# Patient Record
Sex: Male | Born: 1950
Health system: Southern US, Community
[De-identification: ages and names within clinical notes are randomized; demographics above are authoritative.]

## PROBLEM LIST (undated history)

## (undated) DIAGNOSIS — K912 Postsurgical malabsorption, not elsewhere classified: Secondary | ICD-10-CM

## (undated) DIAGNOSIS — A419 Sepsis, unspecified organism: Secondary | ICD-10-CM

## (undated) DIAGNOSIS — I509 Heart failure, unspecified: Secondary | ICD-10-CM

## (undated) DIAGNOSIS — I442 Atrioventricular block, complete: Secondary | ICD-10-CM

## (undated) DIAGNOSIS — J9610 Chronic respiratory failure, unspecified whether with hypoxia or hypercapnia: Secondary | ICD-10-CM

## (undated) DIAGNOSIS — M79604 Pain in right leg: Secondary | ICD-10-CM

## (undated) DIAGNOSIS — I4892 Unspecified atrial flutter: Secondary | ICD-10-CM

## (undated) DIAGNOSIS — L0390 Cellulitis, unspecified: Secondary | ICD-10-CM

## (undated) DIAGNOSIS — G4733 Obstructive sleep apnea (adult) (pediatric): Secondary | ICD-10-CM

## (undated) DIAGNOSIS — T8579XA Infection and inflammatory reaction due to other internal prosthetic devices, implants and grafts, initial encounter: Secondary | ICD-10-CM

## (undated) DIAGNOSIS — R0902 Hypoxemia: Secondary | ICD-10-CM

## (undated) DIAGNOSIS — E875 Hyperkalemia: Secondary | ICD-10-CM

## (undated) DIAGNOSIS — R5381 Other malaise: Secondary | ICD-10-CM

## (undated) DIAGNOSIS — K432 Incisional hernia without obstruction or gangrene: Secondary | ICD-10-CM

## (undated) DIAGNOSIS — N183 Chronic kidney disease, stage 3 (moderate): Secondary | ICD-10-CM

## (undated) DIAGNOSIS — R001 Bradycardia, unspecified: Secondary | ICD-10-CM

## (undated) DIAGNOSIS — K469 Unspecified abdominal hernia without obstruction or gangrene: Secondary | ICD-10-CM

## (undated) DIAGNOSIS — K5909 Other constipation: Secondary | ICD-10-CM

## (undated) DIAGNOSIS — K632 Fistula of intestine: Secondary | ICD-10-CM

## (undated) DIAGNOSIS — L0291 Cutaneous abscess, unspecified: Secondary | ICD-10-CM

## (undated) DIAGNOSIS — Z950 Presence of cardiac pacemaker: Secondary | ICD-10-CM

## (undated) DIAGNOSIS — Z7901 Long term (current) use of anticoagulants: Secondary | ICD-10-CM

## (undated) DIAGNOSIS — N19 Unspecified kidney failure: Secondary | ICD-10-CM

## (undated) DIAGNOSIS — I5042 Chronic combined systolic (congestive) and diastolic (congestive) heart failure: Secondary | ICD-10-CM

## (undated) DIAGNOSIS — E876 Hypokalemia: Secondary | ICD-10-CM

## (undated) DIAGNOSIS — I10 Essential (primary) hypertension: Secondary | ICD-10-CM

## (undated) DIAGNOSIS — N179 Acute kidney failure, unspecified: Secondary | ICD-10-CM

## (undated) DIAGNOSIS — L97429 Non-pressure chronic ulcer of left heel and midfoot with unspecified severity: Secondary | ICD-10-CM

## (undated) DIAGNOSIS — J9620 Acute and chronic respiratory failure, unspecified whether with hypoxia or hypercapnia: Secondary | ICD-10-CM

## (undated) DIAGNOSIS — E785 Hyperlipidemia, unspecified: Secondary | ICD-10-CM

## (undated) DIAGNOSIS — I4891 Unspecified atrial fibrillation: Secondary | ICD-10-CM

## (undated) DIAGNOSIS — L03116 Cellulitis of left lower limb: Secondary | ICD-10-CM

## (undated) DIAGNOSIS — G629 Polyneuropathy, unspecified: Secondary | ICD-10-CM

## (undated) DIAGNOSIS — N189 Chronic kidney disease, unspecified: Secondary | ICD-10-CM

## (undated) DIAGNOSIS — Z9114 Patient's other noncompliance with medication regimen: Secondary | ICD-10-CM

## (undated) DIAGNOSIS — J42 Unspecified chronic bronchitis: Secondary | ICD-10-CM

## (undated) DIAGNOSIS — I714 Abdominal aortic aneurysm, without rupture: Secondary | ICD-10-CM

## (undated) DIAGNOSIS — L89153 Pressure ulcer of sacral region, stage 3: Secondary | ICD-10-CM

## (undated) DIAGNOSIS — E119 Type 2 diabetes mellitus without complications: Secondary | ICD-10-CM

## (undated) DIAGNOSIS — J9621 Acute and chronic respiratory failure with hypoxia: Secondary | ICD-10-CM

## (undated) HISTORY — DX: Chronic kidney disease, unspecified: N18.9

## (undated) HISTORY — DX: Cutaneous abscess, unspecified: L02.91

## (undated) HISTORY — PX: APPENDECTOMY: SHX54

## (undated) HISTORY — DX: Pressure ulcer of sacral region, stage 3: L89.153

## (undated) HISTORY — DX: Incisional hernia without obstruction or gangrene: K43.2

## (undated) HISTORY — DX: Unspecified abdominal hernia without obstruction or gangrene: K46.9

## (undated) HISTORY — DX: Heart failure, unspecified: I50.9

## (undated) HISTORY — DX: Atrioventricular block, complete: I44.2

## (undated) HISTORY — PX: BOWEL RESECTION: SHX1257

## (undated) HISTORY — DX: Chronic respiratory failure, unspecified whether with hypoxia or hypercapnia: J96.10

## (undated) HISTORY — DX: Hypoxemia: R09.02

## (undated) HISTORY — DX: Cellulitis of left lower limb: L03.116

## (undated) HISTORY — DX: Obstructive sleep apnea (adult) (pediatric): G47.33

## (undated) HISTORY — DX: Acute and chronic respiratory failure, unspecified whether with hypoxia or hypercapnia: J96.20

## (undated) HISTORY — PX: TONSILLECTOMY AND ADENOIDECTOMY: SUR1326

## (undated) HISTORY — DX: Other constipation: K59.09

## (undated) HISTORY — DX: Chronic combined systolic (congestive) and diastolic (congestive) heart failure: I50.42

## (undated) HISTORY — DX: Polyneuropathy, unspecified: G62.9

## (undated) HISTORY — DX: Non-pressure chronic ulcer of left heel and midfoot with unspecified severity: L97.429

## (undated) HISTORY — DX: Postsurgical malabsorption, not elsewhere classified: K91.2

## (undated) HISTORY — DX: Bradycardia, unspecified: R00.1

## (undated) HISTORY — DX: Hyperkalemia: E87.5

## (undated) HISTORY — PX: NASAL SEPTUM SURGERY: SHX37

## (undated) HISTORY — DX: Morbid (severe) obesity due to excess calories: E66.01

## (undated) HISTORY — DX: Other malaise: R53.81

## (undated) HISTORY — PX: INCISIONAL HERNIA REPAIR: SHX193

## (undated) HISTORY — DX: Unspecified atrial flutter: I48.92

## (undated) HISTORY — DX: Type 2 diabetes mellitus without complications: E11.9

## (undated) HISTORY — PX: KNEE SURGERY: SHX244

## (undated) HISTORY — DX: Acute kidney failure, unspecified: N17.9

## (undated) HISTORY — DX: Long term (current) use of anticoagulants: Z79.01

## (undated) HISTORY — DX: Hypokalemia: E87.6

## (undated) HISTORY — DX: Essential (primary) hypertension: I10

## (undated) HISTORY — DX: Cellulitis, unspecified: L03.90

## (undated) HISTORY — PX: OSTOMY TAKE DOWN: SHX2142

## (undated) HISTORY — DX: Unspecified kidney failure: N19

## (undated) HISTORY — DX: Pain in right leg: M79.604

## (undated) HISTORY — DX: Acute and chronic respiratory failure with hypoxia: J96.21

## (undated) HISTORY — DX: Sepsis, unspecified organism: A41.9

## (undated) HISTORY — DX: Patient's other noncompliance with medication regimen: Z91.14

## (undated) HISTORY — DX: Chronic kidney disease, stage 3 (moderate): N18.3

## (undated) HISTORY — DX: Presence of cardiac pacemaker: Z95.0

## (undated) HISTORY — DX: Unspecified chronic bronchitis: J42

---

## 1999-01-09 ENCOUNTER — Encounter (HOSPITAL_BASED_OUTPATIENT_CLINIC_OR_DEPARTMENT_OTHER): Payer: Self-pay | Admitting: General Surgery

## 1999-01-10 ENCOUNTER — Inpatient Hospital Stay (HOSPITAL_COMMUNITY): Admission: RE | Admit: 1999-01-10 | Discharge: 1999-01-18 | Payer: Self-pay | Admitting: General Surgery

## 1999-01-13 ENCOUNTER — Encounter (HOSPITAL_BASED_OUTPATIENT_CLINIC_OR_DEPARTMENT_OTHER): Payer: Self-pay | Admitting: General Surgery

## 1999-07-02 ENCOUNTER — Emergency Department (HOSPITAL_COMMUNITY): Admission: EM | Admit: 1999-07-02 | Discharge: 1999-07-02 | Payer: Self-pay | Admitting: Emergency Medicine

## 1999-07-02 ENCOUNTER — Encounter: Payer: Self-pay | Admitting: Emergency Medicine

## 2000-08-25 ENCOUNTER — Encounter: Payer: Self-pay | Admitting: *Deleted

## 2000-08-25 ENCOUNTER — Emergency Department (HOSPITAL_COMMUNITY): Admission: EM | Admit: 2000-08-25 | Discharge: 2000-08-25 | Payer: Self-pay | Admitting: Emergency Medicine

## 2001-10-20 ENCOUNTER — Inpatient Hospital Stay (HOSPITAL_COMMUNITY): Admission: EM | Admit: 2001-10-20 | Discharge: 2001-10-23 | Payer: Self-pay | Admitting: Emergency Medicine

## 2001-10-20 ENCOUNTER — Encounter: Payer: Self-pay | Admitting: Emergency Medicine

## 2002-07-06 ENCOUNTER — Encounter: Payer: Self-pay | Admitting: Pulmonary Disease

## 2002-07-06 ENCOUNTER — Ambulatory Visit (HOSPITAL_COMMUNITY): Admission: RE | Admit: 2002-07-06 | Discharge: 2002-07-06 | Payer: Self-pay | Admitting: Pulmonary Disease

## 2002-12-16 ENCOUNTER — Ambulatory Visit (HOSPITAL_BASED_OUTPATIENT_CLINIC_OR_DEPARTMENT_OTHER): Admission: RE | Admit: 2002-12-16 | Discharge: 2002-12-16 | Payer: Self-pay | Admitting: Internal Medicine

## 2003-09-12 ENCOUNTER — Inpatient Hospital Stay (HOSPITAL_COMMUNITY): Admission: EM | Admit: 2003-09-12 | Discharge: 2003-09-16 | Payer: Self-pay | Admitting: Emergency Medicine

## 2003-09-13 ENCOUNTER — Encounter (INDEPENDENT_AMBULATORY_CARE_PROVIDER_SITE_OTHER): Payer: Self-pay | Admitting: Cardiology

## 2004-01-16 ENCOUNTER — Emergency Department (HOSPITAL_COMMUNITY): Admission: EM | Admit: 2004-01-16 | Discharge: 2004-01-16 | Payer: Self-pay | Admitting: Emergency Medicine

## 2005-01-27 ENCOUNTER — Inpatient Hospital Stay (HOSPITAL_COMMUNITY): Admission: AD | Admit: 2005-01-27 | Discharge: 2005-01-29 | Payer: Self-pay | Admitting: Emergency Medicine

## 2005-01-28 ENCOUNTER — Ambulatory Visit: Payer: Self-pay | Admitting: Internal Medicine

## 2005-08-02 ENCOUNTER — Encounter: Admission: RE | Admit: 2005-08-02 | Discharge: 2005-08-02 | Payer: Self-pay | Admitting: Endocrinology

## 2005-08-15 ENCOUNTER — Encounter: Admission: RE | Admit: 2005-08-15 | Discharge: 2005-09-17 | Payer: Self-pay | Admitting: Endocrinology

## 2005-09-18 ENCOUNTER — Encounter: Admission: RE | Admit: 2005-09-18 | Discharge: 2005-12-17 | Payer: Self-pay | Admitting: Endocrinology

## 2006-07-12 ENCOUNTER — Emergency Department (HOSPITAL_COMMUNITY): Admission: EM | Admit: 2006-07-12 | Discharge: 2006-07-12 | Payer: Self-pay | Admitting: Emergency Medicine

## 2007-06-09 ENCOUNTER — Inpatient Hospital Stay (HOSPITAL_COMMUNITY): Admission: EM | Admit: 2007-06-09 | Discharge: 2007-06-19 | Payer: Self-pay | Admitting: Emergency Medicine

## 2007-06-13 ENCOUNTER — Encounter: Payer: Self-pay | Admitting: Cardiology

## 2007-06-15 ENCOUNTER — Ambulatory Visit: Payer: Self-pay | Admitting: Internal Medicine

## 2007-06-18 HISTORY — PX: OTHER SURGICAL HISTORY: SHX169

## 2007-06-19 ENCOUNTER — Encounter: Payer: Self-pay | Admitting: Internal Medicine

## 2007-06-26 ENCOUNTER — Ambulatory Visit: Payer: Self-pay | Admitting: Pulmonary Disease

## 2007-07-06 ENCOUNTER — Ambulatory Visit (HOSPITAL_BASED_OUTPATIENT_CLINIC_OR_DEPARTMENT_OTHER): Admission: RE | Admit: 2007-07-06 | Discharge: 2007-07-06 | Payer: Self-pay | Admitting: Pulmonary Disease

## 2007-07-06 ENCOUNTER — Ambulatory Visit: Payer: Self-pay | Admitting: Pulmonary Disease

## 2007-09-01 DIAGNOSIS — Z794 Long term (current) use of insulin: Secondary | ICD-10-CM

## 2007-09-01 DIAGNOSIS — G4733 Obstructive sleep apnea (adult) (pediatric): Secondary | ICD-10-CM

## 2007-09-01 DIAGNOSIS — J42 Unspecified chronic bronchitis: Secondary | ICD-10-CM

## 2007-09-01 DIAGNOSIS — R0902 Hypoxemia: Secondary | ICD-10-CM | POA: Insufficient documentation

## 2007-09-01 DIAGNOSIS — E785 Hyperlipidemia, unspecified: Secondary | ICD-10-CM

## 2007-09-01 DIAGNOSIS — E1165 Type 2 diabetes mellitus with hyperglycemia: Secondary | ICD-10-CM

## 2007-09-01 DIAGNOSIS — E66813 Obesity, class 3: Secondary | ICD-10-CM | POA: Insufficient documentation

## 2007-09-01 DIAGNOSIS — I509 Heart failure, unspecified: Secondary | ICD-10-CM | POA: Insufficient documentation

## 2007-09-01 HISTORY — DX: Obstructive sleep apnea (adult) (pediatric): G47.33

## 2007-09-01 HISTORY — DX: Unspecified chronic bronchitis: J42

## 2007-09-01 HISTORY — DX: Morbid (severe) obesity due to excess calories: E66.01

## 2007-09-01 HISTORY — DX: Obesity, class 3: E66.813

## 2007-11-18 ENCOUNTER — Telehealth: Payer: Self-pay | Admitting: Pulmonary Disease

## 2008-01-02 ENCOUNTER — Emergency Department (HOSPITAL_COMMUNITY): Admission: EM | Admit: 2008-01-02 | Discharge: 2008-01-02 | Payer: Self-pay | Admitting: Emergency Medicine

## 2008-03-08 ENCOUNTER — Telehealth (INDEPENDENT_AMBULATORY_CARE_PROVIDER_SITE_OTHER): Payer: Self-pay | Admitting: *Deleted

## 2008-03-15 ENCOUNTER — Telehealth: Payer: Self-pay | Admitting: Pulmonary Disease

## 2008-04-28 ENCOUNTER — Ambulatory Visit: Payer: Self-pay | Admitting: Pulmonary Disease

## 2009-01-27 ENCOUNTER — Encounter (INDEPENDENT_AMBULATORY_CARE_PROVIDER_SITE_OTHER): Payer: Self-pay | Admitting: *Deleted

## 2009-05-26 ENCOUNTER — Encounter: Payer: Self-pay | Admitting: Pulmonary Disease

## 2009-06-06 ENCOUNTER — Encounter: Payer: Self-pay | Admitting: Pulmonary Disease

## 2009-10-04 ENCOUNTER — Encounter (INDEPENDENT_AMBULATORY_CARE_PROVIDER_SITE_OTHER): Payer: Self-pay | Admitting: *Deleted

## 2009-10-08 ENCOUNTER — Emergency Department (HOSPITAL_COMMUNITY): Admission: EM | Admit: 2009-10-08 | Discharge: 2009-10-08 | Payer: Self-pay | Admitting: Pediatric Emergency Medicine

## 2009-10-19 ENCOUNTER — Encounter: Admission: RE | Admit: 2009-10-19 | Discharge: 2009-10-19 | Payer: Self-pay | Admitting: Endocrinology

## 2009-12-12 ENCOUNTER — Ambulatory Visit (HOSPITAL_COMMUNITY): Admission: RE | Admit: 2009-12-12 | Discharge: 2009-12-12 | Payer: Self-pay | Admitting: Neurosurgery

## 2010-01-02 ENCOUNTER — Inpatient Hospital Stay (HOSPITAL_COMMUNITY): Admission: EM | Admit: 2010-01-02 | Discharge: 2010-01-04 | Payer: Self-pay | Admitting: Emergency Medicine

## 2010-03-01 ENCOUNTER — Encounter: Admission: RE | Admit: 2010-03-01 | Discharge: 2010-03-01 | Payer: Self-pay | Admitting: Orthopedic Surgery

## 2010-03-26 ENCOUNTER — Encounter (HOSPITAL_BASED_OUTPATIENT_CLINIC_OR_DEPARTMENT_OTHER): Admission: RE | Admit: 2010-03-26 | Discharge: 2010-06-24 | Payer: Self-pay | Admitting: General Surgery

## 2010-03-27 ENCOUNTER — Ambulatory Visit (HOSPITAL_COMMUNITY): Admission: RE | Admit: 2010-03-27 | Discharge: 2010-03-27 | Payer: Self-pay | Admitting: General Surgery

## 2010-04-13 ENCOUNTER — Ambulatory Visit: Payer: Self-pay | Admitting: Vascular Surgery

## 2010-05-07 ENCOUNTER — Encounter (INDEPENDENT_AMBULATORY_CARE_PROVIDER_SITE_OTHER): Payer: Self-pay | Admitting: *Deleted

## 2010-06-26 ENCOUNTER — Encounter (HOSPITAL_BASED_OUTPATIENT_CLINIC_OR_DEPARTMENT_OTHER)
Admission: RE | Admit: 2010-06-26 | Discharge: 2010-08-24 | Payer: Self-pay | Source: Home / Self Care | Admitting: General Surgery

## 2010-10-28 ENCOUNTER — Encounter: Payer: Self-pay | Admitting: Neurosurgery

## 2010-11-06 NOTE — Letter (Signed)
Summary: Device-Delinquent Check  [place], [place] [address] [place]   [place], [place] [address]   Phone: [phone]  Fax: [phone]     May 07, 2010 MRN: [id number]   Kurt Cross [address] [place],   [address]   [name],  According to our records, you have not had your implanted device checked in the recommended period of time.  We are unable to determine appropriate device function without checking your device on a regular basis.  Please call our office to schedule an appointment as soon as possible.  If you are having your device checked by another physician, please call us so that we may update our records.  Thank you,   [facility]

## 2010-11-06 NOTE — Letter (Signed)
Summary: Device-Delinquent Check  [facility], Main Office  [address]   [place], [place] [address]   Phone: [phone]  Fax: [phone]     October 04, 2009 MRN: [id number]   KHUP MERRIWEATHER [address] [place], [place]  [address]   Dear Mr. RIDENHOUR,  According to our records, you have not had your implanted device checked in the recommended period of time.  We are unable to determine appropriate device function without checking your device on a regular basis.  Please call our office to schedule an appointment as soon as possible.  If you are having your device checked by another physician, please call us so that we may update our records.  Thank you,   [facility]

## 2010-11-06 NOTE — Miscellaneous (Signed)
Summary: Order for Respiratory Eval/[facility]  Order for Respiratory Eval/[facility]   Imported By: Sherian Rein 05/29/2009 13:26:33  _____________________________________________________________________  External Attachment:    Type:   Image     Comment:   External Document

## 2010-11-06 NOTE — Progress Notes (Signed)
Summary: medicare beneficiary statement- requested chart.  Phone Note From Other Clinic   Caller: [facility] Call For: sood Summary of Call: caller has been trying to get a letter for " medicare benificiary statement" signed since 2 / 09. says the letter is either in chart or a nurse may have it. however, she is faxing a new request/ form to be signed asap. [phone]. Initial call taken by: Tivis Ringer,  March 08, 2008 2:28 PM  Follow-up for Phone Call        requested chart. Cyndia Diver LPN  March 09, 4539 2:37 PM   Additional Follow-up for Phone Call Additional follow up Details #1::        Chart was placed in sood lookat with paperwork this am.  Additional Follow-up by: Vernie Murders,  March 09, 2008 9:45 AM

## 2010-11-06 NOTE — Assessment & Plan Note (Signed)
Summary: go over sleep study/apc   Visit Type:  Follow-up PCP:  Kurt Cross  Chief Complaint:  Sleep study results .  History of Present Illness: I saw Kurt Cross in follow up for his Severe OSA.  He is currently on BiPAP at 16/12 with a full face mask.  He feels this helps alot with his sleep.  He has increased energy during the day.  He has lost 40 lbs since starting an exercise program.  He recently had a pacemaker inserted.      Current Allergies: No known allergies   Past Medical History:    DM    HTN    CHF    s/p Pacemaker    Severe OSA, on BiPAP 16/12    Morbid obesity    Dyslipidemia  Past Surgical History:    Exploratory lap for colon perforation    Rhinoplasty    Appendectomy    Ventral hernia repair   Family History:    Mother - ETOH    Daughter - Allergies  Social History:    Married.  Disabled.  Quit drinking in 2007.  Quit smoking in 2008.  Has 7 children.     Vital Signs:  Patient Profile:   60 Years Old Male Height:     70 inches Weight:      333.13 pounds O2 Sat:      95 % O2 treatment:    Room Air Temp:     98.1 degrees F oral Pulse rate:   76 / minute BP sitting:   122 / 80  (left arm) Cuff size:   regular  Vitals Entered ByMarinus Cross (April 28, 2008 3:05 PM)             Is Patient Diabetic? Yes  Comments Medications reviewed with patient Kurt Cross  April 28, 2008 3:09 PM      Physical Exam  General:     obese.  obese.   Nose:     no sinus tenderness Mouth:     no oral lesion, poor dentition Neck:     no LAN Lungs:     decreased breath sounds, no wheezing Heart:     regular rhythm, normal rate, and no murmurs.  regular rhythm, normal rate, and no murmurs.   Abdomen:     obese, soft Extremities:     no edema    Pulmonary Function Test Height (in.): 70 Gender: Male    Impression & Recommendations:  Problem # 1:  OBSTRUCTIVE SLEEP APNEA (ICD-327.23) Continue on BiPAP 16/12.  I will get  a copy of [facility] download from [facility]. Orders: DME Referral (DME) Est. Patient Level II ([address])   Medications Added to Medication List This Visit: 1)  Furosemide 40 Mg Tabs (Furosemide) .... Take one tablet two times a day 2)  Norvasc 10 Mg Tabs (Amlodipine besylate) .... Take one tablet daily. 3)  Klor-con M20 20 Meq Cr-tabs (Potassium chloride crys cr) .... Take one tablet daily.  Complete Medication List: 1)  Furosemide 40 Mg Tabs (Furosemide) .... Take one tablet two times a day 2)  Clonidine Hcl 0.2 Mg Tabs (Clonidine hcl) .... Take 1/2 tab by mouth two times a day 3)  Aldactone 100 Mg Tabs (Spironolactone) .... Take by mouth once daily 4)  Hydralazine Hcl 25 Mg Tabs (Hydralazine hcl) .... Take one tab by mouth three times a day 5)  Coreg 6.25 Mg Tabs (Carvedilol) .... Take one tab by mouth two times a day  6)  Lotensin 40 Mg Tabs (Benazepril hcl) .... Take by mouth once daily 7)  Crestor 10 Mg Tabs (Rosuvastatin calcium) .... Take by mouth once daily 8)  Novolog 100 Unit/ml Soln (Insulin aspart) .... Use as directed 9)  Humulin N 100 Unit/ml Susp (Insulin isophane human) .... Use as directed 10)  Norvasc 10 Mg Tabs (Amlodipine besylate) .... Take one tablet daily. 11)  Klor-con M20 20 Meq Cr-tabs (Potassium chloride crys cr) .... Take one tablet daily.   Patient Instructions: 1)  Follow up in 6 months   ]

## 2010-12-30 LAB — UIFE/LIGHT CHAINS/TP QN, 24-HR UR
Alpha 1, Urine: DETECTED — AB
Alpha 2, Urine: DETECTED — AB
Beta, Urine: DETECTED — AB
Free Kappa Lt Chains,Ur: 9.95 mg/dL — ABNORMAL HIGH (ref 0.04–1.51)
Free Kappa/Lambda Ratio: 15.31 ratio — ABNORMAL HIGH (ref 0.46–4.00)
Free Lt Chn Excr Rate: 248.75 mg/d

## 2010-12-30 LAB — DIFFERENTIAL
Basophils Absolute: 0.3 10*3/uL — ABNORMAL HIGH (ref 0.0–0.1)
Basophils Relative: 2 % — ABNORMAL HIGH (ref 0–1)
Eosinophils Relative: 1 % (ref 0–5)
Eosinophils Relative: 2 % (ref 0–5)
Lymphocytes Relative: 23 % (ref 12–46)
Lymphocytes Relative: 23 % (ref 12–46)
Lymphs Abs: 2.2 10*3/uL (ref 0.7–4.0)
Lymphs Abs: 2.4 10*3/uL (ref 0.7–4.0)
Monocytes Absolute: 1 10*3/uL (ref 0.1–1.0)
Neutrophils Relative %: 66 % (ref 43–77)

## 2010-12-30 LAB — COMPREHENSIVE METABOLIC PANEL
AST: 14 U/L (ref 0–37)
AST: 17 U/L (ref 0–37)
Albumin: 3.3 g/dL — ABNORMAL LOW (ref 3.5–5.2)
Alkaline Phosphatase: 20 U/L — ABNORMAL LOW (ref 39–117)
CO2: 27 mEq/L (ref 19–32)
Calcium: 8.8 mg/dL (ref 8.4–10.5)
Chloride: 100 mEq/L (ref 96–112)
Creatinine, Ser: 1.43 mg/dL (ref 0.4–1.5)
GFR calc Af Amer: 56 mL/min — ABNORMAL LOW (ref >60)
GFR calc Af Amer: >60 mL/min (ref >60)
GFR calc non Af Amer: 51 mL/min — ABNORMAL LOW (ref >60)
Potassium: 4.7 mEq/L (ref 3.5–5.1)
Total Bilirubin: 0.9 mg/dL (ref 0.3–1.2)

## 2010-12-30 LAB — GLUCOSE, CAPILLARY
Glucose-Capillary: 149 mg/dL — ABNORMAL HIGH (ref 70–99)
Glucose-Capillary: 152 mg/dL — ABNORMAL HIGH (ref 70–99)
Glucose-Capillary: 159 mg/dL — ABNORMAL HIGH (ref 70–99)
Glucose-Capillary: 185 mg/dL — ABNORMAL HIGH (ref 70–99)
Glucose-Capillary: 299 mg/dL — ABNORMAL HIGH (ref 70–99)

## 2010-12-30 LAB — SEDIMENTATION RATE: Sed Rate: 39 mm/hr — ABNORMAL HIGH (ref 0–16)

## 2010-12-30 LAB — CBC
HCT: 46.3 % (ref 39.0–52.0)
Hemoglobin: 16.1 g/dL (ref 13.0–17.0)
MCHC: 33.9 g/dL (ref 30.0–36.0)
MCV: 100.1 fL — ABNORMAL HIGH (ref 78.0–100.0)
MCV: 101.4 fL — ABNORMAL HIGH (ref 78.0–100.0)
RBC: 4.29 MIL/uL (ref 4.22–5.81)
RDW: 13.5 % (ref 11.5–15.5)

## 2010-12-30 LAB — HEMOGLOBINOPATHY EVALUATION
Hemoglobin Other: 0 % (ref 0.0–0.0)
Hgb A2 Quant: 2.6 % (ref 2.2–3.2)
Hgb A: 97.4 % (ref 96.8–97.8)

## 2010-12-30 LAB — POCT I-STAT, CHEM 8
Chloride: 105 mEq/L (ref 96–112)
HCT: 50 % (ref 39.0–52.0)
Hemoglobin: 17 g/dL (ref 13.0–17.0)
Potassium: 4 mEq/L (ref 3.5–5.1)
Sodium: 138 mEq/L (ref 135–145)

## 2010-12-30 LAB — HEPATIC FUNCTION PANEL
Albumin: 3.4 g/dL — ABNORMAL LOW (ref 3.5–5.2)
Alkaline Phosphatase: 30 U/L — ABNORMAL LOW (ref 39–117)
Indirect Bilirubin: 0.9 mg/dL (ref 0.3–0.9)
Total Protein: 7.3 g/dL (ref 6.0–8.3)

## 2010-12-30 LAB — VITAMIN B12: Vitamin B-12: 273 pg/mL (ref 211–911)

## 2010-12-30 LAB — ANA: Anti Nuclear Antibody(ANA): NEGATIVE

## 2010-12-30 LAB — T4, FREE: Free T4: 0.92 ng/dL (ref 0.80–1.80)

## 2010-12-30 LAB — HEMOGLOBIN A1C
Hgb A1c MFr Bld: 7.6 % — ABNORMAL HIGH (ref 4.6–6.1)
Mean Plasma Glucose: 171 mg/dL

## 2010-12-30 LAB — SODIUM, URINE, RANDOM: Sodium, Ur: 119 mEq/L

## 2010-12-30 LAB — PHOSPHORUS: Phosphorus: 4.2 mg/dL (ref 2.3–4.6)

## 2010-12-30 LAB — FOLATE: Folate: 6.3 ng/mL

## 2010-12-30 LAB — MAGNESIUM: Magnesium: 1.8 mg/dL (ref 1.5–2.5)

## 2011-02-19 NOTE — Op Note (Signed)
NAMESPARROW, UHLENHAKE NO.:  [id number]   MEDICAL RECORD NO.:  [id number]          PATIENT TYPE:  INP   LOCATION:  3732                         FACILITY:  [facility]   PHYSICIAN:  Champ Mungo. Lovena Le, MD    DATE OF BIRTH:  01-22-1951   DATE OF PROCEDURE:  06/18/2007  DATE OF DISCHARGE:                               OPERATIVE REPORT   PROCEDURE PERFORMED:  Implantation of a biventricular pacemaker.   INDICATIONS:  Nonischemic cardiomyopathy, EF 35-40%, congestive heart  failure with intermittent complete heart block.   INTRODUCTION:  The patient is 60 year old man with history of COPD and  sleep apnea as well as a history of intermittent complete heart block as  well as a history of congestive heart failure with an EF of 35-40%.  He  is admitted to hospital and found to have symptomatic intermittent heart  block.  He is now referred for bi-V pacemaker implantation.   PROCEDURE:  After informed consent obtained, the patient taken  diagnostic EP lab in fasting state.  After usual preparation and  draping, intravenous fentanyl and Midazolam was given for sedation.  30  mL lidocaine was infiltrated over the infraclavicular region.  A 7 cm  incision was carried out over this region.  Electrocautery utilized to  dissect down to the fascial plane.  The left subclavian vein was sharply  punctured x3 and the Medtronic model 5076 58-cm active fixation pacing  lead serial [id number] was advanced into the right ventricle.  The  Medtronic model 5076 52-cm active fixation pacing lead serial number  [id number] advanced to the right atrium.  Mapping was carried out the  right ventricle.  At the final site on the RV septum the R-waves  measured 21 mV and the pacing threshold 0.6 volts at 0.5 milliseconds  with the lead actively fixed.  Pace impedance was 811 ohms.  10 volts  pacing did not stimulate diaphragm.  With this satisfactory parameter  the attention then turned placement atrial  lead which was placed in  anterolateral wall the right atrium where P-waves were 2 mV and the  pacing threshold was 1.2 volts of 0.5 milliseconds.  Again 10 volts  pacing did not stimulate the diaphragm.  The pacing impedance was 733  ohms.  With these satisfactory parameters, attention then turned  placement the left ventricular lead.  Coronary sinus guiding catheter  was advanced into the right atrium and the coronary sinus was cannulated  without difficulty.  Venography was carried out demonstrating a nice  lateral vein which was used for LV lead placement.  The Medtronic model  4194 88-cm bipolar lead serial number [id number] was advanced into the  lateral vein where the pacing threshold was to 2.5 volt at 0.5  milliseconds.  The pace impedance 854 ohms and there was no  diaphragmatic stimulation noted.  After this was confirmed the leads  were secured to subpectoralis fascia with a figure-of-eight silk suture.  The sewing sleeve also secured with silk suture.  Electrocautery  utilized to make subcutaneous pocket.  Kanamycin irrigation was utilized  to irrigate  the pocket.  Electrocautery utilized to assure hemostasis.  The Medtronic Insync III model [id number] dual-chamber biventricular pacemaker  serial number [id number] was connected to the atrium.  Atrial and  ventricular leads were placed back in the subcutaneous pocket.  Generator secured with silk suture.  Additional kanamycin was then  utilized to irrigate the pocket.  The incision closed with layer of 2-0  Vicryl followed by layer of 3-0 Vicryl followed by layer of 4-0 Vicryl.  Benzoin painted on the skin, Steri-Strips were applied and pressure  dressing placed.  The patient was returned to his room in satisfactory  condition.   COMPLICATIONS:  There were no immediate procedure complications.   RESULTS:  Demonstrate successful implantation of Medtronic dual-chamber  pacemaker in a patient with intermittent complete heart block,  dilated  cardiomyopathy, EF 35-40% and congestive heart failure.      Champ Mungo. Lovena Le, MD  Electronically Signed     GWT/MEDQ  D:  06/18/2007  T:  06/19/2007  Job:  [id number]   cc:   Elayne Snare, M.D.

## 2011-02-19 NOTE — Procedures (Signed)
NAME:  Kurt Cross, Kurt Cross NO.:  [id number]   MEDICAL RECORD NO.:  [id number]          PATIENT TYPE:  OUT   LOCATION:  SLEEP CENTER                 FACILITY:  [facility]   PHYSICIAN:  Chesley Mires, MD        DATE OF BIRTH:  12-23-1950   DATE OF STUDY:                            NOCTURNAL POLYSOMNOGRAM   REFERRING PHYSICIAN:   REFERRING PHYSICIAN:  Chesley Mires, MD.   INDICATION:  This is an individual, who has a history of sleep apnea  with an overnight polysomnogram from December 16, 2002, showing an  apnea/hypopnea index of 72 with an oxygen saturation nadir of 70%.  He  has been tried on CPAP but was unable to tolerate this.  He returned to  the Sleep Lab for a BiPAP titration study.   EPWORTH SLEEPINESS SCORE:  Twenty.   MEDICATIONS:  Insulin, Spironolactone, Carvedilol, Hydralazine,  Furosemide, and Benazepril.   SLEEP ARCHITECTURE:  Total recording time was 386 minutes.  Total sleep  time was 313 minutes.  Sleep efficiency is 81%.  Sleep latency is 2.5  minutes, which is significantly reduced.  REM latency is 56 minutes.  The study was notable for lack of slow-wave sleep.  The patient slept in  both the supine and non-supine positions.   RESPIRATORY DATA:  The average respiratory rate was 18.  The patient was  titrated from a BiPAP pressure setting of 8/4 to 16/12.  At a BiPAP  pressure setting of 16/12, the apnea/hypopnea index was 0.  At this  pressure setting, snoring was eliminated and the patient was observed in  REM sleep; however, he was not observed in supine sleep.  Of note is  that at lower pressure settings on his BiPAP, he continued to have  significant mild respiratory events, which were both central obstructive  and mixed while in the supine position but not in the non-supine  position.   OXYGEN DATA:  The baseline oxygenation was 72%, the oxygen saturation  nadir was 79%.  At a BiPAP pressure setting of 16/12, the mean  oxygenation during non-REM  sleep was 93%, the mean oxygenation during  REM sleep was 92%, the minimal oxygenation during non-REM sleep was 89%,  and the minimal oxygenation during REM sleep was 89%.  The study was  done without the use of supplemental oxygen.   CARDIAC DATA:  The average heart rate was 76, and the rhythm strips  showed normal sinus rhythm with PVCs.   MOVEMENT-PARASOMNIA:  The periodic limb movement index was 0.  The  patient had one restroom trip.   IMPRESSION:  This was a bilevel positive airway pressure (BiPAP)  titration study.  At a bilevel positive airway pressure setting of  16/12, the apnea/hypopnea index was reduced to 0.  However, while the  patient was observed in rapid eye movement (REM) sleep at this pressure  setting, he was not observed in supine sleep.  Of note is that at lower  pressure settings, he continued to have a significant number of  respiratory events while in the supine position.  What I would recommend  is to set the patient  on bilevel positive airway pressure of 16/12 then  monitor him for his clinical response.  He may also benefit from the use  of positional therapy.  If he is still symptomatic after this, he may  need to undergo further titration study.  In addition, I would have the  patient undergo an overnight oximetry once he is established on bilevel  positive airway pressure to determine whether he would benefit from  supplemental oxygen as well.  __________      Chesley Mires, MD  Diplomat, American Board of Sleep Medicine  Electronically Signed     VS/MEDQ  D:  07/07/2007 16:19:34  T:  07/07/2007 22:35:18  Job:  [id number]

## 2011-02-19 NOTE — Op Note (Signed)
NAMELUCIAN, Cross NO.:  [id number]   MEDICAL RECORD NO.:  [id number]          PATIENT TYPE:  INP   LOCATION:  18                         FACILITY:  [facility]   PHYSICIAN:  Mohan N. Terrence Cross, M.D. DATE OF BIRTH:  March 21, 1951   DATE OF PROCEDURE:  06/15/2007  DATE OF DISCHARGE:                               OPERATIVE REPORT   PROCEDURE:  Left cardiac cath with selective left and right coronary  angiography, left ventriculography via right groin using Judkins  technique.   INDICATION FOR THE PROCEDURE:  [name] is a 60 year old black  male with past medical history significant for hypertension, insulin-  requiring diabetes mellitus, history of congestive heart failure,  obstructive sleep apnea, obesity-hypoventilation syndrome, morbid  obesity.  He was admitted on June 09, 2007 because of progressive  increasing shortness of breath associated with abdominal and leg  swelling for two weeks' duration.  Patient denies any chest pain,  nausea, vomiting or diaphoresis.  Denies noncompliance to medication or  diet.  Denies excessive salty food intake.  Patient does give a history  of PND, orthopnea.  Patient denies any palpitations, lightheadedness or  syncope.  Patient also complains of cough with brownish phlegm for last  few days.  Denies any fever or chills.  Denies any urinary complaints.  Patient was noted to be in mild congestive heart failure, responded to  IV diuretics.  Cardiology consult is called, as patient had  asymptomatic, second-degree 2:1 AV block and first-degree AV block and  for workup for CHF.   PAST MEDICAL HISTORY:  As above.   PAST SURGICAL HISTORY:  1. He had ventral hernia repair.  2. Had appendectomy in the past.  3. Had tonsillectomy in the past.  4. Had rhinoplasty in the past.   ALLERGIES:  No known drug allergies.   MEDICATIONS AT HOME:  He was on:  1. Clonidine 0.25 mg b.i.d.  2. Cardura 4 mg p.o. h.s.  3. Lasix 80  mg p.o. daily.  4. Crestor 10 mg p.o. daily.  5. Humulin N insulin 72 units in the morning, 62 in the evening.  6. NovoLog 12 in the morning and 12 in the evening.   SOCIAL HISTORY:  He is married, retired on disability, used to build gas  pumps, smoked two packs per day for six to seven years and then half-  pack per day for two years, quit one month ago, no history of alcohol  abuse.   FAMILY HISTORY:  Father died at young age, cause not known.  Mother died  of diabetic coma; she also had history of  alcohol abuse.   PHYSICAL EXAMINATION:  GENERAL:  He was alert, awake, oriented x3, no  acute distress,  VITAL SIGNS:  Blood pressure was 155/97, pulse was 89 regular.  HEENT:  Conjunctiva was pink.  NECK:  Supple.  No JVD.  LUNGS:  Decreased breath sounds at bases with occasional rhonchi and  rales.  CARDIOVASCULAR EXAM:  S1-S2 was normal.  There was a soft systolic  murmur and S3 gallop.  ABDOMEN:  Soft.  Bowel sounds were present.  Obese, nontender.  EXTREMITIES:  There was no clubbing, cyanosis.  There is 1+ edema.   LABS:  His cardiac enzymes were negative.   EKG showed normal sinus rhythm with first-degree AV block, nondiagnostic  Q waves in the inferior leads, poor R wave progression in V1-V4.   BRIEF HOSPITAL COURSE:  Patient was admitted to telemetry unit.  MI was  ruled out by serial enzymes and EKG.  Patient subsequently underwent  Persantine Myoview which showed anteroapical wall ischemia with EF of  45% and fixed defect in the inferior wall, most likely due to  diaphragmatic attenuation.  Due to recurrent congestive heart failure,  depressed LV systolic function, multiple risk factors and mildly  positive Persantine Myoview, discussed with patient regarding left cath,  possible PTCA and stenting, its risks and benefits, i.e. death, MI,  stroke, need for emergency CABG, risk of restenosis, local vascular  complications, etc. and consented for PCI.   PROCEDURE:   After obtaining the informed consent, patient was brought to  the cath lab and was placed on fluoroscopy table.  The right groin was  prepped and draped in usual fashion, 2% Xylocaine was used for local  anesthesia in the right groin.  With the help of thin-wall needle, 6-  French arterial sheath was placed.  The sheath was aspirated and  flushed.   A 6-French left Judkins catheter was advanced over the wire under  fluoroscopic guidance up to the ascending aorta; wire was pulled out;  the cath was aspirated and connected to the manifold; cath was further  advanced and engaged into left coronary ostium.  Multiple views of the  left system were taken.   The catheter was disengaged and was pulled out over the wire and was  replaced with a 6-French right Judkins catheter which was advanced over  the wire under fluoroscopic guidance up to the ascending aorta; wire was  pulled out; the cath was aspirated and connected to the manifold;  catheter was further advanced and engaged into right coronary ostium.  Multiple views of the right system were taken.   The catheter was disengaged and was pulled out over the wire and was  replaced with a 6-French catheter which was advanced over the wire under  fluoroscopic guidance up to the ascending aorta; wire was pulled out;  the catheter was aspirated and connected to the manifold; catheter was  further advanced across the aortic valve into the LV.  LV pressures were  recorded.   Left ventriculography was done in 30-degree RAO position.  Post  angiographic pressures were recorded from LV and then pullback pressures  were recorded from the aorta.  There was no gradient across the aortic  valve.   The pigtail catheter was pulled out over the wires; sheaths were  aspirated and flushed.   FINDINGS:  1. Left ventricle was mildly enlarged.  2. Mild left ventricular hypertrophy.  3. Ejection fraction of 35 to 40%.  4. Left main was long which was  patent.  5. LAD was patent.  6. Diagonal 1 was moderate size which has 40% ostial stenosis.  7. Left circumflex was patent.  8. OM1 was very small which was patent.  9. OM2 was moderate size which was patent.  10.RCA was large which was patent.  11.PDA was moderate size which was patent.  12.PLA branches was small which has mild disease.   The patient tolerated procedure well; there were no complications.  Patient was  transferred to recovery room in stable condition.      Kurt Cross. Terrence Cross, M.D.  Electronically Signed     MNH/MEDQ  D:  06/15/2007  T:  06/15/2007  Job:  [id number]   cc:   Elayne Snare, M.D.  Cath Lab

## 2011-02-19 NOTE — Discharge Summary (Signed)
Kurt Cross, PRIBBLE NO.:  [id number]   MEDICAL RECORD NO.:  [id number]          PATIENT TYPE:  INP   LOCATION:  [place]                         FACILITY:  [facility]   PHYSICIAN:  Elayne Snare, M.D.       DATE OF BIRTH:  12/08/1950   DATE OF ADMISSION:  06/09/2007  DATE OF DISCHARGE:  06/15/2007                               DISCHARGE SUMMARY   HISTORY:  Patient was admitted with shortness of breath that was much  worse on the day of admission, along with some leg edema, as well as  cough and greenish sputum.  The patient has known history of recurrent  bronchitis and CHF in the past and has known diabetes and hypertension.  His medication, his past history, review of systems, see HPI.   PHYSICAL EXAMINATION:  VITAL SIGNS:  Blood pressure 150/100, heart rate  96.  HEENT:  His face was puffy.  EXTREMITIES:  No pedal edema present at the time of the exam.   His BNP was 142, potassium was low at 3, and chest x-Nowling showed  bilateral pulmonary edema.   HOSPITAL COURSE:  Patient was started on IV Lasix.  His blood pressure  medications were maximized and insulin adjusted.  Potassium was  supplemented.  Dr. Terrence Dupont was consulted because of his CHF and episodes  of heart block, which he felt was only a second-degree heart block.  Echocardiogram and Persantine Myoview stress study was ordered.  The  latter showed reversible ischemia and cardiac catheterization was  performed on September 8.  This showed no significant coronary artery  disease, and his ejection fraction was 35-40% with LVH.   The patient continued to diurese and lose weight, and he was also  treated with nebulizers and Avelox for his bronchitis; however, the  patient was unable to be weaned off of his oxygen.   At the time of discharge, the patient's blood pressure had improved  significantly.  He was still having occasional transient asymptomatic  episodes of second-degree heart block, and Dr. Zenia Resides  opinion was  pending at the time of dictation.   DISCHARGE DIAGNOSES:  1. Acute pulmonary edema.  2. Asthmatic bronchitis with acute exacerbation.  3. Chronic hypoxia secondary to above and severe obesity.  4. Severe hypertension.  5. Diabetes, poorly controlled.  6. Hypokalemia.   DISCHARGE CONDITION:  Improved.   SIGNIFICANT LABORATORY:  At the time of discharge, his creatinine was  1.4 and potassium 4.4.  White blood cell count 11,900 on admission, and  on September 4 it was 10.6.  Cardiac enzymes were negative.  Albumin  3.0.   Chest x-Sian showed bilateral pulmonary edema and final x-Arney showed  improving CHF with persistent pleural effusion and atelectasis or  infiltrate in the left lower lobe.   Echocardiogram report not available on the chart, but apparently had  showed no valvular disease with decreased ejection fraction.   DISCHARGE INSTRUCTIONS:  The patient will be on a low sodium,  carbohydrate modified low-fat diet.  He will have oxygen at home at 3  liters per minute.  He  will continue his CPAP treatment.   MEDICATIONS:  He will be on Lotensin 20 mg b.i.d., amlodipine 10 mg  daily, Zocor 40 mg daily, Avapro 300 mg daily which will be changed to  Benicar, spironolactone 100 mg daily, hydralazine 25 mg t.i.d.,  furosemide 40 mg daily, insulin NPH 46 in the morning of 30 in the  evening, NovoLog insulin 8 units at breakfast and 10 units at supper  time.   The patient to monitor blood sugar 4 times a day and follow up in the  office in 2 weeks and with Dr. Terrence Dupont as instructed.      Elayne Snare, M.D.  Electronically Signed     AK/MEDQ  D:  06/16/2007  T:  06/16/2007  Job:  [id number]   cc:   Allegra Lai. Terrence Dupont, M.D.

## 2011-02-19 NOTE — Procedures (Signed)
DUPLEX DEEP VENOUS EXAM - LOWER EXTREMITY   INDICATION:  Right ulcer on calf.   HISTORY:  Edema:  No.  Trauma/Surgery:  Right ulcer on calf.  Pain:  Yes.  PE:  No.  Previous DVT:  No.  Anticoagulants:  No.  Other:   DUPLEX EXAM:                CFV   SFV   PopV  PTV    GSV                R  L  R  L  R  L  R   L  R  L  Thrombosis    o  o  o  o  o  o  o   o  o  o  Spontaneous   +  +  +  +  +  +  +   +  +  +  Phasic        +  +  +  +  +  +  +   +  +  +  Augmentation  +  +  +  +  +  +  +   +  +  +  Compressible  +  +  +  +  +  +  +   +  +  +  Competent     +  +  +  +  +  +  +   +  +  +   Legend:  + - yes  o - no  p - partial  D - decreased   IMPRESSION:  All veins imaged appear compressible and free from deep  vein thrombus.  There is no evidence of venous insufficiency.    _____________________________  Rosetta Posner, M.D.   CB/MEDQ  D:  04/16/2010  T:  04/16/2010  Job:  [id number]

## 2011-02-19 NOTE — Assessment & Plan Note (Signed)
[facility]                             PULMONARY OFFICE NOTE   Kurt Cross, Kurt Cross                        MRN:          [id number]  DATE:06/26/2007                            DOB:          1951/08/22    PRIMARY CARE PHYSICIAN:  Kurt Cross, M.D.   I met Kurt Cross with his wife for evaluation of his sleep apnea.   He had previously been seen by Dr. Christinia Cross for hypoxemia and  dyspnea and at that time was felt to have obesity hypoventilation  syndrome with an obstructive sleep apnea.  He had an overnight  polysomnogram done on December 16, 2002, and was found to have severe  obstructive sleep apnea with an apnea/hypopnea index of 72 and oxygen  saturation nadir of 78%.  He was then started on CPAP at 10-cmH2O with  supplemental oxygen.  He was given a full face mask and humidifier.  He  had previously used advanced home care.  He says that his machine broke  about a year ago and he has not used CPAP since then until he was  recently hospitalized and he says during his hospitalization, he was put  on CPAP again and he felt that he had not slept that good in years.  He  is still currently on oxygen at 2.5 liters which he uses at night.  He  had rhinoplasty done several years ago and said that his snoring  improved somewhat after that but that is has been getting worse again  now.   His current sleep pattern is he goes to bed at around 9:30, although he  will often times fall asleep before this watching TV.  He will sleep  through till about 2 in the morning and then wake up.  He will stay  awake watching TV until about 8 in the morning and then he will go back  to sleep for about another hour or two.  He is able to stay awake for  the rest of the day but still feels tired.  He does snore at night as  well as wakes up with a choking sensation and his wife says that she has  seen him stop breathing while he is asleep.  He also wakes up feeling  sweaty.   He had noticed that recently he has been having more frequent  and vivid dreams and he does talk in his sleep at night.  He is not  using anything to help him fall asleep at night or stay awake during the  day.   There is no history of restless leg syndrome.  He denies any history of  bruxism.  There is no history of sleep hallucinations, sleep paralysis,  or cataplexy.   PAST MEDICAL HISTORY:  Significant for:  1. Congestive heart failure with an echocardiogram from June 11, 2007, showing an ejection fraction of 45%.  Mobitz type II AV block      status post pacemaker insertion.  2. Diabetes.  3. Elevated cholesterol.  4. Obstructive sleep apnea.  5.  Colon perforation, status post laparotomy with ventral hernia.   PAST SURGICAL HISTORY:  Significant for:  1. Rhinoplasty.  2. Appendectomy.   CURRENT MEDICATIONS:  1. Lasix 80 mg daily.  2. Spironolactone 100 mg daily.  3. Hydralazine 25 mg t.i.d.  4. Carvedilol 6.25 mg b.i.d.  5. Benazepril 40 mg daily.  6. Crestor 10 mg daily.  7. Humulin insulin as directed.  8. NovoLog as directed.   He has no known drug allergies.   SOCIAL HISTORY:  He is married.  He is on disability.  He quit drinking  a year ago.  He quit smoking 3 weeks ago and used to smoke 1-1.5 packs  of cigarettes a day.  He has 7 children.   FAMILY HISTORY:  Significant for his mother with alcoholism.  He has a  daughter who has eczema and allergies.   REVIEW OF SYSTEMS:  He says that his breathing symptoms have improved  since he stopped smoking but before this he was having problems with  cough, wheezing, and sputum production.  He says he has lost  approximately 20 pounds since being discharged form the hospital.   PHYSICAL EXAMINATION:  VITAL SIGNS:  He is 5 feet 10 inches tall, 345  pounds, temperature is 98.4, blood pressure is 114/74, heart rate is 85,  oxygen saturation is 95% on room air.  HEENT:  Pupils reactive.  There is no sinus  tenderness.  He has  prominent nasal turbinates.  He has a Mallampati III airway with  decreased AP diameter of the oropharynx and poor oral dentition.  There  is no lymphadenopathy.  HEART:  S1 S2.  CHEST:  There was no wheezing or rales.  ABDOMEN:  Obese, soft, nontender.  EXTREMITIES:  There was 1 plus ankle edema.  No cyanosis or clubbing.  NEUROLOGIC:  No focal deficits were appreciated.   IMPRESSION:  1. Severe obstructive sleep apnea as demonstrated by his previous      overnight polysomnogram showing an apnea/hypopnea index of 72 with      an oxygen saturation nadir of 70%.  I have reviewed the results of      his sleep study with him.  I had emphasized to him the importance      of diet, exercise, and weight reduction , as well as avoidance of      alcohol and sedatives.  Driving precautions were discussed with him      as well.  I had also discussed with him the adverse consequences of      untreated sleep apnea including risk of hypertension, coronary      artery disease, cerebrovascular disease, and arrhythmias.  What I      will do is arrange for him to undergo a BiPAP titration study      initially started without oxygen to determine if we can better      treat his sleep apnea.  2. Hypoxemia.  He certainly at rest has a normal oxygen level and I      would like to review his overnight sleep study to determine if he      would actually need to continue on supplemental oxygen.  3. Obesity.  Again, I have discussed with him the importance of diet,      exercise, and weight reduction.  4. Chronic bronchitis.  This is likely related to his tobacco abuse      and has apparently improved considerably since he stopped smoking.   I will  start him on his BiPAP machine after I review his sleep study and  follow up with him in the office 2-3 weeks after that.     Kurt Mires, MD  Electronically Signed    VS/MedQ  DD: 06/26/2007  DT: 06/26/2007  Job #: [id number]   cc:   Kurt Cross, M.D.

## 2011-02-19 NOTE — Discharge Summary (Signed)
NAMESTEVEN, SLEPPY NO.:  [id number]   MEDICAL RECORD NO.:  [id number]          PATIENT TYPE:  INP   LOCATION:  [id number]                         FACILITY[facility]  [facility]   PHYSICIAN:  Elayne Snare, M.D.       DATE OF BIRTH:  [birth year]   DATE OF ADMISSION:  06/15/2007  DATE OF DISCHARGE:  06/19/2007                               DISCHARGE SUMMARY   ADDENDUM   The patient's discharge was held up because of his developing a Mobitz  type 2 second-degree heart block on the morning of discharge and he was  seen by Dr. Lovena Le for EPS.  On June 18, 2007, he had a  biventricular pacemaker implant which was successful.  The patient  tolerated the procedure well.  Pacemaker interrogation on the day of  discharge revealed normal function.  Also, his potassium was stopped and  hydralazine started.  At the time of discharge his blood pressure was  well controlled blood sugars were mildly elevated.  His last creatinine  was 1.3.  The patient was stable for discharge.   DISCHARGE INSTRUCTIONS:  As previously dictated.  In addition,  hydralazine 25 mg t.i.d. and follow up in the office in 2 weeks.      Elayne Snare, M.D.  Electronically Signed     AK/MEDQ  D:  06/19/2007  T:  06/19/2007  Job:  [id number]

## 2011-02-19 NOTE — H&P (Signed)
NAMEDAMAREE, WINTJEN NO.:  [id number]   MEDICAL RECORD NO.:  [id number]          PATIENT TYPE:  INP   LOCATION:  1425                         FACILITY:  [facility]   PHYSICIAN:  Elayne Snare, M.D.       DATE OF BIRTH:  1950/11/30   DATE OF ADMISSION:  06/09/2007  DATE OF DISCHARGE:                              HISTORY & PHYSICAL   CHIEF COMPLAINT:  Shortness of breath.   HISTORY:  This patient has had increasing shortness of breath over the  last few days, especially yesterday.  The patient's dyspnea has been  somewhat intermittent, but yesterday he could not even walk to the  bathroom without getting short of breath.  The patient had complained of  some cough and occasional greenish sputum also for several days but did  not come to his office appointment.  Yesterday also he thinks he started  having more swelling of his feet.  His shortness of breath is somewhat  worse on lying down at night.  He has been taking Lasix and Aldactone in  the past and he thinks he is taking all his medications regularly.   CURRENT MEDICATIONS:  1. Insulin 12 NovoLog twice a day and NPH 72 units the morning and 62      units in the evening.  2. Clonidine 0.2 mg b.i.d.  3. Doxazosin 4 mg h.s.  4. Lasix 80 mg a day.  5. Crestor.  6. Insulin.  7. Metformin.   ALLERGIES:  NONE.   PAST HISTORY:  1. CHF in 2004.  2. Appendectomy.  3. Hernia surgery.  4. Right knee surgery.  5. Episode of CHF pulmonary edema in April 2006.   FAMILY HISTORY:  Not contributory.   SOCIAL HISTORY:  He is married.  He has been disabled.  He quit smoking  in 2005.  He does not drink alcohol.   REVIEW OF SYSTEMS:  He has had diabetes since 1991, requiring insulin  with variable control.  He thinks his blood sugar has been normal  recently.  He has had some nephropathy with proteinuria in the past.  He  does have severe hypertension which has been variably controlled.  He  also has obstructive sleep  apnea being treated by a pulmonologist.  He  has had no numbness in his feet.  No GI or GU symptoms.   PHYSICAL EXAMINATION:  GENERAL:  The patient is markedly obese.  He  appears to have mild facial edema.  No pedal edema present.  He does not  appear cyanotic.  He does seem mildly short of breath.  VITAL SIGNS:  Blood pressure 158/100.  Heart rate 96.  Respirations 24.  HEENT:  Mucous membranes are normal.  NECK:  No lymphadenopathy or carotid bruit.  HEART:  Sounds are normal without any S3, S4 or murmurs.  LUNGS:  Clear to auscultation.  ABDOMEN:  He has some distention related to his hernia with no mass or  tenderness.  EXTREMITIES:  No edema or skin lesions.   LABS:  Potassium of 3.  BNP only 142.  Chest  x-Regina shows bilateral  pulmonary edema.   ASSESSMENT:  The patient appears to have pulmonary edema with increased  blood pressure.  He probably has an element of chronic bronchitis and  may have underlying pneumonia.   PLAN:  Control his blood pressure, continue diuresis and replace  potassium.  Will reduce his insulin as his intake is somewhat poor and  his glucose is much better than usual.      Elayne Snare, M.D.  Electronically Signed     AK/MEDQ  D:  06/09/2007  T:  06/09/2007  Job:  [id number]

## 2011-02-22 NOTE — Discharge Summary (Signed)
NAMECORRIS, COCHRANE NO.:  [id number]   MEDICAL RECORD NO.:  [id number]          PATIENT TYPE:  INP   LOCATION:  6527                         FACILITY:  [facility]   PHYSICIAN:  Elayne Snare, M.D.       DATE OF BIRTH:  1951-03-23   DATE OF ADMISSION:  01/26/2005  DATE OF DISCHARGE:  01/29/2005                                 DISCHARGE SUMMARY   HISTORY:  Patient was admitted with shortness of breath which was  progressive over two weeks.  Had also weakness despite using home oxygen.  He had no other symptoms.  He had been somewhat noncompliant with his  medications and has had known diabetes, obesity, hypertension, sleep apnea,  and congestive heart failure.   MEDICATIONS:  See HPI.   REVIEW OF SYSTEMS:  See HPI.   PHYSICAL EXAMINATION:  VITAL SIGNS:  Abnormal findings were blood pressure  190/118, heart rate 100, respirations 48, oxygen saturation 93%.  GENERAL:  Patient is obese.  LUNGS:  Decreased breath sounds, few scattered crackles.  ABDOMEN:  Massive ventral hernia.  EXTREMITIES:  2+ edema.   HOSPITAL COURSE:  Patient was admitted for treatment of his congestive heart  failure and hypokalemia.  Patient was given breathing treatments as well as  diuretics and potassium as well as resuming his usual medications.   He gradually improved with his dyspnea and because of the history of COPD he  was also given Xopenex nebulizer.  His telemetry recording showed some  transient pauses which were treated with stopping the labetalol and reducing  his clonidine as his blood pressure came down.  Norvasc was increased.  Pulmonary consultation was obtained with Dr. Melvyn Novas and cardiac consultation  with Dr. Terrence Dupont who agreed with medication changes and suggested Persantine  Myoview 2-D protocol.   Because of scheduling problems his test was not done, but scheduled as an  outpatient and his heart rate and bradycardia improved.   LABORATORIES:  EKG showed first degree AV  block, Q-waves in 3, prolonged QT  and left posterior fascicular block.  Paranasal sinus CT ordered by Dr. Melvyn Novas  showed minimal chronic sinusitis.  His chest x-Moller showed mild diffuse CHF  and mild effusion.   Other laboratories showed arterial blood gas with pCO2 of 47.7.  Hemoglobin  of 18.2.  Last potassium 3.3 which was 2.9 on admission.  Troponin I was  0.1.  BNP was 195.  Sputum showed no growth.   DISCHARGE DIAGNOSES:  1.  Chronic obstructive pulmonary disease with exacerbation and mild      congestive heart failure.  2.  Probable diastolic dysfunction with poorly controlled hypertension.  3.  Hypokalemia.  4.  Poorly controlled diabetes.   DISCHARGE MEDICATIONS:  1.  Lasix 40 mg daily.  2.  Potassium 20 mEq daily.  3.  Benicar 40 mg daily.  4.  Norvasc 5 mg daily.  5.  Clonidine 0.1 mg b.i.d.  6.  Lipitor 20 mg daily.  7.  Albuterol inhaler as needed.  8.  Insulin NPH 45 in the morning and 55 in the evening.  9.  Regular insulin 10 b.i.d.   Follow up in the office in three to four weeks.       AK/MEDQ  D:  04/10/2005  T:  04/10/2005  Job:  [id number]   cc:   [name], M.D. [facility]

## 2011-02-22 NOTE — H&P (Signed)
Kurt Cross, Kurt Cross NO.:  [id number]   MEDICAL RECORD NO.:  [id number]          PATIENT TYPE:  EMS   LOCATION:  ED                           FACILITY:  [facility]   PHYSICIAN:  Juanda Bond. Altheimer, M.D.DATE OF BIRTH:  02/04/1951   DATE OF ADMISSION:  01/26/2005  DATE OF DISCHARGE:                                HISTORY & PHYSICAL   REASON FOR ADMISSION:  Exacerbation of congestive heart failure and dyspnea,  off several medications.   HISTORY:  This is a 60 year old patient of Elayne Snare, M.D. with severe  obesity, diabetes mellitus type 2, severe hypertension, obstructive sleep  apnea, congestive heart failure with prior history of pulmonary edema and  respiratory failure, chronic multifactorial dyspnea.  He has been disabled  since about 2001 due to his dyspnea.  He and his wife report that he has  been off of several of his medications including Lasix, Aldactone, Benicar,  and clonidine for about three weeks due to temporary lapse in his insurance  coverage.  He and his wife have been working diligently to try to get the  insurance issue resolved but in the meantime have been unable to afford  direct purchase of medications.  He reports increasing dyspnea and orthopnea  over the past week, some cough over the past week, progressive fatigue and  weakness over the past 1-2 weeks, and markedly increasing lower extremity  edema over the past 1-2 weeks.  He has increased his oxygen use from 1.5  liters to 2.5 liters continuously.  The dyspnea was not particularly worse  today but he felt that he could no longer tolerate it and came to the  emergency room.  He quit smoking in 2005.  There is also some prior history  of medication noncompliance.  He denies chest pain.  He denies fever or  chills.  Blood pressure was 190/118 on arrival in the emergency room with oxygen  saturation at 93%.  He was treated with Lasix 40 mg IV twice with greater  than 1.5 liter diuresis  and currently reports that his breathing and lower  extremity edema are significantly better.  He remains quite fatigued.   PAST MEDICAL HISTORY:  1.  Diabetes mellitus, type 2, diagnosed in 1991, insulin-requiring.  A1c      7.7% in October 2005.  CBGs at home today were 78, 296, and 149.  2.  Severe obesity about 350 pounds.  3.  Dyslipidemia.  4.  Hypertension, severe and difficult to control.  5.  Obstructive sleep apnea, followed by Christinia Gully, M.D. and Danton Sewer, M.D.  6.  Congestive heart failure with florid bilateral pulmonary edema for which      he was hospitalized in December 2004, at which time he also had      hypercarbic respiratory failure.  He was initially treated then with IV      Lasix and IV nitroglycerin and seen in pulmonary consultation by Christinia Gully, M.D.  7.  Status post appendectomy.  8.  Status post ventral  herniorrhaphy.  9.  Status post right knee surgery.   No known drug allergies.   MEDICATIONS:  1.  NPH 50 units q.a.m. and 40 units q.p.m.  2.  NovoLog 11 units q.a.m. and 10 units q.p.m.  3.  Lipitor 20 mg every day.  4.  Norvasc 5 mg every day.  5.  Potassium 20 mEq every day.  6.  Labetalol 300 mg b.i.d.  The patient and his wife report that he has been compliant with the  medications above, however, he ran out of the following medications about  three weeks ago:  Glucophage 1,000 mg b.i.d., Benicar 40 mg every day, Lasix  60 mg every day, Aldactone 50 mg every day, clonidine 0.2 mg b.i.d.  His  wife is to bring in the medications or list in order to confirm these doses.  They do seem quite clear, however, on the medications that he ran out of.   FAMILY HISTORY:  See prior records.   SOCIAL HISTORY:  He is married.  He has been disabled since about 2001 or  2002.  He quit smoking in 2005.  He does not drink alcohol.  He previously  worked for __________  and apparently has been able to [employer] through that  prior employer, although there have been some lapses  in coverage over the past few months due to changes in insurance and changes  in his status with them.  Apparently this is being resolved.   REVIEW OF SYSTEMS:  As above.  He also complains of mild nausea, mild recent  constipation, and some intermittency of urinary flow.  He had a leg cramp  earlier today.   PHYSICAL EXAMINATION:  GENERAL:  Alert, pleasant, cooperative, severely  obese, 60 year old black male in no acute distress.  VITAL SIGNS:  He is afebrile.  O2 sat was 93% on admission and currently 99%  on nasal oxygen.  BP was 190/118 on admission, and subsequently 162/97.  Heart rate about 100.  Respiratory rate recorded at 48.  SKIN:  Negative.  MOUTH:  Dry with poor dentition.  NECK:  Supple without thyromegaly.  Carotid upstrokes normal without bruit.  LUNGS:  Mildly labored with decreased breath sounds and few scattered  crackles.  HEART:  Regular without murmur.  ABDOMEN:  Obese with massive ventral hernia.  EXTREMITIES:  With 2+ pretibial edema with pedal pulses present.  NEUROLOGIC:  Without focal deficit.   LABS:  Potassium 2.9 and subsequently received a total of 80 mEq of  potassium chloride orally.  BNP 195.  Labs are otherwise unremarkable.   EKG:  Sinus tachycardia at 106 with possible old inferior MI.   ASSESSMENT:  1.  Congestive heart failure with pulmonary edema.  2.  Hypertension, severe, out of control secondary to medication      noncompliance, contributing to number one.  3.  Obstructive sleep apnea.  4.  Hypoxemia, multifactorial, chronic and acute.  5.  Hypokalemia, despite recently being off of diuretics.  6.  Diabetes mellitus, type 2, insulin-requiring.  7.  Obesity, severe.  8.  Dyslipidemia.  9.  Medication noncompliance for about three weeks, attributed to lapse in      insurance coverage as well as finances.   PLAN: 1.  We will admit to telemetry and monitor pulse oximetry and fluid  balance.  2.  Oxygen 1.5 liter nasal cannula.  3.  We will continue CPAP per respiratory therapy.  4.  We will check CK and troponin.  5.  We will recheck potassium and replete as needed.  6.  We will get a PA and lateral chest x-Vandervort and check results of the      portable chest x-Heiner done in the ER.  7.  Repeat EKG in the morning.  8.  We will continue and/or resume his usual medications.  9.  Lovenox and aspirin prophylaxis.  10. We will ask care management to assess the issue regarding his medication      acquisition and insurance coverage.  11. I discussed with the patient and his wife these issues.  They are well      aware of the need for full medication compliance.  12. Pulmonary followup, either as inpatient pending his initial progress or      as outpatient.      MDA/MEDQ  D:  01/27/2005  T:  01/27/2005  Job:  [id number]   cc:   Elayne Snare, M.D.  [id number] [address] [place] [address]  Fax: [place] Melvyn Novas, M.D. [facility]   Danton Sewer, M.D. [facility]

## 2011-02-22 NOTE — H&P (Signed)
[facility]  Patient:    Kurt Cross, Kurt Cross Visit Number: [id number] MRN: [id number]          Service Type: Attending:  Parke Poisson. Electa Sniff, M.D. Dictated by:   Parke Poisson. Electa Sniff, M.D. Adm. Date:  10/20/01                           History and Physical  CHIEF COMPLAINT:  This is a 60 year old man who presents with a history of cough and progressive shortness of breath.  HISTORY OF PRESENT ILLNESS:  The patient has previously had a pulmonary infection treated initially with Zithromax approximately three or four weeks prior to this admission.  He was then evaluated by Dr. Melvyn Novas and apparently treated with another course of antibiotics.  For the last two weeks he has had persistent shortness of breath which has been increasing in character, and he has been coughing.  He presents to the emergency room and in the emergency room he was found to be markedly dyspneic.  He had a PCO2 of 41 and a PO2 of 58 with pH of 7.48.  Chest x-Moeckel showed pneumonia.  He did not have an elevated white count.  His white count was 10.2.  He was given oxygen and feels significantly better while taking the oxygen.  He has a history of diabetes mellitus which has been present since 1990.  He is currently taking insulin at a very large dose.  On the morning of this admission his insulin was taken but was given late and his glucose on admission was 150.  He has a history of hypertension and has been taking Diovan and potassium with Cardura.  His potassium was noted to be low at 2.9.  PAST MEDICAL HISTORY: 1. He has had multiple ventral hernia surgery in the past. 2. He has also had tonsillectomy. 3. Pneumonia in the past.  MEDICATIONS PRIOR TO THIS ADMISSION: 1. Insulin 65 of N and 10 of R in the morning, 35 of N and 10 of R at supper. 2. He has also been taking Glucophage XR two daily. 3. Cardura 4 mg q.d. 4. Diovan 160/12.5 q.d. 5. K-Dur.  PERSONAL HISTORY:  He used to smoke, but  discontinued this in July 2002.  He denies excessive alcoholic intake and no history of allergies to medications.  FAMILY HISTORY:  His mother died at the age of 82.  She had diabetes and apparently had excessive alcohol.  REVIEW OF SYSTEMS:  His weight has been elevated.  He has been weak for the last two weeks.  PHYSICAL EXAMINATION:  GENERAL:  This is a well-developed, markedly obese man who appears stable at this time.  SKIN:  His skin is warm.  HEENT:  His head is normocephalic without any evidence of trauma.  NECK:  Supple.  LUNGS:  Reveals rhonchi to be present bilaterally.  Wheezing is present on the left.  CARDIOVASCULAR:  Rhythm is regular.  ABDOMEN:  Markedly protuberant.  Ventral hernia is present.  No masses are present.  No tenderness is present.  EXTREMITIES:  Show trace edema.  NEUROMUSCULAR:  Sensory normal.  IMPRESSION: 1. Pneumonia. 2. Diabetes mellitus, type 2, requiring insulin. 3. History of hypertension. 4. Obesity and possible primary hypoventilation. 5. Hypokalemia. Dictated by:   Parke Poisson. Electa Sniff, M.D. Attending:  Parke Poisson. Electa Sniff, M.D. DD:  10/20/01 TD:  10/20/01 Job: [id number]

## 2011-02-22 NOTE — Discharge Summary (Signed)
[facility]  Patient:    Kurt Cross, Kurt Cross Visit Number: [id number] MRN: [id number]          Service Type: MED Location: 4E [id number] Attending Physician:  Elayne Snare Dictated by:   Elayne Snare, M.D. Admit Date:  10/20/2001 Discharge Date: 10/23/2001                             Discharge Summary  HISTORY OF PRESENT ILLNESS:  This is a 60 year old patient with known hypertension who presented with shortness of breath.  He had been having cough and greenish sputum for a couple of weeks which had not improved much with antibiotics.  He was admitted because he was found to be hypoxic in the emergency room.  His medications were Glucophage, insulin, Cardura, Diovan, and potassium.  PAST MEDICAL HISTORY:  See HPI.  SOCIAL HISTORY:  See HPI.  REVIEW OF SYSTEMS:  See HPI.  PHYSICAL EXAMINATION:  LUNGS:  Physical examination was significant for rhonchi in his lungs and some wheezing on the left.  ABDOMEN:  He had a protuberant abdomen with ventral hernia and trace pedal edema.  Otherwise exam was normal.  HOSPITAL COURSE:  The patient was given antibiotics and Albuterol with which he gradually improved.  His blood pressures and blood sugars were fairly well controlled.  Potassium was replaced.  Lasix was given empirically on October 22, 2001.  He was still somewhat short of breath.  LABORATORY DATA:  Potassium 2.9, glucose 150, albumin 3.4.  White blood count 7.0.  A pO2 was 58 on room air.  Blood cultures were negative.  Chest x-Meisenheimer showed a perihilar interstitial edema or infiltrates with some pulmonary vascular congestion and mild cardiomegaly.  EKG showed left axis deviation and first degree AV block.  DISCHARGE DIAGNOSES: 1. Probable bronchial pneumonia. 2. Mild congestive heart failure. 3. Hypertension. 4. Diabetes.  CONDITION ON DISCHARGE:  Improved.  DISCHARGE INSTRUCTIONS:  Continue home medications.  In addition, to take Tequin for  five days and to use Clarinex and Nasonex for his nasal allergies. Follow up in the office in one month. Dictated by:   Elayne Snare, M.D. Attending Physician:  Elayne Snare DD:  10/23/01 TD:  10/25/01 Job: [id number]

## 2011-02-22 NOTE — H&P (Signed)
NAME:  Kurt Cross, Kurt Cross                           ACCOUNT NO.:  [id number]   MEDICAL RECORD NO.:  [id number]                   PATIENT TYPE:  EMS   LOCATION:  ED                                   FACILITY:  [facility]   PHYSICIAN:  Elayne Snare, M.D.                    DATE OF BIRTH:  December 30, 1950   DATE OF ADMISSION:  09/12/2003  DATE OF DISCHARGE:                                HISTORY & PHYSICAL   CHIEF COMPLAINT:  Shortness of breath.   HISTORY:  This is a 60 year old African-American patient who presented to  the emergency room last night with increasing shortness of breath.  The  patient does have a history of mild CHF in the past but recently apparently  has been having increasing swelling of his feet and some shortness of breath  on exertion.  For a few days he was also having some trouble lying down flat  at night.  He has no chest pain or fever, no chills.  He has had some  chronic cough with light greenish sputum which is not any worse.  The  patient has currently been taking all his medications which include  clonidine, Benicar, metformin, Maxzide, potassium, doxazosin, lovastatin,  and insulin.   ALLERGIES:  None.   PAST MEDICAL HISTORY:  1. Hernia surgery.  2. Sleep apnea surgery.  3. Pneumonia.  4. COPD.  5. CHF.   SOCIAL HISTORY:  He is married.  He is a light smoker.  No history of  alcohol use.   FAMILY HISTORY:  Positive for diabetes.   REVIEW OF SYSTEMS:  The patient has long-standing diabetes which is usually  poorly controlled.  The patient is somewhat noncompliant and unable to lose  weight.  He has a history of abdominal hernia.  He has had no neuropathy.  He has had mild hypercholesterolemia.  He has had fairly significant  hypertension that has been difficult to control.  He has had history of mild  COPD with recurrent bronchitis.  Has a history of sleep apnea.  No history  of urinary symptoms.  The patient also has history of some depression for  which he  was recently started on Prozac.   PHYSICAL EXAMINATION:  GENERAL:  The patient is markedly obese.  He is  comfortable currently.  VITAL SIGNS:  Pulse 92, blood pressure 168/110, temperature normal,  respirations 22.  HEENT:  Eyes:  Externally normal.  ENT examination normal.  Mucous membranes  normal.  NECK:  No JVD or lymphadenopathy.  EXTREMITIES:  No edema.  HEART:  He has S3 gallop present.  LUNGS:  Bilateral basal crepitations, but no wheezing.  ABDOMEN:  Markedly distended with abdominal hernia.  No mass palpable.  EXTREMITIES:  Pedal pulses normal.   LABORATORIES:  Chest x-Rajagopalan shows pulmonary edema.  His laboratories are  unremarkable except for potassium of 3 and glucose of 248.  PLAN:  Diurese him and control blood pressure as per orders.                                               Elayne Snare, M.D.    AK/MEDQ  D:  09/12/2003  T:  09/12/2003  Job:  [id number]

## 2011-02-22 NOTE — Consult Note (Signed)
NAME:  Kurt Cross, Kurt Cross                           ACCOUNT NO.:  [id number]   MEDICAL RECORD NO.:  [id number]                   PATIENT TYPE:  INP   LOCATION:  [place]                                 FACILITY:  [facility]   PHYSICIAN:  Kurt Cross, M.D. [facility]           DATE OF BIRTH:  06/07/1951   DATE OF CONSULTATION:  09/14/2003  DATE OF DISCHARGE:                                   CONSULTATION   REPORT TITLE:  PULMONOLOGY CONSULTATION   CONSULTING PHYSICIAN:  Kurt Cross, M.D. [facility]   REFERRED BY:  Kurt Cross, M.D.   HISTORY:  This is a 60 year old black male with morbid obesity with a target  weight of 191 pounds with a peak all-time weight he says of around 350  pounds. I had seen him in the office before for chronic cough that I thought  reflux and classic obstructive sleep apnea with an RDI documented to be 72  back in March 2004. I recommended a 10 cm CPAP trial and then a repeat  titration study. It is not clear to me if he has actually been using the  CPAP or ever received the titration study as I do not have the results of  any further studies and he does not remember getting any further studies. He  has, however, been using oxygen 1.5 L he says at bedtime and p.r.n. during  the day and comes in now with several months of increasing dyspnea, cough,  and congestion. He did previously have green sputum, but was treated with  an antibiotic as an outpatient, and states that the problem had improved.  However, he had progressive orthopnea, PND, and leg swelling resulting in  hospitalization under Dr. Ronnie Cross care and a chest x-Kurt Cross that showed florid  bilateral pulmonary edema. He is feeling somewhat better, after diuresis  with IV Lasix, was in the ICU, but now is transferred to floor for further  evaluation of what appears to be chronic hypoxemic and hypercarbic  respiratory failure.   The patient's previous bicarbonate level was 42 in our records from a year  ago suggesting  chronic hypercarbic respiratory failure then. He has also had  significant and very poorly controlled hypertension and obesity with  secondary diabetes attributed to documented nonadherence.   He denies any pleuritic pain, fever, chills, sweats, active sinus  complaints, and overt reflux symptoms.   PAST MEDICAL HISTORY:  Significant for remote appendectomy, tonsillectomy,  and previous right knee surgery.   ALLERGIES:  None known.   MEDICATIONS:  At the time of hospitalization he reported that he was taking  clonidine, Benicar, metformin, Maxzide, potassium, Dotarizine, lovastatin,  and insulin.   SOCIAL HISTORY:  He is married. He continues to smoke lightly. He denies  any alcohol history.   FAMILY HISTORY:  Positive for diabetes. Negative for respiratory diseases.   REVIEW OF SYSTEMS:  Taken in detail essentially negative except  as  already  outlined above.   PHYSICAL EXAMINATION:  GENERAL:  This is an obese black male in no acute  distress, at rest,  on room air with an elevated bed of about 30 degrees.  VITAL SIGNS:  He is afebrile with normal vital signs with a net of a  negative 1284 mL the last 24 hours.  HEENT:  Unremarkable. Pharynx clear. Dentition is adequate.  NECK:  Supple without cervical adenopathy. Neck veins are not distended.  LUNGS:  The lung fields reveal crackles on inspiration and a few rhonchi on  expiration. Noted few wheezes.  HEART:  Regular rate and rhythm without murmurs, rubs, or gallops.  S1 and  S2 diminished.  ABDOMEN:  Massively obese, otherwise benign with limited inspiratory  excursion.  EXTREMITIES:  Trace edema bilaterally.  Extremities are warm.  No clubbing  or cyanosis.  NEUROLOGIC:  No focal deficits. Affect and mood appear normal.   RADIOLOGICAL DATA:  Chest x-Mccoin reveals cardiomegaly with relatively low  lung volumes, diffuse alveolar edema that appears to have improved within 24  hours to a pattern of vague increased interstitial  markings.   IMPRESSION:  1. This patient's main unaddressed long-term problem is morbid obesity.  His     target weight is 191 pounds and he has progressed in the wrong direction     with documented poor adherence to both medications, diet, and I suspect     also to CPAP. He has documented severe sleep apnea in a study dated March     2004 with a respiratory disturbance index of 72 then and I believe     weighed 10-15 pounds less than he does now.  It is absolutely critical     therefore that he take seriously recommendations to follow a reasonable     diet and consider even formal rehab program for this individual.  2. He clearly has congestive heart failure that is probably biventricular     and may be related to be obstructive sleep apnea as well as poorly     controlled hypertension.  I would recommend 24 hour oxygen, placing the     patient back on CPAP at least 10 cm while here and then documenting that     his saturations are while he is on a reasonable home regimen.   In terms of his diuretic therapy, I would recommend high doses of Aldactone  and Lasix in combination, and hypertension treatment per Kurt Cross, but  would avoid nonselected beta-blockers as well as ACE inhibitors in this  particular individual.   I would be certainly happy to see this patient in the inpatient or  outpatient setting, but note that we need to keep his care just as simple as  possible because he is unlikely to adhere to a complex medical regimen or  seeing multiple specialists.   ADDENDUM:  I did not actually realize the patient was smoking until I read  Dr. Ronnie Cross H&P.  This clearly is another issue that needs to be addressed  by the patient especially since I am recommending that he be on 24 hour  oxygen, that is, he needs to begin adhering specific recommendations to stop  smoking and lose weight or begin to think about end of life issues, sooner rather than later.  Kurt Cross, M.D. [facility]    MBW/MEDQ  D:  09/14/2003  T:  09/14/2003  Job:  [id number]   cc:   Kurt Cross, M.D.  [id number] [place]  [place] [address]  Fax: [phone]

## 2011-02-22 NOTE — Discharge Summary (Signed)
NAME:  Kurt Cross, Kurt Cross                           ACCOUNT NO.:  [id number]   MEDICAL RECORD NO.:  [id number]                   PATIENT TYPE:  INP   LOCATION:  [id number]                                 [facility]ACILITY:  [facility]   PHYSICIAN:  Elayne Snare, M.D.                    DATE OF BIRTH:  1951/06/09   DATE OF ADMISSION:  09/12/2003  DATE OF DISCHARGE:  09/16/2003                                 DISCHARGE SUMMARY   HISTORY:  The patient was admitted to the emergency room with acute  shortness of breath.  He also had some orthopnea and increasing pedal edema  for about two days.  The patient had been taking his usual medications,  although his blood pressure has been somewhat difficult to control.   MEDICATIONS ON ADMISSION:  1. Benicar.  2. Clonidine.  3. Metformin.  4. Maxzide.  5. Potassium.  6. Doxazosin.  7. Lovastatin.  8. Insulin.   REVIEW OF SYSTEMS:  See HPI.   PAST MEDICAL HISTORY:  See HPI.   PHYSICAL EXAMINATION:  VITAL SIGNS:  Blood pressure was 168/110, pulse was  92.  GENERAL:  The patient is markedly obese, fairly comfortable on oxygen.  HEENT:  Eyes, ENT, and mucous membranes were normal.  NECK:  Normal.  HEART:  S3 gallop rhythm.  LUNGS:  Bilateral basilar crepitations without wheezing.  ABDOMEN:  Distended with abdominal hernia.  EXTREMITIES:  Unremarkable.   HOSPITAL COURSE:  The patient had pulmonary edema on chest x-Brewbaker and was  given intravenous Lasix and intravenous nitroglycerin drip.  The patient's  blood pressure was resistant to treatment and required nitroglycerin for  about two days.  Potassium was also replaced and Aldactone started.  Norvasc  and labetalol were added for blood pressure control.  Dr. Melvyn Novas was consulted  and he felt that the patient should start CPAP and stop smoking.  He also  recommended Toprol and no ACE inhibitors because of his variable COPD.   The patient's CHF gradually improved, his chest x-Peppel appearance improved,  and he was  able to be discharged.  His insulin was adjusted as needed.  Fairly good blood sugar control.   LABORATORY DATA:  EKG showed possible left atrial enlargement, poor QRS in  V2 and V3.  His chest x-Chittenden showed pulmonary edema initially which  progressively improved on the following two chest x-rays.  Lab work:  WBC  11,100, potassium 3 on admission, glucose 248 on admission.  Potassium 3.4  on discharge.  Normal renal function.  Normal CK, troponin, TSH.  BNP 262.   DISCHARGE DIAGNOSES:  1. Acute pulmonary edema.  2. Severe hypertension.  3. Obesity and history of smoking.  4. Diabetes.  5. Depression.  6. Hypercholesterolemia.   CONDITION ON DISCHARGE:  Improved.   DISCHARGE INSTRUCTIONS:  1. Continue a low salt, low fat diet.  2. The patient is to use CPAP  mask at night and oxygen as recommended by Dr.     Melvyn Novas.  3. Blood sugar testing at least twice a day.   DISCHARGE MEDICATIONS:  1. Benicar 40 mg daily.  2. Norvasc 10 mg q.d.  3. Clonidine 0.2 mg b.i.d.  4. Spironolactone 50 mg q.d.  5. Labetalol 200 mg b.i.d.  6. Insulin 45 NPH 20 regular morning, and 35 NPH 15 regular in the evening.  7. Lasix 60 mg a day.  8. Potassium three a day.  9. Lovastatin one a day.   The patient was started back on labetalol since his blood pressure was  difficult to control and he did not have any bronchospasm on this drug.                                               Elayne Snare, M.D.    AK/MEDQ  D:  09/16/2003  T:  09/16/2003  Job:  [id number]   cc:   [name], M.D. [facility]

## 2011-05-17 ENCOUNTER — Encounter: Payer: Self-pay | Admitting: *Deleted

## 2011-07-01 LAB — CBC
HCT: 44.9
Hemoglobin: 15.8
MCV: 95.2
Platelets: 237
WBC: 11.7 — ABNORMAL HIGH

## 2011-07-01 LAB — BASIC METABOLIC PANEL
BUN: 11
Chloride: 102
Glucose, Bld: 151 — ABNORMAL HIGH
Potassium: 3.1 — ABNORMAL LOW

## 2011-07-01 LAB — DIFFERENTIAL
Eosinophils Absolute: 0.1
Eosinophils Relative: 1
Lymphs Abs: 2.1
Monocytes Absolute: 0.7

## 2011-07-01 LAB — B-NATRIURETIC PEPTIDE (CONVERTED LAB): Pro B Natriuretic peptide (BNP): 183 — ABNORMAL HIGH

## 2011-07-19 LAB — BASIC METABOLIC PANEL
BUN: 11
BUN: 19
BUN: 22
BUN: 23
CO2: 31
Calcium: 9.3
Calcium: 9.5
Chloride: 99
Creatinine, Ser: 1.34
Creatinine, Ser: 1.4
Creatinine, Ser: 1.49
GFR calc Af Amer: 59 — ABNORMAL LOW
GFR calc Af Amer: >60
GFR calc non Af Amer: 56 — ABNORMAL LOW
GFR calc non Af Amer: >60
Glucose, Bld: 226 — ABNORMAL HIGH
Potassium: 3.6
Sodium: 138

## 2011-07-19 LAB — CBC
HCT: 38.2 — ABNORMAL LOW
HCT: 38.9 — ABNORMAL LOW
HCT: 40.3
Hemoglobin: 13.4
MCHC: 33.5
MCHC: 34.1
MCV: 96.9
MCV: 97.4
MCV: 97.4
Platelets: 256
Platelets: 257
Platelets: 274
RBC: 4.01 — ABNORMAL LOW
RBC: 4.47
RDW: 13.6
RDW: 13.9
WBC: 11.2 — ABNORMAL HIGH
WBC: 11.9 — ABNORMAL HIGH
WBC: 9.2

## 2011-07-19 LAB — COMPREHENSIVE METABOLIC PANEL
AST: 20
BUN: 16
BUN: 16
CO2: 30
CO2: 31
Calcium: 9.4
Calcium: 9.5
Chloride: 102
Creatinine, Ser: 1.36
Creatinine, Ser: 1.51 — ABNORMAL HIGH
Creatinine, Ser: 1.53 — ABNORMAL HIGH
GFR calc non Af Amer: 48 — ABNORMAL LOW
GFR calc non Af Amer: 55 — ABNORMAL LOW
Glucose, Bld: 113 — ABNORMAL HIGH
Glucose, Bld: 202 — ABNORMAL HIGH
Sodium: 139
Total Bilirubin: 1.1
Total Bilirubin: 2.1 — ABNORMAL HIGH
Total Protein: 7.1

## 2011-07-19 LAB — CULTURE, BLOOD (ROUTINE X 2): Culture: NO GROWTH

## 2011-07-19 LAB — MAGNESIUM: Magnesium: 1.9

## 2011-07-19 LAB — CARDIAC PANEL(CRET KIN+CKTOT+MB+TROPI)
Relative Index: 2.3
Troponin I: 0.03

## 2011-07-19 LAB — C-REACTIVE PROTEIN: CRP: 1.2 — ABNORMAL HIGH (ref <0.6)

## 2011-07-19 LAB — DIFFERENTIAL
Basophils Relative: 0
Eosinophils Absolute: 0.2
Lymphs Abs: 1.9
Monocytes Relative: 4
Neutro Abs: 9.2 — ABNORMAL HIGH
Neutrophils Relative %: 77

## 2011-07-19 LAB — LIPID PANEL
Cholesterol: 126
HDL: 24 — ABNORMAL LOW
LDL Cholesterol: 83
Triglycerides: 97

## 2011-07-19 LAB — APTT: aPTT: 34

## 2011-07-19 LAB — B-NATRIURETIC PEPTIDE (CONVERTED LAB): Pro B Natriuretic peptide (BNP): 142 — ABNORMAL HIGH

## 2011-07-19 LAB — PROTIME-INR: Prothrombin Time: 14.7

## 2011-07-19 LAB — POCT CARDIAC MARKERS
Myoglobin, poc: 148
Operator id: 1192

## 2012-06-28 ENCOUNTER — Emergency Department (HOSPITAL_COMMUNITY): Payer: Medicare Other

## 2012-06-28 ENCOUNTER — Encounter (HOSPITAL_COMMUNITY): Payer: Self-pay | Admitting: *Deleted

## 2012-06-28 ENCOUNTER — Observation Stay (HOSPITAL_COMMUNITY)
Admission: EM | Admit: 2012-06-28 | Discharge: 2012-06-29 | Disposition: A | Discharge status: Home / Self Care | Payer: Medicare Other | Attending: Internal Medicine | Admitting: Internal Medicine

## 2012-06-28 DIAGNOSIS — E785 Hyperlipidemia, unspecified: Secondary | ICD-10-CM | POA: Insufficient documentation

## 2012-06-28 DIAGNOSIS — E66813 Obesity, class 3: Secondary | ICD-10-CM | POA: Diagnosis present

## 2012-06-28 DIAGNOSIS — I2510 Atherosclerotic heart disease of native coronary artery without angina pectoris: Secondary | ICD-10-CM | POA: Insufficient documentation

## 2012-06-28 DIAGNOSIS — I10 Essential (primary) hypertension: Secondary | ICD-10-CM | POA: Diagnosis present

## 2012-06-28 DIAGNOSIS — E119 Type 2 diabetes mellitus without complications: Secondary | ICD-10-CM | POA: Insufficient documentation

## 2012-06-28 DIAGNOSIS — Z794 Long term (current) use of insulin: Secondary | ICD-10-CM | POA: Insufficient documentation

## 2012-06-28 DIAGNOSIS — Z23 Encounter for immunization: Secondary | ICD-10-CM | POA: Insufficient documentation

## 2012-06-28 DIAGNOSIS — R0600 Dyspnea, unspecified: Secondary | ICD-10-CM

## 2012-06-28 DIAGNOSIS — R0902 Hypoxemia: Secondary | ICD-10-CM | POA: Insufficient documentation

## 2012-06-28 DIAGNOSIS — Z79899 Other long term (current) drug therapy: Secondary | ICD-10-CM | POA: Insufficient documentation

## 2012-06-28 DIAGNOSIS — G4733 Obstructive sleep apnea (adult) (pediatric): Secondary | ICD-10-CM | POA: Diagnosis present

## 2012-06-28 DIAGNOSIS — E782 Mixed hyperlipidemia: Secondary | ICD-10-CM | POA: Diagnosis present

## 2012-06-28 DIAGNOSIS — I379 Nonrheumatic pulmonary valve disorder, unspecified: Secondary | ICD-10-CM | POA: Insufficient documentation

## 2012-06-28 DIAGNOSIS — I5020 Unspecified systolic (congestive) heart failure: Secondary | ICD-10-CM | POA: Insufficient documentation

## 2012-06-28 DIAGNOSIS — E669 Obesity, unspecified: Secondary | ICD-10-CM | POA: Insufficient documentation

## 2012-06-28 DIAGNOSIS — Z950 Presence of cardiac pacemaker: Secondary | ICD-10-CM | POA: Insufficient documentation

## 2012-06-28 DIAGNOSIS — R0602 Shortness of breath: Principal | ICD-10-CM | POA: Diagnosis present

## 2012-06-28 DIAGNOSIS — E1122 Type 2 diabetes mellitus with diabetic chronic kidney disease: Secondary | ICD-10-CM | POA: Diagnosis present

## 2012-06-28 DIAGNOSIS — I509 Heart failure, unspecified: Secondary | ICD-10-CM | POA: Diagnosis present

## 2012-06-28 HISTORY — DX: Essential (primary) hypertension: I10

## 2012-06-28 HISTORY — DX: Hyperlipidemia, unspecified: E78.5

## 2012-06-28 LAB — COMPREHENSIVE METABOLIC PANEL
AST: 19 U/L (ref 0–37)
Albumin: 3.2 g/dL — ABNORMAL LOW (ref 3.5–5.2)
Chloride: 96 mEq/L (ref 96–112)
Creatinine, Ser: 1.07 mg/dL (ref 0.50–1.35)
Potassium: 3.5 mEq/L (ref 3.5–5.1)
Total Bilirubin: 0.3 mg/dL (ref 0.3–1.2)
Total Protein: 7.7 g/dL (ref 6.0–8.3)

## 2012-06-28 LAB — GLUCOSE, CAPILLARY
Glucose-Capillary: 143 mg/dL — ABNORMAL HIGH (ref 70–99)
Glucose-Capillary: 48 mg/dL — ABNORMAL LOW (ref 70–99)
Glucose-Capillary: 222 mg/dL — ABNORMAL HIGH (ref 70–99)

## 2012-06-28 LAB — TSH: TSH: 1.088 u[IU]/mL (ref 0.350–4.500)

## 2012-06-28 LAB — POCT I-STAT, CHEM 8
BUN: 15 mg/dL (ref 6–23)
Calcium, Ion: 1.16 mmol/L (ref 1.12–1.23)
Chloride: 101 mEq/L (ref 96–112)
HCT: 51 % (ref 39.0–52.0)
Potassium: 3.5 mEq/L (ref 3.5–5.1)

## 2012-06-28 LAB — CBC
MCHC: 35.8 g/dL (ref 30.0–36.0)
MCV: 94.5 fL (ref 78.0–100.0)
Platelets: 227 10*3/uL (ref 150–400)
RDW: 13.3 % (ref 11.5–15.5)
WBC: 12.9 10*3/uL — ABNORMAL HIGH (ref 4.0–10.5)

## 2012-06-28 LAB — PRO B NATRIURETIC PEPTIDE: Pro B Natriuretic peptide (BNP): 166 pg/mL — ABNORMAL HIGH (ref 0–125)

## 2012-06-28 LAB — TROPONIN I
Troponin I: <0.3 ng/mL (ref <0.30)
Troponin I: <0.3 ng/mL (ref <0.30)
Troponin I: <0.3 ng/mL (ref <0.30)

## 2012-06-28 MED ORDER — SODIUM CHLORIDE 0.9 % IJ SOLN
3.0000 mL | Freq: Two times a day (BID) | INTRAMUSCULAR | Status: DC
  Administered 2012-06-28 – 2012-06-29 (×3): 3 mL via INTRAVENOUS

## 2012-06-28 MED ORDER — OXYCODONE-ACETAMINOPHEN 5-325 MG PO TABS
1.0000 | ORAL_TABLET | ORAL | Status: DC | PRN
Start: 2012-06-28 — End: 2012-06-29
  Administered 2012-06-28 – 2012-06-29 (×6): 2 via ORAL
  Filled 2012-06-28 (×6): qty 2

## 2012-06-28 MED ORDER — ENOXAPARIN SODIUM 30 MG/0.3ML ~~LOC~~ SOLN
30.0000 mg | SUBCUTANEOUS | Status: DC
  Administered 2012-06-28 – 2012-06-29 (×2): 30 mg via SUBCUTANEOUS
  Filled 2012-06-28 (×2): qty 0.3

## 2012-06-28 MED ORDER — IOHEXOL 350 MG/ML SOLN
100.0000 mL | Freq: Once | INTRAVENOUS | Status: AC | PRN
  Administered 2012-06-28: 100 mL via INTRAVENOUS

## 2012-06-28 MED ORDER — SPIRONOLACTONE 100 MG PO TABS
100.0000 mg | ORAL_TABLET | Freq: Every day | ORAL | Status: DC
  Administered 2012-06-28 – 2012-06-29 (×2): 100 mg via ORAL
  Filled 2012-06-28 (×2): qty 1

## 2012-06-28 MED ORDER — SODIUM CHLORIDE 0.9 % IJ SOLN: 3.0000 mL | INTRAMUSCULAR | Status: DC | PRN

## 2012-06-28 MED ORDER — SODIUM CHLORIDE 0.9 % IV SOLN: 250.0000 mL | INTRAVENOUS | Status: DC | PRN

## 2012-06-28 MED ORDER — CLONIDINE HCL 0.1 MG PO TABS
0.1000 mg | ORAL_TABLET | Freq: Two times a day (BID) | ORAL | Status: DC
  Administered 2012-06-28 (×2): 0.1 mg via ORAL
  Filled 2012-06-28 (×4): qty 1

## 2012-06-28 MED ORDER — DEXTROSE 50 % IV SOLN
INTRAVENOUS | Status: AC
  Administered 2012-06-28: 50 mL via INTRAVENOUS
  Filled 2012-06-28: qty 50

## 2012-06-28 MED ORDER — PNEUMOCOCCAL VAC POLYVALENT 25 MCG/0.5ML IJ INJ
0.5000 mL | INJECTION | INTRAMUSCULAR | Status: AC
  Administered 2012-06-29: 0.5 mL via INTRAMUSCULAR
  Filled 2012-06-28: qty 0.5

## 2012-06-28 MED ORDER — INFLUENZA VIRUS VACC SPLIT PF IM SUSP
0.5000 mL | INTRAMUSCULAR | Status: AC
  Administered 2012-06-29: 0.5 mL via INTRAMUSCULAR
  Filled 2012-06-28: qty 0.5

## 2012-06-28 MED ORDER — ONDANSETRON HCL 4 MG/2ML IJ SOLN: 4.0000 mg | Freq: Four times a day (QID) | INTRAMUSCULAR | Status: DC | PRN

## 2012-06-28 MED ORDER — INSULIN ASPART 100 UNIT/ML ~~LOC~~ SOLN
12.0000 [IU] | Freq: Three times a day (TID) | SUBCUTANEOUS | Status: DC
  Administered 2012-06-28 – 2012-06-29 (×3): 12 [IU] via SUBCUTANEOUS

## 2012-06-28 MED ORDER — ACETAMINOPHEN 325 MG PO TABS: 650.0000 mg | ORAL_TABLET | ORAL | Status: DC | PRN

## 2012-06-28 MED ORDER — INSULIN ASPART 100 UNIT/ML ~~LOC~~ SOLN
0.0000 [IU] | Freq: Every day | SUBCUTANEOUS | Status: DC
  Administered 2012-06-28: 2 [IU] via SUBCUTANEOUS

## 2012-06-28 MED ORDER — DEXTROSE 50 % IV SOLN
1.0000 | Freq: Once | INTRAVENOUS | Status: AC
  Administered 2012-06-28: 50 mL via INTRAVENOUS

## 2012-06-28 MED ORDER — INSULIN ASPART 100 UNIT/ML ~~LOC~~ SOLN
0.0000 [IU] | Freq: Three times a day (TID) | SUBCUTANEOUS | Status: DC
  Administered 2012-06-28: 4 [IU] via SUBCUTANEOUS
  Administered 2012-06-28: 7 [IU] via SUBCUTANEOUS
  Administered 2012-06-29: 3 [IU] via SUBCUTANEOUS
  Administered 2012-06-29: 7 [IU] via SUBCUTANEOUS

## 2012-06-28 MED ORDER — FUROSEMIDE 40 MG PO TABS
40.0000 mg | ORAL_TABLET | Freq: Three times a day (TID) | ORAL | Status: DC
  Administered 2012-06-28 – 2012-06-29 (×4): 40 mg via ORAL
  Filled 2012-06-28 (×6): qty 1

## 2012-06-28 MED ORDER — ATORVASTATIN CALCIUM 20 MG PO TABS
20.0000 mg | ORAL_TABLET | Freq: Every day | ORAL | Status: DC
  Administered 2012-06-28: 20 mg via ORAL
  Filled 2012-06-28 (×2): qty 1

## 2012-06-28 MED ORDER — INSULIN NPH (HUMAN) (ISOPHANE) 100 UNIT/ML ~~LOC~~ SUSP
50.0000 [IU] | Freq: Two times a day (BID) | SUBCUTANEOUS | Status: DC
  Administered 2012-06-28 – 2012-06-29 (×2): 50 [IU] via SUBCUTANEOUS
  Filled 2012-06-28: qty 10

## 2012-06-28 NOTE — ED Notes (Signed)
143 CBG

## 2012-06-28 NOTE — ED Notes (Signed)
Patient wanting to eat breakfast tray prior to leaving.  Pt currently sitting up in bed in no distress eating breakfast and then will transfer ASAP

## 2012-06-28 NOTE — ED Notes (Signed)
Attempted to call report to 4W to [place], but nurse unable to accept as she is in a patient room. Nurse states she will call back.  Nurse given phone number to call back.

## 2012-06-28 NOTE — H&P (Signed)
[facility] History and Physical  Kurt Cross [id number] DOB: 03-Oct-1951 DOA: 06/28/2012  Referring physician: ER physician PCP: No primary provider on file.   Chief Complaint: shortness of breath  HPI:  61 year old male with history of congestive heart failure, diabetes, hypertension and dyslipidemia who presented to ED with complaints of worsening shortness of breath started one day prior to the admission. Patient reports no associated chest pain, palpitations. Patient does report feeling short of breath with ambulation and not being able to take even one flight of stairs. Patient reports feeling weak and tired. No complaints of abdominal pain, nausea or vomiting. No reports of blood in stool or urine. No fever or chills. No lightheadedness or loss of consciousness.  Assessment and plan:  Principal Problem:  *Shortness of breath, desaturation - obvious concern is for pulmonary embolism which has been ruled out with negative CT chest angio - this may be due to OHS/OSA or acute decompensated systolic CHF - based on 2 D ECHO in 2004, EF ~ 45% - we will repeat 2 D ECHO on this admission - CHF order set in place - oxygen support via nasal cannula to keep oxygen saturation above 90%  Active Problems:  DM - Will check A1c level - Continue current home insulin regimen - Sliding-scale insulin   HYPERLIPIDEMIA - Continue Statin   OBESITY - Nutrition consult   OBSTRUCTIVE SLEEP APNEA - CPAP at nighttime   CONGESTIVE HEART FAILURE, systolic - Based on 2-D echo in 2004, ejection fraction around 45% - 2 sets of troponins are negative - BNP on this admission only mildly elevated - Will check 2-D echo on this admission - Daily weight and strict intake and output - Continue Lasix PO and spironolactone  Hypertension - Continue Clonidine  Code Status: Full Family Communication: Pt at bedside Disposition Plan: Admit for further evaluation to telemetry  Leisa Lenz,  MD  [facility] Pager [phone]  If 7PM-7AM, please contact night-coverage [email or web address] Password [email or web address] 06/28/2012, 8:06 AM  Review of Systems:  Constitutional: Negative for fever, chills and malaise/fatigue. Negative for diaphoresis.  HENT: Negative for hearing loss, ear pain, nosebleeds, congestion, sore throat, neck pain, tinnitus and ear discharge.   Eyes: Negative for blurred vision, double vision, photophobia, pain, discharge and redness.  Respiratory:per HPI Cardiovascular: Negative for chest pain, palpitations, orthopnea, claudication and leg swelling.  Gastrointestinal: Negative for nausea, vomiting and abdominal pain. Negative for heartburn, constipation, blood in stool and melena.  Genitourinary: Negative for dysuria, urgency, frequency, hematuria and flank pain.  Musculoskeletal: Negative for myalgias, back pain, joint pain and falls.  Skin: Negative for itching and rash.  Neurological: Negative for dizziness and weakness. Negative for tingling, tremors, sensory change, speech change, focal weakness, loss of consciousness and headaches.  Endo/Heme/Allergies: Negative for environmental allergies and polydipsia. Does not bruise/bleed easily.  Psychiatric/Behavioral: Negative for suicidal ideas. The patient is not nervous/anxious.      Past Medical History  Diagnosis Date  . Sleep apnea   . Diabetes mellitus   . Hypertension   . Hyperlipidemia   . CHF (congestive heart failure)   . Hernia    Past Surgical History  Procedure Date  . Appendectomy   . Tonsillectomy   . Hernia surgeries     multiple surgeries  . Nasal septum surgery    Social History:  reports that he has been smoking.  He does not have any smokeless tobacco history on file. He reports that he drinks alcohol. He reports that he  does not use illicit drugs.  No Known Allergies  Family History: CAD and HTN in parents  Prior to Admission medications   Medication Sig Start Date End Date Taking? Authorizing  Provider  cloNIDine (CATAPRES) 0.1 MG tablet Take 0.1 mg by mouth 2 (two) times daily.   Yes Historical Provider, MD  furosemide (LASIX) 40 MG tablet Take 40 mg by mouth 3 (three) times daily.   Yes Historical Provider, MD  insulin aspart (NOVOLOG) 100 UNIT/ML injection Inject 12 Units into the skin 3 (three) times daily before meals.   Yes Historical Provider, MD  insulin NPH (HUMULIN N,NOVOLIN N) 100 UNIT/ML injection Inject 50 Units into the skin 2 (two) times daily before a meal.   Yes Historical Provider, MD  rosuvastatin (CRESTOR) 10 MG tablet Take 10 mg by mouth daily.   Yes Historical Provider, MD  spironolactone (ALDACTONE) 100 MG tablet Take 100 mg by mouth daily.   Yes Historical Provider, MD   Physical Exam: Filed Vitals:   06/28/12 0150 06/28/12 0219 06/28/12 0600  BP: 163/91  148/83  Pulse: 87  67  Temp: 98.4 F (36.9 C)    TempSrc: Oral    Resp: 22  18  Weight:  155.992 kg (343 lb 14.4 oz)   SpO2: 93%  93%    Physical Exam  Constitutional: Appears well-developed and well-nourished. No distress. Obese HENT: Normocephalic. External right and left ear normal. Oropharynx is clear and moist.  Eyes: Conjunctivae and EOM are normal. PERRLA, no scleral icterus.  Neck: Normal ROM. Neck supple. No JVD. No tracheal deviation. No thyromegaly.  CVS: RRR, S1/S2 +, no murmurs, no gallops, no carotid bruit.  Pulmonary: Effort and breath sounds normal, no stridor, rhonchi, wheezes, rales.  Abdominal: Soft. BS +,  Distended due to multiple abd hernias; non tender.  Musculoskeletal: Normal range of motion. No edema and no tenderness.  Lymphadenopathy: No lymphadenopathy noted, cervical, inguinal. Neuro: Alert. Normal reflexes, muscle tone coordination. No cranial nerve deficit. Skin: Skin is warm and dry. No rash noted. Not diaphoretic. No erythema. No pallor.  Psychiatric: Normal mood and affect. Behavior, judgment, thought content normal.   Labs on Admission:  Basic Metabolic  Panel:  Lab [id number] 06/28/12 0230  NA 139 134*  K 3.5 3.5  CL 101 96  CO2 -- 27  GLUCOSE 157* 156*  BUN 15 13  CREATININE 1.20 1.07  CALCIUM -- 9.4   Liver Function Tests:  Lab 06/28/12 0230  AST 19  ALT 13  ALKPHOS 28*  BILITOT 0.3  PROT 7.7  ALBUMIN 3.2*   CBC:  Lab 06/28/12 0244 06/28/12 0230  WBC -- 12.9*  HGB 17.3* 17.2*  HCT 51.0 48.0  MCV -- 94.5  PLT -- 227   Cardiac Enzymes:  Lab 06/28/12 0723 06/28/12 0230  CKTOTAL -- --  CKMB -- --  CKMBINDEX -- --  TROPONINI <0.30 <0.30   BNP: No components found with this basename: POCBNP:5 CBG:  Lab 06/28/12 0743  GLUCAP 48*    Radiological Exams on Admission: Ct Angio Chest Pe W/cm &/or Wo Cm 06/28/2012  * IMPRESSION:  1.  No evidence for acute pulmonary embolus.     Dg Chest Portable 1 View 06/28/2012  * IMPRESSION:  1.  No acute disease.      EKG: Normal sinus rhythm, no ST/T wave changes  Time spent: 100 minutes

## 2012-06-28 NOTE — ED Notes (Signed)
EKG printed and given to EDP Marnette Burgess for review. Last confirmed EKG printed for comparsion

## 2012-06-28 NOTE — ED Provider Notes (Signed)
History     CSN: [id number]  Arrival date & time 06/28/12  0137   First MD Initiated Contact with Patient 06/28/12 0217      Chief Complaint  Patient presents with  . Shortness of Breath    (Consider location/radiation/quality/duration/timing/severity/associated sxs/prior treatment) HPI HX per PT, episodes of SOB last night and again tonight. Noncompliant with CPAP.  No F/C, no cough. No CP. No DOE. No inc ankle swelling, denies h/o same. Wife concerned about his OSA because symptoms are occurring while he is sleeping.  PT feels well at this time. Mod in severity.   Past Medical History  Diagnosis Date  . Sleep apnea   . Diabetes mellitus   . Hypertension   . Hyperlipidemia   . CHF (congestive heart failure)   . Hernia     Past Surgical History  Procedure Date  . Appendectomy   . Tonsillectomy   . Hernia surgeries     multiple surgeries  . Nasal septum surgery     History reviewed. No pertinent family history.  History  Substance Use Topics  . Smoking status: Current Every Day Smoker  . Smokeless tobacco: Not on file  . Alcohol Use: Yes     occasional      Review of Systems  Constitutional: Negative for fever and chills.  HENT: Negative for neck pain and neck stiffness.   Eyes: Negative for pain.  Respiratory: Positive for shortness of breath.   Cardiovascular: Negative for chest pain and palpitations.  Gastrointestinal: Negative for abdominal pain.  Genitourinary: Negative for dysuria.  Musculoskeletal: Negative for back pain.  Skin: Negative for rash.  Neurological: Negative for headaches.  All other systems reviewed and are negative.    Allergies  Review of patient's allergies indicates no known allergies.  Home Medications   Current Outpatient Rx  Name Route Sig Dispense Refill  . CLONIDINE HCL 0.1 MG PO TABS Oral Take 0.1 mg by mouth 2 (two) times daily.    . FUROSEMIDE 40 MG PO TABS Oral Take 40 mg by mouth 3 (three) times daily.    .  INSULIN ASPART 100 UNIT/ML [place] SOLN Subcutaneous Inject 12 Units into the skin 3 (three) times daily before meals.    . INSULIN ISOPHANE HUMAN 100 UNIT/ML [place] SUSP Subcutaneous Inject 50 Units into the skin 2 (two) times daily before a meal.    . ROSUVASTATIN CALCIUM 10 MG PO TABS Oral Take 10 mg by mouth daily.    [name] Kitchen SPIRONOLACTONE 100 MG PO TABS Oral Take 100 mg by mouth daily.      BP 163/91  Pulse 87  Temp 98.4 F (36.9 C) (Oral)  Resp 22  Wt 343 lb 14.4 oz (155.992 kg)  SpO2 93%  Physical Exam  Constitutional: He is oriented to person, place, and time. He appears well-developed and well-nourished.  HENT:  Head: Normocephalic and atraumatic.  Eyes: Conjunctivae normal and EOM are normal. Pupils are equal, round, and reactive to light.  Neck: Trachea normal. Neck supple. No thyromegaly present.  Cardiovascular: Normal rate, regular rhythm, S1 normal, S2 normal and normal pulses.     No systolic murmur is present   No diastolic murmur is present  Pulses:      Radial pulses are 2+ on the right side, and 2+ on the left side.  Pulmonary/Chest: Effort normal and breath sounds normal. He has no wheezes. He has no rhonchi. He has no rales. He exhibits no tenderness.  Abdominal: Soft. Normal appearance and  bowel sounds are normal. There is no tenderness. There is no CVA tenderness and negative Murphy's sign.  Musculoskeletal:       BLE:s Calves nontender, no cords or erythema, negative Homans sign  Neurological: He is alert and oriented to person, place, and time. He has normal strength. No cranial nerve deficit or sensory deficit. GCS eye subscore is 4. GCS verbal subscore is 5. GCS motor subscore is 6.  Skin: Skin is warm and dry. No rash noted. He is not diaphoretic.  Psychiatric: His speech is normal.       Cooperative and appropriate    ED Course  Procedures (including critical care time)  Results for orders placed during the hospital encounter of 06/28/12  CBC      Component  Value Range   WBC 12.9 (*) 4.0 - 10.5 K/uL   RBC 5.08  4.22 - 5.81 MIL/uL   Hemoglobin 17.2 (*) 13.0 - 17.0 g/dL   HCT 48.0  39.0 - 52.0 %   MCV 94.5  78.0 - 100.0 fL   MCH 33.9  26.0 - 34.0 pg   MCHC 35.8  30.0 - 36.0 g/dL   RDW 13.3  11.5 - 15.5 %   Platelets 227  150 - 400 K/uL  COMPREHENSIVE METABOLIC PANEL      Component Value Range   Sodium 134 (*) 135 - 145 mEq/L   Potassium 3.5  3.5 - 5.1 mEq/L   Chloride 96  96 - 112 mEq/L   CO2 27  19 - 32 mEq/L   Glucose, Bld 156 (*) 70 - 99 mg/dL   BUN 13  6 - 23 mg/dL   Creatinine, Ser 1.07  0.50 - 1.35 mg/dL   Calcium 9.4  8.4 - 10.5 mg/dL   Total Protein 7.7  6.0 - 8.3 g/dL   Albumin 3.2 (*) 3.5 - 5.2 g/dL   AST 19  0 - 37 U/L   ALT 13  0 - 53 U/L   Alkaline Phosphatase 28 (*) 39 - 117 U/L   Total Bilirubin 0.3  0.3 - 1.2 mg/dL   GFR calc non Af Amer 74 (*) >90 mL/min   GFR calc Af Amer 86 (*) >90 mL/min  PRO B NATRIURETIC PEPTIDE      Component Value Range   Pro B Natriuretic peptide (BNP) 166.0 (*) 0 - 125 pg/mL  TROPONIN I      Component Value Range   Troponin I <0.30  <0.30 ng/mL  POCT I-STAT, CHEM 8      Component Value Range   Sodium 139  135 - 145 mEq/L   Potassium 3.5  3.5 - 5.1 mEq/L   Chloride 101  96 - 112 mEq/L   BUN 15  6 - 23 mg/dL   Creatinine, Ser 1.20  0.50 - 1.35 mg/dL   Glucose, Bld 157 (*) 70 - 99 mg/dL   Calcium, Ion 1.16  1.12 - 1.23 mmol/L   TCO2 27  0 - 100 mmol/L   Hemoglobin 17.3 (*) 13.0 - 17.0 g/dL   HCT 51.0  39.0 - 52.0 %   Ct Angio Chest Pe W/cm &/or Wo Cm  06/28/2012  *RADIOLOGY REPORT*  Clinical Data: Shortness of breath and hypoxia  CT ANGIOGRAPHY CHEST  Technique:  Multidetector CT imaging of the chest using the standard protocol during bolus administration of intravenous contrast. Multiplanar reconstructed images including MIPs were obtained and reviewed to evaluate the vascular anatomy.  Contrast: 137mL OMNIPAQUE IOHEXOL 350 MG/ML SOLN  Comparison: None  Findings: No axillary or  supraclavicular adenopathy.  There is no mediastinal or hilar adenopathy.  No pericardial or pleural effusion identified.  Calcifications along the distribution of the LAD coronary artery noted.  The pulmonary artery and its branches are patent.  No evidence for acute pulmonary embolus.  There is no enlarged mediastinal or hilar lymph nodes.  No airspace consolidation identified.  No suspicious pulmonary nodule or mass. Review of the visualized osseous structures is significant for mild multilevel degenerative disc disease.  Limited imaging through the upper abdomen is unremarkable.  IMPRESSION:  1.  No evidence for acute pulmonary embolus.   Original Report Authenticated By: Angelita Ingles, M.D.    Dg Chest Portable 1 View  06/28/2012  *RADIOLOGY REPORT*  Clinical Data: Short of breath  PORTABLE CHEST - 1 VIEW  Comparison: 03/27/2010  Findings: Left chest wall pacer device is noted with leads in the right atrial appendage and right ventricle.  The heart size is normal.  No effusions or edema.  No airspace consolidation.  IMPRESSION:  1.  No acute disease.   Original Report Authenticated By: Angelita Ingles, M.D.      Date: 06/28/2012  Rate: 81  Rhythm: paced  QRS Axis: indeterminate  Intervals: normal  ST/T Wave abnormalities: nonspecific ST/T changes  Conduction Disutrbances:nonspecific intraventricular conduction delay  Narrative Interpretation:   Old EKG Reviewed: unchanged  CXR, labs and ECG reviewed, RQ pulse ox drops to 80s with ambulating, sent for PE study neg for blood clot as above.  Case discussed as above with Dr Marvis Repress who feels PT requires admit for hypoxia.   6:28 AM d/w Dr Roel Cluck - will admit tele team 8  MDM  VS, old records and nursing notes reviewed.   Labs, ECG, CXR and Ct as above  MED admit        Teressa Lower, MD 06/28/12 [phone]

## 2012-06-28 NOTE — Progress Notes (Signed)
Patient CBG 222 needed to get 7units of SSI, 12units of meal coverage and 50units of NPH at the same time; was hypoglycemic this AM GBG-48. Dr Christen Bame informed and she stated not to give the meal coverage insulin and give only 25units of NPH for now. Will continue to assess patient.

## 2012-06-28 NOTE — Progress Notes (Signed)
Cm spoke with patient concerning dc planning. Cm consult for HF home screen. Pt agress to home screen, desires [facility] for dx ,management. Per ot choice [facility] to provide [facility] services upon discharge. Cm spoke with on-call Rn at [facility] Judson Roch to alert agency of new referral. Awating Md orders. Please fax orders to [facility] upon discharge. H/P, demographics faxed to [facility] at [phone]. Confirmation received. Pt has Dme at bedside. States having problems affording medications. Cm printed out applications from needy meds for Novolog, Humulin H, & Crestor.    Arlean Hopping [phone]

## 2012-06-28 NOTE — ED Notes (Signed)
Pt has difficulty breathing when he lies down,  Pt has history of breathing trouble,  Pt hasn't been on medications as should be and also hasn't used his oxygen therapy,  Pt has significant history and health issues,

## 2012-06-28 NOTE — ED Notes (Signed)
Pt wife [name] [phone] (c)

## 2012-06-28 NOTE — ED Notes (Signed)
SPO2 while ambulating 86% RA. RN Loyal Jacobson notified

## 2012-06-28 NOTE — ED Notes (Signed)
Pt noted to have a lower blood sugar.  MD Optiz made aware.  MD gave verbal order for D50.  D50 given and breakfast tray ordered for patient.  Pt currently drinking a coke. Pt in no distress. Pt remained alert and oriented throughout.  Pt aware of plan of care and awaiting transfer to floor. Admission MD has since left pt bedside.

## 2012-06-28 NOTE — ED Notes (Signed)
Admission MD at pt bedside.

## 2012-06-29 DIAGNOSIS — E119 Type 2 diabetes mellitus without complications: Secondary | ICD-10-CM

## 2012-06-29 DIAGNOSIS — I10 Essential (primary) hypertension: Secondary | ICD-10-CM

## 2012-06-29 DIAGNOSIS — R0609 Other forms of dyspnea: Secondary | ICD-10-CM

## 2012-06-29 LAB — GLUCOSE, CAPILLARY: Glucose-Capillary: 150 mg/dL — ABNORMAL HIGH (ref 70–99)

## 2012-06-29 LAB — BASIC METABOLIC PANEL
BUN: 11 mg/dL (ref 6–23)
CO2: 29 mEq/L (ref 19–32)
Calcium: 8.8 mg/dL (ref 8.4–10.5)
Creatinine, Ser: 1.19 mg/dL (ref 0.50–1.35)
Glucose, Bld: 131 mg/dL — ABNORMAL HIGH (ref 70–99)

## 2012-06-29 MED ORDER — CLONIDINE HCL 0.1 MG PO TABS
0.1000 mg | ORAL_TABLET | Freq: Three times a day (TID) | ORAL | Status: DC
  Administered 2012-06-29: 0.1 mg via ORAL
  Filled 2012-06-29 (×3): qty 1

## 2012-06-29 MED ORDER — OXYCODONE-ACETAMINOPHEN 5-325 MG PO TABS: 1.0000 | ORAL_TABLET | ORAL | Status: DC | PRN

## 2012-06-29 MED ORDER — SPIRONOLACTONE 100 MG PO TABS: 100.0000 mg | ORAL_TABLET | Freq: Every day | ORAL | Status: DC

## 2012-06-29 MED ORDER — ROSUVASTATIN CALCIUM 10 MG PO TABS: 10.0000 mg | ORAL_TABLET | Freq: Every day | ORAL | Status: DC

## 2012-06-29 MED ORDER — CLONIDINE HCL 0.1 MG PO TABS: 0.2000 mg | ORAL_TABLET | Freq: Two times a day (BID) | ORAL | Status: DC

## 2012-06-29 MED ORDER — ENOXAPARIN SODIUM 80 MG/0.8ML ~~LOC~~ SOLN: 75.0000 mg | SUBCUTANEOUS | Status: DC

## 2012-06-29 MED ORDER — INSULIN ASPART 100 UNIT/ML ~~LOC~~ SOLN: 12.0000 [IU] | Freq: Three times a day (TID) | SUBCUTANEOUS | Status: DC

## 2012-06-29 MED ORDER — FUROSEMIDE 40 MG PO TABS: 40.0000 mg | ORAL_TABLET | Freq: Three times a day (TID) | ORAL | Status: DC

## 2012-06-29 MED ORDER — POTASSIUM CHLORIDE CRYS ER 20 MEQ PO TBCR
40.0000 meq | EXTENDED_RELEASE_TABLET | Freq: Once | ORAL | Status: AC
  Administered 2012-06-29: 40 meq via ORAL
  Filled 2012-06-29: qty 2

## 2012-06-29 MED ORDER — CLOTRIMAZOLE 1 % EX SOLN
Freq: Two times a day (BID) | CUTANEOUS | Status: DC
  Administered 2012-06-29: 14:00:00 via TOPICAL
  Filled 2012-06-29: qty 10

## 2012-06-29 MED ORDER — HYDRALAZINE HCL 20 MG/ML IJ SOLN: 10.0000 mg | Freq: Four times a day (QID) | INTRAMUSCULAR | Status: DC | PRN

## 2012-06-29 MED ORDER — INSULIN NPH (HUMAN) (ISOPHANE) 100 UNIT/ML ~~LOC~~ SUSP: 50.0000 [IU] | Freq: Two times a day (BID) | SUBCUTANEOUS | Status: DC

## 2012-06-29 MED ORDER — CLOTRIMAZOLE 1 % EX SOLN: Freq: Two times a day (BID) | CUTANEOUS | Status: DC

## 2012-06-29 NOTE — Progress Notes (Signed)
[facility] called for home oxygen; B Leanne Chang, [employer]

## 2012-06-29 NOTE — Evaluation (Signed)
Physical Therapy Evaluation Patient Details Name: Kurt Cross MRN: [id number] DOB: 1951-01-20 Today's Date: 06/29/2012 Time: [id number] PT Time Calculation (min): 10 min  PT Assessment / Plan / Recommendation Clinical Impression  Pt.was admitted w/ worsening SOB. negative for PE. Pt's sats dropped to 85% on RA w/ HR 115. Pt. will benefit from PT to improve functional mobility and endurance to DC to home. Pt. may need home O2. Pt. also was not steady w/ just cane and recommended to pt. to use his RW until he gets stronger.    PT Assessment  Patient needs continued PT services    Follow Up Recommendations  No PT follow up    Barriers to Discharge        Equipment Recommendations  None recommended by PT    Recommendations for Other Services     Frequency Min 3X/week    Precautions / Restrictions Precautions Precautions: Fall Precaution Comments: sob   Pertinent Vitals/Pain See pulmonary section      Mobility  Transfers Transfers: Sit to Stand;Stand to Sit Sit to Stand: 5: Supervision;From chair/3-in-1;With upper extremity assist Stand to Sit: 5: Supervision;6: Modified independent (Device/Increase time);With armrests Details for Transfer Assistance: pt. did have some difficulty getting himself to sitting at edge of recliner due to habitus. Ambulation/Gait Ambulation/Gait Assistance: 4: Min guard Ambulation Distance (Feet): 125 Feet Assistive device: Straight cane Ambulation/Gait Assistance Details: Pt. has wide base. had to slow down due to dyspnea. Gait Pattern: Wide base of support;Step-through pattern Gait velocity: tends to lateral weight shift to advance legs. decreased    Exercises     PT Diagnosis:  difficulty walking PT Problem List:   PT Treatment Interventions: DME instruction;Gait training;Functional mobility training   PT Goals Acute Rehab PT Goals PT Goal Formulation: With patient Time For Goal Achievement: 07/13/12 Potential to Achieve Goals:  Good Pt will go Supine/Side to Sit: Independently PT Goal: Supine/Side to Sit - Progress: Goal set today Pt will go Sit to Supine/Side: Independently PT Goal: Sit to Supine/Side - Progress: Goal set today Pt will go Sit to Stand: with modified independence PT Goal: Sit to Stand - Progress: Goal set today Pt will go Stand to Sit: with modified independence PT Goal: Stand to Sit - Progress: Goal set today Pt will Ambulate: 51 - 150 feet;with modified independence;with least restrictive assistive device PT Goal: Ambulate - Progress: Goal set today  Visit Information  Last PT Received On: 06/29/12 Assistance Needed: +1    Subjective Data  Subjective: I need a new CpAP Patient Stated Goal: to walk   Prior Functioning  Home Living Lives With: Spouse Available Help at Discharge: Family Type of Home: House Home Access: Stairs to enter Technical brewer of Steps: 5 Entrance Stairs-Rails: Right Home Layout: One level Bathroom Shower/Tub: Chiropodist: Handicapped height Bathroom Accessibility: Yes Home Adaptive Equipment: Straight cane;Walker - rolling Prior Function Level of Independence: Independent with assistive device(s) Able to Take Stairs?: Yes Driving: Yes Communication Communication: No difficulties    Cognition  Overall Cognitive Status: Appears within functional limits for tasks assessed/performed Arousal/Alertness: Awake/alert Orientation Level: Appears intact for tasks assessed Behavior During Session: [facility] for tasks performed    Extremity/Trunk Assessment Right Upper Extremity Assessment RUE ROM/Strength/Tone: [facility] for tasks assessed Left Upper Extremity Assessment LUE ROM/Strength/Tone: [facility] for tasks assessed Right Lower Extremity Assessment RLE ROM/Strength/Tone: [facility] for tasks assessed Left Lower Extremity Assessment LLE ROM/Strength/Tone: [facility] for tasks assessed   Balance Static Sitting Balance Static Sitting - Balance Support:  No  upper extremity supported;Feet supported Static Sitting - Level of Assistance: 7: Independent  End of Session PT - End of Session Activity Tolerance: Patient limited by fatigue;Treatment limited secondary to medical complications (Comment) (dyspnea and decreased stats) Patient left: in chair;with call bell/phone within reach  GP     Claretha Cooper 06/29/2012, 1:11 PM  [place] PT [phone] QUALIFICATIONS:  Patient Saturations on Room Air at Rest =NT. On 3 l 96%  Patient Saturations on Room Air while Ambulating = 85%  Patient Saturations on  Liters of oxygen while Ambulating = %NT.   Statement of medical necessity for home oxygen:Pt desaturation of oxygen on RAdifficulty walking

## 2012-06-29 NOTE — Discharge Summary (Signed)
Physician Discharge Summary  Kurt Cross [id number] DOB: 06-06-1951 DOA: 06/28/2012  PCP: No primary provider on file.  Admit date: 06/28/2012 Discharge date: 06/29/2012  Recommendations for Outpatient Follow-up:  1. Followup with primary care physician in one to 2 weeks after discharge or sooner if symptoms worsen  Discharge Diagnoses:  Principal Problem:  *Shortness of breath Active Problems:  DM  HYPERLIPIDEMIA  OBESITY  OBSTRUCTIVE SLEEP APNEA  CONGESTIVE HEART FAILURE  Hypertension  Discharge Condition: Medically stable for discharge home today, patient agrees with the discharge plan  Diet recommendation: Heart healthy, carb modified  History of present illness:  61 year old male with history of congestive heart failure, diabetes, hypertension and dyslipidemia who presented to ED with complaints of worsening shortness of breath started one day prior to the admission.  Assessment and plan:   Principal Problem:  *Shortness of breath, desaturation  - obvious concern is for pulmonary embolism which has been ruled out with negative CT chest angio  - Patient does not appear to be at all short of breath - His acute episode of shortness of breath on admission is likely due to obstructive sleep apnea - based on 2 D ECHO in 2004, EF ~ 45%   Active Problems:  DM, uncontrolled with complications - Hemoglobin A1c is 9.9 on this admission likely secondary to noncompliance rather than in adequate insulin regimen - Continue current home insulin regimen   HYPERLIPIDEMIA  - Continue Statin   OBESITY  - Nutrition consulted  OBSTRUCTIVE SLEEP APNEA  - CPAP at nighttime   CONGESTIVE HEART FAILURE, systolic  - Based on 2-D echo in 2004, ejection fraction around 45%  - 2 sets of troponins are negative  - BNP on this admission only mildly elevated  - Continue Lasix PO and spironolactone   Hypertension  - Continue Clonidine per home regimen - Blood pressure 149/86  Code  Status: Full  Family Communication: Pt at bedside  Disposition Plan: Home today  Leisa Lenz, MD  [facility]  Pager [phone]  Discharge Exam: Filed Vitals:   06/28/12 2132  BP: 181/98  Pulse: 73  Temp: 98.2 F (36.8 C)  Resp: 18   Filed Vitals:   06/28/12 0908 06/28/12 1348 06/29/12 0450  BP: 159/77 149/86   Pulse: 89 74   Temp: 97.9 F (36.6 C) 97.7 F (36.5 C)   TempSrc: Oral Oral   Resp: 18 18   Height: 5\' 11"  (1.803 m)    Weight: 155 kg (341 lb 11.4 oz)  155.039 kg (341 lb 12.8 oz)  SpO2: 96% 98%     General: Pt is alert, follows commands appropriately, not in acute distress Cardiovascular: Regular rate and rhythm, S1/S2 +, no murmurs, no rubs, no gallops Respiratory: Clear to auscultation bilaterally, no wheezing, no crackles, no rhonchi Abdominal: Soft, non tender, non distended, bowel sounds +, no guarding Extremities: no edema, no cyanosis, pulses palpable bilaterally DP and PT Neuro: Grossly nonfocal  Discharge Instructions  Discharge Orders    Future Orders Please Complete By Expires   Diet - low sodium heart healthy      Increase activity slowly      Call MD for:  persistant nausea and vomiting      Call MD for:  severe uncontrolled pain      Call MD for:  difficulty breathing, headache or visual disturbances      Call MD for:  persistant dizziness or light-headedness          Medication List  As of 06/29/2012 11:32 AM    TAKE these medications         cloNIDine 0.1 MG tablet   Commonly known as: CATAPRES   Take 2 tablets (0.2 mg total) by mouth 2 (two) times daily.      clotrimazole 1 % external solution   Commonly known as: LOTRIMIN   Apply topically 2 (two) times daily.      furosemide 40 MG tablet   Commonly known as: LASIX   Take 1 tablet (40 mg total) by mouth 3 (three) times daily.      insulin aspart 100 UNIT/ML injection   Commonly known as: novoLOG   Inject 12 Units into the skin 3 (three) times daily before meals.      insulin  NPH 100 UNIT/ML injection   Commonly known as: HUMULIN N,NOVOLIN N   Inject 50 Units into the skin 2 (two) times daily before a meal.      oxyCODONE-acetaminophen 5-325 MG per tablet   Commonly known as: PERCOCET/ROXICET   Take 1-2 tablets by mouth every 4 (four) hours as needed for pain.      rosuvastatin 10 MG tablet   Commonly known as: CRESTOR   Take 1 tablet (10 mg total) by mouth daily.      spironolactone 100 MG tablet   Commonly known as: ALDACTONE   Take 1 tablet (100 mg total) by mouth daily.          The results of significant diagnostics from this hospitalization (including imaging, microbiology, ancillary and laboratory) are listed below for reference.    Significant Diagnostic Studies: Ct Angio Chest Pe W/cm &/or Wo Cm 06/28/2012    IMPRESSION:  1.  No evidence for acute pulmonary embolus.      Dg Chest Portable 1 View 06/28/2012  *  IMPRESSION:  1.  No acute disease.     Labs: Basic Metabolic Panel:  Lab [id number] 06/28/12 0244 06/28/12 0230  NA 138 139 134*  K 3.0* 3.5 3.5  CL 99 101 96  CO2 29 -- 27  GLUCOSE 131* 157* 156*  BUN 11 15 13   CREATININE 1.19 1.20 1.07  CALCIUM 8.8 -- 9.4   Liver Function Tests:  Lab 06/28/12 0230  AST 19  ALT 13  ALKPHOS 28*  BILITOT 0.3  PROT 7.7  ALBUMIN 3.2*   CBC:  Lab 06/28/12 0244 06/28/12 0230  WBC -- 12.9*  HGB 17.3* 17.2*  HCT 51.0 48.0  MCV -- 94.5  PLT -- 227   Cardiac Enzymes:  Lab 06/28/12 1940 06/28/12 1414 06/28/12 0723 06/28/12 0230  CKTOTAL -- -- -- --  CKMB -- -- -- --  CKMBINDEX -- -- -- --  TROPONINI <0.30 <0.30 <0.30 <0.30   BNP: BNP (last 3 results)  Basename 06/28/12 0230  PROBNP 166.0*   CBG:  Lab 06/29/12 0740 06/28/12 2138 06/28/12 1654 06/28/12 1243 06/28/12 0821  GLUCAP 150* 227* 222* 184* 143*    Time coordinating discharge: Over 30 minutes

## 2012-06-29 NOTE — Progress Notes (Signed)
SATURATION QUALIFICATIONS:  Patient Saturations on Room Air at Rest = 92%  Patient Saturations on Room Air while Ambulating = 87%  Patient Saturations on 2 Liters of oxygen while Ambulating = 91%  Statement of medical necessity for home oxygen:

## 2012-06-29 NOTE — Plan of Care (Signed)
Problem: Food- and Nutrition-Related Knowledge Deficit (NB-1.1) Goal: Nutrition education Formal process to instruct or train a patient/client in a skill or to impart knowledge to help patients/clients voluntarily manage or modify food choices and eating behavior to maintain or improve health.  Outcome: Completed/Met Date Met:  06/29/12 Knowledge deficit resolved with diet education on 9/23.   Comments:  Discussed and provided heart healthy nutrition handout obtained from ADA nutrition care manual. I have also educated the patient on food label reading. He reported he knows how to read food labels and identify heart healthy foods because his wife is a Marine scientist and she makes sure he eats well. He was without any nutrition related questions or concerns. He verbalized understanding of the nutrition information provided. Expect good compliance.   RD available for nutrition needs.   Loyce Dys [facility] [id number]

## 2012-06-29 NOTE — Progress Notes (Signed)
  Echocardiogram 2D Echocardiogram has been performed.  [place], [facility] 06/29/2012, 10:22 AM

## 2012-06-29 NOTE — Plan of Care (Signed)
Problem: Food- and Nutrition-Related Knowledge Deficit (NB-1.1) Goal: Nutrition education Formal process to instruct or train a patient/client in a skill or to impart knowledge to help patients/clients voluntarily manage or modify food choices and eating behavior to maintain or improve health.  Outcome: Completed/Met Date Met:  06/29/12 Knowledge deficit resolved with diet education on 9/23.

## 2012-06-30 NOTE — Progress Notes (Signed)
06/29/12 1311  PT G-Codes **NOT FOR INPATIENT CLASS**  Functional Assessment Tool Used clinical Judgement per chart review  Functional Limitation Mobility: Walking and moving around  Mobility: Walking and Moving Around Current Status [id number]) CI  Mobility: Walking and Moving Around Goal Status [id number]) CH    G- codes entered by Clide Dales, PT per chart review with reported clinical judgements for findings.  Tresa Endo , PT completed the evaluation on this patient, see eval performed on this day.

## 2012-08-24 ENCOUNTER — Encounter (HOSPITAL_COMMUNITY): Payer: Self-pay | Admitting: *Deleted

## 2012-08-24 ENCOUNTER — Emergency Department (HOSPITAL_COMMUNITY): Payer: Medicare Other

## 2012-08-24 ENCOUNTER — Inpatient Hospital Stay (HOSPITAL_COMMUNITY)
Admission: EM | Admit: 2012-08-24 | Discharge: 2012-08-27 | DRG: 291 | Disposition: A | Discharge status: Home / Self Care | Payer: Medicare Other | Attending: Internal Medicine | Admitting: Internal Medicine

## 2012-08-24 DIAGNOSIS — Z950 Presence of cardiac pacemaker: Secondary | ICD-10-CM

## 2012-08-24 DIAGNOSIS — E1122 Type 2 diabetes mellitus with diabetic chronic kidney disease: Secondary | ICD-10-CM | POA: Diagnosis present

## 2012-08-24 DIAGNOSIS — E119 Type 2 diabetes mellitus without complications: Secondary | ICD-10-CM

## 2012-08-24 DIAGNOSIS — E7800 Pure hypercholesterolemia, unspecified: Secondary | ICD-10-CM | POA: Diagnosis present

## 2012-08-24 DIAGNOSIS — Z9119 Patient's noncompliance with other medical treatment and regimen: Secondary | ICD-10-CM

## 2012-08-24 DIAGNOSIS — I428 Other cardiomyopathies: Secondary | ICD-10-CM | POA: Diagnosis present

## 2012-08-24 DIAGNOSIS — J9620 Acute and chronic respiratory failure, unspecified whether with hypoxia or hypercapnia: Secondary | ICD-10-CM

## 2012-08-24 DIAGNOSIS — R0602 Shortness of breath: Secondary | ICD-10-CM

## 2012-08-24 DIAGNOSIS — E785 Hyperlipidemia, unspecified: Secondary | ICD-10-CM | POA: Diagnosis present

## 2012-08-24 DIAGNOSIS — I5023 Acute on chronic systolic (congestive) heart failure: Principal | ICD-10-CM | POA: Diagnosis present

## 2012-08-24 DIAGNOSIS — Z91199 Patient's noncompliance with other medical treatment and regimen due to unspecified reason: Secondary | ICD-10-CM

## 2012-08-24 DIAGNOSIS — G4733 Obstructive sleep apnea (adult) (pediatric): Secondary | ICD-10-CM | POA: Diagnosis present

## 2012-08-24 DIAGNOSIS — I509 Heart failure, unspecified: Secondary | ICD-10-CM | POA: Diagnosis present

## 2012-08-24 DIAGNOSIS — IMO0001 Reserved for inherently not codable concepts without codable children: Secondary | ICD-10-CM | POA: Diagnosis present

## 2012-08-24 DIAGNOSIS — E669 Obesity, unspecified: Secondary | ICD-10-CM

## 2012-08-24 DIAGNOSIS — Z9981 Dependence on supplemental oxygen: Secondary | ICD-10-CM

## 2012-08-24 DIAGNOSIS — I10 Essential (primary) hypertension: Secondary | ICD-10-CM | POA: Diagnosis present

## 2012-08-24 DIAGNOSIS — Z6841 Body Mass Index (BMI) 40.0 and over, adult: Secondary | ICD-10-CM

## 2012-08-24 DIAGNOSIS — E662 Morbid (severe) obesity with alveolar hypoventilation: Secondary | ICD-10-CM | POA: Diagnosis present

## 2012-08-24 LAB — GLUCOSE, CAPILLARY
Glucose-Capillary: 226 mg/dL — ABNORMAL HIGH (ref 70–99)
Glucose-Capillary: 203 mg/dL — ABNORMAL HIGH (ref 70–99)

## 2012-08-24 LAB — COMPREHENSIVE METABOLIC PANEL
Albumin: 3.2 g/dL — ABNORMAL LOW (ref 3.5–5.2)
BUN: 13 mg/dL (ref 6–23)
Calcium: 9.3 mg/dL (ref 8.4–10.5)
Chloride: 100 mEq/L (ref 96–112)
Creatinine, Ser: 1.04 mg/dL (ref 0.50–1.35)
GFR calc non Af Amer: 77 mL/min — ABNORMAL LOW (ref >90)
Total Bilirubin: 0.9 mg/dL (ref 0.3–1.2)

## 2012-08-24 LAB — CBC WITH DIFFERENTIAL/PLATELET
Basophils Relative: 0 % (ref 0–1)
Eosinophils Relative: 1 % (ref 0–5)
HCT: 48.2 % (ref 39.0–52.0)
Hemoglobin: 17 g/dL (ref 13.0–17.0)
MCH: 33.3 pg (ref 26.0–34.0)
MCHC: 35.3 g/dL (ref 30.0–36.0)
MCV: 94.3 fL (ref 78.0–100.0)
Monocytes Absolute: 0.7 10*3/uL (ref 0.1–1.0)
Monocytes Relative: 7 % (ref 3–12)
Neutro Abs: 7.9 10*3/uL — ABNORMAL HIGH (ref 1.7–7.7)

## 2012-08-24 LAB — URINALYSIS, ROUTINE W REFLEX MICROSCOPIC
Glucose, UA: >1000 mg/dL — AB
Leukocytes, UA: NEGATIVE
Protein, ur: >300 mg/dL — AB
Specific Gravity, Urine: >1.046 — ABNORMAL HIGH (ref 1.005–1.030)
Urobilinogen, UA: 1 mg/dL (ref 0.0–1.0)

## 2012-08-24 LAB — PRO B NATRIURETIC PEPTIDE: Pro B Natriuretic peptide (BNP): 1036 pg/mL — ABNORMAL HIGH (ref 0–125)

## 2012-08-24 LAB — TROPONIN I: Troponin I: <0.3 ng/mL (ref <0.30)

## 2012-08-24 LAB — URINE MICROSCOPIC-ADD ON

## 2012-08-24 MED ORDER — INSULIN ASPART 100 UNIT/ML ~~LOC~~ SOLN
12.0000 [IU] | Freq: Two times a day (BID) | SUBCUTANEOUS | Status: DC
  Administered 2012-08-24 – 2012-08-25 (×2): 12 [IU] via SUBCUTANEOUS

## 2012-08-24 MED ORDER — SENNOSIDES-DOCUSATE SODIUM 8.6-50 MG PO TABS
1.0000 | ORAL_TABLET | Freq: Every evening | ORAL | Status: DC | PRN
  Filled 2012-08-24: qty 1

## 2012-08-24 MED ORDER — OXYCODONE-ACETAMINOPHEN 5-325 MG PO TABS
1.0000 | ORAL_TABLET | ORAL | Status: DC | PRN
  Administered 2012-08-24 – 2012-08-27 (×11): 2 via ORAL
  Filled 2012-08-24 (×11): qty 2

## 2012-08-24 MED ORDER — ONDANSETRON HCL 4 MG/2ML IJ SOLN: 4.0000 mg | Freq: Four times a day (QID) | INTRAMUSCULAR | Status: DC | PRN

## 2012-08-24 MED ORDER — INSULIN ASPART 100 UNIT/ML ~~LOC~~ SOLN
0.0000 [IU] | Freq: Three times a day (TID) | SUBCUTANEOUS | Status: DC
  Administered 2012-08-24: 8 [IU] via SUBCUTANEOUS
  Administered 2012-08-25 (×2): 3 [IU] via SUBCUTANEOUS
  Administered 2012-08-26: 2 [IU] via SUBCUTANEOUS

## 2012-08-24 MED ORDER — POTASSIUM CHLORIDE CRYS ER 20 MEQ PO TBCR
40.0000 meq | EXTENDED_RELEASE_TABLET | Freq: Two times a day (BID) | ORAL | Status: AC
  Administered 2012-08-24 – 2012-08-25 (×2): 40 meq via ORAL
  Filled 2012-08-24 (×2): qty 2

## 2012-08-24 MED ORDER — SODIUM CHLORIDE 0.9 % IJ SOLN
3.0000 mL | Freq: Two times a day (BID) | INTRAMUSCULAR | Status: DC
  Administered 2012-08-24 – 2012-08-27 (×4): 3 mL via INTRAVENOUS

## 2012-08-24 MED ORDER — FUROSEMIDE 10 MG/ML IJ SOLN
60.0000 mg | Freq: Once | INTRAMUSCULAR | Status: AC
  Administered 2012-08-24: 60 mg via INTRAVENOUS
  Filled 2012-08-24: qty 8

## 2012-08-24 MED ORDER — FUROSEMIDE 40 MG PO TABS
40.0000 mg | ORAL_TABLET | Freq: Three times a day (TID) | ORAL | Status: DC
  Administered 2012-08-24 – 2012-08-26 (×6): 40 mg via ORAL
  Filled 2012-08-24 (×8): qty 1

## 2012-08-24 MED ORDER — INSULIN NPH (HUMAN) (ISOPHANE) 100 UNIT/ML ~~LOC~~ SUSP
50.0000 [IU] | Freq: Two times a day (BID) | SUBCUTANEOUS | Status: DC
  Administered 2012-08-24 – 2012-08-25 (×2): 50 [IU] via SUBCUTANEOUS
  Filled 2012-08-24: qty 10

## 2012-08-24 MED ORDER — SPIRONOLACTONE 100 MG PO TABS
100.0000 mg | ORAL_TABLET | Freq: Every day | ORAL | Status: DC
  Administered 2012-08-24 – 2012-08-27 (×4): 100 mg via ORAL
  Filled 2012-08-24 (×4): qty 1

## 2012-08-24 MED ORDER — ACETAMINOPHEN 650 MG RE SUPP: 650.0000 mg | Freq: Four times a day (QID) | RECTAL | Status: DC | PRN

## 2012-08-24 MED ORDER — ACETAMINOPHEN 325 MG PO TABS: 650.0000 mg | ORAL_TABLET | Freq: Four times a day (QID) | ORAL | Status: DC | PRN

## 2012-08-24 MED ORDER — CLONIDINE HCL 0.2 MG PO TABS
0.2000 mg | ORAL_TABLET | Freq: Two times a day (BID) | ORAL | Status: DC
  Administered 2012-08-24 – 2012-08-27 (×6): 0.2 mg via ORAL
  Filled 2012-08-24 (×7): qty 1

## 2012-08-24 MED ORDER — SODIUM CHLORIDE 0.9 % IJ SOLN: 3.0000 mL | INTRAMUSCULAR | Status: DC | PRN

## 2012-08-24 MED ORDER — ATORVASTATIN CALCIUM 20 MG PO TABS
20.0000 mg | ORAL_TABLET | Freq: Every day | ORAL | Status: DC
  Administered 2012-08-24 – 2012-08-26 (×3): 20 mg via ORAL
  Filled 2012-08-24 (×4): qty 1

## 2012-08-24 MED ORDER — SODIUM CHLORIDE 0.9 % IJ SOLN
3.0000 mL | Freq: Two times a day (BID) | INTRAMUSCULAR | Status: DC
  Administered 2012-08-25: 3 mL via INTRAVENOUS

## 2012-08-24 MED ORDER — ENOXAPARIN SODIUM 80 MG/0.8ML ~~LOC~~ SOLN
80.0000 mg | SUBCUTANEOUS | Status: DC
  Administered 2012-08-24 – 2012-08-26 (×3): 80 mg via SUBCUTANEOUS
  Filled 2012-08-24 (×4): qty 0.8

## 2012-08-24 MED ORDER — INSULIN ASPART 100 UNIT/ML ~~LOC~~ SOLN
0.0000 [IU] | Freq: Every day | SUBCUTANEOUS | Status: DC
  Administered 2012-08-24 – 2012-08-26 (×2): 2 [IU] via SUBCUTANEOUS

## 2012-08-24 MED ORDER — ONDANSETRON HCL 4 MG PO TABS: 4.0000 mg | ORAL_TABLET | Freq: Four times a day (QID) | ORAL | Status: DC | PRN

## 2012-08-24 MED ORDER — LORCASERIN HCL 10 MG PO TABS: 1.0000 | ORAL_TABLET | Freq: Two times a day (BID) | ORAL | Status: DC

## 2012-08-24 MED ORDER — ENOXAPARIN SODIUM 40 MG/0.4ML ~~LOC~~ SOLN: 40.0000 mg | SUBCUTANEOUS | Status: DC

## 2012-08-24 MED ORDER — SODIUM CHLORIDE 0.9 % IV SOLN: 250.0000 mL | INTRAVENOUS | Status: DC | PRN

## 2012-08-24 NOTE — H&P (Signed)
Patient seen and examined by me.  Agree with plan to observe overnight and diurese.  Patient is not compliant with his medications- has not taken lasix for 2-3 days.  Kurt Bear DO

## 2012-08-24 NOTE — ED Provider Notes (Addendum)
History     CSN: [id number]  Arrival date & time 08/24/12  1021   First MD Initiated Contact with Patient 08/24/12 1031      No chief complaint on file.   (Consider location/radiation/quality/duration/timing/severity/associated sxs/prior treatment) HPI Comments: Patient normally wears 2 L of oxygen at home but states that he's been very short of breath with any activity or lying flat. He states last time he took his Lasix was Saturday because he's been too sick to take his medicine  Patient is a 61 y.o. male presenting with shortness of breath. The history is provided by the patient.  Shortness of Breath  Episode onset: 4 days. The problem has been gradually worsening. The problem is severe. The symptoms are relieved by rest. The symptoms are aggravated by a supine position and activity. Associated symptoms include orthopnea, cough and shortness of breath. Pertinent negatives include no chest pain, no chest pressure, no fever and no wheezing. The cough is non-productive. He has had no prior steroid use. He has had prior hospitalizations (Prior hospitalizations for CHF). Past medical history comments: No past history of asthma or COPD. Patient is a smoker.. Urine output has been normal. There were no sick contacts. He has received no recent medical care.    Past Medical History  Diagnosis Date  . Sleep apnea   . Diabetes mellitus   . Hypertension   . Hyperlipidemia   . CHF (congestive heart failure)   . Hernia     Past Surgical History  Procedure Date  . Appendectomy   . Tonsillectomy   . Hernia surgeries     multiple surgeries  . Nasal septum surgery     No family history on file.  History  Substance Use Topics  . Smoking status: Current Every Day Smoker  . Smokeless tobacco: Not on file  . Alcohol Use: Yes     Comment: occasional      Review of Systems  Constitutional: Negative for fever.  Respiratory: Positive for cough and shortness of breath. Negative for  wheezing.   Cardiovascular: Positive for orthopnea and leg swelling. Negative for chest pain.  Gastrointestinal: Negative for nausea, vomiting, abdominal pain and diarrhea.  Genitourinary: Negative for dysuria.  All other systems reviewed and are negative.    Allergies  Review of patient's allergies indicates no known allergies.  Home Medications   Current Outpatient Rx  Name  Route  Sig  Dispense  Refill  . CLONIDINE HCL 0.1 MG PO TABS   Oral   Take 2 tablets (0.2 mg total) by mouth 2 (two) times daily.   60 tablet   0   . CLOTRIMAZOLE 1 % EX SOLN   Topical   Apply topically 2 (two) times daily.   30 mL   2   . FUROSEMIDE 40 MG PO TABS   Oral   Take 1 tablet (40 mg total) by mouth 3 (three) times daily.   30 tablet   0   . INSULIN ASPART 100 UNIT/ML [place] SOLN   Subcutaneous   Inject 12 Units into the skin 3 (three) times daily before meals.   1 vial   0   . INSULIN ISOPHANE HUMAN 100 UNIT/ML [place] SUSP   Subcutaneous   Inject 50 Units into the skin 2 (two) times daily before a meal.   1 vial   0   . OXYCODONE-ACETAMINOPHEN 5-325 MG PO TABS   Oral   Take 1-2 tablets by mouth every 4 (four) hours as  needed for pain.   30 tablet   0   . ROSUVASTATIN CALCIUM 10 MG PO TABS   Oral   Take 1 tablet (10 mg total) by mouth daily.   30 tablet   0   . SPIRONOLACTONE 100 MG PO TABS   Oral   Take 1 tablet (100 mg total) by mouth daily.   30 tablet   0     BP 162/103  Pulse 103  Temp 97.7 F (36.5 C)  Resp 24  SpO2 95%  Physical Exam  Nursing note and vitals reviewed. Constitutional: He is oriented to person, place, and time. He appears well-developed and well-nourished. No distress.       Obese male  HENT:  Head: Normocephalic and atraumatic.  Mouth/Throat: Oropharynx is clear and moist.  Eyes: Conjunctivae normal and EOM are normal. Pupils are equal, round, and reactive to light.  Neck: Normal range of motion. Neck supple.  Cardiovascular: Regular  rhythm and intact distal pulses.  Tachycardia present.   No murmur heard. Pulmonary/Chest: Tachypnea noted. No respiratory distress. He has decreased breath sounds. He has no wheezes. He has no rhonchi. He has no rales.  Abdominal: Soft. He exhibits no distension. There is no tenderness. There is no rebound and no guarding.       Multiple abdominal surgical incisions with a soft reducible hernias  Musculoskeletal: Normal range of motion. He exhibits edema. He exhibits no tenderness.       1+ pitting edema to the midshin bilaterally  Neurological: He is alert and oriented to person, place, and time.  Skin: Skin is warm and dry. No rash noted. No erythema.  Psychiatric: He has a normal mood and affect. His behavior is normal.    ED Course  Procedures (including critical care time)  Labs Reviewed  CBC WITH DIFFERENTIAL - Abnormal; Notable for the following:    Neutrophils Relative 79 (*)     Neutro Abs 7.9 (*)     All other components within normal limits  COMPREHENSIVE METABOLIC PANEL - Abnormal; Notable for the following:    Potassium 3.4 (*)     Glucose, Bld 254 (*)     Albumin 3.2 (*)     Alkaline Phosphatase 30 (*)     GFR calc non Af Amer 77 (*)     GFR calc Af Amer 89 (*)     All other components within normal limits  PRO B NATRIURETIC PEPTIDE - Abnormal; Notable for the following:    Pro B Natriuretic peptide (BNP) 1036.0 (*)     All other components within normal limits  POCT I-STAT TROPONIN I  URINALYSIS, ROUTINE W REFLEX MICROSCOPIC   Dg Chest Port 1 View  08/24/2012  *RADIOLOGY REPORT*  Clinical Data: Shortness of breath, diabetes, hypertension, heart failure  PORTABLE CHEST - 1 VIEW  Comparison: 06/28/2012  Findings: Limited exam because of patient body habitus and portable technique.  Heart is enlarged with diffuse pulmonary edema pattern noted and small effusions compatible CHF.  Right costophrenic angle is excluded on the film.  Associated basilar atelectasis noted.  Left subclavian pacer present.  No pneumothorax.  IMPRESSION: Moderate CHF pattern.   Original Report Authenticated By: Jerilynn Mages. Annamaria Boots, M.D.      Date: 08/24/2012  Rate: 101  Rhythm: paced tachycardic rhythm  QRS Axis: indeterminate  Intervals: Paced rhythm  ST/T Wave abnormalities: indeterminate  Conduction Disutrbances:Atrial sensed ventricular paced rhythm  Narrative Interpretation:   Old EKG Reviewed: unchanged    1.  CHF (congestive heart failure)       MDM   Patient is here due to being short of breath. He has a history of diabetes, hypertension, hyperlipidemia, CHF and isn't every day smoker. He states on Thursday he became short of breath. He normally wears 2 L of oxygen at home however on arrival here he was satting 75% on 2 L. With increase of oxygen to 4 L he satting in the low 90s. He states he has not taken his Lasix in 2 days do to not feeling well. He denies any history of lung disease such as COPD. On exam he has 1+ edema in bilateral extremities. He is an obese male so it is difficult to tell if he has abdominal swelling. Breath sounds are globally decreased but heard in all lung fields. No notable rales. Chest x-[place], CMP, CBC, UA, BNP, EKG, troponin pending.  Concern for CHF versus other cardiac pathology versus PE. However patient denies any abdominal pain, leg pain or chest pain.  11:33 AM CXR with findings of CHF.  Labs with normal WBC and Hb.  EKG with persistent paced rhythm.  BNP today >1000.  Cr wnl.  Started on IV lasix and will admit.      Blanchie Dessert, MD 08/24/12 [place], MD 08/24/12 1229

## 2012-08-24 NOTE — H&P (Signed)
[facility] History and Physical  Kurt Cross [id number] DOB: Mar 26, 1951 DOA: 08/24/2012  Referring physician: Maryan Rued PCP: [name], MD  Specialists:   Chief Complaint: SOB  HPI: Kurt Cross is an interesting 61 y.o. male with pmh including but not limited to, DM, chronic resp failure, systolic HF hernia hyperlipidemia who presents to [facility] ED with cc SOB. Info obtained from pt who reluctantly participated in interview and exam. He reports that sob worsened 4 days ago when he "ran out of medicine". States he is always short of breath somewhat and sleeps with at least 2 pillows but in last 4 days this has worsened. Also reports moist productive cough. Denies CP, palpitation, dizziness, headache, fever, chills, n/v/diarrhea. States LEE somewhat worse.  Activity makes worse, rest makes better. In ED pt required 4L o2 to maintain sats above 90 and he reports home 02 dose is 2L. In addition chest xray consistent with CHF. Symptoms came on gradually, have persisted and worsened. Symptoms characterized as moderate to severe. Pt states he has a cardiologist and has a "defibrulator" but has not seen cards in at least 2 years.  [facility] asked to admit for further eval/treatment.    Review of Systems: The patient denies anorexia, fever, weight loss,, vision loss, decreased hearing, hoarseness, chest pain, syncope, balance deficits, hemoptysis, abdominal pain, melena, hematochezia, severe indigestion/heartburn, hematuria, incontinence, genital sores, muscle weakness, suspicious skin lesions, transient blindness, difficulty walking, depression, unusual weight change, abnormal bleeding, enlarged lymph nodes, angioedema, and breast masses.    Past Medical History  Diagnosis Date  . Sleep apnea   . Diabetes mellitus   . Hypertension   . Hyperlipidemia   . CHF (congestive heart failure)   . Hernia    Past Surgical History  Procedure Date  . Appendectomy   . Tonsillectomy   . Hernia surgeries       multiple surgeries  . Nasal septum surgery    Social History:  reports that he quit smoking 3 days ago. He has never used smokeless tobacco. He reports that he does not drink alcohol or use illicit drugs. Pt lives at home with wife and children. Independent with ADL's  No Known Allergies  History reviewed. No pertinent family history. CAD and HTN in mother and father  Prior to Admission medications   Medication Sig Start Date End Date Taking? Authorizing Provider  cloNIDine (CATAPRES) 0.1 MG tablet Take 0.2 mg by mouth 2 (two) times daily. 06/29/12  Yes Robbie Lis, MD  furosemide (LASIX) 40 MG tablet Take 40 mg by mouth 3 (three) times daily. 06/29/12  Yes Robbie Lis, MD  insulin aspart (NOVOLOG) 100 UNIT/ML injection Inject 12 Units into the skin 2 (two) times daily. 06/29/12  Yes Robbie Lis, MD  insulin NPH (HUMULIN N,NOVOLIN N) 100 UNIT/ML injection Inject 50 Units into the skin 2 (two) times daily before a meal. 06/29/12  Yes Robbie Lis, MD  Lorcaserin HCl (BELVIQ) 10 MG TABS Take 1 tablet by mouth 2 (two) times daily.   Yes Historical Provider, MD  oxyCODONE-acetaminophen (PERCOCET/ROXICET) 5-325 MG per tablet Take 1-2 tablets by mouth every 4 (four) hours as needed. For  pain 06/29/12  Yes Robbie Lis, MD  rosuvastatin (CRESTOR) 10 MG tablet Take 10 mg by mouth daily. 06/29/12  Yes Robbie Lis, MD  spironolactone (ALDACTONE) 100 MG tablet Take 100 mg by mouth daily. 06/29/12  Yes Robbie Lis, MD   Physical Exam: [name] Vitals:  08/24/12 1028  BP: 162/103  Pulse: 103  Temp: 97.7 F (36.5 C)  Resp: 24  SpO2: 95%     General:  Awake alert morbidly obese somewhat irritable. NAD  Eyes: PERRL EOMI no scleral icterus  ENT: external ears normal bilaterally, mucus membranes of mouth moist/pink. Very poor dentition. No drainage from nose  Neck: supple full rom  Cardiovascular: RRR No MGR trace -1+LEE PPP  Respiratory: moderate increased work of breathing. BS  somewhat diminished on left otherwise diffuse rhonchi and bilateral crackles  Abdomen: obese with very large hernia. Soft +BS but sluggish  Skin: warm/dry no rash  Musculoskeletal: MOE no joint swelling/erythema. Non-tender to palpation  Psychiatric: mood appropriate. Un-engaging during interview/exam. Apathetic appearing  Neurologic: speech clear facial symmetry. Cranial nerve II-XII intact   Labs on Admission:  Basic Metabolic Panel:  Lab [id number] 1050  NA 141  K 3.4*  CL 100  CO2 29  GLUCOSE 254*  BUN 13  CREATININE 1.04  CALCIUM 9.3  MG --  PHOS --   Liver Function Tests:  Lab 08/24/12 1050  AST 16  ALT 14  ALKPHOS 30*  BILITOT 0.9  PROT 7.1  ALBUMIN 3.2*   No results found for this basename: LIPASE:5,AMYLASE:5 in the last 168 hours No results found for this basename: AMMONIA:5 in the last 168 hours CBC:  Lab 08/24/12 1050  WBC 10.0  NEUTROABS 7.9*  HGB 17.0  HCT 48.2  MCV 94.3  PLT 238   Cardiac Enzymes: No results found for this basename: CKTOTAL:5,CKMB:5,CKMBINDEX:5,TROPONINI:5 in the last 168 hours  BNP (last 3 results)  Basename 08/24/12 1050 06/28/12 0230  PROBNP 1036.0* 166.0*   CBG: No results found for this basename: GLUCAP:5 in the last 168 hours  Radiological Exams on Admission: Dg Chest Port 1 View  08/24/2012  *RADIOLOGY REPORT*  Clinical Data: Shortness of breath, diabetes, hypertension, heart failure  PORTABLE CHEST - 1 VIEW  Comparison: 06/28/2012  Findings: Limited exam because of patient body habitus and portable technique.  Heart is enlarged with diffuse pulmonary edema pattern noted and small effusions compatible CHF.  Right costophrenic angle is excluded on the film.  Associated basilar atelectasis noted. Left subclavian pacer present.  No pneumothorax.  IMPRESSION: Moderate CHF pattern.   Original Report Authenticated By: Jerilynn Mages. Annamaria Boots, M.D.     EKG: Independently reviewed.   Assessment/Plan Principal Problem:   *Acute-on-chronic respiratory failure likely secondary to acute on chronic systolic HF: BNP 123XX123, chest xray consistent with CHF. Pt requiring  4L 02 to maintain sats at 90. Home 02 usually 2L. Will admit to tele. Will continue o2, monitor sats. Will give lasix IV, strict intake and output and daily weight. Last admission 06/28/12 for same.  Pt reports having "run out of medicine". Will request case manager to consult for evaluation of ability to get meds.  Active Problems:  DM: uncontrolled. Hg A1c 9.9 06/28/12. Will use SSI for glycemic control. Consider diabetes coordinator consult  HYPERLIPIDEMIA: continue home meds  OBESITY: nutritional consult  OBSTRUCTIVE SLEEP APNEA: scheduled for sleep study next month.   Hypertension: uncontrolled, suspect due to medication non-compliance related to finances. Will monitor and use IV lasix, continue aldactone. Will hold clonidine for now. Monitor closely and adjust meds as indicated  Acute on chronic systolic heart failure: due to medication non-compliance related to finances. BNP 1036. Wt 06/28/12 155kg. Current wt pending.  Echo in 9/22 yields EF 35%. Fluid restriction.  See therapy for problem #1.  IV lasix, strict intake/output,  daily weights.  Sleep apnea: scheduled for sleep study next month. Supplement 02. Continuous oxygen saturation monitoring.  Hypokalemia: mild likely related to diuresis. Will replete and recheck.    Code Status: full Family Communication: family at bedside  Disposition Plan: home when ready  Time spent: 71 minutes  Radene Gunning   NP [facility]   If 7PM-7AM, please contact night-coverage [email or web address] Password [email or web address] 08/24/2012, 1:20 PM

## 2012-08-24 NOTE — ED Notes (Signed)
Pt sounds congested, dry congested cough heard. Pt states when ambulates w/o oxygen shortness of breath is worse.

## 2012-08-24 NOTE — ED Notes (Signed)
Pt states he's been short of breath since Friday, states "I got tired of my wife telling me to come so I just came". Pt states he goes next week for a sleep study and to get fitted for a CPAP. Denies chest pain, dizziness. Pt states does have a cough w/ green mucus at times. Pt on 3L [place] at 95%.

## 2012-08-25 DIAGNOSIS — E119 Type 2 diabetes mellitus without complications: Secondary | ICD-10-CM

## 2012-08-25 LAB — CBC
HCT: 49.3 % (ref 39.0–52.0)
Hemoglobin: 17 g/dL (ref 13.0–17.0)
MCV: 96.3 fL (ref 78.0–100.0)
RBC: 5.12 MIL/uL (ref 4.22–5.81)
WBC: 10.3 10*3/uL (ref 4.0–10.5)

## 2012-08-25 LAB — BASIC METABOLIC PANEL
CO2: 33 mEq/L — ABNORMAL HIGH (ref 19–32)
Chloride: 96 mEq/L (ref 96–112)
Creatinine, Ser: 1.2 mg/dL (ref 0.50–1.35)
GFR calc Af Amer: 75 mL/min — ABNORMAL LOW (ref >90)
Potassium: 3.4 mEq/L — ABNORMAL LOW (ref 3.5–5.1)
Sodium: 138 mEq/L (ref 135–145)

## 2012-08-25 LAB — HEMOGLOBIN A1C: Mean Plasma Glucose: 229 mg/dL — ABNORMAL HIGH (ref <117)

## 2012-08-25 LAB — GLUCOSE, CAPILLARY
Glucose-Capillary: 180 mg/dL — ABNORMAL HIGH (ref 70–99)
Glucose-Capillary: 156 mg/dL — ABNORMAL HIGH (ref 70–99)
Glucose-Capillary: 94 mg/dL (ref 70–99)
Glucose-Capillary: 175 mg/dL — ABNORMAL HIGH (ref 70–99)

## 2012-08-25 MED ORDER — INSULIN NPH (HUMAN) (ISOPHANE) 100 UNIT/ML ~~LOC~~ SUSP
30.0000 [IU] | Freq: Every day | SUBCUTANEOUS | Status: DC
  Administered 2012-08-25 – 2012-08-26 (×2): 30 [IU] via SUBCUTANEOUS
  Filled 2012-08-25: qty 10

## 2012-08-25 MED ORDER — POTASSIUM CHLORIDE CRYS ER 20 MEQ PO TBCR
40.0000 meq | EXTENDED_RELEASE_TABLET | Freq: Once | ORAL | Status: AC
  Administered 2012-08-25: 40 meq via ORAL
  Filled 2012-08-25: qty 2

## 2012-08-25 MED ORDER — INSULIN ASPART 100 UNIT/ML ~~LOC~~ SOLN
6.0000 [IU] | Freq: Three times a day (TID) | SUBCUTANEOUS | Status: DC
  Administered 2012-08-26 (×2): 6 [IU] via SUBCUTANEOUS

## 2012-08-25 MED ORDER — HYDRALAZINE HCL 20 MG/ML IJ SOLN
5.0000 mg | Freq: Four times a day (QID) | INTRAMUSCULAR | Status: DC | PRN
  Filled 2012-08-25: qty 1

## 2012-08-25 MED ORDER — LISINOPRIL 10 MG PO TABS
10.0000 mg | ORAL_TABLET | Freq: Every day | ORAL | Status: DC
  Administered 2012-08-25 – 2012-08-26 (×2): 10 mg via ORAL
  Filled 2012-08-25 (×2): qty 1

## 2012-08-25 MED ORDER — FUROSEMIDE 10 MG/ML IJ SOLN
40.0000 mg | Freq: Once | INTRAMUSCULAR | Status: AC
  Administered 2012-08-25: 40 mg via INTRAVENOUS
  Filled 2012-08-25: qty 4

## 2012-08-25 MED ORDER — INSULIN NPH (HUMAN) (ISOPHANE) 100 UNIT/ML ~~LOC~~ SUSP
30.0000 [IU] | Freq: Every day | SUBCUTANEOUS | Status: DC
  Administered 2012-08-26 – 2012-08-27 (×2): 30 [IU] via SUBCUTANEOUS

## 2012-08-25 MED ORDER — POTASSIUM CHLORIDE CRYS ER 20 MEQ PO TBCR: 40.0000 meq | EXTENDED_RELEASE_TABLET | ORAL | Status: DC

## 2012-08-25 MED ORDER — ALBUTEROL SULFATE (5 MG/ML) 0.5% IN NEBU: 2.5000 mg | INHALATION_SOLUTION | RESPIRATORY_TRACT | Status: DC | PRN

## 2012-08-25 NOTE — Progress Notes (Signed)
[facility] PROGRESS NOTE  Kurt Cross [id number] DOB: 05-Sep-1951 DOA: 08/24/2012 PCP: [name], MD  Assessment/Plan: Acute-on-chronic respiratory failure likely secondary to acute on chronic systolic HF: BNP 123XX123, chest xray consistent with CHF. Pt still requiring 4L 02 to maintain sats at 90. Home 02 usually 2L. Not much improvement this am. Wt up 158.3 from 156.4 on admission. Improved air flow with wheezes/rhonchi.  Decreased crackles. Volume status -2.5L.  Will continue o2, monitor sats. Will give lasix one dose IV in addition to po daily doses. Will provide nebs for wheezing. Continue strict intake and output and daily weight. Last admission 06/28/12 for same. Pt reports having "run out of medicine". Will request case manager to consult for evaluation of ability to get meds.   Active Problems:  DM: uncontrolled. Hg A1c 9.9 06/28/12. Will use SSI for glycemic control as well as home regimen. CBG range 203-300. Will monitor and consider adjustment once acute illness resolves. Consider diabetes coordinator consult   HYPERLIPIDEMIA: continue home meds   OBESITY: nutritional consult   OBSTRUCTIVE SLEEP APNEA: scheduled for sleep study next month.   Hypertension: uncontrolled, suspect due to medication non-compliance related to finances. Clonidine, aldactone and lasix. If no improved control as #1 resolves will consider another agent. Will use prn in meantime.    Acute on chronic systolic heart failure: due to medication non-compliance related to finances. BNP 1036. Wt 06/28/12 155kg up to 158.3. Echo in 9/22 yields EF 35%.  Continue fluid restriction. See therapy for problem #1. IV lasix one dose in addition to po regimen, strict intake/output, daily weights.   Sleep apnea: scheduled for sleep study next month. Supplement 02. Continuous oxygen saturation monitoring.  Hypokalemia: mild likely related to diuresis. Will replete and recheck.   Code Status: full Family Communication:  pt at bedside Disposition Plan: home when ready hopefully 24-48 hours   Consultants:  none  Procedures:  none  Antibiotics:    HPI/Subjective: Sitting straight up in bed. NAD reports not sleeping well last night. Reports slight improvement in breathing. States legs feel tight  Objective: Filed Vitals:   08/24/12 2054 08/25/12 0200 08/25/12 0500 08/25/12 0601  BP: 162/106 155/99  162/101  Pulse: 94   105  Temp: 98.2 F (36.8 C)   98 F (36.7 C)  TempSrc: Oral   Oral  Resp: 22   24  Height:      Weight:   158.3 kg (348 lb 15.8 oz)   SpO2: 97%   93%    Intake/Output Summary (Last 24 hours) at 08/25/12 0826 Last data filed at 08/25/12 0756  Gross per 24 hour  Intake    840 ml  Output   3100 ml  Net  -2260 ml   Filed Weights   08/24/12 1340 08/25/12 0500  Weight: 156.491 kg (345 lb) 158.3 kg (348 lb 15.8 oz)    Exam:   General:  Awake alert obese NAD  Cardiovascular: RRR trace-1+LEE PPP  Respiratory: mild increased work of breathing. Difficulty completing sentences. Slight use of accessory muscles. Air flow diminished with expiratory wheeze and coarse. Decreased in bases  Abdomen: obese with large hernia. +BS non-tender to palp  Data Reviewed: Basic Metabolic Panel:  Lab [id number] 0209 08/24/12 1050  NA 138 141  K 3.4* 3.4*  CL 96 100  CO2 33* 29  GLUCOSE 101* 254*  BUN 18 13  CREATININE 1.20 1.04  CALCIUM 9.5 9.3  MG -- --  PHOS -- --   Liver Function  Tests:  Lab 08/24/12 1050  AST 16  ALT 14  ALKPHOS 30*  BILITOT 0.9  PROT 7.1  ALBUMIN 3.2*   No results found for this basename: LIPASE:5,AMYLASE:5 in the last 168 hours No results found for this basename: AMMONIA:5 in the last 168 hours CBC:  Lab 08/25/12 0209 08/24/12 1050  WBC 10.3 10.0  NEUTROABS -- 7.9*  HGB 17.0 17.0  HCT 49.3 48.2  MCV 96.3 94.3  PLT 233 238   Cardiac Enzymes:  Lab 08/25/12 0209 08/24/12 1952 08/24/12 1513  CKTOTAL -- -- --  CKMB -- -- --  CKMBINDEX --  -- --  TROPONINI <0.30 <0.30 <0.30   BNP (last 3 results)  Basename 08/24/12 1050 06/28/12 0230  PROBNP 1036.0* 166.0*   CBG:  Lab 08/24/12 2049 08/24/12 1710 08/24/12 1413  GLUCAP 203* 300* 226*    No results found for this or any previous visit (from the past 240 hour(s)).   Studies: Dg Chest Port 1 View  08/24/2012  *RADIOLOGY REPORT*  Clinical Data: Shortness of breath, diabetes, hypertension, heart failure  PORTABLE CHEST - 1 VIEW  Comparison: 06/28/2012  Findings: Limited exam because of patient body habitus and portable technique.  Heart is enlarged with diffuse pulmonary edema pattern noted and small effusions compatible CHF.  Right costophrenic angle is excluded on the film.  Associated basilar atelectasis noted. Left subclavian pacer present.  No pneumothorax.  IMPRESSION: Moderate CHF pattern.   Original Report Authenticated By: Jerilynn Mages. Shick, M.D.     Scheduled Meds:   . atorvastatin  20 mg Oral q1800  . cloNIDine  0.2 mg Oral BID  . enoxaparin (LOVENOX) injection  80 mg Subcutaneous Q24H  . furosemide  40 mg Intravenous Once  . [COMPLETED] furosemide  60 mg Intravenous Once  . furosemide  40 mg Oral TID  . insulin aspart  0-15 Units Subcutaneous TID WC  . insulin aspart  0-5 Units Subcutaneous QHS  . insulin aspart  12 Units Subcutaneous BID WC  . insulin NPH  50 Units Subcutaneous BID AC  . potassium chloride  40 mEq Oral BID  . potassium chloride  40 mEq Oral Q4H  . potassium chloride  40 mEq Oral Once  . sodium chloride  3 mL Intravenous Q12H  . sodium chloride  3 mL Intravenous Q12H  . spironolactone  100 mg Oral Daily  . [DISCONTINUED] enoxaparin (LOVENOX) injection  40 mg Subcutaneous Q24H  . [DISCONTINUED] Lorcaserin HCl  1 tablet Oral BID   Continuous Infusions:   Principal Problem:  *Acute-on-chronic respiratory failure Active Problems:  DM  HYPERLIPIDEMIA  OBESITY  OBSTRUCTIVE SLEEP APNEA  Hypertension  Acute on chronic systolic heart  failure    Time spent: 40 minutes    Radene Gunning NP [facility] 8PM-8AM, please contact night-coverage at [email or web address], password [email or web address] 08/25/2012, 8:26 AM  LOS: 1 day

## 2012-08-25 NOTE — Progress Notes (Signed)
Patient seen and examined by me.  BP elevated- will add ACE as EF 35-40 on last echo.  Patient is not complaint with taking medications due to financial issues- case manager to see.  Will give another dose of IV lasix and check BMP in AM.  Diet is also an issue with compliance.  Lungs sound clearer now.  Hope for d/c tomm.  Eulogio Bear DO

## 2012-08-25 NOTE — Care Management Note (Signed)
    Page 1 of 1   08/27/2012     11:03:11 AM   CARE MANAGEMENT NOTE 08/27/2012  Patient:  Kurt Cross,Kurt Cross   Account Number:  [id number]  Date Initiated:  08/25/2012  Documentation initiated by:  Dessa Phi  Subjective/Objective Assessment:   ADMITTED W/SOB.CHF.     Action/Plan:   FROM HOME W/SPOUSE.HAS PCP-JOHN KIM,PHARMACY, HAS HOME 02-[facility].   Anticipated DC Date:  08/27/2012   Anticipated DC Plan:  [place]  CM consult      Choice offered to / List presented to:  C-1 Patient        [place] arranged  HH-1 RN      [place].   Status of service:  Completed, signed off Medicare Important Message given?   (If response is "NO", the following Medicare IM given date fields will be blank) Date Medicare IM given:   Date Additional Medicare IM given:    Discharge Disposition:  [place]  Per UR Regulation:  Reviewed for med. necessity/level of care/duration of stay  If discussed at [place] of Stay Meetings, dates discussed:    Comments:  08/27/12 Shyla Gayheart RN,BSN NCM [phone] [facility] SUSAN([place]) AWARE OF D/C HOME [place] RN ORDERS.  08/26/12 Joell Buerger RN,BSN NCM [place] KRISTEN([place])INFORMED OF HH-CHF PROTOCAL,& FOLLOWING.WILL NEED HHRN ORDER IF MD AGREE.  08/25/12 Vishnu Moeller RN,BSN NCM [phone]

## 2012-08-25 NOTE — Progress Notes (Addendum)
Inpatient Diabetes Program Recommendations  AACE/ADA: New Consensus Statement on Inpatient Glycemic Control (2013)  Target Ranges:  Prepandial:   less than 140 mg/dL      Peak postprandial:   less than 180 mg/dL (1-2 hours)      Critically ill patients:  140 - 180 mg/dL   Reason for Visit: NPH pm dose: Strongly recommend to be given at HS Will get better fasting glucose control if given at [facility]  Inpatient Diabetes Program Recommendations Insulin - Basal: Noted NPH ordered bid with meals.  However, pm NPH is recommended to be given at HS rather than before dinner/supper.  Its duration of action runs out before the early am hours if given with supper.  Pt  getting supper meal coverage and HS correction coverage.  Would benefit changing the pm NPH dose to HS. Diet: added carbohydrate modified to diet orders with co-sign required.  Heart healthy diet alone has more carbohydrate content than a  Regular diet.  Note: Thank you, Rosita Kea, RN, CNS, Diabetes Coordinator [phone])   Addendum.  Just spoke with patient. He states he takes his pm NPH around 7 or 8 pm and that his fasting glucose is usually 90-110 mg/dL.  He checks at home aruond 7 am or before. If given here close to this same time, fasting glucose should be okay. Thank you, Rosita Kea, RN, CNS, Diabetes Coordinator [phone])

## 2012-08-26 DIAGNOSIS — I509 Heart failure, unspecified: Secondary | ICD-10-CM

## 2012-08-26 DIAGNOSIS — G4733 Obstructive sleep apnea (adult) (pediatric): Secondary | ICD-10-CM

## 2012-08-26 LAB — CBC
Hemoglobin: 15.7 g/dL (ref 13.0–17.0)
Platelets: 205 10*3/uL (ref 150–400)
RBC: 4.85 MIL/uL (ref 4.22–5.81)
WBC: 8.9 10*3/uL (ref 4.0–10.5)

## 2012-08-26 LAB — BASIC METABOLIC PANEL
CO2: 36 mEq/L — ABNORMAL HIGH (ref 19–32)
Glucose, Bld: 65 mg/dL — ABNORMAL LOW (ref 70–99)
Potassium: 3.4 mEq/L — ABNORMAL LOW (ref 3.5–5.1)
Sodium: 140 mEq/L (ref 135–145)

## 2012-08-26 LAB — GLUCOSE, CAPILLARY
Glucose-Capillary: 106 mg/dL — ABNORMAL HIGH (ref 70–99)
Glucose-Capillary: 114 mg/dL — ABNORMAL HIGH (ref 70–99)

## 2012-08-26 MED ORDER — LISINOPRIL 10 MG PO TABS
10.0000 mg | ORAL_TABLET | Freq: Two times a day (BID) | ORAL | Status: DC
  Administered 2012-08-26: 10 mg via ORAL
  Filled 2012-08-26 (×2): qty 1

## 2012-08-26 MED ORDER — FUROSEMIDE 10 MG/ML IJ SOLN
40.0000 mg | Freq: Two times a day (BID) | INTRAMUSCULAR | Status: DC
  Administered 2012-08-26 – 2012-08-27 (×2): 40 mg via INTRAVENOUS
  Filled 2012-08-26 (×4): qty 4

## 2012-08-26 MED ORDER — LISINOPRIL 10 MG PO TABS
10.0000 mg | ORAL_TABLET | Freq: Two times a day (BID) | ORAL | Status: DC
Start: 2012-08-27 — End: 2012-08-27
  Administered 2012-08-27: 10 mg via ORAL
  Filled 2012-08-26 (×2): qty 1

## 2012-08-26 MED ORDER — CARVEDILOL 6.25 MG PO TABS
6.2500 mg | ORAL_TABLET | Freq: Two times a day (BID) | ORAL | Status: DC
  Administered 2012-08-26 – 2012-08-27 (×2): 6.25 mg via ORAL
  Filled 2012-08-26 (×4): qty 1

## 2012-08-26 MED ORDER — FUROSEMIDE 10 MG/ML IJ SOLN
40.0000 mg | Freq: Three times a day (TID) | INTRAMUSCULAR | Status: DC
  Filled 2012-08-26 (×3): qty 4

## 2012-08-26 NOTE — Consult Note (Signed)
Reason for Consult: Decompensated systolic heart failure Referring Physician: [facility]  Kurt Cross is an 61 y.o. male.  HPI: Patient is 61 year old male with past medical history significant for mild coronary artery disease, history of nonischemic dilated cardiomyopathy EF of approximately 40%, history of systolic heart failure, history of complete heart block status post permanent pacemaker, hypertension, insulin requiring diabetes mellitus, morbid obesity, obstructive sleep apnea/obesity hypoventilation syndrome, morbid obesity, hypercholesteremia, history of ventral hernia, was admitted 2 days ago because of progressive increasing shortness of breath associated with leg swelling for last 4-5 days. Patient also gives history of PND orthopnea leg swelling. Denies any cough fever or chills. Denies any chest pain nausea vomiting diaphoresis. Denies palpitation lightheadedness or syncope. Denies any noncompliance to diet or medication. Patient was started on IV Lasix with good diuresis with improvement in his symptoms. Patient was ruled out for myocardial infarction. Patient had cardiac cath in 2008 which showed mild coronary artery disease with EF of approximately 40%. Patient states he was on CPAP at home in the past and was told to use on when necessary basis as he was doing well and rescheduled for sleep study in the future.  Past Medical History  Diagnosis Date  . Sleep apnea   . Diabetes mellitus   . Hypertension   . Hyperlipidemia   . CHF (congestive heart failure)   . Hernia     Past Surgical History  Procedure Date  . Appendectomy   . Tonsillectomy   . Hernia surgeries     multiple surgeries  . Nasal septum surgery     History reviewed. No pertinent family history.  Social History:  reports that he quit smoking 5 days ago. He has never used smokeless tobacco. He reports that he does not drink alcohol or use illicit drugs.  Allergies: No Known Allergies  Medications:  I have reviewed the patient's current medications.  Results for orders placed during the hospital encounter of 08/24/12 (from the past 48 hour(s))  GLUCOSE, CAPILLARY     Status: Abnormal   Collection Time   08/24/12  2:13 PM      Component Value Range Comment   Glucose-Capillary 226 (*) 70 - 99 mg/dL   TROPONIN I     Status: Normal   Collection Time   08/24/12  3:13 PM      Component Value Range Comment   Troponin I <0.30  <0.30 ng/mL   HEMOGLOBIN A1C     Status: Abnormal   Collection Time   08/24/12  3:13 PM      Component Value Range Comment   Hemoglobin A1C 9.6 (*) <5.7 %    Mean Plasma Glucose 229 (*) <117 mg/dL   GLUCOSE, CAPILLARY     Status: Abnormal   Collection Time   08/24/12  5:10 PM      Component Value Range Comment   Glucose-Capillary 300 (*) 70 - 99 mg/dL   TROPONIN I     Status: Normal   Collection Time   08/24/12  7:52 PM      Component Value Range Comment   Troponin I <0.30  <0.30 ng/mL   GLUCOSE, CAPILLARY     Status: Abnormal   Collection Time   08/24/12  8:49 PM      Component Value Range Comment   Glucose-Capillary 203 (*) 70 - 99 mg/dL   TROPONIN I     Status: Normal   Collection Time   08/25/12  2:09 AM  Component Value Range Comment   Troponin I <0.30  <0.30 ng/mL   BASIC METABOLIC PANEL     Status: Abnormal   Collection Time   08/25/12  2:09 AM      Component Value Range Comment   Sodium 138  135 - 145 mEq/L    Potassium 3.4 (*) 3.5 - 5.1 mEq/L    Chloride 96  96 - 112 mEq/L    CO2 33 (*) 19 - 32 mEq/L    Glucose, Bld 101 (*) 70 - 99 mg/dL    BUN 18  6 - 23 mg/dL    Creatinine, Ser 1.20  0.50 - 1.35 mg/dL    Calcium 9.5  8.4 - 10.5 mg/dL    GFR calc non Af Amer 65 (*) >90 mL/min    GFR calc Af Amer 75 (*) >90 mL/min   CBC     Status: Normal   Collection Time   08/25/12  2:09 AM      Component Value Range Comment   WBC 10.3  4.0 - 10.5 K/uL    RBC 5.12  4.22 - 5.81 MIL/uL    Hemoglobin 17.0  13.0 - 17.0 g/dL    HCT 49.3  39.0 -  52.0 %    MCV 96.3  78.0 - 100.0 fL    MCH 33.2  26.0 - 34.0 pg    MCHC 34.5  30.0 - 36.0 g/dL    RDW 14.5  11.5 - 15.5 %    Platelets 233  150 - 400 K/uL   GLUCOSE, CAPILLARY     Status: Abnormal   Collection Time   08/25/12  8:13 AM      Component Value Range Comment   Glucose-Capillary 180 (*) 70 - 99 mg/dL   GLUCOSE, CAPILLARY     Status: Abnormal   Collection Time   08/25/12 11:41 AM      Component Value Range Comment   Glucose-Capillary 156 (*) 70 - 99 mg/dL   GLUCOSE, CAPILLARY     Status: Normal   Collection Time   08/25/12  4:58 PM      Component Value Range Comment   Glucose-Capillary 94  70 - 99 mg/dL   GLUCOSE, CAPILLARY     Status: Abnormal   Collection Time   08/25/12  8:42 PM      Component Value Range Comment   Glucose-Capillary 175 (*) 70 - 99 mg/dL    Comment 1 Documented in Chart      Comment 2 Notify RN     CBC     Status: Normal   Collection Time   08/26/12  5:19 AM      Component Value Range Comment   WBC 8.9  4.0 - 10.5 K/uL    RBC 4.85  4.22 - 5.81 MIL/uL    Hemoglobin 15.7  13.0 - 17.0 g/dL    HCT 47.0  39.0 - 52.0 %    MCV 96.9  78.0 - 100.0 fL    MCH 32.4  26.0 - 34.0 pg    MCHC 33.4  30.0 - 36.0 g/dL    RDW 14.5  11.5 - 15.5 %    Platelets 205  150 - 400 K/uL   BASIC METABOLIC PANEL     Status: Abnormal   Collection Time   08/26/12  5:19 AM      Component Value Range Comment   Sodium 140  135 - 145 mEq/L    Potassium 3.4 (*) 3.5 - 5.1 mEq/L  Chloride 99  96 - 112 mEq/L    CO2 36 (*) 19 - 32 mEq/L    Glucose, Bld 65 (*) 70 - 99 mg/dL    BUN 19  6 - 23 mg/dL    Creatinine, Ser 1.32  0.50 - 1.35 mg/dL    Calcium 8.9  8.4 - 10.5 mg/dL    GFR calc non Af Amer 57 (*) >90 mL/min    GFR calc Af Amer 67 (*) >90 mL/min   GLUCOSE, CAPILLARY     Status: Abnormal   Collection Time   08/26/12  8:00 AM      Component Value Range Comment   Glucose-Capillary 106 (*) 70 - 99 mg/dL   GLUCOSE, CAPILLARY     Status: Abnormal   Collection Time    08/26/12 11:47 AM      Component Value Range Comment   Glucose-Capillary 128 (*) 70 - 99 mg/dL     No results found.  Review of Systems  Constitutional: Negative for fever and chills.  HENT: Negative for hearing loss.   Eyes: Negative for blurred vision.  Respiratory: Positive for shortness of breath. Negative for cough, hemoptysis and sputum production.   Cardiovascular: Positive for orthopnea, leg swelling and PND. Negative for chest pain, palpitations and claudication.  Gastrointestinal: Negative for nausea, vomiting and abdominal pain.  Neurological: Negative for dizziness and headaches.   Blood pressure 157/87, pulse 110, temperature 97.2 F (36.2 C), temperature source Oral, resp. rate 21, height 5\' 11"  (1.803 m), weight 155.538 kg (342 lb 14.4 oz), SpO2 93.00%. Physical Exam  Constitutional: He is oriented to person, place, and time. He appears well-developed and well-nourished.  HENT:  Head: Normocephalic and atraumatic.  Nose: Nose normal.  Mouth/Throat: No oropharyngeal exudate.  Eyes: Conjunctivae normal are normal. Pupils are equal, round, and reactive to light. Left eye exhibits no discharge. No scleral icterus.  Neck: Neck supple. No JVD present. No tracheal deviation present. No thyromegaly present.  Cardiovascular: Normal rate and regular rhythm.   Murmur (Soft systolic murmur noted and S3 gallop) heard. Respiratory:       Decreased breath sound at bases  GI: Soft. Bowel sounds are normal. He exhibits distension. There is no tenderness.  Musculoskeletal:       No clubbing cyanosis trace edema  Neurological: He is alert and oriented to person, place, and time.    Assessment/Plan: Resolving decompensated systolic heart failure Nonischemic cardiomyopathy Status post complete heart block in the past status post permanent pacemaker Hypertension Insulin requiring diabetes mellitus Obstructive sleep apnea/obesity hypoventilation Morbid  obesity Hypercholesteremia Morbid obesity Ventral hernia Plan Agree with present management Add carvedilol as per orders Reduced and diuretics and increase ACE inhibitors Monitor renal function Agree with sleep studies as outpatient Discussed with patient at length regarding lifestyle modification, and diet exercise and weight reduction.  Garen Woolbright N 08/26/2012, 12:29 PM

## 2012-08-26 NOTE — Progress Notes (Signed)
Patient ID: Kurt Cross  male  [id number]    DOB: 1951-09-20    DOA: 08/24/2012  PCP: [name], MD  Assessment/Plan: Principal Problem:  *Acute-on-chronic respiratory failure likely secondary to acute on chronic CHF: Still symptomatic, requiring 4 L O2 - Change Lasix to IV, continue spironolactone, cardiology consult obtained, repeat BNP in AM - Patient is not adherent with diet, medications and follow-ups, d/w Dr Terrence Dupont in detail, patient did not follow-up after 2008 - on ACE-, coreg  Active Problems: Diabetes mellitus: - Currently controlled inpatient however outpatient poor control with hemoglobin A1c 9.6   HYPERLIPIDEMIA   OBESITY: patient counseled on weight control and life style modifications   OBSTRUCTIVE SLEEP APNEA: per patient, has appointment for CPAP/sleep study outpatient 12/15   Hypertension: uncontrolled, added coreg    DVT Prophylaxis:Lovenox  Code Status:Full code  Disposition: in 24-48hrs    Subjective: Still SOB, on 4L O2   Objective: Weight change: -0.953 kg (-2 lb 1.6 oz)  Intake/Output Summary (Last 24 hours) at 08/26/12 1341 Last data filed at 08/26/12 1027  Gross per 24 hour  Intake    960 ml  Output   1625 ml  Net   -665 ml   Blood pressure 157/87, pulse 110, temperature 97.2 F (36.2 C), temperature source Oral, resp. rate 21, height 5\' 11"  (1.803 m), weight 155.538 kg (342 lb 14.4 oz), SpO2 93.00%.  Physical Exam: General: Alert and awake, oriented x3, not in any acute distress. HEENT: anicteric sclera, pupils reactive to light and accommodation, EOMI CVS: S1-S2 clear, no murmur rubs or gallops Chest: dec BS at bases Abdomen: obese, soft nontender, normal bowel sounds Extremities: no cyanosis, clubbing, 1+ edema noted bilaterally Neuro: Cranial nerves II-XII intact, no focal neurological deficits  Lab Results: Basic Metabolic Panel:  Lab 123XX123 0519 08/25/12 0209  NA 140 138  K 3.4* 3.4*  CL 99 96  CO2 36* 33*    GLUCOSE 65* 101*  BUN 19 18  CREATININE 1.32 1.20  CALCIUM 8.9 9.5  MG -- --  PHOS -- --   Liver Function Tests:  Lab 08/24/12 1050  AST 16  ALT 14  ALKPHOS 30*  BILITOT 0.9  PROT 7.1  ALBUMIN 3.2*   No results found for this basename: LIPASE:2,AMYLASE:2 in the last 168 hours No results found for this basename: AMMONIA:2 in the last 168 hours CBC:  Lab 08/26/12 0519 08/25/12 0209 08/24/12 1050  WBC 8.9 10.3 --  NEUTROABS -- -- 7.9*  HGB 15.7 17.0 --  HCT 47.0 49.3 --  MCV 96.9 96.3 --  PLT 205 233 --   Cardiac Enzymes:  Lab 08/25/12 0209 08/24/12 1952 08/24/12 1513  CKTOTAL -- -- --  CKMB -- -- --  CKMBINDEX -- -- --  TROPONINI <0.30 <0.30 <0.30   BNP: No components found with this basename: POCBNP:2 CBG:  Lab 08/26/12 1147 08/26/12 0800 08/25/12 2042 08/25/12 1658 08/25/12 1141  GLUCAP 128* 106* 175* 94 156*     Micro Results: No results found for this or any previous visit (from the past 240 hour(s)).  Studies/Results: Dg Chest Port 1 View  08/24/2012  *RADIOLOGY REPORT*  Clinical Data: Shortness of breath, diabetes, hypertension, heart failure  PORTABLE CHEST - 1 VIEW  Comparison: 06/28/2012  Findings: Limited exam because of patient body habitus and portable technique.  Heart is enlarged with diffuse pulmonary edema pattern noted and small effusions compatible CHF.  Right costophrenic angle is excluded on the film.  Associated basilar atelectasis  noted. Left subclavian pacer present.  No pneumothorax.  IMPRESSION: Moderate CHF pattern.   Original Report Authenticated By: Jerilynn Mages. Annamaria Boots, M.D.     Medications: Scheduled Meds:   . atorvastatin  20 mg Oral q1800  . carvedilol  6.25 mg Oral BID WC  . cloNIDine  0.2 mg Oral BID  . enoxaparin (LOVENOX) injection  80 mg Subcutaneous Q24H  . furosemide  40 mg Intravenous BID  . insulin aspart  0-15 Units Subcutaneous TID WC  . insulin aspart  0-5 Units Subcutaneous QHS  . insulin aspart  6 Units Subcutaneous  TID WC  . insulin NPH  30 Units Subcutaneous QHS  . insulin NPH  30 Units Subcutaneous QAC breakfast  . lisinopril  10 mg Oral BID AC  . [COMPLETED] potassium chloride  40 mEq Oral Once  . sodium chloride  3 mL Intravenous Q12H  . sodium chloride  3 mL Intravenous Q12H  . spironolactone  100 mg Oral Daily  . [DISCONTINUED] furosemide  40 mg Intravenous Q8H  . [DISCONTINUED] furosemide  40 mg Oral TID  . [DISCONTINUED] insulin aspart  12 Units Subcutaneous BID WC  . [DISCONTINUED] insulin NPH  50 Units Subcutaneous BID AC  . [DISCONTINUED] lisinopril  10 mg Oral Daily      LOS: 2 days   Maja Mccaffery M.D. [facility] 08/26/2012, 1:41 PM Pager: [phone]  If 7PM-7AM, please contact night-coverage [email or web address] Password [email or web address]

## 2012-08-27 DIAGNOSIS — E785 Hyperlipidemia, unspecified: Secondary | ICD-10-CM

## 2012-08-27 LAB — BASIC METABOLIC PANEL
BUN: 20 mg/dL (ref 6–23)
CO2: 34 mEq/L — ABNORMAL HIGH (ref 19–32)
Calcium: 9.2 mg/dL (ref 8.4–10.5)
Creatinine, Ser: 1.25 mg/dL (ref 0.50–1.35)
GFR calc non Af Amer: 61 mL/min — ABNORMAL LOW (ref >90)
Glucose, Bld: 45 mg/dL — ABNORMAL LOW (ref 70–99)
Sodium: 140 mEq/L (ref 135–145)

## 2012-08-27 LAB — CBC
Hemoglobin: 15.8 g/dL (ref 13.0–17.0)
MCH: 32.2 pg (ref 26.0–34.0)
MCHC: 33.5 g/dL (ref 30.0–36.0)
MCV: 95.9 fL (ref 78.0–100.0)

## 2012-08-27 MED ORDER — INSULIN ASPART 100 UNIT/ML ~~LOC~~ SOLN: 6.0000 [IU] | Freq: Three times a day (TID) | SUBCUTANEOUS | Status: DC

## 2012-08-27 MED ORDER — SPIRONOLACTONE 100 MG PO TABS: 100.0000 mg | ORAL_TABLET | Freq: Every day | ORAL | Status: DC

## 2012-08-27 MED ORDER — CLONIDINE HCL 0.1 MG PO TABS: 0.2000 mg | ORAL_TABLET | Freq: Two times a day (BID) | ORAL | Status: DC

## 2012-08-27 MED ORDER — INSULIN NPH (HUMAN) (ISOPHANE) 100 UNIT/ML ~~LOC~~ SUSP: 30.0000 [IU] | Freq: Two times a day (BID) | SUBCUTANEOUS | Status: DC

## 2012-08-27 MED ORDER — LISINOPRIL 10 MG PO TABS: 10.0000 mg | ORAL_TABLET | Freq: Two times a day (BID) | ORAL | Status: DC

## 2012-08-27 MED ORDER — ROSUVASTATIN CALCIUM 10 MG PO TABS: 10.0000 mg | ORAL_TABLET | Freq: Every day | ORAL | Status: DC

## 2012-08-27 MED ORDER — FUROSEMIDE 80 MG PO TABS
80.0000 mg | ORAL_TABLET | Freq: Two times a day (BID) | ORAL | Status: DC
  Filled 2012-08-27 (×2): qty 1

## 2012-08-27 MED ORDER — CARVEDILOL 6.25 MG PO TABS: 6.2500 mg | ORAL_TABLET | Freq: Two times a day (BID) | ORAL | Status: DC

## 2012-08-27 MED ORDER — FUROSEMIDE 80 MG PO TABS: 80.0000 mg | ORAL_TABLET | Freq: Two times a day (BID) | ORAL | Status: DC

## 2012-08-27 NOTE — Progress Notes (Signed)
Subjective:  Doing well  denies chest pain or shortness of breath  Objective:  Vital Signs in the last 24 hours: Temp:  [97.5 F (36.4 C)-98.1 F (36.7 C)] 97.8 F (36.6 C) (11/21 [id number]) Pulse Rate:  [68-76] 76  (11/21 0632) Resp:  [18] 18  (11/21 [id number]) BP: (118-149)/(61-87) 149/87 mmHg (11/21 0632) SpO2:  [94 %-97 %] 96 % (11/21 [id number]) Weight:  [155.8 kg (343 lb 7.6 oz)] 155.8 kg (343 lb 7.6 oz) (11/21 [id number])  Intake/Output from previous day: 11/20 0701 - 11/21 0700 In: 1020 [P.O.:1020] Out: 2195 [Urine:2195] Intake/Output from this shift: Total I/O In: 240 [P.O.:240] Out: 775 [Urine:775]  Physical Exam: Neck: no adenopathy, no carotid bruit, no JVD and supple, symmetrical, trachea midline Lungs: clear to auscultation bilaterally Heart: regular rate and rhythm, S1, S2 normal and Soft systolic murmur noted Abdomen: Soft distended bowel sounds present large ventral hernia noted Extremities: extremities normal, atraumatic, no cyanosis or edema  Lab Results:  Basename 08/27/12 0500 08/26/12 0519  WBC 10.1 8.9  HGB 15.8 15.7  PLT 221 205    Basename 08/27/12 0500 08/26/12 0519  NA 140 140  K 3.3* 3.4*  CL 98 99  CO2 34* 36*  GLUCOSE 45* 65*  BUN 20 19  CREATININE 1.25 1.32    Basename 08/25/12 0209 08/24/12 1952  TROPONINI <0.30 <0.30   Hepatic Function Panel  Basename 08/24/12 1050  PROT 7.1  ALBUMIN 3.2*  AST 16  ALT 14  ALKPHOS 30*  BILITOT 0.9  BILIDIR --  IBILI --   No results found for this basename: CHOL in the last 72 hours No results found for this basename: PROTIME in the last 72 hours  Imaging: Imaging results have been reviewed and No results found.  Cardiac Studies:  Assessment/Plan:  Compensated  systolic heart failure  Nonischemic cardiomyopathy  Status post complete heart block in the past status post permanent pacemaker  Hypertension  Insulin requiring diabetes mellitus  Obstructive sleep apnea/obesity hypoventilation  Morbid  obesity  Hypercholesteremia  Morbid obesity  Ventral hernia Plan Continue present management change IV Lasix to 80 by mouth twice a day okay to discharge from cardiac point of view followup with me in 2 weeks  LOS: 3 days    Kurt Cross 08/27/2012, 10:05 AM

## 2012-08-27 NOTE — Discharge Summary (Signed)
Physician Discharge Summary  Patient ID: Kurt Cross MRN: [id number] DOB/AGE: 1951-09-28 61 y.o.  Admit date: 08/24/2012 Discharge date: 08/27/2012  Primary Care Physician:  [name], MD  Discharge Diagnoses:    . Acute on chronic systolic heart failure . DM . Hypertension . HYPERLIPIDEMIA . OBESITY . OBSTRUCTIVE SLEEP APNEA . Acute-on-chronic respiratory failure  Consults:  Cardiology, Dr. Terrence Dupont   Discharge Medications:   Medication List     As of 08/27/2012 12:26 PM    TAKE these medications         BELVIQ 10 MG Tabs   Generic drug: Lorcaserin HCl   Take 1 tablet by mouth 2 (two) times daily.      carvedilol 6.25 MG tablet   Commonly known as: COREG   Take 1 tablet (6.25 mg total) by mouth 2 (two) times daily with a meal.      cloNIDine 0.1 MG tablet   Commonly known as: CATAPRES   Take 2 tablets (0.2 mg total) by mouth 2 (two) times daily.      furosemide 80 MG tablet   Commonly known as: LASIX   Take 1 tablet (80 mg total) by mouth 2 (two) times daily.      insulin aspart 100 UNIT/ML injection   Commonly known as: novoLOG   Inject 6 Units into the skin 3 (three) times daily with meals.      insulin NPH 100 UNIT/ML injection   Commonly known as: HUMULIN N,NOVOLIN N   Inject 30 Units into the skin 2 (two) times daily before a meal.      lisinopril 10 MG tablet   Commonly known as: PRINIVIL,ZESTRIL   Take 1 tablet (10 mg total) by mouth 2 (two) times daily.      oxyCODONE-acetaminophen 5-325 MG per tablet   Commonly known as: PERCOCET/ROXICET   Take 1-2 tablets by mouth every 4 (four) hours as needed. For  pain      rosuvastatin 10 MG tablet   Commonly known as: CRESTOR   Take 1 tablet (10 mg total) by mouth daily.      spironolactone 100 MG tablet   Commonly known as: ALDACTONE   Take 1 tablet (100 mg total) by mouth daily.         Brief H and P: For complete details please refer to admission H and P, but in brief the patient is a  61 year old male with history of diabetes, chronic respiratory failure on home O2, systolic CHF presented to ED with shortness of breath. Patient reported that his shortness of breath worsened 4 days prior to admission when he ran out of medications. He also endorsed having 2 pillow orthopnea and some productive cough. Chest x-Wente was consistent with CHF and patient was admitted for further workup  Hospital Course:  Patient was admitted for acute on chronic respiratory failure likely scheduled to acute on chronic systolic CHF exacerbation which was most likely precipitated due to noncompliance with diet, medications and followups.  Acute-on-chronic respiratory failure likely secondary to acute on chronic CHF: Significantly improved now. Patient was placed on IV Lasix for diuresis and continued on spironolactone. Cardiology consult was obtained, patient was seen by Dr. Terrence Dupont. Patient is not adherent with diet, medications and follow-ups, which was counseled strongly. Home RN was arranged to followup. Patient was also started on ACE inhibitor and Coreg. He would he had an echocardiogram in September 2013 which showed EF of 35-40%  Diabetes mellitus: Somewhat uncontrolled as hemoglobin A1c is 9.6.  OBESITY: patient counseled on weight control and life style modifications   OBSTRUCTIVE SLEEP APNEA: per patient, has appointment for CPAP/sleep study outpatient 12/15   Hypertension: uncontrolled, added coreg     Day of Discharge BP 149/87  Pulse 76  Temp 97.8 F (36.6 C) (Oral)  Resp 18  Ht 5\' 11"  (1.803 m)  Wt 155.8 kg (343 lb 7.6 oz)  BMI 47.91 kg/m2  SpO2 96%  Physical Exam: General: Alert and awake oriented x3 not in any acute distress. HEENT: anicteric sclera, pupils reactive to light and accommodation CVS: S1-S2 clear no murmur rubs or gallops Chest: clear to auscultation bilaterally, no wheezing rales or rhonchi Abdomen: obese, soft nontender, nondistended, normal bowel sounds, no  organomegaly Extremities: no cyanosis, clubbing. No edema noted bilaterally   The results of significant diagnostics from this hospitalization (including imaging, microbiology, ancillary and laboratory) are listed below for reference.    LAB RESULTS: Basic Metabolic Panel:  Lab [id number] 0500 08/26/12 0519  NA 140 140  K 3.3* 3.4*  CL 98 99  CO2 34* 36*  GLUCOSE 45* 65*  BUN 20 19  CREATININE 1.25 1.32  CALCIUM 9.2 8.9  MG -- --  PHOS -- --   Liver Function Tests:  Lab 08/24/12 1050  AST 16  ALT 14  ALKPHOS 30*  BILITOT 0.9  PROT 7.1  ALBUMIN 3.2*   CBC:  Lab 08/27/12 0500 08/26/12 0519 08/24/12 1050  WBC 10.1 8.9 --  NEUTROABS -- -- 7.9*  HGB 15.8 15.7 --  HCT 47.1 47.0 --  MCV 95.9 -- --  PLT 221 205 --   Cardiac Enzymes:  Lab 08/25/12 0209 08/24/12 1952  CKTOTAL -- --  CKMB -- --  CKMBINDEX -- --  TROPONINI <0.30 <0.30   BNP: No components found with this basename: POCBNP:2 CBG:  Lab 08/27/12 0718 08/26/12 2047  GLUCAP 70 136*    Significant Diagnostic Studies:  Dg Chest Port 1 View  08/24/2012  *RADIOLOGY REPORT*  Clinical Data: Shortness of breath, diabetes, hypertension, heart failure  PORTABLE CHEST - 1 VIEW  Comparison: 06/28/2012  Findings: Limited exam because of patient body habitus and portable technique.  Heart is enlarged with diffuse pulmonary edema pattern noted and small effusions compatible CHF.  Right costophrenic angle is excluded on the film.  Associated basilar atelectasis noted. Left subclavian pacer present.  No pneumothorax.  IMPRESSION: Moderate CHF pattern.   Original Report Authenticated By: Jerilynn Mages. Annamaria Boots, M.D.      Disposition and Follow-up:     Discharge Orders    Future Appointments: Provider: Department: Dept Phone: Center:   09/20/2012 8:00 PM [facility]     Future Orders Please Complete By Expires   Diet - low sodium heart healthy      Diet Carb  Modified      Increase activity slowly      (HEART FAILURE PATIENTS) Call MD:  Anytime you have any of the following symptoms: 1) 3 pound weight gain in 24 hours or 5 pounds in 1 week 2) shortness of breath, with or without a dry hacking cough 3) swelling in the hands, feet or stomach 4) if you have to sleep on extra pillows at night in order to breathe.      Discharge instructions      Comments:   Fluid restriction 1500cc/24hours. Please follow low salt diet and take all your medications as directed.  DISPOSITION: home  DIET: carb modified diet  ACTIVITY: as tolerated   DISCHARGE FOLLOW-UP Follow-up Information    Follow up with [name], MD. Schedule an appointment as soon as possible for a visit in 10 days.   Contact information:   [address] [place]  [address] [phone]       Follow up with [name], MD. Schedule an appointment as soon as possible for a visit in 2 weeks. (for follow-up)    Contact information:   [address] [place] [address] [phone]          Time spent on Discharge: 45 mins  Signed:   RAI,RIPUDEEP M.D. [facility] 08/27/2012, 12:26 PM Pager: [phone]

## 2012-09-20 ENCOUNTER — Ambulatory Visit (HOSPITAL_BASED_OUTPATIENT_CLINIC_OR_DEPARTMENT_OTHER): Payer: Medicare Other

## 2012-10-22 ENCOUNTER — Ambulatory Visit (HOSPITAL_BASED_OUTPATIENT_CLINIC_OR_DEPARTMENT_OTHER): Payer: Medicare Other

## 2013-08-01 ENCOUNTER — Encounter (HOSPITAL_COMMUNITY): Payer: Self-pay | Admitting: Radiology

## 2013-08-01 ENCOUNTER — Emergency Department (HOSPITAL_COMMUNITY): Payer: Medicare Other

## 2013-08-01 ENCOUNTER — Other Ambulatory Visit: Payer: Self-pay

## 2013-08-01 ENCOUNTER — Inpatient Hospital Stay (HOSPITAL_COMMUNITY)
Admission: EM | Admit: 2013-08-01 | Discharge: 2013-08-05 | DRG: 871 | Disposition: A | Discharge status: Home Health | Payer: Medicare Other | Attending: Internal Medicine | Admitting: Internal Medicine

## 2013-08-01 DIAGNOSIS — Z91199 Patient's noncompliance with other medical treatment and regimen due to unspecified reason: Secondary | ICD-10-CM

## 2013-08-01 DIAGNOSIS — A419 Sepsis, unspecified organism: Principal | ICD-10-CM | POA: Diagnosis present

## 2013-08-01 DIAGNOSIS — R509 Fever, unspecified: Secondary | ICD-10-CM

## 2013-08-01 DIAGNOSIS — E785 Hyperlipidemia, unspecified: Secondary | ICD-10-CM | POA: Diagnosis present

## 2013-08-01 DIAGNOSIS — E1122 Type 2 diabetes mellitus with diabetic chronic kidney disease: Secondary | ICD-10-CM | POA: Diagnosis present

## 2013-08-01 DIAGNOSIS — R0602 Shortness of breath: Secondary | ICD-10-CM | POA: Diagnosis present

## 2013-08-01 DIAGNOSIS — E7800 Pure hypercholesterolemia, unspecified: Secondary | ICD-10-CM | POA: Diagnosis present

## 2013-08-01 DIAGNOSIS — E119 Type 2 diabetes mellitus without complications: Secondary | ICD-10-CM | POA: Diagnosis present

## 2013-08-01 DIAGNOSIS — I712 Thoracic aortic aneurysm, without rupture, unspecified: Secondary | ICD-10-CM | POA: Diagnosis present

## 2013-08-01 DIAGNOSIS — J189 Pneumonia, unspecified organism: Secondary | ICD-10-CM | POA: Diagnosis present

## 2013-08-01 DIAGNOSIS — Z9981 Dependence on supplemental oxygen: Secondary | ICD-10-CM

## 2013-08-01 DIAGNOSIS — Z66 Do not resuscitate: Secondary | ICD-10-CM | POA: Diagnosis present

## 2013-08-01 DIAGNOSIS — I10 Essential (primary) hypertension: Secondary | ICD-10-CM

## 2013-08-01 DIAGNOSIS — N183 Chronic kidney disease, stage 3 unspecified: Secondary | ICD-10-CM | POA: Diagnosis present

## 2013-08-01 DIAGNOSIS — K5900 Constipation, unspecified: Secondary | ICD-10-CM | POA: Diagnosis not present

## 2013-08-01 DIAGNOSIS — Z794 Long term (current) use of insulin: Secondary | ICD-10-CM

## 2013-08-01 DIAGNOSIS — Z95810 Presence of automatic (implantable) cardiac defibrillator: Secondary | ICD-10-CM

## 2013-08-01 DIAGNOSIS — K5909 Other constipation: Secondary | ICD-10-CM | POA: Diagnosis present

## 2013-08-01 DIAGNOSIS — R0600 Dyspnea, unspecified: Secondary | ICD-10-CM

## 2013-08-01 DIAGNOSIS — N390 Urinary tract infection, site not specified: Secondary | ICD-10-CM | POA: Diagnosis not present

## 2013-08-01 DIAGNOSIS — Z6841 Body Mass Index (BMI) 40.0 and over, adult: Secondary | ICD-10-CM

## 2013-08-01 DIAGNOSIS — I509 Heart failure, unspecified: Secondary | ICD-10-CM | POA: Diagnosis present

## 2013-08-01 DIAGNOSIS — E669 Obesity, unspecified: Secondary | ICD-10-CM | POA: Diagnosis present

## 2013-08-01 DIAGNOSIS — I129 Hypertensive chronic kidney disease with stage 1 through stage 4 chronic kidney disease, or unspecified chronic kidney disease: Secondary | ICD-10-CM | POA: Diagnosis present

## 2013-08-01 DIAGNOSIS — Z9119 Patient's noncompliance with other medical treatment and regimen: Secondary | ICD-10-CM

## 2013-08-01 DIAGNOSIS — Z87891 Personal history of nicotine dependence: Secondary | ICD-10-CM

## 2013-08-01 DIAGNOSIS — G4733 Obstructive sleep apnea (adult) (pediatric): Secondary | ICD-10-CM

## 2013-08-01 DIAGNOSIS — E876 Hypokalemia: Secondary | ICD-10-CM | POA: Diagnosis not present

## 2013-08-01 DIAGNOSIS — Z79899 Other long term (current) drug therapy: Secondary | ICD-10-CM

## 2013-08-01 DIAGNOSIS — J9620 Acute and chronic respiratory failure, unspecified whether with hypoxia or hypercapnia: Secondary | ICD-10-CM | POA: Diagnosis present

## 2013-08-01 DIAGNOSIS — I5042 Chronic combined systolic (congestive) and diastolic (congestive) heart failure: Secondary | ICD-10-CM | POA: Diagnosis present

## 2013-08-01 DIAGNOSIS — I5023 Acute on chronic systolic (congestive) heart failure: Secondary | ICD-10-CM

## 2013-08-01 DIAGNOSIS — A599 Trichomoniasis, unspecified: Secondary | ICD-10-CM | POA: Diagnosis present

## 2013-08-01 LAB — COMPREHENSIVE METABOLIC PANEL
Alkaline Phosphatase: 33 U/L — ABNORMAL LOW (ref 39–117)
BUN: 23 mg/dL (ref 6–23)
CO2: 29 mEq/L (ref 19–32)
Calcium: 9.6 mg/dL (ref 8.4–10.5)
Creatinine, Ser: 1.5 mg/dL — ABNORMAL HIGH (ref 0.50–1.35)
GFR calc Af Amer: 57 mL/min — ABNORMAL LOW (ref >90)
Glucose, Bld: 254 mg/dL — ABNORMAL HIGH (ref 70–99)
Potassium: 3.8 mEq/L (ref 3.5–5.1)
Total Bilirubin: 1.9 mg/dL — ABNORMAL HIGH (ref 0.3–1.2)
Total Protein: 7.6 g/dL (ref 6.0–8.3)

## 2013-08-01 LAB — CBC WITH DIFFERENTIAL/PLATELET
Eosinophils Absolute: 0 10*3/uL (ref 0.0–0.7)
Eosinophils Relative: 0 % (ref 0–5)
HCT: 42.8 % (ref 39.0–52.0)
Hemoglobin: 15.1 g/dL (ref 13.0–17.0)
Lymphocytes Relative: 9 % — ABNORMAL LOW (ref 12–46)
Lymphs Abs: 1.8 10*3/uL (ref 0.7–4.0)
MCH: 33.9 pg (ref 26.0–34.0)
MCHC: 35.3 g/dL (ref 30.0–36.0)
MCV: 96.2 fL (ref 78.0–100.0)
Monocytes Absolute: 1.6 10*3/uL — ABNORMAL HIGH (ref 0.1–1.0)
Monocytes Relative: 8 % (ref 3–12)
RBC: 4.45 MIL/uL (ref 4.22–5.81)

## 2013-08-01 LAB — GLUCOSE, CAPILLARY: Glucose-Capillary: 258 mg/dL — ABNORMAL HIGH (ref 70–99)

## 2013-08-01 LAB — TROPONIN I: Troponin I: <0.3 ng/mL (ref <0.30)

## 2013-08-01 MED ORDER — FUROSEMIDE 10 MG/ML IJ SOLN
20.0000 mg | Freq: Once | INTRAMUSCULAR | Status: AC
  Administered 2013-08-02: 01:00:00 20 mg via INTRAVENOUS
  Filled 2013-08-01: qty 2

## 2013-08-01 MED ORDER — SODIUM CHLORIDE 0.9 % IJ SOLN
3.0000 mL | Freq: Two times a day (BID) | INTRAMUSCULAR | Status: DC
  Administered 2013-08-04 – 2013-08-05 (×2): 3 mL via INTRAVENOUS

## 2013-08-01 MED ORDER — IOHEXOL 350 MG/ML SOLN
100.0000 mL | Freq: Once | INTRAVENOUS | Status: AC | PRN
  Administered 2013-08-01: 100 mL via INTRAVENOUS

## 2013-08-01 MED ORDER — DEXTROSE 5 % IV SOLN
1.0000 g | INTRAVENOUS | Status: DC
  Administered 2013-08-01 – 2013-08-04 (×4): 1 g via INTRAVENOUS
  Filled 2013-08-01 (×5): qty 10

## 2013-08-01 MED ORDER — INSULIN ASPART 100 UNIT/ML ~~LOC~~ SOLN: 0.0000 [IU] | Freq: Three times a day (TID) | SUBCUTANEOUS | Status: DC

## 2013-08-01 MED ORDER — INSULIN GLARGINE 100 UNIT/ML ~~LOC~~ SOLN
40.0000 [IU] | Freq: Every day | SUBCUTANEOUS | Status: DC
  Administered 2013-08-02 (×2): 40 [IU] via SUBCUTANEOUS
  Filled 2013-08-01 (×3): qty 0.4

## 2013-08-01 MED ORDER — FUROSEMIDE 80 MG PO TABS
80.0000 mg | ORAL_TABLET | Freq: Two times a day (BID) | ORAL | Status: DC
  Administered 2013-08-02 – 2013-08-05 (×6): 80 mg via ORAL
  Filled 2013-08-01 (×10): qty 1

## 2013-08-01 MED ORDER — ATORVASTATIN CALCIUM 40 MG PO TABS
40.0000 mg | ORAL_TABLET | Freq: Every day | ORAL | Status: DC
  Administered 2013-08-02 – 2013-08-04 (×3): 40 mg via ORAL
  Filled 2013-08-01 (×4): qty 1

## 2013-08-01 MED ORDER — ONDANSETRON HCL 4 MG/2ML IJ SOLN: 4.0000 mg | Freq: Four times a day (QID) | INTRAMUSCULAR | Status: DC | PRN

## 2013-08-01 MED ORDER — INFLUENZA VAC SPLIT QUAD 0.5 ML IM SUSP
0.5000 mL | INTRAMUSCULAR | Status: AC
  Administered 2013-08-02: 11:00:00 0.5 mL via INTRAMUSCULAR
  Filled 2013-08-01 (×2): qty 0.5

## 2013-08-01 MED ORDER — ENOXAPARIN SODIUM 80 MG/0.8ML ~~LOC~~ SOLN
80.0000 mg | Freq: Every day | SUBCUTANEOUS | Status: DC
  Administered 2013-08-02 – 2013-08-04 (×4): 80 mg via SUBCUTANEOUS
  Filled 2013-08-01 (×5): qty 0.8

## 2013-08-01 MED ORDER — DEXTROSE 5 % IV SOLN: 500.0000 mg | INTRAVENOUS | Status: DC

## 2013-08-01 MED ORDER — ACETAMINOPHEN 650 MG RE SUPP: 650.0000 mg | Freq: Four times a day (QID) | RECTAL | Status: DC | PRN

## 2013-08-01 MED ORDER — SPIRONOLACTONE 100 MG PO TABS
100.0000 mg | ORAL_TABLET | Freq: Every day | ORAL | Status: DC
Start: 2013-08-02 — End: 2013-08-05
  Administered 2013-08-02 – 2013-08-05 (×4): 100 mg via ORAL
  Filled 2013-08-01 (×4): qty 1

## 2013-08-01 MED ORDER — DEXTROSE 5 % IV SOLN
500.0000 mg | INTRAVENOUS | Status: DC
  Administered 2013-08-01 – 2013-08-05 (×4): 500 mg via INTRAVENOUS
  Filled 2013-08-01 (×4): qty 500

## 2013-08-01 MED ORDER — LISINOPRIL 10 MG PO TABS
10.0000 mg | ORAL_TABLET | Freq: Two times a day (BID) | ORAL | Status: DC
  Administered 2013-08-02 – 2013-08-05 (×8): 10 mg via ORAL
  Filled 2013-08-01 (×10): qty 1

## 2013-08-01 MED ORDER — OXYCODONE-ACETAMINOPHEN 5-325 MG PO TABS
1.0000 | ORAL_TABLET | ORAL | Status: DC | PRN
  Administered 2013-08-02 – 2013-08-05 (×12): 2 via ORAL
  Filled 2013-08-01 (×12): qty 2

## 2013-08-01 MED ORDER — ACETAMINOPHEN 325 MG PO TABS
650.0000 mg | ORAL_TABLET | Freq: Four times a day (QID) | ORAL | Status: DC | PRN
  Administered 2013-08-01: 650 mg via ORAL
  Filled 2013-08-01: qty 2

## 2013-08-01 MED ORDER — SODIUM CHLORIDE 0.9 % IJ SOLN
3.0000 mL | Freq: Two times a day (BID) | INTRAMUSCULAR | Status: DC
  Administered 2013-08-04: 22:00:00 3 mL via INTRAVENOUS

## 2013-08-01 MED ORDER — DEXTROSE 5 % IV SOLN: 1.0000 g | INTRAVENOUS | Status: DC

## 2013-08-01 MED ORDER — ONDANSETRON HCL 4 MG PO TABS: 4.0000 mg | ORAL_TABLET | Freq: Four times a day (QID) | ORAL | Status: DC | PRN

## 2013-08-01 MED ORDER — SODIUM CHLORIDE 0.9 % IV SOLN
INTRAVENOUS | Status: DC
  Administered 2013-08-01: 18:00:00 via INTRAVENOUS

## 2013-08-01 MED ORDER — BIOTENE DRY MOUTH MT LIQD
15.0000 mL | Freq: Two times a day (BID) | OROMUCOSAL | Status: DC
  Administered 2013-08-02 – 2013-08-05 (×7): 15 mL via OROMUCOSAL

## 2013-08-01 NOTE — Progress Notes (Signed)
PHARMACY - LOVENOX  Lovenox 40mg  sq q24h ordered for VTE prophylaxis and pharmacy asked to adjust dose as needed.  62 yr male with total body weight = 156kg, CrCl ~ 80 ml/min and BMI = 47  As BMI > 30, will adjust Lovenox dose to 0.5 mg/kg/q24h  PLAN:  Change Lovenox to 80mg  sq q24h              Follow weight and readjust as needed.  Leone Haven, PharmD

## 2013-08-01 NOTE — ED Notes (Signed)
Patient transported to CT 

## 2013-08-01 NOTE — ED Provider Notes (Signed)
CSN: [id number]     Arrival date & time 08/01/13  1628 History   First MD Initiated Contact with Patient 08/01/13 1630     Chief Complaint  Patient presents with  . Shortness of Breath   (Consider location/radiation/quality/duration/timing/severity/associated sxs/prior Treatment) Patient is a 62 y.o. male presenting with shortness of breath. The history is provided by the patient and the spouse.  Shortness of Breath  patient here with shortness of breath and cough x4 days which is been progressively worse. Symptoms worse with exertion. Cough has been dry and not associated with fever. Some orthopnea. No anginal type chest pain. No treatment used for this prior to arrival. History of similar symptoms associated with pneumonia as well as with congestive heart failure. No increased pedal edema. No syncope or near-syncope. No vomiting or diarrhea. No treatment used prior to arrival   Past Medical History  Diagnosis Date  . Sleep apnea   . Diabetes mellitus   . Hypertension   . Hyperlipidemia   . CHF (congestive heart failure)   . Hernia    Past Surgical History  Procedure Laterality Date  . Appendectomy    . Tonsillectomy    . Hernia surgeries      multiple surgeries  . Nasal septum surgery     No family history on file. History  Substance Use Topics  . Smoking status: Former Smoker    Quit date: 08/21/2012  . Smokeless tobacco: Never Used  . Alcohol Use: No    Review of Systems  Respiratory: Positive for shortness of breath.   All other systems reviewed and are negative.    Allergies  Review of patient's allergies indicates no known allergies.  Home Medications   Current Outpatient Rx  Name  Route  Sig  Dispense  Refill  . carvedilol (COREG) 6.25 MG tablet   Oral   Take 1 tablet (6.25 mg total) by mouth 2 (two) times daily with a meal.   60 tablet   3   . cloNIDine (CATAPRES) 0.1 MG tablet   Oral   Take 2 tablets (0.2 mg total) by mouth 2 (two) times daily.  60 tablet   3   . furosemide (LASIX) 80 MG tablet   Oral   Take 1 tablet (80 mg total) by mouth 2 (two) times daily.   60 tablet   3   . insulin aspart (NOVOLOG) 100 UNIT/ML injection   Subcutaneous   Inject 6 Units into the skin 3 (three) times daily with meals.   1 vial   3   . insulin NPH (HUMULIN N,NOVOLIN N) 100 UNIT/ML injection   Subcutaneous   Inject 30 Units into the skin 2 (two) times daily before a meal.   1 vial   3   . lisinopril (PRINIVIL,ZESTRIL) 10 MG tablet   Oral   Take 1 tablet (10 mg total) by mouth 2 (two) times daily.   60 tablet   3   . Lorcaserin HCl (BELVIQ) 10 MG TABS   Oral   Take 1 tablet by mouth 2 (two) times daily.         [name] Kitchen oxyCODONE-acetaminophen (PERCOCET/ROXICET) 5-325 MG per tablet   Oral   Take 1-2 tablets by mouth every 4 (four) hours as needed. For  pain         . rosuvastatin (CRESTOR) 10 MG tablet   Oral   Take 1 tablet (10 mg total) by mouth daily.   30 tablet   3   .  spironolactone (ALDACTONE) 100 MG tablet   Oral   Take 1 tablet (100 mg total) by mouth daily.   30 tablet   3    BP 109/60  Pulse 85  Temp(Src) 98.6 F (37 C) (Oral)  SpO2 91% Physical Exam  Nursing note and vitals reviewed. Constitutional: He is oriented to person, place, and time. He appears well-developed and well-nourished.  Non-toxic appearance. No distress.  HENT:  Head: Normocephalic and atraumatic.  Eyes: Conjunctivae, EOM and lids are normal. Pupils are equal, round, and reactive to light.  Neck: Normal range of motion. Neck supple. No tracheal deviation present. No mass present.  Cardiovascular: Normal rate, regular rhythm and normal heart sounds.  Exam reveals no gallop.   No murmur heard. Pulmonary/Chest: Effort normal. No stridor. No respiratory distress. He has decreased breath sounds. He has wheezes. He has no rhonchi. He has no rales.  Abdominal: Soft. Normal appearance and bowel sounds are normal. He exhibits no distension.  There is no tenderness. There is no rigidity, no rebound, no guarding and no CVA tenderness.  Abdominal swelling noted which according to the patient is his baseline  Musculoskeletal: Normal range of motion. He exhibits no edema and no tenderness.  2+ bilateral lower extremity edema  Neurological: He is alert and oriented to person, place, and time. He has normal strength. No cranial nerve deficit or sensory deficit. GCS eye subscore is 4. GCS verbal subscore is 5. GCS motor subscore is 6.  Skin: Skin is warm and dry. No abrasion and no rash noted.  Psychiatric: He has a normal mood and affect. His speech is normal and behavior is normal.    ED Course  Procedures (including critical care time) Labs Review Labs Reviewed - No data to display Imaging Review No results found.  EKG Interpretation     Ventricular Rate:  86 PR Interval:  69 QRS Duration: 134 QT Interval:  410 QTC Calculation: 490 R Axis:   -71 Text Interpretation:  Sinus rhythm Short PR interval Left bundle branch block, new when compared to prior            MDM  No diagnosis found. Patient started on antibiotics for suspected early pneumonia. Will be admitted by hospitalist    Leota Jacobsen, MD 08/01/13 2040

## 2013-08-01 NOTE — ED Notes (Signed)
Pt states that he has had SOB w/o CP x 4 days or more. Dry cough.

## 2013-08-01 NOTE — H&P (Signed)
[facility] History and Physical  [name] [id number] DOB: [birth year] DOA: 08/01/2013  Referring physician: ER physician. PCP: [name], MD  Specialists: Dr. Terrence Dupont.  Chief Complaint: Shortness of breath.  HPI: Kurt Cross is a 62 y.o. male with known history of chronic systolic heart failure status post defibrillator placement last EF measured was in 2013 was 35-40%, on home oxygen, OSA noncompliant with CPAP, diabetes mellitus, hypertension and hyperlipidemia presented to the ER because of shortness of breath and fever and chills. Rate has been having these symptoms for last 4 days which has been progressively worsening. Patient has short of breath at rest increased on lying down in exertion. Has been having some nonproductive cough. Denies any chest pain. Denies any nausea vomiting abdominal pain or diarrhea. In the ER patient had a CT angiogram of the chest which is negative for anything acute but does show aortic aneurysm. Patient has been admitted for presumptive diagnosis of pneumonia.   Review of Systems: As presented in the history of presenting illness, rest negative.  Past Medical History  Diagnosis Date  . Sleep apnea   . Diabetes mellitus   . Hypertension   . Hyperlipidemia   . CHF (congestive heart failure)   . Hernia    Past Surgical History  Procedure Laterality Date  . Appendectomy    . Tonsillectomy    . Hernia surgeries      multiple surgeries  . Nasal septum surgery     Social History:  reports that he quit smoking about a year ago. He has never used smokeless tobacco. He reports that he does not drink alcohol or use illicit drugs. Where does patient live home. Can patient participate in ADLs? Yes.  No Known Allergies  Family History: History reviewed. No pertinent family history.    Prior to Admission medications   Medication Sig Start Date End Date Taking? Authorizing Provider  carvedilol (COREG) 6.25 MG tablet Take 1 tablet (6.25 mg  total) by mouth 2 (two) times daily with a meal. 08/27/12  Yes Ripudeep K Rai, MD  furosemide (LASIX) 80 MG tablet Take 1 tablet (80 mg total) by mouth 2 (two) times daily. 08/27/12  Yes Ripudeep Krystal Eaton, MD  insulin aspart (NOVOLOG) 100 UNIT/ML injection Inject 6 Units into the skin 3 (three) times daily with meals. 08/27/12  Yes Ripudeep Krystal Eaton, MD  insulin aspart (NOVOLOG) 100 UNIT/ML injection Inject 12 Units into the skin 3 (three) times daily with meals.   Yes Historical Provider, MD  insulin glargine (LANTUS) 100 UNIT/ML injection Inject 40 Units into the skin at bedtime.   Yes Historical Provider, MD  lisinopril (PRINIVIL,ZESTRIL) 10 MG tablet Take 1 tablet (10 mg total) by mouth 2 (two) times daily. 08/27/12  Yes Ripudeep Krystal Eaton, MD  oxyCODONE-acetaminophen (PERCOCET/ROXICET) 5-325 MG per tablet Take 1-2 tablets by mouth every 4 (four) hours as needed. For  pain 06/29/12  Yes Robbie Lis, MD  rosuvastatin (CRESTOR) 20 MG tablet Take 20 mg by mouth every other day.   Yes Historical Provider, MD  spironolactone (ALDACTONE) 100 MG tablet Take 1 tablet (100 mg total) by mouth daily. 08/27/12  Yes Ripudeep Krystal Eaton, MD    Physical Exam: Filed Vitals:   08/01/13 1639 08/01/13 1932 08/01/13 2127  BP: 109/60 166/98 110/61  Pulse: 85 82 89  Temp: 98.6 F (37 C) 101.3 F (38.5 C) 99.2 F (37.3 C)  TempSrc: Oral Oral Oral  Resp:  20 22  SpO2: 91%  94% 95%     General:  Well-developed and nourished.  Eyes: Anicteric no pallor.  ENT: No discharge from ears eyes nose mouth.  Neck: No mass felt.  Cardiovascular: S1-S2 heard.  Respiratory: No rhonchi or crepitations.  Abdomen: Soft nontender bowel sounds present. Has abdominal hernia.  Skin: No rash.  Musculoskeletal: No edema.  Psychiatric: Appears normal.  Neurologic: Alert and oriented to time place and person. Moves all extremities.  Labs on Admission:  Basic Metabolic Panel:  Recent Labs Lab 08/01/13 1649  NA 132*  K 3.8   CL 93*  CO2 29  GLUCOSE 254*  BUN 23  CREATININE 1.50*  CALCIUM 9.6   Liver Function Tests:  Recent Labs Lab 08/01/13 1649  AST 36  ALT 25  ALKPHOS 33*  BILITOT 1.9*  PROT 7.6  ALBUMIN 3.0*   No results found for this basename: LIPASE, AMYLASE,  in the last 168 hours No results found for this basename: AMMONIA,  in the last 168 hours CBC:  Recent Labs Lab 08/01/13 1649  WBC 20.1*  NEUTROABS 16.6*  HGB 15.1  HCT 42.8  MCV 96.2  PLT 171   Cardiac Enzymes:  Recent Labs Lab 08/01/13 1649  TROPONINI <0.30    BNP (last 3 results)  Recent Labs  08/24/12 1050 08/27/12 0500 08/01/13 1649  PROBNP 1036.0* 253.4* 481.4*   CBG: No results found for this basename: GLUCAP,  in the last 168 hours  Radiological Exams on Admission: Dg Chest 2 View  08/01/2013   CLINICAL DATA:  Chest pain, shortness of breath.  EXAM: CHEST  2 VIEW  COMPARISON:  August 24, 2012.  FINDINGS: Stable cardiomegaly. Left-sided pacemaker is noted. No pleural effusion or pneumothorax is noted. Mild central pulmonary vascular congestion is noted which appears to be chronic. No acute pulmonary disease is noted.  IMPRESSION: No active cardiopulmonary disease.   Electronically Signed   By: Sabino Dick M.D.   On: 08/01/2013 17:06   Ct Angio Chest Pe W/cm &/or Wo Cm  08/01/2013   CLINICAL DATA:  Shortness of Breath,  EXAM: CT ANGIOGRAPHY CHEST WITH CONTRAST  TECHNIQUE: Multidetector CT imaging of the chest was performed using the standard protocol during bolus administration of intravenous contrast. Multiplanar CT image reconstructions including MIPs were obtained to evaluate the vascular anatomy.  CONTRAST:  120mL OMNIPAQUE IOHEXOL 350 MG/ML SOLN  COMPARISON:  06/28/2012  FINDINGS: There is fairly good contrast opacification of the pulmonary artery branches with no convincing filling defects to suggest acute PE. There is good contrast opacification of the thoracic aorta with some scattered plaque, no  evidence of dissection or stenosis. There is dilatation of the ascending aorta measuring up to 4.5 cm transverse diameter, stable by my measurement since prior study. Bovine brachiocephalic arterial origin anatomy without proximal stenosis. No pleural or pericardial effusion. No hilar or mediastinal adenopathy. Transvenous pacing leads are noted. Minimal linear scarring or subsegmental atelectasis in both lower lobes. Lungs otherwise clear. Visualized portions of upper abdomen unremarkable. Multiple bridging osteophytes throughout the mid and lower thoracic spine. Sternum intact.  Review of the MIP images confirms the above findings.  IMPRESSION: 1. 4.5 cm ascending aortic aneurysm without complicating features. 2. No evidence of acute pulmonary embolism or aortic dissection.   Electronically Signed   By: Arne Cleveland M.D.   On: 08/01/2013 19:55    EKG: Independently reviewed. Sinus rhythm with LBBB.  Assessment/Plan Active Problems:   DM   HYPERLIPIDEMIA   OBSTRUCTIVE SLEEP APNEA  CONGESTIVE HEART FAILURE   Shortness of breath   Fever   1. Shortness of breath most likely secondary pneumonia with a component of CHF - patient has been placed on ceftriaxone and Zithromax for possible community-acquired pneumonia. Check influenza panel. I have written for one dose of Lasix IV and continue patient's home dose of Lasix and spironolactone. 2. 4.5 cm ascending headache aneurysm - will need followup with thoracic surgeon. Patient denies any pain chest pain at this time. 3. Chronic systolic heart failure status post defibrillator placement - see #1. 4. Chronic kidney disease - closely follow intake and output and metabolic panel. 5. Diabetes mellitus type 2 - closely follow CBGs. 6. History of OSA noncompliant with CPAP. 7. Hyperlipidemia.    Code Status: Full code.  Family Communication: None.  Disposition Plan: Admit to inpatient.    KAKRAKANDY,ARSHAD N. [facility] Pager  [phone].  If 7PM-7AM, please contact night-coverage [email or web address] Password [email or web address] 08/01/2013, 10:06 PM

## 2013-08-02 DIAGNOSIS — A419 Sepsis, unspecified organism: Secondary | ICD-10-CM | POA: Diagnosis present

## 2013-08-02 HISTORY — DX: Sepsis, unspecified organism: A41.9

## 2013-08-02 LAB — URINALYSIS, ROUTINE W REFLEX MICROSCOPIC
Ketones, ur: NEGATIVE mg/dL
Nitrite: NEGATIVE
Protein, ur: 30 mg/dL — AB
pH: 5 (ref 5.0–8.0)

## 2013-08-02 LAB — URINE MICROSCOPIC-ADD ON

## 2013-08-02 LAB — BASIC METABOLIC PANEL
Calcium: 9.1 mg/dL (ref 8.4–10.5)
Chloride: 94 mEq/L — ABNORMAL LOW (ref 96–112)
Creatinine, Ser: 1.42 mg/dL — ABNORMAL HIGH (ref 0.50–1.35)
GFR calc Af Amer: 61 mL/min — ABNORMAL LOW (ref >90)
Sodium: 132 mEq/L — ABNORMAL LOW (ref 135–145)

## 2013-08-02 LAB — STREP PNEUMONIAE URINARY ANTIGEN: Strep Pneumo Urinary Antigen: NEGATIVE

## 2013-08-02 LAB — GLUCOSE, CAPILLARY
Glucose-Capillary: 275 mg/dL — ABNORMAL HIGH (ref 70–99)
Glucose-Capillary: 304 mg/dL — ABNORMAL HIGH (ref 70–99)

## 2013-08-02 LAB — CBC
Hemoglobin: 13.3 g/dL (ref 13.0–17.0)
MCHC: 34.5 g/dL (ref 30.0–36.0)
Platelets: 168 10*3/uL (ref 150–400)
RDW: 14.3 % (ref 11.5–15.5)
WBC: 17.1 10*3/uL — ABNORMAL HIGH (ref 4.0–10.5)

## 2013-08-02 LAB — INFLUENZA PANEL BY PCR (TYPE A & B)
H1N1 flu by pcr: NOT DETECTED
Influenza A By PCR: NEGATIVE

## 2013-08-02 LAB — TROPONIN I: Troponin I: <0.3 ng/mL (ref <0.30)

## 2013-08-02 LAB — LEGIONELLA ANTIGEN, URINE

## 2013-08-02 LAB — HEMOGLOBIN A1C: Mean Plasma Glucose: 226 mg/dL — ABNORMAL HIGH (ref <117)

## 2013-08-02 MED ORDER — POTASSIUM CHLORIDE CRYS ER 20 MEQ PO TBCR
20.0000 meq | EXTENDED_RELEASE_TABLET | Freq: Two times a day (BID) | ORAL | Status: DC
  Administered 2013-08-02 – 2013-08-05 (×7): 20 meq via ORAL
  Filled 2013-08-02 (×8): qty 1

## 2013-08-02 MED ORDER — INSULIN ASPART 100 UNIT/ML ~~LOC~~ SOLN
0.0000 [IU] | Freq: Three times a day (TID) | SUBCUTANEOUS | Status: DC
  Administered 2013-08-02: 15 [IU] via SUBCUTANEOUS
  Administered 2013-08-02: 09:00:00 11 [IU] via SUBCUTANEOUS
  Administered 2013-08-02: 4 [IU] via SUBCUTANEOUS
  Administered 2013-08-03: 08:00:00 7 [IU] via SUBCUTANEOUS

## 2013-08-02 MED ORDER — METRONIDAZOLE 500 MG PO TABS
2000.0000 mg | ORAL_TABLET | Freq: Once | ORAL | Status: AC
  Administered 2013-08-02: 2000 mg via ORAL
  Filled 2013-08-02: qty 4

## 2013-08-02 MED ORDER — CARVEDILOL 6.25 MG PO TABS
6.2500 mg | ORAL_TABLET | Freq: Two times a day (BID) | ORAL | Status: DC
  Administered 2013-08-03 – 2013-08-05 (×5): 6.25 mg via ORAL
  Filled 2013-08-02 (×9): qty 1

## 2013-08-02 MED ORDER — INSULIN ASPART 100 UNIT/ML ~~LOC~~ SOLN
0.0000 [IU] | Freq: Every day | SUBCUTANEOUS | Status: DC
  Administered 2013-08-02: 22:00:00 2 [IU] via SUBCUTANEOUS

## 2013-08-02 NOTE — Progress Notes (Signed)
[facility] PROGRESS NOTE  Kurt Cross [id number] DOB: [birth year] DOA: 08/01/2013 PCP: [name], MD  Brief narrative: Kurt Cross is an 62 y.o. male with a PMH of known chronic systolic CHF status post defibrillator placement, last EF measured in 2013 was 35-40%, chronic respiratory failure on home oxygen, OS A. noncompliant with CPAP, diabetes, hypertension and hyperlipidemia who was admitted on 08/02/2013 with presumed pneumonia.  Assessment/Plan: Principal Problem:   Sepsis with Acute-on-chronic respiratory failure / shortness of breath / fever / secondary to CAP versus UTI -Patient meets criteria for sepsis given leukocytosis, fever, a documented respiratory rate of 22 on admission and suspected source of infection. -Continue empiric ceftriaxone/Zithromax which will cover both respiratory and urinary pathogens. Trichomonas noted on urinalysis. -Followup influenza screen. Strep pneumonia negative. Legionella antigen pending. -Continue supplemental oxygen. -Blood cultures not collected on admission. Will order for temperature greater than 101. Active Problems:   Trichomoniasis -Will give 2 grams of Flagyl x 1. Send urine culture.   Hypokalemia -Replete.   Hypertension -Continue Lasix, spironolactone and lisinopril. Resume Coreg.   Ascending aortic aneurysm -4.5 cm on CT scan. Will need followup with thoracic surgery. Control blood pressure.   DM -Continue Lantus 40 units each bedtime. -Sugars uncontrolled. Change SS I to resistant scale. -Check hemoglobin A1c.   HYPERLIPIDEMIA -Continue Crestor.   OBSTRUCTIVE SLEEP APNEA -History of noncompliance with CPAP.   Chronic systolic CONGESTIVE HEART FAILURE -Status post defibrillator. -Continue home dose of Lasix. -Monitor I and O. balance/daily weight. -Troponins negative. ProBNP 481.   Stage III chronic kidney disease -Baseline creatinine 1.2-1.3.  Code Status: DNR. Family Communication: No family at  bedside. Disposition Plan: Home when stable.   Medical Consultants:  None.  Other Consultants:  None.  Anti-infectives:  Rocephin 08/01/2013--->  Azithromycin 08/01/2013--->   HPI/Subjective: Kurt Cross is lethargic.  Opens eyes to voice, but has difficulty staying awake during my assessment.  Complains of generalized pain in his back, legs, buttocks.  Reports he is breathing better.  No cough.  Objective: Filed Vitals:   08/01/13 2211 08/01/13 2213 08/01/13 2232 08/02/13 0502  BP:  110/60 104/66 103/61  Pulse: 88  79 67  Temp: 102.6 F (39.2 C)  98.4 F (36.9 C) 97.9 F (36.6 C)  TempSrc: Oral  Oral Oral  Resp:  18 18 19   Height:   5\' 11"  (1.803 m)   Weight:   156.037 kg (344 lb) 156.8 kg (345 lb 10.9 oz)  SpO2: 98%  95% 100%    Intake/Output Summary (Last 24 hours) at 08/02/13 0749 Last data filed at 08/02/13 0600  Gross per 24 hour  Intake   1530 ml  Output   1000 ml  Net    530 ml    Exam: Gen:  Lethargic. Cardiovascular:  RRR, No M/R/G Respiratory:  Lungs diminished Gastrointestinal:  Abdomen soft, NT/ND, + BS Extremities:  + edema  Data Reviewed: Basic Metabolic Panel:  Recent Labs Lab 08/01/13 1649 08/02/13 0505  NA 132* 132*  K 3.8 3.1*  CL 93* 94*  CO2 29 27  GLUCOSE 254* 334*  BUN 23 23  CREATININE 1.50* 1.42*  CALCIUM 9.6 9.1   GFR Estimated Creatinine Clearance: 84.4 ml/min (by C-G formula based on Cr of 1.42). Liver Function Tests:  Recent Labs Lab 08/01/13 1649  AST 36  ALT 25  ALKPHOS 33*  BILITOT 1.9*  PROT 7.6  ALBUMIN 3.0*   CBC:  Recent Labs Lab 08/01/13 1649 08/02/13 0505  WBC  20.1* 17.1*  NEUTROABS 16.6*  --   HGB 15.1 13.3  HCT 42.8 38.6*  MCV 96.2 96.5  PLT 171 168   Cardiac Enzymes:  Recent Labs Lab 08/01/13 1649 08/01/13 2315  TROPONINI <0.30 <0.30   BNP (last 3 results)  Recent Labs  08/24/12 1050 08/27/12 0500 08/01/13 1649  PROBNP 1036.0* 253.4* 481.4*   CBG:  Recent  Labs Lab 08/01/13 2238  GLUCAP 258*   Hgb A1c No results found for this basename: HGBA1C,  in the last 72 hours Microbiology No results found for this or any previous visit (from the past 240 hour(s)).   Procedures and Diagnostic Studies: Dg Chest 2 View  08/01/2013   CLINICAL DATA:  Chest pain, shortness of breath.  EXAM: CHEST  2 VIEW  COMPARISON:  August 24, 2012.  FINDINGS: Stable cardiomegaly. Left-sided pacemaker is noted. No pleural effusion or pneumothorax is noted. Mild central pulmonary vascular congestion is noted which appears to be chronic. No acute pulmonary disease is noted.  IMPRESSION: No active cardiopulmonary disease.   Electronically Signed   By: Sabino Dick M.D.   On: 08/01/2013 17:06   Ct Angio Chest Pe W/cm &/or Wo Cm  08/01/2013   CLINICAL DATA:  Shortness of Breath,  EXAM: CT ANGIOGRAPHY CHEST WITH CONTRAST  TECHNIQUE: Multidetector CT imaging of the chest was performed using the standard protocol during bolus administration of intravenous contrast. Multiplanar CT image reconstructions including MIPs were obtained to evaluate the vascular anatomy.  CONTRAST:  163mL OMNIPAQUE IOHEXOL 350 MG/ML SOLN  COMPARISON:  06/28/2012  FINDINGS: There is fairly good contrast opacification of the pulmonary artery branches with no convincing filling defects to suggest acute PE. There is good contrast opacification of the thoracic aorta with some scattered plaque, no evidence of dissection or stenosis. There is dilatation of the ascending aorta measuring up to 4.5 cm transverse diameter, stable by my measurement since prior study. Bovine brachiocephalic arterial origin anatomy without proximal stenosis. No pleural or pericardial effusion. No hilar or mediastinal adenopathy. Transvenous pacing leads are noted. Minimal linear scarring or subsegmental atelectasis in both lower lobes. Lungs otherwise clear. Visualized portions of upper abdomen unremarkable. Multiple bridging osteophytes  throughout the mid and lower thoracic spine. Sternum intact.  Review of the MIP images confirms the above findings.  IMPRESSION: 1. 4.5 cm ascending aortic aneurysm without complicating features. 2. No evidence of acute pulmonary embolism or aortic dissection.   Electronically Signed   By: Arne Cleveland M.D.   On: 08/01/2013 19:55    Scheduled Meds: . antiseptic oral rinse  15 mL Mouth Rinse BID  . atorvastatin  40 mg Oral q1800  . azithromycin  500 mg Intravenous Q24H  . cefTRIAXone (ROCEPHIN)  IV  1 g Intravenous Q24H  . enoxaparin (LOVENOX) injection  80 mg Subcutaneous QHS  . furosemide  80 mg Oral BID  . influenza vac split quadrivalent PF  0.5 mL Intramuscular Tomorrow-1000  . insulin aspart  0-9 Units Subcutaneous TID WC  . insulin glargine  40 Units Subcutaneous QHS  . lisinopril  10 mg Oral BID  . sodium chloride  3 mL Intravenous Q12H  . sodium chloride  3 mL Intravenous Q12H  . spironolactone  100 mg Oral Daily   Continuous Infusions:   Time spent: 35 minutes with > 50% of time discussing current diagnostic test results, clinical impression and plan of care.    LOS: 1 day   Kurt Cross  [facility] Pager [phone].   *Please  note that the hospitalists switch teams on Wednesdays. Please call the flow manager at [phone] if you are having difficulty reaching the hospitalist taking care of this patient as she can update you and provide the most up-to-date pager number of provider caring for the patient. If 8PM-8AM, please contact night-coverage at [email or web address], password [email or web address]  08/02/2013, 7:49 AM    Information printed out, explained, and given to the patient:  In an effort to keep you and your family informed about your hospital stay, I am providing you with this information sheet. If you or your family have any questions, please do not hesitate to have the nursing staff page me to set up a meeting time.  Kurt Cross 08/02/2013 1 (Number of days in the  hospital)  Treatment team:  Dr. Margreta Journey Kurt Cross, Hospitalist (Internist)  Active Treatment Issues with Plan: Principal Problem:   Infection with shortness of breath / fever / possible urinary tract infection versus bronchitis -Currently being treated with antibiotics: ceftriaxone/Zithromax which will cover both respiratory and urinary pathogens.  -Flu test negative. -Continue supplemental oxygen. Active Problems:   Trichomoniasis -This is a sexually transmitted disease.  Treated with Flagyl. You must notify your sexual partners that you have been treated, so that they can get treated so you don't get re-infected.   Low potassium -Given potassium supplement.   High blood pressure -Your blood pressure has been a bit low, but we will give you your usual blood pressure medicines as your blood pressure allows.   Aneurysm -4.5 cm on CT scan. Will need followup with thoracic surgery. Control blood pressure.   Diabetes -Continue Lantus 40 units each bedtime. -Sugars uncontrolled. Continue sliding scale insulin. -Check hemoglobin A1c which is a blood test that will tell us what your average sugar is over a 3 month period of time.   High cholesterol -Continue Crestor.   OBSTRUCTIVE SLEEP APNEA -We encourage you to use your CPAP machine to prevent problems associated with untreated sleep apnea (worsening heart failure, high blood pressure problems).   Chronic heart failure (weak heart muscle) -Let the nurses know if you have worsening shortness of breath.   Chronic kidney disease -We are monitoring your kidney function closely.  Anticipated discharge date: 1-2 days, depending on progress.

## 2013-08-02 NOTE — Progress Notes (Signed)
Inpatient Diabetes Program Recommendations  AACE/ADA: New Consensus Statement on Inpatient Glycemic Control (2013)  Target Ranges:  Prepandial:   less than 140 mg/dL      Peak postprandial:   less than 180 mg/dL (1-2 hours)      Critically ill patients:  140 - 180 mg/dL   Results for BUBBA, DAYKIN (MRN [id number]) as of 08/02/2013 11:15  Ref. Range 08/01/2013 22:38 08/02/2013 07:29  Glucose-Capillary Latest Range: 70-99 mg/dL 258 (H) 275 (H)    Inpatient Diabetes Program Recommendations Insulin - Basal: Please consider increasing Lantus to 45 units QHS. Insulin - Meal Coverage: Please consider ordering Novolog 5 units TID with meals for meal coverage.  Note: Patient has a history of diabetes and takes Lantus 40 units QHS and Novolog 12 units TID as an outpatient for diabetes management.  Currently, patient is ordered to receive Lantus 40 units QHS, Novolog 0-20 units AC, and Novolog 0-5 units HS for inpatient glycemic control.  Fasting glucose this morning 275 mg/dl despite receiving Lantus 40 units last night at bedtime.  Please consider increasing Lantus to 45 units QHS and ordering Novolog 5 units TID with meals for meal coverage.  Last A1C in the chart was 9.6% on 08/24/2012 and current A1C is pending.  Will continue to follow.  Thanks, Barnie Alderman, RN, MSN, CCRN Diabetes Coordinator Inpatient Diabetes Program [phone] (Team Pager) [phone] ([place] office) [phone] [facility] office)

## 2013-08-02 NOTE — Care Management Note (Addendum)
    Page 1 of 2   08/05/2013     2:05:05 PM   CARE MANAGEMENT NOTE 08/05/2013  Patient:  Kurt Cross,Kurt Cross   Account Number:  [id number]  Date Initiated:  08/02/2013  Documentation initiated by:  [facility]  Subjective/Objective Assessment:   62 Y/O M ADMITTED W/SOB.CHF.     Action/Plan:   FROM HOME W/SPOUSE.HAS HOME 02-[facility].HAS CANE,RW.   Anticipated DC Date:  08/05/2013   Anticipated DC Plan:  [place]  CM consult      Choice offered to / List presented to:  C-1 Patient        [place] arranged  HH-1 RN  HH-3 OT      [place].   Status of service:  Completed, signed off Medicare Important Message given?   (If response is "NO", the following Medicare IM given date fields will be blank) Date Medicare IM given:   Date Additional Medicare IM given:    Discharge Disposition:  [name]  Per UR Regulation:  Reviewed for med. necessity/level of care/duration of stay  If discussed at [place] of Stay Meetings, dates discussed:    Comments:  08/05/13 [name] RN,BSN NCM [phone] D/C HOME [place].[facility] REP STEPHANIE AWARE OF D/C & HH ORDERS.ALREADY HAS HOME 02-[facility].  08/04/13 [name] RN,BSN NCM [phone] [facility] FOLLOWING FOR HHRN/PT.WILL NEED FINAL HH ORDERS.TC DR.JAMES KIM OFFICE [id number] SPOKE TO ANN(NURSE),THERE WAS Cross SLEEP STUDY ORDERED BUT WAS CANCELLED TWICE BY PATIENT.PATIENT DOES NOT HAVE Cross CPAP MACHINE.SINCE SLEEP STUDY IS OUTPATIENT,WILL NEED TO BE SET UP BY DR. Jeneen Rinks KIM'S OFFICE ONCE D/C.  08/03/13 [name] RN,BSN NCM [phone] AWAIT PT EVAL & RECOMMENDATIONS. PATIENT STATES HE HAS HAD Cross SLEEP STUDY DONE THROUGH DR. Jeneen Rinks KIM OFFICE-[phone],BUT DOES NOT HAVE Cross CPAP MACHINE @ HOME.[place] DME REP-[place] AWARE & FOLLOWING.WOULD RECOMMEND PT EVAL.RECOMMEND HHRN-CHF PROTOCAL.ALREADY USES HOME 02-[facility].THN-LONG TERM CASE MGMNT ALSO FOLLOWING.  08/02/13 [name] RN,BSN  NCM [phone] PATIENT STATES HAS CONCERNS ABOUT HOME 02 TANK REFILLS,& TUBING,ALSO CPAP-STATES HE HAS FOLLOWED ALL STEPS BUT STILL DOES NOT HAVE HIS CPAP.TC KRISTEN [facility] REP, INFORMED HER OF ALL CONCERNS,WILL HAVE DME REP LOOK INTO CONCERNS & GET BACK TO PATIENT.WOULD RECOMMEND HHRN-CHF PROTOCAL, & PT EVAL.IF HHC NEEDED PATIENT AGREES TO [facility]-KRISTEN AWARE.

## 2013-08-02 NOTE — Progress Notes (Signed)
B/p 90/44 Dr Rockne Menghini notified and order to hold Lasix and coreg today  obtained.

## 2013-08-02 NOTE — Progress Notes (Signed)
[facility]  Patient Status: New  [facility] is providing the following services: Following for Home Health needs at d/c  If patient discharges after hours, please call [phone].   Lurlean Leyden 08/02/2013, 2:03 PM

## 2013-08-03 DIAGNOSIS — K5909 Other constipation: Secondary | ICD-10-CM | POA: Diagnosis present

## 2013-08-03 HISTORY — DX: Other constipation: K59.09

## 2013-08-03 LAB — CBC
HCT: 42.7 % (ref 39.0–52.0)
Hemoglobin: 14.6 g/dL (ref 13.0–17.0)
MCHC: 34.2 g/dL (ref 30.0–36.0)
RBC: 4.42 MIL/uL (ref 4.22–5.81)
WBC: 14.9 10*3/uL — ABNORMAL HIGH (ref 4.0–10.5)

## 2013-08-03 LAB — GLUCOSE, CAPILLARY
Glucose-Capillary: 205 mg/dL — ABNORMAL HIGH (ref 70–99)
Glucose-Capillary: 177 mg/dL — ABNORMAL HIGH (ref 70–99)

## 2013-08-03 LAB — URINE CULTURE: Colony Count: NO GROWTH

## 2013-08-03 LAB — BASIC METABOLIC PANEL
BUN: 21 mg/dL (ref 6–23)
CO2: 27 mEq/L (ref 19–32)
Chloride: 93 mEq/L — ABNORMAL LOW (ref 96–112)
Glucose, Bld: 182 mg/dL — ABNORMAL HIGH (ref 70–99)
Potassium: 3.6 mEq/L (ref 3.5–5.1)

## 2013-08-03 MED ORDER — INSULIN ASPART 100 UNIT/ML ~~LOC~~ SOLN
10.0000 [IU] | Freq: Three times a day (TID) | SUBCUTANEOUS | Status: DC
  Administered 2013-08-03 – 2013-08-05 (×6): 10 [IU] via SUBCUTANEOUS

## 2013-08-03 MED ORDER — INSULIN GLARGINE 100 UNIT/ML ~~LOC~~ SOLN
45.0000 [IU] | Freq: Every day | SUBCUTANEOUS | Status: DC
  Administered 2013-08-03 – 2013-08-04 (×2): 45 [IU] via SUBCUTANEOUS
  Filled 2013-08-03 (×4): qty 0.45

## 2013-08-03 MED ORDER — POLYETHYLENE GLYCOL 3350 17 G PO PACK
17.0000 g | PACK | Freq: Every day | ORAL | Status: DC
  Administered 2013-08-03 – 2013-08-04 (×2): 17 g via ORAL
  Filled 2013-08-03 (×3): qty 1

## 2013-08-03 MED ORDER — MAGNESIUM HYDROXIDE 400 MG/5ML PO SUSP
30.0000 mL | Freq: Once | ORAL | Status: AC
  Administered 2013-08-03: 30 mL via ORAL
  Filled 2013-08-03: qty 30

## 2013-08-03 MED ORDER — BISACODYL 10 MG RE SUPP: 10.0000 mg | Freq: Every day | RECTAL | Status: DC | PRN

## 2013-08-03 MED ORDER — INSULIN ASPART 100 UNIT/ML ~~LOC~~ SOLN
0.0000 [IU] | Freq: Three times a day (TID) | SUBCUTANEOUS | Status: DC
  Administered 2013-08-03: 7 [IU] via SUBCUTANEOUS
  Administered 2013-08-03 – 2013-08-04 (×2): 3 [IU] via SUBCUTANEOUS
  Administered 2013-08-04: 7 [IU] via SUBCUTANEOUS
  Administered 2013-08-04: 13:00:00 4 [IU] via SUBCUTANEOUS
  Administered 2013-08-05 (×2): 7 [IU] via SUBCUTANEOUS

## 2013-08-03 MED ORDER — INSULIN ASPART 100 UNIT/ML ~~LOC~~ SOLN: 0.0000 [IU] | Freq: Every day | SUBCUTANEOUS | Status: DC

## 2013-08-03 MED ORDER — LIVING WELL WITH DIABETES BOOK
Freq: Once | Status: AC
  Administered 2013-08-03: 14:00:00
  Filled 2013-08-03: qty 1

## 2013-08-03 MED ORDER — INSULIN ASPART 100 UNIT/ML ~~LOC~~ SOLN
6.0000 [IU] | Freq: Three times a day (TID) | SUBCUTANEOUS | Status: DC
  Administered 2013-08-03: 6 [IU] via SUBCUTANEOUS

## 2013-08-03 NOTE — Progress Notes (Signed)
Inpatient Diabetes Program Recommendations  AACE/ADA: New Consensus Statement on Inpatient Glycemic Control (2013)  Target Ranges:  Prepandial:   less than 140 mg/dL      Peak postprandial:   less than 180 mg/dL (1-2 hours)      Critically ill patients:  140 - 180 mg/dL   Results for Kurt Cross, Kurt Cross (MRN [id number]) as of 08/03/2013 13:38  Ref. Range 08/01/2013 22:38 08/02/2013 07:29 08/02/2013 11:57 08/02/2013 17:39 08/02/2013 22:16 08/03/2013 08:23 08/03/2013 12:20  Glucose-Capillary Latest Range: 70-99 mg/dL 258 (H) 275 (H) 304 (H) 151 (H) 221 (H) 205 (H) 202 (H)    Inpatient Diabetes Program Recommendations Insulin - Basal: Please consider increasing Lantus to 45 units QHS. Insulin - Meal Coverage: Please consider increasing Novlog meal coverage to 10 units TID with meals.  Thanks, Barnie Alderman, RN, MSN, CCRN Diabetes Coordinator Inpatient Diabetes Program [phone] (Team Pager) [phone] ([place] office) [phone] [facility] office)

## 2013-08-03 NOTE — Progress Notes (Signed)
[facility] PROGRESS NOTE  Kurt Cross [id number] DOB: 1950/11/17 DOA: 08/01/2013 PCP: [name], MD  Brief narrative: Kurt Cross is an 62 y.o. male with a PMH of known chronic systolic CHF status post defibrillator placement, last EF measured in 2013 was 35-40%, chronic respiratory failure on home oxygen, OS A. noncompliant with CPAP, diabetes, hypertension and hyperlipidemia who was admitted on 08/02/2013 with presumed pneumonia.  Assessment/Plan: Principal Problem:   Sepsis with Acute-on-chronic respiratory failure / shortness of breath / fever / secondary to CAP versus UTI -Patient meets criteria for sepsis given leukocytosis, fever, a documented respiratory rate of 22 on admission and suspected source of infection. -Continue empiric ceftriaxone/Zithromax which will cover both respiratory and urinary pathogens. Trichomonas noted on urinalysis. WBC declining. -Influenza screen negative. Strep pneumonia negative. Legionella antigen negative. -Continue supplemental oxygen. -Blood cultures not collected on admission. Will order for temperature greater than 101. Active Problems:   Trichomoniasis -Given 2 grams of Flagyl x 1. Wife counseled regarding need for treatment. Followup urine culture.   Hypokalemia -Repleted.   Hypertension -Continue Lasix, spironolactone and lisinopril. Resume Coreg.   Ascending aortic aneurysm -4.5 cm on CT scan. Will need followup with thoracic surgery. Control blood pressure.   DM -Sugars uncontrolled. CBGs 151-275. Continue insulin resistant SSI.  Add 10 units of Novolog Q AC and increase Lantus to 45 units per DM coordinator recommendations. -Hemoglobin A1c 9.5%.   HYPERLIPIDEMIA -Continue Crestor.   OBSTRUCTIVE SLEEP APNEA -History of noncompliance with CPAP.   Chronic systolic CONGESTIVE HEART FAILURE -Status post defibrillator. -Continue home dose of Lasix. -Monitor I and O. balance/daily weight. -Troponins negative. ProBNP 481.    Stage III chronic kidney disease -Baseline creatinine 1.2-1.3.   Constipation -MOM ordered.  Dulcolax suppository if no results.  Code Status: DNR. Family Communication: No family at bedside. Disposition Plan: Home when stable.   Medical Consultants:  None.  Other Consultants:  None.  Anti-infectives:  Rocephin 08/01/2013--->  Azithromycin 08/01/2013--->   HPI/Subjective: Kurt Cross is much more awake and alert.  Complains of constipation.  Breathing improved.  No cough.  No chest pain..  Objective: Filed Vitals:   08/02/13 [id number] 08/02/13 1424 08/02/13 2210 08/03/13 0652  BP: 90/44 110/65 125/65 118/60  Pulse: 71 67 90 71  Temp: 98 F (36.7 C) 99.6 F (37.6 C) 99.9 F (37.7 C) 98.2 F (36.8 C)  TempSrc: Oral Oral Oral Oral  Resp:  18 18 18   Height:      Weight:    158.532 kg (349 lb 8 oz)  SpO2:  100% 99% 96%    Intake/Output Summary (Last 24 hours) at 08/03/13 0801 Last data filed at 08/03/13 0700  Gross per 24 hour  Intake   1500 ml  Output   1175 ml  Net    325 ml    Exam: Gen:  Lethargic. Cardiovascular:  RRR, No M/R/G Respiratory:  Lungs diminished Gastrointestinal:  Abdomen soft, NT/ND, + BS Extremities:  + edema  Data Reviewed: Basic Metabolic Panel:  Recent Labs Lab 08/01/13 1649 08/02/13 0505 08/03/13 0526  NA 132* 132* 131*  K 3.8 3.1* 3.6  CL 93* 94* 93*  CO2 29 27 27   GLUCOSE 254* 334* 182*  BUN 23 23 21   CREATININE 1.50* 1.42* 1.54*  CALCIUM 9.6 9.1 9.8   GFR Estimated Creatinine Clearance: 78.4 ml/min (by C-G formula based on Cr of 1.54). Liver Function Tests:  Recent Labs Lab 08/01/13 1649  AST 36  ALT 25  ALKPHOS  33*  BILITOT 1.9*  PROT 7.6  ALBUMIN 3.0*   CBC:  Recent Labs Lab 08/01/13 1649 08/02/13 0505 08/03/13 0526  WBC 20.1* 17.1* 14.9*  NEUTROABS 16.6*  --   --   HGB 15.1 13.3 14.6  HCT 42.8 38.6* 42.7  MCV 96.2 96.5 96.6  PLT 171 168 190   Cardiac Enzymes:  Recent Labs Lab  08/01/13 1649 08/01/13 2315  TROPONINI <0.30 <0.30   BNP (last 3 results)  Recent Labs  08/24/12 1050 08/27/12 0500 08/01/13 1649  PROBNP 1036.0* 253.4* 481.4*   CBG:  Recent Labs Lab 08/01/13 2238 08/02/13 0729 08/02/13 1157 08/02/13 1739 08/02/13 2216  GLUCAP 258* 275* 304* 151* 221*   Hgb A1c  Recent Labs  08/02/13 0505  HGBA1C 9.5*   Microbiology Recent Results (from the past 240 hour(s))  URINE CULTURE     Status: None   Collection Time    08/02/13  3:00 AM      Result Value Range Status   Specimen Description URINE, CLEAN CATCH   Final   Special Requests Rocephin/azithromycin   Final   Culture  Setup Time     Final   Value: 08/02/2013 11:04     Performed at [facility]     Final   Value: NO GROWTH     Performed at [facility]   Culture     Final   Value: NO GROWTH     Performed at [facility]   Report Status 08/03/2013 FINAL   Final     Procedures and Diagnostic Studies: Dg Chest 2 View  08/01/2013   CLINICAL DATA:  Chest pain, shortness of breath.  EXAM: CHEST  2 VIEW  COMPARISON:  August 24, 2012.  FINDINGS: Stable cardiomegaly. Left-sided pacemaker is noted. No pleural effusion or pneumothorax is noted. Mild central pulmonary vascular congestion is noted which appears to be chronic. No acute pulmonary disease is noted.  IMPRESSION: No active cardiopulmonary disease.   Electronically Signed   By: Sabino Dick M.D.   On: 08/01/2013 17:06   Ct Angio Chest Pe W/cm &/or Wo Cm  08/01/2013   CLINICAL DATA:  Shortness of Breath,  EXAM: CT ANGIOGRAPHY CHEST WITH CONTRAST  TECHNIQUE: Multidetector CT imaging of the chest was performed using the standard protocol during bolus administration of intravenous contrast. Multiplanar CT image reconstructions including MIPs were obtained to evaluate the vascular anatomy.  CONTRAST:  185mL OMNIPAQUE IOHEXOL 350 MG/ML SOLN  COMPARISON:  06/28/2012  FINDINGS: There is fairly  good contrast opacification of the pulmonary artery branches with no convincing filling defects to suggest acute PE. There is good contrast opacification of the thoracic aorta with some scattered plaque, no evidence of dissection or stenosis. There is dilatation of the ascending aorta measuring up to 4.5 cm transverse diameter, stable by my measurement since prior study. Bovine brachiocephalic arterial origin anatomy without proximal stenosis. No pleural or pericardial effusion. No hilar or mediastinal adenopathy. Transvenous pacing leads are noted. Minimal linear scarring or subsegmental atelectasis in both lower lobes. Lungs otherwise clear. Visualized portions of upper abdomen unremarkable. Multiple bridging osteophytes throughout the mid and lower thoracic spine. Sternum intact.  Review of the MIP images confirms the above findings.  IMPRESSION: 1. 4.5 cm ascending aortic aneurysm without complicating features. 2. No evidence of acute pulmonary embolism or aortic dissection.   Electronically Signed   By: Arne Cleveland M.D.   On: 08/01/2013 19:55    Scheduled  Meds: . antiseptic oral rinse  15 mL Mouth Rinse BID  . atorvastatin  40 mg Oral q1800  . azithromycin  500 mg Intravenous Q24H  . carvedilol  6.25 mg Oral BID WC  . cefTRIAXone (ROCEPHIN)  IV  1 g Intravenous Q24H  . enoxaparin (LOVENOX) injection  80 mg Subcutaneous QHS  . furosemide  80 mg Oral BID  . insulin aspart  0-20 Units Subcutaneous TID WC  . insulin aspart  0-5 Units Subcutaneous QHS  . insulin glargine  40 Units Subcutaneous QHS  . lisinopril  10 mg Oral BID  . potassium chloride  20 mEq Oral BID  . sodium chloride  3 mL Intravenous Q12H  . sodium chloride  3 mL Intravenous Q12H  . spironolactone  100 mg Oral Daily   Continuous Infusions:   Time spent: 25 minutes.    LOS: 2 days   RAMA,CHRISTINA  [facility] Pager [phone].   *Please note that the hospitalists switch teams on Wednesdays. Please call the  flow manager at [phone] if you are having difficulty reaching the hospitalist taking care of this patient as she can update you and provide the most up-to-date pager number of provider caring for the patient. If 8PM-8AM, please contact night-coverage at [email or web address], password [email or web address]  08/03/2013, 8:01 AM    Information printed out, explained, and given to the patient:  In an effort to keep you and your family informed about your hospital stay, I am providing you with this information sheet. If you or your family have any questions, please do not hesitate to have the nursing staff page me to set up a meeting time.  Kurt Cross 08/03/2013 2 (Number of days in the hospital)  Treatment team:  Dr. Margreta Journey Rama, Hospitalist (Internist)  Active Treatment Issues with Plan: Principal Problem:   Infection with shortness of breath / fever / possible urinary tract infection versus bronchitis -Currently being treated with antibiotics: ceftriaxone/Zithromax which will cover both respiratory and urinary pathogens.  -Flu test negative. -Continue supplemental oxygen. Active Problems:   Trichomoniasis -This is a sexually transmitted disease.  Treated with Flagyl. You must notify your sexual partners that you have been treated, so that they can get treated so you don't get re-infected.   Low potassium -Given potassium supplement.   High blood pressure -Your blood pressure has been a bit low, but we will give you your usual blood pressure medicines as your blood pressure allows.   Aneurysm -4.5 cm on CT scan. Will need followup with thoracic surgery. Control blood pressure.   Diabetes -Continue Lantus 40 units each bedtime. -Sugars uncontrolled. Continue sliding scale insulin. -Check hemoglobin A1c which is a blood test that will tell us what your average sugar is over a 3 month period of time.   High cholesterol -Continue Crestor.   OBSTRUCTIVE SLEEP APNEA -We encourage you to use your CPAP  machine to prevent problems associated with untreated sleep apnea (worsening heart failure, high blood pressure problems).   Chronic heart failure (weak heart muscle) -Let the nurses know if you have worsening shortness of breath.   Chronic kidney disease -We are monitoring your kidney function closely.  Anticipated discharge date: 1-2 days, depending on progress.

## 2013-08-03 NOTE — Progress Notes (Signed)
Met with Kurt Cross at bedside to explain [facility] services. He was agreeable and consents were signed. He reports he lives with his wife at home. It appears he will also have home health services as well. Left [facility] Care Management packet at bedside and explained that [facility] services will not interfere with home health services that have been arranged. He will receive post hospital discharge call and will be evaluated for monthly home visits. Marthenia Rolling, MSN-Ed, RN,BSN- [facility] [phone]

## 2013-08-04 DIAGNOSIS — J9620 Acute and chronic respiratory failure, unspecified whether with hypoxia or hypercapnia: Secondary | ICD-10-CM

## 2013-08-04 DIAGNOSIS — I5042 Chronic combined systolic (congestive) and diastolic (congestive) heart failure: Secondary | ICD-10-CM

## 2013-08-04 DIAGNOSIS — I5023 Acute on chronic systolic (congestive) heart failure: Secondary | ICD-10-CM

## 2013-08-04 DIAGNOSIS — R0609 Other forms of dyspnea: Secondary | ICD-10-CM

## 2013-08-04 HISTORY — DX: Chronic combined systolic (congestive) and diastolic (congestive) heart failure: I50.42

## 2013-08-04 LAB — GLUCOSE, CAPILLARY
Glucose-Capillary: 142 mg/dL — ABNORMAL HIGH (ref 70–99)
Glucose-Capillary: 168 mg/dL — ABNORMAL HIGH (ref 70–99)
Glucose-Capillary: 207 mg/dL — ABNORMAL HIGH (ref 70–99)

## 2013-08-04 LAB — BASIC METABOLIC PANEL
BUN: 20 mg/dL (ref 6–23)
CO2: 27 mEq/L (ref 19–32)
Chloride: 95 mEq/L — ABNORMAL LOW (ref 96–112)
Creatinine, Ser: 1.32 mg/dL (ref 0.50–1.35)
Glucose, Bld: 165 mg/dL — ABNORMAL HIGH (ref 70–99)
Potassium: 3.6 mEq/L (ref 3.5–5.1)

## 2013-08-04 LAB — CBC
HCT: 39.1 % (ref 39.0–52.0)
Hemoglobin: 13.3 g/dL (ref 13.0–17.0)
MCH: 32.4 pg (ref 26.0–34.0)
MCV: 95.4 fL (ref 78.0–100.0)
Platelets: 214 10*3/uL (ref 150–400)
WBC: 13.7 10*3/uL — ABNORMAL HIGH (ref 4.0–10.5)

## 2013-08-04 NOTE — Evaluation (Signed)
Physical Therapy Evaluation Patient Details Name: Kurt Cross MRN: [id number] DOB: 1951/01/04 Today's Date: 08/04/2013 Time: [id number] PT Time Calculation (min): 15 min  PT Assessment / Plan / Recommendation History of Present Illness  Pt is a 62 y.o. male with known history of chronic systolic heart failure status post defibrillator placement last EF measured was in 2013 was 35-40%, on home oxygen, OSA noncompliant with CPAP, diabetes mellitus, hypertension and hyperlipidemia presented to the ER because of shortness of breath and fever and chills. Rate has been having these symptoms for last 4 days which has been progressively worsening. Patient has short of breath at rest increased on lying down in exertion. Has been having some nonproductive cough. Denies any chest pain. Denies any nausea vomiting abdominal pain or diarrhea. In the ER patient had a CT angiogram of the chest which is negative for anything acute but does show aortic aneurysm. Patient has been admitted for presumptive diagnosis of pneumonia.   Clinical Impression  Pt admitted with above. Pt currently presenting with functional limitations due to deficits listed below (see PT Problem List). Pt would benefit from skilled PT in order to increase independence and safety during mobility to allow d/c to venue below. Pt able to ambulate 225' with RW and min guard to min A today on 3L of O2 [place] with no c/o SOB.    PT Assessment  Patient needs continued PT services    Follow Up Recommendations  Home health PT    Does the patient have the potential to tolerate intense rehabilitation      Barriers to Discharge        Equipment Recommendations  None recommended by PT    Recommendations for Other Services     Frequency Min 3X/week    Precautions / Restrictions Precautions Precautions: Fall Restrictions Weight Bearing Restrictions: No   Pertinent Vitals/Pain Pt reports chronic bil LE pain (10/10) but pt agreeable to ambulation  and did not experience increase in pain during amb. SaO2 after ambulation while seated in chair on 2.5L of O2 [place]: 91-92%. Pt positioned to comfort at end of session.      Mobility  Bed Mobility Bed Mobility: Supine to Sit;Sitting - Scoot to Edge of Bed Supine to Sit: 5: Supervision Sitting - Scoot to Edge of Bed: 5: Supervision Details for Bed Mobility Assistance: supervision to ensure safety. Transfers Transfers: Sit to Stand;Stand to Sit Sit to Stand: With upper extremity assist;From bed;4: Min assist Stand to Sit: To chair/3-in-1;With upper extremity assist;With armrests Details for Transfer Assistance: +2 assist to ensure safety. VC's for hand placement. Ambulation/Gait Ambulation/Gait Assistance: 4: Min assist Ambulation Distance (Feet): 225 Feet Assistive device: Rolling walker Ambulation/Gait Assistance Details: min A due to L lateral trunk lean with pt progressing to min guard to ensure safety. pt ambulated on 3L of O2 [place], with no reports of SOB during amb. SaO2 after ambulation in seated position: 91-92% on 2.5L of O2 . Gait Pattern: Step-through pattern;Decreased stride length;Wide base of support;Lateral trunk lean to left Gait velocity: decreased    Exercises     PT Diagnosis: Difficulty walking;Other (comment) (decreased endurance.)  PT Problem List: Decreased activity tolerance;Decreased balance;Decreased mobility;Decreased knowledge of use of DME;Pain PT Treatment Interventions: DME instruction;Gait training;Functional mobility training;Therapeutic activities;Therapeutic exercise;Balance training;Neuromuscular re-education;Patient/family education     PT Goals(Current goals can be found in the care plan section) Acute Rehab PT Goals Patient Stated Goal: none specified PT Goal Formulation: With patient Time For Goal Achievement: 08/18/13 Potential to  Achieve Goals: Good  Visit Information  Last PT Received On: 08/04/13 Assistance Needed: +2 (for  safety) History of Present Illness: Pt is a 62 y.o. male with known history of chronic systolic heart failure status post defibrillator placement last EF measured was in 2013 was 35-40%, on home oxygen, OSA noncompliant with CPAP, diabetes mellitus, hypertension and hyperlipidemia presented to the ER because of shortness of breath and fever and chills. Rate has been having these symptoms for last 4 days which has been progressively worsening. Patient has short of breath at rest increased on lying down in exertion. Has been having some nonproductive cough. Denies any chest pain. Denies any nausea vomiting abdominal pain or diarrhea. In the ER patient had a CT angiogram of the chest which is negative for anything acute but does show aortic aneurysm. Patient has been admitted for presumptive diagnosis of pneumonia.        Prior [place] expects to be discharged to:: Private residence Living Arrangements: Spouse/significant other;Children Available Help at Discharge: Family;Available 24 hours/day Type of Home: House Home Access: Ramped entrance Home Layout: One level Home Equipment: Walker - 2 wheels;Cane - single point Prior Function Level of Independence: Independent with assistive device(s) Communication Communication: No difficulties    Cognition  Cognition Arousal/Alertness: Awake/alert Behavior During Therapy: WFL for tasks assessed/performed Overall Cognitive Status: Within Functional Limits for tasks assessed    Extremity/Trunk Assessment Lower Extremity Assessment Lower Extremity Assessment: RLE deficits/detail;LLE deficits/detail RLE Deficits / Details: strength WFL with pt reporting numbness/tingling starting at knee and traveling distally to foot. LLE Deficits / Details: strength WFL with pt reporting numbness/tingling starting at knee and traveling distally to foot.   Balance    End of Session PT - End of Session Equipment Utilized During  Treatment: Oxygen Activity Tolerance: Patient tolerated treatment well Patient left: in chair;with call bell/phone within reach;with nursing/sitter in room Nurse Communication: Mobility status  GP     Audie Clear 08/04/2013, 1:39 PM

## 2013-08-04 NOTE — Progress Notes (Signed)
  RD consulted for nutrition education regarding diabetes.   Lab Results  Component Value Date   HGBA1C 9.5* 08/02/2013   Pt and pt's wife requesting both carbohydrate education and weight loss education.  RD provided "Carbohydrate Counting for People with Diabetes" and "5-Day 1800 Calorie Sample Menus"  handouts from the Academy of Nutrition and Dietetics. Discussed different food groups and their effects on blood sugar, emphasizing carbohydrate-containing foods. Provided list of carbohydrates and recommended serving sizes of common foods.  Discussed importance of controlled and consistent carbohydrate intake throughout the day. Provided examples of ways to balance meals/snacks and encouraged intake of high-fiber, whole grain complex carbohydrates. Encouraged pt/wife to choose low fat foods and to eat fruits and vegetables daily. Teach back method used. Pt eating lunch at time of visit; lunch tray included all food groups and 4 servings of carbohydrates.  Expect good compliance.  Body mass index is 48.97 kg/(m^2). Pt meets criteria for Obesity based on current BMI.  Current diet order is Carb Modified, patient is consuming approximately 100% of meals at this time. Labs and medications reviewed. No further nutrition interventions warranted at this time. RD contact information provided. If additional nutrition issues arise, please re-consult RD.  Pryor Ochoa RD, LDN Inpatient Clinical Dietitian Pager: [phone] After Hours Pager: [phone]

## 2013-08-04 NOTE — Evaluation (Signed)
I have reviewed this note and agree with all findings. Kati Maleeah Crossman, PT, DPT Pager: [phone]   

## 2013-08-04 NOTE — Progress Notes (Addendum)
[facility] PROGRESS NOTE  Kurt Cross [id number] DOB: 1951/05/20 DOA: 08/01/2013 PCP: [name], MD  Brief narrative: Pt is 62 y.o. male with a PMH of known chronic systolic CHF status post defibrillator placement, last EF measured in 2013 was 35-40%, chronic respiratory failure on home O2, OSA and noncompliant with CPAP, diabetes, hypertension and hyperlipidemia who was admitted on 08/02/2013 with presumed pneumonia.   Assessment/Plan:  Principal Problem:  Sepsis with Acute-on-chronic respiratory failure / shortness of breath / fever / secondary to CAP versus UTI  - Patient meets criteria for sepsis given leukocytosis, fever, a documented respiratory rate of 22 on admission and suspected source of infection.  - Continue empiric ceftriaxone/Zithromax day #4, WBC trending down  - Influenza screen negative. Strep pneumonia negative. Legionella antigen negative.  - Continue supplemental oxygen.  Active Problems:  Trichomoniasis  - Given 2 grams of Flagyl x 1. Wife counseled regarding need for treatment. Followup urine culture.  Hypokalemia  - supplemented and within normal limits this AM - BMP in AM Hypertension  - Continue Lasix, spironolactone and lisinopril. Coreg - reasonable control of BP inpatient  Ascending aortic aneurysm  - 4.5 cm on CT scan. Will need followup with thoracic surgery. Control blood pressure.  DM  - Continue insulin resistant SSI - continue 10 units of Novolog Q AC and Lantus to 45 units - Hemoglobin A1c 9.5%.  HYPERLIPIDEMIA  - Continue Crestor.  OBSTRUCTIVE SLEEP APNEA  - History of noncompliance with CPAP.  Chronic systolic CONGESTIVE HEART FAILURE  - Status post defibrillator.  - Continue home dose of Lasix.  - Monitor I and O. balance/daily weight.  - Troponins negative. ProBNP 481.  Stage III chronic kidney disease  - Baseline creatinine 1.2-1.3 - Cr is within normal limits this AM - BMP in AM Constipation  - MOM ordered. Dulcolax  suppository if no results.   Code Status: DNR.  Family Communication: No family at bedside.  Disposition Plan: Home when stable.   Medical Consultants:  None. Other Consultants:  Diabetic coordinator Physical Therapy - no PT follow up required. Anti-infectives:  Rocephin 08/01/2013--->  Azithromycin 08/01/2013--->  Leisa Lenz, MD  [facility] Pager [phone]  If 7PM-7AM, please contact night-coverage [email or web address] Password [email or web address] 08/04/2013, 11:26 AM   LOS: 3 days   HPI/Subjective: No events over 24 hours. NO chest pain or shortness of breath this AM.   Objective: Filed Vitals:   08/03/13 2018 08/04/13 0434 08/04/13 0830 08/04/13 1013  BP: 126/75 115/73 133/86 133/86  Pulse: 73 65 63   Temp: 99.7 F (37.6 C) 98.7 F (37.1 C) 98.3 F (36.8 C)   TempSrc: Oral Oral Oral   Resp: 19 19 18    Height:      Weight:  159.2 kg (350 lb 15.6 oz)    SpO2: 98% 98% 99%     Intake/Output Summary (Last 24 hours) at 08/04/13 1126 Last data filed at 08/04/13 1120  Gross per 24 hour  Intake   1023 ml  Output   1290 ml  Net   -267 ml    Exam:   General:  Pt is alert, follows commands appropriately, not in acute distress  Cardiovascular: Regular rate and rhythm, S1/S2, no murmurs, no rubs, no gallops  Respiratory: Clear to auscultation bilaterally, no wheezing, no crackles, no rhonchi  Abdomen: Soft, non tender, non distended, bowel sounds present, no guarding  Extremities: No edema, pulses DP and PT palpable bilaterally  Neuro: Grossly nonfocal  Data Reviewed: Basic Metabolic Panel:  Recent Labs Lab 08/01/13 1649 08/02/13 0505 08/03/13 0526 08/04/13 0509  NA 132* 132* 131* 133*  K 3.8 3.1* 3.6 3.6  CL 93* 94* 93* 95*  CO2 29 27 27 27   GLUCOSE 254* 334* 182* 165*  BUN 23 23 21 20   CREATININE 1.50* 1.42* 1.54* 1.32  CALCIUM 9.6 9.1 9.8 9.3   Liver Function Tests:  Recent Labs Lab 08/01/13 1649  AST 36  ALT 25  ALKPHOS 33*  BILITOT 1.9*   PROT 7.6  ALBUMIN 3.0*   CBC:  Recent Labs Lab 08/01/13 1649 08/02/13 0505 08/03/13 0526 08/04/13 0509  WBC 20.1* 17.1* 14.9* 13.7*  NEUTROABS 16.6*  --   --   --   HGB 15.1 13.3 14.6 13.3  HCT 42.8 38.6* 42.7 39.1  MCV 96.2 96.5 96.6 95.4  PLT 171 168 190 214   Cardiac Enzymes:  Recent Labs Lab 08/01/13 1649 08/01/13 2315  TROPONINI <0.30 <0.30   CBG:  Recent Labs Lab 08/03/13 0823 08/03/13 1220 08/03/13 1754 08/03/13 2111 08/04/13 0750  GLUCAP 205* 202* 145* 177* 142*    Recent Results (from the past 240 hour(s))  URINE CULTURE     Status: None   Collection Time    08/02/13  3:00 AM      Result Value Range Status   Specimen Description URINE, CLEAN CATCH   Final   Special Requests Rocephin/azithromycin   Final   Culture  Setup Time     Final   Value: 08/02/2013 11:04     Performed at [place]     Final   Value: NO GROWTH     Performed at [facility]   Culture     Final   Value: NO GROWTH     Performed at [facility]   Report Status 08/03/2013 FINAL   Final     Studies: No results found.  Scheduled Meds: . atorvastatin  40 mg Oral q1800  . azithromycin  500 mg Intravenous Q24H  . carvedilol  6.25 mg Oral BID WC  . cefTRIAXone   IV  1 g Intravenous Q24H  . enoxaparin  injection  80 mg Subcutaneous QHS  . furosemide  80 mg Oral BID  . insulin aspart  0-20 Units Subcutaneous TID WC  . insulin aspart  0-5 Units Subcutaneous QHS  . insulin aspart  10 Units Subcutaneous TID WC  . insulin glargine  45 Units Subcutaneous QHS  . lisinopril  10 mg Oral BID  . polyethylene glycol  17 g Oral Daily  . potassium chloride  20 mEq Oral BID  . spironolactone  100 mg Oral Daily

## 2013-08-04 NOTE — Progress Notes (Signed)
Inpatient Diabetes Program Recommendations  AACE/ADA: New Consensus Statement on Inpatient Glycemic Control (2013)  Target Ranges:  Prepandial:   less than 140 mg/dL      Peak postprandial:   less than 180 mg/dL (1-2 hours)      Critically ill patients:  140 - 180 mg/dL   Reason for Visit: HgbA1C of 9.5%  Results for Kurt Cross, Kurt Cross (MRN [id number]) as of 08/04/2013 16:46  Ref. Range 08/03/2013 17:54 08/03/2013 21:11 08/04/2013 07:50 08/04/2013 11:47  Glucose-Capillary Latest Range: 70-99 mg/dL 145 (H) 177 (H) 142 (H) 168 (H)   Blood sugars improved with increase of Lantus and meal coverage insulin.  Eating 90-100%. Pt states he may be interested in OP Diabetes Education.  Will place order for same. [facility] will call pt to set up appt after discharge.  Will continue to follow. Thank you. Lorenda Peck, RD, LDN, CDE Inpatient Diabetes Coordinator [phone]

## 2013-08-05 DIAGNOSIS — I5042 Chronic combined systolic (congestive) and diastolic (congestive) heart failure: Secondary | ICD-10-CM

## 2013-08-05 LAB — CBC
Hemoglobin: 13.6 g/dL (ref 13.0–17.0)
MCH: 32.8 pg (ref 26.0–34.0)
MCHC: 34 g/dL (ref 30.0–36.0)
Platelets: 236 10*3/uL (ref 150–400)
RDW: 14.4 % (ref 11.5–15.5)

## 2013-08-05 LAB — BASIC METABOLIC PANEL
Calcium: 9.9 mg/dL (ref 8.4–10.5)
GFR calc Af Amer: 68 mL/min — ABNORMAL LOW (ref >90)
GFR calc non Af Amer: 59 mL/min — ABNORMAL LOW (ref >90)
Glucose, Bld: 221 mg/dL — ABNORMAL HIGH (ref 70–99)
Sodium: 133 mEq/L — ABNORMAL LOW (ref 135–145)

## 2013-08-05 LAB — GLUCOSE, CAPILLARY

## 2013-08-05 MED ORDER — POLYETHYLENE GLYCOL 3350 17 G PO PACK
17.0000 g | PACK | Freq: Three times a day (TID) | ORAL | Status: DC
  Administered 2013-08-05 (×2): 17 g via ORAL
  Filled 2013-08-05 (×5): qty 1

## 2013-08-05 MED ORDER — INSULIN ASPART 100 UNIT/ML ~~LOC~~ SOLN: 10.0000 [IU] | Freq: Three times a day (TID) | SUBCUTANEOUS | Status: DC

## 2013-08-05 MED ORDER — ROSUVASTATIN CALCIUM 20 MG PO TABS: 20.0000 mg | ORAL_TABLET | ORAL | Status: DC

## 2013-08-05 MED ORDER — DSS 100 MG PO CAPS: 100.0000 mg | ORAL_CAPSULE | Freq: Two times a day (BID) | ORAL | Status: DC

## 2013-08-05 MED ORDER — OXYCODONE-ACETAMINOPHEN 5-325 MG PO TABS: 1.0000 | ORAL_TABLET | ORAL | Status: DC | PRN

## 2013-08-05 MED ORDER — INSULIN GLARGINE 100 UNIT/ML ~~LOC~~ SOLN: 45.0000 [IU] | Freq: Every day | SUBCUTANEOUS | Status: DC

## 2013-08-05 MED ORDER — POLYETHYLENE GLYCOL 3350 17 G PO PACK: 17.0000 g | PACK | Freq: Two times a day (BID) | ORAL | Status: DC

## 2013-08-05 MED ORDER — DOCUSATE SODIUM 100 MG PO CAPS
100.0000 mg | ORAL_CAPSULE | Freq: Two times a day (BID) | ORAL | Status: DC
  Administered 2013-08-05: 100 mg via ORAL
  Filled 2013-08-05 (×2): qty 1

## 2013-08-05 MED ORDER — LEVOFLOXACIN 750 MG PO TABS: 750.0000 mg | ORAL_TABLET | Freq: Every day | ORAL | Status: DC

## 2013-08-05 NOTE — Discharge Summary (Addendum)
Physician Discharge Summary  Kurt Cross [id number] DOB: September 07, 1951 DOA: 08/01/2013  PCP: [name], MD  Admit date: 08/01/2013 Discharge date: 08/05/2013  Recommendations for Outpatient Follow-up:  1. You can continue Levaquin 750 mg daily for possible pneumonia and UTI 2. You can continue lasix 80 mg twice a day. Please recheck BMP at PCP office in next 1-2 weeks to make sure potassium is WNL. You also take spironolactone which is potassium sparing so your PCP did not start you on potassium chloride supplementation. 3. Please note that we have increased your NovoLog regimen to 10 units 3 times a day and Lantus to  45 units at bedtime. 4.I called patient's PCP Dr. [name] to let hm know of patient's admission and need for follow up on aortic aneurysm identified on CT angio chest on this admission. Also, notified Dr. Maudie Mercury of patient's potassium. Patient is on Lasix 80 mg twice daily but because he is on spironolactone he is not on potassium supplementation. Dr. Maudie Mercury informed me they will check this outpatient.   Discharge Diagnoses:  Principal Problem:   Acute-on-chronic respiratory failure secondary to probable community-acquired pneumonia Active Problems:   Fever   Sepsis   DM   HYPERLIPIDEMIA   OBESITY   OBSTRUCTIVE SLEEP APNEA   Hypertension   Unspecified constipation   Chronic combined systolic and diastolic CHF (congestive heart failure)    Discharge Condition: Medically stable for discharge home today. HHPT and RN in place  Diet recommendation: low sodium, diabetic diet  History of present illness:  Pt is 62 y.o. male with a PMH of known chronic systolic CHF status post defibrillator placement, last EF measured in 2013 was 35-40%, chronic respiratory failure on home O2, OSA and noncompliant with CPAP, diabetes, hypertension and hyperlipidemia who was admitted on 08/02/2013 with presumed pneumonia and /or UTI.   Assessment/Plan:   Principal Problem:  Sepsis with  Acute-on-chronic respiratory failure / shortness of breath / fever / secondary to CAP versus UTI  - Patient met criteria for sepsis given leukocytosis, fever, a documented respiratory rate of 22 on admission and suspected source of infection - Patient was given empiric ceftriaxone and Zithromax for 5 days. He will be discharged with Levaquin for 5 days..  - Influenza screen negative. Strep pneumonia negative. Legionella antigen negative.   Active Problems:  Trichomoniasis  - Given 2 grams of Flagyl x 1. Wife counseled regarding need for treatment. Followup urine culture - no growth to date Hypokalemia  - supplemented and within normal limits  Hypertension  - Continue Lasix, spironolactone and lisinopril. Coreg  - reasonable control of BP inpatient  Ascending aortic aneurysm  - 4.5 cm on CT scan. Will need followup with thoracic surgery. Control blood pressure.  DM  - Continue insulin resistant SSI  - continue 10 units of Novolog Q AC and Lantus to 45 units  - Hemoglobin A1c 9.5%.  HYPERLIPIDEMIA  - Continue Crestor.  OBSTRUCTIVE SLEEP APNEA  - History of noncompliance with CPAP.  Chronic systolic CONGESTIVE HEART FAILURE  - Status post defibrillator.  - Continue home dose of Lasix.  - Troponins negative. ProBNP 481.  Stage III chronic kidney disease  - Baseline creatinine 1.2-1.3  - Cr is within normal limits   Constipation  - colace and miralax as prescribed  Code Status: DNR/DNI Family Communication: No family at bedside.   Medical Consultants:  None. Other Consultants:  Diabetic coordinator  Physical Therapy - no PT follow up required. Anti-infectives:  Rocephin 08/01/2013---> 08/05/2013  Azithromycin 08/01/2013---> 08/05/2013 Levaquin for 5 days on discharge    Signed:  Leisa Lenz, MD  [facility] 08/05/2013, 11:30 AM  Pager #: [phone]   Discharge Exam: Filed Vitals:   08/05/13 0543  BP: 124/54  Pulse: 75  Temp: 98.1 F (36.7 C)  Resp:  20   Filed Vitals:   08/04/13 1400 08/04/13 1730 08/04/13 2130 08/05/13 0543  BP: 95/48 128/74 147/91 124/54  Pulse: 62  77 75  Temp: 98.9 F (37.2 C)  98.7 F (37.1 C) 98.1 F (36.7 C)  TempSrc: Oral  Oral Oral  Resp: 17  20 20   Height:      Weight:    159.4 kg (351 lb 6.6 oz)  SpO2: 97%  98% 97%    General: Pt is alert, follows commands appropriately, not in acute distress Cardiovascular: Regular rate and rhythm, S1/S2 +, no murmurs, no rubs, no gallops Respiratory: Clear to auscultation bilaterally, no wheezing, no crackles, no rhonchi Abdominal: distended with abd hernia, bowel sounds +, no guarding Extremities: no edema, no cyanosis, pulses palpable bilaterally DP and PT Neuro: Grossly nonfocal  Discharge Instructions  Discharge Orders   Future Orders Complete By Expires   Ambulatory referral to Nutrition and Diabetic Education  As directed    Call MD for:  difficulty breathing, headache or visual disturbances  As directed    Call MD for:  persistant dizziness or light-headedness  As directed    Call MD for:  persistant nausea and vomiting  As directed    Call MD for:  severe uncontrolled pain  As directed    Diet - low sodium heart healthy  As directed    Discharge instructions  As directed    Comments:     1. You can continue Levaquin 750 mg daily for possible pneumonia and UTI 2. You can continue lasix 80 mg twice a day. Please recheck BMP at PCP office in next 1-2 weeks to make sure potassium is WNL. You also take spironolactone which is potassium sparing so your PCP did not start you on potassium chloride supplementation. 3. Please note that we have increased your NovoLog regimen to 10 units 3 times a day and Lantus to  45 units at bedtime.   Increase activity slowly  As directed        Medication List         carvedilol 6.25 MG tablet  Commonly known as:  COREG  Take 1 tablet (6.25 mg total) by mouth 2 (two) times daily with a meal.     DSS 100 MG Caps   Take 100 mg by mouth 2 (two) times daily.     furosemide 80 MG tablet  Commonly known as:  LASIX  Take 1 tablet (80 mg total) by mouth 2 (two) times daily.     insulin aspart 100 UNIT/ML injection  Commonly known as:  novoLOG  Inject 10 Units into the skin 3 (three) times daily with meals.     insulin glargine 100 UNIT/ML injection  Commonly known as:  LANTUS  Inject 0.45 mLs (45 Units total) into the skin at bedtime.     levofloxacin 750 MG tablet  Commonly known as:  LEVAQUIN  Take 1 tablet (750 mg total) by mouth daily.     lisinopril 10 MG tablet  Commonly known as:  PRINIVIL,ZESTRIL  Take 1 tablet (10 mg total) by mouth 2 (two) times daily.     oxyCODONE-acetaminophen 5-325 MG per tablet  Commonly known as:  PERCOCET/ROXICET  Take 1-2 tablets by mouth every 4 (four) hours as needed. For  pain     polyethylene glycol packet  Commonly known as:  MIRALAX / GLYCOLAX  Take 17 g by mouth 2 (two) times daily.     rosuvastatin 20 MG tablet  Commonly known as:  CRESTOR  Take 1 tablet (20 mg total) by mouth every other day.     spironolactone 100 MG tablet  Commonly known as:  ALDACTONE  Take 1 tablet (100 mg total) by mouth daily.           Follow-up Information   Follow up with [name], MD In 1 week. (recheck potassium )    Specialty:  Internal Medicine   Contact information:   [address] [place] [address] [phone]        The results of significant diagnostics from this hospitalization (including imaging, microbiology, ancillary and laboratory) are listed below for reference.    Significant Diagnostic Studies: Dg Chest 2 View  08/01/2013   CLINICAL DATA:  Chest pain, shortness of breath.  EXAM: CHEST  2 VIEW  COMPARISON:  August 24, 2012.  FINDINGS: Stable cardiomegaly. Left-sided pacemaker is noted. No pleural effusion or pneumothorax is noted. Mild central pulmonary vascular congestion is noted which appears to be chronic. No  acute pulmonary disease is noted.  IMPRESSION: No active cardiopulmonary disease.   Electronically Signed   By: Sabino Dick M.D.   On: 08/01/2013 17:06   Ct Angio Chest Pe W/cm &/or Wo Cm  08/01/2013   CLINICAL DATA:  Shortness of Breath,  EXAM: CT ANGIOGRAPHY CHEST WITH CONTRAST  TECHNIQUE: Multidetector CT imaging of the chest was performed using the standard protocol during bolus administration of intravenous contrast. Multiplanar CT image reconstructions including MIPs were obtained to evaluate the vascular anatomy.  CONTRAST:  154mL OMNIPAQUE IOHEXOL 350 MG/ML SOLN  COMPARISON:  06/28/2012  FINDINGS: There is fairly good contrast opacification of the pulmonary artery branches with no convincing filling defects to suggest acute PE. There is good contrast opacification of the thoracic aorta with some scattered plaque, no evidence of dissection or stenosis. There is dilatation of the ascending aorta measuring up to 4.5 cm transverse diameter, stable by my measurement since prior study. Bovine brachiocephalic arterial origin anatomy without proximal stenosis. No pleural or pericardial effusion. No hilar or mediastinal adenopathy. Transvenous pacing leads are noted. Minimal linear scarring or subsegmental atelectasis in both lower lobes. Lungs otherwise clear. Visualized portions of upper abdomen unremarkable. Multiple bridging osteophytes throughout the mid and lower thoracic spine. Sternum intact.  Review of the MIP images confirms the above findings.  IMPRESSION: 1. 4.5 cm ascending aortic aneurysm without complicating features. 2. No evidence of acute pulmonary embolism or aortic dissection.   Electronically Signed   By: Arne Cleveland M.D.   On: 08/01/2013 19:55    Microbiology: Recent Results (from the past 240 hour(s))  URINE CULTURE     Status: None   Collection Time    08/02/13  3:00 AM      Result Value Range Status   Specimen Description URINE, CLEAN CATCH   Final   Special Requests  Rocephin/azithromycin   Final   Culture  Setup Time     Final   Value: 08/02/2013 11:04     Performed at [facility]     Final   Value: NO GROWTH     Performed at [facility]  Culture     Final   Value: NO GROWTH     Performed at [facility]   Report Status 08/03/2013 FINAL   Final     Labs: Basic Metabolic Panel:  Recent Labs Lab 08/01/13 1649 08/02/13 0505 08/03/13 0526 08/04/13 0509 08/05/13 0530  NA 132* 132* 131* 133* 133*  K 3.8 3.1* 3.6 3.6 4.0  CL 93* 94* 93* 95* 95*  CO2 29 27 27 27 29   GLUCOSE 254* 334* 182* 165* 221*  BUN 23 23 21 20 20   CREATININE 1.50* 1.42* 1.54* 1.32 1.29  CALCIUM 9.6 9.1 9.8 9.3 9.9   Liver Function Tests:  Recent Labs Lab 08/01/13 1649  AST 36  ALT 25  ALKPHOS 33*  BILITOT 1.9*  PROT 7.6  ALBUMIN 3.0*   No results found for this basename: LIPASE, AMYLASE,  in the last 168 hours No results found for this basename: AMMONIA,  in the last 168 hours CBC:  Recent Labs Lab 08/01/13 1649 08/02/13 0505 08/03/13 0526 08/04/13 0509 08/05/13 0530  WBC 20.1* 17.1* 14.9* 13.7* 12.0*  NEUTROABS 16.6*  --   --   --   --   HGB 15.1 13.3 14.6 13.3 13.6  HCT 42.8 38.6* 42.7 39.1 40.0  MCV 96.2 96.5 96.6 95.4 96.4  PLT 171 168 190 214 236   Cardiac Enzymes:  Recent Labs Lab 08/01/13 1649 08/01/13 2315  TROPONINI <0.30 <0.30   BNP: BNP (last 3 results)  Recent Labs  08/24/12 1050 08/27/12 0500 08/01/13 1649  PROBNP 1036.0* 253.4* 481.4*   CBG:  Recent Labs Lab 08/03/13 1754 08/03/13 2111 08/04/13 0750 08/04/13 1147 08/04/13 1707  GLUCAP 145* 177* 142* 168* 207*    Time coordinating discharge: Over 30 minutes

## 2013-08-05 NOTE — Progress Notes (Signed)
I called patient's PCP Dr. [name] to let hm know of patient's admission and need for follow up on aortic aneurysm identified on CT angio chest on this admission. Also, notified Dr. Maudie Mercury of patient's potassium. Patient is on Lasix 80 mg twice daily but because he is on spironolactone he is not on potassium supplementation. Dr. Maudie Mercury informed me they will check this outpatient.  Leisa Lenz [facility] [phone]

## 2013-08-06 LAB — GLUCOSE, CAPILLARY: Glucose-Capillary: 204 mg/dL — ABNORMAL HIGH (ref 70–99)

## 2013-08-09 LAB — GLUCOSE, CAPILLARY: Glucose-Capillary: 176 mg/dL — ABNORMAL HIGH (ref 70–99)

## 2013-08-11 ENCOUNTER — Encounter (HOSPITAL_COMMUNITY): Payer: Self-pay | Admitting: Emergency Medicine

## 2013-08-11 ENCOUNTER — Inpatient Hospital Stay (HOSPITAL_COMMUNITY)
Admission: EM | Admit: 2013-08-11 | Discharge: 2013-10-15 | DRG: 901 | Disposition: A | Discharge status: Home / Self Care | Payer: Medicare Other | Attending: Internal Medicine | Admitting: Internal Medicine

## 2013-08-11 ENCOUNTER — Emergency Department (HOSPITAL_COMMUNITY): Payer: Medicare Other

## 2013-08-11 DIAGNOSIS — R578 Other shock: Secondary | ICD-10-CM | POA: Diagnosis present

## 2013-08-11 DIAGNOSIS — I428 Other cardiomyopathies: Secondary | ICD-10-CM | POA: Diagnosis present

## 2013-08-11 DIAGNOSIS — N179 Acute kidney failure, unspecified: Secondary | ICD-10-CM

## 2013-08-11 DIAGNOSIS — I712 Thoracic aortic aneurysm, without rupture: Secondary | ICD-10-CM | POA: Diagnosis present

## 2013-08-11 DIAGNOSIS — K432 Incisional hernia without obstruction or gangrene: Secondary | ICD-10-CM

## 2013-08-11 DIAGNOSIS — E66813 Obesity, class 3: Secondary | ICD-10-CM

## 2013-08-11 DIAGNOSIS — I714 Abdominal aortic aneurysm, without rupture, unspecified: Secondary | ICD-10-CM

## 2013-08-11 DIAGNOSIS — M79604 Pain in right leg: Secondary | ICD-10-CM | POA: Diagnosis present

## 2013-08-11 DIAGNOSIS — I5042 Chronic combined systolic (congestive) and diastolic (congestive) heart failure: Secondary | ICD-10-CM

## 2013-08-11 DIAGNOSIS — J189 Pneumonia, unspecified organism: Secondary | ICD-10-CM | POA: Diagnosis not present

## 2013-08-11 DIAGNOSIS — A411 Sepsis due to other specified staphylococcus: Secondary | ICD-10-CM | POA: Diagnosis present

## 2013-08-11 DIAGNOSIS — T8579XS Infection and inflammatory reaction due to other internal prosthetic devices, implants and grafts, sequela: Secondary | ICD-10-CM

## 2013-08-11 DIAGNOSIS — N390 Urinary tract infection, site not specified: Secondary | ICD-10-CM

## 2013-08-11 DIAGNOSIS — A419 Sepsis, unspecified organism: Secondary | ICD-10-CM

## 2013-08-11 DIAGNOSIS — I2510 Atherosclerotic heart disease of native coronary artery without angina pectoris: Secondary | ICD-10-CM | POA: Diagnosis present

## 2013-08-11 DIAGNOSIS — E119 Type 2 diabetes mellitus without complications: Secondary | ICD-10-CM

## 2013-08-11 DIAGNOSIS — T8579XA Infection and inflammatory reaction due to other internal prosthetic devices, implants and grafts, initial encounter: Principal | ICD-10-CM

## 2013-08-11 DIAGNOSIS — D72829 Elevated white blood cell count, unspecified: Secondary | ICD-10-CM | POA: Diagnosis present

## 2013-08-11 DIAGNOSIS — K5909 Other constipation: Secondary | ICD-10-CM

## 2013-08-11 DIAGNOSIS — G4733 Obstructive sleep apnea (adult) (pediatric): Secondary | ICD-10-CM

## 2013-08-11 DIAGNOSIS — E7800 Pure hypercholesterolemia, unspecified: Secondary | ICD-10-CM | POA: Diagnosis present

## 2013-08-11 DIAGNOSIS — Y838 Other surgical procedures as the cause of abnormal reaction of the patient, or of later complication, without mention of misadventure at the time of the procedure: Secondary | ICD-10-CM | POA: Diagnosis present

## 2013-08-11 DIAGNOSIS — Z7901 Long term (current) use of anticoagulants: Secondary | ICD-10-CM

## 2013-08-11 DIAGNOSIS — I959 Hypotension, unspecified: Secondary | ICD-10-CM

## 2013-08-11 DIAGNOSIS — E662 Morbid (severe) obesity with alveolar hypoventilation: Secondary | ICD-10-CM | POA: Diagnosis present

## 2013-08-11 DIAGNOSIS — K5900 Constipation, unspecified: Secondary | ICD-10-CM | POA: Diagnosis present

## 2013-08-11 DIAGNOSIS — M79609 Pain in unspecified limb: Secondary | ICD-10-CM | POA: Diagnosis present

## 2013-08-11 DIAGNOSIS — I129 Hypertensive chronic kidney disease with stage 1 through stage 4 chronic kidney disease, or unspecified chronic kidney disease: Secondary | ICD-10-CM | POA: Diagnosis present

## 2013-08-11 DIAGNOSIS — J9620 Acute and chronic respiratory failure, unspecified whether with hypoxia or hypercapnia: Secondary | ICD-10-CM

## 2013-08-11 DIAGNOSIS — Z87891 Personal history of nicotine dependence: Secondary | ICD-10-CM

## 2013-08-11 DIAGNOSIS — I509 Heart failure, unspecified: Secondary | ICD-10-CM | POA: Diagnosis present

## 2013-08-11 DIAGNOSIS — Z794 Long term (current) use of insulin: Secondary | ICD-10-CM

## 2013-08-11 DIAGNOSIS — I10 Essential (primary) hypertension: Secondary | ICD-10-CM

## 2013-08-11 DIAGNOSIS — T8579XD Infection and inflammatory reaction due to other internal prosthetic devices, implants and grafts, subsequent encounter: Secondary | ICD-10-CM

## 2013-08-11 DIAGNOSIS — B360 Pityriasis versicolor: Secondary | ICD-10-CM | POA: Diagnosis present

## 2013-08-11 DIAGNOSIS — N183 Chronic kidney disease, stage 3 unspecified: Secondary | ICD-10-CM | POA: Diagnosis present

## 2013-08-11 DIAGNOSIS — L02219 Cutaneous abscess of trunk, unspecified: Secondary | ICD-10-CM | POA: Diagnosis present

## 2013-08-11 DIAGNOSIS — I4892 Unspecified atrial flutter: Secondary | ICD-10-CM | POA: Diagnosis present

## 2013-08-11 DIAGNOSIS — Z6841 Body Mass Index (BMI) 40.0 and over, adult: Secondary | ICD-10-CM

## 2013-08-11 DIAGNOSIS — Z87440 Personal history of urinary (tract) infections: Secondary | ICD-10-CM

## 2013-08-11 DIAGNOSIS — Z95810 Presence of automatic (implantable) cardiac defibrillator: Secondary | ICD-10-CM

## 2013-08-11 DIAGNOSIS — Z9181 History of falling: Secondary | ICD-10-CM

## 2013-08-11 DIAGNOSIS — I4891 Unspecified atrial fibrillation: Secondary | ICD-10-CM

## 2013-08-11 DIAGNOSIS — Z91199 Patient's noncompliance with other medical treatment and regimen due to unspecified reason: Secondary | ICD-10-CM

## 2013-08-11 DIAGNOSIS — Z79899 Other long term (current) drug therapy: Secondary | ICD-10-CM

## 2013-08-11 DIAGNOSIS — R509 Fever, unspecified: Secondary | ICD-10-CM

## 2013-08-11 DIAGNOSIS — IMO0001 Reserved for inherently not codable concepts without codable children: Secondary | ICD-10-CM

## 2013-08-11 DIAGNOSIS — D509 Iron deficiency anemia, unspecified: Secondary | ICD-10-CM | POA: Diagnosis present

## 2013-08-11 DIAGNOSIS — Z9119 Patient's noncompliance with other medical treatment and regimen: Secondary | ICD-10-CM

## 2013-08-11 DIAGNOSIS — Z9981 Dependence on supplemental oxygen: Secondary | ICD-10-CM

## 2013-08-11 DIAGNOSIS — W19XXXA Unspecified fall, initial encounter: Secondary | ICD-10-CM

## 2013-08-11 DIAGNOSIS — E785 Hyperlipidemia, unspecified: Secondary | ICD-10-CM

## 2013-08-11 DIAGNOSIS — E1122 Type 2 diabetes mellitus with diabetic chronic kidney disease: Secondary | ICD-10-CM | POA: Diagnosis present

## 2013-08-11 DIAGNOSIS — R52 Pain, unspecified: Secondary | ICD-10-CM | POA: Diagnosis present

## 2013-08-11 DIAGNOSIS — K632 Fistula of intestine: Secondary | ICD-10-CM

## 2013-08-11 HISTORY — DX: Infection and inflammatory reaction due to other internal prosthetic devices, implants and grafts, initial encounter: T85.79XA

## 2013-08-11 HISTORY — DX: Pain in right leg: M79.604

## 2013-08-11 HISTORY — DX: Abdominal aortic aneurysm, without rupture: I71.4

## 2013-08-11 LAB — URINALYSIS, ROUTINE W REFLEX MICROSCOPIC
Glucose, UA: 100 mg/dL — AB
Nitrite: NEGATIVE
Specific Gravity, Urine: 1.025 (ref 1.005–1.030)
Urobilinogen, UA: 4 mg/dL — ABNORMAL HIGH (ref 0.0–1.0)
pH: 5 (ref 5.0–8.0)

## 2013-08-11 LAB — CBC
HCT: 40.3 % (ref 39.0–52.0)
Hemoglobin: 13.8 g/dL (ref 13.0–17.0)
MCH: 32.9 pg (ref 26.0–34.0)
MCV: 96 fL (ref 78.0–100.0)
RBC: 4.2 MIL/uL — ABNORMAL LOW (ref 4.22–5.81)
WBC: 17.8 10*3/uL — ABNORMAL HIGH (ref 4.0–10.5)

## 2013-08-11 LAB — BASIC METABOLIC PANEL
BUN: 23 mg/dL (ref 6–23)
CO2: 29 mEq/L (ref 19–32)
Calcium: 9.8 mg/dL (ref 8.4–10.5)
Chloride: 92 mEq/L — ABNORMAL LOW (ref 96–112)
Creatinine, Ser: 1.52 mg/dL — ABNORMAL HIGH (ref 0.50–1.35)
Glucose, Bld: 233 mg/dL — ABNORMAL HIGH (ref 70–99)
Potassium: 4.3 mEq/L (ref 3.5–5.1)

## 2013-08-11 LAB — URINE MICROSCOPIC-ADD ON

## 2013-08-11 LAB — POCT I-STAT TROPONIN I

## 2013-08-11 LAB — GLUCOSE, CAPILLARY: Glucose-Capillary: 240 mg/dL — ABNORMAL HIGH (ref 70–99)

## 2013-08-11 MED ORDER — ONDANSETRON HCL 4 MG PO TABS
4.0000 mg | ORAL_TABLET | Freq: Four times a day (QID) | ORAL | Status: DC | PRN
  Administered 2013-08-19: 4 mg via ORAL
  Filled 2013-08-11: qty 1

## 2013-08-11 MED ORDER — ENOXAPARIN SODIUM 80 MG/0.8ML ~~LOC~~ SOLN
80.0000 mg | SUBCUTANEOUS | Status: DC
  Administered 2013-08-11 – 2013-08-12 (×2): 80 mg via SUBCUTANEOUS
  Filled 2013-08-11 (×3): qty 0.8

## 2013-08-11 MED ORDER — OXYCODONE HCL 5 MG PO TABS
5.0000 mg | ORAL_TABLET | ORAL | Status: DC | PRN
  Administered 2013-08-12 – 2013-08-14 (×3): 5 mg via ORAL
  Filled 2013-08-11 (×3): qty 1

## 2013-08-11 MED ORDER — INSULIN GLARGINE 100 UNIT/ML ~~LOC~~ SOLN
45.0000 [IU] | Freq: Every day | SUBCUTANEOUS | Status: DC
  Administered 2013-08-11: 45 [IU] via SUBCUTANEOUS
  Filled 2013-08-11 (×2): qty 0.45

## 2013-08-11 MED ORDER — MORPHINE SULFATE 2 MG/ML IJ SOLN
2.0000 mg | INTRAMUSCULAR | Status: DC | PRN
  Administered 2013-08-12 (×4): 2 mg via INTRAVENOUS
  Filled 2013-08-11 (×4): qty 1

## 2013-08-11 MED ORDER — ACETAMINOPHEN 650 MG RE SUPP: 650.0000 mg | Freq: Four times a day (QID) | RECTAL | Status: DC | PRN

## 2013-08-11 MED ORDER — SODIUM CHLORIDE 0.9 % IJ SOLN
3.0000 mL | INTRAMUSCULAR | Status: DC | PRN
  Administered 2013-08-19 – 2013-09-07 (×2): 3 mL via INTRAVENOUS

## 2013-08-11 MED ORDER — FUROSEMIDE 80 MG PO TABS
80.0000 mg | ORAL_TABLET | Freq: Two times a day (BID) | ORAL | Status: DC
  Administered 2013-08-12 – 2013-08-14 (×4): 80 mg via ORAL
  Filled 2013-08-11 (×7): qty 1

## 2013-08-11 MED ORDER — INSULIN ASPART 100 UNIT/ML ~~LOC~~ SOLN
0.0000 [IU] | Freq: Three times a day (TID) | SUBCUTANEOUS | Status: DC
  Administered 2013-08-12: 08:00:00 11 [IU] via SUBCUTANEOUS
  Administered 2013-08-12: 12:00:00 4 [IU] via SUBCUTANEOUS
  Administered 2013-08-12: 15 [IU] via SUBCUTANEOUS
  Administered 2013-08-13 – 2013-08-14 (×5): 4 [IU] via SUBCUTANEOUS
  Administered 2013-08-14: 7 [IU] via SUBCUTANEOUS

## 2013-08-11 MED ORDER — ONDANSETRON HCL 4 MG/2ML IJ SOLN: 4.0000 mg | Freq: Four times a day (QID) | INTRAMUSCULAR | Status: DC | PRN

## 2013-08-11 MED ORDER — LISINOPRIL 10 MG PO TABS
10.0000 mg | ORAL_TABLET | Freq: Two times a day (BID) | ORAL | Status: DC
  Administered 2013-08-11 – 2013-08-14 (×6): 10 mg via ORAL
  Filled 2013-08-11 (×8): qty 1

## 2013-08-11 MED ORDER — SORBITOL 70 % SOLN
30.0000 mL | Freq: Every day | Status: DC | PRN
  Filled 2013-08-11: qty 30

## 2013-08-11 MED ORDER — ACETAMINOPHEN 325 MG PO TABS
650.0000 mg | ORAL_TABLET | Freq: Four times a day (QID) | ORAL | Status: DC | PRN
  Administered 2013-08-12 – 2013-08-13 (×2): 650 mg via ORAL
  Filled 2013-08-11 (×2): qty 2

## 2013-08-11 MED ORDER — MORPHINE SULFATE 4 MG/ML IJ SOLN
4.0000 mg | Freq: Once | INTRAMUSCULAR | Status: AC
  Administered 2013-08-11: 4 mg via INTRAVENOUS
  Filled 2013-08-11: qty 1

## 2013-08-11 MED ORDER — POLYETHYLENE GLYCOL 3350 17 G PO PACK
17.0000 g | PACK | Freq: Every day | ORAL | Status: DC | PRN
  Filled 2013-08-11: qty 1

## 2013-08-11 MED ORDER — ATORVASTATIN CALCIUM 40 MG PO TABS
40.0000 mg | ORAL_TABLET | ORAL | Status: DC
  Administered 2013-08-13 – 2013-10-14 (×32): 40 mg via ORAL
  Filled 2013-08-11 (×32): qty 1

## 2013-08-11 MED ORDER — SODIUM CHLORIDE 0.9 % IJ SOLN
3.0000 mL | Freq: Two times a day (BID) | INTRAMUSCULAR | Status: DC
  Administered 2013-08-11 – 2013-10-14 (×21): 3 mL via INTRAVENOUS

## 2013-08-11 MED ORDER — MAGNESIUM CITRATE PO SOLN: 1.0000 | Freq: Once | ORAL | Status: AC | PRN

## 2013-08-11 MED ORDER — OXYCODONE-ACETAMINOPHEN 5-325 MG PO TABS
1.0000 | ORAL_TABLET | ORAL | Status: DC | PRN
  Administered 2013-08-11 – 2013-08-13 (×2): 2 via ORAL
  Administered 2013-08-13: 1 via ORAL
  Administered 2013-08-13: 2 via ORAL
  Filled 2013-08-11: qty 1
  Filled 2013-08-11 (×3): qty 2

## 2013-08-11 MED ORDER — CARVEDILOL 6.25 MG PO TABS
6.2500 mg | ORAL_TABLET | Freq: Two times a day (BID) | ORAL | Status: DC
  Administered 2013-08-12 – 2013-08-16 (×7): 6.25 mg via ORAL
  Filled 2013-08-11 (×12): qty 1

## 2013-08-11 MED ORDER — SODIUM CHLORIDE 0.9 % IV SOLN
250.0000 mL | INTRAVENOUS | Status: DC | PRN
  Administered 2013-08-14: 17:00:00 10 mL/h via INTRAVENOUS
  Administered 2013-08-25 – 2013-08-28 (×2): 250 mL via INTRAVENOUS

## 2013-08-11 MED ORDER — DOCUSATE SODIUM 100 MG PO CAPS
100.0000 mg | ORAL_CAPSULE | Freq: Two times a day (BID) | ORAL | Status: DC
  Administered 2013-08-12 – 2013-08-14 (×3): 100 mg via ORAL
  Filled 2013-08-11 (×7): qty 1

## 2013-08-11 MED ORDER — INSULIN ASPART 100 UNIT/ML ~~LOC~~ SOLN
6.0000 [IU] | Freq: Three times a day (TID) | SUBCUTANEOUS | Status: DC
  Administered 2013-08-12 (×3): 6 [IU] via SUBCUTANEOUS

## 2013-08-11 MED ORDER — ALUM & MAG HYDROXIDE-SIMETH 200-200-20 MG/5ML PO SUSP
30.0000 mL | Freq: Four times a day (QID) | ORAL | Status: DC | PRN
  Administered 2013-08-15 – 2013-08-16 (×2): 30 mL via ORAL
  Filled 2013-08-11 (×3): qty 30

## 2013-08-11 NOTE — Progress Notes (Signed)
[facility] and EDSW consulted by EDP to see patient regarding placement.  As per record review, patient was discharged from [facility] on October 26 with diagnosis of pneumonia.  Patient was sent home with home health services by [facility] which included a visiting RN and OT.  EDCM was told by EDP that ED staff tried to ambulate patient but patient is unable to bear weight on right leg.  EDCM and EDSW spoke to patient and patient's wife at bedside.  As per patient's wife [name], patient has a walker, cane and cpap machine at home.Patient also wears oxygen at home which he gets through [facility].  Patient's wife reports she is with the patient all day and night.  Patient's wife also reports she has a patient at home who is quadrapalegic.  EDCM explained to patient and his wife that we would be able to add PT and a social worker to his existing home health services.  The social worker would be in place to help facilitate admission into a rehab if needed.  Patient's wife stated, "As long as we have some help with the bathroom thing, I can handle the rest."  [facility] also provided patient and his wife a list of private duty nursing services and explained it would be an out of pocket expense for them.  Patient and wife agreeable to go home with home health services but not tonight.  Patient was not comfortable going home tonight and was worried about "gettting around" and cost of the ambulance.  Patient explained that he cannot put weight on his right leg at all.  Discussed patient with [facility].

## 2013-08-11 NOTE — Progress Notes (Signed)
CSW consulted by EDP for possible placement.  CSW consulted with [facility] concerning home health services.   Pt currently has home oxygen, nurse, and occupational therapy.  Pts wife, [name], was at his bedside.   Pt reports concerns of being able to ambulate after a fall last night.  Pt reports that he cannot walk because it is too much pain. Pt and wife are open to adding more home health services and equipment that include physical therapy, social worker, bed pan and .  Wife reports that she is the primary care giver and feels that with the proper support that she can help pt at home.  Wife reports that in addition to her husband, that she is the care giver of a person who is quadraplegic and has moved into their home for safety reasons.  CSW gave family a listing of local SNFs to review and EDCM gave family a list of private duty nursing that might be able to assist wife with pts care.  Pts wife was appreciative of the information and reported that she would use the SNF information with the home health SW if needed, but would be willing to see how additional services worked out.  Pt was not comfortable going home tonight.  CSW and [facility] consulted with Dr. Grandville Silos, [place], [place] .08/11/2013

## 2013-08-11 NOTE — ED Provider Notes (Signed)
CSN: [id number]     Arrival date & time 08/11/13  1302 History   First MD Initiated Contact with Patient 08/11/13 1347     Chief Complaint  Patient presents with  . Weakness  . Leg Pain   (Consider location/radiation/quality/duration/timing/severity/associated sxs/prior Treatment) Patient is a 62 y.o. male presenting with hip pain.  Hip Pain This is a new problem. The current episode started yesterday. The problem occurs constantly. The problem has not changed since onset.Pertinent negatives include no chest pain, no abdominal pain and no shortness of breath. Exacerbated by: bearing weight. Nothing relieves the symptoms. Treatments tried: Percocet. The treatment provided moderate relief.    Past Medical History  Diagnosis Date  . Sleep apnea   . Diabetes mellitus   . Hypertension   . Hyperlipidemia   . CHF (congestive heart failure)   . Hernia    Past Surgical History  Procedure Laterality Date  . Appendectomy    . Tonsillectomy    . Hernia surgeries      multiple surgeries  . Nasal septum surgery     No family history on file. History  Substance Use Topics  . Smoking status: Former Smoker -- 1.00 packs/day for 10 years    Quit date: 08/21/2012  . Smokeless tobacco: Never Used  . Alcohol Use: No    Review of Systems  Constitutional: Negative for fever.  HENT: Negative for congestion.   Respiratory: Negative for cough and shortness of breath.   Cardiovascular: Negative for chest pain.  Gastrointestinal: Negative for nausea, vomiting, abdominal pain and diarrhea.  All other systems reviewed and are negative.    Allergies  Review of patient's allergies indicates no known allergies.  Home Medications   Current Outpatient Rx  Name  Route  Sig  Dispense  Refill  . carvedilol (COREG) 6.25 MG tablet   Oral   Take 1 tablet (6.25 mg total) by mouth 2 (two) times daily with a meal.   60 tablet   3   . furosemide (LASIX) 80 MG tablet   Oral   Take 1 tablet (80 mg  total) by mouth 2 (two) times daily.   60 tablet   3   . insulin aspart (NOVOLOG) 100 UNIT/ML injection   Subcutaneous   Inject 12 Units into the skin 2 (two) times daily.         . insulin regular (NOVOLIN R,HUMULIN R) 100 units/mL injection   Subcutaneous   Inject 40 Units into the skin 2 (two) times daily.         [name] Kitchen levofloxacin (LEVAQUIN) 750 MG tablet   Oral   Take 750 mg by mouth daily.         [name] Kitchen lisinopril (PRINIVIL,ZESTRIL) 10 MG tablet   Oral   Take 1 tablet (10 mg total) by mouth 2 (two) times daily.   60 tablet   3   . oxyCODONE-acetaminophen (PERCOCET/ROXICET) 5-325 MG per tablet   Oral   Take 1-2 tablets by mouth every 4 (four) hours as needed for moderate pain or severe pain. For  pain         . rosuvastatin (CRESTOR) 20 MG tablet   Oral   Take 20 mg by mouth every other day.          BP 113/72  Pulse 74  Temp(Src) 98.2 F (36.8 C) (Oral)  Resp 18  SpO2 94% Physical Exam  Nursing note and vitals reviewed. Constitutional: He is oriented to person, place, and time. He  appears well-developed and well-nourished. No distress.  obese  HENT:  Head: Normocephalic and atraumatic.  Mouth/Throat: Oropharynx is clear and moist.  Eyes: Conjunctivae are normal. Pupils are equal, round, and reactive to light. No scleral icterus.  Neck: Neck supple.  Cardiovascular: Normal rate, regular rhythm, normal heart sounds and intact distal pulses.   No murmur heard. Pulmonary/Chest: Effort normal and breath sounds normal. No stridor. No respiratory distress. He has no wheezes. He has no rales.  Abdominal: Soft. He exhibits no distension. There is no tenderness. There is no rigidity, no rebound and no guarding.  Large ventral hernia.  Musculoskeletal: Normal range of motion. He exhibits no edema.       Right hip: He exhibits tenderness and bony tenderness. He exhibits normal range of motion, normal strength, no swelling, no crepitus and no deformity.  Neurological:  He is alert and oriented to person, place, and time.  Skin: Skin is warm and dry. No rash noted.  Psychiatric: He has a normal mood and affect. His behavior is normal.    ED Course  Procedures (including critical care time) Labs Review Labs Reviewed  GLUCOSE, CAPILLARY - Abnormal; Notable for the following:    Glucose-Capillary 240 (*)    All other components within normal limits  CBC  BASIC METABOLIC PANEL   Imaging Review No results found.  EKG Interpretation     Ventricular Rate:  82 PR Interval:    QRS Duration: 114 QT Interval:  444 QTC Calculation: 519 R Axis:   69 Text Interpretation:  Atrial flutter/fibrillation Borderline intraventricular conduction delay Low voltage, extremity and precordial leads Prolonged QT interval compared to prior, no pacemaker spikes seen            MDM   1. Right leg pain   2. Fall, initial encounter    62 yo male with recent hospitalization for pneumonia presenting with right hip pain after a fall last night.  He states that he has been improving since being discharged, but still has some generalized weakness.  He was trying to pick something off the floor last night, but could stand back up straight, and fell backwards, hurting his right hip.  He was able to sleep last night, but this morning, he could not bear weight on his right hip.  He denies syncope.  He denies and other problems or pain.  His SOB is improving.  He has had no chest pain or fevers.  EKG shows a flutter consistent with prior.  His hip plain film is negative.  Given IV morphine.    Plain films negative.  After morphine, he was able to better localize his pain.  His pain was of his right hamstring and calf.  They appear intact on exam, but tender.  I considered occult hip fracture, but on my re-evaluation, he clearly stated that his pain was not in his hip.  He had good ROM of his hip without pain.  He was unable to ambulate, probably secondary to his acute pain and  his deconditioning from his prior hospitalization.  I discussed case with Dr. Grandville Silos (Internal Medicine) and case management for possible admission, physical therapy evaluation, and/or rehab.    Houston Siren, MD 08/11/13 1723

## 2013-08-11 NOTE — H&P (Addendum)
[facility] History and Physical  BUFF STILWELL [id number] DOB: 06/21/1951 DOA: 08/11/2013  Referring physician:  Dr Mingo Amber PCP: [name], MD  Specialists: Cardiology: Dr.Harwani  Chief Complaint: Right leg pain and inability to ambulate  HPI: Kurt Cross is a 62 y.o. male  with known history of chronic systolic heart failure status post defibrillator placement last EF measured was in 2013 was 35-40%, on home oxygen, OSA noncompliant with CPAP, diabetes mellitus, hypertension and hyperlipidemia presented to the ER because right leg pain after sustaining a fall the night prior to admission.  Patient was recently discharged on 08/05/2013 after being hospitalized for community acquired pneumonia. Patient stated he took all of his antibiotics as prescribed. Patient states was using the bathroom the night prior to admission he bent over to get the brush of the floor his legs give out and he fell on his right side. Patient stated subsequently developed right lower extremity pain to the point he could not bear weight and his wife had to use a Hoyer lift to lift a month. Patient stated the nurse came by on the morning of admission he could not get out of bed or put weight on his leg and subsequently presented to the ED. Patient denies any syncope, no fever, no chills, no chest pain, no shortness of breath, no constipation, no melena, no hematemesis, no hematochezia. Patient did have an episode of loose stool however has been on stool softeners. Patient presented to the ED x-[place] of the right hip was negative. Patient states his right lower extremity pain is more behind his thigh and into his calf region. Basic metabolic profile done at his sodium of 133 chloride of 92 creatinine of 1.52 otherwise was within normal limits. CBC had a white count of 17.8 otherwise was within normal limits. We were called to admit the patient for further evaluation and probable placement.   Review of Systems: The  patient denies anorexia, fever, weight loss,, vision loss, decreased hearing, hoarseness, chest pain, syncope, dyspnea on exertion, peripheral edema, balance deficits, hemoptysis, abdominal pain, melena, hematochezia, severe indigestion/heartburn, hematuria, incontinence, genital sores, muscle weakness, suspicious skin lesions, transient blindness, difficulty walking, depression, unusual weight change, abnormal bleeding, enlarged lymph nodes, angioedema, and breast masses.   Past Medical History  Diagnosis Date  . Sleep apnea   . Diabetes mellitus   . Hypertension   . Hyperlipidemia   . CHF (congestive heart failure)   . Hernia    Past Surgical History  Procedure Laterality Date  . Appendectomy    . Tonsillectomy    . Hernia surgeries      multiple surgeries  . Nasal septum surgery     Social History:  reports that he quit smoking about a year ago. He has never used smokeless tobacco. He reports that he does not drink alcohol or use illicit drugs.  No Known Allergies  History reviewed. No pertinent family history.   Prior to Admission medications   Medication Sig Start Date End Date Taking? Authorizing Provider  carvedilol (COREG) 6.25 MG tablet Take 1 tablet (6.25 mg total) by mouth 2 (two) times daily with a meal. 08/27/12  Yes Ripudeep K Rai, MD  furosemide (LASIX) 80 MG tablet Take 1 tablet (80 mg total) by mouth 2 (two) times daily. 08/27/12  Yes Ripudeep Krystal Eaton, MD  insulin aspart (NOVOLOG) 100 UNIT/ML injection Inject 12 Units into the skin 2 (two) times daily.   Yes Historical Provider, MD  insulin regular (NOVOLIN R,HUMULIN  R) 100 units/mL injection Inject 40 Units into the skin 2 (two) times daily.   Yes Historical Provider, MD  levofloxacin (LEVAQUIN) 750 MG tablet Take 750 mg by mouth daily. 08/05/13  Yes Robbie Lis, MD  lisinopril (PRINIVIL,ZESTRIL) 10 MG tablet Take 1 tablet (10 mg total) by mouth 2 (two) times daily. 08/27/12  Yes Ripudeep Krystal Eaton, MD   oxyCODONE-acetaminophen (PERCOCET/ROXICET) 5-325 MG per tablet Take 1-2 tablets by mouth every 4 (four) hours as needed for moderate pain or severe pain. For  pain 08/05/13  Yes Robbie Lis, MD  rosuvastatin (CRESTOR) 20 MG tablet Take 20 mg by mouth every other day. 08/05/13  Yes Robbie Lis, MD   Physical Exam: Filed Vitals:   08/11/13 1316  BP: 113/72  Pulse: 74  Temp: 98.2 F (36.8 C)  Resp: 18     General:  Obese laying on gurney in no acute cardiopulmonary distress.  Eyes: Pupils equal and reactive to light and accommodation. Extraocular movements intact.  ENT: Oropharynx is clear, no lesions, no exudates.  Neck: Supple no lymphadenopathy.  Cardiovascular: Regular rate rhythm no murmurs rubs or gallops.  Respiratory: Clear to auscultation bilaterally. No wheezes or crackles no rhonchi.  Abdomen: Distended, abdominal hernia, scars noted, soft, positive bowel sounds, nontender.  Skin: No rash or lesions.  Musculoskeletal: No clubbing cyanosis or edema. Right posterior aspect of the thigh tender to palpation. Right calf region tender to palpation.  Psychiatric: Normal mood. Normal affect. Fair insight. Fair judgment.  Neurologic: Alert and oriented x3. Her nose 2 through 12 are grossly intact. No focal deficits. Gait not tested secondary to safety.  Labs on Admission:  Basic Metabolic Panel:  Recent Labs Lab 08/05/13 0530 08/11/13 1450  NA 133* 133*  K 4.0 4.3  CL 95* 92*  CO2 29 29  GLUCOSE 221* 233*  BUN 20 23  CREATININE 1.29 1.52*  CALCIUM 9.9 9.8   Liver Function Tests: No results found for this basename: AST, ALT, ALKPHOS, BILITOT, PROT, ALBUMIN,  in the last 168 hours No results found for this basename: LIPASE, AMYLASE,  in the last 168 hours No results found for this basename: AMMONIA,  in the last 168 hours CBC:  Recent Labs Lab 08/05/13 0530 08/11/13 1450  WBC 12.0* 17.8*  HGB 13.6 13.8  HCT 40.0 40.3  MCV 96.4 96.0  PLT 236 386    Cardiac Enzymes: No results found for this basename: CKTOTAL, CKMB, CKMBINDEX, TROPONINI,  in the last 168 hours  BNP (last 3 results)  Recent Labs  08/24/12 1050 08/27/12 0500 08/01/13 1649  PROBNP 1036.0* 253.4* 481.4*   CBG:  Recent Labs Lab 08/04/13 2129 08/05/13 0747 08/05/13 1218 08/11/13 1335  GLUCAP 176* 204* 211* 240*    Radiological Exams on Admission: Dg Hip Complete Right  08/11/2013   CLINICAL DATA:  Right posterior hip pain.  EXAM: RIGHT HIP - COMPLETE 2+ VIEW  COMPARISON:  None.  FINDINGS: There is no evidence of hip fracture or dislocation. There is no evidence of arthropathy or other focal bone abnormality.  IMPRESSION: No acute osseous injury of the right hip.   Electronically Signed   By: Kathreen Devoid   On: 08/11/2013 15:16    EKG: Independently reviewed. Atrial fibrillation.  Assessment/Plan Principal Problem:   Right leg pain Active Problems:   DM   HYPERLIPIDEMIA   OBSTRUCTIVE SLEEP APNEA   Hypertension   Unspecified constipation   Chronic combined systolic and diastolic CHF (congestive heart failure)  Leukocytosis   #1 right leg pain Likely musculoskeletal in nature. Patient with some hamstring tightness and tenderness to palpation and some calf tenderness to palpation. Will admit the patient. Warm compresses. Supportive care. Pain management. PT/OT. Will likely need placement.  #2 diabetes mellitus Will place patient on Lantus 45 units each bedtime. NovoLog 5 units 3 times a day with meals with no coverage. Sliding scale insulin.  #3 obstructive sleep apnea CPAP each bedtime.  #4 chronic combined systolic and diastolic CHF Stable. Continue home regimen Lasix.   #5 chronic kidney disease stage III Stable. Monitor.  #6 hyperlipidemia Continue statin.  #7 constipation Colace twice a day.  #8 leukocytosis Likely a reactive leukocytosis. Will check a UA with cultures and sensitivities. Patient was recently treated for  community-acquired pneumonia and finished antibiotic course one day prior to admission.  #9 atrial fibrillation Patient does not per chart have a history of atrial fibrillation however the EKG of 08/01/2013 patient was noted to be in A. fib/A. flutter. Patient's current EKG is atrial fibrillation however is currently rate controlled. 2-D echo from 06/29/2012 within DFO 35-40% with trivial aortic valvular regurgitation. Mildly dilated left atrium. Will repeat 2-D echo. Patient denies any chest pain. Will cycle cardiac enzymes. Will continue Coreg for rate control. Will place on aspirin for now as patient had presented with a fall. Patient will need to be reassessed per his cardiologist as to whether he is an anticoagulation candidate.   #10 prophylaxis Lovenox for DVT prophylaxis.    Code Status: Full Family Communication: updated patient and wife at bedside. Disposition Plan: Admit to Med surg  Time spent: 60 mins  [facility] Pager [phone]  If 7PM-7AM, please contact night-coverage [email or web address] Password [email or web address] 08/11/2013, 6:18 PM

## 2013-08-11 NOTE — ED Notes (Signed)
Per EMS: Pt from home.  C/o generalized weakness x 1 day with rt leg pain.  States that last night he went to the bathroom but his leg gave way and he fell. C/o rt leg pain.  Unsure if pain precipitated fall or if pain is from fall.  No neck, back pain.  VSS.

## 2013-08-12 ENCOUNTER — Encounter (HOSPITAL_COMMUNITY): Payer: Self-pay | Admitting: Internal Medicine

## 2013-08-12 DIAGNOSIS — I712 Thoracic aortic aneurysm, without rupture, unspecified: Secondary | ICD-10-CM | POA: Diagnosis present

## 2013-08-12 DIAGNOSIS — I4891 Unspecified atrial fibrillation: Secondary | ICD-10-CM

## 2013-08-12 DIAGNOSIS — I714 Abdominal aortic aneurysm, without rupture, unspecified: Secondary | ICD-10-CM

## 2013-08-12 HISTORY — DX: Abdominal aortic aneurysm, without rupture, unspecified: I71.40

## 2013-08-12 HISTORY — DX: Abdominal aortic aneurysm, without rupture: I71.4

## 2013-08-12 LAB — GLUCOSE, CAPILLARY
Glucose-Capillary: 188 mg/dL — ABNORMAL HIGH (ref 70–99)
Glucose-Capillary: 329 mg/dL — ABNORMAL HIGH (ref 70–99)

## 2013-08-12 LAB — BASIC METABOLIC PANEL
CO2: 27 mEq/L (ref 19–32)
Chloride: 91 mEq/L — ABNORMAL LOW (ref 96–112)
Creatinine, Ser: 1.41 mg/dL — ABNORMAL HIGH (ref 0.50–1.35)
GFR calc non Af Amer: 53 mL/min — ABNORMAL LOW (ref >90)
Sodium: 132 mEq/L — ABNORMAL LOW (ref 135–145)

## 2013-08-12 LAB — CBC
HCT: 41 % (ref 39.0–52.0)
Hemoglobin: 14 g/dL (ref 13.0–17.0)
MCV: 96 fL (ref 78.0–100.0)
RBC: 4.27 MIL/uL (ref 4.22–5.81)
WBC: 18.8 10*3/uL — ABNORMAL HIGH (ref 4.0–10.5)

## 2013-08-12 LAB — TROPONIN I: Troponin I: <0.3 ng/mL (ref <0.30)

## 2013-08-12 MED ORDER — INSULIN GLARGINE 100 UNIT/ML ~~LOC~~ SOLN
50.0000 [IU] | Freq: Every day | SUBCUTANEOUS | Status: DC
  Administered 2013-08-12 – 2013-08-13 (×2): 50 [IU] via SUBCUTANEOUS
  Filled 2013-08-12 (×3): qty 0.5

## 2013-08-12 MED ORDER — INSULIN ASPART 100 UNIT/ML ~~LOC~~ SOLN
8.0000 [IU] | Freq: Three times a day (TID) | SUBCUTANEOUS | Status: DC
  Administered 2013-08-13 – 2013-08-14 (×4): 8 [IU] via SUBCUTANEOUS

## 2013-08-12 MED ORDER — ASPIRIN 81 MG PO CHEW
81.0000 mg | CHEWABLE_TABLET | Freq: Every day | ORAL | Status: DC
  Administered 2013-08-12 – 2013-08-13 (×2): 81 mg via ORAL
  Filled 2013-08-12 (×2): qty 1

## 2013-08-12 NOTE — Evaluation (Addendum)
Occupational Therapy Evaluation Patient Details Name: Kurt Cross MRN: [id number] DOB: [birth year] Today's Date: 08/12/2013 Time: [id number] OT Time Calculation (min): 15 min  OT Assessment / Plan / Recommendation History of present illness Kurt Cross is a 62 y.o. male  with known history of chronic systolic heart failure status post defibrillator placement last EF measured was in 2013 was 35-40%, on home oxygen, OSA noncompliant with CPAP, diabetes mellitus, hypertension and hyperlipidemia presented to the ER because right leg pain after sustaining a fall the night prior to admission.     Clinical Impression   Pt presents with R LE pain which limits ADL ability. Will benefit from skilled OT services to maximize ADL independence for next venue of care.    OT Assessment  Patient needs continued OT Services    Follow Up Recommendations  SNF;Supervision/Assistance - 24 hour    Barriers to Discharge      Equipment Recommendations  None recommended by OT    Recommendations for Other Services    Frequency  Min 2X/week    Precautions / Restrictions Precautions Precautions: Fall Restrictions Weight Bearing Restrictions: No   Pertinent Vitals/Pain 7/10 R LE; reposition.    ADL  Eating/Feeding: Simulated;Independent Where Assessed - Eating/Feeding: Chair Grooming: Simulated;Wash/dry hands;Set up Where Assessed - Grooming: Supported sitting Upper Body Bathing: Simulated;Chest;Right arm;Left arm;Abdomen;Set up;Supervision/safety Where Assessed - Upper Body Bathing: Unsupported sitting Lower Body Bathing: Simulated;Maximal assistance Where Assessed - Lower Body Bathing: Supported sit to stand Upper Body Dressing: Simulated;Minimal assistance Where Assessed - Upper Body Dressing: Unsupported sitting Lower Body Dressing: Simulated;+1 Total assistance Where Assessed - Lower Body Dressing: Supported sit to stand Toilet Transfer: Simulated;Moderate assistance Toilet Transfer: Patient  Percentage: 60% Toilet Transfer Method: Sit to stand Toileting - Clothing Manipulation and Hygiene: Simulated;+1 Total assistance Where Assessed - Toileting Clothing Manipulation and Hygiene: Sit to stand from 3-in-1 or toilet Equipment Used: Rolling walker (bariatric\) ADL Comments: Stood from chair with bariatric walker and was able to tolerate standing for about a minute 15 sec.    OT Diagnosis: Generalized weakness;Acute pain  OT Problem List: Decreased strength;Decreased knowledge of use of DME or AE;Pain OT Treatment Interventions: Self-care/ADL training;DME and/or AE instruction;Therapeutic activities;Patient/family education   OT Goals(Current goals can be found in the care plan section) Acute Rehab OT Goals Patient Stated Goal: to be able to walk OT Goal Formulation: With patient Time For Goal Achievement: 08/26/13 Potential to Achieve Goals: Good ADL Goals Pt Will Transfer to Toilet: with min assist;ambulating;bedside commode Pt Will Perform Toileting - Clothing Manipulation and hygiene: with mod assist;sit to/from stand Additional ADL Goal #1: Pt will tolerate standing for 3 minutes for functional ADL Additional ADL Goal #2: pt will verbalize or demo AE use if he decides to obtain for LB ADL  Visit Information  Last OT Received On: 08/12/13 Assistance Needed: +2 PT/OT Co-Evaluation/Treatment: Yes History of Present Illness: Kurt Cross is a 62 y.o. male  with known history of chronic systolic heart failure status post defibrillator placement last EF measured was in 2013 was 35-40%, on home oxygen, OSA noncompliant with CPAP, diabetes mellitus, hypertension and hyperlipidemia presented to the ER because right leg pain after sustaining a fall the night prior to admission.         Prior [place] expects to be discharged to:: Private residence Living Arrangements: Spouse/significant other Available Help at Discharge: Family;Available 24  hours/day Type of Home: House Home Access: Ramped entrance Home Layout:  One level Home Equipment: [place] - 2 wheels;Cane - single point Prior Function Level of Independence: Needs assistance Gait / Transfers Assistance Needed: walked with cane or RW prior to fall on Sunday, then unable to walk 2* RLE pain ADL's / Homemaking Assistance Needed: wife assisted with sponge bath, socks and pt used cane to "hook" pants around feet Communication Communication: No difficulties         Vision/Perception     Cognition  Cognition Arousal/Alertness: Awake/alert Behavior During Therapy: WFL for tasks assessed/performed Overall Cognitive Status: Within Functional Limits for tasks assessed       Mobility Bed Mobility Bed Mobility: Sit to Supine Sit to Supine: 3: Mod assist Details for Bed Mobility Assistance: assist for BLEs into bed Transfers Transfers: Sit to Stand;Stand to Sit Sit to Stand: 3: Mod assist;With upper extremity assist;From chair/3-in-1 Sit to Stand: Patient Percentage: 60% Stand to Sit: 3: Mod assist;With upper extremity assist;To bed;To chair/3-in-1 Stand to Sit: Patient Percentage: 60%       Balance     End of Session OT - End of Session Equipment Utilized During Treatment: Rolling walker Activity Tolerance: Patient limited by pain Patient left: with call bell/phone within reach;in chair  GO Functional Assessment Tool Used: clinical judgement Functional Limitation: Self care Self Care Current Status [id number]): At least 60 percent but less than 80 percent impaired, limited or restricted Self Care Goal Status [id number]): At least 20 percent but less than 40 percent impaired, limited or restricted   Jules Schick [id number] 08/12/2013, 12:50 PM

## 2013-08-12 NOTE — Progress Notes (Signed)
Nutrition Brief Note  Patient identified on the Malnutrition Screening Tool (MST) Report (innacurate)  Wt Readings from Last 15 Encounters:  08/12/13 344 lb 12.8 oz (156.4 kg)  08/05/13 351 lb 6.6 oz (159.4 kg)  08/27/12 343 lb 7.6 oz (155.8 kg)  06/29/12 341 lb 12.8 oz (155.039 kg)  04/28/08 333 lb 2.1 oz (151.107 kg)    Body mass index is 46.75 kg/(m^2). Patient meets criteria for Morbid Obesity based on current BMI. Weight has been stable over the past year. RD met with pt at previous admission and provided weight loss and diabetes diet education.   Current diet order is Heart Healthy, patient is consuming approximately 100% of meals at this time. Labs and medications reviewed.   No nutrition interventions warranted at this time. If nutrition issues arise, please consult RD.   Pryor Ochoa RD, LDN Inpatient Clinical Dietitian Pager: [phone] After Hours Pager: [phone]

## 2013-08-12 NOTE — Progress Notes (Addendum)
Inpatient Diabetes Program Recommendations  AACE/ADA: New Consensus Statement on Inpatient Glycemic Control (2013)  Target Ranges:  Prepandial:   less than 140 mg/dL      Peak postprandial:   less than 180 mg/dL (1-2 hours)      Critically ill patients:  140 - 180 mg/dL   Reason for Visit: Hyperglycemia  Results for SCOT, ENSOR (MRN [id number]) as of 08/12/2013 12:01  Ref. Range 08/11/2013 13:35 08/11/2013 21:31 08/12/2013 07:14 08/12/2013 11:52  Glucose-Capillary Latest Range: 70-99 mg/dL 240 (H) 269 (H) 272 (H) 188 (H)    Inpatient Diabetes Program Recommendations Insulin - Basal: Please consider increasing Lantus to 50 units QHS Insulin - Meal Coverage: Please consider increasing Novolog meal coverage to 8 units tidwc with meals Add CHO mod medium diet.  Note: Will follow while inpatient. Thank you. Lorenda Peck, RD, LDN, CDE Inpatient Diabetes Coordinator [phone]

## 2013-08-12 NOTE — Progress Notes (Addendum)
Physical Therapy Treatment Patient Details Name: Kurt Cross MRN: [id number] DOB: 08/18/1951 Today's Date: 08/12/2013 Time: [id number] PT Time Calculation (min): 18 min  PT Assessment / Plan / Recommendation  History of Present Illness Kurt Cross is a 62 y.o. male  with known history of chronic systolic heart failure status post defibrillator placement last EF measured was in 2013 was 35-40%, on home oxygen, OSA noncompliant with CPAP, diabetes mellitus, hypertension and hyperlipidemia presented to the ER because right leg pain after sustaining a fall the night prior to admission.     PT Comments   **Assisted pt to bed for echocardiogram. Improved standing tolerance with bariatric RW. Pt able to bear a little weight thru RLE, but still limited by pain.*  Follow Up Recommendations  SNF     Does the patient have the potential to tolerate intense rehabilitation     Barriers to Discharge        Equipment Recommendations  None recommended by PT    Recommendations for Other Services    Frequency Min 3X/week   Progress towards PT Goals    Plan Current plan remains appropriate    Precautions / Restrictions Precautions Precautions: Fall Restrictions Weight Bearing Restrictions: No   Pertinent Vitals/Pain * 4/10 RLE premedicated**    Mobility  Bed Mobility Bed Mobility: Sit to Supine Sit to Supine: 3: Mod assist Details for Bed Mobility Assistance: assist for BLEs into bed Transfers Transfers: Sit to Stand;Stand to Sit;Stand Pivot Transfers Sit to Stand: 3: Mod assist Sit to Stand: Patient Percentage: 60% Stand to Sit: 3: Mod assist Stand to Sit: Patient Percentage: 60% Stand Pivot Transfers: 1: +2 Total assist Stand Pivot Transfers: Patient Percentage: 60% Details for Transfer Assistance: increased time for sit to stand, SPT with bariatric RW and decreased WB on RLE 2* pain, VCs for hand placement Ambulation/Gait Ambulation/Gait Assistance: Not tested (comment)         PT Diagnosis: Difficulty walking;Other (comment);Acute pain (decreased endurance.)  PT Problem List: Decreased activity tolerance;Decreased balance;Decreased mobility;Decreased knowledge of use of DME;Pain;Decreased strength;Obesity PT Treatment Interventions: DME instruction;Gait training;Functional mobility training;Therapeutic activities;Therapeutic exercise;Balance training;Neuromuscular re-education;Patient/family education   PT Goals (current goals can now be found in the care plan section) Acute Rehab PT Goals Patient Stated Goal: to be able to walk PT Goal Formulation: With patient Time For Goal Achievement: 08/18/13 Potential to Achieve Goals: Good  Visit Information  Last PT Received On: 08/12/13 Assistance Needed: +2 PT/OT Co-Evaluation/Treatment: Yes History of Present Illness: Kurt Cross is a 62 y.o. male  with known history of chronic systolic heart failure status post defibrillator placement last EF measured was in 2013 was 35-40%, on home oxygen, OSA noncompliant with CPAP, diabetes mellitus, hypertension and hyperlipidemia presented to the ER because right leg pain after sustaining a fall the night prior to admission.      Subjective Data  Patient Stated Goal: to be able to walk   Cognition  Cognition Arousal/Alertness: Awake/alert Behavior During Therapy: WFL for tasks assessed/performed Overall Cognitive Status: Within Functional Limits for tasks assessed    Balance     End of Session PT - End of Session Equipment Utilized During Treatment: Oxygen Activity Tolerance: Patient tolerated treatment well Patient left: with call bell/phone within reach;in bed Nurse Communication: Mobility status   GP     Kurt Cross 08/12/2013, 11:19 AM [phone]

## 2013-08-12 NOTE — Progress Notes (Signed)
  Echocardiogram 2D Echocardiogram has been performed.  Basilia Jumbo 08/12/2013, 12:21 PM

## 2013-08-12 NOTE — Progress Notes (Signed)
[facility]  Patient Status: Active (receiving services up to time of hospitalization)  [facility] is providing the following services: RN and PT.  Pt has not been seen yet by PT services.    If patient discharges after hours, please call [phone].   Lurlean Leyden 08/12/2013, 9:23 AM

## 2013-08-12 NOTE — Progress Notes (Signed)
Kurt Cross recently picked up by [facility] from last admission. [facility] has been in communication with patient prior to admission after last hospital discharge. Noted recommendation is for SNF at this discharge. Went to bedside to speak with patient. However, he was about to have a bedside echo done. Spoke with inpatient RNCM to make aware [name] following. Will continue to follow along.  Marthenia Rolling, MSN-Ed, RN,BSN- [facility] [phone]

## 2013-08-12 NOTE — Progress Notes (Signed)
[facility] PROGRESS NOTE  Kurt Cross [id number] DOB: [birth year] DOA: 08/11/2013 PCP: [name], MD  Assessment/Plan: #1 right lower extremity pain Secondary to fall. Pain is more in the posterior thigh/hamstrings and posterior Likely musculoskeletal in nature. Patient with some clinical improvement. Continue warm compresses. PT/OT. Follow.  #2 atrial fibrillation Patient noted on EKG on admission to be in atrial fibrillation. Patient currently in sinus rhythm. Cardiac enzymes have been negative x3. 2-D echo is pending. Continue Coreg for rate control. Patient has a CHADS2 score of 3. Patient with recent fall and a CT scan to the chest during last hospitalization showed a 4.5 cm ascending aortic aneurysm. Due to patient's fall will place on aspirin daily. Will have patient followup with cardiology as outpatient for further evaluation for anticoagulation therapy.  #3 leukocytosis Questionable etiology. Patient is afebrile. Patient recently treated for pneumonia and finished antibiotic course one day prior to admission. Patient currently with no respiratory symptoms. Urinalysis is negative. Patient does not have any diarrhea. Will hold off on antibiotics at this time and monitor.  #4 chronic combined systolic and diastolic CHF Stable. Continue home regimen of Lasix.  #5 chronic kidney disease stage III Stable.  #6 hyperlipidemia Continue statin.  #7 4.5 cm ascending aortic aneurysm per CT scan of 08/01/2013 Stable. Asymptomatic. Continue risk factor modification with good blood pressure control. Will need outpatient followup with vascular surgery as outpatient.  #8 hypertension Well-controlled. Continue Coreg, Lasix, lisinopril.  #9 type 2 diabetes Hemoglobin A1c was 9.5 on 08/02/2013. CBGs have ranged from 188-272. Increase Lantus to 50 units daily. Increase meal coverage NovoLog to 8 units 3 times daily.  #10 prophylaxis Lovenox for DVT prophylaxis.   Code Status:  Full Family Communication: Updated patient at bedside no family present. Disposition Plan: SNF when medically stable.   Consultants:  None  Procedures:  2-D echo pending 08/12/2013  X-Kunz of the right hip 08/11/2013  Antibiotics:  None  HPI/Subjective: Patient states leg pain improved.  Objective: Filed Vitals:   08/12/13 0631  BP: 137/54  Pulse: 88  Temp: 99.5 F (37.5 C)  Resp: 22    Intake/Output Summary (Last 24 hours) at 08/12/13 1109 Last data filed at 08/12/13 0958  Gross per 24 hour  Intake    363 ml  Output    625 ml  Net   -262 ml   Filed Weights   08/11/13 1954 08/12/13 0631  Weight: 154.5 kg (340 lb 9.8 oz) 156.4 kg (344 lb 12.8 oz)    Exam:   General:  NAD  Cardiovascular: RRR  Respiratory: CTAB  Abdomen: Soft/NT/ND/+BS  Musculoskeletal: No c/c/e  Data Reviewed: Basic Metabolic Panel:  Recent Labs Lab 08/11/13 1450 08/12/13 0655  NA 133* 132*  K 4.3 4.2  CL 92* 91*  CO2 29 27  GLUCOSE 233* 272*  BUN 23 23  CREATININE 1.52* 1.41*  CALCIUM 9.8 10.1   Liver Function Tests: No results found for this basename: AST, ALT, ALKPHOS, BILITOT, PROT, ALBUMIN,  in the last 168 hours No results found for this basename: LIPASE, AMYLASE,  in the last 168 hours No results found for this basename: AMMONIA,  in the last 168 hours CBC:  Recent Labs Lab 08/11/13 1450 08/12/13 0655  WBC 17.8* 18.8*  HGB 13.8 14.0  HCT 40.3 41.0  MCV 96.0 96.0  PLT 386 394   Cardiac Enzymes:  Recent Labs Lab 08/11/13 1954 08/12/13 0050 08/12/13 0655  TROPONINI <0.30 <0.30 <0.30   BNP (last 3  results)  Recent Labs  08/24/12 1050 08/27/12 0500 08/01/13 1649  PROBNP 1036.0* 253.4* 481.4*   CBG:  Recent Labs Lab 08/05/13 1218 08/11/13 1335 08/11/13 2131 08/12/13 0714  GLUCAP 211* 240* 269* 272*    No results found for this or any previous visit (from the past 240 hour(s)).   Studies: Dg Hip Complete Right  08/11/2013    CLINICAL DATA:  Right posterior hip pain.  EXAM: RIGHT HIP - COMPLETE 2+ VIEW  COMPARISON:  None.  FINDINGS: There is no evidence of hip fracture or dislocation. There is no evidence of arthropathy or other focal bone abnormality.  IMPRESSION: No acute osseous injury of the right hip.   Electronically Signed   By: Kathreen Devoid   On: 08/11/2013 15:16    Scheduled Meds: . Derrill Memo ON 08/13/2013] atorvastatin  40 mg Oral QODAY  . carvedilol  6.25 mg Oral BID WC  . docusate sodium  100 mg Oral BID  . enoxaparin (LOVENOX) injection  80 mg Subcutaneous Q24H  . furosemide  80 mg Oral BID WC  . insulin aspart  0-20 Units Subcutaneous TID WC  . insulin aspart  6 Units Subcutaneous TID WC  . insulin glargine  45 Units Subcutaneous QHS  . lisinopril  10 mg Oral BID  . sodium chloride  3 mL Intravenous Q12H   Continuous Infusions:   Principal Problem:   Right leg pain Active Problems:   DM   HYPERLIPIDEMIA   OBSTRUCTIVE SLEEP APNEA   Hypertension   Unspecified constipation   Chronic combined systolic and diastolic CHF (congestive heart failure)   Leukocytosis    Time spent: 35 mins    [facility] Pager [phone]. If 7PM-7AM, please contact night-coverage at [email or web address], password [facility] 08/12/2013, 11:09 AM  LOS: 1 day

## 2013-08-12 NOTE — Progress Notes (Signed)
Placed pt on auto CPAP mode 5-20cmH2O with 1 lpm bled in. RN notified. Pt is wearing full face mask as he wears at home. Sterile water added to humidity chamber.

## 2013-08-12 NOTE — Evaluation (Signed)
Physical Therapy Evaluation Patient Details Name: Kurt Cross MRN: [id number] DOB: [birth year] Today's Date: 08/12/2013 Time: [id number] PT Time Calculation (min): 17 min  PT Assessment / Plan / Recommendation History of Present Illness  Kurt Cross is a 62 y.o. male  with known history of chronic systolic heart failure status post defibrillator placement last EF measured was in 2013 was 35-40%, on home oxygen, OSA noncompliant with CPAP, diabetes mellitus, hypertension and hyperlipidemia presented to the ER because right leg pain after sustaining a fall the night prior to admission.    Clinical Impression  *Pt admitted with RLE pain after a fall**. Pt currently with functional limitations due to the deficits listed below (see PT Problem List).  Pt will benefit from skilled PT to increase their independence and safety with mobility to allow discharge to the venue listed below.   **    PT Assessment  Patient needs continued PT services    Follow Up Recommendations  SNF    Does the patient have the potential to tolerate intense rehabilitation      Barriers to Discharge        Equipment Recommendations  None recommended by PT    Recommendations for Other Services     Frequency Min 3X/week    Precautions / Restrictions Precautions Precautions: Fall Restrictions Weight Bearing Restrictions: No   Pertinent Vitals/Pain *10/10 pain RLE at rest Repositioned, RLE elevated, RN notified**      Mobility  Bed Mobility Bed Mobility: Not assessed Transfers Transfers: Sit to Stand;Stand to Sit Sit to Stand: 3: Mod assist;From chair/3-in-1;With armrests;With upper extremity assist Sit to Stand: Patient Percentage: 60% Stand to Sit: 3: Mod assist;To chair/3-in-1;With upper extremity assist Details for Transfer Assistance: uncontrolled descent to recliner, increased time for sit to stand, unable to tolerate standing more than 15 seconds 2* RLE pain, trunk flexed in  standing Ambulation/Gait Ambulation/Gait Assistance: Not tested (comment)    Exercises General Exercises - Lower Extremity Ankle Circles/Pumps: AROM;Both;10 reps;Supine Quad Sets: AROM;Right;5 reps;Supine Heel Slides: AAROM;Right;10 reps;Supine Hip ABduction/ADduction: AAROM;Right;10 reps;Supine   PT Diagnosis: Difficulty walking;Other (comment);Acute pain (decreased endurance.)  PT Problem List: Decreased activity tolerance;Decreased balance;Decreased mobility;Decreased knowledge of use of DME;Pain;Decreased strength;Obesity PT Treatment Interventions: DME instruction;Gait training;Functional mobility training;Therapeutic activities;Therapeutic exercise;Balance training;Neuromuscular re-education;Patient/family education     PT Goals(Current goals can be found in the care plan section) Acute Rehab PT Goals Patient Stated Goal: to be able to walk PT Goal Formulation: With patient Time For Goal Achievement: 08/18/13 Potential to Achieve Goals: Good  Visit Information  Last PT Received On: 08/12/13 Assistance Needed: +2 History of Present Illness: Kurt Cross is a 62 y.o. male  with known history of chronic systolic heart failure status post defibrillator placement last EF measured was in 2013 was 35-40%, on home oxygen, OSA noncompliant with CPAP, diabetes mellitus, hypertension and hyperlipidemia presented to the ER because right leg pain after sustaining a fall the night prior to admission.         Prior [place] expects to be discharged to:: Private residence Living Arrangements: Spouse/significant other Available Help at Discharge: Family;Available 24 hours/day Type of Home: House Home Access: Ramped entrance Home Layout: One level Home Equipment: Walker - 2 wheels;Cane - single point Prior Function Level of Independence: Needs assistance Gait / Transfers Assistance Needed: walked with cane or RW prior to fall on Sunday, then unable to walk  2* RLE pain ADL's / Homemaking Assistance Needed: wife assisted with sponge bath  Communication Communication: No difficulties    Cognition  Cognition Arousal/Alertness: Awake/alert Behavior During Therapy: WFL for tasks assessed/performed Overall Cognitive Status: Within Functional Limits for tasks assessed    Extremity/Trunk Assessment Upper Extremity Assessment Upper Extremity Assessment: Overall WFL for tasks assessed Lower Extremity Assessment Lower Extremity Assessment: RLE deficits/detail RLE Deficits / Details: knee extension AROM -35*, full knee extension in supine, generalized weakness 2* pain, able to actively DF/PF ankle RLE: Unable to fully assess due to pain   Balance    End of Session PT - End of Session Equipment Utilized During Treatment: Oxygen Activity Tolerance: Patient tolerated treatment well Patient left: in chair;with call bell/phone within reach Nurse Communication: Mobility status  GP     Blondell Reveal Kistler 08/12/2013, 10:23 AM [phone]

## 2013-08-12 NOTE — Care Management Note (Addendum)
Page 1 of 2   10/14/2013     11:14:31 AM   CARE MANAGEMENT NOTE 10/14/2013  Patient:  Kurt Cross,Kurt Cross   Account Number:  [id number]  Date Initiated:  08/12/2013  Documentation initiated by:  [facility]  Subjective/Objective Assessment:   62 Y/O M ADMITTED W/R LEG PAIN,WEAKNESS.PRIOR ADMISSION-10/26-10/30/14-RESP FAILURE.     Action/Plan:   FROM HOME W/SPOUSE.ACTIVE [facility] HHRN/PT.ACTIVE W/THN.HAS PCP,PHARMACY.   Anticipated DC Date:  10/16/2013   Anticipated DC Plan:  [place]  In-house referral  Clinical Social Worker      DC Forensic scientist  CM consult      [facility] Choice  [place]   Choice offered to / List presented to:  C-1 Patient        [place] arranged  HH-1 RN  [place]      [place].   Status of service:  Completed, signed off Medicare Important Message given?  NA - LOS <3 / Initial given by admissions (If response is "NO", the following Medicare IM given date fields will be blank) Date Medicare IM given:   Date Additional Medicare IM given:  09/10/2013  Discharge Disposition:  [place]  Per UR Regulation:  Reviewed for med. necessity/level of care/duration of stay  If discussed at [place] of Stay Meetings, dates discussed:   08/19/2013  08/24/2013  08/26/2013  08/31/2013  09/07/2013  09/09/2013  09/14/2013  09/16/2013  09/21/2013  09/23/2013  09/28/2013    Comments:  10-14-13 Sunday Spillers RN CM 1100 Spoke with patient at bedside, suction has been d/ced from wound x 2 days. Discussed plans for d/c, initially wanted local SNF for rehab but has progressed and now wants to d/c home. Will continue to need TNA for an extended period, wound has been pouched. Patient states wife able to manage him at home, they have ordered Cross lift chair to assist at d/c. Likely he will need RN, PT/OT, aide, awaiting final  orders, discussed with [facility] per patient's choice and they will start teaching with wife regarding TNA.  09/27/13 KATHY MAHABIR RN,BSN NCM [place] CARE.TNA@125 ,BOWEL REST,ABD WOUND CONTINUOUS WALL SUCTION,WOUND CARE-DSG CHANGE,EAKIN POUCH.SX,WOC FOLLOWING.  09/23/13 KATHY MAHABIR RN,BSN NCM [place] [facility](LTAC).COPY IN CHART,COPY GIVEN TO PATIENT(TC SPOUSE SINCE PATIENT WANTS SPOUSE NOTIFIED)INFORMED HER OF DENIAL FOR LTAC-[facility]-OON,NO OON BENEFITS.HOSPITAL PROVIDING LTAC LEVEL OF CARE.WE ARE STILL PURSUING ?SNF IF ABLE TO MANAGE BOTH TNA,& ABD WALL CONTINUOUS SUCTION.PATIENT/SPOUSE VOICED UNDERSTANDING.NOTED WOC NOTES,& RECOMMENDATIONS.SX FOLLOWING.D/C ORDER SNF WHEN BED ABVAILABLE TO STAY IN PLACE.  09/22/13 KATHY MAHABIR RN,BSN NCM [address] TEL#[phone] INFORMED THAT [facility] HAS BEEN DENIED LTAC SINCE PATIENT DOES NOT HAVE OON BENEFIT, & THEIR IS AN LTAC IN [place] THAT IS IN NETWORK.MD COULD DO Cross PEER TO PEER W/INSURANCE DOCTOR.MD NOTIFIED.INFORMED PATIENT/SPOUSE-VIA PHONE OF THIS DECISION.[place] LEVEL OF CARE.TNA @125 /ABD WALL LOW CONTINUOUS SUCTION.  09/20/13 KATHY MAHABIR RN,BSN NCM [phone] SPOKE TO PATIENT/SPOUSE(LEFT VM)INFORMED OF CURRENT STATUS AFTER CLARIFICATION OF ABD ABSCESS- WALL LOW CONTINUOUS SUCTION.STILL ON TNA-PICC/BOWEL REST/NPO/WOUND CARE-EAKIN POUCH-WOUND GETTING SMALLER.LTAC LEVEL OF CARE,NO SAFE D/C SINCE [facility] [place] IS TOO FAR PER PATIENT,FAMILY IS SEEKING LTAC BY [facility](INDEPENDENTLY) TO INSURANCE CO,[facility] REP IS FOLLOWING AWAITING DECISION.SINCE ON LONGTERM TNA,& ABD  WALL LOW CONTINUOUS SUCTION CURRENTLY NEEDED-CAN ONLY BE PROVIDED AT LTAC LEVEL.  09/17/13 KATHY MAHABIR RN,BSN NCM [phone] SPOKE TO PATIENT,& SPOUSE(PHONE) INFORMED BOTH OF CURRENT STATUS:AWAITING RESPONSE FROM INSURANCE CO OF [facility] REQUESTING  AUTH FOR LTAC(INDEPENDENTLY THROUGH PATIENT & [facility]) BOTH VOICED UNDERSTANDING.ABD WALL CONTINUOUS SUCTION/TNA-PICC/BOWEL REST/WOUND CARE-EAKIN POUCH.  09/16/13 KATHY MAHABIR RN,BSN NCM [place] [employer] CM WHO RECEIVED [facility] REQUEST FOR AUTH, THEY ARE PROCESSING & WILL GET BACK TO ME ABOUT STATUS FROM MED DIRECTOR.SPOKE TO SPOUSE/PATIENT & INFORMED THAT WE ARE WAITING ON Cross RESPONSE FROM [employer] IF [facility] WILL RECEIVE [place]. PER [facility]-LTACH(INDEPENDENTLY THROUGH PATIENT REQUEST SUBMITTED INFO FOR AUTH)[facility] REP STATES HE RECEIVED Cross CALL FROM INSURANCE THAT THEY HAVE RECEIVED HIS INFO & ARE FOLLOWING.STILL AWAITING RESPONSE.NO CURRENT SAFE D/C PLAN.TNA/BOWEL REST/ABD WALL CONTINUOUS SUCTION.UPDATED PATIENT/SPOUSE.  09/14/13 KATHY MAHABIR RN,BSN NCM [place] WHILE INSURANCE EXPLORING LOCALLY-(THIS IS DONE INDEPENDENTLY BY PATIENT-LTACH @ [facility]) OR SNF'S THAT CAN MANAGE ABD WALL CONTINUOUS SUCTION, & TNA.TC [facility](LT[facility])-INFORMED OF OUTCOME-WE WILL NOT EXPLORE THIS LTACH SINCE IT IS TOO FAR FOR PATIENT.PATIENT/SPOUSE INFORMED OF OUTCOME-PLEASED,& VERY APPRECIATIVE.MD/NURSE UPDATED.  09/13/13 KATHY MAHABIR RN,BSN NCM [place] Cross BED AVAILABLE,& CAN ACCEPT PATIENT TOMORROW BY 1P.RECEIVING MD DR. HILL,RM 301,NURSE CALL REPORT [phone].SPOKE TO PATIENT IN RM WHILE MD,& NURSE PRESENT- EXPLAINED THAT HE HAS Cross BED @ FACILITY.HE SAYS HE IS NOT GOING TO [place].INFORMED HIM THAT IF HE CHOOSES NOT TO GO HE WILL PAY FOR HIS HOSPITAL STAY, & HIS INSURANCE WILL NOT PAY FOR HIS HOSPITAL STAY.PATIENT VOICED UNDERSTANDING. 2:15P WAITING TO TALK TO SPOUSE SINCE SHE SAID SHE WAS VERY BUSY & HAD TO GO.FINALLY SPOKE TO HER OUTSIDE OF RM WHILE IN ROUTE TO ELEVATOR SINCE SHE SAID SHE HAD TO GO.INFORMED HER OF [facility]  [place] SUBMITTED ALL INFO,& THEY MAY HAVE Cross BED AVAILABLE AS SOON AS TOMORROW.IF THEY CHOOSE TO STAY IN HOSPITAL THEY WILL HAVE TO PAY FOR THEIR STAY.ALSO INFORMED THAT PURSUING [facility] LTACH IS AN INDEPENDENT PROCESS BETWEEN PATIENT, & INSURANCE COMPANY.SHE VOICED UNDERSTANDING.PROVIDED MY SUPPORT.EXPLAINED THE SAME TO PATIENT,HE SAID IF YOU TOLD MY WIFE THEN THAT IS GOOD FOR ME.BUT I STILL SAID I HAD TO EXPLAIN IT TO HIM ALSO.EXPLAINED THE SAME.HE VOICED UNDERSTANDING. PATIENT INFORMED ABOUT MEDICARE'S DECISION THAT THEY ARE UNABLE TO APPEAL SINCE THERE IS AN LTACH LEVEL OF CARE FOR D/C.PATIENT VOICED UNDERSTANDING,& GAVE PERMISSION FOR ME TO CONTACT HIS SPOUSE SUS[place] TEL#[phone],EXPLAINED TO HER THAT WE RECEIVED Cross CALL FROM MEDICARE & WANTED TO KNOW IF SHE ALSO RECEIVED Cross CALL,SHE SAID YES & UNDERSTOOD WHAT THEY TOLD HER WHICH WAS THE SAME INFO-THEY ARE UNABLE TO APPEAL SINCE THERE IS AN LTACH LEVEL OF CARE AVAILABLE-AN ACUTE TO ACUTE.SHE THEN SAID SHE HAD TO GO & TAKE CARE OF HER PATIENT IN THE HOME.HAVE FAXED W/CONFIRMATION UPDATED CLINICALS TO [place] TEL#[phone].ALSO SPOKE TO [employer] REP  09/11/13 KATHY MAHABIR RN,BSN UR NURSE [phone] RECEIVED CALL FROM MEDICARE QIO ON APPEAL SUBMITTED.PATIENT UNABLE TO APPEAL WITH AN LTACH-ACUTE TO ACUTE LEVEL OF CARE FOR D/C.NO SAFE D/C OVER WEEKEND SINCE [facility] [place] HAS NO BEDS AVAILABLE.WILL DISCUSS W/PATIENT ON MONDAY.MD/CM UPDATED.  09/10/13 KATHY MAHABIR RN,BSN NCM [address] [place] CALL FROM MEDICARE QIO FOR APPEAL PROCESS.PATIENT/SPOUSE AWARE.PATIENT AWARE THAT WE WILL WAIT UNTIL Cross RESPONSE RECEIVED.DETAILED NOTICE IN CHART,& PATIENT HAS  Cross COPY. SPOKE TO PATIENT/SPOUSE(ON PHONE) ABOUT MEDICARE IM NOTICE,PROVIDED VERBAL TO SPOUSE SINCE SHE WAS UNABLE TO COME TO HOSPITAL UNTIL LATER,PATIENT IN AGREEMENT.VOICED UNDERSTANDING OF IM NOTICE,& APPEAL PROCESS.[facility] HAS NO BEDS AVAILABLE  TODAY, OR MONDAY, WILL CONTINUE TO UPDATE THEM-SPOKE TO MELISSA.[facility]-LTACH HAS ALREADY SUBMITTED CLINICALS TO INSURANCE CO.AWAITING RESPONSE.HHC NOT AN OPTION SINCE UNABLE TO PROVIDE ABD WALL CONTINUOUS SUCTION.NO SAFE D/C TODAY.MD UPDATED.  09/09/13 KATHY MAHABIR RN,BSN NCM [place] SIGNED MEDICARE IM NOTICE,AWARE TO CALL ONCE D/C ORDER IN PLACE. 3:10P WENT INTO RM TO TALK TO PATIENT, & SPOUSE ABOUT MEDICARE IM NOTICE.PATIENT DEFERS TO SPOUSE TO SIGN, SPOUSE STATES SHE HAS TO LEAVE TO PICK UP SON FROM SCHOOL, BUT SHE WILL RETURN, GAVE HER MEDICARE IM NOTICE.SHE IS AWARE OF FORM & PROCESS.ALSO INFORMED HER THAT [facility] [place] STILL HAS Cross BED AVAILABLE, & CAN ACCEPT, THEY ARE OFFERING FOR SPOUSE TO TOUR FACILITY. SO THERE IS Cross SAFE D/C PLAN TO LTACH.NURSE UPDATED,& AWARE TO CONTACT CM WHEN SPOUSE RETURNS TO RM.AWAITING D/C ORDER. SPOKE TO PATIENT ABOUT MEDICARE IM NOTICE,&  APPEAL PROCESS.TC HIS SPOUSE PER HIS REQUEST.SHE WILL COME TO HOSPITAL,THEN WILL EXPLAIN MEDICARE IM NOTICE,& APPEAL PROCESS, & .MD UPDATED.[facility] REP [place] HAS SUBMITTED INFO TO INSURANCE CO FOR Cross BED OFFER.TC [facility] [place]-YOLANDA LIASON STILL HAS Cross BED AVAILABLE,DR. DELGADO RECEIVING MD.PATIENT CAN BE MANAGED @ AN LTACH,& NO LONGER MEETS ACUTE HOSPITAL STAY.  09/08/13 KATHY MAHABIR RN,BSN NCM [phone] SPOKE TO PATIENT/SPOUSE IN RM ABOUT MEDICARE IM NOTICE-EXPLAINED THE REASON, & PROCESS TO APPEAL IF INSURANCE DENIES LTAC @ [facility].THEY UNDERSTAND THAT THEIR INSURANCE POLICY HAS NO OUT OF NETWORK BENEFIT.THEY BOTH VOICED UNDERSTANDING.MD UPDATED-WILL PROVIDE PROGRESS NOTE,& D/C SUMMARY.NURSE UPDATED.LTAC @ [place] STILL HAS BED AVAILABLE,& WILL ACCEPT,FAXED W/CONFIRMATION UPDATED PROGRESS NOTES.NO SNF BEDS AVAILABLE,HHC IS NOT ABLE TO PROVIDE THE LEVEL OF CARE NEEDED.  09/07/13 KATHY MAHABIR RN,BSN NCM [phone] MD HAS SPOKEN TO SPOUSE ABOUT CURRENT  TREATMENT PLAN, & D/C PLANS-MD UPDATED SPOUSE ON LTAC AVAILABLE IN [place], THEY CAN CALL INSURANCE CO TO TRY TO APPEAL BUT THERE ARE NO GUARANTEES,MED DIRECTOR WILL ALSO TRY TO TALK TO INSURANCE CO-MED DIRECTOR(NO GUARANTEES), THEY MAY WANT TO CONSIDER TRANSFER TO [place] SINCE THAT IS WHERE HE WILL HAVE SURGERY EVENTUALLY.HHC IS CURRENTLY NOT AN OPTION SINCE THEY CANNOT PROVIDE CONTINUOUS ABD WALL SUCTION.  09/06/13 KATHY MAHABIR RN,BSN NCM [phone] LTACH APPROVED,& ACCEPTING PATIENT @ [facility] [place]-TEL#[phone] MELISSA-PATIENT DECLINES GOING-STATES TOO FAR FROM FAMILY. EXPLAINED TO PATIENT THAT THIS IS THE ONLY IN NETWORK LTACH IN HIS INSURANCE PLAN.[facility]-LTACH'S ARE NOT IN NETWORK,& PATIENT DOES NOT HAVE OUT OF NETWORK BENEFITS.EVEN CONTACTED [employer] REP TO CHECK IF ANY WAY POSSIBLE TO CONSIDER [facility],BUT NOT IN NETWORK,& CANNOT GO OUTSIDE OF HIS BENEFIT.EXPLAINED TO PATIENT THAT THE LTACH WOULD BE Cross BETTER TRANSITION BEFORE GOING HOME.PATIENT WANTS TO GO  HOME [facility] ALREADY FOLLOWING BUT CAN ONLY PROVIDE INTERMITTENT SUCTION,NOT CONTINUOUS WHICH IS WHAT THE PATIENT IS GETTING CURRENTLY,THEY WILL ORDER ADDITIONAL EAKIN POUCHE'S THROUGH [facility],BUT NURSE ON FLOOR CAN GIVE PATIENT ENOUGH FOR Cross FEW DAYS IF HOME IS THE D/C PLAN.NOTED TNA IS GOING TO BE CYCLIC VIA PICC.WILL NEED PT TO AMBULATE TO DOCUMENT WHAT PATIENT MAY BENEFIT FROM.HAVE LEFT Cross VM MESSAGE W/SPOUSE TO CALL BACK.WILL CONTINUE TO MONITOR FOR PROGRESS & D/C PLANS.  09/04/13 KATHY MAHABIR RN,BSN NCM [phone] SPOKE TO PATIENT/SPOUSE ABOUT RECEIVING AUTH FOR LTAC IN [place],PATIENT DECLINES LTACH-SPOUSE STATES IT'S TOO  FAR HE WON'T DO WELL IF FAMILY CANNOT VISIT REGULARLY,HE NODS HIS HEAD IN AGREEMENT W/SPOUSE.MD UPDATED.THEREFORE CURRENT D/C PLAN HOME [facility] STILL LOOKING INTO THE SPECIAL POUCHES BEING REQUESTED,BUT They CAN MANAGE THE [facility] INTERMMITTENT SUCTION IF ORDERED FOR HOME.NO CURRENT SNF THAT WILL  ACCEPT PATIENT W/HIGH LEVEL OF NEEDS.  RECEIVED CALL FROM [facility] [place] [id number] RECEIVED AUTH FROM INSURANCE CO,& CAN ACCEPT PATIENT ON MONDAY 09/06/13 .CM TO FAX ON MONDAY D/C SUMMARY,& IF MEDICALLY STABLE & IF NO CHANGE IN STATUS.RECEIVING MD DR. Jacqulyn Bath DELGADO/RM 315/NURSE CALL REPORT TO CALL REPORT NOT UNTIL MONDAY TEL#[phone].NURSE TO ARRANGE TRANSPORTATION,& COMPLETION OF COBRA FORM,ALL TRANSFER REQUIREMENTS PER CARE LINK-SPOKE TO SAM WHO CONFIRMED THEY CAN TRANSFER, COPY OF CHART,& RADIOLOGY REPORTS,ETC.  TC [facility] [place]-[phone] WILL PRESENT CASE TO MEDICAL STAFF/[facility]ANCE CO,PROVIDED HER W/UPDATED INFO-STILL AWAITING IF THEY CAN ACCEPT,& IF INSURANCE CO WILL AUTH.[facility] STILL FOLLOWING-THEY NEED TO CONFIRM IF THEY CAN PROVIDE [place] INTERMITTENT SUCTION, & Cross DIFFERENT TYPE OF OSTOMY POUCH.UNTIL NSG RECEIVES AN MD/RM/BED THEN THEY CAN PROCEED W/TRANSFER TO LTACH IF AUTH.  09/01/13 KATHY MAHABIR RN,BSN NCM [phone] RECEIVED CALL FROM MELISSA @ [facility] [place]-LTACH NEEDING UPDATED PROGRESS NOTES.FAXED W/CONFIRMATION.WILL AWAIT RESPONSE. POD#18.ABD WALL SUCTION/WOUND DSG CHANGES-EAKIN POUCH/OR DIFFERENT TYPE,FISTULA.PICC-IV ABX,TPN.SPOKE TO [place] REP(TEL#[phone]/FAX#[phone] WHO HAS FAXED CLINICALS TO INSURANCE CO.AWAITING THIR [place].[facility]-FOLLOWING FOR SNF,& [facility] FOLLOWING FOR HOME-THEY ARE STILL CHECKING ON WHETHER THEY CAN PROVIDE SERVICES AT [facility] INTERMITTENT SUCTION,& EAKIN POUCHES-THEY WILL CONFIRM.CCS MANAGING-ABD ABSCESS/FISTULA.  08/31/13 KATHY MAHABIR RN,BSN NCM [phone] POD#17,ABD WALL SUCTION W/LARGE AMT THICK BROWN DRAINAGE,DSG CHANGES.PICC-IV ABX,TPN.NOTED WOC NURSE'S NOTE:IF HHC AGENCY COULD PROVIDE INTERMITTENT SUCTION,& EAKIN POUCHES.SPOKE TO [facility]-IV [facility]ERAPY PAM CHANDLER WHO WILL TALK  TO THEIR LEADERSHIP TO MAKE SURE THEY CAN PROVIDE THESE SERVICES,& EQUIPMENT BEFORE WE CONFIRM IF [place] CAN BE THE D/C PLAN.ALSO [facility] HAS SNF,BUT NO BEDS CURRENTLY-ALL INFO HAS BEEN SENT,THEY WILL LET SW KNOW IF THEY CAN PROVIDE SERVICES.ALSO STILL WAITING ON THE OUTCOME FROM AN LTACH THAT HAS SUBMITTED INFO TO INSURANCE COMPANY & ARE AWAITING Cross RESPONSE.  08/30/13 KATHY MAHABIR RN,BSN NCM [phone] POD#16,ABD WOUND-SUCTION,FISTULA,BOWEL REST,PICC-TPN,IV ABX-LONG TERM.SX FOLLOWING.CURRENTLY LTACH'S EXPLORED-NOT IN NETWORK,NO BENEFIT.PATIENT/SPOUSE IN AGREEMENT TO LTACH BEING EXPLORED-HAVE FAXED CLINICALS TO ANOTHER LTAC WILL AWAIT RESPONSE BY TOMORROW LUNCH TIME.CURRENT D/C PLAN SNF-BUT NON ACCEPTING  W/PATIENT MEDICAL NEEDS.[facility] STILL BEHIND THE SCENES IF PLANS ARE FOR HOME W/TNA/IV ABX,& MAYBE WOUND CARE-DSG CHANGES(THEY CANNOT MANAGE WALL SUCTION).   08/26/13 KATHY MAHABIR RN,BSN NCM [phone] POD#12,ABD WOUND,FISTULA,W/NOTE TO Cross NEW AREA-SX/WOC FOLLOWING.PICC-LONG TERM IV ABX,& TPN.CURRENT D/C PLAN SNF.NOTED SW EXPANDING SNF SEARCH.  08/25/13 KATHY MAHABIR RN,BSN NCM [phone] POD#11 REMOVAL OF ABD ABSCESS,DRAINED 3L PUS, INFECTED VENTRAL HERNIA MESH.FISTULA-ABD WOUND-WALL SUCTION.WOUND CARE.IV ABX-LONG TERM-PICC.TPN.NOT LTAC APPROPRIATE SINCE NOT IN NETWORK W/1 FACILITY,& OTHER FACILITY DOES NOT USE OUR SURGEONS.CURRENT D/C PLAN SNF,[facility] REP IS FOLLOWING FOR SNF THAT CAN MANAGE W/THESE NEEDS.[facility] STILL BEHIND THE SCENES IF HOME BECOMES Cross LAST RESORT.  08/23/13 KATHY MAHABIR RN,BSN NCM [id number] POD#9 REMOVAL OF ABD ABSCESS FROM INFECTED VENTRAL HERNIA MESH,&  REMOVAL OF 3L PUS.EC FISTULA,ABD WOUND-SUCTION-MOD OUTPUT.3L PICC-TPN @ 125,NPO,BOWEL REST.IV ABX-VANC/ZOSYN.GEN SX FOLLOWING.D/C PLAN HOME [place].PATIENT STATES GOING TO SNF WOULD COST HIM TOO MUCH.NO LTAC BENEFIT-OON/COSTLY.D/C PLAN HOME [place].[facility] KRISTEN AWARE & FOLLOWING-WILL PROVIDE CO-PAY INFO TO PATIENT.PATIENT UNDERSTANDS HHC,& WILLING TO DO  WHAT IS NECESSARY TO STAY SAFE @  HOME.SPOUSE ALSO IN FAVOR OF HOME [place].  08/20/13 KATHY MAHABIR RN,BSN NCM [phone] [facility] FOLLOWING FOR HHC-RECOMMEND HHRN/PT/OT/AIDE.IV ABX,TNA,WOUND CARE.PICC.  08/19/13 KATHY MAHABIR RN,BSN NCM [phone] LTAC IS NOT AUTH PER INSURANCE CO.OUT OF NETWORK,& NO OON BENEFITS.SPOKE TO PATIENT ABOUT D/C PLANS,HE WANTS HOME [facility].ALSO SPOKE TO SPOUSE ON PHONE WHILE IN PATIENT'S RM,SHE AGREES W/PATIENT IF HE WANTS HOME.INFORMED SW WHO WILL CONFIRM W/PATIENT/SPOUSE.TC [facility] KRISTEN WARE OF D/C PLANS,& FOLLOWING.NOTED PICC-TNA,LONG TERM IV ABX.AWAIT FINAL HHC ORDERS.MD UPDATED. LTAC SCREEN HIGHLY UNLIKELY THAT INSURANCE WILL AUTHORIZE BUT HAVE NOT CONFIRMED YET.SW FOLLOWING FOR SNF.FISTULA-NPO,PICC-TNA.POD#5 ABD ABSCESS/I&D/REMOVAL/DRAIN-WOC-WOUND CARE,POUCH.OUT PUT-150ML.SX FOLLOWING-CURRENTLY NO SX.IV ABX,BOWEL REST.THN ALSO FOLLOWING-CHRONIC MED MGMNT.[facility]-FOLLOWING ALSO.  08/18/13 KATHY MAHABIR RN,BSN NCM [phone] TRANSFER FROM SDU.ABSCESS-DRAINED,& REMOVED.NOW HAS FISTULA.IV ABX.WOUND CARE.LTAC SCREEN.  [id number] Rosana Hoes, RN, BSN, [place] [phone] Chart Reviewed for discharge and hospital needs. Discharge needs at time of review:  None present will follow for needs. Review of patient progress due on [id number].   08/13/13 KATHY MAHABIR RN,BSN NCM [place] 22-24,02 2LNC.D/C PLAN SNF.  08/12/13 KATHY MAHABIR RN,BSN NCM [phone] PT-SNF.OBSV.MD/SW/[facility] REP NOTIFIED.D/C PLAN SNF.

## 2013-08-12 NOTE — Plan of Care (Signed)
Problem: Consults Goal: Skin Care Protocol Initiated - if Braden Score 18 or less If consults are not indicated, leave blank or document N/A Outcome: Progressing Educating patient on good skin care, floating heels and repositioning frequently. Check skin for any type of trauma, because he is a diabetic.

## 2013-08-12 NOTE — Progress Notes (Addendum)
Occupational Therapy Treatment Patient Details Name: Kurt Cross MRN: [id number] DOB: [birth year] Today's Date: 08/12/2013 Time: [id number] OT Time Calculation (min): 18 min  OT Assessment / Plan / Recommendation  History of present illness Kurt Cross is a 62 y.o. male  with known history of chronic systolic heart failure status post defibrillator placement last EF measured was in 2013 was 35-40%, on home oxygen, OSA noncompliant with CPAP, diabetes mellitus, hypertension and hyperlipidemia presented to the ER because right leg pain after sustaining a fall the night prior to admission.     OT comments  Pt needing to return to bed for test so practiced a stand pivot with walker to return to bed. Pt will benefit from continued OT to progress independence with ADL.   Follow Up Recommendations  SNF;Supervision/Assistance - 24 hour    Barriers to Discharge       Equipment Recommendations  None recommended by OT    Recommendations for Other Services    Frequency Min 2X/week   Progress towards OT Goals Progress towards OT goals: Progressing toward goals  Plan Discharge plan remains appropriate    Precautions / Restrictions Precautions Precautions: Fall Restrictions Weight Bearing Restrictions: No   Pertinent Vitals/Pain R LE pain. Reposition. Didn't rate    ADL   Toilet Transfer: Simulated;+2 Total assistance Toilet Transfer: Patient Percentage: 60% Toilet Transfer Method: Stand pivot Toileting - Clothing Manipulation and Hygiene: Simulated;+1 Total assistance Where Assessed - Toileting Clothing Manipulation and Hygiene: Sit to stand from 3-in-1 or toilet Equipment Used: Rolling walker (bariatric walker) ADL Comments: Pt needing to get back to bed for a test. Assisted with PT to transfer pt back to bed with stand pivot and walker. Pt tolerated fairly well but with R LE pain still present. mod assist to stand from chair and +2 for safety for the pivot.     OT Diagnosis:  Generalized weakness;Acute pain  OT Problem List: Decreased strength;Decreased knowledge of use of DME or AE;Pain OT Treatment Interventions: Self-care/ADL training;DME and/or AE instruction;Therapeutic activities;Patient/family education   OT Goals(current goals can now be found in the care plan section) Acute Rehab OT Goals Patient Stated Goal: to be able to walk OT Goal Formulation: With patient Time For Goal Achievement: 08/26/13 Potential to Achieve Goals: Good ADL Goals Pt Will Transfer to Toilet: with min assist;ambulating;bedside commode Pt Will Perform Toileting - Clothing Manipulation and hygiene: with mod assist;sit to/from stand Additional ADL Goal #1: Pt will tolerate standing for 3 minutes for functional ADL Additional ADL Goal #2: pt will verbalize or demo AE use if he decides to obtain for LB ADL  Visit Information  Last OT Received On: 08/12/13 Assistance Needed: +2 PT/OT Co-Evaluation/Treatment: Yes History of Present Illness: Kurt Cross is a 62 y.o. male  with known history of chronic systolic heart failure status post defibrillator placement last EF measured was in 2013 was 35-40%, on home oxygen, OSA noncompliant with CPAP, diabetes mellitus, hypertension and hyperlipidemia presented to the ER because right leg pain after sustaining a fall the night prior to admission.      Subjective Data      Prior Functioning  Home Living Family/patient expects to be discharged to:: Private residence Living Arrangements: Spouse/significant other Available Help at Discharge: Family;Available 24 hours/day Type of Home: House Home Access: Ramped entrance Home Layout: One level Home Equipment: Walker - 2 wheels;Cane - single point Prior Function Level of Independence: Needs assistance Gait / Transfers Assistance Needed: walked with cane or RW  prior to fall on Sunday, then unable to walk 2* RLE pain ADL's / Homemaking Assistance Needed: wife assisted with sponge bath, socks  and pt used cane to "hook" pants around feet Communication Communication: No difficulties    Cognition  Cognition Arousal/Alertness: Awake/alert Behavior During Therapy: WFL for tasks assessed/performed Overall Cognitive Status: Within Functional Limits for tasks assessed    Mobility  Bed Mobility Bed Mobility: Sit to Supine Sit to Supine: 3: Mod assist Details for Bed Mobility Assistance: assist for BLEs into bed Transfers Transfers: Sit to Stand;Stand to Sit Sit to Stand: 3: Mod assist;With upper extremity assist;From chair/3-in-1 Sit to Stand: Patient Percentage: 60% Stand to Sit: 3: Mod assist;With upper extremity assist;To bed;To chair/3-in-1 Stand to Sit: Patient Percentage: 60% Details for Transfer Assistance: increased time for sit to stand, SPT with bariatric RW and decreased WB on RLE 2* pain, VCs for hand placement       Balance     End of Session OT - End of Session Equipment Utilized During Treatment: Rolling walker Activity Tolerance: Patient limited by pain Patient left: in bed;with call bell/phone within reach    Jules Schick [id number] 08/12/2013, 12:57 PM

## 2013-08-12 NOTE — Progress Notes (Signed)
Utilization review completed.  

## 2013-08-13 ENCOUNTER — Observation Stay (HOSPITAL_COMMUNITY): Payer: Medicare Other

## 2013-08-13 DIAGNOSIS — R509 Fever, unspecified: Secondary | ICD-10-CM

## 2013-08-13 LAB — HEPATIC FUNCTION PANEL
ALT: 37 U/L (ref 0–53)
Albumin: 2.2 g/dL — ABNORMAL LOW (ref 3.5–5.2)
Alkaline Phosphatase: 53 U/L (ref 39–117)
Bilirubin, Direct: 0.5 mg/dL — ABNORMAL HIGH (ref 0.0–0.3)
Total Bilirubin: 0.9 mg/dL (ref 0.3–1.2)
Total Protein: 7.1 g/dL (ref 6.0–8.3)

## 2013-08-13 LAB — CBC WITH DIFFERENTIAL/PLATELET
Basophils Relative: 0 % (ref 0–1)
HCT: 39.1 % (ref 39.0–52.0)
Hemoglobin: 13.3 g/dL (ref 13.0–17.0)
Lymphocytes Relative: 7 % — ABNORMAL LOW (ref 12–46)
Lymphs Abs: 1.3 10*3/uL (ref 0.7–4.0)
MCHC: 34 g/dL (ref 30.0–36.0)
Monocytes Absolute: 2.1 10*3/uL — ABNORMAL HIGH (ref 0.1–1.0)
Monocytes Relative: 11 % (ref 3–12)
Neutro Abs: 14.8 10*3/uL — ABNORMAL HIGH (ref 1.7–7.7)
Neutrophils Relative %: 80 % — ABNORMAL HIGH (ref 43–77)
RBC: 4.09 MIL/uL — ABNORMAL LOW (ref 4.22–5.81)
WBC: 18.4 10*3/uL — ABNORMAL HIGH (ref 4.0–10.5)

## 2013-08-13 LAB — BASIC METABOLIC PANEL
BUN: 27 mg/dL — ABNORMAL HIGH (ref 6–23)
Chloride: 93 mEq/L — ABNORMAL LOW (ref 96–112)
Creatinine, Ser: 1.52 mg/dL — ABNORMAL HIGH (ref 0.50–1.35)
GFR calc Af Amer: 56 mL/min — ABNORMAL LOW (ref >90)
GFR calc non Af Amer: 48 mL/min — ABNORMAL LOW (ref >90)
Potassium: 4.2 mEq/L (ref 3.5–5.1)
Sodium: 132 mEq/L — ABNORMAL LOW (ref 135–145)

## 2013-08-13 LAB — URINALYSIS, ROUTINE W REFLEX MICROSCOPIC
Glucose, UA: NEGATIVE mg/dL
Hgb urine dipstick: NEGATIVE
Nitrite: NEGATIVE
Protein, ur: 30 mg/dL — AB
Urobilinogen, UA: 1 mg/dL (ref 0.0–1.0)

## 2013-08-13 LAB — GLUCOSE, CAPILLARY
Glucose-Capillary: 172 mg/dL — ABNORMAL HIGH (ref 70–99)
Glucose-Capillary: 188 mg/dL — ABNORMAL HIGH (ref 70–99)
Glucose-Capillary: 238 mg/dL — ABNORMAL HIGH (ref 70–99)

## 2013-08-13 LAB — URINE CULTURE: Colony Count: NO GROWTH

## 2013-08-13 LAB — PROTIME-INR
INR: 1.31 (ref 0.00–1.49)
Prothrombin Time: 16 seconds — ABNORMAL HIGH (ref 11.6–15.2)

## 2013-08-13 LAB — LACTIC ACID, PLASMA: Lactic Acid, Venous: 2 mmol/L (ref 0.5–2.2)

## 2013-08-13 LAB — URINE MICROSCOPIC-ADD ON

## 2013-08-13 MED ORDER — APIXABAN 5 MG PO TABS
5.0000 mg | ORAL_TABLET | Freq: Two times a day (BID) | ORAL | Status: DC
  Administered 2013-08-13 – 2013-08-14 (×2): 5 mg via ORAL
  Filled 2013-08-13 (×3): qty 1

## 2013-08-13 MED ORDER — ENOXAPARIN SODIUM 60 MG/0.6ML ~~LOC~~ SOLN
60.0000 mg | SUBCUTANEOUS | Status: DC
  Filled 2013-08-13: qty 0.6

## 2013-08-13 NOTE — Progress Notes (Addendum)
ANTICOAGULATION CONSULT NOTE - Initial Consult  Pharmacy Consult for:  Apixaban (Eliquis) Indication:  Nonvalvular atrial fibrillation  No Known Allergies  Patient Measurements: Height: 6' (182.9 cm) Weight: 343 lb 14.7 oz (156 kg) IBW/kg (Calculated) : 77.6  Vital Signs: Temp: 97.8 F (36.6 C) (11/07 1331) Temp src: Oral (11/07 1331) BP: 106/58 mmHg (11/07 1453) Pulse Rate: 68 (11/07 1453)  Labs:  Recent Labs  08/11/13 1450 08/11/13 1954 08/12/13 0050 08/12/13 0655 08/13/13 0512  HGB 13.8  --   --  14.0 13.3  HCT 40.3  --   --  41.0 39.1  PLT 386  --   --  394 363  CREATININE 1.52*  --   --  1.41* 1.52*  TROPONINI  --  <0.30 <0.30 <0.30  --     Estimated Creatinine Clearance: 79.7 ml/min (by C-G formula based on Cr of 1.52).  08/13/13 - Prothrombin Time 16.0 / INR 1.31 08/13/13 - Hepatic Function Panel:  Albumin 2.2  AST 80  ALT 30  Total Bilirubin 0.9 (decreased from 1.9 on 08/01/13)   Medical History: Past Medical History  Diagnosis Date  . Sleep apnea   . Diabetes mellitus   . Hypertension   . Hyperlipidemia   . CHF (congestive heart failure)   . Hernia   . AAA (abdominal aortic aneurysm)/ 4.5 cm ascending per CT angio 08/01/13 08/12/2013    Medications:  Scheduled:  . atorvastatin  40 mg Oral QODAY  . carvedilol  6.25 mg Oral BID WC  . docusate sodium  100 mg Oral BID  . furosemide  80 mg Oral BID WC  . insulin aspart  0-20 Units Subcutaneous TID WC  . insulin aspart  8 Units Subcutaneous TID WC  . insulin glargine  50 Units Subcutaneous QHS  . lisinopril  10 mg Oral BID  . sodium chloride  3 mL Intravenous Q12H    Assessment:  Asked to assist with Apixaban therapy for this 62 year-old male with atrial flutter, probably chronic, with controlled ventricular response.  Previous order for Lovenox 80 mg every 24 hours for VTE prophylaxis, with the last dose given at 22:19 on 11/6.  This medication has been discontinued.  Medication profile  reviewed for potential drug interactions; none were found.  Baseline PT/INR is within normal limits.  Goal of Therapy:  Prevention of stroke and systemic thromboembolism   Plan:   Baseline PT/INR and Hepatic Function Panel as per protocol  Begin Eliquis 5 mg twice a day.  [place]. 08/13/2013 7:18 PM

## 2013-08-13 NOTE — Progress Notes (Signed)
Physical Therapy Treatment Patient Details Name: Kurt Cross MRN: [id number] DOB: April 08, 1951 Today's Date: 08/13/2013 Time: [id number] PT Time Calculation (min): 26 min  PT Assessment / Plan / Recommendation  History of Present Illness Kurt Cross is a 62 y.o. male  with known history of chronic systolic heart failure status post defibrillator placement last EF measured was in 2013 was 35-40%, on home oxygen, OSA noncompliant with CPAP, diabetes mellitus, hypertension and hyperlipidemia presented to the ER because right leg pain after sustaining a fall the night prior to admission.     PT Comments   Assisted pt OOB to amb in hallway.  C/O 7/10 R LE pain from his fall.  Heat applied.  Amb on 2 lts O2 sats avg 88 - 92%.  Pt tolerated amb 65 feet but required 2 rest breaks.  Progressing slowly plans to D/C to SNF.    Follow Up Recommendations  SNF     Does the patient have the potential to tolerate intense rehabilitation     Barriers to Discharge        Equipment Recommendations       Recommendations for Other Services    Frequency Min 3X/week   Progress towards PT Goals Progress towards PT goals: Progressing toward goals  Plan      Precautions / Restrictions Precautions Precautions: Fall Restrictions Weight Bearing Restrictions: No    Pertinent Vitals/Pain C/o R LE pain "in the back of the leg'     Mobility  Bed Mobility Bed Mobility: Supine to Sit Supine to Sit: 5: Supervision Details for Bed Mobility Assistance: increased time Transfers Transfers: Sit to Stand;Stand to Sit Sit to Stand: 4: Min guard;5: Supervision;From bed Stand to Sit: 4: Min guard;5: Supervision;To chair/3-in-1 Details for Transfer Assistance: increased time for sit to stand, SPT with bariatric RW and decreased WB on RLE 2* pain, VCs for hand placement.  Pt tends to lean way forward to shift weight before sit to stand.   Ambulation/Gait Ambulation/Gait Assistance: 4: Min guard Ambulation  Distance (Feet): 65 Feet (15', 25' 35' with sitting rest breaks) Ambulation/Gait Assistance Details: amb on 2 lts nasal sats ranged from 88 - 92%.  25% VC's on purse lip breathing.   Gait Pattern: Step-through pattern;Decreased stride length;Wide base of support;Lateral trunk lean to left Gait velocity: decreased     PT Goals (current goals can now be found in the care plan section)    Visit Information  Last PT Received On: 08/13/13 Assistance Needed: +1 History of Present Illness: Kurt Cross is a 62 y.o. male  with known history of chronic systolic heart failure status post defibrillator placement last EF measured was in 2013 was 35-40%, on home oxygen, OSA noncompliant with CPAP, diabetes mellitus, hypertension and hyperlipidemia presented to the ER because right leg pain after sustaining a fall the night prior to admission.      Subjective Data      Cognition       Balance     End of Session PT - End of Session Equipment Utilized During Treatment: Oxygen Activity Tolerance: Patient tolerated treatment well Patient left: with call bell/phone within reach;in chair   Rica Koyanagi  PTA [facility]  Acute  Rehab Pager      [phone]

## 2013-08-13 NOTE — Progress Notes (Signed)
[facility] PROGRESS NOTE  Kurt Cross [id number] DOB: 09-Sep-1951 DOA: 08/11/2013 PCP: [name], MD  Assessment/Plan: #1 right lower extremity pain Secondary to fall. Pain is more in the posterior thigh/hamstrings and posterior Likely musculoskeletal in nature. Patient with some clinical improvement. Continue warm compresses. PT/OT. Follow.  #2 atrial fibrillation Patient noted on EKG on admission to be in atrial fibrillation. Patient currently in sinus rhythm. Cardiac enzymes have been negative x3. 2-D echo with EF of 50-55%. Wall motion abnormalities cannot be excluded.  Continue Coreg for rate control. Patient has a CHADS2 score of 3. Patient with recent fall and a CT scan to the chest during last hospitalization showed a 4.5 cm ascending aortic aneurysm. Due to patient's fall will place on aspirin daily. Patient noted to go in and out of A. fib with heart rate in the 140s to 150s. Will consult with cardiology for further evaluation and management.  #3 leukocytosis Questionable etiology. Patient is afebrile. Patient recently treated for pneumonia and finished antibiotic course one day prior to admission. Patient currently with no respiratory symptoms. Urinalysis is negative. Patient does not have any diarrhea. Will hold off on antibiotics at this time and monitor.  #4 chronic combined systolic and diastolic CHF Stable. Continue home regimen of Lasix.  #5 chronic kidney disease stage III Stable.  #6 hyperlipidemia Continue statin.  #7 4.5 cm ascending aortic aneurysm per CT scan of 08/01/2013 Stable. Asymptomatic. Continue risk factor modification with good blood pressure control. Will need outpatient followup with vascular surgery as outpatient.  #8 hypertension Well-controlled. Continue Coreg, Lasix, lisinopril.  #9 type 2 diabetes Hemoglobin A1c was 9.5 on 08/02/2013. CBGs have ranged from 188-238. Continue Lantus  50 units daily. Continue meal coverage NovoLog to 8  units 3 times daily.  #10 fever Questionable etiology. Patient recently treated for pneumonia has no respiratory symptoms. Repeat chest x-Brundage is negative. Repeat urinalysis is negative. Will check blood cultures x2. No need for antibiotics at this time. Follow.  #11 prophylaxis Lovenox for DVT prophylaxis.   Code Status: Full Family Communication: Updated patient at bedside no family present. Disposition Plan: SNF when medically stable.   Consultants:  None  Procedures:  2-D echo  08/12/2013  X-Mccandlish of the right hip 08/11/2013  Antibiotics:  None  HPI/Subjective: Patient states leg pain improved. Per nursing patient going in and out of A. fib with heart rates in the 140s to the 150s. Patient asymptomatic denies any chest pain. No shortness of breath. Patient states she's feeling better.  Objective: Filed Vitals:   08/13/13 1453  BP: 106/58  Pulse: 68  Temp:   Resp:     Intake/Output Summary (Last 24 hours) at 08/13/13 1950 Last data filed at 08/13/13 1300  Gross per 24 hour  Intake    720 ml  Output    300 ml  Net    420 ml   Filed Weights   08/11/13 1954 08/12/13 0631 08/13/13 0657  Weight: 154.5 kg (340 lb 9.8 oz) 156.4 kg (344 lb 12.8 oz) 156 kg (343 lb 14.7 oz)    Exam:   General:  NAD  Cardiovascular: RRR  Respiratory: CTAB  Abdomen: Soft/NT/ND/+BS/abdominal hernia.  Musculoskeletal: No c/c/e  Data Reviewed: Basic Metabolic Panel:  Recent Labs Lab 08/11/13 1450 08/12/13 0655 08/13/13 0512  NA 133* 132* 132*  K 4.3 4.2 4.2  CL 92* 91* 93*  CO2 29 27 27   GLUCOSE 233* 272* 166*  BUN 23 23 27*  CREATININE 1.52* 1.41*  1.52*  CALCIUM 9.8 10.1 10.1   Liver Function Tests: No results found for this basename: AST, ALT, ALKPHOS, BILITOT, PROT, ALBUMIN,  in the last 168 hours No results found for this basename: LIPASE, AMYLASE,  in the last 168 hours No results found for this basename: AMMONIA,  in the last 168 hours CBC:  Recent  Labs Lab 08/11/13 1450 08/12/13 0655 08/13/13 0512  WBC 17.8* 18.8* 18.4*  NEUTROABS  --   --  14.8*  HGB 13.8 14.0 13.3  HCT 40.3 41.0 39.1  MCV 96.0 96.0 95.6  PLT 386 394 363   Cardiac Enzymes:  Recent Labs Lab 08/11/13 1954 08/12/13 0050 08/12/13 0655  TROPONINI <0.30 <0.30 <0.30   BNP (last 3 results)  Recent Labs  08/24/12 1050 08/27/12 0500 08/01/13 1649  PROBNP 1036.0* 253.4* 481.4*   CBG:  Recent Labs Lab 08/12/13 1707 08/12/13 2139 08/13/13 0749 08/13/13 1151 08/13/13 1659  GLUCAP 329* 153* 172* 188* 238*    Recent Results (from the past 240 hour(s))  URINE CULTURE     Status: None   Collection Time    08/11/13  6:24 PM      Result Value Range Status   Specimen Description URINE, CLEAN CATCH   Final   Special Requests NONE   Final   Culture  Setup Time     Final   Value: 08/12/2013 02:25     Performed at [place]     Final   Value: NO GROWTH     Performed at [facility]   Culture     Final   Value: NO GROWTH     Performed at [facility]   Report Status 08/13/2013 FINAL   Final     Studies: Dg Chest Port 1 View  08/13/2013   CLINICAL DATA:  Leukocytosis.  EXAM: PORTABLE CHEST - 1 VIEW  COMPARISON:  08/01/2013  FINDINGS: No focal pulmonary consolidation is identified. There is stable mild pulmonary venous prominence without overt edema or visualized pleural fluid. The heart remains mildly enlarged. There is stable appearance of a biventricular pacemaker.  IMPRESSION: No acute findings. Stable cardiomegaly and pulmonary venous prominence without overt edema.   Electronically Signed   By: Aletta Edouard M.D.   On: 08/13/2013 15:44    Scheduled Meds: . atorvastatin  40 mg Oral QODAY  . carvedilol  6.25 mg Oral BID WC  . docusate sodium  100 mg Oral BID  . furosemide  80 mg Oral BID WC  . insulin aspart  0-20 Units Subcutaneous TID WC  . insulin aspart  8 Units Subcutaneous TID WC  . insulin  glargine  50 Units Subcutaneous QHS  . lisinopril  10 mg Oral BID  . sodium chloride  3 mL Intravenous Q12H   Continuous Infusions:   Principal Problem:   Right leg pain Active Problems:   DM   HYPERLIPIDEMIA   OBESITY   OBSTRUCTIVE SLEEP APNEA   Hypertension   Acute-on-chronic respiratory failure secondary to probable community-acquired pneumonia   Unspecified constipation   Chronic combined systolic and diastolic CHF (congestive heart failure)   Leukocytosis   AAA (abdominal aortic aneurysm)/ 4.5 cm ascending per CT angio 08/01/13    Time spent: 35 mins    [facility] Pager [phone]. If 7PM-7AM, please contact night-coverage at [email or web address], password [facility] 08/13/2013, 7:50 PM  LOS: 2 days

## 2013-08-13 NOTE — Consult Note (Signed)
Reason for Consult:new-onset atrial flutter Referring Physician: [facility]  Kurt Cross is an 62 y.o. male.  HPI: patient is 62 year old male with past medical history significant for multiple medical problems i.e. Mild coronary artery disease, history of mild nonischemic cardiomyopathy, history of congestive heart failure secondary to systolic dysfunction, history of complete heart block status post permanent pacemaker in the past, hypertension, insulin requiring diabetes mellitus, morbid obesity, obstructive sleep apnea/obesity hypoventilation syndrome, hypercholesteremia, recently discharged from hospital treated for community-acquired pneumonia came to the ER following fall in the bathroom and in ability to walk. Cardiologic consultation was called as patient was noted to have atrial flutter with moderate ventricular response with occasional rapid ventricular response which appears to be artifact. Patient denies any palpitation lightheadedness or syncopal episode. Denies any PND orthopnea but complains of right leg pain which has improved since admission. Patient denies any chest pain nausea vomiting diaphoresis. EKG done on 08/01/2013 showed atrial flutter with controlled ventricular response. No prior history of atrial fib flutter in the past. His CHADSVAS SCORE IS 5 AND HIGH RISK FOR embolic CVA.  Past Medical History  Diagnosis Date  . Sleep apnea   . Diabetes mellitus   . Hypertension   . Hyperlipidemia   . CHF (congestive heart failure)   . Hernia   . AAA (abdominal aortic aneurysm)/ 4.5 cm ascending per CT angio 08/01/13 08/12/2013    Past Surgical History  Procedure Laterality Date  . Appendectomy    . Tonsillectomy    . Hernia surgeries      multiple surgeries  . Nasal septum surgery      History reviewed. No pertinent family history.  Social History:  reports that he quit smoking about a year ago. He has never used smokeless tobacco. He reports that he does not  drink alcohol or use illicit drugs.  Allergies: No Known Allergies  Medications: I have reviewed the patient's current medications.  Results for orders placed during the hospital encounter of 08/11/13 (from the past 48 hour(s))  URINALYSIS, ROUTINE W REFLEX MICROSCOPIC     Status: Abnormal   Collection Time    08/11/13  6:24 PM      Result Value Range   Color, Urine AMBER (*) YELLOW   Comment: BIOCHEMICALS MAY BE AFFECTED BY COLOR   APPearance CLOUDY (*) CLEAR   Specific Gravity, Urine 1.025  1.005 - 1.030   pH 5.0  5.0 - 8.0   Glucose, UA 100 (*) NEGATIVE mg/dL   Hgb urine dipstick NEGATIVE  NEGATIVE   Bilirubin Urine SMALL (*) NEGATIVE   Ketones, ur NEGATIVE  NEGATIVE mg/dL   Protein, ur 30 (*) NEGATIVE mg/dL   Urobilinogen, UA 4.0 (*) 0.0 - 1.0 mg/dL   Nitrite NEGATIVE  NEGATIVE   Leukocytes, UA NEGATIVE  NEGATIVE  URINE CULTURE     Status: None   Collection Time    08/11/13  6:24 PM      Result Value Range   Specimen Description URINE, CLEAN CATCH     Special Requests NONE     Culture  Setup Time       Value: 08/12/2013 02:25     Performed at [place]       Value: NO GROWTH     Performed at [facility]   Culture       Value: NO GROWTH     Performed at [facility]   Report Status 08/13/2013 FINAL  URINE MICROSCOPIC-ADD ON     Status: Abnormal   Collection Time    08/11/13  6:24 PM      Result Value Range   Squamous Epithelial / LPF MANY (*) RARE   WBC, UA 3-6  <3 WBC/hpf   Bacteria, UA FEW (*) RARE   Casts GRANULAR CAST (*) NEGATIVE   Urine-Other MUCOUS PRESENT     Comment: AMORPHOUS URATES/PHOSPHATES  TROPONIN I     Status: None   Collection Time    08/11/13  7:54 PM      Result Value Range   Troponin I <0.30  <0.30 ng/mL   Comment:            Due to the release kinetics of cTnI,     a negative result within the first hours     of the onset of symptoms does not rule out     myocardial infarction with  certainty.     If myocardial infarction is still suspected,     repeat the test at appropriate intervals.  GLUCOSE, CAPILLARY     Status: Abnormal   Collection Time    08/11/13  9:31 PM      Result Value Range   Glucose-Capillary 269 (*) 70 - 99 mg/dL   Comment 1 Documented in Chart     Comment 2 Notify RN    TROPONIN I     Status: None   Collection Time    08/12/13 12:50 AM      Result Value Range   Troponin I <0.30  <0.30 ng/mL   Comment:            Due to the release kinetics of cTnI,     a negative result within the first hours     of the onset of symptoms does not rule out     myocardial infarction with certainty.     If myocardial infarction is still suspected,     repeat the test at appropriate intervals.  BASIC METABOLIC PANEL     Status: Abnormal   Collection Time    08/12/13  6:55 AM      Result Value Range   Sodium 132 (*) 135 - 145 mEq/L   Potassium 4.2  3.5 - 5.1 mEq/L   Chloride 91 (*) 96 - 112 mEq/L   CO2 27  19 - 32 mEq/L   Glucose, Bld 272 (*) 70 - 99 mg/dL   BUN 23  6 - 23 mg/dL   Creatinine, Ser 1.41 (*) 0.50 - 1.35 mg/dL   Calcium 10.1  8.4 - 10.5 mg/dL   GFR calc non Af Amer 53 (*) >90 mL/min   GFR calc Af Amer 61 (*) >90 mL/min   Comment: (NOTE)     The eGFR has been calculated using the CKD EPI equation.     This calculation has not been validated in all clinical situations.     eGFR's persistently <90 mL/min signify possible Chronic Kidney     Disease.  CBC     Status: Abnormal   Collection Time    08/12/13  6:55 AM      Result Value Range   WBC 18.8 (*) 4.0 - 10.5 K/uL   RBC 4.27  4.22 - 5.81 MIL/uL   Hemoglobin 14.0  13.0 - 17.0 g/dL   HCT 41.0  39.0 - 52.0 %   MCV 96.0  78.0 - 100.0 fL   MCH 32.8  26.0 - 34.0 pg   MCHC  34.1  30.0 - 36.0 g/dL   RDW 14.8  11.5 - 15.5 %   Platelets 394  150 - 400 K/uL  TROPONIN I     Status: None   Collection Time    08/12/13  6:55 AM      Result Value Range   Troponin I <0.30  <0.30 ng/mL   Comment:             Due to the release kinetics of cTnI,     a negative result within the first hours     of the onset of symptoms does not rule out     myocardial infarction with certainty.     If myocardial infarction is still suspected,     repeat the test at appropriate intervals.  GLUCOSE, CAPILLARY     Status: Abnormal   Collection Time    08/12/13  7:14 AM      Result Value Range   Glucose-Capillary 272 (*) 70 - 99 mg/dL   Comment 1 Notify RN     Comment 2 Documented in Chart    GLUCOSE, CAPILLARY     Status: Abnormal   Collection Time    08/12/13 11:52 AM      Result Value Range   Glucose-Capillary 188 (*) 70 - 99 mg/dL  GLUCOSE, CAPILLARY     Status: Abnormal   Collection Time    08/12/13  5:07 PM      Result Value Range   Glucose-Capillary 329 (*) 70 - 99 mg/dL  GLUCOSE, CAPILLARY     Status: Abnormal   Collection Time    08/12/13  9:39 PM      Result Value Range   Glucose-Capillary 153 (*) 70 - 99 mg/dL   Comment 1 Documented in Chart     Comment 2 Notify RN    BASIC METABOLIC PANEL     Status: Abnormal   Collection Time    08/13/13  5:12 AM      Result Value Range   Sodium 132 (*) 135 - 145 mEq/L   Potassium 4.2  3.5 - 5.1 mEq/L   Chloride 93 (*) 96 - 112 mEq/L   CO2 27  19 - 32 mEq/L   Glucose, Bld 166 (*) 70 - 99 mg/dL   BUN 27 (*) 6 - 23 mg/dL   Creatinine, Ser 1.52 (*) 0.50 - 1.35 mg/dL   Calcium 10.1  8.4 - 10.5 mg/dL   GFR calc non Af Amer 48 (*) >90 mL/min   GFR calc Af Amer 56 (*) >90 mL/min   Comment: (NOTE)     The eGFR has been calculated using the CKD EPI equation.     This calculation has not been validated in all clinical situations.     eGFR's persistently <90 mL/min signify possible Chronic Kidney     Disease.  CBC WITH DIFFERENTIAL     Status: Abnormal   Collection Time    08/13/13  5:12 AM      Result Value Range   WBC 18.4 (*) 4.0 - 10.5 K/uL   RBC 4.09 (*) 4.22 - 5.81 MIL/uL   Hemoglobin 13.3  13.0 - 17.0 g/dL   HCT 39.1  39.0 - 52.0 %    MCV 95.6  78.0 - 100.0 fL   MCH 32.5  26.0 - 34.0 pg   MCHC 34.0  30.0 - 36.0 g/dL   RDW 14.7  11.5 - 15.5 %   Platelets 363  150 - 400 K/uL  Neutrophils Relative % 80 (*) 43 - 77 %   Neutro Abs 14.8 (*) 1.7 - 7.7 K/uL   Lymphocytes Relative 7 (*) 12 - 46 %   Lymphs Abs 1.3  0.7 - 4.0 K/uL   Monocytes Relative 11  3 - 12 %   Monocytes Absolute 2.1 (*) 0.1 - 1.0 K/uL   Eosinophils Relative 1  0 - 5 %   Eosinophils Absolute 0.2  0.0 - 0.7 K/uL   Basophils Relative 0  0 - 1 %   Basophils Absolute 0.0  0.0 - 0.1 K/uL  GLUCOSE, CAPILLARY     Status: Abnormal   Collection Time    08/13/13  7:49 AM      Result Value Range   Glucose-Capillary 172 (*) 70 - 99 mg/dL   Comment 1 Notify RN     Comment 2 Documented in Chart    GLUCOSE, CAPILLARY     Status: Abnormal   Collection Time    08/13/13 11:51 AM      Result Value Range   Glucose-Capillary 188 (*) 70 - 99 mg/dL   Comment 1 Notify RN     Comment 2 Documented in Chart    LACTIC ACID, PLASMA     Status: None   Collection Time    08/13/13  3:02 PM      Result Value Range   Lactic Acid, Venous 2.0  0.5 - 2.2 mmol/L    Dg Chest Port 1 View  08/13/2013   CLINICAL DATA:  Leukocytosis.  EXAM: PORTABLE CHEST - 1 VIEW  COMPARISON:  08/01/2013  FINDINGS: No focal pulmonary consolidation is identified. There is stable mild pulmonary venous prominence without overt edema or visualized pleural fluid. The heart remains mildly enlarged. There is stable appearance of a biventricular pacemaker.  IMPRESSION: No acute findings. Stable cardiomegaly and pulmonary venous prominence without overt edema.   Electronically Signed   By: Aletta Edouard M.D.   On: 08/13/2013 15:44    Review of Systems  Constitutional: Negative for fever and chills.  HENT: Negative for hearing loss.   Eyes: Negative for blurred vision and double vision.  Respiratory: Negative for cough, hemoptysis, sputum production and shortness of breath.   Cardiovascular: Negative for  chest pain, palpitations and orthopnea.  Gastrointestinal: Negative for nausea, vomiting and abdominal pain.  Genitourinary: Negative for dysuria.  Musculoskeletal: Positive for falls.  Neurological: Negative for dizziness and headaches.   Blood pressure 106/58, pulse 68, temperature 97.8 F (36.6 C), temperature source Oral, resp. rate 20, height 6' (1.829 m), weight 156 kg (343 lb 14.7 oz), SpO2 97.00%. Physical Exam  Constitutional: He is oriented to person, place, and time.  HENT:  Head: Atraumatic.  Eyes: Conjunctivae are normal. Pupils are equal, round, and reactive to light. Left eye exhibits no discharge. No scleral icterus.  Neck: Normal range of motion. Neck supple. No JVD present. No tracheal deviation present. No thyromegaly present.  Cardiovascular:  Irregularly regular S1 and S2 soft There is soft systolic murmur no S3 gallop  Respiratory: Effort normal and breath sounds normal. No respiratory distress. He has no wheezes. He has no rales.  GI: Soft. Bowel sounds are normal. He exhibits distension. There is no tenderness.  Large ventral hernia noted  Musculoskeletal: He exhibits no edema and no tenderness.  Neurological: He is alert and oriented to person, place, and time.    Assessment/Plan: Probably chronic atrial flutter with controlled ventricular response Mild coronary artery disease Mild nonischemic dilated cardiomyopathy Compensated  systolic heart failure Hypertension Insulin requiring diabetes mellitus Morbid obesity Hypercholesteremia Obstructive sleep apnea/obesity hypoventilation syndrome Chronic kidney disease Marked leukocytosis etiology unclear Status post fall Plan Start Eliquis 5 mg twice daily Stop Lovenox and aspirin Discuss with patient at length regarding TEE cardioversion and flutter ablation and agrees for TEE cardioversion. Will arrange for TEE. Cardioversion for tomorrow or Monday. Dr. Doylene Canard on-call for weekend  [facility]  N 08/13/2013, 4:22 PM

## 2013-08-13 NOTE — Progress Notes (Signed)
Pt A flutter.  MD notified.

## 2013-08-13 NOTE — Progress Notes (Signed)
Pt HR  92.  BP 130/62.  No complaints of chest pain. Asymptomatic.

## 2013-08-13 NOTE — Progress Notes (Signed)
Clinical Social Work Department BRIEF PSYCHOSOCIAL ASSESSMENT 08/13/2013  Patient:  Kurt Cross, Kurt Cross     Account Number:  [id number]     Admit date:  08/11/2013  Clinical Social Worker:  Venia Minks  Date/Time:  08/13/2013 12:00 M  Referred by:  Physician  Date Referred:  08/13/2013 Referred for  SNF Placement   Other Referral:   Interview type:  Patient Other interview type:    PSYCHOSOCIAL DATA Living Status:  FAMILY Admitted from facility:   Level of care:   Primary support name:  Janette Douros Primary support relationship to patient:  SPOUSE Degree of support available:   good    CURRENT CONCERNS Current Concerns  Post-Acute Placement   Other Concerns:    SOCIAL WORK ASSESSMENT / PLAN CSW met with patient. patient is alert and oriented X3. patient in need of snf placement. patient is agreeable to snf placement but isnt happy about going. He defers to his wife for snf choice as he says she is familiar with snf's due to work.   Assessment/plan status:   Other assessment/ plan:   Information/referral to community resources:    PATIENT'S/FAMILY'S RESPONSE TO PLAN OF CARE: patient's response to snf placement is resolve. He plans to go to snf, improve, and go home.

## 2013-08-14 ENCOUNTER — Encounter (HOSPITAL_COMMUNITY): Admission: EM | Disposition: A | Discharge status: Home / Self Care | Payer: Self-pay | Source: Home / Self Care

## 2013-08-14 ENCOUNTER — Encounter (HOSPITAL_COMMUNITY): Payer: Medicare Other | Admitting: Certified Registered"

## 2013-08-14 ENCOUNTER — Inpatient Hospital Stay (HOSPITAL_COMMUNITY): Payer: Medicare Other

## 2013-08-14 ENCOUNTER — Encounter (HOSPITAL_COMMUNITY): Payer: Self-pay | Admitting: Surgery

## 2013-08-14 ENCOUNTER — Inpatient Hospital Stay (HOSPITAL_COMMUNITY): Payer: Medicare Other | Admitting: Certified Registered"

## 2013-08-14 DIAGNOSIS — K651 Peritoneal abscess: Secondary | ICD-10-CM

## 2013-08-14 DIAGNOSIS — L03319 Cellulitis of trunk, unspecified: Secondary | ICD-10-CM

## 2013-08-14 DIAGNOSIS — M79609 Pain in unspecified limb: Secondary | ICD-10-CM

## 2013-08-14 DIAGNOSIS — L02219 Cutaneous abscess of trunk, unspecified: Secondary | ICD-10-CM

## 2013-08-14 DIAGNOSIS — T8579XA Infection and inflammatory reaction due to other internal prosthetic devices, implants and grafts, initial encounter: Secondary | ICD-10-CM

## 2013-08-14 DIAGNOSIS — K432 Incisional hernia without obstruction or gangrene: Secondary | ICD-10-CM | POA: Diagnosis present

## 2013-08-14 HISTORY — DX: Incisional hernia without obstruction or gangrene: K43.2

## 2013-08-14 HISTORY — DX: Infection and inflammatory reaction due to other internal prosthetic devices, implants and grafts, initial encounter: T85.79XA

## 2013-08-14 HISTORY — PX: INCISION AND DRAINAGE ABSCESS: SHX5864

## 2013-08-14 LAB — CBC
HCT: 37.9 % — ABNORMAL LOW (ref 39.0–52.0)
Hemoglobin: 13.1 g/dL (ref 13.0–17.0)
MCHC: 34.6 g/dL (ref 30.0–36.0)
MCV: 95.7 fL (ref 78.0–100.0)
RBC: 3.96 MIL/uL — ABNORMAL LOW (ref 4.22–5.81)
RDW: 14.8 % (ref 11.5–15.5)
WBC: 19.6 10*3/uL — ABNORMAL HIGH (ref 4.0–10.5)

## 2013-08-14 LAB — BASIC METABOLIC PANEL
BUN: 32 mg/dL — ABNORMAL HIGH (ref 6–23)
CO2: 26 mEq/L (ref 19–32)
Chloride: 91 mEq/L — ABNORMAL LOW (ref 96–112)
Creatinine, Ser: 1.94 mg/dL — ABNORMAL HIGH (ref 0.50–1.35)
Glucose, Bld: 162 mg/dL — ABNORMAL HIGH (ref 70–99)
Potassium: 4 mEq/L (ref 3.5–5.1)

## 2013-08-14 LAB — GLUCOSE, CAPILLARY
Glucose-Capillary: 183 mg/dL — ABNORMAL HIGH (ref 70–99)
Glucose-Capillary: 230 mg/dL — ABNORMAL HIGH (ref 70–99)
Glucose-Capillary: 137 mg/dL — ABNORMAL HIGH (ref 70–99)

## 2013-08-14 SURGERY — INCISION AND DRAINAGE, ABSCESS
Anesthesia: General | Wound class: Dirty or Infected

## 2013-08-14 MED ORDER — ACETAMINOPHEN 500 MG PO TABS
1000.0000 mg | ORAL_TABLET | Freq: Three times a day (TID) | ORAL | Status: DC
  Administered 2013-08-14 – 2013-10-15 (×183): 1000 mg via ORAL
  Filled 2013-08-14 (×210): qty 2

## 2013-08-14 MED ORDER — INSULIN ASPART 100 UNIT/ML ~~LOC~~ SOLN: 10.0000 [IU] | Freq: Three times a day (TID) | SUBCUTANEOUS | Status: DC

## 2013-08-14 MED ORDER — METOPROLOL TARTRATE 1 MG/ML IV SOLN
5.0000 mg | Freq: Four times a day (QID) | INTRAVENOUS | Status: DC | PRN
  Filled 2013-08-14: qty 5

## 2013-08-14 MED ORDER — DIPHENHYDRAMINE HCL 50 MG/ML IJ SOLN
12.5000 mg | Freq: Four times a day (QID) | INTRAMUSCULAR | Status: DC | PRN
Start: 2013-08-14 — End: 2013-10-15
  Administered 2013-09-02 – 2013-09-04 (×2): 25 mg via INTRAVENOUS
  Filled 2013-08-14 (×2): qty 1

## 2013-08-14 MED ORDER — PHENYLEPHRINE HCL 10 MG/ML IJ SOLN
INTRAMUSCULAR | Status: DC | PRN
  Administered 2013-08-14 (×3): 80 ug via INTRAVENOUS

## 2013-08-14 MED ORDER — INSULIN GLARGINE 100 UNIT/ML ~~LOC~~ SOLN
60.0000 [IU] | Freq: Every day | SUBCUTANEOUS | Status: DC
  Administered 2013-08-15: 60 [IU] via SUBCUTANEOUS
  Filled 2013-08-14 (×2): qty 0.6

## 2013-08-14 MED ORDER — 0.9 % SODIUM CHLORIDE (POUR BTL) OPTIME
TOPICAL | Status: DC | PRN
  Administered 2013-08-14: 1000 mL

## 2013-08-14 MED ORDER — CHLORHEXIDINE GLUCONATE 4 % EX LIQD
1.0000 "application " | Freq: Once | CUTANEOUS | Status: DC
  Filled 2013-08-14: qty 15

## 2013-08-14 MED ORDER — PROPOFOL 10 MG/ML IV BOLUS
INTRAVENOUS | Status: DC | PRN
  Administered 2013-08-14: 200 mg via INTRAVENOUS

## 2013-08-14 MED ORDER — ONDANSETRON 8 MG/NS 50 ML IVPB
8.0000 mg | Freq: Four times a day (QID) | INTRAVENOUS | Status: DC | PRN
  Filled 2013-08-14: qty 8

## 2013-08-14 MED ORDER — POLYETHYLENE GLYCOL 3350 17 G PO PACK
17.0000 g | PACK | Freq: Two times a day (BID) | ORAL | Status: DC
  Filled 2013-08-14: qty 1

## 2013-08-14 MED ORDER — ONDANSETRON HCL 4 MG/2ML IJ SOLN: 4.0000 mg | Freq: Four times a day (QID) | INTRAMUSCULAR | Status: DC | PRN

## 2013-08-14 MED ORDER — LIP MEDEX EX OINT
1.0000 "application " | TOPICAL_OINTMENT | Freq: Two times a day (BID) | CUTANEOUS | Status: DC
  Administered 2013-08-15 – 2013-10-15 (×116): 1 via TOPICAL
  Filled 2013-08-14 (×7): qty 7

## 2013-08-14 MED ORDER — SODIUM CHLORIDE 0.9 % IR SOLN
Status: DC | PRN
  Administered 2013-08-14 (×2): 1000 mL

## 2013-08-14 MED ORDER — IOHEXOL 300 MG/ML  SOLN
50.0000 mL | Freq: Once | INTRAMUSCULAR | Status: AC | PRN
  Administered 2013-08-14: 50 mL via ORAL

## 2013-08-14 MED ORDER — POLYETHYLENE GLYCOL 3350 17 G PO PACK
17.0000 g | PACK | Freq: Every day | ORAL | Status: DC | PRN
  Filled 2013-08-14: qty 1

## 2013-08-14 MED ORDER — FENTANYL CITRATE 0.05 MG/ML IJ SOLN
INTRAMUSCULAR | Status: DC | PRN
  Administered 2013-08-14: 100 ug via INTRAVENOUS

## 2013-08-14 MED ORDER — PIPERACILLIN-TAZOBACTAM 3.375 G IVPB
3.3750 g | Freq: Three times a day (TID) | INTRAVENOUS | Status: DC
  Administered 2013-08-14 – 2013-08-31 (×50): 3.375 g via INTRAVENOUS
  Filled 2013-08-14 (×55): qty 50

## 2013-08-14 MED ORDER — MORPHINE SULFATE 2 MG/ML IJ SOLN
1.0000 mg | INTRAMUSCULAR | Status: DC | PRN
  Administered 2013-08-15: 4 mg via INTRAVENOUS
  Administered 2013-08-15: 2 mg via INTRAVENOUS
  Administered 2013-08-15 (×3): 4 mg via INTRAVENOUS
  Administered 2013-08-15: 2 mg via INTRAVENOUS
  Administered 2013-08-15: 4 mg via INTRAVENOUS
  Administered 2013-08-16: 2 mg via INTRAVENOUS
  Administered 2013-08-16: 4 mg via INTRAVENOUS
  Filled 2013-08-14 (×2): qty 1
  Filled 2013-08-14: qty 2
  Filled 2013-08-14: qty 1
  Filled 2013-08-14: qty 2
  Filled 2013-08-14 (×2): qty 1
  Filled 2013-08-14: qty 2
  Filled 2013-08-14: qty 1
  Filled 2013-08-14 (×2): qty 2

## 2013-08-14 MED ORDER — LIDOCAINE HCL (PF) 2 % IJ SOLN
INTRAMUSCULAR | Status: DC | PRN
  Administered 2013-08-14: 40 mg

## 2013-08-14 MED ORDER — FENTANYL CITRATE 0.05 MG/ML IJ SOLN
INTRAMUSCULAR | Status: AC
  Filled 2013-08-14: qty 2

## 2013-08-14 MED ORDER — SACCHAROMYCES BOULARDII 250 MG PO CAPS
250.0000 mg | ORAL_CAPSULE | Freq: Two times a day (BID) | ORAL | Status: DC
  Administered 2013-08-15 – 2013-09-24 (×81): 250 mg via ORAL
  Filled 2013-08-14 (×85): qty 1

## 2013-08-14 MED ORDER — PSYLLIUM 95 % PO PACK
1.0000 | PACK | Freq: Two times a day (BID) | ORAL | Status: DC
  Filled 2013-08-14: qty 1

## 2013-08-14 MED ORDER — FUROSEMIDE 40 MG PO TABS
40.0000 mg | ORAL_TABLET | Freq: Two times a day (BID) | ORAL | Status: DC
  Filled 2013-08-14 (×2): qty 1

## 2013-08-14 MED ORDER — LACTATED RINGERS IV SOLN
INTRAVENOUS | Status: DC | PRN
  Administered 2013-08-14: 19:00:00 via INTRAVENOUS

## 2013-08-14 MED ORDER — PHENYLEPHRINE HCL 10 MG/ML IJ SOLN
20.0000 mg | INTRAVENOUS | Status: DC | PRN
  Administered 2013-08-14: 25 ug/min via INTRAVENOUS

## 2013-08-14 MED ORDER — LACTATED RINGERS IV BOLUS (SEPSIS): 1000.0000 mL | Freq: Three times a day (TID) | INTRAVENOUS | Status: AC | PRN

## 2013-08-14 MED ORDER — ONDANSETRON HCL 4 MG/2ML IJ SOLN
INTRAMUSCULAR | Status: DC | PRN
  Administered 2013-08-14: 4 mg via INTRAVENOUS

## 2013-08-14 MED ORDER — FENTANYL CITRATE 0.05 MG/ML IJ SOLN
25.0000 ug | INTRAMUSCULAR | Status: DC | PRN
  Administered 2013-08-14 (×2): 50 ug via INTRAVENOUS

## 2013-08-14 MED ORDER — ROCURONIUM BROMIDE 100 MG/10ML IV SOLN
INTRAVENOUS | Status: DC | PRN
  Administered 2013-08-14: 30 mg via INTRAVENOUS

## 2013-08-14 MED ORDER — MAGIC MOUTHWASH
15.0000 mL | Freq: Four times a day (QID) | ORAL | Status: DC | PRN
  Administered 2013-08-23: 15 mL via ORAL
  Filled 2013-08-14: qty 15

## 2013-08-14 MED ORDER — PSYLLIUM 95 % PO PACK
1.0000 | PACK | Freq: Two times a day (BID) | ORAL | Status: DC | PRN
  Filled 2013-08-14: qty 1

## 2013-08-14 MED ORDER — MIDAZOLAM HCL 5 MG/5ML IJ SOLN
INTRAMUSCULAR | Status: DC | PRN
  Administered 2013-08-14 (×2): 1 mg via INTRAVENOUS

## 2013-08-14 MED ORDER — SUCCINYLCHOLINE CHLORIDE 20 MG/ML IJ SOLN
INTRAMUSCULAR | Status: DC | PRN
  Administered 2013-08-14: 160 mg via INTRAVENOUS

## 2013-08-14 MED ORDER — OXYCODONE HCL 5 MG PO TABS
5.0000 mg | ORAL_TABLET | ORAL | Status: DC | PRN
  Administered 2013-08-15 – 2013-08-24 (×30): 10 mg via ORAL
  Filled 2013-08-14 (×31): qty 2

## 2013-08-14 MED ORDER — GLYCOPYRROLATE 0.2 MG/ML IJ SOLN
INTRAMUSCULAR | Status: DC | PRN
  Administered 2013-08-14: .8 mg via INTRAVENOUS

## 2013-08-14 MED ORDER — NEOSTIGMINE METHYLSULFATE 1 MG/ML IJ SOLN
INTRAMUSCULAR | Status: DC | PRN
  Administered 2013-08-14: 5 mg via INTRAVENOUS

## 2013-08-14 SURGICAL SUPPLY — 34 items
BANDAGE CONFORM 3  STR LF (GAUZE/BANDAGES/DRESSINGS) ×1 IMPLANT
BANDAGE GAUZE ELAST BULKY 4 IN (GAUZE/BANDAGES/DRESSINGS) ×5 IMPLANT
BLADE SURG 15 STRL LF DISP TIS (BLADE) ×1 IMPLANT
BLADE SURG 15 STRL SS (BLADE) ×2
BRIEF STRETCH FOR OB PAD LRG (UNDERPADS AND DIAPERS) ×2 IMPLANT
CANISTER SUCTION 2500CC (MISCELLANEOUS) ×2 IMPLANT
DECANTER SPIKE VIAL GLASS SM (MISCELLANEOUS) IMPLANT
DRAPE LAPAROSCOPIC ABDOMINAL (DRAPES) IMPLANT
DRSG PAD ABDOMINAL 8X10 ST (GAUZE/BANDAGES/DRESSINGS) ×5 IMPLANT
ELECT REM PT RETURN 9FT ADLT (ELECTROSURGICAL) ×2
ELECTRODE REM PT RTRN 9FT ADLT (ELECTROSURGICAL) ×1 IMPLANT
GLOVE ECLIPSE 8.0 STRL XLNG CF (GLOVE) ×2 IMPLANT
GLOVE INDICATOR 8.0 STRL GRN (GLOVE) ×2 IMPLANT
GOWN STRL REIN XL XLG (GOWN DISPOSABLE) ×4 IMPLANT
IV SET HUBERPLUS 22X1 SAFETY (NEEDLE) ×2 IMPLANT
KIT BASIN OR (CUSTOM PROCEDURE TRAY) ×2 IMPLANT
NS IRRIG 1000ML POUR BTL (IV SOLUTION) ×2 IMPLANT
PACK BASIC VI WITH GOWN DISP (CUSTOM PROCEDURE TRAY) ×2 IMPLANT
PENCIL BUTTON HOLSTER BLD 10FT (ELECTRODE) ×2 IMPLANT
SPONGE GAUZE 4X4 12PLY (GAUZE/BANDAGES/DRESSINGS) IMPLANT
SPONGE LAP 18X18 X RAY DECT (DISPOSABLE) ×2 IMPLANT
SUT MNCRL AB 4-0 PS2 18 (SUTURE) IMPLANT
SUT VIC AB 2-0 SH 27 (SUTURE)
SUT VIC AB 2-0 SH 27X BRD (SUTURE) IMPLANT
SUT VIC AB 3-0 SH 27 (SUTURE)
SUT VIC AB 3-0 SH 27XBRD (SUTURE) IMPLANT
SWAB COLLECTION DEVICE MRSA (MISCELLANEOUS) IMPLANT
SYR 20CC LL (SYRINGE) ×2 IMPLANT
SYR BULB IRRIGATION 50ML (SYRINGE) ×2 IMPLANT
TAPE CLOTH SURG 6X10 WHT LF (GAUZE/BANDAGES/DRESSINGS) ×1 IMPLANT
TOWEL OR 17X26 10 PK STRL BLUE (TOWEL DISPOSABLE) ×2 IMPLANT
TOWEL OR NON WOVEN STRL DISP B (DISPOSABLE) ×2 IMPLANT
TUBE ANAEROBIC SPECIMEN COL (MISCELLANEOUS) IMPLANT
YANKAUER SUCT BULB TIP NO VENT (SUCTIONS) ×2 IMPLANT

## 2013-08-14 NOTE — Progress Notes (Signed)
[facility] PROGRESS NOTE  Kurt Cross [id number] DOB: 1951/04/05 DOA: 08/11/2013 PCP: [name], MD  Assessment/Plan: #1 right lower extremity pain Secondary to fall. Pain is more in the posterior thigh/hamstrings and posterior Likely musculoskeletal in nature. Patient with clinical improvement. Continue warm compresses. PT/OT. Follow.  #2 atrial fibrillation/atrial flutter Patient noted on EKG on admission to be in atrial fibrillation. Patient currently in atrial flutter/A. fib rate controlled. Cardiac enzymes have been negative x3. 2-D echo with EF of 50-55%. Wall motion abnormalities cannot be excluded.  Continue Coreg for rate control. Patient has a CHADS2 score of 3. Patient with recent fall and a CT scan to the chest during last hospitalization showed a 4.5 cm ascending aortic aneurysm. Patient has been seen by cardiology and patient has been started on eliquis yesterday. Per cardiology patient for TEE/cardioversion on Monday, 08/16/2013. Cardiology following and appreciate input and recommendations.  #3 leukocytosis/fever Questionable etiology. Patient is afebrile. Patient recently treated for pneumonia and finished antibiotic course one day prior to admission from his recent admission. Patient currently with no respiratory symptoms. Urinalysis is negative. Patient does not have any diarrhea. Repeat chest x-Neider is negative. Patient today with some abdominal distention some firmness to his abdomen and some wall however does not look like a typical cellulitis. Will check a CT of the abdomen and pelvis with oral contrast only for further evaluation. Will hold off on antibiotics at this time and monitor.  #4 chronic combined systolic and diastolic CHF Stable. We'll decrease Lasix dose secondary to increasing renal function.   #5 chronic kidney disease stage III Creatinine is trending up. Will discontinue ACE inhibitor. Will cut Lasix dose in half.   #6 hyperlipidemia Continue  statin.  #7 4.5 cm ascending aortic aneurysm per CT scan of 08/01/2013 Stable. Asymptomatic. Continue risk factor modification with good blood pressure control. Will need outpatient followup with vascular surgery as outpatient.  #8 hypertension Well-controlled. Continue Coreg, Lasix, lisinopril.  #9 type 2 diabetes Hemoglobin A1c was 9.5 on 08/02/2013. CBGs have ranged from 188-238. Continue Lantus  50 units daily. Continue meal coverage NovoLog to 8 units 3 times daily.  #11 prophylaxis Lovenox for DVT prophylaxis.   Code Status: Full Family Communication: Updated patient at bedside no family present. Disposition Plan: SNF when medically stable.   Consultants:  Cardiology: Dr. Terrence Dupont 08/13/2013  Procedures:  2-D echo  08/12/2013  X-Hildenbrand of the right hip 08/11/2013  CT abdomen and pelvis pending  Antibiotics:  None  HPI/Subjective: Patient states leg pain improved. Patient asymptomatic denies any chest pain. No shortness of breath. Patient states he's feeling better. No complaints.  Objective: Filed Vitals:   08/14/13 1016  BP: 127/60  Pulse:   Temp:   Resp:     Intake/Output Summary (Last 24 hours) at 08/14/13 1205 Last data filed at 08/14/13 0900  Gross per 24 hour  Intake    720 ml  Output    200 ml  Net    520 ml   Filed Weights   08/12/13 0631 08/13/13 0657 08/14/13 0540  Weight: 156.4 kg (344 lb 12.8 oz) 156 kg (343 lb 14.7 oz) 156.5 kg (345 lb 0.3 oz)    Exam:   General:  NAD  Cardiovascular: RRR  Respiratory: CTAB  Abdomen: Soft/NT/+BS/abdominal hernia/distended, firm, warmth noted.  Musculoskeletal: No c/c/e  Data Reviewed: Basic Metabolic Panel:  Recent Labs Lab 08/11/13 1450 08/12/13 0655 08/13/13 0512 08/14/13 0355  NA 133* 132* 132* 130*  K 4.3 4.2 4.2  4.0  CL 92* 91* 93* 91*  CO2 29 27 27 26   GLUCOSE 233* 272* 166* 162*  BUN 23 23 27* 32*  CREATININE 1.52* 1.41* 1.52* 1.94*  CALCIUM 9.8 10.1 10.1 9.8   Liver  Function Tests:  Recent Labs Lab 08/13/13 2007  AST 80*  ALT 37  ALKPHOS 53  BILITOT 0.9  PROT 7.1  ALBUMIN 2.2*   No results found for this basename: LIPASE, AMYLASE,  in the last 168 hours No results found for this basename: AMMONIA,  in the last 168 hours CBC:  Recent Labs Lab 08/11/13 1450 08/12/13 0655 08/13/13 0512 08/14/13 0355  WBC 17.8* 18.8* 18.4* 19.6*  NEUTROABS  --   --  14.8*  --   HGB 13.8 14.0 13.3 13.1  HCT 40.3 41.0 39.1 37.9*  MCV 96.0 96.0 95.6 95.7  PLT 386 394 363 325   Cardiac Enzymes:  Recent Labs Lab 08/11/13 1954 08/12/13 0050 08/12/13 0655  TROPONINI <0.30 <0.30 <0.30   BNP (last 3 results)  Recent Labs  08/24/12 1050 08/27/12 0500 08/01/13 1649  PROBNP 1036.0* 253.4* 481.4*   CBG:  Recent Labs Lab 08/13/13 0749 08/13/13 1151 08/13/13 1659 08/13/13 2123 08/14/13 0808  GLUCAP 172* 188* 238* 162* 183*    Recent Results (from the past 240 hour(s))  URINE CULTURE     Status: None   Collection Time    08/11/13  6:24 PM      Result Value Range Status   Specimen Description URINE, CLEAN CATCH   Final   Special Requests NONE   Final   Culture  Setup Time     Final   Value: 08/12/2013 02:25     Performed at [facility]     Final   Value: NO GROWTH     Performed at [facility]   Culture     Final   Value: NO GROWTH     Performed at [facility]   Report Status 08/13/2013 FINAL   Final  CULTURE, BLOOD (ROUTINE X 2)     Status: None   Collection Time    08/13/13  2:55 PM      Result Value Range Status   Specimen Description BLOOD LEFT HAND   Final   Special Requests BOTTLES DRAWN AEROBIC ONLY 10CC   Final   Culture  Setup Time     Final   Value: 08/13/2013 22:30     Performed at [facility]   Culture     Final   Value:        BLOOD CULTURE RECEIVED NO GROWTH TO DATE CULTURE WILL BE HELD FOR 5 DAYS BEFORE ISSUING A FINAL NEGATIVE REPORT     Performed at [facility]   Report Status PENDING   Incomplete  CULTURE, BLOOD (ROUTINE X 2)     Status: None   Collection Time    08/13/13  3:00 PM      Result Value Range Status   Specimen Description BLOOD LEFT ARM   Final   Special Requests BOTTLES DRAWN AEROBIC AND ANAEROBIC 10CC   Final   Culture  Setup Time     Final   Value: 08/13/2013 22:31     Performed at [facility]   Culture     Final   Value:        BLOOD CULTURE RECEIVED NO GROWTH TO DATE CULTURE WILL BE HELD FOR 5 DAYS BEFORE ISSUING  A FINAL NEGATIVE REPORT     Performed at [facility]   Report Status PENDING   Incomplete     Studies: Dg Chest Port 1 View  08/13/2013   CLINICAL DATA:  Leukocytosis.  EXAM: PORTABLE CHEST - 1 VIEW  COMPARISON:  08/01/2013  FINDINGS: No focal pulmonary consolidation is identified. There is stable mild pulmonary venous prominence without overt edema or visualized pleural fluid. The heart remains mildly enlarged. There is stable appearance of a biventricular pacemaker.  IMPRESSION: No acute findings. Stable cardiomegaly and pulmonary venous prominence without overt edema.   Electronically Signed   By: Aletta Edouard M.D.   On: 08/13/2013 15:44    Scheduled Meds: . apixaban  5 mg Oral BID  . atorvastatin  40 mg Oral QODAY  . carvedilol  6.25 mg Oral BID WC  . docusate sodium  100 mg Oral BID  . furosemide  80 mg Oral BID WC  . insulin aspart  0-20 Units Subcutaneous TID WC  . insulin aspart  8 Units Subcutaneous TID WC  . insulin glargine  50 Units Subcutaneous QHS  . lisinopril  10 mg Oral BID  . sodium chloride  3 mL Intravenous Q12H   Continuous Infusions:   Principal Problem:   Right leg pain Active Problems:   DM   HYPERLIPIDEMIA   OBESITY   OBSTRUCTIVE SLEEP APNEA   Hypertension   Acute-on-chronic respiratory failure secondary to probable community-acquired pneumonia   Unspecified constipation   Chronic combined systolic and diastolic CHF (congestive heart failure)    Leukocytosis   AAA (abdominal aortic aneurysm)/ 4.5 cm ascending per CT angio 08/01/13    Time spent: 35 mins    [facility] Pager [phone]. If 7PM-7AM, please contact night-coverage at [email or web address], password [facility] 08/14/2013, 12:05 PM  LOS: 3 days

## 2013-08-14 NOTE — Progress Notes (Signed)
Placed pt on auto CPAP mode 5-20cmH2O with 1 lpm oxygen bled in. RN notified. Pt is wearing full face mask as he wears at home. Sterile water added to humidity chamber

## 2013-08-14 NOTE — Progress Notes (Signed)
VASCULAR LAB PRELIMINARY  PRELIMINARY  PRELIMINARY  PRELIMINARY  Bilateral lower extremity venous Dopplers completed.    Preliminary report:  There is no DVT or SVT noted in the visualized veins of the bilateral lower extremities.  Kurt Cross, RVT 08/14/2013, 1:07 PM

## 2013-08-14 NOTE — Consult Note (Signed)
Kurt Cross  1951/04/30 [id number]  CARE TEAM:  PCP: [name], MD  Outpatient Care Team: Patient Care Team: [name], MD as PCP - General (Internal Medicine) [name], MD as Consulting Physician (Cardiology) [name], MD as Consulting Physician (Pulmonary Disease)  Inpatient Treatment Team: Treatment Team: Attending Provider: [name], MD; Rounding Team: Redmond Baseman, MD; Technician: Olevia Bowens, [place]; Technician: Coralie Carpen, NT; Consulting Physician: [name], MD; Registered Nurse: Jacquelin Hawking, RN; Technician: Harlen Labs, NT; Technician: Lequita Asal, NT; Consulting Physician: Nolon Nations, MD  This patient is a 62 y.o.male who presents today for surgical evaluation at the request of Dr Grandville Silos.   Reason for evaluation: Giant abdominal wall abscess with fever and leukocytosis.  Morbid obese male with numerous prior abdominal surgeries.  Cannot recall the surgeries.  Describes an episode of falling with bowel perforation.  Required emergency laparotomy and colostomy creation.  Then an ostomy takedown.  And then developed incisional hernia that was repaired with mesh.  All this done by Dr. Elesa Hacker about 30 years ago.  No other surgeries since.  Distant history of appendectomy when he was younger.  He developed swelling in the central part of his abdomen.  It is gradually gotten worse and become more tight.  This has been going over the past month.  He was admitted last week.  Fever and leukocytosis.  CT chest negative but concern for possible pneumonia.  Treated with antibiotics.  Leukocytosis improved.  He was discharged home.  He fell down the bathtub and felt hip and thigh pain.  X-rays negative for fracture.  Admitted to stabilize.  Found to be in atrial fibrillation/flutter.  Given cord for rate control.  Cardiology consultation made.  Placed on Eliquis anticoagulation starting last night.  Has received 2 doses.  Last dose this morning.   Anticipating cardioversion in 2 days.  Worsening abdominal distention noted.  CT scan today reveals large fluid pocket with gas in it overlying the mesh.  Pushing in on his bowel.  Knuckle of colon within a recurrent hernia inferiorly.  Surgical consultation requested.  Past Medical History  Diagnosis Date  . Sleep apnea   . Diabetes mellitus   . Hypertension   . Hyperlipidemia   . CHF (congestive heart failure)   . Hernia   . AAA (abdominal aortic aneurysm)/ 4.5 cm ascending per CT angio 08/01/13 08/12/2013    Past Surgical History  Procedure Laterality Date  . Appendectomy    . Tonsillectomy    . Hernia surgeries      multiple surgeries  . Nasal septum surgery      History   Social History  . Marital Status: Married    Spouse Name: N/A    Number of Children: N/A  . Years of Education: N/A   Occupational History  . Not on file.   Social History Main Topics  . Smoking status: Former Smoker -- 1.00 packs/day for 10 years    Quit date: 08/21/2012  . Smokeless tobacco: Never Used  . Alcohol Use: No  . Drug Use: No  . Sexual Activity: Not on file   Other Topics Concern  . Not on file   Social History Narrative  . No narrative on file    History reviewed. No pertinent family history.  Current Facility-Administered Medications  Medication Dose Route Frequency Provider Last Rate Last Dose  . 0.9 %  sodium chloride infusion  250 mL Intravenous  PRN [name], MD      . acetaminophen (TYLENOL) tablet 650 mg  650 mg Oral Q6H PRN [name], MD   650 mg at 08/13/13 2135   Or  . acetaminophen (TYLENOL) suppository 650 mg  650 mg Rectal Q6H PRN [name], MD      . alum & mag hydroxide-simeth (MAALOX/MYLANTA) 200-200-20 MG/5ML suspension 30 mL  30 mL Oral Q6H PRN [name], MD      . atorvastatin (LIPITOR) tablet 40 mg  40 mg Oral QODAY [name], MD   40 mg at 08/13/13 1008  . carvedilol (COREG) tablet 6.25 mg  6.25 mg Oral BID WC  [name], MD   6.25 mg at 08/14/13 0829  . docusate sodium (COLACE) capsule 100 mg  100 mg Oral BID [name], MD   100 mg at 08/14/13 1017  . furosemide (LASIX) tablet 40 mg  40 mg Oral BID WC [name], MD      . insulin aspart (novoLOG) injection 0-20 Units  0-20 Units Subcutaneous TID WC [name], MD   7 Units at 08/14/13 1256  . insulin aspart (novoLOG) injection 8 Units  8 Units Subcutaneous TID WC [name], MD   8 Units at 08/14/13 [phone]  . insulin glargine (LANTUS) injection 50 Units  50 Units Subcutaneous QHS [name], MD   50 Units at 08/13/13 2141  . morphine 2 MG/ML injection 2 mg  2 mg Intravenous Q4H PRN [name], MD   2 mg at 08/12/13 2030  . ondansetron (ZOFRAN) tablet 4 mg  4 mg Oral Q6H PRN [name], MD       Or  . ondansetron [facility]) injection 4 mg  4 mg Intravenous Q6H PRN [name], MD      . oxyCODONE (Oxy IR/ROXICODONE) immediate release tablet 5 mg  5 mg Oral Q4H PRN [name], MD   5 mg at 08/14/13 0547  . oxyCODONE-acetaminophen (PERCOCET/ROXICET) 5-325 MG per tablet 1-2 tablet  1-2 tablet Oral Q4H PRN [name], MD   1 tablet at 08/13/13 2135  . piperacillin-tazobactam (ZOSYN) IVPB 3.375 g  3.375 g Intravenous Q8H Thuyvan Thi Phan, RPH      . polyethylene glycol (MIRALAX / GLYCOLAX) packet 17 g  17 g Oral Daily PRN [name], MD      . sodium chloride 0.9 % injection 3 mL  3 mL Intravenous Q12H [name], MD   3 mL at 08/14/13 1016  . sodium chloride 0.9 % injection 3 mL  3 mL Intravenous PRN [name], MD      . sorbitol 70 % solution 30 mL  30 mL Oral Daily PRN [name], MD         No Known Allergies  ROS: Constitutional:  +fevers/chills/sweats.  Weight stable Eyes:  No vision changes, No discharge HENT:  No sore throats, nasal drainage Lymph: No neck swelling, No bruising easily Pulmonary:  No cough, productive sputum CV: +orthopnea,  +PND  Patient walks 5 minutes with DOE.  No exertional chest/neck/shoulder/arm pain. GI: No personal nor family history of GI/colon cancer, inflammatory bowel disease, irritable bowel syndrome, allergy such as Celiac Sprue, dietary/dairy problems, colitis, ulcers nor gastritis.  No recent sick contacts/gastroenteritis.  No travel outside the country.  No changes in diet. Renal: No UTIs, No hematuria Genital:  No drainage, bleeding, masses Musculoskeletal: No  severe joint pain aside from R hip (that is improving).  Good ROM major joints Skin:  No sores or lesions.  No rashes Heme/Lymph:  No easy bleeding.  No swollen lymph nodes Neuro: No focal weakness/numbness.  No seizures Psych: No suicidal ideation.  No hallucinations  BP 127/60  Pulse 92  Temp(Src) 98.9 F (37.2 C) (Oral)  Resp 18  Ht 6' (1.829 m)  Wt 345 lb 0.3 oz (156.5 kg)  BMI 46.78 kg/m2  SpO2 98%  Physical Exam: General: Pt awake/alert/oriented x4 in no major acute distress Eyes: PERRL, normal EOM. Sclera nonicteric Neuro: CN II-XII intact w/o focal sensory/motor deficits. Lymph: No head/neck/groin lymphadenopathy Psych:  No delerium/psychosis/paranoia HENT: Normocephalic, Mucus membranes moist.  No thrush Neck: Supple, No tracheal deviation Chest: No pain.  Good respiratory excursion. CV:  Pulses intact.  Irregular rhythm Abdomen: SUPEROBESE with moderate panniculus.  Mod distended.  20x15cm raised central abd mass - flucuant & hot with some erythema.  Pannicular peau d'orange edema.  No peritonitis Ext:  SCDs BLE.  1+ edema.  No cyanosis Skin: No petechiae / purpurea.  No major sores Musculoskeletal: No severe joint pain x R hip soreness.  Good ROM major joints   Results:   Labs: Results for orders placed during the hospital encounter of 08/11/13 (from the past 48 hour(s))  GLUCOSE, CAPILLARY     Status: Abnormal   Collection Time    08/12/13  5:07 PM      Result Value Range   Glucose-Capillary 329 (*) 70 - 99  mg/dL  GLUCOSE, CAPILLARY     Status: Abnormal   Collection Time    08/12/13  9:39 PM      Result Value Range   Glucose-Capillary 153 (*) 70 - 99 mg/dL   Comment 1 Documented in Chart     Comment 2 Notify RN    BASIC METABOLIC PANEL     Status: Abnormal   Collection Time    08/13/13  5:12 AM      Result Value Range   Sodium 132 (*) 135 - 145 mEq/L   Potassium 4.2  3.5 - 5.1 mEq/L   Chloride 93 (*) 96 - 112 mEq/L   CO2 27  19 - 32 mEq/L   Glucose, Bld 166 (*) 70 - 99 mg/dL   BUN 27 (*) 6 - 23 mg/dL   Creatinine, Ser 1.52 (*) 0.50 - 1.35 mg/dL   Calcium 10.1  8.4 - 10.5 mg/dL   GFR calc non Af Amer 48 (*) >90 mL/min   GFR calc Af Amer 56 (*) >90 mL/min   Comment: (NOTE)     The eGFR has been calculated using the CKD EPI equation.     This calculation has not been validated in all clinical situations.     eGFR's persistently <90 mL/min signify possible Chronic Kidney     Disease.  CBC WITH DIFFERENTIAL     Status: Abnormal   Collection Time    08/13/13  5:12 AM      Result Value Range   WBC 18.4 (*) 4.0 - 10.5 K/uL   RBC 4.09 (*) 4.22 - 5.81 MIL/uL   Hemoglobin 13.3  13.0 - 17.0 g/dL   HCT 39.1  39.0 - 52.0 %   MCV 95.6  78.0 - 100.0 fL   MCH 32.5  26.0 - 34.0 pg   MCHC 34.0  30.0 - 36.0 g/dL   RDW 14.7  11.5 - 15.5 %   Platelets 363  150 -  400 K/uL   Neutrophils Relative % 80 (*) 43 - 77 %   Neutro Abs 14.8 (*) 1.7 - 7.7 K/uL   Lymphocytes Relative 7 (*) 12 - 46 %   Lymphs Abs 1.3  0.7 - 4.0 K/uL   Monocytes Relative 11  3 - 12 %   Monocytes Absolute 2.1 (*) 0.1 - 1.0 K/uL   Eosinophils Relative 1  0 - 5 %   Eosinophils Absolute 0.2  0.0 - 0.7 K/uL   Basophils Relative 0  0 - 1 %   Basophils Absolute 0.0  0.0 - 0.1 K/uL  GLUCOSE, CAPILLARY     Status: Abnormal   Collection Time    08/13/13  7:49 AM      Result Value Range   Glucose-Capillary 172 (*) 70 - 99 mg/dL   Comment 1 Notify RN     Comment 2 Documented in Chart    GLUCOSE, CAPILLARY     Status: Abnormal    Collection Time    08/13/13 11:51 AM      Result Value Range   Glucose-Capillary 188 (*) 70 - 99 mg/dL   Comment 1 Notify RN     Comment 2 Documented in Chart    CULTURE, BLOOD (ROUTINE X 2)     Status: None   Collection Time    08/13/13  2:55 PM      Result Value Range   Specimen Description BLOOD LEFT HAND     Special Requests BOTTLES DRAWN AEROBIC ONLY 10CC     Culture  Setup Time       Value: 08/13/2013 22:30     Performed at [facility]   Culture       Value:        BLOOD CULTURE RECEIVED NO GROWTH TO DATE CULTURE WILL BE HELD FOR 5 DAYS BEFORE ISSUING A FINAL NEGATIVE REPORT     Performed at [facility]   Report Status PENDING    CULTURE, BLOOD (ROUTINE X 2)     Status: None   Collection Time    08/13/13  3:00 PM      Result Value Range   Specimen Description BLOOD LEFT ARM     Special Requests BOTTLES DRAWN AEROBIC AND ANAEROBIC 10CC     Culture  Setup Time       Value: 08/13/2013 22:31     Performed at [facility]   Culture       Value:        BLOOD CULTURE RECEIVED NO GROWTH TO DATE CULTURE WILL BE HELD FOR 5 DAYS BEFORE ISSUING A FINAL NEGATIVE REPORT     Performed at [facility]   Report Status PENDING    LACTIC ACID, PLASMA     Status: None   Collection Time    08/13/13  3:02 PM      Result Value Range   Lactic Acid, Venous 2.0  0.5 - 2.2 mmol/L  URINALYSIS, ROUTINE W REFLEX MICROSCOPIC     Status: Abnormal   Collection Time    08/13/13  3:11 PM      Result Value Range   Color, Urine ORANGE (*) YELLOW   Comment: BIOCHEMICALS MAY BE AFFECTED BY COLOR   APPearance CLOUDY (*) CLEAR   Specific Gravity, Urine 1.026  1.005 - 1.030   pH 5.0  5.0 - 8.0   Glucose, UA NEGATIVE  NEGATIVE mg/dL   Hgb urine dipstick NEGATIVE  NEGATIVE   Bilirubin Urine SMALL (*)  NEGATIVE   Ketones, ur NEGATIVE  NEGATIVE mg/dL   Protein, ur 30 (*) NEGATIVE mg/dL   Urobilinogen, UA 1.0  0.0 - 1.0 mg/dL   Nitrite NEGATIVE  NEGATIVE   Leukocytes,  UA SMALL (*) NEGATIVE  URINE MICROSCOPIC-ADD ON     Status: Abnormal   Collection Time    08/13/13  3:11 PM      Result Value Range   WBC, UA 0-2  <3 WBC/hpf   Casts HYALINE CASTS (*) NEGATIVE  GLUCOSE, CAPILLARY     Status: Abnormal   Collection Time    08/13/13  4:59 PM      Result Value Range   Glucose-Capillary 238 (*) 70 - 99 mg/dL   Comment 1 Notify RN     Comment 2 Documented in Chart    HEPATIC FUNCTION PANEL     Status: Abnormal   Collection Time    08/13/13  8:07 PM      Result Value Range   Total Protein 7.1  6.0 - 8.3 g/dL   Albumin 2.2 (*) 3.5 - 5.2 g/dL   AST 80 (*) 0 - 37 U/L   ALT 37  0 - 53 U/L   Alkaline Phosphatase 53  39 - 117 U/L   Total Bilirubin 0.9  0.3 - 1.2 mg/dL   Bilirubin, Direct 0.5 (*) 0.0 - 0.3 mg/dL   Indirect Bilirubin 0.4  0.3 - 0.9 mg/dL  PROTIME-INR     Status: Abnormal   Collection Time    08/13/13  8:09 PM      Result Value Range   Prothrombin Time 16.0 (*) 11.6 - 15.2 seconds   INR 1.31  0.00 - 1.49  GLUCOSE, CAPILLARY     Status: Abnormal   Collection Time    08/13/13  9:23 PM      Result Value Range   Glucose-Capillary 162 (*) 70 - 99 mg/dL   Comment 1 Notify RN     Comment 2 Documented in Chart    CBC     Status: Abnormal   Collection Time    08/14/13  3:55 AM      Result Value Range   WBC 19.6 (*) 4.0 - 10.5 K/uL   RBC 3.96 (*) 4.22 - 5.81 MIL/uL   Hemoglobin 13.1  13.0 - 17.0 g/dL   HCT 37.9 (*) 39.0 - 52.0 %   MCV 95.7  78.0 - 100.0 fL   MCH 33.1  26.0 - 34.0 pg   MCHC 34.6  30.0 - 36.0 g/dL   RDW 14.8  11.5 - 15.5 %   Platelets 325  150 - 400 K/uL  BASIC METABOLIC PANEL     Status: Abnormal   Collection Time    08/14/13  3:55 AM      Result Value Range   Sodium 130 (*) 135 - 145 mEq/L   Potassium 4.0  3.5 - 5.1 mEq/L   Chloride 91 (*) 96 - 112 mEq/L   CO2 26  19 - 32 mEq/L   Glucose, Bld 162 (*) 70 - 99 mg/dL   BUN 32 (*) 6 - 23 mg/dL   Creatinine, Ser 1.94 (*) 0.50 - 1.35 mg/dL   Calcium 9.8  8.4 - 10.5  mg/dL   GFR calc non Af Amer 36 (*) >90 mL/min   GFR calc Af Amer 42 (*) >90 mL/min   Comment: (NOTE)     The eGFR has been calculated using the CKD EPI equation.     This  calculation has not been validated in all clinical situations.     eGFR's persistently <90 mL/min signify possible Chronic Kidney     Disease.  GLUCOSE, CAPILLARY     Status: Abnormal   Collection Time    08/14/13  8:08 AM      Result Value Range   Glucose-Capillary 183 (*) 70 - 99 mg/dL   Comment 1 Notify RN     Comment 2 Documented in Chart    GLUCOSE, CAPILLARY     Status: Abnormal   Collection Time    08/14/13 12:22 PM      Result Value Range   Glucose-Capillary 230 (*) 70 - 99 mg/dL   Comment 1 Notify RN     Comment 2 Documented in Chart      Imaging / Studies: Ct Abdomen Pelvis Wo Contrast  08/14/2013   CLINICAL DATA:  Fever, leukocytosis, prior hernia repairs and appendectomy  EXAM: CT ABDOMEN AND PELVIS WITHOUT CONTRAST  TECHNIQUE: Multidetector CT imaging of the abdomen and pelvis was performed following the standard protocol without intravenous contrast.  COMPARISON:  None.  FINDINGS: Note: Given body habitus, the entire anterior abdominal wall (including a portion of the abnormality) could not be included in the field of view despite two separate attempts.  Prior ventral hernia mesh repair. Just anterior to the mesh, within the anterior abdominal wall, is an abdominal wall fluid collection/abscess measuring approximately 14.5 x 18.1 x 26.1 cm (incompletely visualized). Along the superior aspect of the fluid collection, gas extends to immediately beneath the skin surface (series 10/image 27). Bowel loops are displaced posteriorly by the large collection fluid without definite communication.  A small hernia containing fat and a loop of nondilated colon is present inferiorly (sagittal image 88), to the left of midline (series 10/image 81 and 86).  Minimal left basilar atelectasis.  Cardiomegaly. Coronary  atherosclerosis. ICD leads, incompletely visualized.  Moderate hepatic steatosis.  Unenhanced spleen and pancreas are unremarkable.  Low-density nodular thickening of the bilateral adrenal glands, likely reflecting adrenal adenomas.  Gallbladder is notable for layering gallstone. No intrahepatic or extrahepatic ductal dilatation.  Kidneys are unremarkable. No renal calculi or hydronephrosis.  No evidence of bowel obstruction. Prior appendectomy.  Atherosclerotic calcifications of the abdominal aorta and branch vessels.  No abdominopelvic ascites.  No suspicious abdominopelvic lymphadenopathy.  Prostate is unremarkable.  Bladder is within normal limits.  Degenerative changes of the visualized thoracolumbar spine.  IMPRESSION: 14.5 x 18.1 x 26.1 cm fluid collection/abscess within the anterior abdominal wall, as described above, incompletely visualized. The collection appears just anterior to prior ventral hernia mesh.  Given the size and proximity to the skin surface, incision and drainage is suggested.  These results were called by telephone at the time of interpretation on 08/14/2013 at 3:22 PM to Dr. Irine Seal , who verbally acknowledged these results.   Electronically Signed   By: Julian Hy M.D.   On: 08/14/2013 15:28   Dg Chest 2 View  08/01/2013   CLINICAL DATA:  Chest pain, shortness of breath.  EXAM: CHEST  2 VIEW  COMPARISON:  August 24, 2012.  FINDINGS: Stable cardiomegaly. Left-sided pacemaker is noted. No pleural effusion or pneumothorax is noted. Mild central pulmonary vascular congestion is noted which appears to be chronic. No acute pulmonary disease is noted.  IMPRESSION: No active cardiopulmonary disease.   Electronically Signed   By: Sabino Dick M.D.   On: 08/01/2013 17:06   Dg Hip Complete Right  08/11/2013   CLINICAL  DATA:  Right posterior hip pain.  EXAM: RIGHT HIP - COMPLETE 2+ VIEW  COMPARISON:  None.  FINDINGS: There is no evidence of hip fracture or dislocation. There is  no evidence of arthropathy or other focal bone abnormality.  IMPRESSION: No acute osseous injury of the right hip.   Electronically Signed   By: Kathreen Devoid   On: 08/11/2013 15:16   Ct Angio Chest Pe W/cm &/or Wo Cm  08/01/2013   CLINICAL DATA:  Shortness of Breath,  EXAM: CT ANGIOGRAPHY CHEST WITH CONTRAST  TECHNIQUE: Multidetector CT imaging of the chest was performed using the standard protocol during bolus administration of intravenous contrast. Multiplanar CT image reconstructions including MIPs were obtained to evaluate the vascular anatomy.  CONTRAST:  163mL OMNIPAQUE IOHEXOL 350 MG/ML SOLN  COMPARISON:  06/28/2012  FINDINGS: There is fairly good contrast opacification of the pulmonary artery branches with no convincing filling defects to suggest acute PE. There is good contrast opacification of the thoracic aorta with some scattered plaque, no evidence of dissection or stenosis. There is dilatation of the ascending aorta measuring up to 4.5 cm transverse diameter, stable by my measurement since prior study. Bovine brachiocephalic arterial origin anatomy without proximal stenosis. No pleural or pericardial effusion. No hilar or mediastinal adenopathy. Transvenous pacing leads are noted. Minimal linear scarring or subsegmental atelectasis in both lower lobes. Lungs otherwise clear. Visualized portions of upper abdomen unremarkable. Multiple bridging osteophytes throughout the mid and lower thoracic spine. Sternum intact.  Review of the MIP images confirms the above findings.  IMPRESSION: 1. 4.5 cm ascending aortic aneurysm without complicating features. 2. No evidence of acute pulmonary embolism or aortic dissection.   Electronically Signed   By: Arne Cleveland M.D.   On: 08/01/2013 19:55   Dg Chest Port 1 View  08/13/2013   CLINICAL DATA:  Leukocytosis.  EXAM: PORTABLE CHEST - 1 VIEW  COMPARISON:  08/01/2013  FINDINGS: No focal pulmonary consolidation is identified. There is stable mild pulmonary  venous prominence without overt edema or visualized pleural fluid. The heart remains mildly enlarged. There is stable appearance of a biventricular pacemaker.  IMPRESSION: No acute findings. Stable cardiomegaly and pulmonary venous prominence without overt edema.   Electronically Signed   By: Aletta Edouard M.D.   On: 08/13/2013 15:44    Medications / Allergies: per chart  Antibiotics: Anti-infectives   Start     Dose/Rate Route Frequency Ordered Stop   08/14/13 1600  piperacillin-tazobactam (ZOSYN) IVPB 3.375 g     3.375 g 12.5 mL/hr over 240 Minutes Intravenous Every 8 hours 08/14/13 1543        Assessment  Granvil A Larkin  62 y.o. male     Procedure(s): TRANSESOPHAGEAL ECHOCARDIOGRAM (TEE) CARDIOVERSION  Problem List:  Principal Problem:   Infected prosthetic mesh of abdominal wall with GIANT abscess Active Problems:   Obesity, Class III, BMI 40-49.9 (morbid obesity)   DM   HYPERLIPIDEMIA   OBSTRUCTIVE SLEEP APNEA   Hypertension   Acute-on-chronic respiratory failure secondary to probable community-acquired pneumonia   Fever   Unspecified constipation   Chronic combined systolic and diastolic CHF (congestive heart failure)   Right leg pain   Leukocytosis   AAA (abdominal aortic aneurysm)/ 4.5 cm ascending per CT angio 08/01/13   Recurrent ventral incisional hernia   Giant abdominal abscess sitting on mash concerning for infected mesh In a morbidly obese, poorly controlled diabetic, recently-just-stopped-smoking patient.  Recurrent hernia with colon in it.  Gas noted.  Possible colonic  perforation due to mesh erosion.  Plan:  Intravenous antibiotics.  Emergent operation with drainage of abscess.  Mesh will have to be removed at some point.  Will need biologic mesh as a bridge.  We will probably develop recurrent hernia and require major hernia repair and a possible panniculectomy given his giant panniculus.  I discussed with the patient and his family.  I warned him  this will take multiple operations over the next year or so to get this under control.  If he has bowel perforation, he will require resection and possible ostomy again:  The anatomy & physiology of the digestive tract was discussed.  The pathophysiology of perforation was discussed.  Differential diagnosis such as perforated ulcer or colon, etc was discussed.   Natural history risks without surgery such as death was discussed.  I recommended abdominal exploration to diagnose & treat the source of the problem.  Laparoscopic & open techniques were discussed.   Risks such as bleeding, infection, abscess, leak, reoperation, bowel resection, possible ostomy, hernia, heart attack, death, and other risks were discussed.   The risks of no intervention will lead to serious problems including death.   I expressed a good likelihood that surgery will address the problem.    Goals of post-operative recovery were discussed as well.  We will work to minimize complications although risks in an emergent setting are high.   Questions were answered.  The patient expressed understanding & wishes to proceed with surgery.      Recent anticoagulation with Eliquis.  Increased bleeding risk.  I do not think he can wait another day or so as this is quite massive for an abscess and he has obvious abdominal wall cellulitis erythema and now gas.  High risk for necrotizing fasciitis if it has not already.  Cardiology has cleared him for what it is worth.  Probable step down unit monitoring.  Aggressive diabetic control.  Poor hemoglobin A1c a sign of fair chronic control.  Glucose is less than 160-320s, so trying to get on top of that.  Hopefully will be easier to manage when abscess is drained.  Pulmonary support with CPAP for sleep apnea.  -VTE prophylaxis- SCDs, etc  -mobilize as tolerated to help recovery.  Challenge with recent fall and right hip strain.  No strong evidence of fracture know.  Involve PT/OT.  I agree he  may need skilled nursing support/placement.    [name], M.D., F.A.C.S. Gastrointestinal and Minimally Invasive Surgery [facility] [address], [place], [place] [address] [phone] Main / Paging   08/14/2013

## 2013-08-14 NOTE — Progress Notes (Signed)
Provided Pt with bed offers.  Pt to review with wife and have a decision by Monday.  Bernita Raisin, [place] [facility] [phone]

## 2013-08-14 NOTE — Progress Notes (Signed)
ANTIBIOTIC CONSULT NOTE - INITIAL  Pharmacy Consult for Zosyn Indication: intra-abdominal coverage  No Known Allergies  Patient Measurements: Height: 6' (182.9 cm) Weight: 345 lb 0.3 oz (156.5 kg) IBW/kg (Calculated) : 77.6  Vital Signs: Temp: 98.9 F (37.2 C) (11/08 0540) Temp src: Oral (11/08 0540) BP: 127/60 mmHg (11/08 1016) Pulse Rate: 92 (11/08 0800)  Labs:  Recent Labs  08/12/13 0655 08/13/13 0512 08/14/13 0355  WBC 18.8* 18.4* 19.6*  HGB 14.0 13.3 13.1  PLT 394 363 325  CREATININE 1.41* 1.52* 1.94*    Medical History: Past Medical History  Diagnosis Date  . Sleep apnea   . Diabetes mellitus   . Hypertension   . Hyperlipidemia   . CHF (congestive heart failure)   . Hernia   . AAA (abdominal aortic aneurysm)/ 4.5 cm ascending per CT angio 08/01/13 08/12/2013     Assessment: 52 yoM presented 11/5 with c/o R leg pain and inability to ambulate s/p fall. Pt was recently hospitalized for CAP, discharged 10/30 and reportedly completed his course of abx with Levaquin 1 day PTA.  Leukocytosis noted on admission, no clear source of infection so abx not started.  On 11/7, pt with fever of 102.4 and abdominal distention and erythema. CT abd showed 14.5 x 18.1 x 26.1 cm fluid collection/abscess within the anterior abd wall - incision and drainage is recommended. MD order Zosyn for empiric abd-coverage.  11/8 >> Zosyn >>   Tmax: 102.1  WBCs: 19.6K Renal: Scr 1.94, CG 63 m/min  11/7 urine >> in process 11/7 blood >> NGTD  Plan:   Zosyn 3.375 gm IV q8h (extended infusion over 4 hours)  Pharmacy will f/u  Vanessa Rockford, PharmD, BCPS Pager: [phone] 3:54 PM Pharmacy #: [id number]

## 2013-08-14 NOTE — Progress Notes (Signed)
Subjective:  Patient had atrial flutter previous admission. Now has fever, abdominal distension and erythema of umbilical area skin.  Objective:  Vital Signs in the last 24 hours: Temp:  [98.6 F (37 C)-102.1 F (38.9 C)] 98.9 F (37.2 C) (11/08 0540) Pulse Rate:  [68-92] 92 (11/08 0800) Cardiac Rhythm:  [-] Ventricular paced;Atrial flutter (11/08 0800) Resp:  [18-20] 18 (11/08 0540) BP: (106-133)/(50-71) 127/60 mmHg (11/08 1016) SpO2:  [93 %-98 %] 98 % (11/08 0540) Weight:  [156.5 kg (345 lb 0.3 oz)] 156.5 kg (345 lb 0.3 oz) (11/08 0540)  Physical Exam: BP Readings from Last 1 Encounters:  08/14/13 127/60     Wt Readings from Last 1 Encounters:  08/14/13 156.5 kg (345 lb 0.3 oz)    Weight change: 0.5 kg (1 lb 1.6 oz)  HEENT: [place]/AT, Eyes-Brown, Conjunctiva-Pink, Sclera-Non-icteric Neck: No JVD, No bruit, Trachea midline. Lungs:  Clearing, Bilateral. Cardiac:  Regular rhythm, normal S1 and S2, no S3.  Abdomen:  Soft, epigastric and umbilical area tender. Skin tight and red over umbilical area large ventral hernia. Extremities:  No edema present. No cyanosis. No clubbing. CNS: AxOx3, Cranial nerves grossly intact, moves all 4 extremities.  Skin: Warm and dry.   Intake/Output from previous day: 11/07 0701 - 11/08 0700 In: 720 [P.O.:720] Out: 500 [Urine:500]    Lab Results: BMET    Component Value Date/Time   NA 130* 08/14/2013 0355   K 4.0 08/14/2013 0355   CL 91* 08/14/2013 0355   CO2 26 08/14/2013 0355   GLUCOSE 162* 08/14/2013 0355   BUN 32* 08/14/2013 0355   CREATININE 1.94* 08/14/2013 0355   CALCIUM 9.8 08/14/2013 0355   GFRNONAA 36* 08/14/2013 0355   GFRAA 42* 08/14/2013 0355   CBC    Component Value Date/Time   WBC 19.6* 08/14/2013 0355   RBC 3.96* 08/14/2013 0355   HGB 13.1 08/14/2013 0355   HCT 37.9* 08/14/2013 0355   PLT 325 08/14/2013 0355   MCV 95.7 08/14/2013 0355   MCH 33.1 08/14/2013 0355   MCHC 34.6 08/14/2013 0355   RDW 14.8 08/14/2013 0355   LYMPHSABS  1.3 08/13/2013 0512   MONOABS 2.1* 08/13/2013 0512   EOSABS 0.2 08/13/2013 0512   BASOSABS 0.0 08/13/2013 0512   CARDIAC ENZYMES Lab Results  Component Value Date   CKTOTAL 62 01/02/2010   CKMB 2.8 06/11/2007   TROPONINI <0.30 08/12/2013    Scheduled Meds: . apixaban  5 mg Oral BID  . atorvastatin  40 mg Oral QODAY  . carvedilol  6.25 mg Oral BID WC  . docusate sodium  100 mg Oral BID  . furosemide  40 mg Oral BID WC  . insulin aspart  0-20 Units Subcutaneous TID WC  . insulin aspart  8 Units Subcutaneous TID WC  . insulin glargine  50 Units Subcutaneous QHS  . sodium chloride  3 mL Intravenous Q12H   Continuous Infusions:  PRN Meds:.sodium chloride, acetaminophen, acetaminophen, alum & mag hydroxide-simeth, morphine injection, ondansetron (ZOFRAN) IV, ondansetron, oxyCODONE, oxyCODONE-acetaminophen, polyethylene glycol, sodium chloride, sorbitol  Assessment/Plan: Chronic atrial flutter with controlled ventricular response  Mild coronary artery disease  Mild nonischemic dilated cardiomyopathy  S/P ICD Compensated systolic heart failure  Hypertension  Insulin requiring diabetes mellitus  Morbid obesity  Hypercholesteremia  Obstructive sleep apnea/obesity hypoventilation syndrome  Chronic kidney disease  Marked leukocytosis etiology unclear  Possible infected ventral umbilical hernia skin Status post fall  Awaiting CT abdomen Awaiting TEE/Cardioversion on Monday.   LOS: 3 days  Dixie Dials  MD  08/14/2013, 1:44 PM

## 2013-08-14 NOTE — Transfer of Care (Signed)
Immediate Anesthesia Transfer of Care Note  Patient: Kurt Cross  Procedure(s) Performed: Procedure(s): INCISION AND DRAINAGE ABSCESS, possible mesh removal, possible colectomy, colostomy (N/A)  Patient Location: PACU  Anesthesia Type:General  Level of Consciousness: awake, alert , oriented and patient cooperative  Airway & Oxygen Therapy: Patient Spontanous Breathing and Patient connected to face mask oxygen  Post-op Assessment: Report given to PACU RN, Post -op Vital signs reviewed and stable and Patient moving all extremities  Post vital signs: Reviewed and stable  Complications: No apparent anesthesia complications

## 2013-08-14 NOTE — Progress Notes (Signed)
Pt had a fever of 102.4 over night shift. 650mg  of tylenol given. Will continue to monitor.

## 2013-08-14 NOTE — Progress Notes (Signed)
Was called by radiologist Dr. Maryland Pink stating that CT of patient's abdomen and pelvis were showing a 14 cm x 18 cm x 26 cm abdominal wall abscess. Called and spoke with Dr. gross per general surgery for surgical consultation. Will discontinue patient's eliquis and start patient on empiric IV Zosyn. Spoke with Dr. Doylene Canard of cardiology who stated that patient was acceptable for surgery without any additional testing needed.

## 2013-08-14 NOTE — Op Note (Addendum)
08/11/2013 - 08/14/2013  7:58 PM  PATIENT:  Kurt Cross  62 y.o. male  Patient Care Team: [name], MD as PCP - General (Internal Medicine) [name], MD as Consulting Physician (Cardiology) [name], MD as Consulting Physician (Pulmonary Disease)  PRE-OPERATIVE DIAGNOSIS:  Abdominal abscess, probable infected mesh  POST-OPERATIVE DIAGNOSIS:  GIANT abdominal abscess with infected old GoreTex mesh  PROCEDURE:  Procedure(s): INCISION AND DRAINAGE ABSCESS GoreTex mesh removal Debridement of abdominal wall & peritoneum  SURGEON:  Surgeon(s): [name], MD  ASSISTANT: Nedra Hai, MD   ANESTHESIA:   general  EBL:  Total I/O In: 1300 [I.V.:1300] Out: [id number] [Other:3000; Blood:110]  Delay start of Pharmacological VTE agent (>24hrs) due to surgical blood loss or risk of bleeding:  no  DRAINS: none   SPECIMEN:  Source of Specimen:  1.  Abscess for culture  2.  Old GoreTex mesh  DISPOSITION OF SPECIMEN:  PATHOLOGY & Micro  COUNTS:  YES  PLAN OF CARE: Admit to inpatient   PATIENT DISPOSITION:  ICU - extubated and stable.  INDICATION: Morbidly obese male with numerous prior abdominal surgeries.  Had open [place] repair w mesh decades ago.  Noted swelling in abdomen 1 months ago.  Worsening fevers.  Tx for PNA but returned.  Abdomnal swelling worse w redness.  CT today shows giant abscess.  I recommended surgery:  The anatomy & physiology of the digestive tract was discussed.  The pathophysiology of perforation was discussed.  Differential diagnosis such as perforated ulcer or colon, etc was discussed.   Natural history risks without surgery such as death was discussed.  I recommended abdominal exploration to diagnose & treat the source of the problem.  Open techniques were discussed.   Risks such as bleeding, infection, abscess, leak, reoperation, bowel resection, possible ostomy, hernia, heart attack, death, and other risks were discussed.   The risks of no  intervention will lead to serious problems including death.   I expressed a good likelihood that surgery will address the problem.  Multiple surgeries needed   Goals of post-operative recovery were discussed as well.  We will work to minimize complications although risks in an emergent setting are high.   Questions were answered.  The patient expressed understanding & wishes to proceed with surgery.      OR FINDINGS:   60x50cm abd wall erythema  3L giant abscess, 90% deep to old GoreTex mesh.  No evidence of intestinal/colonic perforation.  Wound measures 30cm by 20cm.  It is 5-10cm deep.  Undermining 5-8cm tunneled pockets at apex & base & RLQ   DESCRIPTION:   Informed consent was confirmed. The patient received IV antibiotics. The patient underwent general anesthesia without any difficulty. The patient was positioned supine.  SCDs were active during the entire case. The area around the abscess was prepped and draped in a sterile fashion. A surgical timeout confirmed our plan.   I aspirated 5 mL of frank pus over the most fluctuant area of the mass.  I. Open up a small incision encountered a giant long of pus which I aspirated.  Encountered obvious mesh floating in the vault pus.  Looked like Gore-Tex.  I extended the incision.  It was a large sheet of Gore-Tex mesh.  I freed it from the Prolene stitches around the edge.  I encountered a massive volume of pus deeper to that.  In and up sucking out 3000 mL.  I removed all stitches and the Gore-Tex mesh.  There  were some dense adherence a teases to the subcutaneous tissues on the Gore-Tex mesh.  I later excised that off to leave no more mesh remaining.  He had a fair amount of necrotic granulation in the abdominal wall, on top of the peritoneum. and frozen small bowel.  I carefully washed this off and irrigated.  There was purulence extruding from the right lower quadrant along his old appendectomy incision.  I did a 5 cm counter incision over  that area and connected it.  Found some Prolene stitches which I removed.  I could bluntly enter a narrow tunnel almost up to the xiphoid at the apex of the wound.  I can enter another wound around the umbilicus at the base of the wound with his giant panniculus.  I encountered no stool or succus.  We pulse lavaged with several liters of saline on the abdominal wall and more gently on the peritoneum.  Encountered healthy bleeding tissue.     We suspected small bowel was exposed but the abdomen was completely frozen with a thick layer of chronic granulation.  There is no anything could eviscerate.  I did not trust I had good fascia to stitch to.  In the setting of acute inflammation with the patient already on full anticoagulation, I decided not do anything more aggressive at this time.  Dr. Ninfa Linden agreed.  We decided to pack the wound since it had been frankly abscessed with necrotic tissue.  I used 4 large Kerlixes for the center part of the wound and a rolled Kling gauze for the right lateral aspect of the wound.  Sterile dressings applied.  Patient is extubated in the recovery room.   We plan to continue IV antibiotics and begin wound care training tomorrow.   I updated the patient's status to the family.  Recommendations were made.  Questions were answered.  The family expressed understanding & appreciation.

## 2013-08-14 NOTE — Anesthesia Preprocedure Evaluation (Addendum)
Anesthesia Evaluation  Patient identified by MRN, date of birth, ID band Patient awake    Reviewed: Allergy & Precautions, H&P , Patient's Chart, lab work & pertinent test results, reviewed documented beta blocker date and time   Airway Mallampati: IV TM Distance: >3 FB Neck ROM: full   Comment: Poor condition of remaining teeth Dental no notable dental hx.    Pulmonary sleep apnea ,  breath sounds clear to auscultation  Pulmonary exam normal       Cardiovascular hypertension, Pt. on medications and Pt. on home beta blockers +CHF + Cardiac Defibrillator Rhythm:regular Rate:Normal     Neuro/Psych    GI/Hepatic   Endo/Other  diabetes, Type 2  Renal/GU      Musculoskeletal   Abdominal   Peds  Hematology   Anesthesia Other Findings Morbid Obesity Sleep apnea      Diabetes mellitus     BS on arrival to or around 220   Hypertension  Took meds today      Hyperlipidemia  Atrial flutter, taking BBlockers for reate control; received 2 doses of Eliquis ( discussed ordering platelets with Dr Johney Maine; he plans to only drain abscess, and should not experience much blood loss         CHF (congestive heart failure) EF 50% last echo in Nov      Hernia        AAA (abdominal aortic aneurysm)/ 4.5 cm ascending per CT angio 08/01/13 08/12/2013        Reproductive/Obstetrics                         Anesthesia Physical Anesthesia Plan  ASA: III and emergent  Anesthesia Plan: General   Post-op Pain Management:    Induction: Intravenous  Airway Management Planned: Oral ETT and Video Laryngoscope Planned  Additional Equipment:   Intra-op Plan:   Post-operative Plan: Extubation in OR  Informed Consent: I have reviewed the patients History and Physical, chart, labs and discussed the procedure including the risks, benefits and alternatives for the proposed anesthesia with the patient or authorized  representative who has indicated his/her understanding and acceptance.   Dental Advisory Given  Plan Discussed with: CRNA and Surgeon  Anesthesia Plan Comments: (Dental injury enforced  Discussed general anesthesia, including possible nausea, instrumentation of airway, sore throat,pulmonary aspiration, etc. I asked if the were any outstanding questions, or  concerns before we proceeded. )       Anesthesia Quick Evaluation

## 2013-08-14 NOTE — Progress Notes (Signed)
Spoke with patient in regards to CPAP HS, patient verifies compliance at home--however does not want to wear CPAP tonight. Currently refuses. Patient verbalized understanding that if this decision changes RT available for assistance. Pt remains on oxygen via nasal cannula. Vss.

## 2013-08-14 NOTE — Progress Notes (Signed)
Call from RN, patients Spo2 dropped below 88%--spoke with patient, agrees to proceed with CPAP. Placed on an auto titration mode with 02 bled in. Tolerating well.

## 2013-08-14 NOTE — Anesthesia Postprocedure Evaluation (Signed)
  Anesthesia Post Note  Patient: Kurt Cross  Procedure(s) Performed: Procedure(s) (LRB): INCISION AND DRAINAGE ABSCESS, possible mesh removal, possible colectomy, colostomy (N/A)  Anesthesia type: GA  Patient location: PACU  Post pain: Pain level controlled  Post assessment: Post-op Vital signs reviewed  Last Vitals:  Filed Vitals:   08/14/13 2100  BP: 98/59  Pulse: 73  Temp:   Resp: 25    Post vital signs: Reviewed  Level of consciousness: sedated  Complications: No apparent anesthesia complications

## 2013-08-14 NOTE — Preoperative (Signed)
Beta Blockers   Reason not to administer Beta Blockers:Coreg taken 08-14-13 at 0829 6.25 mg

## 2013-08-15 ENCOUNTER — Inpatient Hospital Stay (HOSPITAL_COMMUNITY): Payer: Medicare Other

## 2013-08-15 ENCOUNTER — Encounter (HOSPITAL_COMMUNITY): Payer: Self-pay | Admitting: Internal Medicine

## 2013-08-15 DIAGNOSIS — T8579XA Infection and inflammatory reaction due to other internal prosthetic devices, implants and grafts, initial encounter: Principal | ICD-10-CM

## 2013-08-15 DIAGNOSIS — I959 Hypotension, unspecified: Secondary | ICD-10-CM

## 2013-08-15 DIAGNOSIS — L02219 Cutaneous abscess of trunk, unspecified: Secondary | ICD-10-CM

## 2013-08-15 DIAGNOSIS — N179 Acute kidney failure, unspecified: Secondary | ICD-10-CM

## 2013-08-15 DIAGNOSIS — B9689 Other specified bacterial agents as the cause of diseases classified elsewhere: Secondary | ICD-10-CM

## 2013-08-15 HISTORY — DX: Acute kidney failure, unspecified: N17.9

## 2013-08-15 LAB — COMPREHENSIVE METABOLIC PANEL
Albumin: 1.9 g/dL — ABNORMAL LOW (ref 3.5–5.2)
BUN: 36 mg/dL — ABNORMAL HIGH (ref 6–23)
Calcium: 8.7 mg/dL (ref 8.4–10.5)
Creatinine, Ser: 1.93 mg/dL — ABNORMAL HIGH (ref 0.50–1.35)
GFR calc Af Amer: 42 mL/min — ABNORMAL LOW (ref >90)
Glucose, Bld: 171 mg/dL — ABNORMAL HIGH (ref 70–99)
Potassium: 3.5 mEq/L (ref 3.5–5.1)
Sodium: 130 mEq/L — ABNORMAL LOW (ref 135–145)
Total Protein: 6.3 g/dL (ref 6.0–8.3)

## 2013-08-15 LAB — GLUCOSE, CAPILLARY
Glucose-Capillary: 171 mg/dL — ABNORMAL HIGH (ref 70–99)
Glucose-Capillary: 198 mg/dL — ABNORMAL HIGH (ref 70–99)
Glucose-Capillary: 203 mg/dL — ABNORMAL HIGH (ref 70–99)
Glucose-Capillary: 261 mg/dL — ABNORMAL HIGH (ref 70–99)
Glucose-Capillary: 279 mg/dL — ABNORMAL HIGH (ref 70–99)

## 2013-08-15 LAB — URINALYSIS, ROUTINE W REFLEX MICROSCOPIC
Leukocytes, UA: NEGATIVE
Nitrite: NEGATIVE
Specific Gravity, Urine: 1.025 (ref 1.005–1.030)
Urobilinogen, UA: 1 mg/dL (ref 0.0–1.0)
pH: 5.5 (ref 5.0–8.0)

## 2013-08-15 LAB — BASIC METABOLIC PANEL
BUN: 36 mg/dL — ABNORMAL HIGH (ref 6–23)
Creatinine, Ser: 1.98 mg/dL — ABNORMAL HIGH (ref 0.50–1.35)
GFR calc Af Amer: 41 mL/min — ABNORMAL LOW (ref >90)
GFR calc non Af Amer: 35 mL/min — ABNORMAL LOW (ref >90)
Glucose, Bld: 207 mg/dL — ABNORMAL HIGH (ref 70–99)

## 2013-08-15 LAB — URINE CULTURE: Colony Count: NO GROWTH

## 2013-08-15 LAB — URINE MICROSCOPIC-ADD ON

## 2013-08-15 LAB — CBC
HCT: 34.3 % — ABNORMAL LOW (ref 39.0–52.0)
HCT: 34.2 % — ABNORMAL LOW (ref 39.0–52.0)
Hemoglobin: 11.7 g/dL — ABNORMAL LOW (ref 13.0–17.0)
Hemoglobin: 11.6 g/dL — ABNORMAL LOW (ref 13.0–17.0)
MCH: 32.3 pg (ref 26.0–34.0)
MCH: 32 pg (ref 26.0–34.0)
MCHC: 34.1 g/dL (ref 30.0–36.0)
MCHC: 33.9 g/dL (ref 30.0–36.0)
MCV: 94.2 fL (ref 78.0–100.0)
Platelets: 324 10*3/uL (ref 150–400)
RBC: 3.63 MIL/uL — ABNORMAL LOW (ref 4.22–5.81)
RDW: 14.7 % (ref 11.5–15.5)
RDW: 14.6 % (ref 11.5–15.5)

## 2013-08-15 LAB — TYPE AND SCREEN
ABO/RH(D): O POS
Antibody Screen: NEGATIVE

## 2013-08-15 LAB — PROTIME-INR: INR: 1.48 (ref 0.00–1.49)

## 2013-08-15 LAB — FIBRINOGEN: Fibrinogen: >800 mg/dL — ABNORMAL HIGH (ref 204–475)

## 2013-08-15 LAB — APTT: aPTT: 43 seconds — ABNORMAL HIGH (ref 24–37)

## 2013-08-15 LAB — ABO/RH: ABO/RH(D): O POS

## 2013-08-15 LAB — LACTIC ACID, PLASMA: Lactic Acid, Venous: 1.4 mmol/L (ref 0.5–2.2)

## 2013-08-15 LAB — CLOSTRIDIUM DIFFICILE BY PCR: Toxigenic C. Difficile by PCR: NEGATIVE

## 2013-08-15 SURGERY — Surgical Case
Anesthesia: *Unknown

## 2013-08-15 MED ORDER — PSYLLIUM 95 % PO PACK
1.0000 | PACK | Freq: Every day | ORAL | Status: DC
  Administered 2013-08-15 – 2013-08-31 (×12): 1 via ORAL
  Filled 2013-08-15 (×17): qty 1

## 2013-08-15 MED ORDER — KETOROLAC TROMETHAMINE 15 MG/ML IJ SOLN
15.0000 mg | Freq: Four times a day (QID) | INTRAMUSCULAR | Status: AC
  Administered 2013-08-15 (×2): 15 mg via INTRAVENOUS
  Filled 2013-08-15 (×2): qty 1

## 2013-08-15 MED ORDER — VANCOMYCIN HCL 10 G IV SOLR
2000.0000 mg | Freq: Once | INTRAVENOUS | Status: AC
  Administered 2013-08-15: 2000 mg via INTRAVENOUS
  Filled 2013-08-15: qty 2000

## 2013-08-15 MED ORDER — VANCOMYCIN HCL 10 G IV SOLR
2000.0000 mg | INTRAVENOUS | Status: DC
  Administered 2013-08-16 – 2013-08-30 (×15): 2000 mg via INTRAVENOUS
  Filled 2013-08-15 (×17): qty 2000

## 2013-08-15 MED ORDER — INSULIN ASPART 100 UNIT/ML ~~LOC~~ SOLN: 0.0000 [IU] | SUBCUTANEOUS | Status: DC

## 2013-08-15 MED ORDER — DEXTROSE 5 % IV SOLN
5.0000 ug/min | INTRAVENOUS | Status: DC
  Administered 2013-08-15: 5 ug/min via INTRAVENOUS
  Filled 2013-08-15: qty 4

## 2013-08-15 MED ORDER — SODIUM CHLORIDE 0.9 % IV BOLUS (SEPSIS): 1000.0000 mL | INTRAVENOUS | Status: DC | PRN

## 2013-08-15 MED ORDER — SODIUM CHLORIDE 0.9 % IV SOLN
INTRAVENOUS | Status: DC
  Administered 2013-08-15: 03:00:00 via INTRAVENOUS
  Administered 2013-08-16: 100 mL via INTRAVENOUS
  Administered 2013-08-16: 08:00:00 via INTRAVENOUS

## 2013-08-15 MED ORDER — VITAMINS A & D EX OINT
TOPICAL_OINTMENT | CUTANEOUS | Status: AC
  Administered 2013-08-15: 1
  Filled 2013-08-15: qty 5

## 2013-08-15 MED ORDER — INSULIN ASPART 100 UNIT/ML ~~LOC~~ SOLN
0.0000 [IU] | SUBCUTANEOUS | Status: DC
  Administered 2013-08-15 (×2): 4 [IU] via SUBCUTANEOUS
  Administered 2013-08-15: 7 [IU] via SUBCUTANEOUS
  Administered 2013-08-15 (×2): 11 [IU] via SUBCUTANEOUS
  Administered 2013-08-15 – 2013-08-16 (×6): 4 [IU] via SUBCUTANEOUS
  Administered 2013-08-17 (×2): 3 [IU] via SUBCUTANEOUS
  Administered 2013-08-17: 4 [IU] via SUBCUTANEOUS
  Administered 2013-08-17: 14:00:00 via SUBCUTANEOUS
  Administered 2013-08-17: 3 [IU] via SUBCUTANEOUS

## 2013-08-15 NOTE — Progress Notes (Signed)
Kurt Lynch,NP at bedside, 1L bolus in, ordered to give another 1L NS bolus and Phase 1 Sepsis Protocol started at [place] after [place] talked with PCCM.

## 2013-08-15 NOTE — Progress Notes (Signed)
Kurt Cross [id number] 12-15-1950  CARE TEAM:  PCP: [name], MD  Outpatient Care Team: Patient Care Team: [name], MD as PCP - General (Internal Medicine) [name], MD as Consulting Physician (Cardiology) [name], MD as Consulting Physician (Pulmonary Disease)  Inpatient Treatment Team: Treatment Team: Attending Provider: [name], MD; Rounding Team: Redmond Baseman, MD; Technician: Olevia Bowens, [place]; Technician: Coralie Carpen, NT; Consulting Physician: [name], MD; Technician: Harlen Labs, NT; Consulting Physician: Nolon Nations, MD; Consulting Physician: Md Pccm, MD; Registered Nurse: Cindy Hazy, RN   Subjective:  Sore but pain medicines helping Relented on using CPAP Low - IVF boluses helped.  Pressors not needed yet  Objective:  Vital signs:  Filed Vitals:   08/15/13 0600 08/15/13 0630 08/15/13 0700 08/15/13 0800  BP: 99/71 103/79 101/52 105/72  Pulse: 62 59 66 60  Temp: 99.1 F (37.3 C) 99.5 F (37.5 C) 99.1 F (37.3 C) 97.9 F (36.6 C)  TempSrc:      Resp: 18 20 14 16   Height:      Weight:      SpO2: 100% 100% 100% 98%    Last BM Date: 08/13/13  Intake/Output   Yesterday:  11/08 0701 - 11/09 0700 In: [id number] [P.O.:1010; I.V.:2160; IV Piggyback:3050] Out: [id number]; Blood:110] This shift:     Bowel function:  Flatus: y  BM: large loose BMs  Drain: n/a  Physical Exam:  General: Pt awake/alert/oriented x4 in mild acute distress Eyes: PERRL, normal EOM.  Sclera clear.  No icterus Neuro: CN II-XII intact w/o focal sensory/motor deficits. Lymph: No head/neck/groin lymphadenopathy Psych:  No delerium/psychosis/paranoia HENT: Normocephalic, Mucus membranes moist.  No thrush Neck: Supple, No tracheal deviation Chest: No chest wall pain w good excursion CV:  Pulses intact.  Regular rhythm MS: Normal AROM mjr joints.  No obvious deformity Abdomen: Soft.  Nondistended.  Mildly tender at abd wound  only.  No evidence of peritonitis.  No incarcerated hernias. Ext:  SCDs BLE.  No mjr edema.  No cyanosis Skin: No petechiae / purpura   Problem List:   Principal Problem:   Infected prosthetic mesh of abdominal wall with GIANT abscess Active Problems:   Obesity, Class III, BMI 40-49.9 (morbid obesity)   Diabetes mellitus, insulin dependent (IDDM), uncontrolled   HYPERLIPIDEMIA   OBSTRUCTIVE SLEEP APNEA   Hypertension   Acute-on-chronic respiratory failure secondary to probable community-acquired pneumonia   Fever   Sepsis   Constipation, chronic   Chronic combined systolic and diastolic CHF (congestive heart failure)   Right leg pain   Leukocytosis   AAA (abdominal aortic aneurysm)/ 4.5 cm ascending per CT angio 08/01/13   Recurrent ventral incisional hernia   Assessment  Kurt Cross  62 y.o. male  1 Day Post-Op  Procedure(s): POST-OPERATIVE DIAGNOSIS: GIANT abdominal abscess with infected old GoreTex mesh  PROCEDURE: Procedure(s):  INCISION AND DRAINAGE ABSCESS  GoreTex mesh removal  Debridement of abdominal wall & peritoneum  SURGEON: Surgeon(s):  [name], MD   Shock stabilizing  Plan:  -Wound care - BID packing & probably switch to wound vac.  Will need white/bowel protective sponges w SB exposed.  Cont abd binder  -IV ABx - f/u Cxs  -Tx shock w volume short term, then adjust - challenge w h/o OSA & CHF in avoidng overload vol  -ARF - ?ATN from shock - + on I&O so follow w volume.  Defer to med  -aggressive glc control.  Consider IV gtt if not better.  I inc lantus &  Yest....  Hopefully will be better w infection Tx'd  -VTE prophylaxis- SCDs, etc  -mobilize as tolerated to help recovery.  Wear binder while up  [name], M.D., F.A.C.S. Gastrointestinal and [facility]ally [facility]vasive [facility] [address] [place], [place] [address] [phone] Main / Paging   08/15/2013   Results:    Labs: Results for orders placed during the hospital encounter of 08/11/13 (from the past 48 hour(s))  GLUCOSE, CAPILLARY     Status: Abnormal   Collection Time    08/13/13 11:51 AM      Result Value Range   Glucose-Capillary 188 (*) 70 - 99 mg/dL   Comment 1 Notify RN     Comment 2 Documented in Chart    CULTURE, BLOOD (ROUTINE X 2)     Status: None   Collection Time    08/13/13  2:55 PM      Result Value Range   Specimen Description BLOOD LEFT HAND     Special Requests BOTTLES DRAWN AEROBIC ONLY 10CC     Culture  Setup Time       Value: 08/13/2013 22:30     Performed at [facility]   Culture       Value:        BLOOD CULTURE RECEIVED NO GROWTH TO DATE CULTURE WILL BE HELD FOR 5 DAYS BEFORE ISSUING A FINAL NEGATIVE REPORT     Performed at [facility]   Report Status PENDING    CULTURE, BLOOD (ROUTINE X 2)     Status: None   Collection Time    08/13/13  3:00 PM      Result Value Range   Specimen Description BLOOD LEFT ARM     Special Requests BOTTLES DRAWN AEROBIC AND ANAEROBIC 10CC     Culture  Setup Time       Value: 08/13/2013 22:31     Performed at [facility]   Culture       Value:        BLOOD CULTURE RECEIVED NO GROWTH TO DATE CULTURE WILL BE HELD FOR 5 DAYS BEFORE ISSUING A FINAL NEGATIVE REPORT     Performed at [facility]   Report Status PENDING    LACTIC ACID, PLASMA     Status: None   Collection Time    08/13/13  3:02 PM      Result Value Range   Lactic Acid, Venous 2.0  0.5 - 2.2 mmol/L  URINALYSIS, ROUTINE W REFLEX MICROSCOPIC     Status: Abnormal   Collection Time    08/13/13  3:11 PM      Result Value Range   Color, Urine ORANGE (*) YELLOW   Comment: BIOCHEMICALS MAY BE AFFECTED BY COLOR   APPearance CLOUDY (*) CLEAR   Specific Gravity, Urine 1.026  1.005 - 1.030   pH 5.0  5.0 - 8.0   Glucose, UA NEGATIVE  NEGATIVE mg/dL   Hgb urine dipstick NEGATIVE  NEGATIVE   Bilirubin Urine SMALL (*) NEGATIVE   Ketones, ur  NEGATIVE  NEGATIVE mg/dL   Protein, ur 30 (*) NEGATIVE mg/dL   Urobilinogen, UA 1.0  0.0 - 1.0 mg/dL   Nitrite NEGATIVE  NEGATIVE   Leukocytes, UA SMALL (*) NEGATIVE  URINE MICROSCOPIC-ADD ON     Status: Abnormal   Collection Time    08/13/13  3:11 PM  Result Value Range   WBC, UA 0-2  <3 WBC/hpf   Casts HYALINE CASTS (*) NEGATIVE  GLUCOSE, CAPILLARY     Status: Abnormal   Collection Time    08/13/13  4:59 PM      Result Value Range   Glucose-Capillary 238 (*) 70 - 99 mg/dL   Comment 1 Notify RN     Comment 2 Documented in Chart    HEPATIC FUNCTION PANEL     Status: Abnormal   Collection Time    08/13/13  8:07 PM      Result Value Range   Total Protein 7.1  6.0 - 8.3 g/dL   Albumin 2.2 (*) 3.5 - 5.2 g/dL   AST 80 (*) 0 - 37 U/L   ALT 37  0 - 53 U/L   Alkaline Phosphatase 53  39 - 117 U/L   Total Bilirubin 0.9  0.3 - 1.2 mg/dL   Bilirubin, Direct 0.5 (*) 0.0 - 0.3 mg/dL   Indirect Bilirubin 0.4  0.3 - 0.9 mg/dL  PROTIME-INR     Status: Abnormal   Collection Time    08/13/13  8:09 PM      Result Value Range   Prothrombin Time 16.0 (*) 11.6 - 15.2 seconds   INR 1.31  0.00 - 1.49  GLUCOSE, CAPILLARY     Status: Abnormal   Collection Time    08/13/13  9:23 PM      Result Value Range   Glucose-Capillary 162 (*) 70 - 99 mg/dL   Comment 1 Notify RN     Comment 2 Documented in Chart    CBC     Status: Abnormal   Collection Time    08/14/13  3:55 AM      Result Value Range   WBC 19.6 (*) 4.0 - 10.5 K/uL   RBC 3.96 (*) 4.22 - 5.81 MIL/uL   Hemoglobin 13.1  13.0 - 17.0 g/dL   HCT 37.9 (*) 39.0 - 52.0 %   MCV 95.7  78.0 - 100.0 fL   MCH 33.1  26.0 - 34.0 pg   MCHC 34.6  30.0 - 36.0 g/dL   RDW 14.8  11.5 - 15.5 %   Platelets 325  150 - 400 K/uL  BASIC METABOLIC PANEL     Status: Abnormal   Collection Time    08/14/13  3:55 AM      Result Value Range   Sodium 130 (*) 135 - 145 mEq/L   Potassium 4.0  3.5 - 5.1 mEq/L   Chloride 91 (*) 96 - 112 mEq/L   CO2 26  19 - 32  mEq/L   Glucose, Bld 162 (*) 70 - 99 mg/dL   BUN 32 (*) 6 - 23 mg/dL   Creatinine, Ser 1.94 (*) 0.50 - 1.35 mg/dL   Calcium 9.8  8.4 - 10.5 mg/dL   GFR calc non Af Amer 36 (*) >90 mL/min   GFR calc Af Amer 42 (*) >90 mL/min   Comment: (NOTE)     The eGFR has been calculated using the CKD EPI equation.     This calculation has not been validated in all clinical situations.     eGFR's persistently <90 mL/min signify possible Chronic Kidney     Disease.  GLUCOSE, CAPILLARY     Status: Abnormal   Collection Time    08/14/13  8:08 AM      Result Value Range   Glucose-Capillary 183 (*) 70 - 99 mg/dL   Comment 1  Notify RN     Comment 2 Documented in Chart    GLUCOSE, CAPILLARY     Status: Abnormal   Collection Time    08/14/13 12:22 PM      Result Value Range   Glucose-Capillary 230 (*) 70 - 99 mg/dL   Comment 1 Notify RN     Comment 2 Documented in Chart    SURGICAL PCR SCREEN     Status: None   Collection Time    08/14/13  4:35 PM      Result Value Range   MRSA, PCR NEGATIVE  NEGATIVE   Staphylococcus aureus NEGATIVE  NEGATIVE   Comment:            The Xpert SA Assay (FDA     approved for NASAL specimens     in patients over 62 years of age),     is one component of     a comprehensive surveillance     program.  Test performance has     been validated by [employer] for patients greater     than or equal to 56 year old.     It is not intended     to diagnose infection nor to     guide or monitor treatment.  GLUCOSE, CAPILLARY     Status: Abnormal   Collection Time    08/14/13  5:03 PM      Result Value Range   Glucose-Capillary 162 (*) 70 - 99 mg/dL  GLUCOSE, CAPILLARY     Status: Abnormal   Collection Time    08/14/13  8:10 PM      Result Value Range   Glucose-Capillary 137 (*) 70 - 99 mg/dL  GLUCOSE, CAPILLARY     Status: Abnormal   Collection Time    08/14/13 10:06 PM      Result Value Range   Glucose-Capillary 148 (*) 70 - 99 mg/dL  CBC     Status:  Abnormal   Collection Time    08/15/13 12:49 AM      Result Value Range   WBC 18.9 (*) 4.0 - 10.5 K/uL   RBC 3.62 (*) 4.22 - 5.81 MIL/uL   Hemoglobin 11.7 (*) 13.0 - 17.0 g/dL   HCT 34.3 (*) 39.0 - 52.0 %   MCV 94.8  78.0 - 100.0 fL   MCH 32.3  26.0 - 34.0 pg   MCHC 34.1  30.0 - 36.0 g/dL   RDW 14.7  11.5 - 15.5 %   Platelets 324  150 - 400 K/uL  COMPREHENSIVE METABOLIC PANEL     Status: Abnormal   Collection Time    08/15/13 12:49 AM      Result Value Range   Sodium 130 (*) 135 - 145 mEq/L   Potassium 3.5  3.5 - 5.1 mEq/L   Chloride 93 (*) 96 - 112 mEq/L   CO2 25  19 - 32 mEq/L   Glucose, Bld 171 (*) 70 - 99 mg/dL   BUN 36 (*) 6 - 23 mg/dL   Creatinine, Ser 1.93 (*) 0.50 - 1.35 mg/dL   Calcium 8.7  8.4 - 10.5 mg/dL   Total Protein 6.3  6.0 - 8.3 g/dL   Albumin 1.9 (*) 3.5 - 5.2 g/dL   AST 53 (*) 0 - 37 U/L   ALT 27  0 - 53 U/L   Alkaline Phosphatase 40  39 - 117 U/L   Total Bilirubin 1.1  0.3 - 1.2 mg/dL  GFR calc non Af Amer 36 (*) >90 mL/min   GFR calc Af Amer 42 (*) >90 mL/min   Comment: (NOTE)     The eGFR has been calculated using the CKD EPI equation.     This calculation has not been validated in all clinical situations.     eGFR's persistently <90 mL/min signify possible Chronic Kidney     Disease.  LACTIC ACID, PLASMA     Status: None   Collection Time    08/15/13 12:49 AM      Result Value Range   Lactic Acid, Venous 1.4  0.5 - 2.2 mmol/L  TROPONIN I     Status: None   Collection Time    08/15/13 12:49 AM      Result Value Range   Troponin I <0.30  <0.30 ng/mL   Comment:            Due to the release kinetics of cTnI,     a negative result within the first hours     of the onset of symptoms does not rule out     myocardial infarction with certainty.     If myocardial infarction is still suspected,     repeat the test at appropriate intervals.  PROTIME-INR     Status: Abnormal   Collection Time    08/15/13 12:49 AM      Result Value Range    Prothrombin Time 17.5 (*) 11.6 - 15.2 seconds   INR 1.48  0.00 - 1.49  APTT     Status: Abnormal   Collection Time    08/15/13 12:49 AM      Result Value Range   aPTT 43 (*) 24 - 37 seconds   Comment:            IF BASELINE aPTT IS ELEVATED,     SUGGEST PATIENT RISK ASSESSMENT     BE USED TO DETERMINE APPROPRIATE     ANTICOAGULANT THERAPY.  FIBRINOGEN     Status: Abnormal   Collection Time    08/15/13 12:49 AM      Result Value Range   Fibrinogen >800 (*) 204 - 475 mg/dL  TYPE AND SCREEN     Status: None   Collection Time    08/15/13 12:50 AM      Result Value Range   ABO/RH(D) O POS     Antibody Screen NEG     Sample Expiration 08/18/2013    URINALYSIS, ROUTINE W REFLEX MICROSCOPIC     Status: Abnormal   Collection Time    08/15/13  2:37 AM      Result Value Range   Color, Urine AMBER (*) YELLOW   Comment: BIOCHEMICALS MAY BE AFFECTED BY COLOR   APPearance CLOUDY (*) CLEAR   Specific Gravity, Urine 1.025  1.005 - 1.030   pH 5.5  5.0 - 8.0   Glucose, UA NEGATIVE  NEGATIVE mg/dL   Hgb urine dipstick NEGATIVE  NEGATIVE   Bilirubin Urine SMALL (*) NEGATIVE   Ketones, ur NEGATIVE  NEGATIVE mg/dL   Protein, ur 30 (*) NEGATIVE mg/dL   Urobilinogen, UA 1.0  0.0 - 1.0 mg/dL   Nitrite NEGATIVE  NEGATIVE   Leukocytes, UA NEGATIVE  NEGATIVE  URINE MICROSCOPIC-ADD ON     Status: Abnormal   Collection Time    08/15/13  2:37 AM      Result Value Range   WBC, UA 0-2  <3 WBC/hpf   RBC / HPF 0-2  <3 RBC/hpf  Bacteria, UA FEW (*) RARE   Casts HYALINE CASTS (*) NEGATIVE  GLUCOSE, CAPILLARY     Status: Abnormal   Collection Time    08/15/13  3:45 AM      Result Value Range   Glucose-Capillary 171 (*) 70 - 99 mg/dL  CBC     Status: Abnormal   Collection Time    08/15/13  3:46 AM      Result Value Range   WBC 17.9 (*) 4.0 - 10.5 K/uL   RBC 3.63 (*) 4.22 - 5.81 MIL/uL   Hemoglobin 11.6 (*) 13.0 - 17.0 g/dL   HCT 34.2 (*) 39.0 - 52.0 %   MCV 94.2  78.0 - 100.0 fL   MCH 32.0   26.0 - 34.0 pg   MCHC 33.9  30.0 - 36.0 g/dL   RDW 14.6  11.5 - 15.5 %   Platelets 308  150 - 400 K/uL  BASIC METABOLIC PANEL     Status: Abnormal   Collection Time    08/15/13  3:46 AM      Result Value Range   Sodium 130 (*) 135 - 145 mEq/L   Potassium 3.7  3.5 - 5.1 mEq/L   Chloride 94 (*) 96 - 112 mEq/L   CO2 25  19 - 32 mEq/L   Glucose, Bld 207 (*) 70 - 99 mg/dL   BUN 36 (*) 6 - 23 mg/dL   Creatinine, Ser 1.98 (*) 0.50 - 1.35 mg/dL   Calcium 8.5  8.4 - 10.5 mg/dL   GFR calc non Af Amer 35 (*) >90 mL/min   GFR calc Af Amer 41 (*) >90 mL/min   Comment: (NOTE)     The eGFR has been calculated using the CKD EPI equation.     This calculation has not been validated in all clinical situations.     eGFR's persistently <90 mL/min signify possible Chronic Kidney     Disease.  GLUCOSE, CAPILLARY     Status: Abnormal   Collection Time    08/15/13  8:04 AM      Result Value Range   Glucose-Capillary 162 (*) 70 - 99 mg/dL   Comment 1 Documented in Chart     Comment 2 Notify RN      Imaging / Studies: Ct Abdomen Pelvis Wo Contrast  08/14/2013   CLINICAL DATA:  Fever, leukocytosis, prior hernia repairs and appendectomy  EXAM: CT ABDOMEN AND PELVIS WITHOUT CONTRAST  TECHNIQUE: Multidetector CT imaging of the abdomen and pelvis was performed following the standard protocol without intravenous contrast.  COMPARISON:  None.  FINDINGS: Note: Given body habitus, the entire anterior abdominal wall (including a portion of the abnormality) could not be included in the field of view despite two separate attempts.  Prior ventral hernia mesh repair. Just anterior to the mesh, within the anterior abdominal wall, is an abdominal wall fluid collection/abscess measuring approximately 14.5 x 18.1 x 26.1 cm (incompletely visualized). Along the superior aspect of the fluid collection, gas extends to immediately beneath the skin surface (series 10/image 27). Bowel loops are displaced posteriorly by the large  collection fluid without definite communication.  A small hernia containing fat and a loop of nondilated colon is present inferiorly (sagittal image 88), to the left of midline (series 10/image 81 and 86).  Minimal left basilar atelectasis.  Cardiomegaly. Coronary atherosclerosis. ICD leads, incompletely visualized.  Moderate hepatic steatosis.  Unenhanced spleen and pancreas are unremarkable.  Low-density nodular thickening of the bilateral adrenal glands, likely reflecting adrenal  adenomas.  Gallbladder is notable for layering gallstone. No intrahepatic or extrahepatic ductal dilatation.  Kidneys are unremarkable. No renal calculi or hydronephrosis.  No evidence of bowel obstruction. Prior appendectomy.  Atherosclerotic calcifications of the abdominal aorta and branch vessels.  No abdominopelvic ascites.  No suspicious abdominopelvic lymphadenopathy.  Prostate is unremarkable.  Bladder is within normal limits.  Degenerative changes of the visualized thoracolumbar spine.  IMPRESSION: 14.5 x 18.1 x 26.1 cm fluid collection/abscess within the anterior abdominal wall, as described above, incompletely visualized. The collection appears just anterior to prior ventral hernia mesh.  Given the size and proximity to the skin surface, incision and drainage is suggested.  These results were called by telephone at the time of interpretation on 08/14/2013 at 3:22 PM to Dr. Irine Seal , who verbally acknowledged these results.   Electronically Signed   By: Julian Hy M.D.   On: 08/14/2013 15:28   Dg Chest Portable 1 View  08/15/2013   CLINICAL DATA:  Sepsis, respiratory distress  EXAM: PORTABLE CHEST - 1 VIEW  COMPARISON:  Prior radiograph from 08/13/2013  FINDINGS: Left-sided pacemaker is unchanged. Cardiomegaly is stable as compared to the prior exam.  Lungs are normally inflated. There has been worsening of pulmonary vascular congestion and edema as compared to the prior examination. No definite pleural  effusion. No focal infiltrates appreciated. No pneumothorax.  Osseous structures are unchanged.  IMPRESSION: Stable cardiomegaly with interval worsening of pulmonary vascular congestion/ edema relative to 08/13/2013.   Electronically Signed   By: Jeannine Boga M.D.   On: 08/15/2013 01:16   Dg Chest Port 1 View  08/13/2013   CLINICAL DATA:  Leukocytosis.  EXAM: PORTABLE CHEST - 1 VIEW  COMPARISON:  08/01/2013  FINDINGS: No focal pulmonary consolidation is identified. There is stable mild pulmonary venous prominence without overt edema or visualized pleural fluid. The heart remains mildly enlarged. There is stable appearance of a biventricular pacemaker.  IMPRESSION: No acute findings. Stable cardiomegaly and pulmonary venous prominence without overt edema.   Electronically Signed   By: Aletta Edouard M.D.   On: 08/13/2013 15:44    Medications / Allergies: per chart  Antibiotics: Anti-infectives   Start     Dose/Rate Route Frequency Ordered Stop   08/14/13 1600  piperacillin-tazobactam (ZOSYN) IVPB 3.375 g     3.375 g 12.5 mL/hr over 240 Minutes Intravenous Every 8 hours 08/14/13 1543

## 2013-08-15 NOTE — Progress Notes (Signed)
Subjective:  Heartrate 70's. Feels better post surgery. Needed fluids post surgery. T max 101.3  Objective:  Vital Signs in the last 24 hours: Temp:  [97.9 F (36.6 C)-101.3 F (38.5 C)] 99.7 F (37.6 C) (11/09 1024) Pulse Rate:  [59-88] 73 (11/09 1024) Cardiac Rhythm:  [-] A-V Sequential paced;Atrial flutter;Normal sinus rhythm (11/09 0800) Resp:  [0-30] 21 (11/09 1024) BP: (73-144)/(44-79) 106/45 mmHg (11/09 1024) SpO2:  [91 %-100 %] 95 % (11/09 1024)  Physical Exam: BP Readings from Last 1 Encounters:  08/15/13 106/45     Wt Readings from Last 1 Encounters:  08/14/13 156.5 kg (345 lb 0.3 oz)    Weight change:   HEENT: [place]/AT, Eyes-Brown, Conjunctiva-Pink, Sclera-Non-icteric Neck: No JVD, No bruit, Trachea midline. Lungs:  Clear, Bilateral. Cardiac:  Irregular rhythm, normal S1 and S2, no S3.  Abdomen:  Soft, Epigastric and umbilical area dressing, mildly-tender. Extremities:  No edema present. No cyanosis. No clubbing. CNS: AxOx3, Cranial nerves grossly intact, moves all 4 extremities. Right handed. Skin: Warm and dry.   Intake/Output from previous day: 11/08 0701 - 11/09 0700 In: 6220 [P.O.:1010; I.V.:2160; IV Piggyback:3050] Out: 3955 [Urine:845; Blood:110]    Lab Results: BMET    Component Value Date/Time   NA 130* 08/15/2013 0346   K 3.7 08/15/2013 0346   CL 94* 08/15/2013 0346   CO2 25 08/15/2013 0346   GLUCOSE 207* 08/15/2013 0346   BUN 36* 08/15/2013 0346   CREATININE 1.98* 08/15/2013 0346   CALCIUM 8.5 08/15/2013 0346   GFRNONAA 35* 08/15/2013 0346   GFRAA 41* 08/15/2013 0346   CBC    Component Value Date/Time   WBC 17.9* 08/15/2013 0346   RBC 3.63* 08/15/2013 0346   HGB 11.6* 08/15/2013 0346   HCT 34.2* 08/15/2013 0346   PLT 308 08/15/2013 0346   MCV 94.2 08/15/2013 0346   MCH 32.0 08/15/2013 0346   MCHC 33.9 08/15/2013 0346   RDW 14.6 08/15/2013 0346   LYMPHSABS 1.3 08/13/2013 0512   MONOABS 2.1* 08/13/2013 0512   EOSABS 0.2 08/13/2013 0512   BASOSABS 0.0  08/13/2013 0512   CARDIAC ENZYMES Lab Results  Component Value Date   CKTOTAL 62 01/02/2010   CKMB 2.8 06/11/2007   TROPONINI <0.30 08/15/2013    Scheduled Meds: . acetaminophen  1,000 mg Oral TID  . atorvastatin  40 mg Oral QODAY  . carvedilol  6.25 mg Oral BID WC  . insulin aspart  0-20 Units Subcutaneous Q4H  . insulin glargine  60 Units Subcutaneous QHS  . lip balm  1 application Topical BID  . piperacillin-tazobactam (ZOSYN)  IV  3.375 g Intravenous Q8H  . psyllium  1 packet Oral Daily  . saccharomyces boulardii  250 mg Oral BID  . sodium chloride  3 mL Intravenous Q12H  . vitamin A & D       Continuous Infusions: . sodium chloride 100 mL/hr at 08/15/13 0230  . norepinephrine (LEVOPHED) Adult infusion     PRN Meds:.sodium chloride, alum & mag hydroxide-simeth, diphenhydrAMINE, lactated ringers, magic mouthwash, metoprolol, morphine injection, ondansetron (ZOFRAN) IV, ondansetron, oxyCODONE, sodium chloride, sodium chloride, sorbitol  Assessment/Plan: Chronic atrial flutter with controlled ventricular response  Mild coronary artery disease  Mild nonischemic dilated cardiomyopathy  S/P ICD  Compensated systolic heart failure  Hypertension  Insulin requiring diabetes mellitus  Morbid obesity  Hypercholesteremia  Obstructive sleep apnea/obesity hypoventilation syndrome  Chronic kidney disease  Marked leukocytosis etiology unclear  Possible infected ventral umbilical hernia skin  Status post fall  Will postpone TEE/Cardioversion till stable. Continue medical treatment.   LOS: 4 days    Dixie Dials  MD  08/15/2013, 11:20 AM

## 2013-08-15 NOTE — Consult Note (Signed)
[facility]    Date of Admission:  08/11/2013           Day 2 piperacillin tazobactam       Reason for Consult: Large abdominal wall abscess    Referring Physician: Dr. Irine Seal  Principal Problem:   Infected prosthetic mesh of abdominal wall with GIANT abscess s/p removal 08/14/2013 Active Problems:   Sepsis   Recurrent ventral incisional hernia   Acute renal failure (ARF)   Diabetes mellitus, insulin dependent (IDDM), uncontrolled   HYPERLIPIDEMIA   Obesity, Class III, BMI 40-49.9 (morbid obesity)   OBSTRUCTIVE SLEEP APNEA   Hypertension   Acute-on-chronic respiratory failure secondary to probable community-acquired pneumonia   Fever   Constipation, chronic   Chronic combined systolic and diastolic CHF (congestive heart failure)   Right leg pain   Leukocytosis   AAA (abdominal aortic aneurysm)/ 4.5 cm ascending per CT angio 08/01/13   . acetaminophen  1,000 mg Oral TID  . atorvastatin  40 mg Oral QODAY  . carvedilol  6.25 mg Oral BID WC  . insulin aspart  0-20 Units Subcutaneous Q4H  . insulin glargine  60 Units Subcutaneous QHS  . lip balm  1 application Topical BID  . piperacillin-tazobactam (ZOSYN)  IV  3.375 g Intravenous Q8H  . psyllium  1 packet Oral Daily  . saccharomyces boulardii  250 mg Oral BID  . sodium chloride  3 mL Intravenous Q12H    Recommendations: 1. Start vancomycin 2. Continue piperacillin tazobactam 3. Await culture results   Assessment: He has a large abdominal wall abscess with gram-positive cocci in pairs and clusters on Gram stain. I will add vancomycin to cover for possible MRSA pending final culture results.    HPI: Kurt Cross is a 62 y.o. male with a remote history of bowel perforation requiring exploratory laparotomy and temporary colostomy. He then developed a ventral hernia and underwent mesh repair decades ago. He was recently hospitalized and treated for possible pneumonia. He had a  fall at home recently and injured his right lower leg leading to readmission. He was noted to be febrile with some abdominal wall swelling. CT scan revealed a large abdominal wall abscess. Yesterday he had drainage of 3 L of pus and removal of his old Gore-Tex mesh.   Review of Systems: Pertinent items are noted in HPI.  Past Medical History  Diagnosis Date  . Sleep apnea   . Diabetes mellitus   . Hypertension   . Hyperlipidemia   . CHF (congestive heart failure)   . Hernia   . AAA (abdominal aortic aneurysm)/ 4.5 cm ascending per CT angio 08/01/13 08/12/2013  . Infected prosthetic mesh of abdominal wall with GIANT abscess s/p removal 08/14/2013 08/14/2013    History  Substance Use Topics  . Smoking status: Former Smoker -- 1.00 packs/day for 40 years    Quit date: 08/21/2012  . Smokeless tobacco: Never Used  . Alcohol Use: No    History reviewed. No pertinent family history. No Known Allergies  OBJECTIVE: Blood pressure 126/65, pulse 67, temperature 99.7 F (37.6 C), temperature source Core (Comment), resp. rate 25, height 6' (1.829 m), weight 156.5 kg (345 lb 0.3 oz), SpO2 95.00%. General: Alert and comfortable talking on the telephone Skin:  No rash, splitter or conjunctival hemorrhages Lungs: Clear Cor: Regular S1 and S2 with no murmurs Abdomen: Obese and distended. Goals dressing in place    Lab Results  Component Value Date   WBC 17.9* 08/15/2013   HGB 11.6* 08/15/2013   HCT 34.2* 08/15/2013   MCV 94.2 08/15/2013   PLT 308 08/15/2013   Lab Results  Component Value Date   CREATININE 1.98* 08/15/2013   BUN 36* 08/15/2013   NA 130* 08/15/2013   K 3.7 08/15/2013   CL 94* 08/15/2013   CO2 25 08/15/2013   Lab Results  Component Value Date   ALT 27 08/15/2013   AST 53* 08/15/2013   ALKPHOS 40 08/15/2013   BILITOT 1.1 08/15/2013   Microbiology: Recent Results (from the past 240 hour(s))  URINE CULTURE     Status: None   Collection Time    08/11/13  6:24 PM      Result  Value Range Status   Specimen Description URINE, CLEAN CATCH   Final   Special Requests NONE   Final   Culture  Setup Time     Final   Value: 08/12/2013 02:25     Performed at [facility] Count     Final   Value: NO GROWTH     Performed at [facility]   Culture     Final   Value: NO GROWTH     Performed at [facility]   Report Status 08/13/2013 FINAL   Final  CULTURE, BLOOD (ROUTINE X 2)     Status: None   Collection Time    08/13/13  2:55 PM      Result Value Range Status   Specimen Description BLOOD LEFT HAND   Final   Special Requests BOTTLES DRAWN AEROBIC ONLY 10CC   Final   Culture  Setup Time     Final   Value: 08/13/2013 22:30     Performed at [facility]   Culture     Final   Value:        BLOOD CULTURE RECEIVED NO GROWTH TO DATE CULTURE WILL BE HELD FOR 5 DAYS BEFORE ISSUING A FINAL NEGATIVE REPORT     Performed at [facility]   Report Status PENDING   Incomplete  CULTURE, BLOOD (ROUTINE X 2)     Status: None   Collection Time    08/13/13  3:00 PM      Result Value Range Status   Specimen Description BLOOD LEFT ARM   Final   Special Requests BOTTLES DRAWN AEROBIC AND ANAEROBIC 10CC   Final   Culture  Setup Time     Final   Value: 08/13/2013 22:31     Performed at [facility]   Culture     Final   Value:        BLOOD CULTURE RECEIVED NO GROWTH TO DATE CULTURE WILL BE HELD FOR 5 DAYS BEFORE ISSUING A FINAL NEGATIVE REPORT     Performed at [facility]   Report Status PENDING   Incomplete  SURGICAL PCR SCREEN     Status: None   Collection Time    08/14/13  4:35 PM      Result Value Range Status   MRSA, PCR NEGATIVE  NEGATIVE Final   Staphylococcus aureus NEGATIVE  NEGATIVE Final   Comment:            The Xpert SA Assay (FDA     approved for NASAL specimens     in patients over 53 years of age),     is one component of     a comprehensive surveillance  program.  Test performance has      been validated by [facility] for patients greater     than or equal to 73 year old.     It is not intended     to diagnose infection nor to     guide or monitor treatment.  CULTURE, ROUTINE-ABSCESS     Status: None   Collection Time    08/14/13  6:28 PM      Result Value Range Status   Specimen Description ABDOMEN INCISION DRAINAGE   Final   Special Requests NONE   Final   Gram Stain     Final   Value: ABUNDANT WBC PRESENT, PREDOMINANTLY PMN     NO SQUAMOUS EPITHELIAL CELLS SEEN     MODERATE GRAM POSITIVE COCCI IN PAIRS     IN CLUSTERS     Performed at [facility]   Culture PENDING   Incomplete   Report Status PENDING   Incomplete  CLOSTRIDIUM DIFFICILE BY PCR     Status: None   Collection Time    08/15/13  9:04 AM      Result Value Range Status   C difficile by pcr NEGATIVE  NEGATIVE Final   Comment: Performed at [place], [place] [facility] [phone] pager   [phone] cell 08/15/2013, 1:44 PM

## 2013-08-15 NOTE — Progress Notes (Signed)
ANTIBIOTIC CONSULT NOTE   Pharmacy Consult for Zosyn/vancomycin Indication: intra-abdominal coverage  No Known Allergies  Patient Measurements: Height: 6' (182.9 cm) Weight: 345 lb 0.3 oz (156.5 kg) IBW/kg (Calculated) : 77.6  Vital Signs: Temp: 99.7 F (37.6 C) (11/09 1300) Temp src: Core (Comment) (11/09 1300) BP: 126/65 mmHg (11/09 1300) Pulse Rate: 67 (11/09 1300)  Labs:  Recent Labs  08/14/13 0355 08/15/13 0049 08/15/13 0346  WBC 19.6* 18.9* 17.9*  HGB 13.1 11.7* 11.6*  PLT 325 324 308  CREATININE 1.94* 1.93* 1.98*    Medical History: Past Medical History  Diagnosis Date  . Sleep apnea   . Diabetes mellitus   . Hypertension   . Hyperlipidemia   . CHF (congestive heart failure)   . Hernia   . AAA (abdominal aortic aneurysm)/ 4.5 cm ascending per CT angio 08/01/13 08/12/2013  . Infected prosthetic mesh of abdominal wall with GIANT abscess s/p removal 08/14/2013 08/14/2013     Assessment: 47 yoM presented 11/5 with c/o R leg pain and inability to ambulate s/p fall. Pt was recently hospitalized for CAP, discharged 10/30 and reportedly completed his course of abx with Levaquin 1 day PTA. Leukocytosis noted on admission, no clear source of infection so abx not started. On 11/7, pt with fever of 102.4 and abdominal distention and erythema. CT abd showed 14.5 x 18.1 x 26.1 cm fluid collection/abscess within the anterior abd wall, to OR by Dr. Johney Maine 11/9 revealed infected prosthetic Gore-tex mesh with gian abd abscess s/p mesh removal and I&D.  Apparently, 3L pus drain intra-op. ID adding vancomycin  11/8 >> Zosyn >>  11/9 >> vancomcyin >>  Tmax: 101 WBCs: 17.9 Renal: Scr 1.98, CG 78ml/min, N 76ml/min  11/7 urine >> NG 11/7 blood >> NGTD 11/8: intra-op wound cx: pending 11/9: c. Diff: pending 11/9: urine cx: pending 11/9: blood x 2: pending 11/9: sputum: ordered  Goal vancomycin trough: 10-43mcg/ml  Plan:   Vancomycin 2gm IV q24h (give 2nd dose <24h to  complete staggered loading dose)  Continue Zosyn 3.375 gm IV q8h (extended infusion over 4 hours)  Doreene Eland, PharmD, BCPS.   Pager: [phone] 08/15/2013 2:06 PM

## 2013-08-15 NOTE — Progress Notes (Signed)
BP not trending up at 2343 after 3/4 of 1L bolus given, called Overton Mam, she stated would be up to see.

## 2013-08-15 NOTE — Consult Note (Signed)
PULMONARY  / CRITICAL CARE MEDICINE  Name: Kurt Cross MRN: [id number] DOB: December 21, 1950    ADMISSION DATE:  08/11/2013 CONSULTATION DATE:  08/14/2013  REFERRING MD :  Dr. Grandville Silos PRIMARY SERVICE: [facility]  CHIEF COMPLAINT:  Hypotension   BRIEF PATIENT DESCRIPTION: 67 M with CHF, DM and large abdominal wall abscess with fluid-responsive hypotension. PCCM consulted for evaluation and possible CVC placement.  SIGNIFICANT EVENTS / STUDIES:  1. Abdominal Wall Debridement 11/8  LINES / TUBES: 1. PIV  CULTURES: 1. Wound Culture Pending  ANTIBIOTICS: 1. Zosyn 11/8  HISTORY OF PRESENT ILLNESS:  Kurt Cross is a 84 M with SCHF (EF 50-55%), DM, recent admission for CAP, and a large abdominal wall abscess s/p drainage 11/8. Afterwards, Kurt Cross developed hypotension which subsequently responded to fluid boluses. Currently, his only complaint is abdominal pain. He denies lightheadedness, SOB, and CP. He is tolerating CPAP.  PAST MEDICAL HISTORY :  Past Medical History  Diagnosis Date  . Sleep apnea   . Diabetes mellitus   . Hypertension   . Hyperlipidemia   . CHF (congestive heart failure)   . Hernia   . AAA (abdominal aortic aneurysm)/ 4.5 cm ascending per CT angio 08/01/13 08/12/2013    Past Surgical History  Procedure Laterality Date  . Appendectomy    . Tonsillectomy    . Nasal septum surgery    . Bowel resection  1980s?    ?colectomy/ostomy - Dr Elesa Hacker  . Ostomy take down  1980s?    Dr Elesa Hacker  . Incisional hernia repair  1980s?    Dr. Lindon Romp - w mesh  . Knee surgery Right     Prior to Admission medications   Medication Sig Start Date End Date Taking? Authorizing Provider  carvedilol (COREG) 6.25 MG tablet Take 1 tablet (6.25 mg total) by mouth 2 (two) times daily with a meal. 08/27/12  Yes Ripudeep K Rai, MD  furosemide (LASIX) 80 MG tablet Take 1 tablet (80 mg total) by mouth 2 (two) times daily. 08/27/12  Yes Ripudeep Krystal Eaton, MD  insulin aspart (NOVOLOG) 100 UNIT/ML  injection Inject 12 Units into the skin 2 (two) times daily.   Yes Historical Provider, MD  insulin NPH (HUMULIN N,NOVOLIN N) 100 UNIT/ML injection Inject 40 Units into the skin 2 (two) times daily at 8 am and 10 pm.   Yes Historical Provider, MD  levofloxacin (LEVAQUIN) 750 MG tablet Take 750 mg by mouth daily. 08/05/13  Yes Robbie Lis, MD  lisinopril (PRINIVIL,ZESTRIL) 10 MG tablet Take 1 tablet (10 mg total) by mouth 2 (two) times daily. 08/27/12  Yes Ripudeep Krystal Eaton, MD  oxyCODONE-acetaminophen (PERCOCET/ROXICET) 5-325 MG per tablet Take 1-2 tablets by mouth every 4 (four) hours as needed for moderate pain or severe pain. For  pain 08/05/13  Yes Robbie Lis, MD  rosuvastatin (CRESTOR) 20 MG tablet Take 20 mg by mouth every other day. 08/05/13  Yes Robbie Lis, MD    No Known Allergies  FAMILY HISTORY:  History reviewed. No pertinent family history.  SOCIAL HISTORY:  reports that he quit smoking about a year ago. He has never used smokeless tobacco. He reports that he does not drink alcohol or use illicit drugs.  REVIEW OF SYSTEMS:  A 12-system ROS was conducted and, unless otherwise specified in the HPI, was negative.    PHYSICAL EXAM  VITAL SIGNS: Temp:  [98.3 F (36.8 C)-101.3 F (38.5 C)] 99.1 F (37.3 C) (11/09 0500) Pulse Rate:  [  59-92] 62 (11/09 0500) Resp:  [0-30] 24 (11/09 0500) BP: (73-144)/(44-66) 103/63 mmHg (11/09 0500) SpO2:  [91 %-100 %] 99 % (11/09 0500)  HEMODYNAMICS:    VENTILATOR SETTINGS:    INTAKE / OUTPUT: Intake/Output     11/08 0701 - 11/09 0700   P.O. 1010   I.V. (mL/kg) 1960 (12.5)   IV Piggyback 3050   Total Intake(mL/kg) 6020 (38.5)   Urine (mL/kg/hr) 765 (0.2)   Other 3000 (0.8)   Blood 110 (0)   Total Output 3875   Net +2145       Stool Occurrence 2 x     PHYSICAL EXAMINATION: General:  Obese M in NAD Neuro:  Intact HEENT:  Sclera anicteric, conjunctiva pink. MMM, OP Clear. CPAP mask in place. Neck:  Trachea supple and  midline. (-) LAN or JVD Cardiovascular:  RRR, NS1/S2, (-) MRG Lungs:  CTAB Abdomen:  Obese. Abdominal Binder present; binder C/D/I Musculoskeletal:  (-) C/C/E Skin:  (-) Rash  LABS:  CBC Recent Labs     08/14/13  0355  08/15/13  0049  08/15/13  0346  WBC  19.6*  18.9*  17.9*  HGB  13.1  11.7*  11.6*  HCT  37.9*  34.3*  34.2*  PLT  325  324  308    Coag's Recent Labs     08/13/13  2009  08/15/13  0049  APTT   --   43*  INR  1.31  1.48    BMET Recent Labs     08/14/13  0355  08/15/13  0049  08/15/13  0346  NA  130*  130*  130*  K  4.0  3.5  3.7  CL  91*  93*  94*  CO2  26  25  25   BUN  32*  36*  36*  CREATININE  1.94*  1.93*  1.98*  GLUCOSE  162*  171*  207*    Electrolytes Recent Labs     08/14/13  0355  08/15/13  0049  08/15/13  0346  CALCIUM  9.8  8.7  8.5    Sepsis Markers No results found for this basename: LACTICACIDVEN, PROCALCITON, O2SATVEN,  in the last 72 hours  ABG No results found for this basename: PHART, PCO2ART, PO2ART,  in the last 72 hours  Liver Enzymes Recent Labs     08/13/13  2007  08/15/13  0049  AST  80*  53*  ALT  37  27  ALKPHOS  53  40  BILITOT  0.9  1.1  ALBUMIN  2.2*  1.9*    Cardiac Enzymes Recent Labs     08/12/13  0655  08/15/13  0049  TROPONINI  <0.30  <0.30    Glucose Recent Labs     08/14/13  0808  08/14/13  1222  08/14/13  1703  08/14/13  2010  08/14/13  2206  08/15/13  0345  GLUCAP  183*  230*  162*  137*  148*  171*    Imaging Ct Abdomen Pelvis Wo Contrast  08/14/2013   CLINICAL DATA:  Fever, leukocytosis, prior hernia repairs and appendectomy  EXAM: CT ABDOMEN AND PELVIS WITHOUT CONTRAST  TECHNIQUE: Multidetector CT imaging of the abdomen and pelvis was performed following the standard protocol without intravenous contrast.  COMPARISON:  None.  FINDINGS: Note: Given body habitus, the entire anterior abdominal wall (including a portion of the abnormality) could not be included in the field  of view despite two separate attempts.  Prior ventral  hernia mesh repair. Just anterior to the mesh, within the anterior abdominal wall, is an abdominal wall fluid collection/abscess measuring approximately 14.5 x 18.1 x 26.1 cm (incompletely visualized). Along the superior aspect of the fluid collection, gas extends to immediately beneath the skin surface (series 10/image 27). Bowel loops are displaced posteriorly by the large collection fluid without definite communication.  A small hernia containing fat and a loop of nondilated colon is present inferiorly (sagittal image 88), to the left of midline (series 10/image 81 and 86).  Minimal left basilar atelectasis.  Cardiomegaly. Coronary atherosclerosis. ICD leads, incompletely visualized.  Moderate hepatic steatosis.  Unenhanced spleen and pancreas are unremarkable.  Low-density nodular thickening of the bilateral adrenal glands, likely reflecting adrenal adenomas.  Gallbladder is notable for layering gallstone. No intrahepatic or extrahepatic ductal dilatation.  Kidneys are unremarkable. No renal calculi or hydronephrosis.  No evidence of bowel obstruction. Prior appendectomy.  Atherosclerotic calcifications of the abdominal aorta and branch vessels.  No abdominopelvic ascites.  No suspicious abdominopelvic lymphadenopathy.  Prostate is unremarkable.  Bladder is within normal limits.  Degenerative changes of the visualized thoracolumbar spine.  IMPRESSION: 14.5 x 18.1 x 26.1 cm fluid collection/abscess within the anterior abdominal wall, as described above, incompletely visualized. The collection appears just anterior to prior ventral hernia mesh.  Given the size and proximity to the skin surface, incision and drainage is suggested.  These results were called by telephone at the time of interpretation on 08/14/2013 at 3:22 PM to Dr. Irine Seal , who verbally acknowledged these results.   Electronically Signed   By: Julian Hy M.D.   On: 08/14/2013 15:28    Dg Chest Portable 1 View  08/15/2013   CLINICAL DATA:  Sepsis, respiratory distress  EXAM: PORTABLE CHEST - 1 VIEW  COMPARISON:  Prior radiograph from 08/13/2013  FINDINGS: Left-sided pacemaker is unchanged. Cardiomegaly is stable as compared to the prior exam.  Lungs are normally inflated. There has been worsening of pulmonary vascular congestion and edema as compared to the prior examination. No definite pleural effusion. No focal infiltrates appreciated. No pneumothorax.  Osseous structures are unchanged.  IMPRESSION: Stable cardiomegaly with interval worsening of pulmonary vascular congestion/ edema relative to 08/13/2013.   Electronically Signed   By: Jeannine Boga M.D.   On: 08/15/2013 01:16   Dg Chest Port 1 View  08/13/2013   CLINICAL DATA:  Leukocytosis.  EXAM: PORTABLE CHEST - 1 VIEW  COMPARISON:  08/01/2013  FINDINGS: No focal pulmonary consolidation is identified. There is stable mild pulmonary venous prominence without overt edema or visualized pleural fluid. The heart remains mildly enlarged. There is stable appearance of a biventricular pacemaker.  IMPRESSION: No acute findings. Stable cardiomegaly and pulmonary venous prominence without overt edema.   Electronically Signed   By: Aletta Edouard M.D.   On: 08/13/2013 15:44    ASSESSMENT / PLAN: Principal Problem:   Infected prosthetic mesh of abdominal wall with GIANT abscess Active Problems:   Diabetes mellitus, insulin dependent (IDDM), uncontrolled   HYPERLIPIDEMIA   Obesity, Class III, BMI 40-49.9 (morbid obesity)   OBSTRUCTIVE SLEEP APNEA   Hypertension   Acute-on-chronic respiratory failure secondary to probable community-acquired pneumonia   Fever   Constipation, chronic   Chronic combined systolic and diastolic CHF (congestive heart failure)   Right leg pain   Leukocytosis   AAA (abdominal aortic aneurysm)/ 4.5 cm ascending per CT angio 08/01/13   Recurrent ventral incisional hernia  1. Severe Sepsis:  Currently, Kurt Cross appears to  have responded appropriately to IVF. Per RN displayed BP likely lower than actual. The only current concern is recent low urine output. Will need to follow closely. Would also avoid standing fluid in this patient and only give fluid in response to hypotension.  Agree with Zosyn; may need to adjust based on wound cultures  Hold additional fluid  No indication for CVC  TODAY'S SUMMARY:   I have personally obtained a history, examined the patient, evaluated laboratory and imaging results, formulated the assessment and plan and placed orders.  Margarette Asal, MD Pulmonary and [place] Pager: [phone]  08/15/2013, 5:47 AM

## 2013-08-15 NOTE — Progress Notes (Signed)
Paged on call MD for pain control; patient has had 16mg  of morphine today since 8am. As day has progressed morphine is not relieving pain.  order given and received; made on call MD aware of rounding MDs reluctance to use NSAIDS.

## 2013-08-15 NOTE — Progress Notes (Signed)
Pt's BP trending down, Started 1L bolus per MD PRN orders.

## 2013-08-15 NOTE — Progress Notes (Signed)
[facility] PROGRESS NOTE  ETHELBERT ONDERKO [id number] DOB: 1951/08/15 DOA: 08/11/2013 PCP: [name], MD  Assessment/Plan: #1 infected prosthetic Gore-Tex mesh with a giant abdominal abscess status post IandD and Gore-Tex mesh removal 08/14/2013 Patient is status post incision and drainage of abscess, Gore-Tex mesh removal, the Bradman of abdominal wall and peritoneum 08/14/2013. Cultures pending. Patient on empiric IV Zosyn. Blood cultures pending. Continue wound care as director by general surgery. May need a wound VAC. Continue abdominal binder. Per general surgery. Will consult with infectious disease for further evaluation and management.  #2 sepsis Likely secondary to problem #1. Wound cultures are pending. Blood cultures are pending. Urinalysis was negative. Chest x-Rosner was negative. On empiric IV Zosyn. ID consultation pending.  #3 hypotension Likely secondary to problem #2. Blood pressure responding to IV fluids. Will cultures are pending. Blood cultures are pending. Urinalysis is negative. Chest x-Pollio was negative. Continue empiric IV Zosyn. Fluid boluses as needed.  #4 atrial fibrillation/atrial flutter Likely secondary to problem #1. Patient is currently rate controlled. EKG showed flutter waves. Cardiac enzymes have been negative x3. 2-D echo with EF of 50-55%. Wall motion abnormalities cannot be excluded. Patient stats 2 score is about 3. Continue Coreg for rate control. Eliquis was discontinued yesterday as patient went for emergent surgery secondary to problem #1. General surgery to advise on anticoagulation may be resumed. Patient was to be scheduled for TEE cardioversion tomorrow however in light of problem #1 this may be postponed. Will defer to cardiology. Cardiology is following and appreciate input and recommendations.  #5 right lower extremity pain Secondary to fall. Pain is more in the posterior thigh/hamstrings and posterior Likely musculoskeletal in nature.  Patient with clinical improvement. Continue warm compresses. PT/OT. Follow  #6 leukocytosis/fever Likely secondary to problem #1. Patient with a temp of 100.4 at midnight. Currently afebrile. White count is slowly trending down. Chest x-Hoeppner is negative. Patient with no rest of her symptoms. Urinalysis is negative. Blood cultures are pending. Wound cultures are pending. Continue empiric IV Zosyn. ID consultation pending.  #7 chronic combined systolic and diastolic CHF Stable. Lasix on hold secondary to hypotension. Continue Coreg.  #8 chronic kidney disease stage III Creatinine is trending up. ACE inhibitor and Lasix have been discontinued. Follow.  #9 hyperlipidemia Continue statin.  #10   4.5 cm ascending aortic aneurysm per CT scan of 08/01/2013 Stable. Asymptomatic. Continue risk factor modification with good blood pressure control. Will need outpatient followup with vascular surgery as outpatient.  #11  hypertension Patient noted to be hypotensive postoperatively. Will hold Lasix.  #12  type 2 diabetes Hemoglobin A1c was 9.5 on 08/02/2013. CBGs have ranged from 148-171. Continue Lantus  60 units daily. Sliding scale insulin.  #13 Prophylaxis SCDs for DVT prophylaxis.   Code Status: Full Family Communication: Updated patient and wife at bedside. Disposition Plan: SNF when medically stable.   Consultants:  Cardiology: Dr. Terrence Dupont 08/13/2013  General surgery: Dr. Johney Maine 08/14/2013  ID pending  Procedures:  2-D echo  08/12/2013  X-Luu of the right hip 08/11/2013  CT abdomen and pelvis pending  Incision and drainage of abscess, Gore-Tex mesh removal, debridement of abdominal wall and peritoneum  Antibiotics:  IV Zosyn 08/14/2013  HPI/Subjective: Patient states leg pain improved. Patient asymptomatic denies any chest pain. No shortness of breath. Patient states he's feeling better. No complaints.  Objective: Filed Vitals:   08/15/13 0900  BP: 106/62  Pulse:  68  Temp: 99.5 F (37.5 C)  Resp: 27  Intake/Output Summary (Last 24 hours) at 08/15/13 0917 Last data filed at 08/15/13 0700  Gross per 24 hour  Intake   5860 ml  Output   3955 ml  Net   1905 ml   Filed Weights   08/12/13 0631 08/13/13 0657 08/14/13 0540  Weight: 156.4 kg (344 lb 12.8 oz) 156 kg (343 lb 14.7 oz) 156.5 kg (345 lb 0.3 oz)    Exam:   General:  NAD  Cardiovascular: RRR  Respiratory: CTAB anterior lung fields  Abdomen: Soft/NT/+BS/ Bandage on abdomen  Musculoskeletal: No c/c/e  Data Reviewed: Basic Metabolic Panel:  Recent Labs Lab 08/12/13 0655 08/13/13 0512 08/14/13 0355 08/15/13 0049 08/15/13 0346  NA 132* 132* 130* 130* 130*  K 4.2 4.2 4.0 3.5 3.7  CL 91* 93* 91* 93* 94*  CO2 27 27 26 25 25   GLUCOSE 272* 166* 162* 171* 207*  BUN 23 27* 32* 36* 36*  CREATININE 1.41* 1.52* 1.94* 1.93* 1.98*  CALCIUM 10.1 10.1 9.8 8.7 8.5   Liver Function Tests:  Recent Labs Lab 08/13/13 2007 08/15/13 0049  AST 80* 53*  ALT 37 27  ALKPHOS 53 40  BILITOT 0.9 1.1  PROT 7.1 6.3  ALBUMIN 2.2* 1.9*   No results found for this basename: LIPASE, AMYLASE,  in the last 168 hours No results found for this basename: AMMONIA,  in the last 168 hours CBC:  Recent Labs Lab 08/12/13 0655 08/13/13 0512 08/14/13 0355 08/15/13 0049 08/15/13 0346  WBC 18.8* 18.4* 19.6* 18.9* 17.9*  NEUTROABS  --  14.8*  --   --   --   HGB 14.0 13.3 13.1 11.7* 11.6*  HCT 41.0 39.1 37.9* 34.3* 34.2*  MCV 96.0 95.6 95.7 94.8 94.2  PLT 394 363 325 324 308   Cardiac Enzymes:  Recent Labs Lab 08/11/13 1954 08/12/13 0050 08/12/13 0655 08/15/13 0049  TROPONINI <0.30 <0.30 <0.30 <0.30   BNP (last 3 results)  Recent Labs  08/24/12 1050 08/27/12 0500 08/01/13 1649  PROBNP 1036.0* 253.4* 481.4*   CBG:  Recent Labs Lab 08/14/13 1703 08/14/13 2010 08/14/13 2206 08/15/13 0345 08/15/13 0804  GLUCAP 162* 137* 148* 171* 162*    Recent Results (from the past 240  hour(s))  URINE CULTURE     Status: None   Collection Time    08/11/13  6:24 PM      Result Value Range Status   Specimen Description URINE, CLEAN CATCH   Final   Special Requests NONE   Final   Culture  Setup Time     Final   Value: 08/12/2013 02:25     Performed at [facility] Count     Final   Value: NO GROWTH     Performed at [facility]   Culture     Final   Value: NO GROWTH     Performed at [facility]   Report Status 08/13/2013 FINAL   Final  CULTURE, BLOOD (ROUTINE X 2)     Status: None   Collection Time    08/13/13  2:55 PM      Result Value Range Status   Specimen Description BLOOD LEFT HAND   Final   Special Requests BOTTLES DRAWN AEROBIC ONLY 10CC   Final   Culture  Setup Time     Final   Value: 08/13/2013 22:30     Performed at [facility]   Culture     Final   Value:  BLOOD CULTURE RECEIVED NO GROWTH TO DATE CULTURE WILL BE HELD FOR 5 DAYS BEFORE ISSUING A FINAL NEGATIVE REPORT     Performed at [facility]   Report Status PENDING   Incomplete  CULTURE, BLOOD (ROUTINE X 2)     Status: None   Collection Time    08/13/13  3:00 PM      Result Value Range Status   Specimen Description BLOOD LEFT ARM   Final   Special Requests BOTTLES DRAWN AEROBIC AND ANAEROBIC 10CC   Final   Culture  Setup Time     Final   Value: 08/13/2013 22:31     Performed at [facility]   Culture     Final   Value:        BLOOD CULTURE RECEIVED NO GROWTH TO DATE CULTURE WILL BE HELD FOR 5 DAYS BEFORE ISSUING A FINAL NEGATIVE REPORT     Performed at [facility]   Report Status PENDING   Incomplete  SURGICAL PCR SCREEN     Status: None   Collection Time    08/14/13  4:35 PM      Result Value Range Status   MRSA, PCR NEGATIVE  NEGATIVE Final   Staphylococcus aureus NEGATIVE  NEGATIVE Final   Comment:            The Xpert SA Assay (FDA     approved for NASAL specimens     in patients over 34 years of age),      is one component of     a comprehensive surveillance     program.  Test performance has     been validated by [employer] for patients greater     than or equal to 66 year old.     It is not intended     to diagnose infection nor to     guide or monitor treatment.     Studies: Ct Abdomen Pelvis Wo Contrast  08/14/2013   CLINICAL DATA:  Fever, leukocytosis, prior hernia repairs and appendectomy  EXAM: CT ABDOMEN AND PELVIS WITHOUT CONTRAST  TECHNIQUE: Multidetector CT imaging of the abdomen and pelvis was performed following the standard protocol without intravenous contrast.  COMPARISON:  None.  FINDINGS: Note: Given body habitus, the entire anterior abdominal wall (including a portion of the abnormality) could not be included in the field of view despite two separate attempts.  Prior ventral hernia mesh repair. Just anterior to the mesh, within the anterior abdominal wall, is an abdominal wall fluid collection/abscess measuring approximately 14.5 x 18.1 x 26.1 cm (incompletely visualized). Along the superior aspect of the fluid collection, gas extends to immediately beneath the skin surface (series 10/image 27). Bowel loops are displaced posteriorly by the large collection fluid without definite communication.  A small hernia containing fat and a loop of nondilated colon is present inferiorly (sagittal image 88), to the left of midline (series 10/image 81 and 86).  Minimal left basilar atelectasis.  Cardiomegaly. Coronary atherosclerosis. ICD leads, incompletely visualized.  Moderate hepatic steatosis.  Unenhanced spleen and pancreas are unremarkable.  Low-density nodular thickening of the bilateral adrenal glands, likely reflecting adrenal adenomas.  Gallbladder is notable for layering gallstone. No intrahepatic or extrahepatic ductal dilatation.  Kidneys are unremarkable. No renal calculi or hydronephrosis.  No evidence of bowel obstruction. Prior appendectomy.  Atherosclerotic calcifications of  the abdominal aorta and branch vessels.  No abdominopelvic ascites.  No suspicious abdominopelvic lymphadenopathy.  Prostate is  unremarkable.  Bladder is within normal limits.  Degenerative changes of the visualized thoracolumbar spine.  IMPRESSION: 14.5 x 18.1 x 26.1 cm fluid collection/abscess within the anterior abdominal wall, as described above, incompletely visualized. The collection appears just anterior to prior ventral hernia mesh.  Given the size and proximity to the skin surface, incision and drainage is suggested.  These results were called by telephone at the time of interpretation on 08/14/2013 at 3:22 PM to Dr. Irine Seal , who verbally acknowledged these results.   Electronically Signed   By: Julian Hy M.D.   On: 08/14/2013 15:28   Dg Chest Portable 1 View  08/15/2013   CLINICAL DATA:  Sepsis, respiratory distress  EXAM: PORTABLE CHEST - 1 VIEW  COMPARISON:  Prior radiograph from 08/13/2013  FINDINGS: Left-sided pacemaker is unchanged. Cardiomegaly is stable as compared to the prior exam.  Lungs are normally inflated. There has been worsening of pulmonary vascular congestion and edema as compared to the prior examination. No definite pleural effusion. No focal infiltrates appreciated. No pneumothorax.  Osseous structures are unchanged.  IMPRESSION: Stable cardiomegaly with interval worsening of pulmonary vascular congestion/ edema relative to 08/13/2013.   Electronically Signed   By: Jeannine Boga M.D.   On: 08/15/2013 01:16   Dg Chest Port 1 View  08/13/2013   CLINICAL DATA:  Leukocytosis.  EXAM: PORTABLE CHEST - 1 VIEW  COMPARISON:  08/01/2013  FINDINGS: No focal pulmonary consolidation is identified. There is stable mild pulmonary venous prominence without overt edema or visualized pleural fluid. The heart remains mildly enlarged. There is stable appearance of a biventricular pacemaker.  IMPRESSION: No acute findings. Stable cardiomegaly and pulmonary venous prominence  without overt edema.   Electronically Signed   By: Aletta Edouard M.D.   On: 08/13/2013 15:44    Scheduled Meds: . acetaminophen  1,000 mg Oral TID  . atorvastatin  40 mg Oral QODAY  . carvedilol  6.25 mg Oral BID WC  . insulin aspart  0-20 Units Subcutaneous Q4H  . insulin glargine  60 Units Subcutaneous QHS  . lip balm  1 application Topical BID  . piperacillin-tazobactam (ZOSYN)  IV  3.375 g Intravenous Q8H  . psyllium  1 packet Oral Daily  . saccharomyces boulardii  250 mg Oral BID  . sodium chloride  3 mL Intravenous Q12H  . vitamin A & D       Continuous Infusions: . sodium chloride 100 mL/hr at 08/15/13 0230  . norepinephrine (LEVOPHED) Adult infusion      Principal Problem:   Infected prosthetic mesh of abdominal wall with GIANT abscess s/p removal 08/14/2013 Active Problems:   Diabetes mellitus, insulin dependent (IDDM), uncontrolled   HYPERLIPIDEMIA   Obesity, Class III, BMI 40-49.9 (morbid obesity)   OBSTRUCTIVE SLEEP APNEA   Hypertension   Acute-on-chronic respiratory failure secondary to probable community-acquired pneumonia   Fever   Sepsis   Constipation, chronic   Chronic combined systolic and diastolic CHF (congestive heart failure)   Right leg pain   Leukocytosis   AAA (abdominal aortic aneurysm)/ 4.5 cm ascending per CT angio 08/01/13   Recurrent ventral incisional hernia   Acute renal failure (ARF)    Time spent: 35 mins    [facility] Pager [phone]. If 7PM-7AM, please contact night-coverage at [email or web address], password [facility] 08/15/2013, 9:17 AM  LOS: 4 days

## 2013-08-15 NOTE — Progress Notes (Signed)
[facility] Progress Note Patient Name: Kurt Cross DOB: [birth year] MRN: [id number]  Date of Service  08/15/2013   HPI/Events of Note   Called by hospitalist service for hypotension.  Patient s/p drainage of large abd wall abscess today.  Post-op fever and hypotension.  Septic shock.  eICU Interventions  Sepsis protocol CVC      Melvia Heaps 08/15/2013, 12:16 AM

## 2013-08-16 ENCOUNTER — Encounter (HOSPITAL_COMMUNITY): Payer: Self-pay | Admitting: Surgery

## 2013-08-16 ENCOUNTER — Encounter (HOSPITAL_COMMUNITY): Payer: Self-pay | Admitting: Anesthesiology

## 2013-08-16 ENCOUNTER — Encounter (HOSPITAL_COMMUNITY): Admission: EM | Disposition: A | Discharge status: Home / Self Care | Payer: Self-pay | Source: Home / Self Care

## 2013-08-16 DIAGNOSIS — A419 Sepsis, unspecified organism: Secondary | ICD-10-CM

## 2013-08-16 LAB — BASIC METABOLIC PANEL WITH GFR
BUN: 40 mg/dL — ABNORMAL HIGH (ref 6–23)
CO2: 26 meq/L (ref 19–32)
Calcium: 8.9 mg/dL (ref 8.4–10.5)
Chloride: 97 meq/L (ref 96–112)
Creatinine, Ser: 2.03 mg/dL — ABNORMAL HIGH (ref 0.50–1.35)
GFR calc Af Amer: 39 mL/min — ABNORMAL LOW (ref >90)
GFR calc non Af Amer: 34 mL/min — ABNORMAL LOW (ref >90)
Glucose, Bld: 214 mg/dL — ABNORMAL HIGH (ref 70–99)
Potassium: 4.1 meq/L (ref 3.5–5.1)
Sodium: 132 meq/L — ABNORMAL LOW (ref 135–145)

## 2013-08-16 LAB — GLUCOSE, CAPILLARY
Glucose-Capillary: 200 mg/dL — ABNORMAL HIGH (ref 70–99)
Glucose-Capillary: 183 mg/dL — ABNORMAL HIGH (ref 70–99)

## 2013-08-16 LAB — CBC WITH DIFFERENTIAL/PLATELET
Eosinophils Absolute: 0.4 10*3/uL (ref 0.0–0.7)
Lymphocytes Relative: 11 % — ABNORMAL LOW (ref 12–46)
Lymphs Abs: 1.7 10*3/uL (ref 0.7–4.0)
Monocytes Relative: 8 % (ref 3–12)
Neutro Abs: 11.9 10*3/uL — ABNORMAL HIGH (ref 1.7–7.7)
Neutrophils Relative %: 78 % — ABNORMAL HIGH (ref 43–77)
Platelets: 312 10*3/uL (ref 150–400)
RBC: 3.47 MIL/uL — ABNORMAL LOW (ref 4.22–5.81)
WBC: 15.3 10*3/uL — ABNORMAL HIGH (ref 4.0–10.5)

## 2013-08-16 LAB — MAGNESIUM: Magnesium: 1.9 mg/dL (ref 1.5–2.5)

## 2013-08-16 SURGERY — ECHOCARDIOGRAM, TRANSESOPHAGEAL
Anesthesia: Moderate Sedation

## 2013-08-16 MED ORDER — SODIUM CHLORIDE 0.9 % IV BOLUS (SEPSIS)
500.0000 mL | Freq: Once | INTRAVENOUS | Status: AC
  Administered 2013-08-16: 500 mL via INTRAVENOUS

## 2013-08-16 MED ORDER — INSULIN GLARGINE 100 UNIT/ML ~~LOC~~ SOLN
65.0000 [IU] | Freq: Every day | SUBCUTANEOUS | Status: DC
  Administered 2013-08-16 – 2013-09-06 (×22): 65 [IU] via SUBCUTANEOUS
  Filled 2013-08-16 (×27): qty 0.65

## 2013-08-16 MED ORDER — MORPHINE SULFATE 2 MG/ML IJ SOLN
2.0000 mg | INTRAMUSCULAR | Status: DC | PRN
  Administered 2013-08-16 (×3): 2 mg via INTRAVENOUS
  Filled 2013-08-16 (×3): qty 1

## 2013-08-16 NOTE — Progress Notes (Signed)
Pt seen and examined. See pic Cont with w-d for now until less undermining.  Discussed importance of keeping dressing moistened when changing  Kurt Cross. Redmond Pulling, MD, FACS General, Bariatric, & Minimally Invasive Surgery [facility], [place]

## 2013-08-16 NOTE — Progress Notes (Signed)
Wound dressing change at 3:30 PM Wound dimensions: 23 x 9 x 3.5cm Undermining: 3 cm at 12:00 o'clock                       7.5 cm at 3 o'clock                        6 cm at 6 o'clock                        5 cm at 9 o'clock     Pack Wet to dry with Kerlix, for now, to much undermining to place a Wound vac.

## 2013-08-16 NOTE — Progress Notes (Signed)
[facility] for Infectious Disease    Date of Admission:  08/11/2013   Total days of antibiotics 3        Day 3 piptazo        Day 2 vanco           ID: Kurt Cross is a 62 y.o. male  with a remote history of bowel perforation requiring exploratory laparotomy and temporary colostomy. He then developed a ventral hernia and underwent mesh repair decades ago. He was recently hospitalized and treated for possible pneumonia. He had a fall at home recently and injured his right lower leg leading to readmission. He was noted to be febrile with some abdominal wall swelling. CT scan revealed a large abdominal wall abscess. POD#2 from drainage of 3 L of pus and removal of his old Gore-Tex mesh.  Principal Problem:   Infected prosthetic mesh of abdominal wall with GIANT abscess s/p removal 08/14/2013 Active Problems:   Diabetes mellitus, insulin dependent (IDDM), uncontrolled   HYPERLIPIDEMIA   Obesity, Class III, BMI 40-49.9 (morbid obesity)   OBSTRUCTIVE SLEEP APNEA   Hypertension   Acute-on-chronic respiratory failure secondary to probable community-acquired pneumonia   Fever   Sepsis   Constipation, chronic   Chronic combined systolic and diastolic CHF (congestive heart failure)   Right leg pain   Leukocytosis   AAA (abdominal aortic aneurysm)/ 4.5 cm ascending per CT angio 08/01/13   Recurrent ventral incisional hernia   Acute renal failure (ARF)    Subjective: Afebrile.  Medications:  . acetaminophen  1,000 mg Oral TID  . atorvastatin  40 mg Oral QODAY  . insulin aspart  0-20 Units Subcutaneous Q4H  . insulin glargine  65 Units Subcutaneous QHS  . lip balm  1 application Topical BID  . piperacillin-tazobactam (ZOSYN)  IV  3.375 g Intravenous Q8H  . psyllium  1 packet Oral Daily  . saccharomyces boulardii  250 mg Oral BID  . sodium chloride  3 mL Intravenous Q12H  . vancomycin  2,000 mg Intravenous Q24H    Objective: Vital signs in last 24 hours: Temp:  [96.8 F (36  C)-99.5 F (37.5 C)] 97.2 F (36.2 C) (11/10 1045) Pulse Rate:  [49-79] 60 (11/10 1115) Resp:  [13-27] 13 (11/10 1115) BP: (45-155)/(18-129) 101/18 mmHg (11/10 1115) SpO2:  [92 %-100 %] 98 % (11/10 1115) Weight:  [353 lb 9.9 oz (160.4 kg)] 353 lb 9.9 oz (160.4 kg) (11/10 0500)  General: Alert and comfortable talking on the telephone  Skin: No rash, splitter or conjunctival hemorrhages  Lungs: Clear  Cor: Regular S1 and S2 with no murmurs  Abdomen: Obese and distended. Goals dressing in place   Lab Results  Recent Labs  08/15/13 0346 08/16/13 0342  WBC 17.9* 15.3*  HGB 11.6* 11.2*  HCT 34.2* 33.5*  NA 130* 132*  K 3.7 4.1  CL 94* 97  CO2 25 26  BUN 36* 40*  CREATININE 1.98* 2.03*   Liver Panel  Recent Labs  08/13/13 2007 08/15/13 0049  PROT 7.1 6.3  ALBUMIN 2.2* 1.9*  AST 80* 53*  ALT 37 27  ALKPHOS 53 40  BILITOT 0.9 1.1  BILIDIR 0.5*  --   IBILI 0.4  --     Microbiology: 11/9 wound cx: NGTD, gram stain GPC in prs and clusters 11/9 blood cx: NGTD  Studies/Results: Ct Abdomen Pelvis Wo Contrast  08/14/2013   CLINICAL DATA:  Fever, leukocytosis, prior hernia repairs and appendectomy  EXAM: CT ABDOMEN  AND PELVIS WITHOUT CONTRAST  TECHNIQUE: Multidetector CT imaging of the abdomen and pelvis was performed following the standard protocol without intravenous contrast.  COMPARISON:  None.  FINDINGS: Note: Given body habitus, the entire anterior abdominal wall (including a portion of the abnormality) could not be included in the field of view despite two separate attempts.  Prior ventral hernia mesh repair. Just anterior to the mesh, within the anterior abdominal wall, is an abdominal wall fluid collection/abscess measuring approximately 14.5 x 18.1 x 26.1 cm (incompletely visualized). Along the superior aspect of the fluid collection, gas extends to immediately beneath the skin surface (series 10/image 27). Bowel loops are displaced posteriorly by the large collection  fluid without definite communication.  A small hernia containing fat and a loop of nondilated colon is present inferiorly (sagittal image 88), to the left of midline (series 10/image 81 and 86).  Minimal left basilar atelectasis.  Cardiomegaly. Coronary atherosclerosis. ICD leads, incompletely visualized.  Moderate hepatic steatosis.  Unenhanced spleen and pancreas are unremarkable.  Low-density nodular thickening of the bilateral adrenal glands, likely reflecting adrenal adenomas.  Gallbladder is notable for layering gallstone. No intrahepatic or extrahepatic ductal dilatation.  Kidneys are unremarkable. No renal calculi or hydronephrosis.  No evidence of bowel obstruction. Prior appendectomy.  Atherosclerotic calcifications of the abdominal aorta and branch vessels.  No abdominopelvic ascites.  No suspicious abdominopelvic lymphadenopathy.  Prostate is unremarkable.  Bladder is within normal limits.  Degenerative changes of the visualized thoracolumbar spine.  IMPRESSION: 14.5 x 18.1 x 26.1 cm fluid collection/abscess within the anterior abdominal wall, as described above, incompletely visualized. The collection appears just anterior to prior ventral hernia mesh.  Given the size and proximity to the skin surface, incision and drainage is suggested.  These results were called by telephone at the time of interpretation on 08/14/2013 at 3:22 PM to Dr. Irine Seal , who verbally acknowledged these results.   Electronically Signed   By: Julian Hy M.D.   On: 08/14/2013 15:28   Dg Chest Portable 1 View  08/15/2013   CLINICAL DATA:  Sepsis, respiratory distress  EXAM: PORTABLE CHEST - 1 VIEW  COMPARISON:  Prior radiograph from 08/13/2013  FINDINGS: Left-sided pacemaker is unchanged. Cardiomegaly is stable as compared to the prior exam.  Lungs are normally inflated. There has been worsening of pulmonary vascular congestion and edema as compared to the prior examination. No definite pleural effusion. No focal  infiltrates appreciated. No pneumothorax.  Osseous structures are unchanged.  IMPRESSION: Stable cardiomegaly with interval worsening of pulmonary vascular congestion/ edema relative to 08/13/2013.   Electronically Signed   By: Jeannine Boga M.D.   On: 08/15/2013 01:16     Assessment/Plan: Abdominal abscess involving abdominal mesh since removed.  - continue with broad spectrum antibiotics for now.  Baxter Flattery [facility] for Infectious Diseases Cell: [phone] Pager: [phone]  08/16/2013, 1:48 PM

## 2013-08-16 NOTE — Progress Notes (Signed)
Subjective:  Patient denies any chest pains or shortness of breath states feels better after the surgery and drainage of approximately 3 L of pus from abdominal wall and removal of mesh  Objective:  Vital Signs in the last 24 hours: Temp:  [96.8 F (36 C)-99.5 F (37.5 C)] 97 F (36.1 C) (11/10 1500) Pulse Rate:  [49-79] 59 (11/10 1515) Resp:  [12-25] 19 (11/10 1515) BP: (45-155)/(18-129) 121/90 mmHg (11/10 1500) SpO2:  [93 %-100 %] 97 % (11/10 1515) Weight:  [160.4 kg (353 lb 9.9 oz)] 160.4 kg (353 lb 9.9 oz) (11/10 0500)  Intake/Output from previous day: 11/09 0701 - 11/10 0700 In: [id number] [P.O.:1620; I.V.:2573; IV Piggyback:650] Out: 985 [Urine:985] Intake/Output from this shift: Total I/O In: 966.9 [I.V.:416.9; IV Piggyback:550] Out: 200 [Urine:200]  Physical Exam: Neck: no adenopathy, no carotid bruit, no JVD and supple, symmetrical, trachea midline Lungs: Decreased breath sound at bases with faint rales Heart: Paced rhythm on the monitor with underline atrial flutter with controlled ventricular response Abdomen: Abdominal binder noted Extremities: extremities normal, atraumatic, no cyanosis or edema  Lab Results:  Recent Labs  08/15/13 0346 08/16/13 0342  WBC 17.9* 15.3*  HGB 11.6* 11.2*  PLT 308 312    Recent Labs  08/15/13 0346 08/16/13 0342  NA 130* 132*  K 3.7 4.1  CL 94* 97  CO2 25 26  GLUCOSE 207* 214*  BUN 36* 40*  CREATININE 1.98* 2.03*    Recent Labs  08/15/13 0049  TROPONINI <0.30   Hepatic Function Panel  Recent Labs  08/13/13 2007 08/15/13 0049  PROT 7.1 6.3  ALBUMIN 2.2* 1.9*  AST 80* 53*  ALT 37 27  ALKPHOS 53 40  BILITOT 0.9 1.1  BILIDIR 0.5*  --   IBILI 0.4  --    No results found for this basename: CHOL,  in the last 72 hours No results found for this basename: PROTIME,  in the last 72 hours  Imaging: Imaging results have been reviewed and Dg Chest Portable 1 View  08/15/2013   CLINICAL DATA:  Sepsis, respiratory  distress  EXAM: PORTABLE CHEST - 1 VIEW  COMPARISON:  Prior radiograph from 08/13/2013  FINDINGS: Left-sided pacemaker is unchanged. Cardiomegaly is stable as compared to the prior exam.  Lungs are normally inflated. There has been worsening of pulmonary vascular congestion and edema as compared to the prior examination. No definite pleural effusion. No focal infiltrates appreciated. No pneumothorax.  Osseous structures are unchanged.  IMPRESSION: Stable cardiomegaly with interval worsening of pulmonary vascular congestion/ edema relative to 08/13/2013.   Electronically Signed   By: Jeannine Boga M.D.   On: 08/15/2013 01:16    Cardiac Studies:  Assessment/Plan: Status post abdominal abscess drainage and removal of mesh Status post hypotensive shock Mild volume overload Acute on chronic kidney disease secondary to hypotension  chronic atrial flutter with controlled ventricular response  Mild coronary artery disease  Mild nonischemic dilated cardiomyopathy  Compensated systolic heart failure  Hypertension  Insulin requiring diabetes mellitus  Morbid obesity  Hypercholesteremia  Obstructive sleep apnea/obesity hypoventilation syndrome  Status post fall Plan Continue present management as per primary team. Agree with reducing fluid to [facility] Monitor renal function Will continue medical Rx for now for atrial flutter.  LOS: 5 days    Kurt Cross 08/16/2013, 4:20 PM

## 2013-08-16 NOTE — Progress Notes (Signed)
I have reviewed this note and agree with all findings. Kati Terisa Belardo, PT, DPT Pager: [phone]   

## 2013-08-16 NOTE — Progress Notes (Signed)
CARE MANAGEMENT NOTE 08/16/2013  Patient:  Kurt Cross,Kurt Cross   Account Number:  [id number]  Date Initiated:  08/12/2013  Documentation initiated by:  [facility]  Subjective/Objective Assessment:   62 Y/O M ADMITTED W/R LEG PAIN,WEAKNESS.PRIOR ADMISSION-10/26-10/30/14-RESP FAILURE.     Action/Plan:   FROM HOME W/SPOUSE.ACTIVE [facility] HHRN/PT.ACTIVE W/THN.HAS PCP,PHARMACY.   Anticipated DC Date:  08/19/2013   Anticipated DC Plan:  [place]  CM consult      Choice offered to / List presented to:             Status of service:  In process, will continue to follow Medicare Important Message given?   (If response is "NO", the following Medicare IM given date fields will be blank) Date Medicare IM given:   Date Additional Medicare IM given:    Discharge Disposition:    Per UR Regulation:  Reviewed for med. necessity/level of care/duration of stay  If discussed at [place] of Stay Meetings, dates discussed:    Comments:  [id number]/Sahith Nurse Eldridge Dace, [place], [place] [phone] Chart Reviewed for discharge and hospital needs. Discharge needs at time of review:  None present will follow for needs. Review of patient progress due on [id number].   08/13/13 KATHY MAHABIR RN,BSN NCM [place] 22-24,02 2LNC.D/C PLAN SNF.  08/12/13 KATHY MAHABIR RN,BSN NCM [phone] PT-SNF.OBSV.MD/SW/[facility] REP NOTIFIED.D/C PLAN SNF.

## 2013-08-16 NOTE — Progress Notes (Signed)
[facility] PROGRESS NOTE  Kurt Cross [id number] DOB: January 13, 1951 DOA: 08/11/2013 PCP: [name], MD  Assessment/Plan: #1 infected prosthetic Gore-Tex mesh with a giant abdominal abscess status post IandD and Gore-Tex mesh removal 08/14/2013 Patient is status post incision and drainage of abscess, Gore-Tex mesh removal, debridement of abdominal wall and peritoneum 08/14/2013. Cultures prelim with GPC in pairs and clusters. Patient on empiric IV Vancomycin and Zosyn. Blood cultures pending. Continue wound care as directed by general surgery. May need a wound VAC. Continue abdominal binder. Per general surgery. ID ff.  #2 sepsis Likely secondary to problem #1. Wound cultures prelim with GPC in pairs and clusters. Blood cultures are pending. Urinalysis was negative. Chest x-Pruitt was negative. On empiric IV Zosyn and IV Vancomycin. ID ff.   #3 hypotension Likely secondary to problem #2. Blood pressure responding to IV fluids. Wound cultures prelim with GPC in pairs and clusters. Blood cultures are pending. Urinalysis is negative. Chest x-Blomgren was negative. Continue empiric IV Zosyn and IV Vancomycin. Fluid boluses as needed.  #4 atrial fibrillation/atrial flutter Likely secondary to problem #1. Patient is currently rate controlled. EKG showed flutter waves. Cardiac enzymes have been negative x3. 2-D echo with EF of 50-55%. Wall motion abnormalities cannot be excluded. Patient CHADS 2 score is about 3. Continue Coreg for rate control. Eliquis was discontinued as patient went for emergent surgery secondary to problem #1. General surgery to advise on anticoagulation, eliquis, may be resumed. Patient was to be scheduled for TEE cardioversion today however in light of problem #1 this may be postponed. Will defer to cardiology. Cardiology is following and appreciate input and recommendations.  #5 right lower extremity pain Secondary to fall. Pain is more in the posterior thigh/hamstrings and  posterior Likely musculoskeletal in nature. Patient with clinical improvement. Continue warm compresses. PT/OT. Follow  #6 leukocytosis/fever Likely secondary to problem #1. Patient with a temp of 96.8. Currently afebrile. White count is slowly trending down. Chest x-Klindt is negative. Patient with no respiratory symptoms. Urinalysis is negative. Blood cultures are pending. Wound cultures prelim with mod GPC in pairs and clusters. IV vancomycin started yesterday per ID. Continue empiric IV Zosyn. ID ff.   #7 chronic combined systolic and diastolic CHF Stable. Lasix on hold secondary to hypotension. Continue Coreg.  #8 chronic kidney disease stage III Creatinine is trending up. ACE inhibitor and Lasix have been discontinued. Gentle hydration. Follow.  #9 hyperlipidemia Continue statin.  #10   4.5 cm ascending aortic aneurysm per CT scan of 08/01/2013 Stable. Asymptomatic. Continue risk factor modification with good blood pressure control. Will need outpatient followup with vascular surgery as outpatient.  #11  hypotension Patient noted to be hypotensive postoperatively. Continue to hold Lasix. IVF. Responding to fluids. Fluid boluses.   Neosynephrine off.  #12  type 2 diabetes Hemoglobin A1c was 9.5 on 08/02/2013. CBGs have ranged from 183-200. Increase Lantus to  65 units daily. Sliding scale insulin.  #13 Prophylaxis SCDs for DVT prophylaxis.   Code Status: Full Family Communication: Updated patient at bedside. Disposition Plan: Stay in SDU, SNF when medically stable.   Consultants:  Cardiology: Dr. Terrence Dupont 08/13/2013  General surgery: Dr. Johney Maine 08/14/2013  ID: Dr Megan Salon 08/15/13  Procedures:  2-D echo  08/12/2013  X-Stamey of the right hip 08/11/2013  CT abdomen and pelvis pending  Incision and drainage of abscess, Gore-Tex mesh removal, debridement of abdominal wall and peritoneum  Antibiotics:  IV Zosyn 08/14/2013  IV Vancomycin  08/15/13  HPI/Subjective: Patient states leg pain  improved. Patient asymptomatic denies any chest pain. No shortness of breath. Patient states he's feeling better. No complaints. Patient noted to require neosynephrine last night. Pressors off today.  Objective: Filed Vitals:   08/16/13 0815  BP: 87/38  Pulse: 59  Temp: 96.8 F (36 C)  Resp: 13    Intake/Output Summary (Last 24 hours) at 08/16/13 0931 Last data filed at 08/16/13 0800  Gross per 24 hour  Intake 4581.8 ml  Output    860 ml  Net 3721.8 ml   Filed Weights   08/13/13 0657 08/14/13 0540 08/16/13 0500  Weight: 156 kg (343 lb 14.7 oz) 156.5 kg (345 lb 0.3 oz) 160.4 kg (353 lb 9.9 oz)    Exam:   General:  NAD  Cardiovascular: irreg irreg  Respiratory: CTAB anterior lung fields  Abdomen: Soft/NT/+BS/ Bandage on abdomen  Musculoskeletal: No c/c/e  Data Reviewed: Basic Metabolic Panel:  Recent Labs Lab 08/13/13 0512 08/14/13 0355 08/15/13 0049 08/15/13 0346 08/16/13 0342  NA 132* 130* 130* 130* 132*  K 4.2 4.0 3.5 3.7 4.1  CL 93* 91* 93* 94* 97  CO2 27 26 25 25 26   GLUCOSE 166* 162* 171* 207* 214*  BUN 27* 32* 36* 36* 40*  CREATININE 1.52* 1.94* 1.93* 1.98* 2.03*  CALCIUM 10.1 9.8 8.7 8.5 8.9  MG  --   --   --   --  1.9   Liver Function Tests:  Recent Labs Lab 08/13/13 2007 08/15/13 0049  AST 80* 53*  ALT 37 27  ALKPHOS 53 40  BILITOT 0.9 1.1  PROT 7.1 6.3  ALBUMIN 2.2* 1.9*   No results found for this basename: LIPASE, AMYLASE,  in the last 168 hours No results found for this basename: AMMONIA,  in the last 168 hours CBC:  Recent Labs Lab 08/13/13 0512 08/14/13 0355 08/15/13 0049 08/15/13 0346 08/16/13 0342  WBC 18.4* 19.6* 18.9* 17.9* 15.3*  NEUTROABS 14.8*  --   --   --  11.9*  HGB 13.3 13.1 11.7* 11.6* 11.2*  HCT 39.1 37.9* 34.3* 34.2* 33.5*  MCV 95.6 95.7 94.8 94.2 96.5  PLT 363 325 324 308 312   Cardiac Enzymes:  Recent Labs Lab 08/11/13 1954 08/12/13 0050  08/12/13 0655 08/15/13 0049  TROPONINI <0.30 <0.30 <0.30 <0.30   BNP (last 3 results)  Recent Labs  08/24/12 1050 08/27/12 0500 08/01/13 1649  PROBNP 1036.0* 253.4* 481.4*   CBG:  Recent Labs Lab 08/15/13 1641 08/15/13 2051 08/15/13 2351 08/16/13 0156 08/16/13 0831  GLUCAP 279* 203* 198* 200* 183*    Recent Results (from the past 240 hour(s))  URINE CULTURE     Status: None   Collection Time    08/11/13  6:24 PM      Result Value Range Status   Specimen Description URINE, CLEAN CATCH   Final   Special Requests NONE   Final   Culture  Setup Time     Final   Value: 08/12/2013 02:25     Performed at [place]     Final   Value: NO GROWTH     Performed at [facility]   Culture     Final   Value: NO GROWTH     Performed at [facility]   Report Status 08/13/2013 FINAL   Final  CULTURE, BLOOD (ROUTINE X 2)     Status: None   Collection Time    08/13/13  2:55 PM  Result Value Range Status   Specimen Description BLOOD LEFT HAND   Final   Special Requests BOTTLES DRAWN AEROBIC ONLY 10CC   Final   Culture  Setup Time     Final   Value: 08/13/2013 22:30     Performed at [facility]   Culture     Final   Value:        BLOOD CULTURE RECEIVED NO GROWTH TO DATE CULTURE WILL BE HELD FOR 5 DAYS BEFORE ISSUING A FINAL NEGATIVE REPORT     Performed at [facility]   Report Status PENDING   Incomplete  CULTURE, BLOOD (ROUTINE X 2)     Status: None   Collection Time    08/13/13  3:00 PM      Result Value Range Status   Specimen Description BLOOD LEFT ARM   Final   Special Requests BOTTLES DRAWN AEROBIC AND ANAEROBIC 10CC   Final   Culture  Setup Time     Final   Value: 08/13/2013 22:31     Performed at [facility]   Culture     Final   Value:        BLOOD CULTURE RECEIVED NO GROWTH TO DATE CULTURE WILL BE HELD FOR 5 DAYS BEFORE ISSUING A FINAL NEGATIVE REPORT     Performed at [facility]    Report Status PENDING   Incomplete  URINE CULTURE     Status: None   Collection Time    08/13/13  3:11 PM      Result Value Range Status   Specimen Description URINE, CLEAN CATCH   Final   Special Requests NONE   Final   Culture  Setup Time     Final   Value: 08/14/2013 06:01     Performed at [facility]     Final   Value: NO GROWTH     Performed at [facility]   Culture     Final   Value: NO GROWTH     Performed at [facility]   Report Status 08/15/2013 FINAL   Final  SURGICAL PCR SCREEN     Status: None   Collection Time    08/14/13  4:35 PM      Result Value Range Status   MRSA, PCR NEGATIVE  NEGATIVE Final   Staphylococcus aureus NEGATIVE  NEGATIVE Final   Comment:            The Xpert SA Assay (FDA     approved for NASAL specimens     in patients over 54 years of age),     is one component of     a comprehensive surveillance     program.  Test performance has     been validated by [employer] for patients greater     than or equal to 64 year old.     It is not intended     to diagnose infection nor to     guide or monitor treatment.  CULTURE, ROUTINE-ABSCESS     Status: None   Collection Time    08/14/13  6:28 PM      Result Value Range Status   Specimen Description ABDOMEN INCISION DRAINAGE   Final   Special Requests NONE   Final   Gram Stain     Final   Value: ABUNDANT WBC PRESENT, PREDOMINANTLY PMN     NO SQUAMOUS EPITHELIAL CELLS  SEEN     MODERATE GRAM POSITIVE COCCI IN PAIRS     IN CLUSTERS     Performed at [facility]   Culture     Final   Value: NO GROWTH 1 DAY     Performed at [facility]   Report Status PENDING   Incomplete  CULTURE, BLOOD (ROUTINE X 2)     Status: None   Collection Time    08/15/13 12:40 AM      Result Value Range Status   Specimen Description BLOOD LEFT ARM   Final   Special Requests BOTTLES DRAWN AEROBIC AND ANAEROBIC 5CC   Final   Culture  Setup Time     Final    Value: 08/15/2013 13:18     Performed at [facility]   Culture     Final   Value:        BLOOD CULTURE RECEIVED NO GROWTH TO DATE CULTURE WILL BE HELD FOR 5 DAYS BEFORE ISSUING A FINAL NEGATIVE REPORT     Performed at [facility]   Report Status PENDING   Incomplete  CULTURE, BLOOD (ROUTINE X 2)     Status: None   Collection Time    08/15/13 12:50 AM      Result Value Range Status   Specimen Description BLOOD LEFT ARM   Final   Special Requests BOTTLES DRAWN AEROBIC AND ANAEROBIC 5CC   Final   Culture  Setup Time     Final   Value: 08/15/2013 13:18     Performed at [facility]   Culture     Final   Value:        BLOOD CULTURE RECEIVED NO GROWTH TO DATE CULTURE WILL BE HELD FOR 5 DAYS BEFORE ISSUING A FINAL NEGATIVE REPORT     Performed at [facility]   Report Status PENDING   Incomplete  CLOSTRIDIUM DIFFICILE BY PCR     Status: None   Collection Time    08/15/13  9:04 AM      Result Value Range Status   C difficile by pcr NEGATIVE  NEGATIVE Final   Comment: Performed at [facility]     Studies: Ct Abdomen Pelvis Wo Contrast  08/14/2013   CLINICAL DATA:  Fever, leukocytosis, prior hernia repairs and appendectomy  EXAM: CT ABDOMEN AND PELVIS WITHOUT CONTRAST  TECHNIQUE: Multidetector CT imaging of the abdomen and pelvis was performed following the standard protocol without intravenous contrast.  COMPARISON:  None.  FINDINGS: Note: Given body habitus, the entire anterior abdominal wall (including a portion of the abnormality) could not be included in the field of view despite two separate attempts.  Prior ventral hernia mesh repair. Just anterior to the mesh, within the anterior abdominal wall, is an abdominal wall fluid collection/abscess measuring approximately 14.5 x 18.1 x 26.1 cm (incompletely visualized). Along the superior aspect of the fluid collection, gas extends to immediately beneath the skin surface (series 10/image 27). Bowel  loops are displaced posteriorly by the large collection fluid without definite communication.  A small hernia containing fat and a loop of nondilated colon is present inferiorly (sagittal image 88), to the left of midline (series 10/image 81 and 86).  Minimal left basilar atelectasis.  Cardiomegaly. Coronary atherosclerosis. ICD leads, incompletely visualized.  Moderate hepatic steatosis.  Unenhanced spleen and pancreas are unremarkable.  Low-density nodular thickening of the bilateral adrenal glands, likely reflecting adrenal adenomas.  Gallbladder is notable for layering gallstone. No intrahepatic  or extrahepatic ductal dilatation.  Kidneys are unremarkable. No renal calculi or hydronephrosis.  No evidence of bowel obstruction. Prior appendectomy.  Atherosclerotic calcifications of the abdominal aorta and branch vessels.  No abdominopelvic ascites.  No suspicious abdominopelvic lymphadenopathy.  Prostate is unremarkable.  Bladder is within normal limits.  Degenerative changes of the visualized thoracolumbar spine.  IMPRESSION: 14.5 x 18.1 x 26.1 cm fluid collection/abscess within the anterior abdominal wall, as described above, incompletely visualized. The collection appears just anterior to prior ventral hernia mesh.  Given the size and proximity to the skin surface, incision and drainage is suggested.  These results were called by telephone at the time of interpretation on 08/14/2013 at 3:22 PM to Dr. Irine Seal , who verbally acknowledged these results.   Electronically Signed   By: Julian Hy M.D.   On: 08/14/2013 15:28   Dg Chest Portable 1 View  08/15/2013   CLINICAL DATA:  Sepsis, respiratory distress  EXAM: PORTABLE CHEST - 1 VIEW  COMPARISON:  Prior radiograph from 08/13/2013  FINDINGS: Left-sided pacemaker is unchanged. Cardiomegaly is stable as compared to the prior exam.  Lungs are normally inflated. There has been worsening of pulmonary vascular congestion and edema as compared to the  prior examination. No definite pleural effusion. No focal infiltrates appreciated. No pneumothorax.  Osseous structures are unchanged.  IMPRESSION: Stable cardiomegaly with interval worsening of pulmonary vascular congestion/ edema relative to 08/13/2013.   Electronically Signed   By: Jeannine Boga M.D.   On: 08/15/2013 01:16    Scheduled Meds: . acetaminophen  1,000 mg Oral TID  . atorvastatin  40 mg Oral QODAY  . carvedilol  6.25 mg Oral BID WC  . insulin aspart  0-20 Units Subcutaneous Q4H  . insulin glargine  60 Units Subcutaneous QHS  . lip balm  1 application Topical BID  . piperacillin-tazobactam (ZOSYN)  IV  3.375 g Intravenous Q8H  . psyllium  1 packet Oral Daily  . saccharomyces boulardii  250 mg Oral BID  . sodium chloride  500 mL Intravenous Once  . sodium chloride  3 mL Intravenous Q12H  . vancomycin  2,000 mg Intravenous Q24H   Continuous Infusions: . sodium chloride 100 mL/hr at 08/16/13 0752  . norepinephrine (LEVOPHED) Adult infusion Stopped (08/16/13 0800)    Principal Problem:   Infected prosthetic mesh of abdominal wall with GIANT abscess s/p removal 08/14/2013 Active Problems:   Diabetes mellitus, insulin dependent (IDDM), uncontrolled   HYPERLIPIDEMIA   Obesity, Class III, BMI 40-49.9 (morbid obesity)   OBSTRUCTIVE SLEEP APNEA   Hypertension   Acute-on-chronic respiratory failure secondary to probable community-acquired pneumonia   Fever   Sepsis   Constipation, chronic   Chronic combined systolic and diastolic CHF (congestive heart failure)   Right leg pain   Leukocytosis   AAA (abdominal aortic aneurysm)/ 4.5 cm ascending per CT angio 08/01/13   Recurrent ventral incisional hernia   Acute renal failure (ARF)    Time spent: 35 mins    [facility] Pager [phone]. If 7PM-7AM, please contact night-coverage at [email or web address], password [facility] 08/16/2013, 9:31 AM  LOS: 5 days

## 2013-08-16 NOTE — Progress Notes (Signed)
PULMONARY  / CRITICAL CARE MEDICINE  Name: Kurt Cross MRN: [id number] DOB: 1951/08/15    ADMISSION DATE:  08/11/2013 CONSULTATION DATE:  08/14/2013  REFERRING MD :  Dr. Grandville Silos PRIMARY SERVICE: [facility]  CHIEF COMPLAINT:  Hypotension   BRIEF PATIENT DESCRIPTION: 19 M with CHF, DM and large abdominal wall abscess with fluid-responsive hypotension. PCCM consulted for evaluation and possible CVC placement. Required neo gtt  SIGNIFICANT EVENTS / STUDIES:  1. Abdominal Wall Debridement 11/8  LINES / TUBES: 1. PIV  CULTURES: 1. 11-9Wound Culture Pending>> 2. 11-9 bcx 2>>ng 3. 11-9 uc>>ng  ANTIBIOTICS: 1. Zosyn 11/8>>  HISTORY OF PRESENT ILLNESS:  Kurt Cross is a 37 M with SCHF (EF 50-55%), DM, recent admission for CAP, and a large abdominal wall abscess s/p drainage 11/8. Afterwards, Kurt Cross developed hypotension which subsequently responded to fluid boluses. Currently, his only complaint is abdominal pain. He denies lightheadedness, SOB, and CP. He is tolerating CPAP.  SUBJ - denies dizziness, cp, dyspnea  VITAL SIGNS: Temp:  [96.8 F (36 C)-99.7 F (37.6 C)] 96.8 F (36 C) (11/10 0815) Pulse Rate:  [49-79] 59 (11/10 0815) Resp:  [13-27] 13 (11/10 0815) BP: (45-155)/(26-129) 87/38 mmHg (11/10 0815) SpO2:  [91 %-100 %] 100 % (11/10 0815) Weight:  [353 lb 9.9 oz (160.4 kg)] 353 lb 9.9 oz (160.4 kg) (11/10 0500)  HEMODYNAMICS:    VENTILATOR SETTINGS:    INTAKE / OUTPUT: Intake/Output     11/09 0701 - 11/10 0700 11/10 0701 - 11/11 0700   P.O. 1620    I.V. (mL/kg) 2573 (16) 111.3 (0.7)   Other 90    IV Piggyback 650 262.5   Total Intake(mL/kg) 4933 (30.8) 373.8 (2.3)   Urine (mL/kg/hr) 985 (0.3)    Other     Blood     Total Output 985     Net +3948 +373.8        Stool Occurrence 1 x      PHYSICAL EXAMINATION: General:  Obese M in NAD Neuro:  Intact HEENT:  BNo JVD/LAN Neck:  Trachea supple and midline. Cardiovascular:  RRR, NS1/S2, (-) MRG Lungs:  CTAB,  diminished in bases Abdomen:  Obese. Abdominal Binder present; binder C/D/I Musculoskeletal:  (-) C/C/E Skin:  (-) Rash ++le edema  LABS:  CBC Recent Labs     08/15/13  0049  08/15/13  0346  08/16/13  0342  WBC  18.9*  17.9*  15.3*  HGB  11.7*  11.6*  11.2*  HCT  34.3*  34.2*  33.5*  PLT  324  308  312    Coag's Recent Labs     08/13/13  2009  08/15/13  0049  APTT   --   43*  INR  1.31  1.48    BMET Recent Labs     08/15/13  0049  08/15/13  0346  08/16/13  0342  NA  130*  130*  132*  K  3.5  3.7  4.1  CL  93*  94*  97  CO2  25  25  26   BUN  36*  36*  40*  CREATININE  1.93*  1.98*  2.03*  GLUCOSE  171*  207*  214*    Electrolytes Recent Labs     08/15/13  0049  08/15/13  0346  08/16/13  0342  CALCIUM  8.7  8.5  8.9  MG   --    --   1.9    Sepsis Markers No results found for  this basename: LACTICACIDVEN, PROCALCITON, O2SATVEN,  in the last 72 hours  ABG No results found for this basename: PHART, PCO2ART, PO2ART,  in the last 72 hours  Liver Enzymes Recent Labs     08/13/13  2007  08/15/13  0049  AST  80*  53*  ALT  37  27  ALKPHOS  53  40  BILITOT  0.9  1.1  ALBUMIN  2.2*  1.9*    Cardiac Enzymes Recent Labs     08/15/13  0049  TROPONINI  <0.30    Glucose Recent Labs     08/15/13  1141  08/15/13  1641  08/15/13  2051  08/15/13  2351  08/16/13  0156  08/16/13  0831  GLUCAP  261*  279*  203*  198*  200*  183*    Imaging Ct Abdomen Pelvis Wo Contrast  08/14/2013   CLINICAL DATA:  Fever, leukocytosis, prior hernia repairs and appendectomy  EXAM: CT ABDOMEN AND PELVIS WITHOUT CONTRAST  TECHNIQUE: Multidetector CT imaging of the abdomen and pelvis was performed following the standard protocol without intravenous contrast.  COMPARISON:  None.  FINDINGS: Note: Given body habitus, the entire anterior abdominal wall (including a portion of the abnormality) could not be included in the field of view despite two separate attempts.  Prior  ventral hernia mesh repair. Just anterior to the mesh, within the anterior abdominal wall, is an abdominal wall fluid collection/abscess measuring approximately 14.5 x 18.1 x 26.1 cm (incompletely visualized). Along the superior aspect of the fluid collection, gas extends to immediately beneath the skin surface (series 10/image 27). Bowel loops are displaced posteriorly by the large collection fluid without definite communication.  A small hernia containing fat and a loop of nondilated colon is present inferiorly (sagittal image 88), to the left of midline (series 10/image 81 and 86).  Minimal left basilar atelectasis.  Cardiomegaly. Coronary atherosclerosis. ICD leads, incompletely visualized.  Moderate hepatic steatosis.  Unenhanced spleen and pancreas are unremarkable.  Low-density nodular thickening of the bilateral adrenal glands, likely reflecting adrenal adenomas.  Gallbladder is notable for layering gallstone. No intrahepatic or extrahepatic ductal dilatation.  Kidneys are unremarkable. No renal calculi or hydronephrosis.  No evidence of bowel obstruction. Prior appendectomy.  Atherosclerotic calcifications of the abdominal aorta and branch vessels.  No abdominopelvic ascites.  No suspicious abdominopelvic lymphadenopathy.  Prostate is unremarkable.  Bladder is within normal limits.  Degenerative changes of the visualized thoracolumbar spine.  IMPRESSION: 14.5 x 18.1 x 26.1 cm fluid collection/abscess within the anterior abdominal wall, as described above, incompletely visualized. The collection appears just anterior to prior ventral hernia mesh.  Given the size and proximity to the skin surface, incision and drainage is suggested.  These results were called by telephone at the time of interpretation on 08/14/2013 at 3:22 PM to Dr. Irine Seal , who verbally acknowledged these results.   Electronically Signed   By: Julian Hy M.D.   On: 08/14/2013 15:28   Dg Chest Portable 1 View  08/15/2013    CLINICAL DATA:  Sepsis, respiratory distress  EXAM: PORTABLE CHEST - 1 VIEW  COMPARISON:  Prior radiograph from 08/13/2013  FINDINGS: Left-sided pacemaker is unchanged. Cardiomegaly is stable as compared to the prior exam.  Lungs are normally inflated. There has been worsening of pulmonary vascular congestion and edema as compared to the prior examination. No definite pleural effusion. No focal infiltrates appreciated. No pneumothorax.  Osseous structures are unchanged.  IMPRESSION: Stable cardiomegaly with interval worsening of pulmonary  vascular congestion/ edema relative to 08/13/2013.   Electronically Signed   By: Jeannine Boga M.D.   On: 08/15/2013 01:16    ASSESSMENT / PLAN: Principal Problem:   Infected prosthetic mesh of abdominal wall with GIANT abscess s/p removal 08/14/2013 Active Problems:   Diabetes mellitus, insulin dependent (IDDM), uncontrolled   HYPERLIPIDEMIA   Obesity, Class III, BMI 40-49.9 (morbid obesity)   OBSTRUCTIVE SLEEP APNEA   Hypertension   Acute-on-chronic respiratory failure secondary to probable community-acquired pneumonia   Fever   Sepsis   Constipation, chronic   Chronic combined systolic and diastolic CHF (congestive heart failure)   Right leg pain   Leukocytosis   AAA (abdominal aortic aneurysm)/ 4.5 cm ascending per CT angio 08/01/13   Recurrent ventral incisional hernia   Acute renal failure (ARF)  1. Severe Sepsis: . 11-10 off pressors  Agree with Vanco/ Zosyn; may need to adjust based on wound cultures  dc fluid  No indication for CVC  Hold antihypertensives(coreg ) until remains off pressors x 24h  Nocturnal Bipap he has OSA  2. CHF - low dose lasix if BP remains ok, dc IVFs    TODAY'S SUMMARY:  Wake and alert. Off pressors. Responding to abx/IVF. Hold coreg till stable.  PCCM to sign off    Marnae Madani V.  [phone] 08/16/2013, 10:08 AM

## 2013-08-16 NOTE — Progress Notes (Signed)
2 Days Post-Op  Subjective: Pt doing well this am.  Pain is well controlled on orals and IV, tolerated full liquids yesterday, and switched by primary to [place] this am.  He is tolerating this well.  Tolerated dressing change this am.  Hasn't ambulated yet.  Foley still in place and still requiring levo for pressure support.  WBC improving down to 15.3.  Cr. up to 2.03.  Objective: Vital signs in last 24 hours: Temp:  [96.8 F (36 C)-99.7 F (37.6 C)] 96.8 F (36 C) (11/10 0815) Pulse Rate:  [49-79] 59 (11/10 0815) Resp:  [10-27] 13 (11/10 0815) BP: (45-155)/(26-129) 87/38 mmHg (11/10 0815) SpO2:  [91 %-100 %] 100 % (11/10 0815) Weight:  [353 lb 9.9 oz (160.4 kg)] 353 lb 9.9 oz (160.4 kg) (11/10 0500) Last BM Date: 08/15/13  Intake/Output from previous day: 11/09 0701 - 11/10 0700 In: 4933 [P.O.:1620; I.V.:2573; IV Piggyback:650] Out: 985 [Urine:985] Intake/Output this shift: Total I/O In: 373.8 [I.V.:111.3; IV Piggyback:262.5] Out: -   PE: Gen:  Alert, NAD, pleasant Abd: Largely obese, soft, mild tenderness, +distension, +BS, no HSM, pulled back only top of dressing back which is clean since changed this am, packing not significantly saturated, skin is minimally erythematous and not significantly tender  Lab Results:   Recent Labs  08/15/13 0346 08/16/13 0342  WBC 17.9* 15.3*  HGB 11.6* 11.2*  HCT 34.2* 33.5*  PLT 308 312   BMET  Recent Labs  08/15/13 0346 08/16/13 0342  NA 130* 132*  K 3.7 4.1  CL 94* 97  CO2 25 26  GLUCOSE 207* 214*  BUN 36* 40*  CREATININE 1.98* 2.03*  CALCIUM 8.5 8.9   PT/INR  Recent Labs  08/13/13 2009 08/15/13 0049  LABPROT 16.0* 17.5*  INR 1.31 1.48   CMP     Component Value Date/Time   NA 132* 08/16/2013 0342   K 4.1 08/16/2013 0342   CL 97 08/16/2013 0342   CO2 26 08/16/2013 0342   GLUCOSE 214* 08/16/2013 0342   BUN 40* 08/16/2013 0342   CREATININE 2.03* 08/16/2013 0342   CALCIUM 8.9 08/16/2013 0342   PROT 6.3  08/15/2013 0049   ALBUMIN 1.9* 08/15/2013 0049   AST 53* 08/15/2013 0049   ALT 27 08/15/2013 0049   ALKPHOS 40 08/15/2013 0049   BILITOT 1.1 08/15/2013 0049   GFRNONAA 34* 08/16/2013 0342   GFRAA 39* 08/16/2013 0342   Lipase  No results found for this basename: lipase       Studies/Results: Ct Abdomen Pelvis Wo Contrast  08/14/2013   CLINICAL DATA:  Fever, leukocytosis, prior hernia repairs and appendectomy  EXAM: CT ABDOMEN AND PELVIS WITHOUT CONTRAST  TECHNIQUE: Multidetector CT imaging of the abdomen and pelvis was performed following the standard protocol without intravenous contrast.  COMPARISON:  None.  FINDINGS: Note: Given body habitus, the entire anterior abdominal wall (including a portion of the abnormality) could not be included in the field of view despite two separate attempts.  Prior ventral hernia mesh repair. Just anterior to the mesh, within the anterior abdominal wall, is an abdominal wall fluid collection/abscess measuring approximately 14.5 x 18.1 x 26.1 cm (incompletely visualized). Along the superior aspect of the fluid collection, gas extends to immediately beneath the skin surface (series 10/image 27). Bowel loops are displaced posteriorly by the large collection fluid without definite communication.  A small hernia containing fat and a loop of nondilated colon is present inferiorly (sagittal image 88), to the left of midline (series  10/image 81 and 86).  Minimal left basilar atelectasis.  Cardiomegaly. Coronary atherosclerosis. ICD leads, incompletely visualized.  Moderate hepatic steatosis.  Unenhanced spleen and pancreas are unremarkable.  Low-density nodular thickening of the bilateral adrenal glands, likely reflecting adrenal adenomas.  Gallbladder is notable for layering gallstone. No intrahepatic or extrahepatic ductal dilatation.  Kidneys are unremarkable. No renal calculi or hydronephrosis.  No evidence of bowel obstruction. Prior appendectomy.  Atherosclerotic  calcifications of the abdominal aorta and branch vessels.  No abdominopelvic ascites.  No suspicious abdominopelvic lymphadenopathy.  Prostate is unremarkable.  Bladder is within normal limits.  Degenerative changes of the visualized thoracolumbar spine.  IMPRESSION: 14.5 x 18.1 x 26.1 cm fluid collection/abscess within the anterior abdominal wall, as described above, incompletely visualized. The collection appears just anterior to prior ventral hernia mesh.  Given the size and proximity to the skin surface, incision and drainage is suggested.  These results were called by telephone at the time of interpretation on 08/14/2013 at 3:22 PM to Dr. Irine Seal , who verbally acknowledged these results.   Electronically Signed   By: Julian Hy M.D.   On: 08/14/2013 15:28   Dg Chest Portable 1 View  08/15/2013   CLINICAL DATA:  Sepsis, respiratory distress  EXAM: PORTABLE CHEST - 1 VIEW  COMPARISON:  Prior radiograph from 08/13/2013  FINDINGS: Left-sided pacemaker is unchanged. Cardiomegaly is stable as compared to the prior exam.  Lungs are normally inflated. There has been worsening of pulmonary vascular congestion and edema as compared to the prior examination. No definite pleural effusion. No focal infiltrates appreciated. No pneumothorax.  Osseous structures are unchanged.  IMPRESSION: Stable cardiomegaly with interval worsening of pulmonary vascular congestion/ edema relative to 08/13/2013.   Electronically Signed   By: Jeannine Boga M.D.   On: 08/15/2013 01:16    Anti-infectives: Anti-infectives   Start     Dose/Rate Route Frequency Ordered Stop   08/16/13 0800  vancomycin (VANCOCIN) 2,000 mg in sodium chloride 0.9 % 500 mL IVPB     2,000 mg 250 mL/hr over 120 Minutes Intravenous Every 24 hours 08/15/13 1411     08/15/13 1500  vancomycin (VANCOCIN) 2,000 mg in sodium chloride 0.9 % 500 mL IVPB     2,000 mg 250 mL/hr over 120 Minutes Intravenous  Once 08/15/13 1411 08/15/13 1713    08/14/13 1600  piperacillin-tazobactam (ZOSYN) IVPB 3.375 g     3.375 g 12.5 mL/hr over 240 Minutes Intravenous Every 8 hours 08/14/13 1543         Assessment/Plan Giant abdominal abscess with infected old GoreTex mesh POD #2 s/p I&D abscess, removal of mesh, debridement of abdominal wall & peritoneum Sepsis 2* above- still requiring Levo Acute on chronic kidney disease Stage III - Cr. Trending up  Plan: 1.  Continue BID packing of wound, at some point can switch to wound vac, will need white/bowel protective sponge with SB exposed, doesn't have abdominal binder yet due to supply not having one large enough 2.  Cont IV abx (Vancomycin and Zosyn), f/u cultures gram stain gram + cocci in pairs 3.  Mobilize to help with recovery & IS 4.  DVT proph - SCD's and not currently on Heparin or Lovenox, likely okay to start prophylaxis 5.  Would continue foley for now given still on levo to maintain pressures, may be able to d/c later today 6.  Dressing changed at 0600, will change again between 3-4pm 7.  Tolerating HH diet this am    LOS: 5  days    Coralie Keens 08/16/2013, 8:38 AM Pager: [phone]

## 2013-08-16 NOTE — Progress Notes (Signed)
Physical Therapy Treatment Patient Details Name: Kurt Cross MRN: [id number] DOB: 1951/01/15 Today's Date: 08/16/2013 Time: [id number] PT Time Calculation (min): 25 min  PT Assessment / Plan / Recommendation  History of Present Illness Kurt Cross is a 62 y.o. male  with known history of chronic systolic heart failure status post defibrillator placement last EF measured was in 2013 was 35-40%, on home oxygen, OSA noncompliant with CPAP, diabetes mellitus, hypertension and hyperlipidemia presented to the ER because right leg pain after sustaining a fall the night prior to admission.  08/14/13: s/p abdominal abscess removal   PT Comments   Pt limited by pain today as he is now s/p abdominal abscess removal (08/14/13). Pt motivated to transfer from bed to chair and perform LE exercises. +2 assist to ensure safety and manage lines during transfer. Pt progressing as PT was able to update bed mobility goal as pt demonstrated good technique today.  Pt would continue to benefit from skilled PT to improve functional mobility and safety.  Follow Up Recommendations  SNF     Does the patient have the potential to tolerate intense rehabilitation     Barriers to Discharge        Equipment Recommendations  None recommended by PT    Recommendations for Other Services    Frequency Min 3X/week   Progress towards PT Goals Progress towards PT goals: Progressing toward goals  Plan Current plan remains appropriate    Precautions / Restrictions Precautions Precautions: Fall Restrictions Weight Bearing Restrictions: No   Pertinent Vitals/Pain Pt reports 4-5/10 abdominal at rest with slight increase during mobility, RN gave pt pain meds prior to PT session and pt positioned to comfort at end of session. VSS during session.    Mobility  Bed Mobility Bed Mobility: Rolling Left;Left Sidelying to Sit;Sitting - Scoot to [employer] of Bed Rolling Left: 4: Min guard Left Sidelying to Sit: 4: Min assist;HOB  elevated Sitting - Scoot to [employer] of Bed: 4: Min guard Details for Bed Mobility Assistance: min guard to ensure safety and min assist to guide trunk into sitting position. pt required increased time due to abdominal pain. Transfers Transfers: Sit to Stand;Stand to Sit;Stand Pivot Transfers Sit to Stand: 3: Mod assist;From bed;From elevated surface;With upper extremity assist Stand to Sit: 4: Min assist;To chair/3-in-1;With upper extremity assist;With armrests Stand Pivot Transfers: 4: Min assist Details for Transfer Assistance: +2 assist to ensure safety and to manage lines. assist to rise and steady and to control descent to chair. pt able to take 2-3 steps forward before turning to sit in chair with RW and min assist. VC's for hand placement. Ambulation/Gait Ambulation/Gait Assistance: Not tested (comment) Ambulation/Gait Assistance Details: pt had increased pain upon standing and was not able to stand fully upright, will try amb next visit.    Exercises General Exercises - Lower Extremity Ankle Circles/Pumps: PROM;20 reps;Supine;Seated;Both (x20 seated and x20 in supine) Short Arc Quad: AROM;20 reps;Supine;Both (pt required increased time for all exercises due to fatigue and pain) Long Arc Quad: 20 reps;AROM;Seated;Both Hip Flexion/Marching: 20 reps;AROM;Seated;Both   PT Diagnosis:    PT Problem List:   PT Treatment Interventions:     PT Goals (current goals can now be found in the care plan section)    Visit Information  Last PT Received On: 08/16/13 Assistance Needed: +2 (for safety and to manage lines) History of Present Illness: Kurt Cross is a 62 y.o. male  with known history of chronic systolic heart failure status post  defibrillator placement last EF measured was in 2013 was 35-40%, on home oxygen, OSA noncompliant with CPAP, diabetes mellitus, hypertension and hyperlipidemia presented to the ER because right leg pain after sustaining a fall the night prior to admission.   08/14/13: s/p abdominal abscess removal    Subjective Data      Cognition  Cognition Arousal/Alertness: Awake/alert Behavior During Therapy: WFL for tasks assessed/performed Overall Cognitive Status: Within Functional Limits for tasks assessed    Balance     End of Session PT - End of Session Equipment Utilized During Treatment: Oxygen Activity Tolerance: Patient limited by pain Patient left: with call bell/phone within reach;in chair Nurse Communication: Mobility status   GP     Kurt Cross 08/16/2013, 10:37 AM

## 2013-08-16 NOTE — Consult Note (Signed)
   Patient seen in conjunction with [facility] physician Redmond Pulling) and PA Creig Hines) for assessment and evaluation for NPWT.  Measurements taken by Creig Hines, PA and photo taken by Redmond Pulling, MD.and attached here as a learning exercise. Measurements are:: 23 x 9 x 3.5cm Undermining: 3 cm at 12:00 o'clock  7.5 cm at 3 o'clock  6 cm at 6 o'clock  5 cm at 9 o'clock Wound bed is as above, 60% pink granulation tissue over exposed bowel 40%.  I do not believe that this wound is appropriate for NPWT at this time, both due to the degree of necrotic tissue and the degree of guttering  At periphery. We will continue with twice daily saline dressings, saturating gauze prior to removal.  [facility] would like to view wound at am dressing change. [facility] nursing team will follow along with you, but note that [facility] is primary on this case and we defer to their expertise. We will remain available to this patient, the nursing and medical team.  Please re-consult if needed. Thanks, Maudie Flakes, MSN, RN, [place], [place], [place] [phone])

## 2013-08-16 NOTE — Progress Notes (Signed)
Seen and agree  Kurt Cross. Redmond Pulling, MD, FACS General, Bariatric, & Minimally Invasive Surgery [facility], [place]

## 2013-08-17 ENCOUNTER — Inpatient Hospital Stay (HOSPITAL_COMMUNITY): Payer: Medicare Other

## 2013-08-17 LAB — CBC WITH DIFFERENTIAL/PLATELET
Basophils Absolute: 0 10*3/uL (ref 0.0–0.1)
Basophils Relative: 0 % (ref 0–1)
Eosinophils Relative: 4 % (ref 0–5)
HCT: 36.7 % — ABNORMAL LOW (ref 39.0–52.0)
MCHC: 33 g/dL (ref 30.0–36.0)
MCV: 97.1 fL (ref 78.0–100.0)
Monocytes Absolute: 0.9 10*3/uL (ref 0.1–1.0)
Neutro Abs: 9.4 10*3/uL — ABNORMAL HIGH (ref 1.7–7.7)
Neutrophils Relative %: 76 % (ref 43–77)
RDW: 15.1 % (ref 11.5–15.5)

## 2013-08-17 LAB — GLUCOSE, CAPILLARY
Glucose-Capillary: 143 mg/dL — ABNORMAL HIGH (ref 70–99)
Glucose-Capillary: 191 mg/dL — ABNORMAL HIGH (ref 70–99)
Glucose-Capillary: 141 mg/dL — ABNORMAL HIGH (ref 70–99)
Glucose-Capillary: 196 mg/dL — ABNORMAL HIGH (ref 70–99)
Glucose-Capillary: 169 mg/dL — ABNORMAL HIGH (ref 70–99)

## 2013-08-17 LAB — BASIC METABOLIC PANEL
BUN: 30 mg/dL — ABNORMAL HIGH (ref 6–23)
CO2: 26 mEq/L (ref 19–32)
Chloride: 99 mEq/L (ref 96–112)
Creatinine, Ser: 1.48 mg/dL — ABNORMAL HIGH (ref 0.50–1.35)
GFR calc Af Amer: 58 mL/min — ABNORMAL LOW (ref >90)
GFR calc non Af Amer: 50 mL/min — ABNORMAL LOW (ref >90)

## 2013-08-17 LAB — URINE CULTURE: Culture: NO GROWTH

## 2013-08-17 MED ORDER — ENOXAPARIN SODIUM 80 MG/0.8ML ~~LOC~~ SOLN
80.0000 mg | SUBCUTANEOUS | Status: DC
  Administered 2013-08-17 – 2013-08-19 (×2): 80 mg via SUBCUTANEOUS
  Filled 2013-08-17 (×3): qty 0.8

## 2013-08-17 MED ORDER — FUROSEMIDE 10 MG/ML IJ SOLN
40.0000 mg | Freq: Two times a day (BID) | INTRAMUSCULAR | Status: AC
  Administered 2013-08-17 – 2013-08-18 (×4): 40 mg via INTRAVENOUS
  Filled 2013-08-17 (×4): qty 4

## 2013-08-17 MED ORDER — INSULIN ASPART 100 UNIT/ML ~~LOC~~ SOLN: 0.0000 [IU] | Freq: Three times a day (TID) | SUBCUTANEOUS | Status: DC

## 2013-08-17 NOTE — Progress Notes (Signed)
Pt recd from ICU, no change in AM assessment.  Abd wound dripping from bandage, ABD pads changed and retaped.  Pain med given for tender abd.  Pt pleasant

## 2013-08-17 NOTE — Progress Notes (Signed)
[facility]ious [facility]    Date of Admission:  08/11/2013   Total days of antibiotics 4        Day 4 piptazo        Day 3 vanco           ID: Kurt Cross is a 62 y.o. male  with a remote history of bowel perforation requiring exploratory laparotomy and temporary colostomy. He then developed a ventral hernia and underwent mesh repair decades ago. He was recently hospitalized and treated for possible pneumonia. He had a fall at home recently and injured his right lower leg leading to readmission. He was noted to be febrile with some abdominal wall swelling. CT scan revealed a large abdominal wall abscess. POD#2 from drainage of 3 L of pus and removal of his old Gore-Tex mesh.  Principal Problem:   Infected prosthetic mesh of abdominal wall with GIANT abscess s/p removal 08/14/2013 Active Problems:   Diabetes mellitus, insulin dependent (IDDM), uncontrolled   HYPERLIPIDEMIA   Obesity, Class III, BMI 40-49.9 (morbid obesity)   OBSTRUCTIVE SLEEP APNEA   Hypertension   Acute-on-chronic respiratory failure secondary to probable community-acquired pneumonia   Fever   Sepsis   Constipation, chronic   Chronic combined systolic and diastolic CHF (congestive heart failure)   Right leg pain   Leukocytosis   AAA (abdominal aortic aneurysm)/ 4.5 cm ascending per CT angio 08/01/13   Recurrent ventral incisional hernia   Acute renal failure (ARF)    Subjective: Afebrile but remains in flutter  Medications:  . acetaminophen  1,000 mg Oral TID  . atorvastatin  40 mg Oral QODAY  . enoxaparin (LOVENOX) injection  80 mg Subcutaneous Q24H  . furosemide  40 mg Intravenous Q12H  . insulin aspart  0-20 Units Subcutaneous Q4H  . insulin glargine  65 Units Subcutaneous QHS  . lip balm  1 application Topical BID  . piperacillin-tazobactam (ZOSYN)  IV  3.375 g Intravenous Q8H  . psyllium  1 packet Oral Daily  . saccharomyces boulardii  250 mg Oral BID  . sodium chloride  3 mL  Intravenous Q12H  . vancomycin  2,000 mg Intravenous Q24H    Objective: Vital signs in last 24 hours: Temp:  [97.3 F (36.3 C)-98.6 F (37 C)] 98.1 F (36.7 C) (11/11 1605) Pulse Rate:  [58-62] 62 (11/11 1605) Resp:  [14-29] 22 (11/11 1605) BP: (114-159)/(64-111) 139/65 mmHg (11/11 1605) SpO2:  [93 %-100 %] 100 % (11/11 1605)  General: Alert and comfortable talking on the telephone  Skin: No rash, splitter or conjunctival hemorrhages  Lungs: Clear  Cor: Regular S1 and S2 with no murmurs  Abdomen: Obese and distended. Goals dressing in place   Lab Results  Recent Labs  08/16/13 0342 08/17/13 0325  WBC 15.3* 12.3*  HGB 11.2* 12.1*  HCT 33.5* 36.7*  NA 132* 134*  K 4.1 4.4  CL 97 99  CO2 26 26  BUN 40* 30*  CREATININE 2.03* 1.48*   Liver Panel  Recent Labs  08/15/13 0049  PROT 6.3  ALBUMIN 1.9*  AST 53*  ALT 27  ALKPHOS 40  BILITOT 1.1    Microbiology: 11/9 wound cx: NGTD, gram stain GPC in prs and clusters 11/9 blood cx: NGTD  Studies/Results: Dg Chest Port 1 View  08/17/2013   CLINICAL DATA:  Respiratory failure  EXAM: PORTABLE CHEST - 1 VIEW  COMPARISON:  08/15/2013.  FINDINGS: Persistent cardiomegaly with pulmonary vascular prominence and interstitial prominence suggesting  congestive heart failure with interstitial edema noted. Interstitial pneumonitis cannot be excluded. Cardiac pacer. Lead tips in stable position. No pneumothorax. No acute osseous abnormality.  IMPRESSION: Stable chest with changes of congestive heart failure with pulmonary interstitial edema. Cardiac pacer in stable position.   Electronically Signed   By: Marcello Moores  Register   On: 08/17/2013 07:41     Assessment/Plan: Abdominal abscess involving abdominal mesh since removed.  - continue with broad spectrum antibiotics. Cultures remain negative. Will need to continue with empiric treatment for at least 2 wks.  Baxter Flattery [facility] for Infectious Diseases Cell:  [phone] Pager: [phone]  08/17/2013, 5:10 PM

## 2013-08-17 NOTE — Progress Notes (Signed)
Subjective:  Doing well denies any chest pain or shortness of breath. Remains in atrial flutter with controlled ventricular response. Positive flatus no BM yet. White count and renal function improved Objective:  Vital Signs in the last 24 hours: Temp:  [96.8 F (36 C)-98.2 F (36.8 C)] 97.9 F (36.6 C) (11/11 0600) Pulse Rate:  [57-61] 59 (11/11 0600) Resp:  [12-25] 15 (11/11 0600) BP: (98-151)/(18-90) 139/83 mmHg (11/11 0600) SpO2:  [93 %-100 %] 98 % (11/11 0600)  Intake/Output from previous day: 11/10 0701 - 11/11 0700 In: 1044.9 [I.V.:419.9; IV Piggyback:625] Out: 1100 [Urine:1100] Intake/Output from this shift: Total I/O In: 3 [I.V.:3] Out: -   Physical Exam: Neck: no adenopathy, no carotid bruit, no JVD and supple, symmetrical, trachea midline Lungs: Decreased breath sound at bases with faint rales Heart: Paced rhythm on the monitor S1 and S2 soft Abdomen: Abdominal binder noted Extremities: extremities normal, atraumatic, no cyanosis or edema  Lab Results:  Recent Labs  08/16/13 0342 08/17/13 0325  WBC 15.3* 12.3*  HGB 11.2* 12.1*  PLT 312 366    Recent Labs  08/16/13 0342 08/17/13 0325  NA 132* 134*  K 4.1 4.4  CL 97 99  CO2 26 26  GLUCOSE 214* 202*  BUN 40* 30*  CREATININE 2.03* 1.48*    Recent Labs  08/15/13 0049  TROPONINI <0.30   Hepatic Function Panel  Recent Labs  08/15/13 0049  PROT 6.3  ALBUMIN 1.9*  AST 53*  ALT 27  ALKPHOS 40  BILITOT 1.1   No results found for this basename: CHOL,  in the last 72 hours No results found for this basename: PROTIME,  in the last 72 hours  Imaging: Imaging results have been reviewed and Dg Chest Port 1 View  08/17/2013   CLINICAL DATA:  Respiratory failure  EXAM: PORTABLE CHEST - 1 VIEW  COMPARISON:  08/15/2013.  FINDINGS: Persistent cardiomegaly with pulmonary vascular prominence and interstitial prominence suggesting congestive heart failure with interstitial edema noted. Interstitial  pneumonitis cannot be excluded. Cardiac pacer. Lead tips in stable position. No pneumothorax. No acute osseous abnormality.  IMPRESSION: Stable chest with changes of congestive heart failure with pulmonary interstitial edema. Cardiac pacer in stable position.   Electronically Signed   By: Marcello Moores  Register   On: 08/17/2013 07:41    Cardiac Studies:  Assessment/Plan:  Status post abdominal abscess drainage and removal of mesh doing well Status post hypotensive shock  Mild volume overload  Acute on chronic kidney disease secondary to hypotension  chronic atrial flutter with controlled ventricular response  Mild coronary artery disease  Mild nonischemic dilated cardiomyopathy  Compensated systolic heart failure  Hypertension  Insulin requiring diabetes mellitus  Morbid obesity  Hypercholesteremia  Obstructive sleep apnea/obesity hypoventilation syndrome  Status post fall  Plan Continue present management Restart alpha as if okay with surgery I will sign off. Please call if needed Followup with me as outpatient in a few weeks once stable from surgical point of view  LOS: 6 days    Ciria Bernardini N 08/17/2013, 9:32 AM

## 2013-08-17 NOTE — Progress Notes (Signed)
[facility] PROGRESS NOTE  Kurt Cross [id number] DOB: 07-05-1951 DOA: 08/11/2013 PCP: [name], MD  Assessment/Plan: #1 infected prosthetic Gore-Tex mesh with a giant abdominal abscess status post IandD and Gore-Tex mesh removal 08/14/2013 Patient is status post incision and drainage of abscess, Gore-Tex mesh removal, debridement of abdominal wall and peritoneum 08/14/2013. Cultures prelim with GPC in pairs and clusters. Patient on empiric IV Vancomycin and Zosyn. Blood cultures pending. Continue wound care as directed by general surgery. May need a wound VAC. Continue abdominal binder. Per general surgery. ID ff.  #2 sepsis Likely secondary to problem #1. Wound cultures prelim with GPC in pairs and clusters. Blood cultures are pending. Urinalysis was negative. Chest x-Imes was negative. On empiric IV Zosyn and IV Vancomycin. ID ff.   #3 hypotension Likely secondary to problem #2. Blood pressure responded to IV fluids. Wound cultures prelim with GPC in pairs and clusters. Blood cultures are pending. Urinalysis is negative. Chest x-Zackery was negative. Continue empiric IV Zosyn and IV Vancomycin. Fluid boluses as needed.  #4 atrial fibrillation/atrial flutter Likely secondary to problem #1. Patient is currently rate controlled. EKG showed flutter waves. Cardiac enzymes have been negative x3. 2-D echo with EF of 50-55%. Wall motion abnormalities cannot be excluded. Patient CHADS 2 score is about 3. Continue Coreg for rate control. Eliquis was discontinued as patient went for emergent surgery secondary to problem #1. General surgery to advise on anticoagulation, eliquis, may be resumed. Patient was to be scheduled for TEE cardioversion today however in light of problem #1 this may be postponed. Will defer to cardiology. Cardiology is following and appreciate input and recommendations.  F/u with cardiology 2-3 weeks post discharge.  #5 right lower extremity pain Secondary to fall. Pain is  more in the posterior thigh/hamstrings and posterior Likely musculoskeletal in nature. Patient with clinical improvement. Continue warm compresses. PT/OT. Follow  #6 leukocytosis/fever Likely secondary to problem #1. Currently afebrile. White count is slowly trending down. Chest x-Werth is negative for infiltrate. Patient with no respiratory symptoms. Urinalysis is negative. Blood cultures are pending. Wound cultures prelim with mod GPC in pairs and clusters. Continue IV vancomycin and IV Zosyn. ID ff.   #7 chronic combined systolic and diastolic CHF  CXR with volume overload. Will give 4 doses of lasix 40mg  IV, then back to home dose. Stable. Continue Coreg.  #8 chronic kidney disease stage III Creatinine is trending back down. ACE inhibitor and Lasix have been discontinued. Will give few doses of IV lasix secondary to volume overload per CXR. Follow.  #9 hyperlipidemia Continue statin.  #10   4.5 cm ascending aortic aneurysm per CT scan of 08/01/2013 Stable. Asymptomatic. Continue risk factor modification with good blood pressure control. Will need outpatient followup with vascular surgery as outpatient.  #11  hypotension Patient noted to be hypotensive postoperatively. BP improved and stable. NSL IVF.  Fluid boluses PRN.   Neosynephrine off.  #12  type 2 diabetes Hemoglobin A1c was 9.5 on 08/02/2013. CBGs have ranged from 143-196. Continue Lantus   65 units daily. Sliding scale insulin.  #13 Prophylaxis PPI for GI. Lovenox for DVT prophylaxis.   Code Status: Full Family Communication: Updated patient at bedside. Disposition Plan: Transfer to telemetry, SNF when medically stable.   Consultants:  Cardiology: Dr. Terrence Dupont 08/13/2013  General surgery: Dr. Johney Maine 08/14/2013  ID: Dr Megan Salon 08/15/13  Procedures:  2-D echo  08/12/2013  X-Kassim of the right hip 08/11/2013  CT abdomen and pelvis pending  Incision and drainage of  abscess, Gore-Tex mesh removal, debridement of  abdominal wall and peritoneum  Antibiotics:  IV Zosyn 08/14/2013  IV Vancomycin 08/15/13  HPI/Subjective: Patient states leg pain improved. Patient asymptomatic denies any chest pain. No shortness of breath. Patient states he's feeling better. Patient feeling confused. Neosynnephrine discontinued.  Objective: Filed Vitals:   08/17/13 0600  BP: 139/83  Pulse: 59  Temp: 97.9 F (36.6 C)  Resp: 15    Intake/Output Summary (Last 24 hours) at 08/17/13 0924 Last data filed at 08/17/13 0911  Gross per 24 hour  Intake    306 ml  Output   1000 ml  Net   -694 ml   Filed Weights   08/13/13 0657 08/14/13 0540 08/16/13 0500  Weight: 156 kg (343 lb 14.7 oz) 156.5 kg (345 lb 0.3 oz) 160.4 kg (353 lb 9.9 oz)    Exam:   General:  NAD  Cardiovascular: irreg irreg  Respiratory: Bibasilar crackles.  Abdomen: Soft/NT/+BS/ Bandage on abdomen  Musculoskeletal: No c/c/e  Data Reviewed: Basic Metabolic Panel:  Recent Labs Lab 08/14/13 0355 08/15/13 0049 08/15/13 0346 08/16/13 0342 08/17/13 0325  NA 130* 130* 130* 132* 134*  K 4.0 3.5 3.7 4.1 4.4  CL 91* 93* 94* 97 99  CO2 26 25 25 26 26   GLUCOSE 162* 171* 207* 214* 202*  BUN 32* 36* 36* 40* 30*  CREATININE 1.94* 1.93* 1.98* 2.03* 1.48*  CALCIUM 9.8 8.7 8.5 8.9 9.1  MG  --   --   --  1.9  --    Liver Function Tests:  Recent Labs Lab 08/13/13 2007 08/15/13 0049  AST 80* 53*  ALT 37 27  ALKPHOS 53 40  BILITOT 0.9 1.1  PROT 7.1 6.3  ALBUMIN 2.2* 1.9*   No results found for this basename: LIPASE, AMYLASE,  in the last 168 hours No results found for this basename: AMMONIA,  in the last 168 hours CBC:  Recent Labs Lab 08/13/13 0512 08/14/13 0355 08/15/13 0049 08/15/13 0346 08/16/13 0342 08/17/13 0325  WBC 18.4* 19.6* 18.9* 17.9* 15.3* 12.3*  NEUTROABS 14.8*  --   --   --  11.9* 9.4*  HGB 13.3 13.1 11.7* 11.6* 11.2* 12.1*  HCT 39.1 37.9* 34.3* 34.2* 33.5* 36.7*  MCV 95.6 95.7 94.8 94.2 96.5 97.1  PLT 363  325 324 308 312 366   Cardiac Enzymes:  Recent Labs Lab 08/11/13 1954 08/12/13 0050 08/12/13 0655 08/15/13 0049  TROPONINI <0.30 <0.30 <0.30 <0.30   BNP (last 3 results)  Recent Labs  08/24/12 1050 08/27/12 0500 08/01/13 1649  PROBNP 1036.0* 253.4* 481.4*   CBG:  Recent Labs Lab 08/16/13 1213 08/16/13 1637 08/16/13 1946 08/17/13 0312 08/17/13 0807  GLUCAP 162* 200* 180* 196* 143*    Recent Results (from the past 240 hour(s))  URINE CULTURE     Status: None   Collection Time    08/11/13  6:24 PM      Result Value Range Status   Specimen Description URINE, CLEAN CATCH   Final   Special Requests NONE   Final   Culture  Setup Time     Final   Value: 08/12/2013 02:25     Performed at [facility] Count     Final   Value: NO GROWTH     Performed at [facility]   Culture     Final   Value: NO GROWTH     Performed at [facility]   Report Status 08/13/2013 FINAL  Final  CULTURE, BLOOD (ROUTINE X 2)     Status: None   Collection Time    08/13/13  2:55 PM      Result Value Range Status   Specimen Description BLOOD LEFT HAND   Final   Special Requests BOTTLES DRAWN AEROBIC ONLY 10CC   Final   Culture  Setup Time     Final   Value: 08/13/2013 22:30     Performed at [facility]   Culture     Final   Value:        BLOOD CULTURE RECEIVED NO GROWTH TO DATE CULTURE WILL BE HELD FOR 5 DAYS BEFORE ISSUING A FINAL NEGATIVE REPORT     Performed at [facility]   Report Status PENDING   Incomplete  CULTURE, BLOOD (ROUTINE X 2)     Status: None   Collection Time    08/13/13  3:00 PM      Result Value Range Status   Specimen Description BLOOD LEFT ARM   Final   Special Requests BOTTLES DRAWN AEROBIC AND ANAEROBIC 10CC   Final   Culture  Setup Time     Final   Value: 08/13/2013 22:31     Performed at [facility]   Culture     Final   Value:        BLOOD CULTURE RECEIVED NO GROWTH TO DATE CULTURE WILL BE HELD  FOR 5 DAYS BEFORE ISSUING A FINAL NEGATIVE REPORT     Performed at [facility]   Report Status PENDING   Incomplete  URINE CULTURE     Status: None   Collection Time    08/13/13  3:11 PM      Result Value Range Status   Specimen Description URINE, CLEAN CATCH   Final   Special Requests NONE   Final   Culture  Setup Time     Final   Value: 08/14/2013 06:01     Performed at [facility] Count     Final   Value: NO GROWTH     Performed at [facility]   Culture     Final   Value: NO GROWTH     Performed at [facility]   Report Status 08/15/2013 FINAL   Final  SURGICAL PCR SCREEN     Status: None   Collection Time    08/14/13  4:35 PM      Result Value Range Status   MRSA, PCR NEGATIVE  NEGATIVE Final   Staphylococcus aureus NEGATIVE  NEGATIVE Final   Comment:            The Xpert SA Assay (FDA     approved for NASAL specimens     in patients over 26 years of age),     is one component of     a comprehensive surveillance     program.  Test performance has     been validated by [employer] for patients greater     than or equal to 74 year old.     It is not intended     to diagnose infection nor to     guide or monitor treatment.  CULTURE, ROUTINE-ABSCESS     Status: None   Collection Time    08/14/13  6:28 PM      Result Value Range Status   Specimen Description ABDOMEN INCISION DRAINAGE   Final   Special Requests  NONE   Final   Gram Stain     Final   Value: ABUNDANT WBC PRESENT, PREDOMINANTLY PMN     NO SQUAMOUS EPITHELIAL CELLS SEEN     MODERATE GRAM POSITIVE COCCI IN PAIRS     IN CLUSTERS     Performed at [facility]   Culture     Final   Value: NO GROWTH 2 DAYS     Performed at [facility]   Report Status PENDING   Incomplete  ANAEROBIC CULTURE     Status: None   Collection Time    08/14/13  6:28 PM      Result Value Range Status   Specimen Description ABDOMEN INCISION DRAINAGE   Final    Special Requests NONE   Final   Gram Stain PENDING   Incomplete   Culture     Final   Value: NO ANAEROBES ISOLATED; CULTURE IN PROGRESS FOR 5 DAYS     Performed at [facility]   Report Status PENDING   Incomplete  CULTURE, BLOOD (ROUTINE X 2)     Status: None   Collection Time    08/15/13 12:40 AM      Result Value Range Status   Specimen Description BLOOD LEFT ARM   Final   Special Requests BOTTLES DRAWN AEROBIC AND ANAEROBIC 5CC   Final   Culture  Setup Time     Final   Value: 08/15/2013 13:18     Performed at [facility]   Culture     Final   Value:        BLOOD CULTURE RECEIVED NO GROWTH TO DATE CULTURE WILL BE HELD FOR 5 DAYS BEFORE ISSUING A FINAL NEGATIVE REPORT     Performed at [facility]   Report Status PENDING   Incomplete  CULTURE, BLOOD (ROUTINE X 2)     Status: None   Collection Time    08/15/13 12:50 AM      Result Value Range Status   Specimen Description BLOOD LEFT ARM   Final   Special Requests BOTTLES DRAWN AEROBIC AND ANAEROBIC 5CC   Final   Culture  Setup Time     Final   Value: 08/15/2013 13:18     Performed at [facility]   Culture     Final   Value:        BLOOD CULTURE RECEIVED NO GROWTH TO DATE CULTURE WILL BE HELD FOR 5 DAYS BEFORE ISSUING A FINAL NEGATIVE REPORT     Performed at [facility]   Report Status PENDING   Incomplete  URINE CULTURE     Status: None   Collection Time    08/15/13  2:37 AM      Result Value Range Status   Specimen Description URINE, CATHETERIZED   Final   Special Requests NONE   Final   Culture  Setup Time     Final   Value: 08/15/2013 16:08     Performed at [place] PENDING   Incomplete   Culture     Final   Value: NO GROWTH     Performed at [facility]   Report Status PENDING   Incomplete  CLOSTRIDIUM DIFFICILE BY PCR     Status: None   Collection Time    08/15/13  9:04 AM      Result Value Range Status   C difficile by pcr NEGATIVE   NEGATIVE Final  Comment: Performed at [facility]     Studies: Dg Chest Port 1 View  08/17/2013   CLINICAL DATA:  Respiratory failure  EXAM: PORTABLE CHEST - 1 VIEW  COMPARISON:  08/15/2013.  FINDINGS: Persistent cardiomegaly with pulmonary vascular prominence and interstitial prominence suggesting congestive heart failure with interstitial edema noted. Interstitial pneumonitis cannot be excluded. Cardiac pacer. Lead tips in stable position. No pneumothorax. No acute osseous abnormality.  IMPRESSION: Stable chest with changes of congestive heart failure with pulmonary interstitial edema. Cardiac pacer in stable position.   Electronically Signed   By: Marcello Moores  Register   On: 08/17/2013 07:41    Scheduled Meds: . acetaminophen  1,000 mg Oral TID  . atorvastatin  40 mg Oral QODAY  . insulin aspart  0-20 Units Subcutaneous Q4H  . insulin glargine  65 Units Subcutaneous QHS  . lip balm  1 application Topical BID  . piperacillin-tazobactam (ZOSYN)  IV  3.375 g Intravenous Q8H  . psyllium  1 packet Oral Daily  . saccharomyces boulardii  250 mg Oral BID  . sodium chloride  3 mL Intravenous Q12H  . vancomycin  2,000 mg Intravenous Q24H   Continuous Infusions: . norepinephrine (LEVOPHED) Adult infusion Stopped (08/16/13 0900)    Principal Problem:   Infected prosthetic mesh of abdominal wall with GIANT abscess s/p removal 08/14/2013 Active Problems:   Diabetes mellitus, insulin dependent (IDDM), uncontrolled   HYPERLIPIDEMIA   Obesity, Class III, BMI 40-49.9 (morbid obesity)   OBSTRUCTIVE SLEEP APNEA   Hypertension   Acute-on-chronic respiratory failure secondary to probable community-acquired pneumonia   Fever   Sepsis   Constipation, chronic   Chronic combined systolic and diastolic CHF (congestive heart failure)   Right leg pain   Leukocytosis   AAA (abdominal aortic aneurysm)/ 4.5 cm ascending per CT angio 08/01/13   Recurrent ventral incisional hernia   Acute renal  failure (ARF)    Time spent: 35 mins    [facility] Pager [phone]. If 7PM-7AM, please contact night-coverage at [email or web address], password [facility] 08/17/2013, 9:24 AM  LOS: 6 days

## 2013-08-17 NOTE — Progress Notes (Signed)
3 Days Post-Op  Subjective: Pt doing much better today, still sore.  Tolerating HH diet, no N/V.  Having flatus, last BM on 08/15/13.  Mobilized yesterday to chair with PT/OT.  Happy about his progress.  He has his abdominal binder now.  Objective: Vital signs in last 24 hours: Temp:  [96.8 F (36 C)-98.2 F (36.8 C)] 97.9 F (36.6 C) (11/11 0600) Pulse Rate:  [57-61] 59 (11/11 0600) Resp:  [12-25] 15 (11/11 0600) BP: (87-151)/(18-90) 139/83 mmHg (11/11 0600) SpO2:  [93 %-100 %] 98 % (11/11 0600) Last BM Date: 08/15/13  Intake/Output from previous day: 11/10 0701 - 11/11 0700 In: 1044.9 [I.V.:419.9; IV Piggyback:625] Out: 1100 [Urine:1100] Intake/Output this shift:    PE: Gen:  Alert, NAD, pleasant Abd: Largely obese, soft, mild tenderness, +distension, +BS, no HSM, left dressing in place since changed this am, packing not significantly saturated, not significantly tender   Lab Results:   Recent Labs  08/16/13 0342 08/17/13 0325  WBC 15.3* 12.3*  HGB 11.2* 12.1*  HCT 33.5* 36.7*  PLT 312 366   BMET  Recent Labs  08/16/13 0342 08/17/13 0325  NA 132* 134*  K 4.1 4.4  CL 97 99  CO2 26 26  GLUCOSE 214* 202*  BUN 40* 30*  CREATININE 2.03* 1.48*  CALCIUM 8.9 9.1   PT/INR  Recent Labs  08/15/13 0049  LABPROT 17.5*  INR 1.48   CMP     Component Value Date/Time   NA 134* 08/17/2013 0325   K 4.4 08/17/2013 0325   CL 99 08/17/2013 0325   CO2 26 08/17/2013 0325   GLUCOSE 202* 08/17/2013 0325   BUN 30* 08/17/2013 0325   CREATININE 1.48* 08/17/2013 0325   CALCIUM 9.1 08/17/2013 0325   PROT 6.3 08/15/2013 0049   ALBUMIN 1.9* 08/15/2013 0049   AST 53* 08/15/2013 0049   ALT 27 08/15/2013 0049   ALKPHOS 40 08/15/2013 0049   BILITOT 1.1 08/15/2013 0049   GFRNONAA 50* 08/17/2013 0325   GFRAA 58* 08/17/2013 0325   Lipase  No results found for this basename: lipase       Studies/Results: No results found.  Anti-infectives: Anti-infectives   Start      Dose/Rate Route Frequency Ordered Stop   08/16/13 0800  vancomycin (VANCOCIN) 2,000 mg in sodium chloride 0.9 % 500 mL IVPB     2,000 mg 250 mL/hr over 120 Minutes Intravenous Every 24 hours 08/15/13 1411     08/15/13 1500  vancomycin (VANCOCIN) 2,000 mg in sodium chloride 0.9 % 500 mL IVPB     2,000 mg 250 mL/hr over 120 Minutes Intravenous  Once 08/15/13 1411 08/15/13 1713   08/14/13 1600  piperacillin-tazobactam (ZOSYN) IVPB 3.375 g     3.375 g 12.5 mL/hr over 240 Minutes Intravenous Every 8 hours 08/14/13 1543         Assessment/Plan Giant abdominal abscess with infected old GoreTex mesh POD #3 s/p I&D abscess, removal of mesh, debridement of abdominal wall & peritoneum  Sepsis 2* above - resolving, WBC trending down at 12.3 Acute on chronic kidney disease Stage III - Cr. down to 1.48 H/o OSA, DM, HTN, HLD, CHF, AAA  Plan:  1. Continue BID packing of wound, at some point can switch to wound vac, will need white/bowel protective sponge with SB exposed, abdominal binder (not ready for wound vac yet) 2. Cont IV abx (Vancomycin and Zosyn), pending cultures, gram stain gram + cocci in pairs, defer length of treatment and mode to ID  3. Mobilize to help with recovery & IS  4. DVT proph - SCD's and not currently on Heparin or Lovenox, okay to start prophylaxis, Dr. Redmond Pulling recommends holding off on [place] for now, re-eval tomorrow 5. D/c foley, can likely reduce IVF - leave up to primary service 6. Dressing changed at 0500, will change again between 3-4pm  7. Tolerating HH diet 8. Okay to transfer to floor from surgical stand point, when medically stable 9. Hopefully home soon given normalizing WBC, tolerating dressing changes well, and tolerating HH diet 10. Would need HH if going home vs SNF depending on progress     LOS: 6 days    DORT, Baneza Bartoszek 08/17/2013, 7:42 AM Pager: [phone]

## 2013-08-17 NOTE — Progress Notes (Signed)
ANTICOAGULATION CONSULT NOTE - Initial Consult  Pharmacy Consult for Lovenox Indication: VTE prophylaxis  No Known Allergies  Patient Measurements: Height: 6' (182.9 cm) Weight: 353 lb 9.9 oz (160.4 kg) (weighed after dressing change) IBW/kg (Calculated) : 77.6  Labs:  Recent Labs  08/15/13 0049 08/15/13 0346 08/16/13 0342 08/17/13 0325  HGB 11.7* 11.6* 11.2* 12.1*  HCT 34.3* 34.2* 33.5* 36.7*  PLT 324 308 312 366  APTT 43*  --   --   --   LABPROT 17.5*  --   --   --   INR 1.48  --   --   --   CREATININE 1.93* 1.98* 2.03* 1.48*  TROPONINI <0.30  --   --   --     Estimated Creatinine Clearance: 83.1 ml/min (by C-G formula based on Cr of 1.48).   Assessment: 41 yoM admitted with large abdominal wall abscess s/p drainage with mesh removal 11/8.  New to this admission, patient has been in atrial flutter with controlled ventricular response.  Cardiology following and recommended apixaban for patient with CHADS2 score of 3.  This was started 11/7 and then held for emergent surgery 11/8.  Surgery recommends to continue holding apixaban for now but ok to start Lovenox for VTE prophylaxis.  Starting Lovenox for VTE prophylaxis today, POD#3.  Weight 160 kg, BMI = 47  SCr 1.48, CrCl~83 ml/min using adjusted body weight  Goal of Therapy:  VTE prophylaxis Monitor platelets by anticoagulation protocol: Yes   Plan:  Start Lovenox 80 mg (0.5 mg/kg) SQ q24h for VTE prophylaxis for BMI>30 and CrCl>30 ml/min.  F/u plan to resume apixaban.  Hershal Coria 08/17/2013,10:49 AM

## 2013-08-18 ENCOUNTER — Inpatient Hospital Stay (HOSPITAL_COMMUNITY): Payer: Medicare Other

## 2013-08-18 DIAGNOSIS — K632 Fistula of intestine: Secondary | ICD-10-CM

## 2013-08-18 DIAGNOSIS — Z5189 Encounter for other specified aftercare: Secondary | ICD-10-CM

## 2013-08-18 DIAGNOSIS — I714 Abdominal aortic aneurysm, without rupture: Secondary | ICD-10-CM

## 2013-08-18 LAB — BASIC METABOLIC PANEL
BUN: 18 mg/dL (ref 6–23)
Calcium: 9 mg/dL (ref 8.4–10.5)
Chloride: 101 mEq/L (ref 96–112)
Creatinine, Ser: 1.04 mg/dL (ref 0.50–1.35)
GFR calc Af Amer: 88 mL/min — ABNORMAL LOW (ref >90)
GFR calc non Af Amer: 76 mL/min — ABNORMAL LOW (ref >90)
Glucose, Bld: 116 mg/dL — ABNORMAL HIGH (ref 70–99)
Potassium: 3.9 mEq/L (ref 3.5–5.1)
Sodium: 136 mEq/L (ref 135–145)

## 2013-08-18 LAB — GLUCOSE, CAPILLARY
Glucose-Capillary: 104 mg/dL — ABNORMAL HIGH (ref 70–99)
Glucose-Capillary: 86 mg/dL (ref 70–99)

## 2013-08-18 LAB — CBC WITH DIFFERENTIAL/PLATELET
Basophils Relative: 0 % (ref 0–1)
Eosinophils Absolute: 0.4 10*3/uL (ref 0.0–0.7)
Eosinophils Relative: 4 % (ref 0–5)
Hemoglobin: 12.4 g/dL — ABNORMAL LOW (ref 13.0–17.0)
Lymphs Abs: 1.7 10*3/uL (ref 0.7–4.0)
MCH: 32.1 pg (ref 26.0–34.0)
Monocytes Absolute: 0.8 10*3/uL (ref 0.1–1.0)
Monocytes Relative: 7 % (ref 3–12)
Neutro Abs: 8.5 10*3/uL — ABNORMAL HIGH (ref 1.7–7.7)
Neutrophils Relative %: 74 % (ref 43–77)
Platelets: 402 10*3/uL — ABNORMAL HIGH (ref 150–400)
RBC: 3.86 MIL/uL — ABNORMAL LOW (ref 4.22–5.81)
RDW: 14.7 % (ref 11.5–15.5)

## 2013-08-18 MED ORDER — SODIUM CHLORIDE 0.9 % IJ SOLN
10.0000 mL | INTRAMUSCULAR | Status: DC | PRN
  Administered 2013-08-19 – 2013-09-27 (×43): 10 mL
  Administered 2013-09-28 (×2): 20 mL
  Administered 2013-09-29 – 2013-09-30 (×4): 10 mL
  Administered 2013-09-30: 20 mL
  Administered 2013-10-01 – 2013-10-03 (×4): 10 mL
  Administered 2013-10-03: 20 mL
  Administered 2013-10-04 – 2013-10-05 (×3): 10 mL
  Administered 2013-10-06 – 2013-10-07 (×2): 20 mL
  Administered 2013-10-07 – 2013-10-15 (×16): 10 mL

## 2013-08-18 MED ORDER — SODIUM CHLORIDE 0.9 % IV SOLN
INTRAVENOUS | Status: DC
  Administered 2013-08-19: 10 mL/h via INTRAVENOUS
  Administered 2013-08-24 (×2): via INTRAVENOUS
  Administered 2013-08-24: 10:00:00 50 mL/h via INTRAVENOUS
  Administered 2013-08-25 – 2013-09-01 (×7): via INTRAVENOUS

## 2013-08-18 MED ORDER — INSULIN ASPART 100 UNIT/ML ~~LOC~~ SOLN
0.0000 [IU] | SUBCUTANEOUS | Status: DC
  Administered 2013-08-19: 13:00:00 2 [IU] via SUBCUTANEOUS
  Administered 2013-08-19 – 2013-08-20 (×5): 3 [IU] via SUBCUTANEOUS
  Administered 2013-08-21 – 2013-08-22 (×5): 4 [IU] via SUBCUTANEOUS
  Administered 2013-08-22: 7 [IU] via SUBCUTANEOUS
  Administered 2013-08-22: 4 [IU] via SUBCUTANEOUS
  Administered 2013-08-22: 7 [IU] via SUBCUTANEOUS
  Administered 2013-08-22: 13:00:00 4 [IU] via SUBCUTANEOUS
  Administered 2013-08-22: 18:00:00 7 [IU] via SUBCUTANEOUS
  Administered 2013-08-23: 3 [IU] via SUBCUTANEOUS
  Administered 2013-08-23 (×4): 4 [IU] via SUBCUTANEOUS
  Administered 2013-08-23: 16:00:00 via SUBCUTANEOUS
  Administered 2013-08-24 (×2): 4 [IU] via SUBCUTANEOUS
  Administered 2013-08-24: 12:00:00 3 [IU] via SUBCUTANEOUS
  Administered 2013-08-24 (×2): 4 [IU] via SUBCUTANEOUS
  Administered 2013-08-25 – 2013-08-26 (×5): 3 [IU] via SUBCUTANEOUS
  Administered 2013-08-26 (×2): 4 [IU] via SUBCUTANEOUS
  Administered 2013-08-27 (×4): 3 [IU] via SUBCUTANEOUS
  Administered 2013-08-28: 17:00:00 4 [IU] via SUBCUTANEOUS
  Administered 2013-08-28 – 2013-08-31 (×7): 3 [IU] via SUBCUTANEOUS
  Administered 2013-09-01: 4 [IU] via SUBCUTANEOUS
  Administered 2013-09-02 (×4): 3 [IU] via SUBCUTANEOUS
  Administered 2013-09-02: 01:00:00 4 [IU] via SUBCUTANEOUS
  Administered 2013-09-03: 2 [IU] via SUBCUTANEOUS
  Administered 2013-09-03 – 2013-09-04 (×3): 3 [IU] via SUBCUTANEOUS
  Administered 2013-09-04: 22:00:00 7 [IU] via SUBCUTANEOUS
  Administered 2013-09-04 (×2): 3 [IU] via SUBCUTANEOUS
  Administered 2013-09-05 (×2): 4 [IU] via SUBCUTANEOUS
  Administered 2013-09-05: 21:00:00 3 [IU] via SUBCUTANEOUS
  Administered 2013-09-05 – 2013-09-06 (×3): 4 [IU] via SUBCUTANEOUS
  Administered 2013-09-06 – 2013-09-07 (×5): 3 [IU] via SUBCUTANEOUS

## 2013-08-18 MED ORDER — TRACE MINERALS CR-CU-F-FE-I-MN-MO-SE-ZN IV SOLN
INTRAVENOUS | Status: AC
  Administered 2013-08-18: 22:00:00 via INTRAVENOUS
  Filled 2013-08-18: qty 1000

## 2013-08-18 MED ORDER — FAT EMULSION 20 % IV EMUL
250.0000 mL | INTRAVENOUS | Status: AC
  Administered 2013-08-18: 250 mL via INTRAVENOUS
  Filled 2013-08-18: qty 250

## 2013-08-18 MED ORDER — FUROSEMIDE 80 MG PO TABS
80.0000 mg | ORAL_TABLET | Freq: Two times a day (BID) | ORAL | Status: DC
  Administered 2013-08-19 – 2013-08-30 (×23): 80 mg via ORAL
  Filled 2013-08-18 (×27): qty 1

## 2013-08-18 NOTE — Progress Notes (Addendum)
ANTIBIOTIC CONSULT NOTE   Pharmacy Consult for Zosyn/vancomycin Indication: intra-abdominal infection  No Known Allergies  Patient Measurements: Height: 6' (182.9 cm) Weight: 353 lb 9.9 oz (160.4 kg) (weighed after dressing change) IBW/kg (Calculated) : 77.6  Vital Signs: Temp: 97.7 F (36.5 C) (11/12 0600) Temp src: Oral (11/12 0600) BP: 152/82 mmHg (11/12 0600) Pulse Rate: 76 (11/12 0600)  Labs:  Recent Labs  08/16/13 0342 08/17/13 0325 08/18/13 0645  WBC 15.3* 12.3* 11.4*  HGB 11.2* 12.1* 12.4*  PLT 312 366 402*  CREATININE 2.03* 1.48* 1.04   Vancomycin trough 08/18/13:   12.3  Medical History: Past Medical History  Diagnosis Date  . Sleep apnea   . Diabetes mellitus   . Hypertension   . Hyperlipidemia   . CHF (congestive heart failure)   . Hernia   . AAA (abdominal aortic aneurysm)/ 4.5 cm ascending per CT angio 08/01/13 08/12/2013  . Infected prosthetic mesh of abdominal wall with GIANT abscess s/p removal 08/14/2013 08/14/2013   Antibiotics and culture results: 11/8 >> Zosyn >>  11/9 >> vancomycin>> ==================== 11/5 urine >> NGF 11/7 urine >> NGF 11/7 blood x2>> NGTD 11/8: abdomen incision drainage: NGTD 11/9: C. Diff: negative 11/9: blood x 2: pending 11/9: sputum: ordered 11/9: urine: NGF  Goal vancomycin trough per previous notes: 10-39mcg/ml   Assessment: 62 y/o M presented with fluid collection / abscess within anterior abdominal wall.  On 08/14/13 underwent I&D with removal of infected prosthetic Gore-tex mesh and evacuation of 3L purulent fluid.    Now on D#5 Zosyn 3.375 grams IV q8h (extended-infusion, each dose over 4 hours) and D#4 Vancomycin 2 grams IV q24h.  Afebrile, WBC improving  Vancomycin trough 12.3 (acceptable)  Azotemia has resolved   Plan:  1. Continue Vancomycin 2 gm IV q24h 2. Continue Zosyn 3.375 gm IV q8h (extended infusion, each dose over 4 hours)  Cammy Brochure, PharmD Candidate  08/18/2013 10:22  AM    I have reviewed the above note and agree with the findings, assessment, and plans.  Clayburn Pert, PharmD, BCPS Pager: [phone] 08/18/2013  11:16 AM

## 2013-08-18 NOTE — Progress Notes (Addendum)
Patient transferred back to [facility] from ICU. CSW to meet with family today to discuss bed choice.  Kamaron Deskins C. [place] MSW, [place] CSW met with patient. Discussed bed offers. He has not made a decision at this time. CSW discussed new events since he was last faxed out. Patient's FL2 needs to be updated and resent out. CSW did same.  Bryson Gavia C. [place] MSW, [place]

## 2013-08-18 NOTE — Consult Note (Signed)
Wound care follow-up: Requested by CCS team to apply Eakin pouch to full thickness abd wound.  Moist gauze packing left over open abd wound and applied large Eakin pouch.  Pt denies C/O pain at present.  Mod amt thick brown drainage on packing to left lower wound.  Instructions and supplies left at bedside for staff nurses to change pouch PRN if leaking or need to empty pouch occurs. Julien Girt MSN, RN, [place], [place], [place]

## 2013-08-18 NOTE — Progress Notes (Signed)
OT Cancellation Note  Patient Details Name: JANSEL DEARY MRN: [id number] DOB: 01-Apr-1951   Cancelled Treatment:    Reason Eval/Treat Not Completed: Other (comment).  Pt states they are still working on his dressing.  Will return tomorrow.    Shazia Mitchener 08/18/2013, 12:23 PM [name], OTR/L [phone] 08/18/2013

## 2013-08-18 NOTE — Progress Notes (Addendum)
Paged Will [place], PA to assess wound during dressing change.  PA concerned about appearance of exudate, instructed RN not to repack wound as yet, cover it with moist ABD pad until MD can assess wound status.  Wound has been covered with moist ABD.  Measures L 22cm, W 9.5 cm, D 2.5cm, Undermining: 12 o'clock 3cm, 3 o'clock 6.75cm, 6 o'clock 7cm, and 9 o'clock 5cm.  No further orders.

## 2013-08-18 NOTE — Progress Notes (Signed)
PARENTERAL NUTRITION CONSULT NOTE - INITIAL  Pharmacy Consult for TNA Indication: Enterocutaneous fistula  No Known Allergies  Patient Measurements: Height: 6' (182.9 cm) Weight: 353 lb 9.9 oz (160.4 kg) (weighed after dressing change) IBW/kg (Calculated) : 77.6 Adjusted Body Weight: 77.6kg  Vital Signs: Temp: 98.4 F (36.9 C) (11/12 1420) Temp src: Oral (11/12 1420) BP: 154/73 mmHg (11/12 1420) Pulse Rate: 75 (11/12 1420) Intake/Output from previous day: 11/11 0701 - 11/12 0700 In: [id number] [P.O.:740; I.V.:3; IV Piggyback:600] Out: 1225 [Urine:1225] Intake/Output from this shift: Total I/O In: 603 [I.V.:3; IV Piggyback:600] Out: 850 [Urine:850]  Labs:  Recent Labs  08/16/13 0342 08/17/13 0325 08/18/13 0645  WBC 15.3* 12.3* 11.4*  HGB 11.2* 12.1* 12.4*  HCT 33.5* 36.7* 37.3*  PLT 312 366 402*     Recent Labs  08/16/13 0342 08/17/13 0325 08/18/13 0645  NA 132* 134* 136  K 4.1 4.4 3.9  CL 97 99 101  CO2 26 26 28   GLUCOSE 214* 202* 116*  BUN 40* 30* 18  CREATININE 2.03* 1.48* 1.04  CALCIUM 8.9 9.1 9.0  MG 1.9  --   --    Estimated Creatinine Clearance: 118.3 ml/min (by C-G formula based on Cr of 1.04).    Recent Labs  08/18/13 0026 08/18/13 0902 08/18/13 1202  GLUCAP 151* 86 72    Medical History: Past Medical History  Diagnosis Date  . Sleep apnea   . Diabetes mellitus   . Hypertension   . Hyperlipidemia   . CHF (congestive heart failure)   . Hernia   . AAA (abdominal aortic aneurysm)/ 4.5 cm ascending per CT angio 08/01/13 08/12/2013  . Infected prosthetic mesh of abdominal wall with GIANT abscess s/p removal 08/14/2013 08/14/2013    Medications:  Scheduled:  . acetaminophen  1,000 mg Oral TID  . atorvastatin  40 mg Oral QODAY  . enoxaparin (LOVENOX) injection  80 mg Subcutaneous Q24H  . furosemide  40 mg Intravenous Q12H  . insulin aspart  0-20 Units Subcutaneous TID WC  . insulin glargine  65 Units Subcutaneous QHS  . lip balm  1  application Topical BID  . piperacillin-tazobactam (ZOSYN)  IV  3.375 g Intravenous Q8H  . psyllium  1 packet Oral Daily  . saccharomyces boulardii  250 mg Oral BID  . sodium chloride  3 mL Intravenous Q12H  . vancomycin  2,000 mg Intravenous Q24H    Insulin Requirements in the past 24 hours:  Lantus 65 units sq qHS 3 units resistant scale SSI  Current Nutrition:  Made NPO today   Assessment: 62 yo male with large abdominal abscess with infected old Gore Tex mesh s/p I&D abscess, removal of mesh, debridement of abdominal wall and peritoneum. 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 Order to place PICC and begin TNA per pharmacy protocol.  Nutritional Goals:  RD assessment pending. Patient will need high protein to promote wound healing, but may not be able to provide enough with TNA alone without overfeeding.  Plan: if PICC can be placed tonight:  Begin Clinimix E5/15 at 40 ml/hr and 20% Lipids at 50ml/hr - goal rate to be determined pending RD assessment  Change CBG/SSI to q4h  TNA labs in am  Peggyann Juba, PharmD, BCPS Pager: [phone] 08/18/2013,4:39 PM

## 2013-08-18 NOTE — Progress Notes (Addendum)
Patient's wound evaluated by Dr. Redmond Pulling.  Fistula observed by MD and PA.  Fistula is draining moderate to copious amounts of thick, dark brown exudate with no odor.  Area to left of wound is red and warm to touch.  PA at bedside, notified.  [place] RN reconsulted, Eakins pouch placed on wound to collect drainage.  Orders for PICC placed, and patient made NPO.  Second, smaller wound has no drainage at this time.  Lovenox held per Dr. Dois Davenport order. Will continue to monitor patient.

## 2013-08-18 NOTE — Progress Notes (Addendum)
Patient's wife brought in fast food lunch for patient.  Counseled patient and wife about complicated ongoing medical treatment for multiple comorbidities and noncompliance with diet restrictions.  Patient made NPO per Modena Jansky, PA pending further assessment of wound and drainage by surgery MD.  Patient educated regarding wound drainage suspicious for stool and reason for change to NPO.  Wound assessed and redressed by [facility] RN.  Will continue to monitor.

## 2013-08-18 NOTE — Progress Notes (Signed)
[facility] PROGRESS NOTE  Kurt Cross [id number] DOB: [birth year] DOA: 08/11/2013 PCP: Kurt Gravel, MD  Assessment/Plan: #1 infected prosthetic Gore-Tex mesh with a giant abdominal abscess status post IandD and Gore-Tex mesh removal 08/14/2013  Patient is status post incision and drainage of abscess, Gore-Tex mesh removal, debridement of abdominal wall and peritoneum 08/14/2013.  -empiric IV Vancomycin and Zosyn.  Blood cultures--neg  -Continue wound care as directed by general surgery.  -now with EC fistula -PICC and TNA per surgery -abx per ID #2 sepsis  Likely secondary to problem #1.  -Wound cultures prelim with GPC in pairs and clusters-->Coag Neg Staph  #3 hypotension  -resolved -secondary to problem #2.  -Blood pressure responded to IV fluids. #4 atrial fibrillation/atrial flutter  Likely secondary to problem #1.  -currently rate controlled. EKG showed flutter waves.  -Cardiac enzymes have been negative x3. 2-D echo with EF of 50-55%. Wall motion abnormalities cannot be excluded. -CHADS 2 score is about 3.  -Continue Coreg for rate control.  -Eliquis was discontinued as patient went for emergent surgery secondary to problem #1. General surgery to advise on anticoagulation, eliquis, may be resumed.  -scheduled for TEE cardioversion today however in light of problem #1 this may be postponed. Will defer to cardiology. Cardiology is following and appreciate input and recommendations. - F/u with cardiology 2-3 weeks post discharge.  #5 right lower extremity pain  Secondary to fall. Pain is more in the posterior thigh/hamstrings and posterior Likely musculoskeletal in nature. Patient with clinical improvement. Continue warm compresses. PT/OT. Follow  #6 leukocytosis/fever  Likely secondary to problem #1. Currently afebrile. White count is slowly trending down. Chest x-T[place]ge is negative for infiltrate. Patient with no respiratory symptoms.  #7 chronic combined systolic and  diastolic CHF  CXR with volume overload. Will give 4 doses of lasix 40mg  IV, then back to home dose. Stable. Continue Coreg.  -restart home po lasix am 11/13 #8 chronic kidney disease stage III  -Creatinine is trending back down. ACE inhibitor and Lasix had been discontinued.   #9 hyperlipidemia  Continue statin.  #10 4.5 cm ascending aortic aneurysm per CT scan of 08/01/2013  Stable. Asymptomatic. Continue risk factor modification with good blood pressure control. Will need outpatient followup with vascular surgery as outpatient.  #11 hypotension  Patient noted to be hypotensive postoperatively. BP improved and stable. NSL IVF. Fluid boluses PRN. Neosynephrine off.  #12 type 2 diabetes  Hemoglobin A1c was 9.5 on 08/02/2013. CBGs have ranged from 143-196. Continue Lantus 65 units daily. Sliding scale insulin.   Disposition Plan:   LTAC when okay with surgery and ID  Consultants:  Cardiology: Dr. Terrence Dupont 08/13/2013  General surgery: Dr. Johney Maine 08/14/2013  ID: Dr Megan Salon 08/15/13 Procedures:  2-D echo 08/12/2013  X-Gaffey of the right hip 08/11/2013  Incision and drainage of abscess, Gore-Tex mesh removal, debridement of abdominal wall and peritoneum Antibiotics:  IV Zosyn 08/14/2013  IV Vancomycin 08/15/13     Procedures/Studies: Ct Abdomen Pelvis Wo Contrast  08/14/2013   CLINICAL DATA:  Fever, leukocytosis, prior hernia repairs and appendectomy  EXAM: CT ABDOMEN AND PELVIS WITHOUT CONTRAST  TECHNIQUE: Multidetector CT imaging of the abdomen and pelvis was performed following the standard protocol without intravenous contrast.  COMPARISON:  None.  FINDINGS: Note: Given body habitus, the entire anterior abdominal wall (including a portion of the abnormality) could not be included in the field of view despite two separate attempts.  Prior ventral hernia mesh repair. Just anterior to the mesh, within the  anterior abdominal wall, is an abdominal wall fluid collection/abscess measuring  approximately 14.5 x 18.1 x 26.1 cm (incompletely visualized). Along the superior aspect of the fluid collection, gas extends to immediately beneath the skin surface (series 10/image 27). Bowel loops are displaced posteriorly by the large collection fluid without definite communication.  A small hernia containing fat and a loop of nondilated colon is present inferiorly (sagittal image 88), to the left of midline (series 10/image 81 and 86).  Minimal left basilar atelectasis.  Cardiomegaly. Coronary atherosclerosis. ICD leads, incompletely visualized.  Moderate hepatic steatosis.  Unenhanced spleen and pancreas are unremarkable.  Low-density nodular thickening of the bilateral adrenal glands, likely reflecting adrenal adenomas.  Gallbladder is notable for layering gallstone. No intrahepatic or extrahepatic ductal dilatation.  Kidneys are unremarkable. No renal calculi or hydronephrosis.  No evidence of bowel obstruction. Prior appendectomy.  Atherosclerotic calcifications of the abdominal aorta and branch vessels.  No abdominopelvic ascites.  No suspicious abdominopelvic lymphadenopathy.  Prostate is unremarkable.  Bladder is within normal limits.  Degenerative changes of the visualized thoracolumbar spine.  IMPRESSION: 14.5 x 18.1 x 26.1 cm fluid collection/abscess within the anterior abdominal wall, as described above, incompletely visualized. The collection appears just anterior to prior ventral hernia mesh.  Given the size and proximity to the skin surface, incision and drainage is suggested.  These results were called by telephone at the time of interpretation on 08/14/2013 at 3:22 PM to Dr. Irine Seal , who verbally acknowledged these results.   Electronically Signed   By: Julian Hy M.D.   On: 08/14/2013 15:28   Dg Chest 2 View  08/01/2013   CLINICAL DATA:  Chest pain, shortness of breath.  EXAM: CHEST  2 VIEW  COMPARISON:  August 24, 2012.  FINDINGS: Stable cardiomegaly. Left-sided pacemaker  is noted. No pleural effusion or pneumothorax is noted. Mild central pulmonary vascular congestion is noted which appears to be chronic. No acute pulmonary disease is noted.  IMPRESSION: No active cardiopulmonary disease.   Electronically Signed   By: Sabino Dick M.D.   On: 08/01/2013 17:06   Dg Hip Complete Right  08/11/2013   CLINICAL DATA:  Right posterior hip pain.  EXAM: RIGHT HIP - COMPLETE 2+ VIEW  COMPARISON:  None.  FINDINGS: There is no evidence of hip fracture or dislocation. There is no evidence of arthropathy or other focal bone abnormality.  IMPRESSION: No acute osseous injury of the right hip.   Electronically Signed   By: Kathreen Devoid   On: 08/11/2013 15:16   Ct Angio Chest Pe W/cm &/or Wo Cm  08/01/2013   CLINICAL DATA:  Shortness of Breath,  EXAM: CT ANGIOGRAPHY CHEST WITH CONTRAST  TECHNIQUE: Multidetector CT imaging of the chest was performed using the standard protocol during bolus administration of intravenous contrast. Multiplanar CT image reconstructions including MIPs were obtained to evaluate the vascular anatomy.  CONTRAST:  173mL OMNIPAQUE IOHEXOL 350 MG/ML SOLN  COMPARISON:  06/28/2012  FINDINGS: There is fairly good contrast opacification of the pulmonary artery branches with no convincing filling defects to suggest acute PE. There is good contrast opacification of the thoracic aorta with some scattered plaque, no evidence of dissection or stenosis. There is dilatation of the ascending aorta measuring up to 4.5 cm transverse diameter, stable by my measurement since prior study. Bovine brachiocephalic arterial origin anatomy without proximal stenosis. No pleural or pericardial effusion. No hilar or mediastinal adenopathy. Transvenous pacing leads are noted. Minimal linear scarring or subsegmental atelectasis in both  lower lobes. Lungs otherwise clear. Visualized portions of upper abdomen unremarkable. Multiple bridging osteophytes throughout the mid and lower thoracic spine.  Sternum intact.  Review of the MIP images confirms the above findings.  IMPRESSION: 1. 4.5 cm ascending aortic aneurysm without complicating features. 2. No evidence of acute pulmonary embolism or aortic dissection.   Electronically Signed   By: Arne Cleveland M.D.   On: 08/01/2013 19:55   Dg Chest Port 1 View  08/17/2013   CLINICAL DATA:  Respiratory failure  EXAM: PORTABLE CHEST - 1 VIEW  COMPARISON:  08/15/2013.  FINDINGS: Persistent cardiomegaly with pulmonary vascular prominence and interstitial prominence suggesting congestive heart failure with interstitial edema noted. Interstitial pneumonitis cannot be excluded. Cardiac pacer. Lead tips in stable position. No pneumothorax. No acute osseous abnormality.  IMPRESSION: Stable chest with changes of congestive heart failure with pulmonary interstitial edema. Cardiac pacer in stable position.   Electronically Signed   By: Marcello Moores  Register   On: 08/17/2013 07:41   Dg Chest Portable 1 View  08/15/2013   CLINICAL DATA:  Sepsis, respiratory distress  EXAM: PORTABLE CHEST - 1 VIEW  COMPARISON:  Prior radiograph from 08/13/2013  FINDINGS: Left-sided pacemaker is unchanged. Cardiomegaly is stable as compared to the prior exam.  Lungs are normally inflated. There has been worsening of pulmonary vascular congestion and edema as compared to the prior examination. No definite pleural effusion. No focal infiltrates appreciated. No pneumothorax.  Osseous structures are unchanged.  IMPRESSION: Stable cardiomegaly with interval worsening of pulmonary vascular congestion/ edema relative to 08/13/2013.   Electronically Signed   By: Jeannine Boga M.D.   On: 08/15/2013 01:16   Dg Chest Port 1 View  08/13/2013   CLINICAL DATA:  Leukocytosis.  EXAM: PORTABLE CHEST - 1 VIEW  COMPARISON:  08/01/2013  FINDINGS: No focal pulmonary consolidation is identified. There is stable mild pulmonary venous prominence without overt edema or visualized pleural fluid. The heart  remains mildly enlarged. There is stable appearance of a biventricular pacemaker.  IMPRESSION: No acute findings. Stable cardiomegaly and pulmonary venous prominence without overt edema.   Electronically Signed   By: Aletta Edouard M.D.   On: 08/13/2013 15:44         Subjective: Patient denies fevers, chills, chest discomfort, shortness of breath, nausea, vomiting, dysuria. Abdominal pain is fairly controlled.  Objective: Filed Vitals:   08/17/13 1605 08/17/13 2108 08/18/13 0600 08/18/13 1420  BP: 139/65 147/75 152/82 154/73  Pulse: 62 59 76 75  Temp: 98.1 F (36.7 C) 98.1 F (36.7 C) 97.7 F (36.5 C) 98.4 F (36.9 C)  TempSrc: Oral Oral Oral Oral  Resp: 22 21 12 18   Height:      Weight:      SpO2: 100% 100% 99% 97%    Intake/Output Summary (Last 24 hours) at 08/18/13 1845 Last data filed at 08/18/13 1826  Gross per 24 hour  Intake    893 ml  Output   1025 ml  Net   -132 ml   Weight change:  Exam:   General:  Pt is alert, follows commands appropriately, not in acute distress  HEENT: No icterus, No thrush,  [place]/AT  Cardiovascular: RRR, S1/S2, no rubs, no gallops  Respiratory: Bibasilar crackles. No wheezing. Good air movement.  Abdomen: Soft/+BS,ostomy pouch over ventral wound with patchy areas of erythema without necrosis or crepitance  Extremities: 1+ LE edema, No lymphangitis, No petechiae, No rashes, no synovitis  Data Reviewed: Basic Metabolic Panel:  Recent Labs Lab 08/15/13  [id number] 08/15/13 0346 08/16/13 0342 08/17/13 0325 08/18/13 0645  NA 130* 130* 132* 134* 136  K 3.5 3.7 4.1 4.4 3.9  CL 93* 94* 97 99 101  CO2 25 25 26 26 28   GLUCOSE 171* 207* 214* 202* 116*  BUN 36* 36* 40* 30* 18  CREATININE 1.93* 1.98* 2.03* 1.48* 1.04  CALCIUM 8.7 8.5 8.9 9.1 9.0  MG  --   --  1.9  --   --    Liver Function Tests:  Recent Labs Lab 08/13/13 2007 08/15/13 0049  AST 80* 53*  ALT 37 27  ALKPHOS 53 40  BILITOT 0.9 1.1  PROT 7.1 6.3  ALBUMIN 2.2*  1.9*   No results found for this basename: LIPASE, AMYLASE,  in the last 168 hours No results found for this basename: AMMONIA,  in the last 168 hours CBC:  Recent Labs Lab 08/13/13 0512  08/15/13 0049 08/15/13 0346 08/16/13 0342 08/17/13 0325 08/18/13 0645  WBC 18.4*  < > 18.9* 17.9* 15.3* 12.3* 11.4*  NEUTROABS 14.8*  --   --   --  11.9* 9.4* 8.5*  HGB 13.3  < > 11.7* 11.6* 11.2* 12.1* 12.4*  HCT 39.1  < > 34.3* 34.2* 33.5* 36.7* 37.3*  MCV 95.6  < > 94.8 94.2 96.5 97.1 96.6  PLT 363  < > 324 308 312 366 402*  < > = values in this interval not displayed. Cardiac Enzymes:  Recent Labs Lab 08/11/13 1954 08/12/13 0050 08/12/13 0655 08/15/13 0049  TROPONINI <0.30 <0.30 <0.30 <0.30   BNP: No components found with this basename: POCBNP,  CBG:  Recent Labs Lab 08/17/13 2216 08/18/13 0026 08/18/13 0902 08/18/13 1202 08/18/13 1656  GLUCAP 169* 151* 86 72 144*    Recent Results (from the past 240 hour(s))  URINE CULTURE     Status: None   Collection Time    08/11/13  6:24 PM      Result Value Range Status   Specimen Description URINE, CLEAN CATCH   Final   Special Requests NONE   Final   Culture  Setup Time     Final   Value: 08/12/2013 02:25     Performed at [facility]     Final   Value: NO GROWTH     Performed at [facility]   Culture     Final   Value: NO GROWTH     Performed at [facility]   Report Status 08/13/2013 FINAL   Final  CULTURE, BLOOD (ROUTINE X 2)     Status: None   Collection Time    08/13/13  2:55 PM      Result Value Range Status   Specimen Description BLOOD LEFT HAND   Final   Special Requests BOTTLES DRAWN AEROBIC ONLY 10CC   Final   Culture  Setup Time     Final   Value: 08/13/2013 22:30     Performed at [facility]   Culture     Final   Value:        BLOOD CULTURE RECEIVED NO GROWTH TO DATE CULTURE WILL BE HELD FOR 5 DAYS BEFORE ISSUING A FINAL NEGATIVE REPORT     Performed at  [facility]   Report Status PENDING   Incomplete  CULTURE, BLOOD (ROUTINE X 2)     Status: None   Collection Time    08/13/13  3:00 PM      Result Value Range  Status   Specimen Description BLOOD LEFT ARM   Final   Special Requests BOTTLES DRAWN AEROBIC AND ANAEROBIC 10CC   Final   Culture  Setup Time     Final   Value: 08/13/2013 22:31     Performed at [facility]   Culture     Final   Value:        BLOOD CULTURE RECEIVED NO GROWTH TO DATE CULTURE WILL BE HELD FOR 5 DAYS BEFORE ISSUING A FINAL NEGATIVE REPORT     Performed at [facility]   Report Status PENDING   Incomplete  URINE CULTURE     Status: None   Collection Time    08/13/13  3:11 PM      Result Value Range Status   Specimen Description URINE, CLEAN CATCH   Final   Special Requests NONE   Final   Culture  Setup Time     Final   Value: 08/14/2013 06:01     Performed at [facility] Count     Final   Value: NO GROWTH     Performed at [facility]   Culture     Final   Value: NO GROWTH     Performed at [facility]   Report Status 08/15/2013 FINAL   Final  SURGICAL PCR SCREEN     Status: None   Collection Time    08/14/13  4:35 PM      Result Value Range Status   MRSA, PCR NEGATIVE  NEGATIVE Final   Staphylococcus aureus NEGATIVE  NEGATIVE Final   Comment:            The Xpert SA Assay (FDA     approved for NASAL specimens     in patients over 3 years of age),     is one component of     a comprehensive surveillance     program.  Test performance has     been validated by [facility] for patients greater     than or equal to 53 year old.     It is not intended     to diagnose infection nor to     guide or monitor treatment.  CULTURE, ROUTINE-ABSCESS     Status: None   Collection Time    08/14/13  6:28 PM      Result Value Range Status   Specimen Description ABDOMEN INCISION DRAINAGE   Final   Special Requests NONE   Final   Gram Stain      Final   Value: ABUNDANT WBC PRESENT, PREDOMINANTLY PMN     NO SQUAMOUS EPITHELIAL CELLS SEEN     MODERATE GRAM POSITIVE COCCI IN PAIRS     IN CLUSTERS     Performed at [facility]   Culture     Final   Value: RARE STAPHYLOCOCCUS SPECIES (COAGULASE NEGATIVE)     Performed at [facility]   Report Status 08/18/2013 FINAL   Final  ANAEROBIC CULTURE     Status: None   Collection Time    08/14/13  6:28 PM      Result Value Range Status   Specimen Description ABDOMEN INCISION DRAINAGE   Final   Special Requests NONE   Final   Gram Stain PENDING   Incomplete   Culture     Final   Value: NO ANAEROBES ISOLATED; CULTURE IN PROGRESS FOR 5 DAYS  Performed at [facility]   Report Status PENDING   Incomplete  CULTURE, BLOOD (ROUTINE X 2)     Status: None   Collection Time    08/15/13 12:40 AM      Result Value Range Status   Specimen Description BLOOD LEFT ARM   Final   Special Requests BOTTLES DRAWN AEROBIC AND ANAEROBIC 5CC   Final   Culture  Setup Time     Final   Value: 08/15/2013 13:18     Performed at [facility]   Culture     Final   Value:        BLOOD CULTURE RECEIVED NO GROWTH TO DATE CULTURE WILL BE HELD FOR 5 DAYS BEFORE ISSUING A FINAL NEGATIVE REPORT     Performed at [facility]   Report Status PENDING   Incomplete  CULTURE, BLOOD (ROUTINE X 2)     Status: None   Collection Time    08/15/13 12:50 AM      Result Value Range Status   Specimen Description BLOOD LEFT ARM   Final   Special Requests BOTTLES DRAWN AEROBIC AND ANAEROBIC 5CC   Final   Culture  Setup Time     Final   Value: 08/15/2013 13:18     Performed at [facility]   Culture     Final   Value:        BLOOD CULTURE RECEIVED NO GROWTH TO DATE CULTURE WILL BE HELD FOR 5 DAYS BEFORE ISSUING A FINAL NEGATIVE REPORT     Performed at [facility]   Report Status PENDING   Incomplete  URINE CULTURE     Status: None   Collection Time    08/15/13  2:37  AM      Result Value Range Status   Specimen Description URINE, CATHETERIZED   Final   Special Requests NONE   Final   Culture  Setup Time     Final   Value: 08/15/2013 16:08     Performed at [facility]   Culture     Final   Value: NO GROWTH     Performed at [facility]   Report Status 08/17/2013 FINAL   Final  CLOSTRIDIUM DIFFICILE BY PCR     Status: None   Collection Time    08/15/13  9:04 AM      Result Value Range Status   C difficile by pcr NEGATIVE  NEGATIVE Final   Comment: Performed at [facility]     Scheduled Meds: . acetaminophen  1,000 mg Oral TID  . atorvastatin  40 mg Oral QODAY  . enoxaparin (LOVENOX) injection  80 mg Subcutaneous Q24H  . furosemide  40 mg Intravenous Q12H  . [START ON 08/19/2013] furosemide  80 mg Oral BID  . insulin aspart  0-20 Units Subcutaneous Q4H  . insulin glargine  65 Units Subcutaneous QHS  . lip balm  1 application Topical BID  . piperacillin-tazobactam (ZOSYN)  IV  3.375 g Intravenous Q8H  . psyllium  1 packet Oral Daily  . saccharomyces boulardii  250 mg Oral BID  . sodium chloride  3 mL Intravenous Q12H  . vancomycin  2,000 mg Intravenous Q24H   Continuous Infusions: . [name] KitchenTPN (CLINIMIX-E) Adult     And  . fat emulsion       Siham Bucaro, DO  [facility] Pager [phone]  If 7PM-7AM, please contact night-coverage [email or web address] Password [facility] 08/18/2013, 6:45 PM  LOS: 7 days

## 2013-08-18 NOTE — Consult Note (Signed)
Wound care follow-up: Requested by CCS PA Will Creig Hines to re-assess full thickness abd wound.  Bedside nurses state that it began having large amt brown drainage today.  Wound appearance unchanged from previous description and photo in [facility] progress note on 11/10.  Large amt drainage appears to be from left or lower wound edges.  Difficult to determine source with swab since there is extensive undermining to all edges.  Gauze dressing applied 2 hours earlier now saturated with fecal-appearing thick brown drainage, no odor. Discussed plan of care with Will Jennings.  He has notified Dr Redmond Pulling who plans to assess wound today. Pt could benefit from a fistulogram to determine location of drainage.  Wall suction or pouch to control drainage could be implemented after diagnostic test if desired, but it will be a complex system R/T size of wound. Wound packed with moist gauze until further input from CCS team.  Julien Girt MSN, RN, [place], [place], [place]

## 2013-08-18 NOTE — Progress Notes (Signed)
OT Cancellation Note  Patient Details Name: NOXX REECK MRN: [id number] DOB: 1950/11/19   Cancelled Treatment:    Reason Eval/Treat Not Completed: Other (comment). Nursing in to do a dressing change. Will try back another time.  Jules Schick [id number] 08/18/2013, 12:04 PM

## 2013-08-18 NOTE — Progress Notes (Signed)
Patient refuses CPAP for tonight, verbalizes understanding RT available for assistance if this decision changes.

## 2013-08-18 NOTE — Progress Notes (Signed)
4 Days Post-Op  Subjective: Dressing taken down for AM change and he has allot of drainage that is new.  Nurse reported it was green, but looking closer at it now he has brown colored drainage, that looks feculent. It doesn't smell like stool I had them place a wet dressing and leave open till Dr. Redmond Pulling could view. Objective: Vital signs in last 24 hours: Temp:  [97.7 F (36.5 C)-98.6 F (37 C)] 97.7 F (36.5 C) (11/12 0600) Pulse Rate:  [59-76] 76 (11/12 0600) Resp:  [12-29] 12 (11/12 0600) BP: (139-159)/(65-83) 152/82 mmHg (11/12 0600) SpO2:  [98 %-100 %] 99 % (11/12 0600) Last BM Date: 08/15/13 PO 740 Cardiac Diet Afebrile, VSS Labs stable, WBC trending down. Intake/Output from previous day: 11/11 0701 - 11/12 0700 In: [id number] [P.O.:740; I.V.:3; IV Piggyback:600] Out: 1225 [Urine:1225] Intake/Output this shift:    General appearance: alert, cooperative and no distress GI: open wound looks about the same.  New brown-green drainage in the wound.   Re-exam later show more stool/feculent material from a site we found mid abdomen to the left side.    Lab Results:   Recent Labs  08/17/13 0325 08/18/13 0645  WBC 12.3* 11.4*  HGB 12.1* 12.4*  HCT 36.7* 37.3*  PLT 366 402*    BMET  Recent Labs  08/17/13 0325 08/18/13 0645  NA 134* 136  K 4.4 3.9  CL 99 101  CO2 26 28  GLUCOSE 202* 116*  BUN 30* 18  CREATININE 1.48* 1.04  CALCIUM 9.1 9.0   PT/INR No results found for this basename: LABPROT, INR,  in the last 72 hours   Recent Labs Lab 08/13/13 2007 08/15/13 0049  AST 80* 53*  ALT 37 27  ALKPHOS 53 40  BILITOT 0.9 1.1  PROT 7.1 6.3  ALBUMIN 2.2* 1.9*     Lipase  No results found for this basename: lipase     Studies/Results: Dg Chest Port 1 View  08/17/2013   CLINICAL DATA:  Respiratory failure  EXAM: PORTABLE CHEST - 1 VIEW  COMPARISON:  08/15/2013.  FINDINGS: Persistent cardiomegaly with pulmonary vascular prominence and interstitial  prominence suggesting congestive heart failure with interstitial edema noted. Interstitial pneumonitis cannot be excluded. Cardiac pacer. Lead tips in stable position. No pneumothorax. No acute osseous abnormality.  IMPRESSION: Stable chest with changes of congestive heart failure with pulmonary interstitial edema. Cardiac pacer in stable position.   Electronically Signed   By: Marcello Moores  Register   On: 08/17/2013 07:41    Medications: . acetaminophen  1,000 mg Oral TID  . atorvastatin  40 mg Oral QODAY  . enoxaparin (LOVENOX) injection  80 mg Subcutaneous Q24H  . furosemide  40 mg Intravenous Q12H  . insulin aspart  0-20 Units Subcutaneous TID WC  . insulin glargine  65 Units Subcutaneous QHS  . lip balm  1 application Topical BID  . piperacillin-tazobactam (ZOSYN)  IV  3.375 g Intravenous Q8H  . psyllium  1 packet Oral Daily  . saccharomyces boulardii  250 mg Oral BID  . sodium chloride  3 mL Intravenous Q12H  . vancomycin  2,000 mg Intravenous Q24H    Assessment/Plan Giant abdominal abscess with infected old GoreTex mesh POD #4 s/p I&D abscess, removal of mesh, debridement of abdominal wall & peritoneum  New small bowel EC fistula Sepsis 2* above - resolving, WBC trending down at 12.3  Day 5 antibiotics day 5 Zosyn, day 4 Vancomycin, being followed by ID/plan empiric treatment for 14 days. Acute  on chronic kidney disease Stage III - Cr. down to 1.48  H/o OSA, DM, HTN, HLD, CHF, AAA    Plan:  NPO, we are going to try and fashion some type of EC pouching to contain the fistula.  Make him NPO except for meds and few ice chips.  Start TNA/PICC line today.  I will talk with Dr. Carles Collet about it also.       LOS: 7 days    Juni Glaab 08/18/2013    Wound post op 08/16/13.

## 2013-08-18 NOTE — Progress Notes (Signed)
Pt seen and examined last pm around 7:15pm  Doing well. Tolerating diet and dressing changes.  +BM  Cont current wound care for now F/u cx - so far intraop cx are NTD except for G stain; will defer abx to ID;however, in reading dr gross' op note there was a large amount of purulent fluid With respect to anticoagulation - i would hold off another couple of days before starting xarelto since it can't be reversed. Ok with other short-acting or reversible blood thinners  Leighton Ruff. Redmond Pulling, MD, FACS General, Bariatric, & Minimally Invasive Surgery [facility], [place]

## 2013-08-18 NOTE — Progress Notes (Signed)
Kurt Cross for Infectious Disease    Date of Admission:  08/11/2013   Total days of antibiotics 5        Day 5 piptazo        Day 4 vanco           ID: Kurt Cross is a 62 y.o. male  with a remote history of bowel perforation requiring exploratory laparotomy and temporary colostomy. He then developed a ventral hernia and underwent mesh repair decades ago. He was recently hospitalized and treated for possible pneumonia. He had a fall at home recently and injured his right lower leg leading to readmission. He was noted to be febrile with some abdominal wall swelling. CT scan revealed a large abdominal wall abscess. POD#3 from drainage of 3 L of pus and removal of his old Gore-Tex mesh; abdominal wound now concern for fistulous track due to feculant material found in wound bed.  Principal Problem:   Infected prosthetic mesh of abdominal wall with GIANT abscess s/p removal 08/14/2013 Active Problems:   Diabetes mellitus, insulin dependent (IDDM), uncontrolled   HYPERLIPIDEMIA   Obesity, Class III, BMI 40-49.9 (morbid obesity)   OBSTRUCTIVE SLEEP APNEA   Hypertension   Acute-on-chronic respiratory failure secondary to probable community-acquired pneumonia   Fever   Sepsis   Constipation, chronic   Chronic combined systolic and diastolic CHF (congestive heart failure)   Right leg pain   Leukocytosis   AAA (abdominal aortic aneurysm)/ 4.5 cm ascending per CT angio 08/01/13   Recurrent ventral incisional hernia   Acute renal failure (ARF)    Subjective: Afebrile  Medications:  . acetaminophen  1,000 mg Oral TID  . atorvastatin  40 mg Oral QODAY  . enoxaparin (LOVENOX) injection  80 mg Subcutaneous Q24H  . furosemide  40 mg Intravenous Q12H  . insulin aspart  0-20 Units Subcutaneous TID WC  . insulin glargine  65 Units Subcutaneous QHS  . lip balm  1 application Topical BID  . piperacillin-tazobactam (ZOSYN)  IV  3.375 g Intravenous Q8H  . psyllium  1 packet Oral Daily  .  saccharomyces boulardii  250 mg Oral BID  . sodium chloride  3 mL Intravenous Q12H  . vancomycin  2,000 mg Intravenous Q24H    Objective: Vital signs in last 24 hours: Temp:  [97.7 F (36.5 C)-98.4 F (36.9 C)] 98.4 F (36.9 C) (11/12 1420) Pulse Rate:  [59-76] 75 (11/12 1420) Resp:  [12-22] 18 (11/12 1420) BP: (139-154)/(65-82) 154/73 mmHg (11/12 1420) SpO2:  [97 %-100 %] 97 % (11/12 1420)  General: Alert and comfortable talking on the telephone  Skin: No rash, splitter or conjunctival hemorrhages  Lungs: Clear  Cor: Regular S1 and S2 with no murmurs  Abdomen: Obese and distended.  dressing in place   Lab Results  Recent Labs  08/17/13 0325 08/18/13 0645  WBC 12.3* 11.4*  HGB 12.1* 12.4*  HCT 36.7* 37.3*  NA 134* 136  K 4.4 3.9  CL 99 101  CO2 26 28  BUN 30* 18  CREATININE 1.48* 1.04   Liver Panel No results found for this basename: PROT, ALBUMIN, AST, ALT, ALKPHOS, BILITOT, BILIDIR, IBILI,  in the last 72 hours  Microbiology: 11/9 wound cx: NGTD, gram stain GPC in prs and clusters 11/9 blood cx: NGTD  Studies/Results: Dg Chest Port 1 View  08/17/2013   CLINICAL DATA:  Respiratory failure  EXAM: PORTABLE CHEST - 1 VIEW  COMPARISON:  08/15/2013.  FINDINGS: Persistent cardiomegaly with pulmonary  vascular prominence and interstitial prominence suggesting congestive heart failure with interstitial edema noted. Interstitial pneumonitis cannot be excluded. Cardiac pacer. Lead tips in stable position. No pneumothorax. No acute osseous abnormality.  IMPRESSION: Stable chest with changes of congestive heart failure with pulmonary interstitial edema. Cardiac pacer in stable position.   Electronically Signed   By: Marcello Moores  Register   On: 08/17/2013 07:41     Assessment/Plan: Abdominal abscess involving abdominal mesh since removed, now complicated by possible EC fistula - continue with broad spectrum antibiotics. Cultures remain negative. Will need to continue with empiric  treatment for at least 2 wks. - defer to surgery to determine how to contain fistula  Kurt Cross, [facility] for Infectious Diseases Cell: [phone] Pager: [phone]  08/18/2013, 3:04 PM

## 2013-08-19 LAB — CULTURE, BLOOD (ROUTINE X 2)

## 2013-08-19 LAB — DIFFERENTIAL
Basophils Relative: 0 % (ref 0–1)
Eosinophils Absolute: 0.4 10*3/uL (ref 0.0–0.7)
Eosinophils Relative: 4 % (ref 0–5)
Lymphs Abs: 1.8 10*3/uL (ref 0.7–4.0)
Monocytes Relative: 10 % (ref 3–12)
Neutro Abs: 7.9 10*3/uL — ABNORMAL HIGH (ref 1.7–7.7)
Neutrophils Relative %: 70 % (ref 43–77)

## 2013-08-19 LAB — CBC
Hemoglobin: 12.1 g/dL — ABNORMAL LOW (ref 13.0–17.0)
MCH: 32.1 pg (ref 26.0–34.0)
MCHC: 33 g/dL (ref 30.0–36.0)
MCV: 97.3 fL (ref 78.0–100.0)
RBC: 3.77 MIL/uL — ABNORMAL LOW (ref 4.22–5.81)
RDW: 14.7 % (ref 11.5–15.5)

## 2013-08-19 LAB — COMPREHENSIVE METABOLIC PANEL
ALT: 21 U/L (ref 0–53)
AST: 36 U/L (ref 0–37)
BUN: 14 mg/dL (ref 6–23)
CO2: 30 mEq/L (ref 19–32)
Chloride: 101 mEq/L (ref 96–112)
Creatinine, Ser: 1.08 mg/dL (ref 0.50–1.35)
GFR calc Af Amer: 84 mL/min — ABNORMAL LOW (ref >90)
GFR calc non Af Amer: 73 mL/min — ABNORMAL LOW (ref >90)
Glucose, Bld: 103 mg/dL — ABNORMAL HIGH (ref 70–99)
Total Bilirubin: 0.5 mg/dL (ref 0.3–1.2)
Total Protein: 6.8 g/dL (ref 6.0–8.3)

## 2013-08-19 LAB — GLUCOSE, CAPILLARY
Glucose-Capillary: 115 mg/dL — ABNORMAL HIGH (ref 70–99)
Glucose-Capillary: 136 mg/dL — ABNORMAL HIGH (ref 70–99)
Glucose-Capillary: 136 mg/dL — ABNORMAL HIGH (ref 70–99)

## 2013-08-19 LAB — PREALBUMIN: Prealbumin: 10.3 mg/dL — ABNORMAL LOW (ref 17.0–34.0)

## 2013-08-19 LAB — TRIGLYCERIDES: Triglycerides: 124 mg/dL (ref <150)

## 2013-08-19 LAB — MAGNESIUM: Magnesium: 1.5 mg/dL (ref 1.5–2.5)

## 2013-08-19 LAB — CULTURE, ROUTINE-ABSCESS

## 2013-08-19 LAB — PHOSPHORUS: Phosphorus: 3.9 mg/dL (ref 2.3–4.6)

## 2013-08-19 MED ORDER — CARVEDILOL 3.125 MG PO TABS
3.1250 mg | ORAL_TABLET | Freq: Two times a day (BID) | ORAL | Status: DC
  Administered 2013-08-20 – 2013-08-21 (×3): 3.125 mg via ORAL
  Filled 2013-08-19 (×5): qty 1

## 2013-08-19 MED ORDER — HEPARIN (PORCINE) IN NACL 100-0.45 UNIT/ML-% IJ SOLN
1600.0000 [IU]/h | INTRAMUSCULAR | Status: DC
  Administered 2013-08-19: 1600 [IU]/h via INTRAVENOUS
  Filled 2013-08-19: qty 250

## 2013-08-19 MED ORDER — TRACE MINERALS CR-CU-F-FE-I-MN-MO-SE-ZN IV SOLN
INTRAVENOUS | Status: AC
  Administered 2013-08-19: 18:00:00 via INTRAVENOUS
  Filled 2013-08-19: qty 2000

## 2013-08-19 MED ORDER — FAT EMULSION 20 % IV EMUL
250.0000 mL | INTRAVENOUS | Status: AC
  Administered 2013-08-19: 250 mL via INTRAVENOUS
  Filled 2013-08-19: qty 250

## 2013-08-19 MED ORDER — OXYCODONE HCL ER 10 MG PO T12A
10.0000 mg | EXTENDED_RELEASE_TABLET | Freq: Two times a day (BID) | ORAL | Status: DC
  Administered 2013-08-19 – 2013-08-23 (×8): 10 mg via ORAL
  Filled 2013-08-19 (×8): qty 1

## 2013-08-19 NOTE — Progress Notes (Signed)
Physical Therapy Treatment Patient Details Name: Kurt Cross MRN: [id number] DOB: May 17, 1951 Today's Date: 08/19/2013 Time: [id number] PT Time Calculation (min): 34 min  PT Assessment / Plan / Recommendation  History of Present Illness Kurt Cross is a 62 y.o. male  with known history of chronic systolic heart failure status post defibrillator placement last EF measured was in 2013 was 35-40%, on home oxygen, OSA noncompliant with CPAP, diabetes mellitus, hypertension and hyperlipidemia presented to the ER because right leg pain after sustaining a fall the night prior to admission.  08/14/13: s/p abdominal abscess removal   PT Comments   Progressing with mobility.   Follow Up Recommendations  SNF     Does the patient have the potential to tolerate intense rehabilitation     Barriers to Discharge        Equipment Recommendations  None recommended by PT    Recommendations for Other Services OT consult  Frequency Min 3X/week   Progress towards PT Goals Progress towards PT goals: Progressing toward goals  Plan Current plan remains appropriate    Precautions / Restrictions Precautions Precautions: Fall Precaution Comments: abdominal binder-soiled so discarded. Asked RN to order a new one.  Restrictions Weight Bearing Restrictions: No   Pertinent Vitals/Pain Abdomen "always hurting" unrated    Mobility  Bed Mobility Bed Mobility: Supine to Sit Supine to Sit: 3: Mod assist;HOB elevated;With rails Details for Bed Mobility Assistance: Assist for trunk to full upright. Increased time. Pt uses momentum to get to EOB Transfers Transfers: Sit to Stand;Stand to Sit Sit to Stand: 3: Mod assist;From bed;From chair/3-in-1 Stand to Sit: 3: Mod assist;To chair/3-in-1 Details for Transfer Assistance: x 2. Assist to rise, stabilize, control descent. VCS safety, technique, hand placement.  Ambulation/Gait Ambulation/Gait Assistance: 1: +2 Total assist Ambulation/Gait: Patient Percentage:  80% Ambulation Distance (Feet): 135 Feet Assistive device: Rolling walker Ambulation/Gait Assistance Details: Wide BOS. Unsteady but no LOB. Uncontrolled gait speed.  Gait Pattern: Step-through pattern;Wide base of support;Decreased stride length    Exercises     PT Diagnosis:    PT Problem List:   PT Treatment Interventions:     PT Goals (current goals can now be found in the care plan section)    Visit Information  Last PT Received On: 08/19/13 Assistance Needed: +2 History of Present Illness: Kurt Cross is a 62 y.o. male  with known history of chronic systolic heart failure status post defibrillator placement last EF measured was in 2013 was 35-40%, on home oxygen, OSA noncompliant with CPAP, diabetes mellitus, hypertension and hyperlipidemia presented to the ER because right leg pain after sustaining a fall the night prior to admission.  08/14/13: s/p abdominal abscess removal    Subjective Data      Cognition  Cognition Arousal/Alertness: Awake/alert Behavior During Therapy: WFL for tasks assessed/performed Overall Cognitive Status: Within Functional Limits for tasks assessed    Balance     End of Session PT - End of Session Activity Tolerance: Patient tolerated treatment well Patient left: in chair;with call bell/phone within reach;with family/visitor present   GP     Weston Anna, MPT Pager: [phone]

## 2013-08-19 NOTE — Progress Notes (Signed)
Still following Mr Hitt as he recently became active with [facility] Care Management at last hospital visit. Came to visit patient today to touch base. Reports he plans on still going to SNF at discharge. Spoke with inpatient RNCM as well about patient's discharge plan. Marthenia Rolling, MSN-Ed, RN,BSN- [facility] [phone]

## 2013-08-19 NOTE — Progress Notes (Signed)
[facility] PROGRESS NOTE  Kurt Cross [id number] DOB: [birth year] DOA: 08/11/2013 PCP: [name], MD  Assessment/Plan: #1 infected prosthetic Gore-Tex mesh with a giant abdominal abscess status post IandD and Gore-Tex mesh removal 08/14/2013  Patient is status post incision and drainage of abscess, Gore-Tex mesh removal, debridement of abdominal wall and peritoneum 08/14/2013. -empiric IV Vancomycin and Zosyn.  -Blood cultures--neg  -Continue wound care as directed by general surgery.  -now with EC fistula  -PICC and TNA per surgery  -abx per ID  -start long acting oxycontin 10mg  q 12hrs for pain control #2 sepsis  Likely secondary to problem #1.  -Wound cultures prelim with GPC in pairs and clusters-->Coag Neg Staph  #3 hypotension  -resolved  -secondary to problem #2.  -Blood pressure responded to IV fluids.  #4 atrial fibrillation/atrial flutter  Likely secondary to problem #1.  -currently rate controlled. EKG showed flutter waves.  -Cardiac enzymes have been negative x3. 2-D echo with EF of 50-55%. Wall motion abnormalities cannot be excluded.  -CHADS 2 score is about 3.  -Continue Coreg for rate control.  -Eliquis was discontinued as patient went for emergent surgery secondary to problem #1. General surgery to advise on anticoagulation, eliquis, may be resumed.  -start heparin drip until surgery clears pt for Eliquis -scheduled for TEE cardioversion today however in light of problem #1 this may be postponed. Will defer to cardiology.  - F/u with cardiology 2-3 weeks post discharge.  #5 right lower extremity pain  Secondary to fall. Pain is more in the posterior thigh/hamstrings and posterior Likely musculoskeletal in nature. Patient with clinical improvement. Continue warm compresses. PT/OT. Follow  #6 leukocytosis/fever  Likely secondary to problem #1. Currently afebrile. White count is slowly trending down. Chest x-Beam is negative for infiltrate. Patient with no  respiratory symptoms.  #7 chronic combined systolic and diastolic CHF  CXR with volume overload. Will give 4 doses of lasix 40mg  IV, then back to home dose. Stable.  -Restart low-dose carvedilol -restarted home po lasix am 11/13  #8 chronic kidney disease stage III  -Creatinine is trending back down. ACE inhibitor and Lasix had been discontinued.  -Plan to restart ACE inhibitor if renal function remains stable #9 hyperlipidemia  Continue statin.  #10 4.5 cm ascending aortic aneurysm per CT scan of 08/01/2013  Stable. Asymptomatic. Continue risk factor modification with good blood pressure control. Will need outpatient followup with vascular surgery as outpatient.  #11 hypotension  Patient noted to be hypotensive postoperatively. BP improved and stable. NSL IVF. Fluid boluses PRN. Neosynephrine off. -Hypotension has resolved  -Restart low-dose carvedilol #12 type 2 diabetes  Hemoglobin A1c was 9.5 on 08/02/2013.Continue Lantus 65 units daily. -Sliding scale insulin. -CBGs controlled on TNA   Family Communication:   daughter at beside Disposition Plan:   Home when medically stable      Procedures/Studies: Ct Abdomen Pelvis Wo Contrast  08/14/2013   CLINICAL DATA:  Fever, leukocytosis, prior hernia repairs and appendectomy  EXAM: CT ABDOMEN AND PELVIS WITHOUT CONTRAST  TECHNIQUE: Multidetector CT imaging of the abdomen and pelvis was performed following the standard protocol without intravenous contrast.  COMPARISON:  None.  FINDINGS: Note: Given body habitus, the entire anterior abdominal wall (including a portion of the abnormality) could not be included in the field of view despite two separate attempts.  Prior ventral hernia mesh repair. Just anterior to the mesh, within the anterior abdominal wall, is an abdominal wall fluid collection/abscess measuring approximately 14.5 x 18.1 x 26.1 cm (  incompletely visualized). Along the superior aspect of the fluid collection, gas extends to  immediately beneath the skin surface (series 10/image 27). Bowel loops are displaced posteriorly by the large collection fluid without definite communication.  A small hernia containing fat and a loop of nondilated colon is present inferiorly (sagittal image 88), to the left of midline (series 10/image 81 and 86).  Minimal left basilar atelectasis.  Cardiomegaly. Coronary atherosclerosis. ICD leads, incompletely visualized.  Moderate hepatic steatosis.  Unenhanced spleen and pancreas are unremarkable.  Low-density nodular thickening of the bilateral adrenal glands, likely reflecting adrenal adenomas.  Gallbladder is notable for layering gallstone. No intrahepatic or extrahepatic ductal dilatation.  Kidneys are unremarkable. No renal calculi or hydronephrosis.  No evidence of bowel obstruction. Prior appendectomy.  Atherosclerotic calcifications of the abdominal aorta and branch vessels.  No abdominopelvic ascites.  No suspicious abdominopelvic lymphadenopathy.  Prostate is unremarkable.  Bladder is within normal limits.  Degenerative changes of the visualized thoracolumbar spine.  IMPRESSION: 14.5 x 18.1 x 26.1 cm fluid collection/abscess within the anterior abdominal wall, as described above, incompletely visualized. The collection appears just anterior to prior ventral hernia mesh.  Given the size and proximity to the skin surface, incision and drainage is suggested.  These results were called by telephone at the time of interpretation on 08/14/2013 at 3:22 PM to Dr. Irine Seal , who verbally acknowledged these results.   Electronically Signed   By: Julian Hy M.D.   On: 08/14/2013 15:28   Dg Chest 2 View  08/01/2013   CLINICAL DATA:  Chest pain, shortness of breath.  EXAM: CHEST  2 VIEW  COMPARISON:  August 24, 2012.  FINDINGS: Stable cardiomegaly. Left-sided pacemaker is noted. No pleural effusion or pneumothorax is noted. Mild central pulmonary vascular congestion is noted which appears to be  chronic. No acute pulmonary disease is noted.  IMPRESSION: No active cardiopulmonary disease.   Electronically Signed   By: Sabino Dick M.D.   On: 08/01/2013 17:06   Dg Hip Complete Right  08/11/2013   CLINICAL DATA:  Right posterior hip pain.  EXAM: RIGHT HIP - COMPLETE 2+ VIEW  COMPARISON:  None.  FINDINGS: There is no evidence of hip fracture or dislocation. There is no evidence of arthropathy or other focal bone abnormality.  IMPRESSION: No acute osseous injury of the right hip.   Electronically Signed   By: Kathreen Devoid   On: 08/11/2013 15:16   Ct Angio Chest Pe W/cm &/or Wo Cm  08/01/2013   CLINICAL DATA:  Shortness of Breath,  EXAM: CT ANGIOGRAPHY CHEST WITH CONTRAST  TECHNIQUE: Multidetector CT imaging of the chest was performed using the standard protocol during bolus administration of intravenous contrast. Multiplanar CT image reconstructions including MIPs were obtained to evaluate the vascular anatomy.  CONTRAST:  176mL OMNIPAQUE IOHEXOL 350 MG/ML SOLN  COMPARISON:  06/28/2012  FINDINGS: There is fairly good contrast opacification of the pulmonary artery branches with no convincing filling defects to suggest acute PE. There is good contrast opacification of the thoracic aorta with some scattered plaque, no evidence of dissection or stenosis. There is dilatation of the ascending aorta measuring up to 4.5 cm transverse diameter, stable by my measurement since prior study. Bovine brachiocephalic arterial origin anatomy without proximal stenosis. No pleural or pericardial effusion. No hilar or mediastinal adenopathy. Transvenous pacing leads are noted. Minimal linear scarring or subsegmental atelectasis in both lower lobes. Lungs otherwise clear. Visualized portions of upper abdomen unremarkable. Multiple bridging osteophytes throughout the mid  and lower thoracic spine. Sternum intact.  Review of the MIP images confirms the above findings.  IMPRESSION: 1. 4.5 cm ascending aortic aneurysm without  complicating features. 2. No evidence of acute pulmonary embolism or aortic dissection.   Electronically Signed   By: Arne Cleveland M.D.   On: 08/01/2013 19:55   Dg Chest Port 1 View  08/18/2013   CLINICAL DATA:  Confirm line placement  EXAM: PORTABLE CHEST - 1 VIEW  COMPARISON:  08/17/2013  FINDINGS: Two images were taken. The patient is in semi-upright position and is slightly rotated to the right. A right upper extremity PICC has been placed, the distal tip of which is in the mid superior vena cava. Left chest wall triple lead pacemaker appears stable. Cardiomegaly. There is diffuse pulmonary vascular congestion and similar appearance of pulmonary edema. No pneumothorax or pleural effusion is appreciated.  IMPRESSION: 1. Right upper extremity PICC terminates in the mid superior vena cava. 2. Stable congestive heart failure pattern.   Electronically Signed   By: Curlene Dolphin M.D.   On: 08/18/2013 21:39   Dg Chest Port 1 View  08/17/2013   CLINICAL DATA:  Respiratory failure  EXAM: PORTABLE CHEST - 1 VIEW  COMPARISON:  08/15/2013.  FINDINGS: Persistent cardiomegaly with pulmonary vascular prominence and interstitial prominence suggesting congestive heart failure with interstitial edema noted. Interstitial pneumonitis cannot be excluded. Cardiac pacer. Lead tips in stable position. No pneumothorax. No acute osseous abnormality.  IMPRESSION: Stable chest with changes of congestive heart failure with pulmonary interstitial edema. Cardiac pacer in stable position.   Electronically Signed   By: Marcello Moores  Register   On: 08/17/2013 07:41   Dg Chest Portable 1 View  08/15/2013   CLINICAL DATA:  Sepsis, respiratory distress  EXAM: PORTABLE CHEST - 1 VIEW  COMPARISON:  Prior radiograph from 08/13/2013  FINDINGS: Left-sided pacemaker is unchanged. Cardiomegaly is stable as compared to the prior exam.  Lungs are normally inflated. There has been worsening of pulmonary vascular congestion and edema as compared to  the prior examination. No definite pleural effusion. No focal infiltrates appreciated. No pneumothorax.  Osseous structures are unchanged.  IMPRESSION: Stable cardiomegaly with interval worsening of pulmonary vascular congestion/ edema relative to 08/13/2013.   Electronically Signed   By: Jeannine Boga M.D.   On: 08/15/2013 01:16   Dg Chest Port 1 View  08/13/2013   CLINICAL DATA:  Leukocytosis.  EXAM: PORTABLE CHEST - 1 VIEW  COMPARISON:  08/01/2013  FINDINGS: No focal pulmonary consolidation is identified. There is stable mild pulmonary venous prominence without overt edema or visualized pleural fluid. The heart remains mildly enlarged. There is stable appearance of a biventricular pacemaker.  IMPRESSION: No acute findings. Stable cardiomegaly and pulmonary venous prominence without overt edema.   Electronically Signed   By: Aletta Edouard M.D.   On: 08/13/2013 15:44         Subjective: Patient continues to complain of pain in his abdomen. He states that overall it is not worse than usual. He denies any fevers, chills, chest discomfort, shortness of breath, nausea, vomiting, diarrhea. He is having bowel movements.  Objective: Filed Vitals:   08/18/13 1420 08/18/13 2010 08/19/13 0611 08/19/13 1405  BP: 154/73 141/59 118/60 147/82  Pulse: 75 60 60 60  Temp: 98.4 F (36.9 C) 97.6 F (36.4 C) 98.5 F (36.9 C) 98 F (36.7 C)  TempSrc: Oral Oral Oral Oral  Resp: 18 18 18 16   Height:      Weight:  161.6 kg (356 lb 4.2 oz)   SpO2: 97% 99% 100% 99%    Intake/Output Summary (Last 24 hours) at 08/19/13 1727 Last data filed at 08/19/13 1428  Gross per 24 hour  Intake 1584.16 ml  Output   2525 ml  Net -940.84 ml   Weight change:  Exam:   General:  Pt is alert, follows commands appropriately, not in acute distress  HEENT: No icterus, No thrush,[place]/AT  Cardiovascular: RRR, S1/S2, no rubs, no gallops  Respiratory: Diminished breath sounds at the bases but clear to  auscultation. No wheezing.  Abdomen: Soft/+BS,ostomy pouch  Surrounding ventral wound with feculent material  Extremities: 1+LE edema, No lymphangitis, No petechiae, No rashes, no synovitis  Data Reviewed: Basic Metabolic Panel:  Recent Labs Lab 08/15/13 0346 08/16/13 0342 08/17/13 0325 08/18/13 0645 08/19/13 0435  NA 130* 132* 134* 136 138  K 3.7 4.1 4.4 3.9 3.8  CL 94* 97 99 101 101  CO2 25 26 26 28 30   GLUCOSE 207* 214* 202* 116* 103*  BUN 36* 40* 30* 18 14  CREATININE 1.98* 2.03* 1.48* 1.04 1.08  CALCIUM 8.5 8.9 9.1 9.0 9.2  MG  --  1.9  --   --  1.5  PHOS  --   --   --   --  3.9   Liver Function Tests:  Recent Labs Lab 08/13/13 2007 08/15/13 0049 08/19/13 0435  AST 80* 53* 36  ALT 37 27 21  ALKPHOS 53 40 37*  BILITOT 0.9 1.1 0.5  PROT 7.1 6.3 6.8  ALBUMIN 2.2* 1.9* 2.0*   No results found for this basename: LIPASE, AMYLASE,  in the last 168 hours No results found for this basename: AMMONIA,  in the last 168 hours CBC:  Recent Labs Lab 08/13/13 0512  08/15/13 0346 08/16/13 0342 08/17/13 0325 08/18/13 0645 08/19/13 0435  WBC 18.4*  < > 17.9* 15.3* 12.3* 11.4* 11.2*  NEUTROABS 14.8*  --   --  11.9* 9.4* 8.5* 7.9*  HGB 13.3  < > 11.6* 11.2* 12.1* 12.4* 12.1*  HCT 39.1  < > 34.2* 33.5* 36.7* 37.3* 36.7*  MCV 95.6  < > 94.2 96.5 97.1 96.6 97.3  PLT 363  < > 308 312 366 402* 392  < > = values in this interval not displayed. Cardiac Enzymes:  Recent Labs Lab 08/15/13 0049  TROPONINI <0.30   BNP: No components found with this basename: POCBNP,  CBG:  Recent Labs Lab 08/18/13 2356 08/19/13 0418 08/19/13 0736 08/19/13 1216 08/19/13 1547  GLUCAP 104* 115* 121* 136* 136*    Recent Results (from the past 240 hour(s))  URINE CULTURE     Status: None   Collection Time    08/11/13  6:24 PM      Result Value Range Status   Specimen Description URINE, CLEAN CATCH   Final   Special Requests NONE   Final   Culture  Setup Time     Final   Value:  08/12/2013 02:25     Performed at [facility]     Final   Value: NO GROWTH     Performed at [facility]   Culture     Final   Value: NO GROWTH     Performed at [facility]   Report Status 08/13/2013 FINAL   Final  CULTURE, BLOOD (ROUTINE X 2)     Status: None   Collection Time    08/13/13  2:55 PM  Result Value Range Status   Specimen Description BLOOD LEFT HAND   Final   Special Requests BOTTLES DRAWN AEROBIC ONLY 10CC   Final   Culture  Setup Time     Final   Value: 08/13/2013 22:30     Performed at [facility]   Culture     Final   Value: NO GROWTH 5 DAYS     Performed at [facility]   Report Status 08/19/2013 FINAL   Final  CULTURE, BLOOD (ROUTINE X 2)     Status: None   Collection Time    08/13/13  3:00 PM      Result Value Range Status   Specimen Description BLOOD LEFT ARM   Final   Special Requests BOTTLES DRAWN AEROBIC AND ANAEROBIC 10CC   Final   Culture  Setup Time     Final   Value: 08/13/2013 22:31     Performed at [facility]   Culture     Final   Value: NO GROWTH 5 DAYS     Performed at [facility]   Report Status 08/19/2013 FINAL   Final  URINE CULTURE     Status: None   Collection Time    08/13/13  3:11 PM      Result Value Range Status   Specimen Description URINE, CLEAN CATCH   Final   Special Requests NONE   Final   Culture  Setup Time     Final   Value: 08/14/2013 06:01     Performed at [place]     Final   Value: NO GROWTH     Performed at [facility]   Culture     Final   Value: NO GROWTH     Performed at [facility]   Report Status 08/15/2013 FINAL   Final  SURGICAL PCR SCREEN     Status: None   Collection Time    08/14/13  4:35 PM      Result Value Range Status   MRSA, PCR NEGATIVE  NEGATIVE Final   Staphylococcus aureus NEGATIVE  NEGATIVE Final   Comment:            The Xpert SA Assay (FDA     approved for  NASAL specimens     in patients over 70 years of age),     is one component of     a comprehensive surveillance     program.  Test performance has     been validated by [employer] for patients greater     than or equal to 35 year old.     It is not intended     to diagnose infection nor to     guide or monitor treatment.  CULTURE, ROUTINE-ABSCESS     Status: None   Collection Time    08/14/13  6:28 PM      Result Value Range Status   Specimen Description ABDOMEN INCISION DRAINAGE   Final   Special Requests NONE   Final   Gram Stain     Final   Value: ABUNDANT WBC PRESENT, PREDOMINANTLY PMN     NO SQUAMOUS EPITHELIAL CELLS SEEN     MODERATE GRAM POSITIVE COCCI IN PAIRS     IN CLUSTERS     Performed at [facility]   Culture     Final   Value: RARE STAPHYLOCOCCUS SPECIES (COAGULASE NEGATIVE)  Performed at [facility]   Report Status 08/19/2013 FINAL   Final  ANAEROBIC CULTURE     Status: None   Collection Time    08/14/13  6:28 PM      Result Value Range Status   Specimen Description ABDOMEN INCISION DRAINAGE   Final   Special Requests NONE   Final   Gram Stain PENDING   Incomplete   Culture     Final   Value: NO ANAEROBES ISOLATED; CULTURE IN PROGRESS FOR 5 DAYS     Performed at [facility]   Report Status PENDING   Incomplete  CULTURE, BLOOD (ROUTINE X 2)     Status: None   Collection Time    08/15/13 12:40 AM      Result Value Range Status   Specimen Description BLOOD LEFT ARM   Final   Special Requests BOTTLES DRAWN AEROBIC AND ANAEROBIC 5CC   Final   Culture  Setup Time     Final   Value: 08/15/2013 13:18     Performed at [facility]   Culture     Final   Value:        BLOOD CULTURE RECEIVED NO GROWTH TO DATE CULTURE WILL BE HELD FOR 5 DAYS BEFORE ISSUING A FINAL NEGATIVE REPORT     Performed at [facility]   Report Status PENDING   Incomplete  CULTURE, BLOOD (ROUTINE X 2)     Status: None   Collection Time     08/15/13 12:50 AM      Result Value Range Status   Specimen Description BLOOD LEFT ARM   Final   Special Requests BOTTLES DRAWN AEROBIC AND ANAEROBIC 5CC   Final   Culture  Setup Time     Final   Value: 08/15/2013 13:18     Performed at [facility]   Culture     Final   Value:        BLOOD CULTURE RECEIVED NO GROWTH TO DATE CULTURE WILL BE HELD FOR 5 DAYS BEFORE ISSUING A FINAL NEGATIVE REPORT     Performed at [facility]   Report Status PENDING   Incomplete  URINE CULTURE     Status: None   Collection Time    08/15/13  2:37 AM      Result Value Range Status   Specimen Description URINE, CATHETERIZED   Final   Special Requests NONE   Final   Culture  Setup Time     Final   Value: 08/15/2013 16:08     Performed at [facility]   Culture     Final   Value: NO GROWTH     Performed at [facility]   Report Status 08/17/2013 FINAL   Final  CLOSTRIDIUM DIFFICILE BY PCR     Status: None   Collection Time    08/15/13  9:04 AM      Result Value Range Status   C difficile by pcr NEGATIVE  NEGATIVE Final   Comment: Performed at [facility]     Scheduled Meds: . acetaminophen  1,000 mg Oral TID  . atorvastatin  40 mg Oral QODAY  . enoxaparin (LOVENOX) injection  80 mg Subcutaneous Q24H  . furosemide  80 mg Oral BID  . insulin aspart  0-20 Units Subcutaneous Q4H  . insulin glargine  65 Units Subcutaneous QHS  . lip balm  1 application Topical BID  . OxyCODONE  10 mg Oral Q12H  .  piperacillin-tazobactam (ZOSYN)  IV  3.375 g Intravenous Q8H  . psyllium  1 packet Oral Daily  . saccharomyces boulardii  250 mg Oral BID  . sodium chloride  3 mL Intravenous Q12H  . vancomycin  2,000 mg Intravenous Q24H   Continuous Infusions: . sodium chloride 10 mL/hr (08/19/13 0010)  . [name] KitchenTPN (CLINIMIX-E) Adult 40 mL/hr at 08/18/13 2218   And  . fat emulsion 250 mL (08/18/13 2217)  . [name] KitchenTPN (CLINIMIX-E) Adult     And  . fat emulsion       Alicia Ackert,  DO  [facility] Pager [phone]  If 7PM-7AM, please contact night-coverage [email or web address] Password [email or web address] 08/19/2013, 5:27 PM   LOS: 8 days

## 2013-08-19 NOTE — Progress Notes (Signed)
Clinical Social Work  CSW met with patient and wife at bedside and had discussion regarding [place] vs SNF options. Patient wanting to return home at DC. Patient's wife aware of [facility] services and feels she is able to care for patient at home. CSW informed CM of patient's choice. CSW is signing off but available if further needs arise.  Sindy Messing, LCSW  (Coverage for [employer])

## 2013-08-19 NOTE — Progress Notes (Signed)
Delayed note entry from yesterday  Started having large drainage from abd No abd pain. No n/v.  EC fistula - make npo. Will see if wound care can try to pouch area to protect skin and measure output Will need picc/tpn F/u abd cultures from surgery  Appears to be small bowel fistula. Will need to see if high or low-output. If low output - may close on its own.   This is not an easy problem. Would not attempt to fix in OR right now because it will not work  [employer] Redmond Pulling, MD, FACS General, Bariatric, & Minimally Invasive Surgery [facility], [place]

## 2013-08-19 NOTE — Consult Note (Signed)
WOC wound follow up Fistula pouch intact.  Bedside nursing staff without questions this evening. [place] nursing team will follow along with you. Thanks, Maudie Flakes, MSN, RN, [place], [facility], [place] [phone])

## 2013-08-19 NOTE — Progress Notes (Signed)
Occupational Therapy Treatment Patient Details Name: Kurt Cross MRN: [id number] DOB: 06/20/1951 Today's Date: 08/19/2013 Time: [id number] OT Time Calculation (min): 36 min  OT Assessment / Plan / Recommendation  History of present illness Kurt Cross is a 62 y.o. male  with known history of chronic systolic heart failure status post defibrillator placement last EF measured was in 2013 was 35-40%, on home oxygen, OSA noncompliant with CPAP, diabetes mellitus, hypertension and hyperlipidemia presented to the ER because right leg pain after sustaining a fall the night prior to admission.  08/14/13: s/p abdominal abscess removal   OT comments  Pt continues to have increased pain with activity but tolerated up to wash and then perform functional transfer to chair. See PT notes also for ambulation. He is motivated to do what he can. Wife present in room.   Follow Up Recommendations  SNF;Supervision/Assistance - 24 hour    Barriers to Discharge       Equipment Recommendations  3 in 1 bedside comode (wide)    Recommendations for Other Services    Frequency Min 2X/week   Progress towards OT Goals Progress towards OT goals: Progressing toward goals  Plan Discharge plan remains appropriate    Precautions / Restrictions Precautions Precautions: Fall Precaution Comments: abdominal binder-soiled so discarder. Asked RN to order a new one.  Restrictions Weight Bearing Restrictions: No   Pertinent Vitals/Pain Pt reports "12/10" states he had pain meds, reposition, rest    ADL  Toilet Transfer: Simulated;+2 Total assistance Toilet Transfer: Patient Percentage: 80% Toilet Transfer Method: Stand pivot (up with PT for ambulation and then stand pivot around to chair afterwards) Toileting - Clothing Manipulation and Hygiene: Simulated;+1 Total assistance Where Assessed - Toileting Clothing Manipulation and Hygiene: Standing ADL Comments: Pt wanting to get washed up when PT/OT arrived. Pt  agreeable to sit up on EOB for washup. Wife more than willing to assist with bath. Encouraged pt to also do what he can with washing UB, etc to help maintain strength and he did wash underarms. Stood at EOB to wash periareas. Encouraged pt to try to let go of walker to assist with periarea hygiene but pt states he cant let go of walker yet. He stood for approximately a minute and a half while wife washed and therapy helping with standing support. Afterwards helped to chair but then pt wanting to get up and ambulate. Once back to room pivoted around to sit up in recliner. Pt did well and is motivated.     OT Diagnosis:    OT Problem List:   OT Treatment Interventions:     OT Goals(current goals can now be found in the care plan section)    Visit Information  Last OT Received On: 08/19/13 Assistance Needed: +2 History of Present Illness: Kurt Cross is a 62 y.o. male  with known history of chronic systolic heart failure status post defibrillator placement last EF measured was in 2013 was 35-40%, on home oxygen, OSA noncompliant with CPAP, diabetes mellitus, hypertension and hyperlipidemia presented to the ER because right leg pain after sustaining a fall the night prior to admission.  08/14/13: s/p abdominal abscess removal    Subjective Data      Prior Functioning       Cognition  Cognition Arousal/Alertness: Awake/alert Behavior During Therapy: WFL for tasks assessed/performed Overall Cognitive Status: Within Functional Limits for tasks assessed    Mobility   Supine to Sit: 3: Mod assist;HOB elevated;With rails Details for Bed Mobility Assistance:  Assist for trunk to full upright. Increased time. Pt uses momentum to get to EOB Transfers Transfers: Sit to Stand;Stand to Sit Sit to Stand: 3: Mod assist;From bed;From chair/3-in-1 Stand to Sit: 3: Mod assist;To chair/3-in-1 Details for Transfer Assistance: x 2. Assist to rise, stabilize, control descent. VCS safety, technique, hand  placement.     Exercises      Balance     End of Session OT - End of Session Equipment Utilized During Treatment: Rolling walker Activity Tolerance: Patient limited by pain Patient left: in chair;with call bell/phone within reach;with family/visitor present  GO     Jules Schick [id number] 08/19/2013, 1:04 PM

## 2013-08-19 NOTE — Progress Notes (Signed)
Pt has refused CPAP for the night, RT to monitor and assess as needed.  

## 2013-08-19 NOTE — Progress Notes (Addendum)
PARENTERAL NUTRITION CONSULT NOTE - FOLLOW UP  Pharmacy Consult for TNA Indication: Enterocutaneous fistula  No Known Allergies  Patient Measurements: Height: 6' (182.9 cm) Weight: 356 lb 4.2 oz (161.6 kg) IBW/kg (Calculated) : 77.6 Adjusted Body Weight: 102.6kg  Vital Signs: Temp: 98.5 F (36.9 C) (11/13 0611) Temp src: Oral (11/13 0611) BP: 118/60 mmHg (11/13 0611) Pulse Rate: 60 (11/13 0611) Intake/Output from previous day: 11/12 0701 - 11/13 0700 In: 1250.5 [P.O.:60; I.V.:68.7; IV Piggyback:700; TPN:421.8] Out: 2575 [Urine:2425; Drains:150] Intake/Output from this shift:    Labs:  Recent Labs  08/17/13 0325 08/18/13 0645 08/19/13 0435  WBC 12.3* 11.4* 11.2*  HGB 12.1* 12.4* 12.1*  HCT 36.7* 37.3* 36.7*  PLT 366 402* 392     Recent Labs  08/17/13 0325 08/18/13 0645 08/18/13 1500 08/19/13 0435  NA 134* 136  --  138  K 4.4 3.9  --  3.8  CL 99 101  --  101  CO2 26 28  --  30  GLUCOSE 202* 116*  --  103*  BUN 30* 18  --  14  CREATININE 1.48* 1.04  --  1.08  CALCIUM 9.1 9.0  --  9.2  MG  --   --   --  1.5  PHOS  --   --   --  3.9  PROT  --   --   --  6.8  ALBUMIN  --   --   --  2.0*  AST  --   --   --  36  ALT  --   --   --  21  ALKPHOS  --   --   --  37*  BILITOT  --   --   --  0.5  PREALBUMIN  --   --  9.2*  --    Estimated Creatinine Clearance: 114.4 ml/min (by C-G formula based on Cr of 1.08).    Recent Labs  08/18/13 2004 08/18/13 2356 08/19/13 0418  GLUCAP 100* 104* 115*    Medications:  Scheduled:  . acetaminophen  1,000 mg Oral TID  . atorvastatin  40 mg Oral QODAY  . enoxaparin (LOVENOX) injection  80 mg Subcutaneous Q24H  . furosemide  80 mg Oral BID  . insulin aspart  0-20 Units Subcutaneous Q4H  . insulin glargine  65 Units Subcutaneous QHS  . lip balm  1 application Topical BID  . piperacillin-tazobactam (ZOSYN)  IV  3.375 g Intravenous Q8H  . psyllium  1 packet Oral Daily  . saccharomyces boulardii  250 mg Oral BID  .  sodium chloride  3 mL Intravenous Q12H  . vancomycin  2,000 mg Intravenous Q24H   Infusions:  . sodium chloride 10 mL/hr (08/19/13 0010)  . [name] KitchenTPN (CLINIMIX-E) Adult 40 mL/hr at 08/18/13 2218   And  . fat emulsion 250 mL (08/18/13 2217)    Insulin Requirements in the past 24 hours:  Lantus 65units sq qHS (increasing doses since admit) No SSI required overnight - CBG q4h with resistant scale coverage  Nutritional Goals:  RD recs pending  Current Nutrition:  NPO Clinimix E5/15 at 40 ml/hr and 20% lipids at 39ml/hr started last night ~10p  IVF: NS at 21ml/hr  Assessment: 62 yo male with large abdominal abscess with infected old Gore Tex mesh s/p I&D abscess, removal of mesh, debridement of abdominal wall and peritoneum. 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 TNA started 11/12 at 10pm.   Glucose: Hx DM on insulin PTA, CBG currently controlled  Electrolytes: all wnl  LFTs: wnl, albumin low (2.0)  TGs: pending  Prealbumin: pending  TPN Access: Triple lumen PICC placed 11/12 TPN day#: 2  Plan: At 1800 today:  Increase Clinimix E5/15 to 73ml/hr.  20% fat emulsion at 59ml/hr.  Plan to advance as tolerated to the goal rate.  TNA to contain standard multivitamins and trace elements.  IVF per MD  TNA lab panels on Mondays & Thursdays.  F/u daily.   Peggyann Juba, PharmD, BCPS Pager: [phone] 08/19/2013,7:23 AM

## 2013-08-19 NOTE — Progress Notes (Signed)
5 Days Post-Op  Subjective: Pt says he's "about the same", c/o pain at abdominal wound, no N/V, hungry, but NPO, tolerating ice chips.  Currently limited in is mobility due to open abdomen and new EC fistula.    Objective: Vital signs in last 24 hours: Temp:  [97.6 F (36.4 C)-98.5 F (36.9 C)] 98.5 F (36.9 C) (11/13 0611) Pulse Rate:  [60-75] 60 (11/13 0611) Resp:  [18] 18 (11/13 0611) BP: (118-154)/(59-73) 118/60 mmHg (11/13 0611) SpO2:  [97 %-100 %] 100 % (11/13 0611) Weight:  [356 lb 4.2 oz (161.6 kg)] 356 lb 4.2 oz (161.6 kg) (11/13 0611) Last BM Date: 08/15/13  Intake/Output from previous day: 11/12 0701 - 11/13 0700 In: 1250.5 [P.O.:60; I.V.:68.7; IV Piggyback:700; TPN:421.8] Out: 2575 [Urine:2425; Drains:150] Intake/Output this shift: Total I/O In: -  Out: 450 [Urine:450]  PE: Gen:  Alert, NAD, irritated, reluctantly answers questions Card:  RRR, no M/G/R heard Pulm:  CTA, no W/R/R Abd: Soft, mild tenderness ND, +BS, open abdomen with EC pouch (clear [place]) over entire wound, visible wound is pink/red with some visible slough.  Wound gauze soaked with brownish green mostly clear fluid with some sediment collected in the bag.  Some periwound erythema and induration reaching the left most part of the abdomen, ice packs being applied    Lab Results:   Recent Labs  08/18/13 0645 08/19/13 0435  WBC 11.4* 11.2*  HGB 12.4* 12.1*  HCT 37.3* 36.7*  PLT 402* 392   BMET  Recent Labs  08/18/13 0645 08/19/13 0435  NA 136 138  K 3.9 3.8  CL 101 101  CO2 28 30  GLUCOSE 116* 103*  BUN 18 14  CREATININE 1.04 1.08  CALCIUM 9.0 9.2   PT/INR No results found for this basename: LABPROT, INR,  in the last 72 hours CMP     Component Value Date/Time   NA 138 08/19/2013 0435   K 3.8 08/19/2013 0435   CL 101 08/19/2013 0435   CO2 30 08/19/2013 0435   GLUCOSE 103* 08/19/2013 0435   BUN 14 08/19/2013 0435   CREATININE 1.08 08/19/2013 0435   CALCIUM 9.2 08/19/2013  0435   PROT 6.8 08/19/2013 0435   ALBUMIN 2.0* 08/19/2013 0435   AST 36 08/19/2013 0435   ALT 21 08/19/2013 0435   ALKPHOS 37* 08/19/2013 0435   BILITOT 0.5 08/19/2013 0435   GFRNONAA 73* 08/19/2013 0435   GFRAA 84* 08/19/2013 0435   Lipase  No results found for this basename: lipase       Studies/Results: Dg Chest Port 1 View  08/18/2013   CLINICAL DATA:  Confirm line placement  EXAM: PORTABLE CHEST - 1 VIEW  COMPARISON:  08/17/2013  FINDINGS: Two images were taken. The patient is in semi-upright position and is slightly rotated to the right. A right upper extremity PICC has been placed, the distal tip of which is in the mid superior vena cava. Left chest wall triple lead pacemaker appears stable. Cardiomegaly. There is diffuse pulmonary vascular congestion and similar appearance of pulmonary edema. No pneumothorax or pleural effusion is appreciated.  IMPRESSION: 1. Right upper extremity PICC terminates in the mid superior vena cava. 2. Stable congestive heart failure pattern.   Electronically Signed   By: Curlene Dolphin M.D.   On: 08/18/2013 21:39    Anti-infectives: Anti-infectives   Start     Dose/Rate Route Frequency Ordered Stop   08/16/13 0800  vancomycin (VANCOCIN) 2,000 mg in sodium chloride 0.9 % 500 mL IVPB  2,000 mg 250 mL/hr over 120 Minutes Intravenous Every 24 hours 08/15/13 1411     08/15/13 1500  vancomycin (VANCOCIN) 2,000 mg in sodium chloride 0.9 % 500 mL IVPB     2,000 mg 250 mL/hr over 120 Minutes Intravenous  Once 08/15/13 1411 08/15/13 1713   08/14/13 1600  piperacillin-tazobactam (ZOSYN) IVPB 3.375 g     3.375 g 12.5 mL/hr over 240 Minutes Intravenous Every 8 hours 08/14/13 1543         Assessment/Plan Giant abdominal abscess with infected old GoreTex mesh POD #5 s/p I&D abscess, removal of mesh, debridement of abdominal wall & peritoneum  New small bowel EC fistula  Sepsis 2* above - resolving, WBC trending down at 12.3  Day 5 antibiotics day 5  Zosyn, day 4 Vancomycin, being followed by ID/plan empiric treatment for 14 days.  Acute on chronic kidney disease Stage III - Cr. down to 1.48  H/o OSA, DM, HTN, HLD, CHF, AAA  Protein calorie malnutrition/inability to take PO  Plan: 1.  NPO/bowel rest, PICC, TPN 2.  Pain meds, antiemetics, antibiotics (vanc/zosyn Day 6) 3.  Dressing changes BID - WOC RN on board helping with EC pouch to wound 4.  Drain measured 114mL out, but the bag likely has about 243mL in there right now, unsure if this amount was accounted for in the I&O.   5.  If high output fistula would not recommend surgery now, but potentially definitive repair could be done at a later time, if low output fistula may close on own 6.  Continue to monitor closely 7.  Not ready for Xeralto to be restarted, cont lovenox or hepairn    LOS: 8 days    DORT, Deklyn Trachtenberg 08/19/2013, 10:16 AM Pager: [phone]

## 2013-08-19 NOTE — Progress Notes (Addendum)
INITIAL NUTRITION ASSESSMENT  DOCUMENTATION CODES Per approved criteria  -Morbid Obesity   INTERVENTION: TPN per Pharmacy, see energy/protein recommendations below Diet advancement per MD RD to continue to monitor nutrition care plan  NUTRITION DIAGNOSIS: Inadequate oral intake related to EC fistula as evidenced by NPO status.   Goal: Pt to meet >/= 90% of their estimated nutrition needs   Monitor:  TPN rate Weight trends Labs Diet advancement  Reason for Assessment: Consult for new TPN  62 y.o. male  Admitting Dx: Infected prosthetic mesh of abdominal wall  ASSESSMENT: 62 y.o. male with known history of chronic systolic heart failure status post defibrillator placement last EF measured was in 2013 was 35-40%, on home oxygen, OSA noncompliant with CPAP, diabetes mellitus, hypertension and hyperlipidemia presented to the ER because right leg pain after sustaining a fall the night prior to admission. He was noted to be febrile with some abdominal wall swelling. CT scan revealed a large abdominal wall abscess. POD#2 from drainage of 3 L of pus and removal of his old Gore-Tex mesh. 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 TNA started 11/12 at 10pm. Pt reports decreased appetite but, per nursing notes pt was eating 100% of meals before being made NPO. Pt states that he weighed 268 lbs prior to previous hospitalization.  Current TPN: Clinimix E5/15 running at 40 ml/hr.    Height: Ht Readings from Last 1 Encounters:  08/14/13 6' (1.829 m)    Weight: Wt Readings from Last 1 Encounters:  08/19/13 356 lb 4.2 oz (161.6 kg)    Ideal Body Weight: 178 lbs  % Ideal Body Weight: 200%  Wt Readings from Last 10 Encounters:  08/19/13 356 lb 4.2 oz (161.6 kg)  08/19/13 356 lb 4.2 oz (161.6 kg)  08/19/13 356 lb 4.2 oz (161.6 kg)  08/05/13 351 lb 6.6 oz (159.4 kg)  08/27/12 343 lb 7.6 oz (155.8 kg)  06/29/12 341 lb 12.8 oz (155.039 kg)  04/28/08 333 lb 2.1 oz (151.107 kg)     Usual Body Weight: 368 lbs per pt  % Usual Body Weight: 97%  BMI:  Body mass index is 48.31 kg/(m^2). (morbidly obese)  Estimated Nutritional Needs: Kcal: 2800-3000 Protein: 160-180 grams Fluid: 3.3-3.6 L/day  Skin: non-pitting RLE and LLE edema  Diet Order: NPO  EDUCATION NEEDS: -No education needs identified at this time   Intake/Output Summary (Last 24 hours) at 08/19/13 1152 Last data filed at 08/19/13 1000  Gross per 24 hour  Intake 1247.5 ml  Output   2375 ml  Net -1127.5 ml    Last BM: 11/11  Labs:   Recent Labs Lab 08/16/13 0342 08/17/13 0325 08/18/13 0645 08/19/13 0435  NA 132* 134* 136 138  K 4.1 4.4 3.9 3.8  CL 97 99 101 101  CO2 26 26 28 30   BUN 40* 30* 18 14  CREATININE 2.03* 1.48* 1.04 1.08  CALCIUM 8.9 9.1 9.0 9.2  MG 1.9  --   --  1.5  PHOS  --   --   --  3.9  GLUCOSE 214* 202* 116* 103*    CBG (last 3)   Recent Labs  08/18/13 2356 08/19/13 0418 08/19/13 0736  GLUCAP 104* 115* 121*    Scheduled Meds: . acetaminophen  1,000 mg Oral TID  . atorvastatin  40 mg Oral QODAY  . enoxaparin (LOVENOX) injection  80 mg Subcutaneous Q24H  . furosemide  80 mg Oral BID  . insulin aspart  0-20 Units Subcutaneous Q4H  .  insulin glargine  65 Units Subcutaneous QHS  . lip balm  1 application Topical BID  . piperacillin-tazobactam (ZOSYN)  IV  3.375 g Intravenous Q8H  . psyllium  1 packet Oral Daily  . saccharomyces boulardii  250 mg Oral BID  . sodium chloride  3 mL Intravenous Q12H  . vancomycin  2,000 mg Intravenous Q24H    Continuous Infusions: . sodium chloride 10 mL/hr (08/19/13 0010)  . [name] KitchenTPN (CLINIMIX-E) Adult 40 mL/hr at 08/18/13 2218   And  . fat emulsion 250 mL (08/18/13 2217)  . [name] KitchenTPN (CLINIMIX-E) Adult     And  . fat emulsion      Past Medical History  Diagnosis Date  . Sleep apnea   . Diabetes mellitus   . Hypertension   . Hyperlipidemia   . CHF (congestive heart failure)   . Hernia   . AAA (abdominal aortic  aneurysm)/ 4.5 cm ascending per CT angio 08/01/13 08/12/2013  . Infected prosthetic mesh of abdominal wall with GIANT abscess s/p removal 08/14/2013 08/14/2013    Past Surgical History  Procedure Laterality Date  . Appendectomy    . Tonsillectomy    . Nasal septum surgery    . Bowel resection  1980s?    ?colectomy/ostomy - Dr Elesa Hacker  . Ostomy take down  1980s?    Dr Elesa Hacker  . Incisional hernia repair  1980s?    Dr. Lindon Romp - w mesh  . Knee surgery Right   . Incision and drainage abscess N/A 08/14/2013    Procedure: INCISION AND DRAINAGE ABSCESS, mesh ;  Surgeon: [name], MD;  Location: [facility];  Service: General;  Laterality: N/A;    Pryor Ochoa RD, LDN Inpatient Clinical Dietitian Pager: [phone] After Hours Pager: [phone]

## 2013-08-19 NOTE — Progress Notes (Signed)
No complaints  Open abd wound pouched. Enteric contents doesn't appear to be pooling underneath skin flaps  Output around 150cc since pouched  S/p removal of infected goretex mesh Open frozen abdomen EC fistula PCMN  Cont bowel rest, TPN 1st goal is to try to make sure we have the fistula adequately controlled and pouched to prevent surrounding skin breakdown - so far so good 2nd goal is to try to quantate the amount of drainage from the fistula. If low volume there is a chance it may closure on own  This is very complex problem with no quick fix and no short term solution.  Goal is to control fistula and let surrounding wound granulate in.   Leighton Ruff. Redmond Pulling, MD, FACS General, Bariatric, & Minimally Invasive Surgery [facility], [place]

## 2013-08-19 NOTE — Progress Notes (Signed)
ANTICOAGULATION CONSULT NOTE - Initial Consult  Pharmacy Consult for IV heparin  Indication: atrial fibrillation  No Known Allergies  Patient Measurements: Height: 6' (182.9 cm) Weight: 356 lb 4.2 oz (161.6 kg) IBW/kg (Calculated) : 77.6  Vital Signs: Temp: 98 F (36.7 C) (11/13 1405) Temp src: Oral (11/13 1405) BP: 147/82 mmHg (11/13 1405) Pulse Rate: 60 (11/13 1405)  Labs:  Recent Labs  08/17/13 0325 08/18/13 0645 08/19/13 0435  HGB 12.1* 12.4* 12.1*  HCT 36.7* 37.3* 36.7*  PLT 366 402* 392  CREATININE 1.48* 1.04 1.08    Estimated Creatinine Clearance: 114.4 ml/min (by C-G formula based on Cr of 1.08).   Medical History: Past Medical History  Diagnosis Date  . Sleep apnea   . Diabetes mellitus   . Hypertension   . Hyperlipidemia   . CHF (congestive heart failure)   . Hernia   . AAA (abdominal aortic aneurysm)/ 4.5 cm ascending per CT angio 08/01/13 08/12/2013  . Infected prosthetic mesh of abdominal wall with GIANT abscess s/p removal 08/14/2013 08/14/2013    Medications:  Scheduled:  . acetaminophen  1,000 mg Oral TID  . atorvastatin  40 mg Oral QODAY  . [START ON 08/20/2013] carvedilol  3.125 mg Oral BID WC  . furosemide  80 mg Oral BID  . insulin aspart  0-20 Units Subcutaneous Q4H  . insulin glargine  65 Units Subcutaneous QHS  . lip balm  1 application Topical BID  . OxyCODONE  10 mg Oral Q12H  . piperacillin-tazobactam (ZOSYN)  IV  3.375 g Intravenous Q8H  . psyllium  1 packet Oral Daily  . saccharomyces boulardii  250 mg Oral BID  . sodium chloride  3 mL Intravenous Q12H  . vancomycin  2,000 mg Intravenous Q24H   Infusions:  . sodium chloride 10 mL/hr (08/19/13 0010)  . [name] KitchenTPN (CLINIMIX-E) Adult 50 mL/hr at 08/19/13 1747   And  . fat emulsion 250 mL (08/19/13 1747)    Assessment: 62 yo male s/p surgery for infected prosthetic Gore-Tex mesh removal with abscess I&D now on IV abx's. Apixiban has been on hold due to surgery but now going to  start IV heparin per pharmacy for current Afib until surgery clears patient to restart apixiban. No reported bleeding. Stable CBC. Note patient received 80mg  of prophylactic Lovenox (dose adjusted to 0.5mg /kg due to obesity) at 1225.  Goal of Therapy:  Heparin level 0.3-0.7 units/ml Monitor platelets by anticoagulation protocol: Yes   Plan:  1) No IV heparin bolus with Lovenox given earlier in the day 2) IV heparin 1600 units/hr 3) check heparin level 6 hours after start of heparin 4) Daily CBC and heparin level   Adrian Saran, PharmD, BCPS Pager [phone] 08/19/2013 8:11 PM

## 2013-08-20 DIAGNOSIS — E1065 Type 1 diabetes mellitus with hyperglycemia: Secondary | ICD-10-CM

## 2013-08-20 LAB — BASIC METABOLIC PANEL
CO2: 34 mEq/L — ABNORMAL HIGH (ref 19–32)
Chloride: 99 mEq/L (ref 96–112)
GFR calc non Af Amer: 74 mL/min — ABNORMAL LOW (ref >90)
Glucose, Bld: 107 mg/dL — ABNORMAL HIGH (ref 70–99)
Sodium: 140 mEq/L (ref 135–145)

## 2013-08-20 LAB — GLUCOSE, CAPILLARY
Glucose-Capillary: 109 mg/dL — ABNORMAL HIGH (ref 70–99)
Glucose-Capillary: 126 mg/dL — ABNORMAL HIGH (ref 70–99)
Glucose-Capillary: 103 mg/dL — ABNORMAL HIGH (ref 70–99)
Glucose-Capillary: 104 mg/dL — ABNORMAL HIGH (ref 70–99)
Glucose-Capillary: 129 mg/dL — ABNORMAL HIGH (ref 70–99)
Glucose-Capillary: 110 mg/dL — ABNORMAL HIGH (ref 70–99)

## 2013-08-20 LAB — ANAEROBIC CULTURE

## 2013-08-20 LAB — CBC
HCT: 36 % — ABNORMAL LOW (ref 39.0–52.0)
Hemoglobin: 11.9 g/dL — ABNORMAL LOW (ref 13.0–17.0)
Platelets: 374 10*3/uL (ref 150–400)
RBC: 3.68 MIL/uL — ABNORMAL LOW (ref 4.22–5.81)
WBC: 10.3 10*3/uL (ref 4.0–10.5)

## 2013-08-20 LAB — MAGNESIUM: Magnesium: 1.4 mg/dL — ABNORMAL LOW (ref 1.5–2.5)

## 2013-08-20 LAB — PHOSPHORUS: Phosphorus: 3.7 mg/dL (ref 2.3–4.6)

## 2013-08-20 LAB — HEPARIN LEVEL (UNFRACTIONATED)
Heparin Unfractionated: 0.22 IU/mL — ABNORMAL LOW (ref 0.30–0.70)
Heparin Unfractionated: 0.22 IU/mL — ABNORMAL LOW (ref 0.30–0.70)

## 2013-08-20 MED ORDER — FAT EMULSION 20 % IV EMUL
250.0000 mL | INTRAVENOUS | Status: AC
  Administered 2013-08-20: 17:00:00 250 mL via INTRAVENOUS
  Filled 2013-08-20: qty 250

## 2013-08-20 MED ORDER — HEPARIN (PORCINE) IN NACL 100-0.45 UNIT/ML-% IJ SOLN
2200.0000 [IU]/h | INTRAMUSCULAR | Status: DC
  Administered 2013-08-20: 13:00:00 2200 [IU]/h via INTRAVENOUS
  Filled 2013-08-20 (×4): qty 250

## 2013-08-20 MED ORDER — MAGNESIUM SULFATE 40 MG/ML IJ SOLN
2.0000 g | Freq: Once | INTRAMUSCULAR | Status: AC
  Administered 2013-08-20: 2 g via INTRAVENOUS
  Filled 2013-08-20: qty 50

## 2013-08-20 MED ORDER — HEPARIN (PORCINE) IN NACL 100-0.45 UNIT/ML-% IJ SOLN
1800.0000 [IU]/h | INTRAMUSCULAR | Status: DC
  Administered 2013-08-20: 05:00:00 1800 [IU]/h via INTRAVENOUS
  Filled 2013-08-20: qty 250

## 2013-08-20 MED ORDER — HEPARIN BOLUS VIA INFUSION
2000.0000 [IU] | Freq: Once | INTRAVENOUS | Status: AC
  Administered 2013-08-20: 2000 [IU] via INTRAVENOUS
  Filled 2013-08-20: qty 2000

## 2013-08-20 MED ORDER — POTASSIUM CHLORIDE 10 MEQ/50ML IV SOLN
10.0000 meq | INTRAVENOUS | Status: AC
  Administered 2013-08-20 (×3): 10 meq via INTRAVENOUS
  Filled 2013-08-20 (×3): qty 50

## 2013-08-20 MED ORDER — MAGNESIUM SULFATE 40 MG/ML IJ SOLN
2.0000 g | Freq: Once | INTRAMUSCULAR | Status: DC
  Filled 2013-08-20: qty 50

## 2013-08-20 MED ORDER — TRACE MINERALS CR-CU-F-FE-I-MN-MO-SE-ZN IV SOLN
INTRAVENOUS | Status: AC
  Administered 2013-08-20: 17:00:00 via INTRAVENOUS
  Filled 2013-08-20: qty 2000

## 2013-08-20 MED ORDER — HEPARIN (PORCINE) IN NACL 100-0.45 UNIT/ML-% IJ SOLN
2400.0000 [IU]/h | INTRAMUSCULAR | Status: DC
  Administered 2013-08-20 – 2013-08-21 (×2): 2400 [IU]/h via INTRAVENOUS
  Filled 2013-08-20 (×2): qty 250

## 2013-08-20 NOTE — Progress Notes (Signed)
Patient refused CPAP for the night. Patient on 2lpm with Sp02=97%.

## 2013-08-20 NOTE — Progress Notes (Signed)
6 Days Post-Op  Subjective: Pt's pain about the same.  No N/V.  +flatus and last BM on 08/17/13.  Mobilizing with PT/OT who are recommending SNF vs 24 hr supervision.  NPO on TPN.    Objective: Vital signs in last 24 hours: Temp:  [97.4 F (36.3 C)-98 F (36.7 C)] 97.4 F (36.3 C) (11/14 0434) Pulse Rate:  [59-62] 59 (11/14 0434) Resp:  [16-24] 16 (11/14 0434) BP: (131-147)/(62-82) 135/62 mmHg (11/14 0434) SpO2:  [99 %-100 %] 100 % (11/14 0434) Weight:  [342 lb 13 oz (155.5 kg)] 342 lb 13 oz (155.5 kg) (11/14 0434) Last BM Date: 08/17/13  Intake/Output from previous day: 11/13 0701 - 11/14 0700 In: 1281.7 [I.V.:113.2; IV Piggyback:600; TPN:568.5] Out: 2025 [Urine:1525; Drains:500] Intake/Output this shift: Total I/O In: -  Out: 300 [Urine:200; Drains:100]  PE: Gen:  Alert, NAD, pleasant Abd: Soft, mild tenderness ND, +BS, open abdomen with EC pouch (clear [place]) over entire wound, visible wound is pink/red with some visible slough. Wound gauze soaked with brownish green mostly clear fluid with some sediment collected in the bag.  Gauze has slid out of the wound and down to the bottom of the bag.  Some periwound erythema and induration reaching the left most part of the abdomen.   Lab Results:   Recent Labs  08/19/13 0435 08/20/13 0528  WBC 11.2* 10.3  HGB 12.1* 11.9*  HCT 36.7* 36.0*  PLT 392 374   BMET  Recent Labs  08/19/13 0435 08/20/13 0528  NA 138 140  K 3.8 3.5  CL 101 99  CO2 30 34*  GLUCOSE 103* 107*  BUN 14 12  CREATININE 1.08 1.06  CALCIUM 9.2 8.9   PT/INR No results found for this basename: LABPROT, INR,  in the last 72 hours CMP     Component Value Date/Time   NA 140 08/20/2013 0528   K 3.5 08/20/2013 0528   CL 99 08/20/2013 0528   CO2 34* 08/20/2013 0528   GLUCOSE 107* 08/20/2013 0528   BUN 12 08/20/2013 0528   CREATININE 1.06 08/20/2013 0528   CALCIUM 8.9 08/20/2013 0528   PROT 6.8 08/19/2013 0435   ALBUMIN 2.0* 08/19/2013 0435   AST 36 08/19/2013 0435   ALT 21 08/19/2013 0435   ALKPHOS 37* 08/19/2013 0435   BILITOT 0.5 08/19/2013 0435   GFRNONAA 74* 08/20/2013 0528   GFRAA 86* 08/20/2013 0528   Lipase  No results found for this basename: lipase       Studies/Results: Dg Chest Port 1 View  08/18/2013   CLINICAL DATA:  Confirm line placement  EXAM: PORTABLE CHEST - 1 VIEW  COMPARISON:  08/17/2013  FINDINGS: Two images were taken. The patient is in semi-upright position and is slightly rotated to the right. A right upper extremity PICC has been placed, the distal tip of which is in the mid superior vena cava. Left chest wall triple lead pacemaker appears stable. Cardiomegaly. There is diffuse pulmonary vascular congestion and similar appearance of pulmonary edema. No pneumothorax or pleural effusion is appreciated.  IMPRESSION: 1. Right upper extremity PICC terminates in the mid superior vena cava. 2. Stable congestive heart failure pattern.   Electronically Signed   By: Curlene Dolphin M.D.   On: 08/18/2013 21:39    Anti-infectives: Anti-infectives   Start     Dose/Rate Route Frequency Ordered Stop   08/16/13 0800  vancomycin (VANCOCIN) 2,000 mg in sodium chloride 0.9 % 500 mL IVPB     2,000 mg 250 mL/hr  over 120 Minutes Intravenous Every 24 hours 08/15/13 1411     08/15/13 1500  vancomycin (VANCOCIN) 2,000 mg in sodium chloride 0.9 % 500 mL IVPB     2,000 mg 250 mL/hr over 120 Minutes Intravenous  Once 08/15/13 1411 08/15/13 1713   08/14/13 1600  piperacillin-tazobactam (ZOSYN) IVPB 3.375 g     3.375 g 12.5 mL/hr over 240 Minutes Intravenous Every 8 hours 08/14/13 1543         Assessment/Plan Giant abdominal abscess with infected old GoreTex mesh POD #5 s/p I&D abscess, removal of mesh, debridement of abdominal wall & peritoneum  New small bowel EC fistula  Sepsis 2* above - resolving, WBC normal at 10.3 Day 7 antibiotics day 7 Zosyn, day 6 Vancomycin, being followed by ID/plan empiric treatment for 14  days.  Acute on chronic kidney disease Stage III - Cr normalized at 1.06 H/o OSA, DM, HTN, HLD, CHF, AAA  Protein calorie malnutrition/inability to take PO   Plan:  1. NPO/bowel rest, PICC, TPN  2. Pain meds, antiemetics, antibiotics (vanc/zosyn)  3. Dressing changes BID - WOC RN on board helping with EC pouch to wound  4. Drain measured 538mL out, but the bag likely has about 124mL in there right now 5. Output is increasing, he would not be a good surgical candidate at this point.  He may at some point need definitive surgery, but doing this now would not be feasible.  Will try conservative treatment to see if this will heal on its own. 6. Continue to monitor closely.  Need to work with [place] to try and manage the drainage and prevent it from pooling under the undermined skin flaps to all sides of the wound. 7. Not ready for Xeralto to be restarted, cont lovenox or heparin 8. Wound not recommend discharge quite yet.       LOS: 9 days    Cross, Kurt Dockendorf 08/20/2013, 10:08 AM Pager: [phone]

## 2013-08-20 NOTE — Consult Note (Signed)
[place] nurse to bedside around 1300 to discuss options with Dr. Redmond Pulling and Coralie Keens PA  from surgery.   1. We discussed using different type of fistula pouch with suction inside the pouch at all times to keep enteric drainage from pooling in the undermined areas that are most distal in the wound. Also different placement of the catheter either near the fistula or down in the skin flap, but we agreed placement near the fistula would be optimal. 2. Packing the wound with moist guaze, then placement of suction into the wound, draped with film dressing to LSW. 3. Placement of Mepitel over the maximum amount of exposed bowel/wound bed, white Versafoam in the undermined areas and over the open wound to VAC machine at 61mmHG up to a maximum of 36mmHG.   We opted for number 3, however when VAC machine and Mepitel arrived to the floor materials contacted the floor and reported that [facility] did not carry white Versafoam and it would Monday before this would arrive.  We then opted to use option 1 in addition adding Mepitel over the wound.    I cut new fistula pouch to fit, exposing the open wound, placed 3 entire sheets(8x 11) over the wound and tucked down in the undermined areas.  Bedside nursing assisted with LWS as needed through the placement, when placing the Mepitel into the undermined areas there was noted quite a bit of pooled enteric drainage. After entire area was covered, placed fistula pouch down on abdomen, window placed over fistula pouch opening.  48F red robinson catheter introduced from the top of the pouch via small cut in the pouch and fed down to lie over the Mepitel and just medial to the known fistula site.  LWS applied at 40-50 mmHG. Some bleeding noted at the base where the tissue was manipulated to place the Mepitel down in the undermined areas.  Over 1/2 of each sheet of Mepitel left out of undermining to allow for easy retrieval.  Suction working well.  Orders written to monitor catheter placement  at lease every 4 hours.  Coralie Keens PA at bedside with PA student agreed placement and suction appeared to working well and  Appropriate.  Bedside nurse also aware of appropriate placement.    [place] team will continue to follow with surgery to assist with management of this complicated wound and fistula.  Will have white foam delivered to the room for use on Monday should decision be to initiate this therapy then.    Lev Cervone Plover RN,CWOCN [id number]

## 2013-08-20 NOTE — Progress Notes (Signed)
[facility]    Date of Admission:  08/11/2013   Total days of antibiotics 7        Day 7 piptazo        Day 6 vanco           ID: Kurt Cross is a 62 y.o. male  with a remote history of bowel perforation requiring exploratory laparotomy and temporary colostomy. He then developed a ventral hernia and underwent mesh repair decades ago. He was recently hospitalized and treated for possible pneumonia. He had a fall at home recently and injured his right lower leg leading to readmission. He was noted to be febrile with some abdominal wall swelling. CT scan revealed a large abdominal wall abscess. POD#3 from drainage of 3 L of pus and removal of his old Gore-Tex mesh; abdominal wound now has ECF.  Principal Problem:   Infected prosthetic mesh of abdominal wall with GIANT abscess s/p removal 08/14/2013 Active Problems:   Diabetes mellitus, insulin dependent (IDDM), uncontrolled   HYPERLIPIDEMIA   Obesity, Class III, BMI 40-49.9 (morbid obesity)   OBSTRUCTIVE SLEEP APNEA   Hypertension   Acute-on-chronic respiratory failure secondary to probable community-acquired pneumonia   Fever   Sepsis   Constipation, chronic   Chronic combined systolic and diastolic CHF (congestive heart failure)   Right leg pain   Leukocytosis   AAA (abdominal aortic aneurysm)/ 4.5 cm ascending per CT angio 08/01/13   Recurrent ventral incisional hernia   Acute renal failure (ARF)    Subjective: Afebrile  Medications:  . acetaminophen  1,000 mg Oral TID  . atorvastatin  40 mg Oral QODAY  . carvedilol  3.125 mg Oral BID WC  . furosemide  80 mg Oral BID  . insulin aspart  0-20 Units Subcutaneous Q4H  . insulin glargine  65 Units Subcutaneous QHS  . lip balm  1 application Topical BID  . OxyCODONE  10 mg Oral Q12H  . piperacillin-tazobactam (ZOSYN)  IV  3.375 g Intravenous Q8H  . psyllium  1 packet Oral Daily  . saccharomyces boulardii  250 mg Oral BID  . sodium chloride  3 mL  Intravenous Q12H  . vancomycin  2,000 mg Intravenous Q24H    Objective: Vital signs in last 24 hours: Temp:  [97.4 F (36.3 C)-98.2 F (36.8 C)] 98.2 F (36.8 C) (11/14 1318) Pulse Rate:  [59-62] 60 (11/14 1318) Resp:  [16-24] 16 (11/14 1318) BP: (131-152)/(62-81) 152/81 mmHg (11/14 1318) SpO2:  [98 %-100 %] 98 % (11/14 1318) Weight:  [342 lb 13 oz (155.5 kg)] 342 lb 13 oz (155.5 kg) (11/14 0434)  General: Alert and comfortable talking on the telephone  Skin: No rash, splitter or conjunctival hemorrhages  Lungs: Clear  Cor: Regular S1 and S2 with no murmurs  Abdomen: Obese and distended.  dressing in place   Lab Results  Recent Labs  08/19/13 0435 08/20/13 0528  WBC 11.2* 10.3  HGB 12.1* 11.9*  HCT 36.7* 36.0*  NA 138 140  K 3.8 3.5  CL 101 99  CO2 30 34*  BUN 14 12  CREATININE 1.08 1.06   Liver Panel  Recent Labs  08/19/13 0435  PROT 6.8  ALBUMIN 2.0*  AST 36  ALT 21  ALKPHOS 37*  BILITOT 0.5    Microbiology: 11/9 wound cx: NGTD, gram stain GPC in prs and clusters, few CoNS 11/9 blood cx: NGTD  Studies/Results: Dg Chest Port 1 View  08/18/2013   CLINICAL DATA:  Confirm line placement  EXAM: PORTABLE CHEST - 1 VIEW  COMPARISON:  08/17/2013  FINDINGS: Two images were taken. The patient is in semi-upright position and is slightly rotated to the right. A right upper extremity PICC has been placed, the distal tip of which is in the mid superior vena cava. Left chest wall triple lead pacemaker appears stable. Cardiomegaly. There is diffuse pulmonary vascular congestion and similar appearance of pulmonary edema. No pneumothorax or pleural effusion is appreciated.  IMPRESSION: 1. Right upper extremity PICC terminates in the mid superior vena cava. 2. Stable congestive heart failure pattern.   Electronically Signed   By: Curlene Dolphin M.D.   On: 08/18/2013 21:39     Assessment/Plan: Abdominal abscess involving abdominal mesh since removed, now complicated by  possible EC fistula - continue with broad spectrum antibiotics. Cultures shows few CoNS.  -  Will  continue with vanco and pip/tazo for at least 2 -3 wks. Recommend to repeat imaging prior to discontinuation of antibiotics - defer to surgery to continue parental nutrition to contain/heal EC fistula  Kurt Cross, [facility] Cell: [phone] Pager: [phone]  08/20/2013, 4:47 PM

## 2013-08-20 NOTE — Progress Notes (Signed)
IV heparin ordered for patient. Pt currently has TPN and antibiotics going through triple lumen PICC. Un-used lumen was being used for lab draws. Paged IV team and they said a peripheral should be placed for IV heparin so that lab draws would be accurate. Patient refused to be stuck for a peripheral IV. Paged NP on call and informed her. Hung IV heparin on unused lumen. Pharmacy and NP aware. Will continue to monitor.

## 2013-08-20 NOTE — Progress Notes (Addendum)
[facility] PROGRESS NOTE  Kurt Cross [id number] DOB: 1950/12/29 DOA: 08/11/2013 PCP: [name], MD  Assessment/Plan: #1 infected prosthetic Gore-Tex mesh with a giant abdominal abscess status post IandD and Gore-Tex mesh removal 08/14/2013  Patient is status post incision and drainage of abscess, Gore-Tex mesh removal, debridement of abdominal wall and peritoneum 08/14/2013. -empiric IV Vancomycin and Zosyn.  -Blood cultures--neg  -Continue wound care as directed by general surgery.  -now with EC fistula  -PICC and TNA per surgery  -abx per ID  -continue long acting oxycontin 10mg  q 12hrs for pain control  -Patient is asking for OxyIR only 2-3 times per day--I have educated the patient that he may ask for up to 6 times per day if pain remains uncontrolled #2 sepsis  Likely secondary to problem #1.  -Wound cultures prelim with GPC in pairs and clusters-->Coag Neg Staph -Remains hemodynamically stable  #3 hypotension  -resolved  -secondary to problem #2.  -Blood pressure responded to IV fluids.  -Restart low-dose carvedilol  #4 atrial fibrillation/atrial flutter  Likely secondary to problem #1.  -currently rate controlled. EKG showed flutter waves.  -Cardiac enzymes have been negative x3. 2-D echo with EF of 50-55%. Wall motion abnormalities cannot be excluded.  -CHADS 2 score is about 3.  -Continue Coreg for rate control.  -Eliquis was discontinued as patient went for emergent surgery secondary to problem #1. General surgery to advise on anticoagulation, eliquis, may be resumed.  -start heparin drip until surgery clears pt for Eliquis  -scheduled for TEE cardioversion however in light of problem #1 this may be postponed. Will defer to cardiology.  - F/u with cardiology 2-3 weeks post discharge.  #5 right lower extremity pain  Secondary to fall. Pain is more in the posterior thigh/hamstrings and posterior Likely musculoskeletal in nature. Patient with clinical  improvement. Continue warm compresses. PT/OT. Follow  #6 leukocytosis/fever  Likely secondary to problem #1.  -resolvedCurrently afebrile.  -White count is slowly trending down.  -Chest x-Hogen is negative for infiltrate. Patient with no respiratory symptoms.  #7 chronic combined systolic and diastolic CHF  -Initial CXR with volume overload. given 4 doses of lasix 40mg  IV, then back to home dose. Stable.  -Restart low-dose carvedilol  -restarted home po lasix am 11/13  -08/12/2013 echocardiogram shows EF 50-55%--EF improved compared to one year ago (35-40%) #8 chronic kidney disease stage III  -Creatinine is trending back down. ACE inhibitor and Lasix had been discontinued.  -Plan to restart ACE inhibitor if renal function remains stable  #9 hyperlipidemia  Continue statin.  #10 4.5 cm ascending aortic aneurysm per CT scan of 08/01/2013  Stable. Asymptomatic. Continue risk factor modification with good blood pressure control. Will need outpatient followup with vascular surgery as outpatient.  #11 type 2 diabetes  Hemoglobin A1c was 9.5 on 08/02/2013.Continue Lantus 65 units daily. -Sliding scale insulin.  -CBGs controlled on TNA  Hypomagnesemia -Replete Family Communication: wife at beside  Disposition Plan: Home when cleared by surgery           Procedures/Studies: Ct Abdomen Pelvis Wo Contrast  08/14/2013   CLINICAL DATA:  Fever, leukocytosis, prior hernia repairs and appendectomy  EXAM: CT ABDOMEN AND PELVIS WITHOUT CONTRAST  TECHNIQUE: Multidetector CT imaging of the abdomen and pelvis was performed following the standard protocol without intravenous contrast.  COMPARISON:  None.  FINDINGS: Note: Given body habitus, the entire anterior abdominal wall (including a portion of the abnormality) could not be included in the field of view despite two  separate attempts.  Prior ventral hernia mesh repair. Just anterior to the mesh, within the anterior abdominal wall, is an abdominal  wall fluid collection/abscess measuring approximately 14.5 x 18.1 x 26.1 cm (incompletely visualized). Along the superior aspect of the fluid collection, gas extends to immediately beneath the skin surface (series 10/image 27). Bowel loops are displaced posteriorly by the large collection fluid without definite communication.  A small hernia containing fat and a loop of nondilated colon is present inferiorly (sagittal image 88), to the left of midline (series 10/image 81 and 86).  Minimal left basilar atelectasis.  Cardiomegaly. Coronary atherosclerosis. ICD leads, incompletely visualized.  Moderate hepatic steatosis.  Unenhanced spleen and pancreas are unremarkable.  Low-density nodular thickening of the bilateral adrenal glands, likely reflecting adrenal adenomas.  Gallbladder is notable for layering gallstone. No intrahepatic or extrahepatic ductal dilatation.  Kidneys are unremarkable. No renal calculi or hydronephrosis.  No evidence of bowel obstruction. Prior appendectomy.  Atherosclerotic calcifications of the abdominal aorta and branch vessels.  No abdominopelvic ascites.  No suspicious abdominopelvic lymphadenopathy.  Prostate is unremarkable.  Bladder is within normal limits.  Degenerative changes of the visualized thoracolumbar spine.  IMPRESSION: 14.5 x 18.1 x 26.1 cm fluid collection/abscess within the anterior abdominal wall, as described above, incompletely visualized. The collection appears just anterior to prior ventral hernia mesh.  Given the size and proximity to the skin surface, incision and drainage is suggested.  These results were called by telephone at the time of interpretation on 08/14/2013 at 3:22 PM to Dr. Irine Seal , who verbally acknowledged these results.   Electronically Signed   By: Julian Hy M.D.   On: 08/14/2013 15:28   Dg Chest 2 View  08/01/2013   CLINICAL DATA:  Chest pain, shortness of breath.  EXAM: CHEST  2 VIEW  COMPARISON:  August 24, 2012.  FINDINGS:  Stable cardiomegaly. Left-sided pacemaker is noted. No pleural effusion or pneumothorax is noted. Mild central pulmonary vascular congestion is noted which appears to be chronic. No acute pulmonary disease is noted.  IMPRESSION: No active cardiopulmonary disease.   Electronically Signed   By: Sabino Dick M.D.   On: 08/01/2013 17:06   Dg Hip Complete Right  08/11/2013   CLINICAL DATA:  Right posterior hip pain.  EXAM: RIGHT HIP - COMPLETE 2+ VIEW  COMPARISON:  None.  FINDINGS: There is no evidence of hip fracture or dislocation. There is no evidence of arthropathy or other focal bone abnormality.  IMPRESSION: No acute osseous injury of the right hip.   Electronically Signed   By: Kathreen Devoid   On: 08/11/2013 15:16   Ct Angio Chest Pe W/cm &/or Wo Cm  08/01/2013   CLINICAL DATA:  Shortness of Breath,  EXAM: CT ANGIOGRAPHY CHEST WITH CONTRAST  TECHNIQUE: Multidetector CT imaging of the chest was performed using the standard protocol during bolus administration of intravenous contrast. Multiplanar CT image reconstructions including MIPs were obtained to evaluate the vascular anatomy.  CONTRAST:  157mL OMNIPAQUE IOHEXOL 350 MG/ML SOLN  COMPARISON:  06/28/2012  FINDINGS: There is fairly good contrast opacification of the pulmonary artery branches with no convincing filling defects to suggest acute PE. There is good contrast opacification of the thoracic aorta with some scattered plaque, no evidence of dissection or stenosis. There is dilatation of the ascending aorta measuring up to 4.5 cm transverse diameter, stable by my measurement since prior study. Bovine brachiocephalic arterial origin anatomy without proximal stenosis. No pleural or pericardial effusion. No hilar or  mediastinal adenopathy. Transvenous pacing leads are noted. Minimal linear scarring or subsegmental atelectasis in both lower lobes. Lungs otherwise clear. Visualized portions of upper abdomen unremarkable. Multiple bridging osteophytes  throughout the mid and lower thoracic spine. Sternum intact.  Review of the MIP images confirms the above findings.  IMPRESSION: 1. 4.5 cm ascending aortic aneurysm without complicating features. 2. No evidence of acute pulmonary embolism or aortic dissection.   Electronically Signed   By: Arne Cleveland M.D.   On: 08/01/2013 19:55   Dg Chest Port 1 View  08/18/2013   CLINICAL DATA:  Confirm line placement  EXAM: PORTABLE CHEST - 1 VIEW  COMPARISON:  08/17/2013  FINDINGS: Two images were taken. The patient is in semi-upright position and is slightly rotated to the right. A right upper extremity PICC has been placed, the distal tip of which is in the mid superior vena cava. Left chest wall triple lead pacemaker appears stable. Cardiomegaly. There is diffuse pulmonary vascular congestion and similar appearance of pulmonary edema. No pneumothorax or pleural effusion is appreciated.  IMPRESSION: 1. Right upper extremity PICC terminates in the mid superior vena cava. 2. Stable congestive heart failure pattern.   Electronically Signed   By: Curlene Dolphin M.D.   On: 08/18/2013 21:39   Dg Chest Port 1 View  08/17/2013   CLINICAL DATA:  Respiratory failure  EXAM: PORTABLE CHEST - 1 VIEW  COMPARISON:  08/15/2013.  FINDINGS: Persistent cardiomegaly with pulmonary vascular prominence and interstitial prominence suggesting congestive heart failure with interstitial edema noted. Interstitial pneumonitis cannot be excluded. Cardiac pacer. Lead tips in stable position. No pneumothorax. No acute osseous abnormality.  IMPRESSION: Stable chest with changes of congestive heart failure with pulmonary interstitial edema. Cardiac pacer in stable position.   Electronically Signed   By: Marcello Moores  Register   On: 08/17/2013 07:41   Dg Chest Portable 1 View  08/15/2013   CLINICAL DATA:  Sepsis, respiratory distress  EXAM: PORTABLE CHEST - 1 VIEW  COMPARISON:  Prior radiograph from 08/13/2013  FINDINGS: Left-sided pacemaker is  unchanged. Cardiomegaly is stable as compared to the prior exam.  Lungs are normally inflated. There has been worsening of pulmonary vascular congestion and edema as compared to the prior examination. No definite pleural effusion. No focal infiltrates appreciated. No pneumothorax.  Osseous structures are unchanged.  IMPRESSION: Stable cardiomegaly with interval worsening of pulmonary vascular congestion/ edema relative to 08/13/2013.   Electronically Signed   By: Jeannine Boga M.D.   On: 08/15/2013 01:16   Dg Chest Port 1 View  08/13/2013   CLINICAL DATA:  Leukocytosis.  EXAM: PORTABLE CHEST - 1 VIEW  COMPARISON:  08/01/2013  FINDINGS: No focal pulmonary consolidation is identified. There is stable mild pulmonary venous prominence without overt edema or visualized pleural fluid. The heart remains mildly enlarged. There is stable appearance of a biventricular pacemaker.  IMPRESSION: No acute findings. Stable cardiomegaly and pulmonary venous prominence without overt edema.   Electronically Signed   By: Aletta Edouard M.D.   On: 08/13/2013 15:44         Subjective: Patient continues to complain of abdominal pain around surgical site. Denies fevers, chills, chest pain, shortness of breath, vomiting, diarrhea, dysuria. No headaches or visual disturbance.  Objective: Filed Vitals:   08/19/13 1405 08/19/13 2037 08/20/13 0434 08/20/13 1318  BP: 147/82 131/79 135/62 152/81  Pulse: 60 62 59 60  Temp: 98 F (36.7 C) 97.4 F (36.3 C) 97.4 F (36.3 C) 98.2 F (36.8 C)  TempSrc: Oral Oral Axillary Oral  Resp: 16 24 16 16   Height:      Weight:   155.5 kg (342 lb 13 oz)   SpO2: 99% 100% 100% 98%    Intake/Output Summary (Last 24 hours) at 08/20/13 1526 Last data filed at 08/20/13 1449  Gross per 24 hour  Intake   1167 ml  Output   2100 ml  Net   -933 ml   Weight change: -6.1 kg (-13 lb 7.2 oz) Exam:   General:  Pt is alert, follows commands appropriately, not in acute  distress  HEENT: No icterus, No thrush, [place]/AT  Cardiovascular: RRR, S1/S2, no rubs, no gallops  Respiratory: Diminished breath sounds at the bases but clear to auscultation.  Abdomen: Soft/+BS, non distended, no guarding; large ventral wound with ostomy bag; scattered patches of by mouth erythema.  Extremities: No edema, No lymphangitis, No petechiae, No rashes, no synovitis  Data Reviewed: Basic Metabolic Panel:  Recent Labs Lab 08/16/13 0342 08/17/13 0325 08/18/13 0645 08/19/13 0435 08/20/13 0528  NA 132* 134* 136 138 140  K 4.1 4.4 3.9 3.8 3.5  CL 97 99 101 101 99  CO2 26 26 28 30  34*  GLUCOSE 214* 202* 116* 103* 107*  BUN 40* 30* 18 14 12   CREATININE 2.03* 1.48* 1.04 1.08 1.06  CALCIUM 8.9 9.1 9.0 9.2 8.9  MG 1.9  --   --  1.5 1.4*  PHOS  --   --   --  3.9 3.7   Liver Function Tests:  Recent Labs Lab 08/13/13 2007 08/15/13 0049 08/19/13 0435  AST 80* 53* 36  ALT 37 27 21  ALKPHOS 53 40 37*  BILITOT 0.9 1.1 0.5  PROT 7.1 6.3 6.8  ALBUMIN 2.2* 1.9* 2.0*   No results found for this basename: LIPASE, AMYLASE,  in the last 168 hours No results found for this basename: AMMONIA,  in the last 168 hours CBC:  Recent Labs Lab 08/16/13 0342 08/17/13 0325 08/18/13 0645 08/19/13 0435 08/20/13 0528  WBC 15.3* 12.3* 11.4* 11.2* 10.3  NEUTROABS 11.9* 9.4* 8.5* 7.9*  --   HGB 11.2* 12.1* 12.4* 12.1* 11.9*  HCT 33.5* 36.7* 37.3* 36.7* 36.0*  MCV 96.5 97.1 96.6 97.3 97.8  PLT 312 366 402* 392 374   Cardiac Enzymes:  Recent Labs Lab 08/15/13 0049  TROPONINI <0.30   BNP: No components found with this basename: POCBNP,  CBG:  Recent Labs Lab 08/19/13 2034 08/20/13 0028 08/20/13 0430 08/20/13 0742 08/20/13 1157  GLUCAP 109* 126* 103* 104* 129*    Recent Results (from the past 240 hour(s))  URINE CULTURE     Status: None   Collection Time    08/11/13  6:24 PM      Result Value Range Status   Specimen Description URINE, CLEAN CATCH   Final    Special Requests NONE   Final   Culture  Setup Time     Final   Value: 08/12/2013 02:25     Performed at [facility]     Final   Value: NO GROWTH     Performed at [facility]   Culture     Final   Value: NO GROWTH     Performed at [facility]   Report Status 08/13/2013 FINAL   Final  CULTURE, BLOOD (ROUTINE X 2)     Status: None   Collection Time    08/13/13  2:55 PM  Result Value Range Status   Specimen Description BLOOD LEFT HAND   Final   Special Requests BOTTLES DRAWN AEROBIC ONLY 10CC   Final   Culture  Setup Time     Final   Value: 08/13/2013 22:30     Performed at [facility]   Culture     Final   Value: NO GROWTH 5 DAYS     Performed at [facility]   Report Status 08/19/2013 FINAL   Final  CULTURE, BLOOD (ROUTINE X 2)     Status: None   Collection Time    08/13/13  3:00 PM      Result Value Range Status   Specimen Description BLOOD LEFT ARM   Final   Special Requests BOTTLES DRAWN AEROBIC AND ANAEROBIC 10CC   Final   Culture  Setup Time     Final   Value: 08/13/2013 22:31     Performed at [facility]   Culture     Final   Value: NO GROWTH 5 DAYS     Performed at [facility]   Report Status 08/19/2013 FINAL   Final  URINE CULTURE     Status: None   Collection Time    08/13/13  3:11 PM      Result Value Range Status   Specimen Description URINE, CLEAN CATCH   Final   Special Requests NONE   Final   Culture  Setup Time     Final   Value: 08/14/2013 06:01     Performed at [facility]     Final   Value: NO GROWTH     Performed at [facility]   Culture     Final   Value: NO GROWTH     Performed at [facility]   Report Status 08/15/2013 FINAL   Final  SURGICAL PCR SCREEN     Status: None   Collection Time    08/14/13  4:35 PM      Result Value Range Status   MRSA, PCR NEGATIVE  NEGATIVE Final   Staphylococcus aureus NEGATIVE  NEGATIVE  Final   Comment:            The Xpert SA Assay (FDA     approved for NASAL specimens     in patients over 58 years of age),     is one component of     a comprehensive surveillance     program.  Test performance has     been validated by [employer] for patients greater     than or equal to 16 year old.     It is not intended     to diagnose infection nor to     guide or monitor treatment.  CULTURE, ROUTINE-ABSCESS     Status: None   Collection Time    08/14/13  6:28 PM      Result Value Range Status   Specimen Description ABDOMEN INCISION DRAINAGE   Final   Special Requests NONE   Final   Gram Stain     Final   Value: ABUNDANT WBC PRESENT, PREDOMINANTLY PMN     NO SQUAMOUS EPITHELIAL CELLS SEEN     MODERATE GRAM POSITIVE COCCI IN PAIRS     IN CLUSTERS     Performed at [facility]   Culture     Final   Value: RARE STAPHYLOCOCCUS SPECIES (COAGULASE NEGATIVE)  Performed at [facility]   Report Status 08/19/2013 FINAL   Final  ANAEROBIC CULTURE     Status: None   Collection Time    08/14/13  6:28 PM      Result Value Range Status   Specimen Description ABDOMEN INCISION DRAINAGE   Final   Special Requests NONE   Final   Gram Stain     Final   Value: ABUNDANT WBC PRESENT, PREDOMINANTLY PMN     NO SQUAMOUS EPITHELIAL CELLS SEEN     FEW GRAM POSITIVE COCCI     IN PAIRS IN CLUSTERS     Performed at [facility]   Culture     Final   Value: NO ANAEROBES ISOLATED     Performed at [facility]   Report Status 08/20/2013 FINAL   Final  CULTURE, BLOOD (ROUTINE X 2)     Status: None   Collection Time    08/15/13 12:40 AM      Result Value Range Status   Specimen Description BLOOD LEFT ARM   Final   Special Requests BOTTLES DRAWN AEROBIC AND ANAEROBIC 5CC   Final   Culture  Setup Time     Final   Value: 08/15/2013 13:18     Performed at [facility]   Culture     Final   Value:        BLOOD CULTURE RECEIVED NO GROWTH TO DATE  CULTURE WILL BE HELD FOR 5 DAYS BEFORE ISSUING A FINAL NEGATIVE REPORT     Performed at [facility]   Report Status PENDING   Incomplete  CULTURE, BLOOD (ROUTINE X 2)     Status: None   Collection Time    08/15/13 12:50 AM      Result Value Range Status   Specimen Description BLOOD LEFT ARM   Final   Special Requests BOTTLES DRAWN AEROBIC AND ANAEROBIC 5CC   Final   Culture  Setup Time     Final   Value: 08/15/2013 13:18     Performed at [facility]   Culture     Final   Value:        BLOOD CULTURE RECEIVED NO GROWTH TO DATE CULTURE WILL BE HELD FOR 5 DAYS BEFORE ISSUING A FINAL NEGATIVE REPORT     Performed at [facility]   Report Status PENDING   Incomplete  URINE CULTURE     Status: None   Collection Time    08/15/13  2:37 AM      Result Value Range Status   Specimen Description URINE, CATHETERIZED   Final   Special Requests NONE   Final   Culture  Setup Time     Final   Value: 08/15/2013 16:08     Performed at [facility]   Culture     Final   Value: NO GROWTH     Performed at [facility]   Report Status 08/17/2013 FINAL   Final  CLOSTRIDIUM DIFFICILE BY PCR     Status: None   Collection Time    08/15/13  9:04 AM      Result Value Range Status   C difficile by pcr NEGATIVE  NEGATIVE Final   Comment: Performed at [facility]     Scheduled Meds: . acetaminophen  1,000 mg Oral TID  . atorvastatin  40 mg Oral QODAY  . carvedilol  3.125 mg Oral BID WC  . furosemide  80 mg  Oral BID  . insulin aspart  0-20 Units Subcutaneous Q4H  . insulin glargine  65 Units Subcutaneous QHS  . lip balm  1 application Topical BID  . OxyCODONE  10 mg Oral Q12H  . piperacillin-tazobactam (ZOSYN)  IV  3.375 g Intravenous Q8H  . psyllium  1 packet Oral Daily  . saccharomyces boulardii  250 mg Oral BID  . sodium chloride  3 mL Intravenous Q12H  . vancomycin  2,000 mg Intravenous Q24H   Continuous Infusions: . sodium chloride 10 mL/hr  (08/19/13 0010)  . [name] KitchenTPN (CLINIMIX-E) Adult 50 mL/hr at 08/19/13 1747   And  . fat emulsion 250 mL (08/19/13 1747)  . [name] KitchenTPN (CLINIMIX-E) Adult     And  . fat emulsion    . heparin 2,200 Units/hr (08/20/13 1320)     Neal Oshea, DO  [facility] Pager [phone]  If 7PM-7AM, please contact night-coverage [email or web address] Password [email or web address] 08/20/2013, 3:26 PM   LOS: 9 days

## 2013-08-20 NOTE — Progress Notes (Signed)
ANTICOAGULATION CONSULT NOTE - Follow Up Consult  Pharmacy Consult for Heparin Indication: atrial fibrillation  No Known Allergies  Patient Measurements: Height: 6' (182.9 cm) Weight: 342 lb 13 oz (155.5 kg) IBW/kg (Calculated) : 77.6 Heparin Dosing Weight: 114kg  Vital Signs: Temp: 98.2 F (36.8 C) (11/14 1318) Temp src: Oral (11/14 1318) BP: 152/81 mmHg (11/14 1318) Pulse Rate: 60 (11/14 1318)  Labs:  Recent Labs  08/18/13 0645 08/19/13 0435 08/20/13 0235 08/20/13 0528 08/20/13 1000 08/20/13 1710  HGB 12.4* 12.1*  --  11.9*  --   --   HCT 37.3* 36.7*  --  36.0*  --   --   PLT 402* 392  --  374  --   --   HEPARINUNFRC  --   --  0.22*  --  0.17* 0.22*  CREATININE 1.04 1.08  --  1.06  --   --     Estimated Creatinine Clearance: 114 ml/min (by C-G formula based on Cr of 1.06).  Assessment: 62 yo male s/p surgery for infected prosthetic Gore-Tex mesh removal with abscess I&D now on IV abx's and TNA. Apixiban has been on hold due to surgery and NPO status. IV heparin per pharmacy started 11/13 pm for current Afib until surgery clears patient to restart apixiban.  Heparin level subtherapeutic (0.22) on 2200 units/hr  CBC stable, no bleeding reported  No infusion line issues per RN  Some bleeding at incision site after wound manipulation but only a small amount per RN   Goal of Therapy:  Heparin level 0.3-0.7 units/ml Monitor platelets by anticoagulation protocol: Yes   Plan:   Heparin IV bolus of 2000 units  Increase heparin to 2400 units/hr  Recheck heparin level in 6hrs  Follow up ability to resume oral anticoagulant - apixaban   Thank you for the consult.  Johny Drilling, PharmD Clinical Pharmacist Pager: [phone] Pharmacy: [phone] 08/20/2013 7:15 PM

## 2013-08-20 NOTE — Progress Notes (Signed)
Doing ok. About 600cc from [facility]' pouch. No n/v.   Enteric contents pooling under RLQ/lower midline skin flap as feared. Some RLQ skin flap superficial erythema  Discussed several options with wound care.  Since No white wound vac in hospital today, will try mepitel over entire open abd wound which is small bowel, place red rubber down into skin flap gutter thru [facility]' pouch and place on low suction to help prevent pooling of enteric contents into wound  Pt is NOT ready for placement. We need to make sure this new wound management system works. if doesn't could try mepitel over bowel, white foam in gutters, and NPWT at around 40-77mmHg of suction.   Will defer anticoagulation management to medicine - but does he need to be on Hep gtt? Can use therapeutic lovenox from my point of view. Will still hold off on using agents like xarelto for now.   Cont bowel rest/TPN  Leighton Ruff. Redmond Pulling, MD, FACS General, Bariatric, & Minimally Invasive Surgery [facility], [place]

## 2013-08-20 NOTE — Progress Notes (Signed)
PARENTERAL NUTRITION CONSULT NOTE - FOLLOW UP  Pharmacy Consult for TNA Indication: Enterocutaneous fistula  No Known Allergies  Patient Measurements: Height: 6' (182.9 cm) Weight: 342 lb 13 oz (155.5 kg) IBW/kg (Calculated) : 77.6 Adjusted Body Weight: 102.6kg  Vital Signs: Temp: 97.4 F (36.3 C) (11/14 0434) Temp src: Axillary (11/14 0434) BP: 135/62 mmHg (11/14 0434) Pulse Rate: 59 (11/14 0434) Intake/Output from previous day: 11/13 0701 - 11/14 0700 In: 1281.7 [I.V.:113.2; IV Piggyback:600; TPN:568.5] Out: 2025 [Urine:1525; Drains:500] Intake/Output from this shift:    Labs:  Recent Labs  08/18/13 0645 08/19/13 0435 08/20/13 0528  WBC 11.4* 11.2* 10.3  HGB 12.4* 12.1* 11.9*  HCT 37.3* 36.7* 36.0*  PLT 402* 392 374     Recent Labs  08/18/13 0645 08/18/13 1500 08/19/13 0435 08/20/13 0528  NA 136  --  138 140  K 3.9  --  3.8 3.5  CL 101  --  101 99  CO2 28  --  30 34*  GLUCOSE 116*  --  103* 107*  BUN 18  --  14 12  CREATININE 1.04  --  1.08 1.06  CALCIUM 9.0  --  9.2 8.9  MG  --   --  1.5 1.4*  PHOS  --   --  3.9 3.7  PROT  --   --  6.8  --   ALBUMIN  --   --  2.0*  --   AST  --   --  36  --   ALT  --   --  21  --   ALKPHOS  --   --  37*  --   BILITOT  --   --  0.5  --   PREALBUMIN  --  9.2* 10.3*  --   TRIG  --   --  124  --    Estimated Creatinine Clearance: 114 ml/min (by C-G formula based on Cr of 1.06).    Recent Labs  08/19/13 2034 08/20/13 0028 08/20/13 0430  GLUCAP 109* 126* 103*    Medications:  Scheduled:  . acetaminophen  1,000 mg Oral TID  . atorvastatin  40 mg Oral QODAY  . carvedilol  3.125 mg Oral BID WC  . furosemide  80 mg Oral BID  . insulin aspart  0-20 Units Subcutaneous Q4H  . insulin glargine  65 Units Subcutaneous QHS  . lip balm  1 application Topical BID  . magnesium sulfate 1 - 4 g bolus IVPB  2 g Intravenous Once  . OxyCODONE  10 mg Oral Q12H  . piperacillin-tazobactam (ZOSYN)  IV  3.375 g Intravenous  Q8H  . potassium chloride  10 mEq Intravenous Q1 Hr x 3  . psyllium  1 packet Oral Daily  . saccharomyces boulardii  250 mg Oral BID  . sodium chloride  3 mL Intravenous Q12H  . vancomycin  2,000 mg Intravenous Q24H   Infusions:  . sodium chloride 10 mL/hr (08/19/13 0010)  . [name] KitchenTPN (CLINIMIX-E) Adult 50 mL/hr at 08/19/13 1747   And  . fat emulsion 250 mL (08/19/13 1747)  . heparin 1,800 Units/hr (08/20/13 0432)    Insulin Requirements in the past 24 hours:  Lantus 65units sq qHS (increasing doses since admit) 11 units SSI required  - CBG q4h with resistant scale coverage  Nutritional Goals: Per RD 11/13 Kcal: 2800-3000 Protein: 160-180 grams Fluid: 3.3-3.6 L/day  Clinimix E5/15 at goal rate 140 ml/hr +  20% lipids at 84ml/hr = 168g protein, 2865 kcal, 3.36L fluid  per day  Current Nutrition:  NPO Clinimix E5/15 at 50 ml/hr and 20% lipids at 66ml/hr   IVF: NS at 35ml/hr  Assessment: 62 yo male with large abdominal abscess with infected old Gore Tex mesh s/p I&D abscess, removal of mesh, debridement of abdominal wall and peritoneum. 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 TNA started 11/12 at 10pm.   Glucose: Hx DM on insulin PTA, CBG currently controlled  Electrolytes: K and Mg slightly low - monitor fistula output closely (500cc yesterday) as this will deplete electrolytes  LFTs: wnl, albumin low (2.0)  TGs: wnl, 124 (11/13)  Prealbumin: low, 10.3 (11/13)  Monitor fluid status very closely if TNA gets to high goal rate (currently -700cc and wt down 12lb)   TPN Access: Triple lumen PICC placed 11/12 TPN day#: 3  Plan: At 1800 today:  Increase Clinimix E5/15 to 55ml/hr.  20% fat emulsion at 26ml/hr.  Plan to advance as tolerated to the goal rate.  KCl 16meq IV x 3 and MgSO4 2g IV x 1 today  TNA to contain standard multivitamins and trace elements.  IVF per MD  TNA lab panels on Mondays & Thursdays.   Peggyann Juba, PharmD, BCPS Pager:  [phone] 08/20/2013,7:43 AM

## 2013-08-20 NOTE — Progress Notes (Signed)
Suctioned Edkins pouch. 273ml was suctioned and documented in chart. Patient tolerated well. Will continue to monitor.

## 2013-08-20 NOTE — Progress Notes (Signed)
ANTICOAGULATION CONSULT NOTE - Follow Up Consult  Pharmacy Consult for Heparin Indication: atrial fibrillation  No Known Allergies  Patient Measurements: Height: 6' (182.9 cm) Weight: 342 lb 13 oz (155.5 kg) IBW/kg (Calculated) : 77.6 Heparin Dosing Weight:   Vital Signs: Temp: 97.4 F (36.3 C) (11/14 0434) Temp src: Axillary (11/14 0434) BP: 135/62 mmHg (11/14 0434) Pulse Rate: 59 (11/14 0434)  Labs:  Recent Labs  08/18/13 0645 08/19/13 0435 08/20/13 0235 08/20/13 0528  HGB 12.4* 12.1*  --  11.9*  HCT 37.3* 36.7*  --  36.0*  PLT 402* 392  --  374  HEPARINUNFRC  --   --  0.22*  --   CREATININE 1.04 1.08  --   --     Estimated Creatinine Clearance: 111.9 ml/min (by C-G formula based on Cr of 1.08).   Medications:  Infusions:  . sodium chloride 10 mL/hr (08/19/13 0010)  . [name] KitchenTPN (CLINIMIX-E) Adult 50 mL/hr at 08/19/13 1747   And  . fat emulsion 250 mL (08/19/13 1747)  . heparin 1,800 Units/hr (08/20/13 0432)    Assessment: Patient with afib.  No issues with drip per RN.  Goal of Therapy:  Heparin level 0.3-0.7 units/ml Monitor platelets by anticoagulation protocol: Yes   Plan:  Increase heparin to 1800 units/hr Recheck level at [place] 08/20/2013,6:41 AM

## 2013-08-20 NOTE — Progress Notes (Signed)
Nutrition Follow-up  INTERVENTION: TPN per Pharmacy, see re-estimated energy/protein recommendations below Diet advancement per MD RD to continue to monitor nutrition care plan  NUTRITION DIAGNOSIS: Inadequate oral intake related to EC fistula as evidenced by NPO status; ongoing  Goal: Pt to meet >/= 90% of their estimated nutrition needs; not yet met  Monitor:  TPN rate; Clinimix E5/15 at 50 ml/hr and 20% lipids at 11ml/hr; goal rate 50 ml/hr Weight trends; down 14 lbs Labs; Low GFR trending up, low magnesium, blood glucose 103-214 mg/dL Diet advancement; none  Reason for Assessment: Consult for new TPN  62 y.o. male  Admitting Dx: Infected prosthetic mesh of abdominal wall  ASSESSMENT: 62 y.o. male with known history of chronic systolic heart failure status post defibrillator placement last EF measured was in 2013 was 35-40%, on home oxygen, OSA noncompliant with CPAP, diabetes mellitus, hypertension and hyperlipidemia presented to the ER because right leg pain after sustaining a fall the night prior to admission. He was noted to be febrile with some abdominal wall swelling. CT scan revealed a large abdominal wall abscess. POD#2 from drainage of 3 L of pus and removal of his old Gore-Tex mesh. 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 TNA started 11/12 at 10pm. Clinimix E5/15 at 50 ml/hr and 20% lipids at 30ml/hr; Goal rate 140 ml/hr   Height: Ht Readings from Last 1 Encounters:  08/14/13 6' (1.829 m)    Weight: Wt Readings from Last 1 Encounters:  08/20/13 342 lb 13 oz (155.5 kg)    BMI:  Body mass index is 46.48 kg/(m^2). (morbidly obese)  Estimated Nutritional Needs: Kcal: [id number] Protein: 160-180 grams Fluid: 3.3-3.5 L/day  Skin: non-pitting RLE and LLE edema; abdominal incision  Diet Order: NPO   Intake/Output Summary (Last 24 hours) at 08/20/13 1559 Last data filed at 08/20/13 1449  Gross per 24 hour  Intake   1167 ml  Output   2100 ml   Net   -933 ml    Last BM: 11/11  Labs:   Recent Labs Lab 08/16/13 0342  08/18/13 0645 08/19/13 0435 08/20/13 0528  NA 132*  < > 136 138 140  K 4.1  < > 3.9 3.8 3.5  CL 97  < > 101 101 99  CO2 26  < > 28 30 34*  BUN 40*  < > 18 14 12   CREATININE 2.03*  < > 1.04 1.08 1.06  CALCIUM 8.9  < > 9.0 9.2 8.9  MG 1.9  --   --  1.5 1.4*  PHOS  --   --   --  3.9 3.7  GLUCOSE 214*  < > 116* 103* 107*  < > = values in this interval not displayed.  CBG (last 3)   Recent Labs  08/20/13 0430 08/20/13 0742 08/20/13 1157  GLUCAP 103* 104* 129*    Scheduled Meds: . acetaminophen  1,000 mg Oral TID  . atorvastatin  40 mg Oral QODAY  . carvedilol  3.125 mg Oral BID WC  . furosemide  80 mg Oral BID  . insulin aspart  0-20 Units Subcutaneous Q4H  . insulin glargine  65 Units Subcutaneous QHS  . lip balm  1 application Topical BID  . OxyCODONE  10 mg Oral Q12H  . piperacillin-tazobactam (ZOSYN)  IV  3.375 g Intravenous Q8H  . psyllium  1 packet Oral Daily  . saccharomyces boulardii  250 mg Oral BID  . sodium chloride  3 mL Intravenous Q12H  . vancomycin  2,000  mg Intravenous Q24H    Continuous Infusions: . sodium chloride 10 mL/hr (08/19/13 0010)  . [name] KitchenTPN (CLINIMIX-E) Adult 50 mL/hr at 08/19/13 1747   And  . fat emulsion 250 mL (08/19/13 1747)  . [name] KitchenTPN (CLINIMIX-E) Adult     And  . fat emulsion    . heparin 2,200 Units/hr (08/20/13 1320)    Past Medical History  Diagnosis Date  . Sleep apnea   . Diabetes mellitus   . Hypertension   . Hyperlipidemia   . CHF (congestive heart failure)   . Hernia   . AAA (abdominal aortic aneurysm)/ 4.5 cm ascending per CT angio 08/01/13 08/12/2013  . Infected prosthetic mesh of abdominal wall with GIANT abscess s/p removal 08/14/2013 08/14/2013    Past Surgical History  Procedure Laterality Date  . Appendectomy    . Tonsillectomy    . Nasal septum surgery    . Bowel resection  1980s?    ?colectomy/ostomy - Dr Elesa Hacker  . Ostomy  take down  1980s?    Dr Elesa Hacker  . Incisional hernia repair  1980s?    Dr. Lindon Romp - w mesh  . Knee surgery Right   . Incision and drainage abscess N/A 08/14/2013    Procedure: INCISION AND DRAINAGE ABSCESS, mesh ;  Surgeon: [name], MD;  Location: [facility];  Service: General;  Laterality: N/A;    Pryor Ochoa RD, LDN Inpatient Clinical Dietitian Pager: [phone] After Hours Pager: [phone]

## 2013-08-20 NOTE — Progress Notes (Signed)
Pt placed on Auto CPAP with 3 LPM O2 bleed in via FFM.  Pt tolerating well at this time, RT to monitor and assess as needed.

## 2013-08-20 NOTE — Progress Notes (Signed)
PT Cancellation Note  ___Treatment cancelled today due to medical issues with patient which prohibited   therapy  ___ Treatment cancelled today due to patient receiving procedure or test   ___ Treatment cancelled today due to patient's refusal to participate   _X_ Treatment cancelled today due to issues of open wound.  Nursing applying low suction dressing and will need to defer OOB activity at this time.  Rica Koyanagi  PTA [facility]  Acute  Rehab Pager      [phone]

## 2013-08-20 NOTE — Progress Notes (Signed)
ANTICOAGULATION CONSULT NOTE - Follow Up Consult  Pharmacy Consult for Heparin Indication: atrial fibrillation  No Known Allergies  Patient Measurements: Height: 6' (182.9 cm) Weight: 342 lb 13 oz (155.5 kg) IBW/kg (Calculated) : 77.6 Heparin Dosing Weight: 114kg  Vital Signs: Temp: 97.4 F (36.3 C) (11/14 0434) Temp src: Axillary (11/14 0434) BP: 135/62 mmHg (11/14 0434) Pulse Rate: 59 (11/14 0434)  Labs:  Recent Labs  08/18/13 0645 08/19/13 0435 08/20/13 0235 08/20/13 0528 08/20/13 1000  HGB 12.4* 12.1*  --  11.9*  --   HCT 37.3* 36.7*  --  36.0*  --   PLT 402* 392  --  374  --   HEPARINUNFRC  --   --  0.22*  --  0.17*  CREATININE 1.04 1.08  --  1.06  --     Estimated Creatinine Clearance: 114 ml/min (by C-G formula based on Cr of 1.06).   Medications:  Scheduled:  . acetaminophen  1,000 mg Oral TID  . atorvastatin  40 mg Oral QODAY  . carvedilol  3.125 mg Oral BID WC  . furosemide  80 mg Oral BID  . insulin aspart  0-20 Units Subcutaneous Q4H  . insulin glargine  65 Units Subcutaneous QHS  . lip balm  1 application Topical BID  . OxyCODONE  10 mg Oral Q12H  . piperacillin-tazobactam (ZOSYN)  IV  3.375 g Intravenous Q8H  . potassium chloride  10 mEq Intravenous Q1 Hr x 3  . psyllium  1 packet Oral Daily  . saccharomyces boulardii  250 mg Oral BID  . sodium chloride  3 mL Intravenous Q12H  . vancomycin  2,000 mg Intravenous Q24H   Infusions:  . sodium chloride 10 mL/hr (08/19/13 0010)  . [name] KitchenTPN (CLINIMIX-E) Adult 50 mL/hr at 08/19/13 1747   And  . fat emulsion 250 mL (08/19/13 1747)  . [name] KitchenTPN (CLINIMIX-E) Adult     And  . fat emulsion    . heparin      Assessment: 62 yo male s/p surgery for infected prosthetic Gore-Tex mesh removal with abscess I&D now on IV abx's and TNA. Apixiban has been on hold due to surgery and NPO status. IV heparin per pharmacy started 11/13 pm for current Afib until surgery clears patient to restart apixiban.  Heparin level  subtherapeutic (0.17) on 1800 units/hr  CBC stable, no bleeding reported   Goal of Therapy:  Heparin level 0.3-0.7 units/ml Monitor platelets by anticoagulation protocol: Yes   Plan:   Increase heparin to 2200 units/hr  Recheck heparin level in 6hrs  Follow up ability to resume oral anticoagulant - apixaban  Peggyann Juba, PharmD, BCPS Pager: [phone] 08/20/2013,10:37 AM

## 2013-08-21 DIAGNOSIS — J9620 Acute and chronic respiratory failure, unspecified whether with hypoxia or hypercapnia: Secondary | ICD-10-CM

## 2013-08-21 LAB — CBC
HCT: 36.4 % — ABNORMAL LOW (ref 39.0–52.0)
MCH: 32.6 pg (ref 26.0–34.0)
MCHC: 33.8 g/dL (ref 30.0–36.0)
RDW: 14.4 % (ref 11.5–15.5)
WBC: 13.4 10*3/uL — ABNORMAL HIGH (ref 4.0–10.5)

## 2013-08-21 LAB — CULTURE, BLOOD (ROUTINE X 2)
Culture: NO GROWTH
Culture: NO GROWTH

## 2013-08-21 LAB — BASIC METABOLIC PANEL
BUN: 12 mg/dL (ref 6–23)
Calcium: 9 mg/dL (ref 8.4–10.5)
Chloride: 98 mEq/L (ref 96–112)
GFR calc Af Amer: 83 mL/min — ABNORMAL LOW (ref >90)
GFR calc non Af Amer: 72 mL/min — ABNORMAL LOW (ref >90)
Glucose, Bld: 107 mg/dL — ABNORMAL HIGH (ref 70–99)
Potassium: 3.5 mEq/L (ref 3.5–5.1)
Sodium: 137 mEq/L (ref 135–145)

## 2013-08-21 LAB — GLUCOSE, CAPILLARY
Glucose-Capillary: 159 mg/dL — ABNORMAL HIGH (ref 70–99)
Glucose-Capillary: 159 mg/dL — ABNORMAL HIGH (ref 70–99)
Glucose-Capillary: 167 mg/dL — ABNORMAL HIGH (ref 70–99)

## 2013-08-21 LAB — HEPARIN LEVEL (UNFRACTIONATED): Heparin Unfractionated: 0.16 IU/mL — ABNORMAL LOW (ref 0.30–0.70)

## 2013-08-21 LAB — MAGNESIUM: Magnesium: 1.7 mg/dL (ref 1.5–2.5)

## 2013-08-21 MED ORDER — CARVEDILOL 6.25 MG PO TABS
6.2500 mg | ORAL_TABLET | Freq: Two times a day (BID) | ORAL | Status: DC
  Administered 2013-08-21 – 2013-08-24 (×6): 6.25 mg via ORAL
  Filled 2013-08-21 (×8): qty 1

## 2013-08-21 MED ORDER — HEPARIN (PORCINE) IN NACL 100-0.45 UNIT/ML-% IJ SOLN
2700.0000 [IU]/h | INTRAMUSCULAR | Status: DC
  Administered 2013-08-21 (×2): 2700 [IU]/h via INTRAVENOUS
  Filled 2013-08-21 (×3): qty 250

## 2013-08-21 MED ORDER — ENOXAPARIN SODIUM 150 MG/ML ~~LOC~~ SOLN
150.0000 mg | Freq: Once | SUBCUTANEOUS | Status: AC
  Administered 2013-08-21: 150 mg via SUBCUTANEOUS
  Filled 2013-08-21: qty 1

## 2013-08-21 MED ORDER — ENOXAPARIN SODIUM 150 MG/ML ~~LOC~~ SOLN
150.0000 mg | Freq: Two times a day (BID) | SUBCUTANEOUS | Status: DC
  Administered 2013-08-21 – 2013-08-24 (×6): 150 mg via SUBCUTANEOUS
  Filled 2013-08-21 (×7): qty 1

## 2013-08-21 MED ORDER — FAT EMULSION 20 % IV EMUL
250.0000 mL | INTRAVENOUS | Status: AC
  Administered 2013-08-21: 18:00:00 250 mL via INTRAVENOUS
  Filled 2013-08-21: qty 250

## 2013-08-21 MED ORDER — TRACE MINERALS CR-CU-F-FE-I-MN-MO-SE-ZN IV SOLN
INTRAVENOUS | Status: AC
  Administered 2013-08-21: 18:00:00 via INTRAVENOUS
  Filled 2013-08-21: qty 2400

## 2013-08-21 NOTE — Progress Notes (Signed)
Pt refused to wear CPAP tonight. Told to notify RN if he changes his mind. RT will continue to monitor.

## 2013-08-21 NOTE — Progress Notes (Signed)
PARENTERAL NUTRITION CONSULT NOTE - FOLLOW UP  Pharmacy Consult for TNA Indication: Enterocutaneous fistula  No Known Allergies  Patient Measurements: Height: 6' (182.9 cm) Weight: 339 lb 1.1 oz (153.8 kg) IBW/kg (Calculated) : 77.6 Adjusted Body Weight: 102.6kg  Vital Signs: Temp: 98.6 F (37 C) (11/15 0444) Temp src: Oral (11/15 0444) BP: 153/54 mmHg (11/15 0842) Pulse Rate: 60 (11/15 0842) Intake/Output from previous day: 11/14 0701 - 11/15 0700 In: 822 [I.V.:72; IV Piggyback:750] Out: 1325 [Urine:1025; Drains:300] Intake/Output from this shift: Total I/O In: -  Out: 400 [Urine:400]  Labs:  Recent Labs  08/19/13 0435 08/20/13 0528 08/21/13 0400  WBC 11.2* 10.3 13.4*  HGB 12.1* 11.9* 12.3*  HCT 36.7* 36.0* 36.4*  PLT 392 374 380     Recent Labs  08/18/13 1500 08/19/13 0435 08/20/13 0528 08/21/13 0400  NA  --  138 140 137  K  --  3.8 3.5 3.5  CL  --  101 99 98  CO2  --  30 34* 33*  GLUCOSE  --  103* 107* 107*  BUN  --  14 12 12   CREATININE  --  1.08 1.06 1.09  CALCIUM  --  9.2 8.9 9.0  MG  --  1.5 1.4* 1.7  PHOS  --  3.9 3.7  --   PROT  --  6.8  --   --   ALBUMIN  --  2.0*  --   --   AST  --  36  --   --   ALT  --  21  --   --   ALKPHOS  --  37*  --   --   BILITOT  --  0.5  --   --   PREALBUMIN 9.2* 10.3*  --   --   TRIG  --  124  --   --    Estimated Creatinine Clearance: 110.2 ml/min (by C-G formula based on Cr of 1.09).    Recent Labs  08/21/13 0044 08/21/13 0442 08/21/13 0750  GLUCAP 159* 115* 136*    Medications:  Scheduled:  . acetaminophen  1,000 mg Oral TID  . atorvastatin  40 mg Oral QODAY  . carvedilol  3.125 mg Oral BID WC  . furosemide  80 mg Oral BID  . insulin aspart  0-20 Units Subcutaneous Q4H  . insulin glargine  65 Units Subcutaneous QHS  . lip balm  1 application Topical BID  . OxyCODONE  10 mg Oral Q12H  . piperacillin-tazobactam (ZOSYN)  IV  3.375 g Intravenous Q8H  . psyllium  1 packet Oral Daily  .  saccharomyces boulardii  250 mg Oral BID  . sodium chloride  3 mL Intravenous Q12H  . vancomycin  2,000 mg Intravenous Q24H   Infusions:  . sodium chloride 10 mL/hr (08/19/13 0010)  . [name] KitchenTPN (CLINIMIX-E) Adult 75 mL/hr at 08/20/13 1714   And  . fat emulsion 250 mL (08/20/13 1714)  . heparin 2,700 Units/hr (08/21/13 0703)    Insulin Requirements in the past 24 hours:  Lantus 65units sq qHS (increasing doses since admit) 10 units SSI required  - CBG q4h with resistant scale coverage  Nutritional Goals: Per RD 11/13 Kcal: 2800-3000 Protein: 160-180 grams Fluid: 3.3-3.6 L/day  Clinimix E5/15 at goal rate 140 ml/hr +  20% lipids at 18ml/hr = 168g protein, 2865 kcal, 3.36L fluid per day - will likely need to max rate at 125 ml/hr due to max amount of volume that can be placed in TNA  bags.   Current Nutrition:  NPO Clinimix E5/15 at 50 ml/hr and 20% lipids at 68ml/hr   IVF: NS at 49ml/hr  Assessment: 62 yo male with large abdominal abscess with infected old Gore Tex mesh s/p I&D abscess, removal of mesh, debridement of abdominal wall and peritoneum. 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 TNA started 11/12 at 10pm.   Glucose: Hx DM on insulin PTA, CBG currently controlled 104-136  Electrolytes: K on low end of normal and stable - monitor fistula output closely (500cc yesterday) as this will deplete electrolytes  LFTs: wnl, albumin low (2.0)  TGs: wnl, 124 (11/13)  Prealbumin: low, 10.3 (11/13)  Monitor fluid status very closely if TNA gets to high goal rate - fluids not currently documented correctly 11/15   TPN Access: Triple lumen PICC placed 11/12 TPN day#: 4  Plan: At 1800 today:  Increase Clinimix E5/15 to 130ml/hr.  20% fat emulsion at 57ml/hr.  Plan to advance as tolerated to the goal rate.  TNA to contain standard multivitamins and trace elements.  IVF per MD  TNA lab panels on Mondays & Thursdays.   Adrian Saran, PharmD, BCPS Pager  [phone] 08/21/2013 9:55 AM

## 2013-08-21 NOTE — Progress Notes (Signed)
General Surgery Note  LOS: 10 days  POD -  7 Days Post-Op  Assessment/Plan: 1.    Infected prosthetic mesh of abdominal wall with GIANT abscess s/p removal - 08/14/2013 - [facility]  EC fistula  WBC - 13,400 - 08/21/2013  On Zosyn/Vanc  Suction pouch over fistula   2.  DVT prophylaxis - Heparin infusion 3.  Atrial fib/flutter   4.  Diabetes mellitus, insulin dependent (IDDM) 5.  Severe malnutrtion  On TPN 6.  Morbid Obesity, Class III, BMI 40-49.9 (morbid obesity) 7.    OBSTRUCTIVE SLEEP APNEA   Hypertension   Chronic combined systolic and diastolic CHF (congestive heart failure)   Right leg pain   AAA (abdominal aortic aneurysm)/ 4.5 cm ascending per CT angio 08/01/13  Subjective:  Doing okay.  His IV machine is acting up and beeping constantly.  He is having no specific complaint. Objective:   Filed Vitals:   08/21/13 0842  BP: 153/54  Pulse: 60  Temp:   Resp:      Intake/Output from previous day:  11/14 0701 - 11/15 0700 In: 822 [I.V.:72; IV Piggyback:750] Out: 1325 [Urine:1025; Drains:300]  Intake/Output this shift:  Total I/O In: -  Out: 400 [Urine:400]   Physical Exam:   General: Obese AA M who is alert and oriented.    HEENT: Normal. Pupils equal. .   Lungs: Clear   Abdomen: Large pouch over midline to capture EC contents.  Morbidly obese.   Lab Results:    Recent Labs  08/20/13 0528 08/21/13 0400  WBC 10.3 13.4*  HGB 11.9* 12.3*  HCT 36.0* 36.4*  PLT 374 380    BMET   Recent Labs  08/20/13 0528 08/21/13 0400  NA 140 137  K 3.5 3.5  CL 99 98  CO2 34* 33*  GLUCOSE 107* 107*  BUN 12 12  CREATININE 1.06 1.09  CALCIUM 8.9 9.0    PT/INR  No results found for this basename: LABPROT, INR,  in the last 72 hours  ABG  No results found for this basename: PHART, PCO2, PO2, HCO3,  in the last 72 hours   Studies/Results:  No results found.   Anti-infectives:   Anti-infectives   Start     Dose/Rate Route Frequency Ordered Stop   08/16/13  0800  vancomycin (VANCOCIN) 2,000 mg in sodium chloride 0.9 % 500 mL IVPB     2,000 mg 250 mL/hr over 120 Minutes Intravenous Every 24 hours 08/15/13 1411     08/15/13 1500  vancomycin (VANCOCIN) 2,000 mg in sodium chloride 0.9 % 500 mL IVPB     2,000 mg 250 mL/hr over 120 Minutes Intravenous  Once 08/15/13 1411 08/15/13 1713   08/14/13 1600  piperacillin-tazobactam (ZOSYN) IVPB 3.375 g     3.375 g 12.5 mL/hr over 240 Minutes Intravenous Every 8 hours 08/14/13 1543        Alphonsa Overall, MD, FACS Pager: [facility] Office: [phone] 08/21/2013

## 2013-08-21 NOTE — Progress Notes (Signed)
ANTIBIOTIC CONSULT NOTE   Pharmacy Consult for Zosyn/vancomycin Indication: intra-abdominal infection  No Known Allergies  Patient Measurements: Height: 6' (182.9 cm) Weight: 339 lb 1.1 oz (153.8 kg) IBW/kg (Calculated) : 77.6  Vital Signs: Temp: 98.6 F (37 C) (11/15 0444) Temp src: Oral (11/15 0444) BP: 153/54 mmHg (11/15 0842) Pulse Rate: 60 (11/15 0842)  Labs:  Recent Labs  08/19/13 0435 08/20/13 0528 08/21/13 0400  WBC 11.2* 10.3 13.4*  HGB 12.1* 11.9* 12.3*  PLT 392 374 380  CREATININE 1.08 1.06 1.09   Vancomycin trough 08/18/13:   12.3  Medical History: Past Medical History  Diagnosis Date  . Sleep apnea   . Diabetes mellitus   . Hypertension   . Hyperlipidemia   . CHF (congestive heart failure)   . Hernia   . AAA (abdominal aortic aneurysm)/ 4.5 cm ascending per CT angio 08/01/13 08/12/2013  . Infected prosthetic mesh of abdominal wall with GIANT abscess s/p removal 08/14/2013 08/14/2013   Antibiotics and culture results: 11/8 >> Zosyn >>  11/9 >> vancomycin>> ==================== 11/5 urine >> NGF 11/7 urine >> NGF 11/7 blood x2>> NGTD 11/8: abdomen incision drainage: NGTD 11/9: C. Diff: negative 11/9: blood x 2: pending 11/9: sputum: ordered 11/9: urine: NGF  Goal vancomycin trough per previous notes: 10-64mcg/ml   Assessment: 62 y/o M presented with fluid collection / abscess within anterior abdominal wall.  On 08/14/13 underwent I&D with removal of infected prosthetic Gore-tex mesh and evacuation of 3L purulent fluid.    Now on D#7 Zosyn 3.375 grams IV q8h (extended-infusion, each dose over 4 hours) and D#7 Vancomycin 2 grams IV q24h of planned 2-3 weeks per ID  Afebrile, WBC up slightly  Vancomycin trough 12.3 (acceptable) on 11/12  SCr currently stable   Plan:  1. Continue Vancomycin 2 gm IV q24h 2. Continue Zosyn 3.375 gm IV q8h (extended infusion, each dose over 4 hours)   Adrian Saran, PharmD, BCPS Pager  [phone] 08/21/2013 10:10 AM

## 2013-08-21 NOTE — Progress Notes (Signed)
ANTICOAGULATION CONSULT NOTE - Follow Up Consult  Pharmacy Consult for Heparin Indication: atrial fibrillation  No Known Allergies  Patient Measurements: Height: 6' (182.9 cm) Weight: 339 lb 1.1 oz (153.8 kg) IBW/kg (Calculated) : 77.6 Heparin Dosing Weight:   Vital Signs: Temp: 98.6 F (37 C) (11/15 0444) Temp src: Oral (11/15 0444) BP: 131/48 mmHg (11/15 0444) Pulse Rate: 60 (11/15 0444)  Labs:  Recent Labs  08/19/13 0435  08/20/13 0528 08/20/13 1000 08/20/13 1710 08/21/13 0400  HGB 12.1*  --  11.9*  --   --  12.3*  HCT 36.7*  --  36.0*  --   --  36.4*  PLT 392  --  374  --   --  380  HEPARINUNFRC  --   < >  --  0.17* 0.22* 0.16*  CREATININE 1.08  --  1.06  --   --  1.09  < > = values in this interval not displayed.  Estimated Creatinine Clearance: 110.2 ml/min (by C-G formula based on Cr of 1.09).   Medications:  Infusions:  . sodium chloride 10 mL/hr (08/19/13 0010)  . [name] KitchenTPN (CLINIMIX-E) Adult 75 mL/hr at 08/20/13 1714   And  . fat emulsion 250 mL (08/20/13 1714)  . heparin      Assessment: Patient with low heparin level.  No issues per RN.  Goal of Therapy:  Heparin level 0.3-0.7 units/ml Monitor platelets by anticoagulation protocol: Yes   Plan:  Increase to 2700 units/hr heparin.  Recheck level at 1200.  Tyler Deis, Shea Stakes [place] 08/21/2013,6:02 AM

## 2013-08-21 NOTE — Progress Notes (Signed)
[facility] PROGRESS NOTE  Kurt Cross [id number] DOB: 07-Jun-1951 DOA: 08/11/2013 PCP: [name], MD  Assessment/Plan: #1 infected prosthetic Gore-Tex mesh with a giant abdominal abscess status post IandD and Gore-Tex mesh removal 08/14/2013  Patient is status post incision and drainage of abscess, Gore-Tex mesh removal, debridement of abdominal wall and peritoneum 08/14/2013. -empiric IV Vancomycin and Zosyn.  -Blood cultures--neg  -Continue wound care as directed by general surgery.  -now with EC fistula  -PICC and TNA per surgery  -abx per ID  -continue long acting oxycontin 10mg  q 12hrs for pain control  -Patient is asking for OxyIR only 2-3 times per day--I have educated the patient that he may ask for up to 6 times per day if pain remains uncontrolled -CT abdomen if WBC continues to rise #2 sepsis  Likely secondary to problem #1.  -Wound cultures prelim with GPC in pairs and clusters-->Coag Neg Staph  -Remains hemodynamically stable  #3 hypotension  -resolved  -secondary to problem #2.  -Blood pressure responded to IV fluids.  -Increase carvedilol to home dose #4 atrial fibrillation/atrial flutter  Likely secondary to problem #1.  -currently rate controlled. EKG showed flutter waves.  -Cardiac enzymes have been negative x3. 2-D echo with EF of 50-55%. Wall motion abnormalities cannot be excluded.  -CHADS 2 score is about 3.  -Continue Coreg for rate control.  -Eliquis was discontinued as patient went for emergent surgery secondary to problem #1. General surgery to advise on anticoagulation, eliquis, may be resumed.  -switch to Gideon Lovenox until surgery clears pt for Eliquis (pt did not want the frequent blood draws from the heparin gtt)  -scheduled for TEE cardioversion however in light of problem #1 this may be postponed. Will defer to cardiology.  - F/u with cardiology 2-3 weeks post discharge.  #5 right lower extremity pain  Secondary to fall. Pain is more in  the posterior thigh/hamstrings and posterior Likely musculoskeletal in nature. Patient with clinical improvement. Continue warm compresses. PT/OT. Follow  #6 leukocytosis/fever  Likely secondary to problem #1.  -resolvedCurrently afebrile.  -WBC up a little today  -Chest x-Zetina is negative for infiltrate. Patient with no respiratory symptoms.  #7 chronic combined systolic and diastolic CHF  -Initial CXR with volume overload. given 4 doses of lasix 40mg  IV, then back to home dose. Stable.  -Restart low-dose carvedilol  -restarted home po lasix am 11/13  -08/12/2013 echocardiogram shows EF 50-55%--EF improved compared to one year ago (35-40%)  #8 chronic kidney disease stage III  -Creatinine is trending back down. ACE inhibitor and Lasix had been discontinued.  -Plan to restart ACE inhibitor if renal function remains stable  #9 hyperlipidemia  Continue statin.  #10 4.5 cm ascending aortic aneurysm per CT scan of 08/01/2013  Stable. Asymptomatic. Continue risk factor modification with good blood pressure control. Will need outpatient followup with vascular surgery as outpatient.  #11 type 2 diabetes  Hemoglobin A1c was 9.5 on 08/02/2013.Continue Lantus 65 units daily. -Sliding scale insulin.  -CBGs controlled on TNA  Hypomagnesemia  -Replete  Family Communication: wife at beside  Disposition Plan: Home when cleared by surgery            Procedures/Studies: Ct Abdomen Pelvis Wo Contrast  08/14/2013   CLINICAL DATA:  Fever, leukocytosis, prior hernia repairs and appendectomy  EXAM: CT ABDOMEN AND PELVIS WITHOUT CONTRAST  TECHNIQUE: Multidetector CT imaging of the abdomen and pelvis was performed following the standard protocol without intravenous contrast.  COMPARISON:  None.  FINDINGS: Note:  Given body habitus, the entire anterior abdominal wall (including a portion of the abnormality) could not be included in the field of view despite two separate attempts.  Prior ventral hernia  mesh repair. Just anterior to the mesh, within the anterior abdominal wall, is an abdominal wall fluid collection/abscess measuring approximately 14.5 x 18.1 x 26.1 cm (incompletely visualized). Along the superior aspect of the fluid collection, gas extends to immediately beneath the skin surface (series 10/image 27). Bowel loops are displaced posteriorly by the large collection fluid without definite communication.  A small hernia containing fat and a loop of nondilated colon is present inferiorly (sagittal image 88), to the left of midline (series 10/image 81 and 86).  Minimal left basilar atelectasis.  Cardiomegaly. Coronary atherosclerosis. ICD leads, incompletely visualized.  Moderate hepatic steatosis.  Unenhanced spleen and pancreas are unremarkable.  Low-density nodular thickening of the bilateral adrenal glands, likely reflecting adrenal adenomas.  Gallbladder is notable for layering gallstone. No intrahepatic or extrahepatic ductal dilatation.  Kidneys are unremarkable. No renal calculi or hydronephrosis.  No evidence of bowel obstruction. Prior appendectomy.  Atherosclerotic calcifications of the abdominal aorta and branch vessels.  No abdominopelvic ascites.  No suspicious abdominopelvic lymphadenopathy.  Prostate is unremarkable.  Bladder is within normal limits.  Degenerative changes of the visualized thoracolumbar spine.  IMPRESSION: 14.5 x 18.1 x 26.1 cm fluid collection/abscess within the anterior abdominal wall, as described above, incompletely visualized. The collection appears just anterior to prior ventral hernia mesh.  Given the size and proximity to the skin surface, incision and drainage is suggested.  These results were called by telephone at the time of interpretation on 08/14/2013 at 3:22 PM to Dr. Irine Seal , who verbally acknowledged these results.   Electronically Signed   By: Julian Hy M.D.   On: 08/14/2013 15:28   Dg Chest 2 View  08/01/2013   CLINICAL DATA:  Chest  pain, shortness of breath.  EXAM: CHEST  2 VIEW  COMPARISON:  August 24, 2012.  FINDINGS: Stable cardiomegaly. Left-sided pacemaker is noted. No pleural effusion or pneumothorax is noted. Mild central pulmonary vascular congestion is noted which appears to be chronic. No acute pulmonary disease is noted.  IMPRESSION: No active cardiopulmonary disease.   Electronically Signed   By: Sabino Dick M.D.   On: 08/01/2013 17:06   Dg Hip Complete Right  08/11/2013   CLINICAL DATA:  Right posterior hip pain.  EXAM: RIGHT HIP - COMPLETE 2+ VIEW  COMPARISON:  None.  FINDINGS: There is no evidence of hip fracture or dislocation. There is no evidence of arthropathy or other focal bone abnormality.  IMPRESSION: No acute osseous injury of the right hip.   Electronically Signed   By: Kathreen Devoid   On: 08/11/2013 15:16   Ct Angio Chest Pe W/cm &/or Wo Cm  08/01/2013   CLINICAL DATA:  Shortness of Breath,  EXAM: CT ANGIOGRAPHY CHEST WITH CONTRAST  TECHNIQUE: Multidetector CT imaging of the chest was performed using the standard protocol during bolus administration of intravenous contrast. Multiplanar CT image reconstructions including MIPs were obtained to evaluate the vascular anatomy.  CONTRAST:  142mL OMNIPAQUE IOHEXOL 350 MG/ML SOLN  COMPARISON:  06/28/2012  FINDINGS: There is fairly good contrast opacification of the pulmonary artery branches with no convincing filling defects to suggest acute PE. There is good contrast opacification of the thoracic aorta with some scattered plaque, no evidence of dissection or stenosis. There is dilatation of the ascending aorta measuring up to 4.5 cm  transverse diameter, stable by my measurement since prior study. Bovine brachiocephalic arterial origin anatomy without proximal stenosis. No pleural or pericardial effusion. No hilar or mediastinal adenopathy. Transvenous pacing leads are noted. Minimal linear scarring or subsegmental atelectasis in both lower lobes. Lungs otherwise  clear. Visualized portions of upper abdomen unremarkable. Multiple bridging osteophytes throughout the mid and lower thoracic spine. Sternum intact.  Review of the MIP images confirms the above findings.  IMPRESSION: 1. 4.5 cm ascending aortic aneurysm without complicating features. 2. No evidence of acute pulmonary embolism or aortic dissection.   Electronically Signed   By: Arne Cleveland M.D.   On: 08/01/2013 19:55   Dg Chest Port 1 View  08/18/2013   CLINICAL DATA:  Confirm line placement  EXAM: PORTABLE CHEST - 1 VIEW  COMPARISON:  08/17/2013  FINDINGS: Two images were taken. The patient is in semi-upright position and is slightly rotated to the right. A right upper extremity PICC has been placed, the distal tip of which is in the mid superior vena cava. Left chest wall triple lead pacemaker appears stable. Cardiomegaly. There is diffuse pulmonary vascular congestion and similar appearance of pulmonary edema. No pneumothorax or pleural effusion is appreciated.  IMPRESSION: 1. Right upper extremity PICC terminates in the mid superior vena cava. 2. Stable congestive heart failure pattern.   Electronically Signed   By: Curlene Dolphin M.D.   On: 08/18/2013 21:39   Dg Chest Port 1 View  08/17/2013   CLINICAL DATA:  Respiratory failure  EXAM: PORTABLE CHEST - 1 VIEW  COMPARISON:  08/15/2013.  FINDINGS: Persistent cardiomegaly with pulmonary vascular prominence and interstitial prominence suggesting congestive heart failure with interstitial edema noted. Interstitial pneumonitis cannot be excluded. Cardiac pacer. Lead tips in stable position. No pneumothorax. No acute osseous abnormality.  IMPRESSION: Stable chest with changes of congestive heart failure with pulmonary interstitial edema. Cardiac pacer in stable position.   Electronically Signed   By: Marcello Moores  Register   On: 08/17/2013 07:41   Dg Chest Portable 1 View  08/15/2013   CLINICAL DATA:  Sepsis, respiratory distress  EXAM: PORTABLE CHEST - 1 VIEW   COMPARISON:  Prior radiograph from 08/13/2013  FINDINGS: Left-sided pacemaker is unchanged. Cardiomegaly is stable as compared to the prior exam.  Lungs are normally inflated. There has been worsening of pulmonary vascular congestion and edema as compared to the prior examination. No definite pleural effusion. No focal infiltrates appreciated. No pneumothorax.  Osseous structures are unchanged.  IMPRESSION: Stable cardiomegaly with interval worsening of pulmonary vascular congestion/ edema relative to 08/13/2013.   Electronically Signed   By: Jeannine Boga M.D.   On: 08/15/2013 01:16   Dg Chest Port 1 View  08/13/2013   CLINICAL DATA:  Leukocytosis.  EXAM: PORTABLE CHEST - 1 VIEW  COMPARISON:  08/01/2013  FINDINGS: No focal pulmonary consolidation is identified. There is stable mild pulmonary venous prominence without overt edema or visualized pleural fluid. The heart remains mildly enlarged. There is stable appearance of a biventricular pacemaker.  IMPRESSION: No acute findings. Stable cardiomegaly and pulmonary venous prominence without overt edema.   Electronically Signed   By: Aletta Edouard M.D.   On: 08/13/2013 15:44         Subjective: Patient is rotated from the frequent blood draws from heparin. States that his abdominal pain is worse. Denies fevers, chills, chest pain, shortness breath, nausea, vomiting, diarrhea,   Objective: Filed Vitals:   08/20/13 1318 08/20/13 2213 08/21/13 0444 08/21/13 0842  BP: 152/81 135/66  131/48 153/54  Pulse: 60 62 60 60  Temp: 98.2 F (36.8 C) 98.5 F (36.9 C) 98.6 F (37 C)   TempSrc: Oral Oral Oral   Resp: 16 20 20    Height:      Weight:   153.8 kg (339 lb 1.1 oz)   SpO2: 98% 98% 97%     Intake/Output Summary (Last 24 hours) at 08/21/13 1352 Last data filed at 08/21/13 1058  Gross per 24 hour  Intake      0 ml  Output   1750 ml  Net  -1750 ml   Weight change: -1.7 kg (-3 lb 12 oz) Exam:   General:  Pt is alert, follows  commands appropriately, not in acute distress  HEENT: No icterus, No thrush, [place]/AT  Cardiovascular: RRR, S1/S2, no rubs, no gallops  Respiratory: CTA bilaterally, no wheezing, no crackles, no rhonchi Abdomen: Soft/+BS, non tender, non distended, no guarding;large ventral wound with ostomy bag; scattered patches of light erythema--no necrosis, no crepitance   Extremities: No edema, No lymphangitis, No petechiae, No rashes, no synovitis  Data Reviewed: Basic Metabolic Panel:  Recent Labs Lab 08/16/13 0342 08/17/13 0325 08/18/13 0645 08/19/13 0435 08/20/13 0528 08/21/13 0400  NA 132* 134* 136 138 140 137  K 4.1 4.4 3.9 3.8 3.5 3.5  CL 97 99 101 101 99 98  CO2 26 26 28 30  34* 33*  GLUCOSE 214* 202* 116* 103* 107* 107*  BUN 40* 30* 18 14 12 12   CREATININE 2.03* 1.48* 1.04 1.08 1.06 1.09  CALCIUM 8.9 9.1 9.0 9.2 8.9 9.0  MG 1.9  --   --  1.5 1.4* 1.7  PHOS  --   --   --  3.9 3.7  --    Liver Function Tests:  Recent Labs Lab 08/15/13 0049 08/19/13 0435  AST 53* 36  ALT 27 21  ALKPHOS 40 37*  BILITOT 1.1 0.5  PROT 6.3 6.8  ALBUMIN 1.9* 2.0*   No results found for this basename: LIPASE, AMYLASE,  in the last 168 hours No results found for this basename: AMMONIA,  in the last 168 hours CBC:  Recent Labs Lab 08/16/13 0342 08/17/13 0325 08/18/13 0645 08/19/13 0435 08/20/13 0528 08/21/13 0400  WBC 15.3* 12.3* 11.4* 11.2* 10.3 13.4*  NEUTROABS 11.9* 9.4* 8.5* 7.9*  --   --   HGB 11.2* 12.1* 12.4* 12.1* 11.9* 12.3*  HCT 33.5* 36.7* 37.3* 36.7* 36.0* 36.4*  MCV 96.5 97.1 96.6 97.3 97.8 96.6  PLT 312 366 402* 392 374 380   Cardiac Enzymes:  Recent Labs Lab 08/15/13 0049  TROPONINI <0.30   BNP: No components found with this basename: POCBNP,  CBG:  Recent Labs Lab 08/20/13 1949 08/21/13 0044 08/21/13 0442 08/21/13 0750 08/21/13 1144  GLUCAP 136* 159* 115* 136* 159*    Recent Results (from the past 240 hour(s))  URINE CULTURE     Status: None    Collection Time    08/11/13  6:24 PM      Result Value Range Status   Specimen Description URINE, CLEAN CATCH   Final   Special Requests NONE   Final   Culture  Setup Time     Final   Value: 08/12/2013 02:25     Performed at [facility] Count     Final   Value: NO GROWTH     Performed at [facility]   Culture     Final   Value: NO GROWTH  Performed at [facility]   Report Status 08/13/2013 FINAL   Final  CULTURE, BLOOD (ROUTINE X 2)     Status: None   Collection Time    08/13/13  2:55 PM      Result Value Range Status   Specimen Description BLOOD LEFT HAND   Final   Special Requests BOTTLES DRAWN AEROBIC ONLY 10CC   Final   Culture  Setup Time     Final   Value: 08/13/2013 22:30     Performed at [facility]   Culture     Final   Value: NO GROWTH 5 DAYS     Performed at [facility]   Report Status 08/19/2013 FINAL   Final  CULTURE, BLOOD (ROUTINE X 2)     Status: None   Collection Time    08/13/13  3:00 PM      Result Value Range Status   Specimen Description BLOOD LEFT ARM   Final   Special Requests BOTTLES DRAWN AEROBIC AND ANAEROBIC 10CC   Final   Culture  Setup Time     Final   Value: 08/13/2013 22:31     Performed at [facility]   Culture     Final   Value: NO GROWTH 5 DAYS     Performed at [facility]   Report Status 08/19/2013 FINAL   Final  URINE CULTURE     Status: None   Collection Time    08/13/13  3:11 PM      Result Value Range Status   Specimen Description URINE, CLEAN CATCH   Final   Special Requests NONE   Final   Culture  Setup Time     Final   Value: 08/14/2013 06:01     Performed at [place] [facility]     Final   Value: NO GROWTH     Performed at [facility]   Culture     Final   Value: NO GROWTH     Performed at [facility]   Report Status 08/15/2013 FINAL   Final  SURGICAL PCR SCREEN     Status: None   Collection Time    08/14/13   4:35 PM      Result Value Range Status   MRSA, PCR NEGATIVE  NEGATIVE Final   Staphylococcus aureus NEGATIVE  NEGATIVE Final   Comment:            The Xpert SA Assay (FDA     approved for NASAL specimens     in patients over 58 years of age),     is one component of     a comprehensive surveillance     program.  Test performance has     been validated by [employer] for patients greater     than or equal to 12 year old.     It is not intended     to diagnose infection nor to     guide or monitor treatment.  CULTURE, ROUTINE-ABSCESS     Status: None   Collection Time    08/14/13  6:28 PM      Result Value Range Status   Specimen Description ABDOMEN INCISION DRAINAGE   Final   Special Requests NONE   Final   Gram Stain     Final   Value: ABUNDANT WBC PRESENT, PREDOMINANTLY PMN     NO SQUAMOUS EPITHELIAL CELLS SEEN  MODERATE GRAM POSITIVE COCCI IN PAIRS     IN CLUSTERS     Performed at [facility]   Culture     Final   Value: RARE STAPHYLOCOCCUS SPECIES (COAGULASE NEGATIVE)     Performed at [facility]   Report Status 08/19/2013 FINAL   Final  ANAEROBIC CULTURE     Status: None   Collection Time    08/14/13  6:28 PM      Result Value Range Status   Specimen Description ABDOMEN INCISION DRAINAGE   Final   Special Requests NONE   Final   Gram Stain     Final   Value: ABUNDANT WBC PRESENT, PREDOMINANTLY PMN     NO SQUAMOUS EPITHELIAL CELLS SEEN     FEW GRAM POSITIVE COCCI     IN PAIRS IN CLUSTERS     Performed at [facility]   Culture     Final   Value: NO ANAEROBES ISOLATED     Performed at [facility]   Report Status 08/20/2013 FINAL   Final  CULTURE, BLOOD (ROUTINE X 2)     Status: None   Collection Time    08/15/13 12:40 AM      Result Value Range Status   Specimen Description BLOOD LEFT ARM   Final   Special Requests BOTTLES DRAWN AEROBIC AND ANAEROBIC 5CC   Final   Culture  Setup Time     Final   Value: 08/15/2013  13:18     Performed at [facility]   Culture     Final   Value:        BLOOD CULTURE RECEIVED NO GROWTH TO DATE CULTURE WILL BE HELD FOR 5 DAYS BEFORE ISSUING A FINAL NEGATIVE REPORT     Performed at [facility]   Report Status PENDING   Incomplete  CULTURE, BLOOD (ROUTINE X 2)     Status: None   Collection Time    08/15/13 12:50 AM      Result Value Range Status   Specimen Description BLOOD LEFT ARM   Final   Special Requests BOTTLES DRAWN AEROBIC AND ANAEROBIC 5CC   Final   Culture  Setup Time     Final   Value: 08/15/2013 13:18     Performed at [facility]   Culture     Final   Value:        BLOOD CULTURE RECEIVED NO GROWTH TO DATE CULTURE WILL BE HELD FOR 5 DAYS BEFORE ISSUING A FINAL NEGATIVE REPORT     Performed at [facility]   Report Status PENDING   Incomplete  URINE CULTURE     Status: None   Collection Time    08/15/13  2:37 AM      Result Value Range Status   Specimen Description URINE, CATHETERIZED   Final   Special Requests NONE   Final   Culture  Setup Time     Final   Value: 08/15/2013 16:08     Performed at [facility]   Culture     Final   Value: NO GROWTH     Performed at [facility]   Report Status 08/17/2013 FINAL   Final  CLOSTRIDIUM DIFFICILE BY PCR     Status: None   Collection Time    08/15/13  9:04 AM      Result Value Range Status   C difficile by pcr NEGATIVE  NEGATIVE Final   Comment:  Performed at [facility]     Scheduled Meds: . acetaminophen  1,000 mg Oral TID  . atorvastatin  40 mg Oral QODAY  . carvedilol  6.25 mg Oral BID WC  . enoxaparin (LOVENOX) injection  150 mg Subcutaneous Once  . [START ON 08/22/2013] enoxaparin (LOVENOX) injection  150 mg Subcutaneous Q12H  . furosemide  80 mg Oral BID  . insulin aspart  0-20 Units Subcutaneous Q4H  . insulin glargine  65 Units Subcutaneous QHS  . lip balm  1 application Topical BID  . OxyCODONE  10 mg Oral Q12H  .  piperacillin-tazobactam (ZOSYN)  IV  3.375 g Intravenous Q8H  . psyllium  1 packet Oral Daily  . saccharomyces boulardii  250 mg Oral BID  . sodium chloride  3 mL Intravenous Q12H  . vancomycin  2,000 mg Intravenous Q24H   Continuous Infusions: . sodium chloride 10 mL/hr (08/19/13 0010)  . [name] KitchenTPN (CLINIMIX-E) Adult 75 mL/hr at 08/20/13 1714   And  . fat emulsion 250 mL (08/20/13 1714)  . [name] KitchenTPN (CLINIMIX-E) Adult     And  . fat emulsion       Shariq Puig, DO  [facility] Pager [phone]  If 7PM-7AM, please contact night-coverage [email or web address] Password [email or web address] 08/21/2013, 1:52 PM   LOS: 10 days

## 2013-08-22 LAB — COMPREHENSIVE METABOLIC PANEL
ALT: 12 U/L (ref 0–53)
Albumin: 2.1 g/dL — ABNORMAL LOW (ref 3.5–5.2)
Alkaline Phosphatase: 28 U/L — ABNORMAL LOW (ref 39–117)
BUN: 15 mg/dL (ref 6–23)
CO2: 34 mEq/L — ABNORMAL HIGH (ref 19–32)
Chloride: 97 mEq/L (ref 96–112)
GFR calc non Af Amer: 65 mL/min — ABNORMAL LOW (ref >90)
Glucose, Bld: 179 mg/dL — ABNORMAL HIGH (ref 70–99)
Potassium: 3.5 mEq/L (ref 3.5–5.1)
Sodium: 137 mEq/L (ref 135–145)
Total Protein: 6.8 g/dL (ref 6.0–8.3)

## 2013-08-22 LAB — GLUCOSE, CAPILLARY
Glucose-Capillary: 158 mg/dL — ABNORMAL HIGH (ref 70–99)
Glucose-Capillary: 211 mg/dL — ABNORMAL HIGH (ref 70–99)

## 2013-08-22 LAB — CBC
Hemoglobin: 12 g/dL — ABNORMAL LOW (ref 13.0–17.0)
MCHC: 33.1 g/dL (ref 30.0–36.0)
MCV: 96.5 fL (ref 78.0–100.0)
RBC: 3.75 MIL/uL — ABNORMAL LOW (ref 4.22–5.81)
RDW: 14.3 % (ref 11.5–15.5)
WBC: 12.1 10*3/uL — ABNORMAL HIGH (ref 4.0–10.5)

## 2013-08-22 LAB — MAGNESIUM: Magnesium: 1.7 mg/dL (ref 1.5–2.5)

## 2013-08-22 MED ORDER — TRACE MINERALS CR-CU-F-FE-I-MN-MO-SE-ZN IV SOLN
INTRAVENOUS | Status: AC
  Administered 2013-08-22: 18:00:00 via INTRAVENOUS
  Filled 2013-08-22: qty 3000

## 2013-08-22 MED ORDER — FAT EMULSION 20 % IV EMUL
250.0000 mL | INTRAVENOUS | Status: AC
  Administered 2013-08-22: 18:00:00 250 mL via INTRAVENOUS
  Filled 2013-08-22: qty 250

## 2013-08-22 NOTE — Progress Notes (Signed)
[facility] PROGRESS NOTE  Kurt Cross [id number] DOB: [birth year] DOA: 08/11/2013 PCP: [name], MD  Assessment/Plan: #1 infected prosthetic Gore-Tex mesh with a giant abdominal abscess status post IandD and Gore-Tex mesh removal 08/14/2013  Patient is status post incision and drainage of abscess, Gore-Tex mesh removal, debridement of abdominal wall and peritoneum 08/14/2013. -empiric IV Vancomycin and Zosyn.  -Blood cultures--neg  -Continue wound care as directed by general surgery.  -now with EC fistula  -PICC and TNA per surgery  -abx per ID  -continue long acting oxycontin 10mg  q 12hrs for pain control  -Patient is asking for OxyIR only 2-3 times per day--I have educated the patient that he may ask for up to 6 times per day if pain remains uncontrolled  -CT abdomen if WBC continues to rise  #2 sepsis  -secondary to problem #1.  -Wound cultures prelim with GPC in pairs and clusters-->Coag Neg Staph  -Remains hemodynamically stable  #3 hypotension  -resolved  -secondary to problem #2.  -Blood pressure responded to IV fluids.  -Increase carvedilol to home dose  #4 atrial fibrillation/atrial flutter  Likely secondary to problem #1.  -currently rate controlled. EKG showed flutter waves.  -Cardiac enzymes have been negative x3. 2-D echo with EF of 50-55%. Wall motion abnormalities cannot be excluded.  -CHADS 2 score is about 3.  -Continue Coreg for rate control.  -Eliquis was discontinued as patient went for emergent surgery secondary to problem #1. General surgery to advise on anticoagulation, eliquis, may be resumed.  -switch to [place] Lovenox until surgery clears pt for Eliquis (pt did not want the frequent blood draws from the heparin gtt)  -scheduled for TEE cardioversion however in light of problem #1 this may be postponed. Will defer to cardiology.  - F/u with cardiology 2-3 weeks post discharge.  #5 right lower extremity pain  Secondary to fall. Pain is more in the  posterior thigh/hamstrings and posterior Likely musculoskeletal in nature. Patient with clinical improvement. Continue warm compresses. PT/OT. Follow  #6 leukocytosis/fever  -secondary to problem #1.  -resolvedCurrently afebrile.  -WBC up a little today  -Chest x-Krauser is negative for infiltrate. Patient with no respiratory symptoms.  #7 chronic combined systolic and diastolic CHF  -Initial CXR with volume overload. given 4 doses of lasix 40mg  IV, then back to home dose. Stable.  -Restart low-dose carvedilol  -restarted home po lasix am 11/13  -08/12/2013 echocardiogram shows EF 50-55%--EF improved compared to one year ago (35-40%)  #8 chronic kidney disease stage III  -Creatinine is trending back down. ACE inhibitor had been discontinued.   -Plan to restart ACE inhibitor if renal function remains stable  #9 hyperlipidemia  Continue statin.  #10 4.5 cm ascending aortic aneurysm per CT scan of 08/01/2013  Stable. Asymptomatic. Continue risk factor modification with good blood pressure control. Will need outpatient followup with vascular surgery as outpatient.  #11 type 2 diabetes  Hemoglobin A1c was 9.5 on 08/02/2013.Continue Lantus 65 units daily. -Sliding scale insulin.  -CBGs slowly creeping up with TNA  Hypomagnesemia  -Replete  Family Communication: wife at beside  Disposition Plan: Home when cleared by surgery          Procedures/Studies: Ct Abdomen Pelvis Wo Contrast  08/14/2013   CLINICAL DATA:  Fever, leukocytosis, prior hernia repairs and appendectomy  EXAM: CT ABDOMEN AND PELVIS WITHOUT CONTRAST  TECHNIQUE: Multidetector CT imaging of the abdomen and pelvis was performed following the standard protocol without intravenous contrast.  COMPARISON:  None.  FINDINGS: Note:  Given body habitus, the entire anterior abdominal wall (including a portion of the abnormality) could not be included in the field of view despite two separate attempts.  Prior ventral hernia mesh repair.  Just anterior to the mesh, within the anterior abdominal wall, is an abdominal wall fluid collection/abscess measuring approximately 14.5 x 18.1 x 26.1 cm (incompletely visualized). Along the superior aspect of the fluid collection, gas extends to immediately beneath the skin surface (series 10/image 27). Bowel loops are displaced posteriorly by the large collection fluid without definite communication.  A small hernia containing fat and a loop of nondilated colon is present inferiorly (sagittal image 88), to the left of midline (series 10/image 81 and 86).  Minimal left basilar atelectasis.  Cardiomegaly. Coronary atherosclerosis. ICD leads, incompletely visualized.  Moderate hepatic steatosis.  Unenhanced spleen and pancreas are unremarkable.  Low-density nodular thickening of the bilateral adrenal glands, likely reflecting adrenal adenomas.  Gallbladder is notable for layering gallstone. No intrahepatic or extrahepatic ductal dilatation.  Kidneys are unremarkable. No renal calculi or hydronephrosis.  No evidence of bowel obstruction. Prior appendectomy.  Atherosclerotic calcifications of the abdominal aorta and branch vessels.  No abdominopelvic ascites.  No suspicious abdominopelvic lymphadenopathy.  Prostate is unremarkable.  Bladder is within normal limits.  Degenerative changes of the visualized thoracolumbar spine.  IMPRESSION: 14.5 x 18.1 x 26.1 cm fluid collection/abscess within the anterior abdominal wall, as described above, incompletely visualized. The collection appears just anterior to prior ventral hernia mesh.  Given the size and proximity to the skin surface, incision and drainage is suggested.  These results were called by telephone at the time of interpretation on 08/14/2013 at 3:22 PM to Dr. Irine Seal , who verbally acknowledged these results.   Electronically Signed   By: Julian Hy M.D.   On: 08/14/2013 15:28   Dg Chest 2 View  08/01/2013   CLINICAL DATA:  Chest pain, shortness  of breath.  EXAM: CHEST  2 VIEW  COMPARISON:  August 24, 2012.  FINDINGS: Stable cardiomegaly. Left-sided pacemaker is noted. No pleural effusion or pneumothorax is noted. Mild central pulmonary vascular congestion is noted which appears to be chronic. No acute pulmonary disease is noted.  IMPRESSION: No active cardiopulmonary disease.   Electronically Signed   By: Sabino Dick M.D.   On: 08/01/2013 17:06   Dg Hip Complete Right  08/11/2013   CLINICAL DATA:  Right posterior hip pain.  EXAM: RIGHT HIP - COMPLETE 2+ VIEW  COMPARISON:  None.  FINDINGS: There is no evidence of hip fracture or dislocation. There is no evidence of arthropathy or other focal bone abnormality.  IMPRESSION: No acute osseous injury of the right hip.   Electronically Signed   By: Kathreen Devoid   On: 08/11/2013 15:16   Ct Angio Chest Pe W/cm &/or Wo Cm  08/01/2013   CLINICAL DATA:  Shortness of Breath,  EXAM: CT ANGIOGRAPHY CHEST WITH CONTRAST  TECHNIQUE: Multidetector CT imaging of the chest was performed using the standard protocol during bolus administration of intravenous contrast. Multiplanar CT image reconstructions including MIPs were obtained to evaluate the vascular anatomy.  CONTRAST:  148mL OMNIPAQUE IOHEXOL 350 MG/ML SOLN  COMPARISON:  06/28/2012  FINDINGS: There is fairly good contrast opacification of the pulmonary artery branches with no convincing filling defects to suggest acute PE. There is good contrast opacification of the thoracic aorta with some scattered plaque, no evidence of dissection or stenosis. There is dilatation of the ascending aorta measuring up to 4.5 cm  transverse diameter, stable by my measurement since prior study. Bovine brachiocephalic arterial origin anatomy without proximal stenosis. No pleural or pericardial effusion. No hilar or mediastinal adenopathy. Transvenous pacing leads are noted. Minimal linear scarring or subsegmental atelectasis in both lower lobes. Lungs otherwise clear. Visualized  portions of upper abdomen unremarkable. Multiple bridging osteophytes throughout the mid and lower thoracic spine. Sternum intact.  Review of the MIP images confirms the above findings.  IMPRESSION: 1. 4.5 cm ascending aortic aneurysm without complicating features. 2. No evidence of acute pulmonary embolism or aortic dissection.   Electronically Signed   By: Arne Cleveland M.D.   On: 08/01/2013 19:55   Dg Chest Port 1 View  08/18/2013   CLINICAL DATA:  Confirm line placement  EXAM: PORTABLE CHEST - 1 VIEW  COMPARISON:  08/17/2013  FINDINGS: Two images were taken. The patient is in semi-upright position and is slightly rotated to the right. A right upper extremity PICC has been placed, the distal tip of which is in the mid superior vena cava. Left chest wall triple lead pacemaker appears stable. Cardiomegaly. There is diffuse pulmonary vascular congestion and similar appearance of pulmonary edema. No pneumothorax or pleural effusion is appreciated.  IMPRESSION: 1. Right upper extremity PICC terminates in the mid superior vena cava. 2. Stable congestive heart failure pattern.   Electronically Signed   By: Curlene Dolphin M.D.   On: 08/18/2013 21:39   Dg Chest Port 1 View  08/17/2013   CLINICAL DATA:  Respiratory failure  EXAM: PORTABLE CHEST - 1 VIEW  COMPARISON:  08/15/2013.  FINDINGS: Persistent cardiomegaly with pulmonary vascular prominence and interstitial prominence suggesting congestive heart failure with interstitial edema noted. Interstitial pneumonitis cannot be excluded. Cardiac pacer. Lead tips in stable position. No pneumothorax. No acute osseous abnormality.  IMPRESSION: Stable chest with changes of congestive heart failure with pulmonary interstitial edema. Cardiac pacer in stable position.   Electronically Signed   By: Marcello Moores  Register   On: 08/17/2013 07:41   Dg Chest Portable 1 View  08/15/2013   CLINICAL DATA:  Sepsis, respiratory distress  EXAM: PORTABLE CHEST - 1 VIEW  COMPARISON:  Prior  radiograph from 08/13/2013  FINDINGS: Left-sided pacemaker is unchanged. Cardiomegaly is stable as compared to the prior exam.  Lungs are normally inflated. There has been worsening of pulmonary vascular congestion and edema as compared to the prior examination. No definite pleural effusion. No focal infiltrates appreciated. No pneumothorax.  Osseous structures are unchanged.  IMPRESSION: Stable cardiomegaly with interval worsening of pulmonary vascular congestion/ edema relative to 08/13/2013.   Electronically Signed   By: Jeannine Boga M.D.   On: 08/15/2013 01:16   Dg Chest Port 1 View  08/13/2013   CLINICAL DATA:  Leukocytosis.  EXAM: PORTABLE CHEST - 1 VIEW  COMPARISON:  08/01/2013  FINDINGS: No focal pulmonary consolidation is identified. There is stable mild pulmonary venous prominence without overt edema or visualized pleural fluid. The heart remains mildly enlarged. There is stable appearance of a biventricular pacemaker.  IMPRESSION: No acute findings. Stable cardiomegaly and pulmonary venous prominence without overt edema.   Electronically Signed   By: Aletta Edouard M.D.   On: 08/13/2013 15:44         Subjective: Patient denies fevers, chills, chest discomfort, shortness breath, nausea, vomiting, diarrhea. He complaints of abdominal pain. No dysuria. No headache or dizziness.  Objective: Filed Vitals:   08/21/13 1454 08/21/13 1954 08/22/13 0505 08/22/13 1312  BP: 134/67 137/64 134/70 140/63  Pulse: 66 72  65 58  Temp: 98.3 F (36.8 C) 98.4 F (36.9 C) 98.5 F (36.9 C) 98.4 F (36.9 C)  TempSrc: Oral Oral Oral Oral  Resp: 20 20 18 18   Height:      Weight:   151 kg (332 lb 14.3 oz)   SpO2: 93% 96% 99% 95%    Intake/Output Summary (Last 24 hours) at 08/22/13 1820 Last data filed at 08/22/13 1748  Gross per 24 hour  Intake 4121.01 ml  Output   1957 ml  Net 2164.01 ml   Weight change: -2.8 kg (-6 lb 2.8 oz) Exam:   General:  Pt is alert, follows commands  appropriately, not in acute distress  HEENT: No icterus, No thrush, [place]/AT  Cardiovascular: RRR, S1/S2, no rubs, no gallops  Respiratory: CTA bilaterally, no wheezing, no crackles, no rhonchi Abdomen: Soft/+BS, non tender, non distended, no guarding;;large ventral wound with ostomy bag; no erythema--no necrosis, no crepitance    Extremities: 1+LE edema, No lymphangitis, No petechiae, No rashes, no synovitis  Data Reviewed: Basic Metabolic Panel:  Recent Labs Lab 08/16/13 0342  08/18/13 0645 08/19/13 0435 08/20/13 0528 08/21/13 0400 08/22/13 0500  NA 132*  < > 136 138 140 137 137  K 4.1  < > 3.9 3.8 3.5 3.5 3.5  CL 97  < > 101 101 99 98 97  CO2 26  < > 28 30 34* 33* 34*  GLUCOSE 214*  < > 116* 103* 107* 107* 179*  BUN 40*  < > 18 14 12 12 15   CREATININE 2.03*  < > 1.04 1.08 1.06 1.09 1.19  CALCIUM 8.9  < > 9.0 9.2 8.9 9.0 9.3  MG 1.9  --   --  1.5 1.4* 1.7 1.7  PHOS  --   --   --  3.9 3.7  --  4.2  < > = values in this interval not displayed. Liver Function Tests:  Recent Labs Lab 08/19/13 0435 08/22/13 0500  AST 36 19  ALT 21 12  ALKPHOS 37* 28*  BILITOT 0.5 0.4  PROT 6.8 6.8  ALBUMIN 2.0* 2.1*   No results found for this basename: LIPASE, AMYLASE,  in the last 168 hours No results found for this basename: AMMONIA,  in the last 168 hours CBC:  Recent Labs Lab 08/16/13 0342 08/17/13 0325 08/18/13 0645 08/19/13 0435 08/20/13 0528 08/21/13 0400 08/22/13 0500  WBC 15.3* 12.3* 11.4* 11.2* 10.3 13.4* 12.1*  NEUTROABS 11.9* 9.4* 8.5* 7.9*  --   --   --   HGB 11.2* 12.1* 12.4* 12.1* 11.9* 12.3* 12.0*  HCT 33.5* 36.7* 37.3* 36.7* 36.0* 36.4* 36.2*  MCV 96.5 97.1 96.6 97.3 97.8 96.6 96.5  PLT 312 366 402* 392 374 380 365   Cardiac Enzymes: No results found for this basename: CKTOTAL, CKMB, CKMBINDEX, TROPONINI,  in the last 168 hours BNP: No components found with this basename: POCBNP,  CBG:  Recent Labs Lab 08/22/13 0007 08/22/13 0359 08/22/13 0715  08/22/13 1158 08/22/13 1702  GLUCAP 206* 179* 179* 196* 221*    Recent Results (from the past 240 hour(s))  CULTURE, BLOOD (ROUTINE X 2)     Status: None   Collection Time    08/13/13  2:55 PM      Result Value Range Status   Specimen Description BLOOD LEFT HAND   Final   Special Requests BOTTLES DRAWN AEROBIC ONLY 10CC   Final   Culture  Setup Time     Final   Value: 08/13/2013 22:30  Performed at [facility]     Final   Value: NO GROWTH 5 DAYS     Performed at [facility]   Report Status 08/19/2013 FINAL   Final  CULTURE, BLOOD (ROUTINE X 2)     Status: None   Collection Time    08/13/13  3:00 PM      Result Value Range Status   Specimen Description BLOOD LEFT ARM   Final   Special Requests BOTTLES DRAWN AEROBIC AND ANAEROBIC 10CC   Final   Culture  Setup Time     Final   Value: 08/13/2013 22:31     Performed at [facility]   Culture     Final   Value: NO GROWTH 5 DAYS     Performed at [facility]   Report Status 08/19/2013 FINAL   Final  URINE CULTURE     Status: None   Collection Time    08/13/13  3:11 PM      Result Value Range Status   Specimen Description URINE, CLEAN CATCH   Final   Special Requests NONE   Final   Culture  Setup Time     Final   Value: 08/14/2013 06:01     Performed at [facility]     Final   Value: NO GROWTH     Performed at [facility]   Culture     Final   Value: NO GROWTH     Performed at [facility]   Report Status 08/15/2013 FINAL   Final  SURGICAL PCR SCREEN     Status: None   Collection Time    08/14/13  4:35 PM      Result Value Range Status   MRSA, PCR NEGATIVE  NEGATIVE Final   Staphylococcus aureus NEGATIVE  NEGATIVE Final   Comment:            The Xpert SA Assay (FDA     approved for NASAL specimens     in patients over 69 years of age),     is one component of     a comprehensive surveillance     program.  Test performance has      been validated by [employer] for patients greater     than or equal to 85 year old.     It is not intended     to diagnose infection nor to     guide or monitor treatment.  CULTURE, ROUTINE-ABSCESS     Status: None   Collection Time    08/14/13  6:28 PM      Result Value Range Status   Specimen Description ABDOMEN INCISION DRAINAGE   Final   Special Requests NONE   Final   Gram Stain     Final   Value: ABUNDANT WBC PRESENT, PREDOMINANTLY PMN     NO SQUAMOUS EPITHELIAL CELLS SEEN     MODERATE GRAM POSITIVE COCCI IN PAIRS     IN CLUSTERS     Performed at [facility]   Culture     Final   Value: RARE STAPHYLOCOCCUS SPECIES (COAGULASE NEGATIVE)     Performed at [facility]   Report Status 08/19/2013 FINAL   Final  ANAEROBIC CULTURE     Status: None   Collection Time    08/14/13  6:28 PM      Result Value Range Status  Specimen Description ABDOMEN INCISION DRAINAGE   Final   Special Requests NONE   Final   Gram Stain     Final   Value: ABUNDANT WBC PRESENT, PREDOMINANTLY PMN     NO SQUAMOUS EPITHELIAL CELLS SEEN     FEW GRAM POSITIVE COCCI     IN PAIRS IN CLUSTERS     Performed at [facility]   Culture     Final   Value: NO ANAEROBES ISOLATED     Performed at [facility]   Report Status 08/20/2013 FINAL   Final  CULTURE, BLOOD (ROUTINE X 2)     Status: None   Collection Time    08/15/13 12:40 AM      Result Value Range Status   Specimen Description BLOOD LEFT ARM   Final   Special Requests BOTTLES DRAWN AEROBIC AND ANAEROBIC 5CC   Final   Culture  Setup Time     Final   Value: 08/15/2013 13:18     Performed at [facility]   Culture     Final   Value: NO GROWTH 5 DAYS     Performed at [facility]   Report Status 08/21/2013 FINAL   Final  CULTURE, BLOOD (ROUTINE X 2)     Status: None   Collection Time    08/15/13 12:50 AM      Result Value Range Status   Specimen Description BLOOD LEFT ARM   Final    Special Requests BOTTLES DRAWN AEROBIC AND ANAEROBIC 5CC   Final   Culture  Setup Time     Final   Value: 08/15/2013 13:18     Performed at [facility]   Culture     Final   Value: NO GROWTH 5 DAYS     Performed at [facility]   Report Status 08/21/2013 FINAL   Final  URINE CULTURE     Status: None   Collection Time    08/15/13  2:37 AM      Result Value Range Status   Specimen Description URINE, CATHETERIZED   Final   Special Requests NONE   Final   Culture  Setup Time     Final   Value: 08/15/2013 16:08     Performed at [facility]   Culture     Final   Value: NO GROWTH     Performed at [facility]   Report Status 08/17/2013 FINAL   Final  CLOSTRIDIUM DIFFICILE BY PCR     Status: None   Collection Time    08/15/13  9:04 AM      Result Value Range Status   C difficile by pcr NEGATIVE  NEGATIVE Final   Comment: Performed at [facility]     Scheduled Meds: . acetaminophen  1,000 mg Oral TID  . atorvastatin  40 mg Oral QODAY  . carvedilol  6.25 mg Oral BID WC  . enoxaparin (LOVENOX) injection  150 mg Subcutaneous Q12H  . furosemide  80 mg Oral BID  . insulin aspart  0-20 Units Subcutaneous Q4H  . insulin glargine  65 Units Subcutaneous QHS  . lip balm  1 application Topical BID  . OxyCODONE  10 mg Oral Q12H  . piperacillin-tazobactam (ZOSYN)  IV  3.375 g Intravenous Q8H  . psyllium  1 packet Oral Daily  . saccharomyces boulardii  250 mg Oral BID  . sodium chloride  3 mL Intravenous Q12H  . vancomycin  2,000  mg Intravenous Q24H   Continuous Infusions: . sodium chloride 10 mL/hr (08/19/13 0010)  . [name] KitchenTPN (CLINIMIX-E) Adult 125 mL/hr at 08/22/13 1730   And  . fat emulsion 250 mL (08/22/13 1730)     Izreal Kock, DO  [facility] Pager [phone]  If 7PM-7AM, please contact night-coverage [email or web address] Password [email or web address] 08/22/2013, 6:20 PM   LOS: 11 days

## 2013-08-22 NOTE — Progress Notes (Signed)
Pt ambulated about 150 feet half way up and down hallway. Tolerated ambulation with no problems. Sat up in recliner afterwards. Vwilliams, rn.

## 2013-08-22 NOTE — Progress Notes (Signed)
General Surgery Note  LOS: 11 days  POD -  8 Days Post-Op  Assessment/Plan: 1.    Infected prosthetic mesh of abdominal wall with GIANT abscess s/p removal - 08/14/2013 - S. Gross  EC fistula  WBC - 12,100 - 08/22/2013  On Zosyn/Vanc  Suction pouch over fistula  Needs to ambulate more   2.  DVT prophylaxis - Heparin infusion 3.  Atrial fib/flutter 4.  Diabetes mellitus, insulin dependent (IDDM)  Glucose - 179 - 08/22/2013 5.  Severe malnutrtion  On TPN  6.  Morbid Obesity, Class III, BMI 40-49.9 (morbid obesity) 7.    OBSTRUCTIVE SLEEP APNEA  Does not like to wear his CPAP - uses nasal O2 prongs 8.  Hypertension 9.  Chronic combined systolic and diastolic CHF (congestive heart failure) 10.   AAA (abdominal aortic aneurysm)/ 4.5 cm ascending per CT angio 08/01/13  Subjective:  Doing okay.  Discussed ambulation with the patient.  He has not questions. Objective:   Filed Vitals:   08/22/13 0505  BP: 134/70  Pulse: 65  Temp: 98.5 F (36.9 C)  Resp: 18     Intake/Output from previous day:  11/15 0701 - 11/16 0700 In: 6202.3 [P.O.:180; I.V.:1224.4; IV Piggyback:50; TPN:4747.9] Out: 2982 [Urine:2400; Drains:582]  Intake/Output this shift:      Physical Exam:   General: Obese AA M who is alert and oriented.    HEENT: Normal. Pupils equal. .   Lungs: Clear   Abdomen: Large pouch over midline to capture EC contents.  Morbidly obese.   Lab Results:     Recent Labs  08/21/13 0400 08/22/13 0500  WBC 13.4* 12.1*  HGB 12.3* 12.0*  HCT 36.4* 36.2*  PLT 380 365    BMET    Recent Labs  08/21/13 0400 08/22/13 0500  NA 137 137  K 3.5 3.5  CL 98 97  CO2 33* 34*  GLUCOSE 107* 179*  BUN 12 15  CREATININE 1.09 1.19  CALCIUM 9.0 9.3    PT/INR  No results found for this basename: LABPROT, INR,  in the last 72 hours  ABG  No results found for this basename: PHART, PCO2, PO2, HCO3,  in the last 72 hours   Studies/Results:  No results  found.   Anti-infectives:   Anti-infectives   Start     Dose/Rate Route Frequency Ordered Stop   08/16/13 0800  vancomycin (VANCOCIN) 2,000 mg in sodium chloride 0.9 % 500 mL IVPB     2,000 mg 250 mL/hr over 120 Minutes Intravenous Every 24 hours 08/15/13 1411     08/15/13 1500  vancomycin (VANCOCIN) 2,000 mg in sodium chloride 0.9 % 500 mL IVPB     2,000 mg 250 mL/hr over 120 Minutes Intravenous  Once 08/15/13 1411 08/15/13 1713   08/14/13 1600  piperacillin-tazobactam (ZOSYN) IVPB 3.375 g     3.375 g 12.5 mL/hr over 240 Minutes Intravenous Every 8 hours 08/14/13 1543        Alphonsa Overall, MD, FACS Pager: [facility] Office: [phone] 08/22/2013

## 2013-08-22 NOTE — Progress Notes (Signed)
PARENTERAL NUTRITION CONSULT NOTE - FOLLOW UP  Pharmacy Consult for TNA Indication: Enterocutaneous fistula  No Known Allergies  Patient Measurements: Height: 6' (182.9 cm) Weight: 332 lb 14.3 oz (151 kg) IBW/kg (Calculated) : 77.6 Adjusted Body Weight: 102.6kg  Vital Signs: Temp: 98.5 F (36.9 C) (11/16 0505) Temp src: Oral (11/16 0505) BP: 134/70 mmHg (11/16 0505) Pulse Rate: 65 (11/16 0505) Intake/Output from previous day: 11/15 0701 - 11/16 0700 In: 6202.3 [P.O.:180; I.V.:1224.4; IV Piggyback:50; TPN:4747.9] Out: 2982 [Urine:2400; Drains:582] Intake/Output from this shift: Total I/O In: 60 [P.O.:60] Out: -   Labs:  Recent Labs  08/20/13 0528 08/21/13 0400 08/22/13 0500  WBC 10.3 13.4* 12.1*  HGB 11.9* 12.3* 12.0*  HCT 36.0* 36.4* 36.2*  PLT 374 380 365     Recent Labs  08/20/13 0528 08/21/13 0400 08/22/13 0500  NA 140 137 137  K 3.5 3.5 3.5  CL 99 98 97  CO2 34* 33* 34*  GLUCOSE 107* 107* 179*  BUN 12 12 15   CREATININE 1.06 1.09 1.19  CALCIUM 8.9 9.0 9.3  MG 1.4* 1.7 1.7  PHOS 3.7  --  4.2  PROT  --   --  6.8  ALBUMIN  --   --  2.1*  AST  --   --  19  ALT  --   --  12  ALKPHOS  --   --  28*  BILITOT  --   --  0.4   Estimated Creatinine Clearance: 99.9 ml/min (by C-G formula based on Cr of 1.19).    Recent Labs  08/22/13 0007 08/22/13 0359 08/22/13 0715  GLUCAP 206* 179* 179*    Medications:  Scheduled:  . acetaminophen  1,000 mg Oral TID  . atorvastatin  40 mg Oral QODAY  . carvedilol  6.25 mg Oral BID WC  . enoxaparin (LOVENOX) injection  150 mg Subcutaneous Q12H  . furosemide  80 mg Oral BID  . insulin aspart  0-20 Units Subcutaneous Q4H  . insulin glargine  65 Units Subcutaneous QHS  . lip balm  1 application Topical BID  . OxyCODONE  10 mg Oral Q12H  . piperacillin-tazobactam (ZOSYN)  IV  3.375 g Intravenous Q8H  . psyllium  1 packet Oral Daily  . saccharomyces boulardii  250 mg Oral BID  . sodium chloride  3 mL  Intravenous Q12H  . vancomycin  2,000 mg Intravenous Q24H   Infusions:  . sodium chloride 10 mL/hr (08/19/13 0010)  . [name] KitchenTPN (CLINIMIX-E) Adult 100 mL/hr at 08/21/13 1800   And  . fat emulsion 250 mL (08/21/13 1759)    Insulin Requirements in the past 24 hours:  Lantus 65units sq qHS (increasing doses since admit) 31 units SSI required  - CBG q4h with resistant scale coverage  Nutritional Goals: Per RD 11/13 Kcal: 2400-2700 Protein: 160-180 grams Fluid: 3.3-3.5 L/day  Clinimix E5/15 at goal rate 125 ml/hr (3L/day) to provide 150g protein and 2675 kcal daily - Due to how much fluid can be placed in TNA bag must max out amount at 3L  Current Nutrition:  NPO Clinimix E5/15 at 100 ml/hr and 20% lipids at 60ml/hr   IVF: NS at 54ml/hr  Assessment: 62 yo male with large abdominal abscess with infected old Gore Tex mesh s/p I&D abscess, removal of mesh, debridement of abdominal wall and peritoneum. 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 TNA started 11/12 at 10pm.   Glucose: Hx DM on insulin PTA, CBG creeping upward with increase in TNA rate -  136, 159, 167, 181, 206, 179 and 179  Electrolytes: K on low end of normal and stable - monitor fistula output closely (526ml last 24hr) as this will deplete electrolytes. Na, Mag and Phos WNL  LFTs: wnl, albumin low (2.0)  TGs: wnl, 124 (11/13)  Prealbumin: low, 10.3 (11/13)  Monitor fluid status very closely if TNA gets to high goal rate - fluids not currently documented correctly last 24hr - TNA was not 4747 mL - should have been more like 215mL based on rates of 2 TNA bags making correct I/O last 24hr more like +597 mL   TPN Access: Triple lumen PICC placed 11/12 TPN day#: 5  Plan: At 1800 today:  Increase Clinimix E5/15 to goal rate of 136ml/hr.  20% fat emulsion at 86ml/hr.  Add 20 units/L to TNA bag  Plan to advance as tolerated to the goal rate.  TNA to contain standard multivitamins and trace elements.  IVF  per MD  TNA lab panels on Mondays & Thursdays.   Adrian Saran, PharmD, BCPS Pager [phone] 08/22/2013 10:38 AM

## 2013-08-23 DIAGNOSIS — K5900 Constipation, unspecified: Secondary | ICD-10-CM

## 2013-08-23 DIAGNOSIS — K632 Fistula of intestine: Secondary | ICD-10-CM

## 2013-08-23 DIAGNOSIS — K651 Peritoneal abscess: Secondary | ICD-10-CM

## 2013-08-23 HISTORY — DX: Fistula of intestine: K63.2

## 2013-08-23 LAB — DIFFERENTIAL
Basophils Absolute: 0 10*3/uL (ref 0.0–0.1)
Eosinophils Absolute: 0.6 10*3/uL (ref 0.0–0.7)
Eosinophils Relative: 5 % (ref 0–5)
Lymphocytes Relative: 15 % (ref 12–46)
Lymphs Abs: 1.9 10*3/uL (ref 0.7–4.0)
Monocytes Absolute: 0.9 10*3/uL (ref 0.1–1.0)
Monocytes Relative: 7 % (ref 3–12)

## 2013-08-23 LAB — CBC
HCT: 36.2 % — ABNORMAL LOW (ref 39.0–52.0)
MCH: 32.1 pg (ref 26.0–34.0)
MCV: 96 fL (ref 78.0–100.0)
RBC: 3.77 MIL/uL — ABNORMAL LOW (ref 4.22–5.81)
RDW: 14.3 % (ref 11.5–15.5)
WBC: 12 10*3/uL — ABNORMAL HIGH (ref 4.0–10.5)

## 2013-08-23 LAB — COMPREHENSIVE METABOLIC PANEL
Alkaline Phosphatase: 29 U/L — ABNORMAL LOW (ref 39–117)
BUN: 20 mg/dL (ref 6–23)
Calcium: 9.4 mg/dL (ref 8.4–10.5)
Chloride: 97 mEq/L (ref 96–112)
GFR calc Af Amer: 80 mL/min — ABNORMAL LOW (ref >90)
Glucose, Bld: 138 mg/dL — ABNORMAL HIGH (ref 70–99)
Potassium: 3.5 mEq/L (ref 3.5–5.1)
Total Bilirubin: 0.4 mg/dL (ref 0.3–1.2)

## 2013-08-23 LAB — MAGNESIUM: Magnesium: 1.7 mg/dL (ref 1.5–2.5)

## 2013-08-23 LAB — GLUCOSE, CAPILLARY: Glucose-Capillary: 155 mg/dL — ABNORMAL HIGH (ref 70–99)

## 2013-08-23 MED ORDER — CHLORHEXIDINE GLUCONATE 0.12 % MT SOLN
15.0000 mL | Freq: Two times a day (BID) | OROMUCOSAL | Status: DC
  Administered 2013-08-23 – 2013-08-31 (×16): 15 mL via OROMUCOSAL
  Filled 2013-08-23 (×21): qty 15

## 2013-08-23 MED ORDER — OXYCODONE HCL ER 20 MG PO T12A
20.0000 mg | EXTENDED_RELEASE_TABLET | Freq: Two times a day (BID) | ORAL | Status: DC
  Administered 2013-08-24 (×2): 20 mg via ORAL
  Filled 2013-08-23 (×2): qty 1

## 2013-08-23 MED ORDER — OXYCODONE HCL 5 MG PO TABS
5.0000 mg | ORAL_TABLET | Freq: Once | ORAL | Status: AC
  Administered 2013-08-23: 10 mg via ORAL

## 2013-08-23 MED ORDER — BIOTENE DRY MOUTH MT LIQD
15.0000 mL | Freq: Two times a day (BID) | OROMUCOSAL | Status: DC
  Administered 2013-08-23 – 2013-10-15 (×98): 15 mL via OROMUCOSAL

## 2013-08-23 MED ORDER — OCTREOTIDE ACETATE 100 MCG/ML IJ SOLN
100.0000 ug | Freq: Three times a day (TID) | INTRAMUSCULAR | Status: DC
  Administered 2013-08-23 – 2013-08-25 (×9): 100 ug via SUBCUTANEOUS
  Filled 2013-08-23 (×14): qty 1

## 2013-08-23 MED ORDER — TRACE MINERALS CR-CU-F-FE-I-MN-MO-SE-ZN IV SOLN
INTRAVENOUS | Status: AC
  Administered 2013-08-23: 17:00:00 via INTRAVENOUS
  Filled 2013-08-23: qty 3000

## 2013-08-23 MED ORDER — FAT EMULSION 20 % IV EMUL
250.0000 mL | INTRAVENOUS | Status: AC
  Administered 2013-08-23: 250 mL via INTRAVENOUS
  Filled 2013-08-23: qty 250

## 2013-08-23 NOTE — Progress Notes (Signed)
Physical Therapy Treatment Patient Details Name: Kurt Cross MRN: [id number] DOB: 16-Mar-1951 Today's Date: 08/23/2013 Time: [id number] PT Time Calculation (min): 33 min  PT Assessment / Plan / Recommendation  History of Present Illness Kurt Cross is a 62 y.o. male  with known history of chronic systolic heart failure status post defibrillator placement last EF measured was in 2013 was 35-40%, on home oxygen, OSA noncompliant with CPAP, diabetes mellitus, hypertension and hyperlipidemia presented to the ER because right leg pain after sustaining a fall the night prior to admission.  08/14/13: s/p abdominal abscess removal   PT Comments   Pt demonstrating progress as he was able to ambulate 140' with RW and less assist today. Pt continues to be limited by abdominal pain. Pt would continue to benefit from skilled PT in order to improve functional mobility and safety.  Follow Up Recommendations  SNF     Does the patient have the potential to tolerate intense rehabilitation     Barriers to Discharge        Equipment Recommendations  None recommended by PT    Recommendations for Other Services    Frequency Min 3X/week   Progress towards PT Goals Progress towards PT goals: Progressing toward goals  Plan Current plan remains appropriate    Precautions / Restrictions Precautions Precautions: Fall Precaution Comments: ostomy bag on R side Restrictions Weight Bearing Restrictions: No   Pertinent Vitals/Pain Pt reports 10/10 abdominal pain at rest, RN informed. Pt and RN report pt received pain meds prior to PT and pt willing to ambulate. Pt positioned to comfort at end of session. Pt reported he does not use O2 during the day and requested 1L O2 [place] to be removed. SaO2 at rest on room air: 96%, SaO2 after ambulation on room air: 90-92% and no c/o SOB. However, pt did request 1L O2  be reapplied at end of session, RN informed.    Mobility  Bed Mobility Bed Mobility: Supine to  Sit;Sitting - Scoot to Edge of Bed Supine to Sit: 5: Supervision;HOB elevated Sitting - Scoot to Edge of Bed: 5: Supervision Details for Bed Mobility Assistance: supervision to ensure safety. pt SaO2 on room air at rest: 96%, see vitals section for details. Transfers Transfers: Sit to Stand;Stand to Sit Sit to Stand: 4: Min guard;From bed;From elevated surface;With upper extremity assist Stand to Sit: 4: Min guard;To chair/3-in-1;With upper extremity assist;With armrests Details for Transfer Assistance: min guard to ensure safety. Ambulation/Gait Ambulation/Gait Assistance: 4: Min guard Assistive device: Rolling walker Ambulation/Gait Assistance Details: +2 assist to ensure safety and to manage lines during ambulation, pt demonstrated good balance during ambulation with RW. Gait Pattern: Step-through pattern;Wide base of support;Decreased stride length Gait velocity: decreased General Gait Details: pt's SaO2 after ambulation on room air: 90-92% with no c/o SOB/dizziness.    Exercises General Exercises - Lower Extremity Ankle Circles/Pumps: 20 reps;Supine;Both;AROM (pt required rest breaks during exercises due to fatigue) Quad Sets: AROM;Supine;Both;20 reps   PT Diagnosis:    PT Problem List:   PT Treatment Interventions:     PT Goals (current goals can now be found in the care plan section) Acute Rehab PT Goals Patient Stated Goal: none specified PT Goal Formulation: With patient Time For Goal Achievement: 09/06/13 Potential to Achieve Goals: Good  Visit Information  Last PT Received On: 08/23/13 Assistance Needed: +2 (for safety during ambulation) History of Present Illness: Kurt Cross is a 62 y.o. male  with known history of chronic systolic heart  failure status post defibrillator placement last EF measured was in 2013 was 35-40%, on home oxygen, OSA noncompliant with CPAP, diabetes mellitus, hypertension and hyperlipidemia presented to the ER because right leg pain after  sustaining a fall the night prior to admission.  08/14/13: s/p abdominal abscess removal    Subjective Data  Patient Stated Goal: none specified   Cognition  Cognition Arousal/Alertness: Awake/alert Behavior During Therapy: WFL for tasks assessed/performed Overall Cognitive Status: Within Functional Limits for tasks assessed    Balance     End of Session PT - End of Session Equipment Utilized During Treatment: Oxygen Activity Tolerance: Patient tolerated treatment well Patient left: in chair;with call bell/phone within reach Nurse Communication: Mobility status;Other (comment) (pt reports pain 10/10)   GP     Audie Clear 08/23/2013, 12:58 PM

## 2013-08-23 NOTE — Progress Notes (Signed)
[facility]    Date of Admission:  08/11/2013   Total days of antibiotics 10        Day 10 piptazo        Day 9 vanco           ID: Kurt Cross is a 62 y.o. male  with a remote history of bowel perforation requiring exploratory laparotomy and temporary colostomy. He then developed a ventral hernia and underwent mesh repair decades ago. He was recently hospitalized and treated for possible pneumonia. He had a fall at home recently and injured his right lower leg leading to readmission. He was noted to be febrile with some abdominal wall swelling. CT scan revealed a large abdominal wall abscess. POD#3 from drainage of 3 L of pus and removal of his old Gore-Tex mesh; abdominal wound now has ECF with moderate output  Principal Problem:   Entero-atmospheric fistula Active Problems:   Diabetes mellitus, insulin dependent (IDDM), uncontrolled   HYPERLIPIDEMIA   Obesity, Class III, BMI 40-49.9 (morbid obesity)   OBSTRUCTIVE SLEEP APNEA   Hypertension   Acute-on-chronic respiratory failure secondary to probable community-acquired pneumonia   Fever   Sepsis   Constipation, chronic   Chronic combined systolic and diastolic CHF (congestive heart failure)   Right leg pain   Leukocytosis   AAA (abdominal aortic aneurysm)/ 4.5 cm ascending per CT angio 08/01/13   Infected prosthetic mesh of abdominal wall with GIANT abscess s/p removal 08/14/2013   Recurrent ventral incisional hernia   Acute renal failure (ARF)    Subjective: Afebrile,  24hr events: surgery to start sandosatin to decrease ECF outputm, continues on TPN  Medications:  . acetaminophen  1,000 mg Oral TID  . antiseptic oral rinse  15 mL Mouth Rinse q12n4p  . atorvastatin  40 mg Oral QODAY  . carvedilol  6.25 mg Oral BID WC  . chlorhexidine  15 mL Mouth Rinse BID  . enoxaparin (LOVENOX) injection  150 mg Subcutaneous Q12H  . furosemide  80 mg Oral BID  . insulin aspart  0-20 Units Subcutaneous  Q4H  . insulin glargine  65 Units Subcutaneous QHS  . lip balm  1 application Topical BID  . octreotide  100 mcg Subcutaneous TID  . OxyCODONE  10 mg Oral Q12H  . piperacillin-tazobactam (ZOSYN)  IV  3.375 g Intravenous Q8H  . psyllium  1 packet Oral Daily  . saccharomyces boulardii  250 mg Oral BID  . sodium chloride  3 mL Intravenous Q12H  . vancomycin  2,000 mg Intravenous Q24H    Objective: Vital signs in last 24 hours: Temp:  [98.7 F (37.1 C)-99 F (37.2 C)] 99 F (37.2 C) (11/17 0501) Pulse Rate:  [59-62] 62 (11/17 0501) Resp:  [18-20] 20 (11/17 0501) BP: (137-139)/(55-66) 139/66 mmHg (11/17 0501) SpO2:  [97 %-99 %] 97 % (11/17 0501) Weight:  [332 lb 14.3 oz (151 kg)] 332 lb 14.3 oz (151 kg) (11/17 0501)  General: Alert and comfortable talking on the telephone  Skin: No rash, splitter or conjunctival hemorrhages  Lungs: Clear  Cor: Regular S1 and S2 with no murmurs  Abdomen: Obese and distended.  dressing in place   Lab Results  Recent Labs  08/22/13 0500 08/23/13 0548  WBC 12.1* 12.0*  HGB 12.0* 12.1*  HCT 36.2* 36.2*  NA 137 139  K 3.5 3.5  CL 97 97  CO2 34* 34*  BUN 15 20  CREATININE 1.19 1.13   Liver  Panel  Recent Labs  08/22/13 0500 08/23/13 0548  PROT 6.8 7.0  ALBUMIN 2.1* 2.1*  AST 19 17  ALT 12 12  ALKPHOS 28* 29*  BILITOT 0.4 0.4    Microbiology: 11/9 wound cx: NGTD, gram stain GPC in prs and clusters, few CoNS 11/9 blood cx: NGTD  Studies/Results: No results found.   Assessment/Plan: Abdominal abscess involving abdominal mesh since removed, now complicated by possible EC fistula - continue with broad spectrum antibiotics. Cultures shows few CoNS. Plan is unchanged. He is currently on day 10.  -  Will  continue with vanco and pip/tazo for at least 2 -3 wks. Recommend to repeat imaging prior to discharge to assess when can estimate discontinuation of antibiotics - defer to surgery to continue parental nutrition to contain/heal EC  fistula  Aleister Lady, [facility] Cell: [phone] Pager: [phone]  08/23/2013, 2:35 PM

## 2013-08-23 NOTE — Progress Notes (Signed)
Spoke with patient regarding cpap.  Pt stated he didn't wear it last night and does not want to wear it tonight.  Pt was advised that RT is available all night should he change his mind to let his nurse know.

## 2013-08-23 NOTE — Progress Notes (Signed)
[facility] PROGRESS NOTE  Kurt Cross [id number] DOB: [birth year] DOA: 08/11/2013 PCP: [name], MD  Assessment/Plan: #1 infected prosthetic Gore-Tex mesh with a giant abdominal abscess status post IandD and Gore-Tex mesh removal 08/14/2013  Patient is status post incision and drainage of abscess, Gore-Tex mesh removal, debridement of abdominal wall and peritoneum 08/14/2013.  -empiric IV Vancomycin and Zosyn.  -Blood cultures--neg  -Continue wound care as directed by general surgery.  -now with EC fistula-->started on sandostatin by surgery 08/23/13 -PICC and TNA per surgery  -abx per ID  -increase long acting oxycontin 20mg  q 12hrs for pain control  -Patient was asking for OxyIR only 2-3 times per day--I have educated the patient that he may ask for up to 6 times per day if pain remains uncontrolled  -CT abdomen if WBC continues to rise  #2 sepsis  -secondary to problem #1.  -Wound cultures prelim with GPC in pairs and clusters-->Coag Neg Staph  -Remains hemodynamically stable  #3 hypotension  -resolved  -secondary to problem #2.  -Blood pressure responded to IV fluids.  -Increase carvedilol to home dose  #4 atrial fibrillation/atrial flutter  Likely secondary to problem #1.  -currently rate controlled. EKG showed flutter waves.  -Cardiac enzymes have been negative x3. 2-D echo with EF of 50-55%. Wall motion abnormalities cannot be excluded.  -CHADS 2 score is about 3.  -Continue Coreg for rate control.  -Eliquis was discontinued as patient went for emergent surgery secondary to problem #1. General surgery to advise on anticoagulation, eliquis, may be resumed.  -switch to [place] Lovenox until surgery clears pt for Eliquis (pt did not want the frequent blood draws from the heparin gtt)  -scheduled for TEE cardioversion however in light of problem #1 this may be postponed. Will defer to cardiology.  - F/u with cardiology 2-3 weeks post discharge.  #5 right lower extremity  pain  Secondary to fall. Pain is more in the posterior thigh/hamstrings and posterior Likely musculoskeletal in nature. Patient with clinical improvement. Continue warm compresses. PT/OT. Follow  #6 leukocytosis/fever  -secondary to problem #1.  -resolvedCurrently afebrile.  -WBC up a little but remains stable  -Chest x-Uno is negative for infiltrate. Patient with no respiratory symptoms.  #7 chronic combined systolic and diastolic CHF  -Initial CXR with volume overload. given 4 doses of lasix 40mg  IV, then back to home dose. Stable.  -Restart low-dose carvedilol  -restarted home po lasix am 11/13  -08/12/2013 echocardiogram shows EF 50-55%--EF improved compared to one year ago (35-40%)  #8 chronic kidney disease stage III  -Creatinine is trending back down. ACE inhibitor had been discontinued.  -Plan to restart ACE inhibitor if renal function remains stable  #9 hyperlipidemia  Continue statin.  #10 4.5 cm ascending aortic aneurysm per CT scan of 08/01/2013  Stable. Asymptomatic. Continue risk factor modification with good blood pressure control. Will need outpatient followup with vascular surgery as outpatient.  #11 type 2 diabetes  Hemoglobin A1c was 9.5 on 08/02/2013.Continue Lantus 65 units daily. -Sliding scale insulin.  -CBGs slowly creeping up with TNA  Hypomagnesemia  -Repleted Family Communication: wife at beside  Disposition Plan: Home when cleared by surgery   Vancomycin 08/15/13>>> Zosyn 08/25/13>>>        Procedures/Studies: Ct Abdomen Pelvis Wo Contrast  08/14/2013   CLINICAL DATA:  Fever, leukocytosis, prior hernia repairs and appendectomy  EXAM: CT ABDOMEN AND PELVIS WITHOUT CONTRAST  TECHNIQUE: Multidetector CT imaging of the abdomen and pelvis was performed following the standard protocol without  intravenous contrast.  COMPARISON:  None.  FINDINGS: Note: Given body habitus, the entire anterior abdominal wall (including a portion of the abnormality) could not be  included in the field of view despite two separate attempts.  Prior ventral hernia mesh repair. Just anterior to the mesh, within the anterior abdominal wall, is an abdominal wall fluid collection/abscess measuring approximately 14.5 x 18.1 x 26.1 cm (incompletely visualized). Along the superior aspect of the fluid collection, gas extends to immediately beneath the skin surface (series 10/image 27). Bowel loops are displaced posteriorly by the large collection fluid without definite communication.  A small hernia containing fat and a loop of nondilated colon is present inferiorly (sagittal image 88), to the left of midline (series 10/image 81 and 86).  Minimal left basilar atelectasis.  Cardiomegaly. Coronary atherosclerosis. ICD leads, incompletely visualized.  Moderate hepatic steatosis.  Unenhanced spleen and pancreas are unremarkable.  Low-density nodular thickening of the bilateral adrenal glands, likely reflecting adrenal adenomas.  Gallbladder is notable for layering gallstone. No intrahepatic or extrahepatic ductal dilatation.  Kidneys are unremarkable. No renal calculi or hydronephrosis.  No evidence of bowel obstruction. Prior appendectomy.  Atherosclerotic calcifications of the abdominal aorta and branch vessels.  No abdominopelvic ascites.  No suspicious abdominopelvic lymphadenopathy.  Prostate is unremarkable.  Bladder is within normal limits.  Degenerative changes of the visualized thoracolumbar spine.  IMPRESSION: 14.5 x 18.1 x 26.1 cm fluid collection/abscess within the anterior abdominal wall, as described above, incompletely visualized. The collection appears just anterior to prior ventral hernia mesh.  Given the size and proximity to the skin surface, incision and drainage is suggested.  These results were called by telephone at the time of interpretation on 08/14/2013 at 3:22 PM to Dr. Irine Seal , who verbally acknowledged these results.   Electronically Signed   By: Julian Hy M.D.    On: 08/14/2013 15:28   Dg Chest 2 View  08/01/2013   CLINICAL DATA:  Chest pain, shortness of breath.  EXAM: CHEST  2 VIEW  COMPARISON:  August 24, 2012.  FINDINGS: Stable cardiomegaly. Left-sided pacemaker is noted. No pleural effusion or pneumothorax is noted. Mild central pulmonary vascular congestion is noted which appears to be chronic. No acute pulmonary disease is noted.  IMPRESSION: No active cardiopulmonary disease.   Electronically Signed   By: Sabino Dick M.D.   On: 08/01/2013 17:06   Dg Hip Complete Right  08/11/2013   CLINICAL DATA:  Right posterior hip pain.  EXAM: RIGHT HIP - COMPLETE 2+ VIEW  COMPARISON:  None.  FINDINGS: There is no evidence of hip fracture or dislocation. There is no evidence of arthropathy or other focal bone abnormality.  IMPRESSION: No acute osseous injury of the right hip.   Electronically Signed   By: Kathreen Devoid   On: 08/11/2013 15:16   Ct Angio Chest Pe W/cm &/or Wo Cm  08/01/2013   CLINICAL DATA:  Shortness of Breath,  EXAM: CT ANGIOGRAPHY CHEST WITH CONTRAST  TECHNIQUE: Multidetector CT imaging of the chest was performed using the standard protocol during bolus administration of intravenous contrast. Multiplanar CT image reconstructions including MIPs were obtained to evaluate the vascular anatomy.  CONTRAST:  161mL OMNIPAQUE IOHEXOL 350 MG/ML SOLN  COMPARISON:  06/28/2012  FINDINGS: There is fairly good contrast opacification of the pulmonary artery branches with no convincing filling defects to suggest acute PE. There is good contrast opacification of the thoracic aorta with some scattered plaque, no evidence of dissection or stenosis. There is dilatation  of the ascending aorta measuring up to 4.5 cm transverse diameter, stable by my measurement since prior study. Bovine brachiocephalic arterial origin anatomy without proximal stenosis. No pleural or pericardial effusion. No hilar or mediastinal adenopathy. Transvenous pacing leads are noted. Minimal  linear scarring or subsegmental atelectasis in both lower lobes. Lungs otherwise clear. Visualized portions of upper abdomen unremarkable. Multiple bridging osteophytes throughout the mid and lower thoracic spine. Sternum intact.  Review of the MIP images confirms the above findings.  IMPRESSION: 1. 4.5 cm ascending aortic aneurysm without complicating features. 2. No evidence of acute pulmonary embolism or aortic dissection.   Electronically Signed   By: Arne Cleveland M.D.   On: 08/01/2013 19:55   Dg Chest Port 1 View  08/18/2013   CLINICAL DATA:  Confirm line placement  EXAM: PORTABLE CHEST - 1 VIEW  COMPARISON:  08/17/2013  FINDINGS: Two images were taken. The patient is in semi-upright position and is slightly rotated to the right. A right upper extremity PICC has been placed, the distal tip of which is in the mid superior vena cava. Left chest wall triple lead pacemaker appears stable. Cardiomegaly. There is diffuse pulmonary vascular congestion and similar appearance of pulmonary edema. No pneumothorax or pleural effusion is appreciated.  IMPRESSION: 1. Right upper extremity PICC terminates in the mid superior vena cava. 2. Stable congestive heart failure pattern.   Electronically Signed   By: Curlene Dolphin M.D.   On: 08/18/2013 21:39   Dg Chest Port 1 View  08/17/2013   CLINICAL DATA:  Respiratory failure  EXAM: PORTABLE CHEST - 1 VIEW  COMPARISON:  08/15/2013.  FINDINGS: Persistent cardiomegaly with pulmonary vascular prominence and interstitial prominence suggesting congestive heart failure with interstitial edema noted. Interstitial pneumonitis cannot be excluded. Cardiac pacer. Lead tips in stable position. No pneumothorax. No acute osseous abnormality.  IMPRESSION: Stable chest with changes of congestive heart failure with pulmonary interstitial edema. Cardiac pacer in stable position.   Electronically Signed   By: Marcello Moores  Register   On: 08/17/2013 07:41   Dg Chest Portable 1  View  08/15/2013   CLINICAL DATA:  Sepsis, respiratory distress  EXAM: PORTABLE CHEST - 1 VIEW  COMPARISON:  Prior radiograph from 08/13/2013  FINDINGS: Left-sided pacemaker is unchanged. Cardiomegaly is stable as compared to the prior exam.  Lungs are normally inflated. There has been worsening of pulmonary vascular congestion and edema as compared to the prior examination. No definite pleural effusion. No focal infiltrates appreciated. No pneumothorax.  Osseous structures are unchanged.  IMPRESSION: Stable cardiomegaly with interval worsening of pulmonary vascular congestion/ edema relative to 08/13/2013.   Electronically Signed   By: Jeannine Boga M.D.   On: 08/15/2013 01:16   Dg Chest Port 1 View  08/13/2013   CLINICAL DATA:  Leukocytosis.  EXAM: PORTABLE CHEST - 1 VIEW  COMPARISON:  08/01/2013  FINDINGS: No focal pulmonary consolidation is identified. There is stable mild pulmonary venous prominence without overt edema or visualized pleural fluid. The heart remains mildly enlarged. There is stable appearance of a biventricular pacemaker.  IMPRESSION: No acute findings. Stable cardiomegaly and pulmonary venous prominence without overt edema.   Electronically Signed   By: Aletta Edouard M.D.   On: 08/13/2013 15:44         Subjective: Patient complains of some abdominal pain. He denies any vomiting, diarrhea, chest pain, shortness breath, cough, hemoptysis, fevers, chills.  Objective: Filed Vitals:   08/22/13 0505 08/22/13 1312 08/22/13 2001 08/23/13 0501  BP: 134/70 140/63  137/55 139/66  Pulse: 65 58 59 62  Temp: 98.5 F (36.9 C) 98.4 F (36.9 C) 98.7 F (37.1 C) 99 F (37.2 C)  TempSrc: Oral Oral Oral Oral  Resp: 18 18 18 20   Height:      Weight: 151 kg (332 lb 14.3 oz)   151 kg (332 lb 14.3 oz)  SpO2: 99% 95% 99% 97%    Intake/Output Summary (Last 24 hours) at 08/23/13 1605 Last data filed at 08/23/13 1434  Gross per 24 hour  Intake 5695.84 ml  Output   1842 ml  Net  3853.84 ml   Weight change: 0 kg (0 lb) Exam:   General:  Pt is alert, follows commands appropriately, not in acute distress  HEENT: No icterus, No thrush, No neck mass, [place]/AT  Cardiovascular: RRR, S1/S2, no rubs, no gallops  Respiratory: CTA bilaterally, no wheezing, no crackles, no rhonchi Abdomen: Soft/+BS, non tender, non distended, no guarding;;large ventral wound with ostomy bag; no erythema--no necrosis, no crepitance    Extremities: 1+ edema, No lymphangitis, No petechiae, No rashes, no synovitis  Data Reviewed: Basic Metabolic Panel:  Recent Labs Lab 08/19/13 0435 08/20/13 0528 08/21/13 0400 08/22/13 0500 08/23/13 0548  NA 138 140 137 137 139  K 3.8 3.5 3.5 3.5 3.5  CL 101 99 98 97 97  CO2 30 34* 33* 34* 34*  GLUCOSE 103* 107* 107* 179* 138*  BUN 14 12 12 15 20   CREATININE 1.08 1.06 1.09 1.19 1.13  CALCIUM 9.2 8.9 9.0 9.3 9.4  MG 1.5 1.4* 1.7 1.7 1.7  PHOS 3.9 3.7  --  4.2 4.5   Liver Function Tests:  Recent Labs Lab 08/19/13 0435 08/22/13 0500 08/23/13 0548  AST 36 19 17  ALT 21 12 12   ALKPHOS 37* 28* 29*  BILITOT 0.5 0.4 0.4  PROT 6.8 6.8 7.0  ALBUMIN 2.0* 2.1* 2.1*   No results found for this basename: LIPASE, AMYLASE,  in the last 168 hours No results found for this basename: AMMONIA,  in the last 168 hours CBC:  Recent Labs Lab 08/17/13 0325 08/18/13 0645 08/19/13 0435 08/20/13 0528 08/21/13 0400 08/22/13 0500 08/23/13 0548  WBC 12.3* 11.4* 11.2* 10.3 13.4* 12.1* 12.0*  NEUTROABS 9.4* 8.5* 7.9*  --   --   --  8.7*  HGB 12.1* 12.4* 12.1* 11.9* 12.3* 12.0* 12.1*  HCT 36.7* 37.3* 36.7* 36.0* 36.4* 36.2* 36.2*  MCV 97.1 96.6 97.3 97.8 96.6 96.5 96.0  PLT 366 402* 392 374 380 365 340   Cardiac Enzymes: No results found for this basename: CKTOTAL, CKMB, CKMBINDEX, TROPONINI,  in the last 168 hours BNP: No components found with this basename: POCBNP,  CBG:  Recent Labs Lab 08/22/13 2003 08/22/13 2353 08/23/13 0332  08/23/13 0744 08/23/13 1209  GLUCAP 211* 158* 135* 153* 156*    Recent Results (from the past 240 hour(s))  SURGICAL PCR SCREEN     Status: None   Collection Time    08/14/13  4:35 PM      Result Value Range Status   MRSA, PCR NEGATIVE  NEGATIVE Final   Staphylococcus aureus NEGATIVE  NEGATIVE Final   Comment:            The Xpert SA Assay (FDA     approved for NASAL specimens     in patients over 102 years of age),     is one component of     a comprehensive surveillance     program.  Test performance  has     been validated by [employer] for patients greater     than or equal to 53 year old.     It is not intended     to diagnose infection nor to     guide or monitor treatment.  CULTURE, ROUTINE-ABSCESS     Status: None   Collection Time    08/14/13  6:28 PM      Result Value Range Status   Specimen Description ABDOMEN INCISION DRAINAGE   Final   Special Requests NONE   Final   Gram Stain     Final   Value: ABUNDANT WBC PRESENT, PREDOMINANTLY PMN     NO SQUAMOUS EPITHELIAL CELLS SEEN     MODERATE GRAM POSITIVE COCCI IN PAIRS     IN CLUSTERS     Performed at [facility]   Culture     Final   Value: RARE STAPHYLOCOCCUS SPECIES (COAGULASE NEGATIVE)     Performed at [facility]   Report Status 08/19/2013 FINAL   Final  ANAEROBIC CULTURE     Status: None   Collection Time    08/14/13  6:28 PM      Result Value Range Status   Specimen Description ABDOMEN INCISION DRAINAGE   Final   Special Requests NONE   Final   Gram Stain     Final   Value: ABUNDANT WBC PRESENT, PREDOMINANTLY PMN     NO SQUAMOUS EPITHELIAL CELLS SEEN     FEW GRAM POSITIVE COCCI     IN PAIRS IN CLUSTERS     Performed at [facility]   Culture     Final   Value: NO ANAEROBES ISOLATED     Performed at [facility]   Report Status 08/20/2013 FINAL   Final  CULTURE, BLOOD (ROUTINE X 2)     Status: None   Collection Time    08/15/13 12:40 AM      Result Value  Range Status   Specimen Description BLOOD LEFT ARM   Final   Special Requests BOTTLES DRAWN AEROBIC AND ANAEROBIC 5CC   Final   Culture  Setup Time     Final   Value: 08/15/2013 13:18     Performed at [facility]   Culture     Final   Value: NO GROWTH 5 DAYS     Performed at [facility]   Report Status 08/21/2013 FINAL   Final  CULTURE, BLOOD (ROUTINE X 2)     Status: None   Collection Time    08/15/13 12:50 AM      Result Value Range Status   Specimen Description BLOOD LEFT ARM   Final   Special Requests BOTTLES DRAWN AEROBIC AND ANAEROBIC 5CC   Final   Culture  Setup Time     Final   Value: 08/15/2013 13:18     Performed at [facility]   Culture     Final   Value: NO GROWTH 5 DAYS     Performed at [facility]   Report Status 08/21/2013 FINAL   Final  URINE CULTURE     Status: None   Collection Time    08/15/13  2:37 AM      Result Value Range Status   Specimen Description URINE, CATHETERIZED   Final   Special Requests NONE   Final   Culture  Setup Time     Final   Value:  08/15/2013 16:08     Performed at [facility]     Final   Value: NO GROWTH     Performed at [facility]   Report Status 08/17/2013 FINAL   Final  CLOSTRIDIUM DIFFICILE BY PCR     Status: None   Collection Time    08/15/13  9:04 AM      Result Value Range Status   C difficile by pcr NEGATIVE  NEGATIVE Final   Comment: Performed at [facility]     Scheduled Meds: . acetaminophen  1,000 mg Oral TID  . antiseptic oral rinse  15 mL Mouth Rinse q12n4p  . atorvastatin  40 mg Oral QODAY  . carvedilol  6.25 mg Oral BID WC  . chlorhexidine  15 mL Mouth Rinse BID  . enoxaparin (LOVENOX) injection  150 mg Subcutaneous Q12H  . furosemide  80 mg Oral BID  . insulin aspart  0-20 Units Subcutaneous Q4H  . insulin glargine  65 Units Subcutaneous QHS  . lip balm  1 application Topical BID  . octreotide  100 mcg Subcutaneous TID  .  OxyCODONE  10 mg Oral Q12H  . piperacillin-tazobactam (ZOSYN)  IV  3.375 g Intravenous Q8H  . psyllium  1 packet Oral Daily  . saccharomyces boulardii  250 mg Oral BID  . sodium chloride  3 mL Intravenous Q12H  . vancomycin  2,000 mg Intravenous Q24H   Continuous Infusions: . sodium chloride 10 mL/hr (08/19/13 0010)  . [name] KitchenTPN (CLINIMIX-E) Adult 125 mL/hr at 08/22/13 1730   And  . fat emulsion 250 mL (08/22/13 1730)  . [name] KitchenTPN (CLINIMIX-E) Adult     And  . fat emulsion       Peyton Rossner, DO  [facility] Pager [phone]  If 7PM-7AM, please contact night-coverage [email or web address] Password [email or web address] 08/23/2013, 4:05 PM   LOS: 12 days

## 2013-08-23 NOTE — Progress Notes (Signed)
Pt refused CPAP for tonight. He states that he does not wear CPAP at home and is doing well without it. Pt on 1.5lpm and SpO2=99%. Pt was made aware that if he changes his mind to notify his RN. RT will continue to monitor as needed.

## 2013-08-23 NOTE — Progress Notes (Signed)
PARENTERAL NUTRITION CONSULT NOTE - FOLLOW UP  Pharmacy Consult for TNA Indication: Enterocutaneous fistula  No Known Allergies  Patient Measurements: Height: 6' (182.9 cm) Weight: 332 lb 14.3 oz (151 kg) IBW/kg (Calculated) : 77.6 Adjusted Body Weight: 102.6kg  Vital Signs: Temp: 99 F (37.2 C) (11/17 0501) Temp src: Oral (11/17 0501) BP: 139/66 mmHg (11/17 0501) Pulse Rate: 62 (11/17 0501) Intake/Output from previous day: 11/16 0701 - 11/17 0700 In: 4770.8 [P.O.:180; I.V.:237; IV Piggyback:1150; TPN:2893.8] Out: 1977 [Urine:1650; Drains:327] Intake/Output from this shift: Total I/O In: -  Out: 300 [Urine:300]  Labs:  Recent Labs  08/21/13 0400 08/22/13 0500 08/23/13 0548  WBC 13.4* 12.1* 12.0*  HGB 12.3* 12.0* 12.1*  HCT 36.4* 36.2* 36.2*  PLT 380 365 340     Recent Labs  08/21/13 0400 08/22/13 0500 08/23/13 0548  NA 137 137 139  K 3.5 3.5 3.5  CL 98 97 97  CO2 33* 34* 34*  GLUCOSE 107* 179* 138*  BUN 12 15 20   CREATININE 1.09 1.19 1.13  CALCIUM 9.0 9.3 9.4  MG 1.7 1.7 1.7  PHOS  --  4.2 4.5  PROT  --  6.8 7.0  ALBUMIN  --  2.1* 2.1*  AST  --  19 17  ALT  --  12 12  ALKPHOS  --  28* 29*  BILITOT  --  0.4 0.4   Estimated Creatinine Clearance: 105.2 ml/min (by C-G formula based on Cr of 1.13).    Recent Labs  08/22/13 2353 08/23/13 0332 08/23/13 0744  GLUCAP 158* 135* 153*    Medications:  Scheduled:  . acetaminophen  1,000 mg Oral TID  . atorvastatin  40 mg Oral QODAY  . carvedilol  6.25 mg Oral BID WC  . enoxaparin (LOVENOX) injection  150 mg Subcutaneous Q12H  . furosemide  80 mg Oral BID  . insulin aspart  0-20 Units Subcutaneous Q4H  . insulin glargine  65 Units Subcutaneous QHS  . lip balm  1 application Topical BID  . OxyCODONE  10 mg Oral Q12H  . piperacillin-tazobactam (ZOSYN)  IV  3.375 g Intravenous Q8H  . psyllium  1 packet Oral Daily  . saccharomyces boulardii  250 mg Oral BID  . sodium chloride  3 mL Intravenous  Q12H  . vancomycin  2,000 mg Intravenous Q24H   Infusions:  . sodium chloride 10 mL/hr (08/19/13 0010)  . [name] KitchenTPN (CLINIMIX-E) Adult 125 mL/hr at 08/22/13 1730   And  . fat emulsion 250 mL (08/22/13 1730)    Insulin Requirements in the past 24 hours:  Lantus 65units sq qHS (increasing doses since admit) 29 units SSI required  - CBG q4h with resistant scale coverage 20 units/liter insulin added to TNA 11/16  Nutritional Goals: Per RD 11/13 Kcal: 2400-2700 Protein: 160-180 grams Fluid: 3.3-3.5 L/day  Clinimix E5/15 at goal rate 125 ml/hr (3L/day) to provide 150g protein and 2675 kcal daily - Due to how much fluid can be placed in TNA bag must max out amount at 3L  Current Nutrition:  NPO Clinimix E5/15 at 125 ml/hr and 20% lipids at 49ml/hr   IVF: NS at 66ml/hr  Assessment: 62 yo male with large abdominal abscess with infected old Gore Tex mesh s/p I&D abscess, removal of mesh, debridement of abdominal wall and peritoneum. 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 TNA started 11/12 at 10pm.   Glucose: Hx DM on insulin PTA, CBG improved with addition of insulin to TNA 11/16  Electrolytes: K on low  end of normal and stable - monitor fistula output closely (326ml last 24hr) as this will deplete electrolytes. Na, Mag and Phos WNL  LFTs: wnl, albumin low (2.1)  TGs: wnl, 124 (11/13), pending today  Prealbumin: low, 10.3 (11/13), pending today  Monitor fluid status very closely since TNA at high goal rate - I/O + 2793cc, but weight is not increasing. Octreotide started today to control fistula output.  TPN Access: Triple lumen PICC placed 11/12 TPN day#: 6  Plan:   Continue Clinimix E5/15 at goal rate of 168ml/hr.  20% fat emulsion at 29ml/hr.  Continue insulin 20 units/L to TNA bag  TNA to contain standard multivitamins and trace elements.  IVF per MD  TNA lab panels on Mondays & Thursdays.  F/u clinical progress, ability to start diet and wean TNA   Peggyann Juba, PharmD, BCPS Pager: [phone]  08/23/2013 8:06 AM

## 2013-08-23 NOTE — Progress Notes (Signed)
I have reviewed this note and agree with all findings. Kati Deanette Tullius, PT, DPT Pager: [phone]   

## 2013-08-23 NOTE — Progress Notes (Signed)
9 Days Post-Op  Subjective: Comfortable.  Objective: Vital signs in last 24 hours: Temp:  [98.4 F (36.9 C)-99 F (37.2 C)] 99 F (37.2 C) (11/17 0501) Pulse Rate:  [58-62] 62 (11/17 0501) Resp:  [18-20] 20 (11/17 0501) BP: (137-140)/(55-66) 139/66 mmHg (11/17 0501) SpO2:  [95 %-99 %] 97 % (11/17 0501) Weight:  [332 lb 14.3 oz (151 kg)] 332 lb 14.3 oz (151 kg) (11/17 0501) Last BM Date: 08/18/13 (stool being suctioned out of abd wound fistula wall suction)  Intake/Output from previous day: 11/16 0701 - 11/17 0700 In: 4770.8 [P.O.:180; I.V.:237; IV Piggyback:1150; TPN:2893.8] Out: 1977 [Urine:1650; Drains:327] Intake/Output this shift: Total I/O In: -  Out: 300 [Urine:300]  PE: General- In NAD Abdomen-soft and obese, pouch over wound controlling bilious fistula drainage.  Lab Results:   Recent Labs  08/22/13 0500 08/23/13 0548  WBC 12.1* 12.0*  HGB 12.0* 12.1*  HCT 36.2* 36.2*  PLT 365 340   BMET  Recent Labs  08/22/13 0500 08/23/13 0548  NA 137 139  K 3.5 3.5  CL 97 97  CO2 34* 34*  GLUCOSE 179* 138*  BUN 15 20  CREATININE 1.19 1.13  CALCIUM 9.3 9.4   PT/INR No results found for this basename: LABPROT, INR,  in the last 72 hours Comprehensive Metabolic Panel:    Component Value Date/Time   NA 139 08/23/2013 0548   K 3.5 08/23/2013 0548   CL 97 08/23/2013 0548   CO2 34* 08/23/2013 0548   BUN 20 08/23/2013 0548   CREATININE 1.13 08/23/2013 0548   GLUCOSE 138* 08/23/2013 0548   CALCIUM 9.4 08/23/2013 0548   AST 17 08/23/2013 0548   ALT 12 08/23/2013 0548   ALKPHOS 29* 08/23/2013 0548   BILITOT 0.4 08/23/2013 0548   PROT 7.0 08/23/2013 0548   ALBUMIN 2.1* 08/23/2013 0548     Studies/Results: No results found.  Anti-infectives: Anti-infectives   Start     Dose/Rate Route Frequency Ordered Stop   08/16/13 0800  vancomycin (VANCOCIN) 2,000 mg in sodium chloride 0.9 % 500 mL IVPB     2,000 mg 250 mL/hr over 120 Minutes Intravenous Every 24  hours 08/15/13 1411     08/15/13 1500  vancomycin (VANCOCIN) 2,000 mg in sodium chloride 0.9 % 500 mL IVPB     2,000 mg 250 mL/hr over 120 Minutes Intravenous  Once 08/15/13 1411 08/15/13 1713   08/14/13 1600  piperacillin-tazobactam (ZOSYN) IVPB 3.375 g     3.375 g 12.5 mL/hr over 240 Minutes Intravenous Every 8 hours 08/14/13 1543        Assessment Principal Problem:   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 Active Problems:   Diabetes mellitus, insulin dependent (IDDM), uncontrolled   HYPERLIPIDEMIA   Obesity, Class III, BMI 40-49.9 (morbid obesity)   OBSTRUCTIVE SLEEP APNEA   Infected prosthetic mesh of abdominal wall with GIANT abscess s/p removal 08/14/2013     LOS: 12 days   Plan: Continue TPN and bowel rest.  Start Sandostatin.   Asaph Serena J 08/23/2013

## 2013-08-24 DIAGNOSIS — T889XXS Complication of surgical and medical care, unspecified, sequela: Secondary | ICD-10-CM

## 2013-08-24 LAB — GLUCOSE, CAPILLARY
Glucose-Capillary: 111 mg/dL — ABNORMAL HIGH (ref 70–99)
Glucose-Capillary: 147 mg/dL — ABNORMAL HIGH (ref 70–99)

## 2013-08-24 LAB — VANCOMYCIN, TROUGH: Vancomycin Tr: 13.9 ug/mL (ref 10.0–20.0)

## 2013-08-24 LAB — BASIC METABOLIC PANEL
BUN: 24 mg/dL — ABNORMAL HIGH (ref 6–23)
CO2: 34 mEq/L — ABNORMAL HIGH (ref 19–32)
Calcium: 9.6 mg/dL (ref 8.4–10.5)
Chloride: 93 mEq/L — ABNORMAL LOW (ref 96–112)
Glucose, Bld: 108 mg/dL — ABNORMAL HIGH (ref 70–99)
Potassium: 3.6 mEq/L (ref 3.5–5.1)
Sodium: 133 mEq/L — ABNORMAL LOW (ref 135–145)

## 2013-08-24 LAB — CBC
HCT: 37.1 % — ABNORMAL LOW (ref 39.0–52.0)
Hemoglobin: 12.4 g/dL — ABNORMAL LOW (ref 13.0–17.0)
MCV: 95.9 fL (ref 78.0–100.0)
RBC: 3.87 MIL/uL — ABNORMAL LOW (ref 4.22–5.81)
RDW: 14.5 % (ref 11.5–15.5)
WBC: 12 10*3/uL — ABNORMAL HIGH (ref 4.0–10.5)

## 2013-08-24 MED ORDER — FAT EMULSION 20 % IV EMUL
250.0000 mL | INTRAVENOUS | Status: AC
  Administered 2013-08-24: 250 mL via INTRAVENOUS
  Filled 2013-08-24 (×2): qty 250

## 2013-08-24 MED ORDER — OXYCODONE HCL 5 MG PO TABS
15.0000 mg | ORAL_TABLET | ORAL | Status: DC | PRN
  Administered 2013-08-24 – 2013-10-11 (×99): 15 mg via ORAL
  Filled 2013-08-24 (×102): qty 3

## 2013-08-24 MED ORDER — OXYCODONE HCL ER 15 MG PO T12A
30.0000 mg | EXTENDED_RELEASE_TABLET | Freq: Two times a day (BID) | ORAL | Status: DC
  Administered 2013-08-24 – 2013-08-29 (×10): 30 mg via ORAL
  Filled 2013-08-24 (×10): qty 2

## 2013-08-24 MED ORDER — TRACE MINERALS CR-CU-F-FE-I-MN-MO-SE-ZN IV SOLN
INTRAVENOUS | Status: AC
  Administered 2013-08-24: 17:00:00 via INTRAVENOUS
  Filled 2013-08-24: qty 3000

## 2013-08-24 MED ORDER — CARVEDILOL 3.125 MG PO TABS
3.1250 mg | ORAL_TABLET | Freq: Two times a day (BID) | ORAL | Status: DC
  Administered 2013-08-25 – 2013-10-15 (×102): 3.125 mg via ORAL
  Filled 2013-08-24 (×115): qty 1

## 2013-08-24 NOTE — Progress Notes (Signed)
PARENTERAL NUTRITION CONSULT NOTE - FOLLOW UP  Pharmacy Consult for TNA Indication: Enterocutaneous fistula  No Known Allergies  Patient Measurements: Height: 6' (182.9 cm) Weight: 333 lb 8.9 oz (151.3 kg) IBW/kg (Calculated) : 77.6 Adjusted Body Weight: 102.6kg  Vital Signs: Temp: 98 F (36.7 C) (11/18 0425) Temp src: Oral (11/18 0425) BP: 113/57 mmHg (11/18 0425) Pulse Rate: 59 (11/18 0425) Intake/Output from previous day: 11/17 0701 - 11/18 0700 In: 2278.8 [I.V.:116.2; IV Piggyback:600; TPN:1562.6] Out: 1505 [Urine:1180; Drains:325] Intake/Output from this shift:    Labs:  Recent Labs  08/22/13 0500 08/23/13 0548 08/24/13 0400  WBC 12.1* 12.0* 12.0*  HGB 12.0* 12.1* 12.4*  HCT 36.2* 36.2* 37.1*  PLT 365 340 291     Recent Labs  08/22/13 0500 08/23/13 0548 08/24/13 0400 08/24/13 0730  NA 137 139  --  133*  K 3.5 3.5  --  3.6  CL 97 97  --  93*  CO2 34* 34*  --  34*  GLUCOSE 179* 138*  --  108*  BUN 15 20  --  24*  CREATININE 1.19 1.13  --  1.28  CALCIUM 9.3 9.4  --  9.6  MG 1.7 1.7 1.8  --   PHOS 4.2 4.5  --   --   PROT 6.8 7.0  --   --   ALBUMIN 2.1* 2.1*  --   --   AST 19 17  --   --   ALT 12 12  --   --   ALKPHOS 28* 29*  --   --   BILITOT 0.4 0.4  --   --   PREALBUMIN  --  11.1*  --   --   TRIG  --  89  --   --    Estimated Creatinine Clearance: 93 ml/min (by C-G formula based on Cr of 1.28).    Recent Labs  08/23/13 1954 08/24/13 0015 08/24/13 0722  GLUCAP 155* 158* 111*    Medications:  Scheduled:  . acetaminophen  1,000 mg Oral TID  . antiseptic oral rinse  15 mL Mouth Rinse q12n4p  . atorvastatin  40 mg Oral QODAY  . carvedilol  6.25 mg Oral BID WC  . chlorhexidine  15 mL Mouth Rinse BID  . enoxaparin (LOVENOX) injection  150 mg Subcutaneous Q12H  . furosemide  80 mg Oral BID  . insulin aspart  0-20 Units Subcutaneous Q4H  . insulin glargine  65 Units Subcutaneous QHS  . lip balm  1 application Topical BID  . octreotide   100 mcg Subcutaneous TID  . OxyCODONE  20 mg Oral Q12H  . piperacillin-tazobactam (ZOSYN)  IV  3.375 g Intravenous Q8H  . psyllium  1 packet Oral Daily  . saccharomyces boulardii  250 mg Oral BID  . sodium chloride  3 mL Intravenous Q12H  . vancomycin  2,000 mg Intravenous Q24H   Infusions:  . sodium chloride 10 mL/hr (08/19/13 0010)  . [name] KitchenTPN (CLINIMIX-E) Adult 125 mL/hr at 08/23/13 1703   And  . fat emulsion 250 mL (08/23/13 1703)    Insulin Requirements in the past 24 hours:  Lantus 65units sq qHS (increasing doses since admit) 22 units SSI required  - CBG q4h with resistant scale coverage 60 units regular insulin added to TNA 11/16  Nutritional Goals: Per RD 11/13 Kcal: 2400-2700 Protein: 160-180 grams Fluid: 3.3-3.5 L/day  Clinimix E5/15 at goal rate 125 ml/hr (3L/day) to provide 150g protein and 2675 kcal daily - Due to how  much fluid can be placed in TNA bag must max out amount at 3L  Current Nutrition:  NPO Clinimix E5/15 at 125 ml/hr and 20% lipids at 18ml/hr   IVF: NS at 31ml/hr  Assessment: 62 yo male with large abdominal abscess with infected old [place] mesh s/p I&D abscess, removal of mesh, debridement of abdominal wall and peritoneum. 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 TNA started 11/12 at 10pm.   Glucose: Hx DM on insulin PTA, CBG improved with addition of insulin to TNA 11/16 and increased Lantus doses  Electrolytes: K on low end of normal and stable - monitor fistula output closely (327ml last 24hr) as this will deplete electrolytes. Na slightly low, Mag and Phos WNL  LFTs: wnl, albumin low (2.1)  TGs: wnl, 124 (11/13), 89 (11/17)  Prealbumin: low, 10.3 (11/13), 11.1 (11/17)  Monitor fluid status very closely since TNA at high goal rate.  - I/O documented as + 774cc but inaccurate intake; weight is not increasing. Octreotide started 11/17 to control fistula output.  TPN Access: Triple lumen PICC placed 11/12 TPN day#: 7  Plan:    Continue Clinimix E5/15 at goal rate of 130ml/hr.  20% fat emulsion at 53ml/hr.  Continue insulin 20 units/L in TNA bag and Lantus 65 units qhs  TNA to contain standard multivitamins and trace elements.  IVF per MD - increased NS to 42ml/hr  TNA lab panels on Mondays & Thursdays.  F/u clinical progress, ability to start diet and wean TNA   Peggyann Juba, PharmD, BCPS Pager: [phone]  08/24/2013 8:26 AM

## 2013-08-24 NOTE — Progress Notes (Addendum)
Spoke with patient regarding cpap.  Pt refused cpap.  Pt stated he isn't going to wear it anymore, and that I can take it out of his room.  Machine removed per pt request.  Pt was advised that RT is available all night should he change his mind.  RN aware.

## 2013-08-24 NOTE — Progress Notes (Signed)
[facility] PROGRESS NOTE  Kurt Cross [id number] DOB: 1951-01-12 DOA: 08/11/2013 PCP: [name], MD  Brief History 62 y.o. male with a remote history of bowel perforation requiring exploratory laparotomy and temporary colostomy. He then developed a ventral hernia and underwent mesh repair decades ago. He was recently hospitalized and treated for possible pneumonia. He had a fall at home recently and injured his right lower leg leading to readmission. He was noted to be febrile with some abdominal wall swelling. CT scan revealed a large abdominal wall abscess. 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  He was placed on bowel rest and started on TPN. Early in the admission, the patient was fluid overloaded and received intravenous furosemide. He has been since transitioned back to his home dose of oral furosemide and remains clinically stable from CHF standpoint.   Assessment/Plan: #1 infected prosthetic Gore-Tex mesh with a giant abdominal abscess status post IandD and Gore-Tex mesh removal 08/14/2013  Patient is status post incision and drainage of abscess, Gore-Tex mesh removal, debridement of abdominal wall and peritoneum 08/14/2013.  -empiric IV Vancomycin and Zosyn.  -Blood cultures--neg  -now with EC fistula-->started on sandostatin by surgery 08/23/13  -wound care and TNA per surgery  -abx per ID  -increased long acting oxycontin 20mg  q 12hrs for pain control  -Patient was asking for OxyIR only 2-3 times per day--I have educated the patient that he may ask for up to 6 times per day if pain remains uncontrolled  -CT abdomen if WBC continues to rise  #2 sepsis  -secondary to problem #1.  -Wound cultures prelim with GPC in pairs and clusters-->Coag Neg Staph  -Remains hemodynamically stable  #3 hypotension  -resolved  -secondary to problem #2.  -Increase carvedilol to home dose   #4 atrial fibrillation/atrial flutter  -secondary to problem #1.  -currently rate controlled. Initial EKG showed flutter waves.  -Cardiac enzymes have been negative x3. 2-D echo with EF of 50-55%. -CHADSVASc score is 4 (not ASVD plaque on abd aorta).  -Continue Coreg for rate control.  -Eliquis was discontinued initially as patient went for emergent surgery secondary to problem #1.  -d/c [place] lovenox as pt now has blood from ventral wound -scheduled for TEE cardioversion however in light of problem #1 this was cancelled - F/u with cardiology 2-3 weeks post discharge.  #5 chronic kidney disease stage III  -Creatinine is trending back down. ACE inhibitor had been discontinued.  -Plan to restart ACE inhibitor if renal function remains stable  -Serum creatinine gradually increasing with decreased sodium--suspect increased insensible loss from the patient's large ventral wound -Normal saline started today by CCS--monitor fluid status closely with a history of CHF--may need to decrease furosemide  #6 leukocytosis/fever  -secondary to problem #1.  -resolved--Currently afebrile.  -WBC up a little but remains stable  -Chest x-Jentz is negative for infiltrate. Patient with no respiratory symptoms.  -CT abdomen if WBC continues to rise  #7 chronic combined systolic and diastolic CHF  -Initial CXR with volume overload. given 4 doses of lasix 40mg  IV -Continue home dose furosemide 80 mg po twice a day  -decrease  carvedilol to 3.125mg  bid as BP is now soft -08/12/2013 echocardiogram shows EF 50-55%--EF improved compared to one year ago (35-40%)  #8 Uncontrolled Pain -Increased long acting OxyContin to 30 mg twice a day -Increase OxyIR to 15 mg every 4 hours  prn breakthrough pain #9 hyperlipidemia  Continue statin.  #10 4.5 cm ascending aortic aneurysm per CT scan of 08/01/2013  Stable. Asymptomatic. Continue risk factor modification with good blood pressure control. Will need outpatient followup  with vascular surgery as outpatient.  #11 type 2 diabetes  Hemoglobin A1c was 9.5 on 08/02/2013.Continue Lantus 65 units daily. -Sliding scale insulin.  -CBGs slowly creeping up with TNA  Hypomagnesemia  -Repleted  Family Communication: wife at beside  Disposition Plan: Home when cleared by surgery  Vancomycin 08/15/13>>>  Zosyn 08/15/13>>>            Procedures/Studies: Ct Abdomen Pelvis Wo Contrast  08/14/2013   CLINICAL DATA:  Fever, leukocytosis, prior hernia repairs and appendectomy  EXAM: CT ABDOMEN AND PELVIS WITHOUT CONTRAST  TECHNIQUE: Multidetector CT imaging of the abdomen and pelvis was performed following the standard protocol without intravenous contrast.  COMPARISON:  None.  FINDINGS: Note: Given body habitus, the entire anterior abdominal wall (including a portion of the abnormality) could not be included in the field of view despite two separate attempts.  Prior ventral hernia mesh repair. Just anterior to the mesh, within the anterior abdominal wall, is an abdominal wall fluid collection/abscess measuring approximately 14.5 x 18.1 x 26.1 cm (incompletely visualized). Along the superior aspect of the fluid collection, gas extends to immediately beneath the skin surface (series 10/image 27). Bowel loops are displaced posteriorly by the large collection fluid without definite communication.  A small hernia containing fat and a loop of nondilated colon is present inferiorly (sagittal image 88), to the left of midline (series 10/image 81 and 86).  Minimal left basilar atelectasis.  Cardiomegaly. Coronary atherosclerosis. ICD leads, incompletely visualized.  Moderate hepatic steatosis.  Unenhanced spleen and pancreas are unremarkable.  Low-density nodular thickening of the bilateral adrenal glands, likely reflecting adrenal adenomas.  Gallbladder is notable for layering gallstone. No intrahepatic or extrahepatic ductal dilatation.  Kidneys are unremarkable. No renal calculi or  hydronephrosis.  No evidence of bowel obstruction. Prior appendectomy.  Atherosclerotic calcifications of the abdominal aorta and branch vessels.  No abdominopelvic ascites.  No suspicious abdominopelvic lymphadenopathy.  Prostate is unremarkable.  Bladder is within normal limits.  Degenerative changes of the visualized thoracolumbar spine.  IMPRESSION: 14.5 x 18.1 x 26.1 cm fluid collection/abscess within the anterior abdominal wall, as described above, incompletely visualized. The collection appears just anterior to prior ventral hernia mesh.  Given the size and proximity to the skin surface, incision and drainage is suggested.  These results were called by telephone at the time of interpretation on 08/14/2013 at 3:22 PM to Dr. Irine Seal , who verbally acknowledged these results.   Electronically Signed   By: Julian Hy M.D.   On: 08/14/2013 15:28   Dg Chest 2 View  08/01/2013   CLINICAL DATA:  Chest pain, shortness of breath.  EXAM: CHEST  2 VIEW  COMPARISON:  August 24, 2012.  FINDINGS: Stable cardiomegaly. Left-sided pacemaker is noted. No pleural effusion or pneumothorax is noted. Mild central pulmonary vascular congestion is noted which appears to be chronic. No acute pulmonary disease is noted.  IMPRESSION: No active cardiopulmonary disease.   Electronically Signed   By: Sabino Dick M.D.   On: 08/01/2013 17:06   Dg Hip Complete Right  08/11/2013   CLINICAL DATA:  Right posterior hip pain.  EXAM: RIGHT HIP - COMPLETE 2+ VIEW  COMPARISON:  None.  FINDINGS: There is no evidence of hip fracture or dislocation. There is no evidence of arthropathy  or other focal bone abnormality.  IMPRESSION: No acute osseous injury of the right hip.   Electronically Signed   By: Kathreen Devoid   On: 08/11/2013 15:16   Ct Angio Chest Pe W/cm &/or Wo Cm  08/01/2013   CLINICAL DATA:  Shortness of Breath,  EXAM: CT ANGIOGRAPHY CHEST WITH CONTRAST  TECHNIQUE: Multidetector CT imaging of the chest was performed  using the standard protocol during bolus administration of intravenous contrast. Multiplanar CT image reconstructions including MIPs were obtained to evaluate the vascular anatomy.  CONTRAST:  161mL OMNIPAQUE IOHEXOL 350 MG/ML SOLN  COMPARISON:  06/28/2012  FINDINGS: There is fairly good contrast opacification of the pulmonary artery branches with no convincing filling defects to suggest acute PE. There is good contrast opacification of the thoracic aorta with some scattered plaque, no evidence of dissection or stenosis. There is dilatation of the ascending aorta measuring up to 4.5 cm transverse diameter, stable by my measurement since prior study. Bovine brachiocephalic arterial origin anatomy without proximal stenosis. No pleural or pericardial effusion. No hilar or mediastinal adenopathy. Transvenous pacing leads are noted. Minimal linear scarring or subsegmental atelectasis in both lower lobes. Lungs otherwise clear. Visualized portions of upper abdomen unremarkable. Multiple bridging osteophytes throughout the mid and lower thoracic spine. Sternum intact.  Review of the MIP images confirms the above findings.  IMPRESSION: 1. 4.5 cm ascending aortic aneurysm without complicating features. 2. No evidence of acute pulmonary embolism or aortic dissection.   Electronically Signed   By: Arne Cleveland M.D.   On: 08/01/2013 19:55   Dg Chest Port 1 View  08/18/2013   CLINICAL DATA:  Confirm line placement  EXAM: PORTABLE CHEST - 1 VIEW  COMPARISON:  08/17/2013  FINDINGS: Two images were taken. The patient is in semi-upright position and is slightly rotated to the right. A right upper extremity PICC has been placed, the distal tip of which is in the mid superior vena cava. Left chest wall triple lead pacemaker appears stable. Cardiomegaly. There is diffuse pulmonary vascular congestion and similar appearance of pulmonary edema. No pneumothorax or pleural effusion is appreciated.  IMPRESSION: 1. Right upper  extremity PICC terminates in the mid superior vena cava. 2. Stable congestive heart failure pattern.   Electronically Signed   By: Curlene Dolphin M.D.   On: 08/18/2013 21:39   Dg Chest Port 1 View  08/17/2013   CLINICAL DATA:  Respiratory failure  EXAM: PORTABLE CHEST - 1 VIEW  COMPARISON:  08/15/2013.  FINDINGS: Persistent cardiomegaly with pulmonary vascular prominence and interstitial prominence suggesting congestive heart failure with interstitial edema noted. Interstitial pneumonitis cannot be excluded. Cardiac pacer. Lead tips in stable position. No pneumothorax. No acute osseous abnormality.  IMPRESSION: Stable chest with changes of congestive heart failure with pulmonary interstitial edema. Cardiac pacer in stable position.   Electronically Signed   By: Marcello Moores  Register   On: 08/17/2013 07:41   Dg Chest Portable 1 View  08/15/2013   CLINICAL DATA:  Sepsis, respiratory distress  EXAM: PORTABLE CHEST - 1 VIEW  COMPARISON:  Prior radiograph from 08/13/2013  FINDINGS: Left-sided pacemaker is unchanged. Cardiomegaly is stable as compared to the prior exam.  Lungs are normally inflated. There has been worsening of pulmonary vascular congestion and edema as compared to the prior examination. No definite pleural effusion. No focal infiltrates appreciated. No pneumothorax.  Osseous structures are unchanged.  IMPRESSION: Stable cardiomegaly with interval worsening of pulmonary vascular congestion/ edema relative to 08/13/2013.   Electronically Signed  By: Jeannine Boga M.D.   On: 08/15/2013 01:16   Dg Chest Port 1 View  08/13/2013   CLINICAL DATA:  Leukocytosis.  EXAM: PORTABLE CHEST - 1 VIEW  COMPARISON:  08/01/2013  FINDINGS: No focal pulmonary consolidation is identified. There is stable mild pulmonary venous prominence without overt edema or visualized pleural fluid. The heart remains mildly enlarged. There is stable appearance of a biventricular pacemaker.  IMPRESSION: No acute findings. Stable  cardiomegaly and pulmonary venous prominence without overt edema.   Electronically Signed   By: Aletta Edouard M.D.   On: 08/13/2013 15:44         Subjective: Patient continues to complain of abdominal pain. Denies fevers, chills, chest discomfort, shortness breath, nausea, vomiting and diarrhea. Patient has small bowel movement yesterday. Denies any headache or visual changes. Denies any dysuria.  Objective: Filed Vitals:   08/24/13 0425 08/24/13 0745 08/24/13 0845 08/24/13 1342  BP: 113/57 109/62 110/65 106/46  Pulse: 59 64 68 89  Temp: 98 F (36.7 C) 97.2 F (36.2 C)  98.3 F (36.8 C)  TempSrc: Oral Oral  Oral  Resp: 18 18  17   Height:      Weight: 151.3 kg (333 lb 8.9 oz)     SpO2: 97% 99%  96%    Intake/Output Summary (Last 24 hours) at 08/24/13 1625 Last data filed at 08/24/13 1500  Gross per 24 hour  Intake 1476.75 ml  Output   1990 ml  Net -513.25 ml   Weight change: 0.3 kg (10.6 oz) Exam:   General:  Pt is alert, follows commands appropriately, not in acute distress  HEENT: No icterus, No thrush,  [place]/AT  Cardiovascular: RRR, S1/S2, no rubs, no gallops  Respiratory: CTA bilaterally, no wheezing, no crackles, no rhonchi Abdomen: Soft/+BS, non tender, non distended, no guarding; large ventral wound with ostomy bag; no erythema--no necrosis, no crepitance    Extremities: trace LE edema, No lymphangitis, No petechiae, No rashes, no synovitis  Data Reviewed: Basic Metabolic Panel:  Recent Labs Lab 08/19/13 0435 08/20/13 0528 08/21/13 0400 08/22/13 0500 08/23/13 0548 08/24/13 0400 08/24/13 0730  NA 138 140 137 137 139  --  133*  K 3.8 3.5 3.5 3.5 3.5  --  3.6  CL 101 99 98 97 97  --  93*  CO2 30 34* 33* 34* 34*  --  34*  GLUCOSE 103* 107* 107* 179* 138*  --  108*  BUN 14 12 12 15 20   --  24*  CREATININE 1.08 1.06 1.09 1.19 1.13  --  1.28  CALCIUM 9.2 8.9 9.0 9.3 9.4  --  9.6  MG 1.5 1.4* 1.7 1.7 1.7 1.8  --   PHOS 3.9 3.7  --  4.2 4.5  --    --    Liver Function Tests:  Recent Labs Lab 08/19/13 0435 08/22/13 0500 08/23/13 0548  AST 36 19 17  ALT 21 12 12   ALKPHOS 37* 28* 29*  BILITOT 0.5 0.4 0.4  PROT 6.8 6.8 7.0  ALBUMIN 2.0* 2.1* 2.1*   No results found for this basename: LIPASE, AMYLASE,  in the last 168 hours No results found for this basename: AMMONIA,  in the last 168 hours CBC:  Recent Labs Lab 08/18/13 0645 08/19/13 0435 08/20/13 0528 08/21/13 0400 08/22/13 0500 08/23/13 0548 08/24/13 0400  WBC 11.4* 11.2* 10.3 13.4* 12.1* 12.0* 12.0*  NEUTROABS 8.5* 7.9*  --   --   --  8.7*  --   HGB 12.4*  12.1* 11.9* 12.3* 12.0* 12.1* 12.4*  HCT 37.3* 36.7* 36.0* 36.4* 36.2* 36.2* 37.1*  MCV 96.6 97.3 97.8 96.6 96.5 96.0 95.9  PLT 402* 392 374 380 365 340 291   Cardiac Enzymes: No results found for this basename: CKTOTAL, CKMB, CKMBINDEX, TROPONINI,  in the last 168 hours BNP: No components found with this basename: POCBNP,  CBG:  Recent Labs Lab 08/24/13 0015 08/24/13 0420 08/24/13 0722 08/24/13 1156 08/24/13 1558  GLUCAP 158* 163* 111* 147* 183*    Recent Results (from the past 240 hour(s))  SURGICAL PCR SCREEN     Status: None   Collection Time    08/14/13  4:35 PM      Result Value Range Status   MRSA, PCR NEGATIVE  NEGATIVE Final   Staphylococcus aureus NEGATIVE  NEGATIVE Final   Comment:            The Xpert SA Assay (FDA     approved for NASAL specimens     in patients over 58 years of age),     is one component of     a comprehensive surveillance     program.  Test performance has     been validated by [employer] for patients greater     than or equal to 11 year old.     It is not intended     to diagnose infection nor to     guide or monitor treatment.  CULTURE, ROUTINE-ABSCESS     Status: None   Collection Time    08/14/13  6:28 PM      Result Value Range Status   Specimen Description ABDOMEN INCISION DRAINAGE   Final   Special Requests NONE   Final   Gram Stain      Final   Value: ABUNDANT WBC PRESENT, PREDOMINANTLY PMN     NO SQUAMOUS EPITHELIAL CELLS SEEN     MODERATE GRAM POSITIVE COCCI IN PAIRS     IN CLUSTERS     Performed at [facility]   Culture     Final   Value: RARE STAPHYLOCOCCUS SPECIES (COAGULASE NEGATIVE)     Performed at [facility]   Report Status 08/19/2013 FINAL   Final  ANAEROBIC CULTURE     Status: None   Collection Time    08/14/13  6:28 PM      Result Value Range Status   Specimen Description ABDOMEN INCISION DRAINAGE   Final   Special Requests NONE   Final   Gram Stain     Final   Value: ABUNDANT WBC PRESENT, PREDOMINANTLY PMN     NO SQUAMOUS EPITHELIAL CELLS SEEN     FEW GRAM POSITIVE COCCI     IN PAIRS IN CLUSTERS     Performed at [facility]   Culture     Final   Value: NO ANAEROBES ISOLATED     Performed at [facility]   Report Status 08/20/2013 FINAL   Final  CULTURE, BLOOD (ROUTINE X 2)     Status: None   Collection Time    08/15/13 12:40 AM      Result Value Range Status   Specimen Description BLOOD LEFT ARM   Final   Special Requests BOTTLES DRAWN AEROBIC AND ANAEROBIC 5CC   Final   Culture  Setup Time     Final   Value: 08/15/2013 13:18     Performed at [facility]  Final   Value: NO GROWTH 5 DAYS     Performed at [facility]   Report Status 08/21/2013 FINAL   Final  CULTURE, BLOOD (ROUTINE X 2)     Status: None   Collection Time    08/15/13 12:50 AM      Result Value Range Status   Specimen Description BLOOD LEFT ARM   Final   Special Requests BOTTLES DRAWN AEROBIC AND ANAEROBIC 5CC   Final   Culture  Setup Time     Final   Value: 08/15/2013 13:18     Performed at [facility]   Culture     Final   Value: NO GROWTH 5 DAYS     Performed at [facility]   Report Status 08/21/2013 FINAL   Final  URINE CULTURE     Status: None   Collection Time    08/15/13  2:37 AM      Result Value Range Status   Specimen  Description URINE, CATHETERIZED   Final   Special Requests NONE   Final   Culture  Setup Time     Final   Value: 08/15/2013 16:08     Performed at [facility]   Culture     Final   Value: NO GROWTH     Performed at [facility]   Report Status 08/17/2013 FINAL   Final  CLOSTRIDIUM DIFFICILE BY PCR     Status: None   Collection Time    08/15/13  9:04 AM      Result Value Range Status   C difficile by pcr NEGATIVE  NEGATIVE Final   Comment: Performed at [facility]     Scheduled Meds: . acetaminophen  1,000 mg Oral TID  . antiseptic oral rinse  15 mL Mouth Rinse q12n4p  . atorvastatin  40 mg Oral QODAY  . carvedilol  6.25 mg Oral BID WC  . chlorhexidine  15 mL Mouth Rinse BID  . enoxaparin (LOVENOX) injection  150 mg Subcutaneous Q12H  . furosemide  80 mg Oral BID  . insulin aspart  0-20 Units Subcutaneous Q4H  . insulin glargine  65 Units Subcutaneous QHS  . lip balm  1 application Topical BID  . octreotide  100 mcg Subcutaneous TID  . OxyCODONE  20 mg Oral Q12H  . piperacillin-tazobactam (ZOSYN)  IV  3.375 g Intravenous Q8H  . psyllium  1 packet Oral Daily  . saccharomyces boulardii  250 mg Oral BID  . sodium chloride  3 mL Intravenous Q12H  . vancomycin  2,000 mg Intravenous Q24H   Continuous Infusions: . sodium chloride 50 mL/hr at 08/24/13 1431  . [name] KitchenTPN (CLINIMIX-E) Adult 125 mL/hr at 08/23/13 1703   And  . fat emulsion 250 mL (08/23/13 1703)  . [name] KitchenTPN (CLINIMIX-E) Adult     And  . fat emulsion       Jakia Kennebrew, DO  [facility] Pager [phone]  If 7PM-7AM, please contact night-coverage [email or web address] Password [email or web address] 08/24/2013, 4:25 PM   LOS: 13 days

## 2013-08-24 NOTE — Progress Notes (Signed)
10 Days Post-Op  Subjective: One drainage tube fell out.  Objective: Vital signs in last 24 hours: Temp:  [98 F (36.7 C)-98.1 F (36.7 C)] 98 F (36.7 C) (11/18 0425) Pulse Rate:  [59-68] 68 (11/18 0845) Resp:  [18] 18 (11/18 0425) BP: (110-147)/(57-77) 110/65 mmHg (11/18 0845) SpO2:  [97 %] 97 % (11/18 0425) Weight:  [333 lb 8.9 oz (151.3 kg)] 333 lb 8.9 oz (151.3 kg) (11/18 0425) Last BM Date: 08/18/13 (stool being suctioned out of abd wound fistula wall suction)  Intake/Output from previous day: 11/17 0701 - 11/18 0700 In: 2278.8 [I.V.:116.2; IV Piggyback:600; TPN:1562.6] Out: [id number] [Urine:1180; Drains:325] Intake/Output this shift:    PE: General- In NAD Abdomen-soft, open wound with granulation tissue and fistula; some bloody drainage from wound  Lab Results:   Recent Labs  08/23/13 0548 08/24/13 0400  WBC 12.0* 12.0*  HGB 12.1* 12.4*  HCT 36.2* 37.1*  PLT 340 291   BMET  Recent Labs  08/23/13 0548 08/24/13 0730  NA 139 133*  K 3.5 3.6  CL 97 93*  CO2 34* 34*  GLUCOSE 138* 108*  BUN 20 24*  CREATININE 1.13 1.28  CALCIUM 9.4 9.6   PT/INR No results found for this basename: LABPROT, INR,  in the last 72 hours Comprehensive Metabolic Panel:    Component Value Date/Time   NA 133* 08/24/2013 0730   K 3.6 08/24/2013 0730   CL 93* 08/24/2013 0730   CO2 34* 08/24/2013 0730   BUN 24* 08/24/2013 0730   CREATININE 1.28 08/24/2013 0730   GLUCOSE 108* 08/24/2013 0730   CALCIUM 9.6 08/24/2013 0730   AST 17 08/23/2013 0548   ALT 12 08/23/2013 0548   ALKPHOS 29* 08/23/2013 0548   BILITOT 0.4 08/23/2013 0548   PROT 7.0 08/23/2013 0548   ALBUMIN 2.1* 08/23/2013 0548     Studies/Results: No results found.  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  Continue other management.   Raivyn Kabler J 08/24/2013

## 2013-08-24 NOTE — Consult Note (Addendum)
WOC wound consult note Reason for Consult:Large open abdominal wound; EC fistula. Dressing (pouch) leaking.  Patient uncomfortable. Requires larger bed, trapeze.  Interdry Ag+ for intertriginous dermatitis beneath abdominal pannus. Wound type:Open wound with EC fistula Pressure Ulcer POA: No Measurement:23cm x 9cm x 2.5cm.  Undermining to 8cm at 3 o'clock, 4cm at 6 o'clock and 5cm at 9 o'clock  Wound bed:No evidence of silicone sheeting in wound bed.  Two RNs explored wound with sterile tip applicators and no evidence of silicone found.  Three silicone sheets (Mepitel) placed over wound bed and tucked lightly into gulleys.  Some overlap onto intact skin provided.  Eakin skin barrier rings placed as "barriers" around wound periphery.  [place] W[facility] pouch applied and attached to suction via [place] Robinson catheter using wall suction at low continuous suction (approximately 23mmHg negative pressure placed at 4 o'clock position.  A good seal is achieved. Drainage (amount, consistency, odor) Blood tinged effluent Periwound:intact with erythema, but no warmth or induration from 5-8 o'clock.  Orange peel presentation of tissue. Dressing procedure/placement/frequency:As above. Dressing into old colostomy site changed today and cleansed.  New saline dampened dressing placed. [facility] team will continue to follow along with you. Thanks, Maudie Flakes, MSN, RN, [place], [place], [place] [phone])

## 2013-08-24 NOTE — Progress Notes (Signed)
ANTIBIOTIC CONSULT NOTE   Pharmacy Consult for Zosyn/Vancomycin Indication: intra-abdominal abscess  No Known Allergies  Patient Measurements: Height: 6' (182.9 cm) Weight: 333 lb 8.9 oz (151.3 kg) IBW/kg (Calculated) : 77.6  Vital Signs: Temp: 98 F (36.7 C) (11/18 0425) Temp src: Oral (11/18 0425) BP: 113/57 mmHg (11/18 0425) Pulse Rate: 59 (11/18 0425)  Labs:  Recent Labs  08/22/13 0500 08/23/13 0548 08/24/13 0400 08/24/13 0730  WBC 12.1* 12.0* 12.0*  --   HGB 12.0* 12.1* 12.4*  --   PLT 365 340 291  --   CREATININE 1.19 1.13  --  1.28   Vancomycin trough 08/18/13:  12.3 mcg/ml on 2g q24h Vancomycin trough 08/24/13:  13.9 mcg/ml on 2g q24h  Antibiotics and culture results: 11/8 >> Zosyn >>  11/9 >> Vancomycin>> ==================== 11/5 urine >> NGF 11/7 urine >> NGF 11/7 blood x2>> NGTD 11/8: abdomen incision drainage: rare coag neg staph 11/9: C. Diff: negative 11/9: blood x 2: NGF 11/9: urine: NGF  Goal vancomycin trough 15-20 mcg/ml for abscess  Assessment: 62 y/o M presented with fluid collection / abscess within anterior abdominal wall.  On 08/14/13 underwent I&D with removal of infected prosthetic Gore-tex mesh and evacuation of 3L purulent fluid.    Now on D#11 Zosyn 3.375 grams IV q8h   D#10 Vancomycin 2 grams IV q24h of planned 2-3 weeks per ID  Afebrile, WBC up slightly  Vancomycin trough slightly subtherapeutic but with rising SCr, will continue same dose as further accumulation expected   Plan:  1. Continue Vancomycin 2 gm IV q24h 2. Continue Zosyn 3.375 gm IV q8h (extended infusion, each dose over 4 hours) 3. Monitor SCr closely and adjust doses as needed   Peggyann Juba, PharmD, BCPS Pager: [phone] 08/24/2013 8:39 AM

## 2013-08-24 NOTE — Progress Notes (Signed)
Occupational Therapy Treatment Patient Details Name: Kurt Cross MRN: [id number] DOB: 14-Nov-1950 Today's Date: 08/24/2013 Time: [id number] OT Time Calculation (min): 45 min  OT Assessment / Plan / Recommendation  History of present illness Kurt Cross is a 62 y.o. male  with known history of chronic systolic heart failure status post defibrillator placement last EF measured was in 2013 was 35-40%, on home oxygen, OSA noncompliant with CPAP, diabetes mellitus, hypertension and hyperlipidemia presented to the ER because right leg pain after sustaining a fall the night prior to admission.  08/14/13: s/p abdominal abscess removal   OT comments  Pt. Awake upon arrival.  Initially hesitant to oob secondary to reported bag/drainage issues from last night.  Pt. Able to return demo of a/e eob, and completed pivot transfer to recliner.  Doing well today, and once up requested to sit in recliner.    Follow Up Recommendations  SNF;Supervision/Assistance - 24 hour           Equipment Recommendations  3 in 1 bedside comode        Frequency Min 2X/week   Progress towards OT Goals Progress towards OT goals: Progressing toward goals  Plan Discharge plan remains appropriate    Precautions / Restrictions Precautions Precautions: Fall;Other (comment) (ostomy bag on r side, multiple ivs and lines)   Pertinent Vitals/Pain 3/10, repositioned and reviewed asking for pain meds prn    ADL  Lower Body Bathing: Simulated;Set up Where Assessed - Lower Body Bathing: Unsupported sitting Lower Body Dressing: Performed;Set up Where Assessed - Lower Body Dressing: Unsupported sitting Toilet Transfer: Simulated;Min guard;Minimal assistance Toilet Transfer Method: Sit to stand;Stand pivot Toileting - Clothing Manipulation and Hygiene: Performed;Simulated;Min guard Where Assessed - Camera operator Manipulation and Hygiene: Lean right and/or left;Standing Transfers/Ambulation Related to ADLs: min a to provide  support on rw so pt. can transition into standing.  able to stand pivot to recliner for sim. toilet transfer with min guard a, and iv pole management ADL Comments: able to use urinal eob.  performed sim. bsc transfer min guard.  able to don/doff socks with use of a/e with set up and int. cues for tech.  used reacher and sock aide for lb dressing and reviewed use of LH sponge and shoe horn       OT Goals(current goals can now be found in the care plan section)    Visit Information  Last OT Received On: 08/24/13 History of Present Illness: Kurt Cross is a 62 y.o. male  with known history of chronic systolic heart failure status post defibrillator placement last EF measured was in 2013 was 35-40%, on home oxygen, OSA noncompliant with CPAP, diabetes mellitus, hypertension and hyperlipidemia presented to the ER because right leg pain after sustaining a fall the night prior to admission.  08/14/13: s/p abdominal abscess removal    Subjective Data   "i know how all this stuff works, you gotta just trust me" (pt. Scooting eob and positioning self for sit/stand from eob)          Cognition  Cognition Arousal/Alertness: Awake/alert Behavior During Therapy: WFL for tasks assessed/performed Overall Cognitive Status: Within Functional Limits for tasks assessed    Mobility  Bed Mobility Details for Bed Mobility Assistance: s for safety with lines and tubes.  cues to scoot to eob and equally distribute weight through hips/feet Transfers Transfers: Sit to Stand;Stand to Sit Sit to Stand: 4: Min assist;4: Min guard;From elevated surface;From bed;With upper extremity assist Stand to Sit: 4:  Min guard;4: Min assist;With upper extremity assist;With armrests;To chair/3-in-1               End of Session OT - End of Session Equipment Utilized During Treatment: Rolling walker Activity Tolerance: Patient tolerated treatment well Patient left: in chair;with call bell/phone within reach        Janice Coffin, COTA/L 08/24/2013, 8:12 AM

## 2013-08-25 LAB — BASIC METABOLIC PANEL
BUN: 27 mg/dL — ABNORMAL HIGH (ref 6–23)
Calcium: 9.2 mg/dL (ref 8.4–10.5)
GFR calc Af Amer: 68 mL/min — ABNORMAL LOW (ref >90)
GFR calc non Af Amer: 59 mL/min — ABNORMAL LOW (ref >90)
Glucose, Bld: 93 mg/dL (ref 70–99)
Potassium: 3.5 mEq/L (ref 3.5–5.1)
Sodium: 133 mEq/L — ABNORMAL LOW (ref 135–145)

## 2013-08-25 LAB — GLUCOSE, CAPILLARY
Glucose-Capillary: 161 mg/dL — ABNORMAL HIGH (ref 70–99)
Glucose-Capillary: 104 mg/dL — ABNORMAL HIGH (ref 70–99)
Glucose-Capillary: 98 mg/dL (ref 70–99)
Glucose-Capillary: 131 mg/dL — ABNORMAL HIGH (ref 70–99)
Glucose-Capillary: 136 mg/dL — ABNORMAL HIGH (ref 70–99)

## 2013-08-25 LAB — CBC
Hemoglobin: 12 g/dL — ABNORMAL LOW (ref 13.0–17.0)
MCHC: 33.1 g/dL (ref 30.0–36.0)
RBC: 3.77 MIL/uL — ABNORMAL LOW (ref 4.22–5.81)
WBC: 11.2 10*3/uL — ABNORMAL HIGH (ref 4.0–10.5)

## 2013-08-25 MED ORDER — TRACE MINERALS CR-CU-F-FE-I-MN-MO-SE-ZN IV SOLN
INTRAVENOUS | Status: AC
  Administered 2013-08-25: 17:00:00 via INTRAVENOUS
  Filled 2013-08-25: qty 3000

## 2013-08-25 MED ORDER — POLYVINYL ALCOHOL 1.4 % OP SOLN
2.0000 [drp] | OPHTHALMIC | Status: DC | PRN
  Filled 2013-08-25 (×2): qty 15

## 2013-08-25 MED ORDER — FAT EMULSION 20 % IV EMUL
240.0000 mL | INTRAVENOUS | Status: AC
  Administered 2013-08-25: 240 mL via INTRAVENOUS
  Filled 2013-08-25: qty 250

## 2013-08-25 NOTE — Progress Notes (Signed)
OT Cancellation Note  Patient Details Name: Kurt Cross MRN: [id number] DOB: 1951-10-03   Cancelled Treatment:    Reason Eval/Treat Not Completed: Pain limiting ability to participate. Pt states pain is 10/10 and cant get up right now. Nursing student in room states due for pain meds and can give. Will try back at a later time.  Jules Schick [id number] 08/25/2013, 10:32 AM

## 2013-08-25 NOTE — Progress Notes (Signed)
11 Days Post-Op  Subjective: Pouch re-applied yesterday.  No complaints.  Objective: Vital signs in last 24 hours: Temp:  [98.3 F (36.8 C)-98.6 F (37 C)] 98.5 F (36.9 C) (11/19 0352) Pulse Rate:  [59-89] 59 (11/19 0352) Resp:  [16-22] 16 (11/19 0352) BP: (106-128)/(46-63) 128/63 mmHg (11/19 0352) SpO2:  [96 %-98 %] 98 % (11/19 0352) Weight:  [333 lb 12.4 oz (151.4 kg)] 333 lb 12.4 oz (151.4 kg) (11/19 0352) Last BM Date: 08/18/13  Intake/Output from previous day: 11/18 0701 - 11/19 0700 In: 1346.8 [P.O.:180; I.V.:1166.8] Out: 2485 [Urine:2485] Intake/Output this shift:    PE: General- In NAD Abdomen-soft, open wound with less bloody drainage Lab Results:   Recent Labs  08/24/13 0400 08/25/13 0750  WBC 12.0* 11.2*  HGB 12.4* 12.0*  HCT 37.1* 36.2*  PLT 291 326   BMET  Recent Labs  08/24/13 0730 08/25/13 0750  NA 133* 133*  K 3.6 3.5  CL 93* 93*  CO2 34* 31  GLUCOSE 108* 93  BUN 24* 27*  CREATININE 1.28 1.29  CALCIUM 9.6 9.2   PT/INR No results found for this basename: LABPROT, INR,  in the last 72 hours Comprehensive Metabolic Panel:    Component Value Date/Time   NA 133* 08/25/2013 0750   K 3.5 08/25/2013 0750   CL 93* 08/25/2013 0750   CO2 31 08/25/2013 0750   BUN 27* 08/25/2013 0750   CREATININE 1.29 08/25/2013 0750   GLUCOSE 93 08/25/2013 0750   CALCIUM 9.2 08/25/2013 0750   AST 17 08/23/2013 0548   ALT 12 08/23/2013 0548   ALKPHOS 29* 08/23/2013 0548   BILITOT 0.4 08/23/2013 0548   PROT 7.0 08/23/2013 0548   ALBUMIN 2.1* 08/23/2013 0548     Studies/Results: No results found.  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 Diabetes mellitus, insulin dependent (IDDM), uncontrolled  HYPERLIPIDEMIA  Obesity, Class III, BMI 40-49.9 (morbid obesity)  OBSTRUCTIVE SLEEP APNEA  Infected prosthetic mesh of abdominal wall with GIANT abscess s/p removal 08/14/2013 Acute renal insufficiency from fistula/wound loss    LOS: 14 days   Plan: Continue present management.   Kurt Cross J 08/25/2013

## 2013-08-25 NOTE — Progress Notes (Signed)
[facility] PROGRESS NOTE  Kurt Cross [id number] DOB: 1951-06-11 DOA: 08/11/2013 PCP: [name], MD  Brief History 62 y.o. male with a remote history of bowel perforation requiring exploratory laparotomy and temporary colostomy. He then developed a ventral hernia and underwent mesh repair decades ago. He was recently hospitalized and treated for possible pneumonia. He had a fall at home recently and injured his right lower leg leading to readmission. He was noted to be febrile with some abdominal wall swelling. CT scan revealed a large abdominal wall abscess. 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  He was placed on bowel rest and started on TPN. Early in the admission, the patient was fluid overloaded and received intravenous furosemide. He has been since transitioned back to his home dose of oral furosemide and remains clinically stable from CHF standpoint.   Assessment/Plan: #1 infected prosthetic Gore-Tex mesh with a giant abdominal abscess status post IandD and Gore-Tex mesh removal 08/14/2013  Patient is status post incision and drainage of abscess, Gore-Tex mesh removal, debridement of abdominal wall and peritoneum 08/14/2013.  -empiric IV Vancomycin and Zosyn.  -Blood cultures--neg  -now with EC fistula-->started on sandostatin by surgery 08/23/13  -wound care and TNA per surgery  -abx per ID  -increased long acting oxycontin 20mg  q 12hrs for pain control  -Patient was asking for OxyIR only 2-3 times per day--I have educated the patient that he may ask for up to 6 times per day if pain remains uncontrolled  -CT abdomen if WBC continues to rise  #2 sepsis  -secondary to problem #1.  -Wound cultures prelim with GPC in pairs and clusters-->Coag Neg Staph  -Remains hemodynamically stable  #3 hypotension  -resolved  -secondary to problem #2.  -Increased carvedilol to home dose   #4 atrial fibrillation/atrial flutter  -secondary to problem #1.  -currently rate controlled. Initial EKG showed flutter waves.  -Cardiac enzymes have been negative x3. 2-D echo with EF of 50-55%. -CHADSVASc score is 4 (not ASVD plaque on abd aorta).  -Continue Coreg for rate control.  -Eliquis was discontinued initially as patient went for emergent surgery secondary to problem #1.  -d/c Daniel lovenox as pt now has blood from ventral wound -scheduled for TEE cardioversion however in light of problem #1 this was cancelled - F/u with cardiology 2-3 weeks post discharge.  #5 chronic kidney disease stage III  -Creatinine is trending back down. ACE inhibitor had been discontinued.  -Plan to restart ACE inhibitor if renal function remains stable  -Serum creatinine gradually increasing with decreased sodium--suspect increased insensible loss from the patient's large ventral wound -Normal saline started today by CCS--monitor fluid status closely with a history of CHF--may need to decrease furosemide  #6 leukocytosis/fever  -secondary to problem #1.  -resolved--Currently afebrile.  -WBC up a little but remains stable  -Chest x-Doris is negative for infiltrate. Patient with no respiratory symptoms.  -CT abdomen if WBC continues to rise  #7 chronic combined systolic and diastolic CHF  -Initial CXR with volume overload. given 4 doses of lasix 40mg  IV -Continue home dose furosemide 80 mg po twice a day  -decreased  carvedilol to 3.125mg  bid as BP was soft -08/12/2013 echocardiogram shows EF 50-55%--EF improved compared to one year ago (35-40%)  #8 Uncontrolled Pain -Increased long acting OxyContin to 30 mg twice a day -Increase OxyIR to 15 mg every 4 hours prn  breakthrough pain #9 hyperlipidemia  Continue statin.  #10 4.5 cm ascending aortic aneurysm per CT scan of 08/01/2013  Stable. Asymptomatic. Continue risk factor modification with good blood pressure control. Will need outpatient followup with  vascular surgery as outpatient.  #11 type 2 diabetes  Hemoglobin A1c was 9.5 on 08/02/2013.Continue Lantus 65 units daily. -Sliding scale insulin.  -CBGs slowly creeping up with TNA  Hypomagnesemia  -Repleted    Family Communication: Updated patient no family at beside  Disposition Plan: Home when cleared by surgery     Antibiotics Vancomycin 08/15/13>>>  Zosyn 08/14/13>>>               Procedures/Studies: Ct Abdomen Pelvis Wo Contrast  08/14/2013   CLINICAL DATA:  Fever, leukocytosis, prior hernia repairs and appendectomy  EXAM: CT ABDOMEN AND PELVIS WITHOUT CONTRAST  TECHNIQUE: Multidetector CT imaging of the abdomen and pelvis was performed following the standard protocol without intravenous contrast.  COMPARISON:  None.  FINDINGS: Note: Given body habitus, the entire anterior abdominal wall (including a portion of the abnormality) could not be included in the field of view despite two separate attempts.  Prior ventral hernia mesh repair. Just anterior to the mesh, within the anterior abdominal wall, is an abdominal wall fluid collection/abscess measuring approximately 14.5 x 18.1 x 26.1 cm (incompletely visualized). Along the superior aspect of the fluid collection, gas extends to immediately beneath the skin surface (series 10/image 27). Bowel loops are displaced posteriorly by the large collection fluid without definite communication.  A small hernia containing fat and a loop of nondilated colon is present inferiorly (sagittal image 88), to the left of midline (series 10/image 81 and 86).  Minimal left basilar atelectasis.  Cardiomegaly. Coronary atherosclerosis. ICD leads, incompletely visualized.  Moderate hepatic steatosis.  Unenhanced spleen and pancreas are unremarkable.  Low-density nodular thickening of the bilateral adrenal glands, likely reflecting adrenal adenomas.  Gallbladder is notable for layering gallstone. No intrahepatic or extrahepatic ductal dilatation.  Kidneys  are unremarkable. No renal calculi or hydronephrosis.  No evidence of bowel obstruction. Prior appendectomy.  Atherosclerotic calcifications of the abdominal aorta and branch vessels.  No abdominopelvic ascites.  No suspicious abdominopelvic lymphadenopathy.  Prostate is unremarkable.  Bladder is within normal limits.  Degenerative changes of the visualized thoracolumbar spine.  IMPRESSION: 14.5 x 18.1 x 26.1 cm fluid collection/abscess within the anterior abdominal wall, as described above, incompletely visualized. The collection appears just anterior to prior ventral hernia mesh.  Given the size and proximity to the skin surface, incision and drainage is suggested.  These results were called by telephone at the time of interpretation on 08/14/2013 at 3:22 PM to Dr. Irine Seal , who verbally acknowledged these results.   Electronically Signed   By: Julian Hy M.D.   On: 08/14/2013 15:28   Dg Chest 2 View  08/01/2013   CLINICAL DATA:  Chest pain, shortness of breath.  EXAM: CHEST  2 VIEW  COMPARISON:  August 24, 2012.  FINDINGS: Stable cardiomegaly. Left-sided pacemaker is noted. No pleural effusion or pneumothorax is noted. Mild central pulmonary vascular congestion is noted which appears to be chronic. No acute pulmonary disease is noted.  IMPRESSION: No active cardiopulmonary disease.   Electronically Signed   By: Sabino Dick M.D.   On: 08/01/2013 17:06   Dg Hip Complete Right  08/11/2013   CLINICAL DATA:  Right posterior hip pain.  EXAM: RIGHT HIP - COMPLETE 2+ VIEW  COMPARISON:  None.  FINDINGS: There is no evidence  of hip fracture or dislocation. There is no evidence of arthropathy or other focal bone abnormality.  IMPRESSION: No acute osseous injury of the right hip.   Electronically Signed   By: Kathreen Devoid   On: 08/11/2013 15:16   Ct Angio Chest Pe W/cm &/or Wo Cm  08/01/2013   CLINICAL DATA:  Shortness of Breath,  EXAM: CT ANGIOGRAPHY CHEST WITH CONTRAST  TECHNIQUE: Multidetector  CT imaging of the chest was performed using the standard protocol during bolus administration of intravenous contrast. Multiplanar CT image reconstructions including MIPs were obtained to evaluate the vascular anatomy.  CONTRAST:  134mL OMNIPAQUE IOHEXOL 350 MG/ML SOLN  COMPARISON:  06/28/2012  FINDINGS: There is fairly good contrast opacification of the pulmonary artery branches with no convincing filling defects to suggest acute PE. There is good contrast opacification of the thoracic aorta with some scattered plaque, no evidence of dissection or stenosis. There is dilatation of the ascending aorta measuring up to 4.5 cm transverse diameter, stable by my measurement since prior study. Bovine brachiocephalic arterial origin anatomy without proximal stenosis. No pleural or pericardial effusion. No hilar or mediastinal adenopathy. Transvenous pacing leads are noted. Minimal linear scarring or subsegmental atelectasis in both lower lobes. Lungs otherwise clear. Visualized portions of upper abdomen unremarkable. Multiple bridging osteophytes throughout the mid and lower thoracic spine. Sternum intact.  Review of the MIP images confirms the above findings.  IMPRESSION: 1. 4.5 cm ascending aortic aneurysm without complicating features. 2. No evidence of acute pulmonary embolism or aortic dissection.   Electronically Signed   By: Arne Cleveland M.D.   On: 08/01/2013 19:55   Dg Chest Port 1 View  08/18/2013   CLINICAL DATA:  Confirm line placement  EXAM: PORTABLE CHEST - 1 VIEW  COMPARISON:  08/17/2013  FINDINGS: Two images were taken. The patient is in semi-upright position and is slightly rotated to the right. A right upper extremity PICC has been placed, the distal tip of which is in the mid superior vena cava. Left chest wall triple lead pacemaker appears stable. Cardiomegaly. There is diffuse pulmonary vascular congestion and similar appearance of pulmonary edema. No pneumothorax or pleural effusion is  appreciated.  IMPRESSION: 1. Right upper extremity PICC terminates in the mid superior vena cava. 2. Stable congestive heart failure pattern.   Electronically Signed   By: Curlene Dolphin M.D.   On: 08/18/2013 21:39   Dg Chest Port 1 View  08/17/2013   CLINICAL DATA:  Respiratory failure  EXAM: PORTABLE CHEST - 1 VIEW  COMPARISON:  08/15/2013.  FINDINGS: Persistent cardiomegaly with pulmonary vascular prominence and interstitial prominence suggesting congestive heart failure with interstitial edema noted. Interstitial pneumonitis cannot be excluded. Cardiac pacer. Lead tips in stable position. No pneumothorax. No acute osseous abnormality.  IMPRESSION: Stable chest with changes of congestive heart failure with pulmonary interstitial edema. Cardiac pacer in stable position.   Electronically Signed   By: Marcello Moores  Register   On: 08/17/2013 07:41   Dg Chest Portable 1 View  08/15/2013   CLINICAL DATA:  Sepsis, respiratory distress  EXAM: PORTABLE CHEST - 1 VIEW  COMPARISON:  Prior radiograph from 08/13/2013  FINDINGS: Left-sided pacemaker is unchanged. Cardiomegaly is stable as compared to the prior exam.  Lungs are normally inflated. There has been worsening of pulmonary vascular congestion and edema as compared to the prior examination. No definite pleural effusion. No focal infiltrates appreciated. No pneumothorax.  Osseous structures are unchanged.  IMPRESSION: Stable cardiomegaly with interval worsening of pulmonary vascular  congestion/ edema relative to 08/13/2013.   Electronically Signed   By: Jeannine Boga M.D.   On: 08/15/2013 01:16   Dg Chest Port 1 View  08/13/2013   CLINICAL DATA:  Leukocytosis.  EXAM: PORTABLE CHEST - 1 VIEW  COMPARISON:  08/01/2013  FINDINGS: No focal pulmonary consolidation is identified. There is stable mild pulmonary venous prominence without overt edema or visualized pleural fluid. The heart remains mildly enlarged. There is stable appearance of a biventricular pacemaker.   IMPRESSION: No acute findings. Stable cardiomegaly and pulmonary venous prominence without overt edema.   Electronically Signed   By: Aletta Edouard M.D.   On: 08/13/2013 15:44    Procedures:  2-D echo 08/12/2013  X-Debruler of the right hip 08/11/2013  CT abdomen and pelvis pending  Incision and drainage of abscess, Gore-Tex mesh removal, debridement of abdominal wall and peritoneum   Consultants:  Cardiology: Dr. Terrence Dupont 08/13/2013  General surgery: Dr. Johney Maine 08/14/2013  ID: Dr Megan Salon 08/15/13      Subjective: Patient denies abdominal pain. Denies fevers, chills, chest discomfort, shortness breath, nausea, vomiting and diarrhea. Patient has small bowel movement yesterday. Denies any headache or visual changes. Denies any dysuria.  Objective: Filed Vitals:   08/24/13 2015 08/25/13 0352 08/25/13 1025 08/25/13 1405  BP: 121/57 128/63 96/74 128/67  Pulse: 60 59 78 64  Temp: 98.6 F (37 C) 98.5 F (36.9 C) 98.4 F (36.9 C) 99.4 F (37.4 C)  TempSrc: Oral Oral Oral Oral  Resp: 22 16 17 16   Height:      Weight:  151.4 kg (333 lb 12.4 oz) 151.4 kg (333 lb 12.4 oz)   SpO2: 98% 98% 99% 98%    Intake/Output Summary (Last 24 hours) at 08/25/13 1648 Last data filed at 08/25/13 1500  Gross per 24 hour  Intake 1343.83 ml  Output   2285 ml  Net -941.17 ml   Weight change: 0.1 kg (3.5 oz) Exam:   General:  Pt is alert, follows commands appropriately, not in acute distress  HEENT: No icterus, No thrush,  [place]/AT  Cardiovascular: RRR, S1/S2, no rubs, no gallops  Respiratory: CTA bilaterally, no wheezing, no crackles, no rhonchi Abdomen: Soft/+BS, non tender, non distended, no guarding; large ventral wound with ostomy bag; no erythema--no necrosis, no crepitance    Extremities: trace LE edema, No lymphangitis, No petechiae, No rashes, no synovitis  Data Reviewed: Basic Metabolic Panel:  Recent Labs Lab 08/19/13 0435 08/20/13 0528 08/21/13 0400 08/22/13 0500  08/23/13 0548 08/24/13 0400 08/24/13 0730 08/25/13 0750  NA 138 140 137 137 139  --  133* 133*  K 3.8 3.5 3.5 3.5 3.5  --  3.6 3.5  CL 101 99 98 97 97  --  93* 93*  CO2 30 34* 33* 34* 34*  --  34* 31  GLUCOSE 103* 107* 107* 179* 138*  --  108* 93  BUN 14 12 12 15 20   --  24* 27*  CREATININE 1.08 1.06 1.09 1.19 1.13  --  1.28 1.29  CALCIUM 9.2 8.9 9.0 9.3 9.4  --  9.6 9.2  MG 1.5 1.4* 1.7 1.7 1.7 1.8  --   --   PHOS 3.9 3.7  --  4.2 4.5  --   --   --    Liver Function Tests:  Recent Labs Lab 08/19/13 0435 08/22/13 0500 08/23/13 0548  AST 36 19 17  ALT 21 12 12   ALKPHOS 37* 28* 29*  BILITOT 0.5 0.4 0.4  PROT 6.8  6.8 7.0  ALBUMIN 2.0* 2.1* 2.1*   No results found for this basename: LIPASE, AMYLASE,  in the last 168 hours No results found for this basename: AMMONIA,  in the last 168 hours CBC:  Recent Labs Lab 08/19/13 0435  08/21/13 0400 08/22/13 0500 08/23/13 0548 08/24/13 0400 08/25/13 0750  WBC 11.2*  < > 13.4* 12.1* 12.0* 12.0* 11.2*  NEUTROABS 7.9*  --   --   --  8.7*  --   --   HGB 12.1*  < > 12.3* 12.0* 12.1* 12.4* 12.0*  HCT 36.7*  < > 36.4* 36.2* 36.2* 37.1* 36.2*  MCV 97.3  < > 96.6 96.5 96.0 95.9 96.0  PLT 392  < > 380 365 340 291 326  < > = values in this interval not displayed. Cardiac Enzymes: No results found for this basename: CKTOTAL, CKMB, CKMBINDEX, TROPONINI,  in the last 168 hours BNP: No components found with this basename: POCBNP,  CBG:  Recent Labs Lab 08/24/13 2013 08/25/13 0024 08/25/13 0350 08/25/13 0833 08/25/13 1229  GLUCAP 161* 144* 104* 98 131*    No results found for this or any previous visit (from the past 240 hour(s)).   Scheduled Meds: . acetaminophen  1,000 mg Oral TID  . antiseptic oral rinse  15 mL Mouth Rinse q12n4p  . atorvastatin  40 mg Oral QODAY  . carvedilol  3.125 mg Oral BID WC  . chlorhexidine  15 mL Mouth Rinse BID  . furosemide  80 mg Oral BID  . insulin aspart  0-20 Units Subcutaneous Q4H  .  insulin glargine  65 Units Subcutaneous QHS  . lip balm  1 application Topical BID  . octreotide  100 mcg Subcutaneous TID  . OxyCODONE  30 mg Oral Q12H  . piperacillin-tazobactam (ZOSYN)  IV  3.375 g Intravenous Q8H  . psyllium  1 packet Oral Daily  . saccharomyces boulardii  250 mg Oral BID  . sodium chloride  3 mL Intravenous Q12H  . vancomycin  2,000 mg Intravenous Q24H   Continuous Infusions: . sodium chloride 50 mL/hr at 08/25/13 0057  . [name] KitchenTPN (CLINIMIX-E) Adult     And  . fat emulsion    . [name] KitchenTPN (CLINIMIX-E) Adult 125 mL/hr at 08/24/13 1718   And  . fat emulsion 250 mL (08/24/13 1719)     THOMPSON,DANIEL, MD  [facility] Pager [phone]  If 7PM-7AM, please contact night-coverage [email or web address] Password [email or web address] 08/25/2013, 4:48 PM   LOS: 14 days

## 2013-08-25 NOTE — Progress Notes (Addendum)
PARENTERAL NUTRITION CONSULT NOTE - FOLLOW UP  Pharmacy Consult for TNA Indication: Enterocutaneous fistula  No Known Allergies  Patient Measurements: Height: 6' (182.9 cm) Weight: 333 lb 12.4 oz (151.4 kg) IBW/kg (Calculated) : 77.6 Adjusted Body Weight: 102.6kg  Vital Signs: Temp: 98.4 F (36.9 C) (11/19 1025) Temp src: Oral (11/19 1025) BP: 96/74 mmHg (11/19 1025) Pulse Rate: 78 (11/19 1025) Intake/Output from previous day: 11/18 0701 - 11/19 0700 In: 1346.8 [P.O.:180; I.V.:1166.8] Out: 2485 [Urine:2485] Intake/Output from this shift: Total I/O In: 0  Out: 600 [Urine:600]  Labs:  Recent Labs  08/23/13 0548 08/24/13 0400 08/25/13 0750  WBC 12.0* 12.0* 11.2*  HGB 12.1* 12.4* 12.0*  HCT 36.2* 37.1* 36.2*  PLT 340 291 326     Recent Labs  08/23/13 0548 08/24/13 0400 08/24/13 0730 08/25/13 0750  NA 139  --  133* 133*  K 3.5  --  3.6 3.5  CL 97  --  93* 93*  CO2 34*  --  34* 31  GLUCOSE 138*  --  108* 93  BUN 20  --  24* 27*  CREATININE 1.13  --  1.28 1.29  CALCIUM 9.4  --  9.6 9.2  MG 1.7 1.8  --   --   PHOS 4.5  --   --   --   PROT 7.0  --   --   --   ALBUMIN 2.1*  --   --   --   AST 17  --   --   --   ALT 12  --   --   --   ALKPHOS 29*  --   --   --   BILITOT 0.4  --   --   --   PREALBUMIN 11.1*  --   --   --   TRIG 89  --   --   --    Estimated Creatinine Clearance: 92.2 ml/min (by C-G formula based on Cr of 1.29).    Recent Labs  08/25/13 0024 08/25/13 0350 08/25/13 0833  GLUCAP 144* 104* 98    Medications:  Scheduled:  . acetaminophen  1,000 mg Oral TID  . antiseptic oral rinse  15 mL Mouth Rinse q12n4p  . atorvastatin  40 mg Oral QODAY  . carvedilol  3.125 mg Oral BID WC  . chlorhexidine  15 mL Mouth Rinse BID  . furosemide  80 mg Oral BID  . insulin aspart  0-20 Units Subcutaneous Q4H  . insulin glargine  65 Units Subcutaneous QHS  . lip balm  1 application Topical BID  . octreotide  100 mcg Subcutaneous TID  . OxyCODONE   30 mg Oral Q12H  . piperacillin-tazobactam (ZOSYN)  IV  3.375 g Intravenous Q8H  . psyllium  1 packet Oral Daily  . saccharomyces boulardii  250 mg Oral BID  . sodium chloride  3 mL Intravenous Q12H  . vancomycin  2,000 mg Intravenous Q24H   Infusions:  . sodium chloride 50 mL/hr at 08/25/13 0057  . [name] KitchenTPN (CLINIMIX-E) Adult 125 mL/hr at 08/24/13 1718   And  . fat emulsion 250 mL (08/24/13 1719)    Insulin Requirements in the past 24 hours:  Lantus 65units sq qHS (increasing doses since admit) 14 units SSI required  - CBG q4h with resistant scale coverage 60 units regular insulin added to TNA 11/16  Nutritional Goals: Per RD 11/13 Kcal: 2400-2700 Protein: 160-180 grams Fluid: 3.3-3.5 L/day  Clinimix E5/15 at goal rate 125 ml/hr (3L/day) to provide  150g protein and 2675 kcal daily - Due to how much fluid can be placed in TNA bag must max out amount at 3L  Current Nutrition:  NPO Clinimix E5/15 at 125 ml/hr and 20% lipids at 86ml/hr   IVF: NS at 16ml/hr  Assessment: 62 yo male with large abdominal abscess with infected old Gore Tex mesh s/p I&D abscess, removal of mesh, debridement of abdominal wall and peritoneum. 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 TNA started 11/12 at 10pm.   Glucose: Hx DM on insulin PTA, CBG improved with addition of insulin to TNA 11/16 and increased Lantus doses  Electrolytes: K on low end of normal and stable - monitor fistula output closely (none recorded in past 24 hours but moderate output per surgery note) as this will deplete electrolytes. Na slightly low, Mag and Phos WNL  LFTs: wnl, albumin low (2.1)  TGs: wnl, 124 (11/13), 89 (11/17)  Prealbumin: low, 10.3 (11/13), 11.1 (11/17)  Monitor fluid status very closely since TNA at high goal rate.  - I/O documented but unsure of accuracy of intake recorded; weight is not increasing. Octreotide started 11/17 to control fistula output.  TPN Access: Triple lumen PICC placed 11/12 TPN  day#: 8  Plan:   Continue Clinimix E5/15 at goal rate of 133ml/hr.  20% fat emulsion at 18ml/hr.  Continue insulin 20 units/L in TNA bag and Lantus 65 units qhs  TNA to contain standard multivitamins and trace elements.  Continue IVF per MD - NS at 20ml/hr  TNA lab panels on Mondays & Thursdays.  F/u clinical progress, ability to start diet and wean TNA   Hershal Coria, PharmD, BCPS Pager: [phone] 08/25/2013 11:51 AM

## 2013-08-25 NOTE — Progress Notes (Signed)
Patient continues to refuse nocturnal CPAP/BiPAP. He is aware that RT is available for further assistance if he should become more compliant with therapy. Order changed to prn per RT protocol.

## 2013-08-25 NOTE — Progress Notes (Signed)
PT Cancellation Note  Patient Details Name: Kurt Cross MRN: [id number] DOB: 1951-04-24   Cancelled Treatment:    Reason Eval/Treat Not Completed: Other (comment) (declined PT due to reported abdominal pain.) Pt declined PT this afternoon due to pt reporting 12/10 abdominal pain and fatigue. Pt will check back as schedule permits.   Audie Clear 08/25/2013, 2:32 PM

## 2013-08-25 NOTE — Progress Notes (Signed)
Patient is now on TPN and has extensive wound care issues. CSW has tried to fax patient out but has had difficulty finding a snf with patient's insurance, tpn , and wall suction to assist with wound care. CSW to continue to follow for disposition.  Kurt Cross MSW, [place]

## 2013-08-25 NOTE — Progress Notes (Signed)
I have reviewed this note and agree with all findings. Kati Darcella Shiffman, PT, DPT Pager: [phone]   

## 2013-08-26 LAB — CBC
HCT: 35.9 % — ABNORMAL LOW (ref 39.0–52.0)
Hemoglobin: 12.1 g/dL — ABNORMAL LOW (ref 13.0–17.0)
MCH: 32.4 pg (ref 26.0–34.0)
MCV: 96.2 fL (ref 78.0–100.0)
RBC: 3.73 MIL/uL — ABNORMAL LOW (ref 4.22–5.81)
WBC: 10.1 10*3/uL (ref 4.0–10.5)

## 2013-08-26 LAB — COMPREHENSIVE METABOLIC PANEL
ALT: 10 U/L (ref 0–53)
AST: 14 U/L (ref 0–37)
Alkaline Phosphatase: 29 U/L — ABNORMAL LOW (ref 39–117)
BUN: 28 mg/dL — ABNORMAL HIGH (ref 6–23)
Calcium: 9.1 mg/dL (ref 8.4–10.5)
GFR calc Af Amer: 67 mL/min — ABNORMAL LOW (ref >90)
Glucose, Bld: 98 mg/dL (ref 70–99)
Potassium: 3.3 mEq/L — ABNORMAL LOW (ref 3.5–5.1)
Sodium: 137 mEq/L (ref 135–145)
Total Protein: 7.1 g/dL (ref 6.0–8.3)

## 2013-08-26 LAB — GLUCOSE, CAPILLARY
Glucose-Capillary: 100 mg/dL — ABNORMAL HIGH (ref 70–99)
Glucose-Capillary: 115 mg/dL — ABNORMAL HIGH (ref 70–99)
Glucose-Capillary: 131 mg/dL — ABNORMAL HIGH (ref 70–99)
Glucose-Capillary: 135 mg/dL — ABNORMAL HIGH (ref 70–99)
Glucose-Capillary: 169 mg/dL — ABNORMAL HIGH (ref 70–99)

## 2013-08-26 LAB — MAGNESIUM: Magnesium: 1.8 mg/dL (ref 1.5–2.5)

## 2013-08-26 MED ORDER — POTASSIUM CHLORIDE 10 MEQ/100ML IV SOLN
10.0000 meq | INTRAVENOUS | Status: AC
  Administered 2013-08-26 (×4): 10 meq via INTRAVENOUS
  Filled 2013-08-26 (×4): qty 100

## 2013-08-26 MED ORDER — FAT EMULSION 20 % IV EMUL
240.0000 mL | INTRAVENOUS | Status: AC
  Administered 2013-08-26: 18:00:00 240 mL via INTRAVENOUS
  Filled 2013-08-26: qty 250

## 2013-08-26 MED ORDER — OCTREOTIDE ACETATE 100 MCG/ML IJ SOLN
150.0000 ug | Freq: Three times a day (TID) | INTRAMUSCULAR | Status: DC
  Administered 2013-08-26 – 2013-09-09 (×44): 150 ug via SUBCUTANEOUS
  Administered 2013-09-09: 11:00:00 100 ug via SUBCUTANEOUS
  Administered 2013-09-10 – 2013-09-24 (×41): 150 ug via SUBCUTANEOUS
  Filled 2013-08-26 (×102): qty 1.5

## 2013-08-26 MED ORDER — TRACE MINERALS CR-CU-F-FE-I-MN-MO-SE-ZN IV SOLN
INTRAVENOUS | Status: AC
  Administered 2013-08-26: 18:00:00 via INTRAVENOUS
  Filled 2013-08-26: qty 3000

## 2013-08-26 NOTE — Progress Notes (Signed)
Expanded snf search.  Otto Felkins C. [place] MSW, [place]

## 2013-08-26 NOTE — Progress Notes (Signed)
PT Cancellation Note  ___Treatment cancelled today due to medical issues with patient which prohibited therapy  ___ Treatment cancelled today due to patient receiving procedure or test   ___ Treatment cancelled today due to patient's refusal to participate   _X_ Treatment cancelled today due to pt request, "they just changed my dressing"  Rica Koyanagi  PTA [facility]  Acute  Rehab Pager      [phone]

## 2013-08-26 NOTE — Progress Notes (Signed)
12 Days Post-Op  Subjective: Suction tube just fell out.  No pain.  Objective: Vital signs in last 24 hours: Temp:  [97.7 F (36.5 C)-99.4 F (37.4 C)] 97.9 F (36.6 C) (11/20 0430) Pulse Rate:  [59-78] 60 (11/20 0430) Resp:  [16-18] 18 (11/20 0430) BP: (96-128)/(64-74) 103/66 mmHg (11/20 0430) SpO2:  [96 %-100 %] 100 % (11/20 0430) Weight:  [333 lb 12.4 oz (151.4 kg)] 333 lb 12.4 oz (151.4 kg) (11/19 1025) Last BM Date: 08/24/13  Intake/Output from previous day: 11/19 0701 - 11/20 0700 In: 9972.4 [P.O.:180; I.V.:200; IV Piggyback:1300; TPN:8292.4] Out: 3350 [Urine:2900; Drains:450] Intake/Output this shift:    PE: General- In NAD Abdomen-soft, open wound with succus drainage Lab Results:   Recent Labs  08/25/13 0750 08/26/13 0517  WBC 11.2* 10.1  HGB 12.0* 12.1*  HCT 36.2* 35.9*  PLT 326 300   BMET  Recent Labs  08/25/13 0750 08/26/13 0517  NA 133* 137  K 3.5 3.3*  CL 93* 95*  CO2 31 32  GLUCOSE 93 98  BUN 27* 28*  CREATININE 1.29 1.31  CALCIUM 9.2 9.1   PT/INR No results found for this basename: LABPROT, INR,  in the last 72 hours Comprehensive Metabolic Panel:    Component Value Date/Time   NA 137 08/26/2013 0517   K 3.3* 08/26/2013 0517   CL 95* 08/26/2013 0517   CO2 32 08/26/2013 0517   BUN 28* 08/26/2013 0517   CREATININE 1.31 08/26/2013 0517   GLUCOSE 98 08/26/2013 0517   CALCIUM 9.1 08/26/2013 0517   AST 14 08/26/2013 0517   ALT 10 08/26/2013 0517   ALKPHOS 29* 08/26/2013 0517   BILITOT 0.5 08/26/2013 0517   PROT 7.1 08/26/2013 0517   ALBUMIN 2.1* 08/26/2013 0517     Studies/Results: No results found.  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 Diabetes mellitus, insulin dependent (IDDM), uncontrolled  HYPERLIPIDEMIA  Obesity, Class III, BMI 40-49.9 (morbid obesity)  OBSTRUCTIVE SLEEP APNEA  Infected prosthetic mesh of abdominal wall with GIANT abscess s/p removal 08/14/2013 Acute renal insufficiency from fistula/wound loss-improved    LOS: 15 days   Plan: Increase Sandostatin dose.  [place] nurse to replace tube.   Naly Schwanz J 08/26/2013

## 2013-08-26 NOTE — Progress Notes (Signed)
OT Cancellation Note  Patient Details Name: Kurt Cross MRN: [id number] DOB: [birth year]   Cancelled Treatment:    Reason Eval/Treat Not Completed: Pain limiting ability to participate. Pt also having complications with abdominal wound. Will recheck pt later today or next day.   Betsy Pries 08/26/2013, 10:24 AM

## 2013-08-26 NOTE — Progress Notes (Signed)
Per CMT, patient has had a few pauses along with frequent PVCs this evening. Pauses range from 5-17 seconds. Patient is asymptomatic and BP is stable. Called [facility] hospitalist on call who ordered 12-lead EKG. Will obtain EKG and continue to monitor patient. Marita Snellen

## 2013-08-26 NOTE — Progress Notes (Signed)
Follow-up:   Notified by RN that central telemetry has notified that pt having 5.5, 10  and 26 sec pauses in pt w/ known pacer/ICD. EKG obtained and revealed V-paced rhythm w/ rate of 60. Rhythm strips reviewed and during "pauses" there are QRS complexes and no pacer spikes. Pt noted sleeping and denied any symptoms when seen at bedside. VSS. Discussed pt w/ Dr Leeanne Mannan who also reviewed EKG and rhythm strips. Unclear if pacer not sensing or failing to capture. Given pt quite stable will defer possible cardiology consult for pacer interrogation to rounding MD in AM. Will continue to monitor closely on telemetry.   Jeryl Columbia, NP-C [facility] Pager [phone]

## 2013-08-26 NOTE — Progress Notes (Signed)
Per CMT, patient continues to have long pauses up to 23 seconds. Upon reviewing strips, there does seem to be some ventricular activity during these "pauses," so unsure if true pause or not. [facility] hospitalist on call, Schorr, up to see patient and to review strips and EKG. Strips also shown to ED MDs for review by Schorr who agree that there does appear to be ventricular activity during these "pauses." Because patient is stable and asymptomatic, MD will see tomorrow to evaluate this. No new orders  received at this time. Will continue to monitor patient.  Marita Snellen

## 2013-08-26 NOTE — Consult Note (Signed)
WOC wound follow up Wound type:Large open abdominal wound with stomatized fistula in the wound bed at 5 o'clock.  New area of concern at 7 o'clock.   Drainage (amount, consistency, odor) green/brown with mucus. Periwound: Intact.  Area of induration and erythema from 6-8 o'clock presenting with orange-peel skin. Dressing procedure/placement/frequency: Large Eakin pouch placed after gently cleansing surrounding skin and wound and following tissue inspection to determine if there was any residual silicone dressing in wound.  PA Will Creig Hines and I explored the wound carefully using a local light source and see only wound tissue, the previously identified stomatized fistula at 5 o'clock and a new area of concern for fistulization at 7 o'clock. Three (3) pieces of Mepitel silicone dressing placed into wound (4x8 inch) and a #20 red rubber catheter is fed through an opening at the top of the pouch and rests upon the silicone in the 5 o'clock position.  Catheter is attached to low constant suction (72-75 mmHg) and pouch is decompressed. Patient and wife are pleased with report and feel that patient is improving.  A Bariatric bed with trapeze is being provided at the time of this writing.  InterDry Ag+ is requested for intertriginous dermatitis beneath the abdominal panus. The [facility] [place] team will continue to follow and assist with fistual management when visiting.  Staff RNs have instructions to provide care in the event that a [place] nurse is not in the facility at the time of leakage. Thanks, Maudie Flakes, MSN, RN, [place], [place], [place] [phone])

## 2013-08-26 NOTE — Progress Notes (Signed)
Pictures reviewed.  Area at 7:00 highly suspicious for another fistula.

## 2013-08-26 NOTE — Progress Notes (Signed)
PARENTERAL NUTRITION CONSULT NOTE - FOLLOW UP  Pharmacy Consult for TNA Indication: Enterocutaneous fistula  No Known Allergies  Patient Measurements: Height: 6' (182.9 cm) Weight: 333 lb 12.4 oz (151.4 kg) IBW/kg (Calculated) : 77.6 Adjusted Body Weight: 102.6kg  Vital Signs: Temp: 98.2 F (36.8 C) (11/20 1002) Temp src: Oral (11/20 1002) BP: 122/62 mmHg (11/20 1002) Pulse Rate: 60 (11/20 1002) Intake/Output from previous day: 11/19 0701 - 11/20 0700 In: 9972.4 [P.O.:180; I.V.:200; IV Piggyback:1300; TPN:8292.4] Out: 3450 [Urine:2900; Drains:550] Intake/Output from this shift: Total I/O In: -  Out: 200 [Urine:200]  Labs:  Recent Labs  08/24/13 0400 08/25/13 0750 08/26/13 0517  WBC 12.0* 11.2* 10.1  HGB 12.4* 12.0* 12.1*  HCT 37.1* 36.2* 35.9*  PLT 291 326 300     Recent Labs  08/24/13 0400 08/24/13 0730 08/25/13 0750 08/26/13 0517  NA  --  133* 133* 137  K  --  3.6 3.5 3.3*  CL  --  93* 93* 95*  CO2  --  34* 31 32  GLUCOSE  --  108* 93 98  BUN  --  24* 27* 28*  CREATININE  --  1.28 1.29 1.31  CALCIUM  --  9.6 9.2 9.1  MG 1.8  --   --  1.8  PHOS  --   --   --  5.0*  PROT  --   --   --  7.1  ALBUMIN  --   --   --  2.1*  AST  --   --   --  14  ALT  --   --   --  10  ALKPHOS  --   --   --  29*  BILITOT  --   --   --  0.5   Estimated Creatinine Clearance: 90.8 ml/min (by C-G formula based on Cr of 1.31).    Recent Labs  08/25/13 2030 08/26/13 0039 08/26/13 0428  GLUCAP 136* 131* 100*    Medications:  Scheduled:  . acetaminophen  1,000 mg Oral TID  . antiseptic oral rinse  15 mL Mouth Rinse q12n4p  . atorvastatin  40 mg Oral QODAY  . carvedilol  3.125 mg Oral BID WC  . chlorhexidine  15 mL Mouth Rinse BID  . furosemide  80 mg Oral BID  . insulin aspart  0-20 Units Subcutaneous Q4H  . insulin glargine  65 Units Subcutaneous QHS  . lip balm  1 application Topical BID  . octreotide  150 mcg Subcutaneous TID  . OxyCODONE  30 mg Oral Q12H   . piperacillin-tazobactam (ZOSYN)  IV  3.375 g Intravenous Q8H  . potassium chloride  10 mEq Intravenous Q1 Hr x 4  . psyllium  1 packet Oral Daily  . saccharomyces boulardii  250 mg Oral BID  . sodium chloride  3 mL Intravenous Q12H  . vancomycin  2,000 mg Intravenous Q24H   Infusions:  . sodium chloride 50 mL/hr at 08/26/13 0031  . [name] KitchenTPN (CLINIMIX-E) Adult 125 mL/hr at 08/25/13 1716   And  . fat emulsion 240 mL (08/25/13 1717)  . TPN (CLINIMIX) Adult without lytes     And  . fat emulsion      Insulin Requirements in the past 24 hours:  Lantus 65units sq qHS (increasing doses since admit) 9 units SSI required  - CBG q4h with resistant scale coverage 60 units regular insulin added to TNA 11/16  Nutritional Goals: Per RD 11/13 Kcal: 2400-2700 Protein: 160-180 grams Fluid: 3.3-3.5 L/day  Clinimix E5/15 at goal rate 125 ml/hr (3L/day) to provide 150g protein and 2675 kcal daily - Due to how much fluid can be placed in TNA bag must max out amount at 3L  Current Nutrition:  NPO Clinimix E5/15 at 125 ml/hr and 20% lipids at 26ml/hr   IVF: NS at 3ml/hr  Assessment: 62 yo male with large abdominal abscess with infected old [place] mesh s/p I&D abscess, removal of mesh, debridement of abdominal wall and peritoneum. 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 TNA started 11/12 at 10pm.   Glucose: Hx DM on insulin PTA, CBG improved with addition of insulin to TNA 11/16 and increased Lantus doses  Electrolytes: Phos high now at 5.0.  Will remove electrolytes from TNA since Phos increasing.  K 3.3 today and is currently being replaced - monitor fistula output closely (none recorded in past 24 hours but moderate output per surgery note) as this will deplete electrolytes. Na wnl now, Mag WNL.  LFTs: wnl, albumin remains low (2.1)  TGs: wnl, 124 (11/13), 89 (11/17)  Prealbumin: low, 10.3 (11/13), 11.1 (11/17)  Monitor fluid status very closely since TNA at high goal rate.   Following I/O; weight is not increasing. Octreotide started 11/17 to control fistula output and increased today.  Drain output recorded as 550 mL out yesterday.  TPN Access: Triple lumen PICC placed 11/12 TPN day#: 9  Plan:   Switch TNA to electrolyte-free formulation while Phos elevated and increasing.  At 1800, start Clinimix 5/15 at goal rate of 160ml/hr.  Continue 20% fat emulsion at 42ml/hr.  Continue insulin 20 units/L in TNA bag and Lantus 65 units qhs  TNA to contain standard multivitamins and trace elements.  Continue IVF per MD - NS at 42ml/hr  TNA lab panels on Mondays & Thursdays.  BMET and Phos ordered for AM.  F/u clinical progress, ability to start diet and wean TNA   Hershal Coria, PharmD, BCPS Pager: [phone] 08/26/2013 11:39 AM

## 2013-08-26 NOTE — Progress Notes (Signed)
Notified Dr. Grandville Silos of patient HR this AM and during the night, stated to hold AM coreg. Will continue to monitor.

## 2013-08-26 NOTE — Progress Notes (Signed)
[facility] PROGRESS NOTE  Kurt Cross [id number] DOB: 03/01/1951 DOA: 08/11/2013 PCP: [name], MD  Brief History 62 y.o. male with a remote history of bowel perforation requiring exploratory laparotomy and temporary colostomy. He then developed a ventral hernia and underwent mesh repair decades ago. He was recently hospitalized and treated for possible pneumonia. He had a fall at home recently and injured his right lower leg leading to readmission. He was noted to be febrile with some abdominal wall swelling. CT scan revealed a large abdominal wall abscess. 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  He was placed on bowel rest and started on TPN. Early in the admission, the patient was fluid overloaded and received intravenous furosemide. He has been since transitioned back to his home dose of oral furosemide and remains clinically stable from CHF standpoint.   Assessment/Plan: #1 infected prosthetic Gore-Tex mesh with a giant abdominal abscess status post IandD and Gore-Tex mesh removal 08/14/2013  Patient is status post incision and drainage of abscess, Gore-Tex mesh removal, debridement of abdominal wall and peritoneum 08/14/2013.  -empiric IV Vancomycin and Zosyn.  -Blood cultures--neg  -now with EC fistula-->started on sandostatin by surgery 08/23/13 and dose increased today.  -wound care and TNA per surgery  -abx per ID  -increased long acting oxycontin 20mg  q 12hrs for pain control  -Patient was asking for OxyIR only 2-3 times per day--Dr Tat educated the patient that he may ask for up to 6 times per day if pain remains uncontrolled  -CT abdomen if WBC continues to rise  #2 sepsis  -secondary to problem #1.  -Wound cultures prelim with GPC in pairs and clusters-->Coag Neg Staph  -Remains hemodynamically stable  #3 hypotension  -resolved  -secondary to problem #2.  -Increased  carvedilol to home dose  #4 atrial fibrillation/atrial flutter  -secondary to problem #1.  -currently rate controlled. Initial EKG showed flutter waves.  -Cardiac enzymes have been negative x3. 2-D echo with EF of 50-55%. -CHADSVASc score is 4 (not ASVD plaque on abd aorta).  -Continue Coreg for rate control.  -Eliquis was discontinued initially as patient went for emergent surgery secondary to problem #1.  -d/c [place] lovenox as pt now has blood from ventral wound -scheduled for TEE cardioversion however in light of problem #1 this was cancelled - F/u with cardiology 2-3 weeks post discharge.  #5 chronic kidney disease stage III  -Creatinine is trending back down. ACE inhibitor had been discontinued.  -Plan to restart ACE inhibitor if renal function remains stable  -Serum creatinine gradually increasing with decreased sodium--suspect increased insensible loss from the patient's large ventral wound -Normal saline started today by CCS--monitor fluid status closely with a history of CHF--may need to decrease furosemide  #6 leukocytosis/fever  -secondary to problem #1.  -resolved--Currently afebrile.  -WBC up a little but remains stable  -Chest x-Mika is negative for infiltrate. Patient with no respiratory symptoms.  -CT abdomen if WBC continues to rise  #7 chronic combined systolic and diastolic CHF  -Initial CXR with volume overload. given 4 doses of lasix 40mg  IV -Continue home dose furosemide 80 mg po twice a day  -decreased  carvedilol to 3.125mg  bid as BP was soft. Monitor closely with HR. -08/12/2013 echocardiogram shows EF 50-55%--EF improved compared to one year ago (35-40%)  #8 Uncontrolled Pain -Increased long acting OxyContin to 30 mg twice a day -Increased  OxyIR to 15 mg every 4 hours prn breakthrough pain #9 hyperlipidemia  Continue statin.  #10 4.5 cm ascending aortic aneurysm per CT scan of 08/01/2013  Stable. Asymptomatic. Continue risk factor modification with good blood  pressure control. Will need outpatient followup with vascular surgery as outpatient.  #11 type 2 diabetes  Hemoglobin A1c was 9.5 on 08/02/2013.Continue Lantus 65 units daily. -Sliding scale insulin.  -CBGs slowly creeping up with TNA  #12. Hypomagnesemia  -Repleted  #13 ?? Pauses Events overnight noted. Patient asymptomatic. If HR decreases will decrease coreg. Cardiology notified and ICD to be interrogated.   Family Communication: Updated patient and wife at beside  Disposition Plan: Home when cleared by surgery     Antibiotics Vancomycin 08/15/13>>>  Zosyn 08/14/13>>>               Procedures/Studies: Ct Abdomen Pelvis Wo Contrast  08/14/2013   CLINICAL DATA:  Fever, leukocytosis, prior hernia repairs and appendectomy  EXAM: CT ABDOMEN AND PELVIS WITHOUT CONTRAST  TECHNIQUE: Multidetector CT imaging of the abdomen and pelvis was performed following the standard protocol without intravenous contrast.  COMPARISON:  None.  FINDINGS: Note: Given body habitus, the entire anterior abdominal wall (including a portion of the abnormality) could not be included in the field of view despite two separate attempts.  Prior ventral hernia mesh repair. Just anterior to the mesh, within the anterior abdominal wall, is an abdominal wall fluid collection/abscess measuring approximately 14.5 x 18.1 x 26.1 cm (incompletely visualized). Along the superior aspect of the fluid collection, gas extends to immediately beneath the skin surface (series 10/image 27). Bowel loops are displaced posteriorly by the large collection fluid without definite communication.  A small hernia containing fat and a loop of nondilated colon is present inferiorly (sagittal image 88), to the left of midline (series 10/image 81 and 86).  Minimal left basilar atelectasis.  Cardiomegaly. Coronary atherosclerosis. ICD leads, incompletely visualized.  Moderate hepatic steatosis.  Unenhanced spleen and pancreas are unremarkable.   Low-density nodular thickening of the bilateral adrenal glands, likely reflecting adrenal adenomas.  Gallbladder is notable for layering gallstone. No intrahepatic or extrahepatic ductal dilatation.  Kidneys are unremarkable. No renal calculi or hydronephrosis.  No evidence of bowel obstruction. Prior appendectomy.  Atherosclerotic calcifications of the abdominal aorta and branch vessels.  No abdominopelvic ascites.  No suspicious abdominopelvic lymphadenopathy.  Prostate is unremarkable.  Bladder is within normal limits.  Degenerative changes of the visualized thoracolumbar spine.  IMPRESSION: 14.5 x 18.1 x 26.1 cm fluid collection/abscess within the anterior abdominal wall, as described above, incompletely visualized. The collection appears just anterior to prior ventral hernia mesh.  Given the size and proximity to the skin surface, incision and drainage is suggested.  These results were called by telephone at the time of interpretation on 08/14/2013 at 3:22 PM to Dr. Irine Seal , who verbally acknowledged these results.   Electronically Signed   By: Julian Hy M.D.   On: 08/14/2013 15:28   Dg Chest 2 View  08/01/2013   CLINICAL DATA:  Chest pain, shortness of breath.  EXAM: CHEST  2 VIEW  COMPARISON:  August 24, 2012.  FINDINGS: Stable cardiomegaly. Left-sided pacemaker is noted. No pleural effusion or pneumothorax is noted. Mild central pulmonary vascular congestion is noted which appears to be chronic. No acute pulmonary disease is noted.  IMPRESSION: No active cardiopulmonary disease.   Electronically Signed   By: Sabino Dick M.D.   On: 08/01/2013 17:06   Dg Hip Complete  Right  08/11/2013   CLINICAL DATA:  Right posterior hip pain.  EXAM: RIGHT HIP - COMPLETE 2+ VIEW  COMPARISON:  None.  FINDINGS: There is no evidence of hip fracture or dislocation. There is no evidence of arthropathy or other focal bone abnormality.  IMPRESSION: No acute osseous injury of the right hip.   Electronically  Signed   By: Kathreen Devoid   On: 08/11/2013 15:16   Ct Angio Chest Pe W/cm &/or Wo Cm  08/01/2013   CLINICAL DATA:  Shortness of Breath,  EXAM: CT ANGIOGRAPHY CHEST WITH CONTRAST  TECHNIQUE: Multidetector CT imaging of the chest was performed using the standard protocol during bolus administration of intravenous contrast. Multiplanar CT image reconstructions including MIPs were obtained to evaluate the vascular anatomy.  CONTRAST:  164mL OMNIPAQUE IOHEXOL 350 MG/ML SOLN  COMPARISON:  06/28/2012  FINDINGS: There is fairly good contrast opacification of the pulmonary artery branches with no convincing filling defects to suggest acute PE. There is good contrast opacification of the thoracic aorta with some scattered plaque, no evidence of dissection or stenosis. There is dilatation of the ascending aorta measuring up to 4.5 cm transverse diameter, stable by my measurement since prior study. Bovine brachiocephalic arterial origin anatomy without proximal stenosis. No pleural or pericardial effusion. No hilar or mediastinal adenopathy. Transvenous pacing leads are noted. Minimal linear scarring or subsegmental atelectasis in both lower lobes. Lungs otherwise clear. Visualized portions of upper abdomen unremarkable. Multiple bridging osteophytes throughout the mid and lower thoracic spine. Sternum intact.  Review of the MIP images confirms the above findings.  IMPRESSION: 1. 4.5 cm ascending aortic aneurysm without complicating features. 2. No evidence of acute pulmonary embolism or aortic dissection.   Electronically Signed   By: Arne Cleveland M.D.   On: 08/01/2013 19:55   Dg Chest Port 1 View  08/18/2013   CLINICAL DATA:  Confirm line placement  EXAM: PORTABLE CHEST - 1 VIEW  COMPARISON:  08/17/2013  FINDINGS: Two images were taken. The patient is in semi-upright position and is slightly rotated to the right. A right upper extremity PICC has been placed, the distal tip of which is in the mid superior vena cava.  Left chest wall triple lead pacemaker appears stable. Cardiomegaly. There is diffuse pulmonary vascular congestion and similar appearance of pulmonary edema. No pneumothorax or pleural effusion is appreciated.  IMPRESSION: 1. Right upper extremity PICC terminates in the mid superior vena cava. 2. Stable congestive heart failure pattern.   Electronically Signed   By: Curlene Dolphin M.D.   On: 08/18/2013 21:39   Dg Chest Port 1 View  08/17/2013   CLINICAL DATA:  Respiratory failure  EXAM: PORTABLE CHEST - 1 VIEW  COMPARISON:  08/15/2013.  FINDINGS: Persistent cardiomegaly with pulmonary vascular prominence and interstitial prominence suggesting congestive heart failure with interstitial edema noted. Interstitial pneumonitis cannot be excluded. Cardiac pacer. Lead tips in stable position. No pneumothorax. No acute osseous abnormality.  IMPRESSION: Stable chest with changes of congestive heart failure with pulmonary interstitial edema. Cardiac pacer in stable position.   Electronically Signed   By: Marcello Moores  Register   On: 08/17/2013 07:41   Dg Chest Portable 1 View  08/15/2013   CLINICAL DATA:  Sepsis, respiratory distress  EXAM: PORTABLE CHEST - 1 VIEW  COMPARISON:  Prior radiograph from 08/13/2013  FINDINGS: Left-sided pacemaker is unchanged. Cardiomegaly is stable as compared to the prior exam.  Lungs are normally inflated. There has been worsening of pulmonary vascular congestion and edema as  compared to the prior examination. No definite pleural effusion. No focal infiltrates appreciated. No pneumothorax.  Osseous structures are unchanged.  IMPRESSION: Stable cardiomegaly with interval worsening of pulmonary vascular congestion/ edema relative to 08/13/2013.   Electronically Signed   By: Jeannine Boga M.D.   On: 08/15/2013 01:16   Dg Chest Port 1 View  08/13/2013   CLINICAL DATA:  Leukocytosis.  EXAM: PORTABLE CHEST - 1 VIEW  COMPARISON:  08/01/2013  FINDINGS: No focal pulmonary consolidation is  identified. There is stable mild pulmonary venous prominence without overt edema or visualized pleural fluid. The heart remains mildly enlarged. There is stable appearance of a biventricular pacemaker.  IMPRESSION: No acute findings. Stable cardiomegaly and pulmonary venous prominence without overt edema.   Electronically Signed   By: Aletta Edouard M.D.   On: 08/13/2013 15:44    Procedures:  2-D echo 08/12/2013  X-Benedicto of the right hip 08/11/2013  CT abdomen and pelvis pending  Incision and drainage of abscess, Gore-Tex mesh removal, debridement of abdominal wall and peritoneum   Consultants:  Cardiology: Dr. Terrence Dupont 08/13/2013  General surgery: Dr. Johney Maine 08/14/2013  ID: Dr Megan Salon 08/15/13      Subjective: Patient denies abdominal pain. Denies fevers, chills, chest discomfort, shortness breath, nausea, vomiting and diarrhea. Patient has small bowel movement yesterday. Denies any headache or visual changes. Denies any dysuria. Events overnight noted with ?? Pauses.  Objective: Filed Vitals:   08/25/13 2333 08/26/13 0430 08/26/13 0800 08/26/13 1002  BP: 119/69 103/66 132/55 122/62  Pulse: 59 60 59 60  Temp:  97.9 F (36.6 C)  98.2 F (36.8 C)  TempSrc:  Oral  Oral  Resp:  18  18  Height:      Weight:      SpO2: 100% 100%  100%    Intake/Output Summary (Last 24 hours) at 08/26/13 1327 Last data filed at 08/26/13 1031  Gross per 24 hour  Intake 9792.42 ml  Output   2400 ml  Net 7392.42 ml   Weight change: 0 kg (0 lb) Exam:   General:  Pt is alert, follows commands appropriately, not in acute distress  HEENT: No icterus, No thrush,  [place]/AT  Cardiovascular: RRR, S1/S2, no rubs, no gallops  Respiratory: CTA bilaterally, no wheezing, no crackles, no rhonchi Abdomen: Soft/+BS, non tender, non distended, no guarding; large ventral wound with ostomy bag; no erythema--no necrosis, no crepitance    Extremities: trace LE edema, No lymphangitis, No petechiae, No rashes,  no synovitis  Data Reviewed: Basic Metabolic Panel:  Recent Labs Lab 08/20/13 0528 08/21/13 0400 08/22/13 0500 08/23/13 0548 08/24/13 0400 08/24/13 0730 08/25/13 0750 08/26/13 0517  NA 140 137 137 139  --  133* 133* 137  K 3.5 3.5 3.5 3.5  --  3.6 3.5 3.3*  CL 99 98 97 97  --  93* 93* 95*  CO2 34* 33* 34* 34*  --  34* 31 32  GLUCOSE 107* 107* 179* 138*  --  108* 93 98  BUN 12 12 15 20   --  24* 27* 28*  CREATININE 1.06 1.09 1.19 1.13  --  1.28 1.29 1.31  CALCIUM 8.9 9.0 9.3 9.4  --  9.6 9.2 9.1  MG 1.4* 1.7 1.7 1.7 1.8  --   --  1.8  PHOS 3.7  --  4.2 4.5  --   --   --  5.0*   Liver Function Tests:  Recent Labs Lab 08/22/13 0500 08/23/13 0548 08/26/13 0517  AST 19  17 14  ALT 12 12 10   ALKPHOS 28* 29* 29*  BILITOT 0.4 0.4 0.5  PROT 6.8 7.0 7.1  ALBUMIN 2.1* 2.1* 2.1*   No results found for this basename: LIPASE, AMYLASE,  in the last 168 hours No results found for this basename: AMMONIA,  in the last 168 hours CBC:  Recent Labs Lab 08/22/13 0500 08/23/13 0548 08/24/13 0400 08/25/13 0750 08/26/13 0517  WBC 12.1* 12.0* 12.0* 11.2* 10.1  NEUTROABS  --  8.7*  --   --   --   HGB 12.0* 12.1* 12.4* 12.0* 12.1*  HCT 36.2* 36.2* 37.1* 36.2* 35.9*  MCV 96.5 96.0 95.9 96.0 96.2  PLT 365 340 291 326 300   Cardiac Enzymes: No results found for this basename: CKTOTAL, CKMB, CKMBINDEX, TROPONINI,  in the last 168 hours BNP: No components found with this basename: POCBNP,  CBG:  Recent Labs Lab 08/25/13 1724 08/25/13 2030 08/26/13 0039 08/26/13 0428 08/26/13 1214  GLUCAP 122* 136* 131* 100* 169*    No results found for this or any previous visit (from the past 240 hour(s)).   Scheduled Meds: . acetaminophen  1,000 mg Oral TID  . antiseptic oral rinse  15 mL Mouth Rinse q12n4p  . atorvastatin  40 mg Oral QODAY  . carvedilol  3.125 mg Oral BID WC  . chlorhexidine  15 mL Mouth Rinse BID  . furosemide  80 mg Oral BID  . insulin aspart  0-20 Units  Subcutaneous Q4H  . insulin glargine  65 Units Subcutaneous QHS  . lip balm  1 application Topical BID  . octreotide  150 mcg Subcutaneous TID  . OxyCODONE  30 mg Oral Q12H  . piperacillin-tazobactam (ZOSYN)  IV  3.375 g Intravenous Q8H  . potassium chloride  10 mEq Intravenous Q1 Hr x 4  . psyllium  1 packet Oral Daily  . saccharomyces boulardii  250 mg Oral BID  . sodium chloride  3 mL Intravenous Q12H  . vancomycin  2,000 mg Intravenous Q24H   Continuous Infusions: . sodium chloride 50 mL/hr at 08/26/13 0031  . [name] KitchenTPN (CLINIMIX-E) Adult 125 mL/hr at 08/25/13 1716   And  . fat emulsion 240 mL (08/25/13 1717)  . TPN (CLINIMIX) Adult without lytes     And  . fat emulsion       THOMPSON,DANIEL, MD  [facility] Pager [phone]  If 7PM-7AM, please contact night-coverage [email or web address] Password [email or web address] 08/26/2013, 1:27 PM   LOS: 15 days

## 2013-08-26 NOTE — Progress Notes (Signed)
NUTRITION FOLLOW UP  Intervention:   Continue TPN per Pharmacy Diet advancement per MD discretion RD to continue monitor  Nutrition Dx:   Inadequate oral intake related to EC fistula as evidenced by NPO status; ongoing  Goal:   Pt to meet >/= 90% of their estimated nutrition needs; being met  Monitor:   TPN rate; clinamix E 5/15 @ goal rate of 125 ml/hr with lipids @ 10 ml/hr Weight; 23 lbs weight loss since admission, 9 lb weight loss in the past week Labs; Blood glucose ranging 93-179 mg/dL, low potassium, low albumin, low GFR, high phosphorus; Triglycerides WNL (11/17) Diet advancement; none  Assessment:   62 y.o. male with known history of chronic systolic heart failure status post defibrillator placement last EF measured was in 2013 was 35-40%, on home oxygen, OSA noncompliant with CPAP, diabetes mellitus, hypertension and hyperlipidemia. CT scan revealed a large abdominal wall abscess.  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 TNA started 11/12 at 10pm.  Per MD note moderate output continues from wound. Clinimix 5/15 is running at goal rate of 163ml/hr with 20% lipids at 54ml/hr; provides 150g protein and 2675 kcal daily. This meets 100% of estimated energy needs and 94% of estimated protein needs. Pt's weight has decreased 9 lbs in the past week and 23 lbs since admission. RD will continue to monitor weight trends and re-estimate energy needs as needed.   Height: Ht Readings from Last 1 Encounters:  08/14/13 6' (1.829 m)    Weight Status:   Wt Readings from Last 1 Encounters:  08/25/13 333 lb 12.4 oz (151.4 kg)    Re-estimated needs:  Kcal: 2400-2700 Protein: 160-180 grams Fluid: 3.3-3.5 L/day  Skin: non-pitting RLE and LLE edema; abdominal incisions/wounds with closed system drains  Diet Order: NPO   Intake/Output Summary (Last 24 hours) at 08/26/13 1301 Last data filed at 08/26/13 1031  Gross per 24 hour  Intake 9792.42 ml  Output   2400 ml  Net  7392.42 ml    Last BM: 11/18   Labs:   Recent Labs Lab 08/22/13 0500 08/23/13 0548 08/24/13 0400 08/24/13 0730 08/25/13 0750 08/26/13 0517  NA 137 139  --  133* 133* 137  K 3.5 3.5  --  3.6 3.5 3.3*  CL 97 97  --  93* 93* 95*  CO2 34* 34*  --  34* 31 32  BUN 15 20  --  24* 27* 28*  CREATININE 1.19 1.13  --  1.28 1.29 1.31  CALCIUM 9.3 9.4  --  9.6 9.2 9.1  MG 1.7 1.7 1.8  --   --  1.8  PHOS 4.2 4.5  --   --   --  5.0*  GLUCOSE 179* 138*  --  108* 93 98    CBG (last 3)   Recent Labs  08/26/13 0039 08/26/13 0428 08/26/13 1214  GLUCAP 131* 100* 169*    Scheduled Meds: . acetaminophen  1,000 mg Oral TID  . antiseptic oral rinse  15 mL Mouth Rinse q12n4p  . atorvastatin  40 mg Oral QODAY  . carvedilol  3.125 mg Oral BID WC  . chlorhexidine  15 mL Mouth Rinse BID  . furosemide  80 mg Oral BID  . insulin aspart  0-20 Units Subcutaneous Q4H  . insulin glargine  65 Units Subcutaneous QHS  . lip balm  1 application Topical BID  . octreotide  150 mcg Subcutaneous TID  . OxyCODONE  30 mg Oral Q12H  . piperacillin-tazobactam (ZOSYN)  IV  3.375 g Intravenous Q8H  . potassium chloride  10 mEq Intravenous Q1 Hr x 4  . psyllium  1 packet Oral Daily  . saccharomyces boulardii  250 mg Oral BID  . sodium chloride  3 mL Intravenous Q12H  . vancomycin  2,000 mg Intravenous Q24H    Continuous Infusions: . sodium chloride 50 mL/hr at 08/26/13 0031  . [name] KitchenTPN (CLINIMIX-E) Adult 125 mL/hr at 08/25/13 1716   And  . fat emulsion 240 mL (08/25/13 1717)  . TPN (CLINIMIX) Adult without lytes     And  . fat emulsion      Pryor Ochoa RD, LDN Inpatient Clinical Dietitian Pager: [phone] After Hours Pager: [phone]

## 2013-08-26 NOTE — Progress Notes (Signed)
When giving report to daytime RN, lifted patient's gown to show her patient's draining wound and saw that patient's suction tube had fallen out of pouch. Had just assessed patient's wound 30 minutes to 1 hour before this and seen the wound with suction tube intact. Patient stated that he did not notice the suction tube coming out of pouch. Dr. Zella Richer aware and [place] RN paged.

## 2013-08-26 NOTE — Progress Notes (Signed)
We did an Eakin's pouch change with a extensive exam of the entire wound, and the depth of the undermining, around the entire circumference of the wound.  Only one area was not accessed, and this has sealed to the surface granulation, at the 9 o'clock position.  This is a small area and not large enough to contain a Mepitel sheet.  This was done both with gloved hand exam and with visual exam using flash light, by myself and Kurt Cross's.  No Mepitel was found in the undermined areas. Pictures are below of the wound.    This picture show's the RLQ with new skin changes that have occurred since 08/28/13.   This picture shows the depth of the undermining on the left, and the state of the current open wound.   This shows the original stomatized fistula at the 5 o'clock position.    This shows a new area of concern at the 7 o'clock position.  I will review all of this with Dr. Zella Richer.

## 2013-08-27 LAB — CBC
HCT: 34.5 % — ABNORMAL LOW (ref 39.0–52.0)
MCH: 32 pg (ref 26.0–34.0)
MCHC: 33.3 g/dL (ref 30.0–36.0)
Platelets: 276 10*3/uL (ref 150–400)
RDW: 14 % (ref 11.5–15.5)
WBC: 9.8 10*3/uL (ref 4.0–10.5)

## 2013-08-27 LAB — GLUCOSE, CAPILLARY
Glucose-Capillary: 113 mg/dL — ABNORMAL HIGH (ref 70–99)
Glucose-Capillary: 126 mg/dL — ABNORMAL HIGH (ref 70–99)
Glucose-Capillary: 131 mg/dL — ABNORMAL HIGH (ref 70–99)
Glucose-Capillary: 114 mg/dL — ABNORMAL HIGH (ref 70–99)
Glucose-Capillary: 127 mg/dL — ABNORMAL HIGH (ref 70–99)

## 2013-08-27 LAB — BASIC METABOLIC PANEL
CO2: 32 mEq/L (ref 19–32)
Calcium: 8.7 mg/dL (ref 8.4–10.5)
Chloride: 95 mEq/L — ABNORMAL LOW (ref 96–112)
Glucose, Bld: 122 mg/dL — ABNORMAL HIGH (ref 70–99)
Sodium: 132 mEq/L — ABNORMAL LOW (ref 135–145)

## 2013-08-27 LAB — PHOSPHORUS: Phosphorus: 3.9 mg/dL (ref 2.3–4.6)

## 2013-08-27 MED ORDER — TRACE MINERALS CR-CU-F-FE-I-MN-MO-SE-ZN IV SOLN
INTRAVENOUS | Status: AC
  Administered 2013-08-27: 18:00:00 via INTRAVENOUS
  Filled 2013-08-27: qty 3000

## 2013-08-27 MED ORDER — POTASSIUM CHLORIDE 10 MEQ/100ML IV SOLN
10.0000 meq | INTRAVENOUS | Status: AC
  Administered 2013-08-27 (×4): 10 meq via INTRAVENOUS
  Filled 2013-08-27 (×4): qty 100

## 2013-08-27 MED ORDER — FAT EMULSION 20 % IV EMUL
240.0000 mL | INTRAVENOUS | Status: AC
  Administered 2013-08-27: 240 mL via INTRAVENOUS
  Filled 2013-08-27: qty 250

## 2013-08-27 NOTE — Progress Notes (Signed)
PT Cancellation Note  ___Treatment cancelled today due to medical issues with patient which prohibited therapy  ___ Treatment cancelled today due to patient receiving procedure or test   ___ Treatment cancelled today due to patient's refusal to participate   _X_ Pt declined PT stating "I'm not walking today" despite several attempts to accommodate his schedule and other nursing events.   Rica Koyanagi  PTA [facility]  Acute  Rehab Pager      [phone]

## 2013-08-27 NOTE — Progress Notes (Signed)
Occupational Therapy Treatment and goal update Patient Details Name: Kurt Cross MRN: [id number] DOB: 07-22-1951 Today's Date: 08/27/2013 Time: [id number] OT Time Calculation (min): 25 min  OT Assessment / Plan / Recommendation  History of present illness     OT comments  Pt prefers NT to assist with adls but agreeable to OT for standing endurance/upgrading goals.  Pt has not been walking to bathroom--will concentrate on this and walker safety.  Pt confident in ability to stand and move--cues for safety with lines and allowing chair to be locked prior to sitting  Follow Up Recommendations  SNF;Supervision/Assistance - 24 hour    Barriers to Discharge       Equipment Recommendations  3 in 1 bedside comode    Recommendations for Other Services    Frequency Min 2X/week   Progress towards OT Goals Progress towards OT goals:  (goals upgraded)  Plan Discharge plan remains appropriate    Precautions / Restrictions Precautions Precautions: Fall (multiple lines) Restrictions Weight Bearing Restrictions: No   Pertinent Vitals/Pain No c/o pain.  Pt reports this is a "good day"    ADL  Toilet Transfer: Simulated;+2 Total assistance Toilet Transfer: Patient Percentage: 90% Toilet Transfer Method: Stand pivot (bed to chair) Equipment Used: Rolling walker Transfers/Ambulation Related to ADLs: Pt transferred to chair approximately 2 feet away after standing for 3 minutes for adls.  +2 needed for safety as pt tends to do things his way and didn't initially  listen to first 2 cues to stop and wait when PICC line was tight.   ADL Comments: Pt reports that he has AE kit and verbalizes use of this.  He did not want to review nor participate in ADLs--he prefers NT to do it for him.      OT Diagnosis:    OT Problem List:   OT Treatment Interventions:     OT Goals(current goals can now be found in the care plan section) Acute Rehab OT Goals OT Goal Formulation: With patient Time For Goal  Achievement: 09/10/13 Potential to Achieve Goals: Good ADL Goals Pt Will Transfer to Toilet: with +2 assist (pt 90%, second person for lines ambulating to bathroom) Additional ADL Goal #1: pt will not need cues for safety during bathroom mobility  Visit Information  Last OT Received On: 08/27/13 Assistance Needed: +2 (safety)    Subjective Data      Prior Functioning       Cognition  Cognition Arousal/Alertness: Awake/alert Behavior During Therapy: WFL for tasks assessed/performed Overall Cognitive Status:  (decreased safety/awareness of lines)    Mobility  Bed Mobility Supine to Sit: 4: Min assist (using trapeze ) Details for Bed Mobility Assistance: min A for trunk to sit upright.  Pt cued for safety with PICC line. Transfers Sit to Stand: 4: Min guard;From bed;From elevated surface Details for Transfer Assistance: min guard for safety    Exercises      Balance     End of Session OT - End of Session Activity Tolerance: Patient tolerated treatment well Patient left: in chair;with call bell/phone within reach;with nursing/sitter in room  GO     Kurt Cross 08/27/2013, 2:14 PM Kurt Cross, OTR/L [phone] 08/27/2013

## 2013-08-27 NOTE — Progress Notes (Signed)
PARENTERAL NUTRITION CONSULT NOTE - FOLLOW UP  Pharmacy Consult for TNA Indication: Enterocutaneous fistula  No Known Allergies  Patient Measurements: Height: 6' (182.9 cm) Weight: 333 lb 12.4 oz (151.4 kg) IBW/kg (Calculated) : 77.6 Adjusted Body Weight: 102.6kg  Vital Signs: Temp: 98.9 F (37.2 C) (11/21 0410) Temp src: Oral (11/21 0410) BP: 138/62 mmHg (11/21 0904) Pulse Rate: 73 (11/21 0904) Intake/Output from previous day: 11/20 0701 - 11/21 0700 In: 4946.1 [I.V.:1130.8; IV Piggyback:600; TPN:3215.3] Out: 1975 [Urine:1875; Drains:100] Intake/Output from this shift:    Labs:  Recent Labs  08/25/13 0750 08/26/13 0517 08/27/13 0550  WBC 11.2* 10.1 9.8  HGB 12.0* 12.1* 11.5*  HCT 36.2* 35.9* 34.5*  PLT 326 300 276     Recent Labs  08/25/13 0750 08/26/13 0517 08/27/13 0550  NA 133* 137 132*  K 3.5 3.3* 3.4*  CL 93* 95* 95*  CO2 31 32 32  GLUCOSE 93 98 122*  BUN 27* 28* 30*  CREATININE 1.29 1.31 1.37*  CALCIUM 9.2 9.1 8.7  MG  --  1.8  --   PHOS  --  5.0* 3.9  PROT  --  7.1  --   ALBUMIN  --  2.1*  --   AST  --  14  --   ALT  --  10  --   ALKPHOS  --  29*  --   BILITOT  --  0.5  --    Estimated Creatinine Clearance: 86.9 ml/min (by C-G formula based on Cr of 1.37).    Recent Labs  08/27/13 0005 08/27/13 0408 08/27/13 0735  GLUCAP 131* 113* 126*    Medications:  Scheduled:  . acetaminophen  1,000 mg Oral TID  . antiseptic oral rinse  15 mL Mouth Rinse q12n4p  . atorvastatin  40 mg Oral QODAY  . carvedilol  3.125 mg Oral BID WC  . chlorhexidine  15 mL Mouth Rinse BID  . furosemide  80 mg Oral BID  . insulin aspart  0-20 Units Subcutaneous Q4H  . insulin glargine  65 Units Subcutaneous QHS  . lip balm  1 application Topical BID  . octreotide  150 mcg Subcutaneous TID  . OxyCODONE  30 mg Oral Q12H  . piperacillin-tazobactam (ZOSYN)  IV  3.375 g Intravenous Q8H  . potassium chloride  10 mEq Intravenous Q1 Hr x 4  . psyllium  1 packet  Oral Daily  . saccharomyces boulardii  250 mg Oral BID  . sodium chloride  3 mL Intravenous Q12H  . vancomycin  2,000 mg Intravenous Q24H   Infusions:  . sodium chloride 50 mL/hr at 08/27/13 0006  . TPN (CLINIMIX) Adult without lytes 125 mL/hr at 08/26/13 1731   And  . fat emulsion 240 mL (08/26/13 1731)    Insulin Requirements in the past 24 hours:  Lantus 65units sq qHS  14 units SSI required  - CBG q4h with resistant scale coverage 60 units regular insulin added to TNA 11/16  Nutritional Goals: Per RD 11/13 Kcal: 2400-2700 Protein: 160-180 grams Fluid: 3.3-3.5 L/day  Clinimix E5/15 at goal rate 125 ml/hr (3L/day) to provide 150g protein and 2675 kcal daily - Due to how much fluid can be placed in TNA bag must max out amount at 3L  Current Nutrition:  NPO Clinimix 5/15 at 125 ml/hr and 20% lipids at 98ml/hr   IVF: NS at 53ml/hr  Assessment: 62 yo male with large abdominal abscess with infected old [place] mesh s/p I&D abscess, removal of mesh,  debridement of abdominal wall and peritoneum. 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 TNA started 11/12 at 10pm.   Glucose: Hx DM on insulin PTA, CBG improved with addition of insulin to TNA 11/16 and increased Lantus doses  Electrolytes: Phos now WNL after removing electrolytes from TNA x 1 day.  Will attempt adding back electrolytes today and will f/u response in Phos.  K 3.4 today and is currently being replaced.  Na 132.  LFTs: wnl, albumin remains low (2.1)  TGs: wnl, 124 (11/13), 89 (11/17)  Prealbumin: low, 10.3 (11/13), 11.1 (11/17)  Monitor fluid status very closely since TNA at high goal rate.  Following I/O but unsure of accuracy; weight is not increasing. Octreotide started 11/17 to control fistula output and output has been decreasing.  Drain output recorded as 100 mL out yesterday.  TPN Access: Triple lumen PICC placed 11/12 TPN day#: 10  Plan:   Switch TNA to electrolyte-containing formulation.  At  1800, start Clinimix E 5/15 at goal rate of 142ml/hr.  Continue 20% fat emulsion at 19ml/hr.  Continue insulin 20 units/L in TNA bag and Lantus 65 units qhs  TNA to contain standard multivitamins and trace elements.  Continue IVF per MD - NS at 21ml/hr  TNA lab panels on Mondays & Thursdays.  BMET and Phos ordered for AM.  F/u clinical progress, ability to start diet and wean TNA   Hershal Coria, PharmD, BCPS Pager: [phone] 08/27/2013 11:11 AM

## 2013-08-27 NOTE — Progress Notes (Signed)
13 Days Post-Op  Subjective: No complaints.  In good spirits.  Objective: Vital signs in last 24 hours: Temp:  [98.2 F (36.8 C)-99.1 F (37.3 C)] 98.9 F (37.2 C) (11/21 0410) Pulse Rate:  [58-70] 60 (11/21 0410) Resp:  [18] 18 (11/21 0410) BP: (101-132)/(48-70) 107/56 mmHg (11/21 0410) SpO2:  [97 %-100 %] 99 % (11/21 0410) Last BM Date: 08/24/13  Intake/Output from previous day: 11/20 0701 - 11/21 0700 In: 4946.1 [I.V.:1130.8; IV Piggyback:600; TPN:3215.3] Out: 1975 [Urine:1875; Drains:100] Intake/Output this shift:    PE: General- In NAD Abdomen-soft, open wound with succus drainage and pouch on Lab Results:   Recent Labs  08/26/13 0517 08/27/13 0550  WBC 10.1 9.8  HGB 12.1* 11.5*  HCT 35.9* 34.5*  PLT 300 276   BMET  Recent Labs  08/26/13 0517 08/27/13 0550  NA 137 132*  K 3.3* 3.4*  CL 95* 95*  CO2 32 32  GLUCOSE 98 122*  BUN 28* 30*  CREATININE 1.31 1.37*  CALCIUM 9.1 8.7   PT/INR No results found for this basename: LABPROT, INR,  in the last 72 hours Comprehensive Metabolic Panel:    Component Value Date/Time   NA 132* 08/27/2013 0550   K 3.4* 08/27/2013 0550   CL 95* 08/27/2013 0550   CO2 32 08/27/2013 0550   BUN 30* 08/27/2013 0550   CREATININE 1.37* 08/27/2013 0550   GLUCOSE 122* 08/27/2013 0550   CALCIUM 8.7 08/27/2013 0550   AST 14 08/26/2013 0517   ALT 10 08/26/2013 0517   ALKPHOS 29* 08/26/2013 0517   BILITOT 0.5 08/26/2013 0517   PROT 7.1 08/26/2013 0517   ALBUMIN 2.1* 08/26/2013 0517     Studies/Results: No results found.  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 Diabetes mellitus, insulin dependent (IDDM), uncontrolled  HYPERLIPIDEMIA  Obesity, Class III, BMI 40-49.9 (morbid obesity)  OBSTRUCTIVE SLEEP APNEA  Infected prosthetic mesh of abdominal wall with GIANT abscess s/p removal 08/14/2013 Acute renal insufficiency from fistula/wound loss-improved    LOS: 16 days   Plan: Continue TPN, bowel rest, Sandostatin, wound care.   Syniyah Bourne J 08/27/2013

## 2013-08-27 NOTE — Progress Notes (Signed)
[facility] PROGRESS NOTE  Kurt Cross [id number] DOB: July 05, 1951 DOA: 08/11/2013 PCP: [name], MD  Brief History 62 y.o. male with a remote history of bowel perforation requiring exploratory laparotomy and temporary colostomy. He then developed a ventral hernia and underwent mesh repair decades ago. He was recently hospitalized and treated for possible pneumonia. He had a fall at home recently and injured his right lower leg leading to readmission. He was noted to be febrile with some abdominal wall swelling. CT scan revealed a large abdominal wall abscess. 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  He was placed on bowel rest and started on TPN. Early in the admission, the patient was fluid overloaded and received intravenous furosemide. He has been since transitioned back to his home dose of oral furosemide and remains clinically stable from CHF standpoint.   Assessment/Plan: #1 infected prosthetic Gore-Tex mesh with a giant abdominal abscess status post IandD and Gore-Tex mesh removal 08/14/2013  Patient is status post incision and drainage of abscess, Gore-Tex mesh removal, debridement of abdominal wall and peritoneum 08/14/2013.  -empiric IV Vancomycin and Zosyn.  -Blood cultures--neg  -now with EC fistula-->started on sandostatin by surgery 08/23/13 and dose increased today.  -wound care and TNA per surgery  -abx per ID  -increased long acting oxycontin 20mg  q 12hrs for pain control  -Patient was asking for OxyIR only 2-3 times per day--Dr Tat educated the patient that he may ask for up to 6 times per day if pain remains uncontrolled  -CT abdomen if WBC continues to rise  #2 sepsis  -secondary to problem #1.  -Wound cultures prelim with GPC in pairs and clusters-->Coag Neg Staph  -Remains hemodynamically stable  #3 hypotension  -resolved  -secondary to problem #2.  -Increased  carvedilol to home dose  #4 atrial fibrillation/atrial flutter  -secondary to problem #1.  -currently rate controlled. Initial EKG showed flutter waves.  -Cardiac enzymes have been negative x3. 2-D echo with EF of 50-55%. -CHADSVASc score is 4 (not ASVD plaque on abd aorta).  -Continue Coreg for rate control.  -Eliquis was discontinued initially as patient went for emergent surgery secondary to problem #1.  -d/c [place] lovenox as pt now has blood from ventral wound -scheduled for TEE cardioversion however in light of problem #1 this was cancelled - F/u with cardiology 2-3 weeks post discharge.  #5 chronic kidney disease stage III  -Creatinine is trending back down. ACE inhibitor had been discontinued.  -Plan to restart ACE inhibitor if renal function remains stable  -Serum creatinine gradually increasing with decreased sodium--suspect increased insensible loss from the patient's large ventral wound -Normal saline started today by CCS--monitor fluid status closely with a history of CHF--may need to decrease furosemide  #6 leukocytosis/fever  -secondary to problem #1.  -resolved--Currently afebrile.  -WBC up a little but remains stable  -Chest x-Tutor is negative for infiltrate. Patient with no respiratory symptoms.  -CT abdomen if WBC continues to rise  #7 chronic combined systolic and diastolic CHF  -Initial CXR with volume overload. given 4 doses of lasix 40mg  IV -Continue home dose furosemide 80 mg po twice a day  -decreased  carvedilol to 3.125mg  bid as BP was soft. Monitor closely with HR. -08/12/2013 echocardiogram shows EF 50-55%--EF improved compared to one year ago (35-40%)  #8 Uncontrolled Pain -Increased long acting OxyContin to 30 mg twice a day -Increased  OxyIR to 15 mg every 4 hours prn breakthrough pain #9 hyperlipidemia  Continue statin.  #10 4.5 cm ascending aortic aneurysm per CT scan of 08/01/2013  Stable. Asymptomatic. Continue risk factor modification with good blood  pressure control. Will need outpatient followup with vascular surgery as outpatient.  #11 type 2 diabetes  Hemoglobin A1c was 9.5 on 08/02/2013.Continue Lantus 65 units daily. -Sliding scale insulin.  -CBGs slowly creeping up with TNA  #12. Hypomagnesemia  -Repleted  #13 ?? Pauses Events overnight noted. Patient asymptomatic. If HR decreases will decrease coreg. Cardiology notified and ICD to be interrogated.   Family Communication: Updated patient and wife at beside  Disposition Plan: Home when cleared by surgery     Antibiotics Vancomycin 08/15/13>>>  Zosyn 08/14/13>>>               Procedures/Studies: Ct Abdomen Pelvis Wo Contrast  08/14/2013   CLINICAL DATA:  Fever, leukocytosis, prior hernia repairs and appendectomy  EXAM: CT ABDOMEN AND PELVIS WITHOUT CONTRAST  TECHNIQUE: Multidetector CT imaging of the abdomen and pelvis was performed following the standard protocol without intravenous contrast.  COMPARISON:  None.  FINDINGS: Note: Given body habitus, the entire anterior abdominal wall (including a portion of the abnormality) could not be included in the field of view despite two separate attempts.  Prior ventral hernia mesh repair. Just anterior to the mesh, within the anterior abdominal wall, is an abdominal wall fluid collection/abscess measuring approximately 14.5 x 18.1 x 26.1 cm (incompletely visualized). Along the superior aspect of the fluid collection, gas extends to immediately beneath the skin surface (series 10/image 27). Bowel loops are displaced posteriorly by the large collection fluid without definite communication.  A small hernia containing fat and a loop of nondilated colon is present inferiorly (sagittal image 88), to the left of midline (series 10/image 81 and 86).  Minimal left basilar atelectasis.  Cardiomegaly. Coronary atherosclerosis. ICD leads, incompletely visualized.  Moderate hepatic steatosis.  Unenhanced spleen and pancreas are unremarkable.   Low-density nodular thickening of the bilateral adrenal glands, likely reflecting adrenal adenomas.  Gallbladder is notable for layering gallstone. No intrahepatic or extrahepatic ductal dilatation.  Kidneys are unremarkable. No renal calculi or hydronephrosis.  No evidence of bowel obstruction. Prior appendectomy.  Atherosclerotic calcifications of the abdominal aorta and branch vessels.  No abdominopelvic ascites.  No suspicious abdominopelvic lymphadenopathy.  Prostate is unremarkable.  Bladder is within normal limits.  Degenerative changes of the visualized thoracolumbar spine.  IMPRESSION: 14.5 x 18.1 x 26.1 cm fluid collection/abscess within the anterior abdominal wall, as described above, incompletely visualized. The collection appears just anterior to prior ventral hernia mesh.  Given the size and proximity to the skin surface, incision and drainage is suggested.  These results were called by telephone at the time of interpretation on 08/14/2013 at 3:22 PM to Dr. Irine Seal , who verbally acknowledged these results.   Electronically Signed   By: Julian Hy M.D.   On: 08/14/2013 15:28   Dg Chest 2 View  08/01/2013   CLINICAL DATA:  Chest pain, shortness of breath.  EXAM: CHEST  2 VIEW  COMPARISON:  August 24, 2012.  FINDINGS: Stable cardiomegaly. Left-sided pacemaker is noted. No pleural effusion or pneumothorax is noted. Mild central pulmonary vascular congestion is noted which appears to be chronic. No acute pulmonary disease is noted.  IMPRESSION: No active cardiopulmonary disease.   Electronically Signed   By: Sabino Dick M.D.   On: 08/01/2013 17:06   Dg Hip Complete  Right  08/11/2013   CLINICAL DATA:  Right posterior hip pain.  EXAM: RIGHT HIP - COMPLETE 2+ VIEW  COMPARISON:  None.  FINDINGS: There is no evidence of hip fracture or dislocation. There is no evidence of arthropathy or other focal bone abnormality.  IMPRESSION: No acute osseous injury of the right hip.   Electronically  Signed   By: Kathreen Devoid   On: 08/11/2013 15:16   Ct Angio Chest Pe W/cm &/or Wo Cm  08/01/2013   CLINICAL DATA:  Shortness of Breath,  EXAM: CT ANGIOGRAPHY CHEST WITH CONTRAST  TECHNIQUE: Multidetector CT imaging of the chest was performed using the standard protocol during bolus administration of intravenous contrast. Multiplanar CT image reconstructions including MIPs were obtained to evaluate the vascular anatomy.  CONTRAST:  132mL OMNIPAQUE IOHEXOL 350 MG/ML SOLN  COMPARISON:  06/28/2012  FINDINGS: There is fairly good contrast opacification of the pulmonary artery branches with no convincing filling defects to suggest acute PE. There is good contrast opacification of the thoracic aorta with some scattered plaque, no evidence of dissection or stenosis. There is dilatation of the ascending aorta measuring up to 4.5 cm transverse diameter, stable by my measurement since prior study. Bovine brachiocephalic arterial origin anatomy without proximal stenosis. No pleural or pericardial effusion. No hilar or mediastinal adenopathy. Transvenous pacing leads are noted. Minimal linear scarring or subsegmental atelectasis in both lower lobes. Lungs otherwise clear. Visualized portions of upper abdomen unremarkable. Multiple bridging osteophytes throughout the mid and lower thoracic spine. Sternum intact.  Review of the MIP images confirms the above findings.  IMPRESSION: 1. 4.5 cm ascending aortic aneurysm without complicating features. 2. No evidence of acute pulmonary embolism or aortic dissection.   Electronically Signed   By: Arne Cleveland M.D.   On: 08/01/2013 19:55   Dg Chest Port 1 View  08/18/2013   CLINICAL DATA:  Confirm line placement  EXAM: PORTABLE CHEST - 1 VIEW  COMPARISON:  08/17/2013  FINDINGS: Two images were taken. The patient is in semi-upright position and is slightly rotated to the right. A right upper extremity PICC has been placed, the distal tip of which is in the mid superior vena cava.  Left chest wall triple lead pacemaker appears stable. Cardiomegaly. There is diffuse pulmonary vascular congestion and similar appearance of pulmonary edema. No pneumothorax or pleural effusion is appreciated.  IMPRESSION: 1. Right upper extremity PICC terminates in the mid superior vena cava. 2. Stable congestive heart failure pattern.   Electronically Signed   By: Curlene Dolphin M.D.   On: 08/18/2013 21:39   Dg Chest Port 1 View  08/17/2013   CLINICAL DATA:  Respiratory failure  EXAM: PORTABLE CHEST - 1 VIEW  COMPARISON:  08/15/2013.  FINDINGS: Persistent cardiomegaly with pulmonary vascular prominence and interstitial prominence suggesting congestive heart failure with interstitial edema noted. Interstitial pneumonitis cannot be excluded. Cardiac pacer. Lead tips in stable position. No pneumothorax. No acute osseous abnormality.  IMPRESSION: Stable chest with changes of congestive heart failure with pulmonary interstitial edema. Cardiac pacer in stable position.   Electronically Signed   By: Marcello Moores  Register   On: 08/17/2013 07:41   Dg Chest Portable 1 View  08/15/2013   CLINICAL DATA:  Sepsis, respiratory distress  EXAM: PORTABLE CHEST - 1 VIEW  COMPARISON:  Prior radiograph from 08/13/2013  FINDINGS: Left-sided pacemaker is unchanged. Cardiomegaly is stable as compared to the prior exam.  Lungs are normally inflated. There has been worsening of pulmonary vascular congestion and edema as  compared to the prior examination. No definite pleural effusion. No focal infiltrates appreciated. No pneumothorax.  Osseous structures are unchanged.  IMPRESSION: Stable cardiomegaly with interval worsening of pulmonary vascular congestion/ edema relative to 08/13/2013.   Electronically Signed   By: Jeannine Boga M.D.   On: 08/15/2013 01:16   Dg Chest Port 1 View  08/13/2013   CLINICAL DATA:  Leukocytosis.  EXAM: PORTABLE CHEST - 1 VIEW  COMPARISON:  08/01/2013  FINDINGS: No focal pulmonary consolidation is  identified. There is stable mild pulmonary venous prominence without overt edema or visualized pleural fluid. The heart remains mildly enlarged. There is stable appearance of a biventricular pacemaker.  IMPRESSION: No acute findings. Stable cardiomegaly and pulmonary venous prominence without overt edema.   Electronically Signed   By: Aletta Edouard M.D.   On: 08/13/2013 15:44    Procedures:  2-D echo 08/12/2013  X-[place] of the right hip 08/11/2013  CT abdomen and pelvis pending  Incision and drainage of abscess, Gore-Tex mesh removal, debridement of abdominal wall and peritoneum   Consultants:  Cardiology: Dr. Terrence Dupont 08/13/2013  General surgery: Dr. Johney Maine 08/14/2013  ID: Dr Megan Salon 08/15/13      Subjective: Patient denies abdominal pain. Denies fevers, chills, chest discomfort, shortness breath, nausea, vomiting and diarrhea.  Denies any headache or visual changes. Denies any dysuria. Events overnight noted with ?? Pauses.  Objective: Filed Vitals:   08/26/13 1758 08/26/13 2054 08/27/13 0410 08/27/13 0904  BP: 109/50 101/48 107/56 138/62  Pulse: 70 58 60 73  Temp:  99.1 F (37.3 C) 98.9 F (37.2 C)   TempSrc:  Oral Oral   Resp:  18 18   Height:      Weight:      SpO2:  98% 99%     Intake/Output Summary (Last 24 hours) at 08/27/13 1523 Last data filed at 08/27/13 1428  Gross per 24 hour  Intake 5180.24 ml  Output   2975 ml  Net 2205.24 ml   Weight change:  Exam:   General:  Pt is alert, follows commands appropriately, not in acute distress  HEENT: No icterus, No thrush,  Opp/AT  Cardiovascular: RRR, S1/S2, no rubs, no gallops  Respiratory: CTA bilaterally, no wheezing, no crackles, no rhonchi Abdomen: Soft/+BS, non tender, non distended, no guarding; large ventral wound with ostomy bag; no erythema--no necrosis, no crepitance    Extremities: trace LE edema, No lymphangitis, No petechiae, No rashes, no synovitis  Data Reviewed: Basic Metabolic  Panel:  Recent Labs Lab 08/21/13 0400 08/22/13 0500 08/23/13 0548 08/24/13 0400 08/24/13 0730 08/25/13 0750 08/26/13 0517 08/27/13 0550  NA 137 137 139  --  133* 133* 137 132*  K 3.5 3.5 3.5  --  3.6 3.5 3.3* 3.4*  CL 98 97 97  --  93* 93* 95* 95*  CO2 33* 34* 34*  --  34* 31 32 32  GLUCOSE 107* 179* 138*  --  108* 93 98 122*  BUN 12 15 20   --  24* 27* 28* 30*  CREATININE 1.09 1.19 1.13  --  1.28 1.29 1.31 1.37*  CALCIUM 9.0 9.3 9.4  --  9.6 9.2 9.1 8.7  MG 1.7 1.7 1.7 1.8  --   --  1.8  --   PHOS  --  4.2 4.5  --   --   --  5.0* 3.9   Liver Function Tests:  Recent Labs Lab 08/22/13 0500 08/23/13 0548 08/26/13 0517  AST 19 17 14   ALT 12 12  10  ALKPHOS 28* 29* 29*  BILITOT 0.4 0.4 0.5  PROT 6.8 7.0 7.1  ALBUMIN 2.1* 2.1* 2.1*   No results found for this basename: LIPASE, AMYLASE,  in the last 168 hours No results found for this basename: AMMONIA,  in the last 168 hours CBC:  Recent Labs Lab 08/23/13 0548 08/24/13 0400 08/25/13 0750 08/26/13 0517 08/27/13 0550  WBC 12.0* 12.0* 11.2* 10.1 9.8  NEUTROABS 8.7*  --   --   --   --   HGB 12.1* 12.4* 12.0* 12.1* 11.5*  HCT 36.2* 37.1* 36.2* 35.9* 34.5*  MCV 96.0 95.9 96.0 96.2 96.1  PLT 340 291 326 300 276   Cardiac Enzymes: No results found for this basename: CKTOTAL, CKMB, CKMBINDEX, TROPONINI,  in the last 168 hours BNP: No components found with this basename: POCBNP,  CBG:  Recent Labs Lab 08/26/13 1947 08/27/13 0005 08/27/13 0408 08/27/13 0735 08/27/13 1156  GLUCAP 135* 131* 113* 126* 122*    No results found for this or any previous visit (from the past 240 hour(s)).   Scheduled Meds: . acetaminophen  1,000 mg Oral TID  . antiseptic oral rinse  15 mL Mouth Rinse q12n4p  . atorvastatin  40 mg Oral QODAY  . carvedilol  3.125 mg Oral BID WC  . chlorhexidine  15 mL Mouth Rinse BID  . furosemide  80 mg Oral BID  . insulin aspart  0-20 Units Subcutaneous Q4H  . insulin glargine  65 Units  Subcutaneous QHS  . lip balm  1 application Topical BID  . octreotide  150 mcg Subcutaneous TID  . OxyCODONE  30 mg Oral Q12H  . piperacillin-tazobactam (ZOSYN)  IV  3.375 g Intravenous Q8H  . potassium chloride  10 mEq Intravenous Q1 Hr x 4  . psyllium  1 packet Oral Daily  . saccharomyces boulardii  250 mg Oral BID  . sodium chloride  3 mL Intravenous Q12H  . vancomycin  2,000 mg Intravenous Q24H   Continuous Infusions: . sodium chloride 50 mL/hr at 08/27/13 0006  . TPN (CLINIMIX) Adult without lytes 125 mL/hr at 08/26/13 1731   And  . fat emulsion 240 mL (08/26/13 1731)  . [name] KitchenTPN (CLINIMIX-E) Adult     And  . fat emulsion       Mujahid Jalomo, MD  [facility] Pager [phone]  If 7PM-7AM, please contact night-coverage [email or web address] Password [email or web address] 08/27/2013, 3:23 PM   LOS: 16 days

## 2013-08-27 NOTE — Progress Notes (Signed)
ANTIBIOTIC CONSULT NOTE   Pharmacy Consult for Zosyn/Vancomycin Indication: intra-abdominal abscess  No Known Allergies  Patient Measurements: Height: 6' (182.9 cm) Weight: 333 lb 12.4 oz (151.4 kg) IBW/kg (Calculated) : 77.6  Vital Signs: Temp: 98.9 F (37.2 C) (11/21 0410) Temp src: Oral (11/21 0410) BP: 138/62 mmHg (11/21 0904) Pulse Rate: 73 (11/21 0904)  Labs:  Recent Labs  08/25/13 0750 08/26/13 0517 08/27/13 0550  WBC 11.2* 10.1 9.8  HGB 12.0* 12.1* 11.5*  PLT 326 300 276  CREATININE 1.29 1.31 1.37*   Vancomycin trough 08/18/13:  12.3 mcg/ml on 2g q24h Vancomycin trough 08/24/13:  13.9 mcg/ml on 2g q24h Vancomycin trough 08/27/13:  14.5 mcg/ml on 2g q24h  Antibiotics and culture results: 11/8 >> Zosyn >>  11/9 >> Vancomycin>> ==================== 11/5 urine >> NGF 11/7 urine >> NGF 11/7 blood x2>> NGTF 11/8: abdomen incision drainage: rare coag neg staph, no anaerobes 11/9: C. Diff: negative 11/9: blood x 2: NGF 11/9: urine: NGF  Goal vancomycin trough 15-20 mcg/ml for abscess  Assessment: 62 y/o M presented with fluid collection / abscess within anterior abdominal wall.  On 08/14/13 underwent I&D with removal of infected prosthetic Gore-tex mesh and evacuation of 3L purulent fluid.    D#14 Zosyn 3.375 grams IV q8h   D#13 Vancomycin 2 grams IV q24h of planned 2-3 weeks per ID  Afebrile, WBC up slightly  SCr 1.37, has been slowly increasing  Vancomycin trough slightly subtherapeutic but with rising SCr, will continue same dose as further accumulation expected  Plan:  1. Continue Vancomycin 2 gm IV q24h 2. Continue Zosyn 3.375 gm IV q8h (extended infusion, each dose over 4 hours) 3. Monitor SCr closely and adjust doses as needed   Hershal Coria, PharmD, BCPS Pager: [phone] 08/27/2013 11:45 AM

## 2013-08-28 LAB — BASIC METABOLIC PANEL
BUN: 31 mg/dL — ABNORMAL HIGH (ref 6–23)
CO2: 31 mEq/L (ref 19–32)
Chloride: 94 mEq/L — ABNORMAL LOW (ref 96–112)
Glucose, Bld: 96 mg/dL (ref 70–99)
Potassium: 3.2 mEq/L — ABNORMAL LOW (ref 3.5–5.1)
Sodium: 134 mEq/L — ABNORMAL LOW (ref 135–145)

## 2013-08-28 LAB — CBC
HCT: 36 % — ABNORMAL LOW (ref 39.0–52.0)
Hemoglobin: 11.9 g/dL — ABNORMAL LOW (ref 13.0–17.0)
MCHC: 33.1 g/dL (ref 30.0–36.0)
MCV: 95.2 fL (ref 78.0–100.0)
RBC: 3.78 MIL/uL — ABNORMAL LOW (ref 4.22–5.81)
WBC: 9.2 10*3/uL (ref 4.0–10.5)

## 2013-08-28 LAB — GLUCOSE, CAPILLARY
Glucose-Capillary: 137 mg/dL — ABNORMAL HIGH (ref 70–99)
Glucose-Capillary: 116 mg/dL — ABNORMAL HIGH (ref 70–99)
Glucose-Capillary: 160 mg/dL — ABNORMAL HIGH (ref 70–99)

## 2013-08-28 MED ORDER — POTASSIUM CHLORIDE CRYS ER 20 MEQ PO TBCR
40.0000 meq | EXTENDED_RELEASE_TABLET | ORAL | Status: AC
  Administered 2013-08-28 (×2): 40 meq via ORAL
  Filled 2013-08-28 (×3): qty 2

## 2013-08-28 MED ORDER — POTASSIUM CHLORIDE CRYS ER 20 MEQ PO TBCR
40.0000 meq | EXTENDED_RELEASE_TABLET | Freq: Every day | ORAL | Status: DC
  Administered 2013-08-29 – 2013-10-15 (×48): 40 meq via ORAL
  Filled 2013-08-28 (×48): qty 2

## 2013-08-28 MED ORDER — FAT EMULSION 20 % IV EMUL
240.0000 mL | INTRAVENOUS | Status: AC
  Administered 2013-08-28: 18:00:00 240 mL via INTRAVENOUS
  Filled 2013-08-28: qty 250

## 2013-08-28 MED ORDER — POTASSIUM CHLORIDE 10 MEQ/50ML IV SOLN
10.0000 meq | INTRAVENOUS | Status: DC
Start: 2013-08-28 — End: 2013-08-28
  Filled 2013-08-28 (×4): qty 50

## 2013-08-28 MED ORDER — TRACE MINERALS CR-CU-F-FE-I-MN-MO-SE-ZN IV SOLN
INTRAVENOUS | Status: AC
  Administered 2013-08-28: 18:00:00 via INTRAVENOUS
  Filled 2013-08-28: qty 3000

## 2013-08-28 NOTE — Progress Notes (Signed)
14 Days Post-Op  Subjective: Drainage tube clogged.  No complaints  Objective: Vital signs in last 24 hours: Temp:  [97.4 F (36.3 C)-98.5 F (36.9 C)] 98.4 F (36.9 C) (11/22 0515) Pulse Rate:  [60-73] 68 (11/22 0515) Resp:  [18-20] 20 (11/22 0515) BP: (132-138)/(57-73) 132/73 mmHg (11/22 0515) SpO2:  [93 %-97 %] 94 % (11/22 0515) Weight:  [256 lb 6.4 oz (116.302 kg)] 256 lb 6.4 oz (116.302 kg) (11/22 0412) Last BM Date: 08/24/13  Intake/Output from previous day: 11/21 0701 - 11/22 0700 In: 4522.3 [I.V.:1352.5; IV Piggyback:1050; TPN:2069.8] Out: 4400 [Urine:4200; Drains:200] Intake/Output this shift: Total I/O In: -  Out: 275 [Urine:275]  PE: General- In NAD Abdomen-soft, open wound with succus drainage and pouch on Lab Results:   Recent Labs  08/27/13 0550 08/28/13 0420  WBC 9.8 9.2  HGB 11.5* 11.9*  HCT 34.5* 36.0*  PLT 276 280   BMET  Recent Labs  08/27/13 0550 08/28/13 0420  NA 132* 134*  K 3.4* 3.2*  CL 95* 94*  CO2 32 31  GLUCOSE 122* 96  BUN 30* 31*  CREATININE 1.37* 1.34  CALCIUM 8.7 8.9   PT/INR No results found for this basename: LABPROT, INR,  in the last 72 hours Comprehensive Metabolic Panel:    Component Value Date/Time   NA 134* 08/28/2013 0420   K 3.2* 08/28/2013 0420   CL 94* 08/28/2013 0420   CO2 31 08/28/2013 0420   BUN 31* 08/28/2013 0420   CREATININE 1.34 08/28/2013 0420   GLUCOSE 96 08/28/2013 0420   CALCIUM 8.9 08/28/2013 0420   AST 14 08/26/2013 0517   ALT 10 08/26/2013 0517   ALKPHOS 29* 08/26/2013 0517   BILITOT 0.5 08/26/2013 0517   PROT 7.1 08/26/2013 0517   ALBUMIN 2.1* 08/26/2013 0517     Studies/Results: No results found.  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   Yolanda Huffstetler J 08/28/2013

## 2013-08-28 NOTE — Progress Notes (Signed)
[facility] PROGRESS NOTE  ANTWIAN COLMENARES [id number] DOB: [birth year] DOA: 08/11/2013 PCP: [name], MD  Brief History 62 y.o. male with a remote history of bowel perforation requiring exploratory laparotomy and temporary colostomy. He then developed a ventral hernia and underwent mesh repair decades ago. He was recently hospitalized and treated for possible pneumonia. He had a fall at home recently and injured his right lower leg leading to readmission. He was noted to be febrile with some abdominal wall swelling. CT scan revealed a large abdominal wall abscess. 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  He was placed on bowel rest and started on TPN. Early in the admission, the patient was fluid overloaded and received intravenous furosemide. He has been since transitioned back to his home dose of oral furosemide and remains clinically stable from CHF standpoint.   Assessment/Plan: #1 infected prosthetic Gore-Tex mesh with a giant abdominal abscess status post IandD and Gore-Tex mesh removal 08/14/2013  Patient is status post incision and drainage of abscess, Gore-Tex mesh removal, debridement of abdominal wall and peritoneum 08/14/2013.  -empiric IV Vancomycin and Zosyn.  -Blood cultures--neg  -now with EC fistula-->started on sandostatin by surgery 08/23/13 and dose increased.  -wound care and TNA per surgery  -abx per ID  -Continue long acting oxycontin 30mg  q 12hrs for pain control  -Patient was asking for OxyIR only 2-3 times per day--Dr Tat educated the patient that he may ask for up to 6 times per day if pain remains uncontrolled  -CT abdomen if WBC continues to rise  #2 sepsis  -secondary to problem #1.  -Wound cultures prelim with GPC in pairs and clusters-->Coag Neg Staph  -Remains hemodynamically stable  Continue IV antibiotics. #3 hypotension  -resolved  -secondary to  problem #2.  -Increased carvedilol to home dose  #4 atrial fibrillation/atrial flutter  -secondary to problem #1.  -currently rate controlled. Initial EKG showed flutter waves.  -Cardiac enzymes have been negative x3. 2-D echo with EF of 50-55%. -CHADSVASc score is 4 (not ASVD plaque on abd aorta).  -Continue Coreg for rate control.  -Eliquis was discontinued initially as patient went for emergent surgery secondary to problem #1.  -d/c [place] lovenox as pt now has blood from ventral wound -scheduled for TEE cardioversion however in light of problem #1 this was cancelled - F/u with cardiology 2-3 weeks post discharge.  #5 chronic kidney disease stage III  -Creatinine is trending back down. ACE inhibitor had been discontinued.  -Plan to restart ACE inhibitor if renal function remains stable  -Serum creatinine gradually increasing with decreased sodium--suspect increased insensible loss from the patient's large ventral wound -Normal saline started today by CCS--monitor fluid status closely with a history of CHF--may need to decrease furosemide  #6 leukocytosis/fever  -secondary to problem #1.  -resolved--Currently afebrile.  -WBC up a little but remains stable  -Chest x-Mullinax is negative for infiltrate. Patient with no respiratory symptoms.  -CT abdomen if WBC continues to rise  #7 chronic combined systolic and diastolic CHF  -Initial CXR with volume overload. given 4 doses of lasix 40mg  IV -Continue home dose furosemide 80 mg po twice a day  -decreased  carvedilol to 3.125mg  bid as BP was soft. Monitor closely with HR. -08/12/2013 echocardiogram shows EF 50-55%--EF improved compared to one year ago (35-40%)  #8 Uncontrolled Pain -Continue long acting OxyContin to 30 mg twice a  day -Continue OxyIR to 15 mg every 4 hours prn breakthrough pain #9 hyperlipidemia  Continue statin.  #10 4.5 cm ascending aortic aneurysm per CT scan of 08/01/2013  Stable. Asymptomatic. Continue risk factor  modification with good blood pressure control. Will need outpatient followup with vascular surgery as outpatient.  #11 type 2 diabetes  Hemoglobin A1c was 9.5 on 08/02/2013.Continue Lantus 65 units daily. -Sliding scale insulin.  -CBGs slowly creeping up with TNA  #12. Hypomagnesemia  -Repleted  #13 ?? Pauses Patient asymptomatic. If HR decreases will decrease coreg. Cardiology notified and ICD to be interrogated.   Family Communication: Updated patient and wife at beside  Disposition Plan: Home when cleared by surgery     Antibiotics Vancomycin 08/15/13>>>  Zosyn 08/14/13>>>               Procedures/Studies: Ct Abdomen Pelvis Wo Contrast  08/14/2013   CLINICAL DATA:  Fever, leukocytosis, prior hernia repairs and appendectomy  EXAM: CT ABDOMEN AND PELVIS WITHOUT CONTRAST  TECHNIQUE: Multidetector CT imaging of the abdomen and pelvis was performed following the standard protocol without intravenous contrast.  COMPARISON:  None.  FINDINGS: Note: Given body habitus, the entire anterior abdominal wall (including a portion of the abnormality) could not be included in the field of view despite two separate attempts.  Prior ventral hernia mesh repair. Just anterior to the mesh, within the anterior abdominal wall, is an abdominal wall fluid collection/abscess measuring approximately 14.5 x 18.1 x 26.1 cm (incompletely visualized). Along the superior aspect of the fluid collection, gas extends to immediately beneath the skin surface (series 10/image 27). Bowel loops are displaced posteriorly by the large collection fluid without definite communication.  A small hernia containing fat and a loop of nondilated colon is present inferiorly (sagittal image 88), to the left of midline (series 10/image 81 and 86).  Minimal left basilar atelectasis.  Cardiomegaly. Coronary atherosclerosis. ICD leads, incompletely visualized.  Moderate hepatic steatosis.  Unenhanced spleen and pancreas are unremarkable.   Low-density nodular thickening of the bilateral adrenal glands, likely reflecting adrenal adenomas.  Gallbladder is notable for layering gallstone. No intrahepatic or extrahepatic ductal dilatation.  Kidneys are unremarkable. No renal calculi or hydronephrosis.  No evidence of bowel obstruction. Prior appendectomy.  Atherosclerotic calcifications of the abdominal aorta and branch vessels.  No abdominopelvic ascites.  No suspicious abdominopelvic lymphadenopathy.  Prostate is unremarkable.  Bladder is within normal limits.  Degenerative changes of the visualized thoracolumbar spine.  IMPRESSION: 14.5 x 18.1 x 26.1 cm fluid collection/abscess within the anterior abdominal wall, as described above, incompletely visualized. The collection appears just anterior to prior ventral hernia mesh.  Given the size and proximity to the skin surface, incision and drainage is suggested.  These results were called by telephone at the time of interpretation on 08/14/2013 at 3:22 PM to Dr. Irine Seal , who verbally acknowledged these results.   Electronically Signed   By: Julian Hy M.D.   On: 08/14/2013 15:28   Dg Chest 2 View  08/01/2013   CLINICAL DATA:  Chest pain, shortness of breath.  EXAM: CHEST  2 VIEW  COMPARISON:  August 24, 2012.  FINDINGS: Stable cardiomegaly. Left-sided pacemaker is noted. No pleural effusion or pneumothorax is noted. Mild central pulmonary vascular congestion is noted which appears to be chronic. No acute pulmonary disease is noted.  IMPRESSION: No active cardiopulmonary disease.   Electronically Signed   By: Sabino Dick M.D.   On: 08/01/2013 17:06   Dg Hip Complete Right  08/11/2013   CLINICAL DATA:  Right posterior hip pain.  EXAM: RIGHT HIP - COMPLETE 2+ VIEW  COMPARISON:  None.  FINDINGS: There is no evidence of hip fracture or dislocation. There is no evidence of arthropathy or other focal bone abnormality.  IMPRESSION: No acute osseous injury of the right hip.   Electronically  Signed   By: Kathreen Devoid   On: 08/11/2013 15:16   Ct Angio Chest Pe W/cm &/or Wo Cm  08/01/2013   CLINICAL DATA:  Shortness of Breath,  EXAM: CT ANGIOGRAPHY CHEST WITH CONTRAST  TECHNIQUE: Multidetector CT imaging of the chest was performed using the standard protocol during bolus administration of intravenous contrast. Multiplanar CT image reconstructions including MIPs were obtained to evaluate the vascular anatomy.  CONTRAST:  145mL OMNIPAQUE IOHEXOL 350 MG/ML SOLN  COMPARISON:  06/28/2012  FINDINGS: There is fairly good contrast opacification of the pulmonary artery branches with no convincing filling defects to suggest acute PE. There is good contrast opacification of the thoracic aorta with some scattered plaque, no evidence of dissection or stenosis. There is dilatation of the ascending aorta measuring up to 4.5 cm transverse diameter, stable by my measurement since prior study. Bovine brachiocephalic arterial origin anatomy without proximal stenosis. No pleural or pericardial effusion. No hilar or mediastinal adenopathy. Transvenous pacing leads are noted. Minimal linear scarring or subsegmental atelectasis in both lower lobes. Lungs otherwise clear. Visualized portions of upper abdomen unremarkable. Multiple bridging osteophytes throughout the mid and lower thoracic spine. Sternum intact.  Review of the MIP images confirms the above findings.  IMPRESSION: 1. 4.5 cm ascending aortic aneurysm without complicating features. 2. No evidence of acute pulmonary embolism or aortic dissection.   Electronically Signed   By: Arne Cleveland M.D.   On: 08/01/2013 19:55   Dg Chest Port 1 View  08/18/2013   CLINICAL DATA:  Confirm line placement  EXAM: PORTABLE CHEST - 1 VIEW  COMPARISON:  08/17/2013  FINDINGS: Two images were taken. The patient is in semi-upright position and is slightly rotated to the right. A right upper extremity PICC has been placed, the distal tip of which is in the mid superior vena cava.  Left chest wall triple lead pacemaker appears stable. Cardiomegaly. There is diffuse pulmonary vascular congestion and similar appearance of pulmonary edema. No pneumothorax or pleural effusion is appreciated.  IMPRESSION: 1. Right upper extremity PICC terminates in the mid superior vena cava. 2. Stable congestive heart failure pattern.   Electronically Signed   By: Curlene Dolphin M.D.   On: 08/18/2013 21:39   Dg Chest Port 1 View  08/17/2013   CLINICAL DATA:  Respiratory failure  EXAM: PORTABLE CHEST - 1 VIEW  COMPARISON:  08/15/2013.  FINDINGS: Persistent cardiomegaly with pulmonary vascular prominence and interstitial prominence suggesting congestive heart failure with interstitial edema noted. Interstitial pneumonitis cannot be excluded. Cardiac pacer. Lead tips in stable position. No pneumothorax. No acute osseous abnormality.  IMPRESSION: Stable chest with changes of congestive heart failure with pulmonary interstitial edema. Cardiac pacer in stable position.   Electronically Signed   By: Marcello Moores  Register   On: 08/17/2013 07:41   Dg Chest Portable 1 View  08/15/2013   CLINICAL DATA:  Sepsis, respiratory distress  EXAM: PORTABLE CHEST - 1 VIEW  COMPARISON:  Prior radiograph from 08/13/2013  FINDINGS: Left-sided pacemaker is unchanged. Cardiomegaly is stable as compared to the prior exam.  Lungs are normally inflated. There has been worsening of pulmonary vascular congestion and edema as compared to  the prior examination. No definite pleural effusion. No focal infiltrates appreciated. No pneumothorax.  Osseous structures are unchanged.  IMPRESSION: Stable cardiomegaly with interval worsening of pulmonary vascular congestion/ edema relative to 08/13/2013.   Electronically Signed   By: Jeannine Boga M.D.   On: 08/15/2013 01:16   Dg Chest Port 1 View  08/13/2013   CLINICAL DATA:  Leukocytosis.  EXAM: PORTABLE CHEST - 1 VIEW  COMPARISON:  08/01/2013  FINDINGS: No focal pulmonary consolidation is  identified. There is stable mild pulmonary venous prominence without overt edema or visualized pleural fluid. The heart remains mildly enlarged. There is stable appearance of a biventricular pacemaker.  IMPRESSION: No acute findings. Stable cardiomegaly and pulmonary venous prominence without overt edema.   Electronically Signed   By: Aletta Edouard M.D.   On: 08/13/2013 15:44    Procedures:  2-D echo 08/12/2013  X-Leugers of the right hip 08/11/2013  CT abdomen and pelvis pending  Incision and drainage of abscess, Gore-Tex mesh removal, debridement of abdominal wall and peritoneum   Consultants:  Cardiology: Dr. Terrence Dupont 08/13/2013  General surgery: Dr. Johney Maine 08/14/2013  ID: Dr Megan Salon 08/15/13      Subjective: Patient denies abdominal pain. Denies fevers, chills, chest discomfort, shortness breath, nausea, vomiting and diarrhea.  Denies any headache or visual changes. Denies any dysuria.  Objective: Filed Vitals:   08/27/13 1643 08/27/13 2015 08/28/13 0412 08/28/13 0515  BP: 133/57 136/61  132/73  Pulse: 62 60  68  Temp: 98.5 F (36.9 C) 97.4 F (36.3 C)  98.4 F (36.9 C)  TempSrc: Oral Oral  Oral  Resp: 18 20  20   Height:      Weight:   116.302 kg (256 lb 6.4 oz)   SpO2: 93% 97%  94%    Intake/Output Summary (Last 24 hours) at 08/28/13 1152 Last data filed at 08/28/13 1100  Gross per 24 hour  Intake 3872.25 ml  Output   5650 ml  Net -1777.75 ml   Weight change:  Exam:   General:  Pt is alert, follows commands appropriately, not in acute distress  HEENT: No icterus, No thrush,  [place]/AT  Cardiovascular: RRR, S1/S2, no rubs, no gallops  Respiratory: CTA bilaterally, no wheezing, no crackles, no rhonchi Abdomen: Soft/+BS, non tender, non distended, no guarding; large ventral open wound with ostomy bag; no erythema--no necrosis, no crepitance    Extremities: trace LE edema, No lymphangitis, No petechiae, No rashes, no synovitis  Data Reviewed: Basic Metabolic  Panel:  Recent Labs Lab 08/22/13 0500 08/23/13 0548 08/24/13 0400 08/24/13 0730 08/25/13 0750 08/26/13 0517 08/27/13 0550 08/28/13 0420  NA 137 139  --  133* 133* 137 132* 134*  K 3.5 3.5  --  3.6 3.5 3.3* 3.4* 3.2*  CL 97 97  --  93* 93* 95* 95* 94*  CO2 34* 34*  --  34* 31 32 32 31  GLUCOSE 179* 138*  --  108* 93 98 122* 96  BUN 15 20  --  24* 27* 28* 30* 31*  CREATININE 1.19 1.13  --  1.28 1.29 1.31 1.37* 1.34  CALCIUM 9.3 9.4  --  9.6 9.2 9.1 8.7 8.9  MG 1.7 1.7 1.8  --   --  1.8  --   --   PHOS 4.2 4.5  --   --   --  5.0* 3.9 3.6   Liver Function Tests:  Recent Labs Lab 08/22/13 0500 08/23/13 0548 08/26/13 0517  AST 19 17 14   ALT 12  12 10  ALKPHOS 28* 29* 29*  BILITOT 0.4 0.4 0.5  PROT 6.8 7.0 7.1  ALBUMIN 2.1* 2.1* 2.1*   No results found for this basename: LIPASE, AMYLASE,  in the last 168 hours No results found for this basename: AMMONIA,  in the last 168 hours CBC:  Recent Labs Lab 08/23/13 0548 08/24/13 0400 08/25/13 0750 08/26/13 0517 08/27/13 0550 08/28/13 0420  WBC 12.0* 12.0* 11.2* 10.1 9.8 9.2  NEUTROABS 8.7*  --   --   --   --   --   HGB 12.1* 12.4* 12.0* 12.1* 11.5* 11.9*  HCT 36.2* 37.1* 36.2* 35.9* 34.5* 36.0*  MCV 96.0 95.9 96.0 96.2 96.1 95.2  PLT 340 291 326 300 276 280   Cardiac Enzymes: No results found for this basename: CKTOTAL, CKMB, CKMBINDEX, TROPONINI,  in the last 168 hours BNP: No components found with this basename: POCBNP,  CBG:  Recent Labs Lab 08/27/13 1642 08/27/13 2008 08/28/13 0032 08/28/13 0406 08/28/13 0754  GLUCAP 114* 127* 137* 116* 110*    No results found for this or any previous visit (from the past 240 hour(s)).   Scheduled Meds: . acetaminophen  1,000 mg Oral TID  . antiseptic oral rinse  15 mL Mouth Rinse q12n4p  . atorvastatin  40 mg Oral QODAY  . carvedilol  3.125 mg Oral BID WC  . chlorhexidine  15 mL Mouth Rinse BID  . furosemide  80 mg Oral BID  . insulin aspart  0-20 Units  Subcutaneous Q4H  . insulin glargine  65 Units Subcutaneous QHS  . lip balm  1 application Topical BID  . octreotide  150 mcg Subcutaneous TID  . OxyCODONE  30 mg Oral Q12H  . piperacillin-tazobactam (ZOSYN)  IV  3.375 g Intravenous Q8H  . potassium chloride  40 mEq Oral Q4H  . [START ON 08/29/2013] potassium chloride  40 mEq Oral Daily  . psyllium  1 packet Oral Daily  . saccharomyces boulardii  250 mg Oral BID  . sodium chloride  3 mL Intravenous Q12H  . vancomycin  2,000 mg Intravenous Q24H   Continuous Infusions: . sodium chloride 50 mL/hr at 08/27/13 2239  . [name] KitchenTPN (CLINIMIX-E) Adult 125 mL/hr at 08/27/13 1736   And  . fat emulsion 240 mL (08/27/13 1736)  . [name] KitchenTPN (CLINIMIX-E) Adult     And  . fat emulsion       THOMPSON,DANIEL, MD  [facility] Pager [phone]  If 7PM-7AM, please contact night-coverage [email or web address] Password [facility] 08/28/2013, 11:52 AM   LOS: 17 days

## 2013-08-28 NOTE — Progress Notes (Signed)
PARENTERAL NUTRITION CONSULT NOTE - FOLLOW UP  Pharmacy Consult for TNA Indication: Enterocutaneous fistula  No Known Allergies  Patient Measurements: Height: 6' (182.9 cm) Weight: 256 lb 6.4 oz (116.302 kg) (weighed w/ no extra bed devices and standard linen) IBW/kg (Calculated) : 77.6 Adjusted Body Weight: 102.6kg  Vital Signs: Temp: 98.4 F (36.9 C) (11/22 0515) Temp src: Oral (11/22 0515) BP: 132/73 mmHg (11/22 0515) Pulse Rate: 68 (11/22 0515) Intake/Output from previous day: 11/21 0701 - 11/22 0700 In: 4522.3 [I.V.:1352.5; IV Piggyback:1050; TPN:2069.8] Out: 4400 [Urine:4200; Drains:200] Intake/Output from this shift:    Labs:  Recent Labs  08/26/13 0517 08/27/13 0550 08/28/13 0420  WBC 10.1 9.8 9.2  HGB 12.1* 11.5* 11.9*  HCT 35.9* 34.5* 36.0*  PLT 300 276 280     Recent Labs  08/26/13 0517 08/27/13 0550 08/28/13 0420  NA 137 132* 134*  K 3.3* 3.4* 3.2*  CL 95* 95* 94*  CO2 32 32 31  GLUCOSE 98 122* 96  BUN 28* 30* 31*  CREATININE 1.31 1.37* 1.34  CALCIUM 9.1 8.7 8.9  MG 1.8  --   --   PHOS 5.0* 3.9 3.6  PROT 7.1  --   --   ALBUMIN 2.1*  --   --   AST 14  --   --   ALT 10  --   --   ALKPHOS 29*  --   --   BILITOT 0.5  --   --    Estimated Creatinine Clearance: 77.2 ml/min (by C-G formula based on Cr of 1.34).    Recent Labs  08/27/13 2008 08/28/13 0032 08/28/13 0406  GLUCAP 127* 137* 116*    Medications:  Scheduled:  . acetaminophen  1,000 mg Oral TID  . antiseptic oral rinse  15 mL Mouth Rinse q12n4p  . atorvastatin  40 mg Oral QODAY  . carvedilol  3.125 mg Oral BID WC  . chlorhexidine  15 mL Mouth Rinse BID  . furosemide  80 mg Oral BID  . insulin aspart  0-20 Units Subcutaneous Q4H  . insulin glargine  65 Units Subcutaneous QHS  . lip balm  1 application Topical BID  . octreotide  150 mcg Subcutaneous TID  . OxyCODONE  30 mg Oral Q12H  . piperacillin-tazobactam (ZOSYN)  IV  3.375 g Intravenous Q8H  . psyllium  1 packet  Oral Daily  . saccharomyces boulardii  250 mg Oral BID  . sodium chloride  3 mL Intravenous Q12H  . vancomycin  2,000 mg Intravenous Q24H   Infusions:  . sodium chloride 50 mL/hr at 08/27/13 2239  . [name] KitchenTPN (CLINIMIX-E) Adult 125 mL/hr at 08/27/13 1736   And  . fat emulsion 240 mL (08/27/13 1736)    Insulin Requirements in the past 24 hours:  Lantus 65units sq qHS  12 units SSI required  - CBG q4h with resistant scale coverage 60 units regular insulin added to TNA 11/16  Nutritional Goals: Per RD 11/13 Kcal: 2400-2700 Protein: 160-180 grams Fluid: 3.3-3.5 L/day  Clinimix E5/15 at goal rate 125 ml/hr (3L/day) to provide 150g protein and 2675 kcal daily - Due to how much fluid can be placed in TNA bag must max out amount at 3L  Current Nutrition:  NPO Clinimix E 5/15 at 125 ml/hr and 20% lipids at 49ml/hr   IVF: NS at 68ml/hr  Assessment: 62 yo male with large abdominal abscess with infected old Gore Tex mesh s/p I&D abscess, removal of mesh, debridement of abdominal wall and peritoneum.  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 TNA started 11/12 at 10pm.   Glucose: Hx DM on insulin PTA, CBG improved with addition of insulin to TNA 11/16 and increased Lantus doses  Electrolytes: Phos now WNL after removing electrolytes from TNA x 1 day.  Electrolytes added back 11/21 - following response in Phos. K 3.2 today despite replacement yesterday.  Na 134.  LFTs: wnl, albumin remains low (2.1)  TGs: wnl, 124 (11/13), 89 (11/17)  Prealbumin: low, 10.3 (11/13), 11.1 (11/17)  Monitor fluid status very closely since TNA at high goal rate.  Following I/O but unsure of accuracy; weight is not increasing. Octreotide started 11/17 to control fistula output and output has been decreasing.  Drain output recorded as 225 mL out yesterday.  TPN Access: Triple lumen PICC placed 11/12 TPN day#: 11  Plan:   Continue Clinimix E 5/15 at goal rate of 111ml/hr.  Continue 20% fat emulsion  at 7ml/hr.  Continue insulin 20 units/L in TNA bag and Lantus 65 units qhs  KCl 68meq x 4 more runs today  TNA to contain standard multivitamins and trace elements.  Continue IVF per MD - NS at 33ml/hr  TNA lab panels on Mondays & Thursdays.  BMET and Phos ordered for AM.  F/u clinical progress, ability to start diet and wean TNA   Peggyann Juba, PharmD, BCPS Pager: [phone]  08/28/2013 7:19 AM

## 2013-08-29 LAB — CBC
HCT: 33.9 % — ABNORMAL LOW (ref 39.0–52.0)
Hemoglobin: 11.7 g/dL — ABNORMAL LOW (ref 13.0–17.0)
MCHC: 34.5 g/dL (ref 30.0–36.0)
RBC: 3.57 MIL/uL — ABNORMAL LOW (ref 4.22–5.81)
WBC: 8.5 10*3/uL (ref 4.0–10.5)

## 2013-08-29 LAB — BASIC METABOLIC PANEL
Calcium: 8.9 mg/dL (ref 8.4–10.5)
Creatinine, Ser: 1.35 mg/dL (ref 0.50–1.35)
GFR calc Af Amer: 64 mL/min — ABNORMAL LOW (ref >90)
GFR calc non Af Amer: 56 mL/min — ABNORMAL LOW (ref >90)
Glucose, Bld: 86 mg/dL (ref 70–99)
Potassium: 3.4 mEq/L — ABNORMAL LOW (ref 3.5–5.1)
Sodium: 135 mEq/L (ref 135–145)

## 2013-08-29 LAB — GLUCOSE, CAPILLARY
Glucose-Capillary: 93 mg/dL (ref 70–99)
Glucose-Capillary: 110 mg/dL — ABNORMAL HIGH (ref 70–99)
Glucose-Capillary: 142 mg/dL — ABNORMAL HIGH (ref 70–99)

## 2013-08-29 MED ORDER — OXYCODONE HCL ER 15 MG PO T12A
15.0000 mg | EXTENDED_RELEASE_TABLET | Freq: Two times a day (BID) | ORAL | Status: DC
  Administered 2013-08-29 – 2013-10-11 (×73): 15 mg via ORAL
  Filled 2013-08-29 (×75): qty 1

## 2013-08-29 MED ORDER — KETOROLAC TROMETHAMINE 30 MG/ML IJ SOLN
30.0000 mg | Freq: Four times a day (QID) | INTRAMUSCULAR | Status: DC | PRN
  Administered 2013-08-29: 30 mg via INTRAVENOUS
  Filled 2013-08-29: qty 1

## 2013-08-29 MED ORDER — M.V.I. ADULT IV INJ
INTRAVENOUS | Status: AC
  Administered 2013-08-29: 18:00:00 via INTRAVENOUS
  Filled 2013-08-29: qty 3000

## 2013-08-29 MED ORDER — FAT EMULSION 20 % IV EMUL
240.0000 mL | INTRAVENOUS | Status: AC
  Administered 2013-08-29: 240 mL via INTRAVENOUS
  Filled 2013-08-29: qty 250

## 2013-08-29 NOTE — Progress Notes (Signed)
PARENTERAL NUTRITION CONSULT NOTE - FOLLOW UP  Pharmacy Consult for TNA Indication: Enterocutaneous fistula  No Known Allergies  Patient Measurements: Height: 6' (182.9 cm) Weight: 332 lb 14.3 oz (151 kg) IBW/kg (Calculated) : 77.6 Adjusted Body Weight: 102.6kg  Vital Signs: Temp: 98.3 F (36.8 C) (11/23 0448) Temp src: Oral (11/23 0448) BP: 130/62 mmHg (11/23 0448) Pulse Rate: 60 (11/23 0448) Intake/Output from previous day: 11/22 0701 - 11/23 0700 In: 5789 [P.O.:960; I.V.:1056; IV Piggyback:650; TPN:3123] Out: 4650 [Urine:3575; Drains:1075] Intake/Output from this shift:    Labs:  Recent Labs  08/27/13 0550 08/28/13 0420 08/29/13 0500  WBC 9.8 9.2 8.5  HGB 11.5* 11.9* 11.7*  HCT 34.5* 36.0* 33.9*  PLT 276 280 266     Recent Labs  08/27/13 0550 08/28/13 0420 08/29/13 0500  NA 132* 134* 135  K 3.4* 3.2* 3.4*  CL 95* 94* 95*  CO2 32 31 33*  GLUCOSE 122* 96 86  BUN 30* 31* 30*  CREATININE 1.37* 1.34 1.35  CALCIUM 8.7 8.9 8.9  PHOS 3.9 3.6  --    Estimated Creatinine Clearance: 88.1 ml/min (by C-G formula based on Cr of 1.35).    Recent Labs  08/28/13 2015 08/29/13 0028 08/29/13 0427  GLUCAP 112* 97 93    Medications:  Scheduled:  . acetaminophen  1,000 mg Oral TID  . antiseptic oral rinse  15 mL Mouth Rinse q12n4p  . atorvastatin  40 mg Oral QODAY  . carvedilol  3.125 mg Oral BID WC  . chlorhexidine  15 mL Mouth Rinse BID  . furosemide  80 mg Oral BID  . insulin aspart  0-20 Units Subcutaneous Q4H  . insulin glargine  65 Units Subcutaneous QHS  . lip balm  1 application Topical BID  . octreotide  150 mcg Subcutaneous TID  . OxyCODONE  30 mg Oral Q12H  . piperacillin-tazobactam (ZOSYN)  IV  3.375 g Intravenous Q8H  . potassium chloride  40 mEq Oral Daily  . psyllium  1 packet Oral Daily  . saccharomyces boulardii  250 mg Oral BID  . sodium chloride  3 mL Intravenous Q12H  . vancomycin  2,000 mg Intravenous Q24H   Infusions:  . sodium  chloride 50 mL/hr at 08/28/13 1640  . [name] KitchenTPN (CLINIMIX-E) Adult 125 mL/hr at 08/28/13 1758   And  . fat emulsion 240 mL (08/28/13 1759)    Insulin Requirements in the past 24 hours:  Lantus 65units sq qHS  4 units SSI required  - CBG q4h with resistant scale coverage 60 units regular insulin added to TNA 11/16  Nutritional Goals: Per RD 11/13 Kcal: 2400-2700 Protein: 160-180 grams Fluid: 3.3-3.5 L/day  Clinimix E5/15 at goal rate 125 ml/hr (3L/day) to provide 150g protein and 2675 kcal daily - Due to how much fluid can be placed in TNA bag must max out amount at 3L  Current Nutrition:  NPO Clinimix E 5/15 at 125 ml/hr and 20% lipids at 56ml/hr   IVF: NS at 31ml/hr  Assessment: 62 yo male with large abdominal abscess with infected old Gore Tex mesh s/p I&D abscess, removal of mesh, debridement of abdominal wall and peritoneum. 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 TNA started 11/12 at 10pm.   Glucose: Hx DM on insulin PTA, CBG improved with addition of insulin to TNA 11/16 and increased Lantus doses. Now trending down (93 this am with serum glucose 86)  Electrolytes: Phos now WNL after removing electrolytes from TNA x 1 day.  Electrolytes added back  11/21 - following response in Phos. K minimally improved to 3.4 today despite daily replacement (4meq PO yesterday).  Na now wnl.  LFTs: wnl, albumin remains low (2.1)  TGs: wnl, 124 (11/13), 89 (11/17)  Prealbumin: low, 10.3 (11/13), 11.1 (11/17)  Monitor fluid status very closely since TNA at high goal rate.  Following I/O but unsure of accuracy; weight is not increasing. Octreotide started 11/17 to control fistula output and output had been decreasing, although clogged yesterday.  Drain output recorded as 1075 mL out yesterday.  TPN Access: Triple lumen PICC placed 11/12 TPN day#: 12  Plan:   Continue Clinimix E 5/15 at goal rate of 167ml/hr.  Continue 20% fat emulsion at 39ml/hr.  Decrease insulin to 50  units/L per 3L bag to prevent further drop in CBGs, continue Lantus 65 units qhs  KCl 23meq PO daily per MD  TNA to contain standard multivitamins and trace elements.  Continue IVF per MD - NS at 82ml/hr  TNA lab panels on Mondays & Thursdays.   F/u clinical progress, ability to start diet and wean TNA   Peggyann Juba, PharmD, BCPS Pager: [phone]  08/29/2013 7:04 AM

## 2013-08-29 NOTE — Progress Notes (Signed)
Pt placed on Auto CPAP via FFM with 3 LPM O2 bleed in.  Pt tolerating well at this time, RT to monitor and assess as needed.

## 2013-08-29 NOTE — Progress Notes (Signed)
Dressings change per MD order...[name] KitchenMarland KitchenMarland Kitchen Pt tolerated well. Wound noted to have granulated tissue with scant amount of drainage. Pt tolerated well. MD at bedside. Cont with plan of care

## 2013-08-29 NOTE — Progress Notes (Signed)
15 Days Post-Op  Subjective: Drainage system has been fixed.  No complaints.  Objective: Vital signs in last 24 hours: Temp:  [98.2 F (36.8 C)-98.8 F (37.1 C)] 98.3 F (36.8 C) (11/23 0448) Pulse Rate:  [60-65] 60 (11/23 0448) Resp:  [16-20] 20 (11/23 0448) BP: (115-130)/(62-66) 130/62 mmHg (11/23 0448) SpO2:  [95 %-100 %] 100 % (11/23 0448) Weight:  [331 lb 9.2 oz (150.4 kg)-332 lb 14.3 oz (151 kg)] 332 lb 14.3 oz (151 kg) (11/23 0448) Last BM Date: 08/24/13  Intake/Output from previous day: 11/22 0701 - 11/23 0700 In: 5789 [P.O.:960; I.V.:1056; IV Piggyback:650; TPN:3123] Out: 4650 [Urine:3575; Drains:1075] Intake/Output this shift:    PE: General- In NAD Abdomen-soft, open wound with bilious drainage and pouch with red rubber catheter for suction positioned well Lab Results:   Recent Labs  08/28/13 0420 08/29/13 0500  WBC 9.2 8.5  HGB 11.9* 11.7*  HCT 36.0* 33.9*  PLT 280 266   BMET  Recent Labs  08/28/13 0420 08/29/13 0500  NA 134* 135  K 3.2* 3.4*  CL 94* 95*  CO2 31 33*  GLUCOSE 96 86  BUN 31* 30*  CREATININE 1.34 1.35  CALCIUM 8.9 8.9   PT/INR No results found for this basename: LABPROT, INR,  in the last 72 hours Comprehensive Metabolic Panel:    Component Value Date/Time   NA 135 08/29/2013 0500   K 3.4* 08/29/2013 0500   CL 95* 08/29/2013 0500   CO2 33* 08/29/2013 0500   BUN 30* 08/29/2013 0500   CREATININE 1.35 08/29/2013 0500   GLUCOSE 86 08/29/2013 0500   CALCIUM 8.9 08/29/2013 0500   AST 14 08/26/2013 0517   ALT 10 08/26/2013 0517   ALKPHOS 29* 08/26/2013 0517   BILITOT 0.5 08/26/2013 0517   PROT 7.1 08/26/2013 0517   ALBUMIN 2.1* 08/26/2013 0517     Studies/Results: No results found.  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 Diabetes mellitus, insulin dependent (IDDM), uncontrolled  HYPERLIPIDEMIA  Obesity, Class III, BMI 40-49.9 (morbid obesity)  OBSTRUCTIVE SLEEP APNEA  Infected prosthetic mesh of abdominal wall with GIANT abscess s/p removal 08/14/2013 Acute renal insufficiency from fistula/wound loss-improved    LOS: 18 days   Plan: Continue TPN, bowel rest, Sandostatin, wound care.  This is a very long term process and this has been explained to him and his wife.   Kurt Cross 08/29/2013

## 2013-08-29 NOTE — Progress Notes (Signed)
[facility] PROGRESS NOTE  Kurt Cross [id number] DOB: Jan 30, 1951 DOA: 08/11/2013 PCP: [name], MD  Brief History 62 y.o. male with a remote history of bowel perforation requiring exploratory laparotomy and temporary colostomy. He then developed a ventral hernia and underwent mesh repair decades ago. He was recently hospitalized and treated for possible pneumonia. He had a fall at home recently and injured his right lower leg leading to readmission. He was noted to be febrile with some abdominal wall swelling. CT scan revealed a large abdominal wall abscess. 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  He was placed on bowel rest and started on TPN. Early in the admission, the patient was fluid overloaded and received intravenous furosemide. He has been since transitioned back to his home dose of oral furosemide and remains clinically stable from CHF standpoint.   Assessment/Plan: #1 infected prosthetic Gore-Tex mesh with a giant abdominal abscess status post IandD and Gore-Tex mesh removal 08/14/2013 now with enterocut fistula Patient is status post incision and drainage of abscess, Gore-Tex mesh removal, debridement of abdominal wall and peritoneum 08/14/2013.  -empiric IV Vancomycin and Zosyn.  -Blood cultures--neg  -now with EC fistula-->started on sandostatin by surgery 08/23/13 and dose increased.  -wound care and TNA per surgery  -abx per ID  -Continue long acting oxycontin 30mg  q 12hrs for pain control  -Patient was asking for OxyIR only 2-3 times per day--Dr Tat educated the patient that he may ask for up to 6 times per day if pain remains uncontrolled  -CT abdomen if WBC continues to rise  Continue sandostatin, TNA, supportiver care. Bowel rest. #2 sepsis  -secondary to problem #1.  -Wound cultures prelim with GPC in pairs and clusters-->Coag Neg Staph  -Remains hemodynamically  stable  Continue IV antibiotics. #3 hypotension  -resolved  -secondary to problem #2.  -Increased carvedilol to home dose  #4 atrial fibrillation/atrial flutter  -secondary to problem #1.  -currently rate controlled. Initial EKG showed flutter waves.  -Cardiac enzymes have been negative x3. 2-D echo with EF of 50-55%. -CHADSVASc score is 4 (not ASVD plaque on abd aorta).  -Continue Coreg for rate control.  -Eliquis was discontinued initially as patient went for emergent surgery secondary to problem #1.  -d/c [place] lovenox as pt now has blood from ventral wound -scheduled for TEE cardioversion however in light of problem #1 this was cancelled - F/u with cardiology 2-3 weeks post discharge.  #5 chronic kidney disease stage III  -Creatinine is trending back down. ACE inhibitor had been discontinued.  -Plan to restart ACE inhibitor if renal function remains stable  -Serum creatinine gradually increasing with decreased sodium--suspect increased insensible loss from the patient's large ventral wound -Normal saline started today by CCS--monitor fluid status closely with a history of CHF--may need to decrease furosemide  #6 leukocytosis/fever  -secondary to problem #1.  -resolved--Currently afebrile.  -WBC up a little but remains stable  -Chest x-[place] is negative for infiltrate. Patient with no respiratory symptoms.  -CT abdomen if WBC continues to rise  #7 chronic combined systolic and diastolic CHF  -Initial CXR with volume overload. given 4 doses of lasix 40mg  IV -Continue home dose furosemide 80 mg po twice a day  -decreased  carvedilol to 3.125mg  bid as BP was soft. Monitor closely with HR. -08/12/2013 echocardiogram shows EF 50-55%--EF improved compared to one year ago (35-40%)  #8 Uncontrolled  Pain -Continue long acting OxyContin to 30 mg twice a day -Continue OxyIR to 15 mg every 4 hours prn breakthrough pain #9 hyperlipidemia  Continue statin.  #10 4.5 cm ascending aortic aneurysm  per CT scan of 08/01/2013  Stable. Asymptomatic. Continue risk factor modification with good blood pressure control. Will need outpatient followup with vascular surgery as outpatient.  #11 type 2 diabetes  Hemoglobin A1c was 9.5 on 08/02/2013.Continue Lantus 65 units daily. -Sliding scale insulin.  -CBGs slowly creeping up with TNA  #12. Hypomagnesemia  -Repleted  #13 ?? Pauses Patient asymptomatic. If HR decreases will decrease coreg. Cardiology notified and ICD to be interrogated.   Family Communication: Updated patient, no family at beside  Disposition Plan: Home when cleared by surgery     Antibiotics Vancomycin 08/15/13>>>  Zosyn 08/14/13>>>               Procedures/Studies: Ct Abdomen Pelvis Wo Contrast  08/14/2013   CLINICAL DATA:  Fever, leukocytosis, prior hernia repairs and appendectomy  EXAM: CT ABDOMEN AND PELVIS WITHOUT CONTRAST  TECHNIQUE: Multidetector CT imaging of the abdomen and pelvis was performed following the standard protocol without intravenous contrast.  COMPARISON:  None.  FINDINGS: Note: Given body habitus, the entire anterior abdominal wall (including a portion of the abnormality) could not be included in the field of view despite two separate attempts.  Prior ventral hernia mesh repair. Just anterior to the mesh, within the anterior abdominal wall, is an abdominal wall fluid collection/abscess measuring approximately 14.5 x 18.1 x 26.1 cm (incompletely visualized). Along the superior aspect of the fluid collection, gas extends to immediately beneath the skin surface (series 10/image 27). Bowel loops are displaced posteriorly by the large collection fluid without definite communication.  A small hernia containing fat and a loop of nondilated colon is present inferiorly (sagittal image 88), to the left of midline (series 10/image 81 and 86).  Minimal left basilar atelectasis.  Cardiomegaly. Coronary atherosclerosis. ICD leads, incompletely visualized.   Moderate hepatic steatosis.  Unenhanced spleen and pancreas are unremarkable.  Low-density nodular thickening of the bilateral adrenal glands, likely reflecting adrenal adenomas.  Gallbladder is notable for layering gallstone. No intrahepatic or extrahepatic ductal dilatation.  Kidneys are unremarkable. No renal calculi or hydronephrosis.  No evidence of bowel obstruction. Prior appendectomy.  Atherosclerotic calcifications of the abdominal aorta and branch vessels.  No abdominopelvic ascites.  No suspicious abdominopelvic lymphadenopathy.  Prostate is unremarkable.  Bladder is within normal limits.  Degenerative changes of the visualized thoracolumbar spine.  IMPRESSION: 14.5 x 18.1 x 26.1 cm fluid collection/abscess within the anterior abdominal wall, as described above, incompletely visualized. The collection appears just anterior to prior ventral hernia mesh.  Given the size and proximity to the skin surface, incision and drainage is suggested.  These results were called by telephone at the time of interpretation on 08/14/2013 at 3:22 PM to Dr. Irine Seal , who verbally acknowledged these results.   Electronically Signed   By: Julian Hy M.D.   On: 08/14/2013 15:28   Dg Chest 2 View  08/01/2013   CLINICAL DATA:  Chest pain, shortness of breath.  EXAM: CHEST  2 VIEW  COMPARISON:  August 24, 2012.  FINDINGS: Stable cardiomegaly. Left-sided pacemaker is noted. No pleural effusion or pneumothorax is noted. Mild central pulmonary vascular congestion is noted which appears to be chronic. No acute pulmonary disease is noted.  IMPRESSION: No active cardiopulmonary disease.   Electronically Signed   By: Dionne Ano.D.  On: 08/01/2013 17:06   Dg Hip Complete Right  08/11/2013   CLINICAL DATA:  Right posterior hip pain.  EXAM: RIGHT HIP - COMPLETE 2+ VIEW  COMPARISON:  None.  FINDINGS: There is no evidence of hip fracture or dislocation. There is no evidence of arthropathy or other focal bone  abnormality.  IMPRESSION: No acute osseous injury of the right hip.   Electronically Signed   By: Kathreen Devoid   On: 08/11/2013 15:16   Ct Angio Chest Pe W/cm &/or Wo Cm  08/01/2013   CLINICAL DATA:  Shortness of Breath,  EXAM: CT ANGIOGRAPHY CHEST WITH CONTRAST  TECHNIQUE: Multidetector CT imaging of the chest was performed using the standard protocol during bolus administration of intravenous contrast. Multiplanar CT image reconstructions including MIPs were obtained to evaluate the vascular anatomy.  CONTRAST:  144mL OMNIPAQUE IOHEXOL 350 MG/ML SOLN  COMPARISON:  06/28/2012  FINDINGS: There is fairly good contrast opacification of the pulmonary artery branches with no convincing filling defects to suggest acute PE. There is good contrast opacification of the thoracic aorta with some scattered plaque, no evidence of dissection or stenosis. There is dilatation of the ascending aorta measuring up to 4.5 cm transverse diameter, stable by my measurement since prior study. Bovine brachiocephalic arterial origin anatomy without proximal stenosis. No pleural or pericardial effusion. No hilar or mediastinal adenopathy. Transvenous pacing leads are noted. Minimal linear scarring or subsegmental atelectasis in both lower lobes. Lungs otherwise clear. Visualized portions of upper abdomen unremarkable. Multiple bridging osteophytes throughout the mid and lower thoracic spine. Sternum intact.  Review of the MIP images confirms the above findings.  IMPRESSION: 1. 4.5 cm ascending aortic aneurysm without complicating features. 2. No evidence of acute pulmonary embolism or aortic dissection.   Electronically Signed   By: Arne Cleveland M.D.   On: 08/01/2013 19:55   Dg Chest Port 1 View  08/18/2013   CLINICAL DATA:  Confirm line placement  EXAM: PORTABLE CHEST - 1 VIEW  COMPARISON:  08/17/2013  FINDINGS: Two images were taken. The patient is in semi-upright position and is slightly rotated to the right. A right upper  extremity PICC has been placed, the distal tip of which is in the mid superior vena cava. Left chest wall triple lead pacemaker appears stable. Cardiomegaly. There is diffuse pulmonary vascular congestion and similar appearance of pulmonary edema. No pneumothorax or pleural effusion is appreciated.  IMPRESSION: 1. Right upper extremity PICC terminates in the mid superior vena cava. 2. Stable congestive heart failure pattern.   Electronically Signed   By: Curlene Dolphin M.D.   On: 08/18/2013 21:39   Dg Chest Port 1 View  08/17/2013   CLINICAL DATA:  Respiratory failure  EXAM: PORTABLE CHEST - 1 VIEW  COMPARISON:  08/15/2013.  FINDINGS: Persistent cardiomegaly with pulmonary vascular prominence and interstitial prominence suggesting congestive heart failure with interstitial edema noted. Interstitial pneumonitis cannot be excluded. Cardiac pacer. Lead tips in stable position. No pneumothorax. No acute osseous abnormality.  IMPRESSION: Stable chest with changes of congestive heart failure with pulmonary interstitial edema. Cardiac pacer in stable position.   Electronically Signed   By: Marcello Moores  Register   On: 08/17/2013 07:41   Dg Chest Portable 1 View  08/15/2013   CLINICAL DATA:  Sepsis, respiratory distress  EXAM: PORTABLE CHEST - 1 VIEW  COMPARISON:  Prior radiograph from 08/13/2013  FINDINGS: Left-sided pacemaker is unchanged. Cardiomegaly is stable as compared to the prior exam.  Lungs are normally inflated. There has been  worsening of pulmonary vascular congestion and edema as compared to the prior examination. No definite pleural effusion. No focal infiltrates appreciated. No pneumothorax.  Osseous structures are unchanged.  IMPRESSION: Stable cardiomegaly with interval worsening of pulmonary vascular congestion/ edema relative to 08/13/2013.   Electronically Signed   By: Jeannine Boga M.D.   On: 08/15/2013 01:16   Dg Chest Port 1 View  08/13/2013   CLINICAL DATA:  Leukocytosis.  EXAM: PORTABLE  CHEST - 1 VIEW  COMPARISON:  08/01/2013  FINDINGS: No focal pulmonary consolidation is identified. There is stable mild pulmonary venous prominence without overt edema or visualized pleural fluid. The heart remains mildly enlarged. There is stable appearance of a biventricular pacemaker.  IMPRESSION: No acute findings. Stable cardiomegaly and pulmonary venous prominence without overt edema.   Electronically Signed   By: Aletta Edouard M.D.   On: 08/13/2013 15:44    Procedures:  2-D echo 08/12/2013  X-Missey of the right hip 08/11/2013  CT abdomen and pelvis pending  Incision and drainage of abscess, Gore-Tex mesh removal, debridement of abdominal wall and peritoneum   Consultants:  Cardiology: Dr. Terrence Dupont 08/13/2013  General surgery: Dr. Johney Maine 08/14/2013  ID: Dr Megan Salon 08/15/13      Subjective: Patient denies abdominal pain. No complaints.  Objective: Filed Vitals:   08/28/13 0515 08/28/13 1409 08/28/13 2020 08/29/13 0448  BP: 132/73 129/66 115/65 130/62  Pulse: 68 63 65 60  Temp: 98.4 F (36.9 C) 98.8 F (37.1 C) 98.2 F (36.8 C) 98.3 F (36.8 C)  TempSrc: Oral Axillary Oral Oral  Resp: 20 16 18 20   Height:      Weight:   150.4 kg (331 lb 9.2 oz) 151 kg (332 lb 14.3 oz)  SpO2: 94% 98% 95% 100%    Intake/Output Summary (Last 24 hours) at 08/29/13 1350 Last data filed at 08/29/13 1100  Gross per 24 hour  Intake   4999 ml  Output   3800 ml  Net   1199 ml   Weight change: 0 kg (0 lb) Exam:   General:  Pt is alert, follows commands appropriately, not in acute distress  HEENT: No icterus, No thrush,  [place]/AT  Cardiovascular: RRR, S1/S2, no rubs, no gallops  Respiratory: CTA bilaterally, no wheezing, no crackles, no rhonchi Abdomen: Soft/+BS, non tender, non distended, no guarding; large ventral open wound with ostomy bag; no erythema--no necrosis, no crepitance    Extremities: trace LE edema, No lymphangitis, No petechiae, No rashes, no synovitis  Data  Reviewed: Basic Metabolic Panel:  Recent Labs Lab 08/23/13 0548 08/24/13 0400  08/25/13 0750 08/26/13 0517 08/27/13 0550 08/28/13 0420 08/29/13 0500  NA 139  --   < > 133* 137 132* 134* 135  K 3.5  --   < > 3.5 3.3* 3.4* 3.2* 3.4*  CL 97  --   < > 93* 95* 95* 94* 95*  CO2 34*  --   < > 31 32 32 31 33*  GLUCOSE 138*  --   < > 93 98 122* 96 86  BUN 20  --   < > 27* 28* 30* 31* 30*  CREATININE 1.13  --   < > 1.29 1.31 1.37* 1.34 1.35  CALCIUM 9.4  --   < > 9.2 9.1 8.7 8.9 8.9  MG 1.7 1.8  --   --  1.8  --   --   --   PHOS 4.5  --   --   --  5.0* 3.9 3.6  --   < > =  values in this interval not displayed. Liver Function Tests:  Recent Labs Lab 08/23/13 0548 08/26/13 0517  AST 17 14  ALT 12 10  ALKPHOS 29* 29*  BILITOT 0.4 0.5  PROT 7.0 7.1  ALBUMIN 2.1* 2.1*   No results found for this basename: LIPASE, AMYLASE,  in the last 168 hours No results found for this basename: AMMONIA,  in the last 168 hours CBC:  Recent Labs Lab 08/23/13 0548  08/25/13 0750 08/26/13 0517 08/27/13 0550 08/28/13 0420 08/29/13 0500  WBC 12.0*  < > 11.2* 10.1 9.8 9.2 8.5  NEUTROABS 8.7*  --   --   --   --   --   --   HGB 12.1*  < > 12.0* 12.1* 11.5* 11.9* 11.7*  HCT 36.2*  < > 36.2* 35.9* 34.5* 36.0* 33.9*  MCV 96.0  < > 96.0 96.2 96.1 95.2 95.0  PLT 340  < > 326 300 276 280 266  < > = values in this interval not displayed. Cardiac Enzymes: No results found for this basename: CKTOTAL, CKMB, CKMBINDEX, TROPONINI,  in the last 168 hours BNP: No components found with this basename: POCBNP,  CBG:  Recent Labs Lab 08/28/13 2015 08/29/13 0028 08/29/13 0427 08/29/13 0727 08/29/13 1124  GLUCAP 112* 97 93 94 110*    No results found for this or any previous visit (from the past 240 hour(s)).   Scheduled Meds: . acetaminophen  1,000 mg Oral TID  . antiseptic oral rinse  15 mL Mouth Rinse q12n4p  . atorvastatin  40 mg Oral QODAY  . carvedilol  3.125 mg Oral BID WC  . chlorhexidine   15 mL Mouth Rinse BID  . furosemide  80 mg Oral BID  . insulin aspart  0-20 Units Subcutaneous Q4H  . insulin glargine  65 Units Subcutaneous QHS  . lip balm  1 application Topical BID  . octreotide  150 mcg Subcutaneous TID  . OxyCODONE  30 mg Oral Q12H  . piperacillin-tazobactam (ZOSYN)  IV  3.375 g Intravenous Q8H  . potassium chloride  40 mEq Oral Daily  . psyllium  1 packet Oral Daily  . saccharomyces boulardii  250 mg Oral BID  . sodium chloride  3 mL Intravenous Q12H  . vancomycin  2,000 mg Intravenous Q24H   Continuous Infusions: . sodium chloride 50 mL/hr at 08/28/13 1640  . [name] KitchenTPN (CLINIMIX-E) Adult 125 mL/hr at 08/28/13 1758   And  . fat emulsion 240 mL (08/28/13 1759)  . [name] KitchenTPN (CLINIMIX-E) Adult     And  . fat emulsion       Etha Stambaugh, MD  [facility] Pager [phone]  If 7PM-7AM, please contact night-coverage [email or web address] Password [email or web address] 08/29/2013, 1:50 PM   LOS: 18 days

## 2013-08-30 DIAGNOSIS — E46 Unspecified protein-calorie malnutrition: Secondary | ICD-10-CM

## 2013-08-30 LAB — COMPREHENSIVE METABOLIC PANEL
ALT: 9 U/L (ref 0–53)
AST: 17 U/L (ref 0–37)
Alkaline Phosphatase: 40 U/L (ref 39–117)
BUN: 37 mg/dL — ABNORMAL HIGH (ref 6–23)
CO2: 32 mEq/L (ref 19–32)
Chloride: 96 mEq/L (ref 96–112)
GFR calc non Af Amer: 44 mL/min — ABNORMAL LOW (ref >90)
Potassium: 3.4 mEq/L — ABNORMAL LOW (ref 3.5–5.1)
Sodium: 137 mEq/L (ref 135–145)
Total Bilirubin: 0.6 mg/dL (ref 0.3–1.2)

## 2013-08-30 LAB — CBC
HCT: 33.8 % — ABNORMAL LOW (ref 39.0–52.0)
Hemoglobin: 11.4 g/dL — ABNORMAL LOW (ref 13.0–17.0)
MCH: 31.9 pg (ref 26.0–34.0)
MCHC: 33.7 g/dL (ref 30.0–36.0)
MCV: 94.7 fL (ref 78.0–100.0)
RBC: 3.57 MIL/uL — ABNORMAL LOW (ref 4.22–5.81)
RDW: 13.8 % (ref 11.5–15.5)
WBC: 7.6 10*3/uL (ref 4.0–10.5)

## 2013-08-30 LAB — GLUCOSE, CAPILLARY
Glucose-Capillary: 108 mg/dL — ABNORMAL HIGH (ref 70–99)
Glucose-Capillary: 121 mg/dL — ABNORMAL HIGH (ref 70–99)
Glucose-Capillary: 130 mg/dL — ABNORMAL HIGH (ref 70–99)
Glucose-Capillary: 143 mg/dL — ABNORMAL HIGH (ref 70–99)

## 2013-08-30 LAB — DIFFERENTIAL
Basophils Absolute: 0 10*3/uL (ref 0.0–0.1)
Basophils Relative: 0 % (ref 0–1)
Eosinophils Relative: 7 % — ABNORMAL HIGH (ref 0–5)
Lymphocytes Relative: 23 % (ref 12–46)
Monocytes Absolute: 1.2 10*3/uL — ABNORMAL HIGH (ref 0.1–1.0)
Monocytes Relative: 16 % — ABNORMAL HIGH (ref 3–12)

## 2013-08-30 LAB — TRIGLYCERIDES: Triglycerides: 99 mg/dL (ref <150)

## 2013-08-30 MED ORDER — FUROSEMIDE 40 MG PO TABS
40.0000 mg | ORAL_TABLET | Freq: Two times a day (BID) | ORAL | Status: DC
  Administered 2013-08-31: 08:00:00 40 mg via ORAL
  Filled 2013-08-30 (×4): qty 1

## 2013-08-30 MED ORDER — POTASSIUM CHLORIDE 10 MEQ/100ML IV SOLN
10.0000 meq | INTRAVENOUS | Status: AC
  Administered 2013-08-30 (×3): 10 meq via INTRAVENOUS
  Filled 2013-08-30 (×3): qty 100

## 2013-08-30 MED ORDER — FAT EMULSION 20 % IV EMUL
250.0000 mL | INTRAVENOUS | Status: AC
  Administered 2013-08-30: 250 mL via INTRAVENOUS
  Filled 2013-08-30: qty 250

## 2013-08-30 MED ORDER — TRACE MINERALS CR-CU-F-FE-I-MN-MO-SE-ZN IV SOLN
INTRAVENOUS | Status: AC
  Administered 2013-08-30: 18:00:00 via INTRAVENOUS
  Filled 2013-08-30: qty 3000

## 2013-08-30 NOTE — Consult Note (Signed)
WOC follow-up: Eakin pouch has been in place 4 days, not currently leaking but seal is beginning to soften on wound edges.  Suction was not working upon entering the room since red rubber catheter was clogged. Removed Eakin pouch and sheets of Mepitel.  Cleansed wound with moist gauze.  Wound edge remains with 7 cm undermining to 5:00 o'clock area.  Wound bed beefy red.  Large amt thick green drainage.  Applied one sheet of Mepitel over wound bed and large Eakin pouch over wound.  Red rubber catheter attached to medium wall suction and inside lower rim of pouch.  Pt can remove suction when OOB to ambulate.  He appears well-informed regarding the pouch application, need to flush catheter if it is not suctioning, and application and removal of wall suction.  Supplies at bedside for staff use PRN if pouch leaks.  Emptied 100cc liquid brown stool from drainage spout on pouch. Julien Girt MSN, RN, [place], [place], [place]

## 2013-08-30 NOTE — Progress Notes (Signed)
CSW continues to follow. Patient still on TPN and wall suctioning for wound and CSW continues to have no one who can handle this.  Charmin Aguiniga C. Waverly MSW, [place]

## 2013-08-30 NOTE — Progress Notes (Signed)
Patient ID: Kurt Cross, male   DOB: 05/17/1951, 62 y.o.   MRN: [id number] 16 Days Post-Op  Subjective: Pt feels ok today.  Not much pain.  eakin's pouch changed today per WOC, RN.  Output has not decreased at this time per patient, although I&O documentation shows decrease yesterday from Saturday.  Objective: Vital signs in last 24 hours: Temp:  [97.3 F (36.3 C)-98.5 F (36.9 C)] 97.3 F (36.3 C) (11/24 0445) Pulse Rate:  [60-61] 60 (11/24 0445) Resp:  [16-20] 19 (11/24 0755) BP: (123-135)/(56-72) 135/72 mmHg (11/24 0445) SpO2:  [98 %-100 %] 100 % (11/24 0445) Weight:  [327 lb 2.6 oz (148.4 kg)] 327 lb 2.6 oz (148.4 kg) (11/24 0445) Last BM Date: 08/24/13  Intake/Output from previous day: 11/23 0701 - 11/24 0700 In: 0  Out: 2075 [Urine:1875; Drains:200] Intake/Output this shift: Total I/O In: 29 [IV Piggyback:50] Out: 275 [Urine:275]  PE: Abd: soft, appropriately tender, Eakin's pouch in place with Mepitel covering wound.  Red rubber cathter in place with enteric contents being sucked into cannister.    Lab Results:   Recent Labs  08/29/13 0500 08/30/13 0500  WBC 8.5 7.6  HGB 11.7* 11.4*  HCT 33.9* 33.8*  PLT 266 264   BMET  Recent Labs  08/29/13 0500 08/30/13 0500  NA 135 137  K 3.4* 3.4*  CL 95* 96  CO2 33* 32  GLUCOSE 86 94  BUN 30* 37*  CREATININE 1.35 1.63*  CALCIUM 8.9 8.8   PT/INR No results found for this basename: LABPROT, INR,  in the last 72 hours CMP     Component Value Date/Time   NA 137 08/30/2013 0500   K 3.4* 08/30/2013 0500   CL 96 08/30/2013 0500   CO2 32 08/30/2013 0500   GLUCOSE 94 08/30/2013 0500   BUN 37* 08/30/2013 0500   CREATININE 1.63* 08/30/2013 0500   CALCIUM 8.8 08/30/2013 0500   PROT 7.1 08/30/2013 0500   ALBUMIN 2.0* 08/30/2013 0500   AST 17 08/30/2013 0500   ALT 9 08/30/2013 0500   ALKPHOS 40 08/30/2013 0500   BILITOT 0.6 08/30/2013 0500   GFRNONAA 44* 08/30/2013 0500   GFRAA 51* 08/30/2013 0500   Lipase   No results found for this basename: lipase       Studies/Results: No results found.  Anti-infectives: Anti-infectives   Start     Dose/Rate Route Frequency Ordered Stop   08/16/13 0800  vancomycin (VANCOCIN) 2,000 mg in sodium chloride 0.9 % 500 mL IVPB     2,000 mg 250 mL/hr over 120 Minutes Intravenous Every 24 hours 08/15/13 1411     08/15/13 1500  vancomycin (VANCOCIN) 2,000 mg in sodium chloride 0.9 % 500 mL IVPB     2,000 mg 250 mL/hr over 120 Minutes Intravenous  Once 08/15/13 1411 08/15/13 1713   08/14/13 1600  piperacillin-tazobactam (ZOSYN) IVPB 3.375 g     3.375 g 12.5 mL/hr over 240 Minutes Intravenous Every 8 hours 08/14/13 1543         Assessment/Plan   1. Giant abdominal abscess with infected old GoreTex mesh POD #16 s/p I&D abscess, removal of mesh, debridement of abdominal wall & peritoneum  2. Multiple enterocutaneous fistulas 3. PCM/TNA 4. Multiple medical problems  Plan: 1. Patient's creatinine up today.  Suspect this is secondary to high output from drainage, although drainage seems to have gone down some yesterday.  May need some more fluids to keep balance and creatinine from continuing to rise. 2. Cont TNA  while NPO for full nutritional support while unable to take in oral nutrients. 3. Cont Sandostatin and follow output trend. 4. Once patient stabilizes with EC fistula output and can maintain hydration etc, then hopefully he can go home on TNA if fistulas still present.  This may be some time though.  LOS: 19 days    Sheila Gervasi E 08/30/2013, 9:44 AM Pager: [phone]

## 2013-08-30 NOTE — Progress Notes (Signed)
ANTIBIOTIC CONSULT NOTE   Pharmacy Consult for Zosyn/Vancomycin Indication: intra-abdominal abscess  No Known Allergies  Patient Measurements: Height: 6' (182.9 cm) Weight: 327 lb 2.6 oz (148.4 kg) IBW/kg (Calculated) : 77.6  Vital Signs: Temp: 97.3 F (36.3 C) (11/24 0445) Temp src: Oral (11/24 0445) BP: 135/72 mmHg (11/24 0445) Pulse Rate: 60 (11/24 0445)  Labs:  Recent Labs  08/28/13 0420 08/29/13 0500 08/30/13 0500  WBC 9.2 8.5 7.6  HGB 11.9* 11.7* 11.4*  PLT 280 266 264  CREATININE 1.34 1.35 1.63*   Vancomycin trough 08/18/13:  12.3 mcg/ml on 2g q24h Vancomycin trough 08/24/13:  13.9 mcg/ml on 2g q24h Vancomycin trough 08/27/13:  14.5 mcg/ml on 2g q24h  Antibiotics and culture results: 11/8 >> Zosyn >>  11/9 >> Vancomycin>> ==================== 11/5 urine >> NGF 11/7 urine >> NGF 11/7 blood x2>> NGTF 11/8: abdomen incision drainage: rare coag neg staph, no anaerobes 11/9: C. Diff: negative 11/9: blood x 2: NGF 11/9: urine: NGF   Assessment: 62 y/o M presented with fluid collection / abscess within anterior abdominal wall.  On 08/14/13 underwent I&D with removal of infected prosthetic Gore-tex mesh and evacuation of 3L purulent fluid.    D#17 Zosyn 3.375 grams IV q8h   D#16 Vancomycin 2 grams IV q24h of planned 2-3 weeks per ID  Afebrile, WBC remains wnl.  SCr 1.63, continues to slowly increase  Vancomycin trough slightly subtherapeutic on 11/21, but with rising SCr, will continue same dose as further accumulation expected.  Recheck VT as needed.  Goal: Vancomycin trough 15-20 mcg/ml for abscess Appropriate abx dosing, eradication of infection.   Plan:   Continue Vancomycin 2 gm IV q24h  Continue Zosyn 3.375 gm IV q8h (extended infusion, each dose over 4 hours)  Monitor SCr closely and adjust doses as needed   Gretta Arab PharmD, BCPS Pager [phone] 08/30/2013 8:35 AM

## 2013-08-30 NOTE — Progress Notes (Signed)
PARENTERAL NUTRITION CONSULT NOTE - FOLLOW UP  Pharmacy Consult for TNA Indication: Enterocutaneous fistula  No Known Allergies  Patient Measurements: Height: 6' (182.9 cm) Weight: 327 lb 2.6 oz (148.4 kg) IBW/kg (Calculated) : 77.6 Adjusted Body Weight: 102.6kg  Vital Signs: Temp: 97.3 F (36.3 C) (11/24 0445) Temp src: Oral (11/24 0445) BP: 135/72 mmHg (11/24 0445) Pulse Rate: 60 (11/24 0445) Intake/Output from previous day: 11/23 0701 - 11/24 0700 In: 0  Out: 2075 [Urine:1875; Drains:200]  Labs:  Recent Labs  08/28/13 0420 08/29/13 0500 08/30/13 0500  WBC 9.2 8.5 7.6  HGB 11.9* 11.7* 11.4*  HCT 36.0* 33.9* 33.8*  PLT 280 266 264     Recent Labs  08/28/13 0420 08/29/13 0500 08/30/13 0500 08/30/13 0502  NA 134* 135 137  --   K 3.2* 3.4* 3.4*  --   CL 94* 95* 96  --   CO2 31 33* 32  --   GLUCOSE 96 86 94  --   BUN 31* 30* 37*  --   CREATININE 1.34 1.35 1.63*  --   CALCIUM 8.9 8.9 8.8  --   MG  --   --  1.9  --   PHOS 3.6  --  5.4*  --   PROT  --   --  7.1  --   ALBUMIN  --   --  2.0*  --   AST  --   --  17  --   ALT  --   --  9  --   ALKPHOS  --   --  40  --   BILITOT  --   --  0.6  --   TRIG  --   --   --  99   Estimated Creatinine Clearance: 72.2 ml/min (by C-G formula based on Cr of 1.63).    Recent Labs  08/29/13 2003 08/30/13 0028 08/30/13 0432  GLUCAP 142* 143* 108*    Medications:  Infusions:  . sodium chloride 50 mL/hr at 08/28/13 1640  . [name] KitchenTPN (CLINIMIX-E) Adult 125 mL/hr at 08/29/13 1741   And  . fat emulsion 240 mL (08/29/13 1740)    Insulin Requirements in the past 24 hours:   Lantus 65units sq qHS   6 units SSI required  - CBG q4h with resistant scale coverage  50 units regular insulin added to TNA 11/16  Nutritional Goals:   Per RD 11/13:  2400-2700 kcal/day, 160-180 grams/day protein, 3.3-3.5 L/day fluid  Clinimix E5/15 at goal rate 125 ml/hr (3L/day) with 20% lipids at 10 ml/hr to provide 150g protein and 2675  kcal daily.  Due to how much fluid can be placed in TNA bag, Max volume is 3L.   Current Nutrition:   NPO  Clinimix 5/15 at 125 ml/hr and 20% lipids at 67ml/hr   IVF: NS at 55ml/hr  Assessment: 62 yo male with large abdominal abscess with infected old Gore Tex mesh s/p I&D abscess, removal of mesh, debridement of abdominal wall and peritoneum. 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 TNA started 11/12 at 10pm.   Glucose: Hx DM on insulin PTA, CBG improved with addition of insulin to TNA 11/16 and increased Lantus doses.  CBGs and serum glucose at goal, and OK with decreased insulin in TNA bag on 11/23.  Electrolytes: Na wnl, K low at 3.4, despite daily replacement.  Phos now elevated at 5.4 - will remove electrolytes from TNA (previously high and improved after removing electrolytes from TNA x 1 day on  11/20)    SCr: increased to 1.63  LFTs: wnl, albumin remains low (2)  TGs: wnl, 124 (11/13), 89 (11/17), 99 (11/24)  Prealbumin: low, 10.3 (11/13), 11.1 (11/17), pending (11/24)  Monitor fluid status very closely since TNA at high goal rate.  Following I/O but unsure of accuracy with NO intake documented yesterday.  Weight is not increasing. Octreotide started 11/17 to control fistula output and output had been decreasing.  Drain output recorded as only 200 mL out on 11/23.  TPN Access: Triple lumen PICC placed 11/12 TPN day#: 13  Plan:   Remove electrolytes from TNA - Clinimix 5/15 at goal rate of 129ml/hr.  Continue 20% fat emulsion at 67ml/hr.  Continue insulin 50 units per 3L bag.  Continue Lantus 65 units qhs  KCl 64mEq IV x 3 runs (in addition to KCl 33mEq PO daily per MD)  TNA to contain standard multivitamins and trace elements.  Continue IVF per MD - NS at 30ml/hr  TNA lab panels on Mondays & Thursdays.   BMET and Phos level with AM labs  F/u clinical progress, ability to start diet and wean TNA   60 Gretta Arab PharmD, BCPS Pager  [phone] 08/30/2013 8:05 AM

## 2013-08-30 NOTE — Progress Notes (Signed)
[facility] PROGRESS NOTE  Kurt Cross [id number] DOB: [birth year] DOA: 08/11/2013 PCP: [name], MD  Brief History 62 y.o. male with a remote history of bowel perforation requiring exploratory laparotomy and temporary colostomy. He then developed a ventral hernia and underwent mesh repair decades ago. He was recently hospitalized and treated for possible pneumonia. He had a fall at home recently and injured his right lower leg leading to readmission. He was noted to be febrile with some abdominal wall swelling. CT scan revealed a large abdominal wall abscess. 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  He was placed on bowel rest and started on TPN. Early in the admission, the patient was fluid overloaded and received intravenous furosemide. He has been since transitioned back to his home dose of oral furosemide and remains clinically stable from CHF standpoint.   Assessment/Plan: #1 infected prosthetic Gore-Tex mesh with a giant abdominal abscess status post IandD and Gore-Tex mesh removal 08/14/2013 now with enterocut fistula Patient is status post incision and drainage of abscess, Gore-Tex mesh removal, debridement of abdominal wall and peritoneum 08/14/2013.  -empiric IV Vancomycin and Zosyn.  -Blood cultures--neg  -now with EC fistula-->started on sandostatin by surgery 08/23/13 and dose increased.  -wound care and TNA per surgery  -abx per ID  -Continue long acting oxycontin 15mg  q 12hrs for pain control  -Patient was asking for OxyIR for breakthrough pain. -CT abdomen if WBC continues to rise  Continue sandostatin, TNA, supportiver care. Bowel rest. #2 sepsis  -secondary to problem #1.  -Wound cultures prelim with GPC in pairs and clusters-->Coag Neg Staph  -Remains hemodynamically stable  Continue IV antibiotics. #3 hypotension  -resolved  -secondary to problem #2.   -Increased carvedilol to home dose  #4 atrial fibrillation/atrial flutter  -secondary to problem #1.  -currently rate controlled. Initial EKG showed flutter waves.  -Cardiac enzymes have been negative x3. 2-D echo with EF of 50-55%. -CHADSVASc score is 4 (not ASVD plaque on abd aorta).  -Continue Coreg for rate control.  -Eliquis was discontinued initially as patient went for emergent surgery secondary to problem #1.  -d/c [place] lovenox as pt now has blood from ventral wound -scheduled for TEE cardioversion however in light of problem #1 this was cancelled - F/u with cardiology 2-3 weeks post discharge.  Will resume eliquis tomorrow morning as discussed with general surgery. #5 chronic kidney disease stage III  -Creatinine is trending back down. ACE inhibitor had been discontinued.  -Plan to restart ACE inhibitor if renal function remains stable  -Serum creatinine gradually increasing with decreased sodium--suspect increased insensible loss from the patient's large ventral wound -Normal saline started by CCS--monitor fluid status closely with a history of CHF--we'll decrease Lasix to half home dose of 40 mg twice daily. As creatinine is slightly increasing. #6 leukocytosis/fever  -secondary to problem #1.  -resolved--Currently afebrile.  -WBC up a little but remains stable  -Chest x-Ivy is negative for infiltrate. Patient with no respiratory symptoms.  -CT abdomen if WBC continues to rise  #7 chronic combined systolic and diastolic CHF  -Initial CXR with volume overload. given 4 doses of lasix 40mg  IV -Decrease home dose furosemide to half home dose of 40 mg  po twice a day secondary to slight rising creatinine. -decreased  carvedilol to 3.125mg  bid as BP was soft. Monitor closely with HR. -08/12/2013 echocardiogram shows EF 50-55%--EF improved compared  to one year ago (35-40%)  #8 Uncontrolled Pain -Continue long acting OxyContin to 15 mg twice a day -Continue OxyIR to 15 mg every 4  hours prn breakthrough pain #9 hyperlipidemia  Continue statin.  #10 4.5 cm ascending aortic aneurysm per CT scan of 08/01/2013  Stable. Asymptomatic. Continue risk factor modification with good blood pressure control. Will need outpatient followup with vascular surgery as outpatient.  #11 type 2 diabetes  Hemoglobin A1c was 9.5 on 08/02/2013.Continue Lantus 65 units daily. -Sliding scale insulin.  -CBGs slowly creeping up with TNA  #12. Hypomagnesemia  -Repleted  #13 ?? Pauses Patient asymptomatic. If HR decreases will decrease coreg. Cardiology notified and ICD has been interrogated.   Family Communication: Updated patient, no family at beside  Disposition Plan: Home when cleared by surgery     Antibiotics Vancomycin 08/15/13>>>  Zosyn 08/14/13>>>               Procedures/Studies: Ct Abdomen Pelvis Wo Contrast  08/14/2013   CLINICAL DATA:  Fever, leukocytosis, prior hernia repairs and appendectomy  EXAM: CT ABDOMEN AND PELVIS WITHOUT CONTRAST  TECHNIQUE: Multidetector CT imaging of the abdomen and pelvis was performed following the standard protocol without intravenous contrast.  COMPARISON:  None.  FINDINGS: Note: Given body habitus, the entire anterior abdominal wall (including a portion of the abnormality) could not be included in the field of view despite two separate attempts.  Prior ventral hernia mesh repair. Just anterior to the mesh, within the anterior abdominal wall, is an abdominal wall fluid collection/abscess measuring approximately 14.5 x 18.1 x 26.1 cm (incompletely visualized). Along the superior aspect of the fluid collection, gas extends to immediately beneath the skin surface (series 10/image 27). Bowel loops are displaced posteriorly by the large collection fluid without definite communication.  A small hernia containing fat and a loop of nondilated colon is present inferiorly (sagittal image 88), to the left of midline (series 10/image 81 and 86).  Minimal  left basilar atelectasis.  Cardiomegaly. Coronary atherosclerosis. ICD leads, incompletely visualized.  Moderate hepatic steatosis.  Unenhanced spleen and pancreas are unremarkable.  Low-density nodular thickening of the bilateral adrenal glands, likely reflecting adrenal adenomas.  Gallbladder is notable for layering gallstone. No intrahepatic or extrahepatic ductal dilatation.  Kidneys are unremarkable. No renal calculi or hydronephrosis.  No evidence of bowel obstruction. Prior appendectomy.  Atherosclerotic calcifications of the abdominal aorta and branch vessels.  No abdominopelvic ascites.  No suspicious abdominopelvic lymphadenopathy.  Prostate is unremarkable.  Bladder is within normal limits.  Degenerative changes of the visualized thoracolumbar spine.  IMPRESSION: 14.5 x 18.1 x 26.1 cm fluid collection/abscess within the anterior abdominal wall, as described above, incompletely visualized. The collection appears just anterior to prior ventral hernia mesh.  Given the size and proximity to the skin surface, incision and drainage is suggested.  These results were called by telephone at the time of interpretation on 08/14/2013 at 3:22 PM to Dr. Irine Seal , who verbally acknowledged these results.   Electronically Signed   By: Julian Hy M.D.   On: 08/14/2013 15:28   Dg Chest 2 View  08/01/2013   CLINICAL DATA:  Chest pain, shortness of breath.  EXAM: CHEST  2 VIEW  COMPARISON:  August 24, 2012.  FINDINGS: Stable cardiomegaly. Left-sided pacemaker is noted. No pleural effusion or pneumothorax is noted. Mild central pulmonary vascular congestion is noted which appears to be chronic. No acute pulmonary disease is noted.  IMPRESSION: No active cardiopulmonary disease.   Electronically Signed  By: Sabino Dick M.D.   On: 08/01/2013 17:06   Dg Hip Complete Right  08/11/2013   CLINICAL DATA:  Right posterior hip pain.  EXAM: RIGHT HIP - COMPLETE 2+ VIEW  COMPARISON:  None.  FINDINGS: There is no  evidence of hip fracture or dislocation. There is no evidence of arthropathy or other focal bone abnormality.  IMPRESSION: No acute osseous injury of the right hip.   Electronically Signed   By: Kathreen Devoid   On: 08/11/2013 15:16   Ct Angio Chest Pe W/cm &/or Wo Cm  08/01/2013   CLINICAL DATA:  Shortness of Breath,  EXAM: CT ANGIOGRAPHY CHEST WITH CONTRAST  TECHNIQUE: Multidetector CT imaging of the chest was performed using the standard protocol during bolus administration of intravenous contrast. Multiplanar CT image reconstructions including MIPs were obtained to evaluate the vascular anatomy.  CONTRAST:  143mL OMNIPAQUE IOHEXOL 350 MG/ML SOLN  COMPARISON:  06/28/2012  FINDINGS: There is fairly good contrast opacification of the pulmonary artery branches with no convincing filling defects to suggest acute PE. There is good contrast opacification of the thoracic aorta with some scattered plaque, no evidence of dissection or stenosis. There is dilatation of the ascending aorta measuring up to 4.5 cm transverse diameter, stable by my measurement since prior study. Bovine brachiocephalic arterial origin anatomy without proximal stenosis. No pleural or pericardial effusion. No hilar or mediastinal adenopathy. Transvenous pacing leads are noted. Minimal linear scarring or subsegmental atelectasis in both lower lobes. Lungs otherwise clear. Visualized portions of upper abdomen unremarkable. Multiple bridging osteophytes throughout the mid and lower thoracic spine. Sternum intact.  Review of the MIP images confirms the above findings.  IMPRESSION: 1. 4.5 cm ascending aortic aneurysm without complicating features. 2. No evidence of acute pulmonary embolism or aortic dissection.   Electronically Signed   By: Arne Cleveland M.D.   On: 08/01/2013 19:55   Dg Chest Port 1 View  08/18/2013   CLINICAL DATA:  Confirm line placement  EXAM: PORTABLE CHEST - 1 VIEW  COMPARISON:  08/17/2013  FINDINGS: Two images were taken.  The patient is in semi-upright position and is slightly rotated to the right. A right upper extremity PICC has been placed, the distal tip of which is in the mid superior vena cava. Left chest wall triple lead pacemaker appears stable. Cardiomegaly. There is diffuse pulmonary vascular congestion and similar appearance of pulmonary edema. No pneumothorax or pleural effusion is appreciated.  IMPRESSION: 1. Right upper extremity PICC terminates in the mid superior vena cava. 2. Stable congestive heart failure pattern.   Electronically Signed   By: Curlene Dolphin M.D.   On: 08/18/2013 21:39   Dg Chest Port 1 View  08/17/2013   CLINICAL DATA:  Respiratory failure  EXAM: PORTABLE CHEST - 1 VIEW  COMPARISON:  08/15/2013.  FINDINGS: Persistent cardiomegaly with pulmonary vascular prominence and interstitial prominence suggesting congestive heart failure with interstitial edema noted. Interstitial pneumonitis cannot be excluded. Cardiac pacer. Lead tips in stable position. No pneumothorax. No acute osseous abnormality.  IMPRESSION: Stable chest with changes of congestive heart failure with pulmonary interstitial edema. Cardiac pacer in stable position.   Electronically Signed   By: Marcello Moores  Register   On: 08/17/2013 07:41   Dg Chest Portable 1 View  08/15/2013   CLINICAL DATA:  Sepsis, respiratory distress  EXAM: PORTABLE CHEST - 1 VIEW  COMPARISON:  Prior radiograph from 08/13/2013  FINDINGS: Left-sided pacemaker is unchanged. Cardiomegaly is stable as compared to the prior exam.  Lungs are normally inflated. There has been worsening of pulmonary vascular congestion and edema as compared to the prior examination. No definite pleural effusion. No focal infiltrates appreciated. No pneumothorax.  Osseous structures are unchanged.  IMPRESSION: Stable cardiomegaly with interval worsening of pulmonary vascular congestion/ edema relative to 08/13/2013.   Electronically Signed   By: Jeannine Boga M.D.   On: 08/15/2013  01:16   Dg Chest Port 1 View  08/13/2013   CLINICAL DATA:  Leukocytosis.  EXAM: PORTABLE CHEST - 1 VIEW  COMPARISON:  08/01/2013  FINDINGS: No focal pulmonary consolidation is identified. There is stable mild pulmonary venous prominence without overt edema or visualized pleural fluid. The heart remains mildly enlarged. There is stable appearance of a biventricular pacemaker.  IMPRESSION: No acute findings. Stable cardiomegaly and pulmonary venous prominence without overt edema.   Electronically Signed   By: Aletta Edouard M.D.   On: 08/13/2013 15:44    Procedures:  2-D echo 08/12/2013  X-Arviso of the right hip 08/11/2013  CT abdomen and pelvis pending  Incision and drainage of abscess, Gore-Tex mesh removal, debridement of abdominal wall and peritoneum   Consultants:  Cardiology: Dr. Terrence Dupont 08/13/2013  General surgery: Dr. Johney Maine 08/14/2013  ID: Dr Megan Salon 08/15/13      Subjective: Patient denies abdominal pain. No complaints. Feels less fatigued than yesterday.  Objective: Filed Vitals:   08/29/13 1500 08/29/13 2000 08/30/13 0445 08/30/13 0755  BP: 123/56 125/66 135/72   Pulse: 61 61 60   Temp: 98.5 F (36.9 C) 98.2 F (36.8 C) 97.3 F (36.3 C)   TempSrc: Axillary Oral Oral   Resp: 16 20 20 19   Height:      Weight:   148.4 kg (327 lb 2.6 oz)   SpO2: 98% 100% 100%     Intake/Output Summary (Last 24 hours) at 08/30/13 1429 Last data filed at 08/30/13 1350  Gross per 24 hour  Intake    850 ml  Output   1850 ml  Net  -1000 ml   Weight change: -2 kg (-4 lb 6.6 oz) Exam:   General:  Pt is alert, follows commands appropriately, not in acute distress  HEENT: No icterus, No thrush,  [place]/AT  Cardiovascular: RRR, S1/S2, no rubs, no gallops  Respiratory: CTA bilaterally, no wheezing, no crackles, no rhonchi Abdomen: Soft/+BS, non tender, non distended, no guarding; large ventral open wound with ostomy bag; no erythema--no necrosis, no crepitance    Extremities: trace  LE edema, No lymphangitis, No petechiae, No rashes, no synovitis  Data Reviewed: Basic Metabolic Panel:  Recent Labs Lab 08/24/13 0400  08/26/13 0517 08/27/13 0550 08/28/13 0420 08/29/13 0500 08/30/13 0500  NA  --   < > 137 132* 134* 135 137  K  --   < > 3.3* 3.4* 3.2* 3.4* 3.4*  CL  --   < > 95* 95* 94* 95* 96  CO2  --   < > 32 32 31 33* 32  GLUCOSE  --   < > 98 122* 96 86 94  BUN  --   < > 28* 30* 31* 30* 37*  CREATININE  --   < > 1.31 1.37* 1.34 1.35 1.63*  CALCIUM  --   < > 9.1 8.7 8.9 8.9 8.8  MG 1.8  --  1.8  --   --   --  1.9  PHOS  --   --  5.0* 3.9 3.6  --  5.4*  < > = values in this interval  not displayed. Liver Function Tests:  Recent Labs Lab 08/26/13 0517 08/30/13 0500  AST 14 17  ALT 10 9  ALKPHOS 29* 40  BILITOT 0.5 0.6  PROT 7.1 7.1  ALBUMIN 2.1* 2.0*   No results found for this basename: LIPASE, AMYLASE,  in the last 168 hours No results found for this basename: AMMONIA,  in the last 168 hours CBC:  Recent Labs Lab 08/26/13 0517 08/27/13 0550 08/28/13 0420 08/29/13 0500 08/30/13 0500  WBC 10.1 9.8 9.2 8.5 7.6  NEUTROABS  --   --   --   --  4.1  HGB 12.1* 11.5* 11.9* 11.7* 11.4*  HCT 35.9* 34.5* 36.0* 33.9* 33.8*  MCV 96.2 96.1 95.2 95.0 94.7  PLT 300 276 280 266 264   Cardiac Enzymes: No results found for this basename: CKTOTAL, CKMB, CKMBINDEX, TROPONINI,  in the last 168 hours BNP: No components found with this basename: POCBNP,  CBG:  Recent Labs Lab 08/29/13 2003 08/30/13 0028 08/30/13 0432 08/30/13 0815 08/30/13 1206  GLUCAP 142* 143* 108* 121* 118*    No results found for this or any previous visit (from the past 240 hour(s)).   Scheduled Meds: . acetaminophen  1,000 mg Oral TID  . antiseptic oral rinse  15 mL Mouth Rinse q12n4p  . atorvastatin  40 mg Oral QODAY  . carvedilol  3.125 mg Oral BID WC  . chlorhexidine  15 mL Mouth Rinse BID  . furosemide  80 mg Oral BID  . insulin aspart  0-20 Units Subcutaneous Q4H   . insulin glargine  65 Units Subcutaneous QHS  . lip balm  1 application Topical BID  . octreotide  150 mcg Subcutaneous TID  . OxyCODONE  15 mg Oral Q12H  . piperacillin-tazobactam (ZOSYN)  IV  3.375 g Intravenous Q8H  . potassium chloride  10 mEq Intravenous Q1 Hr x 3  . potassium chloride  40 mEq Oral Daily  . psyllium  1 packet Oral Daily  . saccharomyces boulardii  250 mg Oral BID  . sodium chloride  3 mL Intravenous Q12H  . vancomycin  2,000 mg Intravenous Q24H   Continuous Infusions: . sodium chloride 50 mL/hr at 08/28/13 1640  . [name] KitchenTPN (CLINIMIX-E) Adult 125 mL/hr at 08/29/13 1741   And  . fat emulsion 240 mL (08/29/13 1740)  . TPN (CLINIMIX) Adult without lytes     And  . fat emulsion       Sherrel Ploch, MD  [facility] Pager [phone]  If 7PM-7AM, please contact night-coverage [email or web address] Password [email or web address] 08/30/2013, 2:29 PM   LOS: 19 days

## 2013-08-30 NOTE — Progress Notes (Signed)
Physical Therapy Treatment Patient Details Name: Kurt Cross MRN: [id number] DOB: Dec 17, 1950 Today's Date: 08/30/2013 Time: [id number] PT Time Calculation (min): 13 min  PT Assessment / Plan / Recommendation  History of Present Illness Kurt Cross is a 62 y.o. male  with known history of chronic systolic heart failure status post defibrillator placement last EF measured was in 2013 was 35-40%, on home oxygen, OSA noncompliant with CPAP, diabetes mellitus, hypertension and hyperlipidemia presented to the ER because right leg pain after sustaining a fall the night prior to admission.  08/14/13: s/p abdominal abscess removal   PT Comments   Pt participatory in ambulation today. Pt progressing well.   Follow Up Recommendations  SNF     Does the patient have the potential to tolerate intense rehabilitation     Barriers to Discharge        Equipment Recommendations  None recommended by PT    Recommendations for Other Services    Frequency Min 3X/week   Progress towards PT Goals Progress towards PT goals: Progressing toward goals  Plan Current plan remains appropriate    Precautions / Restrictions Precautions Precautions: Fall Precaution Comments: ostomy bag on R side   Pertinent Vitals/Pain sats 92% after ambulating.    Mobility  Bed Mobility Bed Mobility: Not assessed Transfers Sit to Stand: 4: Min guard;From chair/3-in-1;With upper extremity assist Stand to Sit: 4: Min guard;With upper extremity assist;With armrests;To chair/3-in-1 Details for Transfer Assistance: min guard for safety. pt rocks and lurches to stand and has decreased control of descent. Ambulation/Gait Ambulation/Gait Assistance: 4: Min guard Ambulation Distance (Feet): 135 Feet Assistive device: Rolling walker Ambulation/Gait Assistance Details: +2  for safety and lines. Gait velocity: decreased General Gait Details: pt's SaO2 after ambulation on room air: 90-92% with no c/o SOB/dizziness.    Exercises      PT Diagnosis:    PT Problem List:   PT Treatment Interventions:     PT Goals (current goals can now be found in the care plan section)    Visit Information  Last PT Received On: 08/30/13 Assistance Needed: +2 (lines) History of Present Illness: Kurt Cross is a 62 y.o. male  with known history of chronic systolic heart failure status post defibrillator placement last EF measured was in 2013 was 35-40%, on home oxygen, OSA noncompliant with CPAP, diabetes mellitus, hypertension and hyperlipidemia presented to the ER because right leg pain after sustaining a fall the night prior to admission.  08/14/13: s/p abdominal abscess removal    Subjective Data      Cognition  Cognition Arousal/Alertness: Awake/alert    Balance     End of Session PT - End of Session Activity Tolerance: Patient tolerated treatment well Patient left: in chair;with call bell/phone within reach Nurse Communication: Mobility status;Other (comment)   GP     Claretha Cooper 08/30/2013, 4:30 PM Tresa Endo PT [phone]

## 2013-08-30 NOTE — Progress Notes (Signed)
General surgery attending note: Patient interviewed and examined . Care plan discussed with the patient, wife, and with Dr. Grandville Silos.  POD#16. Patient is pleasant and in no distress.  His abdomen is soft and benign.  Eakin's pouches controlling his fistula drainage nicely. I agree that he is slightly volume depleted. This was discussed with Dr. Grandville Silos. We will need to be careful with volume  expansion because of his systolic and diastolic CHF,  atrial fibrillation, CKD, and so forth.  Continue Sandostatin. Monitor drainage to see if this helps.  It will be at least 6 months before the wound contracts and  we can consider reexploration.  Hopefully he can eventually be managed as an outpatient with TNA and visiting nurses for wound care.   Edsel Petrin. Dalbert Batman, M.D., [facility] General and Minimally invasive Surgery Breast and Colorectal Surgery Office:   [phone] Pager:   [phone]

## 2013-08-30 NOTE — Progress Notes (Signed)
Placed pt. On cpap. Pt. Is tolerating well at this time.  

## 2013-08-31 LAB — BASIC METABOLIC PANEL
CO2: 30 mEq/L (ref 19–32)
Glucose, Bld: 107 mg/dL — ABNORMAL HIGH (ref 70–99)
Potassium: 3.6 mEq/L (ref 3.5–5.1)
Sodium: 134 mEq/L — ABNORMAL LOW (ref 135–145)

## 2013-08-31 LAB — CBC
HCT: 33.3 % — ABNORMAL LOW (ref 39.0–52.0)
Hemoglobin: 11.4 g/dL — ABNORMAL LOW (ref 13.0–17.0)
MCHC: 34.2 g/dL (ref 30.0–36.0)
MCV: 94.3 fL (ref 78.0–100.0)
RBC: 3.53 MIL/uL — ABNORMAL LOW (ref 4.22–5.81)
WBC: 7.8 10*3/uL (ref 4.0–10.5)

## 2013-08-31 LAB — GLUCOSE, CAPILLARY
Glucose-Capillary: 108 mg/dL — ABNORMAL HIGH (ref 70–99)
Glucose-Capillary: 113 mg/dL — ABNORMAL HIGH (ref 70–99)
Glucose-Capillary: 118 mg/dL — ABNORMAL HIGH (ref 70–99)
Glucose-Capillary: 129 mg/dL — ABNORMAL HIGH (ref 70–99)

## 2013-08-31 MED ORDER — LOPERAMIDE HCL 2 MG PO CAPS
2.0000 mg | ORAL_CAPSULE | Freq: Every day | ORAL | Status: DC
  Administered 2013-09-01 – 2013-09-05 (×4): 2 mg via ORAL
  Filled 2013-08-31 (×7): qty 1

## 2013-08-31 MED ORDER — FAT EMULSION 20 % IV EMUL
250.0000 mL | INTRAVENOUS | Status: DC
  Administered 2013-08-31: 250 mL via INTRAVENOUS
  Filled 2013-08-31: qty 250

## 2013-08-31 MED ORDER — APIXABAN 5 MG PO TABS
5.0000 mg | ORAL_TABLET | Freq: Two times a day (BID) | ORAL | Status: DC
  Administered 2013-08-31 – 2013-10-15 (×91): 5 mg via ORAL
  Filled 2013-08-31 (×92): qty 1

## 2013-08-31 MED ORDER — FUROSEMIDE 40 MG PO TABS
40.0000 mg | ORAL_TABLET | Freq: Every day | ORAL | Status: DC
  Administered 2013-09-01 – 2013-10-15 (×45): 40 mg via ORAL
  Filled 2013-08-31 (×45): qty 1

## 2013-08-31 MED ORDER — VANCOMYCIN HCL 10 G IV SOLR
1750.0000 mg | INTRAVENOUS | Status: DC
  Administered 2013-08-31: 11:00:00 1750 mg via INTRAVENOUS
  Filled 2013-08-31: qty 1750

## 2013-08-31 MED ORDER — LOPERAMIDE HCL 2 MG PO CAPS: 2.0000 mg | ORAL_CAPSULE | Freq: Three times a day (TID) | ORAL | Status: DC | PRN

## 2013-08-31 MED ORDER — TRACE MINERALS CR-CU-F-FE-I-MN-MO-SE-ZN IV SOLN
INTRAVENOUS | Status: DC
  Administered 2013-08-31: 18:00:00 via INTRAVENOUS
  Filled 2013-08-31: qty 3000

## 2013-08-31 NOTE — Progress Notes (Signed)
Physical Therapy Treatment Patient Details Name: Kurt Cross MRN: [id number] DOB: 1951-01-22 Today's Date: 08/31/2013 Time: [id number] PT Time Calculation (min): 12 min  PT Assessment / Plan / Recommendation  History of Present Illness Kurt Cross is a 62 y.o. male  with known history of chronic systolic heart failure status post defibrillator placement last EF measured was in 2013 was 35-40%, on home oxygen, OSA noncompliant with CPAP, diabetes mellitus, hypertension and hyperlipidemia presented to the ER because right leg pain after sustaining a fall the night prior to admission.  08/14/13: s/p abdominal abscess removal   PT Comments   Pt OOB in recliner.  Assisted with amb in hallway limited distance.  Pt progressing slowly.  Follow Up Recommendations  SNF     Does the patient have the potential to tolerate intense rehabilitation     Barriers to Discharge        Equipment Recommendations  None recommended by PT    Recommendations for Other Services    Frequency Min 3X/week   Progress towards PT Goals Progress towards PT goals: Progressing toward goals  Plan      Precautions / Restrictions Precautions Precautions: Fall Precaution Comments: ostomy bag on R side and wall suction to ABD Restrictions Weight Bearing Restrictions: No    Pertinent Vitals/Pain No c/o pain  "alright"    Mobility  Bed Mobility Bed Mobility: Not assessed Details for Bed Mobility Assistance: Pt OOB in recliner Transfers Transfers: Sit to Stand;Stand to Sit Sit to Stand: 5: Supervision;4: Min guard;From chair/3-in-1 Stand to Sit: 5: Supervision;4: Min guard;To chair/3-in-1 Details for Transfer Assistance: min guard for safety. pt rocks and lurches to stand and has decreased control of descent. Ambulation/Gait Ambulation/Gait Assistance: 4: Min guard Ambulation Distance (Feet): 135 Feet Assistive device: Rolling walker Ambulation/Gait Assistance Details: increased time Gait Pattern:  Step-through pattern;Wide base of support;Decreased stride length Gait velocity: decreased General Gait Details: pt's SaO2 after ambulation on room air: 90-92% with no c/o SOB/dizziness.     PT Goals (current goals can now be found in the care plan section)    Visit Information  Last PT Received On: 08/31/13 Assistance Needed: +1 History of Present Illness: Kurt Cross is a 62 y.o. male  with known history of chronic systolic heart failure status post defibrillator placement last EF measured was in 2013 was 35-40%, on home oxygen, OSA noncompliant with CPAP, diabetes mellitus, hypertension and hyperlipidemia presented to the ER because right leg pain after sustaining a fall the night prior to admission.  08/14/13: s/p abdominal abscess removal    Subjective Data      Cognition       Balance     End of Session PT - End of Session Equipment Utilized During Treatment: Gait belt Activity Tolerance: Patient tolerated treatment well Patient left: in chair;with call bell/phone within reach   Rica Koyanagi  PTA [facility]  Acute  Rehab Pager      [phone]

## 2013-08-31 NOTE — Progress Notes (Signed)
PARENTERAL NUTRITION CONSULT NOTE - FOLLOW UP  Pharmacy Consult for TNA Indication: Enterocutaneous fistula  No Known Allergies  Patient Measurements: Height: 6' (182.9 cm) Weight: 330 lb 7.5 oz (149.9 kg) IBW/kg (Calculated) : 77.6 Adjusted Body Weight: 102.6kg  Vital Signs: Temp: 98.4 F (36.9 C) (11/25 0410) Temp src: Oral (11/24 2044) BP: 113/59 mmHg (11/25 0410) Pulse Rate: 59 (11/25 0410) Intake/Output from previous day: 11/24 0701 - 11/25 0700 In: 7920.8 [P.O.:240; I.V.:1195.8; IV Piggyback:950; [id number] Out: 2800 [id number]; Drains:850]  Labs:  Recent Labs  08/29/13 0500 08/30/13 0500 08/31/13 0650  WBC 8.5 7.6 7.8  HGB 11.7* 11.4* 11.4*  HCT 33.9* 33.8* 33.3*  PLT 266 264 237    Recent Labs  08/29/13 0500 08/30/13 0500 08/30/13 0502 08/31/13 0650  NA 135 137  --  134*  K 3.4* 3.4*  --  3.6  CL 95* 96  --  97  CO2 33* 32  --  30  GLUCOSE 86 94  --  107*  BUN 30* 37*  --  40*  CREATININE 1.35 1.63*  --  1.66*  CALCIUM 8.9 8.8  --  8.7  MG  --  1.9  --   --   PHOS  --  5.4*  --  4.0  PROT  --  7.1  --   --   ALBUMIN  --  2.0*  --   --   AST  --  17  --   --   ALT  --  9  --   --   ALKPHOS  --  40  --   --   BILITOT  --  0.6  --   --   PREALBUMIN  --  11.2*  --   --   TRIG  --   --  99  --    Estimated Creatinine Clearance: 71.3 ml/min (by C-G formula based on Cr of 1.66).    Recent Labs  08/30/13 2041 08/31/13 0013 08/31/13 0411  GLUCAP 129* 113* 108*    Medications:  Infusions:  . sodium chloride 50 mL/hr at 08/31/13 0410  . TPN (CLINIMIX) Adult without lytes 125 mL/hr at 08/30/13 1816   And  . fat emulsion 250 mL (08/30/13 1815)    Insulin Requirements in the past 24 hours:   Lantus 65units sq qHS   9 units SSI required  - CBG q4h with resistant scale coverage  50 units regular insulin added to TNA   Nutritional Goals:   Per RD 11/13:  2400-2700 kcal/day, 160-180 grams/day protein, 3.3-3.5 L/day fluid  Clinimix  E5/15 at goal rate 125 ml/hr (3L/day) with 20% lipids at 10 ml/hr to provide 150g protein and 2675 kcal daily.  Due to how much fluid can be placed in TNA bag, Max volume is 3L.   Current Nutrition:   NPO  Clinimix 5/15 at 125 ml/hr and 20% lipids at 42ml/hr   IVF: NS at 57ml/hr  Assessment: 62 yo male with large abdominal abscess with infected old Gore Tex mesh s/p I&D abscess, removal of mesh, debridement of abdominal wall and peritoneum. 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 TNA started 11/12 at 10pm.   Glucose: Hx DM on insulin PTA, CBG improved with addition of insulin to TNA 11/16 and increased Lantus doses.  Insulin in TNA slightly decreased on 11/23 due to low CBGs.  CBGs and serum glucose at goal currently.    Electrolytes: Na low, K wnl after supplementation, and Phos wnl after removing electrolytes  from TNA.  Will add electrolytes back today.  SCr: increased to 1.66  LFTs: wnl, albumin only 2 (11/24)  TGs: wnl, 124 (11/13), 89 (11/17), 99 (11/24)  Prealbumin: low, 10.3 (11/13), 11.1 (11/17), 11.2(11/24)  Monitor fluid status very closely since TNA at high goal rate (TNA + lipids + IVF = 185 ml/hr).  I/Os documented, but unsure of accuracy of documentation.  Octreotide started 11/17 to control fistula output.  Drain output recorded as 850 mL out on 11/24.  Lasix decreased from 80mg  BID to 40mg  BID on 11/24 due to rising SCr.  TPN Access: Triple lumen PICC placed 11/12 TPN day#: 14  Plan:   Add electrolytes back to TNA - Clinimix E 5/15 at goal rate of 177ml/hr.  Continue 20% fat emulsion at 46ml/hr.  Continue insulin 50 units per 3L bag.  Continue Lantus 65 units qhs  KCl 24mEq PO daily per MD  TNA to contain standard multivitamins and trace elements.  Continue IVF per MD - NS at 31ml/hr  TNA lab panels on Mondays & Thursdays.  BMET and Phos ordered for AM labs.  F/u clinical progress   Gretta Arab PharmD, BCPS Pager [phone] 08/31/2013  7:47 AM

## 2013-08-31 NOTE — Progress Notes (Signed)
ANTICOAGULATION CONSULT NOTE - Initial Consult  Pharmacy Consult for Apixiban Indication: atrial fibrillation  No Known Allergies  Patient Measurements: Height: 6' (182.9 cm) Weight: 327 lb 2.6 oz (148.4 kg) IBW/kg (Calculated) : 77.6   Vital Signs: Temp: 98.4 F (36.9 C) (11/25 0410) Temp src: Oral (11/24 2044) BP: 113/59 mmHg (11/25 0410) Pulse Rate: 59 (11/25 0410)  Labs:  Recent Labs  08/29/13 0500 08/30/13 0500  HGB 11.7* 11.4*  HCT 33.9* 33.8*  PLT 266 264  CREATININE 1.35 1.63*    Estimated Creatinine Clearance: 72.2 ml/min (by C-G formula based on Cr of 1.63).   Medical History: Past Medical History  Diagnosis Date  . Sleep apnea   . Diabetes mellitus   . Hypertension   . Hyperlipidemia   . CHF (congestive heart failure)   . Hernia   . AAA (abdominal aortic aneurysm)/ 4.5 cm ascending per CT angio 08/01/13 08/12/2013  . Infected prosthetic mesh of abdominal wall with GIANT abscess s/p removal 08/14/2013 08/14/2013    Medications:  Scheduled:  . acetaminophen  1,000 mg Oral TID  . antiseptic oral rinse  15 mL Mouth Rinse q12n4p  . apixaban  5 mg Oral BID  . atorvastatin  40 mg Oral QODAY  . carvedilol  3.125 mg Oral BID WC  . chlorhexidine  15 mL Mouth Rinse BID  . furosemide  40 mg Oral BID  . insulin aspart  0-20 Units Subcutaneous Q4H  . insulin glargine  65 Units Subcutaneous QHS  . lip balm  1 application Topical BID  . octreotide  150 mcg Subcutaneous TID  . OxyCODONE  15 mg Oral Q12H  . piperacillin-tazobactam (ZOSYN)  IV  3.375 g Intravenous Q8H  . potassium chloride  40 mEq Oral Daily  . psyllium  1 packet Oral Daily  . saccharomyces boulardii  250 mg Oral BID  . sodium chloride  3 mL Intravenous Q12H  . vancomycin  2,000 mg Intravenous Q24H   Infusions:  . sodium chloride 50 mL/hr at 08/31/13 0410  . TPN (CLINIMIX) Adult without lytes 125 mL/hr at 08/30/13 1816   And  . fat emulsion 250 mL (08/30/13 1815)    Assessment: 62 yo  with abscess anterior abd wall on IV Abx per Rx.  Started on Apixiban 11/7, but d/c'd 11/8 due to emergent surgery.  On Lovenox post-op which was d/c'd 11/18 due to blood coming from ventral wound.  MD now restarting apixaban. Pt wt 148kg, Age=62, SCr=1.63  Goal of Therapy:  Prevention of stroke and systemic thromboembolism Plan:   Apixaban 5mg  bid.    F/U s/s bleeding  Dorrene German 08/31/2013,6:18 AM

## 2013-08-31 NOTE — Progress Notes (Signed)
NUTRITION FOLLOW UP  Intervention:   Continue TPN per Pharmacy Diet advancement per MD discretion RD to continue monitor  Nutrition Dx:   Inadequate oral intake related to EC fistula as evidenced by NPO status; ongoing  Goal:   Pt to meet >/= 90% of their estimated nutrition needs; being met  Monitor:   TPN rate; clinamix E 5/15 @ goal rate of 125 ml/hr with lipids @ 10 ml/hr Weight; 26 lbs weight loss since admission, 3 lb weight loss in the past 6 days Labs; Blood glucose ranging 108-130 mg/dL, low sodium, potassium now WNL, low albumin, low GFR, phosphorus now WNL; Triglycerides still WNL (11/24) Diet advancement; none  Assessment:   62 y.o. male with known history of chronic systolic heart failure status post defibrillator placement last EF measured was in 2013 was 35-40%, on home oxygen, OSA noncompliant with CPAP, diabetes mellitus, hypertension and hyperlipidemia. CT scan revealed a large abdominal wall abscess.  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 TNA started 11/12 at 10pm.  Clinimix 5/15 running at goal rate of 125 ml/hr with 20% lipids at 50ml/hr; provides 150g protein and 2675 kcal daily. This meets 100% of estimated energy needs and 94% of estimated protein needs. Weight loss is slowing down with 3 lb weight loss in the past 5 days.  RD will continue to monitor weight trends and re-estimate energy needs as needed.   Height: Ht Readings from Last 1 Encounters:  08/14/13 6' (1.829 m)    Weight Status:   Wt Readings from Last 1 Encounters:  08/31/13 330 lb 7.5 oz (149.9 kg)    Re-estimated needs:  Kcal: 2400-2700 Protein: 160-180 grams Fluid: 3.3-3.5 L/day  Skin: non-pitting RLE and LLE edema; abdominal incisions/wounds with closed system drains  Diet Order: NPO   Intake/Output Summary (Last 24 hours) at 08/31/13 1525 Last data filed at 08/31/13 1300  Gross per 24 hour  Intake 5210.42 ml  Output   3325 ml  Net 1885.42 ml    Last BM:  11/18   Labs:   Recent Labs Lab 08/26/13 0517  08/28/13 0420 08/29/13 0500 08/30/13 0500 08/31/13 0650  NA 137  < > 134* 135 137 134*  K 3.3*  < > 3.2* 3.4* 3.4* 3.6  CL 95*  < > 94* 95* 96 97  CO2 32  < > 31 33* 32 30  BUN 28*  < > 31* 30* 37* 40*  CREATININE 1.31  < > 1.34 1.35 1.63* 1.66*  CALCIUM 9.1  < > 8.9 8.9 8.8 8.7  MG 1.8  --   --   --  1.9  --   PHOS 5.0*  < > 3.6  --  5.4* 4.0  GLUCOSE 98  < > 96 86 94 107*  < > = values in this interval not displayed.  CBG (last 3)   Recent Labs  08/31/13 0411 08/31/13 0825 08/31/13 1220  GLUCAP 108* 112* 118*    Scheduled Meds: . acetaminophen  1,000 mg Oral TID  . antiseptic oral rinse  15 mL Mouth Rinse q12n4p  . apixaban  5 mg Oral BID  . atorvastatin  40 mg Oral QODAY  . carvedilol  3.125 mg Oral BID WC  . [START ON 09/01/2013] furosemide  40 mg Oral Daily  . insulin aspart  0-20 Units Subcutaneous Q4H  . insulin glargine  65 Units Subcutaneous QHS  . lip balm  1 application Topical BID  . loperamide  2 mg Oral QHS  .  octreotide  150 mcg Subcutaneous TID  . OxyCODONE  15 mg Oral Q12H  . potassium chloride  40 mEq Oral Daily  . saccharomyces boulardii  250 mg Oral BID  . sodium chloride  3 mL Intravenous Q12H    Continuous Infusions: . sodium chloride 50 mL/hr at 08/31/13 0410  . TPN (CLINIMIX) Adult without lytes 125 mL/hr at 08/30/13 1816   And  . fat emulsion 250 mL (08/30/13 1815)  . [name] KitchenTPN (CLINIMIX-E) Adult     And  . fat emulsion      Pryor Ochoa RD, LDN Inpatient Clinical Dietitian Pager: [phone] After Hours Pager: [phone]

## 2013-08-31 NOTE — Progress Notes (Signed)
ANTIBIOTIC CONSULT NOTE   Pharmacy Consult for Zosyn/Vancomycin Indication: intra-abdominal abscess  No Known Allergies  Patient Measurements: Height: 6' (182.9 cm) Weight: 330 lb 7.5 oz (149.9 kg) IBW/kg (Calculated) : 77.6  Vital Signs: Temp: 98.4 F (36.9 C) (11/25 0410) Temp src: Oral (11/24 2044) BP: 113/59 mmHg (11/25 0410) Pulse Rate: 59 (11/25 0410)  Labs:  Recent Labs  08/29/13 0500 08/30/13 0500 08/31/13 0650  WBC 8.5 7.6 7.8  HGB 11.7* 11.4* 11.4*  PLT 266 264 237  CREATININE 1.35 1.63* 1.66*   Vancomycin trough 08/18/13:  12.3 mcg/ml on 2g q24h Vancomycin trough 08/24/13:  13.9 mcg/ml on 2g q24h Vancomycin trough 08/27/13:  14.5 mcg/ml on 2g q24h Vancomycin trough 08/31/13:  20.8 mcg/ml on 2g q24h  Antibiotics and culture results: 11/8 >> Zosyn >>  11/9 >> Vancomycin>> ==================== 11/5 urine >> NGF 11/7 urine >> NGF 11/7 blood x2>> NGTF 11/8: abdomen incision drainage: rare coag neg staph, no anaerobes 11/9: C. Diff: negative 11/9: blood x 2: NGF 11/9: urine: NGF   Assessment: 62 y/o M presented with fluid collection / abscess within anterior abdominal wall.  On 08/14/13 underwent I&D with removal of infected prosthetic Gore-tex mesh and evacuation of 3L purulent fluid.    D#18 Zosyn 3.375 grams IV q8h   D#17 Vancomycin 2 grams IV q24h of planned 2-3 weeks per ID  Afebrile, WBC remains wnl.  SCr 1.63, continues to slowly increase  Vancomycin trough slightly subtherapeutic on 11/21, but with rising SCr, has now increased to slightly supratherapeutic level.  Dose will be decreased, recheck VT as needed.  Goal: Vancomycin trough 15-20 mcg/ml for abscess Appropriate abx dosing, eradication of infection.   Plan:   Decrease to Vancomycin 1750 gm IV q24h  Continue Zosyn 3.375 gm IV q8h (extended infusion, each dose over 4 hours)  Monitor SCr closely and adjust doses as needed   Gretta Arab PharmD, BCPS Pager  [phone] 08/31/2013 8:13 AM

## 2013-08-31 NOTE — Progress Notes (Signed)
Occupational Therapy Treatment Patient Details Name: Kurt Cross MRN: [id number] DOB: 1951-09-11 Today's Date: 08/31/2013 Time: [id number] OT Time Calculation (min): 8 min  OT Assessment / Plan / Recommendation  History of present illness Kurt Cross is a 62 y.o. male  with known history of chronic systolic heart failure status post defibrillator placement last EF measured was in 2013 was 35-40%, on home oxygen, OSA noncompliant with CPAP, diabetes mellitus, hypertension and hyperlipidemia presented to the ER because right leg pain after sustaining a fall the night prior to admission.  08/14/13: s/p abdominal abscess removal   OT comments  Patient fatigued from earlier PT session and sitting up in chair after that. He had just gotten back to bed. Patient reports wife purchased AE (reacher, long sponge, sock aid, shoe horn). Reviewed AE use for LB self-care.  Follow Up Recommendations  SNF;Supervision/Assistance - 24 hour    Barriers to Discharge       Equipment Recommendations       Recommendations for Other Services    Frequency Min 2X/week   Progress towards OT Goals Progress towards OT goals: Progressing toward goals  Plan Discharge plan remains appropriate    Precautions / Restrictions Precautions Precautions: Fall Precaution Comments: ostomy bag on R side and wall suction to ABD Restrictions Weight Bearing Restrictions: No   Pertinent Vitals/Pain C/o "12"/10 pain, nurse in room giving pain meds    ADL  Upper Body Bathing: Simulated;Set up Where Assessed - Upper Body Bathing: Supine, head of bed up Lower Body Bathing: Simulated;Set up (long sponge) Where Assessed - Lower Body Bathing: Supine, head of bed up Transfers/Ambulation Related to ADLs: Patient declined mobility as he had just gotten back to bed after being up for a few hours and after walking with PT. ADL Comments: Reviewed use of AE for LB bathing/dressing. Patient's wife purchased kit of long sponge, reacher,  sock aide, shoe horn. Patient declined actual performance of bathing/dressing during session.    OT Diagnosis:    OT Problem List:   OT Treatment Interventions:     OT Goals(current goals can now be found in the care plan section)    Visit Information  Last OT Received On: 08/31/13 Assistance Needed: +1 History of Present Illness: Kurt Cross is a 62 y.o. male  with known history of chronic systolic heart failure status post defibrillator placement last EF measured was in 2013 was 35-40%, on home oxygen, OSA noncompliant with CPAP, diabetes mellitus, hypertension and hyperlipidemia presented to the ER because right leg pain after sustaining a fall the night prior to admission.  08/14/13: s/p abdominal abscess removal    Subjective Data      Prior Functioning       Cognition  Cognition Arousal/Alertness: Awake/alert Behavior During Therapy: WFL for tasks assessed/performed    Mobility     Exercises      Balance     End of Session OT - End of Session Activity Tolerance: Patient limited by fatigue (states tired from sitting up in chair/working with PT) Patient left: in bed;with call bell/phone within reach;with nursing/sitter in room Nurse Communication: Other (comment) (nurse in giving pt pain meds)  GO     Brittnye Josephs A 08/31/2013, 5:32 PM

## 2013-08-31 NOTE — Progress Notes (Signed)
Placed pt. On cpap. Pt.is tolerating well at this time.

## 2013-08-31 NOTE — Consult Note (Addendum)
WOC follow-up: Current Eakin pouch intact with good seal.  Suction to wall suction working well with large amt thick brown drainage in cannister.  Pt states he has flushed red rubber catheter to avoid clogging after yesterday's instructional session.  He is also able to connect and disconnect suction without assistance to ambulate since stopcock has been applied to red rubber catheter.  Extra supplies and instructions are at bedside for staff use.Pt is interested in discussing options which might allow him to go home instead of a SNF. He and his wife at the bedside will discuss whether they would be willing to handle the complex level of care this would entail after discharge. This plan of care would depend on assistance from [place] and the following options:  1.  If home health agency could obtain an intermittent suction machine for the home, then pt could intermittently insert a suction catheter and drain his bag. (Continuous suction is NOT available) 2.  If home health agency could order the [place], this is a large, oval-shaped pouch which has a drainage spout at the bottom (6:00 o'clock) It could be easily emptied when it begins to fill.  Pt might be able to manage this fistula without suction using this type of pouch, but the seal would probably only last 1-2 days before requiring a pouch change without the option of a suction machine.  [facility] only has Eakin pouches in their formulary and to cover the space of this patient's wound, the pouch has to be applied sideway. The drainage spout is then located at 9:00 o'clock, making it very difficult to empty without suction since the drainage pools near the bottom of the bag.   If possible goal is to discharge pt home; please discuss these options further with the pt, his wife, and the home health liaison to determine further plan of care.  Julien Girt MSN, RN, [place], [place], [place]

## 2013-08-31 NOTE — Progress Notes (Signed)
17 Days Post-Op  Subjective: Pt feels good, very happy with the way his Lynann Beaver pouch is working.  No complaints.  Has been ambulating OOB.  Pain well controlled.  I&O yesterday may have been inadequately documented because this morning the output for the last 24 hours was back up to 854mL.    Objective: Vital signs in last 24 hours: Temp:  [97.8 F (36.6 C)-98.4 F (36.9 C)] 98.4 F (36.9 C) (11/25 0410) Pulse Rate:  [59-61] 59 (11/25 0410) Resp:  [18-20] 20 (11/25 0410) BP: (113-155)/(57-62) 113/59 mmHg (11/25 0410) SpO2:  [100 %] 100 % (11/25 0410) Weight:  [330 lb 7.5 oz (149.9 kg)] 330 lb 7.5 oz (149.9 kg) (11/25 0701) Last BM Date: 08/24/13  Intake/Output from previous day: 11/24 0701 - 11/25 0700 In: 7920.8 [P.O.:240; I.V.:1195.8; IV Piggyback:950; [id number] Out: 2800 [Urine:1950; Drains:850] Intake/Output this shift:    PE: Gen:  Alert, NAD, pleasant Abd: Soft, NT/ND, erythema/induration is significantly improved compared to when I saw him 2 weeks ago, +BS, no HSM, 878mL drained from wound/24 hours which is greenish in color, Eakins pouch in place with Mepitel covering the wound, suction appears to be functioning well with minimal pulling.  Red rubber catheter in place with enteric contents being suctioned into cannister.   Lab Results:   Recent Labs  08/30/13 0500 08/31/13 0650  WBC 7.6 7.8  HGB 11.4* 11.4*  HCT 33.8* 33.3*  PLT 264 237   BMET  Recent Labs  08/30/13 0500 08/31/13 0650  NA 137 134*  K 3.4* 3.6  CL 96 97  CO2 32 30  GLUCOSE 94 107*  BUN 37* 40*  CREATININE 1.63* 1.66*  CALCIUM 8.8 8.7   PT/INR No results found for this basename: LABPROT, INR,  in the last 72 hours CMP     Component Value Date/Time   NA 134* 08/31/2013 0650   K 3.6 08/31/2013 0650   CL 97 08/31/2013 0650   CO2 30 08/31/2013 0650   GLUCOSE 107* 08/31/2013 0650   BUN 40* 08/31/2013 0650   CREATININE 1.66* 08/31/2013 0650   CALCIUM 8.7 08/31/2013 0650   PROT 7.1  08/30/2013 0500   ALBUMIN 2.0* 08/30/2013 0500   AST 17 08/30/2013 0500   ALT 9 08/30/2013 0500   ALKPHOS 40 08/30/2013 0500   BILITOT 0.6 08/30/2013 0500   GFRNONAA 43* 08/31/2013 0650   GFRAA 50* 08/31/2013 0650   Lipase  No results found for this basename: lipase       Studies/Results: No results found.  Anti-infectives: Anti-infectives   Start     Dose/Rate Route Frequency Ordered Stop   08/31/13 0800  vancomycin (VANCOCIN) 1,750 mg in sodium chloride 0.9 % 500 mL IVPB     1,750 mg 250 mL/hr over 120 Minutes Intravenous Every 24 hours 08/31/13 0753     08/16/13 0800  vancomycin (VANCOCIN) 2,000 mg in sodium chloride 0.9 % 500 mL IVPB  Status:  Discontinued     2,000 mg 250 mL/hr over 120 Minutes Intravenous Every 24 hours 08/15/13 1411 08/31/13 0753   08/15/13 1500  vancomycin (VANCOCIN) 2,000 mg in sodium chloride 0.9 % 500 mL IVPB     2,000 mg 250 mL/hr over 120 Minutes Intravenous  Once 08/15/13 1411 08/15/13 1713   08/14/13 1600  piperacillin-tazobactam (ZOSYN) IVPB 3.375 g     3.375 g 12.5 mL/hr over 240 Minutes Intravenous Every 8 hours 08/14/13 1543         Assessment/Plan 1. Giant abdominal abscess with  infected old GoreTex mesh POD #17 s/p I&D abscess, removal of mesh, debridement of abdominal wall & peritoneum (Dr. Johney Cross) 2. Multiple enterocutaneous fistulas  3. PCM/TNA  4. Multiple medical problems   Plan:  1. Patient's creatinine up again today. Suspect this is secondary to high output from drainage.  Monitor fluids closely. 2. Cont TNA while NPO for full nutritional support while unable to take in oral nutrients.  3. Cont Sandostatin and follow output trend (up again today to 816mL/24 hours) 4. Once patient stabilizes with EC fistula output and can maintain hydration etc, then hopefully he can go home on TNA if fistulas still present. This may be some time though.  He will be difficult to place due to need for vacuum component of fistula care.  Wonder  if our only option at discharge would be to place a wound vac with white foam, black foam on top, and Mepitel since no other suction devices may be used as an outpatient. 5. PT recommending SNF placement 6. IV Vanco (started 08/16/13) and Zosyn (started 08/14/13)    LOS: 20 days    Kurt Cross, Kurt Cross 08/31/2013, 9:57 AM Pager: [phone]

## 2013-08-31 NOTE — Progress Notes (Signed)
[facility] PROGRESS NOTE  Kurt Cross [id number] DOB: 1951/03/25 DOA: 08/11/2013 PCP: [name], MD  Brief History 62 y.o. male with a remote history of bowel perforation requiring exploratory laparotomy and temporary colostomy. He then developed a ventral hernia and underwent mesh repair decades ago. He was recently hospitalized and treated for possible pneumonia. He had a fall at home recently and injured his right lower leg leading to readmission. He was noted to be febrile with some abdominal wall swelling. CT scan revealed a large abdominal wall abscess. 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  He was placed on bowel rest and started on TPN. Early in the admission, the patient was fluid overloaded and received intravenous furosemide. He has been since transitioned back to his home dose of oral furosemide and remains clinically stable from CHF standpoint.   Assessment/Plan: #1 infected prosthetic Gore-Tex mesh with a giant abdominal abscess status post IandD and Gore-Tex mesh removal 08/14/2013 now with enterocut fistula Patient is status post incision and drainage of abscess, Gore-Tex mesh removal, debridement of abdominal wall and peritoneum 08/14/2013.  -empiric IV Vancomycin and Zosyn discontinued per CCS. -Blood cultures--neg  -now with EC fistula-->started on sandostatin by surgery 08/23/13 and dose increased.  -wound care and TNA per surgery  -abx per ID  -Continue long acting oxycontin 15mg  q 12hrs for pain control  -Patient was asking for OxyIR for breakthrough pain. -CT abdomen if WBC continues to rise  Continue sandostatin, TNA, supportiver care. Bowel rest.Per CCS #2 sepsis  -secondary to problem #1.  -Wound cultures prelim with GPC in pairs and clusters-->Coag Neg Staph  -Remains hemodynamically stable   IV antibiotics d/'c'd by CCS. #3 hypotension  -resolved   -secondary to problem #2.  -Increased carvedilol to home dose  #4 atrial fibrillation/atrial flutter  -secondary to problem #1.  -currently rate controlled. Initial EKG showed flutter waves.  -Cardiac enzymes have been negative x3. 2-D echo with EF of 50-55%. -CHADSVASc score is 4 (not ASVD plaque on abd aorta).  -Continue Coreg for rate control.  -Eliquis was discontinued initially as patient went for emergent surgery secondary to problem #1.  -d/c [place] lovenox as pt now has blood from ventral wound -scheduled for TEE cardioversion however in light of problem #1 this was cancelled - F/u with cardiology 2-3 weeks post discharge.  Will resume eliquis today, as discussed with general surgery. #5 chronic kidney disease stage III  -Creatinine is trending back down. ACE inhibitor had been discontinued.  -Plan to restart ACE inhibitor if renal function remains stable  -Serum creatinine gradually increasing with decreased sodium--suspect increased insensible loss from the patient's large ventral wound -Normal saline started by CCS--monitor fluid status closely with a history of CHF-- Decreased Lasix to half home dose of 40 mg twice daily. As creatinine is slightly increasing. Follow. #6 leukocytosis/fever  -secondary to problem #1.  -resolved--Currently afebrile.  -WBC up a little but remains stable  -Chest x-Mccants is negative for infiltrate. Patient with no respiratory symptoms.  -CT abdomen if WBC continues to rise  #7 chronic combined systolic and diastolic CHF  -Initial CXR with volume overload. given 4 doses of lasix 40mg  IV -Decrease home dose furosemide to half home dose of 40 mg  po twice a day secondary to slight rising creatinine. -decreased  carvedilol to 3.125mg  bid as BP was soft. Monitor closely with HR. -08/12/2013  echocardiogram shows EF 50-55%--EF improved compared to one year ago (35-40%)  #8 Uncontrolled Pain -Continue long acting OxyContin  15 mg twice a day -Continue OxyIR  to 15 mg every 4 hours prn breakthrough pain #9 hyperlipidemia  Continue statin.  #10 4.5 cm ascending aortic aneurysm per CT scan of 08/01/2013  Stable. Asymptomatic. Continue risk factor modification with good blood pressure control. Will need outpatient followup with vascular surgery as outpatient.  #11 type 2 diabetes  Hemoglobin A1c was 9.5 on 08/02/2013.Continue Lantus 65 units daily. -Sliding scale insulin.  -CBGs slowly creeping up with TNA  #12. Hypomagnesemia  -Repleted  #13 ?? Pauses Patient asymptomatic. If HR decreases will decrease coreg. Cardiology notified and ICD has been interrogated.   Family Communication: Updated patient, no family at beside  Disposition Plan: Home when cleared by surgery     Antibiotics Vancomycin 08/15/13>>> 08/31/13 Zosyn 08/14/13>>>08/31/13               Procedures/Studies: Ct Abdomen Pelvis Wo Contrast  08/14/2013   CLINICAL DATA:  Fever, leukocytosis, prior hernia repairs and appendectomy  EXAM: CT ABDOMEN AND PELVIS WITHOUT CONTRAST  TECHNIQUE: Multidetector CT imaging of the abdomen and pelvis was performed following the standard protocol without intravenous contrast.  COMPARISON:  None.  FINDINGS: Note: Given body habitus, the entire anterior abdominal wall (including a portion of the abnormality) could not be included in the field of view despite two separate attempts.  Prior ventral hernia mesh repair. Just anterior to the mesh, within the anterior abdominal wall, is an abdominal wall fluid collection/abscess measuring approximately 14.5 x 18.1 x 26.1 cm (incompletely visualized). Along the superior aspect of the fluid collection, gas extends to immediately beneath the skin surface (series 10/image 27). Bowel loops are displaced posteriorly by the large collection fluid without definite communication.  A small hernia containing fat and a loop of nondilated colon is present inferiorly (sagittal image 88), to the left of midline  (series 10/image 81 and 86).  Minimal left basilar atelectasis.  Cardiomegaly. Coronary atherosclerosis. ICD leads, incompletely visualized.  Moderate hepatic steatosis.  Unenhanced spleen and pancreas are unremarkable.  Low-density nodular thickening of the bilateral adrenal glands, likely reflecting adrenal adenomas.  Gallbladder is notable for layering gallstone. No intrahepatic or extrahepatic ductal dilatation.  Kidneys are unremarkable. No renal calculi or hydronephrosis.  No evidence of bowel obstruction. Prior appendectomy.  Atherosclerotic calcifications of the abdominal aorta and branch vessels.  No abdominopelvic ascites.  No suspicious abdominopelvic lymphadenopathy.  Prostate is unremarkable.  Bladder is within normal limits.  Degenerative changes of the visualized thoracolumbar spine.  IMPRESSION: 14.5 x 18.1 x 26.1 cm fluid collection/abscess within the anterior abdominal wall, as described above, incompletely visualized. The collection appears just anterior to prior ventral hernia mesh.  Given the size and proximity to the skin surface, incision and drainage is suggested.  These results were called by telephone at the time of interpretation on 08/14/2013 at 3:22 PM to Dr. Irine Seal , who verbally acknowledged these results.   Electronically Signed   By: Julian Hy M.D.   On: 08/14/2013 15:28   Dg Chest 2 View  08/01/2013   CLINICAL DATA:  Chest pain, shortness of breath.  EXAM: CHEST  2 VIEW  COMPARISON:  August 24, 2012.  FINDINGS: Stable cardiomegaly. Left-sided pacemaker is noted. No pleural effusion or pneumothorax is noted. Mild central pulmonary vascular congestion is noted which appears to be chronic. No acute pulmonary disease is noted.  IMPRESSION: No active  cardiopulmonary disease.   Electronically Signed   By: Sabino Dick M.D.   On: 08/01/2013 17:06   Dg Hip Complete Right  08/11/2013   CLINICAL DATA:  Right posterior hip pain.  EXAM: RIGHT HIP - COMPLETE 2+ VIEW   COMPARISON:  None.  FINDINGS: There is no evidence of hip fracture or dislocation. There is no evidence of arthropathy or other focal bone abnormality.  IMPRESSION: No acute osseous injury of the right hip.   Electronically Signed   By: Kathreen Devoid   On: 08/11/2013 15:16   Ct Angio Chest Pe W/cm &/or Wo Cm  08/01/2013   CLINICAL DATA:  Shortness of Breath,  EXAM: CT ANGIOGRAPHY CHEST WITH CONTRAST  TECHNIQUE: Multidetector CT imaging of the chest was performed using the standard protocol during bolus administration of intravenous contrast. Multiplanar CT image reconstructions including MIPs were obtained to evaluate the vascular anatomy.  CONTRAST:  131mL OMNIPAQUE IOHEXOL 350 MG/ML SOLN  COMPARISON:  06/28/2012  FINDINGS: There is fairly good contrast opacification of the pulmonary artery branches with no convincing filling defects to suggest acute PE. There is good contrast opacification of the thoracic aorta with some scattered plaque, no evidence of dissection or stenosis. There is dilatation of the ascending aorta measuring up to 4.5 cm transverse diameter, stable by my measurement since prior study. Bovine brachiocephalic arterial origin anatomy without proximal stenosis. No pleural or pericardial effusion. No hilar or mediastinal adenopathy. Transvenous pacing leads are noted. Minimal linear scarring or subsegmental atelectasis in both lower lobes. Lungs otherwise clear. Visualized portions of upper abdomen unremarkable. Multiple bridging osteophytes throughout the mid and lower thoracic spine. Sternum intact.  Review of the MIP images confirms the above findings.  IMPRESSION: 1. 4.5 cm ascending aortic aneurysm without complicating features. 2. No evidence of acute pulmonary embolism or aortic dissection.   Electronically Signed   By: Arne Cleveland M.D.   On: 08/01/2013 19:55   Dg Chest Port 1 View  08/18/2013   CLINICAL DATA:  Confirm line placement  EXAM: PORTABLE CHEST - 1 VIEW  COMPARISON:   08/17/2013  FINDINGS: Two images were taken. The patient is in semi-upright position and is slightly rotated to the right. A right upper extremity PICC has been placed, the distal tip of which is in the mid superior vena cava. Left chest wall triple lead pacemaker appears stable. Cardiomegaly. There is diffuse pulmonary vascular congestion and similar appearance of pulmonary edema. No pneumothorax or pleural effusion is appreciated.  IMPRESSION: 1. Right upper extremity PICC terminates in the mid superior vena cava. 2. Stable congestive heart failure pattern.   Electronically Signed   By: Curlene Dolphin M.D.   On: 08/18/2013 21:39   Dg Chest Port 1 View  08/17/2013   CLINICAL DATA:  Respiratory failure  EXAM: PORTABLE CHEST - 1 VIEW  COMPARISON:  08/15/2013.  FINDINGS: Persistent cardiomegaly with pulmonary vascular prominence and interstitial prominence suggesting congestive heart failure with interstitial edema noted. Interstitial pneumonitis cannot be excluded. Cardiac pacer. Lead tips in stable position. No pneumothorax. No acute osseous abnormality.  IMPRESSION: Stable chest with changes of congestive heart failure with pulmonary interstitial edema. Cardiac pacer in stable position.   Electronically Signed   By: Marcello Moores  Register   On: 08/17/2013 07:41   Dg Chest Portable 1 View  08/15/2013   CLINICAL DATA:  Sepsis, respiratory distress  EXAM: PORTABLE CHEST - 1 VIEW  COMPARISON:  Prior radiograph from 08/13/2013  FINDINGS: Left-sided pacemaker is unchanged. Cardiomegaly is  stable as compared to the prior exam.  Lungs are normally inflated. There has been worsening of pulmonary vascular congestion and edema as compared to the prior examination. No definite pleural effusion. No focal infiltrates appreciated. No pneumothorax.  Osseous structures are unchanged.  IMPRESSION: Stable cardiomegaly with interval worsening of pulmonary vascular congestion/ edema relative to 08/13/2013.   Electronically Signed   By:  Jeannine Boga M.D.   On: 08/15/2013 01:16   Dg Chest Port 1 View  08/13/2013   CLINICAL DATA:  Leukocytosis.  EXAM: PORTABLE CHEST - 1 VIEW  COMPARISON:  08/01/2013  FINDINGS: No focal pulmonary consolidation is identified. There is stable mild pulmonary venous prominence without overt edema or visualized pleural fluid. The heart remains mildly enlarged. There is stable appearance of a biventricular pacemaker.  IMPRESSION: No acute findings. Stable cardiomegaly and pulmonary venous prominence without overt edema.   Electronically Signed   By: Aletta Edouard M.D.   On: 08/13/2013 15:44    Procedures:  2-D echo 08/12/2013  X-[place] of the right hip 08/11/2013  CT abdomen and pelvis pending  Incision and drainage of abscess, Gore-Tex mesh removal, debridement of abdominal wall and peritoneum   Consultants:  Cardiology: Dr. Terrence Dupont 08/13/2013  General surgery: Dr. Johney Maine 08/14/2013  ID: Dr Megan Salon 08/15/13      Subjective: Patient denies abdominal pain. No complaints.   Objective: Filed Vitals:   08/31/13 0410 08/31/13 0701 08/31/13 0920 08/31/13 1410  BP: 113/59  148/62 127/62  Pulse: 59  61 70  Temp: 98.4 F (36.9 C)  97.6 F (36.4 C) 97.6 F (36.4 C)  TempSrc:   Oral Oral  Resp: 20  20 19   Height:      Weight:  149.9 kg (330 lb 7.5 oz)    SpO2: 100%  100% 100%    Intake/Output Summary (Last 24 hours) at 08/31/13 1558 Last data filed at 08/31/13 1300  Gross per 24 hour  Intake 5210.42 ml  Output   3325 ml  Net 1885.42 ml   Weight change: 1.5 kg (3 lb 4.9 oz) Exam:   General:  Pt is alert, follows commands appropriately, not in acute distress  HEENT: No icterus, No thrush,  [place]/AT  Cardiovascular: RRR, S1/S2, no rubs, no gallops  Respiratory: CTA bilaterally, no wheezing, no crackles, no rhonchi Abdomen: Soft/+BS, non tender, non distended, no guarding; large ventral open wound with ostomy bag; no erythema--no necrosis, no crepitance    Extremities:  trace LE edema, No lymphangitis, No petechiae, No rashes, no synovitis  Data Reviewed: Basic Metabolic Panel:  Recent Labs Lab 08/26/13 0517 08/27/13 0550 08/28/13 0420 08/29/13 0500 08/30/13 0500 08/31/13 0650  NA 137 132* 134* 135 137 134*  K 3.3* 3.4* 3.2* 3.4* 3.4* 3.6  CL 95* 95* 94* 95* 96 97  CO2 32 32 31 33* 32 30  GLUCOSE 98 122* 96 86 94 107*  BUN 28* 30* 31* 30* 37* 40*  CREATININE 1.31 1.37* 1.34 1.35 1.63* 1.66*  CALCIUM 9.1 8.7 8.9 8.9 8.8 8.7  MG 1.8  --   --   --  1.9  --   PHOS 5.0* 3.9 3.6  --  5.4* 4.0   Liver Function Tests:  Recent Labs Lab 08/26/13 0517 08/30/13 0500  AST 14 17  ALT 10 9  ALKPHOS 29* 40  BILITOT 0.5 0.6  PROT 7.1 7.1  ALBUMIN 2.1* 2.0*   No results found for this basename: LIPASE, AMYLASE,  in the last 168 hours No results  found for this basename: AMMONIA,  in the last 168 hours CBC:  Recent Labs Lab 08/27/13 0550 08/28/13 0420 08/29/13 0500 08/30/13 0500 08/31/13 0650  WBC 9.8 9.2 8.5 7.6 7.8  NEUTROABS  --   --   --  4.1  --   HGB 11.5* 11.9* 11.7* 11.4* 11.4*  HCT 34.5* 36.0* 33.9* 33.8* 33.3*  MCV 96.1 95.2 95.0 94.7 94.3  PLT 276 280 266 264 237   Cardiac Enzymes: No results found for this basename: CKTOTAL, CKMB, CKMBINDEX, TROPONINI,  in the last 168 hours BNP: No components found with this basename: POCBNP,  CBG:  Recent Labs Lab 08/30/13 2041 08/31/13 0013 08/31/13 0411 08/31/13 0825 08/31/13 1220  GLUCAP 129* 113* 108* 112* 118*    No results found for this or any previous visit (from the past 240 hour(s)).   Scheduled Meds: . acetaminophen  1,000 mg Oral TID  . antiseptic oral rinse  15 mL Mouth Rinse q12n4p  . apixaban  5 mg Oral BID  . atorvastatin  40 mg Oral QODAY  . carvedilol  3.125 mg Oral BID WC  . [START ON 09/01/2013] furosemide  40 mg Oral Daily  . insulin aspart  0-20 Units Subcutaneous Q4H  . insulin glargine  65 Units Subcutaneous QHS  . lip balm  1 application Topical BID   . loperamide  2 mg Oral QHS  . octreotide  150 mcg Subcutaneous TID  . OxyCODONE  15 mg Oral Q12H  . potassium chloride  40 mEq Oral Daily  . saccharomyces boulardii  250 mg Oral BID  . sodium chloride  3 mL Intravenous Q12H   Continuous Infusions: . sodium chloride 50 mL/hr at 08/31/13 0410  . TPN (CLINIMIX) Adult without lytes 125 mL/hr at 08/30/13 1816   And  . fat emulsion 250 mL (08/30/13 1815)  . [name] KitchenTPN (CLINIMIX-E) Adult     And  . fat emulsion       Kurt Klammer, MD  [facility] Pager [phone]  If 7PM-7AM, please contact night-coverage [email or web address] Password [email or web address] 08/31/2013, 3:58 PM   LOS: 20 days

## 2013-08-31 NOTE — Progress Notes (Signed)
Dressing changed to PICC line.  Noting redness at site extending 2cm beyond biopatch.  No drainage or tenderness

## 2013-08-31 NOTE — Progress Notes (Addendum)
Pt sitting up +7L I&O but -3kg & inc Cr yesterday - confusing Modified Eakins w suction in place. - no cellulitis.  R lateral wound closing Wife w pt in room.  RN/CNA in room.  -continue wound care with suctioned pouching system to control SB fistula - challenge for transition to outpt -TNA for high output SB fistula -add imodium to slow output of fistula - start w QHS -but Lasix from BID to daily to avoid dehydration -can try Sandostatin but not good data for SB fistula (better for pancreatic fistula) -d/c Abx - cellulitis resolved & 2 weeks done per ID.  I do not see a need to CT unless worsened since was extraperitoneal abscess & drained -d/c toradol/vanco w inc Cr  -doubt SB fistula will close w/o surgery.  D/w CCS partners.  Agree w waiting until 6 months before attempting this - SB resection of fistulas would be needed.  Primary repair only will fail.  Massive fascial defect = need for major abd wall reconstruction probably w bilateral component separation & giant biologic retrorectal/preoperitoneal mesh underlay.  Reserve polypropylene mesh for recurrence after fistula closed/healed.  Best managed at [employer] [facility] [place], [facility], [facility], etc) with regular experience with this.  Dr Elesa Hacker (original surgeon) was notified by me in person.  I updated the patient's status to the pt & family.  Recommendations were made.  Questions were answered.  They expressed understanding & appreciation.

## 2013-09-01 LAB — BASIC METABOLIC PANEL
BUN: 32 mg/dL — ABNORMAL HIGH (ref 6–23)
CO2: 29 mEq/L (ref 19–32)
Calcium: 8.8 mg/dL (ref 8.4–10.5)
Creatinine, Ser: 1.35 mg/dL (ref 0.50–1.35)
GFR calc Af Amer: 64 mL/min — ABNORMAL LOW (ref >90)
GFR calc non Af Amer: 56 mL/min — ABNORMAL LOW (ref >90)
Glucose, Bld: 56 mg/dL — ABNORMAL LOW (ref 70–99)
Potassium: 3.6 mEq/L (ref 3.5–5.1)

## 2013-09-01 LAB — CBC
HCT: 33.9 % — ABNORMAL LOW (ref 39.0–52.0)
MCHC: 33.6 g/dL (ref 30.0–36.0)
MCV: 93.6 fL (ref 78.0–100.0)
Platelets: 269 10*3/uL (ref 150–400)
RDW: 13.7 % (ref 11.5–15.5)

## 2013-09-01 LAB — GLUCOSE, CAPILLARY
Glucose-Capillary: 33 mg/dL — CL (ref 70–99)
Glucose-Capillary: 34 mg/dL — CL (ref 70–99)

## 2013-09-01 MED ORDER — FAT EMULSION 20 % IV EMUL
250.0000 mL | INTRAVENOUS | Status: AC
  Administered 2013-09-01: 250 mL via INTRAVENOUS
  Filled 2013-09-01: qty 250

## 2013-09-01 MED ORDER — DEXTROSE 50 % IV SOLN
INTRAVENOUS | Status: AC
  Administered 2013-09-01: 05:00:00
  Filled 2013-09-01: qty 50

## 2013-09-01 MED ORDER — DEXTROSE 50 % IV SOLN
25.0000 mL | Freq: Once | INTRAVENOUS | Status: AC | PRN
  Administered 2013-09-01: 25 mL via INTRAVENOUS
  Filled 2013-09-01: qty 50

## 2013-09-01 MED ORDER — TRACE MINERALS CR-CU-F-FE-I-MN-MO-SE-ZN IV SOLN
INTRAVENOUS | Status: AC
  Administered 2013-09-01: 17:00:00 via INTRAVENOUS
  Filled 2013-09-01: qty 3000

## 2013-09-01 MED ORDER — DEXTROSE 10 % IV SOLN
INTRAVENOUS | Status: AC
  Administered 2013-09-01: 06:00:00 via INTRAVENOUS
  Filled 2013-09-01: qty 1000

## 2013-09-01 MED ORDER — SODIUM CHLORIDE 0.9 % IV SOLN
INTRAVENOUS | Status: DC
  Administered 2013-09-01 – 2013-09-07 (×5): via INTRAVENOUS
  Administered 2013-09-10 – 2013-09-14 (×2): 20 mL/h via INTRAVENOUS
  Administered 2013-09-16: 06:00:00 via INTRAVENOUS
  Administered 2013-09-18: 07:00:00 20 mL/h via INTRAVENOUS
  Administered 2013-09-20 – 2013-10-03 (×4): via INTRAVENOUS
  Administered 2013-10-05 – 2013-10-12 (×4): 20 mL/h via INTRAVENOUS
  Administered 2013-10-14: 20:00:00 via INTRAVENOUS

## 2013-09-01 NOTE — Progress Notes (Signed)
18 Days Post-Op  Subjective: No complaints happy Kurt Cross is draining well.  Objective: Vital signs in last 24 hours: Temp:  [97.6 F (36.4 C)-98.5 F (36.9 C)] 98 F (36.7 C) (11/26 0427) Pulse Rate:  [59-70] 62 (11/26 0427) Resp:  [18-21] 21 (11/26 0427) BP: (127-148)/(62-96) 148/96 mmHg (11/26 0427) SpO2:  [99 %-100 %] 100 % (11/26 0427) Weight:  [149.9 kg (330 lb 7.5 oz)] 149.9 kg (330 lb 7.5 oz) (11/26 0427) Last BM Date: 08/24/13 Afebrile, VSS Labs OK  Intake/Output from previous day: 11/25 0701 - 11/26 0700 In: 3177.5 [I.V.:1187.5; TPN:1990] Out: 2550 [Urine:2100; Drains:450] Intake/Output this shift:    General appearance: alert, cooperative and no distress GI: open wound withEakins and red robbin catheter draining secretions  Lab Results:   Recent Labs  08/31/13 0650 09/01/13 0515  WBC 7.8 8.4  HGB 11.4* 11.4*  HCT 33.3* 33.9*  PLT 237 269    BMET  Recent Labs  08/31/13 0650 09/01/13 0515  NA 134* 139  K 3.6 3.6  CL 97 101  CO2 30 29  GLUCOSE 107* 56*  BUN 40* 32*  CREATININE 1.66* 1.35  CALCIUM 8.7 8.8   PT/INR No results found for this basename: LABPROT, INR,  in the last 72 hours   Recent Labs Lab 08/26/13 0517 08/30/13 0500  AST 14 17  ALT 10 9  ALKPHOS 29* 40  BILITOT 0.5 0.6  PROT 7.1 7.1  ALBUMIN 2.1* 2.0*     Lipase  No results found for this basename: lipase     Studies/Results: No results found.  Medications: . acetaminophen  1,000 mg Oral TID  . antiseptic oral rinse  15 mL Mouth Rinse q12n4p  . apixaban  5 mg Oral BID  . atorvastatin  40 mg Oral QODAY  . carvedilol  3.125 mg Oral BID WC  . furosemide  40 mg Oral Daily  . insulin aspart  0-20 Units Subcutaneous Q4H  . insulin glargine  65 Units Subcutaneous QHS  . lip balm  1 application Topical BID  . loperamide  2 mg Oral QHS  . octreotide  150 mcg Subcutaneous TID  . OxyCODONE  15 mg Oral Q12H  . potassium chloride  40 mEq Oral Daily  . saccharomyces  boulardii  250 mg Oral BID  . sodium chloride  3 mL Intravenous Q12H    Assessment/Plan Infected prosthetic mesh of abdominal wall with GIANT abscess s/p removal 08/14/2013 Dr. Michael Boston Day 18-  Vancomycin and Zosyn Entero-atmospheric fistula-on bowel rest, Sandostatin and TPN; fistula output has significantly increased since the drainage system was fixed.  Diabetes mellitus, insulin dependent (IDDM), uncontrolled  HYPERLIPIDEMIA  Obesity, Class III, BMI 40-49.9 (morbid obesity)  OBSTRUCTIVE SLEEP APNEA  Acute renal insufficiency from fistula/wound loss-improved   Plan:  Continue TNA, Eakins, and I will check on how long we are going to continue antibiotics.   LOS: 21 days    Kurt Cross 09/01/2013

## 2013-09-01 NOTE — Progress Notes (Signed)
At [place], patient's BS check was done and it was 33. Patient was awake and sweating. Patient given 25 of dextrose. IV RN came in and then noticed that the TNA had come unhooked from the patient's PICC and was on the floor. RN called pharmacy and pharmacy ordered D10 to be hung. Will continue to monitor patient.

## 2013-09-01 NOTE — Progress Notes (Signed)
General surgery attending note:  I personally interviewed and examined this patient this morning. I agree with the assessment and treatment plan outlined by Mr. Creig Hines, [place].   Kurt Cross. Dalbert Batman, M.D., [facility] General and Minimally invasive Surgery Breast and Colorectal Surgery Office:   [phone] Pager:   [phone]

## 2013-09-01 NOTE — Progress Notes (Signed)
Patient ID: Kurt Cross, male   DOB: 1951/05/15, 62 y.o.   MRN: [id number]  [facility] PROGRESS NOTE  Kurt Cross [id number] DOB: 11-06-1950 DOA: 08/11/2013 PCP: [name], MD  Brief narrative: 62 y.o. male with a remote history of bowel perforation requiring exploratory laparotomy and temporary colostomy. He then developed a ventral hernia and underwent mesh repair decades ago. He was recently hospitalized and treated for possible pneumonia. He had a fall at home recently and injured his right lower leg leading to readmission. He was noted to be febrile with some abdominal wall swelling. CT scan revealed a large abdominal wall abscess. 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 He was placed on bowel rest and started on TPN. Early in the admission, the patient was fluid overloaded and received intravenous furosemide. He has been since transitioned back to his home dose of oral furosemide and remains clinically stable from CHF standpoint.   Assessment/Plan:  #1 infected prosthetic Gore-Tex mesh with a giant abdominal abscess status post IandD and Gore-Tex mesh removal 08/14/2013 now with enterocut fistula  - Patient is status post incision and drainage of abscess, Gore-Tex mesh removal, debridement of abdominal wall and peritoneum 08/14/2013.  - empiric IV Vancomycin and Zosyn discontinued per CCS.  - Blood cultures with no growth to date  - now with EC fistula-->started on sandostatin by surgery 08/23/13 and dose increased.  - wound care and TNA per surgery  - abx per ID  - Continue long acting oxycontin 15mg  q 12hrs for pain control  - Continue sandostatin, TNA, supportiver care. Bowel rest.Per CCS  #2 sepsis  - secondary to problem #1.  - Wound cultures prelim with GPC in pairs and clusters-->Coag Neg Staph  #3 hypotension  - resolved  - reasonable inpatient control, continue  Coreg #4 atrial fibrillation/atrial flutter  - secondary to problem #1.  - currently rate controlled. Initial EKG showed flutter waves. Coreg for rate control  - Cardiac enzymes have been negative x3. 2-D echo with EF of 50-55%.  - CHADSVASc score is 4 (not ASVD plaque on abd aorta).  - d/c [place] lovenox as pt now has blood from ventral wound  - scheduled for TEE cardioversion however in light of problem #1 this was cancelled  - will need F/u with cardiology 2-3 weeks post discharge - resumed eliquis  #5 chronic kidney disease stage III  - Creatinine is trending down and is within normal limits this AM. ACE inhibitor had been discontinued.  #6 leukocytosis/fever  - secondary to problem #1.  - resolved and WBC is within normal limits #7 chronic combined systolic and diastolic CHF  - Initial CXR with volume overload. given 4 doses of lasix 40mg  IV  -08/12/2013 echocardiogram shows EF 50-55%--EF improved compared to one year ago (35-40%)  - weight trending down 356 lbs --> 248 --> 333 --> 330 this AM #8 Uncontrolled Pain  - Continue long acting OxyContin 15 mg twice a day  - Continue OxyIR to 15 mg every 4 hours prn breakthrough pain  #9 hyperlipidemia  - Continue statin.  #10 4.5 cm ascending aortic aneurysm per CT scan of 08/01/2013  - Stable. Asymptomatic. Continue risk factor modification with good blood pressure control.  - Will need outpatient followup with vascular surgery as outpatient.  #11 type 2 diabetes  - Hemoglobin A1c was 9.5 on 08/02/2013.Continue Lantus 65 units  daily. - Sliding scale insulin.  #12. Hypomagnesemia  - Repleted  #13 ?? Pauses  - Patient asymptomatic. If HR decreases will decrease coreg. Cardiology notified and ICD has been interrogated.   Family Communication: Updated patient, no family at beside  Disposition Plan: Home when cleared by surgery   Antibiotics   Vancomycin 08/15/13>>> 08/31/13    Zosyn 08/14/13>>>08/31/13  Procedures/Studies:   Ct  Abdomen Pelvis Wo Contrast  08/14/2013 14.5 x 18.1 x 26.1 cm fluid collection/abscess within the anterior abdominal wall, as described above, incompletely visualized. The collection appears just anterior to prior ventral hernia mesh. Given the size and proximity to the skin surface, incision and drainage is suggested.  Dg Chest 2 View 08/01/2013 No active cardiopulmonary disease.  Dg Hip Complete Right   08/11/2013 No acute osseous injury of the right hip.   Ct Angio Chest Pe W/cm &/or Wo Cm  08/01/2013  4.5 cm AAA without complicating features.  No evidence of acute pulmonary embolism or aortic dissection.   Dg Chest Port 1 View   08/18/2013 Right upper extremity PICC terminates in the mid superior vena cava. Stable congestive heart failure pattern.   Dg Chest Port 1 View  08/17/2013 Stable chest with changes of congestive heart failure with pulmonary interstitial edema. Cardiac pacer in stable position.   Dg Chest Portable 1 View 08/15/2013 Stable cardiomegaly with interval worsening of pulmonary vascular congestion/ edema relative to 08/13/2013.   Dg Chest Port 1 View   08/13/2013 No acute findings. Stable cardiomegaly and pulmonary venous prominence without overt edema.  2-D echo 08/12/2013  Incision and drainage of abscess, Gore-Tex mesh removal, debridement of abdominal wall and peritoneum Consultants:   Cardiology: Dr. Terrence Dupont 08/13/2013   General surgery: Dr. Johney Maine 08/14/2013   ID: Dr Megan Salon 08/15/13  HPI/Subjective: No events overnight.   Objective: Filed Vitals:   08/31/13 1410 08/31/13 2034 08/31/13 2225 09/01/13 0427  BP: 127/62 136/76  148/96  Pulse: 70 59 63 62  Temp: 97.6 F (36.4 C) 98.5 F (36.9 C)  98 F (36.7 C)  TempSrc: Oral Oral  Oral  Resp: 19 18 18 21   Height:      Weight:    149.9 kg (330 lb 7.5 oz)  SpO2: 100% 99% 100% 100%    Intake/Output Summary (Last 24 hours) at 09/01/13 1320 Last data filed at 09/01/13 0856  Gross per 24 hour  Intake  3177.5 ml  Output   1750 ml  Net 1427.5 ml    Exam:   General:  Pt is alert, follows commands appropriately, not in acute distress  Cardiovascular: irregular rate and rhythm, no rubs, no gallops  Respiratory: Clear to auscultation bilaterally, no wheezing, no crackles, no rhonchi  Abdomen: open abdominal wound  Extremities: No edema, pulses DP and PT palpable bilaterally  Data Reviewed: Basic Metabolic Panel:  Recent Labs Lab 08/26/13 0517 08/27/13 0550 08/28/13 0420 08/29/13 0500 08/30/13 0500 08/31/13 0650 09/01/13 0515  NA 137 132* 134* 135 137 134* 139  K 3.3* 3.4* 3.2* 3.4* 3.4* 3.6 3.6  CL 95* 95* 94* 95* 96 97 101  CO2 32 32 31 33* 32 30 29  GLUCOSE 98 122* 96 86 94 107* 56*  BUN 28* 30* 31* 30* 37* 40* 32*  CREATININE 1.31 1.37* 1.34 1.35 1.63* 1.66* 1.35  CALCIUM 9.1 8.7 8.9 8.9 8.8 8.7 8.8  MG 1.8  --   --   --  1.9  --   --   PHOS 5.0* 3.9 3.6  --  5.4* 4.0  --    Liver Function Tests:  Recent Labs Lab 08/26/13 0517 08/30/13 0500  AST 14 17  ALT 10 9  ALKPHOS 29* 40  BILITOT 0.5 0.6  PROT 7.1 7.1  ALBUMIN 2.1* 2.0*   CBC:  Recent Labs Lab 08/28/13 0420 08/29/13 0500 08/30/13 0500 08/31/13 0650 09/01/13 0515  WBC 9.2 8.5 7.6 7.8 8.4  NEUTROABS  --   --  4.1  --   --   HGB 11.9* 11.7* 11.4* 11.4* 11.4*  HCT 36.0* 33.9* 33.8* 33.3* 33.9*  MCV 95.2 95.0 94.7 94.3 93.6  PLT 280 266 264 237 269   CBG:  Recent Labs Lab 09/01/13 0425 09/01/13 0510 09/01/13 0613 09/01/13 0759 09/01/13 1202  GLUCAP 33* 49* 98 118* 76   Scheduled Meds: . acetaminophen  1,000 mg Oral TID  . apixaban  5 mg Oral BID  . atorvastatin  40 mg Oral QODAY  . carvedilol  3.125 mg Oral BID WC  . furosemide  40 mg Oral Daily  . insulin aspart  0-20 Units Subcutaneous Q4H  . insulin glargine  65 Units Subcutaneous QHS  . loperamide  2 mg Oral QHS  . octreotide  150 mcg Subcutaneous TID  . OxyCODONE  15 mg Oral Q12H  . potassium chloride  40 mEq Oral Daily   . saccharomyces boulardii  250 mg Oral BID   Continuous Infusions: . sodium chloride 20 mL/hr at 09/01/13 0837  . dextrose 125 mL/hr at 09/01/13 0544  . [name] KitchenTPN (CLINIMIX-E) Adult     And  . fat emulsion      Faye Ramsay, MD  [facility] Pager [phone]  If 7PM-7AM, please contact night-coverage [email or web address] Password [email or web address] 09/01/2013, 1:20 PM   LOS: 21 days

## 2013-09-01 NOTE — Progress Notes (Signed)
Inpatient Diabetes Program Recommendations  AACE/ADA: New Consensus Statement on Inpatient Glycemic Control (2013)  Target Ranges:  Prepandial:   less than 140 mg/dL      Peak postprandial:   less than 180 mg/dL (1-2 hours)      Critically ill patients:  140 - 180 mg/dL     Results for SHASTA, CAPSTICK (MRN [id number]) as of 09/01/2013 08:21  Ref. Range 09/01/2013 00:17 09/01/2013 04:25 09/01/2013 05:10 09/01/2013 06:13 09/01/2013 07:59  Glucose-Capillary Latest Range: 70-99 mg/dL 114 (H) 33 (LL) 49 (L) 98 118 (H)    **Noted patient hypoglycemic this morning.  RN notes from this morning states that at ~4:25am, it was discovered that patient's TPN line was unhooked from patient's PICC and was on the floor.  This may likely have been the culprit of patient's hypoglycemia as patient was not receiving TPN and had received Lantus earlier in the night.  Noted pharmacy started D10% infusion.  Will follow. Wyn Quaker RN, MSN, CDE Diabetes Coordinator Inpatient Diabetes Program Team Pager: [phone] (8a-10p)

## 2013-09-01 NOTE — Progress Notes (Addendum)
09/01/2013 17:21 Wendi Maya RN [phone] On Call CM  Received call from [place] at [facility] at 17:20 stating, that [facility] has approved LTAC  at [facility] in [place].  [id number]. CM will follow up on Friday.

## 2013-09-01 NOTE — Progress Notes (Signed)
PARENTERAL NUTRITION CONSULT NOTE - FOLLOW UP  Pharmacy Consult for TNA Indication: Enterocutaneous fistula  No Known Allergies  Patient Measurements: Height: 6' (182.9 cm) Weight: 330 lb 7.5 oz (149.9 kg) IBW/kg (Calculated) : 77.6 Adjusted Body Weight: 102.6kg  Vital Signs: Temp: 98 F (36.7 C) (11/26 0427) Temp src: Oral (11/26 0427) BP: 148/96 mmHg (11/26 0427) Pulse Rate: 62 (11/26 0427) Intake/Output from previous day: 11/25 0701 - 11/26 0700 In: 3177.5 [I.V.:1187.5; TPN:1990] Out: 2550 [Urine:2100; Drains:450]  Labs:  Recent Labs  08/30/13 0500 08/31/13 0650 09/01/13 0515  WBC 7.6 7.8 8.4  HGB 11.4* 11.4* 11.4*  HCT 33.8* 33.3* 33.9*  PLT 264 237 269    Recent Labs  08/30/13 0500 08/30/13 0502 08/31/13 0650 09/01/13 0515  NA 137  --  134* 139  K 3.4*  --  3.6 3.6  CL 96  --  97 101  CO2 32  --  30 29  GLUCOSE 94  --  107* 56*  BUN 37*  --  40* 32*  CREATININE 1.63*  --  1.66* 1.35  CALCIUM 8.8  --  8.7 8.8  MG 1.9  --   --   --   PHOS 5.4*  --  4.0  --   PROT 7.1  --   --   --   ALBUMIN 2.0*  --   --   --   AST 17  --   --   --   ALT 9  --   --   --   ALKPHOS 40  --   --   --   BILITOT 0.6  --   --   --   PREALBUMIN 11.2*  --   --   --   TRIG  --  99  --   --    Estimated Creatinine Clearance: 87.7 ml/min (by C-G formula based on Cr of 1.35).    Recent Labs  09/01/13 0425 09/01/13 0510 09/01/13 0613  GLUCAP 33* 49* 98    Medications:  Infusions:  . sodium chloride 50 mL/hr at 09/01/13 0544  . dextrose 125 mL/hr at 09/01/13 0544  . [name] KitchenTPN (CLINIMIX-E) Adult Stopped (09/01/13 0434)   And  . fat emulsion Stopped (09/01/13 0434)    Insulin Requirements in the past 24 hours:   Lantus 65units sq qHS   3 units SSI required  - CBG q4h with resistant scale coverage  50 units regular insulin added to TNA   Nutritional Goals:   Per RD 11/13:  2400-2700 kcal/day, 160-180 grams/day protein, 3.3-3.5 L/day fluid  Clinimix E5/15 at  goal rate 125 ml/hr (3L/day) with 20% lipids at 10 ml/hr to provide 150g protein and 2675 kcal daily.  Due to how much fluid can be placed in TNA bag, Max volume is 3L.   Current Nutrition:   NPO  Clinimix 5/15 at 125 ml/hr and 20% lipids at 63ml/hr   IVF: NS at [place]: 62 yo male with large abdominal abscess with infected old Gore Tex mesh s/p I&D abscess, removal of mesh, debridement of abdominal wall and peritoneum. 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 TNA started 11/12 at 10pm.   Glucose: Hx DM on insulin PTA, CBG improved with addition of insulin to TNA 11/16 and increased Lantus doses.   CBGs decreased this morning after TPN found to be disconnected - D10W started  Electrolytes: Na low (resolved - ? D/t TPN off), K wnl after supplementation  SCr: 1.35 (improved), on lasix 40mg   IV daily on KCl 85meq daily, net I/O (24h) = +958ml  LFTs: wnl, albumin only 2 (11/24)  TGs: wnl, 124 (11/13), 89 (11/17), 99 (11/24)  Prealbumin: low, 10.3 (11/13), 11.1 (11/17), 11.2(11/24)  Monitor fluid status very closely since TNA at high goal rate (TNA + lipids + IVF = 155 ml/hr).  I/Os documented, but unsure of accuracy of documentation.  Octreotide started 11/17 to control fistula output. Lasix decreased from 80mg  BID to 40mg  BID on 11/24 due to rising SCr (watch K+ since remains on same KCl supplement with dec loop dose).   TPN Access: Triple lumen PICC placed 11/12 TPN day#: 15  Plan:   Resume Clinimix E 5/15 at goal rate of 134ml/hr + lipids 20% 43ml/hr  Run D10W at 168ml/hr until able to hang new TPN this evening (TPN found to be disconnected from tubing this morning and thus d/c'd)  Watch CBGs while TPN off   Continue insulin 50 units per 3L bag (16.7 units/L)  Continue Lantus 65 units qhs  KCl 18mEq PO daily per MD  TNA to contain standard multivitamins and trace elements.  Reduce NS to [facility]  TNA lab panels on Mondays & Thursdays.  BMET and Phos ordered  for AM labs.  F/u clinical progress  Doreene Eland, PharmD, BCPS.   Pager: [phone] 09/01/2013 7:22 AM

## 2013-09-01 NOTE — Progress Notes (Signed)
Placed pt. On cpap. Pt. Is tolerating well at this time.  

## 2013-09-01 NOTE — Progress Notes (Signed)
After the D10 was started patient's BS went up to 98. Will continue to monitor.

## 2013-09-01 NOTE — Consult Note (Signed)
WOC wound follow up Wound type: EC fistula. With Eakins in place, LWS continuous with red robinson in the pouch.  Now with stopcock to allow pt to dc suction and ambulate more. He is happy with current pouching system, however I have explained the complexity of the spout being on the side of the pouch should he go home with out suction.  I have left one of the Coloplast pouches in the room for the patient and his wife to look at again which would allow him to empty the pouch if intermittent home suction can not be obtained. [name] feels that the use of intermittent suction at home would be the best way to manage his fistula and pouching system. Current Eakin in place and pt really does not wish to have it changed today, Seal intact no leakage noted at the perimeter of the wound pouch.  Suction intact. Drainage (amount, consistency, odor) liquid brown-green enteric contents  WOC team remains available to assist with fistula management as needed. Kurt Risden [place] RN,CWOCN [phone]

## 2013-09-02 LAB — CBC
HCT: 33.6 % — ABNORMAL LOW (ref 39.0–52.0)
MCH: 31.7 pg (ref 26.0–34.0)
MCHC: 33.6 g/dL (ref 30.0–36.0)
Platelets: 272 10*3/uL (ref 150–400)

## 2013-09-02 LAB — GLUCOSE, CAPILLARY: Glucose-Capillary: 139 mg/dL — ABNORMAL HIGH (ref 70–99)

## 2013-09-02 LAB — COMPREHENSIVE METABOLIC PANEL
AST: 21 U/L (ref 0–37)
Alkaline Phosphatase: 48 U/L (ref 39–117)
BUN: 25 mg/dL — ABNORMAL HIGH (ref 6–23)
CO2: 28 mEq/L (ref 19–32)
Chloride: 99 mEq/L (ref 96–112)
GFR calc Af Amer: 76 mL/min — ABNORMAL LOW (ref >90)
GFR calc non Af Amer: 65 mL/min — ABNORMAL LOW (ref >90)
Glucose, Bld: 101 mg/dL — ABNORMAL HIGH (ref 70–99)
Potassium: 3.9 mEq/L (ref 3.5–5.1)
Sodium: 135 mEq/L (ref 135–145)
Total Bilirubin: 0.8 mg/dL (ref 0.3–1.2)

## 2013-09-02 LAB — MAGNESIUM: Magnesium: 1.9 mg/dL (ref 1.5–2.5)

## 2013-09-02 MED ORDER — FAT EMULSION 20 % IV EMUL
250.0000 mL | INTRAVENOUS | Status: AC
  Administered 2013-09-02: 250 mL via INTRAVENOUS
  Filled 2013-09-02: qty 250

## 2013-09-02 MED ORDER — HYDRALAZINE HCL 20 MG/ML IJ SOLN
5.0000 mg | Freq: Three times a day (TID) | INTRAMUSCULAR | Status: DC
  Administered 2013-09-02 – 2013-09-07 (×16): 5 mg via INTRAVENOUS
  Filled 2013-09-02 (×2): qty 0.25
  Filled 2013-09-02: qty 1
  Filled 2013-09-02: qty 0.25
  Filled 2013-09-02: qty 1
  Filled 2013-09-02 (×7): qty 0.25
  Filled 2013-09-02: qty 1
  Filled 2013-09-02 (×2): qty 0.25
  Filled 2013-09-02: qty 1
  Filled 2013-09-02 (×3): qty 0.25

## 2013-09-02 MED ORDER — TRACE MINERALS CR-CU-F-FE-I-MN-MO-SE-ZN IV SOLN
INTRAVENOUS | Status: AC
  Administered 2013-09-02: 18:00:00 via INTRAVENOUS
  Filled 2013-09-02: qty 3000

## 2013-09-02 NOTE — Progress Notes (Signed)
Patient ID: Kurt Cross, male   DOB: [birth year], 62 y.o.   MRN: [id number]  [facility] PROGRESS NOTE  Kurt Cross [id number] DOB: 14-Nov-1950 DOA: 08/11/2013 PCP: [name], MD  Brief narrative:  62 y.o. male with a remote history of bowel perforation requiring exploratory laparotomy and temporary colostomy. He then developed a ventral hernia and underwent mesh repair decades ago. He was recently hospitalized and treated for possible pneumonia. He had a fall at home recently and injured his right lower leg leading to readmission. He was noted to be febrile with some abdominal wall swelling. CT scan revealed a large abdominal wall abscess. 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 He was placed on bowel rest and started on TPN. Early in the admission, the patient was fluid overloaded and received intravenous furosemide. He has been since transitioned back to his home dose of oral furosemide and remains clinically stable from CHF standpoint.   Assessment/Plan:  #1 infected prosthetic Gore-Tex mesh with a giant abdominal abscess status post IandD and Gore-Tex mesh removal 08/14/2013 now with enterocut fistula  - Patient is status post incision and drainage of abscess, Gore-Tex mesh removal, debridement of abdominal wall and peritoneum 08/14/2013.  - empiric IV Vancomycin and Zosyn discontinued per CCS.  - Blood cultures with no growth to date  - now with EC fistula-->started on sandostatin by surgery 08/23/13 and dose increased.  - wound care and TNA per surgery  - abx per ID  - Continue long acting oxycontin 15mg  q 12hrs for pain control  - Continue sandostatin, TNA, supportiver care. Bowel rest.Per CCS  #2 sepsis  - secondary to problem #1.  - Wound cultures prelim with GPC in pairs and clusters-->Coag Neg Staph  #3 hypotension  - resolved  - now elevated BP and SBP in 160 -180 - will  continue Coreg, add Hydralazine  #4 atrial fibrillation/atrial flutter  - secondary to problem #1.  - currently rate controlled. Initial EKG showed flutter waves. Coreg for rate control  - Cardiac enzymes have been negative x3. 2-D echo with EF of 50-55%.  - CHADSVASc score is 4 (not ASVD plaque on abd aorta).  - scheduled for TEE cardioversion however in light of problem #1 this was cancelled  - will need F/u with cardiology 2-3 weeks post discharge  - resumed eliquis  #5 chronic kidney disease stage III  - Creatinine is trending down and is within normal limits this AM. ACE inhibitor had been discontinued.  #6 leukocytosis/fever  - secondary to problem #1 - pt remains afebrile over 48 hours  - resolved and WBC is within normal limits  #7 chronic combined systolic and diastolic CHF  - Initial CXR with volume overload. given 4 doses of lasix 40mg  IV  -08/12/2013 echocardiogram shows EF 50-55%--EF improved compared to one year ago (35-40%)  - weight trending down 356 lbs --> 248 --> 333 --> 330 this AM  #8 Uncontrolled Pain  - Continue long acting OxyContin 15 mg twice a day  - Continue OxyIR to 15 mg every 4 hours prn breakthrough pain  #9 hyperlipidemia  - Continue statin.  #10 4.5 cm ascending aortic aneurysm per CT scan of 08/01/2013  - Stable. Asymptomatic. Continue risk factor modification with good blood pressure control.  - Will need outpatient followup with vascular surgery as outpatient.  #11 type 2 diabetes  - Hemoglobin  A1c was 9.5 on 08/02/2013.Continue Lantus 65 units daily.  - Sliding scale insulin.  #12. Hypomagnesemia  - Repleted  #13 ?? Pauses  - Patient asymptomatic. If HR decreases will decrease coreg. Cardiology notified and ICD has been interrogated.   Family Communication: Updated patient, no family at beside  Disposition Plan: Home when cleared by surgery   Antibiotics  Vancomycin 08/15/13>>> 08/31/13  Zosyn 08/14/13>>>08/31/13  Procedures/Studies:  Ct  Abdomen Pelvis Wo Contrast 08/14/2013 14.5 x 18.1 x 26.1 cm fluid collection/abscess within the anterior abdominal wall, as described above, incompletely visualized. The collection appears just anterior to prior ventral hernia mesh. Given the size and proximity to the skin surface, incision and drainage is suggested.  Dg Chest 2 View 08/01/2013 No active cardiopulmonary disease.  Dg Hip Complete Right 08/11/2013 No acute osseous injury of the right hip.  Ct Angio Chest Pe W/cm &/or Wo Cm 08/01/2013 4.5 cm AAA without complicating features. No evidence of acute pulmonary embolism or aortic dissection.  Dg Chest Port 1 View 08/18/2013 Right upper extremity PICC terminates in the mid superior vena cava. Stable congestive heart failure pattern.  Dg Chest Port 1 View 08/17/2013 Stable chest with changes of congestive heart failure with pulmonary interstitial edema. Cardiac pacer in stable position.  Dg Chest Portable 1 View 08/15/2013 Stable cardiomegaly with interval worsening of pulmonary vascular congestion/ edema relative to 08/13/2013.  Dg Chest Port 1 View 08/13/2013 No acute findings. Stable cardiomegaly and pulmonary venous prominence without overt edema. 2-D echo 08/12/2013  Incision and drainage of abscess, Gore-Tex mesh removal, debridement of abdominal wall and peritoneum Consultants:  Cardiology: Dr. Terrence Dupont 08/13/2013  General surgery: Dr. Johney Maine 08/14/2013  ID: Dr Megan Salon 08/15/13  HPI/Subjective: No events overnight.   Objective: Filed Vitals:   09/01/13 1506 09/01/13 2007 09/01/13 2158 09/02/13 0417  BP: 140/69 169/81  180/87  Pulse: 62 61 99 67  Temp: 97.8 F (36.6 C) 98.5 F (36.9 C)  99.2 F (37.3 C)  TempSrc: Oral Oral  Axillary  Resp: 18 18 18 18   Height:      Weight:    149.8 kg (330 lb 4 oz)  SpO2: 100% 98% 98% 97%    Intake/Output Summary (Last 24 hours) at 09/02/13 0759 Last data filed at 09/02/13 0656  Gross per 24 hour  Intake 3029.67 ml  Output   2600 ml  Net  429.67 ml    Exam:   General:  Pt is alert, follows commands appropriately, not in acute distress  Cardiovascular: Regular rate and rhythm, S1/S2, no murmurs, no rubs, no gallops  Respiratory: Clear to auscultation bilaterally, no wheezing, no crackles, no rhonchi  Abdomen: Soft, open abdominal wound and pt showing signs of discomfort with movement, overall now very tender   Extremities: No edema, pulses DP and PT palpable bilaterally  Neuro: Grossly nonfocal  Data Reviewed: Basic Metabolic Panel:  Recent Labs Lab 08/27/13 0550 08/28/13 0420 08/29/13 0500 08/30/13 0500 08/31/13 0650 09/01/13 0515 09/02/13 0445  NA 132* 134* 135 137 134* 139 135  K 3.4* 3.2* 3.4* 3.4* 3.6 3.6 3.9  CL 95* 94* 95* 96 97 101 99  CO2 32 31 33* 32 30 29 28   GLUCOSE 122* 96 86 94 107* 56* 101*  BUN 30* 31* 30* 37* 40* 32* 25*  CREATININE 1.37* 1.34 1.35 1.63* 1.66* 1.35 1.18  CALCIUM 8.7 8.9 8.9 8.8 8.7 8.8 9.1  MG  --   --   --  1.9  --   --  1.9  PHOS 3.9 3.6  --  5.4* 4.0  --  3.2   Liver Function Tests:  Recent Labs Lab 08/30/13 0500 09/02/13 0445  AST 17 21  ALT 9 12  ALKPHOS 40 48  BILITOT 0.6 0.8  PROT 7.1 7.3  ALBUMIN 2.0* 2.1*   CBC:  Recent Labs Lab 08/29/13 0500 08/30/13 0500 08/31/13 0650 09/01/13 0515 09/02/13 0445  WBC 8.5 7.6 7.8 8.4 7.5  NEUTROABS  --  4.1  --   --   --   HGB 11.7* 11.4* 11.4* 11.4* 11.3*  HCT 33.9* 33.8* 33.3* 33.9* 33.6*  MCV 95.0 94.7 94.3 93.6 94.1  PLT 266 264 237 269 272   CBG:  Recent Labs Lab 09/01/13 1739 09/01/13 2003 09/02/13 0048 09/02/13 0415 09/02/13 0746  GLUCAP 97 159* 158* 109* 127*    Scheduled Meds: . acetaminophen  1,000 mg Oral TID  . antiseptic oral rinse  15 mL Mouth Rinse q12n4p  . apixaban  5 mg Oral BID  . atorvastatin  40 mg Oral QODAY  . carvedilol  3.125 mg Oral BID WC  . furosemide  40 mg Oral Daily  . insulin aspart  0-20 Units Subcutaneous Q4H  . insulin glargine  65 Units Subcutaneous  QHS  . lip balm  1 application Topical BID  . loperamide  2 mg Oral QHS  . octreotide  150 mcg Subcutaneous TID  . OxyCODONE  15 mg Oral Q12H  . potassium chloride  40 mEq Oral Daily  . saccharomyces boulardii  250 mg Oral BID  . sodium chloride  3 mL Intravenous Q12H   Continuous Infusions: . sodium chloride 20 mL/hr at 09/01/13 0837  . [name] KitchenTPN (CLINIMIX-E) Adult 125 mL/hr at 09/01/13 2300   And  . fat emulsion 250 mL (09/01/13 2300)  . [name] KitchenTPN (CLINIMIX-E) Adult     And  . fat emulsion     Faye Ramsay, MD  [facility] Pager [phone]  If 7PM-7AM, please contact night-coverage [email or web address] Password [email or web address] 09/02/2013, 7:59 AM   LOS: 22 days

## 2013-09-02 NOTE — Progress Notes (Signed)
Patient ID: Kurt Cross, male   DOB: 1951-02-26, 62 y.o.   MRN: [id number]  General Surgery - [facility] - Progress Note  POD# 19  Subjective: Patient up in chair.  No complaints.  Wound dressing to suction.  On TNA  Objective: Vital signs in last 24 hours: Temp:  [97.8 F (36.6 C)-99.2 F (37.3 C)] 99.2 F (37.3 C) (11/27 0417) Pulse Rate:  [61-99] 67 (11/27 0417) Resp:  [18] 18 (11/27 0417) BP: (140-180)/(69-87) 180/87 mmHg (11/27 0417) SpO2:  [97 %-100 %] 97 % (11/27 0417) Weight:  [330 lb 4 oz (149.8 kg)] 330 lb 4 oz (149.8 kg) (11/27 0417) Last BM Date: 09/01/13  Intake/Output from previous day: 11/26 0701 - 11/27 0700 In: 3029.7 [P.O.:150; I.V.:958.7; IV Piggyback:40; [id number] Out: [id number] [Urine:1600; Drains:1000]  Exam: Abd - morbidly obese; open central abdominal wound with dressing in place, on suction with succus in cannister Ext - no significant edema Neuro - grossly intact, no focal deficits  Lab Results:   Recent Labs  09/01/13 0515 09/02/13 0445  WBC 8.4 7.5  HGB 11.4* 11.3*  HCT 33.9* 33.6*  PLT 269 272     Recent Labs  09/01/13 0515 09/02/13 0445  NA 139 135  K 3.6 3.9  CL 101 99  CO2 29 28  GLUCOSE 56* 101*  BUN 32* 25*  CREATININE 1.35 1.18  CALCIUM 8.8 9.1    Studies/Results: No results found.  Assessment / Plan: 1.  Status post drainage of abscess and excision of Gor-Tex patch from ventral hernia; entero-atmospheric fistulae  NPO, TNA  Wound care  Sandostatin  Earnstine Regal, MD, [facility] Office: [phone]  09/02/2013

## 2013-09-02 NOTE — Progress Notes (Signed)
PARENTERAL NUTRITION CONSULT NOTE - FOLLOW UP  Pharmacy Consult for TNA Indication: Enterocutaneous fistula  No Known Allergies  Patient Measurements: Height: 6' (182.9 cm) Weight: 330 lb 4 oz (149.8 kg) IBW/kg (Calculated) : 77.6 Adjusted Body Weight: 102.6kg  Vital Signs: Temp: 99.2 F (37.3 C) (11/27 0417) Temp src: Axillary (11/27 0417) BP: 180/87 mmHg (11/27 0417) Pulse Rate: 67 (11/27 0417) Intake/Output from previous day: 11/26 0701 - 11/27 0700 In: 3029.7 [P.O.:150; I.V.:958.7; IV Piggyback:40; [id number] Out: 2600 [Urine:1600; Drains:1000]  Labs:  Recent Labs  08/31/13 0650 09/01/13 0515 09/02/13 0445  WBC 7.8 8.4 7.5  HGB 11.4* 11.4* 11.3*  HCT 33.3* 33.9* 33.6*  PLT 237 269 272    Recent Labs  08/31/13 0650 09/01/13 0515 09/02/13 0445  NA 134* 139 135  K 3.6 3.6 3.9  CL 97 101 99  CO2 30 29 28   GLUCOSE 107* 56* 101*  BUN 40* 32* 25*  CREATININE 1.66* 1.35 1.18  CALCIUM 8.7 8.8 9.1  MG  --   --  1.9  PHOS 4.0  --  3.2  PROT  --   --  7.3  ALBUMIN  --   --  2.1*  AST  --   --  21  ALT  --   --  12  ALKPHOS  --   --  48  BILITOT  --   --  0.8   Estimated Creatinine Clearance: 100.3 ml/min (by C-G formula based on Cr of 1.18).    Recent Labs  09/01/13 2003 09/02/13 0048 09/02/13 0415  GLUCAP 159* 158* 109*    Medications:  Infusions:  . sodium chloride 20 mL/hr at 09/01/13 0837  . [name] KitchenTPN (CLINIMIX-E) Adult 125 mL/hr at 09/01/13 2300   And  . fat emulsion 250 mL (09/01/13 2300)    Insulin Requirements in the past 24 hours:   Lantus 65units sq qHS   8 units SSI required  - CBG q4h with resistant scale coverage  50 units regular insulin added to TNA   Nutritional Goals:   Per RD 11/13:  2400-2700 kcal/day, 160-180 grams/day protein, 3.3-3.5 L/day fluid  Clinimix E5/15 at goal rate 125 ml/hr (3L/day) with 20% lipids at 10 ml/hr to provide 150g protein and 2675 kcal daily.  Due to how much fluid can be placed in TNA bag, Max  volume is 3L.   Current Nutrition:   NPO  Clinimix 5/15 at 125 ml/hr and 20% lipids at 66ml/hr   IVF: NS at [place]: 62 yo male with large abdominal abscess with infected old Gore Tex mesh s/p I&D abscess, removal of mesh, debridement of abdominal wall and peritoneum. 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 TNA started 11/12 at 10pm.   Glucose: Hx DM on insulin PTA, CBG improved with addition of insulin to TNA 11/16 and increased Lantus doses.     Electrolytes: WNL  SCr: 1.18 (improved), on lasix 40mg  IV daily on KCl 60meq daily, net I/O (24h) = +199ml with increased output from abd drain  LFTs: wnl, albumin only 2.1  TGs: wnl, 124 (11/13), 89 (11/17), 99 (11/24)  Prealbumin: low, 10.3 (11/13), 11.1 (11/17), 11.2(11/24)  Monitor fluid status very closely since TNA at high goal rate (TNA + lipids + IVF = 155 ml/hr).  I/Os documented, but unsure of accuracy of documentation.  Octreotide started 11/17 to control fistula output. Lasix decreased from 80mg  BID to 40mg  BID on 11/24 due to rising SCr (watch K+ since remains on same KCl supplement  with dec loop dose).   TPN Access: Triple lumen PICC placed 11/12 TPN day#: 16  Plan:   Continue Clinimix E 5/15 at goal rate of 16ml/hr + lipids 20% 51ml/hr  Continue insulin 50 units per 3L bag (16.7 units/L)  Continue Lantus 65 units qhs  KCl 20mEq PO daily per MD  TNA to contain standard multivitamins and trace elements.  Continue NS at [place] on Mondays & Thursdays  F/u clinical progress  Johny Drilling, PharmD, BCPS Clinical Pharmacist Pager: [phone] Pharmacy: [phone] 09/02/2013 7:36 AM

## 2013-09-03 LAB — GLUCOSE, CAPILLARY
Glucose-Capillary: 144 mg/dL — ABNORMAL HIGH (ref 70–99)
Glucose-Capillary: 97 mg/dL (ref 70–99)
Glucose-Capillary: 100 mg/dL — ABNORMAL HIGH (ref 70–99)
Glucose-Capillary: 136 mg/dL — ABNORMAL HIGH (ref 70–99)
Glucose-Capillary: 125 mg/dL — ABNORMAL HIGH (ref 70–99)

## 2013-09-03 LAB — CBC
MCHC: 33.9 g/dL (ref 30.0–36.0)
MCV: 94 fL (ref 78.0–100.0)
Platelets: 270 10*3/uL (ref 150–400)
RDW: 13.8 % (ref 11.5–15.5)
WBC: 8.1 10*3/uL (ref 4.0–10.5)

## 2013-09-03 MED ORDER — TRACE MINERALS CR-CU-F-FE-I-MN-MO-SE-ZN IV SOLN
INTRAVENOUS | Status: AC
  Administered 2013-09-03: 18:00:00 via INTRAVENOUS
  Filled 2013-09-03: qty 3000

## 2013-09-03 MED ORDER — LORAZEPAM 2 MG/ML IJ SOLN
1.0000 mg | Freq: Two times a day (BID) | INTRAMUSCULAR | Status: DC | PRN
  Administered 2013-09-03 (×2): 1 mg via INTRAVENOUS
  Filled 2013-09-03 (×3): qty 1

## 2013-09-03 MED ORDER — FAT EMULSION 20 % IV EMUL
250.0000 mL | INTRAVENOUS | Status: AC
  Administered 2013-09-03: 250 mL via INTRAVENOUS
  Filled 2013-09-03: qty 250

## 2013-09-03 NOTE — Code Documentation (Signed)
WOC wound follow up Wound type: open abdominal wound with EC fistula Measurement: 24cm x 8cm x 1.5 cm with undermining from 4 o'clock to 8 o'clock that is from 8cm to 5 cm in length.   Wound bed: fibrin and fascia, with some necrosis noted at the most proximal point of the wound today and along the medial wound edges. Wound is getting dry at the most proximal point and this may be why the tissue is more necrotic.  Drainage (amount, consistency, odor) EC fistula contents in the wound bed and pooling in the undermined sections.  Green-brown, thin.   Periwound: intact, less erythema of the lateral pannus Dressing procedure/placement/frequency: the current Eakin pouch has been in place since Monday, since we are heading into the weekend I opted to change this pouch today.  I replaced the pouch and the red robinson catheter. Old pouch and Mepitel removed and discarded.  Placed catheter in the lateral side of the pouch today and coiled along the distal rim of the wound to try to lessen the pooling of drainage.  1pc of Mepitel placed over the distal wound bed.  Ordered 3 Eakin pouches for room (has Mepitel, extra catheter, and pink tape in the room).    Pt slept through the entire pouch change.  LWS adjusted around 118mmHG stopcock turned to on.  Pt is knowledgeable of how to turn stopcock to off and disconnect to ambulate or get out of bed.   [facility] team will follow along for assistance with fistula management Josely Moffat [place] [phone]

## 2013-09-03 NOTE — Progress Notes (Signed)
OT Cancellation Note  Patient Details Name: Kurt Cross MRN: [id number] DOB: 1951/09/29   Cancelled Treatment:    Reason Eval/Treat Not Completed: Other (comment) Per nursing had an ativan and pt reports feeling sleepy and requesting to rest. Will try back later time.  Jules Schick [id number] 09/03/2013, 11:05 AM

## 2013-09-03 NOTE — Progress Notes (Signed)
PARENTERAL NUTRITION CONSULT NOTE - FOLLOW UP  Pharmacy Consult for TNA Indication: Enterocutaneous fistula  No Known Allergies  Patient Measurements: Height: 6' (182.9 cm) Weight: 350 lb 1.5 oz (158.8 kg) IBW/kg (Calculated) : 77.6 Adjusted Body Weight: 102.6kg  Vital Signs: Temp: 98.7 F (37.1 C) (11/28 0419) Temp src: Oral (11/28 0419) BP: 157/78 mmHg (11/28 0419) Pulse Rate: 66 (11/28 0419) Intake/Output from previous day: 11/27 0701 - 11/28 0700 In: 3887 [P.O.:260; I.V.:468; TPN:3159] Out: 2400 [Urine:2250; Drains:150]  Labs:  Recent Labs  09/01/13 0515 09/02/13 0445 09/03/13 0600  WBC 8.4 7.5 8.1  HGB 11.4* 11.3* 11.6*  HCT 33.9* 33.6* 34.2*  PLT 269 272 270    Recent Labs  09/01/13 0515 09/02/13 0445  NA 139 135  K 3.6 3.9  CL 101 99  CO2 29 28  GLUCOSE 56* 101*  BUN 32* 25*  CREATININE 1.35 1.18  CALCIUM 8.8 9.1  MG  --  1.9  PHOS  --  3.2  PROT  --  7.3  ALBUMIN  --  2.1*  AST  --  21  ALT  --  12  ALKPHOS  --  48  BILITOT  --  0.8   Estimated Creatinine Clearance: 103.7 ml/min (by C-G formula based on Cr of 1.18).    Recent Labs  09/02/13 1637 09/03/13 0014 09/03/13 0411  GLUCAP 145* 123* 97    Medications:  Infusions:  . sodium chloride 20 mL/hr at 09/02/13 2300  . [name] KitchenTPN (CLINIMIX-E) Adult 125 mL/hr at 09/02/13 1748   And  . fat emulsion 250 mL (09/02/13 1748)    Insulin Requirements in the past 24 hours:   Lantus 65units sq qHS   15 units SSI required  - CBG q4h with resistant scale coverage  50 units regular insulin added to TNA   Nutritional Goals:   Per RD 11/13:  2400-2700 kcal/day, 160-180 grams/day protein, 3.3-3.5 L/day fluid  Clinimix E5/15 at goal rate 125 ml/hr (3L/day) with 20% lipids at 10 ml/hr to provide 150g protein and 2675 kcal daily.  Due to how much fluid can be placed in TNA bag, Max volume is 3L.   Current Nutrition:   NPO  Clinimix 5/15 at 125 ml/hr and 20% lipids at 55ml/hr   IVF: NS at  [place]: 62 yo male with large abdominal abscess with infected old Gore Tex mesh s/p I&D abscess, removal of mesh, debridement of abdominal wall and peritoneum. 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 TNA started 11/12 at 10pm.   Glucose: Hx DM on insulin PTA, CBG improved with addition of insulin to TNA 11/16 and increased Lantus doses.     Electrolytes: No chemistry labs this am, previous - WNL  SCr: 1.18 (improved), on lasix 40mg  IV daily on KCl 52meq daily, net I/O (24h) = +1549ml with decreased output from abd drain (? Drainage accurate went from 1L to 162ml out last 2 days)  LFTs: wnl, albumin only 2.1  TGs: wnl, 124 (11/13), 89 (11/17), 99 (11/24)  Prealbumin: low, 10.3 (11/13), 11.1 (11/17), 11.2(11/24)  Monitor fluid status very closely since TNA at high goal rate (TNA + lipids + IVF = 155 ml/hr).  I/Os documented, but unsure of accuracy of documentation.  Octreotide started 11/17 to control fistula output. Lasix decreased from 80mg  BID to 40mg  BID on 11/24 due to rising SCr (watch K+ since remains on same KCl supplement with dec loop dose).   TPN Access: Triple lumen PICC placed 11/12 TPN day#:  17  Plan:   Continue Clinimix E 5/15 at goal rate of 160ml/hr + lipids 20% 21ml/hr  Continue insulin 50 units per 3L bag (16.7 units/L)  Continue Lantus 65 units qhs  KCl 49mEq PO daily per MD  TNA to contain standard multivitamins and trace elements.  Continue NS at [place] on Mondays & Thursdays  F/u clinical progress  Doreene Eland, PharmD, BCPS.   Pager: [phone] 09/03/2013 7:07 AM

## 2013-09-03 NOTE — Progress Notes (Signed)
Patient ID: Kurt Cross, male   DOB: June 29, 1951, 62 y.o.   MRN: [id number] [facility] PROGRESS NOTE  Kurt Cross [id number] DOB: [birth year] DOA: 08/11/2013 PCP: [name], MD  Brief narrative:  62 y.o. male with a remote history of bowel perforation requiring exploratory laparotomy and temporary colostomy. He then developed a ventral hernia and underwent mesh repair decades ago. He was recently hospitalized and treated for possible pneumonia. He had a fall at home recently and injured his right lower leg leading to readmission. He was noted to be febrile with some abdominal wall swelling. CT scan revealed a large abdominal wall abscess. 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 He was placed on bowel rest and started on TPN. Early in the admission, the patient was fluid overloaded and received intravenous furosemide. He has been since transitioned back to his home dose of oral furosemide and remains clinically stable from CHF standpoint.   Assessment/Plan:  #1 infected prosthetic Gore-Tex mesh with a giant abdominal abscess status post IandD and Gore-Tex mesh removal 08/14/2013 now with enterocut fistula  - Patient is status post incision and drainage of abscess, Gore-Tex mesh removal, debridement of abdominal wall and peritoneum 08/14/2013.  - Blood cultures with no growth to date  - now with EC fistula-->started on sandostatin by surgery 08/23/13 and dose increased.  - Continue sandostatin, TNA, wound care, supportiver care. #2 sepsis  - secondary to problem #1.  - Wound cultures prelim with GPC in pairs and clusters-->Coag Neg Staph  #3 HTN  - improving, Hydralazine added 11/27 - will continue Coreg, Hydralazine, Lasix   - if SBP remains persistently elevated above 150 will increase the dose of Hydralazine  #4 atrial fibrillation/atrial flutter  - secondary to problem #1. CE x 3 neg, 2  D ECHO EF 55% - currently rate controlled, continue Coreg  - CHADSVASc score is 4 (not ASVD plaque on abd aorta), resumed eliquis  #5 chronic kidney disease stage III  - Creatinine is trending down and is within normal limits this AM #6 leukocytosis/fever  - secondary to problem #1  - pt remains afebrile over 48 hours and WBC is within normal limits  #7 chronic combined systolic and diastolic CHF  - Initial CXR with volume overload. given 4 doses of lasix 40mg  IV  -08/12/2013 ECHO EF 50-55% (EF improved compared to one year ago (35-40%)) - weight trending down 356 lbs --> 248 --> 333 --> 330 but up from yesterday and 350 lbs this AM - will repeat weight check to make sure the reading is correct #8 Uncontrolled Pain  - continue analgesia as needed #9 hyperlipidemia  - Continue statin.  #10 4.5 cm ascending aortic aneurysm per CT scan of 08/01/2013  - Stable. Asymptomatic. Continue risk factor modification with good blood pressure control.  #11 type 2 diabetes  - Hemoglobin A1c was 9.5 on 08/02/2013.Continue Lantus 65 units daily.  - Sliding scale insulin.  #12. Hypomagnesemia  - Repleted  #13 ?? Pauses  - Patient asymptomatic. If HR decreases will decrease coreg. Cardiology notified and ICD has been interrogated.   Family Communication: Updated patient, no family at beside  Disposition Plan: Home when cleared by surgery   Antibiotics  Vancomycin 08/15/13>>> 08/31/13  Zosyn 08/14/13>>>08/31/13  Procedures/Studies:  Ct Abdomen Pelvis Wo Contrast 08/14/2013 14.5 x 18.1 x 26.1 cm fluid collection/abscess within the anterior abdominal wall,  as described above, incompletely visualized. The collection appears just anterior to prior ventral hernia mesh. Given the size and proximity to the skin surface, incision and drainage is suggested.  Dg Chest 2 View 08/01/2013 No active cardiopulmonary disease.  Dg Hip Complete Right 08/11/2013 No acute osseous injury of the right hip.  Ct Angio Chest Pe  W/cm &/or Wo Cm 08/01/2013 4.5 cm AAA without complicating features. No evidence of acute pulmonary embolism or aortic dissection.  Dg Chest Port 1 View 08/18/2013 Right upper extremity PICC terminates in the mid superior vena cava. Stable congestive heart failure pattern.  Dg Chest Port 1 View 08/17/2013 Stable chest with changes of congestive heart failure with pulmonary interstitial edema. Cardiac pacer in stable position.  Dg Chest Portable 1 View 08/15/2013 Stable cardiomegaly with interval worsening of pulmonary vascular congestion/ edema relative to 08/13/2013.  Dg Chest Port 1 View 08/13/2013 No acute findings. Stable cardiomegaly and pulmonary venous prominence without overt edema.  2-D echo 08/12/2013  Incision and drainage of abscess, Gore-Tex mesh removal, debridement of abdominal wall and peritoneum Consultants:  Cardiology: Dr. Terrence Dupont 08/13/2013  General surgery: Dr. Johney Maine 08/14/2013  ID: Dr Megan Salon 08/15/13  HPI/Subjective: No events overnight.   Objective: Filed Vitals:   09/02/13 1430 09/02/13 2017 09/03/13 0015 09/03/13 0419  BP: 164/67 153/79  157/78  Pulse: 64 60 61 66  Temp: 99 F (37.2 C) 98.7 F (37.1 C)  98.7 F (37.1 C)  TempSrc: Oral Oral  Oral  Resp: 18 18 16 19   Height:      Weight:    158.8 kg (350 lb 1.5 oz)  SpO2: 100% 100% 100% 100%    Intake/Output Summary (Last 24 hours) at 09/03/13 0650 Last data filed at 09/03/13 0620  Gross per 24 hour  Intake 5116.67 ml  Output   2700 ml  Net 2416.67 ml    Exam:   General:  Pt is alert, follows commands appropriately, not in acute distress  Cardiovascular: Regular rate and rhythm, S1/S2, no murmurs, no rubs, no gallops  Respiratory: Clear to auscultation bilaterally, no wheezing, no crackles, no rhonchi  Abdomen: Soft, open abdominal wound with suction in place   Neuro: Grossly nonfocal  Data Reviewed: Basic Metabolic Panel:  Recent Labs Lab 08/28/13 0420 08/29/13 0500 08/30/13 0500  08/31/13 0650 09/01/13 0515 09/02/13 0445  NA 134* 135 137 134* 139 135  K 3.2* 3.4* 3.4* 3.6 3.6 3.9  CL 94* 95* 96 97 101 99  CO2 31 33* 32 30 29 28   GLUCOSE 96 86 94 107* 56* 101*  BUN 31* 30* 37* 40* 32* 25*  CREATININE 1.34 1.35 1.63* 1.66* 1.35 1.18  CALCIUM 8.9 8.9 8.8 8.7 8.8 9.1  MG  --   --  1.9  --   --  1.9  PHOS 3.6  --  5.4* 4.0  --  3.2   Liver Function Tests:  Recent Labs Lab 08/30/13 0500 09/02/13 0445  AST 17 21  ALT 9 12  ALKPHOS 40 48  BILITOT 0.6 0.8  PROT 7.1 7.3  ALBUMIN 2.0* 2.1*   CBC:  Recent Labs Lab 08/30/13 0500 08/31/13 0650 09/01/13 0515 09/02/13 0445 09/03/13 0600  WBC 7.6 7.8 8.4 7.5 8.1  NEUTROABS 4.1  --   --   --   --   HGB 11.4* 11.4* 11.4* 11.3* 11.6*  HCT 33.8* 33.3* 33.9* 33.6* 34.2*  MCV 94.7 94.3 93.6 94.1 94.0  PLT 264 237 269 272 270   CBG:  Recent Labs Lab 09/02/13 0746 09/02/13 1223 09/02/13 1637 09/03/13 0014 09/03/13 0411  GLUCAP 127* 139* 145* 123* 97   Scheduled Meds: . acetaminophen  1,000 mg Oral TID  . apixaban  5 mg Oral BID  . atorvastatin  40 mg Oral QODAY  . carvedilol  3.125 mg Oral BID WC  . furosemide  40 mg Oral Daily  . hydrALAZINE  5 mg Intravenous Q8H  . insulin aspart  0-20 Units Subcutaneous Q4H  . insulin glargine  65 Units Subcutaneous QHS  . loperamide  2 mg Oral QHS  . octreotide  150 mcg Subcutaneous TID  . OxyCODONE  15 mg Oral Q12H  . potassium chloride  40 mEq Oral Daily  . saccharomyces boulardii  250 mg Oral BID   Continuous Infusions: . sodium chloride 20 mL/hr at 09/02/13 2300  . [name] KitchenTPN (CLINIMIX-E) Adult 125 mL/hr at 09/02/13 1748   And  . fat emulsion 250 mL (09/02/13 1748)   Faye Ramsay, MD  [facility] Pager [phone]  If 7PM-7AM, please contact night-coverage [email or web address] Password [email or web address] 09/03/2013, 6:50 AM   LOS: 23 days

## 2013-09-03 NOTE — Progress Notes (Signed)
Physical Therapy Treatment Patient Details Name: Kurt Cross MRN: [id number] DOB: [birth year] Today's Date: 09/03/2013 Time: [id number] PT Time Calculation (min): 12 min  PT Assessment / Plan / Recommendation  History of Present Illness Kurt Cross is a 62 y.o. male  with known history of chronic systolic heart failure status post defibrillator placement last EF measured was in 2013 was 35-40%, on home oxygen, OSA noncompliant with CPAP, diabetes mellitus, hypertension and hyperlipidemia presented to the ER because right leg pain after sustaining a fall the night prior to admission.  08/14/13: s/p abdominal abscess removal   PT Comments   Progressing; willing to work with PT with wife's encouragement  Follow Up Recommendations  SNF     Does the patient have the potential to tolerate intense rehabilitation     Barriers to Discharge        Equipment Recommendations  None recommended by PT    Recommendations for Other Services    Frequency Min 3X/week   Progress towards PT Goals Progress towards PT goals: Progressing toward goals  Plan Current plan remains appropriate    Precautions / Restrictions Precautions Precautions: Fall Precaution Comments: ostomy bag on R side and wall suction to ABD   Pertinent Vitals/Pain No c/o pain    Mobility  Bed Mobility Bed Mobility: Not assessed Transfers Transfers: Sit to Stand;Stand to Sit Sit to Stand: 5: Supervision;4: Min guard;From chair/3-in-1 Stand to Sit: 5: Supervision;4: Min guard;To chair/3-in-1 Details for Transfer Assistance: min guard for safety. pt rocks and lurches to stand and has decreased control of descent, does not reach back for chair as instructed Ambulation/Gait Ambulation/Gait Assistance: 4: Min guard;5: Supervision Ambulation Distance (Feet): 120 Feet Assistive device: Rolling walker Ambulation/Gait Assistance Details: cues for safety Gait Pattern: Step-through pattern;Wide base of support;Decreased stride  length;Ataxic General Gait Details: sats 96% after amb on RA; HR 92    Exercises     PT Diagnosis:    PT Problem List:   PT Treatment Interventions:     PT Goals (current goals can now be found in the care plan section) Acute Rehab PT Goals Patient Stated Goal: none specified Time For Goal Achievement: 09/08/13 Potential to Achieve Goals: Good  Visit Information  Last PT Received On: 09/03/13 Assistance Needed: +1 History of Present Illness: Kurt Cross is a 62 y.o. male  with known history of chronic systolic heart failure status post defibrillator placement last EF measured was in 2013 was 35-40%, on home oxygen, OSA noncompliant with CPAP, diabetes mellitus, hypertension and hyperlipidemia presented to the ER because right leg pain after sustaining a fall the night prior to admission.  08/14/13: s/p abdominal abscess removal    Subjective Data  Subjective: no I don't want to walk, wife said he had to Patient Stated Goal: none specified   Cognition  Cognition Arousal/Alertness: Awake/alert Behavior During Therapy: WFL for tasks assessed/performed Overall Cognitive Status: Within Functional Limits for tasks assessed    Balance     End of Session PT - End of Session Activity Tolerance: Patient tolerated treatment well Patient left: in chair;with call bell/phone within reach;with family/visitor present Nurse Communication: Mobility status;Other (comment)   GP     [facility] 09/03/2013, 3:35 PM

## 2013-09-04 LAB — GLUCOSE, CAPILLARY
Glucose-Capillary: 127 mg/dL — ABNORMAL HIGH (ref 70–99)
Glucose-Capillary: 144 mg/dL — ABNORMAL HIGH (ref 70–99)
Glucose-Capillary: 121 mg/dL — ABNORMAL HIGH (ref 70–99)
Glucose-Capillary: 119 mg/dL — ABNORMAL HIGH (ref 70–99)

## 2013-09-04 LAB — BASIC METABOLIC PANEL
BUN: 23 mg/dL (ref 6–23)
Chloride: 100 mEq/L (ref 96–112)
GFR calc Af Amer: 84 mL/min — ABNORMAL LOW (ref >90)
GFR calc non Af Amer: 73 mL/min — ABNORMAL LOW (ref >90)
Potassium: 3.8 mEq/L (ref 3.5–5.1)
Sodium: 137 mEq/L (ref 135–145)

## 2013-09-04 LAB — CBC
MCHC: 34.2 g/dL (ref 30.0–36.0)
Platelets: 279 10*3/uL (ref 150–400)
RBC: 3.63 MIL/uL — ABNORMAL LOW (ref 4.22–5.81)
RDW: 13.9 % (ref 11.5–15.5)
WBC: 8.1 10*3/uL (ref 4.0–10.5)

## 2013-09-04 MED ORDER — ZOLPIDEM TARTRATE 5 MG PO TABS
5.0000 mg | ORAL_TABLET | Freq: Every evening | ORAL | Status: DC | PRN
  Administered 2013-09-04: 01:00:00 5 mg via ORAL
  Filled 2013-09-04: qty 1

## 2013-09-04 MED ORDER — DEXTROSE 10 % IV SOLN
INTRAVENOUS | Status: AC
  Administered 2013-09-04: 16:00:00 via INTRAVENOUS
  Filled 2013-09-04: qty 1000

## 2013-09-04 MED ORDER — FAT EMULSION 20 % IV EMUL
250.0000 mL | INTRAVENOUS | Status: AC
  Administered 2013-09-04: 250 mL via INTRAVENOUS
  Filled 2013-09-04: qty 250

## 2013-09-04 MED ORDER — TRACE MINERALS CR-CU-F-FE-I-MN-MO-SE-ZN IV SOLN
INTRAVENOUS | Status: AC
  Administered 2013-09-04: 18:00:00 via INTRAVENOUS
  Filled 2013-09-04: qty 3000

## 2013-09-04 MED ORDER — LORAZEPAM 2 MG/ML IJ SOLN
2.0000 mg | Freq: Two times a day (BID) | INTRAMUSCULAR | Status: DC | PRN
  Administered 2013-09-04 – 2013-10-12 (×5): 2 mg via INTRAVENOUS
  Filled 2013-09-04 (×5): qty 1

## 2013-09-04 NOTE — Progress Notes (Signed)
21 Days Post-Op  Subjective: No new issues  Objective: Vital signs in last 24 hours: Temp:  [98.2 F (36.8 C)-98.7 F (37.1 C)] 98.2 F (36.8 C) (11/29 0413) Pulse Rate:  [61-86] 86 (11/29 0413) Resp:  [18-19] 19 (11/29 0413) BP: (128-165)/(65-84) 165/65 mmHg (11/29 0413) SpO2:  [95 %-99 %] 98 % (11/29 0413) Weight:  [342 lb 9.5 oz (155.4 kg)] 342 lb 9.5 oz (155.4 kg) (11/29 0413) Last BM Date: 09/01/13  Intake/Output from previous day: 11/28 0701 - 11/29 0700 In: 3556.1 [I.V.:474.3; TPN:3081.8] Out: 2700 [Urine:2250; Drains:450] Intake/Output this shift: Total I/O In: 0  Out: 825 [Urine:825]  General appearance: cooperative and no distress GI: soft, NT, ND, about 470ml of bilious output from fistula  Lab Results:   Recent Labs  09/03/13 0600 09/04/13 0502  WBC 8.1 8.1  HGB 11.6* 11.6*  HCT 34.2* 33.9*  PLT 270 279   BMET  Recent Labs  09/02/13 0445 09/04/13 0502  NA 135 137  K 3.9 3.8  CL 99 100  CO2 28 27  GLUCOSE 101* 127*  BUN 25* 23  CREATININE 1.18 1.08  CALCIUM 9.1 9.2   PT/INR No results found for this basename: LABPROT, INR,  in the last 72 hours ABG No results found for this basename: PHART, PCO2, PO2, HCO3,  in the last 72 hours  Studies/Results: No results found.  Anti-infectives: Anti-infectives   Start     Dose/Rate Route Frequency Ordered Stop   08/31/13 0800  vancomycin (VANCOCIN) 1,750 mg in sodium chloride 0.9 % 500 mL IVPB  Status:  Discontinued     1,750 mg 250 mL/hr over 120 Minutes Intravenous Every 24 hours 08/31/13 0753 08/31/13 1417   08/16/13 0800  vancomycin (VANCOCIN) 2,000 mg in sodium chloride 0.9 % 500 mL IVPB  Status:  Discontinued     2,000 mg 250 mL/hr over 120 Minutes Intravenous Every 24 hours 08/15/13 1411 08/31/13 0753   08/15/13 1500  vancomycin (VANCOCIN) 2,000 mg in sodium chloride 0.9 % 500 mL IVPB     2,000 mg 250 mL/hr over 120 Minutes Intravenous  Once 08/15/13 1411 08/15/13 1713   08/14/13 1600   piperacillin-tazobactam (ZOSYN) IVPB 3.375 g  Status:  Discontinued     3.375 g 12.5 mL/hr over 240 Minutes Intravenous Every 8 hours 08/14/13 1543 08/31/13 1417      Assessment/Plan: s/p Procedure(s): INCISION AND DRAINAGE ABSCESS, mesh  (N/A) He is stable.   Not much change otherwise for now. Effluent is thin bilious and would probably be a candidate for wound vac to control output.  I would consider placing wound vac to encourage granulation tissue around the fistula and he would eventually be candidate for possible skin graft and pouch.  Monitor lytes  LOS: 24 days    Madilyn Hook DAVID 09/04/2013

## 2013-09-04 NOTE — Progress Notes (Signed)
PARENTERAL NUTRITION CONSULT NOTE - FOLLOW UP  Pharmacy Consult for TNA Indication: Enterocutaneous fistula  No Known Allergies  Patient Measurements: Height: 6' (182.9 cm) Weight: 342 lb 9.5 oz (155.4 kg) IBW/kg (Calculated) : 77.6 Adjusted Body Weight: 102.6kg  Vital Signs: Temp: 98.2 F (36.8 C) (11/29 0413) Temp src: Oral (11/29 0413) BP: 165/65 mmHg (11/29 0413) Pulse Rate: 86 (11/29 0413) Intake/Output from previous day: 11/28 0701 - 11/29 0700 In: 3556.1 [I.V.:474.3; TPN:3081.8] Out: 2700 [Urine:2250; Drains:450]  Labs:  Recent Labs  09/02/13 0445 09/03/13 0600 09/04/13 0502  WBC 7.5 8.1 8.1  HGB 11.3* 11.6* 11.6*  HCT 33.6* 34.2* 33.9*  PLT 272 270 279    Recent Labs  09/02/13 0445 09/04/13 0502  NA 135 137  K 3.9 3.8  CL 99 100  CO2 28 27  GLUCOSE 101* 127*  BUN 25* 23  CREATININE 1.18 1.08  CALCIUM 9.1 9.2  MG 1.9  --   PHOS 3.2  --   PROT 7.3  --   ALBUMIN 2.1*  --   AST 21  --   ALT 12  --   ALKPHOS 48  --   BILITOT 0.8  --    Estimated Creatinine Clearance: 111.8 ml/min (by C-G formula based on Cr of 1.08).    Recent Labs  09/03/13 2031 09/03/13 2325 09/04/13 0412  GLUCAP 112* 108* 127*    Medications:  Infusions:  . sodium chloride 20 mL/hr at 09/04/13 0500  . [name] KitchenTPN (CLINIMIX-E) Adult 125 mL/hr at 09/04/13 0600   And  . fat emulsion 250 mL (09/03/13 1802)    Insulin Requirements in the past 24 hours:   Lantus 65units sq qHS   8 units SSI required  - CBG q4h with resistant scale coverage  50 units regular insulin added to TNA   Nutritional Goals:   Per RD 11/13:  2400-2700 kcal/day, 160-180 grams/day protein, 3.3-3.5 L/day fluid  Clinimix E5/15 at goal rate 125 ml/hr (3L/day) with 20% lipids at 10 ml/hr to provide 150g protein and 2675 kcal daily.  Due to how much fluid can be placed in TNA bag, Max volume is 3L.   Current Nutrition:   NPO  Clinimix 5/15 at 125 ml/hr and 20% lipids at 38ml/hr   IVF: NS at  [place]: 62 yo male with large abdominal abscess with infected old Gore Tex mesh s/p I&D abscess, removal of mesh, debridement of abdominal wall and peritoneum. 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 TNA started 11/12 at 10pm.   Glucose: Hx DM on insulin PTA, CBG improved with addition of insulin to TNA 11/16 and increased Lantus doses.     Electrolytes:  WNL  SCr: 1.08 (improved), on lasix 40mg  IV daily on KCl 8meq daily  LFTs: wnl 11/27, albumin only 2.1  TGs: wnl, 124 (11/13), 89 (11/17), 99 (11/24)  Prealbumin: low, 10.3 (11/13), 11.1 (11/17), 11.2(11/24)  Monitor fluid status very closely since TNA at high goal rate (TNA + lipids + IVF = 155 ml/hr).  I/Os documented, but unsure of accuracy of documentation.  Octreotide started 11/17 to control fistula output. Lasix decreased from 80mg  BID to 40mg  BID on 11/24 due to rising SCr (watch K+ since remains on same KCl supplement with dec loop dose).   TPN Access: Triple lumen PICC placed 11/12 TPN day#: 18  Plan:   Continue Clinimix E 5/15 at goal rate of 134ml/hr + lipids 20% 51ml/hr  Continue insulin 50 units per 3L bag (16.7 units/L)  Continue Lantus 65 units qhs  KCl 36mEq PO daily per MD  TNA to contain standard multivitamins and trace elements.  Continue NS at [place] lab panels on Mondays & Thursdays  Jolly, Gaye Alken PharmD Pager #: [phone] 7:26 AM 09/04/2013

## 2013-09-04 NOTE — Progress Notes (Signed)
Patient ID: Kurt Cross, male   DOB: 02/23/1951, 62 y.o.   MRN: [id number]  [facility] PROGRESS NOTE  Kurt Cross [id number] DOB: Oct 13, 1950 DOA: 08/11/2013 PCP: [name], MD  Brief narrative:  62 y.o. male with a remote history of bowel perforation requiring exploratory laparotomy and temporary colostomy. He then developed a ventral hernia and underwent mesh repair decades ago. He was recently hospitalized and treated for possible pneumonia. He had a fall at home recently and injured his right lower leg leading to readmission. He was noted to be febrile with some abdominal wall swelling. CT scan revealed a large abdominal wall abscess. 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 He was placed on bowel rest and started on TPN. Early in the admission, the patient was fluid overloaded and received intravenous furosemide. He has been since transitioned back to his home dose of oral furosemide and remains clinically stable from CHF standpoint.   Assessment/Plan:  #1 infected prosthetic Gore-Tex mesh with a giant abdominal abscess status post IandD and Gore-Tex mesh removal 08/14/2013 now with enterocut fistula  - Patient is status post incision and drainage of abscess, Gore-Tex mesh removal, debridement of abdominal wall and peritoneum 08/14/2013.  - Blood cultures with no growth to date  - now with EC fistula-->started on sandostatin by surgery 08/23/13 and dose increased.  - Continue sandostatin, TNA, wound care, supportiver care.  #2 sepsis  - secondary to problem #1.  - Wound cultures prelim with GPC in pairs and clusters-->Coag Neg Staph  #3 HTN  - improving, Hydralazine added 11/27  - will continue Coreg, Hydralazine, Lasix  - if SBP remains persistently elevated above 150 will increase the dose of Hydralazine  #4 atrial fibrillation/atrial flutter  - secondary to problem #1. CE x 3  neg, 2 D ECHO EF 55%  - currently rate controlled, continue Coreg  - CHADSVASc score is 4 (not ASVD plaque on abd aorta), resumed eliquis  #5 chronic kidney disease stage III  - Creatinine is within normal limits this AM  #6 leukocytosis/fever  - secondary to problem #1  - pt remains afebrile over 72 hours and WBC is within normal limits  #7 chronic combined systolic and diastolic CHF  - Initial CXR with volume overload. given 4 doses of lasix 40mg  IV  -08/12/2013 ECHO EF 50-55% (EF improved compared to one year ago (35-40%))  - weight trending down 356 lbs --> 248 --> 333 --> 330 --> 250 --> 342 this AM #8 Uncontrolled Pain  - continue analgesia as needed  #9 hyperlipidemia  - Continue statin.  #10 4.5 cm ascending aortic aneurysm per CT scan of 08/01/2013  - Stable. Asymptomatic. Continue risk factor modification with good blood pressure control.  #11 type 2 diabetes  - Hemoglobin A1c 9.5 08/02/2013.Continue Lantus 65 units daily.  - Sliding scale insulin.  #12. Hypomagnesemia  - Repleted  #13 ?? Pauses  - Patient asymptomatic. If HR decreases will decrease coreg. Cardiology notified and ICD has been interrogated.   Family Communication: Updated patient, no family at beside  Disposition Plan: To be determined   Antibiotics  Vancomycin 08/15/13>>> 08/31/13  Zosyn 08/14/13>>>08/31/13  Procedures/Studies:  Ct Abdomen Pelvis Wo Contrast 08/14/2013 14.5 x 18.1 x 26.1 cm fluid collection/abscess within the anterior abdominal wall, as described above, incompletely visualized. The collection appears just anterior to prior ventral hernia mesh. Given the  size and proximity to the skin surface, incision and drainage is suggested.  Dg Chest 2 View 08/01/2013 No active cardiopulmonary disease.  Dg Hip Complete Right 08/11/2013 No acute osseous injury of the right hip.  Ct Angio Chest Pe W/cm &/or Wo Cm 08/01/2013 4.5 cm AAA without complicating features. No evidence of acute pulmonary embolism  or aortic dissection.  Dg Chest Port 1 View 08/18/2013 Right upper extremity PICC terminates in the mid superior vena cava. Stable congestive heart failure pattern.  Dg Chest Port 1 View 08/17/2013 Stable chest with changes of congestive heart failure with pulmonary interstitial edema. Cardiac pacer in stable position.  Dg Chest Portable 1 View 08/15/2013 Stable cardiomegaly with interval worsening of pulmonary vascular congestion/ edema relative to 08/13/2013.  Dg Chest Port 1 View 08/13/2013 No acute findings. Stable cardiomegaly and pulmonary venous prominence without overt edema.  2-D echo 08/12/2013  Incision and drainage of abscess, Gore-Tex mesh removal, debridement of abdominal wall and peritoneum Consultants:  Cardiology: Dr. Terrence Dupont 08/13/2013  General surgery: Dr. Johney Maine 08/14/2013  ID: Dr Megan Salon 08/15/13  HPI/Subjective: No events overnight.   Objective: Filed Vitals:   09/03/13 1422 09/03/13 1654 09/03/13 2026 09/04/13 0413  BP: 135/84 128/67 156/77 165/65  Pulse: 72  61 86  Temp: 98.4 F (36.9 C)  98.7 F (37.1 C) 98.2 F (36.8 C)  TempSrc: Oral  Oral Oral  Resp: 18  19 19   Height:      Weight:    155.4 kg (342 lb 9.5 oz)  SpO2: 99%  95% 98%    Intake/Output Summary (Last 24 hours) at 09/04/13 0649 Last data filed at 09/04/13 0600  Gross per 24 hour  Intake 3556.08 ml  Output   2700 ml  Net 856.08 ml    Exam:   General:  Pt is alert, follows commands appropriately, not in acute distress  Cardiovascular: Regular rate and rhythm, S1/S2, no murmurs, no rubs, no gallops  Respiratory: Clear to auscultation bilaterally, no wheezing, no crackles, no rhonchi  Abdomen: Soft, non tender, open abd wound with drainage   Extremities: No edema, pulses DP and PT palpable bilaterally  Neuro: Grossly nonfocal  Data Reviewed: Basic Metabolic Panel:  Recent Labs Lab 08/30/13 0500 08/31/13 0650 09/01/13 0515 09/02/13 0445 09/04/13 0502  NA 137 134* 139 135 137   K 3.4* 3.6 3.6 3.9 3.8  CL 96 97 101 99 100  CO2 32 30 29 28 27   GLUCOSE 94 107* 56* 101* 127*  BUN 37* 40* 32* 25* 23  CREATININE 1.63* 1.66* 1.35 1.18 1.08  CALCIUM 8.8 8.7 8.8 9.1 9.2  MG 1.9  --   --  1.9  --   PHOS 5.4* 4.0  --  3.2  --    Liver Function Tests:  Recent Labs Lab 08/30/13 0500 09/02/13 0445  AST 17 21  ALT 9 12  ALKPHOS 40 48  BILITOT 0.6 0.8  PROT 7.1 7.3  ALBUMIN 2.0* 2.1*   CBC:  Recent Labs Lab 08/30/13 0500 08/31/13 0650 09/01/13 0515 09/02/13 0445 09/03/13 0600 09/04/13 0502  WBC 7.6 7.8 8.4 7.5 8.1 8.1  NEUTROABS 4.1  --   --   --   --   --   HGB 11.4* 11.4* 11.4* 11.3* 11.6* 11.6*  HCT 33.8* 33.3* 33.9* 33.6* 34.2* 33.9*  MCV 94.7 94.3 93.6 94.1 94.0 93.4  PLT 264 237 269 272 270 279   CBG:  Recent Labs Lab 09/03/13 1158 09/03/13 1638 09/03/13 2031 09/03/13 2325  09/04/13 0412  GLUCAP 136* 125* 112* 108* 127*   Scheduled Meds: . acetaminophen  1,000 mg Oral TID  . antiseptic oral rinse  15 mL Mouth Rinse q12n4p  . apixaban  5 mg Oral BID  . atorvastatin  40 mg Oral QODAY  . carvedilol  3.125 mg Oral BID WC  . furosemide  40 mg Oral Daily  . hydrALAZINE  5 mg Intravenous Q8H  . insulin aspart  0-20 Units Subcutaneous Q4H  . insulin glargine  65 Units Subcutaneous QHS  . lip balm  1 application Topical BID  . loperamide  2 mg Oral QHS  . octreotide  150 mcg Subcutaneous TID  . OxyCODONE  15 mg Oral Q12H  . potassium chloride  40 mEq Oral Daily  . saccharomyces boulardii  250 mg Oral BID  . sodium chloride  3 mL Intravenous Q12H   Continuous Infusions: . sodium chloride 20 mL/hr at 09/04/13 0500  . [name] KitchenTPN (CLINIMIX-E) Adult 125 mL/hr at 09/04/13 0600   And  . fat emulsion 250 mL (09/03/13 1802)     Faye Ramsay, MD  [facility] Pager [phone]  If 7PM-7AM, please contact night-coverage [email or web address] Password [facility] 09/04/2013, 6:49 AM   LOS: 24 days

## 2013-09-05 DIAGNOSIS — N179 Acute kidney failure, unspecified: Secondary | ICD-10-CM

## 2013-09-05 LAB — CBC
Hemoglobin: 11.6 g/dL — ABNORMAL LOW (ref 13.0–17.0)
MCH: 31.4 pg (ref 26.0–34.0)
MCV: 93.5 fL (ref 78.0–100.0)
Platelets: 294 10*3/uL (ref 150–400)
RBC: 3.7 MIL/uL — ABNORMAL LOW (ref 4.22–5.81)
WBC: 7.9 10*3/uL (ref 4.0–10.5)

## 2013-09-05 LAB — GLUCOSE, CAPILLARY
Glucose-Capillary: 102 mg/dL — ABNORMAL HIGH (ref 70–99)
Glucose-Capillary: 189 mg/dL — ABNORMAL HIGH (ref 70–99)
Glucose-Capillary: 193 mg/dL — ABNORMAL HIGH (ref 70–99)

## 2013-09-05 MED ORDER — FAT EMULSION 20 % IV EMUL
250.0000 mL | INTRAVENOUS | Status: AC
Start: 2013-09-05 — End: 2013-09-06
  Administered 2013-09-05: 18:00:00 250 mL via INTRAVENOUS
  Filled 2013-09-05: qty 250

## 2013-09-05 MED ORDER — M.V.I. ADULT IV INJ
INTRAVENOUS | Status: AC
  Administered 2013-09-05: 18:00:00 via INTRAVENOUS
  Filled 2013-09-05: qty 3000

## 2013-09-05 NOTE — Progress Notes (Signed)
Drainage from eakin pouch has been much thicker today that I've had to empty from the pouch instead of suction.

## 2013-09-05 NOTE — Progress Notes (Signed)
PARENTERAL NUTRITION CONSULT NOTE - FOLLOW UP  Pharmacy Consult for TNA Indication: Enterocutaneous fistula  No Known Allergies  Patient Measurements: Height: 6' (182.9 cm) Weight: 352 lb 1.2 oz (159.7 kg) IBW/kg (Calculated) : 77.6 Adjusted Body Weight: 102.6kg  Vital Signs: Temp: 98.7 F (37.1 C) (11/30 0450) Temp src: Axillary (11/30 0450) BP: 154/68 mmHg (11/30 0450) Pulse Rate: 77 (11/30 0450) Intake/Output from previous day: 11/29 0701 - 11/30 0700 In: 3705 [I.V.:765.8; TPN:2939.3] Out: 2175 [Urine:1775; Drains:400]  Labs:  Recent Labs  09/03/13 0600 09/04/13 0502 09/05/13 0505  WBC 8.1 8.1 7.9  HGB 11.6* 11.6* 11.6*  HCT 34.2* 33.9* 34.6*  PLT 270 279 294    Recent Labs  09/04/13 0502  NA 137  K 3.8  CL 100  CO2 27  GLUCOSE 127*  BUN 23  CREATININE 1.08  CALCIUM 9.2   Estimated Creatinine Clearance: 113.6 ml/min (by C-G formula based on Cr of 1.08).    Recent Labs  09/04/13 2359 09/05/13 0418 09/05/13 0742  GLUCAP 189* 102* 111*    Medications:  Infusions:  . sodium chloride 20 mL/hr at 09/05/13 0132  . [name] KitchenTPN (CLINIMIX-E) Adult 125 mL/hr at 09/04/13 1817   And  . fat emulsion 250 mL (09/04/13 1818)    Insulin Requirements in the past 24 hours:   Lantus 65units sq qHS   17 units SSI required  - CBG q4h with resistant scale coverage  50 units regular insulin added to TNA   Nutritional Goals:   Per RD 11/13:  2400-2700 kcal/day, 160-180 grams/day protein, 3.3-3.5 L/day fluid  Clinimix E5/15 at goal rate 125 ml/hr (3L/day) with 20% lipids at 10 ml/hr to provide 150g protein and 2675 kcal daily.  Due to how much fluid can be placed in TNA bag, Max volume is 3L.   Current Nutrition:   NPO  Clinimix 5/15 at 125 ml/hr and 20% lipids at 55ml/hr   IVF: NS at [place]: 62 yo male with large abdominal abscess with infected old Gore Tex mesh s/p I&D abscess, removal of mesh, debridement of abdominal wall and peritoneum. Now  with new small bowel enterocutaneous fistula. TNA started 11/12 at 10pm.  Labs from 11/29  Glucose: Hx DM on insulin PTA, CBG improved with addition of insulin to TNA 11/16 and increased Lantus doses.   One elevated reading yesterday evening, may have been secondary to D10 running at 125 ml/hr for a few hours as TNA fell out while patient was showering, per protocol D10 was started  Electrolytes:  WNL on 11/29  SCr: 1.08 (improved), on lasix 40mg  IV daily on KCl 89meq daily  LFTs: wnl 11/27, albumin only 2.1  TGs: wnl, 124 (11/13), 89 (11/17), 99 (11/24)  Prealbumin: low, 10.3 (11/13), 11.1 (11/17), 11.2(11/24)  Monitor fluid status very closely since TNA at high goal rate (TNA + lipids + IVF = 155 ml/hr).  I/Os documented, but unsure of accuracy of documentation.  Octreotide started 11/17 to control fistula output. Lasix decreased from 80mg  BID to 40mg  BID on 11/24 due to rising SCr (watch K+ since remains on same KCl supplement with dec loop dose).   TPN Access: Triple lumen PICC placed 11/12 TPN day#: 19  Plan:   Continue Clinimix E 5/15 at goal rate of 145ml/hr + lipids 20% 33ml/hr  Continue insulin 50 units per 3L bag (16.7 units/L)  Continue Lantus 65 units qhs  KCl 56mEq PO daily per MD  TNA to contain standard multivitamins and trace elements.  Continue NS at [employer] lab panels on Mondays & Thursdays  Glee Arvin PharmD Pager #: [phone] 10:03 AM 09/05/2013

## 2013-09-05 NOTE — Progress Notes (Signed)
Patient ID: Kurt Cross, male   DOB: April 06, 1951, 62 y.o.   MRN: [id number]  [facility] PROGRESS NOTE  Kurt Cross [id number] DOB: 1951/05/04 DOA: 08/11/2013 PCP: [name], MD  Brief narrative:  61 y.o. male with a remote history of bowel perforation requiring exploratory laparotomy and temporary colostomy. He then developed a ventral hernia and underwent mesh repair decades ago. He was recently hospitalized and treated for possible pneumonia. He had a fall at home recently and injured his right lower leg leading to readmission. He was noted to be febrile with some abdominal wall swelling. CT scan revealed a large abdominal wall abscess. 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 He was placed on bowel rest and started on TPN. Early in the admission, the patient was fluid overloaded and received intravenous furosemide. He has been since transitioned back to his home dose of oral furosemide and remains clinically stable from CHF standpoint.   Assessment/Plan:  #1 infected prosthetic Gore-Tex mesh with a giant abdominal abscess status post IandD and Gore-Tex mesh removal 08/14/2013 now with enterocut fistula  - Patient is status post I&D Gore-Tex mesh removal, debridement of abdominal wall and peritoneum 08/14/2013.  - Blood cultures with no growth to date  - now with EC fistula-->started on sandostatin by surgery 08/23/13 and dose increased.  - Continue sandostatin, TNA, wound care, supportiver care.  #2 sepsis  - secondary to problem #1.  - Wound cultures prelim with GPC in pairs and clusters-->Coag Neg Staph  #3 HTN  - improving, Hydralazine added 11/27  - will continue Coreg, Hydralazine, Lasix  - if SBP remains persistently elevated above 150 will increase the dose of Hydralazine  #4 atrial fibrillation/atrial flutter  - secondary to problem #1. Now ressolved, CE x 3 neg, 2 D ECHO EF  55%  - currently rate controlled, continue Coreg  - CHADSVASc score is 4 (not ASVD plaque on abd aorta), resumed eliquis  #5 chronic kidney disease stage III  - Creatinine is within normal limits this AM  #6 leukocytosis/fever  - secondary to problem #1  - pt remains afebrile over 72 hours and WBC is within normal limits  #7 chronic combined systolic and diastolic CHF  - Initial CXR with volume overload. given 4 doses of lasix 40mg  IV  -08/12/2013 ECHO EF 50-55% (EF improved compared to one year ago (35-40%))  - weight trending down 356 lbs --> 248 --> 333 --> 330 --> 250 --> 342 --> 352 lbs this AM  #8 Uncontrolled Pain  - continue analgesia as needed  #9 hyperlipidemia  - Continue statin.  #10 4.5 cm ascending aortic aneurysm per CT scan of 08/01/2013  - Stable. Asymptomatic. Continue risk factor modification with good blood pressure control.  #11 type 2 diabetes  - Hemoglobin A1c 9.5 08/02/2013.Continue Lantus 65 units daily.  - Sliding scale insulin.  #12. Hypomagnesemia  - Repleted  #13 ?? Pauses  - Patient asymptomatic. If HR decreases will decrease coreg. Cardiology notified and ICD has been interrogated.   Family Communication: Updated patient, no family at beside  Disposition Plan: To be determined   Antibiotics  Vancomycin 08/15/13>>> 08/31/13  Zosyn 08/14/13>>>08/31/13  Procedures/Studies:  Ct Abdomen Pelvis Wo Contrast 08/14/2013 14.5 x 18.1 x 26.1 cm fluid collection/abscess within the anterior abdominal wall, as described above, incompletely visualized. The collection appears just anterior to prior ventral hernia mesh.  Given the size and proximity to the skin surface, incision and drainage is suggested.  Dg Chest 2 View 08/01/2013 No active cardiopulmonary disease.  Dg Hip Complete Right 08/11/2013 No acute osseous injury of the right hip.  Ct Angio Chest Pe W/cm &/or Wo Cm 08/01/2013 4.5 cm AAA without complicating features. No evidence of acute pulmonary embolism or  aortic dissection.  Dg Chest Port 1 View 08/18/2013 Right upper extremity PICC terminates in the mid superior vena cava. Stable congestive heart failure pattern.  Dg Chest Port 1 View 08/17/2013 Stable chest with changes of congestive heart failure with pulmonary interstitial edema. Cardiac pacer in stable position.  Dg Chest Portable 1 View 08/15/2013 Stable cardiomegaly with interval worsening of pulmonary vascular congestion/ edema relative to 08/13/2013.  Dg Chest Port 1 View 08/13/2013 No acute findings. Stable cardiomegaly and pulmonary venous prominence without overt edema.  2-D echo 08/12/2013  Incision and drainage of abscess, Gore-Tex mesh removal, debridement of abdominal wall and peritoneum Consultants:  Cardiology: Dr. Terrence Dupont 08/13/2013  General surgery: Dr. Johney Maine 08/14/2013  ID: Dr Megan Salon 08/15/13  HPI/Subjective: No events overnight.   Objective: Filed Vitals:   09/04/13 1444 09/04/13 2028 09/04/13 2303 09/05/13 0450  BP: 147/71 171/69  154/68  Pulse: 77 71 74 77  Temp: 98.8 F (37.1 C) 99.4 F (37.4 C)  98.7 F (37.1 C)  TempSrc: Oral Oral  Axillary  Resp: 18 18 20 20   Height:      Weight:    159.7 kg (352 lb 1.2 oz)  SpO2: 99% 92%  93%    Intake/Output Summary (Last 24 hours) at 09/05/13 1116 Last data filed at 09/05/13 0651  Gross per 24 hour  Intake   3705 ml  Output   1350 ml  Net   2355 ml    Exam:   General:  Pt is alert, follows commands appropriately, not in acute distress  Cardiovascular: Regular rate and rhythm, S1/S2, no murmurs, no rubs, no gallops  Respiratory: Clear to auscultation bilaterally, no wheezing, no crackles, no rhonchi  Abdomen: Soft, non tender, non distended, open abdominal wound with draining  Extremities: No edema, pulses DP and PT palpable bilaterally  Neuro: Grossly nonfocal  Data Reviewed: Basic Metabolic Panel:  Recent Labs Lab 08/30/13 0500 08/31/13 0650 09/01/13 0515 09/02/13 0445 09/04/13 0502  NA 137  134* 139 135 137  K 3.4* 3.6 3.6 3.9 3.8  CL 96 97 101 99 100  CO2 32 30 29 28 27   GLUCOSE 94 107* 56* 101* 127*  BUN 37* 40* 32* 25* 23  CREATININE 1.63* 1.66* 1.35 1.18 1.08  CALCIUM 8.8 8.7 8.8 9.1 9.2  MG 1.9  --   --  1.9  --   PHOS 5.4* 4.0  --  3.2  --    Liver Function Tests:  Recent Labs Lab 08/30/13 0500 09/02/13 0445  AST 17 21  ALT 9 12  ALKPHOS 40 48  BILITOT 0.6 0.8  PROT 7.1 7.3  ALBUMIN 2.0* 2.1*   CBC:  Recent Labs Lab 08/30/13 0500  09/01/13 0515 09/02/13 0445 09/03/13 0600 09/04/13 0502 09/05/13 0505  WBC 7.6  < > 8.4 7.5 8.1 8.1 7.9  NEUTROABS 4.1  --   --   --   --   --   --   HGB 11.4*  < > 11.4* 11.3* 11.6* 11.6* 11.6*  HCT 33.8*  < > 33.9* 33.6* 34.2* 33.9* 34.6*  MCV 94.7  < > 93.6 94.1 94.0 93.4 93.5  PLT 264  < > 269 272 270 279 294  < > = values in this interval not displayed.  CBG:  Recent Labs Lab 09/04/13 1655 09/04/13 2030 09/04/13 2359 09/05/13 0418 09/05/13 0742  GLUCAP 119* 213* 189* 102* 111*   Scheduled Meds: . acetaminophen  1,000 mg Oral TID  . antiseptic oral rinse  15 mL Mouth Rinse q12n4p  . apixaban  5 mg Oral BID  . atorvastatin  40 mg Oral QODAY  . carvedilol  3.125 mg Oral BID WC  . furosemide  40 mg Oral Daily  . hydrALAZINE  5 mg Intravenous Q8H  . insulin aspart  0-20 Units Subcutaneous Q4H  . insulin glargine  65 Units Subcutaneous QHS  . lip balm  1 application Topical BID  . loperamide  2 mg Oral QHS  . octreotide  150 mcg Subcutaneous TID  . OxyCODONE  15 mg Oral Q12H  . potassium chloride  40 mEq Oral Daily  . saccharomyces boulardii  250 mg Oral BID  . sodium chloride  3 mL Intravenous Q12H   Continuous Infusions: . sodium chloride 20 mL/hr at 09/05/13 0132  . [name] KitchenTPN (CLINIMIX-E) Adult 125 mL/hr at 09/04/13 1817   And  . fat emulsion 250 mL (09/04/13 1818)  . [name] KitchenTPN (CLINIMIX-E) Adult     And  . fat emulsion     Faye Ramsay, MD  [facility] Pager [phone]  If 7PM-7AM, please contact  night-coverage [email or web address] Password [email or web address] 09/05/2013, 11:16 AM   LOS: 25 days

## 2013-09-05 NOTE — Progress Notes (Signed)
Pt placed on cpap 7.0 cm h20 full face mask with 2 lpm 02 bled in.  Tolerating well

## 2013-09-06 LAB — DIFFERENTIAL
Basophils Relative: 0 % (ref 0–1)
Eosinophils Absolute: 0.4 10*3/uL (ref 0.0–0.7)
Eosinophils Relative: 5 % (ref 0–5)
Monocytes Absolute: 1.4 10*3/uL — ABNORMAL HIGH (ref 0.1–1.0)
Monocytes Relative: 17 % — ABNORMAL HIGH (ref 3–12)
Neutrophils Relative %: 49 % (ref 43–77)

## 2013-09-06 LAB — GLUCOSE, CAPILLARY
Glucose-Capillary: 106 mg/dL — ABNORMAL HIGH (ref 70–99)
Glucose-Capillary: 158 mg/dL — ABNORMAL HIGH (ref 70–99)

## 2013-09-06 LAB — COMPREHENSIVE METABOLIC PANEL
ALT: 41 U/L (ref 0–53)
AST: 49 U/L — ABNORMAL HIGH (ref 0–37)
Albumin: 2.1 g/dL — ABNORMAL LOW (ref 3.5–5.2)
Alkaline Phosphatase: 70 U/L (ref 39–117)
BUN: 23 mg/dL (ref 6–23)
CO2: 25 mEq/L (ref 19–32)
Chloride: 101 mEq/L (ref 96–112)
GFR calc non Af Amer: 77 mL/min — ABNORMAL LOW (ref >90)
Potassium: 3.7 mEq/L (ref 3.5–5.1)
Sodium: 136 mEq/L (ref 135–145)
Total Bilirubin: 1.2 mg/dL (ref 0.3–1.2)
Total Protein: 7.2 g/dL (ref 6.0–8.3)

## 2013-09-06 LAB — CBC
HCT: 32.6 % — ABNORMAL LOW (ref 39.0–52.0)
Hemoglobin: 11.1 g/dL — ABNORMAL LOW (ref 13.0–17.0)
MCH: 31.6 pg (ref 26.0–34.0)
MCHC: 34 g/dL (ref 30.0–36.0)
MCV: 92.9 fL (ref 78.0–100.0)
RBC: 3.51 MIL/uL — ABNORMAL LOW (ref 4.22–5.81)

## 2013-09-06 LAB — MAGNESIUM: Magnesium: 1.7 mg/dL (ref 1.5–2.5)

## 2013-09-06 LAB — TRIGLYCERIDES: Triglycerides: 92 mg/dL (ref <150)

## 2013-09-06 MED ORDER — ZOLPIDEM TARTRATE 10 MG PO TABS
10.0000 mg | ORAL_TABLET | Freq: Every day | ORAL | Status: DC
  Administered 2013-09-06 – 2013-10-14 (×39): 10 mg via ORAL
  Filled 2013-09-06 (×39): qty 1

## 2013-09-06 MED ORDER — TRACE MINERALS CR-CU-F-FE-I-MN-MO-SE-ZN IV SOLN
INTRAVENOUS | Status: AC
  Administered 2013-09-06: 19:00:00 via INTRAVENOUS
  Filled 2013-09-06: qty 3000

## 2013-09-06 MED ORDER — FAT EMULSION 20 % IV EMUL
250.0000 mL | INTRAVENOUS | Status: AC
  Administered 2013-09-06: 250 mL via INTRAVENOUS
  Filled 2013-09-06 (×2): qty 250

## 2013-09-06 MED ORDER — LOPERAMIDE HCL 2 MG PO CAPS
2.0000 mg | ORAL_CAPSULE | Freq: Two times a day (BID) | ORAL | Status: DC
  Administered 2013-09-06 – 2013-09-13 (×14): 2 mg via ORAL
  Filled 2013-09-06 (×18): qty 1

## 2013-09-06 MED ORDER — FERROUS SULFATE 325 (65 FE) MG PO TABS
325.0000 mg | ORAL_TABLET | Freq: Two times a day (BID) | ORAL | Status: DC
  Administered 2013-09-06 – 2013-09-24 (×36): 325 mg via ORAL
  Filled 2013-09-06 (×37): qty 1

## 2013-09-06 NOTE — Progress Notes (Signed)
PARENTERAL NUTRITION CONSULT NOTE - FOLLOW UP  Pharmacy Consult for TNA Indication: Enterocutaneous fistula  No Known Allergies  Patient Measurements: Height: 6' (182.9 cm) Weight: 347 lb 14.2 oz (157.8 kg) IBW/kg (Calculated) : 77.6 Adjusted Body Weight: 102.6kg  Vital Signs: Temp: 98.9 F (37.2 C) (12/01 0600) Temp src: Oral (12/01 0600) BP: 140/68 mmHg (12/01 0600) Pulse Rate: 77 (12/01 0600) Intake/Output from previous day: 11/30 0701 - 12/01 0700 In: 3658.3 [P.O.:60; I.V.:473; TPN:3125.3] Out: 2375 [Urine:1825; Drains:550]  Labs:  Recent Labs  09/04/13 0502 09/05/13 0505 09/06/13 0400  WBC 8.1 7.9 8.1  HGB 11.6* 11.6* 11.1*  HCT 33.9* 34.6* 32.6*  PLT 279 294 293    Recent Labs  09/04/13 0502 09/06/13 0400  NA 137 136  K 3.8 3.7  CL 100 101  CO2 27 25  GLUCOSE 127* 89  BUN 23 23  CREATININE 1.08 1.03  CALCIUM 9.2 8.8  MG  --  1.7  PHOS  --  4.4  PROT  --  7.2  ALBUMIN  --  2.1*  AST  --  49*  ALT  --  41  ALKPHOS  --  70  BILITOT  --  1.2  TRIG  --  92   Estimated Creatinine Clearance: 118.3 ml/min (by C-G formula based on Cr of 1.03).    Recent Labs  09/06/13 0013 09/06/13 0410 09/06/13 0721  GLUCAP 83 97 106*    Medications:  Infusions:  . sodium chloride 20 mL/hr at 09/05/13 2319  . [name] KitchenTPN (CLINIMIX-E) Adult 125 mL/hr at 09/05/13 1801   And  . fat emulsion 250 mL (09/05/13 1801)    Insulin Requirements in the past 24 hours:   Lantus 65units sq qHS   11 units SSI required  - CBG q4h with resistant scale coverage  50 units regular insulin added to TNA   Nutritional Goals:   Per RD 11/13:  2400-2700 kcal/day, 160-180 grams/day protein, 3.3-3.5 L/day fluid  Clinimix E5/15 at goal rate 125 ml/hr (3L/day) with 20% lipids at 10 ml/hr to provide 150g protein and 2675 kcal daily.  Due to how much fluid can be placed in TNA bag, Max volume is 3L.   Current Nutrition:   NPO  Clinimix 5/15 at 125 ml/hr and 20% lipids at 9ml/hr    IVF: NS at [facility]: 62 yo male with large abdominal abscess with infected old Gore Tex mesh s/p I&D abscess, removal of mesh, debridement of abdominal wall and peritoneum. 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 TNA started 11/12 at 10pm.  Today is Day#20 TNA and pt will likely be discharged eventually with TNA.  Discussed with surgery and ok to start cycling TNA from 24 hours to 18 hours tonight.    Labs from 11/29  Glucose: Hx DM on insulin PTA, CBG improved with addition of insulin to TNA 11/16 and increased Lantus doses.  CBGs at goal last 24 hours.   Electrolytes:  WNL   SCr ok, on po lasix 40mg  and KCl 40 mEq daily. UOP 0.5 ml/kg/hr.   LFTs: small bump in AST, albumin only 2.1  TGs: wnl, 124 (11/13), 89 (11/17), 99 (11/24), 92 (12/1)  Prealbumin: low, 10.3 (11/13), 11.1 (11/17), 11.2(11/24), 12/1 - pending  Monitor fluid status very closely since TNA at high goal rate (TNA + lipids + IVF = 155 ml/hr).   Octreotide started 11/17 to control fistula output.   TPN Access: Triple lumen PICC placed 11/12 TPN day#: 20  Plan:  Change to cyclic 18 hour infusion of Clinimix E 5/15, starting at 82ml/hr x 1 hour, then 181 ml/hr x 16 hours, then 50 ml/hr x 1 hr, then off.    Start lipids 20% at 14 ml/hr x 18 hours.    Continue insulin 50 units per 3L bag (16.7 units/L) and Lantus 65 units qhs  CBG checks q 4 hours.  F/u closely and adjust insulin tomorrow as necessary after monitoring CBGs.    KCl 3mEq PO daily per MD  TNA to contain standard multivitamins and trace elements.  Continue NS at [facility] on Mondays & Thursdays  Ralene Bathe, PharmD 09/06/2013, 10:57 AM  Pager: [phone]

## 2013-09-06 NOTE — Progress Notes (Signed)
CSW continues to follow for any needs. Lillie Portner C. [place] MSW, [facility]

## 2013-09-06 NOTE — Progress Notes (Signed)
PT Cancellation Note  Patient Details Name: Kurt Cross MRN: [id number] DOB: Oct 16, 1950   Cancelled Treatment:    Reason Eval/Treat Not Completed: Patient declined, no reason specified. Pt stated he'd walk later. Will follow.   Blondell Reveal Kistler 09/06/2013, 12:37 PM [phone]

## 2013-09-06 NOTE — Progress Notes (Signed)
Patient ID: Kurt Cross, male   DOB: 1951-06-03, 62 y.o.   MRN: [id number] [facility] PROGRESS NOTE  Kurt Cross [id number] DOB: October 09, 1950 DOA: 08/11/2013 PCP: [name], MD  Brief narrative:  62 y.o. male with a remote history of bowel perforation requiring exploratory laparotomy and temporary colostomy. He then developed a ventral hernia and underwent mesh repair decades ago. He was recently hospitalized and treated for possible pneumonia. He had a fall at home recently and injured his right lower leg leading to readmission. He was noted to be febrile with some abdominal wall swelling. CT scan revealed a large abdominal wall abscess. 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 He was placed on bowel rest and started on TPN. Early in the admission, the patient was fluid overloaded and received intravenous furosemide. He has been since transitioned back to his home dose of oral furosemide and remains clinically stable from CHF standpoint.   Assessment/Plan:  #1 infected prosthetic Gore-Tex mesh with a giant abdominal abscess status post IandD and Gore-Tex mesh removal 08/14/2013 now with enterocut fistula  - Patient is status post I&D Gore-Tex mesh removal, debridement of abdominal wall and peritoneum 08/14/2013.  - Blood cultures with no growth to date  - now with EC fistula-->started on sandostatin by surgery 08/23/13 and dose increased.  - Continue sandostatin, TNA, wound care, supportiver care.  - ? Candidate for wound vac, will follow up on surgery recommendations  #2 sepsis  - secondary to problem #1.  - Wound cultures prelim with GPC in pairs and clusters-->Coag Neg Staph  #3 HTN  - improving, Hydralazine added 11/27  - will continue Coreg, Hydralazine, Lasix  - if SBP remains persistently elevated above 150 will increase the dose of Hydralazine  #4 atrial fibrillation/atrial  flutter  - secondary to problem #1. Now ressolved, CE x 3 neg, 2 D ECHO EF 55%  - currently rate controlled, continue Coreg  - CHADSVASc score is 4 (not ASVD plaque on abd aorta), resumed eliquis  #5 chronic kidney disease stage III  - Creatinine is within normal limits this AM  #6 leukocytosis/fever  - secondary to problem #1  - pt remains afebrile over 72 hours and WBC is within normal limits  #7 chronic combined systolic and diastolic CHF  - Initial CXR with volume overload. given 4 doses of lasix 40mg  IV  -08/12/2013 ECHO EF 50-55% (EF improved compared to one year ago (35-40%))  - weight trending down 356 lbs --> 248 --> 333 --> 330 --> 250 --> 342 --> 352 --> 347 lbs this AM #8 Uncontrolled Pain  - continue analgesia as needed  #9 hyperlipidemia  - Continue statin.  #10 4.5 cm ascending aortic aneurysm per CT scan of 08/01/2013  - Stable. Asymptomatic. Continue risk factor modification with good blood pressure control.  #11 type 2 diabetes  - Hemoglobin A1c 9.5 08/02/2013.Continue Lantus 65 units daily.  - Sliding scale insulin.  #12. Hypomagnesemia  - Repleted  #13 ?? Pauses  - Patient asymptomatic. If HR decreases will decrease coreg. Cardiology notified and ICD has been interrogated.   Family Communication: Updated patient, no family at beside  Disposition Plan: To be determined, ? SNF vs home, waiting to see if pt would be wound vac candidate    Antibiotics  Vancomycin 08/15/13>>> 08/31/13  Zosyn 08/14/13>>>08/31/13  Procedures/Studies:  Ct Abdomen Pelvis Wo Contrast 08/14/2013  14.5 x 18.1 x 26.1 cm fluid collection/abscess within the anterior abdominal wall, as described above, incompletely visualized. The collection appears just anterior to prior ventral hernia mesh. Given the size and proximity to the skin surface, incision and drainage is suggested.  Dg Chest 2 View 08/01/2013 No active cardiopulmonary disease.  Dg Hip Complete Right 08/11/2013 No acute osseous injury of  the right hip.  Ct Angio Chest Pe W/cm &/or Wo Cm 08/01/2013 4.5 cm AAA without complicating features. No evidence of acute pulmonary embolism or aortic dissection.  Dg Chest Port 1 View 08/18/2013 Right upper extremity PICC terminates in the mid superior vena cava. Stable congestive heart failure pattern.  Dg Chest Port 1 View 08/17/2013 Stable chest with changes of congestive heart failure with pulmonary interstitial edema. Cardiac pacer in stable position.  Dg Chest Portable 1 View 08/15/2013 Stable cardiomegaly with interval worsening of pulmonary vascular congestion/ edema relative to 08/13/2013.  Dg Chest Port 1 View 08/13/2013 No acute findings. Stable cardiomegaly and pulmonary venous prominence without overt edema.  2-D echo 08/12/2013  Incision and drainage of abscess, Gore-Tex mesh removal, debridement of abdominal wall and peritoneum Consultants:  Cardiology: Dr. Terrence Dupont 08/13/2013  General surgery: Dr. Johney Maine 08/14/2013  ID: Dr Megan Salon 08/15/13  HPI/Subjective: No events overnight.   Objective: Filed Vitals:   09/05/13 1739 09/05/13 2020 09/05/13 2307 09/06/13 0600  BP: 152/80 151/76  140/68  Pulse: 73 79  77  Temp: 98.4 F (36.9 C) 99.1 F (37.3 C)  98.9 F (37.2 C)  TempSrc: Oral Oral  Oral  Resp: 20 24  20   Height:      Weight:    157.8 kg (347 lb 14.2 oz)  SpO2: 98% 100% 96% 100%    Intake/Output Summary (Last 24 hours) at 09/06/13 1228 Last data filed at 09/06/13 1000  Gross per 24 hour  Intake 4328.25 ml  Output   2375 ml  Net 1953.25 ml    Exam:   General:  Pt is alert, follows commands appropriately, not in acute distress  Cardiovascular: Regular rate and rhythm, S1/S2, no murmurs, no rubs, no gallops  Respiratory: Clear to auscultation bilaterally, no wheezing, no crackles, no rhonchi  Abdomen: Soft, non distended, bowel sounds present, no guarding, open abdominal wound with draining   Extremities: No edema, pulses DP and PT palpable  bilaterally  Neuro: Grossly nonfocal  Data Reviewed: Basic Metabolic Panel:  Recent Labs Lab 08/31/13 0650 09/01/13 0515 09/02/13 0445 09/04/13 0502 09/06/13 0400  NA 134* 139 135 137 136  K 3.6 3.6 3.9 3.8 3.7  CL 97 101 99 100 101  CO2 30 29 28 27 25   GLUCOSE 107* 56* 101* 127* 89  BUN 40* 32* 25* 23 23  CREATININE 1.66* 1.35 1.18 1.08 1.03  CALCIUM 8.7 8.8 9.1 9.2 8.8  MG  --   --  1.9  --  1.7  PHOS 4.0  --  3.2  --  4.4   Liver Function Tests:  Recent Labs Lab 09/02/13 0445 09/06/13 0400  AST 21 49*  ALT 12 41  ALKPHOS 48 70  BILITOT 0.8 1.2  PROT 7.3 7.2  ALBUMIN 2.1* 2.1*   CBC:  Recent Labs Lab 09/02/13 0445 09/03/13 0600 09/04/13 0502 09/05/13 0505 09/06/13 0400  WBC 7.5 8.1 8.1 7.9 8.1  NEUTROABS  --   --   --   --  3.9  HGB 11.3* 11.6* 11.6* 11.6* 11.1*  HCT 33.6* 34.2* 33.9* 34.6* 32.6*  MCV 94.1 94.0  93.4 93.5 92.9  PLT 272 270 279 294 293   CBG:  Recent Labs Lab 09/05/13 2019 09/06/13 0013 09/06/13 0410 09/06/13 0721 09/06/13 1214  GLUCAP 127* 83 97 106* 134*   Scheduled Meds: . acetaminophen  1,000 mg Oral TID  . antiseptic oral rinse  15 mL Mouth Rinse q12n4p  . apixaban  5 mg Oral BID  . atorvastatin  40 mg Oral QODAY  . carvedilol  3.125 mg Oral BID WC  . furosemide  40 mg Oral Daily  . hydrALAZINE  5 mg Intravenous Q8H  . insulin aspart  0-20 Units Subcutaneous Q4H  . insulin glargine  65 Units Subcutaneous QHS  . lip balm  1 application Topical BID  . loperamide  2 mg Oral QHS  . octreotide  150 mcg Subcutaneous TID  . OxyCODONE  15 mg Oral Q12H  . potassium chloride  40 mEq Oral Daily  . saccharomyces boulardii  250 mg Oral BID  . sodium chloride  3 mL Intravenous Q12H   Continuous Infusions: . sodium chloride 20 mL/hr at 09/05/13 2319  . [name] KitchenTPN (CLINIMIX-E) Adult 125 mL/hr at 09/05/13 1801   And  . fat emulsion 250 mL (09/05/13 1801)  . [name] KitchenTPN (CLINIMIX-E) Adult     And  . fat emulsion     Faye Ramsay, MD  [facility] Pager [phone]  If 7PM-7AM, please contact night-coverage [email or web address] Password [email or web address] 09/06/2013, 12:28 PM   LOS: 26 days

## 2013-09-06 NOTE — Consult Note (Addendum)
WOC wound follow up Patient seen today in routine follow up for fistula management and at the request of Will Creig Hines, CCS PA as well as Care Management personnel. Wife in room. Patient's wound management system is intact, so I will not change today; unsure if weekend staff applied this pouch or not as there is no date written on the border or pouch film.  Effluent has thickened and some manipulation is required to keep red rubber catheter patent. For this reason, I do not feel that NPWT (V.A.C.) would be effective.  The amount of mucus present contributes to the effluent's current viscosity and would render the V.A.C. system ineffective with tubing clogged and requiring additional unplanned system changes. I am unsure that home care with intermittent suction (Gomco) to the wound would be effective; in my experience it is best used for respiratory (intermittent suction) purposes. As the viscosity of the effluent increases, irrigation and frequent manipulation of the catheter is needed-even with continuous suction here in acute care. I have some concern that the current pouching system (with thickening effluent) will not adhere; note that is is placed sideways, with the drain spout to the right side.  This means there is a diminished capacity and it will need to be emptied whenever a significant amount collects in the pouch or it will pull away from the body requiring application of a new system. The wound is still too large for a standard ostomy or smaller wound management system. Additionally, patient and I discussed pain management. He believes that wound will heal faster without pain medicine.  Patient education provided regarding using pain medication so that he is able to move and prevent pressure ulceration and take deep breaths to prevent pneumonia, also misconception that pain medication has any impact on wound healing. Patient and wife verbalize understanding. [place] nursing team will continue to follow  along with you. Thanks, Maudie Flakes, MSN, RN, [place], [place], [place] [phone]).

## 2013-09-06 NOTE — Progress Notes (Addendum)
23 Days Post-Op  Subjective: No real change from last week.  He has some thick drainage in his Eakin's pouch. I have tried to irrigate, but he may need a new red robbins catheter to empty the bag.  Objective: Vital signs in last 24 hours: Temp:  [98.4 F (36.9 C)-99.1 F (37.3 C)] 98.9 F (37.2 C) (12/01 0600) Pulse Rate:  [72-79] 77 (12/01 0600) Resp:  [19-24] 20 (12/01 0600) BP: (140-166)/(68-86) 140/68 mmHg (12/01 0600) SpO2:  [96 %-100 %] 100 % (12/01 0600) Weight:  [157.8 kg (347 lb 14.2 oz)] 157.8 kg (347 lb 14.2 oz) (12/01 0600) Last BM Date: 09/01/13 NPO 550 from the Drain/Eakins pouch yesterday recorded. Afebrile, VSS Labs OK Intake/Output from previous day: 11/30 0701 - 12/01 0700 In: 3658.3 [P.O.:60; I.V.:473; TPN:3125.3] Out: 2375 [Urine:1825; Drains:550] Intake/Output this shift: Total I/O In: 670 [P.O.:50; I.V.:80; TPN:540] Out: -   General appearance: alert, cooperative and no distress GI: soft, non-tender; bowel sounds normal; no masses,  no organomegaly and Open area with thick yellowish-green drainage.  Lab Results:   Recent Labs  09/05/13 0505 09/06/13 0400  WBC 7.9 8.1  HGB 11.6* 11.1*  HCT 34.6* 32.6*  PLT 294 293    BMET  Recent Labs  09/04/13 0502 09/06/13 0400  NA 137 136  K 3.8 3.7  CL 100 101  CO2 27 25  GLUCOSE 127* 89  BUN 23 23  CREATININE 1.08 1.03  CALCIUM 9.2 8.8   PT/INR No results found for this basename: LABPROT, INR,  in the last 72 hours   Recent Labs Lab 09/02/13 0445 09/06/13 0400  AST 21 49*  ALT 12 41  ALKPHOS 48 70  BILITOT 0.8 1.2  PROT 7.3 7.2  ALBUMIN 2.1* 2.1*     Lipase  No results found for this basename: lipase     Studies/Results: No results found.  Medications: . acetaminophen  1,000 mg Oral TID  . antiseptic oral rinse  15 mL Mouth Rinse q12n4p  . apixaban  5 mg Oral BID  . atorvastatin  40 mg Oral QODAY  . carvedilol  3.125 mg Oral BID WC  . furosemide  40 mg Oral Daily  .  hydrALAZINE  5 mg Intravenous Q8H  . insulin aspart  0-20 Units Subcutaneous Q4H  . insulin glargine  65 Units Subcutaneous QHS  . lip balm  1 application Topical BID  . loperamide  2 mg Oral QHS  . octreotide  150 mcg Subcutaneous TID  . OxyCODONE  15 mg Oral Q12H  . potassium chloride  40 mEq Oral Daily  . saccharomyces boulardii  250 mg Oral BID  . sodium chloride  3 mL Intravenous Q12H    Assessment/Plan Infected prosthetic mesh of abdominal wall with GIANT abscess s/p removal 08/14/2013 Dr. Michael Boston  Day 18- Vancomycin and Zosyn (stopped on 08/31/13) Entero-atmospheric fistula-on bowel rest, Sandostatin and TPN; fistula output has significantly increased since the drainage system was fixed.  Diabetes mellitus, insulin dependent (IDDM), uncontrolled  HYPERLIPIDEMIA  Obesity, Class III, BMI 40-49.9 (morbid obesity)  OBSTRUCTIVE SLEEP APNEA  Acute renal insufficiency from fistula/wound loss-improved   Plan:  Dr. Hassell Done would like to try a Wound Vac to the site.  He is agreeable to cyclic TNA.  I will get a hold of the Wound Care Nurse covering [facility] and discuss wound Vac.  I think we would need to see if this is workable, but if it is, transfer to home or SNF would be much  easier.  He will need some type of drainage, for months to come.  We cannot predict when these fistulas may heal.  I had Wound care look at this and she agree's with me that the effluent is just to thick to place a wound vac on.  Currently the red rubber catheter is barely able to drain it without flushing and manipulation.    LOS: 26 days    Kurt Cross 09/06/2013

## 2013-09-06 NOTE — Progress Notes (Signed)
Placed pt on auto CPAP with min 5cmH2O and max 20cmH20. Sterile water was added into the humidity chamber and 2 lpm of oxygen was bled in. Pt is tolerating CPAP well at this time with oxygen sats at 98%. RT will continue to monitor as needed.

## 2013-09-07 LAB — GLUCOSE, CAPILLARY
Glucose-Capillary: 146 mg/dL — ABNORMAL HIGH (ref 70–99)
Glucose-Capillary: 135 mg/dL — ABNORMAL HIGH (ref 70–99)
Glucose-Capillary: 129 mg/dL — ABNORMAL HIGH (ref 70–99)
Glucose-Capillary: 110 mg/dL — ABNORMAL HIGH (ref 70–99)
Glucose-Capillary: 36 mg/dL — CL (ref 70–99)
Glucose-Capillary: 77 mg/dL (ref 70–99)
Glucose-Capillary: 127 mg/dL — ABNORMAL HIGH (ref 70–99)

## 2013-09-07 LAB — BASIC METABOLIC PANEL
CO2: 27 mEq/L (ref 19–32)
Calcium: 8.9 mg/dL (ref 8.4–10.5)
Chloride: 100 mEq/L (ref 96–112)
Creatinine, Ser: 0.89 mg/dL (ref 0.50–1.35)
GFR calc Af Amer: >90 mL/min (ref >90)
Sodium: 134 mEq/L — ABNORMAL LOW (ref 135–145)

## 2013-09-07 LAB — CBC
HCT: 32.7 % — ABNORMAL LOW (ref 39.0–52.0)
Hemoglobin: 11.1 g/dL — ABNORMAL LOW (ref 13.0–17.0)
MCH: 31.7 pg (ref 26.0–34.0)
MCHC: 33.9 g/dL (ref 30.0–36.0)
MCV: 93.4 fL (ref 78.0–100.0)
Platelets: 301 K/uL (ref 150–400)
RBC: 3.5 MIL/uL — ABNORMAL LOW (ref 4.22–5.81)
RDW: 14.2 % (ref 11.5–15.5)
WBC: 7.8 K/uL (ref 4.0–10.5)

## 2013-09-07 MED ORDER — FAT EMULSION 20 % IV EMUL
250.0000 mL | INTRAVENOUS | Status: AC
  Administered 2013-09-07: 19:00:00 250 mL via INTRAVENOUS
  Filled 2013-09-07 (×2): qty 250

## 2013-09-07 MED ORDER — TRACE MINERALS CR-CU-F-FE-I-MN-MO-SE-ZN IV SOLN
INTRAVENOUS | Status: DC
  Filled 2013-09-07: qty 3000

## 2013-09-07 MED ORDER — DEXTROSE 50 % IV SOLN
INTRAVENOUS | Status: AC
  Administered 2013-09-07: 50 g
  Filled 2013-09-07: qty 50

## 2013-09-07 MED ORDER — INSULIN GLARGINE 100 UNIT/ML ~~LOC~~ SOLN
55.0000 [IU] | Freq: Every day | SUBCUTANEOUS | Status: DC
  Filled 2013-09-07: qty 0.55

## 2013-09-07 MED ORDER — FAT EMULSION 20 % IV EMUL
250.0000 mL | INTRAVENOUS | Status: DC
  Filled 2013-09-07 (×2): qty 250

## 2013-09-07 MED ORDER — FAT EMULSION 20 % IV EMUL
240.0000 mL | INTRAVENOUS | Status: DC
  Filled 2013-09-07: qty 250

## 2013-09-07 MED ORDER — TRACE MINERALS CR-CU-F-FE-I-MN-MO-SE-ZN IV SOLN
INTRAVENOUS | Status: AC
  Administered 2013-09-07: 19:00:00 via INTRAVENOUS
  Filled 2013-09-07: qty 3000

## 2013-09-07 MED ORDER — HYDRALAZINE HCL 20 MG/ML IJ SOLN
10.0000 mg | Freq: Three times a day (TID) | INTRAMUSCULAR | Status: DC
  Administered 2013-09-07 – 2013-09-09 (×7): 10 mg via INTRAVENOUS
  Filled 2013-09-07 (×6): qty 0.5

## 2013-09-07 MED ORDER — INSULIN GLARGINE 100 UNIT/ML ~~LOC~~ SOLN
35.0000 [IU] | Freq: Every day | SUBCUTANEOUS | Status: DC
  Administered 2013-09-07 – 2013-09-11 (×5): 35 [IU] via SUBCUTANEOUS
  Filled 2013-09-07 (×6): qty 0.35

## 2013-09-07 MED ORDER — DEXTROSE 50 % IV SOLN: 50.0000 mL | Freq: Once | INTRAVENOUS | Status: AC | PRN

## 2013-09-07 MED ORDER — INSULIN ASPART 100 UNIT/ML ~~LOC~~ SOLN
0.0000 [IU] | Freq: Three times a day (TID) | SUBCUTANEOUS | Status: DC
  Administered 2013-09-08: 08:00:00 2 [IU] via SUBCUTANEOUS

## 2013-09-07 NOTE — Progress Notes (Addendum)
Patient ID: Kurt Cross, male   DOB: Feb 08, 1951, 62 y.o.   MRN: [id number] [facility] PROGRESS NOTE  JANLUCAS MUENCHOW [id number] DOB: [birth year] DOA: 08/11/2013 PCP: Kurt Gravel, MD  Brief narrative:  62 y.o. male with a remote history of bowel perforation requiring exploratory laparotomy and temporary colostomy. He then developed a ventral hernia and underwent mesh repair decades ago. He was recently hospitalized and treated for possible pneumonia. He had a fall at home recently and injured his right lower leg leading to readmission. He was noted to be febrile with some abdominal wall swelling. CT scan revealed a large abdominal wall abscess. 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 He was placed on bowel rest and started on TPN. Early in the admission, the patient was fluid overloaded and received intravenous furosemide. He has been since transitioned back to his home dose of oral furosemide and remains clinically stable from CHF standpoint.   Assessment/Plan:  #1 infected prosthetic Gore-Tex mesh with a giant abdominal abscess status post IandD and Gore-Tex mesh removal 08/14/2013 now with enterocut fistula  - Patient is status post I&D Gore-Tex mesh removal, debridement of abdominal wall and peritoneum 08/14/2013.  - Blood cultures with no growth to date  - now with EC fistula-->started on sandostatin by surgery 08/23/13 and dose increased.  - Continue sandostatin, TNA, wound care, supportiver care.  - not a candidate for wound vac  - placement complicated by the fact that pt requires continued wall suction and TNA and currently no SNF available to handle both needs - Dr. [name] will discuss with pt's insurance company to determine if other options are available  #2 sepsis  - secondary to problem #1.  - Wound cultures prelim with GPC in pairs and clusters-->Coag Neg Staph  #3 HTN  - SBP in  180-190, will increase the dose of Hydralazine to 10 mg Q 8 hours  - will continue Coreg, Lasix  - if SBP remains persistently elevated above 150 will need to add additional antihypertensive medication  #4 atrial fibrillation/atrial flutter  - secondary to problem #1. Now ressolved, CE x 3 neg, 2 D ECHO EF 55%  - currently rate controlled, continue Coreg  - CHADSVASc score is 4 (not ASVD plaque on abd aorta), resumed eliquis  #5 chronic kidney disease stage III  - Creatinine is within normal limits this AM  #6 leukocytosis/fever  - secondary to problem #1  - pt remains afebrile over 72 hours and WBC is within normal limits  #7 chronic combined systolic and diastolic CHF  - Initial CXR with volume overload. given 4 doses of lasix 40mg  IV  -08/12/2013 ECHO EF 50-55% (EF improved compared to one year ago (35-40%))  - weight trending down 356 lbs --> 248 --> 333 --> 330 --> 250 --> 342 --> 352 --> 347 lbs this AM  #8 Uncontrolled Pain  - continue analgesia as needed  #9 hyperlipidemia  - Continue statin.  #10 4.5 cm ascending aortic aneurysm per CT scan of 08/01/2013  - Stable. Asymptomatic. Continue risk factor modification with good blood pressure control.  #11 type 2 diabetes  - Hemoglobin A1c 9.5 08/02/2013.Pt currently on Lantus 65 units daily.  - will lower the dose as pt now with hypoglycemic episodes in 30's, Lantus 35 units - we may need to hold Lantus completely if hypoglycemia persist - Sliding scale insulin, will  change from resistant to sensitive coverage #12. Hypomagnesemia  - Repleted  #13 ?? Pauses  - Patient asymptomatic. If HR decreases will decrease coreg. Cardiology notified and ICD has been interrogated.   Family Communication: Updated patient, spoke with wife at length over the phone  Disposition Plan: To be determined, - placement complicated by the fact that pt requires continued wall suction and TNA and currently no SNF available to handle both needs - Dr.  [name] will discuss with pt's insurance company to determine if other options are available   Antibiotics  Vancomycin 08/15/13>>> 08/31/13  Zosyn 08/14/13>>>08/31/13  Procedures/Studies:  Ct Abdomen Pelvis Wo Contrast 08/14/2013 14.5 x 18.1 x 26.1 cm fluid collection/abscess within the anterior abdominal wall, as described above, incompletely visualized. The collection appears just anterior to prior ventral hernia mesh. Given the size and proximity to the skin surface, incision and drainage is suggested.  Dg Chest 2 View 08/01/2013 No active cardiopulmonary disease.  Dg Hip Complete Right 08/11/2013 No acute osseous injury of the right hip.  Ct Angio Chest Pe W/cm &/or Wo Cm 08/01/2013 4.5 cm AAA without complicating features. No evidence of acute pulmonary embolism or aortic dissection.  Dg Chest Port 1 View 08/18/2013 Right upper extremity PICC terminates in the mid superior vena cava. Stable congestive heart failure pattern.  Dg Chest Port 1 View 08/17/2013 Stable chest with changes of congestive heart failure with pulmonary interstitial edema. Cardiac pacer in stable position.  Dg Chest Portable 1 View 08/15/2013 Stable cardiomegaly with interval worsening of pulmonary vascular congestion/ edema relative to 08/13/2013.  Dg Chest Port 1 View 08/13/2013 No acute findings. Stable cardiomegaly and pulmonary venous prominence without overt edema.  2-D echo 08/12/2013  Incision and drainage of abscess, Gore-Tex mesh removal, debridement of abdominal wall and peritoneum Consultants:  Cardiology: Dr. Terrence Dupont 08/13/2013  General surgery: Dr. Johney Maine 08/14/2013  ID: Dr Megan Salon 08/15/13  HPI/Subjective: No events overnight.   Objective: Filed Vitals:   09/06/13 2040 09/06/13 2300 09/07/13 0402 09/07/13 0923  BP: 158/87  154/55 193/93  Pulse: 63  79 98  Temp: 98 F (36.7 C)  98.3 F (36.8 C) 98 F (36.7 C)  TempSrc: Oral  Oral Oral  Resp: 20 20 20 28   Height:      Weight:   151.5 kg (334 lb)    SpO2: 96% 98% 96% 100%    Intake/Output Summary (Last 24 hours) at 09/07/13 1032 Last data filed at 09/07/13 0600  Gross per 24 hour  Intake 3650.07 ml  Output   1520 ml  Net 2130.07 ml    Exam:   General:  Pt is alert, follows commands appropriately, not in acute distress  Cardiovascular: Regular rate and rhythm, S1/S2, no murmurs, no rubs, no gallops  Respiratory: Clear to auscultation bilaterally, no wheezing, no crackles, no rhonchi  Abdomen: Soft, non tender, non distended, obese, open abdominal wound with drainage  Extremities: No edema, pulses DP and PT palpable bilaterally  Neuro: Grossly nonfocal  Data Reviewed: Basic Metabolic Panel:  Recent Labs Lab 09/01/13 0515 09/02/13 0445 09/04/13 0502 09/06/13 0400 09/07/13 0930  NA 139 135 137 136 134*  K 3.6 3.9 3.8 3.7 4.1  CL 101 99 100 101 100  CO2 29 28 27 25 27   GLUCOSE 56* 101* 127* 89 130*  BUN 32* 25* 23 23 24*  CREATININE 1.35 1.18 1.08 1.03 0.89  CALCIUM 8.8 9.1 9.2 8.8 8.9  MG  --  1.9  --  1.7  --  PHOS  --  3.2  --  4.4  --    Liver Function Tests:  Recent Labs Lab 09/02/13 0445 09/06/13 0400  AST 21 49*  ALT 12 41  ALKPHOS 48 70  BILITOT 0.8 1.2  PROT 7.3 7.2  ALBUMIN 2.1* 2.1*   CBC:  Recent Labs Lab 09/03/13 0600 09/04/13 0502 09/05/13 0505 09/06/13 0400 09/07/13 0419  WBC 8.1 8.1 7.9 8.1 7.8  NEUTROABS  --   --   --  3.9  --   HGB 11.6* 11.6* 11.6* 11.1* 11.1*  HCT 34.2* 33.9* 34.6* 32.6* 32.7*  MCV 94.0 93.4 93.5 92.9 93.4  PLT 270 279 294 293 301   CBG:  Recent Labs Lab 09/06/13 1700 09/06/13 2023 09/06/13 2335 09/07/13 0357 09/07/13 0727  GLUCAP 142* 174* 158* 146* 135*    Scheduled Meds: . acetaminophen  1,000 mg Oral TID  . antiseptic oral rinse  15 mL Mouth Rinse q12n4p  . apixaban  5 mg Oral BID  . atorvastatin  40 mg Oral QODAY  . carvedilol  3.125 mg Oral BID WC  . ferrous sulfate  325 mg Oral BID  . furosemide  40 mg Oral Daily  .  hydrALAZINE  10 mg Intravenous Q8H  . insulin aspart  0-20 Units Subcutaneous Q4H  . insulin glargine  65 Units Subcutaneous QHS  . lip balm  1 application Topical BID  . loperamide  2 mg Oral BID  . octreotide  150 mcg Subcutaneous TID  . OxyCODONE  15 mg Oral Q12H  . potassium chloride  40 mEq Oral Daily  . saccharomyces boulardii  250 mg Oral BID  . sodium chloride  3 mL Intravenous Q12H  . zolpidem  10 mg Oral QHS   Continuous Infusions: . sodium chloride 20 mL/hr at 09/06/13 2300  . [name] KitchenTPN (CLINIMIX-E) Adult 181 mL/hr at 09/06/13 1900   And  . fat emulsion 250 mL (09/06/13 1859)   Faye Ramsay, MD  [facility] Pager [phone]  If 7PM-7AM, please contact night-coverage [email or web address] Password [email or web address] 09/07/2013, 10:32 AM   LOS: 27 days

## 2013-09-07 NOTE — Progress Notes (Signed)
Patient remains on wall suction for wound care and tpn. CSW still does not have any snfs that are able to ahndle both needs.  Jerri Hargadon C. Byron MSW, [place]

## 2013-09-07 NOTE — Progress Notes (Signed)
Placed pt on auto CPAP with min 5cmH2O and max 20cmH20. Sterile water was added into the humidity chamber and 2 lpm of oxygen was bled in. Pt is tolerating CPAP well at this time. RN notified. RT will continue to monitor as needed.

## 2013-09-07 NOTE — Progress Notes (Signed)
Hypoglycemic Event  CBG: 36  Treatment: D50 IV 50 mL  Symptoms: Hungry  Follow-up CBG: Time:1600 CBG Result:110  Possible Reasons for Event: tpn changed to cyclic  Comments/MD notified:myers,also Pharamcy  Called     Arlie Solomons A  Remember to initiate Hypoglycemia Order Set & complete

## 2013-09-07 NOTE — Progress Notes (Signed)
PARENTERAL NUTRITION CONSULT -  BRIEF FOLLOW UP NOTE Pharmacy Consult for TNA  Indication: Enterocutaneous fistula  RN reported low CBGs off of cyclic TNA this evening.  Patient had a CBG of 36 and improved to 110 following 1 amp of D50.  Plan: Due to instability of CBGs off of TNA nutrition, will resume continuous TNA since this is also what patient and family prefers.  Will also reduce Lantus dose tonight to 55 units and f/u CBGs for further correction tomorrow if needed.  Hershal Coria, PharmD, BCPS Pager: [phone] 09/07/2013 5:46 PM

## 2013-09-07 NOTE — Progress Notes (Signed)
OT Cancellation Note  Patient Details Name: Kurt Cross MRN: [id number] DOB: 1951/09/19   Cancelled Treatment:    Reason Eval/Treat Not Completed: Patient declined, no reason specified (pt outright refused due to fatigue/just worked with PT)  Airi Copado A 09/07/2013, 3:32 PM

## 2013-09-07 NOTE — Progress Notes (Signed)
24 Days Post-Op  Subjective: He is fairly  Discouraged today.  They want him to go to an [facility] in [place], and he is also discouraged that the [facility] idea will not work.  He's not seeing allot of encouragement with the length of time this is going to require.   Objective: Vital signs in last 24 hours: Temp:  [98 F (36.7 C)-98.6 F (37 C)] 98 F (36.7 C) (12/02 0923) Pulse Rate:  [63-98] 98 (12/02 0923) Resp:  [20-28] 28 (12/02 0923) BP: (154-193)/(55-93) 193/93 mmHg (12/02 0923) SpO2:  [96 %-100 %] 100 % (12/02 0923) Weight:  [151.5 kg (334 lb)] 151.5 kg (334 lb) (12/02 0402) Last BM Date: 09/01/13 Afebrile, VSS, Labs OK 470 recorded from fistula Intake/Output from previous day: 12/01 0701 - 12/02 0700 In: 4320.1 [P.O.:80; I.V.:340; TPN:3900.1] Out: 1520 [Urine:1050; Drains:470] Intake/Output this shift:    General appearance: alert, cooperative and no distress GI: soft, non-tender; bowel sounds normal; no masses,  no organomegaly and the effluent coming from the fistula(s) is thick yellowish brown in color.    Lab Results:   Recent Labs  09/06/13 0400 09/07/13 0419  WBC 8.1 7.8  HGB 11.1* 11.1*  HCT 32.6* 32.7*  PLT 293 301    BMET  Recent Labs  09/06/13 0400 09/07/13 0930  NA 136 134*  K 3.7 4.1  CL 101 100  CO2 25 27  GLUCOSE 89 130*  BUN 23 24*  CREATININE 1.03 0.89  CALCIUM 8.8 8.9   PT/INR No results found for this basename: LABPROT, INR,  in the last 72 hours   Recent Labs Lab 09/02/13 0445 09/06/13 0400  AST 21 49*  ALT 12 41  ALKPHOS 48 70  BILITOT 0.8 1.2  PROT 7.3 7.2  ALBUMIN 2.1* 2.1*     Lipase  No results found for this basename: lipase     Studies/Results: No results found.  Medications: . acetaminophen  1,000 mg Oral TID  . antiseptic oral rinse  15 mL Mouth Rinse q12n4p  . apixaban  5 mg Oral BID  . atorvastatin  40 mg Oral QODAY  . carvedilol  3.125 mg Oral BID WC  . ferrous sulfate  325 mg Oral BID  .  furosemide  40 mg Oral Daily  . hydrALAZINE  5 mg Intravenous Q8H  . insulin aspart  0-20 Units Subcutaneous Q4H  . insulin glargine  65 Units Subcutaneous QHS  . lip balm  1 application Topical BID  . loperamide  2 mg Oral BID  . octreotide  150 mcg Subcutaneous TID  . OxyCODONE  15 mg Oral Q12H  . potassium chloride  40 mEq Oral Daily  . saccharomyces boulardii  250 mg Oral BID  . sodium chloride  3 mL Intravenous Q12H  . zolpidem  10 mg Oral QHS    Assessment/Plan Infected prosthetic mesh of abdominal wall with GIANT abscess s/p removal 08/14/2013 Dr. 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  Diabetes mellitus, insulin dependent (IDDM), uncontrolled  HYPERLIPIDEMIA  Obesity, Class III, BMI 40-49.9 (morbid obesity)  OBSTRUCTIVE SLEEP APNEA  Acute renal insufficiency from fistula/wound loss-improved    Plan:  Ongoing conservative treatment, TNA.      LOS: 27 days    Kurt Cross 09/07/2013

## 2013-09-07 NOTE — Progress Notes (Signed)
Physical Therapy Treatment Patient Details Name: LIZBETH WODRICH MRN: [id number] DOB: [birth year] Today's Date: 09/07/2013 Time: [id number] PT Time Calculation (min): 25 min  PT Assessment / Plan / Recommendation  History of Present Illness FITZ ROBBERSON is a 62 y.o. male  with known history of chronic systolic heart failure status post defibrillator placement last EF measured was in 2013 was 35-40%, on home oxygen, OSA noncompliant with CPAP, diabetes mellitus, hypertension and hyperlipidemia presented to the ER because right leg pain after sustaining a fall the night prior to admission.  08/14/13: s/p abdominal abscess removal   PT Comments   Assisted pt OOB to amb, required increased time.  Follow Up Recommendations  SNF     Does the patient have the potential to tolerate intense rehabilitation     Barriers to Discharge        Equipment Recommendations       Recommendations for Other Services    Frequency Min 3X/week   Progress towards PT Goals Progress towards PT goals: Progressing toward goals  Plan      Precautions / Restrictions Precautions Precautions: Fall Precaution Comments: ostomy bag on R side and wall suction to ABD Restrictions Weight Bearing Restrictions: No    Pertinent Vitals/Pain No c/o pain    Mobility  Bed Mobility Bed Mobility: Supine to Sit Supine to Sit: 5: Supervision Details for Bed Mobility Assistance: supervision for safety with lines Transfers Transfers: Sit to Stand;Stand to Sit Sit to Stand: 5: Supervision;From bed Stand to Sit: 5: Supervision;To chair/3-in-1 Details for Transfer Assistance: supervision for safety lines/tubes.  Pt tends to "plop" uncontrolled, VC's for safety  Ambulation/Gait Ambulation/Gait Assistance: 4: Min guard;5: Supervision Ambulation Distance (Feet): 135 Feet Assistive device: Rolling walker Ambulation/Gait Assistance Details: quick gait Gait Pattern: Step-through pattern;Wide base of support;Decreased stride  length;Ataxic General Gait Details: sats 96% after amb on RA; HR 92     PT Goals (current goals can now be found in the care plan section)    Visit Information  Last PT Received On: 09/07/13 Assistance Needed: +1 History of Present Illness: JOHNNYE SENTZ is a 62 y.o. male  with known history of chronic systolic heart failure status post defibrillator placement last EF measured was in 2013 was 35-40%, on home oxygen, OSA noncompliant with CPAP, diabetes mellitus, hypertension and hyperlipidemia presented to the ER because right leg pain after sustaining a fall the night prior to admission.  08/14/13: s/p abdominal abscess removal    Subjective Data      Cognition       Balance     End of Session PT - End of Session Equipment Utilized During Treatment: Gait belt Activity Tolerance: Patient tolerated treatment well Patient left: in chair;with call bell/phone within reach;with family/visitor present   Rica Koyanagi  PTA [facility]  Acute  Rehab Pager      [phone]

## 2013-09-07 NOTE — Progress Notes (Signed)
NUTRITION FOLLOW UP  Intervention:   Continue TPN per Pharmacy Diet advancement per MD discretion RD to continue monitor  Nutrition Dx:   Inadequate oral intake related to EC fistula as evidenced by NPO status; ongoing  Goal:   Pt to meet >/= 90% of their estimated nutrition needs; being met  Monitor:   TPN rate; clinamix E 123456 cyclic 18 hours/day Weight; 22 lbs weight loss since admission, 1 lb weight gain in the past week Labs; Blood glucose ranging 36-174 mg/dL, low sodium, potassium now WNL, low albumin, low GFR, phosphorus WNL; Triglycerides still WNL (12/1), low hemoglobin, low prealbumin trending up Diet advancement; none  Assessment:   62 y.o. male with known history of chronic systolic heart failure status post defibrillator placement last EF measured was in 2013 was 35-40%, on home oxygen, OSA noncompliant with CPAP, diabetes mellitus, hypertension and hyperlipidemia. CT scan revealed a large abdominal wall abscess.  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 TNA started 11/12 at 10pm.  TPN was changed to cyclic [id number]. Per Pharmacy note, cyclic 18 hour infusion of Clinimix E 5/15, starting at 97ml/hr x 1 hour, then 181 ml/hr x 16 hours, then 50 ml/hr x 1 hr, then off; with lipids 20% at 13 ml/hr x 18 hours. Per chart pt will likely be discharged with TNA.  Weight stable over past week. Per chart pt had a hypoglycemic event today. RD will continue to monitor weight trends and re-estimate energy needs as needed.   Height: Ht Readings from Last 1 Encounters:  08/14/13 6' (1.829 m)    Weight Status:   Wt Readings from Last 1 Encounters:  09/07/13 334 lb (151.5 kg)    Re-estimated needs:  Kcal: 2370-2650 Protein: 160-180 grams Fluid: 3.3-3.5 L/day  Skin: +1 Generalized edema, non-pitting RLE and LLE edema; abdominal incisions/wounds with closed system drains  Diet Order: NPO   Intake/Output Summary (Last 24 hours) at 09/07/13 1657 Last data filed at 09/07/13  1400  Gross per 24 hour  Intake 4966.07 ml  Output   1970 ml  Net 2996.07 ml    Last BM: 11/26   Labs:   Recent Labs Lab 09/02/13 0445 09/04/13 0502 09/06/13 0400 09/07/13 0930  NA 135 137 136 134*  K 3.9 3.8 3.7 4.1  CL 99 100 101 100  CO2 28 27 25 27   BUN 25* 23 23 24*  CREATININE 1.18 1.08 1.03 0.89  CALCIUM 9.1 9.2 8.8 8.9  MG 1.9  --  1.7  --   PHOS 3.2  --  4.4  --   GLUCOSE 101* 127* 89 130*    CBG (last 3)   Recent Labs  09/07/13 1118 09/07/13 1541 09/07/13 1600  GLUCAP 129* 36* 110*    Scheduled Meds: . acetaminophen  1,000 mg Oral TID  . antiseptic oral rinse  15 mL Mouth Rinse q12n4p  . apixaban  5 mg Oral BID  . atorvastatin  40 mg Oral QODAY  . carvedilol  3.125 mg Oral BID WC  . dextrose      . ferrous sulfate  325 mg Oral BID  . furosemide  40 mg Oral Daily  . hydrALAZINE  10 mg Intravenous Q8H  . insulin aspart  0-20 Units Subcutaneous Q4H  . insulin glargine  65 Units Subcutaneous QHS  . lip balm  1 application Topical BID  . loperamide  2 mg Oral BID  . octreotide  150 mcg Subcutaneous TID  . OxyCODONE  15 mg Oral Q12H  .  potassium chloride  40 mEq Oral Daily  . saccharomyces boulardii  250 mg Oral BID  . sodium chloride  3 mL Intravenous Q12H  . zolpidem  10 mg Oral QHS    Continuous Infusions: . sodium chloride 20 mL/hr at 09/06/13 2300  . [name] KitchenTPN (CLINIMIX-E) Adult 181 mL/hr at 09/06/13 1900   And  . fat emulsion 250 mL (09/06/13 1859)  . [name] KitchenTPN (CLINIMIX-E) Adult     And  . fat emulsion      Pryor Ochoa RD, LDN Inpatient Clinical Dietitian Pager: [phone] After Hours Pager: [phone]

## 2013-09-07 NOTE — Progress Notes (Signed)
PARENTERAL NUTRITION CONSULT NOTE - FOLLOW UP  Pharmacy Consult for TNA Indication: Enterocutaneous fistula  No Known Allergies  Patient Measurements: Height: 6' (182.9 cm) Weight: 334 lb (151.5 kg) IBW/kg (Calculated) : 77.6 Adjusted Body Weight: 102.6kg  Vital Signs: Temp: 98.3 F (36.8 C) (12/02 0402) Temp src: Oral (12/02 0402) BP: 154/55 mmHg (12/02 0402) Pulse Rate: 79 (12/02 0402) Intake/Output from previous day: 12/01 0701 - 12/02 0700 In: 4320.1 [P.O.:80; I.V.:340; TPN:3900.1] Out: 1520 [Urine:1050; Drains:470]  Labs:  Recent Labs  09/05/13 0505 09/06/13 0400 09/07/13 0419  WBC 7.9 8.1 7.8  HGB 11.6* 11.1* 11.1*  HCT 34.6* 32.6* 32.7*  PLT 294 293 301    Recent Labs  09/06/13 0400  NA 136  K 3.7  CL 101  CO2 25  GLUCOSE 89  BUN 23  CREATININE 1.03  CALCIUM 8.8  MG 1.7  PHOS 4.4  PROT 7.2  ALBUMIN 2.1*  AST 49*  ALT 41  ALKPHOS 70  BILITOT 1.2  PREALBUMIN 13.6*  TRIG 92   Estimated Creatinine Clearance: 115.6 ml/min (by C-G formula based on Cr of 1.03).    Recent Labs  09/06/13 2335 09/07/13 0357 09/07/13 0727  GLUCAP 158* 146* 135*    Medications:  Infusions:  . sodium chloride 20 mL/hr at 09/06/13 2300  . [name] KitchenTPN (CLINIMIX-E) Adult 181 mL/hr at 09/06/13 1900   And  . fat emulsion 250 mL (09/06/13 1859)    Insulin Requirements in the past 24 hours:   Lantus 65units sq qHS   17 units SSI required  - CBG q4h with resistant scale coverage  50 units regular insulin added to TNA   Nutritional Goals:   Per RD 11/13:  2400-2700 kcal/day, 160-180 grams/day protein, 3.3-3.5 L/day fluid  Clinimix E5/15 at goal rate 125 ml/hr (3L/day) with 20% lipids at 10 ml/hr to provide 150g protein and 2675 kcal daily.  Due to how much fluid can be placed in TNA bag, Max volume is 3L.   Current Nutrition:   NPO  Clinimix 123456 cyclic over 18 hours and 20% lipids at 73ml/hr   IVF: NS at [facility]  Assessment: 62 yo male with large abdominal  abscess with infected old Gore Tex mesh s/p I&D abscess, removal of mesh, debridement of abdominal wall and peritoneum. 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 TNA started 11/12 at 10pm.  Pt will likely be discharged eventually with TNA.  Switched to cyclic 18 hours TNA on Q000111Q.    [id number]: 1st day on cyclic TNA. Low threshold for switching back to 24 hr TPN if pt does not tolerate d/t CHF.  Morbid obesity: pt only up and walking for P/T so no advantage in this case to be off TNA for 6 hours if pt is not leaving bed. Pt stated he felt fine with cyclic TNA and did not notice any difference.  No issues overnight per RN.   No issues notes with labs.    Labs from 12/2  Glucose: Hx DM on insulin PTA, CBG improved with addition of insulin to TNA 11/16 and increased Lantus doses.  CBGs slightly elevated overnight and increased SSI needs with cyclic TNA.    Electrolytes:  Na 134, other lytes ok.   SCr ok, on po lasix 40mg  and KCl 40 mEq daily. UOP 0.3 ml/kg/hr.   LFTs: small bump in AST, albumin only 2.1  TGs: wnl, 124 (11/13), 89 (11/17), 99 (11/24), 92 (12/1)  Prealbumin: low, 10.3 (11/13), 11.1 (11/17), 11.2(11/24), 12/1 - pending  Monitor fluid status very closely since TNA at high goal rate.   Octreotide started 11/17 to control fistula output.  TPN Access: Triple lumen PICC placed 11/12 TPN day#: 22  Plan:   Continue cyclic 18 hour infusion of Clinimix E 5/15 with lipids 20% at 13 ml/hr.     Continue insulin 50 units per 3L bag (16.7 units/L) and Lantus 65 units qhs  CBG checks q 4 hours.  F/u closely and adjust insulin as necessary after monitoring CBGs.    KCl 69mEq PO daily per MD  TNA to contain standard multivitamins and trace elements.  Continue NS at [facility]  TNA lab panels on Mondays & Thursdays  Monitor fluid status very closely.    Ralene Bathe, PharmD, BCPS 09/07/2013, 8:10 AM  Pager: [phone]

## 2013-09-08 DIAGNOSIS — G4733 Obstructive sleep apnea (adult) (pediatric): Secondary | ICD-10-CM

## 2013-09-08 LAB — GLUCOSE, CAPILLARY
Glucose-Capillary: 155 mg/dL — ABNORMAL HIGH (ref 70–99)
Glucose-Capillary: 172 mg/dL — ABNORMAL HIGH (ref 70–99)
Glucose-Capillary: 113 mg/dL — ABNORMAL HIGH (ref 70–99)

## 2013-09-08 LAB — CBC
HCT: 34.2 % — ABNORMAL LOW (ref 39.0–52.0)
Hemoglobin: 11.7 g/dL — ABNORMAL LOW (ref 13.0–17.0)
MCH: 31.8 pg (ref 26.0–34.0)
MCHC: 34.2 g/dL (ref 30.0–36.0)
RDW: 14.2 % (ref 11.5–15.5)

## 2013-09-08 MED ORDER — TRACE MINERALS CR-CU-F-FE-I-MN-MO-SE-ZN IV SOLN
INTRAVENOUS | Status: AC
  Administered 2013-09-08: 18:00:00 via INTRAVENOUS
  Filled 2013-09-08: qty 3000

## 2013-09-08 MED ORDER — FAT EMULSION 20 % IV EMUL
250.0000 mL | INTRAVENOUS | Status: AC
  Administered 2013-09-08: 250 mL via INTRAVENOUS
  Filled 2013-09-08: qty 250

## 2013-09-08 NOTE — Progress Notes (Signed)
PARENTERAL NUTRITION CONSULT NOTE - FOLLOW UP  Pharmacy Consult for TNA Indication: Enterocutaneous fistula  No Known Allergies  Patient Measurements: Height: 6' (182.9 cm) Weight: 331 lb 5.6 oz (150.3 kg) IBW/kg (Calculated) : 77.6 Adjusted Body Weight: 102.6kg  Vital Signs: Temp: 98.7 F (37.1 C) (12/03 0405) Temp src: Oral (12/03 0405) BP: 157/69 mmHg (12/03 0405) Pulse Rate: 90 (12/03 0405) Intake/Output from previous day: 12/02 0701 - 12/03 0700 In: 3122.7 [I.V.:458.7; [id number] Out: 2560 [Urine:1960; Drains:600]  Labs:  Recent Labs  09/06/13 0400 09/07/13 0419 09/08/13 0425  WBC 8.1 7.8 8.7  HGB 11.1* 11.1* 11.7*  HCT 32.6* 32.7* 34.2*  PLT 293 301 313    Recent Labs  09/06/13 0400 09/07/13 0930  NA 136 134*  K 3.7 4.1  CL 101 100  CO2 25 27  GLUCOSE 89 130*  BUN 23 24*  CREATININE 1.03 0.89  CALCIUM 8.8 8.9  MG 1.7  --   PHOS 4.4  --   PROT 7.2  --   ALBUMIN 2.1*  --   AST 49*  --   ALT 41  --   ALKPHOS 70  --   BILITOT 1.2  --   PREALBUMIN 13.6*  --   TRIG 92  --    Estimated Creatinine Clearance: 133.2 ml/min (by C-G formula based on Cr of 0.89).    Recent Labs  09/07/13 1948 09/07/13 2343 09/08/13 0336  GLUCAP 127* 154* 155*    Medications:  Infusions:  . sodium chloride 20 mL/hr at 09/07/13 2340  . [name] KitchenTPN (CLINIMIX-E) Adult 125 mL/hr at 09/07/13 1832   And  . fat emulsion 250 mL (09/07/13 1832)    Insulin Requirements in the past 24 hours:   Lantus decreased to 35units sq qHS (previously 65 units last given on 12/1)   9 units SSI required  - CBG q4h with sensitive scale coverage  50 units regular insulin added to TNA   Nutritional Goals:   Re-estimated per RD 12/2:  [id number] kcal/day, 160-180 grams/day protein, 3.3-3.5 L/day fluid  Clinimix E5/15 at goal rate 125 ml/hr (3L/day) with 20% lipids at 10 ml/hr to provide 150g protein and 2675 kcal daily.  Due to how much fluid can be placed in TNA bag, Max volume is 3L.    Current Nutrition:   NPO  Clinimix 5/15 at 164ml/hr and 20% lipids at 10 ml/hr over 24 hours  IVF: NS at [facility]: 61 yo male with large abdominal abscess with infected old Gore Tex mesh s/p I&D abscess, removal of mesh, debridement of abdominal wall and peritoneum. 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 TNA started 11/12 at 10pm.  Pt will likely be discharged eventually with TNA.  12/3: Pt had hypoglycemic events yesterday after starting cyclic regimen and TNA changed back to 24 hour cycle for now.  Low CBGs not noted until pt had been off TNA for ~ 4 hours.  Also concern for fluid load d/t CHF.  Pt w/ morbid obesity and only up and walking for P/T so no advantage in this case to be off TNA for 6 hours if pt is not leaving bed.  No issues overnight per RN.   No issues notes with labs.  Drain output increased to 600 mL / 24 hrs on 12/2.  UOP improved, no reported problems with high fluid rate.  Prealbumin is improving.  Labs  Glucose: Hx DM on insulin PTA.  CBGs previously improved with addition of insulin to TNA 11/16 and  increased Lantus doses.  CBGs ranged 36-172 yesterday.  Low CBG while TNA off and high CBG this morning after ~ 1/2 lantus dose given last night.  Expect CBGs may increase with lower Lantus dosing - follow closely.  Electrolytes, from 12/2:  Na 134, other lytes ok.   SCr ok, on po lasix 40mg  and KCl 40 mEq daily. UOP 0.54 ml/kg/hr.   LFTs: small bump in AST, albumin only 2.1  TGs: wnl, 124 (11/13), 89 (11/17), 99 (11/24), 92 (12/1)  Prealbumin: low, 10.3 (11/13), 11.1 (11/17), 11.2(11/24), 13.6 (12/1)  Monitor fluid status very closely since TNA at high goal rate.   Octreotide started 11/17 to control fistula output.  TPN Access: Triple lumen PICC placed 11/12 TPN day#: 23  Plan:   Continue Clinimix E 5/15 at 125 ml/hr with lipids 20% at 10 ml/hr over 24 hours.     Continue insulin 50 units per 3L bag ( 16.7 units/L)  TNA to contain  standard multivitamins and trace elements.  Continue CBG checks q 4 hours with sensitive SSI coverage.  F/U closely and adjust insulin coverage as needed.  Lantus 35 units QHS per MD  KCl 17mEq PO daily per MD  Continue NS at [facility]  TNA lab panels on Mondays & Thursdays  Monitor fluid status very closely.     Gretta Arab PharmD, BCPS Pager [phone] 09/08/2013 12:12 PM

## 2013-09-08 NOTE — Progress Notes (Signed)
[facility] PROGRESS NOTE  Kurt Cross [id number] DOB: 09/13/1951 DOA: 08/11/2013 PCP: [name], MD  Assessment/Plan  Infected prosthetic gore-tex mesh with large abscess s/p I&D and removal of gore-tex on 11/8.  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 -  Blood cultures NGF -  Continue sandostatin per surgery -  Continue TNA for the foreseeable future -  Needs continuous wall suction for the foreseeable future, which cannot be provided at SNF -  S/p 2 week course of vancomycin and zosyn  Sepsis due to coag neg staph s/p 2 weeks of vancomycin  #3 HTN blood pressure trending to normal limits - Continue Hydralazine 10 mg Q 8 hours with coreg and lasix   #4 atrial fibrillation/atrial flutter rate controlled on telemetry - secondary to problem #1. Now ressolved, CE x 3 neg, 2 D ECHO EF 55%  - currently rate controlled, continue Coreg  - CHADSVASc score is 4 (not ASVD plaque on abd aorta), resumed eliquis   #5 chronic kidney disease stage III, stable.   -  Minimize nephrotoxins -  Renally dose medicationa  #6 leukocytosis/fever resolved.    #7 chronic combined systolic and diastolic CHF  - Initial CXR with volume overload. given 4 doses of lasix 40mg  IV  -08/12/2013 ECHO EF 50-55% (EF improved compared to one year ago (35-40%))  - weight trending down 356 lbs --> 348 --> 333 --> 330 --> 250 --> 342 --> 352 --> 347 __> 331 lbs  OSA, stable.  Continue CPAP.  #8 Uncontrolled Pain  - continue prn oxycodone as needed   #9 hyperlipidemia  - Continue statin.   #10 4.5 cm ascending aortic aneurysm per CT scan of 08/01/2013  - Stable. Asymptomatic. Continue risk factor modification with good blood pressure control.   #11 type 2 diabetes hypoglycemic when trying to cycle TPN 12/2 but remained stable overnight -  Continue lantus 35 units with SSI low dose  #12. Hypomagnesemia  -  Repeat level tomorrow  Diet:  NPO with  ice chips Access:  TL PICC IVF:  TPN Proph:  apixaban  Code Status: full Family Communication: patient alone Disposition Plan: Patient is not a good candidate for SNF due to complexity of care, including need for continuous TPN and continuous wall suction.  These interventions can easily be managed by an LTAC facility which is the preferable option for this patient's ongoing care.  The patient is otherwise stable and no longer meets criteria for hospitalization.     Antibiotics  Vancomycin 08/15/13>>> 08/31/13  Zosyn 08/14/13>>>08/31/13  Procedures/Studies:  Ct Abdomen Pelvis Wo Contrast 08/14/2013 14.5 x 18.1 x 26.1 cm fluid collection/abscess within the anterior abdominal wall, as described above, incompletely visualized. The collection appears just anterior to prior ventral hernia mesh. Given the size and proximity to the skin surface, incision and drainage is suggested.  Dg Chest 2 View 08/01/2013 No active cardiopulmonary disease.  Dg Hip Complete Right 08/11/2013 No acute osseous injury of the right hip.  Ct Angio Chest Pe W/cm &/or Wo Cm 08/01/2013 4.5 cm AAA without complicating features. No evidence of acute pulmonary embolism or aortic dissection.  Dg Chest Port 1 View 08/18/2013 Right upper extremity PICC terminates in the mid superior vena cava. Stable congestive heart failure pattern.  Dg Chest Port 1 View 08/17/2013 Stable chest with changes of congestive heart failure with pulmonary interstitial edema. Cardiac pacer in stable position.  Dg Chest Portable 1 View 08/15/2013 Stable cardiomegaly with interval worsening of pulmonary  vascular congestion/ edema relative to 08/13/2013.  Dg Chest Port 1 View 08/13/2013 No acute findings. Stable cardiomegaly and pulmonary venous prominence without overt edema.  2-D echo 08/12/2013  Incision and drainage of abscess, Gore-Tex mesh removal, debridement of abdominal wall and peritoneum Consultants:  Cardiology: Dr. Terrence Dupont 08/13/2013  General  surgery: Dr. Johney Maine 08/14/2013  ID: Dr Megan Salon 08/15/13  HPI/Subjective:  No acute events.  Objective: Filed Vitals:   09/08/13 0800 09/08/13 1455 09/08/13 1602 09/08/13 1646  BP: 151/66 118/69 125/66 130/62  Pulse: 82 68  64  Temp: 98.6 F (37 C) 98.4 F (36.9 C)    TempSrc: Oral Oral    Resp: 24 20    Height:      Weight:      SpO2: 96% 97%      Intake/Output Summary (Last 24 hours) at 09/08/13 1724 Last data filed at 09/08/13 1500  Gross per 24 hour  Intake 3367.01 ml  Output   2610 ml  Net 757.01 ml   Filed Weights   09/06/13 0600 09/07/13 0402 09/08/13 0405  Weight: 157.8 kg (347 lb 14.2 oz) 151.5 kg (334 lb) 150.3 kg (331 lb 5.6 oz)    Exam:   General:  [name], No acute distress  HEENT:  NCAT, MMM  Cardiovascular:  RRR, nl S1, S2 no mrg, 2+ pulses, warm extremities  Respiratory:  CTAB, no increased WOB  Abdomen:   NABS, soft, NT/ND, clear ostomy dressing in place with bilious material right abdomen  MSK:   Normal tone and bulk, no LEE  Neuro:  Grossly intact  Data Reviewed: Basic Metabolic Panel:  Recent Labs Lab 09/02/13 0445 09/04/13 0502 09/06/13 0400 09/07/13 0930  NA 135 137 136 134*  K 3.9 3.8 3.7 4.1  CL 99 100 101 100  CO2 28 27 25 27   GLUCOSE 101* 127* 89 130*  BUN 25* 23 23 24*  CREATININE 1.18 1.08 1.03 0.89  CALCIUM 9.1 9.2 8.8 8.9  MG 1.9  --  1.7  --   PHOS 3.2  --  4.4  --    Liver Function Tests:  Recent Labs Lab 09/02/13 0445 09/06/13 0400  AST 21 49*  ALT 12 41  ALKPHOS 48 70  BILITOT 0.8 1.2  PROT 7.3 7.2  ALBUMIN 2.1* 2.1*   No results found for this basename: LIPASE, AMYLASE,  in the last 168 hours No results found for this basename: AMMONIA,  in the last 168 hours CBC:  Recent Labs Lab 09/04/13 0502 09/05/13 0505 09/06/13 0400 09/07/13 0419 09/08/13 0425  WBC 8.1 7.9 8.1 7.8 8.7  NEUTROABS  --   --  3.9  --   --   HGB 11.6* 11.6* 11.1* 11.1* 11.7*  HCT 33.9* 34.6* 32.6* 32.7* 34.2*  MCV  93.4 93.5 92.9 93.4 92.9  PLT 279 294 293 301 313   Cardiac Enzymes: No results found for this basename: CKTOTAL, CKMB, CKMBINDEX, TROPONINI,  in the last 168 hours BNP (last 3 results)  Recent Labs  08/01/13 1649  PROBNP 481.4*   CBG:  Recent Labs Lab 09/07/13 2343 09/08/13 0336 09/08/13 0724 09/08/13 1143 09/08/13 1637  GLUCAP 154* 155* 172* 117* 113*    No results found for this or any previous visit (from the past 240 hour(s)).   Studies: No results found.  Scheduled Meds: . acetaminophen  1,000 mg Oral TID  . antiseptic oral rinse  15 mL Mouth Rinse q12n4p  . apixaban  5 mg Oral BID  .  atorvastatin  40 mg Oral QODAY  . carvedilol  3.125 mg Oral BID WC  . ferrous sulfate  325 mg Oral BID  . furosemide  40 mg Oral Daily  . hydrALAZINE  10 mg Intravenous Q8H  . insulin aspart  0-9 Units Subcutaneous TID WC  . insulin glargine  35 Units Subcutaneous QHS  . lip balm  1 application Topical BID  . loperamide  2 mg Oral BID  . octreotide  150 mcg Subcutaneous TID  . OxyCODONE  15 mg Oral Q12H  . potassium chloride  40 mEq Oral Daily  . saccharomyces boulardii  250 mg Oral BID  . sodium chloride  3 mL Intravenous Q12H  . zolpidem  10 mg Oral QHS   Continuous Infusions: . sodium chloride 20 mL/hr at 09/07/13 2340  . [name] KitchenTPN (CLINIMIX-E) Adult 125 mL/hr at 09/07/13 1832   And  . fat emulsion 250 mL (09/07/13 1832)  . [name] KitchenTPN (CLINIMIX-E) Adult     And  . fat emulsion      Principal Problem:   Entero-atmospheric fistula Active Problems:   Diabetes mellitus, insulin dependent (IDDM), uncontrolled   HYPERLIPIDEMIA   Obesity, Class III, BMI 40-49.9 (morbid obesity)   OBSTRUCTIVE SLEEP APNEA   Hypertension   Acute-on-chronic respiratory failure secondary to probable community-acquired pneumonia   Fever   Sepsis   Constipation, chronic   Chronic combined systolic and diastolic CHF (congestive heart failure)   Right leg pain   Leukocytosis   AAA (abdominal  aortic aneurysm)/ 4.5 cm ascending per CT angio 08/01/13   Infected prosthetic mesh of abdominal wall with GIANT abscess s/p removal 08/14/2013   Recurrent ventral incisional hernia   Acute renal failure (ARF)    Time spent: 30 min    Yasmen Cortner, [facility] Pager [phone]. If 7PM-7AM, please contact night-coverage at [email or web address], password [facility] 09/08/2013, 5:24 PM  LOS: 28 days

## 2013-09-08 NOTE — Progress Notes (Signed)
Physical Therapy Treatment Patient Details Name: Kurt Cross MRN: [id number] DOB: Jul 06, 1951 Today's Date: 09/08/2013 Time: [id number] PT Time Calculation (min): 11 min  PT Assessment / Plan / Recommendation  History of Present Illness Kurt Cross is a 62 y.o. male  with known history of chronic systolic heart failure status post defibrillator placement last EF measured was in 2013 was 35-40%, on home oxygen, OSA noncompliant with CPAP, diabetes mellitus, hypertension and hyperlipidemia presented to the ER because right leg pain after sustaining a fall the night prior to admission.  08/14/13: s/p abdominal abscess removal   PT Comments   Pt ambulated in hallway.  Pt also demonstrated a few exercises he reports he has been performing.  Pt about at supervision level now in regards to mobility however would like PT to continue to follow.  Pt also reports he has been up walking with nsg staff.  Will decrease frequency as pt up with nsg and close to meeting goals.  Follow Up Recommendations  SNF     Does the patient have the potential to tolerate intense rehabilitation     Barriers to Discharge        Equipment Recommendations  None recommended by PT    Recommendations for Other Services    Frequency Min 2X/week   Progress towards PT Goals Progress towards PT goals: Goals met and updated - see care plan  Plan Frequency needs to be updated    Precautions / Restrictions Precautions Precautions: Fall Precaution Comments: ostomy bag on R side and wall suction to ABD   Pertinent Vitals/Pain n/a    Mobility  Transfers Transfers: Sit to Stand;Stand to Sit Sit to Stand: 5: Supervision;From chair/3-in-1 Stand to Sit: 5: Supervision;To chair/3-in-1 Details for Transfer Assistance: supervision for safety lines/tubes.  Pt tends to "plop" uncontrolled, VC's for safety  Ambulation/Gait Ambulation/Gait Assistance: 4: Min guard;5: Supervision Ambulation Distance (Feet): 150 Feet Assistive  device: Rolling walker Ambulation/Gait Assistance Details: stiff gait today with increased time in extension, not fluid motion (pt states he has been sitting too long but he's fine) Gait Pattern: Step-through pattern;Wide base of support;Decreased stride length;Ataxic    Exercises     PT Diagnosis:    PT Problem List:   PT Treatment Interventions:     PT Goals (current goals can now be found in the care plan section) Acute Rehab PT Goals PT Goal Formulation: With patient Time For Goal Achievement: 09/22/13 Potential to Achieve Goals: Good  Visit Information  Last PT Received On: 09/08/13 Assistance Needed: +1 History of Present Illness: Kurt Cross is a 61 y.o. male  with known history of chronic systolic heart failure status post defibrillator placement last EF measured was in 2013 was 35-40%, on home oxygen, OSA noncompliant with CPAP, diabetes mellitus, hypertension and hyperlipidemia presented to the ER because right leg pain after sustaining a fall the night prior to admission.  08/14/13: s/p abdominal abscess removal    Subjective Data      Cognition  Cognition Arousal/Alertness: Awake/alert Behavior During Therapy: WFL for tasks assessed/performed Overall Cognitive Status: Within Functional Limits for tasks assessed    Balance     End of Session PT - End of Session Activity Tolerance: Patient tolerated treatment well Patient left: in chair;with call bell/phone within reach;with family/visitor present Nurse Communication:  (to check wall suction - not int suction during time in room)   GP     Meshulem Onorato,KATHrine E 09/08/2013, 1:53 PM Carmelia Bake, PT, DPT 09/08/2013 Pager: [phone]

## 2013-09-08 NOTE — Progress Notes (Signed)
25 Days Post-Op  Subjective: No real change  Objective: Vital signs in last 24 hours: Temp:  [98 F (36.7 C)-98.7 F (37.1 C)] 98.6 F (37 C) (12/03 0800) Pulse Rate:  [82-98] 82 (12/03 0800) Resp:  [22-28] 24 (12/03 0800) BP: (126-193)/(62-93) 151/66 mmHg (12/03 0800) SpO2:  [96 %-100 %] 96 % (12/03 0800) Weight:  [150.3 kg (331 lb 5.6 oz)] 150.3 kg (331 lb 5.6 oz) (12/03 0405) Last BM Date: 09/01/13  Intake/Output from previous day: 12/02 0701 - 12/03 0700 In: 3430.1 [I.V.:498.3; TPN:2931.8] Out: 2560 [Urine:1960; Drains:600] Intake/Output this shift:    General appearance: alert, cooperative and no distress GI: open wound with Eakins, thick green yellow effuent from fistula in base of the bag.  Lab Results:   Recent Labs  09/07/13 0419 09/08/13 0425  WBC 7.8 8.7  HGB 11.1* 11.7*  HCT 32.7* 34.2*  PLT 301 313    BMET  Recent Labs  09/06/13 0400 09/07/13 0930  NA 136 134*  K 3.7 4.1  CL 101 100  CO2 25 27  GLUCOSE 89 130*  BUN 23 24*  CREATININE 1.03 0.89  CALCIUM 8.8 8.9   PT/INR No results found for this basename: LABPROT, INR,  in the last 72 hours   Recent Labs Lab 09/02/13 0445 09/06/13 0400  AST 21 49*  ALT 12 41  ALKPHOS 48 70  BILITOT 0.8 1.2  PROT 7.3 7.2  ALBUMIN 2.1* 2.1*     Lipase  No results found for this basename: lipase     Studies/Results: No results found.  Medications: . acetaminophen  1,000 mg Oral TID  . antiseptic oral rinse  15 mL Mouth Rinse q12n4p  . apixaban  5 mg Oral BID  . atorvastatin  40 mg Oral QODAY  . carvedilol  3.125 mg Oral BID WC  . ferrous sulfate  325 mg Oral BID  . furosemide  40 mg Oral Daily  . hydrALAZINE  10 mg Intravenous Q8H  . insulin aspart  0-9 Units Subcutaneous TID WC  . insulin glargine  35 Units Subcutaneous QHS  . lip balm  1 application Topical BID  . loperamide  2 mg Oral BID  . octreotide  150 mcg Subcutaneous TID  . OxyCODONE  15 mg Oral Q12H  . potassium chloride   40 mEq Oral Daily  . saccharomyces boulardii  250 mg Oral BID  . sodium chloride  3 mL Intravenous Q12H  . zolpidem  10 mg Oral QHS    Assessment/Plan Infected prosthetic mesh of abdominal wall with GIANT abscess s/p removal 08/14/2013 Dr. 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  Diabetes mellitus, insulin dependent (IDDM), uncontrolled  HYPERLIPIDEMIA  Obesity, Class III, BMI 40-49.9 (morbid obesity)  OBSTRUCTIVE SLEEP APNEA  Acute renal insufficiency from fistula/wound loss-improved   Plan:  NPO, TNA, Eakins pouch.     LOS: 28 days    Ryman Rathgeber 09/08/2013

## 2013-09-09 LAB — COMPREHENSIVE METABOLIC PANEL
BUN: 25 mg/dL — ABNORMAL HIGH (ref 6–23)
CO2: 27 mEq/L (ref 19–32)
Calcium: 8.8 mg/dL (ref 8.4–10.5)
Creatinine, Ser: 1.03 mg/dL (ref 0.50–1.35)
GFR calc Af Amer: 89 mL/min — ABNORMAL LOW (ref >90)
Glucose, Bld: 136 mg/dL — ABNORMAL HIGH (ref 70–99)
Sodium: 133 mEq/L — ABNORMAL LOW (ref 135–145)
Total Protein: 7.1 g/dL (ref 6.0–8.3)

## 2013-09-09 LAB — CBC
Platelets: 307 10*3/uL (ref 150–400)
RBC: 3.49 MIL/uL — ABNORMAL LOW (ref 4.22–5.81)
WBC: 7.8 10*3/uL (ref 4.0–10.5)

## 2013-09-09 LAB — GLUCOSE, CAPILLARY
Glucose-Capillary: 125 mg/dL — ABNORMAL HIGH (ref 70–99)
Glucose-Capillary: 106 mg/dL — ABNORMAL HIGH (ref 70–99)
Glucose-Capillary: 110 mg/dL — ABNORMAL HIGH (ref 70–99)

## 2013-09-09 LAB — PHOSPHORUS: Phosphorus: 4.5 mg/dL (ref 2.3–4.6)

## 2013-09-09 LAB — MAGNESIUM: Magnesium: 1.8 mg/dL (ref 1.5–2.5)

## 2013-09-09 MED ORDER — FAT EMULSION 20 % IV EMUL
250.0000 mL | INTRAVENOUS | Status: AC
  Administered 2013-09-09: 250 mL via INTRAVENOUS
  Filled 2013-09-09: qty 250

## 2013-09-09 MED ORDER — HYDRALAZINE HCL 25 MG PO TABS
25.0000 mg | ORAL_TABLET | Freq: Three times a day (TID) | ORAL | Status: DC
  Administered 2013-09-09 – 2013-09-13 (×11): 25 mg via ORAL
  Filled 2013-09-09 (×14): qty 1

## 2013-09-09 MED ORDER — TRACE MINERALS CR-CU-F-FE-I-MN-MO-SE-ZN IV SOLN
INTRAVENOUS | Status: AC
  Administered 2013-09-09: 18:00:00 via INTRAVENOUS
  Filled 2013-09-09: qty 3000

## 2013-09-09 NOTE — Progress Notes (Signed)
Occupational Therapy Treatment and Discharge Note Patient Details Name: Kurt Cross MRN: [id number] DOB: 1951-02-07 Today's Date: 09/09/2013 Time: [id number] OT Time Calculation (min): 18 min  OT Assessment / Plan / Recommendation  History of present illness Kurt Cross is a 62 y.o. male  with known history of chronic systolic heart failure status post defibrillator placement last EF measured was in 2013 was 35-40%, on home oxygen, OSA noncompliant with CPAP, diabetes mellitus, hypertension and hyperlipidemia presented to the ER because right leg pain after sustaining a fall the night prior to admission.  08/14/13: s/p abdominal abscess removal   OT comments  Pt very pleasant and cooperative.  No cues needed for safety today, cues only to control descent when sitting.  OT goals have been met or discontinued.  Pt does not use commode at all and is not wearing LB clothing.    Follow Up Recommendations  LTACH (per notes, due to wall suction)    Barriers to Discharge       Equipment Recommendations  None recommended by OT    Recommendations for Other Services    Frequency     Progress towards OT Goals Progress towards OT goals: Goals met/education completed, patient discharged from OT (or discontinued due to not applying)  Plan Discharge plan needs to be updated    Precautions / Restrictions Precautions Precautions: Fall Precaution Comments: ostomy bag on R side and wall suction to ABD Restrictions Weight Bearing Restrictions: No   Pertinent Vitals/Pain No c/o pain    ADL  Grooming: Wash/dry hands;Supervision/safety Where Assessed - Grooming: Supported standing Transfers/Ambulation Related to ADLs: Pt reports that he does not need to sit on commode at all.  Ostomy bag manages 100% of output.  Ambulated in room and in hall with min guard, and min guard given for sit to stand.  He met goal with using AE on previous visit.  Will discontinue OT after this visit.      OT Diagnosis:     OT Problem List:   OT Treatment Interventions:     OT Goals(current goals can now be found in the care plan section)    Visit Information      Subjective Data      Prior Functioning       Cognition  Cognition Arousal/Alertness: Awake/alert Behavior During Therapy: WFL for tasks assessed/performed Overall Cognitive Status: Within Functional Limits for tasks assessed    Mobility  Bed Mobility Supine to Sit: 5: Supervision Sitting - Scoot to Edge of Bed: 5: Supervision Sit to Supine: 5: Supervision (using trapeze) Transfers Sit to Stand: 4: Min guard;From bed Stand to Sit: 5: Supervision;To bed Details for Transfer Assistance: tends to sit with decreased control    Exercises      Balance     End of Session OT - End of Session Activity Tolerance: Patient tolerated treatment well Patient left: in bed;with call bell/phone within reach;with nursing/sitter in room  GO     Kurt Cross 09/09/2013, 4:49 PM Kurt Cross, OTR/L [phone] 09/09/2013

## 2013-09-09 NOTE — Progress Notes (Addendum)
[facility] PROGRESS NOTE  Kurt Cross [id number] DOB: December 27, 1950 DOA: 08/11/2013 PCP: [name], MD  Assessment/Plan  Infected prosthetic gore-tex mesh with large abscess s/p I&D and removal of gore-tex on 11/8.  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 -  Blood cultures NGF -  Continue sandostatin per surgery -  Continue TNA for the foreseeable future -  Needs continuous wall suction for the foreseeable future, which cannot be provided at SNF -  S/p 2 week course of vancomycin and zosyn  Sepsis due to coag neg staph s/p 2 weeks of vancomycin  #3 HTN blood pressure trending to normal limits - change to oral hydralazine 25mg  TID -  Cont coreg and lasix   #4 atrial fibrillation/atrial flutter persistently rate controlled.  PVC.   - secondary to problem #1. Now ressolved, CE x 3 neg, 2 D ECHO EF 55%  - currently rate controlled, continue Coreg  - CHADSVASc score is 4 (not ASVD plaque on abd aorta), resumed eliquis - okay to d/c telemetry   #5 chronic kidney disease stage III, stable.   -  Minimize nephrotoxins -  Renally dose medication  #6 leukocytosis/fever resolved.    #7 chronic combined systolic and diastolic CHF  - Initial CXR with volume overload. given 4 doses of lasix 40mg  IV  -08/12/2013 ECHO EF 50-55% (EF improved compared to one year ago (35-40%))  - weight trending down 356 lbs --> 348 --> 333 --> 330 --> 250 --> 342 --> 352 --> 347 __> 331 lbs  OSA, stable.  Continue CPAP.  #8 Uncontrolled Pain  - continue prn oxycodone as needed   #9 hyperlipidemia  - Continue statin.   #10 4.5 cm ascending aortic aneurysm per CT scan of 08/01/2013  - Stable. Asymptomatic. Continue risk factor modification with good blood pressure control.   #11 type 2 diabetes hypoglycemic when trying to cycle TPN 12/2 but remained stable overnight -  Continue lantus 35 units with SSI low dose  #12. Hypomagnesemia  -   Repeat level tomorrow  Diet:  NPO with ice chips Access:  TL PICC IVF:  TPN Proph:  apixaban  Code Status: full Family Communication: patient alone Disposition Plan: Patient is not a good candidate for SNF due to complexity of care, including need for continuous TPN and continuous wall suction.  These interventions can easily be managed by an LTAC facility which is the preferable option for this patient's ongoing care.  The patient is otherwise stable and no longer meets criteria for hospitalization.     Antibiotics  Vancomycin 08/15/13>>> 08/31/13  Zosyn 08/14/13>>>08/31/13  Procedures/Studies:  Ct Abdomen Pelvis Wo Contrast 08/14/2013 14.5 x 18.1 x 26.1 cm fluid collection/abscess within the anterior abdominal wall, as described above, incompletely visualized. The collection appears just anterior to prior ventral hernia mesh. Given the size and proximity to the skin surface, incision and drainage is suggested.  Dg Chest 2 View 08/01/2013 No active cardiopulmonary disease.  Dg Hip Complete Right 08/11/2013 No acute osseous injury of the right hip.  Ct Angio Chest Pe W/cm &/or Wo Cm 08/01/2013 4.5 cm AAA without complicating features. No evidence of acute pulmonary embolism or aortic dissection.  Dg Chest Port 1 View 08/18/2013 Right upper extremity PICC terminates in the mid superior vena cava. Stable congestive heart failure pattern.  Dg Chest Port 1 View 08/17/2013 Stable chest with changes of congestive heart failure with pulmonary interstitial edema. Cardiac pacer in stable position.  Dg Chest Portable 1  View 08/15/2013 Stable cardiomegaly with interval worsening of pulmonary vascular congestion/ edema relative to 08/13/2013.  Dg Chest Port 1 View 08/13/2013 No acute findings. Stable cardiomegaly and pulmonary venous prominence without overt edema.  2-D echo 08/12/2013  Incision and drainage of abscess, Gore-Tex mesh removal, debridement of abdominal wall and peritoneum Consultants:   Cardiology: Dr. Terrence Dupont 08/13/2013  General surgery: Dr. Johney Maine 08/14/2013  ID: Dr Megan Salon 08/15/13  HPI/Subjective:  No acute events.  Objective: Filed Vitals:   09/08/13 2203 09/08/13 2335 09/09/13 0443 09/09/13 1434  BP: 129/65  158/90 145/71  Pulse: 67 63 64 79  Temp: 98 F (36.7 C)  98.3 F (36.8 C) 98.3 F (36.8 C)  TempSrc: Oral  Axillary Oral  Resp: 20 20 20 20   Height:      Weight:   150 kg (330 lb 11 oz)   SpO2: 99% 95% 98% 99%    Intake/Output Summary (Last 24 hours) at 09/09/13 1854 Last data filed at 09/09/13 1738  Gross per 24 hour  Intake   1360 ml  Output   2225 ml  Net   -865 ml   Filed Weights   09/07/13 0402 09/08/13 0405 09/09/13 0443  Weight: 151.5 kg (334 lb) 150.3 kg (331 lb 5.6 oz) 150 kg (330 lb 11 oz)    Exam:   General:  [name], No acute distress  HEENT:  NCAT, MMM  Cardiovascular:  RRR, nl S1, S2 no mrg, 2+ pulses, warm extremities  Respiratory:  CTAB, no increased WOB  Abdomen:   NABS, soft, NT/ND, clear ostomy dressing in place with yellowish stool right abdomen  MSK:   Normal tone and bulk, no LEE  Neuro:  Grossly intact  Data Reviewed: Basic Metabolic Panel:  Recent Labs Lab 09/04/13 0502 09/06/13 0400 09/07/13 0930 09/09/13 0446  NA 137 136 134* 133*  K 3.8 3.7 4.1 4.0  CL 100 101 100 99  CO2 27 25 27 27   GLUCOSE 127* 89 130* 136*  BUN 23 23 24* 25*  CREATININE 1.08 1.03 0.89 1.03  CALCIUM 9.2 8.8 8.9 8.8  MG  --  1.7  --  1.8  PHOS  --  4.4  --  4.5   Liver Function Tests:  Recent Labs Lab 09/06/13 0400 09/09/13 0446  AST 49* 30  ALT 41 28  ALKPHOS 70 65  BILITOT 1.2 1.4*  PROT 7.2 7.1  ALBUMIN 2.1* 2.0*   No results found for this basename: LIPASE, AMYLASE,  in the last 168 hours No results found for this basename: AMMONIA,  in the last 168 hours CBC:  Recent Labs Lab 09/05/13 0505 09/06/13 0400 09/07/13 0419 09/08/13 0425 09/09/13 0446  WBC 7.9 8.1 7.8 8.7 7.8  NEUTROABS  --  3.9   --   --   --   HGB 11.6* 11.1* 11.1* 11.7* 11.2*  HCT 34.6* 32.6* 32.7* 34.2* 32.6*  MCV 93.5 92.9 93.4 92.9 93.4  PLT 294 293 301 313 307   Cardiac Enzymes: No results found for this basename: CKTOTAL, CKMB, CKMBINDEX, TROPONINI,  in the last 168 hours BNP (last 3 results)  Recent Labs  08/01/13 1649  PROBNP 481.4*   CBG:  Recent Labs Lab 09/08/13 2226 09/09/13 0440 09/09/13 0724 09/09/13 1155 09/09/13 1627  GLUCAP 113* 125* 106* 117* 105*    No results found for this or any previous visit (from the past 240 hour(s)).   Studies: No results found.  Scheduled Meds: . acetaminophen  1,000 mg Oral  TID  . antiseptic oral rinse  15 mL Mouth Rinse q12n4p  . apixaban  5 mg Oral BID  . atorvastatin  40 mg Oral QODAY  . carvedilol  3.125 mg Oral BID WC  . ferrous sulfate  325 mg Oral BID  . furosemide  40 mg Oral Daily  . hydrALAZINE  10 mg Intravenous Q8H  . insulin aspart  0-9 Units Subcutaneous TID WC  . insulin glargine  35 Units Subcutaneous QHS  . lip balm  1 application Topical BID  . loperamide  2 mg Oral BID  . octreotide  150 mcg Subcutaneous TID  . OxyCODONE  15 mg Oral Q12H  . potassium chloride  40 mEq Oral Daily  . saccharomyces boulardii  250 mg Oral BID  . sodium chloride  3 mL Intravenous Q12H  . zolpidem  10 mg Oral QHS   Continuous Infusions: . sodium chloride 20 mL/hr at 09/07/13 2340  . [name] KitchenTPN (CLINIMIX-E) Adult 125 mL/hr at 09/09/13 1751   And  . fat emulsion 250 mL (09/09/13 1752)    Principal Problem:   Entero-atmospheric fistula Active Problems:   Diabetes mellitus, insulin dependent (IDDM), uncontrolled   HYPERLIPIDEMIA   Obesity, Class III, BMI 40-49.9 (morbid obesity)   OBSTRUCTIVE SLEEP APNEA   Hypertension   Acute-on-chronic respiratory failure secondary to probable community-acquired pneumonia   Fever   Sepsis   Constipation, chronic   Chronic combined systolic and diastolic CHF (congestive heart failure)   Right leg pain    Leukocytosis   AAA (abdominal aortic aneurysm)/ 4.5 cm ascending per CT angio 08/01/13   Infected prosthetic mesh of abdominal wall with GIANT abscess s/p removal 08/14/2013   Recurrent ventral incisional hernia   Acute renal failure (ARF)    Time spent: 30 min    Emmet Messer, [facility] Pager [phone]. If 7PM-7AM, please contact night-coverage at [email or web address], password [facility] 09/09/2013, 6:54 PM  LOS: 29 days

## 2013-09-09 NOTE — Progress Notes (Signed)
26 Days Post-Op  Subjective: No real change.  Objective: Vital signs in last 24 hours: Temp:  [98 F (36.7 C)-98.4 F (36.9 C)] 98.3 F (36.8 C) (12/04 0443) Pulse Rate:  [63-68] 64 (12/04 0443) Resp:  [20] 20 (12/04 0443) BP: (118-158)/(62-90) 158/90 mmHg (12/04 0443) SpO2:  [95 %-99 %] 98 % (12/04 0443) Weight:  [150 kg (330 lb 11 oz)] 150 kg (330 lb 11 oz) (12/04 0443) Last BM Date: 09/01/13  Intake/Output from previous day: 12/03 0701 - 12/04 0700 In: 2612.9 [P.O.:120; I.V.:321.7; TPN:2171.3] Out: 2000 [Urine:900; Drains:1100] Intake/Output this shift: Total I/O In: -  Out: 400 [Urine:400]  General appearance: alert, cooperative and no distress GI: soft, non-tender; bowel sounds normal; no masses,  no organomegaly and stoma site looks good the open main site with allot of feculent thick drainage.  Lab Results:   Recent Labs  09/08/13 0425 09/09/13 0446  WBC 8.7 7.8  HGB 11.7* 11.2*  HCT 34.2* 32.6*  PLT 313 307    BMET  Recent Labs  09/07/13 0930 09/09/13 0446  NA 134* 133*  K 4.1 4.0  CL 100 99  CO2 27 27  GLUCOSE 130* 136*  BUN 24* 25*  CREATININE 0.89 1.03  CALCIUM 8.9 8.8   PT/INR No results found for this basename: LABPROT, INR,  in the last 72 hours   Recent Labs Lab 09/06/13 0400 09/09/13 0446  AST 49* 30  ALT 41 28  ALKPHOS 70 65  BILITOT 1.2 1.4*  PROT 7.2 7.1  ALBUMIN 2.1* 2.0*     Lipase  No results found for this basename: lipase     Studies/Results: No results found.  Medications: . acetaminophen  1,000 mg Oral TID  . antiseptic oral rinse  15 mL Mouth Rinse q12n4p  . apixaban  5 mg Oral BID  . atorvastatin  40 mg Oral QODAY  . carvedilol  3.125 mg Oral BID WC  . ferrous sulfate  325 mg Oral BID  . furosemide  40 mg Oral Daily  . hydrALAZINE  10 mg Intravenous Q8H  . insulin aspart  0-9 Units Subcutaneous TID WC  . insulin glargine  35 Units Subcutaneous QHS  . lip balm  1 application Topical BID  . loperamide   2 mg Oral BID  . octreotide  150 mcg Subcutaneous TID  . OxyCODONE  15 mg Oral Q12H  . potassium chloride  40 mEq Oral Daily  . saccharomyces boulardii  250 mg Oral BID  . sodium chloride  3 mL Intravenous Q12H  . zolpidem  10 mg Oral QHS    Assessment/Plan Infected prosthetic mesh of abdominal wall with GIANT abscess s/p removal 08/14/2013 Dr. 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  Diabetes mellitus, insulin dependent (IDDM), uncontrolled  HYPERLIPIDEMIA  Obesity, Class III, BMI 40-49.9 (morbid obesity)  OBSTRUCTIVE SLEEP APNEA  Acute renal insufficiency from fistula/wound loss-improved    Plan:  Continue NPO, TNA, and [place], i will check and see if there is any thing else we can do about drainage.       LOS: 29 days    Merinda Victorino 09/09/2013

## 2013-09-09 NOTE — Consult Note (Addendum)
WOC wound follow up Wound type:Open abdomen with enteric fistula.  Wife and daughter present. Measurement:19cm x 9.5cm x 1.5cm with undermining nearly circumferential: 12 o'clock = 2.5cm, 3 o'clock = 6.5cm, 6 o'clock = 4cm, 8 o'clock = 3.5cm.  Peripheral edge is sealed at 9 o'clock. Drainage (amount, consistency, odor) Thick green to light brown effluent with mucus Periwound:intact.  Previous site of stoma is granulating and contracting.  Now measures 1.5cm x 2.5cm x 2cm Dressing procedure/placement/frequency: Large [place] pouch applied following cleansing and placement of a barrier of skin barrier ring around periphery.  1 sheet of silicone Mepitel placed at distal half of wound to prevent suction catheter (red rubber) from adhering to tissue. Patient tolerated procedure well. Approximate time of visit is 1 hour and 15 minutes. [place] nursing team will follow, and will remain available to this patient, the nursing and medical-surgical  team.  Please re-consult if needed in-between visits. Thanks, Maudie Flakes, MSN, RN, [place], [place], [place] [phone])

## 2013-09-09 NOTE — Progress Notes (Signed)
PARENTERAL NUTRITION CONSULT NOTE - FOLLOW UP  Pharmacy Consult for TNA Indication: Enterocutaneous fistula  No Known Allergies  Patient Measurements: Height: 6' (182.9 cm) Weight: 330 lb 11 oz (150 kg) IBW/kg (Calculated) : 77.6 Adjusted Body Weight: 102.6kg  Vital Signs: Temp: 98.3 F (36.8 C) (12/04 0443) Temp src: Axillary (12/04 0443) BP: 158/90 mmHg (12/04 0443) Pulse Rate: 64 (12/04 0443) Intake/Output from previous day: 12/03 0701 - 12/04 0700 In: 2612.9 [P.O.:120; I.V.:321.7; TPN:2171.3] Out: 2000 [Urine:900; Drains:1100]  Labs:  Recent Labs  09/07/13 0419 09/08/13 0425 09/09/13 0446  WBC 7.8 8.7 7.8  HGB 11.1* 11.7* 11.2*  HCT 32.7* 34.2* 32.6*  PLT 301 313 307    Recent Labs  09/07/13 0930 09/09/13 0446  NA 134* 133*  K 4.1 4.0  CL 100 99  CO2 27 27  GLUCOSE 130* 136*  BUN 24* 25*  CREATININE 0.89 1.03  CALCIUM 8.9 8.8  MG  --  1.8  PHOS  --  4.5  PROT  --  7.1  ALBUMIN  --  2.0*  AST  --  30  ALT  --  28  ALKPHOS  --  65  BILITOT  --  1.4*   Estimated Creatinine Clearance: 115 ml/min (by C-G formula based on Cr of 1.03).    Recent Labs  09/08/13 2226 09/09/13 0440 09/09/13 0724  GLUCAP 113* 125* 106*    Medications:  Infusions:  . sodium chloride 20 mL/hr at 09/07/13 2340  . [name] KitchenTPN (CLINIMIX-E) Adult 125 mL/hr at 09/08/13 1741   And  . fat emulsion 250 mL (09/08/13 1741)    Insulin Requirements in the past 24 hours:   Lantus decreased to 35 units sq qHS (previously 65 units last given on 12/1)   2 units SSI required  - CBG q4h with sensitive scale coverage  50 units regular insulin added to TNA   Nutritional Goals:   Re-estimated per RD 12/2:  [id number] kcal/day, 160-180 grams/day protein, 3.3-3.5 L/day fluid  Clinimix E5/15 at goal rate 125 ml/hr (3L/day) with 20% lipids at 10 ml/hr to provide 150g protein and 2675 kcal daily.  Due to how much fluid can be placed in TNA bag, Max volume is 3L.   Current Nutrition:    NPO  Clinimix 5/15 at 176ml/hr and 20% lipids at 10 ml/hr over 24 hours  IVF: NS at [place]: 62 yo male with large abdominal abscess with infected old Gore Tex mesh s/p I&D abscess, removal of mesh, debridement of abdominal wall and peritoneum. 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 TNA started 11/12 at 10pm.  Pt will likely be discharged eventually with TNA.  12/4: Pt had hypoglycemic events on 12/2 after starting cyclic regimen and TNA was changed back to 24 hour cycle for now.  Pt w/ morbid obesity and only up and walking for P/T so no advantage in this case to be on cyclic TNA.  No issues overnight per RN.   Drain output increased to 1100 mL / 24 hrs on 12/3.  UOP ok, but I/O inaccurate d/t unmeasured occurrences x2.  No reported problems with high fluid rate.  Prealbumin is improving.  Labs  Glucose: Hx DM on insulin PTA.  CBGs previously improved with addition of insulin to TNA 11/16 and increased Lantus dose, but had hypoglycemia on 12/2.  CBGs ranged 106-125 yesterday, despite large decrease in Lantus dose on 12/2.    Electrolytes: Na low (unable to adjust in premix).  K, Phos and Mag  wnl.     Renal:  SCr fairly stable.  UOP inaccurate, but ok on po lasix 40mg  and KCl 40 mEq daily.   LFTs: AST/ALT wnl.  Albumin only 2  TGs: wnl, 124 (11/13), 89 (11/17), 99 (11/24), 92 (12/1)  Prealbumin: low, 10.3 (11/13), 11.1 (11/17), 11.2(11/24), 13.6 (12/1)  Monitor fluid status very closely since TNA at high goal rate.   Octreotide started 11/17 to control fistula output.  TPN Access: Triple lumen PICC placed 11/12 TPN day#: 24  Plan:   Continue Clinimix E 5/15 at 125 ml/hr with lipids 20% at 10 ml/hr over 24 hours.     Decrease insulin to 40 units per 3L bag ( 13.3 units/L)  TNA to contain standard multivitamins and trace elements.  Continue CBG checks q 4 hours with sensitive SSI coverage.  F/U closely and adjust insulin coverage as needed.  Lantus 35  units QHS per MD  KCl 78mEq PO daily per MD  Continue [facility] at [facility]  TNA lab panels on Mondays & Thursdays  Monitor fluid status very closely.     Gretta Arab PharmD, BCPS Pager [phone] 09/09/2013 8:59 AM

## 2013-09-10 LAB — CBC
HCT: 32.7 % — ABNORMAL LOW (ref 39.0–52.0)
Hemoglobin: 11 g/dL — ABNORMAL LOW (ref 13.0–17.0)
MCH: 31.7 pg (ref 26.0–34.0)
MCV: 94.2 fL (ref 78.0–100.0)
RBC: 3.47 MIL/uL — ABNORMAL LOW (ref 4.22–5.81)
RDW: 14.5 % (ref 11.5–15.5)
WBC: 7.9 10*3/uL (ref 4.0–10.5)

## 2013-09-10 LAB — GLUCOSE, CAPILLARY
Glucose-Capillary: 127 mg/dL — ABNORMAL HIGH (ref 70–99)
Glucose-Capillary: 128 mg/dL — ABNORMAL HIGH (ref 70–99)
Glucose-Capillary: 114 mg/dL — ABNORMAL HIGH (ref 70–99)

## 2013-09-10 MED ORDER — OXYCODONE HCL 15 MG PO TABS: 15.0000 mg | ORAL_TABLET | ORAL | Status: DC | PRN

## 2013-09-10 MED ORDER — CARVEDILOL 6.25 MG PO TABS: 3.1250 mg | ORAL_TABLET | Freq: Two times a day (BID) | ORAL | Status: DC

## 2013-09-10 MED ORDER — INSULIN GLARGINE 100 UNIT/ML ~~LOC~~ SOLN: 35.0000 [IU] | Freq: Every day | SUBCUTANEOUS | Status: DC

## 2013-09-10 MED ORDER — POTASSIUM CHLORIDE CRYS ER 20 MEQ PO TBCR: 40.0000 meq | EXTENDED_RELEASE_TABLET | Freq: Every day | ORAL | Status: DC

## 2013-09-10 MED ORDER — FERROUS SULFATE 325 (65 FE) MG PO TABS: 325.0000 mg | ORAL_TABLET | Freq: Two times a day (BID) | ORAL | Status: DC

## 2013-09-10 MED ORDER — HYDRALAZINE HCL 25 MG PO TABS: 25.0000 mg | ORAL_TABLET | Freq: Three times a day (TID) | ORAL | Status: DC

## 2013-09-10 MED ORDER — SACCHAROMYCES BOULARDII 250 MG PO CAPS: 250.0000 mg | ORAL_CAPSULE | Freq: Two times a day (BID) | ORAL | Status: DC

## 2013-09-10 MED ORDER — TRACE MINERALS CR-CU-F-FE-I-MN-MO-SE-ZN IV SOLN
INTRAVENOUS | Status: AC
  Administered 2013-09-10: 17:00:00 via INTRAVENOUS
  Filled 2013-09-10: qty 3000

## 2013-09-10 MED ORDER — FUROSEMIDE 80 MG PO TABS: 40.0000 mg | ORAL_TABLET | Freq: Every day | ORAL | Status: DC

## 2013-09-10 MED ORDER — INSULIN ASPART 100 UNIT/ML ~~LOC~~ SOLN: 0.0000 [IU] | Freq: Three times a day (TID) | SUBCUTANEOUS | Status: DC

## 2013-09-10 MED ORDER — ALUM & MAG HYDROXIDE-SIMETH 200-200-20 MG/5ML PO SUSP: 30.0000 mL | Freq: Four times a day (QID) | ORAL | Status: DC | PRN

## 2013-09-10 MED ORDER — BIOTENE DRY MOUTH MT LIQD: 15.0000 mL | Freq: Two times a day (BID) | OROMUCOSAL | Status: DC

## 2013-09-10 MED ORDER — ACETAMINOPHEN 500 MG PO TABS: 1000.0000 mg | ORAL_TABLET | Freq: Three times a day (TID) | ORAL | Status: DC

## 2013-09-10 MED ORDER — LOPERAMIDE HCL 2 MG PO CAPS: 2.0000 mg | ORAL_CAPSULE | Freq: Two times a day (BID) | ORAL | Status: DC

## 2013-09-10 MED ORDER — ZOLPIDEM TARTRATE 10 MG PO TABS: 10.0000 mg | ORAL_TABLET | Freq: Every day | ORAL | Status: DC

## 2013-09-10 MED ORDER — OCTREOTIDE ACETATE 100 MCG/ML IJ SOLN: 150.0000 ug | Freq: Three times a day (TID) | INTRAMUSCULAR | Status: DC

## 2013-09-10 MED ORDER — FAT EMULSION 20 % IV EMUL
250.0000 mL | INTRAVENOUS | Status: AC
  Administered 2013-09-10: 17:00:00 250 mL via INTRAVENOUS
  Filled 2013-09-10: qty 250

## 2013-09-10 MED ORDER — OXYCODONE HCL ER 15 MG PO T12A: 15.0000 mg | EXTENDED_RELEASE_TABLET | Freq: Two times a day (BID) | ORAL | Status: DC

## 2013-09-10 MED ORDER — POLYVINYL ALCOHOL 1.4 % OP SOLN: 2.0000 [drp] | OPHTHALMIC | Status: DC | PRN

## 2013-09-10 MED ORDER — LOPERAMIDE HCL 2 MG PO CAPS: 2.0000 mg | ORAL_CAPSULE | Freq: Three times a day (TID) | ORAL | Status: DC | PRN

## 2013-09-10 NOTE — Discharge Summary (Addendum)
Physician Discharge Summary  KELLYN RUDY [id number] DOB: March 21, 1951 DOA: 08/11/2013  PCP: [name], MD  Admit date: 08/11/2013 Discharge date: 09/10/2013  Recommendations for Outpatient Follow-up:  1. Transfer to [facility] for ongoing care 2. Weight patient daily and if he gains more than 3 pounds in one day or 5 pounds in one week, please increase his Lasix.  Previous home dose was Lasix 80 mg twice daily.   3. Ongoing PT/OT  Discharge Diagnoses:  Principal Problem:   Entero-atmospheric fistula Active Problems:   Diabetes mellitus, insulin dependent (IDDM), uncontrolled   HYPERLIPIDEMIA   Obesity, Class III, BMI 40-49.9 (morbid obesity)   OBSTRUCTIVE SLEEP APNEA   Hypertension   Acute-on-chronic respiratory failure secondary to probable community-acquired pneumonia   Fever   Sepsis   Constipation, chronic   Chronic combined systolic and diastolic CHF (congestive heart failure)   Right leg pain   Leukocytosis   AAA (abdominal aortic aneurysm)/ 4.5 cm ascending per CT angio 08/01/13   Infected prosthetic mesh of abdominal wall with GIANT abscess s/p removal 08/14/2013   Recurrent ventral incisional hernia   Acute renal failure (ARF)   Discharge Condition: stable, improved  Wound care:  Open abdomen with enteric fistula.  19cm x 9.5cm x 1.5cm with undermining nearly circumferential: 12 o'clock = 2.5cm, 3 o'clock = 6.5cm, 6 o'clock = 4cm, 8 o'clock = 3.5cm. Peripheral edge is sealed at 9 o'clock.  Wound bed:  Drainage (amount, consistency, odor) Thick green to light brown effluent with mucus  Periwound:intact. Previous site of stoma is granulating and contracting. Now measures 1.5cm x 2.5cm x 2cm  Dressing procedure/placement/frequency: Large Eakin pouch applied following cleansing and placement of a barrier of skin barrier ring around periphery. 1 sheet of silicone Mepitel placed at distal half of wound to prevent suction catheter (red rubber) from adhering to tissue.   Diet  recommendation: NPO with ice chips.  Continuous TPN with lipids.    Wt Readings from Last 3 Encounters:  09/10/13 149.7 kg (330 lb 0.5 oz)  09/10/13 149.7 kg (330 lb 0.5 oz)  09/10/13 149.7 kg (330 lb 0.5 oz)    History of present illness:  TAVIONE REIGNER is a 62 y.o. male  with known history of chronic systolic heart failure status post defibrillator placement last EF measured was in 2013 was 35-40%, on home oxygen, OSA noncompliant with CPAP, diabetes mellitus, hypertension and hyperlipidemia presented to the ER because right leg pain after sustaining a fall the night prior to admission.  Patient was recently discharged on 08/05/2013 after being hospitalized for community acquired pneumonia. Patient stated he took all of his antibiotics as prescribed.   Patient states was using the bathroom the night prior to admission he bent over to get the brush of the floor his legs give out and he fell on his right side. Patient stated subsequently developed right lower extremity pain to the point he could not bear weight and his wife had to use a Hoyer lift to lift a month. Patient stated the nurse came by on the morning of admission he could not get out of bed or put weight on his leg and subsequently presented to the ED. Patient denies any syncope, no fever, no chills, no chest pain, no shortness of breath, no constipation, no melena, no hematemesis, no hematochezia. Patient did have an episode of loose stool however has been on stool softeners.  Patient presented to the ED x-W[place]hell of the right hip was negative. Patient states his  right lower extremity pain is more behind his thigh and into his calf region. Basic metabolic profile done at his sodium of 133 chloride of 92 creatinine of 1.52 otherwise was within normal limits. CBC had a white count of 17.8 otherwise was within normal limits.  We were called to admit the patient for further evaluation and probable placement.    Hospital Course:   Infected  prosthetic Gore-Tex mesh with abdominal wall abscess:  The patient was admitted to and was evaluated for underlying infection. CT scan of abdomen and pelvis demonstrated abscess formation around the patient's previously implanted Gore-Tex mesh. General surgery was consult today the patient underwent infection and drainage with removal of 3 L of purulent material on November 8. Blood cultures around the time of surgery demonstrated no growth final. Postoperatively, the patient developed an enterocutaneous fistula. He was started on Sandostatin on November 17 and the dose was titrated up. His fistular outpouring initially was very liquidy and ileus, however it has changed to a more thick consistency and now appears more like stool. He was allowed ice chips by mouth, but otherwise is n.p.o. He was started on TPN which should be continued indefinitely. He has an ostomy bag placed over his enterocutaneous fistula which is connected to continuous suction. This also should be continued through the foreseeable future.  Due to his continuous infusion of TPN and his need for continuous wall suction, he was declined by skilled nursing facilities. He would be a good candidate for LTAC as they can easily accommodate these requirements.  Sepsis was due to his abdominal abscess and infected Gore-Tex mesh. His wound cultures grew coag negative staph. He was placed on vancomycin and Zosyn and continued dual therapy from around the time of admission until November 25, a 2 week course at the recommendation of infectious disease. The patient does have an ICD placed, however because blood cultures are negative, it was felt that ICD infection with less likely.  There is no evidence of vegetation on his transthoracic echocardiogram. Since cessation of antibiotics, he has had stable white blood cell count and no residual fevers.    Hypertension, with systolic blood pressures in the 180-190 range. His lasix and coreg were reduced  because of volume and blood pressure issues.  His lisinopril was stopped due to AKI.  He was started on hydralazine which was converted to oral hydralazine and his blood pressures have trended down somewhat.  He will still need some further titration of his blood pressure medications as an outpatient.  Hyperlipidemia, stable. Continue statin.  Atrial fibrillation with atrial flutter, currently rate controlled. His cardiac enzymes were negative x3. His echocardiogram demonstrated an ejection fraction of 55%. He was seen by cardiology who declined cardioversion because he maintained stable rate despite flutter.    Chronic systolic and diastolic heart failure. His initial chest x-Chavarin demonstrate volume overload.  He is normally on lasix 80mg  BID.  He was started on IV Lasix and his respiratory status improved. Had an echocardiogram on November 6 which demonstrated ejection fraction of 50-55% which is improved from an ejection fraction of 35-40% proximally one year ago. He had weights up to the 350s, however his weight has gradually trended down to 330 pounds on lasix 40mg  daily.    Ascending aortic aneurysm of 4.5 cm CT scan from the 26th. Stable and asymptomatic. Continue good blood pressure control.  Acute on chronic kidney disease stage III.  He has an acute kidney injury, likely secondary to combination of  sepsis and vancomycin. His creatinine to 1.63, however it has trended down to 1 where it has remained stable.  Surgery sleep apnea, stable. Continue CPAP.  Uncontrolled pain, he has been tolerating oxycodone as needed.  Diabetes mellitus type 2. While cycling his TPN, the patient repeatedly became hypoglycemic despite drastically reduced doses of Lantus. He should remain on continuous TPN for now.  Lantus reduced to 35 units, moderate dose SSI.    Iron deficiency anemia, continue iron supplementation  Hypomagnesemia due to NPO and lasix, given IV supplementation.  Antibiotics  Vancomycin  08/15/13>>> 08/31/13  Zosyn 08/14/13>>>08/31/13  Procedures/Studies:  Ct Abdomen Pelvis Wo Contrast 08/14/2013 14.5 x 18.1 x 26.1 cm fluid collection/abscess within the anterior abdominal wall, as described above, incompletely visualized. The collection appears just anterior to prior ventral hernia mesh. Given the size and proximity to the skin surface, incision and drainage is suggested.  Dg Chest 2 View 08/01/2013 No active cardiopulmonary disease.  Dg Hip Complete Right 08/11/2013 No acute osseous injury of the right hip.  Ct Angio Chest Pe W/cm &/or Wo Cm 08/01/2013 4.5 cm AAA without complicating features. No evidence of acute pulmonary embolism or aortic dissection.  Dg Chest Port 1 View 08/18/2013 Right upper extremity PICC terminates in the mid superior vena cava. Stable congestive heart failure pattern.  Dg Chest Port 1 View 08/17/2013 Stable chest with changes of congestive heart failure with pulmonary interstitial edema. Cardiac pacer in stable position.  Dg Chest Portable 1 View 08/15/2013 Stable cardiomegaly with interval worsening of pulmonary vascular congestion/ edema relative to 08/13/2013.  Dg Chest Port 1 View 08/13/2013 No acute findings. Stable cardiomegaly and pulmonary venous prominence without overt edema.  2-D echo 08/12/2013  Incision and drainage of abscess, Gore-Tex mesh removal, debridement of abdominal wall and peritoneum Consultants:  Cardiology: Dr. Terrence Dupont 08/13/2013  General surgery: Dr. Johney Maine 08/14/2013  ID: Dr Megan Salon 08/15/13   Discharge Exam: Filed Vitals:   09/10/13 0436  BP: 145/62  Pulse: 63  Temp: 98.5 F (36.9 C)  Resp: 18   Filed Vitals:   09/09/13 1434 09/09/13 2044 09/09/13 2230 09/10/13 0436  BP: 145/71 143/77  145/62  Pulse: 79 71 77 63  Temp: 98.3 F (36.8 C) 98.6 F (37 C)  98.5 F (36.9 C)  TempSrc: Oral Oral  Oral  Resp: 20 20 20 18   Height:      Weight:    149.7 kg (330 lb 0.5 oz)  SpO2: 99% 98%  97%   General: [name], No  acute distress  HEENT: NCAT, MMM  Cardiovascular: RRR, nl S1, S2 no mrg, 2+ pulses, warm extremities  Respiratory: CTAB, no increased WOB  Abdomen: NABS, soft, NT/ND, clear ostomy dressing in place with yellowish stool right abdomen  MSK: Normal tone and bulk, no LEE  Neuro: Grossly intact   Discharge Instructions      Discharge Orders   Future Orders Complete By Expires   ([place]) Call MD:  Anytime you have any of the following symptoms: 1) 3 pound weight gain in 24 hours or 5 pounds in 1 week 2) shortness of breath, with or without a dry hacking cough 3) swelling in the hands, feet or stomach 4) if you have to sleep on extra pillows at night in order to breathe.  As directed    Call MD for:  difficulty breathing, headache or visual disturbances  As directed    Call MD for:  extreme fatigue  As directed    Call  MD for:  hives  As directed    Call MD for:  persistant dizziness or light-headedness  As directed    Call MD for:  persistant nausea and vomiting  As directed    Call MD for:  redness, tenderness, or signs of infection (pain, swelling, redness, odor or green/yellow discharge around incision site)  As directed    Call MD for:  severe uncontrolled pain  As directed    Call MD for:  temperature >100.4  As directed    Discharge wound care:  As directed    Comments:     Large Eakin pouch bag over enterocutaneous fistula, change as needed.  Bag to be connected to continuous suction.  See discharge summary for further details.   Increase activity slowly  As directed        Medication List    STOP taking these medications       insulin NPH 100 UNIT/ML injection  Commonly known as:  HUMULIN N,NOVOLIN N     levofloxacin 750 MG tablet  Commonly known as:  LEVAQUIN     lisinopril 10 MG tablet  Commonly known as:  PRINIVIL,ZESTRIL     oxyCODONE-acetaminophen 5-325 MG per tablet  Commonly known as:  PERCOCET/ROXICET      TAKE these medications        acetaminophen 500 MG tablet  Commonly known as:  TYLENOL  Take 2 tablets (1,000 mg total) by mouth 3 (three) times daily.     alum & mag hydroxide-simeth 200-200-20 MG/5ML suspension  Commonly known as:  MAALOX/MYLANTA  Take 30 mLs by mouth every 6 (six) hours as needed for indigestion or heartburn (dyspepsia).     antiseptic oral rinse Liqd  15 mLs by Mouth Rinse route 2 times daily at 12 noon and 4 pm.     carvedilol 6.25 MG tablet  Commonly known as:  COREG  Take 0.5 tablets (3.125 mg total) by mouth 2 (two) times daily with a meal.     ferrous sulfate 325 (65 FE) MG tablet  Take 1 tablet (325 mg total) by mouth 2 (two) times daily.     furosemide 80 MG tablet  Commonly known as:  LASIX  Take 0.5 tablets (40 mg total) by mouth daily.     hydrALAZINE 25 MG tablet  Commonly known as:  APRESOLINE  Take 1 tablet (25 mg total) by mouth every 8 (eight) hours.     insulin aspart 100 UNIT/ML injection  Commonly known as:  novoLOG  Inject 0-9 Units into the skin 3 (three) times daily with meals.     insulin glargine 100 UNIT/ML injection  Commonly known as:  LANTUS  Inject 0.35 mLs (35 Units total) into the skin at bedtime.     loperamide 2 MG capsule  Commonly known as:  IMODIUM  Take 1 capsule (2 mg total) by mouth 2 (two) times daily.     loperamide 2 MG capsule  Commonly known as:  IMODIUM  Take 1-2 capsules (2-4 mg total) by mouth every 8 (eight) hours as needed for diarrhea or loose stools (Use if >2 BM every 8 hours).     octreotide 100 MCG/ML Soln injection  Commonly known as:  SANDOSTATIN  Inject 1.5 mLs (150 mcg total) into the skin 3 (three) times daily.     oxyCODONE 15 MG immediate release tablet  Commonly known as:  ROXICODONE  Take 1 tablet (15 mg total) by mouth every 4 (four) hours as needed for moderate  pain.     OxyCODONE 15 mg T12a 12 hr tablet  Commonly known as:  OXYCONTIN  Take 1 tablet (15 mg total) by mouth every 12 (twelve) hours.      polyvinyl alcohol 1.4 % ophthalmic solution  Commonly known as:  LIQUIFILM TEARS  Place 2 drops into both eyes as needed for dry eyes.     potassium chloride SA 20 MEQ tablet  Commonly known as:  K-DUR,KLOR-CON  Take 2 tablets (40 mEq total) by mouth daily.     rosuvastatin 20 MG tablet  Commonly known as:  CRESTOR  Take 20 mg by mouth every other day.     saccharomyces boulardii 250 MG capsule  Commonly known as:  FLORASTOR  Take 1 capsule (250 mg total) by mouth 2 (two) times daily.     zolpidem 10 MG tablet  Commonly known as:  AMBIEN  Take 1 tablet (10 mg total) by mouth at bedtime.       Follow-up Information   Follow up with [name], MD In 3 weeks.   Specialty:  Cardiology   Contact information:   [place] [address] [phone]       Follow up with [name], MD. Schedule an appointment as soon as possible for a visit in 2 weeks. (from time of discharge)    Specialty:  Internal Medicine   Contact information:   [address] [place] [address] [phone]       The results of significant diagnostics from this hospitalization (including imaging, microbiology, ancillary and laboratory) are listed below for reference.    Significant Diagnostic Studies: Ct Abdomen Pelvis Wo Contrast  08/14/2013   CLINICAL DATA:  Fever, leukocytosis, prior hernia repairs and appendectomy  EXAM: CT ABDOMEN AND PELVIS WITHOUT CONTRAST  TECHNIQUE: Multidetector CT imaging of the abdomen and pelvis was performed following the standard protocol without intravenous contrast.  COMPARISON:  None.  FINDINGS: Note: Given body habitus, the entire anterior abdominal wall (including a portion of the abnormality) could not be included in the field of view despite two separate attempts.  Prior ventral hernia mesh repair. Just anterior to the mesh, within the anterior abdominal wall, is an abdominal wall fluid collection/abscess measuring  approximately 14.5 x 18.1 x 26.1 cm (incompletely visualized). Along the superior aspect of the fluid collection, gas extends to immediately beneath the skin surface (series 10/image 27). Bowel loops are displaced posteriorly by the large collection fluid without definite communication.  A small hernia containing fat and a loop of nondilated colon is present inferiorly (sagittal image 88), to the left of midline (series 10/image 81 and 86).  Minimal left basilar atelectasis.  Cardiomegaly. Coronary atherosclerosis. ICD leads, incompletely visualized.  Moderate hepatic steatosis.  Unenhanced spleen and pancreas are unremarkable.  Low-density nodular thickening of the bilateral adrenal glands, likely reflecting adrenal adenomas.  Gallbladder is notable for layering gallstone. No intrahepatic or extrahepatic ductal dilatation.  Kidneys are unremarkable. No renal calculi or hydronephrosis.  No evidence of bowel obstruction. Prior appendectomy.  Atherosclerotic calcifications of the abdominal aorta and branch vessels.  No abdominopelvic ascites.  No suspicious abdominopelvic lymphadenopathy.  Prostate is unremarkable.  Bladder is within normal limits.  Degenerative changes of the visualized thoracolumbar spine.  IMPRESSION: 14.5 x 18.1 x 26.1 cm fluid collection/abscess within the anterior abdominal wall, as described above, incompletely visualized. The collection appears just anterior to prior ventral hernia mesh.  Given the size and proximity to the  skin surface, incision and drainage is suggested.  These results were called by telephone at the time of interpretation on 08/14/2013 at 3:22 PM to Dr. Irine Seal , who verbally acknowledged these results.   Electronically Signed   By: Julian Hy M.D.   On: 08/14/2013 15:28   Dg Hip Complete Right  08/11/2013   CLINICAL DATA:  Right posterior hip pain.  EXAM: RIGHT HIP - COMPLETE 2+ VIEW  COMPARISON:  None.  FINDINGS: There is no evidence of hip fracture or  dislocation. There is no evidence of arthropathy or other focal bone abnormality.  IMPRESSION: No acute osseous injury of the right hip.   Electronically Signed   By: Kathreen Devoid   On: 08/11/2013 15:16   Dg Chest Port 1 View  08/18/2013   CLINICAL DATA:  Confirm line placement  EXAM: PORTABLE CHEST - 1 VIEW  COMPARISON:  08/17/2013  FINDINGS: Two images were taken. The patient is in semi-upright position and is slightly rotated to the right. A right upper extremity PICC has been placed, the distal tip of which is in the mid superior vena cava. Left chest wall triple lead pacemaker appears stable. Cardiomegaly. There is diffuse pulmonary vascular congestion and similar appearance of pulmonary edema. No pneumothorax or pleural effusion is appreciated.  IMPRESSION: 1. Right upper extremity PICC terminates in the mid superior vena cava. 2. Stable congestive heart failure pattern.   Electronically Signed   By: Curlene Dolphin M.D.   On: 08/18/2013 21:39   Dg Chest Port 1 View  08/17/2013   CLINICAL DATA:  Respiratory failure  EXAM: PORTABLE CHEST - 1 VIEW  COMPARISON:  08/15/2013.  FINDINGS: Persistent cardiomegaly with pulmonary vascular prominence and interstitial prominence suggesting congestive heart failure with interstitial edema noted. Interstitial pneumonitis cannot be excluded. Cardiac pacer. Lead tips in stable position. No pneumothorax. No acute osseous abnormality.  IMPRESSION: Stable chest with changes of congestive heart failure with pulmonary interstitial edema. Cardiac pacer in stable position.   Electronically Signed   By: Marcello Moores  Register   On: 08/17/2013 07:41   Dg Chest Portable 1 View  08/15/2013   CLINICAL DATA:  Sepsis, respiratory distress  EXAM: PORTABLE CHEST - 1 VIEW  COMPARISON:  Prior radiograph from 08/13/2013  FINDINGS: Left-sided pacemaker is unchanged. Cardiomegaly is stable as compared to the prior exam.  Lungs are normally inflated. There has been worsening of pulmonary  vascular congestion and edema as compared to the prior examination. No definite pleural effusion. No focal infiltrates appreciated. No pneumothorax.  Osseous structures are unchanged.  IMPRESSION: Stable cardiomegaly with interval worsening of pulmonary vascular congestion/ edema relative to 08/13/2013.   Electronically Signed   By: Jeannine Boga M.D.   On: 08/15/2013 01:16   Dg Chest Port 1 View  08/13/2013   CLINICAL DATA:  Leukocytosis.  EXAM: PORTABLE CHEST - 1 VIEW  COMPARISON:  08/01/2013  FINDINGS: No focal pulmonary consolidation is identified. There is stable mild pulmonary venous prominence without overt edema or visualized pleural fluid. The heart remains mildly enlarged. There is stable appearance of a biventricular pacemaker.  IMPRESSION: No acute findings. Stable cardiomegaly and pulmonary venous prominence without overt edema.   Electronically Signed   By: Aletta Edouard M.D.   On: 08/13/2013 15:44    Microbiology: No results found for this or any previous visit (from the past 240 hour(s)).   Labs: Basic Metabolic Panel:  Recent Labs Lab 09/04/13 0502 09/06/13 0400 09/07/13 0930 09/09/13 0446  NA 137  136 134* 133*  K 3.8 3.7 4.1 4.0  CL 100 101 100 99  CO2 27 25 27 27   GLUCOSE 127* 89 130* 136*  BUN 23 23 24* 25*  CREATININE 1.08 1.03 0.89 1.03  CALCIUM 9.2 8.8 8.9 8.8  MG  --  1.7  --  1.8  PHOS  --  4.4  --  4.5   Liver Function Tests:  Recent Labs Lab 09/06/13 0400 09/09/13 0446  AST 49* 30  ALT 41 28  ALKPHOS 70 65  BILITOT 1.2 1.4*  PROT 7.2 7.1  ALBUMIN 2.1* 2.0*   No results found for this basename: LIPASE, AMYLASE,  in the last 168 hours No results found for this basename: AMMONIA,  in the last 168 hours CBC:  Recent Labs Lab 09/06/13 0400 09/07/13 0419 09/08/13 0425 09/09/13 0446 09/10/13 0601  WBC 8.1 7.8 8.7 7.8 7.9  NEUTROABS 3.9  --   --   --   --   HGB 11.1* 11.1* 11.7* 11.2* 11.0*  HCT 32.6* 32.7* 34.2* 32.6* 32.7*  MCV  92.9 93.4 92.9 93.4 94.2  PLT 293 301 313 307 324   Cardiac Enzymes: No results found for this basename: CKTOTAL, CKMB, CKMBINDEX, TROPONINI,  in the last 168 hours BNP: BNP (last 3 results)  Recent Labs  08/01/13 1649  PROBNP 481.4*   CBG:  Recent Labs Lab 09/09/13 1627 09/09/13 2047 09/10/13 0100 09/10/13 0422 09/10/13 0804  GLUCAP 105* 110* 127* 128* 114*    Time coordinating discharge: 60 minutes  Signed:  Evgenia Merriman  [facility] 09/10/2013, 12:58 PM

## 2013-09-10 NOTE — Progress Notes (Signed)
RT placed patient on CPAP on auto titrate settings. Min 5 Max 20. Patient is tolerating well at this time.

## 2013-09-10 NOTE — Progress Notes (Signed)
27 Days Post-Op  Subjective: No change, he is getting up with walker now.   Eakins pouch changed yesterday.  Objective: Vital signs in last 24 hours: Temp:  [98.3 F (36.8 C)-98.6 F (37 C)] 98.5 F (36.9 C) (12/05 0436) Pulse Rate:  [63-79] 63 (12/05 0436) Resp:  [18-20] 18 (12/05 0436) BP: (143-145)/(62-77) 145/62 mmHg (12/05 0436) SpO2:  [97 %-99 %] 97 % (12/05 0436) Weight:  [149.7 kg (330 lb 0.5 oz)] 149.7 kg (330 lb 0.5 oz) (12/05 0436) Last BM Date: 09/01/13 Afebrile VSS WBC 7.9 Intake/Output from previous day: 12/04 0701 - 12/05 0700 In: 4538.7 [I.V.:254.7; [id number] Out: 2600 [Urine:2100; Drains:500] Intake/Output this shift: Total I/O In: 0  Out: 325 [Urine:325]  General appearance: alert, cooperative and no distress GI: soft, non-tender; bowel sounds normal; no masses,  no organomegaly and open wound looks the same  Lab Results:   Recent Labs  09/09/13 0446 09/10/13 0601  WBC 7.8 7.9  HGB 11.2* 11.0*  HCT 32.6* 32.7*  PLT 307 324    BMET  Recent Labs  09/09/13 0446  NA 133*  K 4.0  CL 99  CO2 27  GLUCOSE 136*  BUN 25*  CREATININE 1.03  CALCIUM 8.8   PT/INR No results found for this basename: LABPROT, INR,  in the last 72 hours   Recent Labs Lab 09/06/13 0400 09/09/13 0446  AST 49* 30  ALT 41 28  ALKPHOS 70 65  BILITOT 1.2 1.4*  PROT 7.2 7.1  ALBUMIN 2.1* 2.0*     Lipase  No results found for this basename: lipase     Studies/Results: No results found.  Medications: . acetaminophen  1,000 mg Oral TID  . antiseptic oral rinse  15 mL Mouth Rinse q12n4p  . apixaban  5 mg Oral BID  . atorvastatin  40 mg Oral QODAY  . carvedilol  3.125 mg Oral BID WC  . ferrous sulfate  325 mg Oral BID  . furosemide  40 mg Oral Daily  . hydrALAZINE  25 mg Oral Q8H  . insulin aspart  0-9 Units Subcutaneous TID WC  . insulin glargine  35 Units Subcutaneous QHS  . lip balm  1 application Topical BID  . loperamide  2 mg Oral BID  .  octreotide  150 mcg Subcutaneous TID  . OxyCODONE  15 mg Oral Q12H  . potassium chloride  40 mEq Oral Daily  . saccharomyces boulardii  250 mg Oral BID  . sodium chloride  3 mL Intravenous Q12H  . zolpidem  10 mg Oral QHS    Assessment/Plan Infected prosthetic mesh of abdominal wall with GIANT abscess s/p removal 08/14/2013 Dr. 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  Diabetes mellitus, insulin dependent (IDDM), uncontrolled  HYPERLIPIDEMIA  Obesity, Class III, BMI 40-49.9 (morbid obesity)  OBSTRUCTIVE SLEEP APNEA  Acute renal insufficiency from fistula/wound loss-improved  Deconditioning  Plan:  Continued TNA, bowel rest, he has PT & OT. He is very weak and deconditioned now.      LOS: 30 days    Kurt Cross 09/10/2013

## 2013-09-10 NOTE — Progress Notes (Signed)
PARENTERAL NUTRITION CONSULT NOTE - FOLLOW UP  Pharmacy Consult for TNA Indication: Enterocutaneous fistula  No Known Allergies  Patient Measurements: Height: 6' (182.9 cm) Weight: 330 lb 0.5 oz (149.7 kg) IBW/kg (Calculated) : 77.6 Adjusted Body Weight: 102.6kg  Vital Signs: Temp: 98.5 F (36.9 C) (12/05 0436) Temp src: Oral (12/05 0436) BP: 145/62 mmHg (12/05 0436) Pulse Rate: 63 (12/05 0436) Intake/Output from previous day: 12/04 0701 - 12/05 0700 In: 1973.7 [I.V.:254.7; [id number] Out: 1875 [id number]  Labs:  Recent Labs  09/08/13 0425 09/09/13 0446 09/10/13 0601  WBC 8.7 7.8 7.9  HGB 11.7* 11.2* 11.0*  HCT 34.2* 32.6* 32.7*  PLT 313 307 324    Recent Labs  09/09/13 0446  NA 133*  K 4.0  CL 99  CO2 27  GLUCOSE 136*  BUN 25*  CREATININE 1.03  CALCIUM 8.8  MG 1.8  PHOS 4.5  PROT 7.1  ALBUMIN 2.0*  AST 30  ALT 28  ALKPHOS 65  BILITOT 1.4*   Estimated Creatinine Clearance: 114.8 ml/min (by C-G formula based on Cr of 1.03).    Recent Labs  09/10/13 0100 09/10/13 0422 09/10/13 0804  GLUCAP 127* 128* 114*    Medications:  Infusions:  . sodium chloride 20 mL/hr (09/10/13 0108)  . [name] KitchenTPN (CLINIMIX-E) Adult 125 mL/hr at 09/09/13 1751   And  . fat emulsion 250 mL (09/09/13 1752)    Insulin Requirements in the past 24 hours:   Lantus decreased to 35 units sq qHS on 12/2 (previously 65 units from 11/10-12/1)   0 units SSI required  - CBG q4h with sensitive scale coverage  40 units regular insulin added to TNA    Nutritional Goals:   Re-estimated per RD 12/2:  [id number] kcal/day, 160-180 grams/day protein, 3.3-3.5 L/day fluid  Clinimix E5/15 at goal rate 125 ml/hr (3L/day) with 20% lipids at 10 ml/hr to provide 150g protein and 2675 kcal daily.  Due to how much fluid can be placed in TNA bag, Max volume is 3L.   Current Nutrition:   NPO  Clinimix 5/15 at 156ml/hr and 20% lipids at 10 ml/hr over 24 hours  IVF: NS at  [place]: 62 yo male with large abdominal abscess with infected old Gore Tex mesh s/p I&D abscess, removal of mesh, debridement of abdominal wall and peritoneum. 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 TNA started 11/12 at 10pm.  Pt will likely be discharged eventually with TNA.  12/5: Pt continues 24 hour TPN without complications.  Pt previously had hypoglycemic events on 12/2 after starting cyclic regimen.  Since pt w/ morbid obesity and walking for P/T, and normal LFTs, there is little advantage in this case to be on cyclic TNA.  No issues overnight per RN.   Drain output was not recorded on 12/4, but Immodium and Octreotide to decrease output are helping per RN.  UOP good, 0.5 ml/kg/hr.  No reported problems with high fluid rate.  Prealbumin is improving.  Labs  Glucose: Hx DM on insulin PTA.  CBGs ranged 105-128 yesterday, despite large decrease in Lantus dose on 12/2 (after hypoglycemic event) and decreased insulin in TPN bag.  Electrolytes: Na low (unable to adjust in premix).  K, Phos and Mag wnl.     Renal:  SCr fairly stable.  UOP inaccurate, but ok on po lasix 40mg  and KCl 40 mEq daily.   LFTs: AST/ALT wnl.  Albumin only 2  TGs: wnl, 124 (11/13), 89 (11/17), 99 (11/24), 92 (12/1)  Prealbumin: low,  10.3 (11/13), 11.1 (11/17), 11.2(11/24), 13.6 (12/1)  TPN Access: Triple lumen PICC placed 11/12 TPN day#: 24  Plan:   Continue Clinimix E 5/15 at 125 ml/hr with lipids 20% at 10 ml/hr over 24 hours.     Continue insulin 40 units per 3L bag ( 13.3 units/L)  TNA to contain standard multivitamins and trace elements.  Continue CBG checks q 4 hours with sensitive SSI coverage.  F/U closely and adjust insulin coverage as needed.  Lantus 35 units QHS per MD  KCl 85mEq PO daily per MD  [facility] at [facility]  TNA lab panels on Mondays & Thursdays  Monitor fluid status very closely.     Gretta Arab PharmD, BCPS Pager [phone] 09/10/2013 10:41 AM

## 2013-09-11 LAB — CBC
HCT: 31.8 % — ABNORMAL LOW (ref 39.0–52.0)
MCH: 31.9 pg (ref 26.0–34.0)
MCHC: 34 g/dL (ref 30.0–36.0)
MCV: 93.8 fL (ref 78.0–100.0)
Platelets: 326 10*3/uL (ref 150–400)
RBC: 3.39 MIL/uL — ABNORMAL LOW (ref 4.22–5.81)
RDW: 14.3 % (ref 11.5–15.5)
WBC: 8.8 10*3/uL (ref 4.0–10.5)

## 2013-09-11 LAB — GLUCOSE, CAPILLARY
Glucose-Capillary: 153 mg/dL — ABNORMAL HIGH (ref 70–99)
Glucose-Capillary: 123 mg/dL — ABNORMAL HIGH (ref 70–99)
Glucose-Capillary: 147 mg/dL — ABNORMAL HIGH (ref 70–99)

## 2013-09-11 MED ORDER — TRACE MINERALS CR-CU-F-FE-I-MN-MO-SE-ZN IV SOLN
INTRAVENOUS | Status: AC
  Administered 2013-09-11: 19:00:00 via INTRAVENOUS
  Filled 2013-09-11: qty 3000

## 2013-09-11 MED ORDER — FAT EMULSION 20 % IV EMUL
250.0000 mL | INTRAVENOUS | Status: AC
  Administered 2013-09-11: 19:00:00 250 mL via INTRAVENOUS
  Filled 2013-09-11: qty 250

## 2013-09-11 MED ORDER — INSULIN ASPART 100 UNIT/ML ~~LOC~~ SOLN
0.0000 [IU] | SUBCUTANEOUS | Status: DC
  Administered 2013-09-11: 1 [IU] via SUBCUTANEOUS
  Administered 2013-09-11: 2 [IU] via SUBCUTANEOUS

## 2013-09-11 NOTE — Progress Notes (Signed)
[facility] PROGRESS NOTE  Kurt Cross [id number] DOB: 07/28/1951 DOA: 08/11/2013 PCP: [name], MD  Assessment/Plan  Infected prosthetic gore-tex mesh with large abscess s/p I&D and removal of gore-tex on 11/8.  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 -  Continue sandostatin and TNA for the foreseeable future -  Continuous wall suction for the foreseeable future  Sepsis due to coag neg staph s/p 2 weeks of vancomycin, resolved.  #3 HTN blood pressure wnl - cont coreg, hydralazine, and lasix  #4 atrial fibrillation/atrial flutter persistently rate controlled.  PVC.   - secondary to problem #1. Now ressolved, CE x 3 neg, 2 D ECHO EF 55%  - currently rate controlled on Coreg  - CHADSVASc score is 4 (not ASVD plaque on abd aorta), resumed eliquis  #5 chronic kidney disease stage III, stable.   -  Minimize nephrotoxins -  Renally dose medication  #6 leukocytosis/fever resolved.    #7 chronic combined systolic and diastolic CHF, weight stable around 150 kg or 330lbs - Continue daily lasix  OSA, stable.  Continue CPAP.  #8 Uncontrolled Pain  - continue prn oxycodone as needed   #9 hyperlipidemia  - Continue statin.   #10 4.5 cm ascending aortic aneurysm per CT scan of 08/01/2013  - Stable. Asymptomatic. Continue risk factor modification with good blood pressure control.   #11 type 2 diabetes hypoglycemic when trying to cycle TPN 12/2 -  Continue lantus 35 units with SSI low dose  #12. Hypomagnesemia supplemented by TPN  Diet:  NPO with ice chips Access:  TL PICC IVF:  TPN Proph:  apixaban  Code Status: full Family Communication: patient alone Disposition Plan: Patient is not a good candidate for SNF due to complexity of care, including need for continuous TPN and continuous wall suction.  These interventions can easily be managed by an LTAC facility which is the preferable option for this patient's ongoing  care.  The patient is otherwise stable and no longer meets criteria for hospitalization.     Antibiotics  Vancomycin 08/15/13>>> 08/31/13  Zosyn 08/14/13>>>08/31/13  Procedures/Studies:  Ct Abdomen Pelvis Wo Contrast 08/14/2013 14.5 x 18.1 x 26.1 cm fluid collection/abscess within the anterior abdominal wall, as described above, incompletely visualized. The collection appears just anterior to prior ventral hernia mesh. Given the size and proximity to the skin surface, incision and drainage is suggested.  Dg Chest 2 View 08/01/2013 No active cardiopulmonary disease.  Dg Hip Complete Right 08/11/2013 No acute osseous injury of the right hip.  Ct Angio Chest Pe W/cm &/or Wo Cm 08/01/2013 4.5 cm AAA without complicating features. No evidence of acute pulmonary embolism or aortic dissection.  Dg Chest Port 1 View 08/18/2013 Right upper extremity PICC terminates in the mid superior vena cava. Stable congestive heart failure pattern.  Dg Chest Port 1 View 08/17/2013 Stable chest with changes of congestive heart failure with pulmonary interstitial edema. Cardiac pacer in stable position.  Dg Chest Portable 1 View 08/15/2013 Stable cardiomegaly with interval worsening of pulmonary vascular congestion/ edema relative to 08/13/2013.  Dg Chest Port 1 View 08/13/2013 No acute findings. Stable cardiomegaly and pulmonary venous prominence without overt edema.  2-D echo 08/12/2013  Incision and drainage of abscess, Gore-Tex mesh removal, debridement of abdominal wall and peritoneum Consultants:  Cardiology: Dr. Terrence Dupont 08/13/2013  General surgery: Dr. Johney Maine 08/14/2013  ID: Dr Megan Salon 08/15/13  HPI/Subjective:  No acute events.  Objective: Filed Vitals:   09/10/13 1425 09/10/13 2053 09/11/13 0500 09/11/13  0558  BP: 113/64 116/64  122/85  Pulse: 76 60  69  Temp: 97.9 F (36.6 C) 98.3 F (36.8 C)  98.6 F (37 C)  TempSrc: Oral Oral  Oral  Resp: 20 18  18   Height:      Weight:   149.8 kg (330 lb 4 oz)    SpO2: 96% 100%  98%    Intake/Output Summary (Last 24 hours) at 09/11/13 1002 Last data filed at 09/11/13 0730  Gross per 24 hour  Intake 1772.17 ml  Output   1550 ml  Net 222.17 ml   Filed Weights   09/09/13 0443 09/10/13 0436 09/11/13 0500  Weight: 150 kg (330 lb 11 oz) 149.7 kg (330 lb 0.5 oz) 149.8 kg (330 lb 4 oz)    Exam:   General:  [name], No acute distress, stable from prior  HEENT:  NCAT, MMM  Cardiovascular:  RRR, nl S1, S2 no mrg, 2+ pulses, warm extremities  Respiratory:  CTAB, no increased WOB  Abdomen:   NABS, soft, NT/ND, clear ostomy dressing in place with yellowish stool right abdomen  MSK:   Normal tone and bulk, no LEE  Neuro:  Grossly intact  Data Reviewed: Basic Metabolic Panel:  Recent Labs Lab 09/06/13 0400 09/07/13 0930 09/09/13 0446  NA 136 134* 133*  K 3.7 4.1 4.0  CL 101 100 99  CO2 25 27 27   GLUCOSE 89 130* 136*  BUN 23 24* 25*  CREATININE 1.03 0.89 1.03  CALCIUM 8.8 8.9 8.8  MG 1.7  --  1.8  PHOS 4.4  --  4.5   Liver Function Tests:  Recent Labs Lab 09/06/13 0400 09/09/13 0446  AST 49* 30  ALT 41 28  ALKPHOS 70 65  BILITOT 1.2 1.4*  PROT 7.2 7.1  ALBUMIN 2.1* 2.0*   No results found for this basename: LIPASE, AMYLASE,  in the last 168 hours No results found for this basename: AMMONIA,  in the last 168 hours CBC:  Recent Labs Lab 09/06/13 0400 09/07/13 0419 09/08/13 0425 09/09/13 0446 09/10/13 0601 09/11/13 0525  WBC 8.1 7.8 8.7 7.8 7.9 8.8  NEUTROABS 3.9  --   --   --   --   --   HGB 11.1* 11.1* 11.7* 11.2* 11.0* 10.8*  HCT 32.6* 32.7* 34.2* 32.6* 32.7* 31.8*  MCV 92.9 93.4 92.9 93.4 94.2 93.8  PLT 293 301 313 307 324 326   Cardiac Enzymes: No results found for this basename: CKTOTAL, CKMB, CKMBINDEX, TROPONINI,  in the last 168 hours BNP (last 3 results)  Recent Labs  08/01/13 1649  PROBNP 481.4*   CBG:  Recent Labs Lab 09/10/13 1204 09/10/13 1641 09/10/13 2001 09/11/13 0024  09/11/13 0726  GLUCAP 150* 84 174* 197* 153*    No results found for this or any previous visit (from the past 240 hour(s)).   Studies: No results found.  Scheduled Meds: . acetaminophen  1,000 mg Oral TID  . antiseptic oral rinse  15 mL Mouth Rinse q12n4p  . apixaban  5 mg Oral BID  . atorvastatin  40 mg Oral QODAY  . carvedilol  3.125 mg Oral BID WC  . ferrous sulfate  325 mg Oral BID  . furosemide  40 mg Oral Daily  . hydrALAZINE  25 mg Oral Q8H  . insulin aspart  0-9 Units Subcutaneous Q4H  . insulin glargine  35 Units Subcutaneous QHS  . lip balm  1 application Topical BID  . loperamide  2  mg Oral BID  . octreotide  150 mcg Subcutaneous TID  . OxyCODONE  15 mg Oral Q12H  . potassium chloride  40 mEq Oral Daily  . saccharomyces boulardii  250 mg Oral BID  . sodium chloride  3 mL Intravenous Q12H  . zolpidem  10 mg Oral QHS   Continuous Infusions: . sodium chloride 20 mL/hr (09/10/13 0108)  . [name] KitchenTPN (CLINIMIX-E) Adult 125 mL/hr at 09/10/13 1713   And  . fat emulsion 250 mL (09/10/13 1712)  . [name] KitchenTPN (CLINIMIX-E) Adult     And  . fat emulsion      Principal Problem:   Entero-atmospheric fistula Active Problems:   Diabetes mellitus, insulin dependent (IDDM), uncontrolled   HYPERLIPIDEMIA   Obesity, Class III, BMI 40-49.9 (morbid obesity)   OBSTRUCTIVE SLEEP APNEA   Hypertension   Acute-on-chronic respiratory failure secondary to probable community-acquired pneumonia   Fever   Sepsis   Constipation, chronic   Chronic combined systolic and diastolic CHF (congestive heart failure)   Right leg pain   Leukocytosis   AAA (abdominal aortic aneurysm)/ 4.5 cm ascending per CT angio 08/01/13   Infected prosthetic mesh of abdominal wall with GIANT abscess s/p removal 08/14/2013   Recurrent ventral incisional hernia   Acute renal failure (ARF)    Time spent: 30 min    Erhard Senske, [facility] Pager [phone]. If 7PM-7AM, please contact night-coverage at  [email or web address], password [facility] 09/11/2013, 10:02 AM  LOS: 31 days

## 2013-09-11 NOTE — Progress Notes (Signed)
Picked pt up in my assignment at 3pm, pt up in chair sleeping.  Assisted back to bed. No c/o

## 2013-09-11 NOTE — Progress Notes (Signed)
Patient ID: Kurt Cross, male   DOB: 12-May-1951, 62 y.o.   MRN: [id number] [facility] Surgery Progress Note:   28 Days Post-Op  Subjective: Mental status is clear. Objective: Vital signs in last 24 hours: Temp:  [97.9 F (36.6 C)-98.6 F (37 C)] 98.6 F (37 C) (12/06 0558) Pulse Rate:  [60-76] 69 (12/06 0558) Resp:  [18-20] 18 (12/06 0558) BP: (113-122)/(64-85) 122/85 mmHg (12/06 0558) SpO2:  [96 %-100 %] 98 % (12/06 0558) Weight:  [330 lb 4 oz (149.8 kg)] 330 lb 4 oz (149.8 kg) (12/06 0500)  Intake/Output from previous day: 12/05 0701 - 12/06 0700 In: 1772.2 [I.V.:228.7; TPN:1543.5] Out: 1625 [Urine:1225; Drains:400] Intake/Output this shift: Total I/O In: -  Out: 250 [Urine:250]  Physical Exam: Work of breathing is not labored.  Minimal pain.  Having a good day.    Lab Results:  Results for orders placed during the hospital encounter of 08/11/13 (from the past 48 hour(s))  GLUCOSE, CAPILLARY     Status: Abnormal   Collection Time    09/09/13 11:55 AM      Result Value Range   Glucose-Capillary 117 (*) 70 - 99 mg/dL   Comment 1 Notify RN     Comment 2 Documented in Chart    GLUCOSE, CAPILLARY     Status: Abnormal   Collection Time    09/09/13  4:27 PM      Result Value Range   Glucose-Capillary 105 (*) 70 - 99 mg/dL   Comment 1 Notify RN     Comment 2 Documented in Chart    GLUCOSE, CAPILLARY     Status: Abnormal   Collection Time    09/09/13  8:47 PM      Result Value Range   Glucose-Capillary 110 (*) 70 - 99 mg/dL  GLUCOSE, CAPILLARY     Status: Abnormal   Collection Time    09/10/13  1:00 AM      Result Value Range   Glucose-Capillary 127 (*) 70 - 99 mg/dL  GLUCOSE, CAPILLARY     Status: Abnormal   Collection Time    09/10/13  4:22 AM      Result Value Range   Glucose-Capillary 128 (*) 70 - 99 mg/dL   Comment 1 Notify RN     Comment 2 Documented in Chart    CBC     Status: Abnormal   Collection Time    09/10/13  6:01 AM      Result Value Range    WBC 7.9  4.0 - 10.5 K/uL   RBC 3.47 (*) 4.22 - 5.81 MIL/uL   Hemoglobin 11.0 (*) 13.0 - 17.0 g/dL   HCT 32.7 (*) 39.0 - 52.0 %   MCV 94.2  78.0 - 100.0 fL   MCH 31.7  26.0 - 34.0 pg   MCHC 33.6  30.0 - 36.0 g/dL   RDW 14.5  11.5 - 15.5 %   Platelets 324  150 - 400 K/uL  GLUCOSE, CAPILLARY     Status: Abnormal   Collection Time    09/10/13  8:04 AM      Result Value Range   Glucose-Capillary 114 (*) 70 - 99 mg/dL  GLUCOSE, CAPILLARY     Status: Abnormal   Collection Time    09/10/13 12:04 PM      Result Value Range   Glucose-Capillary 150 (*) 70 - 99 mg/dL  GLUCOSE, CAPILLARY     Status: None   Collection Time    09/10/13  4:41 PM      Result Value Range   Glucose-Capillary 84  70 - 99 mg/dL  GLUCOSE, CAPILLARY     Status: Abnormal   Collection Time    09/10/13  8:01 PM      Result Value Range   Glucose-Capillary 174 (*) 70 - 99 mg/dL   Comment 1 Notify RN     Comment 2 Documented in Chart    GLUCOSE, CAPILLARY     Status: Abnormal   Collection Time    09/11/13 12:24 AM      Result Value Range   Glucose-Capillary 197 (*) 70 - 99 mg/dL   Comment 1 Notify RN     Comment 2 Documented in Chart    CBC     Status: Abnormal   Collection Time    09/11/13  5:25 AM      Result Value Range   WBC 8.8  4.0 - 10.5 K/uL   RBC 3.39 (*) 4.22 - 5.81 MIL/uL   Hemoglobin 10.8 (*) 13.0 - 17.0 g/dL   HCT 31.8 (*) 39.0 - 52.0 %   MCV 93.8  78.0 - 100.0 fL   MCH 31.9  26.0 - 34.0 pg   MCHC 34.0  30.0 - 36.0 g/dL   RDW 14.3  11.5 - 15.5 %   Platelets 326  150 - 400 K/uL  GLUCOSE, CAPILLARY     Status: Abnormal   Collection Time    09/11/13  7:26 AM      Result Value Range   Glucose-Capillary 153 (*) 70 - 99 mg/dL   Comment 1 Notify RN     Comment 2 Documented in Chart      Radiology/Results: No results found.  Anti-infectives: Anti-infectives   Start     Dose/Rate Route Frequency Ordered Stop   08/31/13 0800  vancomycin (VANCOCIN) 1,750 mg in sodium chloride 0.9 % 500 mL  IVPB  Status:  Discontinued     1,750 mg 250 mL/hr over 120 Minutes Intravenous Every 24 hours 08/31/13 0753 08/31/13 1417   08/16/13 0800  vancomycin (VANCOCIN) 2,000 mg in sodium chloride 0.9 % 500 mL IVPB  Status:  Discontinued     2,000 mg 250 mL/hr over 120 Minutes Intravenous Every 24 hours 08/15/13 1411 08/31/13 0753   08/15/13 1500  vancomycin (VANCOCIN) 2,000 mg in sodium chloride 0.9 % 500 mL IVPB     2,000 mg 250 mL/hr over 120 Minutes Intravenous  Once 08/15/13 1411 08/15/13 1713   08/14/13 1600  piperacillin-tazobactam (ZOSYN) IVPB 3.375 g  Status:  Discontinued     3.375 g 12.5 mL/hr over 240 Minutes Intravenous Every 8 hours 08/14/13 1543 08/31/13 1417      Assessment/Plan: Problem List: Patient Active Problem List   Diagnosis Date Noted  . 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 Acute renal failure (ARF) 08/15/2013  . 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 Recurrent ventral incisional hernia 08/14/2013  . AAA (abdominal aortic aneurysm)/ 4.5 cm ascending per CT angio 08/01/13 08/12/2013  . Right leg pain 08/11/2013  . Leukocytosis 08/11/2013  . Chronic combined systolic and diastolic CHF (congestive heart failure) 08/04/2013  . Constipation, chronic 08/03/2013  . Sepsis 08/02/2013  . Fever 08/01/2013  . Acute-on-chronic respiratory failure secondary to probable community-acquired pneumonia 08/24/2012  . Hypertension 06/28/2012  . Diabetes mellitus, insulin dependent (IDDM), uncontrolled 09/01/2007  . HYPERLIPIDEMIA 09/01/2007  . Obesity, Class III, BMI 40-49.9 (morbid obesity) 09/01/2007  . OBSTRUCTIVE  SLEEP APNEA 09/01/2007    No change in this man with a large open wound and enterocutaneous fistula 28 Days Post-Op    LOS: 31 days   Matt B. Hassell Done, MD, [facility] [phone] beeper [phone]  09/11/2013 10:15 AM

## 2013-09-11 NOTE — Progress Notes (Signed)
PARENTERAL NUTRITION CONSULT NOTE - FOLLOW UP  Pharmacy Consult for TNA Indication: Enterocutaneous fistula  No Known Allergies  Patient Measurements: Height: 6' (182.9 cm) Weight: 330 lb 4 oz (149.8 kg) IBW/kg (Calculated) : 77.6 Adjusted Body Weight: 102.6kg  Vital Signs: Temp: 98.6 F (37 C) (12/06 0558) Temp src: Oral (12/06 0558) BP: 122/85 mmHg (12/06 0558) Pulse Rate: 69 (12/06 0558) Intake/Output from previous day: 12/05 0701 - 12/06 0700 In: 1772.2 [I.V.:228.7; TPN:1543.5] Out: 1625 [Urine:1225; Drains:400]  Labs:  Recent Labs  09/09/13 0446 09/10/13 0601 09/11/13 0525  WBC 7.8 7.9 8.8  HGB 11.2* 11.0* 10.8*  HCT 32.6* 32.7* 31.8*  PLT 307 324 326    Recent Labs  09/09/13 0446  NA 133*  K 4.0  CL 99  CO2 27  GLUCOSE 136*  BUN 25*  CREATININE 1.03  CALCIUM 8.8  MG 1.8  PHOS 4.5  PROT 7.1  ALBUMIN 2.0*  AST 30  ALT 28  ALKPHOS 65  BILITOT 1.4*   Estimated Creatinine Clearance: 114.9 ml/min (by C-G formula based on Cr of 1.03).    Recent Labs  09/10/13 1641 09/10/13 2001 09/11/13 0024  GLUCAP 84 174* 197*    Medications:  Infusions:  . sodium chloride 20 mL/hr (09/10/13 0108)  . [name] KitchenTPN (CLINIMIX-E) Adult 125 mL/hr at 09/10/13 1713   And  . fat emulsion 250 mL (09/10/13 1712)    Insulin Requirements in the past 24 hours:   Lantus decreased to 35 units sq qHS on 12/2 (previously 65 units from 11/10-12/1)   0 units SSI required in past 24 hours  - CBG q4h with sensitive scale coverage  TNA contains regular insulin 40 units per 3L bag (13.3 units/L)  Nutritional Goals:   Re-estimated per RD 12/2:  2370-2650 kcal/day, 160-180 grams/day protein, 3.3-3.5 L/day fluid  Clinimix E5/15 at goal rate 125 ml/hr (3L/day) with 20% lipids at 10 ml/hr to provide 150g protein and 2675 kcal daily.  Due to how much fluid can be placed in TNA bag, maximum volume is 3L.   Current Nutrition:   NPO  Clinimix 5/15 at 1101ml/hr and 20% lipids at  10 ml/hr over 24 hours  IVF: NS at [place]: 62 yo male with large abdominal abscess with infected old Gore Tex mesh s/p I&D abscess, removal of mesh, debridement of abdominal wall and peritoneum. 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 TNA started 11/12 at 10pm.  Pt will likely be discharged eventually with TNA.  12/6: Pt continues 24 hour continuous TPN without complications.  Pt previously had hypoglycemic events on 12/2 after starting cyclic regimen.  Since pt w/ morbid obesity and walking for P/T, and normal LFTs, there is little advantage in this case to be on cyclic TNA.   Labs  Glucose: Hx DM on insulin PTA.  CBGs 84-197 in past 24 hrs on insulin 40 units per 3L TNA bag plus sliding scale ac and hs.  Electrolytes (12/4): Na low (unable to adjust in premix TNA).  K, Phos and Mag wnl.     Renal:  SCr fairly stable.    LFTs: AST/ALT wnl.  Albumin only 2  TGs: wnl, 124 (11/13), 89 (11/17), 99 (11/24), 92 (12/1)  Prealbumin: low but improving - 10.3 (11/13), 11.1 (11/17), 11.2(11/24), 13.6 (12/1)  TPN Access: Triple lumen PICC placed 11/12 TPN day#: 25  Plan:   Continue Clinimix E 5/15 at 125 ml/hr with lipids 20% at 10 ml/hr over 24 hours.     Continue  insulin 40 units per 3L bag ( 13.3 units/L)  TNA to contain standard multivitamins and trace elements.  Continue CBG checks q4h with sensitive SSI coverage.  F/U closely and adjust insulin coverage as needed.  Lantus 35 units QHS per MD  KCl 14mEq PO daily per MD  Continue NS at [facility]  TNA lab panels on Mondays & Thursdays  Monitor fluid status very closely.     Clayburn Pert, PharmD, BCPS Pager: [phone] 09/11/2013  7:25 AM

## 2013-09-12 LAB — CBC
HCT: 31.4 % — ABNORMAL LOW (ref 39.0–52.0)
MCV: 93.2 fL (ref 78.0–100.0)
Platelets: 330 10*3/uL (ref 150–400)
RBC: 3.37 MIL/uL — ABNORMAL LOW (ref 4.22–5.81)
WBC: 9.7 10*3/uL (ref 4.0–10.5)

## 2013-09-12 LAB — GLUCOSE, CAPILLARY
Glucose-Capillary: 50 mg/dL — ABNORMAL LOW (ref 70–99)
Glucose-Capillary: 75 mg/dL (ref 70–99)
Glucose-Capillary: 159 mg/dL — ABNORMAL HIGH (ref 70–99)
Glucose-Capillary: 166 mg/dL — ABNORMAL HIGH (ref 70–99)

## 2013-09-12 MED ORDER — INSULIN GLARGINE 100 UNIT/ML ~~LOC~~ SOLN
35.0000 [IU] | Freq: Every day | SUBCUTANEOUS | Status: DC
  Filled 2013-09-12: qty 0.35

## 2013-09-12 MED ORDER — DEXTROSE 50 % IV SOLN: 25.0000 mL | Freq: Once | INTRAVENOUS | Status: AC | PRN

## 2013-09-12 MED ORDER — INSULIN ASPART 100 UNIT/ML ~~LOC~~ SOLN
0.0000 [IU] | Freq: Four times a day (QID) | SUBCUTANEOUS | Status: DC
  Administered 2013-09-12: 1 [IU] via SUBCUTANEOUS
  Administered 2013-09-12 – 2013-09-13 (×3): 2 [IU] via SUBCUTANEOUS
  Administered 2013-09-13: 3 [IU] via SUBCUTANEOUS
  Administered 2013-09-13 – 2013-09-14 (×4): 2 [IU] via SUBCUTANEOUS

## 2013-09-12 MED ORDER — DEXTROSE 50 % IV SOLN
INTRAVENOUS | Status: AC
  Administered 2013-09-12: 08:00:00 25
  Filled 2013-09-12: qty 50

## 2013-09-12 MED ORDER — FAT EMULSION 20 % IV EMUL
250.0000 mL | INTRAVENOUS | Status: AC
  Administered 2013-09-12: 18:00:00 250 mL via INTRAVENOUS
  Filled 2013-09-12: qty 250

## 2013-09-12 MED ORDER — INSULIN GLARGINE 100 UNIT/ML ~~LOC~~ SOLN: 25.0000 [IU] | Freq: Every day | SUBCUTANEOUS | Status: DC

## 2013-09-12 MED ORDER — M.V.I. ADULT IV INJ
INTRAVENOUS | Status: AC
  Administered 2013-09-12: 18:00:00 via INTRAVENOUS
  Filled 2013-09-12: qty 3000

## 2013-09-12 MED ORDER — INSULIN GLARGINE 100 UNIT/ML ~~LOC~~ SOLN
25.0000 [IU] | Freq: Every day | SUBCUTANEOUS | Status: DC
  Administered 2013-09-12: 22:00:00 25 [IU] via SUBCUTANEOUS
  Filled 2013-09-12: qty 0.25

## 2013-09-12 NOTE — Progress Notes (Signed)
Hypoglycemic episode: CBG 50 1 ampule D50 given tolerated--CBG in 15 minutes 117. Pt alert and talking. SP, RN.

## 2013-09-12 NOTE — Progress Notes (Addendum)
PARENTERAL NUTRITION CONSULT NOTE - FOLLOW UP  Pharmacy Consult for TNA Indication: Enterocutaneous fistula  No Known Allergies  Patient Measurements: Height: 6' (182.9 cm) Weight: 330 lb 11 oz (150 kg) IBW/kg (Calculated) : 77.6 Adjusted Body Weight: 102.6kg  Vital Signs: Temp: 98.6 F (37 C) (12/07 0506) Temp src: Oral (12/07 0506) BP: 142/85 mmHg (12/07 0506) Pulse Rate: 66 (12/07 0506) Intake/Output from previous day: 12/06 0701 - 12/07 0700 In: 2032.8 [P.O.:240; I.V.:231.3; TPN:1561.5] Out: 1200 [Urine:1200]  Labs:  Recent Labs  09/10/13 0601 09/11/13 0525 09/12/13 0500  WBC 7.9 8.8 9.7  HGB 11.0* 10.8* 10.7*  HCT 32.7* 31.8* 31.4*  PLT 324 326 330   No results found for this basename: NA, K, CL, CO2, GLUCOSE, BUN, CREATININE, LABCREA, CREAT24HRUR, CALCIUM, MG, PHOS, PROT, ALBUMIN, AST, ALT, ALKPHOS, BILITOT, BILIDIR, IBILI, PREALBUMIN, TRIG, CHOLHDL, CHOL,  in the last 72 hours Estimated Creatinine Clearance: 115 ml/min (by C-G formula based on Cr of 1.03).    Recent Labs  09/11/13 2337 09/12/13 0420 09/12/13 0514  GLUCAP 89 50* 117*    Medications:  Infusions:  . sodium chloride 20 mL/hr (09/10/13 0108)  . [name] KitchenTPN (CLINIMIX-E) Adult 125 mL/hr at 09/11/13 1842   And  . fat emulsion 250 mL (09/11/13 1843)    Insulin Requirements in the past 24 hours:   Lantus decreased to 25 units sq qHS 12/7 after hypoglycemic event this morning, previously reduced on 12/2 to 35 units (65 units from 11/10-12/1)   3 units SSI required in past 24 hours  - CBG q4h with sensitive scale coverage  TNA contains regular insulin 40 units per 3L bag (13.3 units/L)  Nutritional Goals:   Re-estimated per RD 12/2:  2370-2650 kcal/day, 160-180 grams/day protein, 3.3-3.5 L/day fluid  Clinimix E5/15 at goal rate 125 ml/hr (3L/day) with 20% lipids at 10 ml/hr to provide 150g protein and 2675 kcal daily.  Due to how much fluid can be placed in TNA bag, maximum volume is 3L.    Current Nutrition:   NPO  Clinimix 5/15 at 172ml/hr and 20% lipids at 10 ml/hr over 24 hours  IVF: NS at [facility]: 62 yo male with large abdominal abscess with infected old Gore Tex mesh s/p I&D abscess, removal of mesh, debridement of abdominal wall and peritoneum. 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 TNA started 11/12 at 10pm.  Pt will likely be discharged eventually with TNA.  12/7: Pt continues 24 hour continuous TPN.  Hypoglycemic event noted this morning however TNA noted to be off.  Unclear how long TNA not running.  Lantus dose reduced from 35 to 25 units and CBG checks changed to q 4 hours.  Pt previously had hypoglycemic events on 12/2 after starting cyclic regimen but has not had any issues since regimen resumed to 24hr continuous.  Since pt w/ morbid obesity, walking only for P/T, and normal LFTs, there is little advantage in this case to be on cyclic TNA.   Labs:   Glucose: Hx DM on insulin PTA.  Hypoglycemic event this morning with CBG 50. Range CBGs 50-147  in past 24 hrs on insulin 40 units per 3L TNA bag plus sliding scale ac and hs.  Electrolytes (12/4): Na low (unable to adjust in premix TNA).  K, Phos and Mag wnl.     Renal:  SCr fairly stable. UOP = 0.3 ml/kg/hr, I/O =+845ml.    LFTs: AST/ALT wnl.  Albumin only 2  TGs: wnl, 124 (11/13), 89 (11/17), 99 (  11/24), 92 (12/1)  Prealbumin: low but improving - 10.3 (11/13), 11.1 (11/17), 11.2(11/24), 13.6 (12/1)  TPN Access: Triple lumen PICC placed 11/12 TPN day#: 26  Plan:   Continue Clinimix E 5/15 at 125 ml/hr with lipids 20% at 10 ml/hr over 24 hours.     Continue insulin 40 units per 3L bag ( 13.3 units/L)  TNA to contain standard multivitamins and trace elements.  Continue CBG checks q4h with sensitive SSI coverage.  F/U closely and adjust insulin coverage as needed.  May need to increase lantus.    Lantus 25 units QHS per MD  KCl 92mEq PO daily per MD  Continue NS at [facility]  TNA  lab panels on Mondays & Thursdays  Monitor fluid status very closely.    Ralene Bathe, PharmD, BCPS 09/12/2013, 9:33 AM  Pager: [phone]

## 2013-09-12 NOTE — Progress Notes (Signed)
Patient ID: Kurt Cross, male   DOB: 1951/01/16, 62 y.o.   MRN: [id number] [facility] Surgery Progress Note:   29 Days Post-Op  Subjective: Mental status is clear.  No complaints Objective: Vital signs in last 24 hours: Temp:  [98 F (36.7 C)-99.5 F (37.5 C)] 98.6 F (37 C) (12/07 0506) Pulse Rate:  [66-73] 66 (12/07 0506) Resp:  [18-20] 20 (12/07 0506) BP: (127-142)/(69-85) 142/85 mmHg (12/07 0506) SpO2:  [95 %-100 %] 100 % (12/07 0506) Weight:  [330 lb 11 oz (150 kg)] 330 lb 11 oz (150 kg) (12/07 0506)  Intake/Output from previous day: 12/06 0701 - 12/07 0700 In: 2032.8 [P.O.:240; I.V.:231.3; TPN:1561.5] Out: 1200 [Urine:1200] Intake/Output this shift:    Physical Exam: Work of breathing is not labored.  Egan pouch controlling enteral output.    Lab Results:  Results for orders placed during the hospital encounter of 08/11/13 (from the past 48 hour(s))  GLUCOSE, CAPILLARY     Status: Abnormal   Collection Time    09/10/13 12:04 PM      Result Value Range   Glucose-Capillary 150 (*) 70 - 99 mg/dL  GLUCOSE, CAPILLARY     Status: None   Collection Time    09/10/13  4:41 PM      Result Value Range   Glucose-Capillary 84  70 - 99 mg/dL  GLUCOSE, CAPILLARY     Status: Abnormal   Collection Time    09/10/13  8:01 PM      Result Value Range   Glucose-Capillary 174 (*) 70 - 99 mg/dL   Comment 1 Notify RN     Comment 2 Documented in Chart    GLUCOSE, CAPILLARY     Status: Abnormal   Collection Time    09/11/13 12:24 AM      Result Value Range   Glucose-Capillary 197 (*) 70 - 99 mg/dL   Comment 1 Notify RN     Comment 2 Documented in Chart    CBC     Status: Abnormal   Collection Time    09/11/13  5:25 AM      Result Value Range   WBC 8.8  4.0 - 10.5 K/uL   RBC 3.39 (*) 4.22 - 5.81 MIL/uL   Hemoglobin 10.8 (*) 13.0 - 17.0 g/dL   HCT 31.8 (*) 39.0 - 52.0 %   MCV 93.8  78.0 - 100.0 fL   MCH 31.9  26.0 - 34.0 pg   MCHC 34.0  30.0 - 36.0 g/dL   RDW 14.3  11.5 -  15.5 %   Platelets 326  150 - 400 K/uL  GLUCOSE, CAPILLARY     Status: Abnormal   Collection Time    09/11/13  7:26 AM      Result Value Range   Glucose-Capillary 153 (*) 70 - 99 mg/dL   Comment 1 Notify RN     Comment 2 Documented in Chart    GLUCOSE, CAPILLARY     Status: Abnormal   Collection Time    09/11/13 12:01 PM      Result Value Range   Glucose-Capillary 123 (*) 70 - 99 mg/dL   Comment 1 Notify RN     Comment 2 Documented in Chart    GLUCOSE, CAPILLARY     Status: Abnormal   Collection Time    09/11/13  4:36 PM      Result Value Range   Glucose-Capillary 147 (*) 70 - 99 mg/dL   Comment 1 Documented in Chart  Comment 2 Notify RN    GLUCOSE, CAPILLARY     Status: None   Collection Time    09/11/13  7:53 PM      Result Value Range   Glucose-Capillary 82  70 - 99 mg/dL  GLUCOSE, CAPILLARY     Status: None   Collection Time    09/11/13 11:37 PM      Result Value Range   Glucose-Capillary 89  70 - 99 mg/dL   Comment 1 Notify RN     Comment 2 Documented in Chart    GLUCOSE, CAPILLARY     Status: Abnormal   Collection Time    09/12/13  4:20 AM      Result Value Range   Glucose-Capillary 50 (*) 70 - 99 mg/dL  CBC     Status: Abnormal   Collection Time    09/12/13  5:00 AM      Result Value Range   WBC 9.7  4.0 - 10.5 K/uL   RBC 3.37 (*) 4.22 - 5.81 MIL/uL   Hemoglobin 10.7 (*) 13.0 - 17.0 g/dL   HCT 31.4 (*) 39.0 - 52.0 %   MCV 93.2  78.0 - 100.0 fL   MCH 31.8  26.0 - 34.0 pg   MCHC 34.1  30.0 - 36.0 g/dL   RDW 14.1  11.5 - 15.5 %   Platelets 330  150 - 400 K/uL  GLUCOSE, CAPILLARY     Status: Abnormal   Collection Time    09/12/13  5:14 AM      Result Value Range   Glucose-Capillary 117 (*) 70 - 99 mg/dL    Radiology/Results: No results found.  Anti-infectives: Anti-infectives   Start     Dose/Rate Route Frequency Ordered Stop   08/31/13 0800  vancomycin (VANCOCIN) 1,750 mg in sodium chloride 0.9 % 500 mL IVPB  Status:  Discontinued     1,750  mg 250 mL/hr over 120 Minutes Intravenous Every 24 hours 08/31/13 0753 08/31/13 1417   08/16/13 0800  vancomycin (VANCOCIN) 2,000 mg in sodium chloride 0.9 % 500 mL IVPB  Status:  Discontinued     2,000 mg 250 mL/hr over 120 Minutes Intravenous Every 24 hours 08/15/13 1411 08/31/13 0753   08/15/13 1500  vancomycin (VANCOCIN) 2,000 mg in sodium chloride 0.9 % 500 mL IVPB     2,000 mg 250 mL/hr over 120 Minutes Intravenous  Once 08/15/13 1411 08/15/13 1713   08/14/13 1600  piperacillin-tazobactam (ZOSYN) IVPB 3.375 g  Status:  Discontinued     3.375 g 12.5 mL/hr over 240 Minutes Intravenous Every 8 hours 08/14/13 1543 08/31/13 1417      Assessment/Plan: Problem List: Patient Active Problem List   Diagnosis Date Noted  . 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 Acute renal failure (ARF) 08/15/2013  . 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 Recurrent ventral incisional hernia 08/14/2013  . AAA (abdominal aortic aneurysm)/ 4.5 cm ascending per CT angio 08/01/13 08/12/2013  . Right leg pain 08/11/2013  . Leukocytosis 08/11/2013  . Chronic combined systolic and diastolic CHF (congestive heart failure) 08/04/2013  . Constipation, chronic 08/03/2013  . Sepsis 08/02/2013  . Fever 08/01/2013  . Acute-on-chronic respiratory failure secondary to probable community-acquired pneumonia 08/24/2012  . Hypertension 06/28/2012  . Diabetes mellitus, insulin dependent (IDDM), uncontrolled 09/01/2007  . HYPERLIPIDEMIA 09/01/2007  . Obesity, Class III, BMI 40-49.9 (morbid obesity) 09/01/2007  . OBSTRUCTIVE SLEEP APNEA 09/01/2007    Continue TNA and support  for enterocutaneous fistula 29 Days Post-Op    LOS: 32 days   Matt B. Hassell Done, MD, [facility] [phone] beeper [phone]  09/12/2013 10:35 AM

## 2013-09-12 NOTE — Progress Notes (Addendum)
[facility] PROGRESS NOTE  Kurt Cross [id number] DOB: [birth year] DOA: 08/11/2013 PCP: Kurt Gravel, MD  Assessment/Plan  Infected prosthetic gore-tex mesh with large abscess s/p I&D and removal of gore-tex on 11/8.  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 -  Continue sandostatin and TNA for the foreseeable future -  Continuous wall suction for the foreseeable future  Sepsis due to coag neg staph s/p 2 weeks of vancomycin, resolved.  #3 HTN blood pressure elevated - cont coreg and lasix -  Increase hydralazine  #4 atrial fibrillation/atrial flutter persistently rate controlled.  PVC.   - secondary to problem #1. Now ressolved, CE x 3 neg, 2 D ECHO EF 55%  - currently rate controlled on Coreg  - CHADSVASc score is 4 (not ASVD plaque on abd aorta), resumed eliquis  #5 chronic kidney disease stage III, stable  -  Minimize nephrotoxins -  Renally dose medication  #6 leukocytosis/fever resolved.    #7 chronic combined systolic and diastolic CHF, weight stable around 150 kg or 330lbs - Continue daily lasix  OSA, stable.  Continue CPAP.  #8 Uncontrolled Pain  - continue prn oxycodone as needed   #9 hyperlipidemia  - Continue statin.   #10 4.5 cm ascending aortic aneurysm per CT scan of 08/01/2013  - Stable. Asymptomatic. Continue risk factor modification with good blood pressure control.    #11 type 2 diabetes hypoglycemic when trying to cycle TPN 12/2.  Hypoglycemic 12/7 because TPN was off.   -  Increase lantus to 30 units  -  Continue SSI low dose  #12. Hypomagnesemia supplemented by TPN  Diet:  NPO with ice chips Access:  TL PICC IVF:  TPN Proph:  apixaban  Code Status: full Family Communication: patient alone Disposition Plan: Patient is not a good candidate for SNF due to complexity of care, including need for continuous TPN and continuous wall suction.  These interventions can easily be managed by an  LTAC facility which is the preferable option for this patient's ongoing care.  The patient is otherwise stable and no longer meets criteria for hospitalization.     Antibiotics  Vancomycin 08/15/13>>> 08/31/13  Zosyn 08/14/13>>>08/31/13  Procedures/Studies:  Ct Abdomen Pelvis Wo Contrast 08/14/2013 14.5 x 18.1 x 26.1 cm fluid collection/abscess within the anterior abdominal wall, as described above, incompletely visualized. The collection appears just anterior to prior ventral hernia mesh. Given the size and proximity to the skin surface, incision and drainage is suggested.  Dg Chest 2 View 08/01/2013 No active cardiopulmonary disease.  Dg Hip Complete Right 08/11/2013 No acute osseous injury of the right hip.  Ct Angio Chest Pe W/cm &/or Wo Cm 08/01/2013 4.5 cm AAA without complicating features. No evidence of acute pulmonary embolism or aortic dissection.  Dg Chest Port 1 View 08/18/2013 Right upper extremity PICC terminates in the mid superior vena cava. Stable congestive heart failure pattern.  Dg Chest Port 1 View 08/17/2013 Stable chest with changes of congestive heart failure with pulmonary interstitial edema. Cardiac pacer in stable position.  Dg Chest Portable 1 View 08/15/2013 Stable cardiomegaly with interval worsening of pulmonary vascular congestion/ edema relative to 08/13/2013.  Dg Chest Port 1 View 08/13/2013 No acute findings. Stable cardiomegaly and pulmonary venous prominence without overt edema.  2-D echo 08/12/2013  Incision and drainage of abscess, Gore-Tex mesh removal, debridement of abdominal wall and peritoneum Consultants:  Cardiology: Dr. Terrence Dupont 08/13/2013  General surgery: Dr. Johney Maine 08/14/2013  ID: Dr Megan Salon 08/15/13  HPI/Subjective:  Doing well.  Ostomy bag leaking again  Objective: Filed Vitals:   09/11/13 0558 09/11/13 1340 09/11/13 2153 09/12/13 0506  BP: 122/85 127/75 142/69 142/85  Pulse: 69 70 73 66  Temp: 98.6 F (37 C) 98 F (36.7 C) 99.5 F (37.5  C) 98.6 F (37 C)  TempSrc: Oral Oral Oral Oral  Resp: 18 20 18 20   Height:      Weight:    150 kg (330 lb 11 oz)  SpO2: 98% 97% 95% 100%    Intake/Output Summary (Last 24 hours) at 09/12/13 1057 Last data filed at 09/12/13 0508  Gross per 24 hour  Intake 2032.84 ml  Output    950 ml  Net 1082.84 ml   Filed Weights   09/10/13 0436 09/11/13 0500 09/12/13 0506  Weight: 149.7 kg (330 lb 0.5 oz) 149.8 kg (330 lb 4 oz) 150 kg (330 lb 11 oz)    Exam:   General:  [name], No acute distress, stable from prior  HEENT:  NCAT, MMM  Cardiovascular:  RRR, nl S1, S2 no mrg, 2+ pulses, warm extremities  Respiratory:  CTAB, no increased WOB  Abdomen:   NABS, soft, NT/ND, clear ostomy dressing in place with orange-brown liquid stool right abdomen  MSK:   Normal tone and bulk, no LEE  Neuro:  Grossly intact  Data Reviewed: Basic Metabolic Panel:  Recent Labs Lab 09/06/13 0400 09/07/13 0930 09/09/13 0446  NA 136 134* 133*  K 3.7 4.1 4.0  CL 101 100 99  CO2 25 27 27   GLUCOSE 89 130* 136*  BUN 23 24* 25*  CREATININE 1.03 0.89 1.03  CALCIUM 8.8 8.9 8.8  MG 1.7  --  1.8  PHOS 4.4  --  4.5   Liver Function Tests:  Recent Labs Lab 09/06/13 0400 09/09/13 0446  AST 49* 30  ALT 41 28  ALKPHOS 70 65  BILITOT 1.2 1.4*  PROT 7.2 7.1  ALBUMIN 2.1* 2.0*   No results found for this basename: LIPASE, AMYLASE,  in the last 168 hours No results found for this basename: AMMONIA,  in the last 168 hours CBC:  Recent Labs Lab 09/06/13 0400  09/08/13 0425 09/09/13 0446 09/10/13 0601 09/11/13 0525 09/12/13 0500  WBC 8.1  < > 8.7 7.8 7.9 8.8 9.7  NEUTROABS 3.9  --   --   --   --   --   --   HGB 11.1*  < > 11.7* 11.2* 11.0* 10.8* 10.7*  HCT 32.6*  < > 34.2* 32.6* 32.7* 31.8* 31.4*  MCV 92.9  < > 92.9 93.4 94.2 93.8 93.2  PLT 293  < > 313 307 324 326 330  < > = values in this interval not displayed. Cardiac Enzymes: No results found for this basename: CKTOTAL, CKMB,  CKMBINDEX, TROPONINI,  in the last 168 hours BNP (last 3 results)  Recent Labs  08/01/13 1649  PROBNP 481.4*   CBG:  Recent Labs Lab 09/11/13 1636 09/11/13 1953 09/11/13 2337 09/12/13 0420 09/12/13 0514  GLUCAP 147* 82 89 50* 117*    No results found for this or any previous visit (from the past 240 hour(s)).   Studies: No results found.  Scheduled Meds: . acetaminophen  1,000 mg Oral TID  . antiseptic oral rinse  15 mL Mouth Rinse q12n4p  . apixaban  5 mg Oral BID  . atorvastatin  40 mg Oral QODAY  . carvedilol  3.125 mg Oral BID WC  . ferrous sulfate  325 mg Oral BID  . furosemide  40 mg Oral Daily  . hydrALAZINE  25 mg Oral Q8H  . insulin aspart  0-9 Units Subcutaneous Q6H  . insulin glargine  25 Units Subcutaneous QHS  . lip balm  1 application Topical BID  . loperamide  2 mg Oral BID  . octreotide  150 mcg Subcutaneous TID  . OxyCODONE  15 mg Oral Q12H  . potassium chloride  40 mEq Oral Daily  . saccharomyces boulardii  250 mg Oral BID  . sodium chloride  3 mL Intravenous Q12H  . zolpidem  10 mg Oral QHS   Continuous Infusions: . sodium chloride 20 mL/hr (09/10/13 0108)  . [name] KitchenTPN (CLINIMIX-E) Adult 125 mL/hr at 09/11/13 1842   And  . fat emulsion 250 mL (09/11/13 1843)  . [name] KitchenTPN (CLINIMIX-E) Adult     And  . fat emulsion      Principal Problem:   Entero-atmospheric fistula Active Problems:   Diabetes mellitus, insulin dependent (IDDM), uncontrolled   HYPERLIPIDEMIA   Obesity, Class III, BMI 40-49.9 (morbid obesity)   OBSTRUCTIVE SLEEP APNEA   Hypertension   Acute-on-chronic respiratory failure secondary to probable community-acquired pneumonia   Fever   Sepsis   Constipation, chronic   Chronic combined systolic and diastolic CHF (congestive heart failure)   Right leg pain   Leukocytosis   AAA (abdominal aortic aneurysm)/ 4.5 cm ascending per CT angio 08/01/13   Infected prosthetic mesh of abdominal wall with GIANT abscess s/p removal 08/14/2013    Recurrent ventral incisional hernia   Acute renal failure (ARF)    Time spent: 30 min    Kurt Cross, [place] [facility] Pager [phone]. If 7PM-7AM, please contact night-coverage at [email or web address], password [facility] 09/12/2013, 10:57 AM  LOS: 32 days

## 2013-09-13 LAB — CBC
HCT: 29.6 % — ABNORMAL LOW (ref 39.0–52.0)
Hemoglobin: 10.1 g/dL — ABNORMAL LOW (ref 13.0–17.0)
MCH: 31.8 pg (ref 26.0–34.0)
MCHC: 34.1 g/dL (ref 30.0–36.0)
MCV: 93.1 fL (ref 78.0–100.0)
RBC: 3.18 MIL/uL — ABNORMAL LOW (ref 4.22–5.81)

## 2013-09-13 LAB — COMPREHENSIVE METABOLIC PANEL
ALT: 47 U/L (ref 0–53)
AST: 53 U/L — ABNORMAL HIGH (ref 0–37)
Albumin: 1.9 g/dL — ABNORMAL LOW (ref 3.5–5.2)
Alkaline Phosphatase: 75 U/L (ref 39–117)
BUN: 25 mg/dL — ABNORMAL HIGH (ref 6–23)
Chloride: 97 mEq/L (ref 96–112)
Creatinine, Ser: 1.03 mg/dL (ref 0.50–1.35)
Potassium: 4.2 mEq/L (ref 3.5–5.1)
Sodium: 132 mEq/L — ABNORMAL LOW (ref 135–145)
Total Bilirubin: 1.5 mg/dL — ABNORMAL HIGH (ref 0.3–1.2)
Total Protein: 6.9 g/dL (ref 6.0–8.3)

## 2013-09-13 LAB — GLUCOSE, CAPILLARY
Glucose-Capillary: 169 mg/dL — ABNORMAL HIGH (ref 70–99)
Glucose-Capillary: 171 mg/dL — ABNORMAL HIGH (ref 70–99)

## 2013-09-13 LAB — DIFFERENTIAL
Basophils Relative: 0 % (ref 0–1)
Eosinophils Absolute: 0.3 10*3/uL (ref 0.0–0.7)
Eosinophils Relative: 4 % (ref 0–5)
Lymphocytes Relative: 32 % (ref 12–46)
Lymphs Abs: 2.5 10*3/uL (ref 0.7–4.0)
Monocytes Absolute: 1.4 10*3/uL — ABNORMAL HIGH (ref 0.1–1.0)
Neutro Abs: 3.7 10*3/uL (ref 1.7–7.7)

## 2013-09-13 LAB — PHOSPHORUS: Phosphorus: 4.2 mg/dL (ref 2.3–4.6)

## 2013-09-13 LAB — PREALBUMIN: Prealbumin: 10.3 mg/dL — ABNORMAL LOW (ref 17.0–34.0)

## 2013-09-13 LAB — MAGNESIUM: Magnesium: 1.8 mg/dL (ref 1.5–2.5)

## 2013-09-13 MED ORDER — FAT EMULSION 20 % IV EMUL
250.0000 mL | INTRAVENOUS | Status: AC
  Administered 2013-09-13: 250 mL via INTRAVENOUS
  Filled 2013-09-13: qty 250

## 2013-09-13 MED ORDER — INSULIN GLARGINE 100 UNIT/ML ~~LOC~~ SOLN
30.0000 [IU] | Freq: Every day | SUBCUTANEOUS | Status: DC
  Administered 2013-09-13: 23:00:00 30 [IU] via SUBCUTANEOUS
  Filled 2013-09-13: qty 0.3

## 2013-09-13 MED ORDER — HYDRALAZINE HCL 50 MG PO TABS
50.0000 mg | ORAL_TABLET | Freq: Three times a day (TID) | ORAL | Status: DC
  Administered 2013-09-13 – 2013-10-15 (×97): 50 mg via ORAL
  Filled 2013-09-13 (×101): qty 1

## 2013-09-13 MED ORDER — TRACE MINERALS CR-CU-F-FE-I-MN-MO-SE-ZN IV SOLN
INTRAVENOUS | Status: AC
  Administered 2013-09-13: 18:00:00 via INTRAVENOUS
  Filled 2013-09-13: qty 3000

## 2013-09-13 NOTE — Progress Notes (Signed)
Physical Therapy Treatment Patient Details Name: Kurt Cross MRN: [id number] DOB: 1951/02/01 Today's Date: 09/13/2013 Time: [id number] PT Time Calculation (min): 25 min  PT Assessment / Plan / Recommendation  History of Present Illness Kurt Cross is a 62 y.o. male  with known history of chronic systolic heart failure status post defibrillator placement last EF measured was in 2013 was 35-40%, on home oxygen, OSA noncompliant with CPAP, diabetes mellitus, hypertension and hyperlipidemia presented to the ER because right leg pain after sustaining a fall the night prior to admission.  08/14/13: s/p abdominal abscess removal   PT Comments   Assisted pt OOB to amb in [place].  Follow Up Recommendations  SNF     Does the patient have the potential to tolerate intense rehabilitation     Barriers to Discharge        Equipment Recommendations       Recommendations for Other Services    Frequency Min 2X/week   Progress towards PT Goals Progress towards PT goals: Progressing toward goals  Plan      Precautions / Restrictions Precautions Precautions: Fall Precaution Comments: ostomy bag on R side and wall suction to ABD Restrictions Weight Bearing Restrictions: No   Pertinent Vitals/Pain No c/o    Mobility  Bed Mobility Bed Mobility: Supine to Sit;Sitting - Scoot to Edge of Bed Supine to Sit: 5: Supervision Sitting - Scoot to [employer] of Bed: 5: Supervision Details for Bed Mobility Assistance: supervision for safety with lines Transfers Transfers: Sit to Stand;Stand to Sit Sit to Stand: 4: Min guard;From bed Stand to Sit: 5: Supervision;To chair/3-in-1 Details for Transfer Assistance: tends to sit with decreased control Ambulation/Gait Ambulation/Gait Assistance: 4: Min guard;5: Supervision Ambulation Distance (Feet): 150 Feet Assistive device: Rolling walker Ambulation/Gait Assistance Details: increased time Gait Pattern: Step-through pattern;Wide base of support;Decreased  stride length;Ataxic Gait velocity: decreased     PT Goals (current goals can now be found in the care plan section)    Visit Information  Last PT Received On: 09/13/13 Assistance Needed: +1 History of Present Illness: Kurt Cross is a 62 y.o. male  with known history of chronic systolic heart failure status post defibrillator placement last EF measured was in 2013 was 35-40%, on home oxygen, OSA noncompliant with CPAP, diabetes mellitus, hypertension and hyperlipidemia presented to the ER because right leg pain after sustaining a fall the night prior to admission.  08/14/13: s/p abdominal abscess removal    Subjective Data      Cognition       Balance     End of Session PT - End of Session Equipment Utilized During Treatment: Gait belt Activity Tolerance: Patient tolerated treatment well Patient left: in chair;with call bell/phone within reach;with family/visitor present   Rica Koyanagi  PTA [facility]  Acute  Rehab Pager      [phone]

## 2013-09-13 NOTE — Consult Note (Signed)
WOC called today, Eakin pouch leaking. Removed old pouch, wear time seems to be about 4 days.   Pt had stool over his abdomen and under his pannus from the Eakin pouch leaking. Cleaned the entire area. Removed the red robinson cath and cleansed thoroughly.  Enlarged the opening in the catheter as the effluent is much thicker now. Removed  Eakin and Mepitel, cleansed the wound bed and suctioned the undermined areas to clean away pooled effluent. Noted more bloody drainage today and some oozing from the central portion of the wound bed, increasing slough over the wound bed and more brown necrotic tissue at the most proximal portion of the wound.  Cut new Eakin, placed new (1) pc of Mepitel over wound bed and tucked minimally in the undermined areas. Fistula has now stomatized and is at 3 o clock. Measurements: 27cm x 6cm x 1.0cm x 7cm at 3 o'clock and 4cm from 6-8 o'clock. Provided measurements to the wife per her request. Red robinson cath introduced on the right side of the pouch, secured with pink waterproof tape and placed to LWS.  Pt requested catheter be secured to his abdomen so he can turn suction off and get up when he needs to.   2 additional Eakin pouches in the room   [facility] team will continue to follow along with you for management of EC fistula. Cheri Ayotte Shinglehouse RN,CWOCN [phone]

## 2013-09-13 NOTE — Progress Notes (Signed)
Patient interviewed and examined, agree with PA note above.  Edward Jolly MD, FACS  09/13/2013 3:37 PM

## 2013-09-13 NOTE — Progress Notes (Signed)
PARENTERAL NUTRITION CONSULT NOTE - FOLLOW UP  Pharmacy Consult for TNA Indication: Enterocutaneous fistula  No Known Allergies  Patient Measurements: Height: 6' (182.9 cm) Weight: 328 lb 7.8 oz (149 kg) IBW/kg (Calculated) : 77.6 Adjusted Body Weight: 102.6kg  Vital Signs: Temp: 97.9 F (36.6 C) (12/08 0511) Temp src: Oral (12/08 0511) BP: 143/73 mmHg (12/08 0511) Pulse Rate: 58 (12/08 0511) Intake/Output from previous day: 12/07 0701 - 12/08 0700 In: 540 [TPN:540] Out: 750 [Urine:700; Drains:50]  Labs:  Recent Labs  09/11/13 0525 09/12/13 0500 09/13/13 0402  WBC 8.8 9.7 7.9  HGB 10.8* 10.7* 10.1*  HCT 31.8* 31.4* 29.6*  PLT 326 330 305    Recent Labs  09/13/13 0402 09/13/13 0428  NA 132*  --   K 4.2  --   CL 97  --   CO2 29  --   GLUCOSE 173*  --   BUN 25*  --   CREATININE 1.03  --   CALCIUM 8.7  --   MG 1.8  --   PHOS 4.2  --   PROT 6.9  --   ALBUMIN 1.9*  --   AST 53*  --   ALT 47  --   ALKPHOS 75  --   BILITOT 1.5*  --   TRIG  --  94   Estimated Creatinine Clearance: 114.6 ml/min (by C-G formula based on Cr of 1.03).    Recent Labs  09/12/13 2006 09/13/13 0016 09/13/13 0405  GLUCAP 166* 168* 169*   Insulin Requirements in the past 24 hours:   Lantus decreased to 25 units sq qHS 12/7 after hypoglycemic event, previously reduced on 12/2 to 35 units (65 units from 11/10-12/1)   7 units SSI  - CBG q4h with sensitive scale coverage  TNA contains regular insulin 40 units per 3L bag (13.3 units/L)  Nutritional Goals:   Re-estimated per RD 12/2:  2370-2650 kcal/day, 160-180 grams/day protein, 3.3-3.5 L/day fluid  Clinimix E5/15 at goal rate 125 ml/hr (3L/day) with 20% lipids at 10 ml/hr to provide 150g protein and 2675 kcal daily.  Due to how much fluid can be placed in TNA bag, maximum volume is 3L.   Current Nutrition:   NPO except for ice chips, sips with meds  Clinimix E5/15 at 129ml/hr and 20% lipids at 10 ml/hr over 24  hours  IVF: NS at [place]: 62 yo male with large abdominal abscess with infected old Gore Tex mesh s/p I&D abscess, removal of mesh, debridement of abdominal wall and peritoneum. 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 TNA started 11/12 at 10pm.  Pt will likely be discharged eventually with TNA.   12/8: Pt continues 24 hour continuous TPN. CBGs in the 160s after insulin adjustments. Pt previously had hypoglycemic events on 12/2 after starting cyclic regimen -since pt w/ morbid obesity, walking only for P/T - there is little advantage in this case to be on cyclic TNA.  Awaiting bed at [facility], possible discharge tomorrow.  Labs:   Glucose: Hx DM on insulin PTA. CBGs 130-160s in the past 24 hours.   Electrolytes: Na low (unable to adjust in premix TNA). K, Phos and Mag wnl.     Renal:  SCr stable/wnl. UOP = 0.3 ml/kg/hr  LFTs: AST trended up to 53.  Albumin only 1.9  TGs: wnl  Prealbumin: low but improving - 10.3 (11/13), 11.1 (11/17), 11.2(11/24), 13.6 (12/1), pending lab 12/8  TPN Access: Triple lumen PICC placed 11/12 TPN day#: 27  Plan:  Continue Clinimix E 5/15 at 125 ml/hr with lipids 20% at 10 ml/hr over 24 hours.     Continue insulin 40 units per 3L bag ( 13.3 units/L)  TNA to contain standard multivitamins and trace elements.  Continue CBG checks q4h with sensitive SSI coverage.  F/U closely and adjust insulin coverage as needed.  May need to increase Lantus.     Lantus 25 units QHS per MD  KCl 90mEq PO daily per MD  Continue NS at [facility]  TNA lab panels on Mondays & Thursdays  Monitor fluid status very closely.    F/u discharge plans and TNA arrangement.  Vanessa Reader, PharmD, BCPS Pager: [phone] 7:46 AM Pharmacy #: [phone]

## 2013-09-13 NOTE — Progress Notes (Signed)
30 Days Post-Op  Subjective: Eakins just changed so it is empty, no real change in the wound.  Objective: Vital signs in last 24 hours: Temp:  [97.5 F (36.4 C)-98.7 F (37.1 C)] 97.5 F (36.4 C) (12/08 1303) Pulse Rate:  [58-64] 62 (12/08 1303) Resp:  [18-20] 20 (12/08 1303) BP: (138-174)/(71-74) 174/71 mmHg (12/08 1303) SpO2:  [97 %-98 %] 97 % (12/08 1303) Weight:  [149 kg (328 lb 7.8 oz)] 149 kg (328 lb 7.8 oz) (12/08 0500) Last BM Date: 09/01/13  Intake/Output from previous day: 12/07 0701 - 12/08 0700 In: 540 [TPN:540] Out: 750 [Urine:700; Drains:50] Intake/Output this shift: Total I/O In: -  Out: 675 [Urine:675]  General appearance: alert, cooperative and no distress Resp: clear to auscultation bilaterally GI: soft, non-tender; bowel sounds normal; no masses,  no organomegaly and no change in abdominal wall/eakins in place, clean and new.now.  Lab Results:   Recent Labs  09/12/13 0500 09/13/13 0402  WBC 9.7 7.9  HGB 10.7* 10.1*  HCT 31.4* 29.6*  PLT 330 305    BMET  Recent Labs  09/13/13 0402  NA 132*  K 4.2  CL 97  CO2 29  GLUCOSE 173*  BUN 25*  CREATININE 1.03  CALCIUM 8.7   PT/INR No results found for this basename: LABPROT, INR,  in the last 72 hours   Recent Labs Lab 09/09/13 0446 09/13/13 0402  AST 30 53*  ALT 28 47  ALKPHOS 65 75  BILITOT 1.4* 1.5*  PROT 7.1 6.9  ALBUMIN 2.0* 1.9*     Lipase  No results found for this basename: lipase     Studies/Results: No results found.  Medications: . acetaminophen  1,000 mg Oral TID  . antiseptic oral rinse  15 mL Mouth Rinse q12n4p  . apixaban  5 mg Oral BID  . atorvastatin  40 mg Oral QODAY  . carvedilol  3.125 mg Oral BID WC  . ferrous sulfate  325 mg Oral BID  . furosemide  40 mg Oral Daily  . hydrALAZINE  50 mg Oral Q8H  . insulin aspart  0-9 Units Subcutaneous Q6H  . insulin glargine  30 Units Subcutaneous QHS  . lip balm  1 application Topical BID  . loperamide  2 mg  Oral BID  . octreotide  150 mcg Subcutaneous TID  . OxyCODONE  15 mg Oral Q12H  . potassium chloride  40 mEq Oral Daily  . saccharomyces boulardii  250 mg Oral BID  . sodium chloride  3 mL Intravenous Q12H  . zolpidem  10 mg Oral QHS    Assessment/Plan Infected prosthetic mesh of abdominal wall with GIANT abscess s/p removal 08/14/2013 Dr. 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  Diabetes mellitus, insulin dependent (IDDM), uncontrolled  HYPERLIPIDEMIA  Obesity, Class III, BMI 40-49.9 (morbid obesity)  OBSTRUCTIVE SLEEP APNEA  Acute renal insufficiency from fistula/wound loss-improved  Deconditioning  Plan:  Continue TNA/bowel rest/suction/Eakins.     LOS: 33 days    Yanely Mast 09/13/2013

## 2013-09-13 NOTE — Progress Notes (Signed)
CSW continues to follow in case patient's dispostion status changes.  Juliah Scadden C. Haralson MSW, [place]

## 2013-09-14 LAB — GLUCOSE, CAPILLARY
Glucose-Capillary: 138 mg/dL — ABNORMAL HIGH (ref 70–99)
Glucose-Capillary: 172 mg/dL — ABNORMAL HIGH (ref 70–99)
Glucose-Capillary: 165 mg/dL — ABNORMAL HIGH (ref 70–99)

## 2013-09-14 MED ORDER — INSULIN ASPART 100 UNIT/ML ~~LOC~~ SOLN
0.0000 [IU] | SUBCUTANEOUS | Status: DC
  Administered 2013-09-15: 01:00:00 via SUBCUTANEOUS
  Administered 2013-09-15 – 2013-09-16 (×8): 1 [IU] via SUBCUTANEOUS
  Administered 2013-09-16 (×2): 2 [IU] via SUBCUTANEOUS
  Administered 2013-09-17 (×5): 1 [IU] via SUBCUTANEOUS
  Administered 2013-09-18: 01:00:00 2 [IU] via SUBCUTANEOUS
  Administered 2013-09-18 – 2013-09-19 (×3): 1 [IU] via SUBCUTANEOUS
  Administered 2013-09-20: 2 [IU] via SUBCUTANEOUS
  Administered 2013-09-20 – 2013-09-21 (×5): 1 [IU] via SUBCUTANEOUS
  Administered 2013-09-21 – 2013-09-22 (×2): 2 [IU] via SUBCUTANEOUS
  Administered 2013-09-22 (×2): 1 [IU] via SUBCUTANEOUS
  Administered 2013-09-22 – 2013-09-23 (×3): 2 [IU] via SUBCUTANEOUS
  Administered 2013-09-23: 05:00:00 1 [IU] via SUBCUTANEOUS
  Administered 2013-09-23 (×2): 2 [IU] via SUBCUTANEOUS
  Administered 2013-09-23 (×2): 1 [IU] via SUBCUTANEOUS
  Administered 2013-09-24 (×5): 2 [IU] via SUBCUTANEOUS
  Administered 2013-09-24: 12:00:00 1 [IU] via SUBCUTANEOUS
  Administered 2013-09-25 (×4): 2 [IU] via SUBCUTANEOUS
  Administered 2013-09-25: 1 [IU] via SUBCUTANEOUS
  Administered 2013-09-25: 20:00:00 2 [IU] via SUBCUTANEOUS
  Administered 2013-09-26 (×2): 1 [IU] via SUBCUTANEOUS
  Administered 2013-09-26: 2 [IU] via SUBCUTANEOUS
  Administered 2013-09-26: 1 [IU] via SUBCUTANEOUS
  Administered 2013-09-26 (×2): 2 [IU] via SUBCUTANEOUS
  Administered 2013-09-27 (×5): 1 [IU] via SUBCUTANEOUS
  Administered 2013-09-28 (×2): 2 [IU] via SUBCUTANEOUS
  Administered 2013-09-28: 04:00:00 1 [IU] via SUBCUTANEOUS
  Administered 2013-09-28: 2 [IU] via SUBCUTANEOUS
  Administered 2013-09-28: 1 [IU] via SUBCUTANEOUS
  Administered 2013-09-29: 2 [IU] via SUBCUTANEOUS
  Administered 2013-09-29: 21:00:00 1 [IU] via SUBCUTANEOUS
  Administered 2013-09-29 – 2013-10-01 (×11): 2 [IU] via SUBCUTANEOUS
  Administered 2013-10-01: 1 [IU] via SUBCUTANEOUS
  Administered 2013-10-01 (×2): 2 [IU] via SUBCUTANEOUS
  Administered 2013-10-01: 1 [IU] via SUBCUTANEOUS
  Administered 2013-10-01 – 2013-10-02 (×2): 2 [IU] via SUBCUTANEOUS
  Administered 2013-10-02 (×2): 1 [IU] via SUBCUTANEOUS
  Administered 2013-10-02 (×2): 2 [IU] via SUBCUTANEOUS
  Administered 2013-10-03 (×5): 1 [IU] via SUBCUTANEOUS
  Administered 2013-10-03: 2 [IU] via SUBCUTANEOUS
  Administered 2013-10-04: 1 [IU] via SUBCUTANEOUS
  Administered 2013-10-04 (×2): 2 [IU] via SUBCUTANEOUS
  Administered 2013-10-04 (×2): 1 [IU] via SUBCUTANEOUS
  Administered 2013-10-05 (×2): 2 [IU] via SUBCUTANEOUS
  Administered 2013-10-05: 1 [IU] via SUBCUTANEOUS

## 2013-09-14 MED ORDER — INSULIN GLARGINE 100 UNIT/ML ~~LOC~~ SOLN
35.0000 [IU] | Freq: Every day | SUBCUTANEOUS | Status: DC
  Administered 2013-09-15 – 2013-09-18 (×5): 35 [IU] via SUBCUTANEOUS
  Filled 2013-09-14 (×5): qty 0.35

## 2013-09-14 MED ORDER — ISOSORBIDE MONONITRATE 15 MG HALF TABLET
15.0000 mg | ORAL_TABLET | Freq: Every day | ORAL | Status: DC
  Administered 2013-09-14 – 2013-10-03 (×20): 15 mg via ORAL
  Filled 2013-09-14 (×21): qty 1

## 2013-09-14 MED ORDER — TRACE MINERALS CR-CU-F-FE-I-MN-MO-SE-ZN IV SOLN
INTRAVENOUS | Status: AC
  Administered 2013-09-14: 17:00:00 via INTRAVENOUS
  Filled 2013-09-14: qty 3000

## 2013-09-14 MED ORDER — FAT EMULSION 20 % IV EMUL
250.0000 mL | INTRAVENOUS | Status: AC
  Administered 2013-09-14: 250 mL via INTRAVENOUS
  Filled 2013-09-14: qty 250

## 2013-09-14 NOTE — Progress Notes (Addendum)
[facility] PROGRESS NOTE  ANNA GULDNER [id number] DOB: [birth year] DOA: 08/11/2013 PCP: [name], MD  Kurt Cross is a 62 y.o. Male with known history of chronic systolic heart failure status post defibrillator placement last EF measured was in 2013 was 35-40%, on home oxygen, OSA noncompliant with CPAP, diabetes mellitus, hypertension and hyperlipidemia presented to the ER because right leg pain.  He had been hospitalized a week prior with sepsis that was presumed to be due to UTI and discharged home.  In the ER, he was found to have a leukocytosis and AKI.  Abdominal CT demonstrated infected prosthetic Gore-Tex mesh.  His mesh was removed and 3L of pus were drained.  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  He also has chronic a-flutter which is rate controlled.  He is on anticoagulation.  See below for details.    Infected prosthetic Gore-Tex mesh with abdominal wall abscess: The patient was admitted to and was evaluated for underlying infection. CT scan of abdomen and pelvis demonstrated abscess formation around the patient's previously implanted Gore-Tex mesh. General surgery was consult today the patient underwent infection and drainage with removal of 3 L of purulent material on November 8. Blood cultures around the time of surgery demonstrated no growth final. Postoperatively, the patient developed an enterocutaneous fistula. He was started on Sandostatin on November 17 and the dose was titrated up. His fistular outpouring initially was very liquidy and ileus, however it has changed to a more thick consistency and now appears more like stool. He was allowed ice chips by mouth, but otherwise is n.p.o. He was started on TPN which should be continued indefinitely. He has an ostomy bag placed over his enterocutaneous fistula which is connected to continuous suction. This also should be continued through the foreseeable  future. Due to his continuous infusion of TPN and his need for continuous wall suction, he was declined by skilled nursing facilities. He would be a good candidate for LTAC as they can easily accommodate these requirements.   Sepsis was due to his abdominal abscess and infected Gore-Tex mesh. His wound cultures grew coag negative staph. He was placed on vancomycin and Zosyn and continued dual therapy from around the time of admission until November 25, a 2 week course at the recommendation of infectious disease. The patient does have an ICD placed, however because blood cultures are negative, it was felt that ICD infection with less likely. There is no evidence of vegetation on his transthoracic echocardiogram. Since cessation of antibiotics, he has had stable white blood cell count and no residual fevers.   Hypertension, with systolic blood pressures in the 180-190 range. His lasix and coreg were reduced because of volume and blood pressure issues. His lisinopril was stopped due to AKI. He was started on hydralazine which was converted to oral hydralazine and his blood pressures have trended down somewhat. He will still need some further titration of his blood pressure medications.  Atrial fibrillation with atrial flutter, currently rate controlled. His cardiac enzymes were negative x3. His echocardiogram demonstrated an ejection fraction of 55%. He was seen by cardiology who declined cardioversion because he maintained stable rate despite flutter.   Chronic systolic and diastolic heart failure. His initial chest x-Smolenski demonstrate volume overload. He is normally on lasix 80mg  BID. He was started on IV Lasix and his respiratory status improved. Had an echocardiogram on November 6 which demonstrated ejection fraction of 50-55% which is improved  from an ejection fraction of 35-40% proximally one year ago. He had weights up to the 350s, however his weight has gradually trended down to 330 pounds on lasix 40mg   daily. He may need some further adjustments to his lasix if his weight increases.    Diabetes mellitus type 2. While cycling his TPN, the patient repeatedly became hypoglycemic despite drastically reduced doses of Lantus. He should remain on continuous TPN for now. Lantus now 35 units.   Assessment/Plan  Infected prosthetic gore-tex mesh with large abscess s/p I&D and removal of gore-tex on 11/8.  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 -  Continue sandostatin and TNA for the foreseeable future -  Continuous wall suction for the foreseeable future  Sepsis due to coag neg staph s/p 2 weeks of vancomycin, resolved.  HTN blood pressure stable to mildly elevated - cont coreg and lasix -  Continue hydralazine -  Add imdur  #4 atrial fibrillation/atrial flutter persistently rate controlled.  PVC.   - secondary to problem #1. Now ressolved, CE x 3 neg, 2 D ECHO EF 55%  - currently rate controlled on Coreg  - CHADSVASc score is 4 (not ASVD plaque on abd aorta), resumed eliquis  #5 chronic kidney disease stage III, stable  -  Minimize nephrotoxins -  Renally dose medication  #6 leukocytosis/fever resolved.    #7 chronic combined systolic and diastolic CHF, weight stable around 150 kg or 330lbs - Continue daily lasix - on BB -  Not on ACEI at this time but is on hydralazine and adding imdur today  OSA, stable.  Continue CPAP.  #8 Uncontrolled Pain  - continue prn oxycodone as needed   #9 hyperlipidemia  - Continue statin.   #10 4.5 cm ascending aortic aneurysm per CT scan of 08/01/2013  - Stable. Asymptomatic. Continue risk factor modification with good blood pressure control.    #11 type 2 diabetes hypoglycemic when trying to cycle TPN 12/2.  Hypoglycemic 12/7 because TPN was off.   -  Increase lantus to 35 units  -  Continue SSI low dose  #12. Hypomagnesemia supplemented by TPN  Diet:  NPO with ice chips Access:  TL  PICC IVF:  TPN Proph:  apixaban  Code Status: full Family Communication: patient alone Disposition Plan: Patient is not a good candidate for SNF due to complexity of care, including need for continuous TPN and continuous wall suction.  These interventions can easily be managed by an LTACH.  The patient is otherwise stable and no longer meets criteria for hospitalization.     Antibiotics  Vancomycin 08/15/13>>> 08/31/13  Zosyn 08/14/13>>>08/31/13  Procedures/Studies:  Ct Abdomen Pelvis Wo Contrast 08/14/2013 14.5 x 18.1 x 26.1 cm fluid collection/abscess within the anterior abdominal wall, as described above, incompletely visualized. The collection appears just anterior to prior ventral hernia mesh. Given the size and proximity to the skin surface, incision and drainage is suggested.  Dg Chest 2 View 08/01/2013 No active cardiopulmonary disease.  Dg Hip Complete Right 08/11/2013 No acute osseous injury of the right hip.  Ct Angio Chest Pe W/cm &/or Wo Cm 08/01/2013 4.5 cm AAA without complicating features. No evidence of acute pulmonary embolism or aortic dissection.  Dg Chest Port 1 View 08/18/2013 Right upper extremity PICC terminates in the mid superior vena cava. Stable congestive heart failure pattern.  Dg Chest Port 1 View 08/17/2013 Stable chest with changes of congestive heart failure with pulmonary interstitial edema. Cardiac pacer in stable position.  Dg Chest Portable 1 View 08/15/2013 Stable cardiomegaly with interval worsening of pulmonary vascular congestion/ edema relative to 08/13/2013.  Dg Chest Port 1 View 08/13/2013 No acute findings. Stable cardiomegaly and pulmonary venous prominence without overt edema.  2-D echo 08/12/2013  Incision and drainage of abscess, Gore-Tex mesh removal, debridement of abdominal wall and peritoneum Consultants:  Cardiology: Dr. Terrence Dupont 08/13/2013  General surgery: Dr. Johney Maine 08/14/2013  ID: Dr Megan Salon 08/15/13  HPI/Subjective:  Doing well.   Ostomy bag with formed stool and some blood.  Weight approximately stable  Objective: Filed Vitals:   09/13/13 2006 09/13/13 2250 09/14/13 0441 09/14/13 0800  BP: 139/66  136/55   Pulse: 59 63 59 74  Temp: 99 F (37.2 C)  97.5 F (36.4 C)   TempSrc: Oral  Axillary   Resp: 16 18 20    Height:      Weight:   150.5 kg (331 lb 12.7 oz)   SpO2: 98% 97% 100%     Intake/Output Summary (Last 24 hours) at 09/14/13 1101 Last data filed at 09/14/13 0442  Gross per 24 hour  Intake   2295 ml  Output   1295 ml  Net   1000 ml   Filed Weights   09/12/13 0506 09/13/13 0500 09/14/13 0441  Weight: 150 kg (330 lb 11 oz) 149 kg (328 lb 7.8 oz) 150.5 kg (331 lb 12.7 oz)    Exam:   General:  [name], No acute distress, stable from prior  HEENT:  NCAT, MMM  Cardiovascular:  RRR, nl S1, S2 no mrg, 2+ pulses, warm extremities  Respiratory:  CTAB, no increased WOB  Abdomen:   NABS, soft, NT/ND, clear ostomy dressing in place with formed stool right abdomen and bloody fluid surrounding  MSK:   Normal tone and bulk, no LEE  Neuro:  Grossly intact  Data Reviewed: Basic Metabolic Panel:  Recent Labs Lab 09/09/13 0446 09/13/13 0402  NA 133* 132*  K 4.0 4.2  CL 99 97  CO2 27 29  GLUCOSE 136* 173*  BUN 25* 25*  CREATININE 1.03 1.03  CALCIUM 8.8 8.7  MG 1.8 1.8  PHOS 4.5 4.2   Liver Function Tests:  Recent Labs Lab 09/09/13 0446 09/13/13 0402  AST 30 53*  ALT 28 47  ALKPHOS 65 75  BILITOT 1.4* 1.5*  PROT 7.1 6.9  ALBUMIN 2.0* 1.9*   No results found for this basename: LIPASE, AMYLASE,  in the last 168 hours No results found for this basename: AMMONIA,  in the last 168 hours CBC:  Recent Labs Lab 09/09/13 0446 09/10/13 0601 09/11/13 0525 09/12/13 0500 09/13/13 0402  WBC 7.8 7.9 8.8 9.7 7.9  NEUTROABS  --   --   --   --  3.7  HGB 11.2* 11.0* 10.8* 10.7* 10.1*  HCT 32.6* 32.7* 31.8* 31.4* 29.6*  MCV 93.4 94.2 93.8 93.2 93.1  PLT 307 324 326 330 305   Cardiac  Enzymes: No results found for this basename: CKTOTAL, CKMB, CKMBINDEX, TROPONINI,  in the last 168 hours BNP (last 3 results)  Recent Labs  08/01/13 1649  PROBNP 481.4*   CBG:  Recent Labs Lab 09/13/13 1606 09/13/13 2007 09/14/13 0016 09/14/13 0437 09/14/13 0809  GLUCAP 168* 160* 159* 138* 158*    No results found for this or any previous visit (from the past 240 hour(s)).   Studies: No results found.  Scheduled Meds: . acetaminophen  1,000 mg Oral TID  . antiseptic oral rinse  15 mL Mouth  Rinse q12n4p  . apixaban  5 mg Oral BID  . atorvastatin  40 mg Oral QODAY  . carvedilol  3.125 mg Oral BID WC  . ferrous sulfate  325 mg Oral BID  . furosemide  40 mg Oral Daily  . hydrALAZINE  50 mg Oral Q8H  . insulin aspart  0-9 Units Subcutaneous Q6H  . insulin glargine  35 Units Subcutaneous QHS  . lip balm  1 application Topical BID  . octreotide  150 mcg Subcutaneous TID  . OxyCODONE  15 mg Oral Q12H  . potassium chloride  40 mEq Oral Daily  . saccharomyces boulardii  250 mg Oral BID  . sodium chloride  3 mL Intravenous Q12H  . zolpidem  10 mg Oral QHS   Continuous Infusions: . sodium chloride 20 mL/hr (09/14/13 0406)  . [name] KitchenTPN (CLINIMIX-E) Adult 125 mL/hr at 09/13/13 1748   And  . fat emulsion 250 mL (09/13/13 1748)    Principal Problem:   Entero-atmospheric fistula Active Problems:   Diabetes mellitus, insulin dependent (IDDM), uncontrolled   HYPERLIPIDEMIA   Obesity, Class III, BMI 40-49.9 (morbid obesity)   OBSTRUCTIVE SLEEP APNEA   Hypertension   Acute-on-chronic respiratory failure secondary to probable community-acquired pneumonia   Fever   Sepsis   Constipation, chronic   Chronic combined systolic and diastolic CHF (congestive heart failure)   Right leg pain   Leukocytosis   AAA (abdominal aortic aneurysm)/ 4.5 cm ascending per CT angio 08/01/13   Infected prosthetic mesh of abdominal wall with GIANT abscess s/p removal 08/14/2013   Recurrent ventral  incisional hernia   Acute renal failure (ARF)    Time spent: 30 min    Emanuela Runnion, [facility] Pager [phone]. If 7PM-7AM, please contact night-coverage at [email or web address], password [email or web address] 09/14/2013, 11:01 AM  LOS: 34 days

## 2013-09-14 NOTE — Progress Notes (Signed)
PARENTERAL NUTRITION CONSULT NOTE - FOLLOW UP  Pharmacy Consult for TNA Indication: Enterocutaneous fistula  No Known Allergies  Patient Measurements: Height: 6' (182.9 cm) Weight: 331 lb 12.7 oz (150.5 kg) IBW/kg (Calculated) : 77.6 Adjusted Body Weight: 102.6kg  Vital Signs: Temp: 97.5 F (36.4 C) (12/09 0441) Temp src: Axillary (12/09 0441) BP: 136/55 mmHg (12/09 0441) Pulse Rate: 74 (12/09 0800) Intake/Output from previous day: 12/08 0701 - 12/09 0700 In: 2295 [TPN:2295] Out: 1295 [id number]; Drains:120]  Labs:  Recent Labs  09/12/13 0500 09/13/13 0402  WBC 9.7 7.9  HGB 10.7* 10.1*  HCT 31.4* 29.6*  PLT 330 305    Recent Labs  09/13/13 0402 09/13/13 0428  NA 132*  --   K 4.2  --   CL 97  --   CO2 29  --   GLUCOSE 173*  --   BUN 25*  --   CREATININE 1.03  --   CALCIUM 8.7  --   MG 1.8  --   PHOS 4.2  --   PROT 6.9  --   ALBUMIN 1.9*  --   AST 53*  --   ALT 47  --   ALKPHOS 75  --   BILITOT 1.5*  --   PREALBUMIN 10.3*  --   TRIG  --  94    Recent Labs  09/14/13 0016 09/14/13 0437 09/14/13 0809  GLUCAP 159* 138* 158*   Insulin Requirements in the past 24 hours:   Lantus increased to 35 units qhs by MD (note Lantus requirements fluctuate recently with a few hypoglycemic events)   Required 7 units SSI  - CBG q4h with sensitive scale coverage  TNA contains regular insulin 40 units per 3L bag (13.3 units/L)  Nutritional Goals:   Re-estimated per RD 12/2:  2370-2650 kcal/day, 160-180 grams/day protein, 3.3-3.5 L/day fluid  Clinimix E5/15 at goal rate 125 ml/hr (3L/day) with 20% lipids at 10 ml/hr to provide 150g protein and 2675 kcal daily.  Due to how much fluid can be placed in TNA bag, maximum volume is 3L.   Current Nutrition:   NPO except for ice chips, sips with meds  Clinimix E5/15 at 15ml/hr and 20% lipids at 10 ml/hr over 24 hours  IVF: NS at [place]: 62 yo male with large abdominal abscess with infected old Gore  Tex mesh s/p I&D abscess, removal of mesh, debridement of abdominal wall and peritoneum. 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 TNA started 11/12 at 10pm.  Pt will likely be discharged eventually with TNA.   12/9: Pt continues 24 hour continuous TPN. CBGs elevated yesterday but MD increased Lantus dose for tonight. Will continue the same insulin in TNA today. Pt previously had hypoglycemic events on 12/2 after starting cyclic regimen -since pt w/ morbid obesity, walking only for P/T - there is little advantage in this case to be on cyclic TNA.  Awaiting plans for discharge - pt no longer meets criteria for hospitalization.   Labs:   Glucose: Hx DM on insulin PTA. CBGs improving. Md adjusting Lantus  Electrolytes: Na low (unable to adjust in premix TNA). K, Phos and Mag wnl.     Renal:  SCr stable/wnl. UOP = 0.3 ml/kg/hr  LFTs: AST trended up to 53.  Albumin only 1.9  TGs: wnl  Prealbumin: initially improved but trending down yesterday - 10.3 (11/13), 11.1 (11/17), 11.2(11/24), 13.6 (12/1), 10.3 (12/8)  TPN Access: Triple lumen PICC placed 11/12 TPN day#: 28  Plan:  Continue Clinimix E 5/15 at 125 ml/hr with lipids 20% at 10 ml/hr over 24 hours.     Continue insulin 40 units per 3L bag ( 13.3 units/L)  TNA to contain standard multivitamins and trace elements.  Continue CBG checks q4h with sensitive SSI coverage.  F/U closely and adjust insulin coverage as needed.    Lantus 35 units QHS per MD  KCl 58mEq PO daily per MD  Continue NS at [place] on Mondays & Thursdays  F/u discharge plans and TNA arrangement.  Vanessa Iola, PharmD, BCPS Pager: [phone] 11:46 AM Pharmacy #: [phone]

## 2013-09-14 NOTE — Progress Notes (Signed)
NUTRITION FOLLOW UP  Intervention:   Continue TPN per Pharmacy Diet advancement per MD discretion RD to continue monitor  Nutrition Dx:   Inadequate oral intake related to EC fistula as evidenced by NPO status; ongoing  Goal:   Pt to meet >/= 90% of their estimated nutrition needs; being met by TPN  Monitor:   TPN rate; clinamix E 5/15 @125  ml/hr 24 hours per day Weight; 13 lbs weight loss since admission, weight fairly stable since 11/20 Labs; Blood glucose ranging 131-203 mg/dL, low sodium, potassium WNL, low albumin, phosphorus and magnesium WNL; Triglycerides still WNL (12/8), low hemoglobin, low prealbumin trending down Diet advancement; none  Assessment:   62 y.o. male with known history of chronic systolic heart failure status post defibrillator placement last EF measured was in 2013 was 35-40%, on home oxygen, OSA noncompliant with CPAP, diabetes mellitus, hypertension and hyperlipidemia. CT scan revealed a large abdominal wall abscess.  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 TNA started 11/12 at 10pm.  TPN was changed back to 24 hours. Clinimix E5/15 at goal rate 125 ml/hr (3L/day) with 20% lipids at 10 ml/hr to provide 150g protein and 2675 kcal daily. This meets 94% of estimated protein needs and 101% of estimated energy needs. Pt continues to lose weight but, slowly-2 lb weight loss in past 2 weeks. Per chart pt will likely be discharged with TNA.  RD will continue to monitor weight trends and re-estimate energy needs as needed.   Height: Ht Readings from Last 1 Encounters:  08/14/13 6' (1.829 m)    Weight Status:   Wt Readings from Last 1 Encounters:  09/14/13 331 lb 12.7 oz (150.5 kg)    Re-estimated needs:  Kcal: 2370-2650 Protein: 160-180 grams Fluid: 3.3-3.5 L/day  Skin: +1 Generalized edema, non-pitting RLE and LLE edema; abdominal incisions/wounds with closed system drains  Diet Order: NPO   Intake/Output Summary (Last 24 hours) at 09/14/13  1432 Last data filed at 09/14/13 0442  Gross per 24 hour  Intake   2295 ml  Output    620 ml  Net   1675 ml    Last BM: 11/26   Labs:   Recent Labs Lab 09/09/13 0446 09/13/13 0402  NA 133* 132*  K 4.0 4.2  CL 99 97  CO2 27 29  BUN 25* 25*  CREATININE 1.03 1.03  CALCIUM 8.8 8.7  MG 1.8 1.8  PHOS 4.5 4.2  GLUCOSE 136* 173*    CBG (last 3)   Recent Labs  09/14/13 0437 09/14/13 0809 09/14/13 1156  GLUCAP 138* 158* 172*    Scheduled Meds: . acetaminophen  1,000 mg Oral TID  . antiseptic oral rinse  15 mL Mouth Rinse q12n4p  . apixaban  5 mg Oral BID  . atorvastatin  40 mg Oral QODAY  . carvedilol  3.125 mg Oral BID WC  . ferrous sulfate  325 mg Oral BID  . furosemide  40 mg Oral Daily  . hydrALAZINE  50 mg Oral Q8H  . insulin aspart  0-9 Units Subcutaneous Q6H  . insulin glargine  35 Units Subcutaneous QHS  . isosorbide mononitrate  15 mg Oral Daily  . lip balm  1 application Topical BID  . octreotide  150 mcg Subcutaneous TID  . OxyCODONE  15 mg Oral Q12H  . potassium chloride  40 mEq Oral Daily  . saccharomyces boulardii  250 mg Oral BID  . sodium chloride  3 mL Intravenous Q12H  . zolpidem  10 mg  Oral QHS    Continuous Infusions: . sodium chloride 20 mL/hr (09/14/13 0406)  . [name] KitchenTPN (CLINIMIX-E) Adult 125 mL/hr at 09/13/13 1748   And  . fat emulsion 250 mL (09/13/13 1748)  . [name] KitchenTPN (CLINIMIX-E) Adult     And  . fat emulsion      Pryor Ochoa RD, LDN Inpatient Clinical Dietitian Pager: [phone] After Hours Pager: [phone]

## 2013-09-15 LAB — GLUCOSE, CAPILLARY
Glucose-Capillary: 116 mg/dL — ABNORMAL HIGH (ref 70–99)
Glucose-Capillary: 142 mg/dL — ABNORMAL HIGH (ref 70–99)
Glucose-Capillary: 147 mg/dL — ABNORMAL HIGH (ref 70–99)
Glucose-Capillary: 168 mg/dL — ABNORMAL HIGH (ref 70–99)

## 2013-09-15 MED ORDER — TRACE MINERALS CR-CU-F-FE-I-MN-MO-SE-ZN IV SOLN
INTRAVENOUS | Status: AC
  Administered 2013-09-15: 17:00:00 via INTRAVENOUS
  Filled 2013-09-15: qty 3000

## 2013-09-15 MED ORDER — FAT EMULSION 20 % IV EMUL
250.0000 mL | INTRAVENOUS | Status: AC
  Administered 2013-09-15: 250 mL via INTRAVENOUS
  Filled 2013-09-15: qty 250

## 2013-09-15 NOTE — Progress Notes (Signed)
Patient ID: Kurt Cross, male   DOB: [birth year], 62 y.o.   MRN: [id number] [facility] PROGRESS NOTE  Kurt Cross [id number] DOB: August 19, 1951 DOA: 08/11/2013 PCP: [name], MD  Pt is 62 y.o. Male with known history of chronic systolic heart failure status post defibrillator placement last EF measured was in 2013 was 35-40%, on home oxygen, OSA noncompliant with CPAP, diabetes mellitus, hypertension and hyperlipidemia presented to the ER because right leg pain. He had been hospitalized a week prior with sepsis that was presumed to be due to UTI and discharged home. In the ER, he was found to have a leukocytosis and AKI. Abdominal CT demonstrated infected prosthetic Gore-Tex mesh. His mesh was removed and 3L of pus was drained. 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 He also has chronic a-flutter which is rate controlled. He is on anticoagulation. See below for details.   Assessment/Plan  Infected prosthetic gore-tex mesh  - with large abscess s/p I&D and removal of gore-tex on 11/8. 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  - Continue sandostatin and TNA for the foreseeable future  - Continuous wall suction for the foreseeable future  Sepsis  - due to coag neg staph s/p 2 weeks of vancomycin, resolved.  HTN  - blood pressure stable  - cont coreg and lasix  - Continue hydralazine  - Add imdur  #4 atrial fibrillation/atrial flutter  - secondary to problem #1. Now ressolved, CE x 3 neg, 2 D ECHO EF 55%  - currently rate controlled on Coreg  - CHADSVASc score is 4 (not ASVD plaque on abd aorta), resumed eliquis  #5 chronic kidney disease stage III - stable  - Minimize nephrotoxins  - Renally dose medication  #6 leukocytosis/fever  - resolved.  #7 chronic combined systolic and diastolic CHF - weight stable around 150 kg or 330lbs  - Continue daily lasix  - on  BB  - Not on ACEI at this time but is on hydralazine, imdur OSA,  - stable. Continue CPAP.  #8 Uncontrolled Pain  - continue prn oxycodone as needed  #9 hyperlipidemia  - Continue statin.  #10 4.5 cm ascending aortic aneurysm per CT scan of 08/01/2013  - Stable. Asymptomatic. Continue risk factor modification with good blood pressure control.  #11 type 2 diabetes  - hypoglycemic when trying to cycle TPN 12/2. Hypoglycemic 12/7 because TPN was off.  - Increase lantus to 35 units  - Continue SSI low dose  #12. Hypomagnesemia  - supplemented by TPN   Diet: NPO with ice chips  Access: TL PICC  IVF: TPN  Proph: apixaban   Code Status: full  Family Communication: patient alone  Disposition Plan: Patient is not a good candidate for SNF due to complexity of care, including need for continuous TPN and continuous wall suction. These interventions can easily be managed by an LTACH. The patient is otherwise stable and no longer meets criteria for hospitalization.   Antibiotics  Vancomycin 08/15/13>>> 08/31/13  Zosyn 08/14/13>>>08/31/13  Procedures/Studies:  Ct Abdomen Pelvis Wo Contrast 08/14/2013 14.5 x 18.1 x 26.1 cm fluid collection/abscess within the anterior abdominal wall, as described above, incompletely visualized. The collection appears just anterior to prior ventral hernia mesh. Given the size and proximity to the skin surface, incision and drainage is suggested.  Dg Chest 2 View 08/01/2013 No active cardiopulmonary disease.  Dg Hip Complete Right 08/11/2013 No acute osseous injury of the right hip.  Ct Angio Chest Pe W/cm &/or Wo Cm 08/01/2013 4.5 cm AAA without complicating features. No evidence of acute pulmonary embolism or aortic dissection.  Dg Chest Port 1 View 08/18/2013 Right upper extremity PICC terminates in the mid superior vena cava. Stable congestive heart failure pattern.  Dg Chest Port 1 View 08/17/2013 Stable chest with changes of congestive heart failure with pulmonary  interstitial edema. Cardiac pacer in stable position.  Dg Chest Portable 1 View 08/15/2013 Stable cardiomegaly with interval worsening of pulmonary vascular congestion/ edema relative to 08/13/2013.  Dg Chest Port 1 View 08/13/2013 No acute findings. Stable cardiomegaly and pulmonary venous prominence without overt edema.  2-D echo 08/12/2013  Incision and drainage of abscess, Gore-Tex mesh removal, debridement of abdominal wall and peritoneum Consultants:  Cardiology: Dr. Terrence Dupont 08/13/2013  General surgery: Dr. Johney Maine 08/14/2013  ID: Dr Megan Salon 08/15/13  HPI/Subjective: No events overnight.   Objective: Filed Vitals:   09/14/13 2013 09/14/13 2316 09/15/13 0455 09/15/13 1403  BP: 138/60  138/72 114/55  Pulse: 65 68 78 62  Temp: 98.7 F (37.1 C)  98.7 F (37.1 C) 98.3 F (36.8 C)  TempSrc: Oral  Oral Oral  Resp: 20 20 20 20   Height:      Weight:      SpO2: 97% 96% 98% 99%    Intake/Output Summary (Last 24 hours) at 09/15/13 1832 Last data filed at 09/15/13 1600  Gross per 24 hour  Intake      0 ml  Output   1250 ml  Net  -1250 ml    Exam:   General:  Pt is alert, follows commands appropriately, not in acute distress  Cardiovascular: Regular rate and rhythm, S1/S2, no murmurs, no rubs, no gallops  Respiratory: Clear to auscultation bilaterally, no wheezing, no crackles, no rhonchi  Abdomen: Soft, non tender, non distended, no guarding  Neuro: Grossly nonfocal  Data Reviewed: Basic Metabolic Panel:  Recent Labs Lab 09/09/13 0446 09/13/13 0402  NA 133* 132*  K 4.0 4.2  CL 99 97  CO2 27 29  GLUCOSE 136* 173*  BUN 25* 25*  CREATININE 1.03 1.03  CALCIUM 8.8 8.7  MG 1.8 1.8  PHOS 4.5 4.2   Liver Function Tests:  Recent Labs Lab 09/09/13 0446 09/13/13 0402  AST 30 53*  ALT 28 47  ALKPHOS 65 75  BILITOT 1.4* 1.5*  PROT 7.1 6.9  ALBUMIN 2.0* 1.9*   CBC:  Recent Labs Lab 09/09/13 0446 09/10/13 0601 09/11/13 0525 09/12/13 0500 09/13/13 0402   WBC 7.8 7.9 8.8 9.7 7.9  NEUTROABS  --   --   --   --  3.7  HGB 11.2* 11.0* 10.8* 10.7* 10.1*  HCT 32.6* 32.7* 31.8* 31.4* 29.6*  MCV 93.4 94.2 93.8 93.2 93.1  PLT 307 324 326 330 305   CBG:  Recent Labs Lab 09/15/13 0026 09/15/13 0431 09/15/13 0758 09/15/13 1155 09/15/13 1707  GLUCAP 168* 147* 116* 142* 139*   Scheduled Meds: . acetaminophen  1,000 mg Oral TID  . antiseptic oral rinse  15 mL Mouth Rinse q12n4p  . apixaban  5 mg Oral BID  . atorvastatin  40 mg Oral QODAY  . carvedilol  3.125 mg Oral BID WC  . ferrous sulfate  325 mg Oral BID  . furosemide  40 mg Oral Daily  . hydrALAZINE  50 mg Oral Q8H  . insulin aspart  0-9 Units Subcutaneous Q4H  . insulin glargine  35 Units Subcutaneous QHS  . isosorbide mononitrate  15 mg Oral Daily  . lip balm  1 application Topical BID  . octreotide  150 mcg Subcutaneous TID  . OxyCODONE  15 mg Oral Q12H  . potassium chloride  40 mEq Oral Daily  . saccharomyces boulardii  250 mg Oral BID  . sodium chloride  3 mL Intravenous Q12H  . zolpidem  10 mg Oral QHS   Continuous Infusions: . sodium chloride 20 mL/hr (09/14/13 0406)  . [name] KitchenTPN (CLINIMIX-E) Adult 125 mL/hr at 09/15/13 1711   And  . fat emulsion 250 mL (09/15/13 1712)   Faye Ramsay, MD  [facility] Pager [phone]  If 7PM-7AM, please contact night-coverage [email or web address] Password [facility] 09/15/2013, 6:32 PM   LOS: 35 days

## 2013-09-15 NOTE — Progress Notes (Signed)
PARENTERAL NUTRITION CONSULT NOTE - FOLLOW UP  Pharmacy Consult for TNA Indication: Enterocutaneous fistula  No Known Allergies  Patient Measurements: Height: 6' (182.9 cm) Weight: 331 lb 12.7 oz (150.5 kg) IBW/kg (Calculated) : 77.6 Adjusted Body Weight: 102.6kg  Vital Signs: Temp: 98.7 F (37.1 C) (12/10 0455) Temp src: Oral (12/10 0455) BP: 138/72 mmHg (12/10 0455) Pulse Rate: 78 (12/10 0455) Intake/Output from previous day: 12/09 0701 - 12/10 0700 In: 4451.4 [I.V.:1375.7; TPN:3075.8] Out: 2025 [Urine:1550; Drains:475]  Labs:  Recent Labs  09/13/13 0402  WBC 7.9  HGB 10.1*  HCT 29.6*  PLT 305    Recent Labs  09/13/13 0402 09/13/13 0428  NA 132*  --   K 4.2  --   CL 97  --   CO2 29  --   GLUCOSE 173*  --   BUN 25*  --   CREATININE 1.03  --   CALCIUM 8.7  --   MG 1.8  --   PHOS 4.2  --   PROT 6.9  --   ALBUMIN 1.9*  --   AST 53*  --   ALT 47  --   ALKPHOS 75  --   BILITOT 1.5*  --   PREALBUMIN 10.3*  --   TRIG  --  94    Recent Labs  09/15/13 0026 09/15/13 0431 09/15/13 0758  GLUCAP 168* 147* 116*   Insulin Requirements in the past 24 hours:   Required 6 units SSI  - CBG q4h with sensitive scale coverage  Lantus now 35 units qhs by MD (note Lantus requirements fluctuate recently with a few hypoglycemic events)   TNA contains regular insulin 40 units per 3L bag (13.3 units/L)  Nutritional Goals:   Recommended by RD 12/9:  [id number] kcal/day, 160-180 grams/day protein, 3.3-3.5 L/day fluid  Clinimix E5/15 at goal rate 125 ml/hr (3L/day) with 20% lipids at 10 ml/hr to provide 150g protein and 2675 kcal daily.  Due to how much fluid can be placed in TNA bag, maximum volume is 3L.   Current Nutrition:   NPO except for ice chips, sips with meds  Clinimix E5/15 at 11ml/hr and 20% lipids at 10 ml/hr over 24 hours  IVF: NS at [place]: 62 yo male with large abdominal abscess with infected old Gore Tex mesh s/p I&D abscess,  removal of mesh, debridement of abdominal wall and peritoneum. 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 TNA started 11/12 at 10pm.  Pt will likely be discharged eventually with TNA.   12/10: Pt continues 24 hour continuous TPN. CBGs better with increased Lantus dose. Pt previously had hypoglycemic events on 12/2 after starting cyclic regimen -since pt w/ morbid obesity, walking only for P/T - there is little advantage in this case to be on cyclic TNA. Per nursing staff, although NPO - pt/family has been sneaking in food. Education provided to patient, pt expressed understanding.  Awaiting LTAC facility, pt will be discharged on TNA - pt no longer meets criteria for hospitalization.   Labs:   Glucose: Hx DM on insulin PTA. CBGs controlled.   Electrolytes: Na low (unable to adjust in premix TNA). K, Phos and Mag wnl.     Renal:  SCr stable/wnl. UOP = 0.4 ml/kg/hr  LFTs: AST trended up to 53.  Albumin only 1.9  TGs: wnl  Prealbumin: initially improved but trending down yesterday - 10.3 (11/13), 11.1 (11/17), 11.2(11/24), 13.6 (12/1), 10.3 (12/8)  TPN Access: Triple lumen PICC placed 11/12 TPN day#: 29  Plan:   Continue Clinimix E 5/15 at 125 ml/hr with lipids 20% at 10 ml/hr over 24 hours.     Continue insulin 40 units per 3L bag ( 13.3 units/L)  TNA to contain standard multivitamins and trace elements.  Continue CBG checks q4h with sensitive SSI coverage.  F/U closely and adjust insulin coverage as needed.    Lantus 35 units QHS per MD  KCl 63mEq PO daily per MD  Continue NS at [facility] on Mondays & Thursdays  F/u discharge plans and TNA arrangement.  Vanessa Trenton, PharmD, BCPS Pager: [phone] 9:56 AM Pharmacy #: [phone]

## 2013-09-15 NOTE — Progress Notes (Signed)
Patient interviewed and examined, agree with PA note above.  Edward Jolly MD, FACS  09/15/2013 5:15 PM

## 2013-09-15 NOTE — Progress Notes (Signed)
32 Days Post-Op  Subjective: Up in chair, some drainage in bag today, No new changes.  Objective: Vital signs in last 24 hours: Temp:  [98.7 F (37.1 C)] 98.7 F (37.1 C) (12/10 0455) Pulse Rate:  [65-78] 78 (12/10 0455) Resp:  [18-20] 20 (12/10 0455) BP: (138-141)/(60-72) 138/72 mmHg (12/10 0455) SpO2:  [96 %-99 %] 98 % (12/10 0455) Last BM Date: 09/01/13 Afebrile, VSS 120 from drain recorded 12/8 475 recorded from drain yesterday. Prealbumin on 09/13/13  10.3 Intake/Output from previous day: 12/09 0701 - 12/10 0700 In: 4451.4 [I.V.:1375.7; TPN:3075.8] Out: 2025 [Urine:1550; Drains:475] Intake/Output this shift:    General appearance: alert, cooperative and no distress GI: soft, non-tender; bowel sounds normal; no masses,  no organomegaly and Eakins pouch with drainge, green, some of it looks like leafy vegatative matter.  Lab Results:   Recent Labs  09/13/13 0402  WBC 7.9  HGB 10.1*  HCT 29.6*  PLT 305    BMET  Recent Labs  09/13/13 0402  NA 132*  K 4.2  CL 97  CO2 29  GLUCOSE 173*  BUN 25*  CREATININE 1.03  CALCIUM 8.7   PT/INR No results found for this basename: LABPROT, INR,  in the last 72 hours   Recent Labs Lab 09/09/13 0446 09/13/13 0402  AST 30 53*  ALT 28 47  ALKPHOS 65 75  BILITOT 1.4* 1.5*  PROT 7.1 6.9  ALBUMIN 2.0* 1.9*     Lipase  No results found for this basename: lipase     Studies/Results: No results found.  Medications: . acetaminophen  1,000 mg Oral TID  . antiseptic oral rinse  15 mL Mouth Rinse q12n4p  . apixaban  5 mg Oral BID  . atorvastatin  40 mg Oral QODAY  . carvedilol  3.125 mg Oral BID WC  . ferrous sulfate  325 mg Oral BID  . furosemide  40 mg Oral Daily  . hydrALAZINE  50 mg Oral Q8H  . insulin aspart  0-9 Units Subcutaneous Q4H  . insulin glargine  35 Units Subcutaneous QHS  . isosorbide mononitrate  15 mg Oral Daily  . lip balm  1 application Topical BID  . octreotide  150 mcg Subcutaneous TID   . OxyCODONE  15 mg Oral Q12H  . potassium chloride  40 mEq Oral Daily  . saccharomyces boulardii  250 mg Oral BID  . sodium chloride  3 mL Intravenous Q12H  . zolpidem  10 mg Oral QHS    Assessment/Plan Infected prosthetic mesh of abdominal wall with GIANT abscess s/p removal 08/14/2013 Dr. 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  Diabetes mellitus, insulin dependent (IDDM), uncontrolled  HYPERLIPIDEMIA  Obesity, Class III, BMI 40-49.9 (morbid obesity)  OBSTRUCTIVE SLEEP APNEA  Acute renal insufficiency from fistula/wound loss-improved  Deconditioning Post op day 32  Plan:  Continue TNA, Bowel rest, Eakins pouch.   LOS: 35 days    Kurt Cross 09/15/2013

## 2013-09-16 LAB — COMPREHENSIVE METABOLIC PANEL
Alkaline Phosphatase: 104 U/L (ref 39–117)
BUN: 23 mg/dL (ref 6–23)
CO2: 27 mEq/L (ref 19–32)
Chloride: 98 mEq/L (ref 96–112)
Creatinine, Ser: 1 mg/dL (ref 0.50–1.35)
GFR calc Af Amer: >90 mL/min (ref >90)
GFR calc non Af Amer: 80 mL/min — ABNORMAL LOW (ref >90)
Glucose, Bld: 120 mg/dL — ABNORMAL HIGH (ref 70–99)
Potassium: 4 mEq/L (ref 3.5–5.1)
Total Bilirubin: 1.1 mg/dL (ref 0.3–1.2)

## 2013-09-16 LAB — GLUCOSE, CAPILLARY
Glucose-Capillary: 123 mg/dL — ABNORMAL HIGH (ref 70–99)
Glucose-Capillary: 132 mg/dL — ABNORMAL HIGH (ref 70–99)
Glucose-Capillary: 145 mg/dL — ABNORMAL HIGH (ref 70–99)
Glucose-Capillary: 147 mg/dL — ABNORMAL HIGH (ref 70–99)
Glucose-Capillary: 154 mg/dL — ABNORMAL HIGH (ref 70–99)
Glucose-Capillary: 166 mg/dL — ABNORMAL HIGH (ref 70–99)

## 2013-09-16 LAB — CBC
HCT: 29.9 % — ABNORMAL LOW (ref 39.0–52.0)
Hemoglobin: 10.1 g/dL — ABNORMAL LOW (ref 13.0–17.0)
MCH: 31.1 pg (ref 26.0–34.0)
MCHC: 33.8 g/dL (ref 30.0–36.0)
MCV: 92 fL (ref 78.0–100.0)
Platelets: 339 10*3/uL (ref 150–400)
RBC: 3.25 MIL/uL — ABNORMAL LOW (ref 4.22–5.81)

## 2013-09-16 LAB — PHOSPHORUS: Phosphorus: 4.4 mg/dL (ref 2.3–4.6)

## 2013-09-16 LAB — MAGNESIUM: Magnesium: 1.7 mg/dL (ref 1.5–2.5)

## 2013-09-16 MED ORDER — TRACE MINERALS CR-CU-F-FE-I-MN-MO-SE-ZN IV SOLN
INTRAVENOUS | Status: AC
  Administered 2013-09-16: 17:00:00 via INTRAVENOUS
  Filled 2013-09-16: qty 3000

## 2013-09-16 MED ORDER — FAT EMULSION 20 % IV EMUL
250.0000 mL | INTRAVENOUS | Status: AC
  Administered 2013-09-16: 250 mL via INTRAVENOUS
  Filled 2013-09-16: qty 250

## 2013-09-16 NOTE — Progress Notes (Signed)
33 Days Post-Op  Subjective: No real change Objective: Vital signs in last 24 hours: Temp:  [97.3 F (36.3 C)-98.3 F (36.8 C)] 98.1 F (36.7 C) (12/11 0550) Pulse Rate:  [62-81] 75 (12/11 0550) Resp:  [12-22] 20 (12/11 0550) BP: (114-159)/(55-85) 159/85 mmHg (12/11 0550) SpO2:  [95 %-99 %] 96 % (12/11 0550) Weight:  [160.3 kg (353 lb 6.4 oz)] 160.3 kg (353 lb 6.4 oz) (12/11 0550) Last BM Date: 09/01/13 325 from the drainage bag recorded yesterday. BP up some Labs OK Intake/Output from previous day: 12/10 0701 - 12/11 0700 In: 2160 [TPN:2160] Out: 1300 [Urine:975; Drains:325] Intake/Output this shift:    General appearance: alert, cooperative and no distress GI: soft, non-tender; bowel sounds normal; no masses,  no organomegaly and Drainage green and thick at base of bag, mepitel is down in the base of the wound.also.  Lab Results:  No results found for this basename: WBC, HGB, HCT, PLT,  in the last 72 hours  BMET  Recent Labs  09/16/13 0334  NA 134*  K 4.0  CL 98  CO2 27  GLUCOSE 120*  BUN 23  CREATININE 1.00  CALCIUM 9.2   PT/INR No results found for this basename: LABPROT, INR,  in the last 72 hours   Recent Labs Lab 09/13/13 0402 09/16/13 0334  AST 53* 87*  ALT 47 90*  ALKPHOS 75 104  BILITOT 1.5* 1.1  PROT 6.9 7.5  ALBUMIN 1.9* 2.2*     Lipase  No results found for this basename: lipase     Studies/Results: No results found.  Medications: . acetaminophen  1,000 mg Oral TID  . antiseptic oral rinse  15 mL Mouth Rinse q12n4p  . apixaban  5 mg Oral BID  . atorvastatin  40 mg Oral QODAY  . carvedilol  3.125 mg Oral BID WC  . ferrous sulfate  325 mg Oral BID  . furosemide  40 mg Oral Daily  . hydrALAZINE  50 mg Oral Q8H  . insulin aspart  0-9 Units Subcutaneous Q4H  . insulin glargine  35 Units Subcutaneous QHS  . isosorbide mononitrate  15 mg Oral Daily  . lip balm  1 application Topical BID  . octreotide  150 mcg Subcutaneous TID  .  OxyCODONE  15 mg Oral Q12H  . potassium chloride  40 mEq Oral Daily  . saccharomyces boulardii  250 mg Oral BID  . sodium chloride  3 mL Intravenous Q12H  . zolpidem  10 mg Oral QHS    Assessment/Plan Infected prosthetic mesh of abdominal wall with GIANT abscess s/p removal 08/14/2013 Dr. 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  Diabetes mellitus, insulin dependent (IDDM), uncontrolled  HYPERLIPIDEMIA  Obesity, Class III, BMI 40-49.9 (morbid obesity)  OBSTRUCTIVE SLEEP APNEA  Acute renal insufficiency from fistula/wound loss-improved  Deconditioning Post op day 33  Plan: Continue TNA/bowel rest/suction/Eakins.   LOS: 36 days    Tobe Kervin 09/16/2013

## 2013-09-16 NOTE — Progress Notes (Signed)
Patient interviewed and examined, agree with PA note above.  Edward Jolly MD, FACS  09/16/2013 4:23 PM

## 2013-09-16 NOTE — Progress Notes (Signed)
Pt had a fall with assist during transfer from bed to chair with (assist from [place], NT) at approximately 0230 09/16/2013. Per NT report, during transfer pt looked tired and could not proceed to move and stated his "legs were weak"; he began to slide off the edge of the bed and onto the floor, with his back against the bed for support. NT then called out for assistance to the room. Pt denies of any pain from the fall, although does c/o of chronic pain in his right hip, which he has been experiencing recently. Pt denies any new pain, that would be out of the ordinary for him, with movement of limbs. NT reports the pt did not hit is head or hit the floor with any force. Pt sitting on floor and was alert and oriented upon RN arrival to room, but was too weak to stand up from a sitting position while on the floor. Pt was transferred to the bed with the [facility]. VS obtained BP 152/85, HR 81, R 22, O2 sat 97% on CPAP, T 97.67F, and CBG 123. Forrest Moron, NP notified 09/16/2013 at 0305. No new orders received. Will continue to monitor.

## 2013-09-16 NOTE — Progress Notes (Signed)
PARENTERAL NUTRITION CONSULT NOTE - FOLLOW UP  Pharmacy Consult for TNA Indication: Enterocutaneous fistula  No Known Allergies  Patient Measurements: Height: 6' (182.9 cm) Weight: 353 lb 6.4 oz (160.3 kg) IBW/kg (Calculated) : 77.6 Adjusted Body Weight: 102.6kg  Vital Signs: Temp: 98.1 F (36.7 C) (12/11 0550) Temp src: Oral (12/11 0550) BP: 159/85 mmHg (12/11 0550) Pulse Rate: 75 (12/11 0550) Intake/Output from previous day: 12/10 0701 - 12/11 0700 In: 2160 [TPN:2160] Out: 1300 [Urine:975; Drains:325]  Labs: No results found for this basename: WBC, HGB, HCT, PLT, APTT, INR,  in the last 72 hours  Recent Labs  09/16/13 0334  NA 134*  K 4.0  CL 98  CO2 27  GLUCOSE 120*  BUN 23  CREATININE 1.00  CALCIUM 9.2  MG 1.7  PHOS 4.4  PROT 7.5  ALBUMIN 2.2*  AST 87*  ALT 90*  ALKPHOS 104  BILITOT 1.1    Recent Labs  09/16/13 0010 09/16/13 0304 09/16/13 0717  GLUCAP 132* 123* 154*   Insulin Requirements in the past 24 hours:   Lantus increased to 35 units qhs by MD (note Lantus requirements fluctuate recently with a few hypoglycemic events)   Required 5 units SSI  - CBG q4h with sensitive scale coverage  TNA contains regular insulin 40 units per 3L bag (13.3 units/L)  Nutritional Goals:   Re-estimated per RD 12/2:  2370-2650 kcal/day, 160-180 grams/day protein, 3.3-3.5 L/day fluid  Clinimix E5/15 at goal rate 125 ml/hr (3L/day) with 20% lipids at 10 ml/hr to provide 150g protein and 2675 kcal daily.  Due to how much fluid can be placed in TNA bag, maximum volume is 3L.   Current Nutrition:   NPO except for ice chips, sips with meds  Clinimix E5/15 at 163ml/hr and 20% lipids at 10 ml/hr over 24 hours  IVF: NS at [name]: 62 yo male with large abdominal abscess with infected old Gore Tex mesh s/p I&D abscess, removal of mesh, debridement of abdominal wall and peritoneum. 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 TNA started 11/12 at  10pm.  Pt will likely be discharged eventually with TNA.   12/11: Pt continues 24 hour continuous TPN. CBGs elevated yesterday but MD increased Lantus dose for tonight. Will continue the same insulin in TNA today. Pt previously had hypoglycemic events on 12/2 after starting cyclic regimen -since pt w/ morbid obesity, walking only for P/T - there is little advantage in this case to be on cyclic TNA.  Awaiting plans for discharge - pt no longer meets criteria for hospitalization.   Labs:   Glucose: Hx DM on insulin PTA. CBGs improving. Md adjusting Lantus  Electrolytes: Na 134 (unable to adjust in premix TNA). K, Phos and Mag wnl.     Renal:  SCr stable/wnl. UOP = 0.3 ml/kg/hr  LFTs: AST/ALT increased today, f/u.  Albumin 2.2  TGs: wnl  Prealbumin: 10.3 (11/13), 11.1 (11/17), 11.2(11/24), 13.6 (12/1), 10.3 (12/8)  TPN Access: Triple lumen PICC placed 11/12 TPN day#: 29  Plan:   Continue Clinimix E 5/15 at 125 ml/hr with lipids 20% at 10 ml/hr over 24 hours.     Continue insulin 40 units per 3L bag ( 13.3 units/L)  TNA to contain standard multivitamins and trace elements.  Continue CBG checks q4h with sensitive SSI coverage.  F/U closely and adjust insulin coverage as needed.    Lantus 35 units QHS per MD  KCl 43mEq PO daily per MD  Continue NS at [facility]  TNA lab panels on Mondays & Thursdays.  CMET in AM.  F/u discharge plans and TNA arrangement.  Hershal Coria, PharmD, BCPS Pager: [phone] 09/16/2013 8:10 AM

## 2013-09-16 NOTE — Progress Notes (Signed)
Patient ID: Kurt Cross, male   DOB: [birth year], 62 y.o.   MRN: [id number] [facility] PROGRESS NOTE  Kurt Cross [id number] DOB: [birth year] DOA: 08/11/2013 PCP: [name], MD  Brief Narrative: Pt is 62 y.o. Male with known history of chronic systolic heart failure status post defibrillator placement last EF measured was in 2013 was 35-40%, on home oxygen, OSA noncompliant with CPAP, diabetes mellitus, hypertension and hyperlipidemia presented to the ER because right leg pain. He had been hospitalized a week prior with sepsis that was presumed to be due to UTI and discharged home. In the ER, he was found to have a leukocytosis and AKI. Abdominal CT demonstrated infected prosthetic Gore-Tex mesh. His mesh was removed and 3L of pus was drained. 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 He also has chronic a-flutter which is rate controlled. He is on anticoagulation. See below for details.   Assessment/Plan  Infected prosthetic gore-tex mesh  - with large abscess s/p I&D and removal of gore-tex on 11/8. 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  - Continue sandostatin and TNA for the foreseeable future  - Continuous wall suction for the foreseeable future  - no significant clinical change  Sepsis  - due to coag neg staph s/p 2 weeks of vancomycin, resolved.  HTN  - blood pressure stable  - cont coreg and lasix, hydralazine, imdur  #4 atrial fibrillation/atrial flutter  - secondary to problem #1. Now ressolved, CE x 3 neg, 2 D ECHO EF 55%  - currently rate controlled on Coreg  - CHADSVASc score is 4 (not ASVD plaque on abd aorta), resumed eliquis  #5 chronic kidney disease stage III  - stable  - Minimize nephrotoxins  - Renally dose medications #6 leukocytosis/fever  - resolved.  #7 chronic combined systolic and diastolic CHF  - weight stable around 150 kg or  330lbs  - Continue daily lasix  - on BB  - Not on ACEI at this time but is on hydralazine, imdur  OSA,  - stable. Continue CPAP.  #8 Uncontrolled Pain  - continue prn oxycodone as needed  #9 hyperlipidemia  - Continue statin.  #10 4.5 cm ascending aortic aneurysm per CT scan of 08/01/2013  - Stable. Asymptomatic. Continue risk factor modification with good blood pressure control.  #11 type 2 diabetes  - hypoglycemic when trying to cycle TPN 12/2. Hypoglycemic 12/7 because TPN was off.  - Increase lantus to 35 units  - Continue SSI low dose  #12. Hypomagnesemia  - supplemented by TPN   Diet: NPO with ice chips  Access: TL PICC  IVF: TPN  Proph: apixaban   Code Status: full  Family Communication: patient alone  Disposition Plan: Patient is not a good candidate for SNF due to complexity of care, including need for continuous TPN and continuous wall suction. These interventions can easily be managed by an LTACH. The patient is otherwise stable and no longer meets criteria for hospitalization.   Antibiotics  Vancomycin 08/15/13>>> 08/31/13  Zosyn 08/14/13>>>08/31/13  Procedures/Studies:  Ct Abdomen Pelvis Wo Contrast 08/14/2013 14.5 x 18.1 x 26.1 cm fluid collection/abscess within the anterior abdominal wall, as described above, incompletely visualized. The collection appears just anterior to prior ventral hernia mesh. Given the size and proximity to the skin surface, incision and drainage is suggested.  Dg Chest 2 View 08/01/2013 No active cardiopulmonary disease.  Dg Hip Complete Right 08/11/2013 No acute osseous injury  of the right hip.  Ct Angio Chest Pe W/cm &/or Wo Cm 08/01/2013 4.5 cm AAA without complicating features. No evidence of acute pulmonary embolism or aortic dissection.  Dg Chest Port 1 View 08/18/2013 Right upper extremity PICC terminates in the mid superior vena cava. Stable congestive heart failure pattern.  Dg Chest Port 1 View 08/17/2013 Stable chest with changes of  congestive heart failure with pulmonary interstitial edema. Cardiac pacer in stable position.  Dg Chest Portable 1 View 08/15/2013 Stable cardiomegaly with interval worsening of pulmonary vascular congestion/ edema relative to 08/13/2013.  Dg Chest Port 1 View 08/13/2013 No acute findings. Stable cardiomegaly and pulmonary venous prominence without overt edema.  2-D echo 08/12/2013  Incision and drainage of abscess, Gore-Tex mesh removal, debridement of abdominal wall and peritoneum Consultants:  Cardiology: Dr. Terrence Dupont 08/13/2013  General surgery: Dr. Johney Maine 08/14/2013  ID: Dr Megan Salon 08/15/13  HPI/Subjective: No events overnight.   Objective: Filed Vitals:   09/15/13 2314 09/16/13 0252 09/16/13 0550 09/16/13 1414  BP:  152/85 159/85 153/84  Pulse: 65 81 75 88  Temp:  97.3 F (36.3 C) 98.1 F (36.7 C) 98.6 F (37 C)  TempSrc:  Oral Oral Oral  Resp: 16 22 20 20   Height:      Weight:   160.3 kg (353 lb 6.4 oz)   SpO2: 98% 97% 96% 97%    Intake/Output Summary (Last 24 hours) at 09/16/13 1703 Last data filed at 09/16/13 1300  Gross per 24 hour  Intake   2230 ml  Output   1075 ml  Net   1155 ml    Exam:   General:  Pt is alert, follows commands appropriately, not in acute distress  Cardiovascular: Regular rate and rhythm, S1/S2, no murmurs, no rubs, no gallops  Respiratory: Clear to auscultation bilaterally, no wheezing, no crackles, no rhonchi  Abdomen: Soft, non tender, non distended, open abdominal wound with drainage  Extremities: No edema, pulses DP and PT palpable bilaterally  Neuro: Grossly nonfocal  Data Reviewed: Basic Metabolic Panel:  Recent Labs Lab 09/13/13 0402 09/16/13 0334  NA 132* 134*  K 4.2 4.0  CL 97 98  CO2 29 27  GLUCOSE 173* 120*  BUN 25* 23  CREATININE 1.03 1.00  CALCIUM 8.7 9.2  MG 1.8 1.7  PHOS 4.2 4.4   Liver Function Tests:  Recent Labs Lab 09/13/13 0402 09/16/13 0334  AST 53* 87*  ALT 47 90*  ALKPHOS 75 104  BILITOT  1.5* 1.1  PROT 6.9 7.5  ALBUMIN 1.9* 2.2*   CBC:  Recent Labs Lab 09/10/13 0601 09/11/13 0525 09/12/13 0500 09/13/13 0402 09/16/13 1120  WBC 7.9 8.8 9.7 7.9 7.8  NEUTROABS  --   --   --  3.7  --   HGB 11.0* 10.8* 10.7* 10.1* 10.1*  HCT 32.7* 31.8* 31.4* 29.6* 29.9*  MCV 94.2 93.8 93.2 93.1 92.0  PLT 324 326 330 305 339    CBG:  Recent Labs Lab 09/15/13 1950 09/16/13 0010 09/16/13 0304 09/16/13 0717 09/16/13 1137  GLUCAP 135* 132* 123* 154* 148*    No results found for this or any previous visit (from the past 240 hour(s)).   Scheduled Meds: . acetaminophen  1,000 mg Oral TID  . antiseptic oral rinse  15 mL Mouth Rinse q12n4p  . apixaban  5 mg Oral BID  . atorvastatin  40 mg Oral QODAY  . carvedilol  3.125 mg Oral BID WC  . ferrous sulfate  325 mg Oral BID  .  furosemide  40 mg Oral Daily  . hydrALAZINE  50 mg Oral Q8H  . insulin aspart  0-9 Units Subcutaneous Q4H  . insulin glargine  35 Units Subcutaneous QHS  . isosorbide mononitrate  15 mg Oral Daily  . lip balm  1 application Topical BID  . octreotide  150 mcg Subcutaneous TID  . OxyCODONE  15 mg Oral Q12H  . potassium chloride  40 mEq Oral Daily  . saccharomyces boulardii  250 mg Oral BID  . sodium chloride  3 mL Intravenous Q12H  . zolpidem  10 mg Oral QHS   Continuous Infusions: . sodium chloride 20 mL/hr at 09/16/13 0629  . [name] KitchenTPN (CLINIMIX-E) Adult 125 mL/hr at 09/15/13 1711   And  . fat emulsion 250 mL (09/15/13 1712)  . [name] KitchenTPN (CLINIMIX-E) Adult 125 mL/hr at 09/16/13 1701   And  . fat emulsion 250 mL (09/16/13 1702)   Faye Ramsay, MD  [place] Pager [phone]  If 7PM-7AM, please contact night-coverage [email or web address] Password [email or web address] 09/16/2013, 5:03 PM   LOS: 36 days

## 2013-09-17 DIAGNOSIS — E46 Unspecified protein-calorie malnutrition: Secondary | ICD-10-CM

## 2013-09-17 LAB — COMPREHENSIVE METABOLIC PANEL WITH GFR
ALT: 78 U/L — ABNORMAL HIGH (ref 0–53)
AST: 70 U/L — ABNORMAL HIGH (ref 0–37)
Albumin: 2 g/dL — ABNORMAL LOW (ref 3.5–5.2)
Alkaline Phosphatase: 88 U/L (ref 39–117)
BUN: 21 mg/dL (ref 6–23)
CO2: 26 meq/L (ref 19–32)
Calcium: 8.6 mg/dL (ref 8.4–10.5)
Chloride: 97 meq/L (ref 96–112)
Creatinine, Ser: 0.98 mg/dL (ref 0.50–1.35)
GFR calc Af Amer: >90 mL/min
GFR calc non Af Amer: 88 mL/min — ABNORMAL LOW
Glucose, Bld: 131 mg/dL — ABNORMAL HIGH (ref 70–99)
Potassium: 4.1 meq/L (ref 3.5–5.1)
Sodium: 133 meq/L — ABNORMAL LOW (ref 135–145)
Total Bilirubin: 1 mg/dL (ref 0.3–1.2)
Total Protein: 6.7 g/dL (ref 6.0–8.3)

## 2013-09-17 LAB — CBC
MCH: 31.3 pg (ref 26.0–34.0)
MCHC: 33.8 g/dL (ref 30.0–36.0)
MCV: 92.6 fL (ref 78.0–100.0)
Platelets: 331 10*3/uL (ref 150–400)
RBC: 3.23 MIL/uL — ABNORMAL LOW (ref 4.22–5.81)
RDW: 14.2 % (ref 11.5–15.5)
WBC: 8 10*3/uL (ref 4.0–10.5)

## 2013-09-17 LAB — GLUCOSE, CAPILLARY
Glucose-Capillary: 124 mg/dL — ABNORMAL HIGH (ref 70–99)
Glucose-Capillary: 145 mg/dL — ABNORMAL HIGH (ref 70–99)

## 2013-09-17 MED ORDER — FAT EMULSION 20 % IV EMUL
250.0000 mL | INTRAVENOUS | Status: AC
  Administered 2013-09-17: 17:00:00 250 mL via INTRAVENOUS
  Filled 2013-09-17: qty 250

## 2013-09-17 MED ORDER — TRACE MINERALS CR-CU-F-FE-I-MN-MO-SE-ZN IV SOLN
INTRAVENOUS | Status: AC
  Administered 2013-09-17: 17:00:00 via INTRAVENOUS
  Filled 2013-09-17: qty 3000

## 2013-09-17 NOTE — Progress Notes (Signed)
Physical Therapy Treatment Patient Details Name: Kurt Cross MRN: [id number] DOB: Mar 04, 1951 Today's Date: 09/17/2013 Time: [id number] PT Time Calculation (min): 20 min  PT Assessment / Plan / Recommendation  History of Present Illness Kurt Cross is a 62 y.o. male  with known history of chronic systolic heart failure status post defibrillator placement last EF measured was in 2013 was 35-40%, on home oxygen, OSA noncompliant with CPAP, diabetes mellitus, hypertension and hyperlipidemia presented to the ER because right leg pain after sustaining a fall the night prior to admission.  08/14/13: s/p abdominal abscess removal   PT Comments   Pt OOB in reclier.  Assisted with standing to amb in hallway.  Pt required + 2 assist for safety and increased time.  Pt "slide to the floor" the other day with nursing and had no c/o pain during session.    Follow Up Recommendations  SNF     Does the patient have the potential to tolerate intense rehabilitation     Barriers to Discharge        Equipment Recommendations       Recommendations for Other Services    Frequency Min 2X/week   Progress towards PT Goals Progress towards PT goals: Progressing toward goals  Plan      Precautions / Restrictions Precautions Precautions: Fall Precaution Comments: ostomy bag on R side and wall suction to ABD    Pertinent Vitals/Pain No c/o pain    Mobility  Bed Mobility Bed Mobility: Not assessed Details for Bed Mobility Assistance: Pt OOB in recliner Transfers Transfers: Sit to Stand;Stand to Sit Sit to Stand: 1: +2 Total assist Sit to Stand: Patient Percentage: 70% Stand to Sit: 1: +2 Total assist Stand to Sit: Patient Percentage: 70% Details for Transfer Assistance:  x 2 attempts with sit to satnd with one posterior LOB.  Tends to sit with decreased control Ambulation/Gait Ambulation/Gait Assistance: 4: Min guard;5: Supervision Ambulation Distance (Feet): 150 Feet Ambulation/Gait Assistance  Details: increased time Gait Pattern: Step-through pattern;Wide base of support;Decreased stride length;Ataxic Gait velocity: decreased     PT Goals (current goals can now be found in the care plan section)    Visit Information  Last PT Received On: 09/17/13 Assistance Needed: +2 (equipment and safety) History of Present Illness: Kurt Cross is a 62 y.o. male  with known history of chronic systolic heart failure status post defibrillator placement last EF measured was in 2013 was 35-40%, on home oxygen, OSA noncompliant with CPAP, diabetes mellitus, hypertension and hyperlipidemia presented to the ER because right leg pain after sustaining a fall the night prior to admission.  08/14/13: s/p abdominal abscess removal    Subjective Data      Cognition       Balance     End of Session PT - End of Session Equipment Utilized During Treatment: Gait belt Activity Tolerance: Patient tolerated treatment well Patient left: in chair;with call bell/phone within reach;with family/visitor present   Rica Koyanagi  PTA [facility]  Acute  Rehab Pager      [phone]

## 2013-09-17 NOTE — Progress Notes (Signed)
Patient ID: Kurt Cross, male   DOB: 1951-07-23, 62 y.o.   MRN: [id number] 34 Days Post-Op  Subjective: No change.  Objective: Vital signs in last 24 hours: Temp:  [97.3 F (36.3 C)-98.6 F (37 C)] 97.3 F (36.3 C) (12/12 0440) Pulse Rate:  [61-88] 61 (12/12 0440) Resp:  [18-20] 18 (12/12 0440) BP: (144-153)/(72-84) 144/72 mmHg (12/12 0440) SpO2:  [97 %-100 %] 100 % (12/12 0440) Weight:  [345 lb 7.4 oz (156.7 kg)] 345 lb 7.4 oz (156.7 kg) (12/12 0440) Last BM Date: 09/01/13  Intake/Output from previous day: 12/11 0701 - 12/12 0700 In: 90 [P.O.:90] Out: 665 [Urine:475; Drains:190] Intake/Output this shift:    PE: Abd: soft, NT, ND, obese, open wound is stable with fistulas visible.  Eakin's in place with succus output noted in cannister   Lab Results:   Recent Labs  09/16/13 1120 09/17/13 0405  WBC 7.8 8.0  HGB 10.1* 10.1*  HCT 29.9* 29.9*  PLT 339 331   BMET  Recent Labs  09/16/13 0334 09/17/13 0405  NA 134* 133*  K 4.0 4.1  CL 98 97  CO2 27 26  GLUCOSE 120* 131*  BUN 23 21  CREATININE 1.00 0.98  CALCIUM 9.2 8.6   PT/INR No results found for this basename: LABPROT, INR,  in the last 72 hours CMP     Component Value Date/Time   NA 133* 09/17/2013 0405   K 4.1 09/17/2013 0405   CL 97 09/17/2013 0405   CO2 26 09/17/2013 0405   GLUCOSE 131* 09/17/2013 0405   BUN 21 09/17/2013 0405   CREATININE 0.98 09/17/2013 0405   CALCIUM 8.6 09/17/2013 0405   PROT 6.7 09/17/2013 0405   ALBUMIN 2.0* 09/17/2013 0405   AST 70* 09/17/2013 0405   ALT 78* 09/17/2013 0405   ALKPHOS 88 09/17/2013 0405   BILITOT 1.0 09/17/2013 0405   GFRNONAA 88* 09/17/2013 0405   GFRAA >90 09/17/2013 0405   Lipase  No results found for this basename: lipase       Studies/Results: No results found.  Anti-infectives: Anti-infectives   Start     Dose/Rate Route Frequency Ordered Stop   08/31/13 0800  vancomycin (VANCOCIN) 1,750 mg in sodium chloride 0.9 % 500 mL IVPB   Status:  Discontinued     1,750 mg 250 mL/hr over 120 Minutes Intravenous Every 24 hours 08/31/13 0753 08/31/13 1417   08/16/13 0800  vancomycin (VANCOCIN) 2,000 mg in sodium chloride 0.9 % 500 mL IVPB  Status:  Discontinued     2,000 mg 250 mL/hr over 120 Minutes Intravenous Every 24 hours 08/15/13 1411 08/31/13 0753   08/15/13 1500  vancomycin (VANCOCIN) 2,000 mg in sodium chloride 0.9 % 500 mL IVPB     2,000 mg 250 mL/hr over 120 Minutes Intravenous  Once 08/15/13 1411 08/15/13 1713   08/14/13 1600  piperacillin-tazobactam (ZOSYN) IVPB 3.375 g  Status:  Discontinued     3.375 g 12.5 mL/hr over 240 Minutes Intravenous Every 8 hours 08/14/13 1543 08/31/13 1417       Assessment/Plan POD 34  Infected prosthetic mesh of abdominal wall with GIANT abscess s/p removal 08/14/2013 Dr. 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  Diabetes mellitus, insulin dependent (IDDM), uncontrolled  HYPERLIPIDEMIA  Obesity, Class III, BMI 40-49.9 (morbid obesity)  OBSTRUCTIVE SLEEP APNEA  Acute renal insufficiency from fistula/wound loss-improved  Deconditioning   Plan: 1. Cont current care. 2.  Cont TNA for nutritional support  LOS: 37 days    Saul Dorsi E 09/17/2013, 8:34 AM Pager: [phone]

## 2013-09-17 NOTE — Progress Notes (Signed)
Patient interviewed and examined, agree with PA note above.  Edward Jolly MD, FACS  09/17/2013 6:01 PM

## 2013-09-17 NOTE — Progress Notes (Signed)
PARENTERAL NUTRITION CONSULT NOTE - FOLLOW UP  Pharmacy Consult for TNA Indication: Enterocutaneous fistula  No Known Allergies  Patient Measurements: Height: 6' (182.9 cm) Weight: 345 lb 7.4 oz (156.7 kg) IBW/kg (Calculated) : 77.6 Adjusted Body Weight: 102.6kg  Vital Signs: Temp: 97.3 F (36.3 C) (12/12 0440) Temp src: Axillary (12/12 0440) BP: 144/72 mmHg (12/12 0440) Pulse Rate: 61 (12/12 0440) Intake/Output from previous day: 12/11 0701 - 12/12 0700 In: 90 [P.O.:90] Out: 665 [Urine:475; Drains:190]  Labs:  Recent Labs  09/16/13 1120 09/17/13 0405  WBC 7.8 8.0  HGB 10.1* 10.1*  HCT 29.9* 29.9*  PLT 339 331    Recent Labs  09/16/13 0334 09/17/13 0405  NA 134* 133*  K 4.0 4.1  CL 98 97  CO2 27 26  GLUCOSE 120* 131*  BUN 23 21  CREATININE 1.00 0.98  CALCIUM 9.2 8.6  MG 1.7  --   PHOS 4.4  --   PROT 7.5 6.7  ALBUMIN 2.2* 2.0*  AST 87* 70*  ALT 90* 78*  ALKPHOS 104 88  BILITOT 1.1 1.0    Recent Labs  09/16/13 1708 09/16/13 1956 09/16/13 2359  GLUCAP 145* 166* 147*   Insulin Requirements in the past 24 hours:   Lantus increased to 35 units qhs by MD (note Lantus requirements fluctuate recently with a few hypoglycemic events)   Required 8 units SSI  - CBG q4h with sensitive scale coverage  TNA contains regular insulin 40 units per 3L bag (13.3 units/L)  Nutritional Goals:   Re-estimated per RD 12/2:  2370-2650 kcal/day, 160-180 grams/day protein, 3.3-3.5 L/day fluid  Clinimix E5/15 at goal rate 125 ml/hr (3L/day) with 20% lipids at 10 ml/hr to provide 150g protein and 2675 kcal daily.  Due to how much fluid can be placed in TNA bag, maximum volume is 3L.   Current Nutrition:   NPO except for ice chips, sips with meds  Clinimix E5/15 at 176ml/hr and 20% lipids at 10 ml/hr over 24 hours  IVF: NS at [place]: 62 yo male with large abdominal abscess with infected old Gore Tex mesh s/p I&D abscess, removal of mesh, debridement of  abdominal wall and peritoneum. 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 TNA started 11/12 at 10pm.  Patient currently unable to be discharged to a facility nearby that can accommodate both TNA and drainage system.   12/12: Pt continues 24 hour continuous TPN.  Per surgery, fistula output has significantly increased since the drainage system was fixed, 540 mL output recorded yesterday.  Continuing sandostatin and TNA as patient on bowel rest.  Labs:   Glucose: Hx DM on insulin PTA. CBGs improving.   Electrolytes: Na 133 (unable to adjust in premix TNA). K, Phos and Mag wnl.     Renal:  SCr stable/wnl. UOP = 0.3 ml/kg/hr  LFTs: AST/ALT increased but improved this AM.  Albumin 2.0  TGs: wnl  Prealbumin: 10.3 (11/13), 11.1 (11/17), 11.2(11/24), 13.6 (12/1), 10.3 (12/8)  TPN Access: Triple lumen PICC placed 11/12 TPN day#: 30  Plan:   Continue Clinimix E 5/15 at 125 ml/hr with lipids 20% at 10 ml/hr over 24 hours.     Continue insulin 40 units per 3L bag ( 13.3 units/L)  TNA to contain standard multivitamins and trace elements.  Continue CBG checks q4h with sensitive SSI coverage.     Lantus 35 units QHS per MD  KCl 27mEq PO daily per MD  [facility] at [facility]  TNA lab panels on  Mondays & Thursdays.  CMET in AM.  F/u discharge plans and TNA arrangement.  Hershal Coria, PharmD, BCPS Pager: [phone] 09/17/2013 7:31 AM

## 2013-09-17 NOTE — Progress Notes (Signed)
Patient ID: Kurt Cross, male   DOB: Jun 21, 1951, 62 y.o.   MRN: [id number] [facility] PROGRESS NOTE  Kurt Cross [id number] DOB: 03/26/1951 DOA: 08/11/2013 PCP: [name], MD  Brief Narrative:  Pt is 62 y.o. Male with known history of chronic systolic heart failure status post defibrillator placement last EF measured was in 2013 was 35-40%, on home oxygen, OSA noncompliant with CPAP, diabetes mellitus, hypertension and hyperlipidemia presented to the ER because right leg pain. He had been hospitalized a week prior with sepsis that was presumed to be due to UTI and discharged home. In the ER, he was found to have a leukocytosis and AKI. Abdominal CT demonstrated infected prosthetic Gore-Tex mesh. His mesh was removed and 3L of pus was drained. 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 He also has chronic a-flutter which is rate controlled. He is on anticoagulation. See below for details.   Assessment/Plan  Infected prosthetic gore-tex mesh  - with large abscess s/p I&D and removal of gore-tex on 11/8. 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  - Continue sandostatin and TNA for now - Continuous wall suction for the foreseeable future  - no significant clinical change  Sepsis  - due to coag neg staph s/p 2 weeks of vancomycin, resolved.  HTN  - blood pressure stable  - cont coreg and lasix, hydralazine, imdur  #4 atrial fibrillation/atrial flutter  - secondary to problem #1. Now ressolved, CE x 3 neg, 2 D ECHO EF 55%  - currently rate controlled on Coreg  - CHADSVASc score is 4 (not ASVD plaque on abd aorta), resumed eliquis  #5 chronic kidney disease stage III  - stable  - Minimize nephrotoxins  - Renally dose medications  #6 leukocytosis/fever  - resolved.  #7 chronic combined systolic and diastolic CHF  - weight stable around 150 kg or 330lbs  - Continue  daily lasix  - on BB  - Not on ACEI at this time but is on hydralazine, imdur  OSA,  - stable. Continue CPAP.  #8 Uncontrolled Pain  - continue prn oxycodone as needed  #9 hyperlipidemia  - Continue statin.  #10 4.5 cm ascending aortic aneurysm per CT scan of 08/01/2013  - Stable. Asymptomatic. Continue risk factor modification with good blood pressure control.  #11 type 2 diabetes  - hypoglycemic when trying to cycle TPN 12/2. Hypoglycemic 12/7 because TPN was off.  - Increase lantus to 35 units  - Continue SSI low dose  #12. Hypomagnesemia  - supplemented by TPN   Diet: NPO with ice chips  Access: TL PICC  IVF: TPN  Proph: apixaban   Code Status: full  Family Communication: patient alone  Disposition Plan: Patient is not a good candidate for SNF due to complexity of care, including need for continuous TPN and continuous wall suction. These interventions can easily be managed by an LTACH. The patient is otherwise stable and no longer meets criteria for hospitalization.   Antibiotics  Vancomycin 08/15/13>>> 08/31/13  Zosyn 08/14/13>>>08/31/13  Procedures/Studies:  Ct Abdomen Pelvis Wo Contrast 08/14/2013 14.5 x 18.1 x 26.1 cm fluid collection/abscess within the anterior abdominal wall, as described above, incompletely visualized. The collection appears just anterior to prior ventral hernia mesh. Given the size and proximity to the skin surface, incision and drainage is suggested.  Dg Chest 2 View 08/01/2013 No active cardiopulmonary disease.  Dg Hip Complete Right 08/11/2013 No acute osseous injury of  the right hip.  Ct Angio Chest Pe W/cm &/or Wo Cm 08/01/2013 4.5 cm AAA without complicating features. No evidence of acute pulmonary embolism or aortic dissection.  Dg Chest Port 1 View 08/18/2013 Right upper extremity PICC terminates in the mid superior vena cava. Stable congestive heart failure pattern.  Dg Chest Port 1 View 08/17/2013 Stable chest with changes of congestive heart  failure with pulmonary interstitial edema. Cardiac pacer in stable position.  Dg Chest Portable 1 View 08/15/2013 Stable cardiomegaly with interval worsening of pulmonary vascular congestion/ edema relative to 08/13/2013.  Dg Chest Port 1 View 08/13/2013 No acute findings. Stable cardiomegaly and pulmonary venous prominence without overt edema.  2-D echo 08/12/2013  Incision and drainage of abscess, Gore-Tex mesh removal, debridement of abdominal wall and peritoneum Consultants:  Cardiology: Dr. Terrence Dupont 08/13/2013  General surgery: Dr. Johney Maine 08/14/2013  ID: Dr Megan Salon 08/15/13   HPI/Subjective: No events overnight.   Objective: Filed Vitals:   09/16/13 0550 09/16/13 1414 09/16/13 2050 09/17/13 0440  BP: 159/85 153/84 144/77 144/72  Pulse: 75 88 67 61  Temp: 98.1 F (36.7 C) 98.6 F (37 C) 98.4 F (36.9 C) 97.3 F (36.3 C)  TempSrc: Oral Oral Oral Axillary  Resp: 20 20 18 18   Height:      Weight: 160.3 kg (353 lb 6.4 oz)   156.7 kg (345 lb 7.4 oz)  SpO2: 96% 97% 100% 100%    Intake/Output Summary (Last 24 hours) at 09/17/13 [id number] Last data filed at 09/16/13 2200  Gross per 24 hour  Intake   1240 ml  Output   1015 ml  Net    225 ml    Exam:   General:  Pt is alert, follows commands appropriately, not in acute distress  Cardiovascular: Regular rate and rhythm, S1/S2, no murmurs, no rubs, no gallops  Respiratory: Clear to auscultation bilaterally, no wheezing, no crackles, no rhonchi  Abdomen: Soft, non tender, non distended, bowel sounds present, no guarding  Extremities: No edema, pulses DP and PT palpable bilaterally  Neuro: Grossly nonfocal  Data Reviewed: Basic Metabolic Panel:  Recent Labs Lab 09/13/13 0402 09/16/13 0334 09/17/13 0405  NA 132* 134* 133*  K 4.2 4.0 4.1  CL 97 98 97  CO2 29 27 26   GLUCOSE 173* 120* 131*  BUN 25* 23 21  CREATININE 1.03 1.00 0.98  CALCIUM 8.7 9.2 8.6  MG 1.8 1.7  --   PHOS 4.2 4.4  --    Liver Function  Tests:  Recent Labs Lab 09/13/13 0402 09/16/13 0334 09/17/13 0405  AST 53* 87* 70*  ALT 47 90* 78*  ALKPHOS 75 104 88  BILITOT 1.5* 1.1 1.0  PROT 6.9 7.5 6.7  ALBUMIN 1.9* 2.2* 2.0*   No results found for this basename: LIPASE, AMYLASE,  in the last 168 hours No results found for this basename: AMMONIA,  in the last 168 hours CBC:  Recent Labs Lab 09/11/13 0525 09/12/13 0500 09/13/13 0402 09/16/13 1120 09/17/13 0405  WBC 8.8 9.7 7.9 7.8 8.0  NEUTROABS  --   --  3.7  --   --   HGB 10.8* 10.7* 10.1* 10.1* 10.1*  HCT 31.8* 31.4* 29.6* 29.9* 29.9*  MCV 93.8 93.2 93.1 92.0 92.6  PLT 326 330 305 339 331   CBG:  Recent Labs Lab 09/16/13 0717 09/16/13 1137 09/16/13 1708 09/16/13 1956 09/16/13 2359  GLUCAP 154* 148* 145* 166* 147*   Scheduled Meds: . acetaminophen  1,000 mg Oral TID  . antiseptic  oral rinse  15 mL Mouth Rinse q12n4p  . apixaban  5 mg Oral BID  . atorvastatin  40 mg Oral QODAY  . carvedilol  3.125 mg Oral BID WC  . ferrous sulfate  325 mg Oral BID  . furosemide  40 mg Oral Daily  . hydrALAZINE  50 mg Oral Q8H  . insulin aspart  0-9 Units Subcutaneous Q4H  . insulin glargine  35 Units Subcutaneous QHS  . isosorbide mononitrate  15 mg Oral Daily  . lip balm  1 application Topical BID  . octreotide  150 mcg Subcutaneous TID  . OxyCODONE  15 mg Oral Q12H  . potassium chloride  40 mEq Oral Daily  . saccharomyces boulardii  250 mg Oral BID  . sodium chloride  3 mL Intravenous Q12H  . zolpidem  10 mg Oral QHS   Continuous Infusions: . sodium chloride 20 mL/hr at 09/16/13 0629  . [name] KitchenTPN (CLINIMIX-E) Adult 125 mL/hr at 09/16/13 1701   And  . fat emulsion 250 mL (09/16/13 1702)     Faye Ramsay, MD  [employer] Pager [phone]  If 7PM-7AM, please contact night-coverage [email or web address] Password [facility] 09/17/2013, 6:33 AM   LOS: 37 days

## 2013-09-18 LAB — COMPREHENSIVE METABOLIC PANEL
ALT: 71 U/L — ABNORMAL HIGH (ref 0–53)
AST: 59 U/L — ABNORMAL HIGH (ref 0–37)
Albumin: 2.1 g/dL — ABNORMAL LOW (ref 3.5–5.2)
Alkaline Phosphatase: 98 U/L (ref 39–117)
BUN: 23 mg/dL (ref 6–23)
CO2: 27 mEq/L (ref 19–32)
Calcium: 9.1 mg/dL (ref 8.4–10.5)
Chloride: 97 mEq/L (ref 96–112)
Creatinine, Ser: 0.98 mg/dL (ref 0.50–1.35)
GFR calc Af Amer: >90 mL/min (ref >90)
GFR calc non Af Amer: 88 mL/min — ABNORMAL LOW (ref >90)
Glucose, Bld: 110 mg/dL — ABNORMAL HIGH (ref 70–99)
Potassium: 4.2 mEq/L (ref 3.5–5.1)
Sodium: 133 mEq/L — ABNORMAL LOW (ref 135–145)
Total Bilirubin: 1 mg/dL (ref 0.3–1.2)
Total Protein: 7.4 g/dL (ref 6.0–8.3)

## 2013-09-18 LAB — GLUCOSE, CAPILLARY
Glucose-Capillary: 110 mg/dL — ABNORMAL HIGH (ref 70–99)
Glucose-Capillary: 110 mg/dL — ABNORMAL HIGH (ref 70–99)
Glucose-Capillary: 115 mg/dL — ABNORMAL HIGH (ref 70–99)
Glucose-Capillary: 143 mg/dL — ABNORMAL HIGH (ref 70–99)
Glucose-Capillary: 148 mg/dL — ABNORMAL HIGH (ref 70–99)
Glucose-Capillary: 62 mg/dL — ABNORMAL LOW (ref 70–99)

## 2013-09-18 MED ORDER — FAT EMULSION 20 % IV EMUL
250.0000 mL | INTRAVENOUS | Status: AC
  Administered 2013-09-18: 18:00:00 250 mL via INTRAVENOUS
  Filled 2013-09-18: qty 250

## 2013-09-18 MED ORDER — M.V.I. ADULT IV INJ
INTRAVENOUS | Status: AC
  Administered 2013-09-18: 18:00:00 via INTRAVENOUS
  Filled 2013-09-18: qty 3000

## 2013-09-18 MED ORDER — DEXTROSE 50 % IV SOLN
INTRAVENOUS | Status: AC
  Filled 2013-09-18: qty 50

## 2013-09-18 MED ORDER — DEXTROSE 50 % IV SOLN
25.0000 mL | Freq: Once | INTRAVENOUS | Status: AC | PRN
  Administered 2013-09-18: 25 mL via INTRAVENOUS

## 2013-09-18 NOTE — Consult Note (Signed)
WOC wound follow up Wound type:Open abdomen with enteric fistula. Pouch is changed today for routine evaluation and wound care. System indicates washout is imminent, but it is not leaking. Measurement:19cm x 9cm x 1.5cm. Undermining is nearly circumferential with undermining measuring 6.5cm at 3 o'clock, 6.0cm at 6 o'clock, and 4cm at 12 o'clock.  The wound edge is closed at 9 o'clock. Wound bed: 75% pink, 25% thick yellow eschar at proximal portion of wound. Drainage (amount, consistency, odor) thickening, but still liquid stool, dark green/brown Periwound:intact. Dressing procedure/placement/frequency: Wound cleansed with NS, patted dry.   Periwound protected with Eakin strips, then fistula pouch applied after 1 piece of silicone dressing applied to lower half of wound. Stomatized fistula is not covered with silicone dressing.  Red Rubber catheter introduced to closed system via a small opening in the top of the pouch. System attached to low wall continuous suction at approximately 27mmHg. Patient tolerated procedure well,.  Wife in to visit at end of pouch change. LLQ site of colostomy is granulating in and has decreased in size since my last visit, now measures 1cm x 2cm and 1cm depth. [place] nursing team will continue to follow, and will remain available to this patient, the nursing and medical/surgical teams.  Please re-consult if needed in between visits. Thanks, Maudie Flakes, MSN, RN, [place], [place], [place] [phone])

## 2013-09-18 NOTE — Progress Notes (Signed)
35 Days Post-Op  Subjective: No complaints.  Talking on cellphone.  Objective: Vital signs in last 24 hours: Temp:  [98.1 F (36.7 C)-98.7 F (37.1 C)] 98.1 F (36.7 C) (12/13 0532) Pulse Rate:  [60-68] 60 (12/13 0532) Resp:  [17-20] 18 (12/13 0532) BP: (128-149)/(54-80) 149/80 mmHg (12/13 0532) SpO2:  [97 %-100 %] 100 % (12/13 0532) Weight:  [330 lb 11 oz (150 kg)] 330 lb 11 oz (150 kg) (12/13 0500) Last BM Date: 09/01/13  Intake/Output from previous day: 12/12 0701 - 12/13 0700 In: 1160 [I.V.:80; TPN:1080] Out: 1970 [Urine:1720; Stool:250] Intake/Output this shift: Total I/O In: 2247.8 [TPN:2247.8] Out: -   PE: General- In NAD Abdomen-soft, open wound granulating in, fistula at 4:00 position with green liquid output  Lab Results:   Recent Labs  09/16/13 1120 09/17/13 0405  WBC 7.8 8.0  HGB 10.1* 10.1*  HCT 29.9* 29.9*  PLT 339 331   BMET  Recent Labs  09/17/13 0405 09/18/13 0339  NA 133* 133*  K 4.1 4.2  CL 97 97  CO2 26 27  GLUCOSE 131* 110*  BUN 21 23  CREATININE 0.98 0.98  CALCIUM 8.6 9.1   PT/INR No results found for this basename: LABPROT, INR,  in the last 72 hours Comprehensive Metabolic Panel:    Component Value Date/Time   NA 133* 09/18/2013 0339   K 4.2 09/18/2013 0339   CL 97 09/18/2013 0339   CO2 27 09/18/2013 0339   BUN 23 09/18/2013 0339   CREATININE 0.98 09/18/2013 0339   GLUCOSE 110* 09/18/2013 0339   CALCIUM 9.1 09/18/2013 0339   AST 59* 09/18/2013 0339   ALT 71* 09/18/2013 0339   ALKPHOS 98 09/18/2013 0339   BILITOT 1.0 09/18/2013 0339   PROT 7.4 09/18/2013 0339   ALBUMIN 2.1* 09/18/2013 0339     Studies/Results: No results found.  Anti-infectives: Anti-infectives   Start     Dose/Rate Route Frequency Ordered Stop   08/31/13 0800  vancomycin (VANCOCIN) 1,750 mg in sodium chloride 0.9 % 500 mL IVPB  Status:  Discontinued     1,750 mg 250 mL/hr over 120 Minutes Intravenous Every 24 hours 08/31/13 0753 08/31/13  1417   08/16/13 0800  vancomycin (VANCOCIN) 2,000 mg in sodium chloride 0.9 % 500 mL IVPB  Status:  Discontinued     2,000 mg 250 mL/hr over 120 Minutes Intravenous Every 24 hours 08/15/13 1411 08/31/13 0753   08/15/13 1500  vancomycin (VANCOCIN) 2,000 mg in sodium chloride 0.9 % 500 mL IVPB     2,000 mg 250 mL/hr over 120 Minutes Intravenous  Once 08/15/13 1411 08/15/13 1713   08/14/13 1600  piperacillin-tazobactam (ZOSYN) IVPB 3.375 g  Status:  Discontinued     3.375 g 12.5 mL/hr over 240 Minutes Intravenous Every 8 hours 08/14/13 1543 08/31/13 1417      Assessment Infected prosthetic mesh of abdominal wall with GIANT abscess s/p removal 08/14/2013 Dr. 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

## 2013-09-18 NOTE — Progress Notes (Signed)
Patient ID: Kurt Cross, male   DOB: 19-Nov-1950, 62 y.o.   MRN: [id number] [facility] PROGRESS NOTE  Kurt Cross [id number] DOB: June 28, 1951 DOA: 08/11/2013 PCP: [name], MD  Brief Narrative:  Pt is 62 y.o. Male with known history of chronic systolic heart failure status post defibrillator placement last EF measured was in 2013 was 35-40%, on home oxygen, OSA noncompliant with CPAP, diabetes mellitus, hypertension and hyperlipidemia presented to the ER because right leg pain. He had been hospitalized a week prior with sepsis that was presumed to be due to UTI and discharged home. In the ER, he was found to have a leukocytosis and AKI. Abdominal CT demonstrated infected prosthetic Gore-Tex mesh. His mesh was removed and 3L of pus was drained. 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 He also has chronic a-flutter which is rate controlled. He is on anticoagulation. See below for details.   Assessment/Plan  Infected prosthetic gore-tex mesh  - with large abscess s/p I&D and removal of gore-tex on 11/8. 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  - Continue sandostatin and TNA for now  - Continuous wall suction  - no significant clinical change  Sepsis  - due to coag neg staph s/p 2 weeks of vancomycin, resolved.  HTN  - blood pressure stable  - cont coreg and lasix, hydralazine, imdur  #4 atrial fibrillation/atrial flutter  - secondary to problem #1. Now ressolved, CE x 3 neg, 2 D ECHO EF 55%  - currently rate controlled on Coreg  - CHADSVASc score is 4 (not ASVD plaque on abd aorta), resumed eliquis  #5 chronic kidney disease stage III  - stable, Cr WNL #6 leukocytosis/fever  - resolved.  #7 chronic combined systolic and diastolic CHF  - weight stable around 150 kg or 330lbs  - clinically compensated  OSA,  - stable. Continue CPAP.  #8 Uncontrolled Pain  -  continue prn oxycodone as needed  #9 hyperlipidemia  - Continue statin.  #10 4.5 cm ascending aortic aneurysm per CT scan of 08/01/2013  - Stable. Asymptomatic.  #11 type 2 diabetes  - continue Lantus and SSI #12. Hypomagnesemia  - supplemented by TPN   Diet: NPO with ice chips  Access: TL PICC  IVF: TPN  Proph: apixaban   Code Status: full  Family Communication: patient alone  Disposition Plan: Patient is not a good candidate for SNF due to complexity of care, including need for continuous TPN and continuous wall suction. These interventions can easily be managed by an LTACH. The patient is otherwise stable and no longer meets criteria for hospitalization.   Antibiotics  Vancomycin 08/15/13>>> 08/31/13  Zosyn 08/14/13>>>08/31/13  Procedures/Studies:  Ct Abdomen Pelvis Wo Contrast 08/14/2013 14.5 x 18.1 x 26.1 cm fluid collection/abscess within the anterior abdominal wall, as described above, incompletely visualized. The collection appears just anterior to prior ventral hernia mesh. Given the size and proximity to the skin surface, incision and drainage is suggested.  Dg Chest 2 View 08/01/2013 No active cardiopulmonary disease.  Dg Hip Complete Right 08/11/2013 No acute osseous injury of the right hip.  Ct Angio Chest Pe W/cm &/or Wo Cm 08/01/2013 4.5 cm AAA without complicating features. No evidence of acute pulmonary embolism or aortic dissection.  Dg Chest Port 1 View 08/18/2013 Right upper extremity PICC terminates in the mid superior vena cava. Stable congestive heart failure pattern.  Dg Chest Port 1 View 08/17/2013 Stable chest with  changes of congestive heart failure with pulmonary interstitial edema. Cardiac pacer in stable position.  Dg Chest Portable 1 View 08/15/2013 Stable cardiomegaly with interval worsening of pulmonary vascular congestion/ edema relative to 08/13/2013.  Dg Chest Port 1 View 08/13/2013 No acute findings. Stable cardiomegaly and pulmonary venous prominence  without overt edema.  2-D echo 08/12/2013  Incision and drainage of abscess, Gore-Tex mesh removal, debridement of abdominal wall and peritoneum Consultants:  Cardiology: Dr. Terrence Dupont 08/13/2013  General surgery: Dr. Johney Maine 08/14/2013  ID: Dr Megan Salon 08/15/13     HPI/Subjective: No events overnight.   Objective: Filed Vitals:   09/17/13 2136 09/17/13 2350 09/18/13 0500 09/18/13 0532  BP: 138/73   149/80  Pulse: 62 68  60  Temp: 98.4 F (36.9 C)   98.1 F (36.7 C)  TempSrc: Oral   Oral  Resp: 18 17  18   Height:      Weight:   150 kg (330 lb 11 oz)   SpO2: 99% 100%  100%    Intake/Output Summary (Last 24 hours) at 09/18/13 0729 Last data filed at 09/18/13 [id number]  Gross per 24 hour  Intake 3407.75 ml  Output   1970 ml  Net 1437.75 ml    Exam:   General:  Pt is alert, follows commands appropriately, not in acute distress  Cardiovascular: Regular rate and rhythm, S1/S2, no murmurs, no rubs, no gallops  Respiratory: Clear to auscultation bilaterally, no wheezing, no crackles, no rhonchi  Abdomen: Soft, non tender, non distended, open abd wound  Extremities: No edema, pulses DP and PT palpable bilaterally  Neuro: Grossly nonfocal  Data Reviewed: Basic Metabolic Panel:  Recent Labs Lab 09/13/13 0402 09/16/13 0334 09/17/13 0405 09/18/13 0339  NA 132* 134* 133* 133*  K 4.2 4.0 4.1 4.2  CL 97 98 97 97  CO2 29 27 26 27   GLUCOSE 173* 120* 131* 110*  BUN 25* 23 21 23   CREATININE 1.03 1.00 0.98 0.98  CALCIUM 8.7 9.2 8.6 9.1  MG 1.8 1.7  --   --   PHOS 4.2 4.4  --   --    Liver Function Tests:  Recent Labs Lab 09/13/13 0402 09/16/13 0334 09/17/13 0405 09/18/13 0339  AST 53* 87* 70* 59*  ALT 47 90* 78* 71*  ALKPHOS 75 104 88 98  BILITOT 1.5* 1.1 1.0 1.0  PROT 6.9 7.5 6.7 7.4  ALBUMIN 1.9* 2.2* 2.0* 2.1*   CBC:  Recent Labs Lab 09/12/13 0500 09/13/13 0402 09/16/13 1120 09/17/13 0405  WBC 9.7 7.9 7.8 8.0  NEUTROABS  --  3.7  --   --   HGB  10.7* 10.1* 10.1* 10.1*  HCT 31.4* 29.6* 29.9* 29.9*  MCV 93.2 93.1 92.0 92.6  PLT 330 305 339 331   CBG:  Recent Labs Lab 09/17/13 1125 09/17/13 1657 09/17/13 2010 09/18/13 0013 09/18/13 0405  GLUCAP 145* 148* 137* 148* 110*    No results found for this or any previous visit (from the past 240 hour(s)).   Scheduled Meds: . acetaminophen  1,000 mg Oral TID  . antiseptic oral rinse  15 mL Mouth Rinse q12n4p  . apixaban  5 mg Oral BID  . atorvastatin  40 mg Oral QODAY  . carvedilol  3.125 mg Oral BID WC  . ferrous sulfate  325 mg Oral BID  . furosemide  40 mg Oral Daily  . hydrALAZINE  50 mg Oral Q8H  . insulin aspart  0-9 Units Subcutaneous Q4H  . insulin glargine  35  Units Subcutaneous QHS  . isosorbide mononitrate  15 mg Oral Daily  . lip balm  1 application Topical BID  . octreotide  150 mcg Subcutaneous TID  . OxyCODONE  15 mg Oral Q12H  . potassium chloride  40 mEq Oral Daily  . saccharomyces boulardii  250 mg Oral BID  . sodium chloride  3 mL Intravenous Q12H  . zolpidem  10 mg Oral QHS   Continuous Infusions: . sodium chloride 20 mL/hr (09/18/13 0709)  . [name] KitchenTPN (CLINIMIX-E) Adult 125 mL/hr at 09/17/13 1716   And  . fat emulsion 250 mL (09/17/13 1716)   Faye Ramsay, MD  [place] Pager [phone]  If 7PM-7AM, please contact night-coverage [email or web address] Password [email or web address] 09/18/2013, 7:29 AM   LOS: 38 days

## 2013-09-18 NOTE — Progress Notes (Signed)
PARENTERAL NUTRITION CONSULT NOTE - FOLLOW UP  Pharmacy Consult for TNA Indication: Enterocutaneous fistula  No Known Allergies  Patient Measurements: Height: 6' (182.9 cm) Weight: 330 lb 11 oz (150 kg) IBW/kg (Calculated) : 77.6 Adjusted Body Weight: 102.6kg  Vital Signs: Temp: 98.1 F (36.7 C) (12/13 0532) Temp src: Oral (12/13 0532) BP: 149/80 mmHg (12/13 0532) Pulse Rate: 60 (12/13 0532) Intake/Output from previous day: 12/12 0701 - 12/13 0700 In: 1160 [I.V.:80; TPN:1080] Out: 1970 [Urine:1720; Stool:250]  Labs:  Recent Labs  09/16/13 1120 09/17/13 0405  WBC 7.8 8.0  HGB 10.1* 10.1*  HCT 29.9* 29.9*  PLT 339 331    Recent Labs  09/16/13 0334 09/17/13 0405 09/18/13 0339  NA 134* 133* 133*  K 4.0 4.1 4.2  CL 98 97 97  CO2 27 26 27   GLUCOSE 120* 131* 110*  BUN 23 21 23   CREATININE 1.00 0.98 0.98  CALCIUM 9.2 8.6 9.1  MG 1.7  --   --   PHOS 4.4  --   --   PROT 7.5 6.7 7.4  ALBUMIN 2.2* 2.0* 2.1*  AST 87* 70* 59*  ALT 90* 78* 71*  ALKPHOS 104 88 98  BILITOT 1.1 1.0 1.0    Recent Labs  09/17/13 2010 09/18/13 0013 09/18/13 0405  GLUCAP 137* 148* 110*   Insulin Requirements in the past 24 hours:   Lantus increased to 35 units qhs by MD (note Lantus requirements fluctuate recently with a few hypoglycemic events)   Required 1 unit SSI  - CBG q4h with sensitive scale coverage  TNA contains regular insulin 40 units per 3L bag (13.3 units/L)  Nutritional Goals:   Re-estimated per RD 12/2:  2370-2650 kcal/day, 160-180 grams/day protein, 3.3-3.5 L/day fluid  Clinimix E5/15 at goal rate 125 ml/hr (3L/day) with 20% lipids at 10 ml/hr to provide 150g protein and 2675 kcal daily.  Due to how much fluid can be placed in TNA bag, maximum volume is 3L.   Current Nutrition:   NPO except for ice chips, sips with meds  Clinimix E5/15 at 191ml/hr and 20% lipids at 10 ml/hr over 24 hours  IVF: NS at [facility]: 62 yo male with large abdominal  abscess with infected old Gore Tex mesh s/p I&D abscess, removal of mesh, debridement of abdominal wall and peritoneum. 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 TNA started 11/12 at 10pm.  Patient currently unable to be discharged to a facility nearby that can accommodate both TNA and drainage system.   12/13: Pt continues 24 hour continuous TPN.  Per surgery, fistula with moderate to high output (volume not recorded on 12/12).  Continuing sandostatin and TNA as patient on bowel rest.  Labs:   Glucose: Hx DM on insulin PTA. CBGs < 150  Electrolytes: Na 133 (unable to adjust in premix TNA). Other wnl.  Renal:  SCr stable/wnl. UOP = 0.48 ml/kg/hr  LFTs: AST/ALT increased (max 87/90 on 12/11) but continue to improve this AM (59/71 on 12/13).  Albumin 2.1  TGs: wnl (12/8)  Prealbumin: 10.3 (11/13), 11.1 (11/17), 11.2(11/24), 13.6 (12/1), 10.3 (12/8)  TPN Access: Triple lumen PICC placed 11/12 TPN day#: 31  Plan:   Continue Clinimix E 5/15 at 125 ml/hr with lipids 20% at 10 ml/hr over 24 hours.     Continue insulin 40 units per 3L bag ( 13.3 units/L)  TNA to contain standard multivitamins and trace elements.  Continue CBG checks q4h with sensitive SSI coverage.     Lantus  35 units QHS per MD  KCl 82mEq PO daily per MD  Continue NS at [facility] on Mondays & Thursdays.   F/u discharge plans and TNA arrangement.   Gretta Arab PharmD, BCPS Pager [phone] 09/18/2013 9:59 AM

## 2013-09-18 NOTE — Progress Notes (Signed)
Pt placed on CPAP at previous settings with 2 LPM O2 bleed in via FFM.  Pt tolerating well at this time, RT to monitor and assess as needed.

## 2013-09-18 NOTE — Progress Notes (Signed)
Patients CBG 65, rechecked 62, D50 25cc given. Patient no complaints of any discomfort , no headache or sweating noted. Will monitor.

## 2013-09-19 LAB — GLUCOSE, CAPILLARY
Glucose-Capillary: 110 mg/dL — ABNORMAL HIGH (ref 70–99)
Glucose-Capillary: 110 mg/dL — ABNORMAL HIGH (ref 70–99)
Glucose-Capillary: 120 mg/dL — ABNORMAL HIGH (ref 70–99)
Glucose-Capillary: 122 mg/dL — ABNORMAL HIGH (ref 70–99)
Glucose-Capillary: 132 mg/dL — ABNORMAL HIGH (ref 70–99)

## 2013-09-19 MED ORDER — TRACE MINERALS CR-CU-F-FE-I-MN-MO-SE-ZN IV SOLN
INTRAVENOUS | Status: AC
  Administered 2013-09-19: 18:00:00 via INTRAVENOUS
  Filled 2013-09-19: qty 3000

## 2013-09-19 MED ORDER — FAT EMULSION 20 % IV EMUL
250.0000 mL | INTRAVENOUS | Status: AC
  Administered 2013-09-19: 18:00:00 250 mL via INTRAVENOUS
  Filled 2013-09-19: qty 250

## 2013-09-19 MED ORDER — INSULIN GLARGINE 100 UNIT/ML ~~LOC~~ SOLN
30.0000 [IU] | Freq: Every day | SUBCUTANEOUS | Status: DC
  Administered 2013-09-19 – 2013-10-14 (×26): 30 [IU] via SUBCUTANEOUS
  Filled 2013-09-19 (×26): qty 0.3

## 2013-09-19 NOTE — Progress Notes (Signed)
36 Days Post-Op  Subjective: No complaints.  Objective: Vital signs in last 24 hours: Temp:  [98.1 F (36.7 C)-98.5 F (36.9 C)] 98.5 F (36.9 C) (12/14 0429) Pulse Rate:  [60-64] 60 (12/14 0429) Resp:  [16-18] 18 (12/14 0429) BP: (132-148)/(64-72) 132/69 mmHg (12/14 0429) SpO2:  [96 %-100 %] 100 % (12/14 0429) Weight:  [326 lb 11.6 oz (148.2 kg)] 326 lb 11.6 oz (148.2 kg) (12/14 0429) Last BM Date: 09/01/13  Intake/Output from previous day: 12/13 0701 - 12/14 0700 In: 5099.5 [I.V.:658; [id number] Out: 1925 [Urine:1925] Intake/Output this shift:    PE: General- In NAD Abdomen-soft, open wound granulating in, fistula at 4:00 position with green liquid output  Lab Results:   Recent Labs  09/16/13 1120 09/17/13 0405  WBC 7.8 8.0  HGB 10.1* 10.1*  HCT 29.9* 29.9*  PLT 339 331   BMET  Recent Labs  09/17/13 0405 09/18/13 0339  NA 133* 133*  K 4.1 4.2  CL 97 97  CO2 26 27  GLUCOSE 131* 110*  BUN 21 23  CREATININE 0.98 0.98  CALCIUM 8.6 9.1   PT/INR No results found for this basename: LABPROT, INR,  in the last 72 hours Comprehensive Metabolic Panel:    Component Value Date/Time   NA 133* 09/18/2013 0339   K 4.2 09/18/2013 0339   CL 97 09/18/2013 0339   CO2 27 09/18/2013 0339   BUN 23 09/18/2013 0339   CREATININE 0.98 09/18/2013 0339   GLUCOSE 110* 09/18/2013 0339   CALCIUM 9.1 09/18/2013 0339   AST 59* 09/18/2013 0339   ALT 71* 09/18/2013 0339   ALKPHOS 98 09/18/2013 0339   BILITOT 1.0 09/18/2013 0339   PROT 7.4 09/18/2013 0339   ALBUMIN 2.1* 09/18/2013 0339     Studies/Results: No results found.  Anti-infectives: Anti-infectives   Start     Dose/Rate Route Frequency Ordered Stop   08/31/13 0800  vancomycin (VANCOCIN) 1,750 mg in sodium chloride 0.9 % 500 mL IVPB  Status:  Discontinued     1,750 mg 250 mL/hr over 120 Minutes Intravenous Every 24 hours 08/31/13 0753 08/31/13 1417   08/16/13 0800  vancomycin (VANCOCIN) 2,000 mg in sodium  chloride 0.9 % 500 mL IVPB  Status:  Discontinued     2,000 mg 250 mL/hr over 120 Minutes Intravenous Every 24 hours 08/15/13 1411 08/31/13 0753   08/15/13 1500  vancomycin (VANCOCIN) 2,000 mg in sodium chloride 0.9 % 500 mL IVPB     2,000 mg 250 mL/hr over 120 Minutes Intravenous  Once 08/15/13 1411 08/15/13 1713   08/14/13 1600  piperacillin-tazobactam (ZOSYN) IVPB 3.375 g  Status:  Discontinued     3.375 g 12.5 mL/hr over 240 Minutes Intravenous Every 8 hours 08/14/13 1543 08/31/13 1417      Assessment Infected prosthetic mesh of abdominal wall with GIANT abscess s/p removal 08/14/2013 Dr. 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

## 2013-09-19 NOTE — Progress Notes (Signed)
PARENTERAL NUTRITION CONSULT NOTE - FOLLOW UP  Pharmacy Consult for TNA Indication: Enterocutaneous fistula  No Known Allergies  Patient Measurements: Height: 6' (182.9 cm) Weight: 326 lb 11.6 oz (148.2 kg) IBW/kg (Calculated) : 77.6 Adjusted Body Weight: 102.6kg  Vital Signs: Temp: 98.5 F (36.9 C) (12/14 0429) Temp src: Axillary (12/14 0429) BP: 132/69 mmHg (12/14 0429) Pulse Rate: 60 (12/14 0429) Intake/Output from previous day: 12/13 0701 - 12/14 0700 In: 5099.5 [I.V.:658; TPN:4441.5] Out: 1925 [Urine:1925]  Labs:  Recent Labs  09/16/13 1120 09/17/13 0405  WBC 7.8 8.0  HGB 10.1* 10.1*  HCT 29.9* 29.9*  PLT 339 331    Recent Labs  09/17/13 0405 09/18/13 0339  NA 133* 133*  K 4.1 4.2  CL 97 97  CO2 26 27  GLUCOSE 131* 110*  BUN 21 23  CREATININE 0.98 0.98  CALCIUM 8.6 9.1  PROT 6.7 7.4  ALBUMIN 2.0* 2.1*  AST 70* 59*  ALT 78* 71*  ALKPHOS 88 98  BILITOT 1.0 1.0    Recent Labs  09/18/13 2027 09/19/13 0018 09/19/13 0416  GLUCAP 131* 132* 122*   Insulin Requirements in the past 24 hours:   Lantus 35 units qhs (Decrease to 30 units 12/14 pm).  Note Lantus requirements fluctuate recently with a few hypoglycemic events.  Required 2 units SSI  - CBG q4h with sensitive scale coverage  TNA contains regular insulin 40 units per 3L bag (Decrease to 35 units per 3L bag on 12/14, 11.7 units/L)  Nutritional Goals:   Re-estimated per RD 12/2:  [id number] kcal/day, 160-180 grams/day protein, 3.3-3.5 L/day fluid  Clinimix E5/15 at goal rate 125 ml/hr (3L/day) with 20% lipids at 10 ml/hr to provide 150g protein and 2675 kcal daily.  Due to how much fluid can be placed in TNA bag, maximum volume is 3L.   Current Nutrition:   NPO except for ice chips, sips with meds  Clinimix E5/15 at 146ml/hr and 20% lipids at 10 ml/hr over 24 hours  IVF: NS at [place]: 62 yo male with large abdominal abscess with infected old Gore Tex mesh s/p I&D abscess,  removal of mesh, debridement of abdominal wall and peritoneum. 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 TNA started 11/12 at 10pm.  Patient currently unable to be discharged to a facility nearby that can accommodate both TNA and drainage system.   12/14: Pt continues 24 hour continuous TPN.  CBG dropped yesterday with hypoglycemic event (CBG 65 at ~ 1700) with improvement s/p D50 79mL bolus.  Per surgery, fistula with moderate to high output (volume not recorded 12/12-13).  Continuing sandostatin and TNA as patient on bowel rest.  Labs:   Glucose: Hx DM on insulin PTA. CBGs < 150 with hypoglycemic event on 12/13.  Last hypoglycemic even occurred on 09/12/13.  Insulin had not been  changed since 12/9 and TNA had no interruptions per RN.  Since that event, CBGs have been stable, plan for only small insulin decrease today.  Electrolytes: Na 133, others wnl (12/13).  Renal:  SCr stable/wnl. UOP = 0.54 ml/kg/hr  LFTs: AST/ALT increased (max 87/90 on 12/11) but continue to improve this AM (59/71 on 12/13).  Albumin 2.1  TGs: wnl (12/8)  Prealbumin: 10.3 (11/13), 11.1 (11/17), 11.2(11/24), 13.6 (12/1), 10.3 (12/8)  TPN Access: Triple lumen PICC placed 11/12 TPN day#: 32  Plan:   Continue Clinimix E 5/15 at 125 ml/hr with lipids 20% at 10 ml/hr over 24 hours.     Decrease to  insulin 35 units per 3L bag ( 11.7 units/L)  TNA to contain standard multivitamins and trace elements.  Continue CBG checks q4h with sensitive SSI coverage.     Decrease to Lantus 30 units QHS.  KCl 109mEq PO daily per MD  Continue NS at [place] on Mondays & Thursdays.   F/u discharge plans and TNA arrangement.   Gretta Arab PharmD, BCPS Pager [phone] 09/19/2013 7:47 AM

## 2013-09-19 NOTE — Progress Notes (Signed)
Pt sleeping, vitals signs and blood work reviewed. Pt appears hemodynamically stable. No changes with management. Continue bowel rest.  Faye Ramsay, MD  [facility] Pager [phone]  If 7PM-7AM, please contact night-coverage [email or web address] Password [email or web address]

## 2013-09-20 LAB — COMPREHENSIVE METABOLIC PANEL
ALT: 51 U/L (ref 0–53)
AST: 43 U/L — ABNORMAL HIGH (ref 0–37)
Albumin: 2.2 g/dL — ABNORMAL LOW (ref 3.5–5.2)
Alkaline Phosphatase: 89 U/L (ref 39–117)
CO2: 29 mEq/L (ref 19–32)
Chloride: 94 mEq/L — ABNORMAL LOW (ref 96–112)
Creatinine, Ser: 1.02 mg/dL (ref 0.50–1.35)
Potassium: 3.8 mEq/L (ref 3.5–5.1)
Sodium: 132 mEq/L — ABNORMAL LOW (ref 135–145)
Total Bilirubin: 1 mg/dL (ref 0.3–1.2)
Total Protein: 7.3 g/dL (ref 6.0–8.3)

## 2013-09-20 LAB — DIFFERENTIAL
Eosinophils Absolute: 0.2 10*3/uL (ref 0.0–0.7)
Eosinophils Relative: 2 % (ref 0–5)
Lymphocytes Relative: 31 % (ref 12–46)
Lymphs Abs: 2.7 10*3/uL (ref 0.7–4.0)
Monocytes Absolute: 1 10*3/uL (ref 0.1–1.0)
Monocytes Relative: 11 % (ref 3–12)
Neutrophils Relative %: 55 % (ref 43–77)

## 2013-09-20 LAB — CBC
HCT: 30.3 % — ABNORMAL LOW (ref 39.0–52.0)
Hemoglobin: 10.5 g/dL — ABNORMAL LOW (ref 13.0–17.0)
MCH: 31.5 pg (ref 26.0–34.0)
MCV: 91 fL (ref 78.0–100.0)
RBC: 3.33 MIL/uL — ABNORMAL LOW (ref 4.22–5.81)

## 2013-09-20 LAB — GLUCOSE, CAPILLARY
Glucose-Capillary: 104 mg/dL — ABNORMAL HIGH (ref 70–99)
Glucose-Capillary: 119 mg/dL — ABNORMAL HIGH (ref 70–99)
Glucose-Capillary: 129 mg/dL — ABNORMAL HIGH (ref 70–99)
Glucose-Capillary: 138 mg/dL — ABNORMAL HIGH (ref 70–99)
Glucose-Capillary: 155 mg/dL — ABNORMAL HIGH (ref 70–99)

## 2013-09-20 MED ORDER — TRACE MINERALS CR-CU-F-FE-I-MN-MO-SE-ZN IV SOLN
INTRAVENOUS | Status: AC
  Administered 2013-09-20: 18:00:00 via INTRAVENOUS
  Filled 2013-09-20: qty 3000

## 2013-09-20 MED ORDER — FAT EMULSION 20 % IV EMUL
250.0000 mL | INTRAVENOUS | Status: AC
  Administered 2013-09-20: 18:00:00 250 mL via INTRAVENOUS
  Filled 2013-09-20: qty 250

## 2013-09-20 MED ORDER — MAGNESIUM SULFATE 40 MG/ML IJ SOLN
2.0000 g | Freq: Once | INTRAMUSCULAR | Status: AC
  Administered 2013-09-20: 10:00:00 2 g via INTRAVENOUS
  Filled 2013-09-20: qty 50

## 2013-09-20 NOTE — Progress Notes (Signed)
Patient ID: Kurt Cross, male   DOB: 02-10-1951, 62 y.o.   MRN: [id number]  [facility] PROGRESS NOTE  JAMESMATTHEW RAPPOLD [id number] DOB: June 10, 1951 DOA: 08/11/2013 PCP: [name], MD  Brief Narrative:  Pt is 62 y.o. Male with known history of chronic systolic heart failure status post defibrillator placement last EF measured was in 2013 was 35-40%, on home oxygen, OSA noncompliant with CPAP, diabetes mellitus, hypertension and hyperlipidemia presented to the ER because right leg pain. He had been hospitalized a week prior with sepsis that was presumed to be due to UTI and discharged home. In the ER, he was found to have a leukocytosis and AKI. Abdominal CT demonstrated infected prosthetic Gore-Tex mesh. His mesh was removed and 3L of pus was drained. 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 He also has chronic a-flutter which is rate controlled. He is on anticoagulation. See below for details.   Assessment/Plan  Infected prosthetic gore-tex mesh  - with large abscess s/p I&D and removal of gore-tex on 11/8. 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  - Continue sandostatin and TNA for now  - Continuous wall suction  - no significant clinical change  Sepsis  - due to coag neg staph s/p 2 weeks of vancomycin, resolved.  HTN  - blood pressure stable  - cont coreg and lasix, hydralazine, imdur  #4 atrial fibrillation/atrial flutter  - secondary to problem #1. Now ressolved, CE x 3 neg, 2 D ECHO EF 55%  - currently rate controlled on Coreg  - CHADSVASc score is 4 (not ASVD plaque on abd aorta), resumed eliquis  #5 chronic kidney disease stage III  - stable, Cr WNL  #6 leukocytosis/fever  - resolved.  #7 chronic combined systolic and diastolic CHF  - weight stable around 150 kg or 330lbs  - clinically compensated  OSA,  - stable. Continue CPAP.  #8 Uncontrolled Pain   - continue prn oxycodone as needed  #9 hyperlipidemia  - Continue statin.  #10 4.5 cm ascending aortic aneurysm per CT scan of 08/01/2013  - Stable. Asymptomatic.  #11 type 2 diabetes  - continue Lantus and SSI  #12. Hypomagnesemia  - supplemented by TPN   Diet: NPO with ice chips  Access: TL PICC  IVF: TPN  Proph: apixaban  Code Status: full  Family Communication: patient alone  Disposition Plan: Patient is not a good candidate for SNF due to complexity of care, including need for continuous TPN and continuous wall suction. These interventions can easily be managed by an LTACH. The patient is otherwise stable and no longer meets criteria for hospitalization.   Antibiotics  Vancomycin 08/15/13>>> 08/31/13  Zosyn 08/14/13>>>08/31/13  Procedures/Studies:  Ct Abdomen Pelvis Wo Contrast 08/14/2013 14.5 x 18.1 x 26.1 cm fluid collection/abscess within the anterior abdominal wall, as described above, incompletely visualized. The collection appears just anterior to prior ventral hernia mesh. Given the size and proximity to the skin surface, incision and drainage is suggested.  Dg Chest 2 View 08/01/2013 No active cardiopulmonary disease.  Dg Hip Complete Right 08/11/2013 No acute osseous injury of the right hip.  Ct Angio Chest Pe W/cm &/or Wo Cm 08/01/2013 4.5 cm AAA without complicating features. No evidence of acute pulmonary embolism or aortic dissection.  Dg Chest Port 1 View 08/18/2013 Right upper extremity PICC terminates in the mid superior vena cava. Stable congestive heart failure pattern.  Dg Chest Port 1 View 08/17/2013 Stable  chest with changes of congestive heart failure with pulmonary interstitial edema. Cardiac pacer in stable position.  Dg Chest Portable 1 View 08/15/2013 Stable cardiomegaly with interval worsening of pulmonary vascular congestion/ edema relative to 08/13/2013.  Dg Chest Port 1 View 08/13/2013 No acute findings. Stable cardiomegaly and pulmonary venous prominence  without overt edema.  2-D echo 08/12/2013  Incision and drainage of abscess, Gore-Tex mesh removal, debridement of abdominal wall and peritoneum Consultants:  Cardiology: Dr. Terrence Dupont 08/13/2013  General surgery: Dr. Johney Maine 08/14/2013  ID: Dr Megan Salon 08/15/13  HPI/Subjective: No events overnight.   Objective: Filed Vitals:   09/19/13 1447 09/19/13 2019 09/19/13 2235 09/20/13 0418  BP: 154/82 143/71  160/62  Pulse: 60 63 61 69  Temp: 97.8 F (36.6 C) 97.3 F (36.3 C)  98.6 F (37 C)  TempSrc: Oral Oral  Oral  Resp: 16 18 20 20   Height:      Weight:      SpO2: 100% 96% 98% 98%    Intake/Output Summary (Last 24 hours) at 09/20/13 1439 Last data filed at 09/20/13 1309  Gross per 24 hour  Intake   2418 ml  Output   1725 ml  Net    693 ml    Exam:   General:  Pt is alert, follows commands appropriately, not in acute distress  Cardiovascular: Regular rate and rhythm, S1/S2, no murmurs, no rubs, no gallops  Respiratory: Clear to auscultation bilaterally, no wheezing, no crackles, no rhonchi  Abdomen: Soft, non tender, non distended, bowel sounds present, no guarding   Data Reviewed: Basic Metabolic Panel:  Recent Labs Lab 09/16/13 0334 09/17/13 0405 09/18/13 0339 09/20/13 0400  NA 134* 133* 133* 132*  K 4.0 4.1 4.2 3.8  CL 98 97 97 94*  CO2 27 26 27 29   GLUCOSE 120* 131* 110* 103*  BUN 23 21 23  24*  CREATININE 1.00 0.98 0.98 1.02  CALCIUM 9.2 8.6 9.1 8.9  MG 1.7  --   --  1.6  PHOS 4.4  --   --  4.9*   Liver Function Tests:  Recent Labs Lab 09/16/13 0334 09/17/13 0405 09/18/13 0339 09/20/13 0400  AST 87* 70* 59* 43*  ALT 90* 78* 71* 51  ALKPHOS 104 88 98 89  BILITOT 1.1 1.0 1.0 1.0  PROT 7.5 6.7 7.4 7.3  ALBUMIN 2.2* 2.0* 2.1* 2.2*   CBC:  Recent Labs Lab 09/16/13 1120 09/17/13 0405 09/20/13 0400  WBC 7.8 8.0 8.8  NEUTROABS  --   --  4.8  HGB 10.1* 10.1* 10.5*  HCT 29.9* 29.9* 30.3*  MCV 92.0 92.6 91.0  PLT 339 331 334    CBG:  Recent Labs Lab 09/19/13 2017 09/20/13 0011 09/20/13 0411 09/20/13 0814 09/20/13 1143  GLUCAP 137* 119* 104* 122* 129*    Scheduled Meds: . acetaminophen  1,000 mg Oral TID  . antiseptic oral rinse  15 mL Mouth Rinse q12n4p  . apixaban  5 mg Oral BID  . atorvastatin  40 mg Oral QODAY  . carvedilol  3.125 mg Oral BID WC  . ferrous sulfate  325 mg Oral BID  . furosemide  40 mg Oral Daily  . hydrALAZINE  50 mg Oral Q8H  . insulin aspart  0-9 Units Subcutaneous Q4H  . insulin glargine  30 Units Subcutaneous QHS  . isosorbide mononitrate  15 mg Oral Daily  . lip balm  1 application Topical BID  . octreotide  150 mcg Subcutaneous TID  . OxyCODONE  15 mg  Oral Q12H  . potassium chloride  40 mEq Oral Daily  . saccharomyces boulardii  250 mg Oral BID  . sodium chloride  3 mL Intravenous Q12H  . zolpidem  10 mg Oral QHS   Continuous Infusions: . sodium chloride 20 mL/hr at 09/20/13 1353  . [name] KitchenTPN (CLINIMIX-E) Adult 125 mL/hr at 09/19/13 1803   And  . fat emulsion 250 mL (09/19/13 1804)  . [name] KitchenTPN (CLINIMIX-E) Adult     And  . fat emulsion     Faye Ramsay, MD  [facility] Pager [phone]  If 7PM-7AM, please contact night-coverage [email or web address] Password [email or web address] 09/20/2013, 2:39 PM   LOS: 40 days

## 2013-09-20 NOTE — Progress Notes (Signed)
CSW continues to follow for snf in case continuous suction changes to intermittent.  Kenda Kloehn C. Cuba MSW, [place]

## 2013-09-20 NOTE — Progress Notes (Signed)
PARENTERAL NUTRITION CONSULT NOTE - FOLLOW UP  Pharmacy Consult for TNA Indication: Enterocutaneous fistula  No Known Allergies  Patient Measurements: Height: 6' (182.9 cm) Weight: 326 lb 11.6 oz (148.2 kg) IBW/kg (Calculated) : 77.6 Adjusted Body Weight: 102.6kg  Vital Signs: Temp: 98.6 F (37 C) (12/15 0418) Temp src: Oral (12/15 0418) BP: 160/62 mmHg (12/15 0418) Pulse Rate: 69 (12/15 0418) Intake/Output from previous day: 12/14 0701 - 12/15 0700 In: 2418 [I.V.:312; TPN:2106] Out: 2025 [Urine:1625; Stool:400]  Labs:  Recent Labs  09/20/13 0400  WBC 8.8  HGB 10.5*  HCT 30.3*  PLT 334    Recent Labs  09/18/13 0339 09/20/13 0400  NA 133* 132*  K 4.2 3.8  CL 97 94*  CO2 27 29  GLUCOSE 110* 103*  BUN 23 24*  CREATININE 0.98 1.02  CALCIUM 9.1 8.9  MG  --  1.6  PHOS  --  4.9*  PROT 7.4 7.3  ALBUMIN 2.1* 2.2*  AST 59* 43*  ALT 71* 51  ALKPHOS 98 89  BILITOT 1.0 1.0    Recent Labs  09/19/13 2017 09/20/13 0011 09/20/13 0411  GLUCAP 137* 119* 104*   Insulin Requirements in the past 24 hours:   Lantus 30 units qhs.  Note Lantus requirements fluctuate recently with a few hypoglycemic events.  Required 2 units SSI  - CBG q4h with sensitive scale coverage TNA contains regular insulin 35 units per 3L bag (11.7 units/L)  Nutritional Goals:   Re-estimated per RD 12/2:  2370-2650 kcal/day, 160-180 grams/day protein, 3.3-3.5 L/day fluid  Clinimix E5/15 at goal rate 125 ml/hr (3L/day) with 20% lipids at 10 ml/hr to provide 150g protein and 2675 kcal daily.  Due to how much fluid can be placed in TNA bag, maximum volume is 3L.   Current Nutrition:   NPO except for ice chips, sips with meds  Clinimix E5/15 at 172ml/hr and 20% lipids at 10 ml/hr over 24 hours  IVF: NS at [place]: 62 yo male with large abdominal abscess with infected old Gore Tex mesh s/p I&D abscess, removal of mesh, debridement of abdominal wall and peritoneum. Now with new  small bowel enterocutaneous fistula. TNA started 11/12 at 10pm.  Patient currently unable to be discharged to a facility nearby that can accommodate both TNA and drainage system.   12/15: Pt continues 24 hour continuous TPN.  Low CBGs improved with insulin adjustment.  Per surgery, fistula with moderate to high output (volume not recorded 12/12-13).  Continuing sandostatin and TNA as patient on bowel rest.  Labs:   Glucose: Hx DM on insulin PTA. CBGs at goal, hypoglycemia resolved.   Electrolytes: Na 132, Mg = 1.6 (trending down), Phos = 4.9 (slightly elevated), Corr Ca = 10.34 (Ca x Phos = 51)  Renal:  SCr stable/wnl.  LFTs: AST increased but improving, ALT WNL.  Albumin 2.2  TGs: wnl (12/8)  Prealbumin: 10.3 (11/13), 11.1 (11/17), 11.2(11/24), 13.6 (12/1), 10.3 (12/8)  TPN Access: Triple lumen PICC placed 11/12 TPN day#: 33  Plan:   Continue Clinimix E 5/15 at 125 ml/hr with lipids 20% at 10 ml/hr over 24 hours.     Continue insulin 35 units per 3L bag ( 11.7 units/L)  TNA to contain standard multivitamins and trace elements.  Continue CBG checks q4h with sensitive SSI coverage.     Continue Lantus 30 units QHS.  Magnesium 2gm IVPB x 1  KCl 29mEq PO daily per MD  Continue NS at [facility]  TNA lab panels  on Mondays & Thursdays.   F/u discharge plans and TNA arrangement.  Doreene Eland, PharmD, BCPS.   Pager: [phone] 09/20/2013 7:20 AM

## 2013-09-20 NOTE — Progress Notes (Signed)
Patient ID: Kurt Cross, male   DOB: [birth year], 62 y.o.   MRN: [id number] 37 Days Post-Op  Subjective: Pt feels well today.  WOC changed [place] over the weekend and felt that the wound was closing down.  His output is a little all over the place.  Some days its down and some days it's back up.  I think a lot of this depends on how well the pouch is working.  Objective: Vital signs in last 24 hours: Temp:  [97.3 F (36.3 C)-98.6 F (37 C)] 98.6 F (37 C) (12/15 0418) Pulse Rate:  [60-69] 69 (12/15 0418) Resp:  [16-20] 20 (12/15 0418) BP: (143-160)/(62-82) 160/62 mmHg (12/15 0418) SpO2:  [96 %-100 %] 98 % (12/15 0418) Last BM Date: 09/01/13  Intake/Output from previous day: 12/14 0701 - 12/15 0700 In: 2418 [I.V.:312; TPN:2106] Out: 2025 [Urine:1625; Stool:400] Intake/Output this shift: Total I/O In: -  Out: 600 [Urine:600]  PE: Abd: soft, stable, Eakin's pouch in place with enteric contents draining.  250cc so far today from 7am this morning.  Lab Results:   Recent Labs  09/20/13 0400  WBC 8.8  HGB 10.5*  HCT 30.3*  PLT 334   BMET  Recent Labs  09/18/13 0339 09/20/13 0400  NA 133* 132*  K 4.2 3.8  CL 97 94*  CO2 27 29  GLUCOSE 110* 103*  BUN 23 24*  CREATININE 0.98 1.02  CALCIUM 9.1 8.9   PT/INR No results found for this basename: LABPROT, INR,  in the last 72 hours CMP     Component Value Date/Time   NA 132* 09/20/2013 0400   K 3.8 09/20/2013 0400   CL 94* 09/20/2013 0400   CO2 29 09/20/2013 0400   GLUCOSE 103* 09/20/2013 0400   BUN 24* 09/20/2013 0400   CREATININE 1.02 09/20/2013 0400   CALCIUM 8.9 09/20/2013 0400   PROT 7.3 09/20/2013 0400   ALBUMIN 2.2* 09/20/2013 0400   AST 43* 09/20/2013 0400   ALT 51 09/20/2013 0400   ALKPHOS 89 09/20/2013 0400   BILITOT 1.0 09/20/2013 0400   GFRNONAA 78* 09/20/2013 0400   GFRAA >90 09/20/2013 0400   Lipase  No results found for this basename: lipase       Studies/Results: No results  found.  Anti-infectives: Anti-infectives   Start     Dose/Rate Route Frequency Ordered Stop   08/31/13 0800  vancomycin (VANCOCIN) 1,750 mg in sodium chloride 0.9 % 500 mL IVPB  Status:  Discontinued     1,750 mg 250 mL/hr over 120 Minutes Intravenous Every 24 hours 08/31/13 0753 08/31/13 1417   08/16/13 0800  vancomycin (VANCOCIN) 2,000 mg in sodium chloride 0.9 % 500 mL IVPB  Status:  Discontinued     2,000 mg 250 mL/hr over 120 Minutes Intravenous Every 24 hours 08/15/13 1411 08/31/13 0753   08/15/13 1500  vancomycin (VANCOCIN) 2,000 mg in sodium chloride 0.9 % 500 mL IVPB     2,000 mg 250 mL/hr over 120 Minutes Intravenous  Once 08/15/13 1411 08/15/13 1713   08/14/13 1600  piperacillin-tazobactam (ZOSYN) IVPB 3.375 g  Status:  Discontinued     3.375 g 12.5 mL/hr over 240 Minutes Intravenous Every 8 hours 08/14/13 1543 08/31/13 1417       Assessment/Plan POD 37   Infected prosthetic mesh of abdominal wall with GIANT abscess s/p removal 08/14/2013 Dr. 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  Diabetes  mellitus, insulin dependent (IDDM), uncontrolled  HYPERLIPIDEMIA  Obesity, Class III, BMI 40-49.9 (morbid obesity)  OBSTRUCTIVE SLEEP APNEA  Acute renal insufficiency from fistula/wound loss-improved  Deconditioning  Plan: 1. Cont current plan.  Cont TNA. Cont NPO 2. Would like a better idea of trend of output before making decision on changing suction back to intermittent.  LOS: 40 days    Madoline Bhatt E 09/20/2013, 11:20 AM Pager: [phone]

## 2013-09-20 NOTE — Progress Notes (Signed)
No complaints  Obese, eakin pouch with suction in place. Wound is smaller since last I saw it in mid November. Fistula at 4 o'clock.   Plan per PA as above  Leighton Ruff. Redmond Pulling, MD, FACS General, Bariatric, & Minimally Invasive Surgery [facility], [place]

## 2013-09-21 LAB — GLUCOSE, CAPILLARY
Glucose-Capillary: 114 mg/dL — ABNORMAL HIGH (ref 70–99)
Glucose-Capillary: 143 mg/dL — ABNORMAL HIGH (ref 70–99)

## 2013-09-21 MED ORDER — NEOMYCIN-POLYMYXIN-PRAMOXINE 1 % EX CREA: TOPICAL_CREAM | Freq: Two times a day (BID) | CUTANEOUS | Status: DC

## 2013-09-21 MED ORDER — BACITRACIN-NEOMYCIN-POLYMYXIN OINTMENT TUBE
TOPICAL_OINTMENT | Freq: Two times a day (BID) | CUTANEOUS | Status: DC
  Administered 2013-09-21 – 2013-09-26 (×10): via TOPICAL
  Administered 2013-09-26: 1 via TOPICAL
  Administered 2013-09-27 – 2013-10-04 (×15): via TOPICAL
  Administered 2013-10-05: 1 via TOPICAL
  Administered 2013-10-05 – 2013-10-11 (×13): via TOPICAL
  Administered 2013-10-12 (×2): 1 via TOPICAL
  Administered 2013-10-13 – 2013-10-15 (×5): via TOPICAL
  Filled 2013-09-21: qty 28
  Filled 2013-09-21: qty 15
  Filled 2013-09-21: qty 28
  Filled 2013-09-21: qty 15

## 2013-09-21 MED ORDER — TRACE MINERALS CR-CU-F-FE-I-MN-MO-SE-ZN IV SOLN
INTRAVENOUS | Status: AC
  Administered 2013-09-21: 17:00:00 via INTRAVENOUS
  Filled 2013-09-21: qty 3000

## 2013-09-21 MED ORDER — FAT EMULSION 20 % IV EMUL
250.0000 mL | INTRAVENOUS | Status: AC
  Administered 2013-09-21: 17:00:00 250 mL via INTRAVENOUS
  Filled 2013-09-21: qty 250

## 2013-09-21 NOTE — Progress Notes (Signed)
Patient ID: Kurt Cross, male   DOB: Mar 26, 1951, 62 y.o.   MRN: [id number] [facility] PROGRESS NOTE  Kurt Cross [id number] DOB: August 31, 1951 DOA: 08/11/2013 PCP: [name], MD  Brief Narrative:  Pt is 62 y.o. Male with known history of chronic systolic heart failure status post defibrillator placement last EF measured was in 2013 was 35-40%, on home oxygen, OSA noncompliant with CPAP, diabetes mellitus, hypertension and hyperlipidemia presented to the ER because right leg pain. He had been hospitalized a week prior with sepsis that was presumed to be due to UTI and discharged home. In the ER, he was found to have a leukocytosis and AKI. Abdominal CT demonstrated infected prosthetic Gore-Tex mesh. His mesh was removed and 3L of pus was drained. 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 He also has chronic a-flutter which is rate controlled. He is on anticoagulation. See below for details.   Assessment/Plan  Infected prosthetic gore-tex mesh  - with large abscess s/p I&D and removal of gore-tex on 11/8. 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  - Continue sandostatin and TNA for now  - Continuous wall suction  - no significant clinical change  - pt will remain in the hospital for now as unable to go to LTAC in [place] and no available beds in [facility] Sepsis  - due to coag neg staph s/p 2 weeks of vancomycin, resolved.  - off ABX  HTN  - blood pressure stable  - cont coreg and lasix, hydralazine, imdur  #4 atrial fibrillation/atrial flutter  - secondary to problem #1. Now ressolved, CE x 3 neg, 2 D ECHO EF 55%  - currently rate controlled on Coreg  - CHADSVASc score is 4 (not ASVD plaque on abd aorta), resumed eliquis  #5 chronic kidney disease stage III  - stable, Cr WNL  #6 leukocytosis/fever  - resolved.  #7 chronic combined systolic and diastolic CHF  -  weight stable around 150 kg or 330lbs  - clinically compensated  OSA,  - stable. Continue CPAP.  #8 Uncontrolled Pain  - continue prn oxycodone as needed  #9 hyperlipidemia  - Continue statin.  #10 4.5 cm ascending aortic aneurysm per CT scan of 08/01/2013  - Stable. Asymptomatic.  #11 type 2 diabetes  - continue Lantus and SSI  #12. Hypomagnesemia  - supplemented by TPN   Diet: NPO with ice chips  Access: TL PICC  IVF: TPN  Proph: apixaban   Code Status: full  Family Communication: patient alone  Disposition Plan: Patient is not a good candidate for SNF due to complexity of care, including need for continuous TPN and continuous wall suction. These interventions can easily be managed by an LTACH. The patient is otherwise stable but no bed available at [facility] and pt refusing to go to [facility] in [place].    Antibiotics  Vancomycin 08/15/13>>> 08/31/13  Zosyn 08/14/13>>>08/31/13  Procedures/Studies:  Ct Abdomen Pelvis Wo Contrast 08/14/2013 14.5 x 18.1 x 26.1 cm fluid collection/abscess within the anterior abdominal wall, as described above, incompletely visualized. The collection appears just anterior to prior ventral hernia mesh. Given the size and proximity to the skin surface, incision and drainage is suggested.  Dg Chest 2 View 08/01/2013 No active cardiopulmonary disease.  Dg Hip Complete Right 08/11/2013 No acute osseous injury of the right hip.  Ct Angio Chest Pe W/cm &/or Wo Cm 08/01/2013 4.5 cm AAA without complicating features. No evidence of acute  pulmonary embolism or aortic dissection.  Dg Chest Port 1 View 08/18/2013 Right upper extremity PICC terminates in the mid superior vena cava. Stable congestive heart failure pattern.  Dg Chest Port 1 View 08/17/2013 Stable chest with changes of congestive heart failure with pulmonary interstitial edema. Cardiac pacer in stable position.  Dg Chest Portable 1 View 08/15/2013 Stable cardiomegaly with interval worsening of pulmonary  vascular congestion/ edema relative to 08/13/2013.  Dg Chest Port 1 View 08/13/2013 No acute findings. Stable cardiomegaly and pulmonary venous prominence without overt edema.  2-D echo 08/12/2013  Incision and drainage of abscess, Gore-Tex mesh removal, debridement of abdominal wall and peritoneum Consultants:  Cardiology: Dr. Terrence Dupont 08/13/2013 - signed off  General surgery:  ID: Dr Megan Salon 08/15/13 -- signed off   HPI/Subjective: No events overnight.   Objective: Filed Vitals:   09/20/13 2014 09/20/13 2311 09/21/13 0415 09/21/13 1408  BP: 150/67  137/69 134/63  Pulse: 60 68 62 60  Temp: 98.3 F (36.8 C)  98.4 F (36.9 C) 98.4 F (36.9 C)  TempSrc: Oral  Oral Oral  Resp: 20 18 18 18   Height:      Weight:   148 kg (326 lb 4.5 oz)   SpO2: 97% 96% 97% 95%    Intake/Output Summary (Last 24 hours) at 09/21/13 1843 Last data filed at 09/21/13 1842  Gross per 24 hour  Intake   3020 ml  Output   1000 ml  Net   2020 ml    Exam:   General:  Pt is alert, follows commands appropriately, not in acute distress  Cardiovascular: Regular rate and rhythm, S1/S2, no murmurs, no rubs, no gallops  Respiratory: Clear to auscultation bilaterally, no wheezing, no crackles, no rhonchi  Abdomen: Soft, non tender, non distended, open abdominal wound with drainage   Extremities: No edema, pulses DP and PT palpable bilaterally  Neuro: Grossly nonfocal  Data Reviewed: Basic Metabolic Panel:  Recent Labs Lab 09/16/13 0334 09/17/13 0405 09/18/13 0339 09/20/13 0400  NA 134* 133* 133* 132*  K 4.0 4.1 4.2 3.8  CL 98 97 97 94*  CO2 27 26 27 29   GLUCOSE 120* 131* 110* 103*  BUN 23 21 23  24*  CREATININE 1.00 0.98 0.98 1.02  CALCIUM 9.2 8.6 9.1 8.9  MG 1.7  --   --  1.6  PHOS 4.4  --   --  4.9*   Liver Function Tests:  Recent Labs Lab 09/16/13 0334 09/17/13 0405 09/18/13 0339 09/20/13 0400  AST 87* 70* 59* 43*  ALT 90* 78* 71* 51  ALKPHOS 104 88 98 89  BILITOT 1.1 1.0 1.0  1.0  PROT 7.5 6.7 7.4 7.3  ALBUMIN 2.2* 2.0* 2.1* 2.2*   CBC:  Recent Labs Lab 09/16/13 1120 09/17/13 0405 09/20/13 0400  WBC 7.8 8.0 8.8  NEUTROABS  --   --  4.8  HGB 10.1* 10.1* 10.5*  HCT 29.9* 29.9* 30.3*  MCV 92.0 92.6 91.0  PLT 339 331 334   CBG:  Recent Labs Lab 09/20/13 2357 09/21/13 0412 09/21/13 0819 09/21/13 1214 09/21/13 1530  GLUCAP 120* 114* 116* 149* 182*    Scheduled Meds: . acetaminophen  1,000 mg Oral TID  . antiseptic oral rinse  15 mL Mouth Rinse q12n4p  . apixaban  5 mg Oral BID  . atorvastatin  40 mg Oral QODAY  . carvedilol  3.125 mg Oral BID WC  . ferrous sulfate  325 mg Oral BID  . furosemide  40 mg Oral Daily  .  hydrALAZINE  50 mg Oral Q8H  . insulin aspart  0-9 Units Subcutaneous Q4H  . insulin glargine  30 Units Subcutaneous QHS  . isosorbide mononitrate  15 mg Oral Daily  . lip balm  1 application Topical BID  . octreotide  150 mcg Subcutaneous TID  . OxyCODONE  15 mg Oral Q12H  . potassium chloride  40 mEq Oral Daily  . saccharomyces boulardii  250 mg Oral BID  . sodium chloride  3 mL Intravenous Q12H  . zolpidem  10 mg Oral QHS   Continuous Infusions: . sodium chloride 20 mL/hr at 09/20/13 1353  . [name] KitchenTPN (CLINIMIX-E) Adult 125 mL/hr at 09/21/13 1716   And  . fat emulsion 250 mL (09/21/13 1716)   Faye Ramsay, MD  Accident Pager [phone]  If 7PM-7AM, please contact night-coverage [email or web address] Password [facility] 09/21/2013, 6:43 PM   LOS: 41 days

## 2013-09-21 NOTE — Progress Notes (Signed)
Patient ID: Kurt Cross, male   DOB: 1951/09/13, 62 y.o.   MRN: [id number] 38 Days Post-Op  Subjective: Pt feels ok today.  Nothing new  Objective: Vital signs in last 24 hours: Temp:  [98.3 F (36.8 C)-98.6 F (37 C)] 98.4 F (36.9 C) (12/16 0415) Pulse Rate:  [60-70] 62 (12/16 0415) Resp:  [18-20] 18 (12/16 0415) BP: (137-150)/(67-72) 137/69 mmHg (12/16 0415) SpO2:  [96 %-99 %] 97 % (12/16 0415) Weight:  [326 lb 4.5 oz (148 kg)] 326 lb 4.5 oz (148 kg) (12/16 0415) Last BM Date: 09/01/13  Intake/Output from previous day: 12/15 0701 - 12/16 0700 In: 3720 [I.V.:480; TPN:3240] Out: 1800 [Urine:1500; Stool:300] Intake/Output this shift: Total I/O In: -  Out: 500 [Urine:500]  PE: Abd: stable, eakin's in place with enteric output  Lab Results:   Recent Labs  09/20/13 0400  WBC 8.8  HGB 10.5*  HCT 30.3*  PLT 334   BMET  Recent Labs  09/20/13 0400  NA 132*  K 3.8  CL 94*  CO2 29  GLUCOSE 103*  BUN 24*  CREATININE 1.02  CALCIUM 8.9   PT/INR No results found for this basename: LABPROT, INR,  in the last 72 hours CMP     Component Value Date/Time   NA 132* 09/20/2013 0400   K 3.8 09/20/2013 0400   CL 94* 09/20/2013 0400   CO2 29 09/20/2013 0400   GLUCOSE 103* 09/20/2013 0400   BUN 24* 09/20/2013 0400   CREATININE 1.02 09/20/2013 0400   CALCIUM 8.9 09/20/2013 0400   PROT 7.3 09/20/2013 0400   ALBUMIN 2.2* 09/20/2013 0400   AST 43* 09/20/2013 0400   ALT 51 09/20/2013 0400   ALKPHOS 89 09/20/2013 0400   BILITOT 1.0 09/20/2013 0400   GFRNONAA 78* 09/20/2013 0400   GFRAA >90 09/20/2013 0400   Lipase  No results found for this basename: lipase       Studies/Results: No results found.  Anti-infectives: Anti-infectives   Start     Dose/Rate Route Frequency Ordered Stop   08/31/13 0800  vancomycin (VANCOCIN) 1,750 mg in sodium chloride 0.9 % 500 mL IVPB  Status:  Discontinued     1,750 mg 250 mL/hr over 120 Minutes Intravenous Every 24 hours  08/31/13 0753 08/31/13 1417   08/16/13 0800  vancomycin (VANCOCIN) 2,000 mg in sodium chloride 0.9 % 500 mL IVPB  Status:  Discontinued     2,000 mg 250 mL/hr over 120 Minutes Intravenous Every 24 hours 08/15/13 1411 08/31/13 0753   08/15/13 1500  vancomycin (VANCOCIN) 2,000 mg in sodium chloride 0.9 % 500 mL IVPB     2,000 mg 250 mL/hr over 120 Minutes Intravenous  Once 08/15/13 1411 08/15/13 1713   08/14/13 1600  piperacillin-tazobactam (ZOSYN) IVPB 3.375 g  Status:  Discontinued     3.375 g 12.5 mL/hr over 240 Minutes Intravenous Every 8 hours 08/14/13 1543 08/31/13 1417       Assessment/Plan   POD 38  Infected prosthetic mesh of abdominal wall with GIANT abscess s/p removal 08/14/2013 Dr. 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  Diabetes mellitus, insulin dependent (IDDM), uncontrolled  HYPERLIPIDEMIA  Obesity, Class III, BMI 40-49.9 (morbid obesity)  OBSTRUCTIVE SLEEP APNEA  Acute renal insufficiency from fistula/wound loss-improved  Deconditioning  Plan: 1. I have discussed this patient with SW who states that he has a bed available if we could get him to intermittent  suction for his eakin's pouch.  Several concerns were raised by [place] when d/w her.  I will d/w Dr. Lucia Gaskins and hopefully we can figure out something so the patient can be discharged soon, but well taken care of.  LOS: 41 days    OSBORNE,KELLY E 09/21/2013, 12:21 PM Pager: [phone]  Agree with above. One of the main issues about discharge are the management of his abdominal wound.  An LTAC would be a good solution, but the only offer for an LTAC has come from [place] and the patient has refused to go that far away.  SNF require intermittent suction - which is a wound management issue and we think that continuous suction is important for continued wound healing. So, for now, the patient appears  to be here.  Alphonsa Overall, MD, [facility] Pager: [phone] Office phone:  [phone]

## 2013-09-21 NOTE — Progress Notes (Signed)
PARENTERAL NUTRITION CONSULT NOTE - FOLLOW UP  Pharmacy Consult for TNA Indication: Enterocutaneous fistula  No Known Allergies  Patient Measurements: Height: 6' (182.9 cm) Weight: 326 lb 4.5 oz (148 kg) IBW/kg (Calculated) : 77.6 Adjusted Body Weight: 102.6kg  Vital Signs: Temp: 98.4 F (36.9 C) (12/16 0415) Temp src: Oral (12/16 0415) BP: 137/69 mmHg (12/16 0415) Pulse Rate: 62 (12/16 0415) Intake/Output from previous day: 12/15 0701 - 12/16 0700 In: 1870.3 [I.V.:241.3; TPN:1629] Out: 1700 [Urine:1500; Stool:200]  Labs:  Recent Labs  09/20/13 0400  WBC 8.8  HGB 10.5*  HCT 30.3*  PLT 334    Recent Labs  09/20/13 0400  NA 132*  K 3.8  CL 94*  CO2 29  GLUCOSE 103*  BUN 24*  CREATININE 1.02  CALCIUM 8.9  MG 1.6  PHOS 4.9*  PROT 7.3  ALBUMIN 2.2*  AST 43*  ALT 51  ALKPHOS 89  BILITOT 1.0  PREALBUMIN 13.4*  TRIG 101    Recent Labs  09/20/13 2007 09/20/13 2357 09/21/13 0412  GLUCAP 155* 120* 114*   Insulin Requirements in the past 24 hours:   Lantus 30 units qhs.  Note Lantus requirements fluctuate recently with a few hypoglycemic events.  Required 5 units SSI  - CBG q4h with sensitive scale coverage TNA contains regular insulin 35 units per 3L bag (11.7 units/L)  Nutritional Goals:   Re-estimated per RD 12/2:  2370-2650 kcal/day, 160-180 grams/day protein, 3.3-3.5 L/day fluid  Clinimix E5/15 at goal rate 125 ml/hr (3L/day) with 20% lipids at 10 ml/hr to provide 150g protein and 2675 kcal daily.  Due to how much fluid can be placed in TNA bag, maximum volume is 3L.   Current Nutrition:   NPO except for ice chips, sips with meds  Clinimix E5/15 at 1103ml/hr and 20% lipids at 10 ml/hr over 24 hours  IVF: NS at [place]: 62 yo male with large abdominal abscess with infected old Gore Tex mesh s/p I&D abscess, removal of mesh, debridement of abdominal wall and peritoneum. 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 TNA started  11/12 at 10pm.  Patient currently unable to be discharged to a facility nearby that can accommodate both TNA and drainage system.   12/16: Pt continues 24 hour continuous TPN.  Per surgery, fistula with moderate to high output (volume not recorded).  Per surgery note 12/15, wound decreasing in size. Continuing sandostatin and TNA as patient on bowel rest.  Labs:   Glucose: Hx DM on insulin PTA. CBGs at goal, hypoglycemia resolved.   Electrolytes: No new labs this am. Na 132, Mg = 1.6 (trending down), Phos = 4.9 (slightly elevated), Corr Ca = 10.34 (Ca x Phos = 51)  Renal:  SCr stable/wnl.  LFTs: AST increased but improving, ALT WNL.  Albumin 2.2  TGs: 101 (12/15)  Prealbumin: 10.3 (11/13), 11.1 (11/17), 11.2(11/24), 13.6 (12/1), 10.3 (12/8), 13.4 (12/15)  TPN Access: Triple lumen PICC placed 11/12 TPN day#: 34  Plan:   Continue Clinimix E 5/15 at 125 ml/hr with lipids 20% at 10 ml/hr over 24 hours.     Continue insulin 35 units per 3L bag ( 11.7 units/L)  TNA to contain standard multivitamins and trace elements.  Continue CBG checks q4h with sensitive SSI coverage.     Continue Lantus 30 units QHS.  KCl 63mEq PO daily per MD  Continue NS at [place] on Mondays & Thursdays.   F/u discharge plans and TNA arrangement.  Doreene Eland,  PharmD, BCPS.   Pager: [phone] 09/21/2013 7:23 AM

## 2013-09-21 NOTE — Progress Notes (Signed)
PT Cancellation Note  Patient Details Name: Kurt Cross MRN: [id number] DOB: 11/18/1950   Cancelled Treatment:    Reason Eval/Treat Not Completed: Patient declined,  Wants to get his bath and wioll walk with nursing later.  Claretha Cooper 09/21/2013, 4:56 PM

## 2013-09-21 NOTE — Progress Notes (Signed)
NUTRITION FOLLOW UP  Intervention:   Continue TPN per Pharmacy Diet advancement per MD discretion RD to continue monitor  Nutrition Dx:   Inadequate oral intake related to EC fistula as evidenced by NPO status; ongoing  Goal:   Pt to meet >/= 90% of their estimated nutrition needs; being met by TPN  Monitor:   TPN rate; clinamix E 5/15 @125  ml/hr 24 hours per day Weight; 18 lbs weight loss since admission Labs; Blood glucose ranging 104-155 mg/dL, low sodium, potassium WNL, low albumin, magnesium WNL, high phosphorus, high BUN, Triglycerides still WNL (12/15), low hemoglobin, low prealbumin trending back up Diet advancement; none  Assessment:   62 y.o. male with known history of chronic systolic heart failure status post defibrillator placement last EF measured was in 2013 was 35-40%, on home oxygen, OSA noncompliant with CPAP, diabetes mellitus, hypertension and hyperlipidemia. CT scan revealed a large abdominal wall abscess.  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 TNA started 11/12 at 10pm.  Clinimix E5/15 at goal rate 125 ml/hr (3L/day) with 20% lipids at 10 ml/hr to provide 150 g protein and 2675 kcal daily. This meets 94% of estimated protein needs and 101% of estimated energy needs. Pt continues to lose weight with 5 lb weight loss in past 2 weeks and 18 lb weight loss since 08/12/13. Pt has lost 2% of his body weight in the past month- not significant. Per chart pt will likely be discharged with TNA.  RD will continue to monitor weight trends and re-estimate energy needs as needed.   Height: Ht Readings from Last 1 Encounters:  08/14/13 6' (1.829 m)    Weight Status:   Wt Readings from Last 1 Encounters:  09/21/13 326 lb 4.5 oz (148 kg)    Re-estimated needs:  Kcal: 2370-2650 Protein: 160-180 grams Fluid: 3.3-3.5 L/day  Skin: +1 Generalized edema, non-pitting RLE and LLE edema; abdominal incisions/wounds with closed system drains  Diet Order:  NPO   Intake/Output Summary (Last 24 hours) at 09/21/13 1311 Last data filed at 09/21/13 1215  Gross per 24 hour  Intake   3720 ml  Output   1500 ml  Net   2220 ml    Last BM: 11/26   Labs:   Recent Labs Lab 09/16/13 0334 09/17/13 0405 09/18/13 0339 09/20/13 0400  NA 134* 133* 133* 132*  K 4.0 4.1 4.2 3.8  CL 98 97 97 94*  CO2 27 26 27 29   BUN 23 21 23  24*  CREATININE 1.00 0.98 0.98 1.02  CALCIUM 9.2 8.6 9.1 8.9  MG 1.7  --   --  1.6  PHOS 4.4  --   --  4.9*  GLUCOSE 120* 131* 110* 103*    CBG (last 3)   Recent Labs  09/21/13 0412 09/21/13 0819 09/21/13 1214  GLUCAP 114* 116* 149*    Scheduled Meds: . acetaminophen  1,000 mg Oral TID  . antiseptic oral rinse  15 mL Mouth Rinse q12n4p  . apixaban  5 mg Oral BID  . atorvastatin  40 mg Oral QODAY  . carvedilol  3.125 mg Oral BID WC  . ferrous sulfate  325 mg Oral BID  . furosemide  40 mg Oral Daily  . hydrALAZINE  50 mg Oral Q8H  . insulin aspart  0-9 Units Subcutaneous Q4H  . insulin glargine  30 Units Subcutaneous QHS  . isosorbide mononitrate  15 mg Oral Daily  . lip balm  1 application Topical BID  . octreotide  150  mcg Subcutaneous TID  . OxyCODONE  15 mg Oral Q12H  . potassium chloride  40 mEq Oral Daily  . saccharomyces boulardii  250 mg Oral BID  . sodium chloride  3 mL Intravenous Q12H  . zolpidem  10 mg Oral QHS    Continuous Infusions: . sodium chloride 20 mL/hr at 09/20/13 1353  . [name] KitchenTPN (CLINIMIX-E) Adult 125 mL/hr at 09/20/13 1817   And  . fat emulsion 250 mL (09/20/13 1817)  . [name] KitchenTPN (CLINIMIX-E) Adult     And  . fat emulsion      Pryor Ochoa RD, LDN Inpatient Clinical Dietitian Pager: [phone] After Hours Pager: [phone]

## 2013-09-21 NOTE — Progress Notes (Signed)
Report received from C. Kari Baars RN, agree with am assessment.

## 2013-09-22 LAB — BASIC METABOLIC PANEL
CO2: 29 mEq/L (ref 19–32)
Calcium: 9 mg/dL (ref 8.4–10.5)
Chloride: 98 mEq/L (ref 96–112)
Creatinine, Ser: 1.03 mg/dL (ref 0.50–1.35)
GFR calc Af Amer: 89 mL/min — ABNORMAL LOW (ref >90)
Glucose, Bld: 116 mg/dL — ABNORMAL HIGH (ref 70–99)
Potassium: 3.9 mEq/L (ref 3.5–5.1)
Sodium: 134 mEq/L — ABNORMAL LOW (ref 135–145)

## 2013-09-22 LAB — CBC
Hemoglobin: 10.3 g/dL — ABNORMAL LOW (ref 13.0–17.0)
MCH: 30.8 pg (ref 26.0–34.0)
MCV: 91.9 fL (ref 78.0–100.0)
Platelets: 314 10*3/uL (ref 150–400)
RBC: 3.34 MIL/uL — ABNORMAL LOW (ref 4.22–5.81)
RDW: 14 % (ref 11.5–15.5)
WBC: 7.6 10*3/uL (ref 4.0–10.5)

## 2013-09-22 LAB — GLUCOSE, CAPILLARY
Glucose-Capillary: 109 mg/dL — ABNORMAL HIGH (ref 70–99)
Glucose-Capillary: 132 mg/dL — ABNORMAL HIGH (ref 70–99)
Glucose-Capillary: 163 mg/dL — ABNORMAL HIGH (ref 70–99)
Glucose-Capillary: 165 mg/dL — ABNORMAL HIGH (ref 70–99)

## 2013-09-22 MED ORDER — FAT EMULSION 20 % IV EMUL
250.0000 mL | INTRAVENOUS | Status: AC
  Administered 2013-09-22: 250 mL via INTRAVENOUS
  Filled 2013-09-22: qty 250

## 2013-09-22 MED ORDER — TRACE MINERALS CR-CU-F-FE-I-MN-MO-SE-ZN IV SOLN
INTRAVENOUS | Status: AC
  Administered 2013-09-22: 17:00:00 via INTRAVENOUS
  Filled 2013-09-22: qty 3000

## 2013-09-22 NOTE — Progress Notes (Signed)
PARENTERAL NUTRITION CONSULT NOTE - FOLLOW UP  Pharmacy Consult for TNA Indication: Enterocutaneous fistula  No Known Allergies  Patient Measurements: Height: 6' (182.9 cm) Weight: 332 lb 3.7 oz (150.7 kg) IBW/kg (Calculated) : 77.6 Adjusted Body Weight: 102.6kg  Vital Signs: Temp: 98.4 F (36.9 C) (12/17 0440) Temp src: Oral (12/17 0440) BP: 136/70 mmHg (12/17 0440) Pulse Rate: 59 (12/17 0440) Intake/Output from previous day: 12/16 0701 - 12/17 0700 In: 2680.5 [I.V.:480; TPN:2200.5] Out: 1025 [Urine:1025]  Labs:  Recent Labs  09/20/13 0400 09/22/13 0410  WBC 8.8 7.6  HGB 10.5* 10.3*  HCT 30.3* 30.7*  PLT 334 314    Recent Labs  09/20/13 0400 09/22/13 0410  NA 132* 134*  K 3.8 3.9  CL 94* 98  CO2 29 29  GLUCOSE 103* 116*  BUN 24* 24*  CREATININE 1.02 1.03  CALCIUM 8.9 9.0  MG 1.6  --   PHOS 4.9*  --   PROT 7.3  --   ALBUMIN 2.2*  --   AST 43*  --   ALT 51  --   ALKPHOS 89  --   BILITOT 1.0  --   PREALBUMIN 13.4*  --   TRIG 101  --     Recent Labs  09/21/13 2014 09/22/13 0004 09/22/13 0433  GLUCAP 143* 165* 122*   Insulin Requirements in the past 24 hours:   Lantus 30 units qhs.  Note Lantus requirements fluctuate recently with a few hypoglycemic events.  Required 5 units SSI/24h  - CBG q4h with sensitive scale coverage TNA contains regular insulin 35 units per 3L bag (11.7 units/L)  Nutritional Goals:   Re-estimated per RD 12/2:  2370-2650 kcal/day, 160-180 grams/day protein, 3.3-3.5 L/day fluid  Clinimix E5/15 at goal rate 125 ml/hr (3L/day) with 20% lipids at 10 ml/hr to provide 150g protein and 2675 kcal daily.  Due to how much fluid can be placed in TNA bag, maximum volume is 3L.   Current Nutrition:   NPO except for ice chips, sips with meds  Clinimix E5/15 at 176ml/hr and 20% lipids at 10 ml/hr over 24 hours  IVF: NS at [place]: 62 yo male with large abdominal abscess with infected old Gore Tex mesh s/p I&D  abscess, removal of mesh, debridement of abdominal wall and peritoneum. 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 TNA started 11/12 at 10pm.  Patient currently unable to be discharged to a facility nearby that can accommodate both TNA and drainage system.   12/17: Pt continues 24 hour continuous TPN.  Per surgery, fistula with moderate to high output (volume not recorded).  Per surgery note 12/15, wound decreasing in size. Continuing sandostatin and TNA as patient on bowel rest.   Labs:   Glucose: Hx DM on insulin PTA. CBGs above goal (150mg /dl) x 2, hypoglycemia resolved.   Electrolytes: Na 134  Renal:  SCr stable/wnl.  LFTs: AST increased but improving, ALT WNL.  Albumin 2.2  TGs: 101 (12/15)  Prealbumin: 10.3 (11/13), 11.1 (11/17), 11.2(11/24), 13.6 (12/1), 10.3 (12/8), 13.4 (12/15)  TPN Access: Triple lumen PICC placed 11/12 TPN day#: 35  Plan:   Continue Clinimix E 5/15 at 125 ml/hr with lipids 20% at 10 ml/hr over 24 hours.     Continue insulin 35 units per 3L bag ( 11.7 units/L)  TNA to contain standard multivitamins and trace elements.  Continue CBG checks q4h with sensitive SSI coverage.     Continue Lantus 30 units QHS.  KCl 24mEq PO daily per  MD  Continue NS at [place] on Mondays & Thursdays.   F/u discharge plans and TNA arrangement.  Doreene Eland, PharmD, BCPS.   Pager: [phone] 09/22/2013 7:38 AM

## 2013-09-22 NOTE — Progress Notes (Signed)
RT went to place patient on CPAP. Patient refuses at this time. RT let patient know if he changed his mind to let the RN know then she would call the RT to come back and place patient on the CPAP machine.

## 2013-09-22 NOTE — Progress Notes (Signed)
Patient ID: Kurt Cross, male   DOB: 30-Mar-1951, 62 y.o.   MRN: [id number] 39 Days Post-Op  Subjective: Pt in better spirits today.  Objective: Vital signs in last 24 hours: Temp:  [98.4 F (36.9 C)] 98.4 F (36.9 C) (12/17 0440) Pulse Rate:  [59-60] 59 (12/17 0440) Resp:  [16-20] 16 (12/17 0440) BP: (134-139)/(63-70) 136/70 mmHg (12/17 0440) SpO2:  [95 %-99 %] 99 % (12/17 0440) Weight:  [332 lb 3.7 oz (150.7 kg)] 332 lb 3.7 oz (150.7 kg) (12/17 0440) Last BM Date: 09/21/13  Intake/Output from previous day: 12/16 0701 - 12/17 0700 In: 2680.5 [I.V.:480; TPN:2200.5] Out: 1325 [Urine:1025; Drains:300] Intake/Output this shift:    PE: Abd: stable.  Bag has more thick output in it today causing his red rubber catheter to clog up more frequently which is allowing the enteric contents to build up in his bag.  No evidence of leaking currently  Lab Results:   Recent Labs  09/20/13 0400 09/22/13 0410  WBC 8.8 7.6  HGB 10.5* 10.3*  HCT 30.3* 30.7*  PLT 334 314   BMET  Recent Labs  09/20/13 0400 09/22/13 0410  NA 132* 134*  K 3.8 3.9  CL 94* 98  CO2 29 29  GLUCOSE 103* 116*  BUN 24* 24*  CREATININE 1.02 1.03  CALCIUM 8.9 9.0   PT/INR No results found for this basename: LABPROT, INR,  in the last 72 hours CMP     Component Value Date/Time   NA 134* 09/22/2013 0410   K 3.9 09/22/2013 0410   CL 98 09/22/2013 0410   CO2 29 09/22/2013 0410   GLUCOSE 116* 09/22/2013 0410   BUN 24* 09/22/2013 0410   CREATININE 1.03 09/22/2013 0410   CALCIUM 9.0 09/22/2013 0410   PROT 7.3 09/20/2013 0400   ALBUMIN 2.2* 09/20/2013 0400   AST 43* 09/20/2013 0400   ALT 51 09/20/2013 0400   ALKPHOS 89 09/20/2013 0400   BILITOT 1.0 09/20/2013 0400   GFRNONAA 77* 09/22/2013 0410   GFRAA 89* 09/22/2013 0410   Lipase  No results found for this basename: lipase       Studies/Results: No results found.  Anti-infectives: Anti-infectives   Start     Dose/Rate Route Frequency  Ordered Stop   08/31/13 0800  vancomycin (VANCOCIN) 1,750 mg in sodium chloride 0.9 % 500 mL IVPB  Status:  Discontinued     1,750 mg 250 mL/hr over 120 Minutes Intravenous Every 24 hours 08/31/13 0753 08/31/13 1417   08/16/13 0800  vancomycin (VANCOCIN) 2,000 mg in sodium chloride 0.9 % 500 mL IVPB  Status:  Discontinued     2,000 mg 250 mL/hr over 120 Minutes Intravenous Every 24 hours 08/15/13 1411 08/31/13 0753   08/15/13 1500  vancomycin (VANCOCIN) 2,000 mg in sodium chloride 0.9 % 500 mL IVPB     2,000 mg 250 mL/hr over 120 Minutes Intravenous  Once 08/15/13 1411 08/15/13 1713   08/14/13 1600  piperacillin-tazobactam (ZOSYN) IVPB 3.375 g  Status:  Discontinued     3.375 g 12.5 mL/hr over 240 Minutes Intravenous Every 8 hours 08/14/13 1543 08/31/13 1417       Assessment/Plan  POD 39  Infected prosthetic mesh of abdominal wall with GIANT abscess s/p removal 08/14/2013 Dr. 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  Diabetes mellitus, insulin dependent (IDDM), uncontrolled  HYPERLIPIDEMIA  Obesity, Class III, BMI 40-49.9 (morbid obesity)  OBSTRUCTIVE SLEEP  APNEA  Acute renal insufficiency from fistula/wound loss-improved  Deconditioning  Plan: 1. Patient's output is being to thicken up enough that it is clogging his red rubber catheter quite frequently.  This will need to be monitored frequently by nursing staff to flush his lines and keep it unclogged so it can drain and cont to allow for wound healing.   LOS: 42 days    OSBORNE,KELLY E 09/22/2013, 9:32 AM Pager: [phone]  Agree with above.  Stable.  Alphonsa Overall, MD, [facility] Surgery Pager: [phone] Office phone:  [phone]

## 2013-09-22 NOTE — Progress Notes (Signed)
Patient ID: Kurt Cross, male   DOB: [birth year], 62 y.o.   MRN: [id number] [facility] PROGRESS NOTE  Kurt Cross [id number] DOB: [birth year] DOA: 08/11/2013 PCP: [name], MD  Brief Narrative:  Pt is 62 y.o. Male with known history of chronic systolic heart failure status post defibrillator placement last EF measured was in 2013 was 35-40%, on home oxygen, OSA noncompliant with CPAP, diabetes mellitus, hypertension and hyperlipidemia presented to the ER because right leg pain. He had been hospitalized a week prior with sepsis that was presumed to be due to UTI and discharged home. In the ER, he was found to have a leukocytosis and AKI. Abdominal CT demonstrated infected prosthetic Gore-Tex mesh. His mesh was removed and 3L of pus was drained. 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 He also has chronic a-flutter which is rate controlled. He is on anticoagulation.   Assessment/Plan  Infected prosthetic gore-tex mesh  - with large abscess s/p I&D and removal of gore-tex on 11/8. 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  - Continue sandostatin and TNA for now  - Continuous wall suction  - no significant clinical change  - pt will remain in the hospital for now as unable to go to LTAC in [place] and no available beds in [facility] Sepsis  - due to coag neg staph s/p 2 weeks of vancomycin, resolved.  - off ABX  HTN  - blood pressure stable  - cont coreg and lasix, hydralazine, imdur  #4 atrial fibrillation/atrial flutter  - secondary to problem #1. Now ressolved, CE x 3 neg, 2 D ECHO EF 55%  - currently rate controlled on Coreg  - CHADSVASc score is 4 (not ASVD plaque on abd aorta), resumed eliquis  #5 chronic kidney disease stage III  - stable, Cr WNL  #6 leukocytosis/fever  - resolved.  #7 chronic combined systolic and diastolic CHF  - weight stable around  150 kg or 330lbs  - clinically compensated  OSA,  - stable. Continue CPAP.  #8 Uncontrolled Pain  - continue prn oxycodone as needed  #9 hyperlipidemia  - Continue statin.  #10 4.5 cm ascending aortic aneurysm per CT scan of 08/01/2013  - Stable. Asymptomatic.  #11 type 2 diabetes  - continue Lantus and SSI  #12. Hypomagnesemia  - supplemented by TPN   Diet: NPO with ice chips  Access: TL PICC  IVF: TPN  Proph: apixaban   Code Status: full  Family Communication: patient alone  Disposition Plan: Patient is not a good candidate for SNF due to complexity of care, including need for continuous TPN and continuous wall suction. These interventions can easily be managed by an LTACH. The patient is otherwise stable but no bed available at [facility] and pt refusing to go to [facility] in [place].    Antibiotics  Vancomycin 08/15/13>>> 08/31/13  Zosyn 08/14/13>>>08/31/13  Procedures/Studies:  Ct Abdomen Pelvis Wo Contrast 08/14/2013 14.5 x 18.1 x 26.1 cm fluid collection/abscess within the anterior abdominal wall, as described above, incompletely visualized. The collection appears just anterior to prior ventral hernia mesh. Given the size and proximity to the skin surface, incision and drainage is suggested.  Dg Chest 2 View 08/01/2013 No active cardiopulmonary disease.  Dg Hip Complete Right 08/11/2013 No acute osseous injury of the right hip.  Ct Angio Chest Pe W/cm &/or Wo Cm 08/01/2013 4.5 cm AAA without complicating features. No evidence of acute pulmonary embolism or aortic  dissection.  Dg Chest Port 1 View 08/18/2013 Right upper extremity PICC terminates in the mid superior vena cava. Stable congestive heart failure pattern.  Dg Chest Port 1 View 08/17/2013 Stable chest with changes of congestive heart failure with pulmonary interstitial edema. Cardiac pacer in stable position.  Dg Chest Portable 1 View 08/15/2013 Stable cardiomegaly with interval worsening of pulmonary vascular congestion/  edema relative to 08/13/2013.  Dg Chest Port 1 View 08/13/2013 No acute findings. Stable cardiomegaly and pulmonary venous prominence without overt edema.  2-D echo 08/12/2013  Incision and drainage of abscess, Gore-Tex mesh removal, debridement of abdominal wall and peritoneum Consultants:  Cardiology: Dr. Terrence Dupont 08/13/2013 - signed off  General surgery:  ID: Dr Megan Salon 08/15/13 -- signed off   HPI/Subjective: No events overnight.   Objective: Filed Vitals:   09/21/13 0415 09/21/13 1408 09/21/13 2016 09/22/13 0440  BP: 137/69 134/63 139/63 136/70  Pulse: 62 60 60 59  Temp: 98.4 F (36.9 C) 98.4 F (36.9 C) 98.4 F (36.9 C) 98.4 F (36.9 C)  TempSrc: Oral Oral Oral Oral  Resp: 18 18 20 16   Height:      Weight: 148 kg (326 lb 4.5 oz)   150.7 kg (332 lb 3.7 oz)  SpO2: 97% 95% 97% 99%    Intake/Output Summary (Last 24 hours) at 09/22/13 1303 Last data filed at 09/22/13 0700  Gross per 24 hour  Intake 2680.5 ml  Output    825 ml  Net 1855.5 ml    Exam:   General:  Pt is alert, follows commands appropriately, not in acute distress  Cardiovascular: Regular rate and rhythm, S1/S2, no murmurs, no rubs, no gallops  Respiratory: Clear to auscultation bilaterally, no wheezing, no crackles, no rhonchi  Abdomen: Soft, non tender, non distended, open abdominal wound with drainage   Extremities: No edema, pulses DP and PT palpable bilaterally  Neuro: Grossly nonfocal  Data Reviewed: Basic Metabolic Panel:  Recent Labs Lab 09/16/13 0334 09/17/13 0405 09/18/13 0339 09/20/13 0400 09/22/13 0410  NA 134* 133* 133* 132* 134*  K 4.0 4.1 4.2 3.8 3.9  CL 98 97 97 94* 98  CO2 27 26 27 29 29   GLUCOSE 120* 131* 110* 103* 116*  BUN 23 21 23  24* 24*  CREATININE 1.00 0.98 0.98 1.02 1.03  CALCIUM 9.2 8.6 9.1 8.9 9.0  MG 1.7  --   --  1.6  --   PHOS 4.4  --   --  4.9*  --    Liver Function Tests:  Recent Labs Lab 09/16/13 0334 09/17/13 0405 09/18/13 0339 09/20/13 0400   AST 87* 70* 59* 43*  ALT 90* 78* 71* 51  ALKPHOS 104 88 98 89  BILITOT 1.1 1.0 1.0 1.0  PROT 7.5 6.7 7.4 7.3  ALBUMIN 2.2* 2.0* 2.1* 2.2*   CBC:  Recent Labs Lab 09/16/13 1120 09/17/13 0405 09/20/13 0400 09/22/13 0410  WBC 7.8 8.0 8.8 7.6  NEUTROABS  --   --  4.8  --   HGB 10.1* 10.1* 10.5* 10.3*  HCT 29.9* 29.9* 30.3* 30.7*  MCV 92.0 92.6 91.0 91.9  PLT 339 331 334 314   CBG:  Recent Labs Lab 09/21/13 2014 09/22/13 0004 09/22/13 0433 09/22/13 0750 09/22/13 1151  GLUCAP 143* 165* 122* 109* 132*    Scheduled Meds: . acetaminophen  1,000 mg Oral TID  . antiseptic oral rinse  15 mL Mouth Rinse q12n4p  . apixaban  5 mg Oral BID  . atorvastatin  40 mg Oral  QODAY  . carvedilol  3.125 mg Oral BID WC  . ferrous sulfate  325 mg Oral BID  . furosemide  40 mg Oral Daily  . hydrALAZINE  50 mg Oral Q8H  . insulin aspart  0-9 Units Subcutaneous Q4H  . insulin glargine  30 Units Subcutaneous QHS  . isosorbide mononitrate  15 mg Oral Daily  . lip balm  1 application Topical BID  . neomycin-bacitracin-polymyxin   Topical BID  . octreotide  150 mcg Subcutaneous TID  . OxyCODONE  15 mg Oral Q12H  . potassium chloride  40 mEq Oral Daily  . saccharomyces boulardii  250 mg Oral BID  . sodium chloride  3 mL Intravenous Q12H  . zolpidem  10 mg Oral QHS   Continuous Infusions: . sodium chloride 20 mL/hr at 09/22/13 0650  . [name] KitchenTPN (CLINIMIX-E) Adult 125 mL/hr at 09/21/13 1716   And  . fat emulsion 250 mL (09/21/13 1716)  . [name] KitchenTPN (CLINIMIX-E) Adult     And  . fat emulsion     Kelvin Cellar, MD  [place] Pager [phone]  If 7PM-7AM, please contact night-coverage [email or web address] Password [email or web address] 09/22/2013, 1:03 PM   LOS: 42 days

## 2013-09-22 NOTE — Consult Note (Signed)
WOC wound follow up Wound type: Open abdomen with enteric fistula Dressing procedure/placement/frequency: Patient's wound pouch examined and catheter noted to be clogging periodically due to thickening of effluent.I have milked  And repositioned it and it is now functioning.  The system  is not leaking and while I cannot guarantee that it will not leak tonight, I feel that it is unlikely.  I have arranged to meet with Saverio Danker, CCS PA tomorrow morning so that she can visualize the wound during the change.  Plan change tomorrow am. Thanks, Maudie Flakes, MSN, RN, [place], [place], [place] [phone])

## 2013-09-23 DIAGNOSIS — K432 Incisional hernia without obstruction or gangrene: Secondary | ICD-10-CM

## 2013-09-23 LAB — GLUCOSE, CAPILLARY
Glucose-Capillary: 136 mg/dL — ABNORMAL HIGH (ref 70–99)
Glucose-Capillary: 148 mg/dL — ABNORMAL HIGH (ref 70–99)
Glucose-Capillary: 163 mg/dL — ABNORMAL HIGH (ref 70–99)
Glucose-Capillary: 163 mg/dL — ABNORMAL HIGH (ref 70–99)

## 2013-09-23 LAB — CBC
HCT: 32.3 % — ABNORMAL LOW (ref 39.0–52.0)
Hemoglobin: 10.7 g/dL — ABNORMAL LOW (ref 13.0–17.0)
MCV: 92 fL (ref 78.0–100.0)
Platelets: 321 10*3/uL (ref 150–400)

## 2013-09-23 LAB — COMPREHENSIVE METABOLIC PANEL
Albumin: 2.2 g/dL — ABNORMAL LOW (ref 3.5–5.2)
BUN: 24 mg/dL — ABNORMAL HIGH (ref 6–23)
CO2: 28 mEq/L (ref 19–32)
Calcium: 8.8 mg/dL (ref 8.4–10.5)
Chloride: 95 mEq/L — ABNORMAL LOW (ref 96–112)
Creatinine, Ser: 1 mg/dL (ref 0.50–1.35)
GFR calc Af Amer: >90 mL/min (ref >90)
GFR calc non Af Amer: 80 mL/min — ABNORMAL LOW (ref >90)
Total Bilirubin: 1.1 mg/dL (ref 0.3–1.2)

## 2013-09-23 LAB — MAGNESIUM: Magnesium: 1.7 mg/dL (ref 1.5–2.5)

## 2013-09-23 LAB — PHOSPHORUS: Phosphorus: 4.7 mg/dL — ABNORMAL HIGH (ref 2.3–4.6)

## 2013-09-23 MED ORDER — MORPHINE SULFATE 2 MG/ML IJ SOLN
2.0000 mg | INTRAMUSCULAR | Status: DC | PRN
  Administered 2013-09-23 – 2013-10-14 (×83): 2 mg via INTRAVENOUS
  Filled 2013-09-23 (×86): qty 1

## 2013-09-23 MED ORDER — FAT EMULSION 20 % IV EMUL
250.0000 mL | INTRAVENOUS | Status: AC
  Administered 2013-09-23: 250 mL via INTRAVENOUS
  Filled 2013-09-23: qty 250

## 2013-09-23 MED ORDER — M.V.I. ADULT IV INJ
INTRAVENOUS | Status: AC
  Administered 2013-09-23: 17:00:00 via INTRAVENOUS
  Filled 2013-09-23: qty 3000

## 2013-09-23 MED ORDER — CLOTRIMAZOLE 1 % EX CREA
TOPICAL_CREAM | Freq: Two times a day (BID) | CUTANEOUS | Status: DC
  Administered 2013-09-23: 1 via TOPICAL
  Administered 2013-09-24 – 2013-10-11 (×36): via TOPICAL
  Administered 2013-10-12 (×2): 1 via TOPICAL
  Administered 2013-10-13 – 2013-10-15 (×5): via TOPICAL
  Filled 2013-09-23 (×4): qty 15

## 2013-09-23 NOTE — Consult Note (Addendum)
WOC wound follow up Wound type: Open abdomen with stomatized enteric fistula at 5 o'clock Measurement: 16cm x 11cm x 1.5cm with undermining from 10 o'clock to 7 o'clock.  Undermining measures 2.5cm at 12 o'clock, 6cm at 3 o'clock, 4cm at 6 o'clock. Wound bed: 90% clean, red granulation tissue, 10% white non-viable tissue dissolving via autolysis. Drainage (amount, consistency, odor) thickening liquid effluent; serosanguinous drainage after surgical exploration of periphery today by Dr. Lucia Gaskins. Periwound: Intact.  Closed wound edges from 7-8 o'clock. Dressing procedure/placement/frequency: large Eakin pouch applied with drain spout directed toward right side after first dressing site of previous colostomy with saline dampened gauze and topping with dry gauze.  Wound periphery is bordered with skin barrier ring strips prior to pouch application. A single piece of silicone non-adherent dressing is placed on the distal half of the wound bed and does not cover the fistula.  A red robinson catheter (#20) is introduced into the pouching system and placed at the distal end of the pouch.  System is attached to continuous wall suction at 75-80 mmHg.  Patient tolerated procedure well, albeit he is requesting pain medication due to discomfort felt during exploration. CCS acquired photographs of wound today for patient record for communication purposes.  When MD left the room, patient called wife and requested she come to hospital to photograph wounds as well.  Wife came to room, took several photographs and remained with patient until I had completed pouching the wound. [place] nursing team will continue to follow, and will remain available to this patient, the nursing and medical and surgical teams.  Please re-consult if needed between visits. Next pouch change due on Monday, 12/22. Thanks, Maudie Flakes, MSN, RN, [place], [place], [place] [phone])

## 2013-09-23 NOTE — Progress Notes (Signed)
Pt was placed on CPAP with the previous settings of 8.5 cmH2O with 3 L of O2 bled in. Pt is tolerating well at this time and no signs of distress. Water was added to the humidifer chamber and advised pt that the RT would continue to monitor.

## 2013-09-23 NOTE — Progress Notes (Signed)
PARENTERAL NUTRITION CONSULT NOTE - FOLLOW UP  Pharmacy Consult for TNA Indication: Enterocutaneous fistula  No Known Allergies  Patient Measurements: Height: 6' (182.9 cm) Weight: 322 lb 1.5 oz (146.1 kg) IBW/kg (Calculated) : 77.6 Adjusted Body Weight: 102.6kg  Vital Signs: Temp: 97.9 F (36.6 C) (12/18 0439) Temp src: Oral (12/18 0439) BP: 147/79 mmHg (12/18 0439) Pulse Rate: 66 (12/18 0439) Intake/Output from previous day: 12/17 0701 - 12/18 0700 In: 3654.7 [I.V.:414.7; TPN:3240] Out: 950 [Urine:950]  Labs:  Recent Labs  09/22/13 0410  WBC 7.6  HGB 10.3*  HCT 30.7*  PLT 314    Recent Labs  09/22/13 0410 09/23/13 0525  NA 134* 132*  K 3.9 3.6  CL 98 95*  CO2 29 28  GLUCOSE 116* 153*  BUN 24* 24*  CREATININE 1.03 1.00  CALCIUM 9.0 8.8  MG  --  1.7  PHOS  --  4.7*  PROT  --  7.3  ALBUMIN  --  2.2*  AST  --  45*  ALT  --  48  ALKPHOS  --  81  BILITOT  --  1.1    Recent Labs  09/22/13 2025 09/23/13 0004 09/23/13 0418  GLUCAP 160* 148* 136*   Insulin Requirements in the past 24 hours:   Lantus 30 units qhs.  Note Lantus requirements fluctuate recently with a few hypoglycemic events.  Required 7 units SSI/24h  - CBG q4h with sensitive scale coverage TNA contains regular insulin 35 units per 3L bag (11.7 units/L)  Nutritional Goals:   Re-estimated per RD 12/2:  2370-2650 kcal/day, 160-180 grams/day protein, 3.3-3.5 L/day fluid  Clinimix E5/15 at goal rate 125 ml/hr (3L/day) with 20% lipids at 10 ml/hr to provide 150g protein and 2675 kcal daily.  Due to how much fluid can be placed in TNA bag, maximum volume is 3L.   Current Nutrition:   NPO except for ice chips, sips with meds  Clinimix E5/15 at 156ml/hr and 20% lipids at 10 ml/hr over 24 hours  IVF: NS at [place]: 62 yo male with large abdominal abscess with infected old Gore Tex mesh s/p I&D abscess, removal of mesh, debridement of abdominal wall and peritoneum. Now with new  small bowel enterocutaneous fistula. TNA started 11/12 at 10pm.  Patient currently unable to be discharged to a facility nearby that can accommodate both TNA and drainage system.   12/18: Pt continues 24 hour continuous TPN.  Per surgery, fistula output thickening up thus will require close monitoring for possible clogging and need for flushing drain.  Per surgery note 12/15, wound decreasing in size. Continuing sandostatin and TNA as patient on bowel rest.   Labs:   Glucose: Hx DM on insulin PTA. CBGs above goal (150mg /dl) x 2, hypoglycemia resolved.   Electrolytes: Na 132, K = 3.6, Phos = 4.7 (stable), Mg = 1.7, Corr Ca = 10.24 (Ca x Phos = 48)  Renal:  SCr stable/wnl.  LFTs: AST slightly improved, ALT WNL.  Albumin 2.2  TGs: 101 (12/15)  Prealbumin: 10.3 (11/13), 11.1 (11/17), 11.2(11/24), 13.6 (12/1), 10.3 (12/8), 13.4 (12/15)  TPN Access: Triple lumen PICC placed 11/12 TPN day#: 36  Plan:   Continue Clinimix E 5/15 at 125 ml/hr with lipids 20% at 10 ml/hr over 24 hours.     Continue insulin 35 units per 3L bag ( 11.7 units/L)  TNA to contain standard multivitamins and trace elements.  Continue CBG checks q4h with sensitive SSI coverage.     Continue Lantus 30  units QHS.  KCl 42mEq PO daily per MD  Continue NS at [place] on Mondays & Thursdays.   F/u discharge plans and TNA arrangement.  Doreene Eland, PharmD, BCPS.   Pager: [phone] 09/23/2013 7:09 AM

## 2013-09-23 NOTE — Progress Notes (Signed)
Placed pt on Auto CPAP min 5 and max 20cmH20. Sterile water was added to the humidity chamber and 3lpm of oxygen was bled into the machine. Pt is comfortable and tolerating CPAP well at this time with SpO2 97%. RT will continue to monitor as needed.

## 2013-09-23 NOTE — Progress Notes (Signed)
Patient ID: Kurt Cross, male   DOB: 08-25-1951, 62 y.o.   MRN: [id number] 40 Days Post-Op  Subjective: Patient is stable.    Objective: Vital signs in last 24 hours: Temp:  [97.9 F (36.6 C)-98.4 F (36.9 C)] 97.9 F (36.6 C) (12/18 0439) Pulse Rate:  [59-66] 61 (12/18 0756) Resp:  [16-18] 16 (12/18 0439) BP: (134-148)/(64-81) 148/78 mmHg (12/18 0756) SpO2:  [97 %-100 %] 99 % (12/18 0439) Weight:  [322 lb 1.5 oz (146.1 kg)] 322 lb 1.5 oz (146.1 kg) (12/18 0439) Last BM Date: 09/21/13  Intake/Output from previous day: 12/17 0701 - 12/18 0700 In: 3654.7 [I.V.:414.7; TPN:3240] Out: 950 [Urine:950] Intake/Output this shift:    PE: Abd: soft, +BS, ND, wound is stable.  His wound is about 16x11x1.5cm with some undermining of his inferior edge of about 4 cm, left lateral edge of 6cm, and superior edge at 1.5cm of undermining.  The right lateral edge has closed down.  q-tip at 5 oclock position is in fistula.          Lab Results:   Recent Labs  09/22/13 0410  WBC 7.6  HGB 10.3*  HCT 30.7*  PLT 314   BMET  Recent Labs  09/22/13 0410 09/23/13 0525  NA 134* 132*  K 3.9 3.6  CL 98 95*  CO2 29 28  GLUCOSE 116* 153*  BUN 24* 24*  CREATININE 1.03 1.00  CALCIUM 9.0 8.8   PT/INR No results found for this basename: LABPROT, INR,  in the last 72 hours CMP     Component Value Date/Time   NA 132* 09/23/2013 0525   K 3.6 09/23/2013 0525   CL 95* 09/23/2013 0525   CO2 28 09/23/2013 0525   GLUCOSE 153* 09/23/2013 0525   BUN 24* 09/23/2013 0525   CREATININE 1.00 09/23/2013 0525   CALCIUM 8.8 09/23/2013 0525   PROT 7.3 09/23/2013 0525   ALBUMIN 2.2* 09/23/2013 0525   AST 45* 09/23/2013 0525   ALT 48 09/23/2013 0525   ALKPHOS 81 09/23/2013 0525   BILITOT 1.1 09/23/2013 0525   GFRNONAA 80* 09/23/2013 0525   GFRAA >90 09/23/2013 0525   Lipase  No results found for this basename: lipase       Studies/Results: No results  found.  Anti-infectives: Anti-infectives   Start     Dose/Rate Route Frequency Ordered Stop   08/31/13 0800  vancomycin (VANCOCIN) 1,750 mg in sodium chloride 0.9 % 500 mL IVPB  Status:  Discontinued     1,750 mg 250 mL/hr over 120 Minutes Intravenous Every 24 hours 08/31/13 0753 08/31/13 1417   08/16/13 0800  vancomycin (VANCOCIN) 2,000 mg in sodium chloride 0.9 % 500 mL IVPB  Status:  Discontinued     2,000 mg 250 mL/hr over 120 Minutes Intravenous Every 24 hours 08/15/13 1411 08/31/13 0753   08/15/13 1500  vancomycin (VANCOCIN) 2,000 mg in sodium chloride 0.9 % 500 mL IVPB     2,000 mg 250 mL/hr over 120 Minutes Intravenous  Once 08/15/13 1411 08/15/13 1713   08/14/13 1600  piperacillin-tazobactam (ZOSYN) IVPB 3.375 g  Status:  Discontinued     3.375 g 12.5 mL/hr over 240 Minutes Intravenous Every 8 hours 08/14/13 1543 08/31/13 1417       Assessment/Plan  POD 40  Infected prosthetic mesh of abdominal wall with GIANT abscess s/p removal 08/14/2013 Dr. 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  Diabetes  mellitus, insulin dependent (IDDM), uncontrolled  HYPERLIPIDEMIA  Obesity, Class III, BMI 40-49.9 (morbid obesity)  OBSTRUCTIVE SLEEP APNEA  Acute renal insufficiency from fistula/wound loss-improved  Deconditioning  Plan: 1. Patient is stable, cont TNA and bowel rest.  He will cont to need continuous wall suction for now. 2. Patient appears to have tinea versicolor on his left thigh.  Defer treatment to medicine.  LOS: 43 days    OSBORNE,KELLY E 09/23/2013, 10:25 AM Pager: [phone]  Wound is getting better, though slowly. Single ostomy at 5 o'clock position.  The rest of the wound has good granulation tissue.  Alphonsa Overall, MD, [facility] Surgery Pager: [phone] Office phone:  [phone]

## 2013-09-23 NOTE — Progress Notes (Signed)
Patient ID: Kurt Cross, male   DOB: 1951-01-30, 62 y.o.   MRN: [id number] [facility] PROGRESS NOTE  Kurt Cross [id number] DOB: 13-Dec-1950 DOA: 08/11/2013 PCP: [name], MD  Brief Narrative:  Pt is 62 y.o. Male with known history of chronic systolic heart failure status post defibrillator placement last EF measured was in 2013 was 35-40%, on home oxygen, OSA noncompliant with CPAP, diabetes mellitus, hypertension and hyperlipidemia presented to the ER because right leg pain. He had been hospitalized a week prior with sepsis that was presumed to be due to UTI and discharged home. In the ER, he was found to have a leukocytosis and AKI. Abdominal CT demonstrated infected prosthetic Gore-Tex mesh. His mesh was removed and 3L of pus was drained. 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 He also has chronic a-flutter which is rate controlled. He is on anticoagulation.   Assessment/Plan  Infected prosthetic gore-tex mesh  - with large abscess s/p I&D and removal of gore-tex on 11/8. 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  - Continue sandostatin and TNA for now  - Continuous wall suction  - no significant clinical change  - pt will remain in the hospital for now as unable to go to LTAC in [place] and no available beds in [facility]  - due to coag neg staph s/p 2 weeks of vancomycin, resolved.  - off ABX  HTN  - blood pressure stable  - cont coreg and lasix, hydralazine, imdur  #4 atrial fibrillation/atrial flutter  - secondary to problem #1. Now ressolved, CE x 3 neg, 2 D ECHO EF 55%  - currently rate controlled on Coreg  - CHADSVASc score is 4 (not ASVD plaque on abd aorta), resumed eliquis  #5 chronic kidney disease stage III  - stable, Cr WNL  #6 leukocytosis/fever  - resolved.  #7 chronic combined systolic and diastolic CHF  - weight stable around  150 kg or 330lbs  - clinically compensated  OSA,  - stable. Continue CPAP.  #8 Uncontrolled Pain  - continue prn oxycodone as needed  #9 hyperlipidemia  - Continue statin.  #10 4.5 cm ascending aortic aneurysm per CT scan of 08/01/2013  - Stable. Asymptomatic.  #11 type 2 diabetes  - continue Lantus and SSI  #12. Hypomagnesemia  - supplemented by TPN  Superficial Fungal Infection -Start Clotrimazole cream  Diet: NPO with ice chips  Access: TL PICC  IVF: TPN  Proph: apixaban   Code Status: full  Family Communication: patient alone  Disposition Plan: Patient is not a good candidate for SNF due to complexity of care, including need for continuous TPN and continuous wall suction. These interventions can easily be managed by an LTACH. The patient is otherwise stable but no bed available at [facility] and pt refusing to go to [facility] in [place].    Antibiotics  Vancomycin 08/15/13>>> 08/31/13  Zosyn 08/14/13>>>08/31/13  Procedures/Studies:  Ct Abdomen Pelvis Wo Contrast 08/14/2013 14.5 x 18.1 x 26.1 cm fluid collection/abscess within the anterior abdominal wall, as described above, incompletely visualized. The collection appears just anterior to prior ventral hernia mesh. Given the size and proximity to the skin surface, incision and drainage is suggested.  Dg Chest 2 View 08/01/2013 No active cardiopulmonary disease.  Dg Hip Complete Right 08/11/2013 No acute osseous injury of the right hip.  Ct Angio Chest Pe W/cm &/or Wo Cm 08/01/2013 4.5 cm AAA without complicating features. No evidence  of acute pulmonary embolism or aortic dissection.  Dg Chest Port 1 View 08/18/2013 Right upper extremity PICC terminates in the mid superior vena cava. Stable congestive heart failure pattern.  Dg Chest Port 1 View 08/17/2013 Stable chest with changes of congestive heart failure with pulmonary interstitial edema. Cardiac pacer in stable position.  Dg Chest Portable 1 View 08/15/2013 Stable cardiomegaly  with interval worsening of pulmonary vascular congestion/ edema relative to 08/13/2013.  Dg Chest Port 1 View 08/13/2013 No acute findings. Stable cardiomegaly and pulmonary venous prominence without overt edema.  2-D echo 08/12/2013  Incision and drainage of abscess, Gore-Tex mesh removal, debridement of abdominal wall and peritoneum Consultants:  Cardiology: Dr. Terrence Dupont 08/13/2013 - signed off  General surgery:  ID: Dr Megan Salon 08/15/13 -- signed off   HPI/Subjective: No events overnight.   Objective: Filed Vitals:   09/23/13 0033 09/23/13 0439 09/23/13 0756 09/23/13 1339  BP:  147/79 148/78 129/66  Pulse: 60 66 61 61  Temp:  97.9 F (36.6 C)  98.3 F (36.8 C)  TempSrc:  Oral  Oral  Resp: 18 16  18   Height:      Weight:  146.1 kg (322 lb 1.5 oz)    SpO2: 97% 99%  99%    Intake/Output Summary (Last 24 hours) at 09/23/13 1615 Last data filed at 09/23/13 1300  Gross per 24 hour  Intake 3654.67 ml  Output   1900 ml  Net 1754.67 ml    Exam:   General:  Pt is alert, follows commands appropriately, not in acute distress  Cardiovascular: Regular rate and rhythm, S1/S2, no murmurs, no rubs, no gallops  Respiratory: Clear to auscultation bilaterally, no wheezing, no crackles, no rhonchi  Abdomen: Soft, non tender, non distended, open abdominal wound with drainage   Extremities: No edema, pulses DP and PT palpable bilaterally  Neuro: Grossly nonfocal  Data Reviewed: Basic Metabolic Panel:  Recent Labs Lab 09/17/13 0405 09/18/13 0339 09/20/13 0400 09/22/13 0410 09/23/13 0525  NA 133* 133* 132* 134* 132*  K 4.1 4.2 3.8 3.9 3.6  CL 97 97 94* 98 95*  CO2 26 27 29 29 28   GLUCOSE 131* 110* 103* 116* 153*  BUN 21 23 24* 24* 24*  CREATININE 0.98 0.98 1.02 1.03 1.00  CALCIUM 8.6 9.1 8.9 9.0 8.8  MG  --   --  1.6  --  1.7  PHOS  --   --  4.9*  --  4.7*   Liver Function Tests:  Recent Labs Lab 09/17/13 0405 09/18/13 0339 09/20/13 0400 09/23/13 0525  AST 70*  59* 43* 45*  ALT 78* 71* 51 48  ALKPHOS 88 98 89 81  BILITOT 1.0 1.0 1.0 1.1  PROT 6.7 7.4 7.3 7.3  ALBUMIN 2.0* 2.1* 2.2* 2.2*   CBC:  Recent Labs Lab 09/17/13 0405 09/20/13 0400 09/22/13 0410 09/23/13 1135  WBC 8.0 8.8 7.6 8.5  NEUTROABS  --  4.8  --   --   HGB 10.1* 10.5* 10.3* 10.7*  HCT 29.9* 30.3* 30.7* 32.3*  MCV 92.6 91.0 91.9 92.0  PLT 331 334 314 321   CBG:  Recent Labs Lab 09/22/13 2025 09/23/13 0004 09/23/13 0418 09/23/13 0735 09/23/13 1148  GLUCAP 160* 148* 136* 147* 164*    Scheduled Meds: . acetaminophen  1,000 mg Oral TID  . antiseptic oral rinse  15 mL Mouth Rinse q12n4p  . apixaban  5 mg Oral BID  . atorvastatin  40 mg Oral QODAY  . carvedilol  3.125  mg Oral BID WC  . ferrous sulfate  325 mg Oral BID  . furosemide  40 mg Oral Daily  . hydrALAZINE  50 mg Oral Q8H  . insulin aspart  0-9 Units Subcutaneous Q4H  . insulin glargine  30 Units Subcutaneous QHS  . isosorbide mononitrate  15 mg Oral Daily  . lip balm  1 application Topical BID  . neomycin-bacitracin-polymyxin   Topical BID  . octreotide  150 mcg Subcutaneous TID  . OxyCODONE  15 mg Oral Q12H  . potassium chloride  40 mEq Oral Daily  . saccharomyces boulardii  250 mg Oral BID  . sodium chloride  3 mL Intravenous Q12H  . zolpidem  10 mg Oral QHS   Continuous Infusions: . sodium chloride 20 mL/hr at 09/22/13 0650  . [name] KitchenTPN (CLINIMIX-E) Adult 125 mL/hr at 09/22/13 1716   And  . fat emulsion 250 mL (09/22/13 1716)  . [name] KitchenTPN (CLINIMIX-E) Adult     And  . fat emulsion     Kelvin Cellar, MD  [place] Pager [phone]  If 7PM-7AM, please contact night-coverage [email or web address] Password [email or web address] 09/23/2013, 4:15 PM   LOS: 43 days

## 2013-09-24 LAB — GLUCOSE, CAPILLARY
Glucose-Capillary: 144 mg/dL — ABNORMAL HIGH (ref 70–99)
Glucose-Capillary: 152 mg/dL — ABNORMAL HIGH (ref 70–99)
Glucose-Capillary: 154 mg/dL — ABNORMAL HIGH (ref 70–99)
Glucose-Capillary: 159 mg/dL — ABNORMAL HIGH (ref 70–99)
Glucose-Capillary: 168 mg/dL — ABNORMAL HIGH (ref 70–99)
Glucose-Capillary: 175 mg/dL — ABNORMAL HIGH (ref 70–99)

## 2013-09-24 MED ORDER — LOPERAMIDE HCL 2 MG PO CAPS
4.0000 mg | ORAL_CAPSULE | Freq: Three times a day (TID) | ORAL | Status: DC
  Administered 2013-09-24 – 2013-09-25 (×5): 4 mg via ORAL
  Filled 2013-09-24 (×8): qty 2

## 2013-09-24 MED ORDER — FERROUS SULFATE 325 (65 FE) MG PO TABS
325.0000 mg | ORAL_TABLET | Freq: Three times a day (TID) | ORAL | Status: DC
  Administered 2013-09-24 – 2013-10-15 (×63): 325 mg via ORAL
  Filled 2013-09-24 (×64): qty 1

## 2013-09-24 MED ORDER — FAT EMULSION 20 % IV EMUL
250.0000 mL | INTRAVENOUS | Status: AC
  Administered 2013-09-24: 250 mL via INTRAVENOUS
  Filled 2013-09-24: qty 250

## 2013-09-24 MED ORDER — TRACE MINERALS CR-CU-F-FE-I-MN-MO-SE-ZN IV SOLN
INTRAVENOUS | Status: AC
  Administered 2013-09-24: 17:00:00 via INTRAVENOUS
  Filled 2013-09-24: qty 3000

## 2013-09-24 NOTE — Progress Notes (Signed)
Assumed care of pt at this time. Pt stable but c/o pain. Pain medication given per PRN orders. Pt's abdomen and pouch examined and both are intact. Pt was sitting in the chair but got back to bed at this time. Agree with this am's assessment. Will continue to monitor pt.  Othella Boyer [facility] 09/24/2013 1:20 PM

## 2013-09-24 NOTE — Progress Notes (Signed)
Physical Therapy Treatment Patient Details Name: Kurt Cross MRN: [id number] DOB: [birth year] Today's Date: 09/24/2013 Time: [id number] PT Time Calculation (min): 23 min  PT Assessment / Plan / Recommendation  History of Present Illness Kurt Cross is a 62 y.o. male  with known history of chronic systolic heart failure status post defibrillator placement last EF measured was in 2013 was 35-40%, on home oxygen, OSA noncompliant with CPAP, diabetes mellitus, hypertension and hyperlipidemia presented to the ER because right leg pain after sustaining a fall the night prior to admission.  08/14/13: s/p abdominal abscess removal   PT Comments   Assisted pt OOB to amb limited distance today due to increased c/o B LE weakness.  Pt demon mild B LE tremors such that chair was brought to him to immediately sit.   Follow Up Recommendations  SNF     Does the patient have the potential to tolerate intense rehabilitation     Barriers to Discharge        Equipment Recommendations       Recommendations for Other Services    Frequency Min 2X/week   Progress towards PT Goals Progress towards PT goals: Progressing toward goals  Plan      Precautions / Restrictions Precautions Precautions: Fall Precaution Comments: ostomy bag on R side and wall suction to ABD Restrictions Weight Bearing Restrictions: No    Pertinent Vitals/Pain No c/o pain    Mobility  Bed Mobility Bed Mobility: Supine to Sit Supine to Sit: 5: Supervision Details for Bed Mobility Assistance: increased time Transfers Transfers: Sit to Stand;Stand to Sit Sit to Stand: 4: Min guard;From bed Stand to Sit: 4: Min guard;To chair/3-in-1 Details for Transfer Assistance: Assisted OOB Ambulation/Gait Ambulation/Gait Assistance: 4: Min guard;5: Supervision Ambulation Distance (Feet): 50 Feet Assistive device: Rolling walker Ambulation/Gait Assistance Details: decreased amb distance today due to increased c/o B LE weakness with  noted tremors at end of amb distance.  Chair brought to pt.  Pt admitted he has not been walking for several days.   Gait Pattern: Step-through pattern;Wide base of support;Decreased stride length;Ataxic Gait velocity: decreased     PT Goals (current goals can now be found in the care plan section)    Visit Information  Last PT Received On: 09/24/13 Assistance Needed: +2 History of Present Illness: Kurt Cross is a 62 y.o. male  with known history of chronic systolic heart failure status post defibrillator placement last EF measured was in 2013 was 35-40%, on home oxygen, OSA noncompliant with CPAP, diabetes mellitus, hypertension and hyperlipidemia presented to the ER because right leg pain after sustaining a fall the night prior to admission.  08/14/13: s/p abdominal abscess removal    Subjective Data      Cognition       Balance     End of Session PT - End of Session Activity Tolerance: Patient limited by fatigue   Kurt Cross  PTA [facility]  Acute  Rehab Pager      [phone]

## 2013-09-24 NOTE — Progress Notes (Signed)
Patient ID: Kurt Cross, male   DOB: 01/18/1951, 62 y.o.   MRN: [id number] 41 Days Post-Op  Subjective: Pt feels ok.    Objective: Vital signs in last 24 hours: Temp:  [98.3 F (36.8 C)-99.1 F (37.3 C)] 98.3 F (36.8 C) (12/19 0544) Pulse Rate:  [61-65] 63 (12/19 0544) Resp:  [16-18] 16 (12/19 0544) BP: (129-152)/(60-94) 152/94 mmHg (12/19 0544) SpO2:  [98 %-100 %] 99 % (12/19 0544) Weight:  [323 lb 6.6 oz (146.7 kg)] 323 lb 6.6 oz (146.7 kg) (12/19 0544) Last BM Date: 09/21/13  Intake/Output from previous day: 12/18 0701 - 12/19 0700 In: 3720 [I.V.:480; TPN:3240] Out: 2950 [Urine:1950; Drains:1000] Intake/Output this shift:    PE: Abd: no change.  See previous note for description and picture.  Less particulate matter in pouch today  Lab Results:   Recent Labs  09/22/13 0410 09/23/13 1135  WBC 7.6 8.5  HGB 10.3* 10.7*  HCT 30.7* 32.3*  PLT 314 321   BMET  Recent Labs  09/22/13 0410 09/23/13 0525  NA 134* 132*  K 3.9 3.6  CL 98 95*  CO2 29 28  GLUCOSE 116* 153*  BUN 24* 24*  CREATININE 1.03 1.00  CALCIUM 9.0 8.8   PT/INR No results found for this basename: LABPROT, INR,  in the last 72 hours CMP     Component Value Date/Time   NA 132* 09/23/2013 0525   K 3.6 09/23/2013 0525   CL 95* 09/23/2013 0525   CO2 28 09/23/2013 0525   GLUCOSE 153* 09/23/2013 0525   BUN 24* 09/23/2013 0525   CREATININE 1.00 09/23/2013 0525   CALCIUM 8.8 09/23/2013 0525   PROT 7.3 09/23/2013 0525   ALBUMIN 2.2* 09/23/2013 0525   AST 45* 09/23/2013 0525   ALT 48 09/23/2013 0525   ALKPHOS 81 09/23/2013 0525   BILITOT 1.1 09/23/2013 0525   GFRNONAA 80* 09/23/2013 0525   GFRAA >90 09/23/2013 0525   Lipase  No results found for this basename: lipase       Studies/Results: No results found.  Anti-infectives: Anti-infectives   Start     Dose/Rate Route Frequency Ordered Stop   08/31/13 0800  vancomycin (VANCOCIN) 1,750 mg in sodium chloride 0.9 % 500 mL IVPB   Status:  Discontinued     1,750 mg 250 mL/hr over 120 Minutes Intravenous Every 24 hours 08/31/13 0753 08/31/13 1417   08/16/13 0800  vancomycin (VANCOCIN) 2,000 mg in sodium chloride 0.9 % 500 mL IVPB  Status:  Discontinued     2,000 mg 250 mL/hr over 120 Minutes Intravenous Every 24 hours 08/15/13 1411 08/31/13 0753   08/15/13 1500  vancomycin (VANCOCIN) 2,000 mg in sodium chloride 0.9 % 500 mL IVPB     2,000 mg 250 mL/hr over 120 Minutes Intravenous  Once 08/15/13 1411 08/15/13 1713   08/14/13 1600  piperacillin-tazobactam (ZOSYN) IVPB 3.375 g  Status:  Discontinued     3.375 g 12.5 mL/hr over 240 Minutes Intravenous Every 8 hours 08/14/13 1543 08/31/13 1417       Assessment/Plan  POD 41 Infected prosthetic mesh of abdominal wall with GIANT abscess s/p removal 08/14/2013 Dr. Michael Cross  [photo of wound on 09/23/2013 note] Entero-atmospheric fistula-on bowel rest, Sandostatin and TPN; fistula output has significantly increased since the drainage system was fixed.  Diabetes mellitus, insulin dependent (IDDM), uncontrolled  HYPERLIPIDEMIA  Obesity, Class III, BMI 40-49.9 (morbid obesity)  OBSTRUCTIVE SLEEP APNEA  Acute renal insufficiency from fistula/wound loss-improved  Deconditioning  Plan: 1.  Cont TNA, bowel rest, and pouch changes per WOC.    LOS: 44 days    Cross,Kurt E 09/24/2013, 8:48 AM Pager: [phone]  Agree with above.  Kurt Overall, MD, [facility] Surgery Pager: [phone] Office phone:  [phone]

## 2013-09-24 NOTE — Progress Notes (Signed)
Patient ID: Kurt Cross, male   DOB: July 15, 1951, 62 y.o.   MRN: [id number] [facility] PROGRESS NOTE  Kurt Cross [id number] DOB: Feb 23, 1951 DOA: 08/11/2013 PCP: [name], MD  Patient states feeling well, having no complaints.   Assessment/Plan  Infected prosthetic gore-tex mesh  - with large abscess s/p I&D and removal of gore-tex on 11/8. 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  - Continue sandostatin and TNA for now  - Continuous wall suction  - no significant clinical change  - pt will remain in the hospital for now as unable to go to LTAC in [place] and no available beds in [facility]  - due to coag neg staph s/p 2 weeks of vancomycin, resolved.  - off ABX  HTN  - blood pressure stable  - cont coreg and lasix, hydralazine, imdur  #4 atrial fibrillation/atrial flutter  - secondary to problem #1. Now ressolved, CE x 3 neg, 2 D ECHO EF 55%  - currently rate controlled on Coreg  - CHADSVASc score is 4 (not ASVD plaque on abd aorta), resumed eliquis  #5 chronic kidney disease stage III  - stable, Cr WNL  #6 leukocytosis/fever  - resolved.  #7 chronic combined systolic and diastolic CHF  - weight stable around 150 kg or 330lbs  - clinically compensated  OSA,  - stable. Continue CPAP.  #8 Uncontrolled Pain  - continue prn oxycodone as needed  #9 hyperlipidemia  - Continue statin.  #10 4.5 cm ascending aortic aneurysm per CT scan of 08/01/2013  - Stable. Asymptomatic.  #11 type 2 diabetes  - continue Lantus and SSI  #12. Hypomagnesemia  - supplemented by TPN  Superficial Fungal Infection -Start Clotrimazole cream  Diet: NPO with ice chips  Access: TL PICC  IVF: TPN  Proph: apixaban   Code Status: full  Family Communication: patient alone  Disposition Plan: Patient is not a good candidate for SNF due to complexity of care, including need for continuous TPN and continuous wall suction. These  interventions can easily be managed by an LTACH. The patient is otherwise stable but no bed available at [facility] and pt refusing to go to [facility] in [place].    Antibiotics  Vancomycin 08/15/13>>> 08/31/13  Zosyn 08/14/13>>>08/31/13  Procedures/Studies:  Ct Abdomen Pelvis Wo Contrast 08/14/2013 14.5 x 18.1 x 26.1 cm fluid collection/abscess within the anterior abdominal wall, as described above, incompletely visualized. The collection appears just anterior to prior ventral hernia mesh. Given the size and proximity to the skin surface, incision and drainage is suggested.  Dg Chest 2 View 08/01/2013 No active cardiopulmonary disease.  Dg Hip Complete Right 08/11/2013 No acute osseous injury of the right hip.  Ct Angio Chest Pe W/cm &/or Wo Cm 08/01/2013 4.5 cm AAA without complicating features. No evidence of acute pulmonary embolism or aortic dissection.  Dg Chest Port 1 View 08/18/2013 Right upper extremity PICC terminates in the mid superior vena cava. Stable congestive heart failure pattern.  Dg Chest Port 1 View 08/17/2013 Stable chest with changes of congestive heart failure with pulmonary interstitial edema. Cardiac pacer in stable position.  Dg Chest Portable 1 View 08/15/2013 Stable cardiomegaly with interval worsening of pulmonary vascular congestion/ edema relative to 08/13/2013.  Dg Chest Port 1 View 08/13/2013 No acute findings. Stable cardiomegaly and pulmonary venous prominence without overt edema.  2-D echo 08/12/2013  Incision and drainage of abscess, Gore-Tex mesh removal, debridement of abdominal wall and peritoneum Consultants:  Cardiology: Dr. Terrence Dupont 08/13/2013 -  signed off  General surgery:  ID: Dr Megan Salon 08/15/13 -- signed off   HPI/Subjective: No events overnight.   Objective: Filed Vitals:   09/23/13 2020 09/23/13 2329 09/24/13 0544 09/24/13 1503  BP: 149/74  152/94 160/71  Pulse: 61 65 63 70  Temp: 99.1 F (37.3 C)  98.3 F (36.8 C) 98.4 F (36.9 C)  TempSrc:  Oral  Oral Oral  Resp: 18 18 16 18   Height:      Weight:   146.7 kg (323 lb 6.6 oz)   SpO2: 100% 98% 99% 98%    Intake/Output Summary (Last 24 hours) at 09/24/13 1630 Last data filed at 09/24/13 1500  Gross per 24 hour  Intake   3720 ml  Output   2500 ml  Net   1220 ml    Exam:   General:  Pt is alert, follows commands appropriately, not in acute distress  Cardiovascular: Regular rate and rhythm, S1/S2, no murmurs, no rubs, no gallops  Respiratory: Clear to auscultation bilaterally, no wheezing, no crackles, no rhonchi  Abdomen: Soft, non tender, non distended, open abdominal wound with drainage   Extremities: No edema, pulses DP and PT palpable bilaterally  Neuro: Grossly nonfocal  Data Reviewed: Basic Metabolic Panel:  Recent Labs Lab 09/18/13 0339 09/20/13 0400 09/22/13 0410 09/23/13 0525  NA 133* 132* 134* 132*  K 4.2 3.8 3.9 3.6  CL 97 94* 98 95*  CO2 27 29 29 28   GLUCOSE 110* 103* 116* 153*  BUN 23 24* 24* 24*  CREATININE 0.98 1.02 1.03 1.00  CALCIUM 9.1 8.9 9.0 8.8  MG  --  1.6  --  1.7  PHOS  --  4.9*  --  4.7*   Liver Function Tests:  Recent Labs Lab 09/18/13 0339 09/20/13 0400 09/23/13 0525  AST 59* 43* 45*  ALT 71* 51 48  ALKPHOS 98 89 81  BILITOT 1.0 1.0 1.1  PROT 7.4 7.3 7.3  ALBUMIN 2.1* 2.2* 2.2*   CBC:  Recent Labs Lab 09/20/13 0400 09/22/13 0410 09/23/13 1135  WBC 8.8 7.6 8.5  NEUTROABS 4.8  --   --   HGB 10.5* 10.3* 10.7*  HCT 30.3* 30.7* 32.3*  MCV 91.0 91.9 92.0  PLT 334 314 321   CBG:  Recent Labs Lab 09/23/13 2005 09/24/13 0004 09/24/13 0406 09/24/13 0741 09/24/13 1114  GLUCAP 163* 152* 159* 154* 144*    Scheduled Meds: . acetaminophen  1,000 mg Oral TID  . antiseptic oral rinse  15 mL Mouth Rinse q12n4p  . apixaban  5 mg Oral BID  . atorvastatin  40 mg Oral QODAY  . carvedilol  3.125 mg Oral BID WC  . clotrimazole   Topical BID  . ferrous sulfate  325 mg Oral BID  . furosemide  40 mg Oral Daily   . hydrALAZINE  50 mg Oral Q8H  . insulin aspart  0-9 Units Subcutaneous Q4H  . insulin glargine  30 Units Subcutaneous QHS  . isosorbide mononitrate  15 mg Oral Daily  . lip balm  1 application Topical BID  . neomycin-bacitracin-polymyxin   Topical BID  . octreotide  150 mcg Subcutaneous TID  . OxyCODONE  15 mg Oral Q12H  . potassium chloride  40 mEq Oral Daily  . saccharomyces boulardii  250 mg Oral BID  . sodium chloride  3 mL Intravenous Q12H  . zolpidem  10 mg Oral QHS   Continuous Infusions: . sodium chloride 20 mL/hr at 09/24/13 0406  . [name] KitchenTPN (CLINIMIX-E)  Adult 125 mL/hr at 09/23/13 1719   And  . fat emulsion 250 mL (09/23/13 1718)  . [name] KitchenTPN (CLINIMIX-E) Adult     And  . fat emulsion     Kelvin Cellar, MD  [place] Pager [phone]  If 7PM-7AM, please contact night-coverage [email or web address] Password [email or web address] 09/24/2013, 4:30 PM   LOS: 44 days

## 2013-09-24 NOTE — Progress Notes (Signed)
PARENTERAL NUTRITION CONSULT NOTE - FOLLOW UP  Pharmacy Consult for TNA Indication: Enterocutaneous fistula  No Known Allergies  Patient Measurements: Height: 6' (182.9 cm) Weight: 323 lb 6.6 oz (146.7 kg) IBW/kg (Calculated) : 77.6 Adjusted Body Weight: 102.6kg  Vital Signs: Temp: 98.3 F (36.8 C) (12/19 0544) Temp src: Oral (12/19 0544) BP: 152/94 mmHg (12/19 0544) Pulse Rate: 63 (12/19 0544) Intake/Output from previous day: 12/18 0701 - 12/19 0700 In: 3720 [I.V.:480; TPN:3240] Out: 2950 [Urine:1950; Drains:1000]  Labs:  Recent Labs  09/22/13 0410 09/23/13 1135  WBC 7.6 8.5  HGB 10.3* 10.7*  HCT 30.7* 32.3*  PLT 314 321    Recent Labs  09/22/13 0410 09/23/13 0525  NA 134* 132*  K 3.9 3.6  CL 98 95*  CO2 29 28  GLUCOSE 116* 153*  BUN 24* 24*  CREATININE 1.03 1.00  CALCIUM 9.0 8.8  MG  --  1.7  PHOS  --  4.7*  PROT  --  7.3  ALBUMIN  --  2.2*  AST  --  45*  ALT  --  48  ALKPHOS  --  81  BILITOT  --  1.1    Recent Labs  09/24/13 0004 09/24/13 0406 09/24/13 0741  GLUCAP 152* 159* 154*   Insulin Requirements in the past 24 hours:   Lantus 30 units qhs.  Note Lantus requirements fluctuate recently with a few hypoglycemic events.  Required 13 units SSI/24h  - CBG q4h with sensitive scale coverage TNA contains regular insulin 35 units per 3L bag (11.7 units/L)  Nutritional Goals:   Re-estimated per RD 12/2:  2370-2650 kcal/day, 160-180 grams/day protein, 3.3-3.5 L/day fluid  Clinimix E5/15 at goal rate 125 ml/hr (3L/day) with 20% lipids at 10 ml/hr to provide 150g protein and 2675 kcal daily.  Due to how much fluid can be placed in TNA bag, maximum volume is 3L.   Current Nutrition:   NPO except for ice chips, sips with meds  Clinimix E5/15 at 145ml/hr and 20% lipids at 10 ml/hr over 24 hours  IVF: NS at [place]: 62 yo male with large abdominal abscess with infected old Gore Tex mesh s/p I&D abscess, removal of mesh, debridement  of abdominal wall and peritoneum. 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 TNA started 11/12 at 10pm.  Patient currently unable to be discharged to a facility nearby that can accommodate both TNA and drainage system.   12/19: Pt continues 24 hour continuous TPN.  Per surgery, fistula output thickening thus will require close monitoring for possible clogging and need for flushing drain.  Per surgery note 12/15, wound decreasing in size. Continuing sandostatin and TNA as patient on bowel rest. Remains on continuous wall suction and unable to transfer to SNF (insurance denied local LTAC)  Labs:   Glucose: Hx DM on insulin PTA. CBGs slightly above goal with increase SSI use last 24h, hypoglycemia resolved with decreased lantus and regular insulin in TPN 12/14  Electrolytes: No new labs 12/19am.  Na 132, K = 3.6, Phos = 4.7 (stable), Mg = 1.7, Corr Ca = 10.24 (Ca x Phos = 48)  Renal:  SCr stable/wnl.  LFTs: AST slightly improved, ALT WNL.  Albumin 2.2  TGs: 101 (12/15)  Prealbumin: 10.3 (11/13), 11.1 (11/17), 11.2(11/24), 13.6 (12/1), 10.3 (12/8), 13.4 (12/15)  TPN Access: Triple lumen PICC placed 11/12 TPN day#: 37  Plan:   Continue Clinimix E 5/15 at 125 ml/hr with lipids 20% at 10 ml/hr over 24 hours.  Increase insulin 40 units per 3L bag (13.3 units/L) d/t increased SSI use  TNA to contain standard multivitamins and trace elements.  Continue CBG checks q4h with sensitive SSI coverage.     Continue Lantus 30 units QHS.  KCl 23mEq PO daily per MD  Continue NS at [place] on Mondays & Thursdays.   F/u discharge plans and TNA arrangement.  Doreene Eland, PharmD, BCPS.   Pager: [phone] 09/24/2013 8:20 AM

## 2013-09-25 LAB — GLUCOSE, CAPILLARY
Glucose-Capillary: 154 mg/dL — ABNORMAL HIGH (ref 70–99)
Glucose-Capillary: 154 mg/dL — ABNORMAL HIGH (ref 70–99)
Glucose-Capillary: 165 mg/dL — ABNORMAL HIGH (ref 70–99)
Glucose-Capillary: 181 mg/dL — ABNORMAL HIGH (ref 70–99)

## 2013-09-25 MED ORDER — TRACE MINERALS CR-CU-F-FE-I-MN-MO-SE-ZN IV SOLN
INTRAVENOUS | Status: AC
  Administered 2013-09-25: 18:00:00 via INTRAVENOUS
  Filled 2013-09-25: qty 3000

## 2013-09-25 MED ORDER — FAT EMULSION 20 % IV EMUL
250.0000 mL | INTRAVENOUS | Status: AC
  Administered 2013-09-25: 250 mL via INTRAVENOUS
  Filled 2013-09-25: qty 250

## 2013-09-25 NOTE — Progress Notes (Signed)
Patient ID: Kurt Cross, male   DOB: [birth year], 62 y.o.   MRN: [id number] [facility] PROGRESS NOTE  JACSON VIROLA [id number] DOB: 30-Oct-1950 DOA: 08/11/2013 PCP: Kurt Gravel, MD  Patient states feeling well, having no complaints.   Assessment/Plan  Infected prosthetic gore-tex mesh  - with large abscess s/p I&D and removal of gore-tex on 11/8. 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  - Continue sandostatin and TNA for now  - Continuous wall suction  - no significant clinical change  - pt will remain in the hospital for now as unable to go to LTAC in [place] and no available beds in [facility] Sepsis  - due to coag neg staph s/p 2 weeks of vancomycin, resolved.  - off ABX  HTN  - blood pressure stable  - cont coreg and lasix, hydralazine, imdur  #4 atrial fibrillation/atrial flutter  - secondary to problem #1. Now ressolved, CE x 3 neg, 2 D ECHO EF 55%  - currently rate controlled on Coreg  - CHADSVASc score is 4 (not ASVD plaque on abd aorta), resumed eliquis  #5 chronic kidney disease stage III  - stable, Cr WNL  #6 leukocytosis/fever  - resolved.  #7 chronic combined systolic and diastolic CHF  - weight stable around 150 kg or 330lbs  - clinically compensated  OSA,  - stable. Continue CPAP.  #8 Uncontrolled Pain  - continue prn oxycodone as needed  #9 hyperlipidemia  - Continue statin.  #10 4.5 cm ascending aortic aneurysm per CT scan of 08/01/2013  - Stable. Asymptomatic.  #11 type 2 diabetes  - continue Lantus and SSI  #12. Hypomagnesemia  - supplemented by TPN  Superficial Fungal Infection -Start Clotrimazole cream  Diet: NPO with ice chips  Access: TL PICC  IVF: TPN  Proph: apixaban   Code Status: full  Family Communication: patient alone  Disposition Plan: Patient is not a good candidate for SNF due to complexity of care, including need for continuous TPN and continuous wall suction. These  interventions can easily be managed by an LTACH. The patient is otherwise stable but no bed available at [facility] and pt refusing to go to [facility] in [place].    Antibiotics  Vancomycin 08/15/13>>> 08/31/13  Zosyn 08/14/13>>>08/31/13  Procedures/Studies:  Ct Abdomen Pelvis Wo Contrast 08/14/2013 14.5 x 18.1 x 26.1 cm fluid collection/abscess within the anterior abdominal wall, as described above, incompletely visualized. The collection appears just anterior to prior ventral hernia mesh. Given the size and proximity to the skin surface, incision and drainage is suggested.  Dg Chest 2 View 08/01/2013 No active cardiopulmonary disease.  Dg Hip Complete Right 08/11/2013 No acute osseous injury of the right hip.  Ct Angio Chest Pe W/cm &/or Wo Cm 08/01/2013 4.5 cm AAA without complicating features. No evidence of acute pulmonary embolism or aortic dissection.  Dg Chest Port 1 View 08/18/2013 Right upper extremity PICC terminates in the mid superior vena cava. Stable congestive heart failure pattern.  Dg Chest Port 1 View 08/17/2013 Stable chest with changes of congestive heart failure with pulmonary interstitial edema. Cardiac pacer in stable position.  Dg Chest Portable 1 View 08/15/2013 Stable cardiomegaly with interval worsening of pulmonary vascular congestion/ edema relative to 08/13/2013.  Dg Chest Port 1 View 08/13/2013 No acute findings. Stable cardiomegaly and pulmonary venous prominence without overt edema.  2-D echo 08/12/2013  Incision and drainage of abscess, Gore-Tex mesh removal, debridement of abdominal wall and peritoneum Consultants:  Cardiology: Dr. Terrence Dupont 08/13/2013 -  signed off  General surgery:  ID: Dr Megan Salon 08/15/13 -- signed off   HPI/Subjective: No events overnight.   Objective: Filed Vitals:   09/24/13 1503 09/24/13 2107 09/25/13 0418 09/25/13 0749  BP: 160/71 139/56 141/77 150/72  Pulse: 70 58 65 64  Temp: 98.4 F (36.9 C) 98.9 F (37.2 C) 98.2 F (36.8 C)    TempSrc: Oral Oral Oral   Resp: 18 18 18    Height:      Weight:   142.7 kg (314 lb 9.5 oz)   SpO2: 98% 98% 98%     Intake/Output Summary (Last 24 hours) at 09/25/13 1321 Last data filed at 09/25/13 1100  Gross per 24 hour  Intake   3775 ml  Output   3050 ml  Net    725 ml    Exam:   General:  Pt is alert, follows commands appropriately, not in acute distress  Cardiovascular: Regular rate and rhythm, S1/S2, no murmurs, no rubs, no gallops  Respiratory: Clear to auscultation bilaterally, no wheezing, no crackles, no rhonchi  Abdomen: Soft, non tender, non distended, open abdominal wound with drainage   Extremities: No edema, pulses DP and PT palpable bilaterally  Neuro: Grossly nonfocal  Data Reviewed: Basic Metabolic Panel:  Recent Labs Lab 09/20/13 0400 09/22/13 0410 09/23/13 0525  NA 132* 134* 132*  K 3.8 3.9 3.6  CL 94* 98 95*  CO2 29 29 28   GLUCOSE 103* 116* 153*  BUN 24* 24* 24*  CREATININE 1.02 1.03 1.00  CALCIUM 8.9 9.0 8.8  MG 1.6  --  1.7  PHOS 4.9*  --  4.7*   Liver Function Tests:  Recent Labs Lab 09/20/13 0400 09/23/13 0525  AST 43* 45*  ALT 51 48  ALKPHOS 89 81  BILITOT 1.0 1.1  PROT 7.3 7.3  ALBUMIN 2.2* 2.2*   CBC:  Recent Labs Lab 09/20/13 0400 09/22/13 0410 09/23/13 1135  WBC 8.8 7.6 8.5  NEUTROABS 4.8  --   --   HGB 10.5* 10.3* 10.7*  HCT 30.3* 30.7* 32.3*  MCV 91.0 91.9 92.0  PLT 334 314 321   CBG:  Recent Labs Lab 09/24/13 1954 09/25/13 0014 09/25/13 0416 09/25/13 0714 09/25/13 1111  GLUCAP 168* 165* 181* 157* 148*    Scheduled Meds: . acetaminophen  1,000 mg Oral TID  . antiseptic oral rinse  15 mL Mouth Rinse q12n4p  . apixaban  5 mg Oral BID  . atorvastatin  40 mg Oral QODAY  . carvedilol  3.125 mg Oral BID WC  . clotrimazole   Topical BID  . ferrous sulfate  325 mg Oral TID  . furosemide  40 mg Oral Daily  . hydrALAZINE  50 mg Oral Q8H  . insulin aspart  0-9 Units Subcutaneous Q4H  . insulin  glargine  30 Units Subcutaneous QHS  . isosorbide mononitrate  15 mg Oral Daily  . lip balm  1 application Topical BID  . loperamide  4 mg Oral TID  . neomycin-bacitracin-polymyxin   Topical BID  . OxyCODONE  15 mg Oral Q12H  . potassium chloride  40 mEq Oral Daily  . sodium chloride  3 mL Intravenous Q12H  . zolpidem  10 mg Oral QHS   Continuous Infusions: . sodium chloride 20 mL/hr at 09/24/13 0406  . [name] KitchenTPN (CLINIMIX-E) Adult 125 mL/hr at 09/24/13 1729   And  . fat emulsion 250 mL (09/24/13 1729)  . [name] KitchenTPN (CLINIMIX-E) Adult     And  . fat emulsion  Kelvin Cellar, MD  [facility] Pager [phone]  If 7PM-7AM, please contact night-coverage [email or web address] Password [email or web address] 09/25/2013, 1:21 PM   LOS: 45 days

## 2013-09-25 NOTE — Progress Notes (Signed)
[name] [birth year]  CARE TEAM:  PCP: [name], MD  Outpatient Care Team: Patient Care Team: [name], MD as PCP - General (Internal Medicine) [name], MD as Consulting Physician (Cardiology) [name], MD as Consulting Physician (Pulmonary Disease)  Inpatient Treatment Team: Treatment Team: Attending Provider: Kelvin Cellar, MD; Consulting Physician: Nolon Nations, MD; Rounding Team: Threasa Beards, MD; Registered Nurse: Nadara Mode, RN; Registered Nurse: Sherran Needs, RN; Registered Nurse: [place], [place]; Technician: Burgess Estelle, NT; Registered Nurse: Ashley Murrain, RN; Physical Therapist: Nathanial Rancher, PTA; Technician: Harlen Labs, NT; Registered Nurse: Loyal Gambler, RN; Registered Nurse: Hoyle Barr, RN; Registered Nurse: Gomez Cleverly, RN; Technician: Mertha Baars, NT   Subjective:  No major events Denies abd pain Pouch w catch bag working Walking in hallways  Objective:  Vital signs:  Filed Vitals:   09/24/13 1503 09/24/13 2107 09/25/13 0418 09/25/13 0749  BP: 160/71 139/56 141/77 150/72  Pulse: 70 58 65 64  Temp: 98.4 F (36.9 C) 98.9 F (37.2 C) 98.2 F (36.8 C)   TempSrc: Oral Oral Oral   Resp: 18 18 18    Height:      Weight:   314 lb 9.5 oz (142.7 kg)   SpO2: 98% 98% 98%     Last BM Date: 09/21/13  Intake/Output   Yesterday:  12/19 0701 - 12/20 0700 In: 3775 [P.O.:90; I.V.:460; [id number] Out: 2100 [id number]; Drains:450] This shift:     Bowel function:  Flatus: y, in bag  BM: y, in bag  Drain: n/a  Physical Exam:  General: Pt awake/alert/oriented x4 in no acute distress Eyes: PERRL, normal EOM.  Sclera clear.  No icterus Neuro: CN II-XII intact w/o focal sensory/motor deficits. Lymph: No head/neck/groin lymphadenopathy Psych:  No delerium/psychosis/paranoia HENT: Normocephalic, Mucus membranes moist.  No thrush Neck: Supple, No tracheal deviation Chest: No chest  wall pain w good excursion CV:  Pulses intact.  Regular rhythm MS: Normal AROM mjr joints.  No obvious deformity Abdomen: Soft/obese.  Nondistended.  Central abd wound w pouch intact, tube catching output well.  No evidence of peritonitis.  No incarcerated hernias. Ext:  SCDs BLE.  No mjr edema.  No cyanosis Skin: No petechiae / purpura   Problem List:   Principal Problem:   Entero-atmospheric fistula Active Problems:   Infected prosthetic mesh of abdominal wall with GIANT abscess s/p removal 08/14/2013   Obesity, Class III, BMI 40-49.9 (morbid obesity)   Diabetes mellitus, insulin dependent (IDDM), uncontrolled   HYPERLIPIDEMIA   OBSTRUCTIVE SLEEP APNEA   Hypertension   Acute-on-chronic respiratory failure secondary to probable community-acquired pneumonia   Constipation, chronic   Chronic combined systolic and diastolic CHF (congestive heart failure)   Right leg pain   AAA (abdominal aortic aneurysm)/ 4.5 cm ascending per CT angio 08/01/13   Recurrent ventral incisional hernia   Acute renal failure (ARF)   Assessment  Kurt Cross  62 y.o. male  44 Days Post-Op  Procedure(s): INCISION AND DRAINAGE ABSCESS, mesh   EC fistula stabilizing  Plan:  -continue wound care with suctioned pouching system to control SB fistula - challenge for transition to outpy.  Perhaps switch to grav bag system off suction if output lowers & wound more closed/sealed w no more undermining  -TNA for high output SB fistula.  Perhaps retry cycling TNA?  -inc imodium & iron to slow output of fistula.  Stop sandostatin - no good evid for  SB fistula  -doubt SB fistula will close w/o surgery.  D/w CCS partners.  Agree w waiting until 6 months before attempting this - SB resection of fistulas would be needed.  Primary repair only will fail.  Massive fascial defect = need for major abd wall reconstruction probably w bilateral component separation & giant biologic retrorectal/preoperitoneal mesh underlay.   Reserve polypropylene mesh for recurrence after fistula closed/healed.  Best managed at [employer] center [place], [facility], [facility], etc) with regular experience with this.  I updated the patient's status to the pt.  Recommendations were made.  Questions were answered.  They expressed understanding & appreciation.  -VTE prophylaxis- SCDs, etc -mobilize as tolerated to help recovery  [name], M.D., F.A.C.S. Gastrointestinal and Minimally Invasive Surgery [facility] [address] [place], [place] [address] [phone] Main / Paging   09/25/2013   Results:   Labs: Results for orders placed during the hospital encounter of 08/11/13 (from the past 48 hour(s))  CBC     Status: Abnormal   Collection Time    09/23/13 11:35 AM      Result Value Range   WBC 8.5  4.0 - 10.5 K/uL   RBC 3.51 (*) 4.22 - 5.81 MIL/uL   Hemoglobin 10.7 (*) 13.0 - 17.0 g/dL   HCT 32.3 (*) 39.0 - 52.0 %   MCV 92.0  78.0 - 100.0 fL   MCH 30.5  26.0 - 34.0 pg   MCHC 33.1  30.0 - 36.0 g/dL   RDW 13.9  11.5 - 15.5 %   Platelets 321  150 - 400 K/uL  GLUCOSE, CAPILLARY     Status: Abnormal   Collection Time    09/23/13 11:48 AM      Result Value Range   Glucose-Capillary 164 (*) 70 - 99 mg/dL  GLUCOSE, CAPILLARY     Status: Abnormal   Collection Time    09/23/13  4:10 PM      Result Value Range   Glucose-Capillary 163 (*) 70 - 99 mg/dL  GLUCOSE, CAPILLARY     Status: Abnormal   Collection Time    09/23/13  8:05 PM      Result Value Range   Glucose-Capillary 163 (*) 70 - 99 mg/dL   Comment 1 Notify RN     Comment 2 Documented in Chart    GLUCOSE, CAPILLARY     Status: Abnormal   Collection Time    09/24/13 12:04 AM      Result Value Range   Glucose-Capillary 152 (*) 70 - 99 mg/dL   Comment 1 Notify RN     Comment 2 Documented in Chart    GLUCOSE, CAPILLARY     Status: Abnormal   Collection Time    09/24/13  4:06 AM      Result Value Range    Glucose-Capillary 159 (*) 70 - 99 mg/dL   Comment 1 Notify RN     Comment 2 Documented in Chart    GLUCOSE, CAPILLARY     Status: Abnormal   Collection Time    09/24/13  7:41 AM      Result Value Range   Glucose-Capillary 154 (*) 70 - 99 mg/dL  GLUCOSE, CAPILLARY     Status: Abnormal   Collection Time    09/24/13 11:14 AM      Result Value Range   Glucose-Capillary 144 (*) 70 - 99 mg/dL  GLUCOSE, CAPILLARY     Status: Abnormal   Collection  Time    09/24/13  4:50 PM      Result Value Range   Glucose-Capillary 175 (*) 70 - 99 mg/dL  GLUCOSE, CAPILLARY     Status: Abnormal   Collection Time    09/24/13  7:54 PM      Result Value Range   Glucose-Capillary 168 (*) 70 - 99 mg/dL  GLUCOSE, CAPILLARY     Status: Abnormal   Collection Time    09/25/13 12:14 AM      Result Value Range   Glucose-Capillary 165 (*) 70 - 99 mg/dL  GLUCOSE, CAPILLARY     Status: Abnormal   Collection Time    09/25/13  4:16 AM      Result Value Range   Glucose-Capillary 181 (*) 70 - 99 mg/dL  GLUCOSE, CAPILLARY     Status: Abnormal   Collection Time    09/25/13  7:14 AM      Result Value Range   Glucose-Capillary 157 (*) 70 - 99 mg/dL    Imaging / Studies: No results found.  Medications / Allergies: per chart  Antibiotics: Anti-infectives   Start     Dose/Rate Route Frequency Ordered Stop   08/31/13 0800  vancomycin (VANCOCIN) 1,750 mg in sodium chloride 0.9 % 500 mL IVPB  Status:  Discontinued     1,750 mg 250 mL/hr over 120 Minutes Intravenous Every 24 hours 08/31/13 0753 08/31/13 1417   08/16/13 0800  vancomycin (VANCOCIN) 2,000 mg in sodium chloride 0.9 % 500 mL IVPB  Status:  Discontinued     2,000 mg 250 mL/hr over 120 Minutes Intravenous Every 24 hours 08/15/13 1411 08/31/13 0753   08/15/13 1500  vancomycin (VANCOCIN) 2,000 mg in sodium chloride 0.9 % 500 mL IVPB     2,000 mg 250 mL/hr over 120 Minutes Intravenous  Once 08/15/13 1411 08/15/13 1713   08/14/13 1600   piperacillin-tazobactam (ZOSYN) IVPB 3.375 g  Status:  Discontinued     3.375 g 12.5 mL/hr over 240 Minutes Intravenous Every 8 hours 08/14/13 1543 08/31/13 1417       Note: This dictation was prepared with Dragon/digital dictation along with [employer]. Any transcriptional errors that result from this process are unintentional.

## 2013-09-25 NOTE — Progress Notes (Signed)
PARENTERAL NUTRITION CONSULT NOTE - FOLLOW UP  Pharmacy Consult for TNA Indication: Enterocutaneous fistula  No Known Allergies  Patient Measurements: Height: 6' (182.9 cm) Weight: 314 lb 9.5 oz (142.7 kg) IBW/kg (Calculated) : 77.6 Adjusted Body Weight: 102.6kg  Vital Signs: Temp: 98.2 F (36.8 C) (12/20 0418) Temp src: Oral (12/20 0418) BP: 150/72 mmHg (12/20 0749) Pulse Rate: 64 (12/20 0749) Intake/Output from previous day: 12/19 0701 - 12/20 0700 In: 3775 [P.O.:90; I.V.:460; TPN:3105] Out: 2100 [Urine:1650; Drains:450]  Labs:  Recent Labs  09/23/13 1135  WBC 8.5  HGB 10.7*  HCT 32.3*  PLT 321    Recent Labs  09/23/13 0525  NA 132*  K 3.6  CL 95*  CO2 28  GLUCOSE 153*  BUN 24*  CREATININE 1.00  CALCIUM 8.8  MG 1.7  PHOS 4.7*  PROT 7.3  ALBUMIN 2.2*  AST 45*  ALT 48  ALKPHOS 81  BILITOT 1.1    Recent Labs  09/25/13 0014 09/25/13 0416 09/25/13 0714  GLUCAP 165* 181* 157*   Insulin Requirements in the past 24 hours:   Lantus 30 units qhs.  Note Lantus requirements have fluctuated recently d/t occasional hypoglycemic events.  Required 10 units SSI/24h  - CBG q4h with sensitive scale coverage TNA contains regular insulin 40 units per 3L bag (13.3 units/L)  Nutritional Goals:   Re-estimated per RD 12/2:  2370-2650 kcal/day, 160-180 grams/day protein, 3.3-3.5 L/day fluid  Clinimix E5/15 at goal rate 125 ml/hr (3L/day) with 20% lipids at 10 ml/hr to provide 150g protein and 2675 kcal daily.  Due to how much fluid can be placed in TNA bag, maximum volume is 3L.   Current Nutrition:   NPO except for ice chips, sips with meds  Clinimix E5/15 at 123ml/hr and 20% lipids at 10 ml/hr over 24 hours  IVF: NS at [place]: 62 yo male with large abdominal abscess with infected old Gore Tex mesh s/p I&D abscess, removal of mesh, debridement of abdominal wall and peritoneum. 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 TNA started 11/12 at  10pm.  Patient currently unable to be discharged to a facility nearby that can accommodate both TNA and drainage system.   12/20: Pt continues 24 hour continuous TPN.  Per surgery, fistula output thickening thus will require close monitoring for possible clogging and need for flushing drain.  Per surgery note 12/15, wound decreasing in size. Sandostatin d/c'd today, imodium and iron increased to help slow output of fistula.   Remains on bowel rest. Remains on continuous wall suction and unable to transfer to SNF (insurance denied local LTAC).  CCS to wait until 6 months before attempting small bowel resection of fistula and recommend management at an experienced major [facility] center.    Labs:   Glucose: Hx DM on insulin PTA. CBGs remain slightly supratherapeutic despite increase in insulin in TNA, hypoglycemia resolved with decreased lantus and regular insulin in TPN 12/14  Electrolytes: No new labs 12/20 am.  Na 132, K = 3.6, Phos = 4.7 (stable), Mg = 1.7, Corr Ca = 10.24 (Ca x Phos = 48)  Renal:  SCr stable/wnl.  LFTs: AST slightly improved, ALT WNL.  Albumin 2.2  TGs: 101 (12/15)  Prealbumin: 10.3 (11/13), 11.1 (11/17), 11.2(11/24), 13.6 (12/1), 10.3 (12/8), 13.4 (12/15)  TPN Access: Triple lumen PICC placed 11/12 TPN day#: 38  Plan:   Continue Clinimix E 5/15 at 125 ml/hr with lipids 20% at 10 ml/hr over 24 hours.     Increase  to insulin 45 units per 3L bag (15 units/L) d/t elevated CBGs  TNA to contain standard multivitamins and trace elements.  Continue CBG checks q4h with sensitive SSI coverage.     Continue Lantus 30 units QHS.  KCl 29mEq PO daily per MD  Continue NS at [place] on Mondays & Thursdays.   F/u discharge plans and TNA arrangement.  Ralene Bathe, PharmD, BCPS 09/25/2013, 10:18 AM  Pager: [phone]

## 2013-09-25 NOTE — Progress Notes (Signed)
Physical Therapy Treatment Patient Details Name: Kurt Cross MRN: [id number] DOB: 04-Jul-1951 Today's Date: 09/25/2013 Time: [id number] PT Time Calculation (min): 16 min  PT Assessment / Plan / Recommendation  History of Present Illness Kurt Cross is a 62 y.o. male  with known history of chronic systolic heart failure status post defibrillator placement last EF measured was in 2013 was 35-40%, on home oxygen, OSA noncompliant with CPAP, diabetes mellitus, hypertension and hyperlipidemia presented to the ER because right leg pain after sustaining a fall the night prior to admission.  08/14/13: s/p abdominal abscess removal   PT Comments   Pt stated MD wants home to walk 3 times a day.  Will need nursing to amb remaining freq.  Reported to NT and placed instructions on pt's white board which is located on his bathroom door.  Assisted pt OOB to amb.  Pt tolerated well.   Follow Up Recommendations  SNF     Does the patient have the potential to tolerate intense rehabilitation     Barriers to Discharge        Equipment Recommendations       Recommendations for Other Services    Frequency Min 2X/week   Progress towards PT Goals Progress towards PT goals: Progressing toward goals  Plan      Precautions / Restrictions Precautions Precautions: Fall Precaution Comments: ostomy bag on R side and wall suction to ABD Restrictions Weight Bearing Restrictions: No    Pertinent Vitals/Pain No c/o pain    Mobility  Bed Mobility Bed Mobility: Supine to Sit Supine to Sit: 5: Supervision Details for Bed Mobility Assistance: increased time Transfers Transfers: Sit to Stand;Stand to Sit Sit to Stand: 4: Min guard;From bed Stand to Sit: 4: Min guard;To chair/3-in-1 Details for Transfer Assistance: Assisted OOB Ambulation/Gait Ambulation/Gait Assistance: 4: Min guard;5: Supervision Ambulation Distance (Feet): 100 Feet Assistive device: Rolling walker Ambulation/Gait Assistance Details:  Increased amb distance and tolerance this session.   Gait Pattern: Step-through pattern;Wide base of support;Decreased stride length;Ataxic Gait velocity: decreased     PT Goals (current goals can now be found in the care plan section)    Visit Information  Last PT Received On: 09/25/13 Assistance Needed: +2 (equipment/safety) History of Present Illness: Kurt Cross is a 62 y.o. male  with known history of chronic systolic heart failure status post defibrillator placement last EF measured was in 2013 was 35-40%, on home oxygen, OSA noncompliant with CPAP, diabetes mellitus, hypertension and hyperlipidemia presented to the ER because right leg pain after sustaining a fall the night prior to admission.  08/14/13: s/p abdominal abscess removal    Subjective Data      Cognition       Balance     End of Session PT - End of Session Equipment Utilized During Treatment: Gait belt Activity Tolerance: Patient limited by fatigue   Rica Koyanagi  PTA [facility]  Acute  Rehab Pager      [phone]

## 2013-09-25 NOTE — Progress Notes (Signed)
Pt placed on CPAP with 2 LPM o2 bleed in via FFM.  Pt tolerating well at this time, RT to monitor and assess as needed.

## 2013-09-26 LAB — GLUCOSE, CAPILLARY
Glucose-Capillary: 147 mg/dL — ABNORMAL HIGH (ref 70–99)
Glucose-Capillary: 144 mg/dL — ABNORMAL HIGH (ref 70–99)
Glucose-Capillary: 176 mg/dL — ABNORMAL HIGH (ref 70–99)

## 2013-09-26 MED ORDER — FAT EMULSION 20 % IV EMUL
250.0000 mL | INTRAVENOUS | Status: AC
  Administered 2013-09-26: 250 mL via INTRAVENOUS
  Filled 2013-09-26: qty 250

## 2013-09-26 MED ORDER — VITAMINS A & D EX OINT
TOPICAL_OINTMENT | CUTANEOUS | Status: AC
  Administered 2013-09-26: 19:00:00
  Filled 2013-09-26: qty 10

## 2013-09-26 MED ORDER — TRACE MINERALS CR-CU-F-FE-I-MN-MO-SE-ZN IV SOLN
INTRAVENOUS | Status: AC
  Administered 2013-09-26: 18:00:00 via INTRAVENOUS
  Filled 2013-09-26: qty 3000

## 2013-09-26 MED ORDER — LOPERAMIDE HCL 2 MG PO CAPS
4.0000 mg | ORAL_CAPSULE | Freq: Four times a day (QID) | ORAL | Status: DC
  Administered 2013-09-26 – 2013-10-06 (×43): 4 mg via ORAL
  Filled 2013-09-26 (×53): qty 2

## 2013-09-26 MED ORDER — DIPHENOXYLATE-ATROPINE 2.5-0.025 MG PO TABS: 1.0000 | ORAL_TABLET | Freq: Four times a day (QID) | ORAL | Status: DC | PRN

## 2013-09-26 NOTE — Progress Notes (Signed)
Kurt Cross [id number] 18-Jan-1951  CARE TEAM:  PCP: [name], MD  Outpatient Care Team: Patient Care Team: [name], MD as PCP - General (Internal Medicine) [name], MD as Consulting Physician (Cardiology) [name], MD as Consulting Physician (Pulmonary Disease)  Inpatient Treatment Team: Treatment Team: Attending Provider: Kelvin Cellar, MD; Consulting Physician: Nolon Nations, MD; Rounding Team: Threasa Beards, MD; Registered Nurse: Nadara Mode, RN; Registered Nurse: Sherran Needs, RN; Registered Nurse: [place], [place]; Technician: Burgess Estelle, NT; Registered Nurse: Ashley Murrain, RN; Technician: Harlen Labs, NT; Registered Nurse: Loyal Gambler, RN; Registered Nurse: Hoyle Barr, RN; Registered Nurse: Gomez Cleverly, RN; Registered Nurse: Louis Matte, RN; Respiratory Therapist: Lowella Curb, RRT; Registered Nurse: Erick Blinks, RN   Subjective:  No major events Denies abd pain Pouch w catch bag working Walking in hallways  Objective:  Vital signs:  Filed Vitals:   09/25/13 1404 09/25/13 2100 09/25/13 2358 09/26/13 0419  BP: 137/78 143/86  142/65  Pulse: 59 85 69 62  Temp: 98.4 F (36.9 C) 98.6 F (37 C)  97.4 F (36.3 C)  TempSrc: Oral Oral  Oral  Resp: 16 16 18 18   Height:      Weight:    332 lb 0.2 oz (150.6 kg)  SpO2: 100% 100% 99% 100%    Last BM Date: 09/21/13  Intake/Output   Yesterday:  12/20 0701 - 12/21 0700 In: 3875 [I.V.:500; [id number] Out: 2050 [Urine:1350; Drains:700] This shift:     Bowel function:  Flatus: y, in bag  BM: y, in bag  Drain: n/a  Physical Exam:  General: Pt awake/alert/oriented x4 in no acute distress Eyes: PERRL, normal EOM.  Sclera clear.  No icterus Neuro: CN II-XII intact w/o focal sensory/motor deficits. Lymph: No head/neck/groin lymphadenopathy Psych:  No delerium/psychosis/paranoia HENT: Normocephalic, Mucus membranes moist.  No thrush Neck: Supple,  No tracheal deviation Chest: No chest wall pain w good excursion CV:  Pulses intact.  Regular rhythm MS: Normal AROM mjr joints.  No obvious deformity Abdomen: Soft/obese.  Nondistended.  Central abd wound w pouch intact, tube catching output well.  No evidence of peritonitis.  No incarcerated hernias. Ext:  SCDs BLE.  No mjr edema.  No cyanosis Skin: No petechiae / purpura   Problem List:   Principal Problem:   Entero-atmospheric fistula Active Problems:   Infected prosthetic mesh of abdominal wall with GIANT abscess s/p removal 08/14/2013   Obesity, Class III, BMI 40-49.9 (morbid obesity)   Diabetes mellitus, insulin dependent (IDDM), uncontrolled   HYPERLIPIDEMIA   OBSTRUCTIVE SLEEP APNEA   Hypertension   Acute-on-chronic respiratory failure secondary to probable community-acquired pneumonia   Constipation, chronic   Chronic combined systolic and diastolic CHF (congestive heart failure)   Right leg pain   AAA (abdominal aortic aneurysm)/ 4.5 cm ascending per CT angio 08/01/13   Recurrent ventral incisional hernia   Acute renal failure (ARF)   Assessment  Kurt Cross  62 y.o. male  65 Days Post-Op  Procedure(s): INCISION AND DRAINAGE ABSCESS, mesh   EC fistula stabilizing  Plan:  -continue wound care with suctioned pouching system to control SB fistula - challenge for transition to outpy.  Perhaps switch to grav bag system off suction if output lowers & wound more closed/sealed w no more undermining  -TNA for high output SB fistula.  Retry cycling TNA?  -Max imodium & iron to slow output of fistula.  PRN Lomotil.  Stop sandostatin - no good evid for SB fistula  -doubt SB fistula will close w/o surgery.  D/w [facility] partners.  Agree w waiting until 6 months before attempting this - SB resection of fistulas would be needed.  Primary repair only will fail.  Massive fascial defect = need for major abd wall reconstruction probably w bilateral component separation & giant  biologic retrorectal/preoperitoneal mesh underlay.  Reserve polypropylene mesh for recurrence after fistula closed/healed.  Best managed at [employer] center [place], [facility], [facility], etc) with regular experience with this. -VTE prophylaxis- SCDs, etc  -mobilize as tolerated to help recovery  I updated the patient's status to the pt.  Recommendations were made.  Questions were answered.  He expressed understanding & appreciation.    [name], M.D., F.A.C.S. Gastrointestinal and Minimally Invasive Surgery [facility] [address], [place], [place] [address] [phone] Main / Paging   09/26/2013   Results:   Labs: Results for orders placed during the hospital encounter of 08/11/13 (from the past 48 hour(s))  GLUCOSE, CAPILLARY     Status: Abnormal   Collection Time    09/24/13 11:14 AM      Result Value Range   Glucose-Capillary 144 (*) 70 - 99 mg/dL  GLUCOSE, CAPILLARY     Status: Abnormal   Collection Time    09/24/13  4:50 PM      Result Value Range   Glucose-Capillary 175 (*) 70 - 99 mg/dL  GLUCOSE, CAPILLARY     Status: Abnormal   Collection Time    09/24/13  7:54 PM      Result Value Range   Glucose-Capillary 168 (*) 70 - 99 mg/dL  GLUCOSE, CAPILLARY     Status: Abnormal   Collection Time    09/25/13 12:14 AM      Result Value Range   Glucose-Capillary 165 (*) 70 - 99 mg/dL  GLUCOSE, CAPILLARY     Status: Abnormal   Collection Time    09/25/13  4:16 AM      Result Value Range   Glucose-Capillary 181 (*) 70 - 99 mg/dL  GLUCOSE, CAPILLARY     Status: Abnormal   Collection Time    09/25/13  7:14 AM      Result Value Range   Glucose-Capillary 157 (*) 70 - 99 mg/dL  GLUCOSE, CAPILLARY     Status: Abnormal   Collection Time    09/25/13 11:11 AM      Result Value Range   Glucose-Capillary 148 (*) 70 - 99 mg/dL  GLUCOSE, CAPILLARY     Status: Abnormal   Collection Time    09/25/13  4:35 PM      Result Value Range    Glucose-Capillary 154 (*) 70 - 99 mg/dL  GLUCOSE, CAPILLARY     Status: Abnormal   Collection Time    09/25/13  7:28 PM      Result Value Range   Glucose-Capillary 154 (*) 70 - 99 mg/dL  GLUCOSE, CAPILLARY     Status: Abnormal   Collection Time    09/25/13 11:29 PM      Result Value Range   Glucose-Capillary 144 (*) 70 - 99 mg/dL  GLUCOSE, CAPILLARY     Status: Abnormal   Collection Time    09/26/13  4:16 AM      Result Value Range   Glucose-Capillary 147 (*) 70 - 99 mg/dL  GLUCOSE, CAPILLARY     Status: Abnormal   Collection Time  09/26/13  7:25 AM      Result Value Range   Glucose-Capillary 144 (*) 70 - 99 mg/dL   Comment 1 Notify RN      Imaging / Studies: No results found.  Medications / Allergies: per chart  Antibiotics: Anti-infectives   Start     Dose/Rate Route Frequency Ordered Stop   08/31/13 0800  vancomycin (VANCOCIN) 1,750 mg in sodium chloride 0.9 % 500 mL IVPB  Status:  Discontinued     1,750 mg 250 mL/hr over 120 Minutes Intravenous Every 24 hours 08/31/13 0753 08/31/13 1417   08/16/13 0800  vancomycin (VANCOCIN) 2,000 mg in sodium chloride 0.9 % 500 mL IVPB  Status:  Discontinued     2,000 mg 250 mL/hr over 120 Minutes Intravenous Every 24 hours 08/15/13 1411 08/31/13 0753   08/15/13 1500  vancomycin (VANCOCIN) 2,000 mg in sodium chloride 0.9 % 500 mL IVPB     2,000 mg 250 mL/hr over 120 Minutes Intravenous  Once 08/15/13 1411 08/15/13 1713   08/14/13 1600  piperacillin-tazobactam (ZOSYN) IVPB 3.375 g  Status:  Discontinued     3.375 g 12.5 mL/hr over 240 Minutes Intravenous Every 8 hours 08/14/13 1543 08/31/13 1417       Note: This dictation was prepared with Dragon/digital dictation along with [employer]. Any transcriptional errors that result from this process are unintentional.

## 2013-09-26 NOTE — Progress Notes (Addendum)
PARENTERAL NUTRITION CONSULT NOTE - FOLLOW UP  Pharmacy Consult for TNA Indication: Enterocutaneous fistula  No Known Allergies  Patient Measurements: Height: 6' (182.9 cm) Weight: 332 lb 0.2 oz (150.6 kg) IBW/kg (Calculated) : 77.6 Adjusted Body Weight: 102.6kg  Vital Signs: Temp: 97.4 F (36.3 C) (12/21 0419) Temp src: Oral (12/21 0419) BP: 142/65 mmHg (12/21 0419) Pulse Rate: 62 (12/21 0419) Intake/Output from previous day: 12/20 0701 - 12/21 0700 In: 3875 [I.V.:500; TPN:3375] Out: 2050 [Urine:1350; Drains:700]  Labs:  Recent Labs  09/23/13 1135  WBC 8.5  HGB 10.7*  HCT 32.3*  PLT 321   No results found for this basename: NA, K, CL, CO2, GLUCOSE, BUN, CREATININE, LABCREA, CREAT24HRUR, CALCIUM, MG, PHOS, PROT, ALBUMIN, AST, ALT, ALKPHOS, BILITOT, BILIDIR, IBILI, PREALBUMIN, TRIG, CHOLHDL, CHOL,  in the last 72 hours  Recent Labs  09/25/13 2329 09/26/13 0416 09/26/13 0725  GLUCAP 144* 147* 144*   Insulin Requirements in the past 24 hours:   Lantus 30 units qhs.  Note Lantus requirements have fluctuated recently d/t occasional hypoglycemic events.  Required 7 units SSI/24h  - CBG q4h with sensitive scale coverage TNA contains regular insulin 45 units per 3L bag (13.3 units/L)  Nutritional Goals:   Re-estimated per RD 12/2:  2370-2650 kcal/day, 160-180 grams/day protein, 3.3-3.5 L/day fluid  Clinimix E5/15 at goal rate 125 ml/hr (3L/day) with 20% lipids at 10 ml/hr to provide 150g protein and 2675 kcal daily.  Due to how much fluid can be placed in TNA bag, maximum volume is 3L.   Current Nutrition:   NPO except for ice chips, sips with meds  Clinimix E5/15 at 143ml/hr and 20% lipids at 10 ml/hr over 24 hours  IVF: NS at [place]: 62 yo male with large abdominal abscess with infected old Gore Tex mesh s/p I&D abscess, removal of mesh, debridement of abdominal wall and peritoneum. 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 TNA started 11/12  at 10pm.  Patient currently unable to be discharged to a facility nearby that can accommodate both TNA and drainage system.   12/21: Pt continues 24 hour continuous TPN.  Per surgery, fistula output thickening thus will require close monitoring for possible clogging and need for flushing drain.  Per surgery note 12/15, wound decreasing in size. Sandostatin d/c'd, imodium and iron increased to help slow output of fistula.   Remains on bowel rest. Remains on continuous wall suction and unable to transfer to SNF (insurance denied local LTAC).  CCS to wait until 6 months before attempting small bowel resection of fistula and recommend management at an experienced major academic center.    Labs:   Glucose: Hx DM on insulin PTA. CBGs overnight therapeutic after increase in insulin in TNA, hypoglycemia resolved with decreased lantus and regular insulin in TPN 12/14  Electrolytes: No new labs 12/21 am.  Na 132, K = 3.6, Phos = 4.7 (stable), Mg = 1.7, Corr Ca = 10.24 (Ca x Phos = 48)  Renal:  SCr stable/wnl.  LFTs: AST slightly improved, ALT WNL.  Albumin 2.2  TGs: 101 (12/15)  Prealbumin: 10.3 (11/13), 11.1 (11/17), 11.2(11/24), 13.6 (12/1), 10.3 (12/8), 13.4 (12/15)  TPN Access: Triple lumen PICC placed 11/12 TPN day#: 39  Plan:   Continue Clinimix E 5/15 at 125 ml/hr with lipids 20% at 10 ml/hr over 24 hours.     Continue with insulin 45 units per 3L bag (15 units/L)  TNA to contain standard multivitamins and trace elements.  Continue CBG checks q4h  with sensitive SSI coverage.     Continue Lantus 30 units QHS.  KCl 77mEq PO daily per MD  Continue NS at [place] on Mondays & Thursdays.   F/u discharge plans and TNA arrangement.  Ralene Bathe, PharmD, BCPS 09/26/2013, 8:23 AM  Pager: [phone]   ADDENDUM: [facility] would like to retry cyclic TNA to help discharge placement planning.     Discussed with social worker who relayed that cyclic vs continuous TNA will not  make a difference for placement, unfortunately.   Pt had initial trial of cyclic TNA in early December with hypoglycemia events during 6 hours off TNA.  Family very upset and against further cyclic trials.  Since then, lantus dose has decreased by ~50%.     Pt has CHF, last EF 50-55% 08/2013.  In patients with decreased fluid requirements (renal insufficiency, CHF, cirrhotic ascites, pulmonary disease), usually consider the max hourly fluid rate (TPN + MIVF) of [id number] for cyclic TPN. Pt currently receiving 135 ml/hr (TNA + lipids).    Recommendations: Would be hesitant to change to cyclic TNA with an increase in fluids to ~200 ml/hr when no advantage in regards to discharge planning but with risks of fluid overload and hypoglycemia.    Spoke with [facility] who is ok with plan to continue current TNA over 24 hours.    Ralene Bathe, PharmD, BCPS 09/26/2013, 9:42 AM  Pager: [phone]

## 2013-09-26 NOTE — Consult Note (Addendum)
WOC wound follow up Wound type:Pt with abd full thickness wound and fistula stomatized at 5:00 o'clock in wound bed. Right lower abd with healing full thickness previous stoma site; .3X2X.2cm, 100% red and moist, no odor or drainage.   Measurement:  Middle abd wound continues to decrease in size.  15.5X10.5X1.5cm. Remains with undermining to wound edges as previously mentioned. Wound bed: 95% beefy red, 5% yellow Drainage (amount, consistency, odor) Continues to have large amt brown drainage to continuous wall suction. Periwound: Skin surrounding wound intact. Dressing procedure/placement/frequency: Changed Eakin pouch; has maintained seal for 4 days.  Wound size allows medium Eakin pouch to be applied instead of a large.  Spout on this pouch is located at 6:00 o'clock, which would make it easier for pt to empty if continuous suction is not available at another facility. Applied Mepitel silicone contact layer over open wound. Inserted red rubber catheter to continuous wall suction on peripheral edge inside Eakin pouch to maintain seal.  Pt is well-informed and states he knows to avoid having suction catheter located over the open wound area. He is able to connect on and off suction to ambulate.   He states the medium Eakin pouch is much more comfortable than the previous large one he was wearing.  Discussed that this size of pouch is currently not available in the [place] store room for use, but it would give another facility a reference point of which size to order to facilitate emptying.  He states he feels very encouraged by the wound size and how the appearance is progressing compaired to when it initially occurred, and he looked at the wound with a hand mirror.  Extra supplies of large Eakin pouches are available at bedside for staff use if leaking should occur. Julien Girt MSN, RN, [place], [place], [place]

## 2013-09-26 NOTE — Progress Notes (Signed)
Patient ID: Kurt Cross, male   DOB: 07/21/1951, 62 y.o.   MRN: [id number] [facility] PROGRESS NOTE  Kurt Cross [id number] DOB: 08-Mar-1951 DOA: 08/11/2013 PCP: [name], MD  Patient remains stable, doing well. He ambulated to be in the hallway and back without stopping. He feels that he is getting his strength back.  Assessment/Plan  Infected prosthetic gore-tex mesh  - with large abscess s/p I&D and removal of gore-tex on 11/8. 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  - Continue sandostatin and TNA for now  - Continuous wall suction  - no significant clinical change  - pt will remain in the hospital for now as unable to go to LTAC in [place] and no available beds in [facility] S[facility]is  - due to coag neg staph s/p 2 weeks of vancomycin, resolved.  - off ABX  HTN  - blood pressure stable  - cont coreg and lasix, hydralazine, imdur  #4 atrial fibrillation/atrial flutter  - secondary to problem #1. Now ressolved, CE x 3 neg, 2 D ECHO EF 55%  - currently rate controlled on Coreg  - CHADSVASc score is 4 (not ASVD plaque on abd aorta), resumed eliquis  #5 chronic kidney disease stage III  - stable, Cr WNL  #6 leukocytosis/fever  - resolved.  #7 chronic combined systolic and diastolic CHF  - weight stable around 150 kg or 330lbs  - clinically compensated  OSA,  - stable. Continue CPAP.  #8 Uncontrolled Pain  - continue prn oxycodone as needed  #9 hyperlipidemia  - Continue statin.  #10 4.5 cm ascending aortic aneurysm per CT scan of 08/01/2013  - Stable. Asymptomatic.  #11 type 2 diabetes  - continue Lantus and SSI  #12. Hypomagnesemia  - supplemented by TPN  Superficial Fungal Infection -Start Clotrimazole cream  Diet: NPO with ice chips  Access: TL PICC  IVF: TPN  Proph: apixaban   Code Status: full  Family Communication: patient alone  Disposition Plan: Patient is not a good candidate for SNF due to  complexity of care, including need for continuous TPN and continuous wall suction. These interventions can easily be managed by an LTACH. The patient is otherwise stable but no bed available at [facility] and pt refusing to go to [facility] in [place].    Antibiotics  Vancomycin 08/15/13>>> 08/31/13  Zosyn 08/14/13>>>08/31/13  Procedures/Studies:  Ct Abdomen Pelvis Wo Contrast 08/14/2013 14.5 x 18.1 x 26.1 cm fluid collection/abscess within the anterior abdominal wall, as described above, incompletely visualized. The collection appears just anterior to prior ventral hernia mesh. Given the size and proximity to the skin surface, incision and drainage is suggested.  Dg Chest 2 View 08/01/2013 No active cardiopulmonary disease.  Dg Hip Complete Right 08/11/2013 No acute osseous injury of the right hip.  Ct Angio Chest Pe W/cm &/or Wo Cm 08/01/2013 4.5 cm AAA without complicating features. No evidence of acute pulmonary embolism or aortic dissection.  Dg Chest Port 1 View 08/18/2013 Right upper extremity PICC terminates in the mid superior vena cava. Stable congestive heart failure pattern.  Dg Chest Port 1 View 08/17/2013 Stable chest with changes of congestive heart failure with pulmonary interstitial edema. Cardiac pacer in stable position.  Dg Chest Portable 1 View 08/15/2013 Stable cardiomegaly with interval worsening of pulmonary vascular congestion/ edema relative to 08/13/2013.  Dg Chest Port 1 View 08/13/2013 No acute findings. Stable cardiomegaly and pulmonary venous prominence without overt edema.  2-D echo 08/12/2013  Incision and drainage  of abscess, Gore-Tex mesh removal, debridement of abdominal wall and peritoneum Consultants:  Cardiology: Dr. Terrence Dupont 08/13/2013 - signed off  General surgery:  ID: Dr Megan Salon 08/15/13 -- signed off   HPI/Subjective: No events overnight.   Objective: Filed Vitals:   09/25/13 1404 09/25/13 2100 09/25/13 2358 09/26/13 0419  BP: 137/78 143/86  142/65  Pulse:  59 85 69 62  Temp: 98.4 F (36.9 C) 98.6 F (37 C)  97.4 F (36.3 C)  TempSrc: Oral Oral  Oral  Resp: 16 16 18 18   Height:      Weight:    150.6 kg (332 lb 0.2 oz)  SpO2: 100% 100% 99% 100%    Intake/Output Summary (Last 24 hours) at 09/26/13 1219 Last data filed at 09/26/13 1018  Gross per 24 hour  Intake   3875 ml  Output   1450 ml  Net   2425 ml    Exam:   General:  Pt is alert, follows commands appropriately, not in acute distress  Cardiovascular: Regular rate and rhythm, S1/S2, no murmurs, no rubs, no gallops  Respiratory: Clear to auscultation bilaterally, no wheezing, no crackles, no rhonchi  Abdomen: Soft, non tender, non distended, open abdominal wound with drainage   Extremities: No edema, pulses DP and PT palpable bilaterally  Neuro: Grossly nonfocal  Data Reviewed: Basic Metabolic Panel:  Recent Labs Lab 09/20/13 0400 09/22/13 0410 09/23/13 0525  NA 132* 134* 132*  K 3.8 3.9 3.6  CL 94* 98 95*  CO2 29 29 28   GLUCOSE 103* 116* 153*  BUN 24* 24* 24*  CREATININE 1.02 1.03 1.00  CALCIUM 8.9 9.0 8.8  MG 1.6  --  1.7  PHOS 4.9*  --  4.7*   Liver Function Tests:  Recent Labs Lab 09/20/13 0400 09/23/13 0525  AST 43* 45*  ALT 51 48  ALKPHOS 89 81  BILITOT 1.0 1.1  PROT 7.3 7.3  ALBUMIN 2.2* 2.2*   CBC:  Recent Labs Lab 09/20/13 0400 09/22/13 0410 09/23/13 1135  WBC 8.8 7.6 8.5  NEUTROABS 4.8  --   --   HGB 10.5* 10.3* 10.7*  HCT 30.3* 30.7* 32.3*  MCV 91.0 91.9 92.0  PLT 334 314 321   CBG:  Recent Labs Lab 09/25/13 1635 09/25/13 1928 09/25/13 2329 09/26/13 0416 09/26/13 0725  GLUCAP 154* 154* 144* 147* 144*    Scheduled Meds: . acetaminophen  1,000 mg Oral TID  . antiseptic oral rinse  15 mL Mouth Rinse q12n4p  . apixaban  5 mg Oral BID  . atorvastatin  40 mg Oral QODAY  . carvedilol  3.125 mg Oral BID WC  . clotrimazole   Topical BID  . ferrous sulfate  325 mg Oral TID  . furosemide  40 mg Oral Daily  .  hydrALAZINE  50 mg Oral Q8H  . insulin aspart  0-9 Units Subcutaneous Q4H  . insulin glargine  30 Units Subcutaneous QHS  . isosorbide mononitrate  15 mg Oral Daily  . lip balm  1 application Topical BID  . loperamide  4 mg Oral QID  . neomycin-bacitracin-polymyxin   Topical BID  . OxyCODONE  15 mg Oral Q12H  . potassium chloride  40 mEq Oral Daily  . sodium chloride  3 mL Intravenous Q12H  . zolpidem  10 mg Oral QHS   Continuous Infusions: . sodium chloride 20 mL/hr at 09/24/13 0406  . [name] KitchenTPN (CLINIMIX-E) Adult 125 mL/hr at 09/25/13 1802   And  . fat emulsion 250 mL (  09/25/13 1801)  . [name] KitchenTPN (CLINIMIX-E) Adult     And  . fat emulsion     Kelvin Cellar, MD  [place] Pager [phone]  If 7PM-7AM, please contact night-coverage [email or web address] Password [email or web address] 09/26/2013, 12:19 PM   LOS: 46 days

## 2013-09-27 LAB — COMPREHENSIVE METABOLIC PANEL
AST: 49 U/L — ABNORMAL HIGH (ref 0–37)
Albumin: 2.2 g/dL — ABNORMAL LOW (ref 3.5–5.2)
Alkaline Phosphatase: 76 U/L (ref 39–117)
BUN: 23 mg/dL (ref 6–23)
CO2: 26 mEq/L (ref 19–32)
Calcium: 8.8 mg/dL (ref 8.4–10.5)
Chloride: 98 mEq/L (ref 96–112)
Creatinine, Ser: 0.92 mg/dL (ref 0.50–1.35)
GFR calc non Af Amer: 90 mL/min — ABNORMAL LOW (ref >90)
Potassium: 3.5 mEq/L (ref 3.5–5.1)
Sodium: 134 mEq/L — ABNORMAL LOW (ref 135–145)
Total Bilirubin: 1.1 mg/dL (ref 0.3–1.2)
Total Protein: 7.1 g/dL (ref 6.0–8.3)

## 2013-09-27 LAB — CBC
HCT: 29 % — ABNORMAL LOW (ref 39.0–52.0)
Hemoglobin: 9.9 g/dL — ABNORMAL LOW (ref 13.0–17.0)
MCH: 31.4 pg (ref 26.0–34.0)
MCHC: 34.1 g/dL (ref 30.0–36.0)
RBC: 3.15 MIL/uL — ABNORMAL LOW (ref 4.22–5.81)
RDW: 14.3 % (ref 11.5–15.5)

## 2013-09-27 LAB — DIFFERENTIAL
Basophils Absolute: 0 10*3/uL (ref 0.0–0.1)
Basophils Relative: 0 % (ref 0–1)
Eosinophils Absolute: 0.2 10*3/uL (ref 0.0–0.7)
Eosinophils Relative: 3 % (ref 0–5)
Monocytes Absolute: 1 10*3/uL (ref 0.1–1.0)
Monocytes Relative: 12 % (ref 3–12)
Neutro Abs: 4.6 10*3/uL (ref 1.7–7.7)

## 2013-09-27 LAB — GLUCOSE, CAPILLARY
Glucose-Capillary: 153 mg/dL — ABNORMAL HIGH (ref 70–99)
Glucose-Capillary: 145 mg/dL — ABNORMAL HIGH (ref 70–99)
Glucose-Capillary: 146 mg/dL — ABNORMAL HIGH (ref 70–99)
Glucose-Capillary: 138 mg/dL — ABNORMAL HIGH (ref 70–99)

## 2013-09-27 LAB — TRIGLYCERIDES: Triglycerides: 87 mg/dL (ref <150)

## 2013-09-27 LAB — PREALBUMIN: Prealbumin: 15.4 mg/dL — ABNORMAL LOW (ref 17.0–34.0)

## 2013-09-27 LAB — PHOSPHORUS: Phosphorus: 4.9 mg/dL — ABNORMAL HIGH (ref 2.3–4.6)

## 2013-09-27 MED ORDER — FAT EMULSION 20 % IV EMUL
250.0000 mL | INTRAVENOUS | Status: AC
  Administered 2013-09-27: 250 mL via INTRAVENOUS
  Filled 2013-09-27: qty 250

## 2013-09-27 MED ORDER — TRACE MINERALS CR-CU-F-FE-I-MN-MO-SE-ZN IV SOLN
INTRAVENOUS | Status: AC
  Administered 2013-09-27: 18:00:00 via INTRAVENOUS
  Filled 2013-09-27: qty 3000

## 2013-09-27 NOTE — Progress Notes (Signed)
Physical Therapy Treatment Patient Details Name: Kurt Cross MRN: [id number] DOB: [birth year] Today's Date: 09/27/2013 Time: [id number] PT Time Calculation (min): 14 min  PT Assessment / Plan / Recommendation  History of Present Illness Kurt Cross is a 62 y.o. male  with known history of chronic systolic heart failure status post defibrillator placement last EF measured was in 2013 was 35-40%, on home oxygen, OSA noncompliant with CPAP, diabetes mellitus, hypertension and hyperlipidemia presented to the ER because right leg pain after sustaining a fall the night prior to admission.  08/14/13: s/p abdominal abscess removal   PT Comments   Pt OOB in recliner amb in hallway.  Pt progressing well and more motivated to "get out of here".  Follow Up Recommendations  SNF     Does the patient have the potential to tolerate intense rehabilitation     Barriers to Discharge        Equipment Recommendations       Recommendations for Other Services    Frequency Min 2X/week   Progress towards PT Goals Progress towards PT goals: Progressing toward goals  Plan      Precautions / Restrictions Precautions Precautions: Fall Precaution Comments: ostomy bag on R side and wall suction to ABD Restrictions Weight Bearing Restrictions: No    Pertinent Vitals/Pain No c/o pain    Mobility  Bed Mobility Bed Mobility: Not assessed Details for Bed Mobility Assistance: Pt OOB in recliner Transfers Transfers: Sit to Stand;Stand to Sit Sit to Stand: 5: Supervision;From chair/3-in-1 Stand to Sit: 4: Min guard;5: Supervision;To chair/3-in-1 Details for Transfer Assistance: Pt does better to shift weight forward then rise but allows self to "plop" in recliner uncontrolled sit Ambulation/Gait Ambulation/Gait Assistance: 5: Supervision;4: Min guard Ambulation Distance (Feet): 125 Feet Assistive device: Rolling walker Ambulation/Gait Assistance Details: increased amb distance this session.  Gait  Pattern: Step-through pattern;Wide base of support;Decreased stride length;Ataxic Gait velocity: decreased     PT Goals (current goals can now be found in the care plan section)    Visit Information  Last PT Received On: 09/27/13 Assistance Needed: +1 History of Present Illness: Kurt Cross is a 62 y.o. male  with known history of chronic systolic heart failure status post defibrillator placement last EF measured was in 2013 was 35-40%, on home oxygen, OSA noncompliant with CPAP, diabetes mellitus, hypertension and hyperlipidemia presented to the ER because right leg pain after sustaining a fall the night prior to admission.  08/14/13: s/p abdominal abscess removal    Subjective Data      Cognition       Balance     End of Session PT - End of Session Activity Tolerance: Patient tolerated treatment well   Rica Koyanagi  PTA [facility]  Acute  Rehab Pager      [phone]

## 2013-09-27 NOTE — Progress Notes (Signed)
Placed patient on CPAP via [facility]FM, auto titrate settings (min 5.0 max 20.0) with 2 lpm O2 bleed in.  Patient tolerating well at this time.

## 2013-09-27 NOTE — Progress Notes (Signed)
PARENTERAL NUTRITION CONSULT NOTE - FOLLOW UP  Pharmacy Consult for TNA Indication: Enterocutaneous fistula  No Known Allergies  Patient Measurements: Height: 6' (182.9 cm) Weight: 336 lb 3.2 oz (152.5 kg) IBW/kg (Calculated) : 77.6 Adjusted Body Weight: 102.6kg  Vital Signs: Temp: 97.7 F (36.5 C) (12/22 0438) Temp src: Oral (12/22 0438) BP: 153/76 mmHg (12/22 0438) Pulse Rate: 70 (12/22 0438) Intake/Output from previous day: 12/21 0701 - 12/22 0700 In: 3240 [TPN:3240] Out: 3090 [Urine:2200; Drains:890]  Labs:  Recent Labs  09/27/13 0602  WBC 8.5  HGB 9.9*  HCT 29.0*  PLT 292    Recent Labs  09/27/13 0601 09/27/13 0602  NA  --  134*  K  --  3.5  CL  --  98  CO2  --  26  GLUCOSE  --  130*  BUN  --  23  CREATININE  --  0.92  CALCIUM  --  8.8  MG  --  1.7  PHOS  --  4.9*  PROT  --  7.1  ALBUMIN  --  2.2*  AST  --  49*  ALT  --  57*  ALKPHOS  --  76  BILITOT  --  1.1  TRIG 87  --     Recent Labs  09/27/13 0008 09/27/13 0436 09/27/13 0725  GLUCAP 132* 145* 134*   Insulin Requirements in the past 24 hours:   Lantus 30 units qhs.  Note Lantus requirements have fluctuated d/t occasional hypoglycemic events (Last on 12/14).  Required 9 units sensitive SSI (CBG q4h)  TNA contains regular insulin 45 units per 3L bag (15 units/L, increased on 12/20)  Nutritional Goals:   Re-estimated per RD 12/16:  [id number] kcal/day, 160-180 grams/day protein, 3.3-3.5 L/day fluid  Clinimix E5/15 at goal rate 125 ml/hr (3L/day) with 20% lipids at 10 ml/hr to provide 150g protein and 2675 kcal daily.  Due to how much fluid can be placed in TNA bag, maximum volume is 3L.  This rate will meet >/= 90% of estimated needs.  Current Nutrition:   NPO except for ice chips, sips with meds  Clinimix E5/15 at 133ml/hr and 20% lipids at 10 ml/hr over 24 hours  IVF: NS at [place]: 62 yo male with large abdominal abscess with infected old Gore Tex mesh s/p I&D  abscess, removal of mesh, debridement of abdominal wall and peritoneum. 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 TNA started 11/12 at 10pm.  Patient currently unable to be discharged to a facility nearby that can accommodate both TNA and drainage system.   12/22:  Pt continues 24 hour continuous TPN.  Per surgery, fistula output thickening (sandostatin d/c, imodium and iron increased, lomotil added) and will require close monitoring for possible clogging; wound decreasing in size.  Remains on bowel rest and continuous wall suction.  Unable to transfer to SNF (insurance denied local LTAC).  CCS to wait until 6 months before attempting small bowel resection of fistula and recommend management at an experienced major academic center.  On 12/21, CCS requested re-trying cyclic TNA to help discharge placement planning.   Discussed with social worker who relayed that cyclic vs continuous TNA will not make a difference for placement, unfortunately.   Pt had initial trial of cyclic TNA in early December with hypoglycemia events during 6 hours off TNA.  Family very upset and against further cyclic trials.  Since then, lantus dose has decreased by ~50%.     Pt has CHF, last EF 50-55%  08/2013.  In patients with decreased fluid requirements (renal insufficiency, CHF, cirrhotic ascites, pulmonary disease), usually consider the max hourly fluid rate (TPN + MIVF) of [id number] for cyclic TPN. Pt currently receiving 135 ml/hr (TNA + lipids).   Recommendations:  There is no advantage to cyclic TPN in regards to discharge planning but has increased risks of fluid overload and hypoglycemia.  Cyclic TNA would increase rate to ~200 ml/hr.    Plan to continue 24 hr continuous TPN for now.  Labs:   Glucose: Hx DM on insulin PTA. CBGs at goal overnight after increase in insulin in TNA, hypoglycemia resolved with decreased lantus and regular insulin in TPN 12/14.  Electrolytes: Na 134 (unable to adjust in TNA), Phos  4.9, Corr Cal 10.24 (Ca xPhos product = 50.2)  Renal:  SCr stable/wnl.  LFTs: AST/ALT slightly elevated (49/57).  Albumin 2.2  TGs: 87 (12/22)  Prealbumin: 10.3 (11/13), 11.1 (11/17), 11.2(11/24), 13.6 (12/1), 10.3 (12/8), 13.4 (12/15), pending (12/22)  TPN Access: Triple lumen PICC placed 11/12 TPN day#: 40  Plan:   Continue Clinimix E 5/15 at 125 ml/hr with lipids 20% at 10 ml/hr over 24 hours.     Continue with insulin 45 units per 3L bag (15 units/L)  TNA to contain standard multivitamins and trace elements.  Continue CBG checks q4h with sensitive SSI coverage.     Continue Lantus 30 units QHS.  KCl 49mEq PO daily per MD  Continue NS at [facility] on Mondays & Thursdays.   F/u discharge plans and TNA arrangement.   Gretta Arab PharmD, BCPS Pager [phone] 09/27/2013 10:16 AM

## 2013-09-27 NOTE — Progress Notes (Signed)
Patient ID: [name], male   DOB: 1950/10/24, 62 y.o.   MRN: [id number] 44 Days Post-Op  Subjective: Pt asleep with CPAP in place  Objective: Vital signs in last 24 hours: Temp:  [97.5 F (36.4 C)-98.4 F (36.9 C)] 97.7 F (36.5 C) (12/22 0438) Pulse Rate:  [59-70] 70 (12/22 0438) Resp:  [18] 18 (12/22 0438) BP: (123-153)/(60-76) 153/76 mmHg (12/22 0438) SpO2:  [94 %-100 %] 98 % (12/22 0438) Weight:  [336 lb 3.2 oz (152.5 kg)] 336 lb 3.2 oz (152.5 kg) (12/22 0438) Last BM Date: 09/21/13  Intake/Output from previous day: 12/21 0701 - 12/22 0700 In: 3240 [TPN:3240] Out: 3090 [Urine:2200; Drains:890] Intake/Output this shift:    PE: Abd: stable, Eakin's pouch in place.  enteric contents draining  Lab Results:   Recent Labs  09/27/13 0602  WBC 8.5  HGB 9.9*  HCT 29.0*  PLT 292   BMET  Recent Labs  09/27/13 0602  NA 134*  K 3.5  CL 98  CO2 26  GLUCOSE 130*  BUN 23  CREATININE 0.92  CALCIUM 8.8   PT/INR No results found for this basename: LABPROT, INR,  in the last 72 hours CMP     Component Value Date/Time   NA 134* 09/27/2013 0602   K 3.5 09/27/2013 0602   CL 98 09/27/2013 0602   CO2 26 09/27/2013 0602   GLUCOSE 130* 09/27/2013 0602   BUN 23 09/27/2013 0602   CREATININE 0.92 09/27/2013 0602   CALCIUM 8.8 09/27/2013 0602   PROT 7.1 09/27/2013 0602   ALBUMIN 2.2* 09/27/2013 0602   AST 49* 09/27/2013 0602   ALT 57* 09/27/2013 0602   ALKPHOS 76 09/27/2013 0602   BILITOT 1.1 09/27/2013 0602   GFRNONAA 90* 09/27/2013 0602   GFRAA >90 09/27/2013 0602   Lipase  No results found for this basename: lipase       Studies/Results: No results found.  Anti-infectives: Anti-infectives   Start     Dose/Rate Route Frequency Ordered Stop   08/31/13 0800  vancomycin (VANCOCIN) 1,750 mg in sodium chloride 0.9 % 500 mL IVPB  Status:  Discontinued     1,750 mg 250 mL/hr over 120 Minutes Intravenous Every 24 hours 08/31/13 0753 08/31/13 1417   08/16/13  0800  vancomycin (VANCOCIN) 2,000 mg in sodium chloride 0.9 % 500 mL IVPB  Status:  Discontinued     2,000 mg 250 mL/hr over 120 Minutes Intravenous Every 24 hours 08/15/13 1411 08/31/13 0753   08/15/13 1500  vancomycin (VANCOCIN) 2,000 mg in sodium chloride 0.9 % 500 mL IVPB     2,000 mg 250 mL/hr over 120 Minutes Intravenous  Once 08/15/13 1411 08/15/13 1713   08/14/13 1600  piperacillin-tazobactam (ZOSYN) IVPB 3.375 g  Status:  Discontinued     3.375 g 12.5 mL/hr over 240 Minutes Intravenous Every 8 hours 08/14/13 1543 08/31/13 1417       Assessment/Plan   POD 44 Infected prosthetic mesh of abdominal wall with GIANT abscess s/p removal 08/14/2013 Dr. 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  Diabetes mellitus, insulin dependent (IDDM), uncontrolled  HYPERLIPIDEMIA  Obesity, Class III, BMI 40-49.9 (morbid obesity)  OBSTRUCTIVE SLEEP APNEA  Acute renal insufficiency from fistula/wound loss-improved  Deconditioning  Plan: 1. Patient maxed out on imodium and iron supplements.  sandostatin stopped.   2. Cont tNA for nutritional support and cont current care.  LOS: 47 days    OSBORNE,KELLY E 09/27/2013, 9:10 AM Pager: [phone]  In good spirits today. Photo of wound in 09/23/2013 note.  No acute issue.  Alphonsa Overall, MD, [facility] Surgery Pager: [phone] Office phone:  [phone]

## 2013-09-27 NOTE — Progress Notes (Signed)
CSW continues to follow. Patient still has no snf that can appropriately manage his needs.  Lakrisha Iseman C. [employer] MSW, [place]

## 2013-09-27 NOTE — Progress Notes (Signed)
Patient ID: Kurt Cross, male   DOB: 05-Oct-1951, 62 y.o.   MRN: [id number] [facility] PROGRESS NOTE  Kurt Cross [id number] DOB: 12-05-1950 DOA: 08/11/2013 PCP: [name], MD  He appears to be getting stronger, ambulating with his walker down to the hallway.   Assessment/Plan  Infected prosthetic gore-tex mesh  - with large abscess s/p I&D and removal of gore-tex on 11/8. 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  - Continue TNA   - Continuous wall suction - pt will remain in the hospital for now as unable to go to LTAC  Sepsis  - due to coag neg staph s/p 2 weeks of vancomycin, resolved HTN  - blood pressure stable  - cont coreg and lasix, hydralazine, imdur  #4 atrial fibrillation/atrial flutter  - secondary to problem #1. Now ressolved, CE x 3 neg, 2 D ECHO EF 55%  - currently rate controlled on Coreg  - CHADSVASc score is 4 (not ASVD plaque on abd aorta), resumed eliquis  #5 chronic kidney disease stage III  - stable, Cr WNL  #6 leukocytosis/fever  - resolved.  #7 chronic combined systolic and diastolic CHF  - weight stable around 150 kg or 330lbs  - clinically compensated  OSA,  - stable. Continue CPAP.  #8 Uncontrolled Pain  - continue prn oxycodone as needed  #9 hyperlipidemia  - Continue statin.  #10 4.5 cm ascending aortic aneurysm per CT scan of 08/01/2013  - Stable. Asymptomatic.  #11 type 2 diabetes  - continue Lantus and SSI  #12. Hypomagnesemia  - supplemented by TPN  Superficial Fungal Infection -Start Clotrimazole cream  Diet: NPO with ice chips  Access: TL PICC  IVF: TPN  Proph: apixaban   Code Status: full  Family Communication: patient alone  Disposition Plan: Patient is not a good candidate for SNF due to complexity of care, including need for continuous TPN and continuous wall suction. These interventions can easily be managed by an LTACH. The patient is otherwise stable but no bed  available at [facility] and pt refusing to go to [facility] in [place].    Antibiotics  Vancomycin 08/15/13>>> 08/31/13  Zosyn 08/14/13>>>08/31/13  Procedures/Studies:  Ct Abdomen Pelvis Wo Contrast 08/14/2013 14.5 x 18.1 x 26.1 cm fluid collection/abscess within the anterior abdominal wall, as described above, incompletely visualized. The collection appears just anterior to prior ventral hernia mesh. Given the size and proximity to the skin surface, incision and drainage is suggested.  Dg Chest 2 View 08/01/2013 No active cardiopulmonary disease.  Dg Hip Complete Right 08/11/2013 No acute osseous injury of the right hip.  Ct Angio Chest Pe W/cm &/or Wo Cm 08/01/2013 4.5 cm AAA without complicating features. No evidence of acute pulmonary embolism or aortic dissection.  Dg Chest Port 1 View 08/18/2013 Right upper extremity PICC terminates in the mid superior vena cava. Stable congestive heart failure pattern.  Dg Chest Port 1 View 08/17/2013 Stable chest with changes of congestive heart failure with pulmonary interstitial edema. Cardiac pacer in stable position.  Dg Chest Portable 1 View 08/15/2013 Stable cardiomegaly with interval worsening of pulmonary vascular congestion/ edema relative to 08/13/2013.  Dg Chest Port 1 View 08/13/2013 No acute findings. Stable cardiomegaly and pulmonary venous prominence without overt edema.  2-D echo 08/12/2013  Incision and drainage of abscess, Gore-Tex mesh removal, debridement of abdominal wall and peritoneum Consultants:  Cardiology: Dr. Terrence Dupont 08/13/2013 - signed off  General surgery:  ID: Dr Megan Salon 08/15/13 -- signed off  HPI/Subjective: No events overnight.   Objective: Filed Vitals:   09/27/13 0100 09/27/13 0438 09/27/13 1326 09/27/13 1355  BP:  153/76 114/57 132/58  Pulse: 64 70  70  Temp:  97.7 F (36.5 C)  97.6 F (36.4 C)  TempSrc:  Oral  Oral  Resp: 18 18  16   Height:      Weight:  152.5 kg (336 lb 3.2 oz)    SpO2: 94% 98%  100%     Intake/Output Summary (Last 24 hours) at 09/27/13 1524 Last data filed at 09/27/13 0700  Gross per 24 hour  Intake   3240 ml  Output   2090 ml  Net   1150 ml    Exam:   General:  Pt is alert, follows commands appropriately, not in acute distress  Cardiovascular: Regular rate and rhythm, S1/S2, no murmurs, no rubs, no gallops  Respiratory: Clear to auscultation bilaterally, no wheezing, no crackles, no rhonchi  Abdomen: Soft, non tender, non distended, open abdominal wound with drainage   Extremities: No edema, pulses DP and PT palpable bilaterally  Neuro: Grossly nonfocal  Data Reviewed: Basic Metabolic Panel:  Recent Labs Lab 09/22/13 0410 09/23/13 0525 09/27/13 0602  NA 134* 132* 134*  K 3.9 3.6 3.5  CL 98 95* 98  CO2 29 28 26   GLUCOSE 116* 153* 130*  BUN 24* 24* 23  CREATININE 1.03 1.00 0.92  CALCIUM 9.0 8.8 8.8  MG  --  1.7 1.7  PHOS  --  4.7* 4.9*   Liver Function Tests:  Recent Labs Lab 09/23/13 0525 09/27/13 0602  AST 45* 49*  ALT 48 57*  ALKPHOS 81 76  BILITOT 1.1 1.1  PROT 7.3 7.1  ALBUMIN 2.2* 2.2*   CBC:  Recent Labs Lab 09/22/13 0410 09/23/13 1135 09/27/13 0602  WBC 7.6 8.5 8.5  NEUTROABS  --   --  4.6  HGB 10.3* 10.7* 9.9*  HCT 30.7* 32.3* 29.0*  MCV 91.9 92.0 92.1  PLT 314 321 292   CBG:  Recent Labs Lab 09/26/13 2103 09/27/13 0008 09/27/13 0436 09/27/13 0725 09/27/13 1229  GLUCAP 153* 132* 145* 134* 146*    Scheduled Meds: . acetaminophen  1,000 mg Oral TID  . antiseptic oral rinse  15 mL Mouth Rinse q12n4p  . apixaban  5 mg Oral BID  . atorvastatin  40 mg Oral QODAY  . carvedilol  3.125 mg Oral BID WC  . clotrimazole   Topical BID  . ferrous sulfate  325 mg Oral TID  . furosemide  40 mg Oral Daily  . hydrALAZINE  50 mg Oral Q8H  . insulin aspart  0-9 Units Subcutaneous Q4H  . insulin glargine  30 Units Subcutaneous QHS  . isosorbide mononitrate  15 mg Oral Daily  . lip balm  1 application Topical BID   . loperamide  4 mg Oral QID  . neomycin-bacitracin-polymyxin   Topical BID  . OxyCODONE  15 mg Oral Q12H  . potassium chloride  40 mEq Oral Daily  . sodium chloride  3 mL Intravenous Q12H  . zolpidem  10 mg Oral QHS   Continuous Infusions: . sodium chloride 20 mL/hr at 09/24/13 0406  . [name] KitchenTPN (CLINIMIX-E) Adult 125 mL/hr at 09/26/13 1807   And  . fat emulsion 250 mL (09/26/13 1806)  . [name] KitchenTPN (CLINIMIX-E) Adult     And  . fat emulsion     Kelvin Cellar, MD  [place] Pager [phone]  If 7PM-7AM, please contact night-coverage [email or web address] Password [email or web address]  09/27/2013, 3:24 PM   LOS: 47 days

## 2013-09-28 ENCOUNTER — Inpatient Hospital Stay (HOSPITAL_COMMUNITY): Payer: Medicare Other

## 2013-09-28 DIAGNOSIS — N390 Urinary tract infection, site not specified: Secondary | ICD-10-CM

## 2013-09-28 LAB — URINALYSIS, ROUTINE W REFLEX MICROSCOPIC
Bilirubin Urine: NEGATIVE
Glucose, UA: NEGATIVE mg/dL
Hgb urine dipstick: NEGATIVE
Ketones, ur: NEGATIVE mg/dL
Nitrite: POSITIVE — AB
Specific Gravity, Urine: 1.012 (ref 1.005–1.030)
Urobilinogen, UA: 0.2 mg/dL (ref 0.0–1.0)
pH: 5.5 (ref 5.0–8.0)

## 2013-09-28 LAB — GLUCOSE, CAPILLARY
Glucose-Capillary: 137 mg/dL — ABNORMAL HIGH (ref 70–99)
Glucose-Capillary: 139 mg/dL — ABNORMAL HIGH (ref 70–99)
Glucose-Capillary: 151 mg/dL — ABNORMAL HIGH (ref 70–99)
Glucose-Capillary: 157 mg/dL — ABNORMAL HIGH (ref 70–99)
Glucose-Capillary: 175 mg/dL — ABNORMAL HIGH (ref 70–99)
Glucose-Capillary: 86 mg/dL (ref 70–99)

## 2013-09-28 LAB — URINE MICROSCOPIC-ADD ON

## 2013-09-28 LAB — BASIC METABOLIC PANEL
BUN: 26 mg/dL — ABNORMAL HIGH (ref 6–23)
Calcium: 8.8 mg/dL (ref 8.4–10.5)
Chloride: 99 mEq/L (ref 96–112)
Creatinine, Ser: 1 mg/dL (ref 0.50–1.35)
GFR calc Af Amer: >90 mL/min (ref >90)
GFR calc non Af Amer: 80 mL/min — ABNORMAL LOW (ref >90)
Potassium: 4.1 mEq/L (ref 3.5–5.1)
Sodium: 133 mEq/L — ABNORMAL LOW (ref 135–145)

## 2013-09-28 LAB — LACTIC ACID, PLASMA: Lactic Acid, Venous: 0.8 mmol/L (ref 0.5–2.2)

## 2013-09-28 MED ORDER — FAT EMULSION 20 % IV EMUL
250.0000 mL | INTRAVENOUS | Status: AC
  Administered 2013-09-28: 250 mL via INTRAVENOUS
  Filled 2013-09-28: qty 250

## 2013-09-28 MED ORDER — ALTEPLASE 2 MG IJ SOLR
6.0000 mg | Freq: Once | INTRAMUSCULAR | Status: AC
  Administered 2013-09-28: 2 mg
  Filled 2013-09-28: qty 6

## 2013-09-28 MED ORDER — ALTEPLASE 2 MG IJ SOLR
2.0000 mg | Freq: Once | INTRAMUSCULAR | Status: AC
  Administered 2013-09-28: 2 mg
  Filled 2013-09-28: qty 2

## 2013-09-28 MED ORDER — VANCOMYCIN HCL 10 G IV SOLR
2500.0000 mg | Freq: Once | INTRAVENOUS | Status: AC
  Administered 2013-09-28: 12:00:00 2500 mg via INTRAVENOUS
  Filled 2013-09-28: qty 2500

## 2013-09-28 MED ORDER — VANCOMYCIN HCL 10 G IV SOLR
1500.0000 mg | Freq: Two times a day (BID) | INTRAVENOUS | Status: DC
  Administered 2013-09-29 – 2013-10-02 (×8): 1500 mg via INTRAVENOUS
  Filled 2013-09-28 (×10): qty 1500

## 2013-09-28 MED ORDER — M.V.I. ADULT IV INJ
INTRAVENOUS | Status: AC
  Administered 2013-09-28: 18:00:00 via INTRAVENOUS
  Filled 2013-09-28: qty 3000

## 2013-09-28 MED ORDER — PIPERACILLIN-TAZOBACTAM 3.375 G IVPB
3.3750 g | Freq: Three times a day (TID) | INTRAVENOUS | Status: DC
  Administered 2013-09-28 – 2013-10-02 (×12): 3.375 g via INTRAVENOUS
  Filled 2013-09-28 (×15): qty 50

## 2013-09-28 NOTE — Progress Notes (Signed)
PARENTERAL NUTRITION CONSULT NOTE - FOLLOW UP  Pharmacy Consult for TNA Indication: Enterocutaneous fistula  No Known Allergies  Patient Measurements: Height: 6' (182.9 cm) Weight: 344 lb 2.2 oz (156.1 kg) IBW/kg (Calculated) : 77.6 Adjusted Body Weight: 102.6kg  Vital Signs: Temp: 101.8 F (38.8 C) (12/23 0820) Temp src: Oral (12/23 0820) BP: 165/72 mmHg (12/23 0411) Pulse Rate: 86 (12/23 0411) Intake/Output from previous day: 12/22 0701 - 12/23 0700 In: 4200 [I.V.:960; TPN:3240] Out: 825 [Urine:825]  Labs:  Recent Labs  09/27/13 0602  WBC 8.5  HGB 9.9*  HCT 29.0*  PLT 292    Recent Labs  09/27/13 0601 09/27/13 0602 09/28/13 0705  NA  --  134* 133*  K  --  3.5 4.1  CL  --  98 99  CO2  --  26 24  GLUCOSE  --  130* 133*  BUN  --  23 26*  CREATININE  --  0.92 1.00  CALCIUM  --  8.8 8.8  MG  --  1.7  --   PHOS  --  4.9* 4.5  PROT  --  7.1  --   ALBUMIN  --  2.2*  --   AST  --  49*  --   ALT  --  57*  --   ALKPHOS  --  76  --   BILITOT  --  1.1  --   PREALBUMIN  --  15.4*  --   TRIG 87  --   --     Recent Labs  09/28/13 0016 09/28/13 0408 09/28/13 0739  GLUCAP 151* 137* 139*   Insulin Requirements in the past 24 hours:   Lantus 30 units qhs.  Note Lantus requirements have fluctuated d/t occasional hypoglycemic events (Last on 12/14).  Required 6 units sensitive SSI (CBG q4h)  TNA contains regular insulin 45 units per 3L bag (15 units/L, increased on 12/20)  Nutritional Goals:   Re-estimated per RD 12/16:  [id number] kcal/day, 160-180 grams/day protein, 3.3-3.5 L/day fluid.  Clinimix E5/15 at goal rate 125 ml/hr (3L/day) with 20% lipids at 10 ml/hr to provide 150g protein and 2675 kcal daily.  Due to how much fluid can be placed in TNA bag, maximum volume is 3L.  This rate will meet >/= 90% of estimated needs.  Current Nutrition:   NPO except for ice chips, sips with meds  Clinimix E5/15 at 171ml/hr and 20% lipids at 10 ml/hr over 24  hours  IVF: NS at [place]: 62 yo male with large abdominal abscess with infected old Gore Tex mesh s/p I&D abscess, removal of mesh, debridement of abdominal wall and peritoneum. 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 TNA started 11/12 at 10pm.  Patient currently unable to be discharged to a facility nearby that can accommodate both TNA and drainage system.   12/23:  Pt continues 24 hour continuous TPN.  Per surgery, sandostatin d/c, imodium and iron increased, and lomotil added (monitoring for possible clogging.)  Wound size is improving, but remains on bowel rest, continuous wall suction, and unable to transfer to SNF. (insurance denied local LTAC).  CCS to wait until 6 months before attempting small bowel resection of fistula and recommend management at an experienced major academic center.  On 12/21, CCS requested re-trying cyclic TNA, but because there is no advantage for discharge planning and pt has increased risks of fluid overload and hypoglycemia, we will continue with 24 hr continuous TPN.  Today the triple lumen PICC is infusing correctly,  but cannot get blood return.  IV team to instill alteplase for 1 hr (at ~ 0900) with close monitoring of hypoglycemic symptoms (RN and pt are aware).  Pre-albumin is improving.  Labs:   Glucose: Hx DM on insulin PTA. CBGs at goal overnight after increase in insulin in TNA, hypoglycemia resolved with decreased lantus and regular insulin in TPN 12/14.  Electrolytes: Na 133 (unable to adjust in TNA), Phos 4.95 Corr Cal 10.24 (Ca xPhos product = 46)  Renal:  SCr stable/wnl.  LFTs: AST/ALT slightly elevated (49/57), Albumin 2.2 (12/22)  TGs: 87 (12/22)  Prealbumin: 10.3 (11/13), 11.1 (11/17), 11.2(11/24), 13.6 (12/1), 10.3 (12/8), 13.4 (12/15), 15.4 (12/22)  TPN Access: Triple lumen PICC placed 11/12 TPN day#: 41  Plan:   Continue Clinimix E 5/15 at 125 ml/hr with lipids 20% at 10 ml/hr over 24 hours.     Continue with  insulin 45 units per 3L bag (15 units/L)  TNA to contain standard multivitamins and trace elements.  Continue CBG checks q4h with sensitive SSI coverage.     Continue Lantus 30 units QHS.  KCl 81mEq PO daily per MD  Continue NS at [facility] on Mondays & Thursdays.   F/u discharge plans and TNA arrangement.   Gretta Arab PharmD, BCPS Pager [phone] 09/28/2013 8:33 AM

## 2013-09-28 NOTE — Progress Notes (Signed)
Physical Therapy Treatment Patient Details Name: CORDE MOURER MRN: [id number] DOB: 1951-01-22 Today's Date: 09/28/2013 Time: [id number] PT Time Calculation (min): 12 min  PT Assessment / Plan / Recommendation  History of Present Illness MEKIAH CRANMER is a 63 y.o. male  with known history of chronic systolic heart failure status post defibrillator placement last EF measured was in 2013 was 35-40%, on home oxygen, OSA noncompliant with CPAP, diabetes mellitus, hypertension and hyperlipidemia presented to the ER because right leg pain after sustaining a fall the night prior to admission.  08/14/13: s/p abdominal abscess removal   PT Comments   Pt OOB in recliner.  Assisted with amb.  Pt upset because he just got back from [place] and was told now he has PNA.   Follow Up Recommendations  SNF     Does the patient have the potential to tolerate intense rehabilitation     Barriers to Discharge        Equipment Recommendations       Recommendations for Other Services    Frequency Min 2X/week   Progress towards PT Goals Progress towards PT goals: Progressing toward goals  Plan      Precautions / Restrictions Precautions Precautions: Fall Precaution Comments: ostomy bag on R side and wall suction to ABD Restrictions Weight Bearing Restrictions: No    Pertinent Vitals/Pain No c/o pain    Mobility  Bed Mobility Bed Mobility: Not assessed Details for Bed Mobility Assistance: Pt OOB in recliner Transfers Transfers: Sit to Stand;Stand to Sit Sit to Stand: 5: Supervision;From chair/3-in-1 Stand to Sit: 5: Supervision;To chair/3-in-1 Details for Transfer Assistance: Pt does better to shift weight forward then rise but allows self to "plop" in recliner uncontrolled sit Ambulation/Gait Ambulation/Gait Assistance: 5: Supervision Ambulation Distance (Feet): 130 Feet Assistive device: Rolling walker Ambulation/Gait Assistance Details: Increased time and wide BOS Gait Pattern: Step-through  pattern;Wide base of support;Decreased stride length;Ataxic Gait velocity: decreased General Gait Details: RA 94%     PT Goals (current goals can now be found in the care plan section)    Visit Information  Last PT Received On: 09/28/13 Assistance Needed: +1 History of Present Illness: DARKO YECK is a 62 y.o. male  with known history of chronic systolic heart failure status post defibrillator placement last EF measured was in 2013 was 35-40%, on home oxygen, OSA noncompliant with CPAP, diabetes mellitus, hypertension and hyperlipidemia presented to the ER because right leg pain after sustaining a fall the night prior to admission.  08/14/13: s/p abdominal abscess removal    Subjective Data      Cognition       Balance     End of Session PT - End of Session Equipment Utilized During Treatment: Gait belt Activity Tolerance: Patient tolerated treatment well Patient left: in chair;with call bell/phone within reach;with family/visitor present   Rica Koyanagi  PTA [facility]  Acute  Rehab Pager      [phone]

## 2013-09-28 NOTE — Progress Notes (Signed)
Patient ID: Kurt Cross, male   DOB: 1951-08-24, 62 y.o.   MRN: [id number] 45 Days Post-Op  Subjective: Pt c/o chills and sweats over night.  C/o cough with green sputum.  Denies any urinary symptoms.  No abdominal issues  Objective: Vital signs in last 24 hours: Temp:  [97.6 F (36.4 C)-101.8 F (38.8 C)] 99.5 F (37.5 C) (12/23 0932) Pulse Rate:  [70-86] 86 (12/23 0411) Resp:  [16-18] 16 (12/23 0411) BP: (114-165)/(57-72) 165/72 mmHg (12/23 0411) SpO2:  [94 %-100 %] 94 % (12/23 0411) Weight:  [344 lb 2.2 oz (156.1 kg)] 344 lb 2.2 oz (156.1 kg) (12/23 0601) Last BM Date: 09/21/13  Intake/Output from previous day: 12/22 0701 - 12/23 0700 In: 4200 [I.V.:960; TPN:3240] Out: 825 [Urine:825] Intake/Output this shift:    PE: Abd: soft, stable, adjusted red rubber slightly to aid in suction. Lungs: Seems CTAB with anterior auscultation  Lab Results:   Recent Labs  09/27/13 0602  WBC 8.5  HGB 9.9*  HCT 29.0*  PLT 292   BMET  Recent Labs  09/27/13 0602 09/28/13 0705  NA 134* 133*  K 3.5 4.1  CL 98 99  CO2 26 24  GLUCOSE 130* 133*  BUN 23 26*  CREATININE 0.92 1.00  CALCIUM 8.8 8.8   PT/INR No results found for this basename: LABPROT, INR,  in the last 72 hours CMP     Component Value Date/Time   NA 133* 09/28/2013 0705   K 4.1 09/28/2013 0705   CL 99 09/28/2013 0705   CO2 24 09/28/2013 0705   GLUCOSE 133* 09/28/2013 0705   BUN 26* 09/28/2013 0705   CREATININE 1.00 09/28/2013 0705   CALCIUM 8.8 09/28/2013 0705   PROT 7.1 09/27/2013 0602   ALBUMIN 2.2* 09/27/2013 0602   AST 49* 09/27/2013 0602   ALT 57* 09/27/2013 0602   ALKPHOS 76 09/27/2013 0602   BILITOT 1.1 09/27/2013 0602   GFRNONAA 80* 09/28/2013 0705   GFRAA >90 09/28/2013 0705   Lipase  No results found for this basename: lipase       Studies/Results: No results found.  Anti-infectives: Anti-infectives   Start     Dose/Rate Route Frequency Ordered Stop   08/31/13 0800  vancomycin  (VANCOCIN) 1,750 mg in sodium chloride 0.9 % 500 mL IVPB  Status:  Discontinued     1,750 mg 250 mL/hr over 120 Minutes Intravenous Every 24 hours 08/31/13 0753 08/31/13 1417   08/16/13 0800  vancomycin (VANCOCIN) 2,000 mg in sodium chloride 0.9 % 500 mL IVPB  Status:  Discontinued     2,000 mg 250 mL/hr over 120 Minutes Intravenous Every 24 hours 08/15/13 1411 08/31/13 0753   08/15/13 1500  vancomycin (VANCOCIN) 2,000 mg in sodium chloride 0.9 % 500 mL IVPB     2,000 mg 250 mL/hr over 120 Minutes Intravenous  Once 08/15/13 1411 08/15/13 1713   08/14/13 1600  piperacillin-tazobactam (ZOSYN) IVPB 3.375 g  Status:  Discontinued     3.375 g 12.5 mL/hr over 240 Minutes Intravenous Every 8 hours 08/14/13 1543 08/31/13 1417       Assessment/Plan   POD 45 Infected prosthetic mesh of abdominal wall with GIANT abscess s/p removal 08/14/2013 Dr. 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  Diabetes mellitus, insulin dependent (IDDM), uncontrolled  HYPERLIPIDEMIA  Obesity, Class III, BMI 40-49.9 (morbid obesity)  OBSTRUCTIVE SLEEP APNEA  Acute  renal insufficiency from fistula/wound loss-improved  Deconditioning New onset fever (101.8) 09-28-13  Plan: 1. Cont current care for abdomen. 2. Patient c/o green sputum and cough.  D/w Dr. Coralyn Pear.  He will obtain a CXR to r/o PNA and UA for basic fever work up.  Appreciate his assistance with this patient.   LOS: 48 days    Cinsere Mizrahi E 09/28/2013, 10:20 AM Pager: [phone]

## 2013-09-28 NOTE — Progress Notes (Signed)
ANTIBIOTIC CONSULT NOTE - INITIAL  Pharmacy Consult for Vancomycin, Zosyn Indication: rule out pneumonia (HCAP)  No Known Allergies  Patient Measurements: Height: 6' (182.9 cm) Weight: 344 lb 2.2 oz (156.1 kg) IBW/kg (Calculated) : 77.6  Vital Signs: Temp: 99.5 F (37.5 C) (12/23 0932) Temp src: Oral (12/23 0932) BP: 165/72 mmHg (12/23 0411) Pulse Rate: 86 (12/23 0411) Intake/Output from previous day: 12/22 0701 - 12/23 0700 In: 4200 [I.V.:960; TPN:3240] Out: 825 [Urine:825]  Labs:  Recent Labs  09/27/13 0602 09/28/13 0705  WBC 8.5  --   HGB 9.9*  --   PLT 292  --   CREATININE 0.92 1.00   Estimated Creatinine Clearance: 121.1 ml/min (by C-G formula based on Cr of 1).  Microbiology: No results found for this or any previous visit (from the past 720 hour(s)).   Anti-infectives:  11/8 >> Zosyn >> 11/25 11/9 >> Vanco >> 11/25   Assessment: 62 yo M admitted 11/5 with large abscess s/p I&D and removal of gore-tex on 11/8.  Pharmacy was asked to dose IV vancomycin and Zosyn from 11/8 - 11/25 for abscess/cellulitis/sepsis.  He has been off antibiotics for 28 days.  Pt reports cough and green sputum with fever and chills overnight.  Pharmacy is now re-consulted to dose IV vancomycin and Zosyn for suspected pneumonia.  Tmax: 101.8  WBCs: remains wnl  Renal: Scr 1, CrCl > 100 CG and ~ 80 N  Previous Vancomycin trough level was elevated while SCr was also elevated.  SCr has since improved.  Goal of Therapy:  Vancomycin trough level 15-20 mcg/ml Appropriate abx dosing, eradication of infection.   Plan:   Zosyn 3.375g IV Q8H infused over 4hrs.  Vancomycin 2500mg  IV x1 dose, then 1500 mg IV q12h.  Measure Vanc trough at steady state.  Follow up renal fxn and culture results.   Gretta Arab PharmD, BCPS Pager [phone] 09/28/2013 11:15 AM

## 2013-09-28 NOTE — Progress Notes (Signed)
MD notified that patient refused blood cultures. Would not allow lab to stick. No new orders. Will continue to monitor patient Kurt Cross

## 2013-09-28 NOTE — Progress Notes (Signed)
Patient ID: Kurt Cross, male   DOB: [birth year], 62 y.o.   MRN: [id number] [facility] PROGRESS NOTE  Kurt Cross [id number] DOB: 1951/02/26 DOA: 08/11/2013 PCP: [name], MD   Interim Summary                                                                  Patient is a pleasant 62 year old gentleman with a past medical history of chronic systolic congestive heart failure, status post defibrillator placement, obesity, obstructive sleep apnea, type 2 diabetes mellitus, who was admitted to the medicine service on 08/11/2013. Prior to this he had been recently discharged on 08/05/2013 after being hospitalized for community-acquired pneumonia. He presented with complaints of right leg pain and inability to ambulate. Initial imaging studies of right hip were negative. He was found to be febrile, with leukocytosis for which a CT scan of abdomen and pelvis was performed on 08/14/2013 showing a 14.5 x 18.1 x 26.1 cm fluid collection/abscess within the anterior abdominal wall. Findings were consistent for infected mesh, general surgery was consulted immediately. He was taken to the OR on 08/14/2013 undergoing incision and drainage of abscess with Gore-Tex mesh removal. Patient also had debridement of abdominal wall and peritoneum. Seizure was performed by Dr. Michael Boston of general surgery. Postprocedure patient developed hypotension that responded to aggressive IV fluid resuscitation. Patient was felt to be septic as pulmonary critical care medicine was consulted. Patient up to this point had been on broad-spectrum empiric IV antibiotic therapy with vancomycin and Zosyn. On 08/18/2013, patient was noted to have large drainage feculent in the wound bed of abdomen. 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 He was made n.p.o., with PICC line placement and started on TPN. Eakin's pouch was placed on a low suction to prevent pooling of enteric contents into wound. He  remains n.p.o. on TPN. Attempts to get him placed to local LTAC have been unsuccessful. General surgery is following. On 09/28/2013  he developed a temperature 101.8. Patient overnight had reported having some cough with green sputum production. I ordered a chest x-Huertas which showed no active cardiopulmonary disease. CBC showed a white count of 8500 . A urinalysis showed positive nitrates and leukocyte esterase with rare bacteria. Lactic acid was within normal range at 0.8. I discussed case with general surgery, it was not felt that this represented infection of his abdominal wall. Given his extensive history and complicated hospital course, I decided to treat him empirically with IV vancomycin and Zosyn, please reassess the need for antibiotic therapy in a.m.                                                                                  Assessment/Plan  Febrile Illness  -Could be secondary to viral infection given negative chest x-Gulla. CBC showed a white count of 8500 with a lactate within normal limits. I recommended obtaining blood cultures however patient  refusing. Urinalysis did reveal nitrates and leukocyte esterase. It is also conceivable this could be related to underlying urinary tract infection. He was started on empiric IV antibiotic therapy today with a question and Zosyn.  Infected prosthetic gore-tex mesh  - with large abscess s/p I&D and removal of gore-tex on 11/8. 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  - Continue TNA   - Continuous wall suction - pt will remain in the hospital for now as unable to go to LTAC  Sepsis  - due to coag neg staph s/p 2 weeks of vancomycin, resolved HTN  - blood pressure stable  - cont coreg and lasix, hydralazine, imdur  #4 atrial fibrillation/atrial flutter  - secondary to problem #1. Now ressolved, CE x 3 neg, 2 D ECHO EF 55%  - currently rate controlled on Coreg  - CHADSVASc score is 4 (not  ASVD plaque on abd aorta), resumed eliquis  #5 chronic kidney disease stage III  - stable, Cr WNL  #6 leukocytosis/fever  - resolved.  #7 chronic combined systolic and diastolic CHF  - weight stable around 150 kg or 330lbs  - clinically compensated  OSA,  - stable. Continue CPAP.  #8 Uncontrolled Pain  - continue prn oxycodone as needed  #9 hyperlipidemia  - Continue statin.  #10 4.5 cm ascending aortic aneurysm per CT scan of 08/01/2013  - Stable. Asymptomatic.  #11 type 2 diabetes  - continue Lantus and SSI  #12. Hypomagnesemia  - supplemented by TPN  Superficial Fungal Infection -Start Clotrimazole cream  Diet: NPO with ice chips  Access: TL PICC  IVF: TPN  Proph: apixaban   Code Status: full  Family Communication: patient alone  Disposition Plan: Patient is not a good candidate for SNF due to complexity of care, including need for continuous TPN and continuous wall suction. These interventions can easily be managed by an LTACH. The patient is otherwise stable but no bed available at [facility] and pt refusing to go to [facility] in [place].    Antibiotics  Vancomycin 08/15/13>>> 08/31/13  Zosyn 08/14/13>>>08/31/13  Procedures/Studies:  Ct Abdomen Pelvis Wo Contrast 08/14/2013 14.5 x 18.1 x 26.1 cm fluid collection/abscess within the anterior abdominal wall, as described above, incompletely visualized. The collection appears just anterior to prior ventral hernia mesh. Given the size and proximity to the skin surface, incision and drainage is suggested.  Dg Chest 2 View 08/01/2013 No active cardiopulmonary disease.  Dg Hip Complete Right 08/11/2013 No acute osseous injury of the right hip.  Ct Angio Chest Pe W/cm &/or Wo Cm 08/01/2013 4.5 cm AAA without complicating features. No evidence of acute pulmonary embolism or aortic dissection.  Dg Chest Port 1 View 08/18/2013 Right upper extremity PICC terminates in the mid superior vena cava. Stable congestive heart failure pattern.  Dg  Chest Port 1 View 08/17/2013 Stable chest with changes of congestive heart failure with pulmonary interstitial edema. Cardiac pacer in stable position.  Dg Chest Portable 1 View 08/15/2013 Stable cardiomegaly with interval worsening of pulmonary vascular congestion/ edema relative to 08/13/2013.  Dg Chest Port 1 View 08/13/2013 No acute findings. Stable cardiomegaly and pulmonary venous prominence without overt edema.  2-D echo 08/12/2013  Incision and drainage of abscess, Gore-Tex mesh removal, debridement of abdominal wall and peritoneum Consultants:  Cardiology: Dr. Terrence Dupont 08/13/2013 - signed off  General surgery:  ID: Dr Megan Salon 08/15/13 -- signed off   HPI/Subjective: No events overnight.   Objective: Filed Vitals:   09/28/13 0820 09/28/13  0932 09/28/13 1202 09/28/13 1454  BP:   131/71 168/72  Pulse:   88 85  Temp: 101.8 F (38.8 C) 99.5 F (37.5 C)  99.1 F (37.3 C)  TempSrc: Oral Oral  Oral  Resp:    18  Height:      Weight:      SpO2:    96%    Intake/Output Summary (Last 24 hours) at 09/28/13 1642 Last data filed at 09/28/13 1500  Gross per 24 hour  Intake   5830 ml  Output   1025 ml  Net   4805 ml    Exam:   General:  Pt is alert, follows commands appropriately, not in acute distress  Cardiovascular: Regular rate and rhythm, S1/S2, no murmurs, no rubs, no gallops  Respiratory: Clear to auscultation bilaterally, no wheezing, no crackles, no rhonchi  Abdomen: Soft, non tender, non distended, open abdominal wound with drainage   Extremities: No edema, pulses DP and PT palpable bilaterally  Neuro: Grossly nonfocal  Data Reviewed: Basic Metabolic Panel:  Recent Labs Lab 09/22/13 0410 09/23/13 0525 09/27/13 0602 09/28/13 0705  NA 134* 132* 134* 133*  K 3.9 3.6 3.5 4.1  CL 98 95* 98 99  CO2 29 28 26 24   GLUCOSE 116* 153* 130* 133*  BUN 24* 24* 23 26*  CREATININE 1.03 1.00 0.92 1.00  CALCIUM 9.0 8.8 8.8 8.8  MG  --  1.7 1.7  --   PHOS  --  4.7*  4.9* 4.5   Liver Function Tests:  Recent Labs Lab 09/23/13 0525 09/27/13 0602  AST 45* 49*  ALT 48 57*  ALKPHOS 81 76  BILITOT 1.1 1.1  PROT 7.3 7.1  ALBUMIN 2.2* 2.2*   CBC:  Recent Labs Lab 09/22/13 0410 09/23/13 1135 09/27/13 0602  WBC 7.6 8.5 8.5  NEUTROABS  --   --  4.6  HGB 10.3* 10.7* 9.9*  HCT 30.7* 32.3* 29.0*  MCV 91.9 92.0 92.1  PLT 314 321 292   CBG:  Recent Labs Lab 09/28/13 0016 09/28/13 0408 09/28/13 0739 09/28/13 0930 09/28/13 1136  GLUCAP 151* 137* 139* 92 86    Scheduled Meds: . acetaminophen  1,000 mg Oral TID  . antiseptic oral rinse  15 mL Mouth Rinse q12n4p  . apixaban  5 mg Oral BID  . atorvastatin  40 mg Oral QODAY  . carvedilol  3.125 mg Oral BID WC  . clotrimazole   Topical BID  . ferrous sulfate  325 mg Oral TID  . furosemide  40 mg Oral Daily  . hydrALAZINE  50 mg Oral Q8H  . insulin aspart  0-9 Units Subcutaneous Q4H  . insulin glargine  30 Units Subcutaneous QHS  . isosorbide mononitrate  15 mg Oral Daily  . lip balm  1 application Topical BID  . loperamide  4 mg Oral QID  . neomycin-bacitracin-polymyxin   Topical BID  . OxyCODONE  15 mg Oral Q12H  . piperacillin-tazobactam (ZOSYN)  IV  3.375 g Intravenous Q8H  . potassium chloride  40 mEq Oral Daily  . sodium chloride  3 mL Intravenous Q12H  . [START ON 09/29/2013] vancomycin  1,500 mg Intravenous Q12H  . zolpidem  10 mg Oral QHS   Continuous Infusions: . sodium chloride 20 mL/hr at 09/24/13 0406  . [name] KitchenTPN (CLINIMIX-E) Adult 125 mL/hr at 09/27/13 1811   And  . fat emulsion 250 mL (09/27/13 1811)  . [name] KitchenTPN (CLINIMIX-E) Adult     And  . fat emulsion  Kelvin Cellar, MD  [facility] Pager [phone]  If 7PM-7AM, please contact night-coverage [email or web address] Password [facility] 09/28/2013, 4:42 PM   LOS: 48 days

## 2013-09-28 NOTE — Progress Notes (Signed)
Agree with A&P of KO,PA. Patient comfortable sitting up in chair. CXR pending

## 2013-09-28 NOTE — Progress Notes (Signed)
NUTRITION FOLLOW UP  Intervention:   Continue TPN per Pharmacy RD to continue monitor  Nutrition Dx:   Inadequate oral intake related to EC fistula as evidenced by NPO status; ongoing  Goal:   Pt to meet >/= 90% of their estimated nutrition needs; being met by TPN  Monitor:   TPN rate; clinamix E 5/15 @125  ml/hr 24 hours per day Weight; stable Labs; Blood glucose ranging 86-151 mg/dL, low sodium, potassium WNL, magnesium WNL, phosphorus WNL, high BUN, Triglycerides still WNL (12/22), low hemoglobin, low prealbumin trending back up Diet advancement; none  Assessment:   62 y.o. male with known history of chronic systolic heart failure status post defibrillator placement last EF measured was in 2013 was 35-40%, on home oxygen, OSA noncompliant with CPAP, diabetes mellitus, hypertension and hyperlipidemia. CT scan revealed a large abdominal wall abscess.  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 TNA started 11/12 at 10pm.  Clinimix E5/15 at goal rate 125 ml/hr (3L/day) with 20% lipids at 10 ml/hr to provide 150 g protein and 2675 kcal daily. This meets 94% of estimated protein needs and 101% of estimated energy needs. Pt being transferred out of room at time of visit. Pt's weight is up today- ? accuracy vs edema. Per MD note 12/22- pt appears to be getting stronger, ambulating with his walker down to the hallway.   Weights Per nursing notes: 09/21/13 332 lbs 09/23/13  322 lbs 09/25/13 314 lbs 09/27/13 336 lbs   Height: Ht Readings from Last 1 Encounters:  08/14/13 6' (1.829 m)    Weight Status:   Wt Readings from Last 1 Encounters:  09/28/13 344 lb 2.2 oz (156.1 kg)    Re-estimated needs:  Kcal: 2370-2650 Protein: 160-180 grams Fluid: 3.3-3.5 L/day  Skin: +1 Generalized edema, non-pitting RLE and LLE edema; abdominal incisions/wounds with closed system drains  Diet Order: NPO   Intake/Output Summary (Last 24 hours) at 09/28/13 1142 Last data filed at 09/28/13  0700  Gross per 24 hour  Intake   4200 ml  Output    825 ml  Net   3375 ml    Last BM: 12/16   Labs:   Recent Labs Lab 09/23/13 0525 09/27/13 0602 09/28/13 0705  NA 132* 134* 133*  K 3.6 3.5 4.1  CL 95* 98 99  CO2 28 26 24   BUN 24* 23 26*  CREATININE 1.00 0.92 1.00  CALCIUM 8.8 8.8 8.8  MG 1.7 1.7  --   PHOS 4.7* 4.9* 4.5  GLUCOSE 153* 130* 133*    CBG (last 3)   Recent Labs  09/28/13 0408 09/28/13 0739 09/28/13 0930  GLUCAP 137* 139* 92    Scheduled Meds: . acetaminophen  1,000 mg Oral TID  . antiseptic oral rinse  15 mL Mouth Rinse q12n4p  . apixaban  5 mg Oral BID  . atorvastatin  40 mg Oral QODAY  . carvedilol  3.125 mg Oral BID WC  . clotrimazole   Topical BID  . ferrous sulfate  325 mg Oral TID  . furosemide  40 mg Oral Daily  . hydrALAZINE  50 mg Oral Q8H  . insulin aspart  0-9 Units Subcutaneous Q4H  . insulin glargine  30 Units Subcutaneous QHS  . isosorbide mononitrate  15 mg Oral Daily  . lip balm  1 application Topical BID  . loperamide  4 mg Oral QID  . neomycin-bacitracin-polymyxin   Topical BID  . OxyCODONE  15 mg Oral Q12H  . piperacillin-tazobactam (ZOSYN)  IV  3.375 g Intravenous Q8H  . potassium chloride  40 mEq Oral Daily  . sodium chloride  3 mL Intravenous Q12H  . [START ON 09/29/2013] vancomycin  1,500 mg Intravenous Q12H  . vancomycin  2,500 mg Intravenous Once  . zolpidem  10 mg Oral QHS    Continuous Infusions: . sodium chloride 20 mL/hr at 09/24/13 0406  . [name] KitchenTPN (CLINIMIX-E) Adult 125 mL/hr at 09/27/13 1811   And  . fat emulsion 250 mL (09/27/13 1811)  . [name] KitchenTPN (CLINIMIX-E) Adult     And  . fat emulsion      Pryor Ochoa RD, LDN Inpatient Clinical Dietitian Pager: [phone] After Hours Pager: [phone]

## 2013-09-29 LAB — CBC
Hemoglobin: 9.5 g/dL — ABNORMAL LOW (ref 13.0–17.0)
MCHC: 33.5 g/dL (ref 30.0–36.0)
MCV: 92.5 fL (ref 78.0–100.0)
Platelets: 253 10*3/uL (ref 150–400)
RDW: 14.3 % (ref 11.5–15.5)
WBC: 10.2 10*3/uL (ref 4.0–10.5)

## 2013-09-29 LAB — GLUCOSE, CAPILLARY
Glucose-Capillary: 133 mg/dL — ABNORMAL HIGH (ref 70–99)
Glucose-Capillary: 171 mg/dL — ABNORMAL HIGH (ref 70–99)

## 2013-09-29 LAB — BASIC METABOLIC PANEL
BUN: 24 mg/dL — ABNORMAL HIGH (ref 6–23)
Calcium: 8.7 mg/dL (ref 8.4–10.5)
Chloride: 100 mEq/L (ref 96–112)
Creatinine, Ser: 1.07 mg/dL (ref 0.50–1.35)
GFR calc Af Amer: 85 mL/min — ABNORMAL LOW (ref >90)
GFR calc non Af Amer: 74 mL/min — ABNORMAL LOW (ref >90)
Potassium: 3.6 mEq/L (ref 3.5–5.1)

## 2013-09-29 MED ORDER — M.V.I. ADULT IV INJ
INTRAVENOUS | Status: AC
  Administered 2013-09-29: 17:00:00 via INTRAVENOUS
  Filled 2013-09-29: qty 3000

## 2013-09-29 MED ORDER — FAT EMULSION 20 % IV EMUL
250.0000 mL | INTRAVENOUS | Status: AC
  Administered 2013-09-29: 250 mL via INTRAVENOUS
  Filled 2013-09-29: qty 250

## 2013-09-29 NOTE — Progress Notes (Signed)
Agree with A&P of WJ,PA. He looks as he did yesterday. No abd complaints. Ostomy working

## 2013-09-29 NOTE — Progress Notes (Signed)
Patient ID: Kurt Cross, male   DOB: 1951-07-05, 62 y.o.   MRN: [id number] [facility] PROGRESS NOTE  Kurt Cross [id number] DOB: October 29, 1950 DOA: 08/11/2013 PCP: [name], MD   Interim Summary                                                                  Patient is a pleasant 62 year old gentleman with a past medical history of chronic systolic congestive heart failure, status post defibrillator placement, obesity, obstructive sleep apnea, type 2 diabetes mellitus, who was admitted to the medicine service on 08/11/2013. Prior to this he had been recently discharged on 08/05/2013 after being hospitalized for community-acquired pneumonia. He presented with complaints of right leg pain and inability to ambulate. Initial imaging studies of right hip were negative. He was found to be febrile, with leukocytosis for which a CT scan of abdomen and pelvis was performed on 08/14/2013 showing a 14.5 x 18.1 x 26.1 cm fluid collection/abscess within the anterior abdominal wall. Findings were consistent for infected mesh, general surgery was consulted immediately. He was taken to the OR on 08/14/2013 undergoing incision and drainage of abscess with Gore-Tex mesh removal. Patient also had debridement of abdominal wall and peritoneum. Seizure was performed by Dr. Michael Boston of general surgery. Postprocedure patient developed hypotension that responded to aggressive IV fluid resuscitation. Patient was felt to be septic as pulmonary critical care medicine was consulted. Patient up to this point had been on broad-spectrum empiric IV antibiotic therapy with vancomycin and Zosyn. On 08/18/2013, patient was noted to have large drainage feculent in the wound bed of abdomen. 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 He was made n.p.o., with PICC line placement and started on TPN. Eakin's pouch was placed on a low suction to prevent pooling of enteric contents into wound. He  remains n.p.o. on TPN. Attempts to get him placed to local LTAC have been unsuccessful. General surgery is following. On 09/28/2013  he developed a temperature 101.8. Patient overnight had reported having some cough with green sputum production. I ordered a chest x-Neuhaus which showed no active cardiopulmonary disease. CBC showed a white count of 8500 . A urinalysis showed positive nitrates and leukocyte esterase with rare bacteria. Lactic acid was within normal range at 0.8. I discussed case with general surgery, it was not felt that this represented infection of his abdominal wall. Given his extensive history and complicated hospital course he was started on empirically with IV vancomycin and Zosyn, . Blood cultures were ordered yesterday, but patient refused them initially. We persuaded the patient to agree for blood cultures. One set to be drawn from the PICC line and one set peripherally.   Assessment/Plan  Febrile Illness  -Could be secondary to viral infection given negative chest x-Boerema. CBC showed a white count of 8500 with a lactate within normal limits. I recommended obtaining blood cultures however patient refusing. Urinalysis did reveal nitrates and leukocyte esterase. It is also conceivable this could be related to underlying urinary tract infection. He was started on empiric IV antibiotic therapy today with vancomycin and zosyn.   Infected prosthetic gore-tex mesh  - with large abscess s/p I&D and removal of gore-tex on 11/8. Post-operative course complicated  by enterocutaneous fistula with copious bilious drainage.  - Continue TNA   - Continuous wall suction - pt will remain in the hospital for now as unable to go to LTAC  Sepsis  - due to coag neg staph s/p 2 weeks of vancomycin, resolved HTN  - blood pressure stable  - cont coreg and lasix, hydralazine, imdur  #4 atrial fibrillation/atrial flutter  - secondary to problem #1. Now ressolved, CE x 3 neg, 2 D ECHO EF 55%  - currently  rate controlled on Coreg  - CHADSVASc score is 4 (not ASVD plaque on abd aorta), resumed eliquis  #5 chronic kidney disease stage III  - stable, Cr WNL  #6 leukocytosis/fever  - resolved.  #7 chronic combined systolic and diastolic CHF  - weight stable around 150 kg or 330lbs  - clinically compensated  OSA,  - stable. Continue CPAP.  #8 Uncontrolled Pain  - continue prn oxycodone as needed  #9 hyperlipidemia  - Continue statin.  #10 4.5 cm ascending aortic aneurysm per CT scan of 08/01/2013  - Stable. Asymptomatic.  #11 type 2 diabetes  - continue Lantus and SSI  #12. Hypomagnesemia  - supplemented by TPN  Superficial Fungal Infection -Start Clotrimazole cream  Diet: NPO with ice chips  Access: TL PICC  IVF: TPN  Proph: apixaban   Code Status: full  Family Communication: patient alone  Disposition Plan: Patient is not a good candidate for SNF due to complexity of care, including need for continuous TPN and continuous wall suction. These interventions can easily be managed by an LTACH. The patient is otherwise stable but no bed available at [facility] and pt refusing to go to [facility] in [place].    Antibiotics  Vancomycin 08/15/13>>> 08/31/13  Zosyn 08/14/13>>>08/31/13  Procedures/Studies:  Ct Abdomen Pelvis Wo Contrast 08/14/2013 14.5 x 18.1 x 26.1 cm fluid collection/abscess within the anterior abdominal wall, as described above, incompletely visualized. The collection appears just anterior to prior ventral hernia mesh. Given the size and proximity to the skin surface, incision and drainage is suggested.  Dg Chest 2 View 08/01/2013 No active cardiopulmonary disease.  Dg Hip Complete Right 08/11/2013 No acute osseous injury of the right hip.  Ct Angio Chest Pe W/cm &/or Wo Cm 08/01/2013 4.5 cm AAA without complicating features. No evidence of acute pulmonary embolism or aortic dissection.  Dg Chest Port 1 View 08/18/2013 Right upper extremity PICC terminates in the mid superior  vena cava. Stable congestive heart failure pattern.  Dg Chest Port 1 View 08/17/2013 Stable chest with changes of congestive heart failure with pulmonary interstitial edema. Cardiac pacer in stable position.  Dg Chest Portable 1 View 08/15/2013 Stable cardiomegaly with interval worsening of pulmonary vascular congestion/ edema relative to 08/13/2013.  Dg Chest Port 1 View 08/13/2013 No acute findings. Stable cardiomegaly and pulmonary venous prominence without overt edema.  2-D echo 08/12/2013  Incision and drainage of abscess, Gore-Tex mesh removal, debridement of abdominal wall and peritoneum Consultants:  Cardiology: Dr. Terrence Dupont 08/13/2013 - signed off  General surgery:  ID: Dr Megan Salon 08/15/13 -- signed off   HPI/Subjective: No events overnight.  denies any chills, no chest pain, sob, or abdominal pain. No nausea or vomiting.  Objective: Filed Vitals:   09/28/13 2314 09/29/13 0515 09/29/13 0800 09/29/13 1327  BP:  130/59 140/52 135/50  Pulse:  69 66   Temp:  97.7 F (36.5 C)    TempSrc:  Oral    Resp: 19 18    Height:  Weight:  154.9 kg (341 lb 7.9 oz)    SpO2:  98%      Intake/Output Summary (Last 24 hours) at 09/29/13 1522 Last data filed at 09/29/13 [id number]  Gross per 24 hour  Intake 1642.42 ml  Output    950 ml  Net 692.42 ml    Exam:   General:  Pt is alert, follows commands appropriately, not in acute distress  Cardiovascular: Regular rate and rhythm, S1/S2, no murmurs, no rubs, no gallops  Respiratory: Clear to auscultation bilaterally, no wheezing, no crackles, no rhonchi  Abdomen: Soft, non tender, non distended, open abdominal wound with drainage   Extremities: No edema, pulses DP and PT palpable bilaterally  Neuro: Grossly nonfocal  Data Reviewed: Basic Metabolic Panel:  Recent Labs Lab 09/23/13 0525 09/27/13 0602 09/28/13 0705 09/29/13 0515  NA 132* 134* 133* 133*  K 3.6 3.5 4.1 3.6  CL 95* 98 99 100  CO2 28 26 24 26   GLUCOSE 153* 130*  133* 182*  BUN 24* 23 26* 24*  CREATININE 1.00 0.92 1.00 1.07  CALCIUM 8.8 8.8 8.8 8.7  MG 1.7 1.7  --   --   PHOS 4.7* 4.9* 4.5  --    Liver Function Tests:  Recent Labs Lab 09/23/13 0525 09/27/13 0602  AST 45* 49*  ALT 48 57*  ALKPHOS 81 76  BILITOT 1.1 1.1  PROT 7.3 7.1  ALBUMIN 2.2* 2.2*   CBC:  Recent Labs Lab 09/23/13 1135 09/27/13 0602 09/29/13 0515  WBC 8.5 8.5 10.2  NEUTROABS  --  4.6  --   HGB 10.7* 9.9* 9.5*  HCT 32.3* 29.0* 28.4*  MCV 92.0 92.1 92.5  PLT 321 292 253   CBG:  Recent Labs Lab 09/28/13 2017 09/28/13 2334 09/29/13 0315 09/29/13 0753 09/29/13 1136  GLUCAP 157* 171* 174* 192* 176*    Scheduled Meds: . acetaminophen  1,000 mg Oral TID  . antiseptic oral rinse  15 mL Mouth Rinse q12n4p  . apixaban  5 mg Oral BID  . atorvastatin  40 mg Oral QODAY  . carvedilol  3.125 mg Oral BID WC  . clotrimazole   Topical BID  . ferrous sulfate  325 mg Oral TID  . furosemide  40 mg Oral Daily  . hydrALAZINE  50 mg Oral Q8H  . insulin aspart  0-9 Units Subcutaneous Q4H  . insulin glargine  30 Units Subcutaneous QHS  . isosorbide mononitrate  15 mg Oral Daily  . lip balm  1 application Topical BID  . loperamide  4 mg Oral QID  . neomycin-bacitracin-polymyxin   Topical BID  . OxyCODONE  15 mg Oral Q12H  . piperacillin-tazobactam (ZOSYN)  IV  3.375 g Intravenous Q8H  . potassium chloride  40 mEq Oral Daily  . sodium chloride  3 mL Intravenous Q12H  . vancomycin  1,500 mg Intravenous Q12H  . zolpidem  10 mg Oral QHS   Continuous Infusions: . sodium chloride 20 mL/hr at 09/24/13 0406  . [name] KitchenTPN (CLINIMIX-E) Adult 125 mL/hr at 09/28/13 1736   And  . fat emulsion 250 mL (09/28/13 1737)  . [name] KitchenTPN (CLINIMIX-E) Adult     And  . fat emulsion     Anahlia Iseminger, MD  [facility] Pager [phone]  If 7PM-7AM, please contact night-coverage [email or web address] Password [email or web address] 09/29/2013, 3:22 PM   LOS: 49 days

## 2013-09-29 NOTE — Progress Notes (Signed)
46 Days Post-Op  Subjective: No real change, he was lying on suction and bag was leaking we got him and the drain repostioned and it is working now.    Objective: Vital signs in last 24 hours: Temp:  [97.7 F (36.5 C)-99.5 F (37.5 C)] 97.7 F (36.5 C) (12/24 0515) Pulse Rate:  [66-88] 66 (12/24 0800) Resp:  [18-19] 18 (12/24 0515) BP: (105-168)/(52-72) 140/52 mmHg (12/24 0800) SpO2:  [96 %-98 %] 98 % (12/24 0515) Weight:  [154.9 kg (341 lb 7.9 oz)] 154.9 kg (341 lb 7.9 oz) (12/24 0515) Last BM Date: 09/29/13 (in ostomy bag) Tm 100.2 YESTERDAY LABS ok CXR yesterday OK Intake/Output from previous day: 12/23 0701 - 12/24 0700 In: 3272.4 [IV Piggyback:1150; TPN:2122.4] Out: 1450 [Urine:850; Drains:600] Intake/Output this shift:    General appearance: alert, cooperative, no distress and sleeping on CPAP when I came in. Resp: clear to auscultation bilaterally GI: open wound unchanged, smaller is size  Lab Results:   Recent Labs  09/27/13 0602 09/29/13 0515  WBC 8.5 10.2  HGB 9.9* 9.5*  HCT 29.0* 28.4*  PLT 292 253    BMET  Recent Labs  09/28/13 0705 09/29/13 0515  NA 133* 133*  K 4.1 3.6  CL 99 100  CO2 24 26  GLUCOSE 133* 182*  BUN 26* 24*  CREATININE 1.00 1.07  CALCIUM 8.8 8.7   PT/INR No results found for this basename: LABPROT, INR,  in the last 72 hours   Recent Labs Lab 09/23/13 0525 09/27/13 0602  AST 45* 49*  ALT 48 57*  ALKPHOS 81 76  BILITOT 1.1 1.1  PROT 7.3 7.1  ALBUMIN 2.2* 2.2*     Lipase  No results found for this basename: lipase     Studies/Results: Dg Chest 2 View  09/28/2013   CLINICAL DATA:  Fever.  Weakness.  EXAM: CHEST  2 VIEW  COMPARISON:  08/18/2013  FINDINGS: Cardiac silhouette is mildly enlarged. Mediastinum is normal in contour. The lungs are clear. Right PICC tip is in the upper superior vena cava, and appears mildly retracted compared to the prior study. Left anterior chest wall sequential pacemaker is stable in  well positioned. Bony thorax is intact.  IMPRESSION: No active cardiopulmonary disease.   Electronically Signed   By: Lajean Manes M.D.   On: 09/28/2013 13:36    Medications: . acetaminophen  1,000 mg Oral TID  . antiseptic oral rinse  15 mL Mouth Rinse q12n4p  . apixaban  5 mg Oral BID  . atorvastatin  40 mg Oral QODAY  . carvedilol  3.125 mg Oral BID WC  . clotrimazole   Topical BID  . ferrous sulfate  325 mg Oral TID  . furosemide  40 mg Oral Daily  . hydrALAZINE  50 mg Oral Q8H  . insulin aspart  0-9 Units Subcutaneous Q4H  . insulin glargine  30 Units Subcutaneous QHS  . isosorbide mononitrate  15 mg Oral Daily  . lip balm  1 application Topical BID  . loperamide  4 mg Oral QID  . neomycin-bacitracin-polymyxin   Topical BID  . OxyCODONE  15 mg Oral Q12H  . piperacillin-tazobactam (ZOSYN)  IV  3.375 g Intravenous Q8H  . potassium chloride  40 mEq Oral Daily  . sodium chloride  3 mL Intravenous Q12H  . vancomycin  1,500 mg Intravenous Q12H  . zolpidem  10 mg Oral QHS    Assessment/Plan POD 46 Infected prosthetic mesh of abdominal wall with GIANT abscess s/p removal  08/14/2013 Dr. 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  Diabetes mellitus, insulin dependent (IDDM), uncontrolled  HYPERLIPIDEMIA  Obesity, Class III, BMI 40-49.9 (morbid obesity)  OBSTRUCTIVE SLEEP APNEA  Acute renal insufficiency from fistula/wound loss-improved  Deconditioning   Plan:  Continue TNA, and drain.  Restarted on zosyn and Vancomycin yesterday.       LOS: 49 days    Kurt Cross 09/29/2013

## 2013-09-29 NOTE — Consult Note (Addendum)
WOC wound follow up Wound type:Open abdomen with stomatized enteric fistula at 5 o'clock Measurement: 16cm x 10cm x 1.5cm with undermined areas unchanged from 12/18: 2.5cm at 12 o'clock, 6cm at 3 o'clock, 4cm at 6 o'clock. Wound bed:80% clean red granulation tissue with 20% loosely adhering yellow slough.  10% cleans off easily, 10% remains Drainage (amount, consistency, odor) thickening green effluent.  Half-digested pill expelled from fistula while changing pouch.  I cannot recognize pill. Periwound:intact Dressing procedure/placement/frequency:large Eakin pouch applied with drain spout directed toward right side after first dressing site of previous colostomy with saline dampened gauze and topping with dry gauze. Wound periphery is bordered with skin barrier ring strips prior to pouch application. A single piece of silicone non-adherent dressing is placed on the distal half of the wound bed and does not cover the fistula. A red robinson catheter is introduced into the pouching system and placed at the distal end of the pouch. System is attached to continuous wall suction at 75-80 mmHg. Patient tolerated procedure well.  LLQ colostomy wound is nearly healed.  Saline moistened gauze is placed over partial thickness opening and topped with dry dressing. [facility] nursing team will continue to follow, and will remain available to this patient, the nursing and medical and surgical teams. Please re-consult if needed between visits. Thanks, Maudie Flakes, MSN, RN, [place], [place], [place] [phone])

## 2013-09-29 NOTE — Progress Notes (Signed)
PARENTERAL NUTRITION CONSULT NOTE - FOLLOW UP  Pharmacy Consult for TNA Indication: Enterocutaneous fistula  No Known Allergies  Patient Measurements: Height: 6' (182.9 cm) Weight: 341 lb 7.9 oz (154.9 kg) IBW/kg (Calculated) : 77.6 Adjusted Body Weight: 102.6kg  Vital Signs: Temp: 97.7 F (36.5 C) (12/24 0515) Temp src: Oral (12/24 0515) BP: 130/59 mmHg (12/24 0515) Pulse Rate: 69 (12/24 0515) Intake/Output from previous day: 12/23 0701 - 12/24 0700 In: 3272.4 [IV Piggyback:1150; TPN:2122.4] Out: 1450 [Urine:850; Drains:600]  Labs:  Recent Labs  09/27/13 0602 09/29/13 0515  WBC 8.5 10.2  HGB 9.9* 9.5*  HCT 29.0* 28.4*  PLT 292 253    Recent Labs  09/27/13 0601 09/27/13 0602 09/28/13 0705 09/29/13 0515  NA  --  134* 133* 133*  K  --  3.5 4.1 3.6  CL  --  98 99 100  CO2  --  26 24 26   GLUCOSE  --  130* 133* 182*  BUN  --  23 26* 24*  CREATININE  --  0.92 1.00 1.07  CALCIUM  --  8.8 8.8 8.7  MG  --  1.7  --   --   PHOS  --  4.9* 4.5  --   PROT  --  7.1  --   --   ALBUMIN  --  2.2*  --   --   AST  --  49*  --   --   ALT  --  57*  --   --   ALKPHOS  --  76  --   --   BILITOT  --  1.1  --   --   PREALBUMIN  --  15.4*  --   --   TRIG 87  --   --   --     Recent Labs  09/28/13 2017 09/28/13 2334 09/29/13 0315  GLUCAP 157* 171* 174*   Insulin Requirements in the past 24 hours:   Lantus 30 units qhs.  Note Lantus requirements have fluctuated d/t occasional hypoglycemic events (Last on 12/14).  Required 11 units sensitive SSI q4h  TNA contains regular insulin 45 units per 3L bag (15 units/L, increased on 12/20)  Nutritional Goals:   Goals per RD 12/23:  [id number] kcal/day, 160-180 grams/day protein, 3.3-3.5 L/day fluid.  Clinimix E5/15 at goal rate 125 ml/hr (3L/day) with 20% lipids at 10 ml/hr to provide 150g protein and 2675 kcal daily.  Due to how much fluid can be placed in TNA bag, maximum volume is 3L.  This rate will meet >/= 90% of  estimated needs.  Current Nutrition:   NPO except for ice chips, sips with meds  Clinimix E5/15 at 156ml/hr and 20% lipids at 10 ml/hr over 24 hours  IVF: NS at [facility]: 62 yo male with large abdominal abscess with infected old Gore Tex mesh s/p I&D abscess, removal of mesh, debridement of abdominal wall and peritoneum. 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 TNA started 11/12 at 10pm.  Patient currently unable to be discharged to a facility nearby that can accommodate both TNA and drainage system.   12/24:  Pt continues 24 hour continuous TPN.  Per surgery, sandostatin d/c, imodium and iron increased, and lomotil added (monitoring for possible clogging.)  Wound size is improving, but remains on bowel rest, continuous wall suction, and unable to transfer to SNF. (insurance denied local LTAC).  On 12/21, [facility] requested re-trying cyclic TNA, but because there is no advantage for discharge planning and pt has  increased risks of fluid overload and hypoglycemia, we will continue with 24 hr continuous TPN.  IV team used alteplase to clear triple lumen PICC on [id number] w/o complications.  Noted hyperglycemia starting at 1600 yesterday; monitor closely d/t labile CBGs.  MD considering infection (pneumonia vs UTI) and was re-started on broad spectrum abx 12/23.  Labs:   Glucose: Hx DM on insulin PTA. CBGs increased overnight.  Previously had been around 130's, but last night ranged from 170's-190's.  Lantus and regular insulin in TPN last decreased on 12/14.  Electrolytes: Na 133 (unable to adjust in TNA), Phos 4.95 Corr Cal 10.14 (Ca xPhos product = 46)  Renal:  SCr slightly increased, 1.07 today.  LFTs: AST/ALT slightly elevated (49/57), Albumin 2.2 (12/22)  TGs: 87 (12/22)  Prealbumin: 10.3 (11/13), 11.1 (11/17), 11.2(11/24), 13.6 (12/1), 10.3 (12/8), 13.4 (12/15), 15.4 (12/22)  TPN Access: Triple lumen PICC placed 11/12 TPN day#: 42  Plan:   Continue Clinimix E 5/15 at 125  ml/hr with lipids 20% at 10 ml/hr over 24 hours.     Increase to insulin 50 units per 3L bag (16.6 units/L)  TNA to contain standard multivitamins and trace elements.  Continue CBG checks q4h with sensitive SSI coverage.     Continue Lantus 30 units QHS.  KCl 29mEq PO daily per MD  Continue NS at [facility] on Mondays & Thursdays.   F/u discharge plans and TNA arrangement.   Gretta Arab PharmD, BCPS Pager [phone] 09/29/2013 10:08 AM

## 2013-09-29 NOTE — Progress Notes (Signed)
Dr. Karleen Hampshire given update via phone per IV Team Nurse Blood Cultures can no longer be drawn off PICC lines. Per MD will just do the one blood culture obtained earlier

## 2013-09-30 LAB — COMPREHENSIVE METABOLIC PANEL
ALT: 86 U/L — ABNORMAL HIGH (ref 0–53)
AST: 72 U/L — ABNORMAL HIGH (ref 0–37)
Albumin: 2.1 g/dL — ABNORMAL LOW (ref 3.5–5.2)
Chloride: 99 mEq/L (ref 96–112)
Creatinine, Ser: 1 mg/dL (ref 0.50–1.35)
Potassium: 3.8 mEq/L (ref 3.5–5.1)
Total Bilirubin: 1.3 mg/dL — ABNORMAL HIGH (ref 0.3–1.2)
Total Protein: 7 g/dL (ref 6.0–8.3)

## 2013-09-30 LAB — CBC
Hemoglobin: 9.8 g/dL — ABNORMAL LOW (ref 13.0–17.0)
MCV: 92.2 fL (ref 78.0–100.0)
Platelets: 266 10*3/uL (ref 150–400)
RBC: 3.21 MIL/uL — ABNORMAL LOW (ref 4.22–5.81)
RDW: 14 % (ref 11.5–15.5)
WBC: 7.8 10*3/uL (ref 4.0–10.5)

## 2013-09-30 LAB — MAGNESIUM: Magnesium: 1.6 mg/dL (ref 1.5–2.5)

## 2013-09-30 LAB — GLUCOSE, CAPILLARY
Glucose-Capillary: 154 mg/dL — ABNORMAL HIGH (ref 70–99)
Glucose-Capillary: 162 mg/dL — ABNORMAL HIGH (ref 70–99)
Glucose-Capillary: 174 mg/dL — ABNORMAL HIGH (ref 70–99)

## 2013-09-30 LAB — VANCOMYCIN, TROUGH: Vancomycin Tr: 19.9 ug/mL (ref 10.0–20.0)

## 2013-09-30 MED ORDER — TRACE MINERALS CR-CU-F-FE-I-MN-MO-SE-ZN IV SOLN
INTRAVENOUS | Status: AC
  Administered 2013-09-30: 18:00:00 via INTRAVENOUS
  Filled 2013-09-30: qty 3000

## 2013-09-30 MED ORDER — FAT EMULSION 20 % IV EMUL
250.0000 mL | INTRAVENOUS | Status: AC
  Administered 2013-09-30: 250 mL via INTRAVENOUS
  Filled 2013-09-30: qty 250

## 2013-09-30 NOTE — Progress Notes (Signed)
Patient ID: Kurt Cross, male   DOB: 1950-10-11, 62 y.o.   MRN: [id number] [facility] PROGRESS NOTE  Kurt Cross [id number] DOB: 24-Mar-1951 DOA: 08/11/2013 PCP: [name], MD   Interim Summary                                                                  Patient is a pleasant 62 year old gentleman with a past medical history of chronic systolic congestive heart failure, status post defibrillator placement, obesity, obstructive sleep apnea, type 2 diabetes mellitus, who was admitted to the medicine service on 08/11/2013. Prior to this he had been recently discharged on 08/05/2013 after being hospitalized for community-acquired pneumonia. He presented with complaints of right leg pain and inability to ambulate. Initial imaging studies of right hip were negative. He was found to be febrile, with leukocytosis for which a CT scan of abdomen and pelvis was performed on 08/14/2013 showing a 14.5 x 18.1 x 26.1 cm fluid collection/abscess within the anterior abdominal wall. Findings were consistent for infected mesh, general surgery was consulted immediately. He was taken to the OR on 08/14/2013 undergoing incision and drainage of abscess with Gore-Tex mesh removal. Patient also had debridement of abdominal wall and peritoneum. Seizure was performed by Dr. Michael Boston of general surgery. Postprocedure patient developed hypotension that responded to aggressive IV fluid resuscitation. Patient was felt to be septic as pulmonary critical care medicine was consulted. Patient up to this point had been on broad-spectrum empiric IV antibiotic therapy with vancomycin and Zosyn. On 08/18/2013, patient was noted to have large drainage feculent in the wound bed of abdomen. 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 He was made n.p.o., with PICC line placement and started on TPN. Eakin's pouch was placed on a low suction to prevent pooling of enteric contents into wound. He  remains n.p.o. on TPN. Attempts to get him placed to local LTAC have been unsuccessful. General surgery is following. On 09/28/2013  he developed a temperature 101.8. Patient overnight had reported having some cough with green sputum production. I ordered a chest x-Nijjar which showed no active cardiopulmonary disease. CBC showed a white count of 8500 . A urinalysis showed positive nitrates and leukocyte esterase with rare bacteria. Lactic acid was within normal range at 0.8. I discussed case with general surgery, it was not felt that this represented infection of his abdominal wall. Given his extensive history and complicated hospital course he was started on empirically with IV vancomycin and Zosyn, . Blood cultures were ordered yesterday, but patient refused them initially. We persuaded the patient to agree for blood cultures. One set to be drawn from the PICC line and one set peripherally.   Assessment/Plan  Febrile Illness  -Could be secondary to viral infection given negative chest x-Keidel. CBC showed a white count of 8500 with a lactate within normal limits. I recommended obtaining blood cultures however patient refusing. Urinalysis did reveal nitrates and leukocyte esterase. It is also conceivable this could be related to underlying urinary tract infection. He was started on empiric IV antibiotic therapy today with vancomycin and zosyn. HIS cultures have been neg for 24 hours, if they are neg for 48 hours, will start transitioning to oral antibiotic therapy.  Infected prosthetic gore-tex mesh  - with large abscess s/p I&D and removal of gore-tex on 11/8. 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  - Continue TNA   - Continuous wall suction - pt will remain in the hospital for now as unable to go to LTAC  Sepsis  - due to coag neg staph s/p 2 weeks of vancomycin, resolved HTN  - blood pressure stable  - cont coreg and lasix, hydralazine, imdur  #4  atrial fibrillation/atrial flutter  - secondary to problem #1. Now ressolved, CE x 3 neg, 2 D ECHO EF 55%  - currently rate controlled on Coreg  - CHADSVASc score is 4 (not ASVD plaque on abd aorta), resumed eliquis  #5 chronic kidney disease stage III  - stable, Cr WNL  #6 leukocytosis/fever  - resolved.  #7 chronic combined systolic and diastolic CHF  - weight stable around 150 kg or 330lbs  - clinically compensated  OSA,  - stable. Continue CPAP.  #8 Uncontrolled Pain  - continue prn oxycodone as needed  #9 hyperlipidemia  - Continue statin.  #10 4.5 cm ascending aortic aneurysm per CT scan of 08/01/2013  - Stable. Asymptomatic.  #11 type 2 diabetes  - continue Lantus and SSI  #12. Hypomagnesemia  - supplemented by TPN  Superficial Fungal Infection -Start Clotrimazole cream  Diet: NPO with ice chips  Access: TL PICC  IVF: TPN  Proph: apixaban   Code Status: full  Family Communication: patient alone  Disposition Plan: Patient is not a good candidate for SNF due to complexity of care, including need for continuous TPN and continuous wall suction. These interventions can easily be managed by an LTACH. The patient is otherwise stable but no bed available at [facility] and pt refusing to go to [facility] in [place].    Antibiotics  Vancomycin 08/15/13>>> 08/31/13  Zosyn 08/14/13>>>08/31/13  Procedures/Studies:  Ct Abdomen Pelvis Wo Contrast 08/14/2013 14.5 x 18.1 x 26.1 cm fluid collection/abscess within the anterior abdominal wall, as described above, incompletely visualized. The collection appears just anterior to prior ventral hernia mesh. Given the size and proximity to the skin surface, incision and drainage is suggested.  Dg Chest 2 View 08/01/2013 No active cardiopulmonary disease.  Dg Hip Complete Right 08/11/2013 No acute osseous injury of the right hip.  Ct Angio Chest Pe W/cm &/or Wo Cm 08/01/2013 4.5 cm AAA without complicating features. No evidence of acute pulmonary  embolism or aortic dissection.  Dg Chest Port 1 View 08/18/2013 Right upper extremity PICC terminates in the mid superior vena cava. Stable congestive heart failure pattern.  Dg Chest Port 1 View 08/17/2013 Stable chest with changes of congestive heart failure with pulmonary interstitial edema. Cardiac pacer in stable position.  Dg Chest Portable 1 View 08/15/2013 Stable cardiomegaly with interval worsening of pulmonary vascular congestion/ edema relative to 08/13/2013.  Dg Chest Port 1 View 08/13/2013 No acute findings. Stable cardiomegaly and pulmonary venous prominence without overt edema.  2-D echo 08/12/2013  Incision and drainage of abscess, Gore-Tex mesh removal, debridement of abdominal wall and peritoneum Consultants:  Cardiology: Dr. Terrence Dupont 08/13/2013 - signed off  General surgery:  ID: Dr Megan Salon 08/15/13 -- signed off   HPI/Subjective: No events overnight.  denies any chills, no chest pain, sob, or abdominal pain. No nausea or vomiting.  Objective: Filed Vitals:   09/30/13 0005 09/30/13 0503 09/30/13 1026 09/30/13 1500  BP:  154/74 165/81 158/76  Pulse:  85 98 77  Temp:  98.1 F (  36.7 C) 99.6 F (37.6 C) 98.9 F (37.2 C)  TempSrc:  Oral Oral Oral  Resp: 19 18 18 16   Height:      Weight:  149.4 kg (329 lb 5.9 oz)    SpO2:  99% 93% 97%    Intake/Output Summary (Last 24 hours) at 09/30/13 1935 Last data filed at 09/30/13 1800  Gross per 24 hour  Intake 4861.25 ml  Output   2700 ml  Net 2161.25 ml    Exam:   General:  Pt is alert, follows commands appropriately, not in acute distress  Cardiovascular: Regular rate and rhythm, S1/S2, no murmurs, no rubs, no gallops  Respiratory: Clear to auscultation bilaterally, no wheezing, no crackles, no rhonchi  Abdomen: Soft, non tender, non distended, open abdominal wound with drainage   Extremities: No edema, pulses DP and PT palpable bilaterally  Neuro: Grossly nonfocal  Data Reviewed: Basic Metabolic  Panel:  Recent Labs Lab 09/27/13 0602 09/28/13 0705 09/29/13 0515 09/30/13 0508  NA 134* 133* 133* 133*  K 3.5 4.1 3.6 3.8  CL 98 99 100 99  CO2 26 24 26 23   GLUCOSE 130* 133* 182* 167*  BUN 23 26* 24* 21  CREATININE 0.92 1.00 1.07 1.00  CALCIUM 8.8 8.8 8.7 8.5  MG 1.7  --   --  1.6  PHOS 4.9* 4.5  --  4.2   Liver Function Tests:  Recent Labs Lab 09/27/13 0602 09/30/13 0508  AST 49* 72*  ALT 57* 86*  ALKPHOS 76 79  BILITOT 1.1 1.3*  PROT 7.1 7.0  ALBUMIN 2.2* 2.1*   CBC:  Recent Labs Lab 09/27/13 0602 09/29/13 0515 09/30/13 1130  WBC 8.5 10.2 7.8  NEUTROABS 4.6  --   --   HGB 9.9* 9.5* 9.8*  HCT 29.0* 28.4* 29.6*  MCV 92.1 92.5 92.2  PLT 292 253 266   CBG:  Recent Labs Lab 09/30/13 0015 09/30/13 0416 09/30/13 0835 09/30/13 1207 09/30/13 1554  GLUCAP 154* 174* 162* 160* 153*    Scheduled Meds: . acetaminophen  1,000 mg Oral TID  . antiseptic oral rinse  15 mL Mouth Rinse q12n4p  . apixaban  5 mg Oral BID  . atorvastatin  40 mg Oral QODAY  . carvedilol  3.125 mg Oral BID WC  . clotrimazole   Topical BID  . ferrous sulfate  325 mg Oral TID  . furosemide  40 mg Oral Daily  . hydrALAZINE  50 mg Oral Q8H  . insulin aspart  0-9 Units Subcutaneous Q4H  . insulin glargine  30 Units Subcutaneous QHS  . isosorbide mononitrate  15 mg Oral Daily  . lip balm  1 application Topical BID  . loperamide  4 mg Oral QID  . neomycin-bacitracin-polymyxin   Topical BID  . OxyCODONE  15 mg Oral Q12H  . piperacillin-tazobactam (ZOSYN)  IV  3.375 g Intravenous Q8H  . potassium chloride  40 mEq Oral Daily  . sodium chloride  3 mL Intravenous Q12H  . vancomycin  1,500 mg Intravenous Q12H  . zolpidem  10 mg Oral QHS   Continuous Infusions: . sodium chloride 20 mL/hr at 09/24/13 0406  . [name] KitchenTPN (CLINIMIX-E) Adult 125 mL/hr at 09/30/13 1738   And  . fat emulsion 250 mL (09/30/13 1737)   Hosie Poisson, MD  [facility] Pager [phone]  If 7PM-7AM, please contact  night-coverage [email or web address] Password [facility] 09/30/2013, 7:35 PM   LOS: 50 days

## 2013-09-30 NOTE — Progress Notes (Signed)
CARE MANAGE MENT UTILIZATION REVIEW NOTE 09/30/2013     Patient:  Cross,[name]   Account Number:  [id number]  Documented by:  Dessa Phi   Per Ur Regulation Reviewed for med. necessity/level of care/duration of stay

## 2013-09-30 NOTE — Progress Notes (Signed)
Patient ID: Kurt Cross, male   DOB: 11-30-1950, 62 y.o.   MRN: [id number] [facility] Surgery Progress Note:   47 Days Post-Op  Subjective: Mental status is clear.  Just transferred to 5W.  Reports the wound is getting much smaller.  Objective: Vital signs in last 24 hours: Temp:  [98.1 F (36.7 C)-99.6 F (37.6 C)] 99.6 F (37.6 C) (12/25 1026) Pulse Rate:  [64-98] 98 (12/25 1026) Resp:  [18-19] 18 (12/25 1026) BP: (135-165)/(50-81) 165/81 mmHg (12/25 1026) SpO2:  [93 %-100 %] 93 % (12/25 1026) Weight:  [329 lb 5.9 oz (149.4 kg)] 329 lb 5.9 oz (149.4 kg) (12/25 0503)  Intake/Output from previous day: 12/24 0701 - 12/25 0700 In: 4330.5 [P.O.:60; IV Piggyback:1150; TPN:3120.5] Out: 2600 [Urine:2000; Drains:600] Intake/Output this shift: Total I/O In: 10 [I.V.:10] Out: 200 [Urine:200]  Physical Exam: Work of breathing is normal.  Fistula has been protected with ostomy.  Lab Results:  Results for orders placed during the hospital encounter of 08/11/13 (from the past 48 hour(s))  GLUCOSE, CAPILLARY     Status: None   Collection Time    09/28/13 11:36 AM      Result Value Range   Glucose-Capillary 86  70 - 99 mg/dL  LACTIC ACID, PLASMA     Status: None   Collection Time    09/28/13 12:30 PM      Result Value Range   Lactic Acid, Venous 0.8  0.5 - 2.2 mmol/L  URINALYSIS, ROUTINE W REFLEX MICROSCOPIC     Status: Abnormal   Collection Time    09/28/13  2:24 PM      Result Value Range   Color, Urine YELLOW  YELLOW   APPearance CLEAR  CLEAR   Specific Gravity, Urine 1.012  1.005 - 1.030   pH 5.5  5.0 - 8.0   Glucose, UA NEGATIVE  NEGATIVE mg/dL   Hgb urine dipstick NEGATIVE  NEGATIVE   Bilirubin Urine NEGATIVE  NEGATIVE   Ketones, ur NEGATIVE  NEGATIVE mg/dL   Protein, ur NEGATIVE  NEGATIVE mg/dL   Urobilinogen, UA 0.2  0.0 - 1.0 mg/dL   Nitrite POSITIVE (*) NEGATIVE   Leukocytes, UA TRACE (*) NEGATIVE  URINE MICROSCOPIC-ADD ON     Status: None   Collection Time   09/28/13  2:24 PM      Result Value Range   WBC, UA 11-20  <3 WBC/hpf   Bacteria, UA RARE  RARE   Urine-Other MUCOUS PRESENT    GLUCOSE, CAPILLARY     Status: Abnormal   Collection Time    09/28/13  4:01 PM      Result Value Range   Glucose-Capillary 175 (*) 70 - 99 mg/dL  GLUCOSE, CAPILLARY     Status: Abnormal   Collection Time    09/28/13  5:42 PM      Result Value Range   Glucose-Capillary 170 (*) 70 - 99 mg/dL  GLUCOSE, CAPILLARY     Status: Abnormal   Collection Time    09/28/13  8:17 PM      Result Value Range   Glucose-Capillary 157 (*) 70 - 99 mg/dL  GLUCOSE, CAPILLARY     Status: Abnormal   Collection Time    09/28/13 11:34 PM      Result Value Range   Glucose-Capillary 171 (*) 70 - 99 mg/dL  GLUCOSE, CAPILLARY     Status: Abnormal   Collection Time    09/29/13  3:15 AM      Result Value  Range   Glucose-Capillary 174 (*) 70 - 99 mg/dL  CBC     Status: Abnormal   Collection Time    09/29/13  5:15 AM      Result Value Range   WBC 10.2  4.0 - 10.5 K/uL   RBC 3.07 (*) 4.22 - 5.81 MIL/uL   Hemoglobin 9.5 (*) 13.0 - 17.0 g/dL   HCT 28.4 (*) 39.0 - 52.0 %   MCV 92.5  78.0 - 100.0 fL   MCH 30.9  26.0 - 34.0 pg   MCHC 33.5  30.0 - 36.0 g/dL   RDW 14.3  11.5 - 15.5 %   Platelets 253  150 - 400 K/uL  BASIC METABOLIC PANEL     Status: Abnormal   Collection Time    09/29/13  5:15 AM      Result Value Range   Sodium 133 (*) 135 - 145 mEq/L   Potassium 3.6  3.5 - 5.1 mEq/L   Chloride 100  96 - 112 mEq/L   CO2 26  19 - 32 mEq/L   Glucose, Bld 182 (*) 70 - 99 mg/dL   BUN 24 (*) 6 - 23 mg/dL   Creatinine, Ser 1.07  0.50 - 1.35 mg/dL   Calcium 8.7  8.4 - 10.5 mg/dL   GFR calc non Af Amer 74 (*) >90 mL/min   GFR calc Af Amer 85 (*) >90 mL/min   Comment: (NOTE)     The eGFR has been calculated using the CKD EPI equation.     This calculation has not been validated in all clinical situations.     eGFR's persistently <90 mL/min signify possible Chronic Kidney      Disease.  GLUCOSE, CAPILLARY     Status: Abnormal   Collection Time    09/29/13  7:53 AM      Result Value Range   Glucose-Capillary 192 (*) 70 - 99 mg/dL  GLUCOSE, CAPILLARY     Status: Abnormal   Collection Time    09/29/13 11:36 AM      Result Value Range   Glucose-Capillary 176 (*) 70 - 99 mg/dL  CULTURE, BLOOD (ROUTINE X 2)     Status: None   Collection Time    09/29/13  3:55 PM      Result Value Range   Specimen Description BLOOD LEFT ARM     Special Requests BOTTLES DRAWN AEROBIC AND ANAEROBIC 10CC     Culture  Setup Time       Value: 09/29/2013 22:13     Performed at [facility]   Culture       Value:        BLOOD CULTURE RECEIVED NO GROWTH TO DATE CULTURE WILL BE HELD FOR 5 DAYS BEFORE ISSUING A FINAL NEGATIVE REPORT     Performed at [facility]   Report Status PENDING    GLUCOSE, CAPILLARY     Status: Abnormal   Collection Time    09/29/13  4:44 PM      Result Value Range   Glucose-Capillary 153 (*) 70 - 99 mg/dL   Comment 1 Notify RN    GLUCOSE, CAPILLARY     Status: Abnormal   Collection Time    09/29/13  8:23 PM      Result Value Range   Glucose-Capillary 133 (*) 70 - 99 mg/dL   Comment 1 Notify RN     Comment 2 Documented in Chart    GLUCOSE, CAPILLARY  Status: Abnormal   Collection Time    09/30/13 12:15 AM      Result Value Range   Glucose-Capillary 154 (*) 70 - 99 mg/dL   Comment 1 Notify RN     Comment 2 Documented in Chart    GLUCOSE, CAPILLARY     Status: Abnormal   Collection Time    09/30/13  4:16 AM      Result Value Range   Glucose-Capillary 174 (*) 70 - 99 mg/dL   Comment 1 Notify RN     Comment 2 Documented in Chart    COMPREHENSIVE METABOLIC PANEL     Status: Abnormal   Collection Time    09/30/13  5:08 AM      Result Value Range   Sodium 133 (*) 135 - 145 mEq/L   Potassium 3.8  3.5 - 5.1 mEq/L   Chloride 99  96 - 112 mEq/L   CO2 23  19 - 32 mEq/L   Glucose, Bld 167 (*) 70 - 99 mg/dL   BUN 21  6 - 23 mg/dL    Creatinine, Ser 1.00  0.50 - 1.35 mg/dL   Calcium 8.5  8.4 - 10.5 mg/dL   Total Protein 7.0  6.0 - 8.3 g/dL   Albumin 2.1 (*) 3.5 - 5.2 g/dL   AST 72 (*) 0 - 37 U/L   ALT 86 (*) 0 - 53 U/L   Alkaline Phosphatase 79  39 - 117 U/L   Total Bilirubin 1.3 (*) 0.3 - 1.2 mg/dL   GFR calc non Af Amer 80 (*) >90 mL/min   GFR calc Af Amer >90  >90 mL/min   Comment: (NOTE)     The eGFR has been calculated using the CKD EPI equation.     This calculation has not been validated in all clinical situations.     eGFR's persistently <90 mL/min signify possible Chronic Kidney     Disease.  MAGNESIUM     Status: None   Collection Time    09/30/13  5:08 AM      Result Value Range   Magnesium 1.6  1.5 - 2.5 mg/dL  PHOSPHORUS     Status: None   Collection Time    09/30/13  5:08 AM      Result Value Range   Phosphorus 4.2  2.3 - 4.6 mg/dL  GLUCOSE, CAPILLARY     Status: Abnormal   Collection Time    09/30/13  8:35 AM      Result Value Range   Glucose-Capillary 162 (*) 70 - 99 mg/dL    Radiology/Results: Dg Chest 2 View  09/28/2013   CLINICAL DATA:  Fever.  Weakness.  EXAM: CHEST  2 VIEW  COMPARISON:  08/18/2013  FINDINGS: Cardiac silhouette is mildly enlarged. Mediastinum is normal in contour. The lungs are clear. Right PICC tip is in the upper superior vena cava, and appears mildly retracted compared to the prior study. Left anterior chest wall sequential pacemaker is stable in well positioned. Bony thorax is intact.  IMPRESSION: No active cardiopulmonary disease.   Electronically Signed   By: Lajean Manes M.D.   On: 09/28/2013 13:36    Anti-infectives: Anti-infectives   Start     Dose/Rate Route Frequency Ordered Stop   09/29/13 0000  vancomycin (VANCOCIN) 1,500 mg in sodium chloride 0.9 % 500 mL IVPB     1,500 mg 250 mL/hr over 120 Minutes Intravenous Every 12 hours 09/28/13 1115     09/28/13 1200  piperacillin-tazobactam (ZOSYN)  IVPB 3.375 g     3.375 g 12.5 mL/hr over 240 Minutes  Intravenous 3 times per day 09/28/13 1113     09/28/13 1200  vancomycin (VANCOCIN) 2,500 mg in sodium chloride 0.9 % 500 mL IVPB     2,500 mg 250 mL/hr over 120 Minutes Intravenous  Once 09/28/13 1115 09/28/13 1412   08/31/13 0800  vancomycin (VANCOCIN) 1,750 mg in sodium chloride 0.9 % 500 mL IVPB  Status:  Discontinued     1,750 mg 250 mL/hr over 120 Minutes Intravenous Every 24 hours 08/31/13 0753 08/31/13 1417   08/16/13 0800  vancomycin (VANCOCIN) 2,000 mg in sodium chloride 0.9 % 500 mL IVPB  Status:  Discontinued     2,000 mg 250 mL/hr over 120 Minutes Intravenous Every 24 hours 08/15/13 1411 08/31/13 0753   08/15/13 1500  vancomycin (VANCOCIN) 2,000 mg in sodium chloride 0.9 % 500 mL IVPB     2,000 mg 250 mL/hr over 120 Minutes Intravenous  Once 08/15/13 1411 08/15/13 1713   08/14/13 1600  piperacillin-tazobactam (ZOSYN) IVPB 3.375 g  Status:  Discontinued     3.375 g 12.5 mL/hr over 240 Minutes Intravenous Every 8 hours 08/14/13 1543 08/31/13 1417      Assessment/Plan: Problem List: Patient Active Problem List   Diagnosis Date Noted  . 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 Acute renal failure (ARF) 08/15/2013  . 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 Recurrent ventral incisional hernia 08/14/2013  . AAA (abdominal aortic aneurysm)/ 4.5 cm ascending per CT angio 08/01/13 08/12/2013  . Right leg pain 08/11/2013  . Chronic combined systolic and diastolic CHF (congestive heart failure) 08/04/2013  . Constipation, chronic 08/03/2013  . Acute-on-chronic respiratory failure secondary to probable community-acquired pneumonia 08/24/2012  . Hypertension 06/28/2012  . Diabetes mellitus, insulin dependent (IDDM), uncontrolled 09/01/2007  . HYPERLIPIDEMIA 09/01/2007  . Obesity, Class III, BMI 40-49.9 (morbid obesity) 09/01/2007  . OBSTRUCTIVE SLEEP APNEA 09/01/2007    Slow progress on enterocutaneous fistula 47 Days  Post-Op    LOS: 50 days   Matt B. Hassell Done, MD, [facility] [phone] beeper [phone]  09/30/2013 10:48 AM

## 2013-09-30 NOTE — Progress Notes (Signed)
CARE MANAGEMENT NOTE 09/30/2013  Patient:  Groner,Burton A   Account Number:  [id number]  Date Initiated:  08/12/2013  Documentation initiated by:  [facility]  Subjective/Objective Assessment:   62 Y/O M ADMITTED W/R LEG PAIN,WEAKNESS.PRIOR ADMISSION-10/26-10/30/14-RESP FAILURE.     Action/Plan:   FROM HOME W/SPOUSE.ACTIVE [facility] HHRN/PT.ACTIVE W/THN.HAS PCP,PHARMACY.   Anticipated DC Date:  10/06/2013   Anticipated DC Plan:  [place]  CM consult      Choice offered to / List presented to:             Status of service:  In process, will continue to follow Medicare Important Message given?  NA - LOS <3 / Initial given by admissions (If response is "NO", the following Medicare IM given date fields will be blank) Date Medicare IM given:   Date Additional Medicare IM given:  09/10/2013  Discharge Disposition:    Per UR Regulation:  Reviewed for med. necessity/level of care/duration of stay  If discussed at [place] of Stay Meetings, dates discussed:   08/19/2013  08/24/2013  08/26/2013  08/31/2013  09/07/2013  09/09/2013  09/14/2013  09/16/2013  09/21/2013  09/23/2013    Comments:  09/27/13 Jalyne Brodzinski RN,BSN NCM [place] CARE.TNA@125 ,BOWEL REST,ABD WOUND CONTINUOUS WALL SUCTION,WOUND CARE-DSG [place].SX,WOC FOLLOWING.  09/23/13 Boyd Litaker RN,BSN NCM [place] [facility](LTAC).COPY IN CHART,COPY GIVEN TO PATIENT(TC SPOUSE SINCE PATIENT WANTS SPOUSE NOTIFIED)INFORMED HER OF DENIAL FOR LTAC-[facility]-OON,NO OON BENEFITS.HOSPITAL PROVIDING LTAC LEVEL OF CARE.WE ARE STILL PURSUING ?SNF IF ABLE TO MANAGE BOTH TNA,& ABD WALL CONTINUOUS SUCTION.PATIENT/SPOUSE VOICED UNDERSTANDING.NOTED WOC NOTES,& RECOMMENDATIONS.SX FOLLOWING.D/C ORDER SNF WHEN BED ABVAILABLE TO STAY IN PLACE.  09/22/13 Crimson Dubberly RN,BSN NCM [address] [place]  TEL#[phone] INFORMED THAT [facility] HAS BEEN DENIED LTAC SINCE PATIENT DOES NOT HAVE OON BENEFIT, & THEIR IS AN LTAC IN [place] THAT IS IN NETWORK.MD COULD DO A PEER TO PEER W/INSURANCE DOCTOR.MD NOTIFIED.INFORMED PATIENT/SPOUSE-VIA PHONE OF THIS DECISION.[place] LEVEL OF CARE.TNA @125 /ABD WALL LOW CONTINUOUS SUCTION.  09/20/13 Waymond Meador RN,BSN NCM [phone] SPOKE TO PATIENT/SPOUSE(LEFT VM)INFORMED OF CURRENT STATUS AFTER CLARIFICATION OF ABD ABSCESS- WALL LOW CONTINUOUS SUCTION.STILL ON TNA-PICC/BOWEL REST/NPO/WOUND CARE-EAKIN POUCH-WOUND GETTING SMALLER.LTAC LEVEL OF CARE,NO SAFE D/C SINCE [facility] [place] IS TOO FAR PER PATIENT,FAMILY IS SEEKING LTAC BY [facility](INDEPENDENTLY) TO INSURANCE CO,[facility] REP IS FOLLOWING AWAITING DECISION.SINCE ON LONGTERM TNA,& ABD WALL LOW CONTINUOUS SUCTION CURRENTLY NEEDED-CAN ONLY BE PROVIDED AT LTAC LEVEL.  09/17/13 Breely Panik RN,BSN NCM [phone] SPOKE TO PATIENT,& SPOUSE(PHONE) INFORMED BOTH OF CURRENT STATUS:AWAITING RESPONSE FROM INSURANCE CO OF [facility] REQUESTING AUTH FOR LTAC(INDEPENDENTLY THROUGH PATIENT & [facility]) BOTH VOICED UNDERSTANDING.ABD WALL CONTINUOUS SUCTION/TNA-PICC/BOWEL REST/WOUND CARE-EAKIN POUCH.  09/16/13 Mallissa Lorenzen RN,BSN NCM [place] [employer] CM WHO RECEIVED [facility] REQUEST FOR AUTH, THEY ARE PROCESSING & WILL GET BACK TO ME ABOUT STATUS FROM MED DIRECTOR.SPOKE TO SPOUSE/PATIENT & INFORMED THAT WE ARE WAITING ON A RESPONSE FROM [employer] IF [facility] WILL RECEIVE [place]. PER [facility]-LTACH(INDEPENDENTLY THROUGH PATIENT REQUEST SUBMITTED INFO FOR AUTH)[facility] REP STATES HE RECEIVED A CALL FROM INSURANCE THAT THEY HAVE RECEIVED HIS INFO & ARE FOLLOWING.STILL AWAITING RESPONSE.NO CURRENT SAFE D/C PLAN.TNA/BOWEL REST/ABD WALL CONTINUOUS SUCTION.UPDATED PATIENT/SPOUSE.  09/14/13 Buckeye RN,BSN NCM [place] WHILE  INSURANCE EXPLORING LOCALLY-(THIS IS DONE INDEPENDENTLY BY  PATIENT-LTACH @ [facility]) OR SNF'S THAT CAN MANAGE ABD WALL CONTINUOUS SUCTION, & TNA.TC [facility])-INFORMED OF OUTCOME-WE WILL NOT EXPLORE THIS LTACH SINCE IT IS TOO FAR FOR PATIENT.PATIENT/SPOUSE INFORMED OF OUTCOME-PLEASED,& VERY APPRECIATIVE.MD/NURSE UPDATED.  09/13/13 Daneil Beem RN,BSN NCM [place] A BED AVAILABLE,& CAN ACCEPT PATIENT TOMORROW BY 1P.RECEIVING MD DR. HILL,RM 301,NURSE CALL REPORT [phone].SPOKE TO PATIENT IN RM WHILE MD,& NURSE PRESENT- EXPLAINED THAT HE HAS A BED @ FACILITY.HE SAYS HE IS NOT GOING TO [place].INFORMED HIM THAT IF HE CHOOSES NOT TO GO HE WILL PAY FOR HIS HOSPITAL STAY, & HIS INSURANCE WILL NOT PAY FOR HIS HOSPITAL STAY.PATIENT VOICED UNDERSTANDING. 2:15P WAITING TO TALK TO SPOUSE SINCE SHE SAID SHE WAS VERY BUSY & HAD TO GO.FINALLY SPOKE TO HER OUTSIDE OF RM WHILE IN ROUTE TO ELEVATOR SINCE SHE SAID SHE HAD TO GO.INFORMED HER OF [facility] [place] SUBMITTED ALL INFO,& THEY MAY HAVE A BED AVAILABLE AS SOON AS TOMORROW.IF THEY CHOOSE TO STAY IN HOSPITAL THEY WILL HAVE TO PAY FOR THEIR STAY.ALSO INFORMED THAT PURSUING [facility] LTACH IS AN INDEPENDENT PROCESS BETWEEN PATIENT, & INSURANCE COMPANY.SHE VOICED UNDERSTANDING.PROVIDED MY SUPPORT.EXPLAINED THE SAME TO PATIENT,HE SAID IF YOU TOLD MY WIFE THEN THAT IS GOOD FOR ME.BUT I STILL SAID I HAD TO EXPLAIN IT TO HIM ALSO.EXPLAINED THE SAME.HE VOICED UNDERSTANDING. PATIENT INFORMED ABOUT MEDICARE'S DECISION THAT THEY ARE UNABLE TO APPEAL SINCE THERE IS AN LTACH LEVEL OF CARE FOR D/C.PATIENT VOICED UNDERSTANDING,& GAVE PERMISSION FOR ME TO CONTACT HIS SPOUSE SUSAN TEL#[phone],EXPLAINED TO HER THAT WE RECEIVED A CALL FROM MEDICARE & WANTED TO KNOW IF SHE ALSO RECEIVED A CALL,SHE SAID YES & UNDERSTOOD WHAT THEY TOLD HER WHICH WAS THE SAME INFO-THEY  ARE UNABLE TO APPEAL SINCE THERE IS AN LTACH LEVEL OF CARE AVAILABLE-AN ACUTE TO ACUTE.SHE THEN SAID SHE HAD TO GO & TAKE CARE OF HER PATIENT IN THE HOME.HAVE FAXED W/CONFIRMATION UPDATED CLINICALS TO [place] TEL#[phone].ALSO SPOKE TO [employer] REP  09/11/13 Anshul Meddings RN,BSN UR NURSE [phone] RECEIVED CALL FROM MEDICARE QIO ON APPEAL SUBMITTED.PATIENT UNABLE TO APPEAL WITH AN LTACH-ACUTE TO ACUTE LEVEL OF CARE FOR D/C.NO SAFE D/C OVER WEEKEND SINCE [facility] [place] HAS NO BEDS AVAILABLE.WILL DISCUSS W/PATIENT ON MONDAY.MD/CM UPDATED.  09/10/13 Jamaira Sherk RN,BSN NCM [address] [place] CALL FROM MEDICARE QIO FOR APPEAL PROCESS.PATIENT/SPOUSE AWARE.PATIENT AWARE THAT WE WILL WAIT UNTIL A RESPONSE RECEIVED.DETAILED NOTICE IN CHART,& PATIENT HAS A COPY. SPOKE TO PATIENT/SPOUSE(ON PHONE) ABOUT MEDICARE IM NOTICE,PROVIDED VERBAL TO SPOUSE SINCE SHE WAS UNABLE TO COME TO HOSPITAL UNTIL LATER,PATIENT IN AGREEMENT.VOICED UNDERSTANDING OF IM NOTICE,& APPEAL PROCESS.[facility] HAS NO BEDS AVAILABLE TODAY, OR MONDAY, WILL CONTINUE TO UPDATE THEM-SPOKE TO MELISSA.[facility]-LT[facility] HAS ALREADY SUBMITTED CLINICALS TO INSURANCE CO.AWAITING RESPONSE.HHC NOT AN OPTION SINCE UNABLE TO PROVIDE ABD WALL CONTINUOUS SUCTION.NO SAFE D/C TODAY.MD UPDATED.  09/09/13 Tonishia Steffy RN,BSN NCM [place] SIGNED MEDICARE IM NOTICE,AWARE TO CALL ONCE D/C ORDER IN PLACE. 3:10P WENT INTO RM TO TALK TO PATIENT, & SPOUSE ABOUT MEDICARE IM NOTICE.PATIENT DEFERS TO SPOUSE TO SIGN, SPOUSE STATES SHE HAS TO LEAVE TO PICK UP SON FROM SCHOOL, BUT SHE WILL RETURN, GAVE HER MEDICARE IM NOTICE.SHE IS AWARE OF FORM & PROCESS.ALSO INFORMED HER THAT [facility] [place] STILL HAS A BED AVAILABLE, & CAN ACCEPT, THEY ARE OFFERING FOR SPOUSE TO TOUR FACILITY. SO THERE IS A SAFE D/C PLAN TO  LTACH.NURSE UPDATED,& AWARE TO CONTACT CM WHEN SPOUSE RETURNS TO RM.AWAITING D/C  ORDER. SPOKE TO PATIENT ABOUT MEDICARE IM NOTICE,&  APPEAL PROCESS.TC HIS SPOUSE PER HIS REQUEST.SHE WILL COME TO HOSPITAL,THEN WILL EXPLAIN MEDICARE IM NOTICE,& APPEAL PROCESS, & .MD UPDATED.[facility] REP [place] HAS SUBMITTED INFO TO INSURANCE CO FOR A BED OFFER.TC [facility] [place]-YOLANDA LIASON STILL HAS A BED AVAILABLE,DR. DELGADO RECEIVING MD.PATIENT CAN BE MANAGED @ AN LTACH,& NO LONGER MEETS ACUTE HOSPITAL STAY.  09/08/13 Ahri Olson RN,BSN NCM [phone] SPOKE TO PATIENT/SPOUSE IN RM ABOUT MEDICARE IM NOTICE-EXPLAINED THE REASON, & PROCESS TO APPEAL IF INSURANCE DENIES LTAC @ [facility].THEY UNDERSTAND THAT THEIR INSURANCE POLICY HAS NO OUT OF NETWORK BENEFIT.THEY BOTH VOICED UNDERSTANDING.MD UPDATED-WILL PROVIDE PROGRESS NOTE,& D/C SUMMARY.NURSE UPDATED.LTAC @ [place] STILL HAS BED AVAILABLE,& WILL ACCEPT,FAXED W/CONFIRMATION UPDATED PROGRESS NOTES.NO SNF BEDS AVAILABLE,HHC IS NOT ABLE TO PROVIDE THE LEVEL OF CARE NEEDED.  09/07/13 Tamaira Ciriello RN,BSN NCM [phone] MD HAS SPOKEN TO SPOUSE ABOUT CURRENT TREATMENT PLAN, & D/C PLANS-MD UPDATED SPOUSE ON LTAC AVAILABLE IN [place], THEY CAN CALL INSURANCE CO TO TRY TO APPEAL BUT THERE ARE NO GUARANTEES,MED DIRECTOR WILL ALSO TRY TO TALK TO INSURANCE CO-MED DIRECTOR(NO GUARANTEES), THEY MAY WANT TO CONSIDER TRANSFER TO [place] SINCE THAT IS WHERE HE WILL HAVE SURGERY EVENTUALLY.HHC IS CURRENTLY NOT AN OPTION SINCE THEY CANNOT PROVIDE CONTINUOUS ABD WALL SUCTION.  09/06/13 Ledia Hanford RN,BSN NCM [phone] LTACH APPROVED,& ACCEPTING PATIENT @ [facility] [place]-TEL#[phone] MELISSA-PATIENT DECLINES GOING-STATES TOO FAR FROM FAMILY. EXPLAINED TO PATIENT THAT THIS IS THE ONLY IN NETWORK LTACH IN HIS INSURANCE PLAN.[facility]-LTACH'S ARE NOT IN NETWORK,& PATIENT DOES NOT HAVE OUT OF NETWORK BENEFITS.EVEN CONTACTED [facility] REP TO CHECK IF ANY WAY POSSIBLE TO CONSIDER [facility],BUT NOT IN NETWORK,&  CANNOT GO OUTSIDE OF HIS BENEFIT.EXPLAINED TO PATIENT THAT THE LTACH WOULD BE A BETTER TRANSITION BEFORE GOING HOME.PATIENT WANTS TO GO  HOME [facility] ALREADY FOLLOWING BUT CAN ONLY PROVIDE INTERMITTENT SUCTION,NOT CONTINUOUS WHICH IS WHAT THE PATIENT IS GETTING CURRENTLY,THEY WILL ORDER ADDITIONAL EAKIN POUCHE'S THROUGH [facility],BUT NURSE ON FLOOR CAN GIVE PATIENT ENOUGH FOR A FEW DAYS IF HOME IS THE D/C PLAN.NOTED TNA IS GOING TO BE CYCLIC VIA PICC.WILL NEED PT TO AMBULATE TO DOCUMENT WHAT PATIENT MAY BENEFIT FROM.HAVE LEFT A VM MESSAGE W/SPOUSE TO CALL BACK.WILL CONTINUE TO MONITOR FOR PROGRESS & D/C PLANS.  09/04/13 Azai Gaffin RN,BSN NCM [phone] SPOKE TO PATIENT/SPOUSE ABOUT RECEIVING AUTH FOR LTAC IN [place],PATIENT DECLINES LTACH-SPOUSE STATES IT'S TOO FAR HE WON'T DO WELL IF FAMILY CANNOT VISIT REGULARLY,HE NODS HIS HEAD IN AGREEMENT W/SPOUSE.MD UPDATED.THEREFORE CURRENT D/C PLAN HOME [facility] STILL LOOKING INTO THE SPECIAL POUCHES BEING REQUESTED,BUT They CAN MANAGE THE [employer] INTERMMITTENT SUCTION IF ORDERED FOR HOME.NO CURRENT SNF THAT WILL ACCEPT PATIENT W/HIGH LEVEL OF NEEDS.  RECEIVED CALL FROM [facility] [place] [id number] RECEIVED AUTH FROM INSURANCE CO,& CAN ACCEPT PATIENT ON MONDAY 09/06/13 .CM TO FAX ON MONDAY D/C SUMMARY,& IF MEDICALLY STABLE & IF NO CHANGE IN STATUS.RECEIVING MD DR. Jacqulyn Bath DELGADO/RM 315/NURSE CALL REPORT TO CALL REPORT NOT UNTIL MONDAY TEL#[phone].NURSE TO ARRANGE TRANSPORTATION,& COMPLETION OF COBRA FORM,ALL TRANSFER REQUIREMENTS PER CARE LINK-SPOKE TO SAM WHO CONFIRMED THEY CAN TRANSFER, COPY OF CHART,& RADIOLOGY REPORTS,ETC.  TC [facility] [place]-[phone] WILL PRESENT CASE TO MEDICAL STAFF/[facility]ANCE CO,PROVIDED HER W/UPDATED INFO-STILL AWAITING IF THEY CAN ACCEPT,& IF INSURANCE CO WILL AUTH.[facility] STILL FOLLOWING-THEY NEED TO CONFIRM  IF THEY CAN PROVIDE [place] INTERMITTENT  SUCTION, & A DIFFERENT TYPE OF OSTOMY POUCH.UNTIL NSG RECEIVES AN MD/RM/BED THEN THEY CAN PROCEED W/TRANSFER TO LTACH IF AUTH.  09/01/13 Dariah Mcsorley RN,BSN NCM [phone] RECEIVED CALL FROM MELISSA @ [facility] [place]-LTACH NEEDING UPDATED PROGRESS NOTES.FAXED W/CONFIRMATION.WILL AWAIT RESPONSE. POD#18.ABD WALL SUCTION/WOUND DSG CHANGES-EAKIN POUCH/OR DIFFERENT TYPE,FISTULA.PICC-IV ABX,TPN.SPOKE TO [place] REP(TEL#[phone]/FAX#[phone] WHO HAS FAXED CLINICALS TO INSURANCE CO.AWAITING THIR [place].[facility]-FOLLOWING FOR SNF,& [facility] FOLLOWING FOR HOME-THEY ARE STILL CHECKING ON WHETHER THEY CAN PROVIDE SERVICES AT [facility] INTERMITTENT SUCTION,& EAKIN POUCHES-THEY WILL CONFIRM.CCS MANAGING-ABD ABSCESS/FISTULA.  08/31/13 Yakov Bergen RN,BSN NCM [phone] POD#17,ABD WALL SUCTION W/LARGE AMT THICK BROWN DRAINAGE,DSG CHANGES.PICC-IV ABX,TPN.NOTED WOC NURSE'S NOTE:IF HHC AGENCY COULD PROVIDE INTERMITTENT SUCTION,& EAKIN POUCHES.SPOKE TO [facility]ERAPY PAM CHANDLER WHO WILL TALK TO THEIR LEADERSHIP TO MAKE SURE THEY CAN PROVIDE THESE SERVICES,& EQUIPMENT BEFORE WE CONFIRM IF [place] CAN BE THE D/C PLAN.ALSO [facility] HAS SNF,BUT NO BEDS CURRENTLY-ALL INFO HAS BEEN SENT,THEY WILL LET SW KNOW IF THEY CAN PROVIDE SERVICES.ALSO STILL WAITING ON THE OUTCOME FROM AN LTACH THAT HAS SUBMITTED INFO TO INSURANCE COMPANY & ARE AWAITING A RESPONSE.  08/30/13 Karyl Sharrar RN,BSN NCM [phone] POD#16,ABD WOUND-SUCTION,FISTULA,BOWEL REST,PICC-TPN,IV ABX-LONG TERM.SX FOLLOWING.CURRENTLY LTACH'S EXPLORED-NOT IN NETWORK,NO BENEFIT.PATIENT/SPOUSE IN AGREEMENT TO LTACH BEING EXPLORED-HAVE FAXED CLINICALS TO ANOTHER LTAC WILL AWAIT RESPONSE BY TOMORROW LUNCH TIME.CURRENT D/C PLAN SNF-BUT NON ACCEPTING  W/PATIENT MEDICAL NEEDS.[facility] STILL BEHIND THE SCENES IF PLANS ARE FOR HOME W/TNA/IV ABX,& MAYBE WOUND CARE-DSG CHANGES(THEY CANNOT MANAGE WALL  SUCTION).   08/26/13 Hawkin Charo RN,BSN NCM [phone] POD#12,ABD WOUND,FISTULA,W/NOTE TO A NEW AREA-SX/WOC FOLLOWING.PICC-LONG TERM IV ABX,& TPN.CURRENT D/C PLAN SNF.NOTED SW EXPANDING SNF SEARCH.  08/25/13 Vernel Donlan RN,BSN NCM [phone] POD#11 REMOVAL OF ABD ABSCESS,DRAINED 3L PUS, INFECTED VENTRAL HERNIA MESH.FISTULA-ABD WOUND-WALL SUCTION.WOUND CARE.IV ABX-LONG TERM-PICC.TPN.NOT LTAC APPROPRIATE SINCE NOT IN NETWORK W/1 FACILITY,& OTHER FACILITY DOES NOT USE OUR SURGEONS.CURRENT D/C PLAN SNF,[facility] REP IS FOLLOWING FOR SNF THAT CAN MANAGE W/THESE NEEDS.[facility] STILL BEHIND THE SCENES IF HOME BECOMES A LAST RESORT.  08/23/13 Jassmine Vandruff RN,BSN NCM [id number] POD#9 REMOVAL OF ABD ABSCESS FROM INFECTED VENTRAL HERNIA MESH,&  REMOVAL OF 3L PUS.EC FISTULA,ABD WOUND-SUCTION-MOD OUTPUT.3L PICC-TPN @ 125,NPO,BOWEL REST.IV ABX-VANC/ZOSYN.GEN SX FOLLOWING.D/C PLAN HOME [place].PATIENT STATES GOING TO SNF WOULD COST HIM TOO MUCH.NO LTAC BENEFIT-OON/COSTLY.D/C PLAN HOME [place].[facility] KRISTEN AWARE & FOLLOWING-WILL PROVIDE CO-PAY INFO TO PATIENT.PATIENT UNDERSTANDS HHC,& WILLING TO DO WHAT IS NECESSARY TO STAY SAFE @ HOME.SPOUSE ALSO IN FAVOR OF HOME [place].  08/20/13 Shakedra Beam RN,BSN NCM [phone] [facility] FOLLOWING FOR HHC-RECOMMEND HHRN/PT/OT/AIDE.IV ABX,TNA,WOUND CARE.PICC.  08/19/13 Giordan Fordham RN,BSN NCM [phone] LTAC IS NOT AUTH PER INSURANCE CO.OUT OF NETWORK,& NO OON BENEFITS.SPOKE TO PATIENT ABOUT D/C PLANS,HE WANTS HOME [facility].ALSO SPOKE TO SPOUSE ON PHONE WHILE IN PATIENT'S RM,SHE AGREES W/PATIENT IF HE WANTS HOME.INFORMED SW WHO WILL CONFIRM W/PATIENT/SPOUSE.TC [facility] KRISTEN WARE OF D/C PLANS,& FOLLOWING.NOTED PICC-TNA,LONG TERM IV ABX.AWAIT FINAL HHC ORDERS.MD UPDATED. LTAC SCREEN HIGHLY UNLIKELY THAT INSURANCE WILL AUTHORIZE BUT HAVE NOT CONFIRMED YET.SW FOLLOWING FOR SNF.FISTULA-NPO,PICC-TNA.POD#5 ABD ABSCESS/I&D/REMOVAL/DRAIN-WOC-WOUND CARE,POUCH.OUT PUT-150ML.SX FOLLOWING-CURRENTLY NO SX.IV ABX,BOWEL  REST.THN ALSO FOLLOWING-CHRONIC MED MGMNT.[facility]-FOLLOWING ALSO.  08/18/13 Kairyn Olmeda RN,BSN NCM [phone] TRANSFER FROM SDU.ABSCESS-DRAINED,& REMOVED.NOW HAS FISTULA.IV ABX.WOUND CARE.LTAC SCREEN.  [id number] Rosana Hoes, RN, BSN, [place] [phone] Chart Reviewed for discharge and hospital needs. Discharge needs at time of review:  None present will follow for needs. Review of patient progress due on [id number].  08/13/13 Breia Ocampo RN,BSN NCM [place] 22-24,02 2LNC.D/C PLAN SNF.  08/12/13 Arshi Duarte RN,BSN NCM [phone] PT-SNF.OBSV.MD/SW/[facility] REP NOTIFIED.D/C PLAN SNF.

## 2013-09-30 NOTE — Progress Notes (Signed)
ANTIBIOTIC CONSULT NOTE - FOLLOW UP  Pharmacy Consult for Vancomycin, Zosyn Indication: Empiric (r/o pneumonia, UTI, etc)  No Known Allergies  Patient Measurements: Height: 6' (182.9 cm) Weight: 329 lb 5.9 oz (149.4 kg) IBW/kg (Calculated) : 77.6  Vital Signs: Temp: 99.6 F (37.6 C) (12/25 1026) Temp src: Oral (12/25 1026) BP: 165/81 mmHg (12/25 1026) Pulse Rate: 98 (12/25 1026) Intake/Output from previous day: 12/24 0701 - 12/25 0700 In: 4330.5 [P.O.:60; IV Piggyback:1150; TPN:3120.5] Out: 2600 [Urine:2000; Drains:600] Intake/Output from this shift: Total I/O In: 30 [I.V.:30] Out: 200 [Urine:200]  Labs:  Recent Labs  09/28/13 0705 09/29/13 0515 09/30/13 0508  WBC  --  10.2  --   HGB  --  9.5*  --   PLT  --  253  --   CREATININE 1.00 1.07 1.00   Estimated Creatinine Clearance: 118.1 ml/min (by C-G formula based on Cr of 1).  Recent Labs  09/30/13 1010  VANCOTROUGH 19.9     Microbiology: Recent Results (from the past 720 hour(s))  CULTURE, BLOOD (ROUTINE X 2)     Status: None   Collection Time    09/29/13  3:55 PM      Result Value Range Status   Specimen Description BLOOD LEFT ARM   Final   Special Requests BOTTLES DRAWN AEROBIC AND ANAEROBIC 10CC   Final   Culture  Setup Time     Final   Value: 09/29/2013 22:13     Performed at [facility]   Culture     Final   Value:        BLOOD CULTURE RECEIVED NO GROWTH TO DATE CULTURE WILL BE HELD FOR 5 DAYS BEFORE ISSUING A FINAL NEGATIVE REPORT     Performed at [facility]   Report Status PENDING   Incomplete   Anti-infectives: 11/8 >> Zosyn >> 11/25  11/9 >> Vanco >> 11/25 12/23 >> Zosyn >>  12/23 >> Vanco >>  Assessment: 62 yo M admitted 11/5 with large abscess s/p I&D and removal of gore-tex on 11/8. Pharmacy was asked to dose IV vancomycin and Zosyn from 11/8 - 11/25 for abscess/cellulitis/sepsis. He has been off antibiotics for 28 days. Pt reports cough and green sputum with fever  and chills overnight. Pharmacy is now re-consulted to dose IV vancomycin and Zosyn for suspected pneumonia.  Day #3 Vancomycin and Zosyn  Tmax: afeb  WBCs: WNL  Renal: Scr 1, CrCl > 100 ml/min CG and ~  80 ml/min N  Initially blood cultures were refused, but now are in process.    Vancomycin trough (obtainted ~ 1 hr early) is at upper end of therapeutic range.  Goal of Therapy:  Vancomycin trough level 15-20 mcg/ml Appropriate abx dosing, eradication of infection.   Plan:  Continue Zosyn 3.375g IV Q8H infused over 4hrs.  Continue Vancomycin 1500 IV q12h.  Re-check Vanc trough as needed.  Follow up renal fxn and culture results.    Gretta Arab PharmD, BCPS Pager [phone] 09/30/2013 11:40 AM

## 2013-09-30 NOTE — Progress Notes (Signed)
PARENTERAL NUTRITION CONSULT NOTE - FOLLOW UP  Pharmacy Consult for TNA Indication: Enterocutaneous fistula  No Known Allergies  Patient Measurements: Height: 6' (182.9 cm) Weight: 329 lb 5.9 oz (149.4 kg) IBW/kg (Calculated) : 77.6 Adjusted Body Weight: 102.6kg  Vital Signs: Temp: 98.1 F (36.7 C) (12/25 0503) Temp src: Oral (12/25 0503) BP: 154/74 mmHg (12/25 0503) Pulse Rate: 85 (12/25 0503) Intake/Output from previous day: 12/24 0701 - 12/25 0700 In: 4330.5 [P.O.:60; IV Piggyback:1150; TPN:3120.5] Out: 2600 [Urine:2000; Drains:600]  Labs:  Recent Labs  09/29/13 0515  WBC 10.2  HGB 9.5*  HCT 28.4*  PLT 253    Recent Labs  09/28/13 0705 09/29/13 0515 09/30/13 0508  NA 133* 133* 133*  K 4.1 3.6 3.8  CL 99 100 99  CO2 24 26 23   GLUCOSE 133* 182* 167*  BUN 26* 24* 21  CREATININE 1.00 1.07 1.00  CALCIUM 8.8 8.7 8.5  MG  --   --  1.6  PHOS 4.5  --  4.2  PROT  --   --  7.0  ALBUMIN  --   --  2.1*  AST  --   --  72*  ALT  --   --  86*  ALKPHOS  --   --  79  BILITOT  --   --  1.3*    Recent Labs  09/29/13 2023 09/30/13 0015 09/30/13 0416  GLUCAP 133* 154* 174*   Insulin Requirements in the past 24 hours:   Lantus 30 units qhs.  Note Lantus requirements have fluctuated d/t occasional hypoglycemic events (Last on 12/14).  Required 11 units sensitive SSI q4h  TNA contains regular insulin 50 units per 3L bag (16.6 units/L, increased on 12/24)  Nutritional Goals:   Goals per RD 12/23:  2370-2650 kcal/day, 160-180 grams/day protein, 3.3-3.5 L/day fluid.  Clinimix E5/15 at goal rate 125 ml/hr (3L/day) with 20% lipids at 10 ml/hr to provide 150g protein and 2675 kcal daily.  Due to how much fluid can be placed in TNA bag, maximum volume is 3L.  This rate will meet >/= 90% of estimated needs.  Current Nutrition:   NPO except for ice chips, sips with meds  Clinimix E5/15 at 123ml/hr and 20% lipids at 10 ml/hr over 24 hours  IVF: NS at  [place]: 62 yo male with large abdominal abscess with infected old Gore Tex mesh s/p I&D abscess, removal of mesh, debridement of abdominal wall and peritoneum. 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 TNA started 11/12 at 10pm.  Patient currently unable to be discharged to a facility nearby that can accommodate both TNA and drainage system.   12/25:  Pt remains on 24 hour continuous TPN with imodium, iron, and lomotil to slow output.  On 12/21, [facility] requested re-trying cyclic TNA, but because there is no advantage for discharge planning and pt has increased risks of fluid overload and hypoglycemia, we will continue with 24 hr continuous TPN.  IV team used alteplase to clear triple lumen PICC on [id number] w/o complications.  Noted hyperglycemia starting at 1600 12/23 - adjusting insulin slowly d/t occasional hypoglycemia.  Restarted on broad spectrum antibiotics empirically on 12/23.  [place] nurse notes "Half-digested pill expelled from fistula while changing pouch" but pt has not reported any other complications - follow up clinically.  Noted that LFTs are slowly trending up again (previously elevated 12/11 but improved) - follow closely.  Labs:   Glucose: Hx DM on insulin PTA. CBGs increased starting 12/23 at ~1600.  Previously had been around 130's, but became elevated > 170's.  Note Lantus requirements have fluctuated d/t occasional hypoglycemic events (Last on 12/14, lantus decreased).  Regular insulin in TPN increased on 12/24.     Electrolytes: Na 133 (unable to adjust in TNA), Mag 1.6, Phos 4.2 Corr Cal 10 (Ca xPhos product = 42)  Renal:  SCr 1, fairly stable.    LFTs: AST/ALT slightly increased (72/86), Tbili 1.3, Albumin 2.1 (12/25)  TGs: 87 (12/22)  Prealbumin: 10.3 (11/13), 11.1 (11/17), 11.2(11/24), 13.6 (12/1), 10.3 (12/8), 13.4 (12/15), 15.4 (12/22)  TPN Access: Triple lumen PICC placed 11/12 TPN day#: 43  Plan:   Continue Clinimix E 5/15 at 125 ml/hr with lipids 20%  at 10 ml/hr over 24 hours.     Continue insulin 50 units per 3L bag (16.6 units/L)  TNA to contain standard multivitamins and trace elements.  Continue CBG checks q4h with sensitive SSI coverage.     Continue Lantus 30 units QHS.  KCl 84mEq PO daily per MD  Continue NS at [place] [facility] on Mondays & Thursdays.   F/u discharge plans and TNA arrangement.   Gretta Arab PharmD, BCPS Pager [phone] 09/30/2013 9:20 AM

## 2013-10-01 LAB — GLUCOSE, CAPILLARY
Glucose-Capillary: 173 mg/dL — ABNORMAL HIGH (ref 70–99)
Glucose-Capillary: 179 mg/dL — ABNORMAL HIGH (ref 70–99)

## 2013-10-01 MED ORDER — TRACE MINERALS CR-CU-F-FE-I-MN-MO-SE-ZN IV SOLN
INTRAVENOUS | Status: AC
  Administered 2013-10-01: 18:00:00 via INTRAVENOUS
  Filled 2013-10-01: qty 3000

## 2013-10-01 MED ORDER — FAT EMULSION 20 % IV EMUL
250.0000 mL | INTRAVENOUS | Status: AC
  Administered 2013-10-01: 250 mL via INTRAVENOUS
  Filled 2013-10-01: qty 250

## 2013-10-01 NOTE — Progress Notes (Signed)
Agree with A&P of WJ,PA. Appears completely stable

## 2013-10-01 NOTE — Progress Notes (Addendum)
PARENTERAL NUTRITION CONSULT NOTE - FOLLOW UP  Pharmacy Consult for TNA Indication: Enterocutaneous fistula  No Known Allergies  Patient Measurements: Height: 6' (182.9 cm) Weight: 333 lb 5.4 oz (151.2 kg) IBW/kg (Calculated) : 77.6 Adjusted Body Weight: 102.6kg  Vital Signs: Temp: 98.3 F (36.8 C) (12/26 0610) Temp src: Oral (12/26 0610) BP: 177/85 mmHg (12/26 0610) Pulse Rate: 82 (12/26 0610) Intake/Output from previous day: 12/25 0701 - 12/26 0700 In: 1930 [I.V.:30; IV Piggyback:550; TPN:1350] Out: 2850 [Urine:1350; Drains:1500]  Labs:  Recent Labs  09/29/13 0515 09/30/13 1130  WBC 10.2 7.8  HGB 9.5* 9.8*  HCT 28.4* 29.6*  PLT 253 266    Recent Labs  09/28/13 0705 09/29/13 0515 09/30/13 0508  NA 133* 133* 133*  K 4.1 3.6 3.8  CL 99 100 99  CO2 24 26 23   GLUCOSE 133* 182* 167*  BUN 26* 24* 21  CREATININE 1.00 1.07 1.00  CALCIUM 8.8 8.7 8.5  MG  --   --  1.6  PHOS 4.5  --  4.2  PROT  --   --  7.0  ALBUMIN  --   --  2.1*  AST  --   --  72*  ALT  --   --  86*  ALKPHOS  --   --  79  BILITOT  --   --  1.3*    Recent Labs  09/30/13 1554 09/30/13 1952 09/30/13 2359  GLUCAP 153* 162* 154*   Insulin Requirements in the past 24 hours:   Lantus 30 units qhs.  Note Lantus requirements have fluctuated d/t occasional hypoglycemic events (Last on 12/14).  CBGs 139-173, required 10 units sensitive SSI q4h  TNA contains regular insulin 50 units per 3L bag (16.6 units/L, increased on 12/24)  Nutritional Goals:   Goals per RD 12/23:  2370-2650 kcal/day, 160-180 grams/day protein, 3.3-3.5 L/day fluid.  Clinimix E5/15 at goal rate 125 ml/hr (3L/day) with 20% lipids at 10 ml/hr to provide 150g protein and 2675 kcal daily.  Due to how much fluid can be placed in TNA bag, maximum volume is 3L.  This rate will meet >/= 90% of estimated needs.  Current Nutrition:   NPO except for ice chips, sips with meds  Clinimix E5/15 at 178ml/hr and 20% lipids at 10  ml/hr over 24 hours  IVF: NS at [place]: 62 yo male with large abdominal abscess with infected old Gore Tex mesh s/p I&D abscess, removal of mesh, debridement of abdominal wall and peritoneum. 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 TNA started 11/12 at 10pm. Patient currently unable to be discharged to a facility nearby that can accommodate both TNA and drainage system.   12/26: Pt remains on 24 hour continuous TPN with imodium, iron, and lomotil to slow output. On 12/21, [facility] requested re-trying cyclic TNA, but because there is no advantage for discharge planning and pt has increased risks of fluid overload and hypoglycemia, we will continue with 24 hr continuous TPN. IV team used alteplase to clear triple lumen PICC on [id number] w/o complications. Noted hyperglycemia starting at 1600 12/23 - adjusting insulin slowly d/t occasional hypoglycemia. Restarted on broad spectrum antibiotics empirically on 12/23. [place] nurse notes "Half-digested pill expelled from fistula while changing pouch" but pt has not reported any other complications - follow up clinically. Noted that LFTs are slowly trending up again (previously elevated 12/11 but improved) - follow closely.   Labs:  Glucose: Hx DM on insulin PTA. CBGs increased starting 12/23 at ~1600.  Previously had been around 130's, but became elevated > 170's. Note Lantus requirements have fluctuated d/t occasional hypoglycemic events (Last on 12/14, lantus decreased). Regular insulin in TPN increased on 12/24.  Electrolytes: WNL 12/25 except Na slightly low (unable to adjust in Clinimix)  Renal: SCr WNL, stable 12/25  LFTs: slightly increased on 12/25: AST/ALT 72/86, TBili 1.3  TGs: WNL (12/22)  Prealbumin improving, now WNL: 10.3 (11/13), 11.1 (11/17), 11.2(11/24), 13.6 (12/1), 10.3 (12/8), 13.4 (12/15), 15.4 (12/22)  TPN Access: Triple lumen PICC placed 11/12  TPN day#: 44   Plan:  Continue Clinimix E 5/15 at 125 ml/hr with lipids 20% at 10  ml/hr over 24 hours.  Continue insulin 50 units per 3L bag (16.6 units/L)  TNA to contain standard multivitamins and trace elements. Continue CBG checks q4h with sensitive SSI coverage.  Continue Lantus 30 units QHS.  KCl 65mEq PO daily per MD  Continue NS at [place] on Mondays & Thursdays.  F/u discharge plans and TNA arrangement.   Clayburn Pert, PharmD, BCPS  Pager: [phone]  10/01/2013 7:45 AM

## 2013-10-01 NOTE — Progress Notes (Signed)
CSW continues to follow this pt for d/c planning. LTAC has been recommended but is not possible due to insurance issues. No SNF offers due to medical needs at this time. CSW will continue to follow to assist with d/c planning.  Werner Lean LCSW [phone]

## 2013-10-01 NOTE — Progress Notes (Signed)
Physical Therapy Treatment Patient Details Name: Kurt Cross MRN: [id number] DOB: 09-26-1951 Today's Date: 10/01/2013 Time: [id number] PT Time Calculation (min): 23 min  PT Assessment / Plan / Recommendation  History of Present Illness Kurt Cross is a 62 y.o. male  with known history of chronic systolic heart failure status post defibrillator placement last EF measured was in 2013 was 35-40%, on home oxygen, OSA noncompliant with CPAP, diabetes mellitus, hypertension and hyperlipidemia presented to the ER because right leg pain after sustaining a fall the night prior to admission.  08/14/13: s/p abdominal abscess removal   PT Comments   Assisted pt OOB to amb in hallway.  Pt requires increased time and + 2 assist for safety/equipment.  Amb to end of hallway pt demon mild DOE such that he could not make it back so recliner brought to him.    Follow Up Recommendations  SNF     Does the patient have the potential to tolerate intense rehabilitation     Barriers to Discharge        Equipment Recommendations  None recommended by PT    Recommendations for Other Services    Frequency Min 2X/week   Progress towards PT Goals Progress towards PT goals: Progressing toward goals  Plan      Precautions / Restrictions Precautions Precautions: Fall Precaution Comments: ostomy bag on R side and wall suction to ABD Restrictions Weight Bearing Restrictions: No    Pertinent Vitals/Pain No c/o pain    Mobility  Bed Mobility Bed Mobility: Supine to Sit;Sitting - Scoot to Edge of Bed Supine to Sit: 5: Supervision Sitting - Scoot to Edge of Bed: 5: Supervision Details for Bed Mobility Assistance: with use of rails and increased time Transfers Transfers: Sit to Stand;Stand to Sit Sit to Stand: 5: Supervision;From bed Stand to Sit: 5: Supervision;To chair/3-in-1 Details for Transfer Assistance: Pt does better to shift weight forward then rise but allows self to "plop" in recliner  uncontrolled sit Ambulation/Gait Ambulation/Gait Assistance: 5: Supervision Ambulation Distance (Feet): 125 Feet Ambulation/Gait Assistance Details: increased time.  Noted mild DOE. Gait Pattern: Step-through pattern;Wide base of support;Decreased stride length;Ataxic Gait velocity: decreased    PT Goals (current goals can now be found in the care plan section)    Visit Information  Last PT Received On: 10/01/13 History of Present Illness: Kurt Cross is a 62 y.o. male  with known history of chronic systolic heart failure status post defibrillator placement last EF measured was in 2013 was 35-40%, on home oxygen, OSA noncompliant with CPAP, diabetes mellitus, hypertension and hyperlipidemia presented to the ER because right leg pain after sustaining a fall the night prior to admission.  08/14/13: s/p abdominal abscess removal    Subjective Data      Cognition       Balance     End of Session PT - End of Session Equipment Utilized During Treatment: Gait belt Activity Tolerance: Patient tolerated treatment well Patient left: in chair;with call bell/phone within reach;with family/visitor present   Rica Koyanagi  PTA [facility]  Acute  Rehab Pager      [phone]

## 2013-10-01 NOTE — Progress Notes (Signed)
Patient ID: Kurt Cross, male   DOB: 1951/06/30, 62 y.o.   MRN: [id number] [facility] PROGRESS NOTE  Kurt Cross [id number] DOB: 1951/03/20 DOA: 08/11/2013 PCP: [name], MD   Interim Summary                                                                  Patient is a pleasant 62 year old gentleman with a past medical history of chronic systolic congestive heart failure, status post defibrillator placement, obesity, obstructive sleep apnea, type 2 diabetes mellitus, who was admitted to the medicine service on 08/11/2013. Prior to this he had been recently discharged on 08/05/2013 after being hospitalized for community-acquired pneumonia. He presented with complaints of right leg pain and inability to ambulate. Initial imaging studies of right hip were negative. He was found to be febrile, with leukocytosis for which a CT scan of abdomen and pelvis was performed on 08/14/2013 showing a 14.5 x 18.1 x 26.1 cm fluid collection/abscess within the anterior abdominal wall. Findings were consistent for infected mesh, general surgery was consulted immediately. He was taken to the OR on 08/14/2013 undergoing incision and drainage of abscess with Gore-Tex mesh removal. Patient also had debridement of abdominal wall and peritoneum. Seizure was performed by Dr. Michael Boston of general surgery. Postprocedure patient developed hypotension that responded to aggressive IV fluid resuscitation. Patient was felt to be septic as pulmonary critical care medicine was consulted. Patient up to this point had been on broad-spectrum empiric IV antibiotic therapy with vancomycin and Zosyn. On 08/18/2013, patient was noted to have large drainage feculent in the wound bed of abdomen. 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 He was made n.p.o., with PICC line placement and started on TPN. Eakin's pouch was placed on a low suction to prevent pooling of enteric contents into wound. He  remains n.p.o. on TPN. Attempts to get him placed to local LTAC have been unsuccessful. General surgery is following. On 09/28/2013  he developed a temperature 101.8. Patient overnight had reported having some cough with green sputum production. I ordered a chest x-[place] which showed no active cardiopulmonary disease. CBC showed a white count of 8500 . A urinalysis showed positive nitrates and leukocyte esterase with rare bacteria. Lactic acid was within normal range at 0.8. I discussed case with general surgery, it was not felt that this represented infection of his abdominal wall. Given his extensive history and complicated hospital course he was started on empirically with IV vancomycin and Zosyn, . Blood cultures were ordered yesterday, but patient refused them initially. We persuaded the patient to agree for blood cultures. One set to be drawn from the PICC line and one set peripherally.   Assessment/Plan  Febrile Illness  -Could be secondary to viral infection given negative chest x-[place]. CBC showed a white count of 8500 with a lactate within normal limits. I recommended obtaining blood cultures however patient refusing. Urinalysis did reveal nitrates and leukocyte esterase. It is also conceivable this could be related to underlying urinary tract infection. He was started on empiric IV antibiotic therapy today with vancomycin and zosyn. HIS cultures have been neg for 48 hours, will start transitioning to oral antibiotic therapy in am. .   Infected prosthetic  gore-tex mesh  - with large abscess s/p I&D and removal of gore-tex on 11/8. 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  - Continue TNA   - Continuous wall suction - pt will remain in the hospital for now as unable to go to LTAC  Sepsis  - due to coag neg staph s/p 2 weeks of vancomycin, resolved HTN  - blood pressure stable  - cont coreg and lasix, hydralazine, imdur  #4 atrial  fibrillation/atrial flutter  - secondary to problem #1. Now ressolved, CE x 3 neg, 2 D ECHO EF 55%  - currently rate controlled on Coreg  - CHADSVASc score is 4 (not ASVD plaque on abd aorta), resumed eliquis  #5 chronic kidney disease stage III  - stable, Cr WNL  #6 leukocytosis/fever  - resolved.  #7 chronic combined systolic and diastolic CHF  - weight stable around 150 kg or 330lbs  - clinically compensated  OSA,  - stable. Continue CPAP.  #8 Uncontrolled Pain  - continue prn oxycodone as needed  #9 hyperlipidemia  - Continue statin.  #10 4.5 cm ascending aortic aneurysm per CT scan of 08/01/2013  - Stable. Asymptomatic.  #11 type 2 diabetes  - continue Lantus and SSI  #12. Hypomagnesemia  - supplemented by TPN  Superficial Fungal Infection -Start Clotrimazole cream  Diet: NPO with ice chips  Access: TL PICC  IVF: TPN  Proph: apixaban   Code Status: full  Family Communication: patient alone  Disposition Plan: Patient is not a good candidate for SNF due to complexity of care, including need for continuous TPN and continuous wall suction. These interventions can easily be managed by an LTACH. The patient is otherwise stable but no bed available at [facility] and pt refusing to go to [facility] in [place].    Antibiotics  Vancomycin 08/15/13>>> 08/31/13  Zosyn 08/14/13>>>08/31/13  Procedures/Studies:  Ct Abdomen Pelvis Wo Contrast 08/14/2013 14.5 x 18.1 x 26.1 cm fluid collection/abscess within the anterior abdominal wall, as described above, incompletely visualized. The collection appears just anterior to prior ventral hernia mesh. Given the size and proximity to the skin surface, incision and drainage is suggested.  Dg Chest 2 View 08/01/2013 No active cardiopulmonary disease.  Dg Hip Complete Right 08/11/2013 No acute osseous injury of the right hip.  Ct Angio Chest Pe W/cm &/or Wo Cm 08/01/2013 4.5 cm AAA without complicating features. No evidence of acute pulmonary embolism or  aortic dissection.  Dg Chest Port 1 View 08/18/2013 Right upper extremity PICC terminates in the mid superior vena cava. Stable congestive heart failure pattern.  Dg Chest Port 1 View 08/17/2013 Stable chest with changes of congestive heart failure with pulmonary interstitial edema. Cardiac pacer in stable position.  Dg Chest Portable 1 View 08/15/2013 Stable cardiomegaly with interval worsening of pulmonary vascular congestion/ edema relative to 08/13/2013.  Dg Chest Port 1 View 08/13/2013 No acute findings. Stable cardiomegaly and pulmonary venous prominence without overt edema.  2-D echo 08/12/2013  Incision and drainage of abscess, Gore-Tex mesh removal, debridement of abdominal wall and peritoneum Consultants:  Cardiology: Dr. Terrence Dupont 08/13/2013 - signed off  General surgery:  ID: Dr Megan Salon 08/15/13 -- signed off   HPI/Subjective: No events overnight.  denies any chills, no chest pain, sob, or abdominal pain. No nausea or vomiting.  Objective: Filed Vitals:   09/30/13 2358 10/01/13 0500 10/01/13 0610 10/01/13 1339  BP:   177/85 151/87  Pulse: 86  82 88  Temp:   98.3 F (36.8  C) 98.6 F (37 C)  TempSrc:   Oral Oral  Resp: 20  18 22   Height:      Weight:  151.2 kg (333 lb 5.4 oz)    SpO2: 97%  97% 97%    Intake/Output Summary (Last 24 hours) at 10/01/13 1911 Last data filed at 10/01/13 1800  Gross per 24 hour  Intake    160 ml  Output   1400 ml  Net  -1240 ml    Exam:   General:  Pt is alert, follows commands appropriately, not in acute distress  Cardiovascular: Regular rate and rhythm, S1/S2, no murmurs, no rubs, no gallops  Respiratory: Clear to auscultation bilaterally, no wheezing, no crackles, no rhonchi  Abdomen: Soft, non tender, non distended, open abdominal wound with drainage   Extremities: No edema, pulses DP and PT palpable bilaterally  Neuro: Grossly nonfocal  Data Reviewed: Basic Metabolic Panel:  Recent Labs Lab 09/27/13 0602 09/28/13 0705  09/29/13 0515 09/30/13 0508  NA 134* 133* 133* 133*  K 3.5 4.1 3.6 3.8  CL 98 99 100 99  CO2 26 24 26 23   GLUCOSE 130* 133* 182* 167*  BUN 23 26* 24* 21  CREATININE 0.92 1.00 1.07 1.00  CALCIUM 8.8 8.8 8.7 8.5  MG 1.7  --   --  1.6  PHOS 4.9* 4.5  --  4.2   Liver Function Tests:  Recent Labs Lab 09/27/13 0602 09/30/13 0508  AST 49* 72*  ALT 57* 86*  ALKPHOS 76 79  BILITOT 1.1 1.3*  PROT 7.1 7.0  ALBUMIN 2.2* 2.1*   CBC:  Recent Labs Lab 09/27/13 0602 09/29/13 0515 09/30/13 1130  WBC 8.5 10.2 7.8  NEUTROABS 4.6  --   --   HGB 9.9* 9.5* 9.8*  HCT 29.0* 28.4* 29.6*  MCV 92.1 92.5 92.2  PLT 292 253 266   CBG:  Recent Labs Lab 09/30/13 2359 10/01/13 0349 10/01/13 0731 10/01/13 1142 10/01/13 1649  GLUCAP 154* 139* 147* 173* 179*    Scheduled Meds: . acetaminophen  1,000 mg Oral TID  . antiseptic oral rinse  15 mL Mouth Rinse q12n4p  . apixaban  5 mg Oral BID  . atorvastatin  40 mg Oral QODAY  . carvedilol  3.125 mg Oral BID WC  . clotrimazole   Topical BID  . ferrous sulfate  325 mg Oral TID  . furosemide  40 mg Oral Daily  . hydrALAZINE  50 mg Oral Q8H  . insulin aspart  0-9 Units Subcutaneous Q4H  . insulin glargine  30 Units Subcutaneous QHS  . isosorbide mononitrate  15 mg Oral Daily  . lip balm  1 application Topical BID  . loperamide  4 mg Oral QID  . neomycin-bacitracin-polymyxin   Topical BID  . OxyCODONE  15 mg Oral Q12H  . piperacillin-tazobactam (ZOSYN)  IV  3.375 g Intravenous Q8H  . potassium chloride  40 mEq Oral Daily  . sodium chloride  3 mL Intravenous Q12H  . vancomycin  1,500 mg Intravenous Q12H  . zolpidem  10 mg Oral QHS   Continuous Infusions: . sodium chloride 20 mL/hr at 09/24/13 0406  . [name] KitchenTPN (CLINIMIX-E) Adult 125 mL/hr at 10/01/13 1732   And  . fat emulsion 250 mL (10/01/13 1732)   Hosie Poisson, MD  [facility] Pager [phone]  If 7PM-7AM, please contact night-coverage [email or web address] Password [email or web address] 10/01/2013, 7:11  PM   LOS: 51 days

## 2013-10-01 NOTE — Progress Notes (Signed)
48 Days Post-Op  Subjective: No real change.  Objective: Vital signs in last 24 hours: Temp:  [98.3 F (36.8 C)-99.6 F (37.6 C)] 98.3 F (36.8 C) (12/26 0610) Pulse Rate:  [75-98] 82 (12/26 0610) Resp:  [16-20] 18 (12/26 0610) BP: (156-177)/(76-85) 177/85 mmHg (12/26 0610) SpO2:  [93 %-98 %] 97 % (12/26 0610) Weight:  [151.2 kg (333 lb 5.4 oz)] 151.2 kg (333 lb 5.4 oz) (12/26 0500) Last BM Date: 09/29/13 (in ostomy bag. ) 1500 from the drain recorded yesterday "npo" TNA Afebrile, VSS No labs Intake/Output from previous day: 12/25 0701 - 12/26 0700 In: 1930 [I.V.:30; IV Piggyback:550; TPN:1350] Out: 2850 [Urine:1350; Drains:1500] Intake/Output this shift:    General appearance: alert, cooperative and no distress GI: soft, non-tender; bowel sounds normal; no masses,  no organomegaly and no change  Lab Results:   Recent Labs  09/29/13 0515 09/30/13 1130  WBC 10.2 7.8  HGB 9.5* 9.8*  HCT 28.4* 29.6*  PLT 253 266    BMET  Recent Labs  09/29/13 0515 09/30/13 0508  NA 133* 133*  K 3.6 3.8  CL 100 99  CO2 26 23  GLUCOSE 182* 167*  BUN 24* 21  CREATININE 1.07 1.00  CALCIUM 8.7 8.5   PT/INR No results found for this basename: LABPROT, INR,  in the last 72 hours   Recent Labs Lab 09/27/13 0602 09/30/13 0508  AST 49* 72*  ALT 57* 86*  ALKPHOS 76 79  BILITOT 1.1 1.3*  PROT 7.1 7.0  ALBUMIN 2.2* 2.1*     Lipase  No results found for this basename: lipase     Studies/Results: No results found.  Medications: . acetaminophen  1,000 mg Oral TID  . antiseptic oral rinse  15 mL Mouth Rinse q12n4p  . apixaban  5 mg Oral BID  . atorvastatin  40 mg Oral QODAY  . carvedilol  3.125 mg Oral BID WC  . clotrimazole   Topical BID  . ferrous sulfate  325 mg Oral TID  . furosemide  40 mg Oral Daily  . hydrALAZINE  50 mg Oral Q8H  . insulin aspart  0-9 Units Subcutaneous Q4H  . insulin glargine  30 Units Subcutaneous QHS  . isosorbide mononitrate  15 mg  Oral Daily  . lip balm  1 application Topical BID  . loperamide  4 mg Oral QID  . neomycin-bacitracin-polymyxin   Topical BID  . OxyCODONE  15 mg Oral Q12H  . piperacillin-tazobactam (ZOSYN)  IV  3.375 g Intravenous Q8H  . potassium chloride  40 mEq Oral Daily  . sodium chloride  3 mL Intravenous Q12H  . vancomycin  1,500 mg Intravenous Q12H  . zolpidem  10 mg Oral QHS    Assessment/Plan 1.  Infected prosthetic mesh of abdominal wall with GIANT abscess s/p removal 08/14/2013 Dr. Michael Boston  [photo of wound on 09/23/2013 note]  2.  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 3.  Sepsis  Resolved 4.  Afib on BB and chronic anticoagulation with Apixaban (Eliquis) 5.  Hx of chronic combined diastolic and systolic CHF 6.  Diabetes mellitus, insulin dependent (IDDM), uncontrolled  7.  Stage III chronic renal disease 8.  Hypertension 9.  HYPERLIPIDEMIA  10.  Obesity, Class III, BMI 40-49.9 (morbid obesity)  11.  OBSTRUCTIVE SLEEP APNEA  12.  Acute renal insufficiency from fistula/wound loss-improved  13.  Deconditioning/Malnutrition   Plan:  TNA, bowel rest   LOS: 51 days  Devynne Sturdivant 10/01/2013

## 2013-10-02 ENCOUNTER — Inpatient Hospital Stay (HOSPITAL_COMMUNITY): Payer: Medicare Other

## 2013-10-02 LAB — GLUCOSE, CAPILLARY
Glucose-Capillary: 150 mg/dL — ABNORMAL HIGH (ref 70–99)
Glucose-Capillary: 171 mg/dL — ABNORMAL HIGH (ref 70–99)

## 2013-10-02 MED ORDER — TRACE MINERALS CR-CU-F-FE-I-MN-MO-SE-ZN IV SOLN
INTRAVENOUS | Status: AC
  Administered 2013-10-02: 18:00:00 via INTRAVENOUS
  Filled 2013-10-02: qty 3000

## 2013-10-02 MED ORDER — FAT EMULSION 20 % IV EMUL
250.0000 mL | INTRAVENOUS | Status: AC
  Administered 2013-10-02: 250 mL via INTRAVENOUS
  Filled 2013-10-02: qty 250

## 2013-10-02 MED ORDER — CIPROFLOXACIN HCL 500 MG PO TABS
500.0000 mg | ORAL_TABLET | Freq: Two times a day (BID) | ORAL | Status: AC
  Administered 2013-10-02 – 2013-10-08 (×13): 500 mg via ORAL
  Filled 2013-10-02 (×16): qty 1

## 2013-10-02 NOTE — Progress Notes (Signed)
Patient ID: Kurt Cross, male   DOB: March 20, 1951, 62 y.o.   MRN: [id number] [facility] PROGRESS NOTE  Kurt Cross [id number] DOB: [birth year] DOA: 08/11/2013 PCP: [name], MD   Interim Summary                                                                  Patient is a pleasant 62 year old gentleman with a past medical history of chronic systolic congestive heart failure, status post defibrillator placement, obesity, obstructive sleep apnea, type 2 diabetes mellitus, who was admitted to the medicine service on 08/11/2013. Prior to this he had been recently discharged on 08/05/2013 after being hospitalized for community-acquired pneumonia. He presented with complaints of right leg pain and inability to ambulate. Initial imaging studies of right hip were negative. He was found to be febrile, with leukocytosis for which a CT scan of abdomen and pelvis was performed on 08/14/2013 showing a 14.5 x 18.1 x 26.1 cm fluid collection/abscess within the anterior abdominal wall. Findings were consistent for infected mesh, general surgery was consulted immediately. He was taken to the OR on 08/14/2013 undergoing incision and drainage of abscess with Gore-Tex mesh removal. Patient also had debridement of abdominal wall and peritoneum. Seizure was performed by Dr. Michael Boston of general surgery. Postprocedure patient developed hypotension that responded to aggressive IV fluid resuscitation. Patient was felt to be septic as pulmonary critical care medicine was consulted. Patient up to this point had been on broad-spectrum empiric IV antibiotic therapy with vancomycin and Zosyn. On 08/18/2013, patient was noted to have large drainage feculent in the wound bed of abdomen. 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 He was made n.p.o., with PICC line placement and started on TPN. Eakin's pouch was placed on a low suction to prevent pooling of enteric contents into wound. He  remains n.p.o. on TPN. Attempts to get him placed to local LTAC have been unsuccessful. General surgery is following. On 09/28/2013  he developed a temperature 101.8. Patient overnight had reported having some cough with green sputum production. I ordered a chest x-[place] which showed no active cardiopulmonary disease. CBC showed a white count of 8500 . A urinalysis showed positive nitrates and leukocyte esterase with rare bacteria. Lactic acid was within normal range at 0.8. I discussed case with general surgery, it was not felt that this represented infection of his abdominal wall. Given his extensive history and complicated hospital course he was started on empirically with IV vancomycin and Zosyn, . Blood cultures were ordered yesterday, but patient refused them initially. We persuaded the patient to agree for blood cultures.ONE SET of blood cultures done and have been negative for 72 hours. The broad spectrum antibiotics were stopped and cipro was added to complete the course for possible UTI.  Assessment/Plan  Febrile Illness  -Could be secondary to viral infection given negative chest x-[place]. CBC showed a white count of 8500 with a lactate within normal limits. I recommended obtaining blood cultures however patient refusing. Urinalysis did reveal nitrates and leukocyte esterase. It is also conceivable this could be related to underlying urinary tract infection. He was started on empiric IV antibiotic therapy today with vancomycin and zosyn. HIS cultures have been neg for 48  hours, will start transitioning to oral antibiotic therapy today.  .   Infected prosthetic gore-tex mesh  - with large abscess s/p I&D and removal of gore-tex on 11/8. 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  - Continue TNA   - Continuous wall suction - pt will remain in the hospital for now as unable to go to LTAC  Sepsis  - due to coag neg staph s/p 2 weeks of vancomycin,  resolved HTN  - blood pressure stable  - cont coreg and lasix, hydralazine, imdur  #4 atrial fibrillation/atrial flutter  - secondary to problem #1. Now ressolved, CE x 3 neg, 2 D ECHO EF 55%  - currently rate controlled on Coreg  - CHADSVASc score is 4 (not ASVD plaque on abd aorta), resumed eliquis  #5 chronic kidney disease stage III  - stable, Cr WNL  #6 leukocytosis/fever  - resolved.  #7 chronic combined systolic and diastolic CHF  - weight stable around 150 kg or 330lbs  - clinically compensated  OSA,  - stable. Continue CPAP.  #8 Uncontrolled Pain  - continue prn oxycodone as needed  #9 hyperlipidemia  - Continue statin.  #10 4.5 cm ascending aortic aneurysm per CT scan of 08/01/2013  - Stable. Asymptomatic.  #11 type 2 diabetes  - continue Lantus and SSI  #12. Hypomagnesemia  - supplemented by TPN  Superficial Fungal Infection -Start Clotrimazole cream  Diet: NPO with ice chips  Access: TL PICC  IVF: TPN  Proph: apixaban   Code Status: full  Family Communication: patient alone  Disposition Plan: Patient is not a good candidate for SNF due to complexity of care, including need for continuous TPN and continuous wall suction. These interventions can easily be managed by an LTACH. The patient is otherwise stable but no bed available at [facility] and pt refusing to go to [facility] in [place].    Antibiotics  Vancomycin 08/15/13>>> 08/31/13  Zosyn 08/14/13>>>08/31/13  Procedures/Studies:  Ct Abdomen Pelvis Wo Contrast 08/14/2013 14.5 x 18.1 x 26.1 cm fluid collection/abscess within the anterior abdominal wall, as described above, incompletely visualized. The collection appears just anterior to prior ventral hernia mesh. Given the size and proximity to the skin surface, incision and drainage is suggested.  Dg Chest 2 View 08/01/2013 No active cardiopulmonary disease.  Dg Hip Complete Right 08/11/2013 No acute osseous injury of the right hip.  Ct Angio Chest Pe W/cm &/or Wo  Cm 08/01/2013 4.5 cm AAA without complicating features. No evidence of acute pulmonary embolism or aortic dissection.  Dg Chest Port 1 View 08/18/2013 Right upper extremity PICC terminates in the mid superior vena cava. Stable congestive heart failure pattern.  Dg Chest Port 1 View 08/17/2013 Stable chest with changes of congestive heart failure with pulmonary interstitial edema. Cardiac pacer in stable position.  Dg Chest Portable 1 View 08/15/2013 Stable cardiomegaly with interval worsening of pulmonary vascular congestion/ edema relative to 08/13/2013.  Dg Chest Port 1 View 08/13/2013 No acute findings. Stable cardiomegaly and pulmonary venous prominence without overt edema.  2-D echo 08/12/2013  Incision and drainage of abscess, Gore-Tex mesh removal, debridement of abdominal wall and peritoneum Consultants:  Cardiology: Dr. Terrence Dupont 08/13/2013 - signed off  General surgery:  ID: Dr Megan Salon 08/15/13 -- signed off   HPI/Subjective: No events overnight.  denies any chills, no chest pain, sob, or abdominal pain. No nausea or vomiting.  Objective: Filed Vitals:   10/02/13 0520 10/02/13 0605 10/02/13 0631 10/02/13 1300  BP: 161/67 161/67  161/86  Pulse: 78   80  Temp: 98.3 F (36.8 C)   97.4 F (36.3 C)  TempSrc: Oral   Oral  Resp: 20   18  Height:      Weight:   152.7 kg (336 lb 10.3 oz)   SpO2: 98%   99%    Intake/Output Summary (Last 24 hours) at 10/02/13 1625 Last data filed at 10/02/13 1400  Gross per 24 hour  Intake    351 ml  Output   4050 ml  Net  -3699 ml    Exam:   General:  Pt is alert, follows commands appropriately, not in acute distress  Cardiovascular: Regular rate and rhythm, S1/S2, no murmurs, no rubs, no gallops  Respiratory: Clear to auscultation bilaterally, no wheezing, no crackles, no rhonchi  Abdomen: Soft, non tender, non distended, open abdominal wound with drainage   Extremities: No edema, pulses DP and PT palpable bilaterally  Neuro: Grossly  nonfocal  Data Reviewed: Basic Metabolic Panel:  Recent Labs Lab 09/27/13 0602 09/28/13 0705 09/29/13 0515 09/30/13 0508  NA 134* 133* 133* 133*  K 3.5 4.1 3.6 3.8  CL 98 99 100 99  CO2 26 24 26 23   GLUCOSE 130* 133* 182* 167*  BUN 23 26* 24* 21  CREATININE 0.92 1.00 1.07 1.00  CALCIUM 8.8 8.8 8.7 8.5  MG 1.7  --   --  1.6  PHOS 4.9* 4.5  --  4.2   Liver Function Tests:  Recent Labs Lab 09/27/13 0602 09/30/13 0508  AST 49* 72*  ALT 57* 86*  ALKPHOS 76 79  BILITOT 1.1 1.3*  PROT 7.1 7.0  ALBUMIN 2.2* 2.1*   CBC:  Recent Labs Lab 09/27/13 0602 09/29/13 0515 09/30/13 1130  WBC 8.5 10.2 7.8  NEUTROABS 4.6  --   --   HGB 9.9* 9.5* 9.8*  HCT 29.0* 28.4* 29.6*  MCV 92.1 92.5 92.2  PLT 292 253 266   CBG:  Recent Labs Lab 10/01/13 2003 10/01/13 2356 10/02/13 0354 10/02/13 0744 10/02/13 1152  GLUCAP 169* 150* 171* 164* 117*    Scheduled Meds: . acetaminophen  1,000 mg Oral TID  . antiseptic oral rinse  15 mL Mouth Rinse q12n4p  . apixaban  5 mg Oral BID  . atorvastatin  40 mg Oral QODAY  . carvedilol  3.125 mg Oral BID WC  . ciprofloxacin  500 mg Oral BID  . clotrimazole   Topical BID  . ferrous sulfate  325 mg Oral TID  . furosemide  40 mg Oral Daily  . hydrALAZINE  50 mg Oral Q8H  . insulin aspart  0-9 Units Subcutaneous Q4H  . insulin glargine  30 Units Subcutaneous QHS  . isosorbide mononitrate  15 mg Oral Daily  . lip balm  1 application Topical BID  . loperamide  4 mg Oral QID  . neomycin-bacitracin-polymyxin   Topical BID  . OxyCODONE  15 mg Oral Q12H  . potassium chloride  40 mEq Oral Daily  . sodium chloride  3 mL Intravenous Q12H  . zolpidem  10 mg Oral QHS   Continuous Infusions: . sodium chloride 20 mL/hr at 10/02/13 0600  . [name] KitchenTPN (CLINIMIX-E) Adult 125 mL/hr at 10/01/13 1732   And  . fat emulsion 250 mL (10/01/13 1732)  . [name] KitchenTPN (CLINIMIX-E) Adult     And  . fat emulsion     Anaika Santillano, MD  [facility] Pager [phone]  If  7PM-7AM, please contact night-coverage [email or web address] Password [facility] 10/02/2013, 4:25 PM  LOS: 52 days

## 2013-10-02 NOTE — Progress Notes (Signed)
Patient placed on CPAP with 3L oxygen bled in.  Patient tolerating well at this time.

## 2013-10-02 NOTE — Progress Notes (Signed)
PARENTERAL NUTRITION CONSULT NOTE - FOLLOW UP  Pharmacy Consult for TNA Indication: Enterocutaneous fistula  No Known Allergies  Patient Measurements: Height: 6' (182.9 cm) Weight: 336 lb 10.3 oz (152.7 kg) IBW/kg (Calculated) : 77.6 Adjusted Body Weight: 102.6kg  Vital Signs: Temp: 98.3 F (36.8 C) (12/27 0520) Temp src: Oral (12/27 0520) BP: 161/67 mmHg (12/27 0605) Pulse Rate: 78 (12/27 0520) Intake/Output from previous day: 12/26 0701 - 12/27 0700 In: 481 [I.V.:480] Out: 2850 [Urine:2050; Drains:800]  Labs:  Recent Labs  09/30/13 1130  WBC 7.8  HGB 9.8*  HCT 29.6*  PLT 266    Recent Labs  09/30/13 0508  NA 133*  K 3.8  CL 99  CO2 23  GLUCOSE 167*  BUN 21  CREATININE 1.00  CALCIUM 8.5  MG 1.6  PHOS 4.2  PROT 7.0  ALBUMIN 2.1*  AST 72*  ALT 86*  ALKPHOS 79  BILITOT 1.3*    Recent Labs  10/01/13 2356 10/02/13 0354 10/02/13 0744  GLUCAP 150* 171* 164*   Insulin Requirements in the past 24 hours:   Lantus 30 units qhs.  Note Lantus requirements have fluctuated d/t occasional hypoglycemic events (Last on 12/14).  CBGs 139-173, required 10 units sensitive SSI q4h  TNA contains regular insulin 50 units per 3L bag (16.6 units/L, increased on 12/24)  Nutritional Goals:   Goals per RD 12/23:  2370-2650 kcal/day, 160-180 grams/day protein, 3.3-3.5 L/day fluid.  Clinimix E5/15 at goal rate 125 ml/hr (3L/day) with 20% lipids at 10 ml/hr to provide 150g protein and 2675 kcal daily.  Due to how much fluid can be placed in TNA bag, maximum volume is 3L.  This rate will meet >/= 90% of estimated needs.  Current Nutrition:   NPO except for ice chips, sips with meds  Clinimix E5/15 at 125ml/hr and 20% lipids at 10 ml/hr over 24 hours  IVF: NS at [place]: 62 yo male with large abdominal abscess with infected old Gore Tex mesh s/p I&D abscess, removal of mesh, debridement of abdominal wall and peritoneum. Now with new small bowel  enterocutaneous fistula. TNA started 11/12 at 10pm.  Patient currently unable to be discharged to a facility nearby that can accommodate both TNA and drainage system.   12/27:  Pt remains on 24 hour continuous TPN with imodium, iron, and lomotil to slow output.  On 12/21, [facility] requested re-trying cyclic TNA, but because there is no advantage for discharge planning and pt has increased risks of fluid overload and hypoglycemia, we will continue with 24 hr continuous TPN.  IV team used alteplase to clear triple lumen PICC on [id number] w/o complications.  Noted hyperglycemia starting at 1600 12/23 - adjusting insulin slowly d/t occasional hypoglycemia.  Restarted on broad spectrum antibiotics empirically on 12/23 with plans to transition to oral abx today.  [place] nurse notes "Half-digested pill expelled from fistula while changing pouch" but pt has not reported any other complications - follow up clinically.  Noted that LFTs are slowly trending up again (previously elevated 12/11 but improved) - follow closely.  Labs: No new labs 12/27  Glucose: Hx DM on insulin PTA. Note Lantus requirements have fluctuated d/t occasional hypoglycemic events (Last on 12/14, lantus decreased).  Regular insulin in TPN increased on 12/24.     Electrolytes: WNL 12/25 except Na slightly low (unable to adjust in Clinimix)  Renal:  SCr WNL, stable 12/25    LFTs: slightly increased on 12/25: AST/ALT 72/86, TBili 1.3   TGs: WNL (12/22)  Prealbumin improving, now WNL: 10.3 (11/13), 11.1 (11/17), 11.2(11/24), 13.6 (12/1), 10.3 (12/8), 13.4 (12/15), 15.4 (12/22)  TPN Access: Triple lumen PICC placed 11/12 TPN day#: 45  Plan:   Continue Clinimix E 5/15 at 125 ml/hr with lipids 20% at 10 ml/hr over 24 hours.     Continue insulin 50 units per 3L bag (16.6 units/L)  TNA to contain standard multivitamins and trace elements.  Continue CBG checks q4h with sensitive SSI coverage.     Continue Lantus 30 units QHS.  KCl 33mEq PO  daily per MD  Continue NS at [facility] on Mondays & Thursdays.   F/u discharge plans and TNA arrangement.  Vanessa [place], PharmD, BCPS Pager: [phone] 8:55 AM Pharmacy #: [phone]

## 2013-10-02 NOTE — Progress Notes (Signed)
10-02-13   1735   IV Team Note;   Pt has TL picc in right upper arm; pt seen earlier today for drsg chg;  picc noted to be "out" 4-5cms, which is different from day of insertion;  RN notified; cxr done to verify tip placement;   cxr results reviewed by IV Team and Radiology dept;   cxr rept shows picc tip remains in upper svc;   Will continue to monitor placement of picc as pt is still on tna, lipids.    Raynelle Fanning RN  IV Team

## 2013-10-02 NOTE — Progress Notes (Signed)
Physical Therapy Treatment Patient Details Name: Kurt Cross MRN: [id number] DOB: Jun 01, 1951 Today's Date: 10/02/2013 Time: 1110-1140 PT Time Calculation (min): 30 min  PT Assessment / Plan / Recommendation  History of Present Illness Kurt Cross is a 62 y.o. male  with known history of chronic systolic heart failure status post defibrillator placement last EF measured was in 2013 was 35-40%, on home oxygen, OSA noncompliant with CPAP, diabetes mellitus, hypertension and hyperlipidemia presented to the ER because right leg pain after sustaining a fall the night prior to admission.  08/14/13: s/p abdominal abscess removal   PT Comments   Assisted pt OOB to amb in hallway.  Pt tolerated increased distance and tolerated 2 walks with one sitting rest break.  Pt is motivated to "get out of here"   Follow Up Recommendations  SNF     Does the patient have the potential to tolerate intense rehabilitation     Barriers to Discharge        Equipment Recommendations       Recommendations for Other Services    Frequency Min 2X/week   Progress towards PT Goals Progress towards PT goals: Progressing toward goals  Plan      Precautions / Restrictions Precautions Precautions: Fall Precaution Comments: ostomy bag on R side and wall suction to ABD Restrictions Weight Bearing Restrictions: No    Pertinent Vitals/Pain No c/o pain    Mobility  Bed Mobility Bed Mobility: Supine to Sit;Sitting - Scoot to Edge of Bed Supine to Sit: 6: Modified independent (Device/Increase time) Sitting - Scoot to Edge of Bed: 6: Modified independent (Device/Increase time) Details for Bed Mobility Assistance: with use of rails and increased time Transfers Transfers: Sit to Stand;Stand to Sit Sit to Stand: 5: Supervision;From bed Stand to Sit: 5: Supervision;To chair/3-in-1 Stand Pivot Transfers: 4: Min assist Details for Transfer Assistance: Pt does better to shift weight forward then rise but allows self  to "plop" in recliner uncontrolled sit Ambulation/Gait Ambulation/Gait Assistance: 5: Supervision Ambulation Distance (Feet): 220 Feet (140, 80' one sitting rest break) Assistive device: Rolling walker Ambulation/Gait Assistance Details: increased time Gait Pattern: Step-through pattern;Wide base of support;Decreased stride length;Ataxic Gait velocity: decreased     PT Goals (current goals can now be found in the care plan section)    Visit Information  Last PT Received On: 10/02/13 Assistance Needed: +1 History of Present Illness: Kurt Cross is a 62 y.o. male  with known history of chronic systolic heart failure status post defibrillator placement last EF measured was in 2013 was 35-40%, on home oxygen, OSA noncompliant with CPAP, diabetes mellitus, hypertension and hyperlipidemia presented to the ER because right leg pain after sustaining a fall the night prior to admission.  08/14/13: s/p abdominal abscess removal    Subjective Data      Cognition       Balance     End of Session PT - End of Session Activity Tolerance: Patient tolerated treatment well Patient left: in chair;with call bell/phone within reach;with family/visitor present   Rica Koyanagi  PTA [facility]  Acute  Rehab Pager      [phone]

## 2013-10-02 NOTE — Progress Notes (Signed)
Patient ID: Kurt Cross, male   DOB: 11-Jul-1951, 62 y.o.   MRN: [id number]  General Surgery - [facility] - Progress Note  Subjective: Patient comfortable, no complaints.  No requests.  Using IS this AM, on telephone.  Objective: Vital signs in last 24 hours: Temp:  [98.1 F (36.7 C)-98.6 F (37 C)] 98.3 F (36.8 C) (12/27 0520) Pulse Rate:  [65-88] 78 (12/27 0520) Resp:  [20-22] 20 (12/27 0520) BP: (144-161)/(67-87) 161/67 mmHg (12/27 0605) SpO2:  [97 %-98 %] 98 % (12/27 0520) Weight:  [336 lb 10.3 oz (152.7 kg)] 336 lb 10.3 oz (152.7 kg) (12/27 0631) Last BM Date: 09/30/13  Intake/Output from previous day: 12/26 0701 - 12/27 0700 In: 481 [I.V.:480] Out: 2850 [Urine:2050; Drains:800]  Exam: HEENT - clear, not icteric Neck - soft Chest - clear bilaterally Cor - RRR, no murmur Abd - obese, pouch dressing intact and on suction with thin bilious output Ext - no significant edema Neuro - grossly intact, no focal deficits  Lab Results:   Recent Labs  09/30/13 1130  WBC 7.8  HGB 9.8*  HCT 29.6*  PLT 266     Recent Labs  09/30/13 0508  NA 133*  K 3.8  CL 99  CO2 23  GLUCOSE 167*  BUN 21  CREATININE 1.00  CALCIUM 8.5    Studies/Results: No results found.  Assessment / Plan: 1. Infected prosthetic mesh of abdominal wall with GIANT abscess s/p removal 08/14/2013  2. 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  3. Sepsis Resolved  4. Afib on BB and chronic anticoagulation with Apixaban (Eliquis)  5. Hx of chronic combined diastolic and systolic CHF  6. Diabetes mellitus, insulin dependent (IDDM), uncontrolled  7. Stage III chronic renal disease  8. Hypertension  9. HYPERLIPIDEMIA  10. Obesity, Class III, BMI 40-49.9 (morbid obesity)  11. OBSTRUCTIVE SLEEP APNEA  12. Acute renal insufficiency from fistula/wound loss-improved  13. Deconditioning/Malnutrition  Continue wound care with  assistance of Wound/Ostomy team.  Continue NPO with TNA.  Earnstine Regal, MD, [facility] Office: [phone]  10/02/2013

## 2013-10-02 NOTE — Progress Notes (Signed)
Patient requested to be placed on cpap at 2300.  RT will check on patient again at that time.

## 2013-10-03 LAB — GLUCOSE, CAPILLARY
Glucose-Capillary: 140 mg/dL — ABNORMAL HIGH (ref 70–99)
Glucose-Capillary: 130 mg/dL — ABNORMAL HIGH (ref 70–99)
Glucose-Capillary: 151 mg/dL — ABNORMAL HIGH (ref 70–99)

## 2013-10-03 MED ORDER — FAT EMULSION 20 % IV EMUL
250.0000 mL | INTRAVENOUS | Status: AC
  Administered 2013-10-03: 250 mL via INTRAVENOUS
  Filled 2013-10-03: qty 250

## 2013-10-03 MED ORDER — TRACE MINERALS CR-CU-F-FE-I-MN-MO-SE-ZN IV SOLN
INTRAVENOUS | Status: AC
  Administered 2013-10-03: 18:00:00 via INTRAVENOUS
  Filled 2013-10-03: qty 3000

## 2013-10-03 MED ORDER — ISOSORBIDE MONONITRATE ER 30 MG PO TB24
30.0000 mg | ORAL_TABLET | Freq: Every day | ORAL | Status: DC
  Administered 2013-10-04 – 2013-10-15 (×12): 30 mg via ORAL
  Filled 2013-10-03 (×12): qty 1

## 2013-10-03 NOTE — Progress Notes (Signed)
Patient ID: Kurt Cross, male   DOB: Apr 29, 1951, 62 y.o.   MRN: [id number] [facility] PROGRESS NOTE  Kurt Cross [id number] DOB: 1950/12/07 DOA: 08/11/2013 PCP: [name], MD   Interim Summary                                                                  Patient is a pleasant 62 year old gentleman with a past medical history of chronic systolic congestive heart failure, status post defibrillator placement, obesity, obstructive sleep apnea, type 2 diabetes mellitus, who was admitted to the medicine service on 08/11/2013. Prior to this he had been recently discharged on 08/05/2013 after being hospitalized for community-acquired pneumonia. He presented with complaints of right leg pain and inability to ambulate. Initial imaging studies of right hip were negative. He was found to be febrile, with leukocytosis for which a CT scan of abdomen and pelvis was performed on 08/14/2013 showing a 14.5 x 18.1 x 26.1 cm fluid collection/abscess within the anterior abdominal wall. Findings were consistent for infected mesh, general surgery was consulted immediately. He was taken to the OR on 08/14/2013 undergoing incision and drainage of abscess with Gore-Tex mesh removal. Patient also had debridement of abdominal wall and peritoneum. Seizure was performed by Dr. Michael Boston of general surgery. Postprocedure patient developed hypotension that responded to aggressive IV fluid resuscitation. Patient was felt to be septic as pulmonary critical care medicine was consulted. Patient up to this point had been on broad-spectrum empiric IV antibiotic therapy with vancomycin and Zosyn. On 08/18/2013, patient was noted to have large drainage feculent in the wound bed of abdomen. 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 He was made n.p.o., with PICC line placement and started on TPN. Eakin's pouch was placed on a low suction to prevent pooling of enteric contents into wound. He  remains n.p.o. on TPN. Attempts to get him placed to local LTAC have been unsuccessful. General surgery is following. On 09/28/2013  he developed a temperature 101.8. Patient overnight had reported having some cough with green sputum production. I ordered a chest x-Branum which showed no active cardiopulmonary disease. CBC showed a white count of 8500 . A urinalysis showed positive nitrates and leukocyte esterase with rare bacteria. Lactic acid was within normal range at 0.8. I discussed case with general surgery, it was not felt that this represented infection of his abdominal wall. Given his extensive history and complicated hospital course he was started on empirically with IV vancomycin and Zosyn, . Blood cultures were ordered yesterday, but patient refused them initially. We persuaded the patient to agree for blood cultures.ONE SET of blood cultures done and have been negative for 72 hours. The broad spectrum antibiotics were stopped and cipro was added to complete the course for possible UTI.  Assessment/Plan  Febrile Illness  -Could be secondary to viral infection given negative chest x-Hasty. CBC showed a white count of 8500 with a lactate within normal limits. I recommended obtaining blood cultures however patient refusing. Urinalysis did reveal nitrates and leukocyte esterase. It is also conceivable this could be related to underlying urinary tract infection. He was started on empiric IV antibiotic therapy today with vancomycin and zosyn. HIS cultures have been neg for 48  hours, will start transitioning to oral antibiotic therapy today.  .   Infected prosthetic gore-tex mesh  - with large abscess s/p I&D and removal of gore-tex on 11/8. 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  - Continue TNA   - Continuous wall suction - pt will remain in the hospital for now as unable to go to LTAC  Sepsis  - due to coag neg staph s/p 2 weeks of vancomycin,  resolved HTN  - blood pressure stable  - cont coreg and lasix, hydralazine, imdur  #4 atrial fibrillation/atrial flutter  - secondary to problem #1. Now ressolved, CE x 3 neg, 2 D ECHO EF 55%  - currently rate controlled on Coreg  - CHADSVASc score is 4 (not ASVD plaque on abd aorta), resumed eliquis  #5 chronic kidney disease stage III  - stable, Cr WNL  #6 leukocytosis/fever  - resolved.  #7 chronic combined systolic and diastolic CHF  - weight stable around 150 kg or 330lbs  - clinically compensated  OSA,  - stable. Continue CPAP.  #8 Uncontrolled Pain  - continue prn oxycodone as needed  #9 hyperlipidemia  - Continue statin.  #10 4.5 cm ascending aortic aneurysm per CT scan of 08/01/2013  - Stable. Asymptomatic.  #11 type 2 diabetes  - continue Lantus and SSI  #12. Hypomagnesemia  - supplemented by TPN  Superficial Fungal Infection -Start Clotrimazole cream  Diet: NPO with ice chips  Access: TL PICC  IVF: TPN  Proph: apixaban   Code Status: full  Family Communication: patient alone  Disposition Plan: Patient is not a good candidate for SNF due to complexity of care, including need for continuous TPN and continuous wall suction. These interventions can easily be managed by an LTACH. The patient is otherwise stable but no bed available at [facility] and pt refusing to go to [facility] in [place].    Antibiotics  Vancomycin 08/15/13>>> 08/31/13  Zosyn 08/14/13>>>08/31/13  Procedures/Studies:  Ct Abdomen Pelvis Wo Contrast 08/14/2013 14.5 x 18.1 x 26.1 cm fluid collection/abscess within the anterior abdominal wall, as described above, incompletely visualized. The collection appears just anterior to prior ventral hernia mesh. Given the size and proximity to the skin surface, incision and drainage is suggested.  Dg Chest 2 View 08/01/2013 No active cardiopulmonary disease.  Dg Hip Complete Right 08/11/2013 No acute osseous injury of the right hip.  Ct Angio Chest Pe W/cm &/or Wo  Cm 08/01/2013 4.5 cm AAA without complicating features. No evidence of acute pulmonary embolism or aortic dissection.  Dg Chest Port 1 View 08/18/2013 Right upper extremity PICC terminates in the mid superior vena cava. Stable congestive heart failure pattern.  Dg Chest Port 1 View 08/17/2013 Stable chest with changes of congestive heart failure with pulmonary interstitial edema. Cardiac pacer in stable position.  Dg Chest Portable 1 View 08/15/2013 Stable cardiomegaly with interval worsening of pulmonary vascular congestion/ edema relative to 08/13/2013.  Dg Chest Port 1 View 08/13/2013 No acute findings. Stable cardiomegaly and pulmonary venous prominence without overt edema.  2-D echo 08/12/2013  Incision and drainage of abscess, Gore-Tex mesh removal, debridement of abdominal wall and peritoneum Consultants:  Cardiology: Dr. Terrence Dupont 08/13/2013 - signed off  General surgery:  ID: Dr Megan Salon 08/15/13 -- signed off   HPI/Subjective: No events overnight.  denies any chills, no chest pain, sob, or abdominal pain. No nausea or vomiting.  Objective: Filed Vitals:   10/02/13 1300 10/02/13 2200 10/03/13 0645 10/03/13 1500  BP: 161/86 138/75  187/78 159/85  Pulse: 80 63 83 65  Temp: 97.4 F (36.3 C) 97.8 F (36.6 C) 97.8 F (36.6 C) 98.1 F (36.7 C)  TempSrc: Oral Oral Oral Oral  Resp: 18 18 18 18   Height:      Weight:      SpO2: 99% 98% 97% 95%    Intake/Output Summary (Last 24 hours) at 10/03/13 1936 Last data filed at 10/03/13 1900  Gross per 24 hour  Intake   3920 ml  Output   4300 ml  Net   -380 ml    Exam:   General:  Pt is alert, follows commands appropriately, not in acute distress  Cardiovascular: Regular rate and rhythm, S1/S2, no murmurs, no rubs, no gallops  Respiratory: Clear to auscultation bilaterally, no wheezing, no crackles, no rhonchi  Abdomen: Soft, non tender, non distended, open abdominal wound with drainage   Extremities: No edema, pulses DP and PT  palpable bilaterally  Neuro: Grossly nonfocal  Data Reviewed: Basic Metabolic Panel:  Recent Labs Lab 09/27/13 0602 09/28/13 0705 09/29/13 0515 09/30/13 0508  NA 134* 133* 133* 133*  K 3.5 4.1 3.6 3.8  CL 98 99 100 99  CO2 26 24 26 23   GLUCOSE 130* 133* 182* 167*  BUN 23 26* 24* 21  CREATININE 0.92 1.00 1.07 1.00  CALCIUM 8.8 8.8 8.7 8.5  MG 1.7  --   --  1.6  PHOS 4.9* 4.5  --  4.2   Liver Function Tests:  Recent Labs Lab 09/27/13 0602 09/30/13 0508  AST 49* 72*  ALT 57* 86*  ALKPHOS 76 79  BILITOT 1.1 1.3*  PROT 7.1 7.0  ALBUMIN 2.2* 2.1*   CBC:  Recent Labs Lab 09/27/13 0602 09/29/13 0515 09/30/13 1130  WBC 8.5 10.2 7.8  NEUTROABS 4.6  --   --   HGB 9.9* 9.5* 9.8*  HCT 29.0* 28.4* 29.6*  MCV 92.1 92.5 92.2  PLT 292 253 266   CBG:  Recent Labs Lab 10/03/13 0016 10/03/13 0339 10/03/13 0748 10/03/13 1202 10/03/13 1629  GLUCAP 147* 125* 140* 130* 151*    Scheduled Meds: . acetaminophen  1,000 mg Oral TID  . antiseptic oral rinse  15 mL Mouth Rinse q12n4p  . apixaban  5 mg Oral BID  . atorvastatin  40 mg Oral QODAY  . carvedilol  3.125 mg Oral BID WC  . ciprofloxacin  500 mg Oral BID  . clotrimazole   Topical BID  . ferrous sulfate  325 mg Oral TID  . furosemide  40 mg Oral Daily  . hydrALAZINE  50 mg Oral Q8H  . insulin aspart  0-9 Units Subcutaneous Q4H  . insulin glargine  30 Units Subcutaneous QHS  . [START ON 10/04/2013] isosorbide mononitrate  30 mg Oral Daily  . lip balm  1 application Topical BID  . loperamide  4 mg Oral QID  . neomycin-bacitracin-polymyxin   Topical BID  . OxyCODONE  15 mg Oral Q12H  . potassium chloride  40 mEq Oral Daily  . sodium chloride  3 mL Intravenous Q12H  . zolpidem  10 mg Oral QHS   Continuous Infusions: . sodium chloride 20 mL/hr at 10/02/13 0600  . [name] KitchenTPN (CLINIMIX-E) Adult 125 mL/hr at 10/03/13 1740   And  . fat emulsion 250 mL (10/03/13 1740)   Hosie Poisson, MD  [facility] Pager [phone]  If  7PM-7AM, please contact night-coverage [email or web address] Password [facility] 10/03/2013, 7:36 PM   LOS: 53 days

## 2013-10-03 NOTE — Progress Notes (Signed)
PARENTERAL NUTRITION CONSULT NOTE - FOLLOW UP  Pharmacy Consult for TNA Indication: Enterocutaneous fistula  No Known Allergies  Patient Measurements: Height: 6' (182.9 cm) Weight: 336 lb 10.3 oz (152.7 kg) IBW/kg (Calculated) : 77.6 Adjusted Body Weight: 102.6kg  Vital Signs: Temp: 97.8 F (36.6 C) (12/28 0645) Temp src: Oral (12/28 0645) BP: 187/78 mmHg (12/28 0645) Pulse Rate: 83 (12/28 0645) Intake/Output from previous day: 12/27 0701 - 12/28 0700 In: 10905 [P.O.:30; I.V.:480; IV Piggyback:2000; TPN:8235] Out: 4200 [Urine:3950; Drains:250]  Labs:  Recent Labs  09/30/13 1130  WBC 7.8  HGB 9.8*  HCT 29.6*  PLT 266   No results found for this basename: NA, K, CL, CO2, GLUCOSE, BUN, CREATININE, LABCREA, CREAT24HRUR, CALCIUM, MG, PHOS, PROT, ALBUMIN, AST, ALT, ALKPHOS, BILITOT, BILIDIR, IBILI, PREALBUMIN, TRIG, CHOLHDL, CHOL,  in the last 72 hours  Recent Labs  10/02/13 1945 10/03/13 0016 10/03/13 0339  GLUCAP 136* 147* 125*   Insulin Requirements in the past 24 hours:   CBGs < 150, required 8 units sensitive SSI q4h  TNA contains regular insulin 50 units per 3L bag (16.6 units/L, increased on 12/24)  Nutritional Goals:   Goals per RD 12/23:  [id number] kcal/day, 160-180 grams/day protein, 3.3-3.5 L/day fluid.  Clinimix E5/15 at goal rate 125 ml/hr (3L/day) with 20% lipids at 10 ml/hr to provide 150g protein and 2675 kcal daily.  Due to how much fluid can be placed in TNA bag, maximum volume is 3L.  This rate will meet >/= 90% of estimated needs.  Current Nutrition:   NPO except for ice chips, sips with meds  Clinimix E5/15 at 180ml/hr and 20% lipids at 10 ml/hr over 24 hours  IVF: NS at [facility]: 62 yo male with large abdominal abscess with infected old Gore Tex mesh s/p I&D abscess, removal of mesh, debridement of abdominal wall and peritoneum. 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 TNA started 11/12 at 10pm.  Patient currently unable  to be discharged to a facility nearby that can accommodate both TNA and drainage system.   On 12/21, [facility] requested re-trying cyclic TNA, but because there is no advantage for discharge planning and pt has increased risks of fluid overload and hypoglycemia, we will continue with 24 hr continuous TPN.  12/28:  Day #46 TNA. Pt remains NPO and tolerating 24 hour continuous TPN. Abx changed to Cipro. Patient needs LTACH at discharge, awaiting bed availability at [facility] as patient refusing to got to [facility] in [place]. No significant changes.    Labs: No new labs 12/28  Glucose: Hx DM on insulin PTA. CBGs controlled. Regular insulin in TPN increased on 12/24.     Electrolytes: WNL 12/25 except Na slightly low (unable to adjust in Clinimix)  Renal:  SCr WNL, stable 12/25    LFTs: slightly increased on 12/25: AST/ALT 72/86, TBili 1.3   TGs: WNL (12/22)  Prealbumin improving, now WNL: 10.3 (11/13), 11.1 (11/17), 11.2(11/24), 13.6 (12/1), 10.3 (12/8), 13.4 (12/15), 15.4 (12/22)  Plan:   Continue Clinimix E 5/15 at 125 ml/hr with lipids 20% at 10 ml/hr over 24 hours.     Continue insulin 50 units per 3L bag (16.6 units/L)  TNA to contain standard multivitamins and trace elements.  Continue CBG checks q4h with sensitive SSI coverage.     Continue Lantus 30 units QHS.  KCl 89mEq PO daily per MD  Continue NS at [place] on Mondays & Thursdays.   F/u discharge plans and TNA arrangement.  Vanessa Everton, PharmD, BCPS Pager: [phone] 10:03 AM Pharmacy #: [phone]

## 2013-10-03 NOTE — Progress Notes (Signed)
Patient ID: Kurt Cross, male   DOB: [birth year], 62 y.o.   MRN: [id number]  General Surgery - [facility] - Progress Note  Subjective: Patient awake and alert.  No complaints.  Nurse in room.  Objective: Vital signs in last 24 hours: Temp:  [97.4 F (36.3 C)-97.8 F (36.6 C)] 97.8 F (36.6 C) (12/28 0645) Pulse Rate:  [63-83] 83 (12/28 0645) Resp:  [18] 18 (12/28 0645) BP: (138-187)/(75-86) 187/78 mmHg (12/28 0645) SpO2:  [97 %-99 %] 97 % (12/28 0645) Last BM Date: 10/02/13  Intake/Output from previous day: 12/27 0701 - 12/28 0700 In: 10905 [P.O.:30; I.V.:480; IV Piggyback:2000; TPN:8235] Out: 4200 [Urine:3950; Drains:250]  Exam: HEENT - clear, not icteric Neck - soft Chest - clear bilaterally Cor - RRR, no murmur Abd - wound dressing intact with thin succus in cannister; no leaks Ext - no significant edema Neuro - grossly intact, no focal deficits  Lab Results:   Recent Labs  09/30/13 1130  WBC 7.8  HGB 9.8*  HCT 29.6*  PLT 266    No results found for this basename: NA, K, CL, CO2, GLUCOSE, BUN, CREATININE, CALCIUM,  in the last 72 hours  Studies/Results: Dg Chest 1 View  10/02/2013   CLINICAL DATA:  Bedside PICC placement.  EXAM: PORTABLE CHEST - 1 VIEW  COMPARISON:  Two-view chest x-[place] 09/28/2013.  FINDINGS: Right arm PICC tip projects over the lower SVC at or near the cavoatrial junction. Cardiac silhouette enlarged but stable. Mild pulmonary venous hypertension without overt edema. Lungs clear. No visible pleural effusions. Left subclavian biventricular transvenous pacemaker unchanged.  IMPRESSION: 1. Right arm PICC tip projects over the lower SVC at or near the cavoatrial junction. 2. Stable cardiomegaly. Pulmonary venous hypertension without overt edema. No acute cardiopulmonary disease.   Electronically Signed   By: Evangeline Dakin M.D.   On: 10/02/2013 16:54    Assessment / Plan:  1. Infected prosthetic mesh of abdominal wall with  GIANT abscess s/p removal 08/14/2013  2. 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  3. Sepsis Resolved   Continue wound care with assistance of Wound/Ostomy team. Continue NPO with TNA.  Earnstine Regal, MD, [facility] Office: [phone]  10/03/2013

## 2013-10-04 LAB — DIFFERENTIAL
Basophils Relative: 0 % (ref 0–1)
Eosinophils Relative: 4 % (ref 0–5)
Lymphocytes Relative: 29 % (ref 12–46)
Monocytes Absolute: 1 10*3/uL (ref 0.1–1.0)
Monocytes Relative: 12 % (ref 3–12)
Neutro Abs: 4.4 10*3/uL (ref 1.7–7.7)
Neutrophils Relative %: 55 % (ref 43–77)

## 2013-10-04 LAB — GLUCOSE, CAPILLARY
Glucose-Capillary: 146 mg/dL — ABNORMAL HIGH (ref 70–99)
Glucose-Capillary: 143 mg/dL — ABNORMAL HIGH (ref 70–99)
Glucose-Capillary: 169 mg/dL — ABNORMAL HIGH (ref 70–99)
Glucose-Capillary: 174 mg/dL — ABNORMAL HIGH (ref 70–99)

## 2013-10-04 LAB — CBC
Hemoglobin: 9.5 g/dL — ABNORMAL LOW (ref 13.0–17.0)
MCHC: 32.9 g/dL (ref 30.0–36.0)
MCV: 92.3 fL (ref 78.0–100.0)
RBC: 3.13 MIL/uL — ABNORMAL LOW (ref 4.22–5.81)
WBC: 8 10*3/uL (ref 4.0–10.5)

## 2013-10-04 LAB — COMPREHENSIVE METABOLIC PANEL
ALT: 56 U/L — ABNORMAL HIGH (ref 0–53)
AST: 38 U/L — ABNORMAL HIGH (ref 0–37)
Albumin: 2.2 g/dL — ABNORMAL LOW (ref 3.5–5.2)
CO2: 27 mEq/L (ref 19–32)
Chloride: 100 mEq/L (ref 96–112)
Creatinine, Ser: 0.8 mg/dL (ref 0.50–1.35)
GFR calc non Af Amer: >90 mL/min (ref >90)
Potassium: 3.7 mEq/L (ref 3.5–5.1)
Sodium: 136 mEq/L (ref 135–145)
Total Bilirubin: 0.9 mg/dL (ref 0.3–1.2)
Total Protein: 7.1 g/dL (ref 6.0–8.3)

## 2013-10-04 LAB — PREALBUMIN: Prealbumin: 14.4 mg/dL — ABNORMAL LOW (ref 17.0–34.0)

## 2013-10-04 LAB — PHOSPHORUS: Phosphorus: 4.1 mg/dL (ref 2.3–4.6)

## 2013-10-04 LAB — MAGNESIUM: Magnesium: 1.6 mg/dL (ref 1.5–2.5)

## 2013-10-04 MED ORDER — TRACE MINERALS CR-CU-F-FE-I-MN-MO-SE-ZN IV SOLN
INTRAVENOUS | Status: AC
  Administered 2013-10-04: 17:00:00 via INTRAVENOUS
  Filled 2013-10-04: qty 3000

## 2013-10-04 MED ORDER — ALTEPLASE 2 MG IJ SOLR
2.0000 mg | Freq: Once | INTRAMUSCULAR | Status: AC
  Administered 2013-10-04: 2 mg
  Filled 2013-10-04: qty 2

## 2013-10-04 MED ORDER — FAT EMULSION 20 % IV EMUL
250.0000 mL | INTRAVENOUS | Status: AC
  Administered 2013-10-04: 250 mL via INTRAVENOUS
  Filled 2013-10-04: qty 250

## 2013-10-04 NOTE — Progress Notes (Signed)
ATTENDING ADDENDUM:  I personally reviewed patient's record, examined the patient, and formulated the following assessment and plan:  No new changes.  Cont TPN

## 2013-10-04 NOTE — Progress Notes (Signed)
Patient ID: Kurt Cross, male   DOB: 08-Apr-1951, 62 y.o.   MRN: [id number]  Subjective: No changes overnight.  He is NPO.  On TPN.  Afebrile.  BP high.  Objective:  Vital signs:  Filed Vitals:   10/03/13 1500 10/03/13 2110 10/04/13 0639 10/04/13 0820  BP: 159/85 158/91 164/97 173/81  Pulse: 65 65 70 84  Temp: 98.1 F (36.7 C) 98 F (36.7 C) 98.7 F (37.1 C)   TempSrc: Oral Oral Oral   Resp: 18 18 18    Height:      Weight:      SpO2: 95% 95% 93%     Last BM Date: 10/02/13  Intake/Output   Yesterday:  12/28 0701 - 12/29 0700 In: [id number] [P.O.:150; I.V.:490; TPN:3240] Out: 3600 [Urine:3350; Drains:250] This shift: I/O last 3 completed shifts: In: 6480 [P.O.:150; I.V.:730; Other:740] Out: [id number] [Urine:4850; Drains:500]    Physical Exam: General: Pt awake/alert/oriented x3 in no acute distress Abd: obese, soft, midline wound, fistula with brown drainage, 267ml/24h out.    Problem List:   Principal Problem:   Entero-atmospheric fistula Active Problems:   Diabetes mellitus, insulin dependent (IDDM), uncontrolled   HYPERLIPIDEMIA   Obesity, Class III, BMI 40-49.9 (morbid obesity)   OBSTRUCTIVE SLEEP APNEA   Hypertension   Acute-on-chronic respiratory failure secondary to probable community-acquired pneumonia   Constipation, chronic   Chronic combined systolic and diastolic CHF (congestive heart failure)   Right leg pain   AAA (abdominal aortic aneurysm)/ 4.5 cm ascending per CT angio 08/01/13   Infected prosthetic mesh of abdominal wall with GIANT abscess s/p removal 08/14/2013   Recurrent ventral incisional hernia   Acute renal failure (ARF)    Results:   Labs: Results for orders placed during the hospital encounter of 08/11/13 (from the past 48 hour(s))  GLUCOSE, CAPILLARY     Status: Abnormal   Collection Time    10/02/13 11:52 AM      Result Value Range   Glucose-Capillary 117 (*) 70 - 99 mg/dL  GLUCOSE, CAPILLARY     Status: Abnormal   Collection Time     10/02/13  4:08 PM      Result Value Range   Glucose-Capillary 153 (*) 70 - 99 mg/dL  GLUCOSE, CAPILLARY     Status: Abnormal   Collection Time    10/02/13  7:45 PM      Result Value Range   Glucose-Capillary 136 (*) 70 - 99 mg/dL  GLUCOSE, CAPILLARY     Status: Abnormal   Collection Time    10/03/13 12:16 AM      Result Value Range   Glucose-Capillary 147 (*) 70 - 99 mg/dL  GLUCOSE, CAPILLARY     Status: Abnormal   Collection Time    10/03/13  3:39 AM      Result Value Range   Glucose-Capillary 125 (*) 70 - 99 mg/dL  GLUCOSE, CAPILLARY     Status: Abnormal   Collection Time    10/03/13  7:48 AM      Result Value Range   Glucose-Capillary 140 (*) 70 - 99 mg/dL  GLUCOSE, CAPILLARY     Status: Abnormal   Collection Time    10/03/13 12:02 PM      Result Value Range   Glucose-Capillary 130 (*) 70 - 99 mg/dL  GLUCOSE, CAPILLARY     Status: Abnormal   Collection Time    10/03/13  4:29 PM      Result Value Range   Glucose-Capillary 151 (*) 70 -  99 mg/dL  GLUCOSE, CAPILLARY     Status: Abnormal   Collection Time    10/03/13  7:46 PM      Result Value Range   Glucose-Capillary 137 (*) 70 - 99 mg/dL  GLUCOSE, CAPILLARY     Status: Abnormal   Collection Time    10/03/13 11:23 PM      Result Value Range   Glucose-Capillary 138 (*) 70 - 99 mg/dL  GLUCOSE, CAPILLARY     Status: Abnormal   Collection Time    10/04/13  4:09 AM      Result Value Range   Glucose-Capillary 146 (*) 70 - 99 mg/dL  COMPREHENSIVE METABOLIC PANEL     Status: Abnormal   Collection Time    10/04/13  4:30 AM      Result Value Range   Sodium 136  135 - 145 mEq/L   Potassium 3.7  3.5 - 5.1 mEq/L   Chloride 100  96 - 112 mEq/L   CO2 27  19 - 32 mEq/L   Glucose, Bld 136 (*) 70 - 99 mg/dL   BUN 19  6 - 23 mg/dL   Creatinine, Ser 0.80  0.50 - 1.35 mg/dL   Calcium 9.0  8.4 - 10.5 mg/dL   Total Protein 7.1  6.0 - 8.3 g/dL   Albumin 2.2 (*) 3.5 - 5.2 g/dL   AST 38 (*) 0 - 37 U/L   ALT 56 (*) 0 - 53 U/L    Alkaline Phosphatase 81  39 - 117 U/L   Total Bilirubin 0.9  0.3 - 1.2 mg/dL   GFR calc non Af Amer >90  >90 mL/min   GFR calc Af Amer >90  >90 mL/min   Comment: (NOTE)     The eGFR has been calculated using the CKD EPI equation.     This calculation has not been validated in all clinical situations.     eGFR's persistently <90 mL/min signify possible Chronic Kidney     Disease.  MAGNESIUM     Status: None   Collection Time    10/04/13  4:30 AM      Result Value Range   Magnesium 1.6  1.5 - 2.5 mg/dL  PHOSPHORUS     Status: None   Collection Time    10/04/13  4:30 AM      Result Value Range   Phosphorus 4.1  2.3 - 4.6 mg/dL  CBC     Status: Abnormal   Collection Time    10/04/13  4:30 AM      Result Value Range   WBC 8.0  4.0 - 10.5 K/uL   RBC 3.13 (*) 4.22 - 5.81 MIL/uL   Hemoglobin 9.5 (*) 13.0 - 17.0 g/dL   HCT 28.9 (*) 39.0 - 52.0 %   MCV 92.3  78.0 - 100.0 fL   MCH 30.4  26.0 - 34.0 pg   MCHC 32.9  30.0 - 36.0 g/dL   RDW 14.4  11.5 - 15.5 %   Platelets 265  150 - 400 K/uL  DIFFERENTIAL     Status: None   Collection Time    10/04/13  4:30 AM      Result Value Range   Neutrophils Relative % 55  43 - 77 %   Neutro Abs 4.4  1.7 - 7.7 K/uL   Lymphocytes Relative 29  12 - 46 %   Lymphs Abs 2.3  0.7 - 4.0 K/uL   Monocytes Relative 12  3 - 12 %  Monocytes Absolute 1.0  0.1 - 1.0 K/uL   Eosinophils Relative 4  0 - 5 %   Eosinophils Absolute 0.3  0.0 - 0.7 K/uL   Basophils Relative 0  0 - 1 %   Basophils Absolute 0.0  0.0 - 0.1 K/uL  TRIGLYCERIDES     Status: None   Collection Time    10/04/13  4:30 AM      Result Value Range   Triglycerides 91  <150 mg/dL   Comment: Performed at [facility], [facility]     Status: Abnormal   Collection Time    10/04/13  7:47 AM      Result Value Range   Glucose-Capillary 143 (*) 70 - 99 mg/dL    Imaging / Studies: Dg Chest 1 View  10/02/2013   CLINICAL DATA:  Bedside PICC placement.  EXAM: PORTABLE CHEST -  1 VIEW  COMPARISON:  Two-view chest x-[place] 09/28/2013.  FINDINGS: Right arm PICC tip projects over the lower SVC at or near the cavoatrial junction. Cardiac silhouette enlarged but stable. Mild pulmonary venous hypertension without overt edema. Lungs clear. No visible pleural effusions. Left subclavian biventricular transvenous pacemaker unchanged.  IMPRESSION: 1. Right arm PICC tip projects over the lower SVC at or near the cavoatrial junction. 2. Stable cardiomegaly. Pulmonary venous hypertension without overt edema. No acute cardiopulmonary disease.   Electronically Signed   By: Evangeline Dakin M.D.   On: 10/02/2013 16:54    Medications / Allergies: per chart  Antibiotics: Anti-infectives   Start     Dose/Rate Route Frequency Ordered Stop   10/02/13 1600  ciprofloxacin (CIPRO) tablet 500 mg     500 mg Oral 2 times daily 10/02/13 1340     09/29/13 0000  vancomycin (VANCOCIN) 1,500 mg in sodium chloride 0.9 % 500 mL IVPB  Status:  Discontinued     1,500 mg 250 mL/hr over 120 Minutes Intravenous Every 12 hours 09/28/13 1115 10/02/13 1339   09/28/13 1200  piperacillin-tazobactam (ZOSYN) IVPB 3.375 g  Status:  Discontinued     3.375 g 12.5 mL/hr over 240 Minutes Intravenous 3 times per day 09/28/13 1113 10/02/13 1339   09/28/13 1200  vancomycin (VANCOCIN) 2,500 mg in sodium chloride 0.9 % 500 mL IVPB     2,500 mg 250 mL/hr over 120 Minutes Intravenous  Once 09/28/13 1115 09/28/13 1412   08/31/13 0800  vancomycin (VANCOCIN) 1,750 mg in sodium chloride 0.9 % 500 mL IVPB  Status:  Discontinued     1,750 mg 250 mL/hr over 120 Minutes Intravenous Every 24 hours 08/31/13 0753 08/31/13 1417   08/16/13 0800  vancomycin (VANCOCIN) 2,000 mg in sodium chloride 0.9 % 500 mL IVPB  Status:  Discontinued     2,000 mg 250 mL/hr over 120 Minutes Intravenous Every 24 hours 08/15/13 1411 08/31/13 0753   08/15/13 1500  vancomycin (VANCOCIN) 2,000 mg in sodium chloride 0.9 % 500 mL IVPB     2,000 mg 250 mL/hr  over 120 Minutes Intravenous  Once 08/15/13 1411 08/15/13 1713   08/14/13 1600  piperacillin-tazobactam (ZOSYN) IVPB 3.375 g  Status:  Discontinued     3.375 g 12.5 mL/hr over 240 Minutes Intravenous Every 8 hours 08/14/13 1543 08/31/13 1417      Assessment/Plan  1. Infected prosthetic mesh of abdominal wall with GIANT abscess s/p removal 08/14/2013 Dr. Michael Boston 2. Entero-atmospheric fistula-on bowel rest, Sandostatin stopped.  On TPN; Ongoing fistula output .  3. Sepsis-resolved  4. Afib on BB  and chronic anticoagulation with Apixaban (Eliquis)  5. Hx of chronic combined diastolic and systolic CHF-stable  6. Diabetes mellitus-on SSI, lantus 7. Stage III chronic renal disease-stable 8. Hypertension-management per IM 9. Hyperlipidemia  10. Obesity, Class III, BMI 40-49.9 (morbid obesity)  11. 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 Deconditioning-PT  Plan: check with nursing when pouch was last changed, would like to examine the wound with WOC. Also output is down from previous day 800-->219ml/24h.  Ensure that the suction is working properly. Continue with suction(unable to find facility who can provide and therefore the patient remains inpatient).     Erby Pian, [facility] Surgery Pager [phone] Office [phone]  10/04/2013 10:27 AM   .

## 2013-10-04 NOTE — Progress Notes (Signed)
RT placed patient on CPAP with 3LNC bled in. Patient tolerating well at this time.

## 2013-10-04 NOTE — Progress Notes (Signed)
Patient ID: Kurt Cross, male   DOB: 28-Jun-1951, 62 y.o.   MRN: [id number] [facility] PROGRESS NOTE  PRINCE CASTIGLIONI [id number] DOB: 1951-03-18 DOA: 08/11/2013 PCP: [name], MD   Interim Summary                                                                  Patient is a pleasant 62 year old gentleman with a past medical history of chronic systolic congestive heart failure, status post defibrillator placement, obesity, obstructive sleep apnea, type 2 diabetes mellitus, who was admitted to the medicine service on 08/11/2013. Prior to this he had been recently discharged on 08/05/2013 after being hospitalized for community-acquired pneumonia. He presented with complaints of right leg pain and inability to ambulate. Initial imaging studies of right hip were negative. He was found to be febrile, with leukocytosis for which a CT scan of abdomen and pelvis was performed on 08/14/2013 showing a 14.5 x 18.1 x 26.1 cm fluid collection/abscess within the anterior abdominal wall. Findings were consistent for infected mesh, general surgery was consulted immediately. He was taken to the OR on 08/14/2013 undergoing incision and drainage of abscess with Gore-Tex mesh removal. Patient also had debridement of abdominal wall and peritoneum. Seizure was performed by Dr. Michael Boston of general surgery. Postprocedure patient developed hypotension that responded to aggressive IV fluid resuscitation. Patient was felt to be septic as pulmonary critical care medicine was consulted. Patient up to this point had been on broad-spectrum empiric IV antibiotic therapy with vancomycin and Zosyn. On 08/18/2013, patient was noted to have large drainage feculent in the wound bed of abdomen. 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 He was made n.p.o., with PICC line placement and started on TPN. Eakin's pouch was placed on a low suction to prevent pooling of enteric contents into wound. He  remains n.p.o. on TPN. Attempts to get him placed to local LTAC have been unsuccessful. General surgery is following. On 09/28/2013  he developed a temperature 101.8. Patient overnight had reported having some cough with green sputum production. I ordered a chest x-[place] which showed no active cardiopulmonary disease. CBC showed a white count of 8500 . A urinalysis showed positive nitrates and leukocyte esterase with rare bacteria. Lactic acid was within normal range at 0.8. I discussed case with general surgery, it was not felt that this represented infection of his abdominal wall. Given his extensive history and complicated hospital course he was started on empirically with IV vancomycin and Zosyn, . Blood cultures were ordered yesterday, but patient refused them initially. We persuaded the patient to agree for blood cultures.ONE SET of blood cultures done and have been negative for 72 hours. The broad spectrum antibiotics were stopped and cipro was added to complete the course for possible UTI.  Assessment/Plan  Febrile Illness  -Could be secondary to viral infection given negative chest x-[place]. CBC showed a white count of 8500 with a lactate within normal limits. I recommended obtaining blood cultures however patient refusing. Urinalysis did reveal nitrates and leukocyte esterase. It is also conceivable this could be related to underlying urinary tract infection. He was started on empiric IV antibiotic therapy today with vancomycin and zosyn. HIS cultures have been neg for 48  hours, will start transitioning to oral antibiotic therapy today.  .   Infected prosthetic gore-tex mesh  - with large abscess s/p I&D and removal of gore-tex on 11/8. 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  - Continue TNA   - Continuous wall suction - pt will remain in the hospital for now as unable to go to LTAC  Sepsis  - due to coag neg staph s/p 2 weeks of vancomycin,  resolved HTN  - blood pressure stable  - cont coreg and lasix, hydralazine, imdur  #4 atrial fibrillation/atrial flutter  - secondary to problem #1. Now ressolved, CE x 3 neg, 2 D ECHO EF 55%  - currently rate controlled on Coreg  - CHADSVASc score is 4 (not ASVD plaque on abd aorta), resumed eliquis  #5 chronic kidney disease stage III  - stable, Cr WNL  #6 leukocytosis/fever  - resolved.  #7 chronic combined systolic and diastolic CHF  - weight stable around 150 kg or 330lbs  - clinically compensated  OSA,  - stable. Continue CPAP.  #8 Uncontrolled Pain  - continue prn oxycodone as needed  #9 hyperlipidemia  - Continue statin.  #10 4.5 cm ascending aortic aneurysm per CT scan of 08/01/2013  - Stable. Asymptomatic.  #11 type 2 diabetes  - continue Lantus and SSI  #12. Hypomagnesemia  - supplemented by TPN  Superficial Fungal Infection -Start Clotrimazole cream  Diet: NPO with ice chips  Access: TL PICC  IVF: TPN  Proph: apixaban   Code Status: full  Family Communication: patient alone  Disposition Plan: Patient is not a good candidate for SNF due to complexity of care, including need for continuous TPN and continuous wall suction. These interventions can easily be managed by an LTACH. The patient is otherwise stable but no bed available at [facility] and pt refusing to go to [facility] in [place].    Antibiotics  Vancomycin 08/15/13>>> 08/31/13  Zosyn 08/14/13>>>08/31/13  Procedures/Studies:  Ct Abdomen Pelvis Wo Contrast 08/14/2013 14.5 x 18.1 x 26.1 cm fluid collection/abscess within the anterior abdominal wall, as described above, incompletely visualized. The collection appears just anterior to prior ventral hernia mesh. Given the size and proximity to the skin surface, incision and drainage is suggested.  Dg Chest 2 View 08/01/2013 No active cardiopulmonary disease.  Dg Hip Complete Right 08/11/2013 No acute osseous injury of the right hip.  Ct Angio Chest Pe W/cm &/or Wo  Cm 08/01/2013 4.5 cm AAA without complicating features. No evidence of acute pulmonary embolism or aortic dissection.  Dg Chest Port 1 View 08/18/2013 Right upper extremity PICC terminates in the mid superior vena cava. Stable congestive heart failure pattern.  Dg Chest Port 1 View 08/17/2013 Stable chest with changes of congestive heart failure with pulmonary interstitial edema. Cardiac pacer in stable position.  Dg Chest Portable 1 View 08/15/2013 Stable cardiomegaly with interval worsening of pulmonary vascular congestion/ edema relative to 08/13/2013.  Dg Chest Port 1 View 08/13/2013 No acute findings. Stable cardiomegaly and pulmonary venous prominence without overt edema.  2-D echo 08/12/2013  Incision and drainage of abscess, Gore-Tex mesh removal, debridement of abdominal wall and peritoneum Consultants:  Cardiology: Dr. Terrence Dupont 08/13/2013 - signed off  General surgery:  ID: Dr Megan Salon 08/15/13 -- signed off   HPI/Subjective: No events overnight.  denies any chills, no chest pain, sob, or abdominal pain. No nausea or vomiting.  Objective: Filed Vitals:   10/04/13 0820 10/04/13 1347 10/04/13 1400 10/04/13 1733  BP: 173/81 145/84  170/86 149/71  Pulse: 84  76 62  Temp:   98.7 F (37.1 C)   TempSrc:   Oral   Resp:   18   Height:      Weight:      SpO2:   96%     Intake/Output Summary (Last 24 hours) at 10/04/13 1910 Last data filed at 10/04/13 1800  Gross per 24 hour  Intake   4560 ml  Output   2375 ml  Net   2185 ml    Exam:   General:  Pt is alert, follows commands appropriately, not in acute distress  Cardiovascular: Regular rate and rhythm, S1/S2, no murmurs, no rubs, no gallops  Respiratory: Clear to auscultation bilaterally, no wheezing, no crackles, no rhonchi  Abdomen: Soft, non tender, non distended, open abdominal wound with drainage   Extremities: No edema, pulses DP and PT palpable bilaterally  Neuro: Grossly nonfocal  Data Reviewed: Basic Metabolic  Panel:  Recent Labs Lab 09/28/13 0705 09/29/13 0515 09/30/13 0508 10/04/13 0430  NA 133* 133* 133* 136  K 4.1 3.6 3.8 3.7  CL 99 100 99 100  CO2 24 26 23 27   GLUCOSE 133* 182* 167* 136*  BUN 26* 24* 21 19  CREATININE 1.00 1.07 1.00 0.80  CALCIUM 8.8 8.7 8.5 9.0  MG  --   --  1.6 1.6  PHOS 4.5  --  4.2 4.1   Liver Function Tests:  Recent Labs Lab 09/30/13 0508 10/04/13 0430  AST 72* 38*  ALT 86* 56*  ALKPHOS 79 81  BILITOT 1.3* 0.9  PROT 7.0 7.1  ALBUMIN 2.1* 2.2*   CBC:  Recent Labs Lab 09/29/13 0515 09/30/13 1130 10/04/13 0430  WBC 10.2 7.8 8.0  NEUTROABS  --   --  4.4  HGB 9.5* 9.8* 9.5*  HCT 28.4* 29.6* 28.9*  MCV 92.5 92.2 92.3  PLT 253 266 265   CBG:  Recent Labs Lab 10/03/13 2323 10/04/13 0409 10/04/13 0747 10/04/13 1153 10/04/13 1616  GLUCAP 138* 146* 143* 141* 169*    Scheduled Meds: . acetaminophen  1,000 mg Oral TID  . antiseptic oral rinse  15 mL Mouth Rinse q12n4p  . apixaban  5 mg Oral BID  . atorvastatin  40 mg Oral QODAY  . carvedilol  3.125 mg Oral BID WC  . ciprofloxacin  500 mg Oral BID  . clotrimazole   Topical BID  . ferrous sulfate  325 mg Oral TID  . furosemide  40 mg Oral Daily  . hydrALAZINE  50 mg Oral Q8H  . insulin aspart  0-9 Units Subcutaneous Q4H  . insulin glargine  30 Units Subcutaneous QHS  . isosorbide mononitrate  30 mg Oral Daily  . lip balm  1 application Topical BID  . loperamide  4 mg Oral QID  . neomycin-bacitracin-polymyxin   Topical BID  . OxyCODONE  15 mg Oral Q12H  . potassium chloride  40 mEq Oral Daily  . sodium chloride  3 mL Intravenous Q12H  . zolpidem  10 mg Oral QHS   Continuous Infusions: . sodium chloride 20 mL/hr at 10/04/13 1800  . [name] KitchenTPN (CLINIMIX-E) Adult 125 mL/hr at 10/04/13 1800   And  . fat emulsion 250 mL (10/04/13 1800)   Hosie Poisson, MD  [facility] Pager [phone]  If 7PM-7AM, please contact night-coverage [email or web address] Password [email or web address] 10/04/2013, 7:10 PM   LOS: 54 days

## 2013-10-04 NOTE — Consult Note (Signed)
WOC wound follow up Wound type:Open abdomen with stomatized enteric fistula at 5 o'clock Measurement:16cm x 10cm x 1.5cm with undermined areas slightly changed from previous assessment. 12 o'clock = 3.5cm  3 o'clock = 0cm  4 o'clock = 3.5cm  5 o'clock = 5cm  6 o'clock = 2.5cm  7 o'clock = 3cm 7-10 o'clock wound edges are closed Wound bed:90% clean, red granulation tissue, 10% adherent yellow slough Drainage (amount, consistency, odor) Thickening green/brown effluent.  Some discussion that either catheter was clogged or that suction cannister was not working properly just prior to pouch change today.  New catheter ([place] #[id number]) and cannister (tubing, etc.) provided today. Periwound:intact, clear. Dressing procedure/placement/frequency:Large Eakin pouch applied with spout directed toward right side after first dressing site of previous colostomy with a 2x2 inch damp saline dressing.  This area has nearly granulated in and reepithelialized.  A single piece of silicone wound contact layer is applied to the distal half of the wound with a "cutout" performed for the fistula after cleansing the wound with saline; fistula now measures slightly larger than originally, at 1 and 1/4 inches round. The catheter is fed through the top of the pouch and suction applied at 67mmHg.  Patient tolerated procedure well and even assists in small ways (tearing tape, etc.).  Pouching system appears to now hold for 4-5 days. CCS would like to view wound with next pouching system change.  I will advise my partners as I will be away from the hospital at the end of this week. [facility] nursing team will continue to follow, and will remain available to this patient, the nursing and medical team.  Please re-consult if needed between visits. Thanks, Maudie Flakes, MSN, RN, [place], [place], CWON-AP [phone])  Conservative sharp wound debridement (CSWD performed at the bedside):

## 2013-10-04 NOTE — Progress Notes (Signed)
RT placed patient on CPAP. Water chamber filled for humidification.Patient tolerating well at this time.

## 2013-10-04 NOTE — Progress Notes (Addendum)
Physical Therapy Treatment/goal update Patient Details Name: Kurt Cross MRN: [id number] DOB: 05-30-1951 Today's Date: 10/04/2013 Time: 1111-1130 PT Time Calculation (min): 19 min  PT Assessment / Plan / Recommendation  History of Present Illness Kurt Cross is a 62 y.o. male  with known history of chronic systolic heart failure status post defibrillator placement last EF measured was in 2013 was 35-40%, on home oxygen, OSA noncompliant with CPAP, diabetes mellitus, hypertension and hyperlipidemia presented to the ER because right leg pain after sustaining a fall the night prior to admission.  08/14/13: s/p abdominal abscess removal   PT Comments   Pt continues to require 2 persons for safety in order to work on increasing distance for ambulation. Pt will benefit from PT to continue to address problems.  Follow Up Recommendations  SNF     Does the patient have the potential to tolerate intense rehabilitation     Barriers to Discharge        Equipment Recommendations  None recommended by PT    Recommendations for Other Services    Frequency Min 2X/week   Progress towards PT Goals Progress towards PT goals: Goals  updated - see care plan  Plan Current plan remains appropriate    Precautions / Restrictions Precautions Precautions: Fall Precaution Comments: ostomy bag on R side and wall suction to ABD Restrictions Weight Bearing Restrictions: No   Pertinent Vitals/Pain No c/o    Mobility  Bed Mobility Right Sidelying to Sit: 3: Mod assist;HOB elevated Left Sidelying to Sit:  (trapeze) Sitting - Scoot to Edge of Bed: 6: Modified independent (Device/Increase time) Transfers Sit to Stand: 5: Supervision;From bed;4: Min assist (from rolling chair in hallway.) Stand Pivot Transfers: 4: Min assist Details for Transfer Assistance: pt had urgency to sit down, Recliner was not  available so sat in chair in hall. Ambulation/Gait Ambulation/Gait Assistance: 1: +2 Total assist (+2  for safety as pt has to sit down.) Ambulation/Gait: Patient Percentage: 80% Ambulation Distance (Feet): 80 Feet (x2) Assistive device: Rolling walker Ambulation/Gait Assistance Details: increased time. Gait Pattern: Step-through pattern;Wide base of support;Decreased stride length;Ataxic Gait velocity: decreased    Exercises     PT Diagnosis:    PT Problem List:   PT Treatment Interventions:     PT Goals (current goals can now be found in the care plan section)    Visit Information  Last PT Received On: 10/04/13 Assistance Needed: +2 History of Present Illness: Kurt Cross is a 62 y.o. male  with known history of chronic systolic heart failure status post defibrillator placement last EF measured was in 2013 was 35-40%, on home oxygen, OSA noncompliant with CPAP, diabetes mellitus, hypertension and hyperlipidemia presented to the ER because right leg pain after sustaining a fall the night prior to admission.  08/14/13: s/p abdominal abscess removal    Subjective Data      Cognition  Cognition Arousal/Alertness: Awake/alert Behavior During Therapy: WFL for tasks assessed/performed Overall Cognitive Status: Within Functional Limits for tasks assessed    Balance     End of Session PT - End of Session Activity Tolerance: Patient tolerated treatment well Patient left: in chair;with call bell/phone within reach Nurse Communication: Mobility status   GP     Claretha Cooper 10/04/2013, 1:08 PM Tresa Endo PT [phone]

## 2013-10-04 NOTE — Progress Notes (Signed)
PARENTERAL NUTRITION CONSULT NOTE - FOLLOW UP  Pharmacy Consult for TNA Indication: Enterocutaneous fistula  No Known Allergies  Patient Measurements: Height: 6' (182.9 cm) Weight: 336 lb 10.3 oz (152.7 kg) IBW/kg (Calculated) : 77.6 Adjusted Body Weight: 102.6kg  Vital Signs: Temp: 98.7 F (37.1 C) (12/29 0639) Temp src: Oral (12/29 0639) BP: 173/81 mmHg (12/29 0820) Pulse Rate: 84 (12/29 0820) Intake/Output from previous day: 12/28 0701 - 12/29 0700 In: 4460 [P.O.:150; I.V.:490; TPN:3240] Out: 3600 [Urine:3350; Drains:250]  Labs:  Recent Labs  10/04/13 0430  WBC 8.0  HGB 9.5*  HCT 28.9*  PLT 265    Recent Labs  10/04/13 0430  NA 136  K 3.7  CL 100  CO2 27  GLUCOSE 136*  BUN 19  CREATININE 0.80  CALCIUM 9.0  MG 1.6  PHOS 4.1  PROT 7.1  ALBUMIN 2.2*  AST 38*  ALT 56*  ALKPHOS 81  BILITOT 0.9  TRIG 91  Calcium 10.4  Recent Labs  10/03/13 2323 10/04/13 0409 10/04/13 0747  GLUCAP 138* 146* 143*   Insulin Requirements in the past 24 hours:   CBGs < 150, required 6 units sensitive SSI q4h  TNA contains regular insulin 50 units per 3L bag (16.6 units/L, increased on 12/24)  Nutritional Goals:   Goals per RD 12/23:  [id number] kcal/day, 160-180 grams/day protein, 3.3-3.5 L/day fluid.  Clinimix E 5/15 at goal rate 125 ml/hr (3L/day) with 20% lipids at 10 ml/hr to provide 150g protein and 2675 kcal daily.  Due to how much fluid can be placed in TNA bag, maximum volume is 3L.  This rate will meet >/= 90% of estimated needs.  Current Nutrition:   NPO except for ice chips, sips with meds  Clinimix E5/15 at 156ml/hr and 20% lipids at 10 ml/hr over 24 hours  IVF: NS at [place]: 62 yo male with large abdominal abscess with infected old Gore Tex mesh s/p I&D abscess, removal of mesh, debridement of abdominal wall and peritoneum. 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 TNA started 11/12 at 10pm.  Patient currently unable to be  discharged to a facility nearby that can accommodate both TNA and drainage system.   On 12/21, [facility] requested re-trying cyclic TNA, but because there is no advantage for discharge planning and pt has increased risks of fluid overload and hypoglycemia, we will continue with 24 hr continuous TPN.  12/29:  Day #47 TNA. Pt remains NPO and tolerating 24 hour continuous TPN. Abx changed to Cipro. Patient needs LTACH at discharge, awaiting bed availability at [place] as patient refusing to got to [facility] in [place]. No significant changes.    Labs:   Glucose: Hx DM on insulin PTA. CBGs controlled. Regular insulin in TPN increased on 12/24.     Electrolytes: WNL   Renal:  SCr WNL, stable    LFTs: AST and ALT remain slightly elevated (<1.5 x ULN), but have improved.  Bilirubin has improved to WNL.  TGs: WNL   Prealbumin improving: 10.3 (11/13), 11.1 (11/17), 11.2(11/24), 13.6 (12/1), 10.3 (12/8), 13.4 (12/15), 15.4 (12/22), today's value pending  Plan:   Continue Clinimix E 5/15 at 125 ml/hr with lipids 20% at 10 ml/hr over 24 hours.     Continue insulin 50 units per 3L bag (16.6 units/L)  TNA to contain standard multivitamins and trace elements.  Continue CBG checks q4h with sensitive SSI coverage.     Continue Lantus 30 units QHS.  KCl 30mEq PO daily per MD  Continue  NS at [facility]  F/U on today's prealbumin value when result available  TNA lab panels on Mondays & Thursdays.   F/U discharge plans and TNA arrangement.  Clayburn Pert, PharmD, BCPS Pager: [phone] 10/04/2013  9:13 AM

## 2013-10-05 DIAGNOSIS — K632 Fistula of intestine: Secondary | ICD-10-CM

## 2013-10-05 LAB — GLUCOSE, CAPILLARY
Glucose-Capillary: 131 mg/dL — ABNORMAL HIGH (ref 70–99)
Glucose-Capillary: 164 mg/dL — ABNORMAL HIGH (ref 70–99)
Glucose-Capillary: 174 mg/dL — ABNORMAL HIGH (ref 70–99)
Glucose-Capillary: 177 mg/dL — ABNORMAL HIGH (ref 70–99)
Glucose-Capillary: 205 mg/dL — ABNORMAL HIGH (ref 70–99)
Glucose-Capillary: 216 mg/dL — ABNORMAL HIGH (ref 70–99)

## 2013-10-05 LAB — CULTURE, BLOOD (ROUTINE X 2)

## 2013-10-05 MED ORDER — FAT EMULSION 20 % IV EMUL
250.0000 mL | INTRAVENOUS | Status: AC
  Administered 2013-10-05: 250 mL via INTRAVENOUS
  Filled 2013-10-05: qty 250

## 2013-10-05 MED ORDER — TRACE MINERALS CR-CU-F-FE-I-MN-MO-SE-ZN IV SOLN
INTRAVENOUS | Status: AC
  Administered 2013-10-05: 17:00:00 via INTRAVENOUS
  Filled 2013-10-05: qty 3000

## 2013-10-05 MED ORDER — INSULIN ASPART 100 UNIT/ML ~~LOC~~ SOLN
2.0000 [IU] | Freq: Three times a day (TID) | SUBCUTANEOUS | Status: DC
  Administered 2013-10-05 – 2013-10-06 (×3): 2 [IU] via SUBCUTANEOUS

## 2013-10-05 MED ORDER — INSULIN ASPART 100 UNIT/ML ~~LOC~~ SOLN
0.0000 [IU] | SUBCUTANEOUS | Status: DC
  Administered 2013-10-05 (×2): 5 [IU] via SUBCUTANEOUS
  Administered 2013-10-05: 2 [IU] via SUBCUTANEOUS
  Administered 2013-10-05: 3 [IU] via SUBCUTANEOUS
  Administered 2013-10-06 – 2013-10-07 (×8): 2 [IU] via SUBCUTANEOUS
  Administered 2013-10-07: 3 [IU] via SUBCUTANEOUS
  Administered 2013-10-07 – 2013-10-08 (×2): 2 [IU] via SUBCUTANEOUS
  Administered 2013-10-08 (×2): 3 [IU] via SUBCUTANEOUS
  Administered 2013-10-08 – 2013-10-10 (×13): 2 [IU] via SUBCUTANEOUS
  Administered 2013-10-10: 3 [IU] via SUBCUTANEOUS
  Administered 2013-10-11: 2 [IU] via SUBCUTANEOUS
  Administered 2013-10-11 (×2): 3 [IU] via SUBCUTANEOUS
  Administered 2013-10-11: 2 [IU] via SUBCUTANEOUS
  Administered 2013-10-11 – 2013-10-12 (×3): 3 [IU] via SUBCUTANEOUS
  Administered 2013-10-12 – 2013-10-15 (×8): 2 [IU] via SUBCUTANEOUS

## 2013-10-05 MED ORDER — AMLODIPINE BESYLATE 5 MG PO TABS
5.0000 mg | ORAL_TABLET | Freq: Every day | ORAL | Status: DC
  Administered 2013-10-05 – 2013-10-06 (×2): 5 mg via ORAL
  Filled 2013-10-05 (×3): qty 1

## 2013-10-05 NOTE — Progress Notes (Signed)
CSW continues to follow pt to assist with d/c planning needs. PN reviewed. Pt remains medically unstable. Will continue to follow.  Werner Lean LCSW [phone]

## 2013-10-05 NOTE — Progress Notes (Signed)
Pt placed on CPAP via FFM with 3 LPM O2 bleed in.  Pt tolerating well at this time, RT to monitor and assess as needed

## 2013-10-05 NOTE — Progress Notes (Signed)
Patient ID: Kurt Cross, male   DOB: 02/26/1951, 62 y.o.   MRN: [id number] [facility] PROGRESS NOTE  Kurt TOPPI [id number] DOB: [birth year] DOA: 08/11/2013 PCP: [name], MD   Interim Summary                                                                  Patient is a pleasant 62 year old gentleman with a past medical history of chronic systolic congestive heart failure, status post defibrillator placement, obesity, obstructive sleep apnea, type 2 diabetes mellitus, who was admitted to the medicine service on 08/11/2013. Prior to this he had been recently discharged on 08/05/2013 after being hospitalized for community-acquired pneumonia. He presented with complaints of right leg pain and inability to ambulate. Initial imaging studies of right hip were negative. He was found to be febrile, with leukocytosis for which a CT scan of abdomen and pelvis was performed on 08/14/2013 showing a 14.5 x 18.1 x 26.1 cm fluid collection/abscess within the anterior abdominal wall. Findings were consistent for infected mesh, general surgery was consulted immediately. He was taken to the OR on 08/14/2013 undergoing incision and drainage of abscess with Gore-Tex mesh removal. Patient also had debridement of abdominal wall and peritoneum. Seizure was performed by Dr. Michael Boston of general surgery. Postprocedure patient developed hypotension that responded to aggressive IV fluid resuscitation. Patient was felt to be septic as pulmonary critical care medicine was consulted. Patient up to this point had been on broad-spectrum empiric IV antibiotic therapy with vancomycin and Zosyn. On 08/18/2013, patient was noted to have large drainage feculent in the wound bed of abdomen. 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 He was made n.p.o., with PICC line placement and started on TPN. Eakin's pouch was placed on a low suction to prevent pooling of enteric contents into wound. He  remains n.p.o. on TPN. Attempts to get him placed to local LTAC have been unsuccessful. General surgery is following. On 09/28/2013  he developed a temperature 101.8. Patient overnight had reported having some cough with green sputum production. I ordered a chest x-[place] which showed no active cardiopulmonary disease. CBC showed a white count of 8500 . A urinalysis showed positive nitrates and leukocyte esterase with rare bacteria. Lactic acid was within normal range at 0.8. I discussed case with general surgery, it was not felt that this represented infection of his abdominal wall. Given his extensive history and complicated hospital course he was started on empirically with IV vancomycin and Zosyn, . Blood cultures were ordered yesterday, but patient refused them initially. We persuaded the patient to agree for blood cultures.ONE SET of blood cultures done and have been negative for 72 hours. The broad spectrum antibiotics were stopped and cipro was added to complete the course for possible UTI.  Assessment/Plan  Febrile Illness  -Could be secondary to viral infection given negative chest x-[place]. CBC showed a white count of 8500 with a lactate within normal limits. I recommended obtaining blood cultures however patient refusing. Urinalysis did reveal nitrates and leukocyte esterase. It is also conceivable this could be related to underlying urinary tract infection. He was started on empiric IV antibiotic therapy today with vancomycin and zosyn. HIS cultures have been neg for 48  hours, will start transitioning to oral antibiotic therapy today.  .   Infected prosthetic gore-tex mesh  - with large abscess s/p I&D and removal of gore-tex on 11/8. 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  - Continue TNA   - Continuous wall suction - pt will remain in the hospital for now as unable to go to LTAC  Sepsis  - due to coag neg staph s/p 2 weeks of vancomycin,  resolved HTN  - blood pressure slightly high, added amlodipine.   - cont coreg and lasix, hydralazine, imdur  #4 atrial fibrillation/atrial flutter  - secondary to problem #1. Now ressolved, CE x 3 neg, 2 D ECHO EF 55%  - currently rate controlled on Coreg  - CHADSVASc score is 4 (not ASVD plaque on abd aorta), resumed eliquis  #5 chronic kidney disease stage III  - stable, Cr WNL  #6 leukocytosis/fever  - resolved.  #7 chronic combined systolic and diastolic CHF  - weight stable around 150 kg or 330lbs  - clinically compensated  OSA,  - stable. Continue CPAP.  #8 Uncontrolled Pain  - continue prn oxycodone as needed  #9 hyperlipidemia  - Continue statin.  #10 4.5 cm ascending aortic aneurysm per CT scan of 08/01/2013  - Stable. Asymptomatic.  #11 type 2 diabetes  - continue Lantus  - CBG (last 3)   Recent Labs  10/05/13 0350 10/05/13 0757 10/05/13 1217  GLUCAP 164* 177* 205*    Increased the SSI TO moderate scale.  Add 2 unts of novolog TIDAC.   #12. Hypomagnesemia  - supplemented by TPN  Superficial Fungal Infection -Start Clotrimazole cream  Diet: NPO with ice chips  Access: TL PICC  IVF: TPN  Proph: apixaban   Code Status: full  Family Communication: patient alone  Disposition Plan: Patient is not a good candidate for SNF due to complexity of care, including need for continuous TPN and continuous wall suction. These interventions can easily be managed by an LTACH. The patient is otherwise stable but no bed available at [facility] and pt refusing to go to [facility] in [place].    Antibiotics  Vancomycin 08/15/13>>> 08/31/13  Zosyn 08/14/13>>>08/31/13  Procedures/Studies:  Ct Abdomen Pelvis Wo Contrast 08/14/2013 14.5 x 18.1 x 26.1 cm fluid collection/abscess within the anterior abdominal wall, as described above, incompletely visualized. The collection appears just anterior to prior ventral hernia mesh. Given the size and proximity to the skin surface, incision  and drainage is suggested.  Dg Chest 2 View 08/01/2013 No active cardiopulmonary disease.  Dg Hip Complete Right 08/11/2013 No acute osseous injury of the right hip.  Ct Angio Chest Pe W/cm &/or Wo Cm 08/01/2013 4.5 cm AAA without complicating features. No evidence of acute pulmonary embolism or aortic dissection.  Dg Chest Port 1 View 08/18/2013 Right upper extremity PICC terminates in the mid superior vena cava. Stable congestive heart failure pattern.  Dg Chest Port 1 View 08/17/2013 Stable chest with changes of congestive heart failure with pulmonary interstitial edema. Cardiac pacer in stable position.  Dg Chest Portable 1 View 08/15/2013 Stable cardiomegaly with interval worsening of pulmonary vascular congestion/ edema relative to 08/13/2013.  Dg Chest Port 1 View 08/13/2013 No acute findings. Stable cardiomegaly and pulmonary venous prominence without overt edema.  2-D echo 08/12/2013  Incision and drainage of abscess, Gore-Tex mesh removal, debridement of abdominal wall and peritoneum Consultants:  Cardiology: Dr. Terrence Dupont 08/13/2013 - signed off  General surgery:  ID: Dr Megan Salon 08/15/13 -- signed  off   HPI/Subjective: No events overnight.  denies any chills, no chest pain, sob, or abdominal pain. No nausea or vomiting.  Objective: Filed Vitals:   10/05/13 0500 10/05/13 0515 10/05/13 0525 10/05/13 1400  BP:  166/82 166/82 166/77  Pulse:   73 76  Temp:   97.9 F (36.6 C) 98.4 F (36.9 C)  TempSrc:   Oral Oral  Resp:   18 18  Height:      Weight: 152.6 kg (336 lb 6.8 oz)     SpO2:   95% 99%    Intake/Output Summary (Last 24 hours) at 10/05/13 1606 Last data filed at 10/05/13 1400  Gross per 24 hour  Intake   2480 ml  Output   2825 ml  Net   -345 ml    Exam:   General:  Pt is alert, follows commands appropriately, not in acute distress  Cardiovascular: Regular rate and rhythm, S1/S2, no murmurs, no rubs, no gallops  Respiratory: Clear to auscultation bilaterally, no  wheezing, no crackles, no rhonchi  Abdomen: Soft, non tender, non distended, open abdominal wound with drainage   Extremities: No edema, pulses DP and PT palpable bilaterally  Neuro: Grossly nonfocal  Data Reviewed: Basic Metabolic Panel:  Recent Labs Lab 09/29/13 0515 09/30/13 0508 10/04/13 0430  NA 133* 133* 136  K 3.6 3.8 3.7  CL 100 99 100  CO2 26 23 27   GLUCOSE 182* 167* 136*  BUN 24* 21 19  CREATININE 1.07 1.00 0.80  CALCIUM 8.7 8.5 9.0  MG  --  1.6 1.6  PHOS  --  4.2 4.1   Liver Function Tests:  Recent Labs Lab 09/30/13 0508 10/04/13 0430  AST 72* 38*  ALT 86* 56*  ALKPHOS 79 81  BILITOT 1.3* 0.9  PROT 7.0 7.1  ALBUMIN 2.1* 2.2*   CBC:  Recent Labs Lab 09/29/13 0515 09/30/13 1130 10/04/13 0430  WBC 10.2 7.8 8.0  NEUTROABS  --   --  4.4  HGB 9.5* 9.8* 9.5*  HCT 28.4* 29.6* 28.9*  MCV 92.5 92.2 92.3  PLT 253 266 265   CBG:  Recent Labs Lab 10/04/13 1954 10/04/13 2350 10/05/13 0350 10/05/13 0757 10/05/13 1217  GLUCAP 174* 139* 164* 177* 205*    Scheduled Meds: . acetaminophen  1,000 mg Oral TID  . amLODipine  5 mg Oral Daily  . antiseptic oral rinse  15 mL Mouth Rinse q12n4p  . apixaban  5 mg Oral BID  . atorvastatin  40 mg Oral QODAY  . carvedilol  3.125 mg Oral BID WC  . ciprofloxacin  500 mg Oral BID  . clotrimazole   Topical BID  . ferrous sulfate  325 mg Oral TID  . furosemide  40 mg Oral Daily  . hydrALAZINE  50 mg Oral Q8H  . insulin aspart  0-15 Units Subcutaneous Q4H  . insulin glargine  30 Units Subcutaneous QHS  . isosorbide mononitrate  30 mg Oral Daily  . lip balm  1 application Topical BID  . loperamide  4 mg Oral QID  . neomycin-bacitracin-polymyxin   Topical BID  . OxyCODONE  15 mg Oral Q12H  . potassium chloride  40 mEq Oral Daily  . sodium chloride  3 mL Intravenous Q12H  . zolpidem  10 mg Oral QHS   Continuous Infusions: . sodium chloride 20 mL/hr at 10/04/13 1800  . [name] KitchenTPN (CLINIMIX-E) Adult 125 mL/hr at  10/04/13 1800   And  . fat emulsion 250 mL (10/04/13 1800)  . [name] Kitchen  TPN (CLINIMIX-E) Adult     And  . fat emulsion     Cavin Longman, MD  [facility] Pager [phone]  If 7PM-7AM, please contact night-coverage [email or web address] Password [email or web address] 10/05/2013, 4:06 PM   LOS: 55 days

## 2013-10-05 NOTE — Progress Notes (Signed)
NUTRITION FOLLOW UP  Intervention:   Continue TPN per Pharmacy RD to continue monitor  Nutrition Dx:   Inadequate oral intake related to EC fistula as evidenced by NPO status; ongoing  Goal:   Pt to meet >/= 90% of their estimated nutrition needs; being met by TPN  Monitor:   Weights, labs, TPN, fistula output   Assessment:   62 y.o. male with known history of chronic systolic heart failure status post defibrillator placement last EF measured was in 2013 was 35-40%, on home oxygen, OSA noncompliant with CPAP, diabetes mellitus, hypertension and hyperlipidemia. CT scan revealed a large abdominal wall abscess.  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 TNA started 11/12 at 10pm.  11/13 - Pt reports decreased appetite but, per nursing notes pt was eating 100% of meals before being made NPO. Pt states that he weighed 368 lbs prior to previous hospitalization  11/20 - Per MD note moderate output continues from wound. Clinimix 5/15 is running at goal rate of 136ml/hr with 20% lipids at 52ml/hr; provides 150g protein and 2675 kcal daily. This meets 100% of estimated energy needs and 94% of estimated protein needs. Pt's weight has decreased 9 lbs in the past week and 23 lbs since admission.   11/25 - Clinimix 5/15 running at goal rate of 125 ml/hr with 20% lipids at 33ml/hr; provides 150g protein and 2675 kcal daily. This meets 100% of estimated energy needs and 94% of estimated protein needs. Weight loss is slowing down with 3 lb weight loss in the past 5 days.   12/2 - TPN was changed to cyclic 95/6. Per Pharmacy note, cyclic 18 hour infusion of Clinimix E 5/15, starting at 66ml/hr x 1 hour, then 181 ml/hr x 16 hours, then 50 ml/hr x 1 hr, then off; with lipids 20% at 13 ml/hr x 18 hours. Per chart pt will likely be discharged with TNA. Weight stable over past week. Per chart pt had a hypoglycemic event today.  12/9 - TPN was changed back to 24 hours. Clinimix E5/15 at goal rate 125 ml/hr  (3L/day) with 20% lipids at 10 ml/hr to provide 150g protein and 2675 kcal daily. This meets 94% of estimated protein needs and 101% of estimated energy needs. Pt continues to lose weight but, slowly-2 lb weight loss in past 2 weeks.   12/16 - Clinimix E5/15 at goal rate 125 ml/hr (3L/day) with 20% lipids at 10 ml/hr to provide 150 g protein and 2675 kcal daily. This meets 94% of estimated protein needs and 101% of estimated energy needs. Pt continues to lose weight with 5 lb weight loss in past 2 weeks and 18 lb weight loss since 08/12/13. Pt has lost 2% of his body weight in the past month- not significant.  12/23 - Clinimix E5/15 at goal rate 125 ml/hr (3L/day) with 20% lipids at 10 ml/hr to provide 150 g protein and 2675 kcal daily. This meets 94% of estimated protein needs and 101% of estimated energy needs.Pt being transferred out of room at time of visit. Pt's weight is up today- ? accuracy vs edema. Per MD note 12/22- pt appears to be getting stronger, ambulating with his walker down to the hallway.   12/30 - Reviewed events since last RD note. WOC RN noted half-digested pill expelled from fistula 12/24. Fistula output down from 826ml total 12/26 to 262ml total 12/27. Was 280ml total 12/28 and 224ml total 12/29. Pt's weight down 4 pounds since admission. Pt on phone in room.  TPN: Clinimix E 5/15 @ 125 ml/hr and lipids @ 10 ml/hr. Provides 150g protein and 2675 kcal daily. Meets 100% minimum estimated energy needs and 94% minimum estimated protein needs.  CBGs elevated PALB low but relatively stable compared to last week  AST/ALT elevated but trending down Triglycerides WNL    Height: Ht Readings from Last 1 Encounters:  08/14/13 6' (1.829 m)    Weight Status:   Wt Readings from Last 1 Encounters:  10/05/13 336 lb 6.8 oz (152.6 kg)  Admit wt:        340 lb 9.8 oz (154.5 kg)  Re-estimated needs:  Kcal: 2370-2650 Protein: 160-180 grams Fluid: 3.3-3.5 L/day  Skin: +1 Generalized  edema, non-pitting RLE and LLE edema; abdominal incisions/wounds with closed system drains  Diet Order: NPO   Intake/Output Summary (Last 24 hours) at 10/05/13 1002 Last data filed at 10/05/13 0600  Gross per 24 hour  Intake   2620 ml  Output   1925 ml  Net    695 ml    Last BM: 12/27   Labs:   Recent Labs Lab 09/29/13 0515 09/30/13 0508 10/04/13 0430  NA 133* 133* 136  K 3.6 3.8 3.7  CL 100 99 100  CO2 26 23 27   BUN 24* 21 19  CREATININE 1.07 1.00 0.80  CALCIUM 8.7 8.5 9.0  MG  --  1.6 1.6  PHOS  --  4.2 4.1  GLUCOSE 182* 167* 136*    CBG (last 3)   Recent Labs  10/04/13 2350 10/05/13 0350 10/05/13 0757  GLUCAP 139* 164* 177*    Scheduled Meds: . acetaminophen  1,000 mg Oral TID  . amLODipine  5 mg Oral Daily  . antiseptic oral rinse  15 mL Mouth Rinse q12n4p  . apixaban  5 mg Oral BID  . atorvastatin  40 mg Oral QODAY  . carvedilol  3.125 mg Oral BID WC  . ciprofloxacin  500 mg Oral BID  . clotrimazole   Topical BID  . ferrous sulfate  325 mg Oral TID  . furosemide  40 mg Oral Daily  . hydrALAZINE  50 mg Oral Q8H  . insulin aspart  0-9 Units Subcutaneous Q4H  . insulin glargine  30 Units Subcutaneous QHS  . isosorbide mononitrate  30 mg Oral Daily  . lip balm  1 application Topical BID  . loperamide  4 mg Oral QID  . neomycin-bacitracin-polymyxin   Topical BID  . OxyCODONE  15 mg Oral Q12H  . potassium chloride  40 mEq Oral Daily  . sodium chloride  3 mL Intravenous Q12H  . zolpidem  10 mg Oral QHS    Continuous Infusions: . sodium chloride 20 mL/hr at 10/04/13 1800  . [name] KitchenTPN (CLINIMIX-E) Adult 125 mL/hr at 10/04/13 1800   And  . fat emulsion 250 mL (10/04/13 1800)    Mikey College MS, RD, LDN [phone] Pager [phone] After Hours Pager

## 2013-10-05 NOTE — Progress Notes (Signed)
Patient ID: Kurt Cross, male   DOB: [birth year], 62 y.o.   MRN: [id number]  Subjective: No changes overnight.  He is NPO.  On TPN.  Afebrile.  BP and blood glucose levels a little high.  Objective:  Vital signs:  Filed Vitals:   10/04/13 2315 10/05/13 0500 10/05/13 0515 10/05/13 0525  BP:   166/82 166/82  Pulse: 65   73  Temp:    97.9 F (36.6 C)  TempSrc:    Oral  Resp: 18   18  Height:      Weight:  336 lb 6.8 oz (152.6 kg)    SpO2: 95%   95%    Last BM Date: 10/02/13  Intake/Output   Yesterday:  12/29 0701 - 12/30 0700 In: 3240 [I.V.:400; TPN:2700] Out: 2425 [Urine:2225; Drains:200] This shift: I/O last 3 completed shifts: In: 5800 [I.V.:720; Other:220] Out: [id number]; Drains:450]    Physical Exam: General: Pt awake/alert/oriented x3 in no acute distress Abd: obese, soft, midline wound, fistula with bilious drainage  Problem List:   Principal Problem:   Entero-atmospheric fistula Active Problems:   Diabetes mellitus, insulin dependent (IDDM), uncontrolled   HYPERLIPIDEMIA   Obesity, Class III, BMI 40-49.9 (morbid obesity)   OBSTRUCTIVE SLEEP APNEA   Hypertension   Acute-on-chronic respiratory failure secondary to probable community-acquired pneumonia   Constipation, chronic   Chronic combined systolic and diastolic CHF (congestive heart failure)   Right leg pain   AAA (abdominal aortic aneurysm)/ 4.5 cm ascending per CT angio 08/01/13   Infected prosthetic mesh of abdominal wall with GIANT abscess s/p removal 08/14/2013   Recurrent ventral incisional hernia   Acute renal failure (ARF)    Results:   Labs: Results for orders placed during the hospital encounter of 08/11/13 (from the past 48 hour(s))  GLUCOSE, CAPILLARY     Status: Abnormal   Collection Time    10/03/13 12:02 PM      Result Value Range   Glucose-Capillary 130 (*) 70 - 99 mg/dL  GLUCOSE, CAPILLARY     Status: Abnormal   Collection Time    10/03/13  4:29 PM      Result Value  Range   Glucose-Capillary 151 (*) 70 - 99 mg/dL  GLUCOSE, CAPILLARY     Status: Abnormal   Collection Time    10/03/13  7:46 PM      Result Value Range   Glucose-Capillary 137 (*) 70 - 99 mg/dL  GLUCOSE, CAPILLARY     Status: Abnormal   Collection Time    10/03/13 11:23 PM      Result Value Range   Glucose-Capillary 138 (*) 70 - 99 mg/dL  GLUCOSE, CAPILLARY     Status: Abnormal   Collection Time    10/04/13  4:09 AM      Result Value Range   Glucose-Capillary 146 (*) 70 - 99 mg/dL  COMPREHENSIVE METABOLIC PANEL     Status: Abnormal   Collection Time    10/04/13  4:30 AM      Result Value Range   Sodium 136  135 - 145 mEq/L   Potassium 3.7  3.5 - 5.1 mEq/L   Chloride 100  96 - 112 mEq/L   CO2 27  19 - 32 mEq/L   Glucose, Bld 136 (*) 70 - 99 mg/dL   BUN 19  6 - 23 mg/dL   Creatinine, Ser 0.80  0.50 - 1.35 mg/dL   Calcium 9.0  8.4 - 10.5 mg/dL   Total Protein 7.1  6.0 - 8.3 g/dL   Albumin 2.2 (*) 3.5 - 5.2 g/dL   AST 38 (*) 0 - 37 U/L   ALT 56 (*) 0 - 53 U/L   Alkaline Phosphatase 81  39 - 117 U/L   Total Bilirubin 0.9  0.3 - 1.2 mg/dL   GFR calc non Af Amer >90  >90 mL/min   GFR calc Af Amer >90  >90 mL/min   Comment: (NOTE)     The eGFR has been calculated using the CKD EPI equation.     This calculation has not been validated in all clinical situations.     eGFR's persistently <90 mL/min signify possible Chronic Kidney     Disease.  MAGNESIUM     Status: None   Collection Time    10/04/13  4:30 AM      Result Value Range   Magnesium 1.6  1.5 - 2.5 mg/dL  PHOSPHORUS     Status: None   Collection Time    10/04/13  4:30 AM      Result Value Range   Phosphorus 4.1  2.3 - 4.6 mg/dL  CBC     Status: Abnormal   Collection Time    10/04/13  4:30 AM      Result Value Range   WBC 8.0  4.0 - 10.5 K/uL   RBC 3.13 (*) 4.22 - 5.81 MIL/uL   Hemoglobin 9.5 (*) 13.0 - 17.0 g/dL   HCT 28.9 (*) 39.0 - 52.0 %   MCV 92.3  78.0 - 100.0 fL   MCH 30.4  26.0 - 34.0 pg   MCHC 32.9   30.0 - 36.0 g/dL   RDW 14.4  11.5 - 15.5 %   Platelets 265  150 - 400 K/uL  DIFFERENTIAL     Status: None   Collection Time    10/04/13  4:30 AM      Result Value Range   Neutrophils Relative % 55  43 - 77 %   Neutro Abs 4.4  1.7 - 7.7 K/uL   Lymphocytes Relative 29  12 - 46 %   Lymphs Abs 2.3  0.7 - 4.0 K/uL   Monocytes Relative 12  3 - 12 %   Monocytes Absolute 1.0  0.1 - 1.0 K/uL   Eosinophils Relative 4  0 - 5 %   Eosinophils Absolute 0.3  0.0 - 0.7 K/uL   Basophils Relative 0  0 - 1 %   Basophils Absolute 0.0  0.0 - 0.1 K/uL  TRIGLYCERIDES     Status: None   Collection Time    10/04/13  4:30 AM      Result Value Range   Triglycerides 91  <150 mg/dL   Comment: Performed at [place]     Status: Abnormal   Collection Time    10/04/13  4:30 AM      Result Value Range   Prealbumin 14.4 (*) 17.0 - 34.0 mg/dL   Comment: Performed at [place], CAPILLARY     Status: Abnormal   Collection Time    10/04/13  7:47 AM      Result Value Range   Glucose-Capillary 143 (*) 70 - 99 mg/dL  GLUCOSE, CAPILLARY     Status: Abnormal   Collection Time    10/04/13 11:53 AM      Result Value Range   Glucose-Capillary 141 (*) 70 - 99 mg/dL  GLUCOSE, CAPILLARY     Status: Abnormal  Collection Time    10/04/13  4:16 PM      Result Value Range   Glucose-Capillary 169 (*) 70 - 99 mg/dL  GLUCOSE, CAPILLARY     Status: Abnormal   Collection Time    10/04/13  7:54 PM      Result Value Range   Glucose-Capillary 174 (*) 70 - 99 mg/dL  GLUCOSE, CAPILLARY     Status: Abnormal   Collection Time    10/04/13 11:50 PM      Result Value Range   Glucose-Capillary 139 (*) 70 - 99 mg/dL  GLUCOSE, CAPILLARY     Status: Abnormal   Collection Time    10/05/13  3:50 AM      Result Value Range   Glucose-Capillary 164 (*) 70 - 99 mg/dL    Imaging / Studies: No results found.  Medications / Allergies: per chart  Antibiotics: Anti-infectives   Start      Dose/Rate Route Frequency Ordered Stop   10/02/13 1600  ciprofloxacin (CIPRO) tablet 500 mg     500 mg Oral 2 times daily 10/02/13 1340     09/29/13 0000  vancomycin (VANCOCIN) 1,500 mg in sodium chloride 0.9 % 500 mL IVPB  Status:  Discontinued     1,500 mg 250 mL/hr over 120 Minutes Intravenous Every 12 hours 09/28/13 1115 10/02/13 1339   09/28/13 1200  piperacillin-tazobactam (ZOSYN) IVPB 3.375 g  Status:  Discontinued     3.375 g 12.5 mL/hr over 240 Minutes Intravenous 3 times per day 09/28/13 1113 10/02/13 1339   09/28/13 1200  vancomycin (VANCOCIN) 2,500 mg in sodium chloride 0.9 % 500 mL IVPB     2,500 mg 250 mL/hr over 120 Minutes Intravenous  Once 09/28/13 1115 09/28/13 1412   08/31/13 0800  vancomycin (VANCOCIN) 1,750 mg in sodium chloride 0.9 % 500 mL IVPB  Status:  Discontinued     1,750 mg 250 mL/hr over 120 Minutes Intravenous Every 24 hours 08/31/13 0753 08/31/13 1417   08/16/13 0800  vancomycin (VANCOCIN) 2,000 mg in sodium chloride 0.9 % 500 mL IVPB  Status:  Discontinued     2,000 mg 250 mL/hr over 120 Minutes Intravenous Every 24 hours 08/15/13 1411 08/31/13 0753   08/15/13 1500  vancomycin (VANCOCIN) 2,000 mg in sodium chloride 0.9 % 500 mL IVPB     2,000 mg 250 mL/hr over 120 Minutes Intravenous  Once 08/15/13 1411 08/15/13 1713   08/14/13 1600  piperacillin-tazobactam (ZOSYN) IVPB 3.375 g  Status:  Discontinued     3.375 g 12.5 mL/hr over 240 Minutes Intravenous Every 8 hours 08/14/13 1543 08/31/13 1417      Assessment/Plan  1. Infected prosthetic mesh of abdominal wall with GIANT abscess s/p removal 08/14/2013 Dr. Michael Boston 2. Entero-atmospheric fistula-on bowel rest, Sandostatin stopped.  On TPN; Ongoing fistula output slowing down 3. Sepsis-resolved  4. Afib on BB and chronic anticoagulation with Apixaban (Eliquis)  5. Hx of chronic combined diastolic and systolic CHF-stable  6. Diabetes mellitus-on SSI, lantus 7. Stage III chronic renal disease-stable 8.  Hypertension-management per IM 9. Hyperlipidemia  10. Obesity, Class III, BMI 40-49.9 (morbid obesity)  11. 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 Deconditioning-PT  Plan: would like to examine the wound with WOC. Continue with suction for now (unable to find facility who can provide and therefore the patient remains inpatient).      Rosario Adie, MD  Colorectal and General Surgery [facility] Surgery  10/05/2013 7:55 AM   .

## 2013-10-05 NOTE — Progress Notes (Signed)
PARENTERAL NUTRITION CONSULT NOTE - FOLLOW UP  Pharmacy Consult for TNA Indication: Enterocutaneous fistula  No Known Allergies  Patient Measurements: Height: 6' (182.9 cm) Weight: 336 lb 6.8 oz (152.6 kg) (Extra mattress applied 28.0 deducted) IBW/kg (Calculated) : 77.6 Adjusted Body Weight: 102.6kg  Vital Signs: Temp: 97.9 F (36.6 C) (12/30 0525) Temp src: Oral (12/30 0525) BP: 166/82 mmHg (12/30 0525) Pulse Rate: 73 (12/30 0525) Intake/Output from previous day: 12/29 0701 - 12/30 0700 In: 3240 [I.V.:400; TPN:2700] Out: 2425 [Urine:2225; Drains:200]  Labs:  Recent Labs  10/04/13 0430  WBC 8.0  HGB 9.5*  HCT 28.9*  PLT 265    Recent Labs  10/04/13 0430  NA 136  K 3.7  CL 100  CO2 27  GLUCOSE 136*  BUN 19  CREATININE 0.80  CALCIUM 9.0  MG 1.6  PHOS 4.1  PROT 7.1  ALBUMIN 2.2*  AST 38*  ALT 56*  ALKPHOS 81  BILITOT 0.9  PREALBUMIN 14.4*  TRIG 91  Calcium 10.4  Recent Labs  10/04/13 2350 10/05/13 0350 10/05/13 0757  GLUCAP 139* 164* 177*   Insulin Requirements in the past 24 hours:   CBGs < 150, required 9 units sensitive SSI q4h  TNA contains regular insulin 50 units per 3L bag (16.6 units/L, increased on 12/24)  Lantus 30 units QHS  Nutritional Goals:   Goals per RD 12/23:  [id number] kcal/day, 160-180 grams/day protein, 3.3-3.5 L/day fluid.  Clinimix E 5/15 at goal rate 125 ml/hr (3L/day) with 20% lipids at 10 ml/hr to provide 150g protein and 2675 kcal daily.  Due to how much fluid can be placed in TNA bag, maximum volume is 3L.  This rate will meet >/= 90% of estimated needs.  Current Nutrition:   NPO except for ice chips, sips with meds  Clinimix E5/15 at 141ml/hr and 20% lipids at 10 ml/hr over 24 hours  IVF: NS at [place]: 62 yo male with large abdominal abscess with infected old [place] mesh s/p I&D abscess, removal of mesh, debridement of abdominal wall and peritoneum. Now with new small bowel enterocutaneous  fistula. TNA started 11/12 at 10pm.  Patient currently unable to be discharged to a facility nearby that can accommodate both TNA and drainage system.   On 12/21, [facility] requested re-trying cyclic TNA, but because there is no advantage for discharge planning and pt has increased risks of fluid overload and hypoglycemia, we will continue with 24 hr continuous TPN.  12/30:  Day #48 TNA. Pt remains NPO and tolerating 24 hour continuous TPN. Abx changed to Cipro. Patient needs LTACH at discharge, awaiting bed availability at [facility] as patient refusing to got to [facility] in [place] [employer]. No significant changes.    Labs:   Glucose: Hx DM on insulin PTA. CBG's mostly at goal, 2 readings in past 24 hours were in the 170s . Regular insulin in TPN increased on 12/24.  Currently on sensitive SSI Q4h, Lantus 30 units QHS     Electrolytes: WNL on 12/29  Renal:  SCr WNL, stable    LFTs: AST and ALT remain slightly elevated (<1.5 x ULN), but have improved.  Bilirubin has improved to WNL.  TGs: WNL   Prealbumin improving: 10.3 (11/13), 11.1 (11/17), 11.2(11/24), 13.6 (12/1), 10.3 (12/8), 13.4 (12/15), 15.4 (12/22), 14.4 (12/29)  Plan:   Continue Clinimix E 5/15 at 125 ml/hr with lipids 20% at 10 ml/hr over 24 hours.     Continue insulin 50 units per 3L bag (16.6 units/L)  TNA to contain standard multivitamins and trace elements.  Continue CBG checks q4h, will advance to moderate SSI coverage, and f/u on CBG's for next 24 hours.   Continue Lantus 30 units QHS.  KCl 45mEq PO daily per MD  Continue NS at [place] on Mondays & Thursdays.   F/U discharge plans and TNA arrangement.  Abcde Oneil, Gaye Alken PharmD Pager #: [phone] 10:50 AM 10/05/2013

## 2013-10-05 NOTE — Consult Note (Signed)
WOC wound follow up Wound type:Open abdominal wound with stomatized enteric fistula. I have notified my partners to page MD/PA/NP for [facility] when next pouch change performed. Planned change is Thursday or Friday, January 1 or 2. [facility] [place] [facility] nursing team will continue to follow, and will remain available to this patient, the nursing, medical and surgical teams.  Thanks, Maudie Flakes, MSN, RN, [place], [place], [place] [phone])

## 2013-10-06 DIAGNOSIS — I4891 Unspecified atrial fibrillation: Secondary | ICD-10-CM | POA: Diagnosis present

## 2013-10-06 LAB — GLUCOSE, CAPILLARY
Glucose-Capillary: 139 mg/dL — ABNORMAL HIGH (ref 70–99)
Glucose-Capillary: 141 mg/dL — ABNORMAL HIGH (ref 70–99)
Glucose-Capillary: 128 mg/dL — ABNORMAL HIGH (ref 70–99)
Glucose-Capillary: 124 mg/dL — ABNORMAL HIGH (ref 70–99)

## 2013-10-06 MED ORDER — DIPHENOXYLATE-ATROPINE 2.5-0.025 MG PO TABS: 1.0000 | ORAL_TABLET | Freq: Four times a day (QID) | ORAL | Status: DC | PRN

## 2013-10-06 MED ORDER — M.V.I. ADULT IV INJ
INTRAVENOUS | Status: AC
  Administered 2013-10-06: 18:00:00 via INTRAVENOUS
  Filled 2013-10-06: qty 3000

## 2013-10-06 MED ORDER — FAT EMULSION 20 % IV EMUL
250.0000 mL | INTRAVENOUS | Status: AC
  Administered 2013-10-06: 250 mL via INTRAVENOUS
  Filled 2013-10-06: qty 250

## 2013-10-06 NOTE — Progress Notes (Signed)
Patient ID: Kurt Cross, male   DOB: [birth year], 62 y.o.   MRN: [id number]   Subjective: No changes overnight.  Continues TPN, bowel rest.  He remains afebrile.  BP still high, with SBP 160s.  CBGs are better.  Objective:  Vital signs:  Filed Vitals:   10/05/13 0525 10/05/13 1400 10/05/13 2110 10/06/13 0605  BP: 166/82 166/77 139/60 165/66  Pulse: 73 76 59 74  Temp: 97.9 F (36.6 C) 98.4 F (36.9 C) 98.7 F (37.1 C) 99 F (37.2 C)  TempSrc: Oral Oral Oral Oral  Resp: 18 18 18 18   Height:      Weight:    393 lb 11.9 oz (178.6 kg)  SpO2: 95% 99% 97% 100%    Last BM Date: 10/05/13  Intake/Output   Yesterday:  12/30 0701 - 12/31 0700 In: 620 [I.V.:80; TPN:540] Out: 4150 [Urine:3650; Drains:500] This shift:  I/O last 3 completed shifts: In: 1860 [I.V.:240] Out: 5250 [Urine:4750; Drains:500]   Physical Exam:  General: Pt awake/alert/oriented x3 in no acute distress  Lungs: CTA Cardio: S1S2 rrr, no murmurs gallops or rubs. Abd: obese, soft, midline wound, fistula with brown output, on suction  Problem List:   Principal Problem:   Entero-atmospheric fistula Active Problems:   Diabetes mellitus, insulin dependent (IDDM), uncontrolled   HYPERLIPIDEMIA   Obesity, Class III, BMI 40-49.9 (morbid obesity)   OBSTRUCTIVE SLEEP APNEA   Hypertension   Acute-on-chronic respiratory failure secondary to probable community-acquired pneumonia   Constipation, chronic   Chronic combined systolic and diastolic CHF (congestive heart failure)   Right leg pain   AAA (abdominal aortic aneurysm)/ 4.5 cm ascending per CT angio 08/01/13   Infected prosthetic mesh of abdominal wall with GIANT abscess s/p removal 08/14/2013   Recurrent ventral incisional hernia   Acute renal failure (ARF)    Results:   Labs: Results for orders placed during the hospital encounter of 08/11/13 (from the past 48 hour(s))  GLUCOSE, CAPILLARY     Status: Abnormal   Collection Time    10/04/13 11:53 AM       Result Value Range   Glucose-Capillary 141 (*) 70 - 99 mg/dL  GLUCOSE, CAPILLARY     Status: Abnormal   Collection Time    10/04/13  4:16 PM      Result Value Range   Glucose-Capillary 169 (*) 70 - 99 mg/dL  GLUCOSE, CAPILLARY     Status: Abnormal   Collection Time    10/04/13  7:54 PM      Result Value Range   Glucose-Capillary 174 (*) 70 - 99 mg/dL  GLUCOSE, CAPILLARY     Status: Abnormal   Collection Time    10/04/13 11:50 PM      Result Value Range   Glucose-Capillary 139 (*) 70 - 99 mg/dL  GLUCOSE, CAPILLARY     Status: Abnormal   Collection Time    10/05/13  3:50 AM      Result Value Range   Glucose-Capillary 164 (*) 70 - 99 mg/dL  GLUCOSE, CAPILLARY     Status: Abnormal   Collection Time    10/05/13  7:57 AM      Result Value Range   Glucose-Capillary 177 (*) 70 - 99 mg/dL  GLUCOSE, CAPILLARY     Status: Abnormal   Collection Time    10/05/13 12:17 PM      Result Value Range   Glucose-Capillary 205 (*) 70 - 99 mg/dL  GLUCOSE, CAPILLARY     Status: Abnormal  Collection Time    10/05/13  4:44 PM      Result Value Range   Glucose-Capillary 216 (*) 70 - 99 mg/dL  GLUCOSE, CAPILLARY     Status: Abnormal   Collection Time    10/05/13  8:07 PM      Result Value Range   Glucose-Capillary 174 (*) 70 - 99 mg/dL   Comment 1 Documented in Chart     Comment 2 Notify RN    GLUCOSE, CAPILLARY     Status: Abnormal   Collection Time    10/05/13 11:45 PM      Result Value Range   Glucose-Capillary 131 (*) 70 - 99 mg/dL   Comment 1 Documented in Chart     Comment 2 Notify RN    GLUCOSE, CAPILLARY     Status: Abnormal   Collection Time    10/06/13  3:58 AM      Result Value Range   Glucose-Capillary 139 (*) 70 - 99 mg/dL   Comment 1 Documented in Chart     Comment 2 Notify RN    GLUCOSE, CAPILLARY     Status: Abnormal   Collection Time    10/06/13  8:21 AM      Result Value Range   Glucose-Capillary 141 (*) 70 - 99 mg/dL    Imaging / Studies: No results  found.  Medications / Allergies: per chart  Antibiotics: Anti-infectives   Start     Dose/Rate Route Frequency Ordered Stop   10/02/13 1600  ciprofloxacin (CIPRO) tablet 500 mg     500 mg Oral 2 times daily 10/02/13 1340     09/29/13 0000  vancomycin (VANCOCIN) 1,500 mg in sodium chloride 0.9 % 500 mL IVPB  Status:  Discontinued     1,500 mg 250 mL/hr over 120 Minutes Intravenous Every 12 hours 09/28/13 1115 10/02/13 1339   09/28/13 1200  piperacillin-tazobactam (ZOSYN) IVPB 3.375 g  Status:  Discontinued     3.375 g 12.5 mL/hr over 240 Minutes Intravenous 3 times per day 09/28/13 1113 10/02/13 1339   09/28/13 1200  vancomycin (VANCOCIN) 2,500 mg in sodium chloride 0.9 % 500 mL IVPB     2,500 mg 250 mL/hr over 120 Minutes Intravenous  Once 09/28/13 1115 09/28/13 1412   08/31/13 0800  vancomycin (VANCOCIN) 1,750 mg in sodium chloride 0.9 % 500 mL IVPB  Status:  Discontinued     1,750 mg 250 mL/hr over 120 Minutes Intravenous Every 24 hours 08/31/13 0753 08/31/13 1417   08/16/13 0800  vancomycin (VANCOCIN) 2,000 mg in sodium chloride 0.9 % 500 mL IVPB  Status:  Discontinued     2,000 mg 250 mL/hr over 120 Minutes Intravenous Every 24 hours 08/15/13 1411 08/31/13 0753   08/15/13 1500  vancomycin (VANCOCIN) 2,000 mg in sodium chloride 0.9 % 500 mL IVPB     2,000 mg 250 mL/hr over 120 Minutes Intravenous  Once 08/15/13 1411 08/15/13 1713   08/14/13 1600  piperacillin-tazobactam (ZOSYN) IVPB 3.375 g  Status:  Discontinued     3.375 g 12.5 mL/hr over 240 Minutes Intravenous Every 8 hours 08/14/13 1543 08/31/13 1417     Assessment/Plan  1. Infected prosthetic mesh of abdominal wall with GIANT abscess s/p removal 08/14/2013 Dr. Michael Boston  2. Entero-atmospheric fistula-on bowel rest, Sandostatin stopped. On TPN; Ongoing fistula output .  3. Sepsis-resolved  4. Afib on BB and chronic anticoagulation with Apixaban (Eliquis)  5. Hx of chronic combined diastolic and systolic CHF-stable  6.  Diabetes  mellitus-on SSI, lantus.  Discussed with IM regarding elevated BGs, please adjust insulin as necessary to achieve tighter glucose control.   7. Stage III chronic renal disease-stable  8. Hypertension-BP has been consistently elevated, please adjust anti-hypertensives for improved BP control  9. Hyperlipidemia  10. Obesity, Class III, BMI 40-49.9 (morbid obesity)  11. 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 Deconditioning-PT   Plan: continue with suction(facilities unable to accommodate this and therefore the patient remains inpatient).  Continue bowel rest, TPN.  WOC to change pouch tomorrow or Friday, please notify [facility] to coordinate time for [place] to examine the wound.    Erby Pian, [facility] Surgery Pager [phone] Office [phone]  10/06/2013 10:05 AM

## 2013-10-06 NOTE — Progress Notes (Signed)
ATTENDING ADDENDUM:  I personally reviewed patient's record, examined the patient, and formulated the following assessment and plan:  No new issues.  Appreciate assistance with blood glucose treatment.

## 2013-10-06 NOTE — Progress Notes (Signed)
Physical Therapy Treatment Patient Details Name: Kurt Cross MRN: [id number] DOB: [birth year] Today's Date: 10/06/2013 Time: [id number] PT Time Calculation (min): 41 min  PT Assessment / Plan / Recommendation  History of Present Illness Kurt Cross is a 62 y.o. male  with known history of chronic systolic heart failure status post defibrillator placement last EF measured was in 2013 was 35-40%, on home oxygen, OSA noncompliant with CPAP, diabetes mellitus, hypertension and hyperlipidemia presented to the ER because right leg pain after sustaining a fall the night prior to admission.  08/14/13: s/p abdominal abscess removal   PT Comments   Pt amb full unit around in segments of about 125 feet each walk followed by a sitting rest break a total of about 500 feet.   Follow Up Recommendations  SNF     Does the patient have the potential to tolerate intense rehabilitation     Barriers to Discharge        Equipment Recommendations       Recommendations for Other Services    Frequency Min 2X/week   Progress towards PT Goals Progress towards PT goals: Progressing toward goals  Plan      Precautions / Restrictions Precautions Precautions: Fall Precaution Comments: ostomy bag on R side and wall suction to ABD Restrictions Weight Bearing Restrictions: No    Pertinent Vitals/Pain No c/o pain    Mobility  Bed Mobility Bed Mobility: Not assessed Details for Bed Mobility Assistance: Pt OOB in recliner Transfers Transfers: Sit to Stand;Stand to Sit Sit to Stand: 5: Supervision;4: Min guard;From chair/3-in-1 Stand to Sit: 5: Supervision;4: Min guard Details for Transfer Assistance: pt uses momentum forward motion to stand and tends to "plop" uncontrolled with sitting Ambulation/Gait Ambulation/Gait Assistance: 4: Min guard Ambulation Distance (Feet): 500 Feet (125 feet x 4 sitting rest breaks between) Assistive device: Rolling walker Ambulation/Gait Assistance Details: increased  time and c/o R LE weakness > L LE.   Gait Pattern: Step-through pattern;Wide base of support;Decreased stride length;Ataxic Gait velocity: decreased     PT Goals (current goals can now be found in the care plan section)    Visit Information  Last PT Received On: 10/06/13 Assistance Needed: +2 (for safety and chair to follow) History of Present Illness: Kurt Cross is a 62 y.o. male  with known history of chronic systolic heart failure status post defibrillator placement last EF measured was in 2013 was 35-40%, on home oxygen, OSA noncompliant with CPAP, diabetes mellitus, hypertension and hyperlipidemia presented to the ER because right leg pain after sustaining a fall the night prior to admission.  08/14/13: s/p abdominal abscess removal    Subjective Data      Cognition       Balance     End of Session PT - End of Session Equipment Utilized During Treatment: Gait belt Activity Tolerance: Patient tolerated treatment well Patient left: in chair;with call bell/phone within reach   Rica Koyanagi  PTA [facility]  Acute  Rehab Pager      [phone]

## 2013-10-06 NOTE — Progress Notes (Signed)
[facility] PROGRESS NOTE   Kurt Cross [id number] DOB: September 19, 1951 DOA: 08/11/2013 PCP: Kurt Gravel, MD  Brief narrative: 62 year old gentleman with a PMH of chronic systolic CHF, status post AICD, obesity, OSA, type 2 DM,  Recent hospitalization 08/01/2013-08/05/2013 for treatment of pneumonia, who was admitted on 08/11/2013 with right leg pain and inability to ambulate. Initial imaging studies of right hip were negative, but he was noted to be febrile, with leukocytosis, so a CT scan of abdomen and pelvis was performed on 08/14/2013 which showed 14.5 x 18.1 x 26.1 cm fluid collection/abscess within the anterior abdominal wall. Findings were consistent for infected mesh, and general surgery was consulted. He was taken to the OR on 08/14/2013 and underwent I & D of the abscess with Gore-Tex mesh removal, debridement of the abdominal wall and peritoneum by Dr. Johney Cross.  Postprocedure, the patient developed hypotension concerning for sepsis that responded to aggressive IV fluid resuscitation. PCCM was consulted. Patient up to this point had been on broad-spectrum empiric IV antibiotic therapy with vancomycin and Zosyn. 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 He was made n.p.o., with PICC line placement and started on TPN. Eakin's pouch was placed on low suction to prevent pooling of enteric contents into wound.  Attempts to get him placed to local LTAC have been unsuccessful.  On 09/28/2013 he developed a temperature 101.8. Patient overnight had reported having some cough with green sputum production. I ordered a chest x-Kurt Cross which showed no active cardiopulmonary disease. CBC showed a white count of 8500 . A urinalysis showed positive nitrates and leukocyte esterase with rare bacteria. Lactic acid was within normal range at 0.8. Case was discussed with general surgery, and it was not felt that this  represented infection of his abdominal wall. Given his extensive history and complicated hospital course he was started on empirically with IV vancomycin and Zosyn. Blood cultures were ordered yesterday, but patient refused them initially. We persuaded the patient to agree for blood cultures.ONE SET of blood cultures done and have been negative for 72 hours. The broad spectrum antibiotics were stopped and cipro was added to complete the course for possible UTI.   Assessment/Plan: Principal Problem:   Entero-atmospheric fistula Continue bowel rest and Eakin's pouch to low suction. Surgery following. Active Problems:   Atrial fibrillation/flutter Likely triggered by infection. Back in normal sinus rhythm. Cardiac markers negative x3. EF 55% on most recent echocardiogram. Continue Coreg and Eliquis.  CHADSVASc score is 4   Febrile illness Continue Cipro. Recently treated for sepsis secondary to coagulase-negative Staphylococcus status post 2 weeks of vancomycin. Blood cultures done 09/29/2013 are negative.   Diabetes mellitus, insulin dependent (IDDM), uncontrolled Currently being managed with 30 units of Lantus daily as well as SSI, moderate scale every 4 hours. CBGs 131-216.   HYPERLIPIDEMIA  Continue Lipitor.   Obesity, Class III, BMI 40-49.9 (morbid obesity) Currently on TNA.   OBSTRUCTIVE SLEEP APNEA Continue CPAP each bedtime.   Hypertension Continue Norvasc, Imdur, Lasix, hydralazine and Coreg.   Acute-on-chronic respiratory failure secondary to probable community-acquired pneumonia Resolved. Chest x-rays done 09/28/2013 and 10/02/2013 negative for pneumonia.   Constipation, chronic Continue laxatives as needed.   Chronic combined systolic and diastolic CHF (congestive heart failure) Last 2-D echocardiogram done 08/12/2013. EF 50-55%. Currently compensated.   Right leg pain Treating symptomatically with oxycodone as needed.   AAA (abdominal aortic aneurysm)/ 4.5 cm ascending per  CT  angio 08/01/13 Recommend close outpatient followup.   Infected prosthetic mesh of abdominal wall with GIANT abscess s/p removal 08/14/2013 Per surgery.   Recurrent ventral incisional hernia Per surgery.   Acute renal failure (ARF) Resolved.  Code Status: Full. Family Communication: No family at bedside. Disposition Plan: Unclear.  ? LTAC, ? SNF   IV access:  Triple lumen PICC placed 08/18/13   Anti-infectives:  Vancomycin 08/15/13>>> 08/31/13   Zosyn 08/14/13>>>08/31/13   Cipro 10/02/2013 >>>   HPI/Subjective: Kurt Cross complains of chronic stomach and leg pain, but tells me he ambulated today.  No nausea or vomiting.  Only able to take in ice chips (on TNA).    Objective: Filed Vitals:   10/05/13 0525 10/05/13 1400 10/05/13 2110 10/06/13 0605  BP: 166/82 166/77 139/60 165/66  Pulse: 73 76 59 74  Temp: 97.9 F (36.6 C) 98.4 F (36.9 C) 98.7 F (37.1 C) 99 F (37.2 C)  TempSrc: Oral Oral Oral Oral  Resp: 18 18 18 18   Height:      Weight:    178.6 kg (393 lb 11.9 oz)  SpO2: 95% 99% 97% 100%    Intake/Output Summary (Last 24 hours) at 10/06/13 0951 Last data filed at 10/06/13 [id number]  Gross per 24 hour  Intake    620 ml  Output   3850 ml  Net  -3230 ml    Exam: Gen:  NAD Cardiovascular:  RRR, No M/R/G Respiratory:  Lungs diminished in the bases Gastrointestinal:  Abdomen obese, distended, Eakin's pouch with feculent drainage Extremities:  No C/E/C  Data Reviewed: Basic Metabolic Panel:  Recent Labs Lab 09/30/13 0508 10/04/13 0430  NA 133* 136  K 3.8 3.7  CL 99 100  CO2 23 27  GLUCOSE 167* 136*  BUN 21 19  CREATININE 1.00 0.80  CALCIUM 8.5 9.0  MG 1.6 1.6  PHOS 4.2 4.1   GFR Estimated Creatinine Clearance: 163.9 ml/min (by C-G formula based on Cr of 0.8). Liver Function Tests:  Recent Labs Lab 09/30/13 0508 10/04/13 0430  AST 72* 38*  ALT 86* 56*  ALKPHOS 79 81  BILITOT 1.3* 0.9  PROT 7.0 7.1  ALBUMIN 2.1* 2.2*     CBC:  Recent Labs Lab 09/30/13 1130 10/04/13 0430  WBC 7.8 8.0  NEUTROABS  --  4.4  HGB 9.8* 9.5*  HCT 29.6* 28.9*  MCV 92.2 92.3  PLT 266 265   BNP (last 3 results)  Recent Labs  08/01/13 1649  PROBNP 481.4*   CBG:  Recent Labs Lab 10/05/13 1644 10/05/13 2007 10/05/13 2345 10/06/13 0358 10/06/13 0821  GLUCAP 216* 174* 131* 139* 141*   Lipid Profile  Recent Labs  10/04/13 0430  TRIG 91   Microbiology Recent Results (from the past 240 hour(s))  CULTURE, BLOOD (ROUTINE X 2)     Status: None   Collection Time    09/29/13  3:55 PM      Result Value Range Status   Specimen Description BLOOD LEFT ARM   Final   Special Requests BOTTLES DRAWN AEROBIC AND ANAEROBIC 10CC   Final   Culture  Setup Time     Final   Value: 09/29/2013 22:13     Performed at [facility]   Culture     Final   Value: NO GROWTH 5 DAYS     Performed at [facility]   Report Status 10/05/2013 FINAL   Final     Procedures and Diagnostic Studies: Dg Chest  1 View  10/02/2013   CLINICAL DATA:  Bedside PICC placement.  EXAM: PORTABLE CHEST - 1 VIEW  COMPARISON:  Two-view chest x-[facility] 09/28/2013.  FINDINGS: Right arm PICC tip projects over the lower SVC at or near the cavoatrial junction. Cardiac silhouette enlarged but stable. Mild pulmonary venous hypertension without overt edema. Lungs clear. No visible pleural effusions. Left subclavian biventricular transvenous pacemaker unchanged.  IMPRESSION: 1. Right arm PICC tip projects over the lower SVC at or near the cavoatrial junction. 2. Stable cardiomegaly. Pulmonary venous hypertension without overt edema. No acute cardiopulmonary disease.   Electronically Signed   By: Evangeline Dakin M.D.   On: 10/02/2013 16:54   Dg Chest 2 View  09/28/2013   CLINICAL DATA:  Fever.  Weakness.  EXAM: CHEST  2 VIEW  COMPARISON:  08/18/2013  FINDINGS: Cardiac silhouette is mildly enlarged. Mediastinum is normal in contour. The lungs are  clear. Right PICC tip is in the upper superior vena cava, and appears mildly retracted compared to the prior study. Left anterior chest wall sequential pacemaker is stable in well positioned. Bony thorax is intact.  IMPRESSION: No active cardiopulmonary disease.   Electronically Signed   By: Lajean Manes M.D.   On: 09/28/2013 13:36    Scheduled Meds: . acetaminophen  1,000 mg Oral TID  . amLODipine  5 mg Oral Daily  . antiseptic oral rinse  15 mL Mouth Rinse q12n4p  . apixaban  5 mg Oral BID  . atorvastatin  40 mg Oral QODAY  . carvedilol  3.125 mg Oral BID WC  . ciprofloxacin  500 mg Oral BID  . clotrimazole   Topical BID  . ferrous sulfate  325 mg Oral TID  . furosemide  40 mg Oral Daily  . hydrALAZINE  50 mg Oral Q8H  . insulin aspart  0-15 Units Subcutaneous Q4H  . insulin aspart  2 Units Subcutaneous TID WC  . insulin glargine  30 Units Subcutaneous QHS  . isosorbide mononitrate  30 mg Oral Daily  . lip balm  1 application Topical BID  . loperamide  4 mg Oral QID  . neomycin-bacitracin-polymyxin   Topical BID  . OxyCODONE  15 mg Oral Q12H  . potassium chloride  40 mEq Oral Daily  . sodium chloride  3 mL Intravenous Q12H  . zolpidem  10 mg Oral QHS   Continuous Infusions: . sodium chloride 20 mL/hr (10/05/13 2036)  . [name] KitchenTPN (CLINIMIX-E) Adult 125 mL/hr at 10/05/13 1727   And  . fat emulsion 250 mL (10/05/13 1726)  . [name] KitchenTPN (CLINIMIX-E) Adult     And  . fat emulsion      Time spent: 25 minutes.   LOS: 57 days   [facility] Pager [phone].   *Please note that the hospitalists switch teams on Wednesdays. Please call the flow manager at [phone] if you are having difficulty reaching the hospitalist taking care of this patient as she can update you and provide the most up-to-date pager number of provider caring for the patient. If 8PM-8AM, please contact night-coverage at [email or web address], password [facility]  10/06/2013, 9:51 AM

## 2013-10-06 NOTE — Progress Notes (Signed)
PARENTERAL NUTRITION CONSULT NOTE - FOLLOW UP  Pharmacy Consult for TNA Indication: Enterocutaneous fistula  No Known Allergies  Patient Measurements: Height: 6' (182.9 cm) Weight: 393 lb 11.9 oz (178.6 kg) IBW/kg (Calculated) : 77.6 Adjusted Body Weight: 102.6kg  Vital Signs: Temp: 99 F (37.2 C) (12/31 0605) Temp src: Oral (12/31 0605) BP: 165/66 mmHg (12/31 0605) Pulse Rate: 74 (12/31 0605) Intake/Output from previous day: 12/30 0701 - 12/31 0700 In: 620 [I.V.:80; TPN:540] Out: 4150 [Urine:3650; Drains:500]  Labs:  Recent Labs  10/04/13 0430  WBC 8.0  HGB 9.5*  HCT 28.9*  PLT 265    Recent Labs  10/04/13 0430  NA 136  K 3.7  CL 100  CO2 27  GLUCOSE 136*  BUN 19  CREATININE 0.80  CALCIUM 9.0  MG 1.6  PHOS 4.1  PROT 7.1  ALBUMIN 2.2*  AST 38*  ALT 56*  ALKPHOS 81  BILITOT 0.9  PREALBUMIN 14.4*  TRIG 91  Calcium 10.4  Recent Labs  10/05/13 2345 10/06/13 0358 10/06/13 0821  GLUCAP 131* 139* 141*   Insulin Requirements in the past 24 hours:   CBGs < 150, required 18 units of moderate-scale SSI   TNA contains regular insulin 50 units per 3L bag (16.6 units/L, increased on 12/24)  Lantus 30 units QHS  Nutritional Goals:   Goals per RD 12/30:  2370-2650 kcal/day, 160-180 grams/day protein, 3.3-3.5 L/day fluid.  Clinimix E 5/15 at goal rate 125 ml/hr (3L/day) with 20% lipids at 10 ml/hr to provide 150g protein and 2675 kcal daily.  Due to TNA bag capacity, maximum volume is 3L per 24 hrs.  This rate will meet >/= 90% of estimated needs.  Current Nutrition:   NPO except for ice chips, sips with meds  Clinimix E 5/15 at 131ml/hr and 20% lipids at 10 ml/hr  IVF: NS at [place]: 62 yo male with large abdominal abscess with infected old Gore Tex mesh s/p I&D abscess, removal of mesh, debridement of abdominal wall and peritoneum. 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 TNA started 11/12.  Patient currently unable to be  discharged to a facility nearby that can accommodate both TNA and wound drainage system.   On 12/21, [facility] requested re-trying cyclic TNA, but because there is no advantage for discharge planning and pt has increased risks of fluid overload and hypoglycemia, we will continue with 24 hr continuous TPN.  12/31:  Day #49 TNA. Pt remains NPO and tolerating 24 hour continuous TPN.  Empiric abx changed to Cipro 12/27. Patient needs LTAC at discharge, awaiting bed availability at [facility] as patient refusing to got to [facility] in [place]. No significant changes with nutritional therapy in past 24 hours  Labs:   Glucose: Hx DM, on insulin PTA. CBGs are improved, < 150 after increasing Novolog to moderate-scale SSI q4h yesterday. Continues Lantus qhs and insulin in TNA as outlined above.     Electrolytes: WNL on 12/29  Renal:  SCr WNL on 12/29, stable    LFTs: AST and ALT remained slightly elevated on 12/29 (<1.5 x ULN), but were improved.  Bilirubin improved to WNL.  TGs: WNL on 12/29   Prealbumin: 10.3 (11/13), 11.1 (11/17), 11.2(11/24), 13.6 (12/1), 10.3 (12/8), 13.4 (12/15), 15.4 (12/22), 14.4 (12/29) - slightly low, but improved from baseline  Plan:   Continue Clinimix E 5/15 at 125 ml/hr with lipids 20% at 10 ml/hr over 24 hours.     Continue insulin 50 units per 3L bag (16.6 units/L)  TNA to contain standard multivitamins and trace elements.  Continue CBG checks q4h, moderate SSI coverage .   Continue Lantus 30 units QHS.  KCl 86mEq PO daily per MD  Continue NS at [facility] lab panels on Mondays & Thursdays - next due tomorrow (10/07/13).   F/U discharge plans and TNA arrangement.  Joycelyn Rua PharmD, BCPS Pager #: [phone] 8:28 AM 10/06/2013

## 2013-10-07 LAB — GLUCOSE, CAPILLARY
GLUCOSE-CAPILLARY: 133 mg/dL — AB (ref 70–99)
GLUCOSE-CAPILLARY: 161 mg/dL — AB (ref 70–99)
Glucose-Capillary: 128 mg/dL — ABNORMAL HIGH (ref 70–99)
Glucose-Capillary: 131 mg/dL — ABNORMAL HIGH (ref 70–99)
Glucose-Capillary: 92 mg/dL (ref 70–99)

## 2013-10-07 LAB — COMPREHENSIVE METABOLIC PANEL
ALT: 51 U/L (ref 0–53)
AST: 37 U/L (ref 0–37)
Albumin: 2.2 g/dL — ABNORMAL LOW (ref 3.5–5.2)
Alkaline Phosphatase: 69 U/L (ref 39–117)
BUN: 18 mg/dL (ref 6–23)
CO2: 28 mEq/L (ref 19–32)
Calcium: 9 mg/dL (ref 8.4–10.5)
Chloride: 98 mEq/L (ref 96–112)
Creatinine, Ser: 0.85 mg/dL (ref 0.50–1.35)
GFR calc Af Amer: >90 mL/min (ref >90)
GFR calc non Af Amer: >90 mL/min (ref >90)
Glucose, Bld: 133 mg/dL — ABNORMAL HIGH (ref 70–99)
Potassium: 3.6 mEq/L — ABNORMAL LOW (ref 3.7–5.3)
Sodium: 135 mEq/L — ABNORMAL LOW (ref 137–147)
Total Bilirubin: 0.9 mg/dL (ref 0.3–1.2)
Total Protein: 6.9 g/dL (ref 6.0–8.3)

## 2013-10-07 LAB — PHOSPHORUS: Phosphorus: 4.9 mg/dL — ABNORMAL HIGH (ref 2.3–4.6)

## 2013-10-07 LAB — CBC
HCT: 33.3 % — ABNORMAL LOW (ref 39.0–52.0)
Hemoglobin: 11 g/dL — ABNORMAL LOW (ref 13.0–17.0)
MCH: 30.7 pg (ref 26.0–34.0)
MCHC: 33 g/dL (ref 30.0–36.0)
MCV: 93 fL (ref 78.0–100.0)
Platelets: 376 10*3/uL (ref 150–400)
RBC: 3.58 MIL/uL — ABNORMAL LOW (ref 4.22–5.81)
RDW: 14.7 % (ref 11.5–15.5)
WBC: 12.5 10*3/uL — ABNORMAL HIGH (ref 4.0–10.5)

## 2013-10-07 LAB — MAGNESIUM: Magnesium: 1.5 mg/dL (ref 1.5–2.5)

## 2013-10-07 MED ORDER — AMLODIPINE BESYLATE 10 MG PO TABS
10.0000 mg | ORAL_TABLET | Freq: Every day | ORAL | Status: DC
  Administered 2013-10-07 – 2013-10-15 (×9): 10 mg via ORAL
  Filled 2013-10-07 (×9): qty 1

## 2013-10-07 MED ORDER — FAT EMULSION 20 % IV EMUL
250.0000 mL | INTRAVENOUS | Status: AC
  Administered 2013-10-07: 250 mL via INTRAVENOUS
  Filled 2013-10-07: qty 250

## 2013-10-07 MED ORDER — KETOROLAC TROMETHAMINE 30 MG/ML IJ SOLN
30.0000 mg | Freq: Four times a day (QID) | INTRAMUSCULAR | Status: AC | PRN
  Administered 2013-10-07: 30 mg via INTRAVENOUS
  Filled 2013-10-07: qty 1

## 2013-10-07 MED ORDER — DIPHENOXYLATE-ATROPINE 2.5-0.025 MG PO TABS
1.0000 | ORAL_TABLET | Freq: Four times a day (QID) | ORAL | Status: DC
  Administered 2013-10-07 – 2013-10-15 (×34): 1 via ORAL
  Filled 2013-10-07 (×32): qty 1

## 2013-10-07 MED ORDER — TRACE MINERALS CR-CU-F-FE-I-MN-MO-SE-ZN IV SOLN
INTRAVENOUS | Status: AC
  Administered 2013-10-07: 18:00:00 via INTRAVENOUS
  Filled 2013-10-07: qty 3000

## 2013-10-07 NOTE — Progress Notes (Signed)
Physical Therapy Treatment Patient Details Name: Kurt Cross MRN: [id number] DOB: 03/22/1951 Today's Date: 10/07/2013 Time: 1120-1150 PT Time Calculation (min): 30 min  PT Assessment / Plan / Recommendation  History of Present Illness Kurt Cross is a 63 y.o. male  with known history of chronic systolic heart failure status post defibrillator placement last EF measured was in 2013 was 35-40%, on home oxygen, OSA noncompliant with CPAP, diabetes mellitus, hypertension and hyperlipidemia presented to the ER because right leg pain after sustaining a fall the night prior to admission.  08/14/13: s/p abdominal abscess removal   PT Comments   Pt amb around 1/2 unit today due to increased c/o fatigue.  Follow Up Recommendations  SNF     Does the patient have the potential to tolerate intense rehabilitation     Barriers to Discharge        Equipment Recommendations       Recommendations for Other Services    Frequency Min 2X/week   Progress towards PT Goals Progress towards PT goals: Progressing toward goals  Plan      Precautions / Restrictions Precautions Precautions: Fall Precaution Comments: ostomy bag on R side and wall suction to ABD Restrictions Weight Bearing Restrictions: No    Pertinent Vitals/Pain No c/o pain    Mobility  Bed Mobility Bed Mobility: Not assessed Details for Bed Mobility Assistance: Pt OOB in recliner Transfers Transfers: Sit to Stand;Stand to Sit Sit to Stand: 5: Supervision;4: Min guard;From chair/3-in-1 Stand to Sit: 5: Supervision;4: Min guard Details for Transfer Assistance: pt uses momentum forward motion to stand and tends to "plop" uncontrolled with sitting Ambulation/Gait Ambulation/Gait Assistance: 4: Min guard Ambulation Distance (Feet): 250 Feet (100 feet, 80 feet, 130 feet with 2 sitting rest breaks) Assistive device: Rolling walker Ambulation/Gait Assistance Details: increased time and 2 sitting rest breaks Gait Pattern: Step-through  pattern;Wide base of support;Decreased stride length;Ataxic Gait velocity: decreased     PT Goals (current goals can now be found in the care plan section)    Visit Information  Last PT Received On: 10/07/13 History of Present Illness: Kurt Cross is a 63 y.o. male  with known history of chronic systolic heart failure status post defibrillator placement last EF measured was in 2013 was 35-40%, on home oxygen, OSA noncompliant with CPAP, diabetes mellitus, hypertension and hyperlipidemia presented to the ER because right leg pain after sustaining a fall the night prior to admission.  08/14/13: s/p abdominal abscess removal    Subjective Data      Cognition       Balance     End of Session PT - End of Session Equipment Utilized During Treatment: Gait belt Activity Tolerance: Patient tolerated treatment well Patient left: in chair;with call bell/phone within reach   Rica Koyanagi  PTA [facility]  Acute  Rehab Pager      [phone]

## 2013-10-07 NOTE — Progress Notes (Signed)
PARENTERAL NUTRITION CONSULT NOTE - FOLLOW UP  Pharmacy Consult for TNA Indication: Enterocutaneous fistula  No Known Allergies  Patient Measurements: Height: 6' (182.9 cm) Weight: 393 lb 11.9 oz (178.6 kg) IBW/kg (Calculated) : 77.6 Adjusted Body Weight: 102.6kg  Vital Signs: Temp: 98.3 F (36.8 C) (01/01 0542) Temp src: Oral (01/01 0542) BP: 162/77 mmHg (01/01 0542) Pulse Rate: 79 (01/01 0542) Intake/Output from previous day: 12/31 0701 - 01/01 0700 In: 1860 [I.V.:240; TPN:1620] Out: 2250 [Urine:2050; Drains:200]  Labs: No results found for this basename: WBC, HGB, HCT, PLT, APTT, INR,  in the last 72 hours  Recent Labs  10/07/13 0330  NA 135*  K 3.6*  CL 98  CO2 28  GLUCOSE 133*  BUN 18  CREATININE 0.85  CALCIUM 9.0  MG 1.5  PHOS 4.9*  PROT 6.9  ALBUMIN 2.2*  AST 37  ALT 51  ALKPHOS 69  BILITOT 0.9  Calcium 10.4  Recent Labs  10/06/13 1955 10/06/13 2334 10/07/13 0427  GLUCAP 144* 113* 133*   Insulin Requirements in the past 24 hours:   CBGs < 150, required 10 units of moderate-scale SSI   TNA contains regular insulin 50 units per 3L bag (16.6 units/L, increased on 12/24)  Lantus 30 units QHS  Nutritional Goals:   Goals per RD 12/30:  2370-2650 kcal/day, 160-180 grams/day protein, 3.3-3.5 L/day fluid.  Clinimix E 5/15 at goal rate 125 ml/hr (3L/day) with 20% lipids at 10 ml/hr to provide 150g protein and 2675 kcal daily.  Due to TNA bag capacity, maximum volume is 3L per 24 hrs.  This rate will meet >/= 90% of estimated needs.  Current Nutrition:   NPO except for ice chips, sips with meds  Clinimix E 5/15 at 164ml/hr and 20% lipids at 10 ml/hr  IVF: NS at [place]: 63 yo male with large abdominal abscess with infected old Gore Tex mesh s/p I&D abscess, removal of mesh, debridement of abdominal wall and peritoneum. 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 TNA started 11/12.  Patient currently unable to be discharged to a  facility nearby that can accommodate both TNA and wound drainage system.   On 12/21, [facility] requested re-trying cyclic TNA, but because there is no advantage for discharge planning and pt has increased risks of fluid overload and hypoglycemia, we will continue with 24 hr continuous TPN.  1/1:  Day #50 TNA. Pt remains NPO and tolerating 24 hour continuous TPN.  Empiric abx changed to Cipro 12/27. Patient needs LTAC at discharge, awaiting bed availability at [facility] as patient refusing to got to [facility] in [place]. No significant changes with nutritional therapy in past 24 hours  Labs:   Glucose: Hx DM, on insulin PTA. CBGs are improved, < 150 after increasing Novolog to moderate-scale SSI q4h 12/30. Continues Lantus qhs and insulin in TNA as outlined above.     Electrolytes: K = 3.6 (low-end normal) - on KCl 51meq day,  Phos = 4.9 (slightly elevated), Corr Ca = 10.44 (Corr Ca x Phos= 51)  Renal:  SCr WNL on 12/29, stable, weight change of + 26kg documented 12/30 to 12/31  LFTs: WNL 1/1  TGs: WNL on 12/29   Prealbumin: 10.3 (11/13), 11.1 (11/17), 11.2(11/24), 13.6 (12/1), 10.3 (12/8), 13.4 (12/15), 15.4 (12/22), 14.4 (12/29) - slightly low, but improved from baseline  Plan:   Continue Clinimix E 5/15 at 125 ml/hr with lipids 20% at 10 ml/hr over 24 hours.     Continue insulin 50 units per 3L  bag (16.6 units/L)  TNA to contain standard multivitamins and trace elements.  Continue CBG checks q4h, moderate SSI coverage .   Continue Lantus 30 units QHS.  KCl 28mEq PO daily per MD  Ask to re-weigh patient to see if +26kg accurate from 12/31  Continue NS at [facility]  TNA lab panels on Mondays & Thursdays   Monitor Phos  F/U discharge plans and TNA arrangement.  Doreene Eland, PharmD, BCPS.   Pager: [phone] 6:50 AM 10/07/2013

## 2013-10-07 NOTE — Progress Notes (Addendum)
[facility] PROGRESS NOTE   Kurt Cross [id number] DOB: 01/07/1951 DOA: 08/11/2013 PCP: [name], MD  Brief narrative: 63 year old gentleman with a PMH of chronic systolic CHF, status post AICD, obesity, OSA, type 2 DM, recovering from a complicated abdominal wall abscess/infected Gore [place] mesh status post I&D/removal of mesh on 08/11/13 with hospital course complicated by the development of an entero-atmospheric fistula, status post Eakin's pouch, ongoing need for bowel rest to assist with healing, on TNA for nutrition.  Too medically complex for tx to SNF, and not approved for local LTAC.  Assessment/Plan: Principal Problem:   Entero-atmospheric fistula Continue bowel rest and Eakin's pouch to low suction. Surgery following. Active Problems:   Atrial fibrillation/flutter Likely triggered by infection. Back in normal sinus rhythm. Cardiac markers negative x3. EF 55% on most recent echocardiogram. Continue Coreg and Eliquis.  CHADSVASc score is 4.   Febrile illness Continue Cipro through 10/08/13. Recently treated for sepsis secondary to coagulase-negative Staphylococcus status post 2 weeks of vancomycin. Blood cultures done 09/29/2013 are negative.   Diabetes mellitus, insulin dependent (IDDM), uncontrolled Currently being managed with 30 units of Lantus daily as well as SSI, moderate scale every 4 hours. CBGs 113-144.   HYPERLIPIDEMIA  Continue Lipitor.   Obesity, Class III, BMI 40-49.9 (morbid obesity) Currently on TNA.   OBSTRUCTIVE SLEEP APNEA Continue CPAP each bedtime.   Hypertension Continue Norvasc, Imdur, Lasix, hydralazine and Coreg. Systolic blood pressure in the 160s. We'll increase Norvasc to 10 mg daily.   Acute-on-chronic respiratory failure secondary to probable community-acquired pneumonia Resolved. Chest x-rays done 09/28/2013 and 10/02/2013 negative for pneumonia.   Constipation, chronic Continue laxatives as needed.   Chronic combined systolic and  diastolic CHF (congestive heart failure) Last 2-D echocardiogram done 08/12/2013. EF 50-55%. Currently compensated.   Right leg pain Treating symptomatically with oxycodone as needed.   AAA (abdominal aortic aneurysm)/ 4.5 cm ascending per CT angio 08/01/13 Recommend close outpatient followup.   Infected prosthetic mesh of abdominal wall with GIANT abscess s/p removal 08/14/2013 Per surgery.   Recurrent ventral incisional hernia Per surgery.   Acute renal failure (ARF) Resolved.  Code Status: Full. Family Communication: No family at bedside. Disposition Plan: Unclear.  ? LTAC, ? SNF   IV access:  Triple lumen PICC placed 08/18/13   Anti-infectives:  Vancomycin 08/15/13>>> 08/31/13   Zosyn 08/14/13>>>08/31/13   Cipro 10/02/2013 >>>   HPI/Subjective: Kurt Cross complains of chronic stomach and leg pain, controlled with medicine.  No nausea or vomiting.  Ambulated again today.  Objective: Filed Vitals:   10/06/13 0605 10/06/13 1400 10/06/13 2225 10/07/13 0542  BP: 165/66 166/96 163/81 162/77  Pulse: 74 87 78 79  Temp: 99 F (37.2 C) 98 F (36.7 C) 97.9 F (36.6 C) 98.3 F (36.8 C)  TempSrc: Oral Oral Oral Oral  Resp: 18 18 18 18   Height:      Weight: 178.6 kg (393 lb 11.9 oz)   179.3 kg (395 lb 4.6 oz)  SpO2: 100% 100% 99% 100%    Intake/Output Summary (Last 24 hours) at 10/07/13 0834 Last data filed at 10/07/13 0551  Gross per 24 hour  Intake   1860 ml  Output   2250 ml  Net   -390 ml    Exam: Gen:  NAD Cardiovascular:  RRR, No M/R/G Respiratory:  Lungs diminished in the bases Gastrointestinal:  Abdomen obese, distended, Eakin's pouch with feculent drainage Extremities:  No C/E/C  Data Reviewed: Basic Metabolic Panel:  Recent Labs Lab  10/04/13 0430 10/07/13 0330  NA 136 135*  K 3.7 3.6*  CL 100 98  CO2 27 28  GLUCOSE 136* 133*  BUN 19 18  CREATININE 0.80 0.85  CALCIUM 9.0 9.0  MG 1.6 1.5  PHOS 4.1 4.9*   GFR Estimated Creatinine  Clearance: 154.6 ml/min (by C-G formula based on Cr of 0.85). Liver Function Tests:  Recent Labs Lab 10/04/13 0430 10/07/13 0330  AST 38* 37  ALT 56* 51  ALKPHOS 81 69  BILITOT 0.9 0.9  PROT 7.1 6.9  ALBUMIN 2.2* 2.2*    CBC:  Recent Labs Lab 09/30/13 1130 10/04/13 0430  WBC 7.8 8.0  NEUTROABS  --  4.4  HGB 9.8* 9.5*  HCT 29.6* 28.9*  MCV 92.2 92.3  PLT 266 265   BNP (last 3 results)  Recent Labs  08/01/13 1649  PROBNP 481.4*   CBG:  Recent Labs Lab 10/06/13 1212 10/06/13 1608 10/06/13 1955 10/06/13 2334 10/07/13 0427  GLUCAP 128* 124* 144* 113* 133*   Lipid Profile No results found for this basename: CHOL, HDL, LDLCALC, TRIG, CHOLHDL, LDLDIRECT,  in the last 72 hours Microbiology Recent Results (from the past 240 hour(s))  CULTURE, BLOOD (ROUTINE X 2)     Status: None   Collection Time    09/29/13  3:55 PM      Result Value Range Status   Specimen Description BLOOD LEFT ARM   Final   Special Requests BOTTLES DRAWN AEROBIC AND ANAEROBIC 10CC   Final   Culture  Setup Time     Final   Value: 09/29/2013 22:13     Performed at [place]     Final   Value: NO GROWTH 5 DAYS     Performed at [facility]   Report Status 10/05/2013 FINAL   Final     Procedures and Diagnostic Studies:  08/11/13: Dg hip complete right: IMPRESSION: No acute osseous injury of the right hip.  08/11/13: I&D of abscess, GoreTex mesh removal, debridement of abdominal wall and peritoneum done by Dr. Johney Maine.  08/12/13: 2 D Echo: EF 50-55%, regional wall motion abnormalities cannot be excluded.  08/13/13: Dg Chest Port 1 View: IMPRESSION: No acute findings. Stable cardiomegaly and pulmonary venous prominence without overt edema.  08/14/13: Lower Extremity Venous Duplex Bilateral: No obvious DVT bilaterally.  [name]'s cyst noted in right popliteal fossa.  08/14/13: CT Abdomen Pelvis Wo Contrast: IMPRESSION: 14.5 x 18.1 x 26.1 cm fluid collection/abscess  within the anterior abdominal wall, as described above, incompletely visualized. The collection appears just anterior to prior ventral hernia mesh. Given the size and proximity to the skin surface, incision and drainage is suggested.  08/15/13: Dg Chest Port 1 View: IMPRESSION: Stable cardiomegaly with interval worsening of pulmonary vascular congestion/ edema relative to 08/13/2013.  08/17/13: Dg Chest Port 1 View: IMPRESSION: Stable chest with changes of congestive heart failure with pulmonary interstitial edema. Cardiac pacer in stable position.  08/18/13: Dg Chest Port 1 View: IMPRESSION: 1. Right upper extremity PICC terminates in the mid superior vena cava. 2. Stable congestive heart failure pattern.  09/28/13: Dg Chest 2 View:  IMPRESSION: No active cardiopulmonary disease.    10/02/13: Dg Chest 1 View: IMPRESSION: 1. Right arm PICC tip projects over the lower SVC at or near the cavoatrial junction. 2. Stable cardiomegaly. Pulmonary venous hypertension without overt edema. No acute cardiopulmonary disease.      Scheduled Meds: . acetaminophen  1,000 mg Oral TID  . amLODipine  5 mg Oral Daily  . antiseptic oral rinse  15 mL Mouth Rinse q12n4p  . apixaban  5 mg Oral BID  . atorvastatin  40 mg Oral QODAY  . carvedilol  3.125 mg Oral BID WC  . ciprofloxacin  500 mg Oral BID  . clotrimazole   Topical BID  . diphenoxylate-atropine  1 tablet Oral QID  . ferrous sulfate  325 mg Oral TID  . furosemide  40 mg Oral Daily  . hydrALAZINE  50 mg Oral Q8H  . insulin aspart  0-15 Units Subcutaneous Q4H  . insulin glargine  30 Units Subcutaneous QHS  . isosorbide mononitrate  30 mg Oral Daily  . lip balm  1 application Topical BID  . neomycin-bacitracin-polymyxin   Topical BID  . OxyCODONE  15 mg Oral Q12H  . potassium chloride  40 mEq Oral Daily  . sodium chloride  3 mL Intravenous Q12H  . zolpidem  10 mg Oral QHS   Continuous Infusions: . sodium chloride 20 mL/hr (10/05/13 2036)  . [name] KitchenTPN  (CLINIMIX-E) Adult 125 mL/hr at 10/07/13 0700   And  . fat emulsion 250 mL (10/07/13 0700)  . [name] KitchenTPN (CLINIMIX-E) Adult     And  . fat emulsion      Time spent: 25 minutes.   LOS: 49 days   [facility] Pager [phone].   *Please note that the hospitalists switch teams on Wednesdays. Please call the flow manager at [phone] if you are having difficulty reaching the hospitalist taking care of this patient as she can update you and provide the most up-to-date pager number of provider caring for the patient. If 8PM-8AM, please contact night-coverage at [email or web address], password [email or web address]  10/07/2013, 8:34 AM

## 2013-10-07 NOTE — Progress Notes (Signed)
Kurt Cross [id number] [birth year]  CARE TEAM:  PCP: [name], MD  Outpatient Care Team: Patient Care Team: [name], MD as PCP - General (Internal Medicine) [name], MD as Consulting Physician (Cardiology) [name], MD as Consulting Physician (Pulmonary Disease)  Inpatient Treatment Team: Treatment Team: Attending Provider: Nolon Nations, MD; Consulting Physician: Nolon Nations, MD; Registered Nurse: Nadara Mode, RN; Registered Nurse: Thomos Lemons, Student-RN; Rounding Team: Threasa Beards, MD; Registered Nurse: Kristopher Oppenheim, RN; Technician: Leda Quail, NT; Registered Nurse: [name], RN; Physical Therapist: Nathanial Rancher, PTA; Registered Nurse: Emeterio Reeve, RN; Registered Nurse: Leitha Bleak, RN   Subjective:  No major events Denies abd pain Pouch w catch bag working Walking in hallways more  Objective:  Vital signs:  Filed Vitals:   10/06/13 0605 10/06/13 1400 10/06/13 2225 10/07/13 0542  BP: 165/66 166/96 163/81 162/77  Pulse: 74 87 78 79  Temp: 99 F (37.2 C) 98 F (36.7 C) 97.9 F (36.6 C) 98.3 F (36.8 C)  TempSrc: Oral Oral Oral Oral  Resp: $Remo'18 18 18 18  'FKgKo$ Height:      Weight: 393 lb 11.9 oz (178.6 kg)   395 lb 4.6 oz (179.3 kg)  SpO2: 100% 100% 99% 100%    Last BM Date: 10/05/13  Intake/Output   Yesterday:  12/31 0701 - 01/01 0700 In: 1860 [I.V.:240; TPN:1620] Out: 2250 [Urine:2050; Drains:200] This shift:     Bowel function:  Flatus: y, in bag  BM: y, thick brown in bag (533mL yest)  Drain: n/a  Physical Exam:  General: Pt awake/alert/oriented x4 in no acute distress Eyes: PERRL, normal EOM.  Sclera clear.  No icterus Neuro: CN II-XII intact w/o focal sensory/motor deficits. Lymph: No head/neck/groin lymphadenopathy Psych:  No delerium/psychosis/paranoia HENT: Normocephalic, Mucus membranes moist.  No thrush Neck: Supple, No tracheal deviation Chest: No chest wall pain w good  excursion CV:  Pulses intact.  Regular rhythm MS: Normal AROM mjr joints.  No obvious deformity Abdomen: Soft/obese.  Nondistended.  Central abd wound w pouch intact, tube catching output well.  No evidence of peritonitis.  No incarcerated hernias. Ext:  SCDs BLE.  No mjr edema.  No cyanosis Skin: No petechiae / purpura   Problem List:   Principal Problem:   Entero-atmospheric fistula Active Problems:   Infected prosthetic mesh of abdominal wall with GIANT abscess s/p removal 08/14/2013   Obesity, Class III, BMI 40-49.9 (morbid obesity)   Diabetes mellitus, insulin dependent (IDDM), uncontrolled   HYPERLIPIDEMIA   OBSTRUCTIVE SLEEP APNEA   Hypertension   Acute-on-chronic respiratory failure secondary to probable community-acquired pneumonia   Constipation, chronic   Chronic combined systolic and diastolic CHF (congestive heart failure)   Right leg pain   AAA (abdominal aortic aneurysm)/ 4.5 cm ascending per CT angio 08/01/13   Recurrent ventral incisional hernia   Acute renal failure (ARF)   Atrial fibrillation/flutter   Assessment  Kurt Cross  63 y.o. male  32 Days Post-Op  Procedure(s): INCISION AND DRAINAGE ABSCESS, mesh   EC fistula stabilizing  Plan:  -continue wound care with suctioned pouching system to control SB fistula - challenge for transition to outpy.  Perhaps switch to grav bag system off suction if output lowers & wound more closed/sealed w no more undermining  -TNA for high output SB fistula.  Retry cycling TNA?  -Max imodium & iron to slow output of fistula.  Add sched Lomotil.  No sandostatin - no  good evid for SB fistula  -doubt SB fistula will close w/o surgery.  D/w [facility] partners.  Agree w waiting until 6 months before attempting this - SB resection of fistulas would be needed.  Primary repair only will fail.  Massive fascial defect = need for major abd wall reconstruction probably w bilateral component separation & giant biologic  retrorectal/preoperitoneal mesh underlay.  Reserve polypropylene mesh for recurrence after fistula closed/healed.  Best managed at [employer] [facility] center [place], [facility], [facility], etc) with regular experience with this.  He does not specifically have a preference.  -VTE prophylaxis- SCDs, etc  -mobilize as tolerated to help recovery  I updated the patient's status to the pt.  Recommendations were made.  Questions were answered.  He expressed understanding & appreciation.    [name], M.D., F.A.C.S. Gastrointestinal and Minimally Invasive Surgery [facility] [address] [place], [place] [address] [phone] Main / Paging   10/07/2013   Results:   Labs: Results for orders placed during the hospital encounter of 08/11/13 (from the past 48 hour(s))  GLUCOSE, CAPILLARY     Status: Abnormal   Collection Time    10/05/13 12:17 PM      Result Value Range   Glucose-Capillary 205 (*) 70 - 99 mg/dL  GLUCOSE, CAPILLARY     Status: Abnormal   Collection Time    10/05/13  4:44 PM      Result Value Range   Glucose-Capillary 216 (*) 70 - 99 mg/dL  GLUCOSE, CAPILLARY     Status: Abnormal   Collection Time    10/05/13  8:07 PM      Result Value Range   Glucose-Capillary 174 (*) 70 - 99 mg/dL   Comment 1 Documented in Chart     Comment 2 Notify RN    GLUCOSE, CAPILLARY     Status: Abnormal   Collection Time    10/05/13 11:45 PM      Result Value Range   Glucose-Capillary 131 (*) 70 - 99 mg/dL   Comment 1 Documented in Chart     Comment 2 Notify RN    GLUCOSE, CAPILLARY     Status: Abnormal   Collection Time    10/06/13  3:58 AM      Result Value Range   Glucose-Capillary 139 (*) 70 - 99 mg/dL   Comment 1 Documented in Chart     Comment 2 Notify RN    GLUCOSE, CAPILLARY     Status: Abnormal   Collection Time    10/06/13  8:21 AM      Result Value Range   Glucose-Capillary 141 (*) 70 - 99 mg/dL  GLUCOSE, CAPILLARY     Status: Abnormal    Collection Time    10/06/13 12:12 PM      Result Value Range   Glucose-Capillary 128 (*) 70 - 99 mg/dL  GLUCOSE, CAPILLARY     Status: Abnormal   Collection Time    10/06/13  4:08 PM      Result Value Range   Glucose-Capillary 124 (*) 70 - 99 mg/dL  GLUCOSE, CAPILLARY     Status: Abnormal   Collection Time    10/06/13  7:55 PM      Result Value Range   Glucose-Capillary 144 (*) 70 - 99 mg/dL  GLUCOSE, CAPILLARY     Status: Abnormal   Collection Time    10/06/13 11:34 PM      Result Value Range   Glucose-Capillary 113 (*)  70 - 99 mg/dL  COMPREHENSIVE METABOLIC PANEL     Status: Abnormal   Collection Time    10/07/13  3:30 AM      Result Value Range   Sodium 135 (*) 137 - 147 mEq/L   Comment: Please note change in reference range.   Potassium 3.6 (*) 3.7 - 5.3 mEq/L   Comment: Please note change in reference range.   Chloride 98  96 - 112 mEq/L   CO2 28  19 - 32 mEq/L   Glucose, Bld 133 (*) 70 - 99 mg/dL   BUN 18  6 - 23 mg/dL   Creatinine, Ser 0.85  0.50 - 1.35 mg/dL   Calcium 9.0  8.4 - 10.5 mg/dL   Total Protein 6.9  6.0 - 8.3 g/dL   Albumin 2.2 (*) 3.5 - 5.2 g/dL   AST 37  0 - 37 U/L   ALT 51  0 - 53 U/L   Alkaline Phosphatase 69  39 - 117 U/L   Total Bilirubin 0.9  0.3 - 1.2 mg/dL   GFR calc non Af Amer >90  >90 mL/min   GFR calc Af Amer >90  >90 mL/min   Comment: (NOTE)     The eGFR has been calculated using the CKD EPI equation.     This calculation has not been validated in all clinical situations.     eGFR's persistently <90 mL/min signify possible Chronic Kidney     Disease.  MAGNESIUM     Status: None   Collection Time    10/07/13  3:30 AM      Result Value Range   Magnesium 1.5  1.5 - 2.5 mg/dL  PHOSPHORUS     Status: Abnormal   Collection Time    10/07/13  3:30 AM      Result Value Range   Phosphorus 4.9 (*) 2.3 - 4.6 mg/dL  GLUCOSE, CAPILLARY     Status: Abnormal   Collection Time    10/07/13  4:27 AM      Result Value Range   Glucose-Capillary  133 (*) 70 - 99 mg/dL    Imaging / Studies: No results found.  Medications / Allergies: per chart  Antibiotics: Anti-infectives   Start     Dose/Rate Route Frequency Ordered Stop   10/02/13 1600  ciprofloxacin (CIPRO) tablet 500 mg     500 mg Oral 2 times daily 10/02/13 1340     09/29/13 0000  vancomycin (VANCOCIN) 1,500 mg in sodium chloride 0.9 % 500 mL IVPB  Status:  Discontinued     1,500 mg 250 mL/hr over 120 Minutes Intravenous Every 12 hours 09/28/13 1115 10/02/13 1339   09/28/13 1200  piperacillin-tazobactam (ZOSYN) IVPB 3.375 g  Status:  Discontinued     3.375 g 12.5 mL/hr over 240 Minutes Intravenous 3 times per day 09/28/13 1113 10/02/13 1339   09/28/13 1200  vancomycin (VANCOCIN) 2,500 mg in sodium chloride 0.9 % 500 mL IVPB     2,500 mg 250 mL/hr over 120 Minutes Intravenous  Once 09/28/13 1115 09/28/13 1412   08/31/13 0800  vancomycin (VANCOCIN) 1,750 mg in sodium chloride 0.9 % 500 mL IVPB  Status:  Discontinued     1,750 mg 250 mL/hr over 120 Minutes Intravenous Every 24 hours 08/31/13 0753 08/31/13 1417   08/16/13 0800  vancomycin (VANCOCIN) 2,000 mg in sodium chloride 0.9 % 500 mL IVPB  Status:  Discontinued     2,000 mg 250 mL/hr over 120 Minutes Intravenous Every 24  hours 08/15/13 1411 08/31/13 0753   08/15/13 1500  vancomycin (VANCOCIN) 2,000 mg in sodium chloride 0.9 % 500 mL IVPB     2,000 mg 250 mL/hr over 120 Minutes Intravenous  Once 08/15/13 1411 08/15/13 1713   08/14/13 1600  piperacillin-tazobactam (ZOSYN) IVPB 3.375 g  Status:  Discontinued     3.375 g 12.5 mL/hr over 240 Minutes Intravenous Every 8 hours 08/14/13 1543 08/31/13 1417       Note: This dictation was prepared with Dragon/digital dictation along with [employer]. Any transcriptional errors that result from this process are unintentional.

## 2013-10-08 LAB — GLUCOSE, CAPILLARY
GLUCOSE-CAPILLARY: 145 mg/dL — AB (ref 70–99)
Glucose-Capillary: 125 mg/dL — ABNORMAL HIGH (ref 70–99)
Glucose-Capillary: 166 mg/dL — ABNORMAL HIGH (ref 70–99)
Glucose-Capillary: 147 mg/dL — ABNORMAL HIGH (ref 70–99)
Glucose-Capillary: 166 mg/dL — ABNORMAL HIGH (ref 70–99)

## 2013-10-08 MED ORDER — FAT EMULSION 20 % IV EMUL
250.0000 mL | INTRAVENOUS | Status: AC
  Administered 2013-10-08: 250 mL via INTRAVENOUS
  Filled 2013-10-08: qty 250

## 2013-10-08 MED ORDER — M.V.I. ADULT IV INJ
INTRAVENOUS | Status: AC
  Administered 2013-10-08: 18:00:00 via INTRAVENOUS
  Filled 2013-10-08: qty 3000

## 2013-10-08 NOTE — Progress Notes (Signed)
CSW continues to follow to assist with d/c planning. Pt's needs cannot be met at SNF at this time. CSW will continue to follow. Werner Lean LCSW [phone]

## 2013-10-08 NOTE — Progress Notes (Signed)
ATTENDING ADDENDUM:  I personally reviewed patient's record, examined the patient, and formulated the following assessment and plan:  I was present for his dressing change today.  He has a loop of small bowel with fistula in the LLQ of his wound.  This is surrounded by granulation tissue within a 10 x 15cm wound.

## 2013-10-08 NOTE — Progress Notes (Signed)
[facility] PROGRESS NOTE   Kurt Cross [id number] DOB: [birth year] DOA: 08/11/2013 PCP: [name], MD  Brief narrative: 63 year old gentleman with a PMH of chronic systolic CHF, status post AICD, obesity, OSA, type 2 DM, recovering from a complicated abdominal wall abscess/infected Gore [place] mesh status post I&D/removal of mesh on 08/11/13 with hospital course complicated by the development of an entero-atmospheric fistula, status post Eakin's pouch, ongoing need for bowel rest to assist with healing, on TNA for nutrition.  Too medically complex for tx to SNF, and not approved for local LTAC.  Assessment/Plan: Principal Problem:   Entero-atmospheric fistula Continue bowel rest and Eakin's pouch to low suction. Surgery following. Active Problems:   Atrial fibrillation/flutter Likely triggered by infection. Back in normal sinus rhythm. Cardiac markers negative x3. EF 55% on most recent echocardiogram. Continue Coreg and Eliquis.  CHADSVASc score is 4.   Febrile illness Discontinue Cipro after today's doses. Recently treated for sepsis secondary to coagulase-negative Staphylococcus status post 2 weeks of vancomycin. Blood cultures done 09/29/2013 are negative.   Diabetes mellitus, insulin dependent (IDDM), uncontrolled Currently being managed with 30 units of Lantus daily as well as SSI, moderate scale every 4 hours. CBGs 92-161.   HYPERLIPIDEMIA  Continue Lipitor.   Obesity, Class III, BMI 40-49.9 (morbid obesity) Currently on TNA.   OBSTRUCTIVE SLEEP APNEA Continue CPAP each bedtime.   Hypertension Continue Norvasc, Imdur, Lasix, hydralazine and Coreg. Systolic blood pressure improved with increase of Norvasc to 10 mg daily.   Acute-on-chronic respiratory failure secondary to probable community-acquired pneumonia Resolved. Chest x-rays done 09/28/2013 and 10/02/2013 negative for pneumonia.   Constipation, chronic Continue laxatives as needed.   Chronic combined systolic and  diastolic CHF (congestive heart failure) Last 2-D echocardiogram done 08/12/2013. EF 50-55%. Currently compensated.   Right leg pain Treating symptomatically with oxycodone as needed.   AAA (abdominal aortic aneurysm)/ 4.5 cm ascending per CT angio 08/01/13 Recommend close outpatient followup.   Infected prosthetic mesh of abdominal wall with GIANT abscess s/p removal 08/14/2013 Per surgery.   Recurrent ventral incisional hernia Per surgery.   Acute renal failure (ARF) Resolved.  Code Status: Full. Family Communication: No family at bedside. Disposition Plan: Unclear.  ? LTAC, ? SNF   IV access:  Triple lumen PICC placed 08/18/13   Anti-infectives:  Vancomycin 08/15/13>>> 08/31/13   Zosyn 08/14/13>>>08/31/13   Cipro 10/02/2013 >>> 10/08/2013   HPI/Subjective: Kurt Cross complains of chronic stomach and leg pain, controlled with medicine.  No nausea or vomiting. Resting quietly.  No fever/chills.    Objective: Filed Vitals:   10/07/13 1427 10/07/13 2206 10/07/13 2358 10/08/13 0625  BP: 145/75 119/69  125/67  Pulse: 76 63  61  Temp: 98 F (36.7 C) 98 F (36.7 C)  97.5 F (36.4 C)  TempSrc: Oral Oral  Oral  Resp: 18 18 19 18   Height:      Weight:      SpO2: 100% 97%  97%    Intake/Output Summary (Last 24 hours) at 10/08/13 0737 Last data filed at 10/08/13 [id number]  Gross per 24 hour  Intake    700 ml  Output   3200 ml  Net  -2500 ml    Exam: Gen:  NAD Cardiovascular:  RRR, No M/R/G Respiratory:  Lungs diminished in the bases Gastrointestinal:  Abdomen obese, distended, Eakin's pouch with feculent drainage Extremities:  No C/E/C  Data Reviewed: Basic Metabolic Panel:  Recent Labs Lab 10/04/13 0430 10/07/13 0330  NA 136 135*  K 3.7 3.6*  CL 100 98  CO2 27 28  GLUCOSE 136* 133*  BUN 19 18  CREATININE 0.80 0.85  CALCIUM 9.0 9.0  MG 1.6 1.5  PHOS 4.1 4.9*   GFR Estimated Creatinine Clearance: 154.6 ml/min (by C-G formula based on Cr of  0.85). Liver Function Tests:  Recent Labs Lab 10/04/13 0430 10/07/13 0330  AST 38* 37  ALT 56* 51  ALKPHOS 81 69  BILITOT 0.9 0.9  PROT 7.1 6.9  ALBUMIN 2.2* 2.2*    CBC:  Recent Labs Lab 10/04/13 0430 10/07/13 1140  WBC 8.0 12.5*  NEUTROABS 4.4  --   HGB 9.5* 11.0*  HCT 28.9* 33.3*  MCV 92.3 93.0  PLT 265 376   BNP (last 3 results)  Recent Labs  08/01/13 1649  PROBNP 481.4*   CBG:  Recent Labs Lab 10/07/13 1207 10/07/13 1629 10/07/13 2017 10/07/13 2344 10/08/13 0340  GLUCAP 128* 161* 131* 92 125*   Lipid Profile No results found for this basename: CHOL, HDL, LDLCALC, TRIG, CHOLHDL, LDLDIRECT,  in the last 72 hours Microbiology Recent Results (from the past 240 hour(s))  CULTURE, BLOOD (ROUTINE X 2)     Status: None   Collection Time    09/29/13  3:55 PM      Result Value Range Status   Specimen Description BLOOD LEFT ARM   Final   Special Requests BOTTLES DRAWN AEROBIC AND ANAEROBIC 10CC   Final   Culture  Setup Time     Final   Value: 09/29/2013 22:13     Performed at [facility]     Final   Value: NO GROWTH 5 DAYS     Performed at [facility]   Report Status 10/05/2013 FINAL   Final     Procedures and Diagnostic Studies:  08/11/13: Dg hip complete right: IMPRESSION: No acute osseous injury of the right hip.  08/11/13: I&D of abscess, GoreTex mesh removal, debridement of abdominal wall and peritoneum done by Dr. Johney Maine.  08/12/13: 2 D Echo: EF 50-55%, regional wall motion abnormalities cannot be excluded.  08/13/13: Dg Chest Port 1 View: IMPRESSION: No acute findings. Stable cardiomegaly and pulmonary venous prominence without overt edema.  08/14/13: Lower Extremity Venous Duplex Bilateral: No obvious DVT bilaterally.  [name]'s cyst noted in right popliteal fossa.  08/14/13: CT Abdomen Pelvis Wo Contrast: IMPRESSION: 14.5 x 18.1 x 26.1 cm fluid collection/abscess within the anterior abdominal wall, as described above,  incompletely visualized. The collection appears just anterior to prior ventral hernia mesh. Given the size and proximity to the skin surface, incision and drainage is suggested.  08/15/13: Dg Chest Port 1 View: IMPRESSION: Stable cardiomegaly with interval worsening of pulmonary vascular congestion/ edema relative to 08/13/2013.  08/17/13: Dg Chest Port 1 View: IMPRESSION: Stable chest with changes of congestive heart failure with pulmonary interstitial edema. Cardiac pacer in stable position.  08/18/13: Dg Chest Port 1 View: IMPRESSION: 1. Right upper extremity PICC terminates in the mid superior vena cava. 2. Stable congestive heart failure pattern.  09/28/13: Dg Chest 2 View:  IMPRESSION: No active cardiopulmonary disease.    10/02/13: Dg Chest 1 View: IMPRESSION: 1. Right arm PICC tip projects over the lower SVC at or near the cavoatrial junction. 2. Stable cardiomegaly. Pulmonary venous hypertension without overt edema. No acute cardiopulmonary disease.      Scheduled Meds: . acetaminophen  1,000 mg Oral TID  . amLODipine  10 mg Oral Daily  . antiseptic oral  rinse  15 mL Mouth Rinse q12n4p  . apixaban  5 mg Oral BID  . atorvastatin  40 mg Oral QODAY  . carvedilol  3.125 mg Oral BID WC  . ciprofloxacin  500 mg Oral BID  . clotrimazole   Topical BID  . diphenoxylate-atropine  1 tablet Oral QID  . ferrous sulfate  325 mg Oral TID  . furosemide  40 mg Oral Daily  . hydrALAZINE  50 mg Oral Q8H  . insulin aspart  0-15 Units Subcutaneous Q4H  . insulin glargine  30 Units Subcutaneous QHS  . isosorbide mononitrate  30 mg Oral Daily  . lip balm  1 application Topical BID  . neomycin-bacitracin-polymyxin   Topical BID  . OxyCODONE  15 mg Oral Q12H  . potassium chloride  40 mEq Oral Daily  . sodium chloride  3 mL Intravenous Q12H  . zolpidem  10 mg Oral QHS   Continuous Infusions: . sodium chloride 20 mL/hr (10/05/13 2036)  . [name] KitchenTPN (CLINIMIX-E) Adult 125 mL/hr at 10/07/13 1741   And   . fat emulsion 250 mL (10/07/13 1742)    Time spent: 25 minutes.   LOS: 77 days   [facility] Pager [phone].   *Please note that the hospitalists switch teams on Wednesdays. Please call the flow manager at [phone] if you are having difficulty reaching the hospitalist taking care of this patient as she can update you and provide the most up-to-date pager number of provider caring for the patient. If 8PM-8AM, please contact night-coverage at [email or web address], password [facility]  10/08/2013, 7:37 AM

## 2013-10-08 NOTE — Consult Note (Addendum)
WOC wound follow-up consult note Reason for Consult: Eakin pouch changed with Dr Marcello Moores and Estill Bakes, PA  to assess abd wound and fistula and discuss further plan of care with the patient.  Wound 90% red, 10% yellow, no odor.  Fistula stoma unchanged from previous assessment; refer to [place] progress notes on 12/29.   Wound type: Full thickness abd wound with fistula at 5:00 o'clock, above wound bed. Measurement:16X10X1.3cm, decreasing amt of undermining to left wound edge as previously noted; depth is 2cm. Drainage (amount, consistency, odor) Continues to have large amt thick brown liquid output to 31mm wall suction Periwound:Wound edges and surrounding skin intact without maceration. Dressing procedure/placement/frequency:Applied Mepitel contact layer to wound bed, then picture-framed edges with Eakin seal and applied large Eakin pouch with spout towards the right abd.  Inserted red rubber catheter for wall suction.  Pt is very well-informed regarding the pouching process and is able to disconnect and replace suction for ambulation.  Instructions and supplies at bedside for staff use if leakage should occur.  Plan to change pouch on Wed since they have been maintaining seal for 5 days. Defer to [facility]CS service for further plan of care regarding suction or gravity drainage. Pt tolerated process with md amt discomfort and pain meds given upon request by the bedside nurse. He assisted with the pouch application process. Julien Girt MSN, RN, [place], [place], [place]

## 2013-10-08 NOTE — Progress Notes (Signed)
Patient ID: Kurt Cross, male   DOB: Feb 16, 1951, 63 y.o.   MRN: [id number]  Subjective: No changes C/o abdominal pain only with pouch changes.  On TPN.  No dysuria.  Denies chills or sweats.  Remains afebrile.  WBC did increase yesterday to 12.5k.  His blood pressure and CBGs are much better.    Objective:  Vital signs:  Filed Vitals:   10/07/13 2206 10/07/13 2358 10/08/13 0625 10/08/13 0858  BP: 119/69  125/67 131/67  Pulse: 63  61 71  Temp: 98 F (36.7 C)  97.5 F (36.4 C)   TempSrc: Oral  Oral   Resp: $Remo'18 19 18   'BUpqi$ Height:      Weight:      SpO2: 97%  97%     Last BM Date: 10/08/13  Intake/Output   Yesterday:  01/01 0701 - 01/02 0700 In: 700 [I.V.:160; TPN:540] Out: 3200 [Urine:2100; Drains:1100]  I/O last 3 completed shifts: In: 700 [I.V.:160] Out: 4350 [Urine:3250; Drains:1100]      Physical Exam:  General: Pt awake/alert/oriented x3 in no acute distress  Lungs: CTA  Cardio: S1S2 rrr, no murmurs gallops or rubs.  Abd: obese, soft, midline wound, fistula with brown output, on suction  Problem List:   Principal Problem:   Entero-atmospheric fistula Active Problems:   Diabetes mellitus, insulin dependent (IDDM), uncontrolled   HYPERLIPIDEMIA   Obesity, Class III, BMI 40-49.9 (morbid obesity)   OBSTRUCTIVE SLEEP APNEA   Hypertension   Acute-on-chronic respiratory failure secondary to probable community-acquired pneumonia   Constipation, chronic   Chronic combined systolic and diastolic CHF (congestive heart failure)   Right leg pain   AAA (abdominal aortic aneurysm)/ 4.5 cm ascending per CT angio 08/01/13   Infected prosthetic mesh of abdominal wall with GIANT abscess s/p removal 08/14/2013   Recurrent ventral incisional hernia   Acute renal failure (ARF)   Atrial fibrillation/flutter    Results:   Labs: Results for orders placed during the hospital encounter of 08/11/13 (from the past 48 hour(s))  GLUCOSE, CAPILLARY     Status: Abnormal   Collection Time    10/06/13 12:12 PM      Result Value Range   Glucose-Capillary 128 (*) 70 - 99 mg/dL  GLUCOSE, CAPILLARY     Status: Abnormal   Collection Time    10/06/13  4:08 PM      Result Value Range   Glucose-Capillary 124 (*) 70 - 99 mg/dL  GLUCOSE, CAPILLARY     Status: Abnormal   Collection Time    10/06/13  7:55 PM      Result Value Range   Glucose-Capillary 144 (*) 70 - 99 mg/dL  GLUCOSE, CAPILLARY     Status: Abnormal   Collection Time    10/06/13 11:34 PM      Result Value Range   Glucose-Capillary 113 (*) 70 - 99 mg/dL  COMPREHENSIVE METABOLIC PANEL     Status: Abnormal   Collection Time    10/07/13  3:30 AM      Result Value Range   Sodium 135 (*) 137 - 147 mEq/L   Comment: Please note change in reference range.   Potassium 3.6 (*) 3.7 - 5.3 mEq/L   Comment: Please note change in reference range.   Chloride 98  96 - 112 mEq/L   CO2 28  19 - 32 mEq/L   Glucose, Bld 133 (*) 70 - 99 mg/dL   BUN 18  6 - 23 mg/dL   Creatinine, Ser 0.85  0.50 - 1.35 mg/dL   Calcium 9.0  8.4 - 36.4 mg/dL   Total Protein 6.9  6.0 - 8.3 g/dL   Albumin 2.2 (*) 3.5 - 5.2 g/dL   AST 37  0 - 37 U/L   ALT 51  0 - 53 U/L   Alkaline Phosphatase 69  39 - 117 U/L   Total Bilirubin 0.9  0.3 - 1.2 mg/dL   GFR calc non Af Amer >90  >90 mL/min   GFR calc Af Amer >90  >90 mL/min   Comment: (NOTE)     The eGFR has been calculated using the CKD EPI equation.     This calculation has not been validated in all clinical situations.     eGFR's persistently <90 mL/min signify possible Chronic Kidney     Disease.  MAGNESIUM     Status: None   Collection Time    10/07/13  3:30 AM      Result Value Range   Magnesium 1.5  1.5 - 2.5 mg/dL  PHOSPHORUS     Status: Abnormal   Collection Time    10/07/13  3:30 AM      Result Value Range   Phosphorus 4.9 (*) 2.3 - 4.6 mg/dL  GLUCOSE, CAPILLARY     Status: Abnormal   Collection Time    10/07/13  4:27 AM      Result Value Range   Glucose-Capillary  133 (*) 70 - 99 mg/dL  CBC     Status: Abnormal   Collection Time    10/07/13 11:40 AM      Result Value Range   WBC 12.5 (*) 4.0 - 10.5 K/uL   RBC 3.58 (*) 4.22 - 5.81 MIL/uL   Hemoglobin 11.0 (*) 13.0 - 17.0 g/dL   HCT 86.0 (*) 39.7 - 75.8 %   MCV 93.0  78.0 - 100.0 fL   MCH 30.7  26.0 - 34.0 pg   MCHC 33.0  30.0 - 36.0 g/dL   RDW 90.9  10.4 - 10.7 %   Platelets 376  150 - 400 K/uL  GLUCOSE, CAPILLARY     Status: Abnormal   Collection Time    10/07/13 12:07 PM      Result Value Range   Glucose-Capillary 128 (*) 70 - 99 mg/dL   Comment 1 Notify RN    GLUCOSE, CAPILLARY     Status: Abnormal   Collection Time    10/07/13  4:29 PM      Result Value Range   Glucose-Capillary 161 (*) 70 - 99 mg/dL   Comment 1 Notify RN    GLUCOSE, CAPILLARY     Status: Abnormal   Collection Time    10/07/13  8:17 PM      Result Value Range   Glucose-Capillary 131 (*) 70 - 99 mg/dL  GLUCOSE, CAPILLARY     Status: None   Collection Time    10/07/13 11:44 PM      Result Value Range   Glucose-Capillary 92  70 - 99 mg/dL  GLUCOSE, CAPILLARY     Status: Abnormal   Collection Time    10/08/13  3:40 AM      Result Value Range   Glucose-Capillary 125 (*) 70 - 99 mg/dL  GLUCOSE, CAPILLARY     Status: Abnormal   Collection Time    10/08/13  7:52 AM      Result Value Range   Glucose-Capillary 145 (*) 70 - 99 mg/dL    Imaging / Studies: No  results found.  Medications / Allergies: per chart  Antibiotics: Anti-infectives   Start     Dose/Rate Route Frequency Ordered Stop   10/02/13 1600  ciprofloxacin (CIPRO) tablet 500 mg     500 mg Oral 2 times daily 10/02/13 1340 10/09/13 0759   09/29/13 0000  vancomycin (VANCOCIN) 1,500 mg in sodium chloride 0.9 % 500 mL IVPB  Status:  Discontinued     1,500 mg 250 mL/hr over 120 Minutes Intravenous Every 12 hours 09/28/13 1115 10/02/13 1339   09/28/13 1200  piperacillin-tazobactam (ZOSYN) IVPB 3.375 g  Status:  Discontinued     3.375 g 12.5 mL/hr over  240 Minutes Intravenous 3 times per day 09/28/13 1113 10/02/13 1339   09/28/13 1200  vancomycin (VANCOCIN) 2,500 mg in sodium chloride 0.9 % 500 mL IVPB     2,500 mg 250 mL/hr over 120 Minutes Intravenous  Once 09/28/13 1115 09/28/13 1412   08/31/13 0800  vancomycin (VANCOCIN) 1,750 mg in sodium chloride 0.9 % 500 mL IVPB  Status:  Discontinued     1,750 mg 250 mL/hr over 120 Minutes Intravenous Every 24 hours 08/31/13 0753 08/31/13 1417   08/16/13 0800  vancomycin (VANCOCIN) 2,000 mg in sodium chloride 0.9 % 500 mL IVPB  Status:  Discontinued     2,000 mg 250 mL/hr over 120 Minutes Intravenous Every 24 hours 08/15/13 1411 08/31/13 0753   08/15/13 1500  vancomycin (VANCOCIN) 2,000 mg in sodium chloride 0.9 % 500 mL IVPB     2,000 mg 250 mL/hr over 120 Minutes Intravenous  Once 08/15/13 1411 08/15/13 1713   08/14/13 1600  piperacillin-tazobactam (ZOSYN) IVPB 3.375 g  Status:  Discontinued     3.375 g 12.5 mL/hr over 240 Minutes Intravenous Every 8 hours 08/14/13 1543 08/31/13 1417      Assessment/Plan  1. Infected prosthetic mesh of abdominal wall with a large abscess s/p removal 08/14/2013 Dr. Michael Boston  2. 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 Imodium and lomotil maximized by Dr. Johney Maine.  On TPN; Ongoing fistula output .  3. Sepsis-resolved  4. Afib on BB and chronic anticoagulation with Apixaban (Eliquis)  5. Hx of chronic combined diastolic and systolic CHF-stable  6. Diabetes mellitus-on SSI, lantus.  CBGs are better.  Appreciate IM assistance.  7. Stage III chronic renal disease-stable  8. Hypertension-improved, appreciate IM assistance. 9. Hyperlipidemia  10. Obesity, Class III, BMI 40-49.9 (morbid obesity)  11. 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 Deconditioning-pt walking in hallways now   Plan: continue with suctioned pouching system, possibly transition to gravity when output decreases.  Wound/pouch changed today, next  change will be Wednesday.  The wound is healing well with less undermining.  Refer to [place] [facility].    Discussed with the patient putting system to gravity, he is concerned with leaking and wishes to hold off.  Continue with TPN, ambulating, bowel rest.   Long term plan per Dr. Johney Maine.  Goal is to wait 6 months and then attempt SBR of fistula if needed. Perhaps tertiary care facility.    Erby Pian, [facility] Surgery Pager [phone] Office [phone]  10/08/2013  10:07 AM

## 2013-10-08 NOTE — Progress Notes (Signed)
Physical Therapy Treatment Patient Details Name: DEWARREN ALAMOS MRN: [id number] DOB: 05-03-1951 Today's Date: 10/08/2013 Time: [id number] PT Time Calculation (min): 32 min  PT Assessment / Plan / Recommendation  History of Present Illness OSHER THU is a 63 y.o. male  with known history of chronic systolic heart failure status post defibrillator placement last EF measured was in 2013 was 35-40%, on home oxygen, OSA noncompliant with CPAP, diabetes mellitus, hypertension and hyperlipidemia presented to the ER because right leg pain after sustaining a fall the night prior to admission.  08/14/13: s/p abdominal abscess removal   PT Comments   Assisted pt OOB to amb in hallway twice with one sitting rest break.   Follow Up Recommendations  SNF     Does the patient have the potential to tolerate intense rehabilitation     Barriers to Discharge        Equipment Recommendations       Recommendations for Other Services    Frequency Min 2X/week   Progress towards PT Goals Progress towards PT goals: Progressing toward goals  Plan      Precautions / Restrictions Precautions Precautions: Fall Precaution Comments: ostomy bag on R side and wall suction to ABD Restrictions Weight Bearing Restrictions: No    Pertinent Vitals/Pain No c/o pain    Mobility  Bed Mobility Bed Mobility: Supine to Sit;Sitting - Scoot to Edge of Bed Supine to Sit: 6: Modified independent (Device/Increase time) Sitting - Scoot to Edge of Bed: 6: Modified independent (Device/Increase time) Details for Bed Mobility Assistance: increased time Transfers Transfers: Sit to Stand;Stand to Sit Sit to Stand: 5: Supervision;4: Min guard;From bed Stand to Sit: 5: Supervision;4: Min guard;To chair/3-in-1 Details for Transfer Assistance: pt uses momentum forward motion to stand and tends to "plop" uncontrolled with sitting Ambulation/Gait Ambulation/Gait Assistance: 4: Min guard Ambulation Distance (Feet): 150 Feet (75  feet x 2 one sittinunf rest break) Assistive device: Rolling walker Ambulation/Gait Assistance Details: increased time and one sitting rest break Gait Pattern: Step-through pattern;Wide base of support;Decreased stride length;Ataxic Gait velocity: decreased    PT Goals (current goals can now be found in the care plan section)    Visit Information  Last PT Received On: 10/08/13 History of Present Illness: ZAQUAN WHISLER is a 63 y.o. male  with known history of chronic systolic heart failure status post defibrillator placement last EF measured was in 2013 was 35-40%, on home oxygen, OSA noncompliant with CPAP, diabetes mellitus, hypertension and hyperlipidemia presented to the ER because right leg pain after sustaining a fall the night prior to admission.  08/14/13: s/p abdominal abscess removal    Subjective Data      Cognition       Balance     End of Session PT - End of Session Activity Tolerance: Patient tolerated treatment well Patient left: in chair;with call bell/phone within reach   Rica Koyanagi  PTA [facility]  Acute  Rehab Pager      [phone]

## 2013-10-08 NOTE — Progress Notes (Signed)
PARENTERAL NUTRITION CONSULT NOTE - FOLLOW UP  Pharmacy Consult for TNA Indication: Enterocutaneous fistula  No Known Allergies  Patient Measurements: Height: 6' (182.9 cm) Weight: 395 lb 4.6 oz (179.3 kg) IBW/kg (Calculated) : 77.6 Adjusted Body Weight: 102.6kg  Vital Signs: Temp: 97.5 F (36.4 C) (01/02 0625) Temp src: Oral (01/02 0625) BP: 125/67 mmHg (01/02 0625) Pulse Rate: 61 (01/02 0625) Intake/Output from previous day: 01/01 0701 - 01/02 0700 In: 700 [I.V.:160; TPN:540] Out: 3200 [Urine:2100; Drains:1100] Above numbers appear incomplete   Labs:  Recent Labs  10/07/13 1140  WBC 12.5*  HGB 11.0*  HCT 33.3*  PLT 376    Recent Labs  10/07/13 0330  NA 135*  K 3.6*  CL 98  CO2 28  GLUCOSE 133*  BUN 18  CREATININE 0.85  CALCIUM 9.0  MG 1.5  PHOS 4.9*  PROT 6.9  ALBUMIN 2.2*  AST 37  ALT 51  ALKPHOS 69  BILITOT 0.9  Calcium 10.4  Recent Labs  10/07/13 2344 10/08/13 0340 10/08/13 0752  GLUCAP 92 125* 145*   Insulin Requirements in the past 24 hours:   CBGs < 150; required 13 units of moderate-scale SSI. One value < 100 noted   TNA contains regular insulin 50 units per 3L bag (16.6 units/L, increased on 12/24)  Lantus 30 units QHS  Nutritional Goals:   Goals per RD 12/30:  2370-2650 kcal/day, 160-180 grams/day protein, 3.3-3.5 L/day fluid.  Clinimix E 5/15 at goal rate 125 ml/hr (3L/day) with 20% lipids at 10 ml/hr to provide 150g protein and 2675 kcal daily.  Due to TNA bag capacity, maximum volume is 3L per 24 hrs.  This rate will meet >/= 90% of estimated needs.  Current Nutrition:   NPO except for ice chips, sips with meds  Clinimix E 5/15 at 18ml/hr and 20% lipids at 10 ml/hr  IVF: NS at [place]: 63 yo male with large abdominal abscess with infected old Gore Tex mesh s/p I&D abscess, removal of mesh, debridement of abdominal wall and peritoneum. 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 TNA started 11/12.   Patient currently unable to be discharged to a facility nearby that can accommodate both TNA and wound drainage system.   On 12/21, [facility] requested re-trying cyclic TNA, but because there is no advantage for discharge planning and pt has increased risks of fluid overload and hypoglycemia, we will continue with 24 hr continuous TPN.  1/2:  Day #51 TNA. Pt remains NPO on 24 hour continuous TPN.  Patient needs LTAC at discharge, awaiting bed availability at [facility] as patient refusing to got to [facility] in [place].    Possibility of re-trying cyclic administration was mentioned 1/1 - because patient had a CBG < 100 last night on continuousTNA, am concerned that hypoglycemia would recur if cycling is re-attempted. (If cycling the formula would facilitate discharge we could reconsider but on last report that was not the case).   Labs:   Glucose: Hx DM, on insulin PTA. CBGs < 150 after increasing Novolog to moderate-scale SSI q4h 12/30. Continues Lantus qhs and insulin in TNA as outlined above.   One CBG value < 100 last night - will watch.  Electrolytes: WNL on 1/1 except Phos = 4.9 (slightly elevated), Corr Ca x Phos= 51.    Renal:  SCr WNL on 1/1.LFTs: WNL 1/1  TGs: WNL on 12/29   Prealbumin: 10.3 (11/13), 11.1 (11/17), 11.2(11/24), 13.6 (12/1), 10.3 (12/8), 13.4 (12/15), 15.4 (12/22), 14.4 (12/29) - slightly low,  but improved from baseline  Weight change of + 26kg recorded 12/30 to 12/31, but does not correlate with I/O and pt comfortable lying flat - suspect measurement artifact of some type.  Plan:   Continue Clinimix E 5/15 at 125 ml/hr with lipids 20% at 10 ml/hr over 24 hours.     Continue insulin 50 units per 3L bag (16.6 units/L)  TNA to contain standard multivitamins and trace elements.  Continue CBG checks q4h, moderate SSI coverage .   Continue Lantus 30 units QHS.  KCl 64mEq PO daily per MD  Continue NS at [facility]  TNA lab panels on Mondays & Thursdays   Monitor  Phos  F/U discharge plans and TNA arrangement.  Clayburn Pert, PharmD, BCPS Pager: [phone] 10/08/2013  8:32 AM

## 2013-10-09 LAB — GLUCOSE, CAPILLARY
GLUCOSE-CAPILLARY: 131 mg/dL — AB (ref 70–99)
GLUCOSE-CAPILLARY: 134 mg/dL — AB (ref 70–99)
GLUCOSE-CAPILLARY: 138 mg/dL — AB (ref 70–99)
Glucose-Capillary: 113 mg/dL — ABNORMAL HIGH (ref 70–99)
Glucose-Capillary: 130 mg/dL — ABNORMAL HIGH (ref 70–99)
Glucose-Capillary: 136 mg/dL — ABNORMAL HIGH (ref 70–99)
Glucose-Capillary: 140 mg/dL — ABNORMAL HIGH (ref 70–99)

## 2013-10-09 MED ORDER — FAT EMULSION 20 % IV EMUL
250.0000 mL | INTRAVENOUS | Status: AC
  Administered 2013-10-09: 250 mL via INTRAVENOUS
  Filled 2013-10-09: qty 250

## 2013-10-09 MED ORDER — TRACE MINERALS CR-CU-F-FE-I-MN-MO-SE-ZN IV SOLN
INTRAVENOUS | Status: AC
  Administered 2013-10-09: 18:00:00 via INTRAVENOUS
  Filled 2013-10-09: qty 3000

## 2013-10-09 NOTE — Progress Notes (Signed)
Pt has refused CPAP for the night, RT to monitor and assess as needed.  

## 2013-10-09 NOTE — Progress Notes (Signed)
56 Days Post-Op  Subjective: No complaints Fairly comfortable  Objective: Vital signs in last 24 hours: Temp:  [97.6 F (36.4 C)-98 F (36.7 C)] 97.6 F (36.4 C) (01/03 0615) Pulse Rate:  [61-84] 62 (01/03 0615) Resp:  [18-19] 18 (01/03 0615) BP: (131-178)/(67-87) 142/87 mmHg (01/03 0615) SpO2:  [96 %-100 %] 100 % (01/03 0615) Weight:  [393 lb 1.3 oz (178.3 kg)] 393 lb 1.3 oz (178.3 kg) (01/03 0615) Last BM Date: 10/08/13  Intake/Output from previous day: 01/02 0701 - 01/03 0700 In: 0  Out: 2050 [Urine:2050] Intake/Output this shift:    Abdomen soft, pouch in place, minimal tenderness I helped get the suction unclogged  Lab Results:   Recent Labs  10/07/13 1140  WBC 12.5*  HGB 11.0*  HCT 33.3*  PLT 376   BMET  Recent Labs  10/07/13 0330  NA 135*  K 3.6*  CL 98  CO2 28  GLUCOSE 133*  BUN 18  CREATININE 0.85  CALCIUM 9.0   PT/INR No results found for this basename: LABPROT, INR,  in the last 72 hours ABG No results found for this basename: PHART, PCO2, PO2, HCO3,  in the last 72 hours  Studies/Results: No results found.  Anti-infectives: Anti-infectives   Start     Dose/Rate Route Frequency Ordered Stop   10/02/13 1600  ciprofloxacin (CIPRO) tablet 500 mg     500 mg Oral 2 times daily 10/02/13 1340 10/08/13 2014   09/29/13 0000  vancomycin (VANCOCIN) 1,500 mg in sodium chloride 0.9 % 500 mL IVPB  Status:  Discontinued     1,500 mg 250 mL/hr over 120 Minutes Intravenous Every 12 hours 09/28/13 1115 10/02/13 1339   09/28/13 1200  piperacillin-tazobactam (ZOSYN) IVPB 3.375 g  Status:  Discontinued     3.375 g 12.5 mL/hr over 240 Minutes Intravenous 3 times per day 09/28/13 1113 10/02/13 1339   09/28/13 1200  vancomycin (VANCOCIN) 2,500 mg in sodium chloride 0.9 % 500 mL IVPB     2,500 mg 250 mL/hr over 120 Minutes Intravenous  Once 09/28/13 1115 09/28/13 1412   08/31/13 0800  vancomycin (VANCOCIN) 1,750 mg in sodium chloride 0.9 % 500 mL IVPB   Status:  Discontinued     1,750 mg 250 mL/hr over 120 Minutes Intravenous Every 24 hours 08/31/13 0753 08/31/13 1417   08/16/13 0800  vancomycin (VANCOCIN) 2,000 mg in sodium chloride 0.9 % 500 mL IVPB  Status:  Discontinued     2,000 mg 250 mL/hr over 120 Minutes Intravenous Every 24 hours 08/15/13 1411 08/31/13 0753   08/15/13 1500  vancomycin (VANCOCIN) 2,000 mg in sodium chloride 0.9 % 500 mL IVPB     2,000 mg 250 mL/hr over 120 Minutes Intravenous  Once 08/15/13 1411 08/15/13 1713   08/14/13 1600  piperacillin-tazobactam (ZOSYN) IVPB 3.375 g  Status:  Discontinued     3.375 g 12.5 mL/hr over 240 Minutes Intravenous Every 8 hours 08/14/13 1543 08/31/13 1417      Assessment/Plan: s/p Procedure(s): INCISION AND DRAINAGE ABSCESS, mesh  (N/A)  Continue bowel rest TNA for nutritional support Wound care, drainage output monitoring  LOS: 59 days    Mateja Dier A 10/09/2013

## 2013-10-09 NOTE — Progress Notes (Signed)
Placed patient on CPAP via full face mask auto titrate settings with 3 lpm O2 bleed in.  Patient tolerating well at this time. RN aware.

## 2013-10-09 NOTE — Progress Notes (Signed)
[facility] PROGRESS NOTE   Kurt Cross [id number] DOB: [birth year] DOA: 08/11/2013 PCP: [name], MD  Brief narrative: 63 year old gentleman with a PMH of chronic systolic CHF, status post AICD, obesity, OSA, type 2 DM, recovering from a complicated abdominal wall abscess/infected Gore Tex mesh status post I&D/removal of mesh on 08/11/13 with hospital course complicated by the development of an entero-atmospheric fistula, status post Eakin's pouch, ongoing need for bowel rest to assist with healing, on TNA for nutrition.  Too medically complex for tx to SNF, and not approved for local LTAC.  Assessment/Plan: Principal Problem:   Entero-atmospheric fistula Continue bowel rest and Eakin's pouch to low suction. Surgery following. Active Problems:   Atrial fibrillation/flutter Likely triggered by infection. Back in normal sinus rhythm. Cardiac markers negative x3. EF 55% on most recent echocardiogram. Continue Coreg and Eliquis.  CHADSVASc score is 4.   Febrile illness Off antibiotics now. Recently treated for sepsis secondary to coagulase-negative Staphylococcus status post 2 weeks of vancomycin and Cipro x 7 days. Blood cultures done 09/29/2013 are negative.   Diabetes mellitus, insulin dependent (IDDM), uncontrolled Currently being managed with 30 units of Lantus daily as well as SSI, moderate scale every 4 hours. CBGs 130-166.   HYPERLIPIDEMIA  Continue Lipitor.   Obesity, Class III, BMI 40-49.9 (morbid obesity) Currently on TNA.   OBSTRUCTIVE SLEEP APNEA Continue CPAP each bedtime.   Hypertension Continue Norvasc, Imdur, Lasix, hydralazine and Coreg. Systolic blood pressure improved with increase of Norvasc to 10 mg daily.   Acute-on-chronic respiratory failure secondary to probable community-acquired pneumonia Resolved. Chest x-rays done 09/28/2013 and 10/02/2013 negative for pneumonia.   Constipation, chronic Continue laxatives as needed.   Chronic combined systolic  and diastolic CHF (congestive heart failure) Last 2-D echocardiogram done 08/12/2013. EF 50-55%. Currently compensated.   Right leg pain Treating symptomatically with oxycodone as needed.   AAA (abdominal aortic aneurysm)/ 4.5 cm ascending per CT angio 08/01/13 Recommend close outpatient followup.   Infected prosthetic mesh of abdominal wall with GIANT abscess s/p removal 08/14/2013 Per surgery.   Recurrent ventral incisional hernia Per surgery.   Acute renal failure (ARF) Resolved.  Code Status: Full. Family Communication: No family at bedside. Disposition Plan: Unclear.  ? LTAC, ? SNF   IV access:  Triple lumen PICC placed 08/18/13   Anti-infectives:  Vancomycin 08/15/13>>> 08/31/13   Zosyn 08/14/13>>>08/31/13   Cipro 10/02/2013 >>> 10/08/2013   HPI/Subjective: Kurt Cross is sitting up in the chair.  Denies nausea/vomiting.  Pain controlled.  No new complaints.    Objective: Filed Vitals:   10/08/13 1645 10/08/13 2147 10/09/13 0016 10/09/13 0615  BP: 157/84 161/87  142/87  Pulse: 84 67 62 62  Temp:  98 F (36.7 C)  97.6 F (36.4 C)  TempSrc:  Oral  Oral  Resp:  18 19 18   Height:      Weight:    178.3 kg (393 lb 1.3 oz)  SpO2:  98% 96% 100%    Intake/Output Summary (Last 24 hours) at 10/09/13 0855 Last data filed at 10/09/13 0600  Gross per 24 hour  Intake      0 ml  Output   2050 ml  Net  -2050 ml    Exam: Gen:  NAD Cardiovascular:  RRR, No M/R/G Respiratory:  Lungs diminished in the bases Gastrointestinal:  Abdomen obese, distended, Eakin's pouch with feculent drainage Extremities:  No C/E/C  Data Reviewed: Basic Metabolic Panel:  Recent Labs Lab 10/04/13 0430 10/07/13 0330  NA  136 135*  K 3.7 3.6*  CL 100 98  CO2 27 28  GLUCOSE 136* 133*  BUN 19 18  CREATININE 0.80 0.85  CALCIUM 9.0 9.0  MG 1.6 1.5  PHOS 4.1 4.9*   GFR Estimated Creatinine Clearance: 154.1 ml/min (by C-G formula based on Cr of 0.85). Liver Function Tests:  Recent  Labs Lab 10/04/13 0430 10/07/13 0330  AST 38* 37  ALT 56* 51  ALKPHOS 81 69  BILITOT 0.9 0.9  PROT 7.1 6.9  ALBUMIN 2.2* 2.2*    CBC:  Recent Labs Lab 10/04/13 0430 10/07/13 1140  WBC 8.0 12.5*  NEUTROABS 4.4  --   HGB 9.5* 11.0*  HCT 28.9* 33.3*  MCV 92.3 93.0  PLT 265 376   BNP (last 3 results)  Recent Labs  08/01/13 1649  PROBNP 481.4*   CBG:  Recent Labs Lab 10/08/13 1551 10/08/13 2004 10/09/13 0004 10/09/13 0413 10/09/13 0732  GLUCAP 147* 166* 130* 134* 138*   Lipid Profile No results found for this basename: CHOL, HDL, LDLCALC, TRIG, CHOLHDL, LDLDIRECT,  in the last 72 hours Microbiology Recent Results (from the past 240 hour(s))  CULTURE, BLOOD (ROUTINE X 2)     Status: None   Collection Time    09/29/13  3:55 PM      Result Value Range Status   Specimen Description BLOOD LEFT ARM   Final   Special Requests BOTTLES DRAWN AEROBIC AND ANAEROBIC 10CC   Final   Culture  Setup Time     Final   Value: 09/29/2013 22:13     Performed at [place]     Final   Value: NO GROWTH 5 DAYS     Performed at [facility]   Report Status 10/05/2013 FINAL   Final     Procedures and Diagnostic Studies:  08/11/13: Dg hip complete right: IMPRESSION: No acute osseous injury of the right hip.  08/11/13: I&D of abscess, GoreTex mesh removal, debridement of abdominal wall and peritoneum done by Dr. Johney Maine.  08/12/13: 2 D Echo: EF 50-55%, regional wall motion abnormalities cannot be excluded.  08/13/13: Dg Chest Port 1 View: IMPRESSION: No acute findings. Stable cardiomegaly and pulmonary venous prominence without overt edema.  08/14/13: Lower Extremity Venous Duplex Bilateral: No obvious DVT bilaterally.  [name]'s cyst noted in right popliteal fossa.  08/14/13: CT Abdomen Pelvis Wo Contrast: IMPRESSION: 14.5 x 18.1 x 26.1 cm fluid collection/abscess within the anterior abdominal wall, as described above, incompletely visualized. The  collection appears just anterior to prior ventral hernia mesh. Given the size and proximity to the skin surface, incision and drainage is suggested.  08/15/13: Dg Chest Port 1 View: IMPRESSION: Stable cardiomegaly with interval worsening of pulmonary vascular congestion/ edema relative to 08/13/2013.  08/17/13: Dg Chest Port 1 View: IMPRESSION: Stable chest with changes of congestive heart failure with pulmonary interstitial edema. Cardiac pacer in stable position.  08/18/13: Dg Chest Port 1 View: IMPRESSION: 1. Right upper extremity PICC terminates in the mid superior vena cava. 2. Stable congestive heart failure pattern.  09/28/13: Dg Chest 2 View:  IMPRESSION: No active cardiopulmonary disease.    10/02/13: Dg Chest 1 View: IMPRESSION: 1. Right arm PICC tip projects over the lower SVC at or near the cavoatrial junction. 2. Stable cardiomegaly. Pulmonary venous hypertension without overt edema. No acute cardiopulmonary disease.      Scheduled Meds: . acetaminophen  1,000 mg Oral TID  . amLODipine  10 mg Oral Daily  .  antiseptic oral rinse  15 mL Mouth Rinse q12n4p  . apixaban  5 mg Oral BID  . atorvastatin  40 mg Oral QODAY  . carvedilol  3.125 mg Oral BID WC  . clotrimazole   Topical BID  . diphenoxylate-atropine  1 tablet Oral QID  . ferrous sulfate  325 mg Oral TID  . furosemide  40 mg Oral Daily  . hydrALAZINE  50 mg Oral Q8H  . insulin aspart  0-15 Units Subcutaneous Q4H  . insulin glargine  30 Units Subcutaneous QHS  . isosorbide mononitrate  30 mg Oral Daily  . lip balm  1 application Topical BID  . neomycin-bacitracin-polymyxin   Topical BID  . OxyCODONE  15 mg Oral Q12H  . potassium chloride  40 mEq Oral Daily  . sodium chloride  3 mL Intravenous Q12H  . zolpidem  10 mg Oral QHS   Continuous Infusions: . sodium chloride 20 mL/hr (10/08/13 1353)  . [name] KitchenTPN (CLINIMIX-E) Adult 125 mL/hr at 10/08/13 1732   And  . fat emulsion 250 mL (10/08/13 1732)  . [name] KitchenTPN (CLINIMIX-E)  Adult     And  . fat emulsion      Time spent: 25 minutes.   LOS: 46 days   [facility] Pager [phone].   *Please note that the hospitalists switch teams on Wednesdays. Please call the flow manager at [phone] if you are having difficulty reaching the hospitalist taking care of this patient as she can update you and provide the most up-to-date pager number of provider caring for the patient. If 8PM-8AM, please contact night-coverage at [email or web address], password [facility]  10/09/2013, 8:55 AM

## 2013-10-09 NOTE — Progress Notes (Signed)
PARENTERAL NUTRITION CONSULT NOTE - FOLLOW UP  Pharmacy Consult for TNA Indication: Enterocutaneous fistula  No Known Allergies  Patient Measurements: Height: 6' (182.9 cm) Weight: 393 lb 1.3 oz (178.3 kg) IBW/kg (Calculated) : 77.6 Adjusted Body Weight: 102.6kg  Vital Signs: Temp: 97.6 F (36.4 C) (01/03 0615) Temp src: Oral (01/03 0615) BP: 142/87 mmHg (01/03 0615) Pulse Rate: 62 (01/03 0615) Intake/Output from previous day: 01/02 0701 - 01/03 0700 In: 0  Out: 2050 [Urine:2050] Above numbers appear incomplete   Labs:  Recent Labs  10/07/13 1140  WBC 12.5*  HGB 11.0*  HCT 33.3*  PLT 376    Recent Labs  10/07/13 0330  NA 135*  K 3.6*  CL 98  CO2 28  GLUCOSE 133*  BUN 18  CREATININE 0.85  CALCIUM 9.0  MG 1.5  PHOS 4.9*  PROT 6.9  ALBUMIN 2.2*  AST 37  ALT 51  ALKPHOS 69  BILITOT 0.9  Calcium 10.4  Recent Labs  10/08/13 2004 10/09/13 0004 10/09/13 0413  GLUCAP 166* 130* 134*   Insulin Requirements in the past 24 hours:   CBGs < 150; required 14 units of moderate-scale SSI. One value < 100 noted   TNA contains regular insulin 50 units per 3L bag (16.6 units/L, increased on 12/24)  Lantus 30 units QHS  Nutritional Goals:   Goals per RD 12/30:  2370-2650 kcal/day, 160-180 grams/day protein, 3.3-3.5 L/day fluid.  Clinimix E 5/15 at goal rate 125 ml/hr (3L/day) with 20% lipids at 10 ml/hr to provide 150g protein and 2675 kcal daily.  Due to TNA bag capacity, maximum volume is 3L per 24 hrs.  This rate will meet >/= 90% of estimated needs.  Current Nutrition:   NPO except for ice chips, sips with meds  Clinimix E 5/15 at 165ml/hr and 20% lipids at 10 ml/hr  IVF: NS at [place]: 63 yo male with large abdominal abscess with infected old Gore Tex mesh s/p I&D abscess, removal of mesh, debridement of abdominal wall and peritoneum. 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 TNA started 11/12.  Patient currently unable to be  discharged to a facility nearby that can accommodate both TNA and wound drainage system.   On 12/21, [facility] requested re-trying cyclic TNA, but because there is no advantage for discharge planning and pt has increased risks of fluid overload and hypoglycemia, we will continue with 24 hr continuous TPN.  1/3:  Day #52 TNA. Pt remains NPO on 24 hour continuous TPN.  NO changes   Possibility of re-trying cyclic administration was mentioned 1/1 - because patient had a CBG < 100 last night on continuousTNA, am concerned that hypoglycemia would recur if cycling is re-attempted. (If cycling the formula would facilitate discharge we could reconsider but on last report that was not the case).  Labs:   Glucose: Hx DM, on insulin PTA. CBGs < 150 after increasing Novolog to moderate-scale SSI q4h 12/30. Continues Lantus qhs and insulin in TNA as outlined above.   One CBG value < 100 last night - will watch.  Electrolytes: WNL on 1/1 except Phos = 4.9 (slightly elevated), Corr Ca x Phos= 51.    Renal:  SCr WNL on 1/1.LFTs: WNL 1/1  TGs: WNL on 12/29   Prealbumin: 10.3 (11/13), 11.1 (11/17), 11.2(11/24), 13.6 (12/1), 10.3 (12/8), 13.4 (12/15), 15.4 (12/22), 14.4 (12/29) - slightly low, but improved from baseline  Weight change of + 26kg recorded 12/30 to current weight, but does not correlate with I/O  and pt comfortable lying flat - suspect measurement artifact of some type.  Plan:   Continue Clinimix E 5/15 at 125 ml/hr with lipids 20% at 10 ml/hr over 24 hours.     Continue insulin 50 units per 3L bag (16.6 units/L)  TNA to contain standard multivitamins and trace elements.  Continue CBG checks q4h, moderate SSI coverage .   Continue Lantus 30 units QHS.  KCl 61mEq PO daily per MD  Continue NS at [facility]  TNA lab panels on Mondays & Thursdays   Monitor Phos  F/U discharge plans and TNA arrangement.  Doreene Eland, PharmD, BCPS.   Pager: [phone] 10/09/2013  8:04  AM

## 2013-10-10 LAB — GLUCOSE, CAPILLARY
GLUCOSE-CAPILLARY: 121 mg/dL — AB (ref 70–99)
GLUCOSE-CAPILLARY: 148 mg/dL — AB (ref 70–99)
Glucose-Capillary: 130 mg/dL — ABNORMAL HIGH (ref 70–99)
Glucose-Capillary: 132 mg/dL — ABNORMAL HIGH (ref 70–99)
Glucose-Capillary: 141 mg/dL — ABNORMAL HIGH (ref 70–99)
Glucose-Capillary: 158 mg/dL — ABNORMAL HIGH (ref 70–99)

## 2013-10-10 MED ORDER — MORPHINE SULFATE 4 MG/ML IJ SOLN
4.0000 mg | Freq: Once | INTRAMUSCULAR | Status: AC
  Administered 2013-10-10: 4 mg via INTRAVENOUS
  Filled 2013-10-10: qty 1

## 2013-10-10 MED ORDER — TRACE MINERALS CR-CU-F-FE-I-MN-MO-SE-ZN IV SOLN
INTRAVENOUS | Status: AC
  Administered 2013-10-10: 18:00:00 via INTRAVENOUS
  Filled 2013-10-10: qty 3000

## 2013-10-10 MED ORDER — FAT EMULSION 20 % IV EMUL
250.0000 mL | INTRAVENOUS | Status: AC
  Administered 2013-10-10: 250 mL via INTRAVENOUS
  Filled 2013-10-10: qty 250

## 2013-10-10 NOTE — Progress Notes (Signed)
PARENTERAL NUTRITION CONSULT NOTE - FOLLOW UP  Pharmacy Consult for TNA Indication: Enterocutaneous fistula  No Known Allergies  Patient Measurements: Height: 6' (182.9 cm) Weight: 336 lb 3.2 oz (152.499 kg) (standing scale) IBW/kg (Calculated) : 77.6 Adjusted Body Weight: 102.6kg  Vital Signs: Temp: 98.3 F (36.8 C) (01/04 0600) Temp src: Oral (01/04 0600) BP: 167/84 mmHg (01/04 0600) Pulse Rate: 65 (01/04 0600) Intake/Output from previous day: 01/03 0701 - 01/04 0700 In: 9300 [I.V.:1200; TPN:8100] Out: 5300 [Urine:4600; Drains:700] Above numbers appear incomplete   Labs:  Recent Labs  10/07/13 1140  WBC 12.5*  HGB 11.0*  HCT 33.3*  PLT 376   No results found for this basename: NA, K, CL, CO2, GLUCOSE, BUN, CREATININE, LABCREA, CREAT24HRUR, CALCIUM, MG, PHOS, PROT, ALBUMIN, AST, ALT, ALKPHOS, BILITOT, BILIDIR, IBILI, PREALBUMIN, TRIG, CHOLHDL, CHOL,  in the last 72 hoursCalcium 10.4  Recent Labs  10/09/13 2010 10/09/13 2351 10/10/13 0406  GLUCAP 140* 131* 130*   Insulin Requirements in the past 24 hours:   CBGs < 150; required 12 units of moderate-scale SSI.  TNA contains regular insulin 50 units per 3L bag (16.6 units/L, increased on 12/24)  Lantus 30 units QHS  Nutritional Goals:   Goals per RD 12/30:  2370-2650 kcal/day, 160-180 grams/day protein, 3.3-3.5 L/day fluid.  Clinimix E 5/15 at goal rate 125 ml/hr (3L/day) with 20% lipids at 10 ml/hr to provide 150g protein and 2675 kcal daily.  Due to TNA bag capacity, maximum volume is 3L per 24 hrs.  This rate will meet >/= 90% of estimated needs.  Current Nutrition:   NPO except for ice chips, sips with meds  Clinimix E 5/15 at 149ml/hr and 20% lipids at 10 ml/hr  IVF: NS at [place]: 63 yo male with large abdominal abscess with infected old Gore Tex mesh s/p I&D abscess, removal of mesh, debridement of abdominal wall and peritoneum. 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 TNA  started 11/12.  Patient currently unable to be discharged to a facility nearby that can accommodate both TNA and wound drainage system.   1/4:  Day #53 TNA. Pt remains NPO on 24 hour continuous TPN.  NO changes  Possibility of re-trying cyclic administration was mentioned 1/1 - because patient had issues with low CBGs previously on cyclic TNA, am concerned that hypoglycemia would recur if cycling is re-attempted. (If cycling the formula would facilitate discharge we could reconsider but on last report that was not the case).  Labs:   Glucose: Hx DM, on insulin PTA. CBGs < 150 after increasing Novolog to moderate-scale SSI q4h 12/30. Continues Lantus qhs and insulin in TNA as outlined above.    Electrolytes: WNL on 1/1 except Phos = 4.9 (slightly elevated), Corr Ca x Phos= 51.    Renal:  SCr WNL on 1/1.LFTs: WNL 1/1  TGs: WNL on 12/29   Prealbumin: 10.3 (11/13), 11.1 (11/17), 11.2(11/24), 13.6 (12/1), 10.3 (12/8), 13.4 (12/15), 15.4 (12/22), 14.4 (12/29) - slightly low, but improved from baseline  Weight change of + 26kg recorded 12/30 to current weight, but does not correlate with I/O and pt comfortable lying flat - suspect measurement artifact of some type.  Plan:   Continue Clinimix E 5/15 at 125 ml/hr with lipids 20% at 10 ml/hr over 24 hours.     Continue insulin 50 units per 3L bag (16.6 units/L)  TNA to contain standard multivitamins and trace elements.  Continue CBG checks q4h, moderate SSI coverage .   Continue Lantus 30  units QHS.  KCl 7mEq PO daily per MD  Continue [facility] at [facility]  TNA lab panels on Mondays & Thursdays   Monitor Phos  F/U discharge plans and TNA arrangement.  Doreene Eland, PharmD, BCPS.   Pager: [phone] 10/10/2013  7:55 AM

## 2013-10-10 NOTE — Progress Notes (Signed)
57 Days Post-Op  Subjective: No new complaints Stable overnight  Objective: Vital signs in last 24 hours: Temp:  [97.9 F (36.6 C)-98.3 F (36.8 C)] 98.3 F (36.8 C) (01/04 0600) Pulse Rate:  [62-67] 65 (01/04 0600) Resp:  [16-18] 18 (01/04 0600) BP: (122-167)/(70-84) 167/84 mmHg (01/04 0600) SpO2:  [96 %-97 %] 96 % (01/04 0600) Weight:  [336 lb 3.2 oz (152.499 kg)] 336 lb 3.2 oz (152.499 kg) (01/04 0153) Last BM Date: 10/08/13  Intake/Output from previous day: 01/03 0701 - 01/04 0700 In: 9300 [I.V.:1200; TPN:8100] Out: 5300 [Urine:4600; Drains:700] Intake/Output this shift:    Abdomen soft, obese, pouch in place with small bowel contents, minimally tender  Lab Results:   Recent Labs  10/07/13 1140  WBC 12.5*  HGB 11.0*  HCT 33.3*  PLT 376   BMET No results found for this basename: NA, K, CL, CO2, GLUCOSE, BUN, CREATININE, CALCIUM,  in the last 72 hours PT/INR No results found for this basename: LABPROT, INR,  in the last 72 hours ABG No results found for this basename: PHART, PCO2, PO2, HCO3,  in the last 72 hours  Studies/Results: No results found.  Anti-infectives: Anti-infectives   Start     Dose/Rate Route Frequency Ordered Stop   10/02/13 1600  ciprofloxacin (CIPRO) tablet 500 mg     500 mg Oral 2 times daily 10/02/13 1340 10/08/13 2014   09/29/13 0000  vancomycin (VANCOCIN) 1,500 mg in sodium chloride 0.9 % 500 mL IVPB  Status:  Discontinued     1,500 mg 250 mL/hr over 120 Minutes Intravenous Every 12 hours 09/28/13 1115 10/02/13 1339   09/28/13 1200  piperacillin-tazobactam (ZOSYN) IVPB 3.375 g  Status:  Discontinued     3.375 g 12.5 mL/hr over 240 Minutes Intravenous 3 times per day 09/28/13 1113 10/02/13 1339   09/28/13 1200  vancomycin (VANCOCIN) 2,500 mg in sodium chloride 0.9 % 500 mL IVPB     2,500 mg 250 mL/hr over 120 Minutes Intravenous  Once 09/28/13 1115 09/28/13 1412   08/31/13 0800  vancomycin (VANCOCIN) 1,750 mg in sodium chloride 0.9  % 500 mL IVPB  Status:  Discontinued     1,750 mg 250 mL/hr over 120 Minutes Intravenous Every 24 hours 08/31/13 0753 08/31/13 1417   08/16/13 0800  vancomycin (VANCOCIN) 2,000 mg in sodium chloride 0.9 % 500 mL IVPB  Status:  Discontinued     2,000 mg 250 mL/hr over 120 Minutes Intravenous Every 24 hours 08/15/13 1411 08/31/13 0753   08/15/13 1500  vancomycin (VANCOCIN) 2,000 mg in sodium chloride 0.9 % 500 mL IVPB     2,000 mg 250 mL/hr over 120 Minutes Intravenous  Once 08/15/13 1411 08/15/13 1713   08/14/13 1600  piperacillin-tazobactam (ZOSYN) IVPB 3.375 g  Status:  Discontinued     3.375 g 12.5 mL/hr over 240 Minutes Intravenous Every 8 hours 08/14/13 1543 08/31/13 1417      Assessment/Plan: s/p Procedure(s): INCISION AND DRAINAGE ABSCESS, mesh  (N/A)  Continuing NPO, NG, bowel rest, TNA for nutritiion  LOS: 60 days    Javarious Elsayed A 10/10/2013

## 2013-10-10 NOTE — Progress Notes (Addendum)
[facility] PROGRESS NOTE   Kurt Cross [id number] DOB: 03-24-1951 DOA: 08/11/2013 PCP: [name], MD  Brief narrative: 63 year old gentleman with a PMH of chronic systolic CHF, status post AICD, obesity, OSA, type 2 DM, recovering from a complicated abdominal wall abscess/infected Gore Tex mesh status post I&D/removal of mesh on 08/11/13 with hospital course complicated by the development of an entero-atmospheric fistula, status post Eakin's pouch, ongoing need for bowel rest to assist with healing, on TNA for nutrition.  Too medically complex for tx to SNF, and not approved for local LTAC.  Assessment/Plan: Principal Problem:   Entero-atmospheric fistula Continue bowel rest and Eakin's pouch to low suction. Surgery following. Active Problems:   Atrial fibrillation/flutter Likely triggered by infection. Back in normal sinus rhythm. Cardiac markers negative x3. EF 55% on most recent echocardiogram. Continue Coreg and Eliquis.  CHADSVASc score is 4.   Febrile illness Off antibiotics now. Recently treated for sepsis secondary to coagulase-negative Staphylococcus status post 2 weeks of vancomycin and Cipro x 7 days. Blood cultures done 09/29/2013 are negative.   Diabetes mellitus, insulin dependent (IDDM), uncontrolled Currently being managed with 30 units of Lantus daily as well as SSI, moderate scale every 4 hours. CBGs 121-140.   HYPERLIPIDEMIA  Continue Lipitor.   Obesity, Class III, BMI 40-49.9 (morbid obesity) Currently on TNA.   OBSTRUCTIVE SLEEP APNEA Continue CPAP each bedtime.   Hypertension Continue Norvasc, Imdur, Lasix, hydralazine and Coreg. Systolic blood pressure improved with increase of Norvasc to 10 mg daily.   Acute-on-chronic respiratory failure secondary to probable community-acquired pneumonia Resolved. Chest x-rays done 09/28/2013 and 10/02/2013 negative for pneumonia.   Constipation, chronic Continue laxatives as needed.   Chronic combined systolic  and diastolic CHF (congestive heart failure) Last 2-D echocardiogram done 08/12/2013. EF 50-55%. Currently compensated.   Right leg pain Treating symptomatically with oxycodone as needed.   AAA (abdominal aortic aneurysm)/ 4.5 cm ascending per CT angio 08/01/13 Recommend close outpatient followup.   Infected prosthetic mesh of abdominal wall with GIANT abscess s/p removal 08/14/2013 Per surgery.   Recurrent ventral incisional hernia Per surgery.   Acute renal failure (ARF) Resolved.  Code Status: Full. Family Communication: No family at bedside. Disposition Plan: Unclear.  ? LTAC, ? SNF   IV access:  Triple lumen PICC placed 08/18/13   Anti-infectives:  Vancomycin 08/15/13>>> 08/31/13   Zosyn 08/14/13>>>08/31/13   Cipro 10/02/2013 >>> 10/08/2013   HPI/Subjective: Kurt Cross is complaining of abdominal pain today. No nausea or vomiting. No dyspnea. No chest pains.    Objective: Filed Vitals:   10/10/13 0153 10/10/13 0600 10/10/13 1050 10/10/13 1400  BP:  167/84 175/81 160/78  Pulse:  65 81 68  Temp:  98.3 F (36.8 C)  98.2 F (36.8 C)  TempSrc:  Oral  Oral  Resp:  18  18  Height:      Weight: 152.499 kg (336 lb 3.2 oz)     SpO2:  96%  95%    Intake/Output Summary (Last 24 hours) at 10/10/13 1532 Last data filed at 10/10/13 1400  Gross per 24 hour  Intake   9920 ml  Output   4550 ml  Net   5370 ml    Exam: Gen:  NAD Cardiovascular:  RRR, No M/R/G Respiratory:  Lungs diminished in the bases Gastrointestinal:  Abdomen obese, distended, Eakin's pouch with feculent drainage Extremities:  No C/E/C  Data Reviewed: Basic Metabolic Panel:  Recent Labs Lab 10/04/13 0430 10/07/13 0330  NA 136 135*  K 3.7 3.6*  CL 100 98  CO2 27 28  GLUCOSE 136* 133*  BUN 19 18  CREATININE 0.80 0.85  CALCIUM 9.0 9.0  MG 1.6 1.5  PHOS 4.1 4.9*   GFR Estimated Creatinine Clearance: 140.7 ml/min (by C-G formula based on Cr of 0.85). Liver Function Tests:  Recent  Labs Lab 10/04/13 0430 10/07/13 0330  AST 38* 37  ALT 56* 51  ALKPHOS 81 69  BILITOT 0.9 0.9  PROT 7.1 6.9  ALBUMIN 2.2* 2.2*    CBC:  Recent Labs Lab 10/04/13 0430 10/07/13 1140  WBC 8.0 12.5*  NEUTROABS 4.4  --   HGB 9.5* 11.0*  HCT 28.9* 33.3*  MCV 92.3 93.0  PLT 265 376   BNP (last 3 results)  Recent Labs  08/01/13 1649  PROBNP 481.4*   CBG:  Recent Labs Lab 10/09/13 2010 10/09/13 2351 10/10/13 0406 10/10/13 0801 10/10/13 1204  GLUCAP 140* 131* 130* 121* 132*   Lipid Profile No results found for this basename: CHOL, HDL, LDLCALC, TRIG, CHOLHDL, LDLDIRECT,  in the last 72 hours Microbiology No results found for this or any previous visit (from the past 240 hour(s)).   Procedures and Diagnostic Studies:  08/11/13: Dg hip complete right: IMPRESSION: No acute osseous injury of the right hip.  08/11/13: I&D of abscess, GoreTex mesh removal, debridement of abdominal wall and peritoneum done by Dr. Johney Maine.  08/12/13: 2 D Echo: EF 50-55%, regional wall motion abnormalities cannot be excluded.  08/13/13: Dg Chest Port 1 View: IMPRESSION: No acute findings. Stable cardiomegaly and pulmonary venous prominence without overt edema.  08/14/13: Lower Extremity Venous Duplex Bilateral: No obvious DVT bilaterally.  [name]'s cyst noted in right popliteal fossa.  08/14/13: CT Abdomen Pelvis Wo Contrast: IMPRESSION: 14.5 x 18.1 x 26.1 cm fluid collection/abscess within the anterior abdominal wall, as described above, incompletely visualized. The collection appears just anterior to prior ventral hernia mesh. Given the size and proximity to the skin surface, incision and drainage is suggested.  08/15/13: Dg Chest Port 1 View: IMPRESSION: Stable cardiomegaly with interval worsening of pulmonary vascular congestion/ edema relative to 08/13/2013.  08/17/13: Dg Chest Port 1 View: IMPRESSION: Stable chest with changes of congestive heart failure with pulmonary interstitial edema.  Cardiac pacer in stable position.  08/18/13: Dg Chest Port 1 View: IMPRESSION: 1. Right upper extremity PICC terminates in the mid superior vena cava. 2. Stable congestive heart failure pattern.  09/28/13: Dg Chest 2 View:  IMPRESSION: No active cardiopulmonary disease.    10/02/13: Dg Chest 1 View: IMPRESSION: 1. Right arm PICC tip projects over the lower SVC at or near the cavoatrial junction. 2. Stable cardiomegaly. Pulmonary venous hypertension without overt edema. No acute cardiopulmonary disease.      Scheduled Meds: . acetaminophen  1,000 mg Oral TID  . amLODipine  10 mg Oral Daily  . antiseptic oral rinse  15 mL Mouth Rinse q12n4p  . apixaban  5 mg Oral BID  . atorvastatin  40 mg Oral QODAY  . carvedilol  3.125 mg Oral BID WC  . clotrimazole   Topical BID  . diphenoxylate-atropine  1 tablet Oral QID  . ferrous sulfate  325 mg Oral TID  . furosemide  40 mg Oral Daily  . hydrALAZINE  50 mg Oral Q8H  . insulin aspart  0-15 Units Subcutaneous Q4H  . insulin glargine  30 Units Subcutaneous QHS  . isosorbide mononitrate  30 mg Oral Daily  . lip balm  1 application Topical BID  .  neomycin-bacitracin-polymyxin   Topical BID  . OxyCODONE  15 mg Oral Q12H  . potassium chloride  40 mEq Oral Daily  . sodium chloride  3 mL Intravenous Q12H  . zolpidem  10 mg Oral QHS   Continuous Infusions: . sodium chloride 20 mL/hr (10/10/13 1430)  . [name] KitchenTPN (CLINIMIX-E) Adult 125 mL/hr at 10/09/13 1759   And  . fat emulsion 250 mL (10/09/13 1800)  . [name] KitchenTPN (CLINIMIX-E) Adult     And  . fat emulsion      Time spent: 15 minutes.   LOS: 60 days   [facility] Pager [phone].   *Please note that the hospitalists switch teams on Wednesdays. Please call the flow manager at [phone] if you are having difficulty reaching the hospitalist taking care of this patient as she can update you and provide the most up-to-date pager number of provider caring for the patient. If  8PM-8AM, please contact night-coverage at [email or web address], password [facility]  10/10/2013, 3:32 PM

## 2013-10-10 NOTE — Progress Notes (Signed)
Pt has refused CPAP for the night, RT to monitor and assess as needed.  

## 2013-10-11 LAB — DIFFERENTIAL
BASOS PCT: 0 % (ref 0–1)
Basophils Absolute: 0 10*3/uL (ref 0.0–0.1)
Eosinophils Absolute: 0.3 10*3/uL (ref 0.0–0.7)
Eosinophils Relative: 4 % (ref 0–5)
LYMPHS ABS: 2.2 10*3/uL (ref 0.7–4.0)
LYMPHS PCT: 25 % (ref 12–46)
MONO ABS: 0.8 10*3/uL (ref 0.1–1.0)
Monocytes Relative: 9 % (ref 3–12)
NEUTROS ABS: 5.5 10*3/uL (ref 1.7–7.7)
Neutrophils Relative %: 63 % (ref 43–77)

## 2013-10-11 LAB — COMPREHENSIVE METABOLIC PANEL
ALT: 46 U/L (ref 0–53)
AST: 36 U/L (ref 0–37)
Albumin: 2.3 g/dL — ABNORMAL LOW (ref 3.5–5.2)
Alkaline Phosphatase: 62 U/L (ref 39–117)
BILIRUBIN TOTAL: 1.1 mg/dL (ref 0.3–1.2)
BUN: 21 mg/dL (ref 6–23)
CO2: 30 mEq/L (ref 19–32)
CREATININE: 0.89 mg/dL (ref 0.50–1.35)
Calcium: 8.9 mg/dL (ref 8.4–10.5)
Chloride: 96 mEq/L (ref 96–112)
GFR calc Af Amer: >90 mL/min (ref >90)
GFR calc non Af Amer: >90 mL/min (ref >90)
Glucose, Bld: 151 mg/dL — ABNORMAL HIGH (ref 70–99)
POTASSIUM: 3.9 meq/L (ref 3.7–5.3)
Sodium: 135 mEq/L — ABNORMAL LOW (ref 137–147)
Total Protein: 7 g/dL (ref 6.0–8.3)

## 2013-10-11 LAB — GLUCOSE, CAPILLARY
GLUCOSE-CAPILLARY: 144 mg/dL — AB (ref 70–99)
Glucose-Capillary: 148 mg/dL — ABNORMAL HIGH (ref 70–99)
Glucose-Capillary: 163 mg/dL — ABNORMAL HIGH (ref 70–99)
Glucose-Capillary: 172 mg/dL — ABNORMAL HIGH (ref 70–99)
Glucose-Capillary: 174 mg/dL — ABNORMAL HIGH (ref 70–99)

## 2013-10-11 LAB — CBC
HCT: 28.7 % — ABNORMAL LOW (ref 39.0–52.0)
Hemoglobin: 9.3 g/dL — ABNORMAL LOW (ref 13.0–17.0)
MCH: 30.2 pg (ref 26.0–34.0)
MCHC: 32.4 g/dL (ref 30.0–36.0)
MCV: 93.2 fL (ref 78.0–100.0)
Platelets: 271 10*3/uL (ref 150–400)
RBC: 3.08 MIL/uL — ABNORMAL LOW (ref 4.22–5.81)
RDW: 14.9 % (ref 11.5–15.5)
WBC: 8.8 10*3/uL (ref 4.0–10.5)

## 2013-10-11 LAB — MAGNESIUM: Magnesium: 1.6 mg/dL (ref 1.5–2.5)

## 2013-10-11 LAB — PREALBUMIN: Prealbumin: 18.1 mg/dL (ref 17.0–34.0)

## 2013-10-11 LAB — PHOSPHORUS: PHOSPHORUS: 4.9 mg/dL — AB (ref 2.3–4.6)

## 2013-10-11 LAB — TRIGLYCERIDES: Triglycerides: 67 mg/dL (ref <150)

## 2013-10-11 MED ORDER — OXYCODONE HCL ER 20 MG PO T12A
20.0000 mg | EXTENDED_RELEASE_TABLET | Freq: Two times a day (BID) | ORAL | Status: DC
  Administered 2013-10-11 – 2013-10-15 (×8): 20 mg via ORAL
  Filled 2013-10-11 (×8): qty 1

## 2013-10-11 MED ORDER — FAT EMULSION 20 % IV EMUL
250.0000 mL | INTRAVENOUS | Status: AC
  Administered 2013-10-11: 250 mL via INTRAVENOUS
  Filled 2013-10-11: qty 250

## 2013-10-11 MED ORDER — OXYCODONE HCL 5 MG PO TABS
15.0000 mg | ORAL_TABLET | ORAL | Status: DC | PRN
  Administered 2013-10-11 – 2013-10-15 (×16): 15 mg via ORAL
  Filled 2013-10-11 (×15): qty 3

## 2013-10-11 MED ORDER — TRACE MINERALS CR-CU-F-FE-I-MN-MO-SE-ZN IV SOLN
INTRAVENOUS | Status: AC
  Administered 2013-10-11: 18:00:00 via INTRAVENOUS
  Filled 2013-10-11: qty 3000

## 2013-10-11 NOTE — Progress Notes (Signed)
58 Days Post-Op  Subjective: No real change, he says Dr. Marcello Moores discussed switching to a colostomy bag and see how he does. Objective: Vital signs in last 24 hours: Temp:  [97.4 F (36.3 C)-98.7 F (37.1 C)] 97.4 F (36.3 C) (01/05 0700) Pulse Rate:  [60-81] 74 (01/05 0700) Resp:  [18] 18 (01/05 0700) BP: (148-176)/(68-81) 176/80 mmHg (01/05 0700) SpO2:  [95 %-97 %] 96 % (01/05 0700) Weight:  [164.293 kg (362 lb 3.2 oz)] 164.293 kg (362 lb 3.2 oz) (01/05 0700) Last BM Date: 10/11/13 Eakin's drainage [phone] ml daily over the last week. Diet:  TNA Afebrile, VSS La bs OK this AM He has completed a 7 day course of Cipro on 10/08/13. Intake/Output from previous day: 01/04 0701 - 01/05 0700 In: 3720 [I.V.:480; TPN:3240] Out: 3225 [Urine:2525; Drains:700] Intake/Output this shift:    General appearance: alert, cooperative and no distress Resp: clear to auscultation bilaterally GI: drainage is dark, eakins in place, no distension.  No new change noted.  Lab Results:   Recent Labs  10/11/13 0357  WBC 8.8  HGB 9.3*  HCT 28.7*  PLT 271    BMET  Recent Labs  10/11/13 0357  NA 135*  K 3.9  CL 96  CO2 30  GLUCOSE 151*  BUN 21  CREATININE 0.89  CALCIUM 8.9   PT/INR No results found for this basename: LABPROT, INR,  in the last 72 hours   Recent Labs Lab 10/07/13 0330 10/11/13 0357  AST 37 36  ALT 51 46  ALKPHOS 69 62  BILITOT 0.9 1.1  PROT 6.9 7.0  ALBUMIN 2.2* 2.3*     Lipase  No results found for this basename: lipase     Studies/Results: No results found.  Medications: . acetaminophen  1,000 mg Oral TID  . amLODipine  10 mg Oral Daily  . antiseptic oral rinse  15 mL Mouth Rinse q12n4p  . apixaban  5 mg Oral BID  . atorvastatin  40 mg Oral QODAY  . carvedilol  3.125 mg Oral BID WC  . clotrimazole   Topical BID  . diphenoxylate-atropine  1 tablet Oral QID  . ferrous sulfate  325 mg Oral TID  . furosemide  40 mg Oral Daily  . hydrALAZINE  50  mg Oral Q8H  . insulin aspart  0-15 Units Subcutaneous Q4H  . insulin glargine  30 Units Subcutaneous QHS  . isosorbide mononitrate  30 mg Oral Daily  . lip balm  1 application Topical BID  . neomycin-bacitracin-polymyxin   Topical BID  . OxyCODONE  15 mg Oral Q12H  . potassium chloride  40 mEq Oral Daily  . sodium chloride  3 mL Intravenous Q12H  . zolpidem  10 mg Oral QHS    Assessment/Plan 1. Infected prosthetic mesh of abdominal wall with a large abscess s/p removal 08/14/2013 Dr. Michael Boston  2. 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 Imodium and lomotil maximized by Dr. Johney Maine. On TPN; Ongoing fistula output .  3. Sepsis-resolved  4. Afib on BB and chronic anticoagulation with Apixaban (Eliquis)  5. Hx of chronic combined diastolic and systolic CHF-stable  6. Diabetes mellitus-on SSI, lantus. CBGs are better. Appreciate IM assistance.  7. Stage III chronic renal disease-stable  8. Hypertension-improved, appreciate IM assistance.  9. Hyperlipidemia  10. Obesity, Class III, BMI 40-49.9 (morbid obesity)  11. 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 Deconditioning-pt walking in hallways now    PLan:  Continue current rx for  now.   They discussed a gravity system last week.  I will talk with Dr. Marlou Starks.   LOS: 61 days    Kurt Cross 10/11/2013

## 2013-10-11 NOTE — Progress Notes (Signed)
Physical Therapy Treatment Patient Details Name: MARQUICE CRABBE MRN: [id number] DOB: 24-Mar-1951 Today's Date: 10/11/2013 Time: [id number] PT Time Calculation (min): 50 min  PT Assessment / Plan / Recommendation  History of Present Illness MINORU QUAIN is a 63 y.o. male  with known history of chronic systolic heart failure status post defibrillator placement last EF measured was in 2013 was 35-40%, on home oxygen, OSA noncompliant with CPAP, diabetes mellitus, hypertension and hyperlipidemia presented to the ER because right leg pain after sustaining a fall the night prior to admission.  08/14/13: s/p abdominal abscess removal   PT Comments   Pt OOB in recliner.  Assisted with amb.  Pt requires + 2 assist for safety and chair closely following behind as either knee buckles with fatigue. Pt also demon limited activity tolerance and fatigues quickly requiring sitting rest break.   Follow Up Recommendations  SNF     Does the patient have the potential to tolerate intense rehabilitation     Barriers to Discharge        Equipment Recommendations       Recommendations for Other Services    Frequency     Progress towards PT Goals Progress towards PT goals: Progressing toward goals  Plan Current plan remains appropriate    Precautions / Restrictions Precautions Precautions: Fall Precaution Comments: ostomy bag on R side and wall suction to ABD Restrictions Weight Bearing Restrictions: No    Pertinent Vitals/Pain No c/o pain    Mobility  Bed Mobility Bed Mobility: Not assessed Details for Bed Mobility Assistance: Pt OOB in recliner Transfers Transfers: Sit to Stand;Stand to Sit Sit to Stand: 1: +2 Total assist (requires increased assist from lower surface recliner chair.) Sit to Stand: Patient Percentage: 70% Stand to Sit: 1: +2 Total assist Stand to Sit: Patient Percentage: 70% (pt tends to "plop" quickly due to fatigue) Details for Transfer Assistance: pt uses momentum forward  motion to stand and tends to "plop" uncontrolled with sitting.  Requires increased assist with standing from lower surface recliner chair.  Ambulation/Gait Ambulation Distance (Feet): 300 Feet (100 feet x 3 with sitting rest breaks.) Assistive device: Rolling walker (Beri walker) Ambulation/Gait Assistance Details: pt demon limited activity tolerance and requires sitting rest breaks.  B knees buckle with fatigue so chair closely following behind during amb.   Gait Pattern: Step-through pattern;Wide base of support;Decreased stride length;Ataxic Gait velocity: decreased    PT Goals (current goals can now be found in the care plan section)    Visit Information  Last PT Received On: 10/11/13 Assistance Needed: +2 History of Present Illness: JAZIR LICHTENWALNER is a 63 y.o. male  with known history of chronic systolic heart failure status post defibrillator placement last EF measured was in 2013 was 35-40%, on home oxygen, OSA noncompliant with CPAP, diabetes mellitus, hypertension and hyperlipidemia presented to the ER because right leg pain after sustaining a fall the night prior to admission.  08/14/13: s/p abdominal abscess removal    Subjective Data      Cognition       Balance     End of Session PT - End of Session Equipment Utilized During Treatment: Gait belt Activity Tolerance: Patient tolerated treatment well Patient left: in chair;with call bell/phone within reach   Rica Koyanagi  PTA [facility]  Acute  Rehab Pager      [phone]

## 2013-10-11 NOTE — Progress Notes (Signed)
Seen and agree  

## 2013-10-11 NOTE — Progress Notes (Signed)
PARENTERAL NUTRITION CONSULT NOTE - FOLLOW UP  Pharmacy Consult for TNA Indication: Enterocutaneous fistula  No Known Allergies  Patient Measurements: Height: 6' (182.9 cm) Weight: 362 lb 3.2 oz (164.293 kg) IBW/kg (Calculated) : 77.6 Adjusted Body Weight: 102.6kg  Vital Signs: Temp: 97.4 F (36.3 C) (01/05 0700) Temp src: Oral (01/05 0700) BP: 133/66 mmHg (01/05 0843) Pulse Rate: 73 (01/05 0843) Intake/Output from previous day: 01/04 0701 - 01/05 0700 In: 3720 [I.V.:480; TPN:3240] Out: 3225 [Urine:2525; Drains:700] Above numbers appear incomplete   Labs:  Recent Labs  10/11/13 0357  WBC 8.8  HGB 9.3*  HCT 28.7*  PLT 271    Recent Labs  10/11/13 0357  NA 135*  K 3.9  CL 96  CO2 30  GLUCOSE 151*  BUN 21  CREATININE 0.89  CALCIUM 8.9  MG 1.6  PHOS 4.9*  PROT 7.0  ALBUMIN 2.3*  AST 36  ALT 46  ALKPHOS 62  BILITOT 1.1  TRIG 67  Calcium 10.4  Recent Labs  10/10/13 2341 10/11/13 0426 10/11/13 0731  GLUCAP 141* 148* 144*   Insulin Requirements in the past 24 hours:   CBGs < 150; required 11 units of moderate-scale SSI.  TNA contains regular insulin 50 units per 3L bag (16.6 units/L, increased on 12/24)  Lantus 30 units QHS  Nutritional Goals:   Goals per RD 12/30:  2370-2650 kcal/day, 160-180 grams/day protein, 3.3-3.5 L/day fluid.  Clinimix E 5/15 at goal rate 125 ml/hr (3L/day) with 20% lipids at 10 ml/hr to provide 150g protein and 2675 kcal daily.  Due to TNA bag capacity, maximum volume is 3L per 24 hrs.  This rate will meet >/= 90% of estimated needs.  Current Nutrition:   NPO except for ice chips, sips with meds  Clinimix E 5/15 at 124ml/hr and 20% lipids at 10 ml/hr  IVF: NS at [place]: 63 yo male with large abdominal abscess with infected old Gore Tex mesh s/p I&D abscess, removal of mesh, debridement of abdominal wall and peritoneum. 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 TNA started 11/12.  Patient  currently unable to be discharged to a facility nearby that can accommodate both TNA and wound drainage system.   1/5:  Day #54 TNA. Pt remains NPO on 24 hour continuous TPN.  NO changes  Possibility of re-trying cyclic administration was mentioned 1/1 - because patient had issues with low CBGs previously on cyclic TNA, am concerned that hypoglycemia would recur if cycling is re-attempted. (If cycling the formula would facilitate discharge we could reconsider but on last report that was not the case).  Labs:   Glucose: Hx DM, on insulin PTA. CBGs < 150 after increasing Novolog to moderate-scale SSI q4h 12/30. Continues Lantus qhs and insulin in TNA as outlined above.    Electrolytes: WNL except Phos = 4.9 (slightly elevated), Corr Ca x Phos= 50.    Renal:  SCr WNL on 1/5.LFTs: WNL 1/5  TGs: WNL on 1/5  Prealbumin: 10.3 (11/13), 11.1 (11/17), 11.2(11/24), 13.6 (12/1), 10.3 (12/8), 13.4 (12/15), 15.4 (12/22), 14.4 (12/29) - slightly low, but improved from baseline  Weight change of + 26kg recorded 12/30 to current weight, but does not correlate with I/O and pt comfortable lying flat - suspect measurement artifact of some type.  Plan:   Continue Clinimix E 5/15 at 125 ml/hr with lipids 20% at 10 ml/hr over 24 hours.     Continue insulin 50 units per 3L bag (16.6 units/L)  TNA to contain standard  multivitamins and trace elements.  Continue CBG checks q4h, moderate SSI coverage .   Continue Lantus 30 units QHS.  KCl 26mEq PO daily per MD  Continue NS at [facility]  TNA lab panels on Mondays & Thursdays   Monitor Phos  F/U discharge plans and TNA arrangement.  Ralene Bathe, PharmD, BCPS 10/11/2013, 10:41 AM  Pager: [phone]

## 2013-10-11 NOTE — Progress Notes (Signed)
[facility] PROGRESS NOTE   Kurt Cross [id number] DOB: 12-16-1950 DOA: 08/11/2013 PCP: [name], MD  Brief narrative: 63 year old gentleman with a PMH of chronic systolic CHF, status post AICD, obesity, OSA, type 2 DM, recovering from a complicated abdominal wall abscess/infected Gore Tex mesh status post I&D/removal of mesh on 08/11/13 with hospital course complicated by the development of an entero-atmospheric fistula, status post Eakin's pouch, ongoing need for bowel rest to assist with healing, on TNA for nutrition.  Too medically complex for tx to SNF, and not approved for local LTAC.  The patient's medical problems appear to be stable now, so IM signing off.  Please re-consult if needed.  Assessment/Plan: Principal Problem:   Entero-atmospheric fistula Continue bowel rest and Eakin's pouch to low suction. Surgery following.  ? Gravity system. Active Problems:   Atrial fibrillation/flutter Likely triggered by infection. Back in normal sinus rhythm. Cardiac markers negative x3. EF 55% on most recent echocardiogram. Continue Coreg and Eliquis.  CHADSVASc score is 4.   Febrile illness Off antibiotics now. Recently treated for sepsis secondary to coagulase-negative Staphylococcus status post 2 weeks of vancomycin and Cipro x 7 days. Blood cultures done 09/29/2013 are negative.   Diabetes mellitus, insulin dependent (IDDM), uncontrolled Currently being managed with 30 units of Lantus daily as well as SSI, moderate scale every 4 hours. CBGs 141-163.   HYPERLIPIDEMIA  Continue Lipitor.   Obesity, Class III, BMI 40-49.9 (morbid obesity) Currently on TNA.   OBSTRUCTIVE SLEEP APNEA Continue CPAP each bedtime.   Hypertension Continue Norvasc, Imdur, Lasix, hydralazine and Coreg. Systolic blood pressure improved with increase of Norvasc to 10 mg daily.   Acute-on-chronic respiratory failure secondary to probable community-acquired pneumonia Resolved. Chest x-rays done 09/28/2013  and 10/02/2013 negative for pneumonia.   Constipation, chronic Continue laxatives as needed.   Chronic combined systolic and diastolic CHF (congestive heart failure) Last 2-D echocardiogram done 08/12/2013. EF 50-55%. Currently compensated.   Right leg pain Treating symptomatically with oxycodone as needed.   AAA (abdominal aortic aneurysm)/ 4.5 cm ascending per CT angio 08/01/13 Recommend close outpatient followup.   Infected prosthetic mesh of abdominal wall with GIANT abscess s/p removal 08/14/2013 Per surgery.   Recurrent ventral incisional hernia Per surgery.   Acute renal failure (ARF) Resolved.  Code Status: Full. Family Communication: No family at bedside. Disposition Plan: Unclear.  ? LTAC, ? SNF   IV access:  Triple lumen PICC placed 08/18/13   Anti-infectives:  Vancomycin 08/15/13>>> 08/31/13   Zosyn 08/14/13>>>08/31/13   Cipro 10/02/2013 >>> 10/08/2013   HPI/Subjective: Kurt Cross is having some abdominal pain intermittantly. No nausea or vomiting. No dyspnea. No chest pains.    Objective: Filed Vitals:   10/11/13 0624 10/11/13 0700 10/11/13 0843 10/11/13 1041  BP: 176/80 176/80 133/66 141/82  Pulse:  74 73 82  Temp:  97.4 F (36.3 C)    TempSrc:  Oral    Resp:  18    Height:      Weight:  164.293 kg (362 lb 3.2 oz)    SpO2:  96%      Intake/Output Summary (Last 24 hours) at 10/11/13 1235 Last data filed at 10/11/13 1000  Gross per 24 hour  Intake   3100 ml  Output   3175 ml  Net    -75 ml    Exam: Gen:  NAD Cardiovascular:  RRR, No M/R/G Respiratory:  Lungs diminished in the bases Gastrointestinal:  Abdomen obese, distended, Eakin's pouch with feculent drainage Extremities:  No C/E/C  Data Reviewed: Basic Metabolic Panel:  Recent Labs Lab 10/07/13 0330 10/11/13 0357  NA 135* 135*  K 3.6* 3.9  CL 98 96  CO2 28 30  GLUCOSE 133* 151*  BUN 18 21  CREATININE 0.85 0.89  CALCIUM 9.0 8.9  MG 1.5 1.6  PHOS 4.9* 4.9*    GFR Estimated Creatinine Clearance: 140.2 ml/min (by C-G formula based on Cr of 0.89). Liver Function Tests:  Recent Labs Lab 10/07/13 0330 10/11/13 0357  AST 37 36  ALT 51 46  ALKPHOS 69 62  BILITOT 0.9 1.1  PROT 6.9 7.0  ALBUMIN 2.2* 2.3*    CBC:  Recent Labs Lab 10/07/13 1140 10/11/13 0357  WBC 12.5* 8.8  NEUTROABS  --  5.5  HGB 11.0* 9.3*  HCT 33.3* 28.7*  MCV 93.0 93.2  PLT 376 271   BNP (last 3 results)  Recent Labs  08/01/13 1649  PROBNP 481.4*   CBG:  Recent Labs Lab 10/10/13 1924 10/10/13 2341 10/11/13 0426 10/11/13 0731 10/11/13 1152  GLUCAP 148* 141* 148* 144* 163*   Lipid Profile  Recent Labs  10/11/13 0357  TRIG 67   Microbiology No results found for this or any previous visit (from the past 240 hour(s)).   Procedures and Diagnostic Studies:  08/11/13: Dg hip complete right: IMPRESSION: No acute osseous injury of the right hip.  08/11/13: I&D of abscess, GoreTex mesh removal, debridement of abdominal wall and peritoneum done by Dr. Johney Maine.  08/12/13: 2 D Echo: EF 50-55%, regional wall motion abnormalities cannot be excluded.  08/13/13: Dg Chest Port 1 View: IMPRESSION: No acute findings. Stable cardiomegaly and pulmonary venous prominence without overt edema.  08/14/13: Lower Extremity Venous Duplex Bilateral: No obvious DVT bilaterally.  [name]'s cyst noted in right popliteal fossa.  08/14/13: CT Abdomen Pelvis Wo Contrast: IMPRESSION: 14.5 x 18.1 x 26.1 cm fluid collection/abscess within the anterior abdominal wall, as described above, incompletely visualized. The collection appears just anterior to prior ventral hernia mesh. Given the size and proximity to the skin surface, incision and drainage is suggested.  08/15/13: Dg Chest Port 1 View: IMPRESSION: Stable cardiomegaly with interval worsening of pulmonary vascular congestion/ edema relative to 08/13/2013.  08/17/13: Dg Chest Port 1 View: IMPRESSION: Stable chest with changes of  congestive heart failure with pulmonary interstitial edema. Cardiac pacer in stable position.  08/18/13: Dg Chest Port 1 View: IMPRESSION: 1. Right upper extremity PICC terminates in the mid superior vena cava. 2. Stable congestive heart failure pattern.  09/28/13: Dg Chest 2 View:  IMPRESSION: No active cardiopulmonary disease.    10/02/13: Dg Chest 1 View: IMPRESSION: 1. Right arm PICC tip projects over the lower SVC at or near the cavoatrial junction. 2. Stable cardiomegaly. Pulmonary venous hypertension without overt edema. No acute cardiopulmonary disease.      Scheduled Meds: . acetaminophen  1,000 mg Oral TID  . amLODipine  10 mg Oral Daily  . antiseptic oral rinse  15 mL Mouth Rinse q12n4p  . apixaban  5 mg Oral BID  . atorvastatin  40 mg Oral QODAY  . carvedilol  3.125 mg Oral BID WC  . clotrimazole   Topical BID  . diphenoxylate-atropine  1 tablet Oral QID  . ferrous sulfate  325 mg Oral TID  . furosemide  40 mg Oral Daily  . hydrALAZINE  50 mg Oral Q8H  . insulin aspart  0-15 Units Subcutaneous Q4H  . insulin glargine  30 Units Subcutaneous QHS  . isosorbide mononitrate  30 mg Oral Daily  . lip balm  1 application Topical BID  . neomycin-bacitracin-polymyxin   Topical BID  . OxyCODONE  15 mg Oral Q12H  . potassium chloride  40 mEq Oral Daily  . sodium chloride  3 mL Intravenous Q12H  . zolpidem  10 mg Oral QHS   Continuous Infusions: . sodium chloride 20 mL/hr (10/10/13 1430)  . [name] KitchenTPN (CLINIMIX-E) Adult 125 mL/hr at 10/10/13 1731   And  . fat emulsion 250 mL (10/10/13 1731)  . [name] KitchenTPN (CLINIMIX-E) Adult     And  . fat emulsion      Time spent: 15 minutes.   LOS: 61 days   [facility] Pager [phone].   *Please note that the hospitalists switch teams on Wednesdays. Please call the flow manager at [phone] if you are having difficulty reaching the hospitalist taking care of this patient as she can update you and provide the most up-to-date  pager number of provider caring for the patient. If 8PM-8AM, please contact night-coverage at [email or web address], password [facility]  10/11/2013, 12:35 PM

## 2013-10-12 LAB — GLUCOSE, CAPILLARY
GLUCOSE-CAPILLARY: 124 mg/dL — AB (ref 70–99)
GLUCOSE-CAPILLARY: 125 mg/dL — AB (ref 70–99)
GLUCOSE-CAPILLARY: 136 mg/dL — AB (ref 70–99)
Glucose-Capillary: 125 mg/dL — ABNORMAL HIGH (ref 70–99)
Glucose-Capillary: 134 mg/dL — ABNORMAL HIGH (ref 70–99)
Glucose-Capillary: 139 mg/dL — ABNORMAL HIGH (ref 70–99)
Glucose-Capillary: 163 mg/dL — ABNORMAL HIGH (ref 70–99)
Glucose-Capillary: 181 mg/dL — ABNORMAL HIGH (ref 70–99)

## 2013-10-12 MED ORDER — TRACE MINERALS CR-CU-F-FE-I-MN-MO-SE-ZN IV SOLN
INTRAVENOUS | Status: AC
  Administered 2013-10-12: 17:00:00 via INTRAVENOUS
  Filled 2013-10-12: qty 3000

## 2013-10-12 MED ORDER — FAT EMULSION 20 % IV EMUL
250.0000 mL | INTRAVENOUS | Status: AC
  Administered 2013-10-12: 250 mL via INTRAVENOUS
  Filled 2013-10-12: qty 250

## 2013-10-12 NOTE — Progress Notes (Signed)
PARENTERAL NUTRITION CONSULT NOTE - FOLLOW UP  Pharmacy Consult for TNA Indication: Enterocutaneous fistula  No Known Allergies  Patient Measurements: Height: 6' (182.9 cm) Weight: 362 lb 3.2 oz (164.293 kg) IBW/kg (Calculated) : 77.6 Adjusted Body Weight: 102.6kg  Vital Signs: Temp: 98.7 F (37.1 C) (01/06 0510) Temp src: Oral (01/06 0510) BP: 150/75 mmHg (01/06 0510) Pulse Rate: 79 (01/06 0510) Intake/Output from previous day: 01/05 0701 - 01/06 0700 In: 2020 [I.V.:400; TPN:1620] Out: 1100 [Urine:850; Drains:250] Above numbers appear incomplete   Labs:  Recent Labs  10/11/13 0357  WBC 8.8  HGB 9.3*  HCT 28.7*  PLT 271    Recent Labs  10/11/13 0357  NA 135*  K 3.9  CL 96  CO2 30  GLUCOSE 151*  BUN 21  CREATININE 0.89  CALCIUM 8.9  MG 1.6  PHOS 4.9*  PROT 7.0  ALBUMIN 2.3*  AST 36  ALT 46  ALKPHOS 62  BILITOT 1.1  PREALBUMIN 18.1  TRIG 67  Calcium 10.4  Recent Labs  10/12/13 0016 10/12/13 0409 10/12/13 0717  GLUCAP 134* 125* 124*   Insulin Requirements in the past 24 hours:   CBGs 124-174; required 12 units of moderate-scale SSI.  TNA contains regular insulin 50 units per 3L bag (16.6 units/L, increased on 12/24)  Lantus 30 units QHS  Nutritional Goals:   Goals per RD 12/30:  2370-2650 kcal/day, 160-180 grams/day protein, 3.3-3.5 L/day fluid.  Clinimix E 5/15 at goal rate 125 ml/hr (3L/day) with 20% lipids at 10 ml/hr to provide 150g protein and 2675 kcal daily.  Due to TNA bag capacity, maximum volume is 3L per 24 hrs.  This rate will meet >/= 90% of estimated needs.  Current Nutrition:   NPO except for ice chips, sips with meds  Clinimix E 5/15 at 146ml/hr and 20% lipids at 10 ml/hr  IVF: NS at [place]: 63 yo male with large abdominal abscess with infected old Gore Tex mesh s/p I&D abscess, removal of mesh, debridement of abdominal wall and peritoneum. 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 TNA started  11/12.  Patient currently unable to be discharged to a facility nearby that can accommodate both TNA and wound drainage system.   1/6:  Day #55 TNA. Pt remains NPO on 24 hour continuous TPN.  No issues per RN.  NO changes.   Labs:   Glucose: Hx DM, on insulin PTA. CBGs < 150 after increasing Novolog to moderate-scale SSI q4h 12/30. Continues Lantus qhs and insulin in TNA as outlined above.    Electrolytes: WNL except Phos = 4.9 (slightly elevated), Corr Ca x Phos= 50.    Renal:  SCr WNL on 1/5.LFTs: WNL 1/5  TGs: WNL on 1/5  Prealbumin: 10.3 (11/13), 11.1 (11/17), 11.2(11/24), 13.6 (12/1), 10.3 (12/8), 13.4 (12/15), 15.4 (12/22), 14.4 (12/29), 18.1 (1/5), improving slowly   Large fluctuations in weight daily - suspect measurement artifacts   Plan:   Continue Clinimix E 5/15 at 125 ml/hr with lipids 20% at 10 ml/hr over 24 hours.     Continue insulin 50 units per 3L bag (16.6 units/L)  TNA to contain standard multivitamins and trace elements.  Continue CBG checks q4h, moderate SSI coverage .   Continue Lantus 30 units QHS.  KCl 28mEq PO daily per MD  Continue [facility] at [facility]  TNA lab panels on Mondays & Thursdays   Monitor Phos  F/U discharge plans and TNA arrangement.  Ralene Bathe, PharmD, BCPS 10/12/2013, 11:17 AM  Pager: [phone]

## 2013-10-12 NOTE — Consult Note (Signed)
WOC fistula follow up Fistula located at 5 o'clock in open abdominal wound, stomatized 1 3/4" round, budded out of wound bed.   Wound bed is 95% granulation tissue with only some yellow slough aprox. 5% at the most proximal top edge.  Output liquid, green with some thickness from time to time but patient reports thin enough to empty through spout in Eakin pouch.  Dr. Milinda Cave at bedside to assess the wound bed Measurements: 15.5cm x 9.5cm x 1.5cm x 2.0cm undermining from 6-7 o'clock, no other areas undermined.  All in agreement at bedside to place Eakin with spigot in the most downward position we can based on the current size of the wound.  This will allow for gravity drainage and patient independence to learn to empty.   No Mepitel needed any longer in the wound bed due to the granulation over the wound bed.    Education provided: Demonstrated to patient how to use cut portion of the Eakin to window frame the wound prior to pouch placement.  Cut large Eakin with spigot at the most downward position I could based on the size of the wound and the size of the back of the [place].  Pt may fit best in a medium Eakin due to its shape should this patient dc to another facility or home that has access to that size pouch.   Orders updated to dc the Mepitel and suction at this time.  Pt and nursing to empty his Eakin pouch as needed to control output and keep seal of pouch.  Pattern for this pouch taped inside the cabinet door in the patients room.  Eakin pouches to be ordered per the nursing secretary for the patients room in case needed for change.  Other supplies in the room.  Pts Eakin to gravity drainage only now.    [place] team will follow along with you for care of fistula and wound assessments.  Please contact us for any other needs.  Armina Galloway Boswell RN,CWOCN [phone]

## 2013-10-12 NOTE — Progress Notes (Addendum)
CSW assisting with d/c planning. PN reviewed. Wall suctioning d/c ( trial basis ). CSW has initiated new SNF search requesting placement with TPN ( no suctioning ). Will continue to review PN to see how pt progresses without suctioning. Will provide bed offers, if appropriate.  Werner Lean LCSW [phone]

## 2013-10-12 NOTE — Progress Notes (Signed)
59 Days Post-Op  Subjective: Pt sleeping earlier.  Doing well, no real change.  Dressing change shows stomatized site now.  Objective: Vital signs in last 24 hours: Temp:  [98.6 F (37 C)-98.7 F (37.1 C)] 98.7 F (37.1 C) (01/06 0510) Pulse Rate:  [57-82] 79 (01/06 0510) Resp:  [20] 20 (01/06 0510) BP: (133-150)/(63-82) 150/75 mmHg (01/06 0510) SpO2:  [93 %-98 %] 95 % (01/06 0510) FiO2 (%):  [3 %] 3 % (01/05 1358) Last BM Date: 10/11/13 250 from [place] recorded yesterday Afebrile, VSS No labs Intake/Output from previous day: 01/05 0701 - 01/06 0700 In: 2020 [I.V.:400; TPN:1620] Out: 1100 [Urine:850; Drains:250] Intake/Output this shift:    General appearance: alert, cooperative and no distress GI: soft non tender see pictures below.     Measurements now:  15.5 x 9.5 x 1.5 cm  He still has some undermining at 6-7 o'clock and this is fairly tight. 2 cm deep. Lab Results:   Recent Labs  10/11/13 0357  WBC 8.8  HGB 9.3*  HCT 28.7*  PLT 271    BMET  Recent Labs  10/11/13 0357  NA 135*  K 3.9  CL 96  CO2 30  GLUCOSE 151*  BUN 21  CREATININE 0.89  CALCIUM 8.9   PT/INR No results found for this basename: LABPROT, INR,  in the last 72 hours   Recent Labs Lab 10/07/13 0330 10/11/13 0357  AST 37 36  ALT 51 46  ALKPHOS 69 62  BILITOT 0.9 1.1  PROT 6.9 7.0  ALBUMIN 2.2* 2.3*     Lipase  No results found for this basename: lipase     Studies/Results: No results found.  Medications: . acetaminophen  1,000 mg Oral TID  . amLODipine  10 mg Oral Daily  . antiseptic oral rinse  15 mL Mouth Rinse q12n4p  . apixaban  5 mg Oral BID  . atorvastatin  40 mg Oral QODAY  . carvedilol  3.125 mg Oral BID WC  . clotrimazole   Topical BID  . diphenoxylate-atropine  1 tablet Oral QID  . ferrous sulfate  325 mg Oral TID  . furosemide  40 mg Oral Daily  . hydrALAZINE  50 mg Oral Q8H  . insulin aspart  0-15 Units Subcutaneous Q4H  . insulin glargine  30  Units Subcutaneous QHS  . isosorbide mononitrate  30 mg Oral Daily  . lip balm  1 application Topical BID  . neomycin-bacitracin-polymyxin   Topical BID  . OxyCODONE  20 mg Oral Q12H  . potassium chloride  40 mEq Oral Daily  . sodium chloride  3 mL Intravenous Q12H  . zolpidem  10 mg Oral QHS    Assessment/Plan 1. Infected prosthetic mesh of abdominal wall with a large abscess s/p removal 08/14/2013 Dr. Michael Boston  2. 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 Imodium and lomotil maximized by Dr. Johney Maine. On TPN; Ongoing fistula output .  3. Sepsis-resolved  4. Afib on BB and chronic anticoagulation with Apixaban (Eliquis)  5. Hx of chronic combined diastolic and systolic CHF-stable  6. Diabetes mellitus-on SSI, lantus. CBGs are better. Appreciate IM assistance.  7. Stage III chronic renal disease-stable  8. Hypertension-improved, appreciate IM assistance.  9. Hyperlipidemia  10. Obesity, Class III, BMI 40-49.9 (morbid obesity)  11. 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 Deconditioning-pt walking in hallways now   Plan:  Reviewed with Dr. Marlou Starks, we will try him without suction and allow gravity drainage.  If this  works here in the hospital.  We do not feel safe applying any woundvac device at this point.   LOS: 62 days    Kurt Cross 10/12/2013

## 2013-10-12 NOTE — Progress Notes (Signed)
NUTRITION FOLLOW UP  Intervention:   Continue TPN per Pharmacy RD to continue monitor  Nutrition Dx:   Inadequate oral intake related to EC fistula as evidenced by NPO status; ongoing  Goal:   Pt to meet >/= 90% of their estimated nutrition needs; being met by TPN  Monitor:   Weights, labs, TPN, fistula output   Assessment:   63 y.o. male with known history of chronic systolic heart failure status post defibrillator placement last EF measured was in 2013 was 35-40%, on home oxygen, OSA noncompliant with CPAP, diabetes mellitus, hypertension and hyperlipidemia. CT scan revealed a large abdominal wall abscess.  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 TNA started 11/12 at 10pm.  11/13 - Pt reports decreased appetite but, per nursing notes pt was eating 100% of meals before being made NPO. Pt states that he weighed 368 lbs prior to previous hospitalization  11/20 - Per MD note moderate output continues from wound. Clinimix 5/15 is running at goal rate of 136ml/hr with 20% lipids at 52ml/hr; provides 150g protein and 2675 kcal daily. This meets 100% of estimated energy needs and 94% of estimated protein needs. Pt's weight has decreased 9 lbs in the past week and 23 lbs since admission.   11/25 - Clinimix 5/15 running at goal rate of 125 ml/hr with 20% lipids at 33ml/hr; provides 150g protein and 2675 kcal daily. This meets 100% of estimated energy needs and 94% of estimated protein needs. Weight loss is slowing down with 3 lb weight loss in the past 5 days.   12/2 - TPN was changed to cyclic 95/6. Per Pharmacy note, cyclic 18 hour infusion of Clinimix E 5/15, starting at 66ml/hr x 1 hour, then 181 ml/hr x 16 hours, then 50 ml/hr x 1 hr, then off; with lipids 20% at 13 ml/hr x 18 hours. Per chart pt will likely be discharged with TNA. Weight stable over past week. Per chart pt had a hypoglycemic event today.  12/9 - TPN was changed back to 24 hours. Clinimix E5/15 at goal rate 125 ml/hr  (3L/day) with 20% lipids at 10 ml/hr to provide 150g protein and 2675 kcal daily. This meets 94% of estimated protein needs and 101% of estimated energy needs. Pt continues to lose weight but, slowly-2 lb weight loss in past 2 weeks.   12/16 - Clinimix E5/15 at goal rate 125 ml/hr (3L/day) with 20% lipids at 10 ml/hr to provide 150 g protein and 2675 kcal daily. This meets 94% of estimated protein needs and 101% of estimated energy needs. Pt continues to lose weight with 5 lb weight loss in past 2 weeks and 18 lb weight loss since 08/12/13. Pt has lost 2% of his body weight in the past month- not significant.  12/23 - Clinimix E5/15 at goal rate 125 ml/hr (3L/day) with 20% lipids at 10 ml/hr to provide 150 g protein and 2675 kcal daily. This meets 94% of estimated protein needs and 101% of estimated energy needs.Pt being transferred out of room at time of visit. Pt's weight is up today- ? accuracy vs edema. Per MD note 12/22- pt appears to be getting stronger, ambulating with his walker down to the hallway.   12/30 - Reviewed events since last RD note. WOC RN noted half-digested pill expelled from fistula 12/24. Fistula output down from 826ml total 12/26 to 262ml total 12/27. Was 280ml total 12/28 and 224ml total 12/29. Pt's weight down 4 pounds since admission. Pt on phone in room.  1/6 - Reviewed events since last RD. Continues on TPN. Pt's weight up 22 pounds since admission. Pt with 283ml total fistula output yesterday.   TPN: Clinimix E 5/15 @ 125 ml/hr and lipids @ 10 ml/hr. Provides 150g protein and 2675 kcal daily. Meets 100% minimum estimated energy needs and 94% minimum estimated protein needs.  CBGs elevated PALB WNL AST/ALT WNL Triglycerides WNL    Height: Ht Readings from Last 1 Encounters:  08/14/13 6' (1.829 m)    Weight Status:   Wt Readings from Last 1 Encounters:  10/11/13 362 lb 3.2 oz (164.293 kg)  Admit wt:        340 lb 9.8 oz (154.5 kg)  Re-estimated needs:  Kcal:  2370-2650 Protein: 160-180 grams Fluid: 3.3-3.5 L/day  Skin: +1 Generalized edema, non-pitting RLE and LLE edema; abdominal incisions/wounds with closed system drains  Diet Order: NPO   Intake/Output Summary (Last 24 hours) at 10/12/13 1554 Last data filed at 10/12/13 1400  Gross per 24 hour  Intake   3100 ml  Output   1650 ml  Net   1450 ml    Last BM: 1/6   Labs:   Recent Labs Lab 10/07/13 0330 10/11/13 0357  NA 135* 135*  K 3.6* 3.9  CL 98 96  CO2 28 30  BUN 18 21  CREATININE 0.85 0.89  CALCIUM 9.0 8.9  MG 1.5 1.6  PHOS 4.9* 4.9*  GLUCOSE 133* 151*    CBG (last 3)   Recent Labs  10/12/13 0409 10/12/13 0717 10/12/13 1135  GLUCAP 125* 124* 163*    Scheduled Meds: . acetaminophen  1,000 mg Oral TID  . amLODipine  10 mg Oral Daily  . antiseptic oral rinse  15 mL Mouth Rinse q12n4p  . apixaban  5 mg Oral BID  . atorvastatin  40 mg Oral QODAY  . carvedilol  3.125 mg Oral BID WC  . clotrimazole   Topical BID  . diphenoxylate-atropine  1 tablet Oral QID  . ferrous sulfate  325 mg Oral TID  . furosemide  40 mg Oral Daily  . hydrALAZINE  50 mg Oral Q8H  . insulin aspart  0-15 Units Subcutaneous Q4H  . insulin glargine  30 Units Subcutaneous QHS  . isosorbide mononitrate  30 mg Oral Daily  . lip balm  1 application Topical BID  . neomycin-bacitracin-polymyxin   Topical BID  . OxyCODONE  20 mg Oral Q12H  . potassium chloride  40 mEq Oral Daily  . sodium chloride  3 mL Intravenous Q12H  . zolpidem  10 mg Oral QHS    Continuous Infusions: . sodium chloride 20 mL/hr at 10/11/13 1800  . [name] KitchenTPN (CLINIMIX-E) Adult 125 mL/hr at 10/11/13 1753   And  . fat emulsion 250 mL (10/11/13 1753)  . [name] KitchenTPN (CLINIMIX-E) Adult     And  . fat emulsion      Mikey College MS, RD, LDN [phone] Pager [phone] After Hours Pager

## 2013-10-12 NOTE — Progress Notes (Signed)
Physical Therapy Treatment Patient Details Name: VONZELL SIZEMORE MRN: [id number] DOB: [birth year] Today's Date: 10/12/2013 Time: 1205-1300 PT Time Calculation (min): 55 min  PT Assessment / Plan / Recommendation  History of Present Illness ARTEEN MORAITIS is a 63 y.o. male  with known history of chronic systolic heart failure status post defibrillator placement last EF measured was in 2013 was 35-40%, on home oxygen, OSA noncompliant with CPAP, diabetes mellitus, hypertension and hyperlipidemia presented to the ER because right leg pain after sustaining a fall the night prior to admission.  08/14/13: s/p abdominal abscess removal   PT Comments   Spouse present and assisted with session by pushing IV pole. Pt tolerated amb full unit with 3 sitting rest breaks.    Follow Up Recommendations        Does the patient have the potential to tolerate intense rehabilitation     Barriers to Discharge        Equipment Recommendations       Recommendations for Other Services    Frequency     Progress towards PT Goals Progress towards PT goals: Progressing toward goals  Plan      Precautions / Restrictions Precautions Precautions: Fall Precaution Comments: ostomy bag on R side and wall suction to ABD Restrictions Weight Bearing Restrictions: No    Pertinent Vitals/Pain No c/o pain    Mobility  Bed Mobility Bed Mobility: Not assessed Details for Bed Mobility Assistance: Pt OOB in recliner Transfers Transfers: Sit to Stand;Stand to Sit Sit to Stand: 2: Max assist Stand to Sit: 2: Max assist Details for Transfer Assistance: pt uses momentum forward motion to stand and tends to "plop" uncontrolled with sitting.  Requires increased assist with standing from lower surface recliner chair.  Ambulation/Gait Ambulation/Gait Assistance: 4: Min guard;5: Supervision Ambulation Distance (Feet): 500 Feet (175 feet x 3 ) Assistive device: Rolling walker Ambulation/Gait Assistance Details: spouse present  and assisted by pushing IV pole.  Recliner chair closely following.  Pt tolerating increased amb distance between sitting rest break.  Gait Pattern: Step-through pattern;Wide base of support;Decreased stride length;Ataxic Gait velocity: decreased    PT Goals (current goals can now be found in the care plan section)    Visit Information  Last PT Received On: 10/12/13 Assistance Needed: +2 History of Present Illness: FREY KUE is a 63 y.o. male  with known history of chronic systolic heart failure status post defibrillator placement last EF measured was in 2013 was 35-40%, on home oxygen, OSA noncompliant with CPAP, diabetes mellitus, hypertension and hyperlipidemia presented to the ER because right leg pain after sustaining a fall the night prior to admission.  08/14/13: s/p abdominal abscess removal    Subjective Data      Cognition       Balance     End of Session  pt in recliner with spouse in room   Rica Koyanagi  PTA [facility]  Acute  Rehab Pager      [phone]

## 2013-10-12 NOTE — Progress Notes (Signed)
Seen and agree  

## 2013-10-13 LAB — GLUCOSE, CAPILLARY
GLUCOSE-CAPILLARY: 118 mg/dL — AB (ref 70–99)
GLUCOSE-CAPILLARY: 113 mg/dL — AB (ref 70–99)
Glucose-Capillary: 112 mg/dL — ABNORMAL HIGH (ref 70–99)
Glucose-Capillary: 142 mg/dL — ABNORMAL HIGH (ref 70–99)
Glucose-Capillary: 118 mg/dL — ABNORMAL HIGH (ref 70–99)

## 2013-10-13 MED ORDER — TRACE MINERALS CR-CU-F-FE-I-MN-MO-SE-ZN IV SOLN
INTRAVENOUS | Status: AC
  Administered 2013-10-13: 17:00:00 via INTRAVENOUS
  Filled 2013-10-13: qty 3000

## 2013-10-13 MED ORDER — FAT EMULSION 20 % IV EMUL
250.0000 mL | INTRAVENOUS | Status: AC
  Administered 2013-10-13: 250 mL via INTRAVENOUS
  Filled 2013-10-13: qty 250

## 2013-10-13 NOTE — Progress Notes (Signed)
PARENTERAL NUTRITION CONSULT NOTE - FOLLOW UP  Pharmacy Consult for TNA Indication: Enterocutaneous fistula  No Known Allergies  Patient Measurements: Height: 6' (182.9 cm) Weight: 414 lb 0.4 oz (187.8 kg) IBW/kg (Calculated) : 77.6 Adjusted Body Weight: 102.6kg  Vital Signs: Temp: 98 F (36.7 C) (01/07 0602) Temp src: Oral (01/07 0602) BP: 158/79 mmHg (01/07 0602) Pulse Rate: 77 (01/07 0602) Intake/Output from previous day: 01/06 0701 - 01/07 0700 In: 3560 [I.V.:320; TPN:3240] Out: 3200 [Urine:2600; Drains:600] Above numbers appear incomplete   Labs:  Recent Labs  10/11/13 0357  WBC 8.8  HGB 9.3*  HCT 28.7*  PLT 271    Recent Labs  10/11/13 0357  NA 135*  K 3.9  CL 96  CO2 30  GLUCOSE 151*  BUN 21  CREATININE 0.89  CALCIUM 8.9  MG 1.6  PHOS 4.9*  PROT 7.0  ALBUMIN 2.3*  AST 36  ALT 46  ALKPHOS 62  BILITOT 1.1  PREALBUMIN 18.1  TRIG 67  Calcium 10.4  Recent Labs  10/12/13 1937 10/12/13 2331 10/13/13 0352  GLUCAP 139* 125* 112*   Insulin Requirements in the past 24 hours:   CBGs 124-163; required 16 units of moderate-scale SSI.  TNA contains regular insulin 50 units per 3L bag (16.6 units/L, increased on 12/24)  Lantus 30 units QHS  Nutritional Goals:   Goals per RD 12/30:  2370-2650 kcal/day, 160-180 grams/day protein, 3.3-3.5 L/day fluid.  Clinimix E 5/15 at goal rate 125 ml/hr (3L/day) with 20% lipids at 10 ml/hr to provide 150g protein and 2675 kcal daily.  Due to TNA bag capacity, maximum volume is 3L per 24 hrs.  This rate will meet >/= 90% of estimated needs.  Current Nutrition:   NPO except for ice chips, sips with meds  Clinimix E 5/15 at 150ml/hr and 20% lipids at 10 ml/hr  IVF: NS at [facility]: 63 yo male with large abdominal abscess with infected old Gore Tex mesh s/p I&D abscess, removal of mesh, debridement of abdominal wall and peritoneum. 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 TNA started 11/12.   Patient currently unable to be discharged to a facility nearby that can accommodate both TNA and wound drainage system.   1/7:  Day #56 TNA. Pt remains NPO on 24 hour continuous TPN.  No issues per RN.  NO changes.   Labs:   Glucose: Hx DM, on insulin PTA. CBGs < 150 after increasing Novolog to moderate-scale SSI q4h 12/30. Continues Lantus qhs and insulin in TNA as outlined above.    Electrolytes: last labs on 1/5 - WNL except Phos = 4.9 (slightly elevated), Corr Ca x Phos= 50.    Renal:  SCr WNL on 1/5.LFTs: WNL 1/5  TGs: WNL on 1/5  Prealbumin: 10.3 (11/13), 11.1 (11/17), 11.2(11/24), 13.6 (12/1), 10.3 (12/8), 13.4 (12/15), 15.4 (12/22), 14.4 (12/29), 18.1 (1/5), improving slowly   Large fluctuations in weight daily - suspect measurement artifacts   Plan:   Continue Clinimix E 5/15 at 125 ml/hr with lipids 20% at 10 ml/hr over 24 hours.     Increase insulin from 50 to 60 units per 3L bag (20 units/L)  TNA to contain standard multivitamins and trace elements.  Continue CBG checks q4h, moderate SSI coverage .   Continue Lantus 30 units QHS.  KCl 31mEq PO daily per MD  Continue NS at [facility]  TNA lab panels on Mondays & Thursdays   Monitor Phos  F/U discharge plans and TNA arrangement.  Romeo Rabon,  PharmD, pager [phone]. 10/13/2013,7:40 AM.

## 2013-10-13 NOTE — Progress Notes (Signed)
Seen and agree  

## 2013-10-13 NOTE — Progress Notes (Signed)
Physical Therapy Treatment Patient Details Name: FARES MCGINITY MRN: [id number] DOB: [birth year] Today's Date: 10/13/2013 Time: [id number] PT Time Calculation (min): 40 min  PT Assessment / Plan / Recommendation  History of Present Illness YSIDRO ACHENBACH is a 63 y.o. male  with known history of chronic systolic heart failure status post defibrillator placement last EF measured was in 2013 was 35-40%, on home oxygen, OSA noncompliant with CPAP, diabetes mellitus, hypertension and hyperlipidemia presented to the ER because right leg pain after sustaining a fall the night prior to admission.  08/14/13: s/p abdominal abscess removal   PT Comments   Amb pt full unit approx 5oo feet needing 2 sitting rest breaks vs 3 last session.  Pt progressing with his mobility and endurance.  Follow Up Recommendations        Does the patient have the potential to tolerate intense rehabilitation     Barriers to Discharge        Equipment Recommendations       Recommendations for Other Services    Frequency     Progress towards PT Goals    Plan      Precautions / Restrictions Precautions Precautions: Fall Precaution Comments: Ostomy bag Restrictions Weight Bearing Restrictions: No   Pertinent Vitals/Pain     Mobility  Bed Mobility Bed Mobility: Not assessed (pt OOB in bed) Details for Bed Mobility Assistance: Pt OOB in recliner Transfers Transfers: Sit to Stand;Stand to Sit Sit to Stand: 2: Max assist Stand to Sit: 2: Max assist Ambulation/Gait Ambulation/Gait Assistance: 4: Min guard;5: Supervision Ambulation Distance (Feet): 500 Feet Assistive device: Rolling walker Ambulation/Gait Assistance Details: Pt amb full unit with only 2 sitting rest breaks vs needing 3 last session.  Chair following closely behind for safety as knees tend to buckle with fatigue.  Gait Pattern: Step-through pattern;Wide base of support;Decreased stride length;Ataxic Gait velocity: decreased General Gait Details: pt  requires sitting rest breaks but this session only required 2 vs 3 last session.  Pt demon increased activity tolerance and amb distance.     PT Goals (current goals can now be found in the care plan section)    Visit Information  Last PT Received On: 10/13/13 Assistance Needed: +2 History of Present Illness: TAAVON TRUONG is a 63 y.o. male  with known history of chronic systolic heart failure status post defibrillator placement last EF measured was in 2013 was 35-40%, on home oxygen, OSA noncompliant with CPAP, diabetes mellitus, hypertension and hyperlipidemia presented to the ER because right leg pain after sustaining a fall the night prior to admission.  08/14/13: s/p abdominal abscess removal    Subjective Data      Cognition       Balance     End of Session  in recliner with call light in reach   Rica Koyanagi  PTA [facility]  Acute  Rehab Pager      [phone]

## 2013-10-13 NOTE — Progress Notes (Signed)
Pt has refused CPAP for the night, Pt complaining of nosebleeds after wearing CPAP all night.  RT to monitor and assess as needed.

## 2013-10-13 NOTE — Progress Notes (Signed)
60 Days Post-Op  Subjective: No new complaints, site looks fine.  He is able to drain it without any trouble so far. Gas in bag now.  Objective: Vital signs in last 24 hours: Temp:  [98 F (36.7 C)-98.1 F (36.7 C)] 98 F (36.7 C) (01/07 0602) Pulse Rate:  [64-88] 77 (01/07 0602) Resp:  [18-20] 18 (01/07 0602) BP: (147-167)/(73-84) 158/79 mmHg (01/07 0602) SpO2:  [93 %-97 %] 96 % (01/07 0602) Weight:  [187.8 kg (414 lb 0.4 oz)] 187.8 kg (414 lb 0.4 oz) (01/07 0500) Last BM Date: 10/12/13 600 from drain yesterday Afebrile, VSS No labs Intake/Output from previous day: 01/06 0701 - 01/07 0700 In: 3560 [I.V.:320; TPN:3240] Out: 3200 [Urine:2600; Drains:600] Intake/Output this shift: Total I/O In: -  Out: 300 [Urine:300]  General appearance: alert, cooperative and no distress GI: large abdomen, open area looks good, fistula draining into [place].  Lab Results:   Recent Labs  10/11/13 0357  WBC 8.8  HGB 9.3*  HCT 28.7*  PLT 271    BMET  Recent Labs  10/11/13 0357  NA 135*  K 3.9  CL 96  CO2 30  GLUCOSE 151*  BUN 21  CREATININE 0.89  CALCIUM 8.9   PT/INR No results found for this basename: LABPROT, INR,  in the last 72 hours   Recent Labs Lab 10/07/13 0330 10/11/13 0357  AST 37 36  ALT 51 46  ALKPHOS 69 62  BILITOT 0.9 1.1  PROT 6.9 7.0  ALBUMIN 2.2* 2.3*     Lipase  No results found for this basename: lipase     Studies/Results: No results found.  Medications: . acetaminophen  1,000 mg Oral TID  . amLODipine  10 mg Oral Daily  . antiseptic oral rinse  15 mL Mouth Rinse q12n4p  . apixaban  5 mg Oral BID  . atorvastatin  40 mg Oral QODAY  . carvedilol  3.125 mg Oral BID WC  . clotrimazole   Topical BID  . diphenoxylate-atropine  1 tablet Oral QID  . ferrous sulfate  325 mg Oral TID  . furosemide  40 mg Oral Daily  . hydrALAZINE  50 mg Oral Q8H  . insulin aspart  0-15 Units Subcutaneous Q4H  . insulin glargine  30 Units Subcutaneous  QHS  . isosorbide mononitrate  30 mg Oral Daily  . lip balm  1 application Topical BID  . neomycin-bacitracin-polymyxin   Topical BID  . OxyCODONE  20 mg Oral Q12H  . potassium chloride  40 mEq Oral Daily  . sodium chloride  3 mL Intravenous Q12H  . zolpidem  10 mg Oral QHS    Assessment/Plan 1. Infected prosthetic mesh of abdominal wall with a large abscess s/p removal 08/14/2013 Dr. Michael Boston  2. 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 Imodium and lomotil maximized by Dr. Johney Maine. On TPN; Ongoing fistula output .  3. Sepsis-resolved  4. Afib on BB and chronic anticoagulation with Apixaban (Eliquis)  5. Hx of chronic combined diastolic and systolic CHF-stable  6. Diabetes mellitus-on SSI, lantus. CBGs are better. Appreciate IM assistance.  7. Stage III chronic renal disease-stable  8. Hypertension-improved, appreciate IM assistance.  9. Hyperlipidemia  10. Obesity, Class III, BMI 40-49.9 (morbid obesity)  11. 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 Deconditioning-pt walking in hallways now   Plan:  Will continue current Rx.  Take dressing down for next change with Wound care and see how it looks, measure undermining to determine if  we can continue this without suction to [place]'s pouch.   LOS: 63 days    Denine Brotz 10/13/2013

## 2013-10-14 DIAGNOSIS — E46 Unspecified protein-calorie malnutrition: Secondary | ICD-10-CM

## 2013-10-14 LAB — COMPREHENSIVE METABOLIC PANEL
ALBUMIN: 2.4 g/dL — AB (ref 3.5–5.2)
ALK PHOS: 58 U/L (ref 39–117)
ALT: 48 U/L (ref 0–53)
AST: 39 U/L — ABNORMAL HIGH (ref 0–37)
BILIRUBIN TOTAL: 1 mg/dL (ref 0.3–1.2)
BUN: 21 mg/dL (ref 6–23)
CHLORIDE: 98 meq/L (ref 96–112)
CO2: 30 mEq/L (ref 19–32)
CREATININE: 0.92 mg/dL (ref 0.50–1.35)
Calcium: 8.9 mg/dL (ref 8.4–10.5)
GFR calc non Af Amer: 90 mL/min — ABNORMAL LOW (ref >90)
Glucose, Bld: 99 mg/dL (ref 70–99)
POTASSIUM: 3.8 meq/L (ref 3.7–5.3)
Sodium: 138 mEq/L (ref 137–147)
Total Protein: 7.1 g/dL (ref 6.0–8.3)

## 2013-10-14 LAB — CBC
HCT: 28 % — ABNORMAL LOW (ref 39.0–52.0)
Hemoglobin: 9.5 g/dL — ABNORMAL LOW (ref 13.0–17.0)
MCH: 31.1 pg (ref 26.0–34.0)
MCHC: 33.9 g/dL (ref 30.0–36.0)
MCV: 91.8 fL (ref 78.0–100.0)
PLATELETS: 290 10*3/uL (ref 150–400)
RBC: 3.05 MIL/uL — ABNORMAL LOW (ref 4.22–5.81)
RDW: 14.7 % (ref 11.5–15.5)
WBC: 9.1 10*3/uL (ref 4.0–10.5)

## 2013-10-14 LAB — GLUCOSE, CAPILLARY
GLUCOSE-CAPILLARY: 108 mg/dL — AB (ref 70–99)
Glucose-Capillary: 110 mg/dL — ABNORMAL HIGH (ref 70–99)
Glucose-Capillary: 112 mg/dL — ABNORMAL HIGH (ref 70–99)
Glucose-Capillary: 112 mg/dL — ABNORMAL HIGH (ref 70–99)
Glucose-Capillary: 117 mg/dL — ABNORMAL HIGH (ref 70–99)
Glucose-Capillary: 123 mg/dL — ABNORMAL HIGH (ref 70–99)
Glucose-Capillary: 90 mg/dL (ref 70–99)

## 2013-10-14 LAB — PHOSPHORUS: Phosphorus: 5.3 mg/dL — ABNORMAL HIGH (ref 2.3–4.6)

## 2013-10-14 LAB — MAGNESIUM: MAGNESIUM: 1.6 mg/dL (ref 1.5–2.5)

## 2013-10-14 MED ORDER — ALTEPLASE 2 MG IJ SOLR
2.0000 mg | Freq: Once | INTRAMUSCULAR | Status: AC
  Administered 2013-10-14: 2 mg
  Filled 2013-10-14: qty 2

## 2013-10-14 MED ORDER — TRACE MINERALS CR-CU-F-FE-I-MN-MO-SE-ZN IV SOLN
INTRAVENOUS | Status: DC
  Administered 2013-10-14: 17:00:00 via INTRAVENOUS
  Filled 2013-10-14: qty 3000

## 2013-10-14 MED ORDER — FAT EMULSION 20 % IV EMUL
250.0000 mL | INTRAVENOUS | Status: DC
  Administered 2013-10-14: 250 mL via INTRAVENOUS
  Filled 2013-10-14: qty 250

## 2013-10-14 NOTE — Progress Notes (Signed)
Pt has now decided that he wants his CPAP on at midnight, RT to monitor and assess as needed.

## 2013-10-14 NOTE — Plan of Care (Signed)
Problem: Phase II Progression Outcomes Goal: Progress activity as tolerated unless otherwise ordered Outcome: Progressing Pt ambulated in halls yesterday.  Goal: Tolerating diet Outcome: Progressing Pt tolerating ice chips.

## 2013-10-14 NOTE — Progress Notes (Signed)
Pt has refused CPAP for the night, RT to monitor and assess as needed.  

## 2013-10-14 NOTE — Progress Notes (Signed)
Kurt Cross became active with [facility] just prior to this hospital admission. [facility] Management following for CHF and DM management. Discharge plan now for home with home health services. Spoke with and wife at bedside to confirm that Kurt Cross will follow up post hospital discharge. [facility] will not replace or interfere with services provided by home health. Left contact information at bedside. Will continue to follow. Inpatient RNCM aware [facility] following as well.  Marthenia Rolling, MSN, RN,BSN- [facility] [phone]

## 2013-10-14 NOTE — Progress Notes (Signed)
[facility]  Patient Status: Active with [facility] up to this admission  [facility] is providing the following services: HHRN, PT, Home [facility]fusion [facility] services for home TPN.  [facility] hospital team will follow pt while in hospital to [place] home when deemed appropriate.  [facility] Coordinator will provide in hospital teaching with wife regarding TPN to support independence at home.   If patient discharges after hours, please call [phone].   Larry Sierras 10/14/2013, 2:01 PM

## 2013-10-14 NOTE — Progress Notes (Signed)
PARENTERAL NUTRITION CONSULT NOTE - FOLLOW UP  Pharmacy Consult for TNA Indication: Enterocutaneous fistula  No Known Allergies  Patient Measurements: Height: 6' (182.9 cm) Weight: 415 lb 9.1 oz (188.5 kg) IBW/kg (Calculated) : 77.6 Adjusted Body Weight: 102.6kg  Vital Signs: Temp: 98.3 F (36.8 C) (01/08 0522) Temp src: Oral (01/08 0522) BP: 149/76 mmHg (01/08 0526) Pulse Rate: 64 (01/08 0522) Intake/Output from previous day: 01/07 0701 - 01/08 0700 In: 4960 [I.V.:640; TPN:4320] Out: [id number] [Urine:3425; Drains:700]   Labs: No results found for this basename: WBC, HGB, HCT, PLT, APTT, INR,  in the last 72 hours  Recent Labs  10/14/13 0430  NA 138  K 3.8  CL 98  CO2 30  GLUCOSE 99  BUN 21  CREATININE 0.92  CALCIUM 8.9  MG 1.6  PHOS 5.3*  PROT 7.1  ALBUMIN 2.4*  AST 39*  ALT 48  ALKPHOS 58  BILITOT 1.0  Calcium 10.2  Recent Labs  10/13/13 2347 10/14/13 0408 10/14/13 0720  GLUCAP 108* 110* 90   Insulin Requirements in the past 24 hours:   CBGs since insulin increased in TNA:  118, 108, 110, 90; has required no SSI since TNA insulin was increased.  TNA contains regular insulin 60 units per 3L bag (20 units/L, increased on 1/7)  Lantus 30 units QHS  Nutritional Goals:   Goals per RD 12/30:  2370-2650 kcal/day, 160-180 grams/day protein, 3.3-3.5 L/day fluid.  Clinimix E 5/15 at goal rate 125 ml/hr (3L/day) with 20% lipids at 10 ml/hr to provide 150g protein and 2675 kcal daily.  Due to TNA bag capacity, maximum volume is 3L per 24 hrs.  This rate will meet >/= 90% of estimated needs.  Current Nutrition:   NPO except for ice chips, sips with meds  Clinimix E 5/15 at 148ml/hr and 20% lipids at 10 ml/hr  IVF: NS at [place]: 63 yo male with large abdominal abscess with infected old Gore Tex mesh s/p I&D abscess, removal of mesh, debridement of abdominal wall and peritoneum. 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 TNA started 11/12.   Patient currently unable to be discharged to a facility nearby that can accommodate both TNA and wound drainage system.   1/8:  Day #57 TNA. Pt remains NPO on 24 hour continuous TPN.  No new problems reported.    Large fluctuations in weight daily - suspect measurement artifacts   I/O (+) 0.8 L   Labs:   Glucose: Hx DM, on insulin PTA. CBGs improved and now < 150 after increasing TNA insulin to 60 units/bag (20 units/L) yesterday.  However, has had one value < 100 this AM. Continues Lantus qhs and SSI.  Electrolytes: WNL except Phos = 5.3 (slightly elevated), Corr Ca x Phos= 54.    Renal:  SCr WNL. LFTs: WNL except slight elevation of AST.  TGs: WNL on 1/5  Prealbumin: 10.3 (11/13), 11.1 (11/17), 11.2(11/24), 13.6 (12/1), 10.3 (12/8), 13.4 (12/15), 15.4 (12/22), 14.4 (12/29), 18.1 (1/5), improving slowly   Plan:   Continue Clinimix E 5/15 at 125 ml/hr with lipids 20% at 10 ml/hr over 24 hours.     Continue insulin 60 units per 3L bag (20 units/L)  Watch CBGs closely.  Consider reducing insulin in TNA if continues to have readings < 100.  Recheck BMet and Phosphorus tomorrow AM.  TNA to contain standard multivitamins and trace elements.  Continue CBG checks q4h, moderate SSI coverage .   Continue Lantus 30 units QHS.  KCl 79mEq  PO daily per MD  Continue NS at [facility] on Mondays & Thursdays   F/U discharge plans and TNA arrangements.  Clayburn Pert, PharmD, BCPS Pager: [phone] 10/14/2013  9:54 AM    .

## 2013-10-14 NOTE — Progress Notes (Signed)
61 Days Post-Op  Subjective: No new complaints Has been of suction for 2 days  Objective: Vital signs in last 24 hours: Temp:  [98.3 F (36.8 C)-98.8 F (37.1 C)] 98.3 F (36.8 C) (01/08 0522) Pulse Rate:  [64-83] 64 (01/08 0522) Resp:  [18] 18 (01/08 0522) BP: (128-157)/(70-76) 149/76 mmHg (01/08 0526) SpO2:  [100 %] 100 % (01/08 0522) Weight:  [415 lb 9.1 oz (188.5 kg)] 415 lb 9.1 oz (188.5 kg) (01/08 0526) Last BM Date: 10/12/13  Intake/Output from previous day: 01/07 0701 - 01/08 0700 In: 4960 [I.V.:640; TPN:4320] Out: 4125 [Urine:3425; Drains:700] Intake/Output this shift:    Abdomen soft, non tender, wound under pouch looks clean, succus in bag  Lab Results:  No results found for this basename: WBC, HGB, HCT, PLT,  in the last 72 hours BMET  Recent Labs  10/14/13 0430  NA 138  K 3.8  CL 98  CO2 30  GLUCOSE 99  BUN 21  CREATININE 0.92  CALCIUM 8.9   PT/INR No results found for this basename: LABPROT, INR,  in the last 72 hours ABG No results found for this basename: PHART, PCO2, PO2, HCO3,  in the last 72 hours  Studies/Results: No results found.  Anti-infectives: Anti-infectives   Start     Dose/Rate Route Frequency Ordered Stop   10/02/13 1600  ciprofloxacin (CIPRO) tablet 500 mg     500 mg Oral 2 times daily 10/02/13 1340 10/08/13 2014   09/29/13 0000  vancomycin (VANCOCIN) 1,500 mg in sodium chloride 0.9 % 500 mL IVPB  Status:  Discontinued     1,500 mg 250 mL/hr over 120 Minutes Intravenous Every 12 hours 09/28/13 1115 10/02/13 1339   09/28/13 1200  piperacillin-tazobactam (ZOSYN) IVPB 3.375 g  Status:  Discontinued     3.375 g 12.5 mL/hr over 240 Minutes Intravenous 3 times per day 09/28/13 1113 10/02/13 1339   09/28/13 1200  vancomycin (VANCOCIN) 2,500 mg in sodium chloride 0.9 % 500 mL IVPB     2,500 mg 250 mL/hr over 120 Minutes Intravenous  Once 09/28/13 1115 09/28/13 1412   08/31/13 0800  vancomycin (VANCOCIN) 1,750 mg in sodium  chloride 0.9 % 500 mL IVPB  Status:  Discontinued     1,750 mg 250 mL/hr over 120 Minutes Intravenous Every 24 hours 08/31/13 0753 08/31/13 1417   08/16/13 0800  vancomycin (VANCOCIN) 2,000 mg in sodium chloride 0.9 % 500 mL IVPB  Status:  Discontinued     2,000 mg 250 mL/hr over 120 Minutes Intravenous Every 24 hours 08/15/13 1411 08/31/13 0753   08/15/13 1500  vancomycin (VANCOCIN) 2,000 mg in sodium chloride 0.9 % 500 mL IVPB     2,000 mg 250 mL/hr over 120 Minutes Intravenous  Once 08/15/13 1411 08/15/13 1713   08/14/13 1600  piperacillin-tazobactam (ZOSYN) IVPB 3.375 g  Status:  Discontinued     3.375 g 12.5 mL/hr over 240 Minutes Intravenous Every 8 hours 08/14/13 1543 08/31/13 1417      Assessment/Plan: s/p Procedure(s): INCISION AND DRAINAGE ABSCESS, mesh  (N/A)  Enterocutaneous fistula on TNA and bowel rest  Continue current care.  Hopefully Dr. Marlou Starks or the PA can be present at the wound dressing change today for measurements Continue TNA to maximize nutrition Continue PT and all other care  LOS: 64 days    Kenzlie Disch A 10/14/2013

## 2013-10-15 ENCOUNTER — Inpatient Hospital Stay (HOSPITAL_COMMUNITY): Payer: Medicare Other

## 2013-10-15 DIAGNOSIS — I10 Essential (primary) hypertension: Secondary | ICD-10-CM

## 2013-10-15 DIAGNOSIS — I4891 Unspecified atrial fibrillation: Secondary | ICD-10-CM

## 2013-10-15 DIAGNOSIS — E1065 Type 1 diabetes mellitus with hyperglycemia: Secondary | ICD-10-CM

## 2013-10-15 DIAGNOSIS — IMO0002 Reserved for concepts with insufficient information to code with codable children: Secondary | ICD-10-CM

## 2013-10-15 LAB — PHOSPHORUS: Phosphorus: 4.9 mg/dL — ABNORMAL HIGH (ref 2.3–4.6)

## 2013-10-15 LAB — BASIC METABOLIC PANEL
BUN: 22 mg/dL (ref 6–23)
CO2: 30 meq/L (ref 19–32)
CREATININE: 0.92 mg/dL (ref 0.50–1.35)
Calcium: 8.7 mg/dL (ref 8.4–10.5)
Chloride: 96 mEq/L (ref 96–112)
GFR calc non Af Amer: 90 mL/min — ABNORMAL LOW (ref >90)
Glucose, Bld: 120 mg/dL — ABNORMAL HIGH (ref 70–99)
Potassium: 3.8 mEq/L (ref 3.7–5.3)
Sodium: 137 mEq/L (ref 137–147)

## 2013-10-15 LAB — GLUCOSE, CAPILLARY
GLUCOSE-CAPILLARY: 126 mg/dL — AB (ref 70–99)
GLUCOSE-CAPILLARY: 103 mg/dL — AB (ref 70–99)
GLUCOSE-CAPILLARY: 150 mg/dL — AB (ref 70–99)
Glucose-Capillary: 115 mg/dL — ABNORMAL HIGH (ref 70–99)

## 2013-10-15 MED ORDER — FAT EMULSION 20 % IV EMUL
250.0000 mL | INTRAVENOUS | Status: DC
  Filled 2013-10-15: qty 250

## 2013-10-15 MED ORDER — HYDRALAZINE HCL 50 MG PO TABS: 50.0000 mg | ORAL_TABLET | Freq: Three times a day (TID) | ORAL | Status: DC

## 2013-10-15 MED ORDER — AMLODIPINE BESYLATE 10 MG PO TABS: 10.0000 mg | ORAL_TABLET | Freq: Every day | ORAL | Status: DC

## 2013-10-15 MED ORDER — OXYCODONE HCL ER 20 MG PO T12A: 20.0000 mg | EXTENDED_RELEASE_TABLET | Freq: Two times a day (BID) | ORAL | Status: DC

## 2013-10-15 MED ORDER — HEPARIN SOD (PORK) LOCK FLUSH 100 UNIT/ML IV SOLN
250.0000 [IU] | INTRAVENOUS | Status: AC | PRN
  Administered 2013-10-15: 250 [IU]

## 2013-10-15 MED ORDER — TRACE MINERALS CR-CU-F-FE-I-MN-MO-SE-ZN IV SOLN
INTRAVENOUS | Status: DC
  Filled 2013-10-15: qty 3000

## 2013-10-15 MED ORDER — APIXABAN 5 MG PO TABS: 5.0000 mg | ORAL_TABLET | Freq: Two times a day (BID) | ORAL | Status: DC

## 2013-10-15 MED ORDER — OCTREOTIDE ACETATE 100 MCG/ML IJ SOLN: 150.0000 ug | Freq: Three times a day (TID) | INTRAMUSCULAR | Status: DC

## 2013-10-15 MED ORDER — INSULIN ASPART 100 UNIT/ML ~~LOC~~ SOLN
0.0000 [IU] | Freq: Four times a day (QID) | SUBCUTANEOUS | Status: DC
  Administered 2013-10-15: 2 [IU] via SUBCUTANEOUS

## 2013-10-15 MED ORDER — INSULIN GLARGINE 100 UNIT/ML ~~LOC~~ SOLN: 35.0000 [IU] | Freq: Every day | SUBCUTANEOUS | Status: DC

## 2013-10-15 MED ORDER — FENTANYL 50 MCG/HR TD PT72
50.0000 ug | MEDICATED_PATCH | TRANSDERMAL | Status: DC
  Administered 2013-10-15: 50 ug via TRANSDERMAL
  Filled 2013-10-15: qty 1

## 2013-10-15 MED ORDER — ISOSORBIDE MONONITRATE ER 30 MG PO TB24: 30.0000 mg | ORAL_TABLET | Freq: Every day | ORAL | Status: DC

## 2013-10-15 MED ORDER — OXYCODONE HCL 15 MG PO TABS: 15.0000 mg | ORAL_TABLET | ORAL | Status: DC | PRN

## 2013-10-15 MED ORDER — CARVEDILOL 3.125 MG PO TABS: 3.1250 mg | ORAL_TABLET | Freq: Two times a day (BID) | ORAL | Status: DC

## 2013-10-15 MED ORDER — FUROSEMIDE 40 MG PO TABS: 40.0000 mg | ORAL_TABLET | Freq: Every day | ORAL | Status: DC

## 2013-10-15 NOTE — Discharge Summary (Signed)
Physician Discharge Summary  Patient ID: Kurt Cross MRN: [id number] DOB/AGE: 1951-06-19 63 y.o.  Admit date: 08/11/2013 Discharge date: 10/15/2013  Admission Diagnoses:  Right leg pain  Active Problems:  DM  HYPERLIPIDEMIA  OBSTRUCTIVE SLEEP APNEA  Hypertension  Unspecified constipation  Chronic combined systolic and diastolic CHF (congestive heart failure)  Leukocytosis    Discharge Diagnoses:  Infected prosthetic Gore-Tex mesh with a giant abdominal abscess status post IandD and Gore-Tex mesh removal 08/14/2013  Entero-atmospheric fistula Sepsis/hypotension Atrial Fibrillation/flutter with long term anticoagulation. Chronic Kidney disease stage III OSA, on CPAP and Home O2. AODM TYPE 2 with HBA1C 9.5 on admit. Chronic systolic and diastolic CHF, with recent  2D showing EF 50-55% Morbid obesity BMI 46.7  08/12/13. AAA 4.5 CM (CT scan 08/01/13) RLE pain Hyperlipidemia Malnutrition Deconditioning  Principal Problem:   Entero-atmospheric fistula Active Problems:   Diabetes mellitus, insulin dependent (IDDM), uncontrolled   HYPERLIPIDEMIA   Obesity, Class III, BMI 40-49.9 (morbid obesity)   OBSTRUCTIVE SLEEP APNEA   Hypertension   Acute-on-chronic respiratory failure secondary to probable community-acquired pneumonia   Constipation, chronic   Chronic combined systolic and diastolic CHF (congestive heart failure)   Right leg pain   AAA (abdominal aortic aneurysm)/ 4.5 cm ascending per CT angio 08/01/13   Infected prosthetic mesh of abdominal wall with GIANT abscess s/p removal 08/14/2013   Recurrent ventral incisional hernia   Acute renal failure (ARF)   Atrial fibrillation/flutter   PROCEDURES: INCISION AND DRAINAGE ABSCESS, GoreTex mesh removal,Debridement of abdominal wall & peritoneum 08/14/13, [name], MD.      Hospital Course: Kurt Cross is a 63 y.o. male  with known history of chronic systolic heart failure status post defibrillator placement  last EF measured was in 2013 was 35-40%, on home oxygen, OSA noncompliant with CPAP, diabetes mellitus, hypertension and hyperlipidemia presented to the ER because right leg pain after sustaining a fall the night prior to admission.  Patient was recently discharged on 08/05/2013 after being hospitalized for community acquired pneumonia. Patient stated he took all of his antibiotics as prescribed.  Patient states was using the bathroom the night prior to admission he bent over to get the brush of the floor his legs give out and he fell on his right side. Patient stated subsequently developed right lower extremity pain to the point he could not bear weight and his wife had to use a Hoyer lift to lift a month. Patient stated the nurse came by on the morning of admission he could not get out of bed or put weight on his leg and subsequently presented to the ED. Patient denies any syncope, no fever, no chills, no chest pain, no shortness of breath, no constipation, no melena, no hematemesis, no hematochezia. Patient did have an episode of loose stool however has been on stool softeners.  Patient presented to the ED x-Tedeschi of the right hip was negative. Patient states his right lower extremity pain is more behind his thigh and into his calf region. Basic metabolic profile done at his sodium of 133 chloride of 92 creatinine of 1.52 otherwise was within normal limits. CBC had a white count of 17.8 otherwise was within normal limits.  He was admitted by Medicine with the above issues.  He was found to be in Afib, 2D Echo showed EF 50-55%. He was seen by Dr. Charolette Forward and was going to be set up for TEE in the future and start on Eliquis for long term anticoagulation.  Temp spiked to 102.4 on 08/13/13, ongoing leukocytosis and new abdominal distension.  His UA and CXR were unremarkable.  A CT of the abdomen was ordered on 08/14/13 showing a 14.5 x 18.1 x 26.1 cm fluid/abscess collection within the Anterior wall of the  abdomen.  The collection is anterior to the ventral hernia mesh. Dr. Michael Boston was consulted at that point and pt had a history of bowel perforation requiring emergent laparotomy and colostomy.  The ostomy was then taken down and he developed an incisional hernia repaired by Dr. Elesa Hacker about 30 years ago.  No surgeries since that time reported. He had, had an appendectomy prior to this.  Exam showed a giant abdominal abscess sitting on top of his Mesh.  He was morbidly obese, poor diabetic control, and just quit smoking.  Dr. Johney Maine discussed issues and risk involved: "Emergent operation with drainage of abscess. Mesh will have to be removed at some point. Will need biologic mesh as a bridge. We will probably develop recurrent hernia and require major hernia repair and a possible panniculectomy given his giant panniculus. I discussed with the patient and his family. I warned him this will take multiple operations over the next year or so to get this under control. If he has bowel perforation, he will require resection and possible ostomy again. Recent anticoagulation with Eliquis. Increased bleeding risk. I do not think he can wait another day or so as this is quite massive for an abscess and he has obvious abdominal wall cellulitis erythema and now gas. High risk for necrotizing fasciitis if it has not already. Cardiology has cleared him for what it is worth." Dr. Johney Maine and Dr. Ninfa Linden took him to the OR.  He underwent, INCISION AND DRAINAGE ABSCESS  GoreTex mesh removal, Debridement of abdominal wall & peritoneum, in the evening of 08/14/13.  Pt had 60x50cm abd wall erythema,3L giant abscess, 90% deep to old GoreTex mesh. No evidence of intestinal/colonic perforation.  Wound measures 30cm by 20cm. It is 5-10cm deep. Undermining 5-8cm tunneled pockets at apex & base & RLQ.  See his note for more complete description of findings and surgery. He was placed in the ICU post op.  He was seen by Dr. Megan Salon ID,  and maintained on IV antibiotics    Wound dimensions on 08/16/13:  Wound dimensions: 23 x 9 x 3.5cm.   Undermining: 3 cm at 12:00 o'clock  7.5 cm at 3 o'clock, 6 cm at 6 o'clock, 5 cm at 9 o'clock. Picture is in EPIC. He was remaining in Atrial Flutter with controlled rate, restarted on anticoagulation and Dr. Terrence Dupont said he would see him after d/c. On 08/18/13 wet to dry dressing was changed and he had developed new green-brown colored drainage.  It had no odor but looked feculent. EC fistula was diagnosed at this point.  We had opted not to place a wound vac for fear of creating a fistula, this occurred anyway on wet to dry dressings. He was maintained on empiric broad spectrum antibiotics for 2 weeks at Dr. Storm Frisk (ID)  Recommendation.  An Eakin's pouch was applied to handle the increasing drainage. Picture 08/19/13. He was made NPO with the finding of a fistula and started ON TNA, via PICC line.  Repeat exam and picture 11/20.  Placement was not possible because volume of effluent from the fistula, required continuous drainage/suction.  We could find facilities that could provide drainage or TNA,but not both. The only LTAC facility available to his insurance  carrier was in [place], [place], and he choose not to go that far away.  He was maintained on CPAP, O2, TNA, OT, PT, Bariatric bed. Wound care by our Wound ostomy nursing team. Pain was also an issue and he was on Morphine, oxycodone and OxyContin. Sandostatin was used to help control the high output but was not very effective.  His nutrition status was monitored and ws 10.8 on TNA in December. He developed some fever and green sputum and his was addressed by Medicine 09/28/13 Wound measurements showed improvement:  Measurement: 16cm x 10cm x 1.5cm with undermined areas unchanged from 12/18: 2.5cm at 12 o'clock, 6cm at 3 o'clock, 4cm at 6 o'clock.  Wound bed:80% clean red granulation tissue with 20% loosely adhering yellow slough. 10% cleans  off easily, 10% remains  Drainage (amount, consistency, odor) thickening green effluent. Half-digested pill expelled from fistula while changing pouch. I cannot recognize pill. 09/29/13. By 10/05/13 the Open abdominal wound had formed a  stomatized enteric fistula.  Interim Summary by Dr. Karleen Hampshire is in progress notes 10/05/13. Repeat  measurements on 10/12/13:  Measurements: 15.5cm x 9.5cm x 1.5cm x 2.0cm undermining from 6-7 o'clock, no other areas undermined. At this point the pouch was changed to [facility] without suction.  Pt has done well and it was Dr. Ethlyn Gallery opinion he could go home with Eakin's pouch, TNA, PT/OT, home health.  Pt had 1.5 cm undermining at 12 o'clock, 1.5-2 cm at 6 o'clock, and 3 cm at 7 o'clock.  SNF was offered by patient want to go home so Home health is being arranged for him.  We have ask Medicine to review his medicines prior to discharge.  Dr. Olen Pel has reviewed them and we will let him follow up with Medicines are below.   Follow up with Dr. Johney Maine 10/22/13. He is to follow up with Dr. Terrence Dupont and Dr. Maudie Mercury.   CBC     WBC   9.1         WBC   RBC   3.05         RBC   Hemoglobin   9.5         Hemoglobin   HCT   28.0         HCT   MCV   91.8         MCV   MCH   31.1         MCH   MCHC   33.9         MCHC   RDW   14.7         RDW   Platelets   290            139 112 113 118 110 123 117 126 150  Glucose-Capillary    CHEM PROFILE     Sodium      138   137   Sodium   Potassium      3.8   3.8   Potassium   Chloride      98   96   Chloride   CO2      30   30   CO2   BUN      21   22   BUN   Creatinine, Ser      0.92   0.92   Creatinine, Ser   Calcium      8.9   8.7   Calcium   GFR calc non Af Wyvonnia Lora  90   90   GFR calc non Af Amer   GFR calc Af Amer      90 mL/min (NOTE) The eGFR has been calculated using the CKD EPI equation. This calculation has not been validated in all clinical situations. eGFR's persistently <90 mL/min signify possible Chronic Kidney  Disease.">9090 mL/min (NOTE) The eGFR has been calculated using the CKD EPI equation. This calculation has not been validated in all clinical situations. eGFR's persistently <90 mL/min signify possible Chronic Kidney Disease." border=0 src="file:///C:/PROGRAM%20FILES%20(X86)/EPIC/V7.9/EN-US/Images/IP_COMMENT_EXIST.gif" width=5 height=10   90 mL/min (NOTE) The eGFR has been calculated using the CKD EPI equation. This calculation has not been validated in all clinical situations. eGFR's persistently <90 mL/min signify possible Chronic Kidney Disease.">9090 mL/min (NOTE) The eGFR has been calculated using the CKD EPI equation. This calculation has not been validated in all clinical situations. eGFR's persistently <90 mL/min signify possible Chronic Kidney Disease." border=0 src="file:///C:/PROGRAM%20FILES%20(X86)/EPIC/V7.9/EN-US/Images/IP_COMMENT_EXIST.gif" width=5 height=10   GFR calc Af Amer   Glucose, Bld      99   120   Glucose, Bld   Phosphorus      5.3   4.9   Phosphorus   Magnesium      1.6      Magnesium   Alkaline Phosphatase      58      Alkaline Phosphatase   Albumin      2.4      Albumin   AST      39      AST   ALT      48      ALT   Total Protein      7.1      Total Protein   Total Bilirubin      1.0      Total Bilirubin    CBC    Prealbumin 10/10/13  18.1  Disposition: 06-Home-Health Care Svc      Discharge Orders   Future Appointments Provider Department Dept Phone   10/22/2013 11:45 AM [name], MD [facility] [phone]   Future Orders Complete By Expires   (HEART FAILURE PATIENTS) Call MD:  Anytime you have any of the following symptoms: 1) 3 pound weight gain in 24 hours or 5 pounds in 1 week 2) shortness of breath, with or without a dry hacking cough 3) swelling in the hands, feet or stomach 4) if you have to sleep on extra pillows at night in order to breathe.  As directed    Call MD for:  difficulty breathing, headache or visual disturbances  As  directed    Call MD for:  extreme fatigue  As directed    Call MD for:  hives  As directed    Call MD for:  persistant dizziness or light-headedness  As directed    Call MD for:  persistant nausea and vomiting  As directed    Call MD for:  redness, tenderness, or signs of infection (pain, swelling, redness, odor or green/yellow discharge around incision site)  As directed    Call MD for:  severe uncontrolled pain  As directed    Call MD for:  temperature >100.4  As directed    Discharge wound care:  As directed    Comments:     Large Eakin pouch bag over enterocutaneous fistula, change as needed.  Bag to be connected to continuous suction.  See discharge summary for further details.   Increase activity slowly  As directed  Medication List    STOP taking these medications       insulin NPH Human 100 UNIT/ML injection  Commonly known as:  HUMULIN N,NOVOLIN N     levofloxacin 750 MG tablet  Commonly known as:  LEVAQUIN     lisinopril 10 MG tablet  Commonly known as:  PRINIVIL,ZESTRIL     oxyCODONE-acetaminophen 5-325 MG per tablet  Commonly known as:  PERCOCET/ROXICET      TAKE these medications       acetaminophen 500 MG tablet  Commonly known as:  TYLENOL  Take 2 tablets (1,000 mg total) by mouth 3 (three) times daily.     alum & mag hydroxide-simeth 200-200-20 MG/5ML suspension  Commonly known as:  MAALOX/MYLANTA  Take 30 mLs by mouth every 6 (six) hours as needed for indigestion or heartburn (dyspepsia).     amLODipine 10 MG tablet  Commonly known as:  NORVASC  Take 1 tablet (10 mg total) by mouth daily.     antiseptic oral rinse Liqd  15 mLs by Mouth Rinse route 2 times daily at 12 noon and 4 pm.     apixaban 5 MG Tabs tablet  Commonly known as:  ELIQUIS  Take 1 tablet (5 mg total) by mouth 2 (two) times daily.     carvedilol 3.125 MG tablet  Commonly known as:  COREG  Take 1 tablet (3.125 mg total) by mouth 2 (two) times daily with a meal.     ferrous  sulfate 325 (65 FE) MG tablet  Take 1 tablet (325 mg total) by mouth 2 (two) times daily.     furosemide 40 MG tablet  Commonly known as:  LASIX  Take 1 tablet (40 mg total) by mouth daily.     hydrALAZINE 50 MG tablet  Commonly known as:  APRESOLINE  Take 1 tablet (50 mg total) by mouth every 8 (eight) hours.     insulin aspart 100 UNIT/ML injection  Commonly known as:  novoLOG  Inject 0-9 Units into the skin 3 (three) times daily with meals.     insulin glargine 100 UNIT/ML injection  Commonly known as:  LANTUS  Inject 0.35 mLs (35 Units total) into the skin at bedtime.     isosorbide mononitrate 30 MG 24 hr tablet  Commonly known as:  IMDUR  Take 1 tablet (30 mg total) by mouth daily.     loperamide 2 MG capsule  Commonly known as:  IMODIUM  Take 1 capsule (2 mg total) by mouth 2 (two) times daily.     loperamide 2 MG capsule  Commonly known as:  IMODIUM  Take 1-2 capsules (2-4 mg total) by mouth every 8 (eight) hours as needed for diarrhea or loose stools (Use if >2 BM every 8 hours).     octreotide 100 MCG/ML Soln injection  Commonly known as:  SANDOSTATIN  Inject 1.5 mLs (150 mcg total) into the skin 3 (three) times daily.     OxyCODONE 20 mg T12a 12 hr tablet  Commonly known as:  OXYCONTIN  Take 1 tablet (20 mg total) by mouth every 12 (twelve) hours.     oxyCODONE 15 MG immediate release tablet  Commonly known as:  ROXICODONE  Take 1 tablet (15 mg total) by mouth every 4 (four) hours as needed.     polyvinyl alcohol 1.4 % ophthalmic solution  Commonly known as:  LIQUIFILM TEARS  Place 2 drops into both eyes as needed for dry eyes.     potassium chloride  SA 20 MEQ tablet  Commonly known as:  K-DUR,KLOR-CON  Take 2 tablets (40 mEq total) by mouth daily.     rosuvastatin 20 MG tablet  Commonly known as:  CRESTOR  Take 20 mg by mouth every other day.     saccharomyces boulardii 250 MG capsule  Commonly known as:  FLORASTOR  Take 1 capsule (250 mg total) by  mouth 2 (two) times daily.     zolpidem 10 MG tablet  Commonly known as:  AMBIEN  Take 1 tablet (10 mg total) by mouth at bedtime.       Follow-up Information   Follow up with [name], MD In 3 weeks.   Specialty:  Cardiology   Contact information:   [place] [address] [phone]       Follow up with [name], MD. Schedule an appointment as soon as possible for a visit in 2 weeks. (from time of discharge)    Specialty:  Internal Medicine   Contact information:   [address] [place] [address] [phone]       Follow up with [name]., MD On 10/22/2013. (Be at office at 11:15 AM to see Dr. Johney Maine)    Specialty:  General Surgery   Contact information:   [address] [place] [address] [phone]       Follow up with [name], MD. Schedule an appointment as soon as possible for a visit in 2 weeks. (To follow up on your Atrial Fibrillation.)    Specialty:  Cardiology   Contact information:   [address] [place] [address] [phone]       Signed: Earnstine Regal 10/15/2013, 5:07 PM

## 2013-10-15 NOTE — Discharge Instructions (Signed)

## 2013-10-15 NOTE — Plan of Care (Signed)
Problem: Phase III Progression Outcomes Goal: IV changed to normal saline lock Outcome: Not Applicable Date Met:  28/41/32 Going home with a picc line  Problem: Discharge Progression Outcomes Goal: Complications resolved/controlled Outcome: Not Met (add Reason) Patient home with tna for nutrition Goal: Tolerating diet Outcome: Not Met (add Reason) Pt with TNA for nutrition

## 2013-10-15 NOTE — Progress Notes (Signed)
62 Days Post-Op  Subjective:   Pt had 1.5 cm undermining at 12 o'clock, 1.5-2 cm at 6 o'clock, and 3 cm at 7 o'clock. He took 10 mg of MS IV yesterday 75 mg oxyIR 40 mg OxyContin 12hr We tried to discuss his medicine use, and he said he would just take it we didn't have to send him home with anything.  He also says the insulin we give him doesn't work he will go to Dr. Maudie Mercury [name], MD) and get his insulin.  Objective: Vital signs in last 24 hours: Temp:  [98.1 F (36.7 C)-98.4 F (36.9 C)] 98.4 F (36.9 C) (01/09 0600) Pulse Rate:  [64-74] 74 (01/09 0600) Resp:  [18-20] 20 (01/09 0600) BP: (134-151)/(67-79) 150/79 mmHg (01/09 0600) SpO2:  [91 %-98 %] 91 % (01/09 0600) Weight:  [188.4 kg (415 lb 5.6 oz)] 188.4 kg (415 lb 5.6 oz) (01/09 0500) Last BM Date: 10/12/13  Intake/Output from previous day: 01/08 0701 - 01/09 0700 In: 4298.7 [I.V.:554.7; BQ:[id number] Out: 3325 [Urine:2575; Drains:750] Intake/Output this shift:    General appearance: alert, cooperative and no distress Resp: clear to auscultation bilaterally GI: large abdomen with picture above  Lab Results:   Recent Labs  10/14/13 1330  WBC 9.1  HGB 9.5*  HCT 28.0*  PLT 290    BMET  Recent Labs  10/14/13 0430 10/15/13 0525  NA 138 137  K 3.8 3.8  CL 98 96  CO2 30 30  GLUCOSE 99 120*  BUN 21 22  CREATININE 0.92 0.92  CALCIUM 8.9 8.7   PT/INR No results found for this basename: LABPROT, INR,  in the last 72 hours   Recent Labs Lab 10/11/13 0357 10/14/13 0430  AST 36 39*  ALT 46 48  ALKPHOS 62 58  BILITOT 1.1 1.0  PROT 7.0 7.1  ALBUMIN 2.3* 2.4*     Lipase  No results found for this basename: lipase     Studies/Results: No results found.  Medications: . acetaminophen  1,000 mg Oral TID  . amLODipine  10 mg Oral Daily  . antiseptic oral rinse  15 mL Mouth Rinse q12n4p  . apixaban  5 mg Oral BID  . atorvastatin  40 mg Oral QODAY  . carvedilol  3.125 mg Oral BID WC  .  clotrimazole   Topical BID  . diphenoxylate-atropine  1 tablet Oral QID  . ferrous sulfate  325 mg Oral TID  . furosemide  40 mg Oral Daily  . hydrALAZINE  50 mg Oral Q8H  . insulin aspart  0-15 Units Subcutaneous Q4H  . insulin glargine  30 Units Subcutaneous QHS  . isosorbide mononitrate  30 mg Oral Daily  . lip balm  1 application Topical BID  . neomycin-bacitracin-polymyxin   Topical BID  . OxyCODONE  20 mg Oral Q12H  . potassium chloride  40 mEq Oral Daily  . sodium chloride  3 mL Intravenous Q12H  . zolpidem  10 mg Oral QHS    Assessment/Plan 1. Infected prosthetic mesh of abdominal wall with a large abscess s/p removal 08/14/2013 Dr. Michael Boston  2. 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 Imodium and lomotil maximized by Dr. Johney Maine. On TPN; Ongoing fistula output .  3. Sepsis-resolved  4. Afib on BB and chronic anticoagulation with Apixaban (Eliquis)  5. Hx of chronic combined diastolic and systolic CHF-stable  6. Diabetes mellitus-on SSI, lantus. CBGs are better. Appreciate IM assistance.  7. Stage III chronic renal disease-stable  8. Hypertension-improved, appreciate IM assistance.  9. Hyperlipidemia  10. Obesity, Class III, BMI 40-49.9 (morbid obesity)  11. OSA-CPAP/ Home O2 PRN 12. PCM-TPN  13. Deconditioning-pt walking in hallways now   Plan:  Stop morphine, start a Fentanyl patch and ask Medicine to see and help with pain management as an outpatient, and insulin control on TNA.  Home Health is gearing up to help him, he told social services he's going home not a SNF.  The wound seems to be doing fairly well without the suction right now.  I will discuss with Dr. Marlou Starks      LOS: 65 days    Vinaya Sancho 10/15/2013

## 2013-10-15 NOTE — Progress Notes (Signed)
PARENTERAL NUTRITION CONSULT NOTE - FOLLOW UP  Pharmacy Consult for TNA Indication: Enterocutaneous fistula  No Known Allergies  Patient Measurements: Height: 6' (182.9 cm) Weight: 415 lb 5.6 oz (188.4 kg) IBW/kg (Calculated) : 77.6 Adjusted Body Weight: 102.6kg  Vital Signs: Temp: 98.4 F (36.9 C) (01/09 0600) Temp src: Oral (01/09 0600) BP: 150/79 mmHg (01/09 0600) Pulse Rate: 74 (01/09 0600) Intake/Output from previous day: 01/08 0701 - 01/09 0700 In: 4298.7 [I.V.:554.7; BQ:[id number] Out: 3325 [Urine:2575; Drains:750]   Labs:  Recent Labs  10/14/13 1330  WBC 9.1  HGB 9.5*  HCT 28.0*  PLT 290    Recent Labs  10/14/13 0430 10/15/13 0525  NA 138 137  K 3.8 3.8  CL 98 96  CO2 30 30  GLUCOSE 99 120*  BUN 21 22  CREATININE 0.92 0.92  CALCIUM 8.9 8.7  MG 1.6  --   PHOS 5.3* 4.9*  PROT 7.1  --   ALBUMIN 2.4*  --   AST 39*  --   ALT 48  --   ALKPHOS 58  --   BILITOT 1.0  --   Calcium 10.2  Recent Labs  10/14/13 2352 10/15/13 0401 10/15/13 0702  GLUCAP 112* 126* 103*   Insulin Requirements in the past 24 hours:   CBGs: 123, 112, 103; only 4 units of SSI.   TNA contains regular insulin 60 units per 3L bag (20 units/L, increased on 1/7)  Lantus 30 units QHS  Nutritional Goals:   Goals per RD 12/30:  2370-2650 kcal/day, 160-180 grams/day protein, 3.3-3.5 L/day fluid.  Clinimix E 5/15 at goal rate 125 ml/hr (3L/day) with 20% lipids at 10 ml/hr to provide 150g protein and 2675 kcal daily.  Due to TNA bag capacity, maximum volume is 3L per 24 hrs.  This rate will meet >/= 90% of estimated needs.  Current Nutrition:   NPO except for ice chips, sips with meds  Clinimix E 5/15 at 185ml/hr and 20% lipids at 10 ml/hr  IVF: NS at [facility]: 63 yo male with large abdominal abscess with infected old [place] mesh s/p I&D abscess, removal of mesh, debridement of abdominal wall and peritoneum. 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 TNA  started 11/12.  Patient currently unable to be discharged to a facility nearby that can accommodate both TNA and wound drainage system.   1/9: Day #58 TNA. Pt remains NPO on 24 hour continuous TPN.  No new problems reported.    Large fluctuations in weight daily - suspect measurement artifacts   I/O (+) ~1L  Labs:   Glucose: Hx DM, on insulin PTA. CBGs improved and now < 150 after increasing TNA insulin to 60 units/bag (20 units/L) yesterday. Continues Lantus qhs and SSI q4h.  Electrolytes: WNL. Phos improved but still slightly elevated, Corr Ca x Phos = 49.    Renal:  SCr WNL.  LFTs: WNL except slight elevation of AST(1/8)  TGs: WNL on 1/5  Prealbumin: 10.3 (11/13), 11.1 (11/17), 11.2(11/24), 13.6 (12/1), 10.3 (12/8), 13.4 (12/15), 15.4 (12/22), 14.4 (12/29), 18.1 (1/5), improving slowly   Plan:   Continue Clinimix E 5/15 at 125 ml/hr with lipids 20% at 10 ml/hr over 24 hours.     Continue insulin 60 units per 3L bag (20 units/L)  Watch CBGs closely.  Consider reducing insulin in TNA if continues to have readings < 100.  Recheck BMet and Phosphorus tomorrow AM.  TNA to contain standard multivitamins and trace elements.  Liberalize CBGs/SSI to q6h.  Continue Lantus 30 units QHS.  KCl 43mEq PO daily per MD.  Continue NS at [facility].  TNA lab panels on Mondays & Thursdays   F/U discharge plans and TNA arrangements.  Romeo Rabon, PharmD, pager [phone]. 10/15/2013,8:15 AM.

## 2013-10-15 NOTE — Progress Notes (Signed)
[facility] PROGRESS NOTE  Kurt Cross [id number] DOB: Sep 01, 1951 DOA: 08/11/2013 PCP: [name], MD  Please note following changes made to blood pressure medication regimen: 1. Dose of Coreg decreased from 6.25 mg BID --> 3.125 mg BID 2. Lasix dose changed form 80 mg QD to 40 mg QD 3. Hydralazine dose 50 mg PO TID 4. Imdur dose 30 mg QD 5. Pt will continue taking Norvasc 10 mg PO QD  Please note that pt is on the above antihypertensive regimen: Coreg, Lasix, Hydralazine, Imdur, Norvasc   Regarding diabetes, pt is on Lantus 30 Units QHS with sliding scale insulin   Pt also currently on Eliquis 5 mg PO BID for atrial fibrillation  Pain medical regimen consists of OxyContin 20 mg PO BID and Roxicodone 15 mg Q4 hours as needed    Principal Problem:   Entero-atmospheric fistula Active Problems:   Diabetes mellitus, insulin dependent (IDDM), uncontrolled   HYPERLIPIDEMIA   Obesity, Class III, BMI 40-49.9 (morbid obesity)   OBSTRUCTIVE SLEEP APNEA   Hypertension   Acute-on-chronic respiratory failure secondary to probable community-acquired pneumonia   Constipation, chronic   Chronic combined systolic and diastolic CHF (congestive heart failure)   Right leg pain   AAA (abdominal aortic aneurysm)/ 4.5 cm ascending per CT angio 08/01/13   Infected prosthetic mesh of abdominal wall with GIANT abscess s/p removal 08/14/2013   Recurrent ventral incisional hernia   Acute renal failure (ARF)   Atrial fibrillation/flutter  HPI/Subjective: No events overnight.   Objective: Filed Vitals:   10/14/13 2215 10/15/13 0500 10/15/13 0600 10/15/13 1629  BP: 151/74  150/79 171/94  Pulse: 64  74 71  Temp: 98.1 F (36.7 C)  98.4 F (36.9 C) 97.9 F (36.6 C)  TempSrc: Oral  Oral   Resp: 18  20 20   Height:      Weight:  188.4 kg (415 lb 5.6 oz)    SpO2: 96%  91% 97%    Intake/Output Summary (Last 24 hours) at 10/15/13 1642 Last data filed at 10/15/13 1637  Gross per 24 hour   Intake 3839.99 ml  Output   4925 ml  Net -1085.01 ml    Exam:   General:  Pt is alert, follows commands appropriately, not in acute distress  Cardiovascular: Regular rate and rhythm, S1/S2, no murmurs, no rubs, no gallops  Respiratory: Clear to auscultation bilaterally, no wheezing, no crackles, no rhonchi  Data Reviewed: Basic Metabolic Panel:  Recent Labs Lab 10/11/13 0357 10/14/13 0430 10/15/13 0525  NA 135* 138 137  K 3.9 3.8 3.8  CL 96 98 96  CO2 30 30 30   GLUCOSE 151* 99 120*  BUN 21 21 22   CREATININE 0.89 0.92 0.92  CALCIUM 8.9 8.9 8.7  MG 1.6 1.6  --   PHOS 4.9* 5.3* 4.9*   Liver Function Tests:  Recent Labs Lab 10/11/13 0357 10/14/13 0430  AST 36 39*  ALT 46 48  ALKPHOS 62 58  BILITOT 1.1 1.0  PROT 7.0 7.1  ALBUMIN 2.3* 2.4*   CBC:  Recent Labs Lab 10/11/13 0357 10/14/13 1330  WBC 8.8 9.1  NEUTROABS 5.5  --   HGB 9.3* 9.5*  HCT 28.7* 28.0*  MCV 93.2 91.8  PLT 271 290   CBG:  Recent Labs Lab 10/14/13 1950 10/14/13 2352 10/15/13 0401 10/15/13 0702 10/15/13 1212  GLUCAP 117* 112* 126* 103* 150*    Scheduled Meds: . acetaminophen  1,000 mg Oral TID  . amLODipine  10 mg Oral Daily  .  antiseptic oral rinse  15 mL Mouth Rinse q12n4p  . apixaban  5 mg Oral BID  . atorvastatin  40 mg Oral QODAY  . carvedilol  3.125 mg Oral BID WC  . clotrimazole   Topical BID  . diphenoxylate-atropine  1 tablet Oral QID  . fentaNYL  50 mcg Transdermal Q72H  . ferrous sulfate  325 mg Oral TID  . furosemide  40 mg Oral Daily  . hydrALAZINE  50 mg Oral Q8H  . insulin aspart  0-15 Units Subcutaneous Q6H  . insulin glargine  30 Units Subcutaneous QHS  . isosorbide mononitrate  30 mg Oral Daily  . lip balm  1 application Topical BID  . neomycin-bacitracin-polymyxin   Topical BID  . OxyCODONE  20 mg Oral Q12H  . potassium chloride  40 mEq Oral Daily  . sodium chloride  3 mL Intravenous Q12H  . zolpidem  10 mg Oral QHS   Continuous Infusions: .  sodium chloride 20 mL/hr at 10/14/13 2018  . [name] KitchenTPN (CLINIMIX-E) Adult 125 mL/hr at 10/14/13 1724   And  . fat emulsion 250 mL (10/14/13 1724)  . [name] KitchenTPN (CLINIMIX-E) Adult     And  . fat emulsion       Faye Ramsay, MD  [facility] Pager [phone]  If 7PM-7AM, please contact night-coverage [email or web address] Password [email or web address] 10/15/2013, 4:42 PM   LOS: 65 days

## 2013-10-15 NOTE — Progress Notes (Signed)
Pt discharged via wheelchair to home.  Belongings, dc instructions, and prescriptions given to patient

## 2013-10-15 NOTE — Progress Notes (Signed)
Pt placed on Auto CPAP 5-20 CMH2O with 3 LPM O2 bleed in via FFM.  Pt tolerating well at this time, RT to monitor and assess as needed.

## 2013-10-15 NOTE — Consult Note (Signed)
WOC wound follow up Wound type: surgical wound with stomatized EC fistula. Requested pouch change with CCS today to evaluate the wound and fistula since he has been off of continuous suction for 2 days. The output is liquid enough he and the staff have been able to empty via the spigot on the pouch.   Measurement:15.5cm x 9.5cm x 2.0cm with undermining at 12 o'clock (2.0 cm), undermining at 7 o'clock (3.0 cm) and undermining at 6 o'clock (2.0 cm) Wound bed:90% beefy red granulation tissue, 10% fibrin and some sloughing noted. Budded stomatized fistula at 4-5 o'clock.  Drainage (amount, consistency, odor) enteric contents, more yellow/green today, thin.   Periwound:intact, all other wounds healed at this time. Dressing procedure/placement/frequency: Used cut out section of the Eakin pouch to line (window frame) the wound with strips of Eakin.  Cut pouch to allow spigot to drain distally as much as the cutting surface would allow. Pattern placed in the patient fistula supply box in his belongings for [facility].  I did apply brief suction to the pouch to aid in the seal of the pouch at the time of application then I removed it from the pouch.  Window framed the Eakin pouch with hypafix tape per the patients preferences. Date placed for [facility] to be aware of the last change date.  Supported questions from the patient, he does not have any further questions for me today on his pouching.    [place] team will remain available as needed [place] [phone]

## 2013-10-18 ENCOUNTER — Telehealth (INDEPENDENT_AMBULATORY_CARE_PROVIDER_SITE_OTHER): Payer: Self-pay

## 2013-10-18 DIAGNOSIS — K439 Ventral hernia without obstruction or gangrene: Secondary | ICD-10-CM

## 2013-10-18 NOTE — Telephone Encounter (Signed)
[facility]/Chris Kennedy# [id number] states she is not able to draw labs thru picc line, patient refused peripheral draw, and refused  hospital to draw labs. Patient states he will have them done on Friday at [place]. I advise Rn we do not draw labs here, he  will be sent to the lab of choice for labs

## 2013-10-20 NOTE — Addendum Note (Signed)
Addended by: Illene Regulus on: 10/20/2013 10:37 AM   Modules accepted: Orders

## 2013-10-20 NOTE — Telephone Encounter (Signed)
Returned [facility]/Chris call back about the pt getting labs drawn but they got this resolved by sending another nurse the next day to get the labs for the TPN. I asked who was managing the TPN and they stated that it was coming from the [facility]. I stated that the pt's medical doctor needed to be involved with the TPN not Dr Johney Maine. I advised that we are following the pt for his wound and surgical isssues but we need help on the TPN. I advised that Dr [name] is listed in pt's chart as PCP but not sure if this is correct. Gerald Stabs will make note to contact PCP on mananging the TPN. We are seeing the pt on Friday 10/22/13 so we will send notes to the PCP at that time once we see the pt again.

## 2013-10-21 NOTE — Telephone Encounter (Signed)
The pt called today stating he had to request a new home care nurse because they couldn't draw his blood and he is in a lot of pain when they tried to change his bandages.  He states he needs a medical bed because it hurts to get up and down out of bed and his family has to help him.  He states there should be some papers here from [facility] for Dr Johney Maine to sign so they can get the bed out there.  Please advise patient.

## 2013-10-22 ENCOUNTER — Ambulatory Visit (INDEPENDENT_AMBULATORY_CARE_PROVIDER_SITE_OTHER): Payer: Medicare Other | Admitting: Surgery

## 2013-10-22 ENCOUNTER — Telehealth (INDEPENDENT_AMBULATORY_CARE_PROVIDER_SITE_OTHER): Payer: Self-pay

## 2013-10-22 ENCOUNTER — Encounter (INDEPENDENT_AMBULATORY_CARE_PROVIDER_SITE_OTHER): Payer: Self-pay | Admitting: Surgery

## 2013-10-22 VITALS — BP 160/88 | HR 72 | Temp 98.4°F | Resp 15

## 2013-10-22 DIAGNOSIS — K90829 Short bowel syndrome, unspecified: Secondary | ICD-10-CM

## 2013-10-22 DIAGNOSIS — K912 Postsurgical malabsorption, not elsewhere classified: Secondary | ICD-10-CM | POA: Insufficient documentation

## 2013-10-22 DIAGNOSIS — K632 Fistula of intestine: Secondary | ICD-10-CM

## 2013-10-22 DIAGNOSIS — K432 Incisional hernia without obstruction or gangrene: Secondary | ICD-10-CM

## 2013-10-22 DIAGNOSIS — T8579XA Infection and inflammatory reaction due to other internal prosthetic devices, implants and grafts, initial encounter: Secondary | ICD-10-CM

## 2013-10-22 HISTORY — DX: Short bowel syndrome, unspecified: K90.829

## 2013-10-22 HISTORY — DX: Postsurgical malabsorption, not elsewhere classified: K91.2

## 2013-10-22 MED ORDER — LOPERAMIDE HCL 2 MG PO CAPS: 2.0000 mg | ORAL_CAPSULE | Freq: Three times a day (TID) | ORAL | Status: DC | PRN

## 2013-10-22 NOTE — Patient Instructions (Signed)
Without hole in her intestine, essentially have short bowel syndrome.  You most likely will continue to need IV nutrition to make sure you have adequate nutrition.  If you are output is low, it is okay to try to eat some food and regulate.  Consider taking Imodium antidiarrheal pills 4 times a day to slow down the output of the ostomy  He will require an operation to fix the hole in her intestine and the giant recurrent hernia.  We recommend a major academic center.  [facility] he seems most likely option.  We will work to help set up an appointment for you  Short Bowel Syndrome Short bowel syndrome is a group of problems affecting people who have had half or more of their small intestine removed. The most common reason for removing part of the small intestine is to treat Crohn's disease. SYMPTOMS   Diarrhea is the main symptom of short bowel syndrome.  Other symptoms include:  Cramping.  Bloating.  Heartburn. Many people with short bowel syndrome are malnourished because their remaining small intestine is unable to absorb enough water, vitamins, and other nutrients from food. They may also become dehydrated, which can be life-threatening. Problems associated with dehydration and malnutrition include:  Weakness.  Weight loss.  Fatigue.  Bacterial infections.  Depression.  Food sensitivities. TREATMENT  Short bowel syndrome is treated through:  Changes in diet.  Intravenous feeding.  Vitamin and mineral supplements.  Medicine to relieve symptoms. Document Released: 09/05/2004 Document Revised: 12/16/2011 Document Reviewed: 01/01/2006 [facility] Patient Information 2014 [place].  Dietary Guidelines Eat 6 to 8 small meals a day Eating small, frequent meals will put less stress on your shortened bowel. Small meals help control your symptoms, and will result in better digestion and absorption.  Eat slowly and chew your food well.  Once your bowel has adapted, you can resume  having 3 meals a day. Only drink  cup (4 ounces) of liquids during each meal Drinking large amounts of liquids with meals pushes your food through your bowel at a faster rate. This means that you may not digest or absorb enough nutrients.  You can slow down this process by drinking most of your liquids between meals, at least 1 hour before or after a meal. Include enough nutrients in your meals to help you heal. Your meals should be: High in proteins, such as:  Education officer, community (chicken, [place])  Meat (beef, pork)  Eggs  Tofu  Dairy products  Smooth peanut butter and other nut butters, such as almond butter High in refined or low-fiber complex carbohydrates (starches), such as:  White bread  Cereals such as Rice Krispies and corn flakes  Potatoes without skin  White rice  Pasta Moderate in fats, such as:  Oils  Butter  Margarine  Mayonnaise  Gravies  Cream sauces  Regular salad dressings If you had a large section of your ileum (the last section of your small intestine) removed, you may tolerate larger amounts of fat at breakfast time better than later on in the day. Low in concentrated sweets, such as:  Sugar (cookies, cakes, candies, chocolate, soda, instant teas, fruit drinks)  Corn syrup  Molasses  Honey  Pancake syrup You can use artificial sweeteners like Splenda or SweetN' Low. However, limit your intake of sugar-free candies or cough drops that contain sorbitol or xylitol, as taking large amounts may have a laxative effect (make you have a bowel movement). Include enough liquids in your diet Try to drink  at least 8 full glasses of liquids each day.  Avoid very hot or cold drinks.  It may be better for you to stick with drinks that do not have a lot of sugar. This will prevent dehydration. Plain drinks include water, coffee, tea, milk, or juices diluted with water. (Caution: coffee may have a laxative effect on some people.) Follow a low-lactose diet if you are lactose  intolerant Lactose is a sugar that is found in dairy products. It can cause gas, cramps, and diarrhea in some people. These symptoms usually begin within the first 30 minutes of taking a dairy product.  To see if you can tolerate lactose, start by drinking  cup of milk. If you have any symptoms, try lactose-free dairy products, such as Lactaid milk or almond, rice, or soy milk. You can also use Lactaid tablets or Lactaid drops to help you digest dairy items.  Some foods have less lactose than others. Try cultured yogurt and aged cheeses (e.g., hard cheeses such as cheddar and Swiss). If you can eat those, try soft cheeses (e.g., cream cheese and cottage cheese). If you are still having symptoms, you may want to avoid all dairy products for 1 or 2 months and then try them again. Follow a low-oxalate diet Patients who have had their ileum removed and have an intact colon may need to follow a low-oxalate diet. This diet prevents kidney stones from forming. Foods that are high in oxalates include:  Tea  Coffee  Cola drinks  Chocolate  Nuts  Soy products  Green leafy vegetables  Sweet potatoes  Celery  Berries  Tangerines  Rhubarb  [place]  Wheat germ Your doctor or dietitian will discuss this diet with you, if it is necessary. Fiber After your surgery, you may find that fiber, particularly insoluble fiber, is hard to digest. Insoluble fiber is found mainly in whole-grain and bran products. It can't be broken down by the body and increases the bulk of stool (feces). Soluble fiber is usually tolerated better because it helps slow digestion. Foods with soluble fiber include oatmeal, oat bran, barley, soy, legumes (i.e., chickpeas, lima beans, kidney beans, and lentils), nut butters, fruit and fruit pectin, and psyllium (fiber supplement). Check with your doctor before taking any fiber supplements. Many foods contain a mixture of both soluble and insoluble fibers. Use the list below as a  guide. Soluble Fiber Foods to Include Insoluble Fiber Foods to Avoid Low-Fiber Foods to Include Difficult to Digest Foods to Avoid  Canned fruit  Fresh fruit without skins, peels, membranes, or seeds  Smooth peanut butter and other nut butters  Oatmeal and other oat products  Barley  Legumes, such as chickpeas, lima beans, kidney beans, and lentils, if tolerated* *Use with caution; may cause gas. Start with small, -cup portions to test your tolerance. Whole-wheat and whole-grain breads, crackers, cereals, and other products  Wheat bran Puffed wheat, puffed rice, corn flakes, Special K, and other cereals containing 1 gram or less of fiber per serving  Cream of wheat or rice  White rice  White bread, matzoh, and [place] bread without seeds Whole nuts, seeds, and coconut  Fruit skins, peels, and seeds  Dried fruits  Vegetables You will probably have difficulty digesting raw vegetables after your surgery. Start by eating small amounts ( cup) of well-cooked vegetables. Be sure to chew them well. Avoid those that cause gas or discomfort. If you can tolerate the cooked vegetables, try small amounts of the raw vegetables from the "  Usually Well-Tolerated" column below. Usually Well-Tolerated May Cause Gas or Discomfort  Carrots  Green beans  Spinach  Beets  Potatoes without skin  Asparagus tips  Lettuce  Cucumbers without skin or seeds  Tomato sauce and tomatoes without skin or seeds  Squash without skin or seeds Onions  Cauliflower  Broccoli  Cabbage  Brussels sprouts  Legumes, such as lentils, chickpeas, lima beans, and kidney beans  Corn  Kale  Peas  Vitamin and Mineral Supplements You are probably not eating some of the foods you did before your surgery. It may be a good idea to take 1 multivitamin each day. It should have the recommended daily allowance (RDA) for vitamins and minerals. You may also need more of specific vitamins or minerals. Ask your doctor or dietitian what you  should take. Vitamins A, D, and E You may need water-soluble forms of vitamins A, D, and E. This is rare and happens only if a large portion of your ileum was removed. Vitamin B12 If the last part of your ileum was removed, you will need to have injections (shots) of vitamin B12 every 1 to 3 months. Calcium You may need extra calcium, especially if most of your ileum was removed and most of your colon was left intact. Potassium If you are having a lot of diarrhea, your potassium levels may decrease. Ask your doctor if you should eat foods that are high in potassium. These include oranges, potatoes, tomatoes, and bananas.  Do not take potassium supplements without talking to your doctor first. They can be dangerous. Zinc If you having a lot of diarrhea, ask your doctor if you should take a zinc supplement. Oral rehydration solution If you are having a lot of diarrhea, your doctor may recommend that you drink an oral rehydration solution. This will give you back the liquid, sodium, and potassium that is lost with your bowel movements.  This is not the same as sports drinks (such as Gatorade), which do not have the same amount of nutrients. Liquid nutritional supplements If you are losing weight, a high-calorie liquid nutritional supplement may be helpful. However, depending on your surgery, some supplements may not be right for you. This is mainly because they have a high sugar content.  Your doctor or dietitian may recommend a special supplement for you. Always ask them before taking a supplement.  If you can take dairy products, try the fortified recipe below. It increases the amount of calories and protein in your diet. Fortified Milk Recipe Mix 1 quart of whole, 2%, 1%, skim, or Lactaid milk with 1 ? cup of instant powdered milk (usually 1 envelope). Blend the ingredients well. Keep refrigerated. Nutritional value for every 8-ounce serving if mixed with: Whole milk: 230 calories, 16 grams  of protein  2% milk: 200 calories, 16 grams of protein  1% milk: 180 calories, 16 grams of protein  Skim milk: 160 calories, 16 grams of protein Food Diary Guidelines Keeping a food diary is a helpful way to find out what foods are best for you. Keep a food diary with the following information: The time you ate the meal, snack, or beverage  The name of the food item or beverage  The amount of the food you ate or the beverage you drank  Any symptoms you had If you have an ileostomy or colostomy, it is also helpful to record your fluid output. Measure the amount of stool in your bag for 1 week. Measure it each time you  change or empty the bag. Then, if it is about the same each day, measure it once a month for a day or two. If you see a change in the consistency of the stool, measure it more often and tell your doctor. Sample Menu Breakfast: 7:00 am Liquid Break: 8:30 am  1 hard-boiled egg  1 slice of white toast  1 pat of butter  1 teaspoon of sugar-free fruit spread   cup of orange juice mixed with  cup of water 1 cup of fortified milk (see the recipe above)   Mid-Morning Snack: 10:00 am Liquid Break: 11:30 am  1 ounce of cheddar cheese  6 saltine crackers   cup of fortified milk  cup of juice mixed with  cup of water   Lunch: 1:00 pm Liquid Break: 2:30 pm  2 ounces of [place] on  of a white roll  2 teaspoons of mayonnaise   cup of tomato juice 1 cup of water   Mid-afternoon Snack: 4:00 pm Liquid Break: 5:30 pm  8 ounces of "light" or sugar-free yogurt 1 cup of fortified milk   Dinner: 7:00 pm Liquid Break: 8:30 pm  2 ounces of grilled chicken   cup of mashed potatoes   cup of cooked green beans  2 teaspoons of butter   cup of water  cup of juice mixed with  cup of water   Evening Snack: 10:00 pm  2 melba toasts with 1 tablespoon of peanut butter   cup of fortified milk     Dietary Management for Short Bowel Syndrome: What You Eat Does Matter  Alvan Dame,  PhD, RD, LDN, FADA, CNSD We eat both for the nutrients food provides and for the pleasure of eating. But if you have short bowel syndrome (SBS), the type of food you eat and the way you consume foods are especially important, as these factors will affect your absorption. This article will review the types of foods you should consume and the way you should consume them if you have SBS.  Short Bowel Syndrome What exactly is SBS? And what do we mean by "short"? The normal small bowel length in adults ranges from 300 to 800 cm. Loss of two-thirds or more of the small bowel is defined as SBS. However, as many of you know, it is not just the length that matters, but how well the remaining bowel functions. So you may not have much bowel, but what you have works pretty well. Or you may have plenty of bowel, but it is diseased and doesn't function optimally. Diet Dietary modification is the foundation of therapy for someone with SBS. This is one area where you, the home parenteral and/or enteral (homePEN) consumer, have total control. You can decide what you eat, how much, and when. Making small adjustments in your diet will help you manage your ostomy output or bowel movements. This, in turn, may allow you to reduce your dependence on parenteral nutrition (PN).  GI Anatomy Let's start with a few basic concepts. Your diet must be based on your own gastrointestinal anatomy. If you have SBS and your colon is in circuit, you will maximize your absorption if you follow a diet that is low in simple carbohydrates and low in fat. If you have SBS and a jejunostomy, there is no need to restrict fat, but limiting the simple carbohydrates will help to decrease the ostomy output.  Carbohydrates What is a simple carbohydrate and what is a complex carbohydrate? A simple carbohydrate  consists of two sugar molecules that are hooked together to form a disaccharide. Examples of these are sucrose (table sugar) and lactose (milk  sugar). These are found in foods such as candy, cakes, cookies, pies, regular soda pop, jelly, jam, syrup, ice cream, sherbet, and sorbet. If you have SBS and you consume large amounts of these simple carbohydrates, you may experience more diarrhea.  Complex carbohydrates are composed of large starch molecules. When consumed, complex carbohydrates generally do not produce a lot of diarrhea. Complex carbohydrates are found in pasta, potatoes, breads, cereals, whole grains, fruits, and vegetables.  It might be tempting to consume foods that are "low carb." However, these foods often contain sugar alcohols such as sorbitol and manitol instead of sugar. Sugar alcohols are not absorbed by the gastrointestinal tract. They tend to cause diarrhea and foods containing these "sweeteners" should be avoided. You may find these sugar alcohols in sugar-free mints and chewing gum, as well as in diabetic candies and cookies. Read the labels carefully.  Many people ask if they need to restrict milk and milk-containing products because milk contains lactose (the sugar found in milk) naturally. Many people lack the enzyme lactase, which is necessary to digest the lactose. If you cannot digest lactose, you will have gas, diarrhea, and bloating when you consume milk products. However, many people can tolerate small amounts of milk. For example, you may be able to tolerate milk on your cereal, even though you cannot drink a glass of milk.  Fiber Soluble fibers are very important to someone with SBS, particularly if the colon is connected. Any undigested soluble fibers that reach the colon are metabolized into short chain fatty acids (SCFA). These SCFA are then absorbed through the mucosa of the colon and used as a source of energy. These SCFA also help with sodium and water absorption.  Types of Short Bowel Syndrome treatment Several methods may be used to help you manage your Short Bowel Syndrome. The treating doctor will help  you find the right option(s) based on your individual needs. Learn more about: Nutrition  Dietary adjustments  Surgical procedures  Nutrition The primary treatment for (SBS) requires ensuring that the patient absorbs the proper amount of nutrition and fluid. People with SBS may receive their nutrition through oral, enteral and/or parenteral delivery. Many people will use a combination of these methods over time. Enteral nutrition This form of nutrition is delivered through a feeding tube that is inserted directly into the stomach or small bowel. Staying on enteral nutrition or continuing to eat orally stimulates the digestive process, which can help maintain or improve the absorptive power of the remaining bowel. A partially functioning GI tract is necessary for enteral nutrition to be effective. Parenteral nutrition Parenteral nutrition (PN) is liquid food usually delivered through a catheter directly into the bloodstream, instead of through the stomach and small intestine. Depending on your diagnosis, you may only require intravenous (IV) fluids and electrolytes administered through a catheter. The catheter is a thin tube that is surgically inserted into the body, usually in a large vein in the chest or arm, for the purpose of delivering PN and fluids. PN contains nutrients such as proteins, carbohydrates, fats, vitamins or minerals. PN is often customized to deliver specific nutritional needs. Most people are familiar with PN delivered in the hospital setting, but many people with SBS receive their PN at home. Because PN can deliver many of the nutrients needed to sustain life in people with SBS, it has been  the standard treatment for decades.  Adjustments to diet There is no single, established diet for SBS. Each diet is customized, depending on the location and length of the remaining bowel, and how it is functioning. Most doctors will encourage people with Short Bowel Syndrome to continue to  eat if possible, since eating can stimulate absorption. People with SBS who are maintained on an oral diet may need to consume much larger amounts of food than a person with a normal-sized bowel to compensate for their inability to absorb sufficient amounts of nutrients and fluids. Talk to your doctor or a registered dietician about how to improve nutrition and overall health with your diet. Generally, for people with Short Bowel Syndrome, meals should be: High in protein  High in refined or low-fiber complex carbohydrates  Moderate in fat (if a large section of the ileum has been removed, fats may be better tolerated earlier in the day)  Low in concentrated sweets  Additional tips: Include beverages in diet  Follow a low-lactose diet if lactose intolerant  Avoid alcohol, coffee, tea and cola drinks   Supplements Multivitamins with minerals should be taken orally two or three times a day if you are not receiving PN. Monthly vitamin B12 injections are recommended if you have had the terminal ileum resected. Additional oral supplementation of several essential minerals such as calcium, magnesium, and zinc is often necessary to replace excessive losses in patients with SBS. Often, calcium is taken in divided doses two to four times per day, as well as magnesium supplements in the form of lactate or gluconate salts, taken one hour before meals on an empty stomach. Zinc acetate is the most readily absorbed zinc salt. Is It Okay to Modify the Diet? Sometimes you just have to have that favorite food even though you know it is going to run right through you. Is this okay? Well, on occasion, it is acceptable to sample that favorite food. But it is best to just "sample" it, and to do it when you are close to a bathroom. If you do this too often, you are likely to lose too much fluid and too many electrolytes.  Conclusion We eat for nutrition as well as for pleasure. If you have SBS, the type of food you eat  and the way you consume food will affect your absorption. Small adjustments in the diet can give you more control over your output without you having to sacrifice nutrition or taste.  Dr. Georga Bora is the Director of Nutrition, [facility], [place], [place]. This article is based on a presentation Dr. Georga Bora gave at the [facility] in [place] in June.

## 2013-10-22 NOTE — Progress Notes (Signed)
Subjective:     Patient ID: Kurt Cross, male   DOB: 07/28/1951, 63 y.o.   MRN: [id number]  HPI  Note: This dictation was prepared with Dragon/digital dictation along with [facility] technology. Any transcriptional errors that result from this process are unintentional.       Kurt Cross  1950/12/09 [id number]  Patient Care Team: [name], MD as PCP - General (Internal Medicine) [name], MD as Consulting Physician (Cardiology) [name], MD as Consulting Physician (Pulmonary Disease)  Procedure (Date: 08/23/2013):  POST-OPERATIVE DIAGNOSIS: GIANT abdominal abscess with infected old GoreTex mesh   PROCEDURE: Procedure(s):  INCISION AND DRAINAGE ABSCESS  GoreTex mesh removal  Debridement of abdominal wall & peritoneum  SURGEON: Surgeon(s):  [name], MD  ASSISTANT: Nedra Hai, MD   This patient returns for surgical re-evaluation.  Pleasant morbidly obese diabetic smoking male.  Have giant abscess around the mesh 3 recurrent hernia.  Develop delayed small bowel fistula.  Prolonged hospital stay for several months.  Record IV nutrition.  Proximal jejunal fistula with inadequate oral intake to wean off of TNA.  Giant wound has gradually closed down.  The refused nursing facility care and home.  He struggles with back pain and soreness.  We were working to try and get a hospital bed for him.  The wound has contracted down.  There having issues trying to get the right ostomy supplies and Home Health support.  There used to having an ostomy as he had a colostomy in the distant past.  No major leaking with the bag.  No fevers or chills.  Has noted more watery output but has not been taking his Imodium or iron.  Glad to be home, emptying at once or twice a day.  Approximately 200-300 mL output per day.  Stronger but still feels quite worn-out.  He comes today in a wheelchair as it is hard for him to stand up and walk around easily.  Patient Active Problem List   Diagnosis Date Noted  . Infected prosthetic mesh of abdominal wall with GIANT abscess s/p removal 08/14/2013 08/14/2013    Priority: High  . Obesity, Class III, BMI 40-49.9 (morbid obesity) 09/01/2007    Priority: Medium  . Atrial fibrillation/flutter 10/06/2013  . 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 Acute renal failure (ARF) 08/15/2013  . Recurrent ventral incisional hernia 08/14/2013  . AAA (abdominal aortic aneurysm)/ 4.5 cm ascending per CT angio 08/01/13 08/12/2013  . Right leg pain 08/11/2013  . Chronic combined systolic and diastolic CHF (congestive heart failure) 08/04/2013  . Constipation, chronic 08/03/2013  . Acute-on-chronic respiratory failure secondary to probable community-acquired pneumonia 08/24/2012  . Hypertension 06/28/2012  . Diabetes mellitus, insulin dependent (IDDM), uncontrolled 09/01/2007  . HYPERLIPIDEMIA 09/01/2007  . OBSTRUCTIVE SLEEP APNEA 09/01/2007    Past Medical History  Diagnosis Date  . Sleep apnea   . Diabetes mellitus   . Hypertension   . Hyperlipidemia   . CHF (congestive heart failure)   . Hernia   . AAA (abdominal aortic aneurysm)/ 4.5 cm ascending per CT angio 08/01/13 08/12/2013  . Infected prosthetic mesh of abdominal wall with GIANT abscess s/p removal 08/14/2013 08/14/2013    Past Surgical History  Procedure Laterality Date  . Appendectomy    . Tonsillectomy    . Nasal septum surgery    . Bowel resection  1980s?    ?colectomy/ostomy - Dr Elesa Hacker  . Ostomy take down  1980s?    Dr Elesa Hacker  .  Incisional hernia repair  1980s?    Dr. Lindon Romp - w mesh  . Knee surgery Right   . Incision and drainage abscess N/A 08/14/2013    Procedure: INCISION AND DRAINAGE ABSCESS, mesh ;  Surgeon: [name], MD;  Location: [facility];  Service: General;  Laterality: N/A;    History   Social History  . Marital Status: Married    Spouse Name: N/A    Number of Children: N/A  . Years of Education: N/A   Occupational History  . Not on  file.   Social History Main Topics  . Smoking status: Former Smoker -- 1.00 packs/day for 40 years    Quit date: 08/21/2012  . Smokeless tobacco: Never Used  . Alcohol Use: No  . Drug Use: No  . Sexual Activity: Not on file   Other Topics Concern  . Not on file   Social History Narrative  . No narrative on file    History reviewed. No pertinent family history.  Current Outpatient Prescriptions  Medication Sig Dispense Refill  . ACCU-CHEK FASTCLIX LANCETS MISC       . ACCU-CHEK SMARTVIEW test strip       . acetaminophen (TYLENOL) 500 MG tablet Take 2 tablets (1,000 mg total) by mouth 3 (three) times daily.  30 tablet  0  . alum & mag hydroxide-simeth (MAALOX/MYLANTA) 200-200-20 MG/5ML suspension Take 30 mLs by mouth every 6 (six) hours as needed for indigestion or heartburn (dyspepsia).  355 mL  0  . amLODipine (NORVASC) 10 MG tablet Take 1 tablet (10 mg total) by mouth daily.  30 tablet  0  . antiseptic oral rinse (BIOTENE) LIQD 15 mLs by Mouth Rinse route 2 times daily at 12 noon and 4 pm.  473 mL  0  . apixaban (ELIQUIS) 5 MG TABS tablet Take 1 tablet (5 mg total) by mouth 2 (two) times daily.  60 tablet  1  . Blood Glucose Monitoring Suppl (ACCU-CHEK NANO SMARTVIEW) W/DEVICE KIT       . carvedilol (COREG) 3.125 MG tablet Take 1 tablet (3.125 mg total) by mouth 2 (two) times daily with a meal.  60 tablet  3  . ferrous sulfate 325 (65 FE) MG tablet Take 1 tablet (325 mg total) by mouth 2 (two) times daily.  60 tablet  3  . furosemide (LASIX) 40 MG tablet Take 1 tablet (40 mg total) by mouth daily.  30 tablet  3  . HUMULIN R 100 UNIT/ML injection       . hydrALAZINE (APRESOLINE) 50 MG tablet Take 1 tablet (50 mg total) by mouth every 8 (eight) hours.  90 tablet  0  . insulin aspart (NOVOLOG) 100 UNIT/ML injection Inject 0-9 Units into the skin 3 (three) times daily with meals.  1 vial  12  . insulin glargine (LANTUS) 100 UNIT/ML injection Inject 0.35 mLs (35 Units total) into the  skin at bedtime.  10 mL  12  . isosorbide mononitrate (IMDUR) 30 MG 24 hr tablet Take 1 tablet (30 mg total) by mouth daily.  30 tablet  0  . lisinopril (PRINIVIL,ZESTRIL) 10 MG tablet       . loperamide (IMODIUM) 2 MG capsule Take 1 capsule (2 mg total) by mouth 2 (two) times daily.  30 capsule  0  . loperamide (IMODIUM) 2 MG capsule Take 1-2 capsules (2-4 mg total) by mouth every 8 (eight) hours as needed for diarrhea or loose stools (Use if >2 BM every 8 hours).  30 capsule  0  . octreotide (SANDOSTATIN) 100 MCG/ML SOLN injection Inject 1.5 mLs (150 mcg total) into the skin 3 (three) times daily.  90 mL  0  . OxyCODONE (OXYCONTIN) 20 mg T12A 12 hr tablet Take 1 tablet (20 mg total) by mouth every 12 (twelve) hours.  60 tablet  0  . oxyCODONE (ROXICODONE) 15 MG immediate release tablet Take 1 tablet (15 mg total) by mouth every 4 (four) hours as needed.  65 tablet  0  . polyvinyl alcohol (LIQUIFILM TEARS) 1.4 % ophthalmic solution Place 2 drops into both eyes as needed for dry eyes.  15 mL  0  . potassium chloride SA (K-DUR,KLOR-CON) 20 MEQ tablet Take 2 tablets (40 mEq total) by mouth daily.  30 tablet  0  . rosuvastatin (CRESTOR) 20 MG tablet Take 20 mg by mouth every other day.      . saccharomyces boulardii (FLORASTOR) 250 MG capsule Take 1 capsule (250 mg total) by mouth 2 (two) times daily.  60 capsule  0  . spironolactone (ALDACTONE) 100 MG tablet       . zolpidem (AMBIEN) 10 MG tablet Take 1 tablet (10 mg total) by mouth at bedtime.  30 tablet  0   No current facility-administered medications for this visit.     No Known Allergies  BP 160/88  Pulse 72  Temp(Src) 98.4 F (36.9 C) (Temporal)  Resp 15  Dg Chest 1 View  10/02/2013   CLINICAL DATA:  Bedside PICC placement.  EXAM: PORTABLE CHEST - 1 VIEW  COMPARISON:  Two-view chest x-[facility]rier 09/28/2013.  FINDINGS: Right arm PICC tip projects over the lower SVC at or near the cavoatrial junction. Cardiac silhouette enlarged but stable.  Mild pulmonary venous hypertension without overt edema. Lungs clear. No visible pleural effusions. Left subclavian biventricular transvenous pacemaker unchanged.  IMPRESSION: 1. Right arm PICC tip projects over the lower SVC at or near the cavoatrial junction. 2. Stable cardiomegaly. Pulmonary venous hypertension without overt edema. No acute cardiopulmonary disease.   Electronically Signed   By: Evangeline Dakin M.D.   On: 10/02/2013 16:54   Dg Chest 2 View  09/28/2013   CLINICAL DATA:  Fever.  Weakness.  EXAM: CHEST  2 VIEW  COMPARISON:  08/18/2013  FINDINGS: Cardiac silhouette is mildly enlarged. Mediastinum is normal in contour. The lungs are clear. Right PICC tip is in the upper superior vena cava, and appears mildly retracted compared to the prior study. Left anterior chest wall sequential pacemaker is stable in well positioned. Bony thorax is intact.  IMPRESSION: No active cardiopulmonary disease.   Electronically Signed   By: Lajean Manes M.D.   On: 09/28/2013 13:36   Dg Chest Port 1 View  10/15/2013   CLINICAL DATA:  Line placement.  EXAM: PORTABLE CHEST - 1 VIEW  COMPARISON:  10/02/2013  FINDINGS: Right-sided PICC line is again seen coursing into the region of the SVC as tip is difficult to visualize although appears to be over the SVC. Left-sided pacemaker is unchanged. There is minimal prominence of the perihilar markings suggesting mild vascular congestion. There is no definite effusion. There is stable cardiomegaly. Remainder of the exam is unchanged.  IMPRESSION: Findings suggesting mild vascular congestion.  Stable cardiomegaly.  Right-sided PICC line with tip difficult to visualize although appears to be over the SVC.   Electronically Signed   By: Marin Olp M.D.   On: 10/15/2013 15:26     Review of Systems  Constitutional: Negative for fever,  chills and diaphoresis.  HENT: Negative for sore throat and trouble swallowing.   Eyes: Negative for photophobia and visual disturbance.    Respiratory: Negative for choking and shortness of breath.   Cardiovascular: Negative for chest pain and palpitations.  Gastrointestinal: Positive for abdominal pain, diarrhea and abdominal distention. Negative for nausea, vomiting, anal bleeding and rectal pain.  Genitourinary: Negative for dysuria, urgency, difficulty urinating and testicular pain.  Musculoskeletal: Negative for arthralgias, gait problem, myalgias and neck pain.  Skin: Positive for wound. Negative for color change and rash.  Neurological: Negative for dizziness, speech difficulty, weakness and numbness.  Hematological: Negative for adenopathy.  Psychiatric/Behavioral: Negative for hallucinations, confusion and agitation.       Objective:   Physical Exam  Constitutional: He is oriented to person, place, and time. He appears well-developed and well-nourished. No distress.  HENT:  Head: Normocephalic.  Mouth/Throat: Oropharynx is clear and moist. No oropharyngeal exudate.  Eyes: Conjunctivae and EOM are normal. Pupils are equal, round, and reactive to light. No scleral icterus.  Neck: Normal range of motion. Neck supple. No tracheal deviation present.  Cardiovascular: Normal rate, regular rhythm and intact distal pulses.   Pulmonary/Chest: Effort normal and breath sounds normal. No respiratory distress.  Abdominal: Soft. He exhibits no distension. There is no tenderness. A hernia is present. Hernia confirmed positive in the ventral area. Hernia confirmed negative in the right inguinal area and confirmed negative in the left inguinal area.    Musculoskeletal: Normal range of motion. He exhibits no tenderness.  Lymphadenopathy:    He has no cervical adenopathy.       Right: No inguinal adenopathy present.       Left: No inguinal adenopathy present.  Neurological: He is alert and oriented to person, place, and time. No cranial nerve deficit. He exhibits normal muscle tone. Coordination normal.  Skin: Skin is warm and  dry. No rash noted. He is not diaphoretic. No erythema. No pallor.  Psychiatric: He has a normal mood and affect. His behavior is normal. Judgment and thought content normal.       Assessment:     Infected prosthetic mesh of giant abscess status post removal and delayed small bowel fistula creation into hernia wound.  Gradually closing and.  Severe deconditioning and malnutrition.     Plan:     Continue aggressive wound care.  There used to prior ostomy although this is more complicated.  Adjusting size as the wound contracts.  Set up outpatient follow up [name] with C[facility] Benton next week.  Work on getting better team through advanced home care for home colostomy care.  Continue IV nutrition.  Defer to Dr. Maudie Mercury to help manage the specifics.There do to see him sooner  Although with cardiology with Dr. Terrence Dupont.  Appointment already set up.  Try oral intake with short gut syndrome type of diet.  Restart iron and Imodium to control output.  His mandible, may be able to adjust her weaned TPN.  Unlikely but still within the realm of possibility.  Followup every 2-3 weeks until wound better/more stable.  Hospital bed has been ordered for home DVT deconditioning and morbid obesity.  He continues to not smoke.  I encouraged him to stay motivated for that.  Work towards a more aggressive physical activity as tolerated.  He is rather deconditioned but he is trying to do more at home.  Evaluate with hernia guru at a [facility].  [facility] most likely possibility at this time.

## 2013-10-22 NOTE — Telephone Encounter (Signed)
Called and left message for pt to call.  He has an appointment today at 11:45 and he is not here yet.  I want to see if he is coming.

## 2013-10-22 NOTE — Telephone Encounter (Signed)
Called [facility] to check on the paperwork that was supposed to be faxed to [place] from [facility] about the pt getting a hopsital bed but we have not received anything. [facility] advised me to fax in an order for hospital bed on a rx fx to [id number]. [facility] advised if any more questions then they will fax me more orders. I faxed the request to [facility].

## 2013-10-27 ENCOUNTER — Telehealth (INDEPENDENT_AMBULATORY_CARE_PROVIDER_SITE_OTHER): Payer: Self-pay | Admitting: General Surgery

## 2013-10-27 NOTE — Telephone Encounter (Signed)
Pt's wife called, very frustrated with trying to locate/ purchase ostomy supplies for Kurt Cross.  She has finally been in touch with [facility], [phone]) Acct# [id number].  They need VO from Dr. Johney Maine to proceed.  Called and spoke with [employer] representative and verified all necessary information, then to another to give the verbal order.  Kurt Cross is still working on validating the [facility] coverage, but will expedite the order.  Called pt's wife back and gave her the status; she was extremely appreciative of our help and effort on their behalf.

## 2013-10-27 NOTE — Telephone Encounter (Signed)
Pt's wife called regarding ostomy supplies.  She cannot remember the name or phone number of supplier in [place] given to her by Dr. Johney Maine.  Gave her the phone number to [facility] and to ask for [employer].  She will call now.

## 2013-10-29 ENCOUNTER — Telehealth (INDEPENDENT_AMBULATORY_CARE_PROVIDER_SITE_OTHER): Payer: Self-pay | Admitting: General Surgery

## 2013-10-29 NOTE — Telephone Encounter (Signed)
Pt's wife called in extreme frustration.  States the medical supplies are not fully covered by insurance and they don't have nearly $300 to pay for them.  I allowed her to ventilate her frustrations, then suggested she call her insurance company and talk with a nurse or personal representative to discuss her options.  She states she is tempted to call an ambulance to take him to the ER for them to help out.  I attempted to deter her from doing this, as it would be not be a productive way to solve her problem.  Wife states she is waiting for a call-back from the ostomy nurse, too.  She will try to call the insurance company again.

## 2013-11-01 ENCOUNTER — Telehealth (INDEPENDENT_AMBULATORY_CARE_PROVIDER_SITE_OTHER): Payer: Self-pay | Admitting: *Deleted

## 2013-11-01 NOTE — Telephone Encounter (Signed)
I spoke with [name] at [facility] regarding referral that was sent over.  She states that have received all of the notes requested and they will be reviewing those and it may be a couple of weeks before they are able to contact the pt for an appt.

## 2013-11-05 ENCOUNTER — Encounter (INDEPENDENT_AMBULATORY_CARE_PROVIDER_SITE_OTHER): Payer: Self-pay | Admitting: Surgery

## 2013-11-05 ENCOUNTER — Ambulatory Visit (INDEPENDENT_AMBULATORY_CARE_PROVIDER_SITE_OTHER): Payer: Medicare Other | Admitting: Surgery

## 2013-11-05 VITALS — BP 126/81 | HR 76 | Temp 98.6°F | Resp 18

## 2013-11-05 DIAGNOSIS — T8579XA Infection and inflammatory reaction due to other internal prosthetic devices, implants and grafts, initial encounter: Secondary | ICD-10-CM

## 2013-11-05 DIAGNOSIS — K432 Incisional hernia without obstruction or gangrene: Secondary | ICD-10-CM

## 2013-11-05 DIAGNOSIS — K632 Fistula of intestine: Secondary | ICD-10-CM

## 2013-11-05 DIAGNOSIS — K912 Postsurgical malabsorption, not elsewhere classified: Secondary | ICD-10-CM

## 2013-11-05 NOTE — Patient Instructions (Signed)
Please consider the recommendations that we have given you today:  See Kurt Cross nurse at [facility] supply tomorrow.  See Kurt Cross To begin planning surgical repair of fistula and large recurrent Kurt:  11/17/2013 10AM   Dr Perley Jain [facility]. [address], [place], [place] [phone]  Please call our office at [phone] if you wish to schedule surgery or if you have further questions / concerns.    Ostomy Support Information  Yes, you've heard that people get along just fine with only one of their eyes, or one of their lungs, or one of their kidneys. But you also know that you have only one intestine and only one bladder, and that leaves you feeling awfully empty, both physically and emotionally: You think no other people go around without part of their intestine with the ends of their intestines sticking out through their abdominal walls.  Well, you are wrong! There are nearly three quarters of a million people in the [place] who have an ostomy; people who have had surgery to remove all or part of their colons or bladders. There is even a national association, the [place] [employer] [place] with over 350 local affiliated support groups that are organized by volunteers who provide peer support and counseling. Kurt Cross has a toll free telephone num-ber, [phone] and an educational,  interactive website, www.ostomy.org   An ostomy is an opening in the belly (abdominal wall) made by surgery. Ostomates are people who have had this procedure. The opening (stoma) allows the kidney or bowel to discharge waste. An external pouch covers the stoma to collect waste. Pouches are are a simple bag and are odor free. Different companies have disposable or reusable pouches to fit one's lifestyle. An ostomy can either be temporary or permanent.  THERE ARE THREE MAIN TYPES OF  OSTOMIES  Colostomy. A colostomy is a surgically created opening in the large intestine (colon).  Ileostomy. An ileostomy is a surgically created opening in the small intestine.  Urostomy. A urostomy is a surgically created opening to divert urine away from the bladder. FREQUENTLY ASKED QUESTIONS   Why haven't you met any of these folks who have an ostomy?  Well, maybe you have! You just did not recognize them because an ostomy doesn't show. It can be kept secret if you wish. Why, maybe some of your best friends, office associates or neighbors have an ostomy ... you never can tell.   People facing ostomy surgery have many quality-of-life questions like:  Will you bulge? Smell? Make noises? Will you feel waste leaving your body? Will you be a captive of the toilet? Will you starve? Be a social outcast? Get/stay married? Have babies? Easily bathe, go swimming, bend over?  OK, let's look at what you can expect:  Will you bulge?  Remember, without part of the intestine or bladder, and its contents, you should have a flatter tummy than before. You can expect to wear, with little exception, what you wore before surgery ... and this in-cludes tight clothing and bathing suits.  Will you smell?  Today, thanks to modern odor proof pouching systems, you can walk into an ostomy support group meeting and not smell anything that is foul or offensive. And, for those with an ileostomy or colostomy who are concerned about odor when emptying their pouch, there are in-pouch deodorants that can be used to eliminate any waste odors that may exist.  Will you make noises?  Everyone produces gas, especially if they are an air-swallower. But intestinal sounds that occur from time to time are no differ-ent than a gurgling tummy, and quite often your clothing will muffle any sounds.   Will you feel the waste discharges?  For those with a colostomy or ileostomy there might be a slight pressure when waste leaves your  body, but understand that the intestines have no nerve endings, so there will be no unpleasant sensations. Those with a urostomy will probably be unaware of any kidney drainage.  Will you be a captive of the toilet?  Immediately post-op you will spend more time in the bathroom than you will after your body recovers from surgery. Every person is different, but on average those with an ileostomy or urostomy may empty their pouches 4 to 6 times a day; a little  less if you have a colostomy. The average wear time between pouch system changes is 3 to 5 days and the changing process should take less than 30 minutes.  Will I need to be on a special diet? Most people return to their normal diet when they have recovered from surgery. Be sure to chew your food well, eat a well-balanced diet and drink plenty of fluids. If you experience problems with a certain food, wait a couple of weeks and try it again. Will there be odor and noises? Pouching systems are designed to be odor-proof or odor-resistant. There are deodorants that can be used in the pouch. Medications are also available to help reduce odor. Limit gas-producing foods and carbonated beverages. You will experience less gas and fewer noises as you heal from surgery. How much time will it take to care for my ostomy? At first, you may spend a lot of time learning about your ostomy and how to take care of it. As you become more comfortable and skilled at changing the pouching system, it will take very little time to care for it.  Will I be able to return to work? People with ostomies can perform most jobs. As soon as you have healed from surgery, you should be able to return to work. Heavy lifting (more than 10 pounds) may be discouraged.  What about intimacy? Sexual relationships and intimacy are important and fulfilling aspects of your life. They should continue after ostomy surgery. Intimacy-related concerns should be discussed openly between you and your  partner.  Can I wear regular clothing? You do not need to wear special clothing. Ostomy pouches are fairly flat and barely noticeable. Elastic undergarments will not hurt the stoma or prevent the ostomy from functioning.  Can I participate in sports? An ostomy should not limit your involvement in sports. Many people with ostomies are runners, skiers, swimmers or participate in other active lifestyles. Talk with your caregiver first before doing heavy physical activity.  Will you starve?  Not if you follow doctor's orders at each stage of your post-op adjustment. There is no such thing as an "ostomy diet". Some people with an ostomy will be able to eat and tolerate anything; others may find diffi-culty with some foods. Each person is an individual and must determine, by trial, what is best for them. A good practice for all is to drink plenty of water.  Will you be a social outcast?  Have you met anyone who has an ostomy and is a social outcast? Why should you be the first? Only your attitude and self image will effect how you are  treated. No confi-dent person is an Occupational psychologist.   PROFESSIONAL HELP  Resources are available if you need help or have questions about your ostomy.    Specially trained nurses called Wound, Ostomy Continence Nurses (WOCN) are available for consultation in most major medical centers.   Ostomy consult with Kurt Cross at [place] to help troubleshoot pouch fittings and other issues with your wound/fistula: [phone]   The [place] Ostomy Association (UOA) is a group made up of many local chapters throughout the [place]. These local groups hold meetings and provide support to prospective and existing ostomates. They sponsor educational events and have qualified visitors to make personal or telephone visits. Contact the UOA for the chapter nearest you and for other educational publications.  More detailed information can be found in  Colostomy Guide, a publication of the [employer] (UOA). Contact UOA at 1-(814)349-1283 or visit their web site at https://arellano.com/. The website contains links to other sites, suppliers and resources. Document Released: 09/26/2003 Document Revised: 12/16/2011 Document Reviewed: 01/25/2009 [facility] Patient Information 2013 [place].  Exercise to Stay Healthy Exercise helps you become and stay healthy. EXERCISE IDEAS AND TIPS Choose exercises that:  You enjoy.  Fit into your day. You do not need to exercise really hard to be healthy. You can do exercises at a slow or medium level and stay healthy. You can:  Stretch before and after working out.  Try yoga, Pilates, or tai chi.  Lift weights.  Walk fast, swim, jog, run, climb stairs, bicycle, dance, or rollerskate.  Take aerobic classes. Exercises that burn about 150 calories:  Running 1  miles in 15 minutes.  Playing volleyball for 45 to 60 minutes.  Washing and waxing a car for 45 to 60 minutes.  Playing touch football for 45 minutes.  Walking 1  miles in 35 minutes.  Pushing a stroller 1  miles in 30 minutes.  Playing basketball for 30 minutes.  Raking leaves for 30 minutes.  Bicycling 5 miles in 30 minutes.  Walking 2 miles in 30 minutes.  Dancing for 30 minutes.  Shoveling snow for 15 minutes.  Swimming laps for 20 minutes.  Walking up stairs for 15 minutes.  Bicycling 4 miles in 15 minutes.  Gardening for 30 to 45 minutes.  Jumping rope for 15 minutes.  Washing windows or floors for 45 to 60 minutes. Document Released: 10/26/2010 Document Revised: 12/16/2011 Document Reviewed: 10/26/2010 [facility] Patient Information 2014 [place], [place].  Ileostomy Home Guide An ileostomy is an opening for stool to leave your body when a medical condition prevents it from leaving through the usual opening (rectum). During a surgery, a piece of small intestine (ileum) is brought through a hole in the  abdominal wall. The new opening is called a stoma or ostomy. A bag or pouch fits over the stoma to catch stool and gas. Your stool may be liquid at first. Over time, it may become about the consistency of applesauce. CARING FOR YOUR STOMA Normally, the stoma looks a lot like the inside of your cheek: pink and moist. At first, it may be swollen, but this swelling will decrease within 6 weeks. Keep the skin around the stoma clean and dry. You can gently wash your stoma in the shower with a clean, soft washcloth. If you develop any skin irritation, your caregiver may give you a stoma powder or ointment to help heal the area. Do not use any products other than those specifically given to you  by your caregiver.  Your stoma should not be uncomfortable. If you notice any stinging or burning, your pouch may be leaking, and the skin around your stoma may be coming into contact with stool. This can cause skin irritation. If you notice stinging, you should replace your pouch with a new one and discard the old one. OSTOMY POUCHES The pouch that fits over the ostomy can be made up of either 1 or 2 pieces. A one-piece pouch has a skin barrier piece and the pouch itself in one unit. A two-piece pouch has a skin barrier with a separate pouch that snaps on and off of the skin barrier. Either way, you should empty the pouch when it is only  to  full. Do not let more stool or gas build up. This could cause the pouch to leak. Some ostomy bags have a built-in gas release valve. Ostomy deodorizer (5 drops) can be put into the pouch to prevent odor. Some people use an ostomy lubricant to help the stool slide out of the bag more easily and completely.  EMPTYING YOUR OSTOMY POUCH You may get lessons on how to empty your pouch from a wound-ostomy nurse before you leave the hospital. Here are the basic steps:  Wash your hands with soap and water.  Sit far back on the toilet.  Put pieces of toilet paper into the toilet water.  This will prevent splashing as you empty the stool into the toilet bowl.  Unclip or unvelcro the tail end of the pouch.  Unroll the tail and empty stool into the toilet.  Clean the tail with toilet paper.  Reroll the tail, and clip or velcro it closed.  Wash your hands again. CHANGING YOUR OSTOMY POUCH Change your ostomy pouch about every 3 to 4 days for the first 6 weeks, then every 5 to 7 days. Always change the bag sooner if you begin to notice any discomfort or irritation of the skin around the stoma. When possible, plan to change your ostomy pouching system before eating and drinking because this will lessen the chance of stool coming out during the change. A wound-ostomy nurse may teach you how to change your pouch before you leave the hospital. Here are the basic steps:  Lay out your supplies.  Wash your hands with soap and water.  Carefully remove the old pouch.  Wash the stoma and the skin around the stoma. Allow the area to dry. Men may be advised to shave any hair around the stoma very carefully. This will make the adhesive stick better.  Use the stoma measuring guide that comes with your pouch set to decide what size hole you will need to cut in the skin barrier piece. Choose the smallest possible size that will hold the stoma but will not touch it.  Use the guide to trace the circle on the back of the skin barrier piece. Cut out the hole.  Hold the skin barrier piece over the stoma to make sure the hole is the correct size.  Remove the adhesive paper backing from the skin barrier piece.  Squeeze stoma paste around the opening of the skin barrier piece.  Clean and dry the skin around the stoma again.  Carefully fit the skin barrier piece over your stoma.  If you are using a two-piece pouch, snap the pouch onto the skin barrier piece.  Close the tail of the pouch.  Put your hand over the top of the skin barrier piece to help  warm it for about 5 minutes, so that it  conforms to your body better.  Wash your hands again. DIET TIPS Because you have a higher risk of a blockage in the first 2 months after getting an ileostomy, you should decrease your fiber intake for that time period. Fibrous foods include:  Celery.  Cabbage (and coleslaw).  Pineapple.  Mushrooms.  Corn and popcorn.  Whole fruits and vegetables, especially with the skins on.  Foods that contain seeds.  Oranges, grapefruit, tangerines, and other citrus fruit.  Nuts. After about 2 months, you can slowly add these kinds of foods back into your regular diet, as tolerated. Other tips:  Drink about eight 8 oz glasses of water each day.  You can prevent gas by eating slowly and chewing your food thoroughly.  If you feel concerned that you have too much gas, you can cut back on gas-producing foods, such as:  Spicy foods.  Onions and garlic.  Cruciferous vegetables (cabbage, broccoli, cauliflower, Brussels sprouts).  Beans and legumes.  Some cheeses.  Eggs.  Fish.  Bubbly (carbonated) drinks.  Chewing gum. GENERAL TIPS  You can shower with or without the bag in place.  Always keep the bag snapped on if you are bathing or swimming.  If your bag gets wet, you can dry it with a blow-dryer set to cool.  Avoid wearing tight clothing directly over your stoma so that it does not become irritated or bleed. Tight clothing can also prevent the stool from draining into the pouch, which can cause it to leak.  It is helpful to always have an extra skin barrier and pouch with you when traveling. Do not leave them anywhere too warm, because parts of them can melt.  Do not let your seat belt rest on your stoma. Try to keep the seat belt either above or below your stoma, or use a tiny pillow to cushion it.  You can still participate in sports, but you should avoid activities in which there is a risk of getting hit in the abdomen.  You can still have sex. It is a good idea to  empty your pouch prior to sex. Some people and their partners feel very comfortable seeing the pouch during sex. Others choose to wear lingerie or a T-shirt that covers the device. SEEK IMMEDIATE MEDICAL CARE IF:  You feel dizzy, lightheaded, or faint.  You measure pouch drainage of more than 1,500 ml per day. This amount of drainage can lead to dehydration.  You notice a change in the size or color of the stoma, especially if it becomes very red, purple, black, or pale white.  You have bloody stools or bleeding from the ostomy.  You have abdominal pain, nausea, vomiting, or bloating.  There is anything unusual protruding from the ostomy.  You have irritation or red skin around the ostomy.  No stool is passing from the stoma.  You have diarrhea (requiring pouch emptying more frequently than normal). Document Released: 09/26/2003 Document Revised: 01/18/2013 Document Reviewed: 02/20/2011 [facility] Patient Information 2014 [place], [place].

## 2013-11-05 NOTE — Progress Notes (Signed)
Subjective:     Patient ID: Kurt Cross, male   DOB: 10-27-1950, 63 y.o.   MRN: [id number]  HPI   Note: This dictation was prepared with Dragon/digital dictation along with [facility] technology. Any transcriptional errors that result from this process are unintentional.       Kurt Cross  08-Sep-1951 [id number]  Patient Care Team: [name], MD as PCP - General (Internal Medicine) [name], MD as Consulting Physician (Cardiology) [name], MD as Consulting Physician (Pulmonary Disease)  Procedure (Date: 08/23/2013):  POST-OPERATIVE DIAGNOSIS: GIANT abdominal abscess with infected old GoreTex mesh   PROCEDURE: Procedure(s):  INCISION AND DRAINAGE ABSCESS  GoreTex mesh removal  Debridement of abdominal wall & peritoneum  SURGEON: Surgeon(s):  [name], MD  ASSISTANT: Nedra Hai, MD   This patient returns for surgical re-evaluation.  Pleasant morbidly obese diabetic smoking male.  Have giant abscess around the mesh 3 recurrent hernia.  Develop delayed small bowel fistula.  Prolonged hospital stay for several months.  Record IV nutrition.  Proximal jejunal fistula with inadequate oral intake to wean off of TNA.  Giant wound has gradually closed down.  The refused nursing facility care and home.  He struggles with back pain and soreness.  We were working to try and get a hospital bed for him.  The wound has contracted down.  There having issues trying to get the right ostomy supplies and Home Health support.  He needs a giant ostomy type wound bag.  Home health keep sending him small colostomy bags.   We set up an appointment to see outpatient wound ostomy nurse and [facility] medical supply.  They were not able to contact her and make that happen.  They are used to having an ostomy as he had a colostomy in the distant past.  No major leaking with the bag.  No fevers or chills.  Approximately 200-300 mL output per day in giant bag.  Feels quite worn-out.  He comes  today in a wheelchair as it is hard for him to stand up and walk around easily.  He is tolerating pured food.  Has early satiety.  Feels very tired.  Walking around the house but not going out to exercise.  Seems discouraged.  They have been able to switch the IV parenteral nutrition to cycled at night only.  Patient Active Problem List   Diagnosis Date Noted  . Short gut syndrome due to proximal jejunal SB fistula 10/22/2013    Priority: High  . Jejunal SB fistula into abdominal wound 08/23/2013    Priority: High  . Infected prosthetic mesh of abdominal wall with GIANT abscess s/p removal 08/14/2013 08/14/2013    Priority: Medium  . Recurrent ventral incisional hernia 08/14/2013    Priority: Medium  . Obesity, Class III, BMI 40-49.9 (morbid obesity) 09/01/2007    Priority: Medium  . Atrial fibrillation/flutter 10/06/2013  . AAA (abdominal aortic aneurysm)/ 4.5 cm ascending per CT angio 08/01/13 08/12/2013  . Right leg pain 08/11/2013  . Chronic combined systolic and diastolic CHF (congestive heart failure) 08/04/2013  . Acute-on-chronic respiratory failure secondary to probable community-acquired pneumonia 08/24/2012  . Hypertension 06/28/2012  . Diabetes mellitus, insulin dependent (IDDM), uncontrolled 09/01/2007  . HYPERLIPIDEMIA 09/01/2007  . OBSTRUCTIVE SLEEP APNEA 09/01/2007    Past Medical History  Diagnosis Date  . Sleep apnea   . Diabetes mellitus   . Hypertension   . Hyperlipidemia   . CHF (congestive heart failure)   . Hernia   .  AAA (abdominal aortic aneurysm)/ 4.5 cm ascending per CT angio 08/01/13 08/12/2013  . 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 Constipation, chronic 08/03/2013    Past Surgical History  Procedure Laterality Date  . Appendectomy    . Tonsillectomy    . Nasal septum surgery    . Bowel resection  1980s?    ?colectomy/ostomy - Dr Elesa Hacker  . Ostomy take down  1980s?    Dr Elesa Hacker    . Incisional hernia repair  1980s?    Dr. Lindon Romp - w mesh  . Knee surgery Right   . Incision and drainage abscess N/A 08/14/2013    Procedure: INCISION AND DRAINAGE ABSCESS, mesh ;  Surgeon: [name], MD;  Location: [facility];  Service: General;  Laterality: N/A;    History   Social History  . Marital Status: Married    Spouse Name: N/A    Number of Children: N/A  . Years of Education: N/A   Occupational History  . Not on file.   Social History Main Topics  . Smoking status: Former Smoker -- 1.00 packs/day for 40 years    Types: Cigarettes    Quit date: 08/21/2012  . Smokeless tobacco: Never Used  . Alcohol Use: No  . Drug Use: No  . Sexual Activity: Not on file   Other Topics Concern  . Not on file   Social History Narrative  . No narrative on file    History reviewed. No pertinent family history.  Current Outpatient Prescriptions  Medication Sig Dispense Refill  . ACCU-CHEK FASTCLIX LANCETS MISC       . ACCU-CHEK SMARTVIEW test strip       . acetaminophen (TYLENOL) 500 MG tablet Take 2 tablets (1,000 mg total) by mouth 3 (three) times daily.  30 tablet  0  . alum & mag hydroxide-simeth (MAALOX/MYLANTA) 200-200-20 MG/5ML suspension Take 30 mLs by mouth every 6 (six) hours as needed for indigestion or heartburn (dyspepsia).  355 mL  0  . amLODipine (NORVASC) 10 MG tablet Take 1 tablet (10 mg total) by mouth daily.  30 tablet  0  . antiseptic oral rinse (BIOTENE) LIQD 15 mLs by Mouth Rinse route 2 times daily at 12 noon and 4 pm.  473 mL  0  . apixaban (ELIQUIS) 5 MG TABS tablet Take 1 tablet (5 mg total) by mouth 2 (two) times daily.  60 tablet  1  . Blood Glucose Monitoring Suppl (ACCU-CHEK NANO SMARTVIEW) W/DEVICE KIT       . carvedilol (COREG) 3.125 MG tablet Take 1 tablet (3.125 mg total) by mouth 2 (two) times daily with a meal.  60 tablet  3  . ferrous sulfate 325 (65 FE) MG tablet Take 1 tablet (325 mg total) by mouth 2 (two) times daily.  60 tablet  3  .  furosemide (LASIX) 40 MG tablet Take 1 tablet (40 mg total) by mouth daily.  30 tablet  3  . HUMULIN R 100 UNIT/ML injection       . hydrALAZINE (APRESOLINE) 50 MG tablet Take 1 tablet (50 mg total) by mouth every 8 (eight) hours.  90 tablet  0  . insulin aspart (NOVOLOG) 100 UNIT/ML injection Inject 0-9 Units into the skin 3 (three) times daily with meals.  1 vial  12  . insulin glargine (LANTUS) 100 UNIT/ML injection Inject 0.35 mLs (35 Units total) into the skin at bedtime.  10 mL  12  . isosorbide mononitrate (IMDUR) 30 MG 24 hr tablet Take 1 tablet (30 mg total) by mouth daily.  30 tablet  0  . lisinopril (PRINIVIL,ZESTRIL) 10 MG tablet       . loperamide (IMODIUM) 2 MG capsule Take 1 capsule (2 mg total) by mouth 2 (two) times daily.  30 capsule  0  . loperamide (IMODIUM) 2 MG capsule Take 1-2 capsules (2-4 mg total) by mouth every 8 (eight) hours as needed for diarrhea or loose stools (Use if >2 BM every 8 hours).  60 capsule  5  . octreotide (SANDOSTATIN) 100 MCG/ML SOLN injection Inject 1.5 mLs (150 mcg total) into the skin 3 (three) times daily.  90 mL  0  . OxyCODONE (OXYCONTIN) 20 mg T12A 12 hr tablet Take 1 tablet (20 mg total) by mouth every 12 (twelve) hours.  60 tablet  0  . oxyCODONE (ROXICODONE) 15 MG immediate release tablet Take 1 tablet (15 mg total) by mouth every 4 (four) hours as needed.  65 tablet  0  . oxyCODONE-acetaminophen (PERCOCET) 10-325 MG per tablet       . polyvinyl alcohol (LIQUIFILM TEARS) 1.4 % ophthalmic solution Place 2 drops into both eyes as needed for dry eyes.  15 mL  0  . potassium chloride SA (K-DUR,KLOR-CON) 20 MEQ tablet Take 2 tablets (40 mEq total) by mouth daily.  30 tablet  0  . rosuvastatin (CRESTOR) 20 MG tablet Take 20 mg by mouth every other day.      . saccharomyces boulardii (FLORASTOR) 250 MG capsule Take 1 capsule (250 mg total) by mouth 2 (two) times daily.  60 capsule  0  . spironolactone (ALDACTONE) 100 MG tablet       . zolpidem  (AMBIEN) 10 MG tablet Take 1 tablet (10 mg total) by mouth at bedtime.  30 tablet  0   No current facility-administered medications for this visit.     No Known Allergies  BP 126/81  Pulse 76  Temp(Src) 98.6 F (37 C) (Temporal)  Resp 18  Dg Chest 1 View  10/02/2013   CLINICAL DATA:  Bedside PICC placement.  EXAM: PORTABLE CHEST - 1 VIEW  COMPARISON:  Two-view chest x-Dack 09/28/2013.  FINDINGS: Right arm PICC tip projects over the lower SVC at or near the cavoatrial junction. Cardiac silhouette enlarged but stable. Mild pulmonary venous hypertension without overt edema. Lungs clear. No visible pleural effusions. Left subclavian biventricular transvenous pacemaker unchanged.  IMPRESSION: 1. Right arm PICC tip projects over the lower SVC at or near the cavoatrial junction. 2. Stable cardiomegaly. Pulmonary venous hypertension without overt edema. No acute cardiopulmonary disease.   Electronically Signed   By: Evangeline Dakin M.D.   On: 10/02/2013 16:54   Dg Chest 2 View  09/28/2013   CLINICAL DATA:  Fever.  Weakness.  EXAM: CHEST  2 VIEW  COMPARISON:  08/18/2013  FINDINGS: Cardiac silhouette is mildly enlarged. Mediastinum is normal in contour. The lungs are clear. Right PICC tip is in the upper superior vena cava, and appears mildly retracted compared to the prior study. Left anterior chest wall sequential pacemaker is stable in well positioned. Bony thorax is intact.  IMPRESSION: No active cardiopulmonary disease.   Electronically Signed   By: Lajean Manes M.D.   On: 09/28/2013 13:36   Dg Chest Port 1 View  10/15/2013   CLINICAL DATA:  Line placement.  EXAM: PORTABLE CHEST - 1 VIEW  COMPARISON:  10/02/2013  FINDINGS: Right-sided PICC line  is again seen coursing into the region of the SVC as tip is difficult to visualize although appears to be over the SVC. Left-sided pacemaker is unchanged. There is minimal prominence of the perihilar markings suggesting mild vascular congestion. There is no  definite effusion. There is stable cardiomegaly. Remainder of the exam is unchanged.  IMPRESSION: Findings suggesting mild vascular congestion.  Stable cardiomegaly.  Right-sided PICC line with tip difficult to visualize although appears to be over the SVC.   Electronically Signed   By: Elberta Fortis M.D.   On: 10/15/2013 15:26     Review of Systems  Constitutional: Negative for fever, chills and diaphoresis.  HENT: Negative for sore throat and trouble swallowing.   Eyes: Negative for photophobia and visual disturbance.  Respiratory: Negative for choking and shortness of breath.   Cardiovascular: Negative for chest pain and palpitations.  Gastrointestinal: Positive for abdominal pain, diarrhea and abdominal distention. Negative for nausea, vomiting, anal bleeding and rectal pain.  Genitourinary: Negative for dysuria, urgency, difficulty urinating and testicular pain.  Musculoskeletal: Negative for arthralgias, gait problem, myalgias and neck pain.  Skin: Positive for wound. Negative for color change and rash.  Neurological: Negative for dizziness, speech difficulty, weakness and numbness.  Hematological: Negative for adenopathy.  Psychiatric/Behavioral: Negative for hallucinations, confusion and agitation.       Objective:   Physical Exam  Constitutional: He is oriented to person, place, and time. He appears well-developed and well-nourished. No distress.  HENT:  Head: Normocephalic.  Mouth/Throat: Oropharynx is clear and moist. No oropharyngeal exudate.  Eyes: Conjunctivae and EOM are normal. Pupils are equal, round, and reactive to light. No scleral icterus.  Neck: Normal range of motion. Neck supple. No tracheal deviation present.  Cardiovascular: Normal rate, regular rhythm and intact distal pulses.   Pulmonary/Chest: Effort normal and breath sounds normal. No respiratory distress.  Abdominal: Soft. He exhibits no distension. There is no tenderness. A hernia is present. Hernia  confirmed positive in the ventral area. Hernia confirmed negative in the right inguinal area and confirmed negative in the left inguinal area.    Musculoskeletal: Normal range of motion. He exhibits no tenderness.  Lymphadenopathy:    He has no cervical adenopathy.       Right: No inguinal adenopathy present.       Left: No inguinal adenopathy present.  Neurological: He is alert and oriented to person, place, and time. No cranial nerve deficit. He exhibits normal muscle tone. Coordination normal.  Skin: Skin is warm and dry. No rash noted. He is not diaphoretic. No erythema. No pallor.  Psychiatric: He has a normal mood and affect. His behavior is normal. Judgment and thought content normal.       Assessment:     Infected prosthetic mesh of giant abscess status post removal and delayed small bowel fistula creation into hernia wound.  Gradually closing and.  Severe deconditioning and malnutrition.     Plan:     Continue aggressive wound care.  There used to prior ostomy although this is more complicated.  Adjusting size as the wound contracts.  We were able to reach [facility] & Mrs. Rickard Rhymes on the phone today.  Set up outpatient follow up [name] supply with [facility] Spiro tomorrow.  Work on getting better team through advanced home care for home enterocutaneous fistula care.  Continue IV nutrition.  Defer to Dr. Selena Batten to help manage the specifics.    Try oral intake with short gut syndrome type of diet.  Continue  iron and Imodium to control output.  They sort of self-cycled TNA at night.  Nutrition numbers are getting better.  Prealbumin 18.  Albumin in the mid 2s.  I tried to get him up for that.  The discouraged, and he did acknowledged that there are signs of improvement.   Evaluate with hernia guru at a major [facility].  [facility] Not responding yet.  We were able to get a hold of a Psychologist, sport and exercise at [facility].  They will see him 11February at 10 AM:  Dr Larey Dresser  [facility]. [address] [place], [place] [phone]  Followup every 3-4 weeks until wound better/more stable.  Hospital bed has been ordered for home DVT deconditioning and morbid obesity.  He continues to not smoke.  I encouraged him to stay motivated for that.  Work towards a more aggressive physical activity as tolerated.  He is rather deconditioned.  He seems to be discouraged.  I tried to stress the importance of exercise.  Consider doing an upper arm weights as a means of helping out as well.  He is trying to do more at home.

## 2013-11-06 ENCOUNTER — Emergency Department (HOSPITAL_COMMUNITY)
Admission: EM | Admit: 2013-11-06 | Discharge: 2013-11-06 | Disposition: A | Discharge status: Home / Self Care | Payer: Medicare Other | Attending: Emergency Medicine | Admitting: Emergency Medicine

## 2013-11-06 ENCOUNTER — Encounter (HOSPITAL_COMMUNITY): Payer: Self-pay | Admitting: Emergency Medicine

## 2013-11-06 DIAGNOSIS — Z87891 Personal history of nicotine dependence: Secondary | ICD-10-CM | POA: Insufficient documentation

## 2013-11-06 DIAGNOSIS — Z9089 Acquired absence of other organs: Secondary | ICD-10-CM | POA: Insufficient documentation

## 2013-11-06 DIAGNOSIS — Z9889 Other specified postprocedural states: Secondary | ICD-10-CM | POA: Insufficient documentation

## 2013-11-06 DIAGNOSIS — Z794 Long term (current) use of insulin: Secondary | ICD-10-CM | POA: Insufficient documentation

## 2013-11-06 DIAGNOSIS — Z433 Encounter for attention to colostomy: Secondary | ICD-10-CM | POA: Insufficient documentation

## 2013-11-06 DIAGNOSIS — E119 Type 2 diabetes mellitus without complications: Secondary | ICD-10-CM | POA: Insufficient documentation

## 2013-11-06 DIAGNOSIS — I10 Essential (primary) hypertension: Secondary | ICD-10-CM | POA: Insufficient documentation

## 2013-11-06 DIAGNOSIS — K458 Other specified abdominal hernia without obstruction or gangrene: Secondary | ICD-10-CM | POA: Insufficient documentation

## 2013-11-06 DIAGNOSIS — E785 Hyperlipidemia, unspecified: Secondary | ICD-10-CM | POA: Insufficient documentation

## 2013-11-06 DIAGNOSIS — Z8669 Personal history of other diseases of the nervous system and sense organs: Secondary | ICD-10-CM | POA: Insufficient documentation

## 2013-11-06 DIAGNOSIS — I509 Heart failure, unspecified: Secondary | ICD-10-CM | POA: Insufficient documentation

## 2013-11-06 NOTE — ED Provider Notes (Signed)
Medical screening examination/treatment/procedure(s) were conducted as a shared visit with non-physician practitioner(s) and myself.  I personally evaluated the patient during the encounter.  EKG Interpretation   None        Orlie Dakin, MD 11/06/13 2131

## 2013-11-06 NOTE — ED Notes (Signed)
Initial Contact - pt to [facility] via EMS, from home with c/o leaking colostomy.  Pt with pink/moist stoma with sm. Amt formed brown stool in colostomy bag.  Pt also with large surgical wound surrounding stoma, approx 5-6" in diameter.  Pt sts home health only provided typical colostomy bags and he has thus been using the same bag x3 weeks from when he was d/c'd from hospital after abd surgery.  Current bag is a 9.7 and is leaking from distal area of bag.  Pt denies all other complaints, well appearing, NAD.  Awaiting EDP eval.

## 2013-11-06 NOTE — Discharge Instructions (Signed)
Call the Wound care center for evaluation.

## 2013-11-06 NOTE — ED Notes (Signed)
Bed: [facility]06 Expected date: 11/06/13 Expected time: 6:44 PM Means of arrival:  Comments: Colostomy leaking

## 2013-11-06 NOTE — ED Notes (Signed)
Pt from home with colostomy leaking 2/2 unable to obtain large enough bag to fit over stoma.  Pt denies other complaints.

## 2013-11-06 NOTE — ED Provider Notes (Signed)
Patient states his colostomy bag has been leaking since yesterday afternoon. No other complaint he was sent wrong size bagfrom medical supply  Orlie Dakin, MD 11/06/13 [phone]

## 2013-11-06 NOTE — ED Provider Notes (Signed)
CSN: [id number]     Arrival date & time 11/06/13  1848 History   First MD Initiated Contact with Patient 11/06/13 1850     Chief Complaint  Patient presents with  . Colostomy Problem    (Consider location/radiation/quality/duration/timing/severity/associated sxs/prior Treatment) HPI Comments: Pt is requesting a large colostomy bag.  Pt has a 9.7  Bag on from the hospital.  Pt reports he was sent the wrong size bag.  Pt reports the bag he has on is leaking.  (Bag pt has on ws placed at this hospital) Pt reports Dr. Johney Maine has been trying to get appropriate supplys.   Pt has a small bag with him that he reports is what home health gave him.   Pt reports bag is to small.   Pt has a large open area.   Bag placement is complicated by a hernia and large open wound.   The history is provided by the patient. No language interpreter was used.    Past Medical History  Diagnosis Date  . Sleep apnea   . Diabetes mellitus   . Hypertension   . Hyperlipidemia   . CHF (congestive heart failure)   . Hernia   . AAA (abdominal aortic aneurysm)/ 4.5 cm ascending per CT angio 08/01/13 08/12/2013  . 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 Constipation, chronic 08/03/2013   Past Surgical History  Procedure Laterality Date  . Appendectomy    . Tonsillectomy    . Nasal septum surgery    . Bowel resection  1980s?    ?colectomy/ostomy - Dr Elesa Hacker  . Ostomy take down  1980s?    Dr Elesa Hacker  . Incisional hernia repair  1980s?    Dr. Lindon Romp - w mesh  . Knee surgery Right   . Incision and drainage abscess N/A 08/14/2013    Procedure: INCISION AND DRAINAGE ABSCESS, mesh ;  Surgeon: [name], MD;  Location: [facility];  Service: General;  Laterality: N/A;   No family history on file. History  Substance Use Topics  . Smoking status: Former Smoker -- 1.00 packs/day for 40 years    Types: Cigarettes    Quit date: 08/21/2012  . Smokeless tobacco:  Never Used  . Alcohol Use: No    Review of Systems  Skin: Positive for wound.  All other systems reviewed and are negative.    Allergies  Review of patient's allergies indicates no known allergies.  Home Medications   Current Outpatient Rx  Name  Route  Sig  Dispense  Refill  . ACCU-CHEK FASTCLIX LANCETS MISC               . ACCU-CHEK SMARTVIEW test strip               . acetaminophen (TYLENOL) 500 MG tablet   Oral   Take 2 tablets (1,000 mg total) by mouth 3 (three) times daily.   30 tablet   0   . alum & mag hydroxide-simeth (MAALOX/MYLANTA) 200-200-20 MG/5ML suspension   Oral   Take 30 mLs by mouth every 6 (six) hours as needed for indigestion or heartburn (dyspepsia).   355 mL   0   . amLODipine (NORVASC) 10 MG tablet   Oral   Take 1 tablet (10 mg total) by mouth daily.   30 tablet   0   . antiseptic oral rinse (BIOTENE) LIQD   Mouth Rinse   15 mLs by Mouth Rinse  route 2 times daily at 12 noon and 4 pm.   473 mL   0   . apixaban (ELIQUIS) 5 MG TABS tablet   Oral   Take 1 tablet (5 mg total) by mouth 2 (two) times daily.   60 tablet   1   . Blood Glucose Monitoring Suppl (ACCU-CHEK NANO SMARTVIEW) W/DEVICE KIT               . carvedilol (COREG) 3.125 MG tablet   Oral   Take 1 tablet (3.125 mg total) by mouth 2 (two) times daily with a meal.   60 tablet   3   . ferrous sulfate 325 (65 FE) MG tablet   Oral   Take 1 tablet (325 mg total) by mouth 2 (two) times daily.   60 tablet   3   . furosemide (LASIX) 40 MG tablet   Oral   Take 1 tablet (40 mg total) by mouth daily.   30 tablet   3   . HUMULIN R 100 UNIT/ML injection               . hydrALAZINE (APRESOLINE) 50 MG tablet   Oral   Take 1 tablet (50 mg total) by mouth every 8 (eight) hours.   90 tablet   0   . insulin aspart (NOVOLOG) 100 UNIT/ML injection   Subcutaneous   Inject 0-9 Units into the skin 3 (three) times daily with meals.   1 vial   12   . insulin  glargine (LANTUS) 100 UNIT/ML injection   Subcutaneous   Inject 0.35 mLs (35 Units total) into the skin at bedtime.   10 mL   12   . isosorbide mononitrate (IMDUR) 30 MG 24 hr tablet   Oral   Take 1 tablet (30 mg total) by mouth daily.   30 tablet   0   . lisinopril (PRINIVIL,ZESTRIL) 10 MG tablet               . loperamide (IMODIUM) 2 MG capsule   Oral   Take 1 capsule (2 mg total) by mouth 2 (two) times daily.   30 capsule   0   . loperamide (IMODIUM) 2 MG capsule   Oral   Take 1-2 capsules (2-4 mg total) by mouth every 8 (eight) hours as needed for diarrhea or loose stools (Use if >2 BM every 8 hours).   60 capsule   5   . octreotide (SANDOSTATIN) 100 MCG/ML SOLN injection   Subcutaneous   Inject 1.5 mLs (150 mcg total) into the skin 3 (three) times daily.   90 mL   0   . OxyCODONE (OXYCONTIN) 20 mg T12A 12 hr tablet   Oral   Take 1 tablet (20 mg total) by mouth every 12 (twelve) hours.   60 tablet   0   . oxyCODONE (ROXICODONE) 15 MG immediate release tablet   Oral   Take 1 tablet (15 mg total) by mouth every 4 (four) hours as needed.   65 tablet   0   . oxyCODONE-acetaminophen (PERCOCET) 10-325 MG per tablet               . polyvinyl alcohol (LIQUIFILM TEARS) 1.4 % ophthalmic solution   Both Eyes   Place 2 drops into both eyes as needed for dry eyes.   15 mL   0   . potassium chloride SA (K-DUR,KLOR-CON) 20 MEQ tablet   Oral   Take 2 tablets (40  mEq total) by mouth daily.   30 tablet   0   . rosuvastatin (CRESTOR) 20 MG tablet   Oral   Take 20 mg by mouth every other day.         [name] Kitchen saccharomyces boulardii (FLORASTOR) 250 MG capsule   Oral   Take 1 capsule (250 mg total) by mouth 2 (two) times daily.   60 capsule   0   . spironolactone (ALDACTONE) 100 MG tablet               . EXPIRED: zolpidem (AMBIEN) 10 MG tablet   Oral   Take 1 tablet (10 mg total) by mouth at bedtime.   30 tablet   0    BP 104/58  Pulse 76   Temp(Src) 98.7 F (37.1 C) (Oral)  Resp 19  SpO2 96% Physical Exam  Nursing note and vitals reviewed. Constitutional: He appears well-developed and well-nourished.  HENT:  Head: Normocephalic and atraumatic.  Eyes: Conjunctivae and EOM are normal. Pupils are equal, round, and reactive to light.  Neck: Normal range of motion. Neck supple.  Cardiovascular: Normal rate.   Pulmonary/Chest: Effort normal.  Abdominal: Soft.  Large skin wound,  Hernia,   Stoma looks healthy  Musculoskeletal: Normal range of motion.  Neurological: He is alert.  Skin: Skin is warm.  Psychiatric: He has a normal mood and affect.    ED Course  Procedures (including critical care time) Labs Review Labs Reviewed - No data to display Imaging Review No results found.  EKG Interpretation   None       MDM   1. Colostomy care     Lillibeth RN cleaned wound and showed pt how to use bags.   Pt referred to wound center.  [place], PA-C 11/06/13 2036

## 2013-11-09 ENCOUNTER — Encounter (INDEPENDENT_AMBULATORY_CARE_PROVIDER_SITE_OTHER): Payer: Self-pay

## 2013-11-09 ENCOUNTER — Telehealth (INDEPENDENT_AMBULATORY_CARE_PROVIDER_SITE_OTHER): Payer: Self-pay | Admitting: General Surgery

## 2013-11-09 NOTE — Telephone Encounter (Signed)
Diane, occupational therapist with [place], called to report that she (with pt's full approval) is suspending her work with him for now.  He is still receiving nursing and physical therapy, but the pt is not participating or interested in OT at this point in time.

## 2013-11-10 ENCOUNTER — Telehealth (INDEPENDENT_AMBULATORY_CARE_PROVIDER_SITE_OTHER): Payer: Self-pay | Admitting: General Surgery

## 2013-11-10 NOTE — Telephone Encounter (Signed)
As I stated before, please have all TNA issues sent to the primary care physician, Dr. Maudie Mercury.  I am OK with the pharmacy recommendations but I did not want to be on long-term management of the TNA at this time.

## 2013-11-10 NOTE — Telephone Encounter (Signed)
Pharmacist, Earnest Bailey, with [place] has just been speaking with pt's wife after she reviewed the most recent labs; they showed an increase in BUN, Creat and glucose.  Wife stated they were not infusing the full 3 liters of TPN, but they have been doing better since Monday.  Wife states she is still not taking po solids very well.  The pharmacist wants to recommend (1) decreasing his calories and dextrose, (2) scale back the cycle of infusion from 24 hours to 16-18 hours a day, and (3) repeat the BMP tomorrow.  Please advise.  Can reach [place] at [phone] extension [phone] or Miranda at [place].

## 2013-11-11 ENCOUNTER — Inpatient Hospital Stay (HOSPITAL_COMMUNITY)
Admission: EM | Admit: 2013-11-11 | Discharge: 2013-11-18 | DRG: 872 | Disposition: A | Discharge status: Home Health | Payer: Medicare Other | Attending: Internal Medicine | Admitting: Internal Medicine

## 2013-11-11 ENCOUNTER — Emergency Department (HOSPITAL_COMMUNITY): Payer: Medicare Other

## 2013-11-11 ENCOUNTER — Encounter (HOSPITAL_COMMUNITY): Payer: Self-pay | Admitting: Emergency Medicine

## 2013-11-11 DIAGNOSIS — T8579XA Infection and inflammatory reaction due to other internal prosthetic devices, implants and grafts, initial encounter: Secondary | ICD-10-CM

## 2013-11-11 DIAGNOSIS — E876 Hypokalemia: Secondary | ICD-10-CM | POA: Diagnosis present

## 2013-11-11 DIAGNOSIS — N179 Acute kidney failure, unspecified: Secondary | ICD-10-CM

## 2013-11-11 DIAGNOSIS — I5042 Chronic combined systolic (congestive) and diastolic (congestive) heart failure: Secondary | ICD-10-CM

## 2013-11-11 DIAGNOSIS — IMO0001 Reserved for inherently not codable concepts without codable children: Secondary | ICD-10-CM

## 2013-11-11 DIAGNOSIS — I10 Essential (primary) hypertension: Secondary | ICD-10-CM

## 2013-11-11 DIAGNOSIS — K432 Incisional hernia without obstruction or gangrene: Secondary | ICD-10-CM

## 2013-11-11 DIAGNOSIS — K912 Postsurgical malabsorption, not elsewhere classified: Secondary | ICD-10-CM | POA: Diagnosis present

## 2013-11-11 DIAGNOSIS — R739 Hyperglycemia, unspecified: Secondary | ICD-10-CM

## 2013-11-11 DIAGNOSIS — I4891 Unspecified atrial fibrillation: Secondary | ICD-10-CM

## 2013-11-11 DIAGNOSIS — R5383 Other fatigue: Secondary | ICD-10-CM

## 2013-11-11 DIAGNOSIS — R5381 Other malaise: Secondary | ICD-10-CM | POA: Diagnosis present

## 2013-11-11 DIAGNOSIS — I129 Hypertensive chronic kidney disease with stage 1 through stage 4 chronic kidney disease, or unspecified chronic kidney disease: Secondary | ICD-10-CM | POA: Diagnosis present

## 2013-11-11 DIAGNOSIS — N183 Chronic kidney disease, stage 3 unspecified: Secondary | ICD-10-CM | POA: Diagnosis present

## 2013-11-11 DIAGNOSIS — Z87891 Personal history of nicotine dependence: Secondary | ICD-10-CM

## 2013-11-11 DIAGNOSIS — Z9119 Patient's noncompliance with other medical treatment and regimen: Secondary | ICD-10-CM

## 2013-11-11 DIAGNOSIS — E1122 Type 2 diabetes mellitus with diabetic chronic kidney disease: Secondary | ICD-10-CM | POA: Diagnosis present

## 2013-11-11 DIAGNOSIS — R6520 Severe sepsis without septic shock: Secondary | ICD-10-CM

## 2013-11-11 DIAGNOSIS — I714 Abdominal aortic aneurysm, without rupture, unspecified: Secondary | ICD-10-CM | POA: Diagnosis present

## 2013-11-11 DIAGNOSIS — Z6841 Body Mass Index (BMI) 40.0 and over, adult: Secondary | ICD-10-CM

## 2013-11-11 DIAGNOSIS — D72829 Elevated white blood cell count, unspecified: Secondary | ICD-10-CM | POA: Diagnosis present

## 2013-11-11 DIAGNOSIS — I509 Heart failure, unspecified: Secondary | ICD-10-CM | POA: Diagnosis present

## 2013-11-11 DIAGNOSIS — Z7901 Long term (current) use of anticoagulants: Secondary | ICD-10-CM

## 2013-11-11 DIAGNOSIS — I4892 Unspecified atrial flutter: Secondary | ICD-10-CM | POA: Diagnosis present

## 2013-11-11 DIAGNOSIS — J9620 Acute and chronic respiratory failure, unspecified whether with hypoxia or hypercapnia: Secondary | ICD-10-CM

## 2013-11-11 DIAGNOSIS — Z933 Colostomy status: Secondary | ICD-10-CM

## 2013-11-11 DIAGNOSIS — K632 Fistula of intestine: Secondary | ICD-10-CM

## 2013-11-11 DIAGNOSIS — Z95810 Presence of automatic (implantable) cardiac defibrillator: Secondary | ICD-10-CM

## 2013-11-11 DIAGNOSIS — R4182 Altered mental status, unspecified: Secondary | ICD-10-CM

## 2013-11-11 DIAGNOSIS — G4733 Obstructive sleep apnea (adult) (pediatric): Secondary | ICD-10-CM

## 2013-11-11 DIAGNOSIS — E875 Hyperkalemia: Secondary | ICD-10-CM

## 2013-11-11 DIAGNOSIS — E871 Hypo-osmolality and hyponatremia: Secondary | ICD-10-CM | POA: Diagnosis present

## 2013-11-11 DIAGNOSIS — E872 Acidosis, unspecified: Secondary | ICD-10-CM | POA: Diagnosis present

## 2013-11-11 DIAGNOSIS — E861 Hypovolemia: Secondary | ICD-10-CM | POA: Diagnosis present

## 2013-11-11 DIAGNOSIS — E66813 Obesity, class 3: Secondary | ICD-10-CM | POA: Diagnosis present

## 2013-11-11 DIAGNOSIS — Z794 Long term (current) use of insulin: Secondary | ICD-10-CM

## 2013-11-11 DIAGNOSIS — A419 Sepsis, unspecified organism: Principal | ICD-10-CM

## 2013-11-11 DIAGNOSIS — K90829 Short bowel syndrome, unspecified: Secondary | ICD-10-CM | POA: Diagnosis present

## 2013-11-11 DIAGNOSIS — Z91199 Patient's noncompliance with other medical treatment and regimen due to unspecified reason: Secondary | ICD-10-CM

## 2013-11-11 DIAGNOSIS — E1165 Type 2 diabetes mellitus with hyperglycemia: Secondary | ICD-10-CM

## 2013-11-11 DIAGNOSIS — I959 Hypotension, unspecified: Secondary | ICD-10-CM

## 2013-11-11 DIAGNOSIS — L089 Local infection of the skin and subcutaneous tissue, unspecified: Secondary | ICD-10-CM | POA: Diagnosis present

## 2013-11-11 DIAGNOSIS — E785 Hyperlipidemia, unspecified: Secondary | ICD-10-CM

## 2013-11-11 LAB — POCT I-STAT, CHEM 8
BUN: 137 mg/dL — AB (ref 6–23)
Calcium, Ion: 1.49 mmol/L — ABNORMAL HIGH (ref 1.13–1.30)
Chloride: 106 mEq/L (ref 96–112)
Creatinine, Ser: 2.8 mg/dL — ABNORMAL HIGH (ref 0.50–1.35)
Glucose, Bld: 210 mg/dL — ABNORMAL HIGH (ref 70–99)
HCT: 45 % (ref 39.0–52.0)
HEMOGLOBIN: 15.3 g/dL (ref 13.0–17.0)
Potassium: 6.8 mEq/L (ref 3.7–5.3)
SODIUM: 130 meq/L — AB (ref 137–147)
TCO2: 15 mmol/L (ref 0–100)

## 2013-11-11 LAB — CG4 I-STAT (LACTIC ACID): Lactic Acid, Venous: 0.76 mmol/L (ref 0.5–2.2)

## 2013-11-11 LAB — POCT I-STAT TROPONIN I: Troponin i, poc: 0 ng/mL (ref 0.00–0.08)

## 2013-11-11 MED ORDER — SODIUM CHLORIDE 0.9 % IV SOLN
1000.0000 mL | INTRAVENOUS | Status: DC
  Administered 2013-11-12 (×2): 1000 mL via INTRAVENOUS

## 2013-11-11 MED ORDER — SODIUM CHLORIDE 0.9 % IV SOLN
1000.0000 mL | Freq: Once | INTRAVENOUS | Status: AC
  Administered 2013-11-11: 1000 mL via INTRAVENOUS

## 2013-11-11 NOTE — ED Provider Notes (Signed)
CSN: [id number]     Arrival date & time 11/11/13  2237 History   First MD Initiated Contact with Patient 11/11/13 2315     Chief Complaint  Patient presents with  . Weakness   (Consider location/radiation/quality/duration/timing/severity/associated sxs/prior Treatment) HPI Comments: Mr. Plucinski is a 63 year old male who is morbidly obese and unfortunately has a history of congestive heart failure, diabetes as well as a recent diagnosis of a very large abdominal wall abscess that required a significant surgical debridement and a prolonged wound healing course and that has left him on total parenteral nutrition, and debilitated at home at the patient's request instead of in a nursing facility. His wife states that he is on continuous TPN, over the last week he has had several blood draws which have shown a gradually rising potassium level and renal dysfunction though the wife does not know the exact numbers. She was told to bring him to the hospital tonight because of his elevated potassium which was reportedly over 7, he has been difficult to arouse over the last 2 days and hasn't been somnolent or lethargic. There has been no fevers, no vomiting, no coughing, no focal complaints though he has been generally weak and unable to get out of bed. She has been caring for his enterocutaneous fistula with ostomy bags which she has fastened in a very unique way because of the size of his wound. The patient is followed in the surgical clinic and is being closely watched by both his internist Dr. Maudie Mercury as well as his general surgeon Dr. Johney Maine.  The patient is altered/somnolent  and unable to contribute to his history  Patient is a 63 y.o. male presenting with weakness. The history is provided by the patient, the spouse and medical records.  Weakness    Past Medical History  Diagnosis Date  . Sleep apnea   . Diabetes mellitus   . Hypertension   . Hyperlipidemia   . CHF (congestive heart failure)   . Hernia   .  AAA (abdominal aortic aneurysm)/ 4.5 cm ascending per CT angio 08/01/13 08/12/2013  . 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 Constipation, chronic 08/03/2013   Past Surgical History  Procedure Laterality Date  . Appendectomy    . Tonsillectomy    . Nasal septum surgery    . Bowel resection  1980s?    ?colectomy/ostomy - Dr Elesa Hacker  . Ostomy take down  1980s?    Dr Elesa Hacker  . Incisional hernia repair  1980s?    Dr. Lindon Romp - w mesh  . Knee surgery Right   . Incision and drainage abscess N/A 08/14/2013    Procedure: INCISION AND DRAINAGE ABSCESS, mesh ;  Surgeon: [name], MD;  Location: [facility];  Service: General;  Laterality: N/A;   No family history on file. History  Substance Use Topics  . Smoking status: Former Smoker -- 1.00 packs/day for 40 years    Types: Cigarettes    Quit date: 08/21/2012  . Smokeless tobacco: Never Used  . Alcohol Use: No    Review of Systems  Unable to perform ROS: Mental status change  Neurological: Positive for weakness.    Allergies  Review of patient's allergies indicates no known allergies.  Home Medications   Current Outpatient Rx  Name  Route  Sig  Dispense  Refill  . ADULT TPN   Intravenous   Inject into the vein continuous. Patient starts at 7pm-7am  each night. Gets TPN at [facility] with lipids 3033ml  Additives  46ml infuvite 60 units humulin R Infuse at 124ml/hr         . carvedilol (COREG) 6.25 MG tablet   Oral   Take 6.25 mg by mouth 2 (two) times daily with a meal.         . furosemide (LASIX) 80 MG tablet   Oral   Take 80 mg by mouth 2 (two) times daily.         [name] Kitchen HUMULIN R 100 UNIT/ML injection   Intravenous   Inject 60 Units into the vein See admin instructions. Takes in his TPN at 2045         . insulin aspart (NOVOLOG) 100 UNIT/ML injection   Subcutaneous   Inject 12 Units into the skin 2 (two) times daily.         .  insulin NPH Human (HUMULIN N,NOVOLIN N) 100 UNIT/ML injection   Subcutaneous   Inject 40 Units into the skin 2 (two) times daily before a meal.         . lisinopril (PRINIVIL,ZESTRIL) 10 MG tablet   Oral   Take 10 mg by mouth 2 (two) times daily.          [name] Kitchen loperamide (IMODIUM) 2 MG capsule   Oral   Take 1 capsule (2 mg total) by mouth 2 (two) times daily.   30 capsule   0   . OxyCODONE (OXYCONTIN) 20 mg T12A 12 hr tablet   Oral   Take 1 tablet (20 mg total) by mouth every 12 (twelve) hours.   60 tablet   0   . oxyCODONE-acetaminophen (PERCOCET) 10-325 MG per tablet   Oral   Take 1 tablet by mouth every 6 (six) hours as needed for pain.          . potassium chloride SA (K-DUR,KLOR-CON) 20 MEQ tablet   Oral   Take 2 tablets (40 mEq total) by mouth daily.   30 tablet   0   . rosuvastatin (CRESTOR) 20 MG tablet   Oral   Take 20 mg by mouth every other day.         . spironolactone (ALDACTONE) 100 MG tablet   Oral   Take 100 mg by mouth daily.          BP 103/60  Pulse 66  Temp(Src) 100.6 F (38.1 C) (Rectal)  Resp 23  SpO2 98% Physical Exam  Nursing note and vitals reviewed. Constitutional:  Morbidly obese, somnolent but arousable to loud voice  HENT:  Head: Normocephalic and atraumatic.  Mouth/Throat: No oropharyngeal exudate.  Mucous membranes appear dehydrated  Eyes: Conjunctivae and EOM are normal. Pupils are equal, round, and reactive to light. Right eye exhibits no discharge. Left eye exhibits no discharge. No scleral icterus.  Neck: Normal range of motion. Neck supple. No JVD present. No thyromegaly present.  Cardiovascular: Normal rate, normal heart sounds and intact distal pulses.  Exam reveals no gallop and no friction rub.   No murmur heard. Slightly irregular rhythm, cardiac monitor reveals underlying atrial flutter with variable ventricular rate, approximately 60 beats per minute  Pulmonary/Chest: Effort normal and breath sounds normal. No  respiratory distress. He has no wheezes. He has no rales.  Abdominal: Soft. Bowel sounds are normal. He exhibits distension. He exhibits no mass. There is tenderness. There is no rebound and no guarding.  Morbidly obese abdomen which is diffusely mildly tender, decreased bowel sounds,  ostomy with stool type drainage   Musculoskeletal: Normal range of motion. He exhibits no edema and no tenderness.  Lymphadenopathy:    He has no cervical adenopathy.  Neurological:  Somnolent, arousable, falls asleep within 10 seconds of opening his eyes, follows commands when awake  Skin: Skin is warm and dry. No rash noted. There is erythema (minimal erythema surrounding enterocutaneous fistula).  Psychiatric: He has a normal mood and affect. His behavior is normal.    ED Course  Procedures (including critical care time) Labs Review Labs Reviewed  CBC WITH DIFFERENTIAL - Abnormal; Notable for the following:    WBC 16.1 (*)    Neutro Abs 11.8 (*)    Monocytes Absolute 1.4 (*)    All other components within normal limits  COMPREHENSIVE METABOLIC PANEL - Abnormal; Notable for the following:    Sodium 127 (*)    Potassium 6.8 (*)    CO2 16 (*)    Glucose, Bld 202 (*)    BUN 121 (*)    Creatinine, Ser 2.77 (*)    Calcium 10.9 (*)    Total Protein 8.9 (*)    Albumin 3.2 (*)    ALT 57 (*)    GFR calc non Af Amer 23 (*)    GFR calc Af Amer 27 (*)    All other components within normal limits  PROTIME-INR - Abnormal; Notable for the following:    Prothrombin Time 15.9 (*)    All other components within normal limits  POCT I-STAT, CHEM 8 - Abnormal; Notable for the following:    Sodium 130 (*)    Potassium 6.8 (*)    BUN 137 (*)    Creatinine, Ser 2.80 (*)    Glucose, Bld 210 (*)    Calcium, Ion 1.49 (*)    All other components within normal limits  CULTURE, BLOOD (ROUTINE X 2)  CULTURE, BLOOD (ROUTINE X 2)  URINE CULTURE  APTT  PROCALCITONIN  URINALYSIS, ROUTINE W REFLEX MICROSCOPIC  CG4  I-STAT (LACTIC ACID)  POCT I-STAT TROPONIN I   Imaging Review Dg Chest Port 1 View  11/12/2013   CLINICAL DATA:  Weakness and body aches.  EXAM: PORTABLE CHEST - 1 VIEW  COMPARISON:  Single view of the chest 10/15/2013.  FINDINGS: Three-view pacing device in place. Right PICC is again seen. Lungs are clear. Heart size is mildly enlarged. No pneumothorax or pleural effusion.  IMPRESSION: No acute disease.   Electronically Signed   By: Inge Rise M.D.   On: 11/12/2013 00:10    EKG Interpretation    Date/Time:  Thursday November 11 2013 23:43:17 EST Ventricular Rate:  74 PR Interval:  204 QRS Duration: 120 QT Interval:  375 QTC Calculation: 416 R Axis:   -62 Text Interpretation:  paced rhythm with underlying atrial flutter Nonspecific IVCD with LAD Inferior infarct, old Anterior infarct, old Lateral leads are also involved since last tracing no significant change Confirmed by Jawaan Adachi  MD, Artavis Cowie ([id number]) on 11/12/2013 12:00:48 AM            MDM   1. Hyperkalemia   2. Acute renal failure   3. Hypotension   4. Hyperglycemia   5. Altered mental state    The patient is hypotensive, current blood pressure 85 systolic, pulse around 60 and temperature afebrile. Oxygen saturation is 95% on room air. The patient is altered, I am concerned that his altered mental status could be from dehydration, renal failure, hyperkalemia. EKG pending, labs, IV fluid resuscitation  for the patient's hypotension. Patient appears to be critically ill, he could be septic, this could be metabolic, could be related to electrolytes. Would also consider abdominal sources such as recurrent abscess, bowel obstruction.  Laboratory workup shows that the patient is hyponatremic, hypokalemic, and acute renal failure with a creatinine of 2.8, last measured at 0.9. He is also hyperglycemic though not significantly. At this point the patient has had 2 separated and confirmed measurements of hyperkalemia and lower quire  aggressive management. His underlying EKG shows a paced wide complex rhythm which is unchanged thus the hyperkalemia may be masked.  His CO2 is 15, this would suggest somewhat of an acidosis likely related to his renal failure and TPN use. His hypotension will be treated with IV fluids, will give bicarbonate as well as insulin and D50  Meds given, blood pressure has improved significantly after IV fluids, would consider infection as well as the patient has had PICC line in place for several months, there is no redness or drainage from the site however the patient does have discomfort at this area. Broad-spectrum antibiotics order to cover.  D/w Dr. Alcario Drought who will admit to step down.  CRITICAL CARE Performed by: Johnna Acosta Total critical care time: 35 Critical care time was exclusive of separately billable procedures and treating other patients. Critical care was necessary to treat or prevent imminent or life-threatening deterioration. Critical care was time spent personally by me on the following activities: development of treatment plan with patient and/or surrogate as well as nursing, discussions with consultants, evaluation of patient's response to treatment, examination of patient, obtaining history from patient or surrogate, ordering and performing treatments and interventions, ordering and review of laboratory studies, ordering and review of radiographic studies, pulse oximetry and re-evaluation of patient's condition.   Johnna Acosta, MD 11/12/13 [phone]

## 2013-11-11 NOTE — ED Notes (Signed)
Patient arrives via EMS for c/o increased weakness x 3 days and decreased ability to ambulate Patient with c/o generalized pain, which is a chronic issue  Patient appears in NAD

## 2013-11-11 NOTE — ED Notes (Signed)
MD Sabra Heck made aware of the pts critical potassium.

## 2013-11-11 NOTE — ED Notes (Signed)
Bed: [id number] Expected date:  Expected time:  Means of arrival:  Comments: EMS 63yo M weakness, dec ambulation

## 2013-11-11 NOTE — Telephone Encounter (Signed)
Called Miranda, pharmcist at [facility], with response from Dr. Johney Maine re:  TNA management.  Provided them with Dr. [name] [phone]) to contact for long-term management.  She understands and will update their records for future issues.

## 2013-11-12 ENCOUNTER — Inpatient Hospital Stay (HOSPITAL_COMMUNITY): Payer: Medicare Other

## 2013-11-12 DIAGNOSIS — E875 Hyperkalemia: Secondary | ICD-10-CM

## 2013-11-12 DIAGNOSIS — I5042 Chronic combined systolic (congestive) and diastolic (congestive) heart failure: Secondary | ICD-10-CM

## 2013-11-12 DIAGNOSIS — I509 Heart failure, unspecified: Secondary | ICD-10-CM

## 2013-11-12 DIAGNOSIS — G4733 Obstructive sleep apnea (adult) (pediatric): Secondary | ICD-10-CM

## 2013-11-12 DIAGNOSIS — A419 Sepsis, unspecified organism: Secondary | ICD-10-CM

## 2013-11-12 DIAGNOSIS — E1065 Type 1 diabetes mellitus with hyperglycemia: Secondary | ICD-10-CM

## 2013-11-12 DIAGNOSIS — J9620 Acute and chronic respiratory failure, unspecified whether with hypoxia or hypercapnia: Secondary | ICD-10-CM

## 2013-11-12 DIAGNOSIS — R6520 Severe sepsis without septic shock: Secondary | ICD-10-CM | POA: Diagnosis present

## 2013-11-12 DIAGNOSIS — IMO0002 Reserved for concepts with insufficient information to code with codable children: Secondary | ICD-10-CM

## 2013-11-12 DIAGNOSIS — N179 Acute kidney failure, unspecified: Secondary | ICD-10-CM

## 2013-11-12 DIAGNOSIS — I959 Hypotension, unspecified: Secondary | ICD-10-CM

## 2013-11-12 DIAGNOSIS — R4182 Altered mental status, unspecified: Secondary | ICD-10-CM

## 2013-11-12 HISTORY — DX: Hyperkalemia: E87.5

## 2013-11-12 HISTORY — DX: Acute kidney failure, unspecified: N17.9

## 2013-11-12 LAB — CBC WITH DIFFERENTIAL/PLATELET
BASOS ABS: 0 10*3/uL (ref 0.0–0.1)
Basophils Relative: 0 % (ref 0–1)
EOS ABS: 0.2 10*3/uL (ref 0.0–0.7)
Eosinophils Relative: 1 % (ref 0–5)
HEMATOCRIT: 39.5 % (ref 39.0–52.0)
Hemoglobin: 13.4 g/dL (ref 13.0–17.0)
LYMPHS ABS: 2.7 10*3/uL (ref 0.7–4.0)
Lymphocytes Relative: 17 % (ref 12–46)
MCH: 30.6 pg (ref 26.0–34.0)
MCHC: 33.9 g/dL (ref 30.0–36.0)
MCV: 90.2 fL (ref 78.0–100.0)
MONO ABS: 1.4 10*3/uL — AB (ref 0.1–1.0)
Monocytes Relative: 9 % (ref 3–12)
NEUTROS ABS: 11.8 10*3/uL — AB (ref 1.7–7.7)
Neutrophils Relative %: 73 % (ref 43–77)
PLATELETS: 277 10*3/uL (ref 150–400)
RBC: 4.38 MIL/uL (ref 4.22–5.81)
RDW: 14.3 % (ref 11.5–15.5)
WBC: 16.1 10*3/uL — AB (ref 4.0–10.5)

## 2013-11-12 LAB — BASIC METABOLIC PANEL
BUN: 118 mg/dL — AB (ref 6–23)
BUN: 118 mg/dL — ABNORMAL HIGH (ref 6–23)
BUN: 113 mg/dL — ABNORMAL HIGH (ref 6–23)
BUN: 104 mg/dL — ABNORMAL HIGH (ref 6–23)
CALCIUM: 10.5 mg/dL (ref 8.4–10.5)
CALCIUM: 9.9 mg/dL (ref 8.4–10.5)
CHLORIDE: 100 meq/L (ref 96–112)
CO2: 14 mEq/L — ABNORMAL LOW (ref 19–32)
CO2: 15 mEq/L — ABNORMAL LOW (ref 19–32)
CO2: 17 mEq/L — ABNORMAL LOW (ref 19–32)
CO2: 19 mEq/L (ref 19–32)
CREATININE: 3.02 mg/dL — AB (ref 0.50–1.35)
Calcium: 10.8 mg/dL — ABNORMAL HIGH (ref 8.4–10.5)
Calcium: 10 mg/dL (ref 8.4–10.5)
Chloride: 101 mEq/L (ref 96–112)
Chloride: 99 mEq/L (ref 96–112)
Chloride: 99 mEq/L (ref 96–112)
Creatinine, Ser: 2.91 mg/dL — ABNORMAL HIGH (ref 0.50–1.35)
Creatinine, Ser: 2.78 mg/dL — ABNORMAL HIGH (ref 0.50–1.35)
Creatinine, Ser: 2.73 mg/dL — ABNORMAL HIGH (ref 0.50–1.35)
GFR calc Af Amer: 25 mL/min — ABNORMAL LOW (ref >90)
GFR calc Af Amer: 24 mL/min — ABNORMAL LOW (ref >90)
GFR calc non Af Amer: 22 mL/min — ABNORMAL LOW (ref >90)
GFR calc non Af Amer: 21 mL/min — ABNORMAL LOW (ref >90)
GFR calc non Af Amer: 24 mL/min — ABNORMAL LOW (ref >90)
GFR, EST AFRICAN AMERICAN: 27 mL/min — AB (ref >90)
GFR, EST AFRICAN AMERICAN: 27 mL/min — AB (ref >90)
GFR, EST NON AFRICAN AMERICAN: 23 mL/min — AB (ref >90)
GLUCOSE: 154 mg/dL — AB (ref 70–99)
Glucose, Bld: 197 mg/dL — ABNORMAL HIGH (ref 70–99)
Glucose, Bld: 220 mg/dL — ABNORMAL HIGH (ref 70–99)
Glucose, Bld: 234 mg/dL — ABNORMAL HIGH (ref 70–99)
POTASSIUM: 6.4 meq/L — AB (ref 3.7–5.3)
POTASSIUM: 6.3 meq/L — AB (ref 3.7–5.3)
Potassium: 7 mEq/L (ref 3.7–5.3)
Potassium: 6.5 mEq/L (ref 3.7–5.3)
Sodium: 129 mEq/L — ABNORMAL LOW (ref 137–147)
Sodium: 130 mEq/L — ABNORMAL LOW (ref 137–147)
Sodium: 130 mEq/L — ABNORMAL LOW (ref 137–147)
Sodium: 131 mEq/L — ABNORMAL LOW (ref 137–147)

## 2013-11-12 LAB — GLUCOSE, CAPILLARY
GLUCOSE-CAPILLARY: 156 mg/dL — AB (ref 70–99)
Glucose-Capillary: 135 mg/dL — ABNORMAL HIGH (ref 70–99)
Glucose-Capillary: 153 mg/dL — ABNORMAL HIGH (ref 70–99)
Glucose-Capillary: 204 mg/dL — ABNORMAL HIGH (ref 70–99)
Glucose-Capillary: 207 mg/dL — ABNORMAL HIGH (ref 70–99)
Glucose-Capillary: 233 mg/dL — ABNORMAL HIGH (ref 70–99)

## 2013-11-12 LAB — POCT I-STAT, CHEM 8
BUN: >140 mg/dL — ABNORMAL HIGH (ref 6–23)
CHLORIDE: 109 meq/L (ref 96–112)
Calcium, Ion: 1.48 mmol/L — ABNORMAL HIGH (ref 1.13–1.30)
Creatinine, Ser: 2.9 mg/dL — ABNORMAL HIGH (ref 0.50–1.35)
GLUCOSE: 169 mg/dL — AB (ref 70–99)
HEMATOCRIT: 39 % (ref 39.0–52.0)
HEMOGLOBIN: 13.3 g/dL (ref 13.0–17.0)
POTASSIUM: 7 meq/L — AB (ref 3.7–5.3)
SODIUM: 131 meq/L — AB (ref 137–147)
TCO2: 19 mmol/L (ref 0–100)

## 2013-11-12 LAB — PROCALCITONIN
Procalcitonin: 0.73 ng/mL
Procalcitonin: 0.74 ng/mL

## 2013-11-12 LAB — PROTIME-INR
INR: 1.3 (ref 0.00–1.49)
Prothrombin Time: 15.9 seconds — ABNORMAL HIGH (ref 11.6–15.2)

## 2013-11-12 LAB — COMPREHENSIVE METABOLIC PANEL
ALBUMIN: 3.2 g/dL — AB (ref 3.5–5.2)
ALT: 57 U/L — ABNORMAL HIGH (ref 0–53)
AST: 22 U/L (ref 0–37)
Alkaline Phosphatase: 54 U/L (ref 39–117)
BUN: 121 mg/dL — AB (ref 6–23)
CALCIUM: 10.9 mg/dL — AB (ref 8.4–10.5)
CO2: 16 mEq/L — ABNORMAL LOW (ref 19–32)
CREATININE: 2.77 mg/dL — AB (ref 0.50–1.35)
Chloride: 97 mEq/L (ref 96–112)
GFR calc Af Amer: 27 mL/min — ABNORMAL LOW (ref >90)
GFR calc non Af Amer: 23 mL/min — ABNORMAL LOW (ref >90)
GLUCOSE: 202 mg/dL — AB (ref 70–99)
Potassium: 6.8 mEq/L (ref 3.7–5.3)
Sodium: 127 mEq/L — ABNORMAL LOW (ref 137–147)
TOTAL PROTEIN: 8.9 g/dL — AB (ref 6.0–8.3)
Total Bilirubin: 0.6 mg/dL (ref 0.3–1.2)

## 2013-11-12 LAB — RENAL FUNCTION PANEL
ALBUMIN: 2.9 g/dL — AB (ref 3.5–5.2)
BUN: 113 mg/dL — ABNORMAL HIGH (ref 6–23)
CO2: 17 mEq/L — ABNORMAL LOW (ref 19–32)
Calcium: 9.8 mg/dL (ref 8.4–10.5)
Chloride: 98 mEq/L (ref 96–112)
Creatinine, Ser: 2.77 mg/dL — ABNORMAL HIGH (ref 0.50–1.35)
GFR calc Af Amer: 27 mL/min — ABNORMAL LOW (ref >90)
GFR calc non Af Amer: 23 mL/min — ABNORMAL LOW (ref >90)
Glucose, Bld: 221 mg/dL — ABNORMAL HIGH (ref 70–99)
PHOSPHORUS: 5.6 mg/dL — AB (ref 2.3–4.6)
POTASSIUM: 6.4 meq/L — AB (ref 3.7–5.3)
Sodium: 129 mEq/L — ABNORMAL LOW (ref 137–147)

## 2013-11-12 LAB — MRSA PCR SCREENING: MRSA by PCR: NEGATIVE

## 2013-11-12 LAB — POTASSIUM
Potassium: 7.2 mEq/L (ref 3.7–5.3)
Potassium: >7.7 mEq/L (ref 3.7–5.3)

## 2013-11-12 LAB — APTT: APTT: 37 s (ref 24–37)

## 2013-11-12 MED ORDER — ATORVASTATIN CALCIUM 40 MG PO TABS
40.0000 mg | ORAL_TABLET | Freq: Every day | ORAL | Status: DC
  Administered 2013-11-13 – 2013-11-17 (×5): 40 mg via ORAL
  Filled 2013-11-12 (×7): qty 1

## 2013-11-12 MED ORDER — PIPERACILLIN-TAZOBACTAM 3.375 G IVPB
3.3750 g | INTRAVENOUS | Status: AC
  Administered 2013-11-12: 3.375 g via INTRAVENOUS
  Filled 2013-11-12: qty 50

## 2013-11-12 MED ORDER — OXYCODONE HCL 5 MG PO TABS
5.0000 mg | ORAL_TABLET | Freq: Four times a day (QID) | ORAL | Status: DC | PRN
  Administered 2013-11-15: 5 mg via ORAL
  Filled 2013-11-12: qty 1

## 2013-11-12 MED ORDER — SODIUM CHLORIDE 0.9 % IV SOLN: INTRAVENOUS | Status: AC

## 2013-11-12 MED ORDER — OXYCODONE-ACETAMINOPHEN 5-325 MG PO TABS
1.0000 | ORAL_TABLET | Freq: Four times a day (QID) | ORAL | Status: DC | PRN
  Administered 2013-11-14 – 2013-11-15 (×3): 1 via ORAL
  Filled 2013-11-12 (×3): qty 1

## 2013-11-12 MED ORDER — INSULIN ASPART 100 UNIT/ML ~~LOC~~ SOLN
10.0000 [IU] | Freq: Once | SUBCUTANEOUS | Status: DC
  Filled 2013-11-12: qty 0.1

## 2013-11-12 MED ORDER — SODIUM CHLORIDE 0.9 % IJ SOLN
3.0000 mL | Freq: Two times a day (BID) | INTRAMUSCULAR | Status: DC
  Administered 2013-11-13: 3 mL via INTRAVENOUS

## 2013-11-12 MED ORDER — VANCOMYCIN HCL IN DEXTROSE 1-5 GM/200ML-% IV SOLN
1000.0000 mg | Freq: Once | INTRAVENOUS | Status: DC
  Filled 2013-11-12: qty 200

## 2013-11-12 MED ORDER — OXYCODONE-ACETAMINOPHEN 10-325 MG PO TABS: 1.0000 | ORAL_TABLET | Freq: Four times a day (QID) | ORAL | Status: DC | PRN

## 2013-11-12 MED ORDER — CARVEDILOL 6.25 MG PO TABS
6.2500 mg | ORAL_TABLET | Freq: Two times a day (BID) | ORAL | Status: DC
  Filled 2013-11-12 (×3): qty 1

## 2013-11-12 MED ORDER — SODIUM CHLORIDE 0.9 % IV BOLUS (SEPSIS)
1000.0000 mL | Freq: Once | INTRAVENOUS | Status: AC
  Administered 2013-11-12: 1000 mL via INTRAVENOUS

## 2013-11-12 MED ORDER — OXYCODONE HCL ER 20 MG PO T12A
20.0000 mg | EXTENDED_RELEASE_TABLET | Freq: Two times a day (BID) | ORAL | Status: DC
  Administered 2013-11-13 – 2013-11-18 (×10): 20 mg via ORAL
  Filled 2013-11-12 (×10): qty 1

## 2013-11-12 MED ORDER — ONDANSETRON HCL 4 MG/2ML IJ SOLN
4.0000 mg | Freq: Three times a day (TID) | INTRAMUSCULAR | Status: AC | PRN
Start: 2013-11-12 — End: 2013-11-12

## 2013-11-12 MED ORDER — SODIUM BICARBONATE 8.4 % IV SOLN
INTRAVENOUS | Status: DC
  Administered 2013-11-12 – 2013-11-13 (×3): via INTRAVENOUS
  Filled 2013-11-12 (×6): qty 150

## 2013-11-12 MED ORDER — DEXTROSE 50 % IV SOLN
50.0000 mL | Freq: Once | INTRAVENOUS | Status: AC
  Administered 2013-11-12: 50 mL via INTRAVENOUS
  Filled 2013-11-12: qty 50

## 2013-11-12 MED ORDER — INSULIN ASPART 100 UNIT/ML ~~LOC~~ SOLN
0.0000 [IU] | SUBCUTANEOUS | Status: DC
  Administered 2013-11-12: 1 [IU] via SUBCUTANEOUS
  Administered 2013-11-12: 2 [IU] via SUBCUTANEOUS
  Administered 2013-11-12 (×2): 3 [IU] via SUBCUTANEOUS
  Administered 2013-11-13: 2 [IU] via SUBCUTANEOUS
  Administered 2013-11-13: 3 [IU] via SUBCUTANEOUS
  Administered 2013-11-13: 2 [IU] via SUBCUTANEOUS
  Administered 2013-11-13: 3 [IU] via SUBCUTANEOUS

## 2013-11-12 MED ORDER — INSULIN ASPART 100 UNIT/ML ~~LOC~~ SOLN
10.0000 [IU] | Freq: Once | SUBCUTANEOUS | Status: AC
  Administered 2013-11-12: 10 [IU] via INTRAVENOUS
  Filled 2013-11-12: qty 1

## 2013-11-12 MED ORDER — SODIUM POLYSTYRENE SULFONATE 15 GM/60ML PO SUSP
30.0000 g | Freq: Once | ORAL | Status: AC
  Administered 2013-11-12: 30 g via ORAL
  Filled 2013-11-12: qty 120

## 2013-11-12 MED ORDER — SODIUM POLYSTYRENE SULFONATE 15 GM/60ML PO SUSP
45.0000 g | Freq: Once | ORAL | Status: AC
  Administered 2013-11-12: 45 g via ORAL
  Filled 2013-11-12: qty 180

## 2013-11-12 MED ORDER — CARVEDILOL 3.125 MG PO TABS
3.1250 mg | ORAL_TABLET | Freq: Two times a day (BID) | ORAL | Status: DC
  Administered 2013-11-13 – 2013-11-14 (×2): 3.125 mg via ORAL
  Filled 2013-11-12 (×6): qty 1

## 2013-11-12 MED ORDER — IOHEXOL 300 MG/ML  SOLN
50.0000 mL | Freq: Once | INTRAMUSCULAR | Status: AC | PRN
  Administered 2013-11-12: 50 mL via ORAL

## 2013-11-12 MED ORDER — CALCIUM GLUCONATE 10 % IV SOLN
1.0000 g | Freq: Once | INTRAVENOUS | Status: AC
  Administered 2013-11-12: 1 g via INTRAVENOUS
  Filled 2013-11-12: qty 10

## 2013-11-12 MED ORDER — VANCOMYCIN HCL 10 G IV SOLR
2250.0000 mg | Freq: Once | INTRAVENOUS | Status: AC
  Administered 2013-11-12: 2250 mg via INTRAVENOUS
  Filled 2013-11-12: qty 2250

## 2013-11-12 MED ORDER — SODIUM CHLORIDE 0.9 % IV SOLN
1.0000 g | Freq: Once | INTRAVENOUS | Status: AC
  Administered 2013-11-12: 1 g via INTRAVENOUS
  Filled 2013-11-12: qty 10

## 2013-11-12 MED ORDER — INSULIN ASPART 100 UNIT/ML IV SOLN
10.0000 [IU] | Freq: Once | INTRAVENOUS | Status: AC
  Administered 2013-11-12: 10 [IU] via INTRAVENOUS
  Filled 2013-11-12: qty 0.1

## 2013-11-12 MED ORDER — HEPARIN SODIUM (PORCINE) 5000 UNIT/ML IJ SOLN
5000.0000 [IU] | Freq: Three times a day (TID) | INTRAMUSCULAR | Status: DC
  Administered 2013-11-12 – 2013-11-18 (×20): 5000 [IU] via SUBCUTANEOUS
  Filled 2013-11-12 (×22): qty 1

## 2013-11-12 MED ORDER — DEXTROSE 50 % IV SOLN
1.0000 | Freq: Once | INTRAVENOUS | Status: AC
  Administered 2013-11-12: 50 mL via INTRAVENOUS
  Filled 2013-11-12: qty 50

## 2013-11-12 MED ORDER — PIPERACILLIN-TAZOBACTAM 3.375 G IVPB
3.3750 g | Freq: Three times a day (TID) | INTRAVENOUS | Status: DC
  Administered 2013-11-12 – 2013-11-15 (×9): 3.375 g via INTRAVENOUS
  Filled 2013-11-12 (×11): qty 50

## 2013-11-12 MED ORDER — VANCOMYCIN HCL 10 G IV SOLR
2000.0000 mg | INTRAVENOUS | Status: DC
  Administered 2013-11-13 – 2013-11-14 (×2): 2000 mg via INTRAVENOUS
  Filled 2013-11-12 (×3): qty 2000

## 2013-11-12 MED ORDER — SODIUM BICARBONATE 4 % IV SOLN
5.0000 mL | Freq: Once | INTRAVENOUS | Status: AC
  Administered 2013-11-12: 5 mL via INTRAVENOUS
  Filled 2013-11-12: qty 5

## 2013-11-12 NOTE — ED Notes (Signed)
I-Stat Chem 8 results given to [name].

## 2013-11-12 NOTE — ED Notes (Signed)
Notified RN, Raquel Sarna pt. Blood pressure decreased to 76/38.

## 2013-11-12 NOTE — Consult Note (Signed)
Referring Provider: No ref. provider found Primary Care Physician:  [name], MD Primary Nephrologist:   Reason for Consultation:  Hyperkalemia and metabolic acidosis and acute kidney injury HPI: Kurt Cross is a 63 y.o. male has a history recent diagnosis of a very large abdominal wall abscess that required a significant surgical debridement and a prolonged wound healing course and that has left him on total parenteral nutrition, and debilitated at home at the patient's request instead of in a nursing facility. His wife states that he is on continuous TPN, over the last week he has had several blood draws which have shown a gradually rising potassium level and renal dysfunction though the wife does not know the exact numbers. She was told to bring him to the hospital tonight because of his elevated potassium which was reportedly over 7, he has been difficult to arouse over the last 2 days and hasn't been somnolent or lethargic. There has been no fevers, no vomiting, no coughing, no focal complaints though he has been generally weak and unable to get out of bed. She has been caring for his enterocutaneous fistula with ostomy bags which she has fastened in a very unique way because of the size of his wound. The patient is followed in the surgical clinic and is being closely watched by both his internist Dr. Maudie Mercury as well as his general surgeon Dr. Johney Maine. Per wife plans had been made for definitive surgical management evaluation in [place] on the 11th of this month.   Past Medical History  Diagnosis Date  . Sleep apnea   . Diabetes mellitus   . Hypertension   . Hyperlipidemia   . CHF (congestive heart failure)   . Hernia   . AAA (abdominal aortic aneurysm)/ 4.5 cm ascending per CT angio 08/01/13 08/12/2013  . 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 Constipation, chronic 08/03/2013    Past Surgical History  Procedure Laterality Date  .  Appendectomy    . Tonsillectomy    . Nasal septum surgery    . Bowel resection  1980s?    ?colectomy/ostomy - Dr Elesa Hacker  . Ostomy take down  1980s?    Dr Elesa Hacker  . Incisional hernia repair  1980s?    Dr. Lindon Romp - w mesh  . Knee surgery Right   . Incision and drainage abscess N/A 08/14/2013    Procedure: INCISION AND DRAINAGE ABSCESS, mesh ;  Surgeon: [name], MD;  Location: [facility];  Service: General;  Laterality: N/A;    Prior to Admission medications   Medication Sig Start Date End Date Taking? Authorizing Provider  ADULT TPN Inject into the vein continuous. Patient starts at 7pm-7am each night. Gets TPN at [place] with lipids 3058ml  Additives  70ml infuvite 60 units humulin R Infuse at 136ml/hr   Yes Historical Provider, MD  carvedilol (COREG) 6.25 MG tablet Take 6.25 mg by mouth 2 (two) times daily with a meal.   Yes Historical Provider, MD  furosemide (LASIX) 80 MG tablet Take 80 mg by mouth 2 (two) times daily.   Yes Historical Provider, MD  HUMULIN R 100 UNIT/ML injection Inject 60 Units into the vein See admin instructions. Takes in his TPN at 2045 10/15/13  Yes Historical Provider, MD  insulin aspart (NOVOLOG) 100 UNIT/ML injection Inject 12 Units into the skin 2 (two) times daily.   Yes Historical Provider, MD  insulin NPH Human (HUMULIN  N,NOVOLIN N) 100 UNIT/ML injection Inject 40 Units into the skin 2 (two) times daily before a meal.   Yes Historical Provider, MD  lisinopril (PRINIVIL,ZESTRIL) 10 MG tablet Take 10 mg by mouth 2 (two) times daily.  10/16/13  Yes Historical Provider, MD  loperamide (IMODIUM) 2 MG capsule Take 1 capsule (2 mg total) by mouth 2 (two) times daily. 09/10/13  Yes Janece Canterbury, MD  OxyCODONE (OXYCONTIN) 20 mg T12A 12 hr tablet Take 1 tablet (20 mg total) by mouth every 12 (twelve) hours. 10/15/13  Yes Theodis Blaze, MD  oxyCODONE-acetaminophen (PERCOCET) 10-325 MG per tablet Take 1 tablet by mouth every 6 (six) hours as  needed for pain.  11/04/13  Yes Historical Provider, MD  potassium chloride SA (K-DUR,KLOR-CON) 20 MEQ tablet Take 2 tablets (40 mEq total) by mouth daily. 09/10/13  Yes Janece Canterbury, MD  rosuvastatin (CRESTOR) 20 MG tablet Take 20 mg by mouth every other day. 08/05/13  Yes Robbie Lis, MD  spironolactone (ALDACTONE) 100 MG tablet Take 100 mg by mouth daily.   Yes Historical Provider, MD    Current Facility-Administered Medications  Medication Dose Route Frequency Provider Last Rate Last Dose  . 0.9 %  sodium chloride infusion  1,000 mL Intravenous Continuous Johnna Acosta, MD 125 mL/hr at 11/12/13 1300 1,000 mL at 11/12/13 1300  . 0.9 %  sodium chloride infusion   Intravenous STAT Johnna Acosta, MD      . atorvastatin (LIPITOR) tablet 40 mg  40 mg Oral q1800 [name], DO      . carvedilol (COREG) tablet 3.125 mg  3.125 mg Oral BID WC Adeline C Viyuoh, MD      . heparin injection 5,000 Units  5,000 Units Subcutaneous Q8H [name], DO   5,000 Units at 11/12/13 0608  . insulin aspart (novoLOG) injection 0-9 Units  0-9 Units Subcutaneous Q4H [name], DO   2 Units at 11/12/13 1257  . ondansetron (ZOFRAN) injection 4 mg  4 mg Intravenous Q8H PRN Johnna Acosta, MD      . oxyCODONE-acetaminophen (PERCOCET/ROXICET) 5-325 MG per tablet 1 tablet  1 tablet Oral Q6H PRN [name], DO       And  . oxyCODONE (Oxy IR/ROXICODONE) immediate release tablet 5 mg  5 mg Oral Q6H PRN [name], DO      . OxyCODONE (OXYCONTIN) 12 hr tablet 20 mg  20 mg Oral Q12H Jared M Gardner, DO      . piperacillin-tazobactam (ZOSYN) IVPB 3.375 g  3.375 g Intravenous Q8H [name], DO      . sodium bicarbonate 150 mEq in dextrose 5 % 1,000 mL infusion   Intravenous Continuous William S Minor, NP      . sodium chloride 0.9 % injection 3 mL  3 mL Intravenous Q12H [name], DO      . [START ON 11/13/2013] vancomycin (VANCOCIN) 2,000 mg in sodium chloride 0.9 % 500 mL IVPB  2,000 mg  Intravenous Q24H [name], DO        Allergies as of 11/11/2013  . (No Known Allergies)    No family history on file.  History   Social History  . Marital Status: Married    Spouse Name: N/A    Number of Children: N/A  . Years of Education: N/A   Occupational History  . Not on file.   Social History Main Topics  . Smoking status: Former  Smoker -- 1.00 packs/day for 40 years    Types: Cigarettes    Quit date: 08/21/2012  . Smokeless tobacco: Never Used  . Alcohol Use: No  . Drug Use: No  . Sexual Activity: Not on file   Other Topics Concern  . Not on file   Social History Narrative  . No narrative on file    Review of Systems:  Patient is non responsive although resting comfortably  Physical Exam: Vital signs in last 24 hours: Temp:  [97.7 F (36.5 C)-100.6 F (38.1 C)] 97.7 F (36.5 C) (02/06 0700) Pulse Rate:  [59-84] 69 (02/06 1300) Resp:  [14-24] 18 (02/06 1300) BP: (87-125)/(33-96) 91/55 mmHg (02/06 1300) SpO2:  [84 %-100 %] 97 % (02/06 1300) Weight:  [167 kg (368 lb 2.7 oz)-188 kg (414 lb 7.4 oz)] 167 kg (368 lb 2.7 oz) (02/06 0659) Last BM Date:  (colostomy ) General:  Morbid obese Head:  Normocephalic and atraumatic. Eyes:  Sclera clear, no icterus.   Conjunctiva pink. Ears:  Normal auditory acuity. Nose:  No deformity, discharge,  or lesions. Mouth:  No deformity or lesions, dentition normal. Neck:  Supple; no masses or thyromegaly. JVP not elevated  Thick neck Lungs:  Clear throughout to auscultation.   No wheezes, crackles, or rhonchi. No acute distress. Heart:  Regular rate and rhythm; no murmurs, clicks, rubs,  or gallops. Abdomen: Large fatty pannus Msk:  Symmetrical without gross deformities. Normal posture. Pulses:  No carotid, renal, femoral bruits. DP and PT symmetrical and equal Extremities:  Without clubbing or edema. Neurologic:  Alert and  oriented x4;  grossly normal neurologically.   Intake/Output from previous  day: 02/05 0701 - 02/06 0700 In: -  Out: 400 [Stool:400] Intake/Output this shift: Total I/O In: 180 [P.O.:180] Out: 1050 [Stool:1050]  Lab Results:  Recent Labs  11/11/13 2334 11/11/13 2346 11/12/13 0149  WBC 16.1*  --   --   HGB 13.4 15.3 13.3  HCT 39.5 45.0 39.0  PLT 277  --   --    BMET  Recent Labs  11/11/13 2334 11/11/13 2346 11/12/13 0149 11/12/13 0257 11/12/13 0715 11/12/13 1034  NA 127* 130* 131* 129*  --   --   K 6.8* 6.8* 7.0* 6.4* 7.2* >7.7*  CL 97 106 109 101  --   --   CO2 16*  --   --  14*  --   --   GLUCOSE 202* 210* 169* 154*  --   --   BUN 121* 137* >140* 118*  --   --   CREATININE 2.77* 2.80* 2.90* 2.91*  --   --   CALCIUM 10.9*  --   --  10.5  --   --    LFT  Recent Labs  11/11/13 2334  PROT 8.9*  ALBUMIN 3.2*  AST 22  ALT 57*  ALKPHOS 54  BILITOT 0.6   PT/INR  Recent Labs  11/11/13 2334  LABPROT 15.9*  INR 1.30   Hepatitis Panel No results found for this basename: HEPBSAG, HCVAB, HEPAIGM, HEPBIGM,  in the last 72 hours  Studies/Results: Ct Abdomen Pelvis Wo Contrast  11/12/2013   CLINICAL DATA:  Evaluate for recurrence of abscess  EXAM: CT ABDOMEN AND PELVIS WITHOUT CONTRAST  TECHNIQUE: Multidetector CT imaging of the abdomen and pelvis was performed following the standard protocol without intravenous contrast.  COMPARISON:  08/14/2013  FINDINGS: BODY WALL: There is an ostomy in the right lower quadrant. Ostomy defect is relatively large in the  anterior abdominal wall, status post mesh removal and abscess drainage. There is no fluid collection currently. 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 Contrast does not reach these segments of bowel to confirm the persistence of fistula. There is a distinct left lower quadrant ventral wall hernia which contains a knuckle of nonobstructed sigmoid colon. Bilateral fatty inguinal hernias.  LOWER CHEST:  Unremarkable.  ABDOMEN/PELVIS:  Liver: No focal abnormality.  Biliary: Cholelithiasis. No gallbladder wall thickening or pericholecystic edema.  Pancreas: Unremarkable.  Spleen: Unremarkable.  Adrenals: Large, lobulated adrenal glands, likely from multiple adenomas.  Kidneys and ureters: No hydronephrosis or stone.  Bladder: Contains urine.  Reproductive: Unremarkable.  Bowel: No bowel obstruction.  Stoma and neighboring findings above.  Retroperitoneum: No mass or adenopathy.  Peritoneum: No free fluid or gas.  Vascular: No acute abnormality.  OSSEOUS: No acute abnormalities. Stable appearance of the degenerated symphysis pubis and SI joints. Bulky spondylosis in the lower thoracic spine.  IMPRESSION: 1. No recurrence of previously seen abdominal wall hernia. No intra-abdominal source of sepsis identified. 2. No hydronephrosis to explain renal failure. 3. Cholelithiasis.   Electronically Signed   By: Jorje Guild M.D.   On: 11/12/2013 04:32   Dg Chest Port 1 View  11/12/2013   CLINICAL DATA:  Status post central line placement  EXAM: PORTABLE CHEST - 1 VIEW  COMPARISON:  11/12/2013  FINDINGS: Cardiac shadow is stable. A pacing device is again seen. A new right-sided central venous line is seen with the catheter tip in the mid superior vena cava. No pneumothorax is noted. The previously seen right-sided PICC line is been removed in the interval. The lungs remain clear.  IMPRESSION: Status post central line placement as described. No pneumothorax is noted.   Electronically Signed   By: Inez Catalina M.D.   On: 11/12/2013 12:37   Dg Chest Port 1 View  11/12/2013   CLINICAL DATA:  Weakness and body aches.  EXAM: PORTABLE CHEST - 1 VIEW  COMPARISON:  Single view of the chest 10/15/2013.  FINDINGS: Three-view pacing device in place. Right PICC is again seen. Lungs are clear. Heart size is mildly enlarged. No pneumothorax or pleural effusion.  IMPRESSION: No acute disease.   Electronically Signed   By: Inge Rise M.D.   On: 11/12/2013 00:10    Assessment/Plan: 1.Acute Kidney injury with severe sepsis with acute organ dysfunction with what appears to be massive volume depletion and use of ACE Potassium supplementation and Spirinolactone  It appears that despite the K being measured on repeat of > 7.7 measures have already been taken to decrease the K with kayexalate , bicarbonate and calcium.  There is a repeat potassium to be drawn in 15 minutes and depending on the results we shall either continue to manage medically or transfer to patient to [facility] hospital for dialysis  2.Metabolic Acidosis    IV D5 W with 3 Amps of bicarbonate @ 125 cc/hr  3.Hyperkalemia  Treat with Kayexalate and insulin   4. Severe sepsis with acute organ dysfunction     LOS: 1 Adria Costley W @TODAY @1 :37 PM

## 2013-11-12 NOTE — Progress Notes (Signed)
CRITICAL VALUE ALERT  Critical value received:  K 7.2  Date of notification:  11/12/13  Time of notification:  0746  Critical value read back: yes  Nurse who received alert:  Geannie Risen, RN  MD notified (1st page):  Dr. Inis Sizer  Time of first page:  0800  MD notified (2nd page):  Time of second page:  Responding MD:  Dr. Inis Sizer  Time MD responded:  [phone]

## 2013-11-12 NOTE — H&P (Signed)
[facility] History and Physical  BREKIN ASEL [id number] DOB: 1951/06/22 DOA: 11/11/2013  Referring physician: EDP PCP: [name], MD   Chief Complaint: Generalized weakness   HPI: Kurt Cross is a 63 y.o. male has a history recent diagnosis of a very large abdominal wall abscess that required a significant surgical debridement and a prolonged wound healing course and that has left him on total parenteral nutrition, and debilitated at home at the patient's request instead of in a nursing facility. His wife states that he is on continuous TPN, over the last week he has had several blood draws which have shown a gradually rising potassium level and renal dysfunction though the wife does not know the exact numbers. She was told to bring him to the hospital tonight because of his elevated potassium which was reportedly over 7, he has been difficult to arouse over the last 2 days and hasn't been somnolent or lethargic. There has been no fevers, no vomiting, no coughing, no focal complaints though he has been generally weak and unable to get out of bed. She has been caring for his enterocutaneous fistula with ostomy bags which she has fastened in a very unique way because of the size of his wound. The patient is followed in the surgical clinic and is being closely watched by both his internist Dr. Maudie Mercury as well as his general surgeon Dr. Johney Maine.  Per wife plans had been made for definitive surgical management evaluation in [place] on the 11th of this month.  In the ED, potassium was initially 6.8.  He was initially hypotensive in the 80s, BP slightly improved with IVF.  Wife DOES report decreased ostomy output and worsening abdominal distention.  Patient also reports 1 week history of pain at his PICC line site such that he cannot lie with his arm at his side.   Review of Systems: Systems reviewed.  As above, otherwise negative  Past Medical History  Diagnosis Date  . Sleep apnea   .  Diabetes mellitus   . Hypertension   . Hyperlipidemia   . CHF (congestive heart failure)   . Hernia   . AAA (abdominal aortic aneurysm)/ 4.5 cm ascending per CT angio 08/01/13 08/12/2013  . 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 Constipation, chronic 08/03/2013   Past Surgical History  Procedure Laterality Date  . Appendectomy    . Tonsillectomy    . Nasal septum surgery    . Bowel resection  1980s?    ?colectomy/ostomy - Dr Elesa Hacker  . Ostomy take down  1980s?    Dr Elesa Hacker  . Incisional hernia repair  1980s?    Dr. Lindon Romp - w mesh  . Knee surgery Right   . Incision and drainage abscess N/A 08/14/2013    Procedure: INCISION AND DRAINAGE ABSCESS, mesh ;  Surgeon: [name], MD;  Location: [facility];  Service: General;  Laterality: N/A;   Social History:  reports that he quit smoking about 14 months ago. His smoking use included Cigarettes. He has a 40 pack-year smoking history. He has never used smokeless tobacco. He reports that he does not drink alcohol or use illicit drugs.  No Known Allergies  No family history on file.   Prior to Admission medications   Medication Sig Start Date End Date Taking? Authorizing Provider  ADULT TPN Inject into the vein continuous. Patient starts at 7pm-7am each night. Gets TPN at [place]  TPN with lipids 3024ml  Additives  17ml infuvite 60 units humulin R Infuse at 132ml/hr   Yes Historical Provider, MD  carvedilol (COREG) 6.25 MG tablet Take 6.25 mg by mouth 2 (two) times daily with a meal.   Yes Historical Provider, MD  furosemide (LASIX) 80 MG tablet Take 80 mg by mouth 2 (two) times daily.   Yes Historical Provider, MD  HUMULIN R 100 UNIT/ML injection Inject 60 Units into the vein See admin instructions. Takes in his TPN at 2045 10/15/13  Yes Historical Provider, MD  insulin aspart (NOVOLOG) 100 UNIT/ML injection Inject 12 Units into the skin 2 (two) times daily.   Yes  Historical Provider, MD  insulin NPH Human (HUMULIN N,NOVOLIN N) 100 UNIT/ML injection Inject 40 Units into the skin 2 (two) times daily before a meal.   Yes Historical Provider, MD  lisinopril (PRINIVIL,ZESTRIL) 10 MG tablet Take 10 mg by mouth 2 (two) times daily.  10/16/13  Yes Historical Provider, MD  loperamide (IMODIUM) 2 MG capsule Take 1 capsule (2 mg total) by mouth 2 (two) times daily. 09/10/13  Yes Janece Canterbury, MD  OxyCODONE (OXYCONTIN) 20 mg T12A 12 hr tablet Take 1 tablet (20 mg total) by mouth every 12 (twelve) hours. 10/15/13  Yes Theodis Blaze, MD  oxyCODONE-acetaminophen (PERCOCET) 10-325 MG per tablet Take 1 tablet by mouth every 6 (six) hours as needed for pain.  11/04/13  Yes Historical Provider, MD  potassium chloride SA (K-DUR,KLOR-CON) 20 MEQ tablet Take 2 tablets (40 mEq total) by mouth daily. 09/10/13  Yes Janece Canterbury, MD  rosuvastatin (CRESTOR) 20 MG tablet Take 20 mg by mouth every other day. 08/05/13  Yes Robbie Lis, MD  spironolactone (ALDACTONE) 100 MG tablet Take 100 mg by mouth daily.   Yes Historical Provider, MD   Physical Exam: Filed Vitals:   11/12/13 0130  BP: 100/48  Pulse: 67  Temp:   Resp: 23    BP 100/48  Pulse 67  Temp(Src) 100.6 F (38.1 C) (Rectal)  Resp 23  SpO2 96%  General Appearance:    Alert, oriented, no distress, appears stated age  Head:    Normocephalic, atraumatic  Eyes:    PERRL, EOMI, sclera non-icteric        Nose:   Nares without drainage or epistaxis. Mucosa, turbinates normal  Throat:   Moist mucous membranes. Oropharynx without erythema or exudate.  Neck:   Supple. No carotid bruits.  No thyromegaly.  No lymphadenopathy.   Back:     No CVA tenderness, no spinal tenderness  Lungs:     Clear to auscultation bilaterally, without wheezes, rhonchi or rales  Chest wall:    No tenderness to palpitation  Heart:    Regular rate and rhythm without murmurs, gallops, rubs  Abdomen:     Soft, mild tenderness, distended, ostomy  site in place with brownish output.  Genitalia:    deferred  Rectal:    deferred  Extremities:   No clubbing, cyanosis or edema.  Pulses:   2+ and symmetric all extremities  Skin:   Skin color, texture, turgor normal, no rashes or lesions  Lymph nodes:   Cervical, supraclavicular, and axillary nodes normal  Neurologic:   CNII-XII intact. Normal strength, sensation and reflexes      throughout    Labs on Admission:  Basic Metabolic Panel:  Recent Labs Lab 11/11/13 2334 11/11/13 2346 11/12/13 0149  NA 127* 130* 131*  K 6.8* 6.8* 7.0*  CL 97 106  109  CO2 16*  --   --   GLUCOSE 202* 210* 169*  BUN 121* 137* >140*  CREATININE 2.77* 2.80* 2.90*  CALCIUM 10.9*  --   --    Liver Function Tests:  Recent Labs Lab 11/11/13 2334  AST 22  ALT 57*  ALKPHOS 54  BILITOT 0.6  PROT 8.9*  ALBUMIN 3.2*   No results found for this basename: LIPASE, AMYLASE,  in the last 168 hours No results found for this basename: AMMONIA,  in the last 168 hours CBC:  Recent Labs Lab 11/11/13 2334 11/11/13 2346 11/12/13 0149  WBC 16.1*  --   --   NEUTROABS 11.8*  --   --   HGB 13.4 15.3 13.3  HCT 39.5 45.0 39.0  MCV 90.2  --   --   PLT 277  --   --    Cardiac Enzymes: No results found for this basename: CKTOTAL, CKMB, CKMBINDEX, TROPONINI,  in the last 168 hours  BNP (last 3 results)  Recent Labs  08/01/13 1649  PROBNP 481.4*   CBG: No results found for this basename: GLUCAP,  in the last 168 hours  Radiological Exams on Admission: Dg Chest Port 1 View  11/12/2013   CLINICAL DATA:  Weakness and body aches.  EXAM: PORTABLE CHEST - 1 VIEW  COMPARISON:  Single view of the chest 10/15/2013.  FINDINGS: Three-view pacing device in place. Right PICC is again seen. Lungs are clear. Heart size is mildly enlarged. No pneumothorax or pleural effusion.  IMPRESSION: No acute disease.   Electronically Signed   By: Inge Rise M.D.   On: 11/12/2013 00:10    EKG: Independently  reviewed.  Assessment/Plan Principal Problem:   Severe sepsis with acute organ dysfunction Active Problems:   Diabetes mellitus, insulin dependent (IDDM), uncontrolled   Infected prosthetic mesh of abdominal wall with GIANT abscess s/p removal 08/14/2013   Hyperkalemia   AKI (acute kidney injury)   1. Severe sepsis with acute organ dysfunction - 2 possible sites of infection include PICC line infection and redevelopment of abdominal abscess from his EC fistula.  On Zosyn, Vanc, getting bolused with IVF, CT abd/pelvis to eval fistula site and for redevelopment of abscess (if abscess re-developed [facility] are his surgeons, consult them), removing PICC line and culturing tip (will need peripheral access in mean time, will likely need another line placed to go back on TPN in the next day or two).  Blood cultures pending. 2. AKI - pre-renal due to sepsis, IVF with MAP goal > 65, currently SBP = 100, strict intake and output.  Hold diuretics and ACEi 3. Hyperkalemia - likely due to combination of meds and AKF, holding all hyperkalemia causing meds (lisinopril, spironolactone, po potassium).  Repeat potassium pending in ED right now, initial repeat I-stat chem 8 showed no improvement with initial temporary measures.  If this is confirmed that his potassium has actually increased despite temporary measures, then will need to consult [facility] urgently for possible [facility] (i doubt his BP will handle HD at this time) discussed this with patient and wife. 4. DM2 - low dose SSI q4h since he is NPO and TPN being stopped right now due to PICC line being removed for suspicion of possible line sepsis    Code Status: Full Code  Family Communication: Wife at bedside Disposition Plan: Admit to SDU   Time spent: 70 min  Mckinnley Cottier M. [facility] Pager [phone]  If 7AM-7PM, please contact  the day team taking care of the patient [email or web address] Password [email or web address] 11/12/2013, 2:07 AM

## 2013-11-12 NOTE — Progress Notes (Signed)
[facility]  Patient Status: Active pt with [facility] up to this admission  [facility] is providing the following services: HHRN, PT, OT, Home health aide and home infusion pharmacy services for home TNA.  [facility] hospital team will follow and support transition back home when deemed appropriate.    If patient discharges after hours, please call [phone].   Larry Sierras 11/12/2013, 8:59 AM

## 2013-11-12 NOTE — Procedures (Signed)
Central Venous Catheter Insertion Procedure Note Kurt Cross [id number] 22-Jun-1951  Procedure: Insertion of Central Venous Catheter Indications: Assessment of intravascular volume, Drug and/or fluid administration and Frequent blood sampling  Procedure Details Consent: Risks of procedure as well as the alternatives and risks of each were explained to the (patient/caregiver).  Consent for procedure obtained. Time Out: Verified patient identification, verified procedure, site/side was marked, verified correct patient position, special equipment/implants available, medications/allergies/relevent history reviewed, required imaging and test results available.  Performed  Maximum sterile technique was used including antiseptics, cap, gloves, gown, hand hygiene, mask and sheet. Skin prep: Chlorhexidine; local anesthetic administered A antimicrobial bonded/coated triple lumen catheter was placed in the right internal jugular vein using the Seldinger technique. Ultrasound guidance used.yes Catheter placed to 18 cm. Blood aspirated via all 3 ports and then flushed x 3. Line sutured x 2 and dressing applied.  Evaluation Blood flow good Complications: No apparent complications Patient did tolerate procedure well. Chest X-[name] ordered to verify placement.  CXR: pending.  [name] Minor ACNP [facility] PCCM Pager [phone] till 3 pm If no answer page [phone] 11/12/2013, 12:18 PM  Baltazar Apo, MD, PhD 11/12/2013, 12:24 PM  Pulmonary and Critical Care [phone] or if no answer [phone]

## 2013-11-12 NOTE — Progress Notes (Signed)
[facility] Home TPN Formula attached  Patient Status: Active pt with [facility] up to this Admission  [facility] is providing the following services:  HHRN, OT, PT, [place] team.  If patient discharges after hours, please call [phone].   Kurt Cross 11/12/2013, 11:04 AM

## 2013-11-12 NOTE — ED Notes (Addendum)
Pt. Made aware for the need of urine. 

## 2013-11-12 NOTE — Progress Notes (Addendum)
[facility] PROGRESS NOTE  DIOGENES UDELHOFEN [id number] DOB: [birth year] DOA: 11/11/2013 PCP: [name], MD  Asessment/Plan: sepsis syndrome -S/P PICC line removal in ED -Hypotension resolving s/p IVF -continue empiric Vanc and Zosyn pending cultures -CCM consuled for further recs -surgery consulted for further recs re:  abd wound -will decrease coreg and only administer as BP tolerates ARF with Hyperkalemia and metab acidosis -multifactorial- was on lasix, spironolactone, kcl supplements, ACEI and hypotensive on admit -give kayexalate now, he has no IV access>> CCM to assist with CVL, and then will further manage accordingly once IV access obtained -above listed med dc'ed at this time -renal consult for possible emergent dialysis Active Problems:  Diabetes mellitus, insulin dependent (IDDM), uncontrolled  -continue SSI for now Complicated abd wall abscess /Infected prosthetic mesh s/p I&D and removal 08/21/2013  -surgery consulted for further recs -was on TNA, but PICC now removed as above, follow and resume as appropriate chronic systolic CHF, status post AICD -monitor fluid status closely off diuretics d/t ARF, hypotension as above -coreg only as BP tolerates -follow and resume/further manage accordingly pending clinical course OSA      Code Status: full Family Communication: wife at bedside Disposition Plan: keep in Step down   Consultants:  CCM  Renal  CCS  Procedures:  none  Antibiotics:  Vanc started on 2/5  Zosyn started on 2/2  HPI/Subjective: Pt drowsy appearing but easily aroused and oriented x3, denies N/V, SOB and no CP  Objective: Filed Vitals:   11/12/13 0900  BP: 103/65  Pulse: 60  Temp:   Resp: 17    Intake/Output Summary (Last 24 hours) at 11/12/13 0949 Last data filed at 11/12/13 0900  Gross per 24 hour  Intake    180 ml  Output    400 ml  Net   -220 ml   Filed Weights   11/12/13 0500 11/12/13 0659  Weight: 188 kg (414  lb 7.4 oz) 167 kg (368 lb 2.7 oz)    Exam:  General: easily aroused & oriented x 3 In NAD Cardiovascular: RRR, nl S1 s2 Respiratory: CTAB Abdomen: soft +BS mild tenderness/ND, no rebound, large supraumbilical wound clean base, dressing soaked with serous drainage. Brownish drainage infra umbilically, no masses palpable Extremities: No cyanosis and no edema    Data Reviewed: Basic Metabolic Panel:  Recent Labs Lab 11/11/13 2334 11/11/13 2346 11/12/13 0149 11/12/13 0257 11/12/13 0715  NA 127* 130* 131* 129*  --   K 6.8* 6.8* 7.0* 6.4* 7.2*  CL 97 106 109 101  --   CO2 16*  --   --  14*  --   GLUCOSE 202* 210* 169* 154*  --   BUN 121* 137* >140* 118*  --   CREATININE 2.77* 2.80* 2.90* 2.91*  --   CALCIUM 10.9*  --   --  10.5  --    Liver Function Tests:  Recent Labs Lab 11/11/13 2334  AST 22  ALT 57*  ALKPHOS 54  BILITOT 0.6  PROT 8.9*  ALBUMIN 3.2*   No results found for this basename: LIPASE, AMYLASE,  in the last 168 hours No results found for this basename: AMMONIA,  in the last 168 hours CBC:  Recent Labs Lab 11/11/13 2334 11/11/13 2346 11/12/13 0149  WBC 16.1*  --   --   NEUTROABS 11.8*  --   --   HGB 13.4 15.3 13.3  HCT 39.5 45.0 39.0  MCV 90.2  --   --   PLT 277  --   --  Cardiac Enzymes: No results found for this basename: CKTOTAL, CKMB, CKMBINDEX, TROPONINI,  in the last 168 hours BNP (last 3 results)  Recent Labs  08/01/13 1649  PROBNP 481.4*   CBG:  Recent Labs Lab 11/12/13 0359 11/12/13 0811  GLUCAP 156* 135*    Recent Results (from the past 240 hour(s))  MRSA PCR SCREENING     Status: None   Collection Time    11/12/13  5:34 AM      Result Value Range Status   MRSA by PCR NEGATIVE  NEGATIVE Final   Comment:            The GeneXpert MRSA Assay (FDA     approved for NASAL specimens     only), is one component of a     comprehensive MRSA colonization     surveillance program. It is not     intended to diagnose MRSA      infection nor to guide or     monitor treatment for     MRSA infections.     Studies: Ct Abdomen Pelvis Wo Contrast  11/12/2013   CLINICAL DATA:  Evaluate for recurrence of abscess  EXAM: CT ABDOMEN AND PELVIS WITHOUT CONTRAST  TECHNIQUE: Multidetector CT imaging of the abdomen and pelvis was performed following the standard protocol without intravenous contrast.  COMPARISON:  08/14/2013  FINDINGS: BODY WALL: There is an ostomy in the right lower quadrant. Ostomy defect is relatively large in the anterior abdominal wall, status post mesh removal and abscess drainage. There is no fluid collection currently. 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 Contrast does not reach these segments of bowel to confirm the persistence of fistula. There is a distinct left lower quadrant ventral wall hernia which contains a knuckle of nonobstructed sigmoid colon. Bilateral fatty inguinal hernias.  LOWER CHEST: Unremarkable.  ABDOMEN/PELVIS:  Liver: No focal abnormality.  Biliary: Cholelithiasis. No gallbladder wall thickening or pericholecystic edema.  Pancreas: Unremarkable.  Spleen: Unremarkable.  Adrenals: Large, lobulated adrenal glands, likely from multiple adenomas.  Kidneys and ureters: No hydronephrosis or stone.  Bladder: Contains urine.  Reproductive: Unremarkable.  Bowel: No bowel obstruction.  Stoma and neighboring findings above.  Retroperitoneum: No mass or adenopathy.  Peritoneum: No free fluid or gas.  Vascular: No acute abnormality.  OSSEOUS: No acute abnormalities. Stable appearance of the degenerated symphysis pubis and SI joints. Bulky spondylosis in the lower thoracic spine.  IMPRESSION: 1. No recurrence of previously seen abdominal wall hernia. No intra-abdominal source of sepsis identified. 2. No hydronephrosis to explain renal failure. 3. Cholelithiasis.   Electronically Signed   By: Jorje Guild M.D.   On:  11/12/2013 04:32   Dg Chest Port 1 View  11/12/2013   CLINICAL DATA:  Weakness and body aches.  EXAM: PORTABLE CHEST - 1 VIEW  COMPARISON:  Single view of the chest 10/15/2013.  FINDINGS: Three-view pacing device in place. Right PICC is again seen. Lungs are clear. Heart size is mildly enlarged. No pneumothorax or pleural effusion.  IMPRESSION: No acute disease.   Electronically Signed   By: Inge Rise M.D.   On: 11/12/2013 00:10    Scheduled Meds: . sodium chloride   Intravenous STAT  . atorvastatin  40 mg Oral q1800  . carvedilol  6.25 mg Oral BID WC  . heparin  5,000 Units Subcutaneous Q8H  . insulin aspart  0-9 Units Subcutaneous Q4H  . OxyCODONE  20 mg Oral Q12H  . piperacillin-tazobactam (ZOSYN)  IV  3.375 g Intravenous Q8H  . sodium chloride  3 mL Intravenous Q12H  . [START ON 11/13/2013] vancomycin  2,000 mg Intravenous Q24H   Continuous Infusions: . sodium chloride 1,000 mL (11/12/13 0440)    Principal Problem:   Severe sepsis with acute organ dysfunction Active Problems:   Diabetes mellitus, insulin dependent (IDDM), uncontrolled   Obesity, Class III, BMI 40-49.9 (morbid obesity)   OBSTRUCTIVE SLEEP APNEA   Infected prosthetic mesh of abdominal wall with GIANT abscess s/p removal 08/14/2013   Atrial fibrillation/flutter   Hyperkalemia   AKI (acute kidney injury)   Time spent:critical care time 54mins    Fahd Galea C  [facility] Pager [phone]. If 7PM-7AM, please contact night-coverage at [email or web address], password [facility] 11/12/2013, 9:49 AM  LOS: 1 day

## 2013-11-12 NOTE — Consult Note (Signed)
Name: Kurt Cross MRN: [id number] DOB: Nov 03, 1950    ADMISSION DATE:  11/11/2013 CONSULTATION DATE: 2/6  REFERRING MD : [facility] PRIMARY SERVICE: [facility]  CHIEF COMPLAINT: Abd pain  BRIEF PATIENT DESCRIPTION: MO, OSA not on CPAP, post abdominal abscess post bowel resection, colostomy, short gut syndrome admitted 2/6 with new hyperkalemia, renal failure, painful PICC with removal and no IV access.  SIGNIFICANT EVENTS / STUDIES:   LINES / TUBES: PICC R UE pre-admit >> 2/6 2/6 rt CVL>>  CULTURES: 2/5 bc x 2>> 2/6 picc tip cath>> 2/6 UC>>  ANTIBIOTICS: 2/6 vanc>> 2/6 zoysn>>  HISTORY OF PRESENT ILLNESS:   MO, OSA not on CPAP for last yr, CHF with AICD, post abdominal abscess with infected mesh and removal,  post bowel resection, colostomy, short gut syndrome. He was admitted 2/6 with new hyperkalemia, renal failure, painful RUE PICC with removal. Now with no IV access. He presented with pain in abd and low urine output, hyperkalemia. His lactic acid was .76 and procalcitonin .73. BP was soft but has improved with fluid. Bladder scan revealed 150 cc of urine. Due to multiple medical comorbidites and open wounds he has great potential to become critically ill therefore PCCM asked to assist in his care.   PAST MEDICAL HISTORY :  Past Medical History  Diagnosis Date  . Sleep apnea   . Diabetes mellitus   . Hypertension   . Hyperlipidemia   . CHF (congestive heart failure)   . Hernia   . AAA (abdominal aortic aneurysm)/ 4.5 cm ascending per CT angio 08/01/13 08/12/2013  . 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 Constipation, chronic 08/03/2013   Past Surgical History  Procedure Laterality Date  . Appendectomy    . Tonsillectomy    . Nasal septum surgery    . Bowel resection  1980s?    ?colectomy/ostomy - Dr Elesa Hacker  . Ostomy take down  1980s?    Dr Elesa Hacker  . Incisional hernia repair  1980s?    Dr. Lindon Romp - w  mesh  . Knee surgery Right   . Incision and drainage abscess N/A 08/14/2013    Procedure: INCISION AND DRAINAGE ABSCESS, mesh ;  Surgeon: [name], MD;  Location: [facility];  Service: General;  Laterality: N/A;   Prior to Admission medications   Medication Sig Start Date End Date Taking? Authorizing Provider  ADULT TPN Inject into the vein continuous. Patient starts at 7pm-7am each night. Gets TPN at [facility] with lipids 3046ml  Additives  63ml infuvite 60 units humulin R Infuse at 152ml/hr   Yes Historical Provider, MD  carvedilol (COREG) 6.25 MG tablet Take 6.25 mg by mouth 2 (two) times daily with a meal.   Yes Historical Provider, MD  furosemide (LASIX) 80 MG tablet Take 80 mg by mouth 2 (two) times daily.   Yes Historical Provider, MD  HUMULIN R 100 UNIT/ML injection Inject 60 Units into the vein See admin instructions. Takes in his TPN at 2045 10/15/13  Yes Historical Provider, MD  insulin aspart (NOVOLOG) 100 UNIT/ML injection Inject 12 Units into the skin 2 (two) times daily.   Yes Historical Provider, MD  insulin NPH Human (HUMULIN N,NOVOLIN N) 100 UNIT/ML injection Inject 40 Units into the skin 2 (two) times daily before a meal.   Yes Historical Provider, MD  lisinopril (PRINIVIL,ZESTRIL) 10 MG tablet Take 10 mg by mouth 2 (two) times daily.  10/16/13  Yes Historical Provider, MD  loperamide (IMODIUM) 2 MG capsule Take 1 capsule (2 mg total) by mouth 2 (two) times daily. 09/10/13  Yes Janece Canterbury, MD  OxyCODONE (OXYCONTIN) 20 mg T12A 12 hr tablet Take 1 tablet (20 mg total) by mouth every 12 (twelve) hours. 10/15/13  Yes Theodis Blaze, MD  oxyCODONE-acetaminophen (PERCOCET) 10-325 MG per tablet Take 1 tablet by mouth every 6 (six) hours as needed for pain.  11/04/13  Yes Historical Provider, MD  potassium chloride SA (K-DUR,KLOR-CON) 20 MEQ tablet Take 2 tablets (40 mEq total) by mouth daily. 09/10/13  Yes Janece Canterbury, MD  rosuvastatin (CRESTOR) 20 MG tablet Take 20  mg by mouth every other day. 08/05/13  Yes Robbie Lis, MD  spironolactone (ALDACTONE) 100 MG tablet Take 100 mg by mouth daily.   Yes Historical Provider, MD   No Known Allergies  FAMILY HISTORY:  No family history on file. SOCIAL HISTORY:  reports that he quit smoking about 14 months ago. His smoking use included Cigarettes. He has a 40 pack-year smoking history. He has never used smokeless tobacco. He reports that he does not drink alcohol or use illicit drugs.  REVIEW OF SYSTEMS:   10 point review of system taken, please see HPI for positives and negatives.  SUBJECTIVE:  Uncomfortable but in no distress  VITAL SIGNS: Temp:  [97.7 F (36.5 C)-100.6 F (38.1 C)] 97.7 F (36.5 C) (02/06 0700) Pulse Rate:  [59-84] 60 (02/06 0900) Resp:  [17-24] 17 (02/06 0900) BP: (87-116)/(33-96) 103/65 mmHg (02/06 0900) SpO2:  [84 %-100 %] 99 % (02/06 0900) Weight:  [368 lb 2.7 oz (167 kg)-414 lb 7.4 oz (188 kg)] 368 lb 2.7 oz (167 kg) (02/06 0659) HEMODYNAMICS:   VENTILATOR SETTINGS:   INTAKE / OUTPUT: Intake/Output     02/05 0701 - 02/06 0700 02/06 0701 - 02/07 0700   P.O.  180   Total Intake(mL/kg)  180 (1.1)   Stool 400    Total Output 400     Net -400 +180          PHYSICAL EXAMINATION: General:  MOAAM NAD at rest Neuro:  Intact HEENT: Multiple chins, no jvd/lan Cardiovascular:  HSR RRR Lungs:  diminished in bases Abdomen:  Obese, open midline incision, colostomy  Musculoskeletal:  intact Skin:  warm  LABS:  CBC  Recent Labs Lab 11/11/13 2334 11/11/13 2346 11/12/13 0149  WBC 16.1*  --   --   HGB 13.4 15.3 13.3  HCT 39.5 45.0 39.0  PLT 277  --   --    Coag's  Recent Labs Lab 11/11/13 2334  APTT 37  INR 1.30   BMET  Recent Labs Lab 11/11/13 2334 11/11/13 2346 11/12/13 0149 11/12/13 0257 11/12/13 0715  NA 127* 130* 131* 129*  --   K 6.8* 6.8* 7.0* 6.4* 7.2*  CL 97 106 109 101  --   CO2 16*  --   --  14*  --   BUN 121* 137* >140* 118*  --     CREATININE 2.77* 2.80* 2.90* 2.91*  --   GLUCOSE 202* 210* 169* 154*  --    Electrolytes  Recent Labs Lab 11/11/13 2334 11/12/13 0257  CALCIUM 10.9* 10.5   Sepsis Markers  Recent Labs Lab 11/11/13 2334 11/11/13 2346  LATICACIDVEN  --  0.76  PROCALCITON 0.73  --    ABG No results found for this basename: PHART, PCO2ART, PO2ART,  in the last 168 hours Liver Enzymes  Recent  Labs Lab 11/11/13 2334  AST 22  ALT 57*  ALKPHOS 54  BILITOT 0.6  ALBUMIN 3.2*   Cardiac Enzymes No results found for this basename: TROPONINI, PROBNP,  in the last 168 hours Glucose  Recent Labs Lab 11/12/13 0359 11/12/13 0811  GLUCAP 156* 135*    Imaging Ct Abdomen Pelvis Wo Contrast  11/12/2013   CLINICAL DATA:  Evaluate for recurrence of abscess  EXAM: CT ABDOMEN AND PELVIS WITHOUT CONTRAST  TECHNIQUE: Multidetector CT imaging of the abdomen and pelvis was performed following the standard protocol without intravenous contrast.  COMPARISON:  08/14/2013  FINDINGS: BODY WALL: There is an ostomy in the right lower quadrant. Ostomy defect is relatively large in the anterior abdominal wall, status post mesh removal and abscess drainage. There is no fluid collection currently. 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 Contrast does not reach these segments of bowel to confirm the persistence of fistula. There is a distinct left lower quadrant ventral wall hernia which contains a knuckle of nonobstructed sigmoid colon. Bilateral fatty inguinal hernias.  LOWER CHEST: Unremarkable.  ABDOMEN/PELVIS:  Liver: No focal abnormality.  Biliary: Cholelithiasis. No gallbladder wall thickening or pericholecystic edema.  Pancreas: Unremarkable.  Spleen: Unremarkable.  Adrenals: Large, lobulated adrenal glands, likely from multiple adenomas.  Kidneys and ureters: No hydronephrosis or stone.  Bladder: Contains urine.  Reproductive:  Unremarkable.  Bowel: No bowel obstruction.  Stoma and neighboring findings above.  Retroperitoneum: No mass or adenopathy.  Peritoneum: No free fluid or gas.  Vascular: No acute abnormality.  OSSEOUS: No acute abnormalities. Stable appearance of the degenerated symphysis pubis and SI joints. Bulky spondylosis in the lower thoracic spine.  IMPRESSION: 1. No recurrence of previously seen abdominal wall hernia. No intra-abdominal source of sepsis identified. 2. No hydronephrosis to explain renal failure. 3. Cholelithiasis.   Electronically Signed   By: Jorje Guild M.D.   On: 11/12/2013 04:32   Dg Chest Port 1 View  11/12/2013   CLINICAL DATA:  Weakness and body aches.  EXAM: PORTABLE CHEST - 1 VIEW  COMPARISON:  Single view of the chest 10/15/2013.  FINDINGS: Three-view pacing device in place. Right PICC is again seen. Lungs are clear. Heart size is mildly enlarged. No pneumothorax or pleural effusion.  IMPRESSION: No acute disease.   Electronically Signed   By: Inge Rise M.D.   On: 11/12/2013 00:10    ASSESSMENT / PLAN:  PULMONARY A: OSA, has not worn CPAP x 1 year P:   Retry auto-set CPAP   CARDIOVASCULAR A: Hypotension (resolved with fluids) .  Chronic CHF, EF 55% 11/14 AICD in place Afib P:  No pressors at this time Fluid resuscitation gentle Follow ECG w hyperkalemia  RENAL A:  New renal failure with base creatine .90 and 2.91 on admit in setting of ace-i, lasix, aldactone, and K+ supplements.  Hyperkalemia P:   Needs IV access and with new renal failure will place rt IJ CVL; question if we should place 3 port HD cath in case HD needed. Suspect he will not need HD. Should respond to resuscitation.  Kayexalate, insulin + d50+ ca  GASTROINTESTINAL A: Post colostomy, short gut syndrome P:   Needs cvl for TNA and volume resuscitation  HEMATOLOGIC A:  No acute issue P:    INFECTIOUS A:  Recent abdominal abscess with surgical intervention. P:   Follow cx data vanco +  zosyn ordered 2/6  ENDOCRINE A:  DM P:   SSI  NEUROLOGIC A:  Awake and alert P:   Monitor in setting of renal failure  TODAY'S SUMMARY:  MO, post abdominal abscess post bowel resection, colostomy, short gut syndrome with new hyperkalemia, renal failure, painful PICC with removal and no IV access.  CC time 60 minutes  [name] Minor ACNP [facility] PCCM Pager [phone] till 3 pm If no answer page [phone] 11/12/2013, 10:28 AM  Baltazar Apo, MD, PhD 11/12/2013, 11:53 AM [place] [facility] [phone] or if no answer [phone]

## 2013-11-12 NOTE — Progress Notes (Signed)
INITIAL NUTRITION ASSESSMENT  DOCUMENTATION CODES Per approved criteria  -Morbid Obesity   INTERVENTION: - TPN per pharmacy - Diet advancement per MD - Will continue to monitor   NUTRITION DIAGNOSIS: Inadequate oral intake related to inability to eat as evidenced by NPO.   Goal: TPN to meet >90% of estimated nutritional needs  Monitor:  Weights, labs, TPN, diet advancement  Reason for Assessment: Malnutrition screening tool, home TPN  63 y.o. male  Admitting Dx: Severe sepsis with acute organ dysfunction  ASSESSMENT: Pt with history of recent diagnosis of a very large abdominal wall abscess that required a significant surgical debridement and a prolonged wound healing course and that has left him on total parenteral nutrition, and debilitated at home at the patient's request instead of in a nursing facility. His wife states that he is on continuous TPN, over the last week he has had several blood draws which have shown a gradually rising potassium level and renal dysfunction though the wife does not know the exact numbers. She was told to bring him to the hospital tonight because of his elevated potassium which was reportedly over 7 mEq/L, he has been difficult to arouse over the last 2 days. Pt has been generally weak and unable to get out of bed. She has been caring for his enterocutaneous fistula with ostomy bags which she has fastened in a very unique way because of the size of his wound. Per wife plans had been made for definitive surgical management evaluation in [place] on the 11th of this month. Pt with colostomy and short gut syndrome.   Pt known to RD from admission last year. Discussed home TPN regimen with [facility] and inpatient pharmacist. Pt getting 3L bag of TPN that was provided 2219 calories and 150g of protein which was meeting 94% of estimated calorie needs and 94% of estimated protein needs. Met with pt and wife who report pt was typically getting 12 hour  nightly infusion of TPN however switched to 24 hour continuous on Saturday PTA as pt was c/o weakness. Wife reports pt with minimal PO intake at home, typically a small bite of a boiled egg or applesauce. Pt's stomach growling during visit. Wife reports there have been only 1 or 2 days that pt was not getting TPN at home due to pt c/o soreness in arm from where IV was. Per conversation with pharmacist, no plans for TPN today. Per past weight trend, pt's weight down 47 pounds in the past month, unsure of accuracy of this weight.   Potassium elevated BUN/Cr significantly elevated with low GFR ALT elevated  Nutrition Focused Physical Exam:  Subcutaneous Fat:  Orbital Region: WNL Upper Arm Region: mild/moderate muscle wasting Thoracic and Lumbar Region: WNL  Muscle:  Temple Region: WNL Clavicle Bone Region: WNL Clavicle and Acromion Bone Region: WNL Scapular Bone Region: NA Dorsal Hand: WNL Patellar Region: WNL Anterior Thigh Region: WNL Posterior Calf Region: WNL  Edema: none noted    Height: Ht Readings from Last 1 Encounters:  11/12/13 $RemoveB'5\' 11"'xmUogSoH$  (1.803 m)    Weight: Wt Readings from Last 1 Encounters:  11/12/13 368 lb 2.7 oz (167 kg)    Ideal Body Weight: 172 lb  % Ideal Body Weight: 214%  Wt Readings from Last 10 Encounters:  11/12/13 368 lb 2.7 oz (167 kg)  10/15/13 415 lb 5.6 oz (188.4 kg)  10/15/13 415 lb 5.6 oz (188.4 kg)  10/15/13 415 lb 5.6 oz (188.4 kg)  08/05/13 351 lb 6.6  oz (159.4 kg)  08/27/12 343 lb 7.6 oz (155.8 kg)  06/29/12 341 lb 12.8 oz (155.039 kg)  04/28/08 333 lb 2.1 oz (151.107 kg)    Usual Body Weight: 368 lb  % Usual Body Weight: 100%  BMI:  Body mass index is 51.37 kg/(m^2). Class III extreme obesity  Estimated Nutritional Needs: Kcal: 2370-2650 Protein: 160-180g Fluid: >2.6L/day   Skin: Abdominal abscess   Diet Order: NPO  EDUCATION NEEDS: -No education needs identified at this time   Intake/Output Summary (Last 24 hours) at  11/12/13 1134 Last data filed at 11/12/13 0900  Gross per 24 hour  Intake    180 ml  Output    400 ml  Net   -220 ml    Last BM: PTA  Labs:   Recent Labs Lab 11/11/13 2334 11/11/13 2346 11/12/13 0149 11/12/13 0257 11/12/13 0715 11/12/13 1034  NA 127* 130* 131* 129*  --   --   K 6.8* 6.8* 7.0* 6.4* 7.2* >7.7*  CL 97 106 109 101  --   --   CO2 16*  --   --  14*  --   --   BUN 121* 137* >140* 118*  --   --   CREATININE 2.77* 2.80* 2.90* 2.91*  --   --   CALCIUM 10.9*  --   --  10.5  --   --   GLUCOSE 202* 210* 169* 154*  --   --     CBG (last 3)   Recent Labs  11/12/13 0359 11/12/13 0811  GLUCAP 156* 135*    Scheduled Meds: . sodium chloride   Intravenous STAT  . atorvastatin  40 mg Oral q1800  . carvedilol  3.125 mg Oral BID WC  . heparin  5,000 Units Subcutaneous Q8H  . insulin aspart  0-9 Units Subcutaneous Q4H  . OxyCODONE  20 mg Oral Q12H  . piperacillin-tazobactam (ZOSYN)  IV  3.375 g Intravenous Q8H  . sodium chloride  3 mL Intravenous Q12H  . [START ON 11/13/2013] vancomycin  2,000 mg Intravenous Q24H    Continuous Infusions: . sodium chloride 1,000 mL (11/12/13 0440)    Past Medical History  Diagnosis Date  . Sleep apnea   . Diabetes mellitus   . Hypertension   . Hyperlipidemia   . CHF (congestive heart failure)   . Hernia   . AAA (abdominal aortic aneurysm)/ 4.5 cm ascending per CT angio 08/01/13 08/12/2013  . 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 Constipation, chronic 08/03/2013    Past Surgical History  Procedure Laterality Date  . Appendectomy    . Tonsillectomy    . Nasal septum surgery    . Bowel resection  1980s?    ?colectomy/ostomy - Dr Elesa Hacker  . Ostomy take down  1980s?    Dr Elesa Hacker  . Incisional hernia repair  1980s?    Dr. Lindon Romp - w mesh  . Knee surgery Right   . Incision and drainage abscess N/A 08/14/2013    Procedure: INCISION AND DRAINAGE ABSCESS, mesh ;   Surgeon: [name], MD;  Location: [facility];  Service: General;  Laterality: N/A;    Mikey College MS, [place], [place] Pager [phone] After Hours Pager

## 2013-11-12 NOTE — Progress Notes (Signed)
CRITICAL VALUE ALERT  Critical value received:  K>7.7  Date of notification:  11/12/13  Time of notification:  1125  Critical value read back: yes  Nurse who received alert:  Geannie Risen, RN  MD notified (1st page):  Dr. Lamonte Sakai  Time of first page:  Told on unit 1125  MD notified (2nd page):  Time of second page:  Responding MD:    Time MD responded:

## 2013-11-12 NOTE — Progress Notes (Signed)
Pt placed on Auto CPAP 6-20 CMH2O via FFM with 2 LPM O2 bleed in.  Pt tolerating well at this time, RT to monitor and assess as needed.

## 2013-11-12 NOTE — Progress Notes (Signed)
CARE MANAGEMENT NOTE 11/12/2013  Patient:  Cross,[name]   Account Number:  [id number]  Date Initiated:  11/12/2013  Documentation initiated by:  Particia Strahm  Subjective/Objective Assessment:   patient readmitted due to hypotension,hyperkalemia, sepsis, fistula as before.     Action/Plan:   Has been at home with hhc, plan for surgical intervention of large fistula in [place] on [id number]/   Anticipated DC Date:  11/15/2013   Anticipated DC Plan:  [place]  In-house referral  NA      DC Planning Services  NA      [facility] Choice  Resumption Of Svcs/PTA Provider   Choice offered to / List presented to:  NA   DME arranged  NA      DME agency  [place] arranged  NA      [employer].   Status of service:  In process, will continue to follow Medicare Important Message given?  NA - LOS <3 / Initial given by admissions (If response is "NO", the following Medicare IM given date fields will be blank) Date Medicare IM given:   Date Additional Medicare IM given:    Discharge Disposition:    Per UR Regulation:  Reviewed for med. necessity/level of care/duration of stay  If discussed at [place] of Stay Meetings, dates discussed:    Comments:  [id number]/Kurt Cross, [place], [place] [phone] Chart Reviewed for discharge and hospital needs. Discharge needs at time of review:  None present will follow for needs. Review of patient progress due on [id number].

## 2013-11-12 NOTE — Progress Notes (Signed)
Inpatient Diabetes Program Recommendations  AACE/ADA: New Consensus Statement on Inpatient Glycemic Control (2013)  Target Ranges:  Prepandial:   less than 140 mg/dL      Peak postprandial:   less than 180 mg/dL (1-2 hours)      Critically ill patients:  140 - 180 mg/dL   Reason for Visit: Hyperglycemia  Diabetes history: Type 2 Outpatient Diabetes medications: NPH 40 bid, Novolog 12 bid and Novolog 60 in TNA Current orders for Inpatient glycemic control: Novolog sensitive Q4 hours.  Results for Kurt Cross, Kurt Cross (MRN [id number]) as of 11/12/2013 14:51  Ref. Range 11/12/2013 14:00  Sodium Latest Range: 137-147 mEq/L 130 (L)  Potassium Latest Range: 3.7-5.3 mEq/L 7.0 (HH)  Chloride Latest Range: 96-112 mEq/L 100  CO2 Latest Range: 19-32 mEq/L 15 (L)  BUN Latest Range: 6-23 mg/dL 118 (H)  Creatinine Latest Range: 0.50-1.35 mg/dL 3.02 (H)  Calcium Latest Range: 8.4-10.5 mg/dL 10.8 (H)  GFR calc non Af Amer Latest Range: >90 mL/min 21 (L)  GFR calc Af Amer Latest Range: >90 mL/min 24 (L)  Glucose Latest Range: 70-99 mg/dL 197 (H)   Recommend: ICU Hyperglycemia Protocol. Pharmacy to manage insulin in TNA.  Thank you. Lorenda Peck, RD, LDN, CDE Inpatient Diabetes Coordinator [phone]

## 2013-11-12 NOTE — Consult Note (Signed)
Kurt Cross 12-Jul-1951  [id number].    Requesting MD: Dr. Dillard Essex Chief Complaint/Reason for Consult:  EC fistula and ostomy  HPI:  63 y/o pleasant morbidly obese diabetic smoking male well known to our service. He was just seen in the [place] office with Dr. Johney Maine on 1/30.  He had a giant abscess around the mesh 3 recurrent hernia in November 2014. Develop delayed small bowel fistula, and prolonged hospital stay for several months requiring IV nutrition. Proximal jejunal fistula with inadequate oral intake to wean off of TNA. Giant wound has gradually contracted down. He refused SNF at discharge and went home with help of [facility] and his family.  He has been quite debilitated at home and bound to a wheelchair.  His labs are monitored regularly and he was noted to have gradual rise in his potassium and thus his home health agency referred him to the ER for further evaluation.  His potassium was >7.0.  His wife noted he had been more somnolent and over the last few days he hadn't had any ostomy output.  The patient complained of abdominal discomfort and distension.  Denies N/V.  He complained of a 1 week h/o pain at his PICC line site.  Upon arrival to the ED he was noted to be hypotensive and have hyperkalemia, metabolic acidosis and acute kidney injury.  He was admitted to the ICU.  We were consulted for management of his fistula and colostomy.      ROS: All systems reviewed and otherwise negative except for as above  No family history on file.  Past Medical History  Diagnosis Date  . Sleep apnea   . Diabetes mellitus   . Hypertension   . Hyperlipidemia   . CHF (congestive heart failure)   . Hernia   . AAA (abdominal aortic aneurysm)/ 4.5 cm ascending per CT angio 08/01/13 08/12/2013  . 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 Constipation, chronic 08/03/2013    Past Surgical History  Procedure Laterality Date  . Appendectomy    .  Tonsillectomy    . Nasal septum surgery    . Bowel resection  1980s?    ?colectomy/ostomy - Dr Elesa Hacker  . Ostomy take down  1980s?    Dr Elesa Hacker  . Incisional hernia repair  1980s?    Dr. Lindon Romp - w mesh  . Knee surgery Right   . Incision and drainage abscess N/A 08/14/2013    Procedure: INCISION AND DRAINAGE ABSCESS, mesh ;  Surgeon: [name], MD;  Location: [facility]S;  Service: General;  Laterality: N/A;    Social History:  reports that he quit smoking about 14 months ago. His smoking use included Cigarettes. He has a 40 pack-year smoking history. He has never used smokeless tobacco. He reports that he does not drink alcohol or use illicit drugs.  Allergies: No Known Allergies  Medications Prior to Admission  Medication Sig Dispense Refill  . ADULT TPN Inject into the vein continuous. Patient starts at 7pm-7am each night. Gets TPN at [place] with lipids 3032ml  Additives  31ml infuvite 60 units humulin R Infuse at 168ml/hr      . carvedilol (COREG) 6.25 MG tablet Take 6.25 mg by mouth 2 (two) times daily with a meal.      . furosemide (LASIX) 80 MG tablet Take 80 mg by mouth 2 (two) times daily.      [name] Kitchen HUMULIN R 100  UNIT/ML injection Inject 60 Units into the vein See admin instructions. Takes in his TPN at 2045      . insulin aspart (NOVOLOG) 100 UNIT/ML injection Inject 12 Units into the skin 2 (two) times daily.      . insulin NPH Human (HUMULIN N,NOVOLIN N) 100 UNIT/ML injection Inject 40 Units into the skin 2 (two) times daily before a meal.      . lisinopril (PRINIVIL,ZESTRIL) 10 MG tablet Take 10 mg by mouth 2 (two) times daily.       [name] Kitchen loperamide (IMODIUM) 2 MG capsule Take 1 capsule (2 mg total) by mouth 2 (two) times daily.  30 capsule  0  . OxyCODONE (OXYCONTIN) 20 mg T12A 12 hr tablet Take 1 tablet (20 mg total) by mouth every 12 (twelve) hours.  60 tablet  0  . oxyCODONE-acetaminophen (PERCOCET) 10-325 MG per tablet Take 1 tablet by mouth every 6  (six) hours as needed for pain.       . potassium chloride SA (K-DUR,KLOR-CON) 20 MEQ tablet Take 2 tablets (40 mEq total) by mouth daily.  30 tablet  0  . rosuvastatin (CRESTOR) 20 MG tablet Take 20 mg by mouth every other day.      . spironolactone (ALDACTONE) 100 MG tablet Take 100 mg by mouth daily.        Blood pressure 100/54, pulse 61, temperature 97.7 F (36.5 C), temperature source Axillary, resp. rate 18, height $RemoveBe'5\' 11"'ZahrSSYaf$  (1.803 m), weight 368 lb 2.7 oz (167 kg), SpO2 99.00%. Physical Exam: General: pleasant, WD/WN AA male who is laying in bed in NAD HEENT: head is normocephalic, atraumatic.  Sclera are noninjected.  PERRL.  Ears and nose without any masses or lesions.  Mouth is pink and moist Heart: regular, rate, and rhythm.  No obvious murmurs, gallops, or rubs noted.  Palpable pedal pulses bilaterally Lungs: CTAB, no wheezes, rhonchi, or rales noted.  Respiratory effort nonlabored Abd: soft, minimal tenderness, ND; ostomy is patent with large amounts of liquid and solid green stools which are leaking around the ostomy bag, Tegaderm and gauze over midline wound/EC fistula with greenish output MS: all 4 extremities are symmetrical with no cyanosis, clubbing, or edema. Skin: warm and dry with no masses, lesions, or rashes Psych: A&Ox3 with an appropriate affect. Neuro: Extremity CSM intact bilaterally, normal speech  Results for orders placed during the hospital encounter of 11/11/13 (from the past 48 hour(s))  CBC WITH DIFFERENTIAL     Status: Abnormal   Collection Time    11/11/13 11:34 PM      Result Value Range   WBC 16.1 (*) 4.0 - 10.5 K/uL   RBC 4.38  4.22 - 5.81 MIL/uL   Hemoglobin 13.4  13.0 - 17.0 g/dL   HCT 39.5  39.0 - 52.0 %   MCV 90.2  78.0 - 100.0 fL   MCH 30.6  26.0 - 34.0 pg   MCHC 33.9  30.0 - 36.0 g/dL   RDW 14.3  11.5 - 15.5 %   Platelets 277  150 - 400 K/uL   Neutrophils Relative % 73  43 - 77 %   Lymphocytes Relative 17  12 - 46 %   Monocytes Relative 9   3 - 12 %   Eosinophils Relative 1  0 - 5 %   Basophils Relative 0  0 - 1 %   Neutro Abs 11.8 (*) 1.7 - 7.7 K/uL   Lymphs Abs 2.7  0.7 - 4.0 K/uL  Monocytes Absolute 1.4 (*) 0.1 - 1.0 K/uL   Eosinophils Absolute 0.2  0.0 - 0.7 K/uL   Basophils Absolute 0.0  0.0 - 0.1 K/uL   WBC Morphology ATYPICAL LYMPHOCYTES     Smear Review LARGE PLATELETS PRESENT    COMPREHENSIVE METABOLIC PANEL     Status: Abnormal   Collection Time    11/11/13 11:34 PM      Result Value Range   Sodium 127 (*) 137 - 147 mEq/L   Potassium 6.8 (*) 3.7 - 5.3 mEq/L   Comment: NO VISIBLE HEMOLYSIS     CRITICAL RESULT CALLED TO, READ BACK BY AND VERIFIED WITH:     MAYS,L/ED $RemoveBe'@0030'[id number]$  ON 11/12/13 BY KARCZEWSKI,S.   Chloride 97  96 - 112 mEq/L   CO2 16 (*) 19 - 32 mEq/L   Glucose, Bld 202 (*) 70 - 99 mg/dL   BUN 121 (*) 6 - 23 mg/dL   Creatinine, Ser 2.77 (*) 0.50 - 1.35 mg/dL   Calcium 10.9 (*) 8.4 - 10.5 mg/dL   Total Protein 8.9 (*) 6.0 - 8.3 g/dL   Albumin 3.2 (*) 3.5 - 5.2 g/dL   AST 22  0 - 37 U/L   ALT 57 (*) 0 - 53 U/L   Alkaline Phosphatase 54  39 - 117 U/L   Total Bilirubin 0.6  0.3 - 1.2 mg/dL   GFR calc non Af Amer 23 (*) >90 mL/min   GFR calc Af Amer 27 (*) >90 mL/min   Comment: (NOTE)     The eGFR has been calculated using the CKD EPI equation.     This calculation has not been validated in all clinical situations.     eGFR's persistently <90 mL/min signify possible Chronic Kidney     Disease.  APTT     Status: None   Collection Time    11/11/13 11:34 PM      Result Value Range   aPTT 37  24 - 37 seconds   Comment:            IF BASELINE aPTT IS ELEVATED,     SUGGEST PATIENT RISK ASSESSMENT     BE USED TO DETERMINE APPROPRIATE     ANTICOAGULANT THERAPY.  PROCALCITONIN     Status: None   Collection Time    11/11/13 11:34 PM      Result Value Range   Procalcitonin 0.73     Comment:            Interpretation:     PCT > 0.5 ng/mL and <= 2 ng/mL:     Systemic infection (sepsis) is possible,      but other conditions are known to elevate     PCT as well.     (NOTE)             ICU PCT Algorithm               Non ICU PCT Algorithm        ----------------------------     ------------------------------             PCT < 0.25 ng/mL                 PCT < 0.1 ng/mL         Stopping of antibiotics            Stopping of antibiotics           strongly encouraged.  strongly encouraged.        ----------------------------     ------------------------------           PCT level decrease by               PCT < 0.25 ng/mL           >= 80% from peak PCT           OR PCT 0.25 - 0.5 ng/mL          Stopping of antibiotics                                                 encouraged.         Stopping of antibiotics               encouraged.        ----------------------------     ------------------------------           PCT level decrease by              PCT >= 0.25 ng/mL           < 80% from peak PCT            AND PCT >= 0.5 ng/mL            Continuing antibiotics                                                  encouraged.           Continuing antibiotics                encouraged.        ----------------------------     ------------------------------         PCT level increase compared          PCT > 0.5 ng/mL             with peak PCT AND              PCT >= 0.5 ng/mL             Escalation of antibiotics                                              strongly encouraged.          Escalation of antibiotics            strongly encouraged.  PROTIME-INR     Status: Abnormal   Collection Time    11/11/13 11:34 PM      Result Value Range   Prothrombin Time 15.9 (*) 11.6 - 15.2 seconds   INR 1.30  0.00 - 1.49  POCT I-STAT TROPONIN I     Status: None   Collection Time    11/11/13 11:44 PM      Result Value Range   Troponin i, poc 0.00  0.00 - 0.08 ng/mL   Comment 3            Comment: Due to the release kinetics of cTnI,  a negative result within the first hours     of  the onset of symptoms does not rule out     myocardial infarction with certainty.     If myocardial infarction is still suspected,     repeat the test at appropriate intervals.  POCT I-STAT, CHEM 8     Status: Abnormal   Collection Time    11/11/13 11:46 PM      Result Value Range   Sodium 130 (*) 137 - 147 mEq/L   Potassium 6.8 (*) 3.7 - 5.3 mEq/L   Chloride 106  96 - 112 mEq/L   BUN 137 (*) 6 - 23 mg/dL   Creatinine, Ser 2.80 (*) 0.50 - 1.35 mg/dL   Glucose, Bld 210 (*) 70 - 99 mg/dL   Calcium, Ion 1.49 (*) 1.13 - 1.30 mmol/L   TCO2 15  0 - 100 mmol/L   Hemoglobin 15.3  13.0 - 17.0 g/dL   HCT 45.0  39.0 - 52.0 %   Comment NOTIFIED PHYSICIAN    CG4 I-STAT (LACTIC ACID)     Status: None   Collection Time    11/11/13 11:46 PM      Result Value Range   Lactic Acid, Venous 0.76  0.5 - 2.2 mmol/L  POCT I-STAT, CHEM 8     Status: Abnormal   Collection Time    11/12/13  1:49 AM      Result Value Range   Sodium 131 (*) 137 - 147 mEq/L   Potassium 7.0 (*) 3.7 - 5.3 mEq/L   Chloride 109  96 - 112 mEq/L   BUN >140 (*) 6 - 23 mg/dL   Creatinine, Ser 2.90 (*) 0.50 - 1.35 mg/dL   Glucose, Bld 169 (*) 70 - 99 mg/dL   Calcium, Ion 1.48 (*) 1.13 - 1.30 mmol/L   TCO2 19  0 - 100 mmol/L   Hemoglobin 13.3  13.0 - 17.0 g/dL   HCT 39.0  39.0 - 52.0 %   Comment NOTIFIED PHYSICIAN    BASIC METABOLIC PANEL     Status: Abnormal   Collection Time    11/12/13  2:57 AM      Result Value Range   Sodium 129 (*) 137 - 147 mEq/L   Potassium 6.4 (*) 3.7 - 5.3 mEq/L   Chloride 101  96 - 112 mEq/L   CO2 14 (*) 19 - 32 mEq/L   Glucose, Bld 154 (*) 70 - 99 mg/dL   BUN 118 (*) 6 - 23 mg/dL   Creatinine, Ser 2.91 (*) 0.50 - 1.35 mg/dL   Calcium 10.5  8.4 - 10.5 mg/dL   GFR calc non Af Amer 22 (*) >90 mL/min   GFR calc Af Amer 25 (*) >90 mL/min   Comment: (NOTE)     The eGFR has been calculated using the CKD EPI equation.     This calculation has not been validated in all clinical situations.      eGFR's persistently <90 mL/min signify possible Chronic Kidney     Disease.  GLUCOSE, CAPILLARY     Status: Abnormal   Collection Time    11/12/13  3:59 AM      Result Value Range   Glucose-Capillary 156 (*) 70 - 99 mg/dL  MRSA PCR SCREENING     Status: None   Collection Time    11/12/13  5:34 AM      Result Value Range   MRSA by PCR NEGATIVE  NEGATIVE   Comment:  The GeneXpert MRSA Assay (FDA     approved for NASAL specimens     only), is one component of a     comprehensive MRSA colonization     surveillance program. It is not     intended to diagnose MRSA     infection nor to guide or     monitor treatment for     MRSA infections.  POTASSIUM     Status: Abnormal   Collection Time    11/12/13  7:15 AM      Result Value Range   Potassium 7.2 (*) 3.7 - 5.3 mEq/L   Comment: NO VISIBLE HEMOLYSIS     CRITICAL RESULT CALLED TO, READ BACK BY AND VERIFIED WITH:     WESTLEY,S RN AT 0746 02.06.15 BY TIBBITTS,K  GLUCOSE, CAPILLARY     Status: Abnormal   Collection Time    11/12/13  8:11 AM      Result Value Range   Glucose-Capillary 135 (*) 70 - 99 mg/dL   Comment 1 Documented in Chart     Comment 2 Notify RN    POTASSIUM     Status: Abnormal   Collection Time    11/12/13 10:34 AM      Result Value Range   Potassium >7.7 (*) 3.7 - 5.3 mEq/L   Comment: NO VISIBLE HEMOLYSIS     RUSSEL,S RN AT 1126 02.06.15 BY TIBBITTS,K     NO VISIBLE HEMOLYSIS     CRITICAL RESULT CALLED TO, READ BACK BY AND VERIFIED WITH:     RUSSEL,S RN AT 1126 02.06.15 BY TIBBITTS,K  BASIC METABOLIC PANEL     Status: Abnormal   Collection Time    11/12/13  2:00 PM      Result Value Range   Sodium 130 (*) 137 - 147 mEq/L   Potassium 7.0 (*) 3.7 - 5.3 mEq/L   Comment: NO VISIBLE HEMOLYSIS     CRITICAL RESULT CALLED TO, READ BACK BY AND VERIFIED WITH:     B RUSSELL RN 1438 11/12/13 A NAVARRO   Chloride 100  96 - 112 mEq/L   CO2 15 (*) 19 - 32 mEq/L   Glucose, Bld 197 (*) 70 - 99 mg/dL   BUN  118 (*) 6 - 23 mg/dL   Creatinine, Ser 3.02 (*) 0.50 - 1.35 mg/dL   Calcium 10.8 (*) 8.4 - 10.5 mg/dL   GFR calc non Af Amer 21 (*) >90 mL/min   GFR calc Af Amer 24 (*) >90 mL/min   Comment: (NOTE)     The eGFR has been calculated using the CKD EPI equation.     This calculation has not been validated in all clinical situations.     eGFR's persistently <90 mL/min signify possible Chronic Kidney     Disease.  PROCALCITONIN     Status: None   Collection Time    11/12/13  2:00 PM      Result Value Range   Procalcitonin 0.74     Comment:            Interpretation:     PCT > 0.5 ng/mL and <= 2 ng/mL:     Systemic infection (sepsis) is possible,     but other conditions are known to elevate     PCT as well.     (NOTE)             ICU PCT Algorithm  Non ICU PCT Algorithm        ----------------------------     ------------------------------             PCT < 0.25 ng/mL                 PCT < 0.1 ng/mL         Stopping of antibiotics            Stopping of antibiotics           strongly encouraged.               strongly encouraged.        ----------------------------     ------------------------------           PCT level decrease by               PCT < 0.25 ng/mL           >= 80% from peak PCT           OR PCT 0.25 - 0.5 ng/mL          Stopping of antibiotics                                                 encouraged.         Stopping of antibiotics               encouraged.        ----------------------------     ------------------------------           PCT level decrease by              PCT >= 0.25 ng/mL           < 80% from peak PCT            AND PCT >= 0.5 ng/mL            Continuing antibiotics                                                  encouraged.           Continuing antibiotics                encouraged.        ----------------------------     ------------------------------         PCT level increase compared          PCT > 0.5 ng/mL             with  peak PCT AND              PCT >= 0.5 ng/mL             Escalation of antibiotics                                              strongly encouraged.          Escalation of antibiotics            strongly encouraged.   Ct Abdomen Pelvis Wo  Contrast  11/12/2013   CLINICAL DATA:  Evaluate for recurrence of abscess  EXAM: CT ABDOMEN AND PELVIS WITHOUT CONTRAST  TECHNIQUE: Multidetector CT imaging of the abdomen and pelvis was performed following the standard protocol without intravenous contrast.  COMPARISON:  08/14/2013  FINDINGS: BODY WALL: There is an ostomy in the right lower quadrant. Ostomy defect is relatively large in the anterior abdominal wall, status post mesh removal and abscess drainage. There is no fluid collection currently. 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 Contrast does not reach these segments of bowel to confirm the persistence of fistula. There is a distinct left lower quadrant ventral wall hernia which contains a knuckle of nonobstructed sigmoid colon. Bilateral fatty inguinal hernias.  LOWER CHEST: Unremarkable.  ABDOMEN/PELVIS:  Liver: No focal abnormality.  Biliary: Cholelithiasis. No gallbladder wall thickening or pericholecystic edema.  Pancreas: Unremarkable.  Spleen: Unremarkable.  Adrenals: Large, lobulated adrenal glands, likely from multiple adenomas.  Kidneys and ureters: No hydronephrosis or stone.  Bladder: Contains urine.  Reproductive: Unremarkable.  Bowel: No bowel obstruction.  Stoma and neighboring findings above.  Retroperitoneum: No mass or adenopathy.  Peritoneum: No free fluid or gas.  Vascular: No acute abnormality.  OSSEOUS: No acute abnormalities. Stable appearance of the degenerated symphysis pubis and SI joints. Bulky spondylosis in the lower thoracic spine.  IMPRESSION: 1. No recurrence of previously seen abdominal wall hernia. No intra-abdominal source of sepsis  identified. 2. No hydronephrosis to explain renal failure. 3. Cholelithiasis.   Electronically Signed   By: Jorje Guild M.D.   On: 11/12/2013 04:32   Dg Chest Port 1 View  11/12/2013   CLINICAL DATA:  Status post central line placement  EXAM: PORTABLE CHEST - 1 VIEW  COMPARISON:  11/12/2013  FINDINGS: Cardiac shadow is stable. A pacing device is again seen. A new right-sided central venous line is seen with the catheter tip in the mid superior vena cava. No pneumothorax is noted. The previously seen right-sided PICC line is been removed in the interval. The lungs remain clear.  IMPRESSION: Status post central line placement as described. No pneumothorax is noted.   Electronically Signed   By: Inez Catalina M.D.   On: 11/12/2013 12:37   Dg Chest Port 1 View  11/12/2013   CLINICAL DATA:  Weakness and body aches.  EXAM: PORTABLE CHEST - 1 VIEW  COMPARISON:  Single view of the chest 10/15/2013.  FINDINGS: Three-view pacing device in place. Right PICC is again seen. Lungs are clear. Heart size is mildly enlarged. No pneumothorax or pleural effusion.  IMPRESSION: No acute disease.   Electronically Signed   By: Inge Rise M.D.   On: 11/12/2013 00:10      Assessment/Plan Sepsis EC fistula AKI Hyperkalemia Dehydration  Plan: 1.  Do not feel his sepsis/renal failure is due to his abdomen, fistula is controlled and wound is significantly smaller, ostomy is working again, but ostomy bag fit is a problem 2.  Appreciate CCM, hospitalist, and nephrology's management of the patient 3.  Would get [place] nurse to help with dressing changes, fistula and ostomy management 4.  Will likely needs SNF at discharge for better management of his EC fistula    Coralie Keens, [facility] 11/12/2013, 3:36 PM Pager: [phone]

## 2013-11-12 NOTE — Consult Note (Signed)
WOC wound consult note Reason for Consult:Midline abdominal wound with stomatized fistula.  Well known to me from previous admission.Patient and wife pleased with re-implementation of a pouching system that had worked for them previously. Wound type: Surgical with stomatized fistula. Pressure Ulcer POA: No Measurement: 16 x 8.5 x 0.5cm with minimal undermining at 6 o'clock (0.5cm), 7 o'clock (1cm) and 12 o'clock (0.5cm) Wound bed: Drainage (amount, consistency, odor) Red/brown liquid effluent with copious air Periwound:Intact. Dressing procedure/placement/frequency:An Eakin pouching system is applied and attached to continuous negative pressure at 70-80 mmHg. [place] nursing team will follow, and will remain available to this patient, his family, the nursing, medical and surgical teams.  Please re-consult if needed in between visits. Thanks, Maudie Flakes, MSN, RN, [place], [place] [address], [place] [phone])

## 2013-11-12 NOTE — Progress Notes (Signed)
ANTIBIOTIC CONSULT NOTE - INITIAL  Pharmacy Consult for Vancomycin/zosyn Indication: Sepsis, PICC infection, abscess  No Known Allergies  Patient Measurements: Height: 6' 0.05" (183 cm) Weight: 414 lb 7.4 oz (188 kg) IBW/kg (Calculated) : 77.71 Adjusted Body Weight:   Vital Signs: Temp: 100.6 F (38.1 C) (02/05 2348) Temp src: Rectal (02/05 2348) BP: 106/64 mmHg (02/06 0353) Pulse Rate: 62 (02/06 0436) Intake/Output from previous day: 02/05 0701 - 02/06 0700 In: -  Out: 400 [Stool:400] Intake/Output from this shift: Total I/O In: -  Out: 400 [Stool:400]  Labs:  Recent Labs  11/11/13 2334 11/11/13 2346 11/12/13 0149 11/12/13 0257  WBC 16.1*  --   --   --   HGB 13.4 15.3 13.3  --   PLT 277  --   --   --   CREATININE 2.77* 2.80* 2.90* 2.91*   Estimated Creatinine Clearance: 46.5 ml/min (by C-G formula based on Cr of 2.91). No results found for this basename: VANCOTROUGH, VANCOPEAK, VANCORANDOM, GENTTROUGH, GENTPEAK, GENTRANDOM, TOBRATROUGH, TOBRAPEAK, TOBRARND, AMIKACINPEAK, AMIKACINTROU, AMIKACIN,  in the last 72 hours   Microbiology: No results found for this or any previous visit (from the past 720 hour(s)).  Medical History: Past Medical History  Diagnosis Date  . Sleep apnea   . Diabetes mellitus   . Hypertension   . Hyperlipidemia   . CHF (congestive heart failure)   . Hernia   . AAA (abdominal aortic aneurysm)/ 4.5 cm ascending per CT angio 08/01/13 08/12/2013  . 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 Constipation, chronic 08/03/2013    Medications:  Anti-infectives   Start     Dose/Rate Route Frequency Ordered Stop   11/13/13 0600  vancomycin (VANCOCIN) 2,000 mg in sodium chloride 0.9 % 500 mL IVPB     2,000 mg 250 mL/hr over 120 Minutes Intravenous Every 24 hours 11/12/13 0554     11/12/13 1400  piperacillin-tazobactam (ZOSYN) IVPB 3.375 g     3.375 g 12.5 mL/hr over 240 Minutes  Intravenous 3 times per day 11/12/13 0553     11/12/13 0600  vancomycin (VANCOCIN) 2,250 mg in sodium chloride 0.9 % 500 mL IVPB     2,250 mg 250 mL/hr over 120 Minutes Intravenous  Once 11/12/13 0553     11/12/13 0115  vancomycin (VANCOCIN) IVPB 1000 mg/200 mL premix  Status:  Discontinued     1,000 mg 200 mL/hr over 60 Minutes Intravenous  Once 11/12/13 0107 11/12/13 0553   11/12/13 0115  piperacillin-tazobactam (ZOSYN) IVPB 3.375 g     3.375 g 100 mL/hr over 30 Minutes Intravenous STAT 11/12/13 0107 11/12/13 0510     Assessment: Patient septic with either PICC infection or abscess.  Goal of Therapy:  Vancomycin trough level 15-20 mcg/ml Zosyn based on renal function   Plan:  Measure antibiotic drug levels at steady state Follow up culture results Vancomycin 2250mg  iv x1, then 2gm iv q24hr Zosyn 3.375g IV Q8H infused over 4hrs.   Tyler Deis, [facility] St[facility] [place] 11/12/2013,6:04 AM

## 2013-11-12 NOTE — ED Notes (Signed)
Report given to ICU RN Patient to be transported upstairs after CT scan

## 2013-11-12 NOTE — ED Notes (Signed)
Dr. Alcario Drought at bedside  Per MD, current PICC line needs to be D/C immediately  IV team RN paged

## 2013-11-12 NOTE — ED Notes (Signed)
IV team RN at bedside to remove PICC line

## 2013-11-12 NOTE — ED Notes (Signed)
Patient transported to CT 

## 2013-11-12 NOTE — ED Notes (Signed)
Cath Tip Culture completed by RN, Clydene Laming.

## 2013-11-12 NOTE — Progress Notes (Signed)
CRITICAL VALUE ALERT  Critical value received:  K 7.0  Date of notification:  11/12/13  Time of notification:  1430  Critical value read back: yes  Nurse who received alert:  Geannie Risen, RN  MD notified (1st page):  Consistent with previous results  Time of first page:    MD notified (2nd page):  Time of second page:  Responding MD:    Time MD responded:

## 2013-11-12 NOTE — Consult Note (Signed)
Challenging situation.  Pleasant patient and devoted wife.  They have manage ostomies in the past.  His giant wound with jejunal enterocutaneous fistula has slowly been closing down.  He was able to tolerate some liquids and solid food.  They were trying to self cycle the IV parenteral nutrition to nightly only.  Labs had been okay as of 2 weeks ago.  Discuss with primary care physician whom was managing the IV parenteral nutrition.  We then made 2 attempts did set up outpatient ostomy care through [facility] at [facility].  A little too much for home health handle.  They have been declining skilled nursing facility so far.  Apparently potassium elevated earlier this week.  Came in deconditioned and elevated potassium and renal failure.  I discussed with Dr. Lamonte Sakai with critical care medicine.  They will try and control the hyperkalemia with the usual interventions of a challenge and some of the gastrointestinal tract.  Nephrology aware.  Hopefully can avoid dialysis but it is a concern.  CT scan shows no abscess.  He has a giant frozen abdomen and so abscess unlikely.  This is the case of dehydration In the setting of hyperalimentation.  Aggressive fluid resuscitation should hopefully reverse it.  We will see.

## 2013-11-13 DIAGNOSIS — I10 Essential (primary) hypertension: Secondary | ICD-10-CM

## 2013-11-13 DIAGNOSIS — I4891 Unspecified atrial fibrillation: Secondary | ICD-10-CM

## 2013-11-13 DIAGNOSIS — A419 Sepsis, unspecified organism: Principal | ICD-10-CM

## 2013-11-13 LAB — BASIC METABOLIC PANEL
BUN: 98 mg/dL — ABNORMAL HIGH (ref 6–23)
BUN: 92 mg/dL — ABNORMAL HIGH (ref 6–23)
BUN: 89 mg/dL — AB (ref 6–23)
BUN: 71 mg/dL — AB (ref 6–23)
BUN: 74 mg/dL — ABNORMAL HIGH (ref 6–23)
BUN: 68 mg/dL — AB (ref 6–23)
CALCIUM: 9.9 mg/dL (ref 8.4–10.5)
CALCIUM: 9.6 mg/dL (ref 8.4–10.5)
CALCIUM: 9.3 mg/dL (ref 8.4–10.5)
CHLORIDE: 94 meq/L — AB (ref 96–112)
CO2: 21 mEq/L (ref 19–32)
CO2: 22 mEq/L (ref 19–32)
CO2: 22 mEq/L (ref 19–32)
CO2: 20 meq/L (ref 19–32)
CO2: 23 meq/L (ref 19–32)
CO2: 24 mEq/L (ref 19–32)
CREATININE: 1.88 mg/dL — AB (ref 0.50–1.35)
CREATININE: 2.13 mg/dL — AB (ref 0.50–1.35)
Calcium: 9.7 mg/dL (ref 8.4–10.5)
Calcium: 9.4 mg/dL (ref 8.4–10.5)
Calcium: 8.1 mg/dL — ABNORMAL LOW (ref 8.4–10.5)
Chloride: 98 mEq/L (ref 96–112)
Chloride: 98 mEq/L (ref 96–112)
Chloride: 104 mEq/L (ref 96–112)
Chloride: 99 mEq/L (ref 96–112)
Chloride: 98 mEq/L (ref 96–112)
Creatinine, Ser: 2.57 mg/dL — ABNORMAL HIGH (ref 0.50–1.35)
Creatinine, Ser: 2.54 mg/dL — ABNORMAL HIGH (ref 0.50–1.35)
Creatinine, Ser: 2.32 mg/dL — ABNORMAL HIGH (ref 0.50–1.35)
Creatinine, Ser: 2.03 mg/dL — ABNORMAL HIGH (ref 0.50–1.35)
GFR calc Af Amer: 30 mL/min — ABNORMAL LOW (ref >90)
GFR calc Af Amer: 33 mL/min — ABNORMAL LOW (ref >90)
GFR calc Af Amer: 43 mL/min — ABNORMAL LOW (ref >90)
GFR calc Af Amer: 37 mL/min — ABNORMAL LOW (ref >90)
GFR calc Af Amer: 39 mL/min — ABNORMAL LOW (ref >90)
GFR calc non Af Amer: 26 mL/min — ABNORMAL LOW (ref >90)
GFR calc non Af Amer: 29 mL/min — ABNORMAL LOW (ref >90)
GFR calc non Af Amer: 37 mL/min — ABNORMAL LOW (ref >90)
GFR calc non Af Amer: 32 mL/min — ABNORMAL LOW (ref >90)
GFR, EST AFRICAN AMERICAN: 30 mL/min — AB (ref >90)
GFR, EST NON AFRICAN AMERICAN: 26 mL/min — AB (ref >90)
GFR, EST NON AFRICAN AMERICAN: 34 mL/min — AB (ref >90)
GLUCOSE: 203 mg/dL — AB (ref 70–99)
GLUCOSE: 242 mg/dL — AB (ref 70–99)
GLUCOSE: 245 mg/dL — AB (ref 70–99)
GLUCOSE: 293 mg/dL — AB (ref 70–99)
GLUCOSE: 281 mg/dL — AB (ref 70–99)
Glucose, Bld: 202 mg/dL — ABNORMAL HIGH (ref 70–99)
POTASSIUM: 5.7 meq/L — AB (ref 3.7–5.3)
POTASSIUM: 5.5 meq/L — AB (ref 3.7–5.3)
POTASSIUM: 5.4 meq/L — AB (ref 3.7–5.3)
Potassium: 6 mEq/L — ABNORMAL HIGH (ref 3.7–5.3)
Potassium: 5.1 mEq/L (ref 3.7–5.3)
Potassium: 5.6 mEq/L — ABNORMAL HIGH (ref 3.7–5.3)
SODIUM: 130 meq/L — AB (ref 137–147)
SODIUM: 131 meq/L — AB (ref 137–147)
Sodium: 130 mEq/L — ABNORMAL LOW (ref 137–147)
Sodium: 136 mEq/L — ABNORMAL LOW (ref 137–147)
Sodium: 135 mEq/L — ABNORMAL LOW (ref 137–147)
Sodium: 133 mEq/L — ABNORMAL LOW (ref 137–147)

## 2013-11-13 LAB — CBC
HCT: 35 % — ABNORMAL LOW (ref 39.0–52.0)
Hemoglobin: 11.7 g/dL — ABNORMAL LOW (ref 13.0–17.0)
MCH: 30.1 pg (ref 26.0–34.0)
MCHC: 33.4 g/dL (ref 30.0–36.0)
MCV: 90 fL (ref 78.0–100.0)
Platelets: 259 10*3/uL (ref 150–400)
RBC: 3.89 MIL/uL — ABNORMAL LOW (ref 4.22–5.81)
RDW: 14.5 % (ref 11.5–15.5)
WBC: 12.3 10*3/uL — ABNORMAL HIGH (ref 4.0–10.5)

## 2013-11-13 LAB — URINALYSIS, ROUTINE W REFLEX MICROSCOPIC
Bilirubin Urine: NEGATIVE
GLUCOSE, UA: NEGATIVE mg/dL
Ketones, ur: NEGATIVE mg/dL
Nitrite: NEGATIVE
PH: 5 (ref 5.0–8.0)
Protein, ur: NEGATIVE mg/dL
Specific Gravity, Urine: 1.015 (ref 1.005–1.030)
Urobilinogen, UA: 0.2 mg/dL (ref 0.0–1.0)

## 2013-11-13 LAB — URINE MICROSCOPIC-ADD ON

## 2013-11-13 LAB — PHOSPHORUS: Phosphorus: 5.1 mg/dL — ABNORMAL HIGH (ref 2.3–4.6)

## 2013-11-13 LAB — GLUCOSE, CAPILLARY
GLUCOSE-CAPILLARY: 196 mg/dL — AB (ref 70–99)
GLUCOSE-CAPILLARY: 292 mg/dL — AB (ref 70–99)
Glucose-Capillary: 193 mg/dL — ABNORMAL HIGH (ref 70–99)
Glucose-Capillary: 225 mg/dL — ABNORMAL HIGH (ref 70–99)
Glucose-Capillary: 253 mg/dL — ABNORMAL HIGH (ref 70–99)
Glucose-Capillary: 312 mg/dL — ABNORMAL HIGH (ref 70–99)

## 2013-11-13 LAB — PROCALCITONIN: Procalcitonin: 0.44 ng/mL

## 2013-11-13 LAB — MAGNESIUM: Magnesium: 2.2 mg/dL (ref 1.5–2.5)

## 2013-11-13 MED ORDER — DEXTROSE-NACL 5-0.9 % IV SOLN: INTRAVENOUS | Status: AC

## 2013-11-13 MED ORDER — SODIUM CHLORIDE 0.9 % IJ SOLN
10.0000 mL | INTRAMUSCULAR | Status: DC | PRN
  Administered 2013-11-14 – 2013-11-15 (×3): 10 mL

## 2013-11-13 MED ORDER — INSULIN ASPART 100 UNIT/ML ~~LOC~~ SOLN
0.0000 [IU] | SUBCUTANEOUS | Status: DC
  Administered 2013-11-13: 11 [IU] via SUBCUTANEOUS
  Administered 2013-11-13: 8 [IU] via SUBCUTANEOUS
  Administered 2013-11-14 (×2): 5 [IU] via SUBCUTANEOUS
  Administered 2013-11-14: 8 [IU] via SUBCUTANEOUS

## 2013-11-13 MED ORDER — FAT EMULSION 20 % IV EMUL
250.0000 mL | INTRAVENOUS | Status: AC
  Administered 2013-11-13: 250 mL via INTRAVENOUS
  Filled 2013-11-13: qty 250

## 2013-11-13 MED ORDER — TRACE MINERALS CR-CU-F-FE-I-MN-MO-SE-ZN IV SOLN
INTRAVENOUS | Status: AC
  Administered 2013-11-13: 18:00:00 via INTRAVENOUS
  Filled 2013-11-13: qty 2000

## 2013-11-13 MED ORDER — DEXTROSE-NACL 5-0.9 % IV SOLN
INTRAVENOUS | Status: AC
  Administered 2013-11-13: 12:00:00 via INTRAVENOUS

## 2013-11-13 MED ORDER — SODIUM CHLORIDE 0.9 % IJ SOLN
10.0000 mL | Freq: Two times a day (BID) | INTRAMUSCULAR | Status: DC
  Administered 2013-11-13: 10 mL

## 2013-11-13 MED ORDER — LIP MEDEX EX OINT
TOPICAL_OINTMENT | CUTANEOUS | Status: DC | PRN
  Filled 2013-11-13: qty 7

## 2013-11-13 MED ORDER — SODIUM POLYSTYRENE SULFONATE 15 GM/60ML PO SUSP
30.0000 g | Freq: Once | ORAL | Status: AC
  Administered 2013-11-13: 30 g via ORAL
  Filled 2013-11-13: qty 120

## 2013-11-13 NOTE — Progress Notes (Signed)
Name: Kurt Cross MRN: [id number] DOB: 1951/02/26    ADMISSION DATE:  11/11/2013 CONSULTATION DATE: 2/6  REFERRING MD : [facility] PRIMARY SERVICE: [facility]  CHIEF COMPLAINT: Abd pain  BRIEF PATIENT DESCRIPTION: MO, OSA not on CPAP, post abdominal abscess post bowel resection, colostomy, short gut syndrome admitted 2/6 with new hyperkalemia, renal failure, painful PICC with removal and no IV access.  SIGNIFICANT EVENTS / STUDIES:   LINES / TUBES: PICC R UE pre-admit >> 2/6 2/6 rt CVL>>  CULTURES: 2/5 bc x 2>> 2/6 picc tip cath>>ng 2/6 UC>>  ANTIBIOTICS: 2/6 vanc>> 2/6 zoysn>>  HISTORY OF PRESENT ILLNESS:   MO, OSA not on CPAP for last yr, CHF with AICD, post abdominal abscess with infected mesh and removal,  post bowel resection, colostomy, short gut syndrome. He was admitted 2/6 with new hyperkalemia, renal failure, painful RUE PICC with removal. Now with no IV access. He presented with pain in abd and low urine output, hyperkalemia. His lactic acid was .76 and procalcitonin .73. BP was soft but has improved with fluid. Bladder scan revealed 150 cc of urine. Due to multiple medical comorbidites and open wounds he has great potential to become critically ill therefore PCCM asked to assist in his care.  SUBJECTIVE:  Uncomfortable but in no distress  VITAL SIGNS: Temp:  [98.3 F (36.8 C)-98.7 F (37.1 C)] 98.4 F (36.9 C) (02/07 0800) Pulse Rate:  [59-79] 68 (02/07 1100) Resp:  [11-28] 18 (02/07 1100) BP: (90-142)/(37-74) 114/51 mmHg (02/07 1100) SpO2:  [95 %-100 %] 98 % (02/07 1100) HEMODYNAMICS: CVP:  [4 mmHg-10 mmHg] 8 mmHg VENTILATOR SETTINGS:   INTAKE / OUTPUT: Intake/Output     02/06 0701 - 02/07 0700 02/07 0701 - 02/08 0700   P.O. 180    I.V. (mL/kg) 2975 (17.8) 600 (3.6)   IV Piggyback 1250 500   Total Intake(mL/kg) 4405 (26.4) 1100 (6.6)   Urine (mL/kg/hr) 1735 (0.4) 200 (0.3)   Stool 1380 (0.3) 250 (0.3)   Total Output 3115 450   Net +1290 +650        Urine Occurrence 1 x    Stool Occurrence 3 x      PHYSICAL EXAMINATION: General:  MOAAM NAD at rest Neuro:  Intact HEENT: Multiple chins, no jvd/lan Cardiovascular:  HSR RRR Lungs:  diminished in bases Abdomen:  Obese, open midline incision, colostomy  Musculoskeletal:  intact Skin:  warm  LABS:  CBC  Recent Labs Lab 11/11/13 2334 11/11/13 2346 11/12/13 0149 11/13/13 0620  WBC 16.1*  --   --  12.3*  HGB 13.4 15.3 13.3 11.7*  HCT 39.5 45.0 39.0 35.0*  PLT 277  --   --  259   Coag's  Recent Labs Lab 11/11/13 2334  APTT 37  INR 1.30   BMET  Recent Labs Lab 11/13/13 0250 11/13/13 0620 11/13/13 1010  NA 130* 131* 130*  K 5.7* 6.0* 5.5*  CL 98 98 94*  CO2 21 22 22   BUN 98* 92* 89*  CREATININE 2.57* 2.54* 2.32*  GLUCOSE 202* 203* 242*   Electrolytes  Recent Labs Lab 11/12/13 1725  11/13/13 0250 11/13/13 0620 11/13/13 1010  CALCIUM 9.9  9.8  < > 9.7 9.9 9.4  MG  --   --   --  2.2  --   PHOS 5.6*  --   --  5.1*  --   < > = values in this interval not displayed. Sepsis Markers  Recent Labs Lab 11/11/13 2334 11/11/13 2346  11/12/13 1400 11/13/13 0620  LATICACIDVEN  --  0.76  --   --   PROCALCITON 0.73  --  0.74 0.44   ABG No results found for this basename: PHART, PCO2ART, PO2ART,  in the last 168 hours Liver Enzymes  Recent Labs Lab 11/11/13 2334 11/12/13 1725  AST 22  --   ALT 57*  --   ALKPHOS 54  --   BILITOT 0.6  --   ALBUMIN 3.2* 2.9*   Cardiac Enzymes No results found for this basename: TROPONINI, PROBNP,  in the last 168 hours Glucose  Recent Labs Lab 11/12/13 0811 11/12/13 1248 11/12/13 1724 11/12/13 2034 11/12/13 2338 11/13/13 0750  GLUCAP 135* 153* 207* 233* 204* 196*    Imaging Ct Abdomen Pelvis Wo Contrast  11/12/2013   CLINICAL DATA:  Evaluate for recurrence of abscess  EXAM: CT ABDOMEN AND PELVIS WITHOUT CONTRAST  TECHNIQUE: Multidetector CT imaging of the abdomen and pelvis was performed following the  standard protocol without intravenous contrast.  COMPARISON:  08/14/2013  FINDINGS: BODY WALL: There is an ostomy in the right lower quadrant. Ostomy defect is relatively large in the anterior abdominal wall, status post mesh removal and abscess drainage. There is no fluid collection currently. 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 Contrast does not reach these segments of bowel to confirm the persistence of fistula. There is a distinct left lower quadrant ventral wall hernia which contains a knuckle of nonobstructed sigmoid colon. Bilateral fatty inguinal hernias.  LOWER CHEST: Unremarkable.  ABDOMEN/PELVIS:  Liver: No focal abnormality.  Biliary: Cholelithiasis. No gallbladder wall thickening or pericholecystic edema.  Pancreas: Unremarkable.  Spleen: Unremarkable.  Adrenals: Large, lobulated adrenal glands, likely from multiple adenomas.  Kidneys and ureters: No hydronephrosis or stone.  Bladder: Contains urine.  Reproductive: Unremarkable.  Bowel: No bowel obstruction.  Stoma and neighboring findings above.  Retroperitoneum: No mass or adenopathy.  Peritoneum: No free fluid or gas.  Vascular: No acute abnormality.  OSSEOUS: No acute abnormalities. Stable appearance of the degenerated symphysis pubis and SI joints. Bulky spondylosis in the lower thoracic spine.  IMPRESSION: 1. No recurrence of previously seen abdominal wall hernia. No intra-abdominal source of sepsis identified. 2. No hydronephrosis to explain renal failure. 3. Cholelithiasis.   Electronically Signed   By: Jorje Guild M.D.   On: 11/12/2013 04:32   Dg Chest Port 1 View  11/12/2013   CLINICAL DATA:  Status post central line placement  EXAM: PORTABLE CHEST - 1 VIEW  COMPARISON:  11/12/2013  FINDINGS: Cardiac shadow is stable. A pacing device is again seen. A new right-sided central venous line is seen with the catheter tip in the mid superior vena  cava. No pneumothorax is noted. The previously seen right-sided PICC line is been removed in the interval. The lungs remain clear.  IMPRESSION: Status post central line placement as described. No pneumothorax is noted.   Electronically Signed   By: Inez Catalina M.D.   On: 11/12/2013 12:37   Dg Chest Port 1 View  11/12/2013   CLINICAL DATA:  Weakness and body aches.  EXAM: PORTABLE CHEST - 1 VIEW  COMPARISON:  Single view of the chest 10/15/2013.  FINDINGS: Three-view pacing device in place. Right PICC is again seen. Lungs are clear. Heart size is mildly enlarged. No pneumothorax or pleural effusion.  IMPRESSION: No acute disease.   Electronically Signed   By: Inge Rise M.D.  On: 11/12/2013 00:10    ASSESSMENT / PLAN:  PULMONARY A: OSA, has not worn CPAP x 1 year P:   Retry auto-set CPAP   CARDIOVASCULAR A: Hypotension (resolved with fluids) .  Chronic CHF, EF 55% 11/14 AICD in place Afib P:  Fluid resuscitation gentle   RENAL A:  Acute renal failure with base creatine .90 and 2.91 on admit in setting of ace-i, lasix, aldactone, and K+ supplements.  Hyperkalemia P:   Per renal Kayexalate, insulin + d50+ ca biacrb gtt Dc lisinopril, aldactone & K supplements  GASTROINTESTINAL A: Post colostomy, short gut syndrome P:   TNA   HEMATOLOGIC A:  No acute issue P:    INFECTIOUS A:  Recent abdominal abscess with surgical intervention. P:   Follow cx data vanco + zosyn ordered 2/6  ENDOCRINE A:  DM P:   SSI    TODAY'S SUMMARY:  MO, post abdominal abscess post bowel resection, colostomy, short gut syndrome with new hyperkalemia, renal failure  PCCM to sign off  Kara Mead MD. [facility] Flood. [facility] Pager [phone] If no response call [phone]      11/13/2013, 11:42 AM

## 2013-11-13 NOTE — Progress Notes (Signed)
General Surgery Note  LOS: 2 days  POD -    90  Assessment/Plan: 1.  Infected prosthetic mesh of abdominal wall with GIANT abscess s/p removal - 08/14/2013 -  Dr. Michael Boston  Admitted 11/12/2013 for weakness, hyperkalemia, renal dysfunction  Vanc/Zosyn - 11/12/2013 >>>  2.  Morbid obesity 3.  Acute renal failure  Creat. - 2.32 - 11/13/2013  This is improving, UO good, Dr. Justin Mend is signing off for renal. 4.  Nutrition - TPN 5.  DVT prophylaxis - SQ Heparin   Principal Problem:   Severe sepsis with acute organ dysfunction Active Problems:   Diabetes mellitus, insulin dependent (IDDM), uncontrolled   Obesity, Class III, BMI 40-49.9 (morbid obesity)   OBSTRUCTIVE SLEEP APNEA   Infected prosthetic mesh of abdominal wall with GIANT abscess s/p removal 08/14/2013   Atrial fibrillation/flutter   Hyperkalemia   AKI (acute kidney injury)   Subjective:  Doing much better.  No specific complaint. Objective:   Filed Vitals:   11/13/13 1000  BP: 133/73  Pulse: 59  Temp:   Resp: 22     Intake/Output from previous day:  02/06 0701 - 02/07 0700 In: 4405 [P.O.:180; I.V.:2975; IV Piggyback:1250] Out: 3115 [Urine:1735; Stool:1380]  Intake/Output this shift:  Total I/O In: 935 [I.V.:435; IV Piggyback:500] Out: 450 [Urine:200; Stool:250]   Physical Exam:   General: Obese AA M who is alert and oriented.    HEENT: Normal. Pupils equal. .   Lungs: Clear.   Abdomen: Large.   Wound: With Eiken's pouch.  Generally clean.   Lab Results:    Recent Labs  11/11/13 2334  11/12/13 0149 11/13/13 0620  WBC 16.1*  --   --  12.3*  HGB 13.4  < > 13.3 11.7*  HCT 39.5  < > 39.0 35.0*  PLT 277  --   --  259  < > = values in this interval not displayed.  BMET   Recent Labs  11/13/13 0620 11/13/13 1010  NA 131* 130*  K 6.0* 5.5*  CL 98 94*  CO2 22 22  GLUCOSE 203* 242*  BUN 92* 89*  CREATININE 2.54* 2.32*  CALCIUM 9.9 9.4    PT/INR   Recent Labs  11/11/13 2334  LABPROT 15.9*   INR 1.30    ABG  No results found for this basename: PHART, PCO2, PO2, HCO3,  in the last 72 hours   Studies/Results:  Ct Abdomen Pelvis Wo Contrast  11/12/2013   CLINICAL DATA:  Evaluate for recurrence of abscess  EXAM: CT ABDOMEN AND PELVIS WITHOUT CONTRAST  TECHNIQUE: Multidetector CT imaging of the abdomen and pelvis was performed following the standard protocol without intravenous contrast.  COMPARISON:  08/14/2013  FINDINGS: BODY WALL: There is an ostomy in the right lower quadrant. Ostomy defect is relatively large in the anterior abdominal wall, status post mesh removal and abscess drainage. There is no fluid collection currently. 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 Contrast does not reach these segments of bowel to confirm the persistence of fistula. There is a distinct left lower quadrant ventral wall hernia which contains a knuckle of nonobstructed sigmoid colon. Bilateral fatty inguinal hernias.  LOWER CHEST: Unremarkable.  ABDOMEN/PELVIS:  Liver: No focal abnormality.  Biliary: Cholelithiasis. No gallbladder wall thickening or pericholecystic edema.  Pancreas: Unremarkable.  Spleen: Unremarkable.  Adrenals: Large, lobulated adrenal glands, likely from multiple adenomas.  Kidneys and ureters: No hydronephrosis or stone.  Bladder: Contains urine.  Reproductive: Unremarkable.  Bowel: No bowel obstruction.  Stoma and neighboring findings above.  Retroperitoneum: No mass or adenopathy.  Peritoneum: No free fluid or gas.  Vascular: No acute abnormality.  OSSEOUS: No acute abnormalities. Stable appearance of the degenerated symphysis pubis and SI joints. Bulky spondylosis in the lower thoracic spine.  IMPRESSION: 1. No recurrence of previously seen abdominal wall hernia. No intra-abdominal source of sepsis identified. 2. No hydronephrosis to explain renal failure. 3. Cholelithiasis.   Electronically Signed    By: Jorje Guild M.D.   On: 11/12/2013 04:32   Dg Chest Port 1 View  11/12/2013   CLINICAL DATA:  Status post central line placement  EXAM: PORTABLE CHEST - 1 VIEW  COMPARISON:  11/12/2013  FINDINGS: Cardiac shadow is stable. A pacing device is again seen. A new right-sided central venous line is seen with the catheter tip in the mid superior vena cava. No pneumothorax is noted. The previously seen right-sided PICC line is been removed in the interval. The lungs remain clear.  IMPRESSION: Status post central line placement as described. No pneumothorax is noted.   Electronically Signed   By: Inez Catalina M.D.   On: 11/12/2013 12:37   Dg Chest Port 1 View  11/12/2013   CLINICAL DATA:  Weakness and body aches.  EXAM: PORTABLE CHEST - 1 VIEW  COMPARISON:  Single view of the chest 10/15/2013.  FINDINGS: Three-view pacing device in place. Right PICC is again seen. Lungs are clear. Heart size is mildly enlarged. No pneumothorax or pleural effusion.  IMPRESSION: No acute disease.   Electronically Signed   By: Inge Rise M.D.   On: 11/12/2013 00:10     Anti-infectives:   Anti-infectives   Start     Dose/Rate Route Frequency Ordered Stop   11/13/13 0800  vancomycin (VANCOCIN) 2,000 mg in sodium chloride 0.9 % 500 mL IVPB     2,000 mg 250 mL/hr over 120 Minutes Intravenous Every 24 hours 11/12/13 0554     11/12/13 1400  piperacillin-tazobactam (ZOSYN) IVPB 3.375 g     3.375 g 12.5 mL/hr over 240 Minutes Intravenous 3 times per day 11/12/13 0553     11/12/13 0600  vancomycin (VANCOCIN) 2,250 mg in sodium chloride 0.9 % 500 mL IVPB     2,250 mg 250 mL/hr over 120 Minutes Intravenous  Once 11/12/13 0553 11/12/13 0800   11/12/13 0115  vancomycin (VANCOCIN) IVPB 1000 mg/200 mL premix  Status:  Discontinued     1,000 mg 200 mL/hr over 60 Minutes Intravenous  Once 11/12/13 0107 11/12/13 0553   11/12/13 0115  piperacillin-tazobactam (ZOSYN) IVPB 3.375 g     3.375 g 100 mL/hr over 30 Minutes  Intravenous STAT 11/12/13 0107 11/12/13 0510      Alphonsa Overall, MD, FACS Pager: [facility] Office: [phone] 11/13/2013

## 2013-11-13 NOTE — Progress Notes (Signed)
[facility] ROUNDING NOTE   Subjective:   Interval History: great urine output  Wants an air matress  Objective:  Vital signs in last 24 hours:  Temp:  [98.3 F (36.8 C)-98.7 F (37.1 C)] 98.4 F (36.9 C) (02/07 0800) Pulse Rate:  [59-79] 59 (02/07 1000) Resp:  [11-28] 22 (02/07 1000) BP: (90-142)/(37-74) 133/73 mmHg (02/07 1000) SpO2:  [95 %-100 %] 100 % (02/07 1000)  Weight change:  Filed Weights   11/12/13 0500 11/12/13 0659  Weight: 188 kg (414 lb 7.4 oz) 167 kg (368 lb 2.7 oz)    Intake/Output: I/O last 3 completed shifts: In: [id number] [P.O.:180; I.V.:2975; IV Piggyback:1250] Out: [id number] [Urine:1735; [id number]   Intake/Output this shift:  Total I/O In: 935 [I.V.:435; IV Piggyback:500] Out: 450 [Urine:200; Stool:250]        Lungs: Clear throughout to auscultation. No wheezes, crackles, or rhonchi. No acute distress.  Heart: Regular rate and rhythm; no murmurs, clicks, rubs, or gallops.  Abdomen: Large fatty pannus  Msk: Symmetrical without gross deformities. Normal posture.  Pulses: No carotid, renal, femoral bruits. DP and PT symmetrical and equal  Extremities: Without clubbing or edema.       Basic Metabolic Panel:  Recent Labs Lab 11/12/13 1725 11/12/13 2225 11/13/13 0250 11/13/13 0620 11/13/13 1010  NA 130*  129* 131* 130* 131* 130*  K 6.5*  6.4* 6.3* 5.7* 6.0* 5.5*  CL 99  98 99 98 98 94*  CO2 17*  17* 19 21 22 22   GLUCOSE 220*  221* 234* 202* 203* 242*  BUN 113*  113* 104* 98* 92* 89*  CREATININE 2.78*  2.77* 2.73* 2.57* 2.54* 2.32*  CALCIUM 9.9  9.8 10.0 9.7 9.9 9.4  MG  --   --   --  2.2  --   PHOS 5.6*  --   --  5.1*  --     Liver Function Tests:  Recent Labs Lab 11/11/13 2334 11/12/13 1725  AST 22  --   ALT 57*  --   ALKPHOS 54  --   BILITOT 0.6  --   PROT 8.9*  --   ALBUMIN 3.2* 2.9*   No results found for this basename: LIPASE, AMYLASE,  in the last 168 hours No results found for this basename:  AMMONIA,  in the last 168 hours  CBC:  Recent Labs Lab 11/11/13 2334 11/11/13 2346 11/12/13 0149 11/13/13 0620  WBC 16.1*  --   --  12.3*  NEUTROABS 11.8*  --   --   --   HGB 13.4 15.3 13.3 11.7*  HCT 39.5 45.0 39.0 35.0*  MCV 90.2  --   --  90.0  PLT 277  --   --  259    Cardiac Enzymes: No results found for this basename: CKTOTAL, CKMB, CKMBINDEX, TROPONINI,  in the last 168 hours  BNP: No components found with this basename: POCBNP,   CBG:  Recent Labs Lab 11/12/13 1248 11/12/13 1724 11/12/13 2034 11/12/13 2338 11/13/13 0750  GLUCAP 153* 207* 233* 204* 196*    Microbiology: Results for orders placed during the hospital encounter of 11/11/13  CATH TIP CULTURE     Status: None   Collection Time    11/12/13  2:53 AM      Result Value Range Status   Specimen Description CATH TIP   Final   Special Requests NONE   Final   Culture     Final   Value: NO GROWTH 1 DAY  Performed at [facility]   Report Status PENDING   Incomplete  MRSA PCR SCREENING     Status: None   Collection Time    11/12/13  5:34 AM      Result Value Range Status   MRSA by PCR NEGATIVE  NEGATIVE Final   Comment:            The GeneXpert MRSA Assay (FDA     approved for NASAL specimens     only), is one component of a     comprehensive MRSA colonization     surveillance program. It is not     intended to diagnose MRSA     infection nor to guide or     monitor treatment for     MRSA infections.    Coagulation Studies:  Recent Labs  11/11/13 2334  LABPROT 15.9*  INR 1.30    Urinalysis:  Recent Labs  11/13/13 0630  COLORURINE YELLOW  LABSPEC 1.015  PHURINE 5.0  GLUCOSEU NEGATIVE  HGBUR LARGE*  BILIRUBINUR NEGATIVE  KETONESUR NEGATIVE  PROTEINUR NEGATIVE  UROBILINOGEN 0.2  NITRITE NEGATIVE  LEUKOCYTESUR MODERATE*      Imaging: Ct Abdomen Pelvis Wo Contrast  11/12/2013   CLINICAL DATA:  Evaluate for recurrence of abscess  EXAM: CT ABDOMEN AND PELVIS  WITHOUT CONTRAST  TECHNIQUE: Multidetector CT imaging of the abdomen and pelvis was performed following the standard protocol without intravenous contrast.  COMPARISON:  08/14/2013  FINDINGS: BODY WALL: There is an ostomy in the right lower quadrant. Ostomy defect is relatively large in the anterior abdominal wall, status post mesh removal and abscess drainage. There is no fluid collection currently. 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 Contrast does not reach these segments of bowel to confirm the persistence of fistula. There is a distinct left lower quadrant ventral wall hernia which contains a knuckle of nonobstructed sigmoid colon. Bilateral fatty inguinal hernias.  LOWER CHEST: Unremarkable.  ABDOMEN/PELVIS:  Liver: No focal abnormality.  Biliary: Cholelithiasis. No gallbladder wall thickening or pericholecystic edema.  Pancreas: Unremarkable.  Spleen: Unremarkable.  Adrenals: Large, lobulated adrenal glands, likely from multiple adenomas.  Kidneys and ureters: No hydronephrosis or stone.  Bladder: Contains urine.  Reproductive: Unremarkable.  Bowel: No bowel obstruction.  Stoma and neighboring findings above.  Retroperitoneum: No mass or adenopathy.  Peritoneum: No free fluid or gas.  Vascular: No acute abnormality.  OSSEOUS: No acute abnormalities. Stable appearance of the degenerated symphysis pubis and SI joints. Bulky spondylosis in the lower thoracic spine.  IMPRESSION: 1. No recurrence of previously seen abdominal wall hernia. No intra-abdominal source of sepsis identified. 2. No hydronephrosis to explain renal failure. 3. Cholelithiasis.   Electronically Signed   By: Jorje Guild M.D.   On: 11/12/2013 04:32   Dg Chest Port 1 View  11/12/2013   CLINICAL DATA:  Status post central line placement  EXAM: PORTABLE CHEST - 1 VIEW  COMPARISON:  11/12/2013  FINDINGS: Cardiac shadow is stable. A pacing device is  again seen. A new right-sided central venous line is seen with the catheter tip in the mid superior vena cava. No pneumothorax is noted. The previously seen right-sided PICC line is been removed in the interval. The lungs remain clear.  IMPRESSION: Status post central line placement as described. No pneumothorax is noted.   Electronically Signed   By: Inez Catalina M.D.   On: 11/12/2013 12:37   Dg  Chest Port 1 View  11/12/2013   CLINICAL DATA:  Weakness and body aches.  EXAM: PORTABLE CHEST - 1 VIEW  COMPARISON:  Single view of the chest 10/15/2013.  FINDINGS: Three-view pacing device in place. Right PICC is again seen. Lungs are clear. Heart size is mildly enlarged. No pneumothorax or pleural effusion.  IMPRESSION: No acute disease.   Electronically Signed   By: Inge Rise M.D.   On: 11/12/2013 00:10     Medications:   . dextrose 5 % and 0.9% NaCl     . atorvastatin  40 mg Oral q1800  . carvedilol  3.125 mg Oral BID WC  . heparin  5,000 Units Subcutaneous Q8H  . insulin aspart  0-9 Units Subcutaneous Q4H  . OxyCODONE  20 mg Oral Q12H  . piperacillin-tazobactam (ZOSYN)  IV  3.375 g Intravenous Q8H  . sodium chloride  3 mL Intravenous Q12H  . vancomycin  2,000 mg Intravenous Q24H   oxyCODONE, oxyCODONE-acetaminophen  Assessment/ Plan:   1.Acute Kidney injury with severe sepsis with acute organ dysfunction with what appears to be massive volume depletion and use of ACE Potassium supplementation and Spirinolactone  2.Metabolic Acidosis IV resolved  Will switch IV fluids 3.Hyperkalemia resolved 4. Severe sepsis with acute organ dysfunction   Will sign off please reconsult as needed   LOS: 2 Kurt Cross W @TODAY @10 :49 AM

## 2013-11-13 NOTE — Progress Notes (Addendum)
PARENTERAL NUTRITION CONSULT NOTE - INITIAL  Pharmacy Consult for TPN Indication: Enterocutaneous fistula  No Known Allergies  Patient Measurements: Height: 5\' 11"  (180.3 cm) Weight: 368 lb 2.7 oz (167 kg) IBW/kg (Calculated) : 75.3 Adjusted Body Weight: 112kg  Vital Signs: Temp: 97.8 F (36.6 C) (02/07 1200) Temp src: Oral (02/07 1200) BP: 107/63 mmHg (02/07 1300) Pulse Rate: 59 (02/07 1300) Intake/Output from previous day: 02/06 0701 - 02/07 0700 In: 4405 [P.O.:180; I.V.:2975; IV Piggyback:1250] Out: 3115 [Urine:1735; Stool:1380] Intake/Output from this shift: Total I/O In: 1265 [I.V.:765; IV Piggyback:500] Out: 900 [Urine:500; Stool:400]  Labs:  Recent Labs  11/11/13 2334 11/11/13 2346 11/12/13 0149 11/13/13 0620  WBC 16.1*  --   --  12.3*  HGB 13.4 15.3 13.3 11.7*  HCT 39.5 45.0 39.0 35.0*  PLT 277  --   --  259  APTT 37  --   --   --   INR 1.30  --   --   --      Recent Labs  11/11/13 2334  11/12/13 1725  11/13/13 0250 11/13/13 0620 11/13/13 1010  NA 127*  < > 130*  129*  < > 130* 131* 130*  K 6.8*  < > 6.5*  6.4*  < > 5.7* 6.0* 5.5*  CL 97  < > 99  98  < > 98 98 94*  CO2 16*  < > 17*  17*  < > 21 22 22   GLUCOSE 202*  < > 220*  221*  < > 202* 203* 242*  BUN 121*  < > 113*  113*  < > 98* 92* 89*  CREATININE 2.77*  < > 2.78*  2.77*  < > 2.57* 2.54* 2.32*  CALCIUM 10.9*  < > 9.9  9.8  < > 9.7 9.9 9.4  MG  --   --   --   --   --  2.2  --   PHOS  --   --  5.6*  --   --  5.1*  --   PROT 8.9*  --   --   --   --   --   --   ALBUMIN 3.2*  --  2.9*  --   --   --   --   AST 22  --   --   --   --   --   --   ALT 57*  --   --   --   --   --   --   ALKPHOS 54  --   --   --   --   --   --   BILITOT 0.6  --   --   --   --   --   --   < > = values in this interval not displayed. Estimated Creatinine Clearance: 53.6 ml/min (by C-G formula based on Cr of 2.32).    Recent Labs  11/12/13 2034 11/12/13 2338 11/13/13 0750  GLUCAP 233* 204* 196*     Medical History: Past Medical History  Diagnosis Date  . Sleep apnea   . Diabetes mellitus   . Hypertension   . Hyperlipidemia   . CHF (congestive heart failure)   . Hernia   . AAA (abdominal aortic aneurysm)/ 4.5 cm ascending per CT angio 08/01/13 08/12/2013  . 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 Constipation, chronic 08/03/2013    Medications:  Scheduled:  . atorvastatin  40 mg  Oral q1800  . carvedilol  3.125 mg Oral BID WC  . heparin  5,000 Units Subcutaneous Q8H  . insulin aspart  0-9 Units Subcutaneous Q4H  . OxyCODONE  20 mg Oral Q12H  . piperacillin-tazobactam (ZOSYN)  IV  3.375 g Intravenous Q8H  . sodium chloride  10-40 mL Intracatheter Q12H  . sodium chloride  3 mL Intravenous Q12H  . sodium polystyrene  30 g Oral Once  . vancomycin  2,000 mg Intravenous Q24H    Insulin Requirements in the past 24 hours:  39 units on 2/6 while off TNA. TNA was providing 60 units/day.  Nutritional Goals:  Previous goals per RD: [id number] kcal/day, 160-180 grams/day protein, 3.3-3.5 L/day fluid.  Clinimix E 5/15 at goal rate 125 ml/hr (3L/day) with 20% lipids at 10 ml/hr to provide 150g protein and 2610 kcal daily. Due to TNA bag capacity, maximum volume is 3L per 24 hrs. This rate will meet >/= 90% of estimated needs.  Current Nutrition:  CL  IVF:  D5NS at 170ml/hr.  Assessment:  63yo M known to pharmacy from recent admission. Readmitted 2/5 with hyperkalemia, generalized weakness, suspected PICC site infection. On TNA at home per [facility] d/t abdominal abscess and EC fistula. TNA and PICC were removed. A R IJ was placed 2/6. Pharmacy is asked to resume TNA.  Labs:  Glucose: Hx DM. CBGs consistently elevated. Receiving Sens SSI q4h. Goal 90-180. Electrolytes: K elevated but improving. Na is low but stable. Phos elevated. Corr Ca 10.3. Corr Ca x Phos = 52. (Cannot exceed 55)  Renal: BUN/SCr elevated but improving.   LFTs: WNL on 2/5.  TGs: -  Prealbumin: 18.1(1/5)   Plan:  Start Clinimix 5/15 at 39ml/hr. NO ELECTROLYTES - as we are unable to customize electrolytes in premixed TNA product. 20% fat emulsion at 53ml/hr. Plan to advance as tolerated to the goal rate. TNA to contain standard multivitamins and trace elements. Reduce IVF to 48ml/hr when TNA starts. Add insulin to TNA at 20 units/L. Increase SSI to Moderate q4h.  TNA lab panels on Mondays & Thursdays. F/u daily.  Romeo Rabon, PharmD, pager [phone]. 11/13/2013,2:03 PM.

## 2013-11-13 NOTE — Progress Notes (Signed)
[facility] PROGRESS NOTE  Kurt Cross [id number] DOB: 1950/10/08 DOA: 11/11/2013 PCP: [name], MD  Asessment/Plan: sepsis syndrome/hypotension -S/P PICC line removal in ED-catheter tip cultures of her with no growth -Hypotension resolved s/p IVF, more likely secondary to hypovolemia -continue empiric Vanc and Zosyn pending cultures -Appreciate CCM input -surgery consulted for further recs re:  abd wound ARF with Hyperkalemia and metab acidosis -multifactorial- was on lasix, spironolactone, kcl supplements, ACEI and hypotensive on admit -above listed med dc'ed at this time -Patient still hyperkalemic with K. of 5.5, will given dose of Kayexalate -Creatinine gradually trending down with hydration, acidosis resolved>> renal signing off. Active Problems:  Diabetes mellitus, insulin dependent (IDDM), uncontrolled  -continue SSI for now Complicated abd wall abscess /Infected prosthetic mesh s/p I&D and removal 08/21/2013  -Appreciate surgery and input -Resume TNA and follow. chronic systolic CHF, status post AICD -monitor fluid status closely off diuretics d/t ARF, hypotension as above -Follow and increase Coreg back to outpatient dose if blood pressures remain stable -follow and resume/further manage accordingly pending clinical course OSA      Code Status: full Family Communication: none at bedside Disposition Plan: keep in Step down   Consultants:  CCM s/o 2/7  Renal s/o 2/7  CCS  Procedures:  none  Antibiotics:  Vanc started on 2/5  Zosyn started on 2/5  HPI/Subjective: Pt requesting overlay mattress, denies chest pain and no shortness of breath.  Objective: Filed Vitals:   11/13/13 1200  BP: 135/70  Pulse: 61  Temp: 97.8 F (36.6 C)  Resp: 14    Intake/Output Summary (Last 24 hours) at 11/13/13 1249 Last data filed at 11/13/13 1200  Gross per 24 hour  Intake   5490 ml  Output   2965 ml  Net   2525 ml   Filed Weights   11/12/13  0500 11/12/13 0659  Weight: 188 kg (414 lb 7.4 oz) 167 kg (368 lb 2.7 oz)    Exam:  General: easily aroused & oriented x 3 In NAD Cardiovascular: RRR, nl S1 s2 Respiratory: CTAB Abdomen: soft +BS mild tenderness/ND, no rebound, large supraumbilical wound clean base, dressing soaked with serous drainage. Brownish drainage infra umbilically, no masses palpable Extremities: No cyanosis and no edema    Data Reviewed: Basic Metabolic Panel:  Recent Labs Lab 11/12/13 1725 11/12/13 2225 11/13/13 0250 11/13/13 0620 11/13/13 1010  NA 130*  129* 131* 130* 131* 130*  K 6.5*  6.4* 6.3* 5.7* 6.0* 5.5*  CL 99  98 99 98 98 94*  CO2 17*  17* 19 21 22 22   GLUCOSE 220*  221* 234* 202* 203* 242*  BUN 113*  113* 104* 98* 92* 89*  CREATININE 2.78*  2.77* 2.73* 2.57* 2.54* 2.32*  CALCIUM 9.9  9.8 10.0 9.7 9.9 9.4  MG  --   --   --  2.2  --   PHOS 5.6*  --   --  5.1*  --    Liver Function Tests:  Recent Labs Lab 11/11/13 2334 11/12/13 1725  AST 22  --   ALT 57*  --   ALKPHOS 54  --   BILITOT 0.6  --   PROT 8.9*  --   ALBUMIN 3.2* 2.9*   No results found for this basename: LIPASE, AMYLASE,  in the last 168 hours No results found for this basename: AMMONIA,  in the last 168 hours CBC:  Recent Labs Lab 11/11/13 2334 11/11/13 2346 11/12/13 0149 11/13/13 0620  WBC 16.1*  --   --  12.3*  NEUTROABS 11.8*  --   --   --   HGB 13.4 15.3 13.3 11.7*  HCT 39.5 45.0 39.0 35.0*  MCV 90.2  --   --  90.0  PLT 277  --   --  259   Cardiac Enzymes: No results found for this basename: CKTOTAL, CKMB, CKMBINDEX, TROPONINI,  in the last 168 hours BNP (last 3 results)  Recent Labs  08/01/13 1649  PROBNP 481.4*   CBG:  Recent Labs Lab 11/12/13 1248 11/12/13 1724 11/12/13 2034 11/12/13 2338 11/13/13 0750  GLUCAP 153* 207* 233* 204* 196*    Recent Results (from the past 240 hour(s))  CULTURE, BLOOD (ROUTINE X 2)     Status: None   Collection Time    11/11/13 11:37 PM       Result Value Range Status   Specimen Description BLOOD RIGHT BICEP   Final   Special Requests BOTTLES DRAWN AEROBIC AND ANAEROBIC 5CC   Final   Culture  Setup Time     Final   Value: 11/12/2013 03:29     Performed at [facility]   Culture     Final   Value:        BLOOD CULTURE RECEIVED NO GROWTH TO DATE CULTURE WILL BE HELD FOR 5 DAYS BEFORE ISSUING A FINAL NEGATIVE REPORT     Performed at [facility]   Report Status PENDING   Incomplete  CULTURE, BLOOD (ROUTINE X 2)     Status: None   Collection Time    11/11/13 11:37 PM      Result Value Range Status   Specimen Description BLOOD LEFT HAND   Final   Special Requests BOTTLES DRAWN AEROBIC AND ANAEROBIC 4CC   Final   Culture  Setup Time     Final   Value: 11/12/2013 03:28     Performed at [facility]   Culture     Final   Value:        BLOOD CULTURE RECEIVED NO GROWTH TO DATE CULTURE WILL BE HELD FOR 5 DAYS BEFORE ISSUING A FINAL NEGATIVE REPORT     Performed at [facility]   Report Status PENDING   Incomplete  CATH TIP CULTURE     Status: None   Collection Time    11/12/13  2:53 AM      Result Value Range Status   Specimen Description CATH TIP   Final   Special Requests NONE   Final   Culture     Final   Value: NO GROWTH 1 DAY     Performed at [facility]   Report Status PENDING   Incomplete  MRSA PCR SCREENING     Status: None   Collection Time    11/12/13  5:34 AM      Result Value Range Status   MRSA by PCR NEGATIVE  NEGATIVE Final   Comment:            The GeneXpert MRSA Assay (FDA     approved for NASAL specimens     only), is one component of a     comprehensive MRSA colonization     surveillance program. It is not     intended to diagnose MRSA     infection nor to guide or     monitor treatment for     MRSA infections.     Studies: Ct Abdomen Pelvis Wo Contrast  11/12/2013   CLINICAL DATA:  Evaluate  for recurrence of abscess  EXAM: CT ABDOMEN AND PELVIS WITHOUT  CONTRAST  TECHNIQUE: Multidetector CT imaging of the abdomen and pelvis was performed following the standard protocol without intravenous contrast.  COMPARISON:  08/14/2013  FINDINGS: BODY WALL: There is an ostomy in the right lower quadrant. Ostomy defect is relatively large in the anterior abdominal wall, status post mesh removal and abscess drainage. There is no fluid collection currently. 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 Contrast does not reach these segments of bowel to confirm the persistence of fistula. There is a distinct left lower quadrant ventral wall hernia which contains a knuckle of nonobstructed sigmoid colon. Bilateral fatty inguinal hernias.  LOWER CHEST: Unremarkable.  ABDOMEN/PELVIS:  Liver: No focal abnormality.  Biliary: Cholelithiasis. No gallbladder wall thickening or pericholecystic edema.  Pancreas: Unremarkable.  Spleen: Unremarkable.  Adrenals: Large, lobulated adrenal glands, likely from multiple adenomas.  Kidneys and ureters: No hydronephrosis or stone.  Bladder: Contains urine.  Reproductive: Unremarkable.  Bowel: No bowel obstruction.  Stoma and neighboring findings above.  Retroperitoneum: No mass or adenopathy.  Peritoneum: No free fluid or gas.  Vascular: No acute abnormality.  OSSEOUS: No acute abnormalities. Stable appearance of the degenerated symphysis pubis and SI joints. Bulky spondylosis in the lower thoracic spine.  IMPRESSION: 1. No recurrence of previously seen abdominal wall hernia. No intra-abdominal source of sepsis identified. 2. No hydronephrosis to explain renal failure. 3. Cholelithiasis.   Electronically Signed   By: Jorje Guild M.D.   On: 11/12/2013 04:32   Dg Chest Port 1 View  11/12/2013   CLINICAL DATA:  Status post central line placement  EXAM: PORTABLE CHEST - 1 VIEW  COMPARISON:  11/12/2013  FINDINGS: Cardiac shadow is stable. A pacing device is again  seen. A new right-sided central venous line is seen with the catheter tip in the mid superior vena cava. No pneumothorax is noted. The previously seen right-sided PICC line is been removed in the interval. The lungs remain clear.  IMPRESSION: Status post central line placement as described. No pneumothorax is noted.   Electronically Signed   By: Inez Catalina M.D.   On: 11/12/2013 12:37   Dg Chest Port 1 View  11/12/2013   CLINICAL DATA:  Weakness and body aches.  EXAM: PORTABLE CHEST - 1 VIEW  COMPARISON:  Single view of the chest 10/15/2013.  FINDINGS: Three-view pacing device in place. Right PICC is again seen. Lungs are clear. Heart size is mildly enlarged. No pneumothorax or pleural effusion.  IMPRESSION: No acute disease.   Electronically Signed   By: Inge Rise M.D.   On: 11/12/2013 00:10    Scheduled Meds: . atorvastatin  40 mg Oral q1800  . carvedilol  3.125 mg Oral BID WC  . heparin  5,000 Units Subcutaneous Q8H  . insulin aspart  0-9 Units Subcutaneous Q4H  . OxyCODONE  20 mg Oral Q12H  . piperacillin-tazobactam (ZOSYN)  IV  3.375 g Intravenous Q8H  . sodium chloride  3 mL Intravenous Q12H  . sodium polystyrene  30 g Oral Once  . vancomycin  2,000 mg Intravenous Q24H   Continuous Infusions: . dextrose 5 % and 0.9% NaCl 125 mL/hr at 11/13/13 1200    Principal Problem:   Severe sepsis with acute organ dysfunction Active Problems:   Diabetes mellitus, insulin dependent (IDDM), uncontrolled   Obesity, Class III, BMI 40-49.9 (morbid obesity)   OBSTRUCTIVE SLEEP APNEA  Infected prosthetic mesh of abdominal wall with GIANT abscess s/p removal 08/14/2013   Atrial fibrillation/flutter   Hyperkalemia   AKI (acute kidney injury)   Time spent:35    Maddilynn Esperanza C  [facility] Pager [phone]. If 7PM-7AM, please contact night-coverage at [email or web address], password [facility] 11/13/2013, 12:49 PM  LOS: 2 days

## 2013-11-14 DIAGNOSIS — T8579XA Infection and inflammatory reaction due to other internal prosthetic devices, implants and grafts, initial encounter: Secondary | ICD-10-CM

## 2013-11-14 DIAGNOSIS — R7309 Other abnormal glucose: Secondary | ICD-10-CM

## 2013-11-14 LAB — COMPREHENSIVE METABOLIC PANEL
ALK PHOS: 42 U/L (ref 39–117)
ALT: 111 U/L — ABNORMAL HIGH (ref 0–53)
AST: 53 U/L — ABNORMAL HIGH (ref 0–37)
Albumin: 2.7 g/dL — ABNORMAL LOW (ref 3.5–5.2)
BILIRUBIN TOTAL: 0.6 mg/dL (ref 0.3–1.2)
BUN: 62 mg/dL — ABNORMAL HIGH (ref 6–23)
CHLORIDE: 102 meq/L (ref 96–112)
CO2: 25 mEq/L (ref 19–32)
Calcium: 9.3 mg/dL (ref 8.4–10.5)
Creatinine, Ser: 1.91 mg/dL — ABNORMAL HIGH (ref 0.50–1.35)
GFR calc Af Amer: 42 mL/min — ABNORMAL LOW (ref >90)
GFR, EST NON AFRICAN AMERICAN: 37 mL/min — AB (ref >90)
GLUCOSE: 232 mg/dL — AB (ref 70–99)
Potassium: 5.3 mEq/L (ref 3.7–5.3)
Sodium: 136 mEq/L — ABNORMAL LOW (ref 137–147)
TOTAL PROTEIN: 7.3 g/dL (ref 6.0–8.3)

## 2013-11-14 LAB — MAGNESIUM: Magnesium: 2 mg/dL (ref 1.5–2.5)

## 2013-11-14 LAB — BASIC METABOLIC PANEL
BUN: 66 mg/dL — ABNORMAL HIGH (ref 6–23)
CO2: 23 mEq/L (ref 19–32)
Calcium: 9 mg/dL (ref 8.4–10.5)
Chloride: 98 mEq/L (ref 96–112)
Creatinine, Ser: 1.91 mg/dL — ABNORMAL HIGH (ref 0.50–1.35)
GFR calc Af Amer: 42 mL/min — ABNORMAL LOW (ref >90)
GFR calc non Af Amer: 37 mL/min — ABNORMAL LOW (ref >90)
Glucose, Bld: 239 mg/dL — ABNORMAL HIGH (ref 70–99)
Potassium: 5.1 mEq/L (ref 3.7–5.3)
Sodium: 132 mEq/L — ABNORMAL LOW (ref 137–147)

## 2013-11-14 LAB — URINE CULTURE
CULTURE: NO GROWTH
Colony Count: NO GROWTH

## 2013-11-14 LAB — DIFFERENTIAL
BASOS PCT: 0 % (ref 0–1)
Basophils Absolute: 0 10*3/uL (ref 0.0–0.1)
EOS PCT: 3 % (ref 0–5)
Eosinophils Absolute: 0.3 10*3/uL (ref 0.0–0.7)
Lymphocytes Relative: 22 % (ref 12–46)
Lymphs Abs: 2.2 10*3/uL (ref 0.7–4.0)
MONO ABS: 1.1 10*3/uL — AB (ref 0.1–1.0)
Monocytes Relative: 11 % (ref 3–12)
Neutro Abs: 6.5 10*3/uL (ref 1.7–7.7)
Neutrophils Relative %: 64 % (ref 43–77)

## 2013-11-14 LAB — GLUCOSE, CAPILLARY
Glucose-Capillary: 205 mg/dL — ABNORMAL HIGH (ref 70–99)
Glucose-Capillary: 213 mg/dL — ABNORMAL HIGH (ref 70–99)
Glucose-Capillary: 243 mg/dL — ABNORMAL HIGH (ref 70–99)
Glucose-Capillary: 242 mg/dL — ABNORMAL HIGH (ref 70–99)
Glucose-Capillary: 223 mg/dL — ABNORMAL HIGH (ref 70–99)

## 2013-11-14 LAB — TRIGLYCERIDES: Triglycerides: 79 mg/dL (ref <150)

## 2013-11-14 LAB — CATH TIP CULTURE: Culture: NO GROWTH

## 2013-11-14 LAB — CBC
HEMATOCRIT: 34 % — AB (ref 39.0–52.0)
Hemoglobin: 11.2 g/dL — ABNORMAL LOW (ref 13.0–17.0)
MCH: 30 pg (ref 26.0–34.0)
MCHC: 32.9 g/dL (ref 30.0–36.0)
MCV: 91.2 fL (ref 78.0–100.0)
Platelets: 238 10*3/uL (ref 150–400)
RBC: 3.73 MIL/uL — ABNORMAL LOW (ref 4.22–5.81)
RDW: 14.5 % (ref 11.5–15.5)
WBC: 10.1 10*3/uL (ref 4.0–10.5)

## 2013-11-14 LAB — PROCALCITONIN: Procalcitonin: 0.22 ng/mL

## 2013-11-14 LAB — PHOSPHORUS: Phosphorus: 3.9 mg/dL (ref 2.3–4.6)

## 2013-11-14 LAB — PREALBUMIN: PREALBUMIN: 21.3 mg/dL (ref 17.0–34.0)

## 2013-11-14 MED ORDER — DEXTROSE-NACL 5-0.9 % IV SOLN
INTRAVENOUS | Status: DC
  Administered 2013-11-14 – 2013-11-15 (×2): via INTRAVENOUS

## 2013-11-14 MED ORDER — TRACE MINERALS CR-CU-F-FE-I-MN-MO-SE-ZN IV SOLN
INTRAVENOUS | Status: AC
  Administered 2013-11-14: 18:00:00 via INTRAVENOUS
  Filled 2013-11-14: qty 2000

## 2013-11-14 MED ORDER — CARVEDILOL 6.25 MG PO TABS
6.2500 mg | ORAL_TABLET | Freq: Two times a day (BID) | ORAL | Status: DC
  Administered 2013-11-14 – 2013-11-18 (×8): 6.25 mg via ORAL
  Filled 2013-11-14 (×11): qty 1

## 2013-11-14 MED ORDER — INSULIN ASPART 100 UNIT/ML ~~LOC~~ SOLN
0.0000 [IU] | SUBCUTANEOUS | Status: DC
  Administered 2013-11-14 (×3): 7 [IU] via SUBCUTANEOUS
  Administered 2013-11-15: 4 [IU] via SUBCUTANEOUS
  Administered 2013-11-15: 11 [IU] via SUBCUTANEOUS
  Administered 2013-11-15: 7 [IU] via SUBCUTANEOUS
  Administered 2013-11-15: 4 [IU] via SUBCUTANEOUS
  Administered 2013-11-15 (×2): 7 [IU] via SUBCUTANEOUS
  Administered 2013-11-16 (×4): 4 [IU] via SUBCUTANEOUS
  Administered 2013-11-16 (×2): 7 [IU] via SUBCUTANEOUS
  Administered 2013-11-17: 3 [IU] via SUBCUTANEOUS
  Administered 2013-11-17 (×2): 4 [IU] via SUBCUTANEOUS

## 2013-11-14 MED ORDER — FAT EMULSION 20 % IV EMUL
250.0000 mL | INTRAVENOUS | Status: AC
  Administered 2013-11-14: 250 mL via INTRAVENOUS
  Filled 2013-11-14: qty 250

## 2013-11-14 NOTE — Progress Notes (Signed)
Patient transferring to room 1405.  Report called to [name], RN.  Patient to travel by bed.  Will continue to monitor.

## 2013-11-14 NOTE — Progress Notes (Signed)
RT placed pt on CPAP. Patient settings are auto titrate with 2L of oxygen bleed in. Water chamber is filled for humidifiction. Patient tolerating well at this time.

## 2013-11-14 NOTE — Progress Notes (Addendum)
[facility] PROGRESS NOTE  Kurt Cross [id number] DOB: [birth year] DOA: 11/11/2013 PCP: [name], MD  Asessment/Plan: sepsis syndrome/hypotension -S/P PICC line removal in ED-catheter tip cultures of her with no growth -Hypotension resolved s/p IVF, more likely secondary to hypovolemia -blood and catheter tip cultures neg, continue empiric Vanc and Zosyn for now pending surgery input on if pt needs continued abx for abdominal wound  -surgery following re:  abd wound ARF with Hyperkalemia and metab acidosis -multifactorial- was on lasix, spironolactone, kcl supplements, ACEI and hypotensive on admit -above listed med dc'ed at this time -Creatinine continues to trend down with hydration, acidosis resolved>> renal signed off 2/7. -Hyperkalemia resolved Active Problems:  Diabetes mellitus, insulin dependent (IDDM), uncontrolled  -continue SSI for now Complicated abd wall abscess /Infected prosthetic mesh s/p I&D and removal 08/21/2013  -Surgery to follow and advise on if patient needs continued antibiotics for wound -Appreciate surgery  -Resume TNA and follow. chronic systolic CHF, status post AICD -Continue monitor fluid status closely off diuretics d/t ARF, compensated at this time -Coreg increased back to outpatient dose as blood pressures remain stable -follow and resume/further manage accordingly pending clinical course OSA  Afib/Flutter -rate controlled, continue coreg    Code Status: full Family Communication: none at bedside Disposition Plan: Transfer to telemetry   Consultants:  CCM s/o 2/7  Renal s/o 2/7  CCS  Procedures:  none  Antibiotics:  Vanc started on 2/5  Zosyn started on 2/5  HPI/Subjective: Feels much better this a.m., denies nausea vomiting no chest pain no shortness of breath  Objective: Filed Vitals:   11/14/13 0900  BP: 151/72  Pulse: 60  Temp:   Resp: 17    Intake/Output Summary (Last 24 hours) at 11/14/13 0941 Last  data filed at 11/14/13 0900  Gross per 24 hour  Intake   4475 ml  Output   5450 ml  Net   -975 ml   Filed Weights   11/12/13 0500 11/12/13 0659  Weight: 188 kg (414 lb 7.4 oz) 167 kg (368 lb 2.7 oz)    Exam:  General: easily aroused & oriented x 3 In NAD Cardiovascular: RRR, nl S1 s2 Respiratory: CTAB             Abdomen: soft +BS mild tenderness/ND, abd wound with Eiken's pouch, appears clean  Extremities: No cyanosis and no edema    Data Reviewed: Basic Metabolic Panel:  Recent Labs Lab 11/12/13 1725  11/13/13 0620  11/13/13 1425 11/13/13 1820 11/13/13 2230 11/14/13 0205 11/14/13 0500  NA 130*  129*  < > 131*  < > 136* 135* 133* 132* 136*  K 6.5*  6.4*  < > 6.0*  < > 5.1 5.6* 5.4* 5.1 5.3  CL 99  98  < > 98  < > 104 99 98 98 102  CO2 17*  17*  < > 22  < > 20 23 24 23 25   GLUCOSE 220*  221*  < > 203*  < > 245* 293* 281* 239* 232*  BUN 113*  113*  < > 92*  < > 71* 74* 68* 66* 62*  CREATININE 2.78*  2.77*  < > 2.54*  < > 1.88* 2.13* 2.03* 1.91* 1.91*  CALCIUM 9.9  9.8  < > 9.9  < > 8.1* 9.6 9.3 9.0 9.3  MG  --   --  2.2  --   --   --   --   --  2.0  PHOS 5.6*  --  5.1*  --   --   --   --   --  3.9  < > = values in this interval not displayed. Liver Function Tests:  Recent Labs Lab 11/11/13 2334 11/12/13 1725 11/14/13 0500  AST 22  --  53*  ALT 57*  --  111*  ALKPHOS 54  --  42  BILITOT 0.6  --  0.6  PROT 8.9*  --  7.3  ALBUMIN 3.2* 2.9* 2.7*   No results found for this basename: LIPASE, AMYLASE,  in the last 168 hours No results found for this basename: AMMONIA,  in the last 168 hours CBC:  Recent Labs Lab 11/11/13 2334 11/11/13 2346 11/12/13 0149 11/13/13 0620 11/14/13 0500  WBC 16.1*  --   --  12.3* 10.1  NEUTROABS 11.8*  --   --   --  6.5  HGB 13.4 15.3 13.3 11.7* 11.2*  HCT 39.5 45.0 39.0 35.0* 34.0*  MCV 90.2  --   --  90.0 91.2  PLT 277  --   --  259 238   Cardiac Enzymes: No results found for this basename: CKTOTAL, CKMB,  CKMBINDEX, TROPONINI,  in the last 168 hours BNP (last 3 results)  Recent Labs  08/01/13 1649  PROBNP 481.4*   CBG:  Recent Labs Lab 11/13/13 1609 11/13/13 2058 11/13/13 2351 11/14/13 0514 11/14/13 0815  GLUCAP 292* 312* 253* 205* 213*    Recent Results (from the past 240 hour(s))  CULTURE, BLOOD (ROUTINE X 2)     Status: None   Collection Time    11/11/13 11:37 PM      Result Value Range Status   Specimen Description BLOOD RIGHT BICEP   Final   Special Requests BOTTLES DRAWN AEROBIC AND ANAEROBIC 5CC   Final   Culture  Setup Time     Final   Value: 11/12/2013 03:29     Performed at [facility]   Culture     Final   Value:        BLOOD CULTURE RECEIVED NO GROWTH TO DATE CULTURE WILL BE HELD FOR 5 DAYS BEFORE ISSUING A FINAL NEGATIVE REPORT     Performed at [facility]   Report Status PENDING   Incomplete  CULTURE, BLOOD (ROUTINE X 2)     Status: None   Collection Time    11/11/13 11:37 PM      Result Value Range Status   Specimen Description BLOOD LEFT HAND   Final   Special Requests BOTTLES DRAWN AEROBIC AND ANAEROBIC 4CC   Final   Culture  Setup Time     Final   Value: 11/12/2013 03:28     Performed at [facility]   Culture     Final   Value:        BLOOD CULTURE RECEIVED NO GROWTH TO DATE CULTURE WILL BE HELD FOR 5 DAYS BEFORE ISSUING A FINAL NEGATIVE REPORT     Performed at [facility]   Report Status PENDING   Incomplete  CATH TIP CULTURE     Status: None   Collection Time    11/12/13  2:53 AM      Result Value Range Status   Specimen Description CATH TIP   Final   Special Requests NONE   Final   Culture     Final   Value: NO GROWTH 2 DAYS     Performed at [facility]   Report Status 11/14/2013 FINAL   Final  MRSA PCR SCREENING     Status: None   Collection Time    11/12/13  5:34 AM      Result Value Range Status   MRSA by PCR NEGATIVE  NEGATIVE Final   Comment:            The GeneXpert MRSA Assay (FDA      approved for NASAL specimens     only), is one component of a     comprehensive MRSA colonization     surveillance program. It is not     intended to diagnose MRSA     infection nor to guide or     monitor treatment for     MRSA infections.     Studies: Dg Chest Port 1 View  11/12/2013   CLINICAL DATA:  Status post central line placement  EXAM: PORTABLE CHEST - 1 VIEW  COMPARISON:  11/12/2013  FINDINGS: Cardiac shadow is stable. A pacing device is again seen. A new right-sided central venous line is seen with the catheter tip in the mid superior vena cava. No pneumothorax is noted. The previously seen right-sided PICC line is been removed in the interval. The lungs remain clear.  IMPRESSION: Status post central line placement as described. No pneumothorax is noted.   Electronically Signed   By: Inez Catalina M.D.   On: 11/12/2013 12:37    Scheduled Meds: . atorvastatin  40 mg Oral q1800  . carvedilol  3.125 mg Oral BID WC  . heparin  5,000 Units Subcutaneous Q8H  . insulin aspart  0-15 Units Subcutaneous Q4H  . OxyCODONE  20 mg Oral Q12H  . piperacillin-tazobactam (ZOSYN)  IV  3.375 g Intravenous Q8H  . sodium chloride  10-40 mL Intracatheter Q12H  . sodium chloride  3 mL Intravenous Q12H  . vancomycin  2,000 mg Intravenous Q24H   Continuous Infusions: . TPN (CLINIMIX) Adult without lytes 80 mL/hr at 11/13/13 1755   And  . fat emulsion 250 mL (11/13/13 1754)    Principal Problem:   Severe sepsis with acute organ dysfunction Active Problems:   Diabetes mellitus, insulin dependent (IDDM), uncontrolled   Obesity, Class III, BMI 40-49.9 (morbid obesity)   OBSTRUCTIVE SLEEP APNEA   Infected prosthetic mesh of abdominal wall with GIANT abscess s/p removal 08/14/2013   Atrial fibrillation/flutter   Hyperkalemia   AKI (acute kidney injury)   Time spent:25    Stella Bortle C  [facility] Pager [phone]. If 7PM-7AM, please contact night-coverage at [email or web address], password  [facility] 11/14/2013, 9:41 AM  LOS: 3 days

## 2013-11-14 NOTE — Progress Notes (Signed)
PARENTERAL NUTRITION CONSULT NOTE - Follow up  Pharmacy Consult for TPN Indication: Enterocutaneous fistula  No Known Allergies  Patient Measurements: Height: 5\' 11"  (180.3 cm) Weight: 368 lb 2.7 oz (167 kg) IBW/kg (Calculated) : 75.3 Adjusted Body Weight: 112kg  Vital Signs: Temp: 98.1 F (36.7 C) (02/08 0800) Temp src: Oral (02/08 0800) BP: 154/70 mmHg (02/08 1000) Pulse Rate: 60 (02/08 1000) Intake/Output from previous day: 02/07 0701 - 02/08 0700 In: 4912.5 [P.O.:840; I.V.:2110; IV Piggyback:662.5; TPN:1300] Out: 5300 [Urine:2850; Stool:2450]  Labs:  Recent Labs  11/11/13 2334  11/12/13 0149 11/13/13 0620 11/14/13 0500  WBC 16.1*  --   --  12.3* 10.1  HGB 13.4  < > 13.3 11.7* 11.2*  HCT 39.5  < > 39.0 35.0* 34.0*  PLT 277  --   --  259 238  APTT 37  --   --   --   --   INR 1.30  --   --   --   --   < > = values in this interval not displayed.   Recent Labs  11/11/13 2334  11/12/13 1725  11/13/13 0620  11/13/13 2230 11/14/13 0205 11/14/13 0500  NA 127*  < > 130*  129*  < > 131*  < > 133* 132* 136*  K 6.8*  < > 6.5*  6.4*  < > 6.0*  < > 5.4* 5.1 5.3  CL 97  < > 99  98  < > 98  < > 98 98 102  CO2 16*  < > 17*  17*  < > 22  < > 24 23 25   GLUCOSE 202*  < > 220*  221*  < > 203*  < > 281* 239* 232*  BUN 121*  < > 113*  113*  < > 92*  < > 68* 66* 62*  CREATININE 2.77*  < > 2.78*  2.77*  < > 2.54*  < > 2.03* 1.91* 1.91*  CALCIUM 10.9*  < > 9.9  9.8  < > 9.9  < > 9.3 9.0 9.3  MG  --   --   --   --  2.2  --   --   --  2.0  PHOS  --   --  5.6*  --  5.1*  --   --   --  3.9  PROT 8.9*  --   --   --   --   --   --   --  7.3  ALBUMIN 3.2*  --  2.9*  --   --   --   --   --  2.7*  AST 22  --   --   --   --   --   --   --  53*  ALT 57*  --   --   --   --   --   --   --  111*  ALKPHOS 54  --   --   --   --   --   --   --  42  BILITOT 0.6  --   --   --   --   --   --   --  0.6  < > = values in this interval not displayed. Estimated Creatinine Clearance: 65.2  ml/min (by C-G formula based on Cr of 1.91).    Recent Labs  11/13/13 2351 11/14/13 0514 11/14/13 0815  GLUCAP 253* 205* 213*    Insulin Requirements in the past 24  hours:  CBGs range 196 - 312 37 units of moderate SSI q4h. 40 units/ 2L TNA.  PTA insulin included:  NPH 40 units BID, Novolog 12 units BID, Regular insulin in TPN 60 units/3L/24 hrs.  Nutritional Goals:  Previous goals per RD: [id number] kcal/day, 160-180 grams/day protein, 3.3-3.5 L/day fluid.  Clinimix E 5/15 at goal rate 125 ml/hr (3L/day) with 20% lipids at 10 ml/hr to provide 150g protein and 2610 kcal daily. Due to TNA bag capacity, maximum volume is 3L per 24 hrs. This rate will meet >/= 90% of estimated needs.  Current Nutrition:  Diet: CL IVF:  D5NS @ 40 ml/hr. TNA: Clinimix 5/15 @ 80 ml/hr, Lipids @ 10 ml/hr  Assessment:  63yo M known to pharmacy from recent admission. Readmitted 2/5 with hyperkalemia, generalized weakness, suspected PICC site infection. On TNA at home per [facility] d/t abdominal abscess and EC fistula. TNA and PICC were removed on admission.  Central access with right IJ was placed 2/6. Pharmacy is asked to resume TNA on 2/7.  Labs:  Glucose: Hx DM on insulin with previous TPN. CBGs consistently elevated, even prior to TPN restarting at 1800 on 2/7. Receiving SSI and insulin in TPN, will cont to titrate.  Goal 90-180.  Electrolytes: Na is low but improved.  K is WNL, but at upper limit.  Phos, Mag, and others WNL. Renal: BUN/SCr elevated but improving.  LFTs: AST and ALT elevated on 2/8.  TGs: 67 (1/5) Prealbumin: 18.1(1/5)    Plan:  Continue Clinimix 5/15 at 31ml/hr. NO ELECTROLYTES.  Do not advance rate due to hyperglycemia. Continue 20% fat emulsion at 37ml/hr. TNA to contain standard multivitamins and trace elements. Continue IVF at 53ml/hr. Increase regular insulin in TNA to 30 units/L. (60 units/2L bag) Increase SSI to resistant scale q4h.  TNA lab panels on Mondays &  Thursdays. F/u daily.  Gretta Arab PharmD, BCPS Pager [phone] 11/14/2013 10:12 AM

## 2013-11-14 NOTE — Progress Notes (Signed)
General Surgery Note  LOS: 3 days  POD -    91  Assessment/Plan: 1.  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 Michael Boston  Admitted 11/12/2013 for weakness, hyperkalemia, renal dysfunction  WBC - 10,100 - 11/14/2013  Vanc/Zosyn - 11/12/2013 >>>  Has Eiken's pouch which seems to be working  He is scheduled to go to and be seen in [place] ([place] for his abdominal wall hernia/wound/enterocutaneous fistula on Wed, 11/17/2013.  2.  Morbid obesity 3.  Acute renal failure  Creat. - 1.91 - 11/14/2013  This is improving, UO good, Dr. Justin Mend is signed off for renal. 4.  Nutrition - TPN 5.  DVT prophylaxis - SQ Heparin 6.  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  No specific complaint. Objective:   Filed Vitals:   11/14/13 0700  BP: 153/65  Pulse: 60  Temp:   Resp: 19     Intake/Output from previous day:  02/07 0701 - 02/08 0700 In: 4900 [P.O.:840; I.V.:2110; IV Piggyback:650; TPN:1300] Out: 5300 [Urine:2850; Stool:2450]  Intake/Output this shift:      Physical Exam:   General: Obese AA M who is alert and oriented.    HEENT: Normal. Pupils equal. .   Lungs: Clear.   Abdomen: Large.   Wound: With Eiken's pouch.  Generally clean.   Lab Results:     Recent Labs  11/13/13 0620 11/14/13 0500  WBC 12.3* 10.1  HGB 11.7* 11.2*  HCT 35.0* 34.0*  PLT 259 238    BMET    Recent Labs  11/14/13 0205 11/14/13 0500  NA 132* 136*  K 5.1 5.3  CL 98 102  CO2 23 25  GLUCOSE 239* 232*  BUN 66* 62*  CREATININE 1.91* 1.91*   CALCIUM 9.0 9.3    PT/INR    Recent Labs  11/11/13 2334  LABPROT 15.9*  INR 1.30    ABG  No results found for this basename: PHART, PCO2, PO2, HCO3,  in the last 72 hours   Studies/Results:  Dg Chest Port 1 View  11/12/2013   CLINICAL DATA:  Status post central line placement  EXAM: PORTABLE CHEST - 1 VIEW  COMPARISON:  11/12/2013  FINDINGS: Cardiac shadow is stable. A pacing device is again seen. A new right-sided central venous line is seen with the catheter tip in the mid superior vena cava. No pneumothorax is noted. The previously seen right-sided PICC line is been removed in the interval. The lungs remain clear.  IMPRESSION: Status post central line placement as described. No pneumothorax is noted.   Electronically Signed   By: Inez Catalina M.D.   On: 11/12/2013 12:37     Anti-infectives:   Anti-infectives   Start     Dose/Rate Route Frequency Ordered Stop   11/13/13 0800  vancomycin (VANCOCIN) 2,000 mg in sodium chloride 0.9 % 500 mL IVPB     2,000 mg 250 mL/hr over 120 Minutes Intravenous Every 24 hours 11/12/13 0554     11/12/13 1400  piperacillin-tazobactam (ZOSYN) IVPB 3.375 g  3.375 g 12.5 mL/hr over 240 Minutes Intravenous 3 times per day 11/12/13 0553     11/12/13 0600  vancomycin (VANCOCIN) 2,250 mg in sodium chloride 0.9 % 500 mL IVPB     2,250 mg 250 mL/hr over 120 Minutes Intravenous  Once 11/12/13 0553 11/12/13 0800   11/12/13 0115  vancomycin (VANCOCIN) IVPB 1000 mg/200 mL premix  Status:  Discontinued     1,000 mg 200 mL/hr over 60 Minutes Intravenous  Once 11/12/13 0107 11/12/13 0553   11/12/13 0115  piperacillin-tazobactam (ZOSYN) IVPB 3.375 g     3.375 g 100 mL/hr over 30 Minutes Intravenous STAT 11/12/13 0107 11/12/13 0510      Alphonsa Overall, MD, FACS Pager: [facility] Office: [phone] 11/14/2013

## 2013-11-15 ENCOUNTER — Encounter (HOSPITAL_COMMUNITY): Payer: Self-pay | Admitting: Student

## 2013-11-15 ENCOUNTER — Inpatient Hospital Stay (HOSPITAL_COMMUNITY): Payer: Medicare Other

## 2013-11-15 ENCOUNTER — Telehealth (INDEPENDENT_AMBULATORY_CARE_PROVIDER_SITE_OTHER): Payer: Self-pay

## 2013-11-15 DIAGNOSIS — K632 Fistula of intestine: Secondary | ICD-10-CM

## 2013-11-15 LAB — MAGNESIUM: MAGNESIUM: 1.8 mg/dL (ref 1.5–2.5)

## 2013-11-15 LAB — CBC
HCT: 34.9 % — ABNORMAL LOW (ref 39.0–52.0)
HEMOGLOBIN: 11.5 g/dL — AB (ref 13.0–17.0)
MCH: 30.2 pg (ref 26.0–34.0)
MCHC: 33 g/dL (ref 30.0–36.0)
MCV: 91.6 fL (ref 78.0–100.0)
Platelets: 258 10*3/uL (ref 150–400)
RBC: 3.81 MIL/uL — ABNORMAL LOW (ref 4.22–5.81)
RDW: 14.3 % (ref 11.5–15.5)
WBC: 10.8 10*3/uL — ABNORMAL HIGH (ref 4.0–10.5)

## 2013-11-15 LAB — COMPREHENSIVE METABOLIC PANEL
ALBUMIN: 2.7 g/dL — AB (ref 3.5–5.2)
ALK PHOS: 41 U/L (ref 39–117)
ALT: 84 U/L — AB (ref 0–53)
AST: 32 U/L (ref 0–37)
BILIRUBIN TOTAL: 0.6 mg/dL (ref 0.3–1.2)
BUN: 40 mg/dL — ABNORMAL HIGH (ref 6–23)
CHLORIDE: 100 meq/L (ref 96–112)
CO2: 24 mEq/L (ref 19–32)
Calcium: 9.4 mg/dL (ref 8.4–10.5)
Creatinine, Ser: 1.56 mg/dL — ABNORMAL HIGH (ref 0.50–1.35)
GFR calc non Af Amer: 47 mL/min — ABNORMAL LOW (ref >90)
GFR, EST AFRICAN AMERICAN: 54 mL/min — AB (ref >90)
GLUCOSE: 189 mg/dL — AB (ref 70–99)
POTASSIUM: 4.8 meq/L (ref 3.7–5.3)
SODIUM: 134 meq/L — AB (ref 137–147)
TOTAL PROTEIN: 7.8 g/dL (ref 6.0–8.3)

## 2013-11-15 LAB — GLUCOSE, CAPILLARY
GLUCOSE-CAPILLARY: 175 mg/dL — AB (ref 70–99)
GLUCOSE-CAPILLARY: 193 mg/dL — AB (ref 70–99)
GLUCOSE-CAPILLARY: 228 mg/dL — AB (ref 70–99)
GLUCOSE-CAPILLARY: 232 mg/dL — AB (ref 70–99)
GLUCOSE-CAPILLARY: 267 mg/dL — AB (ref 70–99)
Glucose-Capillary: 209 mg/dL — ABNORMAL HIGH (ref 70–99)

## 2013-11-15 LAB — DIFFERENTIAL
BASOS PCT: 0 % (ref 0–1)
Basophils Absolute: 0 10*3/uL (ref 0.0–0.1)
EOS ABS: 0.3 10*3/uL (ref 0.0–0.7)
Eosinophils Relative: 3 % (ref 0–5)
Lymphocytes Relative: 24 % (ref 12–46)
Lymphs Abs: 2.6 10*3/uL (ref 0.7–4.0)
Monocytes Absolute: 1 10*3/uL (ref 0.1–1.0)
Monocytes Relative: 10 % (ref 3–12)
Neutro Abs: 6.8 10*3/uL (ref 1.7–7.7)
Neutrophils Relative %: 63 % (ref 43–77)

## 2013-11-15 LAB — PREALBUMIN: Prealbumin: 21.9 mg/dL (ref 17.0–34.0)

## 2013-11-15 LAB — PHOSPHORUS: Phosphorus: 3.1 mg/dL (ref 2.3–4.6)

## 2013-11-15 LAB — TRIGLYCERIDES: Triglycerides: 82 mg/dL (ref <150)

## 2013-11-15 MED ORDER — GLUCERNA SHAKE PO LIQD
237.0000 mL | Freq: Two times a day (BID) | ORAL | Status: DC
  Administered 2013-11-15 – 2013-11-18 (×8): 237 mL via ORAL
  Filled 2013-11-15 (×8): qty 237

## 2013-11-15 MED ORDER — VANCOMYCIN HCL 10 G IV SOLR: 1500.0000 mg | Freq: Two times a day (BID) | INTRAVENOUS | Status: DC

## 2013-11-15 MED ORDER — ACETAMINOPHEN 650 MG RE SUPP: 650.0000 mg | Freq: Four times a day (QID) | RECTAL | Status: DC | PRN

## 2013-11-15 MED ORDER — M.V.I. ADULT IV INJ
INJECTION | INTRAVENOUS | Status: AC
Start: 2013-11-15 — End: 2013-11-16
  Administered 2013-11-15: 20:00:00 via INTRAVENOUS
  Filled 2013-11-15: qty 2000

## 2013-11-15 MED ORDER — OXYCODONE HCL 5 MG PO TABS
5.0000 mg | ORAL_TABLET | Freq: Four times a day (QID) | ORAL | Status: DC | PRN
  Administered 2013-11-15 – 2013-11-18 (×7): 10 mg via ORAL
  Filled 2013-11-15 (×7): qty 2

## 2013-11-15 MED ORDER — VANCOMYCIN HCL 10 G IV SOLR
2000.0000 mg | Freq: Once | INTRAVENOUS | Status: DC
  Filled 2013-11-15: qty 2000

## 2013-11-15 MED ORDER — FAT EMULSION 20 % IV EMUL
250.0000 mL | INTRAVENOUS | Status: AC
  Administered 2013-11-15: 250 mL via INTRAVENOUS
  Filled 2013-11-15: qty 250

## 2013-11-15 MED ORDER — LORAZEPAM 2 MG/ML IJ SOLN
INTRAMUSCULAR | Status: AC
Start: 2013-11-15 — End: 2013-11-15
  Filled 2013-11-15: qty 1

## 2013-11-15 MED ORDER — ACETAMINOPHEN 325 MG PO TABS: 325.0000 mg | ORAL_TABLET | Freq: Four times a day (QID) | ORAL | Status: DC | PRN

## 2013-11-15 MED ORDER — SODIUM CHLORIDE 0.9 % IJ SOLN
10.0000 mL | INTRAMUSCULAR | Status: DC | PRN
  Administered 2013-11-15 – 2013-11-17 (×4): 10 mL

## 2013-11-15 MED ORDER — LORAZEPAM 2 MG/ML IJ SOLN
0.5000 mg | Freq: Once | INTRAMUSCULAR | Status: AC
  Administered 2013-11-15: 0.5 mg via INTRAVENOUS

## 2013-11-15 NOTE — Progress Notes (Addendum)
Kurt Cross [id number] Sep 24, 1951  CARE TEAM:  PCP: [name], MD  Outpatient Care Team: Patient Care Team: [name], MD as PCP - General (Internal Medicine) [name], MD as Consulting Physician (Cardiology) [name], MD as Consulting Physician (Pulmonary Disease) [name], MD as Consulting Physician (General Surgery)  Inpatient Treatment Team: Treatment Team: Attending Provider: Sheila Oats, MD; Rounding Team: Redmond Baseman, MD; Dietitian: Christie Beckers, RD; Consulting Physician: Nolon Nations, MD; Consulting Physician: Sherril Croon, MD; Registered Nurse: Ricky Ala, RN; Registered Nurse: Patton Salles, RN; Physician Assistant: Clinton Gallant, NT; Consulting Physician: [name], MD   Subjective:  Out of ICU UOP improved Saw GMS/outpt WOCN RN but "she didn't have the right pouch for me"  Objective:  Vital signs:  Filed Vitals:   11/14/13 1230 11/14/13 2203 11/15/13 0205 11/15/13 0410  BP: 135/70 140/64  125/65  Pulse: 61 59 63 61  Temp: 98.1 F (36.7 C) 98.2 F (36.8 C)  97.8 F (36.6 C)  TempSrc: Oral Oral  Oral  Resp: $Remo'18 20 20 20  'Kbhdp$ Height:      Weight:      SpO2: 100% 97% 99% 99%    Last BM Date:  (colostomy)  Intake/Output   Yesterday:  02/08 0701 - 02/09 0700 In: 2273.7 [I.V.:798.7; IV Piggyback:675; TPN:360] Out: 2300 [Urine:1450; Drains:500; Stool:350] This shift:     Bowel function:  Flatus: y  BM: y - 500 thick brown effluent caught in [employer] bag    Physical Exam:  General: Pt awake/alert/oriented x4 in no acute distress Eyes: PERRL, normal EOM.  Sclera clear.  No icterus Neuro: CN II-XII intact w/o focal sensory/motor deficits. Lymph: No head/neck/groin lymphadenopathy Psych:  No delerium/psychosis/paranoia HENT: Normocephalic, Mucus membranes moist.  No thrush.  Right IJ central line in place Neck: Supple, No tracheal deviation Chest: No chest wall pain w good excursion CV:  Pulses intact.   Regular rhythm MS: Normal AROM mjr joints.  No obvious deformity Abdomen: Soft.  Obese.  Nondistended.  Nontender.  No evidence of peritonitis.  No incarcerated hernias.  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  Less undermining.    Ext:  SCDs BLE.  No mjr edema.  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 male       Improving  Plan:  -Large Eakin's pouch to control EC fistula into contracting wound.  I am doubtful that the suction is working well & lets him stay bedridden - drain ostomy pouch q6h & PRN.  -WOCN/ostomy care as outpatient - HH did not go well & they did not seem too compliant/successful w outpt WOCN w [employer].  See what ideas inpt WOCN have.  -NO WOUND INFECTION.  Pt does not need any antibiotics for this chronic abd wound.  I d/c'd since it seems that IM only had it on board for wound concerns  -adv diet & follow.  Cal counts.  Adjust TNA as needed - see if can get off  all together.  This is the main reason pt readmitted - not the complicated EC fistula wound.  Outpt TNA per medicine.  I reviewed CT scan with medicine.  Fistula more likely distal jejunum/ prox ileum.  Therefore may be able to eventually get off TNA.  Pt notes he was eating solid food OK  -Consider new PICC - IJ central line not good long term option & may be able to wean off TNA, so I would not rush to [place] just yet.  -DM control - ?inc Humulin? - defer to primary med service.  -VTE prophylaxis- SCDs, etc  -mobilize as  tolerated to help recovery.  PT/OT evals - ?SNF since bounced back from home  -Evaluate with hernia guru at [facility]. Appt for 11February at 10 AM (most likely delay 1-2 weeks until out of hospital:   Dr Bettey Mare  [facility].  [address], [address]  [place], [place] [address] [phone]   Ardeth Sportsman, M.D., F.A.C.S. Gastrointestinal and Minimally Invasive Surgery [facility] [address], [address] [place], [place] [address] [phone] Main / Paging   11/15/2013   Results:   Labs: Results for orders placed during the hospital encounter of 11/11/13 (from the past 48 hour(s))  BASIC METABOLIC PANEL     Status: Abnormal   Collection Time    11/13/13 10:10 AM      Result Value Range   Sodium 130 (*) 137 - 147 mEq/L   Potassium 5.5 (*) 3.7 - 5.3 mEq/L   Chloride 94 (*) 96 - 112 mEq/L   CO2 22  19 - 32 mEq/L   Glucose, Bld 242 (*) 70 - 99 mg/dL   BUN 89 (*) 6 - 23 mg/dL   Creatinine, Ser 9.72 (*) 0.50 - 1.35 mg/dL   Calcium 9.4  8.4 - 81.8 mg/dL   GFR calc non Af Amer 29 (*) >90 mL/min   GFR calc Af Amer 33 (*) >90 mL/min   Comment: (NOTE)     The eGFR has been calculated using the CKD EPI equation.     This calculation has not been validated in all clinical situations.     eGFR's persistently <90 mL/min signify possible Chronic Kidney     Disease.  GLUCOSE, CAPILLARY     Status: Abnormal   Collection Time    11/13/13 11:54 AM      Result Value Range   Glucose-Capillary 225 (*) 70 - 99 mg/dL  BASIC METABOLIC PANEL     Status: Abnormal   Collection Time    11/13/13  2:25 PM      Result Value Range   Sodium 136 (*) 137 - 147 mEq/L   Potassium 5.1  3.7 - 5.3 mEq/L   Chloride 104  96 - 112 mEq/L   Comment: RESULT REPEATED AND VERIFIED     DELTA CHECK NOTED   CO2 20  19 - 32 mEq/L   Glucose, Bld 245 (*) 70 - 99 mg/dL   BUN 71 (*) 6 - 23 mg/dL   Creatinine, Ser 7.97 (*) 0.50 - 1.35  mg/dL   Calcium 8.1 (*) 8.4 - 10.5 mg/dL   GFR calc non Af Amer 37 (*) >90 mL/min   GFR calc Af Amer 43 (*) >90 mL/min   Comment: (NOTE)     The eGFR has been calculated using the CKD EPI equation.     This calculation has not been validated in all clinical situations.  eGFR's persistently <90 mL/min signify possible Chronic Kidney     Disease.  GLUCOSE, CAPILLARY     Status: Abnormal   Collection Time    11/13/13  4:09 PM      Result Value Range   Glucose-Capillary 292 (*) 70 - 99 mg/dL  BASIC METABOLIC PANEL     Status: Abnormal   Collection Time    11/13/13  6:20 PM      Result Value Range   Sodium 135 (*) 137 - 147 mEq/L   Potassium 5.6 (*) 3.7 - 5.3 mEq/L   Chloride 99  96 - 112 mEq/L   CO2 23  19 - 32 mEq/L   Glucose, Bld 293 (*) 70 - 99 mg/dL   BUN 74 (*) 6 - 23 mg/dL   Creatinine, Ser 2.13 (*) 0.50 - 1.35 mg/dL   Calcium 9.6  8.4 - 10.5 mg/dL   GFR calc non Af Amer 32 (*) >90 mL/min   GFR calc Af Amer 37 (*) >90 mL/min   Comment: (NOTE)     The eGFR has been calculated using the CKD EPI equation.     This calculation has not been validated in all clinical situations.     eGFR's persistently <90 mL/min signify possible Chronic Kidney     Disease.  GLUCOSE, CAPILLARY     Status: Abnormal   Collection Time    11/13/13  8:58 PM      Result Value Range   Glucose-Capillary 312 (*) 70 - 99 mg/dL  BASIC METABOLIC PANEL     Status: Abnormal   Collection Time    11/13/13 10:30 PM      Result Value Range   Sodium 133 (*) 137 - 147 mEq/L   Potassium 5.4 (*) 3.7 - 5.3 mEq/L   Chloride 98  96 - 112 mEq/L   CO2 24  19 - 32 mEq/L   Glucose, Bld 281 (*) 70 - 99 mg/dL   BUN 68 (*) 6 - 23 mg/dL   Creatinine, Ser 2.03 (*) 0.50 - 1.35 mg/dL   Calcium 9.3  8.4 - 10.5 mg/dL   GFR calc non Af Amer 34 (*) >90 mL/min   GFR calc Af Amer 39 (*) >90 mL/min   Comment: (NOTE)     The eGFR has been calculated using the CKD EPI equation.     This calculation has not been validated in  all clinical situations.     eGFR's persistently <90 mL/min signify possible Chronic Kidney     Disease.  GLUCOSE, CAPILLARY     Status: Abnormal   Collection Time    11/13/13 11:51 PM      Result Value Range   Glucose-Capillary 253 (*) 70 - 99 mg/dL   Comment 1 Notify RN    BASIC METABOLIC PANEL     Status: Abnormal   Collection Time    11/14/13  2:05 AM      Result Value Range   Sodium 132 (*) 137 - 147 mEq/L   Potassium 5.1  3.7 - 5.3 mEq/L   Chloride 98  96 - 112 mEq/L   CO2 23  19 - 32 mEq/L   Glucose, Bld 239 (*) 70 - 99 mg/dL   BUN 66 (*) 6 - 23 mg/dL   Creatinine, Ser 1.91 (*) 0.50 - 1.35 mg/dL   Calcium 9.0  8.4 - 10.5 mg/dL   GFR calc non Af Amer 37 (*) >90 mL/min   GFR calc Af Amer 42 (*) >90  mL/min   Comment: (NOTE)     The eGFR has been calculated using the CKD EPI equation.     This calculation has not been validated in all clinical situations.     eGFR's persistently <90 mL/min signify possible Chronic Kidney     Disease.  PROCALCITONIN     Status: None   Collection Time    11/14/13  5:00 AM      Result Value Range   Procalcitonin 0.22     Comment:            Interpretation:     PCT (Procalcitonin) <= 0.5 ng/mL:     Systemic infection (sepsis) is not likely.     Local bacterial infection is possible.     (NOTE)             ICU PCT Algorithm               Non ICU PCT Algorithm        ----------------------------     ------------------------------             PCT < 0.25 ng/mL                 PCT < 0.1 ng/mL         Stopping of antibiotics            Stopping of antibiotics           strongly encouraged.               strongly encouraged.        ----------------------------     ------------------------------           PCT level decrease by               PCT < 0.25 ng/mL           >= 80% from peak PCT           OR PCT 0.25 - 0.5 ng/mL          Stopping of antibiotics                                                 encouraged.         Stopping of antibiotics                encouraged.        ----------------------------     ------------------------------           PCT level decrease by              PCT >= 0.25 ng/mL           < 80% from peak PCT            AND PCT >= 0.5 ng/mL            Continuing antibiotics                                                  encouraged.           Continuing antibiotics                encouraged.        ----------------------------     ------------------------------  PCT level increase compared          PCT > 0.5 ng/mL             with peak PCT AND              PCT >= 0.5 ng/mL             Escalation of antibiotics                                              strongly encouraged.          Escalation of antibiotics            strongly encouraged.  COMPREHENSIVE METABOLIC PANEL     Status: Abnormal   Collection Time    11/14/13  5:00 AM      Result Value Range   Sodium 136 (*) 137 - 147 mEq/L   Potassium 5.3  3.7 - 5.3 mEq/L   Chloride 102  96 - 112 mEq/L   CO2 25  19 - 32 mEq/L   Glucose, Bld 232 (*) 70 - 99 mg/dL   BUN 62 (*) 6 - 23 mg/dL   Creatinine, Ser 1.91 (*) 0.50 - 1.35 mg/dL   Calcium 9.3  8.4 - 10.5 mg/dL   Total Protein 7.3  6.0 - 8.3 g/dL   Albumin 2.7 (*) 3.5 - 5.2 g/dL   AST 53 (*) 0 - 37 U/L   ALT 111 (*) 0 - 53 U/L   Alkaline Phosphatase 42  39 - 117 U/L   Total Bilirubin 0.6  0.3 - 1.2 mg/dL   GFR calc non Af Amer 37 (*) >90 mL/min   GFR calc Af Amer 42 (*) >90 mL/min   Comment: (NOTE)     The eGFR has been calculated using the CKD EPI equation.     This calculation has not been validated in all clinical situations.     eGFR's persistently <90 mL/min signify possible Chronic Kidney     Disease.  PREALBUMIN     Status: None   Collection Time    11/14/13  5:00 AM      Result Value Range   Prealbumin 21.3  17.0 - 34.0 mg/dL   Comment: Performed at [place] [facility]     Status: None   Collection Time    11/14/13  5:00 AM      Result Value Range   Magnesium  2.0  1.5 - 2.5 mg/dL  PHOSPHORUS     Status: None   Collection Time    11/14/13  5:00 AM      Result Value Range   Phosphorus 3.9  2.3 - 4.6 mg/dL  TRIGLYCERIDES     Status: None   Collection Time    11/14/13  5:00 AM      Result Value Range   Triglycerides 79  <150 mg/dL   Comment: Performed at [facility]  CBC     Status: Abnormal   Collection Time    11/14/13  5:00 AM      Result Value Range   WBC 10.1  4.0 - 10.5 K/uL   RBC 3.73 (*) 4.22 - 5.81 MIL/uL   Hemoglobin 11.2 (*) 13.0 - 17.0 g/dL   HCT 34.0 (*) 39.0 - 52.0 %   MCV 91.2  78.0 - 100.0 fL  MCH 30.0  26.0 - 34.0 pg   MCHC 32.9  30.0 - 36.0 g/dL   RDW 14.5  11.5 - 15.5 %   Platelets 238  150 - 400 K/uL  DIFFERENTIAL     Status: Abnormal   Collection Time    11/14/13  5:00 AM      Result Value Range   Neutrophils Relative % 64  43 - 77 %   Neutro Abs 6.5  1.7 - 7.7 K/uL   Lymphocytes Relative 22  12 - 46 %   Lymphs Abs 2.2  0.7 - 4.0 K/uL   Monocytes Relative 11  3 - 12 %   Monocytes Absolute 1.1 (*) 0.1 - 1.0 K/uL   Eosinophils Relative 3  0 - 5 %   Eosinophils Absolute 0.3  0.0 - 0.7 K/uL   Basophils Relative 0  0 - 1 %   Basophils Absolute 0.0  0.0 - 0.1 K/uL  GLUCOSE, CAPILLARY     Status: Abnormal   Collection Time    11/14/13  5:14 AM      Result Value Range   Glucose-Capillary 205 (*) 70 - 99 mg/dL  GLUCOSE, CAPILLARY     Status: Abnormal   Collection Time    11/14/13  8:15 AM      Result Value Range   Glucose-Capillary 213 (*) 70 - 99 mg/dL  GLUCOSE, CAPILLARY     Status: Abnormal   Collection Time    11/14/13  1:21 PM      Result Value Range   Glucose-Capillary 243 (*) 70 - 99 mg/dL  GLUCOSE, CAPILLARY     Status: Abnormal   Collection Time    11/14/13  5:22 PM      Result Value Range   Glucose-Capillary 242 (*) 70 - 99 mg/dL  GLUCOSE, CAPILLARY     Status: Abnormal   Collection Time    11/14/13  9:50 PM      Result Value Range   Glucose-Capillary 223 (*) 70 - 99 mg/dL    Comment 1 Documented in Chart     Comment 2 Notify RN    GLUCOSE, CAPILLARY     Status: Abnormal   Collection Time    11/15/13 12:25 AM      Result Value Range   Glucose-Capillary 232 (*) 70 - 99 mg/dL   Comment 1 Documented in Chart     Comment 2 Notify RN    GLUCOSE, CAPILLARY     Status: Abnormal   Collection Time    11/15/13  4:08 AM      Result Value Range   Glucose-Capillary 193 (*) 70 - 99 mg/dL   Comment 1 Documented in Chart     Comment 2 Notify RN    COMPREHENSIVE METABOLIC PANEL     Status: Abnormal   Collection Time    11/15/13  4:25 AM      Result Value Range   Sodium 134 (*) 137 - 147 mEq/L   Potassium 4.8  3.7 - 5.3 mEq/L   Chloride 100  96 - 112 mEq/L   CO2 24  19 - 32 mEq/L   Glucose, Bld 189 (*) 70 - 99 mg/dL   BUN 40 (*) 6 - 23 mg/dL   Comment: REPEATED TO VERIFY     DELTA CHECK NOTED   Creatinine, Ser 1.56 (*) 0.50 - 1.35 mg/dL   Calcium 9.4  8.4 - 10.5 mg/dL   Total Protein 7.8  6.0 - 8.3 g/dL  Albumin 2.7 (*) 3.5 - 5.2 g/dL   AST 32  0 - 37 U/L   ALT 84 (*) 0 - 53 U/L   Alkaline Phosphatase 41  39 - 117 U/L   Total Bilirubin 0.6  0.3 - 1.2 mg/dL   GFR calc non Af Amer 47 (*) >90 mL/min   GFR calc Af Amer 54 (*) >90 mL/min   Comment: (NOTE)     The eGFR has been calculated using the CKD EPI equation.     This calculation has not been validated in all clinical situations.     eGFR's persistently <90 mL/min signify possible Chronic Kidney     Disease.  MAGNESIUM     Status: None   Collection Time    11/15/13  4:25 AM      Result Value Range   Magnesium 1.8  1.5 - 2.5 mg/dL  PHOSPHORUS     Status: None   Collection Time    11/15/13  4:25 AM      Result Value Range   Phosphorus 3.1  2.3 - 4.6 mg/dL  CBC     Status: Abnormal   Collection Time    11/15/13  4:25 AM      Result Value Range   WBC 10.8 (*) 4.0 - 10.5 K/uL   RBC 3.81 (*) 4.22 - 5.81 MIL/uL   Hemoglobin 11.5 (*) 13.0 - 17.0 g/dL   HCT 34.9 (*) 39.0 - 52.0 %   MCV 91.6  78.0 -  100.0 fL   MCH 30.2  26.0 - 34.0 pg   MCHC 33.0  30.0 - 36.0 g/dL   RDW 14.3  11.5 - 15.5 %   Platelets 258  150 - 400 K/uL  DIFFERENTIAL     Status: None   Collection Time    11/15/13  4:25 AM      Result Value Range   Neutrophils Relative % 63  43 - 77 %   Neutro Abs 6.8  1.7 - 7.7 K/uL   Lymphocytes Relative 24  12 - 46 %   Lymphs Abs 2.6  0.7 - 4.0 K/uL   Monocytes Relative 10  3 - 12 %   Monocytes Absolute 1.0  0.1 - 1.0 K/uL   Eosinophils Relative 3  0 - 5 %   Eosinophils Absolute 0.3  0.0 - 0.7 K/uL   Basophils Relative 0  0 - 1 %   Basophils Absolute 0.0  0.0 - 0.1 K/uL  TRIGLYCERIDES     Status: None   Collection Time    11/15/13  4:25 AM      Result Value Range   Triglycerides 82  <150 mg/dL   Comment: Performed at [place], [facility]     Status: Abnormal   Collection Time    11/15/13  7:26 AM      Result Value Range   Glucose-Capillary 175 (*) 70 - 99 mg/dL    Imaging / Studies: No results found.  Medications / Allergies: per chart  Antibiotics: Anti-infectives   Start     Dose/Rate Route Frequency Ordered Stop   11/15/13 2200  vancomycin (VANCOCIN) 1,500 mg in sodium chloride 0.9 % 500 mL IVPB     1,500 mg 250 mL/hr over 120 Minutes Intravenous Every 12 hours 11/15/13 0737     11/15/13 1000  vancomycin (VANCOCIN) 2,000 mg in sodium chloride 0.9 % 500 mL IVPB     2,000 mg 250 mL/hr over 120 Minutes Intravenous  Once  11/15/13 0737     11/13/13 0800  vancomycin (VANCOCIN) 2,000 mg in sodium chloride 0.9 % 500 mL IVPB  Status:  Discontinued     2,000 mg 250 mL/hr over 120 Minutes Intravenous Every 24 hours 11/12/13 0554 11/15/13 0737   11/12/13 1400  piperacillin-tazobactam (ZOSYN) IVPB 3.375 g     3.375 g 12.5 mL/hr over 240 Minutes Intravenous 3 times per day 11/12/13 0553     11/12/13 0600  vancomycin (VANCOCIN) 2,250 mg in sodium chloride 0.9 % 500 mL IVPB     2,250 mg 250 mL/hr over 120 Minutes Intravenous  Once 11/12/13 0553  11/12/13 0800   11/12/13 0115  vancomycin (VANCOCIN) IVPB 1000 mg/200 mL premix  Status:  Discontinued     1,000 mg 200 mL/hr over 60 Minutes Intravenous  Once 11/12/13 0107 11/12/13 0553   11/12/13 0115  piperacillin-tazobactam (ZOSYN) IVPB 3.375 g     3.375 g 100 mL/hr over 30 Minutes Intravenous STAT 11/12/13 0107 11/12/13 0510       Note: This dictation was prepared with Dragon/digital dictation along with Smartphrase technology. Any transcriptional errors that result from this process are unintentional.

## 2013-11-15 NOTE — Progress Notes (Signed)
Peripherally Inserted Central Catheter/Midline Placement  The IV Nurse has discussed with the patient and/or persons authorized to consent for the patient, the purpose of this procedure and the potential benefits and risks involved with this procedure.  The benefits include less needle sticks, lab draws from the catheter and patient may be discharged home with the catheter.  Risks include, but not limited to, infection, bleeding, blood clot (thrombus formation), and puncture of an artery; nerve damage and irregular heat beat.  Alternatives to this procedure were also discussed.  PICC/Midline Placement Documentation  PICC / Midline Double Lumen 11/15/13 PICC Right Brachial 50 cm 2 cm (Active)  Indication for Insertion or Continuance of Line Administration of hyperosmolar/irritating solutions (i.e. TPN, Vancomycin, etc.);Home intravenous therapies (PICC only) 11/15/2013  6:38 PM  Exposed Catheter (cm) 2 cm 11/15/2013  6:38 PM  Dressing Change Due 11/22/13 11/15/2013  6:38 PM       Poff, Mordecai Rasmussen 11/15/2013, 6:44 PM

## 2013-11-15 NOTE — Progress Notes (Signed)
ANTIBIOTIC CONSULT NOTE - FOLLOW UP  Pharmacy Consult for Vancomycin and Zosyn Indication: Sepsis, PICC infection? Abdominal abscess?  No Known Allergies  Patient Measurements: Height: 5\' 11"  (180.3 cm) Weight: 368 lb 2.7 oz (167 kg) IBW/kg (Calculated) : 75.3  Vital Signs: Temp: 97.8 F (36.6 C) (02/09 0410) Temp src: Oral (02/09 0410) BP: 125/65 mmHg (02/09 0410) Pulse Rate: 61 (02/09 0410) Intake/Output from previous day: 02/08 0701 - 02/09 0700 In: 2273.7 [I.V.:798.7; IV Piggyback:675; TPN:360] Out: 2300 [Urine:1450; Drains:500; Stool:350] Intake/Output from this shift:    Labs:  Recent Labs  11/13/13 0620  11/14/13 0205 11/14/13 0500 11/15/13 0425  WBC 12.3*  --   --  10.1 10.8*  HGB 11.7*  --   --  11.2* 11.5*  PLT 259  --   --  238 258  CREATININE 2.54*  < > 1.91* 1.91* 1.56*  < > = values in this interval not displayed. Estimated Creatinine Clearance: 79.8 ml/min (by C-G formula based on Cr of 1.56). No results found for this basename: VANCOTROUGH, VANCOPEAK, VANCORANDOM, [place], GENTPEAK, GENTRANDOM, TOBRATROUGH, TOBRAPEAK, TOBRARND, AMIKACINPEAK, AMIKACINTROU, AMIKACIN,  in the last 72 hours   Microbiology: Recent Results (from the past 720 hour(s))  CULTURE, BLOOD (ROUTINE X 2)     Status: None   Collection Time    11/11/13 11:37 PM      Result Value Range Status   Specimen Description BLOOD RIGHT BICEP   Final   Special Requests BOTTLES DRAWN AEROBIC AND ANAEROBIC 5CC   Final   Culture  Setup Time     Final   Value: 11/12/2013 03:29     Performed at [facility]   Culture     Final   Value:        BLOOD CULTURE RECEIVED NO GROWTH TO DATE CULTURE WILL BE HELD FOR 5 DAYS BEFORE ISSUING A FINAL NEGATIVE REPORT     Performed at [facility]   Report Status PENDING   Incomplete  CULTURE, BLOOD (ROUTINE X 2)     Status: None   Collection Time    11/11/13 11:37 PM      Result Value Range Status   Specimen Description BLOOD LEFT HAND    Final   Special Requests BOTTLES DRAWN AEROBIC AND ANAEROBIC 4CC   Final   Culture  Setup Time     Final   Value: 11/12/2013 03:28     Performed at [facility]   Culture     Final   Value:        BLOOD CULTURE RECEIVED NO GROWTH TO DATE CULTURE WILL BE HELD FOR 5 DAYS BEFORE ISSUING A FINAL NEGATIVE REPORT     Performed at [facility]   Report Status PENDING   Incomplete  CATH TIP CULTURE     Status: None   Collection Time    11/12/13  2:53 AM      Result Value Range Status   Specimen Description CATH TIP   Final   Special Requests NONE   Final   Culture     Final   Value: NO GROWTH 2 DAYS     Performed at [facility]   Report Status 11/14/2013 FINAL   Final  MRSA PCR SCREENING     Status: None   Collection Time    11/12/13  5:34 AM      Result Value Range Status   MRSA by PCR NEGATIVE  NEGATIVE Final   Comment:  The GeneXpert MRSA Assay (FDA     approved for NASAL specimens     only), is one component of a     comprehensive MRSA colonization     surveillance program. It is not     intended to diagnose MRSA     infection nor to guide or     monitor treatment for     MRSA infections.  URINE CULTURE     Status: None   Collection Time    11/13/13  6:30 AM      Result Value Range Status   Specimen Description URINE, CATHETERIZED   Final   Special Requests NONE   Final   Culture  Setup Time     Final   Value: 11/13/2013 14:03     Performed at [place]     Final   Value: NO GROWTH     Performed at [facility]   Culture     Final   Value: NO GROWTH     Performed at [facility]   Report Status 11/14/2013 FINAL   Final   2/6 MRSA PCR: negative 2/6 blood x2: NGTD 2/6 Cath tip culture: NGF   Anti-infectives: 11/12/2013 >> vanc >> 11/12/2013 >> zosyn >>  Tmax: afeb WBCs: improved to wnl Renal: improving daily, CrCl ~ 80 (~ 50 N) PCT: 0.73 (2/5), 0.44 (2/7), 0.22  (2/8)   Assessment: D4 vanc 2250mg  x1, then 2gm q24hr, Zosyn 3.375g IV q8h extended infusions for sepsis. R/o PICC line infection. Surgery does not think abdominal infection (CT shows No intra-abdominal source of sepsis, No hydronephrosis.) Vanc dose initially borderline for q24h vs q48h, but SCr improving so will increase to 1500mg  q12h. Cxt negative so far.   Goal of Therapy:  Vancomycin trough level 15-20 mcg/ml Zosyn dose per renal function  Plan:   Increase Vancomycin to 1500mg  IV q12h  Continue Zosyn 3.375gm IV q8h (4hr extended infusions)  F/u [facility] plans for continuing abx  Peggyann Juba, PharmD, BCPS Pager: [phone] 11/15/2013,7:29 AM

## 2013-11-15 NOTE — Progress Notes (Signed)
[facility] PROGRESS NOTE  Kurt Cross [id number] DOB: August 20, 1951 DOA: 11/11/2013 PCP: [name], MD  Asessment/Plan: sepsis syndrome/hypotension -S/P PICC line removal in ED-catheter tip cultures so far with no growth -Hypotension resolved s/p IVF, more likely secondary to hypovolemia -blood and catheter tip cultures neg, and per surgery wound not infected and no further antibiotics needed -Appreciate surgery assistance with abdominal wound care. ARF with Hyperkalemia and metab acidosis -multifactorial- was on lasix, spironolactone, kcl supplements, ACEI and hypotensive on admit -above listed med dc'ed at this time -Creatinine continues to trend down with hydration, acidosis resolved>> renal signed off 2/7. -Hyperkalemia resolved Active Problems:  Diabetes mellitus, insulin dependent (IDDM), uncontrolled  -continue SSI for now Complicated abd wall abscess /Infected prosthetic mesh s/p I&D and removal 08/21/2013  -Surgery to follow and advise on if patient needs continued antibiotics for wound -Appreciate surgery  -Continuing TNA, please PICC line chronic systolic CHF, status post AICD -Continue monitor fluid status closely off diuretics d/t ARF, compensated at this time -Continue Coreg -follow and resume Lasix once creatinine normalized OSA  Afib/Flutter -rate controlled, continue coreg    Code Status: full Family Communication: none at bedside Disposition Plan: Transfer to telemetry   Consultants:  CCM s/o 2/7  Renal s/o 2/7  CCS  Procedures:  none  Antibiotics:  Vanc started on 2/5  Zosyn started on 2/5  HPI/Subjective: Sitting up in chair, states he feels better today. Denies shortness of breath. Denies nausea and no vomiting  Objective: Filed Vitals:   11/15/13 1407  BP: 111/60  Pulse: 60  Temp: 98.4 F (36.9 C)  Resp: 20    Intake/Output Summary (Last 24 hours) at 11/15/13 1828 Last data filed at 11/15/13 1802  Gross per 24 hour   Intake   2973 ml  Output   1300 ml  Net   1673 ml   Filed Weights   11/12/13 0500 11/12/13 0659  Weight: 188 kg (414 lb 7.4 oz) 167 kg (368 lb 2.7 oz)    Exam:  General: easily aroused & oriented x 3 In NAD Cardiovascular: RRR, nl S1 s2 Respiratory: CTAB             Abdomen: soft +BS mild tenderness/ND, abd wound with Eiken's pouch, appears clean  Extremities: No cyanosis and no edema    Data Reviewed: Basic Metabolic Panel:  Recent Labs Lab 11/12/13 1725  11/13/13 0620  11/13/13 1820 11/13/13 2230 11/14/13 0205 11/14/13 0500 11/15/13 0425  NA 130*  129*  < > 131*  < > 135* 133* 132* 136* 134*  K 6.5*  6.4*  < > 6.0*  < > 5.6* 5.4* 5.1 5.3 4.8  CL 99  98  < > 98  < > 99 98 98 102 100  CO2 17*  17*  < > 22  < > 23 24 23 25 24   GLUCOSE 220*  221*  < > 203*  < > 293* 281* 239* 232* 189*  BUN 113*  113*  < > 92*  < > 74* 68* 66* 62* 40*  CREATININE 2.78*  2.77*  < > 2.54*  < > 2.13* 2.03* 1.91* 1.91* 1.56*  CALCIUM 9.9  9.8  < > 9.9  < > 9.6 9.3 9.0 9.3 9.4  MG  --   --  2.2  --   --   --   --  2.0 1.8  PHOS 5.6*  --  5.1*  --   --   --   --  3.9 3.1  < > = values in this interval not displayed. Liver Function Tests:  Recent Labs Lab 11/11/13 2334 11/12/13 1725 11/14/13 0500 11/15/13 0425  AST 22  --  53* 32  ALT 57*  --  111* 84*  ALKPHOS 54  --  42 41  BILITOT 0.6  --  0.6 0.6  PROT 8.9*  --  7.3 7.8  ALBUMIN 3.2* 2.9* 2.7* 2.7*   No results found for this basename: LIPASE, AMYLASE,  in the last 168 hours No results found for this basename: AMMONIA,  in the last 168 hours CBC:  Recent Labs Lab 11/11/13 2334 11/11/13 2346 11/12/13 0149 11/13/13 0620 11/14/13 0500 11/15/13 0425  WBC 16.1*  --   --  12.3* 10.1 10.8*  NEUTROABS 11.8*  --   --   --  6.5 6.8  HGB 13.4 15.3 13.3 11.7* 11.2* 11.5*  HCT 39.5 45.0 39.0 35.0* 34.0* 34.9*  MCV 90.2  --   --  90.0 91.2 91.6  PLT 277  --   --  259 238 258   Cardiac Enzymes: No results found for  this basename: CKTOTAL, CKMB, CKMBINDEX, TROPONINI,  in the last 168 hours BNP (last 3 results)  Recent Labs  08/01/13 1649  PROBNP 481.4*   CBG:  Recent Labs Lab 11/15/13 0025 11/15/13 0408 11/15/13 0726 11/15/13 1203 11/15/13 1709  GLUCAP 232* 193* 175* 209* 228*    Recent Results (from the past 240 hour(s))  CULTURE, BLOOD (ROUTINE X 2)     Status: None   Collection Time    11/11/13 11:37 PM      Result Value Range Status   Specimen Description BLOOD RIGHT BICEP   Final   Special Requests BOTTLES DRAWN AEROBIC AND ANAEROBIC 5CC   Final   Culture  Setup Time     Final   Value: 11/12/2013 03:29     Performed at [facility]   Culture     Final   Value:        BLOOD CULTURE RECEIVED NO GROWTH TO DATE CULTURE WILL BE HELD FOR 5 DAYS BEFORE ISSUING A FINAL NEGATIVE REPORT     Performed at [facility]   Report Status PENDING   Incomplete  CULTURE, BLOOD (ROUTINE X 2)     Status: None   Collection Time    11/11/13 11:37 PM      Result Value Range Status   Specimen Description BLOOD LEFT HAND   Final   Special Requests BOTTLES DRAWN AEROBIC AND ANAEROBIC 4CC   Final   Culture  Setup Time     Final   Value: 11/12/2013 03:28     Performed at [facility]   Culture     Final   Value:        BLOOD CULTURE RECEIVED NO GROWTH TO DATE CULTURE WILL BE HELD FOR 5 DAYS BEFORE ISSUING A FINAL NEGATIVE REPORT     Performed at [facility]   Report Status PENDING   Incomplete  CATH TIP CULTURE     Status: None   Collection Time    11/12/13  2:53 AM      Result Value Range Status   Specimen Description CATH TIP   Final   Special Requests NONE   Final   Culture     Final   Value: NO GROWTH 2 DAYS     Performed at [facility]   Report Status 11/14/2013 FINAL  Final  MRSA PCR SCREENING     Status: None   Collection Time    11/12/13  5:34 AM      Result Value Range Status   MRSA by PCR NEGATIVE  NEGATIVE Final   Comment:             The GeneXpert MRSA Assay (FDA     approved for NASAL specimens     only), is one component of a     comprehensive MRSA colonization     surveillance program. It is not     intended to diagnose MRSA     infection nor to guide or     monitor treatment for     MRSA infections.  URINE CULTURE     Status: None   Collection Time    11/13/13  6:30 AM      Result Value Range Status   Specimen Description URINE, CATHETERIZED   Final   Special Requests NONE   Final   Culture  Setup Time     Final   Value: 11/13/2013 14:03     Performed at [place]     Final   Value: NO GROWTH     Performed at [facility]   Culture     Final   Value: NO GROWTH     Performed at [facility]   Report Status 11/14/2013 FINAL   Final     Studies: No results found.  Scheduled Meds: . atorvastatin  40 mg Oral q1800  . carvedilol  6.25 mg Oral BID WC  . feeding supplement (GLUCERNA SHAKE)  237 mL Oral BID BM  . heparin  5,000 Units Subcutaneous Q8H  . insulin aspart  0-20 Units Subcutaneous Q4H  . OxyCODONE  20 mg Oral Q12H  . sodium chloride  10-40 mL Intracatheter Q12H  . sodium chloride  3 mL Intravenous Q12H   Continuous Infusions: . dextrose 5 % and 0.9% NaCl 40 mL/hr at 11/14/13 1100  . TPN (CLINIMIX) Adult without lytes     And  . fat emulsion      Principal Problem:   AKI (acute kidney injury) Active Problems:   Diabetes mellitus, insulin dependent (IDDM), uncontrolled   Obesity, Class III, BMI 40-49.9 (morbid obesity)   OBSTRUCTIVE SLEEP APNEA   Infected prosthetic mesh of abdominal wall with GIANT abscess s/p removal 08/14/2013   Jejunal SB fistula into abdominal wound   Atrial fibrillation/flutter   Short gut syndrome due to distal jejunal SB fistula   Hyperkalemia   Severe sepsis with acute organ dysfunction   Time spent:25    Donzel Romack C  [facility] Pager [phone]. If 7PM-7AM, please contact night-coverage at  [email or web address], password [facility] 11/15/2013, 6:28 PM  LOS: 4 days

## 2013-11-15 NOTE — Progress Notes (Signed)
Placed patient on CPAP, auto titrate settings with 2 lpm O2 bleed in.  Patient tolerating well at this time.

## 2013-11-15 NOTE — Progress Notes (Signed)
Patient known to CSW from previous admission. Previous challenges in finding snf placement still exist. There is no facility that can give both continuous suction and TNA. However, it appears that the treatment team is attempting to advance diet and wean TNA. If TNA is discontinued, CSW will be able to find a placement that can offer continuous wall suction.  Saumya Hukill C. Graves MSW, [place]

## 2013-11-15 NOTE — Telephone Encounter (Signed)
Called to r/s the appt on 11/17/13 10:00 to 12/01/13 10:00 b/c the pt is currently admitted to the hospital now. The r/s the appt for me. I will notify the pt.  LMOM for pt giving them the new appt info.

## 2013-11-15 NOTE — Care Management Note (Addendum)
    Page 1 of 2   11/18/2013     12:42:15 PM   CARE MANAGEMENT NOTE 11/18/2013  Patient:  Amesquita,Terryl A   Account Number:  [id number]  Date Initiated:  11/12/2013  Documentation initiated by:  DAVIS,RHONDA  Subjective/Objective Assessment:   patient readmitted due to hypotension,hyperkalemia, sepsis, fistula as before.     Action/Plan:   Has been at home with hhc, plan for surgical intervention of large fistula in [place] on [id number]/   Anticipated DC Date:  11/18/2013   Anticipated DC Plan:  [place]  In-house referral  NA      DC Planning Services  CM consult      [facility] Choice  Resumption Of Svcs/PTA Provider   Choice offered to / List presented to:  C-1 Patient   DME arranged  NA      DME agency  [place] arranged  HH-1 RN  [place].   Status of service:  Completed, signed off Medicare Important Message given?  NA - LOS <3 / Initial given by admissions (If response is "NO", the following Medicare IM given date fields will be blank) Date Medicare IM given:   Date Additional Medicare IM given:    Discharge Disposition:  [place]  Per UR Regulation:  Reviewed for med. necessity/level of care/duration of stay  If discussed at [place] of Stay Meetings, dates discussed:   11/16/2013  11/18/2013    Comments:  11/18/13 Cleota Pellerito RN,BSN NCM [phone] [facility] REP KRISTEN AWARE OF HHRN/PT/NURSE'S AIDE ORDER.PICC D/C,TPN D/C,ABD WALL SUCTION D/C YESTERDAY.WOUND CARE FOR [facility]OU[facility] SPECIFIC TYPE FOR HOME [id number] FOR FUTURE ORDERING OF SUPPLIES.  11/16/13 Theodoros Stjames RN,BSN NCM [phone] PATIENT HAS DECIDED TO D/C HOME [place].[facility] HAS SPOKEN TO PATIENT ABOUT HOME SUPPLY ISSUES(SEE THEIR NOTE). THN ALSO HAS SPOKEN TO PATIENT ABOUT HOME ISSUES(SEE THEIR NOTE).TNA WEANING,ABD WALL SUCTION OFF.CURRENT D/C PLAN HOME [facility].NOTED PATIENT ALREADY  HAS PRIOR ORDER FOR W/C,& [place]APE[place] BAR.[facility]-HHRN.AWAIT FINAL HHC ORDERS.  11/15/13 Cindy Brindisi RN,BSN NCM [phone] SPOKE TO ATTENDING WHO WANTS TO EXPLORE SNF PER PATIENT- IF COVERED IN  INSURANCE PLAN,WHAT THE CO PAY IS,& IN [place].SW NOTIFIED. THN ALSO FOLLOWING.TC [facility] PAM CHANDLER TO CONFIRM HOW HHC WAS PROVIDED PRIOR TO THIS ADMISSION, ALSO TO F/U ON THE [facility] FOR SNF IF STAFF IS AVAILABLE TO MANAGE TNA,& ABD WOUND CONTINUOUS SUCTION.AWAIT RESPONSE.  TRANSFER FROM SDU.R ARM PICC REMOVED.RIJ-CENTRAL CATH IN PLACE.TNA @80 ,ABD WOUND EC FISTULA-EAKIN POUCH-CONTINUOUS SUCTION-500CC [place]OWN,THICK OUTPUT.[place] FOLLOWING-WOUND CARE.SX FOLLOWING-ABD WOUND.FOR SX IN [place]-ABD WALL HERNIA/EC FISTULA. NEPHROLOGY FOLLOWING-AKI.D/C PLAN HOME W/[facility]-HHRN/PT.[place] IS AWARE OF THE HOME ISSUES OF THE HOME [place].  Jud, RN, [place], [place] [phone] Chart Reviewed for discharge and hospital needs. Discharge needs at time of review:  None present will follow for needs. Review of patient progress due on [id number].

## 2013-11-15 NOTE — Progress Notes (Addendum)
Patient was active with [facility] services for DM and HF disease management. However, patient only agreed to be followed telephonically. He declined home visits. If discharge plan is for home again, will encourage patient to allow [facility] Coordinator to visit in order to maximize services that are offered by [place] [facility]. Made inpatient RNCM aware. Will continue to follow. Marthenia Rolling, MSN, RN,BSN- [facility] [phone]

## 2013-11-15 NOTE — Progress Notes (Signed)
PARENTERAL NUTRITION CONSULT NOTE - Follow up  Pharmacy Consult for TPN Indication: Enterocutaneous fistula  No Known Allergies  Patient Measurements: Height: 5\' 11"  (180.3 cm) Weight: 368 lb 2.7 oz (167 kg) IBW/kg (Calculated) : 75.3 Adjusted Body Weight: 112kg  Vital Signs: Temp: 97.8 F (36.6 C) (02/09 0410) Temp src: Oral (02/09 0410) BP: 125/65 mmHg (02/09 0410) Pulse Rate: 61 (02/09 0410) Intake/Output from previous day: 02/08 0701 - 02/09 0700 In: 2273.7 [I.V.:798.7; IV Piggyback:675; TPN:360] Out: 2300 [Urine:1450; Drains:500; Stool:350]  Labs:  Recent Labs  11/13/13 0620 11/14/13 0500 11/15/13 0425  WBC 12.3* 10.1 10.8*  HGB 11.7* 11.2* 11.5*  HCT 35.0* 34.0* 34.9*  PLT 259 238 258     Recent Labs  11/12/13 1725  11/13/13 0620  11/14/13 0205 11/14/13 0500 11/15/13 0425  NA 130*  129*  < > 131*  < > 132* 136* 134*  K 6.5*  6.4*  < > 6.0*  < > 5.1 5.3 4.8  CL 99  98  < > 98  < > 98 102 100  CO2 17*  17*  < > 22  < > 23 25 24   GLUCOSE 220*  221*  < > 203*  < > 239* 232* 189*  BUN 113*  113*  < > 92*  < > 66* 62* 40*  CREATININE 2.78*  2.77*  < > 2.54*  < > 1.91* 1.91* 1.56*  CALCIUM 9.9  9.8  < > 9.9  < > 9.0 9.3 9.4  MG  --   --  2.2  --   --  2.0 1.8  PHOS 5.6*  --  5.1*  --   --  3.9 3.1  PROT  --   --   --   --   --  7.3 7.8  ALBUMIN 2.9*  --   --   --   --  2.7* 2.7*  AST  --   --   --   --   --  53* 32  ALT  --   --   --   --   --  111* 84*  ALKPHOS  --   --   --   --   --  42 41  BILITOT  --   --   --   --   --  0.6 0.6  PREALBUMIN  --   --   --   --   --  21.3  --   TRIG  --   --   --   --   --  79  --   < > = values in this interval not displayed. Estimated Creatinine Clearance: 79.8 ml/min (by C-G formula based on Cr of 1.56).    Recent Labs  11/14/13 2150 11/15/13 0025 11/15/13 0408  GLUCAP 223* 232* 193*    Insulin Requirements in the past 24 hours:  CBGs range 193 - 243 42 units of moderate SSI q4h. 60 units/ 2L  TNA.  PTA insulin included:  NPH 40 units BID, Novolog 12 units BID, Regular insulin in TPN 60 units/3L/24 hrs.  Nutritional Goals:  Previous goals per RD: [id number] kcal/day, 160-180 grams/day protein, 3.3-3.5 L/day fluid.  Clinimix E 5/15 at goal rate 125 ml/hr (3L/day) with 20% lipids at 10 ml/hr to provide 150g protein and 2610 kcal daily. Due to TNA bag capacity, maximum volume is 3L per 24 hrs. This rate will meet >/= 90% of estimated needs.  Current Nutrition:  Diet: CL IVF:  D5NS @ 40 ml/hr. TNA: Clinimix 5/15 @ 80 ml/hr, Lipids @ 10 ml/hr  Assessment:  63yo M known to pharmacy from recent admission. Readmitted 2/5 with hyperkalemia, generalized weakness, suspected PICC site infection. On TNA at home per [facility] d/t abdominal abscess and EC fistula. TNA and PICC were removed on admission.  Central access with right IJ was placed 2/6. Pharmacy is asked to resume TNA on 2/7.  Labs:  Glucose: Hx DM on insulin with previous TPN. CBGs consistently elevated, even prior to TPN restarting at 1800 on 2/7. Receiving SSI and insulin in TPN, will cont to titrate.  Goal 90-180.  Electrolytes: Na is low but improved. K, Phos, Mag, and others WNL - NO electrolytes in current TNA Renal: BUN/SCr elevated but improving daily LFTs: AST and ALT elevated on 2/8 but improved today.  TGs: 67 (1/5), pending today Prealbumin: 18.1(1/5), pending today    Plan:  Continue Clinimix 5/15 at 44ml/hr. NO ELECTROLYTES.  Do not advance rate due to hyperglycemia. Continue 20% fat emulsion at 86ml/hr. TNA to contain standard multivitamins and trace elements. Continue IVF at 24ml/hr - MD, consider removing dextrose? Increase regular insulin in TNA to 40 units/L. (80 units/2L bag) Continue SSI resistant scale q4h.  TNA lab panels on Mondays & Thursdays. F/u daily.  Peggyann Juba, PharmD, BCPS Pager: [phone]  11/15/2013 7:20 AM

## 2013-11-15 NOTE — Progress Notes (Signed)
Patient up to chair for approx an hour until IV team came to insert PICC.  Patient tolerated well.

## 2013-11-15 NOTE — Progress Notes (Signed)
NUTRITION FOLLOW UP/CALORIE COUNT/ILEOSTOMY DIET EDUCATION  Intervention:   -Ileostomy diet education -Encouraged small frequent meals and supplement intake -TPN per pharmacy -Diet advancement per MD and RN -See Calorie Count under Assessment. Will continue on 2/10  Nutrition Dx:   Inadequate oral intake related to inability to eat as evidenced by NPO- now r/t to PO intake < 75%   Goal:   PO and TPN to meet >/= 90% of their estimated nutrition needs    Monitor:   Weights, labs, TPN, diet advancement   Assessment:  2/06: Pt known to RD from admission last year. Discussed home TPN regimen with [facility] and inpatient pharmacist. Pt getting 3L bag of TPN that was provided 2219 calories and 150g of protein which was meeting 94% of estimated calorie needs and 94% of estimated protein needs. Met with pt and wife who report pt was typically getting 12 hour nightly infusion of TPN however switched to 24 hour continuous on Saturday PTA as pt was c/o weakness. Wife reports pt with minimal PO intake at home, typically a small bite of a boiled egg or applesauce. Pt's stomach growling during visit. Wife reports there have been only 1 or 2 days that pt was not getting TPN at home due to pt c/o soreness in arm from where IV was. Per conversation with pharmacist, no plans for TPN today. Per past weight trend, pt's weight down 47 pounds in the past month, unsure of accuracy of this weight.  Potassium elevated  BUN/Cr significantly elevated with low GFR  ALT elevated   2/09: -Pt receiving Clinimix 5/15 at 80 ml/hr with no electrolytes and 20% fat emulsion at 76m/hr.  -Current TPN regimen is providing 1795 kcal (76% est kcal needs), and 96 gram protein (60% est protein needs) -Rate has not been advanced d/t hyperglycemia per pharm note -IVF at 40 ml/hr -CBGs consistently elevated -Phos, Mag and K all WNL -MD noted pt may be able to get off home TNA as CT scan indicated fistula more likely  distal jejunum/prox ileum.  -Pt w/new ileostomy. Provided pt with "Colostomy, Ileostomy, and Rectal Pouch" booklet regarding nutrition therapy for new ileostomy. Encouraged pt to consume small frequent meals, adequate fluid intake, and low fiber diet with gradual incorporation of fiber foods. Recommended to limit foods that would contribute to gas and/or stoma blockage. Pt verbalized understanding and expected food compliance as pt was in agreement to perform all aspect of nutrition therapy -Pt was advanced to clear liquid diet on 2/06, then [place]. Glucerna shakes ordered on 2/09 -Diet to be advanced to Dys 3 per RN if pt having flatus and tolerating Dys1  Calorie Count Note  48 hour calorie count ordered.  Diet: Dysphagia 1 Supplements: Glucerna shakes BID inbetween meals  Breakfast: 465 kcal, 19 gram protein Lunch: Pt declined lunch Dinner: Not available Supplements:220 kcal, 10 gram protein  Total intake: 685 kcal (29% of minimum estimated needs)  29 gram protein (18% of minimum estimated needs)  Will continue Calorie Count on 2/10.   Height: Ht Readings from Last 1 Encounters:  11/12/13 _0  (1.803 m)    Weight Status:   Wt Readings from Last 1 Encounters:  11/12/13 368 lb 2.7 oz (167 kg)    Re-estimated needs:  Kcal: 2370-2650  Protein: 160-180g  Fluid: >2.6L/day    Skin:abdominal wound  Diet Order: Dysphagia 1   Intake/Output Summary (Last 24 hours) at 11/15/13 0853 Last data filed at 11/15/13 0700  Gross  per 24 hour  Intake 2151.17 ml  Output   1700 ml  Net 451.17 ml    Last BM: 2/08   Labs:   Recent Labs Lab 11/13/13 0620  11/14/13 0205 11/14/13 0500 11/15/13 0425  NA 131*  < > 132* 136* 134*  K 6.0*  < > 5.1 5.3 4.8  CL 98  < > 98 102 100  CO2 22  < > _0 BUN 92*  < > 66* 62* 40*  CREATININE 2.54*  < > 1.91* 1.91* 1.56*  CALCIUM 9.9  < > 9.0 9.3 9.4  MG 2.2  --   --  2.0 1.8  PHOS 5.1*  --   --  3.9 3.1  GLUCOSE  203*  < > 239* 232* 189*  < > = values in this interval not displayed.  CBG (last 3)   Recent Labs  11/15/13 0025 11/15/13 0408 11/15/13 0726  GLUCAP 232* 193* 175*    Scheduled Meds: . atorvastatin  40 mg Oral q1800  . carvedilol  6.25 mg Oral BID WC  . feeding supplement (GLUCERNA SHAKE)  237 mL Oral BID BM  . heparin  5,000 Units Subcutaneous Q8H  . insulin aspart  0-20 Units Subcutaneous Q4H  . OxyCODONE  20 mg Oral Q12H  . sodium chloride  10-40 mL Intracatheter Q12H  . sodium chloride  3 mL Intravenous Q12H    Continuous Infusions: . dextrose 5 % and 0.9% NaCl 40 mL/hr at 11/14/13 1100  . TPN (CLINIMIX) Adult without lytes 80 mL/hr at 11/14/13 1802   And  . fat emulsion 250 mL (11/14/13 1802)  . TPN (CLINIMIX) Adult without lytes     And  . fat emulsion      Atlee Abide MS RD LDN Clinical Dietitian [phone]

## 2013-11-15 NOTE — Progress Notes (Signed)
Patient up to chair for approx. 2h.  Patient requests to get back to bed for the afternoon.  Educated patient regarding important of mobilization while in the hospital.  Patient verbally confirmed understanding.  Patient back to bed, will continue to monitor.

## 2013-11-15 NOTE — Progress Notes (Signed)
Received call from IV nurse stating that she needs confirmation from the nephrologist that patient is ok to have a PICC prior to inserting the PICC line.  Requested that this nurse page them for confirmation.  Requested that nurse then call her back.  Will page [facility] on-call physician, and call IV nurse back with final response.

## 2013-11-15 NOTE — Progress Notes (Signed)
Have spoken with [facility] physician on call.  Stated okay to proceed with PICC line insertion.  Have paged IV team to let them know.

## 2013-11-15 NOTE — Progress Notes (Signed)
PT Cancellation Note  Patient Details Name: TAREQ FERRAR MRN: [id number] DOB: 01-25-1951   Cancelled Treatment:    Reason Eval/Treat Not Completed: Patient declined, no reason specified--requested PT check back on tomorrow.    Weston Anna, MPT Pager: [phone]

## 2013-11-16 LAB — BASIC METABOLIC PANEL
BUN: 33 mg/dL — AB (ref 6–23)
CHLORIDE: 99 meq/L (ref 96–112)
CO2: 22 meq/L (ref 19–32)
CREATININE: 1.37 mg/dL — AB (ref 0.50–1.35)
Calcium: 9.1 mg/dL (ref 8.4–10.5)
GFR calc non Af Amer: 55 mL/min — ABNORMAL LOW (ref >90)
GFR, EST AFRICAN AMERICAN: 63 mL/min — AB (ref >90)
GLUCOSE: 184 mg/dL — AB (ref 70–99)
Potassium: 4.6 mEq/L (ref 3.7–5.3)
Sodium: 132 mEq/L — ABNORMAL LOW (ref 137–147)

## 2013-11-16 LAB — GLUCOSE, CAPILLARY
GLUCOSE-CAPILLARY: 168 mg/dL — AB (ref 70–99)
GLUCOSE-CAPILLARY: 175 mg/dL — AB (ref 70–99)
GLUCOSE-CAPILLARY: 185 mg/dL — AB (ref 70–99)
GLUCOSE-CAPILLARY: 210 mg/dL — AB (ref 70–99)
GLUCOSE-CAPILLARY: 222 mg/dL — AB (ref 70–99)
Glucose-Capillary: 155 mg/dL — ABNORMAL HIGH (ref 70–99)
Glucose-Capillary: 186 mg/dL — ABNORMAL HIGH (ref 70–99)

## 2013-11-16 LAB — MAGNESIUM: Magnesium: 1.5 mg/dL (ref 1.5–2.5)

## 2013-11-16 LAB — PHOSPHORUS: Phosphorus: 3.4 mg/dL (ref 2.3–4.6)

## 2013-11-16 MED ORDER — TRACE MINERALS CR-CU-F-FE-I-MN-MO-SE-ZN IV SOLN
INTRAVENOUS | Status: DC
  Filled 2013-11-16: qty 2000

## 2013-11-16 MED ORDER — SODIUM CHLORIDE 0.9 % IV SOLN
INTRAVENOUS | Status: DC
  Administered 2013-11-16 – 2013-11-17 (×2): via INTRAVENOUS

## 2013-11-16 MED ORDER — TRACE MINERALS CR-CU-F-FE-I-MN-MO-SE-ZN IV SOLN
INTRAVENOUS | Status: AC
  Administered 2013-11-16: 18:00:00 via INTRAVENOUS
  Filled 2013-11-16: qty 2000

## 2013-11-16 MED ORDER — MAGNESIUM SULFATE 40 MG/ML IJ SOLN
2.0000 g | Freq: Once | INTRAMUSCULAR | Status: AC
  Administered 2013-11-16: 2 g via INTRAVENOUS
  Filled 2013-11-16: qty 50

## 2013-11-16 MED ORDER — FAT EMULSION 20 % IV EMUL
250.0000 mL | INTRAVENOUS | Status: DC
  Filled 2013-11-16: qty 250

## 2013-11-16 MED ORDER — FAT EMULSION 20 % IV EMUL
250.0000 mL | INTRAVENOUS | Status: AC
  Administered 2013-11-16: 250 mL via INTRAVENOUS
  Filled 2013-11-16: qty 250

## 2013-11-16 NOTE — Progress Notes (Signed)
PT Cancellation Note  Patient Details Name: RYSHAWN WIEGAND MRN: [id number] DOB: 01/27/1951   Cancelled Treatment:    Reason Eval/Treat Not Completed: Medical issues which prohibited therapy (and other obstacles  for evaluation. return 2/11.)   Claretha Cooper 11/16/2013, 3:10 PM Tresa Endo PT [phone]

## 2013-11-16 NOTE — Progress Notes (Signed)
[facility] PROGRESS NOTE  Kurt Cross [id number] DOB: 11/14/1950 DOA: 11/11/2013 PCP: [name], MD  Asessment/Plan: sepsis syndrome/hypotension -PICC line was removed in ED and secondary  to pain-catheter tip cultures so far with no growth -Hypotension resolved s/p IVF, more likely secondary to hypovolemia -blood and catheter tip cultures neg, and per surgery wound not infected and no further antibiotics needed -Patient has remained afebrile off abx -Appreciate surgery assistance with abdominal wound care. ARF with Hyperkalemia and metab acidosis -multifactorial- was on lasix, spironolactone, kcl supplements, ACEI and hypotensive on admit -above listed med dc'ed at this time -Creatinine continues to trend down with hydration, acidosis resolved>> renal signed off 2/7. -Hyperkalemia resolved -Follow and recommend resuming Lasix/lisinopril in am if cr normalized Active Problems:  Diabetes mellitus, insulin dependent (IDDM), uncontrolled  -continue SSI for now Complicated abd wall abscess /Infected prosthetic mesh s/p I&D and removal 08/21/2013  -Appreciate surgery  asssistance >>patient is scheduled to follow up in [place] for further eval and recs -Continuing TNA, calorie count in progress to determine continued need for TNA as patient tolerating dysphagia diet  -Eakins pouch suction turned off per surgery this a.m, and Eakins change planned in am -He is status post new PICC line placement on 2/9 -CM/SW working on discharge options/planning chronic systolic CHF, status post AICD -Continue monitor fluid status closely off diuretics d/t ARF, compensated at this time -Continue Coreg -Creatinine continuing to improve, above follow and possibly resume Lasix in a.m. if creatinine normalized OSA  Afib/Flutter -rate controlled, continue coreg    Code Status: full Family Communication: none at bedside Disposition Plan: Transfer to telemetry   Consultants:  CCM s/o  2/7  Renal s/o 2/7  CCS  Procedures:  none  Antibiotics:  Vanc started on 2/5  Zosyn started on 2/5  HPI/Subjective: Denies any new complaints-no shortness of breath, tolerating dysphagia diet  Objective: Filed Vitals:   11/16/13 1423  BP: 148/62  Pulse: 60  Temp: 98.1 F (36.7 C)  Resp: 18    Intake/Output Summary (Last 24 hours) at 11/16/13 1526 Last data filed at 11/16/13 1300  Gross per 24 hour  Intake   1463 ml  Output   2425 ml  Net   -962 ml   Filed Weights   11/12/13 0500 11/12/13 0659  Weight: 188 kg (414 lb 7.4 oz) 167 kg (368 lb 2.7 oz)    Exam:  General: easily aroused & oriented x 3 In NAD Cardiovascular: RRR, nl S1 s2 Respiratory: CTAB             Abdomen: soft +BS mild tenderness/ND, abd wound with Eakin's pouch, wound appears clean  Extremities: No cyanosis and no edema    Data Reviewed: Basic Metabolic Panel:  Recent Labs Lab 11/12/13 1725  11/13/13 0620  11/13/13 2230 11/14/13 0205 11/14/13 0500 11/15/13 0425 11/16/13 0340  NA 130*  129*  < > 131*  < > 133* 132* 136* 134* 132*  K 6.5*  6.4*  < > 6.0*  < > 5.4* 5.1 5.3 4.8 4.6  CL 99  98  < > 98  < > 98 98 102 100 99  CO2 17*  17*  < > 22  < > 24 23 25 24 22   GLUCOSE 220*  221*  < > 203*  < > 281* 239* 232* 189* 184*  BUN 113*  113*  < > 92*  < > 68* 66* 62* 40* 33*  CREATININE 2.78*  2.77*  < > 2.54*  < >  2.03* 1.91* 1.91* 1.56* 1.37*  CALCIUM 9.9  9.8  < > 9.9  < > 9.3 9.0 9.3 9.4 9.1  MG  --   --  2.2  --   --   --  2.0 1.8 1.5  PHOS 5.6*  --  5.1*  --   --   --  3.9 3.1 3.4  < > = values in this interval not displayed. Liver Function Tests:  Recent Labs Lab 11/11/13 2334 11/12/13 1725 11/14/13 0500 11/15/13 0425  AST 22  --  53* 32  ALT 57*  --  111* 84*  ALKPHOS 54  --  42 41  BILITOT 0.6  --  0.6 0.6  PROT 8.9*  --  7.3 7.8  ALBUMIN 3.2* 2.9* 2.7* 2.7*   No results found for this basename: LIPASE, AMYLASE,  in the last 168 hours No results found  for this basename: AMMONIA,  in the last 168 hours CBC:  Recent Labs Lab 11/11/13 2334 11/11/13 2346 11/12/13 0149 11/13/13 0620 11/14/13 0500 11/15/13 0425  WBC 16.1*  --   --  12.3* 10.1 10.8*  NEUTROABS 11.8*  --   --   --  6.5 6.8  HGB 13.4 15.3 13.3 11.7* 11.2* 11.5*  HCT 39.5 45.0 39.0 35.0* 34.0* 34.9*  MCV 90.2  --   --  90.0 91.2 91.6  PLT 277  --   --  259 238 258   Cardiac Enzymes: No results found for this basename: CKTOTAL, CKMB, CKMBINDEX, TROPONINI,  in the last 168 hours BNP (last 3 results)  Recent Labs  08/01/13 1649  PROBNP 481.4*   CBG:  Recent Labs Lab 11/15/13 2007 11/16/13 0008 11/16/13 0425 11/16/13 0726 11/16/13 1202  GLUCAP 267* 185* 175* 155* 186*    Recent Results (from the past 240 hour(s))  CULTURE, BLOOD (ROUTINE X 2)     Status: None   Collection Time    11/11/13 11:37 PM      Result Value Range Status   Specimen Description BLOOD RIGHT BICEP   Final   Special Requests BOTTLES DRAWN AEROBIC AND ANAEROBIC 5CC   Final   Culture  Setup Time     Final   Value: 11/12/2013 03:29     Performed at [facility]   Culture     Final   Value:        BLOOD CULTURE RECEIVED NO GROWTH TO DATE CULTURE WILL BE HELD FOR 5 DAYS BEFORE ISSUING A FINAL NEGATIVE REPORT     Performed at [facility]   Report Status PENDING   Incomplete  CULTURE, BLOOD (ROUTINE X 2)     Status: None   Collection Time    11/11/13 11:37 PM      Result Value Range Status   Specimen Description BLOOD LEFT HAND   Final   Special Requests BOTTLES DRAWN AEROBIC AND ANAEROBIC 4CC   Final   Culture  Setup Time     Final   Value: 11/12/2013 03:28     Performed at [facility]   Culture     Final   Value:        BLOOD CULTURE RECEIVED NO GROWTH TO DATE CULTURE WILL BE HELD FOR 5 DAYS BEFORE ISSUING A FINAL NEGATIVE REPORT     Performed at [facility]   Report Status PENDING   Incomplete  CATH TIP CULTURE     Status: None   Collection  Time    11/12/13  2:53 AM      Result Value Range Status   Specimen Description CATH TIP   Final   Special Requests NONE   Final   Culture     Final   Value: NO GROWTH 2 DAYS     Performed at [facility]   Report Status 11/14/2013 FINAL   Final  MRSA PCR SCREENING     Status: None   Collection Time    11/12/13  5:34 AM      Result Value Range Status   MRSA by PCR NEGATIVE  NEGATIVE Final   Comment:            The GeneXpert MRSA Assay (FDA     approved for NASAL specimens     only), is one component of a     comprehensive MRSA colonization     surveillance program. It is not     intended to diagnose MRSA     infection nor to guide or     monitor treatment for     MRSA infections.  URINE CULTURE     Status: None   Collection Time    11/13/13  6:30 AM      Result Value Range Status   Specimen Description URINE, CATHETERIZED   Final   Special Requests NONE   Final   Culture  Setup Time     Final   Value: 11/13/2013 14:03     Performed at [facility]     Final   Value: NO GROWTH     Performed at [facility]   Culture     Final   Value: NO GROWTH     Performed at [facility]   Report Status 11/14/2013 FINAL   Final     Studies: Dg Chest Port 1 View  11/15/2013   CLINICAL DATA:  Evaluate central venous catheter placement  EXAM: PORTABLE CHEST - 1 VIEW  COMPARISON:  DG CHEST 1V PORT dated 11/12/2013  FINDINGS: A right-sided central venous catheter is appreciated with tip projecting in the region of the superior vena caval right atrial junction. There is no evidence of a pneumothorax. This is via a right subclavian approach. A right internal jugular catheter is appreciated with tip projecting in the regions superior vena cava. The lung volumes are low. No focal regions of consolidation or focal infiltrates. Cardiac silhouette is enlarged. A left-sided cardiac pacing unit is appreciated projecting within the apical periphery of chest  wall. The tips project in the region the right atrium and right ventricle. The osseous structures unremarkable.  IMPRESSION: Right-sided central venous catheter with tip projecting in the region of the superior vena caval right atrial junction. This catheter has been placed in the interim. There is no evidence for pneumothorax. Otherwise no further change in the chest radiograph.   Electronically Signed   By: Margaree Mackintosh M.D.   On: 11/15/2013 18:44    Scheduled Meds: . atorvastatin  40 mg Oral q1800  . carvedilol  6.25 mg Oral BID WC  . feeding supplement (GLUCERNA SHAKE)  237 mL Oral BID BM  . heparin  5,000 Units Subcutaneous Q8H  . insulin aspart  0-20 Units Subcutaneous Q4H  . OxyCODONE  20 mg Oral Q12H  . sodium chloride  10-40 mL Intracatheter Q12H  . sodium chloride  3 mL Intravenous Q12H   Continuous Infusions: . sodium chloride 40 mL/hr at 11/16/13 0813  . TPN (CLINIMIX) Adult without  lytes 80 mL/hr at 11/15/13 1956   And  . fat emulsion 250 mL (11/15/13 1956)  . fat emulsion     And  . TPN (CLINIMIX) Adult without lytes      Principal Problem:   AKI (acute kidney injury) Active Problems:   Diabetes mellitus, insulin dependent (IDDM), uncontrolled   Obesity, Class III, BMI 40-49.9 (morbid obesity)   OBSTRUCTIVE SLEEP APNEA   Infected prosthetic mesh of abdominal wall with GIANT abscess s/p removal 08/14/2013   Jejunal SB fistula into abdominal wound   Atrial fibrillation/flutter   Short gut syndrome due to distal jejunal SB fistula   Hyperkalemia   Severe sepsis with acute organ dysfunction   Time spent:25    Caid Radin C  [facility] Pager [phone]. If 7PM-7AM, please contact night-coverage at [email or web address], password [email or web address] 11/16/2013, 3:26 PM  LOS: 5 days

## 2013-11-16 NOTE — Progress Notes (Signed)
[facility]  Patient Status: Active pt with [facility]  [facility] is providing the following services: HHRN, PT, OT, HHA and [facility] for home TNA.    Kurt Cross, [facility] Transition to Home Specialist and I met with pt and wife today to discuss [facility] support for home care services.  Pt verbalizes he desires to DC back home and not to a SNF.   We have outlined his needs from [facility] as we prepare to support his DC back home: - Eakins pouch (fistula supplies)- [facility] is working directly with [place] to make arrangement for pouches PRIOR to   DC home.  Pouch acquisition has not yet been finalized.  We have made contact with  [facility]'s representative at [facility]. She is working with her Printmaker to support a plan/resolve.  - Hospital Bed- pt needed a "heavy duty" bed which has been delivered to the home. Pt encouraged to have an overhead trapeze to support maneuvering in bed and to reduce tension on abdomen with pulling self in/out of bed. - Trapeze bar will be delivered/set up at the home tonight. -Wheelchair- pt needs "heavy duty" chair. WC will also be delivered to the home tonight.  [facility] has worked to support delayed copay for pt until March due to patient's income structure.  [facility] will be prepared to support transition home when deemed appropriate by MD team and final arrangements made for appropriate fistula/ostomy pouches.   If patient discharges after hours, please call [phone].   Larry Sierras 11/16/2013, 3:11 PM

## 2013-11-16 NOTE — Progress Notes (Addendum)
Adv to mech soft/Dys 3 diet.  Cont cal counts.  Try to wean off TNA (or at least cycle?) if safe.  Stop suction in Eakins pouch - not working great & pt/wife not needing it at home anyway.  See if we can avoid it.  Empty it more regularly like any ileostomy bag.  Tx options will be easier/broader if avoid pouch suction & can wean off TNA.    Needs appt w surgeon at [facility].  Evaluate at [facility]. Appt for 25 February (appt moved since currently in hospital):   Dr Larey Dresser  [facility]  [address]  [place], [place] [address]  [phone]

## 2013-11-16 NOTE — Progress Notes (Signed)
OT Cancellation Note  Patient Details Name: Kurt Cross MRN: [id number] DOB: Feb 22, 1951   Cancelled Treatment:    Reason Eval/Treat Not Completed: Other (comment)  Attempted eval 3xs:  Pt has had a busy day and is unable to participate now.   We will return tomorrow.    Amanada Philbrick 11/16/2013, 2:20 PM [name], OTR/L [phone] 11/16/2013

## 2013-11-16 NOTE — Discharge Instructions (Signed)
Note the ostomy is a "Jejunal enterocutaneous fistula" - sign above bed. & note in the computer.  Empty ostomy pouch every 6 hours & as needed  Wound care to midline abdominal wound with stomatizesd fistula:  Remove old pouch and discard 1-2x/week,  Cleanse skin and wound with warm water, pat gently dry.  Apply skin barrier ring in strips around periphery of wound.  Trace pattern onto Large Eakin pouch Kellie Simmering [phone] and cut out center of pouch.  Peel off pouch backing and place over wound using hands to warm adhesive barrier.  Picture frame pouch with 2-inch Medipore tape.   Change as needed but at least once weekly.  Ostomy Support Information  Yes, Kurt Cross heard that people get along just fine with only one of their eyes, or one of their lungs, or one of their kidneys. But you also know that you have only one intestine and only one bladder, and that leaves you feeling awfully empty, both physically and emotionally: You think no other people go around without part of their intestine with the ends of their intestines sticking out through their abdominal walls.  Well, you are wrong! There are nearly three quarters of a million people in the [place] who have an ostomy; people who have had surgery to remove all or part of their colons or bladders. There is even a national association, the [place] [employer] [place] with over 350 local affiliated support groups that are organized by volunteers who provide peer support and counseling. Juan Quam has a toll free telephone num-ber, ([phone] and an educational,  interactive website, www.ostomy.org   An ostomy is an opening in the belly (abdominal wall) made by surgery. Ostomates are people who have had this procedure. The opening (stoma) allows the kidney or bowel to discharge waste. An external pouch covers the stoma to collect waste. Pouches are are a simple bag and are odor free. Different companies have disposable or reusable pouches to fit one's  lifestyle. An ostomy can either be temporary or permanent.  THERE ARE THREE MAIN TYPES OF OSTOMIES  Colostomy. A colostomy is a surgically created opening in the large intestine (colon).  Ileostomy. An ileostomy is a surgically created opening in the small intestine.  Urostomy. A urostomy is a surgically created opening to divert urine away from the bladder. FREQUENTLY ASKED QUESTIONS   Why havent you met any of these folks who have an ostomy?  Well, maybe you have! You just did not recognize them because an ostomy doesn't show. It can be kept secret if you wish. Why, maybe some of your best friends, office associates or neighbors have an ostomy ... you never can tell.   People facing ostomy surgery have many quality-of-life questions like:  Will you bulge? Smell? Make noises? Will you feel waste leaving your body? Will you be a captive of the toilet? Will you starve? Be a social outcast? Get/stay married? Have babies? Easily bathe, go swimming, bend over?  OK, lets look at what you can expect:  Will you bulge?  Remember, without part of the intestine or bladder, and its contents, you should have a flatter tummy than before. You can expect to wear, with little exception, what you wore before surgery ... and this in-cludes tight clothing and bathing suits.  Will you smell?  Today, thanks to modern odor proof pouching systems, you can walk into an ostomy support group meeting and not smell anything that is foul or offensive. And, for those with an ileostomy  or colostomy who are concerned about odor when emptying their pouch, there are in-pouch deodorants that can be used to eliminate any waste odors that may exist.  Will you make noises?  Everyone produces gas, especially if they are an air-swallower. But intestinal sounds that occur from time to time are no differ-ent than a gurgling tummy, and quite often your clothing will muffle any sounds.   Will you feel the waste discharges?  For  those with a colostomy or ileostomy there might be a slight pressure when waste leaves your body, but understand that the intestines have no nerve endings, so there will be no unpleasant sensations. Those with a urostomy will probably be unaware of any kidney drainage.  Will you be a captive of the toilet?  Immediately post-op you will spend more time in the bathroom than you will after your body recovers from surgery. Every person is different, but on average those with an ileostomy or urostomy may empty their pouches 4 to 6 times a day; a little  less if you have a colostomy. The average wear time between pouch system changes is 3 to 5 days and the changing process should take less than 30 minutes.  Will I need to be on a special diet? Most people return to their normal diet when they have recovered from surgery. Be sure to chew your food well, eat a well-balanced diet and drink plenty of fluids. If you experience problems with a certain food, wait a couple of weeks and try it again. Will there be odor and noises? Pouching systems are designed to be odor-proof or odor-resistant. There are deodorants that can be used in the pouch. Medications are also available to help reduce odor. Limit gas-producing foods and carbonated beverages. You will experience less gas and fewer noises as you heal from surgery. How much time will it take to care for my ostomy? At first, you may spend a lot of time learning about your ostomy and how to take care of it. As you become more comfortable and skilled at changing the pouching system, it will take very little time to care for it.  Will I be able to return to work? People with ostomies can perform most jobs. As soon as you have healed from surgery, you should be able to return to work. Heavy lifting (more than 10 pounds) may be discouraged.  What about intimacy? Sexual relationships and intimacy are important and fulfilling aspects of your life. They should continue  after ostomy surgery. Intimacy-related concerns should be discussed openly between you and your partner.  Can I wear regular clothing? You do not need to wear special clothing. Ostomy pouches are fairly flat and barely noticeable. Elastic undergarments will not hurt the stoma or prevent the ostomy from functioning.  Can I participate in sports? An ostomy should not limit your involvement in sports. Many people with ostomies are runners, skiers, swimmers or participate in other active lifestyles. Talk with your caregiver first before doing heavy physical activity.  Will you starve?  Not if you follow doctors orders at each stage of your post-op adjustment. There is no such thing as an ostomy diet. Some people with an ostomy will be able to eat and tolerate anything; others may find diffi-culty with some foods. Each person is an individual and must determine, by trial, what is best for them. A good practice for all is to drink plenty of water.  Will you be a social outcast?  Have you  met anyone who has an ostomy and is a social outcast? Why should you be the first? Only your attitude and self image will effect how you are treated. No confi-dent person is an Occupational psychologist.   PROFESSIONAL HELP  Resources are available if you need help or have questions about your ostomy.    Specially trained nurses called Wound, Ostomy Continence Nurses (WOCN) are available for consultation in most major medical centers.   Consider getting an ostomy consult with Cena Benton at [facility] to help troubleshoot collagen fittings and other issues with your ostomy: [phone]   The [place] (UOA) is a group made up of many local chapters throughout the [place]. These local groups hold meetings and provide support to prospective and existing ostomates. They sponsor educational events and have qualified visitors to make personal or telephone visits. Contact the UOA for the chapter  nearest you and for other educational publications.  More detailed information can be found in Colostomy Guide, a publication of the [employer] (UOA). Contact UOA at 1-585-868-7461 or visit their web site at https://arellano.com/. The website contains links to other sites, suppliers and resources. Document Released: 09/26/2003 Document Revised: 12/16/2011 Document Reviewed: 01/25/2009 [facility] Patient Information 2013 [place].  Calorie Counting Diet A calorie counting diet requires you to eat the number of calories that are right for you in a day. Calories are the measurement of how much energy you get from the food you eat. Eating the right amount of calories is important for staying at a healthy weight. If you eat too many calories, your body will store them as fat and you may gain weight. If you eat too few calories, you may lose weight. Counting the number of calories you eat during a day will help you know if you are eating the right amount. A Registered Dietitian can determine how many calories you need in a day. The amount of calories needed varies from person to person. If your goal is to lose weight, you will need to eat fewer calories. Losing weight can benefit you if you are overweight or have health problems such as heart disease, high blood pressure, or diabetes. If your goal is to gain weight, you will need to eat more calories. Gaining weight may be necessary if you have a certain health problem that causes your body to need more energy. TIPS Whether you are increasing or decreasing the number of calories you eat during a day, it may be hard to get used to changes in what you eat and drink. The following are tips to help you keep track of the number of calories you eat.  Measure foods at home with measuring cups. This helps you know the amount of food and number of calories you are eating.  Restaurants often serve food in amounts that are larger than 1 serving. While eating  out, estimate how many servings of a food you are given. For example, a serving of cooked rice is  cup or about the size of half of a fist. Knowing serving sizes will help you be aware of how much food you are eating at restaurants.  Ask for smaller portion sizes or child-size portions at restaurants.  Plan to eat half of a meal at a restaurant. Take the rest home or share the other half with a friend.  Read the Nutrition Facts panel on food labels for calorie content and serving size. You can find out how many servings are  in a package, the size of a serving, and the number of calories each serving has.  For example, a package might contain 3 cookies. The Nutrition Facts panel on that package says that 1 serving is 1 cookie. Below that, it will say there are 3 servings in the container. The calories section of the Nutrition Facts label says there are 90 calories. This means there are 90 calories in 1 cookie (1 serving). If you eat 1 cookie you have eaten 90 calories. If you eat all 3 cookies, you have eaten 270 calories (3 servings x 90 calories = 270 calories). The list below tells you how big or small some common portion sizes are.  1 oz.........4 stacked dice.  3 oz........[name] KitchenDeck of cards.  1 tsp.......[name] KitchenTip of little finger.  1 tbs......[name] KitchenMarland KitchenThumb.  2 tbs.......[name] KitchenGolf ball.   cup......[name] KitchenHalf of a fist.  1 cup.......[name] KitchenA fist. KEEP A FOOD LOG Write down every food item you eat, the amount you eat, and the number of calories in each food you eat during the day. At the end of the day, you can add up the total number of calories you have eaten. It may help to keep a list like the one below. Find out the calorie information by reading the Nutrition Facts panel on food labels. Breakfast  Bran cereal (1 cup, 110 calories).  Fat-free milk ( cup, 45 calories). Snack  Apple (1 medium, 80 calories). Lunch  Spinach (1 cup, 20 calories).  Tomato ( medium, 20 calories).  Chicken breast  strips (3 oz, 165 calories).  Shredded cheddar cheese ( cup, 110 calories).  Light [place] dressing (2 tbs, 60 calories).  Whole-wheat bread (1 slice, 80 calories).  Tub margarine (1 tsp, 35 calories).  Vegetable soup (1 cup, 160 calories). Dinner  Pork chop (3 oz, 190 calories).  Brown rice (1 cup, 215 calories).  Steamed broccoli ( cup, 20 calories).  Strawberries (1  cup, 65 calories).  Whipped cream (1 tbs, 50 calories). Daily Calorie Total: 0998 Document Released: 09/23/2005 Document Revised: 12/16/2011 Document Reviewed: 03/20/2007 [facility] Patient Information 2014 [place].

## 2013-11-16 NOTE — Progress Notes (Signed)
Patient not ready for CPAP at this time.  Will notify RT when ready to be placed on CPAP.

## 2013-11-16 NOTE — Progress Notes (Addendum)
PARENTERAL NUTRITION CONSULT NOTE - Follow up  Pharmacy Consult for TPN Indication: Enterocutaneous fistula  No Known Allergies  Patient Measurements: Height: 5\' 11"  (180.3 cm) Weight: 368 lb 2.7 oz (167 kg) IBW/kg (Calculated) : 75.3 Adjusted Body Weight: 112kg  Vital Signs: Temp: 97.5 F (36.4 C) (02/10 0423) Temp src: Oral (02/10 0423) BP: 137/60 mmHg (02/10 0423) Pulse Rate: 59 (02/10 0423) Intake/Output from previous day: 02/09 0701 - 02/10 0700 In: 2723 [P.O.:480; I.V.:640; TPN:993] Out: 1925 [Urine:1125; Drains:800]  Labs:  Recent Labs  11/14/13 0500 11/15/13 0425  WBC 10.1 10.8*  HGB 11.2* 11.5*  HCT 34.0* 34.9*  PLT 238 258     Recent Labs  11/14/13 0500 11/15/13 0425 11/16/13 0340  NA 136* 134* 132*  K 5.3 4.8 4.6  CL 102 100 99  CO2 25 24 22   GLUCOSE 232* 189* 184*  BUN 62* 40* 33*  CREATININE 1.91* 1.56* 1.37*  CALCIUM 9.3 9.4 9.1  MG 2.0 1.8 1.5  PHOS 3.9 3.1 3.4  PROT 7.3 7.8  --   ALBUMIN 2.7* 2.7*  --   AST 53* 32  --   ALT 111* 84*  --   ALKPHOS 42 41  --   BILITOT 0.6 0.6  --   PREALBUMIN 21.3 21.9  --   TRIG 79 82  --    Estimated Creatinine Clearance: 90.8 ml/min (by C-G formula based on Cr of 1.37).    Recent Labs  11/15/13 2007 11/16/13 0008 11/16/13 0425  GLUCAP 267* 185* 175*    Insulin Requirements in the past 24 hours:  CBGs range 175 - 267 48 units of moderate SSI q4h. 80 units/ 2L TNA (receiving ~75 units/24hr with TNA rate at 1.92L/24h)  PTA insulin included:  NPH 40 units BID, Novolog 12 units BID, Regular insulin in TPN 60 units/3L/24 hrs.  Nutritional Goals:  Previous goals per RD: [id number] kcal/day, 160-180 grams/day protein, 3.3-3.5 L/day fluid.  Clinimix E 5/15 at goal rate 125 ml/hr (3L/day) with 20% lipids at 10 ml/hr to provide 150g protein and 2610 kcal daily. Due to TNA bag capacity, maximum volume is 3L per 24 hrs. This rate will meet >/= 90% of estimated needs.  Current Nutrition:  Diet:  Dysphagia 1 diet 2/9 - Glucerna shakes BID IVF:  D5NS @ 40 ml/hr. TNA: Clinimix 5/15 @ 80 ml/hr, Lipids @ 10 ml/hr  Assessment:  63yo M known to pharmacy from recent admission. Readmitted 2/5 with hyperkalemia, generalized weakness, suspected PICC site infection. On TNA at home per [facility] d/t abdominal abscess and EC fistula. TNA and PICC were removed on admission.  Central access with right IJ was placed 2/6. Pharmacy is asked to resume TNA on 2/7.  Calorie count in progress, started 2/9. Current TNA at 80 ml/hr providing ~60% total nutritional needs.  Labs:  Glucose: Hx DM on insulin with previous TPN. CBGs consistently elevated, even prior to TPN restarting at 1800 on 2/7. Receiving SSI and insulin in TPN, will cont to titrate.  Goal 90-180.  Electrolytes: Na remains slightly low. K, Phos, Mag, and others WNL - NO electrolytes in current TNA Renal: BUN/SCr elevated but improving daily LFTs: AST and ALT elevated on 2/8 but improved 2/9.  TGs: wnl - 67 (1/5), 82 (2/9) Prealbumin: 18.1(1/5), 21.3 (2/8), 21.9 (2/9)   Plan:  Continue Clinimix 5/15 at 27ml/hr. NO ELECTROLYTES.  Do not advance rate due to hyperglycemia and diet advancement. Continue 20% fat emulsion at 6ml/hr. TNA to contain standard multivitamins and trace  elements. Since Mg is trending down, will give 2g IV bolus today Continue IVF at 65ml/hr - will change to just NS (remove dextrose) Increase regular insulin in TNA to 45 units/L. (90 units/2L bag) Continue SSI resistant scale q4h.  TNA lab panels on Mondays & Thursdays. F/u daily.  Peggyann Juba, PharmD, BCPS Pager: [phone]  11/16/2013 7:38 AM   Addendum: Based on discussion with Dr. Johney [place] and notes from RD, will proceed with weaning TNA starting tonight at 1800 - Decrease TNA to 40 ml/hr and lipids to 5 ml/hr.  Peggyann Juba, PharmD, BCPS 11/16/2013 3:12 PM

## 2013-11-16 NOTE — Progress Notes (Signed)
Calorie Count Note  48 hour calorie count ordered.  Diet: Dys1 for 2 meals, Dys3 for 1 meal Supplements: Glucerna BID  Breakfast: 431 kcal, 14 gram protein Lunch: 397 kcal, 13 gram protein Dinner: 411 kcal, 20 gram protein (info from 2/09) Supplements:440 kcal, 20 gram protein  Total intake: 1459 kcal (62% of minimum estimated needs)  57 gram protein (36% of minimum estimated needs)   Estimated needs:  Kcal: 2370-2650  Protein: 160-180g  Fluid: >2.6L/day    -Pt denied any nausea/abd pain post meals. Noted feelings of early satiety. Pt tolerating Dys3 diet advancement.  Pt may have more appetite if TPN rate decreased. Diet advanced to Dys3 on 2/10 Lunch.  Nutrition Dx: Inadequate oral intake related to inability to eat as evidenced by NPO- now r/t to PO intake < 75%   Goal: PO and TPN to meet >/= 90% of their estimated nutrition needs    Intervention:  -Encouraged PO intake -Continue with Glucerna supplement BID -TPN per pharmacy. May consider decreasing rate to stimulate appetite  Atlee Abide MS RD LDN Clinical Dietitian [id number]

## 2013-11-16 NOTE — Clinical Social Work Psychosocial (Signed)
     Clinical Social Work Department BRIEF PSYCHOSOCIAL ASSESSMENT 11/16/2013  Patient:  Kurt Cross, Kurt Cross     Account Number:  [id number]     Admit date:  11/11/2013  Clinical Social Worker:  Venia Minks  Date/Time:  11/16/2013 12:00 M  Referred by:  Physician  Date Referred:  11/16/2013 Referred for  SNF Placement   Other Referral:   Interview type:  Patient Other interview type:    PSYCHOSOCIAL DATA Living Status:  FAMILY Admitted from facility:   Level of care:   Primary support name:  Niles Ess Primary support relationship to patient:  SPOUSE Degree of support available:   good    CURRENT CONCERNS Current Concerns  Post-Acute Placement   Other Concerns:    SOCIAL WORK ASSESSMENT / PLAN CSW met with patient. patient is alert and oriented X3. Patient is known to CSW from previous admission. Currently, same barriers are in place in regards to continuous wall suction and TPN. However, if these barriers change, Patient is agreeable to getting faxed out and recieving bed offers once we know that. Patient is agreeable to snf as long as there is a facility that is local and able to meet his needs and insurance costs isnt too expensive. CSW explained [employer] copays. patient is agreeable. Patient is a little frustrated about being on TPN as he feels it is making him worse. CSW offered emotional support.   Assessment/plan status:   Other assessment/ plan:   Information/referral to community resources:    PATIENTS/FAMILYS RESPONSE TO PLAN OF CARE: Patient is agreeable to recieving bed offers but is hesitant to going ot snf. He is hopeful to go to [place] and get his surgery that he was scheduled for.

## 2013-11-16 NOTE — Progress Notes (Signed)
Came to speak with patient at bedside. Discussed how he has declined [facility] home visits. States " I was getting confused on who was calling the house".Explained that [facility] and home health are not the same and that [facility] will not replace or interfere with home health services. Also spoke with patient's wife as well who confirms that patient gets frustrated from calls and it is okay for [facility] Coordinator to call her to schedule a visit. Confirmed [name] Alcorta's cell number as [phone]. Noted patient may go to SNF. Will continue to follow along. Left contact information at bedside for patient and wife. Spoke with inpatient RNCM about conversation with patient. Marthenia Rolling, MSN, RN,BSN- [facility] [phone]

## 2013-11-16 NOTE — Progress Notes (Signed)
Subjective: He is doing well, eating a dysphagia diet and on TNA.  He has a suction catheter in his ostomy bag, but he has done well without it at home.  Everything is coming out thru the fistula, both liquid and more solid effluent. Lynann Beaver pouch is in place so I can't fully examine wound.    Objective: Vital signs in last 24 hours: Temp:  [97.5 F (36.4 C)-98.4 F (36.9 C)] 97.5 F (36.4 C) (02/10 0423) Pulse Rate:  [59-72] 59 (02/10 0423) Resp:  [16-20] 16 (02/10 0423) BP: (111-141)/(60-74) 137/60 mmHg (02/10 0423) SpO2:  [93 %-100 %] 99 % (02/10 0423) Last BM Date: 11/15/13 (jejunostomy enterocutaneous fistula) 480 PO,  800 from the drain Afebrile, VSS Creatinine is improving Intake/Output from previous day: 02/09 0701 - 02/10 0700 In: 2723 [P.O.:480; I.V.:640; TPN:993] Out: 1925 [Urine:1125; Drains:800] Intake/Output this shift:    General appearance: alert, cooperative and no distress GI: large open wound with fistula and large amount of effuent coming from it.  Lab Results:   Recent Labs  11/14/13 0500 11/15/13 0425  WBC 10.1 10.8*  HGB 11.2* 11.5*  HCT 34.0* 34.9*  PLT 238 258    BMET  Recent Labs  11/15/13 0425 11/16/13 0340  NA 134* 132*  K 4.8 4.6  CL 100 99  CO2 24 22  GLUCOSE 189* 184*  BUN 40* 33*  CREATININE 1.56* 1.37*  CALCIUM 9.4 9.1   PT/INR No results found for this basename: LABPROT, INR,  in the last 72 hours   Recent Labs Lab 11/11/13 2334 11/12/13 1725 11/14/13 0500 11/15/13 0425  AST 22  --  53* 32  ALT 57*  --  111* 84*  ALKPHOS 54  --  42 41  BILITOT 0.6  --  0.6 0.6  PROT 8.9*  --  7.3 7.8  ALBUMIN 3.2* 2.9* 2.7* 2.7*     Lipase  No results found for this basename: lipase     Studies/Results: Dg Chest Port 1 View  11/15/2013   CLINICAL DATA:  Evaluate central venous catheter placement  EXAM: PORTABLE CHEST - 1 VIEW  COMPARISON:  DG CHEST 1V PORT dated 11/12/2013  FINDINGS: A right-sided central venous catheter  is appreciated with tip projecting in the region of the superior vena caval right atrial junction. There is no evidence of a pneumothorax. This is via a right subclavian approach. A right internal jugular catheter is appreciated with tip projecting in the regions superior vena cava. The lung volumes are low. No focal regions of consolidation or focal infiltrates. Cardiac silhouette is enlarged. A left-sided cardiac pacing unit is appreciated projecting within the apical periphery of chest wall. The tips project in the region the right atrium and right ventricle. The osseous structures unremarkable.  IMPRESSION: Right-sided central venous catheter with tip projecting in the region of the superior vena caval right atrial junction. This catheter has been placed in the interim. There is no evidence for pneumothorax. Otherwise no further change in the chest radiograph.   Electronically Signed   By: Margaree Mackintosh M.D.   On: 11/15/2013 18:44    Medications: . atorvastatin  40 mg Oral q1800  . carvedilol  6.25 mg Oral BID WC  . feeding supplement (GLUCERNA SHAKE)  237 mL Oral BID BM  . heparin  5,000 Units Subcutaneous Q8H  . insulin aspart  0-20 Units Subcutaneous Q4H  . magnesium sulfate 1 - 4 g bolus IVPB  2 g Intravenous Once  . OxyCODONE  20 mg Oral Q12H  . sodium chloride  10-40 mL Intracatheter Q12H  . sodium chloride  3 mL Intravenous Q12H    Assessment/Plan Sepsis/hypotension ARF on chronic stage III CKD, with hyperkalemia and metabolic acidosis. AODM ID (poor control) Infected prosthetic Gore-Tex mesh with a giant abdominal abscess status post IandD and Gore-Tex mesh removal 08/14/2013 , with entero-atmospheric fistula. Atrial Fibrillation/flutter with long term anticoagulation. Discharged on apixaban. Chronic systolic and diastolic CHF, with recent 2D showing EF 50-55% Morbid obesity BMI 46.7 08/12/13.  AAA 4.5 CM (CT scan 08/01/13)  Planning: -Evaluate with hernia guru at [facility]. Appt for 11February at 10 AM (most likely delay 1-2 weeks until out of hospital:  Dr Larey Dresser  [facility].  [address], [place]  [place], [place] [address]  [phone]  I have discussed with Wound care nurse and we will take out the suction with the next Eakin's change, which will be tomorrow.  The biggest issue with the Eakin's is the cost, at about $100 per bag.  I do not think he needs this and should not hold up SNF placement.          LOS: 5 days    Arseniy Toomey 11/16/2013

## 2013-11-17 DIAGNOSIS — K432 Incisional hernia without obstruction or gangrene: Secondary | ICD-10-CM

## 2013-11-17 DIAGNOSIS — E785 Hyperlipidemia, unspecified: Secondary | ICD-10-CM

## 2013-11-17 LAB — GLUCOSE, CAPILLARY
GLUCOSE-CAPILLARY: 182 mg/dL — AB (ref 70–99)
Glucose-Capillary: 152 mg/dL — ABNORMAL HIGH (ref 70–99)
Glucose-Capillary: 125 mg/dL — ABNORMAL HIGH (ref 70–99)
Glucose-Capillary: 172 mg/dL — ABNORMAL HIGH (ref 70–99)
Glucose-Capillary: 172 mg/dL — ABNORMAL HIGH (ref 70–99)

## 2013-11-17 LAB — CBC WITH DIFFERENTIAL/PLATELET
Basophils Absolute: 0 10*3/uL (ref 0.0–0.1)
Basophils Relative: 0 % (ref 0–1)
Eosinophils Absolute: 0.4 10*3/uL (ref 0.0–0.7)
Eosinophils Relative: 4 % (ref 0–5)
HCT: 35.1 % — ABNORMAL LOW (ref 39.0–52.0)
Hemoglobin: 11.7 g/dL — ABNORMAL LOW (ref 13.0–17.0)
LYMPHS PCT: 23 % (ref 12–46)
Lymphs Abs: 2.7 10*3/uL (ref 0.7–4.0)
MCH: 30.5 pg (ref 26.0–34.0)
MCHC: 33.3 g/dL (ref 30.0–36.0)
MCV: 91.4 fL (ref 78.0–100.0)
Monocytes Absolute: 1.1 10*3/uL — ABNORMAL HIGH (ref 0.1–1.0)
Monocytes Relative: 9 % (ref 3–12)
NEUTROS ABS: 7.4 10*3/uL (ref 1.7–7.7)
Neutrophils Relative %: 64 % (ref 43–77)
PLATELETS: 234 10*3/uL (ref 150–400)
RBC: 3.84 MIL/uL — ABNORMAL LOW (ref 4.22–5.81)
RDW: 14.2 % (ref 11.5–15.5)
WBC: 11.6 10*3/uL — AB (ref 4.0–10.5)

## 2013-11-17 LAB — COMPREHENSIVE METABOLIC PANEL
ALT: 52 U/L (ref 0–53)
AST: 22 U/L (ref 0–37)
Albumin: 2.7 g/dL — ABNORMAL LOW (ref 3.5–5.2)
Alkaline Phosphatase: 39 U/L (ref 39–117)
BILIRUBIN TOTAL: 0.5 mg/dL (ref 0.3–1.2)
BUN: 28 mg/dL — AB (ref 6–23)
CHLORIDE: 99 meq/L (ref 96–112)
CO2: 22 mEq/L (ref 19–32)
CREATININE: 1.23 mg/dL (ref 0.50–1.35)
Calcium: 9 mg/dL (ref 8.4–10.5)
GFR calc Af Amer: 72 mL/min — ABNORMAL LOW (ref >90)
GFR calc non Af Amer: 62 mL/min — ABNORMAL LOW (ref >90)
Glucose, Bld: 130 mg/dL — ABNORMAL HIGH (ref 70–99)
Potassium: 4.7 mEq/L (ref 3.7–5.3)
Sodium: 131 mEq/L — ABNORMAL LOW (ref 137–147)
TOTAL PROTEIN: 7.2 g/dL (ref 6.0–8.3)

## 2013-11-17 LAB — MAGNESIUM: MAGNESIUM: 1.6 mg/dL (ref 1.5–2.5)

## 2013-11-17 MED ORDER — INSULIN ASPART 100 UNIT/ML ~~LOC~~ SOLN
0.0000 [IU] | Freq: Three times a day (TID) | SUBCUTANEOUS | Status: DC
  Administered 2013-11-17 (×2): 4 [IU] via SUBCUTANEOUS
  Administered 2013-11-18 (×2): 7 [IU] via SUBCUTANEOUS

## 2013-11-17 MED ORDER — INSULIN GLARGINE 100 UNIT/ML ~~LOC~~ SOLN
5.0000 [IU] | Freq: Every day | SUBCUTANEOUS | Status: DC
  Administered 2013-11-17: 5 [IU] via SUBCUTANEOUS
  Filled 2013-11-17 (×2): qty 0.05

## 2013-11-17 MED ORDER — TRACE MINERALS CR-CU-F-FE-I-MN-MO-SE-ZN IV SOLN
INTRAVENOUS | Status: DC
  Administered 2013-11-17: 18:00:00 via INTRAVENOUS
  Filled 2013-11-17: qty 1000

## 2013-11-17 MED ORDER — INSULIN ASPART 100 UNIT/ML ~~LOC~~ SOLN: 0.0000 [IU] | Freq: Every day | SUBCUTANEOUS | Status: DC

## 2013-11-17 MED ORDER — FAT EMULSION 20 % IV EMUL
250.0000 mL | INTRAVENOUS | Status: DC
  Administered 2013-11-17: 250 mL via INTRAVENOUS
  Filled 2013-11-17: qty 250

## 2013-11-17 NOTE — Progress Notes (Signed)
CSW met with patient to provide bed offers. Patient is alert and oriented X3. Patient states that he is not going to a facility upon discharge, he is going home. CSW gave him bed offers anyway just in case he changes his mind. He asked CSW to put them on the chair in case his wife wanted to look at them. No further CSW needs at this time. If patient changes his mind, please reconsult.  Timothy Trudell C. [place] MSW, [place]

## 2013-11-17 NOTE — Evaluation (Signed)
Occupational Therapy Evaluation Patient Details Name: Kurt Cross MRN: [id number] DOB: [birth year] Today's Date: 11/17/2013 Time: [id number] OT Time Calculation (min): 21 min  OT Assessment / Plan / Recommendation History of present illness Kurt Cross is a 63 y.o. male has a history recent diagnosis of a very large abdominal wall abscess that required a significant surgical debridement and a prolonged wound healing course and that has left him on total parenteral nutrition, and debilitated at home at the patient's request instead of in a nursing facility. His wife states that he is on continuous TPN, over the last week he has had several blood draws which have shown a gradually rising potassium level and renal dysfunction though the wife does not know the exact numbers. She was told to bring him to the hospital tonight because of his elevated potassium which was reportedly over 7, he has been difficult to arouse over the last 2 days and hasn't been somnolent or lethargic. There has been no fevers, no vomiting, no coughing, no focal complaints though he has been generally weak and unable to get out of bed. She has been caring for his enterocutaneous fistula with ostomy bags which she has fastened in a very unique way because of the size of his wound. The patient is followed in the surgical clinic and is being closely watched by both his internist Dr. Maudie Mercury as well as his general surgeon Dr. Johney Maine.  Per wife plans had been made for definitive surgical management evaluation in [place] on the 11th of this month   Clinical Impression   Pt presents to OT with decreased I with ADL activity , but wife does A with all ADL activity per pt.  Being that wife A with all ADL activity do not feel skilled OT indicated. Pt agrees    OT Assessment  Patient does not need any further OT services          Equipment Recommendations  None recommended by OT          Precautions / Restrictions  Precautions Precautions: Fall       ADL  Transfers/Ambulation Related to ADLs: Pt reports wife gives pt a sponge bath and helps him with dressing.  Pt very unsafe with stand to sit despite VC. Pt has a lift chair at home in which helps pt with sit to stand. Wife not present for OT.  ADL Comments: Pt agreed to walk with PT and OT.    Pt overall mod- Max A with ADL activity including LB ADL's and toileting hygiene.  Pt assured OT wife would perform     OT Goals(Current goals can be found in the care plan section) Acute Rehab OT Goals Patient Stated Goal: i want to walk  Visit Information  Last OT Received On: 11/17/13 Reason Eval/Treat Not Completed: Medical issues which prohibited therapy History of Present Illness: Kurt Cross is a 63 y.o. male has a history recent diagnosis of a very large abdominal wall abscess that required a significant surgical debridement and a prolonged wound healing course and that has left him on total parenteral nutrition, and debilitated at home at the patient's request instead of in a nursing facility. His wife states that he is on continuous TPN, over the last week he has had several blood draws which have shown a gradually rising potassium level and renal dysfunction though the wife does not know the exact numbers. She was told to bring him to the hospital tonight because of his  elevated potassium which was reportedly over 7, he has been difficult to arouse over the last 2 days and hasn't been somnolent or lethargic. There has been no fevers, no vomiting, no coughing, no focal complaints though he has been generally weak and unable to get out of bed. She has been caring for his enterocutaneous fistula with ostomy bags which she has fastened in a very unique way because of the size of his wound. The patient is followed in the surgical clinic and is being closely watched by both his internist Dr. Maudie Mercury as well as his general surgeon Dr. Johney Maine.  Per wife plans had been  made for definitive surgical management evaluation in [place] on the 11th of this month       Prior [place] expects to be discharged to:: Private residence Living Arrangements: Spouse/significant other Available Help at Discharge: Family;Available 24 hours/day Type of Home: House Home Access: Ramped entrance Home Layout: One level Home Equipment: Walker - 2 wheels;Cane - single point;Wheelchair - manual;Hospital bed (lift chair) Prior Function Level of Independence: Needs assistance Gait / Transfers Assistance Needed: uses RW ADL's / Homemaking Assistance Needed: wife assisted with sponge bath, socks and pt used cane to "hook" pants around feet Communication Communication: No difficulties         Vision/Perception Vision - History Patient Visual Report: No change from baseline   Cognition  Cognition Arousal/Alertness: Awake/alert Behavior During Therapy: WFL for tasks assessed/performed Overall Cognitive Status: Within Functional Limits for tasks assessed Memory: Decreased recall of precautions    Extremity/Trunk Assessment Upper Extremity Assessment Upper Extremity Assessment: Generalized weakness Lower Extremity Assessment Lower Extremity Assessment: Generalized weakness Cervical / Trunk Assessment Cervical / Trunk Assessment: Normal     Mobility Transfers Overall transfer level: Needs assistance Equipment used: Rolling walker (2 wheeled) Transfers: Sit to/from Stand Sit to Stand: Mod assist;+2 safety/equipment General transfer comment: multimodal cues for safe sitting, pt does not warn therapist, does not reach for armrests, pt has deceased control of descnt. Pt needs support to stand from recliner.        Balance Balance Overall balance assessment: Needs assistance Standing balance support: Bilateral upper extremity supported Standing balance-Leahy Scale: Fair Standing balance comment: tends to lean posteriorly  initially   End of Session OT - End of Session Activity Tolerance: Patient tolerated treatment well Patient left: in chair;with call bell/phone within reach Nurse Communication: Mobility status  GO     Betsy Pries 11/17/2013, 2:22 PM

## 2013-11-17 NOTE — Consult Note (Signed)
WOC wound follow up Wound type:Midline abdominal wound with stomatized fistula.  Pouch began leaking last night.  He asked nursing not to change, but to reinforce until I arrived. Wear time was 4 days. Measurement:unchanged from Monday  Stomatized fistual measures 1 and 1/4 inches round and telescopes out with any increase in intraabdominal pressure.  Stool is now thick and brown.  Pancaking of stool is pushing pouch away. Wound bed:95% red, moist. 5% yellow adherent slough between 6-7 o'clock. Drainage (amount, consistency, odor) As described above.  May require cutting of tail closure and application of an ostomy pouch clamp to accommodate thickened stool removal. Periwound:intact. Dressing procedure/placement/frequency:Every 4 days and PRN. Two pouches provided to bedside for patient's use, also 6 skin barrier rings and a can of adhesive releaser.  Skin barrier rings are used to make a "border" around the wound to which the large Eakin pouch can adhere. [facility] nursing team will continue to follow, and will remain available to this patient, the nursing, surgical and medical team.   Thanks, Maudie Flakes, MSN, RN, [place], [place], [place] [phone])

## 2013-11-17 NOTE — Progress Notes (Signed)
OT Cancellation Note  Patient Details Name: Kurt Cross MRN: [id number] DOB: May 26, 1951   Cancelled Treatment:    Reason Eval/Treat Not Completed: Medical issues which prohibited therapy. Pt stated his bag had spillled and he was not able to get up at this time.  Therapy checked on this pt multiple times yesterday also.  Will reattempt later in the day as schedule allows  [place], Thereasa Parkin 11/17/2013, 11:53 AM

## 2013-11-17 NOTE — Progress Notes (Addendum)
Pouch leaking overnight.  New pouch placed with assistance of WOCN.  Eakins pouch placed with donut adhesive.  Wound cleaned of thick, formed stool, difficult to empty from pouch.  Surface red and granulated, no erythema or irritation in area surrounding wound.  Suction stopped per MD order.  Patient told to call if he feels stooling in order to keep bag emptied appropriately.  Will continue to monitor.

## 2013-11-17 NOTE — Progress Notes (Signed)
Calorie Count Note  48 hour calorie count ordered.  Diet: Dys3 Supplements: Glucerna BID  Breakfast: 397 kcal, 21 gram protein Lunch: 220 kcal, 10 gram protein (glucerna) Dinner: 610 kcal, 29 gram protein Supplements: N/A-drank supplement at lunch  Total intake: 1227 kcal (52% of minimum estimated needs)  60 gram protein (38% of minimum estimated needs)  Estimated needs:  Kcal: 2370-2650  Protein: 160-180g  Fluid: >2.6L/day   -Pt's TPN has been decreased to 40 ml/hr to provide approximately 30% of est nutrition needs per pharmacy note. Pt did express that he feels hungrier and is enjoying solid foods. Denied any nausea/vomiting. Has been consuming Glucerna for lunch as he had a late breakfast -Wife had several questions concerning supplements upon d/c. Family member is on Belk at home, which provides 58 kcal/serving, and amino acids that promote tissue healing. Informed wife to continue with Glucerna supplement as this contains complete protein and higher kcal; however, addition of Juven would also be appropriate for healing of abd wound  Nutrition Dx: Inadequate oral intake related to inability to eat as evidenced by NPO- now r/t to PO intake < 75%   Goal: PO and TPN to meet >/= 90% of their estimated nutrition needs    Intervention: -Encouraged PO intake -Discussed supplement options/alternatives for pt upon d/c -Continue with Glucerna BID -Continue to wean TPN per pharmacy as pt's appetite is improving   Atlee Abide [place] Clinical Dietitian [phone]

## 2013-11-17 NOTE — Progress Notes (Addendum)
PARENTERAL NUTRITION CONSULT NOTE - Follow up  Pharmacy Consult for TPN Indication: Enterocutaneous fistula  No Known Allergies  Patient Measurements: Height: 5\' 11"  (180.3 cm) Weight: 368 lb 2.7 oz (167 kg) IBW/kg (Calculated) : 75.3 Adjusted Body Weight: 112kg  Vital Signs: Temp: 97.6 F (36.4 C) (02/11 0502) Temp src: Oral (02/11 0502) BP: 140/69 mmHg (02/11 0502) Pulse Rate: 59 (02/11 0502) Intake/Output from previous day: 02/10 0701 - 02/11 0700 In: 1800 [P.O.:800; I.V.:640; TPN:40] Out: 1500 [Urine:1050; Drains:450]  Labs:  Recent Labs  11/15/13 0425  WBC 10.8*  HGB 11.5*  HCT 34.9*  PLT 258     Recent Labs  11/15/13 0425 11/16/13 0340  NA 134* 132*  K 4.8 4.6  CL 100 99  CO2 24 22  GLUCOSE 189* 184*  BUN 40* 33*  CREATININE 1.56* 1.37*  CALCIUM 9.4 9.1  MG 1.8 1.5  PHOS 3.1 3.4  PROT 7.8  --   ALBUMIN 2.7*  --   AST 32  --   ALT 84*  --   ALKPHOS 41  --   BILITOT 0.6  --   PREALBUMIN 21.9  --   TRIG 82  --    Estimated Creatinine Clearance: 90.8 ml/min (by C-G formula based on Cr of 1.37).    Recent Labs  11/16/13 2344 11/17/13 0446 11/17/13 0731  GLUCAP 168* 152* 125*    Insulin Requirements in the past 24 hours:  CBGs range 125 - 186 (improved) 34 units of moderate SSI q4h. 45 units/ L TNA  PTA insulin included:  NPH 40 units BID, Novolog 12 units BID, Regular insulin in TPN 60 units/3L/24 hrs.  Nutritional Goals:  Previous goals per RD: [id number] kcal/day, 160-180 grams/day protein, 3.3-3.5 L/day fluid.  Clinimix E 5/15 at goal rate 125 ml/hr (3L/day) with 20% lipids at 10 ml/hr to provide 150g protein and 2610 kcal daily. Due to TNA bag capacity, maximum volume is 3L per 24 hrs. This rate will meet >/= 90% of estimated needs.  Current Nutrition:  Diet: Dysphagia 3 diet 2/10 - Glucerna shakes BID IVF:  NS @ 40 ml/hr. TNA: Clinimix 5/15 @ 40 ml/hr, Lipids @ 5 ml/hr - wean started 2/10  Assessment:  63yo M known to  pharmacy from recent admission. Readmitted 2/5 with hyperkalemia, generalized weakness, suspected PICC site infection. On TNA at home per [facility] d/t abdominal abscess and EC fistula. TNA and PICC were removed on admission.  Central access with right IJ was placed 2/6. Pharmacy is asked to resume TNA on 2/7.  Calorie count in progress, started 2/9. Current TNA at 40 ml/hr providing ~30% total nutritional needs.  Labs:  Glucose: Hx DM on insulin with previous TPN. CBGs finally improving and trending down quickly with increased amount in TNA, SSI and removal of dextrose from IVF.  Goal 90-180.  Electrolytes: Na remains slightly low. K, Phos, Mag, and others WNL - NO electrolytes in current TNA Renal: BUN/SCr elevated but improving daily LFTs: AST and ALT elevated on 2/8 but improved 2/9.  TGs: wnl - 67 (1/5), 82 (2/9) Prealbumin: 18.1(1/5), 21.3 (2/8), 21.9 (2/9)    Plan:   TNA was weaned to 81ml/hr and Lipids to 33ml/hr yesterday. Since patient is tolerating diet, seems appropriate that TNA can be dc'd, however, must have an MD order to do this (can just dc consult entry).  If not dc'd today, will continue same rate and formula but decrease insulin to 35 units/L to prevent further CBG drop. Adjust CBGs and  SSI to ACHS now that eating consistently.   Peggyann Juba, PharmD, BCPS Pager: [phone]  11/17/2013 7:55 AM

## 2013-11-17 NOTE — Discharge Summary (Addendum)
Physician Discharge Summary  Kurt Cross [id number] DOB: 09-19-1951 DOA: 11/11/2013  PCP: [name], MD  Admit date: 11/11/2013 Discharge date: 11/17/2013  Time spent: 40 minutes  Recommendations for Outpatient Follow-up:   sepsis syndrome/hypotension  -Resolved, patient continues to have a mild leukocytosis but most likely reactive given negative fever, negative bands negative left shift; however given patient's complicated history rule continue patient on Augmentin for 7 days. -Stable for discharge  -Followup with PCP in one week for CMP, CBC with differential  ARF with Hyperkalemia and metab acidosis  -Resolved -PCP to Follow and recommend resuming Lasix/lisinopril when creatinine normalized -PCP to restart potassium if required     Diabetes mellitus, insulin dependent (IDDM), uncontrolled  -Will restart patient's home insulin regimen which is unconventional. -PCP to monitor glucose levels, hemoglobin A1c and make adjustments   Complicated abd wall abscess /Infected prosthetic mesh s/p I&D and removal 08/21/2013  -Appreciate surgery asssistance >>patient is scheduled to follow up in [place] for further eval and recs  -2/12 spoke with nutritionist and patient will be able to maintain proper nutrition without home TNA (request pharmacy to stop TNA) -Eakins pouch suction turned off per surgery. Consult home health, RN, for home care  -Remove PICC line prior to discharge today  -CM/SW working on discharge options/planning; NOTE SNF recommended but patient refused  -Mild leukocytosis most likely reactive  chronic systolic CHF, status post AICD  -Continue monitor fluid status closely off diuretics d/t ARF, compensated at this time  -Continue Coreg  -Creatinine continuing to improve. PCP to restart Lasix and adjust dose to maintain patient at dry weight -Patient to weigh himself today when he walks in the door at his home. This will be his dry weight.  OSA -CPAP per respiratory    Afib/Flutter  -In NSR -rate controlled, continue coreg    Discharge Diagnoses:  Principal Problem:   AKI (acute kidney injury) Active Problems:   Diabetes mellitus, insulin dependent (IDDM), uncontrolled   Obesity, Class III, BMI 40-49.9 (morbid obesity)   OBSTRUCTIVE SLEEP APNEA   Infected prosthetic mesh of abdominal wall with GIANT abscess s/p removal 08/14/2013   Jejunal SB fistula into abdominal wound   Atrial fibrillation/flutter   Short gut syndrome due to distal jejunal SB fistula   Hyperkalemia   Severe sepsis with acute organ dysfunction   Discharge Condition: Stable  Diet recommendation: Carb modified   Filed Weights   11/12/13 0500 11/12/13 0659  Weight: 188 kg (414 lb 7.4 oz) 167 kg (368 lb 2.7 oz)    History of present illness:  Kurt Cross is a 63 y.o BM PMHx noncompliance, OSA, jejunal SB fistula into abdominal, infected prosthetic mesh of abdominal wall with giant abscess  S/P removal 08/14/2013 diabetes Type 2 uncontrolled, atrial fibrillation/flutter, AKI. Recent diagnosis of a very large abdominal wall abscess that required a significant surgical debridement and a prolonged wound healing course and that has left him on total parenteral nutrition, and debilitated at home at the patient's request instead of in a nursing facility. His wife states that he is on continuous TPN, over the last week he has had several blood draws which have shown a gradually rising potassium level and renal dysfunction though the wife does not know the exact numbers. She was told to bring him to the hospital tonight because of his elevated potassium which was reportedly over 7, he has been difficult to arouse over the last 2 days and hasn't been somnolent or lethargic. There has been  no fevers, no vomiting, no coughing, no focal complaints though he has been generally weak and unable to get out of bed. She has been caring for his enterocutaneous fistula with ostomy bags which she has  fastened in a very unique way because of the size of his wound. The patient is followed in the surgical clinic and is being closely watched by both his internist Dr. Maudie Mercury as well as his general surgeon Dr. Johney Maine. Per wife plans had been made for definitive surgical management evaluation in [place] on the 25th of February. In the ED, potassium was initially 6.8. He was initially hypotensive in the 80s, BP slightly improved with IVF. Wife DOES report decreased ostomy output and worsening abdominal distention. Patient also reports 1 week history of pain at his PICC line site such that he cannot lie with his arm at his side. 2/11 patient states doing much better feels that he is ready to be discharged and refuses recommended SNF. 2/12 consult with nutritionist who feels that TNA can be stopped, and that patient should be able to maintain adequate nutrition by mouth.    Hospital Course:    Procedures:  PICC line placement on 2/9 >> stopped 2/12   Consultations: Dr. Remo Lipps gross (surgery)   Antibiotics Vanc started on 2/5 >> 2/9 Zosyn started on 2/5>> 2/6  Augmentin 2/12>>   Discharge Exam: Filed Vitals:   11/16/13 1423 11/16/13 1952 11/17/13 0502 11/17/13 1419  BP: 148/62 143/75 140/69 126/78  Pulse: 60 59 59 73  Temp: 98.1 F (36.7 C) 98.4 F (36.9 C) 97.6 F (36.4 C) 97.4 F (36.3 C)  TempSrc: Oral Oral Oral Oral  Resp: 18 24 20 20   Height:      Weight:      SpO2: 99% 100% 99% 97%    General: A./O. x4, NAD, Cardiovascular: Regular Larrey, negative murmurs rubs gallops, Respiratory: Clear to auscultation Abdomen; nontender, large incision beefy red, appears to be healing well. Ostomy bag present, hypoactive bowel  Discharge Instructions     Medication List    STOP taking these medications       furosemide 80 MG tablet  Commonly known as:  LASIX      TAKE these medications       ADULT TPN  - Inject into the vein continuous. Patient starts at 7pm-7am each night.  Gets TPN at [place]  - TPN with lipids 3049ml   - Additives   - 17ml infuvite  - 60 units humulin R  - Infuse at 159ml/hr     carvedilol 6.25 MG tablet  Commonly known as:  COREG  Take 6.25 mg by mouth 2 (two) times daily with a meal.     HUMULIN R 100 units/mL injection  Generic drug:  insulin regular  Inject 60 Units into the vein See admin instructions. Takes in his TPN at 2045     insulin aspart 100 UNIT/ML injection  Commonly known as:  novoLOG  Inject 12 Units into the skin 2 (two) times daily.     insulin NPH Human 100 UNIT/ML injection  Commonly known as:  HUMULIN N,NOVOLIN N  Inject 40 Units into the skin 2 (two) times daily before a meal.     lisinopril 10 MG tablet  Commonly known as:  PRINIVIL,ZESTRIL  Take 10 mg by mouth 2 (two) times daily.     loperamide 2 MG capsule  Commonly known as:  IMODIUM  Take 1 capsule (2 mg total) by mouth 2 (two)  times daily.     OxyCODONE 20 mg T12a 12 hr tablet  Commonly known as:  OXYCONTIN  Take 1 tablet (20 mg total) by mouth every 12 (twelve) hours.     oxyCODONE-acetaminophen 10-325 MG per tablet  Commonly known as:  PERCOCET  Take 1 tablet by mouth every 6 (six) hours as needed for pain.     potassium chloride SA 20 MEQ tablet  Commonly known as:  K-DUR,KLOR-CON  Take 2 tablets (40 mEq total) by mouth daily.     rosuvastatin 20 MG tablet  Commonly known as:  CRESTOR  Take 20 mg by mouth every other day.     spironolactone 100 MG tablet  Commonly known as:  ALDACTONE  Take 100 mg by mouth daily.       No Known Allergies     Follow-up Information   Follow up with Sherilyn Banker, MD. Schedule an appointment as soon as possible for a visit on 12/01/2013. (Appt rescheduled to 12/01/2013.  Please call [place] to reconform)    Specialty:  Surgery   Contact information:   [address] [place] [address] [phone]       Follow up with [name]., MD. (As needed)     Specialty:  General Surgery   Contact information:   [place] [address] [phone]       Follow up with Cena Benton, [place]. (to help troubleshoot large Eakins pouch controlling the wound/jejunal SB fistula)       Follow up with [name], MD. Schedule an appointment as soon as possible for a visit in 1 week. (to follow IV TNA/nutrition)    Specialty:  Internal Medicine   Contact information:   [address] [place] [address] [phone]        The results of significant diagnostics from this hospitalization (including imaging, microbiology, ancillary and laboratory) are listed below for reference.    Significant Diagnostic Studies: Ct Abdomen Pelvis Wo Contrast  11/12/2013   CLINICAL DATA:  Evaluate for recurrence of abscess  EXAM: CT ABDOMEN AND PELVIS WITHOUT CONTRAST  TECHNIQUE: Multidetector CT imaging of the abdomen and pelvis was performed following the standard protocol without intravenous contrast.  COMPARISON:  08/14/2013  FINDINGS: BODY WALL: There is an ostomy in the right lower quadrant. Ostomy defect is relatively large in the anterior abdominal wall, status post mesh removal and abscess drainage. There is no fluid collection currently. 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 Contrast does not reach these segments of bowel to confirm the persistence of fistula. There is a distinct left lower quadrant ventral wall hernia which contains a knuckle of nonobstructed sigmoid colon. Bilateral fatty inguinal hernias.  LOWER CHEST: Unremarkable.  ABDOMEN/PELVIS:  Liver: No focal abnormality.  Biliary: Cholelithiasis. No gallbladder wall thickening or pericholecystic edema.  Pancreas: Unremarkable.  Spleen: Unremarkable.  Adrenals: Large, lobulated adrenal glands, likely from multiple adenomas.  Kidneys and ureters: No  hydronephrosis or stone.  Bladder: Contains urine.  Reproductive: Unremarkable.  Bowel: No bowel obstruction.  Stoma and neighboring findings above.  Retroperitoneum: No mass or adenopathy.  Peritoneum: No free fluid or gas.  Vascular: No acute abnormality.  OSSEOUS: No acute abnormalities. Stable appearance of the degenerated symphysis pubis and SI joints. Bulky spondylosis in the lower thoracic spine.  IMPRESSION: 1. No recurrence of previously seen abdominal  wall hernia. No intra-abdominal source of sepsis identified. 2. No hydronephrosis to explain renal failure. 3. Cholelithiasis.   Electronically Signed   By: Jorje Guild M.D.   On: 11/12/2013 04:32   Dg Chest Port 1 View  11/15/2013   CLINICAL DATA:  Evaluate central venous catheter placement  EXAM: PORTABLE CHEST - 1 VIEW  COMPARISON:  DG CHEST 1V PORT dated 11/12/2013  FINDINGS: A right-sided central venous catheter is appreciated with tip projecting in the region of the superior vena caval right atrial junction. There is no evidence of a pneumothorax. This is via a right subclavian approach. A right internal jugular catheter is appreciated with tip projecting in the regions superior vena cava. The lung volumes are low. No focal regions of consolidation or focal infiltrates. Cardiac silhouette is enlarged. A left-sided cardiac pacing unit is appreciated projecting within the apical periphery of chest wall. The tips project in the region the right atrium and right ventricle. The osseous structures unremarkable.  IMPRESSION: Right-sided central venous catheter with tip projecting in the region of the superior vena caval right atrial junction. This catheter has been placed in the interim. There is no evidence for pneumothorax. Otherwise no further change in the chest radiograph.   Electronically Signed   By: Margaree Mackintosh M.D.   On: 11/15/2013 18:44   Dg Chest Port 1 View  11/12/2013   CLINICAL DATA:  Status post central line placement  EXAM: PORTABLE  CHEST - 1 VIEW  COMPARISON:  11/12/2013  FINDINGS: Cardiac shadow is stable. A pacing device is again seen. A new right-sided central venous line is seen with the catheter tip in the mid superior vena cava. No pneumothorax is noted. The previously seen right-sided PICC line is been removed in the interval. The lungs remain clear.  IMPRESSION: Status post central line placement as described. No pneumothorax is noted.   Electronically Signed   By: Inez Catalina M.D.   On: 11/12/2013 12:37   Dg Chest Port 1 View  11/12/2013   CLINICAL DATA:  Weakness and body aches.  EXAM: PORTABLE CHEST - 1 VIEW  COMPARISON:  Single view of the chest 10/15/2013.  FINDINGS: Three-view pacing device in place. Right PICC is again seen. Lungs are clear. Heart size is mildly enlarged. No pneumothorax or pleural effusion.  IMPRESSION: No acute disease.   Electronically Signed   By: Inge Rise M.D.   On: 11/12/2013 00:10    Microbiology: Recent Results (from the past 240 hour(s))  CULTURE, BLOOD (ROUTINE X 2)     Status: None   Collection Time    11/11/13 11:37 PM      Result Value Ref Range Status   Specimen Description BLOOD RIGHT BICEP   Final   Special Requests BOTTLES DRAWN AEROBIC AND ANAEROBIC 5CC   Final   Culture  Setup Time     Final   Value: 11/12/2013 03:29     Performed at [facility]   Culture     Final   Value:        BLOOD CULTURE RECEIVED NO GROWTH TO DATE CULTURE WILL BE HELD FOR 5 DAYS BEFORE ISSUING A FINAL NEGATIVE REPORT     Performed at [facility]   Report Status PENDING   Incomplete  CULTURE, BLOOD (ROUTINE X 2)     Status: None   Collection Time    11/11/13 11:37 PM      Result Value Ref Range Status   Specimen Description BLOOD  LEFT HAND   Final   Special Requests BOTTLES DRAWN AEROBIC AND ANAEROBIC 4CC   Final   Culture  Setup Time     Final   Value: 11/12/2013 03:28     Performed at [facility]   Culture     Final   Value:        BLOOD CULTURE RECEIVED  NO GROWTH TO DATE CULTURE WILL BE HELD FOR 5 DAYS BEFORE ISSUING A FINAL NEGATIVE REPORT     Performed at [facility]   Report Status PENDING   Incomplete  CATH TIP CULTURE     Status: None   Collection Time    11/12/13  2:53 AM      Result Value Ref Range Status   Specimen Description CATH TIP   Final   Special Requests NONE   Final   Culture     Final   Value: NO GROWTH 2 DAYS     Performed at [facility]   Report Status 11/14/2013 FINAL   Final  MRSA PCR SCREENING     Status: None   Collection Time    11/12/13  5:34 AM      Result Value Ref Range Status   MRSA by PCR NEGATIVE  NEGATIVE Final   Comment:            The GeneXpert MRSA Assay (FDA     approved for NASAL specimens     only), is one component of a     comprehensive MRSA colonization     surveillance program. It is not     intended to diagnose MRSA     infection nor to guide or     monitor treatment for     MRSA infections.  URINE CULTURE     Status: None   Collection Time    11/13/13  6:30 AM      Result Value Ref Range Status   Specimen Description URINE, CATHETERIZED   Final   Special Requests NONE   Final   Culture  Setup Time     Final   Value: 11/13/2013 14:03     Performed at [facility]     Final   Value: NO GROWTH     Performed at [facility]   Culture     Final   Value: NO GROWTH     Performed at [facility]   Report Status 11/14/2013 FINAL   Final     Labs: Basic Metabolic Panel:  Recent Labs Lab 11/12/13 1725  11/13/13 0620  11/14/13 0205 11/14/13 0500 11/15/13 0425 11/16/13 0340 11/17/13 0810  NA 130*  129*  < > 131*  < > 132* 136* 134* 132* 131*  K 6.5*  6.4*  < > 6.0*  < > 5.1 5.3 4.8 4.6 4.7  CL 99  98  < > 98  < > 98 102 100 99 99  CO2 17*  17*  < > 22  < > 23 25 24 22 22   GLUCOSE 220*  221*  < > 203*  < > 239* 232* 189* 184* 130*  BUN 113*  113*  < > 92*  < > 66* 62* 40* 33* 28*  CREATININE 2.78*  2.77*  < >  2.54*  < > 1.91* 1.91* 1.56* 1.37* 1.23  CALCIUM 9.9  9.8  < > 9.9  < > 9.0 9.3 9.4 9.1 9.0  MG  --   --  2.2  --   --  2.0 1.8 1.5 1.6  PHOS 5.6*  --  5.1*  --   --  3.9 3.1 3.4  --   < > = values in this interval not displayed. Liver Function Tests:  Recent Labs Lab 11/11/13 2334 11/12/13 1725 11/14/13 0500 11/15/13 0425 11/17/13 0810  AST 22  --  53* 32 22  ALT 57*  --  111* 84* 52  ALKPHOS 54  --  42 41 39  BILITOT 0.6  --  0.6 0.6 0.5  PROT 8.9*  --  7.3 7.8 7.2  ALBUMIN 3.2* 2.9* 2.7* 2.7* 2.7*   No results found for this basename: LIPASE, AMYLASE,  in the last 168 hours No results found for this basename: AMMONIA,  in the last 168 hours CBC:  Recent Labs Lab 11/11/13 2334  11/12/13 0149 11/13/13 0620 11/14/13 0500 11/15/13 0425 11/17/13 0810  WBC 16.1*  --   --  12.3* 10.1 10.8* 11.6*  NEUTROABS 11.8*  --   --   --  6.5 6.8 7.4  HGB 13.4  < > 13.3 11.7* 11.2* 11.5* 11.7*  HCT 39.5  < > 39.0 35.0* 34.0* 34.9* 35.1*  MCV 90.2  --   --  90.0 91.2 91.6 91.4  PLT 277  --   --  259 238 258 234  < > = values in this interval not displayed. Cardiac Enzymes: No results found for this basename: CKTOTAL, CKMB, CKMBINDEX, TROPONINI,  in the last 168 hours BNP: BNP (last 3 results)  Recent Labs  08/01/13 1649  PROBNP 481.4*   CBG:  Recent Labs Lab 11/16/13 2344 11/17/13 0446 11/17/13 0731 11/17/13 1223 11/17/13 1640  GLUCAP 168* 152* 125* 182* 172*       Signed:  Dia Crawford, MD [facility] [phone] pager

## 2013-11-17 NOTE — Progress Notes (Signed)
[place] [id number] Apr 30, 1951  CARE TEAM:  PCP: [name], MD  Outpatient Care Team: Patient Care Team: [name], MD as PCP - General (Internal Medicine) [name], MD as Consulting Physician (Cardiology) [name], MD as Consulting Physician (Pulmonary Disease) [name], MD as Consulting Physician (General Surgery) Sherilyn Banker as Consulting Physician (Surgery)  Inpatient Treatment Team: Treatment Team: Attending Provider: Allie Bossier, MD; Rounding Team: Redmond Baseman, MD; Dietitian: Christie Beckers, RD; Consulting Physician: Nolon Nations, MD; Registered Nurse: Ricky Ala, RN; Registered Nurse: Patton Salles, RN; Consulting Physician: [name], MD; Physical Therapist: Claretha Cooper, PT; Physician Assistant: Clinton Gallant, NT; Technician: Mertha Baars, NT; Occupational Therapist: Betsy Pries, OT   Subjective:  Stays in bed Waiting for new pouch - current one leaking a little Tol mech soft diet  ~60% needs Calorie counts continue  Objective:  Vital signs:  Filed Vitals:   11/16/13 0423 11/16/13 1423 11/16/13 1952 11/17/13 0502  BP: 137/60 148/62 143/75 140/69  Pulse: 59 60 59 59  Temp: 97.5 F (36.4 C) 98.1 F (36.7 C) 98.4 F (36.9 C) 97.6 F (36.4 C)  TempSrc: Oral Oral Oral Oral  Resp: _0 Height:      Weight:      SpO2: 99% 99% 100% 99%    Last BM Date: 11/15/13 (jejunostomy enterocutaneous fistula)  Intake/Output   Yesterday:  02/10 0701 - 02/11 0700 In: 1800 [P.O.:800; I.V.:640; TPN:40] Out: 1500 [Urine:1050; Drains:450] This shift:     Bowel function:  Flatus: y  BM: y - 400 thick brown effluent caught in [employer] bag    Physical Exam:  General: Pt awake/alert/oriented x4 in no acute distress Eyes: PERRL, normal EOM.  Sclera clear.  No icterus Neuro: CN II-XII intact w/o focal sensory/motor deficits. Lymph: No head/neck/groin lymphadenopathy Psych:  No  delerium/psychosis/paranoia HENT: Normocephalic, Mucus membranes moist.  No thrush.   Neck: Supple, No tracheal deviation Chest: No chest wall pain w good excursion CV:  Pulses intact.  Regular rhythm MS: Normal AROM mjr joints.  No obvious deformity.  RUE PICC in place Abdomen: Soft.  Obese.  Nondistended.  Nontender.  No evidence of peritonitis.  No incarcerated hernias.  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  Less undermining.    Ext:  SCDs BLE.  No mjr edema.  No cyanosis Skin: No petechiae / purpura   Problem List:   Principal Problem:   AKI (acute kidney injury) Active Problems:   Jejunal SB fistula into abdominal wound   Short gut syndrome due to distal jejunal SB fistula   Obesity, Class III, BMI 40-49.9 (morbid obesity)   Infected prosthetic mesh of abdominal wall with GIANT abscess s/p removal 08/14/2013   Diabetes mellitus, insulin dependent (IDDM), uncontrolled   OBSTRUCTIVE SLEEP APNEA   Atrial fibrillation/flutter   Hyperkalemia   Severe sepsis with acute organ dysfunction   Assessment  [place] A Dusenbury  63 y.o. male       Improving  Plan:  -Large Eakin's pouch to control EC fistula into contracting wound.  I am doubtful that the suction is working well & lets him stay bedridden - drain ostomy pouch q6h & PRN.  ENFORCE REGULAR EMPTYING  -WOCN/ostomy care as outpatient - [facility] did not go well & they did not seem too compliant/successful w outpt WOCN w [employer].  See what ideas inpt WOCN have.  -adv  diet & follow.  Cal counts.  WEAN TNA as needed - eating >50% = 1/2 TNA and see if can get off all together.  This is the main reason pt readmitted - not the complicated EC fistula wound.  Outpt TNA per medicine.  I reviewed CT scan with medicine.  Fistula more likely distal jejunum/ prox ileum.  Therefore may be able to eventually get off TNA.  Pt notes he was eating solid food OK  -DM control - ?inc Humulin? - defer to  primary med service.  -VTE prophylaxis- SCDs, etc  -mobilize as tolerated to help recovery.  PT/OT evals - ?SNF since bounced back from home.  GET RID OF THE AIR MATTRESS BED FOR NOW.  He continues to just stay in bed & PT/OT have yet to do much with him.  GET HIM UP!!!!  -Evaluate with hernia guru at [facility]. Appt moved out to 25February:   Dr Perley Jain [facility].  [address] [place]  [place], [place] [address] [phone]   [name], M.D., F.A.C.S. Gastrointestinal and Minimally Invasive Surgery [facility] [address] [place], [place] [address] [phone] Main / Paging   11/17/2013   Results:   Labs: Results for orders placed during the hospital encounter of 11/11/13 (from the past 48 hour(s))  GLUCOSE, CAPILLARY     Status: Abnormal   Collection Time    11/15/13 12:03 PM      Result Value Ref Range   Glucose-Capillary 209 (*) 70 - 99 mg/dL  GLUCOSE, CAPILLARY     Status: Abnormal   Collection Time    11/15/13  5:09 PM      Result Value Ref Range   Glucose-Capillary 228 (*) 70 - 99 mg/dL  GLUCOSE, CAPILLARY     Status: Abnormal   Collection Time    11/15/13  8:07 PM      Result Value Ref Range   Glucose-Capillary 267 (*) 70 - 99 mg/dL  GLUCOSE, CAPILLARY     Status: Abnormal   Collection Time    11/16/13 12:08 AM      Result Value Ref Range   Glucose-Capillary 185 (*) 70 - 99 mg/dL  BASIC METABOLIC PANEL     Status: Abnormal   Collection Time    11/16/13  3:40 AM      Result Value Ref Range   Sodium 132 (*) 137 - 147 mEq/L   Potassium 4.6  3.7 - 5.3 mEq/L   Chloride 99  96 - 112 mEq/L   CO2 22  19 - 32 mEq/L   Glucose, Bld 184 (*) 70 - 99 mg/dL   BUN 33 (*) 6 - 23 mg/dL   Creatinine, Ser 1.37 (*) 0.50 - 1.35 mg/dL   Calcium 9.1  8.4 - 10.5 mg/dL   GFR calc non Af Amer 55 (*) >90 mL/min   GFR calc Af Amer 63 (*) >90 mL/min   Comment: (NOTE)     The  eGFR has been calculated using the CKD EPI equation.     This calculation has not been validated in all clinical situations.     eGFR's persistently <90 mL/min signify possible Chronic Kidney     Disease.  MAGNESIUM     Status: None   Collection Time    11/16/13  3:40 AM      Result Value Ref Range   Magnesium 1.5  1.5 - 2.5 mg/dL  PHOSPHORUS  Status: None   Collection Time    11/16/13  3:40 AM      Result Value Ref Range   Phosphorus 3.4  2.3 - 4.6 mg/dL  GLUCOSE, CAPILLARY     Status: Abnormal   Collection Time    11/16/13  4:25 AM      Result Value Ref Range   Glucose-Capillary 175 (*) 70 - 99 mg/dL  GLUCOSE, CAPILLARY     Status: Abnormal   Collection Time    11/16/13  7:26 AM      Result Value Ref Range   Glucose-Capillary 155 (*) 70 - 99 mg/dL  GLUCOSE, CAPILLARY     Status: Abnormal   Collection Time    11/16/13 12:02 PM      Result Value Ref Range   Glucose-Capillary 186 (*) 70 - 99 mg/dL  GLUCOSE, CAPILLARY     Status: Abnormal   Collection Time    11/16/13  5:25 PM      Result Value Ref Range   Glucose-Capillary 222 (*) 70 - 99 mg/dL  GLUCOSE, CAPILLARY     Status: Abnormal   Collection Time    11/16/13  7:54 PM      Result Value Ref Range   Glucose-Capillary 210 (*) 70 - 99 mg/dL  GLUCOSE, CAPILLARY     Status: Abnormal   Collection Time    11/16/13 11:44 PM      Result Value Ref Range   Glucose-Capillary 168 (*) 70 - 99 mg/dL  GLUCOSE, CAPILLARY     Status: Abnormal   Collection Time    11/17/13  4:46 AM      Result Value Ref Range   Glucose-Capillary 152 (*) 70 - 99 mg/dL  GLUCOSE, CAPILLARY     Status: Abnormal   Collection Time    11/17/13  7:31 AM      Result Value Ref Range   Glucose-Capillary 125 (*) 70 - 99 mg/dL  COMPREHENSIVE METABOLIC PANEL     Status: Abnormal   Collection Time    11/17/13  8:10 AM      Result Value Ref Range   Sodium 131 (*) 137 - 147 mEq/L   Potassium 4.7  3.7 - 5.3 mEq/L   Chloride 99  96 - 112 mEq/L   CO2 22   19 - 32 mEq/L   Glucose, Bld 130 (*) 70 - 99 mg/dL   BUN 28 (*) 6 - 23 mg/dL   Creatinine, Ser 1.23  0.50 - 1.35 mg/dL   Calcium 9.0  8.4 - 10.5 mg/dL   Total Protein 7.2  6.0 - 8.3 g/dL   Albumin 2.7 (*) 3.5 - 5.2 g/dL   AST 22  0 - 37 U/L   ALT 52  0 - 53 U/L   Alkaline Phosphatase 39  39 - 117 U/L   Total Bilirubin 0.5  0.3 - 1.2 mg/dL   GFR calc non Af Amer 62 (*) >90 mL/min   GFR calc Af Amer 72 (*) >90 mL/min   Comment: (NOTE)     The eGFR has been calculated using the CKD EPI equation.     This calculation has not been validated in all clinical situations.     eGFR's persistently <90 mL/min signify possible Chronic Kidney     Disease.  CBC WITH DIFFERENTIAL     Status: Abnormal   Collection Time    11/17/13  8:10 AM      Result Value Ref Range   WBC 11.6 (*)  4.0 - 10.5 K/uL   RBC 3.84 (*) 4.22 - 5.81 MIL/uL   Hemoglobin 11.7 (*) 13.0 - 17.0 g/dL   HCT 35.1 (*) 39.0 - 52.0 %   MCV 91.4  78.0 - 100.0 fL   MCH 30.5  26.0 - 34.0 pg   MCHC 33.3  30.0 - 36.0 g/dL   RDW 14.2  11.5 - 15.5 %   Platelets 234  150 - 400 K/uL   Neutrophils Relative % 64  43 - 77 %   Neutro Abs 7.4  1.7 - 7.7 K/uL   Lymphocytes Relative 23  12 - 46 %   Lymphs Abs 2.7  0.7 - 4.0 K/uL   Monocytes Relative 9  3 - 12 %   Monocytes Absolute 1.1 (*) 0.1 - 1.0 K/uL   Eosinophils Relative 4  0 - 5 %   Eosinophils Absolute 0.4  0.0 - 0.7 K/uL   Basophils Relative 0  0 - 1 %   Basophils Absolute 0.0  0.0 - 0.1 K/uL  MAGNESIUM     Status: None   Collection Time    11/17/13  8:10 AM      Result Value Ref Range   Magnesium 1.6  1.5 - 2.5 mg/dL    Imaging / Studies: Dg Chest Port 1 View  11/15/2013   CLINICAL DATA:  Evaluate central venous catheter placement  EXAM: PORTABLE CHEST - 1 VIEW  COMPARISON:  DG CHEST 1V PORT dated 11/12/2013  FINDINGS: A right-sided central venous catheter is appreciated with tip projecting in the region of the superior vena caval right atrial junction. There is no evidence of a  pneumothorax. This is via a right subclavian approach. A right internal jugular catheter is appreciated with tip projecting in the regions superior vena cava. The lung volumes are low. No focal regions of consolidation or focal infiltrates. Cardiac silhouette is enlarged. A left-sided cardiac pacing unit is appreciated projecting within the apical periphery of chest wall. The tips project in the region the right atrium and right ventricle. The osseous structures unremarkable.  IMPRESSION: Right-sided central venous catheter with tip projecting in the region of the superior vena caval right atrial junction. This catheter has been placed in the interim. There is no evidence for pneumothorax. Otherwise no further change in the chest radiograph.   Electronically Signed   By: Margaree Mackintosh M.D.   On: 11/15/2013 18:44    Medications / Allergies: per chart  Antibiotics: Anti-infectives   Start     Dose/Rate Route Frequency Ordered Stop   11/15/13 2200  vancomycin (VANCOCIN) 1,500 mg in sodium chloride 0.9 % 500 mL IVPB  Status:  Discontinued     1,500 mg 250 mL/hr over 120 Minutes Intravenous Every 12 hours 11/15/13 0737 11/15/13 0821   11/15/13 1000  vancomycin (VANCOCIN) 2,000 mg in sodium chloride 0.9 % 500 mL IVPB  Status:  Discontinued     2,000 mg 250 mL/hr over 120 Minutes Intravenous  Once 11/15/13 0737 11/15/13 0821   11/13/13 0800  vancomycin (VANCOCIN) 2,000 mg in sodium chloride 0.9 % 500 mL IVPB  Status:  Discontinued     2,000 mg 250 mL/hr over 120 Minutes Intravenous Every 24 hours 11/12/13 0554 11/15/13 0737   11/12/13 1400  piperacillin-tazobactam (ZOSYN) IVPB 3.375 g  Status:  Discontinued     3.375 g 12.5 mL/hr over 240 Minutes Intravenous 3 times per day 11/12/13 0553 11/15/13 0821   11/12/13 0600  vancomycin (VANCOCIN) 2,250 mg in sodium  chloride 0.9 % 500 mL IVPB     2,250 mg 250 mL/hr over 120 Minutes Intravenous  Once 11/12/13 0553 11/12/13 0800   11/12/13 0115  vancomycin  (VANCOCIN) IVPB 1000 mg/200 mL premix  Status:  Discontinued     1,000 mg 200 mL/hr over 60 Minutes Intravenous  Once 11/12/13 0107 11/12/13 0553   11/12/13 0115  piperacillin-tazobactam (ZOSYN) IVPB 3.375 g     3.375 g 100 mL/hr over 30 Minutes Intravenous STAT 11/12/13 0107 11/12/13 0510       Note: This dictation was prepared with Dragon/digital dictation along with Smartphrase technology. Any transcriptional errors that result from this process are unintentional.

## 2013-11-17 NOTE — Evaluation (Signed)
Physical Therapy Evaluation Patient Details Name: Kurt Cross MRN: [id number] DOB: June 01, 1951 Today's Date: 11/17/2013 Time: [id number] PT Time Calculation (min): 29 min  PT Assessment / Plan / Recommendation History of Present Illness  Kurt Cross is a 63 y.o. male has a history recent diagnosis of a very large abdominal wall abscess that required a significant surgical debridement and a prolonged wound healing course and that has left him on total parenteral nutrition, and debilitated at home at the patient's request instead of in a nursing facility. His wife states that he is on continuous TPN, over the last week he has had several blood draws which have shown a gradually rising potassium level and renal dysfunction though the wife does not know the exact numbers. She was told to bring him to the hospital tonight because of his elevated potassium which was reportedly over 7, he has been difficult to arouse over the last 2 days and hasn't been somnolent or lethargic. There has been no fevers, no vomiting, no coughing, no focal complaints though he has been generally weak and unable to get out of bed. She has been caring for his enterocutaneous fistula with ostomy bags which she has fastened in a very unique way because of the size of his wound. The patient is followed in the surgical clinic and is being closely watched by both his internist Dr. Maudie Mercury as well as his general surgeon Dr. Johney Maine.  Per wife plans had been made for definitive surgical management evaluation in [place] on the 11th of this month  Clinical Impression  Pt able to ambulate short distances in hall with 3 2 sit down rest breaks. Pt will benefit from PT to address problems. Pt plans return home.   PT Assessment  Patient needs continued PT services    Follow Up Recommendations  Home health PT    Does the patient have the potential to tolerate intense rehabilitation      Barriers to Discharge        Equipment  Recommendations  None recommended by PT    Recommendations for Other Services     Frequency Min 3X/week    Precautions / Restrictions Precautions Precautions: Fall   Pertinent Vitals/Pain "25"      Mobility  Transfers Overall transfer level: Needs assistance Equipment used: Rolling walker (2 wheeled) Transfers: Sit to/from Stand Sit to Stand: Mod assist;+2 safety/equipment General transfer comment: multimodal cues for safe sitting, pt does not warn therapist, does not reach for armrests, pt has deceased control of descnt. Pt needs support to stand from recliner. Ambulation/Gait Ambulation/Gait assistance: +2 safety/equipment Ambulation Distance (Feet): 20 Feet (x 2 then 40 ') Assistive device: Rolling walker (2 wheeled) Gait Pattern/deviations: Step-through pattern;Decreased weight shift to right;Decreased weight shift to left;Decreased step length - right General Gait Details: R LE has a tremor during swing. cues for safety    Exercises     PT Diagnosis: Difficulty walking;Generalized weakness;Acute pain  PT Problem List: Decreased strength;Decreased activity tolerance;Decreased balance;Decreased mobility;Decreased knowledge of precautions PT Treatment Interventions: DME instruction;Gait training;Functional mobility training;Therapeutic activities;Therapeutic exercise;Patient/family education     PT Goals(Current goals can be found in the care plan section) Acute Rehab PT Goals Patient Stated Goal: i want to walk PT Goal Formulation: With patient Time For Goal Achievement: 12/01/13 Potential to Achieve Goals: Good  Visit Information  Last PT Received On: 11/17/13 History of Present Illness: Kurt Cross is a 63 y.o. male has a history recent diagnosis of a very large  abdominal wall abscess that required a significant surgical debridement and a prolonged wound healing course and that has left him on total parenteral nutrition, and debilitated at home at the patient's  request instead of in a nursing facility. His wife states that he is on continuous TPN, over the last week he has had several blood draws which have shown a gradually rising potassium level and renal dysfunction though the wife does not know the exact numbers. She was told to bring him to the hospital tonight because of his elevated potassium which was reportedly over 7, he has been difficult to arouse over the last 2 days and hasn't been somnolent or lethargic. There has been no fevers, no vomiting, no coughing, no focal complaints though he has been generally weak and unable to get out of bed. She has been caring for his enterocutaneous fistula with ostomy bags which she has fastened in a very unique way because of the size of his wound. The patient is followed in the surgical clinic and is being closely watched by both his internist Dr. Maudie Mercury as well as his general surgeon Dr. Johney Maine.  Per wife plans had been made for definitive surgical management evaluation in [place] on the 11th of this month       Prior [place] expects to be discharged to:: Private residence Living Arrangements: Spouse/significant other Available Help at Discharge: Family;Available 24 hours/day Type of Home: House Home Access: Ramped entrance Home Layout: One level Home Equipment: Walker - 2 wheels;Cane - single point;Wheelchair - manual;Hospital bed (lift chair) Prior Function Level of Independence: Needs assistance Gait / Transfers Assistance Needed: uses RW ADL's / Homemaking Assistance Needed: wife assisted with sponge bath, socks and pt used cane to "hook" pants around feet Communication Communication: No difficulties    Cognition  Cognition Arousal/Alertness: Awake/alert Behavior During Therapy: WFL for tasks assessed/performed Overall Cognitive Status: Within Functional Limits for tasks assessed Memory: Decreased recall of precautions    Extremity/Trunk Assessment Upper Extremity  Assessment Upper Extremity Assessment: Defer to OT evaluation Lower Extremity Assessment Lower Extremity Assessment: Generalized weakness Cervical / Trunk Assessment Cervical / Trunk Assessment: Normal   Balance Balance Overall balance assessment: Needs assistance Standing balance support: Bilateral upper extremity supported Standing balance-Leahy Scale: Fair Standing balance comment: tends to lean posteriorly initially  End of Session PT - End of Session Activity Tolerance: Patient tolerated treatment well Patient left: in chair;with call bell/phone within reach Nurse Communication: Mobility status  GP     Claretha Cooper 11/17/2013, 2:01 PM

## 2013-11-18 LAB — COMPREHENSIVE METABOLIC PANEL
ALBUMIN: 2.8 g/dL — AB (ref 3.5–5.2)
ALT: 52 U/L (ref 0–53)
AST: 24 U/L (ref 0–37)
Alkaline Phosphatase: 43 U/L (ref 39–117)
BILIRUBIN TOTAL: 0.4 mg/dL (ref 0.3–1.2)
BUN: 29 mg/dL — AB (ref 6–23)
CHLORIDE: 100 meq/L (ref 96–112)
CO2: 21 mEq/L (ref 19–32)
Calcium: 9.1 mg/dL (ref 8.4–10.5)
Creatinine, Ser: 1.38 mg/dL — ABNORMAL HIGH (ref 0.50–1.35)
GFR calc Af Amer: 63 mL/min — ABNORMAL LOW (ref >90)
GFR calc non Af Amer: 54 mL/min — ABNORMAL LOW (ref >90)
Glucose, Bld: 224 mg/dL — ABNORMAL HIGH (ref 70–99)
POTASSIUM: 4.8 meq/L (ref 3.7–5.3)
Sodium: 131 mEq/L — ABNORMAL LOW (ref 137–147)
Total Protein: 7.2 g/dL (ref 6.0–8.3)

## 2013-11-18 LAB — CULTURE, BLOOD (ROUTINE X 2)
CULTURE: NO GROWTH
Culture: NO GROWTH

## 2013-11-18 LAB — GLUCOSE, CAPILLARY
GLUCOSE-CAPILLARY: 207 mg/dL — AB (ref 70–99)
GLUCOSE-CAPILLARY: 203 mg/dL — AB (ref 70–99)

## 2013-11-18 LAB — MAGNESIUM: MAGNESIUM: 1.5 mg/dL (ref 1.5–2.5)

## 2013-11-18 LAB — PHOSPHORUS: Phosphorus: 4 mg/dL (ref 2.3–4.6)

## 2013-11-18 MED ORDER — AMOXICILLIN-POT CLAVULANATE 875-125 MG PO TABS
1.0000 | ORAL_TABLET | Freq: Two times a day (BID) | ORAL | Status: DC
  Administered 2013-11-18: 1 via ORAL
  Filled 2013-11-18 (×2): qty 1

## 2013-11-18 MED ORDER — INSULIN ASPART 100 UNIT/ML ~~LOC~~ SOLN: 12.0000 [IU] | Freq: Three times a day (TID) | SUBCUTANEOUS | Status: DC

## 2013-11-18 MED ORDER — AMOXICILLIN-POT CLAVULANATE 875-125 MG PO TABS: 1.0000 | ORAL_TABLET | Freq: Two times a day (BID) | ORAL | Status: DC

## 2013-11-18 MED ORDER — OXYCODONE HCL ER 20 MG PO T12A: 20.0000 mg | EXTENDED_RELEASE_TABLET | Freq: Two times a day (BID) | ORAL | Status: DC

## 2013-11-18 MED ORDER — ALTEPLASE 2 MG IJ SOLR
2.0000 mg | Freq: Once | INTRAMUSCULAR | Status: AC
  Administered 2013-11-18: 2 mg
  Filled 2013-11-18: qty 2

## 2013-11-18 NOTE — Progress Notes (Signed)
Consulted with pt for support prior to discharge.  Pt in good spirits and speaks of his children as motivation.  Pt has 7 children with 2 children still at home (10 and 61 y/o).

## 2013-11-18 NOTE — Progress Notes (Signed)
Nutrition Services  Calorie Count completed. Pt with excellent appetite, ate 100% of dinner on 2/11.   INTERVENTION: -Continue with DM diet recommendations -Continue w/Glucerna shakes BID -Encouraged new ileostomy diet recommendations -Provided pt with Ensure/Glucerna coupons to assist in pt's compliance with supplement -Wean TPN    Kurt Abide MS RD LDN Clinical Dietitian [phone]

## 2013-11-25 ENCOUNTER — Encounter (INDEPENDENT_AMBULATORY_CARE_PROVIDER_SITE_OTHER): Payer: Self-pay

## 2013-12-14 ENCOUNTER — Telehealth (INDEPENDENT_AMBULATORY_CARE_PROVIDER_SITE_OTHER): Payer: Self-pay

## 2013-12-14 NOTE — Telephone Encounter (Signed)
ADH/ Nira Conn states patient was not seen as scheduled , she is requesting an order for one more day to assess patient before he is  Discharged. Call [name] @ [phone]

## 2013-12-14 NOTE — Telephone Encounter (Addendum)
Forward to [place] and Dr Johney Maine

## 2013-12-16 NOTE — Telephone Encounter (Signed)
OK to continue [facility] as needed.  Issue of noncompliance due to dissatisfaction with HH in the past & transitioning care to [facility]

## 2013-12-16 NOTE — Telephone Encounter (Signed)
LMOM returning [name]'s call. I lmom stating that pt could continue home health as needed per Dr Johney Maine.

## 2014-01-31 ENCOUNTER — Encounter (INDEPENDENT_AMBULATORY_CARE_PROVIDER_SITE_OTHER): Payer: Self-pay

## 2014-02-15 ENCOUNTER — Other Ambulatory Visit (HOSPITAL_COMMUNITY): Payer: Self-pay | Admitting: Cardiology

## 2014-02-15 DIAGNOSIS — R079 Chest pain, unspecified: Secondary | ICD-10-CM

## 2014-03-03 ENCOUNTER — Encounter (HOSPITAL_COMMUNITY): Payer: Medicare Other

## 2014-03-04 ENCOUNTER — Encounter (HOSPITAL_COMMUNITY): Payer: Medicare Other

## 2014-03-04 ENCOUNTER — Other Ambulatory Visit (HOSPITAL_COMMUNITY): Payer: Medicare Other

## 2014-03-07 ENCOUNTER — Encounter (HOSPITAL_COMMUNITY)
Admission: RE | Admit: 2014-03-07 | Discharge: 2014-03-07 | Disposition: A | Discharge status: Home / Self Care | Payer: Medicare Other | Source: Ambulatory Visit | Attending: Cardiology | Admitting: Cardiology

## 2014-03-07 DIAGNOSIS — I259 Chronic ischemic heart disease, unspecified: Secondary | ICD-10-CM | POA: Insufficient documentation

## 2014-03-07 DIAGNOSIS — I4892 Unspecified atrial flutter: Secondary | ICD-10-CM | POA: Insufficient documentation

## 2014-03-07 DIAGNOSIS — Z01818 Encounter for other preprocedural examination: Secondary | ICD-10-CM | POA: Insufficient documentation

## 2014-03-07 DIAGNOSIS — R079 Chest pain, unspecified: Secondary | ICD-10-CM

## 2014-03-07 DIAGNOSIS — I2119 ST elevation (STEMI) myocardial infarction involving other coronary artery of inferior wall: Secondary | ICD-10-CM | POA: Insufficient documentation

## 2014-03-07 DIAGNOSIS — I519 Heart disease, unspecified: Secondary | ICD-10-CM | POA: Insufficient documentation

## 2014-03-08 ENCOUNTER — Encounter (HOSPITAL_COMMUNITY): Payer: Medicare Other

## 2014-03-08 ENCOUNTER — Encounter (HOSPITAL_COMMUNITY)
Admission: RE | Admit: 2014-03-08 | Discharge: 2014-03-08 | Disposition: A | Discharge status: Home / Self Care | Payer: Medicare Other | Source: Ambulatory Visit | Attending: Cardiology | Admitting: Cardiology

## 2014-03-09 ENCOUNTER — Encounter (HOSPITAL_COMMUNITY)
Admission: RE | Admit: 2014-03-09 | Discharge: 2014-03-09 | Disposition: A | Discharge status: Home / Self Care | Payer: Medicare Other | Source: Ambulatory Visit | Attending: Cardiology | Admitting: Cardiology

## 2014-03-09 ENCOUNTER — Other Ambulatory Visit: Payer: Self-pay

## 2014-03-09 DIAGNOSIS — I519 Heart disease, unspecified: Secondary | ICD-10-CM | POA: Diagnosis not present

## 2014-03-09 DIAGNOSIS — I2119 ST elevation (STEMI) myocardial infarction involving other coronary artery of inferior wall: Secondary | ICD-10-CM | POA: Diagnosis not present

## 2014-03-09 DIAGNOSIS — I259 Chronic ischemic heart disease, unspecified: Secondary | ICD-10-CM | POA: Diagnosis not present

## 2014-03-09 DIAGNOSIS — I4892 Unspecified atrial flutter: Secondary | ICD-10-CM | POA: Diagnosis not present

## 2014-03-09 DIAGNOSIS — Z01818 Encounter for other preprocedural examination: Secondary | ICD-10-CM | POA: Diagnosis present

## 2014-03-09 MED ORDER — REGADENOSON 0.4 MG/5ML IV SOLN
INTRAVENOUS | Status: AC
  Administered 2014-03-09: 0.4 mg
  Filled 2014-03-09: qty 5

## 2014-03-09 MED ORDER — TECHNETIUM TC 99M SESTAMIBI GENERIC - CARDIOLITE
30.0000 | Freq: Once | INTRAVENOUS | Status: AC | PRN
  Administered 2014-03-09: 30 via INTRAVENOUS

## 2014-03-16 ENCOUNTER — Encounter (HOSPITAL_COMMUNITY): Payer: Self-pay | Admitting: Pharmacy Technician

## 2014-03-17 ENCOUNTER — Encounter (HOSPITAL_COMMUNITY)
Admission: RE | Disposition: A | Discharge status: Home / Self Care | Payer: Self-pay | Source: Ambulatory Visit | Attending: Cardiology

## 2014-03-17 ENCOUNTER — Ambulatory Visit (HOSPITAL_COMMUNITY)
Admission: RE | Admit: 2014-03-17 | Discharge: 2014-03-17 | Disposition: A | Discharge status: Home / Self Care | Payer: Medicare Other | Source: Ambulatory Visit | Attending: Cardiology | Admitting: Cardiology

## 2014-03-17 DIAGNOSIS — F17200 Nicotine dependence, unspecified, uncomplicated: Secondary | ICD-10-CM | POA: Insufficient documentation

## 2014-03-17 DIAGNOSIS — Z933 Colostomy status: Secondary | ICD-10-CM | POA: Insufficient documentation

## 2014-03-17 DIAGNOSIS — E662 Morbid (severe) obesity with alveolar hypoventilation: Secondary | ICD-10-CM | POA: Insufficient documentation

## 2014-03-17 DIAGNOSIS — I2510 Atherosclerotic heart disease of native coronary artery without angina pectoris: Secondary | ICD-10-CM | POA: Insufficient documentation

## 2014-03-17 DIAGNOSIS — E785 Hyperlipidemia, unspecified: Secondary | ICD-10-CM | POA: Insufficient documentation

## 2014-03-17 DIAGNOSIS — R0989 Other specified symptoms and signs involving the circulatory and respiratory systems: Principal | ICD-10-CM | POA: Insufficient documentation

## 2014-03-17 DIAGNOSIS — I509 Heart failure, unspecified: Secondary | ICD-10-CM | POA: Insufficient documentation

## 2014-03-17 DIAGNOSIS — Z950 Presence of cardiac pacemaker: Secondary | ICD-10-CM | POA: Insufficient documentation

## 2014-03-17 DIAGNOSIS — Z794 Long term (current) use of insulin: Secondary | ICD-10-CM | POA: Insufficient documentation

## 2014-03-17 DIAGNOSIS — I5022 Chronic systolic (congestive) heart failure: Secondary | ICD-10-CM | POA: Insufficient documentation

## 2014-03-17 DIAGNOSIS — I252 Old myocardial infarction: Secondary | ICD-10-CM | POA: Insufficient documentation

## 2014-03-17 DIAGNOSIS — E119 Type 2 diabetes mellitus without complications: Secondary | ICD-10-CM | POA: Insufficient documentation

## 2014-03-17 DIAGNOSIS — I10 Essential (primary) hypertension: Secondary | ICD-10-CM | POA: Insufficient documentation

## 2014-03-17 DIAGNOSIS — E7800 Pure hypercholesterolemia, unspecified: Secondary | ICD-10-CM | POA: Insufficient documentation

## 2014-03-17 DIAGNOSIS — R0609 Other forms of dyspnea: Secondary | ICD-10-CM | POA: Insufficient documentation

## 2014-03-17 DIAGNOSIS — G4733 Obstructive sleep apnea (adult) (pediatric): Secondary | ICD-10-CM | POA: Insufficient documentation

## 2014-03-17 HISTORY — PX: LEFT HEART CATHETERIZATION WITH CORONARY ANGIOGRAM: SHX5451

## 2014-03-17 LAB — GLUCOSE, CAPILLARY
GLUCOSE-CAPILLARY: 43 mg/dL — AB (ref 70–99)
GLUCOSE-CAPILLARY: 56 mg/dL — AB (ref 70–99)
GLUCOSE-CAPILLARY: 119 mg/dL — AB (ref 70–99)
Glucose-Capillary: 71 mg/dL (ref 70–99)
Glucose-Capillary: 93 mg/dL (ref 70–99)

## 2014-03-17 SURGERY — LEFT HEART CATHETERIZATION WITH CORONARY ANGIOGRAM
Anesthesia: LOCAL

## 2014-03-17 MED ORDER — OXYCODONE-ACETAMINOPHEN 5-325 MG PO TABS: 1.0000 | ORAL_TABLET | ORAL | Status: DC | PRN

## 2014-03-17 MED ORDER — SODIUM CHLORIDE 0.9 % IV SOLN
INTRAVENOUS | Status: DC
  Administered 2014-03-17: 13:00:00 via INTRAVENOUS

## 2014-03-17 MED ORDER — LABETALOL HCL 5 MG/ML IV SOLN
INTRAVENOUS | Status: AC
  Filled 2014-03-17: qty 4

## 2014-03-17 MED ORDER — DEXTROSE 50 % IV SOLN: 1.0000 | Freq: Once | INTRAVENOUS | Status: DC

## 2014-03-17 MED ORDER — SODIUM CHLORIDE 0.9 % IV SOLN: 250.0000 mL | INTRAVENOUS | Status: DC | PRN

## 2014-03-17 MED ORDER — DEXTROSE-NACL 5-0.45 % IV SOLN
INTRAVENOUS | Status: DC
  Administered 2014-03-17: 10:00:00 via INTRAVENOUS

## 2014-03-17 MED ORDER — LABETALOL HCL 5 MG/ML IV SOLN
10.0000 mg | Freq: Once | INTRAVENOUS | Status: AC
  Administered 2014-03-17: 10 mg via INTRAVENOUS

## 2014-03-17 MED ORDER — ASPIRIN 81 MG PO CHEW: 81.0000 mg | CHEWABLE_TABLET | ORAL | Status: DC

## 2014-03-17 MED ORDER — SODIUM CHLORIDE 0.9 % IJ SOLN: 3.0000 mL | INTRAMUSCULAR | Status: DC | PRN

## 2014-03-17 MED ORDER — DEXTROSE 50 % IV SOLN
INTRAVENOUS | Status: AC
  Administered 2014-03-17 (×2): 25 mL
  Filled 2014-03-17: qty 50

## 2014-03-17 MED ORDER — OXYCODONE-ACETAMINOPHEN 5-325 MG PO TABS
ORAL_TABLET | ORAL | Status: AC
  Filled 2014-03-17: qty 2

## 2014-03-17 MED ORDER — SODIUM CHLORIDE 0.9 % IJ SOLN: 3.0000 mL | Freq: Two times a day (BID) | INTRAMUSCULAR | Status: DC

## 2014-03-17 MED ORDER — DEXTROSE 50 % IV SOLN
INTRAVENOUS | Status: AC
  Administered 2014-03-17: 50 mL
  Filled 2014-03-17: qty 50

## 2014-03-17 MED ORDER — SODIUM CHLORIDE 0.9 % IV SOLN
INTRAVENOUS | Status: DC
  Administered 2014-03-17: 09:00:00 via INTRAVENOUS

## 2014-03-17 MED FILL — Fentanyl Citrate Inj 0.05 MG/ML: INTRAMUSCULAR | Qty: 2 | Status: AC

## 2014-03-17 MED FILL — Midazolam HCl Inj 2 MG/2ML (Base Equivalent): INTRAMUSCULAR | Qty: 2 | Status: AC

## 2014-03-17 MED FILL — Lidocaine HCl Local Preservative Free (PF) Inj 1%: INTRAMUSCULAR | Qty: 30 | Status: AC

## 2014-03-17 MED FILL — Heparin Sodium (Porcine) 2 Unit/ML in Sodium Chloride 0.9%: INTRAMUSCULAR | Qty: 1000 | Status: AC

## 2014-03-17 NOTE — Discharge Instructions (Signed)
Angiography, Care After °Refer to this sheet in the next few weeks. These instructions provide you with information on caring for yourself after your procedure. Your health care provider may also give you more specific instructions. Your treatment has been planned according to current medical practices, but problems sometimes occur. Call your health care provider if you have any problems or questions after your procedure.  °WHAT TO EXPECT AFTER THE PROCEDURE °After your procedure, it is typical to have the following sensations: °· Minor discomfort or tenderness and a small bump at the catheter insertion site. The bump should usually decrease in size and tenderness within 1 to 2 weeks. °· Any bruising will usually fade within 2 to 4 weeks. °HOME CARE INSTRUCTIONS  °· You may need to keep taking blood thinners if they were prescribed for you. Only take over-the-counter or prescription medicines for pain, fever, or discomfort as directed by your health care provider. °· Do not apply powder or lotion to the site. °· Do not sit in a bathtub, swimming pool, or whirlpool for 5 to 7 days. °· You may shower 24 hours after the procedure. Remove the bandage (dressing) and gently wash the site with plain soap and water. Gently pat the site dry. °· Inspect the site at least twice daily. °· Limit your activity for the first 48 hours. Do not bend, squat, or lift anything over 20 lb (9 kg) or as directed by your health care provider. °· Do not drive home if you are discharged the day of the procedure. Have someone else drive you. Follow instructions about when you can drive or return to work. °SEEK MEDICAL CARE IF: °· You get lightheaded when standing up. °· You have drainage (other than a small amount of blood on the dressing). °· You have chills. °· You have a fever. °· You have redness, warmth, swelling, or pain at the insertion site. °SEEK IMMEDIATE MEDICAL CARE IF:  °· You develop chest pain or shortness of breath, feel faint,  or pass out. °· You have bleeding, swelling larger than a walnut, or drainage from the catheter insertion site. °· You develop pain, discoloration, coldness, or severe bruising in the leg or arm that held the catheter. °· You develop bleeding from any other place, such as the bowels. You may see bright red blood in your urine or stools, or your stools may appear black and tarry. °· You have heavy bleeding from the site. If this happens, hold pressure on the site. °MAKE SURE YOU: °· Understand these instructions. °· Will watch your condition. °· Will get help right away if you are not doing well or get worse. °Document Released: 04/11/2005 Document Revised: 05/26/2013 Document Reviewed: 02/15/2013 °ExitCare® Patient Information ©2014 [employer]. ° °

## 2014-03-17 NOTE — Interval H&P Note (Signed)
Cath Lab Visit (complete for each Cath Lab visit)  Clinical Evaluation Leading to the Procedure:   ACS: no  Non-ACS:    Anginal Classification: CCS III  Anti-ischemic medical therapy: Maximal Therapy (2 or more classes of medications)  Non-Invasive Test Results: Intermediate-risk stress test findings: cardiac mortality 1-3%/year  Prior CABG: No previous CABG      History and Physical Interval Note:  03/17/2014 10:57 AM  Kurt Cross  has presented today for surgery, with the diagnosis of abnormal stress/cp  The various methods of treatment have been discussed with the patient and family. After consideration of risks, benefits and other options for treatment, the patient has consented to  Procedure(s): LEFT HEART CATHETERIZATION WITH CORONARY ANGIOGRAM (N/A) as a surgical intervention .  The patient's history has been reviewed, patient examined, no change in status, stable for surgery.  I have reviewed the patient's chart and labs.  Questions were answered to the patient's satisfaction.     [name]

## 2014-03-17 NOTE — H&P (Signed)
Typed H&P in the chart needs to be scanned 

## 2014-03-18 NOTE — Op Note (Signed)
[name], Cross NO.:  [id number]  MEDICAL RECORD NO.:  [id number]  LOCATION:  [facility]                         FACILITY:  [facility]  PHYSICIAN:  Allegra Lai. Terrence Dupont, M.D. DATE OF BIRTH:  1951/04/29  DATE OF PROCEDURE:  03/17/2014 DATE OF DISCHARGE:  03/17/2014                              OPERATIVE REPORT   PROCEDURE:  Left cardiac cath with selective left and right coronary angiography, LV graphy via right groin using Judkins technique.  INDICATION FOR THE PROCEDURE:  Kurt Cross is a 63 year old male with past medical history significant for multiple medical problems, i.e., hypertension, insulin-requiring diabetes mellitus, history of congestive heart failure secondary to depressed LV systolic function, history of sleep apnea, morbid obesity, history of multiple laparotomies in the past for ventral hernia repair recently discharged from the hospital, had abdominal wall abscess and drains and now is scheduled for closure of the ventral hernia and colostomy, hypercholesteremia, obstructive sleep apnea, obesity hypoventilation syndrome, history of complete heart block, status post permanent pacemaker, was seen in my office for preop clearance.  The patient complained of exertional dyspnea associated with feeling weak, tired, fatigue, no energy.  The patient denies any chest pain, nausea, vomiting, diaphoresis, although his activity is very limited.  The patient denies any palpitation, lightheadedness, or syncope.  The patient underwent Lexiscan Myoview on March 09, 2014, which showed inferior wall infarct with moderate size peri-infarct ischemia with EF of 40%.  Due to exertional dyspnea probably angina equivalent, multiple risk factors, and positive nuclear stress test, discussed with the patient at length regarding left cath, possible PTCA stenting, its risks and benefits, i.e., death, MI, stroke, need for emergency CABG, local vascular complications, also need for bare  metal stenting versus drug-eluting stents and need for long-term dual antiplatelet with drug- eluting stent, and consented for PCI.  DESCRIPTION OF PROCEDURE:  After obtaining the informed consent, the patient was brought to the cath lab and was placed on fluoroscopy table. Right groin was prepped and draped in the usual fashion.  A 1% Xylocaine was used for local anesthesia in the right groin.  With the help of thin wall needle, 5-French arterial sheath was placed.  The sheath was aspirated and flushed.  Next, 5-French left Judkins catheter was advanced over the wire under fluoroscopic guidance up to the ascending aorta.  Wire was pulled out.  The catheter was aspirated and connected to the Manifold.  Catheter was further advanced and engaged into left coronary ostium.  Multiple views of the left system were taken.  Next, catheter was disengaged and was pulled out over the wire and was replaced with 5-French right Judkins catheter, which was advanced over the wire under fluoroscopic guidance up to the ascending aorta.  Wire was pulled out.  The catheter was aspirated and connected to the Manifold.  Catheter was further advanced and attempted to engage into right coronary ostium without success.  This catheter was exchanged to AL1 5-French diagnostic catheter, which was advanced over the wire under fluoroscopic guidance up to the ascending aorta.  Wire was pulled out. The catheter was aspirated and connected to the Manifold.  Catheter was further advanced and engaged into  right coronary ostium.  Multiple views of the right system were taken.  Next, catheter was disengaged and was pulled out over the wire and was replaced with 5-French.  Pigtail catheter which was advanced over the wire under fluoroscopic guidance up to the ascending aorta.  Wire was pulled out.  The catheter was aspirated and connected to the Manifold.  Catheter was further advanced across the aortic valve into the LV.   LV pressures were recorded.  Next, LV graft was done in 30-degree RAO position.  Post-angiographic pressures were recorded from LV and then pullback pressures were recorded from the aorta.  There was no gradient across the aortic valve. Next, the pigtail catheter was pulled out over the wire.  Sheaths were aspirated and flushed.  FINDINGS:  LV showed global hypokinesia.  EF of 35-40%.  There was mild- to-moderate LVH.  Left main was patent.  LAD has 10-15% mid stenosis.  Diagonal 1 was moderate sized, which was patent.  Diagonal 2 was small which was patent.  Left circumflex has 5-10% proximal stenosis.  OM1 and OM2 were very small.  OM3 was large which was patent.  OM4 was small which was patent.  RCA was large which was patent.  PDA and PLV branches were patent.  The patient tolerated the procedure well.  There were no complications.  The patient was transferred to recovery room in stable condition.     Allegra Lai. Terrence Dupont, M.D.     MNH/MEDQ  D:  03/17/2014  T:  03/18/2014  Job:  [id number]

## 2014-04-29 HISTORY — PX: OTHER SURGICAL HISTORY: SHX169

## 2014-08-04 ENCOUNTER — Ambulatory Visit: Payer: Medicare Other | Attending: Physical Therapy | Admitting: Physical Therapy

## 2014-08-04 DIAGNOSIS — Z5189 Encounter for other specified aftercare: Secondary | ICD-10-CM | POA: Insufficient documentation

## 2014-08-04 DIAGNOSIS — E119 Type 2 diabetes mellitus without complications: Secondary | ICD-10-CM | POA: Insufficient documentation

## 2014-08-04 DIAGNOSIS — G629 Polyneuropathy, unspecified: Secondary | ICD-10-CM | POA: Diagnosis not present

## 2014-08-04 DIAGNOSIS — R262 Difficulty in walking, not elsewhere classified: Secondary | ICD-10-CM | POA: Insufficient documentation

## 2014-08-04 DIAGNOSIS — E669 Obesity, unspecified: Secondary | ICD-10-CM | POA: Diagnosis not present

## 2014-08-04 DIAGNOSIS — I10 Essential (primary) hypertension: Secondary | ICD-10-CM | POA: Diagnosis not present

## 2014-08-04 DIAGNOSIS — M25569 Pain in unspecified knee: Secondary | ICD-10-CM | POA: Insufficient documentation

## 2014-08-08 ENCOUNTER — Ambulatory Visit: Payer: Medicare Other | Attending: Internal Medicine | Admitting: Physical Therapy

## 2014-08-08 DIAGNOSIS — E669 Obesity, unspecified: Secondary | ICD-10-CM | POA: Insufficient documentation

## 2014-08-08 DIAGNOSIS — R262 Difficulty in walking, not elsewhere classified: Secondary | ICD-10-CM | POA: Diagnosis not present

## 2014-08-08 DIAGNOSIS — G629 Polyneuropathy, unspecified: Secondary | ICD-10-CM | POA: Diagnosis not present

## 2014-08-08 DIAGNOSIS — I10 Essential (primary) hypertension: Secondary | ICD-10-CM | POA: Insufficient documentation

## 2014-08-08 DIAGNOSIS — E119 Type 2 diabetes mellitus without complications: Secondary | ICD-10-CM | POA: Diagnosis not present

## 2014-08-08 DIAGNOSIS — Z5189 Encounter for other specified aftercare: Secondary | ICD-10-CM | POA: Insufficient documentation

## 2014-08-08 DIAGNOSIS — M25569 Pain in unspecified knee: Secondary | ICD-10-CM | POA: Diagnosis not present

## 2014-08-10 ENCOUNTER — Ambulatory Visit: Payer: Medicare Other | Admitting: Physical Therapy

## 2014-08-10 DIAGNOSIS — Z5189 Encounter for other specified aftercare: Secondary | ICD-10-CM | POA: Diagnosis not present

## 2014-08-16 ENCOUNTER — Ambulatory Visit: Payer: Medicare Other | Admitting: Physical Therapy

## 2014-08-18 ENCOUNTER — Ambulatory Visit: Payer: Medicare Other | Admitting: Physical Therapy

## 2014-09-15 ENCOUNTER — Encounter (HOSPITAL_COMMUNITY): Payer: Self-pay | Admitting: Cardiology

## 2014-09-21 ENCOUNTER — Other Ambulatory Visit: Payer: Self-pay | Admitting: *Deleted

## 2014-09-21 ENCOUNTER — Encounter: Payer: Self-pay | Admitting: *Deleted

## 2014-09-22 ENCOUNTER — Telehealth: Payer: Self-pay | Admitting: *Deleted

## 2014-09-22 ENCOUNTER — Encounter: Payer: Self-pay | Admitting: Cardiology

## 2014-09-22 ENCOUNTER — Ambulatory Visit (INDEPENDENT_AMBULATORY_CARE_PROVIDER_SITE_OTHER): Payer: Medicare Other | Admitting: Cardiology

## 2014-09-22 VITALS — BP 138/86 | HR 51 | Ht 71.0 in | Wt 222.0 lb

## 2014-09-22 DIAGNOSIS — E1142 Type 2 diabetes mellitus with diabetic polyneuropathy: Secondary | ICD-10-CM

## 2014-09-22 DIAGNOSIS — I5022 Chronic systolic (congestive) heart failure: Secondary | ICD-10-CM

## 2014-09-22 DIAGNOSIS — E1140 Type 2 diabetes mellitus with diabetic neuropathy, unspecified: Secondary | ICD-10-CM

## 2014-09-22 DIAGNOSIS — Z950 Presence of cardiac pacemaker: Secondary | ICD-10-CM

## 2014-09-22 DIAGNOSIS — I482 Chronic atrial fibrillation, unspecified: Secondary | ICD-10-CM

## 2014-09-22 LAB — CBC WITH DIFFERENTIAL/PLATELET
Basophils Absolute: 0 10*3/uL (ref 0.0–0.1)
Basophils Relative: 0.4 % (ref 0.0–3.0)
Eosinophils Absolute: 0.2 10*3/uL (ref 0.0–0.7)
Eosinophils Relative: 2.2 % (ref 0.0–5.0)
HEMATOCRIT: 45.6 % (ref 39.0–52.0)
Hemoglobin: 14.8 g/dL (ref 13.0–17.0)
LYMPHS ABS: 2.5 10*3/uL (ref 0.7–4.0)
LYMPHS PCT: 21.8 % (ref 12.0–46.0)
MCHC: 32.4 g/dL (ref 30.0–36.0)
MCV: 94.7 fl (ref 78.0–100.0)
MONO ABS: 0.7 10*3/uL (ref 0.1–1.0)
Monocytes Relative: 5.9 % (ref 3.0–12.0)
Neutro Abs: 7.9 10*3/uL — ABNORMAL HIGH (ref 1.4–7.7)
Neutrophils Relative %: 69.7 % (ref 43.0–77.0)
PLATELETS: 215 10*3/uL (ref 150.0–400.0)
RBC: 4.81 Mil/uL (ref 4.22–5.81)
RDW: 18 % — AB (ref 11.5–15.5)
WBC: 11.3 10*3/uL — AB (ref 4.0–10.5)

## 2014-09-22 LAB — BASIC METABOLIC PANEL
BUN: 24 mg/dL — ABNORMAL HIGH (ref 6–23)
CALCIUM: 9 mg/dL (ref 8.4–10.5)
CO2: 27 meq/L (ref 19–32)
Chloride: 100 mEq/L (ref 96–112)
Creatinine, Ser: 1.5 mg/dL (ref 0.4–1.5)
GFR: 63.55 mL/min (ref >60.00)
Glucose, Bld: 39 mg/dL — CL (ref 70–99)
Potassium: 3.5 mEq/L (ref 3.5–5.1)
Sodium: 138 mEq/L (ref 135–145)

## 2014-09-22 NOTE — Patient Instructions (Signed)
Will obtain labs today and call you with the results (CBC/BMET)  STOP ASPIRIN  INCREASE YOUR ELIQUIS TO 5 MG TWICE A DAY  Your physician has requested that you have an echocardiogram. Echocardiography is a painless test that uses sound waves to create images of your heart. It provides your doctor with information about the size and shape of your heart and how well your heart's chambers and valves are working. This procedure takes approximately one hour. There are no restrictions for this procedure.  A chest x-Morell takes a picture of the organs and structures inside the chest, including the heart, lungs, and blood vessels. This test can show several things, including, whether the heart is enlarges; whether fluid is building up in the lungs; and whether pacemaker / defibrillator leads are still in place. [place]  Your physician recommends that you schedule a follow-up appointment in: [place]

## 2014-09-22 NOTE — Telephone Encounter (Signed)
Patient seen by  Dr. Mare Ferrari today and would like to get him into the Pacemaker clinic Will forward to device clinic for scheduling

## 2014-09-22 NOTE — Progress Notes (Signed)
Kurt Cross Date of Birth:  1951-08-07 [facility] [address] [place], [place]  [address] [phone]        Fax   [phone]   History of Present Illness: This pleasant 63 year old African-American male is seen by me for the first time today.  He is being seen at the request of Dr. [name].  The patient is a former cardiology patient of Dr. Doylene Canard.  The patient has a history of chronic systolic heart failure.  He has a history of chronic atrial fibrillation/flutter.  He has had a prior ventricular pacemaker for severe bradycardia.  He was in [place] in July 2015 for abdominal surgery and it was noted that the pacemaker battery was end-of-life and a new generator battery was placed.  The patient has been on anticoagulation for his atrial flutter fibrillation.  He is carrying samples of Eliquis 5 mg tablets but has only been taking them once a day.  The patient denies any chest pain.  He states that his peripheral edema and dyspnea have improved after Kurt Cross increased his Lasix to 80 mg daily several weeks ago.\ The patient's last echocardiogram in the chart was 08/12/13 which showed an ejection fraction of 50-55%. The patient had a Myoview stress test on 03/09/14 showing an ejection fraction of 40% with evidence of an old inferior wall infarct with moderate peri-infarct ischemia.  The patient denies any symptoms of chest pain or angina.  He has not had any history of stroke or TIA. His family history reveals that his mother was a severe diabetic and died of diabetic coma at age 51.  His father was absent from the home and his health history is unknown. Social history reveals that the patient is on medical disability. Past medical history reveals that the patient has had extensive abdominal surgery for complications following a mechanical perforation of the rectum.  Current Outpatient Prescriptions  Medication Sig Dispense Refill  . ACCU-CHEK SMARTVIEW test strip   13    . apixaban (ELIQUIS) 5 MG TABS tablet Take 5 mg by mouth 2 (two) times daily.    . Canagliflozin (INVOKANA) 100 MG TABS Take 100 mg by mouth daily.    . carvedilol (COREG) 25 MG tablet Take 25 mg by mouth 2 (two) times daily.  3  . furosemide (LASIX) 80 MG tablet Take 80 mg by mouth.    . insulin aspart (NOVOLOG) 100 UNIT/ML injection Inject 10 Units into the skin 2 (two) times daily.    . insulin NPH Human (HUMULIN N,NOVOLIN N) 100 UNIT/ML injection Inject 30 Units into the skin 2 (two) times daily before a meal.     . loperamide (IMODIUM) 2 MG capsule Take 4 mg by mouth every 8 (eight) hours as needed.    [name] Kitchen oxyCODONE-acetaminophen (PERCOCET) 10-325 MG per tablet Take 1 tablet by mouth every 6 (six) hours as needed for pain.     . ramipril (ALTACE) 5 MG capsule Take 5 mg by mouth daily.  3  . rosuvastatin (CRESTOR) 10 MG tablet Take 10 mg by mouth daily.    . sodium bicarbonate 650 MG tablet Take 650 mg by mouth 3 (three) times daily.    [name] Kitchen spironolactone (ALDACTONE) 25 MG tablet Take 25 mg by mouth 2 (two) times daily.  3   No current facility-administered medications for this visit.    No Known Allergies  Patient Active Problem List   Diagnosis Date Noted  . Hyperkalemia 11/12/2013  .  AKI (acute kidney injury) 11/12/2013  . Severe sepsis with acute organ dysfunction 11/12/2013  . Short gut syndrome due to distal jejunal SB fistula 10/22/2013  . Atrial fibrillation/flutter 10/06/2013  . 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 Recurrent ventral incisional hernia 08/14/2013  . AAA (abdominal aortic aneurysm)/ 4.5 cm ascending per CT angio 08/01/13 08/12/2013  . Right leg pain 08/11/2013  . Chronic combined systolic and diastolic CHF (congestive heart failure) 08/04/2013  . Acute-on-chronic respiratory failure secondary to probable community-acquired pneumonia 08/24/2012  .  Hypertension 06/28/2012  . Diabetes mellitus, insulin dependent (IDDM), uncontrolled 09/01/2007  . HYPERLIPIDEMIA 09/01/2007  . Obesity, Class III, BMI 40-49.9 (morbid obesity) 09/01/2007  . OBSTRUCTIVE SLEEP APNEA 09/01/2007    History  Smoking status  . Former Smoker -- 1.00 packs/day for 40 years  . Types: Cigarettes  . Quit date: 08/21/2012  Smokeless tobacco  . Never Used    History  Alcohol Use  . 0.0 oz/week  . 0 Not specified per week    Comment: occasional    Family History  Problem Relation Age of Onset  . Diabetes Mother   . Alcoholism Father   . Heart Problems Maternal Grandmother     Review of Systems: Constitutional: no fever chills diaphoresis or fatigue or change in weight.  Head and neck: no hearing loss, no epistaxis, no photophobia or visual disturbance. Respiratory: No cough, shortness of breath or wheezing. Cardiovascular: No chest pain peripheral edema, palpitations. Gastrointestinal: No abdominal distention, no abdominal pain, no change in bowel habits hematochezia or melena. Genitourinary: No dysuria, no frequency, no urgency, no nocturia. Musculoskeletal:No arthralgias, no back pain, no gait disturbance or myalgias. Neurological: No dizziness, no headaches, no numbness, no seizures, no syncope, no weakness, no tremors. Hematologic: No lymphadenopathy, no easy bruising. Psychiatric: No confusion, no hallucinations, no sleep disturbance.   Wt Readings from Last 3 Encounters:  09/22/14 222 lb (100.699 kg)  03/17/14 302 lb (136.986 kg)  11/12/13 368 lb 2.7 oz (167 kg)    Physical Exam: Filed Vitals:   09/22/14 1119  BP: 138/86  Pulse: 51  The patient appears to be in no distress.  He uses a walker at home.  Today he is in a wheelchair.  Head and neck exam reveals that the pupils are equal and reactive.  The extraocular movements are full.  There is no scleral icterus.  Mouth and pharynx are benign.  No lymphadenopathy.  No carotid bruits.   The jugular venous pressure is normal.  Thyroid is not enlarged or tender.  Chest is clear to percussion and auscultation.  No rales or rhonchi.  Expansion of the chest is symmetrical.  Heart reveals no abnormal lift or heave.  First and second heart sounds are normal.  There is no murmur gallop rub or click.  The abdomen is soft and nontender.  Bowel sounds are normoactive.  There is no hepatosplenomegaly or mass.  There are no abdominal bruits.  There is an extensive lower abdominal incision extending across the entire abdomen which is healing by secondary intention  Extremities reveal no phlebitis or edema.  Pedal pulses are fair.  There is no cyanosis or clubbing.  Neurologic exam is normal strength and no lateralizing weakness.  There is diminished sensation consistent with diabetic neuropathy  Integument reveals no rash  EKG today shows atrial flutter fibrillation.  There is a paced ventricular rhythm at  51/m.  Since the previous tracing of 03/17/14 the ventricular rate is slower since battery change in July 2015  Assessment / Plan: 1.  Chronic systolic heart failure 2.  Chronic atrial flutter-fibrillation.  Chads vasc score is 4 for congestive heart failure, diabetes, vascular disease (abdominal aortic aneurysm) and hypertension. 3.  Type 2 diabetes with peripheral neuropathy. 4.  Functioning ventricular pacemaker 5.  History of abdominal aortic aneurysm 6.  History of obstructive sleep apnea 7.  Hyperlipidemia  Disposition: His medications were reviewed.  He is on appropriate medication for his chronic systolic heart failure.  He was not taking adequate anticoagulation.  We have instructed him to increase his Eliquis up to twice a day.  We are checking a basal metabolic panel today to be sure that his renal function is satisfactory.  We will update his echocardiogram.  We will get a chest x-Doughty.  We will also arrange for appropriate follow-up of his [employer] pacemaker in the EP  clinic.  He apparently had seen Dr. Lovena Le in 2008. I've asked him to let me check him again for cardiology follow-up in 3 months for an office visit and EKG. Many thanks for the opportunity to see this pleasant gentleman with you.

## 2014-09-23 NOTE — Telephone Encounter (Signed)
LMVOM w/ my direct #

## 2014-09-27 ENCOUNTER — Ambulatory Visit (HOSPITAL_COMMUNITY): Payer: Medicare Other | Attending: Cardiology

## 2014-09-27 DIAGNOSIS — I714 Abdominal aortic aneurysm, without rupture: Secondary | ICD-10-CM | POA: Insufficient documentation

## 2014-09-27 DIAGNOSIS — I10 Essential (primary) hypertension: Secondary | ICD-10-CM | POA: Diagnosis not present

## 2014-09-27 DIAGNOSIS — N179 Acute kidney failure, unspecified: Secondary | ICD-10-CM | POA: Insufficient documentation

## 2014-09-27 DIAGNOSIS — I4892 Unspecified atrial flutter: Secondary | ICD-10-CM | POA: Diagnosis present

## 2014-09-27 DIAGNOSIS — E785 Hyperlipidemia, unspecified: Secondary | ICD-10-CM | POA: Diagnosis not present

## 2014-09-27 DIAGNOSIS — I517 Cardiomegaly: Secondary | ICD-10-CM | POA: Insufficient documentation

## 2014-09-27 DIAGNOSIS — I509 Heart failure, unspecified: Secondary | ICD-10-CM | POA: Insufficient documentation

## 2014-09-27 DIAGNOSIS — A419 Sepsis, unspecified organism: Secondary | ICD-10-CM | POA: Insufficient documentation

## 2014-09-27 DIAGNOSIS — I482 Chronic atrial fibrillation, unspecified: Secondary | ICD-10-CM

## 2014-09-27 DIAGNOSIS — E669 Obesity, unspecified: Secondary | ICD-10-CM | POA: Diagnosis not present

## 2014-09-27 DIAGNOSIS — Z87891 Personal history of nicotine dependence: Secondary | ICD-10-CM | POA: Diagnosis not present

## 2014-09-27 DIAGNOSIS — E119 Type 2 diabetes mellitus without complications: Secondary | ICD-10-CM | POA: Diagnosis not present

## 2014-09-27 DIAGNOSIS — E875 Hyperkalemia: Secondary | ICD-10-CM | POA: Insufficient documentation

## 2014-09-27 DIAGNOSIS — I5022 Chronic systolic (congestive) heart failure: Secondary | ICD-10-CM

## 2014-09-27 DIAGNOSIS — G4733 Obstructive sleep apnea (adult) (pediatric): Secondary | ICD-10-CM | POA: Insufficient documentation

## 2014-09-27 DIAGNOSIS — I080 Rheumatic disorders of both mitral and aortic valves: Secondary | ICD-10-CM | POA: Insufficient documentation

## 2014-09-27 NOTE — Progress Notes (Signed)
2D Echo completed. 09/27/2014 

## 2014-09-28 ENCOUNTER — Telehealth: Payer: Self-pay | Admitting: *Deleted

## 2014-09-28 NOTE — Telephone Encounter (Signed)
pt and wife notified about echo results as well as weight loss recommendation per Dr. Mare Ferrari. Wife verbalized understanding to results given today.

## 2014-10-17 DIAGNOSIS — I509 Heart failure, unspecified: Secondary | ICD-10-CM | POA: Diagnosis not present

## 2014-10-20 DIAGNOSIS — I10 Essential (primary) hypertension: Secondary | ICD-10-CM | POA: Diagnosis not present

## 2014-10-20 DIAGNOSIS — E118 Type 2 diabetes mellitus with unspecified complications: Secondary | ICD-10-CM | POA: Diagnosis not present

## 2014-10-21 DIAGNOSIS — I509 Heart failure, unspecified: Secondary | ICD-10-CM | POA: Diagnosis not present

## 2014-10-28 IMAGING — CT CT ABD-PELV W/O CM
2 of 8 series · 13 of 46 positions shown, 18 images · non-contrast
Comparison: None.

CLINICAL DATA: Fever, leukocytosis, prior hernia repairs and
appendectomy

EXAM:
CT ABDOMEN AND PELVIS WITHOUT CONTRAST
TECHNIQUE: Multidetector CT imaging of the abdomen and pelvis was performed
following the standard protocol without intravenous contrast.

[Series 10: rtn a/p w/o · axial · non-contrast · 0.98mm/px · z∈[-460,-40]mm · 10 of 102 slices shown, 15 images]
[im 9/102  soft-tissue]
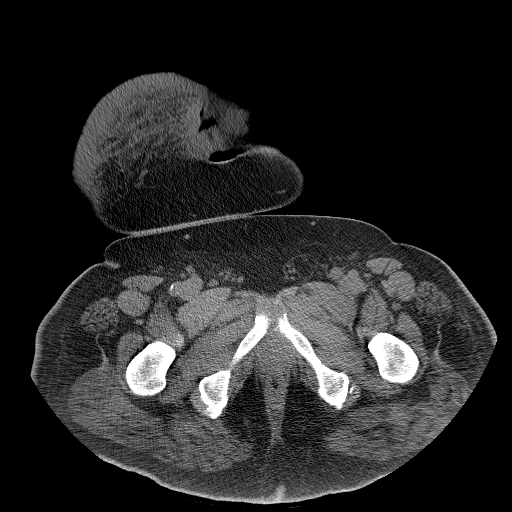
[im 9/102  bone]
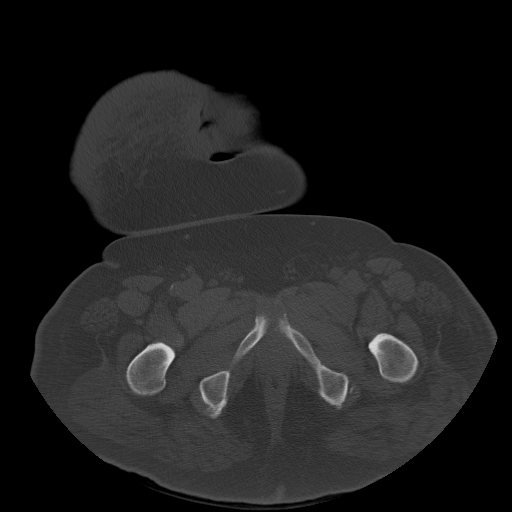
[im 17/102  soft-tissue]
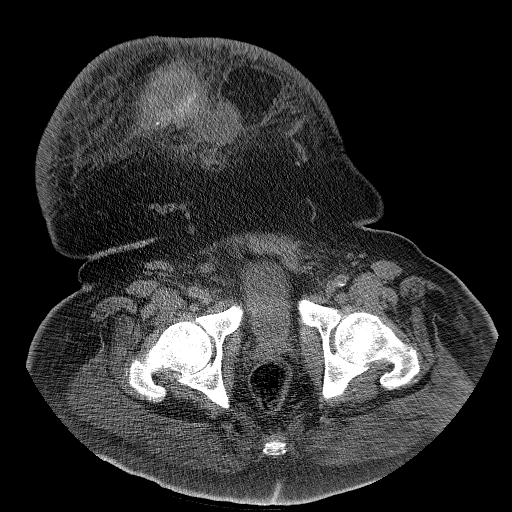
[im 34/102  soft-tissue]
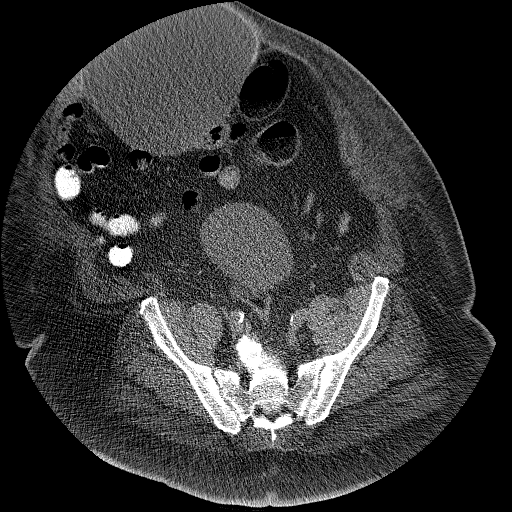
[im 43/102  soft-tissue]
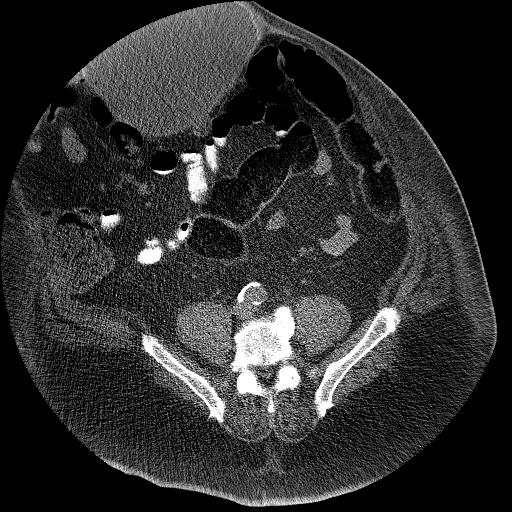
[im 51/102  soft-tissue]
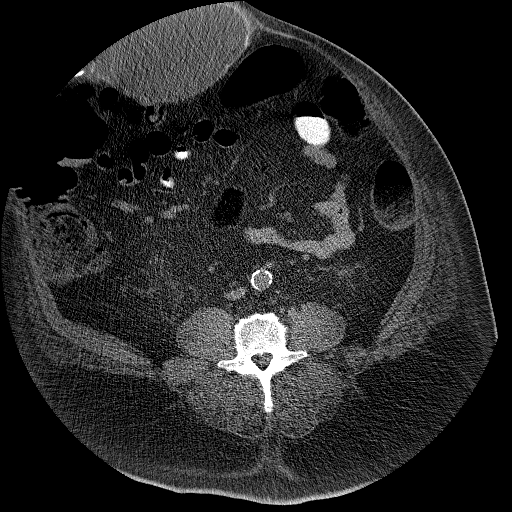
[im 59/102  soft-tissue]
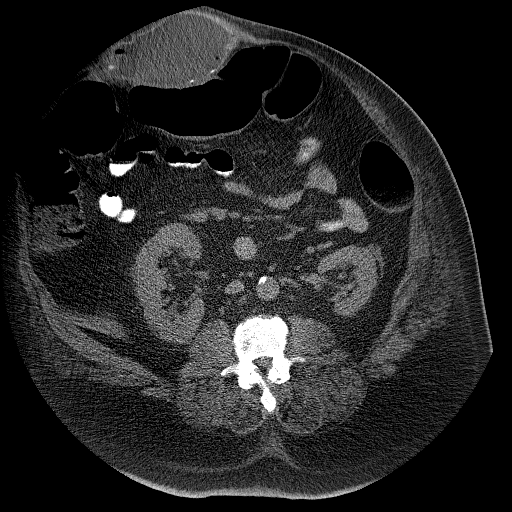
[im 68/102  soft-tissue]
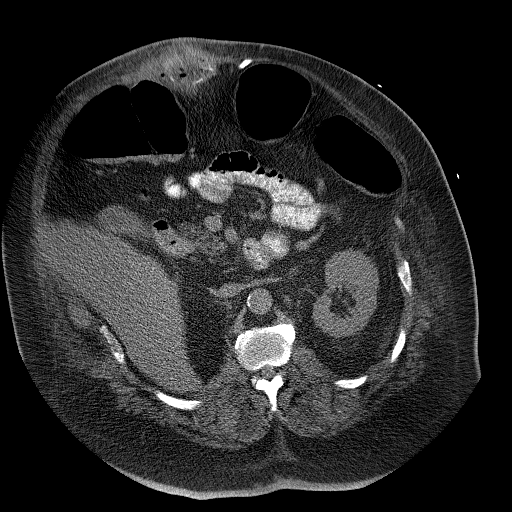
[im 68/102  lung]
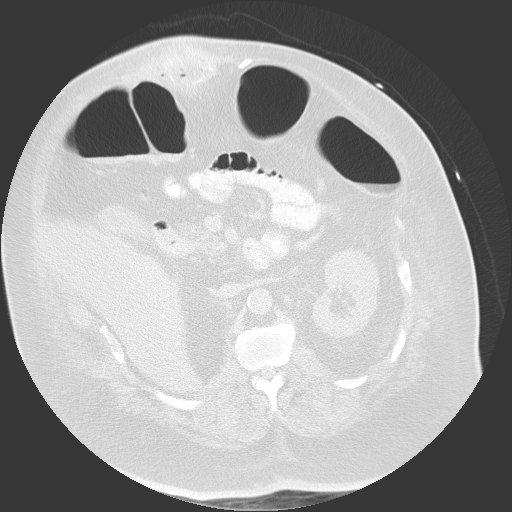
[im 76/102  lung]
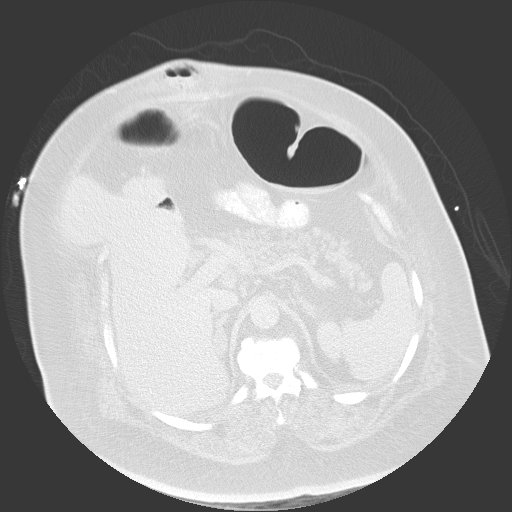
[im 85/102  soft-tissue]
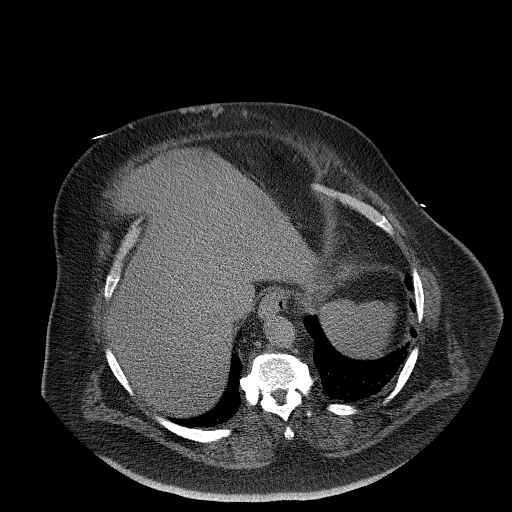
[im 85/102  lung]
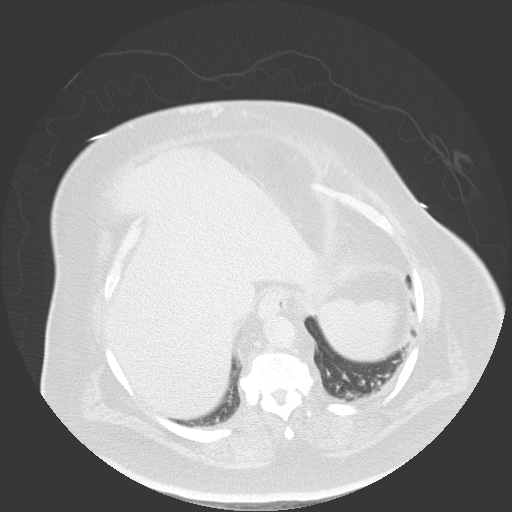
[im 93/102  soft-tissue]
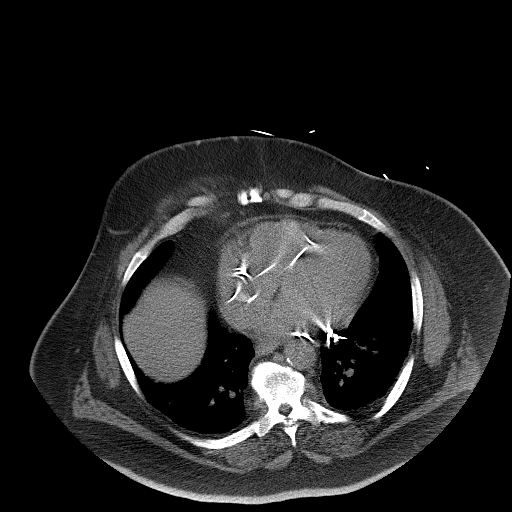
[im 93/102  lung]
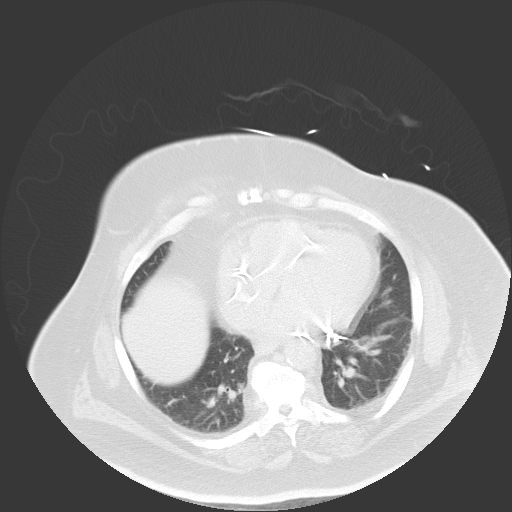
[im 93/102  bone]
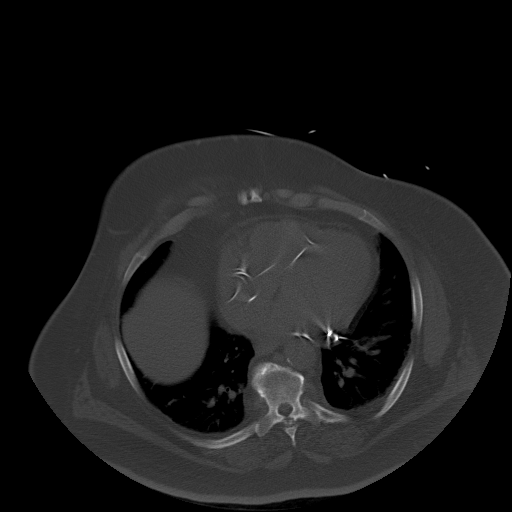

[Series 602: <mpr thick range> · coronal · 1.00mm/px · 3 of 162 slices shown]
[im 41/162  soft-tissue]
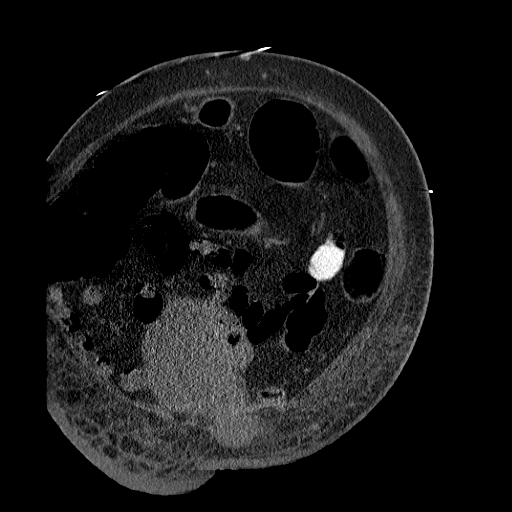
[im 81/162  soft-tissue]
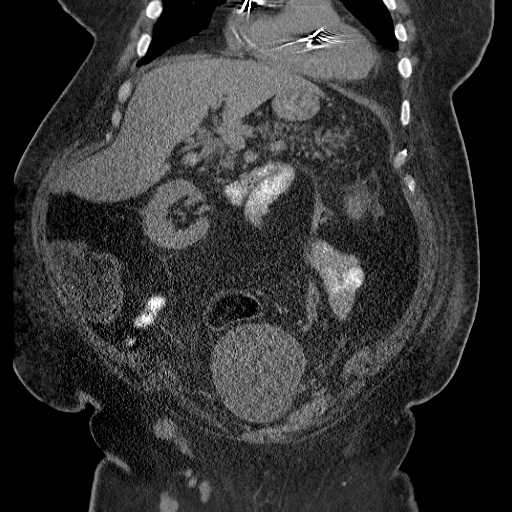
[im 121/162  soft-tissue]
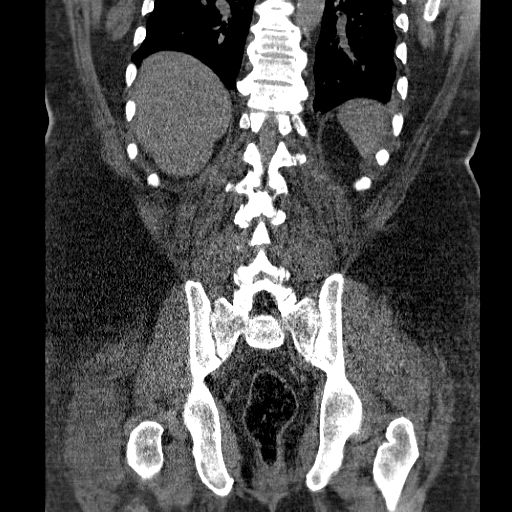

[13 of 46 positions shown; findings below may reference images not displayed]

FINDINGS: Note: Given body habitus, the entire anterior abdominal wall
(including a portion of the abnormality) could not be included in
the field of view despite two separate attempts.

Prior ventral hernia mesh repair. Just anterior to the mesh, within
the anterior abdominal wall, is an abdominal wall fluid
collection/abscess measuring approximately 14.5 x 18.1 x 26.1 cm
(incompletely visualized). Along the superior aspect of the fluid
collection, gas extends to immediately beneath the skin surface
(series 10/image 27). Bowel loops are displaced posteriorly by the
large collection fluid without definite communication.

A small hernia containing fat and a loop of nondilated colon is
present inferiorly (sagittal image 88), to the left of midline
(series 10/image 81 and 86).

Minimal left basilar atelectasis.

Cardiomegaly. Coronary atherosclerosis. ICD leads, incompletely
visualized.

Moderate hepatic steatosis.

Unenhanced spleen and pancreas are unremarkable.

Low-density nodular thickening of the bilateral adrenal glands,
likely reflecting adrenal adenomas.

Gallbladder is notable for layering gallstone. No intrahepatic or
extrahepatic ductal dilatation.

Kidneys are unremarkable. No renal calculi or hydronephrosis.

No evidence of bowel obstruction. Prior appendectomy.

Atherosclerotic calcifications of the abdominal aorta and branch
vessels.

No abdominopelvic ascites.

No suspicious abdominopelvic lymphadenopathy.

Prostate is unremarkable.

Bladder is within normal limits.

Degenerative changes of the visualized thoracolumbar spine.
IMPRESSION: 14.5 x 18.1 x 26.1 cm fluid collection/abscess within the anterior
abdominal wall, as described above, incompletely visualized. The
collection appears just anterior to prior ventral hernia mesh.

Given the size and proximity to the skin surface, incision and
drainage is suggested.

These results were called by telephone at the time of interpretation
on 08/14/2013 at [DATE] to Dr. FERRUCCIO HOWLADER , who verbally
acknowledged these results.

## 2014-10-29 IMAGING — CR DG CHEST 1V PORT
1 series · 1 of 1 positions shown · non-contrast
Comparison: Prior radiograph from 08/13/2013

CLINICAL DATA: Sepsis, respiratory distress

EXAM:
PORTABLE CHEST - 1 VIEW

[AP]
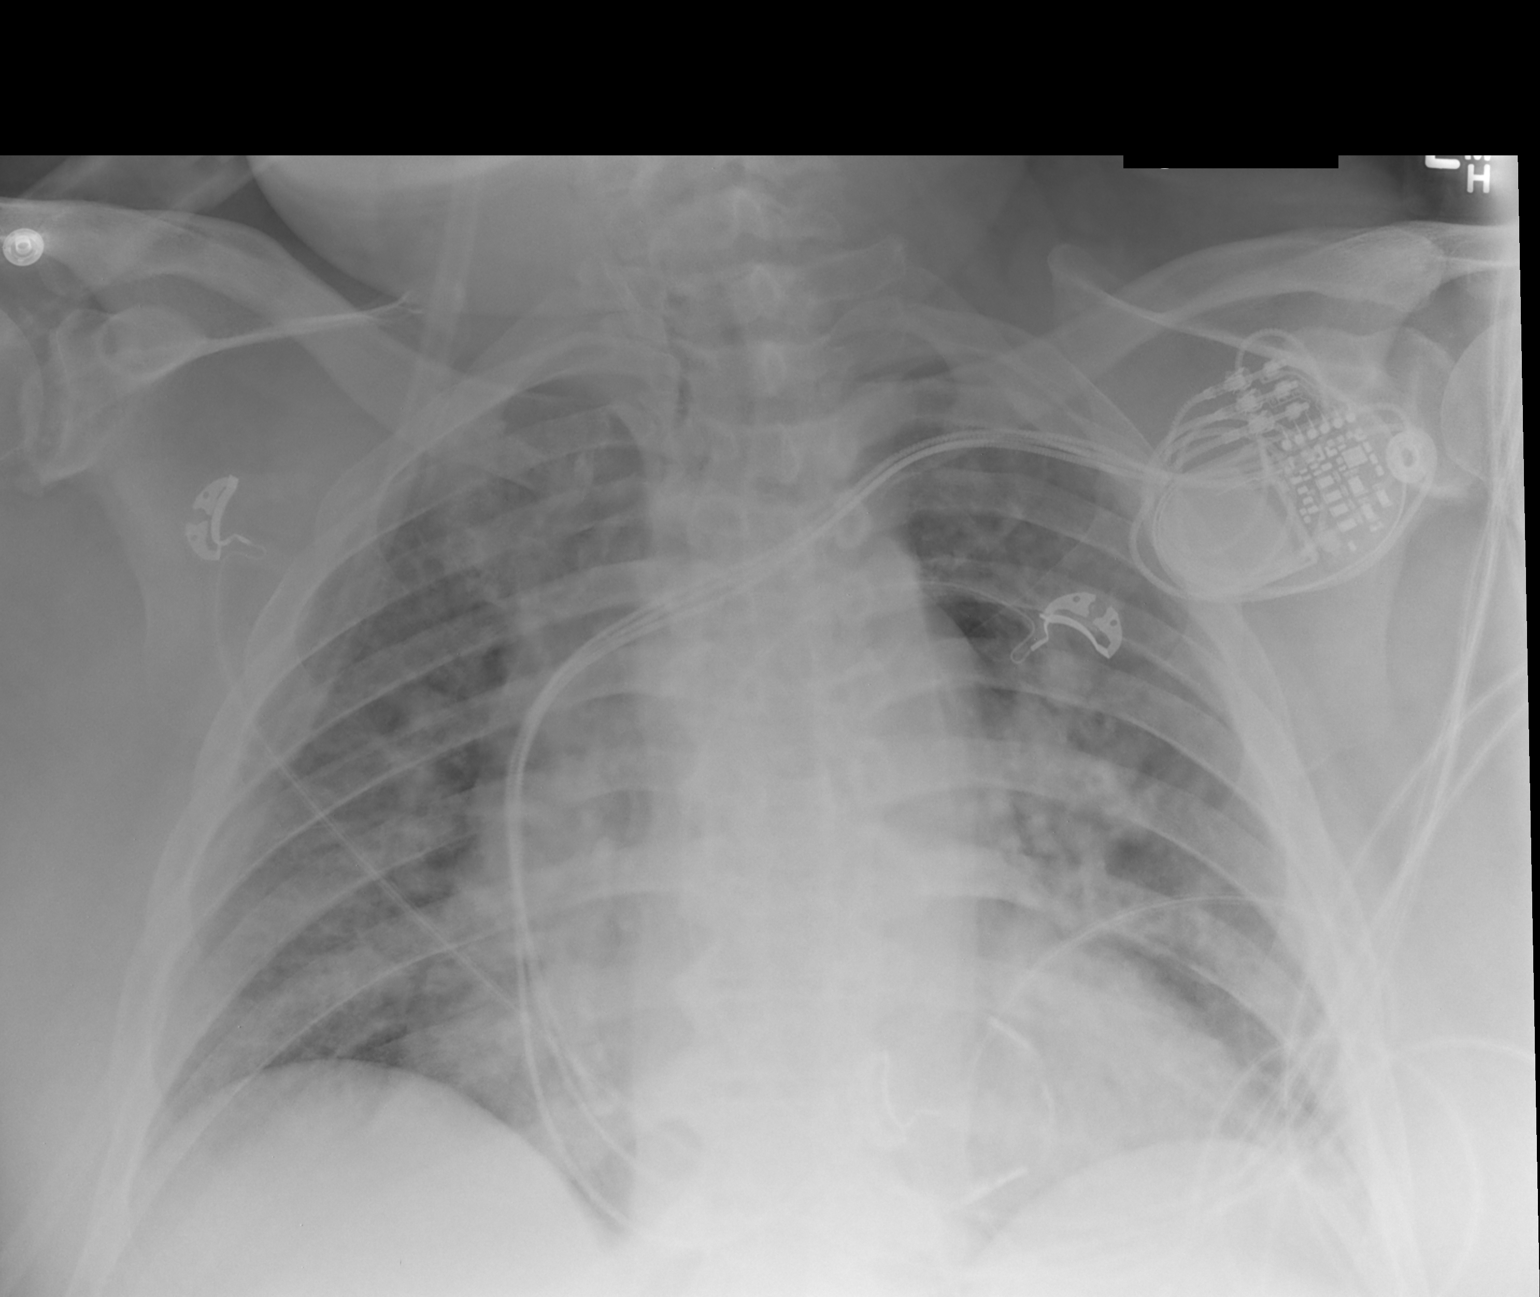

[1 of 1 positions shown; findings below may reference images not displayed]

FINDINGS: Left-sided pacemaker is unchanged. Cardiomegaly is stable as
compared to the prior exam.

Lungs are normally inflated. There has been worsening of pulmonary
vascular congestion and edema as compared to the prior examination.
No definite pleural effusion. No focal infiltrates appreciated. No
pneumothorax.

Osseous structures are unchanged.
IMPRESSION: Stable cardiomegaly with interval worsening of pulmonary vascular
congestion/ edema relative to 08/13/2013.

## 2014-11-04 DIAGNOSIS — E118 Type 2 diabetes mellitus with unspecified complications: Secondary | ICD-10-CM | POA: Diagnosis not present

## 2014-11-04 DIAGNOSIS — I10 Essential (primary) hypertension: Secondary | ICD-10-CM | POA: Diagnosis not present

## 2014-11-04 DIAGNOSIS — E780 Pure hypercholesterolemia: Secondary | ICD-10-CM | POA: Diagnosis not present

## 2014-11-17 DIAGNOSIS — I509 Heart failure, unspecified: Secondary | ICD-10-CM | POA: Diagnosis not present

## 2014-11-21 DIAGNOSIS — I509 Heart failure, unspecified: Secondary | ICD-10-CM | POA: Diagnosis not present

## 2014-12-15 ENCOUNTER — Ambulatory Visit: Payer: Medicare Other | Admitting: Cardiology

## 2014-12-16 DIAGNOSIS — I509 Heart failure, unspecified: Secondary | ICD-10-CM | POA: Diagnosis not present

## 2014-12-20 ENCOUNTER — Encounter: Payer: Self-pay | Admitting: Cardiology

## 2014-12-20 DIAGNOSIS — I509 Heart failure, unspecified: Secondary | ICD-10-CM | POA: Diagnosis not present

## 2015-01-16 DIAGNOSIS — I509 Heart failure, unspecified: Secondary | ICD-10-CM | POA: Diagnosis not present

## 2015-01-20 DIAGNOSIS — I509 Heart failure, unspecified: Secondary | ICD-10-CM | POA: Diagnosis not present

## 2015-01-31 DIAGNOSIS — I10 Essential (primary) hypertension: Secondary | ICD-10-CM | POA: Diagnosis not present

## 2015-01-31 DIAGNOSIS — E119 Type 2 diabetes mellitus without complications: Secondary | ICD-10-CM | POA: Diagnosis not present

## 2015-02-03 DIAGNOSIS — E780 Pure hypercholesterolemia: Secondary | ICD-10-CM | POA: Diagnosis not present

## 2015-02-03 DIAGNOSIS — E119 Type 2 diabetes mellitus without complications: Secondary | ICD-10-CM | POA: Diagnosis not present

## 2015-02-03 DIAGNOSIS — I10 Essential (primary) hypertension: Secondary | ICD-10-CM | POA: Diagnosis not present

## 2015-02-15 DIAGNOSIS — I509 Heart failure, unspecified: Secondary | ICD-10-CM | POA: Diagnosis not present

## 2015-02-19 DIAGNOSIS — I509 Heart failure, unspecified: Secondary | ICD-10-CM | POA: Diagnosis not present

## 2015-03-18 DIAGNOSIS — I509 Heart failure, unspecified: Secondary | ICD-10-CM | POA: Diagnosis not present

## 2015-03-22 DIAGNOSIS — I509 Heart failure, unspecified: Secondary | ICD-10-CM | POA: Diagnosis not present

## 2015-04-21 DIAGNOSIS — I509 Heart failure, unspecified: Secondary | ICD-10-CM | POA: Diagnosis not present

## 2015-05-22 DIAGNOSIS — I509 Heart failure, unspecified: Secondary | ICD-10-CM | POA: Diagnosis not present

## 2015-05-23 DIAGNOSIS — E119 Type 2 diabetes mellitus without complications: Secondary | ICD-10-CM | POA: Diagnosis not present

## 2015-05-23 DIAGNOSIS — E780 Pure hypercholesterolemia: Secondary | ICD-10-CM | POA: Diagnosis not present

## 2015-05-23 DIAGNOSIS — I10 Essential (primary) hypertension: Secondary | ICD-10-CM | POA: Diagnosis not present

## 2015-05-23 DIAGNOSIS — E669 Obesity, unspecified: Secondary | ICD-10-CM | POA: Diagnosis not present

## 2015-07-04 DIAGNOSIS — E669 Obesity, unspecified: Secondary | ICD-10-CM | POA: Diagnosis not present

## 2015-07-04 DIAGNOSIS — I10 Essential (primary) hypertension: Secondary | ICD-10-CM | POA: Diagnosis not present

## 2015-07-04 DIAGNOSIS — N390 Urinary tract infection, site not specified: Secondary | ICD-10-CM | POA: Diagnosis not present

## 2015-07-04 DIAGNOSIS — E119 Type 2 diabetes mellitus without complications: Secondary | ICD-10-CM | POA: Diagnosis not present

## 2015-07-07 DIAGNOSIS — E119 Type 2 diabetes mellitus without complications: Secondary | ICD-10-CM | POA: Diagnosis not present

## 2015-07-07 DIAGNOSIS — E780 Pure hypercholesterolemia: Secondary | ICD-10-CM | POA: Diagnosis not present

## 2015-07-07 DIAGNOSIS — I10 Essential (primary) hypertension: Secondary | ICD-10-CM | POA: Diagnosis not present

## 2015-07-11 DIAGNOSIS — Z23 Encounter for immunization: Secondary | ICD-10-CM | POA: Diagnosis not present

## 2015-08-17 DIAGNOSIS — Z6841 Body Mass Index (BMI) 40.0 and over, adult: Secondary | ICD-10-CM | POA: Diagnosis not present

## 2015-08-17 DIAGNOSIS — I110 Hypertensive heart disease with heart failure: Secondary | ICD-10-CM | POA: Diagnosis not present

## 2015-08-17 DIAGNOSIS — E538 Deficiency of other specified B group vitamins: Secondary | ICD-10-CM | POA: Diagnosis not present

## 2015-08-17 DIAGNOSIS — I441 Atrioventricular block, second degree: Secondary | ICD-10-CM | POA: Diagnosis not present

## 2015-08-17 DIAGNOSIS — E1142 Type 2 diabetes mellitus with diabetic polyneuropathy: Secondary | ICD-10-CM | POA: Diagnosis not present

## 2015-08-17 DIAGNOSIS — Z87891 Personal history of nicotine dependence: Secondary | ICD-10-CM | POA: Diagnosis not present

## 2015-08-17 DIAGNOSIS — Z79891 Long term (current) use of opiate analgesic: Secondary | ICD-10-CM | POA: Diagnosis not present

## 2015-08-17 DIAGNOSIS — E119 Type 2 diabetes mellitus without complications: Secondary | ICD-10-CM | POA: Diagnosis not present

## 2015-08-17 DIAGNOSIS — H35039 Hypertensive retinopathy, unspecified eye: Secondary | ICD-10-CM | POA: Diagnosis not present

## 2015-08-17 DIAGNOSIS — I5032 Chronic diastolic (congestive) heart failure: Secondary | ICD-10-CM | POA: Diagnosis not present

## 2015-08-17 DIAGNOSIS — R2681 Unsteadiness on feet: Secondary | ICD-10-CM | POA: Diagnosis not present

## 2015-08-21 DIAGNOSIS — I110 Hypertensive heart disease with heart failure: Secondary | ICD-10-CM | POA: Diagnosis not present

## 2015-08-21 DIAGNOSIS — I5032 Chronic diastolic (congestive) heart failure: Secondary | ICD-10-CM | POA: Diagnosis not present

## 2015-08-21 DIAGNOSIS — E1142 Type 2 diabetes mellitus with diabetic polyneuropathy: Secondary | ICD-10-CM | POA: Diagnosis not present

## 2015-08-21 DIAGNOSIS — Z79891 Long term (current) use of opiate analgesic: Secondary | ICD-10-CM | POA: Diagnosis not present

## 2015-08-21 DIAGNOSIS — E538 Deficiency of other specified B group vitamins: Secondary | ICD-10-CM | POA: Diagnosis not present

## 2015-08-21 DIAGNOSIS — Z87891 Personal history of nicotine dependence: Secondary | ICD-10-CM | POA: Diagnosis not present

## 2015-08-21 DIAGNOSIS — H35039 Hypertensive retinopathy, unspecified eye: Secondary | ICD-10-CM | POA: Diagnosis not present

## 2015-08-21 DIAGNOSIS — Z6841 Body Mass Index (BMI) 40.0 and over, adult: Secondary | ICD-10-CM | POA: Diagnosis not present

## 2015-08-21 DIAGNOSIS — I441 Atrioventricular block, second degree: Secondary | ICD-10-CM | POA: Diagnosis not present

## 2015-08-21 DIAGNOSIS — R2681 Unsteadiness on feet: Secondary | ICD-10-CM | POA: Diagnosis not present

## 2015-08-23 DIAGNOSIS — I110 Hypertensive heart disease with heart failure: Secondary | ICD-10-CM | POA: Diagnosis not present

## 2015-08-23 DIAGNOSIS — R2681 Unsteadiness on feet: Secondary | ICD-10-CM | POA: Diagnosis not present

## 2015-08-23 DIAGNOSIS — H35039 Hypertensive retinopathy, unspecified eye: Secondary | ICD-10-CM | POA: Diagnosis not present

## 2015-08-23 DIAGNOSIS — Z87891 Personal history of nicotine dependence: Secondary | ICD-10-CM | POA: Diagnosis not present

## 2015-08-23 DIAGNOSIS — E538 Deficiency of other specified B group vitamins: Secondary | ICD-10-CM | POA: Diagnosis not present

## 2015-08-23 DIAGNOSIS — I5032 Chronic diastolic (congestive) heart failure: Secondary | ICD-10-CM | POA: Diagnosis not present

## 2015-08-23 DIAGNOSIS — Z6841 Body Mass Index (BMI) 40.0 and over, adult: Secondary | ICD-10-CM | POA: Diagnosis not present

## 2015-08-23 DIAGNOSIS — Z79891 Long term (current) use of opiate analgesic: Secondary | ICD-10-CM | POA: Diagnosis not present

## 2015-08-23 DIAGNOSIS — I441 Atrioventricular block, second degree: Secondary | ICD-10-CM | POA: Diagnosis not present

## 2015-08-23 DIAGNOSIS — E1142 Type 2 diabetes mellitus with diabetic polyneuropathy: Secondary | ICD-10-CM | POA: Diagnosis not present

## 2015-08-25 DIAGNOSIS — R0602 Shortness of breath: Secondary | ICD-10-CM | POA: Diagnosis not present

## 2015-08-25 DIAGNOSIS — K632 Fistula of intestine: Secondary | ICD-10-CM | POA: Diagnosis not present

## 2015-08-25 DIAGNOSIS — R269 Unspecified abnormalities of gait and mobility: Secondary | ICD-10-CM | POA: Diagnosis not present

## 2015-08-25 DIAGNOSIS — E440 Moderate protein-calorie malnutrition: Secondary | ICD-10-CM | POA: Diagnosis not present

## 2015-08-25 DIAGNOSIS — I509 Heart failure, unspecified: Secondary | ICD-10-CM | POA: Diagnosis not present

## 2015-08-28 DIAGNOSIS — Z79891 Long term (current) use of opiate analgesic: Secondary | ICD-10-CM | POA: Diagnosis not present

## 2015-08-28 DIAGNOSIS — R2681 Unsteadiness on feet: Secondary | ICD-10-CM | POA: Diagnosis not present

## 2015-08-28 DIAGNOSIS — E538 Deficiency of other specified B group vitamins: Secondary | ICD-10-CM | POA: Diagnosis not present

## 2015-08-28 DIAGNOSIS — E1142 Type 2 diabetes mellitus with diabetic polyneuropathy: Secondary | ICD-10-CM | POA: Diagnosis not present

## 2015-08-28 DIAGNOSIS — I5032 Chronic diastolic (congestive) heart failure: Secondary | ICD-10-CM | POA: Diagnosis not present

## 2015-08-28 DIAGNOSIS — I110 Hypertensive heart disease with heart failure: Secondary | ICD-10-CM | POA: Diagnosis not present

## 2015-08-28 DIAGNOSIS — Z6841 Body Mass Index (BMI) 40.0 and over, adult: Secondary | ICD-10-CM | POA: Diagnosis not present

## 2015-08-28 DIAGNOSIS — Z87891 Personal history of nicotine dependence: Secondary | ICD-10-CM | POA: Diagnosis not present

## 2015-08-28 DIAGNOSIS — I441 Atrioventricular block, second degree: Secondary | ICD-10-CM | POA: Diagnosis not present

## 2015-08-28 DIAGNOSIS — H35039 Hypertensive retinopathy, unspecified eye: Secondary | ICD-10-CM | POA: Diagnosis not present

## 2015-08-29 DIAGNOSIS — I5032 Chronic diastolic (congestive) heart failure: Secondary | ICD-10-CM | POA: Diagnosis not present

## 2015-08-29 DIAGNOSIS — Z79891 Long term (current) use of opiate analgesic: Secondary | ICD-10-CM | POA: Diagnosis not present

## 2015-08-29 DIAGNOSIS — I110 Hypertensive heart disease with heart failure: Secondary | ICD-10-CM | POA: Diagnosis not present

## 2015-08-29 DIAGNOSIS — E538 Deficiency of other specified B group vitamins: Secondary | ICD-10-CM | POA: Diagnosis not present

## 2015-08-29 DIAGNOSIS — I441 Atrioventricular block, second degree: Secondary | ICD-10-CM | POA: Diagnosis not present

## 2015-08-29 DIAGNOSIS — R2681 Unsteadiness on feet: Secondary | ICD-10-CM | POA: Diagnosis not present

## 2015-08-29 DIAGNOSIS — Z6841 Body Mass Index (BMI) 40.0 and over, adult: Secondary | ICD-10-CM | POA: Diagnosis not present

## 2015-08-29 DIAGNOSIS — Z87891 Personal history of nicotine dependence: Secondary | ICD-10-CM | POA: Diagnosis not present

## 2015-08-29 DIAGNOSIS — E1142 Type 2 diabetes mellitus with diabetic polyneuropathy: Secondary | ICD-10-CM | POA: Diagnosis not present

## 2015-08-29 DIAGNOSIS — H35039 Hypertensive retinopathy, unspecified eye: Secondary | ICD-10-CM | POA: Diagnosis not present

## 2015-08-30 DIAGNOSIS — Z79891 Long term (current) use of opiate analgesic: Secondary | ICD-10-CM | POA: Diagnosis not present

## 2015-08-30 DIAGNOSIS — I441 Atrioventricular block, second degree: Secondary | ICD-10-CM | POA: Diagnosis not present

## 2015-08-30 DIAGNOSIS — E538 Deficiency of other specified B group vitamins: Secondary | ICD-10-CM | POA: Diagnosis not present

## 2015-08-30 DIAGNOSIS — Z6841 Body Mass Index (BMI) 40.0 and over, adult: Secondary | ICD-10-CM | POA: Diagnosis not present

## 2015-08-30 DIAGNOSIS — H35039 Hypertensive retinopathy, unspecified eye: Secondary | ICD-10-CM | POA: Diagnosis not present

## 2015-08-30 DIAGNOSIS — R2681 Unsteadiness on feet: Secondary | ICD-10-CM | POA: Diagnosis not present

## 2015-08-30 DIAGNOSIS — E1142 Type 2 diabetes mellitus with diabetic polyneuropathy: Secondary | ICD-10-CM | POA: Diagnosis not present

## 2015-08-30 DIAGNOSIS — I5032 Chronic diastolic (congestive) heart failure: Secondary | ICD-10-CM | POA: Diagnosis not present

## 2015-08-30 DIAGNOSIS — I110 Hypertensive heart disease with heart failure: Secondary | ICD-10-CM | POA: Diagnosis not present

## 2015-08-30 DIAGNOSIS — Z87891 Personal history of nicotine dependence: Secondary | ICD-10-CM | POA: Diagnosis not present

## 2015-09-05 DIAGNOSIS — I110 Hypertensive heart disease with heart failure: Secondary | ICD-10-CM | POA: Diagnosis not present

## 2015-09-05 DIAGNOSIS — Z6841 Body Mass Index (BMI) 40.0 and over, adult: Secondary | ICD-10-CM | POA: Diagnosis not present

## 2015-09-05 DIAGNOSIS — I441 Atrioventricular block, second degree: Secondary | ICD-10-CM | POA: Diagnosis not present

## 2015-09-05 DIAGNOSIS — Z87891 Personal history of nicotine dependence: Secondary | ICD-10-CM | POA: Diagnosis not present

## 2015-09-05 DIAGNOSIS — I5032 Chronic diastolic (congestive) heart failure: Secondary | ICD-10-CM | POA: Diagnosis not present

## 2015-09-05 DIAGNOSIS — Z79891 Long term (current) use of opiate analgesic: Secondary | ICD-10-CM | POA: Diagnosis not present

## 2015-09-05 DIAGNOSIS — R2681 Unsteadiness on feet: Secondary | ICD-10-CM | POA: Diagnosis not present

## 2015-09-05 DIAGNOSIS — H35039 Hypertensive retinopathy, unspecified eye: Secondary | ICD-10-CM | POA: Diagnosis not present

## 2015-09-05 DIAGNOSIS — E538 Deficiency of other specified B group vitamins: Secondary | ICD-10-CM | POA: Diagnosis not present

## 2015-09-05 DIAGNOSIS — E1142 Type 2 diabetes mellitus with diabetic polyneuropathy: Secondary | ICD-10-CM | POA: Diagnosis not present

## 2015-09-11 DIAGNOSIS — E1142 Type 2 diabetes mellitus with diabetic polyneuropathy: Secondary | ICD-10-CM | POA: Diagnosis not present

## 2015-09-11 DIAGNOSIS — E538 Deficiency of other specified B group vitamins: Secondary | ICD-10-CM | POA: Diagnosis not present

## 2015-09-11 DIAGNOSIS — I5032 Chronic diastolic (congestive) heart failure: Secondary | ICD-10-CM | POA: Diagnosis not present

## 2015-09-11 DIAGNOSIS — Z79891 Long term (current) use of opiate analgesic: Secondary | ICD-10-CM | POA: Diagnosis not present

## 2015-09-11 DIAGNOSIS — R2681 Unsteadiness on feet: Secondary | ICD-10-CM | POA: Diagnosis not present

## 2015-09-11 DIAGNOSIS — Z87891 Personal history of nicotine dependence: Secondary | ICD-10-CM | POA: Diagnosis not present

## 2015-09-11 DIAGNOSIS — H35039 Hypertensive retinopathy, unspecified eye: Secondary | ICD-10-CM | POA: Diagnosis not present

## 2015-09-11 DIAGNOSIS — I441 Atrioventricular block, second degree: Secondary | ICD-10-CM | POA: Diagnosis not present

## 2015-09-11 DIAGNOSIS — I110 Hypertensive heart disease with heart failure: Secondary | ICD-10-CM | POA: Diagnosis not present

## 2015-09-11 DIAGNOSIS — Z6841 Body Mass Index (BMI) 40.0 and over, adult: Secondary | ICD-10-CM | POA: Diagnosis not present

## 2015-09-15 DIAGNOSIS — Z6841 Body Mass Index (BMI) 40.0 and over, adult: Secondary | ICD-10-CM | POA: Diagnosis not present

## 2015-09-15 DIAGNOSIS — E538 Deficiency of other specified B group vitamins: Secondary | ICD-10-CM | POA: Diagnosis not present

## 2015-09-15 DIAGNOSIS — I441 Atrioventricular block, second degree: Secondary | ICD-10-CM | POA: Diagnosis not present

## 2015-09-15 DIAGNOSIS — Z79891 Long term (current) use of opiate analgesic: Secondary | ICD-10-CM | POA: Diagnosis not present

## 2015-09-15 DIAGNOSIS — I110 Hypertensive heart disease with heart failure: Secondary | ICD-10-CM | POA: Diagnosis not present

## 2015-09-15 DIAGNOSIS — E1142 Type 2 diabetes mellitus with diabetic polyneuropathy: Secondary | ICD-10-CM | POA: Diagnosis not present

## 2015-09-15 DIAGNOSIS — Z87891 Personal history of nicotine dependence: Secondary | ICD-10-CM | POA: Diagnosis not present

## 2015-09-15 DIAGNOSIS — R2681 Unsteadiness on feet: Secondary | ICD-10-CM | POA: Diagnosis not present

## 2015-09-15 DIAGNOSIS — H35039 Hypertensive retinopathy, unspecified eye: Secondary | ICD-10-CM | POA: Diagnosis not present

## 2015-09-15 DIAGNOSIS — I5032 Chronic diastolic (congestive) heart failure: Secondary | ICD-10-CM | POA: Diagnosis not present

## 2015-10-17 ENCOUNTER — Encounter (HOSPITAL_COMMUNITY): Payer: Self-pay

## 2015-10-17 ENCOUNTER — Emergency Department (HOSPITAL_COMMUNITY)
Admission: EM | Admit: 2015-10-17 | Discharge: 2015-10-17 | Disposition: A | Discharge status: Home / Self Care | Payer: Medicare Other | Attending: Emergency Medicine | Admitting: Emergency Medicine

## 2015-10-17 DIAGNOSIS — Z794 Long term (current) use of insulin: Secondary | ICD-10-CM | POA: Insufficient documentation

## 2015-10-17 DIAGNOSIS — I10 Essential (primary) hypertension: Secondary | ICD-10-CM | POA: Insufficient documentation

## 2015-10-17 DIAGNOSIS — Z8719 Personal history of other diseases of the digestive system: Secondary | ICD-10-CM | POA: Diagnosis not present

## 2015-10-17 DIAGNOSIS — I509 Heart failure, unspecified: Secondary | ICD-10-CM | POA: Insufficient documentation

## 2015-10-17 DIAGNOSIS — E162 Hypoglycemia, unspecified: Secondary | ICD-10-CM

## 2015-10-17 DIAGNOSIS — E119 Type 2 diabetes mellitus without complications: Secondary | ICD-10-CM | POA: Diagnosis not present

## 2015-10-17 DIAGNOSIS — Z79899 Other long term (current) drug therapy: Secondary | ICD-10-CM | POA: Insufficient documentation

## 2015-10-17 DIAGNOSIS — Z8669 Personal history of other diseases of the nervous system and sense organs: Secondary | ICD-10-CM | POA: Insufficient documentation

## 2015-10-17 DIAGNOSIS — Z9889 Other specified postprocedural states: Secondary | ICD-10-CM | POA: Diagnosis not present

## 2015-10-17 DIAGNOSIS — E785 Hyperlipidemia, unspecified: Secondary | ICD-10-CM | POA: Insufficient documentation

## 2015-10-17 DIAGNOSIS — E11649 Type 2 diabetes mellitus with hypoglycemia without coma: Secondary | ICD-10-CM | POA: Insufficient documentation

## 2015-10-17 DIAGNOSIS — Z87891 Personal history of nicotine dependence: Secondary | ICD-10-CM | POA: Insufficient documentation

## 2015-10-17 LAB — BASIC METABOLIC PANEL
Anion gap: 9 (ref 5–15)
BUN: 24 mg/dL — AB (ref 6–20)
CO2: 32 mmol/L (ref 22–32)
Calcium: 8.8 mg/dL — ABNORMAL LOW (ref 8.9–10.3)
Chloride: 101 mmol/L (ref 101–111)
Creatinine, Ser: 1.71 mg/dL — ABNORMAL HIGH (ref 0.61–1.24)
GFR calc Af Amer: 48 mL/min — ABNORMAL LOW (ref >60)
GFR calc non Af Amer: 41 mL/min — ABNORMAL LOW (ref >60)
GLUCOSE: 93 mg/dL (ref 65–99)
Potassium: 4 mmol/L (ref 3.5–5.1)
Sodium: 142 mmol/L (ref 135–145)

## 2015-10-17 LAB — CBG MONITORING, ED
GLUCOSE-CAPILLARY: 68 mg/dL (ref 65–99)
Glucose-Capillary: 83 mg/dL (ref 65–99)

## 2015-10-17 NOTE — ED Notes (Signed)
Patient was alert, oriented and stable upon discharge. RN went over AVS and patient had no further questions.  

## 2015-10-17 NOTE — ED Provider Notes (Signed)
CSN: [id number]     Arrival date & time 10/17/15  1241 History   First MD Initiated Contact with Patient 10/17/15 1539     Chief Complaint  Patient presents with  . Hypoglycemia     (Consider location/radiation/quality/duration/timing/severity/associated sxs/prior Treatment) HPI  65 year old male who presents with hypoglycemia. History of DM-2.  Recent change to humalog at same dose but gave himself insulin without eating this morning. Found to be confused, agitated and lethgargic by wife, poct glucose at home 36, gave some juice and called EMS by wife.  EMS arrived and patient with persistent hypoglycemia. Given IM glucagon and 25 g of D50. Had normalization of mental status subsequently. No recent illness. Eating and drinking well at home.   Past Medical History  Diagnosis Date  . Sleep apnea   . Diabetes mellitus, type 2 ([place])   . Hypertension   . Hyperlipidemia   . CHF (congestive heart failure), NYHA class II ([place])   . Hernia of abdominal cavity   . AAA (abdominal aortic aneurysm)/ 4.5 cm ascending per CT angio 08/01/13 08/12/2013  . 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 Constipation, chronic 08/03/2013  . Benign hypertension   . Hypokalemia   . OSA (obstructive sleep apnea)   . Neuropathy ([place])   . Hx of Mobitz type II block     s/p PPM Bi-Ven   Past Surgical History  Procedure Laterality Date  . Appendectomy    . Tonsillectomy and adenoidectomy    . Nasal septum surgery    . Bowel resection  1980s?    ?colectomy/ostomy - Dr Elesa Hacker  . Ostomy take down  1980s?    Dr Elesa Hacker  . Incisional hernia repair  1980s?    Dr. Lindon Romp - w mesh  . Knee surgery Right   . Incision and drainage abscess N/A 08/14/2013    Procedure: INCISION AND DRAINAGE ABSCESS, mesh ;  Surgeon: [name], MD;  Location: [facility];  Service: General;  Laterality: N/A;  . Left heart catheterization with coronary angiogram N/A 03/17/2014     Procedure: LEFT HEART CATHETERIZATION WITH CORONARY ANGIOGRAM;  Surgeon: [name], MD;  Location: [facility] CATH LAB;  Service: Cardiovascular;  Laterality: N/A;   Family History  Problem Relation Age of Onset  . Diabetes Mother   . Alcoholism Father   . Heart Problems Maternal Grandmother    Social History  Substance Use Topics  . Smoking status: Former Smoker -- 1.00 packs/day for 40 years    Types: Cigarettes    Quit date: 08/21/2012  . Smokeless tobacco: Never Used  . Alcohol Use: 0.0 oz/week    0 Standard drinks or equivalent per week     Comment: occasional    Review of Systems 10/14 systems reviewed and are negative other than those stated in the HPI    Allergies  Review of patient's allergies indicates no known allergies.  Home Medications   Prior to Admission medications   Medication Sig Start Date End Date Taking? Authorizing Provider  carvedilol (COREG) 25 MG tablet Take 25 mg by mouth 2 (two) times daily. 07/25/14  Yes Historical Provider, MD  furosemide (LASIX) 80 MG tablet Take 80 mg by mouth 2 (two) times daily.    Yes Historical Provider, MD  insulin lispro (HUMALOG) 100 UNIT/ML injection Inject 20 Units into the skin 2 (two) times daily.   Yes Historical Provider, MD  insulin NPH Human (  HUMULIN N,NOVOLIN N) 100 UNIT/ML injection Inject 20 Units into the skin 2 (two) times daily before a meal.    Yes Historical Provider, MD  oxyCODONE-acetaminophen (PERCOCET) 10-325 MG per tablet Take 1 tablet by mouth every 6 (six) hours as needed for pain.  11/04/13  Yes Historical Provider, MD  ramipril (ALTACE) 5 MG capsule Take 5 mg by mouth daily. 07/25/14  Yes Historical Provider, MD  rosuvastatin (CRESTOR) 10 MG tablet Take 10 mg by mouth daily.   Yes Historical Provider, MD  spironolactone (ALDACTONE) 25 MG tablet Take 25 mg by mouth 2 (two) times daily. 07/25/14  Yes Historical Provider, MD  ACCU-CHEK SMARTVIEW test strip  08/17/14   Historical Provider, MD   BP  158/84 mmHg  Pulse 50  Temp(Src)   Resp 20  SpO2 97% Physical Exam Physical Exam  Nursing note and vitals reviewed. Constitutional: Well developed, well nourished, non-toxic, and in no acute distress Head: Normocephalic and atraumatic.  Mouth/Throat: Oropharynx is clear and moist.  Neck: Normal range of motion. Neck supple.  Cardiovascular: Normal rate and regular rhythm.   Pulmonary/Chest: Effort normal and breath sounds normal.  Abdominal: Soft. There is no tenderness. There is no rebound and no guarding.  Musculoskeletal: Normal range of motion.  Neurological: Alert, no facial droop, fluent speech, moves all extremities symmetrically Skin: Skin is warm and dry.  Psychiatric: Cooperative  ED Course  Procedures (including critical care time) Labs Review Labs Reviewed  BASIC METABOLIC PANEL - Abnormal; Notable for the following:    BUN 24 (*)    Creatinine, Ser 1.71 (*)    Calcium 8.8 (*)    GFR calc non Af Amer 41 (*)    GFR calc Af Amer 48 (*)    All other components within normal limits  CBG MONITORING, ED  CBG MONITORING, ED    Imaging Review No results found. I have personally reviewed and evaluated these images and lab results as part of my medical decision-making.   EKG Interpretation None      MDM   Final diagnoses:  Hypoglycemia   65 year old male with history of DM-2 who presents with hypoglycemia. Due to not eating after administering himself short acting insulin. Is normogylcemic here and maintains normoglycemia. Able to eat and mentating at baseline. Mild AKI, and patient encouraged increased oral hydration at home. Will follow-up with PCP regarding this issue within one week. Strict return and follow-up instructions reviewed. He expressed understanding of all discharge instructions and felt comfortable with the plan of care.    Forde Dandy, MD 10/18/15 1438

## 2015-10-17 NOTE — Discharge Instructions (Signed)
Drink plenty of fluids and eat well. Return for recurrent confusion or low blood sugars despite eating and drinking well. Follow-up with your primary care provider  Hypoglycemia Low blood sugar (hypoglycemia) means that the level of sugar in your blood is lower than it should be. Signs of low blood sugar include:  Getting sweaty.  Feeling hungry.  Feeling dizzy or weak.  Feeling sleepier than normal.  Feeling nervous.  Headaches.  Having a fast heartbeat. Low blood sugar can happen fast and can be an emergency. Your doctor can do tests to check your blood sugar level. You can have low blood sugar and not have diabetes. HOME CARE  Check your blood sugar as told by your doctor. If it is less than 70 mg/dl or as told by your doctor, take 1 of the following:  3 to 4 glucose tablets.   cup clear juice.   cup soda pop, not diet.  1 cup milk.  5 to 6 hard candies.  Recheck blood sugar after 15 minutes. Repeat until it is at the right level.  Eat a snack if it is more than 1 hour until the next meal.  Only take medicine as told by your doctor.  Do not skip meals. Eat on time.  Do not drink alcohol except with meals.  Check your blood glucose before driving.  Check your blood glucose before and after exercise.  Always carry treatment with you, such as glucose pills.  Always wear a medical alert bracelet if you have diabetes. GET HELP RIGHT AWAY IF:   Your blood glucose goes below 70 mg/dl or as told by your doctor, and you:  Are confused.  Are not able to swallow.  Pass out (faint).  You cannot treat yourself. You may need someone to help you.  You have low blood sugar problems often.  You have problems from your medicines.  You are not feeling better after 3 to 4 days.  You have vision changes. MAKE SURE YOU:   Understand these instructions.  Will watch this condition.  Will get help right away if you are not doing well or get worse.   This  information is not intended to replace advice given to you by your health care provider. Make sure you discuss any questions you have with your health care provider.   Document Released: 12/18/2009 Document Revised: 10/14/2014 Document Reviewed: 05/30/2015 Elsevier Interactive Patient Education [employer].

## 2015-10-17 NOTE — ED Notes (Signed)
Per EMS, pt from home.  Pt found lethargic and combative.  Family stated symptoms x 45 min prior to EMS arrival.  Initial cbg 46.  Glucagon IM was given by EMS.  20 g Rt wrist.  25 g dextrose given.  No pain.  Recent change in his insulin. Did not eat prior to insulin dosing today.  CBG came up to 107.  Vitals: 144/82, hr 80 paced, 96 %, resp 18

## 2015-11-18 DIAGNOSIS — E161 Other hypoglycemia: Secondary | ICD-10-CM | POA: Diagnosis not present

## 2015-11-18 DIAGNOSIS — R7309 Other abnormal glucose: Secondary | ICD-10-CM | POA: Diagnosis not present

## 2016-01-26 ENCOUNTER — Emergency Department (HOSPITAL_COMMUNITY): Payer: Medicare Other

## 2016-01-26 ENCOUNTER — Inpatient Hospital Stay (HOSPITAL_COMMUNITY)
Admission: EM | Admit: 2016-01-26 | Discharge: 2016-02-06 | DRG: 189 | Disposition: A | Discharge status: Skilled Nursing Facility | Payer: Medicare Other | Attending: Internal Medicine | Admitting: Internal Medicine

## 2016-01-26 ENCOUNTER — Encounter (HOSPITAL_COMMUNITY): Payer: Self-pay | Admitting: Emergency Medicine

## 2016-01-26 DIAGNOSIS — R05 Cough: Secondary | ICD-10-CM | POA: Diagnosis not present

## 2016-01-26 DIAGNOSIS — Z833 Family history of diabetes mellitus: Secondary | ICD-10-CM | POA: Diagnosis not present

## 2016-01-26 DIAGNOSIS — I714 Abdominal aortic aneurysm, without rupture: Secondary | ICD-10-CM | POA: Diagnosis present

## 2016-01-26 DIAGNOSIS — G4733 Obstructive sleep apnea (adult) (pediatric): Secondary | ICD-10-CM | POA: Diagnosis not present

## 2016-01-26 DIAGNOSIS — J9621 Acute and chronic respiratory failure with hypoxia: Principal | ICD-10-CM | POA: Diagnosis present

## 2016-01-26 DIAGNOSIS — Z9119 Patient's noncompliance with other medical treatment and regimen: Secondary | ICD-10-CM | POA: Diagnosis not present

## 2016-01-26 DIAGNOSIS — I483 Typical atrial flutter: Secondary | ICD-10-CM | POA: Insufficient documentation

## 2016-01-26 DIAGNOSIS — N183 Chronic kidney disease, stage 3 (moderate): Secondary | ICD-10-CM | POA: Diagnosis not present

## 2016-01-26 DIAGNOSIS — Z9114 Patient's other noncompliance with medication regimen: Secondary | ICD-10-CM

## 2016-01-26 DIAGNOSIS — R7989 Other specified abnormal findings of blood chemistry: Secondary | ICD-10-CM | POA: Diagnosis not present

## 2016-01-26 DIAGNOSIS — I481 Persistent atrial fibrillation: Secondary | ICD-10-CM | POA: Diagnosis not present

## 2016-01-26 DIAGNOSIS — I482 Chronic atrial fibrillation: Secondary | ICD-10-CM | POA: Diagnosis not present

## 2016-01-26 DIAGNOSIS — Z87891 Personal history of nicotine dependence: Secondary | ICD-10-CM | POA: Diagnosis not present

## 2016-01-26 DIAGNOSIS — L0390 Cellulitis, unspecified: Secondary | ICD-10-CM | POA: Diagnosis not present

## 2016-01-26 DIAGNOSIS — I10 Essential (primary) hypertension: Secondary | ICD-10-CM | POA: Diagnosis present

## 2016-01-26 DIAGNOSIS — R001 Bradycardia, unspecified: Secondary | ICD-10-CM | POA: Diagnosis present

## 2016-01-26 DIAGNOSIS — Z79891 Long term (current) use of opiate analgesic: Secondary | ICD-10-CM

## 2016-01-26 DIAGNOSIS — J449 Chronic obstructive pulmonary disease, unspecified: Secondary | ICD-10-CM | POA: Diagnosis not present

## 2016-01-26 DIAGNOSIS — Z794 Long term (current) use of insulin: Secondary | ICD-10-CM | POA: Diagnosis not present

## 2016-01-26 DIAGNOSIS — N179 Acute kidney failure, unspecified: Secondary | ICD-10-CM | POA: Diagnosis present

## 2016-01-26 DIAGNOSIS — N19 Unspecified kidney failure: Secondary | ICD-10-CM | POA: Diagnosis not present

## 2016-01-26 DIAGNOSIS — R778 Other specified abnormalities of plasma proteins: Secondary | ICD-10-CM | POA: Diagnosis present

## 2016-01-26 DIAGNOSIS — E785 Hyperlipidemia, unspecified: Secondary | ICD-10-CM | POA: Diagnosis not present

## 2016-01-26 DIAGNOSIS — Z950 Presence of cardiac pacemaker: Secondary | ICD-10-CM

## 2016-01-26 DIAGNOSIS — N189 Chronic kidney disease, unspecified: Secondary | ICD-10-CM | POA: Diagnosis not present

## 2016-01-26 DIAGNOSIS — K5909 Other constipation: Secondary | ICD-10-CM | POA: Diagnosis present

## 2016-01-26 DIAGNOSIS — R0902 Hypoxemia: Secondary | ICD-10-CM | POA: Diagnosis present

## 2016-01-26 DIAGNOSIS — E1165 Type 2 diabetes mellitus with hyperglycemia: Secondary | ICD-10-CM | POA: Diagnosis present

## 2016-01-26 DIAGNOSIS — Z9181 History of falling: Secondary | ICD-10-CM

## 2016-01-26 DIAGNOSIS — I509 Heart failure, unspecified: Secondary | ICD-10-CM | POA: Diagnosis not present

## 2016-01-26 DIAGNOSIS — I5043 Acute on chronic combined systolic (congestive) and diastolic (congestive) heart failure: Secondary | ICD-10-CM | POA: Diagnosis not present

## 2016-01-26 DIAGNOSIS — Z5320 Procedure and treatment not carried out because of patient's decision for unspecified reasons: Secondary | ICD-10-CM | POA: Diagnosis not present

## 2016-01-26 DIAGNOSIS — I248 Other forms of acute ischemic heart disease: Secondary | ICD-10-CM | POA: Diagnosis not present

## 2016-01-26 DIAGNOSIS — M7989 Other specified soft tissue disorders: Secondary | ICD-10-CM | POA: Diagnosis not present

## 2016-01-26 DIAGNOSIS — Z7901 Long term (current) use of anticoagulants: Secondary | ICD-10-CM | POA: Diagnosis not present

## 2016-01-26 DIAGNOSIS — K469 Unspecified abdominal hernia without obstruction or gangrene: Secondary | ICD-10-CM | POA: Diagnosis not present

## 2016-01-26 DIAGNOSIS — E1122 Type 2 diabetes mellitus with diabetic chronic kidney disease: Secondary | ICD-10-CM | POA: Diagnosis present

## 2016-01-26 DIAGNOSIS — I4891 Unspecified atrial fibrillation: Secondary | ICD-10-CM | POA: Diagnosis not present

## 2016-01-26 DIAGNOSIS — K912 Postsurgical malabsorption, not elsewhere classified: Secondary | ICD-10-CM | POA: Diagnosis present

## 2016-01-26 DIAGNOSIS — I130 Hypertensive heart and chronic kidney disease with heart failure and stage 1 through stage 4 chronic kidney disease, or unspecified chronic kidney disease: Secondary | ICD-10-CM | POA: Diagnosis present

## 2016-01-26 DIAGNOSIS — E662 Morbid (severe) obesity with alveolar hypoventilation: Secondary | ICD-10-CM | POA: Diagnosis present

## 2016-01-26 DIAGNOSIS — E1140 Type 2 diabetes mellitus with diabetic neuropathy, unspecified: Secondary | ICD-10-CM | POA: Diagnosis present

## 2016-01-26 DIAGNOSIS — L03116 Cellulitis of left lower limb: Secondary | ICD-10-CM | POA: Diagnosis not present

## 2016-01-26 DIAGNOSIS — E1022 Type 1 diabetes mellitus with diabetic chronic kidney disease: Secondary | ICD-10-CM | POA: Diagnosis not present

## 2016-01-26 DIAGNOSIS — E1065 Type 1 diabetes mellitus with hyperglycemia: Secondary | ICD-10-CM | POA: Diagnosis not present

## 2016-01-26 DIAGNOSIS — R059 Cough, unspecified: Secondary | ICD-10-CM

## 2016-01-26 DIAGNOSIS — Z6841 Body Mass Index (BMI) 40.0 and over, adult: Secondary | ICD-10-CM

## 2016-01-26 DIAGNOSIS — I5021 Acute systolic (congestive) heart failure: Secondary | ICD-10-CM | POA: Diagnosis not present

## 2016-01-26 DIAGNOSIS — J9620 Acute and chronic respiratory failure, unspecified whether with hypoxia or hypercapnia: Secondary | ICD-10-CM | POA: Diagnosis present

## 2016-01-26 DIAGNOSIS — J811 Chronic pulmonary edema: Secondary | ICD-10-CM | POA: Diagnosis not present

## 2016-01-26 HISTORY — DX: Unspecified atrial fibrillation: I48.91

## 2016-01-26 HISTORY — DX: Hypoxemia: R09.02

## 2016-01-26 LAB — BRAIN NATRIURETIC PEPTIDE: B Natriuretic Peptide: 258.6 pg/mL — ABNORMAL HIGH (ref 0.0–100.0)

## 2016-01-26 LAB — COMPREHENSIVE METABOLIC PANEL
ALK PHOS: 28 U/L — AB (ref 38–126)
ALT: 16 U/L — AB (ref 17–63)
AST: 26 U/L (ref 15–41)
Albumin: 2.7 g/dL — ABNORMAL LOW (ref 3.5–5.0)
Anion gap: 15 (ref 5–15)
BILIRUBIN TOTAL: 1.2 mg/dL (ref 0.3–1.2)
BUN: 25 mg/dL — ABNORMAL HIGH (ref 6–20)
CALCIUM: 9.1 mg/dL (ref 8.9–10.3)
CO2: 27 mmol/L (ref 22–32)
CREATININE: 2.42 mg/dL — AB (ref 0.61–1.24)
Chloride: 97 mmol/L — ABNORMAL LOW (ref 101–111)
GFR calc non Af Amer: 27 mL/min — ABNORMAL LOW (ref >60)
GFR, EST AFRICAN AMERICAN: 31 mL/min — AB (ref >60)
Glucose, Bld: 136 mg/dL — ABNORMAL HIGH (ref 65–99)
Potassium: 3.8 mmol/L (ref 3.5–5.1)
Sodium: 139 mmol/L (ref 135–145)
Total Protein: 7.2 g/dL (ref 6.5–8.1)

## 2016-01-26 LAB — CBC WITH DIFFERENTIAL/PLATELET
Basophils Absolute: 0 10*3/uL (ref 0.0–0.1)
Basophils Relative: 0 %
EOS ABS: 0 10*3/uL (ref 0.0–0.7)
Eosinophils Relative: 0 %
HEMATOCRIT: 49.8 % (ref 39.0–52.0)
HEMOGLOBIN: 16.9 g/dL (ref 13.0–17.0)
LYMPHS ABS: 1.4 10*3/uL (ref 0.7–4.0)
LYMPHS PCT: 11 %
MCH: 33.7 pg (ref 26.0–34.0)
MCHC: 33.9 g/dL (ref 30.0–36.0)
MCV: 99.4 fL (ref 78.0–100.0)
Monocytes Absolute: 0.6 10*3/uL (ref 0.1–1.0)
Monocytes Relative: 5 %
NEUTROS ABS: 10.2 10*3/uL — AB (ref 1.7–7.7)
NEUTROS PCT: 84 %
Platelets: 147 10*3/uL — ABNORMAL LOW (ref 150–400)
RBC: 5.01 MIL/uL (ref 4.22–5.81)
RDW: 15.1 % (ref 11.5–15.5)
WBC: 12.3 10*3/uL — AB (ref 4.0–10.5)

## 2016-01-26 LAB — I-STAT TROPONIN, ED: Troponin i, poc: 0.09 ng/mL (ref 0.00–0.08)

## 2016-01-26 LAB — TROPONIN I: TROPONIN I: 0.1 ng/mL — AB (ref <0.031)

## 2016-01-26 MED ORDER — FUROSEMIDE 10 MG/ML IJ SOLN: 80.0000 mg | Freq: Once | INTRAMUSCULAR | Status: DC

## 2016-01-26 MED ORDER — FUROSEMIDE 10 MG/ML IJ SOLN
40.0000 mg | Freq: Once | INTRAMUSCULAR | Status: DC
  Filled 2016-01-26: qty 4

## 2016-01-26 NOTE — ED Notes (Signed)
Patient attempted to use urinal for sample unable to obtain. Patient states he will try again

## 2016-01-26 NOTE — ED Provider Notes (Signed)
CSN: [id number]     Arrival date & time 01/26/16  2051 History   First MD Initiated Contact with Patient 01/26/16 2109     Chief Complaint  Patient presents with  . Cough  . Weakness  . Fatigue     (Consider location/radiation/quality/duration/timing/severity/associated sxs/prior Treatment) HPI 65 y.o. male with history of reported COPD, CHF, OSA, morbid obesity, DM, HTN, AAA, presents to the ED noting positive sick contacts at home with his wife who has pneumonia presents to the emergency department noting progressively worsening cough productive of white sputum and worsening shortness of breath. The patient is not on oxygen at home and states that he is not used his home breathing treatments because he has not been wheezing. Patient has also had associated runny nose, sore throat and similar URI symptoms as well as subjective fever and chills. He denies any associated chest pain, chest tightness, diaphoresis, nausea, vomiting, abdominal pain. On arrival to the ED the patient is tachypneic and found to be hypoxic in triage. The patient was then brought directly back to the ED for further evaluation. Additionally the patient notes generalized fatigue as a result of his symptoms and fell yesterday landing on his left knee sustaining an abrasion which is dressed from home. He has been able to ambulate on this injured lower extremity since, though he is noted significant exertional shortness of breath. The patient also notes significant worsening of his bilateral lower extremity edema.   Past Medical History  Diagnosis Date  . Sleep apnea   . Diabetes mellitus, type 2 ([place])   . Hypertension   . Hyperlipidemia   . CHF (congestive heart failure), NYHA class II ([place])   . Hernia of abdominal cavity   . AAA (abdominal aortic aneurysm)/ 4.5 cm ascending per CT angio 08/01/13 08/12/2013  . 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 Constipation,  chronic 08/03/2013  . Benign hypertension   . Hypokalemia   . OSA (obstructive sleep apnea)   . Neuropathy ([place])   . Hx of Mobitz type II block     s/p PPM Bi-Ven   Past Surgical History  Procedure Laterality Date  . Appendectomy    . Tonsillectomy and adenoidectomy    . Nasal septum surgery    . Bowel resection  1980s?    ?colectomy/ostomy - Dr Elesa Hacker  . Ostomy take down  1980s?    Dr Elesa Hacker  . Incisional hernia repair  1980s?    Dr. Lindon Romp - w mesh  . Knee surgery Right   . Incision and drainage abscess N/A 08/14/2013    Procedure: INCISION AND DRAINAGE ABSCESS, mesh ;  Surgeon: [name], MD;  Location: [facility];  Service: General;  Laterality: N/A;  . Left heart catheterization with coronary angiogram N/A 03/17/2014    Procedure: LEFT HEART CATHETERIZATION WITH CORONARY ANGIOGRAM;  Surgeon: [name], MD;  Location: [facility] CATH LAB;  Service: Cardiovascular;  Laterality: N/A;   Family History  Problem Relation Age of Onset  . Diabetes Mother   . Alcoholism Father   . Heart Problems Maternal Grandmother    Social History  Substance Use Topics  . Smoking status: Former Smoker -- 1.00 packs/day for 40 years    Types: Cigarettes    Quit date: 08/21/2012  . Smokeless tobacco: Never Used  . Alcohol Use: 0.0 oz/week    0 Standard drinks or equivalent per week     Comment: occasional  Review of Systems  Constitutional: Positive for fever, chills, activity change and fatigue.  HENT: Negative for congestion, rhinorrhea and sore throat.   Respiratory: Positive for cough and shortness of breath. Negative for chest tightness and wheezing.   Cardiovascular: Positive for leg swelling. Negative for chest pain.  Gastrointestinal: Negative for nausea, vomiting and abdominal pain.  Musculoskeletal: Negative for back pain and neck pain.  Skin: Negative for rash.  Neurological: Negative for dizziness, seizures, syncope, weakness and light-headedness.  All other systems  reviewed and are negative.     Allergies  Review of patient's allergies indicates no known allergies.  Home Medications   Prior to Admission medications   Medication Sig Start Date End Date Taking? Authorizing Provider  carvedilol (COREG) 25 MG tablet Take 25 mg by mouth 2 (two) times daily. 07/25/14  Yes Historical Provider, MD  furosemide (LASIX) 80 MG tablet Take 80 mg by mouth 2 (two) times daily.    Yes Historical Provider, MD  insulin lispro (HUMALOG) 100 UNIT/ML injection Inject 20 Units into the skin 2 (two) times daily with breakfast and lunch.   Yes Historical Provider, MD  insulin NPH Human (HUMULIN N,NOVOLIN N) 100 UNIT/ML injection Inject 20 Units into the skin 2 (two) times daily before a meal.    Yes Historical Provider, MD  oxyCODONE-acetaminophen (PERCOCET) 10-325 MG per tablet Take 1 tablet by mouth every 6 (six) hours as needed for pain.  11/04/13  Yes Historical Provider, MD  ramipril (ALTACE) 5 MG capsule Take 5 mg by mouth daily. 07/25/14  Yes Historical Provider, MD  rosuvastatin (CRESTOR) 10 MG tablet Take 10 mg by mouth daily.   Yes Historical Provider, MD  spironolactone (ALDACTONE) 25 MG tablet Take 25 mg by mouth 2 (two) times daily. 07/25/14  Yes Historical Provider, MD   BP 153/70 mmHg  Pulse 75  Temp(Src) 98.5 F (36.9 C) (Oral)  Resp 27  SpO2 92% Physical Exam  Constitutional: He is oriented to person, place, and time. He appears well-developed and well-nourished. No distress.  HENT:  Head: Normocephalic and atraumatic.  Nose: Nose normal.  Mouth/Throat: Oropharynx is clear and moist.  Eyes: Conjunctivae and EOM are normal. Pupils are equal, round, and reactive to light.  Neck: Neck supple.  Cardiovascular: Normal rate, regular rhythm, normal heart sounds and intact distal pulses.   Pulmonary/Chest: Tachypnea noted. He is in respiratory distress. He has decreased breath sounds.  Abdominal: Soft. He exhibits no distension. There is no tenderness.   Large abdominal post-surgical scar  Musculoskeletal: He exhibits edema (2+ in b/l LE symmetric b/l, nontender. ). He exhibits no tenderness.  Neurological: He is alert and oriented to person, place, and time. No cranial nerve deficit. Coordination normal.  Appears somnolent but arousable  Skin: Skin is warm and dry. No rash noted. He is not diaphoretic.  Nursing note and vitals reviewed.   ED Course  Procedures (including critical care time) Labs Review Labs Reviewed  CBC WITH DIFFERENTIAL/PLATELET - Abnormal; Notable for the following:    WBC 12.3 (*)    Platelets 147 (*)    Neutro Abs 10.2 (*)    All other components within normal limits  COMPREHENSIVE METABOLIC PANEL - Abnormal; Notable for the following:    Chloride 97 (*)    Glucose, Bld 136 (*)    BUN 25 (*)    Creatinine, Ser 2.42 (*)    Albumin 2.7 (*)    ALT 16 (*)    Alkaline Phosphatase 28 (*)  GFR calc non Af Amer 27 (*)    GFR calc Af Amer 31 (*)    All other components within normal limits  BRAIN NATRIURETIC PEPTIDE - Abnormal; Notable for the following:    B Natriuretic Peptide 258.6 (*)    All other components within normal limits  TROPONIN I - Abnormal; Notable for the following:    Troponin I 0.10 (*)    All other components within normal limits  I-STAT TROPOININ, ED - Abnormal; Notable for the following:    Troponin i, poc 0.09 (*)    All other components within normal limits  URINALYSIS, ROUTINE W REFLEX MICROSCOPIC (NOT AT [facility])  TROPONIN I  TROPONIN I  TROPONIN I    Imaging Review Dg Chest Port 1 View  01/26/2016  CLINICAL DATA:  severe short of breath for 3 days EXAM: PORTABLE CHEST 1 VIEW COMPARISON:  None. FINDINGS: LEFT-sided pacemaker overlies stable enlarged cardiac silhouette. There is central venous congestion. No focal infiltrate. No pneumothorax. IMPRESSION: Cardiomegaly and central venous congestion. Electronically Signed   By: Suzy Bouchard M.D.   On: 01/26/2016 21:59   I have  personally reviewed and evaluated these images and lab results as part of my medical decision-making.   EKG Interpretation   Date/Time:  Friday January 26 2016 20:56:25 EDT Ventricular Rate:  75 PR Interval:    QRS Duration: 136 QT Interval:  400 QTC Calculation: 446 R Axis:   -86 Text Interpretation:  Atrial flutter with 3:1 A-V conduction Left axis  deviation Non-specific intra-ventricular conduction block Inferior infarct  , age undetermined Anterolateral infarct , age undetermined Abnormal ECG  aflutter Non-specific intra-ventricular conduction delay ST-t wave  abnormality Abnormal ekg Confirmed by Carmin Muskrat  MD [phone]) on  01/26/2016 9:01:46 PM      MDM  65 y.o. male with a history of COPD, CHF with sick contacts at home recently presents to the ED noting progressively worsening shortness of breath, generalized fatigue and somnolence, productive cough with white sputum subjective fevers for the last 2-3 days. On arrival the patient was found to be hypoxic initially in the high 70s to 80s on room air.  He was placed on 5 L by nasal cannula which brought him to the low 90s but the patient remained somnolent but arousable. Pulm exam was concerning for diminished breath sounds b/l. He was then placed on bipap and significantly improved with significantly improved aeration and improved mental status. His labs were significant for slightly elevated troponin of 0.10, but BNP of 258. He had no associated chest pain throughout this episode. AKI with a Cr: 2.42. Leukocytosis of 12.3. CXR shows cardiomegaly and venous congestion. The patient was then admitted to the hospitalist service for further care and assessment.    Final diagnoses:  Cough        Zenovia Jarred, DO 01/27/16 0028  Carmin Muskrat, MD 01/29/16 [phone]

## 2016-01-26 NOTE — ED Notes (Addendum)
O2 sat dropped to 78% in triage. Designer, multimedia notified

## 2016-01-26 NOTE — ED Notes (Addendum)
Pt. reports persistent productive cough with chest congestion , SOB , generalized weakness/fatigue and fell last night at home , denies LOC / no injury. O2 sat= 87% at triage .

## 2016-01-27 ENCOUNTER — Inpatient Hospital Stay (HOSPITAL_COMMUNITY): Payer: Medicare Other

## 2016-01-27 ENCOUNTER — Encounter (HOSPITAL_COMMUNITY): Payer: Self-pay | Admitting: Internal Medicine

## 2016-01-27 DIAGNOSIS — E662 Morbid (severe) obesity with alveolar hypoventilation: Secondary | ICD-10-CM

## 2016-01-27 DIAGNOSIS — I5043 Acute on chronic combined systolic (congestive) and diastolic (congestive) heart failure: Secondary | ICD-10-CM

## 2016-01-27 DIAGNOSIS — I481 Persistent atrial fibrillation: Secondary | ICD-10-CM

## 2016-01-27 DIAGNOSIS — R0902 Hypoxemia: Secondary | ICD-10-CM

## 2016-01-27 DIAGNOSIS — G4733 Obstructive sleep apnea (adult) (pediatric): Secondary | ICD-10-CM

## 2016-01-27 DIAGNOSIS — J9620 Acute and chronic respiratory failure, unspecified whether with hypoxia or hypercapnia: Secondary | ICD-10-CM

## 2016-01-27 DIAGNOSIS — J9621 Acute and chronic respiratory failure with hypoxia: Secondary | ICD-10-CM

## 2016-01-27 DIAGNOSIS — Z79899 Other long term (current) drug therapy: Secondary | ICD-10-CM

## 2016-01-27 DIAGNOSIS — R778 Other specified abnormalities of plasma proteins: Secondary | ICD-10-CM | POA: Diagnosis present

## 2016-01-27 DIAGNOSIS — N179 Acute kidney failure, unspecified: Secondary | ICD-10-CM

## 2016-01-27 DIAGNOSIS — N189 Chronic kidney disease, unspecified: Secondary | ICD-10-CM

## 2016-01-27 DIAGNOSIS — I482 Chronic atrial fibrillation: Secondary | ICD-10-CM

## 2016-01-27 DIAGNOSIS — Z950 Presence of cardiac pacemaker: Secondary | ICD-10-CM

## 2016-01-27 DIAGNOSIS — L0390 Cellulitis, unspecified: Secondary | ICD-10-CM | POA: Diagnosis present

## 2016-01-27 DIAGNOSIS — M7989 Other specified soft tissue disorders: Secondary | ICD-10-CM

## 2016-01-27 DIAGNOSIS — R7989 Other specified abnormal findings of blood chemistry: Secondary | ICD-10-CM | POA: Diagnosis present

## 2016-01-27 HISTORY — DX: Chronic kidney disease, unspecified: N18.9

## 2016-01-27 HISTORY — DX: Acute and chronic respiratory failure, unspecified whether with hypoxia or hypercapnia: J96.20

## 2016-01-27 HISTORY — DX: Acute and chronic respiratory failure with hypoxia: J96.21

## 2016-01-27 HISTORY — DX: Chronic kidney disease, unspecified: N17.9

## 2016-01-27 LAB — CBC
HEMATOCRIT: 44.1 % (ref 39.0–52.0)
Hemoglobin: 14.9 g/dL (ref 13.0–17.0)
MCH: 33.9 pg (ref 26.0–34.0)
MCHC: 33.8 g/dL (ref 30.0–36.0)
MCV: 100.2 fL — AB (ref 78.0–100.0)
PLATELETS: 124 10*3/uL — AB (ref 150–400)
RBC: 4.4 MIL/uL (ref 4.22–5.81)
RDW: 15.5 % (ref 11.5–15.5)
WBC: 18.4 10*3/uL — ABNORMAL HIGH (ref 4.0–10.5)

## 2016-01-27 LAB — SODIUM, URINE, RANDOM: SODIUM UR: 60 mmol/L

## 2016-01-27 LAB — URINALYSIS, ROUTINE W REFLEX MICROSCOPIC
Bilirubin Urine: NEGATIVE
Glucose, UA: NEGATIVE mg/dL
KETONES UR: NEGATIVE mg/dL
NITRITE: NEGATIVE
Protein, ur: >300 mg/dL — AB
SPECIFIC GRAVITY, URINE: 1.016 (ref 1.005–1.030)
pH: 5.5 (ref 5.0–8.0)

## 2016-01-27 LAB — RAPID URINE DRUG SCREEN, HOSP PERFORMED
Amphetamines: NOT DETECTED
Barbiturates: NOT DETECTED
Benzodiazepines: NOT DETECTED
COCAINE: NOT DETECTED
OPIATES: NOT DETECTED
Tetrahydrocannabinol: NOT DETECTED

## 2016-01-27 LAB — INFLUENZA PANEL BY PCR (TYPE A & B)
H1N1FLUPCR: NOT DETECTED
INFLAPCR: NEGATIVE
Influenza B By PCR: NEGATIVE

## 2016-01-27 LAB — URINE MICROSCOPIC-ADD ON

## 2016-01-27 LAB — CREATININE, URINE, RANDOM: Creatinine, Urine: 95.41 mg/dL

## 2016-01-27 LAB — HEPARIN LEVEL (UNFRACTIONATED): Heparin Unfractionated: 0.3 IU/mL (ref 0.30–0.70)

## 2016-01-27 LAB — GLUCOSE, CAPILLARY
GLUCOSE-CAPILLARY: 177 mg/dL — AB (ref 65–99)
GLUCOSE-CAPILLARY: 206 mg/dL — AB (ref 65–99)
Glucose-Capillary: 254 mg/dL — ABNORMAL HIGH (ref 65–99)
Glucose-Capillary: 175 mg/dL — ABNORMAL HIGH (ref 65–99)
Glucose-Capillary: 174 mg/dL — ABNORMAL HIGH (ref 65–99)

## 2016-01-27 LAB — TROPONIN I
TROPONIN I: 0.15 ng/mL — AB (ref <0.031)
TROPONIN I: 0.14 ng/mL — AB (ref <0.031)
TROPONIN I: 0.11 ng/mL — AB (ref <0.031)

## 2016-01-27 LAB — BRAIN NATRIURETIC PEPTIDE: B Natriuretic Peptide: 409.7 pg/mL — ABNORMAL HIGH (ref 0.0–100.0)

## 2016-01-27 LAB — MRSA PCR SCREENING: MRSA by PCR: NEGATIVE

## 2016-01-27 MED ORDER — OXYCODONE HCL 5 MG PO TABS
5.0000 mg | ORAL_TABLET | Freq: Four times a day (QID) | ORAL | Status: DC | PRN
  Administered 2016-01-27 – 2016-02-06 (×31): 5 mg via ORAL
  Filled 2016-01-27 (×31): qty 1

## 2016-01-27 MED ORDER — FUROSEMIDE 10 MG/ML IJ SOLN
60.0000 mg | Freq: Two times a day (BID) | INTRAMUSCULAR | Status: DC
  Administered 2016-01-27: 60 mg via INTRAVENOUS
  Filled 2016-01-27 (×2): qty 6

## 2016-01-27 MED ORDER — VANCOMYCIN HCL 10 G IV SOLR: 1750.0000 mg | INTRAVENOUS | Status: DC

## 2016-01-27 MED ORDER — ACETAMINOPHEN 325 MG PO TABS
650.0000 mg | ORAL_TABLET | ORAL | Status: DC | PRN
  Administered 2016-01-27 – 2016-02-06 (×4): 650 mg via ORAL
  Filled 2016-01-27 (×4): qty 2

## 2016-01-27 MED ORDER — IPRATROPIUM BROMIDE 0.02 % IN SOLN
0.5000 mg | Freq: Four times a day (QID) | RESPIRATORY_TRACT | Status: DC
  Administered 2016-01-27 – 2016-01-31 (×19): 0.5 mg via RESPIRATORY_TRACT
  Filled 2016-01-27 (×20): qty 2.5

## 2016-01-27 MED ORDER — ROSUVASTATIN CALCIUM 10 MG PO TABS
10.0000 mg | ORAL_TABLET | Freq: Every day | ORAL | Status: DC
  Administered 2016-01-27 – 2016-02-05 (×10): 10 mg via ORAL
  Filled 2016-01-27 (×9): qty 1

## 2016-01-27 MED ORDER — OXYCODONE-ACETAMINOPHEN 5-325 MG PO TABS
1.0000 | ORAL_TABLET | Freq: Four times a day (QID) | ORAL | Status: DC | PRN
Start: 2016-01-27 — End: 2016-02-06
  Administered 2016-01-27 – 2016-02-06 (×35): 1 via ORAL
  Filled 2016-01-27 (×35): qty 1

## 2016-01-27 MED ORDER — GUAIFENESIN ER 600 MG PO TB12
600.0000 mg | ORAL_TABLET | Freq: Two times a day (BID) | ORAL | Status: DC
  Administered 2016-01-27 – 2016-02-06 (×22): 600 mg via ORAL
  Filled 2016-01-27 (×22): qty 1

## 2016-01-27 MED ORDER — INSULIN ASPART 100 UNIT/ML ~~LOC~~ SOLN: 0.0000 [IU] | SUBCUTANEOUS | Status: DC

## 2016-01-27 MED ORDER — ENOXAPARIN SODIUM 30 MG/0.3ML ~~LOC~~ SOLN
30.0000 mg | SUBCUTANEOUS | Status: DC
  Administered 2016-01-27: 30 mg via SUBCUTANEOUS
  Filled 2016-01-27: qty 0.3

## 2016-01-27 MED ORDER — DEXTROSE 5 % IV SOLN
500.0000 mg | Freq: Every day | INTRAVENOUS | Status: DC
  Administered 2016-01-27 – 2016-01-29 (×3): 500 mg via INTRAVENOUS
  Filled 2016-01-27 (×3): qty 500

## 2016-01-27 MED ORDER — CARVEDILOL 25 MG PO TABS
25.0000 mg | ORAL_TABLET | Freq: Two times a day (BID) | ORAL | Status: DC
  Administered 2016-01-27: 25 mg via ORAL
  Filled 2016-01-27 (×2): qty 1

## 2016-01-27 MED ORDER — ONDANSETRON HCL 4 MG/2ML IJ SOLN: 4.0000 mg | Freq: Four times a day (QID) | INTRAMUSCULAR | Status: DC | PRN

## 2016-01-27 MED ORDER — SODIUM CHLORIDE 0.9% FLUSH
3.0000 mL | Freq: Two times a day (BID) | INTRAVENOUS | Status: DC
  Administered 2016-01-27 – 2016-02-06 (×21): 3 mL via INTRAVENOUS

## 2016-01-27 MED ORDER — FUROSEMIDE 10 MG/ML IJ SOLN
80.0000 mg | Freq: Three times a day (TID) | INTRAMUSCULAR | Status: DC
  Administered 2016-01-27 – 2016-02-04 (×23): 80 mg via INTRAVENOUS
  Filled 2016-01-27 (×24): qty 8

## 2016-01-27 MED ORDER — CEFTRIAXONE SODIUM 1 G IJ SOLR
1.0000 g | Freq: Every day | INTRAMUSCULAR | Status: DC
  Administered 2016-01-27 – 2016-01-31 (×5): 1 g via INTRAVENOUS
  Filled 2016-01-27 (×5): qty 10

## 2016-01-27 MED ORDER — ALBUTEROL SULFATE (2.5 MG/3ML) 0.083% IN NEBU: 2.5000 mg | INHALATION_SOLUTION | RESPIRATORY_TRACT | Status: DC | PRN

## 2016-01-27 MED ORDER — HEPARIN (PORCINE) IN NACL 100-0.45 UNIT/ML-% IJ SOLN
1800.0000 [IU]/h | INTRAMUSCULAR | Status: AC
  Administered 2016-01-27: 1500 [IU]/h via INTRAVENOUS
  Administered 2016-01-28: 1600 [IU]/h via INTRAVENOUS
  Filled 2016-01-27 (×2): qty 250

## 2016-01-27 MED ORDER — VANCOMYCIN HCL 10 G IV SOLR
2500.0000 mg | Freq: Once | INTRAVENOUS | Status: AC
  Administered 2016-01-27: 2500 mg via INTRAVENOUS
  Filled 2016-01-27: qty 2500

## 2016-01-27 MED ORDER — HEPARIN BOLUS VIA INFUSION
4000.0000 [IU] | Freq: Once | INTRAVENOUS | Status: AC
  Administered 2016-01-27: 4000 [IU] via INTRAVENOUS
  Filled 2016-01-27: qty 4000

## 2016-01-27 MED ORDER — INSULIN NPH (HUMAN) (ISOPHANE) 100 UNIT/ML ~~LOC~~ SUSP
20.0000 [IU] | Freq: Every day | SUBCUTANEOUS | Status: DC
  Administered 2016-01-27 – 2016-01-30 (×4): 20 [IU] via SUBCUTANEOUS
  Filled 2016-01-27: qty 10

## 2016-01-27 MED ORDER — LEVALBUTEROL HCL 1.25 MG/0.5ML IN NEBU: 0.6300 mg | INHALATION_SOLUTION | Freq: Four times a day (QID) | RESPIRATORY_TRACT | Status: DC

## 2016-01-27 MED ORDER — INSULIN ASPART 100 UNIT/ML ~~LOC~~ SOLN
0.0000 [IU] | Freq: Three times a day (TID) | SUBCUTANEOUS | Status: DC
  Administered 2016-01-27: 3 [IU] via SUBCUTANEOUS
  Administered 2016-01-27: 2 [IU] via SUBCUTANEOUS
  Administered 2016-01-27: 5 [IU] via SUBCUTANEOUS

## 2016-01-27 MED ORDER — OXYCODONE-ACETAMINOPHEN 10-325 MG PO TABS: 1.0000 | ORAL_TABLET | Freq: Four times a day (QID) | ORAL | Status: DC | PRN

## 2016-01-27 MED ORDER — SODIUM CHLORIDE 0.9 % IV SOLN: 250.0000 mL | INTRAVENOUS | Status: DC | PRN

## 2016-01-27 MED ORDER — SODIUM CHLORIDE 0.9% FLUSH
3.0000 mL | INTRAVENOUS | Status: DC | PRN
  Administered 2016-01-31: 3 mL via INTRAVENOUS
  Filled 2016-01-27: qty 3

## 2016-01-27 MED ORDER — SPIRONOLACTONE 25 MG PO TABS
25.0000 mg | ORAL_TABLET | Freq: Two times a day (BID) | ORAL | Status: DC
  Administered 2016-01-27 – 2016-02-06 (×21): 25 mg via ORAL
  Filled 2016-01-27 (×21): qty 1

## 2016-01-27 NOTE — Progress Notes (Signed)
Pharmacy Antibiotic Note  Kurt Cross is a 65 y.o. male admitted on 01/26/2016 with cellulitis.  Pharmacy has been consulted for vancomycin dosing.  Plan: Vancomycin 2500mg  x1 followed by 1750mg  IV every 24 hours.  Goal trough 10-15 mcg/mL.  Height: 5\' 11"  (180.3 cm) Weight: (!) 317 lb 11.2 oz (144.108 kg) IBW/kg (Calculated) : 75.3  Temp (24hrs), Avg:100.8 F (38.2 C), Min:98.5 F (36.9 C), Max:103.1 F (39.5 C)   Recent Labs Lab 01/26/16 2106  WBC 12.3*  CREATININE 2.42*    Estimated Creatinine Clearance: 46 mL/min (by C-G formula based on Cr of 2.42).    No Known Allergies    Thank you for allowing pharmacy to be a part of this patient's care.  Wynona Neat, PharmD, BCPS  01/27/2016 1:56 AM

## 2016-01-27 NOTE — Progress Notes (Signed)
ANTICOAGULATION CONSULT NOTE - Follow-up Consult  Pharmacy Consult for heparin Indication: atrial fibrillation  No Known Allergies  Patient Measurements: Height: 5\' 11"  (180.3 cm) Weight: (!) 317 lb 11.2 oz (144.108 kg) IBW/kg (Calculated) : 75.3 Heparin Dosing Weight: 110kg  Vital Signs: Temp: 98.7 F (37.1 C) (04/22 2000) Temp Source: Oral (04/22 2000) BP: 151/76 mmHg (04/22 2000) Pulse Rate: 97 (04/22 2100)  Labs:  Recent Labs  01/26/16 2106  01/27/16 0241 01/27/16 0714 01/27/16 1159 01/27/16 2227  HGB 16.9  --   --   --   --  14.9  HCT 49.8  --   --   --   --  44.1  PLT 147*  --   --   --   --  124*  HEPARINUNFRC  --   --   --   --   --  0.30  CREATININE 2.42*  --   --   --   --   --   TROPONINI  --   < > 0.15* 0.14* 0.11*  --   < > = values in this interval not displayed.  Estimated Creatinine Clearance: 46 mL/min (by C-G formula based on Cr of 2.42).  Assessment: 64 YOM on heparin for Afib. Heparin level low end of therapeutic. Hgb, plt trending down a bit. No bleeding noted.  Goal of Therapy:  Heparin level 0.3-0.7 units/ml Monitor platelets by anticoagulation protocol: Yes   Plan:  Increase heparin infusion to 1500 units/hr F/u confirmatory 6 hr heparin level Follow for long term [facility] plans  Sherlon Handing, PharmD, BCPS Clinical pharmacist, pager [phone] 01/27/2016 10:58 PM

## 2016-01-27 NOTE — ED Notes (Signed)
Pt made aware the need for urine. Pt given urinal

## 2016-01-27 NOTE — H&P (Addendum)
Kurt Cross [id number] DOB: 01-Oct-1951 DOA: 01/26/2016   Referring MD Ronnald Ramp PCP: [name], MD   Outpatient Specialists: cardiology Hermelinda Dellen Patient coming from: home  Chief Complaint: Shortness of breath  HPI: Kurt Cross is a 65 y.o. male with medical history significant of COPD, combined systolic and diastolic heart failure, chronic atrial fibrillation/flutter in the past requiring ventricular pacemaker secondary to severe bradycardia not  on anticoagulation possibly due to noncompliance. sleep apnea, morbid obesity, diabetes, hypertension and abdominal aortic aneurysm    Presented with shortness of breath cough productive of sputum fatigue and weakness to point where he has fallen last night. Did not lose Consciousness. His wife has been sick recently and was diagnosed with pneumonia. Patient may have to be on oxygen at home but hasn't been using it. Has not been compliant his breathing treatments. His illness has been associated with sore throat subjective feels chills she denies any chest pain no nausea no vomiting or diarrhea no abdominal pain. He does endorse lower extremity swelling wife noted abrasion to the left calf with increased swelling  Patient had a fall yesterday hitting his left knee  Regarding pertinent Chronic problems: History of diabetes or compliance had been seen in emergency for hypoglycemia in the past. In 2015 had male use stress test showed EF of 40% with evidence of old inferior wall infarct and moderate peri-infarct ischemia  Patient has complex surgical history required extensive abdominal surgery secondary to large abscess; echo perforation of the rectum resulting in short gut syndrome. Infected prosthetic mesh of abdominal wall requiring removal in 2014.[name] Kitchen He has known abdominal or sick aneurysm measuring 5.5 cm her CAT scan in 2014   IN ER: Found to be hypoxic down to 78% room air.  Patient has increased work of breathing. Was noted to be  somewhat somnolent in emergency department started on BiPAP Troponin was noted to be elevated to 0.10. BNP 258 Albumin 2.7 Cr 2.42 up from baseline of 1.71 in January     Hospitalist was called for admission for acute on chronic respiratory failure secondary to CHF exacerbation versus COPD exacerbation  Review of Systems:    Pertinent positives include: Fevers, chills, fatigue, shortness of breath at rest. dyspnea on exertion,  Bilateral lower extremity swelling, lethargy, abdominal distension Constitutional:  No weight loss, night sweats, weight loss  HEENT:  No headaches, Difficulty swallowing,Tooth/dental problems,Sore throat,  No sneezing, itching, ear ache, nasal congestion, post nasal drip,  Cardio-vascular:  No chest pain, Orthopnea, PND, anasarca, dizziness, palpitations.no  GI:  No heartburn, indigestion, abdominal pain, nausea, vomiting, diarrhea, change in bowel habits, loss of appetite, melena, blood in stool, hematemesis Resp:  no  No No excess mucus, no productive cough, No non-productive cough, No coughing up of blood.No change in color of mucus.No wheezing. Skin:  no rash or lesions. No jaundice GU:  no dysuria, change in color of urine, no urgency or frequency. No straining to urinate.  No flank pain.  Musculoskeletal:  No joint pain or no joint swelling. No decreased range of motion. No back pain.  Psych:  No change in mood or affect. No depression or anxiety. No memory loss.  Neuro: no localizing neurological complaints, no tingling, no weakness, no double vision, no gait abnormality, no slurred speech, no confusion  As per HPI otherwise 10 point review of systems negative.   Past Medical History: Past Medical History  Diagnosis Date  . Sleep apnea   . Diabetes mellitus, type 2 (  [place])   . Hypertension   . Hyperlipidemia   . CHF (congestive heart failure), NYHA class II ([place])   . Hernia of abdominal cavity   . AAA (abdominal aortic aneurysm)/ 4.5 cm  ascending per CT angio 08/01/13 08/12/2013  . 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 Constipation, chronic 08/03/2013  . Benign hypertension   . Hypokalemia   . OSA (obstructive sleep apnea)   . Neuropathy ([place])   . Hx of Mobitz type II block     s/p PPM Bi-Ven   Past Surgical History  Procedure Laterality Date  . Appendectomy    . Tonsillectomy and adenoidectomy    . Nasal septum surgery    . Bowel resection  1980s?    ?colectomy/ostomy - Dr Elesa Hacker  . Ostomy take down  1980s?    Dr Elesa Hacker  . Incisional hernia repair  1980s?    Dr. Lindon Romp - w mesh  . Knee surgery Right   . Incision and drainage abscess N/A 08/14/2013    Procedure: INCISION AND DRAINAGE ABSCESS, mesh ;  Surgeon: [name], MD;  Location: [facility] ORS;  Service: General;  Laterality: N/A;  . Left heart catheterization with coronary angiogram N/A 03/17/2014    Procedure: LEFT HEART CATHETERIZATION WITH CORONARY ANGIOGRAM;  Surgeon: [name], MD;  Location[facility] C[facility] LAB;  Service: Cardiovascular;  Laterality: N/A;     Social History:  Ambulatory  With rolling walker   Lives at home  With family     reports that he quit smoking about 3 years ago. His smoking use included Cigarettes. He has a 40 pack-year smoking history. He has never used smokeless tobacco. He reports that he drinks alcohol. He reports that he does not use illicit drugs.  Allergies:  No Known Allergies     Family History:  Family History  Problem Relation Age of Onset  . Diabetes Mother   . Alcoholism Father   . Heart Problems Maternal Grandmother     Medications: Prior to Admission medications   Medication Sig Start Date End Date Taking? Authorizing Provider  carvedilol (COREG) 25 MG tablet Take 25 mg by mouth 2 (two) times daily. 07/25/14  Yes Historical Provider, MD  furosemide (LASIX) 80 MG tablet Take 80 mg by mouth 2 (two) times daily.    Yes Historical Provider,  MD  insulin lispro (HUMALOG) 100 UNIT/ML injection Inject 20 Units into the skin 2 (two) times daily with breakfast and lunch.   Yes Historical Provider, MD  insulin NPH Human (HUMULIN N,NOVOLIN N) 100 UNIT/ML injection Inject 20 Units into the skin 2 (two) times daily before a meal.    Yes Historical Provider, MD  oxyCODONE-acetaminophen (PERCOCET) 10-325 MG per tablet Take 1 tablet by mouth every 6 (six) hours as needed for pain.  11/04/13  Yes Historical Provider, MD  ramipril (ALTACE) 5 MG capsule Take 5 mg by mouth daily. 07/25/14  Yes Historical Provider, MD  rosuvastatin (CRESTOR) 10 MG tablet Take 10 mg by mouth daily.   Yes Historical Provider, MD  spironolactone (ALDACTONE) 25 MG tablet Take 25 mg by mouth 2 (two) times daily. 07/25/14  Yes Historical Provider, MD    Physical Exam: Patient Vitals for the past 24 hrs:  BP Temp Temp src Pulse Resp SpO2  01/27/16 0000 153/70 mmHg - - 75 - 92 %  01/26/16 2330 169/72 mmHg - - 74 (!) 27 92 %  01/26/16 2315  173/81 mmHg - - 74 22 97 %  01/26/16 2257 - - - 73 26 97 %  01/26/16 2230 163/78 mmHg - - 75 24 94 %  01/26/16 2215 (!) 153/107 mmHg - - 75 19 94 %  01/26/16 2200 - - - 73 25 93 %  01/26/16 2145 - - - 73 19 94 %  01/26/16 2122 154/85 mmHg - - 74 19 94 %  01/26/16 2103 193/71 mmHg 98.5 F (36.9 C) Oral 76 24 (!) 88 %    1. General:  in No Acute distress 2. Psychological: lethargic and  Oriented 3. Head/ENT:   Moist  Mucous Membranes                          Head Non traumatic, neck supple                           Poor Dentition 4. SKIN: normal Skin turgor,  Skin clean Dry and intact no rash 5. Heart: Regular rate and rhythm no  Murmur, Rub or gallop 6. Lungs:   no wheezes some crackles   7. Abdomen: Soft, non-tender, Non distended 8. Lower extremities: no clubbing, cyanosis, or edema 9. Neurologically Grossly intact, moving all 4 extremities equally 10. MSK: Normal range of motion   body mass index is unknown because there  is no weight on file.  Labs on Admission:   Labs on Admission: I have personally reviewed following labs and imaging studies  CBC:  Recent Labs Lab 01/26/16 2106  WBC 12.3*  NEUTROABS 10.2*  HGB 16.9  HCT 49.8  MCV 99.4  PLT [id number]*   Basic Metabolic Panel:  Recent Labs Lab 01/26/16 2106  NA 139  K 3.8  CL 97*  CO2 27  GLUCOSE 136*  BUN 25*  CREATININE 2.42*  CALCIUM 9.1   GFR: CrCl cannot be calculated (Unknown ideal weight.). Liver Function Tests:  Recent Labs Lab 01/26/16 2106  AST 26  ALT 16*  ALKPHOS 28*  BILITOT 1.2  PROT 7.2  ALBUMIN 2.7*   No results for input(s): LIPASE, AMYLASE in the last 168 hours. No results for input(s): AMMONIA in the last 168 hours. Coagulation Profile: No results for input(s): INR, PROTIME in the last 168 hours. Cardiac Enzymes:  Recent Labs Lab 01/26/16 2134  TROPONINI 0.10*   BNP (last 3 results) No results for input(s): PROBNP in the last 8760 hours. HbA1C: No results for input(s): HGBA1C in the last 72 hours. CBG: No results for input(s): GLUCAP in the last 168 hours. Lipid Profile: No results for input(s): CHOL, HDL, LDLCALC, TRIG, CHOLHDL, LDLDIRECT in the last 72 hours. Thyroid Function Tests: No results for input(s): TSH, T4TOTAL, FREET4, T3FREE, THYROIDAB in the last 72 hours. Anemia Panel: No results for input(s): VITAMINB12, FOLATE, FERRITIN, TIBC, IRON, RETICCTPCT in the last 72 hours. Urine analysis:    Component Value Date/Time   COLORURINE YELLOW 11/13/2013 0630   APPEARANCEUR CLEAR 11/13/2013 0630   LABSPEC 1.015 11/13/2013 0630   PHURINE 5.0 11/13/2013 0630   GLUCOSEU NEGATIVE 11/13/2013 0630   HGBUR LARGE* 11/13/2013 0630   BILIRUBINUR NEGATIVE 11/13/2013 0630   KETONESUR NEGATIVE 11/13/2013 0630   PROTEINUR NEGATIVE 11/13/2013 0630   UROBILINOGEN 0.2 11/13/2013 0630   NITRITE NEGATIVE 11/13/2013 0630   LEUKOCYTESUR MODERATE* 11/13/2013 0630   Sepsis  Labs: @LABRCNTIP (procalcitonin:4,lacticidven:4) )No results found for this or any previous visit (from the past 240 hour(s)).  UA ordered  Lab Results  Component Value Date   HGBA1C 9.5* 08/02/2013    CrCl cannot be calculated (Unknown ideal weight.).  BNP (last 3 results) No results for input(s): PROBNP in the last 8760 hours.   ECG REPORT  Independently reviewed Rate:75   Rhythm: Aflutter with 3:1 block intraventricular block ST&T Change: No acute ischemic changes   QTC 446   There were no vitals filed for this visit.   Cultures:    Component Value Date/Time   SDES URINE, CATHETERIZED 11/13/2013 0630   SPECREQUEST NONE 11/13/2013 0630   CULT NO GROWTH Performed at [facility] 11/13/2013 0630   REPTSTATUS 11/14/2013 FINAL 11/13/2013 0630     Radiological Exams on Admission: Dg Chest Port 1 View  01/26/2016  CLINICAL DATA:  severe short of breath for 3 days EXAM: PORTABLE CHEST 1 VIEW COMPARISON:  None. FINDINGS: LEFT-sided pacemaker overlies stable enlarged cardiac silhouette. There is central venous congestion. No focal infiltrate. No pneumothorax. IMPRESSION: Cardiomegaly and central venous congestion. Electronically Signed   By: Suzy Bouchard M.D.   On: 01/26/2016 21:59    Chart has been reviewed  Assessment/Plan  65 yo M with hx of combined diastolic systolic CHF, diabetes, atrial fibrillation not on anticoagulation, History of COPD noncompliance presents with URI type symptoms with fatigue and somnolence was found to be hypoxic in emergency department responded well to BiPAP currently being admitted to stepdown for possible CHF exacerbation versus COPD exacerbation or early pneumonia Present on Admission:   . Atrial fibrillation/flutter - chronic, CHA2DS2-Vasc score 3, will need to clarify why patient not on anticoagulation, possibly due to noncompliance . Acute on chronic combined systolic and diastolic CHF, NYHA class 2 ([place]) -  admit on  telemetry, cycle cardiac enzymes, obtain serial ECG, to evaluate for ischemia as a cause of heart failure  monitor daily weight  diurese with IV lasix and monitor   and creatinine to avoid over diuresis.  Order echogram to evaluate EF and valves Not on ACE/ARBi due to renal insufficiency  cardiology consult . Obstructive sleep apnea - on BiPAP currently . Acute on chronic renal failure ([place]) - Possibly diminished in the setting of a diminished EF obtain urine electrolytes follow closely while being diuresed  . Diabetes mellitus, insulin dependent (IDDM), uncontrolled (HCC) - SSI, chang NPH to once a day due to renal insufficiency . Hypertension -hold lisinopril . Acute on chronic respiratory failure with hypoxia (H[facility]) - we'll obtain a VBG admit to step down on BiPAP notify pulmonology, given history of COPD possible contribution by COPD exacerbation and pulmonary coverage make sure patient has albuterol as needed and Atrovent, given minimal wheezing at this time will hold off on steroids. Will repeat CXR tomorrow to see if develops any changes . Cellulitis - cover with vancomycin given some purulent discharge Elevated troponin - likely secondary to demand ischemia continue to cycle repeat EKG in the morning and repeat echogram. Cardiology consult Other plan as per orders.  DVT prophylaxis:   Lovenox     Code Status:  FULL CODE as per patient    Family Communication:   Family  at  Bedside  plan of care was discussed with Wife Shalom Grande [phone]  Disposition Plan:     To home once workup is complete and patient is stable   Consults called: emailed cardiology, let Pulmonology know about patient to help monitor through e-link  Admission status:   inpatient      Level of care  SDU     I have spent a total of 75 min on this admission    Sherril Shipman 01/27/2016, 1:31 AM    [facility]  Pager [phone]   after 2 AM please page floor coverage PA If 7AM-7PM, please  contact the day team taking care of the patient  [email or web address]  Password [email or web address]

## 2016-01-27 NOTE — Progress Notes (Signed)
Patient admitted after midnight, please see H&P.  Saw Dr. Mare Ferrari in 2015 and does not look like he followed up.  HR from 28-160s.  IV lasix, foley, echo. Cardiology consult IV abx- flu negative Requiring BIPAP- continue  Eulogio Bear DO

## 2016-01-27 NOTE — Progress Notes (Signed)
ANTICOAGULATION CONSULT NOTE - Initial Consult  Pharmacy Consult for heparin Indication: atrial fibrillation  No Known Allergies  Patient Measurements: Height: 5\' 11"  (180.3 cm) Weight: (!) 317 lb 11.2 oz (144.108 kg) IBW/kg (Calculated) : 75.3 Heparin Dosing Weight: 110kg  Vital Signs: Temp: 97.7 F (36.5 C) (04/22 1223) Temp Source: Axillary (04/22 1223) BP: 128/60 mmHg (04/22 1223) Pulse Rate: 50 (04/22 1547)  Labs:  Recent Labs  01/26/16 2106  01/27/16 0241 01/27/16 0714 01/27/16 1159  HGB 16.9  --   --   --   --   HCT 49.8  --   --   --   --   PLT 147*  --   --   --   --   CREATININE 2.42*  --   --   --   --   TROPONINI  --   < > 0.15* 0.14* 0.11*  < > = values in this interval not displayed.  Estimated Creatinine Clearance: 46 mL/min (by C-G formula based on Cr of 2.42).  Assessment: 23 YOM to start heparin for AFib. Prophylactic Lovenox 30mg  given at 1015 this morning (not adequate for patient's weight). Baseline Hgb 16.9, plts 147. No bleeding noted.  Patient not on anticoagulation PTA.  Goal of Therapy:  Heparin level 0.3-0.7 units/ml Monitor platelets by anticoagulation protocol: Yes   Plan:  -heparin bolus with 4000 units IV x1, then start infusion at 1500 units/hr -6h HL -daily HL and CBC -follow for long term AC plans  Kindell Strada D. Denym Rahimi, PharmD, BCPS Clinical Pharmacist Pager: [phone] 01/27/2016 4:39 PM

## 2016-01-27 NOTE — Progress Notes (Signed)
Pt off Bipap and in no major respiratory distress at this time.  Resting comfortably and conversive in full sentences.  SpO2 90-93%

## 2016-01-27 NOTE — Consult Note (Signed)
CARDIOLOGY CONSULT NOTE   Patient ID: Kurt Cross MRN: [id number], DOB/AGE: [birth year]   Admit date: 01/26/2016 Date of Consult: 01/27/2016  Primary Physician: [name], MD Primary Cardiologist: previously Dr Doylene Canard, Dr Terrence Dupont, most recently seen by Dr Mare Ferrari  Reason for consult:  CHF, anticoagulation, a-fib  Problem List  Past Medical History  Diagnosis Date  . Sleep apnea   . Diabetes mellitus, type 2 ([place])   . Hypertension   . Hyperlipidemia   . CHF (congestive heart failure), NYHA class II ([place])   . Hernia of abdominal cavity   . AAA (abdominal aortic aneurysm)/ 4.5 cm ascending per CT angio 08/01/13 08/12/2013  . 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 Constipation, chronic 08/03/2013  . Benign hypertension   . Hypokalemia   . OSA (obstructive sleep apnea)   . Neuropathy ([place])   . Hx of Mobitz type II block     s/p PPM Bi-Ven  . Atrial fibrillation [facility])     Past Surgical History  Procedure Laterality Date  . Appendectomy    . Tonsillectomy and adenoidectomy    . Nasal septum surgery    . Bowel resection  1980s?    ?colectomy/ostomy - Dr Elesa Hacker  . Ostomy take down  1980s?    Dr Elesa Hacker  . Incisional hernia repair  1980s?    Dr. Lindon Romp - w mesh  . Knee surgery Right   . Incision and drainage abscess N/A 08/14/2013    Procedure: INCISION AND DRAINAGE ABSCESS, mesh ;  Surgeon: [name], MD;  Location: [facility] ORS;  Service: General;  Laterality: N/A;  . Left heart catheterization with coronary angiogram N/A 03/17/2014    Procedure: LEFT HEART CATHETERIZATION WITH CORONARY ANGIOGRAM;  Surgeon: [name], MD;  Location: [facility] CATH LAB;  Service: Cardiovascular;  Laterality: N/A;     Allergies  No Known Allergies  HPI   65 year old morbidly obese male previously followed by Drs Doylene Canard, Terrence Dupont and [place], for chronic systolic CHF, atrial fibrillation/flutter, PM follow up and sleep  apnea. He was last seen in the clinic in 09/2014. He has had a prior ventricular pacemaker for severe bradycardia. He was in [place] in July 2015 for abdominal surgery and it was noted that the pacemaker battery was end-of-life and a new generator battery was placed. The patient was on anticoagulation for his atrial flutter fibrillation at the time. The patient's last echocardiogram in the chart was 08/12/13 which showed an ejection fraction of 50-55%. The patient had a Myoview stress test on 03/09/14 showing an ejection fraction of 40% with evidence of an old inferior wall infarct with moderate peri-infarct ischemia. The patient denied any symptoms of chest pain or angina at the time. His PMH reveals that the patient has had extensive abdominal surgery for complications following a mechanical perforation of the rectum.  The patient presented yesterday with shortness of breath cough productive of sputum, fatigue, Chills and weakness to point where he has fallen last night. Did not lose Consciousness. His wife has been sick recently and was diagnosed with pneumonia. Patient may have to be on oxygen at home but hasn't been using it. Has not been compliant his breathing treatments. While in the ER he was found to be hypoxic down to 78% on room air, decreased work of breathing, he was started on BiPAP in the ER and transferred to ICU. He was also found to have worsening kidney failure with  creatinine 2.4 from baseline of 1.7. Troponin was noted to be elevated to 0.10. BNP 258.  Patient is currently in ICU, on BiPAP, somnolent opens eyes on shaking but then falls asleep again.  Inpatient Medications  . azithromycin  500 mg Intravenous Q0600  . carvedilol  25 mg Oral BID WC  . cefTRIAXone (ROCEPHIN) IVPB 1 gram/50 mL D5W  1 g Intravenous Q0600  . enoxaparin (LOVENOX) injection  30 mg Subcutaneous Q24H  . furosemide  60 mg Intravenous BID  . guaiFENesin  600 mg Oral BID  . insulin aspart  0-9 Units  Subcutaneous TID WC  . insulin NPH Human  20 Units Subcutaneous QHS  . ipratropium  0.5 mg Nebulization Q6H  . rosuvastatin  10 mg Oral q1800  . sodium chloride flush  3 mL Intravenous Q12H  . spironolactone  25 mg Oral BID   Family History Family History  Problem Relation Age of Onset  . Diabetes Mother   . Alcoholism Father   . Heart Problems Maternal Grandmother     Social History Social History   Social History  . Marital Status: Married    Spouse Name: N/A  . Number of Children: N/A  . Years of Education: N/A   Occupational History  . Not on file.   Social History Main Topics  . Smoking status: Former Smoker -- 1.00 packs/day for 40 years    Types: Cigarettes    Quit date: 08/21/2012  . Smokeless tobacco: Never Used  . Alcohol Use: 0.0 oz/week    0 Standard drinks or equivalent per week     Comment: occasional  . Drug Use: No  . Sexual Activity: Not on file   Other Topics Concern  . Not on file   Social History Narrative    Review of Systems  General:  No chills, fever, night sweats or weight changes.  Cardiovascular:  No chest pain, dyspnea on exertion, edema, orthopnea, palpitations, paroxysmal nocturnal dyspnea. Dermatological: No rash, lesions/masses Respiratory: No cough, dyspnea Urologic: No hematuria, dysuria Abdominal:   No nausea, vomiting, diarrhea, bright red blood per rectum, melena, or hematemesis Neurologic:  No visual changes, wkns, changes in mental status. All other systems reviewed and are otherwise negative except as noted above.  Physical Exam  Blood pressure 128/60, pulse 51, temperature 97.7 F (36.5 C), temperature source Axillary, resp. rate 20, height 5\' 11"  (1.803 m), weight 317 lb 11.2 oz (144.108 kg), SpO2 95 %.  General: somnolent, on BiPAP, difficult to arouse Morbidly obese Neuro: Alert and oriented X 3. Moves all extremities spontaneously. HEENT: Normal  Neck: unable to evaluate due to obesity Lungs:  Resp regular and  unlabored, CTA. Heart: RRR no s3, s4, or murmurs. Abdomen: Soft, non-tender, non-distended, BS + x 4.  Extremities: No clubbing, cyanosis or edema. DP/PT/Radials 2+ and equal bilaterally.  Labs  Recent Labs  01/26/16 2134 01/27/16 0241 01/27/16 0714  TROPONINI 0.10* 0.15* 0.14*   Lab Results  Component Value Date   WBC 12.3* 01/26/2016   HGB 16.9 01/26/2016   HCT 49.8 01/26/2016   MCV 99.4 01/26/2016   PLT 147* 01/26/2016    Recent Labs Lab 01/26/16 2106  NA 139  K 3.8  CL 97*  CO2 27  BUN 25*  CREATININE 2.42*  CALCIUM 9.1  PROT 7.2  BILITOT 1.2  ALKPHOS 28*  ALT 16*  AST 26  GLUCOSE 136*   Lab Results  Component Value Date   CHOL  06/15/2007    126  ATP III CLASSIFICATION:  <200     mg/dL   Desirable  200-239  mg/dL   Borderline High  >=240    mg/dL   High   HDL 24* 06/15/2007   LDLCALC  06/15/2007    83        Total Cholesterol/HDL:CHD Risk Coronary Heart Disease Risk Table                     Men   Women  1/2 Average Risk   3.4   3.3   TRIG 82 11/15/2013   No results found for: DDIMER Invalid input(s): POCBNP  Radiology/Studies  Dg Chest Port 1 View  01/26/2016  CLINICAL DATA:  severe short of breath for 3 days EXAM: PORTABLE CHEST 1 VIEW COMPARISON:  None. FINDINGS: LEFT-sided pacemaker overlies stable enlarged cardiac silhouette. There is central venous congestion. No focal infiltrate. No pneumothorax. IMPRESSION: Cardiomegaly and central venous congestion. Electronically Signed   By: Suzy Bouchard M.D.   On: 01/26/2016 21:59   Echocardiogram - none  ECG: Ventricular pacing with ventricular lead 50 bpm, underlying rhythm atrial flutter, this is unchanged from prior EK   ASSESSMENT AND PLAN  1. Acute on chronic respiratory failure with hypoxia 2. Acute on chronic diastolic CHF 3. Troponin elevation  4. Chronic atrial flutter-fibrillation. Chads vasc score is 4 for congestive heart failure, diabetes, vascular disease (abdominal  aortic aneurysm) and hypertension. 5. Obesity-hypoventilation syndrome 6. PM in place 7. Abdominal aortic aneurysm 8. Obstructive sleep apnea 9. Hyperlipidemia 10. Presumed CAP  I agree with iv Lasix, I would increase to 80 mg iv Q8H as there is no response yet. The patient was supposed to be on full anticoagulation, I assume he was non-compliant or couldn't afford Eliquis as he only got free samples in the past.  I would start heparin drip for now, and decide on anticoagulation before discharge as NOAC contraindicatid with CHD stage 4, however if the patient non-compliant warfarin might be difficult for him.  His PM hasn't been checked over a year, we will call for PM check.  We will order echocardiogram for re-evaluation of systolic and diastolic function.  Mild troponin elevation with flat trend - sec to CHF and CKD stage 4.  Signed, Dorothy Spark, MD, [facility] 01/27/2016, 1:24 PM

## 2016-01-27 NOTE — Plan of Care (Signed)
Problem: Education: Goal: Knowledge of [facility] Education information/materials will improve Outcome: Progressing Discussed importance of wearing Bipap tonight during sleep with patient and his wife

## 2016-01-27 NOTE — Progress Notes (Signed)
*  PRELIMINARY RESULTS* Vascular Ultrasound Lower extremity venous duplex has been completed.  Preliminary findings: No evidence of DVT in visualized veins or baker's cyst.   Landry Mellow, RDMS, RVT  01/27/2016, 10:12 AM

## 2016-01-27 NOTE — Progress Notes (Signed)
Patient transported from ER to 2C04 via BIPAP.  Patient set up in room. Tolerating BIPAP well at this time.

## 2016-01-28 ENCOUNTER — Inpatient Hospital Stay (HOSPITAL_COMMUNITY): Payer: Medicare Other

## 2016-01-28 DIAGNOSIS — N189 Chronic kidney disease, unspecified: Secondary | ICD-10-CM

## 2016-01-28 DIAGNOSIS — I509 Heart failure, unspecified: Secondary | ICD-10-CM

## 2016-01-28 DIAGNOSIS — R001 Bradycardia, unspecified: Secondary | ICD-10-CM | POA: Insufficient documentation

## 2016-01-28 DIAGNOSIS — N179 Acute kidney failure, unspecified: Secondary | ICD-10-CM

## 2016-01-28 DIAGNOSIS — I483 Typical atrial flutter: Secondary | ICD-10-CM | POA: Insufficient documentation

## 2016-01-28 LAB — CBC
HEMATOCRIT: 45.2 % (ref 39.0–52.0)
Hemoglobin: 15 g/dL (ref 13.0–17.0)
MCH: 33.1 pg (ref 26.0–34.0)
MCHC: 33.2 g/dL (ref 30.0–36.0)
MCV: 99.8 fL (ref 78.0–100.0)
PLATELETS: 135 10*3/uL — AB (ref 150–400)
RBC: 4.53 MIL/uL (ref 4.22–5.81)
RDW: 15.5 % (ref 11.5–15.5)
WBC: 16.5 10*3/uL — ABNORMAL HIGH (ref 4.0–10.5)

## 2016-01-28 LAB — ECHOCARDIOGRAM COMPLETE
Height: 71 in
Weight: 5209.6 oz

## 2016-01-28 LAB — GLUCOSE, CAPILLARY
Glucose-Capillary: 178 mg/dL — ABNORMAL HIGH (ref 65–99)
Glucose-Capillary: 186 mg/dL — ABNORMAL HIGH (ref 65–99)
Glucose-Capillary: 180 mg/dL — ABNORMAL HIGH (ref 65–99)
Glucose-Capillary: 209 mg/dL — ABNORMAL HIGH (ref 65–99)

## 2016-01-28 LAB — BASIC METABOLIC PANEL
ANION GAP: 12 (ref 5–15)
BUN: 33 mg/dL — AB (ref 6–20)
CHLORIDE: 96 mmol/L — AB (ref 101–111)
CO2: 28 mmol/L (ref 22–32)
CREATININE: 2.23 mg/dL — AB (ref 0.61–1.24)
Calcium: 8 mg/dL — ABNORMAL LOW (ref 8.9–10.3)
GFR, EST AFRICAN AMERICAN: 35 mL/min — AB (ref >60)
GFR, EST NON AFRICAN AMERICAN: 30 mL/min — AB (ref >60)
GLUCOSE: 157 mg/dL — AB (ref 65–99)
POTASSIUM: 3.7 mmol/L (ref 3.5–5.1)
Sodium: 136 mmol/L (ref 135–145)

## 2016-01-28 LAB — HEPARIN LEVEL (UNFRACTIONATED): Heparin Unfractionated: 0.21 IU/mL — ABNORMAL LOW (ref 0.30–0.70)

## 2016-01-28 MED ORDER — HEPARIN BOLUS VIA INFUSION
1500.0000 [IU] | Freq: Once | INTRAVENOUS | Status: AC
  Administered 2016-01-28: 1500 [IU] via INTRAVENOUS
  Filled 2016-01-28: qty 1500

## 2016-01-28 MED ORDER — WARFARIN - PHARMACIST DOSING INPATIENT
Freq: Every day | Status: DC
  Administered 2016-01-30 – 2016-02-03 (×3)

## 2016-01-28 MED ORDER — WARFARIN SODIUM 5 MG PO TABS
7.5000 mg | ORAL_TABLET | Freq: Once | ORAL | Status: AC
Start: 2016-01-28 — End: 2016-01-28
  Administered 2016-01-28: 7.5 mg via ORAL
  Filled 2016-01-28: qty 1.5

## 2016-01-28 MED ORDER — INSULIN ASPART 100 UNIT/ML ~~LOC~~ SOLN
0.0000 [IU] | Freq: Three times a day (TID) | SUBCUTANEOUS | Status: DC
  Administered 2016-01-28 – 2016-01-29 (×4): 2 [IU] via SUBCUTANEOUS
  Administered 2016-01-29: 3 [IU] via SUBCUTANEOUS

## 2016-01-28 MED ORDER — CARVEDILOL 25 MG PO TABS
25.0000 mg | ORAL_TABLET | Freq: Two times a day (BID) | ORAL | Status: DC
  Administered 2016-01-28 – 2016-02-06 (×19): 25 mg via ORAL
  Filled 2016-01-28 (×20): qty 1

## 2016-01-28 NOTE — Discharge Instructions (Signed)

## 2016-01-28 NOTE — Progress Notes (Signed)
Put pt on venti mask after I finish treatment

## 2016-01-28 NOTE — Progress Notes (Signed)
  Echocardiogram 2D Echocardiogram has been performed.  Kurt Cross 01/28/2016, 3:57 PM

## 2016-01-28 NOTE — Progress Notes (Signed)
SUBJECTIVE: The patient is doing well today.  Breathing is a little better.  At this time, he denies chest pain or any new concerns.  Kurt Cross Kitchen azithromycin  500 mg Intravenous Q0600  . carvedilol  25 mg Oral BID WC  . cefTRIAXone (ROCEPHIN) IVPB 1 gram/50 mL D5W  1 g Intravenous Q0600  . furosemide  80 mg Intravenous Q8H  . guaiFENesin  600 mg Oral BID  . insulin aspart  0-9 Units Subcutaneous TID WC  . insulin NPH Human  20 Units Subcutaneous QHS  . ipratropium  0.5 mg Nebulization Q6H  . rosuvastatin  10 mg Oral q1800  . sodium chloride flush  3 mL Intravenous Q12H  . spironolactone  25 mg Oral BID   . heparin 1,800 Units/hr (01/28/16 0938)    OBJECTIVE: Physical Exam: Filed Vitals:   01/28/16 0500 01/28/16 0600 01/28/16 0725 01/28/16 0831  BP:  134/74  118/67  Pulse: 70 70  70  Temp:    98.3 F (36.8 C)  TempSrc:    Oral  Resp: 20 20  22   Height:      Weight:      SpO2: 97% 95% 96% 91%    Intake/Output Summary (Last 24 hours) at 01/28/16 1235 Last data filed at 01/28/16 0900  Gross per 24 hour  Intake 1333.6 ml  Output   2200 ml  Net -866.4 ml    Telemetry reveals atrial flutter with V pacing  GEN- The patient is morbidly obese appearing, alert and oriented x 3 today.   Head- normocephalic, atraumatic Eyes-  Sclera clear, conjunctiva pink Ears- hearing intact Oropharynx- clear Neck- supple,   Lungs- decreased Bs at bases, normal work of breathing Heart- Regular rate and rhythm (paced) GI- soft, NT, ND, + BS Extremities- no clubbing, cyanosis, + 2 dependant edema Skin- no rash or lesion Psych- euthymic mood, full affect Neuro- strength and sensation are intact  LABS: Basic Metabolic Panel:  Recent Labs  01/26/16 2106 01/28/16 0705  NA 139 136  K 3.8 3.7  CL 97* 96*  CO2 27 28  GLUCOSE 136* 157*  BUN 25* 33*  CREATININE 2.42* 2.23*  CALCIUM 9.1 8.0*   Liver Function Tests:  Recent Labs  01/26/16 2106  AST 26  ALT 16*  ALKPHOS 28*  BILITOT  1.2  PROT 7.2  ALBUMIN 2.7*   No results for input(s): LIPASE, AMYLASE in the last 72 hours. CBC:  Recent Labs  01/26/16 2106 01/27/16 2227 01/28/16 0705  WBC 12.3* 18.4* 16.5*  NEUTROABS 10.2*  --   --   HGB 16.9 14.9 15.0  HCT 49.8 44.1 45.2  MCV 99.4 100.2* 99.8  PLT 147* 124* 135*   Cardiac Enzymes:  Recent Labs  01/27/16 0241 01/27/16 0714 01/27/16 1159  TROPONINI 0.15* 0.14* 0.11*   RADIOLOGY: Dg Chest Port 1 View  01/26/2016  CLINICAL DATA:  severe short of breath for 3 days EXAM: PORTABLE CHEST 1 VIEW COMPARISON:  None. FINDINGS: LEFT-sided pacemaker overlies stable enlarged cardiac silhouette. There is central venous congestion. No focal infiltrate. No pneumothorax. IMPRESSION: Cardiomegaly and central venous congestion. Electronically Signed   By: Suzy Bouchard M.D.   On: 01/26/2016 21:59   MDT biv pacemaker interrogated and functioning normal Battery is good and lead parameters are stable Programmed VVI <40% BIV paced.  I have therefore increased lower pacing rate from 50 to 70 bpm Primary rhythm appears to be atrial flutter LV threshold is chronically elevated  ASSESSMENT AND PLAN:  Kurt Cross is a 65 y.o. male with morbid obesity, chronic atrial flutter, chronic systolic dysfunction, and OSA now admitted with respiratory failure.  He has had difficulty with noncompliance and infrequent cardiovascular care which has probably made things worse for him.  1. Acute on chronic respiratory failure with hypoxia multifactoral but due to acute on chronic combined systolic and diastolic SOB, exacerbated by renal disease and morbid obesity. - continue current diuresis - not able to add ace inhibitor at this time due to renal failure - consider imdur/ hydralazine for afterload reduction - echo pending  2. Typical atrial flutter Review of ekgs back to 2013 reveals only atrial flutter and not atrial fibrillation.  Chads2vasc score is 4.  He has not been compliant  with follow-up or anticoagulation.  I would advise warfarin with close follow-up in the [place] anticoagulation clinic. Echo to evaluate LA size Could consider atrial flutter ablation down the road if LA size is not enlarged however given noncompliance, he will have to demonstrate that he will be compliant with anticoagulation therapy before we consider this. Will consult pharmacy for initiation of coumadin.  As atrial flutter is chronic, I do not feel that heparin bridge until INR is therapeutic is necessary.  (May be reasonable to keep on heparin for the short term but would not let this delay discharge).  3. Elevated troponin Likely due to demand ischemia Unlikely acute MI Echo pending  4. Morbidy obesity/ Obesity-hypoventilation syndrome Body mass index is 45.43 kg/(m^2). His size significantly impacts his health Weight loss is advised  5. Chronic bradycardia S/p CRT-P device Interrogation reviewed as above Will need to enroll in [facility] at discharge Would benefit from remote monitoring if he will comply  6. Acute on chronic renal failure Continue diuresis and follow  General cardiology to follow  [name], MD 01/28/2016 12:35 PM

## 2016-01-28 NOTE — Progress Notes (Signed)
PROGRESS NOTE    Kurt Cross  [id number] DOB: 09/15/1951 DOA: 01/26/2016 PCP: [name], MD   Outpatient Specialists:     Brief Narrative:  Kurt Cross is a 65 y.o. male with medical history significant of COPD, combined systolic and diastolic heart failure, chronic atrial fibrillation/flutter in the past requiring ventricular pacemaker secondary to severe bradycardia not on anticoagulation possibly due to noncompliance. sleep apnea, morbid obesity, diabetes, hypertension and abdominal aortic aneurysm   Assessment & Plan:   Active Problems:   Diabetes mellitus, insulin dependent (IDDM), uncontrolled (HCC)   Obstructive sleep apnea   Hypertension   Atrial fibrillation/flutter   Hypoxia   Acute on chronic combined systolic and diastolic CHF, NYHA class 2 (HCC)   Acute on chronic renal failure (HCC)   Acute on chronic respiratory failure (HCC)   Cellulitis   Acute on chronic respiratory failure with hypoxia (HCC)   Elevated troponin   Bradycardia   Typical atrial flutter (HCC)   Acute on chronic respiratory failure with hypoxia multifactoral - IV lasix - per cards: consider imdur/ hydralazine for afterload reduction - echo  DM -SSI NPH   atrial flutter Per cardiology:  consider atrial flutter ablation down the road if LA size is not enlarged however given noncompliance, he will have to demonstrate that he will be compliant with anticoagulation therapy before we consider this. -coumadin.: does not need heparin bridge   Elevated troponin Likely due to demand ischemia  Morbidy obesity/ Obesity-hypoventilation syndrome Encourage life style changes  Chronic bradycardia -has pacemaker   Acute on chronic renal failure IV lasix and monitor  DVT prophylaxis:  SQ Heparin  Code Status: Full Code   Family Communication:  Disposition Plan:     Consultants:   cards  Procedures:     Antimicrobials:      Subjective: Breathing better C/o  hurting all over- asking for percocet  Objective: Filed Vitals:   01/28/16 0500 01/28/16 0600 01/28/16 0725 01/28/16 0831  BP:  134/74  118/67  Pulse: 70 70  70  Temp:    98.3 F (36.8 C)  TempSrc:    Oral  Resp: 20 20  22   Height:      Weight:      SpO2: 97% 95% 96% 91%    Intake/Output Summary (Last 24 hours) at 01/28/16 1323 Last data filed at 01/28/16 0900  Gross per 24 hour  Intake 1333.6 ml  Output   2200 ml  Net -866.4 ml   Filed Weights   01/27/16 0140 01/28/16 0441  Weight: 144.108 kg (317 lb 11.2 oz) 147.691 kg (325 lb 9.6 oz)    Examination:  General exam: Appears calm and comfortable  Respiratory system: diminished, no wheezing Cardiovascular system: irr and occasionally fast Gastrointestinal system:  Normal bowel sounds heard, obese Central nervous system: Alert and oriented. No focal neurological deficits. Extremities: Symmetric 5 x 5 power. Skin: chronic changes to B/L LE Psychiatry: Judgement and insight appear normal. Mood & affect appropriate.     Data Reviewed: I have personally reviewed following labs and imaging studies  CBC:  Recent Labs Lab 01/26/16 2106 01/27/16 2227 01/28/16 0705  WBC 12.3* 18.4* 16.5*  NEUTROABS 10.2*  --   --   HGB 16.9 14.9 15.0  HCT 49.8 44.1 45.2  MCV 99.4 100.2* 99.8  PLT 147* 124* [id number]*   Basic Metabolic Panel:  Recent Labs Lab 01/26/16 2106 01/28/16 0705  NA 139 136  K 3.8 3.7  CL 97* 96*  CO2 27 28  GLUCOSE 136* 157*  BUN 25* 33*  CREATININE 2.42* 2.23*  CALCIUM 9.1 8.0*   GFR: Estimated Creatinine Clearance: 50.7 mL/min (by C-G formula based on Cr of 2.23). Liver Function Tests:  Recent Labs Lab 01/26/16 2106  AST 26  ALT 16*  ALKPHOS 28*  BILITOT 1.2  PROT 7.2  ALBUMIN 2.7*   No results for input(s): LIPASE, AMYLASE in the last 168 hours. No results for input(s): AMMONIA in the last 168 hours. Coagulation Profile: No results for input(s): INR, PROTIME in the last 168  hours. Cardiac Enzymes:  Recent Labs Lab 01/26/16 2134 01/27/16 0241 01/27/16 0714 01/27/16 1159  TROPONINI 0.10* 0.15* 0.14* 0.11*   BNP (last 3 results) No results for input(s): PROBNP in the last 8760 hours. HbA1C: No results for input(s): HGBA1C in the last 72 hours. CBG:  Recent Labs Lab 01/27/16 1221 01/27/16 1746 01/27/16 2159 01/28/16 0829 01/28/16 1214  GLUCAP 254* 175* 174* 178* 186*   Lipid Profile: No results for input(s): CHOL, HDL, LDLCALC, TRIG, CHOLHDL, LDLDIRECT in the last 72 hours. Thyroid Function Tests: No results for input(s): TSH, T4TOTAL, FREET4, T3FREE, THYROIDAB in the last 72 hours. Anemia Panel: No results for input(s): VITAMINB12, FOLATE, FERRITIN, TIBC, IRON, RETICCTPCT in the last 72 hours. Urine analysis:    Component Value Date/Time   COLORURINE YELLOW 01/27/2016 1205   APPEARANCEUR CLOUDY* 01/27/2016 1205   LABSPEC 1.016 01/27/2016 1205   PHURINE 5.5 01/27/2016 1205   GLUCOSEU NEGATIVE 01/27/2016 1205   HGBUR LARGE* 01/27/2016 1205   [place] 01/27/2016 1205   [place] 01/27/2016 1205   PROTEINUR >300* 01/27/2016 1205   UROBILINOGEN 0.2 11/13/2013 0630   NITRITE NEGATIVE 01/27/2016 1205   LEUKOCYTESUR SMALL* 01/27/2016 1205    Recent Results (from the past 240 hour(s))  MRSA PCR Screening     Status: None   Collection Time: 01/27/16  1:27 AM  Result Value Ref Range Status   MRSA by PCR NEGATIVE NEGATIVE Final    Comment:        The GeneXpert MRSA Assay (FDA approved for NASAL specimens only), is one component of a comprehensive MRSA colonization surveillance program. It is not intended to diagnose MRSA infection nor to guide or monitor treatment for MRSA infections.       Anti-infectives    Start     Dose/Rate Route Frequency Ordered Stop   01/28/16 0000  vancomycin (VANCOCIN) 1,750 mg in sodium chloride 0.9 % 500 mL IVPB  Status:  Discontinued     1,750 mg 250 mL/hr over 120 Minutes  Intravenous Every 24 hours 01/27/16 0157 01/27/16 1128   01/27/16 0200  cefTRIAXone (ROCEPHIN) 1 g in dextrose 5 % 50 mL IVPB     1 g 100 mL/hr over 30 Minutes Intravenous Daily 01/27/16 0143     01/27/16 0200  azithromycin (ZITHROMAX) 500 mg in dextrose 5 % 250 mL IVPB     500 mg 250 mL/hr over 60 Minutes Intravenous Daily 01/27/16 0143     01/27/16 0200  vancomycin (VANCOCIN) 2,500 mg in sodium chloride 0.9 % 500 mL IVPB     2,500 mg 250 mL/hr over 120 Minutes Intravenous  Once 01/27/16 0157 01/27/16 [id number]       Radiology Studies: Dg Chest Port 1 View  01/26/2016  CLINICAL DATA:  severe short of breath for 3 days EXAM: PORTABLE CHEST 1 VIEW COMPARISON:  None. FINDINGS: LEFT-sided pacemaker overlies stable enlarged cardiac silhouette. There is central venous congestion.  No focal infiltrate. No pneumothorax. IMPRESSION: Cardiomegaly and central venous congestion. Electronically Signed   By: Suzy Bouchard M.D.   On: 01/26/2016 21:59        Scheduled Meds: . azithromycin  500 mg Intravenous Q0600  . carvedilol  25 mg Oral BID WC  . cefTRIAXone (ROCEPHIN) IVPB 1 gram/50 mL D5W  1 g Intravenous Q0600  . furosemide  80 mg Intravenous Q8H  . guaiFENesin  600 mg Oral BID  . insulin aspart  0-9 Units Subcutaneous TID WC  . insulin NPH Human  20 Units Subcutaneous QHS  . ipratropium  0.5 mg Nebulization Q6H  . rosuvastatin  10 mg Oral q1800  . sodium chloride flush  3 mL Intravenous Q12H  . spironolactone  25 mg Oral BID  . warfarin  7.5 mg Oral ONCE-1800  . Warfarin - Pharmacist Dosing Inpatient   Does not apply q1800   Continuous Infusions: . heparin 1,800 Units/hr (01/28/16 0938)     LOS: 2 days    Time spent: 35 min    [place], DO [facility] Pager [phone]  If 7PM-7AM, please contact night-coverage [email or web address] Password [email or web address] 01/28/2016, 1:23 PM

## 2016-01-28 NOTE — Plan of Care (Signed)
Problem: Education: Goal: Knowledge of [facility] Education information/materials will improve Outcome: Progressing Discussed with patient and on-call MD about pacemaker not possibly picking up.  See EPIC for charted vital signs.  Patient had no complaint of chest pain or heart racing.

## 2016-01-28 NOTE — Progress Notes (Addendum)
ANTICOAGULATION CONSULT NOTE - Follow-up Consult  Pharmacy Consult for heparin Indication: atrial fibrillation  No Known Allergies  Patient Measurements: Height: 5\' 11"  (180.3 cm) Weight: (!) 325 lb 9.6 oz (147.691 kg) IBW/kg (Calculated) : 75.3 Heparin Dosing Weight: 110kg  Vital Signs: Temp: 98.8 F (37.1 C) (04/23 0400) Temp Source: Oral (04/23 0400) BP: 134/74 mmHg (04/23 0600) Pulse Rate: 70 (04/23 0600)  Labs:  Recent Labs  01/26/16 2106  01/27/16 0241 01/27/16 0714 01/27/16 1159 01/27/16 2227 01/28/16 0705  HGB 16.9  --   --   --   --  14.9 15.0  HCT 49.8  --   --   --   --  44.1 45.2  PLT 147*  --   --   --   --  124* 135*  HEPARINUNFRC  --   --   --   --   --  0.30 0.21*  CREATININE 2.42*  --   --   --   --   --  2.23*  TROPONINI  --   < > 0.15* 0.14* 0.11*  --   --   < > = values in this interval not displayed.  Estimated Creatinine Clearance: 50.7 mL/min (by C-G formula based on Cr of 2.23).  Assessment: 64 YOM on heparin for Afib. Heparin level is now sub-therapeutic on 1600 units/hr. Hgb stable. Plt low stable. No bleeding noted per RN  Goal of Therapy:  Heparin level 0.3-0.7 units/ml Monitor platelets by anticoagulation protocol: Yes   Plan:  Heparin bolus of 1500 units, then increase heparin infusion to 1800 units/hr F/u 6 hr heparin level Follow for long term AC plans  Albertina Parr, PharmD., BCPS Clinical Pharmacist Pager [phone]  Addendum: Now adding Coumadin. Will give Coumadin 7.5 mg once and order daily PT/INR. No significant medication interactions noted.   Albertina Parr, PharmD., BCPS Clinical Pharmacist Pager [phone]

## 2016-01-29 LAB — CBC
HCT: 41.6 % (ref 39.0–52.0)
Hemoglobin: 13.6 g/dL (ref 13.0–17.0)
MCH: 32.9 pg (ref 26.0–34.0)
MCHC: 32.7 g/dL (ref 30.0–36.0)
MCV: 100.7 fL — ABNORMAL HIGH (ref 78.0–100.0)
Platelets: 132 10*3/uL — ABNORMAL LOW (ref 150–400)
RBC: 4.13 MIL/uL — AB (ref 4.22–5.81)
RDW: 15.3 % (ref 11.5–15.5)
WBC: 13.6 10*3/uL — AB (ref 4.0–10.5)

## 2016-01-29 LAB — BASIC METABOLIC PANEL
ANION GAP: 12 (ref 5–15)
BUN: 29 mg/dL — AB (ref 6–20)
CO2: 30 mmol/L (ref 22–32)
Calcium: 8 mg/dL — ABNORMAL LOW (ref 8.9–10.3)
Chloride: 94 mmol/L — ABNORMAL LOW (ref 101–111)
Creatinine, Ser: 2.05 mg/dL — ABNORMAL HIGH (ref 0.61–1.24)
GFR, EST AFRICAN AMERICAN: 38 mL/min — AB (ref >60)
GFR, EST NON AFRICAN AMERICAN: 33 mL/min — AB (ref >60)
Glucose, Bld: 166 mg/dL — ABNORMAL HIGH (ref 65–99)
POTASSIUM: 3.4 mmol/L — AB (ref 3.5–5.1)
SODIUM: 136 mmol/L (ref 135–145)

## 2016-01-29 LAB — PROTIME-INR
INR: 1.28 (ref 0.00–1.49)
PROTHROMBIN TIME: 16.1 s — AB (ref 11.6–15.2)

## 2016-01-29 LAB — GLUCOSE, CAPILLARY
GLUCOSE-CAPILLARY: 182 mg/dL — AB (ref 65–99)
Glucose-Capillary: 103 mg/dL — ABNORMAL HIGH (ref 65–99)
Glucose-Capillary: 240 mg/dL — ABNORMAL HIGH (ref 65–99)
Glucose-Capillary: 206 mg/dL — ABNORMAL HIGH (ref 65–99)

## 2016-01-29 LAB — HEMOGLOBIN A1C
Hgb A1c MFr Bld: 7.4 % — ABNORMAL HIGH (ref 4.8–5.6)
MEAN PLASMA GLUCOSE: 166 mg/dL

## 2016-01-29 MED ORDER — WARFARIN SODIUM 5 MG PO TABS
7.5000 mg | ORAL_TABLET | Freq: Once | ORAL | Status: AC
  Administered 2016-01-29: 7.5 mg via ORAL
  Filled 2016-01-29: qty 1.5

## 2016-01-29 MED ORDER — POTASSIUM CHLORIDE CRYS ER 20 MEQ PO TBCR
40.0000 meq | EXTENDED_RELEASE_TABLET | Freq: Once | ORAL | Status: AC
  Administered 2016-01-29: 40 meq via ORAL
  Filled 2016-01-29: qty 2

## 2016-01-29 MED ORDER — AZITHROMYCIN 500 MG PO TABS
500.0000 mg | ORAL_TABLET | Freq: Every day | ORAL | Status: DC
Start: 2016-01-30 — End: 2016-01-31
  Administered 2016-01-30: 500 mg via ORAL
  Filled 2016-01-29: qty 1

## 2016-01-29 NOTE — Progress Notes (Signed)
The patients pulse rate showing 69 and 12 lead EKG showing 70 bpm.  The telemetry ECG monitoring showing a rate of 166.  Informed Central Telemetry and MD on call.

## 2016-01-29 NOTE — Care Management Important Message (Signed)
Important Message  Patient Details  Name: Kurt Cross MRN: [id number] Date of Birth: [birth year]   Medicare Important Message Given:  Yes    [name] May 01/29/2016, 12:16 PM

## 2016-01-29 NOTE — Progress Notes (Signed)
Tried pt on 4 l n/c - desats down to 88 % - also noted to have sleep apnea placed back on ventimask at 40 % right after lunch

## 2016-01-29 NOTE — Progress Notes (Signed)
SUBJECTIVE: He feels better this am. No chest pain. Breathing is better.   BP 105/62 mmHg  Pulse 71  Temp(Src) 97.6 F (36.4 C) (Oral)  Resp 18  Ht 5\' 11"  (1.803 m)  Wt 321 lb (145.605 kg)  BMI 44.79 kg/m2  SpO2 89%  Intake/Output Summary (Last 24 hours) at 01/29/16 0725 Last data filed at 01/29/16 0617  Gross per 24 hour  Intake 1874.73 ml  Output   3425 ml  Net -1550.27 ml    PHYSICAL EXAM General: Well developed, well nourished, in no acute distress. Alert and oriented x 3.  Psych:  Good affect, responds appropriately Neck: No JVD. No masses noted.  Lungs: Clear bilaterally with no wheezes or rhonci noted.  Heart: RRR with no murmurs noted. Abdomen: Bowel sounds are present. Soft, non-tender.  Extremities: 2+ bilateral lower extremity edema.   LABS: Basic Metabolic Panel:  Recent Labs  01/28/16 0705 01/29/16 0316  NA 136 136  K 3.7 3.4*  CL 96* 94*  CO2 28 30  GLUCOSE 157* 166*  BUN 33* 29*  CREATININE 2.23* 2.05*  CALCIUM 8.0* 8.0*   CBC:  Recent Labs  01/26/16 2106  01/28/16 0705 01/29/16 0316  WBC 12.3*  < > 16.5* 13.6*  NEUTROABS 10.2*  --   --   --   HGB 16.9  < > 15.0 13.6  HCT 49.8  < > 45.2 41.6  MCV 99.4  < > 99.8 100.7*  PLT 147*  < > 135* 132*  < > = values in this interval not displayed. Cardiac Enzymes:  Recent Labs  01/27/16 0241 01/27/16 0714 01/27/16 1159  TROPONINI 0.15* 0.14* 0.11*   Current Meds: . azithromycin  500 mg Intravenous Q0600  . carvedilol  25 mg Oral BID WC  . cefTRIAXone (ROCEPHIN) IVPB 1 gram/50 mL D5W  1 g Intravenous Q0600  . furosemide  80 mg Intravenous Q8H  . guaiFENesin  600 mg Oral BID  . insulin aspart  0-9 Units Subcutaneous TID WC  . insulin NPH Human  20 Units Subcutaneous QHS  . ipratropium  0.5 mg Nebulization Q6H  . rosuvastatin  10 mg Oral q1800  . sodium chloride flush  3 mL Intravenous Q12H  . spironolactone  25 mg Oral BID  . Warfarin - Pharmacist Dosing Inpatient   Does not  apply q1800   Echo 01/28/16: Left ventricle: The cavity size was normal. There was mild  concentric hypertrophy. Systolic function was moderately to  severely reduced. The estimated ejection fraction was in the  range of 30% to 35%. The study is not technically sufficient to  allow evaluation of LV diastolic function. - Aortic valve: There was mild regurgitation. - Mitral valve: There was mild to moderate regurgitation directed  centrally. - Left atrium: The atrium was severely dilated. - Right atrium: The atrium was moderately dilated. - Tricuspid valve: There was moderate regurgitation. - Pulmonary arteries: Systolic pressure was moderately increased.  PA peak pressure: 55 mm Hg (S).  ASSESSMENT AND PLAN: 65 y.o. male with morbid obesity, chronic atrial flutter, chronic systolic dysfunction, medical non-compliance and OSA who was admitted with respiratory failure.   1. Acute on chronic respiratory failure with hypoxia: Multifactoral due to acute on chronic combined systolic and diastolic CHF exacerbated by renal disease and morbid obesity. He is negative 2.4 liters during the hospitalization and 1.5 liters last 24 hours. He is still volume overloaded. Would continue diuresis with IV Lasix today. Renal function is stable  this am. Not able to add ace inhibitor at this time due to renal failure. Consider imdur/ hydralazine for afterload reduction. Echo with LVEF=30-35%. This is unchanged from 2015. His long term follow up will be with Dr. Meda Coffee in our office.   2. Typical atrial flutter: Chads2vasc score is 4. He has not been compliant with follow-up or anticoagulation. Continue warfarin with close follow-up in the [place] anticoagulation clinic. Could consider atrial flutter ablation down the road if LA size is not enlarged however given noncompliance, he will have to demonstrate that he will be compliant with anticoagulation therapy before we consider this.  3. Elevated  troponin: Minimal elevation with flat trend in setting of acute CHF and renal failure and without chest pain. Likely due to demand ischemia.   4. Morbidy obesity/ Obesity-hypoventilation syndrome: His size significantly impacts his health. Weight loss is advised  5. Chronic bradycardia: S/p CRT-P device. Will need to enroll in [place] clinic at discharge. Would benefit from remote monitoring if he will comply   6. Acute on chronic kidney disease, stage 3: Continue diuresis and follow     MCALHANY,CHRISTOPHER  4/24/20177:25 AM

## 2016-01-29 NOTE — Progress Notes (Signed)
ANTICOAGULATION CONSULT NOTE  Pharmacy Consult for heparin Indication: atrial fibrillation  No Known Allergies  Patient Measurements: Height: 5\' 11"  (180.3 cm) Weight: (!) 321 lb (145.605 kg) IBW/kg (Calculated) : 75.3 Heparin Dosing Weight: 110kg  Vital Signs: Temp: 96.7 F (35.9 C) (04/24 0745) Temp Source: Axillary (04/24 0745) BP: 105/57 mmHg (04/24 0800) Pulse Rate: 68 (04/24 0800)  Labs:  Recent Labs  01/26/16 2106  01/27/16 0241 01/27/16 0714 01/27/16 1159 01/27/16 2227 01/28/16 0705 01/29/16 0316  HGB 16.9  --   --   --   --  14.9 15.0 13.6  HCT 49.8  --   --   --   --  44.1 45.2 41.6  PLT 147*  --   --   --   --  124* 135* 132*  LABPROT  --   --   --   --   --   --   --  16.1*  INR  --   --   --   --   --   --   --  1.28  HEPARINUNFRC  --   --   --   --   --  0.30 0.21*  --   CREATININE 2.42*  --   --   --   --   --  2.23* 2.05*  TROPONINI  --   < > 0.15* 0.14* 0.11*  --   --   --   < > = values in this interval not displayed.  Estimated Creatinine Clearance: 54.6 mL/min (by C-G formula based on Cr of 2.05).  Assessment: 65 year old male with AFib, not on anticoagulation PTA, previously tried anticoag but was noncompliant. Pt was on a heparin gtt - now discontinued. Warfarin was started yesterday, INR 1.28 as expected.   Goal of Therapy:  Heparin level 0.3-0.7 units/ml Monitor platelets by anticoagulation protocol: Yes   Plan:  -warfarin 7.5 mg po x1 -daily INR     Hughes Better, PharmD, BCPS Clinical Pharmacist 01/29/2016 11:50 AM

## 2016-01-29 NOTE — Progress Notes (Signed)
PROGRESS NOTE    Kurt Cross  [id number] DOB: [birth year] DOA: 01/26/2016 PCP: [name], MD   Outpatient Specialists:     Brief Narrative:  Kurt Cross is a 65 y.o. male with medical history significant of COPD, combined systolic and diastolic heart failure, chronic atrial fibrillation/flutter in the past requiring ventricular pacemaker secondary to severe bradycardia not on anticoagulation possibly due to noncompliance. sleep apnea, morbid obesity, diabetes, hypertension and abdominal aortic aneurysm.     Assessment & Plan:   Active Problems:   Diabetes mellitus, insulin dependent (IDDM), uncontrolled (HCC)   Obstructive sleep apnea   Hypertension   Atrial fibrillation/flutter   Hypoxia   Acute on chronic combined systolic and diastolic CHF, NYHA class 2 (HCC)   Acute on chronic renal failure (HCC)   Acute on chronic respiratory failure (HCC)   Cellulitis   Acute on chronic respiratory failure with hypoxia (HCC)   Elevated troponin   Bradycardia   Typical atrial flutter (HCC)   Acute on chronic respiratory failure with hypoxia multifactoral - IV lasix - per cards: consider imdur/ hydralazine for afterload reduction - echo- EF 35%  DM -SSI NPH   atrial flutter Per cardiology:  consider atrial flutter ablation down the road if LA size is not enlarged however given noncompliance, he will have to demonstrate that he will be compliant with anticoagulation therapy before we consider this. -coumadin.: does not need heparin bridge   Elevated troponin Likely due to demand ischemia  Morbidy obesity/ Obesity-hypoventilation syndrome Encourage life style changes  Chronic bradycardia -has pacemaker   Acute on chronic renal failure stage 3 IV lasix and monitor -improving  DVT prophylaxis:  SQ Heparin  Code Status: Full Code   Family Communication:  Disposition Plan: ?transfer to floor in AM-- On ventimask     Consultants:   cards  Procedures:       Antimicrobials:      Subjective: Breathing improved  Objective: Filed Vitals:   01/29/16 0745 01/29/16 0755 01/29/16 0800 01/29/16 1158  BP: 111/63  105/57 122/76  Pulse: 70  68 69  Temp: 96.7 F (35.9 C)   97.3 F (36.3 C)  TempSrc: Axillary   Axillary  Resp: 23  13 20   Height:      Weight:      SpO2: 92% 95% 95% 90%    Intake/Output Summary (Last 24 hours) at 01/29/16 1217 Last data filed at 01/29/16 1124  Gross per 24 hour  Intake 1964.73 ml  Output   4100 ml  Net -2135.27 ml   Filed Weights   01/27/16 0140 01/28/16 0441 01/29/16 0500  Weight: 144.108 kg (317 lb 11.2 oz) 147.691 kg (325 lb 9.6 oz) 145.605 kg (321 lb)    Examination:  General exam: Appears calm and comfortable  Respiratory system: diminished, no wheezing Cardiovascular system: irr and occasionally fast Gastrointestinal system:  Normal bowel sounds heard, obese Central nervous system: Alert and oriented. No focal neurological deficits. Extremities: Symmetric 5 x 5 power. Skin: chronic changes to B/L LE Psychiatry: Judgement and insight appear normal. Mood & affect appropriate.     Data Reviewed: I have personally reviewed following labs and imaging studies  CBC:  Recent Labs Lab 01/26/16 2106 01/27/16 2227 01/28/16 0705 01/29/16 0316  WBC 12.3* 18.4* 16.5* 13.6*  NEUTROABS 10.2*  --   --   --   HGB 16.9 14.9 15.0 13.6  HCT 49.8 44.1 45.2 41.6  MCV 99.4 100.2* 99.8 100.7*  PLT 147* 124* 135*  [id number]*   Basic Metabolic Panel:  Recent Labs Lab 01/26/16 2106 01/28/16 0705 01/29/16 0316  NA 139 136 136  K 3.8 3.7 3.4*  CL 97* 96* 94*  CO2 27 28 30   GLUCOSE 136* 157* 166*  BUN 25* 33* 29*  CREATININE 2.42* 2.23* 2.05*  CALCIUM 9.1 8.0* 8.0*   GFR: Estimated Creatinine Clearance: 54.6 mL/min (by C-G formula based on Cr of 2.05). Liver Function Tests:  Recent Labs Lab 01/26/16 2106  AST 26  ALT 16*  ALKPHOS 28*  BILITOT 1.2  PROT 7.2  ALBUMIN 2.7*   No  results for input(s): LIPASE, AMYLASE in the last 168 hours. No results for input(s): AMMONIA in the last 168 hours. Coagulation Profile:  Recent Labs Lab 01/29/16 0316  INR 1.28   Cardiac Enzymes:  Recent Labs Lab 01/26/16 2134 01/27/16 0241 01/27/16 0714 01/27/16 1159  TROPONINI 0.10* 0.15* 0.14* 0.11*   BNP (last 3 results) No results for input(s): PROBNP in the last 8760 hours. HbA1C:  Recent Labs  01/27/16 0241  HGBA1C 7.4*   CBG:  Recent Labs Lab 01/28/16 0829 01/28/16 1214 01/28/16 1720 01/28/16 2151 01/29/16 0742  GLUCAP 178* 186* 180* 209* 103*   Lipid Profile: No results for input(s): CHOL, HDL, LDLCALC, TRIG, CHOLHDL, LDLDIRECT in the last 72 hours. Thyroid Function Tests: No results for input(s): TSH, T4TOTAL, FREET4, T3FREE, THYROIDAB in the last 72 hours. Anemia Panel: No results for input(s): VITAMINB12, FOLATE, FERRITIN, TIBC, IRON, RETICCTPCT in the last 72 hours. Urine analysis:    Component Value Date/Time   COLORURINE YELLOW 01/27/2016 1205   APPEARANCEUR CLOUDY* 01/27/2016 1205   LABSPEC 1.016 01/27/2016 1205   PHURINE 5.5 01/27/2016 1205   GLUCOSEU NEGATIVE 01/27/2016 1205   HGBUR LARGE* 01/27/2016 1205   [place] 01/27/2016 1205   [place] 01/27/2016 1205   PROTEINUR >300* 01/27/2016 1205   UROBILINOGEN 0.2 11/13/2013 0630   NITRITE NEGATIVE 01/27/2016 1205   LEUKOCYTESUR SMALL* 01/27/2016 1205    Recent Results (from the past 240 hour(s))  MRSA PCR Screening     Status: None   Collection Time: 01/27/16  1:27 AM  Result Value Ref Range Status   MRSA by PCR NEGATIVE NEGATIVE Final    Comment:        The GeneXpert MRSA Assay (FDA approved for NASAL specimens only), is one component of a comprehensive MRSA colonization surveillance program. It is not intended to diagnose MRSA infection nor to guide or monitor treatment for MRSA infections.       Anti-infectives    Start     Dose/Rate Route  Frequency Ordered Stop   01/28/16 0000  vancomycin (VANCOCIN) 1,750 mg in sodium chloride 0.9 % 500 mL IVPB  Status:  Discontinued     1,750 mg 250 mL/hr over 120 Minutes Intravenous Every 24 hours 01/27/16 0157 01/27/16 1128   01/27/16 0200  cefTRIAXone (ROCEPHIN) 1 g in dextrose 5 % 50 mL IVPB     1 g 100 mL/hr over 30 Minutes Intravenous Daily 01/27/16 0143     01/27/16 0200  azithromycin (ZITHROMAX) 500 mg in dextrose 5 % 250 mL IVPB     500 mg 250 mL/hr over 60 Minutes Intravenous Daily 01/27/16 0143     01/27/16 0200  vancomycin (VANCOCIN) 2,500 mg in sodium chloride 0.9 % 500 mL IVPB     2,500 mg 250 mL/hr over 120 Minutes Intravenous  Once 01/27/16 0157 01/27/16 [id number]       Radiology Studies: No  results found.      Scheduled Meds: . azithromycin  500 mg Intravenous Q0600  . carvedilol  25 mg Oral BID WC  . cefTRIAXone (ROCEPHIN) IVPB 1 gram/50 mL D5W  1 g Intravenous Q0600  . furosemide  80 mg Intravenous Q8H  . guaiFENesin  600 mg Oral BID  . insulin aspart  0-9 Units Subcutaneous TID WC  . insulin NPH Human  20 Units Subcutaneous QHS  . ipratropium  0.5 mg Nebulization Q6H  . rosuvastatin  10 mg Oral q1800  . sodium chloride flush  3 mL Intravenous Q12H  . spironolactone  25 mg Oral BID  . warfarin  7.5 mg Oral ONCE-1800  . Warfarin - Pharmacist Dosing Inpatient   Does not apply q1800   Continuous Infusions:     LOS: 3 days    Time spent: 35 min    [place], DO [facility] Pager [phone]  If 7PM-7AM, please contact night-coverage [email or web address] Password [facility] 01/29/2016, 12:17 PM

## 2016-01-29 NOTE — Plan of Care (Signed)
Problem: Education: Goal: Knowledge of [place] [facility] Education information/materials will improve Outcome: Progressing Discussed importance of not eating or drinking while wearing the Bipap and risk for aspiration.

## 2016-01-29 NOTE — Plan of Care (Signed)
Problem: Limited Adherence to Nutrition-Related Recommendations (NB-1.6) Goal: Nutrition education Formal process to instruct or train a patient/client in a skill or to impart knowledge to help patients/clients voluntarily manage or modify food choices and eating behavior to maintain or improve health. Outcome: Completed/Met Date Met:  01/29/16  RD consulted for nutrition education regarding diabetes.     Lab Results  Component Value Date    HGBA1C 7.4* 01/27/2016    RD provided "Carbohydrate Counting for People with Diabetes" handout from the Academy of [employer] and Dietetics. Discussed different food groups and their effects on blood sugar, emphasizing carbohydrate-containing foods. Provided list of carbohydrates and recommended serving sizes of common foods.  Discussed importance of controlled and consistent carbohydrate intake throughout the day. Provided examples of ways to balance meals/snacks and encouraged intake of high-fiber, whole grain complex carbohydrates. Teach back method used.  Expect fair compliance.  Body mass index is 44.79 kg/(m^2). Pt meets criteria for Obesity Class III based on current BMI.  Current diet order is Carbohydrate Modified.  Patient eating lunch upon visit.  Labs and medications reviewed. No further nutrition interventions warranted at this time. If additional nutrition issues arise, please re-consult RD.  Arthur Holms, RD, LDN Pager #: [phone] After-Hours Pager #: [phone]

## 2016-01-30 LAB — CBC
HCT: 44 % (ref 39.0–52.0)
Hemoglobin: 14.3 g/dL (ref 13.0–17.0)
MCH: 32.9 pg (ref 26.0–34.0)
MCHC: 32.5 g/dL (ref 30.0–36.0)
MCV: 101.4 fL — AB (ref 78.0–100.0)
PLATELETS: 157 10*3/uL (ref 150–400)
RBC: 4.34 MIL/uL (ref 4.22–5.81)
RDW: 15.1 % (ref 11.5–15.5)
WBC: 12.6 10*3/uL — AB (ref 4.0–10.5)

## 2016-01-30 LAB — PROTIME-INR
INR: 1.29 (ref 0.00–1.49)
Prothrombin Time: 16.2 seconds — ABNORMAL HIGH (ref 11.6–15.2)

## 2016-01-30 LAB — GLUCOSE, CAPILLARY
Glucose-Capillary: 177 mg/dL — ABNORMAL HIGH (ref 65–99)
Glucose-Capillary: 266 mg/dL — ABNORMAL HIGH (ref 65–99)
Glucose-Capillary: 218 mg/dL — ABNORMAL HIGH (ref 65–99)
Glucose-Capillary: 236 mg/dL — ABNORMAL HIGH (ref 65–99)

## 2016-01-30 LAB — BASIC METABOLIC PANEL
ANION GAP: 10 (ref 5–15)
BUN: 26 mg/dL — AB (ref 6–20)
CALCIUM: 8.5 mg/dL — AB (ref 8.9–10.3)
CO2: 33 mmol/L — ABNORMAL HIGH (ref 22–32)
Chloride: 92 mmol/L — ABNORMAL LOW (ref 101–111)
Creatinine, Ser: 1.92 mg/dL — ABNORMAL HIGH (ref 0.61–1.24)
GFR calc Af Amer: 41 mL/min — ABNORMAL LOW (ref >60)
GFR, EST NON AFRICAN AMERICAN: 36 mL/min — AB (ref >60)
GLUCOSE: 217 mg/dL — AB (ref 65–99)
Potassium: 3.6 mmol/L (ref 3.5–5.1)
Sodium: 135 mmol/L (ref 135–145)

## 2016-01-30 MED ORDER — INSULIN ASPART 100 UNIT/ML ~~LOC~~ SOLN
0.0000 [IU] | Freq: Every day | SUBCUTANEOUS | Status: DC
Start: 2016-01-30 — End: 2016-02-06
  Administered 2016-01-30 – 2016-02-01 (×3): 2 [IU] via SUBCUTANEOUS
  Administered 2016-02-04: 3 [IU] via SUBCUTANEOUS
  Administered 2016-02-05: 2 [IU] via SUBCUTANEOUS

## 2016-01-30 MED ORDER — WARFARIN SODIUM 5 MG PO TABS
7.5000 mg | ORAL_TABLET | Freq: Once | ORAL | Status: AC
  Administered 2016-01-30: 7.5 mg via ORAL
  Filled 2016-01-30 (×2): qty 1.5

## 2016-01-30 MED ORDER — INSULIN ASPART 100 UNIT/ML ~~LOC~~ SOLN
0.0000 [IU] | Freq: Three times a day (TID) | SUBCUTANEOUS | Status: DC
Start: 2016-01-30 — End: 2016-02-06
  Administered 2016-01-30: 7 [IU] via SUBCUTANEOUS
  Administered 2016-01-30: 11 [IU] via SUBCUTANEOUS
  Administered 2016-01-31: 7 [IU] via SUBCUTANEOUS
  Administered 2016-01-31: 3 [IU] via SUBCUTANEOUS
  Administered 2016-01-31 – 2016-02-01 (×2): 7 [IU] via SUBCUTANEOUS
  Administered 2016-02-01 (×2): 4 [IU] via SUBCUTANEOUS
  Administered 2016-02-02: 11 [IU] via SUBCUTANEOUS
  Administered 2016-02-02: 3 [IU] via SUBCUTANEOUS
  Administered 2016-02-02 – 2016-02-03 (×2): 7 [IU] via SUBCUTANEOUS
  Administered 2016-02-03: 3 [IU] via SUBCUTANEOUS
  Administered 2016-02-03: 7 [IU] via SUBCUTANEOUS
  Administered 2016-02-04 (×2): 4 [IU] via SUBCUTANEOUS
  Administered 2016-02-05: 7 [IU] via SUBCUTANEOUS
  Administered 2016-02-05: 3 [IU] via SUBCUTANEOUS
  Administered 2016-02-05 – 2016-02-06 (×2): 4 [IU] via SUBCUTANEOUS

## 2016-01-30 MED ORDER — ISOSORB DINITRATE-HYDRALAZINE 20-37.5 MG PO TABS
1.0000 | ORAL_TABLET | Freq: Three times a day (TID) | ORAL | Status: DC
  Administered 2016-01-30 – 2016-02-06 (×21): 1 via ORAL
  Filled 2016-01-30 (×22): qty 1

## 2016-01-30 NOTE — Progress Notes (Signed)
PROGRESS NOTE    Kurt Cross  [id number] DOB: 01-26-1951 DOA: 01/26/2016 PCP: [name], MD   Outpatient Specialists:     Brief Narrative:  Kurt Cross is a 65 y.o. male with medical history significant of COPD, combined systolic and diastolic heart failure, chronic atrial fibrillation/flutter in the past requiring ventricular pacemaker secondary to severe bradycardia not on anticoagulation possibly due to noncompliance. sleep apnea, morbid obesity, diabetes, hypertension and abdominal aortic aneurysm.  Found to be volume overloaded and started on IV lasix.     Assessment & Plan:   Active Problems:   Diabetes mellitus, insulin dependent (IDDM), uncontrolled (HCC)   Obstructive sleep apnea   Hypertension   Atrial fibrillation/flutter   Hypoxia   Acute on chronic combined systolic and diastolic CHF, NYHA class 2 (HCC)   Acute on chronic renal failure (HCC)   Acute on chronic respiratory failure (HCC)   Cellulitis   Acute on chronic respiratory failure with hypoxia (HCC)   Elevated troponin   Bradycardia   Typical atrial flutter (HCC)   Acute on chronic respiratory failure with hypoxia multifactoral - IV lasix - per cards: consider imdur/ hydralazine for afterload reduction - echo- EF 35%  DM -SSI NPH   atrial flutter Per cardiology:  consider atrial flutter ablation down the road if LA size is not enlarged however given noncompliance, he will have to demonstrate that he will be compliant with anticoagulation therapy before we consider this. -coumadin.: does not need heparin bridge   Elevated troponin Likely due to demand ischemia  Morbidy obesity/ Obesity-hypoventilation syndrome Encourage life style changes  Chronic bradycardia -has pacemaker   Acute on chronic renal failure stage 3 IV lasix and monitor -improving  DVT prophylaxis:  SQ Heparin  Code Status: Full Code   Family Communication:  Disposition Plan:     Consultants:    cards  Procedures:     Antimicrobials:      Subjective: Breathing improved  Objective: Filed Vitals:   01/30/16 0400 01/30/16 0500 01/30/16 0727 01/30/16 0734  BP: 139/82   168/94  Pulse: 69   76  Temp: 97.4 F (36.3 C)   97.8 F (36.6 C)  TempSrc: Axillary   Oral  Resp: 16   26  Height:      Weight:  143.11 kg (315 lb 8 oz)    SpO2: 97%  97% 97%    Intake/Output Summary (Last 24 hours) at 01/30/16 0924 Last data filed at 01/30/16 0630  Gross per 24 hour  Intake   1190 ml  Output   5275 ml  Net  -4085 ml   Filed Weights   01/28/16 0441 01/29/16 0500 01/30/16 0500  Weight: 147.691 kg (325 lb 9.6 oz) 145.605 kg (321 lb) 143.11 kg (315 lb 8 oz)    Examination:  General exam: sleepy this AM Respiratory system: diminished, no wheezing Cardiovascular system: irr and occasionally fast Gastrointestinal system:  Normal bowel sounds heard, obese Central nervous system: Alert and oriented. No focal neurological deficits. Extremities: Symmetric 5 x 5 power. Skin: chronic changes to B/L LE Psychiatry: Judgement and insight appear normal. Mood & affect appropriate.     Data Reviewed: I have personally reviewed following labs and imaging studies  CBC:  Recent Labs Lab 01/26/16 2106 01/27/16 2227 01/28/16 0705 01/29/16 0316 01/30/16 0356  WBC 12.3* 18.4* 16.5* 13.6* 12.6*  NEUTROABS 10.2*  --   --   --   --   HGB 16.9 14.9 15.0 13.6 14.3  HCT 49.8 44.1 45.2 41.6 44.0  MCV 99.4 100.2* 99.8 100.7* 101.4*  PLT 147* 124* 135* 132* [id number]   Basic Metabolic Panel:  Recent Labs Lab 01/26/16 2106 01/28/16 0705 01/29/16 0316 01/30/16 0356  NA 139 136 136 135  K 3.8 3.7 3.4* 3.6  CL 97* 96* 94* 92*  CO2 27 28 30  33*  GLUCOSE 136* 157* 166* 217*  BUN 25* 33* 29* 26*  CREATININE 2.42* 2.23* 2.05* 1.92*  CALCIUM 9.1 8.0* 8.0* 8.5*   GFR: Estimated Creatinine Clearance: 57.8 mL/min (by C-G formula based on Cr of 1.92). Liver Function Tests:  Recent  Labs Lab 01/26/16 2106  AST 26  ALT 16*  ALKPHOS 28*  BILITOT 1.2  PROT 7.2  ALBUMIN 2.7*   No results for input(s): LIPASE, AMYLASE in the last 168 hours. No results for input(s): AMMONIA in the last 168 hours. Coagulation Profile:  Recent Labs Lab 01/29/16 0316 01/30/16 0356  INR 1.28 1.29   Cardiac Enzymes:  Recent Labs Lab 01/26/16 2134 01/27/16 0241 01/27/16 0714 01/27/16 1159  TROPONINI 0.10* 0.15* 0.14* 0.11*   BNP (last 3 results) No results for input(s): PROBNP in the last 8760 hours. HbA1C: No results for input(s): HGBA1C in the last 72 hours. CBG:  Recent Labs Lab 01/29/16 0742 01/29/16 1159 01/29/16 1701 01/29/16 2142 01/30/16 0733  GLUCAP 103* 182* 240* 206* 177*   Lipid Profile: No results for input(s): CHOL, HDL, LDLCALC, TRIG, CHOLHDL, LDLDIRECT in the last 72 hours. Thyroid Function Tests: No results for input(s): TSH, T4TOTAL, FREET4, T3FREE, THYROIDAB in the last 72 hours. Anemia Panel: No results for input(s): VITAMINB12, FOLATE, FERRITIN, TIBC, IRON, RETICCTPCT in the last 72 hours. Urine analysis:    Component Value Date/Time   COLORURINE YELLOW 01/27/2016 1205   APPEARANCEUR CLOUDY* 01/27/2016 1205   LABSPEC 1.016 01/27/2016 1205   PHURINE 5.5 01/27/2016 1205   GLUCOSEU NEGATIVE 01/27/2016 1205   HGBUR LARGE* 01/27/2016 1205   [place] 01/27/2016 1205   [place] 01/27/2016 1205   PROTEINUR >300* 01/27/2016 1205   UROBILINOGEN 0.2 11/13/2013 0630   NITRITE NEGATIVE 01/27/2016 1205   LEUKOCYTESUR SMALL* 01/27/2016 1205    Recent Results (from the past 240 hour(s))  MRSA PCR Screening     Status: None   Collection Time: 01/27/16  1:27 AM  Result Value Ref Range Status   MRSA by PCR NEGATIVE NEGATIVE Final    Comment:        The GeneXpert MRSA Assay (FDA approved for NASAL specimens only), is one component of a comprehensive MRSA colonization surveillance program. It is not intended to diagnose  MRSA infection nor to guide or monitor treatment for MRSA infections.       Anti-infectives    Start     Dose/Rate Route Frequency Ordered Stop   01/30/16 1000  azithromycin (ZITHROMAX) tablet 500 mg     500 mg Oral Daily 01/29/16 1318     01/28/16 0000  vancomycin (VANCOCIN) 1,750 mg in sodium chloride 0.9 % 500 mL IVPB  Status:  Discontinued     1,750 mg 250 mL/hr over 120 Minutes Intravenous Every 24 hours 01/27/16 0157 01/27/16 1128   01/27/16 0200  cefTRIAXone (ROCEPHIN) 1 g in dextrose 5 % 50 mL IVPB     1 g 100 mL/hr over 30 Minutes Intravenous Daily 01/27/16 0143     01/27/16 0200  azithromycin (ZITHROMAX) 500 mg in dextrose 5 % 250 mL IVPB  Status:  Discontinued  500 mg 250 mL/hr over 60 Minutes Intravenous Daily 01/27/16 0143 01/29/16 1318   01/27/16 0200  vancomycin (VANCOCIN) 2,500 mg in sodium chloride 0.9 % 500 mL IVPB     2,500 mg 250 mL/hr over 120 Minutes Intravenous  Once 01/27/16 0157 01/27/16 [id number]       Radiology Studies: No results found.      Scheduled Meds: . azithromycin  500 mg Oral Daily  . carvedilol  25 mg Oral BID WC  . cefTRIAXone (ROCEPHIN) IVPB 1 gram/50 mL D5W  1 g Intravenous Q0600  . furosemide  80 mg Intravenous Q8H  . guaiFENesin  600 mg Oral BID  . insulin aspart  0-20 Units Subcutaneous TID WC  . insulin aspart  0-5 Units Subcutaneous QHS  . insulin NPH Human  20 Units Subcutaneous QHS  . ipratropium  0.5 mg Nebulization Q6H  . rosuvastatin  10 mg Oral q1800  . sodium chloride flush  3 mL Intravenous Q12H  . spironolactone  25 mg Oral BID  . Warfarin - Pharmacist Dosing Inpatient   Does not apply q1800   Continuous Infusions:     LOS: 4 days    Time spent: 35 min    [place], DO [facility] Pager [phone]  If 7PM-7AM, please contact night-coverage [email or web address] Password [facility] 01/30/2016, 9:24 AM

## 2016-01-30 NOTE — Progress Notes (Deleted)
Inpatient Diabetes Program Recommendations  AACE/ADA: New Consensus Statement on Inpatient Glycemic Control (2015)  Target Ranges:  Prepandial:   less than 140 mg/dL      Peak postprandial:   less than 180 mg/dL (1-2 hours)      Critically ill patients:  140 - 180 mg/dL   Results for IRBIN, KURMAN (MRN [id number]) as of 01/30/2016 09:32  Ref. Range 01/29/2016 07:42 01/29/2016 11:59 01/29/2016 17:01 01/29/2016 21:42  Glucose-Capillary Latest Ref Range: 65-99 mg/dL 103 (H) 182 (H) 240 (H) 206 (H)   Results for MARX, GERSHON (MRN [id number]) as of 01/30/2016 09:32  Ref. Range 01/30/2016 07:33  Glucose-Capillary Latest Ref Range: 65-99 mg/dL 177 (H)    Home DM Meds: NPH insulin- 20 units bid       Humalog 20 units with breakfast/ 20 units with lunch  Current Insulin Orders: NPH insulin- 20 units QHS      Novolog Resistant Correction Scale/ SSI (0-20 units) TID AC + HS      MD- Please consider the following in-hospital insulin adjustments:  Start Novolog Meal Coverage- Novolog 4 units tid with meals (hold if pt eats <50% of meal)      --Will follow patient during hospitalization--  Wyn Quaker RN, MSN, CDE Diabetes Coordinator Inpatient Glycemic Control Team Team Pager: [phone] (8a-5p)

## 2016-01-30 NOTE — Progress Notes (Addendum)
ANTICOAGULATION CONSULT NOTE  Pharmacy Consult for heparin Indication: atrial fibrillation  No Known Allergies  Patient Measurements: Height: 5\' 11"  (180.3 cm) Weight: (!) 315 lb 8 oz (143.11 kg) IBW/kg (Calculated) : 75.3 Heparin Dosing Weight: 110kg  Vital Signs: Temp: 97.8 F (36.6 C) (04/25 0734) Temp Source: Oral (04/25 0734) BP: 168/94 mmHg (04/25 0734) Pulse Rate: 76 (04/25 0734)  Labs:  Recent Labs  01/27/16 1159  01/27/16 2227 01/28/16 0705 01/29/16 0316 01/30/16 0356  HGB  --   < > 14.9 15.0 13.6 14.3  HCT  --   < > 44.1 45.2 41.6 44.0  PLT  --   < > 124* 135* 132* 157  LABPROT  --   --   --   --  16.1* 16.2*  INR  --   --   --   --  1.28 1.29  HEPARINUNFRC  --   --  0.30 0.21*  --   --   CREATININE  --   --   --  2.23* 2.05* 1.92*  TROPONINI 0.11*  --   --   --   --   --   < > = values in this interval not displayed.  Estimated Creatinine Clearance: 57.8 mL/min (by C-G formula based on Cr of 1.92).  Assessment: 65 year old male with AFib, not on anticoagulation PTA, previously tried anticoag but was noncompliant. Pt was on a heparin gtt - now discontinued. Warfarin was started on 4/23, INR 1.29.   Goal of Therapy:  Heparin level 0.3-0.7 units/ml Monitor platelets by anticoagulation protocol: Yes   Plan:  -warfarin 7.5 mg po x1 -daily INR     Hughes Better, PharmD, BCPS Clinical Pharmacist 01/30/2016 10:36 AM

## 2016-01-30 NOTE — Evaluation (Signed)
Physical Therapy Evaluation Patient Details Name: Kurt Cross MRN: [id number] DOB: 1950-10-27 Today's Date: 01/30/2016   History of Present Illness  Kurt Cross is a 65 y.o. male with medical history significant of COPD, combined systolic and diastolic heart failure, chronic atrial fibrillation/flutter in the past requiring ventricular pacemaker secondary to severe bradycardia not on anticoagulation possibly due to noncompliance. sleep apnea, morbid obesity, diabetes, hypertension and abdominal aortic aneurysm. Found to be volume overloaded and started on IV lasix.   Clinical Impression  Pt admitted with above diagnosis. Pt currently with functional limitations due to the deficits listed below (see PT Problem List). Pt was able to transfer to chair with min guard assist of 2 due to poor safety awareness and pt body habitus.  Pt barely cleared armrest prior to sitting down.  Will need SNF prior to d/c home.  Will follow acutely.   Pt will benefit from skilled PT to increase their independence and safety with mobility to allow discharge to the venue listed below.      Follow Up Recommendations SNF;Supervision/Assistance - 24 hour    Equipment Recommendations  Other (comment) (TBA)    Recommendations for Other Services       Precautions / Restrictions Precautions Precautions: Fall Restrictions Weight Bearing Restrictions: No      Mobility  Bed Mobility Overal bed mobility: Independent             General bed mobility comments: Took incr time but pt performed without assist.  Uses momentum  Transfers Overall transfer level: Needs assistance Equipment used: Rolling walker (2 wheeled) Transfers: Sit to/from [facility] Sit to Stand: Min guard;+2 safety/equipment;From elevated surface Stand pivot transfers: Min assist;+2 safety/equipment;From elevated surface       General transfer comment: Pt asking PT and tech to back up so he could get up.  Pt had one  foot inside RW and one outside.  This PT recommended pt place foot inside RW.  Pt attempted to stand with momentum unsuccessfully.  Then pt put foot on inside and with incr time, pt was able to stand to RW pulling up on RW.  Did not assist pt as he requested but was very close guard assist as pt looked very unsteady somewhat throwing his body around into chair.  Very unsafe technique.    Ambulation/Gait                Stairs            Wheelchair Mobility    Modified Rankin (Stroke Patients Only)       Balance Overall balance assessment: Needs assistance;History of Falls Sitting-balance support: No upper extremity supported;Feet supported Sitting balance-Leahy Scale: Fair     Standing balance support: Bilateral upper extremity supported;During functional activity Standing balance-Leahy Scale: Poor Standing balance comment: relies heavily on RW.                              Pertinent Vitals/Pain Pain Assessment: No/denies pain  Pt on 6LO2 with sats 96%.  Other VSS.      Home Living Family/patient expects to be discharged to:: Private residence Living Arrangements: Spouse/significant other Available Help at Discharge: Family;Available PRN/intermittently (wife works FT 1st shift) Type of Home: House Home Access: [place]: One [place]: Environmental consultant - 2 wheels;Cane - single point;Wheelchair - Scientist, product/process development - 4 wheels;Bedside commode (lift chair)  Prior Function Level of Independence: Needs assistance   Gait / Transfers Assistance Needed: used RW- has fallen per pt  ADL's / Homemaking Assistance Needed: wife assist with sponge bathing,socks and pt uses cane to hook around feet        Hand Dominance        Extremity/Trunk Assessment   Upper Extremity Assessment: Defer to OT evaluation           Lower Extremity Assessment: Generalized weakness      Cervical / Trunk Assessment: Normal   Communication   Communication: No difficulties  Cognition Arousal/Alertness: Awake/alert Behavior During Therapy: Impulsive Overall Cognitive Status: Within Functional Limits for tasks assessed                      General Comments      Exercises        Assessment/Plan    PT Assessment Patient needs continued PT services  PT Diagnosis Generalized weakness   PT Problem List Decreased activity tolerance;Decreased balance;Decreased mobility;Decreased knowledge of use of DME;Decreased safety awareness;Decreased knowledge of precautions;Decreased strength  PT Treatment Interventions DME instruction;Gait training;Functional mobility training;Therapeutic activities;Stair training;Therapeutic exercise;Balance training;Patient/family education   PT Goals (Current goals can be found in the Care Plan section) Acute Rehab PT Goals Patient Stated Goal: to get better PT Goal Formulation: With patient Time For Goal Achievement: 02/13/16 Potential to Achieve Goals: Good    Frequency Min 3X/week   Barriers to discharge Decreased caregiver support (wife works)      Building control surveyor of Session Equipment Utilized During Treatment: Gait belt;Oxygen Activity Tolerance: Patient limited by fatigue Patient left: in chair;with call bell/phone within reach;with chair alarm set Nurse Communication: Mobility status         Time: 1131-1148 PT Time Calculation (min) (ACUTE ONLY): 17 min   Charges:   PT Evaluation $PT Eval Moderate Complexity: 1 Procedure     PT G CodesDenice Paradise Feb 27, 2016, 2:58 PM [facility] Acute Rehabilitation [phone] (pager)

## 2016-01-30 NOTE — Progress Notes (Signed)
    Subjective:  Feels better, breathing better. No CP. Sitting up in bed having breakfast.  Objective:  Vital Signs in the last 24 hours: Temp:  [97.3 F (36.3 C)-98.6 F (37 C)] 97.8 F (36.6 C) (04/25 0734) Pulse Rate:  [69-81] 76 (04/25 0734) Resp:  [10-26] 26 (04/25 0734) BP: (122-168)/(72-94) 168/94 mmHg (04/25 0734) SpO2:  [90 %-99 %] 97 % (04/25 0734) FiO2 (%):  [45 %-50 %] 45 % (04/25 0727) Weight:  [315 lb 8 oz (143.11 kg)] 315 lb 8 oz (143.11 kg) (04/25 0500)  Intake/Output from previous day: 04/24 0701 - 04/25 0700 In: 1640 [P.O.:1590; IV Piggyback:50] Out: [id number] [Urine:5275]  Physical Exam: Pt is alert and oriented, pleasant obese male in NAD HEENT: normal Neck: JVP - elevated with HJR to angle of mandible Lungs: CTA bilaterally CV: RRR without murmur or gallop Abd: soft, NT, Positive BS, obese Ext: 2+ pretibial edema Skin: warm/dry no rash   Lab Results:  Recent Labs  01/29/16 0316 01/30/16 0356  WBC 13.6* 12.6*  HGB 13.6 14.3  PLT 132* 157    Recent Labs  01/29/16 0316 01/30/16 0356  NA 136 135  K 3.4* 3.6  CL 94* 92*  CO2 30 33*  GLUCOSE 166* 217*  BUN 29* 26*  CREATININE 2.05* 1.92*    Recent Labs  01/27/16 1159  TROPONINI 0.11*    Tele: Atrial flutter with v-pacing  Assessment/Plan:  1. Acute on chronic hypoxic respiratory failure 2. Atrial flutter, typical  3. Acute on chronic systolic and diastolic heart failure, improving 4. Morbid obesity with obesity-hypoventilation 5. CKD III: stable  Plan for today: continue IV lasix as he is still volume overloaded. Interrogate ICD if not already done. Start imdur/hydralazine.  Sherren Mocha, M.D. 01/30/2016, 10:00 AM

## 2016-01-31 LAB — GLUCOSE, CAPILLARY
GLUCOSE-CAPILLARY: 136 mg/dL — AB (ref 65–99)
Glucose-Capillary: 207 mg/dL — ABNORMAL HIGH (ref 65–99)
Glucose-Capillary: 244 mg/dL — ABNORMAL HIGH (ref 65–99)
Glucose-Capillary: 206 mg/dL — ABNORMAL HIGH (ref 65–99)

## 2016-01-31 LAB — BASIC METABOLIC PANEL
ANION GAP: 10 (ref 5–15)
BUN: 28 mg/dL — ABNORMAL HIGH (ref 6–20)
CALCIUM: 8.8 mg/dL — AB (ref 8.9–10.3)
CO2: 33 mmol/L — ABNORMAL HIGH (ref 22–32)
Chloride: 95 mmol/L — ABNORMAL LOW (ref 101–111)
Creatinine, Ser: 1.91 mg/dL — ABNORMAL HIGH (ref 0.61–1.24)
GFR, EST AFRICAN AMERICAN: 42 mL/min — AB (ref >60)
GFR, EST NON AFRICAN AMERICAN: 36 mL/min — AB (ref >60)
GLUCOSE: 141 mg/dL — AB (ref 65–99)
Potassium: 3.8 mmol/L (ref 3.5–5.1)
SODIUM: 138 mmol/L (ref 135–145)

## 2016-01-31 LAB — CBC
HCT: 43.8 % (ref 39.0–52.0)
Hemoglobin: 14.5 g/dL (ref 13.0–17.0)
MCH: 33.4 pg (ref 26.0–34.0)
MCHC: 33.1 g/dL (ref 30.0–36.0)
MCV: 100.9 fL — ABNORMAL HIGH (ref 78.0–100.0)
PLATELETS: 182 10*3/uL (ref 150–400)
RBC: 4.34 MIL/uL (ref 4.22–5.81)
RDW: 14.9 % (ref 11.5–15.5)
WBC: 14.7 10*3/uL — AB (ref 4.0–10.5)

## 2016-01-31 LAB — PROTIME-INR
INR: 1.32 (ref 0.00–1.49)
PROTHROMBIN TIME: 16.5 s — AB (ref 11.6–15.2)

## 2016-01-31 MED ORDER — COUMADIN BOOK
Freq: Once | Status: AC
  Administered 2016-01-31: 13:00:00
  Filled 2016-01-31: qty 1

## 2016-01-31 MED ORDER — MAGNESIUM HYDROXIDE 400 MG/5ML PO SUSP
30.0000 mL | Freq: Every day | ORAL | Status: DC | PRN
  Administered 2016-01-31: 30 mL via ORAL
  Filled 2016-01-31: qty 30

## 2016-01-31 MED ORDER — INSULIN NPH (HUMAN) (ISOPHANE) 100 UNIT/ML ~~LOC~~ SUSP
24.0000 [IU] | Freq: Every day | SUBCUTANEOUS | Status: DC
  Administered 2016-01-31 – 2016-02-01 (×2): 24 [IU] via SUBCUTANEOUS
  Filled 2016-01-31 (×2): qty 10

## 2016-01-31 MED ORDER — WARFARIN SODIUM 5 MG PO TABS
10.0000 mg | ORAL_TABLET | Freq: Once | ORAL | Status: AC
  Administered 2016-01-31: 10 mg via ORAL
  Filled 2016-01-31: qty 2

## 2016-01-31 MED ORDER — CETYLPYRIDINIUM CHLORIDE 0.05 % MT LIQD
7.0000 mL | Freq: Two times a day (BID) | OROMUCOSAL | Status: DC
  Administered 2016-01-31 – 2016-02-06 (×12): 7 mL via OROMUCOSAL

## 2016-01-31 MED ORDER — WARFARIN VIDEO
Freq: Once | Status: AC
  Administered 2016-01-31: 13:00:00

## 2016-01-31 NOTE — Progress Notes (Signed)
Results for ALIJAH, GLISAN (MRN [id number]) as of 01/31/2016 09:48  Ref. Range 01/30/2016 07:33 01/30/2016 13:25 01/30/2016 17:08 01/30/2016 22:00 01/31/2016 07:58  Glucose-Capillary Latest Ref Range: 65-99 mg/dL 177 (H) 266 (H) 218 (H) 236 (H) 136 (H)  Noted that postprandial blood sugars have been greater than 180 mg/dl.  Recommend adding Novolog 3-4 units TID as meal coverage if postprandial blood sugars continue to be elevated and patient is eating at least 50% of meals. Will continue to follow while in the hospital. Harvel Ricks RN BSN CDE

## 2016-01-31 NOTE — Progress Notes (Signed)
[place] for heparin Indication: atrial fibrillation  No Known Allergies  Patient Measurements: Height: 5\' 11"  (180.3 cm) Weight: (!) 309 lb 1.4 oz (140.2 kg) IBW/kg (Calculated) : 75.3 Heparin Dosing Weight: 110kg  Vital Signs: Temp: 98.7 F (37.1 C) (04/26 1158) Temp Source: Oral (04/26 1158) BP: 92/48 mmHg (04/26 1158) Pulse Rate: 75 (04/26 1158)  Labs:  Recent Labs  01/29/16 0316 01/30/16 0356 01/31/16 0404  HGB 13.6 14.3 14.5  HCT 41.6 44.0 43.8  PLT 132* 157 182  LABPROT 16.1* 16.2* 16.5*  INR 1.28 1.29 1.32  CREATININE 2.05* 1.92* 1.91*    Estimated Creatinine Clearance: 57.5 mL/min (by C-G formula based on Cr of 1.91).  Assessment: 65 year old male with AFib, not on anticoagulation PTA, previously tried anticoag but was noncompliant. There was some confusion last night if the patient actually received his dose of warfarin. INR still low after 3 doses, 1.32.   Goal of Therapy:  Heparin level 0.3-0.7 units/ml Monitor platelets by anticoagulation protocol: Yes   Plan:  -warfarin 10 mg po x1 -daily INR     Hughes Better, PharmD, BCPS Clinical Pharmacist 01/31/2016 1:52 PM

## 2016-01-31 NOTE — Progress Notes (Signed)
Pt refuse NIV for the night. Told pt if change his mind to please let me know. RN aware

## 2016-01-31 NOTE — Progress Notes (Signed)
Physical Therapy Treatment Patient Details Name: Kurt Cross MRN: [id number] DOB: 24-Oct-1950 Today's Date: 01/31/2016    History of Present Illness Kurt Cross is a 65 y.o. male with medical history significant of COPD, combined systolic and diastolic heart failure, chronic atrial fibrillation/flutter in the past requiring ventricular pacemaker secondary to severe bradycardia not on anticoagulation possibly due to noncompliance. sleep apnea, morbid obesity, diabetes, hypertension and abdominal aortic aneurysm. Found to be volume overloaded and started on IV lasix.     PT Comments    Pt admitted with above diagnosis. Pt currently with functional limitations due to balance and endurance deficits. Pt was able to ambulate with chair follow but with poor safety and need at times for min assist with ambulation and mod assist for sit to stand.  Continues to need PT and will need SNF. Sats 90-93% on 5LO2 with ambulation today.  Other VSS   Pt will benefit from skilled PT to increase their independence and safety with mobility to allow discharge to the venue listed below.    Follow Up Recommendations  SNF;Supervision/Assistance - 24 hour     Equipment Recommendations  Other (comment) (TBA)    Recommendations for Other Services       Precautions / Restrictions Precautions Precautions: Fall Restrictions Weight Bearing Restrictions: No    Mobility  Bed Mobility Overal bed mobility: Independent             General bed mobility comments: Took incr time but pt performed without assist.  Uses momentum  Transfers Overall transfer level: Needs assistance Equipment used: Rolling walker (2 wheeled) Transfers: Sit to/from Stand Sit to Stand: Min assist;+2 physical assistance;From elevated surface         General transfer comment: Pt attempted to stand with momentum unsuccessfully a few times having to raise bed way up to make pt successful.  Pt needed mod assist.  Later in session,  pt needed mod assist to get up from recliner.    Ambulation/Gait Ambulation/Gait assistance: Min assist;+2 safety/equipment Ambulation Distance (Feet): 45 Feet (30 feet and then 15 feet with 5 min rest break) Assistive device: Rolling walker (2 wheeled) Gait Pattern/deviations: Step-to pattern;Decreased stride length;Decreased step length - right;Decreased step length - left;Decreased dorsiflexion - right;Decreased dorsiflexion - left;Shuffle;Ataxic;Antalgic;Leaning posteriorly;Trunk flexed;Wide base of support   Gait velocity interpretation: Below normal speed for age/gender General Gait Details: Pt has to incr hip flexion to swing LEs through.  Pt very unsteady at times needing min assist.  Pt with poor coordination and flexes more and more as he fatigues.  Pt needed cues for safety and followed with chair with pt sitting without full warning needing controlled descent as pt plopped back and doesnt reach back.     Stairs            Wheelchair Mobility    Modified Rankin (Stroke Patients Only)       Balance Overall balance assessment: Needs assistance;History of Falls Sitting-balance support: No upper extremity supported;Feet supported Sitting balance-Leahy Scale: Fair     Standing balance support: Bilateral upper extremity supported;During functional activity Standing balance-Leahy Scale: Poor Standing balance comment: relies heavily on RW which worsens as pt fatigues.                     Cognition Arousal/Alertness: Awake/alert Behavior During Therapy: Impulsive Overall Cognitive Status: Within Functional Limits for tasks assessed  Exercises General Exercises - Lower Extremity Ankle Circles/Pumps: AROM;Both;5 reps;Seated Long Arc Quad: AROM;Both;10 reps;Seated Hip Flexion/Marching: AROM;Both;10 reps;Seated    General Comments        Pertinent Vitals/Pain Pain Assessment: No/denies pain    Home Living                       Prior Function            PT Goals (current goals can now be found in the care plan section) Progress towards PT goals: Progressing toward goals    Frequency  Min 3X/week    PT Plan Current plan remains appropriate    Co-evaluation             End of Session Equipment Utilized During Treatment: Gait belt;Oxygen Activity Tolerance: Patient limited by fatigue Patient left: in chair;with call bell/phone within reach;with chair alarm set     Time: [id number] PT Time Calculation (min) (ACUTE ONLY): 26 min  Charges:  $Gait Training: 8-22 mins $Therapeutic Exercise: 8-22 mins                    G Codes:      Levante Simones, Arrie Aran F 02/27/16, 2:09 PM [employer] Acute Rehabilitation [phone] (pager)

## 2016-01-31 NOTE — Progress Notes (Signed)
PROGRESS NOTE    Kurt Cross  [id number] DOB: July 11, 1951 DOA: 01/26/2016 PCP: Kurt Gravel, MD   Brief Narrative:  Kurt Cross is a 65 y.o. male with medical history significant of COPD, combined systolic and diastolic heart failure, chronic atrial fibrillation/flutter in the past requiring ventricular pacemaker secondary to severe bradycardia not on anticoagulation possibly due to noncompliance. sleep apnea, morbid obesity, diabetes, hypertension and abdominal aortic aneurysm.  Found to be volume overloaded and started on IV lasix.     Assessment & Plan:   Active Problems:   Diabetes mellitus, insulin dependent (IDDM), uncontrolled (HCC)   Obstructive sleep apnea   Hypertension   Atrial fibrillation/flutter   Hypoxia   Acute on chronic combined systolic and diastolic CHF, NYHA class 2 (HCC)   Acute on chronic renal failure (HCC)   Acute on chronic respiratory failure (HCC)   Cellulitis   Acute on chronic respiratory failure with hypoxia (HCC)   Elevated troponin   Bradycardia   Typical atrial flutter (HCC)  Acute on chronic respiratory failure with hypoxia multifactoral due to  acute on chronic combined systolic and diastolic CHF, baseline morbid obesity, obstructive sleep apnea. - Proving with IV diuresis - Patient  has been refusing BiPAP, will discontinue. - Continue with  oxygen as needed  Acute on chronic combined systolic and diastolic CHF - 2-D echo EF 30-35% - Continue with IV diuresis, managed by cardiology, -8,396CC since admission - ACBE I or ARB given her renal failure - Started on Imdur/hydralazine for after load reduction - continue  with Coreg and Aldactone  DM -SSI, CBGs slightly uncontrolled, increase insulin NPH from 20-24  Atrial flutter - Per cardiology:  consider atrial flutter ablation down the road if LA size is not enlarged however given noncompliance, he will have to demonstrate that he will be compliant with anticoagulation therapy before we  consider this. - Chads2vasc  score of 4, on  Warfarin, dosed by pharmacy   Elevated troponin Likely due to demand ischemia  Morbidy obesity/ OSA Encourage life style changes  Chronic bradycardia -has pacemaker   Acute on chronic renal failure stage 3 IV lasix and monitor -improving  DVT prophylaxis:  SQ Heparin  Code Status: Full Code   Family Communication: None at bedside  Disposition Plan: We'll need SNF placement when stable    Consultants:   cards  Procedures:     Antimicrobials:      Subjective: Breathing improved  Objective: Filed Vitals:   01/31/16 0440 01/31/16 0500 01/31/16 0740 01/31/16 0759  BP: 132/97   106/65  Pulse: 70   71  Temp: 98.9 F (37.2 C)   98.7 F (37.1 C)  TempSrc: Oral   Oral  Resp: 12   17  Height:      Weight:  140.2 kg (309 lb 1.4 oz)    SpO2: 97%  97% 90%    Intake/Output Summary (Last 24 hours) at 01/31/16 1009 Last data filed at 01/31/16 0843  Gross per 24 hour  Intake   1010 ml  Output   3775 ml  Net  -2765 ml   Filed Weights   01/29/16 0500 01/30/16 0500 01/31/16 0500  Weight: 145.605 kg (321 lb) 143.11 kg (315 lb 8 oz) 140.2 kg (309 lb 1.4 oz)    Examination:  General exam: Laying comfortable in bed Respiratory system: diminished, no wheezing Cardiovascular system: irr and occasionally fast Gastrointestinal system:  Normal bowel sounds heard, obese Central nervous system: Alert and oriented. No focal  neurological deficits. Extremities: Symmetric 5 x 5 power. Skin: chronic changes to B/L LE Psychiatry: Judgement and insight appear normal. Mood & affect appropriate.     Data Reviewed: I have personally reviewed following labs and imaging studies  CBC:  Recent Labs Lab 01/26/16 2106 01/27/16 2227 01/28/16 0705 01/29/16 0316 01/30/16 0356 01/31/16 0404  WBC 12.3* 18.4* 16.5* 13.6* 12.6* 14.7*  NEUTROABS 10.2*  --   --   --   --   --   HGB 16.9 14.9 15.0 13.6 14.3 14.5  HCT 49.8 44.1  45.2 41.6 44.0 43.8  MCV 99.4 100.2* 99.8 100.7* 101.4* 100.9*  PLT 147* 124* 135* 132* 157 [id number]   Basic Metabolic Panel:  Recent Labs Lab 01/26/16 2106 01/28/16 0705 01/29/16 0316 01/30/16 0356 01/31/16 0404  NA 139 136 136 135 138  K 3.8 3.7 3.4* 3.6 3.8  CL 97* 96* 94* 92* 95*  CO2 27 28 30  33* 33*  GLUCOSE 136* 157* 166* 217* 141*  BUN 25* 33* 29* 26* 28*  CREATININE 2.42* 2.23* 2.05* 1.92* 1.91*  CALCIUM 9.1 8.0* 8.0* 8.5* 8.8*   GFR: Estimated Creatinine Clearance: 57.5 mL/min (by C-G formula based on Cr of 1.91). Liver Function Tests:  Recent Labs Lab 01/26/16 2106  AST 26  ALT 16*  ALKPHOS 28*  BILITOT 1.2  PROT 7.2  ALBUMIN 2.7*   No results for input(s): LIPASE, AMYLASE in the last 168 hours. No results for input(s): AMMONIA in the last 168 hours. Coagulation Profile:  Recent Labs Lab 01/29/16 0316 01/30/16 0356 01/31/16 0404  INR 1.28 1.29 1.32   Cardiac Enzymes:  Recent Labs Lab 01/26/16 2134 01/27/16 0241 01/27/16 0714 01/27/16 1159  TROPONINI 0.10* 0.15* 0.14* 0.11*   BNP (last 3 results) No results for input(s): PROBNP in the last 8760 hours. HbA1C: No results for input(s): HGBA1C in the last 72 hours. CBG:  Recent Labs Lab 01/30/16 0733 01/30/16 1325 01/30/16 1708 01/30/16 2200 01/31/16 0758  GLUCAP 177* 266* 218* 236* 136*   Lipid Profile: No results for input(s): CHOL, HDL, LDLCALC, TRIG, CHOLHDL, LDLDIRECT in the last 72 hours. Thyroid Function Tests: No results for input(s): TSH, T4TOTAL, FREET4, T3FREE, THYROIDAB in the last 72 hours. Anemia Panel: No results for input(s): VITAMINB12, FOLATE, FERRITIN, TIBC, IRON, RETICCTPCT in the last 72 hours. Urine analysis:    Component Value Date/Time   COLORURINE YELLOW 01/27/2016 1205   APPEARANCEUR CLOUDY* 01/27/2016 1205   LABSPEC 1.016 01/27/2016 1205   PHURINE 5.5 01/27/2016 1205   GLUCOSEU NEGATIVE 01/27/2016 1205   HGBUR LARGE* 01/27/2016 1205   [place] 01/27/2016 1205   [place] 01/27/2016 1205   PROTEINUR >300* 01/27/2016 1205   UROBILINOGEN 0.2 11/13/2013 0630   NITRITE NEGATIVE 01/27/2016 1205   LEUKOCYTESUR SMALL* 01/27/2016 1205    Recent Results (from the past 240 hour(s))  MRSA PCR Screening     Status: None   Collection Time: 01/27/16  1:27 AM  Result Value Ref Range Status   MRSA by PCR NEGATIVE NEGATIVE Final    Comment:        The GeneXpert MRSA Assay (FDA approved for NASAL specimens only), is one component of a comprehensive MRSA colonization surveillance program. It is not intended to diagnose MRSA infection nor to guide or monitor treatment for MRSA infections.       Anti-infectives    Start     Dose/Rate Route Frequency Ordered Stop   01/30/16 1000  azithromycin (ZITHROMAX) tablet 500 mg  500 mg Oral Daily 01/29/16 1318     01/28/16 0000  vancomycin (VANCOCIN) 1,750 mg in sodium chloride 0.9 % 500 mL IVPB  Status:  Discontinued     1,750 mg 250 mL/hr over 120 Minutes Intravenous Every 24 hours 01/27/16 0157 01/27/16 1128   01/27/16 0200  cefTRIAXone (ROCEPHIN) 1 g in dextrose 5 % 50 mL IVPB     1 g 100 mL/hr over 30 Minutes Intravenous Daily 01/27/16 0143     01/27/16 0200  azithromycin (ZITHROMAX) 500 mg in dextrose 5 % 250 mL IVPB  Status:  Discontinued     500 mg 250 mL/hr over 60 Minutes Intravenous Daily 01/27/16 0143 01/29/16 1318   01/27/16 0200  vancomycin (VANCOCIN) 2,500 mg in sodium chloride 0.9 % 500 mL IVPB     2,500 mg 250 mL/hr over 120 Minutes Intravenous  Once 01/27/16 0157 01/27/16 0620       Radiology Studies: No results found.      Scheduled Meds: . azithromycin  500 mg Oral Daily  . carvedilol  25 mg Oral BID WC  . cefTRIAXone (ROCEPHIN) IVPB 1 gram/50 mL D5W  1 g Intravenous Q0600  . furosemide  80 mg Intravenous Q8H  . guaiFENesin  600 mg Oral BID  . insulin aspart  0-20 Units Subcutaneous TID WC  . insulin aspart  0-5 Units Subcutaneous QHS   . insulin NPH Human  20 Units Subcutaneous QHS  . ipratropium  0.5 mg Nebulization Q6H  . isosorbide-hydrALAZINE  1 tablet Oral TID  . rosuvastatin  10 mg Oral q1800  . sodium chloride flush  3 mL Intravenous Q12H  . spironolactone  25 mg Oral BID  . Warfarin - Pharmacist Dosing Inpatient   Does not apply q1800   Continuous Infusions:     LOS: 5 days    Time spent: 46 min    Kurt Chrisley, MD [facility] Pager [phone]  If 7PM-7AM, please contact night-coverage [email or web address] Password [email or web address] 01/31/2016, 10:09 AM

## 2016-01-31 NOTE — Progress Notes (Signed)
Report called to Ginger, RN on 5W. Patient transferred via bed with belongings including cell phone and clothing.  Patient's meal tray was sent with him. CCMD notified of transfer, as patient will be on telemetry.  Milford Cage, RN

## 2016-01-31 NOTE — Progress Notes (Signed)
Tilda Franco, RN for report

## 2016-01-31 NOTE — Progress Notes (Signed)
SUBJECTIVE: No chest pain. Breathing is improved.   Tele: atrial flutter  BP 132/97 mmHg  Pulse 70  Temp(Src) 98.9 F (37.2 C) (Oral)  Resp 12  Ht 5\' 11"  (1.803 m)  Wt 309 lb 1.4 oz (140.2 kg)  BMI 43.13 kg/m2  SpO2 97%  Intake/Output Summary (Last 24 hours) at 01/31/16 0713 Last data filed at 01/31/16 [id number]  Gross per 24 hour  Intake   1370 ml  Output   3025 ml  Net  -1655 ml    PHYSICAL EXAM General: Well developed, well nourished, in no acute distress. Alert and oriented x 3.  Psych:  Good affect, responds appropriately Neck: No JVD. No masses noted.  Lungs: Clear bilaterally with no wheezes or rhonci noted.  Heart: RRR with no murmurs noted. Abdomen: Bowel sounds are present. Soft, non-tender.  Extremities: 1-2+ left lower extremity edema, Trace RLE edema  LABS: Basic Metabolic Panel:  Recent Labs  01/30/16 0356 01/31/16 0404  NA 135 138  K 3.6 3.8  CL 92* 95*  CO2 33* 33*  GLUCOSE 217* 141*  BUN 26* 28*  CREATININE 1.92* 1.91*  CALCIUM 8.5* 8.8*   CBC:  Recent Labs  01/30/16 0356 01/31/16 0404  WBC 12.6* 14.7*  HGB 14.3 14.5  HCT 44.0 43.8  MCV 101.4* 100.9*  PLT 157 182   Current Meds: . azithromycin  500 mg Oral Daily  . carvedilol  25 mg Oral BID WC  . cefTRIAXone (ROCEPHIN) IVPB 1 gram/50 mL D5W  1 g Intravenous Q0600  . furosemide  80 mg Intravenous Q8H  . guaiFENesin  600 mg Oral BID  . insulin aspart  0-20 Units Subcutaneous TID WC  . insulin aspart  0-5 Units Subcutaneous QHS  . insulin NPH Human  20 Units Subcutaneous QHS  . ipratropium  0.5 mg Nebulization Q6H  . isosorbide-hydrALAZINE  1 tablet Oral TID  . rosuvastatin  10 mg Oral q1800  . sodium chloride flush  3 mL Intravenous Q12H  . spironolactone  25 mg Oral BID  . Warfarin - Pharmacist Dosing Inpatient   Does not apply q1800     ASSESSMENT AND PLAN: 65 y.o. male with morbid obesity, chronic atrial flutter, chronic systolic dysfunction, medical non-compliance and  OSA who was admitted with respiratory failure.   1. Acute on chronic respiratory failure with hypoxia: Multifactoral due to acute on chronic combined systolic and diastolic CHF exacerbated by renal disease and morbid obesity. He is negative 7.6 liters during the hospitalization and 1.6 liters last 24 hours. Much improved but still with evidence of volume overloaded. Would continue diuresis with IV Lasix today but would move out to tele unit. Renal function is stable this am. Not able to add ace inhibitor at this time due to renal failure. He has been started on imdur/ hydralazine for afterload reduction. Echo with LVEF=30-35%. This is unchanged from 2015. His long term follow up will be with Dr. Meda Coffee in our office.   2. Typical atrial flutter: Chads2vasc score is 4. He has not been compliant with follow-up or anticoagulation. Continue warfarin with close follow-up in the [place] anticoagulation clinic. Could consider atrial flutter ablation down the road if LA size is not enlarged however given noncompliance, he will have to demonstrate that he will be compliant with anticoagulation therapy before we consider this.  3. Elevated troponin: Minimal elevation with flat trend in setting of acute CHF and renal failure and without chest pain. Likely due to  demand ischemia.   4. Morbidy obesity/ Obesity-hypoventilation syndrome: His size significantly impacts his health. Weight loss is advised  5. Chronic bradycardia: S/p CRT-P device. Will need to enroll in [place] [facility] at discharge. Would benefit from remote monitoring if he will comply  6. Acute on chronic kidney disease, stage 3: Stable with diuresis    Kurt Cross  4/26/20177:13 AM

## 2016-02-01 DIAGNOSIS — E1065 Type 1 diabetes mellitus with hyperglycemia: Secondary | ICD-10-CM

## 2016-02-01 DIAGNOSIS — E1022 Type 1 diabetes mellitus with diabetic chronic kidney disease: Secondary | ICD-10-CM

## 2016-02-01 DIAGNOSIS — N183 Chronic kidney disease, stage 3 (moderate): Secondary | ICD-10-CM

## 2016-02-01 LAB — CBC
HCT: 42.6 % (ref 39.0–52.0)
HEMOGLOBIN: 14.3 g/dL (ref 13.0–17.0)
MCH: 33.8 pg (ref 26.0–34.0)
MCHC: 33.6 g/dL (ref 30.0–36.0)
MCV: 100.7 fL — ABNORMAL HIGH (ref 78.0–100.0)
Platelets: 215 10*3/uL (ref 150–400)
RBC: 4.23 MIL/uL (ref 4.22–5.81)
RDW: 14.8 % (ref 11.5–15.5)
WBC: 16 10*3/uL — ABNORMAL HIGH (ref 4.0–10.5)

## 2016-02-01 LAB — BASIC METABOLIC PANEL
ANION GAP: 11 (ref 5–15)
BUN: 28 mg/dL — ABNORMAL HIGH (ref 6–20)
CALCIUM: 8.7 mg/dL — AB (ref 8.9–10.3)
CO2: 33 mmol/L — AB (ref 22–32)
Chloride: 93 mmol/L — ABNORMAL LOW (ref 101–111)
Creatinine, Ser: 1.82 mg/dL — ABNORMAL HIGH (ref 0.61–1.24)
GFR, EST AFRICAN AMERICAN: 44 mL/min — AB (ref >60)
GFR, EST NON AFRICAN AMERICAN: 38 mL/min — AB (ref >60)
Glucose, Bld: 176 mg/dL — ABNORMAL HIGH (ref 65–99)
Potassium: 3.6 mmol/L (ref 3.5–5.1)
Sodium: 137 mmol/L (ref 135–145)

## 2016-02-01 LAB — PROTIME-INR
INR: 1.64 — AB (ref 0.00–1.49)
PROTHROMBIN TIME: 19.4 s — AB (ref 11.6–15.2)

## 2016-02-01 LAB — GLUCOSE, CAPILLARY
GLUCOSE-CAPILLARY: 169 mg/dL — AB (ref 65–99)
GLUCOSE-CAPILLARY: 161 mg/dL — AB (ref 65–99)
GLUCOSE-CAPILLARY: 239 mg/dL — AB (ref 65–99)
GLUCOSE-CAPILLARY: 230 mg/dL — AB (ref 65–99)

## 2016-02-01 MED ORDER — CEPHALEXIN 500 MG PO CAPS
500.0000 mg | ORAL_CAPSULE | Freq: Three times a day (TID) | ORAL | Status: DC
  Administered 2016-02-01 – 2016-02-06 (×16): 500 mg via ORAL
  Filled 2016-02-01 (×16): qty 1

## 2016-02-01 MED ORDER — IPRATROPIUM BROMIDE 0.02 % IN SOLN: 0.5000 mg | RESPIRATORY_TRACT | Status: DC | PRN

## 2016-02-01 MED ORDER — WARFARIN SODIUM 7.5 MG PO TABS
7.5000 mg | ORAL_TABLET | Freq: Once | ORAL | Status: AC
  Administered 2016-02-01: 7.5 mg via ORAL
  Filled 2016-02-01: qty 1

## 2016-02-01 MED ORDER — RISAQUAD PO CAPS
2.0000 | ORAL_CAPSULE | Freq: Every day | ORAL | Status: DC
  Administered 2016-02-01 – 2016-02-06 (×6): 2 via ORAL
  Filled 2016-02-01 (×6): qty 2

## 2016-02-01 NOTE — Progress Notes (Signed)
RT NOTE:  Pt refuses CPAP. Pt will call if he changes his mind

## 2016-02-01 NOTE — Progress Notes (Signed)
PROGRESS NOTE    Kurt Cross  [id number] DOB: 12-Apr-1951 DOA: 01/26/2016 PCP: [name], MD   Brief Narrative:  Kurt Cross is a 66 y.o. male with medical history significant of COPD, combined systolic and diastolic heart failure, chronic atrial fibrillation/flutter in the past requiring ventricular pacemaker secondary to severe bradycardia not on anticoagulation possibly due to noncompliance. sleep apnea, morbid obesity, diabetes, hypertension and abdominal aortic aneurysm.  Found to be volume overloaded and started on IV lasix.     Assessment & Plan:   Active Problems:   Diabetes mellitus, insulin dependent (IDDM), uncontrolled (HCC)   Obstructive sleep apnea   Hypertension   Atrial fibrillation/flutter   Hypoxia   Acute on chronic combined systolic and diastolic CHF, NYHA class 2 (HCC)   Acute on chronic renal failure (HCC)   Acute on chronic respiratory failure (HCC)   Cellulitis   Acute on chronic respiratory failure with hypoxia (HCC)   Elevated troponin   Bradycardia   Typical atrial flutter (HCC)  Acute on chronic respiratory failure with hypoxia multifactoral due to  acute on chronic combined systolic and diastolic CHF, baseline morbid obesity, obstructive sleep apnea. - Improving with IV diuresis - Patient  has been refusing BiPAP, and actually did not need BiPAP over last 48 hours, has been discontinued .. - Continue with  oxygen as needed  Acute on chronic combined systolic and diastolic CHF - 2-D echo EF 30-35% - Continue with IV diuresis, managed by cardiology, -8,833 ml since admission - no ACEI or ARB given her renal failure - Started on Imdur/hydralazine for after load reduction - continue  with Coreg and Aldactone  DM -SSI, CBGs better controlled after increasing increasing insulin NPH from 20-24.  Atrial flutter - Per cardiology:  consider atrial flutter ablation down the road if LA size is not enlarged however given noncompliance, he will have  to demonstrate that he will be compliant with anticoagulation therapy before we consider this. - Chads2vasc  score of 4, on  Warfarin, dosed by pharmacy  Elevated troponin Likely due to demand ischemia   Morbidy obesity/ OSA Encourage life style changes, noncompliant, refusing CPAP.  Chronic bradycardia - has pacemaker  Acute on chronic renal failure stage 3 - We'll function stable on IV Lasix, actually improving , continue to monitor closely   Cellulitis - Left lower leg cellulitis, significant swelling, but venous Doppler negative for DVT - Initially on Rocephin, will transition to by mouth Keflex - patient with significant leukocytosis, but he remains afebrile, nontoxic appearing  DVT prophylaxis:  SQ Heparin  Code Status: Full Code   Family Communication: None at bedside  Disposition Plan: We'll need SNF placement when stable, hopefully in 1-2 days    Consultants:   cards  Procedures:     Antimicrobials:      Subjective: Breathing improved, denies any chest pain.  Objective: Filed Vitals:   01/31/16 2150 02/01/16 0153 02/01/16 0530 02/01/16 0627  BP: 124/62  137/57   Pulse: 70  73   Temp: 98.6 F (37 C)  98.7 F (37.1 C)   TempSrc: Oral  Oral   Resp: 18  20   Height:      Weight:  142.566 kg (314 lb 4.8 oz)    SpO2: 99%  100% 98%    Intake/Output Summary (Last 24 hours) at 02/01/16 1100 Last data filed at 02/01/16 0531  Gross per 24 hour  Intake    600 ml  Output   1400 ml  Net   -800 ml   Filed Weights   01/31/16 0500 01/31/16 1835 02/01/16 0153  Weight: 140.2 kg (309 lb 1.4 oz) 145.5 kg (320 lb 12.3 oz) 142.566 kg (314 lb 4.8 oz)    Examination:  General exam: Laying comfortable in bed Respiratory system: diminished, no wheezing Cardiovascular system: irr and occasionally fast Gastrointestinal system:  Normal bowel sounds heard, obese Central nervous system: Alert and oriented. No focal neurological deficits. Extremities:  Symmetric 5 x 5 power. Skin: chronic changes to B/L LE, lower extremity edema improving, L>R. Psychiatry: Judgement and insight appear normal. Mood & affect appropriate.     Data Reviewed: I have personally reviewed following labs and imaging studies  CBC:  Recent Labs Lab 01/26/16 2106  01/28/16 0705 01/29/16 0316 01/30/16 0356 01/31/16 0404 02/01/16 0625  WBC 12.3*  < > 16.5* 13.6* 12.6* 14.7* 16.0*  NEUTROABS 10.2*  --   --   --   --   --   --   HGB 16.9  < > 15.0 13.6 14.3 14.5 14.3  HCT 49.8  < > 45.2 41.6 44.0 43.8 42.6  MCV 99.4  < > 99.8 100.7* 101.4* 100.9* 100.7*  PLT 147*  < > 135* 132* 157 182 215  < > = values in this interval not displayed. Basic Metabolic Panel:  Recent Labs Lab 01/28/16 0705 01/29/16 0316 01/30/16 0356 01/31/16 0404 02/01/16 0625  NA 136 136 135 138 137  K 3.7 3.4* 3.6 3.8 3.6  CL 96* 94* 92* 95* 93*  CO2 28 30 33* 33* 33*  GLUCOSE 157* 166* 217* 141* 176*  BUN 33* 29* 26* 28* 28*  CREATININE 2.23* 2.05* 1.92* 1.91* 1.82*  CALCIUM 8.0* 8.0* 8.5* 8.8* 8.7*   GFR: Estimated Creatinine Clearance: 60.8 mL/min (by C-G formula based on Cr of 1.82). Liver Function Tests:  Recent Labs Lab 01/26/16 2106  AST 26  ALT 16*  ALKPHOS 28*  BILITOT 1.2  PROT 7.2  ALBUMIN 2.7*   No results for input(s): LIPASE, AMYLASE in the last 168 hours. No results for input(s): AMMONIA in the last 168 hours. Coagulation Profile:  Recent Labs Lab 01/29/16 0316 01/30/16 0356 01/31/16 0404 02/01/16 0625  INR 1.28 1.29 1.32 1.64*   Cardiac Enzymes:  Recent Labs Lab 01/26/16 2134 01/27/16 0241 01/27/16 0714 01/27/16 1159  TROPONINI 0.10* 0.15* 0.14* 0.11*   BNP (last 3 results) No results for input(s): PROBNP in the last 8760 hours. HbA1C: No results for input(s): HGBA1C in the last 72 hours. CBG:  Recent Labs Lab 01/31/16 0758 01/31/16 1156 01/31/16 1602 01/31/16 2155 02/01/16 0823  GLUCAP 136* 207* 244* 206* 169*   Lipid  Profile: No results for input(s): CHOL, HDL, LDLCALC, TRIG, CHOLHDL, LDLDIRECT in the last 72 hours. Thyroid Function Tests: No results for input(s): TSH, T4TOTAL, FREET4, T3FREE, THYROIDAB in the last 72 hours. Anemia Panel: No results for input(s): VITAMINB12, FOLATE, FERRITIN, TIBC, IRON, RETICCTPCT in the last 72 hours. Urine analysis:    Component Value Date/Time   COLORURINE YELLOW 01/27/2016 1205   APPEARANCEUR CLOUDY* 01/27/2016 1205   LABSPEC 1.016 01/27/2016 1205   PHURINE 5.5 01/27/2016 1205   GLUCOSEU NEGATIVE 01/27/2016 1205   HGBUR LARGE* 01/27/2016 1205   [place] 01/27/2016 1205   [place] 01/27/2016 1205   PROTEINUR >300* 01/27/2016 1205   UROBILINOGEN 0.2 11/13/2013 0630   NITRITE NEGATIVE 01/27/2016 1205   LEUKOCYTESUR SMALL* 01/27/2016 1205    Recent Results (from the past 240 hour(s))  MRSA PCR Screening     Status: None   Collection Time: 01/27/16  1:27 AM  Result Value Ref Range Status   MRSA by PCR NEGATIVE NEGATIVE Final    Comment:        The GeneXpert MRSA Assay (FDA approved for NASAL specimens only), is one component of a comprehensive MRSA colonization surveillance program. It is not intended to diagnose MRSA infection nor to guide or monitor treatment for MRSA infections.       Anti-infectives    Start     Dose/Rate Route Frequency Ordered Stop   02/01/16 1400  cephALEXin (KEFLEX) capsule 500 mg     500 mg Oral Every 8 hours 02/01/16 1100     01/30/16 1000  azithromycin (ZITHROMAX) tablet 500 mg  Status:  Discontinued     500 mg Oral Daily 01/29/16 1318 01/31/16 1024   01/28/16 0000  vancomycin (VANCOCIN) 1,750 mg in sodium chloride 0.9 % 500 mL IVPB  Status:  Discontinued     1,750 mg 250 mL/hr over 120 Minutes Intravenous Every 24 hours 01/27/16 0157 01/27/16 1128   01/27/16 0200  cefTRIAXone (ROCEPHIN) 1 g in dextrose 5 % 50 mL IVPB  Status:  Discontinued     1 g 100 mL/hr over 30 Minutes Intravenous Daily  01/27/16 0143 01/31/16 1024   01/27/16 0200  azithromycin (ZITHROMAX) 500 mg in dextrose 5 % 250 mL IVPB  Status:  Discontinued     500 mg 250 mL/hr over 60 Minutes Intravenous Daily 01/27/16 0143 01/29/16 1318   01/27/16 0200  vancomycin (VANCOCIN) 2,500 mg in sodium chloride 0.9 % 500 mL IVPB     2,500 mg 250 mL/hr over 120 Minutes Intravenous  Once 01/27/16 0157 01/27/16 0620       Radiology Studies: No results found.      Scheduled Meds: . acidophilus  2 capsule Oral Daily  . antiseptic oral rinse  7 mL Mouth Rinse BID  . carvedilol  25 mg Oral BID WC  . cephALEXin  500 mg Oral Q8H  . furosemide  80 mg Intravenous Q8H  . guaiFENesin  600 mg Oral BID  . insulin aspart  0-20 Units Subcutaneous TID WC  . insulin aspart  0-5 Units Subcutaneous QHS  . insulin NPH Human  24 Units Subcutaneous QHS  . isosorbide-hydrALAZINE  1 tablet Oral TID  . rosuvastatin  10 mg Oral q1800  . sodium chloride flush  3 mL Intravenous Q12H  . spironolactone  25 mg Oral BID  . Warfarin - Pharmacist Dosing Inpatient   Does not apply q1800   Continuous Infusions:     LOS: 6 days    Time spent: 25 min    Ogden Handlin, MD [facility] Pager [phone]  If 7PM-7AM, please contact night-coverage [email or web address] Password [email or web address] 02/01/2016, 11:00 AM

## 2016-02-01 NOTE — [place] FL2 (Signed)
[place] MEDICAID FL2 LEVEL OF CARE SCREENING TOOL     IDENTIFICATION  Patient Name: Kurt Cross Birthdate: Sep 15, 1951 Sex: male Admission Date (Current Location): 01/26/2016  [facility] and [place] Number:  Herbalist and Address:  [facility] [place], [place] [address], [place], [place] [address]      Provider Number:    Attending Physician Name and Address:  Albertine Patricia, MD  Relative Name and Phone Number:   (spouse susan Mulvey [phone])    Current Level of Care: Hospital Recommended Level of Care: [place] Prior Approval Number:    Date Approved/Denied:   PASRR Number:   [id number]   Discharge Plan: SNF    Current Diagnoses: Patient Active Problem List   Diagnosis Date Noted  . Bradycardia   . Typical atrial flutter ([place])   . Acute on chronic combined systolic and diastolic CHF, NYHA class 2 ([place]) 01/27/2016  . Acute on chronic renal failure ([place]) 01/27/2016  . Acute on chronic respiratory failure ([place]) 01/27/2016  . Cellulitis 01/27/2016  . Acute on chronic respiratory failure with hypoxia ([place]) 01/27/2016  . Elevated troponin 01/27/2016  . Hypoxia 01/26/2016  . Hyperkalemia 11/12/2013  . AKI (acute kidney injury) ([place]) 11/12/2013  . Severe sepsis with acute organ dysfunction ([place]) 11/12/2013  . Short gut syndrome due to distal jejunal SB fistula 10/22/2013  . Atrial fibrillation/flutter 10/06/2013  . 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 Recurrent ventral incisional hernia 08/14/2013  . AAA (abdominal aortic aneurysm)/ 4.5 cm ascending per CT angio 08/01/13 08/12/2013  . Right leg pain 08/11/2013  . Chronic combined systolic and diastolic CHF (congestive heart failure) ([place]) 08/04/2013  . Acute-on-chronic respiratory failure secondary to probable community-acquired pneumonia 08/24/2012  .  Hypertension 06/28/2012  . Diabetes mellitus, insulin dependent (IDDM), uncontrolled ([place]) 09/01/2007  . HYPERLIPIDEMIA 09/01/2007  . Obesity, Class III, BMI 40-49.9 (morbid obesity) ([place]) 09/01/2007  . Obstructive sleep apnea 09/01/2007    Orientation RESPIRATION BLADDER Height & Weight     Self, Time, Situation, Place  Normal Continent Weight: (!) 314 lb 4.8 oz (142.566 kg) Height:  5\' 11"  (180.3 cm)  BEHAVIORAL SYMPTOMS/MOOD NEUROLOGICAL BOWEL NUTRITION STATUS      Continent Diet (regular)  AMBULATORY STATUS COMMUNICATION OF NEEDS Skin   Independent Verbally  (chronic changes to B/L LE)                       Personal Care Assistance Level of Assistance  Bathing, Feeding, Dressing Bathing Assistance: Limited assistance Feeding assistance: Independent Dressing Assistance: Independent     Functional Limitations Info  Sight, Hearing, Speech Sight Info: Adequate Hearing Info: Adequate Speech Info: Adequate    SPECIAL CARE FACTORS FREQUENCY  PT (By licensed PT)     PT Frequency: Min 3X/week              Contractures      Additional Factors Info  Code Status, Allergies Code Status Info: full Allergies Info: NKA           Current Medications (02/01/2016):  This is the current hospital active medication list Current Facility-Administered Medications  Medication Dose Route Frequency Provider Last Rate Last Dose  . 0.9 %  sodium chloride infusion  250 mL Intravenous PRN Toy [name], MD      . acetaminophen (TYLENOL) tablet 650 mg  650 mg  Oral Q4H PRN Toy [name], MD   650 mg at 01/27/16 0230  . albuterol (PROVENTIL) (2.5 MG/3ML) 0.083% nebulizer solution 2.5 mg  2.5 mg Nebulization Q4H PRN Toy [name], MD      . antiseptic oral rinse (CPC / CETYLPYRIDINIUM CHLORIDE 0.05%) solution 7 mL  7 mL Mouth Rinse BID Albertine Patricia, MD   7 mL at 02/01/16 0928  . carvedilol (COREG) tablet 25 mg  25 mg Oral BID WC Jessica U Vann, DO   25 mg at  02/01/16 0926  . furosemide (LASIX) injection 80 mg  80 mg Intravenous Q8H Dorothy Spark, MD   80 mg at 02/01/16 0521  . guaiFENesin (MUCINEX) 12 hr tablet 600 mg  600 mg Oral BID Toy [name], MD   600 mg at 02/01/16 0927  . insulin aspart (novoLOG) injection 0-20 Units  0-20 Units Subcutaneous TID WC Geradine Girt, DO   4 Units at 02/01/16 0926  . insulin aspart (novoLOG) injection 0-5 Units  0-5 Units Subcutaneous QHS Geradine Girt, DO   2 Units at 01/31/16 2226  . insulin NPH Human (HUMULIN N,NOVOLIN N) injection 24 Units  24 Units Subcutaneous QHS Albertine Patricia, MD   24 Units at 01/31/16 2227  . ipratropium (ATROVENT) nebulizer solution 0.5 mg  0.5 mg Nebulization Q4H PRN Sherren Mocha, MD      . isosorbide-hydrALAZINE (BIDIL) 20-37.5 MG per tablet 1 tablet  1 tablet Oral TID Sherren Mocha, MD   1 tablet at 02/01/16 [phone]  . magnesium hydroxide (MILK OF MAGNESIA) suspension 30 mL  30 mL Oral Daily PRN Albertine Patricia, MD   30 mL at 01/31/16 1656  . ondansetron (ZOFRAN) injection 4 mg  4 mg Intravenous Q6H PRN Toy [name], MD      . oxyCODONE-acetaminophen (PERCOCET/ROXICET) 5-325 MG per tablet 1 tablet  1 tablet Oral Q6H PRN Toy [name], MD   1 tablet at 02/01/16 0250   And  . oxyCODONE (Oxy IR/ROXICODONE) immediate release tablet 5 mg  5 mg Oral Q6H PRN Toy [name], MD   5 mg at 02/01/16 0250  . rosuvastatin (CRESTOR) tablet 10 mg  10 mg Oral q1800 Toy [name], MD   10 mg at 01/31/16 1812  . sodium chloride flush (NS) 0.9 % injection 3 mL  3 mL Intravenous Q12H Toy [name], MD   3 mL at 02/01/16 0928  . sodium chloride flush (NS) 0.9 % injection 3 mL  3 mL Intravenous PRN Toy [name], MD   3 mL at 01/31/16 2157  . spironolactone (ALDACTONE) tablet 25 mg  25 mg Oral BID Toy [name], MD   25 mg at 02/01/16 0926  . Warfarin - Pharmacist Dosing Inpatient   Does not apply q1800 Lavenia Atlas, [facility]         Discharge  Medications: Please see discharge summary for a list of discharge medications.  Relevant Imaging Results:  Relevant Lab Results:   Additional Information SS# [id number]  Minta Balsam  BSW intern  [phone]

## 2016-02-01 NOTE — Clinical Social Work Placement (Signed)
   CLINICAL SOCIAL WORK PLACEMENT  NOTE  Date:  02/01/2016  Patient Details  Name: Kurt Cross MRN: [id number] Date of Birth: 26-Apr-1951  Clinical Social Work is seeking post-discharge placement for this patient at the [place] level of care (*CSW will initial, date and re-position this form in  chart as items are completed):      Patient/family provided with [place] Work Department's list of facilities offering this level of care within the geographic area requested by the patient (or if unable, by the patient's family).      Patient/family informed of their freedom to choose among providers that offer the needed level of care, that participate in Medicare, Medicaid or managed care program needed by the patient, have an available bed and are willing to accept the patient.      Patient/family informed of [facility]'s ownership interest in [facility] and [facility], as well as of the fact that they are under no obligation to receive care at these facilities.  PASRR submitted to EDS on       PASRR number received on       Existing PASRR number confirmed on 02/01/16     FL2 transmitted to all facilities in geographic area requested by pt/family on 02/01/16     FL2 transmitted to all facilities within larger geographic area on       Patient informed that his/her managed care company has contracts with or will negotiate with certain facilities, including the following:            Patient/family informed of bed offers received.  Patient chooses bed at       Physician recommends and patient chooses bed at      Patient to be transferred to   on  .  Patient to be transferred to facility by       Patient family notified on   of transfer.  Name of family member notified:        PHYSICIAN Please sign FL2     Additional Comment:    _______________________________________________ Benard Halsted, [place] 02/01/2016, 12:18 PM

## 2016-02-01 NOTE — Progress Notes (Signed)
SUBJECTIVE: No chest pain or dyspnea. Swelling is getting better.   BP 137/57 mmHg  Pulse 73  Temp(Src) 98.7 F (37.1 C) (Oral)  Resp 20  Ht 5\' 11"  (1.803 m)  Wt 314 lb 4.8 oz (142.566 kg)  BMI 43.86 kg/m2  SpO2 98%  Intake/Output Summary (Last 24 hours) at 02/01/16 0913 Last data filed at 02/01/16 0531  Gross per 24 hour  Intake    603 ml  Output   1400 ml  Net   -797 ml    PHYSICAL EXAM General: Obese black male. No acute distress. Alert and oriented x 3.  Psych:  Good affect, responds appropriately Neck: No JVD. No masses noted.  Lungs: Clear bilaterally with no wheezes or rhonci noted.  Heart: RRR with no murmurs noted. Abdomen: Bowel sounds are present. Soft, non-tender.  Extremities: 2+ left lower extremity edema, 1+ right LE edema  LABS: Basic Metabolic Panel:  Recent Labs  01/31/16 0404 02/01/16 0625  NA 138 137  K 3.8 3.6  CL 95* 93*  CO2 33* 33*  GLUCOSE 141* 176*  BUN 28* 28*  CREATININE 1.91* 1.82*  CALCIUM 8.8* 8.7*   CBC:  Recent Labs  01/31/16 0404 02/01/16 0625  WBC 14.7* 16.0*  HGB 14.5 14.3  HCT 43.8 42.6  MCV 100.9* 100.7*  PLT 182 215   Current Meds: . antiseptic oral rinse  7 mL Mouth Rinse BID  . carvedilol  25 mg Oral BID WC  . furosemide  80 mg Intravenous Q8H  . guaiFENesin  600 mg Oral BID  . insulin aspart  0-20 Units Subcutaneous TID WC  . insulin aspart  0-5 Units Subcutaneous QHS  . insulin NPH Human  24 Units Subcutaneous QHS  . isosorbide-hydrALAZINE  1 tablet Oral TID  . rosuvastatin  10 mg Oral q1800  . sodium chloride flush  3 mL Intravenous Q12H  . spironolactone  25 mg Oral BID  . Warfarin - Pharmacist Dosing Inpatient   Does not apply q1800     ASSESSMENT AND PLAN:  65 y.o. male with morbid obesity, chronic atrial flutter, chronic systolic dysfunction, medical non-compliance and OSA who was admitted with respiratory failure.   1. Acute on chronic respiratory failure with hypoxia: Multifactoral  due to acute on chronic combined systolic and diastolic CHF exacerbated by renal disease and morbid obesity. He is negative 8.8 liters during the hospitalization and 0.8 liters last 24 hours. Much improved but still with evidence of volume overloaded. Would continue diuresis with IV Lasix. I think he is nearing a more normal volume state. Renal function is stable this am. Not able to add ace inhibitor at this time due to renal failure. He has been started on imdur/ hydralazine for afterload reduction. Echo with LVEF=30-35%. This is unchanged from 2015. His long term follow up will be with Dr. Meda Coffee in our office.   2. Typical atrial flutter: Chads2vasc score is 4. He has not been compliant with follow-up or anticoagulation. Continue warfarin with close follow-up in the [facility] anticoagulation clinic. Could consider atrial flutter ablation down the road if LA size is not enlarged however given noncompliance, he will have to demonstrate that he will be compliant with anticoagulation therapy before we consider this.  3. Elevated troponin: Minimal elevation with flat trend in setting of acute CHF and renal failure and without chest pain. Likely due to demand ischemia.   4. Morbidy obesity/ Obesity-hypoventilation syndrome: His size significantly impacts his health. Weight  loss is advised  5. Chronic bradycardia: S/p CRT-P device. Will need to enroll in [place] clinic at discharge. Would benefit from remote monitoring if he will comply  6. Acute on chronic kidney disease, stage 3: Stable with diuresis    Asherah Lavoy  4/27/20179:13 AM

## 2016-02-01 NOTE — Progress Notes (Addendum)
ANTICOAGULATION CONSULT NOTE - Follow Up Consult  Pharmacy Consult for Coumadin Indication: atrial fibrillation  No Known Allergies  Patient Measurements: Height: 5\' 11"  (180.3 cm) Weight: (!) 314 lb 4.8 oz (142.566 kg) IBW/kg (Calculated) : 75.3 Heparin Dosing Weight:   Vital Signs: Temp: 98.7 F (37.1 C) (04/27 0530) Temp Source: Oral (04/27 0530) BP: 137/57 mmHg (04/27 0530) Pulse Rate: 73 (04/27 0530)  Labs:  Recent Labs  01/30/16 0356 01/31/16 0404 02/01/16 0625  HGB 14.3 14.5 14.3  HCT 44.0 43.8 42.6  PLT 157 182 215  LABPROT 16.2* 16.5* 19.4*  INR 1.29 1.32 1.64*  CREATININE 1.92* 1.91* 1.82*    Estimated Creatinine Clearance: 60.8 mL/min (by C-G formula based on Cr of 1.82).   Medications:  Scheduled:  . acidophilus  2 capsule Oral Daily  . antiseptic oral rinse  7 mL Mouth Rinse BID  . carvedilol  25 mg Oral BID WC  . cephALEXin  500 mg Oral Q8H  . furosemide  80 mg Intravenous Q8H  . guaiFENesin  600 mg Oral BID  . insulin aspart  0-20 Units Subcutaneous TID WC  . insulin aspart  0-5 Units Subcutaneous QHS  . insulin NPH Human  24 Units Subcutaneous QHS  . isosorbide-hydrALAZINE  1 tablet Oral TID  . rosuvastatin  10 mg Oral q1800  . sodium chloride flush  3 mL Intravenous Q12H  . spironolactone  25 mg Oral BID  . Warfarin - Pharmacist Dosing Inpatient   Does not apply q1800    Assessment: 65yo male with AFib, INR trending toward goal.  Hg and pltc wnl, no bleeding noted.    Goal of Therapy:  INR 2-3 Monitor platelets by anticoagulation protocol: Yes   Plan:  Coumadin 7.5mg  Continue daily INR  Gracy Bruins, PharmD Clinical Pharmacist [facility]

## 2016-02-01 NOTE — Care Management Important Message (Signed)
Important Message  Patient Details  Name: Kurt Cross MRN: [id number] Date of Birth: 05-18-1951   Medicare Important Message Given:  Yes    [name] May 02/01/2016, 1:57 PM

## 2016-02-01 NOTE — Progress Notes (Signed)
Patient not having any trouble with breathing.. Changed to PRN

## 2016-02-01 NOTE — Progress Notes (Signed)
Went to assess pt to see if he needed to void post foley removal and pt had voided on sheets.

## 2016-02-01 NOTE — Clinical Social Work Note (Signed)
Clinical Social Work Assessment  Patient Details  Name: Kurt Cross MRN: [id number] Date of Birth: [birth year]  Date of referral:  02/01/16               Reason for consult:  Facility Placement                Permission sought to share information with:  Facility Sport and exercise psychologist, Case Manager Permission granted to share information::  Yes, Verbal Permission Granted  Name::      [name]  Agency::     Relationship::   (spouse)  Contact Information:   [phone])  Housing/Transportation Living arrangements for the past 2 months:  Single Family Home Source of Information:  Patient Patient Interpreter Needed:  None Criminal Activity/Legal Involvement Pertinent to Current Situation/Hospitalization:  No - Comment as needed Significant Relationships:  Adult Children, Spouse Lives with:  Spouse Do you feel safe going back to the place where you live?  Yes Need for family participation in patient care:  No (Coment)  Care giving concerns:  PT recommend SNF  Social Worker assessment / plan:  BSW intern enters patient room, patient is alert and oriented eating breakfast at bedside. BSW introduce herself and explained PT recommendation and referral process. Patient is agreeable to SNF as long as it is in [place] considering that is where the patient resides. BSW intern will complete FL2 and fax out referral to SNFs.   Employment status:  Retired Surveyor, [employer] PT Recommendations:  [place] / Referral to community resources:  Acute Rehab  Patient/Family's Response to care:  Patient has a good response to care. Patient/Family's Understanding of and Emotional Response to Diagnosis, Current Treatment, and Prognosis:  Patient is aware of his condition and current treatment plans.   Emotional Assessment Appearance:  Appears stated age Attitude/Demeanor/Rapport:   (funny., welcoming ) Affect (typically observed):  Accepting, Calm,  Pleasant Orientation:  Oriented to Self, Oriented to Place, Oriented to  Time, Oriented to Situation Alcohol / Substance use:  Tobacco Use, Alcohol Use (former smoker; ocassional drinker) Psych involvement (Current and /or in the community):  No (Comment)  Discharge Needs  Concerns to be addressed:  No discharge needs identified Readmission within the last 30 days:  No Current discharge risk:  None Barriers to Discharge:  No Barriers Identified   [facility] intern  [phone]

## 2016-02-01 NOTE — Progress Notes (Signed)
Pt stated he has been wearing o2 and its working well. Refused Cpap.

## 2016-02-01 NOTE — Consult Note (Signed)
   [facility] CM Inpatient Consult   02/01/2016  MACKENZIE LIA Aug 23, [id number] Patient screened for potential [facility] Management services. Patient is eligible for [facility] Care Management services under patient's [facility]  Plan. Patient with HX of HF, COPD, Diabetes.  Met with patient and family member at bedside regarding benefits when returning home.  They both were appreciative of the information.   Patient current discharge is to a skilled nursing facility for short term rehab. . Brochure with contact information given.  No community care management needs at this time.  For questions or changes in plan please contact:   Natividad Brood, RN BSN [facility] Liaison  [phone] business mobile phone Toll free office [phone]

## 2016-02-02 DIAGNOSIS — L03116 Cellulitis of left lower limb: Secondary | ICD-10-CM

## 2016-02-02 LAB — BASIC METABOLIC PANEL
Anion gap: 11 (ref 5–15)
BUN: 25 mg/dL — AB (ref 6–20)
CO2: 34 mmol/L — ABNORMAL HIGH (ref 22–32)
CREATININE: 1.85 mg/dL — AB (ref 0.61–1.24)
Calcium: 9 mg/dL (ref 8.9–10.3)
Chloride: 94 mmol/L — ABNORMAL LOW (ref 101–111)
GFR calc Af Amer: 43 mL/min — ABNORMAL LOW (ref >60)
GFR, EST NON AFRICAN AMERICAN: 37 mL/min — AB (ref >60)
Glucose, Bld: 151 mg/dL — ABNORMAL HIGH (ref 65–99)
Potassium: 3.6 mmol/L (ref 3.5–5.1)
SODIUM: 139 mmol/L (ref 135–145)

## 2016-02-02 LAB — PROTIME-INR
INR: 1.72 — AB (ref 0.00–1.49)
Prothrombin Time: 20.1 seconds — ABNORMAL HIGH (ref 11.6–15.2)

## 2016-02-02 LAB — CBC
HCT: 43.4 % (ref 39.0–52.0)
Hemoglobin: 14.3 g/dL (ref 13.0–17.0)
MCH: 32.8 pg (ref 26.0–34.0)
MCHC: 32.9 g/dL (ref 30.0–36.0)
MCV: 99.5 fL (ref 78.0–100.0)
PLATELETS: 231 10*3/uL (ref 150–400)
RBC: 4.36 MIL/uL (ref 4.22–5.81)
RDW: 14.7 % (ref 11.5–15.5)
WBC: 12.9 10*3/uL — AB (ref 4.0–10.5)

## 2016-02-02 LAB — GLUCOSE, CAPILLARY
GLUCOSE-CAPILLARY: 227 mg/dL — AB (ref 65–99)
Glucose-Capillary: 150 mg/dL — ABNORMAL HIGH (ref 65–99)
Glucose-Capillary: 290 mg/dL — ABNORMAL HIGH (ref 65–99)
Glucose-Capillary: 183 mg/dL — ABNORMAL HIGH (ref 65–99)

## 2016-02-02 MED ORDER — INSULIN NPH (HUMAN) (ISOPHANE) 100 UNIT/ML ~~LOC~~ SUSP
28.0000 [IU] | Freq: Every day | SUBCUTANEOUS | Status: DC
  Administered 2016-02-02 – 2016-02-05 (×4): 28 [IU] via SUBCUTANEOUS
  Filled 2016-02-02: qty 10

## 2016-02-02 MED ORDER — WARFARIN SODIUM 5 MG PO TABS
10.0000 mg | ORAL_TABLET | Freq: Once | ORAL | Status: AC
  Administered 2016-02-02: 10 mg via ORAL
  Filled 2016-02-02: qty 2

## 2016-02-02 MED ORDER — BISACODYL 5 MG PO TBEC
10.0000 mg | DELAYED_RELEASE_TABLET | Freq: Once | ORAL | Status: AC
  Administered 2016-02-02: 10 mg via ORAL
  Filled 2016-02-02: qty 2

## 2016-02-02 MED ORDER — SENNOSIDES-DOCUSATE SODIUM 8.6-50 MG PO TABS
2.0000 | ORAL_TABLET | Freq: Two times a day (BID) | ORAL | Status: DC
  Administered 2016-02-02 – 2016-02-05 (×7): 2 via ORAL
  Filled 2016-02-02 (×7): qty 2

## 2016-02-02 MED ORDER — POTASSIUM CHLORIDE CRYS ER 20 MEQ PO TBCR
40.0000 meq | EXTENDED_RELEASE_TABLET | Freq: Once | ORAL | Status: AC
  Administered 2016-02-02: 40 meq via ORAL
  Filled 2016-02-02: qty 2

## 2016-02-02 MED ORDER — POLYETHYLENE GLYCOL 3350 17 G PO PACK
34.0000 g | PACK | Freq: Once | ORAL | Status: AC
  Administered 2016-02-02: 34 g via ORAL
  Filled 2016-02-02: qty 2

## 2016-02-02 NOTE — Progress Notes (Signed)
ANTICOAGULATION CONSULT NOTE - Follow Up Consult  Pharmacy Consult for Coumadin Indication: atrial fibrillation  No Known Allergies  Patient Measurements: Height: 5\' 11"  (180.3 cm) Weight: (!) 316 lb 4.8 oz (143.473 kg) IBW/kg (Calculated) : 75.3   Vital Signs: Temp: 98.4 F (36.9 C) (04/28 0544) BP: 133/62 mmHg (04/28 0544) Pulse Rate: 69 (04/28 0544)  Labs:  Recent Labs  01/31/16 0404 02/01/16 0625 02/02/16 0619  HGB 14.5 14.3 14.3  HCT 43.8 42.6 43.4  PLT 182 215 231  LABPROT 16.5* 19.4* 20.1*  INR 1.32 1.64* 1.72*  CREATININE 1.91* 1.82* 1.85*    Estimated Creatinine Clearance: 60.1 mL/min (by C-G formula based on Cr of 1.85).   Medications:  Scheduled:  . acidophilus  2 capsule Oral Daily  . antiseptic oral rinse  7 mL Mouth Rinse BID  . carvedilol  25 mg Oral BID WC  . cephALEXin  500 mg Oral Q8H  . furosemide  80 mg Intravenous Q8H  . guaiFENesin  600 mg Oral BID  . insulin aspart  0-20 Units Subcutaneous TID WC  . insulin aspart  0-5 Units Subcutaneous QHS  . insulin NPH Human  24 Units Subcutaneous QHS  . isosorbide-hydrALAZINE  1 tablet Oral TID  . rosuvastatin  10 mg Oral q1800  . senna-docusate  2 tablet Oral BID  . sodium chloride flush  3 mL Intravenous Q12H  . spironolactone  25 mg Oral BID  . Warfarin - Pharmacist Dosing Inpatient   Does not apply q1800    Assessment: 65yo male with AFib. Today INR =  1.72  (INR trend last few days 1.32>1.64>1.72 after 7.5mg -7.5-7.5-10-7.5mg  since started on 4/23. INR trending toward goal. Hg and pltc wnl, no bleeding noted.  Patient is morbidly obese.   Goal of Therapy:  INR 2-3 Monitor platelets by anticoagulation protocol: Yes   Plan:  Coumadin 10 mg po x1 today Continue daily INR Monitor for bleeding symptoms.   Thank you for allowing pharmacy to be part of this patients care team. Nicole Cella, RPh Clinical Pharmacist Pager: [phone] 02/02/2016 12:20 PM

## 2016-02-02 NOTE — Progress Notes (Signed)
PROGRESS NOTE    Kurt Cross  [id number] DOB: Aug 25, 1951 DOA: 01/26/2016 PCP: [name], MD   Brief Narrative:  Kurt Cross is a 65 y.o. male with medical history significant of COPD, combined systolic and diastolic heart failure, chronic atrial fibrillation/flutter in the past requiring ventricular pacemaker secondary to severe bradycardia not on anticoagulation possibly due to noncompliance. sleep apnea, morbid obesity, diabetes, hypertension and abdominal aortic aneurysm.  Found to be volume overloaded and started on IV lasix.     Assessment & Plan:   Active Problems:   Diabetes mellitus, insulin dependent (IDDM), uncontrolled (HCC)   Obstructive sleep apnea   Hypertension   Atrial fibrillation/flutter   Hypoxia   Acute on chronic combined systolic and diastolic CHF, NYHA class 2 (HCC)   Acute on chronic renal failure (HCC)   Acute on chronic respiratory failure (HCC)   Cellulitis   Acute on chronic respiratory failure with hypoxia (HCC)   Elevated troponin   Bradycardia   Typical atrial flutter (HCC)  Acute on chronic respiratory failure with hypoxia multifactoral due to  acute on chronic combined systolic and diastolic CHF, baseline morbid obesity, obstructive sleep apnea. - Improving with IV diuresis - Patient  has been refusing BiPAP, and actually did not need BiPAP over last 48 hours, has been discontinued .. - Continue with  oxygen as needed  Acute on chronic combined systolic and diastolic CHF - 2-D echo EF 30-35% - Continue with IV diuresis, managed by cardiology, 11,839 ml since admission, patient diuresed 3L over last 24 hours, renal function remained stable,  would benefit from another 24 hours on IV diuresis per cardiology recommendation. - no ACEI or ARB given her renal failure - Started on Imdur/hydralazine for after load reduction - continue  with Coreg and Aldactone  DM -SSI, CBGs poorly controlled, will increase NPH 24-28.  Atrial flutter - Per  cardiology:  consider atrial flutter ablation down the road if LA size is not enlarged however given noncompliance, he will have to demonstrate that he will be compliant with anticoagulation therapy before we consider this. - Chads2vasc  score of 4, on  Warfarin, dosed by pharmacy  Elevated troponin Likely due to demand ischemia   Morbidy obesity/ OSA Encourage life style changes, noncompliant, refusing CPAP.  Chronic bradycardia - has pacemaker  Acute on chronic renal failure stage 3 - We'll function stable on IV Lasix, actually improving , continue to monitor closely   Cellulitis - Left lower leg cellulitis, significant swelling, but venous Doppler negative for DVT - Initially on Rocephin, will transition to by mouth Keflex - patient with significant leukocytosis, but he remains afebrile, nontoxic appearing  DVT prophylaxis:  SQ Heparin  Code Status: Full Code   Family Communication: None at bedside  Disposition Plan: We'll need SNF placement when stable, hopefully in 1day.    Consultants:   cards  Procedures:     Antimicrobials:      Subjective: Breathing improved, denies any chest pain.  Objective: Filed Vitals:   02/01/16 2015 02/01/16 2342 02/02/16 0544 02/02/16 0545  BP: 128/63  133/62   Pulse: 73 71 69   Temp: 98.4 F (36.9 C)  98.4 F (36.9 C)   TempSrc: Oral     Resp: 20  16   Height:      Weight:    143.473 kg (316 lb 4.8 oz)  SpO2: 99% 96% 97%     Intake/Output Summary (Last 24 hours) at 02/02/16 1301 Last data filed at  02/02/16 [id number]  Gross per 24 hour  Intake    355 ml  Output   3601 ml  Net  -3246 ml   Filed Weights   01/31/16 1835 02/01/16 0153 02/02/16 0545  Weight: 145.5 kg (320 lb 12.3 oz) 142.566 kg (314 lb 4.8 oz) 143.473 kg (316 lb 4.8 oz)    Examination:  General exam: Laying comfortable in bed Respiratory system: diminished, no wheezing Cardiovascular system: irr and occasionally fast Gastrointestinal system:   Normal bowel sounds heard, obese Central nervous system: Alert and oriented. No focal neurological deficits. Extremities: Symmetric 5 x 5 power. Skin: chronic changes to B/L LE, lower extremity edema improving, L>R. Psychiatry: Judgement and insight appear normal. Mood & affect appropriate.     Data Reviewed: I have personally reviewed following labs and imaging studies  CBC:  Recent Labs Lab 01/26/16 2106  01/29/16 0316 01/30/16 0356 01/31/16 0404 02/01/16 0625 02/02/16 0619  WBC 12.3*  < > 13.6* 12.6* 14.7* 16.0* 12.9*  NEUTROABS 10.2*  --   --   --   --   --   --   HGB 16.9  < > 13.6 14.3 14.5 14.3 14.3  HCT 49.8  < > 41.6 44.0 43.8 42.6 43.4  MCV 99.4  < > 100.7* 101.4* 100.9* 100.7* 99.5  PLT 147*  < > 132* 157 182 215 231  < > = values in this interval not displayed. Basic Metabolic Panel:  Recent Labs Lab 01/29/16 0316 01/30/16 0356 01/31/16 0404 02/01/16 0625 02/02/16 0619  NA 136 135 138 137 139  K 3.4* 3.6 3.8 3.6 3.6  CL 94* 92* 95* 93* 94*  CO2 30 33* 33* 33* 34*  GLUCOSE 166* 217* 141* 176* 151*  BUN 29* 26* 28* 28* 25*  CREATININE 2.05* 1.92* 1.91* 1.82* 1.85*  CALCIUM 8.0* 8.5* 8.8* 8.7* 9.0   GFR: Estimated Creatinine Clearance: 60.1 mL/min (by C-G formula based on Cr of 1.85). Liver Function Tests:  Recent Labs Lab 01/26/16 2106  AST 26  ALT 16*  ALKPHOS 28*  BILITOT 1.2  PROT 7.2  ALBUMIN 2.7*   No results for input(s): LIPASE, AMYLASE in the last 168 hours. No results for input(s): AMMONIA in the last 168 hours. Coagulation Profile:  Recent Labs Lab 01/29/16 0316 01/30/16 0356 01/31/16 0404 02/01/16 0625 02/02/16 0619  INR 1.28 1.29 1.32 1.64* 1.72*   Cardiac Enzymes:  Recent Labs Lab 01/26/16 2134 01/27/16 0241 01/27/16 0714 01/27/16 1159  TROPONINI 0.10* 0.15* 0.14* 0.11*   BNP (last 3 results) No results for input(s): PROBNP in the last 8760 hours. HbA1C: No results for input(s): HGBA1C in the last 72  hours. CBG:  Recent Labs Lab 02/01/16 1136 02/01/16 1656 02/01/16 2131 02/02/16 0801 02/02/16 1158  GLUCAP 161* 239* 230* 150* 290*   Lipid Profile: No results for input(s): CHOL, HDL, LDLCALC, TRIG, CHOLHDL, LDLDIRECT in the last 72 hours. Thyroid Function Tests: No results for input(s): TSH, T4TOTAL, FREET4, T3FREE, THYROIDAB in the last 72 hours. Anemia Panel: No results for input(s): VITAMINB12, FOLATE, FERRITIN, TIBC, IRON, RETICCTPCT in the last 72 hours. Urine analysis:    Component Value Date/Time   COLORURINE YELLOW 01/27/2016 1205   APPEARANCEUR CLOUDY* 01/27/2016 1205   LABSPEC 1.016 01/27/2016 1205   PHURINE 5.5 01/27/2016 1205   GLUCOSEU NEGATIVE 01/27/2016 1205   HGBUR LARGE* 01/27/2016 1205   BILIRUBINUR NEGATIVE 01/27/2016 1205   [place] 01/27/2016 1205   PROTEINUR >300* 01/27/2016 1205   UROBILINOGEN 0.2 11/13/2013 0630  NITRITE NEGATIVE 01/27/2016 1205   LEUKOCYTESUR SMALL* 01/27/2016 1205    Recent Results (from the past 240 hour(s))  MRSA PCR Screening     Status: None   Collection Time: 01/27/16  1:27 AM  Result Value Ref Range Status   MRSA by PCR NEGATIVE NEGATIVE Final    Comment:        The GeneXpert MRSA Assay (FDA approved for NASAL specimens only), is one component of a comprehensive MRSA colonization surveillance program. It is not intended to diagnose MRSA infection nor to guide or monitor treatment for MRSA infections.       Anti-infectives    Start     Dose/Rate Route Frequency Ordered Stop   02/01/16 1400  cephALEXin (KEFLEX) capsule 500 mg     500 mg Oral Every 8 hours 02/01/16 1100     01/30/16 1000  azithromycin (ZITHROMAX) tablet 500 mg  Status:  Discontinued     500 mg Oral Daily 01/29/16 1318 01/31/16 1024   01/28/16 0000  vancomycin (VANCOCIN) 1,750 mg in sodium chloride 0.9 % 500 mL IVPB  Status:  Discontinued     1,750 mg 250 mL/hr over 120 Minutes Intravenous Every 24 hours 01/27/16 0157 01/27/16 1128    01/27/16 0200  cefTRIAXone (ROCEPHIN) 1 g in dextrose 5 % 50 mL IVPB  Status:  Discontinued     1 g 100 mL/hr over 30 Minutes Intravenous Daily 01/27/16 0143 01/31/16 1024   01/27/16 0200  azithromycin (ZITHROMAX) 500 mg in dextrose 5 % 250 mL IVPB  Status:  Discontinued     500 mg 250 mL/hr over 60 Minutes Intravenous Daily 01/27/16 0143 01/29/16 1318   01/27/16 0200  vancomycin (VANCOCIN) 2,500 mg in sodium chloride 0.9 % 500 mL IVPB     2,500 mg 250 mL/hr over 120 Minutes Intravenous  Once 01/27/16 0157 01/27/16 0620       Radiology Studies: No results found.      Scheduled Meds: . acidophilus  2 capsule Oral Daily  . antiseptic oral rinse  7 mL Mouth Rinse BID  . carvedilol  25 mg Oral BID WC  . cephALEXin  500 mg Oral Q8H  . furosemide  80 mg Intravenous Q8H  . guaiFENesin  600 mg Oral BID  . insulin aspart  0-20 Units Subcutaneous TID WC  . insulin aspart  0-5 Units Subcutaneous QHS  . insulin NPH Human  24 Units Subcutaneous QHS  . isosorbide-hydrALAZINE  1 tablet Oral TID  . potassium chloride  40 mEq Oral Once  . rosuvastatin  10 mg Oral q1800  . senna-docusate  2 tablet Oral BID  . sodium chloride flush  3 mL Intravenous Q12H  . spironolactone  25 mg Oral BID  . warfarin  10 mg Oral ONCE-1800  . Warfarin - Pharmacist Dosing Inpatient   Does not apply q1800   Continuous Infusions:     LOS: 7 days    Time spent: 25 min    Lesa Vandall, MD [facility] Pager [phone]  If 7PM-7AM, please contact night-coverage [email or web address] Password [email or web address] 02/02/2016, 1:01 PM

## 2016-02-02 NOTE — Progress Notes (Signed)
UR COMPLETED  

## 2016-02-02 NOTE — Progress Notes (Signed)
CSW spoke with pt and pt wife concerning rehab options- after speaking with [facility] liaison pt has decided to go to their facility.  Per MD anticipate DC tomorrow- facility can accept pt over the weekend if medically stable  CSW will continue to follow  Domenica Reamer, [place] Social Worker [phone]

## 2016-02-02 NOTE — Progress Notes (Addendum)
Physical Therapy Treatment Patient Details Name: Kurt Cross MRN: [id number] DOB: [birth year] Today's Date: 02/02/2016    History of Present Illness Kurt Cross is a 65 y.o. male with medical history significant of COPD, combined systolic and diastolic heart failure, chronic atrial fibrillation/flutter in the past requiring ventricular pacemaker secondary to severe bradycardia not on anticoagulation possibly due to noncompliance. sleep apnea, morbid obesity, diabetes, hypertension and abdominal aortic aneurysm. Found to be volume overloaded and started on IV lasix.     PT Comments    Patient continues to require min/mod A +2 for transfers and ambulation and with decreased activity tolerance. Continue to recommend SNF for further skilled PT services to maximize independence and safety with mobility.   Follow Up Recommendations  SNF;Supervision/Assistance - 24 hour     Equipment Recommendations  Other (comment) (TBA)    Recommendations for Other Services       Precautions / Restrictions Precautions Precautions: Fall Restrictions Weight Bearing Restrictions: No    Mobility  Bed Mobility               General bed mobility comments: on BSC upon arrival  Transfers Overall transfer level: Needs assistance Equipment used: Rolling walker (2 wheeled) Transfers: Sit to/from Stand Sit to Stand: +2 physical assistance;Mod assist;Min assist         General transfer comment: mod A +2 first trial form BSC and min A +2 X2 trials from [facility]; cues for hand placement and technique; pt with tendency to "plop" down into chair despite cues; therapist and tech blocking feet   Ambulation/Gait Ambulation/Gait assistance: Min assist;+2 safety/equipment Ambulation Distance (Feet): 50 Feet (25X2) Assistive device: Rolling walker (2 wheeled) Gait Pattern/deviations: Step-to pattern;Decreased step length - right;Decreased step length - left;Decreased dorsiflexion - right;Decreased  dorsiflexion - left;Antalgic;Trunk flexed;Wide base of support   Gait velocity interpretation: Below normal speed for age/gender General Gait Details: pt maintained flexed trunk and bilat LE and unable to plant R heel on floor with tendency to ambulate on forefoot decreasing ability for safe foot clearance; tactile and vc for posture, safe use of DME, and bilat LE heel strike with min A to improve posture with fatigue and guidance of RW; one seated rest break needed   Stairs            Wheelchair Mobility    Modified Rankin (Stroke Patients Only)       Balance     Sitting balance-Leahy Scale: Fair       Standing balance-Leahy Scale: Poor                      Cognition Arousal/Alertness: Awake/alert Behavior During Therapy: WFL for tasks assessed/performed Overall Cognitive Status: Within Functional Limits for tasks assessed                      Exercises General Exercises - Lower Extremity Ankle Circles/Pumps: AROM;Both;Seated;10 reps Quad Sets: AROM;Right;10 reps;Seated;AAROM Other Exercises Other Exercises: R gastroc stretch 30 seconds X 6; seated in recliner with use of blanket    General Comments        Pertinent Vitals/Pain Pain Assessment: Faces Faces Pain Scale: Hurts a little bit Pain Location: bilat LE Pain Descriptors / Indicators: Sore Pain Intervention(s): Monitored during session;Limited activity within patient's tolerance    Home Living                      Prior Function  PT Goals (current goals can now be found in the care plan section) Acute Rehab PT Goals Patient Stated Goal: go home Progress towards PT goals: Progressing toward goals    Frequency  Min 3X/week    PT Plan Current plan remains appropriate    Co-evaluation             End of Session Equipment Utilized During Treatment: Gait belt Activity Tolerance: Patient tolerated treatment well Patient left: in chair;with call  bell/phone within reach     Time: 1140-1206 PT Time Calculation (min) (ACUTE ONLY): 26 min  Charges:  $Gait Training: 8-22 mins $Therapeutic Exercise: 8-22 mins                    G Codes:      [place] April, PTA Pager: [phone]   02/02/2016, 12:23 PM

## 2016-02-02 NOTE — Progress Notes (Signed)
Inpatient Diabetes Program Recommendations  AACE/ADA: New Consensus Statement on Inpatient Glycemic Control (2015)  Target Ranges:  Prepandial:   less than 140 mg/dL      Peak postprandial:   less than 180 mg/dL (1-2 hours)      Critically ill patients:  140 - 180 mg/dL   Review of Glycemic Control  Results for NAZAR, MUSA (MRN [id number]) as of 02/02/2016 13:04  Ref. Range 02/01/2016 11:36 02/01/2016 16:56 02/01/2016 21:31 02/02/2016 08:01 02/02/2016 11:58  Glucose-Capillary Latest Ref Range: 65-99 mg/dL 161 (H) 239 (H) 230 (H) 150 (H) 290 (H)  Home DM Meds: NPH insulin- 20 units bid  Humalog 20 units with breakfast/ 20 units with lunch  Current Insulin Orders: NPH insulin- 204units QHS  Novolog Resistant Correction Scale/ SSI (0-20 units) TID AC + HS   Recommend adding Novolog 5 units TID as meal coverage (hold if patient eats less than 50%)- continue Novolog correction as ordered.   Gentry Fitz, RN, BA, MHA, CDE Diabetes Coordinator Inpatient Diabetes Program  [phone] (Team Pager) [phone] ([place]) 02/02/2016 1:06 PM

## 2016-02-02 NOTE — Plan of Care (Signed)
Problem: Education: Goal: Ability to demonstrate managment of disease process will improve Outcome: Progressing Living better with heart failure booklet given to patient. Goal: Ability to verbalize understanding of medication therapies will improve Outcome: Completed/Met Date Met:  02/02/16 Already spoke with patient about medication.

## 2016-02-02 NOTE — Care Management Important Message (Signed)
Important Message  Patient Details  Name: Kurt Cross MRN: [id number] Date of Birth: 1950-11-28   Medicare Important Message Given:  Yes    [name], RN 02/02/2016, 8:55 AM

## 2016-02-02 NOTE — Progress Notes (Signed)
SUBJECTIVE: No chest pain. Still with some dyspnea.   BP 133/62 mmHg  Pulse 69  Temp(Src) 98.4 F (36.9 C) (Oral)  Resp 16  Ht 5\' 11"  (1.803 m)  Wt 316 lb 4.8 oz (143.473 kg)  BMI 44.13 kg/m2  SpO2 97%  Intake/Output Summary (Last 24 hours) at 02/02/16 1056 Last data filed at 02/02/16 [id number]  Gross per 24 hour  Intake    355 ml  Output   3601 ml  Net  -3246 ml    PHYSICAL EXAM General: Well developed, well nourished, in no acute distress. Alert and oriented x 3.  Psych:  Good affect, responds appropriately Neck: No JVD. No masses noted.  Lungs: Clear bilaterally with no wheezes or rhonci noted.  Heart: Irreg with no murmurs noted. Abdomen: Bowel sounds are present. Soft, non-tender.  Extremities: 2+ LLE edema, 1+ RLE edema.    LABS: Basic Metabolic Panel:  Recent Labs  02/01/16 0625 02/02/16 0619  NA 137 139  K 3.6 3.6  CL 93* 94*  CO2 33* 34*  GLUCOSE 176* 151*  BUN 28* 25*  CREATININE 1.82* 1.85*  CALCIUM 8.7* 9.0   CBC:  Recent Labs  02/01/16 0625 02/02/16 0619  WBC 16.0* 12.9*  HGB 14.3 14.3  HCT 42.6 43.4  MCV 100.7* 99.5  PLT 215 231   Current Meds: . acidophilus  2 capsule Oral Daily  . antiseptic oral rinse  7 mL Mouth Rinse BID  . bisacodyl  10 mg Oral Once  . carvedilol  25 mg Oral BID WC  . cephALEXin  500 mg Oral Q8H  . furosemide  80 mg Intravenous Q8H  . guaiFENesin  600 mg Oral BID  . insulin aspart  0-20 Units Subcutaneous TID WC  . insulin aspart  0-5 Units Subcutaneous QHS  . insulin NPH Human  24 Units Subcutaneous QHS  . isosorbide-hydrALAZINE  1 tablet Oral TID  . polyethylene glycol  34 g Oral Once  . rosuvastatin  10 mg Oral q1800  . senna-docusate  2 tablet Oral BID  . sodium chloride flush  3 mL Intravenous Q12H  . spironolactone  25 mg Oral BID  . Warfarin - Pharmacist Dosing Inpatient   Does not apply q1800     ASSESSMENT AND PLAN: 65 y.o. male with morbid obesity, chronic atrial flutter, chronic systolic  dysfunction, medical non-compliance and OSA who was admitted with respiratory failure secondary to acute on chronic CHF.   1. Acute on chronic resp failure due to acute on chronic diastolic CHF, morbid obesity: Multifactoral due to acute on chronic combined systolic and diastolic CHF exacerbated by renal disease and morbid obesity. He is negative 11.8 liters during the hospitalization and 3.2 liters last 24 hours.  -His volume status is much improved but he still has LE edema/moderate volume overload. I think he would benefit from another day of IV Lasix. Renal function is stable this am. Not able to add ace inhibitor at this time due to renal failure. He has been started on imdur/ hydralazine for afterload reduction. Echo with LVEF=30-35%. This is unchanged from 2015. His long term follow up will be with Dr. Meda Coffee in our office.   2. Typical atrial flutter: Chads2vasc score is 4. He has not been compliant with follow-up or anticoagulation. Continue warfarin with close follow-up in the [place] anticoagulation clinic. Could consider atrial flutter ablation down the road if LA size is not enlarged however given noncompliance, he will have to demonstrate  that he will be compliant with anticoagulation therapy before we consider this.  3. Elevated troponin: Minimal elevation with flat trend in setting of acute CHF and renal failure and without chest pain. Likely due to demand ischemia.   4. Morbidy obesity/ Obesity-hypoventilation syndrome: His size significantly impacts his health. Weight loss is advised  5. Chronic bradycardia: S/p CRT-P device. Will need to enroll in [place] clinic at discharge. Would benefit from remote monitoring if he will comply  6. Acute on chronic kidney disease, stage 3: Stable with diuresis     Kurt Cross,Kurt Cross  4/28/201710:56 AM

## 2016-02-03 LAB — BASIC METABOLIC PANEL
ANION GAP: 13 (ref 5–15)
BUN: 26 mg/dL — ABNORMAL HIGH (ref 6–20)
CALCIUM: 9.2 mg/dL (ref 8.9–10.3)
CO2: 32 mmol/L (ref 22–32)
CREATININE: 1.97 mg/dL — AB (ref 0.61–1.24)
Chloride: 93 mmol/L — ABNORMAL LOW (ref 101–111)
GFR, EST AFRICAN AMERICAN: 40 mL/min — AB (ref >60)
GFR, EST NON AFRICAN AMERICAN: 35 mL/min — AB (ref >60)
Glucose, Bld: 126 mg/dL — ABNORMAL HIGH (ref 65–99)
Potassium: 3.8 mmol/L (ref 3.5–5.1)
SODIUM: 138 mmol/L (ref 135–145)

## 2016-02-03 LAB — GLUCOSE, CAPILLARY
GLUCOSE-CAPILLARY: 146 mg/dL — AB (ref 65–99)
GLUCOSE-CAPILLARY: 246 mg/dL — AB (ref 65–99)
GLUCOSE-CAPILLARY: 204 mg/dL — AB (ref 65–99)
Glucose-Capillary: 186 mg/dL — ABNORMAL HIGH (ref 65–99)

## 2016-02-03 LAB — PROTIME-INR
INR: 1.88 — AB (ref 0.00–1.49)
PROTHROMBIN TIME: 21.5 s — AB (ref 11.6–15.2)

## 2016-02-03 MED ORDER — POLYETHYLENE GLYCOL 3350 17 G PO PACK
14.0000 g | PACK | Freq: Once | ORAL | Status: AC
  Administered 2016-02-03: 14 g via ORAL
  Filled 2016-02-03: qty 1

## 2016-02-03 MED ORDER — SORBITOL 70 % SOLN
960.0000 mL | TOPICAL_OIL | Freq: Once | ORAL | Status: AC
  Administered 2016-02-03: 960 mL via RECTAL
  Filled 2016-02-03: qty 240

## 2016-02-03 MED ORDER — WARFARIN SODIUM 5 MG PO TABS
10.0000 mg | ORAL_TABLET | Freq: Once | ORAL | Status: AC
  Administered 2016-02-03: 10 mg via ORAL
  Filled 2016-02-03 (×2): qty 2

## 2016-02-03 MED ORDER — BISACODYL 5 MG PO TBEC
10.0000 mg | DELAYED_RELEASE_TABLET | Freq: Once | ORAL | Status: AC
  Administered 2016-02-03: 10 mg via ORAL
  Filled 2016-02-03: qty 2

## 2016-02-03 NOTE — Progress Notes (Signed)
PROGRESS NOTE    CODEN Sedalia  [id number] DOB: [birth year] DOA: 01/26/2016 PCP: [name], MD   Brief Narrative:  Kurt Cross is a 65 y.o. male with medical history significant of COPD, combined systolic and diastolic heart failure, chronic atrial fibrillation/flutter in the past requiring ventricular pacemaker secondary to severe bradycardia not on anticoagulation possibly due to noncompliance. sleep apnea, morbid obesity, diabetes, hypertension and abdominal aortic aneurysm.  Found to be volume overloaded and started on IV lasix.     Assessment & Plan:   Active Problems:   Diabetes mellitus, insulin dependent (IDDM), uncontrolled (HCC)   Obstructive sleep apnea   Hypertension   Atrial fibrillation/flutter   Hypoxia   Acute on chronic combined systolic and diastolic CHF, NYHA class 2 (HCC)   Acute on chronic renal failure (HCC)   Acute on chronic respiratory failure (HCC)   Cellulitis   Acute on chronic respiratory failure with hypoxia (HCC)   Elevated troponin   Bradycardia   Typical atrial flutter (HCC)  Acute on chronic respiratory failure with hypoxia multifactoral due to  acute on chronic combined systolic and diastolic CHF, baseline morbid obesity, obstructive sleep apnea. - Improving with IV diuresis - Continue with  oxygen as needed  Acute on chronic combined systolic and diastolic CHF - 2-D echo EF 30-35% - Continue with IV diuresis, managed by cardiology, - 12,159 ml since admission, renal function remained stable, cardiology recommended to continue with IV diuresis. - no ACEI or ARB given her renal failure - Started on Imdur/hydralazine for after load reduction - continue  with Coreg and Aldactone  DM -SSI, CBGs poorly controlled, will increase NPH 24-28.  Atrial flutter - Per cardiology:  consider atrial flutter ablation down the road if LA size is not enlarged however given noncompliance, he will have to demonstrate that he will be compliant with  anticoagulation therapy before we consider this. - Chads2vasc  score of 4, on  Warfarin, dosed by pharmacy  Elevated troponin Likely due to demand ischemia   Morbidy obesity/ OSA Encourage life style changes, noncompliant, refusing CPAP.  Chronic bradycardia - has pacemaker  Acute on chronic renal failure stage 3 - We'll function stable on IV Lasix, actually improving , continue to monitor closely   Cellulitis - Left lower leg cellulitis, significant swelling, but venous Doppler negative for DVT - Initially on Rocephin,transitioned to by mouth Keflex - patient with significant leukocytosis, but he remains afebrile, nontoxic appearing  Constipation - Continue with bowel regimen  DVT prophylaxis:  SQ Heparin  Code Status: Full Code   Family Communication: None at bedside  Disposition Plan: We'll need SNF placement after appropriate IV diuresis, which is managed by cardiology    Consultants:   cards  Procedures:     Antimicrobials:      Subjective: Breathing improved, denies any chest pain.  Objective: Filed Vitals:   02/02/16 1824 02/02/16 2133 02/03/16 0357 02/03/16 0535  BP:  117/68  130/64  Pulse:  70  69  Temp:  98 F (36.7 C)  98.3 F (36.8 C)  TempSrc:    Oral  Resp:  18  18  Height:      Weight: 139.526 kg (307 lb 9.6 oz)  138.438 kg (305 lb 3.2 oz)   SpO2:  90%  94%    Intake/Output Summary (Last 24 hours) at 02/03/16 1241 Last data filed at 02/03/16 0938  Gross per 24 hour  Intake    960 ml  Output   1400  ml  Net   -440 ml   Filed Weights   02/02/16 0545 02/02/16 1824 02/03/16 0357  Weight: 143.473 kg (316 lb 4.8 oz) 139.526 kg (307 lb 9.6 oz) 138.438 kg (305 lb 3.2 oz)    Examination:  General exam: Laying comfortable in bed Respiratory system: diminished, no wheezing Cardiovascular system: irr and occasionally fast Gastrointestinal system:  Normal bowel sounds heard, obese Central nervous system: Alert and oriented. No  focal neurological deficits. Extremities: Symmetric 5 x 5 power. Skin: chronic changes to B/L LE, lower extremity edema improving, L>R. Psychiatry: Judgement and insight appear normal. Mood & affect appropriate.     Data Reviewed: I have personally reviewed following labs and imaging studies  CBC:  Recent Labs Lab 01/29/16 0316 01/30/16 0356 01/31/16 0404 02/01/16 0625 02/02/16 0619  WBC 13.6* 12.6* 14.7* 16.0* 12.9*  HGB 13.6 14.3 14.5 14.3 14.3  HCT 41.6 44.0 43.8 42.6 43.4  MCV 100.7* 101.4* 100.9* 100.7* 99.5  PLT 132* 157 182 215 [id number]   Basic Metabolic Panel:  Recent Labs Lab 01/30/16 0356 01/31/16 0404 02/01/16 0625 02/02/16 0619 02/03/16 0648  NA 135 138 137 139 138  K 3.6 3.8 3.6 3.6 3.8  CL 92* 95* 93* 94* 93*  CO2 33* 33* 33* 34* 32  GLUCOSE 217* 141* 176* 151* 126*  BUN 26* 28* 28* 25* 26*  CREATININE 1.92* 1.91* 1.82* 1.85* 1.97*  CALCIUM 8.5* 8.8* 8.7* 9.0 9.2   GFR: Estimated Creatinine Clearance: 55.3 mL/min (by C-G formula based on Cr of 1.97). Liver Function Tests: No results for input(s): AST, ALT, ALKPHOS, BILITOT, PROT, ALBUMIN in the last 168 hours. No results for input(s): LIPASE, AMYLASE in the last 168 hours. No results for input(s): AMMONIA in the last 168 hours. Coagulation Profile:  Recent Labs Lab 01/30/16 0356 01/31/16 0404 02/01/16 0625 02/02/16 0619 02/03/16 0648  INR 1.29 1.32 1.64* 1.72* 1.88*   Cardiac Enzymes: No results for input(s): CKTOTAL, CKMB, CKMBINDEX, TROPONINI in the last 168 hours. BNP (last 3 results) No results for input(s): PROBNP in the last 8760 hours. HbA1C: No results for input(s): HGBA1C in the last 72 hours. CBG:  Recent Labs Lab 02/02/16 1158 02/02/16 1703 02/02/16 2140 02/03/16 0919 02/03/16 1219  GLUCAP 290* 227* 183* 146* 246*   Lipid Profile: No results for input(s): CHOL, HDL, LDLCALC, TRIG, CHOLHDL, LDLDIRECT in the last 72 hours. Thyroid Function Tests: No results for input(s):  TSH, T4TOTAL, FREET4, T3FREE, THYROIDAB in the last 72 hours. Anemia Panel: No results for input(s): VITAMINB12, FOLATE, FERRITIN, TIBC, IRON, RETICCTPCT in the last 72 hours. Urine analysis:    Component Value Date/Time   COLORURINE YELLOW 01/27/2016 1205   APPEARANCEUR CLOUDY* 01/27/2016 1205   LABSPEC 1.016 01/27/2016 1205   PHURINE 5.5 01/27/2016 1205   GLUCOSEU NEGATIVE 01/27/2016 1205   HGBUR LARGE* 01/27/2016 1205   Iron 01/27/2016 1205   [place] 01/27/2016 1205   PROTEINUR >300* 01/27/2016 1205   UROBILINOGEN 0.2 11/13/2013 0630   NITRITE NEGATIVE 01/27/2016 1205   LEUKOCYTESUR SMALL* 01/27/2016 1205    Recent Results (from the past 240 hour(s))  MRSA PCR Screening     Status: None   Collection Time: 01/27/16  1:27 AM  Result Value Ref Range Status   MRSA by PCR NEGATIVE NEGATIVE Final    Comment:        The GeneXpert MRSA Assay (FDA approved for NASAL specimens only), is one component of a comprehensive MRSA colonization surveillance program. It is not  intended to diagnose MRSA infection nor to guide or monitor treatment for MRSA infections.       Anti-infectives    Start     Dose/Rate Route Frequency Ordered Stop   02/01/16 1400  cephALEXin (KEFLEX) capsule 500 mg     500 mg Oral Every 8 hours 02/01/16 1100     01/30/16 1000  azithromycin (ZITHROMAX) tablet 500 mg  Status:  Discontinued     500 mg Oral Daily 01/29/16 1318 01/31/16 1024   01/28/16 0000  vancomycin (VANCOCIN) 1,750 mg in sodium chloride 0.9 % 500 mL IVPB  Status:  Discontinued     1,750 mg 250 mL/hr over 120 Minutes Intravenous Every 24 hours 01/27/16 0157 01/27/16 1128   01/27/16 0200  cefTRIAXone (ROCEPHIN) 1 g in dextrose 5 % 50 mL IVPB  Status:  Discontinued     1 g 100 mL/hr over 30 Minutes Intravenous Daily 01/27/16 0143 01/31/16 1024   01/27/16 0200  azithromycin (ZITHROMAX) 500 mg in dextrose 5 % 250 mL IVPB  Status:  Discontinued     500 mg 250 mL/hr  over 60 Minutes Intravenous Daily 01/27/16 0143 01/29/16 1318   01/27/16 0200  vancomycin (VANCOCIN) 2,500 mg in sodium chloride 0.9 % 500 mL IVPB     2,500 mg 250 mL/hr over 120 Minutes Intravenous  Once 01/27/16 0157 01/27/16 0620       Radiology Studies: No results found.      Scheduled Meds: . acidophilus  2 capsule Oral Daily  . antiseptic oral rinse  7 mL Mouth Rinse BID  . bisacodyl  10 mg Oral Once  . carvedilol  25 mg Oral BID WC  . cephALEXin  500 mg Oral Q8H  . furosemide  80 mg Intravenous Q8H  . guaiFENesin  600 mg Oral BID  . insulin aspart  0-20 Units Subcutaneous TID WC  . insulin aspart  0-5 Units Subcutaneous QHS  . insulin NPH Human  28 Units Subcutaneous QHS  . isosorbide-hydrALAZINE  1 tablet Oral TID  . polyethylene glycol  14 g Oral Once  . rosuvastatin  10 mg Oral q1800  . senna-docusate  2 tablet Oral BID  . sodium chloride flush  3 mL Intravenous Q12H  . sorbitol, milk of mag, mineral oil, glycerin (SMOG) enema  960 mL Rectal Once  . spironolactone  25 mg Oral BID  . Warfarin - Pharmacist Dosing Inpatient   Does not apply q1800   Continuous Infusions:     LOS: 8 days    Time spent: 25 min    Byrl Latin, MD [facility] Pager [phone]  If 7PM-7AM, please contact night-coverage [email or web address] Password [facility] 02/03/2016, 12:41 PM

## 2016-02-03 NOTE — Progress Notes (Signed)
ANTICOAGULATION CONSULT NOTE - Follow Up Consult  Pharmacy Consult for Coumadin Indication: atrial fibrillation  No Known Allergies  Patient Measurements: Height: 5\' 11"  (180.3 cm) Weight: (!) 305 lb 3.2 oz (138.438 kg) IBW/kg (Calculated) : 75.3   Vital Signs: Temp: 98.3 F (36.8 C) (04/29 1440) Temp Source: Oral (04/29 0535) BP: 110/71 mmHg (04/29 1440) Pulse Rate: 73 (04/29 1440)  Labs:  Recent Labs  02/01/16 0625 02/02/16 0619 02/03/16 0648  HGB 14.3 14.3  --   HCT 42.6 43.4  --   PLT 215 231  --   LABPROT 19.4* 20.1* 21.5*  INR 1.64* 1.72* 1.88*  CREATININE 1.82* 1.85* 1.97*    Estimated Creatinine Clearance: 55.3 mL/min (by C-G formula based on Cr of 1.97).   Medications:  Scheduled:  . acidophilus  2 capsule Oral Daily  . antiseptic oral rinse  7 mL Mouth Rinse BID  . carvedilol  25 mg Oral BID WC  . cephALEXin  500 mg Oral Q8H  . furosemide  80 mg Intravenous Q8H  . guaiFENesin  600 mg Oral BID  . insulin aspart  0-20 Units Subcutaneous TID WC  . insulin aspart  0-5 Units Subcutaneous QHS  . insulin NPH Human  28 Units Subcutaneous QHS  . isosorbide-hydrALAZINE  1 tablet Oral TID  . rosuvastatin  10 mg Oral q1800  . senna-docusate  2 tablet Oral BID  . sodium chloride flush  3 mL Intravenous Q12H  . sorbitol, milk of mag, mineral oil, glycerin (SMOG) enema  960 mL Rectal Once  . spironolactone  25 mg Oral BID  . Warfarin - Pharmacist Dosing Inpatient   Does not apply q1800    Assessment: 65yo male with AFib. Today INR =  1.88 (trend up)  Goal of Therapy:  INR 2-3 Monitor platelets by anticoagulation protocol: Yes   Plan:  Coumadin 10 mg po x1 today Continue daily INR  Hildred Laser, Pharm D 02/03/2016 3:22 PM

## 2016-02-03 NOTE — Progress Notes (Signed)
Subjective:  No chest pain, dyspnea has improved somewhat.  Objective:  Vital Signs in the last 24 hours: BP 130/64 mmHg  Pulse 69  Temp(Src) 98.3 F (36.8 C) (Oral)  Resp 18  Ht 5\' 11"  (1.803 m)  Wt 138.438 kg (305 lb 3.2 oz)  BMI 42.59 kg/m2  SpO2 94%  Physical Exam: Extremely obese black male currently in no acute distress  Lungs:  Clear  Cardiac:  Irregular rhythm, normal S1 and S2, no S3 Abdomen:  Soft, nontender, no masses severely enlarged Extremities:  2+ edema noted with lower extremity venous changes, skin is cracked   Intake/Output from previous day: 04/28 0701 - 04/29 0700 In: 1080 [P.O.:1080] Out: 800 [Urine:800] Weight Filed Weights   02/02/16 0545 02/02/16 1824 02/03/16 0357  Weight: 143.473 kg (316 lb 4.8 oz) 139.526 kg (307 lb 9.6 oz) 138.438 kg (305 lb 3.2 oz)    Lab Results: Basic Metabolic Panel:  Recent Labs  02/02/16 0619 02/03/16 0648  NA 139 138  K 3.6 3.8  CL 94* 93*  CO2 34* 32  GLUCOSE 151* 126*  BUN 25* 26*  CREATININE 1.85* 1.97*    CBC:  Recent Labs  02/01/16 0625 02/02/16 0619  WBC 16.0* 12.9*  HGB 14.3 14.3  HCT 42.6 43.4  MCV 100.7* 99.5  PLT 215 231    BNP    Component Value Date/Time   BNP 409.7* 01/27/2016 0241   Assessment/Plan:  1.  Acute on chronic systolic and diastolic congestive heart failure 2.  Functioning pacemaker 3.  Chronic atrial fibrillation and atrial flutter  4.  Noncompliance 5.  Stage 2 to 3 chronic kidney disease  Recommendations:  He needs to continue diuresis.  Weight is down from 325 to 305.  Continue diuresis and blood pressure control for the present time.  Kerry Hough  MD [facility]  02/03/2016, 12:16 PM

## 2016-02-04 LAB — BASIC METABOLIC PANEL
Anion gap: 11 (ref 5–15)
BUN: 30 mg/dL — ABNORMAL HIGH (ref 6–20)
CHLORIDE: 95 mmol/L — AB (ref 101–111)
CO2: 31 mmol/L (ref 22–32)
CREATININE: 2.08 mg/dL — AB (ref 0.61–1.24)
Calcium: 9.2 mg/dL (ref 8.9–10.3)
GFR calc Af Amer: 38 mL/min — ABNORMAL LOW (ref >60)
GFR calc non Af Amer: 32 mL/min — ABNORMAL LOW (ref >60)
Glucose, Bld: 123 mg/dL — ABNORMAL HIGH (ref 65–99)
Potassium: 3.8 mmol/L (ref 3.5–5.1)
Sodium: 137 mmol/L (ref 135–145)

## 2016-02-04 LAB — PROTIME-INR
INR: 2.2 — ABNORMAL HIGH (ref 0.00–1.49)
Prothrombin Time: 24.3 seconds — ABNORMAL HIGH (ref 11.6–15.2)

## 2016-02-04 LAB — GLUCOSE, CAPILLARY
GLUCOSE-CAPILLARY: 170 mg/dL — AB (ref 65–99)
Glucose-Capillary: 116 mg/dL — ABNORMAL HIGH (ref 65–99)
Glucose-Capillary: 166 mg/dL — ABNORMAL HIGH (ref 65–99)
Glucose-Capillary: 287 mg/dL — ABNORMAL HIGH (ref 65–99)

## 2016-02-04 MED ORDER — FUROSEMIDE 80 MG PO TABS
80.0000 mg | ORAL_TABLET | Freq: Two times a day (BID) | ORAL | Status: DC
  Administered 2016-02-04 – 2016-02-06 (×4): 80 mg via ORAL
  Filled 2016-02-04 (×4): qty 1

## 2016-02-04 MED ORDER — WARFARIN SODIUM 4 MG PO TABS
8.0000 mg | ORAL_TABLET | Freq: Every day | ORAL | Status: DC
  Administered 2016-02-04 – 2016-02-05 (×2): 8 mg via ORAL
  Filled 2016-02-04 (×3): qty 2

## 2016-02-04 NOTE — Progress Notes (Signed)
Subjective:  He had a large BM post an enema.  Breathing has improved.  No chest pain.  Renal function has worsened somewhat and weight is down 2 more pounds.  Objective:  Vital Signs in the last 24 hours: BP 108/65 mmHg  Pulse 70  Temp(Src) 99 F (37.2 C) (Oral)  Resp 18  Ht 5\' 11"  (1.803 m)  Wt 137.667 kg (303 lb 8 oz)  BMI 42.35 kg/m2  SpO2 92%  Physical Exam: Extremely obese black male currently in no acute distress  Lungs:  Clear  Cardiac:  Irregular rhythm, normal S1 and S2, no S3 Abdomen:  Soft, nontender, no masses severely enlarged Extremities:  2+ edema noted with lower extremity venous changes, skin is cracked   Intake/Output from previous day: 04/29 0701 - 04/30 0700 In: 480 [P.O.:480] Out: 2550 [Urine:2550] Weight Filed Weights   02/02/16 1824 02/03/16 0357 02/04/16 0514  Weight: 139.526 kg (307 lb 9.6 oz) 138.438 kg (305 lb 3.2 oz) 137.667 kg (303 lb 8 oz)    Lab Results: Basic Metabolic Panel:  Recent Labs  02/03/16 0648 02/04/16 0722  NA 138 137  K 3.8 3.8  CL 93* 95*  CO2 32 31  GLUCOSE 126* 123*  BUN 26* 30*  CREATININE 1.97* 2.08*    CBC:  Recent Labs  02/02/16 0619  WBC 12.9*  HGB 14.3  HCT 43.4  MCV 99.5  PLT 231    BNP    Component Value Date/Time   BNP 409.7* 01/27/2016 0241   Assessment/Plan:  1.  Acute on chronic systolic and diastolic congestive heart failure clinically improved 2.  Functioning pacemaker 3.  Chronic atrial fibrillation and atrial flutter  4.  Noncompliance 5.  Stage stage III chronic kidney disease with worsening and probably is at dry weight.    Recommendations:  With creatinine rising would stop IV diuresis and changed to oral.  May need to hold the dose if creatinine continues to climb.  Continue to monitor.   Kerry Hough  MD [facility]  02/04/2016, 10:22 AM

## 2016-02-04 NOTE — Progress Notes (Signed)
ANTICOAGULATION CONSULT NOTE - Follow Up Consult  Pharmacy Consult for Coumadin Indication: atrial fibrillation  No Known Allergies  Patient Measurements: Height: 5\' 11"  (180.3 cm) Weight: (!) 303 lb 8 oz (137.667 kg) IBW/kg (Calculated) : 75.3   Vital Signs: BP: 108/65 mmHg (04/30 0515) Pulse Rate: 70 (04/30 0515)  Labs:  Recent Labs  02/02/16 0619 02/03/16 0648 02/04/16 0722  HGB 14.3  --   --   HCT 43.4  --   --   PLT 231  --   --   LABPROT 20.1* 21.5* 24.3*  INR 1.72* 1.88* 2.20*  CREATININE 1.85* 1.97* 2.08*    Estimated Creatinine Clearance: 52.2 mL/min (by C-G formula based on Cr of 2.08).   Medications:  Scheduled:  . acidophilus  2 capsule Oral Daily  . antiseptic oral rinse  7 mL Mouth Rinse BID  . carvedilol  25 mg Oral BID WC  . cephALEXin  500 mg Oral Q8H  . furosemide  80 mg Oral BID  . guaiFENesin  600 mg Oral BID  . insulin aspart  0-20 Units Subcutaneous TID WC  . insulin aspart  0-5 Units Subcutaneous QHS  . insulin NPH Human  28 Units Subcutaneous QHS  . isosorbide-hydrALAZINE  1 tablet Oral TID  . rosuvastatin  10 mg Oral q1800  . senna-docusate  2 tablet Oral BID  . sodium chloride flush  3 mL Intravenous Q12H  . spironolactone  25 mg Oral BID  . Warfarin - Pharmacist Dosing Inpatient   Does not apply q1800    Assessment: 65yo male with AFib. Today INR =  2.2. Will try a standing dose today to see.   Goal of Therapy:  INR 2-3 Monitor platelets by anticoagulation protocol: Yes   Plan:  Coumadin 8mg  PO qday Continue daily INR  Onnie Boer, PharmD Pager: [phone] 02/04/2016 10:57 AM

## 2016-02-04 NOTE — Progress Notes (Signed)
PROGRESS NOTE    BON REASON  [id number] DOB: 1951/04/28 DOA: 01/26/2016 PCP: [name], MD   Brief Narrative:  Kurt Cross is a 65 y.o. male with medical history significant of COPD, combined systolic and diastolic heart failure, chronic atrial fibrillation/flutter in the past requiring ventricular pacemaker secondary to severe bradycardia not on anticoagulation possibly due to noncompliance. sleep apnea, morbid obesity, diabetes, hypertension and abdominal aortic aneurysm.  Found to be volume overloaded and started on IV lasix.     Assessment & Plan:   Active Problems:   Diabetes mellitus, insulin dependent (IDDM), uncontrolled (HCC)   Obstructive sleep apnea   Hypertension   Atrial fibrillation/flutter   Hypoxia   Acute on chronic combined systolic and diastolic CHF, NYHA class 2 (HCC)   Acute on chronic renal failure (HCC)   Acute on chronic respiratory failure (HCC)   Cellulitis   Acute on chronic respiratory failure with hypoxia (HCC)   Elevated troponin   Bradycardia   Typical atrial flutter (HCC)  Acute on chronic respiratory failure with hypoxia multifactoral due to  acute on chronic combined systolic and diastolic CHF, baseline morbid obesity, obstructive sleep apnea. - Improving with IV diuresis - Continue with  oxygen as needed  Acute on chronic combined systolic and diastolic CHF - 2-D echo EF 30-35% - Continue with IV diuresis, managed by cardiology, - 13,629 ml since admission, renal function remained stable, transitioned to by mouth Lasix by cardiology today given slight bump in creatinine, follow on BMP in a.m. to ensure stable renal function. - no ACEI or ARB given her renal failure - Started on Imdur/hydralazine for after load reduction - continue  with Coreg and Aldactone  DM -SSI, CBGs poorly controlled, will increase NPH 24-28.  Atrial flutter - Per cardiology:  consider atrial flutter ablation down the road if LA size is not enlarged however  given noncompliance, he will have to demonstrate that he will be compliant with anticoagulation therapy before we consider this. - Chads2vasc  score of 4, on  Warfarin, dosed by pharmacy  Elevated troponin Likely due to demand ischemia   Morbidy obesity/ OSA Encourage life style changes, noncompliant, refusing CPAP.  Chronic bradycardia - has pacemaker  Acute on chronic renal failure stage 3 - We'll function stable on IV Lasix, actually improving , continue to monitor closely   Cellulitis - Left lower leg cellulitis, significant swelling, but venous Doppler negative for DVT - Initially on Rocephin,transitioned to by mouth Keflex - patient with significant leukocytosis, but he remains afebrile, nontoxic appearing  Constipation - Continue with bowel regimen  DVT prophylaxis:  SQ Heparin  Code Status: Full Code   Family Communication: None at bedside  Disposition Plan: We'll need SNF placement after appropriate IV diuresis, which is managed by cardiology    Consultants:   cards  Procedures:     Antimicrobials:      Subjective: Breathing improved, denies any chest pain.  Objective: Filed Vitals:   02/03/16 1440 02/03/16 2112 02/04/16 0514 02/04/16 0515  BP: 110/71 136/63  108/65  Pulse: 73 70  70  Temp: 98.3 F (36.8 C) 99 F (37.2 C)    TempSrc:  Oral    Resp: 20 18  18   Height:      Weight:   137.667 kg (303 lb 8 oz)   SpO2: 94% 91%  92%    Intake/Output Summary (Last 24 hours) at 02/04/16 1219 Last data filed at 02/04/16 0519  Gross per 24 hour  Intake    480 ml  Output   1950 ml  Net  -1470 ml   Filed Weights   02/02/16 1824 02/03/16 0357 02/04/16 0514  Weight: 139.526 kg (307 lb 9.6 oz) 138.438 kg (305 lb 3.2 oz) 137.667 kg (303 lb 8 oz)    Examination:  General exam: Laying comfortable in bed Respiratory system: diminished, no wheezing Cardiovascular system: irr and occasionally fast Gastrointestinal system:  Normal bowel sounds  heard, obese Central nervous system: Alert and oriented. No focal neurological deficits. Extremities: Symmetric 5 x 5 power. Skin: chronic changes to B/L , Edema resolved and right lower extremity, +1 edema and left lower extremity Psychiatry: Judgement and insight appear normal. Mood & affect appropriate.     Data Reviewed: I have personally reviewed following labs and imaging studies  CBC:  Recent Labs Lab 01/29/16 0316 01/30/16 0356 01/31/16 0404 02/01/16 0625 02/02/16 0619  WBC 13.6* 12.6* 14.7* 16.0* 12.9*  HGB 13.6 14.3 14.5 14.3 14.3  HCT 41.6 44.0 43.8 42.6 43.4  MCV 100.7* 101.4* 100.9* 100.7* 99.5  PLT 132* 157 182 215 [id number]   Basic Metabolic Panel:  Recent Labs Lab 01/31/16 0404 02/01/16 0625 02/02/16 0619 02/03/16 0648 02/04/16 0722  NA 138 137 139 138 137  K 3.8 3.6 3.6 3.8 3.8  CL 95* 93* 94* 93* 95*  CO2 33* 33* 34* 32 31  GLUCOSE 141* 176* 151* 126* 123*  BUN 28* 28* 25* 26* 30*  CREATININE 1.91* 1.82* 1.85* 1.97* 2.08*  CALCIUM 8.8* 8.7* 9.0 9.2 9.2   GFR: Estimated Creatinine Clearance: 52.2 mL/min (by C-G formula based on Cr of 2.08). Liver Function Tests: No results for input(s): AST, ALT, ALKPHOS, BILITOT, PROT, ALBUMIN in the last 168 hours. No results for input(s): LIPASE, AMYLASE in the last 168 hours. No results for input(s): AMMONIA in the last 168 hours. Coagulation Profile:  Recent Labs Lab 01/31/16 0404 02/01/16 0625 02/02/16 0619 02/03/16 0648 02/04/16 0722  INR 1.32 1.64* 1.72* 1.88* 2.20*   Cardiac Enzymes: No results for input(s): CKTOTAL, CKMB, CKMBINDEX, TROPONINI in the last 168 hours. BNP (last 3 results) No results for input(s): PROBNP in the last 8760 hours. HbA1C: No results for input(s): HGBA1C in the last 72 hours. CBG:  Recent Labs Lab 02/03/16 0919 02/03/16 1219 02/03/16 1802 02/03/16 2115 02/04/16 0744  GLUCAP 146* 246* 204* 186* 116*   Lipid Profile: No results for input(s): CHOL, HDL, LDLCALC,  TRIG, CHOLHDL, LDLDIRECT in the last 72 hours. Thyroid Function Tests: No results for input(s): TSH, T4TOTAL, FREET4, T3FREE, THYROIDAB in the last 72 hours. Anemia Panel: No results for input(s): VITAMINB12, FOLATE, FERRITIN, TIBC, IRON, RETICCTPCT in the last 72 hours. Urine analysis:    Component Value Date/Time   COLORURINE YELLOW 01/27/2016 1205   APPEARANCEUR CLOUDY* 01/27/2016 1205   LABSPEC 1.016 01/27/2016 1205   PHURINE 5.5 01/27/2016 1205   GLUCOSEU NEGATIVE 01/27/2016 1205   HGBUR LARGE* 01/27/2016 1205   [place] 01/27/2016 1205   [place] 01/27/2016 1205   PROTEINUR >300* 01/27/2016 1205   UROBILINOGEN 0.2 11/13/2013 0630   NITRITE NEGATIVE 01/27/2016 1205   LEUKOCYTESUR SMALL* 01/27/2016 1205    Recent Results (from the past 240 hour(s))  MRSA PCR Screening     Status: None   Collection Time: 01/27/16  1:27 AM  Result Value Ref Range Status   MRSA by PCR NEGATIVE NEGATIVE Final    Comment:        The GeneXpert MRSA Assay (FDA approved  for NASAL specimens only), is one component of a comprehensive MRSA colonization surveillance program. It is not intended to diagnose MRSA infection nor to guide or monitor treatment for MRSA infections.       Anti-infectives    Start     Dose/Rate Route Frequency Ordered Stop   02/01/16 1400  cephALEXin (KEFLEX) capsule 500 mg     500 mg Oral Every 8 hours 02/01/16 1100     01/30/16 1000  azithromycin (ZITHROMAX) tablet 500 mg  Status:  Discontinued     500 mg Oral Daily 01/29/16 1318 01/31/16 1024   01/28/16 0000  vancomycin (VANCOCIN) 1,750 mg in sodium chloride 0.9 % 500 mL IVPB  Status:  Discontinued     1,750 mg 250 mL/hr over 120 Minutes Intravenous Every 24 hours 01/27/16 0157 01/27/16 1128   01/27/16 0200  cefTRIAXone (ROCEPHIN) 1 g in dextrose 5 % 50 mL IVPB  Status:  Discontinued     1 g 100 mL/hr over 30 Minutes Intravenous Daily 01/27/16 0143 01/31/16 1024   01/27/16 0200   azithromycin (ZITHROMAX) 500 mg in dextrose 5 % 250 mL IVPB  Status:  Discontinued     500 mg 250 mL/hr over 60 Minutes Intravenous Daily 01/27/16 0143 01/29/16 1318   01/27/16 0200  vancomycin (VANCOCIN) 2,500 mg in sodium chloride 0.9 % 500 mL IVPB     2,500 mg 250 mL/hr over 120 Minutes Intravenous  Once 01/27/16 0157 01/27/16 0620       Radiology Studies: No results found.      Scheduled Meds: . acidophilus  2 capsule Oral Daily  . antiseptic oral rinse  7 mL Mouth Rinse BID  . carvedilol  25 mg Oral BID WC  . cephALEXin  500 mg Oral Q8H  . furosemide  80 mg Oral BID  . guaiFENesin  600 mg Oral BID  . insulin aspart  0-20 Units Subcutaneous TID WC  . insulin aspart  0-5 Units Subcutaneous QHS  . insulin NPH Human  28 Units Subcutaneous QHS  . isosorbide-hydrALAZINE  1 tablet Oral TID  . rosuvastatin  10 mg Oral q1800  . senna-docusate  2 tablet Oral BID  . sodium chloride flush  3 mL Intravenous Q12H  . spironolactone  25 mg Oral BID  . warfarin  8 mg Oral q1800  . Warfarin - Pharmacist Dosing Inpatient   Does not apply q1800   Continuous Infusions:     LOS: 9 days    Time spent: 25 min    ELGERGAWY, DAWOOD, MD [facility] Pager [phone]  If 7PM-7AM, please contact night-coverage [email or web address] Password [email or web address] 02/04/2016, 12:19 PM

## 2016-02-04 NOTE — Progress Notes (Signed)
Placed patient on CPAP for the night.  Patient is tolerating well at this time. 

## 2016-02-05 LAB — BASIC METABOLIC PANEL
ANION GAP: 12 (ref 5–15)
BUN: 30 mg/dL — ABNORMAL HIGH (ref 6–20)
CHLORIDE: 94 mmol/L — AB (ref 101–111)
CO2: 32 mmol/L (ref 22–32)
Calcium: 9 mg/dL (ref 8.9–10.3)
Creatinine, Ser: 2.18 mg/dL — ABNORMAL HIGH (ref 0.61–1.24)
GFR calc non Af Amer: 31 mL/min — ABNORMAL LOW (ref >60)
GFR, EST AFRICAN AMERICAN: 36 mL/min — AB (ref >60)
Glucose, Bld: 129 mg/dL — ABNORMAL HIGH (ref 65–99)
POTASSIUM: 3.8 mmol/L (ref 3.5–5.1)
Sodium: 138 mmol/L (ref 135–145)

## 2016-02-05 LAB — GLUCOSE, CAPILLARY
GLUCOSE-CAPILLARY: 197 mg/dL — AB (ref 65–99)
GLUCOSE-CAPILLARY: 202 mg/dL — AB (ref 65–99)
Glucose-Capillary: 146 mg/dL — ABNORMAL HIGH (ref 65–99)

## 2016-02-05 LAB — PROTIME-INR
INR: 2.57 — ABNORMAL HIGH (ref 0.00–1.49)
Prothrombin Time: 27.3 seconds — ABNORMAL HIGH (ref 11.6–15.2)

## 2016-02-05 MED ORDER — INSULIN ASPART 100 UNIT/ML ~~LOC~~ SOLN: 0.0000 [IU] | Freq: Three times a day (TID) | SUBCUTANEOUS | Status: DC

## 2016-02-05 MED ORDER — WARFARIN SODIUM 4 MG PO TABS: 8.0000 mg | ORAL_TABLET | Freq: Every day | ORAL | Status: DC

## 2016-02-05 MED ORDER — SENNOSIDES-DOCUSATE SODIUM 8.6-50 MG PO TABS: 2.0000 | ORAL_TABLET | Freq: Two times a day (BID) | ORAL | Status: DC

## 2016-02-05 MED ORDER — INSULIN NPH (HUMAN) (ISOPHANE) 100 UNIT/ML ~~LOC~~ SUSP: 28.0000 [IU] | Freq: Every day | SUBCUTANEOUS | Status: DC

## 2016-02-05 MED ORDER — RISAQUAD PO CAPS: 2.0000 | ORAL_CAPSULE | Freq: Every day | ORAL | Status: AC

## 2016-02-05 MED ORDER — ISOSORB DINITRATE-HYDRALAZINE 20-37.5 MG PO TABS: 1.0000 | ORAL_TABLET | Freq: Three times a day (TID) | ORAL | Status: DC

## 2016-02-05 MED ORDER — ALBUTEROL SULFATE (2.5 MG/3ML) 0.083% IN NEBU: 2.5000 mg | INHALATION_SOLUTION | RESPIRATORY_TRACT | Status: DC | PRN

## 2016-02-05 MED ORDER — CEPHALEXIN 500 MG PO CAPS: 500.0000 mg | ORAL_CAPSULE | Freq: Three times a day (TID) | ORAL | Status: DC

## 2016-02-05 MED ORDER — OXYCODONE-ACETAMINOPHEN 5-325 MG PO TABS: 1.0000 | ORAL_TABLET | Freq: Four times a day (QID) | ORAL | Status: DC | PRN

## 2016-02-05 NOTE — Care Management Note (Addendum)
Case Management Note  Patient Details  Name: Kurt Cross MRN: [id number] Date of Birth: [birth year]  Subjective/Objective:            Admitted with hypoxia.       Action/Plan: Plan is to d/c to SNF [name]) today.  Expected Discharge Date:  02/06/2016              Expected Discharge Plan:     In-House Referral:     Discharge planning Services     Post Acute Care Choice:    Choice offered to:     DME Arranged:    DME Agency:     HH Arranged:    [place] Agency:     Status of Service:     Medicare Important Message Given:   Date Medicare IM Given:    Medicare IM give by:    Date Additional Medicare IM Given:    Additional Medicare Important Message give by:     If discussed at [place] of Stay Meetings, dates discussed:    Additional Comments:  [name], [place] [phone] 02/05/2016, 10:03 AM

## 2016-02-05 NOTE — Discharge Summary (Signed)
Kurt Cross, is a 65 y.o. male  DOB [birth year]  MRN [id number].  Admission date:  01/26/2016  Admitting Physician  Toy [name], MD  Discharge Date:  02/05/2016   Primary MD  [name], MD  Recommendations for primary care physician for things to follow:  - Patient was started on warfarin during hospital stay, his monitor INR and adjust warfarin dose closely. - Patient to continue C Pap at bedtime Auto CPAP 6-20 CMH2O via FFM with 2 LPM O2 bleed in - Please check CBC, BMP in 3 days, and monitor closely as on diuresis  Admission Diagnosis  Cough [R05]   Discharge Diagnosis  Cough [R05]    Active Problems:   Diabetes mellitus, insulin dependent (IDDM), uncontrolled (HCC)   Obstructive sleep apnea   Hypertension   Atrial fibrillation/flutter   Hypoxia   Acute on chronic combined systolic and diastolic CHF, NYHA class 2 (HCC)   Acute on chronic renal failure (HCC)   Acute on chronic respiratory failure (HCC)   Cellulitis   Acute on chronic respiratory failure with hypoxia (HCC)   Elevated troponin   Bradycardia   Typical atrial flutter (HCC)      Past Medical History  Diagnosis Date  . Sleep apnea   . Diabetes mellitus, type 2 ([place])   . Hypertension   . Hyperlipidemia   . CHF (congestive heart failure), NYHA class II ([place])   . Hernia of abdominal cavity   . AAA (abdominal aortic aneurysm)/ 4.5 cm ascending per CT angio 08/01/13 08/12/2013  . 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 Constipation, chronic 08/03/2013  . Benign hypertension   . Hypokalemia   . OSA (obstructive sleep apnea)   . Neuropathy ([place])   . Hx of Mobitz type II block     s/p PPM Bi-Ven  . Atrial fibrillation [facility])     Past Surgical History  Procedure Laterality Date  . Appendectomy    . Tonsillectomy and adenoidectomy    . Nasal septum surgery    .  Bowel resection  1980s?    ?colectomy/ostomy - Dr Elesa Hacker  . Ostomy take down  1980s?    Dr Elesa Hacker  . Incisional hernia repair  1980s?    Dr. Lindon Romp - w mesh  . Knee surgery Right   . Incision and drainage abscess N/A 08/14/2013    Procedure: INCISION AND DRAINAGE ABSCESS, mesh ;  Surgeon: [name], MD;  Location: [facility] ORS;  Service: General;  Laterality: N/A;  . Left heart catheterization with coronary angiogram N/A 03/17/2014    Procedure: LEFT HEART CATHETERIZATION WITH CORONARY ANGIOGRAM;  Surgeon: [name], MD;  Location: [facility] CATH LAB;  Service: Cardiovascular;  Laterality: N/A;       History of present illness and  Hospital Course:     Kindly see H&P for history of present illness and admission details, please review complete Labs, Consult reports and Test reports for all details in brief  HPI  from the history and physical done on the day of admission 01/27/2016  HPI: Kurt Cross is a 65 y.o. male with medical history significant of COPD, combined systolic and diastolic heart failure, chronic atrial fibrillation/flutter in the past requiring ventricular pacemaker secondary to severe bradycardia not on anticoagulation possibly due to noncompliance. sleep apnea, morbid obesity, diabetes, hypertension and abdominal aortic aneurysm   Presented with shortness of breath cough productive of sputum fatigue and weakness to point where he has fallen last night. Did not lose Consciousness. His wife has been sick recently and was diagnosed with pneumonia. Patient may have to be on oxygen at home but hasn't been using it. Has not been compliant his breathing treatments. His illness has been associated with sore throat subjective feels chills she denies any chest pain no nausea no vomiting or diarrhea no abdominal pain. He does endorse lower extremity swelling wife noted abrasion to the left calf with increased swelling  Patient had a fall yesterday hitting his left  knee  Regarding pertinent Chronic problems: History of diabetes or compliance had been seen in emergency for hypoglycemia in the past. In 2015 had male use stress test showed EF of 40% with evidence of old inferior wall infarct and moderate peri-infarct ischemia  Patient has complex surgical history required extensive abdominal surgery secondary to large abscess; echo perforation of the rectum resulting in short gut syndrome. Infected prosthetic mesh of abdominal wall requiring removal in 2014.[name] Kitchen He has known abdominal or sick aneurysm measuring 5.5 cm her CAT scan in 2014   IN ER: Found to be hypoxic down to 78% room air. Patient has increased work of breathing. Was noted to be somewhat somnolent in emergency department started on BiPAP Troponin was noted to be elevated to 0.10. BNP 258 Albumin 2.7 Cr 2.42 up from baseline of 1.71 in January  Hospital Course   Kurt Cross is a 65 y.o. male with medical history significant of COPD, combined systolic and diastolic heart failure, chronic atrial fibrillation/flutter in the past requiring ventricular pacemaker secondary to severe bradycardia not on anticoagulation possibly due to noncompliance. sleep apnea, morbid obesity, diabetes, hypertension and abdominal aortic aneurysm. Found to be volume overloaded and started on IV lasix.   Acute on chronic respiratory failure with hypoxia multifactoral due to acute on chronic combined systolic and diastolic CHF, baseline morbid obesity, obstructive sleep apnea. - Improving with diuresis - Continue with oxygen as needed  Acute on chronic combined systolic and diastolic CHF - 2-D echo EF 30-35% - Managed with IV diuresis during hospital stay IV diuresis, managed by cardiology,clinically improved. Good diuresis. -14.9L since admit. Appears to be at dry weight. Now on PO Lasix, 80 mg BID, Stressed daily weights and low sodium diet when discharge home.   - no ACEI or ARB given her renal failure -  Started on Imdur/hydralazine for after load reduction - continue with Coreg and Aldactone  DM -Continue with NPH and insulin sliding scale  Atrial flutter - Per cardiology: consider atrial flutter ablation down the road if LA size is not enlarged however given noncompliance, he will have to demonstrate that he will be compliant with anticoagulation therapy before we consider this. - Chads2vasc score of 4, on Warfarin, INR 2.5 on discharge  Elevated troponin Likely due to demand ischemia   Morbidy obesity/ OSA Encourage life style changes, noncompliant with C Pap  Chronic bradycardia - has pacemaker  Acute on chronic renal failure stage 3 - Baseline renal function  1.8-1.9, currently 2.18, will be discharged back on his home dose Lasix, monitor closely  Cellulitis - Left lower leg cellulitis, significant swelling, but venous Doppler negative for DVT - Initially on Rocephin,transitioned to by mouth Keflex, finish another 3 days as an outpatient   Constipation - Continue with bowel regimen    Discharge Condition:  STABLE   Follow UP  Follow-up Information    Follow up with Dorothy Spark, MD On 02/22/2016.   Specialty:  Cardiology   Why:  10:45 am (Cardiology follow-up)   Contact information:   [address] [place] [address] [phone]       Follow up with [name], MD.   Specialty:  Internal Medicine   Contact information:   [address] [place] [address] [phone]         Discharge Instructions  and  Discharge Medications         Discharge Instructions    Discharge instructions    Complete by:  As directed   Follow with Primary MD [name], MD after discharge from SNF  Get CBC, CMP, 2 view Chest X [place] checked  by Primary MD next visit.    Activity: As tolerated with Full fall precautions use walker/cane & assistance as needed   Disposition snf   Diet: Heart Healthy , carbohydrate  modified, with feeding assistance and aspiration precautions.  For Heart failure patients - Check your Weight same time everyday, if you gain over 2 pounds, or you develop in leg swelling, experience more shortness of breath or chest pain, call your Primary MD immediately. Follow Cardiac Low Salt Diet and 1.5 lit/day fluid restriction.   On your next visit with your primary care physician please Get Medicines reviewed and adjusted.   Please request your Prim.MD to go over all Hospital Tests and Procedure/Radiological results at the follow up, please get all Hospital records sent to your Prim MD by signing hospital release before you go home.   If you experience worsening of your admission symptoms, develop shortness of breath, life threatening emergency, suicidal or homicidal thoughts you must seek medical attention immediately by calling 911 or calling your MD immediately  if symptoms less severe.  You Must read complete instructions/literature along with all the possible adverse reactions/side effects for all the Medicines you take and that have been prescribed to you. Take any new Medicines after you have completely understood and accpet all the possible adverse reactions/side effects.   Do not drive, operating heavy machinery, perform activities at heights, swimming or participation in water activities or provide baby sitting services if your were admitted for syncope or siezures until you have seen by Primary MD or a Neurologist and advised to do so again.  Do not drive when taking Pain medications.    Do not take more than prescribed Pain, Sleep and Anxiety Medications  Special Instructions: If you have smoked or chewed Tobacco  in the last 2 yrs please stop smoking, stop any regular Alcohol  and or any Recreational drug use.  Wear Seat belts while driving.   Please note  You were cared for by a hospitalist during your hospital stay. If you have any questions about your discharge  medications or the care you received while you were in the hospital after you are discharged, you can call the unit and asked to speak with the hospitalist on call if the hospitalist that took care of you is not available. Once you  are discharged, your primary care physician will handle any further medical issues. Please note that NO REFILLS for any discharge medications will be authorized once you are discharged, as it is imperative that you return to your primary care physician (or establish a relationship with a primary care physician if you do not have one) for your aftercare needs so that they can reassess your need for medications and monitor your lab values.     Increase activity slowly    Complete by:  As directed             Medication List    STOP taking these medications        insulin lispro 100 UNIT/ML injection  Commonly known as:  HUMALOG     oxyCODONE-acetaminophen 10-325 MG tablet  Commonly known as:  PERCOCET  Replaced by:  oxyCODONE-acetaminophen 5-325 MG tablet     ramipril 5 MG capsule  Commonly known as:  ALTACE      TAKE these medications        acidophilus Caps capsule  Take 2 capsules by mouth daily.     albuterol (2.5 MG/3ML) 0.083% nebulizer solution  Commonly known as:  PROVENTIL  Take 3 mLs (2.5 mg total) by nebulization every 4 (four) hours as needed for wheezing or shortness of breath.     carvedilol 25 MG tablet  Commonly known as:  COREG  Take 25 mg by mouth 2 (two) times daily.     cephALEXin 500 MG capsule  Commonly known as:  KEFLEX  Take 1 capsule (500 mg total) by mouth every 8 (eight) hours. TAKE  for 3 days then stop     furosemide 80 MG tablet  Commonly known as:  LASIX  Take 80 mg by mouth 2 (two) times daily.     insulin aspart 100 UNIT/ML injection  Commonly known as:  novoLOG  Inject 0-20 Units into the skin 3 (three) times daily with meals.     insulin NPH Human 100 UNIT/ML injection  Commonly known as:  HUMULIN N,NOVOLIN  N  Inject 0.28 mLs (28 Units total) into the skin at bedtime.     isosorbide-hydrALAZINE 20-37.5 MG tablet  Commonly known as:  BIDIL  Take 1 tablet by mouth 3 (three) times daily.     oxyCODONE-acetaminophen 5-325 MG tablet  Commonly known as:  PERCOCET/ROXICET  Take 1 tablet by mouth every 6 (six) hours as needed for moderate pain.     rosuvastatin 10 MG tablet  Commonly known as:  CRESTOR  Take 10 mg by mouth daily.     senna-docusate 8.6-50 MG tablet  Commonly known as:  Senokot-S  Take 2 tablets by mouth 2 (two) times daily.     spironolactone 25 MG tablet  Commonly known as:  ALDACTONE  Take 25 mg by mouth 2 (two) times daily.     warfarin 4 MG tablet  Commonly known as:  COUMADIN  Take 2 tablets (8 mg total) by mouth daily at 6 PM.     warfarin 4 MG tablet  Commonly known as:  COUMADIN  Take 2 tablets (8 mg total) by mouth daily at 6 PM. PLEASE monitor INR and adjust dosing accordingly          Diet and Activity recommendation: See Discharge Instructions above   Consults obtained -  Cardiology   Major procedures and Radiology Reports - PLEASE review detailed and final reports for all details, in brief -      Dg Chest  Port 1 View  01/26/2016  CLINICAL DATA:  severe short of breath for 3 days EXAM: PORTABLE CHEST 1 VIEW COMPARISON:  None. FINDINGS: LEFT-sided pacemaker overlies stable enlarged cardiac silhouette. There is central venous congestion. No focal infiltrate. No pneumothorax. IMPRESSION: Cardiomegaly and central venous congestion. Electronically Signed   By: Suzy Bouchard M.D.   On: 01/26/2016 21:59    Micro Results     Recent Results (from the past 240 hour(s))  MRSA PCR Screening     Status: None   Collection Time: 01/27/16  1:27 AM  Result Value Ref Range Status   MRSA by PCR NEGATIVE NEGATIVE Final    Comment:        The GeneXpert MRSA Assay (FDA approved for NASAL specimens only), is one component of a comprehensive MRSA  colonization surveillance program. It is not intended to diagnose MRSA infection nor to guide or monitor treatment for MRSA infections.        Today   Subjective:   Francoise Schaumann today has no headache,no chest abdominal pain,no new weakness tingling or numbness, feels much better wants to go home today.   Objective:   Blood pressure 119/58, pulse 70, temperature 98.7 F (37.1 C), temperature source Oral, resp. rate 18, height 5\' 11"  (1.803 m), weight 137.984 kg (304 lb 3.2 oz), SpO2 100 %.   Intake/Output Summary (Last 24 hours) at 02/05/16 1357 Last data filed at 02/05/16 1304  Gross per 24 hour  Intake    400 ml  Output   1500 ml  Net  -1100 ml    Exam General exam: Laying comfortable in bed Respiratory system: diminished, no wheezing Cardiovascular system: irr and occasionally fast Gastrointestinal system: Normal bowel sounds heard, obese Central nervous system: Alert and oriented. No focal neurological deficits. Extremities: Symmetric 5 x 5 power. Skin: chronic changes to B/L , Edema resolved and right lower extremity, Minimal trace edema and left lower extremity , very mild erythema  Psychiatry: Judgement and insight appear normal. Mood & affect appropriate.   Data Review   CBC w Diff:  Lab Results  Component Value Date   WBC 12.9* 02/02/2016   HGB 14.3 02/02/2016   HCT 43.4 02/02/2016   PLT 231 02/02/2016   LYMPHOPCT 11 01/26/2016   MONOPCT 5 01/26/2016   EOSPCT 0 01/26/2016   BASOPCT 0 01/26/2016    CMP:  Lab Results  Component Value Date   NA 138 02/05/2016   K 3.8 02/05/2016   CL 94* 02/05/2016   CO2 32 02/05/2016   BUN 30* 02/05/2016   CREATININE 2.18* 02/05/2016   PROT 7.2 01/26/2016   ALBUMIN 2.7* 01/26/2016   BILITOT 1.2 01/26/2016   ALKPHOS 28* 01/26/2016   AST 26 01/26/2016   ALT 16* 01/26/2016  .   Total Time in preparing paper work, data evaluation and todays exam - 35 minutes  Quanta Robertshaw M.D on 02/05/2016 at 1:57  PM  [facility]   Office  [phone]

## 2016-02-05 NOTE — Progress Notes (Signed)
Patient stated that he wasn't sure if he was going to wear CPAP tonight because he didn't wear at home and it was uncomfortable.  RT advised that would come back and check with patient.

## 2016-02-05 NOTE — Progress Notes (Signed)
Patient Profile: 65 y/o morbidly [name] with h/o chronic afib/flutter, symptomatic bradycardia s/p PPM, AAA, HTN, HLD, [id number], combined systolic and diastolic HF and chronic renal insufficiency, admitted for acute respiratory failure, felt to be multifactorial due to acute on chronic CHF, baseline morbid obesity and OSA.   Subjective: Feels good. No dyspnea. We discussed importance of checking weights daily at home and avoiding sodium.   Objective: Vital signs in last 24 hours: Temp:  [98.7 F (37.1 C)-98.8 F (37.1 C)] 98.7 F (37.1 C) (05/01 0537) Pulse Rate:  [70] 70 (05/01 0537) Resp:  [18-20] 18 (05/01 0537) BP: (94-128)/(58-63) 119/58 mmHg (05/01 0537) SpO2:  [95 %-100 %] 100 % (05/01 0537) Weight:  [304 lb 3.2 oz (137.984 kg)] 304 lb 3.2 oz (137.984 kg) (05/01 0543) Last BM Date: 02/04/16  Intake/Output from previous day: 04/30 0701 - 05/01 0700 In: 400 [P.O.:400] Out: 1700 [Urine:1700] Intake/Output this shift:    Medications Current Facility-Administered Medications  Medication Dose Route Frequency Provider Last Rate Last Dose  . 0.9 %  sodium chloride infusion  250 mL Intravenous PRN Toy [name], MD      . acetaminophen (TYLENOL) tablet 650 mg  650 mg Oral Q4H PRN Toy [name], MD   650 mg at 02/02/16 1127  . acidophilus (RISAQUAD) capsule 2 capsule  2 capsule Oral Daily Albertine Patricia, MD   2 capsule at 02/05/16 0858  . albuterol (PROVENTIL) (2.5 MG/3ML) 0.083% nebulizer solution 2.5 mg  2.5 mg Nebulization Q4H PRN Toy [name], MD      . antiseptic oral rinse (CPC / CETYLPYRIDINIUM CHLORIDE 0.05%) solution 7 mL  7 mL Mouth Rinse BID Albertine Patricia, MD   7 mL at 02/05/16 1000  . carvedilol (COREG) tablet 25 mg  25 mg Oral BID WC Geradine Girt, DO   25 mg at 02/05/16 0826  . cephALEXin (KEFLEX) capsule 500 mg  500 mg Oral Q8H Silver Huguenin Elgergawy, MD   500 mg at 02/05/16 0538  . furosemide (LASIX) tablet 80 mg  80 mg Oral BID Jacolyn Reedy, MD   80 mg at 02/05/16 [id number]  . guaiFENesin (MUCINEX) 12 hr tablet 600 mg  600 mg Oral BID Toy [name], MD   600 mg at 02/05/16 0858  . insulin aspart (novoLOG) injection 0-20 Units  0-20 Units Subcutaneous TID WC Geradine Girt, DO   3 Units at 02/05/16 0827  . insulin aspart (novoLOG) injection 0-5 Units  0-5 Units Subcutaneous QHS Geradine Girt, DO   3 Units at 02/04/16 2145  . insulin NPH Human (HUMULIN N,NOVOLIN N) injection 28 Units  28 Units Subcutaneous QHS Albertine Patricia, MD   28 Units at 02/04/16 2141  . ipratropium (ATROVENT) nebulizer solution 0.5 mg  0.5 mg Nebulization Q4H PRN Sherren Mocha, MD      . isosorbide-hydrALAZINE (BIDIL) 20-37.5 MG per tablet 1 tablet  1 tablet Oral TID Sherren Mocha, MD   1 tablet at 02/05/16 [phone]  . magnesium hydroxide (MILK OF MAGNESIA) suspension 30 mL  30 mL Oral Daily PRN Albertine Patricia, MD   30 mL at 01/31/16 1656  . ondansetron (ZOFRAN) injection 4 mg  4 mg Intravenous Q6H PRN Toy [name], MD      . oxyCODONE-acetaminophen (PERCOCET/ROXICET) 5-325 MG per tablet 1 tablet  1 tablet Oral Q6H PRN Toy [name], MD   1 tablet at 02/05/16 0858   And  . oxyCODONE (Oxy IR/ROXICODONE) immediate release tablet 5 mg  5 mg Oral Q6H PRN Toy [name], MD   5 mg at 02/05/16 0858  . rosuvastatin (CRESTOR) tablet 10 mg  10 mg Oral q1800 Toy [name], MD   10 mg at 02/04/16 1753  . senna-docusate (Senokot-S) tablet 2 tablet  2 tablet Oral BID Albertine Patricia, MD   2 tablet at 02/05/16 0858  . sodium chloride flush (NS) 0.9 % injection 3 mL  3 mL Intravenous Q12H Toy [name], MD   3 mL at 02/05/16 0900  . sodium chloride flush (NS) 0.9 % injection 3 mL  3 mL Intravenous PRN Toy [name], MD   3 mL at 01/31/16 2157  . spironolactone (ALDACTONE) tablet 25 mg  25 mg Oral BID Toy [name], MD   25 mg at 02/05/16 0858  . warfarin (COUMADIN) tablet 8 mg  8 mg Oral q1800 Albertine Patricia, MD   8 mg at  02/04/16 1753  . Warfarin - Pharmacist Dosing Inpatient   Does not apply q1800 Lavenia Atlas, [facility]        PE: General appearance: alert, cooperative, no distress and morbidly obese Neck: no carotid bruit and no JVD Lungs: clear to auscultation bilaterally Heart: irregular rhythm, regular rate Extremities: no edema, chronic statis dermatitis bilaterally Pulses: 2+ and symmetric Skin: warm and dry Neurologic: Grossly normal  Lab Results:  No results for input(s): WBC, HGB, HCT, PLT in the last 72 hours. BMET  Recent Labs  02/03/16 0648 02/04/16 0722 02/05/16 0628  NA 138 137 138  K 3.8 3.8 3.8  CL 93* 95* 94*  CO2 32 31 32  GLUCOSE 126* 123* 129*  BUN 26* 30* 30*  CREATININE 1.97* 2.08* 2.18*  CALCIUM 9.2 9.2 9.0   PT/INR  Recent Labs  02/03/16 0648 02/04/16 0722 02/05/16 0628  LABPROT 21.5* 24.3* 27.3*  INR 1.88* 2.20* 2.57*    Assessment/Plan  Active Problems:   Diabetes mellitus, insulin dependent (IDDM), uncontrolled (HCC)   Obstructive sleep apnea   Hypertension   Atrial fibrillation/flutter   Hypoxia   Acute on chronic combined systolic and diastolic CHF, NYHA class 2 (HCC)   Acute on chronic renal failure (HCC)   Acute on chronic respiratory failure (HCC)   Cellulitis   Acute on chronic respiratory failure with hypoxia (HCC)   Elevated troponin   Bradycardia   Typical atrial flutter (HCC)  1. Acute on chronic systolic and diastolic congestive heart failure: clinically improved. Good diuresis. -14.9L since admit. Appears to be at dry weight. Now on PO Lasix, 80 mg BID. MD to assess and will determine home maintenance dose. ? If lasix dose showed be reduced due to renal function. Stressed daily weights and low sodium diet when discharge home.   2. Functioning pacemaker: implanted for symptomatic bradycardia  3. Chronic atrial fibrillation and atrial flutter: rate is controlled on Coreg. Coumadin for anticoagulation.   4. Stage stage III  chronic kidney disease:  Worsening. Baseline SCr 13.-1.7. Admit Scr was 2.42. Trend over the last 3 days: 1.97>>2.08>>2.18. IV lasix stopped yesterday. Now on PO. Will need to continue to monitor. Recommend f/u BMP in 1 week.     LOS: 10 days    Kurt Cross 02/05/2016 11:50 AM  The patient has been seen in conjunction with Kurt Jester, PA-C. All aspects of care have been considered and discussed. The patient has been personally interviewed, examined, and all clinical data has been reviewed.   Agree with note as outlined. With rising creatinine, IV Lasix  should be discontinued. Oral furosemide dosing could potentially be at the outpatient dose level.  Follow kidney function closely.  Discussed the importance of daily weights and reporting if there is gradual or abrupt increase in weight above the dry weight by greater than 3 pounds.  We'll continue to follow with you.

## 2016-02-05 NOTE — Progress Notes (Signed)
ANTICOAGULATION CONSULT NOTE - Follow Up Consult  Pharmacy Consult for Coumadin Indication: atrial fibrillation  No Known Allergies  Patient Measurements: Height: 5\' 11"  (180.3 cm) Weight: (!) 304 lb 3.2 oz (137.984 kg) IBW/kg (Calculated) : 75.3   Vital Signs: Temp: 98.7 F (37.1 C) (05/01 0537) Temp Source: Oral (05/01 0537) BP: 119/58 mmHg (05/01 0537) Pulse Rate: 70 (05/01 0537)  Labs:  Recent Labs  02/03/16 0648 02/04/16 0722 02/05/16 0628  LABPROT 21.5* 24.3* 27.3*  INR 1.88* 2.20* 2.57*  CREATININE 1.97* 2.08* 2.18*    Estimated Creatinine Clearance: 49.9 mL/min (by C-G formula based on Cr of 2.18).   Medications:  Scheduled:  . acidophilus  2 capsule Oral Daily  . antiseptic oral rinse  7 mL Mouth Rinse BID  . carvedilol  25 mg Oral BID WC  . cephALEXin  500 mg Oral Q8H  . furosemide  80 mg Oral BID  . guaiFENesin  600 mg Oral BID  . insulin aspart  0-20 Units Subcutaneous TID WC  . insulin aspart  0-5 Units Subcutaneous QHS  . insulin NPH Human  28 Units Subcutaneous QHS  . isosorbide-hydrALAZINE  1 tablet Oral TID  . rosuvastatin  10 mg Oral q1800  . senna-docusate  2 tablet Oral BID  . sodium chloride flush  3 mL Intravenous Q12H  . spironolactone  25 mg Oral BID  . warfarin  8 mg Oral q1800  . Warfarin - Pharmacist Dosing Inpatient   Does not apply q1800    Assessment: 65yo male with AFib. Today INR =  2.2>2.57. Will continue standing dose today and continue to follow daily INRs.   Goal of Therapy:  INR 2-3 Monitor platelets by anticoagulation protocol: Yes   Plan:  Warfarin 8mg  PO qday Monitor daily INR, CBC, clinical course, s/sx of bleed, PO intake, DDI  Thank you for allowing [place] to participate in this patients care. Jens Som, PharmD Pager: [phone] 02/05/2016 11:48 AM

## 2016-02-05 NOTE — Progress Notes (Signed)
Physical Therapy Treatment Patient Details Name: PELHAM SAILE MRN: [id number] DOB: [birth year] Today's Date: 02/05/2016    History of Present Illness TAN CHRETIEN is a 65 y.o. male with medical history significant of COPD, combined systolic and diastolic heart failure, chronic atrial fibrillation/flutter in the past requiring ventricular pacemaker secondary to severe bradycardia not on anticoagulation possibly due to noncompliance. sleep apnea, morbid obesity, diabetes, hypertension and abdominal aortic aneurysm. Found to be volume overloaded and started on IV lasix.     PT Comments    Patient continues to make gradual progress toward mobility goals with increased activity tolerance and decreased c/o pain. Current plan remains appropriate.   Follow Up Recommendations  SNF;Supervision/Assistance - 24 hour     Equipment Recommendations  Other (comment) (TBA)    Recommendations for Other Services       Precautions / Restrictions Precautions Precautions: Fall Restrictions Weight Bearing Restrictions: No    Mobility  Bed Mobility               General bed mobility comments: OOB in chair upon arrival  Transfers Overall transfer level: Needs assistance Equipment used: Rolling walker (2 wheeled) Transfers: Sit to/from Stand Sit to Stand: Mod assist;Max assist;+2 physical assistance         General transfer comment: sit to stand X 2 trials with mod/max A +2 required with cues for hand placement and technique; assist for safe descent to chair  Ambulation/Gait Ambulation/Gait assistance: Min assist;+2 safety/equipment Ambulation Distance (Feet): 40 Feet Assistive device: Rolling walker (2 wheeled) Gait Pattern/deviations: Step-to pattern;Decreased stride length;Decreased dorsiflexion - right;Decreased dorsiflexion - left;Trunk flexed;Wide base of support;Ataxic Gait velocity: decreased   General Gait Details: cues for posture/righting eyes, position of RW, and bilat  heel strike; pt continues to demo ataxic gait but with improvement noted; min A for balance and guidance of RW at times especially with turning; increaed trunk flexion with fatigue   Stairs            Wheelchair Mobility    Modified Rankin (Stroke Patients Only)       Balance     Sitting balance-Leahy Scale: Good       Standing balance-Leahy Scale: Poor                      Cognition Arousal/Alertness: Awake/alert Behavior During Therapy: WFL for tasks assessed/performed Overall Cognitive Status: Within Functional Limits for tasks assessed                      Exercises      General Comments        Pertinent Vitals/Pain Pain Assessment: Faces Faces Pain Scale: No hurt Pain Intervention(s): Monitored during session;RN gave pain meds during session    Home Living                      Prior Function            PT Goals (current goals can now be found in the care plan section) Acute Rehab PT Goals Patient Stated Goal: be able to watch son's football games Progress towards PT goals: Progressing toward goals    Frequency  Min 3X/week    PT Plan Current plan remains appropriate    Co-evaluation             End of Session Equipment Utilized During Treatment: Gait belt Activity Tolerance: Patient tolerated treatment well Patient left: in chair;with call bell/phone  within reach     Time: 1530-1551 PT Time Calculation (min) (ACUTE ONLY): 21 min  Charges:  $Gait Training: 8-22 mins                    G Codes:      Salina April, PTA Pager: [phone]   02/05/2016, 4:10 PM

## 2016-02-06 DIAGNOSIS — N19 Unspecified kidney failure: Secondary | ICD-10-CM | POA: Diagnosis not present

## 2016-02-06 DIAGNOSIS — M79604 Pain in right leg: Secondary | ICD-10-CM | POA: Diagnosis not present

## 2016-02-06 DIAGNOSIS — R0902 Hypoxemia: Secondary | ICD-10-CM | POA: Diagnosis not present

## 2016-02-06 DIAGNOSIS — E1065 Type 1 diabetes mellitus with hyperglycemia: Secondary | ICD-10-CM | POA: Diagnosis not present

## 2016-02-06 DIAGNOSIS — R093 Abnormal sputum: Secondary | ICD-10-CM | POA: Diagnosis not present

## 2016-02-06 DIAGNOSIS — E785 Hyperlipidemia, unspecified: Secondary | ICD-10-CM | POA: Diagnosis not present

## 2016-02-06 DIAGNOSIS — J9611 Chronic respiratory failure with hypoxia: Secondary | ICD-10-CM | POA: Diagnosis not present

## 2016-02-06 DIAGNOSIS — G4733 Obstructive sleep apnea (adult) (pediatric): Secondary | ICD-10-CM | POA: Diagnosis not present

## 2016-02-06 DIAGNOSIS — E1010 Type 1 diabetes mellitus with ketoacidosis without coma: Secondary | ICD-10-CM | POA: Diagnosis not present

## 2016-02-06 DIAGNOSIS — R001 Bradycardia, unspecified: Secondary | ICD-10-CM | POA: Diagnosis not present

## 2016-02-06 DIAGNOSIS — I5043 Acute on chronic combined systolic (congestive) and diastolic (congestive) heart failure: Secondary | ICD-10-CM | POA: Diagnosis not present

## 2016-02-06 DIAGNOSIS — R7989 Other specified abnormal findings of blood chemistry: Secondary | ICD-10-CM | POA: Diagnosis not present

## 2016-02-06 DIAGNOSIS — I5042 Chronic combined systolic (congestive) and diastolic (congestive) heart failure: Secondary | ICD-10-CM | POA: Diagnosis not present

## 2016-02-06 DIAGNOSIS — I10 Essential (primary) hypertension: Secondary | ICD-10-CM

## 2016-02-06 DIAGNOSIS — J069 Acute upper respiratory infection, unspecified: Secondary | ICD-10-CM | POA: Diagnosis not present

## 2016-02-06 DIAGNOSIS — R5381 Other malaise: Secondary | ICD-10-CM | POA: Diagnosis not present

## 2016-02-06 DIAGNOSIS — N390 Urinary tract infection, site not specified: Secondary | ICD-10-CM | POA: Diagnosis not present

## 2016-02-06 DIAGNOSIS — I481 Persistent atrial fibrillation: Secondary | ICD-10-CM | POA: Diagnosis not present

## 2016-02-06 DIAGNOSIS — I4891 Unspecified atrial fibrillation: Secondary | ICD-10-CM | POA: Diagnosis not present

## 2016-02-06 DIAGNOSIS — R05 Cough: Secondary | ICD-10-CM | POA: Diagnosis not present

## 2016-02-06 DIAGNOSIS — K469 Unspecified abdominal hernia without obstruction or gangrene: Secondary | ICD-10-CM | POA: Diagnosis not present

## 2016-02-06 DIAGNOSIS — J9621 Acute and chronic respiratory failure with hypoxia: Secondary | ICD-10-CM | POA: Diagnosis not present

## 2016-02-06 DIAGNOSIS — I483 Typical atrial flutter: Secondary | ICD-10-CM | POA: Diagnosis not present

## 2016-02-06 DIAGNOSIS — L0390 Cellulitis, unspecified: Secondary | ICD-10-CM | POA: Diagnosis not present

## 2016-02-06 LAB — GLUCOSE, CAPILLARY
GLUCOSE-CAPILLARY: 224 mg/dL — AB (ref 65–99)
GLUCOSE-CAPILLARY: 61 mg/dL — AB (ref 65–99)
GLUCOSE-CAPILLARY: 74 mg/dL (ref 65–99)
Glucose-Capillary: 200 mg/dL — ABNORMAL HIGH (ref 65–99)

## 2016-02-06 LAB — BASIC METABOLIC PANEL
Anion gap: 12 (ref 5–15)
BUN: 33 mg/dL — AB (ref 6–20)
CHLORIDE: 96 mmol/L — AB (ref 101–111)
CO2: 31 mmol/L (ref 22–32)
Calcium: 9.1 mg/dL (ref 8.9–10.3)
Creatinine, Ser: 2.31 mg/dL — ABNORMAL HIGH (ref 0.61–1.24)
GFR calc non Af Amer: 29 mL/min — ABNORMAL LOW (ref >60)
GFR, EST AFRICAN AMERICAN: 33 mL/min — AB (ref >60)
Glucose, Bld: 62 mg/dL — ABNORMAL LOW (ref 65–99)
POTASSIUM: 4.5 mmol/L (ref 3.5–5.1)
SODIUM: 139 mmol/L (ref 135–145)

## 2016-02-06 LAB — PROTIME-INR
INR: 2.88 — ABNORMAL HIGH (ref 0.00–1.49)
Prothrombin Time: 29.7 seconds — ABNORMAL HIGH (ref 11.6–15.2)

## 2016-02-06 MED ORDER — FUROSEMIDE 80 MG PO TABS: 80.0000 mg | ORAL_TABLET | Freq: Every day | ORAL | Status: DC

## 2016-02-06 MED ORDER — FUROSEMIDE 40 MG PO TABS: 40.0000 mg | ORAL_TABLET | Freq: Two times a day (BID) | ORAL | Status: DC

## 2016-02-06 MED ORDER — FUROSEMIDE 40 MG PO TABS: 40.0000 mg | ORAL_TABLET | Freq: Every day | ORAL | Status: DC

## 2016-02-06 MED ORDER — FUROSEMIDE 80 MG PO TABS: 80.0000 mg | ORAL_TABLET | Freq: Two times a day (BID) | ORAL | Status: DC

## 2016-02-06 MED ORDER — WARFARIN SODIUM 4 MG PO TABS: 6.0000 mg | ORAL_TABLET | Freq: Every day | ORAL | Status: DC

## 2016-02-06 NOTE — Progress Notes (Signed)
ANTICOAGULATION CONSULT NOTE - Follow Up Consult  Pharmacy Consult for Coumadin Indication: atrial fibrillation  No Known Allergies  Patient Measurements: Height: 5\' 11"  (180.3 cm) Weight: (!) 301 lb 6.4 oz (136.714 kg) IBW/kg (Calculated) : 75.3   Vital Signs: Temp: 98.4 F (36.9 C) (05/02 0525) Temp Source: Oral (05/01 2158) BP: 103/53 mmHg (05/02 0525) Pulse Rate: 68 (05/02 0526)  Labs:  Recent Labs  02/04/16 0722 02/05/16 0628 02/06/16 0649  LABPROT 24.3* 27.3* 29.7*  INR 2.20* 2.57* 2.88*  CREATININE 2.08* 2.18* 2.31*    Estimated Creatinine Clearance: 46.9 mL/min (by C-G formula based on Cr of 2.31).   Medications:  Scheduled:  . acidophilus  2 capsule Oral Daily  . antiseptic oral rinse  7 mL Mouth Rinse BID  . carvedilol  25 mg Oral BID WC  . cephALEXin  500 mg Oral Q8H  . furosemide  80 mg Oral BID  . guaiFENesin  600 mg Oral BID  . insulin aspart  0-20 Units Subcutaneous TID WC  . insulin aspart  0-5 Units Subcutaneous QHS  . insulin NPH Human  28 Units Subcutaneous QHS  . isosorbide-hydrALAZINE  1 tablet Oral TID  . rosuvastatin  10 mg Oral q1800  . senna-docusate  2 tablet Oral BID  . sodium chloride flush  3 mL Intravenous Q12H  . spironolactone  25 mg Oral BID  . warfarin  8 mg Oral q1800  . Warfarin - Pharmacist Dosing Inpatient   Does not apply q1800    Assessment: 65 yo male with AFib.  INR =  2.8, pt should be at steady state by now.   Goal of Therapy:  INR 2-3 Monitor platelets by anticoagulation protocol: Yes    Plan:  Warfarin 8 mg po x1 Daily INR     Hughes Better, PharmD, BCPS Clinical Pharmacist 02/06/2016 8:31 AM

## 2016-02-06 NOTE — Progress Notes (Signed)
Attempted report x2 to [facility]  1:40 PM Report given to Rica Records , LPN at [facility] given to wife who is transporting patient.

## 2016-02-06 NOTE — Progress Notes (Signed)
  Hypoglycemic Event  CBG: 61  Treatment: 15 GM carbohydrate snack  Symptoms: Hungry  Follow-up CBG: Time:0811 CBG Result:74  Possible Reasons for Event: Unknown   Kurt Cross, [place] God[place] C

## 2016-02-06 NOTE — Progress Notes (Signed)
[facility] will DC to: [facility] (Report#: [phone]) Anticipated DC date: 02/06/16 Family notified: Spouse Transport by: Spouse coming after 3pm   Per MD patient ready for DC to [place]. RN, patient, patient's family, and facility notified of DC. RN given number for report. DC packet on chart.   CSW signing off.  Cedric Fishman, [place] Social Worker [phone]

## 2016-02-06 NOTE — Clinical Social Work Placement (Signed)
   CLINICAL SOCIAL WORK PLACEMENT  NOTE  Date:  02/06/2016  Patient Details  Name: Kurt Cross MRN: [id number] Date of Birth: [birth year]  Clinical Social Work is seeking post-discharge placement for this patient at the [place] level of care (*CSW will initial, date and re-position this form in  chart as items are completed):  Yes   Patient/family provided with [place] Work Department's list of facilities offering this level of care within the geographic area requested by the patient (or if unable, by the patient's family).  Yes   Patient/family informed of their freedom to choose among providers that offer the needed level of care, that participate in Medicare, Medicaid or managed care program needed by the patient, have an available bed and are willing to accept the patient.  Yes   Patient/family informed of [facility]'s ownership interest in [facility] and [facility], as well as of the fact that they are under no obligation to receive care at these facilities.  PASRR submitted to EDS on       PASRR number received on       Existing PASRR number confirmed on 02/01/16     FL2 transmitted to all facilities in geographic area requested by pt/family on 02/01/16     FL2 transmitted to all facilities within larger geographic area on       Patient informed that his/her managed care company has contracts with or will negotiate with certain facilities, including the following:        Yes   Patient/family informed of bed offers received.  Patient chooses bed at [facility]     Physician recommends and patient chooses bed at      Patient to be transferred to [place] on 02/06/16.  Patient to be transferred to facility by car     Patient family notified on 02/06/16 of transfer.  Name of family member notified:  [name]     PHYSICIAN       Additional Comment:    _______________________________________________ Benard Halsted,  [place] 02/06/2016, 11:42 AM

## 2016-02-06 NOTE — Discharge Summary (Addendum)
Kurt Cross, is a 65 y.o. male  DOB Feb 22, 1951  MRN [id number].  Admission date:  01/26/2016  Admitting Physician  Toy [name], MD  Discharge Date:  02/06/2016   Primary MD  [name], MD  - Significant headaches from discharge summary yesterday has been added to discharge summary, which include adjustment of Lasix dose, and further recommendation regarding lab checks.  Recommendations for primary care physician for things to follow:  - Patient was started on warfarin during hospital stay,  monitor his INR and adjust warfarin dose closely. - Patient to continue C Pap at bedtime Auto CPAP 6-20 CMH2O via FFM with 2 LPM O2 bleed in - Please check CBC, BMP in 3 days, he will be discharged on Lasix 40 mg by mouth daily given creatinine today of 2.3, if it is improving and back to baseline 1.8 -  1.9, and he can be resumed back on his home dose of 80 mg oral daily  Admission Diagnosis  Cough [R05]   Discharge Diagnosis  Cough [R05]    Active Problems:   Diabetes mellitus, insulin dependent (IDDM), uncontrolled (HCC)   Obstructive sleep apnea   Hypertension   Atrial fibrillation/flutter   Hypoxia   Acute on chronic combined systolic and diastolic CHF, NYHA class 2 (HCC)   Acute on chronic renal failure (HCC)   Acute on chronic respiratory failure (HCC)   Cellulitis   Acute on chronic respiratory failure with hypoxia (HCC)   Elevated troponin   Bradycardia   Typical atrial flutter (HCC)      Past Medical History  Diagnosis Date  . Sleep apnea   . Diabetes mellitus, type 2 ([place])   . Hypertension   . Hyperlipidemia   . CHF (congestive heart failure), NYHA class II ([place])   . Hernia of abdominal cavity   . AAA (abdominal aortic aneurysm)/ 4.5 cm ascending per CT angio 08/01/13 08/12/2013  . 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  Constipation, chronic 08/03/2013  . Benign hypertension   . Hypokalemia   . OSA (obstructive sleep apnea)   . Neuropathy ([place])   . Hx of Mobitz type II block     s/p PPM Bi-Ven  . Atrial fibrillation [place])     Past Surgical History  Procedure Laterality Date  . Appendectomy    . Tonsillectomy and adenoidectomy    . Nasal septum surgery    . Bowel resection  1980s?    ?colectomy/ostomy - Dr Elesa Hacker  . Ostomy take down  1980s?    Dr Elesa Hacker  . Incisional hernia repair  1980s?    Dr. Lindon Romp - w mesh  . Knee surgery Right   . Incision and drainage abscess N/A 08/14/2013    Procedure: INCISION AND DRAINAGE ABSCESS, mesh ;  Surgeon: [name], MD;  Location: [facility] ORS;  Service: General;  Laterality: N/A;  . Left heart catheterization with coronary angiogram N/A 03/17/2014    Procedure: LEFT HEART CATHETERIZATION  WITH CORONARY ANGIOGRAM;  Surgeon: [name], MD;  Location: [facility] CATH LAB;  Service: Cardiovascular;  Laterality: N/A;       History of present illness and  Hospital Course:     Kindly see H&P for history of present illness and admission details, please review complete Labs, Consult reports and Test reports for all details in brief  HPI  from the history and physical done on the day of admission 01/27/2016  HPI: Kurt Cross is a 65 y.o. male with medical history significant of COPD, combined systolic and diastolic heart failure, chronic atrial fibrillation/flutter in the past requiring ventricular pacemaker secondary to severe bradycardia not on anticoagulation possibly due to noncompliance. sleep apnea, morbid obesity, diabetes, hypertension and abdominal aortic aneurysm   Presented with shortness of breath cough productive of sputum fatigue and weakness to point where he has fallen last night. Did not lose Consciousness. His wife has been sick recently and was diagnosed with pneumonia. Patient may have to be on oxygen at home but hasn't been using it. Has  not been compliant his breathing treatments. His illness has been associated with sore throat subjective feels chills she denies any chest pain no nausea no vomiting or diarrhea no abdominal pain. He does endorse lower extremity swelling wife noted abrasion to the left calf with increased swelling  Patient had a fall yesterday hitting his left knee  Regarding pertinent Chronic problems: History of diabetes or compliance had been seen in emergency for hypoglycemia in the past. In 2015 had male use stress test showed EF of 40% with evidence of old inferior wall infarct and moderate peri-infarct ischemia  Patient has complex surgical history required extensive abdominal surgery secondary to large abscess; echo perforation of the rectum resulting in short gut syndrome. Infected prosthetic mesh of abdominal wall requiring removal in 2014.[name] Kitchen He has known abdominal or sick aneurysm measuring 5.5 cm her CAT scan in 2014   IN ER: Found to be hypoxic down to 78% room air. Patient has increased work of breathing. Was noted to be somewhat somnolent in emergency department started on BiPAP Troponin was noted to be elevated to 0.10. BNP 258 Albumin 2.7 Cr 2.42 up from baseline of 1.71 in January  Hospital Course   Kurt Cross is a 65 y.o. male with medical history significant of COPD, combined systolic and diastolic heart failure, chronic atrial fibrillation/flutter in the past requiring ventricular pacemaker secondary to severe bradycardia not on anticoagulation possibly due to noncompliance. sleep apnea, morbid obesity, diabetes, hypertension and abdominal aortic aneurysm. Found to be volume overloaded and started on IV lasix.   Acute on chronic respiratory failure with hypoxia multifactoral due to acute on chronic combined systolic and diastolic CHF, baseline morbid obesity, obstructive sleep apnea. - Improving with diuresis - Continue with oxygen as needed  Acute on chronic combined systolic and  diastolic CHF - 2-D echo EF 30-35% - Managed with IV diuresis during hospital stay IV diuresis, managed by cardiology,clinically improved. Good diuresis. -16.3L since admit. Appears to be at dry weight. Now on PO Lasix,  Stressed daily weights and low sodium diet when discharge home.   - Patient home dose Lasix 80 mg by mouth twice a day, creatinine today is 2.3, up from 2.18 yesterday, so the dose has been decreased, will be discharged on 40 mg daily, will need repeat BMP in 3 days, if renal function back to baseline 1.8-1.9, then he can be resumed back at his home dose which is  80 mg oral daily. - no ACEI or ARB given her renal failure - Started on Imdur/hydralazine for after load reduction - continue with Coreg  - To neutral hold Aldactone and discharge, decision about resumption can be made by cardiology on follow-up if renal function remained stable  DM -Continue with NPH and insulin sliding scale  Atrial flutter - Per cardiology: consider atrial flutter ablation down the road if LA size is not enlarged however given noncompliance, he will have to demonstrate that he will be compliant with anticoagulation therapy before we consider this. - Chads2vasc score of 4, on Warfarin, INR 2.8 on discharge  Elevated troponin Likely due to demand ischemia   Morbidy obesity/ OSA Encourage life style changes, noncompliant with C Pap  Chronic bradycardia - has pacemaker  Acute on chronic renal failure stage 3 - Baseline renal function 1.8-1.9, currently 2.3 , will discharge on the Lasix 40 mg oral daily and repeat BMP in 3 days.  Cellulitis - Left lower leg cellulitis, significant swelling, but venous Doppler negative for DVT - Initially on Rocephin,transitioned to by mouth Keflex, finish another 3 days as an outpatient   Constipation - Continue with bowel regimen    Discharge Condition:  STABLE   Follow UP  Follow-up Information    Follow up with Dorothy Spark, MD On  02/22/2016.   Specialty:  Cardiology   Why:  10:45 am (Cardiology follow-up)   Contact information:   [address] [place] [address] [phone]       Follow up with [name], MD.   Specialty:  Internal Medicine   Contact information:   [address] [place] [address] [phone]         Discharge Instructions  and  Discharge Medications         Discharge Instructions    Discharge instructions    Complete by:  As directed   Follow with Primary MD [name], MD after discharge from SNF  Get CBC, CMP, 2 view Chest X Stocks checked  by Primary MD next visit.    Activity: As tolerated with Full fall precautions use walker/cane & assistance as needed   Disposition snf   Diet: Heart Healthy , carbohydrate modified, with feeding assistance and aspiration precautions.  For Heart failure patients - Check your Weight same time everyday, if you gain over 2 pounds, or you develop in leg swelling, experience more shortness of breath or chest pain, call your Primary MD immediately. Follow Cardiac Low Salt Diet and 1.5 lit/day fluid restriction.   On your next visit with your primary care physician please Get Medicines reviewed and adjusted.   Please request your Prim.MD to go over all Hospital Tests and Procedure/Radiological results at the follow up, please get all Hospital records sent to your Prim MD by signing hospital release before you go home.   If you experience worsening of your admission symptoms, develop shortness of breath, life threatening emergency, suicidal or homicidal thoughts you must seek medical attention immediately by calling 911 or calling your MD immediately  if symptoms less severe.  You Must read complete instructions/literature along with all the possible adverse reactions/side effects for all the Medicines you take and that have been prescribed to you. Take any new Medicines after you have completely understood  and accpet all the possible adverse reactions/side effects.   Do not drive, operating heavy machinery, perform activities at heights, swimming or participation in water activities or provide baby  sitting services if your were admitted for syncope or siezures until you have seen by Primary MD or a Neurologist and advised to do so again.  Do not drive when taking Pain medications.    Do not take more than prescribed Pain, Sleep and Anxiety Medications  Special Instructions: If you have smoked or chewed Tobacco  in the last 2 yrs please stop smoking, stop any regular Alcohol  and or any Recreational drug use.  Wear Seat belts while driving.   Please note  You were cared for by a hospitalist during your hospital stay. If you have any questions about your discharge medications or the care you received while you were in the hospital after you are discharged, you can call the unit and asked to speak with the hospitalist on call if the hospitalist that took care of you is not available. Once you are discharged, your primary care physician will handle any further medical issues. Please note that NO REFILLS for any discharge medications will be authorized once you are discharged, as it is imperative that you return to your primary care physician (or establish a relationship with a primary care physician if you do not have one) for your aftercare needs so that they can reassess your need for medications and monitor your lab values.     Increase activity slowly    Complete by:  As directed             Medication List    STOP taking these medications        insulin lispro 100 UNIT/ML injection  Commonly known as:  HUMALOG     oxyCODONE-acetaminophen 10-325 MG tablet  Commonly known as:  PERCOCET  Replaced by:  oxyCODONE-acetaminophen 5-325 MG tablet     ramipril 5 MG capsule  Commonly known as:  ALTACE     spironolactone 25 MG tablet  Commonly known as:  ALDACTONE      TAKE these  medications        acidophilus Caps capsule  Take 2 capsules by mouth daily.     albuterol (2.5 MG/3ML) 0.083% nebulizer solution  Commonly known as:  PROVENTIL  Take 3 mLs (2.5 mg total) by nebulization every 4 (four) hours as needed for wheezing or shortness of breath.     carvedilol 25 MG tablet  Commonly known as:  COREG  Take 25 mg by mouth 2 (two) times daily.     cephALEXin 500 MG capsule  Commonly known as:  KEFLEX  Take 1 capsule (500 mg total) by mouth every 8 (eight) hours. TAKE  for 3 days then stop     furosemide 40 MG tablet  Commonly known as:  LASIX  Take 1 tablet (40 mg total) by mouth daily.  Start taking on:  02/07/2016     insulin aspart 100 UNIT/ML injection  Commonly known as:  novoLOG  Inject 0-20 Units into the skin 3 (three) times daily with meals.     insulin NPH Human 100 UNIT/ML injection  Commonly known as:  HUMULIN N,NOVOLIN N  Inject 0.28 mLs (28 Units total) into the skin at bedtime.     isosorbide-hydrALAZINE 20-37.5 MG tablet  Commonly known as:  BIDIL  Take 1 tablet by mouth 3 (three) times daily.     oxyCODONE-acetaminophen 5-325 MG tablet  Commonly known as:  PERCOCET/ROXICET  Take 1 tablet by mouth every 6 (six) hours as needed for moderate pain.     rosuvastatin 10 MG tablet  Commonly  known as:  CRESTOR  Take 10 mg by mouth daily.     senna-docusate 8.6-50 MG tablet  Commonly known as:  Senokot-S  Take 2 tablets by mouth 2 (two) times daily.     warfarin 4 MG tablet  Commonly known as:  COUMADIN  Take 1.5 tablets (6 mg total) by mouth daily at 6 PM.          Diet and Activity recommendation: See Discharge Instructions above   Consults obtained -  Cardiology   Major procedures and Radiology Reports - PLEASE review detailed and final reports for all details, in brief -      Dg Chest Port 1 View  01/26/2016  CLINICAL DATA:  severe short of breath for 3 days EXAM: PORTABLE CHEST 1 VIEW COMPARISON:  None. FINDINGS:  LEFT-sided pacemaker overlies stable enlarged cardiac silhouette. There is central venous congestion. No focal infiltrate. No pneumothorax. IMPRESSION: Cardiomegaly and central venous congestion. Electronically Signed   By: Suzy Bouchard M.D.   On: 01/26/2016 21:59    Micro Results     No results found for this or any previous visit (from the past 240 hour(s)).     Today   Subjective:   Kurt Cross today has no headache,no chest abdominal pain,no new weakness tingling or numbness, feels much better wants to go home today.   Objective:   Blood pressure 103/53, pulse 68, temperature 98.4 F (36.9 C), temperature source Oral, resp. rate 18, height 5\' 11"  (1.803 m), weight 136.714 kg (301 lb 6.4 oz), SpO2 92 %.   Intake/Output Summary (Last 24 hours) at 02/06/16 1102 Last data filed at 02/05/16 2342  Gross per 24 hour  Intake      3 ml  Output   1400 ml  Net  -1397 ml    Exam General exam: Laying comfortable in bed Respiratory system: diminished, no wheezing Cardiovascular system: irr and occasionally fast Gastrointestinal system: Normal bowel sounds heard, obese Central nervous system: Alert and oriented. No focal neurological deficits. Extremities: Symmetric 5 x 5 power. Skin: chronic changes to B/L , Edema resolved and right lower extremity, Minimal trace edema and left lower extremity , very mild erythema  Psychiatry: Judgement and insight appear normal. Mood & affect appropriate.   Data Review   CBC w Diff:  Lab Results  Component Value Date   WBC 12.9* 02/02/2016   HGB 14.3 02/02/2016   HCT 43.4 02/02/2016   PLT 231 02/02/2016   LYMPHOPCT 11 01/26/2016   MONOPCT 5 01/26/2016   EOSPCT 0 01/26/2016   BASOPCT 0 01/26/2016    CMP:  Lab Results  Component Value Date   NA 139 02/06/2016   K 4.5 02/06/2016   CL 96* 02/06/2016   CO2 31 02/06/2016   BUN 33* 02/06/2016   CREATININE 2.31* 02/06/2016   PROT 7.2 01/26/2016   ALBUMIN 2.7* 01/26/2016    BILITOT 1.2 01/26/2016   ALKPHOS 28* 01/26/2016   AST 26 01/26/2016   ALT 16* 01/26/2016  .   Total Time in preparing paper work, data evaluation and todays exam - 35 minutes  Sherrye Puga M.D on 02/06/2016 at 11:02 AM  [facility]   Office  [phone]

## 2016-02-06 NOTE — Progress Notes (Addendum)
Patient Profile: 65 y/o morbidly [name] with h/o chronic afib/flutter, symptomatic bradycardia s/p PPM, AAA, HTN, HLD, [id number], combined systolic and diastolic HF and chronic renal insufficiency, admitted for acute respiratory failure, felt to be multifactorial due to acute on chronic CHF, baseline morbid obesity and OSA.   Subjective: Pt has no complaints, state breathing has improved, having good urine output. Counseled on medication compliance w/ coumadin.    Objective: Vital signs in last 24 hours: Temp:  [97.7 F (36.5 C)-98.4 F (36.9 C)] 98.4 F (36.9 C) (05/02 0525) Pulse Rate:  [68-138] 68 (05/02 0526) Resp:  [16-20] 18 (05/02 0525) BP: (103-130)/(53-81) 103/53 mmHg (05/02 0525) SpO2:  [84 %-98 %] 92 % (05/02 0526) Weight:  [301 lb 6.4 oz (136.714 kg)] 301 lb 6.4 oz (136.714 kg) (05/02 0500) Last BM Date: 02/05/16  Intake/Output from previous day: 05/01 0701 - 05/02 0700 In: 3 [I.V.:3] Out: 1400 [Urine:1400] Intake/Output this shift:    Medications Current Facility-Administered Medications  Medication Dose Route Frequency Provider Last Rate Last Dose  . 0.9 %  sodium chloride infusion  250 mL Intravenous PRN Toy [name], MD      . acetaminophen (TYLENOL) tablet 650 mg  650 mg Oral Q4H PRN Toy [name], MD   650 mg at 02/06/16 0228  . acidophilus (RISAQUAD) capsule 2 capsule  2 capsule Oral Daily Albertine Patricia, MD   2 capsule at 02/06/16 0852  . albuterol (PROVENTIL) (2.5 MG/3ML) 0.083% nebulizer solution 2.5 mg  2.5 mg Nebulization Q4H PRN Toy [name], MD      . antiseptic oral rinse (CPC / CETYLPYRIDINIUM CHLORIDE 0.05%) solution 7 mL  7 mL Mouth Rinse BID Albertine Patricia, MD   7 mL at 02/06/16 0853  . carvedilol (COREG) tablet 25 mg  25 mg Oral BID WC Geradine Girt, DO   25 mg at 02/06/16 0853  . cephALEXin (KEFLEX) capsule 500 mg  500 mg Oral Q8H Silver Huguenin Elgergawy, MD   500 mg at 02/06/16 0527  . furosemide (LASIX) tablet 40 mg  40  mg Oral BID Albertine Patricia, MD      . guaiFENesin (MUCINEX) 12 hr tablet 600 mg  600 mg Oral BID Toy [name], MD   600 mg at 02/06/16 0853  . insulin aspart (novoLOG) injection 0-20 Units  0-20 Units Subcutaneous TID WC Geradine Girt, DO   7 Units at 02/05/16 1720  . insulin aspart (novoLOG) injection 0-5 Units  0-5 Units Subcutaneous QHS Geradine Girt, DO   2 Units at 02/05/16 2242  . insulin NPH Human (HUMULIN N,NOVOLIN N) injection 28 Units  28 Units Subcutaneous QHS Albertine Patricia, MD   28 Units at 02/05/16 2243  . ipratropium (ATROVENT) nebulizer solution 0.5 mg  0.5 mg Nebulization Q4H PRN Sherren Mocha, MD      . isosorbide-hydrALAZINE (BIDIL) 20-37.5 MG per tablet 1 tablet  1 tablet Oral TID Sherren Mocha, MD   1 tablet at 02/06/16 [id number]  . magnesium hydroxide (MILK OF MAGNESIA) suspension 30 mL  30 mL Oral Daily PRN Albertine Patricia, MD   30 mL at 01/31/16 1656  . ondansetron (ZOFRAN) injection 4 mg  4 mg Intravenous Q6H PRN Toy [name], MD      . oxyCODONE-acetaminophen (PERCOCET/ROXICET) 5-325 MG per tablet 1 tablet  1 tablet Oral Q6H PRN Toy [name], MD   1 tablet at 02/06/16 0411   And  . oxyCODONE (Oxy IR/ROXICODONE) immediate release tablet 5 mg  5 mg Oral Q6H PRN Toy [name], MD   5 mg at 02/06/16 0411  . rosuvastatin (CRESTOR) tablet 10 mg  10 mg Oral q1800 Toy [name], MD   10 mg at 02/05/16 1720  . senna-docusate (Senokot-S) tablet 2 tablet  2 tablet Oral BID Albertine Patricia, MD   2 tablet at 02/05/16 0858  . sodium chloride flush (NS) 0.9 % injection 3 mL  3 mL Intravenous Q12H Toy [name], MD   3 mL at 02/06/16 0854  . sodium chloride flush (NS) 0.9 % injection 3 mL  3 mL Intravenous PRN Toy [name], MD   3 mL at 01/31/16 2157  . spironolactone (ALDACTONE) tablet 25 mg  25 mg Oral BID Toy [name], MD   25 mg at 02/06/16 0853  . warfarin (COUMADIN) tablet 8 mg  8 mg Oral q1800 Albertine Patricia, MD   8 mg at  02/05/16 1720  . Warfarin - Pharmacist Dosing Inpatient   Does not apply q1800 Lavenia Atlas, [facility]        PE: General appearance: alert, cooperative, no distress and morbidly obese Neck: no carotid bruit and no JVD Lungs: clear to auscultation bilaterally, upper airway sounds, no wheezing or crackles Heart: RRR, no murmurs Extremities: no edema, chronic statis dermatitis bilaterally Skin: warm and dry Neurologic: CN2-12 grossly intact  BMET  Recent Labs  02/04/16 0722 02/05/16 0628 02/06/16 0649  NA 137 138 139  K 3.8 3.8 4.5  CL 95* 94* 96*  CO2 31 32 31  GLUCOSE 123* 129* 62*  BUN 30* 30* 33*  CREATININE 2.08* 2.18* 2.31*  CALCIUM 9.2 9.0 9.1   PT/INR  Recent Labs  02/04/16 0722 02/05/16 0628 02/06/16 0649  LABPROT 24.3* 27.3* 29.7*  INR 2.20* 2.57* 2.88*    Assessment/Plan  Active Problems:   Diabetes mellitus, insulin dependent (IDDM), uncontrolled (HCC)   Obstructive sleep apnea   Hypertension   Atrial fibrillation/flutter   Hypoxia   Acute on chronic combined systolic and diastolic CHF, NYHA class 2 (HCC)   Acute on chronic renal failure (HCC)   Acute on chronic respiratory failure (HCC)   Cellulitis   Acute on chronic respiratory failure with hypoxia (HCC)   Elevated troponin   Bradycardia   Typical atrial flutter (HCC)  1. Acute on chronic systolic and diastolic congestive heart failure: clinically improved. Good diuresis. -16.3L since admit. Appears to be at dry weight of self reported ~ 294lb as he is 301lbs today. Transitioned to home po lasix yesterday with continual rise in his creatinine to 2.31 today from 2.18 yesterday.  - decrease lasix to 40mg  BID, can resume home dose once creatine improves  2. Functioning pacemaker: implanted for symptomatic bradycardia  3. Chronic atrial fibrillation and atrial flutter: rate is controlled on Coreg. Coumadin for anticoagulation.   4. Stage stage III chronic kidney disease:  Creatinine  increasing w/ diuresis, will back off on lasix dose for now, f/u BMET at outpatient rehab.    LOS: 11 days      [name] MD 02/06/2016 9:26 AM The patient has been seen in conjunction with [name], MD. All aspects of care have been considered and discussed. The patient has been personally interviewed, examined, and all clinical data has been reviewed.   He has good diuresis.  Weight is down. Discussed the importance of daily weights under the same circumstances with concern if 3 pound weight gain in one day or greater than 5 pounds over time.  Creat  is slightly up compared to baseline.  Recommend continue OP diuretic regimen as prior to admission. Patient states that his outpatient regimen was furosemide 80 mg once per day. Would hold spironolactone until kidney function begins to improve.  Needs f/u with Dr. Liane Comber.

## 2016-02-06 NOTE — Progress Notes (Signed)
Spoke with wife, Schyler Mckeen who stated she wants to transport patient. [name] stated she will be off work around 330pm.

## 2016-02-06 NOTE — Progress Notes (Signed)
When CSW was asked to speak with patient about transportation to [employer] at 4:30pm, patient was on phone with wife. Patient told her he did not want to take an ambulance to [facility]. Wife then stated, "You aren't going to [place], you're going to [place]." CSW explained that patient and wife had told weekend two different CSWs that patient had chosen [facility]. Patient's wife denied this and said patient would be going to [place]. Patient refused to go to [facility] and admitted that he had "probably told you I wanted [facility], but I meant [place]." [facility] contacted [facility] admissions, but at that time their pharmacy had closed. [facility] has agreed to take patient on Tuesday when they can get patient a CPAP machine.   Percell Locus Markayla Reichart LCSWA [phone]

## 2016-02-06 NOTE — Care Management Important Message (Signed)
Important Message  Patient Details  Name: Kurt Cross MRN: [id number] Date of Birth: 01-19-1951   Medicare Important Message Given:  Yes    [name] May 02/06/2016, 12:27 PM

## 2016-02-07 ENCOUNTER — Encounter: Payer: Self-pay | Admitting: Nurse Practitioner

## 2016-02-07 ENCOUNTER — Non-Acute Institutional Stay (SKILLED_NURSING_FACILITY): Payer: Medicare Other | Admitting: Nurse Practitioner

## 2016-02-07 DIAGNOSIS — I5043 Acute on chronic combined systolic (congestive) and diastolic (congestive) heart failure: Secondary | ICD-10-CM | POA: Diagnosis not present

## 2016-02-07 DIAGNOSIS — N179 Acute kidney failure, unspecified: Secondary | ICD-10-CM

## 2016-02-07 DIAGNOSIS — I483 Typical atrial flutter: Secondary | ICD-10-CM

## 2016-02-07 DIAGNOSIS — L03116 Cellulitis of left lower limb: Secondary | ICD-10-CM | POA: Diagnosis not present

## 2016-02-07 DIAGNOSIS — E1010 Type 1 diabetes mellitus with ketoacidosis without coma: Secondary | ICD-10-CM

## 2016-02-07 DIAGNOSIS — R778 Other specified abnormalities of plasma proteins: Secondary | ICD-10-CM

## 2016-02-07 DIAGNOSIS — N189 Chronic kidney disease, unspecified: Secondary | ICD-10-CM

## 2016-02-07 DIAGNOSIS — R7989 Other specified abnormal findings of blood chemistry: Secondary | ICD-10-CM

## 2016-02-07 DIAGNOSIS — J9621 Acute and chronic respiratory failure with hypoxia: Secondary | ICD-10-CM | POA: Diagnosis not present

## 2016-02-07 DIAGNOSIS — R001 Bradycardia, unspecified: Secondary | ICD-10-CM

## 2016-02-07 DIAGNOSIS — K5900 Constipation, unspecified: Secondary | ICD-10-CM | POA: Diagnosis not present

## 2016-02-07 DIAGNOSIS — G4733 Obstructive sleep apnea (adult) (pediatric): Secondary | ICD-10-CM | POA: Diagnosis not present

## 2016-02-07 LAB — PROTIME-INR: Protime: 29.1 seconds — AB (ref 10.0–13.8)

## 2016-02-07 LAB — POCT INR: INR: 2.4 — AB (ref 0.9–1.1)

## 2016-02-07 NOTE — Assessment & Plan Note (Addendum)
-   Per cardiology: consider atrial flutter ablation down the road if LA size is not enlarged however given noncompliance, he will have to demonstrate that he will be compliant with anticoagulation therapy before we consider this. - Chads2vasc score of 4, on Warfarin Continue Coreg 25mg  bid

## 2016-02-07 NOTE — Assessment & Plan Note (Signed)
Stable, continue Senna S II bid.

## 2016-02-07 NOTE — Assessment & Plan Note (Signed)
-   has pacemaker

## 2016-02-07 NOTE — Progress Notes (Signed)
Patient ID: Kurt Cross, male   DOB: 1951-03-13, 65 y.o.   MRN: [id number]  Location:  [facility] Room Number: [id number] A Place of Service:  SNF (31) Provider: Lennie Odor Meloni Hinz NP  [name], MD  Patient Care Team: [name], MD as PCP - General (Internal Medicine) Charolette Forward, MD as Consulting Physician (Cardiology) [name], MD as Consulting Physician (Pulmonary Disease) Michael Boston, MD as Consulting Physician (General Surgery) Sherilyn Banker, MD as Consulting Physician (Surgery)  Extended Emergency Contact Information Primary Emergency Contact: Mejorado,Susan Address: [address]           [place], [place] [address] Johnnette Litter of [place] Phone: [phone] Mobile Phone: [phone] Relation: Spouse  Code Status:  DNR Goals of care: Advanced Directive information Advanced Directives 02/07/2016  Does patient have an advance directive? No  Would patient like information on creating an advanced directive? -     Chief Complaint  Patient presents with  . Hospitalization Follow-up    HPI: Pt is a 65 y.o. male seen today for a hospital f/u s/p admission, hospitalized 01/26/16 to 02/06/16 for Acute on chronic respiratory failure with hypoxia, Acute on chronic combined systolic Furosemide at 80mg  bid, Spironolactone 25mg  bid, due to creatinine elevation and diastolic CHF, Atrial flutter, heart rate is controlled. Coumadin started. Elevated troponin Likely due to demand ischemia   Past Medical History  Diagnosis Date  . Sleep apnea   . Diabetes mellitus, type 2 ([place])   . Hypertension   . Hyperlipidemia   . CHF (congestive heart failure), NYHA class II ([place])   . Hernia of abdominal cavity   . AAA (abdominal aortic aneurysm)/ 4.5 cm ascending per CT angio 08/01/13 08/12/2013  . 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 Constipation, chronic 08/03/2013  . Benign hypertension   . Hypokalemia     . OSA (obstructive sleep apnea)   . Neuropathy ([place])   . Hx of Mobitz type II block     s/p PPM Bi-Ven  . Atrial fibrillation [place])    Past Surgical History  Procedure Laterality Date  . Appendectomy    . Tonsillectomy and adenoidectomy    . Nasal septum surgery    . Bowel resection  1980s?    ?colectomy/ostomy - Dr Elesa Hacker  . Ostomy take down  1980s?    Dr Elesa Hacker  . Incisional hernia repair  1980s?    Dr. Lindon Romp - w mesh  . Knee surgery Right   . Incision and drainage abscess N/A 08/14/2013    Procedure: INCISION AND DRAINAGE ABSCESS, mesh ;  Surgeon: [name], MD;  Location: [facility] ORS;  Service: General;  Laterality: N/A;  . Left heart catheterization with coronary angiogram N/A 03/17/2014    Procedure: LEFT HEART CATHETERIZATION WITH CORONARY ANGIOGRAM;  Surgeon: [name], MD;  Location: [facility] CATH LAB;  Service: Cardiovascular;  Laterality: N/A;    No Known Allergies    Medication List       This list is accurate as of: 02/07/16  3:57 PM.  Always use your most recent med list.               acidophilus Caps capsule  Take 2 capsules by mouth daily.     albuterol (2.5 MG/3ML) 0.083% nebulizer solution  Commonly known as:  PROVENTIL  Take 3 mLs (2.5 mg total) by nebulization every 4 (four) hours as needed  for wheezing or shortness of breath.     atorvastatin 40 MG tablet  Commonly known as:  LIPITOR  Take 40 mg by mouth at bedtime.     carvedilol 25 MG tablet  Commonly known as:  COREG  Take 25 mg by mouth 2 (two) times daily.     furosemide 80 MG tablet  Commonly known as:  LASIX  Take 80 mg by mouth 2 (two) times daily.     HUMALOG 100 UNIT/ML cartridge  Generic drug:  insulin lispro  Inject 6 Units into the skin 3 (three) times daily with meals. If CBG is >150     insulin NPH Human 100 UNIT/ML injection  Commonly known as:  HUMULIN N,NOVOLIN N  Inject 0.28 mLs (28 Units total) into the skin at bedtime.     isosorbide-hydrALAZINE 20-37.5 MG  tablet  Commonly known as:  BIDIL  Take 1 tablet by mouth 3 (three) times daily.     oxyCODONE-acetaminophen 5-325 MG tablet  Commonly known as:  PERCOCET/ROXICET  Take 1 tablet by mouth every 6 (six) hours as needed for moderate pain.     senna-docusate 8.6-50 MG tablet  Commonly known as:  Senokot-S  Take 2 tablets by mouth 2 (two) times daily.     spironolactone 25 MG tablet  Commonly known as:  ALDACTONE  Take 25 mg by mouth 2 (two) times daily.     warfarin 4 MG tablet  Commonly known as:  COUMADIN  Take 8 mg by mouth daily at 6 PM.        Review of Systems  Constitutional: Negative for fever, chills, diaphoresis, activity change, appetite change, fatigue and unexpected weight change.  HENT: Negative for congestion, dental problem, drooling, ear discharge, ear pain, facial swelling, hearing loss, mouth sores, nosebleeds, trouble swallowing and voice change.   Eyes: Negative for photophobia, discharge, redness and itching.  Respiratory: Negative for apnea, cough, choking, chest tightness, shortness of breath and wheezing.   Cardiovascular: Positive for leg swelling. Negative for chest pain and palpitations.       Chronic BLE edema, RLE 1+, LLE 2+  Gastrointestinal: Negative for nausea, vomiting, abdominal pain, diarrhea, constipation, blood in stool, abdominal distention, anal bleeding and rectal pain.  Endocrine: Negative for cold intolerance and heat intolerance.  Genitourinary: Negative for dysuria, urgency, frequency, flank pain, decreased urine volume, enuresis and difficulty urinating.  Musculoskeletal: Positive for arthralgias and gait problem. Negative for myalgias, back pain, joint swelling, neck pain and neck stiffness.  Skin: Negative for color change, pallor, rash and wound.       BLr chronic venous dermatitis  Neurological: Negative for dizziness, tremors, seizures, syncope, facial asymmetry, speech difficulty, weakness, light-headedness, numbness and headaches.    Hematological: Negative for adenopathy. Does not bruise/bleed easily.  Psychiatric/Behavioral: Negative for suicidal ideas, hallucinations, behavioral problems, confusion, sleep disturbance, self-injury, dysphoric mood, decreased concentration and agitation. The patient is not nervous/anxious and is not hyperactive.     Immunization History  Administered Date(s) Administered  . Influenza Split 06/29/2012  . Influenza,inj,Quad PF,36+ Mos 08/02/2013  . Pneumococcal Polysaccharide-23 06/29/2012   Pertinent  Health Maintenance Due  Topic Date Due  . FOOT EXAM  05/08/1963  . OPHTHALMOLOGY EXAM  05/08/1963  . URINE MICROALBUMIN  05/08/1963  . COLONOSCOPY  05/08/2003  . INFLUENZA VACCINE  05/07/2016  . HEMOGLOBIN A1C  07/28/2016   No flowsheet data found. Functional Status Survey:    Filed Vitals:   02/07/16 1305  BP: 135/78  Pulse: 69  Temp: 98.8 F (37.1 C)  TempSrc: Oral  Resp: 17  Height: 5\' 11"  (1.803 m)  Weight: 301 lb 6.4 oz (136.714 kg)   Body mass index is 42.06 kg/(m^2). Physical Exam  Skin:  Pacemaker, abd surgical scars    Labs reviewed:  Recent Labs  02/04/16 0722 02/05/16 0628 02/06/16 0649  NA 137 138 139  K 3.8 3.8 4.5  CL 95* 94* 96*  CO2 31 32 31  GLUCOSE 123* 129* 62*  BUN 30* 30* 33*  CREATININE 2.08* 2.18* 2.31*  CALCIUM 9.2 9.0 9.1    Recent Labs  01/26/16 2106  AST 26  ALT 16*  ALKPHOS 28*  BILITOT 1.2  PROT 7.2  ALBUMIN 2.7*    Recent Labs  01/26/16 2106  01/31/16 0404 02/01/16 0625 02/02/16 0619  WBC 12.3*  < > 14.7* 16.0* 12.9*  NEUTROABS 10.2*  --   --   --   --   HGB 16.9  < > 14.5 14.3 14.3  HCT 49.8  < > 43.8 42.6 43.4  MCV 99.4  < > 100.9* 100.7* 99.5  PLT 147*  < > 182 215 231  < > = values in this interval not displayed. Lab Results  Component Value Date   TSH 1.088 06/28/2012   Lab Results  Component Value Date   HGBA1C 7.4* 01/27/2016   Lab Results  Component Value Date   CHOL  06/15/2007    126         ATP III CLASSIFICATION:  <200     mg/dL   Desirable  200-239  mg/dL   Borderline High  >=240    mg/dL   High   HDL 24* 06/15/2007   LDLCALC  06/15/2007    83        Total Cholesterol/HDL:CHD Risk Coronary Heart Disease Risk Table                     Men   Women  1/2 Average Risk   3.4   3.3   TRIG 82 11/15/2013   CHOLHDL 5.3 06/15/2007    Significant Diagnostic Results in last 30 days:  Dg Chest Port 1 View  01/26/2016  CLINICAL DATA:  severe short of breath for 3 days EXAM: PORTABLE CHEST 1 VIEW COMPARISON:  None. FINDINGS: LEFT-sided pacemaker overlies stable enlarged cardiac silhouette. There is central venous congestion. No focal infiltrate. No pneumothorax. IMPRESSION: Cardiomegaly and central venous congestion. Electronically Signed   By: Suzy Bouchard M.D.   On: 01/26/2016 21:59    Assessment/Plan  Acute on chronic respiratory failure (HCC) multifactoral due to acute on chronic combined systolic and diastolic CHF, baseline morbid obesity, obstructive sleep apnea. - Improving with diuresis - Continue with oxygen as needed  Acute on chronic combined systolic and diastolic CHF, NYHA class 2 (HCC) - 2-D echo EF 30-35% - Managed with IV diuresis during hospital stay IV diuresis, managed by cardiology,clinically improved. [place] PO Lasix, Stressed daily weights and low sodium diet - Patient home dose Lasix 80 mg by mouth twice a day, creatinine today is 2.3, up from 2.18 yesterday, so the dose has been decreased, will need repeat BMP in 3 days, if renal function back to baseline 1.8-1.9, continue Furosemide 80mg  bid, Spironolactone 25mg  bid - no ACEI or ARB given her renal failure - Started on Imdur/hydralazine for after load reduction - continue with Coreg  - To neutral hold Aldactone and discharge, decision about resumption can be made by cardiology on  follow-up if renal function remained stable   Diabetes mellitus, insulin dependent (IDDM), uncontrolled  ([place]) -Continue with NPH and Humalog 6 u with meals for CBG >150   Typical atrial flutter ([place]) - Per cardiology: consider atrial flutter ablation down the road if LA size is not enlarged however given noncompliance, he will have to demonstrate that he will be compliant with anticoagulation therapy before we consider this. - Chads2vasc score of 4, on Warfarin Continue Coreg 25mg  bid    Obstructive sleep apnea Encourage life style changes, noncompliant with C Pap    Bradycardia - has pacemaker  Cellulitis - Left lower leg cellulitis, significant swelling, but venous Doppler negative for DVT - Initially on Rocephin,transitioned to by mouth Keflex, finish another 3 days as an outpatient   Acute on chronic renal failure (HCC) Acute on chronic renal failure stage 3 - Baseline renal function 1.8-1.9, currently 2.3 , continue Lasix 80mg  oral bid, Spironolactone 25mg  bid, repeat BMP in 3 days.     Elevated troponin Continue Bidil 20/37.5mg  tid  Constipation Stable, continue Senna S II bid.     Family/ staff Communication: SNF for care needs.   Labs/tests ordered: CBC BMP pending.

## 2016-02-07 NOTE — Assessment & Plan Note (Signed)
-  Continue with NPH and Humalog 6 u with meals for CBG >150

## 2016-02-07 NOTE — Assessment & Plan Note (Signed)
Continue Bidil 20/37.5mg  tid

## 2016-02-07 NOTE — Assessment & Plan Note (Signed)
Encourage life style changes, noncompliant with C Pap

## 2016-02-07 NOTE — Assessment & Plan Note (Addendum)
Acute on chronic renal failure stage 3 - Baseline renal function 1.8-1.9, currently 2.3 , continue Lasix 80mg  oral bid, Spironolactone 25mg  bid, repeat BMP in 3 days.

## 2016-02-07 NOTE — Assessment & Plan Note (Signed)
-   Left lower leg cellulitis, significant swelling, but venous Doppler negative for DVT - Initially on Rocephin,transitioned to by mouth Keflex, finish another 3 days as an outpatient

## 2016-02-07 NOTE — Assessment & Plan Note (Addendum)
-   2-D echo EF 30-35% - Managed with IV diuresis during hospital stay IV diuresis, managed by cardiology,clinically improved. [place] PO Lasix, Stressed daily weights and low sodium diet - Patient home dose Lasix 80 mg by mouth twice a day, creatinine today is 2.3, up from 2.18 yesterday, so the dose has been decreased, will need repeat BMP in 3 days, if renal function back to baseline 1.8-1.9, continue Furosemide 80mg  bid, Spironolactone 25mg  bid - no ACEI or ARB given her renal failure - Started on Imdur/hydralazine for after load reduction - continue with Coreg  - To neutral hold Aldactone and discharge, decision about resumption can be made by cardiology on follow-up if renal function remained stable

## 2016-02-07 NOTE — Assessment & Plan Note (Signed)
multifactoral due to acute on chronic combined systolic and diastolic CHF, baseline morbid obesity, obstructive sleep apnea. - Improving with diuresis - Continue with oxygen as needed

## 2016-02-08 ENCOUNTER — Non-Acute Institutional Stay (SKILLED_NURSING_FACILITY): Payer: Medicare Other | Admitting: Internal Medicine

## 2016-02-08 ENCOUNTER — Encounter: Payer: Self-pay | Admitting: Internal Medicine

## 2016-02-08 DIAGNOSIS — I714 Abdominal aortic aneurysm, without rupture, unspecified: Secondary | ICD-10-CM

## 2016-02-08 DIAGNOSIS — R5381 Other malaise: Secondary | ICD-10-CM | POA: Diagnosis not present

## 2016-02-08 DIAGNOSIS — I5042 Chronic combined systolic (congestive) and diastolic (congestive) heart failure: Secondary | ICD-10-CM | POA: Diagnosis not present

## 2016-02-08 DIAGNOSIS — J9611 Chronic respiratory failure with hypoxia: Secondary | ICD-10-CM

## 2016-02-08 DIAGNOSIS — G4733 Obstructive sleep apnea (adult) (pediatric): Secondary | ICD-10-CM | POA: Diagnosis not present

## 2016-02-08 DIAGNOSIS — L03116 Cellulitis of left lower limb: Secondary | ICD-10-CM | POA: Diagnosis not present

## 2016-02-08 DIAGNOSIS — I4819 Other persistent atrial fibrillation: Secondary | ICD-10-CM

## 2016-02-08 DIAGNOSIS — I481 Persistent atrial fibrillation: Secondary | ICD-10-CM

## 2016-02-08 DIAGNOSIS — I10 Essential (primary) hypertension: Secondary | ICD-10-CM

## 2016-02-08 DIAGNOSIS — R0902 Hypoxemia: Secondary | ICD-10-CM

## 2016-02-08 DIAGNOSIS — E1010 Type 1 diabetes mellitus with ketoacidosis without coma: Secondary | ICD-10-CM | POA: Diagnosis not present

## 2016-02-08 NOTE — Progress Notes (Signed)
Patient ID: Kurt Cross, male   DOB: [birth year], 65 y.o.   MRN: [id number]  Provider:   Location:  [place] [facility] [place] Room Number: [id number] Place of Service:  SNF (31)  PCP: Estill Dooms, MD Patient Care Team: Estill Dooms, MD as PCP - General (Internal Medicine) Charolette Forward, MD as Consulting Physician (Cardiology) [name], MD as Consulting Physician (Pulmonary Disease) Michael Boston, MD as Consulting Physician (General Surgery) Sherilyn Banker, MD as Consulting Physician (Surgery)  Extended Emergency Contact Information Primary Emergency Contact: Crymes,Susan Address: [address]           [place], [place] [address] Johnnette Litter of [place] Phone: [phone] Mobile Phone: [phone] Relation: Spouse  Code Status: full Goals of Care: Advanced Directive information Advanced Directives 02/08/2016  Does patient have an advance directive? No  Would patient like information on creating an advanced directive? -      Chief Complaint  Patient presents with  . New Admit To SNF    following hospitalization 01/09/16 to 02/06/16 for cough    HPI: Patient is a 65 y.o. male seen today for admission to [facility] skilled nursing facility for short-term rehabilitation following hospitalization from 01/26/2016 through 02/06/2016.  Patient is morbidly obese and has chronic respiratory failure. He presented to the emergency room with dyspnea and sputum. He had pneumonia. Although he should have been using home oxygen, he was not.  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 Patient has acute on chronic renal failure during his hospitalization. Review of all data suggests that his creatinine runs little over 2.  Patient had atrial flutter/fib and was started on warfarin during the course of his  hospitalization. He was treated with Eliquis in the past.   Cellulitis of the left leg was treated initially with Rocephin to Keflex.  Patient has had a pacemaker placed secondary to chronic bradycardia.  Patient has been disabled since his late 96s. This is related to multiple abdominal wall hernias, infected mesh graft, and perforation resulting in a short gut syndrome.  Past Medical History  Diagnosis Date  . Sleep apnea   . Diabetes mellitus, type 2 ([place])   . Hypertension   . Hyperlipidemia   . CHF (congestive heart failure), NYHA class II ([place])   . Hernia of abdominal cavity   . AAA (abdominal aortic aneurysm)/ 4.5 cm ascending per CT angio 08/01/13 08/12/2013  . 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 Constipation, chronic 08/03/2013  . Benign hypertension   . Hypokalemia   . OSA (obstructive sleep apnea)   . Neuropathy ([place])   . Hx of Mobitz type II block     s/p PPM Bi-Ven  . Atrial fibrillation ([place])   . Cough   . Cellulitis   . Kidney failure   . Bradycardia   . Hypoxemia    Past Surgical History  Procedure Laterality Date  . Appendectomy    . Tonsillectomy and adenoidectomy    . Nasal septum surgery    . Bowel resection  1980s?    ?colectomy/ostomy - Dr Elesa Hacker  . Ostomy take down  1980s?    Dr Elesa Hacker  . Incisional hernia repair  1980s?    Dr. Lindon Romp - w mesh  . Knee surgery Right   . Incision and  drainage abscess N/A 08/14/2013    Procedure: INCISION AND DRAINAGE ABSCESS, mesh ;  Surgeon: [name], MD;  Location: [facility];  Service: General;  Laterality: N/A;  . Left heart catheterization with coronary angiogram N/A 03/17/2014    Procedure: LEFT HEART CATHETERIZATION WITH CORONARY ANGIOGRAM;  Surgeon: [name], MD;  Location: [facility] CATH LAB;  Service: Cardiovascular;  Laterality: N/A;    reports that he quit smoking about 3 years ago. His smoking use included Cigarettes. He has a 40  pack-year smoking history. He has never used smokeless tobacco. He reports that he drinks alcohol. He reports that he does not use illicit drugs. Social History   Social History  . Marital Status: Married    Spouse Name: N/A  . Number of Children: N/A  . Years of Education: N/A   Occupational History  . Not on file.   Social History Main Topics  . Smoking status: Former Smoker -- 1.00 packs/day for 40 years    Types: Cigarettes    Quit date: 08/21/2012  . Smokeless tobacco: Never Used  . Alcohol Use: 0.0 oz/week    0 Standard drinks or equivalent per week     Comment: occasional  . Drug Use: No  . Sexual Activity: Not on file   Other Topics Concern  . Not on file   Social History Narrative   Lives at [facility] since 02/06/16   Married -[name]   Former smoker -stopped 2013   No Advance Directives     Family History  Problem Relation Age of Onset  . Diabetes Mother   . Alcoholism Father   . Heart Problems Maternal Grandmother     Health Maintenance  Topic Date Due  . Hepatitis C Screening  March 11, 1951  . FOOT EXAM  05/08/1963  . OPHTHALMOLOGY EXAM  05/08/1963  . URINE MICROALBUMIN  05/08/1963  . HIV Screening  05/07/1968  . TETANUS/TDAP  05/07/1972  . COLONOSCOPY  05/08/2003  . ZOSTAVAX  05/07/2013  . INFLUENZA VACCINE  05/07/2016  . HEMOGLOBIN A1C  07/28/2016  . PNEUMOCOCCAL POLYSACCHARIDE VACCINE (2) 06/29/2017    No Known Allergies    Medication List       This list is accurate as of: 02/08/16  4:10 PM.  Always use your most recent med list.               acidophilus Caps capsule  Take 2 capsules by mouth daily.     albuterol (2.5 MG/3ML) 0.083% nebulizer solution  Commonly known as:  PROVENTIL  Take 3 mLs (2.5 mg total) by nebulization every 4 (four) hours as needed for wheezing or shortness of breath.     atorvastatin 40 MG tablet  Commonly known as:  LIPITOR  Take 40 mg by mouth at bedtime.     carvedilol 25 MG tablet  Commonly known as:   COREG  Take 25 mg by mouth 2 (two) times daily.     furosemide 80 MG tablet  Commonly known as:  LASIX  Take 80 mg by mouth 2 (two) times daily.     HUMALOG 100 UNIT/ML cartridge  Generic drug:  insulin lispro  Inject into the skin. 6 units with meals for CBG > 150     HUMALOG KWIKPEN 100 UNIT/ML KiwkPen  Generic drug:  insulin lispro  Sliding scale 150-200 5 units; 201-300 8 units, 301-350 10units, > 12 units.     insulin NPH Human 100 UNIT/ML injection  Commonly known as:  HUMULIN N,NOVOLIN  N  Inject 0.28 mLs (28 Units total) into the skin at bedtime.     isosorbide-hydrALAZINE 20-37.5 MG tablet  Commonly known as:  BIDIL  Take 1 tablet by mouth 3 (three) times daily.     oxyCODONE-acetaminophen 5-325 MG tablet  Commonly known as:  PERCOCET/ROXICET  Take 1 tablet by mouth every 6 (six) hours as needed for moderate pain.     senna-docusate 8.6-50 MG tablet  Commonly known as:  Senokot-S  Take 2 tablets by mouth 2 (two) times daily.     spironolactone 25 MG tablet  Commonly known as:  ALDACTONE  Take 25 mg by mouth 2 (two) times daily.     warfarin 4 MG tablet  Commonly known as:  COUMADIN  Take 8 mg by mouth daily at 6 PM.        Review of Systems  Constitutional: Negative for fever, chills, diaphoresis, activity change, appetite change, fatigue and unexpected weight change.       Morbid obesity  HENT: Negative for congestion, dental problem, drooling, ear discharge, ear pain, facial swelling, hearing loss, mouth sores, nosebleeds, trouble swallowing and voice change.        Missing many teeth  Eyes: Negative for photophobia, discharge, redness and itching.  Respiratory: Negative for apnea, cough, choking, chest tightness, shortness of breath and wheezing.        History of OSA, chronic respiratory failure, dyspnea, and poor compliance with use of home oxygen.  Cardiovascular: Positive for leg swelling. Negative for chest pain and palpitations.       Cardiomyopathy  with ejection fraction 40% Chronic BLE edema, RLE 1+, LLE 2+  Gastrointestinal: Positive for constipation. Negative for nausea, vomiting, abdominal pain, diarrhea, blood in stool, abdominal distention, anal bleeding and rectal pain.       Long history of multiple abdominal surgeries related to ventral hernias and infected mesh. At one point he had a perforation of the bowel resulting in a short gut syndrome.  Endocrine: Negative for cold intolerance and heat intolerance.       Diabetic  Genitourinary: Negative for dysuria, urgency, frequency, flank pain, decreased urine volume, enuresis and difficulty urinating.  Musculoskeletal: Positive for arthralgias and gait problem. Negative for myalgias, back pain, joint swelling, neck pain and neck stiffness.       Generalized weakness  Skin: Negative for color change, pallor, rash and wound.       Bilateral chronic venous dermatitis. Skin peeling on left leg secondary to recent cellulitis.  Neurological: Negative for dizziness, tremors, seizures, syncope, facial asymmetry, speech difficulty, weakness, light-headedness, numbness and headaches.  Hematological: Negative for adenopathy. Does not bruise/bleed easily.  Psychiatric/Behavioral: Positive for dysphoric mood. Negative for suicidal ideas, hallucinations, behavioral problems, confusion, sleep disturbance, self-injury, decreased concentration and agitation. The patient is not nervous/anxious and is not hyperactive.     Filed Vitals:   02/08/16 1634  BP: 148/88  Pulse: 70  Resp: 18  Height: 5\' 11"  (1.803 m)  Weight: 301 lb (136.533 kg)  SpO2: 95%   Body mass index is 42 kg/(m^2). Physical Exam  Constitutional: He is oriented to person, place, and time. No distress.  Morbid obesity  HENT:  Missing teeth  Eyes: Conjunctivae and EOM are normal. Pupils are equal, round, and reactive to light. No scleral icterus.  Neck: No JVD present. No tracheal deviation present. No thyromegaly present.    Cardiovascular: Normal rate, regular rhythm and normal heart sounds.  Exam reveals no gallop and no friction rub.  No murmur heard. Pulmonary/Chest:  Pacemaker left upper chest wall  Abdominal:  Multiple abdominal scars  Musculoskeletal: He exhibits edema (2+ bipedal).  Generalized weakness  Lymphadenopathy:    He has no cervical adenopathy.  Neurological: He is alert and oriented to person, place, and time. No cranial nerve deficit. Coordination normal.  Skin: Skin is warm and dry. He is not diaphoretic. No erythema.  abd surgical scars. Peeling skin on left leg secondary to recent cellulitis  Psychiatric:  Depressed, slightly angry affect.    Labs reviewed: Basic Metabolic Panel:  Recent Labs  02/04/16 0722 02/05/16 0628 02/06/16 0649  NA 137 138 139  K 3.8 3.8 4.5  CL 95* 94* 96*  CO2 31 32 31  GLUCOSE 123* 129* 62*  BUN 30* 30* 33*  CREATININE 2.08* 2.18* 2.31*  CALCIUM 9.2 9.0 9.1   Liver Function Tests:  Recent Labs  01/26/16 2106  AST 26  ALT 16*  ALKPHOS 28*  BILITOT 1.2  PROT 7.2  ALBUMIN 2.7*   No results for input(s): LIPASE, AMYLASE in the last 8760 hours. No results for input(s): AMMONIA in the last 8760 hours. CBC:  Recent Labs  01/26/16 2106  01/31/16 0404 02/01/16 0625 02/02/16 0619  WBC 12.3*  < > 14.7* 16.0* 12.9*  NEUTROABS 10.2*  --   --   --   --   HGB 16.9  < > 14.5 14.3 14.3  HCT 49.8  < > 43.8 42.6 43.4  MCV 99.4  < > 100.9* 100.7* 99.5  PLT 147*  < > 182 215 231  < > = values in this interval not displayed. Cardiac Enzymes:  Recent Labs  01/27/16 0241 01/27/16 0714 01/27/16 1159  TROPONINI 0.15* 0.14* 0.11*   BNP: Invalid input(s): POCBNP Lab Results  Component Value Date   HGBA1C 7.4* 01/27/2016   Lab Results  Component Value Date   TSH 1.088 06/28/2012   Lab Results  Component Value Date   VITAMINB12 273 01/02/2010   Lab Results  Component Value Date   FOLATE  01/02/2010    6.3 (NOTE)  Reference  Ranges        Deficient:       0.4 - 3.3 ng/mL        Indeterminate:   3.4 - 5.4 ng/mL        Normal:              > 5.4 ng/mL   No results found for: IRON, TIBC, FERRITIN  Imaging and Procedures obtained prior to SNF admission: Dg Chest Port 1 View  01/26/2016  CLINICAL DATA:  severe short of breath for 3 days EXAM: PORTABLE CHEST 1 VIEW COMPARISON:  None. FINDINGS: LEFT-sided pacemaker overlies stable enlarged cardiac silhouette. There is central venous congestion. No focal infiltrate. No pneumothorax. IMPRESSION: Cardiomegaly and central venous congestion. Electronically Signed   By: Suzy Bouchard M.D.   On: 01/26/2016 21:59    Assessment/Plan 1. Debility Generalized weakness. Patient engaged in physical therapy and occupational therapy. He needs to gain more strength before he returns home. Patient lives at home with his wife and children. He needs to be more self-sufficient prior to return to home.  2. Chronic respiratory failure with hypoxia [facility]) Patient cooperative and polite. He appears to understand the need to continue with his oxygen.  3. Chronic combined systolic and diastolic CHF (congestive heart failure) (HCC) Stable on current medications  4. Uncontrolled type 1 diabetes mellitus with ketoacidosis without coma ([place]) Stable on  current medication  5. Hypoxia Oxygen 2 L/m  6. Obesity, Class III, BMI 40-49.9 (morbid obesity) (HCC) Dietary compliance in the future stress  7. Obstructive sleep apnea Patient previously had home CPAP, but the machiine broke down and no longer uses it.  8. Essential hypertension Controlled  9. Cellulitis of left lower extremity Resolving  10. Abdominal aortic aneurysm (AAA) without rupture (HCC) Stable  11. Persistent atrial fibrillation (HCC) Currently warfarin. He may be a candidate for Eliquis in the future.

## 2016-02-10 ENCOUNTER — Encounter: Payer: Self-pay | Admitting: Internal Medicine

## 2016-02-10 DIAGNOSIS — R5381 Other malaise: Secondary | ICD-10-CM | POA: Insufficient documentation

## 2016-02-10 DIAGNOSIS — J9610 Chronic respiratory failure, unspecified whether with hypoxia or hypercapnia: Secondary | ICD-10-CM | POA: Insufficient documentation

## 2016-02-10 HISTORY — DX: Other malaise: R53.81

## 2016-02-10 HISTORY — DX: Chronic respiratory failure, unspecified whether with hypoxia or hypercapnia: J96.10

## 2016-02-12 ENCOUNTER — Non-Acute Institutional Stay (SKILLED_NURSING_FACILITY): Payer: Medicare Other | Admitting: Internal Medicine

## 2016-02-12 ENCOUNTER — Encounter: Payer: Self-pay | Admitting: Internal Medicine

## 2016-02-12 DIAGNOSIS — M79604 Pain in right leg: Secondary | ICD-10-CM

## 2016-02-12 NOTE — Progress Notes (Signed)
Patient ID: Kurt Cross, male   DOB: 02-18-1951, 65 y.o.   MRN: [id number]  Location:  [place] Room Number: [id number] Place of Service:  SNF (31) Provider:  Estill Dooms, MD  Patient Care Team: Estill Dooms, MD as PCP - General (Internal Medicine) Charolette Forward, MD as Consulting Physician (Cardiology) [name], MD as Consulting Physician (Pulmonary Disease) Michael Boston, MD as Consulting Physician (General Surgery) Sherilyn Banker, MD as Consulting Physician (Surgery)  Extended Emergency Contact Information Primary Emergency Contact: Pfarr,Susan Address: [address]           [place], [place] [address] Johnnette Litter of [place] Phone: [phone] Mobile Phone: [phone] Relation: Spouse  Code Status:  full Goals of care: Advanced Directive information Advanced Directives 02/12/2016  Does patient have an advance directive? No  Would patient like information on creating an advanced directive? -     Chief Complaint  Patient presents with  . Acute Visit    pain medication    HPI:  Pt is a 65 y.o. male seen today for an acute visit for Evaluation of intractable pain. Patient has had difficulty with pain down the right leg. He is having some problems, and his opinion, and getting his medications for pain and timely basis. He would like something stronger for pain. Currently he is taking Percocet 5/25. We will start oxycodone/APAP 10/325 one or pain.   Past Medical History  Diagnosis Date  . Sleep apnea   . Diabetes mellitus, type 2 ([place])   . Hypertension   . Hyperlipidemia   . CHF (congestive heart failure), NYHA class II ([place])   . Hernia of abdominal cavity   . AAA (abdominal aortic aneurysm)/ 4.5 cm ascending per CT angio 08/01/13 08/12/2013  . 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 Constipation, chronic 08/03/2013  . Benign hypertension   . Hypokalemia   . OSA  (obstructive sleep apnea)   . Neuropathy ([place])   . Hx of Mobitz type II block     s/p PPM Bi-Ven  . Atrial fibrillation ([place])   . Cough   . Cellulitis   . Kidney failure   . Bradycardia   . Hypoxemia   . Debility 02/10/2016  . Chronic respiratory failure ([place]) 02/10/2016   Past Surgical History  Procedure Laterality Date  . Appendectomy    . Tonsillectomy and adenoidectomy    . Nasal septum surgery    . Bowel resection  1980s?    ?colectomy/ostomy - Dr Elesa Hacker  . Ostomy take down  1980s?    Dr Elesa Hacker  . Incisional hernia repair  1980s?    Dr. Lindon Romp - w mesh  . Knee surgery Right   . Incision and drainage abscess N/A 08/14/2013    Procedure: INCISION AND DRAINAGE ABSCESS, mesh ;  Surgeon: [name], MD;  Location: [facility]S;  Service: General;  Laterality: N/A;  . Left heart catheterization with coronary angiogram N/A 03/17/2014    Procedure: LEFT HEART CATHETERIZATION WITH CORONARY ANGIOGRAM;  Surgeon: [name], MD;  Location: [facility] CATH LAB;  Service: Cardiovascular;  Laterality: N/A;    No Known Allergies    Medication List       This list is accurate as of: 02/12/16  1:43 PM.  Always use your most recent med list.               acidophilus  Caps capsule  Take 2 capsules by mouth daily.     albuterol (2.5 MG/3ML) 0.083% nebulizer solution  Commonly known as:  PROVENTIL  Take 3 mLs (2.5 mg total) by nebulization every 4 (four) hours as needed for wheezing or shortness of breath.     carvedilol 25 MG tablet  Commonly known as:  COREG  Take 25 mg by mouth 2 (two) times daily.     furosemide 80 MG tablet  Commonly known as:  LASIX  Take 80 mg by mouth 2 (two) times daily.     HUMALOG 100 UNIT/ML cartridge  Generic drug:  insulin lispro  Inject into the skin. 6 units with meals for CBG > 150     HUMALOG KWIKPEN 100 UNIT/ML KiwkPen  Generic drug:  insulin lispro  Sliding scale 150-200 5 units; 201-300 8 units, 301-350 10units, > 12 units.      isosorbide-hydrALAZINE 20-37.5 MG tablet  Commonly known as:  BIDIL  Take 1 tablet by mouth 3 (three) times daily.     oxyCODONE-acetaminophen 5-325 MG tablet  Commonly known as:  PERCOCET/ROXICET  Take 1 tablet by mouth every 6 (six) hours as needed for moderate pain.     rosuvastatin 10 MG tablet  Commonly known as:  CRESTOR  Take 10 mg by mouth daily.     senna-docusate 8.6-50 MG tablet  Commonly known as:  Senokot-S  Take 2 tablets by mouth 2 (two) times daily.     spironolactone 25 MG tablet  Commonly known as:  ALDACTONE  Take 25 mg by mouth 2 (two) times daily.     warfarin 4 MG tablet  Commonly known as:  COUMADIN  Take 8 mg by mouth daily at 6 PM.        Review of Systems  Constitutional: Negative for fever, chills, diaphoresis, activity change, appetite change, fatigue and unexpected weight change.       Morbid obesity  HENT: Negative for congestion, dental problem, drooling, ear discharge, ear pain, facial swelling, hearing loss, mouth sores, nosebleeds, trouble swallowing and voice change.        Missing many teeth  Eyes: Negative for photophobia, discharge, redness and itching.  Respiratory: Negative for apnea, cough, choking, chest tightness, shortness of breath and wheezing.        History of OSA, chronic respiratory failure, dyspnea, and poor compliance with use of home oxygen.  Cardiovascular: Positive for leg swelling. Negative for chest pain and palpitations.       Cardiomyopathy with ejection fraction 40% Chronic BLE edema, RLE 1+, LLE 2+  Gastrointestinal: Positive for constipation. Negative for nausea, vomiting, abdominal pain, diarrhea, blood in stool, abdominal distention, anal bleeding and rectal pain.       Long history of multiple abdominal surgeries related to ventral hernias and infected mesh. At one point he had a perforation of the bowel resulting in a short gut syndrome.  Endocrine: Negative for cold intolerance and heat intolerance.        Diabetic  Genitourinary: Negative for dysuria, urgency, frequency, flank pain, decreased urine volume, enuresis and difficulty urinating.  Musculoskeletal: Positive for arthralgias and gait problem. Negative for myalgias, back pain, joint swelling, neck pain and neck stiffness.       Generalized weakness  Skin: Negative for color change, pallor, rash and wound.       Bilateral chronic venous dermatitis. Skin peeling on left leg secondary to recent cellulitis.  Neurological: Negative for dizziness, tremors, seizures, syncope, facial asymmetry, speech difficulty, weakness,  light-headedness, numbness and headaches.  Hematological: Negative for adenopathy. Does not bruise/bleed easily.  Psychiatric/Behavioral: Positive for dysphoric mood. Negative for suicidal ideas, hallucinations, behavioral problems, confusion, sleep disturbance, self-injury, decreased concentration and agitation. The patient is not nervous/anxious and is not hyperactive.     Immunization History  Administered Date(s) Administered  . Influenza Split 06/29/2012  . Influenza,inj,Quad PF,36+ Mos 08/02/2013  . Pneumococcal Polysaccharide-23 06/29/2012   Pertinent  Health Maintenance Due  Topic Date Due  . FOOT EXAM  05/08/1963  . OPHTHALMOLOGY EXAM  05/08/1963  . URINE MICROALBUMIN  05/08/1963  . COLONOSCOPY  05/08/2003  . INFLUENZA VACCINE  05/07/2016  . HEMOGLOBIN A1C  07/28/2016   No flowsheet data found. Functional Status Survey:    Filed Vitals:   02/12/16 1331  BP: 146/76  Pulse: 70  Temp: 98.7 F (37.1 C)  Resp: 18  Height: 5\' 11"  (1.803 m)  Weight: 306 lb (138.801 kg)  SpO2: 95%   Body mass index is 42.7 kg/(m^2). Physical Exam  Constitutional: He is oriented to person, place, and time. No distress.  Morbid obesity  HENT:  Missing teeth  Eyes: Conjunctivae and EOM are normal. Pupils are equal, round, and reactive to light. No scleral icterus.  Neck: No JVD present. No tracheal deviation present. No  thyromegaly present.  Cardiovascular: Normal rate, regular rhythm and normal heart sounds.  Exam reveals no gallop and no friction rub.   No murmur heard. Pulmonary/Chest:  Pacemaker left upper chest wall  Abdominal:  Multiple abdominal scars  Musculoskeletal: He exhibits edema (2+ bipedal).  Generalized weakness  Lymphadenopathy:    He has no cervical adenopathy.  Neurological: He is alert and oriented to person, place, and time. No cranial nerve deficit. Coordination normal.  Skin: Skin is warm and dry. He is not diaphoretic. No erythema.  abd surgical scars. Peeling skin on left leg secondary to recent cellulitis  Psychiatric:  Depressed, slightly angry affect.    Labs reviewed:  Recent Labs  02/04/16 0722 02/05/16 0628 02/06/16 0649  NA 137 138 139  K 3.8 3.8 4.5  CL 95* 94* 96*  CO2 31 32 31  GLUCOSE 123* 129* 62*  BUN 30* 30* 33*  CREATININE 2.08* 2.18* 2.31*  CALCIUM 9.2 9.0 9.1    Recent Labs  01/26/16 2106  AST 26  ALT 16*  ALKPHOS 28*  BILITOT 1.2  PROT 7.2  ALBUMIN 2.7*    Recent Labs  01/26/16 2106  01/31/16 0404 02/01/16 0625 02/02/16 0619  WBC 12.3*  < > 14.7* 16.0* 12.9*  NEUTROABS 10.2*  --   --   --   --   HGB 16.9  < > 14.5 14.3 14.3  HCT 49.8  < > 43.8 42.6 43.4  MCV 99.4  < > 100.9* 100.7* 99.5  PLT 147*  < > 182 215 231  < > = values in this interval not displayed. Lab Results  Component Value Date   TSH 1.088 06/28/2012   Lab Results  Component Value Date   HGBA1C 7.4* 01/27/2016   Lab Results  Component Value Date   CHOL  06/15/2007    126        ATP III CLASSIFICATION:  <200     mg/dL   Desirable  200-239  mg/dL   Borderline High  >=240    mg/dL   High   HDL 24* 06/15/2007   LDLCALC  06/15/2007    83        Total Cholesterol/HDL:CHD Risk  Coronary Heart Disease Risk Table                     Men   Women  1/2 Average Risk   3.4   3.3   TRIG 82 11/15/2013   CHOLHDL 5.3 06/15/2007    Significant Diagnostic  Results in last 30 days:  Dg Chest Port 1 View  01/26/2016  CLINICAL DATA:  severe short of breath for 3 days EXAM: PORTABLE CHEST 1 VIEW COMPARISON:  None. FINDINGS: LEFT-sided pacemaker overlies stable enlarged cardiac silhouette. There is central venous congestion. No focal infiltrate. No pneumothorax. IMPRESSION: Cardiomegaly and central venous congestion. Electronically Signed   By: Suzy Bouchard M.D.   On: 01/26/2016 21:59    Assessment/Plan  1. Right leg pain Discontinue oxycodone/acetaminophen 5/325 Start oxycodone/acetaminophen 10/325 one every 4 hours when necessary pain

## 2016-02-19 ENCOUNTER — Other Ambulatory Visit: Payer: Self-pay | Admitting: *Deleted

## 2016-02-19 MED ORDER — OXYCODONE-ACETAMINOPHEN 5-325 MG PO TABS: ORAL_TABLET | ORAL | Status: DC

## 2016-02-19 NOTE — Telephone Encounter (Signed)
[facility] 

## 2016-02-22 ENCOUNTER — Encounter: Payer: Self-pay | Admitting: Internal Medicine

## 2016-02-22 ENCOUNTER — Encounter: Payer: Self-pay | Admitting: Cardiology

## 2016-02-22 ENCOUNTER — Ambulatory Visit (INDEPENDENT_AMBULATORY_CARE_PROVIDER_SITE_OTHER): Payer: Medicare Other | Admitting: *Deleted

## 2016-02-22 ENCOUNTER — Ambulatory Visit (INDEPENDENT_AMBULATORY_CARE_PROVIDER_SITE_OTHER): Payer: Medicare Other | Admitting: Cardiology

## 2016-02-22 VITALS — BP 136/72 | HR 70 | Ht 71.0 in | Wt 301.0 lb

## 2016-02-22 DIAGNOSIS — E785 Hyperlipidemia, unspecified: Secondary | ICD-10-CM | POA: Diagnosis not present

## 2016-02-22 DIAGNOSIS — Z9114 Patient's other noncompliance with medication regimen: Secondary | ICD-10-CM | POA: Insufficient documentation

## 2016-02-22 DIAGNOSIS — Z7901 Long term (current) use of anticoagulants: Secondary | ICD-10-CM

## 2016-02-22 DIAGNOSIS — I5042 Chronic combined systolic (congestive) and diastolic (congestive) heart failure: Secondary | ICD-10-CM

## 2016-02-22 DIAGNOSIS — I4892 Unspecified atrial flutter: Secondary | ICD-10-CM

## 2016-02-22 DIAGNOSIS — I480 Paroxysmal atrial fibrillation: Secondary | ICD-10-CM

## 2016-02-22 DIAGNOSIS — I5043 Acute on chronic combined systolic (congestive) and diastolic (congestive) heart failure: Secondary | ICD-10-CM

## 2016-02-22 DIAGNOSIS — I10 Essential (primary) hypertension: Secondary | ICD-10-CM

## 2016-02-22 DIAGNOSIS — Z91148 Patient's other noncompliance with medication regimen for other reason: Secondary | ICD-10-CM

## 2016-02-22 DIAGNOSIS — Z950 Presence of cardiac pacemaker: Secondary | ICD-10-CM

## 2016-02-22 DIAGNOSIS — Z9119 Patient's noncompliance with other medical treatment and regimen: Secondary | ICD-10-CM

## 2016-02-22 HISTORY — DX: Patient's other noncompliance with medication regimen: Z91.14

## 2016-02-22 HISTORY — DX: Long term (current) use of anticoagulants: Z79.01

## 2016-02-22 HISTORY — DX: Presence of cardiac pacemaker: Z95.0

## 2016-02-22 HISTORY — DX: Unspecified atrial flutter: I48.92

## 2016-02-22 HISTORY — DX: Patient's other noncompliance with medication regimen for other reason: Z91.148

## 2016-02-22 LAB — CUP PACEART INCLINIC DEVICE CHECK
Brady Statistic AS VP Percent: 90.65 %
Brady Statistic AS VS Percent: 9.35 %
Brady Statistic RV Percent Paced: 90.97 %
Date Time Interrogation Session: 20170518122937
Implantable Lead Implant Date: 20080911
Implantable Lead Implant Date: 20080911
Implantable Lead Location: 753858
Implantable Lead Location: 753859
Lead Channel Impedance Value: 247 Ohm
Lead Channel Impedance Value: 380 Ohm
Lead Channel Impedance Value: 418 Ohm
Lead Channel Impedance Value: 456 Ohm
Lead Channel Impedance Value: 532 Ohm
Lead Channel Impedance Value: 570 Ohm
Lead Channel Pacing Threshold Pulse Width: 1.5 ms
Lead Channel Sensing Intrinsic Amplitude: 4.25 mV
Lead Channel Setting Pacing Pulse Width: 1.5 ms
Lead Channel Setting Sensing Sensitivity: 0.9 mV
MDC IDC LEAD IMPLANT DT: 20080911
MDC IDC LEAD LOCATION: 753860
MDC IDC LEAD MODEL: 4194
MDC IDC MSMT BATTERY REMAINING LONGEVITY: 29 mo
MDC IDC MSMT BATTERY VOLTAGE: 2.97 V
MDC IDC MSMT LEADCHNL LV IMPEDANCE VALUE: 665 Ohm
MDC IDC MSMT LEADCHNL LV IMPEDANCE VALUE: 684 Ohm
MDC IDC MSMT LEADCHNL LV PACING THRESHOLD AMPLITUDE: 2.5 V
MDC IDC MSMT LEADCHNL RA IMPEDANCE VALUE: 494 Ohm
MDC IDC MSMT LEADCHNL RA SENSING INTR AMPL: 4.375 mV
MDC IDC MSMT LEADCHNL RV PACING THRESHOLD AMPLITUDE: 0.5 V
MDC IDC MSMT LEADCHNL RV PACING THRESHOLD PULSEWIDTH: 0.4 ms
MDC IDC SET LEADCHNL LV PACING AMPLITUDE: 4 V
MDC IDC SET LEADCHNL RV PACING AMPLITUDE: 2 V
MDC IDC SET LEADCHNL RV PACING PULSEWIDTH: 0.4 ms
MDC IDC STAT BRADY AP VP PERCENT: 0 %
MDC IDC STAT BRADY AP VS PERCENT: 0 %
MDC IDC STAT BRADY RA PERCENT PACED: 0 %

## 2016-02-22 LAB — COMPREHENSIVE METABOLIC PANEL
ALT: 21 U/L (ref 9–46)
AST: 20 U/L (ref 10–35)
Albumin: 3.3 g/dL — ABNORMAL LOW (ref 3.6–5.1)
Alkaline Phosphatase: 34 U/L — ABNORMAL LOW (ref 40–115)
BUN: 53 mg/dL — ABNORMAL HIGH (ref 7–25)
CO2: 26 mmol/L (ref 20–31)
Calcium: 9.2 mg/dL (ref 8.6–10.3)
Chloride: 94 mmol/L — ABNORMAL LOW (ref 98–110)
Creat: 2.04 mg/dL — ABNORMAL HIGH (ref 0.70–1.25)
Glucose, Bld: 261 mg/dL — ABNORMAL HIGH (ref 65–99)
Potassium: 4.5 mmol/L (ref 3.5–5.3)
Sodium: 130 mmol/L — ABNORMAL LOW (ref 135–146)
Total Bilirubin: 1.1 mg/dL (ref 0.2–1.2)
Total Protein: 7.6 g/dL (ref 6.1–8.1)

## 2016-02-22 LAB — BRAIN NATRIURETIC PEPTIDE: Brain Natriuretic Peptide: 40.2 pg/mL (ref <100)

## 2016-02-22 MED ORDER — ISOSORB DINITRATE-HYDRALAZINE 20-37.5 MG PO TABS: 1.0000 | ORAL_TABLET | Freq: Two times a day (BID) | ORAL | Status: DC

## 2016-02-22 MED ORDER — SPIRONOLACTONE 25 MG PO TABS: 25.0000 mg | ORAL_TABLET | Freq: Two times a day (BID) | ORAL | Status: DC

## 2016-02-22 NOTE — Progress Notes (Signed)
CRT-P check in clinic. Normal device function. Thresholds, sensing, impedances consistent with previous measurements. Device programmed to maximize longevity. Permanent AF + Coumadin. Bi-V pacing 91%. Device programmed at appropriate safety margins. Histogram distribution appropriate for patient activity level. Device programmed to optimize intrinsic conduction. Estimated longevity 2 years. Plan to enroll pt in [facility] and pt will f/u with JA in 20mon. Patient education completed. ? turn rate response on at next OV.

## 2016-02-22 NOTE — Progress Notes (Signed)
Cardiology Office Note    Date:  02/22/2016   ID:  Kurt Cross, DOB [birth year], MRN [id number]  PCP:  Kurt Gravel, MD  Cardiologist:  Dr Kurt Cross -->  Kurt Dawley, MD   CC: [facility], post hospital follow up   History of Present Illness:  Kurt Cross is a 65 y.o. male with medical history significant of diabetes mellitus,COPD, combined systolic and diastolic heart failure, chronic atrial fibrillation/flutter in the past requiring ventricular pacemaker secondary to severe bradycardia not on anticoagulation possibly due to noncompliance. sleep apnea, morbid obesity, diabetes, hypertension and abdominal aortic aneurysm. He presented to the ED on 02/06/2016 with acute respiratory failure and oxygen desaturation done down to 78%,requiring therapy with BiPAP. He was diagnosed with acute on chronic respiratory failure with hypoxia secondary to acute on chronic combined systolic and diastolic CHF with LVEF [id number] apnea and morbid obesity.the patient was diuresed aggressively with IV diuresis total of 16.3 L during the admission, his discharge weight was  Lbs, he was discharged on Lasix 40 mg daily, as his creatinine worsened from baseline 1.8 ->2.3, his Aldactone was also held. He was considered for atrial flutter ablation in the past, however he was noncompliant in the past with his Coumadin so he has to show he can be compliant from before we would consider him for ablation referral. He also had elevated troponin during the hospital stay that was attributed to demand ischemia and CK D stage III.  02/22/2016 - the patient is coming 14 days post discharge from [facility], he states that he feels significantly better he stays in nursing home there is also a rehabilitation facility and is progressing well. He states that his lower extremities are still swollen but significantly improved from before, he denies any orthopnea or paroxysmal nocturnal dyspnea no palpitations or syncope. He denies  chest pain. He has mild shortness of breath as his oxygen having is currently nonfunctioning and they're looking for replacement. He is compliant with his medicines, his only concern is that he is not able to take BiDil 3 times a day due to time constraints and would like to switch it to twice a day.  Past Medical History  Diagnosis Date  . Sleep apnea   . Diabetes mellitus, type 2 ([place])   . Hypertension   . Hyperlipidemia   . CHF (congestive heart failure), NYHA class II ([place])   . Hernia of abdominal cavity   . AAA (abdominal aortic aneurysm)/ 4.5 cm ascending per CT angio 08/01/13 08/12/2013  . 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 Constipation, chronic 08/03/2013  . Benign hypertension   . Hypokalemia   . OSA (obstructive sleep apnea)   . Neuropathy ([place])   . Hx of Mobitz type II block     s/p PPM Bi-Ven  . Atrial fibrillation ([place])   . Cough   . Cellulitis   . Kidney failure   . Bradycardia   . Hypoxemia   . Debility 02/10/2016  . Chronic respiratory failure ([place]) 02/10/2016    Past Surgical History  Procedure Laterality Date  . Appendectomy    . Tonsillectomy and adenoidectomy    . Nasal septum surgery    . Bowel resection  1980s?    ?colectomy/ostomy - Dr Elesa Hacker  . Ostomy take down  1980s?    Dr Elesa Hacker  . Incisional hernia repair  1980s?    Dr. Lindon Romp - w mesh  .  Knee surgery Right   . Incision and drainage abscess N/A 08/14/2013    Procedure: INCISION AND DRAINAGE ABSCESS, mesh ;  Surgeon: [name], MD;  Location: [facility]S;  Service: General;  Laterality: N/A;  . Left heart catheterization with coronary angiogram N/A 03/17/2014    Procedure: LEFT HEART CATHETERIZATION WITH CORONARY ANGIOGRAM;  Surgeon: [name], MD;  Location: [facility] C[facility] LAB;  Service: Cardiovascular;  Laterality: N/A;    Current Medications: Outpatient Prescriptions Prior to Visit  Medication Sig Dispense Refill  . albuterol  (PROVENTIL) (2.5 MG/3ML) 0.083% nebulizer solution Take 3 mLs (2.5 mg total) by nebulization every 4 (four) hours as needed for wheezing or shortness of breath. 75 mL 12  . carvedilol (COREG) 25 MG tablet Take 25 mg by mouth 2 (two) times daily.  3  . furosemide (LASIX) 80 MG tablet Take 80 mg by mouth 2 (two) times daily.    [name] Kitchen HUMALOG KWIKPEN 100 UNIT/ML KiwkPen Sliding scale 150-200 5 units; 201-300 8 units, 301-350 10units, > 12 units.  6  . insulin lispro (HUMALOG) 100 UNIT/ML cartridge Inject into the skin. 6 units with meals for CBG > 150    . oxyCODONE-acetaminophen (PERCOCET/ROXICET) 5-325 MG tablet Take one tablet by mouth every four hours for pain. Do not exceed 4gm of Tylenol in 24 hours 180 tablet 0  . rosuvastatin (CRESTOR) 10 MG tablet Take 10 mg by mouth daily.    [name] Kitchen senna-docusate (SENOKOT-S) 8.6-50 MG tablet Take 2 tablets by mouth 2 (two) times daily.    [name] Kitchen warfarin (COUMADIN) 4 MG tablet Take 8 mg by mouth daily at 6 PM.    . isosorbide-hydrALAZINE (BIDIL) 20-37.5 MG tablet Take 1 tablet by mouth 3 (three) times daily.    [name] Kitchen spironolactone (ALDACTONE) 25 MG tablet Take 25 mg by mouth 2 (two) times daily.     No facility-administered medications prior to visit.     Allergies:   Review of patient's allergies indicates no known allergies.   Social History   Social History  . Marital Status: Married    Spouse Name: N/A  . Number of Children: N/A  . Years of Education: N/A   Social History Main Topics  . Smoking status: Former Smoker -- 1.00 packs/day for 40 years    Types: Cigarettes    Quit date: 08/21/2012  . Smokeless tobacco: Never Used  . Alcohol Use: 0.0 oz/week    0 Standard drinks or equivalent per week     Comment: occasional  . Drug Use: No  . Sexual Activity: Not Asked   Other Topics Concern  . None   Social History Narrative   Lives at [place] since 02/06/16   Married -Kurt Cross   Former smoker -stopped 2013   No Advance Directives     Family  History:  The patient's family history includes Alcoholism in his father; Diabetes in his mother; Heart Problems in his maternal grandmother.   ROS:   Please see the history of present illness.    ROS All other systems reviewed and are negative.   PHYSICAL EXAM:   VS:  BP 136/72 mmHg  Pulse 70  Ht 5\' 11"  (1.803 m)  Wt 301 lb (136.533 kg)  BMI 42.00 kg/m2   GEN: obese, not in no acute distress HEENT: normal Neck: no JVD, carotid bruits, or masses Cardiac: RRR; no murmurs, rubs, or gallops,severe bilateral lower extremity edema up to the knees left worse than right, with overlying cellulitis on the left shin.  Respiratory:  clear to auscultation bilaterally, normal work of breathing GI: soft, nontender, nondistended, + BS MS: no deformity or atrophy Skin: warm and dry, no rash Neuro:  Alert and Oriented x 3, Strength and sensation are intact Psych: euthymic mood, full affect  Wt Readings from Last 3 Encounters:  02/22/16 301 lb (136.533 kg)  02/12/16 306 lb (138.801 kg)  02/08/16 301 lb (136.533 kg)    Studies/Labs Reviewed:   EKG:  EKG is ordered today.  The ekg ordered today demonstrates ventricular pacemaker with rate 70 bpm, underlying rhythm atrial fibrillation. This is unchanged from prior EKG.  Recent Labs: 01/26/2016: ALT 16* 01/27/2016: B Natriuretic Peptide 409.7* 02/02/2016: Hemoglobin 14.3; Platelets 231 02/06/2016: BUN 33*; Creatinine, Ser 2.31*; Potassium 4.5; Sodium 139   Lipid Panel    Component Value Date/Time   CHOL  06/15/2007 0501    126        ATP III CLASSIFICATION:  <200     mg/dL   Desirable  200-239  mg/dL   Borderline High  >=240    mg/dL   High   TRIG 82 11/15/2013 0425   HDL 24* 06/15/2007 0501   CHOLHDL 5.3 06/15/2007 0501   VLDL 19 06/15/2007 0501   LDLCALC  06/15/2007 0501    83        Total Cholesterol/HDL:CHD Risk Coronary Heart Disease Risk Table                     Men   Women  1/2 Average Risk   3.4   3.3    Additional studies/  records that were reviewed today include:  TTE 01/28/2016 Study Conclusions  - Left ventricle: The cavity size was normal. There was mild  concentric hypertrophy. Systolic function was moderately to  severely reduced. The estimated ejection fraction was in the  range of 30% to 35%. The study is not technically sufficient to  allow evaluation of LV diastolic function. - Aortic valve: There was mild regurgitation. - Mitral valve: There was mild to moderate regurgitation directed  centrally. - Left atrium: The atrium was severely dilated. - Right atrium: The atrium was moderately dilated. - Tricuspid valve: There was moderate regurgitation. - Pulmonary arteries: Systolic pressure was moderately increased.  PA peak pressure: 55 mm Hg (S).    ASSESSMENT:    1. Chronic combined systolic and diastolic CHF (congestive heart failure) ([place])   2. Essential hypertension   3. Acute on chronic combined systolic and diastolic CHF, NYHA class 2 ([place])   4. Hyperlipidemia   5. S/P placement of cardiac pacemaker   6. Paroxysmal atrial fibrillation (HCC)   7. Atrial flutter, unspecified type ([place])   8. Chronic anticoagulation   9. H/O medication noncompliance      PLAN:  In order of problems listed above:  1. Acute on chronic systolic and diastolic congestive heart failure clinically improved, 2 weeks posthospitalization patient has the same weight he continues taking Lasix 80 mg by mouth twice a day, I will check his creatinine and electrolytes today and adjust if needed but this seems to be adequate dose for him he feels significantly better. His baseline weight is 301 pounds. - Restart Aldactone 25 PO BID - Switch BiDil TID to BID, he is explained that 3 times a day would be preferred however it it is better that he takes at least some.  2. Functioning pacemaker: implanted for symptomatic bradycardia, interpretation schedule for today.  3. Chronic atrial fibrillation and atrial  flutter: rate is controlled on Coreg. Coumadin for anticoagulation. Compliant now.  4. Stage stage III chronic kidney disease: Creatinine increasing w/ diuresis, we will recheck his BMP today.  5. Hypertensive heart disease with heart failure - blood pressure currently controlled continue current regimen.   Medication Adjustments/Labs and Tests Ordered: Current medicines are reviewed at length with the patient today.  Concerns regarding medicines are outlined above.  Medication changes, Labs and Tests ordered today are listed in the Patient Instructions below. Patient Instructions  Medication Instructions:   TAKE BIDIL 20-37.5 MG BY MOUTH TWICE DAILY   Labwork:  TODAY--CMET AND BNP    Follow-Up:  2 MONTHS WITH DR Meda Coffee       If you need a refill on your cardiac medications before your next appointment, please call your pharmacy.       Signed, Kurt Dawley, MD  02/22/2016 11:47 AM    [address], [place],   [address] Phone: [phone]; Fax: [phone]

## 2016-02-22 NOTE — Patient Instructions (Signed)
Medication Instructions:   TAKE BIDIL 20-37.5 MG BY MOUTH TWICE DAILY   Labwork:  TODAY--CMET AND BNP    Follow-Up:  2 MONTHS WITH DR Meda Coffee       If you need a refill on your cardiac medications before your next appointment, please call your pharmacy.

## 2016-02-23 ENCOUNTER — Telehealth: Payer: Self-pay | Admitting: Cardiology

## 2016-02-23 NOTE — Telephone Encounter (Signed)
Spoke with [place] Nurse at [facility] to inform her that Dr Meda Coffee ordered for the pt to take Bidil 20/37.5mg  po bid, not tid.  Informed [place] that Dr Meda Coffee decreased this to BID yesterday at the pts OV with her.  [place] verbalized understanding of clarification order given and agrees with this plan.

## 2016-02-23 NOTE — Telephone Encounter (Signed)
New message      Pt c/o medication issue:  1. Name of Medication: Bidil  2. How are you currently taking this medication (dosage and times per day)? Per nursing staff -[id number] mg po uncertain of the way the pt is suppose to take the medication  3. Are you having a reaction (difficulty breathing--STAT)? no  4. What is your medication issue? The staff needs clarification on the way the pt is suppose to take the medication, it was written for the pt to take the med./3 times daily/ put the staff has a copy from [place] for the pt to the medication twice daily

## 2016-02-26 ENCOUNTER — Other Ambulatory Visit: Payer: Self-pay | Admitting: Internal Medicine

## 2016-02-26 ENCOUNTER — Non-Acute Institutional Stay (SKILLED_NURSING_FACILITY): Payer: Medicare Other | Admitting: Internal Medicine

## 2016-02-26 DIAGNOSIS — J9611 Chronic respiratory failure with hypoxia: Secondary | ICD-10-CM | POA: Diagnosis not present

## 2016-02-26 DIAGNOSIS — I5042 Chronic combined systolic (congestive) and diastolic (congestive) heart failure: Secondary | ICD-10-CM

## 2016-02-26 DIAGNOSIS — E1010 Type 1 diabetes mellitus with ketoacidosis without coma: Secondary | ICD-10-CM

## 2016-02-26 DIAGNOSIS — I10 Essential (primary) hypertension: Secondary | ICD-10-CM

## 2016-02-26 DIAGNOSIS — M79604 Pain in right leg: Secondary | ICD-10-CM | POA: Diagnosis not present

## 2016-02-26 NOTE — Progress Notes (Signed)
Patient ID: Kurt Cross, male   DOB: Jun 20, 1951, 65 y.o.   MRN: [id number]    [facility] Room Number: 709  Place of Service: SNF (31)     No Known Allergies  Chief Complaint  Patient presents with  . Discharge Note    HPI:  Patient is requesting discharge today. He was admitted to [facility] on 02/24/2016. He was admitted at that time for short-term rehabilitation secondary to ability as a result of recent hospitalization with acute on chronic systolic and diastolic heart failure. He was somewhat fluid overloaded time of admission. Peripheral edema has improved and he is breathing easier. He is gaining strength.  He lives at home with his wife and children.  Medications: Patient's Medications  New Prescriptions   No medications on file  Previous Medications   ALBUTEROL (PROVENTIL) (2.5 MG/3ML) 0.083% NEBULIZER SOLUTION    Take 3 mLs (2.5 mg total) by nebulization every 4 (four) hours as needed for wheezing or shortness of breath.   CARVEDILOL (COREG) 25 MG TABLET    Take 25 mg by mouth 2 (two) times daily.   FUROSEMIDE (LASIX) 80 MG TABLET    Take 80 mg by mouth 2 (two) times daily.   HUMALOG KWIKPEN 100 UNIT/ML KIWKPEN    Sliding scale 150-200 5 units; 201-300 8 units, 301-350 10units, > 12 units.   INSULIN LISPRO (HUMALOG) 100 UNIT/ML CARTRIDGE    Inject into the skin. 6 units with meals for CBG > 150   ISOSORBIDE-HYDRALAZINE (BIDIL) 20-37.5 MG TABLET    Take 1 tablet by mouth 2 (two) times daily.   OXYCODONE-ACETAMINOPHEN (PERCOCET/ROXICET) 5-325 MG TABLET    Take one tablet by mouth every four hours for pain. Do not exceed 4gm of Tylenol in 24 hours   ROSUVASTATIN (CRESTOR) 10 MG TABLET    Take 10 mg by mouth daily.   SENNA-DOCUSATE (SENOKOT-S) 8.6-50 MG TABLET    Take 2 tablets by mouth 2 (two) times daily.   SPIRONOLACTONE (ALDACTONE) 25 MG TABLET    Take 1 tablet (25 mg total) by mouth 2 (two) times daily.   WARFARIN (COUMADIN) 4 MG  TABLET    Take 8 mg by mouth daily at 6 PM.  Modified Medications   No medications on file  Discontinued Medications   No medications on file     Review of Systems  Constitutional: Negative for fever, chills, diaphoresis, activity change, appetite change, fatigue and unexpected weight change.       Morbid obesity  HENT: Negative for congestion, dental problem, drooling, ear discharge, ear pain, facial swelling, hearing loss, mouth sores, nosebleeds, trouble swallowing and voice change.        Missing many teeth  Eyes: Negative for photophobia, discharge, redness and itching.  Respiratory: Negative for apnea, cough, choking, chest tightness, shortness of breath and wheezing.        History of OSA, chronic respiratory failure, dyspnea, and poor compliance with use of home oxygen.  Cardiovascular: Positive for leg swelling. Negative for chest pain and palpitations.       Cardiomyopathy with ejection fraction 40% Chronic BLE edema, RLE 1+, LLE 2+  Gastrointestinal: Positive for constipation. Negative for nausea, vomiting, abdominal pain, diarrhea, blood in stool, abdominal distention, anal bleeding and rectal pain.       Long history of multiple abdominal surgeries related to ventral hernias and infected mesh. At one point he had a perforation of the bowel resulting in a short gut syndrome.  Endocrine: Negative for cold intolerance and heat intolerance.       Diabetic  Genitourinary: Negative for dysuria, urgency, frequency, flank pain, decreased urine volume, enuresis and difficulty urinating.  Musculoskeletal: Positive for arthralgias and gait problem. Negative for myalgias, back pain, joint swelling, neck pain and neck stiffness.       Generalized weakness  Skin: Negative for color change, pallor, rash and wound.       Bilateral chronic venous dermatitis. Skin peeling on left leg secondary to recent cellulitis.  Neurological: Negative for dizziness, tremors, seizures, syncope, facial  asymmetry, speech difficulty, weakness, light-headedness, numbness and headaches.  Hematological: Negative for adenopathy. Does not bruise/bleed easily.  Psychiatric/Behavioral: Positive for dysphoric mood. Negative for suicidal ideas, hallucinations, behavioral problems, confusion, sleep disturbance, self-injury, decreased concentration and agitation. The patient is not nervous/anxious and is not hyperactive.     Filed Vitals:   02/26/16 1510  BP: 120/70  Pulse: 70  Temp: 97.4 F (36.3 C)  Resp: 18  Height: _0  (1.778 m)  Weight: 301 lb (136.533 kg)  SpO2: 95%   Wt Readings from Last 3 Encounters:  02/26/16 301 lb (136.533 kg)  02/22/16 301 lb (136.533 kg)  02/12/16 306 lb (138.801 kg)    Body mass index is 43.19 kg/(m^2).  Physical Exam  Constitutional: He is oriented to person, place, and time. No distress.  Morbid obesity  HENT:  Missing teeth  Eyes: Conjunctivae and EOM are normal. Pupils are equal, round, and reactive to light. No scleral icterus.  Neck: No JVD present. No tracheal deviation present. No thyromegaly present.  Cardiovascular: Normal rate, regular rhythm and normal heart sounds.  Exam reveals no gallop and no friction rub.   No murmur heard. Pulmonary/Chest:  Pacemaker left upper chest wall  Abdominal:  Multiple abdominal scars  Musculoskeletal: He exhibits edema (2+ bipedal).  Generalized weakness  Lymphadenopathy:    He has no cervical adenopathy.  Neurological: He is alert and oriented to person, place, and time. No cranial nerve deficit. Coordination normal.  Skin: Skin is warm and dry. He is not diaphoretic. No erythema.  abd surgical scars. Peeling skin on left leg secondary to recent cellulitis  Psychiatric:  Depressed, slightly angry affect.     Labs reviewed: Lab Summary Latest Ref Rng 02/22/2016 02/06/2016 02/05/2016 02/04/2016 02/03/2016 02/02/2016 02/01/2016  Hemoglobin 13.0 - 17.0 g/dL (None) (None) (None) (None) (None) 14.3 14.3    Hematocrit 39.0 - 52.0 % (None) (None) (None) (None) (None) 43.4 42.6  White count 4.0 - 10.5 K/uL (None) (None) (None) (None) (None) 12.9(H) 16.0(H)  Platelet count 150 - 400 K/uL (None) (None) (None) (None) (None) 231 215  Sodium 135 - 146 mmol/L 130(L) 139 138 137 138 139 137  Potassium 3.5 - 5.3 mmol/L 4.5 4.5 3.8 3.8 3.8 3.6 3.6  Calcium 8.6 - 10.3 mg/dL 9.2 9.1 9.0 9.2 9.2 9.0 8.7(L)  Phosphorus - (None) (None) (None) (None) (None) (None) (None)  Creatinine 0.70 - 1.25 mg/dL 2.04(H) 2.31(H) 2.18(H) 2.08(H) 1.97(H) 1.85(H) 1.82(H)  AST 10 - 35 U/L 20 (None) (None) (None) (None) (None) (None)  Alk Phos 40 - 115 U/L 34(L) (None) (None) (None) (None) (None) (None)  Bilirubin 0.2 - 1.2 mg/dL 1.1 (None) (None) (None) (None) (None) (None)  Glucose 65 - 99 mg/dL 261(H) 62(L) 129(H) 123(H) 126(H) 151(H) 176(H)  Cholesterol - (None) (None) (None) (None) (None) (None) (None)  HDL cholesterol - (None) (None) (None) (None) (None) (None) (None)  Triglycerides - (None) (None) (None) (None) (None) (None) (  None)  LDL Direct - (None) (None) (None) (None) (None) (None) (None)  LDL Calc - (None) (None) (None) (None) (None) (None) (None)  Total protein 6.1 - 8.1 g/dL 7.6 (None) (None) (None) (None) (None) (None)  Albumin 3.6 - 5.1 g/dL 3.3(L) (None) (None) (None) (None) (None) (None)   Lab Results  Component Value Date   TSH 1.088 06/28/2012   Lab Results  Component Value Date   BUN 53* 02/22/2016   BUN 33* 02/06/2016   BUN 30* 02/05/2016   Lab Results  Component Value Date   CREATININE 2.04* 02/22/2016   CREATININE 2.31* 02/06/2016   CREATININE 2.18* 02/05/2016   Lab Results  Component Value Date   HGBA1C 7.4* 01/27/2016   HGBA1C 9.5* 08/02/2013   HGBA1C 9.6* 08/24/2012       Assessment/Plan  1. Chronic combined systolic and diastolic CHF (congestive heart failure) ([place]) Patient is reasonably well compensated on current  2. Chronic respiratory failure with hypoxia  (HCC) Improvement in dyspnea  3. Uncontrolled type 1 diabetes mellitus with ketoacidosis without coma ([place]) Diabetic control has improved  4. Essential hypertension Modest elevation in blood pressure  5. Obesity, Class III, BMI 40-49.9 (morbid obesity) ([place]) Patient has lost some weight over the last 3 weeks  6. Right leg pain Chronic column extending back at least 3 years. Using narcotics to relieve his pain. Continue current medications.

## 2016-03-02 DIAGNOSIS — Z4800 Encounter for change or removal of nonsurgical wound dressing: Secondary | ICD-10-CM | POA: Diagnosis not present

## 2016-03-02 DIAGNOSIS — S31103D Unspecified open wound of abdominal wall, right lower quadrant without penetration into peritoneal cavity, subsequent encounter: Secondary | ICD-10-CM | POA: Diagnosis not present

## 2016-03-02 DIAGNOSIS — I5042 Chronic combined systolic (congestive) and diastolic (congestive) heart failure: Secondary | ICD-10-CM | POA: Diagnosis not present

## 2016-03-02 DIAGNOSIS — I130 Hypertensive heart and chronic kidney disease with heart failure and stage 1 through stage 4 chronic kidney disease, or unspecified chronic kidney disease: Secondary | ICD-10-CM | POA: Diagnosis not present

## 2016-03-02 DIAGNOSIS — J9611 Chronic respiratory failure with hypoxia: Secondary | ICD-10-CM | POA: Diagnosis not present

## 2016-03-02 DIAGNOSIS — Z5181 Encounter for therapeutic drug level monitoring: Secondary | ICD-10-CM | POA: Diagnosis not present

## 2016-03-02 DIAGNOSIS — N182 Chronic kidney disease, stage 2 (mild): Secondary | ICD-10-CM | POA: Diagnosis not present

## 2016-03-02 DIAGNOSIS — G4733 Obstructive sleep apnea (adult) (pediatric): Secondary | ICD-10-CM | POA: Diagnosis not present

## 2016-03-02 DIAGNOSIS — I482 Chronic atrial fibrillation: Secondary | ICD-10-CM | POA: Diagnosis not present

## 2016-03-02 DIAGNOSIS — E1122 Type 2 diabetes mellitus with diabetic chronic kidney disease: Secondary | ICD-10-CM | POA: Diagnosis not present

## 2016-03-02 DIAGNOSIS — Z7901 Long term (current) use of anticoagulants: Secondary | ICD-10-CM | POA: Diagnosis not present

## 2016-03-02 DIAGNOSIS — L03115 Cellulitis of right lower limb: Secondary | ICD-10-CM | POA: Diagnosis not present

## 2016-03-06 DIAGNOSIS — I482 Chronic atrial fibrillation: Secondary | ICD-10-CM | POA: Diagnosis not present

## 2016-03-06 DIAGNOSIS — I5042 Chronic combined systolic (congestive) and diastolic (congestive) heart failure: Secondary | ICD-10-CM | POA: Diagnosis not present

## 2016-03-06 DIAGNOSIS — J9611 Chronic respiratory failure with hypoxia: Secondary | ICD-10-CM | POA: Diagnosis not present

## 2016-03-06 DIAGNOSIS — L089 Local infection of the skin and subcutaneous tissue, unspecified: Secondary | ICD-10-CM | POA: Diagnosis not present

## 2016-03-06 DIAGNOSIS — G4733 Obstructive sleep apnea (adult) (pediatric): Secondary | ICD-10-CM | POA: Diagnosis not present

## 2016-03-06 DIAGNOSIS — Z4801 Encounter for change or removal of surgical wound dressing: Secondary | ICD-10-CM | POA: Diagnosis not present

## 2016-03-06 DIAGNOSIS — N182 Chronic kidney disease, stage 2 (mild): Secondary | ICD-10-CM | POA: Diagnosis not present

## 2016-03-06 DIAGNOSIS — Z5181 Encounter for therapeutic drug level monitoring: Secondary | ICD-10-CM | POA: Diagnosis not present

## 2016-03-06 DIAGNOSIS — Z4800 Encounter for change or removal of nonsurgical wound dressing: Secondary | ICD-10-CM | POA: Diagnosis not present

## 2016-03-06 DIAGNOSIS — S31109A Unspecified open wound of abdominal wall, unspecified quadrant without penetration into peritoneal cavity, initial encounter: Secondary | ICD-10-CM | POA: Diagnosis not present

## 2016-03-06 DIAGNOSIS — S81802A Unspecified open wound, left lower leg, initial encounter: Secondary | ICD-10-CM | POA: Diagnosis not present

## 2016-03-06 DIAGNOSIS — Z933 Colostomy status: Secondary | ICD-10-CM | POA: Diagnosis not present

## 2016-03-06 DIAGNOSIS — L03115 Cellulitis of right lower limb: Secondary | ICD-10-CM | POA: Diagnosis not present

## 2016-03-06 DIAGNOSIS — E1122 Type 2 diabetes mellitus with diabetic chronic kidney disease: Secondary | ICD-10-CM | POA: Diagnosis not present

## 2016-03-06 DIAGNOSIS — S31103D Unspecified open wound of abdominal wall, right lower quadrant without penetration into peritoneal cavity, subsequent encounter: Secondary | ICD-10-CM | POA: Diagnosis not present

## 2016-03-06 DIAGNOSIS — I130 Hypertensive heart and chronic kidney disease with heart failure and stage 1 through stage 4 chronic kidney disease, or unspecified chronic kidney disease: Secondary | ICD-10-CM | POA: Diagnosis not present

## 2016-03-06 DIAGNOSIS — Z7901 Long term (current) use of anticoagulants: Secondary | ICD-10-CM | POA: Diagnosis not present

## 2016-03-07 DIAGNOSIS — N182 Chronic kidney disease, stage 2 (mild): Secondary | ICD-10-CM | POA: Diagnosis not present

## 2016-03-07 DIAGNOSIS — L03115 Cellulitis of right lower limb: Secondary | ICD-10-CM | POA: Diagnosis not present

## 2016-03-07 DIAGNOSIS — I130 Hypertensive heart and chronic kidney disease with heart failure and stage 1 through stage 4 chronic kidney disease, or unspecified chronic kidney disease: Secondary | ICD-10-CM | POA: Diagnosis not present

## 2016-03-07 DIAGNOSIS — Z7901 Long term (current) use of anticoagulants: Secondary | ICD-10-CM | POA: Diagnosis not present

## 2016-03-07 DIAGNOSIS — Z5181 Encounter for therapeutic drug level monitoring: Secondary | ICD-10-CM | POA: Diagnosis not present

## 2016-03-07 DIAGNOSIS — G4733 Obstructive sleep apnea (adult) (pediatric): Secondary | ICD-10-CM | POA: Diagnosis not present

## 2016-03-07 DIAGNOSIS — E1122 Type 2 diabetes mellitus with diabetic chronic kidney disease: Secondary | ICD-10-CM | POA: Diagnosis not present

## 2016-03-07 DIAGNOSIS — J9611 Chronic respiratory failure with hypoxia: Secondary | ICD-10-CM | POA: Diagnosis not present

## 2016-03-07 DIAGNOSIS — I5042 Chronic combined systolic (congestive) and diastolic (congestive) heart failure: Secondary | ICD-10-CM | POA: Diagnosis not present

## 2016-03-07 DIAGNOSIS — I482 Chronic atrial fibrillation: Secondary | ICD-10-CM | POA: Diagnosis not present

## 2016-03-07 DIAGNOSIS — S31103D Unspecified open wound of abdominal wall, right lower quadrant without penetration into peritoneal cavity, subsequent encounter: Secondary | ICD-10-CM | POA: Diagnosis not present

## 2016-03-07 DIAGNOSIS — Z4800 Encounter for change or removal of nonsurgical wound dressing: Secondary | ICD-10-CM | POA: Diagnosis not present

## 2016-03-12 DIAGNOSIS — N182 Chronic kidney disease, stage 2 (mild): Secondary | ICD-10-CM | POA: Diagnosis not present

## 2016-03-12 DIAGNOSIS — L03115 Cellulitis of right lower limb: Secondary | ICD-10-CM | POA: Diagnosis not present

## 2016-03-12 DIAGNOSIS — I5042 Chronic combined systolic (congestive) and diastolic (congestive) heart failure: Secondary | ICD-10-CM | POA: Diagnosis not present

## 2016-03-12 DIAGNOSIS — G4733 Obstructive sleep apnea (adult) (pediatric): Secondary | ICD-10-CM | POA: Diagnosis not present

## 2016-03-12 DIAGNOSIS — Z7901 Long term (current) use of anticoagulants: Secondary | ICD-10-CM | POA: Diagnosis not present

## 2016-03-12 DIAGNOSIS — Z5181 Encounter for therapeutic drug level monitoring: Secondary | ICD-10-CM | POA: Diagnosis not present

## 2016-03-12 DIAGNOSIS — Z4800 Encounter for change or removal of nonsurgical wound dressing: Secondary | ICD-10-CM | POA: Diagnosis not present

## 2016-03-12 DIAGNOSIS — S31103D Unspecified open wound of abdominal wall, right lower quadrant without penetration into peritoneal cavity, subsequent encounter: Secondary | ICD-10-CM | POA: Diagnosis not present

## 2016-03-12 DIAGNOSIS — I482 Chronic atrial fibrillation: Secondary | ICD-10-CM | POA: Diagnosis not present

## 2016-03-12 DIAGNOSIS — E1122 Type 2 diabetes mellitus with diabetic chronic kidney disease: Secondary | ICD-10-CM | POA: Diagnosis not present

## 2016-03-12 DIAGNOSIS — I130 Hypertensive heart and chronic kidney disease with heart failure and stage 1 through stage 4 chronic kidney disease, or unspecified chronic kidney disease: Secondary | ICD-10-CM | POA: Diagnosis not present

## 2016-03-12 DIAGNOSIS — J9611 Chronic respiratory failure with hypoxia: Secondary | ICD-10-CM | POA: Diagnosis not present

## 2016-03-14 DIAGNOSIS — Z5181 Encounter for therapeutic drug level monitoring: Secondary | ICD-10-CM | POA: Diagnosis not present

## 2016-03-14 DIAGNOSIS — E1122 Type 2 diabetes mellitus with diabetic chronic kidney disease: Secondary | ICD-10-CM | POA: Diagnosis not present

## 2016-03-14 DIAGNOSIS — S31103D Unspecified open wound of abdominal wall, right lower quadrant without penetration into peritoneal cavity, subsequent encounter: Secondary | ICD-10-CM | POA: Diagnosis not present

## 2016-03-14 DIAGNOSIS — Z7901 Long term (current) use of anticoagulants: Secondary | ICD-10-CM | POA: Diagnosis not present

## 2016-03-14 DIAGNOSIS — N182 Chronic kidney disease, stage 2 (mild): Secondary | ICD-10-CM | POA: Diagnosis not present

## 2016-03-14 DIAGNOSIS — Z4800 Encounter for change or removal of nonsurgical wound dressing: Secondary | ICD-10-CM | POA: Diagnosis not present

## 2016-03-14 DIAGNOSIS — I130 Hypertensive heart and chronic kidney disease with heart failure and stage 1 through stage 4 chronic kidney disease, or unspecified chronic kidney disease: Secondary | ICD-10-CM | POA: Diagnosis not present

## 2016-03-14 DIAGNOSIS — L03115 Cellulitis of right lower limb: Secondary | ICD-10-CM | POA: Diagnosis not present

## 2016-03-14 DIAGNOSIS — J9611 Chronic respiratory failure with hypoxia: Secondary | ICD-10-CM | POA: Diagnosis not present

## 2016-03-14 DIAGNOSIS — I482 Chronic atrial fibrillation: Secondary | ICD-10-CM | POA: Diagnosis not present

## 2016-03-14 DIAGNOSIS — G4733 Obstructive sleep apnea (adult) (pediatric): Secondary | ICD-10-CM | POA: Diagnosis not present

## 2016-03-14 DIAGNOSIS — I5042 Chronic combined systolic (congestive) and diastolic (congestive) heart failure: Secondary | ICD-10-CM | POA: Diagnosis not present

## 2016-03-15 DIAGNOSIS — J9611 Chronic respiratory failure with hypoxia: Secondary | ICD-10-CM | POA: Diagnosis not present

## 2016-03-15 DIAGNOSIS — Z5181 Encounter for therapeutic drug level monitoring: Secondary | ICD-10-CM | POA: Diagnosis not present

## 2016-03-15 DIAGNOSIS — I482 Chronic atrial fibrillation: Secondary | ICD-10-CM | POA: Diagnosis not present

## 2016-03-15 DIAGNOSIS — Z7901 Long term (current) use of anticoagulants: Secondary | ICD-10-CM | POA: Diagnosis not present

## 2016-03-15 DIAGNOSIS — N182 Chronic kidney disease, stage 2 (mild): Secondary | ICD-10-CM | POA: Diagnosis not present

## 2016-03-15 DIAGNOSIS — G4733 Obstructive sleep apnea (adult) (pediatric): Secondary | ICD-10-CM | POA: Diagnosis not present

## 2016-03-15 DIAGNOSIS — E1122 Type 2 diabetes mellitus with diabetic chronic kidney disease: Secondary | ICD-10-CM | POA: Diagnosis not present

## 2016-03-15 DIAGNOSIS — L03115 Cellulitis of right lower limb: Secondary | ICD-10-CM | POA: Diagnosis not present

## 2016-03-15 DIAGNOSIS — Z4800 Encounter for change or removal of nonsurgical wound dressing: Secondary | ICD-10-CM | POA: Diagnosis not present

## 2016-03-15 DIAGNOSIS — I130 Hypertensive heart and chronic kidney disease with heart failure and stage 1 through stage 4 chronic kidney disease, or unspecified chronic kidney disease: Secondary | ICD-10-CM | POA: Diagnosis not present

## 2016-03-15 DIAGNOSIS — S31103D Unspecified open wound of abdominal wall, right lower quadrant without penetration into peritoneal cavity, subsequent encounter: Secondary | ICD-10-CM | POA: Diagnosis not present

## 2016-03-15 DIAGNOSIS — I5042 Chronic combined systolic (congestive) and diastolic (congestive) heart failure: Secondary | ICD-10-CM | POA: Diagnosis not present

## 2016-03-18 DIAGNOSIS — I482 Chronic atrial fibrillation: Secondary | ICD-10-CM | POA: Diagnosis not present

## 2016-03-18 DIAGNOSIS — Z7901 Long term (current) use of anticoagulants: Secondary | ICD-10-CM | POA: Diagnosis not present

## 2016-03-18 DIAGNOSIS — E1122 Type 2 diabetes mellitus with diabetic chronic kidney disease: Secondary | ICD-10-CM | POA: Diagnosis not present

## 2016-03-18 DIAGNOSIS — Z5181 Encounter for therapeutic drug level monitoring: Secondary | ICD-10-CM | POA: Diagnosis not present

## 2016-03-18 DIAGNOSIS — N182 Chronic kidney disease, stage 2 (mild): Secondary | ICD-10-CM | POA: Diagnosis not present

## 2016-03-18 DIAGNOSIS — S31103D Unspecified open wound of abdominal wall, right lower quadrant without penetration into peritoneal cavity, subsequent encounter: Secondary | ICD-10-CM | POA: Diagnosis not present

## 2016-03-18 DIAGNOSIS — J9611 Chronic respiratory failure with hypoxia: Secondary | ICD-10-CM | POA: Diagnosis not present

## 2016-03-18 DIAGNOSIS — I5042 Chronic combined systolic (congestive) and diastolic (congestive) heart failure: Secondary | ICD-10-CM | POA: Diagnosis not present

## 2016-03-18 DIAGNOSIS — Z4800 Encounter for change or removal of nonsurgical wound dressing: Secondary | ICD-10-CM | POA: Diagnosis not present

## 2016-03-18 DIAGNOSIS — I130 Hypertensive heart and chronic kidney disease with heart failure and stage 1 through stage 4 chronic kidney disease, or unspecified chronic kidney disease: Secondary | ICD-10-CM | POA: Diagnosis not present

## 2016-03-18 DIAGNOSIS — G4733 Obstructive sleep apnea (adult) (pediatric): Secondary | ICD-10-CM | POA: Diagnosis not present

## 2016-03-18 DIAGNOSIS — L03115 Cellulitis of right lower limb: Secondary | ICD-10-CM | POA: Diagnosis not present

## 2016-03-19 DIAGNOSIS — E1122 Type 2 diabetes mellitus with diabetic chronic kidney disease: Secondary | ICD-10-CM | POA: Diagnosis not present

## 2016-03-19 DIAGNOSIS — J9611 Chronic respiratory failure with hypoxia: Secondary | ICD-10-CM | POA: Diagnosis not present

## 2016-03-19 DIAGNOSIS — L03115 Cellulitis of right lower limb: Secondary | ICD-10-CM | POA: Diagnosis not present

## 2016-03-19 DIAGNOSIS — I482 Chronic atrial fibrillation: Secondary | ICD-10-CM | POA: Diagnosis not present

## 2016-03-19 DIAGNOSIS — I5042 Chronic combined systolic (congestive) and diastolic (congestive) heart failure: Secondary | ICD-10-CM | POA: Diagnosis not present

## 2016-03-19 DIAGNOSIS — G4733 Obstructive sleep apnea (adult) (pediatric): Secondary | ICD-10-CM | POA: Diagnosis not present

## 2016-03-19 DIAGNOSIS — Z7901 Long term (current) use of anticoagulants: Secondary | ICD-10-CM | POA: Diagnosis not present

## 2016-03-19 DIAGNOSIS — I130 Hypertensive heart and chronic kidney disease with heart failure and stage 1 through stage 4 chronic kidney disease, or unspecified chronic kidney disease: Secondary | ICD-10-CM | POA: Diagnosis not present

## 2016-03-19 DIAGNOSIS — N182 Chronic kidney disease, stage 2 (mild): Secondary | ICD-10-CM | POA: Diagnosis not present

## 2016-03-19 DIAGNOSIS — Z5181 Encounter for therapeutic drug level monitoring: Secondary | ICD-10-CM | POA: Diagnosis not present

## 2016-03-19 DIAGNOSIS — S31103D Unspecified open wound of abdominal wall, right lower quadrant without penetration into peritoneal cavity, subsequent encounter: Secondary | ICD-10-CM | POA: Diagnosis not present

## 2016-03-19 DIAGNOSIS — Z4800 Encounter for change or removal of nonsurgical wound dressing: Secondary | ICD-10-CM | POA: Diagnosis not present

## 2016-03-21 DIAGNOSIS — S31103D Unspecified open wound of abdominal wall, right lower quadrant without penetration into peritoneal cavity, subsequent encounter: Secondary | ICD-10-CM | POA: Diagnosis not present

## 2016-03-21 DIAGNOSIS — N182 Chronic kidney disease, stage 2 (mild): Secondary | ICD-10-CM | POA: Diagnosis not present

## 2016-03-21 DIAGNOSIS — I130 Hypertensive heart and chronic kidney disease with heart failure and stage 1 through stage 4 chronic kidney disease, or unspecified chronic kidney disease: Secondary | ICD-10-CM | POA: Diagnosis not present

## 2016-03-21 DIAGNOSIS — I482 Chronic atrial fibrillation: Secondary | ICD-10-CM | POA: Diagnosis not present

## 2016-03-21 DIAGNOSIS — E1122 Type 2 diabetes mellitus with diabetic chronic kidney disease: Secondary | ICD-10-CM | POA: Diagnosis not present

## 2016-03-21 DIAGNOSIS — Z4800 Encounter for change or removal of nonsurgical wound dressing: Secondary | ICD-10-CM | POA: Diagnosis not present

## 2016-03-21 DIAGNOSIS — G4733 Obstructive sleep apnea (adult) (pediatric): Secondary | ICD-10-CM | POA: Diagnosis not present

## 2016-03-21 DIAGNOSIS — J9611 Chronic respiratory failure with hypoxia: Secondary | ICD-10-CM | POA: Diagnosis not present

## 2016-03-21 DIAGNOSIS — I5042 Chronic combined systolic (congestive) and diastolic (congestive) heart failure: Secondary | ICD-10-CM | POA: Diagnosis not present

## 2016-03-21 DIAGNOSIS — Z7901 Long term (current) use of anticoagulants: Secondary | ICD-10-CM | POA: Diagnosis not present

## 2016-03-21 DIAGNOSIS — L03115 Cellulitis of right lower limb: Secondary | ICD-10-CM | POA: Diagnosis not present

## 2016-03-21 DIAGNOSIS — Z5181 Encounter for therapeutic drug level monitoring: Secondary | ICD-10-CM | POA: Diagnosis not present

## 2016-03-22 DIAGNOSIS — L03115 Cellulitis of right lower limb: Secondary | ICD-10-CM | POA: Diagnosis not present

## 2016-03-22 DIAGNOSIS — G4733 Obstructive sleep apnea (adult) (pediatric): Secondary | ICD-10-CM | POA: Diagnosis not present

## 2016-03-22 DIAGNOSIS — S31103D Unspecified open wound of abdominal wall, right lower quadrant without penetration into peritoneal cavity, subsequent encounter: Secondary | ICD-10-CM | POA: Diagnosis not present

## 2016-03-22 DIAGNOSIS — I5042 Chronic combined systolic (congestive) and diastolic (congestive) heart failure: Secondary | ICD-10-CM | POA: Diagnosis not present

## 2016-03-22 DIAGNOSIS — I130 Hypertensive heart and chronic kidney disease with heart failure and stage 1 through stage 4 chronic kidney disease, or unspecified chronic kidney disease: Secondary | ICD-10-CM | POA: Diagnosis not present

## 2016-03-22 DIAGNOSIS — N182 Chronic kidney disease, stage 2 (mild): Secondary | ICD-10-CM | POA: Diagnosis not present

## 2016-03-22 DIAGNOSIS — J9611 Chronic respiratory failure with hypoxia: Secondary | ICD-10-CM | POA: Diagnosis not present

## 2016-03-22 DIAGNOSIS — I482 Chronic atrial fibrillation: Secondary | ICD-10-CM | POA: Diagnosis not present

## 2016-03-22 DIAGNOSIS — Z4800 Encounter for change or removal of nonsurgical wound dressing: Secondary | ICD-10-CM | POA: Diagnosis not present

## 2016-03-22 DIAGNOSIS — Z7901 Long term (current) use of anticoagulants: Secondary | ICD-10-CM | POA: Diagnosis not present

## 2016-03-22 DIAGNOSIS — Z5181 Encounter for therapeutic drug level monitoring: Secondary | ICD-10-CM | POA: Diagnosis not present

## 2016-03-22 DIAGNOSIS — E1122 Type 2 diabetes mellitus with diabetic chronic kidney disease: Secondary | ICD-10-CM | POA: Diagnosis not present

## 2016-03-25 DIAGNOSIS — Z5181 Encounter for therapeutic drug level monitoring: Secondary | ICD-10-CM | POA: Diagnosis not present

## 2016-03-25 DIAGNOSIS — L03115 Cellulitis of right lower limb: Secondary | ICD-10-CM | POA: Diagnosis not present

## 2016-03-25 DIAGNOSIS — I5042 Chronic combined systolic (congestive) and diastolic (congestive) heart failure: Secondary | ICD-10-CM | POA: Diagnosis not present

## 2016-03-25 DIAGNOSIS — N182 Chronic kidney disease, stage 2 (mild): Secondary | ICD-10-CM | POA: Diagnosis not present

## 2016-03-25 DIAGNOSIS — G4733 Obstructive sleep apnea (adult) (pediatric): Secondary | ICD-10-CM | POA: Diagnosis not present

## 2016-03-25 DIAGNOSIS — Z4800 Encounter for change or removal of nonsurgical wound dressing: Secondary | ICD-10-CM | POA: Diagnosis not present

## 2016-03-25 DIAGNOSIS — S31103D Unspecified open wound of abdominal wall, right lower quadrant without penetration into peritoneal cavity, subsequent encounter: Secondary | ICD-10-CM | POA: Diagnosis not present

## 2016-03-25 DIAGNOSIS — I482 Chronic atrial fibrillation: Secondary | ICD-10-CM | POA: Diagnosis not present

## 2016-03-25 DIAGNOSIS — E1122 Type 2 diabetes mellitus with diabetic chronic kidney disease: Secondary | ICD-10-CM | POA: Diagnosis not present

## 2016-03-25 DIAGNOSIS — J9611 Chronic respiratory failure with hypoxia: Secondary | ICD-10-CM | POA: Diagnosis not present

## 2016-03-25 DIAGNOSIS — Z7901 Long term (current) use of anticoagulants: Secondary | ICD-10-CM | POA: Diagnosis not present

## 2016-03-25 DIAGNOSIS — I130 Hypertensive heart and chronic kidney disease with heart failure and stage 1 through stage 4 chronic kidney disease, or unspecified chronic kidney disease: Secondary | ICD-10-CM | POA: Diagnosis not present

## 2016-03-26 DIAGNOSIS — N182 Chronic kidney disease, stage 2 (mild): Secondary | ICD-10-CM | POA: Diagnosis not present

## 2016-03-26 DIAGNOSIS — J9611 Chronic respiratory failure with hypoxia: Secondary | ICD-10-CM | POA: Diagnosis not present

## 2016-03-26 DIAGNOSIS — G4733 Obstructive sleep apnea (adult) (pediatric): Secondary | ICD-10-CM | POA: Diagnosis not present

## 2016-03-26 DIAGNOSIS — E1122 Type 2 diabetes mellitus with diabetic chronic kidney disease: Secondary | ICD-10-CM | POA: Diagnosis not present

## 2016-03-26 DIAGNOSIS — S31103D Unspecified open wound of abdominal wall, right lower quadrant without penetration into peritoneal cavity, subsequent encounter: Secondary | ICD-10-CM | POA: Diagnosis not present

## 2016-03-26 DIAGNOSIS — Z5181 Encounter for therapeutic drug level monitoring: Secondary | ICD-10-CM | POA: Diagnosis not present

## 2016-03-26 DIAGNOSIS — I130 Hypertensive heart and chronic kidney disease with heart failure and stage 1 through stage 4 chronic kidney disease, or unspecified chronic kidney disease: Secondary | ICD-10-CM | POA: Diagnosis not present

## 2016-03-26 DIAGNOSIS — I5042 Chronic combined systolic (congestive) and diastolic (congestive) heart failure: Secondary | ICD-10-CM | POA: Diagnosis not present

## 2016-03-26 DIAGNOSIS — Z4800 Encounter for change or removal of nonsurgical wound dressing: Secondary | ICD-10-CM | POA: Diagnosis not present

## 2016-03-26 DIAGNOSIS — Z7901 Long term (current) use of anticoagulants: Secondary | ICD-10-CM | POA: Diagnosis not present

## 2016-03-26 DIAGNOSIS — I482 Chronic atrial fibrillation: Secondary | ICD-10-CM | POA: Diagnosis not present

## 2016-03-26 DIAGNOSIS — L03115 Cellulitis of right lower limb: Secondary | ICD-10-CM | POA: Diagnosis not present

## 2016-03-29 ENCOUNTER — Other Ambulatory Visit: Payer: Self-pay | Admitting: Internal Medicine

## 2016-03-29 DIAGNOSIS — S31103D Unspecified open wound of abdominal wall, right lower quadrant without penetration into peritoneal cavity, subsequent encounter: Secondary | ICD-10-CM | POA: Diagnosis not present

## 2016-03-29 DIAGNOSIS — Z7901 Long term (current) use of anticoagulants: Secondary | ICD-10-CM | POA: Diagnosis not present

## 2016-03-29 DIAGNOSIS — G4733 Obstructive sleep apnea (adult) (pediatric): Secondary | ICD-10-CM | POA: Diagnosis not present

## 2016-03-29 DIAGNOSIS — N182 Chronic kidney disease, stage 2 (mild): Secondary | ICD-10-CM | POA: Diagnosis not present

## 2016-03-29 DIAGNOSIS — Z4800 Encounter for change or removal of nonsurgical wound dressing: Secondary | ICD-10-CM | POA: Diagnosis not present

## 2016-03-29 DIAGNOSIS — J9611 Chronic respiratory failure with hypoxia: Secondary | ICD-10-CM | POA: Diagnosis not present

## 2016-03-29 DIAGNOSIS — Z5181 Encounter for therapeutic drug level monitoring: Secondary | ICD-10-CM | POA: Diagnosis not present

## 2016-03-29 DIAGNOSIS — E1122 Type 2 diabetes mellitus with diabetic chronic kidney disease: Secondary | ICD-10-CM | POA: Diagnosis not present

## 2016-03-29 DIAGNOSIS — L03115 Cellulitis of right lower limb: Secondary | ICD-10-CM | POA: Diagnosis not present

## 2016-03-29 DIAGNOSIS — I482 Chronic atrial fibrillation: Secondary | ICD-10-CM | POA: Diagnosis not present

## 2016-03-29 DIAGNOSIS — I130 Hypertensive heart and chronic kidney disease with heart failure and stage 1 through stage 4 chronic kidney disease, or unspecified chronic kidney disease: Secondary | ICD-10-CM | POA: Diagnosis not present

## 2016-03-29 DIAGNOSIS — I5042 Chronic combined systolic (congestive) and diastolic (congestive) heart failure: Secondary | ICD-10-CM | POA: Diagnosis not present

## 2016-04-01 DIAGNOSIS — I482 Chronic atrial fibrillation: Secondary | ICD-10-CM | POA: Diagnosis not present

## 2016-04-01 DIAGNOSIS — J9611 Chronic respiratory failure with hypoxia: Secondary | ICD-10-CM | POA: Diagnosis not present

## 2016-04-01 DIAGNOSIS — I5042 Chronic combined systolic (congestive) and diastolic (congestive) heart failure: Secondary | ICD-10-CM | POA: Diagnosis not present

## 2016-04-01 DIAGNOSIS — Z7901 Long term (current) use of anticoagulants: Secondary | ICD-10-CM | POA: Diagnosis not present

## 2016-04-01 DIAGNOSIS — S31103D Unspecified open wound of abdominal wall, right lower quadrant without penetration into peritoneal cavity, subsequent encounter: Secondary | ICD-10-CM | POA: Diagnosis not present

## 2016-04-01 DIAGNOSIS — Z5181 Encounter for therapeutic drug level monitoring: Secondary | ICD-10-CM | POA: Diagnosis not present

## 2016-04-01 DIAGNOSIS — N182 Chronic kidney disease, stage 2 (mild): Secondary | ICD-10-CM | POA: Diagnosis not present

## 2016-04-01 DIAGNOSIS — Z4800 Encounter for change or removal of nonsurgical wound dressing: Secondary | ICD-10-CM | POA: Diagnosis not present

## 2016-04-01 DIAGNOSIS — I130 Hypertensive heart and chronic kidney disease with heart failure and stage 1 through stage 4 chronic kidney disease, or unspecified chronic kidney disease: Secondary | ICD-10-CM | POA: Diagnosis not present

## 2016-04-01 DIAGNOSIS — G4733 Obstructive sleep apnea (adult) (pediatric): Secondary | ICD-10-CM | POA: Diagnosis not present

## 2016-04-01 DIAGNOSIS — L03115 Cellulitis of right lower limb: Secondary | ICD-10-CM | POA: Diagnosis not present

## 2016-04-01 DIAGNOSIS — E1122 Type 2 diabetes mellitus with diabetic chronic kidney disease: Secondary | ICD-10-CM | POA: Diagnosis not present

## 2016-04-02 DIAGNOSIS — G4733 Obstructive sleep apnea (adult) (pediatric): Secondary | ICD-10-CM | POA: Diagnosis not present

## 2016-04-02 DIAGNOSIS — I130 Hypertensive heart and chronic kidney disease with heart failure and stage 1 through stage 4 chronic kidney disease, or unspecified chronic kidney disease: Secondary | ICD-10-CM | POA: Diagnosis not present

## 2016-04-02 DIAGNOSIS — N182 Chronic kidney disease, stage 2 (mild): Secondary | ICD-10-CM | POA: Diagnosis not present

## 2016-04-02 DIAGNOSIS — Z7901 Long term (current) use of anticoagulants: Secondary | ICD-10-CM | POA: Diagnosis not present

## 2016-04-02 DIAGNOSIS — S31103D Unspecified open wound of abdominal wall, right lower quadrant without penetration into peritoneal cavity, subsequent encounter: Secondary | ICD-10-CM | POA: Diagnosis not present

## 2016-04-02 DIAGNOSIS — Z5181 Encounter for therapeutic drug level monitoring: Secondary | ICD-10-CM | POA: Diagnosis not present

## 2016-04-02 DIAGNOSIS — Z4800 Encounter for change or removal of nonsurgical wound dressing: Secondary | ICD-10-CM | POA: Diagnosis not present

## 2016-04-02 DIAGNOSIS — E1122 Type 2 diabetes mellitus with diabetic chronic kidney disease: Secondary | ICD-10-CM | POA: Diagnosis not present

## 2016-04-02 DIAGNOSIS — I5042 Chronic combined systolic (congestive) and diastolic (congestive) heart failure: Secondary | ICD-10-CM | POA: Diagnosis not present

## 2016-04-02 DIAGNOSIS — J9611 Chronic respiratory failure with hypoxia: Secondary | ICD-10-CM | POA: Diagnosis not present

## 2016-04-02 DIAGNOSIS — I482 Chronic atrial fibrillation: Secondary | ICD-10-CM | POA: Diagnosis not present

## 2016-04-02 DIAGNOSIS — L03115 Cellulitis of right lower limb: Secondary | ICD-10-CM | POA: Diagnosis not present

## 2016-04-03 DIAGNOSIS — S31103D Unspecified open wound of abdominal wall, right lower quadrant without penetration into peritoneal cavity, subsequent encounter: Secondary | ICD-10-CM | POA: Diagnosis not present

## 2016-04-03 DIAGNOSIS — N182 Chronic kidney disease, stage 2 (mild): Secondary | ICD-10-CM | POA: Diagnosis not present

## 2016-04-03 DIAGNOSIS — E1122 Type 2 diabetes mellitus with diabetic chronic kidney disease: Secondary | ICD-10-CM | POA: Diagnosis not present

## 2016-04-03 DIAGNOSIS — J9611 Chronic respiratory failure with hypoxia: Secondary | ICD-10-CM | POA: Diagnosis not present

## 2016-04-03 DIAGNOSIS — Z7901 Long term (current) use of anticoagulants: Secondary | ICD-10-CM | POA: Diagnosis not present

## 2016-04-03 DIAGNOSIS — I130 Hypertensive heart and chronic kidney disease with heart failure and stage 1 through stage 4 chronic kidney disease, or unspecified chronic kidney disease: Secondary | ICD-10-CM | POA: Diagnosis not present

## 2016-04-03 DIAGNOSIS — L03115 Cellulitis of right lower limb: Secondary | ICD-10-CM | POA: Diagnosis not present

## 2016-04-03 DIAGNOSIS — Z5181 Encounter for therapeutic drug level monitoring: Secondary | ICD-10-CM | POA: Diagnosis not present

## 2016-04-03 DIAGNOSIS — G4733 Obstructive sleep apnea (adult) (pediatric): Secondary | ICD-10-CM | POA: Diagnosis not present

## 2016-04-03 DIAGNOSIS — Z4800 Encounter for change or removal of nonsurgical wound dressing: Secondary | ICD-10-CM | POA: Diagnosis not present

## 2016-04-03 DIAGNOSIS — I5042 Chronic combined systolic (congestive) and diastolic (congestive) heart failure: Secondary | ICD-10-CM | POA: Diagnosis not present

## 2016-04-03 DIAGNOSIS — I482 Chronic atrial fibrillation: Secondary | ICD-10-CM | POA: Diagnosis not present

## 2016-04-05 DIAGNOSIS — E1122 Type 2 diabetes mellitus with diabetic chronic kidney disease: Secondary | ICD-10-CM | POA: Diagnosis not present

## 2016-04-05 DIAGNOSIS — Z4800 Encounter for change or removal of nonsurgical wound dressing: Secondary | ICD-10-CM | POA: Diagnosis not present

## 2016-04-05 DIAGNOSIS — G4733 Obstructive sleep apnea (adult) (pediatric): Secondary | ICD-10-CM | POA: Diagnosis not present

## 2016-04-05 DIAGNOSIS — S31103D Unspecified open wound of abdominal wall, right lower quadrant without penetration into peritoneal cavity, subsequent encounter: Secondary | ICD-10-CM | POA: Diagnosis not present

## 2016-04-05 DIAGNOSIS — Z7901 Long term (current) use of anticoagulants: Secondary | ICD-10-CM | POA: Diagnosis not present

## 2016-04-05 DIAGNOSIS — I482 Chronic atrial fibrillation: Secondary | ICD-10-CM | POA: Diagnosis not present

## 2016-04-05 DIAGNOSIS — L03115 Cellulitis of right lower limb: Secondary | ICD-10-CM | POA: Diagnosis not present

## 2016-04-05 DIAGNOSIS — I5042 Chronic combined systolic (congestive) and diastolic (congestive) heart failure: Secondary | ICD-10-CM | POA: Diagnosis not present

## 2016-04-05 DIAGNOSIS — N182 Chronic kidney disease, stage 2 (mild): Secondary | ICD-10-CM | POA: Diagnosis not present

## 2016-04-05 DIAGNOSIS — Z5181 Encounter for therapeutic drug level monitoring: Secondary | ICD-10-CM | POA: Diagnosis not present

## 2016-04-05 DIAGNOSIS — J9611 Chronic respiratory failure with hypoxia: Secondary | ICD-10-CM | POA: Diagnosis not present

## 2016-04-05 DIAGNOSIS — I130 Hypertensive heart and chronic kidney disease with heart failure and stage 1 through stage 4 chronic kidney disease, or unspecified chronic kidney disease: Secondary | ICD-10-CM | POA: Diagnosis not present

## 2016-04-10 DIAGNOSIS — G4733 Obstructive sleep apnea (adult) (pediatric): Secondary | ICD-10-CM | POA: Diagnosis not present

## 2016-04-10 DIAGNOSIS — I130 Hypertensive heart and chronic kidney disease with heart failure and stage 1 through stage 4 chronic kidney disease, or unspecified chronic kidney disease: Secondary | ICD-10-CM | POA: Diagnosis not present

## 2016-04-10 DIAGNOSIS — I482 Chronic atrial fibrillation: Secondary | ICD-10-CM | POA: Diagnosis not present

## 2016-04-10 DIAGNOSIS — Z7901 Long term (current) use of anticoagulants: Secondary | ICD-10-CM | POA: Diagnosis not present

## 2016-04-10 DIAGNOSIS — I5042 Chronic combined systolic (congestive) and diastolic (congestive) heart failure: Secondary | ICD-10-CM | POA: Diagnosis not present

## 2016-04-10 DIAGNOSIS — Z4800 Encounter for change or removal of nonsurgical wound dressing: Secondary | ICD-10-CM | POA: Diagnosis not present

## 2016-04-10 DIAGNOSIS — Z5181 Encounter for therapeutic drug level monitoring: Secondary | ICD-10-CM | POA: Diagnosis not present

## 2016-04-10 DIAGNOSIS — N182 Chronic kidney disease, stage 2 (mild): Secondary | ICD-10-CM | POA: Diagnosis not present

## 2016-04-10 DIAGNOSIS — E1122 Type 2 diabetes mellitus with diabetic chronic kidney disease: Secondary | ICD-10-CM | POA: Diagnosis not present

## 2016-04-10 DIAGNOSIS — S31103D Unspecified open wound of abdominal wall, right lower quadrant without penetration into peritoneal cavity, subsequent encounter: Secondary | ICD-10-CM | POA: Diagnosis not present

## 2016-04-10 DIAGNOSIS — J9611 Chronic respiratory failure with hypoxia: Secondary | ICD-10-CM | POA: Diagnosis not present

## 2016-04-10 DIAGNOSIS — L03115 Cellulitis of right lower limb: Secondary | ICD-10-CM | POA: Diagnosis not present

## 2016-04-12 DIAGNOSIS — I482 Chronic atrial fibrillation: Secondary | ICD-10-CM | POA: Diagnosis not present

## 2016-04-12 DIAGNOSIS — E1122 Type 2 diabetes mellitus with diabetic chronic kidney disease: Secondary | ICD-10-CM | POA: Diagnosis not present

## 2016-04-12 DIAGNOSIS — Z4800 Encounter for change or removal of nonsurgical wound dressing: Secondary | ICD-10-CM | POA: Diagnosis not present

## 2016-04-12 DIAGNOSIS — I130 Hypertensive heart and chronic kidney disease with heart failure and stage 1 through stage 4 chronic kidney disease, or unspecified chronic kidney disease: Secondary | ICD-10-CM | POA: Diagnosis not present

## 2016-04-12 DIAGNOSIS — I5042 Chronic combined systolic (congestive) and diastolic (congestive) heart failure: Secondary | ICD-10-CM | POA: Diagnosis not present

## 2016-04-12 DIAGNOSIS — E161 Other hypoglycemia: Secondary | ICD-10-CM | POA: Diagnosis not present

## 2016-04-12 DIAGNOSIS — S31103D Unspecified open wound of abdominal wall, right lower quadrant without penetration into peritoneal cavity, subsequent encounter: Secondary | ICD-10-CM | POA: Diagnosis not present

## 2016-04-12 DIAGNOSIS — E162 Hypoglycemia, unspecified: Secondary | ICD-10-CM | POA: Diagnosis not present

## 2016-04-12 DIAGNOSIS — N182 Chronic kidney disease, stage 2 (mild): Secondary | ICD-10-CM | POA: Diagnosis not present

## 2016-04-12 DIAGNOSIS — L03115 Cellulitis of right lower limb: Secondary | ICD-10-CM | POA: Diagnosis not present

## 2016-04-12 DIAGNOSIS — G4733 Obstructive sleep apnea (adult) (pediatric): Secondary | ICD-10-CM | POA: Diagnosis not present

## 2016-04-12 DIAGNOSIS — Z7901 Long term (current) use of anticoagulants: Secondary | ICD-10-CM | POA: Diagnosis not present

## 2016-04-12 DIAGNOSIS — Z5181 Encounter for therapeutic drug level monitoring: Secondary | ICD-10-CM | POA: Diagnosis not present

## 2016-04-12 DIAGNOSIS — J9611 Chronic respiratory failure with hypoxia: Secondary | ICD-10-CM | POA: Diagnosis not present

## 2016-04-15 DIAGNOSIS — I482 Chronic atrial fibrillation: Secondary | ICD-10-CM | POA: Diagnosis not present

## 2016-04-15 DIAGNOSIS — J9611 Chronic respiratory failure with hypoxia: Secondary | ICD-10-CM | POA: Diagnosis not present

## 2016-04-15 DIAGNOSIS — I5042 Chronic combined systolic (congestive) and diastolic (congestive) heart failure: Secondary | ICD-10-CM | POA: Diagnosis not present

## 2016-04-15 DIAGNOSIS — N182 Chronic kidney disease, stage 2 (mild): Secondary | ICD-10-CM | POA: Diagnosis not present

## 2016-04-15 DIAGNOSIS — Z5181 Encounter for therapeutic drug level monitoring: Secondary | ICD-10-CM | POA: Diagnosis not present

## 2016-04-15 DIAGNOSIS — Z7901 Long term (current) use of anticoagulants: Secondary | ICD-10-CM | POA: Diagnosis not present

## 2016-04-15 DIAGNOSIS — G4733 Obstructive sleep apnea (adult) (pediatric): Secondary | ICD-10-CM | POA: Diagnosis not present

## 2016-04-15 DIAGNOSIS — I130 Hypertensive heart and chronic kidney disease with heart failure and stage 1 through stage 4 chronic kidney disease, or unspecified chronic kidney disease: Secondary | ICD-10-CM | POA: Diagnosis not present

## 2016-04-15 DIAGNOSIS — S31103D Unspecified open wound of abdominal wall, right lower quadrant without penetration into peritoneal cavity, subsequent encounter: Secondary | ICD-10-CM | POA: Diagnosis not present

## 2016-04-15 DIAGNOSIS — E1122 Type 2 diabetes mellitus with diabetic chronic kidney disease: Secondary | ICD-10-CM | POA: Diagnosis not present

## 2016-04-15 DIAGNOSIS — Z4800 Encounter for change or removal of nonsurgical wound dressing: Secondary | ICD-10-CM | POA: Diagnosis not present

## 2016-04-15 DIAGNOSIS — L03115 Cellulitis of right lower limb: Secondary | ICD-10-CM | POA: Diagnosis not present

## 2016-04-16 DIAGNOSIS — J9611 Chronic respiratory failure with hypoxia: Secondary | ICD-10-CM | POA: Diagnosis not present

## 2016-04-16 DIAGNOSIS — I482 Chronic atrial fibrillation: Secondary | ICD-10-CM | POA: Diagnosis not present

## 2016-04-16 DIAGNOSIS — Z5181 Encounter for therapeutic drug level monitoring: Secondary | ICD-10-CM | POA: Diagnosis not present

## 2016-04-16 DIAGNOSIS — I130 Hypertensive heart and chronic kidney disease with heart failure and stage 1 through stage 4 chronic kidney disease, or unspecified chronic kidney disease: Secondary | ICD-10-CM | POA: Diagnosis not present

## 2016-04-16 DIAGNOSIS — E1122 Type 2 diabetes mellitus with diabetic chronic kidney disease: Secondary | ICD-10-CM | POA: Diagnosis not present

## 2016-04-16 DIAGNOSIS — L03115 Cellulitis of right lower limb: Secondary | ICD-10-CM | POA: Diagnosis not present

## 2016-04-16 DIAGNOSIS — N182 Chronic kidney disease, stage 2 (mild): Secondary | ICD-10-CM | POA: Diagnosis not present

## 2016-04-16 DIAGNOSIS — Z7901 Long term (current) use of anticoagulants: Secondary | ICD-10-CM | POA: Diagnosis not present

## 2016-04-16 DIAGNOSIS — G4733 Obstructive sleep apnea (adult) (pediatric): Secondary | ICD-10-CM | POA: Diagnosis not present

## 2016-04-16 DIAGNOSIS — S31103D Unspecified open wound of abdominal wall, right lower quadrant without penetration into peritoneal cavity, subsequent encounter: Secondary | ICD-10-CM | POA: Diagnosis not present

## 2016-04-16 DIAGNOSIS — Z4800 Encounter for change or removal of nonsurgical wound dressing: Secondary | ICD-10-CM | POA: Diagnosis not present

## 2016-04-16 DIAGNOSIS — I5042 Chronic combined systolic (congestive) and diastolic (congestive) heart failure: Secondary | ICD-10-CM | POA: Diagnosis not present

## 2016-04-19 DIAGNOSIS — J9611 Chronic respiratory failure with hypoxia: Secondary | ICD-10-CM | POA: Diagnosis not present

## 2016-04-19 DIAGNOSIS — E1122 Type 2 diabetes mellitus with diabetic chronic kidney disease: Secondary | ICD-10-CM | POA: Diagnosis not present

## 2016-04-19 DIAGNOSIS — I130 Hypertensive heart and chronic kidney disease with heart failure and stage 1 through stage 4 chronic kidney disease, or unspecified chronic kidney disease: Secondary | ICD-10-CM | POA: Diagnosis not present

## 2016-04-19 DIAGNOSIS — Z4800 Encounter for change or removal of nonsurgical wound dressing: Secondary | ICD-10-CM | POA: Diagnosis not present

## 2016-04-19 DIAGNOSIS — N182 Chronic kidney disease, stage 2 (mild): Secondary | ICD-10-CM | POA: Diagnosis not present

## 2016-04-19 DIAGNOSIS — Z7901 Long term (current) use of anticoagulants: Secondary | ICD-10-CM | POA: Diagnosis not present

## 2016-04-19 DIAGNOSIS — I482 Chronic atrial fibrillation: Secondary | ICD-10-CM | POA: Diagnosis not present

## 2016-04-19 DIAGNOSIS — I5042 Chronic combined systolic (congestive) and diastolic (congestive) heart failure: Secondary | ICD-10-CM | POA: Diagnosis not present

## 2016-04-19 DIAGNOSIS — L03115 Cellulitis of right lower limb: Secondary | ICD-10-CM | POA: Diagnosis not present

## 2016-04-19 DIAGNOSIS — Z5181 Encounter for therapeutic drug level monitoring: Secondary | ICD-10-CM | POA: Diagnosis not present

## 2016-04-19 DIAGNOSIS — S31103D Unspecified open wound of abdominal wall, right lower quadrant without penetration into peritoneal cavity, subsequent encounter: Secondary | ICD-10-CM | POA: Diagnosis not present

## 2016-04-19 DIAGNOSIS — G4733 Obstructive sleep apnea (adult) (pediatric): Secondary | ICD-10-CM | POA: Diagnosis not present

## 2016-04-30 DIAGNOSIS — Z4800 Encounter for change or removal of nonsurgical wound dressing: Secondary | ICD-10-CM | POA: Diagnosis not present

## 2016-04-30 DIAGNOSIS — G4733 Obstructive sleep apnea (adult) (pediatric): Secondary | ICD-10-CM | POA: Diagnosis not present

## 2016-04-30 DIAGNOSIS — Z7901 Long term (current) use of anticoagulants: Secondary | ICD-10-CM | POA: Diagnosis not present

## 2016-04-30 DIAGNOSIS — I482 Chronic atrial fibrillation: Secondary | ICD-10-CM | POA: Diagnosis not present

## 2016-04-30 DIAGNOSIS — I130 Hypertensive heart and chronic kidney disease with heart failure and stage 1 through stage 4 chronic kidney disease, or unspecified chronic kidney disease: Secondary | ICD-10-CM | POA: Diagnosis not present

## 2016-04-30 DIAGNOSIS — L03115 Cellulitis of right lower limb: Secondary | ICD-10-CM | POA: Diagnosis not present

## 2016-04-30 DIAGNOSIS — Z5181 Encounter for therapeutic drug level monitoring: Secondary | ICD-10-CM | POA: Diagnosis not present

## 2016-04-30 DIAGNOSIS — S31103D Unspecified open wound of abdominal wall, right lower quadrant without penetration into peritoneal cavity, subsequent encounter: Secondary | ICD-10-CM | POA: Diagnosis not present

## 2016-04-30 DIAGNOSIS — N182 Chronic kidney disease, stage 2 (mild): Secondary | ICD-10-CM | POA: Diagnosis not present

## 2016-04-30 DIAGNOSIS — I5042 Chronic combined systolic (congestive) and diastolic (congestive) heart failure: Secondary | ICD-10-CM | POA: Diagnosis not present

## 2016-04-30 DIAGNOSIS — J9611 Chronic respiratory failure with hypoxia: Secondary | ICD-10-CM | POA: Diagnosis not present

## 2016-04-30 DIAGNOSIS — E1122 Type 2 diabetes mellitus with diabetic chronic kidney disease: Secondary | ICD-10-CM | POA: Diagnosis not present

## 2016-05-07 DIAGNOSIS — E7800 Pure hypercholesterolemia, unspecified: Secondary | ICD-10-CM | POA: Diagnosis not present

## 2016-05-07 DIAGNOSIS — I4892 Unspecified atrial flutter: Secondary | ICD-10-CM | POA: Diagnosis not present

## 2016-05-07 DIAGNOSIS — I10 Essential (primary) hypertension: Secondary | ICD-10-CM | POA: Diagnosis not present

## 2016-05-07 DIAGNOSIS — E119 Type 2 diabetes mellitus without complications: Secondary | ICD-10-CM | POA: Diagnosis not present

## 2016-05-08 DIAGNOSIS — M79651 Pain in right thigh: Secondary | ICD-10-CM | POA: Diagnosis not present

## 2016-05-08 DIAGNOSIS — M79652 Pain in left thigh: Secondary | ICD-10-CM | POA: Diagnosis not present

## 2016-05-08 DIAGNOSIS — I130 Hypertensive heart and chronic kidney disease with heart failure and stage 1 through stage 4 chronic kidney disease, or unspecified chronic kidney disease: Secondary | ICD-10-CM | POA: Diagnosis not present

## 2016-05-08 DIAGNOSIS — Z794 Long term (current) use of insulin: Secondary | ICD-10-CM | POA: Diagnosis not present

## 2016-05-08 DIAGNOSIS — N182 Chronic kidney disease, stage 2 (mild): Secondary | ICD-10-CM | POA: Diagnosis not present

## 2016-05-08 DIAGNOSIS — Z7901 Long term (current) use of anticoagulants: Secondary | ICD-10-CM | POA: Diagnosis not present

## 2016-05-08 DIAGNOSIS — M6281 Muscle weakness (generalized): Secondary | ICD-10-CM | POA: Diagnosis not present

## 2016-05-08 DIAGNOSIS — J9611 Chronic respiratory failure with hypoxia: Secondary | ICD-10-CM | POA: Diagnosis not present

## 2016-05-08 DIAGNOSIS — I5042 Chronic combined systolic (congestive) and diastolic (congestive) heart failure: Secondary | ICD-10-CM | POA: Diagnosis not present

## 2016-05-08 DIAGNOSIS — E1122 Type 2 diabetes mellitus with diabetic chronic kidney disease: Secondary | ICD-10-CM | POA: Diagnosis not present

## 2016-05-08 DIAGNOSIS — Z79891 Long term (current) use of opiate analgesic: Secondary | ICD-10-CM | POA: Diagnosis not present

## 2016-05-14 ENCOUNTER — Encounter: Payer: Self-pay | Admitting: Internal Medicine

## 2016-05-14 DIAGNOSIS — I5042 Chronic combined systolic (congestive) and diastolic (congestive) heart failure: Secondary | ICD-10-CM | POA: Diagnosis not present

## 2016-05-14 DIAGNOSIS — I130 Hypertensive heart and chronic kidney disease with heart failure and stage 1 through stage 4 chronic kidney disease, or unspecified chronic kidney disease: Secondary | ICD-10-CM | POA: Diagnosis not present

## 2016-05-14 DIAGNOSIS — M79651 Pain in right thigh: Secondary | ICD-10-CM | POA: Diagnosis not present

## 2016-05-14 DIAGNOSIS — Z79891 Long term (current) use of opiate analgesic: Secondary | ICD-10-CM | POA: Diagnosis not present

## 2016-05-14 DIAGNOSIS — E1122 Type 2 diabetes mellitus with diabetic chronic kidney disease: Secondary | ICD-10-CM | POA: Diagnosis not present

## 2016-05-14 DIAGNOSIS — Z794 Long term (current) use of insulin: Secondary | ICD-10-CM | POA: Diagnosis not present

## 2016-05-14 DIAGNOSIS — J9611 Chronic respiratory failure with hypoxia: Secondary | ICD-10-CM | POA: Diagnosis not present

## 2016-05-14 DIAGNOSIS — Z7901 Long term (current) use of anticoagulants: Secondary | ICD-10-CM | POA: Diagnosis not present

## 2016-05-14 DIAGNOSIS — M6281 Muscle weakness (generalized): Secondary | ICD-10-CM | POA: Diagnosis not present

## 2016-05-14 DIAGNOSIS — M79652 Pain in left thigh: Secondary | ICD-10-CM | POA: Diagnosis not present

## 2016-05-14 DIAGNOSIS — N182 Chronic kidney disease, stage 2 (mild): Secondary | ICD-10-CM | POA: Diagnosis not present

## 2016-05-20 ENCOUNTER — Encounter: Payer: Self-pay | Admitting: Cardiology

## 2016-05-20 DIAGNOSIS — M79651 Pain in right thigh: Secondary | ICD-10-CM | POA: Diagnosis not present

## 2016-05-20 DIAGNOSIS — J9611 Chronic respiratory failure with hypoxia: Secondary | ICD-10-CM | POA: Diagnosis not present

## 2016-05-20 DIAGNOSIS — N182 Chronic kidney disease, stage 2 (mild): Secondary | ICD-10-CM | POA: Diagnosis not present

## 2016-05-20 DIAGNOSIS — I130 Hypertensive heart and chronic kidney disease with heart failure and stage 1 through stage 4 chronic kidney disease, or unspecified chronic kidney disease: Secondary | ICD-10-CM | POA: Diagnosis not present

## 2016-05-20 DIAGNOSIS — M6281 Muscle weakness (generalized): Secondary | ICD-10-CM | POA: Diagnosis not present

## 2016-05-20 DIAGNOSIS — Z7901 Long term (current) use of anticoagulants: Secondary | ICD-10-CM | POA: Diagnosis not present

## 2016-05-20 DIAGNOSIS — E1122 Type 2 diabetes mellitus with diabetic chronic kidney disease: Secondary | ICD-10-CM | POA: Diagnosis not present

## 2016-05-20 DIAGNOSIS — I5042 Chronic combined systolic (congestive) and diastolic (congestive) heart failure: Secondary | ICD-10-CM | POA: Diagnosis not present

## 2016-05-20 DIAGNOSIS — Z794 Long term (current) use of insulin: Secondary | ICD-10-CM | POA: Diagnosis not present

## 2016-05-20 DIAGNOSIS — M79652 Pain in left thigh: Secondary | ICD-10-CM | POA: Diagnosis not present

## 2016-05-20 DIAGNOSIS — Z79891 Long term (current) use of opiate analgesic: Secondary | ICD-10-CM | POA: Diagnosis not present

## 2016-05-22 DIAGNOSIS — Z7901 Long term (current) use of anticoagulants: Secondary | ICD-10-CM | POA: Diagnosis not present

## 2016-05-22 DIAGNOSIS — N182 Chronic kidney disease, stage 2 (mild): Secondary | ICD-10-CM | POA: Diagnosis not present

## 2016-05-22 DIAGNOSIS — M79651 Pain in right thigh: Secondary | ICD-10-CM | POA: Diagnosis not present

## 2016-05-22 DIAGNOSIS — J9611 Chronic respiratory failure with hypoxia: Secondary | ICD-10-CM | POA: Diagnosis not present

## 2016-05-22 DIAGNOSIS — Z794 Long term (current) use of insulin: Secondary | ICD-10-CM | POA: Diagnosis not present

## 2016-05-22 DIAGNOSIS — I5042 Chronic combined systolic (congestive) and diastolic (congestive) heart failure: Secondary | ICD-10-CM | POA: Diagnosis not present

## 2016-05-22 DIAGNOSIS — Z79891 Long term (current) use of opiate analgesic: Secondary | ICD-10-CM | POA: Diagnosis not present

## 2016-05-22 DIAGNOSIS — E1122 Type 2 diabetes mellitus with diabetic chronic kidney disease: Secondary | ICD-10-CM | POA: Diagnosis not present

## 2016-05-22 DIAGNOSIS — I130 Hypertensive heart and chronic kidney disease with heart failure and stage 1 through stage 4 chronic kidney disease, or unspecified chronic kidney disease: Secondary | ICD-10-CM | POA: Diagnosis not present

## 2016-05-22 DIAGNOSIS — M79652 Pain in left thigh: Secondary | ICD-10-CM | POA: Diagnosis not present

## 2016-05-22 DIAGNOSIS — M6281 Muscle weakness (generalized): Secondary | ICD-10-CM | POA: Diagnosis not present

## 2016-05-27 ENCOUNTER — Encounter: Payer: Medicare Other | Admitting: Internal Medicine

## 2016-05-28 ENCOUNTER — Encounter: Payer: Self-pay | Admitting: Internal Medicine

## 2016-05-28 DIAGNOSIS — N182 Chronic kidney disease, stage 2 (mild): Secondary | ICD-10-CM | POA: Diagnosis not present

## 2016-05-28 DIAGNOSIS — I5042 Chronic combined systolic (congestive) and diastolic (congestive) heart failure: Secondary | ICD-10-CM | POA: Diagnosis not present

## 2016-05-28 DIAGNOSIS — M79651 Pain in right thigh: Secondary | ICD-10-CM | POA: Diagnosis not present

## 2016-05-28 DIAGNOSIS — J9611 Chronic respiratory failure with hypoxia: Secondary | ICD-10-CM | POA: Diagnosis not present

## 2016-05-28 DIAGNOSIS — M79652 Pain in left thigh: Secondary | ICD-10-CM | POA: Diagnosis not present

## 2016-05-28 DIAGNOSIS — M6281 Muscle weakness (generalized): Secondary | ICD-10-CM | POA: Diagnosis not present

## 2016-05-28 DIAGNOSIS — Z7901 Long term (current) use of anticoagulants: Secondary | ICD-10-CM | POA: Diagnosis not present

## 2016-05-28 DIAGNOSIS — Z794 Long term (current) use of insulin: Secondary | ICD-10-CM | POA: Diagnosis not present

## 2016-05-28 DIAGNOSIS — Z79891 Long term (current) use of opiate analgesic: Secondary | ICD-10-CM | POA: Diagnosis not present

## 2016-05-28 DIAGNOSIS — E1122 Type 2 diabetes mellitus with diabetic chronic kidney disease: Secondary | ICD-10-CM | POA: Diagnosis not present

## 2016-05-28 DIAGNOSIS — I130 Hypertensive heart and chronic kidney disease with heart failure and stage 1 through stage 4 chronic kidney disease, or unspecified chronic kidney disease: Secondary | ICD-10-CM | POA: Diagnosis not present

## 2016-05-30 DIAGNOSIS — I5042 Chronic combined systolic (congestive) and diastolic (congestive) heart failure: Secondary | ICD-10-CM | POA: Diagnosis not present

## 2016-05-30 DIAGNOSIS — M79652 Pain in left thigh: Secondary | ICD-10-CM | POA: Diagnosis not present

## 2016-05-30 DIAGNOSIS — N182 Chronic kidney disease, stage 2 (mild): Secondary | ICD-10-CM | POA: Diagnosis not present

## 2016-05-30 DIAGNOSIS — M79651 Pain in right thigh: Secondary | ICD-10-CM | POA: Diagnosis not present

## 2016-05-30 DIAGNOSIS — J9611 Chronic respiratory failure with hypoxia: Secondary | ICD-10-CM | POA: Diagnosis not present

## 2016-05-30 DIAGNOSIS — Z794 Long term (current) use of insulin: Secondary | ICD-10-CM | POA: Diagnosis not present

## 2016-05-30 DIAGNOSIS — E1122 Type 2 diabetes mellitus with diabetic chronic kidney disease: Secondary | ICD-10-CM | POA: Diagnosis not present

## 2016-05-30 DIAGNOSIS — Z7901 Long term (current) use of anticoagulants: Secondary | ICD-10-CM | POA: Diagnosis not present

## 2016-05-30 DIAGNOSIS — Z79891 Long term (current) use of opiate analgesic: Secondary | ICD-10-CM | POA: Diagnosis not present

## 2016-05-30 DIAGNOSIS — M6281 Muscle weakness (generalized): Secondary | ICD-10-CM | POA: Diagnosis not present

## 2016-05-30 DIAGNOSIS — I130 Hypertensive heart and chronic kidney disease with heart failure and stage 1 through stage 4 chronic kidney disease, or unspecified chronic kidney disease: Secondary | ICD-10-CM | POA: Diagnosis not present

## 2016-06-03 ENCOUNTER — Ambulatory Visit: Payer: Medicare Other | Admitting: Cardiology

## 2016-06-05 DIAGNOSIS — N182 Chronic kidney disease, stage 2 (mild): Secondary | ICD-10-CM | POA: Diagnosis not present

## 2016-06-05 DIAGNOSIS — M79651 Pain in right thigh: Secondary | ICD-10-CM | POA: Diagnosis not present

## 2016-06-05 DIAGNOSIS — M6281 Muscle weakness (generalized): Secondary | ICD-10-CM | POA: Diagnosis not present

## 2016-06-05 DIAGNOSIS — I5042 Chronic combined systolic (congestive) and diastolic (congestive) heart failure: Secondary | ICD-10-CM | POA: Diagnosis not present

## 2016-06-05 DIAGNOSIS — Z7901 Long term (current) use of anticoagulants: Secondary | ICD-10-CM | POA: Diagnosis not present

## 2016-06-05 DIAGNOSIS — M79652 Pain in left thigh: Secondary | ICD-10-CM | POA: Diagnosis not present

## 2016-06-05 DIAGNOSIS — E1122 Type 2 diabetes mellitus with diabetic chronic kidney disease: Secondary | ICD-10-CM | POA: Diagnosis not present

## 2016-06-05 DIAGNOSIS — I130 Hypertensive heart and chronic kidney disease with heart failure and stage 1 through stage 4 chronic kidney disease, or unspecified chronic kidney disease: Secondary | ICD-10-CM | POA: Diagnosis not present

## 2016-06-05 DIAGNOSIS — Z794 Long term (current) use of insulin: Secondary | ICD-10-CM | POA: Diagnosis not present

## 2016-06-05 DIAGNOSIS — Z79891 Long term (current) use of opiate analgesic: Secondary | ICD-10-CM | POA: Diagnosis not present

## 2016-06-05 DIAGNOSIS — J9611 Chronic respiratory failure with hypoxia: Secondary | ICD-10-CM | POA: Diagnosis not present

## 2016-06-12 DIAGNOSIS — N182 Chronic kidney disease, stage 2 (mild): Secondary | ICD-10-CM | POA: Diagnosis not present

## 2016-06-12 DIAGNOSIS — M79652 Pain in left thigh: Secondary | ICD-10-CM | POA: Diagnosis not present

## 2016-06-12 DIAGNOSIS — M79651 Pain in right thigh: Secondary | ICD-10-CM | POA: Diagnosis not present

## 2016-06-12 DIAGNOSIS — M6281 Muscle weakness (generalized): Secondary | ICD-10-CM | POA: Diagnosis not present

## 2016-06-12 DIAGNOSIS — Z79891 Long term (current) use of opiate analgesic: Secondary | ICD-10-CM | POA: Diagnosis not present

## 2016-06-12 DIAGNOSIS — I5042 Chronic combined systolic (congestive) and diastolic (congestive) heart failure: Secondary | ICD-10-CM | POA: Diagnosis not present

## 2016-06-12 DIAGNOSIS — I130 Hypertensive heart and chronic kidney disease with heart failure and stage 1 through stage 4 chronic kidney disease, or unspecified chronic kidney disease: Secondary | ICD-10-CM | POA: Diagnosis not present

## 2016-06-12 DIAGNOSIS — E1122 Type 2 diabetes mellitus with diabetic chronic kidney disease: Secondary | ICD-10-CM | POA: Diagnosis not present

## 2016-06-12 DIAGNOSIS — J9611 Chronic respiratory failure with hypoxia: Secondary | ICD-10-CM | POA: Diagnosis not present

## 2016-06-12 DIAGNOSIS — Z7901 Long term (current) use of anticoagulants: Secondary | ICD-10-CM | POA: Diagnosis not present

## 2016-06-12 DIAGNOSIS — Z794 Long term (current) use of insulin: Secondary | ICD-10-CM | POA: Diagnosis not present

## 2016-06-14 ENCOUNTER — Encounter: Payer: Self-pay | Admitting: Cardiology

## 2016-06-14 DIAGNOSIS — Z794 Long term (current) use of insulin: Secondary | ICD-10-CM | POA: Diagnosis not present

## 2016-06-14 DIAGNOSIS — N182 Chronic kidney disease, stage 2 (mild): Secondary | ICD-10-CM | POA: Diagnosis not present

## 2016-06-14 DIAGNOSIS — I130 Hypertensive heart and chronic kidney disease with heart failure and stage 1 through stage 4 chronic kidney disease, or unspecified chronic kidney disease: Secondary | ICD-10-CM | POA: Diagnosis not present

## 2016-06-14 DIAGNOSIS — I5042 Chronic combined systolic (congestive) and diastolic (congestive) heart failure: Secondary | ICD-10-CM | POA: Diagnosis not present

## 2016-06-14 DIAGNOSIS — J9611 Chronic respiratory failure with hypoxia: Secondary | ICD-10-CM | POA: Diagnosis not present

## 2016-06-14 DIAGNOSIS — M79652 Pain in left thigh: Secondary | ICD-10-CM | POA: Diagnosis not present

## 2016-06-14 DIAGNOSIS — M6281 Muscle weakness (generalized): Secondary | ICD-10-CM | POA: Diagnosis not present

## 2016-06-14 DIAGNOSIS — E1122 Type 2 diabetes mellitus with diabetic chronic kidney disease: Secondary | ICD-10-CM | POA: Diagnosis not present

## 2016-06-14 DIAGNOSIS — M79651 Pain in right thigh: Secondary | ICD-10-CM | POA: Diagnosis not present

## 2016-06-14 DIAGNOSIS — Z7901 Long term (current) use of anticoagulants: Secondary | ICD-10-CM | POA: Diagnosis not present

## 2016-06-14 DIAGNOSIS — Z79891 Long term (current) use of opiate analgesic: Secondary | ICD-10-CM | POA: Diagnosis not present

## 2016-06-17 DIAGNOSIS — R600 Localized edema: Secondary | ICD-10-CM | POA: Diagnosis not present

## 2016-06-27 DIAGNOSIS — Z23 Encounter for immunization: Secondary | ICD-10-CM | POA: Diagnosis not present

## 2016-06-27 DIAGNOSIS — R300 Dysuria: Secondary | ICD-10-CM | POA: Diagnosis not present

## 2016-07-01 DIAGNOSIS — E119 Type 2 diabetes mellitus without complications: Secondary | ICD-10-CM | POA: Diagnosis not present

## 2016-07-01 DIAGNOSIS — I10 Essential (primary) hypertension: Secondary | ICD-10-CM | POA: Diagnosis not present

## 2016-07-01 DIAGNOSIS — K469 Unspecified abdominal hernia without obstruction or gangrene: Secondary | ICD-10-CM | POA: Diagnosis not present

## 2016-07-01 DIAGNOSIS — E7800 Pure hypercholesterolemia, unspecified: Secondary | ICD-10-CM | POA: Diagnosis not present

## 2016-07-04 DIAGNOSIS — E161 Other hypoglycemia: Secondary | ICD-10-CM | POA: Diagnosis not present

## 2016-07-04 DIAGNOSIS — E162 Hypoglycemia, unspecified: Secondary | ICD-10-CM | POA: Diagnosis not present

## 2016-07-10 DIAGNOSIS — M79604 Pain in right leg: Secondary | ICD-10-CM | POA: Diagnosis not present

## 2016-07-10 DIAGNOSIS — R600 Localized edema: Secondary | ICD-10-CM | POA: Diagnosis not present

## 2016-07-10 DIAGNOSIS — M79605 Pain in left leg: Secondary | ICD-10-CM | POA: Diagnosis not present

## 2016-07-22 DIAGNOSIS — E1042 Type 1 diabetes mellitus with diabetic polyneuropathy: Secondary | ICD-10-CM | POA: Diagnosis not present

## 2016-07-22 DIAGNOSIS — M5442 Lumbago with sciatica, left side: Secondary | ICD-10-CM | POA: Diagnosis not present

## 2016-07-22 DIAGNOSIS — I110 Hypertensive heart disease with heart failure: Secondary | ICD-10-CM | POA: Diagnosis not present

## 2016-07-22 DIAGNOSIS — H35039 Hypertensive retinopathy, unspecified eye: Secondary | ICD-10-CM | POA: Diagnosis not present

## 2016-07-22 DIAGNOSIS — M5441 Lumbago with sciatica, right side: Secondary | ICD-10-CM | POA: Diagnosis not present

## 2016-07-22 DIAGNOSIS — I5032 Chronic diastolic (congestive) heart failure: Secondary | ICD-10-CM | POA: Diagnosis not present

## 2016-07-22 DIAGNOSIS — Z7901 Long term (current) use of anticoagulants: Secondary | ICD-10-CM | POA: Diagnosis not present

## 2016-07-24 DIAGNOSIS — M5441 Lumbago with sciatica, right side: Secondary | ICD-10-CM | POA: Diagnosis not present

## 2016-07-24 DIAGNOSIS — H35039 Hypertensive retinopathy, unspecified eye: Secondary | ICD-10-CM | POA: Diagnosis not present

## 2016-07-24 DIAGNOSIS — E1042 Type 1 diabetes mellitus with diabetic polyneuropathy: Secondary | ICD-10-CM | POA: Diagnosis not present

## 2016-07-24 DIAGNOSIS — I5032 Chronic diastolic (congestive) heart failure: Secondary | ICD-10-CM | POA: Diagnosis not present

## 2016-07-24 DIAGNOSIS — M5442 Lumbago with sciatica, left side: Secondary | ICD-10-CM | POA: Diagnosis not present

## 2016-07-24 DIAGNOSIS — I110 Hypertensive heart disease with heart failure: Secondary | ICD-10-CM | POA: Diagnosis not present

## 2016-07-24 DIAGNOSIS — Z7901 Long term (current) use of anticoagulants: Secondary | ICD-10-CM | POA: Diagnosis not present

## 2016-07-26 DIAGNOSIS — M5441 Lumbago with sciatica, right side: Secondary | ICD-10-CM | POA: Diagnosis not present

## 2016-07-26 DIAGNOSIS — I5032 Chronic diastolic (congestive) heart failure: Secondary | ICD-10-CM | POA: Diagnosis not present

## 2016-07-26 DIAGNOSIS — E1042 Type 1 diabetes mellitus with diabetic polyneuropathy: Secondary | ICD-10-CM | POA: Diagnosis not present

## 2016-07-26 DIAGNOSIS — H35039 Hypertensive retinopathy, unspecified eye: Secondary | ICD-10-CM | POA: Diagnosis not present

## 2016-07-26 DIAGNOSIS — M5442 Lumbago with sciatica, left side: Secondary | ICD-10-CM | POA: Diagnosis not present

## 2016-07-26 DIAGNOSIS — I110 Hypertensive heart disease with heart failure: Secondary | ICD-10-CM | POA: Diagnosis not present

## 2016-07-26 DIAGNOSIS — Z7901 Long term (current) use of anticoagulants: Secondary | ICD-10-CM | POA: Diagnosis not present

## 2016-07-29 DIAGNOSIS — M5441 Lumbago with sciatica, right side: Secondary | ICD-10-CM | POA: Diagnosis not present

## 2016-07-29 DIAGNOSIS — M5442 Lumbago with sciatica, left side: Secondary | ICD-10-CM | POA: Diagnosis not present

## 2016-07-29 DIAGNOSIS — I5032 Chronic diastolic (congestive) heart failure: Secondary | ICD-10-CM | POA: Diagnosis not present

## 2016-07-29 DIAGNOSIS — H35039 Hypertensive retinopathy, unspecified eye: Secondary | ICD-10-CM | POA: Diagnosis not present

## 2016-07-29 DIAGNOSIS — I110 Hypertensive heart disease with heart failure: Secondary | ICD-10-CM | POA: Diagnosis not present

## 2016-07-29 DIAGNOSIS — Z7901 Long term (current) use of anticoagulants: Secondary | ICD-10-CM | POA: Diagnosis not present

## 2016-07-29 DIAGNOSIS — E1042 Type 1 diabetes mellitus with diabetic polyneuropathy: Secondary | ICD-10-CM | POA: Diagnosis not present

## 2016-07-30 DIAGNOSIS — I110 Hypertensive heart disease with heart failure: Secondary | ICD-10-CM | POA: Diagnosis not present

## 2016-07-30 DIAGNOSIS — E1042 Type 1 diabetes mellitus with diabetic polyneuropathy: Secondary | ICD-10-CM | POA: Diagnosis not present

## 2016-07-30 DIAGNOSIS — M5441 Lumbago with sciatica, right side: Secondary | ICD-10-CM | POA: Diagnosis not present

## 2016-07-30 DIAGNOSIS — M5442 Lumbago with sciatica, left side: Secondary | ICD-10-CM | POA: Diagnosis not present

## 2016-07-30 DIAGNOSIS — H35039 Hypertensive retinopathy, unspecified eye: Secondary | ICD-10-CM | POA: Diagnosis not present

## 2016-07-30 DIAGNOSIS — I5032 Chronic diastolic (congestive) heart failure: Secondary | ICD-10-CM | POA: Diagnosis not present

## 2016-07-30 DIAGNOSIS — Z7901 Long term (current) use of anticoagulants: Secondary | ICD-10-CM | POA: Diagnosis not present

## 2016-07-31 DIAGNOSIS — I5032 Chronic diastolic (congestive) heart failure: Secondary | ICD-10-CM | POA: Diagnosis not present

## 2016-07-31 DIAGNOSIS — H35039 Hypertensive retinopathy, unspecified eye: Secondary | ICD-10-CM | POA: Diagnosis not present

## 2016-07-31 DIAGNOSIS — E1042 Type 1 diabetes mellitus with diabetic polyneuropathy: Secondary | ICD-10-CM | POA: Diagnosis not present

## 2016-07-31 DIAGNOSIS — M5442 Lumbago with sciatica, left side: Secondary | ICD-10-CM | POA: Diagnosis not present

## 2016-07-31 DIAGNOSIS — Z7901 Long term (current) use of anticoagulants: Secondary | ICD-10-CM | POA: Diagnosis not present

## 2016-07-31 DIAGNOSIS — I110 Hypertensive heart disease with heart failure: Secondary | ICD-10-CM | POA: Diagnosis not present

## 2016-07-31 DIAGNOSIS — M5441 Lumbago with sciatica, right side: Secondary | ICD-10-CM | POA: Diagnosis not present

## 2016-08-01 DIAGNOSIS — M5442 Lumbago with sciatica, left side: Secondary | ICD-10-CM | POA: Diagnosis not present

## 2016-08-01 DIAGNOSIS — H35039 Hypertensive retinopathy, unspecified eye: Secondary | ICD-10-CM | POA: Diagnosis not present

## 2016-08-01 DIAGNOSIS — E1042 Type 1 diabetes mellitus with diabetic polyneuropathy: Secondary | ICD-10-CM | POA: Diagnosis not present

## 2016-08-01 DIAGNOSIS — M5441 Lumbago with sciatica, right side: Secondary | ICD-10-CM | POA: Diagnosis not present

## 2016-08-01 DIAGNOSIS — Z7901 Long term (current) use of anticoagulants: Secondary | ICD-10-CM | POA: Diagnosis not present

## 2016-08-01 DIAGNOSIS — I5032 Chronic diastolic (congestive) heart failure: Secondary | ICD-10-CM | POA: Diagnosis not present

## 2016-08-01 DIAGNOSIS — I110 Hypertensive heart disease with heart failure: Secondary | ICD-10-CM | POA: Diagnosis not present

## 2016-08-06 DIAGNOSIS — Z7901 Long term (current) use of anticoagulants: Secondary | ICD-10-CM | POA: Diagnosis not present

## 2016-08-06 DIAGNOSIS — H35039 Hypertensive retinopathy, unspecified eye: Secondary | ICD-10-CM | POA: Diagnosis not present

## 2016-08-06 DIAGNOSIS — I110 Hypertensive heart disease with heart failure: Secondary | ICD-10-CM | POA: Diagnosis not present

## 2016-08-06 DIAGNOSIS — E1042 Type 1 diabetes mellitus with diabetic polyneuropathy: Secondary | ICD-10-CM | POA: Diagnosis not present

## 2016-08-06 DIAGNOSIS — I5032 Chronic diastolic (congestive) heart failure: Secondary | ICD-10-CM | POA: Diagnosis not present

## 2016-08-06 DIAGNOSIS — M5442 Lumbago with sciatica, left side: Secondary | ICD-10-CM | POA: Diagnosis not present

## 2016-08-06 DIAGNOSIS — M5441 Lumbago with sciatica, right side: Secondary | ICD-10-CM | POA: Diagnosis not present

## 2016-08-07 DIAGNOSIS — I10 Essential (primary) hypertension: Secondary | ICD-10-CM | POA: Diagnosis not present

## 2016-08-07 DIAGNOSIS — E7800 Pure hypercholesterolemia, unspecified: Secondary | ICD-10-CM | POA: Diagnosis not present

## 2016-08-07 DIAGNOSIS — E119 Type 2 diabetes mellitus without complications: Secondary | ICD-10-CM | POA: Diagnosis not present

## 2016-08-07 DIAGNOSIS — Z125 Encounter for screening for malignant neoplasm of prostate: Secondary | ICD-10-CM | POA: Diagnosis not present

## 2016-08-07 DIAGNOSIS — I4892 Unspecified atrial flutter: Secondary | ICD-10-CM | POA: Diagnosis not present

## 2016-08-08 DIAGNOSIS — I110 Hypertensive heart disease with heart failure: Secondary | ICD-10-CM | POA: Diagnosis not present

## 2016-08-08 DIAGNOSIS — H35039 Hypertensive retinopathy, unspecified eye: Secondary | ICD-10-CM | POA: Diagnosis not present

## 2016-08-08 DIAGNOSIS — E1042 Type 1 diabetes mellitus with diabetic polyneuropathy: Secondary | ICD-10-CM | POA: Diagnosis not present

## 2016-08-08 DIAGNOSIS — Z7901 Long term (current) use of anticoagulants: Secondary | ICD-10-CM | POA: Diagnosis not present

## 2016-08-08 DIAGNOSIS — I5032 Chronic diastolic (congestive) heart failure: Secondary | ICD-10-CM | POA: Diagnosis not present

## 2016-08-08 DIAGNOSIS — M5441 Lumbago with sciatica, right side: Secondary | ICD-10-CM | POA: Diagnosis not present

## 2016-08-08 DIAGNOSIS — M5442 Lumbago with sciatica, left side: Secondary | ICD-10-CM | POA: Diagnosis not present

## 2016-08-09 DIAGNOSIS — E1042 Type 1 diabetes mellitus with diabetic polyneuropathy: Secondary | ICD-10-CM | POA: Diagnosis not present

## 2016-08-09 DIAGNOSIS — M5441 Lumbago with sciatica, right side: Secondary | ICD-10-CM | POA: Diagnosis not present

## 2016-08-09 DIAGNOSIS — Z7901 Long term (current) use of anticoagulants: Secondary | ICD-10-CM | POA: Diagnosis not present

## 2016-08-09 DIAGNOSIS — M5442 Lumbago with sciatica, left side: Secondary | ICD-10-CM | POA: Diagnosis not present

## 2016-08-09 DIAGNOSIS — I110 Hypertensive heart disease with heart failure: Secondary | ICD-10-CM | POA: Diagnosis not present

## 2016-08-09 DIAGNOSIS — I5032 Chronic diastolic (congestive) heart failure: Secondary | ICD-10-CM | POA: Diagnosis not present

## 2016-08-09 DIAGNOSIS — H35039 Hypertensive retinopathy, unspecified eye: Secondary | ICD-10-CM | POA: Diagnosis not present

## 2016-08-12 DIAGNOSIS — I110 Hypertensive heart disease with heart failure: Secondary | ICD-10-CM | POA: Diagnosis not present

## 2016-08-12 DIAGNOSIS — Z7901 Long term (current) use of anticoagulants: Secondary | ICD-10-CM | POA: Diagnosis not present

## 2016-08-12 DIAGNOSIS — M5442 Lumbago with sciatica, left side: Secondary | ICD-10-CM | POA: Diagnosis not present

## 2016-08-12 DIAGNOSIS — M5441 Lumbago with sciatica, right side: Secondary | ICD-10-CM | POA: Diagnosis not present

## 2016-08-12 DIAGNOSIS — I5032 Chronic diastolic (congestive) heart failure: Secondary | ICD-10-CM | POA: Diagnosis not present

## 2016-08-12 DIAGNOSIS — E1042 Type 1 diabetes mellitus with diabetic polyneuropathy: Secondary | ICD-10-CM | POA: Diagnosis not present

## 2016-08-12 DIAGNOSIS — H35039 Hypertensive retinopathy, unspecified eye: Secondary | ICD-10-CM | POA: Diagnosis not present

## 2016-08-13 DIAGNOSIS — I110 Hypertensive heart disease with heart failure: Secondary | ICD-10-CM | POA: Diagnosis not present

## 2016-08-13 DIAGNOSIS — I5032 Chronic diastolic (congestive) heart failure: Secondary | ICD-10-CM | POA: Diagnosis not present

## 2016-08-13 DIAGNOSIS — H35039 Hypertensive retinopathy, unspecified eye: Secondary | ICD-10-CM | POA: Diagnosis not present

## 2016-08-13 DIAGNOSIS — M5441 Lumbago with sciatica, right side: Secondary | ICD-10-CM | POA: Diagnosis not present

## 2016-08-13 DIAGNOSIS — E1042 Type 1 diabetes mellitus with diabetic polyneuropathy: Secondary | ICD-10-CM | POA: Diagnosis not present

## 2016-08-13 DIAGNOSIS — M5442 Lumbago with sciatica, left side: Secondary | ICD-10-CM | POA: Diagnosis not present

## 2016-08-13 DIAGNOSIS — Z7901 Long term (current) use of anticoagulants: Secondary | ICD-10-CM | POA: Diagnosis not present

## 2016-08-14 DIAGNOSIS — M5441 Lumbago with sciatica, right side: Secondary | ICD-10-CM | POA: Diagnosis not present

## 2016-08-14 DIAGNOSIS — M5442 Lumbago with sciatica, left side: Secondary | ICD-10-CM | POA: Diagnosis not present

## 2016-08-14 DIAGNOSIS — H35039 Hypertensive retinopathy, unspecified eye: Secondary | ICD-10-CM | POA: Diagnosis not present

## 2016-08-14 DIAGNOSIS — I5032 Chronic diastolic (congestive) heart failure: Secondary | ICD-10-CM | POA: Diagnosis not present

## 2016-08-14 DIAGNOSIS — Z7901 Long term (current) use of anticoagulants: Secondary | ICD-10-CM | POA: Diagnosis not present

## 2016-08-14 DIAGNOSIS — E1042 Type 1 diabetes mellitus with diabetic polyneuropathy: Secondary | ICD-10-CM | POA: Diagnosis not present

## 2016-08-14 DIAGNOSIS — I110 Hypertensive heart disease with heart failure: Secondary | ICD-10-CM | POA: Diagnosis not present

## 2016-08-15 DIAGNOSIS — M5441 Lumbago with sciatica, right side: Secondary | ICD-10-CM | POA: Diagnosis not present

## 2016-08-15 DIAGNOSIS — H35039 Hypertensive retinopathy, unspecified eye: Secondary | ICD-10-CM | POA: Diagnosis not present

## 2016-08-15 DIAGNOSIS — I5032 Chronic diastolic (congestive) heart failure: Secondary | ICD-10-CM | POA: Diagnosis not present

## 2016-08-15 DIAGNOSIS — I110 Hypertensive heart disease with heart failure: Secondary | ICD-10-CM | POA: Diagnosis not present

## 2016-08-15 DIAGNOSIS — Z7901 Long term (current) use of anticoagulants: Secondary | ICD-10-CM | POA: Diagnosis not present

## 2016-08-15 DIAGNOSIS — E1042 Type 1 diabetes mellitus with diabetic polyneuropathy: Secondary | ICD-10-CM | POA: Diagnosis not present

## 2016-08-15 DIAGNOSIS — M5442 Lumbago with sciatica, left side: Secondary | ICD-10-CM | POA: Diagnosis not present

## 2016-08-16 DIAGNOSIS — H35039 Hypertensive retinopathy, unspecified eye: Secondary | ICD-10-CM | POA: Diagnosis not present

## 2016-08-16 DIAGNOSIS — I110 Hypertensive heart disease with heart failure: Secondary | ICD-10-CM | POA: Diagnosis not present

## 2016-08-16 DIAGNOSIS — Z7901 Long term (current) use of anticoagulants: Secondary | ICD-10-CM | POA: Diagnosis not present

## 2016-08-16 DIAGNOSIS — M5442 Lumbago with sciatica, left side: Secondary | ICD-10-CM | POA: Diagnosis not present

## 2016-08-16 DIAGNOSIS — E1042 Type 1 diabetes mellitus with diabetic polyneuropathy: Secondary | ICD-10-CM | POA: Diagnosis not present

## 2016-08-16 DIAGNOSIS — M5441 Lumbago with sciatica, right side: Secondary | ICD-10-CM | POA: Diagnosis not present

## 2016-08-16 DIAGNOSIS — I5032 Chronic diastolic (congestive) heart failure: Secondary | ICD-10-CM | POA: Diagnosis not present

## 2016-08-19 DIAGNOSIS — M5441 Lumbago with sciatica, right side: Secondary | ICD-10-CM | POA: Diagnosis not present

## 2016-08-19 DIAGNOSIS — Z7901 Long term (current) use of anticoagulants: Secondary | ICD-10-CM | POA: Diagnosis not present

## 2016-08-19 DIAGNOSIS — I5032 Chronic diastolic (congestive) heart failure: Secondary | ICD-10-CM | POA: Diagnosis not present

## 2016-08-19 DIAGNOSIS — E1042 Type 1 diabetes mellitus with diabetic polyneuropathy: Secondary | ICD-10-CM | POA: Diagnosis not present

## 2016-08-19 DIAGNOSIS — M5442 Lumbago with sciatica, left side: Secondary | ICD-10-CM | POA: Diagnosis not present

## 2016-08-19 DIAGNOSIS — H35039 Hypertensive retinopathy, unspecified eye: Secondary | ICD-10-CM | POA: Diagnosis not present

## 2016-08-19 DIAGNOSIS — I110 Hypertensive heart disease with heart failure: Secondary | ICD-10-CM | POA: Diagnosis not present

## 2016-08-20 DIAGNOSIS — Z7901 Long term (current) use of anticoagulants: Secondary | ICD-10-CM | POA: Diagnosis not present

## 2016-08-20 DIAGNOSIS — E1042 Type 1 diabetes mellitus with diabetic polyneuropathy: Secondary | ICD-10-CM | POA: Diagnosis not present

## 2016-08-20 DIAGNOSIS — M5442 Lumbago with sciatica, left side: Secondary | ICD-10-CM | POA: Diagnosis not present

## 2016-08-20 DIAGNOSIS — M5441 Lumbago with sciatica, right side: Secondary | ICD-10-CM | POA: Diagnosis not present

## 2016-08-20 DIAGNOSIS — H35039 Hypertensive retinopathy, unspecified eye: Secondary | ICD-10-CM | POA: Diagnosis not present

## 2016-08-20 DIAGNOSIS — I110 Hypertensive heart disease with heart failure: Secondary | ICD-10-CM | POA: Diagnosis not present

## 2016-08-20 DIAGNOSIS — I5032 Chronic diastolic (congestive) heart failure: Secondary | ICD-10-CM | POA: Diagnosis not present

## 2016-08-21 DIAGNOSIS — H35039 Hypertensive retinopathy, unspecified eye: Secondary | ICD-10-CM | POA: Diagnosis not present

## 2016-08-21 DIAGNOSIS — I5032 Chronic diastolic (congestive) heart failure: Secondary | ICD-10-CM | POA: Diagnosis not present

## 2016-08-21 DIAGNOSIS — E1042 Type 1 diabetes mellitus with diabetic polyneuropathy: Secondary | ICD-10-CM | POA: Diagnosis not present

## 2016-08-21 DIAGNOSIS — I110 Hypertensive heart disease with heart failure: Secondary | ICD-10-CM | POA: Diagnosis not present

## 2016-08-21 DIAGNOSIS — Z7901 Long term (current) use of anticoagulants: Secondary | ICD-10-CM | POA: Diagnosis not present

## 2016-08-21 DIAGNOSIS — M5441 Lumbago with sciatica, right side: Secondary | ICD-10-CM | POA: Diagnosis not present

## 2016-08-21 DIAGNOSIS — M5442 Lumbago with sciatica, left side: Secondary | ICD-10-CM | POA: Diagnosis not present

## 2016-08-23 ENCOUNTER — Other Ambulatory Visit: Payer: Self-pay

## 2016-08-23 DIAGNOSIS — I5032 Chronic diastolic (congestive) heart failure: Secondary | ICD-10-CM | POA: Diagnosis not present

## 2016-08-23 DIAGNOSIS — Z7901 Long term (current) use of anticoagulants: Secondary | ICD-10-CM | POA: Diagnosis not present

## 2016-08-23 DIAGNOSIS — I110 Hypertensive heart disease with heart failure: Secondary | ICD-10-CM | POA: Diagnosis not present

## 2016-08-23 DIAGNOSIS — E1042 Type 1 diabetes mellitus with diabetic polyneuropathy: Secondary | ICD-10-CM | POA: Diagnosis not present

## 2016-08-23 DIAGNOSIS — H35039 Hypertensive retinopathy, unspecified eye: Secondary | ICD-10-CM | POA: Diagnosis not present

## 2016-08-23 DIAGNOSIS — M5442 Lumbago with sciatica, left side: Secondary | ICD-10-CM | POA: Diagnosis not present

## 2016-08-23 DIAGNOSIS — M5441 Lumbago with sciatica, right side: Secondary | ICD-10-CM | POA: Diagnosis not present

## 2016-08-23 NOTE — Patient Outreach (Signed)
[facility]) Care Management  08/23/2016  Kurt Cross 08/24/1951 [id number]   Referral Date:  08/23/2016 Source:  Emmi Prevent - Score 11 Issue:  Diabetes, A-fib, defibrillator.  Falls (2), trouble getting in and out of the care (leg issues).  Wife assist with care as needed.   Outreach call # 1 to patient.  Patient not reached.   RN CM left HIPAA compliant voice message with name and number.  RN CM scheduled for next outreach call within one week.   Nathaneil Canary, BSN, RN, [facility] Care Management Care Management Coordinator [phone] Direct [phone] Cell [phone] Office [phone] Fax [email or web address]

## 2016-08-26 DIAGNOSIS — H35039 Hypertensive retinopathy, unspecified eye: Secondary | ICD-10-CM | POA: Diagnosis not present

## 2016-08-26 DIAGNOSIS — Z7901 Long term (current) use of anticoagulants: Secondary | ICD-10-CM | POA: Diagnosis not present

## 2016-08-26 DIAGNOSIS — M5442 Lumbago with sciatica, left side: Secondary | ICD-10-CM | POA: Diagnosis not present

## 2016-08-26 DIAGNOSIS — E1042 Type 1 diabetes mellitus with diabetic polyneuropathy: Secondary | ICD-10-CM | POA: Diagnosis not present

## 2016-08-26 DIAGNOSIS — I5032 Chronic diastolic (congestive) heart failure: Secondary | ICD-10-CM | POA: Diagnosis not present

## 2016-08-26 DIAGNOSIS — M5441 Lumbago with sciatica, right side: Secondary | ICD-10-CM | POA: Diagnosis not present

## 2016-08-26 DIAGNOSIS — I110 Hypertensive heart disease with heart failure: Secondary | ICD-10-CM | POA: Diagnosis not present

## 2016-08-27 ENCOUNTER — Ambulatory Visit: Payer: Self-pay

## 2016-08-27 DIAGNOSIS — M5442 Lumbago with sciatica, left side: Secondary | ICD-10-CM | POA: Diagnosis not present

## 2016-08-27 DIAGNOSIS — E1042 Type 1 diabetes mellitus with diabetic polyneuropathy: Secondary | ICD-10-CM | POA: Diagnosis not present

## 2016-08-27 DIAGNOSIS — I5032 Chronic diastolic (congestive) heart failure: Secondary | ICD-10-CM | POA: Diagnosis not present

## 2016-08-27 DIAGNOSIS — Z7901 Long term (current) use of anticoagulants: Secondary | ICD-10-CM | POA: Diagnosis not present

## 2016-08-27 DIAGNOSIS — M5441 Lumbago with sciatica, right side: Secondary | ICD-10-CM | POA: Diagnosis not present

## 2016-08-27 DIAGNOSIS — H35039 Hypertensive retinopathy, unspecified eye: Secondary | ICD-10-CM | POA: Diagnosis not present

## 2016-08-27 DIAGNOSIS — I110 Hypertensive heart disease with heart failure: Secondary | ICD-10-CM | POA: Diagnosis not present

## 2016-08-28 ENCOUNTER — Ambulatory Visit: Payer: Self-pay

## 2016-08-28 DIAGNOSIS — Z7901 Long term (current) use of anticoagulants: Secondary | ICD-10-CM | POA: Diagnosis not present

## 2016-08-28 DIAGNOSIS — E1042 Type 1 diabetes mellitus with diabetic polyneuropathy: Secondary | ICD-10-CM | POA: Diagnosis not present

## 2016-08-28 DIAGNOSIS — M5442 Lumbago with sciatica, left side: Secondary | ICD-10-CM | POA: Diagnosis not present

## 2016-08-28 DIAGNOSIS — M5441 Lumbago with sciatica, right side: Secondary | ICD-10-CM | POA: Diagnosis not present

## 2016-08-28 DIAGNOSIS — I110 Hypertensive heart disease with heart failure: Secondary | ICD-10-CM | POA: Diagnosis not present

## 2016-08-28 DIAGNOSIS — I5032 Chronic diastolic (congestive) heart failure: Secondary | ICD-10-CM | POA: Diagnosis not present

## 2016-08-28 DIAGNOSIS — H35039 Hypertensive retinopathy, unspecified eye: Secondary | ICD-10-CM | POA: Diagnosis not present

## 2016-08-30 DIAGNOSIS — H35039 Hypertensive retinopathy, unspecified eye: Secondary | ICD-10-CM | POA: Diagnosis not present

## 2016-08-30 DIAGNOSIS — I110 Hypertensive heart disease with heart failure: Secondary | ICD-10-CM | POA: Diagnosis not present

## 2016-08-30 DIAGNOSIS — Z7901 Long term (current) use of anticoagulants: Secondary | ICD-10-CM | POA: Diagnosis not present

## 2016-08-30 DIAGNOSIS — E1042 Type 1 diabetes mellitus with diabetic polyneuropathy: Secondary | ICD-10-CM | POA: Diagnosis not present

## 2016-08-30 DIAGNOSIS — I5032 Chronic diastolic (congestive) heart failure: Secondary | ICD-10-CM | POA: Diagnosis not present

## 2016-08-30 DIAGNOSIS — M5442 Lumbago with sciatica, left side: Secondary | ICD-10-CM | POA: Diagnosis not present

## 2016-08-30 DIAGNOSIS — M5441 Lumbago with sciatica, right side: Secondary | ICD-10-CM | POA: Diagnosis not present

## 2016-09-03 DIAGNOSIS — E1042 Type 1 diabetes mellitus with diabetic polyneuropathy: Secondary | ICD-10-CM | POA: Diagnosis not present

## 2016-09-03 DIAGNOSIS — Z7901 Long term (current) use of anticoagulants: Secondary | ICD-10-CM | POA: Diagnosis not present

## 2016-09-03 DIAGNOSIS — M5441 Lumbago with sciatica, right side: Secondary | ICD-10-CM | POA: Diagnosis not present

## 2016-09-03 DIAGNOSIS — M5442 Lumbago with sciatica, left side: Secondary | ICD-10-CM | POA: Diagnosis not present

## 2016-09-03 DIAGNOSIS — I110 Hypertensive heart disease with heart failure: Secondary | ICD-10-CM | POA: Diagnosis not present

## 2016-09-03 DIAGNOSIS — I5032 Chronic diastolic (congestive) heart failure: Secondary | ICD-10-CM | POA: Diagnosis not present

## 2016-09-03 DIAGNOSIS — H35039 Hypertensive retinopathy, unspecified eye: Secondary | ICD-10-CM | POA: Diagnosis not present

## 2016-09-04 ENCOUNTER — Other Ambulatory Visit: Payer: Self-pay

## 2016-09-04 DIAGNOSIS — H35039 Hypertensive retinopathy, unspecified eye: Secondary | ICD-10-CM | POA: Diagnosis not present

## 2016-09-04 DIAGNOSIS — M5441 Lumbago with sciatica, right side: Secondary | ICD-10-CM | POA: Diagnosis not present

## 2016-09-04 DIAGNOSIS — E1042 Type 1 diabetes mellitus with diabetic polyneuropathy: Secondary | ICD-10-CM | POA: Diagnosis not present

## 2016-09-04 DIAGNOSIS — I5032 Chronic diastolic (congestive) heart failure: Secondary | ICD-10-CM | POA: Diagnosis not present

## 2016-09-04 DIAGNOSIS — I110 Hypertensive heart disease with heart failure: Secondary | ICD-10-CM | POA: Diagnosis not present

## 2016-09-04 DIAGNOSIS — Z7901 Long term (current) use of anticoagulants: Secondary | ICD-10-CM | POA: Diagnosis not present

## 2016-09-04 DIAGNOSIS — M5442 Lumbago with sciatica, left side: Secondary | ICD-10-CM | POA: Diagnosis not present
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
## 2016-09-05 DIAGNOSIS — H35039 Hypertensive retinopathy, unspecified eye: Secondary | ICD-10-CM | POA: Diagnosis not present

## 2016-09-05 DIAGNOSIS — I110 Hypertensive heart disease with heart failure: Secondary | ICD-10-CM | POA: Diagnosis not present

## 2016-09-05 DIAGNOSIS — M5442 Lumbago with sciatica, left side: Secondary | ICD-10-CM | POA: Diagnosis not present

## 2016-09-05 DIAGNOSIS — I5032 Chronic diastolic (congestive) heart failure: Secondary | ICD-10-CM | POA: Diagnosis not present

## 2016-09-05 DIAGNOSIS — Z7901 Long term (current) use of anticoagulants: Secondary | ICD-10-CM | POA: Diagnosis not present

## 2016-09-05 DIAGNOSIS — M5441 Lumbago with sciatica, right side: Secondary | ICD-10-CM | POA: Diagnosis not present

## 2016-09-05 DIAGNOSIS — E1042 Type 1 diabetes mellitus with diabetic polyneuropathy: Secondary | ICD-10-CM | POA: Diagnosis not present

## 2016-09-06 ENCOUNTER — Other Ambulatory Visit: Payer: Self-pay | Admitting: Pharmacist

## 2016-09-06 DIAGNOSIS — M5442 Lumbago with sciatica, left side: Secondary | ICD-10-CM | POA: Diagnosis not present

## 2016-09-06 DIAGNOSIS — H35039 Hypertensive retinopathy, unspecified eye: Secondary | ICD-10-CM | POA: Diagnosis not present

## 2016-09-06 DIAGNOSIS — I5032 Chronic diastolic (congestive) heart failure: Secondary | ICD-10-CM | POA: Diagnosis not present

## 2016-09-06 DIAGNOSIS — M5441 Lumbago with sciatica, right side: Secondary | ICD-10-CM | POA: Diagnosis not present

## 2016-09-06 DIAGNOSIS — E1042 Type 1 diabetes mellitus with diabetic polyneuropathy: Secondary | ICD-10-CM | POA: Diagnosis not present

## 2016-09-06 DIAGNOSIS — I110 Hypertensive heart disease with heart failure: Secondary | ICD-10-CM | POA: Diagnosis not present

## 2016-09-06 DIAGNOSIS — Z7901 Long term (current) use of anticoagulants: Secondary | ICD-10-CM | POA: Diagnosis not present

## 2016-09-06 NOTE — Patient Outreach (Signed)
[facility]) Care Management  09/06/2016  Kurt Cross Jan 05, 1951 [id number]  Patient was referred to [place] by [place] for evaluation of patient assistance for insulin.    1248:  Placed call to patient, he verified HIPAA details.  Patient states he ran out of Novolog over a month ago and will be out of Humulin N today/this weekend.  Patient reports he thinks he is in the coverage gap.   He reports he has not discussed being out of insulin with his PCP.    Patient reports he fills prescriptions at [facility] [address] and [facility] [address] and he gave verbal permission for [facility] Pharmacist to contact his local pharmacies regarding fill history and co-pay information for his insulin.   Per [facility] [address]---patient has not filled insulin at that location and insulin was transferred to [facility] 11/2015.   Per [place] has not filled Humulin N and Humalog pens (Not Novolog) were last filled for 1 box in 02/2016.   1311:  Placed call back to patient, HIPAA verified and discussed the above information [facility] provided.  Patient states he hasn't filled insulin elsewhere but he had some "left over" he had been using that he had in his fridge.  Reminded patient insulin has expiration dates once opened.      Discussed with patient his MA-PDP, AARP/UHC covers Humalog per the preferred drug list, not Novolog.  Discussed with patient he could obtain Novolin N at [facility] in place of Humulin N for ~$25/vial---patient reports this is affordable.   [facility] Pharmacist attempt to contact PCP office, office closed at noon.    Called patient back and again verified HIPAA details.  Counseled patient his prescriber office closed at noon and suggested patient contact his prescriber given he ran out of one insulin a month ago.   Counseled patient that if he is in the coverage gap, he will pay 40% of the plan's cost of a brand name drug and he  could have [facility] transfer his Humalog prescription from [facility].  Counseled patient he can get Novolin N at [facility] for ~$25/vial.   Patient stated he would be contacting his prescriber today regarding his prescriptions.    Used the teach-back method with patient to ensure he understands Novolin N would be a therapeutic equivalent to Humulin N and he verified understanding.    Plan:  Will route this note to patient's PCP and will place follow-up call to patient next week.   Karrie Meres, PharmD, [place] [phone]

## 2016-09-09 ENCOUNTER — Other Ambulatory Visit: Payer: Self-pay | Admitting: Pharmacist

## 2016-09-09 DIAGNOSIS — M5442 Lumbago with sciatica, left side: Secondary | ICD-10-CM | POA: Diagnosis not present

## 2016-09-09 DIAGNOSIS — H35039 Hypertensive retinopathy, unspecified eye: Secondary | ICD-10-CM | POA: Diagnosis not present

## 2016-09-09 DIAGNOSIS — Z7901 Long term (current) use of anticoagulants: Secondary | ICD-10-CM | POA: Diagnosis not present

## 2016-09-09 DIAGNOSIS — I5032 Chronic diastolic (congestive) heart failure: Secondary | ICD-10-CM | POA: Diagnosis not present

## 2016-09-09 DIAGNOSIS — I110 Hypertensive heart disease with heart failure: Secondary | ICD-10-CM | POA: Diagnosis not present

## 2016-09-09 DIAGNOSIS — E1042 Type 1 diabetes mellitus with diabetic polyneuropathy: Secondary | ICD-10-CM | POA: Diagnosis not present

## 2016-09-09 DIAGNOSIS — M5441 Lumbago with sciatica, right side: Secondary | ICD-10-CM | POA: Diagnosis not present

## 2016-09-09 NOTE — Patient Outreach (Signed)
[facility]) Care Management  09/09/2016  Kurt Cross [birth year] [id number]  Follow-up phone call to patient, he verified HIPAA details.    Patient states that he obtained Reli-On Novolin N from [place] and that his PCP office provided him samples of Humalog.  He reports he can afford Novolin N at [employer] and he reports he has enough Humalog to last through the end of the year as he will have a new plan year come 10/07/16.   Patient did not wish to review his medications over the phone with [facility] Pharmacist.  He denied other pharmacy related questions or concerns.   Plan:  Will close pharmacy case.   Will update [place] via [employer].   Patient confirms he has [facility] Pharmacist phone number if he has new needs.   Karrie Meres, PharmD, [place] [phone]

## 2016-09-10 DIAGNOSIS — E1042 Type 1 diabetes mellitus with diabetic polyneuropathy: Secondary | ICD-10-CM | POA: Diagnosis not present

## 2016-09-10 DIAGNOSIS — M5442 Lumbago with sciatica, left side: Secondary | ICD-10-CM | POA: Diagnosis not present

## 2016-09-10 DIAGNOSIS — I110 Hypertensive heart disease with heart failure: Secondary | ICD-10-CM | POA: Diagnosis not present

## 2016-09-10 DIAGNOSIS — H35039 Hypertensive retinopathy, unspecified eye: Secondary | ICD-10-CM | POA: Diagnosis not present

## 2016-09-10 DIAGNOSIS — Z7901 Long term (current) use of anticoagulants: Secondary | ICD-10-CM | POA: Diagnosis not present

## 2016-09-10 DIAGNOSIS — M5441 Lumbago with sciatica, right side: Secondary | ICD-10-CM | POA: Diagnosis not present

## 2016-09-10 DIAGNOSIS — I5032 Chronic diastolic (congestive) heart failure: Secondary | ICD-10-CM | POA: Diagnosis not present

## 2016-09-12 ENCOUNTER — Encounter: Payer: Self-pay | Admitting: *Deleted

## 2016-09-12 DIAGNOSIS — H35039 Hypertensive retinopathy, unspecified eye: Secondary | ICD-10-CM | POA: Diagnosis not present

## 2016-09-12 DIAGNOSIS — I110 Hypertensive heart disease with heart failure: Secondary | ICD-10-CM | POA: Diagnosis not present

## 2016-09-12 DIAGNOSIS — I5032 Chronic diastolic (congestive) heart failure: Secondary | ICD-10-CM | POA: Diagnosis not present

## 2016-09-12 DIAGNOSIS — E1042 Type 1 diabetes mellitus with diabetic polyneuropathy: Secondary | ICD-10-CM | POA: Diagnosis not present

## 2016-09-12 DIAGNOSIS — M5441 Lumbago with sciatica, right side: Secondary | ICD-10-CM | POA: Diagnosis not present

## 2016-09-12 DIAGNOSIS — M5442 Lumbago with sciatica, left side: Secondary | ICD-10-CM | POA: Diagnosis not present

## 2016-09-12 DIAGNOSIS — Z7901 Long term (current) use of anticoagulants: Secondary | ICD-10-CM | POA: Diagnosis not present

## 2016-09-13 ENCOUNTER — Other Ambulatory Visit: Payer: Self-pay | Admitting: *Deleted

## 2016-09-13 ENCOUNTER — Encounter: Payer: Self-pay | Admitting: *Deleted

## 2016-09-13 DIAGNOSIS — I110 Hypertensive heart disease with heart failure: Secondary | ICD-10-CM | POA: Diagnosis not present

## 2016-09-13 DIAGNOSIS — E1042 Type 1 diabetes mellitus with diabetic polyneuropathy: Secondary | ICD-10-CM | POA: Diagnosis not present

## 2016-09-13 DIAGNOSIS — M5441 Lumbago with sciatica, right side: Secondary | ICD-10-CM | POA: Diagnosis not present

## 2016-09-13 DIAGNOSIS — Z7901 Long term (current) use of anticoagulants: Secondary | ICD-10-CM | POA: Diagnosis not present

## 2016-09-13 DIAGNOSIS — M5442 Lumbago with sciatica, left side: Secondary | ICD-10-CM | POA: Diagnosis not present

## 2016-09-13 DIAGNOSIS — H35039 Hypertensive retinopathy, unspecified eye: Secondary | ICD-10-CM | POA: Diagnosis not present

## 2016-09-13 DIAGNOSIS — I5032 Chronic diastolic (congestive) heart failure: Secondary | ICD-10-CM | POA: Diagnosis not present

## 2016-09-13 NOTE — Patient Outreach (Signed)
[name]) Care Management  09/13/2016  Kurt Cross Jul 07, 1951 [id number]   CSW was able to make initial contact with patient today to perform phone assessment, as well as assess and assist with social work needs and services.  CSW introduced self, explained role and types of services provided through [place] Management ([place] Management).  CSW further explained to patient that CSW works with patient's Telephonic RNCM, also with [place] Management, [name]. CSW then explained the reason for the call, indicating that [name] thought that patient would benefit from social work services and resources to assist with referrals to various community agencies and resources.  CSW obtained two HIPAA compliant identifiers from patient, which included patient's name and date of birth. Patient admits that he has difficulty getting to and from his physician appointments due to inability to drive, as a result of left pain, discomfort and weakness.  Patient is requesting transportation resources to get to and from appointments.  Patient is also interested in completing his Advanced Directives ([name]).  An Advanced Directives copy has already been mailed to patient's home by [name] on November 29th.  In the meantime, CSW will also mail patient a packet of resource information, which will include the following transportation resources: [employer] Paramedic) Network engineer for [employer] through [employer] As well as a Sport and exercise psychologist for Treatment.  CSW discussed transportation resources with patient over the phone, also inquiring about any additional social work needs identified.  CSW agreed to contact patient on December 20th to follow-up to ensure that patient received the packet of resource information that CSW mailed to his  home, as well as to answer any questions that he may have with regards to completion.  CSW will assist with the referral process, if necessary.  Patient voiced understanding and was agreeable to this plan. Kurt [place], BSW, MSW, LCSW  Licensed Education officer, environmental Health System  Mailing [place] [address] [place], [place] [address] Physical Address-[address], [place], [place] [address] Toll Free Main # [phone] Fax # [phone] Cell # [phone]  Fax # [phone]  [email or web address]

## 2016-09-17 DIAGNOSIS — Z7901 Long term (current) use of anticoagulants: Secondary | ICD-10-CM | POA: Diagnosis not present

## 2016-09-17 DIAGNOSIS — I110 Hypertensive heart disease with heart failure: Secondary | ICD-10-CM | POA: Diagnosis not present

## 2016-09-17 DIAGNOSIS — E1042 Type 1 diabetes mellitus with diabetic polyneuropathy: Secondary | ICD-10-CM | POA: Diagnosis not present

## 2016-09-17 DIAGNOSIS — M5441 Lumbago with sciatica, right side: Secondary | ICD-10-CM | POA: Diagnosis not present

## 2016-09-17 DIAGNOSIS — H35039 Hypertensive retinopathy, unspecified eye: Secondary | ICD-10-CM | POA: Diagnosis not present

## 2016-09-17 DIAGNOSIS — M5442 Lumbago with sciatica, left side: Secondary | ICD-10-CM | POA: Diagnosis not present

## 2016-09-17 DIAGNOSIS — I5032 Chronic diastolic (congestive) heart failure: Secondary | ICD-10-CM | POA: Diagnosis not present

## 2016-09-17 NOTE — Patient Outreach (Signed)
[facility]) Care Management  09/17/2016  DEUCE PATERNOSTER 12/21/1950 [id number]   Request received from [name], LCSW to mail patient transportation resources and welcome packet and consent for treatment. Information mailed.   Jacqulynn Cadet  [facility] Care Management Assistant

## 2016-09-18 DIAGNOSIS — I110 Hypertensive heart disease with heart failure: Secondary | ICD-10-CM | POA: Diagnosis not present

## 2016-09-18 DIAGNOSIS — M5441 Lumbago with sciatica, right side: Secondary | ICD-10-CM | POA: Diagnosis not present

## 2016-09-18 DIAGNOSIS — M5442 Lumbago with sciatica, left side: Secondary | ICD-10-CM | POA: Diagnosis not present

## 2016-09-18 DIAGNOSIS — I5032 Chronic diastolic (congestive) heart failure: Secondary | ICD-10-CM | POA: Diagnosis not present

## 2016-09-18 DIAGNOSIS — E1042 Type 1 diabetes mellitus with diabetic polyneuropathy: Secondary | ICD-10-CM | POA: Diagnosis not present

## 2016-09-18 DIAGNOSIS — Z7901 Long term (current) use of anticoagulants: Secondary | ICD-10-CM | POA: Diagnosis not present

## 2016-09-18 DIAGNOSIS — H35039 Hypertensive retinopathy, unspecified eye: Secondary | ICD-10-CM | POA: Diagnosis not present

## 2016-09-19 DIAGNOSIS — I5032 Chronic diastolic (congestive) heart failure: Secondary | ICD-10-CM | POA: Diagnosis not present

## 2016-09-19 DIAGNOSIS — E1042 Type 1 diabetes mellitus with diabetic polyneuropathy: Secondary | ICD-10-CM | POA: Diagnosis not present

## 2016-09-19 DIAGNOSIS — I110 Hypertensive heart disease with heart failure: Secondary | ICD-10-CM | POA: Diagnosis not present

## 2016-09-19 DIAGNOSIS — H35039 Hypertensive retinopathy, unspecified eye: Secondary | ICD-10-CM | POA: Diagnosis not present

## 2016-09-19 DIAGNOSIS — M5441 Lumbago with sciatica, right side: Secondary | ICD-10-CM | POA: Diagnosis not present

## 2016-09-19 DIAGNOSIS — M5442 Lumbago with sciatica, left side: Secondary | ICD-10-CM | POA: Diagnosis not present

## 2016-09-19 DIAGNOSIS — Z7901 Long term (current) use of anticoagulants: Secondary | ICD-10-CM | POA: Diagnosis not present

## 2016-09-19 DIAGNOSIS — N390 Urinary tract infection, site not specified: Secondary | ICD-10-CM | POA: Diagnosis not present

## 2016-09-24 DIAGNOSIS — H35039 Hypertensive retinopathy, unspecified eye: Secondary | ICD-10-CM | POA: Diagnosis not present

## 2016-09-24 DIAGNOSIS — M5442 Lumbago with sciatica, left side: Secondary | ICD-10-CM | POA: Diagnosis not present

## 2016-09-24 DIAGNOSIS — E1042 Type 1 diabetes mellitus with diabetic polyneuropathy: Secondary | ICD-10-CM | POA: Diagnosis not present

## 2016-09-24 DIAGNOSIS — Z7901 Long term (current) use of anticoagulants: Secondary | ICD-10-CM | POA: Diagnosis not present

## 2016-09-24 DIAGNOSIS — I110 Hypertensive heart disease with heart failure: Secondary | ICD-10-CM | POA: Diagnosis not present

## 2016-09-24 DIAGNOSIS — M5441 Lumbago with sciatica, right side: Secondary | ICD-10-CM | POA: Diagnosis not present

## 2016-09-24 DIAGNOSIS — I5032 Chronic diastolic (congestive) heart failure: Secondary | ICD-10-CM | POA: Diagnosis not present

## 2016-09-24 DIAGNOSIS — Z5181 Encounter for therapeutic drug level monitoring: Secondary | ICD-10-CM | POA: Diagnosis not present

## 2016-09-24 DIAGNOSIS — E10622 Type 1 diabetes mellitus with other skin ulcer: Secondary | ICD-10-CM | POA: Diagnosis not present

## 2016-09-24 DIAGNOSIS — L97211 Non-pressure chronic ulcer of right calf limited to breakdown of skin: Secondary | ICD-10-CM | POA: Diagnosis not present

## 2016-09-25 ENCOUNTER — Other Ambulatory Visit: Payer: Self-pay | Admitting: *Deleted

## 2016-09-25 NOTE — Patient Outreach (Signed)
[facility]) Care Management  09/25/2016  Kurt Cross 05-Feb-1951 [id number]   CSW was able to make contact with patient today to follow-up regarding social work services and resources, as well as ensure that patient received the packet of resource information that CSW mailed to his home.  Patient confirmed that the packet was received and that the transportation resources will be of great assistance, when traveling to and from his physician appointments.  Further, patient reported that his wife, Kurt Cross has already completed their Advanced Directives (Living Will and [place] documents) and that they plan to have them notarized by a family friend.  Patient and his wife were encouraged to provide their primary care physicians with a copy of their Advanced Directives so that they can get scanned into their charts.  Patient voiced understanding and was agreeable to this plan.  CSW was able to confirm that patient has the correct contact information for CSW. CSW will perform a case closure on patient, as all goals of treatment have been met from social work standpoint and no additional social work needs have been identified at this time.  CSW will notify patient's Telephonic RNCM with [place] Management, Kurt Cross of CSW's plans to close patient's case.  CSW will fax an update to patient's Primary Care Physician, Kurt Cross to ensure that they are aware of CSW's involvement with patient's plan of care.  CSW will submit a case closure request to Kurt Cross, Care Management Assistant with [place] Management, in the form of an [employer].  CSW will ensure that Kurt Cross is aware of Mrs. Cross's, RNCM with [place] Management, continued involvement with patient's care. Kurt Cross, BSW, MSW, LCSW  Licensed Brewing technologist Health System  Mailing [place] [address] [place], [place] [address] Physical Address-[address], [place],  [address] Toll Free Main # [phone] Fax # [phone] Cell # [phone]  Fax # [phone]  [name]

## 2016-09-26 DIAGNOSIS — I110 Hypertensive heart disease with heart failure: Secondary | ICD-10-CM | POA: Diagnosis not present

## 2016-09-26 DIAGNOSIS — H35039 Hypertensive retinopathy, unspecified eye: Secondary | ICD-10-CM | POA: Diagnosis not present

## 2016-09-26 DIAGNOSIS — I5032 Chronic diastolic (congestive) heart failure: Secondary | ICD-10-CM | POA: Diagnosis not present

## 2016-09-26 DIAGNOSIS — E10622 Type 1 diabetes mellitus with other skin ulcer: Secondary | ICD-10-CM | POA: Diagnosis not present

## 2016-09-26 DIAGNOSIS — M5442 Lumbago with sciatica, left side: Secondary | ICD-10-CM | POA: Diagnosis not present

## 2016-09-26 DIAGNOSIS — Z5181 Encounter for therapeutic drug level monitoring: Secondary | ICD-10-CM | POA: Diagnosis not present

## 2016-09-26 DIAGNOSIS — L97211 Non-pressure chronic ulcer of right calf limited to breakdown of skin: Secondary | ICD-10-CM | POA: Diagnosis not present

## 2016-09-26 DIAGNOSIS — Z7901 Long term (current) use of anticoagulants: Secondary | ICD-10-CM | POA: Diagnosis not present

## 2016-09-26 DIAGNOSIS — E1042 Type 1 diabetes mellitus with diabetic polyneuropathy: Secondary | ICD-10-CM | POA: Diagnosis not present

## 2016-09-26 DIAGNOSIS — M5441 Lumbago with sciatica, right side: Secondary | ICD-10-CM | POA: Diagnosis not present

## 2016-09-27 DIAGNOSIS — L97211 Non-pressure chronic ulcer of right calf limited to breakdown of skin: Secondary | ICD-10-CM | POA: Diagnosis not present

## 2016-09-27 DIAGNOSIS — I5032 Chronic diastolic (congestive) heart failure: Secondary | ICD-10-CM | POA: Diagnosis not present

## 2016-09-27 DIAGNOSIS — M5441 Lumbago with sciatica, right side: Secondary | ICD-10-CM | POA: Diagnosis not present

## 2016-09-27 DIAGNOSIS — M5442 Lumbago with sciatica, left side: Secondary | ICD-10-CM | POA: Diagnosis not present

## 2016-09-27 DIAGNOSIS — E10622 Type 1 diabetes mellitus with other skin ulcer: Secondary | ICD-10-CM | POA: Diagnosis not present

## 2016-09-27 DIAGNOSIS — Z7901 Long term (current) use of anticoagulants: Secondary | ICD-10-CM | POA: Diagnosis not present

## 2016-09-27 DIAGNOSIS — I110 Hypertensive heart disease with heart failure: Secondary | ICD-10-CM | POA: Diagnosis not present

## 2016-09-27 DIAGNOSIS — E1042 Type 1 diabetes mellitus with diabetic polyneuropathy: Secondary | ICD-10-CM | POA: Diagnosis not present

## 2016-09-27 DIAGNOSIS — Z5181 Encounter for therapeutic drug level monitoring: Secondary | ICD-10-CM | POA: Diagnosis not present

## 2016-09-27 DIAGNOSIS — H35039 Hypertensive retinopathy, unspecified eye: Secondary | ICD-10-CM | POA: Diagnosis not present

## 2016-10-02 ENCOUNTER — Ambulatory Visit: Payer: Self-pay

## 2016-10-02 DIAGNOSIS — L97211 Non-pressure chronic ulcer of right calf limited to breakdown of skin: Secondary | ICD-10-CM | POA: Diagnosis not present

## 2016-10-02 DIAGNOSIS — I110 Hypertensive heart disease with heart failure: Secondary | ICD-10-CM | POA: Diagnosis not present

## 2016-10-02 DIAGNOSIS — Z7901 Long term (current) use of anticoagulants: Secondary | ICD-10-CM | POA: Diagnosis not present

## 2016-10-02 DIAGNOSIS — E1042 Type 1 diabetes mellitus with diabetic polyneuropathy: Secondary | ICD-10-CM | POA: Diagnosis not present

## 2016-10-02 DIAGNOSIS — I5032 Chronic diastolic (congestive) heart failure: Secondary | ICD-10-CM | POA: Diagnosis not present

## 2016-10-02 DIAGNOSIS — M5441 Lumbago with sciatica, right side: Secondary | ICD-10-CM | POA: Diagnosis not present

## 2016-10-02 DIAGNOSIS — E10622 Type 1 diabetes mellitus with other skin ulcer: Secondary | ICD-10-CM | POA: Diagnosis not present

## 2016-10-02 DIAGNOSIS — H35039 Hypertensive retinopathy, unspecified eye: Secondary | ICD-10-CM | POA: Diagnosis not present

## 2016-10-02 DIAGNOSIS — M5442 Lumbago with sciatica, left side: Secondary | ICD-10-CM | POA: Diagnosis not present

## 2016-10-02 DIAGNOSIS — Z5181 Encounter for therapeutic drug level monitoring: Secondary | ICD-10-CM | POA: Diagnosis not present

## 2016-10-03 ENCOUNTER — Other Ambulatory Visit: Payer: Self-pay

## 2016-10-03 DIAGNOSIS — M5441 Lumbago with sciatica, right side: Secondary | ICD-10-CM | POA: Diagnosis not present

## 2016-10-03 DIAGNOSIS — L97211 Non-pressure chronic ulcer of right calf limited to breakdown of skin: Secondary | ICD-10-CM | POA: Diagnosis not present

## 2016-10-03 DIAGNOSIS — Z5181 Encounter for therapeutic drug level monitoring: Secondary | ICD-10-CM | POA: Diagnosis not present

## 2016-10-03 DIAGNOSIS — I5032 Chronic diastolic (congestive) heart failure: Secondary | ICD-10-CM | POA: Diagnosis not present

## 2016-10-03 DIAGNOSIS — M5442 Lumbago with sciatica, left side: Secondary | ICD-10-CM | POA: Diagnosis not present

## 2016-10-03 DIAGNOSIS — Z7901 Long term (current) use of anticoagulants: Secondary | ICD-10-CM | POA: Diagnosis not present

## 2016-10-03 DIAGNOSIS — H35039 Hypertensive retinopathy, unspecified eye: Secondary | ICD-10-CM | POA: Diagnosis not present

## 2016-10-03 DIAGNOSIS — E10622 Type 1 diabetes mellitus with other skin ulcer: Secondary | ICD-10-CM | POA: Diagnosis not present

## 2016-10-03 DIAGNOSIS — I110 Hypertensive heart disease with heart failure: Secondary | ICD-10-CM | POA: Diagnosis not present

## 2016-10-03 DIAGNOSIS — E1042 Type 1 diabetes mellitus with diabetic polyneuropathy: Secondary | ICD-10-CM | POA: Diagnosis not present
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
## 2016-10-03 NOTE — Patient Outreach (Signed)
[facility] [facility]) Care Management  10/03/2016  Kurt Cross 08-12-1951 [id number]    Care Coordination:    RN CM notified PCP, Dr. Maudie Mercury  via Epic fax:   -Podiatry Referral needed for Diabetic foot care.  -Flu Vaccnine:   Last documented date in MR:  Flu Vaccine 07/11/2015 but patient states he had the flu vaccine with PCP this year.   Nathaneil Canary, BSN, RN, [place] Management Care Management Coordinator [phone] Direct [phone] Cell [phone] Office [phone] Fax [email or web address]

## 2016-10-04 ENCOUNTER — Other Ambulatory Visit: Payer: Self-pay | Admitting: Pharmacist

## 2016-10-04 DIAGNOSIS — E1042 Type 1 diabetes mellitus with diabetic polyneuropathy: Secondary | ICD-10-CM | POA: Diagnosis not present

## 2016-10-04 DIAGNOSIS — L97211 Non-pressure chronic ulcer of right calf limited to breakdown of skin: Secondary | ICD-10-CM | POA: Diagnosis not present

## 2016-10-04 DIAGNOSIS — Z5181 Encounter for therapeutic drug level monitoring: Secondary | ICD-10-CM | POA: Diagnosis not present

## 2016-10-04 DIAGNOSIS — I110 Hypertensive heart disease with heart failure: Secondary | ICD-10-CM | POA: Diagnosis not present

## 2016-10-04 DIAGNOSIS — E10622 Type 1 diabetes mellitus with other skin ulcer: Secondary | ICD-10-CM | POA: Diagnosis not present

## 2016-10-04 DIAGNOSIS — M5442 Lumbago with sciatica, left side: Secondary | ICD-10-CM | POA: Diagnosis not present

## 2016-10-04 DIAGNOSIS — H35039 Hypertensive retinopathy, unspecified eye: Secondary | ICD-10-CM | POA: Diagnosis not present

## 2016-10-04 DIAGNOSIS — M5441 Lumbago with sciatica, right side: Secondary | ICD-10-CM | POA: Diagnosis not present

## 2016-10-04 DIAGNOSIS — I5032 Chronic diastolic (congestive) heart failure: Secondary | ICD-10-CM | POA: Diagnosis not present

## 2016-10-04 DIAGNOSIS — Z7901 Long term (current) use of anticoagulants: Secondary | ICD-10-CM | POA: Diagnosis not present

## 2016-10-04 NOTE — Patient Outreach (Signed)
[name]) Care Management  10/04/2016  Kurt Cross 15-Nov-1950 [id number]  Urgent referral received from [name], [name], regarding patient assistance.    1002: Phone call to patient, HIPAA verified.  Patient reports he has Novolin N insulin and ran out of Humalog "months ago."  He reports he had a sample that lasted him 6 days previously.   Earlier this month patient had stated to [name] that since switching to [employer] his insulin was affordable.   Patient was counseled on manufacturer patient assistance program requirements, including that he needs to meet an out-of-pocket spend for a calendar year to qualify---he reports he doesn't meet the out-of-pocket spend.   Patient states he has not called his PCP office to see if they have samples.   Phone call to [name], was told patient did not fill insulin, unable to determine if patient may have paid cash for Novolin N.   1015: Phone call to PCP office, Dr [name].  Left message with office and was told PCP CMA/RN would return call.   1138:  Second phone call to PCP office as PCP office closes at 1200, told CMA/RN will return call.   1150:  Call received from [name] with PCP who states PCP would provide samples if available.  Discussed that if patient is not able to qualify for patient assistance in 2018, alternative agents may need to be explored such as Reli-On Novolin R at [facility] for ~$25/vial.   1401:  Call placed to patient, no answer, HIPAA compliant message left.    Plan:  As there was no return call from patient, will attempt to outreach patient again next week.    Karrie Meres, PharmD, [place] [phone]

## 2016-10-08 ENCOUNTER — Other Ambulatory Visit: Payer: Self-pay | Admitting: Pharmacist

## 2016-10-08 NOTE — Patient Outreach (Signed)
[facility]) Care Management  10/08/2016  Kurt Cross [birth year] [id number]  Successful phone outreach to patient, HIPAA details verified.   Counseled patient that as it is a new plan year, he should no longer be in the coverage gap on is MA-PDP.  Counseled patient, if he continues to have [employer] MA-PDP Plan 1, his co-pay for a Tier 3 medication, like Humalog, appears to be $45/30 day supply until he reaches the coverage gap.    Counseled patient that if this co-pay is too high, he should discuss alternative options with his prescriber.       Reli-On Novolin N, R, 70/30 are ~$25/vial at [facility] and patient is also using Novolin N per his report.    He states his insulin should now be affordable with the new plan year.   Unfortunately, if patient does enter the coverage gap phase during 2018, options will be limited unless he meets manufacturer patient assistance program requirements.    Plan:  Will close this case as patient denies further pharmacy needs at the time and will route note to PCP.    Karrie Meres, PharmD, [place] [phone]

## 2016-10-09 DIAGNOSIS — I5032 Chronic diastolic (congestive) heart failure: Secondary | ICD-10-CM | POA: Diagnosis not present

## 2016-10-09 DIAGNOSIS — H35039 Hypertensive retinopathy, unspecified eye: Secondary | ICD-10-CM | POA: Diagnosis not present

## 2016-10-09 DIAGNOSIS — E10622 Type 1 diabetes mellitus with other skin ulcer: Secondary | ICD-10-CM | POA: Diagnosis not present

## 2016-10-09 DIAGNOSIS — Z7901 Long term (current) use of anticoagulants: Secondary | ICD-10-CM | POA: Diagnosis not present

## 2016-10-09 DIAGNOSIS — E1042 Type 1 diabetes mellitus with diabetic polyneuropathy: Secondary | ICD-10-CM | POA: Diagnosis not present

## 2016-10-09 DIAGNOSIS — M5442 Lumbago with sciatica, left side: Secondary | ICD-10-CM | POA: Diagnosis not present

## 2016-10-09 DIAGNOSIS — Z5181 Encounter for therapeutic drug level monitoring: Secondary | ICD-10-CM | POA: Diagnosis not present

## 2016-10-09 DIAGNOSIS — M5441 Lumbago with sciatica, right side: Secondary | ICD-10-CM | POA: Diagnosis not present

## 2016-10-09 DIAGNOSIS — L97211 Non-pressure chronic ulcer of right calf limited to breakdown of skin: Secondary | ICD-10-CM | POA: Diagnosis not present

## 2016-10-09 DIAGNOSIS — I110 Hypertensive heart disease with heart failure: Secondary | ICD-10-CM | POA: Diagnosis not present

## 2016-10-10 DIAGNOSIS — I5032 Chronic diastolic (congestive) heart failure: Secondary | ICD-10-CM | POA: Diagnosis not present

## 2016-10-10 DIAGNOSIS — Z7901 Long term (current) use of anticoagulants: Secondary | ICD-10-CM | POA: Diagnosis not present

## 2016-10-10 DIAGNOSIS — E1042 Type 1 diabetes mellitus with diabetic polyneuropathy: Secondary | ICD-10-CM | POA: Diagnosis not present

## 2016-10-10 DIAGNOSIS — E10622 Type 1 diabetes mellitus with other skin ulcer: Secondary | ICD-10-CM | POA: Diagnosis not present

## 2016-10-10 DIAGNOSIS — M5441 Lumbago with sciatica, right side: Secondary | ICD-10-CM | POA: Diagnosis not present

## 2016-10-10 DIAGNOSIS — M5442 Lumbago with sciatica, left side: Secondary | ICD-10-CM | POA: Diagnosis not present

## 2016-10-10 DIAGNOSIS — L97211 Non-pressure chronic ulcer of right calf limited to breakdown of skin: Secondary | ICD-10-CM | POA: Diagnosis not present

## 2016-10-10 DIAGNOSIS — H35039 Hypertensive retinopathy, unspecified eye: Secondary | ICD-10-CM | POA: Diagnosis not present

## 2016-10-10 DIAGNOSIS — I110 Hypertensive heart disease with heart failure: Secondary | ICD-10-CM | POA: Diagnosis not present

## 2016-10-10 DIAGNOSIS — Z5181 Encounter for therapeutic drug level monitoring: Secondary | ICD-10-CM | POA: Diagnosis not present

## 2016-10-11 DIAGNOSIS — L97211 Non-pressure chronic ulcer of right calf limited to breakdown of skin: Secondary | ICD-10-CM | POA: Diagnosis not present

## 2016-10-11 DIAGNOSIS — I110 Hypertensive heart disease with heart failure: Secondary | ICD-10-CM | POA: Diagnosis not present

## 2016-10-11 DIAGNOSIS — M5441 Lumbago with sciatica, right side: Secondary | ICD-10-CM | POA: Diagnosis not present

## 2016-10-11 DIAGNOSIS — I5032 Chronic diastolic (congestive) heart failure: Secondary | ICD-10-CM | POA: Diagnosis not present

## 2016-10-11 DIAGNOSIS — Z7901 Long term (current) use of anticoagulants: Secondary | ICD-10-CM | POA: Diagnosis not present

## 2016-10-11 DIAGNOSIS — H35039 Hypertensive retinopathy, unspecified eye: Secondary | ICD-10-CM | POA: Diagnosis not present

## 2016-10-11 DIAGNOSIS — M5442 Lumbago with sciatica, left side: Secondary | ICD-10-CM | POA: Diagnosis not present

## 2016-10-11 DIAGNOSIS — E10622 Type 1 diabetes mellitus with other skin ulcer: Secondary | ICD-10-CM | POA: Diagnosis not present

## 2016-10-11 DIAGNOSIS — E1042 Type 1 diabetes mellitus with diabetic polyneuropathy: Secondary | ICD-10-CM | POA: Diagnosis not present

## 2016-10-11 DIAGNOSIS — Z5181 Encounter for therapeutic drug level monitoring: Secondary | ICD-10-CM | POA: Diagnosis not present

## 2016-10-14 DIAGNOSIS — Z5181 Encounter for therapeutic drug level monitoring: Secondary | ICD-10-CM | POA: Diagnosis not present

## 2016-10-14 DIAGNOSIS — M5441 Lumbago with sciatica, right side: Secondary | ICD-10-CM | POA: Diagnosis not present

## 2016-10-14 DIAGNOSIS — H35039 Hypertensive retinopathy, unspecified eye: Secondary | ICD-10-CM | POA: Diagnosis not present

## 2016-10-14 DIAGNOSIS — M5442 Lumbago with sciatica, left side: Secondary | ICD-10-CM | POA: Diagnosis not present

## 2016-10-14 DIAGNOSIS — I5032 Chronic diastolic (congestive) heart failure: Secondary | ICD-10-CM | POA: Diagnosis not present

## 2016-10-14 DIAGNOSIS — E1042 Type 1 diabetes mellitus with diabetic polyneuropathy: Secondary | ICD-10-CM | POA: Diagnosis not present

## 2016-10-14 DIAGNOSIS — L97211 Non-pressure chronic ulcer of right calf limited to breakdown of skin: Secondary | ICD-10-CM | POA: Diagnosis not present

## 2016-10-14 DIAGNOSIS — E10622 Type 1 diabetes mellitus with other skin ulcer: Secondary | ICD-10-CM | POA: Diagnosis not present

## 2016-10-14 DIAGNOSIS — I110 Hypertensive heart disease with heart failure: Secondary | ICD-10-CM | POA: Diagnosis not present

## 2016-10-14 DIAGNOSIS — Z7901 Long term (current) use of anticoagulants: Secondary | ICD-10-CM | POA: Diagnosis not present

## 2016-10-15 DIAGNOSIS — I110 Hypertensive heart disease with heart failure: Secondary | ICD-10-CM | POA: Diagnosis not present

## 2016-10-15 DIAGNOSIS — E1042 Type 1 diabetes mellitus with diabetic polyneuropathy: Secondary | ICD-10-CM | POA: Diagnosis not present

## 2016-10-15 DIAGNOSIS — M5442 Lumbago with sciatica, left side: Secondary | ICD-10-CM | POA: Diagnosis not present

## 2016-10-15 DIAGNOSIS — H35039 Hypertensive retinopathy, unspecified eye: Secondary | ICD-10-CM | POA: Diagnosis not present

## 2016-10-15 DIAGNOSIS — E10622 Type 1 diabetes mellitus with other skin ulcer: Secondary | ICD-10-CM | POA: Diagnosis not present

## 2016-10-15 DIAGNOSIS — Z5181 Encounter for therapeutic drug level monitoring: Secondary | ICD-10-CM | POA: Diagnosis not present

## 2016-10-15 DIAGNOSIS — I5032 Chronic diastolic (congestive) heart failure: Secondary | ICD-10-CM | POA: Diagnosis not present

## 2016-10-15 DIAGNOSIS — L97211 Non-pressure chronic ulcer of right calf limited to breakdown of skin: Secondary | ICD-10-CM | POA: Diagnosis not present

## 2016-10-15 DIAGNOSIS — Z7901 Long term (current) use of anticoagulants: Secondary | ICD-10-CM | POA: Diagnosis not present

## 2016-10-15 DIAGNOSIS — M5441 Lumbago with sciatica, right side: Secondary | ICD-10-CM | POA: Diagnosis not present

## 2016-10-17 DIAGNOSIS — I110 Hypertensive heart disease with heart failure: Secondary | ICD-10-CM | POA: Diagnosis not present

## 2016-10-17 DIAGNOSIS — L97211 Non-pressure chronic ulcer of right calf limited to breakdown of skin: Secondary | ICD-10-CM | POA: Diagnosis not present

## 2016-10-17 DIAGNOSIS — H35039 Hypertensive retinopathy, unspecified eye: Secondary | ICD-10-CM | POA: Diagnosis not present

## 2016-10-17 DIAGNOSIS — M5441 Lumbago with sciatica, right side: Secondary | ICD-10-CM | POA: Diagnosis not present

## 2016-10-17 DIAGNOSIS — M5442 Lumbago with sciatica, left side: Secondary | ICD-10-CM | POA: Diagnosis not present

## 2016-10-17 DIAGNOSIS — E1042 Type 1 diabetes mellitus with diabetic polyneuropathy: Secondary | ICD-10-CM | POA: Diagnosis not present

## 2016-10-17 DIAGNOSIS — Z7901 Long term (current) use of anticoagulants: Secondary | ICD-10-CM | POA: Diagnosis not present

## 2016-10-17 DIAGNOSIS — Z5181 Encounter for therapeutic drug level monitoring: Secondary | ICD-10-CM | POA: Diagnosis not present

## 2016-10-17 DIAGNOSIS — E10622 Type 1 diabetes mellitus with other skin ulcer: Secondary | ICD-10-CM | POA: Diagnosis not present

## 2016-10-17 DIAGNOSIS — I5032 Chronic diastolic (congestive) heart failure: Secondary | ICD-10-CM | POA: Diagnosis not present

## 2016-10-18 DIAGNOSIS — L97211 Non-pressure chronic ulcer of right calf limited to breakdown of skin: Secondary | ICD-10-CM | POA: Diagnosis not present

## 2016-10-18 DIAGNOSIS — Z5181 Encounter for therapeutic drug level monitoring: Secondary | ICD-10-CM | POA: Diagnosis not present

## 2016-10-18 DIAGNOSIS — I5032 Chronic diastolic (congestive) heart failure: Secondary | ICD-10-CM | POA: Diagnosis not present

## 2016-10-18 DIAGNOSIS — I110 Hypertensive heart disease with heart failure: Secondary | ICD-10-CM | POA: Diagnosis not present

## 2016-10-18 DIAGNOSIS — H35039 Hypertensive retinopathy, unspecified eye: Secondary | ICD-10-CM | POA: Diagnosis not present

## 2016-10-18 DIAGNOSIS — M5442 Lumbago with sciatica, left side: Secondary | ICD-10-CM | POA: Diagnosis not present

## 2016-10-18 DIAGNOSIS — E10622 Type 1 diabetes mellitus with other skin ulcer: Secondary | ICD-10-CM | POA: Diagnosis not present

## 2016-10-18 DIAGNOSIS — E1042 Type 1 diabetes mellitus with diabetic polyneuropathy: Secondary | ICD-10-CM | POA: Diagnosis not present

## 2016-10-18 DIAGNOSIS — M5441 Lumbago with sciatica, right side: Secondary | ICD-10-CM | POA: Diagnosis not present

## 2016-10-18 DIAGNOSIS — Z7901 Long term (current) use of anticoagulants: Secondary | ICD-10-CM | POA: Diagnosis not present

## 2016-10-22 DIAGNOSIS — I110 Hypertensive heart disease with heart failure: Secondary | ICD-10-CM | POA: Diagnosis not present

## 2016-10-22 DIAGNOSIS — I5032 Chronic diastolic (congestive) heart failure: Secondary | ICD-10-CM | POA: Diagnosis not present

## 2016-10-22 DIAGNOSIS — H35039 Hypertensive retinopathy, unspecified eye: Secondary | ICD-10-CM | POA: Diagnosis not present

## 2016-10-22 DIAGNOSIS — L97211 Non-pressure chronic ulcer of right calf limited to breakdown of skin: Secondary | ICD-10-CM | POA: Diagnosis not present

## 2016-10-22 DIAGNOSIS — M5441 Lumbago with sciatica, right side: Secondary | ICD-10-CM | POA: Diagnosis not present

## 2016-10-22 DIAGNOSIS — M5442 Lumbago with sciatica, left side: Secondary | ICD-10-CM | POA: Diagnosis not present

## 2016-10-22 DIAGNOSIS — Z5181 Encounter for therapeutic drug level monitoring: Secondary | ICD-10-CM | POA: Diagnosis not present

## 2016-10-22 DIAGNOSIS — Z7901 Long term (current) use of anticoagulants: Secondary | ICD-10-CM | POA: Diagnosis not present

## 2016-10-22 DIAGNOSIS — E1042 Type 1 diabetes mellitus with diabetic polyneuropathy: Secondary | ICD-10-CM | POA: Diagnosis not present

## 2016-10-22 DIAGNOSIS — E10622 Type 1 diabetes mellitus with other skin ulcer: Secondary | ICD-10-CM | POA: Diagnosis not present

## 2016-10-25 DIAGNOSIS — I5032 Chronic diastolic (congestive) heart failure: Secondary | ICD-10-CM | POA: Diagnosis not present

## 2016-10-25 DIAGNOSIS — Z7901 Long term (current) use of anticoagulants: Secondary | ICD-10-CM | POA: Diagnosis not present

## 2016-10-25 DIAGNOSIS — H35039 Hypertensive retinopathy, unspecified eye: Secondary | ICD-10-CM | POA: Diagnosis not present

## 2016-10-25 DIAGNOSIS — M5441 Lumbago with sciatica, right side: Secondary | ICD-10-CM | POA: Diagnosis not present

## 2016-10-25 DIAGNOSIS — M5442 Lumbago with sciatica, left side: Secondary | ICD-10-CM | POA: Diagnosis not present

## 2016-10-25 DIAGNOSIS — L97211 Non-pressure chronic ulcer of right calf limited to breakdown of skin: Secondary | ICD-10-CM | POA: Diagnosis not present

## 2016-10-25 DIAGNOSIS — I110 Hypertensive heart disease with heart failure: Secondary | ICD-10-CM | POA: Diagnosis not present

## 2016-10-25 DIAGNOSIS — Z5181 Encounter for therapeutic drug level monitoring: Secondary | ICD-10-CM | POA: Diagnosis not present

## 2016-10-25 DIAGNOSIS — E10622 Type 1 diabetes mellitus with other skin ulcer: Secondary | ICD-10-CM | POA: Diagnosis not present

## 2016-10-25 DIAGNOSIS — E1042 Type 1 diabetes mellitus with diabetic polyneuropathy: Secondary | ICD-10-CM | POA: Diagnosis not present

## 2016-10-26 DIAGNOSIS — Z7901 Long term (current) use of anticoagulants: Secondary | ICD-10-CM | POA: Diagnosis not present

## 2016-10-26 DIAGNOSIS — I110 Hypertensive heart disease with heart failure: Secondary | ICD-10-CM | POA: Diagnosis not present

## 2016-10-26 DIAGNOSIS — Z5181 Encounter for therapeutic drug level monitoring: Secondary | ICD-10-CM | POA: Diagnosis not present

## 2016-10-26 DIAGNOSIS — E10622 Type 1 diabetes mellitus with other skin ulcer: Secondary | ICD-10-CM | POA: Diagnosis not present

## 2016-10-26 DIAGNOSIS — H35039 Hypertensive retinopathy, unspecified eye: Secondary | ICD-10-CM | POA: Diagnosis not present

## 2016-10-26 DIAGNOSIS — E1042 Type 1 diabetes mellitus with diabetic polyneuropathy: Secondary | ICD-10-CM | POA: Diagnosis not present

## 2016-10-26 DIAGNOSIS — L97211 Non-pressure chronic ulcer of right calf limited to breakdown of skin: Secondary | ICD-10-CM | POA: Diagnosis not present

## 2016-10-26 DIAGNOSIS — M5442 Lumbago with sciatica, left side: Secondary | ICD-10-CM | POA: Diagnosis not present

## 2016-10-26 DIAGNOSIS — I5032 Chronic diastolic (congestive) heart failure: Secondary | ICD-10-CM | POA: Diagnosis not present

## 2016-10-26 DIAGNOSIS — M5441 Lumbago with sciatica, right side: Secondary | ICD-10-CM | POA: Diagnosis not present

## 2016-10-29 DIAGNOSIS — H35039 Hypertensive retinopathy, unspecified eye: Secondary | ICD-10-CM | POA: Diagnosis not present

## 2016-10-29 DIAGNOSIS — M5441 Lumbago with sciatica, right side: Secondary | ICD-10-CM | POA: Diagnosis not present

## 2016-10-29 DIAGNOSIS — M5442 Lumbago with sciatica, left side: Secondary | ICD-10-CM | POA: Diagnosis not present

## 2016-10-29 DIAGNOSIS — I5032 Chronic diastolic (congestive) heart failure: Secondary | ICD-10-CM | POA: Diagnosis not present

## 2016-10-29 DIAGNOSIS — Z7901 Long term (current) use of anticoagulants: Secondary | ICD-10-CM | POA: Diagnosis not present

## 2016-10-29 DIAGNOSIS — Z5181 Encounter for therapeutic drug level monitoring: Secondary | ICD-10-CM | POA: Diagnosis not present

## 2016-10-29 DIAGNOSIS — E10622 Type 1 diabetes mellitus with other skin ulcer: Secondary | ICD-10-CM | POA: Diagnosis not present

## 2016-10-29 DIAGNOSIS — E1042 Type 1 diabetes mellitus with diabetic polyneuropathy: Secondary | ICD-10-CM | POA: Diagnosis not present

## 2016-10-29 DIAGNOSIS — L97211 Non-pressure chronic ulcer of right calf limited to breakdown of skin: Secondary | ICD-10-CM | POA: Diagnosis not present

## 2016-10-29 DIAGNOSIS — I110 Hypertensive heart disease with heart failure: Secondary | ICD-10-CM | POA: Diagnosis not present

## 2016-10-31 ENCOUNTER — Ambulatory Visit: Payer: Self-pay

## 2016-10-31 DIAGNOSIS — E10622 Type 1 diabetes mellitus with other skin ulcer: Secondary | ICD-10-CM | POA: Diagnosis not present

## 2016-10-31 DIAGNOSIS — I5032 Chronic diastolic (congestive) heart failure: Secondary | ICD-10-CM | POA: Diagnosis not present

## 2016-10-31 DIAGNOSIS — M5442 Lumbago with sciatica, left side: Secondary | ICD-10-CM | POA: Diagnosis not present

## 2016-10-31 DIAGNOSIS — I110 Hypertensive heart disease with heart failure: Secondary | ICD-10-CM | POA: Diagnosis not present

## 2016-10-31 DIAGNOSIS — Z5181 Encounter for therapeutic drug level monitoring: Secondary | ICD-10-CM | POA: Diagnosis not present

## 2016-10-31 DIAGNOSIS — H35039 Hypertensive retinopathy, unspecified eye: Secondary | ICD-10-CM | POA: Diagnosis not present

## 2016-10-31 DIAGNOSIS — Z7901 Long term (current) use of anticoagulants: Secondary | ICD-10-CM | POA: Diagnosis not present

## 2016-10-31 DIAGNOSIS — M5441 Lumbago with sciatica, right side: Secondary | ICD-10-CM | POA: Diagnosis not present

## 2016-10-31 DIAGNOSIS — E1042 Type 1 diabetes mellitus with diabetic polyneuropathy: Secondary | ICD-10-CM | POA: Diagnosis not present

## 2016-10-31 DIAGNOSIS — L97211 Non-pressure chronic ulcer of right calf limited to breakdown of skin: Secondary | ICD-10-CM | POA: Diagnosis not present

## 2016-11-01 ENCOUNTER — Ambulatory Visit: Payer: Self-pay

## 2016-11-01 DIAGNOSIS — Z7901 Long term (current) use of anticoagulants: Secondary | ICD-10-CM | POA: Diagnosis not present

## 2016-11-01 DIAGNOSIS — E1042 Type 1 diabetes mellitus with diabetic polyneuropathy: Secondary | ICD-10-CM | POA: Diagnosis not present

## 2016-11-01 DIAGNOSIS — E10622 Type 1 diabetes mellitus with other skin ulcer: Secondary | ICD-10-CM | POA: Diagnosis not present

## 2016-11-01 DIAGNOSIS — Z5181 Encounter for therapeutic drug level monitoring: Secondary | ICD-10-CM | POA: Diagnosis not present

## 2016-11-01 DIAGNOSIS — L97211 Non-pressure chronic ulcer of right calf limited to breakdown of skin: Secondary | ICD-10-CM | POA: Diagnosis not present

## 2016-11-01 DIAGNOSIS — I110 Hypertensive heart disease with heart failure: Secondary | ICD-10-CM | POA: Diagnosis not present

## 2016-11-01 DIAGNOSIS — M5442 Lumbago with sciatica, left side: Secondary | ICD-10-CM | POA: Diagnosis not present

## 2016-11-01 DIAGNOSIS — H35039 Hypertensive retinopathy, unspecified eye: Secondary | ICD-10-CM | POA: Diagnosis not present

## 2016-11-01 DIAGNOSIS — M5441 Lumbago with sciatica, right side: Secondary | ICD-10-CM | POA: Diagnosis not present

## 2016-11-01 DIAGNOSIS — I5032 Chronic diastolic (congestive) heart failure: Secondary | ICD-10-CM | POA: Diagnosis not present

## 2016-11-04 ENCOUNTER — Ambulatory Visit: Payer: Self-pay

## 2016-11-04 DIAGNOSIS — I10 Essential (primary) hypertension: Secondary | ICD-10-CM | POA: Diagnosis not present

## 2016-11-04 DIAGNOSIS — Z125 Encounter for screening for malignant neoplasm of prostate: Secondary | ICD-10-CM | POA: Diagnosis not present

## 2016-11-04 DIAGNOSIS — E119 Type 2 diabetes mellitus without complications: Secondary | ICD-10-CM | POA: Diagnosis not present

## 2016-11-05 ENCOUNTER — Other Ambulatory Visit: Payer: Medicare Other

## 2016-11-05 DIAGNOSIS — H35039 Hypertensive retinopathy, unspecified eye: Secondary | ICD-10-CM | POA: Diagnosis not present

## 2016-11-05 DIAGNOSIS — L97211 Non-pressure chronic ulcer of right calf limited to breakdown of skin: Secondary | ICD-10-CM | POA: Diagnosis not present

## 2016-11-05 DIAGNOSIS — Z7901 Long term (current) use of anticoagulants: Secondary | ICD-10-CM | POA: Diagnosis not present

## 2016-11-05 DIAGNOSIS — M5441 Lumbago with sciatica, right side: Secondary | ICD-10-CM | POA: Diagnosis not present

## 2016-11-05 DIAGNOSIS — E10622 Type 1 diabetes mellitus with other skin ulcer: Secondary | ICD-10-CM | POA: Diagnosis not present

## 2016-11-05 DIAGNOSIS — Z5181 Encounter for therapeutic drug level monitoring: Secondary | ICD-10-CM | POA: Diagnosis not present

## 2016-11-05 DIAGNOSIS — M5442 Lumbago with sciatica, left side: Secondary | ICD-10-CM | POA: Diagnosis not present

## 2016-11-05 DIAGNOSIS — I110 Hypertensive heart disease with heart failure: Secondary | ICD-10-CM | POA: Diagnosis not present

## 2016-11-05 DIAGNOSIS — E1042 Type 1 diabetes mellitus with diabetic polyneuropathy: Secondary | ICD-10-CM | POA: Diagnosis not present

## 2016-11-05 DIAGNOSIS — I5032 Chronic diastolic (congestive) heart failure: Secondary | ICD-10-CM | POA: Diagnosis not present
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
## 2016-11-07 DIAGNOSIS — E119 Type 2 diabetes mellitus without complications: Secondary | ICD-10-CM | POA: Diagnosis not present

## 2016-11-07 DIAGNOSIS — G8929 Other chronic pain: Secondary | ICD-10-CM | POA: Diagnosis not present

## 2016-11-07 DIAGNOSIS — Z0000 Encounter for general adult medical examination without abnormal findings: Secondary | ICD-10-CM | POA: Diagnosis not present

## 2016-11-07 DIAGNOSIS — M5442 Lumbago with sciatica, left side: Secondary | ICD-10-CM | POA: Diagnosis not present

## 2016-11-08 DIAGNOSIS — E1042 Type 1 diabetes mellitus with diabetic polyneuropathy: Secondary | ICD-10-CM | POA: Diagnosis not present

## 2016-11-08 DIAGNOSIS — I110 Hypertensive heart disease with heart failure: Secondary | ICD-10-CM | POA: Diagnosis not present

## 2016-11-08 DIAGNOSIS — Z5181 Encounter for therapeutic drug level monitoring: Secondary | ICD-10-CM | POA: Diagnosis not present

## 2016-11-08 DIAGNOSIS — I5032 Chronic diastolic (congestive) heart failure: Secondary | ICD-10-CM | POA: Diagnosis not present

## 2016-11-08 DIAGNOSIS — E10622 Type 1 diabetes mellitus with other skin ulcer: Secondary | ICD-10-CM | POA: Diagnosis not present

## 2016-11-08 DIAGNOSIS — M5442 Lumbago with sciatica, left side: Secondary | ICD-10-CM | POA: Diagnosis not present

## 2016-11-08 DIAGNOSIS — M5441 Lumbago with sciatica, right side: Secondary | ICD-10-CM | POA: Diagnosis not present

## 2016-11-08 DIAGNOSIS — Z7901 Long term (current) use of anticoagulants: Secondary | ICD-10-CM | POA: Diagnosis not present

## 2016-11-08 DIAGNOSIS — L97211 Non-pressure chronic ulcer of right calf limited to breakdown of skin: Secondary | ICD-10-CM | POA: Diagnosis not present

## 2016-11-08 DIAGNOSIS — H35039 Hypertensive retinopathy, unspecified eye: Secondary | ICD-10-CM | POA: Diagnosis not present

## 2016-11-12 DIAGNOSIS — M5441 Lumbago with sciatica, right side: Secondary | ICD-10-CM | POA: Diagnosis not present

## 2016-11-12 DIAGNOSIS — E1042 Type 1 diabetes mellitus with diabetic polyneuropathy: Secondary | ICD-10-CM | POA: Diagnosis not present

## 2016-11-12 DIAGNOSIS — I5032 Chronic diastolic (congestive) heart failure: Secondary | ICD-10-CM | POA: Diagnosis not present

## 2016-11-12 DIAGNOSIS — L97211 Non-pressure chronic ulcer of right calf limited to breakdown of skin: Secondary | ICD-10-CM | POA: Diagnosis not present

## 2016-11-12 DIAGNOSIS — Z5181 Encounter for therapeutic drug level monitoring: Secondary | ICD-10-CM | POA: Diagnosis not present

## 2016-11-12 DIAGNOSIS — Z7901 Long term (current) use of anticoagulants: Secondary | ICD-10-CM | POA: Diagnosis not present

## 2016-11-12 DIAGNOSIS — E10622 Type 1 diabetes mellitus with other skin ulcer: Secondary | ICD-10-CM | POA: Diagnosis not present

## 2016-11-12 DIAGNOSIS — I110 Hypertensive heart disease with heart failure: Secondary | ICD-10-CM | POA: Diagnosis not present

## 2016-11-12 DIAGNOSIS — H35039 Hypertensive retinopathy, unspecified eye: Secondary | ICD-10-CM | POA: Diagnosis not present

## 2016-11-12 DIAGNOSIS — M5442 Lumbago with sciatica, left side: Secondary | ICD-10-CM | POA: Diagnosis not present

## 2016-11-15 DIAGNOSIS — Z5181 Encounter for therapeutic drug level monitoring: Secondary | ICD-10-CM | POA: Diagnosis not present

## 2016-11-15 DIAGNOSIS — H35039 Hypertensive retinopathy, unspecified eye: Secondary | ICD-10-CM | POA: Diagnosis not present

## 2016-11-15 DIAGNOSIS — E1042 Type 1 diabetes mellitus with diabetic polyneuropathy: Secondary | ICD-10-CM | POA: Diagnosis not present

## 2016-11-15 DIAGNOSIS — M5442 Lumbago with sciatica, left side: Secondary | ICD-10-CM | POA: Diagnosis not present

## 2016-11-15 DIAGNOSIS — Z7901 Long term (current) use of anticoagulants: Secondary | ICD-10-CM | POA: Diagnosis not present

## 2016-11-15 DIAGNOSIS — E10622 Type 1 diabetes mellitus with other skin ulcer: Secondary | ICD-10-CM | POA: Diagnosis not present

## 2016-11-15 DIAGNOSIS — I5032 Chronic diastolic (congestive) heart failure: Secondary | ICD-10-CM | POA: Diagnosis not present

## 2016-11-15 DIAGNOSIS — M5441 Lumbago with sciatica, right side: Secondary | ICD-10-CM | POA: Diagnosis not present

## 2016-11-15 DIAGNOSIS — I110 Hypertensive heart disease with heart failure: Secondary | ICD-10-CM | POA: Diagnosis not present

## 2016-11-15 DIAGNOSIS — L97211 Non-pressure chronic ulcer of right calf limited to breakdown of skin: Secondary | ICD-10-CM | POA: Diagnosis not present

## 2016-11-18 DIAGNOSIS — I110 Hypertensive heart disease with heart failure: Secondary | ICD-10-CM | POA: Diagnosis not present

## 2016-11-18 DIAGNOSIS — M5441 Lumbago with sciatica, right side: Secondary | ICD-10-CM | POA: Diagnosis not present

## 2016-11-18 DIAGNOSIS — E1042 Type 1 diabetes mellitus with diabetic polyneuropathy: Secondary | ICD-10-CM | POA: Diagnosis not present

## 2016-11-18 DIAGNOSIS — H35039 Hypertensive retinopathy, unspecified eye: Secondary | ICD-10-CM | POA: Diagnosis not present

## 2016-11-18 DIAGNOSIS — Z79891 Long term (current) use of opiate analgesic: Secondary | ICD-10-CM | POA: Diagnosis not present

## 2016-11-18 DIAGNOSIS — I5032 Chronic diastolic (congestive) heart failure: Secondary | ICD-10-CM | POA: Diagnosis not present

## 2016-11-18 DIAGNOSIS — M5442 Lumbago with sciatica, left side: Secondary | ICD-10-CM | POA: Diagnosis not present

## 2016-11-18 DIAGNOSIS — Z7901 Long term (current) use of anticoagulants: Secondary | ICD-10-CM | POA: Diagnosis not present

## 2016-11-19 DIAGNOSIS — G629 Polyneuropathy, unspecified: Secondary | ICD-10-CM | POA: Diagnosis not present

## 2016-11-19 DIAGNOSIS — R202 Paresthesia of skin: Secondary | ICD-10-CM | POA: Diagnosis not present

## 2016-11-19 DIAGNOSIS — R200 Anesthesia of skin: Secondary | ICD-10-CM | POA: Diagnosis not present

## 2016-11-19 DIAGNOSIS — R29898 Other symptoms and signs involving the musculoskeletal system: Secondary | ICD-10-CM | POA: Diagnosis not present

## 2016-11-22 DIAGNOSIS — M5441 Lumbago with sciatica, right side: Secondary | ICD-10-CM | POA: Diagnosis not present

## 2016-11-22 DIAGNOSIS — H35039 Hypertensive retinopathy, unspecified eye: Secondary | ICD-10-CM | POA: Diagnosis not present

## 2016-11-22 DIAGNOSIS — Z7901 Long term (current) use of anticoagulants: Secondary | ICD-10-CM | POA: Diagnosis not present

## 2016-11-22 DIAGNOSIS — I5032 Chronic diastolic (congestive) heart failure: Secondary | ICD-10-CM | POA: Diagnosis not present

## 2016-11-22 DIAGNOSIS — M5442 Lumbago with sciatica, left side: Secondary | ICD-10-CM | POA: Diagnosis not present

## 2016-11-22 DIAGNOSIS — Z79891 Long term (current) use of opiate analgesic: Secondary | ICD-10-CM | POA: Diagnosis not present

## 2016-11-22 DIAGNOSIS — I110 Hypertensive heart disease with heart failure: Secondary | ICD-10-CM | POA: Diagnosis not present

## 2016-11-22 DIAGNOSIS — E1042 Type 1 diabetes mellitus with diabetic polyneuropathy: Secondary | ICD-10-CM | POA: Diagnosis not present

## 2016-11-26 DIAGNOSIS — E1042 Type 1 diabetes mellitus with diabetic polyneuropathy: Secondary | ICD-10-CM | POA: Diagnosis not present

## 2016-11-26 DIAGNOSIS — I5032 Chronic diastolic (congestive) heart failure: Secondary | ICD-10-CM | POA: Diagnosis not present

## 2016-11-26 DIAGNOSIS — I110 Hypertensive heart disease with heart failure: Secondary | ICD-10-CM | POA: Diagnosis not present

## 2016-11-26 DIAGNOSIS — M5441 Lumbago with sciatica, right side: Secondary | ICD-10-CM | POA: Diagnosis not present

## 2016-11-26 DIAGNOSIS — M5442 Lumbago with sciatica, left side: Secondary | ICD-10-CM | POA: Diagnosis not present

## 2016-11-26 DIAGNOSIS — Z79891 Long term (current) use of opiate analgesic: Secondary | ICD-10-CM | POA: Diagnosis not present

## 2016-11-26 DIAGNOSIS — H35039 Hypertensive retinopathy, unspecified eye: Secondary | ICD-10-CM | POA: Diagnosis not present

## 2016-11-26 DIAGNOSIS — Z7901 Long term (current) use of anticoagulants: Secondary | ICD-10-CM | POA: Diagnosis not present

## 2016-11-27 DIAGNOSIS — R29898 Other symptoms and signs involving the musculoskeletal system: Secondary | ICD-10-CM | POA: Diagnosis not present

## 2016-11-27 DIAGNOSIS — M47816 Spondylosis without myelopathy or radiculopathy, lumbar region: Secondary | ICD-10-CM | POA: Diagnosis not present

## 2016-11-27 DIAGNOSIS — M5124 Other intervertebral disc displacement, thoracic region: Secondary | ICD-10-CM | POA: Diagnosis not present

## 2016-11-28 DIAGNOSIS — E1042 Type 1 diabetes mellitus with diabetic polyneuropathy: Secondary | ICD-10-CM | POA: Diagnosis not present

## 2016-11-28 DIAGNOSIS — Z79891 Long term (current) use of opiate analgesic: Secondary | ICD-10-CM | POA: Diagnosis not present

## 2016-11-28 DIAGNOSIS — M5442 Lumbago with sciatica, left side: Secondary | ICD-10-CM | POA: Diagnosis not present

## 2016-11-28 DIAGNOSIS — M5441 Lumbago with sciatica, right side: Secondary | ICD-10-CM | POA: Diagnosis not present

## 2016-11-28 DIAGNOSIS — Z7901 Long term (current) use of anticoagulants: Secondary | ICD-10-CM | POA: Diagnosis not present

## 2016-11-28 DIAGNOSIS — I5032 Chronic diastolic (congestive) heart failure: Secondary | ICD-10-CM | POA: Diagnosis not present

## 2016-11-28 DIAGNOSIS — I110 Hypertensive heart disease with heart failure: Secondary | ICD-10-CM | POA: Diagnosis not present

## 2016-11-28 DIAGNOSIS — H35039 Hypertensive retinopathy, unspecified eye: Secondary | ICD-10-CM | POA: Diagnosis not present

## 2016-11-29 DIAGNOSIS — M5441 Lumbago with sciatica, right side: Secondary | ICD-10-CM | POA: Diagnosis not present

## 2016-11-29 DIAGNOSIS — Z79891 Long term (current) use of opiate analgesic: Secondary | ICD-10-CM | POA: Diagnosis not present

## 2016-11-29 DIAGNOSIS — H35039 Hypertensive retinopathy, unspecified eye: Secondary | ICD-10-CM | POA: Diagnosis not present

## 2016-11-29 DIAGNOSIS — I5032 Chronic diastolic (congestive) heart failure: Secondary | ICD-10-CM | POA: Diagnosis not present

## 2016-11-29 DIAGNOSIS — Z7901 Long term (current) use of anticoagulants: Secondary | ICD-10-CM | POA: Diagnosis not present

## 2016-11-29 DIAGNOSIS — E1042 Type 1 diabetes mellitus with diabetic polyneuropathy: Secondary | ICD-10-CM | POA: Diagnosis not present

## 2016-11-29 DIAGNOSIS — M5442 Lumbago with sciatica, left side: Secondary | ICD-10-CM | POA: Diagnosis not present

## 2016-11-29 DIAGNOSIS — I110 Hypertensive heart disease with heart failure: Secondary | ICD-10-CM | POA: Diagnosis not present

## 2016-12-03 DIAGNOSIS — I110 Hypertensive heart disease with heart failure: Secondary | ICD-10-CM | POA: Diagnosis not present

## 2016-12-03 DIAGNOSIS — G5611 Other lesions of median nerve, right upper limb: Secondary | ICD-10-CM | POA: Diagnosis not present

## 2016-12-03 DIAGNOSIS — M5441 Lumbago with sciatica, right side: Secondary | ICD-10-CM | POA: Diagnosis not present

## 2016-12-03 DIAGNOSIS — M5442 Lumbago with sciatica, left side: Secondary | ICD-10-CM | POA: Diagnosis not present

## 2016-12-03 DIAGNOSIS — M5416 Radiculopathy, lumbar region: Secondary | ICD-10-CM | POA: Diagnosis not present

## 2016-12-03 DIAGNOSIS — E1042 Type 1 diabetes mellitus with diabetic polyneuropathy: Secondary | ICD-10-CM | POA: Diagnosis not present

## 2016-12-03 DIAGNOSIS — H35039 Hypertensive retinopathy, unspecified eye: Secondary | ICD-10-CM | POA: Diagnosis not present

## 2016-12-03 DIAGNOSIS — I5032 Chronic diastolic (congestive) heart failure: Secondary | ICD-10-CM | POA: Diagnosis not present

## 2016-12-03 DIAGNOSIS — Z7901 Long term (current) use of anticoagulants: Secondary | ICD-10-CM | POA: Diagnosis not present

## 2016-12-03 DIAGNOSIS — Z79891 Long term (current) use of opiate analgesic: Secondary | ICD-10-CM | POA: Diagnosis not present

## 2016-12-05 DIAGNOSIS — H35039 Hypertensive retinopathy, unspecified eye: Secondary | ICD-10-CM | POA: Diagnosis not present

## 2016-12-05 DIAGNOSIS — E1042 Type 1 diabetes mellitus with diabetic polyneuropathy: Secondary | ICD-10-CM | POA: Diagnosis not present

## 2016-12-05 DIAGNOSIS — Z7901 Long term (current) use of anticoagulants: Secondary | ICD-10-CM | POA: Diagnosis not present

## 2016-12-05 DIAGNOSIS — I5032 Chronic diastolic (congestive) heart failure: Secondary | ICD-10-CM | POA: Diagnosis not present

## 2016-12-05 DIAGNOSIS — M5441 Lumbago with sciatica, right side: Secondary | ICD-10-CM | POA: Diagnosis not present

## 2016-12-05 DIAGNOSIS — Z79891 Long term (current) use of opiate analgesic: Secondary | ICD-10-CM | POA: Diagnosis not present

## 2016-12-05 DIAGNOSIS — I110 Hypertensive heart disease with heart failure: Secondary | ICD-10-CM | POA: Diagnosis not present

## 2016-12-05 DIAGNOSIS — M5442 Lumbago with sciatica, left side: Secondary | ICD-10-CM | POA: Diagnosis not present

## 2016-12-06 DIAGNOSIS — I110 Hypertensive heart disease with heart failure: Secondary | ICD-10-CM | POA: Diagnosis not present

## 2016-12-06 DIAGNOSIS — I5032 Chronic diastolic (congestive) heart failure: Secondary | ICD-10-CM | POA: Diagnosis not present

## 2016-12-06 DIAGNOSIS — M5442 Lumbago with sciatica, left side: Secondary | ICD-10-CM | POA: Diagnosis not present

## 2016-12-06 DIAGNOSIS — E1042 Type 1 diabetes mellitus with diabetic polyneuropathy: Secondary | ICD-10-CM | POA: Diagnosis not present

## 2016-12-06 DIAGNOSIS — Z79891 Long term (current) use of opiate analgesic: Secondary | ICD-10-CM | POA: Diagnosis not present

## 2016-12-06 DIAGNOSIS — Z7901 Long term (current) use of anticoagulants: Secondary | ICD-10-CM | POA: Diagnosis not present

## 2016-12-06 DIAGNOSIS — H35039 Hypertensive retinopathy, unspecified eye: Secondary | ICD-10-CM | POA: Diagnosis not present

## 2016-12-06 DIAGNOSIS — M5441 Lumbago with sciatica, right side: Secondary | ICD-10-CM | POA: Diagnosis not present

## 2016-12-10 DIAGNOSIS — M5442 Lumbago with sciatica, left side: Secondary | ICD-10-CM | POA: Diagnosis not present

## 2016-12-10 DIAGNOSIS — I5032 Chronic diastolic (congestive) heart failure: Secondary | ICD-10-CM | POA: Diagnosis not present

## 2016-12-10 DIAGNOSIS — Z79891 Long term (current) use of opiate analgesic: Secondary | ICD-10-CM | POA: Diagnosis not present

## 2016-12-10 DIAGNOSIS — E1042 Type 1 diabetes mellitus with diabetic polyneuropathy: Secondary | ICD-10-CM | POA: Diagnosis not present

## 2016-12-10 DIAGNOSIS — Z7901 Long term (current) use of anticoagulants: Secondary | ICD-10-CM | POA: Diagnosis not present

## 2016-12-10 DIAGNOSIS — H35039 Hypertensive retinopathy, unspecified eye: Secondary | ICD-10-CM | POA: Diagnosis not present

## 2016-12-10 DIAGNOSIS — I110 Hypertensive heart disease with heart failure: Secondary | ICD-10-CM | POA: Diagnosis not present

## 2016-12-10 DIAGNOSIS — M5441 Lumbago with sciatica, right side: Secondary | ICD-10-CM | POA: Diagnosis not present

## 2016-12-13 DIAGNOSIS — Z7901 Long term (current) use of anticoagulants: Secondary | ICD-10-CM | POA: Diagnosis not present

## 2016-12-13 DIAGNOSIS — I5032 Chronic diastolic (congestive) heart failure: Secondary | ICD-10-CM | POA: Diagnosis not present

## 2016-12-13 DIAGNOSIS — E1042 Type 1 diabetes mellitus with diabetic polyneuropathy: Secondary | ICD-10-CM | POA: Diagnosis not present

## 2016-12-13 DIAGNOSIS — I110 Hypertensive heart disease with heart failure: Secondary | ICD-10-CM | POA: Diagnosis not present

## 2016-12-13 DIAGNOSIS — H35039 Hypertensive retinopathy, unspecified eye: Secondary | ICD-10-CM | POA: Diagnosis not present

## 2016-12-13 DIAGNOSIS — M5442 Lumbago with sciatica, left side: Secondary | ICD-10-CM | POA: Diagnosis not present

## 2016-12-13 DIAGNOSIS — Z79891 Long term (current) use of opiate analgesic: Secondary | ICD-10-CM | POA: Diagnosis not present

## 2016-12-13 DIAGNOSIS — M5441 Lumbago with sciatica, right side: Secondary | ICD-10-CM | POA: Diagnosis not present

## 2016-12-17 DIAGNOSIS — Z79891 Long term (current) use of opiate analgesic: Secondary | ICD-10-CM | POA: Diagnosis not present

## 2016-12-17 DIAGNOSIS — M5441 Lumbago with sciatica, right side: Secondary | ICD-10-CM | POA: Diagnosis not present

## 2016-12-17 DIAGNOSIS — M5442 Lumbago with sciatica, left side: Secondary | ICD-10-CM | POA: Diagnosis not present

## 2016-12-17 DIAGNOSIS — E1042 Type 1 diabetes mellitus with diabetic polyneuropathy: Secondary | ICD-10-CM | POA: Diagnosis not present

## 2016-12-17 DIAGNOSIS — I5032 Chronic diastolic (congestive) heart failure: Secondary | ICD-10-CM | POA: Diagnosis not present

## 2016-12-17 DIAGNOSIS — I110 Hypertensive heart disease with heart failure: Secondary | ICD-10-CM | POA: Diagnosis not present

## 2016-12-17 DIAGNOSIS — Z7901 Long term (current) use of anticoagulants: Secondary | ICD-10-CM | POA: Diagnosis not present

## 2016-12-17 DIAGNOSIS — H35039 Hypertensive retinopathy, unspecified eye: Secondary | ICD-10-CM | POA: Diagnosis not present

## 2016-12-19 DIAGNOSIS — E1042 Type 1 diabetes mellitus with diabetic polyneuropathy: Secondary | ICD-10-CM | POA: Diagnosis not present

## 2016-12-19 DIAGNOSIS — Z7901 Long term (current) use of anticoagulants: Secondary | ICD-10-CM | POA: Diagnosis not present

## 2016-12-19 DIAGNOSIS — M5441 Lumbago with sciatica, right side: Secondary | ICD-10-CM | POA: Diagnosis not present

## 2016-12-19 DIAGNOSIS — M5442 Lumbago with sciatica, left side: Secondary | ICD-10-CM | POA: Diagnosis not present

## 2016-12-19 DIAGNOSIS — H35039 Hypertensive retinopathy, unspecified eye: Secondary | ICD-10-CM | POA: Diagnosis not present

## 2016-12-19 DIAGNOSIS — I5032 Chronic diastolic (congestive) heart failure: Secondary | ICD-10-CM | POA: Diagnosis not present

## 2016-12-19 DIAGNOSIS — Z79891 Long term (current) use of opiate analgesic: Secondary | ICD-10-CM | POA: Diagnosis not present

## 2016-12-19 DIAGNOSIS — I110 Hypertensive heart disease with heart failure: Secondary | ICD-10-CM | POA: Diagnosis not present

## 2016-12-20 DIAGNOSIS — I110 Hypertensive heart disease with heart failure: Secondary | ICD-10-CM | POA: Diagnosis not present

## 2016-12-20 DIAGNOSIS — Z79891 Long term (current) use of opiate analgesic: Secondary | ICD-10-CM | POA: Diagnosis not present

## 2016-12-20 DIAGNOSIS — M5442 Lumbago with sciatica, left side: Secondary | ICD-10-CM | POA: Diagnosis not present

## 2016-12-20 DIAGNOSIS — H35039 Hypertensive retinopathy, unspecified eye: Secondary | ICD-10-CM | POA: Diagnosis not present

## 2016-12-20 DIAGNOSIS — E1042 Type 1 diabetes mellitus with diabetic polyneuropathy: Secondary | ICD-10-CM | POA: Diagnosis not present

## 2016-12-20 DIAGNOSIS — I5032 Chronic diastolic (congestive) heart failure: Secondary | ICD-10-CM | POA: Diagnosis not present

## 2016-12-20 DIAGNOSIS — Z7901 Long term (current) use of anticoagulants: Secondary | ICD-10-CM | POA: Diagnosis not present

## 2016-12-20 DIAGNOSIS — M5441 Lumbago with sciatica, right side: Secondary | ICD-10-CM | POA: Diagnosis not present

## 2016-12-24 DIAGNOSIS — E1042 Type 1 diabetes mellitus with diabetic polyneuropathy: Secondary | ICD-10-CM | POA: Diagnosis not present

## 2016-12-24 DIAGNOSIS — Z7901 Long term (current) use of anticoagulants: Secondary | ICD-10-CM | POA: Diagnosis not present

## 2016-12-24 DIAGNOSIS — M5441 Lumbago with sciatica, right side: Secondary | ICD-10-CM | POA: Diagnosis not present

## 2016-12-24 DIAGNOSIS — H35039 Hypertensive retinopathy, unspecified eye: Secondary | ICD-10-CM | POA: Diagnosis not present

## 2016-12-24 DIAGNOSIS — M5442 Lumbago with sciatica, left side: Secondary | ICD-10-CM | POA: Diagnosis not present

## 2016-12-24 DIAGNOSIS — I5032 Chronic diastolic (congestive) heart failure: Secondary | ICD-10-CM | POA: Diagnosis not present

## 2016-12-24 DIAGNOSIS — Z79891 Long term (current) use of opiate analgesic: Secondary | ICD-10-CM | POA: Diagnosis not present

## 2016-12-24 DIAGNOSIS — I110 Hypertensive heart disease with heart failure: Secondary | ICD-10-CM | POA: Diagnosis not present

## 2016-12-26 DIAGNOSIS — M5442 Lumbago with sciatica, left side: Secondary | ICD-10-CM | POA: Diagnosis not present

## 2016-12-26 DIAGNOSIS — Z79891 Long term (current) use of opiate analgesic: Secondary | ICD-10-CM | POA: Diagnosis not present

## 2016-12-26 DIAGNOSIS — M5441 Lumbago with sciatica, right side: Secondary | ICD-10-CM | POA: Diagnosis not present

## 2016-12-26 DIAGNOSIS — I5032 Chronic diastolic (congestive) heart failure: Secondary | ICD-10-CM | POA: Diagnosis not present

## 2016-12-26 DIAGNOSIS — I110 Hypertensive heart disease with heart failure: Secondary | ICD-10-CM | POA: Diagnosis not present

## 2016-12-26 DIAGNOSIS — Z7901 Long term (current) use of anticoagulants: Secondary | ICD-10-CM | POA: Diagnosis not present

## 2016-12-26 DIAGNOSIS — E1042 Type 1 diabetes mellitus with diabetic polyneuropathy: Secondary | ICD-10-CM | POA: Diagnosis not present

## 2016-12-26 DIAGNOSIS — H35039 Hypertensive retinopathy, unspecified eye: Secondary | ICD-10-CM | POA: Diagnosis not present

## 2016-12-27 DIAGNOSIS — M5442 Lumbago with sciatica, left side: Secondary | ICD-10-CM | POA: Diagnosis not present

## 2016-12-27 DIAGNOSIS — E1042 Type 1 diabetes mellitus with diabetic polyneuropathy: Secondary | ICD-10-CM | POA: Diagnosis not present

## 2016-12-27 DIAGNOSIS — Z79891 Long term (current) use of opiate analgesic: Secondary | ICD-10-CM | POA: Diagnosis not present

## 2016-12-27 DIAGNOSIS — Z7901 Long term (current) use of anticoagulants: Secondary | ICD-10-CM | POA: Diagnosis not present

## 2016-12-27 DIAGNOSIS — H35039 Hypertensive retinopathy, unspecified eye: Secondary | ICD-10-CM | POA: Diagnosis not present

## 2016-12-27 DIAGNOSIS — I5032 Chronic diastolic (congestive) heart failure: Secondary | ICD-10-CM | POA: Diagnosis not present

## 2016-12-27 DIAGNOSIS — I110 Hypertensive heart disease with heart failure: Secondary | ICD-10-CM | POA: Diagnosis not present

## 2016-12-27 DIAGNOSIS — M5441 Lumbago with sciatica, right side: Secondary | ICD-10-CM | POA: Diagnosis not present

## 2016-12-30 DIAGNOSIS — R29898 Other symptoms and signs involving the musculoskeletal system: Secondary | ICD-10-CM | POA: Diagnosis not present

## 2016-12-30 DIAGNOSIS — I70203 Unspecified atherosclerosis of native arteries of extremities, bilateral legs: Secondary | ICD-10-CM | POA: Diagnosis not present

## 2016-12-31 DIAGNOSIS — Z79891 Long term (current) use of opiate analgesic: Secondary | ICD-10-CM | POA: Diagnosis not present

## 2016-12-31 DIAGNOSIS — I5032 Chronic diastolic (congestive) heart failure: Secondary | ICD-10-CM | POA: Diagnosis not present

## 2016-12-31 DIAGNOSIS — E1042 Type 1 diabetes mellitus with diabetic polyneuropathy: Secondary | ICD-10-CM | POA: Diagnosis not present

## 2016-12-31 DIAGNOSIS — M5441 Lumbago with sciatica, right side: Secondary | ICD-10-CM | POA: Diagnosis not present

## 2016-12-31 DIAGNOSIS — H35039 Hypertensive retinopathy, unspecified eye: Secondary | ICD-10-CM | POA: Diagnosis not present

## 2016-12-31 DIAGNOSIS — I110 Hypertensive heart disease with heart failure: Secondary | ICD-10-CM | POA: Diagnosis not present

## 2016-12-31 DIAGNOSIS — M5442 Lumbago with sciatica, left side: Secondary | ICD-10-CM | POA: Diagnosis not present

## 2016-12-31 DIAGNOSIS — Z7901 Long term (current) use of anticoagulants: Secondary | ICD-10-CM | POA: Diagnosis not present

## 2017-01-01 DIAGNOSIS — I5032 Chronic diastolic (congestive) heart failure: Secondary | ICD-10-CM | POA: Diagnosis not present

## 2017-01-01 DIAGNOSIS — I110 Hypertensive heart disease with heart failure: Secondary | ICD-10-CM | POA: Diagnosis not present

## 2017-01-01 DIAGNOSIS — H35039 Hypertensive retinopathy, unspecified eye: Secondary | ICD-10-CM | POA: Diagnosis not present

## 2017-01-01 DIAGNOSIS — M5441 Lumbago with sciatica, right side: Secondary | ICD-10-CM | POA: Diagnosis not present

## 2017-01-01 DIAGNOSIS — Z7901 Long term (current) use of anticoagulants: Secondary | ICD-10-CM | POA: Diagnosis not present

## 2017-01-01 DIAGNOSIS — M5442 Lumbago with sciatica, left side: Secondary | ICD-10-CM | POA: Diagnosis not present

## 2017-01-01 DIAGNOSIS — Z79891 Long term (current) use of opiate analgesic: Secondary | ICD-10-CM | POA: Diagnosis not present

## 2017-01-01 DIAGNOSIS — E1042 Type 1 diabetes mellitus with diabetic polyneuropathy: Secondary | ICD-10-CM | POA: Diagnosis not present

## 2017-01-02 DIAGNOSIS — M5442 Lumbago with sciatica, left side: Secondary | ICD-10-CM | POA: Diagnosis not present

## 2017-01-02 DIAGNOSIS — H35039 Hypertensive retinopathy, unspecified eye: Secondary | ICD-10-CM | POA: Diagnosis not present

## 2017-01-02 DIAGNOSIS — E1042 Type 1 diabetes mellitus with diabetic polyneuropathy: Secondary | ICD-10-CM | POA: Diagnosis not present

## 2017-01-02 DIAGNOSIS — Z7901 Long term (current) use of anticoagulants: Secondary | ICD-10-CM | POA: Diagnosis not present

## 2017-01-02 DIAGNOSIS — Z79891 Long term (current) use of opiate analgesic: Secondary | ICD-10-CM | POA: Diagnosis not present

## 2017-01-02 DIAGNOSIS — I110 Hypertensive heart disease with heart failure: Secondary | ICD-10-CM | POA: Diagnosis not present

## 2017-01-02 DIAGNOSIS — I5032 Chronic diastolic (congestive) heart failure: Secondary | ICD-10-CM | POA: Diagnosis not present

## 2017-01-02 DIAGNOSIS — M5441 Lumbago with sciatica, right side: Secondary | ICD-10-CM | POA: Diagnosis not present

## 2017-01-03 DIAGNOSIS — M5441 Lumbago with sciatica, right side: Secondary | ICD-10-CM | POA: Diagnosis not present

## 2017-01-03 DIAGNOSIS — Z79891 Long term (current) use of opiate analgesic: Secondary | ICD-10-CM | POA: Diagnosis not present

## 2017-01-03 DIAGNOSIS — Z7901 Long term (current) use of anticoagulants: Secondary | ICD-10-CM | POA: Diagnosis not present

## 2017-01-03 DIAGNOSIS — I5032 Chronic diastolic (congestive) heart failure: Secondary | ICD-10-CM | POA: Diagnosis not present

## 2017-01-03 DIAGNOSIS — E1042 Type 1 diabetes mellitus with diabetic polyneuropathy: Secondary | ICD-10-CM | POA: Diagnosis not present

## 2017-01-03 DIAGNOSIS — I110 Hypertensive heart disease with heart failure: Secondary | ICD-10-CM | POA: Diagnosis not present

## 2017-01-03 DIAGNOSIS — H35039 Hypertensive retinopathy, unspecified eye: Secondary | ICD-10-CM | POA: Diagnosis not present

## 2017-01-03 DIAGNOSIS — M5442 Lumbago with sciatica, left side: Secondary | ICD-10-CM | POA: Diagnosis not present

## 2017-01-07 DIAGNOSIS — M5441 Lumbago with sciatica, right side: Secondary | ICD-10-CM | POA: Diagnosis not present

## 2017-01-07 DIAGNOSIS — Z79891 Long term (current) use of opiate analgesic: Secondary | ICD-10-CM | POA: Diagnosis not present

## 2017-01-07 DIAGNOSIS — I5032 Chronic diastolic (congestive) heart failure: Secondary | ICD-10-CM | POA: Diagnosis not present

## 2017-01-07 DIAGNOSIS — H35039 Hypertensive retinopathy, unspecified eye: Secondary | ICD-10-CM | POA: Diagnosis not present

## 2017-01-07 DIAGNOSIS — M5442 Lumbago with sciatica, left side: Secondary | ICD-10-CM | POA: Diagnosis not present

## 2017-01-07 DIAGNOSIS — E1042 Type 1 diabetes mellitus with diabetic polyneuropathy: Secondary | ICD-10-CM | POA: Diagnosis not present

## 2017-01-07 DIAGNOSIS — Z7901 Long term (current) use of anticoagulants: Secondary | ICD-10-CM | POA: Diagnosis not present

## 2017-01-07 DIAGNOSIS — I110 Hypertensive heart disease with heart failure: Secondary | ICD-10-CM | POA: Diagnosis not present

## 2017-01-09 DIAGNOSIS — I110 Hypertensive heart disease with heart failure: Secondary | ICD-10-CM | POA: Diagnosis not present

## 2017-01-09 DIAGNOSIS — M5442 Lumbago with sciatica, left side: Secondary | ICD-10-CM | POA: Diagnosis not present

## 2017-01-09 DIAGNOSIS — Z79891 Long term (current) use of opiate analgesic: Secondary | ICD-10-CM | POA: Diagnosis not present

## 2017-01-09 DIAGNOSIS — H35039 Hypertensive retinopathy, unspecified eye: Secondary | ICD-10-CM | POA: Diagnosis not present

## 2017-01-09 DIAGNOSIS — E1042 Type 1 diabetes mellitus with diabetic polyneuropathy: Secondary | ICD-10-CM | POA: Diagnosis not present

## 2017-01-09 DIAGNOSIS — M5441 Lumbago with sciatica, right side: Secondary | ICD-10-CM | POA: Diagnosis not present

## 2017-01-09 DIAGNOSIS — I5032 Chronic diastolic (congestive) heart failure: Secondary | ICD-10-CM | POA: Diagnosis not present

## 2017-01-09 DIAGNOSIS — Z7901 Long term (current) use of anticoagulants: Secondary | ICD-10-CM | POA: Diagnosis not present

## 2017-01-14 DIAGNOSIS — M5441 Lumbago with sciatica, right side: Secondary | ICD-10-CM | POA: Diagnosis not present

## 2017-01-14 DIAGNOSIS — M5442 Lumbago with sciatica, left side: Secondary | ICD-10-CM | POA: Diagnosis not present

## 2017-01-14 DIAGNOSIS — I5032 Chronic diastolic (congestive) heart failure: Secondary | ICD-10-CM | POA: Diagnosis not present

## 2017-01-14 DIAGNOSIS — E1042 Type 1 diabetes mellitus with diabetic polyneuropathy: Secondary | ICD-10-CM | POA: Diagnosis not present

## 2017-01-14 DIAGNOSIS — Z7901 Long term (current) use of anticoagulants: Secondary | ICD-10-CM | POA: Diagnosis not present

## 2017-01-14 DIAGNOSIS — Z79891 Long term (current) use of opiate analgesic: Secondary | ICD-10-CM | POA: Diagnosis not present

## 2017-01-14 DIAGNOSIS — I110 Hypertensive heart disease with heart failure: Secondary | ICD-10-CM | POA: Diagnosis not present

## 2017-01-14 DIAGNOSIS — H35039 Hypertensive retinopathy, unspecified eye: Secondary | ICD-10-CM | POA: Diagnosis not present

## 2017-01-16 DIAGNOSIS — M5441 Lumbago with sciatica, right side: Secondary | ICD-10-CM | POA: Diagnosis not present

## 2017-01-16 DIAGNOSIS — M5442 Lumbago with sciatica, left side: Secondary | ICD-10-CM | POA: Diagnosis not present

## 2017-01-16 DIAGNOSIS — I5032 Chronic diastolic (congestive) heart failure: Secondary | ICD-10-CM | POA: Diagnosis not present

## 2017-01-16 DIAGNOSIS — H35039 Hypertensive retinopathy, unspecified eye: Secondary | ICD-10-CM | POA: Diagnosis not present

## 2017-01-16 DIAGNOSIS — Z7901 Long term (current) use of anticoagulants: Secondary | ICD-10-CM | POA: Diagnosis not present

## 2017-01-16 DIAGNOSIS — I110 Hypertensive heart disease with heart failure: Secondary | ICD-10-CM | POA: Diagnosis not present

## 2017-01-16 DIAGNOSIS — E1042 Type 1 diabetes mellitus with diabetic polyneuropathy: Secondary | ICD-10-CM | POA: Diagnosis not present

## 2017-01-16 DIAGNOSIS — Z79891 Long term (current) use of opiate analgesic: Secondary | ICD-10-CM | POA: Diagnosis not present

## 2017-01-17 DIAGNOSIS — I110 Hypertensive heart disease with heart failure: Secondary | ICD-10-CM | POA: Diagnosis not present

## 2017-01-17 DIAGNOSIS — I5032 Chronic diastolic (congestive) heart failure: Secondary | ICD-10-CM | POA: Diagnosis not present

## 2017-01-17 DIAGNOSIS — M5442 Lumbago with sciatica, left side: Secondary | ICD-10-CM | POA: Diagnosis not present

## 2017-01-17 DIAGNOSIS — Z7901 Long term (current) use of anticoagulants: Secondary | ICD-10-CM | POA: Diagnosis not present

## 2017-01-17 DIAGNOSIS — H35039 Hypertensive retinopathy, unspecified eye: Secondary | ICD-10-CM | POA: Diagnosis not present

## 2017-01-17 DIAGNOSIS — Z79891 Long term (current) use of opiate analgesic: Secondary | ICD-10-CM | POA: Diagnosis not present

## 2017-01-17 DIAGNOSIS — E1042 Type 1 diabetes mellitus with diabetic polyneuropathy: Secondary | ICD-10-CM | POA: Diagnosis not present

## 2017-01-17 DIAGNOSIS — M5441 Lumbago with sciatica, right side: Secondary | ICD-10-CM | POA: Diagnosis not present

## 2017-01-21 DIAGNOSIS — E1042 Type 1 diabetes mellitus with diabetic polyneuropathy: Secondary | ICD-10-CM | POA: Diagnosis not present

## 2017-01-21 DIAGNOSIS — M5442 Lumbago with sciatica, left side: Secondary | ICD-10-CM | POA: Diagnosis not present

## 2017-01-21 DIAGNOSIS — M5441 Lumbago with sciatica, right side: Secondary | ICD-10-CM | POA: Diagnosis not present

## 2017-01-21 DIAGNOSIS — Z79891 Long term (current) use of opiate analgesic: Secondary | ICD-10-CM | POA: Diagnosis not present

## 2017-01-21 DIAGNOSIS — Z7901 Long term (current) use of anticoagulants: Secondary | ICD-10-CM | POA: Diagnosis not present

## 2017-01-21 DIAGNOSIS — H35039 Hypertensive retinopathy, unspecified eye: Secondary | ICD-10-CM | POA: Diagnosis not present

## 2017-01-21 DIAGNOSIS — I110 Hypertensive heart disease with heart failure: Secondary | ICD-10-CM | POA: Diagnosis not present

## 2017-01-21 DIAGNOSIS — I5032 Chronic diastolic (congestive) heart failure: Secondary | ICD-10-CM | POA: Diagnosis not present

## 2017-01-23 DIAGNOSIS — Z79891 Long term (current) use of opiate analgesic: Secondary | ICD-10-CM | POA: Diagnosis not present

## 2017-01-23 DIAGNOSIS — M5442 Lumbago with sciatica, left side: Secondary | ICD-10-CM | POA: Diagnosis not present

## 2017-01-23 DIAGNOSIS — E1042 Type 1 diabetes mellitus with diabetic polyneuropathy: Secondary | ICD-10-CM | POA: Diagnosis not present

## 2017-01-23 DIAGNOSIS — I5032 Chronic diastolic (congestive) heart failure: Secondary | ICD-10-CM | POA: Diagnosis not present

## 2017-01-23 DIAGNOSIS — H35039 Hypertensive retinopathy, unspecified eye: Secondary | ICD-10-CM | POA: Diagnosis not present

## 2017-01-23 DIAGNOSIS — I110 Hypertensive heart disease with heart failure: Secondary | ICD-10-CM | POA: Diagnosis not present

## 2017-01-23 DIAGNOSIS — M5441 Lumbago with sciatica, right side: Secondary | ICD-10-CM | POA: Diagnosis not present

## 2017-01-23 DIAGNOSIS — Z7901 Long term (current) use of anticoagulants: Secondary | ICD-10-CM | POA: Diagnosis not present

## 2017-01-24 DIAGNOSIS — Z79891 Long term (current) use of opiate analgesic: Secondary | ICD-10-CM | POA: Diagnosis not present

## 2017-01-24 DIAGNOSIS — M5441 Lumbago with sciatica, right side: Secondary | ICD-10-CM | POA: Diagnosis not present

## 2017-01-24 DIAGNOSIS — H35039 Hypertensive retinopathy, unspecified eye: Secondary | ICD-10-CM | POA: Diagnosis not present

## 2017-01-24 DIAGNOSIS — I110 Hypertensive heart disease with heart failure: Secondary | ICD-10-CM | POA: Diagnosis not present

## 2017-01-24 DIAGNOSIS — Z7901 Long term (current) use of anticoagulants: Secondary | ICD-10-CM | POA: Diagnosis not present

## 2017-01-24 DIAGNOSIS — M5442 Lumbago with sciatica, left side: Secondary | ICD-10-CM | POA: Diagnosis not present

## 2017-01-24 DIAGNOSIS — E1042 Type 1 diabetes mellitus with diabetic polyneuropathy: Secondary | ICD-10-CM | POA: Diagnosis not present

## 2017-01-24 DIAGNOSIS — I5032 Chronic diastolic (congestive) heart failure: Secondary | ICD-10-CM | POA: Diagnosis not present

## 2017-01-28 DIAGNOSIS — I5032 Chronic diastolic (congestive) heart failure: Secondary | ICD-10-CM | POA: Diagnosis not present

## 2017-01-28 DIAGNOSIS — Z79891 Long term (current) use of opiate analgesic: Secondary | ICD-10-CM | POA: Diagnosis not present

## 2017-01-28 DIAGNOSIS — E1042 Type 1 diabetes mellitus with diabetic polyneuropathy: Secondary | ICD-10-CM | POA: Diagnosis not present

## 2017-01-28 DIAGNOSIS — M5442 Lumbago with sciatica, left side: Secondary | ICD-10-CM | POA: Diagnosis not present

## 2017-01-28 DIAGNOSIS — H35039 Hypertensive retinopathy, unspecified eye: Secondary | ICD-10-CM | POA: Diagnosis not present

## 2017-01-28 DIAGNOSIS — I110 Hypertensive heart disease with heart failure: Secondary | ICD-10-CM | POA: Diagnosis not present

## 2017-01-28 DIAGNOSIS — Z7901 Long term (current) use of anticoagulants: Secondary | ICD-10-CM | POA: Diagnosis not present

## 2017-01-28 DIAGNOSIS — M5441 Lumbago with sciatica, right side: Secondary | ICD-10-CM | POA: Diagnosis not present

## 2017-01-29 DIAGNOSIS — M5441 Lumbago with sciatica, right side: Secondary | ICD-10-CM | POA: Diagnosis not present

## 2017-01-29 DIAGNOSIS — I110 Hypertensive heart disease with heart failure: Secondary | ICD-10-CM | POA: Diagnosis not present

## 2017-01-29 DIAGNOSIS — M5442 Lumbago with sciatica, left side: Secondary | ICD-10-CM | POA: Diagnosis not present

## 2017-01-29 DIAGNOSIS — I5032 Chronic diastolic (congestive) heart failure: Secondary | ICD-10-CM | POA: Diagnosis not present

## 2017-01-29 DIAGNOSIS — E1042 Type 1 diabetes mellitus with diabetic polyneuropathy: Secondary | ICD-10-CM | POA: Diagnosis not present

## 2017-01-29 DIAGNOSIS — H35039 Hypertensive retinopathy, unspecified eye: Secondary | ICD-10-CM | POA: Diagnosis not present

## 2017-01-29 DIAGNOSIS — Z7901 Long term (current) use of anticoagulants: Secondary | ICD-10-CM | POA: Diagnosis not present

## 2017-01-29 DIAGNOSIS — Z79891 Long term (current) use of opiate analgesic: Secondary | ICD-10-CM | POA: Diagnosis not present

## 2017-01-30 DIAGNOSIS — M5441 Lumbago with sciatica, right side: Secondary | ICD-10-CM | POA: Diagnosis not present

## 2017-01-30 DIAGNOSIS — H35039 Hypertensive retinopathy, unspecified eye: Secondary | ICD-10-CM | POA: Diagnosis not present

## 2017-01-30 DIAGNOSIS — Z7901 Long term (current) use of anticoagulants: Secondary | ICD-10-CM | POA: Diagnosis not present

## 2017-01-30 DIAGNOSIS — E1042 Type 1 diabetes mellitus with diabetic polyneuropathy: Secondary | ICD-10-CM | POA: Diagnosis not present

## 2017-01-30 DIAGNOSIS — I5032 Chronic diastolic (congestive) heart failure: Secondary | ICD-10-CM | POA: Diagnosis not present

## 2017-01-30 DIAGNOSIS — I110 Hypertensive heart disease with heart failure: Secondary | ICD-10-CM | POA: Diagnosis not present

## 2017-01-30 DIAGNOSIS — Z79891 Long term (current) use of opiate analgesic: Secondary | ICD-10-CM | POA: Diagnosis not present

## 2017-01-30 DIAGNOSIS — M5442 Lumbago with sciatica, left side: Secondary | ICD-10-CM | POA: Diagnosis not present

## 2017-01-31 DIAGNOSIS — M5442 Lumbago with sciatica, left side: Secondary | ICD-10-CM | POA: Diagnosis not present

## 2017-01-31 DIAGNOSIS — I110 Hypertensive heart disease with heart failure: Secondary | ICD-10-CM | POA: Diagnosis not present

## 2017-01-31 DIAGNOSIS — M5441 Lumbago with sciatica, right side: Secondary | ICD-10-CM | POA: Diagnosis not present

## 2017-01-31 DIAGNOSIS — H35039 Hypertensive retinopathy, unspecified eye: Secondary | ICD-10-CM | POA: Diagnosis not present

## 2017-01-31 DIAGNOSIS — Z79891 Long term (current) use of opiate analgesic: Secondary | ICD-10-CM | POA: Diagnosis not present

## 2017-01-31 DIAGNOSIS — I5032 Chronic diastolic (congestive) heart failure: Secondary | ICD-10-CM | POA: Diagnosis not present

## 2017-01-31 DIAGNOSIS — Z7901 Long term (current) use of anticoagulants: Secondary | ICD-10-CM | POA: Diagnosis not present

## 2017-01-31 DIAGNOSIS — E1042 Type 1 diabetes mellitus with diabetic polyneuropathy: Secondary | ICD-10-CM | POA: Diagnosis not present

## 2017-02-04 DIAGNOSIS — I110 Hypertensive heart disease with heart failure: Secondary | ICD-10-CM | POA: Diagnosis not present

## 2017-02-04 DIAGNOSIS — M5442 Lumbago with sciatica, left side: Secondary | ICD-10-CM | POA: Diagnosis not present

## 2017-02-04 DIAGNOSIS — H35039 Hypertensive retinopathy, unspecified eye: Secondary | ICD-10-CM | POA: Diagnosis not present

## 2017-02-04 DIAGNOSIS — E1042 Type 1 diabetes mellitus with diabetic polyneuropathy: Secondary | ICD-10-CM | POA: Diagnosis not present

## 2017-02-04 DIAGNOSIS — M5441 Lumbago with sciatica, right side: Secondary | ICD-10-CM | POA: Diagnosis not present

## 2017-02-04 DIAGNOSIS — Z79891 Long term (current) use of opiate analgesic: Secondary | ICD-10-CM | POA: Diagnosis not present

## 2017-02-04 DIAGNOSIS — Z7901 Long term (current) use of anticoagulants: Secondary | ICD-10-CM | POA: Diagnosis not present

## 2017-02-04 DIAGNOSIS — I5032 Chronic diastolic (congestive) heart failure: Secondary | ICD-10-CM | POA: Diagnosis not present

## 2017-02-05 DIAGNOSIS — Z79891 Long term (current) use of opiate analgesic: Secondary | ICD-10-CM | POA: Diagnosis not present

## 2017-02-05 DIAGNOSIS — I5032 Chronic diastolic (congestive) heart failure: Secondary | ICD-10-CM | POA: Diagnosis not present

## 2017-02-05 DIAGNOSIS — H35039 Hypertensive retinopathy, unspecified eye: Secondary | ICD-10-CM | POA: Diagnosis not present

## 2017-02-05 DIAGNOSIS — M5441 Lumbago with sciatica, right side: Secondary | ICD-10-CM | POA: Diagnosis not present

## 2017-02-05 DIAGNOSIS — E1042 Type 1 diabetes mellitus with diabetic polyneuropathy: Secondary | ICD-10-CM | POA: Diagnosis not present

## 2017-02-05 DIAGNOSIS — Z7901 Long term (current) use of anticoagulants: Secondary | ICD-10-CM | POA: Diagnosis not present

## 2017-02-05 DIAGNOSIS — I110 Hypertensive heart disease with heart failure: Secondary | ICD-10-CM | POA: Diagnosis not present

## 2017-02-05 DIAGNOSIS — M5442 Lumbago with sciatica, left side: Secondary | ICD-10-CM | POA: Diagnosis not present

## 2017-02-06 DIAGNOSIS — M5441 Lumbago with sciatica, right side: Secondary | ICD-10-CM | POA: Diagnosis not present

## 2017-02-06 DIAGNOSIS — Z7901 Long term (current) use of anticoagulants: Secondary | ICD-10-CM | POA: Diagnosis not present

## 2017-02-06 DIAGNOSIS — I10 Essential (primary) hypertension: Secondary | ICD-10-CM | POA: Diagnosis not present

## 2017-02-06 DIAGNOSIS — Z79891 Long term (current) use of opiate analgesic: Secondary | ICD-10-CM | POA: Diagnosis not present

## 2017-02-06 DIAGNOSIS — I5032 Chronic diastolic (congestive) heart failure: Secondary | ICD-10-CM | POA: Diagnosis not present

## 2017-02-06 DIAGNOSIS — Z0000 Encounter for general adult medical examination without abnormal findings: Secondary | ICD-10-CM | POA: Diagnosis not present

## 2017-02-06 DIAGNOSIS — E1042 Type 1 diabetes mellitus with diabetic polyneuropathy: Secondary | ICD-10-CM | POA: Diagnosis not present

## 2017-02-06 DIAGNOSIS — E7800 Pure hypercholesterolemia, unspecified: Secondary | ICD-10-CM | POA: Diagnosis not present

## 2017-02-06 DIAGNOSIS — I110 Hypertensive heart disease with heart failure: Secondary | ICD-10-CM | POA: Diagnosis not present

## 2017-02-06 DIAGNOSIS — H35039 Hypertensive retinopathy, unspecified eye: Secondary | ICD-10-CM | POA: Diagnosis not present

## 2017-02-06 DIAGNOSIS — I5022 Chronic systolic (congestive) heart failure: Secondary | ICD-10-CM | POA: Diagnosis not present

## 2017-02-06 DIAGNOSIS — M5442 Lumbago with sciatica, left side: Secondary | ICD-10-CM | POA: Diagnosis not present

## 2017-02-06 DIAGNOSIS — E119 Type 2 diabetes mellitus without complications: Secondary | ICD-10-CM | POA: Diagnosis not present

## 2017-02-07 DIAGNOSIS — M5442 Lumbago with sciatica, left side: Secondary | ICD-10-CM | POA: Diagnosis not present

## 2017-02-07 DIAGNOSIS — H35039 Hypertensive retinopathy, unspecified eye: Secondary | ICD-10-CM | POA: Diagnosis not present

## 2017-02-07 DIAGNOSIS — Z79891 Long term (current) use of opiate analgesic: Secondary | ICD-10-CM | POA: Diagnosis not present

## 2017-02-07 DIAGNOSIS — M5441 Lumbago with sciatica, right side: Secondary | ICD-10-CM | POA: Diagnosis not present

## 2017-02-07 DIAGNOSIS — E1042 Type 1 diabetes mellitus with diabetic polyneuropathy: Secondary | ICD-10-CM | POA: Diagnosis not present

## 2017-02-07 DIAGNOSIS — I110 Hypertensive heart disease with heart failure: Secondary | ICD-10-CM | POA: Diagnosis not present

## 2017-02-07 DIAGNOSIS — I5032 Chronic diastolic (congestive) heart failure: Secondary | ICD-10-CM | POA: Diagnosis not present

## 2017-02-07 DIAGNOSIS — Z7901 Long term (current) use of anticoagulants: Secondary | ICD-10-CM | POA: Diagnosis not present

## 2017-02-11 DIAGNOSIS — M5441 Lumbago with sciatica, right side: Secondary | ICD-10-CM | POA: Diagnosis not present

## 2017-02-11 DIAGNOSIS — M5442 Lumbago with sciatica, left side: Secondary | ICD-10-CM | POA: Diagnosis not present

## 2017-02-11 DIAGNOSIS — Z79891 Long term (current) use of opiate analgesic: Secondary | ICD-10-CM | POA: Diagnosis not present

## 2017-02-11 DIAGNOSIS — I110 Hypertensive heart disease with heart failure: Secondary | ICD-10-CM | POA: Diagnosis not present

## 2017-02-11 DIAGNOSIS — E1042 Type 1 diabetes mellitus with diabetic polyneuropathy: Secondary | ICD-10-CM | POA: Diagnosis not present

## 2017-02-11 DIAGNOSIS — I5032 Chronic diastolic (congestive) heart failure: Secondary | ICD-10-CM | POA: Diagnosis not present

## 2017-02-11 DIAGNOSIS — H35039 Hypertensive retinopathy, unspecified eye: Secondary | ICD-10-CM | POA: Diagnosis not present

## 2017-02-11 DIAGNOSIS — Z7901 Long term (current) use of anticoagulants: Secondary | ICD-10-CM | POA: Diagnosis not present

## 2017-02-13 DIAGNOSIS — E1042 Type 1 diabetes mellitus with diabetic polyneuropathy: Secondary | ICD-10-CM | POA: Diagnosis not present

## 2017-02-13 DIAGNOSIS — H35039 Hypertensive retinopathy, unspecified eye: Secondary | ICD-10-CM | POA: Diagnosis not present

## 2017-02-13 DIAGNOSIS — M5441 Lumbago with sciatica, right side: Secondary | ICD-10-CM | POA: Diagnosis not present

## 2017-02-13 DIAGNOSIS — I110 Hypertensive heart disease with heart failure: Secondary | ICD-10-CM | POA: Diagnosis not present

## 2017-02-13 DIAGNOSIS — Z79891 Long term (current) use of opiate analgesic: Secondary | ICD-10-CM | POA: Diagnosis not present

## 2017-02-13 DIAGNOSIS — Z7901 Long term (current) use of anticoagulants: Secondary | ICD-10-CM | POA: Diagnosis not present

## 2017-02-13 DIAGNOSIS — I5032 Chronic diastolic (congestive) heart failure: Secondary | ICD-10-CM | POA: Diagnosis not present

## 2017-02-13 DIAGNOSIS — M5442 Lumbago with sciatica, left side: Secondary | ICD-10-CM | POA: Diagnosis not present

## 2017-02-14 DIAGNOSIS — I5032 Chronic diastolic (congestive) heart failure: Secondary | ICD-10-CM | POA: Diagnosis not present

## 2017-02-14 DIAGNOSIS — I110 Hypertensive heart disease with heart failure: Secondary | ICD-10-CM | POA: Diagnosis not present

## 2017-02-14 DIAGNOSIS — E1042 Type 1 diabetes mellitus with diabetic polyneuropathy: Secondary | ICD-10-CM | POA: Diagnosis not present

## 2017-02-14 DIAGNOSIS — Z79891 Long term (current) use of opiate analgesic: Secondary | ICD-10-CM | POA: Diagnosis not present

## 2017-02-14 DIAGNOSIS — H35039 Hypertensive retinopathy, unspecified eye: Secondary | ICD-10-CM | POA: Diagnosis not present

## 2017-02-14 DIAGNOSIS — Z7901 Long term (current) use of anticoagulants: Secondary | ICD-10-CM | POA: Diagnosis not present

## 2017-02-14 DIAGNOSIS — M5441 Lumbago with sciatica, right side: Secondary | ICD-10-CM | POA: Diagnosis not present

## 2017-02-14 DIAGNOSIS — M5442 Lumbago with sciatica, left side: Secondary | ICD-10-CM | POA: Diagnosis not present

## 2017-02-18 DIAGNOSIS — Z79891 Long term (current) use of opiate analgesic: Secondary | ICD-10-CM | POA: Diagnosis not present

## 2017-02-18 DIAGNOSIS — I5032 Chronic diastolic (congestive) heart failure: Secondary | ICD-10-CM | POA: Diagnosis not present

## 2017-02-18 DIAGNOSIS — H35039 Hypertensive retinopathy, unspecified eye: Secondary | ICD-10-CM | POA: Diagnosis not present

## 2017-02-18 DIAGNOSIS — E1042 Type 1 diabetes mellitus with diabetic polyneuropathy: Secondary | ICD-10-CM | POA: Diagnosis not present

## 2017-02-18 DIAGNOSIS — I110 Hypertensive heart disease with heart failure: Secondary | ICD-10-CM | POA: Diagnosis not present

## 2017-02-18 DIAGNOSIS — M5442 Lumbago with sciatica, left side: Secondary | ICD-10-CM | POA: Diagnosis not present

## 2017-02-18 DIAGNOSIS — M5441 Lumbago with sciatica, right side: Secondary | ICD-10-CM | POA: Diagnosis not present

## 2017-02-18 DIAGNOSIS — Z7901 Long term (current) use of anticoagulants: Secondary | ICD-10-CM | POA: Diagnosis not present

## 2017-02-21 DIAGNOSIS — I5032 Chronic diastolic (congestive) heart failure: Secondary | ICD-10-CM | POA: Diagnosis not present

## 2017-02-21 DIAGNOSIS — M5442 Lumbago with sciatica, left side: Secondary | ICD-10-CM | POA: Diagnosis not present

## 2017-02-21 DIAGNOSIS — H35039 Hypertensive retinopathy, unspecified eye: Secondary | ICD-10-CM | POA: Diagnosis not present

## 2017-02-21 DIAGNOSIS — E1042 Type 1 diabetes mellitus with diabetic polyneuropathy: Secondary | ICD-10-CM | POA: Diagnosis not present

## 2017-02-21 DIAGNOSIS — I110 Hypertensive heart disease with heart failure: Secondary | ICD-10-CM | POA: Diagnosis not present

## 2017-02-21 DIAGNOSIS — Z7901 Long term (current) use of anticoagulants: Secondary | ICD-10-CM | POA: Diagnosis not present

## 2017-02-21 DIAGNOSIS — M5441 Lumbago with sciatica, right side: Secondary | ICD-10-CM | POA: Diagnosis not present

## 2017-02-21 DIAGNOSIS — Z79891 Long term (current) use of opiate analgesic: Secondary | ICD-10-CM | POA: Diagnosis not present

## 2017-02-25 DIAGNOSIS — E1042 Type 1 diabetes mellitus with diabetic polyneuropathy: Secondary | ICD-10-CM | POA: Diagnosis not present

## 2017-02-25 DIAGNOSIS — I110 Hypertensive heart disease with heart failure: Secondary | ICD-10-CM | POA: Diagnosis not present

## 2017-02-25 DIAGNOSIS — H35039 Hypertensive retinopathy, unspecified eye: Secondary | ICD-10-CM | POA: Diagnosis not present

## 2017-02-25 DIAGNOSIS — M5442 Lumbago with sciatica, left side: Secondary | ICD-10-CM | POA: Diagnosis not present

## 2017-02-25 DIAGNOSIS — Z7901 Long term (current) use of anticoagulants: Secondary | ICD-10-CM | POA: Diagnosis not present

## 2017-02-25 DIAGNOSIS — I5032 Chronic diastolic (congestive) heart failure: Secondary | ICD-10-CM | POA: Diagnosis not present

## 2017-02-25 DIAGNOSIS — Z79891 Long term (current) use of opiate analgesic: Secondary | ICD-10-CM | POA: Diagnosis not present

## 2017-02-25 DIAGNOSIS — M5441 Lumbago with sciatica, right side: Secondary | ICD-10-CM | POA: Diagnosis not present

## 2017-02-27 DIAGNOSIS — Z79891 Long term (current) use of opiate analgesic: Secondary | ICD-10-CM | POA: Diagnosis not present

## 2017-02-27 DIAGNOSIS — M5442 Lumbago with sciatica, left side: Secondary | ICD-10-CM | POA: Diagnosis not present

## 2017-02-27 DIAGNOSIS — I5032 Chronic diastolic (congestive) heart failure: Secondary | ICD-10-CM | POA: Diagnosis not present

## 2017-02-27 DIAGNOSIS — I110 Hypertensive heart disease with heart failure: Secondary | ICD-10-CM | POA: Diagnosis not present

## 2017-02-27 DIAGNOSIS — H35039 Hypertensive retinopathy, unspecified eye: Secondary | ICD-10-CM | POA: Diagnosis not present

## 2017-02-27 DIAGNOSIS — M5441 Lumbago with sciatica, right side: Secondary | ICD-10-CM | POA: Diagnosis not present

## 2017-02-27 DIAGNOSIS — E1042 Type 1 diabetes mellitus with diabetic polyneuropathy: Secondary | ICD-10-CM | POA: Diagnosis not present

## 2017-02-27 DIAGNOSIS — Z7901 Long term (current) use of anticoagulants: Secondary | ICD-10-CM | POA: Diagnosis not present

## 2017-02-28 DIAGNOSIS — I5032 Chronic diastolic (congestive) heart failure: Secondary | ICD-10-CM | POA: Diagnosis not present

## 2017-02-28 DIAGNOSIS — H35039 Hypertensive retinopathy, unspecified eye: Secondary | ICD-10-CM | POA: Diagnosis not present

## 2017-02-28 DIAGNOSIS — E1042 Type 1 diabetes mellitus with diabetic polyneuropathy: Secondary | ICD-10-CM | POA: Diagnosis not present

## 2017-02-28 DIAGNOSIS — Z79891 Long term (current) use of opiate analgesic: Secondary | ICD-10-CM | POA: Diagnosis not present

## 2017-02-28 DIAGNOSIS — M5441 Lumbago with sciatica, right side: Secondary | ICD-10-CM | POA: Diagnosis not present

## 2017-02-28 DIAGNOSIS — I110 Hypertensive heart disease with heart failure: Secondary | ICD-10-CM | POA: Diagnosis not present

## 2017-02-28 DIAGNOSIS — Z7901 Long term (current) use of anticoagulants: Secondary | ICD-10-CM | POA: Diagnosis not present

## 2017-02-28 DIAGNOSIS — M5442 Lumbago with sciatica, left side: Secondary | ICD-10-CM | POA: Diagnosis not present

## 2017-03-04 DIAGNOSIS — H35039 Hypertensive retinopathy, unspecified eye: Secondary | ICD-10-CM | POA: Diagnosis not present

## 2017-03-04 DIAGNOSIS — I5032 Chronic diastolic (congestive) heart failure: Secondary | ICD-10-CM | POA: Diagnosis not present

## 2017-03-04 DIAGNOSIS — I110 Hypertensive heart disease with heart failure: Secondary | ICD-10-CM | POA: Diagnosis not present

## 2017-03-04 DIAGNOSIS — Z7901 Long term (current) use of anticoagulants: Secondary | ICD-10-CM | POA: Diagnosis not present

## 2017-03-04 DIAGNOSIS — Z79891 Long term (current) use of opiate analgesic: Secondary | ICD-10-CM | POA: Diagnosis not present

## 2017-03-04 DIAGNOSIS — M5442 Lumbago with sciatica, left side: Secondary | ICD-10-CM | POA: Diagnosis not present

## 2017-03-04 DIAGNOSIS — M5441 Lumbago with sciatica, right side: Secondary | ICD-10-CM | POA: Diagnosis not present

## 2017-03-04 DIAGNOSIS — E1042 Type 1 diabetes mellitus with diabetic polyneuropathy: Secondary | ICD-10-CM | POA: Diagnosis not present

## 2017-03-06 DIAGNOSIS — I5032 Chronic diastolic (congestive) heart failure: Secondary | ICD-10-CM | POA: Diagnosis not present

## 2017-03-06 DIAGNOSIS — M5442 Lumbago with sciatica, left side: Secondary | ICD-10-CM | POA: Diagnosis not present

## 2017-03-06 DIAGNOSIS — I110 Hypertensive heart disease with heart failure: Secondary | ICD-10-CM | POA: Diagnosis not present

## 2017-03-06 DIAGNOSIS — M5441 Lumbago with sciatica, right side: Secondary | ICD-10-CM | POA: Diagnosis not present

## 2017-03-06 DIAGNOSIS — Z79891 Long term (current) use of opiate analgesic: Secondary | ICD-10-CM | POA: Diagnosis not present

## 2017-03-06 DIAGNOSIS — H35039 Hypertensive retinopathy, unspecified eye: Secondary | ICD-10-CM | POA: Diagnosis not present

## 2017-03-06 DIAGNOSIS — E1042 Type 1 diabetes mellitus with diabetic polyneuropathy: Secondary | ICD-10-CM | POA: Diagnosis not present

## 2017-03-06 DIAGNOSIS — Z7901 Long term (current) use of anticoagulants: Secondary | ICD-10-CM | POA: Diagnosis not present

## 2017-03-07 DIAGNOSIS — Z79891 Long term (current) use of opiate analgesic: Secondary | ICD-10-CM | POA: Diagnosis not present

## 2017-03-07 DIAGNOSIS — I5032 Chronic diastolic (congestive) heart failure: Secondary | ICD-10-CM | POA: Diagnosis not present

## 2017-03-07 DIAGNOSIS — M5442 Lumbago with sciatica, left side: Secondary | ICD-10-CM | POA: Diagnosis not present

## 2017-03-07 DIAGNOSIS — I110 Hypertensive heart disease with heart failure: Secondary | ICD-10-CM | POA: Diagnosis not present

## 2017-03-07 DIAGNOSIS — Z7901 Long term (current) use of anticoagulants: Secondary | ICD-10-CM | POA: Diagnosis not present

## 2017-03-07 DIAGNOSIS — M5441 Lumbago with sciatica, right side: Secondary | ICD-10-CM | POA: Diagnosis not present

## 2017-03-07 DIAGNOSIS — E1042 Type 1 diabetes mellitus with diabetic polyneuropathy: Secondary | ICD-10-CM | POA: Diagnosis not present

## 2017-03-07 DIAGNOSIS — H35039 Hypertensive retinopathy, unspecified eye: Secondary | ICD-10-CM | POA: Diagnosis not present

## 2017-03-10 DIAGNOSIS — M5441 Lumbago with sciatica, right side: Secondary | ICD-10-CM | POA: Diagnosis not present

## 2017-03-10 DIAGNOSIS — I110 Hypertensive heart disease with heart failure: Secondary | ICD-10-CM | POA: Diagnosis not present

## 2017-03-10 DIAGNOSIS — Z7901 Long term (current) use of anticoagulants: Secondary | ICD-10-CM | POA: Diagnosis not present

## 2017-03-10 DIAGNOSIS — E1042 Type 1 diabetes mellitus with diabetic polyneuropathy: Secondary | ICD-10-CM | POA: Diagnosis not present

## 2017-03-10 DIAGNOSIS — M5442 Lumbago with sciatica, left side: Secondary | ICD-10-CM | POA: Diagnosis not present

## 2017-03-10 DIAGNOSIS — I5032 Chronic diastolic (congestive) heart failure: Secondary | ICD-10-CM | POA: Diagnosis not present

## 2017-03-10 DIAGNOSIS — Z79891 Long term (current) use of opiate analgesic: Secondary | ICD-10-CM | POA: Diagnosis not present

## 2017-03-10 DIAGNOSIS — H35039 Hypertensive retinopathy, unspecified eye: Secondary | ICD-10-CM | POA: Diagnosis not present

## 2017-03-13 DIAGNOSIS — I110 Hypertensive heart disease with heart failure: Secondary | ICD-10-CM | POA: Diagnosis not present

## 2017-03-13 DIAGNOSIS — Z7901 Long term (current) use of anticoagulants: Secondary | ICD-10-CM | POA: Diagnosis not present

## 2017-03-13 DIAGNOSIS — M5441 Lumbago with sciatica, right side: Secondary | ICD-10-CM | POA: Diagnosis not present

## 2017-03-13 DIAGNOSIS — E1042 Type 1 diabetes mellitus with diabetic polyneuropathy: Secondary | ICD-10-CM | POA: Diagnosis not present

## 2017-03-13 DIAGNOSIS — Z79891 Long term (current) use of opiate analgesic: Secondary | ICD-10-CM | POA: Diagnosis not present

## 2017-03-13 DIAGNOSIS — H35039 Hypertensive retinopathy, unspecified eye: Secondary | ICD-10-CM | POA: Diagnosis not present

## 2017-03-13 DIAGNOSIS — M5442 Lumbago with sciatica, left side: Secondary | ICD-10-CM | POA: Diagnosis not present

## 2017-03-13 DIAGNOSIS — I5032 Chronic diastolic (congestive) heart failure: Secondary | ICD-10-CM | POA: Diagnosis not present

## 2017-03-14 DIAGNOSIS — M5441 Lumbago with sciatica, right side: Secondary | ICD-10-CM | POA: Diagnosis not present

## 2017-03-14 DIAGNOSIS — I110 Hypertensive heart disease with heart failure: Secondary | ICD-10-CM | POA: Diagnosis not present

## 2017-03-14 DIAGNOSIS — I5032 Chronic diastolic (congestive) heart failure: Secondary | ICD-10-CM | POA: Diagnosis not present

## 2017-03-14 DIAGNOSIS — H35039 Hypertensive retinopathy, unspecified eye: Secondary | ICD-10-CM | POA: Diagnosis not present

## 2017-03-14 DIAGNOSIS — Z79891 Long term (current) use of opiate analgesic: Secondary | ICD-10-CM | POA: Diagnosis not present

## 2017-03-14 DIAGNOSIS — Z7901 Long term (current) use of anticoagulants: Secondary | ICD-10-CM | POA: Diagnosis not present

## 2017-03-14 DIAGNOSIS — M5442 Lumbago with sciatica, left side: Secondary | ICD-10-CM | POA: Diagnosis not present

## 2017-03-14 DIAGNOSIS — E1042 Type 1 diabetes mellitus with diabetic polyneuropathy: Secondary | ICD-10-CM | POA: Diagnosis not present

## 2017-03-18 DIAGNOSIS — Z7901 Long term (current) use of anticoagulants: Secondary | ICD-10-CM | POA: Diagnosis not present

## 2017-03-18 DIAGNOSIS — H35039 Hypertensive retinopathy, unspecified eye: Secondary | ICD-10-CM | POA: Diagnosis not present

## 2017-03-18 DIAGNOSIS — M5442 Lumbago with sciatica, left side: Secondary | ICD-10-CM | POA: Diagnosis not present

## 2017-03-18 DIAGNOSIS — Z79891 Long term (current) use of opiate analgesic: Secondary | ICD-10-CM | POA: Diagnosis not present

## 2017-03-18 DIAGNOSIS — M5441 Lumbago with sciatica, right side: Secondary | ICD-10-CM | POA: Diagnosis not present

## 2017-03-18 DIAGNOSIS — I5032 Chronic diastolic (congestive) heart failure: Secondary | ICD-10-CM | POA: Diagnosis not present

## 2017-03-18 DIAGNOSIS — E1042 Type 1 diabetes mellitus with diabetic polyneuropathy: Secondary | ICD-10-CM | POA: Diagnosis not present

## 2017-03-18 DIAGNOSIS — I110 Hypertensive heart disease with heart failure: Secondary | ICD-10-CM | POA: Diagnosis not present

## 2017-03-20 DIAGNOSIS — H35039 Hypertensive retinopathy, unspecified eye: Secondary | ICD-10-CM | POA: Diagnosis not present

## 2017-03-20 DIAGNOSIS — E1042 Type 1 diabetes mellitus with diabetic polyneuropathy: Secondary | ICD-10-CM | POA: Diagnosis not present

## 2017-03-20 DIAGNOSIS — I110 Hypertensive heart disease with heart failure: Secondary | ICD-10-CM | POA: Diagnosis not present

## 2017-03-20 DIAGNOSIS — M5442 Lumbago with sciatica, left side: Secondary | ICD-10-CM | POA: Diagnosis not present

## 2017-03-20 DIAGNOSIS — I5032 Chronic diastolic (congestive) heart failure: Secondary | ICD-10-CM | POA: Diagnosis not present

## 2017-03-20 DIAGNOSIS — M5441 Lumbago with sciatica, right side: Secondary | ICD-10-CM | POA: Diagnosis not present

## 2017-03-20 DIAGNOSIS — Z79891 Long term (current) use of opiate analgesic: Secondary | ICD-10-CM | POA: Diagnosis not present

## 2017-03-20 DIAGNOSIS — Z7901 Long term (current) use of anticoagulants: Secondary | ICD-10-CM | POA: Diagnosis not present

## 2017-03-21 DIAGNOSIS — I110 Hypertensive heart disease with heart failure: Secondary | ICD-10-CM | POA: Diagnosis not present

## 2017-03-21 DIAGNOSIS — Z7901 Long term (current) use of anticoagulants: Secondary | ICD-10-CM | POA: Diagnosis not present

## 2017-03-21 DIAGNOSIS — Z79891 Long term (current) use of opiate analgesic: Secondary | ICD-10-CM | POA: Diagnosis not present

## 2017-03-21 DIAGNOSIS — M5441 Lumbago with sciatica, right side: Secondary | ICD-10-CM | POA: Diagnosis not present

## 2017-03-21 DIAGNOSIS — H35039 Hypertensive retinopathy, unspecified eye: Secondary | ICD-10-CM | POA: Diagnosis not present

## 2017-03-21 DIAGNOSIS — I5032 Chronic diastolic (congestive) heart failure: Secondary | ICD-10-CM | POA: Diagnosis not present

## 2017-03-21 DIAGNOSIS — E1042 Type 1 diabetes mellitus with diabetic polyneuropathy: Secondary | ICD-10-CM | POA: Diagnosis not present

## 2017-03-21 DIAGNOSIS — M5442 Lumbago with sciatica, left side: Secondary | ICD-10-CM | POA: Diagnosis not present

## 2017-03-25 DIAGNOSIS — Z79891 Long term (current) use of opiate analgesic: Secondary | ICD-10-CM | POA: Diagnosis not present

## 2017-03-25 DIAGNOSIS — H35039 Hypertensive retinopathy, unspecified eye: Secondary | ICD-10-CM | POA: Diagnosis not present

## 2017-03-25 DIAGNOSIS — M5441 Lumbago with sciatica, right side: Secondary | ICD-10-CM | POA: Diagnosis not present

## 2017-03-25 DIAGNOSIS — M5442 Lumbago with sciatica, left side: Secondary | ICD-10-CM | POA: Diagnosis not present

## 2017-03-25 DIAGNOSIS — E1042 Type 1 diabetes mellitus with diabetic polyneuropathy: Secondary | ICD-10-CM | POA: Diagnosis not present

## 2017-03-25 DIAGNOSIS — I110 Hypertensive heart disease with heart failure: Secondary | ICD-10-CM | POA: Diagnosis not present

## 2017-03-25 DIAGNOSIS — Z7901 Long term (current) use of anticoagulants: Secondary | ICD-10-CM | POA: Diagnosis not present

## 2017-03-25 DIAGNOSIS — I5032 Chronic diastolic (congestive) heart failure: Secondary | ICD-10-CM | POA: Diagnosis not present

## 2017-03-26 DIAGNOSIS — M5441 Lumbago with sciatica, right side: Secondary | ICD-10-CM | POA: Diagnosis not present

## 2017-03-26 DIAGNOSIS — M5442 Lumbago with sciatica, left side: Secondary | ICD-10-CM | POA: Diagnosis not present

## 2017-03-26 DIAGNOSIS — E1042 Type 1 diabetes mellitus with diabetic polyneuropathy: Secondary | ICD-10-CM | POA: Diagnosis not present

## 2017-03-26 DIAGNOSIS — Z7901 Long term (current) use of anticoagulants: Secondary | ICD-10-CM | POA: Diagnosis not present

## 2017-03-26 DIAGNOSIS — H35039 Hypertensive retinopathy, unspecified eye: Secondary | ICD-10-CM | POA: Diagnosis not present

## 2017-03-26 DIAGNOSIS — I110 Hypertensive heart disease with heart failure: Secondary | ICD-10-CM | POA: Diagnosis not present

## 2017-03-26 DIAGNOSIS — I5032 Chronic diastolic (congestive) heart failure: Secondary | ICD-10-CM | POA: Diagnosis not present

## 2017-03-26 DIAGNOSIS — Z79891 Long term (current) use of opiate analgesic: Secondary | ICD-10-CM | POA: Diagnosis not present

## 2017-03-27 DIAGNOSIS — I5032 Chronic diastolic (congestive) heart failure: Secondary | ICD-10-CM | POA: Diagnosis not present

## 2017-03-27 DIAGNOSIS — E1042 Type 1 diabetes mellitus with diabetic polyneuropathy: Secondary | ICD-10-CM | POA: Diagnosis not present

## 2017-03-27 DIAGNOSIS — M5441 Lumbago with sciatica, right side: Secondary | ICD-10-CM | POA: Diagnosis not present

## 2017-03-27 DIAGNOSIS — Z79891 Long term (current) use of opiate analgesic: Secondary | ICD-10-CM | POA: Diagnosis not present

## 2017-03-27 DIAGNOSIS — M5442 Lumbago with sciatica, left side: Secondary | ICD-10-CM | POA: Diagnosis not present

## 2017-03-27 DIAGNOSIS — H35039 Hypertensive retinopathy, unspecified eye: Secondary | ICD-10-CM | POA: Diagnosis not present

## 2017-03-27 DIAGNOSIS — I110 Hypertensive heart disease with heart failure: Secondary | ICD-10-CM | POA: Diagnosis not present

## 2017-03-27 DIAGNOSIS — Z7901 Long term (current) use of anticoagulants: Secondary | ICD-10-CM | POA: Diagnosis not present

## 2017-03-28 DIAGNOSIS — Z7901 Long term (current) use of anticoagulants: Secondary | ICD-10-CM | POA: Diagnosis not present

## 2017-03-28 DIAGNOSIS — M5442 Lumbago with sciatica, left side: Secondary | ICD-10-CM | POA: Diagnosis not present

## 2017-03-28 DIAGNOSIS — H35039 Hypertensive retinopathy, unspecified eye: Secondary | ICD-10-CM | POA: Diagnosis not present

## 2017-03-28 DIAGNOSIS — M5441 Lumbago with sciatica, right side: Secondary | ICD-10-CM | POA: Diagnosis not present

## 2017-03-28 DIAGNOSIS — E1042 Type 1 diabetes mellitus with diabetic polyneuropathy: Secondary | ICD-10-CM | POA: Diagnosis not present

## 2017-03-28 DIAGNOSIS — Z79891 Long term (current) use of opiate analgesic: Secondary | ICD-10-CM | POA: Diagnosis not present

## 2017-03-28 DIAGNOSIS — I5032 Chronic diastolic (congestive) heart failure: Secondary | ICD-10-CM | POA: Diagnosis not present

## 2017-03-28 DIAGNOSIS — I110 Hypertensive heart disease with heart failure: Secondary | ICD-10-CM | POA: Diagnosis not present

## 2017-04-01 DIAGNOSIS — Z7901 Long term (current) use of anticoagulants: Secondary | ICD-10-CM | POA: Diagnosis not present

## 2017-04-01 DIAGNOSIS — M5442 Lumbago with sciatica, left side: Secondary | ICD-10-CM | POA: Diagnosis not present

## 2017-04-01 DIAGNOSIS — E1042 Type 1 diabetes mellitus with diabetic polyneuropathy: Secondary | ICD-10-CM | POA: Diagnosis not present

## 2017-04-01 DIAGNOSIS — M5441 Lumbago with sciatica, right side: Secondary | ICD-10-CM | POA: Diagnosis not present

## 2017-04-01 DIAGNOSIS — I110 Hypertensive heart disease with heart failure: Secondary | ICD-10-CM | POA: Diagnosis not present

## 2017-04-01 DIAGNOSIS — Z79891 Long term (current) use of opiate analgesic: Secondary | ICD-10-CM | POA: Diagnosis not present

## 2017-04-01 DIAGNOSIS — H35039 Hypertensive retinopathy, unspecified eye: Secondary | ICD-10-CM | POA: Diagnosis not present

## 2017-04-01 DIAGNOSIS — I5032 Chronic diastolic (congestive) heart failure: Secondary | ICD-10-CM | POA: Diagnosis not present

## 2017-04-03 DIAGNOSIS — H35039 Hypertensive retinopathy, unspecified eye: Secondary | ICD-10-CM | POA: Diagnosis not present

## 2017-04-03 DIAGNOSIS — M5441 Lumbago with sciatica, right side: Secondary | ICD-10-CM | POA: Diagnosis not present

## 2017-04-03 DIAGNOSIS — E1042 Type 1 diabetes mellitus with diabetic polyneuropathy: Secondary | ICD-10-CM | POA: Diagnosis not present

## 2017-04-03 DIAGNOSIS — I110 Hypertensive heart disease with heart failure: Secondary | ICD-10-CM | POA: Diagnosis not present

## 2017-04-03 DIAGNOSIS — I5032 Chronic diastolic (congestive) heart failure: Secondary | ICD-10-CM | POA: Diagnosis not present

## 2017-04-03 DIAGNOSIS — M5442 Lumbago with sciatica, left side: Secondary | ICD-10-CM | POA: Diagnosis not present

## 2017-04-03 DIAGNOSIS — Z79891 Long term (current) use of opiate analgesic: Secondary | ICD-10-CM | POA: Diagnosis not present

## 2017-04-03 DIAGNOSIS — Z7901 Long term (current) use of anticoagulants: Secondary | ICD-10-CM | POA: Diagnosis not present

## 2017-04-04 DIAGNOSIS — Z79891 Long term (current) use of opiate analgesic: Secondary | ICD-10-CM | POA: Diagnosis not present

## 2017-04-04 DIAGNOSIS — Z7901 Long term (current) use of anticoagulants: Secondary | ICD-10-CM | POA: Diagnosis not present

## 2017-04-04 DIAGNOSIS — M5441 Lumbago with sciatica, right side: Secondary | ICD-10-CM | POA: Diagnosis not present

## 2017-04-04 DIAGNOSIS — I5032 Chronic diastolic (congestive) heart failure: Secondary | ICD-10-CM | POA: Diagnosis not present

## 2017-04-04 DIAGNOSIS — I110 Hypertensive heart disease with heart failure: Secondary | ICD-10-CM | POA: Diagnosis not present

## 2017-04-04 DIAGNOSIS — H35039 Hypertensive retinopathy, unspecified eye: Secondary | ICD-10-CM | POA: Diagnosis not present

## 2017-04-04 DIAGNOSIS — E1042 Type 1 diabetes mellitus with diabetic polyneuropathy: Secondary | ICD-10-CM | POA: Diagnosis not present

## 2017-04-04 DIAGNOSIS — M5442 Lumbago with sciatica, left side: Secondary | ICD-10-CM | POA: Diagnosis not present

## 2017-04-05 DIAGNOSIS — I110 Hypertensive heart disease with heart failure: Secondary | ICD-10-CM | POA: Diagnosis not present

## 2017-04-05 DIAGNOSIS — E1042 Type 1 diabetes mellitus with diabetic polyneuropathy: Secondary | ICD-10-CM | POA: Diagnosis not present

## 2017-04-05 DIAGNOSIS — M5442 Lumbago with sciatica, left side: Secondary | ICD-10-CM | POA: Diagnosis not present

## 2017-04-05 DIAGNOSIS — H35039 Hypertensive retinopathy, unspecified eye: Secondary | ICD-10-CM | POA: Diagnosis not present

## 2017-04-05 DIAGNOSIS — Z7901 Long term (current) use of anticoagulants: Secondary | ICD-10-CM | POA: Diagnosis not present

## 2017-04-05 DIAGNOSIS — M5441 Lumbago with sciatica, right side: Secondary | ICD-10-CM | POA: Diagnosis not present

## 2017-04-05 DIAGNOSIS — Z79891 Long term (current) use of opiate analgesic: Secondary | ICD-10-CM | POA: Diagnosis not present

## 2017-04-05 DIAGNOSIS — I5032 Chronic diastolic (congestive) heart failure: Secondary | ICD-10-CM | POA: Diagnosis not present

## 2017-04-06 DIAGNOSIS — H35039 Hypertensive retinopathy, unspecified eye: Secondary | ICD-10-CM | POA: Diagnosis not present

## 2017-04-06 DIAGNOSIS — Z79891 Long term (current) use of opiate analgesic: Secondary | ICD-10-CM | POA: Diagnosis not present

## 2017-04-06 DIAGNOSIS — I110 Hypertensive heart disease with heart failure: Secondary | ICD-10-CM | POA: Diagnosis not present

## 2017-04-06 DIAGNOSIS — Z7901 Long term (current) use of anticoagulants: Secondary | ICD-10-CM | POA: Diagnosis not present

## 2017-04-06 DIAGNOSIS — E1042 Type 1 diabetes mellitus with diabetic polyneuropathy: Secondary | ICD-10-CM | POA: Diagnosis not present

## 2017-04-06 DIAGNOSIS — M5441 Lumbago with sciatica, right side: Secondary | ICD-10-CM | POA: Diagnosis not present

## 2017-04-06 DIAGNOSIS — I5032 Chronic diastolic (congestive) heart failure: Secondary | ICD-10-CM | POA: Diagnosis not present

## 2017-04-06 DIAGNOSIS — M5442 Lumbago with sciatica, left side: Secondary | ICD-10-CM | POA: Diagnosis not present

## 2017-04-08 DIAGNOSIS — I110 Hypertensive heart disease with heart failure: Secondary | ICD-10-CM | POA: Diagnosis not present

## 2017-04-08 DIAGNOSIS — E1042 Type 1 diabetes mellitus with diabetic polyneuropathy: Secondary | ICD-10-CM | POA: Diagnosis not present

## 2017-04-08 DIAGNOSIS — I5032 Chronic diastolic (congestive) heart failure: Secondary | ICD-10-CM | POA: Diagnosis not present

## 2017-04-08 DIAGNOSIS — Z7901 Long term (current) use of anticoagulants: Secondary | ICD-10-CM | POA: Diagnosis not present

## 2017-04-08 DIAGNOSIS — H35039 Hypertensive retinopathy, unspecified eye: Secondary | ICD-10-CM | POA: Diagnosis not present

## 2017-04-08 DIAGNOSIS — M5442 Lumbago with sciatica, left side: Secondary | ICD-10-CM | POA: Diagnosis not present

## 2017-04-08 DIAGNOSIS — M5441 Lumbago with sciatica, right side: Secondary | ICD-10-CM | POA: Diagnosis not present

## 2017-04-08 DIAGNOSIS — Z79891 Long term (current) use of opiate analgesic: Secondary | ICD-10-CM | POA: Diagnosis not present

## 2017-04-09 DIAGNOSIS — Z7901 Long term (current) use of anticoagulants: Secondary | ICD-10-CM | POA: Diagnosis not present

## 2017-04-09 DIAGNOSIS — E1042 Type 1 diabetes mellitus with diabetic polyneuropathy: Secondary | ICD-10-CM | POA: Diagnosis not present

## 2017-04-09 DIAGNOSIS — M5441 Lumbago with sciatica, right side: Secondary | ICD-10-CM | POA: Diagnosis not present

## 2017-04-09 DIAGNOSIS — Z79891 Long term (current) use of opiate analgesic: Secondary | ICD-10-CM | POA: Diagnosis not present

## 2017-04-09 DIAGNOSIS — M5442 Lumbago with sciatica, left side: Secondary | ICD-10-CM | POA: Diagnosis not present

## 2017-04-09 DIAGNOSIS — I110 Hypertensive heart disease with heart failure: Secondary | ICD-10-CM | POA: Diagnosis not present

## 2017-04-09 DIAGNOSIS — H35039 Hypertensive retinopathy, unspecified eye: Secondary | ICD-10-CM | POA: Diagnosis not present

## 2017-04-09 DIAGNOSIS — I5032 Chronic diastolic (congestive) heart failure: Secondary | ICD-10-CM | POA: Diagnosis not present

## 2017-04-10 DIAGNOSIS — I5032 Chronic diastolic (congestive) heart failure: Secondary | ICD-10-CM | POA: Diagnosis not present

## 2017-04-10 DIAGNOSIS — Z79891 Long term (current) use of opiate analgesic: Secondary | ICD-10-CM | POA: Diagnosis not present

## 2017-04-10 DIAGNOSIS — I110 Hypertensive heart disease with heart failure: Secondary | ICD-10-CM | POA: Diagnosis not present

## 2017-04-10 DIAGNOSIS — Z7901 Long term (current) use of anticoagulants: Secondary | ICD-10-CM | POA: Diagnosis not present

## 2017-04-10 DIAGNOSIS — E1042 Type 1 diabetes mellitus with diabetic polyneuropathy: Secondary | ICD-10-CM | POA: Diagnosis not present

## 2017-04-10 DIAGNOSIS — M5442 Lumbago with sciatica, left side: Secondary | ICD-10-CM | POA: Diagnosis not present

## 2017-04-10 DIAGNOSIS — H35039 Hypertensive retinopathy, unspecified eye: Secondary | ICD-10-CM | POA: Diagnosis not present

## 2017-04-10 DIAGNOSIS — M5441 Lumbago with sciatica, right side: Secondary | ICD-10-CM | POA: Diagnosis not present

## 2017-04-11 DIAGNOSIS — Z7901 Long term (current) use of anticoagulants: Secondary | ICD-10-CM | POA: Diagnosis not present

## 2017-04-11 DIAGNOSIS — Z79891 Long term (current) use of opiate analgesic: Secondary | ICD-10-CM | POA: Diagnosis not present

## 2017-04-11 DIAGNOSIS — H35039 Hypertensive retinopathy, unspecified eye: Secondary | ICD-10-CM | POA: Diagnosis not present

## 2017-04-11 DIAGNOSIS — I5032 Chronic diastolic (congestive) heart failure: Secondary | ICD-10-CM | POA: Diagnosis not present

## 2017-04-11 DIAGNOSIS — E1042 Type 1 diabetes mellitus with diabetic polyneuropathy: Secondary | ICD-10-CM | POA: Diagnosis not present

## 2017-04-11 DIAGNOSIS — M5441 Lumbago with sciatica, right side: Secondary | ICD-10-CM | POA: Diagnosis not present

## 2017-04-11 DIAGNOSIS — M5442 Lumbago with sciatica, left side: Secondary | ICD-10-CM | POA: Diagnosis not present

## 2017-04-11 DIAGNOSIS — I110 Hypertensive heart disease with heart failure: Secondary | ICD-10-CM | POA: Diagnosis not present

## 2017-04-14 DIAGNOSIS — Z7901 Long term (current) use of anticoagulants: Secondary | ICD-10-CM | POA: Diagnosis not present

## 2017-04-14 DIAGNOSIS — E1042 Type 1 diabetes mellitus with diabetic polyneuropathy: Secondary | ICD-10-CM | POA: Diagnosis not present

## 2017-04-14 DIAGNOSIS — Z79891 Long term (current) use of opiate analgesic: Secondary | ICD-10-CM | POA: Diagnosis not present

## 2017-04-14 DIAGNOSIS — I110 Hypertensive heart disease with heart failure: Secondary | ICD-10-CM | POA: Diagnosis not present

## 2017-04-14 DIAGNOSIS — I5032 Chronic diastolic (congestive) heart failure: Secondary | ICD-10-CM | POA: Diagnosis not present

## 2017-04-14 DIAGNOSIS — M5441 Lumbago with sciatica, right side: Secondary | ICD-10-CM | POA: Diagnosis not present

## 2017-04-14 DIAGNOSIS — H35039 Hypertensive retinopathy, unspecified eye: Secondary | ICD-10-CM | POA: Diagnosis not present

## 2017-04-14 DIAGNOSIS — M5442 Lumbago with sciatica, left side: Secondary | ICD-10-CM | POA: Diagnosis not present

## 2017-04-15 DIAGNOSIS — M5441 Lumbago with sciatica, right side: Secondary | ICD-10-CM | POA: Diagnosis not present

## 2017-04-15 DIAGNOSIS — E1042 Type 1 diabetes mellitus with diabetic polyneuropathy: Secondary | ICD-10-CM | POA: Diagnosis not present

## 2017-04-15 DIAGNOSIS — Z7901 Long term (current) use of anticoagulants: Secondary | ICD-10-CM | POA: Diagnosis not present

## 2017-04-15 DIAGNOSIS — H35039 Hypertensive retinopathy, unspecified eye: Secondary | ICD-10-CM | POA: Diagnosis not present

## 2017-04-15 DIAGNOSIS — M5442 Lumbago with sciatica, left side: Secondary | ICD-10-CM | POA: Diagnosis not present

## 2017-04-15 DIAGNOSIS — I5032 Chronic diastolic (congestive) heart failure: Secondary | ICD-10-CM | POA: Diagnosis not present

## 2017-04-15 DIAGNOSIS — Z79891 Long term (current) use of opiate analgesic: Secondary | ICD-10-CM | POA: Diagnosis not present

## 2017-04-15 DIAGNOSIS — I110 Hypertensive heart disease with heart failure: Secondary | ICD-10-CM | POA: Diagnosis not present

## 2017-04-18 DIAGNOSIS — E1042 Type 1 diabetes mellitus with diabetic polyneuropathy: Secondary | ICD-10-CM | POA: Diagnosis not present

## 2017-04-18 DIAGNOSIS — H35039 Hypertensive retinopathy, unspecified eye: Secondary | ICD-10-CM | POA: Diagnosis not present

## 2017-04-18 DIAGNOSIS — I110 Hypertensive heart disease with heart failure: Secondary | ICD-10-CM | POA: Diagnosis not present

## 2017-04-18 DIAGNOSIS — M5442 Lumbago with sciatica, left side: Secondary | ICD-10-CM | POA: Diagnosis not present

## 2017-04-18 DIAGNOSIS — M5441 Lumbago with sciatica, right side: Secondary | ICD-10-CM | POA: Diagnosis not present

## 2017-04-18 DIAGNOSIS — Z79891 Long term (current) use of opiate analgesic: Secondary | ICD-10-CM | POA: Diagnosis not present

## 2017-04-18 DIAGNOSIS — Z7901 Long term (current) use of anticoagulants: Secondary | ICD-10-CM | POA: Diagnosis not present

## 2017-04-18 DIAGNOSIS — I5032 Chronic diastolic (congestive) heart failure: Secondary | ICD-10-CM | POA: Diagnosis not present

## 2017-04-21 DIAGNOSIS — H35039 Hypertensive retinopathy, unspecified eye: Secondary | ICD-10-CM | POA: Diagnosis not present

## 2017-04-21 DIAGNOSIS — R0902 Hypoxemia: Secondary | ICD-10-CM | POA: Diagnosis not present

## 2017-04-21 DIAGNOSIS — I4891 Unspecified atrial fibrillation: Secondary | ICD-10-CM | POA: Diagnosis not present

## 2017-04-21 DIAGNOSIS — M5442 Lumbago with sciatica, left side: Secondary | ICD-10-CM | POA: Diagnosis not present

## 2017-04-21 DIAGNOSIS — G8929 Other chronic pain: Secondary | ICD-10-CM | POA: Diagnosis not present

## 2017-04-21 DIAGNOSIS — N19 Unspecified kidney failure: Secondary | ICD-10-CM | POA: Diagnosis not present

## 2017-04-21 DIAGNOSIS — J9621 Acute and chronic respiratory failure with hypoxia: Secondary | ICD-10-CM | POA: Diagnosis not present

## 2017-04-21 DIAGNOSIS — G4733 Obstructive sleep apnea (adult) (pediatric): Secondary | ICD-10-CM | POA: Diagnosis not present

## 2017-04-21 DIAGNOSIS — J069 Acute upper respiratory infection, unspecified: Secondary | ICD-10-CM | POA: Diagnosis not present

## 2017-04-21 DIAGNOSIS — K469 Unspecified abdominal hernia without obstruction or gangrene: Secondary | ICD-10-CM | POA: Diagnosis not present

## 2017-04-21 DIAGNOSIS — E08311 Diabetes mellitus due to underlying condition with unspecified diabetic retinopathy with macular edema: Secondary | ICD-10-CM | POA: Diagnosis not present

## 2017-04-21 DIAGNOSIS — R0602 Shortness of breath: Secondary | ICD-10-CM | POA: Diagnosis not present

## 2017-04-21 DIAGNOSIS — Z950 Presence of cardiac pacemaker: Secondary | ICD-10-CM | POA: Diagnosis not present

## 2017-04-21 DIAGNOSIS — Z79891 Long term (current) use of opiate analgesic: Secondary | ICD-10-CM | POA: Diagnosis not present

## 2017-04-21 DIAGNOSIS — R2681 Unsteadiness on feet: Secondary | ICD-10-CM | POA: Diagnosis not present

## 2017-04-21 DIAGNOSIS — E1042 Type 1 diabetes mellitus with diabetic polyneuropathy: Secondary | ICD-10-CM | POA: Diagnosis not present

## 2017-04-21 DIAGNOSIS — L0390 Cellulitis, unspecified: Secondary | ICD-10-CM | POA: Diagnosis not present

## 2017-04-21 DIAGNOSIS — M5441 Lumbago with sciatica, right side: Secondary | ICD-10-CM | POA: Diagnosis not present

## 2017-04-21 DIAGNOSIS — R0600 Dyspnea, unspecified: Secondary | ICD-10-CM | POA: Diagnosis not present

## 2017-04-21 DIAGNOSIS — R0689 Other abnormalities of breathing: Secondary | ICD-10-CM | POA: Diagnosis not present

## 2017-04-21 DIAGNOSIS — I481 Persistent atrial fibrillation: Secondary | ICD-10-CM | POA: Diagnosis not present

## 2017-04-21 DIAGNOSIS — N390 Urinary tract infection, site not specified: Secondary | ICD-10-CM | POA: Diagnosis not present

## 2017-04-21 DIAGNOSIS — Z7189 Other specified counseling: Secondary | ICD-10-CM | POA: Diagnosis not present

## 2017-04-21 DIAGNOSIS — I10 Essential (primary) hypertension: Secondary | ICD-10-CM | POA: Diagnosis not present

## 2017-04-21 DIAGNOSIS — E1065 Type 1 diabetes mellitus with hyperglycemia: Secondary | ICD-10-CM | POA: Diagnosis not present

## 2017-04-21 DIAGNOSIS — I5032 Chronic diastolic (congestive) heart failure: Secondary | ICD-10-CM | POA: Diagnosis not present

## 2017-04-21 DIAGNOSIS — R29898 Other symptoms and signs involving the musculoskeletal system: Secondary | ICD-10-CM | POA: Diagnosis not present

## 2017-04-21 DIAGNOSIS — Z7901 Long term (current) use of anticoagulants: Secondary | ICD-10-CM | POA: Diagnosis not present

## 2017-04-21 DIAGNOSIS — R001 Bradycardia, unspecified: Secondary | ICD-10-CM | POA: Diagnosis not present

## 2017-04-21 DIAGNOSIS — I5022 Chronic systolic (congestive) heart failure: Secondary | ICD-10-CM | POA: Diagnosis not present

## 2017-04-21 DIAGNOSIS — E785 Hyperlipidemia, unspecified: Secondary | ICD-10-CM | POA: Diagnosis not present

## 2017-04-21 DIAGNOSIS — R7989 Other specified abnormal findings of blood chemistry: Secondary | ICD-10-CM | POA: Diagnosis not present

## 2017-04-21 DIAGNOSIS — I110 Hypertensive heart disease with heart failure: Secondary | ICD-10-CM | POA: Diagnosis not present

## 2017-04-21 DIAGNOSIS — R05 Cough: Secondary | ICD-10-CM | POA: Diagnosis not present

## 2017-04-21 DIAGNOSIS — I5043 Acute on chronic combined systolic (congestive) and diastolic (congestive) heart failure: Secondary | ICD-10-CM | POA: Diagnosis not present

## 2017-04-23 DIAGNOSIS — Z950 Presence of cardiac pacemaker: Secondary | ICD-10-CM | POA: Diagnosis not present

## 2017-04-23 DIAGNOSIS — E1042 Type 1 diabetes mellitus with diabetic polyneuropathy: Secondary | ICD-10-CM | POA: Diagnosis not present

## 2017-04-23 DIAGNOSIS — I5022 Chronic systolic (congestive) heart failure: Secondary | ICD-10-CM | POA: Diagnosis not present

## 2017-04-23 DIAGNOSIS — G8929 Other chronic pain: Secondary | ICD-10-CM | POA: Diagnosis not present

## 2017-04-25 DIAGNOSIS — G8929 Other chronic pain: Secondary | ICD-10-CM | POA: Diagnosis not present

## 2017-04-25 DIAGNOSIS — E1042 Type 1 diabetes mellitus with diabetic polyneuropathy: Secondary | ICD-10-CM | POA: Diagnosis not present

## 2017-04-25 DIAGNOSIS — I5022 Chronic systolic (congestive) heart failure: Secondary | ICD-10-CM | POA: Diagnosis not present

## 2017-04-30 DIAGNOSIS — E1042 Type 1 diabetes mellitus with diabetic polyneuropathy: Secondary | ICD-10-CM | POA: Diagnosis not present

## 2017-04-30 DIAGNOSIS — G8929 Other chronic pain: Secondary | ICD-10-CM | POA: Diagnosis not present

## 2017-04-30 DIAGNOSIS — I5022 Chronic systolic (congestive) heart failure: Secondary | ICD-10-CM | POA: Diagnosis not present

## 2017-05-02 DIAGNOSIS — R29898 Other symptoms and signs involving the musculoskeletal system: Secondary | ICD-10-CM | POA: Diagnosis not present

## 2017-05-02 DIAGNOSIS — R0689 Other abnormalities of breathing: Secondary | ICD-10-CM | POA: Diagnosis not present

## 2017-05-05 DIAGNOSIS — I5022 Chronic systolic (congestive) heart failure: Secondary | ICD-10-CM | POA: Diagnosis not present

## 2017-05-05 DIAGNOSIS — R0689 Other abnormalities of breathing: Secondary | ICD-10-CM | POA: Diagnosis not present

## 2017-05-05 DIAGNOSIS — R29898 Other symptoms and signs involving the musculoskeletal system: Secondary | ICD-10-CM | POA: Diagnosis not present

## 2017-05-07 DIAGNOSIS — E1042 Type 1 diabetes mellitus with diabetic polyneuropathy: Secondary | ICD-10-CM | POA: Diagnosis not present

## 2017-05-07 DIAGNOSIS — I5022 Chronic systolic (congestive) heart failure: Secondary | ICD-10-CM | POA: Diagnosis not present

## 2017-05-07 DIAGNOSIS — G8929 Other chronic pain: Secondary | ICD-10-CM | POA: Diagnosis not present

## 2017-05-16 DIAGNOSIS — E1042 Type 1 diabetes mellitus with diabetic polyneuropathy: Secondary | ICD-10-CM | POA: Diagnosis not present

## 2017-05-16 DIAGNOSIS — R29898 Other symptoms and signs involving the musculoskeletal system: Secondary | ICD-10-CM | POA: Diagnosis not present

## 2017-05-16 DIAGNOSIS — I5022 Chronic systolic (congestive) heart failure: Secondary | ICD-10-CM | POA: Diagnosis not present

## 2017-05-19 DIAGNOSIS — R29898 Other symptoms and signs involving the musculoskeletal system: Secondary | ICD-10-CM | POA: Diagnosis not present

## 2017-05-19 DIAGNOSIS — G8929 Other chronic pain: Secondary | ICD-10-CM | POA: Diagnosis not present

## 2017-05-21 DIAGNOSIS — G8929 Other chronic pain: Secondary | ICD-10-CM | POA: Diagnosis not present

## 2017-05-21 DIAGNOSIS — I5022 Chronic systolic (congestive) heart failure: Secondary | ICD-10-CM | POA: Diagnosis not present

## 2017-06-04 DIAGNOSIS — G8929 Other chronic pain: Secondary | ICD-10-CM | POA: Diagnosis not present

## 2017-06-11 DIAGNOSIS — G8929 Other chronic pain: Secondary | ICD-10-CM | POA: Diagnosis not present

## 2017-06-18 DIAGNOSIS — G8929 Other chronic pain: Secondary | ICD-10-CM | POA: Diagnosis not present

## 2017-06-18 DIAGNOSIS — E1042 Type 1 diabetes mellitus with diabetic polyneuropathy: Secondary | ICD-10-CM | POA: Diagnosis not present

## 2017-06-18 DIAGNOSIS — I5022 Chronic systolic (congestive) heart failure: Secondary | ICD-10-CM | POA: Diagnosis not present

## 2017-06-23 DIAGNOSIS — E0801 Diabetes mellitus due to underlying condition with hyperosmolarity with coma: Secondary | ICD-10-CM | POA: Diagnosis not present

## 2017-06-23 DIAGNOSIS — E559 Vitamin D deficiency, unspecified: Secondary | ICD-10-CM | POA: Diagnosis not present

## 2017-06-25 DIAGNOSIS — E1042 Type 1 diabetes mellitus with diabetic polyneuropathy: Secondary | ICD-10-CM | POA: Diagnosis not present

## 2017-06-25 DIAGNOSIS — G8929 Other chronic pain: Secondary | ICD-10-CM | POA: Diagnosis not present

## 2017-06-27 DIAGNOSIS — E0801 Diabetes mellitus due to underlying condition with hyperosmolarity with coma: Secondary | ICD-10-CM | POA: Diagnosis not present

## 2017-07-02 ENCOUNTER — Ambulatory Visit: Payer: Medicare Other | Admitting: Diagnostic Neuroimaging

## 2017-07-02 DIAGNOSIS — E1051 Type 1 diabetes mellitus with diabetic peripheral angiopathy without gangrene: Secondary | ICD-10-CM | POA: Diagnosis not present

## 2017-07-02 DIAGNOSIS — G8929 Other chronic pain: Secondary | ICD-10-CM | POA: Diagnosis not present

## 2017-07-02 DIAGNOSIS — E1042 Type 1 diabetes mellitus with diabetic polyneuropathy: Secondary | ICD-10-CM | POA: Diagnosis not present

## 2017-07-02 DIAGNOSIS — L84 Corns and callosities: Secondary | ICD-10-CM | POA: Diagnosis not present

## 2017-07-03 ENCOUNTER — Encounter: Payer: Self-pay | Admitting: Diagnostic Neuroimaging

## 2017-07-03 DIAGNOSIS — L89310 Pressure ulcer of right buttock, unstageable: Secondary | ICD-10-CM | POA: Diagnosis not present

## 2017-07-03 DIAGNOSIS — L89150 Pressure ulcer of sacral region, unstageable: Secondary | ICD-10-CM | POA: Diagnosis not present

## 2017-07-04 DIAGNOSIS — G8929 Other chronic pain: Secondary | ICD-10-CM | POA: Diagnosis not present

## 2017-07-04 DIAGNOSIS — R293 Abnormal posture: Secondary | ICD-10-CM | POA: Diagnosis not present

## 2017-07-04 DIAGNOSIS — M5441 Lumbago with sciatica, right side: Secondary | ICD-10-CM | POA: Diagnosis not present

## 2017-07-07 DIAGNOSIS — M5441 Lumbago with sciatica, right side: Secondary | ICD-10-CM | POA: Diagnosis not present

## 2017-07-07 DIAGNOSIS — G8929 Other chronic pain: Secondary | ICD-10-CM | POA: Diagnosis not present

## 2017-07-07 DIAGNOSIS — R293 Abnormal posture: Secondary | ICD-10-CM | POA: Diagnosis not present

## 2017-07-08 DIAGNOSIS — R293 Abnormal posture: Secondary | ICD-10-CM | POA: Diagnosis not present

## 2017-07-08 DIAGNOSIS — M5441 Lumbago with sciatica, right side: Secondary | ICD-10-CM | POA: Diagnosis not present

## 2017-07-08 DIAGNOSIS — G8929 Other chronic pain: Secondary | ICD-10-CM | POA: Diagnosis not present

## 2017-07-09 DIAGNOSIS — I5022 Chronic systolic (congestive) heart failure: Secondary | ICD-10-CM | POA: Diagnosis not present

## 2017-07-09 DIAGNOSIS — E1042 Type 1 diabetes mellitus with diabetic polyneuropathy: Secondary | ICD-10-CM | POA: Diagnosis not present

## 2017-07-09 DIAGNOSIS — G8929 Other chronic pain: Secondary | ICD-10-CM | POA: Diagnosis not present

## 2017-07-10 DIAGNOSIS — M5441 Lumbago with sciatica, right side: Secondary | ICD-10-CM | POA: Diagnosis not present

## 2017-07-10 DIAGNOSIS — R293 Abnormal posture: Secondary | ICD-10-CM | POA: Diagnosis not present

## 2017-07-10 DIAGNOSIS — G8929 Other chronic pain: Secondary | ICD-10-CM | POA: Diagnosis not present

## 2017-07-11 DIAGNOSIS — E08311 Diabetes mellitus due to underlying condition with unspecified diabetic retinopathy with macular edema: Secondary | ICD-10-CM | POA: Diagnosis not present

## 2017-07-11 DIAGNOSIS — E0801 Diabetes mellitus due to underlying condition with hyperosmolarity with coma: Secondary | ICD-10-CM | POA: Diagnosis not present

## 2017-07-12 DIAGNOSIS — M5441 Lumbago with sciatica, right side: Secondary | ICD-10-CM | POA: Diagnosis not present

## 2017-07-12 DIAGNOSIS — R293 Abnormal posture: Secondary | ICD-10-CM | POA: Diagnosis not present

## 2017-07-12 DIAGNOSIS — G8929 Other chronic pain: Secondary | ICD-10-CM | POA: Diagnosis not present

## 2017-07-14 DIAGNOSIS — G8929 Other chronic pain: Secondary | ICD-10-CM | POA: Diagnosis not present

## 2017-07-14 DIAGNOSIS — R293 Abnormal posture: Secondary | ICD-10-CM | POA: Diagnosis not present

## 2017-07-14 DIAGNOSIS — M5441 Lumbago with sciatica, right side: Secondary | ICD-10-CM | POA: Diagnosis not present

## 2017-07-15 DIAGNOSIS — R293 Abnormal posture: Secondary | ICD-10-CM | POA: Diagnosis not present

## 2017-07-15 DIAGNOSIS — M5441 Lumbago with sciatica, right side: Secondary | ICD-10-CM | POA: Diagnosis not present

## 2017-07-15 DIAGNOSIS — G8929 Other chronic pain: Secondary | ICD-10-CM | POA: Diagnosis not present

## 2017-07-16 DIAGNOSIS — M5441 Lumbago with sciatica, right side: Secondary | ICD-10-CM | POA: Diagnosis not present

## 2017-07-16 DIAGNOSIS — R293 Abnormal posture: Secondary | ICD-10-CM | POA: Diagnosis not present

## 2017-07-16 DIAGNOSIS — G8929 Other chronic pain: Secondary | ICD-10-CM | POA: Diagnosis not present

## 2017-07-17 DIAGNOSIS — L89150 Pressure ulcer of sacral region, unstageable: Secondary | ICD-10-CM | POA: Diagnosis not present

## 2017-07-17 DIAGNOSIS — L89310 Pressure ulcer of right buttock, unstageable: Secondary | ICD-10-CM | POA: Diagnosis not present

## 2017-07-17 DIAGNOSIS — R293 Abnormal posture: Secondary | ICD-10-CM | POA: Diagnosis not present

## 2017-07-17 DIAGNOSIS — M5441 Lumbago with sciatica, right side: Secondary | ICD-10-CM | POA: Diagnosis not present

## 2017-07-17 DIAGNOSIS — G8929 Other chronic pain: Secondary | ICD-10-CM | POA: Diagnosis not present

## 2017-07-18 DIAGNOSIS — K5909 Other constipation: Secondary | ICD-10-CM | POA: Diagnosis not present

## 2017-07-18 DIAGNOSIS — Z950 Presence of cardiac pacemaker: Secondary | ICD-10-CM | POA: Diagnosis not present

## 2017-07-18 DIAGNOSIS — I5022 Chronic systolic (congestive) heart failure: Secondary | ICD-10-CM | POA: Diagnosis not present

## 2017-07-18 DIAGNOSIS — E1042 Type 1 diabetes mellitus with diabetic polyneuropathy: Secondary | ICD-10-CM | POA: Diagnosis not present

## 2017-07-19 DIAGNOSIS — M5441 Lumbago with sciatica, right side: Secondary | ICD-10-CM | POA: Diagnosis not present

## 2017-07-19 DIAGNOSIS — G8929 Other chronic pain: Secondary | ICD-10-CM | POA: Diagnosis not present

## 2017-07-19 DIAGNOSIS — R293 Abnormal posture: Secondary | ICD-10-CM | POA: Diagnosis not present

## 2017-07-21 DIAGNOSIS — G8929 Other chronic pain: Secondary | ICD-10-CM | POA: Diagnosis not present

## 2017-07-21 DIAGNOSIS — R293 Abnormal posture: Secondary | ICD-10-CM | POA: Diagnosis not present

## 2017-07-21 DIAGNOSIS — M5441 Lumbago with sciatica, right side: Secondary | ICD-10-CM | POA: Diagnosis not present

## 2017-07-22 DIAGNOSIS — M5441 Lumbago with sciatica, right side: Secondary | ICD-10-CM | POA: Diagnosis not present

## 2017-07-22 DIAGNOSIS — G8929 Other chronic pain: Secondary | ICD-10-CM | POA: Diagnosis not present

## 2017-07-22 DIAGNOSIS — R293 Abnormal posture: Secondary | ICD-10-CM | POA: Diagnosis not present

## 2017-07-23 DIAGNOSIS — M5441 Lumbago with sciatica, right side: Secondary | ICD-10-CM | POA: Diagnosis not present

## 2017-07-23 DIAGNOSIS — I5022 Chronic systolic (congestive) heart failure: Secondary | ICD-10-CM | POA: Diagnosis not present

## 2017-07-23 DIAGNOSIS — R293 Abnormal posture: Secondary | ICD-10-CM | POA: Diagnosis not present

## 2017-07-23 DIAGNOSIS — E1042 Type 1 diabetes mellitus with diabetic polyneuropathy: Secondary | ICD-10-CM | POA: Diagnosis not present

## 2017-07-23 DIAGNOSIS — G8929 Other chronic pain: Secondary | ICD-10-CM | POA: Diagnosis not present

## 2017-07-23 DIAGNOSIS — N390 Urinary tract infection, site not specified: Secondary | ICD-10-CM | POA: Diagnosis not present

## 2017-07-24 DIAGNOSIS — L89150 Pressure ulcer of sacral region, unstageable: Secondary | ICD-10-CM | POA: Diagnosis not present

## 2017-07-24 DIAGNOSIS — R293 Abnormal posture: Secondary | ICD-10-CM | POA: Diagnosis not present

## 2017-07-24 DIAGNOSIS — G8929 Other chronic pain: Secondary | ICD-10-CM | POA: Diagnosis not present

## 2017-07-24 DIAGNOSIS — L89310 Pressure ulcer of right buttock, unstageable: Secondary | ICD-10-CM | POA: Diagnosis not present

## 2017-07-24 DIAGNOSIS — M5441 Lumbago with sciatica, right side: Secondary | ICD-10-CM | POA: Diagnosis not present

## 2017-07-25 DIAGNOSIS — E559 Vitamin D deficiency, unspecified: Secondary | ICD-10-CM | POA: Diagnosis not present

## 2017-07-25 DIAGNOSIS — G8929 Other chronic pain: Secondary | ICD-10-CM | POA: Diagnosis not present

## 2017-07-25 DIAGNOSIS — E1042 Type 1 diabetes mellitus with diabetic polyneuropathy: Secondary | ICD-10-CM | POA: Diagnosis not present

## 2017-07-25 DIAGNOSIS — I5022 Chronic systolic (congestive) heart failure: Secondary | ICD-10-CM | POA: Diagnosis not present

## 2017-07-27 DIAGNOSIS — G8929 Other chronic pain: Secondary | ICD-10-CM | POA: Diagnosis not present

## 2017-07-27 DIAGNOSIS — R293 Abnormal posture: Secondary | ICD-10-CM | POA: Diagnosis not present

## 2017-07-27 DIAGNOSIS — M5441 Lumbago with sciatica, right side: Secondary | ICD-10-CM | POA: Diagnosis not present

## 2017-07-29 DIAGNOSIS — R293 Abnormal posture: Secondary | ICD-10-CM | POA: Diagnosis not present

## 2017-07-29 DIAGNOSIS — M5441 Lumbago with sciatica, right side: Secondary | ICD-10-CM | POA: Diagnosis not present

## 2017-07-29 DIAGNOSIS — G8929 Other chronic pain: Secondary | ICD-10-CM | POA: Diagnosis not present

## 2017-07-30 DIAGNOSIS — I5022 Chronic systolic (congestive) heart failure: Secondary | ICD-10-CM | POA: Diagnosis not present

## 2017-07-30 DIAGNOSIS — M5441 Lumbago with sciatica, right side: Secondary | ICD-10-CM | POA: Diagnosis not present

## 2017-07-30 DIAGNOSIS — R293 Abnormal posture: Secondary | ICD-10-CM | POA: Diagnosis not present

## 2017-07-30 DIAGNOSIS — G8929 Other chronic pain: Secondary | ICD-10-CM | POA: Diagnosis not present

## 2017-07-30 DIAGNOSIS — E1042 Type 1 diabetes mellitus with diabetic polyneuropathy: Secondary | ICD-10-CM | POA: Diagnosis not present

## 2017-07-31 DIAGNOSIS — M5441 Lumbago with sciatica, right side: Secondary | ICD-10-CM | POA: Diagnosis not present

## 2017-07-31 DIAGNOSIS — L89153 Pressure ulcer of sacral region, stage 3: Secondary | ICD-10-CM | POA: Diagnosis not present

## 2017-07-31 DIAGNOSIS — G8929 Other chronic pain: Secondary | ICD-10-CM | POA: Diagnosis not present

## 2017-07-31 DIAGNOSIS — L89310 Pressure ulcer of right buttock, unstageable: Secondary | ICD-10-CM | POA: Diagnosis not present

## 2017-07-31 DIAGNOSIS — R293 Abnormal posture: Secondary | ICD-10-CM | POA: Diagnosis not present

## 2017-08-01 DIAGNOSIS — R3989 Other symptoms and signs involving the genitourinary system: Secondary | ICD-10-CM | POA: Diagnosis not present

## 2017-08-01 DIAGNOSIS — G8929 Other chronic pain: Secondary | ICD-10-CM | POA: Diagnosis not present

## 2017-08-01 DIAGNOSIS — E1042 Type 1 diabetes mellitus with diabetic polyneuropathy: Secondary | ICD-10-CM | POA: Diagnosis not present

## 2017-08-04 DIAGNOSIS — N390 Urinary tract infection, site not specified: Secondary | ICD-10-CM | POA: Diagnosis not present

## 2017-08-06 DIAGNOSIS — E1042 Type 1 diabetes mellitus with diabetic polyneuropathy: Secondary | ICD-10-CM | POA: Diagnosis not present

## 2017-08-06 DIAGNOSIS — I5022 Chronic systolic (congestive) heart failure: Secondary | ICD-10-CM | POA: Diagnosis not present

## 2017-08-06 DIAGNOSIS — G8929 Other chronic pain: Secondary | ICD-10-CM | POA: Diagnosis not present

## 2017-08-07 DIAGNOSIS — L89313 Pressure ulcer of right buttock, stage 3: Secondary | ICD-10-CM | POA: Diagnosis not present

## 2017-08-12 DIAGNOSIS — E1042 Type 1 diabetes mellitus with diabetic polyneuropathy: Secondary | ICD-10-CM | POA: Diagnosis not present

## 2017-08-12 DIAGNOSIS — G8929 Other chronic pain: Secondary | ICD-10-CM | POA: Diagnosis not present

## 2017-08-21 DIAGNOSIS — L89313 Pressure ulcer of right buttock, stage 3: Secondary | ICD-10-CM | POA: Diagnosis not present

## 2017-08-22 DIAGNOSIS — E1042 Type 1 diabetes mellitus with diabetic polyneuropathy: Secondary | ICD-10-CM | POA: Diagnosis not present

## 2017-08-22 DIAGNOSIS — G8929 Other chronic pain: Secondary | ICD-10-CM | POA: Diagnosis not present

## 2017-08-22 DIAGNOSIS — R300 Dysuria: Secondary | ICD-10-CM | POA: Diagnosis not present

## 2017-08-29 DIAGNOSIS — R0981 Nasal congestion: Secondary | ICD-10-CM | POA: Diagnosis not present

## 2017-08-29 DIAGNOSIS — G8929 Other chronic pain: Secondary | ICD-10-CM | POA: Diagnosis not present

## 2017-08-29 DIAGNOSIS — E1042 Type 1 diabetes mellitus with diabetic polyneuropathy: Secondary | ICD-10-CM | POA: Diagnosis not present

## 2017-09-02 DIAGNOSIS — R350 Frequency of micturition: Secondary | ICD-10-CM | POA: Diagnosis not present

## 2017-09-02 DIAGNOSIS — R3121 Asymptomatic microscopic hematuria: Secondary | ICD-10-CM | POA: Diagnosis not present

## 2017-09-02 DIAGNOSIS — N390 Urinary tract infection, site not specified: Secondary | ICD-10-CM | POA: Diagnosis not present

## 2017-09-03 DIAGNOSIS — G8929 Other chronic pain: Secondary | ICD-10-CM | POA: Diagnosis not present

## 2017-09-03 DIAGNOSIS — I5022 Chronic systolic (congestive) heart failure: Secondary | ICD-10-CM | POA: Diagnosis not present

## 2017-09-03 DIAGNOSIS — E1042 Type 1 diabetes mellitus with diabetic polyneuropathy: Secondary | ICD-10-CM | POA: Diagnosis not present

## 2017-09-03 DIAGNOSIS — G63 Polyneuropathy in diseases classified elsewhere: Secondary | ICD-10-CM | POA: Diagnosis not present

## 2017-09-03 DIAGNOSIS — I5043 Acute on chronic combined systolic (congestive) and diastolic (congestive) heart failure: Secondary | ICD-10-CM | POA: Diagnosis not present

## 2017-09-04 DIAGNOSIS — G8929 Other chronic pain: Secondary | ICD-10-CM | POA: Diagnosis not present

## 2017-09-04 DIAGNOSIS — I5043 Acute on chronic combined systolic (congestive) and diastolic (congestive) heart failure: Secondary | ICD-10-CM | POA: Diagnosis not present

## 2017-09-04 DIAGNOSIS — Z872 Personal history of diseases of the skin and subcutaneous tissue: Secondary | ICD-10-CM | POA: Diagnosis not present

## 2017-09-05 DIAGNOSIS — G8929 Other chronic pain: Secondary | ICD-10-CM | POA: Diagnosis not present

## 2017-09-05 DIAGNOSIS — I5043 Acute on chronic combined systolic (congestive) and diastolic (congestive) heart failure: Secondary | ICD-10-CM | POA: Diagnosis not present

## 2017-09-08 DIAGNOSIS — E08311 Diabetes mellitus due to underlying condition with unspecified diabetic retinopathy with macular edema: Secondary | ICD-10-CM | POA: Diagnosis not present

## 2017-09-08 DIAGNOSIS — I5043 Acute on chronic combined systolic (congestive) and diastolic (congestive) heart failure: Secondary | ICD-10-CM | POA: Diagnosis not present

## 2017-09-08 DIAGNOSIS — E0801 Diabetes mellitus due to underlying condition with hyperosmolarity with coma: Secondary | ICD-10-CM | POA: Diagnosis not present

## 2017-09-08 DIAGNOSIS — G8929 Other chronic pain: Secondary | ICD-10-CM | POA: Diagnosis not present

## 2017-09-09 DIAGNOSIS — G8929 Other chronic pain: Secondary | ICD-10-CM | POA: Diagnosis not present

## 2017-09-09 DIAGNOSIS — I5043 Acute on chronic combined systolic (congestive) and diastolic (congestive) heart failure: Secondary | ICD-10-CM | POA: Diagnosis not present

## 2017-09-10 DIAGNOSIS — I5043 Acute on chronic combined systolic (congestive) and diastolic (congestive) heart failure: Secondary | ICD-10-CM | POA: Diagnosis not present

## 2017-09-10 DIAGNOSIS — G8929 Other chronic pain: Secondary | ICD-10-CM | POA: Diagnosis not present

## 2017-09-16 DIAGNOSIS — I5043 Acute on chronic combined systolic (congestive) and diastolic (congestive) heart failure: Secondary | ICD-10-CM | POA: Diagnosis not present

## 2017-09-16 DIAGNOSIS — R300 Dysuria: Secondary | ICD-10-CM | POA: Diagnosis not present

## 2017-09-16 DIAGNOSIS — R350 Frequency of micturition: Secondary | ICD-10-CM | POA: Diagnosis not present

## 2017-09-16 DIAGNOSIS — R3129 Other microscopic hematuria: Secondary | ICD-10-CM | POA: Diagnosis not present

## 2017-09-16 DIAGNOSIS — R3121 Asymptomatic microscopic hematuria: Secondary | ICD-10-CM | POA: Diagnosis not present

## 2017-09-16 DIAGNOSIS — N390 Urinary tract infection, site not specified: Secondary | ICD-10-CM | POA: Diagnosis not present

## 2017-09-16 DIAGNOSIS — G8929 Other chronic pain: Secondary | ICD-10-CM | POA: Diagnosis not present

## 2017-09-16 DIAGNOSIS — K8020 Calculus of gallbladder without cholecystitis without obstruction: Secondary | ICD-10-CM | POA: Diagnosis not present

## 2017-09-17 DIAGNOSIS — I484 Atypical atrial flutter: Secondary | ICD-10-CM | POA: Diagnosis not present

## 2017-09-17 DIAGNOSIS — I5022 Chronic systolic (congestive) heart failure: Secondary | ICD-10-CM | POA: Diagnosis not present

## 2017-09-17 DIAGNOSIS — G8929 Other chronic pain: Secondary | ICD-10-CM | POA: Diagnosis not present

## 2017-09-17 DIAGNOSIS — E1051 Type 1 diabetes mellitus with diabetic peripheral angiopathy without gangrene: Secondary | ICD-10-CM | POA: Diagnosis not present

## 2017-09-17 DIAGNOSIS — E1042 Type 1 diabetes mellitus with diabetic polyneuropathy: Secondary | ICD-10-CM | POA: Diagnosis not present

## 2017-09-17 DIAGNOSIS — I5043 Acute on chronic combined systolic (congestive) and diastolic (congestive) heart failure: Secondary | ICD-10-CM | POA: Diagnosis not present

## 2017-09-17 DIAGNOSIS — L84 Corns and callosities: Secondary | ICD-10-CM | POA: Diagnosis not present

## 2017-09-18 DIAGNOSIS — I5043 Acute on chronic combined systolic (congestive) and diastolic (congestive) heart failure: Secondary | ICD-10-CM | POA: Diagnosis not present

## 2017-09-18 DIAGNOSIS — G8929 Other chronic pain: Secondary | ICD-10-CM | POA: Diagnosis not present

## 2017-09-19 DIAGNOSIS — I5043 Acute on chronic combined systolic (congestive) and diastolic (congestive) heart failure: Secondary | ICD-10-CM | POA: Diagnosis not present

## 2017-09-19 DIAGNOSIS — G8929 Other chronic pain: Secondary | ICD-10-CM | POA: Diagnosis not present

## 2017-09-20 DIAGNOSIS — I5043 Acute on chronic combined systolic (congestive) and diastolic (congestive) heart failure: Secondary | ICD-10-CM | POA: Diagnosis not present

## 2017-09-20 DIAGNOSIS — G8929 Other chronic pain: Secondary | ICD-10-CM | POA: Diagnosis not present

## 2017-09-22 DIAGNOSIS — I5043 Acute on chronic combined systolic (congestive) and diastolic (congestive) heart failure: Secondary | ICD-10-CM | POA: Diagnosis not present

## 2017-09-22 DIAGNOSIS — G8929 Other chronic pain: Secondary | ICD-10-CM | POA: Diagnosis not present

## 2017-09-23 DIAGNOSIS — I5043 Acute on chronic combined systolic (congestive) and diastolic (congestive) heart failure: Secondary | ICD-10-CM | POA: Diagnosis not present

## 2017-09-23 DIAGNOSIS — G8929 Other chronic pain: Secondary | ICD-10-CM | POA: Diagnosis not present

## 2017-09-24 DIAGNOSIS — G8929 Other chronic pain: Secondary | ICD-10-CM | POA: Diagnosis not present

## 2017-09-24 DIAGNOSIS — E1042 Type 1 diabetes mellitus with diabetic polyneuropathy: Secondary | ICD-10-CM | POA: Diagnosis not present

## 2017-09-24 DIAGNOSIS — I484 Atypical atrial flutter: Secondary | ICD-10-CM | POA: Diagnosis not present

## 2017-09-24 DIAGNOSIS — I5043 Acute on chronic combined systolic (congestive) and diastolic (congestive) heart failure: Secondary | ICD-10-CM | POA: Diagnosis not present

## 2017-09-25 DIAGNOSIS — I5043 Acute on chronic combined systolic (congestive) and diastolic (congestive) heart failure: Secondary | ICD-10-CM | POA: Diagnosis not present

## 2017-09-25 DIAGNOSIS — G8929 Other chronic pain: Secondary | ICD-10-CM | POA: Diagnosis not present

## 2017-09-26 DIAGNOSIS — I5043 Acute on chronic combined systolic (congestive) and diastolic (congestive) heart failure: Secondary | ICD-10-CM | POA: Diagnosis not present

## 2017-09-26 DIAGNOSIS — G8929 Other chronic pain: Secondary | ICD-10-CM | POA: Diagnosis not present

## 2017-09-28 DIAGNOSIS — I5043 Acute on chronic combined systolic (congestive) and diastolic (congestive) heart failure: Secondary | ICD-10-CM | POA: Diagnosis not present

## 2017-09-28 DIAGNOSIS — G8929 Other chronic pain: Secondary | ICD-10-CM | POA: Diagnosis not present

## 2017-10-01 DIAGNOSIS — I5043 Acute on chronic combined systolic (congestive) and diastolic (congestive) heart failure: Secondary | ICD-10-CM | POA: Diagnosis not present

## 2017-10-01 DIAGNOSIS — I10 Essential (primary) hypertension: Secondary | ICD-10-CM | POA: Diagnosis not present

## 2017-10-01 DIAGNOSIS — E1042 Type 1 diabetes mellitus with diabetic polyneuropathy: Secondary | ICD-10-CM | POA: Diagnosis not present

## 2017-10-01 DIAGNOSIS — G8929 Other chronic pain: Secondary | ICD-10-CM | POA: Diagnosis not present

## 2017-10-01 DIAGNOSIS — I5022 Chronic systolic (congestive) heart failure: Secondary | ICD-10-CM | POA: Diagnosis not present

## 2017-10-02 DIAGNOSIS — I5043 Acute on chronic combined systolic (congestive) and diastolic (congestive) heart failure: Secondary | ICD-10-CM | POA: Diagnosis not present

## 2017-10-02 DIAGNOSIS — G8929 Other chronic pain: Secondary | ICD-10-CM | POA: Diagnosis not present

## 2017-10-03 DIAGNOSIS — G8929 Other chronic pain: Secondary | ICD-10-CM | POA: Diagnosis not present

## 2017-10-03 DIAGNOSIS — I5043 Acute on chronic combined systolic (congestive) and diastolic (congestive) heart failure: Secondary | ICD-10-CM | POA: Diagnosis not present

## 2017-10-06 DIAGNOSIS — G8929 Other chronic pain: Secondary | ICD-10-CM | POA: Diagnosis not present

## 2017-10-06 DIAGNOSIS — D649 Anemia, unspecified: Secondary | ICD-10-CM | POA: Diagnosis not present

## 2017-10-06 DIAGNOSIS — I10 Essential (primary) hypertension: Secondary | ICD-10-CM | POA: Diagnosis not present

## 2017-10-06 DIAGNOSIS — I5043 Acute on chronic combined systolic (congestive) and diastolic (congestive) heart failure: Secondary | ICD-10-CM | POA: Diagnosis not present

## 2017-10-06 DIAGNOSIS — I484 Atypical atrial flutter: Secondary | ICD-10-CM | POA: Diagnosis not present

## 2017-10-08 DIAGNOSIS — E1042 Type 1 diabetes mellitus with diabetic polyneuropathy: Secondary | ICD-10-CM | POA: Diagnosis not present

## 2017-10-08 DIAGNOSIS — I484 Atypical atrial flutter: Secondary | ICD-10-CM | POA: Diagnosis not present

## 2017-10-08 DIAGNOSIS — I5043 Acute on chronic combined systolic (congestive) and diastolic (congestive) heart failure: Secondary | ICD-10-CM | POA: Diagnosis not present

## 2017-10-08 DIAGNOSIS — I5022 Chronic systolic (congestive) heart failure: Secondary | ICD-10-CM | POA: Diagnosis not present

## 2017-10-08 DIAGNOSIS — I10 Essential (primary) hypertension: Secondary | ICD-10-CM | POA: Diagnosis not present

## 2017-10-08 DIAGNOSIS — M25532 Pain in left wrist: Secondary | ICD-10-CM | POA: Diagnosis not present

## 2017-10-08 DIAGNOSIS — N2889 Other specified disorders of kidney and ureter: Secondary | ICD-10-CM | POA: Diagnosis not present

## 2017-10-08 DIAGNOSIS — G8929 Other chronic pain: Secondary | ICD-10-CM | POA: Diagnosis not present

## 2017-10-10 DIAGNOSIS — M25532 Pain in left wrist: Secondary | ICD-10-CM | POA: Diagnosis not present

## 2017-10-10 DIAGNOSIS — G8929 Other chronic pain: Secondary | ICD-10-CM | POA: Diagnosis not present

## 2017-10-10 DIAGNOSIS — I5043 Acute on chronic combined systolic (congestive) and diastolic (congestive) heart failure: Secondary | ICD-10-CM | POA: Diagnosis not present

## 2017-10-10 DIAGNOSIS — I484 Atypical atrial flutter: Secondary | ICD-10-CM | POA: Diagnosis not present

## 2017-10-11 DIAGNOSIS — G8929 Other chronic pain: Secondary | ICD-10-CM | POA: Diagnosis not present

## 2017-10-11 DIAGNOSIS — M25532 Pain in left wrist: Secondary | ICD-10-CM | POA: Diagnosis not present

## 2017-10-11 DIAGNOSIS — I5043 Acute on chronic combined systolic (congestive) and diastolic (congestive) heart failure: Secondary | ICD-10-CM | POA: Diagnosis not present

## 2017-10-13 DIAGNOSIS — M25532 Pain in left wrist: Secondary | ICD-10-CM | POA: Diagnosis not present

## 2017-10-13 DIAGNOSIS — G8929 Other chronic pain: Secondary | ICD-10-CM | POA: Diagnosis not present

## 2017-10-13 DIAGNOSIS — I5043 Acute on chronic combined systolic (congestive) and diastolic (congestive) heart failure: Secondary | ICD-10-CM | POA: Diagnosis not present

## 2017-10-14 DIAGNOSIS — G8929 Other chronic pain: Secondary | ICD-10-CM | POA: Diagnosis not present

## 2017-10-14 DIAGNOSIS — M25532 Pain in left wrist: Secondary | ICD-10-CM | POA: Diagnosis not present

## 2017-10-14 DIAGNOSIS — I5043 Acute on chronic combined systolic (congestive) and diastolic (congestive) heart failure: Secondary | ICD-10-CM | POA: Diagnosis not present

## 2017-10-15 DIAGNOSIS — E1042 Type 1 diabetes mellitus with diabetic polyneuropathy: Secondary | ICD-10-CM | POA: Diagnosis not present

## 2017-10-15 DIAGNOSIS — N2889 Other specified disorders of kidney and ureter: Secondary | ICD-10-CM | POA: Diagnosis not present

## 2017-10-15 DIAGNOSIS — I5043 Acute on chronic combined systolic (congestive) and diastolic (congestive) heart failure: Secondary | ICD-10-CM | POA: Diagnosis not present

## 2017-10-15 DIAGNOSIS — G8929 Other chronic pain: Secondary | ICD-10-CM | POA: Diagnosis not present

## 2017-10-15 DIAGNOSIS — M25532 Pain in left wrist: Secondary | ICD-10-CM | POA: Diagnosis not present

## 2017-10-16 DIAGNOSIS — I5043 Acute on chronic combined systolic (congestive) and diastolic (congestive) heart failure: Secondary | ICD-10-CM | POA: Diagnosis not present

## 2017-10-16 DIAGNOSIS — M25532 Pain in left wrist: Secondary | ICD-10-CM | POA: Diagnosis not present

## 2017-10-16 DIAGNOSIS — G8929 Other chronic pain: Secondary | ICD-10-CM | POA: Diagnosis not present

## 2017-10-17 DIAGNOSIS — I5043 Acute on chronic combined systolic (congestive) and diastolic (congestive) heart failure: Secondary | ICD-10-CM | POA: Diagnosis not present

## 2017-10-17 DIAGNOSIS — I484 Atypical atrial flutter: Secondary | ICD-10-CM | POA: Diagnosis not present

## 2017-10-17 DIAGNOSIS — M25532 Pain in left wrist: Secondary | ICD-10-CM | POA: Diagnosis not present

## 2017-10-17 DIAGNOSIS — G8929 Other chronic pain: Secondary | ICD-10-CM | POA: Diagnosis not present

## 2017-10-17 DIAGNOSIS — I10 Essential (primary) hypertension: Secondary | ICD-10-CM | POA: Diagnosis not present

## 2017-10-20 DIAGNOSIS — M25532 Pain in left wrist: Secondary | ICD-10-CM | POA: Diagnosis not present

## 2017-10-20 DIAGNOSIS — G8929 Other chronic pain: Secondary | ICD-10-CM | POA: Diagnosis not present

## 2017-10-20 DIAGNOSIS — E08311 Diabetes mellitus due to underlying condition with unspecified diabetic retinopathy with macular edema: Secondary | ICD-10-CM | POA: Diagnosis not present

## 2017-10-20 DIAGNOSIS — I484 Atypical atrial flutter: Secondary | ICD-10-CM | POA: Diagnosis not present

## 2017-10-20 DIAGNOSIS — I10 Essential (primary) hypertension: Secondary | ICD-10-CM | POA: Diagnosis not present

## 2017-10-20 DIAGNOSIS — I5043 Acute on chronic combined systolic (congestive) and diastolic (congestive) heart failure: Secondary | ICD-10-CM | POA: Diagnosis not present

## 2017-10-21 DIAGNOSIS — G8929 Other chronic pain: Secondary | ICD-10-CM | POA: Diagnosis not present

## 2017-10-21 DIAGNOSIS — I5043 Acute on chronic combined systolic (congestive) and diastolic (congestive) heart failure: Secondary | ICD-10-CM | POA: Diagnosis not present

## 2017-10-21 DIAGNOSIS — M25532 Pain in left wrist: Secondary | ICD-10-CM | POA: Diagnosis not present

## 2017-10-22 DIAGNOSIS — I5022 Chronic systolic (congestive) heart failure: Secondary | ICD-10-CM | POA: Diagnosis not present

## 2017-10-22 DIAGNOSIS — E1042 Type 1 diabetes mellitus with diabetic polyneuropathy: Secondary | ICD-10-CM | POA: Diagnosis not present

## 2017-10-22 DIAGNOSIS — N2889 Other specified disorders of kidney and ureter: Secondary | ICD-10-CM | POA: Diagnosis not present

## 2017-10-22 DIAGNOSIS — M25532 Pain in left wrist: Secondary | ICD-10-CM | POA: Diagnosis not present

## 2017-10-22 DIAGNOSIS — G8929 Other chronic pain: Secondary | ICD-10-CM | POA: Diagnosis not present

## 2017-10-22 DIAGNOSIS — I5043 Acute on chronic combined systolic (congestive) and diastolic (congestive) heart failure: Secondary | ICD-10-CM | POA: Diagnosis not present

## 2017-10-23 DIAGNOSIS — M25532 Pain in left wrist: Secondary | ICD-10-CM | POA: Diagnosis not present

## 2017-10-23 DIAGNOSIS — I5043 Acute on chronic combined systolic (congestive) and diastolic (congestive) heart failure: Secondary | ICD-10-CM | POA: Diagnosis not present

## 2017-10-23 DIAGNOSIS — G8929 Other chronic pain: Secondary | ICD-10-CM | POA: Diagnosis not present

## 2017-10-24 DIAGNOSIS — R140 Abdominal distension (gaseous): Secondary | ICD-10-CM | POA: Diagnosis not present

## 2017-10-24 DIAGNOSIS — I5043 Acute on chronic combined systolic (congestive) and diastolic (congestive) heart failure: Secondary | ICD-10-CM | POA: Diagnosis not present

## 2017-10-24 DIAGNOSIS — M25532 Pain in left wrist: Secondary | ICD-10-CM | POA: Diagnosis not present

## 2017-10-24 DIAGNOSIS — I129 Hypertensive chronic kidney disease with stage 1 through stage 4 chronic kidney disease, or unspecified chronic kidney disease: Secondary | ICD-10-CM | POA: Diagnosis not present

## 2017-10-24 DIAGNOSIS — R1030 Lower abdominal pain, unspecified: Secondary | ICD-10-CM | POA: Diagnosis not present

## 2017-10-24 DIAGNOSIS — G8929 Other chronic pain: Secondary | ICD-10-CM | POA: Diagnosis not present

## 2017-10-27 DIAGNOSIS — I5043 Acute on chronic combined systolic (congestive) and diastolic (congestive) heart failure: Secondary | ICD-10-CM | POA: Diagnosis not present

## 2017-10-27 DIAGNOSIS — G8929 Other chronic pain: Secondary | ICD-10-CM | POA: Diagnosis not present

## 2017-10-27 DIAGNOSIS — M25532 Pain in left wrist: Secondary | ICD-10-CM | POA: Diagnosis not present

## 2017-11-03 DIAGNOSIS — M25522 Pain in left elbow: Secondary | ICD-10-CM | POA: Diagnosis not present

## 2017-11-03 DIAGNOSIS — M79605 Pain in left leg: Secondary | ICD-10-CM | POA: Diagnosis not present

## 2017-11-03 DIAGNOSIS — R600 Localized edema: Secondary | ICD-10-CM | POA: Diagnosis not present

## 2017-11-03 DIAGNOSIS — M79632 Pain in left forearm: Secondary | ICD-10-CM | POA: Diagnosis not present

## 2017-11-04 DIAGNOSIS — G8929 Other chronic pain: Secondary | ICD-10-CM | POA: Diagnosis not present

## 2017-11-04 DIAGNOSIS — I5043 Acute on chronic combined systolic (congestive) and diastolic (congestive) heart failure: Secondary | ICD-10-CM | POA: Diagnosis not present

## 2017-11-04 DIAGNOSIS — M25532 Pain in left wrist: Secondary | ICD-10-CM | POA: Diagnosis not present

## 2017-11-05 DIAGNOSIS — N2889 Other specified disorders of kidney and ureter: Secondary | ICD-10-CM | POA: Diagnosis not present

## 2017-11-05 DIAGNOSIS — E1042 Type 1 diabetes mellitus with diabetic polyneuropathy: Secondary | ICD-10-CM | POA: Diagnosis not present

## 2017-11-05 DIAGNOSIS — I5022 Chronic systolic (congestive) heart failure: Secondary | ICD-10-CM | POA: Diagnosis not present

## 2017-11-07 DIAGNOSIS — N2889 Other specified disorders of kidney and ureter: Secondary | ICD-10-CM | POA: Diagnosis not present

## 2017-11-07 DIAGNOSIS — I129 Hypertensive chronic kidney disease with stage 1 through stage 4 chronic kidney disease, or unspecified chronic kidney disease: Secondary | ICD-10-CM | POA: Diagnosis not present

## 2017-11-07 DIAGNOSIS — R600 Localized edema: Secondary | ICD-10-CM | POA: Diagnosis not present

## 2017-11-10 ENCOUNTER — Emergency Department (HOSPITAL_COMMUNITY): Payer: Medicare Other

## 2017-11-10 ENCOUNTER — Encounter (HOSPITAL_COMMUNITY): Payer: Self-pay | Admitting: Emergency Medicine

## 2017-11-10 ENCOUNTER — Other Ambulatory Visit: Payer: Self-pay

## 2017-11-10 ENCOUNTER — Emergency Department (HOSPITAL_COMMUNITY)
Admission: EM | Admit: 2017-11-10 | Discharge: 2017-11-10 | Disposition: A | Discharge status: Home / Self Care | Payer: Medicare Other | Attending: Emergency Medicine | Admitting: Emergency Medicine

## 2017-11-10 DIAGNOSIS — I5043 Acute on chronic combined systolic (congestive) and diastolic (congestive) heart failure: Secondary | ICD-10-CM | POA: Insufficient documentation

## 2017-11-10 DIAGNOSIS — I129 Hypertensive chronic kidney disease with stage 1 through stage 4 chronic kidney disease, or unspecified chronic kidney disease: Secondary | ICD-10-CM | POA: Diagnosis not present

## 2017-11-10 DIAGNOSIS — Z794 Long term (current) use of insulin: Secondary | ICD-10-CM | POA: Insufficient documentation

## 2017-11-10 DIAGNOSIS — Z87891 Personal history of nicotine dependence: Secondary | ICD-10-CM | POA: Insufficient documentation

## 2017-11-10 DIAGNOSIS — Z79899 Other long term (current) drug therapy: Secondary | ICD-10-CM | POA: Diagnosis not present

## 2017-11-10 DIAGNOSIS — K4020 Bilateral inguinal hernia, without obstruction or gangrene, not specified as recurrent: Secondary | ICD-10-CM | POA: Diagnosis not present

## 2017-11-10 DIAGNOSIS — Z950 Presence of cardiac pacemaker: Secondary | ICD-10-CM | POA: Insufficient documentation

## 2017-11-10 DIAGNOSIS — Z7901 Long term (current) use of anticoagulants: Secondary | ICD-10-CM | POA: Diagnosis not present

## 2017-11-10 DIAGNOSIS — R1084 Generalized abdominal pain: Secondary | ICD-10-CM | POA: Diagnosis not present

## 2017-11-10 DIAGNOSIS — K8020 Calculus of gallbladder without cholecystitis without obstruction: Secondary | ICD-10-CM

## 2017-11-10 DIAGNOSIS — N183 Chronic kidney disease, stage 3 unspecified: Secondary | ICD-10-CM

## 2017-11-10 DIAGNOSIS — I130 Hypertensive heart and chronic kidney disease with heart failure and stage 1 through stage 4 chronic kidney disease, or unspecified chronic kidney disease: Secondary | ICD-10-CM | POA: Insufficient documentation

## 2017-11-10 DIAGNOSIS — R109 Unspecified abdominal pain: Secondary | ICD-10-CM | POA: Diagnosis present

## 2017-11-10 DIAGNOSIS — E1122 Type 2 diabetes mellitus with diabetic chronic kidney disease: Secondary | ICD-10-CM | POA: Insufficient documentation

## 2017-11-10 LAB — COMPREHENSIVE METABOLIC PANEL
ALBUMIN: 2.9 g/dL — AB (ref 3.5–5.0)
ALT: 15 U/L — AB (ref 17–63)
AST: 20 U/L (ref 15–41)
Alkaline Phosphatase: 34 U/L — ABNORMAL LOW (ref 38–126)
Anion gap: 6 (ref 5–15)
BUN: 32 mg/dL — ABNORMAL HIGH (ref 6–20)
CO2: 31 mmol/L (ref 22–32)
CREATININE: 1.93 mg/dL — AB (ref 0.61–1.24)
Calcium: 8.8 mg/dL — ABNORMAL LOW (ref 8.9–10.3)
Chloride: 101 mmol/L (ref 101–111)
GFR calc non Af Amer: 35 mL/min — ABNORMAL LOW (ref >60)
GFR, EST AFRICAN AMERICAN: 41 mL/min — AB (ref >60)
GLUCOSE: 129 mg/dL — AB (ref 65–99)
Potassium: 3.9 mmol/L (ref 3.5–5.1)
SODIUM: 138 mmol/L (ref 135–145)
Total Bilirubin: 0.7 mg/dL (ref 0.3–1.2)
Total Protein: 7.4 g/dL (ref 6.5–8.1)

## 2017-11-10 LAB — PROTIME-INR
INR: 1.84
PROTHROMBIN TIME: 21.1 s — AB (ref 11.4–15.2)

## 2017-11-10 LAB — CBC
HCT: 37.3 % — ABNORMAL LOW (ref 39.0–52.0)
HEMOGLOBIN: 12.4 g/dL — AB (ref 13.0–17.0)
MCH: 33.1 pg (ref 26.0–34.0)
MCHC: 33.2 g/dL (ref 30.0–36.0)
MCV: 99.5 fL (ref 78.0–100.0)
PLATELETS: 255 10*3/uL (ref 150–400)
RBC: 3.75 MIL/uL — AB (ref 4.22–5.81)
RDW: 14.6 % (ref 11.5–15.5)
WBC: 12.3 10*3/uL — AB (ref 4.0–10.5)

## 2017-11-10 LAB — LIPASE, BLOOD: Lipase: 30 U/L (ref 11–51)

## 2017-11-10 MED ORDER — OXYCODONE-ACETAMINOPHEN 5-325 MG PO TABS
1.0000 | ORAL_TABLET | Freq: Once | ORAL | Status: AC
  Administered 2017-11-10: 1 via ORAL
  Filled 2017-11-10: qty 1

## 2017-11-10 MED ORDER — SODIUM CHLORIDE 0.9 % IV SOLN
INTRAVENOUS | Status: DC
Start: 2017-11-10 — End: 2017-11-10

## 2017-11-10 MED ORDER — FUROSEMIDE 10 MG/ML IJ SOLN
80.0000 mg | Freq: Once | INTRAMUSCULAR | Status: AC
  Administered 2017-11-10: 80 mg via INTRAVENOUS
  Filled 2017-11-10: qty 8

## 2017-11-10 NOTE — ED Triage Notes (Signed)
Abd pain x2 days. Distended, renal patient-not on dialysis, 36 lbs weight gain in last month. LBM 1/31. Denies fever or chills.

## 2017-11-10 NOTE — ED Provider Notes (Signed)
[place] DEPT Provider Note   CSN: [id number] Arrival date & time: 11/10/17  1102     History   Chief Complaint Chief Complaint  Patient presents with  . Abdominal Pain    HPI Kurt Cross is a 67 y.o. male.  Patient c/o mid/diffuse abd pain for the past 2-3 days. Symptoms moderate, constant, persistent, without specific exacerbating or alleviating factors. +weight gain ?30 lbs in past month. Denies vomiting. +constipation, last bm being a few days ago. Denies hx pud or pancreatitis. Pt denies hx gallstones. Remote hx ventral hernia repair. Denies chest pain or sob. No cough or uri c/o. No fever or chills. No dysuria or gu c/o.    The history is provided by the patient.  Abdominal Pain   Pertinent negatives include fever, vomiting, dysuria and headaches.    Past Medical History:  Diagnosis Date  . AAA (abdominal aortic aneurysm)/ 4.5 cm ascending per CT angio 08/01/13 08/12/2013  . Atrial fibrillation ([place])   . Benign hypertension   . Bradycardia   . Cellulitis   . CHF (congestive heart failure), NYHA class II ([place])   . Chronic respiratory failure ([place]) 02/10/2016  . Constipation, chronic 08/03/2013  . Cough   . Debility 02/10/2016  . Diabetes mellitus, type 2 ([place])   . Hernia of abdominal cavity   . Hx of Mobitz type II block    s/p PPM Bi-Ven  . Hyperlipidemia   . Hypertension   . Hypokalemia   . Hypoxemia   . 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 Kidney failure   . Neuropathy   . OSA (obstructive sleep apnea)   . Sleep apnea     Patient Active Problem List   Diagnosis Date Noted  . S/P placement of cardiac pacemaker 02/22/2016  . Atrial flutter ([place]) 02/22/2016  . Chronic anticoagulation 02/22/2016  . H/O medication noncompliance 02/22/2016  . Debility 02/10/2016  . Chronic respiratory failure ([place]) 02/10/2016  . Constipation 02/07/2016  . Bradycardia   . Acute on  chronic combined systolic and diastolic CHF, NYHA class 2 ([place]) 01/27/2016  . Acute on chronic renal failure ([place]) 01/27/2016  . Cellulitis 01/27/2016  . Acute on chronic respiratory failure with hypoxia ([place]) 01/27/2016  . Elevated troponin 01/27/2016  . Hypoxia 01/26/2016  . Atrial fibrillation/flutter 10/06/2013  . 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 Recurrent ventral incisional hernia 08/14/2013  . AAA (abdominal aortic aneurysm)/ 4.5 cm ascending per CT angio 08/01/13 08/12/2013  . Right leg pain 08/11/2013  . Chronic combined systolic and diastolic CHF (congestive heart failure) ([place]) 08/04/2013  . Hypertension 06/28/2012  . Diabetes mellitus, insulin dependent (IDDM), uncontrolled ([place]) 09/01/2007  . Hyperlipidemia 09/01/2007  . Obesity, Class III, BMI 40-49.9 (morbid obesity) ([place]) 09/01/2007  . Obstructive sleep apnea 09/01/2007    Past Surgical History:  Procedure Laterality Date  . APPENDECTOMY    . BOWEL RESECTION  1980s?   ?colectomy/ostomy - Dr Elesa Hacker  . INCISION AND DRAINAGE ABSCESS N/A 08/14/2013   Procedure: INCISION AND DRAINAGE ABSCESS, mesh ;  Surgeon: [name], MD;  Location: [facility] ORS;  Service: General;  Laterality: N/A;  . [place]?   Dr. Lindon Romp - w mesh  . KNEE SURGERY Right   . LEFT HEART CATHETERIZATION WITH CORONARY ANGIOGRAM N/A 03/17/2014   Procedure:  LEFT HEART CATHETERIZATION WITH CORONARY ANGIOGRAM;  Surgeon: [name], MD;  Location: [facility] CATH LAB;  Service: Cardiovascular;  Laterality: N/A;  . NASAL SEPTUM SURGERY    . OSTOMY TAKE DOWN  1980s?   Dr Elesa Hacker  . TONSILLECTOMY AND ADENOIDECTOMY         Home Medications    Prior to Admission medications   Medication Sig Start Date End Date Taking? Authorizing Provider  barrier cream (NON-SPECIFIED) CREA Apply 1 application topically 3 (three)  times daily. APPLY TO GROIN AND SACRUM   Yes [provider]  Calcium-Magnesium-Vitamin D (CALCIUM 500 PO) Take 500 mg by mouth daily.   Yes [provider]  carvedilol (COREG) 25 MG tablet Take 25 mg by mouth 2 (two) times daily. 07/25/14  Yes [provider]  cephALEXin (KEFLEX) 250 MG capsule Take 250 mg by mouth at bedtime. 10/09/17  Yes [provider]  cyclobenzaprine (FLEXERIL) 5 MG tablet Take 5 mg by mouth 3 (three) times daily.   Yes [provider]  furosemide (LASIX) 80 MG tablet Take 80 mg by mouth every other day.    Yes [provider]  gabapentin (NEURONTIN) 100 MG capsule Take 200 mg by mouth every evening. 11/01/17  Yes [provider]  gabapentin (NEURONTIN) 300 MG capsule Take 300 mg by mouth every morning. 10/17/17  Yes [provider]  insulin lispro (HUMALOG) 100 UNIT/ML injection Inject into the skin 3 (three) times daily with meals. SLIDING SCALE-<200=0, 201-250=2 UNITS, 251-300=4 UNITS, 301-350=6 UNITS, 351-400=8 UNITS, 401-450=10 UNITS, 451-500=12 UNITS, <50 OR >500=CALL PEC   Yes [provider]  isosorbide-hydrALAZINE (BIDIL) 20-37.5 MG tablet Take 1 tablet by mouth 2 (two) times daily. 02/22/16  Yes Dorothy Spark, MD  OVER THE COUNTER MEDICATION Take 1 tablet by mouth daily. VITAMIN B-9 WITH FOLIC ACID   Yes [provider]  oxyCODONE-acetaminophen (PERCOCET/ROXICET) 5-325 MG tablet Take one tablet by mouth every four hours for pain. Do not exceed 4gm of Tylenol in 24 hours 02/19/16  Yes Reed, Tiffany L, DO  polyethylene glycol (MIRALAX / GLYCOLAX) packet Take 17 g by mouth daily.   Yes [provider]  senna-docusate (SENOKOT-S) 8.6-50 MG tablet Take 2 tablets by mouth 2 (two) times daily. 02/05/16  Yes Elgergawy, Silver Huguenin, MD  Skin Protectants, Misc. (EUCERIN) cream Apply 1 application topically at bedtime. APPLY TO SKIN EVERY NOOGHT AT BEDTIME TO GROIN AND TINY OPEN AREA ON  SCROTUM   Yes [provider]  spironolactone (ALDACTONE) 25 MG tablet Take 1 tablet (25 mg total) by mouth 2 (two) times daily. 02/22/16  Yes Dorothy Spark, MD  TOUJEO SOLOSTAR 300 UNIT/ML SOPN Inject 90 Units into the skin at bedtime. 10/31/17  Yes [provider]  warfarin (COUMADIN) 3 MG tablet Take 3 mg by mouth daily. TAKE 3 MG DAILY WITH 4 MG TO EQUAL 7 MG   Yes [provider]  warfarin (COUMADIN) 4 MG tablet Take 4 mg by mouth daily. TAKE 4 MG DAILY WITH 3 MG TO EQUAL 7 MG   Yes [provider]  albuterol (PROVENTIL) (2.5 MG/3ML) 0.083% nebulizer solution Take 3 mLs (2.5 mg total) by nebulization every 4 (four) hours as needed for wheezing or shortness of breath. Patient not taking: Reported on 11/10/2017 02/05/16   Elgergawy, Silver Huguenin, MD  fentaNYL (DURAGESIC - DOSED MCG/HR) 12 MCG/HR Place 1 patch onto the skin every 3 (three) days. 10/27/17   [provider]  rosuvastatin (  CRESTOR) 10 MG tablet Take 10 mg by mouth daily.    [provider]    Family History Family History  Problem Relation Age of Onset  . Diabetes Mother   . Alcoholism Father   . Heart Problems Maternal Grandmother     Social History Social History   Tobacco Use  . Smoking status: Former Smoker    Packs/day: 1.00    Years: 40.00    Pack years: 40.00    Types: Cigarettes    Last attempt to quit: 08/21/2012    Years since quitting: 5.2  . Smokeless tobacco: Never Used  Substance Use Topics  . Alcohol use: Yes    Alcohol/week: 0.0 oz    Comment: occasional  . Drug use: No     Allergies   Patient has no known allergies.   Review of Systems Review of Systems  Constitutional: Negative for fever.  HENT: Negative for sore throat.   Eyes: Negative for redness.  Respiratory: Negative for shortness of breath.   Cardiovascular: Negative for chest pain.  Gastrointestinal: Positive for abdominal pain. Negative for vomiting.  Endocrine: Negative for  polyuria.  Genitourinary: Negative for dysuria and flank pain.  Musculoskeletal: Negative for back pain and neck pain.  Skin: Negative for rash.  Neurological: Negative for headaches.  Hematological: Does not bruise/bleed easily.  Psychiatric/Behavioral: Negative for confusion.     Physical Exam Updated Vital Signs BP (!) 149/70   Pulse 77   Temp 98 F (36.7 C) (Oral)   Resp 15   SpO2 98%   Physical Exam  Constitutional: He appears well-developed and well-nourished. No distress.  HENT:  Mouth/Throat: Oropharynx is clear and moist.  Eyes: Conjunctivae are normal. No scleral icterus.  Neck: Neck supple. No tracheal deviation present.  Cardiovascular: Normal rate, regular rhythm, normal heart sounds and intact distal pulses.  Pulmonary/Chest: Effort normal and breath sounds normal. No accessory muscle usage. No respiratory distress.  Abdominal: Soft. Bowel sounds are normal. He exhibits no distension and no mass. There is tenderness. There is no rebound and no guarding.  Morbidly obese. Diffuse tenderness.   Genitourinary:  Genitourinary Comments: No cva tenderness  Musculoskeletal: He exhibits no edema.  Neurological: He is alert.  Skin: Skin is warm and dry. No rash noted. He is not diaphoretic.  Psychiatric: He has a normal mood and affect.  Nursing note and vitals reviewed.    ED Treatments / Results  Labs (all labs ordered are listed, but only abnormal results are displayed) Results for orders placed or performed during the hospital encounter of 11/10/17  CBC  Result Value Ref Range   WBC 12.3 (H) 4.0 - 10.5 K/uL   RBC 3.75 (L) 4.22 - 5.81 MIL/uL   Hemoglobin 12.4 (L) 13.0 - 17.0 g/dL   HCT 37.3 (L) 39.0 - 52.0 %   MCV 99.5 78.0 - 100.0 fL   MCH 33.1 26.0 - 34.0 pg   MCHC 33.2 30.0 - 36.0 g/dL   RDW 14.6 11.5 - 15.5 %   Platelets 255 150 - 400 K/uL  Comprehensive metabolic panel  Result Value Ref Range   Sodium 138 135 - 145 mmol/L   Potassium 3.9 3.5 - 5.1  mmol/L   Chloride 101 101 - 111 mmol/L   CO2 31 22 - 32 mmol/L   Glucose, Bld 129 (H) 65 - 99 mg/dL   BUN 32 (H) 6 - 20 mg/dL   Creatinine, Ser 1.93 (H) 0.61 - 1.24 mg/dL   Calcium 8.8 (  L) 8.9 - 10.3 mg/dL   Total Protein 7.4 6.5 - 8.1 g/dL   Albumin 2.9 (L) 3.5 - 5.0 g/dL   AST 20 15 - 41 U/L   ALT 15 (L) 17 - 63 U/L   Alkaline Phosphatase 34 (L) 38 - 126 U/L   Total Bilirubin 0.7 0.3 - 1.2 mg/dL   GFR calc non Af Amer 35 (L) >60 mL/min   GFR calc Af Amer 41 (L) >60 mL/min   Anion gap 6 5 - 15  Lipase, blood  Result Value Ref Range   Lipase 30 11 - 51 U/L  Protime-INR  Result Value Ref Range   Prothrombin Time 21.1 (H) 11.4 - 15.2 seconds   INR 1.84     EKG  EKG Interpretation None       Radiology Ct Abdomen Pelvis Wo Contrast  Result Date: 11/10/2017 CLINICAL DATA:  Abdominal distention, pain EXAM: CT ABDOMEN AND PELVIS WITHOUT CONTRAST TECHNIQUE: Multidetector CT imaging of the abdomen and pelvis was performed following the standard protocol without IV contrast. COMPARISON:  CT 09/16/2017 FINDINGS: Lower chest: No acute findings. Cardiomegaly. Pacer wires in the right heart. Hepatobiliary: Gallstones fill the gallbladder which is contracted. No focal hepatic abnormality. Pancreas: Mild fatty replacement. No focal abnormality or ductal dilatation. Spleen: No focal abnormality.  Normal size. Adrenals/Urinary Tract: Fullness of the adrenal glands bilaterally compatible with hyperplasia, stable. No focal renal abnormality, stones or hydronephrosis. Urinary bladder unremarkable. Stomach/Bowel: Stomach, large and small bowel grossly unremarkable. Moderate stool burden. Vascular/Lymphatic: Aortic and iliac calcifications. No evidence of aneurysm or adenopathy. Reproductive: No visible focal abnormality. Other: No free fluid or free air. Small bilateral inguinal hernias containing fat. Musculoskeletal: No acute bony abnormality. IMPRESSION: Cholelithiasis. Bilateral adrenal fullness  compatible with hyperplasia. Moderate stool burden in the colon. Small bilateral inguinal hernias containing fat. No acute findings in the abdomen or pelvis. Electronically Signed   By: Rolm Baptise M.D.   On: 11/10/2017 13:24    Procedures Procedures (including critical care time)  Medications Ordered in ED Medications  0.9 %  sodium chloride infusion (not administered)     Initial Impression / Assessment and Plan / ED Course  I have reviewed the triage vital signs and the nursing notes.  Pertinent labs & imaging results that were available during my care of the patient were reviewed by me and considered in my medical decision making (see chart for details).  Iv ns bolus. Labs.  Dilaudid .5 mg iv.  zofran iv.  Reviewed nursing notes and prior charts for additional history.   CT.  CT w gallstones -  Gallstones were made note of on prior/remote imaging study as well.  Otherwise no acute process noted on imaging.   No increased wob. Room air pulse ox 95%.   Will give patient his normal dose of lasix iv.   Recheck, abd soft nt. No nv. Afebrile. No increased wob.   Discussed ct w pt.   Pt appears stable for d/c. Recommend close outpt f/u w pcp.     Final Clinical Impressions(s) / ED Diagnoses   Final diagnoses:  None    ED Discharge Orders    None       Lajean Saver, MD 11/10/17 [phone]

## 2017-11-10 NOTE — ED Notes (Signed)
Waiting for [facility] for transport.

## 2017-11-10 NOTE — ED Notes (Signed)
Bed: [facility] Expected date:  Expected time:  Means of arrival:  Comments: Room 10

## 2017-11-10 NOTE — ED Notes (Signed)
Urinal @ bedside. Pt has been informed about UA

## 2017-11-10 NOTE — ED Notes (Signed)
Bed: [facility] Expected date:  Expected time:  Means of arrival:  Comments: EMS-abdominal distention

## 2017-11-10 NOTE — Discharge Instructions (Signed)
It was our pleasure to provide your ER care today - we hope that you feel better.  Your CT scan was read as showing gallstones.  Incidental note was also made of a moderate amount of stool in your colon, and bilateral inguinal hernias.  If constipated, take colace (stool softener) and miralax (laxative) as need.   Continue your medication, including lasix. Limit salt intake.   Follow up with primary care doctor in the coming week for recheck - call office to arrange appointment.   For gallstones, follow up with general surgeon as outpatient.  Return to ER if worse, new symptoms, fevers, severe or intractable abdominal pain, persistent vomiting, increased difficulty breathing, or other concern.

## 2017-11-11 DIAGNOSIS — M545 Low back pain: Secondary | ICD-10-CM | POA: Diagnosis not present

## 2017-11-11 DIAGNOSIS — R29898 Other symptoms and signs involving the musculoskeletal system: Secondary | ICD-10-CM | POA: Diagnosis not present

## 2017-11-11 DIAGNOSIS — G8929 Other chronic pain: Secondary | ICD-10-CM | POA: Diagnosis not present

## 2017-11-12 DIAGNOSIS — G8929 Other chronic pain: Secondary | ICD-10-CM | POA: Diagnosis not present

## 2017-11-12 DIAGNOSIS — I5022 Chronic systolic (congestive) heart failure: Secondary | ICD-10-CM | POA: Diagnosis not present

## 2017-11-12 DIAGNOSIS — S52125S Nondisplaced fracture of head of left radius, sequela: Secondary | ICD-10-CM | POA: Diagnosis not present

## 2017-11-12 DIAGNOSIS — E1042 Type 1 diabetes mellitus with diabetic polyneuropathy: Secondary | ICD-10-CM | POA: Diagnosis not present

## 2017-11-12 DIAGNOSIS — N2889 Other specified disorders of kidney and ureter: Secondary | ICD-10-CM | POA: Diagnosis not present

## 2017-11-12 DIAGNOSIS — M25532 Pain in left wrist: Secondary | ICD-10-CM | POA: Diagnosis not present

## 2017-11-14 DIAGNOSIS — N2889 Other specified disorders of kidney and ureter: Secondary | ICD-10-CM | POA: Diagnosis not present

## 2017-11-14 DIAGNOSIS — G8929 Other chronic pain: Secondary | ICD-10-CM | POA: Diagnosis not present

## 2017-11-14 DIAGNOSIS — S52125S Nondisplaced fracture of head of left radius, sequela: Secondary | ICD-10-CM | POA: Diagnosis not present

## 2017-11-14 DIAGNOSIS — I5022 Chronic systolic (congestive) heart failure: Secondary | ICD-10-CM | POA: Diagnosis not present

## 2017-11-14 DIAGNOSIS — M25532 Pain in left wrist: Secondary | ICD-10-CM | POA: Diagnosis not present

## 2017-11-17 DIAGNOSIS — I5022 Chronic systolic (congestive) heart failure: Secondary | ICD-10-CM | POA: Diagnosis not present

## 2017-11-18 DIAGNOSIS — G8929 Other chronic pain: Secondary | ICD-10-CM | POA: Diagnosis not present

## 2017-11-18 DIAGNOSIS — M25532 Pain in left wrist: Secondary | ICD-10-CM | POA: Diagnosis not present

## 2017-11-18 DIAGNOSIS — S52125S Nondisplaced fracture of head of left radius, sequela: Secondary | ICD-10-CM | POA: Diagnosis not present

## 2017-11-19 DIAGNOSIS — I5022 Chronic systolic (congestive) heart failure: Secondary | ICD-10-CM | POA: Diagnosis not present

## 2017-11-19 DIAGNOSIS — E1042 Type 1 diabetes mellitus with diabetic polyneuropathy: Secondary | ICD-10-CM | POA: Diagnosis not present

## 2017-11-20 DIAGNOSIS — M25532 Pain in left wrist: Secondary | ICD-10-CM | POA: Diagnosis not present

## 2017-11-20 DIAGNOSIS — S52125S Nondisplaced fracture of head of left radius, sequela: Secondary | ICD-10-CM | POA: Diagnosis not present

## 2017-11-20 DIAGNOSIS — G8929 Other chronic pain: Secondary | ICD-10-CM | POA: Diagnosis not present

## 2017-11-21 DIAGNOSIS — E559 Vitamin D deficiency, unspecified: Secondary | ICD-10-CM | POA: Diagnosis not present

## 2017-11-21 DIAGNOSIS — N2889 Other specified disorders of kidney and ureter: Secondary | ICD-10-CM | POA: Diagnosis not present

## 2017-11-21 DIAGNOSIS — I5022 Chronic systolic (congestive) heart failure: Secondary | ICD-10-CM | POA: Diagnosis not present

## 2017-11-21 DIAGNOSIS — E08311 Diabetes mellitus due to underlying condition with unspecified diabetic retinopathy with macular edema: Secondary | ICD-10-CM | POA: Diagnosis not present

## 2017-11-21 DIAGNOSIS — E0801 Diabetes mellitus due to underlying condition with hyperosmolarity with coma: Secondary | ICD-10-CM | POA: Diagnosis not present

## 2017-11-21 DIAGNOSIS — E1042 Type 1 diabetes mellitus with diabetic polyneuropathy: Secondary | ICD-10-CM | POA: Diagnosis not present

## 2017-11-21 DIAGNOSIS — I10 Essential (primary) hypertension: Secondary | ICD-10-CM | POA: Diagnosis not present

## 2017-11-21 DIAGNOSIS — R5381 Other malaise: Secondary | ICD-10-CM | POA: Diagnosis not present

## 2017-11-21 DIAGNOSIS — D649 Anemia, unspecified: Secondary | ICD-10-CM | POA: Diagnosis not present

## 2017-11-24 DIAGNOSIS — I509 Heart failure, unspecified: Secondary | ICD-10-CM | POA: Diagnosis not present

## 2017-11-24 DIAGNOSIS — R809 Proteinuria, unspecified: Secondary | ICD-10-CM | POA: Diagnosis not present

## 2017-11-24 DIAGNOSIS — E1122 Type 2 diabetes mellitus with diabetic chronic kidney disease: Secondary | ICD-10-CM | POA: Diagnosis not present

## 2017-11-24 DIAGNOSIS — I129 Hypertensive chronic kidney disease with stage 1 through stage 4 chronic kidney disease, or unspecified chronic kidney disease: Secondary | ICD-10-CM | POA: Diagnosis not present

## 2017-11-24 DIAGNOSIS — E782 Mixed hyperlipidemia: Secondary | ICD-10-CM | POA: Diagnosis not present

## 2017-11-24 DIAGNOSIS — E1129 Type 2 diabetes mellitus with other diabetic kidney complication: Secondary | ICD-10-CM | POA: Diagnosis not present

## 2017-11-24 DIAGNOSIS — N183 Chronic kidney disease, stage 3 (moderate): Secondary | ICD-10-CM | POA: Diagnosis not present

## 2017-12-03 DIAGNOSIS — I5043 Acute on chronic combined systolic (congestive) and diastolic (congestive) heart failure: Secondary | ICD-10-CM | POA: Diagnosis not present

## 2017-12-03 DIAGNOSIS — I110 Hypertensive heart disease with heart failure: Secondary | ICD-10-CM | POA: Diagnosis not present

## 2017-12-03 DIAGNOSIS — E1042 Type 1 diabetes mellitus with diabetic polyneuropathy: Secondary | ICD-10-CM | POA: Diagnosis not present

## 2017-12-03 DIAGNOSIS — G4733 Obstructive sleep apnea (adult) (pediatric): Secondary | ICD-10-CM | POA: Diagnosis not present

## 2017-12-03 DIAGNOSIS — J9621 Acute and chronic respiratory failure with hypoxia: Secondary | ICD-10-CM | POA: Diagnosis not present

## 2017-12-03 DIAGNOSIS — M5442 Lumbago with sciatica, left side: Secondary | ICD-10-CM | POA: Diagnosis not present

## 2017-12-03 DIAGNOSIS — N289 Disorder of kidney and ureter, unspecified: Secondary | ICD-10-CM | POA: Diagnosis not present

## 2017-12-03 DIAGNOSIS — M5441 Lumbago with sciatica, right side: Secondary | ICD-10-CM | POA: Diagnosis not present

## 2017-12-03 DIAGNOSIS — I481 Persistent atrial fibrillation: Secondary | ICD-10-CM | POA: Diagnosis not present

## 2017-12-03 DIAGNOSIS — G8929 Other chronic pain: Secondary | ICD-10-CM | POA: Diagnosis not present

## 2017-12-03 DIAGNOSIS — K469 Unspecified abdominal hernia without obstruction or gangrene: Secondary | ICD-10-CM | POA: Diagnosis not present

## 2017-12-04 DIAGNOSIS — N289 Disorder of kidney and ureter, unspecified: Secondary | ICD-10-CM | POA: Diagnosis not present

## 2017-12-04 DIAGNOSIS — I5043 Acute on chronic combined systolic (congestive) and diastolic (congestive) heart failure: Secondary | ICD-10-CM | POA: Diagnosis not present

## 2017-12-04 DIAGNOSIS — I110 Hypertensive heart disease with heart failure: Secondary | ICD-10-CM | POA: Diagnosis not present

## 2017-12-04 DIAGNOSIS — G4733 Obstructive sleep apnea (adult) (pediatric): Secondary | ICD-10-CM | POA: Diagnosis not present

## 2017-12-04 DIAGNOSIS — G8929 Other chronic pain: Secondary | ICD-10-CM | POA: Diagnosis not present

## 2017-12-04 DIAGNOSIS — M5441 Lumbago with sciatica, right side: Secondary | ICD-10-CM | POA: Diagnosis not present

## 2017-12-04 DIAGNOSIS — J9621 Acute and chronic respiratory failure with hypoxia: Secondary | ICD-10-CM | POA: Diagnosis not present

## 2017-12-04 DIAGNOSIS — E1042 Type 1 diabetes mellitus with diabetic polyneuropathy: Secondary | ICD-10-CM | POA: Diagnosis not present

## 2017-12-04 DIAGNOSIS — I481 Persistent atrial fibrillation: Secondary | ICD-10-CM | POA: Diagnosis not present

## 2017-12-04 DIAGNOSIS — M5442 Lumbago with sciatica, left side: Secondary | ICD-10-CM | POA: Diagnosis not present

## 2017-12-04 DIAGNOSIS — K469 Unspecified abdominal hernia without obstruction or gangrene: Secondary | ICD-10-CM | POA: Diagnosis not present

## 2017-12-08 DIAGNOSIS — I481 Persistent atrial fibrillation: Secondary | ICD-10-CM | POA: Diagnosis not present

## 2017-12-08 DIAGNOSIS — M5442 Lumbago with sciatica, left side: Secondary | ICD-10-CM | POA: Diagnosis not present

## 2017-12-08 DIAGNOSIS — G8929 Other chronic pain: Secondary | ICD-10-CM | POA: Diagnosis not present

## 2017-12-08 DIAGNOSIS — G4733 Obstructive sleep apnea (adult) (pediatric): Secondary | ICD-10-CM | POA: Diagnosis not present

## 2017-12-08 DIAGNOSIS — I5043 Acute on chronic combined systolic (congestive) and diastolic (congestive) heart failure: Secondary | ICD-10-CM | POA: Diagnosis not present

## 2017-12-08 DIAGNOSIS — E1042 Type 1 diabetes mellitus with diabetic polyneuropathy: Secondary | ICD-10-CM | POA: Diagnosis not present

## 2017-12-08 DIAGNOSIS — J9621 Acute and chronic respiratory failure with hypoxia: Secondary | ICD-10-CM | POA: Diagnosis not present

## 2017-12-08 DIAGNOSIS — I110 Hypertensive heart disease with heart failure: Secondary | ICD-10-CM | POA: Diagnosis not present

## 2017-12-08 DIAGNOSIS — K469 Unspecified abdominal hernia without obstruction or gangrene: Secondary | ICD-10-CM | POA: Diagnosis not present

## 2017-12-08 DIAGNOSIS — M5441 Lumbago with sciatica, right side: Secondary | ICD-10-CM | POA: Diagnosis not present

## 2017-12-08 DIAGNOSIS — N289 Disorder of kidney and ureter, unspecified: Secondary | ICD-10-CM | POA: Diagnosis not present

## 2017-12-09 DIAGNOSIS — I10 Essential (primary) hypertension: Secondary | ICD-10-CM | POA: Diagnosis not present

## 2017-12-09 DIAGNOSIS — E119 Type 2 diabetes mellitus without complications: Secondary | ICD-10-CM | POA: Diagnosis not present

## 2017-12-09 DIAGNOSIS — M5442 Lumbago with sciatica, left side: Secondary | ICD-10-CM | POA: Diagnosis not present

## 2017-12-09 DIAGNOSIS — G8929 Other chronic pain: Secondary | ICD-10-CM | POA: Diagnosis not present

## 2017-12-09 DIAGNOSIS — M25561 Pain in right knee: Secondary | ICD-10-CM | POA: Diagnosis not present

## 2017-12-09 DIAGNOSIS — G629 Polyneuropathy, unspecified: Secondary | ICD-10-CM | POA: Diagnosis not present

## 2017-12-09 DIAGNOSIS — M5441 Lumbago with sciatica, right side: Secondary | ICD-10-CM | POA: Diagnosis not present

## 2017-12-09 DIAGNOSIS — E7800 Pure hypercholesterolemia, unspecified: Secondary | ICD-10-CM | POA: Diagnosis not present

## 2017-12-09 DIAGNOSIS — Z0000 Encounter for general adult medical examination without abnormal findings: Secondary | ICD-10-CM | POA: Diagnosis not present

## 2017-12-11 DIAGNOSIS — M5441 Lumbago with sciatica, right side: Secondary | ICD-10-CM | POA: Diagnosis not present

## 2017-12-11 DIAGNOSIS — I481 Persistent atrial fibrillation: Secondary | ICD-10-CM | POA: Diagnosis not present

## 2017-12-11 DIAGNOSIS — J9621 Acute and chronic respiratory failure with hypoxia: Secondary | ICD-10-CM | POA: Diagnosis not present

## 2017-12-11 DIAGNOSIS — M5442 Lumbago with sciatica, left side: Secondary | ICD-10-CM | POA: Diagnosis not present

## 2017-12-11 DIAGNOSIS — N289 Disorder of kidney and ureter, unspecified: Secondary | ICD-10-CM | POA: Diagnosis not present

## 2017-12-11 DIAGNOSIS — E1042 Type 1 diabetes mellitus with diabetic polyneuropathy: Secondary | ICD-10-CM | POA: Diagnosis not present

## 2017-12-11 DIAGNOSIS — K469 Unspecified abdominal hernia without obstruction or gangrene: Secondary | ICD-10-CM | POA: Diagnosis not present

## 2017-12-11 DIAGNOSIS — I110 Hypertensive heart disease with heart failure: Secondary | ICD-10-CM | POA: Diagnosis not present

## 2017-12-11 DIAGNOSIS — G4733 Obstructive sleep apnea (adult) (pediatric): Secondary | ICD-10-CM | POA: Diagnosis not present

## 2017-12-11 DIAGNOSIS — G8929 Other chronic pain: Secondary | ICD-10-CM | POA: Diagnosis not present

## 2017-12-11 DIAGNOSIS — I5043 Acute on chronic combined systolic (congestive) and diastolic (congestive) heart failure: Secondary | ICD-10-CM | POA: Diagnosis not present

## 2017-12-12 DIAGNOSIS — K469 Unspecified abdominal hernia without obstruction or gangrene: Secondary | ICD-10-CM | POA: Diagnosis not present

## 2017-12-12 DIAGNOSIS — N289 Disorder of kidney and ureter, unspecified: Secondary | ICD-10-CM | POA: Diagnosis not present

## 2017-12-12 DIAGNOSIS — I5043 Acute on chronic combined systolic (congestive) and diastolic (congestive) heart failure: Secondary | ICD-10-CM | POA: Diagnosis not present

## 2017-12-12 DIAGNOSIS — I110 Hypertensive heart disease with heart failure: Secondary | ICD-10-CM | POA: Diagnosis not present

## 2017-12-12 DIAGNOSIS — J9621 Acute and chronic respiratory failure with hypoxia: Secondary | ICD-10-CM | POA: Diagnosis not present

## 2017-12-12 DIAGNOSIS — M5442 Lumbago with sciatica, left side: Secondary | ICD-10-CM | POA: Diagnosis not present

## 2017-12-12 DIAGNOSIS — G8929 Other chronic pain: Secondary | ICD-10-CM | POA: Diagnosis not present

## 2017-12-12 DIAGNOSIS — M5441 Lumbago with sciatica, right side: Secondary | ICD-10-CM | POA: Diagnosis not present

## 2017-12-12 DIAGNOSIS — I481 Persistent atrial fibrillation: Secondary | ICD-10-CM | POA: Diagnosis not present

## 2017-12-12 DIAGNOSIS — E1042 Type 1 diabetes mellitus with diabetic polyneuropathy: Secondary | ICD-10-CM | POA: Diagnosis not present

## 2017-12-12 DIAGNOSIS — G4733 Obstructive sleep apnea (adult) (pediatric): Secondary | ICD-10-CM | POA: Diagnosis not present

## 2017-12-15 DIAGNOSIS — I110 Hypertensive heart disease with heart failure: Secondary | ICD-10-CM | POA: Diagnosis not present

## 2017-12-15 DIAGNOSIS — G8929 Other chronic pain: Secondary | ICD-10-CM | POA: Diagnosis not present

## 2017-12-15 DIAGNOSIS — J9621 Acute and chronic respiratory failure with hypoxia: Secondary | ICD-10-CM | POA: Diagnosis not present

## 2017-12-15 DIAGNOSIS — M5442 Lumbago with sciatica, left side: Secondary | ICD-10-CM | POA: Diagnosis not present

## 2017-12-15 DIAGNOSIS — M5441 Lumbago with sciatica, right side: Secondary | ICD-10-CM | POA: Diagnosis not present

## 2017-12-15 DIAGNOSIS — G4733 Obstructive sleep apnea (adult) (pediatric): Secondary | ICD-10-CM | POA: Diagnosis not present

## 2017-12-15 DIAGNOSIS — N289 Disorder of kidney and ureter, unspecified: Secondary | ICD-10-CM | POA: Diagnosis not present

## 2017-12-15 DIAGNOSIS — K469 Unspecified abdominal hernia without obstruction or gangrene: Secondary | ICD-10-CM | POA: Diagnosis not present

## 2017-12-15 DIAGNOSIS — E1042 Type 1 diabetes mellitus with diabetic polyneuropathy: Secondary | ICD-10-CM | POA: Diagnosis not present

## 2017-12-15 DIAGNOSIS — I5043 Acute on chronic combined systolic (congestive) and diastolic (congestive) heart failure: Secondary | ICD-10-CM | POA: Diagnosis not present

## 2017-12-15 DIAGNOSIS — I481 Persistent atrial fibrillation: Secondary | ICD-10-CM | POA: Diagnosis not present

## 2017-12-16 DIAGNOSIS — E1042 Type 1 diabetes mellitus with diabetic polyneuropathy: Secondary | ICD-10-CM | POA: Diagnosis not present

## 2017-12-16 DIAGNOSIS — I481 Persistent atrial fibrillation: Secondary | ICD-10-CM | POA: Diagnosis not present

## 2017-12-16 DIAGNOSIS — M5441 Lumbago with sciatica, right side: Secondary | ICD-10-CM | POA: Diagnosis not present

## 2017-12-16 DIAGNOSIS — K469 Unspecified abdominal hernia without obstruction or gangrene: Secondary | ICD-10-CM | POA: Diagnosis not present

## 2017-12-16 DIAGNOSIS — M5442 Lumbago with sciatica, left side: Secondary | ICD-10-CM | POA: Diagnosis not present

## 2017-12-16 DIAGNOSIS — I110 Hypertensive heart disease with heart failure: Secondary | ICD-10-CM | POA: Diagnosis not present

## 2017-12-16 DIAGNOSIS — J9621 Acute and chronic respiratory failure with hypoxia: Secondary | ICD-10-CM | POA: Diagnosis not present

## 2017-12-16 DIAGNOSIS — N289 Disorder of kidney and ureter, unspecified: Secondary | ICD-10-CM | POA: Diagnosis not present

## 2017-12-16 DIAGNOSIS — I5043 Acute on chronic combined systolic (congestive) and diastolic (congestive) heart failure: Secondary | ICD-10-CM | POA: Diagnosis not present

## 2017-12-16 DIAGNOSIS — G4733 Obstructive sleep apnea (adult) (pediatric): Secondary | ICD-10-CM | POA: Diagnosis not present

## 2017-12-16 DIAGNOSIS — G8929 Other chronic pain: Secondary | ICD-10-CM | POA: Diagnosis not present

## 2017-12-17 DIAGNOSIS — I481 Persistent atrial fibrillation: Secondary | ICD-10-CM | POA: Diagnosis not present

## 2017-12-17 DIAGNOSIS — N289 Disorder of kidney and ureter, unspecified: Secondary | ICD-10-CM | POA: Diagnosis not present

## 2017-12-17 DIAGNOSIS — I110 Hypertensive heart disease with heart failure: Secondary | ICD-10-CM | POA: Diagnosis not present

## 2017-12-17 DIAGNOSIS — K469 Unspecified abdominal hernia without obstruction or gangrene: Secondary | ICD-10-CM | POA: Diagnosis not present

## 2017-12-17 DIAGNOSIS — G4733 Obstructive sleep apnea (adult) (pediatric): Secondary | ICD-10-CM | POA: Diagnosis not present

## 2017-12-17 DIAGNOSIS — E1042 Type 1 diabetes mellitus with diabetic polyneuropathy: Secondary | ICD-10-CM | POA: Diagnosis not present

## 2017-12-17 DIAGNOSIS — M5442 Lumbago with sciatica, left side: Secondary | ICD-10-CM | POA: Diagnosis not present

## 2017-12-17 DIAGNOSIS — I5043 Acute on chronic combined systolic (congestive) and diastolic (congestive) heart failure: Secondary | ICD-10-CM | POA: Diagnosis not present

## 2017-12-17 DIAGNOSIS — J9621 Acute and chronic respiratory failure with hypoxia: Secondary | ICD-10-CM | POA: Diagnosis not present

## 2017-12-17 DIAGNOSIS — G8929 Other chronic pain: Secondary | ICD-10-CM | POA: Diagnosis not present

## 2017-12-17 DIAGNOSIS — M5441 Lumbago with sciatica, right side: Secondary | ICD-10-CM | POA: Diagnosis not present

## 2017-12-18 DIAGNOSIS — M5441 Lumbago with sciatica, right side: Secondary | ICD-10-CM | POA: Diagnosis not present

## 2017-12-18 DIAGNOSIS — K469 Unspecified abdominal hernia without obstruction or gangrene: Secondary | ICD-10-CM | POA: Diagnosis not present

## 2017-12-18 DIAGNOSIS — E1042 Type 1 diabetes mellitus with diabetic polyneuropathy: Secondary | ICD-10-CM | POA: Diagnosis not present

## 2017-12-18 DIAGNOSIS — M5442 Lumbago with sciatica, left side: Secondary | ICD-10-CM | POA: Diagnosis not present

## 2017-12-18 DIAGNOSIS — G4733 Obstructive sleep apnea (adult) (pediatric): Secondary | ICD-10-CM | POA: Diagnosis not present

## 2017-12-18 DIAGNOSIS — I110 Hypertensive heart disease with heart failure: Secondary | ICD-10-CM | POA: Diagnosis not present

## 2017-12-18 DIAGNOSIS — J9621 Acute and chronic respiratory failure with hypoxia: Secondary | ICD-10-CM | POA: Diagnosis not present

## 2017-12-18 DIAGNOSIS — G8929 Other chronic pain: Secondary | ICD-10-CM | POA: Diagnosis not present

## 2017-12-18 DIAGNOSIS — N289 Disorder of kidney and ureter, unspecified: Secondary | ICD-10-CM | POA: Diagnosis not present

## 2017-12-18 DIAGNOSIS — I5043 Acute on chronic combined systolic (congestive) and diastolic (congestive) heart failure: Secondary | ICD-10-CM | POA: Diagnosis not present

## 2017-12-18 DIAGNOSIS — I481 Persistent atrial fibrillation: Secondary | ICD-10-CM | POA: Diagnosis not present

## 2017-12-19 DIAGNOSIS — M5442 Lumbago with sciatica, left side: Secondary | ICD-10-CM | POA: Diagnosis not present

## 2017-12-19 DIAGNOSIS — J9621 Acute and chronic respiratory failure with hypoxia: Secondary | ICD-10-CM | POA: Diagnosis not present

## 2017-12-19 DIAGNOSIS — G8929 Other chronic pain: Secondary | ICD-10-CM | POA: Diagnosis not present

## 2017-12-19 DIAGNOSIS — K469 Unspecified abdominal hernia without obstruction or gangrene: Secondary | ICD-10-CM | POA: Diagnosis not present

## 2017-12-19 DIAGNOSIS — I481 Persistent atrial fibrillation: Secondary | ICD-10-CM | POA: Diagnosis not present

## 2017-12-19 DIAGNOSIS — E1042 Type 1 diabetes mellitus with diabetic polyneuropathy: Secondary | ICD-10-CM | POA: Diagnosis not present

## 2017-12-19 DIAGNOSIS — N289 Disorder of kidney and ureter, unspecified: Secondary | ICD-10-CM | POA: Diagnosis not present

## 2017-12-19 DIAGNOSIS — I5043 Acute on chronic combined systolic (congestive) and diastolic (congestive) heart failure: Secondary | ICD-10-CM | POA: Diagnosis not present

## 2017-12-19 DIAGNOSIS — M5441 Lumbago with sciatica, right side: Secondary | ICD-10-CM | POA: Diagnosis not present

## 2017-12-19 DIAGNOSIS — G4733 Obstructive sleep apnea (adult) (pediatric): Secondary | ICD-10-CM | POA: Diagnosis not present

## 2017-12-19 DIAGNOSIS — I110 Hypertensive heart disease with heart failure: Secondary | ICD-10-CM | POA: Diagnosis not present

## 2017-12-23 DIAGNOSIS — I5043 Acute on chronic combined systolic (congestive) and diastolic (congestive) heart failure: Secondary | ICD-10-CM | POA: Diagnosis not present

## 2017-12-23 DIAGNOSIS — J9621 Acute and chronic respiratory failure with hypoxia: Secondary | ICD-10-CM | POA: Diagnosis not present

## 2017-12-23 DIAGNOSIS — G4733 Obstructive sleep apnea (adult) (pediatric): Secondary | ICD-10-CM | POA: Diagnosis not present

## 2017-12-23 DIAGNOSIS — N289 Disorder of kidney and ureter, unspecified: Secondary | ICD-10-CM | POA: Diagnosis not present

## 2017-12-23 DIAGNOSIS — I481 Persistent atrial fibrillation: Secondary | ICD-10-CM | POA: Diagnosis not present

## 2017-12-23 DIAGNOSIS — G8929 Other chronic pain: Secondary | ICD-10-CM | POA: Diagnosis not present

## 2017-12-23 DIAGNOSIS — I110 Hypertensive heart disease with heart failure: Secondary | ICD-10-CM | POA: Diagnosis not present

## 2017-12-23 DIAGNOSIS — E1042 Type 1 diabetes mellitus with diabetic polyneuropathy: Secondary | ICD-10-CM | POA: Diagnosis not present

## 2017-12-23 DIAGNOSIS — K469 Unspecified abdominal hernia without obstruction or gangrene: Secondary | ICD-10-CM | POA: Diagnosis not present

## 2017-12-23 DIAGNOSIS — M5441 Lumbago with sciatica, right side: Secondary | ICD-10-CM | POA: Diagnosis not present

## 2017-12-23 DIAGNOSIS — M5442 Lumbago with sciatica, left side: Secondary | ICD-10-CM | POA: Diagnosis not present

## 2017-12-24 DIAGNOSIS — I110 Hypertensive heart disease with heart failure: Secondary | ICD-10-CM | POA: Diagnosis not present

## 2017-12-24 DIAGNOSIS — G4733 Obstructive sleep apnea (adult) (pediatric): Secondary | ICD-10-CM | POA: Diagnosis not present

## 2017-12-24 DIAGNOSIS — I481 Persistent atrial fibrillation: Secondary | ICD-10-CM | POA: Diagnosis not present

## 2017-12-24 DIAGNOSIS — M5442 Lumbago with sciatica, left side: Secondary | ICD-10-CM | POA: Diagnosis not present

## 2017-12-24 DIAGNOSIS — N289 Disorder of kidney and ureter, unspecified: Secondary | ICD-10-CM | POA: Diagnosis not present

## 2017-12-24 DIAGNOSIS — M5441 Lumbago with sciatica, right side: Secondary | ICD-10-CM | POA: Diagnosis not present

## 2017-12-24 DIAGNOSIS — K469 Unspecified abdominal hernia without obstruction or gangrene: Secondary | ICD-10-CM | POA: Diagnosis not present

## 2017-12-24 DIAGNOSIS — G8929 Other chronic pain: Secondary | ICD-10-CM | POA: Diagnosis not present

## 2017-12-24 DIAGNOSIS — I5043 Acute on chronic combined systolic (congestive) and diastolic (congestive) heart failure: Secondary | ICD-10-CM | POA: Diagnosis not present

## 2017-12-24 DIAGNOSIS — E1042 Type 1 diabetes mellitus with diabetic polyneuropathy: Secondary | ICD-10-CM | POA: Diagnosis not present

## 2017-12-24 DIAGNOSIS — J9621 Acute and chronic respiratory failure with hypoxia: Secondary | ICD-10-CM | POA: Diagnosis not present

## 2017-12-25 DIAGNOSIS — M5442 Lumbago with sciatica, left side: Secondary | ICD-10-CM | POA: Diagnosis not present

## 2017-12-25 DIAGNOSIS — G8929 Other chronic pain: Secondary | ICD-10-CM | POA: Diagnosis not present

## 2017-12-25 DIAGNOSIS — E1042 Type 1 diabetes mellitus with diabetic polyneuropathy: Secondary | ICD-10-CM | POA: Diagnosis not present

## 2017-12-25 DIAGNOSIS — G4733 Obstructive sleep apnea (adult) (pediatric): Secondary | ICD-10-CM | POA: Diagnosis not present

## 2017-12-25 DIAGNOSIS — I481 Persistent atrial fibrillation: Secondary | ICD-10-CM | POA: Diagnosis not present

## 2017-12-25 DIAGNOSIS — M5441 Lumbago with sciatica, right side: Secondary | ICD-10-CM | POA: Diagnosis not present

## 2017-12-25 DIAGNOSIS — I110 Hypertensive heart disease with heart failure: Secondary | ICD-10-CM | POA: Diagnosis not present

## 2017-12-25 DIAGNOSIS — J9621 Acute and chronic respiratory failure with hypoxia: Secondary | ICD-10-CM | POA: Diagnosis not present

## 2017-12-25 DIAGNOSIS — K469 Unspecified abdominal hernia without obstruction or gangrene: Secondary | ICD-10-CM | POA: Diagnosis not present

## 2017-12-25 DIAGNOSIS — I5043 Acute on chronic combined systolic (congestive) and diastolic (congestive) heart failure: Secondary | ICD-10-CM | POA: Diagnosis not present

## 2017-12-25 DIAGNOSIS — N289 Disorder of kidney and ureter, unspecified: Secondary | ICD-10-CM | POA: Diagnosis not present

## 2017-12-29 DIAGNOSIS — E1042 Type 1 diabetes mellitus with diabetic polyneuropathy: Secondary | ICD-10-CM | POA: Diagnosis not present

## 2017-12-29 DIAGNOSIS — M5442 Lumbago with sciatica, left side: Secondary | ICD-10-CM | POA: Diagnosis not present

## 2017-12-29 DIAGNOSIS — G4733 Obstructive sleep apnea (adult) (pediatric): Secondary | ICD-10-CM | POA: Diagnosis not present

## 2017-12-29 DIAGNOSIS — K469 Unspecified abdominal hernia without obstruction or gangrene: Secondary | ICD-10-CM | POA: Diagnosis not present

## 2017-12-29 DIAGNOSIS — G8929 Other chronic pain: Secondary | ICD-10-CM | POA: Diagnosis not present

## 2017-12-29 DIAGNOSIS — M5441 Lumbago with sciatica, right side: Secondary | ICD-10-CM | POA: Diagnosis not present

## 2017-12-29 DIAGNOSIS — J9621 Acute and chronic respiratory failure with hypoxia: Secondary | ICD-10-CM | POA: Diagnosis not present

## 2017-12-29 DIAGNOSIS — I5043 Acute on chronic combined systolic (congestive) and diastolic (congestive) heart failure: Secondary | ICD-10-CM | POA: Diagnosis not present

## 2017-12-29 DIAGNOSIS — I481 Persistent atrial fibrillation: Secondary | ICD-10-CM | POA: Diagnosis not present

## 2017-12-29 DIAGNOSIS — I110 Hypertensive heart disease with heart failure: Secondary | ICD-10-CM | POA: Diagnosis not present

## 2017-12-29 DIAGNOSIS — N289 Disorder of kidney and ureter, unspecified: Secondary | ICD-10-CM | POA: Diagnosis not present

## 2017-12-31 DIAGNOSIS — K469 Unspecified abdominal hernia without obstruction or gangrene: Secondary | ICD-10-CM | POA: Diagnosis not present

## 2017-12-31 DIAGNOSIS — G8929 Other chronic pain: Secondary | ICD-10-CM | POA: Diagnosis not present

## 2017-12-31 DIAGNOSIS — I481 Persistent atrial fibrillation: Secondary | ICD-10-CM | POA: Diagnosis not present

## 2017-12-31 DIAGNOSIS — N289 Disorder of kidney and ureter, unspecified: Secondary | ICD-10-CM | POA: Diagnosis not present

## 2017-12-31 DIAGNOSIS — I110 Hypertensive heart disease with heart failure: Secondary | ICD-10-CM | POA: Diagnosis not present

## 2017-12-31 DIAGNOSIS — E1042 Type 1 diabetes mellitus with diabetic polyneuropathy: Secondary | ICD-10-CM | POA: Diagnosis not present

## 2017-12-31 DIAGNOSIS — M5442 Lumbago with sciatica, left side: Secondary | ICD-10-CM | POA: Diagnosis not present

## 2017-12-31 DIAGNOSIS — G4733 Obstructive sleep apnea (adult) (pediatric): Secondary | ICD-10-CM | POA: Diagnosis not present

## 2017-12-31 DIAGNOSIS — M5441 Lumbago with sciatica, right side: Secondary | ICD-10-CM | POA: Diagnosis not present

## 2017-12-31 DIAGNOSIS — J9621 Acute and chronic respiratory failure with hypoxia: Secondary | ICD-10-CM | POA: Diagnosis not present

## 2017-12-31 DIAGNOSIS — I5043 Acute on chronic combined systolic (congestive) and diastolic (congestive) heart failure: Secondary | ICD-10-CM | POA: Diagnosis not present

## 2018-01-02 DIAGNOSIS — I481 Persistent atrial fibrillation: Secondary | ICD-10-CM | POA: Diagnosis not present

## 2018-01-02 DIAGNOSIS — J9621 Acute and chronic respiratory failure with hypoxia: Secondary | ICD-10-CM | POA: Diagnosis not present

## 2018-01-02 DIAGNOSIS — E1042 Type 1 diabetes mellitus with diabetic polyneuropathy: Secondary | ICD-10-CM | POA: Diagnosis not present

## 2018-01-02 DIAGNOSIS — G8929 Other chronic pain: Secondary | ICD-10-CM | POA: Diagnosis not present

## 2018-01-02 DIAGNOSIS — M5441 Lumbago with sciatica, right side: Secondary | ICD-10-CM | POA: Diagnosis not present

## 2018-01-02 DIAGNOSIS — I5043 Acute on chronic combined systolic (congestive) and diastolic (congestive) heart failure: Secondary | ICD-10-CM | POA: Diagnosis not present

## 2018-01-02 DIAGNOSIS — K469 Unspecified abdominal hernia without obstruction or gangrene: Secondary | ICD-10-CM | POA: Diagnosis not present

## 2018-01-02 DIAGNOSIS — N289 Disorder of kidney and ureter, unspecified: Secondary | ICD-10-CM | POA: Diagnosis not present

## 2018-01-02 DIAGNOSIS — M5442 Lumbago with sciatica, left side: Secondary | ICD-10-CM | POA: Diagnosis not present

## 2018-01-02 DIAGNOSIS — I110 Hypertensive heart disease with heart failure: Secondary | ICD-10-CM | POA: Diagnosis not present

## 2018-01-02 DIAGNOSIS — G4733 Obstructive sleep apnea (adult) (pediatric): Secondary | ICD-10-CM | POA: Diagnosis not present

## 2018-01-05 DIAGNOSIS — I110 Hypertensive heart disease with heart failure: Secondary | ICD-10-CM | POA: Diagnosis not present

## 2018-01-05 DIAGNOSIS — G8929 Other chronic pain: Secondary | ICD-10-CM | POA: Diagnosis not present

## 2018-01-05 DIAGNOSIS — M5442 Lumbago with sciatica, left side: Secondary | ICD-10-CM | POA: Diagnosis not present

## 2018-01-05 DIAGNOSIS — K469 Unspecified abdominal hernia without obstruction or gangrene: Secondary | ICD-10-CM | POA: Diagnosis not present

## 2018-01-05 DIAGNOSIS — N289 Disorder of kidney and ureter, unspecified: Secondary | ICD-10-CM | POA: Diagnosis not present

## 2018-01-05 DIAGNOSIS — I481 Persistent atrial fibrillation: Secondary | ICD-10-CM | POA: Diagnosis not present

## 2018-01-05 DIAGNOSIS — G4733 Obstructive sleep apnea (adult) (pediatric): Secondary | ICD-10-CM | POA: Diagnosis not present

## 2018-01-05 DIAGNOSIS — M5441 Lumbago with sciatica, right side: Secondary | ICD-10-CM | POA: Diagnosis not present

## 2018-01-05 DIAGNOSIS — E1042 Type 1 diabetes mellitus with diabetic polyneuropathy: Secondary | ICD-10-CM | POA: Diagnosis not present

## 2018-01-05 DIAGNOSIS — I5043 Acute on chronic combined systolic (congestive) and diastolic (congestive) heart failure: Secondary | ICD-10-CM | POA: Diagnosis not present

## 2018-01-05 DIAGNOSIS — J9621 Acute and chronic respiratory failure with hypoxia: Secondary | ICD-10-CM | POA: Diagnosis not present

## 2018-01-06 DIAGNOSIS — N289 Disorder of kidney and ureter, unspecified: Secondary | ICD-10-CM | POA: Diagnosis not present

## 2018-01-06 DIAGNOSIS — K469 Unspecified abdominal hernia without obstruction or gangrene: Secondary | ICD-10-CM | POA: Diagnosis not present

## 2018-01-06 DIAGNOSIS — I110 Hypertensive heart disease with heart failure: Secondary | ICD-10-CM | POA: Diagnosis not present

## 2018-01-06 DIAGNOSIS — E1042 Type 1 diabetes mellitus with diabetic polyneuropathy: Secondary | ICD-10-CM | POA: Diagnosis not present

## 2018-01-06 DIAGNOSIS — J9621 Acute and chronic respiratory failure with hypoxia: Secondary | ICD-10-CM | POA: Diagnosis not present

## 2018-01-06 DIAGNOSIS — I481 Persistent atrial fibrillation: Secondary | ICD-10-CM | POA: Diagnosis not present

## 2018-01-06 DIAGNOSIS — M5442 Lumbago with sciatica, left side: Secondary | ICD-10-CM | POA: Diagnosis not present

## 2018-01-06 DIAGNOSIS — M5441 Lumbago with sciatica, right side: Secondary | ICD-10-CM | POA: Diagnosis not present

## 2018-01-06 DIAGNOSIS — G8929 Other chronic pain: Secondary | ICD-10-CM | POA: Diagnosis not present

## 2018-01-06 DIAGNOSIS — I5043 Acute on chronic combined systolic (congestive) and diastolic (congestive) heart failure: Secondary | ICD-10-CM | POA: Diagnosis not present

## 2018-01-06 DIAGNOSIS — G4733 Obstructive sleep apnea (adult) (pediatric): Secondary | ICD-10-CM | POA: Diagnosis not present

## 2018-01-07 DIAGNOSIS — N289 Disorder of kidney and ureter, unspecified: Secondary | ICD-10-CM | POA: Diagnosis not present

## 2018-01-07 DIAGNOSIS — K469 Unspecified abdominal hernia without obstruction or gangrene: Secondary | ICD-10-CM | POA: Diagnosis not present

## 2018-01-07 DIAGNOSIS — I481 Persistent atrial fibrillation: Secondary | ICD-10-CM | POA: Diagnosis not present

## 2018-01-07 DIAGNOSIS — I110 Hypertensive heart disease with heart failure: Secondary | ICD-10-CM | POA: Diagnosis not present

## 2018-01-07 DIAGNOSIS — J9621 Acute and chronic respiratory failure with hypoxia: Secondary | ICD-10-CM | POA: Diagnosis not present

## 2018-01-07 DIAGNOSIS — I5043 Acute on chronic combined systolic (congestive) and diastolic (congestive) heart failure: Secondary | ICD-10-CM | POA: Diagnosis not present

## 2018-01-07 DIAGNOSIS — G4733 Obstructive sleep apnea (adult) (pediatric): Secondary | ICD-10-CM | POA: Diagnosis not present

## 2018-01-07 DIAGNOSIS — G8929 Other chronic pain: Secondary | ICD-10-CM | POA: Diagnosis not present

## 2018-01-07 DIAGNOSIS — M5441 Lumbago with sciatica, right side: Secondary | ICD-10-CM | POA: Diagnosis not present

## 2018-01-07 DIAGNOSIS — M5442 Lumbago with sciatica, left side: Secondary | ICD-10-CM | POA: Diagnosis not present

## 2018-01-07 DIAGNOSIS — E1042 Type 1 diabetes mellitus with diabetic polyneuropathy: Secondary | ICD-10-CM | POA: Diagnosis not present

## 2018-01-08 DIAGNOSIS — I5043 Acute on chronic combined systolic (congestive) and diastolic (congestive) heart failure: Secondary | ICD-10-CM | POA: Diagnosis not present

## 2018-01-08 DIAGNOSIS — J9621 Acute and chronic respiratory failure with hypoxia: Secondary | ICD-10-CM | POA: Diagnosis not present

## 2018-01-08 DIAGNOSIS — I110 Hypertensive heart disease with heart failure: Secondary | ICD-10-CM | POA: Diagnosis not present

## 2018-01-08 DIAGNOSIS — N289 Disorder of kidney and ureter, unspecified: Secondary | ICD-10-CM | POA: Diagnosis not present

## 2018-01-08 DIAGNOSIS — G4733 Obstructive sleep apnea (adult) (pediatric): Secondary | ICD-10-CM | POA: Diagnosis not present

## 2018-01-08 DIAGNOSIS — K469 Unspecified abdominal hernia without obstruction or gangrene: Secondary | ICD-10-CM | POA: Diagnosis not present

## 2018-01-08 DIAGNOSIS — M5442 Lumbago with sciatica, left side: Secondary | ICD-10-CM | POA: Diagnosis not present

## 2018-01-08 DIAGNOSIS — E1042 Type 1 diabetes mellitus with diabetic polyneuropathy: Secondary | ICD-10-CM | POA: Diagnosis not present

## 2018-01-08 DIAGNOSIS — I481 Persistent atrial fibrillation: Secondary | ICD-10-CM | POA: Diagnosis not present

## 2018-01-08 DIAGNOSIS — M5441 Lumbago with sciatica, right side: Secondary | ICD-10-CM | POA: Diagnosis not present

## 2018-01-08 DIAGNOSIS — G8929 Other chronic pain: Secondary | ICD-10-CM | POA: Diagnosis not present

## 2018-01-09 DIAGNOSIS — I110 Hypertensive heart disease with heart failure: Secondary | ICD-10-CM | POA: Diagnosis not present

## 2018-01-09 DIAGNOSIS — G8929 Other chronic pain: Secondary | ICD-10-CM | POA: Diagnosis not present

## 2018-01-09 DIAGNOSIS — J9621 Acute and chronic respiratory failure with hypoxia: Secondary | ICD-10-CM | POA: Diagnosis not present

## 2018-01-09 DIAGNOSIS — M5442 Lumbago with sciatica, left side: Secondary | ICD-10-CM | POA: Diagnosis not present

## 2018-01-09 DIAGNOSIS — G4733 Obstructive sleep apnea (adult) (pediatric): Secondary | ICD-10-CM | POA: Diagnosis not present

## 2018-01-09 DIAGNOSIS — K469 Unspecified abdominal hernia without obstruction or gangrene: Secondary | ICD-10-CM | POA: Diagnosis not present

## 2018-01-09 DIAGNOSIS — I5043 Acute on chronic combined systolic (congestive) and diastolic (congestive) heart failure: Secondary | ICD-10-CM | POA: Diagnosis not present

## 2018-01-09 DIAGNOSIS — N289 Disorder of kidney and ureter, unspecified: Secondary | ICD-10-CM | POA: Diagnosis not present

## 2018-01-09 DIAGNOSIS — I481 Persistent atrial fibrillation: Secondary | ICD-10-CM | POA: Diagnosis not present

## 2018-01-09 DIAGNOSIS — E1042 Type 1 diabetes mellitus with diabetic polyneuropathy: Secondary | ICD-10-CM | POA: Diagnosis not present

## 2018-01-09 DIAGNOSIS — M5441 Lumbago with sciatica, right side: Secondary | ICD-10-CM | POA: Diagnosis not present

## 2018-01-12 ENCOUNTER — Encounter (HOSPITAL_BASED_OUTPATIENT_CLINIC_OR_DEPARTMENT_OTHER): Payer: Medicare Other

## 2018-01-12 DIAGNOSIS — I110 Hypertensive heart disease with heart failure: Secondary | ICD-10-CM | POA: Diagnosis not present

## 2018-01-12 DIAGNOSIS — G8929 Other chronic pain: Secondary | ICD-10-CM | POA: Diagnosis not present

## 2018-01-12 DIAGNOSIS — J9621 Acute and chronic respiratory failure with hypoxia: Secondary | ICD-10-CM | POA: Diagnosis not present

## 2018-01-12 DIAGNOSIS — N289 Disorder of kidney and ureter, unspecified: Secondary | ICD-10-CM | POA: Diagnosis not present

## 2018-01-12 DIAGNOSIS — I5043 Acute on chronic combined systolic (congestive) and diastolic (congestive) heart failure: Secondary | ICD-10-CM | POA: Diagnosis not present

## 2018-01-12 DIAGNOSIS — E1042 Type 1 diabetes mellitus with diabetic polyneuropathy: Secondary | ICD-10-CM | POA: Diagnosis not present

## 2018-01-12 DIAGNOSIS — M5441 Lumbago with sciatica, right side: Secondary | ICD-10-CM | POA: Diagnosis not present

## 2018-01-12 DIAGNOSIS — G4733 Obstructive sleep apnea (adult) (pediatric): Secondary | ICD-10-CM | POA: Diagnosis not present

## 2018-01-12 DIAGNOSIS — M5442 Lumbago with sciatica, left side: Secondary | ICD-10-CM | POA: Diagnosis not present

## 2018-01-12 DIAGNOSIS — K469 Unspecified abdominal hernia without obstruction or gangrene: Secondary | ICD-10-CM | POA: Diagnosis not present

## 2018-01-12 DIAGNOSIS — I481 Persistent atrial fibrillation: Secondary | ICD-10-CM | POA: Diagnosis not present

## 2018-01-13 DIAGNOSIS — G8929 Other chronic pain: Secondary | ICD-10-CM | POA: Diagnosis not present

## 2018-01-13 DIAGNOSIS — E1042 Type 1 diabetes mellitus with diabetic polyneuropathy: Secondary | ICD-10-CM | POA: Diagnosis not present

## 2018-01-13 DIAGNOSIS — I5043 Acute on chronic combined systolic (congestive) and diastolic (congestive) heart failure: Secondary | ICD-10-CM | POA: Diagnosis not present

## 2018-01-13 DIAGNOSIS — G4733 Obstructive sleep apnea (adult) (pediatric): Secondary | ICD-10-CM | POA: Diagnosis not present

## 2018-01-13 DIAGNOSIS — I110 Hypertensive heart disease with heart failure: Secondary | ICD-10-CM | POA: Diagnosis not present

## 2018-01-13 DIAGNOSIS — M5441 Lumbago with sciatica, right side: Secondary | ICD-10-CM | POA: Diagnosis not present

## 2018-01-13 DIAGNOSIS — N289 Disorder of kidney and ureter, unspecified: Secondary | ICD-10-CM | POA: Diagnosis not present

## 2018-01-13 DIAGNOSIS — K469 Unspecified abdominal hernia without obstruction or gangrene: Secondary | ICD-10-CM | POA: Diagnosis not present

## 2018-01-13 DIAGNOSIS — I481 Persistent atrial fibrillation: Secondary | ICD-10-CM | POA: Diagnosis not present

## 2018-01-13 DIAGNOSIS — J9621 Acute and chronic respiratory failure with hypoxia: Secondary | ICD-10-CM | POA: Diagnosis not present

## 2018-01-13 DIAGNOSIS — M5442 Lumbago with sciatica, left side: Secondary | ICD-10-CM | POA: Diagnosis not present

## 2018-01-14 DIAGNOSIS — G4733 Obstructive sleep apnea (adult) (pediatric): Secondary | ICD-10-CM | POA: Diagnosis not present

## 2018-01-14 DIAGNOSIS — I110 Hypertensive heart disease with heart failure: Secondary | ICD-10-CM | POA: Diagnosis not present

## 2018-01-14 DIAGNOSIS — M5442 Lumbago with sciatica, left side: Secondary | ICD-10-CM | POA: Diagnosis not present

## 2018-01-14 DIAGNOSIS — N289 Disorder of kidney and ureter, unspecified: Secondary | ICD-10-CM | POA: Diagnosis not present

## 2018-01-14 DIAGNOSIS — I5043 Acute on chronic combined systolic (congestive) and diastolic (congestive) heart failure: Secondary | ICD-10-CM | POA: Diagnosis not present

## 2018-01-14 DIAGNOSIS — M5441 Lumbago with sciatica, right side: Secondary | ICD-10-CM | POA: Diagnosis not present

## 2018-01-14 DIAGNOSIS — J9621 Acute and chronic respiratory failure with hypoxia: Secondary | ICD-10-CM | POA: Diagnosis not present

## 2018-01-14 DIAGNOSIS — E1042 Type 1 diabetes mellitus with diabetic polyneuropathy: Secondary | ICD-10-CM | POA: Diagnosis not present

## 2018-01-14 DIAGNOSIS — G8929 Other chronic pain: Secondary | ICD-10-CM | POA: Diagnosis not present

## 2018-01-14 DIAGNOSIS — I481 Persistent atrial fibrillation: Secondary | ICD-10-CM | POA: Diagnosis not present

## 2018-01-14 DIAGNOSIS — K469 Unspecified abdominal hernia without obstruction or gangrene: Secondary | ICD-10-CM | POA: Diagnosis not present

## 2018-01-15 DIAGNOSIS — J9621 Acute and chronic respiratory failure with hypoxia: Secondary | ICD-10-CM | POA: Diagnosis not present

## 2018-01-15 DIAGNOSIS — I110 Hypertensive heart disease with heart failure: Secondary | ICD-10-CM | POA: Diagnosis not present

## 2018-01-15 DIAGNOSIS — G8929 Other chronic pain: Secondary | ICD-10-CM | POA: Diagnosis not present

## 2018-01-15 DIAGNOSIS — E1042 Type 1 diabetes mellitus with diabetic polyneuropathy: Secondary | ICD-10-CM | POA: Diagnosis not present

## 2018-01-15 DIAGNOSIS — I5043 Acute on chronic combined systolic (congestive) and diastolic (congestive) heart failure: Secondary | ICD-10-CM | POA: Diagnosis not present

## 2018-01-15 DIAGNOSIS — I481 Persistent atrial fibrillation: Secondary | ICD-10-CM | POA: Diagnosis not present

## 2018-01-15 DIAGNOSIS — M5441 Lumbago with sciatica, right side: Secondary | ICD-10-CM | POA: Diagnosis not present

## 2018-01-15 DIAGNOSIS — G4733 Obstructive sleep apnea (adult) (pediatric): Secondary | ICD-10-CM | POA: Diagnosis not present

## 2018-01-15 DIAGNOSIS — M5442 Lumbago with sciatica, left side: Secondary | ICD-10-CM | POA: Diagnosis not present

## 2018-01-15 DIAGNOSIS — N289 Disorder of kidney and ureter, unspecified: Secondary | ICD-10-CM | POA: Diagnosis not present

## 2018-01-15 DIAGNOSIS — K469 Unspecified abdominal hernia without obstruction or gangrene: Secondary | ICD-10-CM | POA: Diagnosis not present

## 2018-01-16 DIAGNOSIS — M5442 Lumbago with sciatica, left side: Secondary | ICD-10-CM | POA: Diagnosis not present

## 2018-01-16 DIAGNOSIS — J9621 Acute and chronic respiratory failure with hypoxia: Secondary | ICD-10-CM | POA: Diagnosis not present

## 2018-01-16 DIAGNOSIS — I110 Hypertensive heart disease with heart failure: Secondary | ICD-10-CM | POA: Diagnosis not present

## 2018-01-16 DIAGNOSIS — G4733 Obstructive sleep apnea (adult) (pediatric): Secondary | ICD-10-CM | POA: Diagnosis not present

## 2018-01-16 DIAGNOSIS — E1042 Type 1 diabetes mellitus with diabetic polyneuropathy: Secondary | ICD-10-CM | POA: Diagnosis not present

## 2018-01-16 DIAGNOSIS — G8929 Other chronic pain: Secondary | ICD-10-CM | POA: Diagnosis not present

## 2018-01-16 DIAGNOSIS — I5043 Acute on chronic combined systolic (congestive) and diastolic (congestive) heart failure: Secondary | ICD-10-CM | POA: Diagnosis not present

## 2018-01-16 DIAGNOSIS — K469 Unspecified abdominal hernia without obstruction or gangrene: Secondary | ICD-10-CM | POA: Diagnosis not present

## 2018-01-16 DIAGNOSIS — I481 Persistent atrial fibrillation: Secondary | ICD-10-CM | POA: Diagnosis not present

## 2018-01-16 DIAGNOSIS — N289 Disorder of kidney and ureter, unspecified: Secondary | ICD-10-CM | POA: Diagnosis not present

## 2018-01-16 DIAGNOSIS — M5441 Lumbago with sciatica, right side: Secondary | ICD-10-CM | POA: Diagnosis not present

## 2018-01-19 DIAGNOSIS — M5442 Lumbago with sciatica, left side: Secondary | ICD-10-CM | POA: Diagnosis not present

## 2018-01-19 DIAGNOSIS — E1042 Type 1 diabetes mellitus with diabetic polyneuropathy: Secondary | ICD-10-CM | POA: Diagnosis not present

## 2018-01-19 DIAGNOSIS — I481 Persistent atrial fibrillation: Secondary | ICD-10-CM | POA: Diagnosis not present

## 2018-01-19 DIAGNOSIS — M5441 Lumbago with sciatica, right side: Secondary | ICD-10-CM | POA: Diagnosis not present

## 2018-01-19 DIAGNOSIS — I110 Hypertensive heart disease with heart failure: Secondary | ICD-10-CM | POA: Diagnosis not present

## 2018-01-19 DIAGNOSIS — K469 Unspecified abdominal hernia without obstruction or gangrene: Secondary | ICD-10-CM | POA: Diagnosis not present

## 2018-01-19 DIAGNOSIS — G4733 Obstructive sleep apnea (adult) (pediatric): Secondary | ICD-10-CM | POA: Diagnosis not present

## 2018-01-19 DIAGNOSIS — J9621 Acute and chronic respiratory failure with hypoxia: Secondary | ICD-10-CM | POA: Diagnosis not present

## 2018-01-19 DIAGNOSIS — G8929 Other chronic pain: Secondary | ICD-10-CM | POA: Diagnosis not present

## 2018-01-19 DIAGNOSIS — I5043 Acute on chronic combined systolic (congestive) and diastolic (congestive) heart failure: Secondary | ICD-10-CM | POA: Diagnosis not present

## 2018-01-19 DIAGNOSIS — N289 Disorder of kidney and ureter, unspecified: Secondary | ICD-10-CM | POA: Diagnosis not present

## 2018-01-20 DIAGNOSIS — I481 Persistent atrial fibrillation: Secondary | ICD-10-CM | POA: Diagnosis not present

## 2018-01-20 DIAGNOSIS — E1042 Type 1 diabetes mellitus with diabetic polyneuropathy: Secondary | ICD-10-CM | POA: Diagnosis not present

## 2018-01-20 DIAGNOSIS — G4733 Obstructive sleep apnea (adult) (pediatric): Secondary | ICD-10-CM | POA: Diagnosis not present

## 2018-01-20 DIAGNOSIS — G8929 Other chronic pain: Secondary | ICD-10-CM | POA: Diagnosis not present

## 2018-01-20 DIAGNOSIS — J9621 Acute and chronic respiratory failure with hypoxia: Secondary | ICD-10-CM | POA: Diagnosis not present

## 2018-01-20 DIAGNOSIS — M5441 Lumbago with sciatica, right side: Secondary | ICD-10-CM | POA: Diagnosis not present

## 2018-01-20 DIAGNOSIS — I110 Hypertensive heart disease with heart failure: Secondary | ICD-10-CM | POA: Diagnosis not present

## 2018-01-20 DIAGNOSIS — M5442 Lumbago with sciatica, left side: Secondary | ICD-10-CM | POA: Diagnosis not present

## 2018-01-20 DIAGNOSIS — K469 Unspecified abdominal hernia without obstruction or gangrene: Secondary | ICD-10-CM | POA: Diagnosis not present

## 2018-01-20 DIAGNOSIS — I5043 Acute on chronic combined systolic (congestive) and diastolic (congestive) heart failure: Secondary | ICD-10-CM | POA: Diagnosis not present

## 2018-01-20 DIAGNOSIS — N289 Disorder of kidney and ureter, unspecified: Secondary | ICD-10-CM | POA: Diagnosis not present

## 2018-01-21 DIAGNOSIS — M5442 Lumbago with sciatica, left side: Secondary | ICD-10-CM | POA: Diagnosis not present

## 2018-01-21 DIAGNOSIS — M5441 Lumbago with sciatica, right side: Secondary | ICD-10-CM | POA: Diagnosis not present

## 2018-01-21 DIAGNOSIS — I110 Hypertensive heart disease with heart failure: Secondary | ICD-10-CM | POA: Diagnosis not present

## 2018-01-21 DIAGNOSIS — N289 Disorder of kidney and ureter, unspecified: Secondary | ICD-10-CM | POA: Diagnosis not present

## 2018-01-21 DIAGNOSIS — I5043 Acute on chronic combined systolic (congestive) and diastolic (congestive) heart failure: Secondary | ICD-10-CM | POA: Diagnosis not present

## 2018-01-21 DIAGNOSIS — K469 Unspecified abdominal hernia without obstruction or gangrene: Secondary | ICD-10-CM | POA: Diagnosis not present

## 2018-01-21 DIAGNOSIS — G8929 Other chronic pain: Secondary | ICD-10-CM | POA: Diagnosis not present

## 2018-01-21 DIAGNOSIS — I481 Persistent atrial fibrillation: Secondary | ICD-10-CM | POA: Diagnosis not present

## 2018-01-21 DIAGNOSIS — E1042 Type 1 diabetes mellitus with diabetic polyneuropathy: Secondary | ICD-10-CM | POA: Diagnosis not present

## 2018-01-21 DIAGNOSIS — J9621 Acute and chronic respiratory failure with hypoxia: Secondary | ICD-10-CM | POA: Diagnosis not present

## 2018-01-21 DIAGNOSIS — G4733 Obstructive sleep apnea (adult) (pediatric): Secondary | ICD-10-CM | POA: Diagnosis not present

## 2018-01-22 DIAGNOSIS — M5442 Lumbago with sciatica, left side: Secondary | ICD-10-CM | POA: Diagnosis not present

## 2018-01-22 DIAGNOSIS — K469 Unspecified abdominal hernia without obstruction or gangrene: Secondary | ICD-10-CM | POA: Diagnosis not present

## 2018-01-22 DIAGNOSIS — G4733 Obstructive sleep apnea (adult) (pediatric): Secondary | ICD-10-CM | POA: Diagnosis not present

## 2018-01-22 DIAGNOSIS — N289 Disorder of kidney and ureter, unspecified: Secondary | ICD-10-CM | POA: Diagnosis not present

## 2018-01-22 DIAGNOSIS — J9621 Acute and chronic respiratory failure with hypoxia: Secondary | ICD-10-CM | POA: Diagnosis not present

## 2018-01-22 DIAGNOSIS — M5441 Lumbago with sciatica, right side: Secondary | ICD-10-CM | POA: Diagnosis not present

## 2018-01-22 DIAGNOSIS — G8929 Other chronic pain: Secondary | ICD-10-CM | POA: Diagnosis not present

## 2018-01-22 DIAGNOSIS — I481 Persistent atrial fibrillation: Secondary | ICD-10-CM | POA: Diagnosis not present

## 2018-01-22 DIAGNOSIS — I110 Hypertensive heart disease with heart failure: Secondary | ICD-10-CM | POA: Diagnosis not present

## 2018-01-22 DIAGNOSIS — I5043 Acute on chronic combined systolic (congestive) and diastolic (congestive) heart failure: Secondary | ICD-10-CM | POA: Diagnosis not present

## 2018-01-22 DIAGNOSIS — E1042 Type 1 diabetes mellitus with diabetic polyneuropathy: Secondary | ICD-10-CM | POA: Diagnosis not present

## 2018-01-23 DIAGNOSIS — I5043 Acute on chronic combined systolic (congestive) and diastolic (congestive) heart failure: Secondary | ICD-10-CM | POA: Diagnosis not present

## 2018-01-23 DIAGNOSIS — E1042 Type 1 diabetes mellitus with diabetic polyneuropathy: Secondary | ICD-10-CM | POA: Diagnosis not present

## 2018-01-23 DIAGNOSIS — N289 Disorder of kidney and ureter, unspecified: Secondary | ICD-10-CM | POA: Diagnosis not present

## 2018-01-23 DIAGNOSIS — M5441 Lumbago with sciatica, right side: Secondary | ICD-10-CM | POA: Diagnosis not present

## 2018-01-23 DIAGNOSIS — K469 Unspecified abdominal hernia without obstruction or gangrene: Secondary | ICD-10-CM | POA: Diagnosis not present

## 2018-01-23 DIAGNOSIS — G8929 Other chronic pain: Secondary | ICD-10-CM | POA: Diagnosis not present

## 2018-01-23 DIAGNOSIS — G4733 Obstructive sleep apnea (adult) (pediatric): Secondary | ICD-10-CM | POA: Diagnosis not present

## 2018-01-23 DIAGNOSIS — I110 Hypertensive heart disease with heart failure: Secondary | ICD-10-CM | POA: Diagnosis not present

## 2018-01-23 DIAGNOSIS — M5442 Lumbago with sciatica, left side: Secondary | ICD-10-CM | POA: Diagnosis not present

## 2018-01-23 DIAGNOSIS — I481 Persistent atrial fibrillation: Secondary | ICD-10-CM | POA: Diagnosis not present

## 2018-01-23 DIAGNOSIS — J9621 Acute and chronic respiratory failure with hypoxia: Secondary | ICD-10-CM | POA: Diagnosis not present

## 2018-01-27 DIAGNOSIS — I481 Persistent atrial fibrillation: Secondary | ICD-10-CM | POA: Diagnosis not present

## 2018-01-27 DIAGNOSIS — G4733 Obstructive sleep apnea (adult) (pediatric): Secondary | ICD-10-CM | POA: Diagnosis not present

## 2018-01-27 DIAGNOSIS — I5043 Acute on chronic combined systolic (congestive) and diastolic (congestive) heart failure: Secondary | ICD-10-CM | POA: Diagnosis not present

## 2018-01-27 DIAGNOSIS — M5442 Lumbago with sciatica, left side: Secondary | ICD-10-CM | POA: Diagnosis not present

## 2018-01-27 DIAGNOSIS — E1042 Type 1 diabetes mellitus with diabetic polyneuropathy: Secondary | ICD-10-CM | POA: Diagnosis not present

## 2018-01-27 DIAGNOSIS — J9621 Acute and chronic respiratory failure with hypoxia: Secondary | ICD-10-CM | POA: Diagnosis not present

## 2018-01-27 DIAGNOSIS — I110 Hypertensive heart disease with heart failure: Secondary | ICD-10-CM | POA: Diagnosis not present

## 2018-01-27 DIAGNOSIS — G8929 Other chronic pain: Secondary | ICD-10-CM | POA: Diagnosis not present

## 2018-01-27 DIAGNOSIS — K469 Unspecified abdominal hernia without obstruction or gangrene: Secondary | ICD-10-CM | POA: Diagnosis not present

## 2018-01-27 DIAGNOSIS — M5441 Lumbago with sciatica, right side: Secondary | ICD-10-CM | POA: Diagnosis not present

## 2018-01-27 DIAGNOSIS — N289 Disorder of kidney and ureter, unspecified: Secondary | ICD-10-CM | POA: Diagnosis not present

## 2018-01-28 DIAGNOSIS — N289 Disorder of kidney and ureter, unspecified: Secondary | ICD-10-CM | POA: Diagnosis not present

## 2018-01-28 DIAGNOSIS — I481 Persistent atrial fibrillation: Secondary | ICD-10-CM | POA: Diagnosis not present

## 2018-01-28 DIAGNOSIS — M5441 Lumbago with sciatica, right side: Secondary | ICD-10-CM | POA: Diagnosis not present

## 2018-01-28 DIAGNOSIS — G4733 Obstructive sleep apnea (adult) (pediatric): Secondary | ICD-10-CM | POA: Diagnosis not present

## 2018-01-28 DIAGNOSIS — E1042 Type 1 diabetes mellitus with diabetic polyneuropathy: Secondary | ICD-10-CM | POA: Diagnosis not present

## 2018-01-28 DIAGNOSIS — G8929 Other chronic pain: Secondary | ICD-10-CM | POA: Diagnosis not present

## 2018-01-28 DIAGNOSIS — M5442 Lumbago with sciatica, left side: Secondary | ICD-10-CM | POA: Diagnosis not present

## 2018-01-28 DIAGNOSIS — I110 Hypertensive heart disease with heart failure: Secondary | ICD-10-CM | POA: Diagnosis not present

## 2018-01-28 DIAGNOSIS — J9621 Acute and chronic respiratory failure with hypoxia: Secondary | ICD-10-CM | POA: Diagnosis not present

## 2018-01-28 DIAGNOSIS — K469 Unspecified abdominal hernia without obstruction or gangrene: Secondary | ICD-10-CM | POA: Diagnosis not present

## 2018-01-28 DIAGNOSIS — I5043 Acute on chronic combined systolic (congestive) and diastolic (congestive) heart failure: Secondary | ICD-10-CM | POA: Diagnosis not present

## 2018-01-29 DIAGNOSIS — I110 Hypertensive heart disease with heart failure: Secondary | ICD-10-CM | POA: Diagnosis not present

## 2018-01-29 DIAGNOSIS — K469 Unspecified abdominal hernia without obstruction or gangrene: Secondary | ICD-10-CM | POA: Diagnosis not present

## 2018-01-29 DIAGNOSIS — I5043 Acute on chronic combined systolic (congestive) and diastolic (congestive) heart failure: Secondary | ICD-10-CM | POA: Diagnosis not present

## 2018-01-29 DIAGNOSIS — I481 Persistent atrial fibrillation: Secondary | ICD-10-CM | POA: Diagnosis not present

## 2018-01-29 DIAGNOSIS — G8929 Other chronic pain: Secondary | ICD-10-CM | POA: Diagnosis not present

## 2018-01-29 DIAGNOSIS — N289 Disorder of kidney and ureter, unspecified: Secondary | ICD-10-CM | POA: Diagnosis not present

## 2018-01-29 DIAGNOSIS — J9621 Acute and chronic respiratory failure with hypoxia: Secondary | ICD-10-CM | POA: Diagnosis not present

## 2018-01-29 DIAGNOSIS — M5442 Lumbago with sciatica, left side: Secondary | ICD-10-CM | POA: Diagnosis not present

## 2018-01-29 DIAGNOSIS — G4733 Obstructive sleep apnea (adult) (pediatric): Secondary | ICD-10-CM | POA: Diagnosis not present

## 2018-01-29 DIAGNOSIS — M5441 Lumbago with sciatica, right side: Secondary | ICD-10-CM | POA: Diagnosis not present

## 2018-01-29 DIAGNOSIS — E1042 Type 1 diabetes mellitus with diabetic polyneuropathy: Secondary | ICD-10-CM | POA: Diagnosis not present

## 2018-01-30 DIAGNOSIS — I5043 Acute on chronic combined systolic (congestive) and diastolic (congestive) heart failure: Secondary | ICD-10-CM | POA: Diagnosis not present

## 2018-01-30 DIAGNOSIS — J9621 Acute and chronic respiratory failure with hypoxia: Secondary | ICD-10-CM | POA: Diagnosis not present

## 2018-01-30 DIAGNOSIS — K469 Unspecified abdominal hernia without obstruction or gangrene: Secondary | ICD-10-CM | POA: Diagnosis not present

## 2018-01-30 DIAGNOSIS — N289 Disorder of kidney and ureter, unspecified: Secondary | ICD-10-CM | POA: Diagnosis not present

## 2018-01-30 DIAGNOSIS — I110 Hypertensive heart disease with heart failure: Secondary | ICD-10-CM | POA: Diagnosis not present

## 2018-01-30 DIAGNOSIS — I481 Persistent atrial fibrillation: Secondary | ICD-10-CM | POA: Diagnosis not present

## 2018-01-30 DIAGNOSIS — M5442 Lumbago with sciatica, left side: Secondary | ICD-10-CM | POA: Diagnosis not present

## 2018-01-30 DIAGNOSIS — E1042 Type 1 diabetes mellitus with diabetic polyneuropathy: Secondary | ICD-10-CM | POA: Diagnosis not present

## 2018-01-30 DIAGNOSIS — G4733 Obstructive sleep apnea (adult) (pediatric): Secondary | ICD-10-CM | POA: Diagnosis not present

## 2018-01-30 DIAGNOSIS — M5441 Lumbago with sciatica, right side: Secondary | ICD-10-CM | POA: Diagnosis not present

## 2018-01-30 DIAGNOSIS — G8929 Other chronic pain: Secondary | ICD-10-CM | POA: Diagnosis not present

## 2018-02-02 DIAGNOSIS — I110 Hypertensive heart disease with heart failure: Secondary | ICD-10-CM | POA: Diagnosis not present

## 2018-02-02 DIAGNOSIS — N289 Disorder of kidney and ureter, unspecified: Secondary | ICD-10-CM | POA: Diagnosis not present

## 2018-02-02 DIAGNOSIS — G4733 Obstructive sleep apnea (adult) (pediatric): Secondary | ICD-10-CM | POA: Diagnosis not present

## 2018-02-02 DIAGNOSIS — G8929 Other chronic pain: Secondary | ICD-10-CM | POA: Diagnosis not present

## 2018-02-02 DIAGNOSIS — J9621 Acute and chronic respiratory failure with hypoxia: Secondary | ICD-10-CM | POA: Diagnosis not present

## 2018-02-02 DIAGNOSIS — E1042 Type 1 diabetes mellitus with diabetic polyneuropathy: Secondary | ICD-10-CM | POA: Diagnosis not present

## 2018-02-02 DIAGNOSIS — M5442 Lumbago with sciatica, left side: Secondary | ICD-10-CM | POA: Diagnosis not present

## 2018-02-02 DIAGNOSIS — M5441 Lumbago with sciatica, right side: Secondary | ICD-10-CM | POA: Diagnosis not present

## 2018-02-02 DIAGNOSIS — I481 Persistent atrial fibrillation: Secondary | ICD-10-CM | POA: Diagnosis not present

## 2018-02-02 DIAGNOSIS — K469 Unspecified abdominal hernia without obstruction or gangrene: Secondary | ICD-10-CM | POA: Diagnosis not present

## 2018-02-02 DIAGNOSIS — I5043 Acute on chronic combined systolic (congestive) and diastolic (congestive) heart failure: Secondary | ICD-10-CM | POA: Diagnosis not present

## 2018-02-03 DIAGNOSIS — I5043 Acute on chronic combined systolic (congestive) and diastolic (congestive) heart failure: Secondary | ICD-10-CM | POA: Diagnosis not present

## 2018-02-03 DIAGNOSIS — M5442 Lumbago with sciatica, left side: Secondary | ICD-10-CM | POA: Diagnosis not present

## 2018-02-03 DIAGNOSIS — G4733 Obstructive sleep apnea (adult) (pediatric): Secondary | ICD-10-CM | POA: Diagnosis not present

## 2018-02-03 DIAGNOSIS — I481 Persistent atrial fibrillation: Secondary | ICD-10-CM | POA: Diagnosis not present

## 2018-02-03 DIAGNOSIS — K469 Unspecified abdominal hernia without obstruction or gangrene: Secondary | ICD-10-CM | POA: Diagnosis not present

## 2018-02-03 DIAGNOSIS — M5441 Lumbago with sciatica, right side: Secondary | ICD-10-CM | POA: Diagnosis not present

## 2018-02-03 DIAGNOSIS — E1042 Type 1 diabetes mellitus with diabetic polyneuropathy: Secondary | ICD-10-CM | POA: Diagnosis not present

## 2018-02-03 DIAGNOSIS — J9621 Acute and chronic respiratory failure with hypoxia: Secondary | ICD-10-CM | POA: Diagnosis not present

## 2018-02-03 DIAGNOSIS — I110 Hypertensive heart disease with heart failure: Secondary | ICD-10-CM | POA: Diagnosis not present

## 2018-02-03 DIAGNOSIS — N289 Disorder of kidney and ureter, unspecified: Secondary | ICD-10-CM | POA: Diagnosis not present

## 2018-02-03 DIAGNOSIS — G8929 Other chronic pain: Secondary | ICD-10-CM | POA: Diagnosis not present

## 2018-02-04 DIAGNOSIS — N289 Disorder of kidney and ureter, unspecified: Secondary | ICD-10-CM | POA: Diagnosis not present

## 2018-02-04 DIAGNOSIS — J9621 Acute and chronic respiratory failure with hypoxia: Secondary | ICD-10-CM | POA: Diagnosis not present

## 2018-02-04 DIAGNOSIS — M5442 Lumbago with sciatica, left side: Secondary | ICD-10-CM | POA: Diagnosis not present

## 2018-02-04 DIAGNOSIS — M5441 Lumbago with sciatica, right side: Secondary | ICD-10-CM | POA: Diagnosis not present

## 2018-02-04 DIAGNOSIS — I5043 Acute on chronic combined systolic (congestive) and diastolic (congestive) heart failure: Secondary | ICD-10-CM | POA: Diagnosis not present

## 2018-02-04 DIAGNOSIS — I481 Persistent atrial fibrillation: Secondary | ICD-10-CM | POA: Diagnosis not present

## 2018-02-04 DIAGNOSIS — I110 Hypertensive heart disease with heart failure: Secondary | ICD-10-CM | POA: Diagnosis not present

## 2018-02-04 DIAGNOSIS — G8929 Other chronic pain: Secondary | ICD-10-CM | POA: Diagnosis not present

## 2018-02-04 DIAGNOSIS — K469 Unspecified abdominal hernia without obstruction or gangrene: Secondary | ICD-10-CM | POA: Diagnosis not present

## 2018-02-04 DIAGNOSIS — E1042 Type 1 diabetes mellitus with diabetic polyneuropathy: Secondary | ICD-10-CM | POA: Diagnosis not present

## 2018-02-04 DIAGNOSIS — G4733 Obstructive sleep apnea (adult) (pediatric): Secondary | ICD-10-CM | POA: Diagnosis not present

## 2018-02-05 DIAGNOSIS — J9621 Acute and chronic respiratory failure with hypoxia: Secondary | ICD-10-CM | POA: Diagnosis not present

## 2018-02-05 DIAGNOSIS — I110 Hypertensive heart disease with heart failure: Secondary | ICD-10-CM | POA: Diagnosis not present

## 2018-02-05 DIAGNOSIS — M5441 Lumbago with sciatica, right side: Secondary | ICD-10-CM | POA: Diagnosis not present

## 2018-02-05 DIAGNOSIS — K469 Unspecified abdominal hernia without obstruction or gangrene: Secondary | ICD-10-CM | POA: Diagnosis not present

## 2018-02-05 DIAGNOSIS — M5442 Lumbago with sciatica, left side: Secondary | ICD-10-CM | POA: Diagnosis not present

## 2018-02-05 DIAGNOSIS — N289 Disorder of kidney and ureter, unspecified: Secondary | ICD-10-CM | POA: Diagnosis not present

## 2018-02-05 DIAGNOSIS — G8929 Other chronic pain: Secondary | ICD-10-CM | POA: Diagnosis not present

## 2018-02-05 DIAGNOSIS — G4733 Obstructive sleep apnea (adult) (pediatric): Secondary | ICD-10-CM | POA: Diagnosis not present

## 2018-02-05 DIAGNOSIS — I5043 Acute on chronic combined systolic (congestive) and diastolic (congestive) heart failure: Secondary | ICD-10-CM | POA: Diagnosis not present

## 2018-02-05 DIAGNOSIS — E1042 Type 1 diabetes mellitus with diabetic polyneuropathy: Secondary | ICD-10-CM | POA: Diagnosis not present

## 2018-02-05 DIAGNOSIS — I481 Persistent atrial fibrillation: Secondary | ICD-10-CM | POA: Diagnosis not present

## 2018-02-06 DIAGNOSIS — I110 Hypertensive heart disease with heart failure: Secondary | ICD-10-CM | POA: Diagnosis not present

## 2018-02-06 DIAGNOSIS — M5441 Lumbago with sciatica, right side: Secondary | ICD-10-CM | POA: Diagnosis not present

## 2018-02-06 DIAGNOSIS — K469 Unspecified abdominal hernia without obstruction or gangrene: Secondary | ICD-10-CM | POA: Diagnosis not present

## 2018-02-06 DIAGNOSIS — J9621 Acute and chronic respiratory failure with hypoxia: Secondary | ICD-10-CM | POA: Diagnosis not present

## 2018-02-06 DIAGNOSIS — I5043 Acute on chronic combined systolic (congestive) and diastolic (congestive) heart failure: Secondary | ICD-10-CM | POA: Diagnosis not present

## 2018-02-06 DIAGNOSIS — E1042 Type 1 diabetes mellitus with diabetic polyneuropathy: Secondary | ICD-10-CM | POA: Diagnosis not present

## 2018-02-06 DIAGNOSIS — G4733 Obstructive sleep apnea (adult) (pediatric): Secondary | ICD-10-CM | POA: Diagnosis not present

## 2018-02-06 DIAGNOSIS — N289 Disorder of kidney and ureter, unspecified: Secondary | ICD-10-CM | POA: Diagnosis not present

## 2018-02-06 DIAGNOSIS — I481 Persistent atrial fibrillation: Secondary | ICD-10-CM | POA: Diagnosis not present

## 2018-02-06 DIAGNOSIS — G8929 Other chronic pain: Secondary | ICD-10-CM | POA: Diagnosis not present

## 2018-02-06 DIAGNOSIS — M5442 Lumbago with sciatica, left side: Secondary | ICD-10-CM | POA: Diagnosis not present

## 2018-02-09 DIAGNOSIS — K469 Unspecified abdominal hernia without obstruction or gangrene: Secondary | ICD-10-CM | POA: Diagnosis not present

## 2018-02-09 DIAGNOSIS — G4733 Obstructive sleep apnea (adult) (pediatric): Secondary | ICD-10-CM | POA: Diagnosis not present

## 2018-02-09 DIAGNOSIS — M5442 Lumbago with sciatica, left side: Secondary | ICD-10-CM | POA: Diagnosis not present

## 2018-02-09 DIAGNOSIS — E1042 Type 1 diabetes mellitus with diabetic polyneuropathy: Secondary | ICD-10-CM | POA: Diagnosis not present

## 2018-02-09 DIAGNOSIS — N289 Disorder of kidney and ureter, unspecified: Secondary | ICD-10-CM | POA: Diagnosis not present

## 2018-02-09 DIAGNOSIS — I5043 Acute on chronic combined systolic (congestive) and diastolic (congestive) heart failure: Secondary | ICD-10-CM | POA: Diagnosis not present

## 2018-02-09 DIAGNOSIS — M5441 Lumbago with sciatica, right side: Secondary | ICD-10-CM | POA: Diagnosis not present

## 2018-02-09 DIAGNOSIS — J9621 Acute and chronic respiratory failure with hypoxia: Secondary | ICD-10-CM | POA: Diagnosis not present

## 2018-02-09 DIAGNOSIS — I481 Persistent atrial fibrillation: Secondary | ICD-10-CM | POA: Diagnosis not present

## 2018-02-09 DIAGNOSIS — G8929 Other chronic pain: Secondary | ICD-10-CM | POA: Diagnosis not present

## 2018-02-09 DIAGNOSIS — I110 Hypertensive heart disease with heart failure: Secondary | ICD-10-CM | POA: Diagnosis not present

## 2018-02-11 ENCOUNTER — Ambulatory Visit (HOSPITAL_COMMUNITY)
Admission: RE | Admit: 2018-02-11 | Discharge: 2018-02-11 | Disposition: A | Discharge status: Home / Self Care | Payer: Medicare Other | Source: Ambulatory Visit | Attending: Physician Assistant | Admitting: Physician Assistant

## 2018-02-11 ENCOUNTER — Encounter (HOSPITAL_BASED_OUTPATIENT_CLINIC_OR_DEPARTMENT_OTHER): Payer: Medicare Other | Attending: Internal Medicine

## 2018-02-11 ENCOUNTER — Other Ambulatory Visit (HOSPITAL_COMMUNITY)
Admission: RE | Admit: 2018-02-11 | Discharge: 2018-02-11 | Disposition: A | Discharge status: Home / Self Care | Payer: Medicare Other | Source: Other Acute Inpatient Hospital | Attending: Physician Assistant | Admitting: Physician Assistant

## 2018-02-11 ENCOUNTER — Other Ambulatory Visit: Payer: Self-pay | Admitting: Physician Assistant

## 2018-02-11 DIAGNOSIS — E11628 Type 2 diabetes mellitus with other skin complications: Secondary | ICD-10-CM | POA: Diagnosis not present

## 2018-02-11 DIAGNOSIS — I481 Persistent atrial fibrillation: Secondary | ICD-10-CM | POA: Diagnosis not present

## 2018-02-11 DIAGNOSIS — I5043 Acute on chronic combined systolic (congestive) and diastolic (congestive) heart failure: Secondary | ICD-10-CM | POA: Diagnosis not present

## 2018-02-11 DIAGNOSIS — L89624 Pressure ulcer of left heel, stage 4: Secondary | ICD-10-CM

## 2018-02-11 DIAGNOSIS — K469 Unspecified abdominal hernia without obstruction or gangrene: Secondary | ICD-10-CM | POA: Diagnosis not present

## 2018-02-11 DIAGNOSIS — E872 Acidosis: Secondary | ICD-10-CM | POA: Diagnosis not present

## 2018-02-11 DIAGNOSIS — E11621 Type 2 diabetes mellitus with foot ulcer: Secondary | ICD-10-CM | POA: Diagnosis not present

## 2018-02-11 DIAGNOSIS — I130 Hypertensive heart and chronic kidney disease with heart failure and stage 1 through stage 4 chronic kidney disease, or unspecified chronic kidney disease: Secondary | ICD-10-CM | POA: Diagnosis not present

## 2018-02-11 DIAGNOSIS — G4730 Sleep apnea, unspecified: Secondary | ICD-10-CM | POA: Diagnosis not present

## 2018-02-11 DIAGNOSIS — R3129 Other microscopic hematuria: Secondary | ICD-10-CM | POA: Diagnosis not present

## 2018-02-11 DIAGNOSIS — E1142 Type 2 diabetes mellitus with diabetic polyneuropathy: Secondary | ICD-10-CM | POA: Diagnosis not present

## 2018-02-11 DIAGNOSIS — L89629 Pressure ulcer of left heel, unspecified stage: Secondary | ICD-10-CM | POA: Diagnosis not present

## 2018-02-11 DIAGNOSIS — E785 Hyperlipidemia, unspecified: Secondary | ICD-10-CM | POA: Diagnosis not present

## 2018-02-11 DIAGNOSIS — G8929 Other chronic pain: Secondary | ICD-10-CM | POA: Diagnosis not present

## 2018-02-11 DIAGNOSIS — E1140 Type 2 diabetes mellitus with diabetic neuropathy, unspecified: Secondary | ICD-10-CM | POA: Diagnosis not present

## 2018-02-11 DIAGNOSIS — L03116 Cellulitis of left lower limb: Secondary | ICD-10-CM | POA: Diagnosis not present

## 2018-02-11 DIAGNOSIS — E1122 Type 2 diabetes mellitus with diabetic chronic kidney disease: Secondary | ICD-10-CM | POA: Diagnosis not present

## 2018-02-11 DIAGNOSIS — N189 Chronic kidney disease, unspecified: Secondary | ICD-10-CM | POA: Diagnosis not present

## 2018-02-11 DIAGNOSIS — I110 Hypertensive heart disease with heart failure: Secondary | ICD-10-CM | POA: Diagnosis not present

## 2018-02-11 DIAGNOSIS — I2510 Atherosclerotic heart disease of native coronary artery without angina pectoris: Secondary | ICD-10-CM | POA: Diagnosis not present

## 2018-02-11 DIAGNOSIS — M2681 Anterior soft tissue impingement: Secondary | ICD-10-CM | POA: Diagnosis not present

## 2018-02-11 DIAGNOSIS — E876 Hypokalemia: Secondary | ICD-10-CM | POA: Diagnosis not present

## 2018-02-11 DIAGNOSIS — J9621 Acute and chronic respiratory failure with hypoxia: Secondary | ICD-10-CM | POA: Diagnosis not present

## 2018-02-11 DIAGNOSIS — E1042 Type 1 diabetes mellitus with diabetic polyneuropathy: Secondary | ICD-10-CM | POA: Diagnosis not present

## 2018-02-11 DIAGNOSIS — I4891 Unspecified atrial fibrillation: Secondary | ICD-10-CM | POA: Diagnosis not present

## 2018-02-11 DIAGNOSIS — E875 Hyperkalemia: Secondary | ICD-10-CM | POA: Diagnosis not present

## 2018-02-11 DIAGNOSIS — R6520 Severe sepsis without septic shock: Secondary | ICD-10-CM | POA: Diagnosis not present

## 2018-02-11 DIAGNOSIS — Z95810 Presence of automatic (implantable) cardiac defibrillator: Secondary | ICD-10-CM | POA: Insufficient documentation

## 2018-02-11 DIAGNOSIS — E1165 Type 2 diabetes mellitus with hyperglycemia: Secondary | ICD-10-CM | POA: Diagnosis not present

## 2018-02-11 DIAGNOSIS — L89313 Pressure ulcer of right buttock, stage 3: Secondary | ICD-10-CM | POA: Insufficient documentation

## 2018-02-11 DIAGNOSIS — I5042 Chronic combined systolic (congestive) and diastolic (congestive) heart failure: Secondary | ICD-10-CM | POA: Diagnosis not present

## 2018-02-11 DIAGNOSIS — L97429 Non-pressure chronic ulcer of left heel and midfoot with unspecified severity: Secondary | ICD-10-CM | POA: Diagnosis not present

## 2018-02-11 DIAGNOSIS — L89620 Pressure ulcer of left heel, unstageable: Secondary | ICD-10-CM | POA: Diagnosis not present

## 2018-02-11 DIAGNOSIS — Z87891 Personal history of nicotine dependence: Secondary | ICD-10-CM | POA: Insufficient documentation

## 2018-02-11 DIAGNOSIS — M5442 Lumbago with sciatica, left side: Secondary | ICD-10-CM | POA: Diagnosis not present

## 2018-02-11 DIAGNOSIS — N289 Disorder of kidney and ureter, unspecified: Secondary | ICD-10-CM | POA: Diagnosis not present

## 2018-02-11 DIAGNOSIS — Z794 Long term (current) use of insulin: Secondary | ICD-10-CM | POA: Diagnosis not present

## 2018-02-11 DIAGNOSIS — A4102 Sepsis due to Methicillin resistant Staphylococcus aureus: Secondary | ICD-10-CM | POA: Diagnosis not present

## 2018-02-11 DIAGNOSIS — N179 Acute kidney failure, unspecified: Secondary | ICD-10-CM | POA: Diagnosis not present

## 2018-02-11 DIAGNOSIS — F329 Major depressive disorder, single episode, unspecified: Secondary | ICD-10-CM | POA: Diagnosis not present

## 2018-02-11 DIAGNOSIS — I493 Ventricular premature depolarization: Secondary | ICD-10-CM | POA: Diagnosis not present

## 2018-02-11 DIAGNOSIS — M5441 Lumbago with sciatica, right side: Secondary | ICD-10-CM | POA: Diagnosis not present

## 2018-02-11 DIAGNOSIS — I4892 Unspecified atrial flutter: Secondary | ICD-10-CM | POA: Diagnosis not present

## 2018-02-11 DIAGNOSIS — G4733 Obstructive sleep apnea (adult) (pediatric): Secondary | ICD-10-CM | POA: Diagnosis not present

## 2018-02-11 DIAGNOSIS — L89153 Pressure ulcer of sacral region, stage 3: Secondary | ICD-10-CM | POA: Diagnosis not present

## 2018-02-12 DIAGNOSIS — K469 Unspecified abdominal hernia without obstruction or gangrene: Secondary | ICD-10-CM | POA: Diagnosis not present

## 2018-02-12 DIAGNOSIS — I481 Persistent atrial fibrillation: Secondary | ICD-10-CM | POA: Diagnosis not present

## 2018-02-12 DIAGNOSIS — I110 Hypertensive heart disease with heart failure: Secondary | ICD-10-CM | POA: Diagnosis not present

## 2018-02-12 DIAGNOSIS — J9621 Acute and chronic respiratory failure with hypoxia: Secondary | ICD-10-CM | POA: Diagnosis not present

## 2018-02-12 DIAGNOSIS — G8929 Other chronic pain: Secondary | ICD-10-CM | POA: Diagnosis not present

## 2018-02-12 DIAGNOSIS — N289 Disorder of kidney and ureter, unspecified: Secondary | ICD-10-CM | POA: Diagnosis not present

## 2018-02-12 DIAGNOSIS — I5043 Acute on chronic combined systolic (congestive) and diastolic (congestive) heart failure: Secondary | ICD-10-CM | POA: Diagnosis not present

## 2018-02-12 DIAGNOSIS — M5442 Lumbago with sciatica, left side: Secondary | ICD-10-CM | POA: Diagnosis not present

## 2018-02-12 DIAGNOSIS — G4733 Obstructive sleep apnea (adult) (pediatric): Secondary | ICD-10-CM | POA: Diagnosis not present

## 2018-02-12 DIAGNOSIS — E1042 Type 1 diabetes mellitus with diabetic polyneuropathy: Secondary | ICD-10-CM | POA: Diagnosis not present

## 2018-02-12 DIAGNOSIS — M5441 Lumbago with sciatica, right side: Secondary | ICD-10-CM | POA: Diagnosis not present

## 2018-02-13 ENCOUNTER — Inpatient Hospital Stay (HOSPITAL_COMMUNITY): Payer: Medicare Other

## 2018-02-13 ENCOUNTER — Inpatient Hospital Stay (HOSPITAL_COMMUNITY)
Admission: EM | Admit: 2018-02-13 | Discharge: 2018-02-18 | DRG: 871 | Disposition: A | Discharge status: Skilled Nursing Facility | Payer: Medicare Other | Attending: Internal Medicine | Admitting: Internal Medicine

## 2018-02-13 ENCOUNTER — Other Ambulatory Visit: Payer: Self-pay

## 2018-02-13 ENCOUNTER — Emergency Department (HOSPITAL_COMMUNITY): Payer: Medicare Other

## 2018-02-13 DIAGNOSIS — E785 Hyperlipidemia, unspecified: Secondary | ICD-10-CM | POA: Diagnosis present

## 2018-02-13 DIAGNOSIS — N179 Acute kidney failure, unspecified: Secondary | ICD-10-CM | POA: Diagnosis present

## 2018-02-13 DIAGNOSIS — I5042 Chronic combined systolic (congestive) and diastolic (congestive) heart failure: Secondary | ICD-10-CM | POA: Diagnosis not present

## 2018-02-13 DIAGNOSIS — R739 Hyperglycemia, unspecified: Secondary | ICD-10-CM

## 2018-02-13 DIAGNOSIS — R3129 Other microscopic hematuria: Secondary | ICD-10-CM | POA: Diagnosis not present

## 2018-02-13 DIAGNOSIS — L03116 Cellulitis of left lower limb: Secondary | ICD-10-CM | POA: Diagnosis not present

## 2018-02-13 DIAGNOSIS — Z950 Presence of cardiac pacemaker: Secondary | ICD-10-CM

## 2018-02-13 DIAGNOSIS — Z79891 Long term (current) use of opiate analgesic: Secondary | ICD-10-CM

## 2018-02-13 DIAGNOSIS — I482 Chronic atrial fibrillation: Secondary | ICD-10-CM | POA: Diagnosis not present

## 2018-02-13 DIAGNOSIS — I259 Chronic ischemic heart disease, unspecified: Secondary | ICD-10-CM | POA: Diagnosis present

## 2018-02-13 DIAGNOSIS — L97429 Non-pressure chronic ulcer of left heel and midfoot with unspecified severity: Secondary | ICD-10-CM

## 2018-02-13 DIAGNOSIS — I493 Ventricular premature depolarization: Secondary | ICD-10-CM | POA: Diagnosis not present

## 2018-02-13 DIAGNOSIS — M6281 Muscle weakness (generalized): Secondary | ICD-10-CM | POA: Diagnosis not present

## 2018-02-13 DIAGNOSIS — E1122 Type 2 diabetes mellitus with diabetic chronic kidney disease: Secondary | ICD-10-CM | POA: Diagnosis present

## 2018-02-13 DIAGNOSIS — A4102 Sepsis due to Methicillin resistant Staphylococcus aureus: Principal | ICD-10-CM | POA: Diagnosis present

## 2018-02-13 DIAGNOSIS — I4892 Unspecified atrial flutter: Secondary | ICD-10-CM | POA: Diagnosis present

## 2018-02-13 DIAGNOSIS — E1065 Type 1 diabetes mellitus with hyperglycemia: Secondary | ICD-10-CM

## 2018-02-13 DIAGNOSIS — Z794 Long term (current) use of insulin: Secondary | ICD-10-CM | POA: Diagnosis not present

## 2018-02-13 DIAGNOSIS — J9610 Chronic respiratory failure, unspecified whether with hypoxia or hypercapnia: Secondary | ICD-10-CM | POA: Diagnosis present

## 2018-02-13 DIAGNOSIS — A419 Sepsis, unspecified organism: Secondary | ICD-10-CM | POA: Diagnosis not present

## 2018-02-13 DIAGNOSIS — I517 Cardiomegaly: Secondary | ICD-10-CM | POA: Diagnosis not present

## 2018-02-13 DIAGNOSIS — E11628 Type 2 diabetes mellitus with other skin complications: Secondary | ICD-10-CM | POA: Diagnosis present

## 2018-02-13 DIAGNOSIS — L0291 Cutaneous abscess, unspecified: Secondary | ICD-10-CM | POA: Insufficient documentation

## 2018-02-13 DIAGNOSIS — R531 Weakness: Secondary | ICD-10-CM | POA: Diagnosis not present

## 2018-02-13 DIAGNOSIS — Z713 Dietary counseling and surveillance: Secondary | ICD-10-CM

## 2018-02-13 DIAGNOSIS — Z7901 Long term (current) use of anticoagulants: Secondary | ICD-10-CM

## 2018-02-13 DIAGNOSIS — E1165 Type 2 diabetes mellitus with hyperglycemia: Secondary | ICD-10-CM | POA: Diagnosis not present

## 2018-02-13 DIAGNOSIS — L89153 Pressure ulcer of sacral region, stage 3: Secondary | ICD-10-CM | POA: Diagnosis present

## 2018-02-13 DIAGNOSIS — E11621 Type 2 diabetes mellitus with foot ulcer: Secondary | ICD-10-CM | POA: Diagnosis not present

## 2018-02-13 DIAGNOSIS — G4733 Obstructive sleep apnea (adult) (pediatric): Secondary | ICD-10-CM | POA: Diagnosis present

## 2018-02-13 DIAGNOSIS — I10 Essential (primary) hypertension: Secondary | ICD-10-CM | POA: Diagnosis not present

## 2018-02-13 DIAGNOSIS — I130 Hypertensive heart and chronic kidney disease with heart failure and stage 1 through stage 4 chronic kidney disease, or unspecified chronic kidney disease: Secondary | ICD-10-CM | POA: Diagnosis present

## 2018-02-13 DIAGNOSIS — E872 Acidosis: Secondary | ICD-10-CM | POA: Diagnosis present

## 2018-02-13 DIAGNOSIS — R279 Unspecified lack of coordination: Secondary | ICD-10-CM | POA: Diagnosis not present

## 2018-02-13 DIAGNOSIS — E876 Hypokalemia: Secondary | ICD-10-CM | POA: Diagnosis present

## 2018-02-13 DIAGNOSIS — L89629 Pressure ulcer of left heel, unspecified stage: Secondary | ICD-10-CM | POA: Diagnosis present

## 2018-02-13 DIAGNOSIS — I4891 Unspecified atrial fibrillation: Secondary | ICD-10-CM | POA: Diagnosis present

## 2018-02-13 DIAGNOSIS — Z743 Need for continuous supervision: Secondary | ICD-10-CM | POA: Diagnosis not present

## 2018-02-13 DIAGNOSIS — I272 Pulmonary hypertension, unspecified: Secondary | ICD-10-CM | POA: Diagnosis present

## 2018-02-13 DIAGNOSIS — M62838 Other muscle spasm: Secondary | ICD-10-CM | POA: Diagnosis not present

## 2018-02-13 DIAGNOSIS — E875 Hyperkalemia: Secondary | ICD-10-CM

## 2018-02-13 DIAGNOSIS — I714 Abdominal aortic aneurysm, without rupture: Secondary | ICD-10-CM | POA: Diagnosis present

## 2018-02-13 DIAGNOSIS — R6520 Severe sepsis without septic shock: Secondary | ICD-10-CM | POA: Diagnosis present

## 2018-02-13 DIAGNOSIS — Z833 Family history of diabetes mellitus: Secondary | ICD-10-CM

## 2018-02-13 DIAGNOSIS — Z87891 Personal history of nicotine dependence: Secondary | ICD-10-CM

## 2018-02-13 DIAGNOSIS — R1909 Other intra-abdominal and pelvic swelling, mass and lump: Secondary | ICD-10-CM | POA: Diagnosis not present

## 2018-02-13 DIAGNOSIS — L089 Local infection of the skin and subcutaneous tissue, unspecified: Secondary | ICD-10-CM

## 2018-02-13 DIAGNOSIS — R278 Other lack of coordination: Secondary | ICD-10-CM | POA: Diagnosis not present

## 2018-02-13 DIAGNOSIS — Z79899 Other long term (current) drug therapy: Secondary | ICD-10-CM

## 2018-02-13 DIAGNOSIS — E1142 Type 2 diabetes mellitus with diabetic polyneuropathy: Secondary | ICD-10-CM | POA: Diagnosis present

## 2018-02-13 DIAGNOSIS — N183 Chronic kidney disease, stage 3 (moderate): Secondary | ICD-10-CM | POA: Diagnosis present

## 2018-02-13 DIAGNOSIS — N184 Chronic kidney disease, stage 4 (severe): Secondary | ICD-10-CM

## 2018-02-13 DIAGNOSIS — Z6841 Body Mass Index (BMI) 40.0 and over, adult: Secondary | ICD-10-CM

## 2018-02-13 DIAGNOSIS — L89620 Pressure ulcer of left heel, unstageable: Secondary | ICD-10-CM | POA: Diagnosis not present

## 2018-02-13 DIAGNOSIS — Z811 Family history of alcohol abuse and dependence: Secondary | ICD-10-CM

## 2018-02-13 DIAGNOSIS — R498 Other voice and resonance disorders: Secondary | ICD-10-CM | POA: Diagnosis not present

## 2018-02-13 DIAGNOSIS — R001 Bradycardia, unspecified: Secondary | ICD-10-CM | POA: Diagnosis present

## 2018-02-13 HISTORY — DX: Cutaneous abscess, unspecified: L02.91

## 2018-02-13 LAB — I-STAT CG4 LACTIC ACID, ED: LACTIC ACID, VENOUS: 2.17 mmol/L — AB (ref 0.5–1.9)

## 2018-02-13 LAB — BASIC METABOLIC PANEL
ANION GAP: 16 — AB (ref 5–15)
BUN: 54 mg/dL — AB (ref 6–20)
CHLORIDE: 86 mmol/L — AB (ref 101–111)
CO2: 31 mmol/L (ref 22–32)
Calcium: 8.6 mg/dL — ABNORMAL LOW (ref 8.9–10.3)
Creatinine, Ser: 2.27 mg/dL — ABNORMAL HIGH (ref 0.61–1.24)
GFR calc Af Amer: 33 mL/min — ABNORMAL LOW (ref >60)
GFR, EST NON AFRICAN AMERICAN: 29 mL/min — AB (ref >60)
GLUCOSE: 391 mg/dL — AB (ref 65–99)
POTASSIUM: 2.6 mmol/L — AB (ref 3.5–5.1)
SODIUM: 133 mmol/L — AB (ref 135–145)

## 2018-02-13 LAB — CBC
HCT: 44.3 % (ref 39.0–52.0)
HEMOGLOBIN: 14.9 g/dL (ref 13.0–17.0)
MCH: 31.2 pg (ref 26.0–34.0)
MCHC: 33.6 g/dL (ref 30.0–36.0)
MCV: 92.7 fL (ref 78.0–100.0)
Platelets: 240 10*3/uL (ref 150–400)
RBC: 4.78 MIL/uL (ref 4.22–5.81)
RDW: 16.3 % — ABNORMAL HIGH (ref 11.5–15.5)
WBC: 10.1 10*3/uL (ref 4.0–10.5)

## 2018-02-13 LAB — URINALYSIS, ROUTINE W REFLEX MICROSCOPIC
BILIRUBIN URINE: NEGATIVE
KETONES UR: NEGATIVE mg/dL
NITRITE: NEGATIVE
PROTEIN: 100 mg/dL — AB
Specific Gravity, Urine: 1.01 (ref 1.005–1.030)
pH: 5 (ref 5.0–8.0)

## 2018-02-13 LAB — GLUCOSE, CAPILLARY: GLUCOSE-CAPILLARY: 406 mg/dL — AB (ref 65–99)

## 2018-02-13 LAB — PROTIME-INR
INR: 1.49
Prothrombin Time: 17.9 seconds — ABNORMAL HIGH (ref 11.4–15.2)

## 2018-02-13 LAB — MAGNESIUM: MAGNESIUM: 1.5 mg/dL — AB (ref 1.7–2.4)

## 2018-02-13 LAB — CBG MONITORING, ED: GLUCOSE-CAPILLARY: 335 mg/dL — AB (ref 65–99)

## 2018-02-13 MED ORDER — MAGNESIUM SULFATE 2 GM/50ML IV SOLN
2.0000 g | Freq: Once | INTRAVENOUS | Status: AC
  Administered 2018-02-13: 2 g via INTRAVENOUS
  Filled 2018-02-13: qty 50

## 2018-02-13 MED ORDER — VANCOMYCIN HCL 10 G IV SOLR
1500.0000 mg | Freq: Once | INTRAVENOUS | Status: DC
  Filled 2018-02-13: qty 1500

## 2018-02-13 MED ORDER — SODIUM CHLORIDE 0.9 % IV SOLN: 2.0000 g | Freq: Once | INTRAVENOUS | Status: DC

## 2018-02-13 MED ORDER — VANCOMYCIN HCL 10 G IV SOLR
1500.0000 mg | Freq: Once | INTRAVENOUS | Status: AC
  Administered 2018-02-13: 1500 mg via INTRAVENOUS
  Filled 2018-02-13: qty 1500

## 2018-02-13 MED ORDER — SODIUM CHLORIDE 0.9 % IV BOLUS
500.0000 mL | Freq: Once | INTRAVENOUS | Status: AC
  Administered 2018-02-13: 500 mL via INTRAVENOUS

## 2018-02-13 MED ORDER — WARFARIN - PHARMACIST DOSING INPATIENT
Freq: Every day | Status: DC
Start: 2018-02-14 — End: 2018-02-18

## 2018-02-13 MED ORDER — GABAPENTIN 300 MG PO CAPS
300.0000 mg | ORAL_CAPSULE | Freq: Every morning | ORAL | Status: DC
  Administered 2018-02-14 – 2018-02-18 (×5): 300 mg via ORAL
  Filled 2018-02-13 (×5): qty 1

## 2018-02-13 MED ORDER — POTASSIUM CHLORIDE CRYS ER 20 MEQ PO TBCR
20.0000 meq | EXTENDED_RELEASE_TABLET | Freq: Once | ORAL | Status: AC
  Administered 2018-02-13: 20 meq via ORAL
  Filled 2018-02-13: qty 1

## 2018-02-13 MED ORDER — SODIUM CHLORIDE 0.9 % IV SOLN
INTRAVENOUS | Status: DC
  Administered 2018-02-13: 18:00:00 via INTRAVENOUS

## 2018-02-13 MED ORDER — SODIUM CHLORIDE 0.9 % IV BOLUS
1000.0000 mL | Freq: Once | INTRAVENOUS | Status: AC
  Administered 2018-02-13: 1000 mL via INTRAVENOUS

## 2018-02-13 MED ORDER — VANCOMYCIN HCL IN DEXTROSE 1-5 GM/200ML-% IV SOLN
1000.0000 mg | INTRAVENOUS | Status: DC
  Administered 2018-02-14 – 2018-02-17 (×4): 1000 mg via INTRAVENOUS
  Filled 2018-02-13 (×4): qty 200

## 2018-02-13 MED ORDER — WARFARIN SODIUM 7.5 MG PO TABS: 7.5000 mg | ORAL_TABLET | Freq: Every day | ORAL | Status: DC

## 2018-02-13 MED ORDER — INSULIN ASPART 100 UNIT/ML ~~LOC~~ SOLN
10.0000 [IU] | Freq: Once | SUBCUTANEOUS | Status: AC
  Administered 2018-02-13: 10 [IU] via SUBCUTANEOUS

## 2018-02-13 MED ORDER — WARFARIN SODIUM 5 MG PO TABS
10.0000 mg | ORAL_TABLET | Freq: Once | ORAL | Status: AC
Start: 2018-02-13 — End: 2018-02-13
  Administered 2018-02-13: 10 mg via ORAL
  Filled 2018-02-13: qty 2

## 2018-02-13 MED ORDER — CARVEDILOL 25 MG PO TABS
25.0000 mg | ORAL_TABLET | Freq: Two times a day (BID) | ORAL | Status: DC
  Administered 2018-02-15 – 2018-02-18 (×7): 25 mg via ORAL
  Filled 2018-02-13 (×8): qty 1

## 2018-02-13 MED ORDER — INSULIN ASPART 100 UNIT/ML ~~LOC~~ SOLN
0.0000 [IU] | Freq: Three times a day (TID) | SUBCUTANEOUS | Status: DC
Start: 2018-02-14 — End: 2018-02-14
  Administered 2018-02-14: 5 [IU] via SUBCUTANEOUS

## 2018-02-13 MED ORDER — CEFAZOLIN SODIUM-DEXTROSE 1-4 GM/50ML-% IV SOLN
1.0000 g | Freq: Once | INTRAVENOUS | Status: AC
  Administered 2018-02-13: 1 g via INTRAVENOUS
  Filled 2018-02-13: qty 50

## 2018-02-13 MED ORDER — HYDROCODONE-ACETAMINOPHEN 5-325 MG PO TABS
1.0000 | ORAL_TABLET | ORAL | Status: DC | PRN
  Administered 2018-02-13 (×2): 2 via ORAL
  Filled 2018-02-13 (×2): qty 2

## 2018-02-13 MED ORDER — SODIUM CHLORIDE 0.9 % IV SOLN
2.0000 g | INTRAVENOUS | Status: DC
  Administered 2018-02-13: 2 g via INTRAVENOUS
  Filled 2018-02-13: qty 2

## 2018-02-13 MED ORDER — ISOSORB DINITRATE-HYDRALAZINE 20-37.5 MG PO TABS
1.0000 | ORAL_TABLET | Freq: Two times a day (BID) | ORAL | Status: DC
  Administered 2018-02-13 – 2018-02-18 (×10): 1 via ORAL
  Filled 2018-02-13 (×12): qty 1

## 2018-02-13 MED ORDER — POTASSIUM CHLORIDE 10 MEQ/100ML IV SOLN
10.0000 meq | INTRAVENOUS | Status: AC
  Administered 2018-02-13 (×3): 10 meq via INTRAVENOUS
  Filled 2018-02-13 (×2): qty 100

## 2018-02-13 MED ORDER — POTASSIUM CHLORIDE CRYS ER 20 MEQ PO TBCR
40.0000 meq | EXTENDED_RELEASE_TABLET | Freq: Once | ORAL | Status: AC
  Administered 2018-02-13: 40 meq via ORAL
  Filled 2018-02-13: qty 2

## 2018-02-13 MED ORDER — MORPHINE SULFATE (PF) 4 MG/ML IV SOLN
4.0000 mg | Freq: Once | INTRAVENOUS | Status: AC
  Administered 2018-02-13: 4 mg via INTRAVENOUS
  Filled 2018-02-13: qty 1

## 2018-02-13 MED ORDER — INSULIN DETEMIR 100 UNIT/ML ~~LOC~~ SOLN
4.0000 [IU] | Freq: Every day | SUBCUTANEOUS | Status: DC
  Administered 2018-02-13: 4 [IU] via SUBCUTANEOUS
  Filled 2018-02-13: qty 0.04

## 2018-02-13 MED ORDER — INSULIN ASPART 100 UNIT/ML ~~LOC~~ SOLN
3.0000 [IU] | Freq: Three times a day (TID) | SUBCUTANEOUS | Status: DC
  Administered 2018-02-14: 3 [IU] via SUBCUTANEOUS

## 2018-02-13 NOTE — Progress Notes (Signed)
CPAP machine set up to auto titrate since patient states that he does not have a machine at home and does not wear a device.  Pt. Thinks that his CPAP level was 1 or 2??? Pt. States that he does not want to wear until ball game is over and then he will be ready.  RN to place mask on patient he he desires.

## 2018-02-13 NOTE — H&P (Signed)
HPI  Kurt Cross:[id number] DOB: June 13, 1951 DOA: 02/13/2018  PCP: Kurt Gravel, MD  Cardiologist Dr. Meda Cross  Chief Complaint: Foot wound  HPI:  67 year old male known diabetes mellitus type 2, HTN, benign systolic and diastolic heart failure, ischemic heart disease with bradycardia status post pacemaker CRT placement on Coumadin, atrial flutter chads score 4, OHS hypoventilation syndrome  Comes to emergency room with worsening on left heel and sacral ulcers-had debridement of apparent left lower extremity heel ulcer 2 days prior on 5/8 and was seen at the wound center-patient seems somewhat upset by the fact that he was not given medications for pain was not given antibiotics-he was told to get this dressed at home and when his wife dressed it she noticed some purulence patient called his primary care physician and patient came over to the emergency room at his request   He tells me he has had wounds like this before but the first time he had it operated upon was 5/8 he also has a sacral ulcer which developed when he was at [facility] about 3 weeks ago after direction to go there by Dr. Maudie Cross because of debility-he does not think he made any progress there He does not think that the wound in his lower rectum is worse  ED Course: Patient was given IV saline 50 cc/h given Norco magnesium ordered not yet obtained started on empiric antibiotic coverage with Ancef 1 g  Review of Systems:  Complains of pain both in his rectum area as well as in his heel and states that they both hurt equally Negative for fever, visual changes, sore throat, rash, new muscle aches, chest pain, SOB, dysuria, bleeding, that he has bloating of his abdomen feels more swollen than he usually does  Past Medical History:  Diagnosis Date  . AAA (abdominal aortic aneurysm)/ 4.5 cm ascending per CT angio 08/01/13 08/12/2013  . Atrial fibrillation ([place])   . Benign hypertension   . Bradycardia   . Cellulitis   . CHF  (congestive heart failure), NYHA class II ([place])   . Chronic respiratory failure ([place]) 02/10/2016  . Constipation, chronic 08/03/2013  . Cough   . Debility 02/10/2016  . Diabetes mellitus, type 2 ([place])   . Hernia of abdominal cavity   . Hx of Mobitz type II block    s/p PPM Bi-Ven  . Hyperlipidemia   . Hypertension   . Hypokalemia   . Hypoxemia   . 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 Kidney failure   . Neuropathy   . OSA (obstructive sleep apnea)   . Sleep apnea     Past Surgical History:  Procedure Laterality Date  . APPENDECTOMY    . BOWEL RESECTION  1980s?   ?colectomy/ostomy - Dr Kurt Cross  . INCISION AND DRAINAGE ABSCESS N/A 08/14/2013   Procedure: INCISION AND DRAINAGE ABSCESS, mesh ;  Surgeon: [name], MD;  Location: [facility];  Service: General;  Laterality: N/A;  . [place]?   Dr. Lindon Romp - w mesh  . KNEE SURGERY Right   . LEFT HEART CATHETERIZATION WITH CORONARY ANGIOGRAM N/A 03/17/2014   Procedure: LEFT HEART CATHETERIZATION WITH CORONARY ANGIOGRAM;  Surgeon: Kurt Demark, MD;  Location: [facility];  Service: Cardiovascular;  Laterality: N/A;  . NASAL SEPTUM SURGERY    . OSTOMY TAKE DOWN  1980s?   Dr Kurt Cross  . TONSILLECTOMY AND ADENOIDECTOMY  reports that he quit smoking about 5 years ago. His smoking use included cigarettes. He has a 40.00 pack-year smoking history. He has never used smokeless tobacco. He reports that he drinks alcohol. He reports that he does not use drugs. Mobility: Ambulates independently to usually until recently  No Known Allergies  Family History  Problem Relation Age of Onset  . Diabetes Mother   . Alcoholism Father   . Heart Problems Maternal Grandmother      Prior to Admission medications   Medication Sig Start Date End Date Taking? Authorizing Provider  carvedilol (COREG) 25 MG tablet Take 25 mg by mouth 2 (two) times daily. 07/25/14   Yes [provider]  cyclobenzaprine (FLEXERIL) 5 MG tablet Take 5 mg by mouth 3 (three) times daily.   Yes [provider]  furosemide (LASIX) 80 MG tablet Take 80 mg by mouth every other day.    Yes [provider]  gabapentin (NEURONTIN) 300 MG capsule Take 300 mg by mouth every morning. 10/17/17  Yes [provider]  insulin lispro (HUMALOG) 100 UNIT/ML injection Inject into the skin 2 (two) times daily with a meal. SLIDING SCALE-<200=0, 201-250=2 UNITS, 251-300=4 UNITS, 301-350=6 UNITS, 351-400=8 UNITS, 401-450=10 UNITS, 451-500=12 UNITS, <50 OR >500=CALL PEC    Yes [provider]  isosorbide-hydrALAZINE (BIDIL) 20-37.5 MG tablet Take 1 tablet by mouth 2 (two) times daily. 02/22/16  Yes Kurt Spark, MD  KLOR-CON M20 20 MEQ tablet Take 20 mg by mouth 2 (two) times daily. 01/06/18  Yes [provider]  metolazone (ZAROXOLYN) 2.5 MG tablet Take 2.5 mg by mouth daily.   Yes [provider]  OVER THE COUNTER MEDICATION Take 1 tablet by mouth daily. VITAMIN B-9 WITH FOLIC ACID   Yes [provider]  oxyCODONE-acetaminophen (PERCOCET/ROXICET) 5-325 MG tablet Take one tablet by mouth every four hours for pain. Do not exceed 4gm of Tylenol in 24 hours 02/19/16  Yes Cross, Kurt L, DO  rosuvastatin (CRESTOR) 10 MG tablet Take 10 mg by mouth daily.   Yes [provider]  spironolactone (ALDACTONE) 25 MG tablet Take 1 tablet (25 mg total) by mouth 2 (two) times daily. 02/22/16  Yes Kurt Spark, MD  warfarin (COUMADIN) 7.5 MG tablet Take 7.5 mg by mouth daily.    Yes [provider]    Physical Exam:  Vitals:   02/13/18 1123  BP: 137/87  Pulse: 70  Resp: 17  Temp: 98.4 F (36.9 C)  SpO2: 91%     Obese awake alert no distress-chest is clinically clear thick neck OHSS habitus Mallampati 2  S1-S2 no murmur rub or gallop  Abdomen is distended multiple areas of scar across the abdomen (surgical from  hernia) chest is clinically clear without added sound no rales no rhonchi  Patient has a 4 x 3 x 1 cm deep wound on the medial aspect of the left heel that does not appear to express pus however does not look clean in terms of the bed and the base  R OM is intact patient is euthymic  Neurologically power is 5/5 reflexes are intact sensory is grossly intact  And conversant but does seem irritable  I have personally reviewed following labs and imaging studies  Labs:   Positives creatinine 54/2.2, potassium 2.6 CBG 391 white count 10.1 hemoglobin 14 lactic acid was 2.17  Imaging studies:   xrayare performed not showing any significant concerns At this time CT of the lower extremity is pending without  contrast  Medical tests:   EKG independently reviewed: V paced without any further interpretation possible  Test discussed with performing physician:  Discussed with Dr. [name] who evaluated the patient  Decision to obtain old records:   Reviewed records  Review and summation of old records:   Summarized as below  Active Problems:   * No active hospital problems. *   Assessment/Plan Probable foot infection with cellulitis at least-we will get a CT scan to rule out deeper pocket-if this turns out to be positive will need orthopedic input, ABIs and consideration for surgical procedure--if not then we will treat with antibiotics for presumed cellulitis and de-escalate rapidly in 1 to 2 days based on lactic acid He is a vasculopath and already has abdominal aortic aneurysm--would be moderate risk to high risk for surgery based on my exam if he needed any type of procedure  Sacral decubitus X 3 to 4 weeks-actually does not look horrible-pressed significantly all over and did not express any purulence and do not feel any fluctuance therefore would cancel CT scan ordered by ED  Lactic acidosis-probably secondary to infection but possibly secondary to mild AKI he is slightly volume  depleted with sodium and chloride both low and he is on multiple diuretics in addition he has a mild anion gap acidosis He does not look overtly septic so see discussion above  Chronic kidney disease stage III-IV patient is usually on Lasix metolazo and Aldactone which have all been stopped-hydrate 50 cc/h recheck creatinine in a.m. May need much lower doses on discharge-will need cardiology coordination of care has not been seen in a while  Chronic systolic diastolic heart failure-last EF 30-35% PA peaked 55 with pulmonary hypertension Severe bradycardia status post Medtr[employer] PPM 08/11/2013 See above discussion regarding IV fluid continue Coreg 25 twice daily BiDil 1 tab twice daily holding above meds  Atrial flutter chads score above 4-see above -To keep potassium above 4 and magnesium above 2 and I will replace magnesium with 2 g of IV mag now--adding K. Dur 40 mg x 1 to the 3 runs of K that have been given recheck a.m. dose as appropriate Get an INR in the morning-was not obtained on admission  OHSS on presumed BiPAP-we will place on AutoPap  Hyperlipidemia needs statin on discharge  Ascending thoracic aorta 4.5 cm-req outpatient screening ultrasound  2 diabetes mellitus on insulin-last A1c 7.4 01/27/2016 Should repeat this in a.m. Scale coverage-reordered home insulin with moderate sliding scale coverage --blood sugar was 391-ordering 4 units of long-acting as well at bedtime correction    Severity of Illness: The appropriate patient status for this patient is INPATIENT. Inpatient status is judged to be reasonable and necessary in order to provide the required intensity of service to ensure the patient's safety. The patient's presenting symptoms, physical exam findings, and initial radiographic and laboratory data in the context of their chronic comorbidities is felt to place them at high risk for further clinical deterioration. Furthermore, it is not anticipated that the patient will  be medically stable for discharge from the hospital within 2 midnights of admission. The following factors support the patient status of inpatient.   " The patient's presenting symptoms include infection. " The worrisome physical exam findings include wound. " The initial radiographic and laboratory data are worrisome because of I do not have a clear idea if he has CT evidence of abscess. " The chronic co-morbidities include multiple and extensive as above.   * I certify that at  the point of admission it is my clinical judgment that the patient will require inpatient hospital care spanning beyond 2 midnights from the point of admission due to high intensity of service, high risk for further deterioration and high frequency of surveillance required.*     DVT prophylaxis: Subtherapeutic on Coumadin have pharmacy dose Code Status: Full Family Communication: None present Consults called: None yet may need Ortho consult depending on CT  Time spent: 81 minutes  Sacheen Arrasmith, MD  [facility] Direct contact: [phone] --[place]  --[email or web address]; password [email or web address]  7PM-7AM contact night coverage as above  02/13/2018, 3:43 PM

## 2018-02-13 NOTE — Progress Notes (Signed)
Pharmacy Antibiotic Note  Kurt Cross is a 67 y.o. male admitted on 02/13/2018 with worsening purulence of LLE heel ulcer, also hx sacral ulcer. Pharmacy has been consulted for Vancomycin & Cefepime dosing.  Plan: Vancomycin 1500mg  x1, then 1000mg  IV every 24 hours.  Goal trough 10-15 mcg/mL.  Cefepime 2gm q24hr     Temp (24hrs), Avg:98.4 F (36.9 C), Min:98.4 F (36.9 C), Max:98.4 F (36.9 C)  Recent Labs  Lab 02/13/18 1131 02/13/18 1508  WBC 10.1  --   CREATININE 2.27*  --   LATICACIDVEN  --  2.17*    CrCl cannot be calculated (Unknown ideal weight.).    No Known Allergies  Antimicrobials this admission: 5/10 Ancef 1gm x1 in ED 5/10 Cefepime >>  5/10 Vancomycin >>   Dose adjustments this admission:  Microbiology results: 5/10 BCx: sent  Prev Cx  5/8 L Calcaneus: Mod Staph aureus, Mod Proteus mirabilis   Thank you for allowing pharmacy to be a part of this patient's care.  Minda Ditto 02/13/2018 7:03 PM

## 2018-02-13 NOTE — ED Notes (Signed)
Pt and wife aware that urine sample needed.

## 2018-02-13 NOTE — Progress Notes (Addendum)
ANTICOAGULATION CONSULT NOTE - Initial Consult  Pharmacy Consult for Warfarin Indication: atrial fibrillation/flutter  No Known Allergies  Patient Measurements: Height: 6' (182.9 cm) Weight: 189 lb (85.7 kg) IBW/kg (Calculated) : 77.6   Vital Signs: Temp: 97.9 F (36.6 C) (05/10 2125) Temp Source: Oral (05/10 2125) BP: 146/73 (05/10 2125) Pulse Rate: 69 (05/10 2125)  Labs: Recent Labs    02/13/18 1131 02/13/18 2119  HGB 14.9  --   HCT 44.3  --   PLT 240  --   LABPROT  --  17.9*  INR  --  1.49  CREATININE 2.27*  --     Estimated Creatinine Clearance: 36.1 mL/min (A) (by C-G formula based on SCr of 2.27 mg/dL (H)).   Medical History: Past Medical History:  Diagnosis Date  . AAA (abdominal aortic aneurysm)/ 4.5 cm ascending per CT angio 08/01/13 08/12/2013  . Atrial fibrillation ([place])   . Benign hypertension   . Bradycardia   . Cellulitis   . CHF (congestive heart failure), NYHA class II ([place])   . Chronic respiratory failure ([place]) 02/10/2016  . Constipation, chronic 08/03/2013  . Cough   . Debility 02/10/2016  . Diabetes mellitus, type 2 ([place])   . Hernia of abdominal cavity   . Hx of Mobitz type II block    s/p PPM Bi-Ven  . Hyperlipidemia   . Hypertension   . Hypokalemia   . Hypoxemia   . 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 Kidney failure   . Neuropathy   . OSA (obstructive sleep apnea)   . Sleep apnea     Assessment: 66 y/oM on chronic warfarin therapy for a-fib/a-flutter admitted to [facility] on 02/13/2018 for foot wound. Pharmacy consulted to manage warfarin dosing while patient in the hospital. Baseline PT/INR not ordered. PTA dose reported as warfarin 7.5mg  PO daily with last dose 02/12/2018 at 0800. CBC reveals H/H, Pltc WNL.   Goal of Therapy:  INR 2-3 Monitor platelets by anticoagulation protocol: Yes   Plan:  STAT PT/INR: 17.9/1.49 (subtherapeutic) Warfarin 10mg  PO x 1 today Daily  PT/INR Monitor closely for s/sx of bleeding   Lindell Spar, PharmD, BCPS Pager: [phone] 02/13/2018 9:55 PM

## 2018-02-13 NOTE — ED Notes (Addendum)
RN and EDP [name] aware of lactic 2.17

## 2018-02-13 NOTE — ED Triage Notes (Signed)
Per Ems: Pt is coming from home. It is reported that Pt recently had a heel wound debrided on Wednesday and has not been eating since. Pt's sugar was 446.  150/82 BP 68 HR 16RR 90% on RA.  Pt is also reported to have a sacral wound. Pt was at [facility] for rehab for 9 months. Pt is not walking at home. EMS reports some contracture of the right leg.

## 2018-02-13 NOTE — ED Provider Notes (Signed)
[facility] DEPT Provider Note   CSN: [id number] Arrival date & time: 02/13/18  1105     History   Chief Complaint Chief Complaint  Patient presents with  . Hyperglycemia  . Wound Check    HPI Kurt Cross is a 67 y.o. male.  He has multiple medical problems including A. fib CHF diabetes who was just in the wound clinic 2 days ago for evaluation of a left heel ulcer and sacral ulcers.  He said the heel was debrided and since then he has had worsened pain lack of appetite and elevated blood sugars.  He states he is usually on chronic Percocet for pain and that is not touching this at all.  He called his primary care doctor Dr. Maudie Mercury who told him to come to the emergency room for admission.  He denies any chest pain or shortness of breath no abdominal pain vomiting or diarrhea.  He states he has been able to urinate on his own and is been leaking down into the sacral area which is worsening his skin breakdown there.  He also has felt feverish and chilled but there is been no documented fever.  The history is provided by the patient.  Illness  This is a new problem. The current episode started 2 days ago. The problem has not changed since onset.Pertinent negatives include no chest pain, no abdominal pain, no headaches and no shortness of breath. Nothing relieves the symptoms. He has tried nothing for the symptoms. The treatment provided no relief.    Past Medical History:  Diagnosis Date  . AAA (abdominal aortic aneurysm)/ 4.5 cm ascending per CT angio 08/01/13 08/12/2013  . Atrial fibrillation ([place])   . Benign hypertension   . Bradycardia   . Cellulitis   . CHF (congestive heart failure), NYHA class II ([place])   . Chronic respiratory failure ([place]) 02/10/2016  . Constipation, chronic 08/03/2013  . Cough   . Debility 02/10/2016  . Diabetes mellitus, type 2 ([place])   . Hernia of abdominal cavity   . Hx of Mobitz type II block    s/p PPM Bi-Ven  . Hyperlipidemia    . Hypertension   . Hypokalemia   . Hypoxemia   . 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 Kidney failure   . Neuropathy   . OSA (obstructive sleep apnea)   . Sleep apnea     Patient Active Problem List   Diagnosis Date Noted  . S/P placement of cardiac pacemaker 02/22/2016  . Atrial flutter ([place]) 02/22/2016  . Chronic anticoagulation 02/22/2016  . H/O medication noncompliance 02/22/2016  . Debility 02/10/2016  . Chronic respiratory failure ([place]) 02/10/2016  . Constipation 02/07/2016  . Bradycardia   . Acute on chronic combined systolic and diastolic CHF, NYHA class 2 ([place]) 01/27/2016  . Acute on chronic renal failure ([place]) 01/27/2016  . Cellulitis 01/27/2016  . Acute on chronic respiratory failure with hypoxia ([place]) 01/27/2016  . Elevated troponin 01/27/2016  . Hypoxia 01/26/2016  . Atrial fibrillation/flutter 10/06/2013  . 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 Recurrent ventral incisional hernia 08/14/2013  . AAA (abdominal aortic aneurysm)/ 4.5 cm ascending per CT angio 08/01/13 08/12/2013  . Right leg pain 08/11/2013  . Chronic combined systolic and diastolic CHF (congestive heart failure) ([place]) 08/04/2013  . Hypertension 06/28/2012  . Diabetes mellitus,  insulin dependent (IDDM), uncontrolled ([place]) 09/01/2007  . Hyperlipidemia 09/01/2007  . Obesity, Class III, BMI 40-49.9 (morbid obesity) ([place]) 09/01/2007  . Obstructive sleep apnea 09/01/2007    Past Surgical History:  Procedure Laterality Date  . APPENDECTOMY    . BOWEL RESECTION  1980s?   ?colectomy/ostomy - Dr Elesa Hacker  . INCISION AND DRAINAGE ABSCESS N/A 08/14/2013   Procedure: INCISION AND DRAINAGE ABSCESS, mesh ;  Surgeon: [name], MD;  Location: [facility] ORS;  Service: General;  Laterality: N/A;  . [place]?   Dr.  Lindon Romp - w mesh  . KNEE SURGERY Right   . LEFT HEART CATHETERIZATION WITH CORONARY ANGIOGRAM N/A 03/17/2014   Procedure: LEFT HEART CATHETERIZATION WITH CORONARY ANGIOGRAM;  Surgeon: [name], MD;  Location: [facility] CATH [facility];  Service: Cardiovascular;  Laterality: N/A;  . NASAL SEPTUM SURGERY    . OSTOMY TAKE DOWN  1980s?   Dr Elesa Hacker  . TONSILLECTOMY AND ADENOIDECTOMY          Home Medications    Prior to Admission medications   Medication Sig Start Date End Date Taking? Authorizing Provider  carvedilol (COREG) 25 MG tablet Take 25 mg by mouth 2 (two) times daily. 07/25/14  Yes [provider]  cyclobenzaprine (FLEXERIL) 5 MG tablet Take 5 mg by mouth 3 (three) times daily.   Yes [provider]  furosemide (LASIX) 80 MG tablet Take 80 mg by mouth every other day.    Yes [provider]  gabapentin (NEURONTIN) 300 MG capsule Take 300 mg by mouth every morning. 10/17/17  Yes [provider]  insulin lispro (HUMALOG) 100 UNIT/ML injection Inject into the skin 2 (two) times daily with a meal. SLIDING SCALE-<200=0, 201-250=2 UNITS, 251-300=4 UNITS, 301-350=6 UNITS, 351-400=8 UNITS, 401-450=10 UNITS, 451-500=12 UNITS, <50 OR >500=CALL PEC    Yes [provider]  isosorbide-hydrALAZINE (BIDIL) 20-37.5 MG tablet Take 1 tablet by mouth 2 (two) times daily. 02/22/16  Yes Dorothy Spark, MD  KLOR-CON M20 20 MEQ tablet Take 20 mg by mouth 2 (two) times daily. 01/06/18  Yes [provider]  metolazone (ZAROXOLYN) 2.5 MG tablet Take 2.5 mg by mouth daily.   Yes [provider]  OVER THE COUNTER MEDICATION Take 1 tablet by mouth daily. VITAMIN B-9 WITH FOLIC ACID   Yes [provider]  oxyCODONE-acetaminophen (PERCOCET/ROXICET) 5-325 MG tablet Take one tablet by mouth every four hours for pain. Do not exceed 4gm of Tylenol in 24 hours 02/19/16  Yes Reed, Tiffany L, DO  rosuvastatin (CRESTOR) 10 MG tablet Take 10 mg by mouth daily.    Yes [provider]  spironolactone (ALDACTONE) 25 MG tablet Take 1 tablet (25 mg total) by mouth 2 (two) times daily. 02/22/16  Yes Dorothy Spark, MD  warfarin (COUMADIN) 7.5 MG tablet Take 7.5 mg by mouth daily.    Yes [provider]    Family History Family History  Problem Relation Age of Onset  . Diabetes Mother   . Alcoholism Father   . Heart Problems Maternal Grandmother     Social History Social History   Tobacco Use  . Smoking status: Former Smoker    Packs/day: 1.00    Years: 40.00    Pack years: 40.00    Types: Cigarettes    Last attempt to quit: 08/21/2012    Years since quitting: 5.4  . Smokeless tobacco: Never Used  Substance Use Topics  . Alcohol use: Yes  Alcohol/week: 0.0 oz    Comment: occasional  . Drug use: No     Allergies   Patient has no known allergies.   Review of Systems Review of Systems  Constitutional: Positive for appetite change, chills and fever.  HENT: Negative for sneezing and sore throat.   Eyes: Negative for pain and visual disturbance.  Respiratory: Negative for cough and shortness of breath.   Cardiovascular: Negative for chest pain.  Gastrointestinal: Negative for abdominal pain, diarrhea, nausea and vomiting.  Genitourinary: Negative for dysuria and urgency.  Musculoskeletal: Positive for arthralgias. Negative for back pain and neck pain.  Skin: Positive for wound.  Neurological: Negative for numbness and headaches.     Physical Exam Updated Vital Signs BP 137/87 (BP Location: Left Arm)   Pulse 70   Temp 98.4 F (36.9 C) (Oral)   Resp 17   SpO2 91%   Physical Exam  Constitutional: He appears well-developed and well-nourished. No distress.  HENT:  Head: Normocephalic and atraumatic.  Right Ear: External ear normal.  Left Ear: External ear normal.  Nose: Nose normal.  Mouth/Throat: Oropharynx is clear and moist.  Eyes: Pupils are equal, round, and reactive to light. EOM are normal.    Neck: Normal range of motion. Neck supple.  Cardiovascular: Normal rate, regular rhythm, normal heart sounds and intact distal pulses.  Pulmonary/Chest: Effort normal. No respiratory distress. He has no rales.  Abdominal: He exhibits distension. He exhibits no mass. There is no tenderness. There is no guarding.  Musculoskeletal:  He has a left heel ulcer that is about 4 x 4 cm.  There is diffuse tenderness about the wound.  He also has some sacral decubitus ulcer.  Neurological: He is alert.  Skin: Skin is warm and dry. Capillary refill takes less than 2 seconds.     ED Treatments / Results  Labs (all labs ordered are listed, but only abnormal results are displayed) Labs Reviewed  BASIC METABOLIC PANEL - Abnormal; Notable for the following components:      Result Value   Sodium 133 (*)    Potassium 2.6 (*)    Chloride 86 (*)    Glucose, Bld 391 (*)    BUN 54 (*)    Creatinine, Ser 2.27 (*)    Calcium 8.6 (*)    GFR calc non Af Amer 29 (*)    GFR calc Af Amer 33 (*)    Anion gap 16 (*)    All other components within normal limits  CBC - Abnormal; Notable for the following components:   RDW 16.3 (*)    All other components within normal limits  CBG MONITORING, ED - Abnormal; Notable for the following components:   Glucose-Capillary 335 (*)    All other components within normal limits  CULTURE, BLOOD (ROUTINE X 2)  CULTURE, BLOOD (ROUTINE X 2)  URINALYSIS, ROUTINE W REFLEX MICROSCOPIC  I-STAT CG4 LACTIC ACID, ED    EKG EKG Interpretation  Date/Time:  Friday Feb 13 2018 15:01:26 EDT Ventricular Rate:  70 PR Interval:    QRS Duration: 147 QT Interval:  492 QTC Calculation: 531 R Axis:   0 Text Interpretation:  Ventricular-paced complexes No further analysis attempted due to paced rhythm Premature ventricular complexes similar pattern to prior 4/17 Confirmed by Aletta Edouard [id number]) on 02/13/2018 3:08:40 PM   Radiology Dg Chest 1 View  Result Date:  02/13/2018 CLINICAL DATA:  67 year old male with with hyperglycemia, decreased p.o. intake. Recent surgery for debridement of a foot/heel wound.  EXAM: CHEST  1 VIEW COMPARISON:  Portable chest radiographs 01/26/2016 and earlier. FINDINGS: Semi upright AP and lateral views of the chest. Chronic cardiomegaly and left chest cardiac pacemaker. Lordotic AP view today. Stable lung volumes. No pneumothorax, pulmonary edema, pleural effusion or confluent pulmonary opacity identified. No acute osseous abnormality identified. Paucity bowel gas in the upper abdomen. IMPRESSION: Stable cardiomegaly. No acute cardiopulmonary abnormality. Electronically Signed   By: Genevie Ann M.D.   On: 02/13/2018 16:55   Ct Pelvis Wo Contrast  Result Date: 02/13/2018 CLINICAL DATA:  Soft tissue swelling. Concern for sacral decubitus ulcer or abscess. EXAM: CT PELVIS WITHOUT CONTRAST TECHNIQUE: Multidetector CT imaging of the pelvis was performed following the standard protocol without intravenous contrast. COMPARISON:  CT abdomen pelvis 11/10/2017 FINDINGS: Urinary Tract:  No abnormality visualized. Bowel:  Unremarkable visualized pelvic bowel loops. Vascular/Lymphatic: Atherosclerotic calcification of the aorta and iliac arteries. Reproductive:  No mass or other significant abnormality Other:  None. Musculoskeletal: No suspicious bone lesions identified. There is no sacral decubitus ulcer or subcutaneous lesion. No abscess or fluid collection. IMPRESSION: No sacral decubitus ulcer or abscess. Aortic Atherosclerosis (ICD10-I70.0). Electronically Signed   By: Ulyses Jarred M.D.   On: 02/13/2018 17:14   Dg Foot Complete Left  Result Date: 02/13/2018 CLINICAL DATA:  Heel ulcer and foot pain EXAM: LEFT FOOT - COMPLETE 3+ VIEW COMPARISON:  Left foot radiograph 02/11/2018 FINDINGS: Diffuse osteopenia. No acute fracture or dislocation. Previously seen heel ulcer is not visible due to positioning. No visible osteolysis. IMPRESSION: Allowing for  differences in positioning, unchanged radiographs of the left foot without osseous acute abnormality. Electronically Signed   By: Ulyses Jarred M.D.   On: 02/13/2018 16:55   Ct Extremity Lower Left Wo Contrast  Result Date: 02/13/2018 CLINICAL DATA:  LEFT heel ulcer, sacral ulcer, atrial fibrillation, CHF, diabetes mellitus, fever, CHF, former smoker EXAM: CT OF THE LOWER LEFT EXTREMITY WITHOUT CONTRAST TECHNIQUE: Multidetector CT imaging of the lower left extremity was performed according to the standard protocol. Sagittal and coronal MPR images reconstructed from axial data set. COMPARISON:  CT pelvis 02/13/2018, FINDINGS: Bones/Joint/Cartilage Osseous demineralization. Visualized pelvis intact. Degenerative changes of LEFT hip joint with mild joint space narrowing and spur formation. Degenerative changes are also present at the LEFT knee joint with joint space narrowing, minimal spurring, chondrocalcinosis, and associated knee joint effusion. No fracture or dislocation. No bone destruction identified to suggest osteomyelitis. Mild scattered degenerative changes at intertarsal joints and at ankle joint. Ligaments Suboptimally assessed by CT. Muscles and Tendons Tendons suboptimally assessed by CT. Fatty atrophy of the medial LEFT gastrocnemius muscle. Muscular planes appear atrophic but otherwise grossly normal appearance. Soft tissues Skin thickening and subcutaneous edema at the posteromedial LEFT heel region likely representing patient's heel ulcer. Soft tissue swelling extends into the medial soft tissues at the plantar arch. LEFT knee joint effusion. No definite hip joint effusion. Scattered atherosclerotic calcifications. No abnormal fluid collections to suggest abscess. Multiple normal to upper normal sized LEFT inguinal lymph nodes. LEFT inguinal hernia containing fat. Intrapelvic structures otherwise unremarkable. IMPRESSION: No CT evidence of bone destruction adjacent to the posteromedial heel ulcer  to suggest osteomyelitis. Scattered degenerative changes at LEFT hip, knee, and ankle/foot with associated LEFT knee joint effusion. Fatty atrophy of medial LEFT gastrocnemius muscle. Small LEFT inguinal hernia containing fat. Electronically Signed   By: Lavonia Dana M.D.   On: 02/13/2018 17:45    Procedures Procedures (including critical care time)  Medications Ordered in ED Medications  sodium chloride 0.9 % bolus 500 mL (has no administration in time range)     Initial Impression / Assessment and Plan / ED Course  I have reviewed the triage vital signs and the nursing notes.  Pertinent labs & imaging results that were available during my care of the patient were reviewed by me and considered in my medical decision making (see chart for details).  Clinical Course as of Feb 16 1343  Fri Feb 13, 2018  1523 Cussed with the hospitalist who recommends getting a CT of the sacrum to make sure that there is no extension.  He also has that would start antibiotic coverage for cellulitis.  He will evaluate the patient in the ED.   [MB]    Clinical Course User Index [MB] Hayden Rasmussen, MD     Final Clinical Impressions(s) / ED Diagnoses   Final diagnoses:  Diabetic foot infection ([place])  Hyperglycemia  Hypokalemia    ED Discharge Orders    None       Hayden Rasmussen, MD 02/15/18 1345

## 2018-02-13 NOTE — ED Notes (Signed)
Bed: WA05 Expected date:  Expected time:  Means of arrival:  Comments: EMS 

## 2018-02-13 NOTE — ED Notes (Signed)
Patient has been asked multiple times to provide a urine specimen. Urinal at bedside

## 2018-02-14 ENCOUNTER — Encounter (HOSPITAL_COMMUNITY): Payer: Self-pay

## 2018-02-14 DIAGNOSIS — I482 Chronic atrial fibrillation: Secondary | ICD-10-CM

## 2018-02-14 DIAGNOSIS — L03116 Cellulitis of left lower limb: Secondary | ICD-10-CM

## 2018-02-14 DIAGNOSIS — L89153 Pressure ulcer of sacral region, stage 3: Secondary | ICD-10-CM

## 2018-02-14 DIAGNOSIS — I5042 Chronic combined systolic (congestive) and diastolic (congestive) heart failure: Secondary | ICD-10-CM

## 2018-02-14 DIAGNOSIS — E1065 Type 1 diabetes mellitus with hyperglycemia: Secondary | ICD-10-CM

## 2018-02-14 DIAGNOSIS — I10 Essential (primary) hypertension: Secondary | ICD-10-CM

## 2018-02-14 DIAGNOSIS — R6520 Severe sepsis without septic shock: Secondary | ICD-10-CM

## 2018-02-14 DIAGNOSIS — A419 Sepsis, unspecified organism: Secondary | ICD-10-CM

## 2018-02-14 HISTORY — DX: Cellulitis of left lower limb: L03.116

## 2018-02-14 HISTORY — DX: Pressure ulcer of sacral region, stage 3: L89.153

## 2018-02-14 LAB — COMPREHENSIVE METABOLIC PANEL
ALK PHOS: 24 U/L — AB (ref 38–126)
ALT: 16 U/L — AB (ref 17–63)
ANION GAP: 16 — AB (ref 5–15)
AST: 17 U/L (ref 15–41)
Albumin: 2.5 g/dL — ABNORMAL LOW (ref 3.5–5.0)
BUN: 50 mg/dL — ABNORMAL HIGH (ref 6–20)
CALCIUM: 8.6 mg/dL — AB (ref 8.9–10.3)
CO2: 32 mmol/L (ref 22–32)
CREATININE: 2.23 mg/dL — AB (ref 0.61–1.24)
Chloride: 88 mmol/L — ABNORMAL LOW (ref 101–111)
GFR, EST AFRICAN AMERICAN: 34 mL/min — AB (ref >60)
GFR, EST NON AFRICAN AMERICAN: 29 mL/min — AB (ref >60)
Glucose, Bld: 257 mg/dL — ABNORMAL HIGH (ref 65–99)
Potassium: 2.3 mmol/L — CL (ref 3.5–5.1)
SODIUM: 136 mmol/L (ref 135–145)
Total Bilirubin: 0.6 mg/dL (ref 0.3–1.2)
Total Protein: 7.1 g/dL (ref 6.5–8.1)

## 2018-02-14 LAB — CBC
HCT: 42.7 % (ref 39.0–52.0)
HEMOGLOBIN: 14.1 g/dL (ref 13.0–17.0)
MCH: 30.5 pg (ref 26.0–34.0)
MCHC: 33 g/dL (ref 30.0–36.0)
MCV: 92.2 fL (ref 78.0–100.0)
PLATELETS: 257 10*3/uL (ref 150–400)
RBC: 4.63 MIL/uL (ref 4.22–5.81)
RDW: 16.5 % — ABNORMAL HIGH (ref 11.5–15.5)
WBC: 11.3 10*3/uL — AB (ref 4.0–10.5)

## 2018-02-14 LAB — GLUCOSE, CAPILLARY
Glucose-Capillary: 293 mg/dL — ABNORMAL HIGH (ref 65–99)
Glucose-Capillary: 275 mg/dL — ABNORMAL HIGH (ref 65–99)
Glucose-Capillary: 252 mg/dL — ABNORMAL HIGH (ref 65–99)
Glucose-Capillary: 236 mg/dL — ABNORMAL HIGH (ref 65–99)

## 2018-02-14 LAB — HIV ANTIBODY (ROUTINE TESTING W REFLEX): HIV SCREEN 4TH GENERATION: NONREACTIVE

## 2018-02-14 LAB — HEMOGLOBIN A1C
HEMOGLOBIN A1C: 10 % — AB (ref 4.8–5.6)
MEAN PLASMA GLUCOSE: 240.3 mg/dL

## 2018-02-14 LAB — PROTIME-INR
INR: 1.44
Prothrombin Time: 17.4 seconds — ABNORMAL HIGH (ref 11.4–15.2)

## 2018-02-14 MED ORDER — SODIUM CHLORIDE 0.9 % IV SOLN
1.0000 g | INTRAVENOUS | Status: DC
  Administered 2018-02-14 – 2018-02-15 (×2): 1 g via INTRAVENOUS
  Filled 2018-02-14 (×2): qty 1

## 2018-02-14 MED ORDER — INSULIN ASPART 100 UNIT/ML ~~LOC~~ SOLN
0.0000 [IU] | Freq: Three times a day (TID) | SUBCUTANEOUS | Status: DC
  Administered 2018-02-14 (×2): 5 [IU] via SUBCUTANEOUS
  Administered 2018-02-15: 7 [IU] via SUBCUTANEOUS
  Administered 2018-02-15 (×2): 3 [IU] via SUBCUTANEOUS
  Administered 2018-02-16 (×3): 5 [IU] via SUBCUTANEOUS
  Administered 2018-02-17: 3 [IU] via SUBCUTANEOUS
  Administered 2018-02-17: 5 [IU] via SUBCUTANEOUS
  Administered 2018-02-17 – 2018-02-18 (×2): 3 [IU] via SUBCUTANEOUS
  Administered 2018-02-18: 5 [IU] via SUBCUTANEOUS

## 2018-02-14 MED ORDER — INSULIN ASPART 100 UNIT/ML ~~LOC~~ SOLN
0.0000 [IU] | Freq: Every day | SUBCUTANEOUS | Status: DC
  Administered 2018-02-14: 2 [IU] via SUBCUTANEOUS
  Administered 2018-02-16: 3 [IU] via SUBCUTANEOUS
  Administered 2018-02-17: 2 [IU] via SUBCUTANEOUS

## 2018-02-14 MED ORDER — POTASSIUM CHLORIDE CRYS ER 20 MEQ PO TBCR
40.0000 meq | EXTENDED_RELEASE_TABLET | Freq: Two times a day (BID) | ORAL | Status: AC
  Administered 2018-02-14 – 2018-02-15 (×2): 40 meq via ORAL
  Filled 2018-02-14 (×2): qty 2

## 2018-02-14 MED ORDER — INSULIN ASPART 100 UNIT/ML ~~LOC~~ SOLN
3.0000 [IU] | Freq: Three times a day (TID) | SUBCUTANEOUS | Status: DC
  Administered 2018-02-14 – 2018-02-18 (×11): 3 [IU] via SUBCUTANEOUS

## 2018-02-14 MED ORDER — SODIUM CHLORIDE 0.9 % IV SOLN
INTRAVENOUS | Status: AC
  Administered 2018-02-14 (×2): via INTRAVENOUS

## 2018-02-14 MED ORDER — OXYCODONE-ACETAMINOPHEN 5-325 MG PO TABS
1.0000 | ORAL_TABLET | ORAL | Status: DC | PRN
  Administered 2018-02-14 – 2018-02-18 (×20): 1 via ORAL
  Filled 2018-02-14 (×20): qty 1

## 2018-02-14 MED ORDER — INSULIN DETEMIR 100 UNIT/ML ~~LOC~~ SOLN
4.0000 [IU] | Freq: Two times a day (BID) | SUBCUTANEOUS | Status: DC
  Administered 2018-02-14 (×2): 4 [IU] via SUBCUTANEOUS
  Filled 2018-02-14 (×4): qty 0.04

## 2018-02-14 MED ORDER — WARFARIN SODIUM 5 MG PO TABS
10.0000 mg | ORAL_TABLET | Freq: Once | ORAL | Status: AC
  Administered 2018-02-14: 10 mg via ORAL
  Filled 2018-02-14: qty 2

## 2018-02-14 NOTE — Progress Notes (Signed)
[facility] PROGRESS NOTE    Progress Note  Kurt Cross  LGX:[id number] DOB: 05/27/1951 DOA: 02/13/2018 PCP: [name], MD     Brief Narrative:   Kurt Cross is an 67 y.o. male *past medical history of diabetes mellitus type 2, essential hypertension and  systolic and diastolic heart failure, status post pacemaker with A. fib with a chads score greater than 4 on Coumadin comes into the emergency room with left heel sacral ulcer  Assessment/Plan:   Severe sepsis secondary t Cellulitis of left lower extremity CT scan of the left foot show no abscesses. He was started empirically on IV vancomycin and cefepime, there is no history of Pseudomonas in the chart.  We will discontinue cefepime start IV Rocephin continue vancomycin.  She has remained afebrile Culture data is pending.  Sacral decubitus ulcer: Consult WOC.  Chronic kidney disease stage III: At home on Lasix metolazone and Aldactone. With baseline creatinine of around 2. Agree with holding diuretics for an additional 24 hours. KVO IV fluids.  Chronic diastolic and systolic heart failure: Resume Coreg and BiDil.  A. fib with a chads score greater than 4: Continue Coreg and Coumadin check INR daily.  Diabetes mellitus, insulin dependent (IDDM), uncontrolled ([name]) Start on sliding scale insulin continue Levemir twice a day. ABGs before meals and at bedtime, check an A1c.    Obesity, Class III, BMI 40-49.9 (morbid obesity) (HCC) Counseling  Essential hypertension Pressure seems to be well-controlled.  Hyperkalemia: Replete orally, recheck in the morning.   DVT prophylaxis: coumadin Family Communication:none home in 3 days Disposition Plan/Barrier to D/C: home in 3-4 days Code Status:     Code Status Orders  (From admission, onward)        Start     Ordered   02/13/18 1605  Full code  Continuous     02/13/18 1607    Code Status History    Date Active Date Inactive Code Status Order ID Comments  User Context   01/27/2016 0143 02/06/2016 1804 Full Code [id number]  Toy [name], MD Inpatient   03/17/2014 1347 03/17/2014 2219 Full Code [id number]  Charolette Forward, MD Inpatient   11/12/2013 0158 11/18/2013 1830 Full Code [id number]  [name], DO ED   08/11/2013 1818 10/15/2013 2042 Full Code [id number]  [name], MD ED   08/02/2013 1348 08/05/2013 2027 DNR [id number]  Rama, Venetia Maxon, MD Inpatient   08/01/2013 2240 08/02/2013 1348 Full Code [id number]  [name], MD Inpatient   08/24/2012 1423 08/27/2012 1610 Full Code [id number]  [name], RN Inpatient   06/28/2012 0910 06/29/2012 1911 Full Code [id number]  [name], RN Inpatient    Advance Directive Documentation     Most Recent Value  Type of Advance Directive  Healthcare Power of Attorney  Pre-existing out of facility DNR order (yellow form or pink MOST form)  -  "MOST" Form in Place?  -        IV Access:    Peripheral IV   Procedures and diagnostic studies:   Dg Chest 1 View  Result Date: 02/13/2018 CLINICAL DATA:  67 year old male with with hyperglycemia, decreased p.o. intake. Recent surgery for debridement of a foot/heel wound. EXAM: CHEST  1 VIEW COMPARISON:  Portable chest radiographs 01/26/2016 and earlier. FINDINGS: Semi upright AP and lateral views of the chest. Chronic cardiomegaly and left chest cardiac pacemaker. Lordotic AP view today. Stable lung volumes. No pneumothorax, pulmonary edema, pleural effusion or confluent pulmonary opacity identified.  No acute osseous abnormality identified. Paucity bowel gas in the upper abdomen. IMPRESSION: Stable cardiomegaly. No acute cardiopulmonary abnormality. Electronically Signed   By: Genevie Ann M.D.   On: 02/13/2018 16:55   Ct Pelvis Wo Contrast  Result Date: 02/13/2018 CLINICAL DATA:  Soft tissue swelling. Concern for sacral decubitus ulcer or abscess. EXAM: CT PELVIS WITHOUT CONTRAST TECHNIQUE: Multidetector CT imaging of the pelvis  was performed following the standard protocol without intravenous contrast. COMPARISON:  CT abdomen pelvis 11/10/2017 FINDINGS: Urinary Tract:  No abnormality visualized. Bowel:  Unremarkable visualized pelvic bowel loops. Vascular/Lymphatic: Atherosclerotic calcification of the aorta and iliac arteries. Reproductive:  No mass or other significant abnormality Other:  None. Musculoskeletal: No suspicious bone lesions identified. There is no sacral decubitus ulcer or subcutaneous lesion. No abscess or fluid collection. IMPRESSION: No sacral decubitus ulcer or abscess. Aortic Atherosclerosis (ICD10-I70.0). Electronically Signed   By: Ulyses Jarred M.D.   On: 02/13/2018 17:14   Dg Foot Complete Left  Result Date: 02/13/2018 CLINICAL DATA:  Heel ulcer and foot pain EXAM: LEFT FOOT - COMPLETE 3+ VIEW COMPARISON:  Left foot radiograph 02/11/2018 FINDINGS: Diffuse osteopenia. No acute fracture or dislocation. Previously seen heel ulcer is not visible due to positioning. No visible osteolysis. IMPRESSION: Allowing for differences in positioning, unchanged radiographs of the left foot without osseous acute abnormality. Electronically Signed   By: Ulyses Jarred M.D.   On: 02/13/2018 16:55   Ct Extremity Lower Left Wo Contrast  Result Date: 02/13/2018 CLINICAL DATA:  LEFT heel ulcer, sacral ulcer, atrial fibrillation, CHF, diabetes mellitus, fever, CHF, former smoker EXAM: CT OF THE LOWER LEFT EXTREMITY WITHOUT CONTRAST TECHNIQUE: Multidetector CT imaging of the lower left extremity was performed according to the standard protocol. Sagittal and coronal MPR images reconstructed from axial data set. COMPARISON:  CT pelvis 02/13/2018, FINDINGS: Bones/Joint/Cartilage Osseous demineralization. Visualized pelvis intact. Degenerative changes of LEFT hip joint with mild joint space narrowing and spur formation. Degenerative changes are also present at the LEFT knee joint with joint space narrowing, minimal spurring,  chondrocalcinosis, and associated knee joint effusion. No fracture or dislocation. No bone destruction identified to suggest osteomyelitis. Mild scattered degenerative changes at intertarsal joints and at ankle joint. Ligaments Suboptimally assessed by CT. Muscles and Tendons Tendons suboptimally assessed by CT. Fatty atrophy of the medial LEFT gastrocnemius muscle. Muscular planes appear atrophic but otherwise grossly normal appearance. Soft tissues Skin thickening and subcutaneous edema at the posteromedial LEFT heel region likely representing patient's heel ulcer. Soft tissue swelling extends into the medial soft tissues at the plantar arch. LEFT knee joint effusion. No definite hip joint effusion. Scattered atherosclerotic calcifications. No abnormal fluid collections to suggest abscess. Multiple normal to upper normal sized LEFT inguinal lymph nodes. LEFT inguinal hernia containing fat. Intrapelvic structures otherwise unremarkable. IMPRESSION: No CT evidence of bone destruction adjacent to the posteromedial heel ulcer to suggest osteomyelitis. Scattered degenerative changes at LEFT hip, knee, and ankle/foot with associated LEFT knee joint effusion. Fatty atrophy of medial LEFT gastrocnemius muscle. Small LEFT inguinal hernia containing fat. Electronically Signed   By: Lavonia Dana M.D.   On: 02/13/2018 17:45     Medical Consultants:    None.  Anti-Infectives:   IV vancomycin and Rocephin.  Subjective:    [name] he relates he feels slightly better has remained afebrile.  Objective:    Vitals:   02/13/18 2023 02/13/18 2125 02/13/18 2211 02/14/18 0609  BP:  (!) 146/73  125/66  Pulse:  69  70  Resp:    15  Temp:  97.9 F (36.6 C)  98.1 F (36.7 C)  TempSrc:  Oral  Oral  SpO2:  (!) 56%  97%  Weight: 85.7 kg (189 lb)  128.4 kg (283 lb)   Height: 6' (1.829 m)  6' (1.829 m)     Intake/Output Summary (Last 24 hours) at 02/14/2018 0838 Last data filed at 02/14/2018 0610 Gross per  24 hour  Intake 1971.1 ml  Output -  Net 1971.1 ml   Filed Weights   02/13/18 2023 02/13/18 2211  Weight: 85.7 kg (189 lb) 128.4 kg (283 lb)    Exam: General exam: In no acute distress. Respiratory system: Good air movement and clear to auscultation. Cardiovascular system: S1 & S2 heard, RRR.  Gastrointestinal system: Abdomen is nondistended, soft and nontender.  Central nervous system: Alert and oriented. No focal neurological deficits. Extremities: Lower extremity edema Skin: Has a heel ulcer that is purulent, and a sacral decubitus ulcer stage III looks clean Psychiatry: Judgement and insight appear normal. Mood & affect appropriate.    Data Reviewed:    Labs: Basic Metabolic Panel: Recent Labs  Lab 02/13/18 1131 02/13/18 1454 02/14/18 0510  NA 133*  --  136  K 2.6*  --  2.3*  CL 86*  --  88*  CO2 31  --  32  GLUCOSE 391*  --  257*  BUN 54*  --  50*  CREATININE 2.27*  --  2.23*  CALCIUM 8.6*  --  8.6*  MG  --  1.5*  --    GFR Estimated Creatinine Clearance: 46.3 mL/min (A) (by C-G formula based on SCr of 2.23 mg/dL (H)). Liver Function Tests: Recent Labs  Lab 02/14/18 0510  AST 17  ALT 16*  ALKPHOS 24*  BILITOT 0.6  PROT 7.1  ALBUMIN 2.5*   No results for input(s): LIPASE, AMYLASE in the last 168 hours. No results for input(s): AMMONIA in the last 168 hours. Coagulation profile Recent Labs  Lab 02/13/18 2119 02/14/18 0511  INR 1.49 1.44    CBC: Recent Labs  Lab 02/13/18 1131 02/14/18 0510  WBC 10.1 11.3*  HGB 14.9 14.1  HCT 44.3 42.7  MCV 92.7 92.2  PLT 240 257   Cardiac Enzymes: No results for input(s): CKTOTAL, CKMB, CKMBINDEX, TROPONINI in the last 168 hours. BNP (last 3 results) No results for input(s): PROBNP in the last 8760 hours. CBG: Recent Labs  Lab 02/13/18 1127 02/13/18 2229 02/14/18 0803  GLUCAP 335* 406* 293*   D-Dimer: No results for input(s): DDIMER in the last 72 hours. Hgb A1c: No results for input(s):  HGBA1C in the last 72 hours. Lipid Profile: No results for input(s): CHOL, HDL, LDLCALC, TRIG, CHOLHDL, LDLDIRECT in the last 72 hours. Thyroid function studies: No results for input(s): TSH, T4TOTAL, T3FREE, THYROIDAB in the last 72 hours.  Invalid input(s): FREET3 Anemia work up: No results for input(s): VITAMINB12, FOLATE, FERRITIN, TIBC, IRON, RETICCTPCT in the last 72 hours. Sepsis Labs: Recent Labs  Lab 02/13/18 1131 02/13/18 1508 02/14/18 0510  WBC 10.1  --  11.3*  LATICACIDVEN  --  2.17*  --    Microbiology Recent Results (from the past 240 hour(s))  Aerobic Culture (superficial specimen)     Status: None (Preliminary result)   Collection Time: 02/11/18  3:20 PM  Result Value Ref Range Status   Specimen Description   Final    WOUND LEFT CALCANEUS Performed at [facility], [address]  [place], [place] [address]    Special Requests   Final    NONE Performed at [facility], [place] [address] [place], [place] [address]    Gram Stain   Final    RARE WBC PRESENT, PREDOMINANTLY PMN RARE GRAM NEGATIVE RODS    Culture   Final    MODERATE STAPHYLOCOCCUS AUREUS MODERATE PROTEUS MIRABILIS    Report Status PENDING  Incomplete     Medications:   . carvedilol  25 mg Oral BID WC  . gabapentin  300 mg Oral q morning - 10a  . insulin aspart  0-9 Units Subcutaneous TID WC  . insulin aspart  3 Units Subcutaneous TID WC  . insulin detemir  4 Units Subcutaneous QHS  . isosorbide-hydrALAZINE  1 tablet Oral BID  . Warfarin - Pharmacist Dosing Inpatient   Does not apply q1800   Continuous Infusions: . sodium chloride 50 mL/hr at 02/13/18 1733  . ceFEPime (MAXIPIME) IV Stopped (02/13/18 2255)  . vancomycin        LOS: 1 day   Charlynne Cousins  [facility] Pager [phone]  *Please refer to [email or web address], password [email or web address] to get updated schedule on who will round on this patient, as hospitalists switch teams weekly. If  7PM-7AM, please contact night-coverage at [email or web address], password [email or web address] for any overnight needs.  02/14/2018, 8:38 AM

## 2018-02-14 NOTE — Progress Notes (Signed)
ANTICOAGULATION CONSULT NOTE - Follow Up  Pharmacy Consult for Warfarin Indication: atrial fibrillation/flutter  No Known Allergies  Patient Measurements: Height: 6' (182.9 cm) Weight: 283 lb (128.4 kg) IBW/kg (Calculated) : 77.6   Vital Signs: Temp: 98.1 F (36.7 C) (05/11 0609) Temp Source: Oral (05/11 0609) BP: 125/66 (05/11 0609) Pulse Rate: 70 (05/11 0609)  Labs: Recent Labs    02/13/18 1131 02/13/18 2119 02/14/18 0510 02/14/18 0511  HGB 14.9  --  14.1  --   HCT 44.3  --  42.7  --   PLT 240  --  257  --   LABPROT  --  17.9*  --  17.4*  INR  --  1.49  --  1.44  CREATININE 2.27*  --  2.23*  --     Estimated Creatinine Clearance: 46.3 mL/min (A) (by C-G formula based on SCr of 2.23 mg/dL (H)).   Medical History: Past Medical History:  Diagnosis Date  . AAA (abdominal aortic aneurysm)/ 4.5 cm ascending per CT angio 08/01/13 08/12/2013  . Atrial fibrillation ([place])   . Benign hypertension   . Bradycardia   . Cellulitis   . CHF (congestive heart failure), NYHA class II ([place])   . Chronic respiratory failure ([place]) 02/10/2016  . Constipation, chronic 08/03/2013  . Cough   . Debility 02/10/2016  . Diabetes mellitus, type 2 ([place])   . Hernia of abdominal cavity   . Hx of Mobitz type II block    s/p PPM Bi-Ven  . Hyperlipidemia   . Hypertension   . Hypokalemia   . Hypoxemia   . 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 Kidney failure   . Neuropathy   . OSA (obstructive sleep apnea)   . Sleep apnea     Assessment: 66 y/oM on chronic warfarin therapy for a-fib/a-flutter admitted to [facility] on 02/13/2018 for foot wound. Pharmacy consulted to manage warfarin dosing while patient in the hospital. Baseline PT/INR not ordered. PTA dose reported as warfarin 7.5mg  PO daily with last dose 02/12/2018 at 0800. CBC reveals H/H, Pltc WNL.   Today, 02/14/18  INR subtherapeutic - 10mg  warfarin given last PM  Stable CBC   no  reported bleeding  Goal of Therapy:  INR 2-3 Monitor platelets by anticoagulation protocol: Yes   Plan:  1) Repeat warfarin 10mg  today at 1800 2) Daily INR  Adrian Saran, PharmD, BCPS Pager [phone] 02/14/2018 10:10 AM;

## 2018-02-14 NOTE — Progress Notes (Signed)
Transferred to 1536. Report given to Alphonzo Lemmings, RN. No changes in assessment. Ohanna Gassert, [employer]

## 2018-02-14 NOTE — Progress Notes (Signed)
Text page sent to Dr Olevia Bowens re drop in K+ level to 2.3 & reminder that he is not on telemetry unit. Aunna Snooks, [employer]

## 2018-02-15 DIAGNOSIS — E876 Hypokalemia: Secondary | ICD-10-CM

## 2018-02-15 LAB — AEROBIC CULTURE  (SUPERFICIAL SPECIMEN)

## 2018-02-15 LAB — AEROBIC CULTURE W GRAM STAIN (SUPERFICIAL SPECIMEN)

## 2018-02-15 LAB — BASIC METABOLIC PANEL
ANION GAP: 15 (ref 5–15)
BUN: 39 mg/dL — ABNORMAL HIGH (ref 6–20)
CO2: 29 mmol/L (ref 22–32)
Calcium: 8.3 mg/dL — ABNORMAL LOW (ref 8.9–10.3)
Chloride: 90 mmol/L — ABNORMAL LOW (ref 101–111)
Creatinine, Ser: 1.76 mg/dL — ABNORMAL HIGH (ref 0.61–1.24)
GFR calc non Af Amer: 39 mL/min — ABNORMAL LOW (ref >60)
GFR, EST AFRICAN AMERICAN: 45 mL/min — AB (ref >60)
GLUCOSE: 264 mg/dL — AB (ref 65–99)
Potassium: 2.8 mmol/L — ABNORMAL LOW (ref 3.5–5.1)
Sodium: 134 mmol/L — ABNORMAL LOW (ref 135–145)

## 2018-02-15 LAB — GLUCOSE, CAPILLARY
GLUCOSE-CAPILLARY: 320 mg/dL — AB (ref 65–99)
GLUCOSE-CAPILLARY: 222 mg/dL — AB (ref 65–99)
Glucose-Capillary: 218 mg/dL — ABNORMAL HIGH (ref 65–99)
Glucose-Capillary: 179 mg/dL — ABNORMAL HIGH (ref 65–99)

## 2018-02-15 LAB — PROTIME-INR
INR: 1.66
PROTHROMBIN TIME: 19.5 s — AB (ref 11.4–15.2)

## 2018-02-15 MED ORDER — FUROSEMIDE 40 MG PO TABS
80.0000 mg | ORAL_TABLET | ORAL | Status: DC
  Administered 2018-02-15 – 2018-02-17 (×2): 80 mg via ORAL
  Filled 2018-02-15 (×2): qty 2

## 2018-02-15 MED ORDER — WARFARIN SODIUM 5 MG PO TABS
10.0000 mg | ORAL_TABLET | Freq: Once | ORAL | Status: AC
  Administered 2018-02-15: 10 mg via ORAL
  Filled 2018-02-15: qty 4

## 2018-02-15 MED ORDER — SPIRONOLACTONE 25 MG PO TABS
25.0000 mg | ORAL_TABLET | Freq: Two times a day (BID) | ORAL | Status: DC
  Administered 2018-02-15 – 2018-02-18 (×6): 25 mg via ORAL
  Filled 2018-02-15 (×7): qty 1

## 2018-02-15 MED ORDER — ROSUVASTATIN CALCIUM 10 MG PO TABS
10.0000 mg | ORAL_TABLET | Freq: Every day | ORAL | Status: DC
  Administered 2018-02-15 – 2018-02-18 (×4): 10 mg via ORAL
  Filled 2018-02-15 (×4): qty 1

## 2018-02-15 MED ORDER — INSULIN DETEMIR 100 UNIT/ML ~~LOC~~ SOLN
10.0000 [IU] | Freq: Two times a day (BID) | SUBCUTANEOUS | Status: DC
  Administered 2018-02-15 – 2018-02-18 (×7): 10 [IU] via SUBCUTANEOUS
  Filled 2018-02-15 (×8): qty 0.1

## 2018-02-15 MED ORDER — POTASSIUM CHLORIDE CRYS ER 20 MEQ PO TBCR
20.0000 meq | EXTENDED_RELEASE_TABLET | Freq: Two times a day (BID) | ORAL | Status: DC
  Administered 2018-02-15 – 2018-02-16 (×2): 20 meq via ORAL
  Filled 2018-02-15 (×2): qty 1

## 2018-02-15 NOTE — Progress Notes (Signed)
Pt. placed on CPAP for h/s, humidifier refilled with S/W, adjusted low pressure up from 5 cmh20  to 10 cmh20 per pt. request, tolerating well, made aware to notify if needed.

## 2018-02-15 NOTE — Progress Notes (Signed)
[facility] PROGRESS NOTE    Progress Note  Kurt Cross  YSA:[id number] DOB: [birth year] DOA: 02/13/2018 PCP: [name], MD     Brief Narrative:   Kurt Cross is an 67 y.o. male *past medical history of diabetes mellitus type 2, essential hypertension and  systolic and diastolic heart failure, status post pacemaker with A. fib with a chads score greater than 4 on Coumadin comes into the emergency room with left heel sacral ulcer  Assessment/Plan:   Severe sepsis secondary t Cellulitis of left lower extremity Has remained afebrile continue IV Vanco and Rocephin for 1 additional day, awaiting wound care consultation. Culture data is pending.  Sacral decubitus ulcer: Consult WOC.  Chronic kidney disease stage III: Resume Lasix and Aldactone. With baseline creatinine of around 2. At baseline continue to hold metolazone.  Chronic diastolic and systolic heart failure: Resume Coreg and BiDil.  A. fib with a chads score greater than 4: Continue Coreg and Coumadin check INR daily. NR is subtherapeutic.   Diabetes mellitus, insulin dependent (IDDM), uncontrolled ([place]) Start on sliding scale insulin continue Levemir twice a day. ABGs before meals and at bedtime, check an A1c.    Obesity, Class III, BMI 40-49.9 (morbid obesity) (HCC) Counseling  Essential hypertension Pressure seems to be well-controlled.  Hypokalemia: Replete orally, recheck in the morning.   DVT prophylaxis: coumadin Family Communication:none Disposition Plan/Barrier to D/C: home in a.m. Code Status:     Code Status Orders  (From admission, onward)        Start     Ordered   02/13/18 1605  Full code  Continuous     02/13/18 1607    Code Status History    Date Active Date Inactive Code Status Order ID Comments User Context   01/27/2016 0143 02/06/2016 1804 Full Code [id number]  Toy [name], MD Inpatient   03/17/2014 1347 03/17/2014 2219 Full Code [id number]  Charolette Forward, MD  Inpatient   11/12/2013 0158 11/18/2013 1830 Full Code [id number]  [name], DO ED   08/11/2013 1818 10/15/2013 2042 Full Code [id number]  [name], MD ED   08/02/2013 1348 08/05/2013 2027 DNR [id number]  Rama, Venetia Maxon, MD Inpatient   08/01/2013 2240 08/02/2013 1348 Full Code [id number]  [name], MD Inpatient   08/24/2012 1423 08/27/2012 1610 Full Code [id number]  [name], RN Inpatient   06/28/2012 0910 06/29/2012 1911 Full Code [id number]  [name], RN Inpatient    Advance Directive Documentation     Most Recent Value  Type of Advance Directive  Healthcare Power of Attorney  Pre-existing out of facility DNR order (yellow form or pink MOST form)  -  "MOST" Form in Place?  -        IV Access:    Peripheral IV   Procedures and diagnostic studies:   Dg Chest 1 View  Result Date: 02/13/2018 CLINICAL DATA:  67 year old male with with hyperglycemia, decreased p.o. intake. Recent surgery for debridement of a foot/heel wound. EXAM: CHEST  1 VIEW COMPARISON:  Portable chest radiographs 01/26/2016 and earlier. FINDINGS: Semi upright AP and lateral views of the chest. Chronic cardiomegaly and left chest cardiac pacemaker. Lordotic AP view today. Stable lung volumes. No pneumothorax, pulmonary edema, pleural effusion or confluent pulmonary opacity identified. No acute osseous abnormality identified. Paucity bowel gas in the upper abdomen. IMPRESSION: Stable cardiomegaly. No acute cardiopulmonary abnormality. Electronically Signed   By: Genevie Ann M.D.   On: 02/13/2018 16:55   Ct  Pelvis Wo Contrast  Result Date: 02/13/2018 CLINICAL DATA:  Soft tissue swelling. Concern for sacral decubitus ulcer or abscess. EXAM: CT PELVIS WITHOUT CONTRAST TECHNIQUE: Multidetector CT imaging of the pelvis was performed following the standard protocol without intravenous contrast. COMPARISON:  CT abdomen pelvis 11/10/2017 FINDINGS: Urinary Tract:  No abnormality visualized.  Bowel:  Unremarkable visualized pelvic bowel loops. Vascular/Lymphatic: Atherosclerotic calcification of the aorta and iliac arteries. Reproductive:  No mass or other significant abnormality Other:  None. Musculoskeletal: No suspicious bone lesions identified. There is no sacral decubitus ulcer or subcutaneous lesion. No abscess or fluid collection. IMPRESSION: No sacral decubitus ulcer or abscess. Aortic Atherosclerosis (ICD10-I70.0). Electronically Signed   By: Ulyses Jarred M.D.   On: 02/13/2018 17:14   Dg Foot Complete Left  Result Date: 02/13/2018 CLINICAL DATA:  Heel ulcer and foot pain EXAM: LEFT FOOT - COMPLETE 3+ VIEW COMPARISON:  Left foot radiograph 02/11/2018 FINDINGS: Diffuse osteopenia. No acute fracture or dislocation. Previously seen heel ulcer is not visible due to positioning. No visible osteolysis. IMPRESSION: Allowing for differences in positioning, unchanged radiographs of the left foot without osseous acute abnormality. Electronically Signed   By: Ulyses Jarred M.D.   On: 02/13/2018 16:55   Ct Extremity Lower Left Wo Contrast  Result Date: 02/13/2018 CLINICAL DATA:  LEFT heel ulcer, sacral ulcer, atrial fibrillation, CHF, diabetes mellitus, fever, CHF, former smoker EXAM: CT OF THE LOWER LEFT EXTREMITY WITHOUT CONTRAST TECHNIQUE: Multidetector CT imaging of the lower left extremity was performed according to the standard protocol. Sagittal and coronal MPR images reconstructed from axial data set. COMPARISON:  CT pelvis 02/13/2018, FINDINGS: Bones/Joint/Cartilage Osseous demineralization. Visualized pelvis intact. Degenerative changes of LEFT hip joint with mild joint space narrowing and spur formation. Degenerative changes are also present at the LEFT knee joint with joint space narrowing, minimal spurring, chondrocalcinosis, and associated knee joint effusion. No fracture or dislocation. No bone destruction identified to suggest osteomyelitis. Mild scattered degenerative changes at  intertarsal joints and at ankle joint. Ligaments Suboptimally assessed by CT. Muscles and Tendons Tendons suboptimally assessed by CT. Fatty atrophy of the medial LEFT gastrocnemius muscle. Muscular planes appear atrophic but otherwise grossly normal appearance. Soft tissues Skin thickening and subcutaneous edema at the posteromedial LEFT heel region likely representing patient's heel ulcer. Soft tissue swelling extends into the medial soft tissues at the plantar arch. LEFT knee joint effusion. No definite hip joint effusion. Scattered atherosclerotic calcifications. No abnormal fluid collections to suggest abscess. Multiple normal to upper normal sized LEFT inguinal lymph nodes. LEFT inguinal hernia containing fat. Intrapelvic structures otherwise unremarkable. IMPRESSION: No CT evidence of bone destruction adjacent to the posteromedial heel ulcer to suggest osteomyelitis. Scattered degenerative changes at LEFT hip, knee, and ankle/foot with associated LEFT knee joint effusion. Fatty atrophy of medial LEFT gastrocnemius muscle. Small LEFT inguinal hernia containing fat. Electronically Signed   By: Lavonia Dana M.D.   On: 02/13/2018 17:45     Medical Consultants:    None.  Anti-Infectives:   IV vancomycin and Rocephin.  Subjective:    Kurt Cross no new complaints.  Objective:    Vitals:   02/14/18 1401 02/14/18 1835 02/14/18 2117 02/15/18 0556  BP: 128/67 140/72 127/65 130/72  Pulse: 70 69 70 70  Resp: 16 19 18 18   Temp: 97.7 F (36.5 C) 98.3 F (36.8 C) 98 F (36.7 C) 98.3 F (36.8 C)  TempSrc: Oral Oral Oral Oral  SpO2: 94% 90% 98% 93%  Weight:  Height:        Intake/Output Summary (Last 24 hours) at 02/15/2018 0959 Last data filed at 02/15/2018 0626 Gross per 24 hour  Intake 521.25 ml  Output 1750 ml  Net -1228.75 ml   Filed Weights   02/13/18 2023 02/13/18 2211  Weight: 85.7 kg (189 lb) 128.4 kg (283 lb)    Exam: General exam: In no acute  distress. Respiratory system: Good air movement and clear to auscultation. Cardiovascular system: S1 & S2 heard, RRR.  Gastrointestinal system: Abdomen is nondistended, soft and nontender.  Central nervous system: Alert and oriented. No focal neurological deficits. Extremities: Lower extremity edema Skin: Has a heel ulcer that is purulent, and a sacral decubitus ulcer stage III looks clean    Data Reviewed:    Labs: Basic Metabolic Panel: Recent Labs  Lab 02/13/18 1131 02/13/18 1454 02/14/18 0510 02/15/18 0419  NA 133*  --  136 134*  K 2.6*  --  2.3* 2.8*  CL 86*  --  88* 90*  CO2 31  --  32 29  GLUCOSE 391*  --  257* 264*  BUN 54*  --  50* 39*  CREATININE 2.27*  --  2.23* 1.76*  CALCIUM 8.6*  --  8.6* 8.3*  MG  --  1.5*  --   --    GFR Estimated Creatinine Clearance: 58.7 mL/min (A) (by C-G formula based on SCr of 1.76 mg/dL (H)). Liver Function Tests: Recent Labs  Lab 02/14/18 0510  AST 17  ALT 16*  ALKPHOS 24*  BILITOT 0.6  PROT 7.1  ALBUMIN 2.5*   No results for input(s): LIPASE, AMYLASE in the last 168 hours. No results for input(s): AMMONIA in the last 168 hours. Coagulation profile Recent Labs  Lab 02/13/18 2119 02/14/18 0511 02/15/18 0419  INR 1.49 1.44 1.66    CBC: Recent Labs  Lab 02/13/18 1131 02/14/18 0510  WBC 10.1 11.3*  HGB 14.9 14.1  HCT 44.3 42.7  MCV 92.7 92.2  PLT 240 257   Cardiac Enzymes: No results for input(s): CKTOTAL, CKMB, CKMBINDEX, TROPONINI in the last 168 hours. BNP (last 3 results) No results for input(s): PROBNP in the last 8760 hours. CBG: Recent Labs  Lab 02/14/18 0803 02/14/18 1213 02/14/18 1710 02/14/18 2123 02/15/18 0830  GLUCAP 293* 275* 252* 236* 320*   D-Dimer: No results for input(s): DDIMER in the last 72 hours. Hgb A1c: Recent Labs    02/14/18 0846  HGBA1C 10.0*   Lipid Profile: No results for input(s): CHOL, HDL, LDLCALC, TRIG, CHOLHDL, LDLDIRECT in the last 72 hours. Thyroid function  studies: No results for input(s): TSH, T4TOTAL, T3FREE, THYROIDAB in the last 72 hours.  Invalid input(s): FREET3 Anemia work up: No results for input(s): VITAMINB12, FOLATE, FERRITIN, TIBC, IRON, RETICCTPCT in the last 72 hours. Sepsis Labs: Recent Labs  Lab 02/13/18 1131 02/13/18 1508 02/14/18 0510  WBC 10.1  --  11.3*  LATICACIDVEN  --  2.17*  --    Microbiology Recent Results (from the past 240 hour(s))  Aerobic Culture (superficial specimen)     Status: None   Collection Time: 02/11/18  3:20 PM  Result Value Ref Range Status   Specimen Description   Final    WOUND LEFT CALCANEUS Performed at [facility], [place] [address] [place],  [address]    Special Requests   Final    NONE Performed at [facility], [place] [address], [place],  [address]    Gram Stain  Final    RARE WBC PRESENT, PREDOMINANTLY PMN RARE GRAM NEGATIVE RODS    Culture   Final    MODERATE METHICILLIN RESISTANT STAPHYLOCOCCUS AUREUS MODERATE PROTEUS MIRABILIS    Report Status 02/15/2018 FINAL  Final   Organism ID, Bacteria PROTEUS MIRABILIS  Final   Organism ID, Bacteria METHICILLIN RESISTANT STAPHYLOCOCCUS AUREUS  Final      Susceptibility   Methicillin resistant staphylococcus aureus - MIC*    CIPROFLOXACIN >=8 RESISTANT Resistant     ERYTHROMYCIN >=8 RESISTANT Resistant     GENTAMICIN <=0.5 SENSITIVE Sensitive     OXACILLIN >=4 RESISTANT Resistant     TETRACYCLINE <=1 SENSITIVE Sensitive     VANCOMYCIN 1 SENSITIVE Sensitive     TRIMETH/SULFA >=320 RESISTANT Resistant     CLINDAMYCIN <=0.25 SENSITIVE Sensitive     RIFAMPIN <=0.5 SENSITIVE Sensitive     Inducible Clindamycin NEGATIVE Sensitive     * MODERATE METHICILLIN RESISTANT STAPHYLOCOCCUS AUREUS   Proteus mirabilis - MIC*    AMPICILLIN <=2 SENSITIVE Sensitive     CEFAZOLIN <=4 SENSITIVE Sensitive     CEFEPIME <=1 SENSITIVE Sensitive     CEFTAZIDIME <=1 SENSITIVE Sensitive      CEFTRIAXONE <=1 SENSITIVE Sensitive     CIPROFLOXACIN >=4 RESISTANT Resistant     GENTAMICIN <=1 SENSITIVE Sensitive     IMIPENEM 4 SENSITIVE Sensitive     TRIMETH/SULFA <=20 SENSITIVE Sensitive     AMPICILLIN/SULBACTAM <=2 SENSITIVE Sensitive     PIP/TAZO <=4 SENSITIVE Sensitive     * MODERATE PROTEUS MIRABILIS  Culture, blood (routine x 2)     Status: None (Preliminary result)   Collection Time: 02/13/18  2:40 PM  Result Value Ref Range Status   Specimen Description   Final    BLOOD RIGHT FOREARM Performed at [facility], [place] [address], [place], [place] [address]    Special Requests   Final    BOTTLES DRAWN AEROBIC ONLY Blood Culture results may not be optimal due to an inadequate volume of blood received in culture bottles Performed at [place] [address] [place], [place] [address]    Culture   Final    NO GROWTH < 24 HOURS Performed at [facility] [address] [place], [place] [address]    Report Status PENDING  Incomplete  Culture, blood (routine x 2)     Status: None (Preliminary result)   Collection Time: 02/13/18  2:54 PM  Result Value Ref Range Status   Specimen Description   Final    BLOOD LEFT ANTECUBITAL Performed at [place] [address] [place], [place] [address]    Special Requests   Final    BOTTLES DRAWN AEROBIC AND ANAEROBIC Blood Culture adequate volume Performed at [place] [address], [place], [place] [address]    Culture   Final    NO GROWTH < 24 HOURS Performed at [facility] [address], [place], [place] [address]    Report Status PENDING  Incomplete     Medications:   . carvedilol  25 mg Oral BID WC  . gabapentin  300 mg Oral q morning - 10a  . insulin aspart  0-5 Units Subcutaneous QHS  . insulin aspart  0-9 Units Subcutaneous TID WC  . insulin aspart  3 Units Subcutaneous TID WC  . insulin detemir  4 Units  Subcutaneous BID  . isosorbide-hydrALAZINE  1 tablet Oral BID  . potassium  chloride  40 mEq Oral BID  . Warfarin - Pharmacist Dosing Inpatient   Does not apply q1800   Continuous Infusions: . cefTRIAXone (ROCEPHIN)  IV Stopped (02/14/18 2230)  . vancomycin 1,000 mg (02/14/18 2313)      LOS: 2 days   [facility] Pager [phone]  *Please refer to [email or web address], password [email or web address] to get updated schedule on who will round on this patient, as hospitalists switch teams weekly. If 7PM-7AM, please contact night-coverage at [email or web address], password [email or web address] for any overnight needs.  02/15/2018, 9:59 AM

## 2018-02-15 NOTE — Progress Notes (Signed)
ANTICOAGULATION CONSULT NOTE - Follow Up  Pharmacy Consult for Warfarin Indication: atrial fibrillation/flutter  No Known Allergies  Patient Measurements: Height: 6' (182.9 cm) Weight: 283 lb (128.4 kg) IBW/kg (Calculated) : 77.6   Vital Signs: Temp: 98.3 F (36.8 C) (05/12 0556) Temp Source: Oral (05/12 0556) BP: 130/72 (05/12 0556) Pulse Rate: 70 (05/12 0556)  Labs: Recent Labs    02/13/18 1131 02/13/18 2119 02/14/18 0510 02/14/18 0511 02/15/18 0419  HGB 14.9  --  14.1  --   --   HCT 44.3  --  42.7  --   --   PLT 240  --  257  --   --   LABPROT  --  17.9*  --  17.4* 19.5*  INR  --  1.49  --  1.44 1.66  CREATININE 2.27*  --  2.23*  --  1.76*    Estimated Creatinine Clearance: 58.7 mL/min (A) (by C-G formula based on SCr of 1.76 mg/dL (H)).  Assessment: 28 y/oM on chronic warfarin therapy for a-fib/a-flutter admitted to [facility] on 02/13/2018 for foot wound. Pharmacy consulted to manage warfarin dosing while patient in the hospital. Baseline PT/INR not ordered. PTA dose reported as warfarin 7.5mg  PO daily with last dose 02/12/2018 at 0800. CBC reveals H/H, Pltc WNL.   Today, 02/15/18  INR 1.66, subtherapeutic but increasing with boosted doses given on 5/10 and 5/11  Stable CBC  No reported bleeding  Goal of Therapy:  INR 2-3 Monitor platelets by anticoagulation protocol: Yes   Plan:  1) Repeat warfarin 10mg  today at 1800 2) Daily INR  Gretta Arab PharmD, BCPS Pager [phone] 02/15/2018 10:49 AM

## 2018-02-16 LAB — GLUCOSE, CAPILLARY
GLUCOSE-CAPILLARY: 271 mg/dL — AB (ref 65–99)
Glucose-Capillary: 255 mg/dL — ABNORMAL HIGH (ref 65–99)
Glucose-Capillary: 290 mg/dL — ABNORMAL HIGH (ref 65–99)
Glucose-Capillary: 269 mg/dL — ABNORMAL HIGH (ref 65–99)

## 2018-02-16 LAB — BASIC METABOLIC PANEL
Anion gap: 15 (ref 5–15)
BUN: 36 mg/dL — ABNORMAL HIGH (ref 6–20)
CALCIUM: 8.3 mg/dL — AB (ref 8.9–10.3)
CO2: 29 mmol/L (ref 22–32)
CREATININE: 2.02 mg/dL — AB (ref 0.61–1.24)
Chloride: 92 mmol/L — ABNORMAL LOW (ref 101–111)
GFR calc Af Amer: 38 mL/min — ABNORMAL LOW (ref >60)
GFR, EST NON AFRICAN AMERICAN: 33 mL/min — AB (ref >60)
GLUCOSE: 257 mg/dL — AB (ref 65–99)
Potassium: 2.6 mmol/L — CL (ref 3.5–5.1)
Sodium: 136 mmol/L (ref 135–145)

## 2018-02-16 LAB — PROTIME-INR
INR: 2.31
PROTHROMBIN TIME: 25.2 s — AB (ref 11.4–15.2)

## 2018-02-16 MED ORDER — COLLAGENASE 250 UNIT/GM EX OINT
TOPICAL_OINTMENT | Freq: Every day | CUTANEOUS | Status: DC
  Administered 2018-02-16 – 2018-02-18 (×3): via TOPICAL
  Filled 2018-02-16: qty 90

## 2018-02-16 MED ORDER — MAGNESIUM OXIDE 400 (241.3 MG) MG PO TABS
400.0000 mg | ORAL_TABLET | Freq: Two times a day (BID) | ORAL | Status: AC
  Administered 2018-02-16 (×2): 400 mg via ORAL
  Filled 2018-02-16 (×2): qty 1

## 2018-02-16 MED ORDER — SODIUM CHLORIDE 0.9 % IV SOLN
2.0000 g | INTRAVENOUS | Status: DC
  Administered 2018-02-16 – 2018-02-17 (×2): 2 g via INTRAVENOUS
  Filled 2018-02-16: qty 2
  Filled 2018-02-16: qty 20
  Filled 2018-02-16: qty 2

## 2018-02-16 MED ORDER — POTASSIUM CHLORIDE CRYS ER 20 MEQ PO TBCR
40.0000 meq | EXTENDED_RELEASE_TABLET | Freq: Two times a day (BID) | ORAL | Status: AC
  Administered 2018-02-16 – 2018-02-17 (×2): 40 meq via ORAL
  Filled 2018-02-16 (×2): qty 2

## 2018-02-16 MED ORDER — WARFARIN SODIUM 2.5 MG PO TABS
2.5000 mg | ORAL_TABLET | Freq: Once | ORAL | Status: AC
  Administered 2018-02-16: 2.5 mg via ORAL
  Filled 2018-02-16: qty 1

## 2018-02-16 MED ORDER — POTASSIUM CHLORIDE 10 MEQ/100ML IV SOLN
10.0000 meq | INTRAVENOUS | Status: AC
  Administered 2018-02-16 (×4): 10 meq via INTRAVENOUS
  Filled 2018-02-16 (×4): qty 100

## 2018-02-16 NOTE — Evaluation (Signed)
Physical Therapy Evaluation Patient Details Name: Kurt Cross MRN: [id number] DOB: 18-Oct-1950 Today's Date: 02/16/2018   History of Present Illness  67 y.o. male with past medical history of diabetes mellitus type 2, essential hypertension and  systolic and diastolic heart failure, status post pacemaker with A. fib and admitted for Severe sepsis secondary to Cellulitis of left lower extremity (also with sacral pressure injury)  Clinical Impression  Pt admitted with above diagnosis. Pt currently with functional limitations due to the deficits listed below (see PT Problem List).  Pt will benefit from skilled PT to increase their independence and safety with mobility to allow discharge to the venue listed below.  Pt reports he was discharged home from SNF approximately 3 weeks ago.  He is typically able to transfer to w/c at home with his sliding board.  Attempted to assist pt with drop arm recliner and provided transfer board however pt sliding forward and required increased assist to safely reposition in recliner (see mobility section below).  If pt willing, may benefit from SNF however he did not appear pleased with recent stay so if home then HHPT.     Follow Up Recommendations Supervision/Assistance - 24 hour;Home health PT(has been to SNF, does not appear to wish to return)    Equipment Recommendations  None recommended by PT    Recommendations for Other Services       Precautions / Restrictions Precautions Precautions: Fall Precaution Comments: L heel pressure injury Required Braces or Orthoses: Other Brace/Splint Other Brace/Splint: post op shoe for L foot Restrictions Other Position/Activity Restrictions: encouraged pt to not place weight through heels if able with transfers      Mobility  Bed Mobility Overal bed mobility: Needs Assistance Bed Mobility: Supine to Sit     Supine to sit: HOB elevated;Min guard     General bed mobility comments: provided a hand for pt to  pull himself upright, has hospital bed at home  Transfers Overall transfer level: Needs assistance   Transfers: Lateral/Scoot Transfers          Lateral/Scoot Transfers: Total assist;With slide board;+2 physical assistance General transfer comment: pt reports using sliding board at home, assisted with placing sliding board per pt request, blocked pt's knee and he was able to slide about half way when he started to slide forward so therapist pulled LEs upward and pt cued to lay back to prevent sliding to floor, emergency staff button utilized and staff immediately into room to assist with safely positioning pt in recliner comfortably, pt denies hitting the floor and denies any pain, pt repositioned in recliner to comfort and LEs placed on pillows.  Ambulation/Gait                Stairs            Wheelchair Mobility    Modified Rankin (Stroke Patients Only)       Balance Overall balance assessment: Needs assistance Sitting-balance support: No upper extremity supported;Feet supported Sitting balance-Leahy Scale: Fair                                       Pertinent Vitals/Pain Pain Assessment: 0-10 Pain Score: 8  Pain Location: LEs Pain Intervention(s): Patient requesting pain meds-RN notified;Monitored during session;Limited activity within patient's tolerance;Repositioned    Home Living Family/patient expects to be discharged to:: Private residence Living Arrangements: Spouse/significant other   Type of Home:  House Home Access: Ramped entrance     Home Layout: One level Home Equipment: Wheelchair - manual;Hospital bed;Walker - 4 wheels;Bedside commode      Prior Function Level of Independence: Needs assistance   Gait / Transfers Assistance Needed: uses transfer board for transferring to/from w/c, reports he is nonambulatory and doesn't stand  ADL's / Homemaking Assistance Needed: reports caregiver assists with bathing a few times per  week, he is typically able to dress himself        Hand Dominance        Extremity/Trunk Assessment        Lower Extremity Assessment Lower Extremity Assessment: Generalized weakness;LLE deficits/detail LLE Deficits / Details: L heel pressure injury, grossly observed 2+/5 strength, pt reports being non weight bearing for a few months       Communication   Communication: No difficulties  Cognition Arousal/Alertness: Awake/alert Behavior During Therapy: WFL for tasks assessed/performed Overall Cognitive Status: Within Functional Limits for tasks assessed                                        General Comments      Exercises     Assessment/Plan    PT Assessment Patient needs continued PT services  PT Problem List Decreased strength;Decreased mobility;Decreased knowledge of use of DME;Decreased activity tolerance;Decreased skin integrity       PT Treatment Interventions DME instruction;Therapeutic exercise;Therapeutic activities;Patient/family education;Functional mobility training;Balance training;Wheelchair mobility training    PT Goals (Current goals can be found in the Care Plan section)  Acute Rehab PT Goals PT Goal Formulation: With patient Time For Goal Achievement: 03/02/18 Potential to Achieve Goals: Good    Frequency Min 2X/week   Barriers to discharge        Co-evaluation               AM-PAC PT "6 Clicks" Daily Activity  Outcome Measure Difficulty turning over in bed (including adjusting bedclothes, sheets and blankets)?: Unable Difficulty moving from lying on back to sitting on the side of the bed? : Unable Difficulty sitting down on and standing up from a chair with arms (e.g., wheelchair, bedside commode, etc,.)?: Unable Help needed moving to and from a bed to chair (including a wheelchair)?: Total Help needed walking in hospital room?: Total Help needed climbing 3-5 steps with a railing? : Total 6 Click Score: 6     End of Session   Activity Tolerance: Patient limited by fatigue Patient left: in chair;with call bell/phone within reach;with nursing/sitter in room Nurse Communication: Mobility status;Need for lift equipment PT Visit Diagnosis: Other abnormalities of gait and mobility (R26.89)    Time: 6712-4580 PT Time Calculation (min) (ACUTE ONLY): 30 min   Charges:   PT Evaluation $PT Eval Moderate Complexity: 1 Mod     PT G Codes:        Carmelia Bake, PT, DPT 02/16/2018 Pager: [phone]  York Ram E 02/16/2018, 3:22 PM

## 2018-02-16 NOTE — Progress Notes (Signed)
ANTICOAGULATION CONSULT NOTE - Follow Up  Pharmacy Consult for Warfarin Indication: hx atrial fibrillation/flutter  No Known Allergies  Patient Measurements: Height: 6' (182.9 cm) Weight: 283 lb (128.4 kg) IBW/kg (Calculated) : 77.6   Vital Signs: Temp: 98.9 F (37.2 C) (05/13 0550) Temp Source: Oral (05/13 0550) BP: 113/68 (05/13 0810) Pulse Rate: 70 (05/13 0810)  Labs: Recent Labs    02/13/18 1131  02/14/18 0510 02/14/18 0511 02/15/18 0419 02/16/18 0438  HGB 14.9  --  14.1  --   --   --   HCT 44.3  --  42.7  --   --   --   PLT 240  --  257  --   --   --   LABPROT  --    < >  --  17.4* 19.5* 25.2*  INR  --    < >  --  1.44 1.66 2.31  CREATININE 2.27*  --  2.23*  --  1.76* 2.02*   < > = values in this interval not displayed.    Estimated Creatinine Clearance: 51.2 mL/min (A) (by C-G formula based on SCr of 2.02 mg/dL (H)).  Assessment: Patient's a 67 y.o M on chronic warfarin therapy for a-fib/a-flutter admitted to [facility] on 02/13/2018 for foot wound. Pharmacy consulted to manage warfarin dosing while patient in the hospital.   - home warfarin regimen: 7.5 mg daily  Today, 02/16/18  INR is therapeutic but increased noticeably from 1.66 to 2.31 after three doses of 10 mg   Last cbc on 5/11 stable  No bleeding documented  Being on abx can make patient more sensitive to warfarin (on vancomycin and ceftriaxone)  Goal of Therapy:  INR 2-3 Monitor platelets by anticoagulation protocol: Yes   Plan:  - Reduce warfarin 2.5 mg x 1 today.  If to discharge patient today, recom to give 2.5mg  dose prior to discharge, then resume home dose 7.5 mg daily on 5/14. Recom to recheck INR on 5/14 if possible. - Daily INR - monitor for s/s bleeding  Dia Sitter, PharmD, BCPS 02/16/2018 10:57 AM

## 2018-02-16 NOTE — Progress Notes (Signed)
PT Cancellation Note  Patient Details Name: Kurt Cross MRN: [id number] DOB: Mar 06, 1951   Cancelled Treatment:    Reason Eval/Treat Not Completed: Other (comment) Pt reports special shoe for L foot since s/p heel debridement.  Pt does not have with him currently.  Discussed with RN (checking with MD and aware ortho tech would likely need to be called once shoe order placed). Will check back as schedule permits to assist with mobility.   Kaytlen Lightsey,KATHrine E 02/16/2018, 10:57 AM Carmelia Bake, PT, DPT 02/16/2018 Pager: [phone]

## 2018-02-16 NOTE — Progress Notes (Signed)
[facility] PROGRESS NOTE    Progress Note  Kurt Cross  CWC:[id number] DOB: 10-15-1950 DOA: 02/13/2018 PCP: [name], Kurt     Brief Narrative:   [name] is an 67 y.o. male *past medical history of diabetes mellitus type Cross, Kurt Cross, Kurt Cross. fib with Cross chads score greater than 4 on Coumadin comes into the emergency room with left heel sacral ulcer  Assessment/Plan:   Severe sepsis secondary t Cellulitis of left lower extremity Has remained afebrile continue IV Vanco and Rocephin for 1 additional day, awaiting wound care consultation. Culture data is negative  Sacral decubitus ulcer: Re-consult WOC  Chronic kidney disease stage III: Resume Lasix and Aldactone. With baseline creatinine of around Cross. At baseline continue to hold metolazone.  Chronic diastolic and systolic heart Cross: Resume Coreg and BiDil.  Cross. fib with Cross chads score greater than 4: Continue Coreg and Coumadin check INR daily. INR is subtherapeutic.  Diabetes mellitus, insulin dependent (IDDM), uncontrolled ([place]) Start on sliding scale insulin continue Levemir twice Cross day. ABGs before meals and at bedtime, check an A1c.    Obesity, Class III, BMI 40-49.9 (morbid obesity) (HCC) Counseling  Kurt hypertension Pressure seems to be well-controlled.  Hypokalemia: Hard to replete, check Cross mag and replete. Replete K orally recheck in am   DVT prophylaxis: coumadin Family Communication:none Disposition Plan/Barrier to D/C: home in Cross.m. Code Kurt:     Code Kurt Orders  (From admission, onward)        Start     Ordered   02/13/18 1605  Full code  Continuous     02/13/18 1607    Code Kurt History    Date Active Date Inactive Code Kurt Order ID Comments User Context   01/27/2016 0143 5/Cross/2017 1804 Full Code [id number]  Toy [name], Kurt Inpatient   03/17/2014 1347 03/17/2014 2219 Full Code  [id number]  Charolette Forward, Kurt Inpatient   Cross/03/2014 0158 Cross/09/2014 1830 Full Code [id number]  [name], DO ED   08/11/2013 1818 10/15/2013 2042 Full Code [id number]  [name], Kurt ED   08/02/2013 1348 08/05/2013 2027 DNR [id number]  Rama, Venetia Maxon, Kurt Inpatient   08/01/2013 2240 08/02/2013 1348 Full Code [id number]  [name], Kurt Inpatient   08/24/2012 1423 08/27/2012 1610 Full Code [id number]  [name], RN Inpatient   06/28/2012 0910 06/29/2012 1911 Full Code [id number]  [name], RN Inpatient    Advance Directive Documentation     Most Recent Value  Type of Advance Directive  Healthcare Power of Attorney  Pre-existing out of facility DNR order (yellow form or pink MOST form)  -  "MOST" Form in Place?  -        IV Access:    Peripheral IV   Procedures and diagnostic studies:   No results found.   Medical Consultants:    None.  Anti-Infectives:   IV vancomycin and Rocephin.  Subjective:    Kurt Cross no new complaints.  Objective:    Vitals:   02/15/18 1359 02/15/18 2127 02/16/18 0550 02/16/18 0810  BP: 121/69 139/71 122/66 113/68  Pulse: 69 69 70 70  Resp: 18 18 13 16   Temp: 98.7 F (37.1 C) 98.7 F (37.1 C) 98.9 F (37.Cross C)   TempSrc: Oral Oral Oral   SpO2: 93% 93% 100%   Weight:      Height:  Intake/Output Summary (Last 24 hours) at 02/16/2018 1114 Last data filed at 02/16/2018 1000 Gross per 24 hour  Intake 500 ml  Output 1350 ml  Net -850 ml   Filed Weights   02/13/18 2023 02/13/18 2211  Weight: 85.7 kg (189 lb) 128.4 kg (283 lb)    Exam: General exam: In no acute distress. Respiratory system: Good air movement and clear to auscultation. Cardiovascular system: S1 & S2 heard, RRR.  Gastrointestinal system: Abdomen is nondistended, soft and nontender.  Central nervous system: Alert and oriented. No focal neurological deficits. Extremities: Lower extremity edema Skin: Has Cross heel ulcer  that is purulent, and Cross sacral decubitus ulcer stage III looks clean    Data Reviewed:    Labs: Basic Metabolic Panel: Recent Labs  Lab 02/13/18 1131 02/13/18 1454 02/14/18 0510 02/15/18 0419 02/16/18 0438  NA 133*  --  136 134* 136  K Cross.6*  --  Cross.3* Cross.8* Cross.6*  CL 86*  --  88* 90* 92*  CO2 31  --  32 29 29  GLUCOSE 391*  --  257* 264* 257*  BUN 54*  --  50* 39* 36*  CREATININE Cross.27*  --  Cross.23* 1.76* Cross.02*  CALCIUM 8.6*  --  8.6* 8.3* 8.3*  MG  --  1.5*  --   --   --    GFR Estimated Creatinine Clearance: 51.Cross mL/min (Cross) (by C-G formula based on SCr of Cross.02 mg/dL (H)). Liver Function Tests: Recent Labs  Lab 02/14/18 0510  AST 17  ALT 16*  ALKPHOS 24*  BILITOT 0.6  PROT 7.1  ALBUMIN Cross.5*   No results for input(s): LIPASE, AMYLASE in the last 168 hours. No results for input(s): AMMONIA in the last 168 hours. Coagulation profile Recent Labs  Lab 02/13/18 2119 02/14/18 0511 02/15/18 0419 02/16/18 0438  INR 1.49 1.44 1.66 Cross.31    CBC: Recent Labs  Lab 02/13/18 1131 02/14/18 0510  WBC 10.1 11.3*  HGB 14.9 14.1  HCT 44.3 42.7  MCV 92.7 92.Cross  PLT 240 257   Cardiac Enzymes: No results for input(s): CKTOTAL, CKMB, CKMBINDEX, TROPONINI in the last 168 hours. BNP (last 3 results) No results for input(s): PROBNP in the last 8760 hours. CBG: Recent Labs  Lab 02/15/18 0830 02/15/18 1240 02/15/18 1724 02/15/18 2126 02/16/18 0757  GLUCAP 320* 222* 218* 179* 255*   D-Dimer: No results for input(s): DDIMER in the last 72 hours. Hgb A1c: Recent Labs    02/14/18 0846  HGBA1C 10.0*   Lipid Profile: No results for input(s): CHOL, HDL, LDLCALC, TRIG, CHOLHDL, LDLDIRECT in the last 72 hours. Thyroid function studies: No results for input(s): TSH, T4TOTAL, T3FREE, THYROIDAB in the last 72 hours.  Invalid input(s): FREET3 Anemia work up: No results for input(s): VITAMINB12, FOLATE, FERRITIN, TIBC, IRON, RETICCTPCT in the last 72 hours. Sepsis  Labs: Recent Labs  Lab 02/13/18 1131 02/13/18 1508 02/14/18 0510  WBC 10.1  --  11.3*  LATICACIDVEN  --  Cross.17*  --    Microbiology Recent Results (from the past 240 hour(s))  Aerobic Culture (superficial specimen)     Kurt: None   Collection Time: 02/11/18  3:20 PM  Result Value Ref Range Kurt   Specimen Description   Final    WOUND LEFT CALCANEUS Performed at [facility], [place] [address], [place], [place] [address]    Special Requests   Final    NONE Performed at [facility], [address] [place],  [place] [address]    Gram Stain   Final    RARE WBC PRESENT, PREDOMINANTLY PMN RARE GRAM NEGATIVE RODS    Culture   Final    MODERATE METHICILLIN RESISTANT STAPHYLOCOCCUS AUREUS MODERATE PROTEUS MIRABILIS    Report Kurt 02/15/2018 FINAL  Final   Organism ID, Bacteria PROTEUS MIRABILIS  Final   Organism ID, Bacteria METHICILLIN RESISTANT STAPHYLOCOCCUS AUREUS  Final      Susceptibility   Methicillin resistant staphylococcus aureus - MIC*    CIPROFLOXACIN >=8 RESISTANT Resistant     ERYTHROMYCIN >=8 RESISTANT Resistant     GENTAMICIN <=0.5 SENSITIVE Sensitive     OXACILLIN >=4 RESISTANT Resistant     TETRACYCLINE <=1 SENSITIVE Sensitive     VANCOMYCIN 1 SENSITIVE Sensitive     TRIMETH/SULFA >=320 RESISTANT Resistant     CLINDAMYCIN <=0.25 SENSITIVE Sensitive     RIFAMPIN <=0.5 SENSITIVE Sensitive     Inducible Clindamycin NEGATIVE Sensitive     * MODERATE METHICILLIN RESISTANT STAPHYLOCOCCUS AUREUS   Proteus mirabilis - MIC*    AMPICILLIN <=Cross SENSITIVE Sensitive     CEFAZOLIN <=4 SENSITIVE Sensitive     CEFEPIME <=1 SENSITIVE Sensitive     CEFTAZIDIME <=1 SENSITIVE Sensitive     CEFTRIAXONE <=1 SENSITIVE Sensitive     CIPROFLOXACIN >=4 RESISTANT Resistant     GENTAMICIN <=1 SENSITIVE Sensitive     IMIPENEM 4 SENSITIVE Sensitive     TRIMETH/SULFA <=20 SENSITIVE Sensitive     AMPICILLIN/SULBACTAM <=Cross SENSITIVE Sensitive      PIP/TAZO <=4 SENSITIVE Sensitive     * MODERATE PROTEUS MIRABILIS  Culture, blood (routine x Cross)     Kurt: None (Preliminary result)   Collection Time: 02/13/18  Cross:40 PM  Result Value Ref Range Kurt   Specimen Description   Final    BLOOD RIGHT FOREARM Performed at [facility], [place] [address], [place], [place] [address]    Special Requests   Final    BOTTLES DRAWN AEROBIC ONLY Blood Culture results may not be optimal due to an inadequate volume of blood received in culture bottles Performed at [facility] [address], [place], [place] [address]    Culture   Final    NO GROWTH Cross DAYS Performed at [facility] [address], [place], [place] [address]    Report Kurt PENDING  Incomplete  Culture, blood (routine x Cross)     Kurt: None (Preliminary result)   Collection Time: 02/13/18  Cross:54 PM  Result Value Ref Range Kurt   Specimen Description   Final    BLOOD LEFT ANTECUBITAL Performed at [facility] [address] [place], [place] [address]    Special Requests   Final    BOTTLES DRAWN AEROBIC AND ANAEROBIC Blood Culture adequate volume Performed at [facility] [address] [place], [place] [address]    Culture   Final    NO GROWTH Cross DAYS Performed at [facility] [address], [place], [place] [address]    Report Kurt PENDING  Incomplete     Medications:   . carvedilol  25 mg Oral BID WC  . furosemide  80 mg Oral QODAY  . gabapentin  300 mg Oral q morning - 10a  . insulin aspart  0-5 Units Subcutaneous QHS  . insulin aspart  0-9 Units Subcutaneous TID WC  . insulin aspart  3 Units Subcutaneous TID WC  . insulin detemir  10  Units Subcutaneous BID  . isosorbide-hydrALAZINE  1 tablet Oral BID  . magnesium oxide  400 mg Oral BID  . potassium chloride SA  40 mEq Oral BID  . rosuvastatin  10 mg Oral Daily  . spironolactone  25 mg Oral BID  . warfarin  Cross.5 mg  Oral ONCE-1800  . Warfarin - Pharmacist Dosing Inpatient   Does not apply q1800   Continuous Infusions: . cefTRIAXone (ROCEPHIN)  IV    . vancomycin Stopped (02/15/18 2321)      LOS: 3 days   Charlynne Cousins  [facility] Pager [phone]  *Please refer to [email or web address], password [email or web address] to get updated schedule on who will round on this patient, as hospitalists switch teams weekly. If 7PM-7AM, please contact night-coverage at [email or web address], password [email or web address] for any overnight needs.  02/16/2018, 11:14 AM

## 2018-02-16 NOTE — Progress Notes (Signed)
Pharmacy Antibiotic Note  Kurt Cross is a 67 y.o. male with sacral wound on 02/13/2018 presented to the ED on 02/13/18 with hyperglycemia.  Patient was found to have cellulitis of the foot.  LE CT neg for osteomyelitis. Vancomycin and ceftriaxone started on admission for cellulitis.  Today, 02/16/2018: - day #3 abx - afebrile. WBC 11.3 - Scr labile - up 2.02 (crcl~37 N) - MRSA and proteus mirabilis in wound cx   Plan: - continue vancomycin 1000 mg IV q24h - change ceftriaxone to 2gm IV q24h for weight - with plan for possible change to oral abx soon, will hold off on checking vancomycin level - f/u renal function  ________________________________________  Height: 6' (182.9 cm) Weight: 283 lb (128.4 kg) IBW/kg (Calculated) : 77.6  Temp (24hrs), Avg:98.8 F (37.1 C), Min:98.7 F (37.1 C), Max:98.9 F (37.2 C)  Recent Labs  Lab 02/13/18 1131 02/13/18 1508 02/14/18 0510 02/15/18 0419 02/16/18 0438  WBC 10.1  --  11.3*  --   --   CREATININE 2.27*  --  2.23* 1.76* 2.02*  LATICACIDVEN  --  2.17*  --   --   --     Estimated Creatinine Clearance: 51.2 mL/min (A) (by C-G formula based on SCr of 2.02 mg/dL (H)).    No Known Allergies  Microbiology results: 5/10 BCx x2:  5/8 left calcaneus wound: moderate MRSA; proteus mirabilis (R= cipro) FINAL  Prev Cx  5/8 L Calcaneus: Mod Staph aureus, Mod Proteus mirabilis Thank you for allowing pharmacy to be a part of this patient's care.  Lynelle Doctor 02/16/2018 12:39 PM

## 2018-02-16 NOTE — Consult Note (Signed)
[facility] Nurse wound consult note Reason for Consult:Nonhealing pressure injury to left lateral and plantar heel, present on admisson Right ischial stage 3 pressure injury Deep tissue injury to right plantar heel Wound type:pressure injury Pressure Injury POA: Yes Measurement: Left heel was recently debrided:  4.3 cmx 3 cm x 0.3 cm with 75% slough to wound bed.  Right heel:  1 cm x 1 cm maroon discoloration Right ischial tuberosity:  1 cmx 0.6 cm x 0.3 cm Wound bed: slough andpale pink nongranulating Drainage (amount, consistency, odor) minimal serosanguinous  No odor Periwound: left heel  Macerated.  Otherwise intact Dressing procedure/placement/frequency: Cleanse left heel and right ischium with NS and pat dry.  Apply Santyl to wound bed. COver with 4x4 gauze and kerlix/tape. ABD to ischial wound. Change daily.  Offload right heel.  Bilateral prevalon boots.  Will not follow at this time.  Please re-consult if needed.  Domenic Moras RN BSN [place] Pager [phone]

## 2018-02-17 DIAGNOSIS — N184 Chronic kidney disease, stage 4 (severe): Secondary | ICD-10-CM

## 2018-02-17 DIAGNOSIS — N183 Chronic kidney disease, stage 3 unspecified: Secondary | ICD-10-CM

## 2018-02-17 DIAGNOSIS — L97429 Non-pressure chronic ulcer of left heel and midfoot with unspecified severity: Secondary | ICD-10-CM

## 2018-02-17 DIAGNOSIS — R739 Hyperglycemia, unspecified: Secondary | ICD-10-CM

## 2018-02-17 DIAGNOSIS — E875 Hyperkalemia: Secondary | ICD-10-CM

## 2018-02-17 HISTORY — DX: Non-pressure chronic ulcer of left heel and midfoot with unspecified severity: L97.429

## 2018-02-17 HISTORY — DX: Chronic kidney disease, stage 3 unspecified: N18.30

## 2018-02-17 LAB — PROTIME-INR
INR: 2.51
PROTHROMBIN TIME: 26.9 s — AB (ref 11.4–15.2)

## 2018-02-17 LAB — BASIC METABOLIC PANEL
ANION GAP: 14 (ref 5–15)
BUN: 30 mg/dL — ABNORMAL HIGH (ref 6–20)
CALCIUM: 8.4 mg/dL — AB (ref 8.9–10.3)
CHLORIDE: 92 mmol/L — AB (ref 101–111)
CO2: 29 mmol/L (ref 22–32)
CREATININE: 1.68 mg/dL — AB (ref 0.61–1.24)
GFR calc non Af Amer: 41 mL/min — ABNORMAL LOW (ref >60)
GFR, EST AFRICAN AMERICAN: 48 mL/min — AB (ref >60)
Glucose, Bld: 190 mg/dL — ABNORMAL HIGH (ref 65–99)
Potassium: 2.9 mmol/L — ABNORMAL LOW (ref 3.5–5.1)
Sodium: 135 mmol/L (ref 135–145)

## 2018-02-17 LAB — GLUCOSE, CAPILLARY
GLUCOSE-CAPILLARY: 205 mg/dL — AB (ref 65–99)
GLUCOSE-CAPILLARY: 222 mg/dL — AB (ref 65–99)
Glucose-Capillary: 277 mg/dL — ABNORMAL HIGH (ref 65–99)
Glucose-Capillary: 281 mg/dL — ABNORMAL HIGH (ref 65–99)
Glucose-Capillary: 220 mg/dL — ABNORMAL HIGH (ref 65–99)

## 2018-02-17 MED ORDER — CIPROFLOXACIN HCL 500 MG PO TABS: 500.0000 mg | ORAL_TABLET | Freq: Two times a day (BID) | ORAL | 0 refills | Status: DC

## 2018-02-17 MED ORDER — AMOXICILLIN-POT CLAVULANATE 875-125 MG PO TABS: 1.0000 | ORAL_TABLET | Freq: Two times a day (BID) | ORAL | 0 refills | Status: AC

## 2018-02-17 MED ORDER — POTASSIUM CHLORIDE CRYS ER 20 MEQ PO TBCR
40.0000 meq | EXTENDED_RELEASE_TABLET | Freq: Three times a day (TID) | ORAL | Status: DC
  Administered 2018-02-17 – 2018-02-18 (×5): 40 meq via ORAL
  Filled 2018-02-17 (×5): qty 2

## 2018-02-17 MED ORDER — POTASSIUM CHLORIDE CRYS ER 20 MEQ PO TBCR: 40.0000 meq | EXTENDED_RELEASE_TABLET | Freq: Two times a day (BID) | ORAL | Status: DC

## 2018-02-17 MED ORDER — WARFARIN SODIUM 2.5 MG PO TABS
2.5000 mg | ORAL_TABLET | Freq: Once | ORAL | Status: AC
  Administered 2018-02-17: 2.5 mg via ORAL
  Filled 2018-02-17: qty 1

## 2018-02-17 MED ORDER — DOXYCYCLINE MONOHYDRATE 100 MG PO TABS: 100.0000 mg | ORAL_TABLET | Freq: Two times a day (BID) | ORAL | 0 refills | Status: DC

## 2018-02-17 NOTE — Clinical Social Work Note (Signed)
Clinical Social Work Assessment  Patient Details  Name: Kurt Cross MRN: [id number] Date of Birth: April 05, 1951  Date of referral:  02/17/18               Reason for consult:  Facility Placement                Permission sought to share information with:  Family Supports Permission granted to share information::     Name::      Wess Baney  Agency::   SNF  Relationship::   Spouse  Contact Information:     Housing/Transportation Living arrangements for the past 2 months:  Single Family Home Source of Information:  Patient Patient Interpreter Needed:  None Criminal Activity/Legal Involvement Pertinent to Current Situation/Hospitalization:  No - Comment as needed Significant Relationships:  Spouse Lives with:  Spouse, Adult Children Do you feel safe going back to the place where you live?  Yes Need for family participation in patient care:  Yes (assistance with wound care)  Care giving concerns:   Patient presented with worsening on left heel and sacral ulcers,  left lower extremity heel ulcer.   Patient was at [facility] for rehab and wound care but left after "not making any  Progress there."  Social Worker assessment / plan:  CSW met with patient with patient at bedside. Patient pleasant and agreeable to talk with CSW about his discharge plan. Patient reports his plan has been to go home instead of SNF but patient is unsure if home is the best option at this time.  He spent three weeks at the SNF due to weakness and needed wound assistance.  Patient reports having a bad experience and left without finishing treatment. Since then  his wife has been helping to care for his foot wound at home. Patient reports he is agreeable to SNF "if they provide the right care to get my wounds better." Patient discussed discharge plan with his wife. Both are agreeable to SNF for wound care and PT. She express concerns about patient inability to walk well due to the foot ulcer. She hopes physical therapy will help  the patient regain his strength.   FL2 complete.  Patient information sent to SNF's in [place] area.  [place] initiated at [facility].   Plan: SNF placement for rehab.   Employment status:    Insurance information:  Medicare PT Recommendations:  [place] / Referral to community resources:  [place]  Patient/Family's Response to care:  Patient and spouse are agreeable to transfer to a diifferent SNF. Patient is willing "to give SNF another chance."  Patient/Family's Understanding of and Emotional Response to Diagnosis, Current Treatment, and Prognosis:  Patient and spouse both knowledgeable of diagnosis and follow up care.  Emotional response: Patient appeared nervous about transition to SNF.   Emotional Assessment Appearance:  Appears stated age Attitude/Demeanor/Rapport:    Affect (typically observed):  Accepting, Pleasant Orientation:  Oriented to Self, Oriented to Place, Oriented to  Time, Oriented to Situation Alcohol / Substance use:  Not Applicable Psych involvement (Current and /or in the community):  No (Comment)  Discharge Needs  Concerns to be addressed:  Discharge Planning Concerns Readmission within the last 30 days:  No Current discharge risk:  Dependent with Mobility Barriers to Discharge:  Continued Medical Work up   [employer], LCSW 02/17/2018, 2:43 PM

## 2018-02-17 NOTE — Progress Notes (Signed)
ANTICOAGULATION CONSULT NOTE - Follow Up  Pharmacy Consult for Warfarin Indication: hx atrial fibrillation/flutter  No Known Allergies  Patient Measurements: Height: 6' (182.9 cm) Weight: 283 lb (128.4 kg) IBW/kg (Calculated) : 77.6   Vital Signs: Temp: 99 F (37.2 C) (05/14 0611) Temp Source: Oral (05/14 0611) BP: 136/76 (05/14 0859) Pulse Rate: 69 (05/14 0859)  Labs: Recent Labs    02/15/18 0419 02/16/18 0438 02/17/18 0437  LABPROT 19.5* 25.2* 26.9*  INR 1.66 2.31 2.51  CREATININE 1.76* 2.02* 1.68*    Estimated Creatinine Clearance: 61.5 mL/min (A) (by C-G formula based on SCr of 1.68 mg/dL (H)).  Assessment: Patient's a 67 y.o M on chronic warfarin therapy for a-fib/a-flutter admitted to [facility] on 02/13/2018 for foot wound. Pharmacy consulted to manage warfarin dosing while patient in the hospital.   - home warfarin regimen: 7.5 mg daily  Today, 02/17/18  INR 2.51 is therapeutic but increased noticeably from 1.66 to 2.31 after 10 mg x3 then 2.5 mg x1 doses  Last cbc on 5/11 stable  No bleeding documented  Being on abx can make patient more sensitive to warfarin (on vancomycin and ceftriaxone)  Goal of Therapy:  INR 2-3 Monitor platelets by anticoagulation protocol: Yes   Plan:  - Continue warfarin 2.5 mg x 1 today.   - If discharged today, recom to give 2.5mg  dose prior to discharge, then resume home dose 7.5 mg daily on 5/15. Recom to recheck INR no later than 5/17 if possible. - Daily INR - monitor for s/s bleeding  Gretta Arab PharmD, BCPS Pager [phone] 02/17/2018 9:49 AM

## 2018-02-17 NOTE — Discharge Summary (Addendum)
Physician Discharge Summary  Kurt Cross:[id number] DOB: 04-12-1951 DOA: 02/13/2018  PCP: [name], MD  Admit date: 02/13/2018 Discharge date: 02/17/2018  Admitted From: home Disposition:  Home  Recommendations for Outpatient Follow-up:  1. Follow up with PCP in 1-2 weeks 2. Please obtain BMP/CBC in one week 3. Follow up at the wound care clinic as an outpatient  Home Health:Yes Equipment/Devices:none  Discharge Condition:stable CODE STATUS:full  Diet recommendation: Heart Healthy   Brief/Interim Summary: 67 y.o. male *past medical history of diabetes mellitus type 2, essential hypertension and  systolic and diastolic heart failure, status post pacemaker with A. fib with a chads score greater than 4 on Coumadin comes into the emergency room with left heel sacral ulcer    Discharge Diagnoses:  Active Problems:   Diabetes mellitus, insulin dependent (IDDM), uncontrolled (HCC)   Obesity, Class III, BMI 40-49.9 (morbid obesity) (HCC)   Essential hypertension   Chronic combined systolic and diastolic CHF (congestive heart failure) (HCC)   Atrial fibrillation/flutter   Hyperkalemia   Severe sepsis (HCC)   Chronic respiratory failure (HCC)   Cellulitis of left lower extremity   Decubitus ulcer of sacral region, stage 3 (HCC)   Chronic ulcer of left heel (HCC)   CKD (chronic kidney disease), stage III (HCC)  Severe sepsis secondary to left foot lower extremity cellulitis/left heel ulcer: He was started empirically on IV vancomycin Rocephin, once the purulent improved he was changed to oral doxy and Augmentn which he will continue for 7 additional days.   Wound care was consulted and recommended wet-to-dry dressing changes.  Sacral decubitus ulcer: Wound care was consulted commended to keep the pressure off.  Kidney disease stage III: No change made to his medication.  Chronic diastolic heart failure: no changes made to his medication.  A. fib with a chads score  greater than 4: No changes made to his medication.  Diabetes mellitus type II: He will continue his current insulin regimen no changes were made.  Obesity:  Essential hypertension: Well-controlled no changes were made to his medication.  Hyperkalemia: He was repleted aggressively orally and it improved he will continue his current regimen as an outpatient.  Discharge Instructions  Discharge Instructions    Diet - low sodium heart healthy   Complete by:  As directed    Increase activity slowly   Complete by:  As directed      Allergies as of 02/17/2018   No Known Allergies     Medication List    TAKE these medications   amoxicillin-clavulanate 875-125 MG tablet Commonly known as:  AUGMENTIN Take 1 tablet by mouth every 12 (twelve) hours for 7 days.   carvedilol 25 MG tablet Commonly known as:  COREG Take 25 mg by mouth 2 (two) times daily.   cyclobenzaprine 5 MG tablet Commonly known as:  FLEXERIL Take 5 mg by mouth 3 (three) times daily.   doxycycline 100 MG tablet Commonly known as:  ADOXA Take 1 tablet (100 mg total) by mouth 2 (two) times daily.   furosemide 80 MG tablet Commonly known as:  LASIX Take 80 mg by mouth every other day.   gabapentin 300 MG capsule Commonly known as:  NEURONTIN Take 300 mg by mouth every morning.   insulin lispro 100 UNIT/ML injection Commonly known as:  HUMALOG Inject into the skin 2 (two) times daily with a meal. SLIDING SCALE-<200=0, 201-250=2 UNITS, 251-300=4 UNITS, 301-350=6 UNITS, 351-400=8 UNITS, 401-450=10 UNITS, 451-500=12 UNITS, <50 OR >500=CALL PEC  isosorbide-hydrALAZINE 20-37.5 MG tablet Commonly known as:  BIDIL Take 1 tablet by mouth 2 (two) times daily.   KLOR-CON M20 20 MEQ tablet Generic drug:  potassium chloride SA Take 20 mg by mouth 2 (two) times daily.   metolazone 2.5 MG tablet Commonly known as:  ZAROXOLYN Take 2.5 mg by mouth daily.   OVER THE COUNTER MEDICATION Take 1 tablet by mouth  daily. VITAMIN B-9 WITH FOLIC ACID   oxyCODONE-acetaminophen 5-325 MG tablet Commonly known as:  PERCOCET/ROXICET Take one tablet by mouth every four hours for pain. Do not exceed 4gm of Tylenol in 24 hours   rosuvastatin 10 MG tablet Commonly known as:  CRESTOR Take 10 mg by mouth daily.   spironolactone 25 MG tablet Commonly known as:  ALDACTONE Take 1 tablet (25 mg total) by mouth 2 (two) times daily.   warfarin 7.5 MG tablet Commonly known as:  COUMADIN Take 7.5 mg by mouth daily.      Contact information for after-discharge care    Destination    [facility] .   Service:  Skilled Nursing Contact information: [place] [phone]             No Known Allergies  Consultations:  None   Procedures/Studies: Dg Chest 1 View  Result Date: 02/13/2018 CLINICAL DATA:  67 year old male with with hyperglycemia, decreased p.o. intake. Recent surgery for debridement of a foot/heel wound. EXAM: CHEST  1 VIEW COMPARISON:  Portable chest radiographs 01/26/2016 and earlier. FINDINGS: Semi upright AP and lateral views of the chest. Chronic cardiomegaly and left chest cardiac pacemaker. Lordotic AP view today. Stable lung volumes. No pneumothorax, pulmonary edema, pleural effusion or confluent pulmonary opacity identified. No acute osseous abnormality identified. Paucity bowel gas in the upper abdomen. IMPRESSION: Stable cardiomegaly. No acute cardiopulmonary abnormality. Electronically Signed   By: Genevie Ann M.D.   On: 02/13/2018 16:55   Ct Pelvis Wo Contrast  Result Date: 02/13/2018 CLINICAL DATA:  Soft tissue swelling. Concern for sacral decubitus ulcer or abscess. EXAM: CT PELVIS WITHOUT CONTRAST TECHNIQUE: Multidetector CT imaging of the pelvis was performed following the standard protocol without intravenous contrast. COMPARISON:  CT abdomen pelvis 11/10/2017 FINDINGS: Urinary Tract:  No abnormality visualized. Bowel:   Unremarkable visualized pelvic bowel loops. Vascular/Lymphatic: Atherosclerotic calcification of the aorta and iliac arteries. Reproductive:  No mass or other significant abnormality Other:  None. Musculoskeletal: No suspicious bone lesions identified. There is no sacral decubitus ulcer or subcutaneous lesion. No abscess or fluid collection. IMPRESSION: No sacral decubitus ulcer or abscess. Aortic Atherosclerosis (ICD10-I70.0). Electronically Signed   By: Ulyses Jarred M.D.   On: 02/13/2018 17:14   Dg Foot Complete Left  Result Date: 02/13/2018 CLINICAL DATA:  Heel ulcer and foot pain EXAM: LEFT FOOT - COMPLETE 3+ VIEW COMPARISON:  Left foot radiograph 02/11/2018 FINDINGS: Diffuse osteopenia. No acute fracture or dislocation. Previously seen heel ulcer is not visible due to positioning. No visible osteolysis. IMPRESSION: Allowing for differences in positioning, unchanged radiographs of the left foot without osseous acute abnormality. Electronically Signed   By: Ulyses Jarred M.D.   On: 02/13/2018 16:55   Dg Foot Complete Left  Result Date: 02/12/2018 CLINICAL DATA:  Left heel pressure ulcer. EXAM: LEFT FOOT - COMPLETE 3+ VIEW COMPARISON:  None. FINDINGS: Plantar ulceration of the hindfoot. No osseous destruction. No acute fracture or dislocation. Mild degenerative changes of the foot. Diffuse osteopenia. IMPRESSION: Plantar ulceration of the hindfoot.  No acute osseous abnormality.  Electronically Signed   By: Titus Dubin M.D.   On: 02/12/2018 09:25   Ct Extremity Lower Left Wo Contrast  Result Date: 02/13/2018 CLINICAL DATA:  LEFT heel ulcer, sacral ulcer, atrial fibrillation, CHF, diabetes mellitus, fever, CHF, former smoker EXAM: CT OF THE LOWER LEFT EXTREMITY WITHOUT CONTRAST TECHNIQUE: Multidetector CT imaging of the lower left extremity was performed according to the standard protocol. Sagittal and coronal MPR images reconstructed from axial data set. COMPARISON:  CT pelvis 02/13/2018, FINDINGS:  Bones/Joint/Cartilage Osseous demineralization. Visualized pelvis intact. Degenerative changes of LEFT hip joint with mild joint space narrowing and spur formation. Degenerative changes are also present at the LEFT knee joint with joint space narrowing, minimal spurring, chondrocalcinosis, and associated knee joint effusion. No fracture or dislocation. No bone destruction identified to suggest osteomyelitis. Mild scattered degenerative changes at intertarsal joints and at ankle joint. Ligaments Suboptimally assessed by CT. Muscles and Tendons Tendons suboptimally assessed by CT. Fatty atrophy of the medial LEFT gastrocnemius muscle. Muscular planes appear atrophic but otherwise grossly normal appearance. Soft tissues Skin thickening and subcutaneous edema at the posteromedial LEFT heel region likely representing patient's heel ulcer. Soft tissue swelling extends into the medial soft tissues at the plantar arch. LEFT knee joint effusion. No definite hip joint effusion. Scattered atherosclerotic calcifications. No abnormal fluid collections to suggest abscess. Multiple normal to upper normal sized LEFT inguinal lymph nodes. LEFT inguinal hernia containing fat. Intrapelvic structures otherwise unremarkable. IMPRESSION: No CT evidence of bone destruction adjacent to the posteromedial heel ulcer to suggest osteomyelitis. Scattered degenerative changes at LEFT hip, knee, and ankle/foot with associated LEFT knee joint effusion. Fatty atrophy of medial LEFT gastrocnemius muscle. Small LEFT inguinal hernia containing fat. Electronically Signed   By: Lavonia Dana M.D.   On: 02/13/2018 17:45    Subjective: Complaints feels great.  Discharge Exam: Vitals:   02/17/18 0859 02/17/18 1346  BP: 136/76 (!) 114/59  Pulse: 69 69  Resp:  18  Temp:  98.1 F (36.7 C)  SpO2:  95%   Vitals:   02/16/18 2128 02/17/18 0611 02/17/18 0859 02/17/18 1346  BP: 125/62 125/74 136/76 (!) 114/59  Pulse: 69 70 69 69  Resp: 20 17  18    Temp: 99.2 F (37.3 C) 99 F (37.2 C)  98.1 F (36.7 C)  TempSrc: Oral Oral  Oral  SpO2: 96% 98%  95%  Weight:      Height:        General: Pt is alert, awake, not in acute distress Cardiovascular: RRR, S1/S2 +, no rubs, no gallops Respiratory: CTA bilaterally, no wheezing, no rhonchi Abdominal: Soft, NT, ND, bowel sounds + Extremities: no edema, no cyanosis    The results of significant diagnostics from this hospitalization (including imaging, microbiology, ancillary and laboratory) are listed below for reference.     Microbiology: Recent Results (from the past 240 hour(s))  Aerobic Culture (superficial specimen)     Status: None   Collection Time: 02/11/18  3:20 PM  Result Value Ref Range Status   Specimen Description   Final    WOUND LEFT CALCANEUS Performed at [facility] [address] [place], [place] [address]    Special Requests   Final    NONE Performed at [facility], [place] [address] [place], [place] [address]    Gram Stain   Final    RARE WBC PRESENT, PREDOMINANTLY PMN RARE GRAM NEGATIVE RODS    Culture   Final    MODERATE METHICILLIN  RESISTANT STAPHYLOCOCCUS AUREUS MODERATE PROTEUS MIRABILIS    Report Status 02/15/2018 FINAL  Final   Organism ID, Bacteria PROTEUS MIRABILIS  Final   Organism ID, Bacteria METHICILLIN RESISTANT STAPHYLOCOCCUS AUREUS  Final      Susceptibility   Methicillin resistant staphylococcus aureus - MIC*    CIPROFLOXACIN >=8 RESISTANT Resistant     ERYTHROMYCIN >=8 RESISTANT Resistant     GENTAMICIN <=0.5 SENSITIVE Sensitive     OXACILLIN >=4 RESISTANT Resistant     TETRACYCLINE <=1 SENSITIVE Sensitive     VANCOMYCIN 1 SENSITIVE Sensitive     TRIMETH/SULFA >=320 RESISTANT Resistant     CLINDAMYCIN <=0.25 SENSITIVE Sensitive     RIFAMPIN <=0.5 SENSITIVE Sensitive     Inducible Clindamycin NEGATIVE Sensitive     * MODERATE METHICILLIN RESISTANT STAPHYLOCOCCUS AUREUS   Proteus  mirabilis - MIC*    AMPICILLIN <=2 SENSITIVE Sensitive     CEFAZOLIN <=4 SENSITIVE Sensitive     CEFEPIME <=1 SENSITIVE Sensitive     CEFTAZIDIME <=1 SENSITIVE Sensitive     CEFTRIAXONE <=1 SENSITIVE Sensitive     CIPROFLOXACIN >=4 RESISTANT Resistant     GENTAMICIN <=1 SENSITIVE Sensitive     IMIPENEM 4 SENSITIVE Sensitive     TRIMETH/SULFA <=20 SENSITIVE Sensitive     AMPICILLIN/SULBACTAM <=2 SENSITIVE Sensitive     PIP/TAZO <=4 SENSITIVE Sensitive     * MODERATE PROTEUS MIRABILIS  Culture, blood (routine x 2)     Status: None (Preliminary result)   Collection Time: 02/13/18  2:40 PM  Result Value Ref Range Status   Specimen Description   Final    BLOOD RIGHT FOREARM Performed at [facility], [place] [address] [place], [place] [address]    Special Requests   Final    BOTTLES DRAWN AEROBIC ONLY Blood Culture results may not be optimal due to an inadequate volume of blood received in culture bottles Performed at [facility] [address] [place], [place] [address]    Culture   Final    NO GROWTH 4 DAYS Performed at [facility], [place] [address] [place], [place] [address]    Report Status PENDING  Incomplete  Culture, blood (routine x 2)     Status: None (Preliminary result)   Collection Time: 02/13/18  2:54 PM  Result Value Ref Range Status   Specimen Description   Final    BLOOD LEFT ANTECUBITAL Performed at [facility] [address], [place], [place] [address]    Special Requests   Final    BOTTLES DRAWN AEROBIC AND ANAEROBIC Blood Culture adequate volume Performed at [facility] [address] [place], [place] [address]    Culture   Final    NO GROWTH 4 DAYS Performed at [facility], [place] [address] [place], [place] [address]    Report Status PENDING  Incomplete     Labs: BNP (last 3 results) No results for input(s): BNP in the last 8760 hours. Basic Metabolic  Panel: Recent Labs  Lab 02/13/18 1131 02/13/18 1454 02/14/18 0510 02/15/18 0419 02/16/18 0438 02/17/18 0437  NA 133*  --  136 134* 136 135  K 2.6*  --  2.3* 2.8* 2.6* 2.9*  CL 86*  --  88* 90* 92* 92*  CO2 31  --  32 29 29 29   GLUCOSE 391*  --  257* 264* 257* 190*  BUN 54*  --  50* 39* 36* 30*  CREATININE 2.27*  --  2.23* 1.76* 2.02* 1.68*  CALCIUM 8.6*  --  8.6* 8.3* 8.3* 8.4*  MG  --  1.5*  --   --   --   --    Liver Function Tests: Recent Labs  Lab 02/14/18 0510  AST 17  ALT 16*  ALKPHOS 24*  BILITOT 0.6  PROT 7.1  ALBUMIN 2.5*   No results for input(s): LIPASE, AMYLASE in the last 168 hours. No results for input(s): AMMONIA in the last 168 hours. CBC: Recent Labs  Lab 02/13/18 1131 02/14/18 0510  WBC 10.1 11.3*  HGB 14.9 14.1  HCT 44.3 42.7  MCV 92.7 92.2  PLT 240 257   Cardiac Enzymes: No results for input(s): CKTOTAL, CKMB, CKMBINDEX, TROPONINI in the last 168 hours. BNP: Invalid input(s): POCBNP CBG: Recent Labs  Lab 02/16/18 1202 02/16/18 1704 02/16/18 2124 02/17/18 0800 02/17/18 1340  GLUCAP 290* 269* 271* 205* 277*   D-Dimer No results for input(s): DDIMER in the last 72 hours. Hgb A1c No results for input(s): HGBA1C in the last 72 hours. Lipid Profile No results for input(s): CHOL, HDL, LDLCALC, TRIG, CHOLHDL, LDLDIRECT in the last 72 hours. Thyroid function studies No results for input(s): TSH, T4TOTAL, T3FREE, THYROIDAB in the last 72 hours.  Invalid input(s): FREET3 Anemia work up No results for input(s): VITAMINB12, FOLATE, FERRITIN, TIBC, IRON, RETICCTPCT in the last 72 hours. Urinalysis    Component Value Date/Time   COLORURINE YELLOW 02/13/2018 1658   APPEARANCEUR TURBID (A) 02/13/2018 1658   LABSPEC 1.010 02/13/2018 1658   PHURINE 5.0 02/13/2018 1658   GLUCOSEU >=500 (A) 02/13/2018 1658   HGBUR MODERATE (A) 02/13/2018 1658   BILIRUBINUR NEGATIVE 02/13/2018 1658   KETONESUR NEGATIVE 02/13/2018 1658   PROTEINUR 100 (A)  02/13/2018 1658   UROBILINOGEN 0.2 11/13/2013 0630   NITRITE NEGATIVE 02/13/2018 1658   LEUKOCYTESUR LARGE (A) 02/13/2018 1658   Sepsis Labs Invalid input(s): PROCALCITONIN,  WBC,  LACTICIDVEN Microbiology Recent Results (from the past 240 hour(s))  Aerobic Culture (superficial specimen)     Status: None   Collection Time: 02/11/18  3:20 PM  Result Value Ref Range Status   Specimen Description   Final    WOUND LEFT CALCANEUS Performed at [facility], [place] [address] [place], [place] [address]    Special Requests   Final    NONE Performed at [facility], [place] [address], [place], [place] [address]    Gram Stain   Final    RARE WBC PRESENT, PREDOMINANTLY PMN RARE GRAM NEGATIVE RODS    Culture   Final    MODERATE METHICILLIN RESISTANT STAPHYLOCOCCUS AUREUS MODERATE PROTEUS MIRABILIS    Report Status 02/15/2018 FINAL  Final   Organism ID, Bacteria PROTEUS MIRABILIS  Final   Organism ID, Bacteria METHICILLIN RESISTANT STAPHYLOCOCCUS AUREUS  Final      Susceptibility   Methicillin resistant staphylococcus aureus - MIC*    CIPROFLOXACIN >=8 RESISTANT Resistant     ERYTHROMYCIN >=8 RESISTANT Resistant     GENTAMICIN <=0.5 SENSITIVE Sensitive     OXACILLIN >=4 RESISTANT Resistant     TETRACYCLINE <=1 SENSITIVE Sensitive     VANCOMYCIN 1 SENSITIVE Sensitive     TRIMETH/SULFA >=320 RESISTANT Resistant     CLINDAMYCIN <=0.25 SENSITIVE Sensitive     RIFAMPIN <=0.5 SENSITIVE Sensitive     Inducible Clindamycin NEGATIVE Sensitive     * MODERATE METHICILLIN RESISTANT STAPHYLOCOCCUS AUREUS   Proteus mirabilis - MIC*    AMPICILLIN <=2 SENSITIVE Sensitive  CEFAZOLIN <=4 SENSITIVE Sensitive     CEFEPIME <=1 SENSITIVE Sensitive     CEFTAZIDIME <=1 SENSITIVE Sensitive     CEFTRIAXONE <=1 SENSITIVE Sensitive     CIPROFLOXACIN >=4 RESISTANT Resistant     GENTAMICIN <=1 SENSITIVE Sensitive     IMIPENEM 4 SENSITIVE Sensitive     TRIMETH/SULFA <=20  SENSITIVE Sensitive     AMPICILLIN/SULBACTAM <=2 SENSITIVE Sensitive     PIP/TAZO <=4 SENSITIVE Sensitive     * MODERATE PROTEUS MIRABILIS  Culture, blood (routine x 2)     Status: None (Preliminary result)   Collection Time: 02/13/18  2:40 PM  Result Value Ref Range Status   Specimen Description   Final    BLOOD RIGHT FOREARM Performed at [facility] [address], [place], [place] [address]    Special Requests   Final    BOTTLES DRAWN AEROBIC ONLY Blood Culture results may not be optimal due to an inadequate volume of blood received in culture bottles Performed at [facility] [address], [place], [place] [address]    Culture   Final    NO GROWTH 4 DAYS Performed at [facility], [place] [address], [place], [place] [address]    Report Status PENDING  Incomplete  Culture, blood (routine x 2)     Status: None (Preliminary result)   Collection Time: 02/13/18  2:54 PM  Result Value Ref Range Status   Specimen Description   Final    BLOOD LEFT ANTECUBITAL Performed at [facility] [address], [place], [place] [address]    Special Requests   Final    BOTTLES DRAWN AEROBIC AND ANAEROBIC Blood Culture adequate volume Performed at [facility] [address], [place], [place] [address]    Culture   Final    NO GROWTH 4 DAYS Performed at [facility], [place] [address] [place], [place] [address]    Report Status PENDING  Incomplete     Time coordinating discharge: 35 minutes  SIGNED:   Charlynne Cousins, MD  [facility] 02/17/2018, 3:21 PM Pager   If 7PM-7AM, please contact night-coverage [email or web address] Password [email or web address]

## 2018-02-17 NOTE — Progress Notes (Signed)
Insruance authorization received for SNFplacement. The admitting nurse at SNF as left for the evening and will not accept patient this evening. Patient and Spouse [name] updated.  CSW informed nurse.   Kathrin Greathouse, Latanya Presser, MSW Clinical Social Worker  [phone] 02/17/2018  5:28 PM

## 2018-02-17 NOTE — Care Management Important Message (Signed)
Important Message  Patient Details  Name: MERRIK PUEBLA MRN: [id number] Date of Birth: 06/03/1951   Medicare Important Message Given:  Yes    Kerin Salen 02/17/2018, 10:58 AMImportant Message  Patient Details  Name: GADIEL JOHN MRN: [id number] Date of Birth: 1950-10-09   Medicare Important Message Given:  Yes    Kerin Salen 02/17/2018, 10:58 AM

## 2018-02-17 NOTE — NC FL2 (Addendum)
[place] LEVEL OF CARE SCREENING TOOL     IDENTIFICATION  Patient Name: Kurt Cross Birthdate: 10-14-1950 Sex: male Admission Date (Current Location): 02/13/2018  [facility] and [place] Number:  Herbalist and Address:  [facility],  [place] [place], [place]      Provider Number: [id number]  Attending Physician Name and Address:  Charlynne Cousins, MD  Relative Name and Phone Number:    spouse susan Taliercio     Current Level of Care: Hospital Recommended Level of Care: [place] Prior Approval Number:    Date Approved/Denied:   PASRR Number:    [id number]  Discharge Plan: SNF    Current Diagnoses: Patient Active Problem List   Diagnosis Date Noted  . Chronic ulcer of left heel ([place]) 02/17/2018  . Cellulitis of left lower extremity 02/14/2018  . Decubitus ulcer of sacral region, stage 3 ([place]) 02/14/2018  . Abscess 02/13/2018  . S/P placement of cardiac pacemaker 02/22/2016  . Atrial flutter ([place]) 02/22/2016  . Chronic anticoagulation 02/22/2016  . H/O medication noncompliance 02/22/2016  . Debility 02/10/2016  . Chronic respiratory failure ([place]) 02/10/2016  . Constipation 02/07/2016  . Bradycardia   . Acute on chronic combined systolic and diastolic CHF, NYHA class 2 ([place]) 01/27/2016  . Acute on chronic renal failure ([place]) 01/27/2016  . Cellulitis 01/27/2016  . Acute on chronic respiratory failure with hypoxia ([place]) 01/27/2016  . Elevated troponin 01/27/2016  . Hypoxia 01/26/2016  . Hyperkalemia 11/12/2013  . Severe sepsis ([place]) 11/12/2013  . Atrial fibrillation/flutter 10/06/2013  . 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 Recurrent ventral incisional hernia 08/14/2013  . AAA (abdominal aortic aneurysm)/ 4.5 cm ascending per CT angio 08/01/13 08/12/2013  . Right leg pain 08/11/2013  .  Chronic combined systolic and diastolic CHF (congestive heart failure) ([place]) 08/04/2013  . Essential hypertension 06/28/2012  . Diabetes mellitus, insulin dependent (IDDM), uncontrolled ([place]) 09/01/2007  . Hyperlipidemia 09/01/2007  . Obesity, Class III, BMI 40-49.9 (morbid obesity) ([place]) 09/01/2007  . Obstructive sleep apnea 09/01/2007    Orientation RESPIRATION BLADDER Height & Weight     Self, Time, Situation, Place  Normal Continent Weight: 283 lb (128.4 kg) Height:  6' (182.9 cm)  BEHAVIORAL SYMPTOMS/MOOD NEUROLOGICAL BOWEL NUTRITION STATUS      Continent Diet(Low Sodium Heart Healthy )  AMBULATORY STATUS COMMUNICATION OF NEEDS Skin   Extensive Assist Verbally PU Stage and Appropriate Care   Nonhealing pressure injury to left lateral and plantar heel, present on admisson Right ischial stage 3 pressure injury Deep tissue injury to right plantar heel Periwound: left heel  Macerated.  Otherwise intact                      Personal Care Assistance Level of Assistance  Bathing, Dressing, Feeding Bathing Assistance: Limited assistance Feeding assistance: Independent Dressing Assistance: Limited assistance     Functional Limitations Info  Sight, Hearing, Speech Sight Info: Impaired Hearing Info: Adequate Speech Info: Adequate    SPECIAL CARE FACTORS FREQUENCY  PT (By licensed PT), OT (By licensed OT)     PT Frequency: 5x/week OT Frequency: 5x/week            Contractures Contractures Info: Not present    Additional Factors Info  Code Status, Allergies Code Status Info: Fullcode  Allergies Info: Allergies: No Known Allergies  Current Medications (02/17/2018):  This is the current hospital active medication list Current Facility-Administered Medications  Medication Dose Route Frequency Provider Last Rate Last Dose  . carvedilol (COREG) tablet 25 mg  25 mg Oral BID WC Nita Sells, MD   25 mg at 02/17/18 0858  . cefTRIAXone (ROCEPHIN) 2 g  in sodium chloride 0.9 % 100 mL IVPB  2 g Intravenous Q24H Charlynne Cousins, MD   Stopped at 02/16/18 2355  . collagenase (SANTYL) ointment   Topical Daily Charlynne Cousins, MD      . furosemide (LASIX) tablet 80 mg  80 mg Oral QODAY Charlynne Cousins, MD   80 mg at 02/17/18 0917  . gabapentin (NEURONTIN) capsule 300 mg  300 mg Oral q morning - 10a Nita Sells, MD   300 mg at 02/17/18 0917  . insulin aspart (novoLOG) injection 0-5 Units  0-5 Units Subcutaneous QHS Charlynne Cousins, MD   3 Units at 02/16/18 2321  . insulin aspart (novoLOG) injection 0-9 Units  0-9 Units Subcutaneous TID WC Charlynne Cousins, MD   3 Units at 02/17/18 [phone]  . insulin aspart (novoLOG) injection 3 Units  3 Units Subcutaneous TID WC Charlynne Cousins, MD   3 Units at 02/17/18 [phone]  . insulin detemir (LEVEMIR) injection 10 Units  10 Units Subcutaneous BID Charlynne Cousins, MD   10 Units at 02/17/18 1121  . isosorbide-hydrALAZINE (BIDIL) 20-37.5 MG per tablet 1 tablet  1 tablet Oral BID Nita Sells, MD   1 tablet at 02/17/18 0917  . oxyCODONE-acetaminophen (PERCOCET/ROXICET) 5-325 MG per tablet 1 tablet  1 tablet Oral Q4H PRN Neila Gear, NP   1 tablet at 02/17/18 0917  . potassium chloride SA (K-DUR,KLOR-CON) CR tablet 40 mEq  40 mEq Oral TID Charlynne Cousins, MD      . rosuvastatin (CRESTOR) tablet 10 mg  10 mg Oral Daily Charlynne Cousins, MD   10 mg at 02/17/18 6301  . spironolactone (ALDACTONE) tablet 25 mg  25 mg Oral BID Charlynne Cousins, MD   25 mg at 02/17/18 0858  . vancomycin (VANCOCIN) IVPB 1000 mg/200 mL premix  1,000 mg Intravenous Q24H Minda Ditto, RPH   Stopped at 02/17/18 0030  . warfarin (COUMADIN) tablet 2.5 mg  2.5 mg Oral ONCE-1800 [place], Haze Justin, RPH      . Warfarin - Pharmacist Dosing Inpatient   Does not apply q1800 Luiz Ochoa, [facility]         Discharge Medications: Please see discharge summary for a list of discharge  medications.  Relevant Imaging Results:  Relevant Lab Results:   Additional Information SS# [id number]  Lia Hopping, LCSW

## 2018-02-18 DIAGNOSIS — L89313 Pressure ulcer of right buttock, stage 3: Secondary | ICD-10-CM | POA: Diagnosis not present

## 2018-02-18 DIAGNOSIS — E8809 Other disorders of plasma-protein metabolism, not elsewhere classified: Secondary | ICD-10-CM | POA: Diagnosis not present

## 2018-02-18 DIAGNOSIS — L089 Local infection of the skin and subcutaneous tissue, unspecified: Secondary | ICD-10-CM

## 2018-02-18 DIAGNOSIS — E10621 Type 1 diabetes mellitus with foot ulcer: Secondary | ICD-10-CM | POA: Diagnosis not present

## 2018-02-18 DIAGNOSIS — J40 Bronchitis, not specified as acute or chronic: Secondary | ICD-10-CM | POA: Diagnosis not present

## 2018-02-18 DIAGNOSIS — M6281 Muscle weakness (generalized): Secondary | ICD-10-CM | POA: Diagnosis not present

## 2018-02-18 DIAGNOSIS — R253 Fasciculation: Secondary | ICD-10-CM | POA: Diagnosis not present

## 2018-02-18 DIAGNOSIS — N183 Chronic kidney disease, stage 3 (moderate): Secondary | ICD-10-CM | POA: Diagnosis not present

## 2018-02-18 DIAGNOSIS — G4700 Insomnia, unspecified: Secondary | ICD-10-CM | POA: Diagnosis not present

## 2018-02-18 DIAGNOSIS — R279 Unspecified lack of coordination: Secondary | ICD-10-CM | POA: Diagnosis not present

## 2018-02-18 DIAGNOSIS — E119 Type 2 diabetes mellitus without complications: Secondary | ICD-10-CM | POA: Diagnosis not present

## 2018-02-18 DIAGNOSIS — I509 Heart failure, unspecified: Secondary | ICD-10-CM | POA: Diagnosis not present

## 2018-02-18 DIAGNOSIS — I4891 Unspecified atrial fibrillation: Secondary | ICD-10-CM | POA: Diagnosis not present

## 2018-02-18 DIAGNOSIS — L97429 Non-pressure chronic ulcer of left heel and midfoot with unspecified severity: Secondary | ICD-10-CM | POA: Diagnosis not present

## 2018-02-18 DIAGNOSIS — Z7901 Long term (current) use of anticoagulants: Secondary | ICD-10-CM | POA: Diagnosis not present

## 2018-02-18 DIAGNOSIS — L03116 Cellulitis of left lower limb: Secondary | ICD-10-CM | POA: Diagnosis not present

## 2018-02-18 DIAGNOSIS — L89623 Pressure ulcer of left heel, stage 3: Secondary | ICD-10-CM | POA: Diagnosis not present

## 2018-02-18 DIAGNOSIS — M62838 Other muscle spasm: Secondary | ICD-10-CM | POA: Diagnosis not present

## 2018-02-18 DIAGNOSIS — R498 Other voice and resonance disorders: Secondary | ICD-10-CM | POA: Diagnosis not present

## 2018-02-18 DIAGNOSIS — E11628 Type 2 diabetes mellitus with other skin complications: Secondary | ICD-10-CM

## 2018-02-18 DIAGNOSIS — R278 Other lack of coordination: Secondary | ICD-10-CM | POA: Diagnosis not present

## 2018-02-18 DIAGNOSIS — Z743 Need for continuous supervision: Secondary | ICD-10-CM | POA: Diagnosis not present

## 2018-02-18 DIAGNOSIS — G4733 Obstructive sleep apnea (adult) (pediatric): Secondary | ICD-10-CM | POA: Diagnosis not present

## 2018-02-18 DIAGNOSIS — R109 Unspecified abdominal pain: Secondary | ICD-10-CM | POA: Diagnosis not present

## 2018-02-18 LAB — BASIC METABOLIC PANEL
Anion gap: 11 (ref 5–15)
BUN: 28 mg/dL — AB (ref 6–20)
CO2: 28 mmol/L (ref 22–32)
CREATININE: 1.86 mg/dL — AB (ref 0.61–1.24)
Calcium: 8.5 mg/dL — ABNORMAL LOW (ref 8.9–10.3)
Chloride: 96 mmol/L — ABNORMAL LOW (ref 101–111)
GFR calc Af Amer: 42 mL/min — ABNORMAL LOW (ref >60)
GFR, EST NON AFRICAN AMERICAN: 37 mL/min — AB (ref >60)
Glucose, Bld: 286 mg/dL — ABNORMAL HIGH (ref 65–99)
Potassium: 4.2 mmol/L (ref 3.5–5.1)
SODIUM: 135 mmol/L (ref 135–145)

## 2018-02-18 LAB — CBC
HCT: 39.1 % (ref 39.0–52.0)
Hemoglobin: 12.8 g/dL — ABNORMAL LOW (ref 13.0–17.0)
MCH: 30.7 pg (ref 26.0–34.0)
MCHC: 32.7 g/dL (ref 30.0–36.0)
MCV: 93.8 fL (ref 78.0–100.0)
PLATELETS: 251 10*3/uL (ref 150–400)
RBC: 4.17 MIL/uL — ABNORMAL LOW (ref 4.22–5.81)
RDW: 16.7 % — AB (ref 11.5–15.5)
WBC: 9.9 10*3/uL (ref 4.0–10.5)

## 2018-02-18 LAB — CULTURE, BLOOD (ROUTINE X 2)
CULTURE: NO GROWTH
CULTURE: NO GROWTH
SPECIAL REQUESTS: ADEQUATE

## 2018-02-18 LAB — MAGNESIUM: MAGNESIUM: 1.4 mg/dL — AB (ref 1.7–2.4)

## 2018-02-18 LAB — GLUCOSE, CAPILLARY
Glucose-Capillary: 255 mg/dL — ABNORMAL HIGH (ref 65–99)
Glucose-Capillary: 209 mg/dL — ABNORMAL HIGH (ref 65–99)

## 2018-02-18 LAB — PROTIME-INR
INR: 2.11
PROTHROMBIN TIME: 23.4 s — AB (ref 11.4–15.2)

## 2018-02-18 MED ORDER — WARFARIN SODIUM 5 MG PO TABS: 7.5000 mg | ORAL_TABLET | Freq: Once | ORAL | Status: DC

## 2018-02-18 MED ORDER — COLLAGENASE 250 UNIT/GM EX OINT: TOPICAL_OINTMENT | Freq: Every day | CUTANEOUS | Status: DC

## 2018-02-18 MED ORDER — INSULIN LISPRO 100 UNIT/ML ~~LOC~~ SOLN: 0.0000 [IU] | Freq: Three times a day (TID) | SUBCUTANEOUS | Status: DC

## 2018-02-18 MED ORDER — INSULIN DETEMIR 100 UNIT/ML ~~LOC~~ SOLN: 12.0000 [IU] | Freq: Two times a day (BID) | SUBCUTANEOUS | Status: DC

## 2018-02-18 MED ORDER — MAGNESIUM SULFATE 2 GM/50ML IV SOLN
2.0000 g | Freq: Once | INTRAVENOUS | Status: AC
  Administered 2018-02-18: 2 g via INTRAVENOUS
  Filled 2018-02-18: qty 50

## 2018-02-18 NOTE — Progress Notes (Addendum)
Patient interviewed and examined this morning.  He reports that he has been nonambulatory for approximately 9 months and moves around with the help of a wheelchair.  He has seen at least 3 different neurologists for his lower extremity weakness without clear diagnosis.  He reports intermittent lower extremity involuntary muscle spasms.  He underwent I&D of left heel wound recently.  Currently denies complaints.  No pain, fever or chills.  Tolerated diet.  As per RN, no acute issues reported.  Physical exam:  Pleasant middle-aged male, moderately built and obese lying comfortably propped up in bed. Vitals:   02/17/18 1744 02/17/18 2144 02/18/18 0646 02/18/18 0934  BP: (!) 141/80 130/76 132/67 138/85  Pulse: 69 69 67 69  Resp:  18 20 18   Temp:  97.9 F (36.6 C) 98.3 F (36.8 C)   TempSrc:  Oral Oral   SpO2:  95% 93%   Weight:      Height:       RS: Clear to auscultation.  No increased work of breathing.   CVS: S1 and S2 heard, RRR.  No JVD, murmurs or pedal edema. Gastrointestinal system: Abdomen is obese/nondistended.  Laparotomy scars present.  Soft and nontender.  No organomegaly or masses appreciated.  Normal bowel sounds heard. CNS: Alert and oriented.  No cranial nerve deficits. Extremities: No cyanosis clubbing or edema.  At least grade 3 x 5 power in both lower extremities and grade 5 x 5 power in upper extremities.  Approximately 4 x 3 cm wound over left heel with no active drainage, patchy minimal slough, no bleeding and no acute findings suggestive of cellulitis.  Right heel with approximately 1 cm diameter maroon discoloration (appears like a resolving hemorrhagic blister).  Lab work 5/15 ordered and reviewed: Significant for glucose 286, BUN 28, creatinine 1.86, magnesium 1.4 and INR 2.11.  Assessment and plan:  1. Infected left heel ulcer with left foot (Proteus and MRSA)/leg cellulitis: Completed treatment with IV appropriate antibiotics and changed to oral doxycycline &  Augmentin based on culture results to complete additional 7 days treatment.  Continue wound care and float heels at SNF. 2. Sacral decubitus ulcer: Management as per wound care team recommendations. 3. Uncontrolled type II DM with peripheral neuropathy: A1c 10.  CBGs in the 200s.  Continue Levemir initiated in the hospital.  Continue SSI.  Adjust insulins as needed. 4. Essential hypertension: Controlled on current regimen. 5. A. fib: Rate controlled and anticoagulated.  Continue prior home dose of Coumadin and follow-up INR in 2 days and adjust Coumadin as needed. 6. Hypokalemia: Replaced. 7. Hypomagnesemia: Replaced IV prior to discharge.  Follow BMP and magnesium in 2 days. 8. Chronic combined systolic and diastolic CHF: Compensated. 9. Stage III chronic kidney disease: Creatinine fluctuating but likely close to his baseline.  Follow BMP periodically as outpatient. 10. Lower extremity weakness: Unclear etiology.  Outpatient follow-up  Disposition: Patient was supposed to be discharged to SNF on 5/14 but could not go due to unclear reasons.  Discharged summary including medications updated and patient will discharge to [facility] 5/15.    Vernell Leep, MD, FACP, [facility]. [facility] Pager [phone]  If 7PM-7AM, please contact night-coverage [email or web address] Password [email or web address] 02/18/2018, 1:58 PM

## 2018-02-18 NOTE — Progress Notes (Signed)
Report given to RN at [facility]. All questions answered and discharge instructions reviewed with nurse.

## 2018-02-18 NOTE — Discharge Summary (Signed)
Physician Discharge Summary  DRESHAWN HENDERSHOTT WVP:[id number] DOB: 01-Dec-1950 DOA: 02/13/2018  PCP: [name], MD  Admit date: 02/13/2018 Discharge date: 02/18/2018  Admitted From: home Disposition:  SNF  Recommendations for Outpatient Follow-up:  1. MD at SNF in 2 days with repeat labs (CBC, BMP, Mg, PT & INR).  Adjust Coumadin dose as needed. 2. Dr. [name], PCP upon discharge from SNF. 3. Follow up at the wound care clinic as an outpatient. SNF to coordinate. 4. Consider outpatient Cardiology consultation and follow-up. 5. Recommend repeating urine microscopy in 2 to 3 weeks.  Home Health: N/A Equipment/Devices: N/A  Discharge Condition: Improved and stable. CODE STATUS: Full  Diet recommendation: Heart healthy & diabetic diet.  Dressing changes:  As per [place] RN recommendations as follows:  Dressing procedure/placement/frequency: Cleanse left heel and right ischium with NS and pat dry.  Apply Santyl to wound bed. COver with 4x4 gauze and kerlix/tape. ABD to ischial wound. Change daily.  Offload right heel.  Bilateral prevalon boots.    Brief/Interim Summary: 67 year old married male, nonambulatory for approximately 9 months and mobile with the help of a wheelchair, PMH of DM 2/IDDM, HTN, chronic combined systolic and diastolic CHF, pacemaker, A. fib on Coumadin, OHS/?  OSA but reports not on CPAP, presented to the ED due to worsening left heel and sacral wound after debridement of left heel wound 2 days prior on 5/8 at the wound care center and no antibiotics commenced at that time.  Wife was dressing his wounds and noticed some purulence, called his PCP and advised to come to the ED.  He apparently developed sacral wound when he was at [facility] 3 weeks prior.    Discharge Diagnoses:  Active Problems:   Diabetes mellitus, insulin dependent (IDDM), uncontrolled (HCC)   Obesity, Class III, BMI 40-49.9 (morbid obesity) (HCC)   Essential hypertension   Chronic combined  systolic and diastolic CHF (congestive heart failure) (HCC)   Atrial fibrillation/flutter   Hyperkalemia   Severe sepsis (HCC)   Chronic respiratory failure (HCC)   Cellulitis of left lower extremity   Decubitus ulcer of sacral region, stage 3 (HCC)   Chronic ulcer of left heel (HCC)   CKD (chronic kidney disease), stage III (HCC)  Severe sepsis secondary to infected left heel ulcer with left foot (Proteus mirabilis and MRSA)/leg cellulitis: He received IV antibiotics from 5/10-5/14 (single dose of Ancef and cefepime followed by IV ceftriaxone and vancomycin).  Wound care was consulted.  Clinically improved.  Cellulitis has resolved.  At discharge, based on culture sensitivity results, transitioned to oral Augmentin and doxycycline to complete total 7 days on 02/24/2018.  Blood cultures x2: Negative to date.  Imaging studies were negative for osteomyelitis.  Sacral decubitus ulcer: Follow dressing changes as per wound care team and antibiotic management as above.  As per wound care team, patient had right ischial tuberosity wound.  Kidney disease stage III: Stable.  Periodically follow BMP as outpatient.  Chronic combined systolic and diastolic CHF Clinically compensated.  Patient on multiple diuretics including Lasix, Zaroxolyn and Aldactone.  He is on BiDil as well.  Consider outpatient cardiology consultation and follow-up.  TTE 01/28/2016: LVEF 30-35%.  A. fib with a chads score greater than 4: Rate controlled and anticoagulated.  Continue carvedilol.  Monitor PT/INR closely and adjust Coumadin dose as needed.  Uncontrolled diabetes mellitus type II with peripheral neuropathy: He was started on Levemir in the hospital.  Continue Levemir and SSI and adjust based on close  CBG monitoring.  A1c 10 suggest very poor outpatient control.  Obesity:  Essential hypertension: Continue current regimen including carvedilol, BiDil.  Controlled.  Hypokalemia Replaced.  Follow BMP closely.   Patient is on multiple diuretics including Aldactone and oral potassium supplements.  Hypomagnesemia:  Replaced IV prior to discharge.  Follow magnesium in a couple days.  Lower extremity weakness Unclear etiology.  Patient reports having seen multiple neurologists in the past in [place] in [place].  Has intermittent involuntary lower extremity spasms.  May consider outpatient neurology follow-up.    Microscopic hematuria Unclear etiology.  Recommend repeating urine microscopy in 2 to 3 weeks and if persistent needs further evaluation and may involve urology consultation.  Discharge Instructions  Discharge Instructions    (HEART FAILURE PATIENTS) Call MD:  Anytime you have any of the following symptoms: 1) 3 pound weight gain in 24 hours or 5 pounds in 1 week 2) shortness of breath, with or without a dry hacking cough 3) swelling in the hands, feet or stomach 4) if you have to sleep on extra pillows at night in order to breathe.   Complete by:  As directed    Call MD for:  difficulty breathing, headache or visual disturbances   Complete by:  As directed    Call MD for:  extreme fatigue   Complete by:  As directed    Call MD for:  persistant dizziness or light-headedness   Complete by:  As directed    Call MD for:  persistant nausea and vomiting   Complete by:  As directed    Call MD for:  redness, tenderness, or signs of infection (pain, swelling, redness, odor or green/yellow discharge around incision site)   Complete by:  As directed    Call MD for:  severe uncontrolled pain   Complete by:  As directed    Call MD for:  temperature >100.4   Complete by:  As directed    Diet - low sodium heart healthy   Complete by:  As directed    Diet Carb Modified   Complete by:  As directed    Discharge wound care:   Complete by:  As directed    As per [place] RN recommendations as follows:  Dressing procedure/placement/frequency: Cleanse left heel and right ischium with NS and pat dry.   Apply Santyl to wound bed. COver with 4x4 gauze and kerlix/tape. ABD to ischial wound. Change daily.  Offload right heel.  Bilateral prevalon boots.   Increase activity slowly   Complete by:  As directed      Allergies as of 02/18/2018   No Known Allergies     Medication List    TAKE these medications   amoxicillin-clavulanate 875-125 MG tablet Commonly known as:  AUGMENTIN Take 1 tablet by mouth every 12 (twelve) hours for 7 days.   carvedilol 25 MG tablet Commonly known as:  COREG Take 25 mg by mouth 2 (two) times daily.   collagenase ointment Commonly known as:  SANTYL Apply topically daily. Apply to left heel and right ischial wounds Start taking on:  02/19/2018   cyclobenzaprine 5 MG tablet Commonly known as:  FLEXERIL Take 5 mg by mouth 3 (three) times daily.   doxycycline 100 MG tablet Commonly known as:  ADOXA Take 1 tablet (100 mg total) by mouth 2 (two) times daily.   furosemide 80 MG tablet Commonly known as:  LASIX Take 80 mg by mouth every other day.   gabapentin 300 MG capsule  Commonly known as:  NEURONTIN Take 300 mg by mouth every morning.   insulin detemir 100 UNIT/ML injection Commonly known as:  LEVEMIR Inject 0.12 mLs (12 Units total) into the skin 2 (two) times daily.   insulin lispro 100 UNIT/ML injection Commonly known as:  HUMALOG Inject 0-0.09 mLs (0-9 Units total) into the skin 3 (three) times daily with meals. CBG < 70: implement hypoglycemia protocol CBG 70 - 120: 0 units CBG 121 - 150: 1 unit CBG 151 - 200: 2 units CBG 201 - 250: 3 units CBG 251 - 300: 5 units CBG 301 - 350: 7 units CBG 351 - 400: 9 units CBG > 400: call MD. What changed:    how much to take  when to take this  additional instructions   isosorbide-hydrALAZINE 20-37.5 MG tablet Commonly known as:  BIDIL Take 1 tablet by mouth 2 (two) times daily.   KLOR-CON M20 20 MEQ tablet Generic drug:  potassium chloride SA Take 20 mg by mouth 2 (two) times daily.    metolazone 2.5 MG tablet Commonly known as:  ZAROXOLYN Take 2.5 mg by mouth daily.   OVER THE COUNTER MEDICATION Take 1 tablet by mouth daily. VITAMIN B-9 WITH FOLIC ACID   oxyCODONE-acetaminophen 5-325 MG tablet Commonly known as:  PERCOCET/ROXICET Take one tablet by mouth every four hours for pain. Do not exceed 4gm of Tylenol in 24 hours   rosuvastatin 10 MG tablet Commonly known as:  CRESTOR Take 10 mg by mouth daily.   spironolactone 25 MG tablet Commonly known as:  ALDACTONE Take 1 tablet (25 mg total) by mouth 2 (two) times daily.   warfarin 7.5 MG tablet Commonly known as:  COUMADIN Take 7.5 mg by mouth daily.            Discharge Care Instructions  (From admission, onward)        Start     Ordered   02/18/18 0000  Discharge wound care:    Comments:  As per [place] RN recommendations as follows:  Dressing procedure/placement/frequency: Cleanse left heel and right ischium with NS and pat dry.  Apply Santyl to wound bed. COver with 4x4 gauze and kerlix/tape. ABD to ischial wound. Change daily.  Offload right heel.  Bilateral prevalon boots.   02/18/18 1411      Contact information for follow-up providers    MD at SNF. Schedule an appointment as soon as possible for a visit on 02/20/2018.   Why:  To be seen with repeat labs (CBC, BMP, Mg, PT & INR).  Adjust Coumadin dose as needed.       [name], MD. Schedule an appointment as soon as possible for a visit.   Specialty:  Internal Medicine Why:  Upon discharge from SNF. Contact information: [place] [address] [phone]            Contact information for after-discharge care    Destination    [facility] .   Service:  Skilled Nursing Contact information: [place] [phone]                 No Known Allergies  Consultations:  None   Procedures/Studies: Dg Chest 1 View  Result Date:  02/13/2018 CLINICAL DATA:  67 year old male with with hyperglycemia, decreased p.o. intake. Recent surgery for debridement of a foot/heel wound. EXAM: CHEST  1 VIEW COMPARISON:  Portable chest radiographs 01/26/2016 and earlier. FINDINGS: Semi upright AP and  lateral views of the chest. Chronic cardiomegaly and left chest cardiac pacemaker. Lordotic AP view today. Stable lung volumes. No pneumothorax, pulmonary edema, pleural effusion or confluent pulmonary opacity identified. No acute osseous abnormality identified. Paucity bowel gas in the upper abdomen. IMPRESSION: Stable cardiomegaly. No acute cardiopulmonary abnormality. Electronically Signed   By: Genevie Ann M.D.   On: 02/13/2018 16:55   Ct Pelvis Wo Contrast  Result Date: 02/13/2018 CLINICAL DATA:  Soft tissue swelling. Concern for sacral decubitus ulcer or abscess. EXAM: CT PELVIS WITHOUT CONTRAST TECHNIQUE: Multidetector CT imaging of the pelvis was performed following the standard protocol without intravenous contrast. COMPARISON:  CT abdomen pelvis 11/10/2017 FINDINGS: Urinary Tract:  No abnormality visualized. Bowel:  Unremarkable visualized pelvic bowel loops. Vascular/Lymphatic: Atherosclerotic calcification of the aorta and iliac arteries. Reproductive:  No mass or other significant abnormality Other:  None. Musculoskeletal: No suspicious bone lesions identified. There is no sacral decubitus ulcer or subcutaneous lesion. No abscess or fluid collection. IMPRESSION: No sacral decubitus ulcer or abscess. Aortic Atherosclerosis (ICD10-I70.0). Electronically Signed   By: Ulyses Jarred M.D.   On: 02/13/2018 17:14   Dg Foot Complete Left  Result Date: 02/13/2018 CLINICAL DATA:  Heel ulcer and foot pain EXAM: LEFT FOOT - COMPLETE 3+ VIEW COMPARISON:  Left foot radiograph 02/11/2018 FINDINGS: Diffuse osteopenia. No acute fracture or dislocation. Previously seen heel ulcer is not visible due to positioning. No visible osteolysis. IMPRESSION: Allowing for  differences in positioning, unchanged radiographs of the left foot without osseous acute abnormality. Electronically Signed   By: Ulyses Jarred M.D.   On: 02/13/2018 16:55   Dg Foot Complete Left  Result Date: 02/12/2018 CLINICAL DATA:  Left heel pressure ulcer. EXAM: LEFT FOOT - COMPLETE 3+ VIEW COMPARISON:  None. FINDINGS: Plantar ulceration of the hindfoot. No osseous destruction. No acute fracture or dislocation. Mild degenerative changes of the foot. Diffuse osteopenia. IMPRESSION: Plantar ulceration of the hindfoot.  No acute osseous abnormality. Electronically Signed   By: Titus Dubin M.D.   On: 02/12/2018 09:25   Ct Extremity Lower Left Wo Contrast  Result Date: 02/13/2018 CLINICAL DATA:  LEFT heel ulcer, sacral ulcer, atrial fibrillation, CHF, diabetes mellitus, fever, CHF, former smoker EXAM: CT OF THE LOWER LEFT EXTREMITY WITHOUT CONTRAST TECHNIQUE: Multidetector CT imaging of the lower left extremity was performed according to the standard protocol. Sagittal and coronal MPR images reconstructed from axial data set. COMPARISON:  CT pelvis 02/13/2018, FINDINGS: Bones/Joint/Cartilage Osseous demineralization. Visualized pelvis intact. Degenerative changes of LEFT hip joint with mild joint space narrowing and spur formation. Degenerative changes are also present at the LEFT knee joint with joint space narrowing, minimal spurring, chondrocalcinosis, and associated knee joint effusion. No fracture or dislocation. No bone destruction identified to suggest osteomyelitis. Mild scattered degenerative changes at intertarsal joints and at ankle joint. Ligaments Suboptimally assessed by CT. Muscles and Tendons Tendons suboptimally assessed by CT. Fatty atrophy of the medial LEFT gastrocnemius muscle. Muscular planes appear atrophic but otherwise grossly normal appearance. Soft tissues Skin thickening and subcutaneous edema at the posteromedial LEFT heel region likely representing patient's heel ulcer. Soft  tissue swelling extends into the medial soft tissues at the plantar arch. LEFT knee joint effusion. No definite hip joint effusion. Scattered atherosclerotic calcifications. No abnormal fluid collections to suggest abscess. Multiple normal to upper normal sized LEFT inguinal lymph nodes. LEFT inguinal hernia containing fat. Intrapelvic structures otherwise unremarkable. IMPRESSION: No CT evidence of bone destruction adjacent to the posteromedial heel ulcer to suggest osteomyelitis. Scattered degenerative  changes at LEFT hip, knee, and ankle/foot with associated LEFT knee joint effusion. Fatty atrophy of medial LEFT gastrocnemius muscle. Small LEFT inguinal hernia containing fat. Electronically Signed   By: Lavonia Dana M.D.   On: 02/13/2018 17:45    Subjective: He reports that he has been nonambulatory for approximately 9 months and moves around with the help of a wheelchair.  He has seen at least 3 different neurologists for his lower extremity weakness without clear diagnosis.  He reports intermittent lower extremity involuntary muscle spasms.  He underwent I&D of left heel wound recently.  Currently denies complaints.  No pain, fever or chills.  Tolerated diet.  As per RN, no acute issues reported.  Discharge Exam: Vitals:   02/18/18 0646 02/18/18 0934  BP: 132/67 138/85  Pulse: 67 69  Resp: 20 18  Temp: 98.3 F (36.8 C)   SpO2: 93%    Vitals:   02/17/18 1744 02/17/18 2144 02/18/18 0646 02/18/18 0934  BP: (!) 141/80 130/76 132/67 138/85  Pulse: 69 69 67 69  Resp:  18 20 18   Temp:  97.9 F (36.6 C) 98.3 F (36.8 C)   TempSrc:  Oral Oral   SpO2:  95% 93%   Weight:      Height:        RS: Clear to auscultation.  No increased work of breathing.   CVS: S1 and S2 heard, RRR.  No JVD, murmurs or pedal edema. Gastrointestinal system: Abdomen is obese/nondistended.  Laparotomy scars present.  Soft and nontender.  No organomegaly or masses appreciated.  Normal bowel sounds heard. CNS: Alert  and oriented.  No cranial nerve deficits. Extremities: No cyanosis clubbing or edema.  At least grade 3 x 5 power in both lower extremities and grade 5 x 5 power in upper extremities.  Approximately 4 x 3 cm wound over left heel with no active drainage, patchy minimal slough, no bleeding and no acute findings suggestive of cellulitis.  Right heel with approximately 1 cm diameter maroon discoloration (appears like a resolving hemorrhagic blister).      The results of significant diagnostics from this hospitalization (including imaging, microbiology, ancillary and laboratory) are listed below for reference.     Microbiology: Recent Results (from the past 240 hour(s))  Aerobic Culture (superficial specimen)     Status: None   Collection Time: 02/11/18  3:20 PM  Result Value Ref Range Status   Specimen Description   Final    WOUND LEFT CALCANEUS Performed at [facility] [address] [place], [place] [address]    Special Requests   Final    NONE Performed at [facility], [place] [address], [place], [place] [address]    Gram Stain   Final    RARE WBC PRESENT, PREDOMINANTLY PMN RARE GRAM NEGATIVE RODS    Culture   Final    MODERATE METHICILLIN RESISTANT STAPHYLOCOCCUS AUREUS MODERATE PROTEUS MIRABILIS    Report Status 02/15/2018 FINAL  Final   Organism ID, Bacteria PROTEUS MIRABILIS  Final   Organism ID, Bacteria METHICILLIN RESISTANT STAPHYLOCOCCUS AUREUS  Final      Susceptibility   Methicillin resistant staphylococcus aureus - MIC*    CIPROFLOXACIN >=8 RESISTANT Resistant     ERYTHROMYCIN >=8 RESISTANT Resistant     GENTAMICIN <=0.5 SENSITIVE Sensitive     OXACILLIN >=4 RESISTANT Resistant     TETRACYCLINE <=1 SENSITIVE Sensitive     VANCOMYCIN 1 SENSITIVE Sensitive     TRIMETH/SULFA >=320 RESISTANT Resistant  CLINDAMYCIN <=0.25 SENSITIVE Sensitive     RIFAMPIN <=0.5 SENSITIVE Sensitive     Inducible Clindamycin NEGATIVE Sensitive      * MODERATE METHICILLIN RESISTANT STAPHYLOCOCCUS AUREUS   Proteus mirabilis - MIC*    AMPICILLIN <=2 SENSITIVE Sensitive     CEFAZOLIN <=4 SENSITIVE Sensitive     CEFEPIME <=1 SENSITIVE Sensitive     CEFTAZIDIME <=1 SENSITIVE Sensitive     CEFTRIAXONE <=1 SENSITIVE Sensitive     CIPROFLOXACIN >=4 RESISTANT Resistant     GENTAMICIN <=1 SENSITIVE Sensitive     IMIPENEM 4 SENSITIVE Sensitive     TRIMETH/SULFA <=20 SENSITIVE Sensitive     AMPICILLIN/SULBACTAM <=2 SENSITIVE Sensitive     PIP/TAZO <=4 SENSITIVE Sensitive     * MODERATE PROTEUS MIRABILIS  Culture, blood (routine x 2)     Status: None   Collection Time: 02/13/18  2:40 PM  Result Value Ref Range Status   Specimen Description   Final    BLOOD RIGHT FOREARM Performed at [facility] [address] [place], [place] [address]    Special Requests   Final    BOTTLES DRAWN AEROBIC ONLY Blood Culture results may not be optimal due to an inadequate volume of blood received in culture bottles Performed at [facility] [address], [place], [place] [address]    Culture   Final    NO GROWTH 5 DAYS Performed at [facility], [place] [address] [place], [place] [address]    Report Status 02/18/2018 FINAL  Final  Culture, blood (routine x 2)     Status: None   Collection Time: 02/13/18  2:54 PM  Result Value Ref Range Status   Specimen Description   Final    BLOOD LEFT ANTECUBITAL Performed at [facility] [address] [place], [place] [address]    Special Requests   Final    BOTTLES DRAWN AEROBIC AND ANAEROBIC Blood Culture adequate volume Performed at [facility] [address] [place], [place] [address]    Culture   Final    NO GROWTH 5 DAYS Performed at [facility], [place] [address], [place], [place] [address]    Report Status 02/18/2018 FINAL  Final     Labs: BNP (last 3 results) No results for input(s): BNP in the last  8760 hours. Basic Metabolic Panel: Recent Labs  Lab 02/13/18 1454 02/14/18 0510 02/15/18 0419 02/16/18 0438 02/17/18 0437 02/18/18 0438  NA  --  136 134* 136 135 135  K  --  2.3* 2.8* 2.6* 2.9* 4.2  CL  --  88* 90* 92* 92* 96*  CO2  --  32 29 29 29 28   GLUCOSE  --  257* 264* 257* 190* 286*  BUN  --  50* 39* 36* 30* 28*  CREATININE  --  2.23* 1.76* 2.02* 1.68* 1.86*  CALCIUM  --  8.6* 8.3* 8.3* 8.4* 8.5*  MG 1.5*  --   --   --   --  1.4*   Liver Function Tests: Recent Labs  Lab 02/14/18 0510  AST 17  ALT 16*  ALKPHOS 24*  BILITOT 0.6  PROT 7.1  ALBUMIN 2.5*   CBC: Recent Labs  Lab 02/13/18 1131 02/14/18 0510 02/18/18 0438  WBC 10.1 11.3* 9.9  HGB 14.9 14.1 12.8*  HCT 44.3 42.7 39.1  MCV 92.7 92.2 93.8  PLT 240 257 251   CBG: Recent Labs  Lab 02/17/18 1609 02/17/18 1748  02/17/18 2202 02/18/18 0823 02/18/18 1146  GLUCAP 281* 222* 220* 255* 209*   Urinalysis    Component Value Date/Time   COLORURINE YELLOW 02/13/2018 1658   APPEARANCEUR TURBID (A) 02/13/2018 1658   LABSPEC 1.010 02/13/2018 1658   PHURINE 5.0 02/13/2018 1658   GLUCOSEU >=500 (A) 02/13/2018 1658   HGBUR MODERATE (A) 02/13/2018 1658   BILIRUBINUR NEGATIVE 02/13/2018 1658   KETONESUR NEGATIVE 02/13/2018 1658   PROTEINUR 100 (A) 02/13/2018 1658   UROBILINOGEN 0.2 11/13/2013 0630   NITRITE NEGATIVE 02/13/2018 1658   LEUKOCYTESUR LARGE (A) 02/13/2018 1658   Sepsis Labs Invalid input(s): PROCALCITONIN,  WBC,  LACTICIDVEN Microbiology Recent Results (from the past 240 hour(s))  Aerobic Culture (superficial specimen)     Status: None   Collection Time: 02/11/18  3:20 PM  Result Value Ref Range Status   Specimen Description   Final    WOUND LEFT CALCANEUS Performed at [facility], [place] [address] [place], [place] [address]    Special Requests   Final    NONE Performed at [facility], [place] [address] [place], [place] [address]    Gram Stain    Final    RARE WBC PRESENT, PREDOMINANTLY PMN RARE GRAM NEGATIVE RODS    Culture   Final    MODERATE METHICILLIN RESISTANT STAPHYLOCOCCUS AUREUS MODERATE PROTEUS MIRABILIS    Report Status 02/15/2018 FINAL  Final   Organism ID, Bacteria PROTEUS MIRABILIS  Final   Organism ID, Bacteria METHICILLIN RESISTANT STAPHYLOCOCCUS AUREUS  Final      Susceptibility   Methicillin resistant staphylococcus aureus - MIC*    CIPROFLOXACIN >=8 RESISTANT Resistant     ERYTHROMYCIN >=8 RESISTANT Resistant     GENTAMICIN <=0.5 SENSITIVE Sensitive     OXACILLIN >=4 RESISTANT Resistant     TETRACYCLINE <=1 SENSITIVE Sensitive     VANCOMYCIN 1 SENSITIVE Sensitive     TRIMETH/SULFA >=320 RESISTANT Resistant     CLINDAMYCIN <=0.25 SENSITIVE Sensitive     RIFAMPIN <=0.5 SENSITIVE Sensitive     Inducible Clindamycin NEGATIVE Sensitive     * MODERATE METHICILLIN RESISTANT STAPHYLOCOCCUS AUREUS   Proteus mirabilis - MIC*    AMPICILLIN <=2 SENSITIVE Sensitive     CEFAZOLIN <=4 SENSITIVE Sensitive     CEFEPIME <=1 SENSITIVE Sensitive     CEFTAZIDIME <=1 SENSITIVE Sensitive     CEFTRIAXONE <=1 SENSITIVE Sensitive     CIPROFLOXACIN >=4 RESISTANT Resistant     GENTAMICIN <=1 SENSITIVE Sensitive     IMIPENEM 4 SENSITIVE Sensitive     TRIMETH/SULFA <=20 SENSITIVE Sensitive     AMPICILLIN/SULBACTAM <=2 SENSITIVE Sensitive     PIP/TAZO <=4 SENSITIVE Sensitive     * MODERATE PROTEUS MIRABILIS  Culture, blood (routine x 2)     Status: None   Collection Time: 02/13/18  2:40 PM  Result Value Ref Range Status   Specimen Description   Final    BLOOD RIGHT FOREARM Performed at [facility] [address] [place], [place] [address]    Special Requests   Final    BOTTLES DRAWN AEROBIC ONLY Blood Culture results may not be optimal due to an inadequate volume of blood received in culture bottles Performed at [facility] [address], [place], [place] [address]    Culture    Final    NO GROWTH 5 DAYS Performed at [facility], [place] [address], [place], [place] [address]    Report Status 02/18/2018 FINAL  Final  Culture, blood (routine x 2)     Status: None   Collection Time: 02/13/18  2:54 PM  Result Value Ref Range Status   Specimen Description   Final    BLOOD LEFT ANTECUBITAL Performed at [facility] [address], [place], [place] [address]    Special Requests   Final    BOTTLES DRAWN AEROBIC AND ANAEROBIC Blood Culture adequate volume Performed at [facility] [address] [place], [place] [address]    Culture   Final    NO GROWTH 5 DAYS Performed at [facility], [place] [address] [place], [place] [address]    Report Status 02/18/2018 FINAL  Final     Time coordinating discharge: 45 minutes  SIGNED:   Vernell Leep, MD, FACP, [name]. [facility] Pager [phone]  If 7PM-7AM, please contact night-coverage [email or web address] Password [facility] 02/18/2018, 2:33 PM

## 2018-02-18 NOTE — Progress Notes (Signed)
ANTICOAGULATION CONSULT NOTE - Follow Up  Pharmacy Consult for Warfarin Indication: hx atrial fibrillation/flutter  No Known Allergies  Patient Measurements: Height: 6' (182.9 cm) Weight: 283 lb (128.4 kg) IBW/kg (Calculated) : 77.6   Vital Signs: Temp: 98.3 F (36.8 C) (05/15 0646) Temp Source: Oral (05/15 0646) BP: 132/67 (05/15 0646) Pulse Rate: 67 (05/15 0646)  Labs: Recent Labs    02/16/18 0438 02/17/18 0437 02/18/18 0438  HGB  --   --  12.8*  HCT  --   --  39.1  PLT  --   --  251  LABPROT 25.2* 26.9* 23.4*  INR 2.31 2.51 2.11  CREATININE 2.02* 1.68*  --     Estimated Creatinine Clearance: 61.5 mL/min (A) (by C-G formula based on SCr of 1.68 mg/dL (H)).  Assessment: Patient's a 67 y.o M on chronic warfarin therapy for a-fib/a-flutter admitted to [facility] on 02/13/2018 for foot wound. Pharmacy consulted to manage warfarin dosing while patient in the hospital.   - home warfarin regimen: 7.5 mg daily  Today, 02/18/18  INR 2.11 remains therapeutic  Last cbc on 5/15 stable; Hgb down to 12.8, Plt 251  No bleeding documented  Being on abx can make patient more sensitive to warfarin (Currently on vancomycin and ceftriaxone inpatient.  Discharge antibiotics, Augmentin and Doxy are expected to have only minor drug-drug interaction with warfarin.)  Goal of Therapy:  INR 2-3 Monitor platelets by anticoagulation protocol: Yes   Plan:  - Resume home dose, warfarin 7.5 mg at 18:00 x1 dose - If discharged today, recommend to resume home dose 7.5 mg daily. Recommend to recheck INR no later than 5/17 if possible. - Daily INR - monitor for s/s bleeding  Gretta Arab PharmD, BCPS Pager [phone] 02/18/2018 8:23 AM

## 2018-02-18 NOTE — Discharge Instructions (Signed)

## 2018-02-19 DIAGNOSIS — I509 Heart failure, unspecified: Secondary | ICD-10-CM | POA: Diagnosis not present

## 2018-02-19 DIAGNOSIS — E8809 Other disorders of plasma-protein metabolism, not elsewhere classified: Secondary | ICD-10-CM | POA: Diagnosis not present

## 2018-02-19 DIAGNOSIS — I4891 Unspecified atrial fibrillation: Secondary | ICD-10-CM | POA: Diagnosis not present

## 2018-02-19 DIAGNOSIS — Z7901 Long term (current) use of anticoagulants: Secondary | ICD-10-CM | POA: Diagnosis not present

## 2018-02-20 DIAGNOSIS — L97429 Non-pressure chronic ulcer of left heel and midfoot with unspecified severity: Secondary | ICD-10-CM | POA: Diagnosis not present

## 2018-02-20 DIAGNOSIS — E119 Type 2 diabetes mellitus without complications: Secondary | ICD-10-CM | POA: Diagnosis not present

## 2018-02-20 DIAGNOSIS — I4891 Unspecified atrial fibrillation: Secondary | ICD-10-CM | POA: Diagnosis not present

## 2018-02-24 DIAGNOSIS — Z7901 Long term (current) use of anticoagulants: Secondary | ICD-10-CM | POA: Diagnosis not present

## 2018-02-24 DIAGNOSIS — M62838 Other muscle spasm: Secondary | ICD-10-CM | POA: Diagnosis not present

## 2018-02-25 DIAGNOSIS — J40 Bronchitis, not specified as acute or chronic: Secondary | ICD-10-CM | POA: Diagnosis not present

## 2018-02-26 DIAGNOSIS — J40 Bronchitis, not specified as acute or chronic: Secondary | ICD-10-CM | POA: Diagnosis not present

## 2018-02-26 DIAGNOSIS — N183 Chronic kidney disease, stage 3 (moderate): Secondary | ICD-10-CM | POA: Diagnosis not present

## 2018-02-26 DIAGNOSIS — L89313 Pressure ulcer of right buttock, stage 3: Secondary | ICD-10-CM | POA: Diagnosis not present

## 2018-02-26 DIAGNOSIS — E10621 Type 1 diabetes mellitus with foot ulcer: Secondary | ICD-10-CM | POA: Diagnosis not present

## 2018-02-26 DIAGNOSIS — L89623 Pressure ulcer of left heel, stage 3: Secondary | ICD-10-CM | POA: Diagnosis not present

## 2018-02-27 DIAGNOSIS — Z7901 Long term (current) use of anticoagulants: Secondary | ICD-10-CM | POA: Diagnosis not present

## 2018-03-02 DIAGNOSIS — G4700 Insomnia, unspecified: Secondary | ICD-10-CM | POA: Diagnosis not present

## 2018-03-03 DIAGNOSIS — E119 Type 2 diabetes mellitus without complications: Secondary | ICD-10-CM | POA: Diagnosis not present

## 2018-03-03 DIAGNOSIS — R253 Fasciculation: Secondary | ICD-10-CM | POA: Diagnosis not present

## 2018-03-04 DIAGNOSIS — E119 Type 2 diabetes mellitus without complications: Secondary | ICD-10-CM | POA: Diagnosis not present

## 2018-03-06 DIAGNOSIS — I509 Heart failure, unspecified: Secondary | ICD-10-CM | POA: Diagnosis not present

## 2018-03-06 DIAGNOSIS — E119 Type 2 diabetes mellitus without complications: Secondary | ICD-10-CM | POA: Diagnosis not present

## 2018-03-06 DIAGNOSIS — N183 Chronic kidney disease, stage 3 (moderate): Secondary | ICD-10-CM | POA: Diagnosis not present

## 2018-03-09 DIAGNOSIS — I4891 Unspecified atrial fibrillation: Secondary | ICD-10-CM | POA: Diagnosis not present

## 2018-03-13 DIAGNOSIS — E119 Type 2 diabetes mellitus without complications: Secondary | ICD-10-CM | POA: Diagnosis not present

## 2018-03-13 DIAGNOSIS — Z7901 Long term (current) use of anticoagulants: Secondary | ICD-10-CM | POA: Diagnosis not present

## 2018-03-16 DIAGNOSIS — I4891 Unspecified atrial fibrillation: Secondary | ICD-10-CM | POA: Diagnosis not present

## 2018-03-19 DIAGNOSIS — R109 Unspecified abdominal pain: Secondary | ICD-10-CM | POA: Diagnosis not present

## 2018-03-26 DIAGNOSIS — L089 Local infection of the skin and subcutaneous tissue, unspecified: Secondary | ICD-10-CM | POA: Diagnosis not present

## 2018-03-26 DIAGNOSIS — A419 Sepsis, unspecified organism: Secondary | ICD-10-CM | POA: Diagnosis not present

## 2018-03-26 DIAGNOSIS — M6281 Muscle weakness (generalized): Secondary | ICD-10-CM | POA: Diagnosis not present

## 2018-03-26 DIAGNOSIS — R278 Other lack of coordination: Secondary | ICD-10-CM | POA: Diagnosis not present

## 2018-03-26 DIAGNOSIS — I509 Heart failure, unspecified: Secondary | ICD-10-CM | POA: Diagnosis not present

## 2018-03-26 DIAGNOSIS — I5040 Unspecified combined systolic (congestive) and diastolic (congestive) heart failure: Secondary | ICD-10-CM | POA: Diagnosis not present

## 2018-03-26 DIAGNOSIS — R262 Difficulty in walking, not elsewhere classified: Secondary | ICD-10-CM | POA: Diagnosis not present

## 2018-03-30 DIAGNOSIS — I5040 Unspecified combined systolic (congestive) and diastolic (congestive) heart failure: Secondary | ICD-10-CM | POA: Diagnosis not present

## 2018-03-30 DIAGNOSIS — M6281 Muscle weakness (generalized): Secondary | ICD-10-CM | POA: Diagnosis not present

## 2018-03-30 DIAGNOSIS — I509 Heart failure, unspecified: Secondary | ICD-10-CM | POA: Diagnosis not present

## 2018-03-30 DIAGNOSIS — L089 Local infection of the skin and subcutaneous tissue, unspecified: Secondary | ICD-10-CM | POA: Diagnosis not present

## 2018-03-30 DIAGNOSIS — R262 Difficulty in walking, not elsewhere classified: Secondary | ICD-10-CM | POA: Diagnosis not present

## 2018-03-30 DIAGNOSIS — R278 Other lack of coordination: Secondary | ICD-10-CM | POA: Diagnosis not present

## 2018-03-30 DIAGNOSIS — A419 Sepsis, unspecified organism: Secondary | ICD-10-CM | POA: Diagnosis not present

## 2018-03-31 DIAGNOSIS — L89623 Pressure ulcer of left heel, stage 3: Secondary | ICD-10-CM | POA: Diagnosis not present

## 2018-03-31 DIAGNOSIS — R278 Other lack of coordination: Secondary | ICD-10-CM | POA: Diagnosis not present

## 2018-03-31 DIAGNOSIS — R262 Difficulty in walking, not elsewhere classified: Secondary | ICD-10-CM | POA: Diagnosis not present

## 2018-03-31 DIAGNOSIS — M6281 Muscle weakness (generalized): Secondary | ICD-10-CM | POA: Diagnosis not present

## 2018-03-31 DIAGNOSIS — L89312 Pressure ulcer of right buttock, stage 2: Secondary | ICD-10-CM | POA: Diagnosis not present

## 2018-03-31 DIAGNOSIS — A419 Sepsis, unspecified organism: Secondary | ICD-10-CM | POA: Diagnosis not present

## 2018-03-31 DIAGNOSIS — I5040 Unspecified combined systolic (congestive) and diastolic (congestive) heart failure: Secondary | ICD-10-CM | POA: Diagnosis not present

## 2018-03-31 DIAGNOSIS — I509 Heart failure, unspecified: Secondary | ICD-10-CM | POA: Diagnosis not present

## 2018-03-31 DIAGNOSIS — L089 Local infection of the skin and subcutaneous tissue, unspecified: Secondary | ICD-10-CM | POA: Diagnosis not present

## 2018-04-02 DIAGNOSIS — R6889 Other general symptoms and signs: Secondary | ICD-10-CM | POA: Diagnosis not present

## 2018-04-02 DIAGNOSIS — L8995 Pressure ulcer of unspecified site, unstageable: Secondary | ICD-10-CM | POA: Diagnosis not present

## 2018-04-02 DIAGNOSIS — L089 Local infection of the skin and subcutaneous tissue, unspecified: Secondary | ICD-10-CM | POA: Diagnosis not present

## 2018-04-02 DIAGNOSIS — M25561 Pain in right knee: Secondary | ICD-10-CM | POA: Diagnosis not present

## 2018-04-02 DIAGNOSIS — I483 Typical atrial flutter: Secondary | ICD-10-CM | POA: Diagnosis not present

## 2018-04-03 DIAGNOSIS — R278 Other lack of coordination: Secondary | ICD-10-CM | POA: Diagnosis not present

## 2018-04-03 DIAGNOSIS — I5040 Unspecified combined systolic (congestive) and diastolic (congestive) heart failure: Secondary | ICD-10-CM | POA: Diagnosis not present

## 2018-04-03 DIAGNOSIS — I509 Heart failure, unspecified: Secondary | ICD-10-CM | POA: Diagnosis not present

## 2018-04-03 DIAGNOSIS — M6281 Muscle weakness (generalized): Secondary | ICD-10-CM | POA: Diagnosis not present

## 2018-04-03 DIAGNOSIS — L089 Local infection of the skin and subcutaneous tissue, unspecified: Secondary | ICD-10-CM | POA: Diagnosis not present

## 2018-04-03 DIAGNOSIS — R262 Difficulty in walking, not elsewhere classified: Secondary | ICD-10-CM | POA: Diagnosis not present

## 2018-04-03 DIAGNOSIS — A419 Sepsis, unspecified organism: Secondary | ICD-10-CM | POA: Diagnosis not present

## 2018-04-07 DIAGNOSIS — I130 Hypertensive heart and chronic kidney disease with heart failure and stage 1 through stage 4 chronic kidney disease, or unspecified chronic kidney disease: Secondary | ICD-10-CM | POA: Diagnosis not present

## 2018-04-07 DIAGNOSIS — I4891 Unspecified atrial fibrillation: Secondary | ICD-10-CM | POA: Diagnosis not present

## 2018-04-07 DIAGNOSIS — R29898 Other symptoms and signs involving the musculoskeletal system: Secondary | ICD-10-CM | POA: Diagnosis not present

## 2018-04-07 DIAGNOSIS — E1122 Type 2 diabetes mellitus with diabetic chronic kidney disease: Secondary | ICD-10-CM | POA: Diagnosis not present

## 2018-04-07 DIAGNOSIS — Z4800 Encounter for change or removal of nonsurgical wound dressing: Secondary | ICD-10-CM | POA: Diagnosis not present

## 2018-04-07 DIAGNOSIS — E1142 Type 2 diabetes mellitus with diabetic polyneuropathy: Secondary | ICD-10-CM | POA: Diagnosis not present

## 2018-04-07 DIAGNOSIS — L89891 Pressure ulcer of other site, stage 1: Secondary | ICD-10-CM | POA: Diagnosis not present

## 2018-04-07 DIAGNOSIS — Z5181 Encounter for therapeutic drug level monitoring: Secondary | ICD-10-CM | POA: Diagnosis not present

## 2018-04-07 DIAGNOSIS — M62838 Other muscle spasm: Secondary | ICD-10-CM | POA: Diagnosis not present

## 2018-04-07 DIAGNOSIS — Z87891 Personal history of nicotine dependence: Secondary | ICD-10-CM | POA: Diagnosis not present

## 2018-04-07 DIAGNOSIS — L89312 Pressure ulcer of right buttock, stage 2: Secondary | ICD-10-CM | POA: Diagnosis not present

## 2018-04-07 DIAGNOSIS — I5042 Chronic combined systolic (congestive) and diastolic (congestive) heart failure: Secondary | ICD-10-CM | POA: Diagnosis not present

## 2018-04-07 DIAGNOSIS — L89623 Pressure ulcer of left heel, stage 3: Secondary | ICD-10-CM | POA: Diagnosis not present

## 2018-04-07 DIAGNOSIS — E1165 Type 2 diabetes mellitus with hyperglycemia: Secondary | ICD-10-CM | POA: Diagnosis not present

## 2018-04-07 DIAGNOSIS — M1612 Unilateral primary osteoarthritis, left hip: Secondary | ICD-10-CM | POA: Diagnosis not present

## 2018-04-07 DIAGNOSIS — Z7901 Long term (current) use of anticoagulants: Secondary | ICD-10-CM | POA: Diagnosis not present

## 2018-04-07 DIAGNOSIS — L89611 Pressure ulcer of right heel, stage 1: Secondary | ICD-10-CM | POA: Diagnosis not present

## 2018-04-07 DIAGNOSIS — N183 Chronic kidney disease, stage 3 (moderate): Secondary | ICD-10-CM | POA: Diagnosis not present

## 2018-04-08 DIAGNOSIS — L89611 Pressure ulcer of right heel, stage 1: Secondary | ICD-10-CM | POA: Diagnosis not present

## 2018-04-08 DIAGNOSIS — E1142 Type 2 diabetes mellitus with diabetic polyneuropathy: Secondary | ICD-10-CM | POA: Diagnosis not present

## 2018-04-08 DIAGNOSIS — M62838 Other muscle spasm: Secondary | ICD-10-CM | POA: Diagnosis not present

## 2018-04-08 DIAGNOSIS — I130 Hypertensive heart and chronic kidney disease with heart failure and stage 1 through stage 4 chronic kidney disease, or unspecified chronic kidney disease: Secondary | ICD-10-CM | POA: Diagnosis not present

## 2018-04-08 DIAGNOSIS — L89891 Pressure ulcer of other site, stage 1: Secondary | ICD-10-CM | POA: Diagnosis not present

## 2018-04-08 DIAGNOSIS — L89623 Pressure ulcer of left heel, stage 3: Secondary | ICD-10-CM | POA: Diagnosis not present

## 2018-04-08 DIAGNOSIS — N183 Chronic kidney disease, stage 3 (moderate): Secondary | ICD-10-CM | POA: Diagnosis not present

## 2018-04-08 DIAGNOSIS — Z4800 Encounter for change or removal of nonsurgical wound dressing: Secondary | ICD-10-CM | POA: Diagnosis not present

## 2018-04-08 DIAGNOSIS — I4891 Unspecified atrial fibrillation: Secondary | ICD-10-CM | POA: Diagnosis not present

## 2018-04-08 DIAGNOSIS — E1122 Type 2 diabetes mellitus with diabetic chronic kidney disease: Secondary | ICD-10-CM | POA: Diagnosis not present

## 2018-04-08 DIAGNOSIS — M1612 Unilateral primary osteoarthritis, left hip: Secondary | ICD-10-CM | POA: Diagnosis not present

## 2018-04-08 DIAGNOSIS — E1165 Type 2 diabetes mellitus with hyperglycemia: Secondary | ICD-10-CM | POA: Diagnosis not present

## 2018-04-08 DIAGNOSIS — Z87891 Personal history of nicotine dependence: Secondary | ICD-10-CM | POA: Diagnosis not present

## 2018-04-08 DIAGNOSIS — L89312 Pressure ulcer of right buttock, stage 2: Secondary | ICD-10-CM | POA: Diagnosis not present

## 2018-04-08 DIAGNOSIS — Z5181 Encounter for therapeutic drug level monitoring: Secondary | ICD-10-CM | POA: Diagnosis not present

## 2018-04-08 DIAGNOSIS — R29898 Other symptoms and signs involving the musculoskeletal system: Secondary | ICD-10-CM | POA: Diagnosis not present

## 2018-04-08 DIAGNOSIS — Z7901 Long term (current) use of anticoagulants: Secondary | ICD-10-CM | POA: Diagnosis not present

## 2018-04-08 DIAGNOSIS — I5042 Chronic combined systolic (congestive) and diastolic (congestive) heart failure: Secondary | ICD-10-CM | POA: Diagnosis not present

## 2018-04-10 DIAGNOSIS — Z87891 Personal history of nicotine dependence: Secondary | ICD-10-CM | POA: Diagnosis not present

## 2018-04-10 DIAGNOSIS — I4891 Unspecified atrial fibrillation: Secondary | ICD-10-CM | POA: Diagnosis not present

## 2018-04-10 DIAGNOSIS — M1612 Unilateral primary osteoarthritis, left hip: Secondary | ICD-10-CM | POA: Diagnosis not present

## 2018-04-10 DIAGNOSIS — L89312 Pressure ulcer of right buttock, stage 2: Secondary | ICD-10-CM | POA: Diagnosis not present

## 2018-04-10 DIAGNOSIS — L89891 Pressure ulcer of other site, stage 1: Secondary | ICD-10-CM | POA: Diagnosis not present

## 2018-04-10 DIAGNOSIS — R29898 Other symptoms and signs involving the musculoskeletal system: Secondary | ICD-10-CM | POA: Diagnosis not present

## 2018-04-10 DIAGNOSIS — L89623 Pressure ulcer of left heel, stage 3: Secondary | ICD-10-CM | POA: Diagnosis not present

## 2018-04-10 DIAGNOSIS — N183 Chronic kidney disease, stage 3 (moderate): Secondary | ICD-10-CM | POA: Diagnosis not present

## 2018-04-10 DIAGNOSIS — L89611 Pressure ulcer of right heel, stage 1: Secondary | ICD-10-CM | POA: Diagnosis not present

## 2018-04-10 DIAGNOSIS — M62838 Other muscle spasm: Secondary | ICD-10-CM | POA: Diagnosis not present

## 2018-04-10 DIAGNOSIS — Z7901 Long term (current) use of anticoagulants: Secondary | ICD-10-CM | POA: Diagnosis not present

## 2018-04-10 DIAGNOSIS — Z4800 Encounter for change or removal of nonsurgical wound dressing: Secondary | ICD-10-CM | POA: Diagnosis not present

## 2018-04-10 DIAGNOSIS — E1122 Type 2 diabetes mellitus with diabetic chronic kidney disease: Secondary | ICD-10-CM | POA: Diagnosis not present

## 2018-04-10 DIAGNOSIS — I5042 Chronic combined systolic (congestive) and diastolic (congestive) heart failure: Secondary | ICD-10-CM | POA: Diagnosis not present

## 2018-04-10 DIAGNOSIS — E1165 Type 2 diabetes mellitus with hyperglycemia: Secondary | ICD-10-CM | POA: Diagnosis not present

## 2018-04-10 DIAGNOSIS — E1142 Type 2 diabetes mellitus with diabetic polyneuropathy: Secondary | ICD-10-CM | POA: Diagnosis not present

## 2018-04-10 DIAGNOSIS — Z5181 Encounter for therapeutic drug level monitoring: Secondary | ICD-10-CM | POA: Diagnosis not present

## 2018-04-10 DIAGNOSIS — I130 Hypertensive heart and chronic kidney disease with heart failure and stage 1 through stage 4 chronic kidney disease, or unspecified chronic kidney disease: Secondary | ICD-10-CM | POA: Diagnosis not present

## 2018-04-13 DIAGNOSIS — Z5181 Encounter for therapeutic drug level monitoring: Secondary | ICD-10-CM | POA: Diagnosis not present

## 2018-04-13 DIAGNOSIS — L89611 Pressure ulcer of right heel, stage 1: Secondary | ICD-10-CM | POA: Diagnosis not present

## 2018-04-13 DIAGNOSIS — L89623 Pressure ulcer of left heel, stage 3: Secondary | ICD-10-CM | POA: Diagnosis not present

## 2018-04-13 DIAGNOSIS — L89891 Pressure ulcer of other site, stage 1: Secondary | ICD-10-CM | POA: Diagnosis not present

## 2018-04-13 DIAGNOSIS — M62838 Other muscle spasm: Secondary | ICD-10-CM | POA: Diagnosis not present

## 2018-04-13 DIAGNOSIS — E1122 Type 2 diabetes mellitus with diabetic chronic kidney disease: Secondary | ICD-10-CM | POA: Diagnosis not present

## 2018-04-13 DIAGNOSIS — E1165 Type 2 diabetes mellitus with hyperglycemia: Secondary | ICD-10-CM | POA: Diagnosis not present

## 2018-04-13 DIAGNOSIS — Z7901 Long term (current) use of anticoagulants: Secondary | ICD-10-CM | POA: Diagnosis not present

## 2018-04-13 DIAGNOSIS — I5042 Chronic combined systolic (congestive) and diastolic (congestive) heart failure: Secondary | ICD-10-CM | POA: Diagnosis not present

## 2018-04-13 DIAGNOSIS — Z87891 Personal history of nicotine dependence: Secondary | ICD-10-CM | POA: Diagnosis not present

## 2018-04-13 DIAGNOSIS — R29898 Other symptoms and signs involving the musculoskeletal system: Secondary | ICD-10-CM | POA: Diagnosis not present

## 2018-04-13 DIAGNOSIS — L89312 Pressure ulcer of right buttock, stage 2: Secondary | ICD-10-CM | POA: Diagnosis not present

## 2018-04-13 DIAGNOSIS — I130 Hypertensive heart and chronic kidney disease with heart failure and stage 1 through stage 4 chronic kidney disease, or unspecified chronic kidney disease: Secondary | ICD-10-CM | POA: Diagnosis not present

## 2018-04-13 DIAGNOSIS — M1612 Unilateral primary osteoarthritis, left hip: Secondary | ICD-10-CM | POA: Diagnosis not present

## 2018-04-13 DIAGNOSIS — I4891 Unspecified atrial fibrillation: Secondary | ICD-10-CM | POA: Diagnosis not present

## 2018-04-13 DIAGNOSIS — N183 Chronic kidney disease, stage 3 (moderate): Secondary | ICD-10-CM | POA: Diagnosis not present

## 2018-04-13 DIAGNOSIS — Z4800 Encounter for change or removal of nonsurgical wound dressing: Secondary | ICD-10-CM | POA: Diagnosis not present

## 2018-04-13 DIAGNOSIS — E1142 Type 2 diabetes mellitus with diabetic polyneuropathy: Secondary | ICD-10-CM | POA: Diagnosis not present

## 2018-04-14 DIAGNOSIS — I130 Hypertensive heart and chronic kidney disease with heart failure and stage 1 through stage 4 chronic kidney disease, or unspecified chronic kidney disease: Secondary | ICD-10-CM | POA: Diagnosis not present

## 2018-04-14 DIAGNOSIS — I5042 Chronic combined systolic (congestive) and diastolic (congestive) heart failure: Secondary | ICD-10-CM | POA: Diagnosis not present

## 2018-04-14 DIAGNOSIS — E1165 Type 2 diabetes mellitus with hyperglycemia: Secondary | ICD-10-CM | POA: Diagnosis not present

## 2018-04-14 DIAGNOSIS — L89891 Pressure ulcer of other site, stage 1: Secondary | ICD-10-CM | POA: Diagnosis not present

## 2018-04-14 DIAGNOSIS — Z5181 Encounter for therapeutic drug level monitoring: Secondary | ICD-10-CM | POA: Diagnosis not present

## 2018-04-14 DIAGNOSIS — R29898 Other symptoms and signs involving the musculoskeletal system: Secondary | ICD-10-CM | POA: Diagnosis not present

## 2018-04-14 DIAGNOSIS — I4891 Unspecified atrial fibrillation: Secondary | ICD-10-CM | POA: Diagnosis not present

## 2018-04-14 DIAGNOSIS — Z87891 Personal history of nicotine dependence: Secondary | ICD-10-CM | POA: Diagnosis not present

## 2018-04-14 DIAGNOSIS — M62838 Other muscle spasm: Secondary | ICD-10-CM | POA: Diagnosis not present

## 2018-04-14 DIAGNOSIS — Z4800 Encounter for change or removal of nonsurgical wound dressing: Secondary | ICD-10-CM | POA: Diagnosis not present

## 2018-04-14 DIAGNOSIS — L89312 Pressure ulcer of right buttock, stage 2: Secondary | ICD-10-CM | POA: Diagnosis not present

## 2018-04-14 DIAGNOSIS — M1612 Unilateral primary osteoarthritis, left hip: Secondary | ICD-10-CM | POA: Diagnosis not present

## 2018-04-14 DIAGNOSIS — E1142 Type 2 diabetes mellitus with diabetic polyneuropathy: Secondary | ICD-10-CM | POA: Diagnosis not present

## 2018-04-14 DIAGNOSIS — Z7901 Long term (current) use of anticoagulants: Secondary | ICD-10-CM | POA: Diagnosis not present

## 2018-04-14 DIAGNOSIS — E1122 Type 2 diabetes mellitus with diabetic chronic kidney disease: Secondary | ICD-10-CM | POA: Diagnosis not present

## 2018-04-14 DIAGNOSIS — L89623 Pressure ulcer of left heel, stage 3: Secondary | ICD-10-CM | POA: Diagnosis not present

## 2018-04-14 DIAGNOSIS — L89611 Pressure ulcer of right heel, stage 1: Secondary | ICD-10-CM | POA: Diagnosis not present

## 2018-04-14 DIAGNOSIS — N183 Chronic kidney disease, stage 3 (moderate): Secondary | ICD-10-CM | POA: Diagnosis not present

## 2018-04-16 DIAGNOSIS — Z7901 Long term (current) use of anticoagulants: Secondary | ICD-10-CM | POA: Diagnosis not present

## 2018-04-16 DIAGNOSIS — Z87891 Personal history of nicotine dependence: Secondary | ICD-10-CM | POA: Diagnosis not present

## 2018-04-16 DIAGNOSIS — L89891 Pressure ulcer of other site, stage 1: Secondary | ICD-10-CM | POA: Diagnosis not present

## 2018-04-16 DIAGNOSIS — E1165 Type 2 diabetes mellitus with hyperglycemia: Secondary | ICD-10-CM | POA: Diagnosis not present

## 2018-04-16 DIAGNOSIS — I5042 Chronic combined systolic (congestive) and diastolic (congestive) heart failure: Secondary | ICD-10-CM | POA: Diagnosis not present

## 2018-04-16 DIAGNOSIS — I130 Hypertensive heart and chronic kidney disease with heart failure and stage 1 through stage 4 chronic kidney disease, or unspecified chronic kidney disease: Secondary | ICD-10-CM | POA: Diagnosis not present

## 2018-04-16 DIAGNOSIS — I4891 Unspecified atrial fibrillation: Secondary | ICD-10-CM | POA: Diagnosis not present

## 2018-04-16 DIAGNOSIS — L89623 Pressure ulcer of left heel, stage 3: Secondary | ICD-10-CM | POA: Diagnosis not present

## 2018-04-16 DIAGNOSIS — Z4800 Encounter for change or removal of nonsurgical wound dressing: Secondary | ICD-10-CM | POA: Diagnosis not present

## 2018-04-16 DIAGNOSIS — E1142 Type 2 diabetes mellitus with diabetic polyneuropathy: Secondary | ICD-10-CM | POA: Diagnosis not present

## 2018-04-16 DIAGNOSIS — Z5181 Encounter for therapeutic drug level monitoring: Secondary | ICD-10-CM | POA: Diagnosis not present

## 2018-04-16 DIAGNOSIS — N183 Chronic kidney disease, stage 3 (moderate): Secondary | ICD-10-CM | POA: Diagnosis not present

## 2018-04-16 DIAGNOSIS — M1612 Unilateral primary osteoarthritis, left hip: Secondary | ICD-10-CM | POA: Diagnosis not present

## 2018-04-16 DIAGNOSIS — L89611 Pressure ulcer of right heel, stage 1: Secondary | ICD-10-CM | POA: Diagnosis not present

## 2018-04-16 DIAGNOSIS — E1122 Type 2 diabetes mellitus with diabetic chronic kidney disease: Secondary | ICD-10-CM | POA: Diagnosis not present

## 2018-04-16 DIAGNOSIS — L89312 Pressure ulcer of right buttock, stage 2: Secondary | ICD-10-CM | POA: Diagnosis not present

## 2018-04-16 DIAGNOSIS — R29898 Other symptoms and signs involving the musculoskeletal system: Secondary | ICD-10-CM | POA: Diagnosis not present

## 2018-04-16 DIAGNOSIS — M62838 Other muscle spasm: Secondary | ICD-10-CM | POA: Diagnosis not present

## 2018-04-17 DIAGNOSIS — I130 Hypertensive heart and chronic kidney disease with heart failure and stage 1 through stage 4 chronic kidney disease, or unspecified chronic kidney disease: Secondary | ICD-10-CM | POA: Diagnosis not present

## 2018-04-17 DIAGNOSIS — E1122 Type 2 diabetes mellitus with diabetic chronic kidney disease: Secondary | ICD-10-CM | POA: Diagnosis not present

## 2018-04-17 DIAGNOSIS — I4891 Unspecified atrial fibrillation: Secondary | ICD-10-CM | POA: Diagnosis not present

## 2018-04-17 DIAGNOSIS — E1165 Type 2 diabetes mellitus with hyperglycemia: Secondary | ICD-10-CM | POA: Diagnosis not present

## 2018-04-17 DIAGNOSIS — L89891 Pressure ulcer of other site, stage 1: Secondary | ICD-10-CM | POA: Diagnosis not present

## 2018-04-17 DIAGNOSIS — Z4800 Encounter for change or removal of nonsurgical wound dressing: Secondary | ICD-10-CM | POA: Diagnosis not present

## 2018-04-17 DIAGNOSIS — M62838 Other muscle spasm: Secondary | ICD-10-CM | POA: Diagnosis not present

## 2018-04-17 DIAGNOSIS — L89312 Pressure ulcer of right buttock, stage 2: Secondary | ICD-10-CM | POA: Diagnosis not present

## 2018-04-17 DIAGNOSIS — M1612 Unilateral primary osteoarthritis, left hip: Secondary | ICD-10-CM | POA: Diagnosis not present

## 2018-04-17 DIAGNOSIS — L89611 Pressure ulcer of right heel, stage 1: Secondary | ICD-10-CM | POA: Diagnosis not present

## 2018-04-17 DIAGNOSIS — Z5181 Encounter for therapeutic drug level monitoring: Secondary | ICD-10-CM | POA: Diagnosis not present

## 2018-04-17 DIAGNOSIS — Z7901 Long term (current) use of anticoagulants: Secondary | ICD-10-CM | POA: Diagnosis not present

## 2018-04-17 DIAGNOSIS — I5042 Chronic combined systolic (congestive) and diastolic (congestive) heart failure: Secondary | ICD-10-CM | POA: Diagnosis not present

## 2018-04-17 DIAGNOSIS — N183 Chronic kidney disease, stage 3 (moderate): Secondary | ICD-10-CM | POA: Diagnosis not present

## 2018-04-17 DIAGNOSIS — L89623 Pressure ulcer of left heel, stage 3: Secondary | ICD-10-CM | POA: Diagnosis not present

## 2018-04-17 DIAGNOSIS — R29898 Other symptoms and signs involving the musculoskeletal system: Secondary | ICD-10-CM | POA: Diagnosis not present

## 2018-04-17 DIAGNOSIS — E1142 Type 2 diabetes mellitus with diabetic polyneuropathy: Secondary | ICD-10-CM | POA: Diagnosis not present

## 2018-04-17 DIAGNOSIS — Z87891 Personal history of nicotine dependence: Secondary | ICD-10-CM | POA: Diagnosis not present

## 2018-04-20 DIAGNOSIS — E1165 Type 2 diabetes mellitus with hyperglycemia: Secondary | ICD-10-CM | POA: Diagnosis not present

## 2018-04-20 DIAGNOSIS — M62838 Other muscle spasm: Secondary | ICD-10-CM | POA: Diagnosis not present

## 2018-04-20 DIAGNOSIS — Z7901 Long term (current) use of anticoagulants: Secondary | ICD-10-CM | POA: Diagnosis not present

## 2018-04-20 DIAGNOSIS — Z5181 Encounter for therapeutic drug level monitoring: Secondary | ICD-10-CM | POA: Diagnosis not present

## 2018-04-20 DIAGNOSIS — M1612 Unilateral primary osteoarthritis, left hip: Secondary | ICD-10-CM | POA: Diagnosis not present

## 2018-04-20 DIAGNOSIS — Z87891 Personal history of nicotine dependence: Secondary | ICD-10-CM | POA: Diagnosis not present

## 2018-04-20 DIAGNOSIS — I4891 Unspecified atrial fibrillation: Secondary | ICD-10-CM | POA: Diagnosis not present

## 2018-04-20 DIAGNOSIS — I130 Hypertensive heart and chronic kidney disease with heart failure and stage 1 through stage 4 chronic kidney disease, or unspecified chronic kidney disease: Secondary | ICD-10-CM | POA: Diagnosis not present

## 2018-04-20 DIAGNOSIS — L89891 Pressure ulcer of other site, stage 1: Secondary | ICD-10-CM | POA: Diagnosis not present

## 2018-04-20 DIAGNOSIS — L89312 Pressure ulcer of right buttock, stage 2: Secondary | ICD-10-CM | POA: Diagnosis not present

## 2018-04-20 DIAGNOSIS — L89611 Pressure ulcer of right heel, stage 1: Secondary | ICD-10-CM | POA: Diagnosis not present

## 2018-04-20 DIAGNOSIS — N183 Chronic kidney disease, stage 3 (moderate): Secondary | ICD-10-CM | POA: Diagnosis not present

## 2018-04-20 DIAGNOSIS — I5042 Chronic combined systolic (congestive) and diastolic (congestive) heart failure: Secondary | ICD-10-CM | POA: Diagnosis not present

## 2018-04-20 DIAGNOSIS — L89623 Pressure ulcer of left heel, stage 3: Secondary | ICD-10-CM | POA: Diagnosis not present

## 2018-04-20 DIAGNOSIS — Z4800 Encounter for change or removal of nonsurgical wound dressing: Secondary | ICD-10-CM | POA: Diagnosis not present

## 2018-04-20 DIAGNOSIS — R29898 Other symptoms and signs involving the musculoskeletal system: Secondary | ICD-10-CM | POA: Diagnosis not present

## 2018-04-20 DIAGNOSIS — E1142 Type 2 diabetes mellitus with diabetic polyneuropathy: Secondary | ICD-10-CM | POA: Diagnosis not present

## 2018-04-20 DIAGNOSIS — E1122 Type 2 diabetes mellitus with diabetic chronic kidney disease: Secondary | ICD-10-CM | POA: Diagnosis not present

## 2018-04-21 DIAGNOSIS — Z4800 Encounter for change or removal of nonsurgical wound dressing: Secondary | ICD-10-CM | POA: Diagnosis not present

## 2018-04-21 DIAGNOSIS — L89891 Pressure ulcer of other site, stage 1: Secondary | ICD-10-CM | POA: Diagnosis not present

## 2018-04-21 DIAGNOSIS — L89623 Pressure ulcer of left heel, stage 3: Secondary | ICD-10-CM | POA: Diagnosis not present

## 2018-04-21 DIAGNOSIS — E1142 Type 2 diabetes mellitus with diabetic polyneuropathy: Secondary | ICD-10-CM | POA: Diagnosis not present

## 2018-04-21 DIAGNOSIS — I5042 Chronic combined systolic (congestive) and diastolic (congestive) heart failure: Secondary | ICD-10-CM | POA: Diagnosis not present

## 2018-04-21 DIAGNOSIS — I130 Hypertensive heart and chronic kidney disease with heart failure and stage 1 through stage 4 chronic kidney disease, or unspecified chronic kidney disease: Secondary | ICD-10-CM | POA: Diagnosis not present

## 2018-04-21 DIAGNOSIS — Z87891 Personal history of nicotine dependence: Secondary | ICD-10-CM | POA: Diagnosis not present

## 2018-04-21 DIAGNOSIS — L89611 Pressure ulcer of right heel, stage 1: Secondary | ICD-10-CM | POA: Diagnosis not present

## 2018-04-21 DIAGNOSIS — E1165 Type 2 diabetes mellitus with hyperglycemia: Secondary | ICD-10-CM | POA: Diagnosis not present

## 2018-04-21 DIAGNOSIS — E1122 Type 2 diabetes mellitus with diabetic chronic kidney disease: Secondary | ICD-10-CM | POA: Diagnosis not present

## 2018-04-21 DIAGNOSIS — N183 Chronic kidney disease, stage 3 (moderate): Secondary | ICD-10-CM | POA: Diagnosis not present

## 2018-04-21 DIAGNOSIS — R29898 Other symptoms and signs involving the musculoskeletal system: Secondary | ICD-10-CM | POA: Diagnosis not present

## 2018-04-21 DIAGNOSIS — L89312 Pressure ulcer of right buttock, stage 2: Secondary | ICD-10-CM | POA: Diagnosis not present

## 2018-04-21 DIAGNOSIS — I4891 Unspecified atrial fibrillation: Secondary | ICD-10-CM | POA: Diagnosis not present

## 2018-04-21 DIAGNOSIS — M1612 Unilateral primary osteoarthritis, left hip: Secondary | ICD-10-CM | POA: Diagnosis not present

## 2018-04-21 DIAGNOSIS — Z5181 Encounter for therapeutic drug level monitoring: Secondary | ICD-10-CM | POA: Diagnosis not present

## 2018-04-21 DIAGNOSIS — Z7901 Long term (current) use of anticoagulants: Secondary | ICD-10-CM | POA: Diagnosis not present

## 2018-04-21 DIAGNOSIS — M62838 Other muscle spasm: Secondary | ICD-10-CM | POA: Diagnosis not present

## 2018-04-22 DIAGNOSIS — I4891 Unspecified atrial fibrillation: Secondary | ICD-10-CM | POA: Diagnosis not present

## 2018-04-22 DIAGNOSIS — E1122 Type 2 diabetes mellitus with diabetic chronic kidney disease: Secondary | ICD-10-CM | POA: Diagnosis not present

## 2018-04-22 DIAGNOSIS — Z4800 Encounter for change or removal of nonsurgical wound dressing: Secondary | ICD-10-CM | POA: Diagnosis not present

## 2018-04-22 DIAGNOSIS — E1165 Type 2 diabetes mellitus with hyperglycemia: Secondary | ICD-10-CM | POA: Diagnosis not present

## 2018-04-22 DIAGNOSIS — L89611 Pressure ulcer of right heel, stage 1: Secondary | ICD-10-CM | POA: Diagnosis not present

## 2018-04-22 DIAGNOSIS — L89891 Pressure ulcer of other site, stage 1: Secondary | ICD-10-CM | POA: Diagnosis not present

## 2018-04-22 DIAGNOSIS — I5042 Chronic combined systolic (congestive) and diastolic (congestive) heart failure: Secondary | ICD-10-CM | POA: Diagnosis not present

## 2018-04-22 DIAGNOSIS — E1142 Type 2 diabetes mellitus with diabetic polyneuropathy: Secondary | ICD-10-CM | POA: Diagnosis not present

## 2018-04-22 DIAGNOSIS — R29898 Other symptoms and signs involving the musculoskeletal system: Secondary | ICD-10-CM | POA: Diagnosis not present

## 2018-04-22 DIAGNOSIS — L89623 Pressure ulcer of left heel, stage 3: Secondary | ICD-10-CM | POA: Diagnosis not present

## 2018-04-22 DIAGNOSIS — Z5181 Encounter for therapeutic drug level monitoring: Secondary | ICD-10-CM | POA: Diagnosis not present

## 2018-04-22 DIAGNOSIS — Z87891 Personal history of nicotine dependence: Secondary | ICD-10-CM | POA: Diagnosis not present

## 2018-04-22 DIAGNOSIS — I130 Hypertensive heart and chronic kidney disease with heart failure and stage 1 through stage 4 chronic kidney disease, or unspecified chronic kidney disease: Secondary | ICD-10-CM | POA: Diagnosis not present

## 2018-04-22 DIAGNOSIS — M1612 Unilateral primary osteoarthritis, left hip: Secondary | ICD-10-CM | POA: Diagnosis not present

## 2018-04-22 DIAGNOSIS — M62838 Other muscle spasm: Secondary | ICD-10-CM | POA: Diagnosis not present

## 2018-04-22 DIAGNOSIS — Z7901 Long term (current) use of anticoagulants: Secondary | ICD-10-CM | POA: Diagnosis not present

## 2018-04-22 DIAGNOSIS — N183 Chronic kidney disease, stage 3 (moderate): Secondary | ICD-10-CM | POA: Diagnosis not present

## 2018-04-22 DIAGNOSIS — L89312 Pressure ulcer of right buttock, stage 2: Secondary | ICD-10-CM | POA: Diagnosis not present

## 2018-04-23 DIAGNOSIS — L89623 Pressure ulcer of left heel, stage 3: Secondary | ICD-10-CM | POA: Diagnosis not present

## 2018-04-23 DIAGNOSIS — M62838 Other muscle spasm: Secondary | ICD-10-CM | POA: Diagnosis not present

## 2018-04-23 DIAGNOSIS — Z5181 Encounter for therapeutic drug level monitoring: Secondary | ICD-10-CM | POA: Diagnosis not present

## 2018-04-23 DIAGNOSIS — Z4800 Encounter for change or removal of nonsurgical wound dressing: Secondary | ICD-10-CM | POA: Diagnosis not present

## 2018-04-23 DIAGNOSIS — I5042 Chronic combined systolic (congestive) and diastolic (congestive) heart failure: Secondary | ICD-10-CM | POA: Diagnosis not present

## 2018-04-23 DIAGNOSIS — I4891 Unspecified atrial fibrillation: Secondary | ICD-10-CM | POA: Diagnosis not present

## 2018-04-23 DIAGNOSIS — I130 Hypertensive heart and chronic kidney disease with heart failure and stage 1 through stage 4 chronic kidney disease, or unspecified chronic kidney disease: Secondary | ICD-10-CM | POA: Diagnosis not present

## 2018-04-23 DIAGNOSIS — N183 Chronic kidney disease, stage 3 (moderate): Secondary | ICD-10-CM | POA: Diagnosis not present

## 2018-04-23 DIAGNOSIS — E1165 Type 2 diabetes mellitus with hyperglycemia: Secondary | ICD-10-CM | POA: Diagnosis not present

## 2018-04-23 DIAGNOSIS — L89891 Pressure ulcer of other site, stage 1: Secondary | ICD-10-CM | POA: Diagnosis not present

## 2018-04-23 DIAGNOSIS — Z7901 Long term (current) use of anticoagulants: Secondary | ICD-10-CM | POA: Diagnosis not present

## 2018-04-23 DIAGNOSIS — E1122 Type 2 diabetes mellitus with diabetic chronic kidney disease: Secondary | ICD-10-CM | POA: Diagnosis not present

## 2018-04-23 DIAGNOSIS — Z87891 Personal history of nicotine dependence: Secondary | ICD-10-CM | POA: Diagnosis not present

## 2018-04-23 DIAGNOSIS — M1612 Unilateral primary osteoarthritis, left hip: Secondary | ICD-10-CM | POA: Diagnosis not present

## 2018-04-23 DIAGNOSIS — E1142 Type 2 diabetes mellitus with diabetic polyneuropathy: Secondary | ICD-10-CM | POA: Diagnosis not present

## 2018-04-23 DIAGNOSIS — R29898 Other symptoms and signs involving the musculoskeletal system: Secondary | ICD-10-CM | POA: Diagnosis not present

## 2018-04-23 DIAGNOSIS — L89611 Pressure ulcer of right heel, stage 1: Secondary | ICD-10-CM | POA: Diagnosis not present

## 2018-04-23 DIAGNOSIS — L89312 Pressure ulcer of right buttock, stage 2: Secondary | ICD-10-CM | POA: Diagnosis not present

## 2018-04-24 DIAGNOSIS — Z7901 Long term (current) use of anticoagulants: Secondary | ICD-10-CM | POA: Diagnosis not present

## 2018-04-24 DIAGNOSIS — E1142 Type 2 diabetes mellitus with diabetic polyneuropathy: Secondary | ICD-10-CM | POA: Diagnosis not present

## 2018-04-24 DIAGNOSIS — L89891 Pressure ulcer of other site, stage 1: Secondary | ICD-10-CM | POA: Diagnosis not present

## 2018-04-24 DIAGNOSIS — Z87891 Personal history of nicotine dependence: Secondary | ICD-10-CM | POA: Diagnosis not present

## 2018-04-24 DIAGNOSIS — E1122 Type 2 diabetes mellitus with diabetic chronic kidney disease: Secondary | ICD-10-CM | POA: Diagnosis not present

## 2018-04-24 DIAGNOSIS — L89312 Pressure ulcer of right buttock, stage 2: Secondary | ICD-10-CM | POA: Diagnosis not present

## 2018-04-24 DIAGNOSIS — L89611 Pressure ulcer of right heel, stage 1: Secondary | ICD-10-CM | POA: Diagnosis not present

## 2018-04-24 DIAGNOSIS — N183 Chronic kidney disease, stage 3 (moderate): Secondary | ICD-10-CM | POA: Diagnosis not present

## 2018-04-24 DIAGNOSIS — Z5181 Encounter for therapeutic drug level monitoring: Secondary | ICD-10-CM | POA: Diagnosis not present

## 2018-04-24 DIAGNOSIS — Z4800 Encounter for change or removal of nonsurgical wound dressing: Secondary | ICD-10-CM | POA: Diagnosis not present

## 2018-04-24 DIAGNOSIS — M1612 Unilateral primary osteoarthritis, left hip: Secondary | ICD-10-CM | POA: Diagnosis not present

## 2018-04-24 DIAGNOSIS — I4891 Unspecified atrial fibrillation: Secondary | ICD-10-CM | POA: Diagnosis not present

## 2018-04-24 DIAGNOSIS — E1165 Type 2 diabetes mellitus with hyperglycemia: Secondary | ICD-10-CM | POA: Diagnosis not present

## 2018-04-24 DIAGNOSIS — M62838 Other muscle spasm: Secondary | ICD-10-CM | POA: Diagnosis not present

## 2018-04-24 DIAGNOSIS — L89623 Pressure ulcer of left heel, stage 3: Secondary | ICD-10-CM | POA: Diagnosis not present

## 2018-04-24 DIAGNOSIS — I130 Hypertensive heart and chronic kidney disease with heart failure and stage 1 through stage 4 chronic kidney disease, or unspecified chronic kidney disease: Secondary | ICD-10-CM | POA: Diagnosis not present

## 2018-04-24 DIAGNOSIS — I5042 Chronic combined systolic (congestive) and diastolic (congestive) heart failure: Secondary | ICD-10-CM | POA: Diagnosis not present

## 2018-04-24 DIAGNOSIS — R29898 Other symptoms and signs involving the musculoskeletal system: Secondary | ICD-10-CM | POA: Diagnosis not present

## 2018-04-27 DIAGNOSIS — I5042 Chronic combined systolic (congestive) and diastolic (congestive) heart failure: Secondary | ICD-10-CM | POA: Diagnosis not present

## 2018-04-27 DIAGNOSIS — I4891 Unspecified atrial fibrillation: Secondary | ICD-10-CM | POA: Diagnosis not present

## 2018-04-27 DIAGNOSIS — Z7901 Long term (current) use of anticoagulants: Secondary | ICD-10-CM | POA: Diagnosis not present

## 2018-04-27 DIAGNOSIS — L89891 Pressure ulcer of other site, stage 1: Secondary | ICD-10-CM | POA: Diagnosis not present

## 2018-04-27 DIAGNOSIS — L89312 Pressure ulcer of right buttock, stage 2: Secondary | ICD-10-CM | POA: Diagnosis not present

## 2018-04-27 DIAGNOSIS — Z87891 Personal history of nicotine dependence: Secondary | ICD-10-CM | POA: Diagnosis not present

## 2018-04-27 DIAGNOSIS — L89611 Pressure ulcer of right heel, stage 1: Secondary | ICD-10-CM | POA: Diagnosis not present

## 2018-04-27 DIAGNOSIS — M1612 Unilateral primary osteoarthritis, left hip: Secondary | ICD-10-CM | POA: Diagnosis not present

## 2018-04-27 DIAGNOSIS — L89623 Pressure ulcer of left heel, stage 3: Secondary | ICD-10-CM | POA: Diagnosis not present

## 2018-04-27 DIAGNOSIS — E1165 Type 2 diabetes mellitus with hyperglycemia: Secondary | ICD-10-CM | POA: Diagnosis not present

## 2018-04-27 DIAGNOSIS — Z5181 Encounter for therapeutic drug level monitoring: Secondary | ICD-10-CM | POA: Diagnosis not present

## 2018-04-27 DIAGNOSIS — I130 Hypertensive heart and chronic kidney disease with heart failure and stage 1 through stage 4 chronic kidney disease, or unspecified chronic kidney disease: Secondary | ICD-10-CM | POA: Diagnosis not present

## 2018-04-27 DIAGNOSIS — R29898 Other symptoms and signs involving the musculoskeletal system: Secondary | ICD-10-CM | POA: Diagnosis not present

## 2018-04-27 DIAGNOSIS — N183 Chronic kidney disease, stage 3 (moderate): Secondary | ICD-10-CM | POA: Diagnosis not present

## 2018-04-27 DIAGNOSIS — Z4800 Encounter for change or removal of nonsurgical wound dressing: Secondary | ICD-10-CM | POA: Diagnosis not present

## 2018-04-27 DIAGNOSIS — E1122 Type 2 diabetes mellitus with diabetic chronic kidney disease: Secondary | ICD-10-CM | POA: Diagnosis not present

## 2018-04-27 DIAGNOSIS — E1142 Type 2 diabetes mellitus with diabetic polyneuropathy: Secondary | ICD-10-CM | POA: Diagnosis not present

## 2018-04-27 DIAGNOSIS — M62838 Other muscle spasm: Secondary | ICD-10-CM | POA: Diagnosis not present

## 2018-04-28 DIAGNOSIS — E1165 Type 2 diabetes mellitus with hyperglycemia: Secondary | ICD-10-CM | POA: Diagnosis not present

## 2018-04-28 DIAGNOSIS — Z87891 Personal history of nicotine dependence: Secondary | ICD-10-CM | POA: Diagnosis not present

## 2018-04-28 DIAGNOSIS — I130 Hypertensive heart and chronic kidney disease with heart failure and stage 1 through stage 4 chronic kidney disease, or unspecified chronic kidney disease: Secondary | ICD-10-CM | POA: Diagnosis not present

## 2018-04-28 DIAGNOSIS — E1122 Type 2 diabetes mellitus with diabetic chronic kidney disease: Secondary | ICD-10-CM | POA: Diagnosis not present

## 2018-04-28 DIAGNOSIS — L89312 Pressure ulcer of right buttock, stage 2: Secondary | ICD-10-CM | POA: Diagnosis not present

## 2018-04-28 DIAGNOSIS — M62838 Other muscle spasm: Secondary | ICD-10-CM | POA: Diagnosis not present

## 2018-04-28 DIAGNOSIS — L89891 Pressure ulcer of other site, stage 1: Secondary | ICD-10-CM | POA: Diagnosis not present

## 2018-04-28 DIAGNOSIS — Z4800 Encounter for change or removal of nonsurgical wound dressing: Secondary | ICD-10-CM | POA: Diagnosis not present

## 2018-04-28 DIAGNOSIS — Z7901 Long term (current) use of anticoagulants: Secondary | ICD-10-CM | POA: Diagnosis not present

## 2018-04-28 DIAGNOSIS — I4891 Unspecified atrial fibrillation: Secondary | ICD-10-CM | POA: Diagnosis not present

## 2018-04-28 DIAGNOSIS — L89623 Pressure ulcer of left heel, stage 3: Secondary | ICD-10-CM | POA: Diagnosis not present

## 2018-04-28 DIAGNOSIS — E1142 Type 2 diabetes mellitus with diabetic polyneuropathy: Secondary | ICD-10-CM | POA: Diagnosis not present

## 2018-04-28 DIAGNOSIS — I5042 Chronic combined systolic (congestive) and diastolic (congestive) heart failure: Secondary | ICD-10-CM | POA: Diagnosis not present

## 2018-04-28 DIAGNOSIS — L89611 Pressure ulcer of right heel, stage 1: Secondary | ICD-10-CM | POA: Diagnosis not present

## 2018-04-28 DIAGNOSIS — R29898 Other symptoms and signs involving the musculoskeletal system: Secondary | ICD-10-CM | POA: Diagnosis not present

## 2018-04-28 DIAGNOSIS — N183 Chronic kidney disease, stage 3 (moderate): Secondary | ICD-10-CM | POA: Diagnosis not present

## 2018-04-28 DIAGNOSIS — M1612 Unilateral primary osteoarthritis, left hip: Secondary | ICD-10-CM | POA: Diagnosis not present

## 2018-04-28 DIAGNOSIS — Z5181 Encounter for therapeutic drug level monitoring: Secondary | ICD-10-CM | POA: Diagnosis not present

## 2018-04-30 DIAGNOSIS — E1142 Type 2 diabetes mellitus with diabetic polyneuropathy: Secondary | ICD-10-CM | POA: Diagnosis not present

## 2018-04-30 DIAGNOSIS — N183 Chronic kidney disease, stage 3 (moderate): Secondary | ICD-10-CM | POA: Diagnosis not present

## 2018-04-30 DIAGNOSIS — Z5181 Encounter for therapeutic drug level monitoring: Secondary | ICD-10-CM | POA: Diagnosis not present

## 2018-04-30 DIAGNOSIS — L89891 Pressure ulcer of other site, stage 1: Secondary | ICD-10-CM | POA: Diagnosis not present

## 2018-04-30 DIAGNOSIS — E1165 Type 2 diabetes mellitus with hyperglycemia: Secondary | ICD-10-CM | POA: Diagnosis not present

## 2018-04-30 DIAGNOSIS — R29898 Other symptoms and signs involving the musculoskeletal system: Secondary | ICD-10-CM | POA: Diagnosis not present

## 2018-04-30 DIAGNOSIS — M62838 Other muscle spasm: Secondary | ICD-10-CM | POA: Diagnosis not present

## 2018-04-30 DIAGNOSIS — E1122 Type 2 diabetes mellitus with diabetic chronic kidney disease: Secondary | ICD-10-CM | POA: Diagnosis not present

## 2018-04-30 DIAGNOSIS — M1612 Unilateral primary osteoarthritis, left hip: Secondary | ICD-10-CM | POA: Diagnosis not present

## 2018-04-30 DIAGNOSIS — I5042 Chronic combined systolic (congestive) and diastolic (congestive) heart failure: Secondary | ICD-10-CM | POA: Diagnosis not present

## 2018-04-30 DIAGNOSIS — I4891 Unspecified atrial fibrillation: Secondary | ICD-10-CM | POA: Diagnosis not present

## 2018-04-30 DIAGNOSIS — Z87891 Personal history of nicotine dependence: Secondary | ICD-10-CM | POA: Diagnosis not present

## 2018-04-30 DIAGNOSIS — L89611 Pressure ulcer of right heel, stage 1: Secondary | ICD-10-CM | POA: Diagnosis not present

## 2018-04-30 DIAGNOSIS — L89312 Pressure ulcer of right buttock, stage 2: Secondary | ICD-10-CM | POA: Diagnosis not present

## 2018-04-30 DIAGNOSIS — Z4800 Encounter for change or removal of nonsurgical wound dressing: Secondary | ICD-10-CM | POA: Diagnosis not present

## 2018-04-30 DIAGNOSIS — I130 Hypertensive heart and chronic kidney disease with heart failure and stage 1 through stage 4 chronic kidney disease, or unspecified chronic kidney disease: Secondary | ICD-10-CM | POA: Diagnosis not present

## 2018-04-30 DIAGNOSIS — Z7901 Long term (current) use of anticoagulants: Secondary | ICD-10-CM | POA: Diagnosis not present

## 2018-04-30 DIAGNOSIS — L89623 Pressure ulcer of left heel, stage 3: Secondary | ICD-10-CM | POA: Diagnosis not present

## 2018-05-01 DIAGNOSIS — L89312 Pressure ulcer of right buttock, stage 2: Secondary | ICD-10-CM | POA: Diagnosis not present

## 2018-05-01 DIAGNOSIS — Z5181 Encounter for therapeutic drug level monitoring: Secondary | ICD-10-CM | POA: Diagnosis not present

## 2018-05-01 DIAGNOSIS — M62838 Other muscle spasm: Secondary | ICD-10-CM | POA: Diagnosis not present

## 2018-05-01 DIAGNOSIS — M1612 Unilateral primary osteoarthritis, left hip: Secondary | ICD-10-CM | POA: Diagnosis not present

## 2018-05-01 DIAGNOSIS — Z7901 Long term (current) use of anticoagulants: Secondary | ICD-10-CM | POA: Diagnosis not present

## 2018-05-01 DIAGNOSIS — L89891 Pressure ulcer of other site, stage 1: Secondary | ICD-10-CM | POA: Diagnosis not present

## 2018-05-01 DIAGNOSIS — R29898 Other symptoms and signs involving the musculoskeletal system: Secondary | ICD-10-CM | POA: Diagnosis not present

## 2018-05-01 DIAGNOSIS — E1165 Type 2 diabetes mellitus with hyperglycemia: Secondary | ICD-10-CM | POA: Diagnosis not present

## 2018-05-01 DIAGNOSIS — I4891 Unspecified atrial fibrillation: Secondary | ICD-10-CM | POA: Diagnosis not present

## 2018-05-01 DIAGNOSIS — Z4800 Encounter for change or removal of nonsurgical wound dressing: Secondary | ICD-10-CM | POA: Diagnosis not present

## 2018-05-01 DIAGNOSIS — E1122 Type 2 diabetes mellitus with diabetic chronic kidney disease: Secondary | ICD-10-CM | POA: Diagnosis not present

## 2018-05-01 DIAGNOSIS — N183 Chronic kidney disease, stage 3 (moderate): Secondary | ICD-10-CM | POA: Diagnosis not present

## 2018-05-01 DIAGNOSIS — I130 Hypertensive heart and chronic kidney disease with heart failure and stage 1 through stage 4 chronic kidney disease, or unspecified chronic kidney disease: Secondary | ICD-10-CM | POA: Diagnosis not present

## 2018-05-01 DIAGNOSIS — I5042 Chronic combined systolic (congestive) and diastolic (congestive) heart failure: Secondary | ICD-10-CM | POA: Diagnosis not present

## 2018-05-01 DIAGNOSIS — L89611 Pressure ulcer of right heel, stage 1: Secondary | ICD-10-CM | POA: Diagnosis not present

## 2018-05-01 DIAGNOSIS — Z87891 Personal history of nicotine dependence: Secondary | ICD-10-CM | POA: Diagnosis not present

## 2018-05-01 DIAGNOSIS — E1142 Type 2 diabetes mellitus with diabetic polyneuropathy: Secondary | ICD-10-CM | POA: Diagnosis not present

## 2018-05-01 DIAGNOSIS — L89623 Pressure ulcer of left heel, stage 3: Secondary | ICD-10-CM | POA: Diagnosis not present

## 2018-05-04 DIAGNOSIS — M62838 Other muscle spasm: Secondary | ICD-10-CM | POA: Diagnosis not present

## 2018-05-04 DIAGNOSIS — I130 Hypertensive heart and chronic kidney disease with heart failure and stage 1 through stage 4 chronic kidney disease, or unspecified chronic kidney disease: Secondary | ICD-10-CM | POA: Diagnosis not present

## 2018-05-04 DIAGNOSIS — Z7901 Long term (current) use of anticoagulants: Secondary | ICD-10-CM | POA: Diagnosis not present

## 2018-05-04 DIAGNOSIS — Z4800 Encounter for change or removal of nonsurgical wound dressing: Secondary | ICD-10-CM | POA: Diagnosis not present

## 2018-05-04 DIAGNOSIS — E1165 Type 2 diabetes mellitus with hyperglycemia: Secondary | ICD-10-CM | POA: Diagnosis not present

## 2018-05-04 DIAGNOSIS — I5042 Chronic combined systolic (congestive) and diastolic (congestive) heart failure: Secondary | ICD-10-CM | POA: Diagnosis not present

## 2018-05-04 DIAGNOSIS — L89891 Pressure ulcer of other site, stage 1: Secondary | ICD-10-CM | POA: Diagnosis not present

## 2018-05-04 DIAGNOSIS — I4891 Unspecified atrial fibrillation: Secondary | ICD-10-CM | POA: Diagnosis not present

## 2018-05-04 DIAGNOSIS — M1612 Unilateral primary osteoarthritis, left hip: Secondary | ICD-10-CM | POA: Diagnosis not present

## 2018-05-04 DIAGNOSIS — E1122 Type 2 diabetes mellitus with diabetic chronic kidney disease: Secondary | ICD-10-CM | POA: Diagnosis not present

## 2018-05-04 DIAGNOSIS — L89611 Pressure ulcer of right heel, stage 1: Secondary | ICD-10-CM | POA: Diagnosis not present

## 2018-05-04 DIAGNOSIS — L89623 Pressure ulcer of left heel, stage 3: Secondary | ICD-10-CM | POA: Diagnosis not present

## 2018-05-04 DIAGNOSIS — Z87891 Personal history of nicotine dependence: Secondary | ICD-10-CM | POA: Diagnosis not present

## 2018-05-04 DIAGNOSIS — R29898 Other symptoms and signs involving the musculoskeletal system: Secondary | ICD-10-CM | POA: Diagnosis not present

## 2018-05-04 DIAGNOSIS — L89312 Pressure ulcer of right buttock, stage 2: Secondary | ICD-10-CM | POA: Diagnosis not present

## 2018-05-04 DIAGNOSIS — N183 Chronic kidney disease, stage 3 (moderate): Secondary | ICD-10-CM | POA: Diagnosis not present

## 2018-05-04 DIAGNOSIS — Z5181 Encounter for therapeutic drug level monitoring: Secondary | ICD-10-CM | POA: Diagnosis not present

## 2018-05-04 DIAGNOSIS — E1142 Type 2 diabetes mellitus with diabetic polyneuropathy: Secondary | ICD-10-CM | POA: Diagnosis not present

## 2018-05-05 DIAGNOSIS — M1612 Unilateral primary osteoarthritis, left hip: Secondary | ICD-10-CM | POA: Diagnosis not present

## 2018-05-05 DIAGNOSIS — L89623 Pressure ulcer of left heel, stage 3: Secondary | ICD-10-CM | POA: Diagnosis not present

## 2018-05-05 DIAGNOSIS — E1142 Type 2 diabetes mellitus with diabetic polyneuropathy: Secondary | ICD-10-CM | POA: Diagnosis not present

## 2018-05-05 DIAGNOSIS — Z4800 Encounter for change or removal of nonsurgical wound dressing: Secondary | ICD-10-CM | POA: Diagnosis not present

## 2018-05-05 DIAGNOSIS — Z5181 Encounter for therapeutic drug level monitoring: Secondary | ICD-10-CM | POA: Diagnosis not present

## 2018-05-05 DIAGNOSIS — E1122 Type 2 diabetes mellitus with diabetic chronic kidney disease: Secondary | ICD-10-CM | POA: Diagnosis not present

## 2018-05-05 DIAGNOSIS — E1165 Type 2 diabetes mellitus with hyperglycemia: Secondary | ICD-10-CM | POA: Diagnosis not present

## 2018-05-05 DIAGNOSIS — Z7901 Long term (current) use of anticoagulants: Secondary | ICD-10-CM | POA: Diagnosis not present

## 2018-05-05 DIAGNOSIS — I4891 Unspecified atrial fibrillation: Secondary | ICD-10-CM | POA: Diagnosis not present

## 2018-05-05 DIAGNOSIS — N183 Chronic kidney disease, stage 3 (moderate): Secondary | ICD-10-CM | POA: Diagnosis not present

## 2018-05-05 DIAGNOSIS — R29898 Other symptoms and signs involving the musculoskeletal system: Secondary | ICD-10-CM | POA: Diagnosis not present

## 2018-05-05 DIAGNOSIS — Z87891 Personal history of nicotine dependence: Secondary | ICD-10-CM | POA: Diagnosis not present

## 2018-05-05 DIAGNOSIS — L89891 Pressure ulcer of other site, stage 1: Secondary | ICD-10-CM | POA: Diagnosis not present

## 2018-05-05 DIAGNOSIS — L89312 Pressure ulcer of right buttock, stage 2: Secondary | ICD-10-CM | POA: Diagnosis not present

## 2018-05-05 DIAGNOSIS — I5042 Chronic combined systolic (congestive) and diastolic (congestive) heart failure: Secondary | ICD-10-CM | POA: Diagnosis not present

## 2018-05-05 DIAGNOSIS — L89611 Pressure ulcer of right heel, stage 1: Secondary | ICD-10-CM | POA: Diagnosis not present

## 2018-05-05 DIAGNOSIS — I130 Hypertensive heart and chronic kidney disease with heart failure and stage 1 through stage 4 chronic kidney disease, or unspecified chronic kidney disease: Secondary | ICD-10-CM | POA: Diagnosis not present

## 2018-05-05 DIAGNOSIS — M62838 Other muscle spasm: Secondary | ICD-10-CM | POA: Diagnosis not present

## 2018-05-07 DIAGNOSIS — L89623 Pressure ulcer of left heel, stage 3: Secondary | ICD-10-CM | POA: Diagnosis not present

## 2018-05-07 DIAGNOSIS — M1612 Unilateral primary osteoarthritis, left hip: Secondary | ICD-10-CM | POA: Diagnosis not present

## 2018-05-07 DIAGNOSIS — E1122 Type 2 diabetes mellitus with diabetic chronic kidney disease: Secondary | ICD-10-CM | POA: Diagnosis not present

## 2018-05-07 DIAGNOSIS — I4891 Unspecified atrial fibrillation: Secondary | ICD-10-CM | POA: Diagnosis not present

## 2018-05-07 DIAGNOSIS — M62838 Other muscle spasm: Secondary | ICD-10-CM | POA: Diagnosis not present

## 2018-05-07 DIAGNOSIS — Z7901 Long term (current) use of anticoagulants: Secondary | ICD-10-CM | POA: Diagnosis not present

## 2018-05-07 DIAGNOSIS — E1165 Type 2 diabetes mellitus with hyperglycemia: Secondary | ICD-10-CM | POA: Diagnosis not present

## 2018-05-07 DIAGNOSIS — I130 Hypertensive heart and chronic kidney disease with heart failure and stage 1 through stage 4 chronic kidney disease, or unspecified chronic kidney disease: Secondary | ICD-10-CM | POA: Diagnosis not present

## 2018-05-07 DIAGNOSIS — I5042 Chronic combined systolic (congestive) and diastolic (congestive) heart failure: Secondary | ICD-10-CM | POA: Diagnosis not present

## 2018-05-07 DIAGNOSIS — N183 Chronic kidney disease, stage 3 (moderate): Secondary | ICD-10-CM | POA: Diagnosis not present

## 2018-05-07 DIAGNOSIS — Z4800 Encounter for change or removal of nonsurgical wound dressing: Secondary | ICD-10-CM | POA: Diagnosis not present

## 2018-05-07 DIAGNOSIS — Z87891 Personal history of nicotine dependence: Secondary | ICD-10-CM | POA: Diagnosis not present

## 2018-05-07 DIAGNOSIS — Z5181 Encounter for therapeutic drug level monitoring: Secondary | ICD-10-CM | POA: Diagnosis not present

## 2018-05-07 DIAGNOSIS — E1142 Type 2 diabetes mellitus with diabetic polyneuropathy: Secondary | ICD-10-CM | POA: Diagnosis not present

## 2018-05-08 DIAGNOSIS — Z5181 Encounter for therapeutic drug level monitoring: Secondary | ICD-10-CM | POA: Diagnosis not present

## 2018-05-08 DIAGNOSIS — I5042 Chronic combined systolic (congestive) and diastolic (congestive) heart failure: Secondary | ICD-10-CM | POA: Diagnosis not present

## 2018-05-08 DIAGNOSIS — Z87891 Personal history of nicotine dependence: Secondary | ICD-10-CM | POA: Diagnosis not present

## 2018-05-08 DIAGNOSIS — E1142 Type 2 diabetes mellitus with diabetic polyneuropathy: Secondary | ICD-10-CM | POA: Diagnosis not present

## 2018-05-08 DIAGNOSIS — M1612 Unilateral primary osteoarthritis, left hip: Secondary | ICD-10-CM | POA: Diagnosis not present

## 2018-05-08 DIAGNOSIS — L89623 Pressure ulcer of left heel, stage 3: Secondary | ICD-10-CM | POA: Diagnosis not present

## 2018-05-08 DIAGNOSIS — Z4800 Encounter for change or removal of nonsurgical wound dressing: Secondary | ICD-10-CM | POA: Diagnosis not present

## 2018-05-08 DIAGNOSIS — E1122 Type 2 diabetes mellitus with diabetic chronic kidney disease: Secondary | ICD-10-CM | POA: Diagnosis not present

## 2018-05-08 DIAGNOSIS — I4891 Unspecified atrial fibrillation: Secondary | ICD-10-CM | POA: Diagnosis not present

## 2018-05-08 DIAGNOSIS — N183 Chronic kidney disease, stage 3 (moderate): Secondary | ICD-10-CM | POA: Diagnosis not present

## 2018-05-08 DIAGNOSIS — Z7901 Long term (current) use of anticoagulants: Secondary | ICD-10-CM | POA: Diagnosis not present

## 2018-05-08 DIAGNOSIS — E1165 Type 2 diabetes mellitus with hyperglycemia: Secondary | ICD-10-CM | POA: Diagnosis not present

## 2018-05-08 DIAGNOSIS — I130 Hypertensive heart and chronic kidney disease with heart failure and stage 1 through stage 4 chronic kidney disease, or unspecified chronic kidney disease: Secondary | ICD-10-CM | POA: Diagnosis not present

## 2018-05-08 DIAGNOSIS — M62838 Other muscle spasm: Secondary | ICD-10-CM | POA: Diagnosis not present

## 2018-05-11 DIAGNOSIS — I130 Hypertensive heart and chronic kidney disease with heart failure and stage 1 through stage 4 chronic kidney disease, or unspecified chronic kidney disease: Secondary | ICD-10-CM | POA: Diagnosis not present

## 2018-05-11 DIAGNOSIS — L89623 Pressure ulcer of left heel, stage 3: Secondary | ICD-10-CM | POA: Diagnosis not present

## 2018-05-11 DIAGNOSIS — E1122 Type 2 diabetes mellitus with diabetic chronic kidney disease: Secondary | ICD-10-CM | POA: Diagnosis not present

## 2018-05-11 DIAGNOSIS — Z7901 Long term (current) use of anticoagulants: Secondary | ICD-10-CM | POA: Diagnosis not present

## 2018-05-11 DIAGNOSIS — Z5181 Encounter for therapeutic drug level monitoring: Secondary | ICD-10-CM | POA: Diagnosis not present

## 2018-05-11 DIAGNOSIS — N183 Chronic kidney disease, stage 3 (moderate): Secondary | ICD-10-CM | POA: Diagnosis not present

## 2018-05-11 DIAGNOSIS — E1142 Type 2 diabetes mellitus with diabetic polyneuropathy: Secondary | ICD-10-CM | POA: Diagnosis not present

## 2018-05-11 DIAGNOSIS — M1612 Unilateral primary osteoarthritis, left hip: Secondary | ICD-10-CM | POA: Diagnosis not present

## 2018-05-11 DIAGNOSIS — Z87891 Personal history of nicotine dependence: Secondary | ICD-10-CM | POA: Diagnosis not present

## 2018-05-11 DIAGNOSIS — I5042 Chronic combined systolic (congestive) and diastolic (congestive) heart failure: Secondary | ICD-10-CM | POA: Diagnosis not present

## 2018-05-11 DIAGNOSIS — I4891 Unspecified atrial fibrillation: Secondary | ICD-10-CM | POA: Diagnosis not present

## 2018-05-11 DIAGNOSIS — M62838 Other muscle spasm: Secondary | ICD-10-CM | POA: Diagnosis not present

## 2018-05-11 DIAGNOSIS — E1165 Type 2 diabetes mellitus with hyperglycemia: Secondary | ICD-10-CM | POA: Diagnosis not present

## 2018-05-11 DIAGNOSIS — Z4800 Encounter for change or removal of nonsurgical wound dressing: Secondary | ICD-10-CM | POA: Diagnosis not present

## 2018-05-12 DIAGNOSIS — E1142 Type 2 diabetes mellitus with diabetic polyneuropathy: Secondary | ICD-10-CM | POA: Diagnosis not present

## 2018-05-12 DIAGNOSIS — M62838 Other muscle spasm: Secondary | ICD-10-CM | POA: Diagnosis not present

## 2018-05-12 DIAGNOSIS — M1612 Unilateral primary osteoarthritis, left hip: Secondary | ICD-10-CM | POA: Diagnosis not present

## 2018-05-12 DIAGNOSIS — Z5181 Encounter for therapeutic drug level monitoring: Secondary | ICD-10-CM | POA: Diagnosis not present

## 2018-05-12 DIAGNOSIS — E1165 Type 2 diabetes mellitus with hyperglycemia: Secondary | ICD-10-CM | POA: Diagnosis not present

## 2018-05-12 DIAGNOSIS — N183 Chronic kidney disease, stage 3 (moderate): Secondary | ICD-10-CM | POA: Diagnosis not present

## 2018-05-12 DIAGNOSIS — Z4800 Encounter for change or removal of nonsurgical wound dressing: Secondary | ICD-10-CM | POA: Diagnosis not present

## 2018-05-12 DIAGNOSIS — I130 Hypertensive heart and chronic kidney disease with heart failure and stage 1 through stage 4 chronic kidney disease, or unspecified chronic kidney disease: Secondary | ICD-10-CM | POA: Diagnosis not present

## 2018-05-12 DIAGNOSIS — Z87891 Personal history of nicotine dependence: Secondary | ICD-10-CM | POA: Diagnosis not present

## 2018-05-12 DIAGNOSIS — I5042 Chronic combined systolic (congestive) and diastolic (congestive) heart failure: Secondary | ICD-10-CM | POA: Diagnosis not present

## 2018-05-12 DIAGNOSIS — E1122 Type 2 diabetes mellitus with diabetic chronic kidney disease: Secondary | ICD-10-CM | POA: Diagnosis not present

## 2018-05-12 DIAGNOSIS — Z7901 Long term (current) use of anticoagulants: Secondary | ICD-10-CM | POA: Diagnosis not present

## 2018-05-12 DIAGNOSIS — L89623 Pressure ulcer of left heel, stage 3: Secondary | ICD-10-CM | POA: Diagnosis not present

## 2018-05-12 DIAGNOSIS — I4891 Unspecified atrial fibrillation: Secondary | ICD-10-CM | POA: Diagnosis not present

## 2018-05-14 DIAGNOSIS — M1612 Unilateral primary osteoarthritis, left hip: Secondary | ICD-10-CM | POA: Diagnosis not present

## 2018-05-14 DIAGNOSIS — N183 Chronic kidney disease, stage 3 (moderate): Secondary | ICD-10-CM | POA: Diagnosis not present

## 2018-05-14 DIAGNOSIS — E1122 Type 2 diabetes mellitus with diabetic chronic kidney disease: Secondary | ICD-10-CM | POA: Diagnosis not present

## 2018-05-14 DIAGNOSIS — E1165 Type 2 diabetes mellitus with hyperglycemia: Secondary | ICD-10-CM | POA: Diagnosis not present

## 2018-05-14 DIAGNOSIS — Z87891 Personal history of nicotine dependence: Secondary | ICD-10-CM | POA: Diagnosis not present

## 2018-05-14 DIAGNOSIS — Z7901 Long term (current) use of anticoagulants: Secondary | ICD-10-CM | POA: Diagnosis not present

## 2018-05-14 DIAGNOSIS — Z5181 Encounter for therapeutic drug level monitoring: Secondary | ICD-10-CM | POA: Diagnosis not present

## 2018-05-14 DIAGNOSIS — L89623 Pressure ulcer of left heel, stage 3: Secondary | ICD-10-CM | POA: Diagnosis not present

## 2018-05-14 DIAGNOSIS — I130 Hypertensive heart and chronic kidney disease with heart failure and stage 1 through stage 4 chronic kidney disease, or unspecified chronic kidney disease: Secondary | ICD-10-CM | POA: Diagnosis not present

## 2018-05-14 DIAGNOSIS — E1142 Type 2 diabetes mellitus with diabetic polyneuropathy: Secondary | ICD-10-CM | POA: Diagnosis not present

## 2018-05-14 DIAGNOSIS — I4891 Unspecified atrial fibrillation: Secondary | ICD-10-CM | POA: Diagnosis not present

## 2018-05-14 DIAGNOSIS — M62838 Other muscle spasm: Secondary | ICD-10-CM | POA: Diagnosis not present

## 2018-05-14 DIAGNOSIS — Z4800 Encounter for change or removal of nonsurgical wound dressing: Secondary | ICD-10-CM | POA: Diagnosis not present

## 2018-05-14 DIAGNOSIS — I5042 Chronic combined systolic (congestive) and diastolic (congestive) heart failure: Secondary | ICD-10-CM | POA: Diagnosis not present

## 2018-05-15 DIAGNOSIS — M62838 Other muscle spasm: Secondary | ICD-10-CM | POA: Diagnosis not present

## 2018-05-15 DIAGNOSIS — E1165 Type 2 diabetes mellitus with hyperglycemia: Secondary | ICD-10-CM | POA: Diagnosis not present

## 2018-05-15 DIAGNOSIS — I4891 Unspecified atrial fibrillation: Secondary | ICD-10-CM | POA: Diagnosis not present

## 2018-05-15 DIAGNOSIS — L89623 Pressure ulcer of left heel, stage 3: Secondary | ICD-10-CM | POA: Diagnosis not present

## 2018-05-15 DIAGNOSIS — Z87891 Personal history of nicotine dependence: Secondary | ICD-10-CM | POA: Diagnosis not present

## 2018-05-15 DIAGNOSIS — E1122 Type 2 diabetes mellitus with diabetic chronic kidney disease: Secondary | ICD-10-CM | POA: Diagnosis not present

## 2018-05-15 DIAGNOSIS — Z4800 Encounter for change or removal of nonsurgical wound dressing: Secondary | ICD-10-CM | POA: Diagnosis not present

## 2018-05-15 DIAGNOSIS — Z5181 Encounter for therapeutic drug level monitoring: Secondary | ICD-10-CM | POA: Diagnosis not present

## 2018-05-15 DIAGNOSIS — N183 Chronic kidney disease, stage 3 (moderate): Secondary | ICD-10-CM | POA: Diagnosis not present

## 2018-05-15 DIAGNOSIS — M1612 Unilateral primary osteoarthritis, left hip: Secondary | ICD-10-CM | POA: Diagnosis not present

## 2018-05-15 DIAGNOSIS — I5042 Chronic combined systolic (congestive) and diastolic (congestive) heart failure: Secondary | ICD-10-CM | POA: Diagnosis not present

## 2018-05-15 DIAGNOSIS — Z7901 Long term (current) use of anticoagulants: Secondary | ICD-10-CM | POA: Diagnosis not present

## 2018-05-15 DIAGNOSIS — E1142 Type 2 diabetes mellitus with diabetic polyneuropathy: Secondary | ICD-10-CM | POA: Diagnosis not present

## 2018-05-15 DIAGNOSIS — I130 Hypertensive heart and chronic kidney disease with heart failure and stage 1 through stage 4 chronic kidney disease, or unspecified chronic kidney disease: Secondary | ICD-10-CM | POA: Diagnosis not present

## 2018-05-18 DIAGNOSIS — I130 Hypertensive heart and chronic kidney disease with heart failure and stage 1 through stage 4 chronic kidney disease, or unspecified chronic kidney disease: Secondary | ICD-10-CM | POA: Diagnosis not present

## 2018-05-18 DIAGNOSIS — E1165 Type 2 diabetes mellitus with hyperglycemia: Secondary | ICD-10-CM | POA: Diagnosis not present

## 2018-05-18 DIAGNOSIS — I5042 Chronic combined systolic (congestive) and diastolic (congestive) heart failure: Secondary | ICD-10-CM | POA: Diagnosis not present

## 2018-05-18 DIAGNOSIS — E1122 Type 2 diabetes mellitus with diabetic chronic kidney disease: Secondary | ICD-10-CM | POA: Diagnosis not present

## 2018-05-18 DIAGNOSIS — Z4800 Encounter for change or removal of nonsurgical wound dressing: Secondary | ICD-10-CM | POA: Diagnosis not present

## 2018-05-18 DIAGNOSIS — M1612 Unilateral primary osteoarthritis, left hip: Secondary | ICD-10-CM | POA: Diagnosis not present

## 2018-05-18 DIAGNOSIS — Z87891 Personal history of nicotine dependence: Secondary | ICD-10-CM | POA: Diagnosis not present

## 2018-05-18 DIAGNOSIS — M62838 Other muscle spasm: Secondary | ICD-10-CM | POA: Diagnosis not present

## 2018-05-18 DIAGNOSIS — E1142 Type 2 diabetes mellitus with diabetic polyneuropathy: Secondary | ICD-10-CM | POA: Diagnosis not present

## 2018-05-18 DIAGNOSIS — Z7901 Long term (current) use of anticoagulants: Secondary | ICD-10-CM | POA: Diagnosis not present

## 2018-05-18 DIAGNOSIS — L89623 Pressure ulcer of left heel, stage 3: Secondary | ICD-10-CM | POA: Diagnosis not present

## 2018-05-18 DIAGNOSIS — N183 Chronic kidney disease, stage 3 (moderate): Secondary | ICD-10-CM | POA: Diagnosis not present

## 2018-05-18 DIAGNOSIS — Z5181 Encounter for therapeutic drug level monitoring: Secondary | ICD-10-CM | POA: Diagnosis not present

## 2018-05-18 DIAGNOSIS — I4891 Unspecified atrial fibrillation: Secondary | ICD-10-CM | POA: Diagnosis not present

## 2018-05-19 DIAGNOSIS — Z5181 Encounter for therapeutic drug level monitoring: Secondary | ICD-10-CM | POA: Diagnosis not present

## 2018-05-19 DIAGNOSIS — M1612 Unilateral primary osteoarthritis, left hip: Secondary | ICD-10-CM | POA: Diagnosis not present

## 2018-05-19 DIAGNOSIS — Z4800 Encounter for change or removal of nonsurgical wound dressing: Secondary | ICD-10-CM | POA: Diagnosis not present

## 2018-05-19 DIAGNOSIS — Z7901 Long term (current) use of anticoagulants: Secondary | ICD-10-CM | POA: Diagnosis not present

## 2018-05-19 DIAGNOSIS — E1165 Type 2 diabetes mellitus with hyperglycemia: Secondary | ICD-10-CM | POA: Diagnosis not present

## 2018-05-19 DIAGNOSIS — I5042 Chronic combined systolic (congestive) and diastolic (congestive) heart failure: Secondary | ICD-10-CM | POA: Diagnosis not present

## 2018-05-19 DIAGNOSIS — E1122 Type 2 diabetes mellitus with diabetic chronic kidney disease: Secondary | ICD-10-CM | POA: Diagnosis not present

## 2018-05-19 DIAGNOSIS — I130 Hypertensive heart and chronic kidney disease with heart failure and stage 1 through stage 4 chronic kidney disease, or unspecified chronic kidney disease: Secondary | ICD-10-CM | POA: Diagnosis not present

## 2018-05-19 DIAGNOSIS — I4891 Unspecified atrial fibrillation: Secondary | ICD-10-CM | POA: Diagnosis not present

## 2018-05-19 DIAGNOSIS — M62838 Other muscle spasm: Secondary | ICD-10-CM | POA: Diagnosis not present

## 2018-05-19 DIAGNOSIS — Z87891 Personal history of nicotine dependence: Secondary | ICD-10-CM | POA: Diagnosis not present

## 2018-05-19 DIAGNOSIS — N183 Chronic kidney disease, stage 3 (moderate): Secondary | ICD-10-CM | POA: Diagnosis not present

## 2018-05-19 DIAGNOSIS — E1142 Type 2 diabetes mellitus with diabetic polyneuropathy: Secondary | ICD-10-CM | POA: Diagnosis not present

## 2018-05-19 DIAGNOSIS — L89623 Pressure ulcer of left heel, stage 3: Secondary | ICD-10-CM | POA: Diagnosis not present

## 2018-05-20 DIAGNOSIS — L89623 Pressure ulcer of left heel, stage 3: Secondary | ICD-10-CM | POA: Diagnosis not present

## 2018-05-20 DIAGNOSIS — E1165 Type 2 diabetes mellitus with hyperglycemia: Secondary | ICD-10-CM | POA: Diagnosis not present

## 2018-05-20 DIAGNOSIS — E1142 Type 2 diabetes mellitus with diabetic polyneuropathy: Secondary | ICD-10-CM | POA: Diagnosis not present

## 2018-05-20 DIAGNOSIS — Z7901 Long term (current) use of anticoagulants: Secondary | ICD-10-CM | POA: Diagnosis not present

## 2018-05-20 DIAGNOSIS — I4891 Unspecified atrial fibrillation: Secondary | ICD-10-CM | POA: Diagnosis not present

## 2018-05-20 DIAGNOSIS — N183 Chronic kidney disease, stage 3 (moderate): Secondary | ICD-10-CM | POA: Diagnosis not present

## 2018-05-20 DIAGNOSIS — Z5181 Encounter for therapeutic drug level monitoring: Secondary | ICD-10-CM | POA: Diagnosis not present

## 2018-05-20 DIAGNOSIS — Z4800 Encounter for change or removal of nonsurgical wound dressing: Secondary | ICD-10-CM | POA: Diagnosis not present

## 2018-05-20 DIAGNOSIS — M1612 Unilateral primary osteoarthritis, left hip: Secondary | ICD-10-CM | POA: Diagnosis not present

## 2018-05-20 DIAGNOSIS — Z87891 Personal history of nicotine dependence: Secondary | ICD-10-CM | POA: Diagnosis not present

## 2018-05-20 DIAGNOSIS — I130 Hypertensive heart and chronic kidney disease with heart failure and stage 1 through stage 4 chronic kidney disease, or unspecified chronic kidney disease: Secondary | ICD-10-CM | POA: Diagnosis not present

## 2018-05-20 DIAGNOSIS — I5042 Chronic combined systolic (congestive) and diastolic (congestive) heart failure: Secondary | ICD-10-CM | POA: Diagnosis not present

## 2018-05-20 DIAGNOSIS — M62838 Other muscle spasm: Secondary | ICD-10-CM | POA: Diagnosis not present

## 2018-05-20 DIAGNOSIS — E1122 Type 2 diabetes mellitus with diabetic chronic kidney disease: Secondary | ICD-10-CM | POA: Diagnosis not present

## 2018-05-22 DIAGNOSIS — N183 Chronic kidney disease, stage 3 (moderate): Secondary | ICD-10-CM | POA: Diagnosis not present

## 2018-05-22 DIAGNOSIS — M62838 Other muscle spasm: Secondary | ICD-10-CM | POA: Diagnosis not present

## 2018-05-22 DIAGNOSIS — Z87891 Personal history of nicotine dependence: Secondary | ICD-10-CM | POA: Diagnosis not present

## 2018-05-22 DIAGNOSIS — L89623 Pressure ulcer of left heel, stage 3: Secondary | ICD-10-CM | POA: Diagnosis not present

## 2018-05-22 DIAGNOSIS — E1142 Type 2 diabetes mellitus with diabetic polyneuropathy: Secondary | ICD-10-CM | POA: Diagnosis not present

## 2018-05-22 DIAGNOSIS — I4891 Unspecified atrial fibrillation: Secondary | ICD-10-CM | POA: Diagnosis not present

## 2018-05-22 DIAGNOSIS — Z7901 Long term (current) use of anticoagulants: Secondary | ICD-10-CM | POA: Diagnosis not present

## 2018-05-22 DIAGNOSIS — M1612 Unilateral primary osteoarthritis, left hip: Secondary | ICD-10-CM | POA: Diagnosis not present

## 2018-05-22 DIAGNOSIS — I130 Hypertensive heart and chronic kidney disease with heart failure and stage 1 through stage 4 chronic kidney disease, or unspecified chronic kidney disease: Secondary | ICD-10-CM | POA: Diagnosis not present

## 2018-05-22 DIAGNOSIS — Z4800 Encounter for change or removal of nonsurgical wound dressing: Secondary | ICD-10-CM | POA: Diagnosis not present

## 2018-05-22 DIAGNOSIS — E1122 Type 2 diabetes mellitus with diabetic chronic kidney disease: Secondary | ICD-10-CM | POA: Diagnosis not present

## 2018-05-22 DIAGNOSIS — I5042 Chronic combined systolic (congestive) and diastolic (congestive) heart failure: Secondary | ICD-10-CM | POA: Diagnosis not present

## 2018-05-22 DIAGNOSIS — Z5181 Encounter for therapeutic drug level monitoring: Secondary | ICD-10-CM | POA: Diagnosis not present

## 2018-05-22 DIAGNOSIS — E1165 Type 2 diabetes mellitus with hyperglycemia: Secondary | ICD-10-CM | POA: Diagnosis not present

## 2018-05-26 DIAGNOSIS — E1142 Type 2 diabetes mellitus with diabetic polyneuropathy: Secondary | ICD-10-CM | POA: Diagnosis not present

## 2018-05-26 DIAGNOSIS — N183 Chronic kidney disease, stage 3 (moderate): Secondary | ICD-10-CM | POA: Diagnosis not present

## 2018-05-26 DIAGNOSIS — Z4800 Encounter for change or removal of nonsurgical wound dressing: Secondary | ICD-10-CM | POA: Diagnosis not present

## 2018-05-26 DIAGNOSIS — E1122 Type 2 diabetes mellitus with diabetic chronic kidney disease: Secondary | ICD-10-CM | POA: Diagnosis not present

## 2018-05-26 DIAGNOSIS — E1165 Type 2 diabetes mellitus with hyperglycemia: Secondary | ICD-10-CM | POA: Diagnosis not present

## 2018-05-26 DIAGNOSIS — Z5181 Encounter for therapeutic drug level monitoring: Secondary | ICD-10-CM | POA: Diagnosis not present

## 2018-05-26 DIAGNOSIS — I130 Hypertensive heart and chronic kidney disease with heart failure and stage 1 through stage 4 chronic kidney disease, or unspecified chronic kidney disease: Secondary | ICD-10-CM | POA: Diagnosis not present

## 2018-05-26 DIAGNOSIS — L89623 Pressure ulcer of left heel, stage 3: Secondary | ICD-10-CM | POA: Diagnosis not present

## 2018-05-26 DIAGNOSIS — M62838 Other muscle spasm: Secondary | ICD-10-CM | POA: Diagnosis not present

## 2018-05-26 DIAGNOSIS — Z7901 Long term (current) use of anticoagulants: Secondary | ICD-10-CM | POA: Diagnosis not present

## 2018-05-26 DIAGNOSIS — I4891 Unspecified atrial fibrillation: Secondary | ICD-10-CM | POA: Diagnosis not present

## 2018-05-26 DIAGNOSIS — I5042 Chronic combined systolic (congestive) and diastolic (congestive) heart failure: Secondary | ICD-10-CM | POA: Diagnosis not present

## 2018-05-26 DIAGNOSIS — Z87891 Personal history of nicotine dependence: Secondary | ICD-10-CM | POA: Diagnosis not present

## 2018-05-26 DIAGNOSIS — M1612 Unilateral primary osteoarthritis, left hip: Secondary | ICD-10-CM | POA: Diagnosis not present

## 2018-05-27 DIAGNOSIS — M1612 Unilateral primary osteoarthritis, left hip: Secondary | ICD-10-CM | POA: Diagnosis not present

## 2018-05-27 DIAGNOSIS — N183 Chronic kidney disease, stage 3 (moderate): Secondary | ICD-10-CM | POA: Diagnosis not present

## 2018-05-27 DIAGNOSIS — E1165 Type 2 diabetes mellitus with hyperglycemia: Secondary | ICD-10-CM | POA: Diagnosis not present

## 2018-05-27 DIAGNOSIS — Z87891 Personal history of nicotine dependence: Secondary | ICD-10-CM | POA: Diagnosis not present

## 2018-05-27 DIAGNOSIS — M62838 Other muscle spasm: Secondary | ICD-10-CM | POA: Diagnosis not present

## 2018-05-27 DIAGNOSIS — E1142 Type 2 diabetes mellitus with diabetic polyneuropathy: Secondary | ICD-10-CM | POA: Diagnosis not present

## 2018-05-27 DIAGNOSIS — Z5181 Encounter for therapeutic drug level monitoring: Secondary | ICD-10-CM | POA: Diagnosis not present

## 2018-05-27 DIAGNOSIS — Z4800 Encounter for change or removal of nonsurgical wound dressing: Secondary | ICD-10-CM | POA: Diagnosis not present

## 2018-05-27 DIAGNOSIS — E1122 Type 2 diabetes mellitus with diabetic chronic kidney disease: Secondary | ICD-10-CM | POA: Diagnosis not present

## 2018-05-27 DIAGNOSIS — I4891 Unspecified atrial fibrillation: Secondary | ICD-10-CM | POA: Diagnosis not present

## 2018-05-27 DIAGNOSIS — I5042 Chronic combined systolic (congestive) and diastolic (congestive) heart failure: Secondary | ICD-10-CM | POA: Diagnosis not present

## 2018-05-27 DIAGNOSIS — L89623 Pressure ulcer of left heel, stage 3: Secondary | ICD-10-CM | POA: Diagnosis not present

## 2018-05-27 DIAGNOSIS — Z7901 Long term (current) use of anticoagulants: Secondary | ICD-10-CM | POA: Diagnosis not present

## 2018-05-27 DIAGNOSIS — I130 Hypertensive heart and chronic kidney disease with heart failure and stage 1 through stage 4 chronic kidney disease, or unspecified chronic kidney disease: Secondary | ICD-10-CM | POA: Diagnosis not present

## 2018-05-29 DIAGNOSIS — I4891 Unspecified atrial fibrillation: Secondary | ICD-10-CM | POA: Diagnosis not present

## 2018-05-29 DIAGNOSIS — I5042 Chronic combined systolic (congestive) and diastolic (congestive) heart failure: Secondary | ICD-10-CM | POA: Diagnosis not present

## 2018-05-29 DIAGNOSIS — Z4800 Encounter for change or removal of nonsurgical wound dressing: Secondary | ICD-10-CM | POA: Diagnosis not present

## 2018-05-29 DIAGNOSIS — E1122 Type 2 diabetes mellitus with diabetic chronic kidney disease: Secondary | ICD-10-CM | POA: Diagnosis not present

## 2018-05-29 DIAGNOSIS — Z87891 Personal history of nicotine dependence: Secondary | ICD-10-CM | POA: Diagnosis not present

## 2018-05-29 DIAGNOSIS — M1612 Unilateral primary osteoarthritis, left hip: Secondary | ICD-10-CM | POA: Diagnosis not present

## 2018-05-29 DIAGNOSIS — E1165 Type 2 diabetes mellitus with hyperglycemia: Secondary | ICD-10-CM | POA: Diagnosis not present

## 2018-05-29 DIAGNOSIS — Z7901 Long term (current) use of anticoagulants: Secondary | ICD-10-CM | POA: Diagnosis not present

## 2018-05-29 DIAGNOSIS — I130 Hypertensive heart and chronic kidney disease with heart failure and stage 1 through stage 4 chronic kidney disease, or unspecified chronic kidney disease: Secondary | ICD-10-CM | POA: Diagnosis not present

## 2018-05-29 DIAGNOSIS — Z5181 Encounter for therapeutic drug level monitoring: Secondary | ICD-10-CM | POA: Diagnosis not present

## 2018-05-29 DIAGNOSIS — E1142 Type 2 diabetes mellitus with diabetic polyneuropathy: Secondary | ICD-10-CM | POA: Diagnosis not present

## 2018-05-29 DIAGNOSIS — N183 Chronic kidney disease, stage 3 (moderate): Secondary | ICD-10-CM | POA: Diagnosis not present

## 2018-05-29 DIAGNOSIS — L89623 Pressure ulcer of left heel, stage 3: Secondary | ICD-10-CM | POA: Diagnosis not present

## 2018-05-29 DIAGNOSIS — M62838 Other muscle spasm: Secondary | ICD-10-CM | POA: Diagnosis not present

## 2018-06-02 DIAGNOSIS — Z87891 Personal history of nicotine dependence: Secondary | ICD-10-CM | POA: Diagnosis not present

## 2018-06-02 DIAGNOSIS — Z4800 Encounter for change or removal of nonsurgical wound dressing: Secondary | ICD-10-CM | POA: Diagnosis not present

## 2018-06-02 DIAGNOSIS — N183 Chronic kidney disease, stage 3 (moderate): Secondary | ICD-10-CM | POA: Diagnosis not present

## 2018-06-02 DIAGNOSIS — M62838 Other muscle spasm: Secondary | ICD-10-CM | POA: Diagnosis not present

## 2018-06-02 DIAGNOSIS — I5042 Chronic combined systolic (congestive) and diastolic (congestive) heart failure: Secondary | ICD-10-CM | POA: Diagnosis not present

## 2018-06-02 DIAGNOSIS — E1122 Type 2 diabetes mellitus with diabetic chronic kidney disease: Secondary | ICD-10-CM | POA: Diagnosis not present

## 2018-06-02 DIAGNOSIS — I4891 Unspecified atrial fibrillation: Secondary | ICD-10-CM | POA: Diagnosis not present

## 2018-06-02 DIAGNOSIS — M1612 Unilateral primary osteoarthritis, left hip: Secondary | ICD-10-CM | POA: Diagnosis not present

## 2018-06-02 DIAGNOSIS — I130 Hypertensive heart and chronic kidney disease with heart failure and stage 1 through stage 4 chronic kidney disease, or unspecified chronic kidney disease: Secondary | ICD-10-CM | POA: Diagnosis not present

## 2018-06-02 DIAGNOSIS — E1142 Type 2 diabetes mellitus with diabetic polyneuropathy: Secondary | ICD-10-CM | POA: Diagnosis not present

## 2018-06-02 DIAGNOSIS — Z7901 Long term (current) use of anticoagulants: Secondary | ICD-10-CM | POA: Diagnosis not present

## 2018-06-02 DIAGNOSIS — Z5181 Encounter for therapeutic drug level monitoring: Secondary | ICD-10-CM | POA: Diagnosis not present

## 2018-06-02 DIAGNOSIS — L89623 Pressure ulcer of left heel, stage 3: Secondary | ICD-10-CM | POA: Diagnosis not present

## 2018-06-02 DIAGNOSIS — E1165 Type 2 diabetes mellitus with hyperglycemia: Secondary | ICD-10-CM | POA: Diagnosis not present

## 2018-06-03 DIAGNOSIS — M62838 Other muscle spasm: Secondary | ICD-10-CM | POA: Diagnosis not present

## 2018-06-03 DIAGNOSIS — Z4800 Encounter for change or removal of nonsurgical wound dressing: Secondary | ICD-10-CM | POA: Diagnosis not present

## 2018-06-03 DIAGNOSIS — E1165 Type 2 diabetes mellitus with hyperglycemia: Secondary | ICD-10-CM | POA: Diagnosis not present

## 2018-06-03 DIAGNOSIS — I5042 Chronic combined systolic (congestive) and diastolic (congestive) heart failure: Secondary | ICD-10-CM | POA: Diagnosis not present

## 2018-06-03 DIAGNOSIS — I130 Hypertensive heart and chronic kidney disease with heart failure and stage 1 through stage 4 chronic kidney disease, or unspecified chronic kidney disease: Secondary | ICD-10-CM | POA: Diagnosis not present

## 2018-06-03 DIAGNOSIS — E1142 Type 2 diabetes mellitus with diabetic polyneuropathy: Secondary | ICD-10-CM | POA: Diagnosis not present

## 2018-06-03 DIAGNOSIS — N183 Chronic kidney disease, stage 3 (moderate): Secondary | ICD-10-CM | POA: Diagnosis not present

## 2018-06-03 DIAGNOSIS — E1122 Type 2 diabetes mellitus with diabetic chronic kidney disease: Secondary | ICD-10-CM | POA: Diagnosis not present

## 2018-06-03 DIAGNOSIS — Z5181 Encounter for therapeutic drug level monitoring: Secondary | ICD-10-CM | POA: Diagnosis not present

## 2018-06-03 DIAGNOSIS — Z87891 Personal history of nicotine dependence: Secondary | ICD-10-CM | POA: Diagnosis not present

## 2018-06-03 DIAGNOSIS — M1612 Unilateral primary osteoarthritis, left hip: Secondary | ICD-10-CM | POA: Diagnosis not present

## 2018-06-03 DIAGNOSIS — Z7901 Long term (current) use of anticoagulants: Secondary | ICD-10-CM | POA: Diagnosis not present

## 2018-06-03 DIAGNOSIS — L89623 Pressure ulcer of left heel, stage 3: Secondary | ICD-10-CM | POA: Diagnosis not present

## 2018-06-03 DIAGNOSIS — I4891 Unspecified atrial fibrillation: Secondary | ICD-10-CM | POA: Diagnosis not present

## 2018-06-05 DIAGNOSIS — I5042 Chronic combined systolic (congestive) and diastolic (congestive) heart failure: Secondary | ICD-10-CM | POA: Diagnosis not present

## 2018-06-05 DIAGNOSIS — M62838 Other muscle spasm: Secondary | ICD-10-CM | POA: Diagnosis not present

## 2018-06-05 DIAGNOSIS — Z87891 Personal history of nicotine dependence: Secondary | ICD-10-CM | POA: Diagnosis not present

## 2018-06-05 DIAGNOSIS — E1142 Type 2 diabetes mellitus with diabetic polyneuropathy: Secondary | ICD-10-CM | POA: Diagnosis not present

## 2018-06-05 DIAGNOSIS — L89623 Pressure ulcer of left heel, stage 3: Secondary | ICD-10-CM | POA: Diagnosis not present

## 2018-06-05 DIAGNOSIS — E1122 Type 2 diabetes mellitus with diabetic chronic kidney disease: Secondary | ICD-10-CM | POA: Diagnosis not present

## 2018-06-05 DIAGNOSIS — I4891 Unspecified atrial fibrillation: Secondary | ICD-10-CM | POA: Diagnosis not present

## 2018-06-05 DIAGNOSIS — Z4800 Encounter for change or removal of nonsurgical wound dressing: Secondary | ICD-10-CM | POA: Diagnosis not present

## 2018-06-05 DIAGNOSIS — Z5181 Encounter for therapeutic drug level monitoring: Secondary | ICD-10-CM | POA: Diagnosis not present

## 2018-06-05 DIAGNOSIS — Z7901 Long term (current) use of anticoagulants: Secondary | ICD-10-CM | POA: Diagnosis not present

## 2018-06-05 DIAGNOSIS — N183 Chronic kidney disease, stage 3 (moderate): Secondary | ICD-10-CM | POA: Diagnosis not present

## 2018-06-05 DIAGNOSIS — E1165 Type 2 diabetes mellitus with hyperglycemia: Secondary | ICD-10-CM | POA: Diagnosis not present

## 2018-06-05 DIAGNOSIS — I130 Hypertensive heart and chronic kidney disease with heart failure and stage 1 through stage 4 chronic kidney disease, or unspecified chronic kidney disease: Secondary | ICD-10-CM | POA: Diagnosis not present

## 2018-06-05 DIAGNOSIS — M1612 Unilateral primary osteoarthritis, left hip: Secondary | ICD-10-CM | POA: Diagnosis not present

## 2018-06-09 DIAGNOSIS — M1612 Unilateral primary osteoarthritis, left hip: Secondary | ICD-10-CM | POA: Diagnosis not present

## 2018-06-09 DIAGNOSIS — I4891 Unspecified atrial fibrillation: Secondary | ICD-10-CM | POA: Diagnosis not present

## 2018-06-09 DIAGNOSIS — E1122 Type 2 diabetes mellitus with diabetic chronic kidney disease: Secondary | ICD-10-CM | POA: Diagnosis not present

## 2018-06-09 DIAGNOSIS — Z7901 Long term (current) use of anticoagulants: Secondary | ICD-10-CM | POA: Diagnosis not present

## 2018-06-09 DIAGNOSIS — Z4800 Encounter for change or removal of nonsurgical wound dressing: Secondary | ICD-10-CM | POA: Diagnosis not present

## 2018-06-09 DIAGNOSIS — N183 Chronic kidney disease, stage 3 (moderate): Secondary | ICD-10-CM | POA: Diagnosis not present

## 2018-06-09 DIAGNOSIS — L89623 Pressure ulcer of left heel, stage 3: Secondary | ICD-10-CM | POA: Diagnosis not present

## 2018-06-09 DIAGNOSIS — E1142 Type 2 diabetes mellitus with diabetic polyneuropathy: Secondary | ICD-10-CM | POA: Diagnosis not present

## 2018-06-09 DIAGNOSIS — Z5181 Encounter for therapeutic drug level monitoring: Secondary | ICD-10-CM | POA: Diagnosis not present

## 2018-06-09 DIAGNOSIS — Z87891 Personal history of nicotine dependence: Secondary | ICD-10-CM | POA: Diagnosis not present

## 2018-06-09 DIAGNOSIS — E1165 Type 2 diabetes mellitus with hyperglycemia: Secondary | ICD-10-CM | POA: Diagnosis not present

## 2018-06-09 DIAGNOSIS — I5042 Chronic combined systolic (congestive) and diastolic (congestive) heart failure: Secondary | ICD-10-CM | POA: Diagnosis not present

## 2018-06-09 DIAGNOSIS — I130 Hypertensive heart and chronic kidney disease with heart failure and stage 1 through stage 4 chronic kidney disease, or unspecified chronic kidney disease: Secondary | ICD-10-CM | POA: Diagnosis not present

## 2018-06-09 DIAGNOSIS — M62838 Other muscle spasm: Secondary | ICD-10-CM | POA: Diagnosis not present

## 2018-06-12 DIAGNOSIS — Z5181 Encounter for therapeutic drug level monitoring: Secondary | ICD-10-CM | POA: Diagnosis not present

## 2018-06-12 DIAGNOSIS — E1165 Type 2 diabetes mellitus with hyperglycemia: Secondary | ICD-10-CM | POA: Diagnosis not present

## 2018-06-12 DIAGNOSIS — M1612 Unilateral primary osteoarthritis, left hip: Secondary | ICD-10-CM | POA: Diagnosis not present

## 2018-06-12 DIAGNOSIS — E1142 Type 2 diabetes mellitus with diabetic polyneuropathy: Secondary | ICD-10-CM | POA: Diagnosis not present

## 2018-06-12 DIAGNOSIS — I130 Hypertensive heart and chronic kidney disease with heart failure and stage 1 through stage 4 chronic kidney disease, or unspecified chronic kidney disease: Secondary | ICD-10-CM | POA: Diagnosis not present

## 2018-06-12 DIAGNOSIS — E1122 Type 2 diabetes mellitus with diabetic chronic kidney disease: Secondary | ICD-10-CM | POA: Diagnosis not present

## 2018-06-12 DIAGNOSIS — I5042 Chronic combined systolic (congestive) and diastolic (congestive) heart failure: Secondary | ICD-10-CM | POA: Diagnosis not present

## 2018-06-12 DIAGNOSIS — Z4800 Encounter for change or removal of nonsurgical wound dressing: Secondary | ICD-10-CM | POA: Diagnosis not present

## 2018-06-12 DIAGNOSIS — Z87891 Personal history of nicotine dependence: Secondary | ICD-10-CM | POA: Diagnosis not present

## 2018-06-12 DIAGNOSIS — M62838 Other muscle spasm: Secondary | ICD-10-CM | POA: Diagnosis not present

## 2018-06-12 DIAGNOSIS — I4891 Unspecified atrial fibrillation: Secondary | ICD-10-CM | POA: Diagnosis not present

## 2018-06-12 DIAGNOSIS — Z7901 Long term (current) use of anticoagulants: Secondary | ICD-10-CM | POA: Diagnosis not present

## 2018-06-12 DIAGNOSIS — L89623 Pressure ulcer of left heel, stage 3: Secondary | ICD-10-CM | POA: Diagnosis not present

## 2018-06-12 DIAGNOSIS — N183 Chronic kidney disease, stage 3 (moderate): Secondary | ICD-10-CM | POA: Diagnosis not present

## 2018-06-15 DIAGNOSIS — I130 Hypertensive heart and chronic kidney disease with heart failure and stage 1 through stage 4 chronic kidney disease, or unspecified chronic kidney disease: Secondary | ICD-10-CM | POA: Diagnosis not present

## 2018-06-15 DIAGNOSIS — Z4800 Encounter for change or removal of nonsurgical wound dressing: Secondary | ICD-10-CM | POA: Diagnosis not present

## 2018-06-15 DIAGNOSIS — Z5181 Encounter for therapeutic drug level monitoring: Secondary | ICD-10-CM | POA: Diagnosis not present

## 2018-06-15 DIAGNOSIS — M1612 Unilateral primary osteoarthritis, left hip: Secondary | ICD-10-CM | POA: Diagnosis not present

## 2018-06-15 DIAGNOSIS — L89623 Pressure ulcer of left heel, stage 3: Secondary | ICD-10-CM | POA: Diagnosis not present

## 2018-06-15 DIAGNOSIS — E1165 Type 2 diabetes mellitus with hyperglycemia: Secondary | ICD-10-CM | POA: Diagnosis not present

## 2018-06-15 DIAGNOSIS — N183 Chronic kidney disease, stage 3 (moderate): Secondary | ICD-10-CM | POA: Diagnosis not present

## 2018-06-15 DIAGNOSIS — I4891 Unspecified atrial fibrillation: Secondary | ICD-10-CM | POA: Diagnosis not present

## 2018-06-15 DIAGNOSIS — I5042 Chronic combined systolic (congestive) and diastolic (congestive) heart failure: Secondary | ICD-10-CM | POA: Diagnosis not present

## 2018-06-15 DIAGNOSIS — E1142 Type 2 diabetes mellitus with diabetic polyneuropathy: Secondary | ICD-10-CM | POA: Diagnosis not present

## 2018-06-15 DIAGNOSIS — E1122 Type 2 diabetes mellitus with diabetic chronic kidney disease: Secondary | ICD-10-CM | POA: Diagnosis not present

## 2018-06-15 DIAGNOSIS — M62838 Other muscle spasm: Secondary | ICD-10-CM | POA: Diagnosis not present

## 2018-06-15 DIAGNOSIS — Z87891 Personal history of nicotine dependence: Secondary | ICD-10-CM | POA: Diagnosis not present

## 2018-06-15 DIAGNOSIS — Z7901 Long term (current) use of anticoagulants: Secondary | ICD-10-CM | POA: Diagnosis not present

## 2018-06-16 DIAGNOSIS — Z87891 Personal history of nicotine dependence: Secondary | ICD-10-CM | POA: Diagnosis not present

## 2018-06-16 DIAGNOSIS — L89623 Pressure ulcer of left heel, stage 3: Secondary | ICD-10-CM | POA: Diagnosis not present

## 2018-06-16 DIAGNOSIS — I5042 Chronic combined systolic (congestive) and diastolic (congestive) heart failure: Secondary | ICD-10-CM | POA: Diagnosis not present

## 2018-06-16 DIAGNOSIS — I130 Hypertensive heart and chronic kidney disease with heart failure and stage 1 through stage 4 chronic kidney disease, or unspecified chronic kidney disease: Secondary | ICD-10-CM | POA: Diagnosis not present

## 2018-06-16 DIAGNOSIS — E1165 Type 2 diabetes mellitus with hyperglycemia: Secondary | ICD-10-CM | POA: Diagnosis not present

## 2018-06-16 DIAGNOSIS — Z7901 Long term (current) use of anticoagulants: Secondary | ICD-10-CM | POA: Diagnosis not present

## 2018-06-16 DIAGNOSIS — M1612 Unilateral primary osteoarthritis, left hip: Secondary | ICD-10-CM | POA: Diagnosis not present

## 2018-06-16 DIAGNOSIS — E1122 Type 2 diabetes mellitus with diabetic chronic kidney disease: Secondary | ICD-10-CM | POA: Diagnosis not present

## 2018-06-16 DIAGNOSIS — M62838 Other muscle spasm: Secondary | ICD-10-CM | POA: Diagnosis not present

## 2018-06-16 DIAGNOSIS — Z5181 Encounter for therapeutic drug level monitoring: Secondary | ICD-10-CM | POA: Diagnosis not present

## 2018-06-16 DIAGNOSIS — Z4800 Encounter for change or removal of nonsurgical wound dressing: Secondary | ICD-10-CM | POA: Diagnosis not present

## 2018-06-16 DIAGNOSIS — N183 Chronic kidney disease, stage 3 (moderate): Secondary | ICD-10-CM | POA: Diagnosis not present

## 2018-06-16 DIAGNOSIS — E1142 Type 2 diabetes mellitus with diabetic polyneuropathy: Secondary | ICD-10-CM | POA: Diagnosis not present

## 2018-06-16 DIAGNOSIS — I4891 Unspecified atrial fibrillation: Secondary | ICD-10-CM | POA: Diagnosis not present

## 2018-06-19 DIAGNOSIS — E1142 Type 2 diabetes mellitus with diabetic polyneuropathy: Secondary | ICD-10-CM | POA: Diagnosis not present

## 2018-06-19 DIAGNOSIS — Z87891 Personal history of nicotine dependence: Secondary | ICD-10-CM | POA: Diagnosis not present

## 2018-06-19 DIAGNOSIS — N183 Chronic kidney disease, stage 3 (moderate): Secondary | ICD-10-CM | POA: Diagnosis not present

## 2018-06-19 DIAGNOSIS — Z5181 Encounter for therapeutic drug level monitoring: Secondary | ICD-10-CM | POA: Diagnosis not present

## 2018-06-19 DIAGNOSIS — Z4800 Encounter for change or removal of nonsurgical wound dressing: Secondary | ICD-10-CM | POA: Diagnosis not present

## 2018-06-19 DIAGNOSIS — E1165 Type 2 diabetes mellitus with hyperglycemia: Secondary | ICD-10-CM | POA: Diagnosis not present

## 2018-06-19 DIAGNOSIS — E1122 Type 2 diabetes mellitus with diabetic chronic kidney disease: Secondary | ICD-10-CM | POA: Diagnosis not present

## 2018-06-19 DIAGNOSIS — L89623 Pressure ulcer of left heel, stage 3: Secondary | ICD-10-CM | POA: Diagnosis not present

## 2018-06-19 DIAGNOSIS — I130 Hypertensive heart and chronic kidney disease with heart failure and stage 1 through stage 4 chronic kidney disease, or unspecified chronic kidney disease: Secondary | ICD-10-CM | POA: Diagnosis not present

## 2018-06-19 DIAGNOSIS — I5042 Chronic combined systolic (congestive) and diastolic (congestive) heart failure: Secondary | ICD-10-CM | POA: Diagnosis not present

## 2018-06-19 DIAGNOSIS — I4891 Unspecified atrial fibrillation: Secondary | ICD-10-CM | POA: Diagnosis not present

## 2018-06-19 DIAGNOSIS — M62838 Other muscle spasm: Secondary | ICD-10-CM | POA: Diagnosis not present

## 2018-06-19 DIAGNOSIS — Z7901 Long term (current) use of anticoagulants: Secondary | ICD-10-CM | POA: Diagnosis not present

## 2018-06-19 DIAGNOSIS — M1612 Unilateral primary osteoarthritis, left hip: Secondary | ICD-10-CM | POA: Diagnosis not present

## 2018-06-22 DIAGNOSIS — L89623 Pressure ulcer of left heel, stage 3: Secondary | ICD-10-CM | POA: Diagnosis not present

## 2018-06-22 DIAGNOSIS — Z5181 Encounter for therapeutic drug level monitoring: Secondary | ICD-10-CM | POA: Diagnosis not present

## 2018-06-22 DIAGNOSIS — E1122 Type 2 diabetes mellitus with diabetic chronic kidney disease: Secondary | ICD-10-CM | POA: Diagnosis not present

## 2018-06-22 DIAGNOSIS — I130 Hypertensive heart and chronic kidney disease with heart failure and stage 1 through stage 4 chronic kidney disease, or unspecified chronic kidney disease: Secondary | ICD-10-CM | POA: Diagnosis not present

## 2018-06-22 DIAGNOSIS — I4891 Unspecified atrial fibrillation: Secondary | ICD-10-CM | POA: Diagnosis not present

## 2018-06-22 DIAGNOSIS — Z4800 Encounter for change or removal of nonsurgical wound dressing: Secondary | ICD-10-CM | POA: Diagnosis not present

## 2018-06-22 DIAGNOSIS — M62838 Other muscle spasm: Secondary | ICD-10-CM | POA: Diagnosis not present

## 2018-06-22 DIAGNOSIS — E1142 Type 2 diabetes mellitus with diabetic polyneuropathy: Secondary | ICD-10-CM | POA: Diagnosis not present

## 2018-06-22 DIAGNOSIS — Z7901 Long term (current) use of anticoagulants: Secondary | ICD-10-CM | POA: Diagnosis not present

## 2018-06-22 DIAGNOSIS — I5042 Chronic combined systolic (congestive) and diastolic (congestive) heart failure: Secondary | ICD-10-CM | POA: Diagnosis not present

## 2018-06-22 DIAGNOSIS — E1165 Type 2 diabetes mellitus with hyperglycemia: Secondary | ICD-10-CM | POA: Diagnosis not present

## 2018-06-22 DIAGNOSIS — M1612 Unilateral primary osteoarthritis, left hip: Secondary | ICD-10-CM | POA: Diagnosis not present

## 2018-06-22 DIAGNOSIS — Z87891 Personal history of nicotine dependence: Secondary | ICD-10-CM | POA: Diagnosis not present

## 2018-06-22 DIAGNOSIS — N183 Chronic kidney disease, stage 3 (moderate): Secondary | ICD-10-CM | POA: Diagnosis not present

## 2018-06-23 DIAGNOSIS — Z7901 Long term (current) use of anticoagulants: Secondary | ICD-10-CM | POA: Diagnosis not present

## 2018-06-23 DIAGNOSIS — Z87891 Personal history of nicotine dependence: Secondary | ICD-10-CM | POA: Diagnosis not present

## 2018-06-23 DIAGNOSIS — I5042 Chronic combined systolic (congestive) and diastolic (congestive) heart failure: Secondary | ICD-10-CM | POA: Diagnosis not present

## 2018-06-23 DIAGNOSIS — M1612 Unilateral primary osteoarthritis, left hip: Secondary | ICD-10-CM | POA: Diagnosis not present

## 2018-06-23 DIAGNOSIS — Z4800 Encounter for change or removal of nonsurgical wound dressing: Secondary | ICD-10-CM | POA: Diagnosis not present

## 2018-06-23 DIAGNOSIS — Z5181 Encounter for therapeutic drug level monitoring: Secondary | ICD-10-CM | POA: Diagnosis not present

## 2018-06-23 DIAGNOSIS — L89611 Pressure ulcer of right heel, stage 1: Secondary | ICD-10-CM | POA: Diagnosis not present

## 2018-06-23 DIAGNOSIS — I130 Hypertensive heart and chronic kidney disease with heart failure and stage 1 through stage 4 chronic kidney disease, or unspecified chronic kidney disease: Secondary | ICD-10-CM | POA: Diagnosis not present

## 2018-06-23 DIAGNOSIS — M62838 Other muscle spasm: Secondary | ICD-10-CM | POA: Diagnosis not present

## 2018-06-23 DIAGNOSIS — I4891 Unspecified atrial fibrillation: Secondary | ICD-10-CM | POA: Diagnosis not present

## 2018-06-23 DIAGNOSIS — E1122 Type 2 diabetes mellitus with diabetic chronic kidney disease: Secondary | ICD-10-CM | POA: Diagnosis not present

## 2018-06-23 DIAGNOSIS — E1165 Type 2 diabetes mellitus with hyperglycemia: Secondary | ICD-10-CM | POA: Diagnosis not present

## 2018-06-23 DIAGNOSIS — N183 Chronic kidney disease, stage 3 (moderate): Secondary | ICD-10-CM | POA: Diagnosis not present

## 2018-06-23 DIAGNOSIS — L89623 Pressure ulcer of left heel, stage 3: Secondary | ICD-10-CM | POA: Diagnosis not present

## 2018-06-23 DIAGNOSIS — E1142 Type 2 diabetes mellitus with diabetic polyneuropathy: Secondary | ICD-10-CM | POA: Diagnosis not present

## 2018-06-23 DIAGNOSIS — L89891 Pressure ulcer of other site, stage 1: Secondary | ICD-10-CM | POA: Diagnosis not present

## 2018-06-24 DIAGNOSIS — M5442 Lumbago with sciatica, left side: Secondary | ICD-10-CM | POA: Diagnosis not present

## 2018-06-24 DIAGNOSIS — Z79899 Other long term (current) drug therapy: Secondary | ICD-10-CM | POA: Diagnosis not present

## 2018-06-24 DIAGNOSIS — R300 Dysuria: Secondary | ICD-10-CM | POA: Diagnosis not present

## 2018-06-24 DIAGNOSIS — G8929 Other chronic pain: Secondary | ICD-10-CM | POA: Diagnosis not present

## 2018-06-24 DIAGNOSIS — N390 Urinary tract infection, site not specified: Secondary | ICD-10-CM | POA: Diagnosis not present

## 2018-06-26 DIAGNOSIS — Z5181 Encounter for therapeutic drug level monitoring: Secondary | ICD-10-CM | POA: Diagnosis not present

## 2018-06-26 DIAGNOSIS — I4891 Unspecified atrial fibrillation: Secondary | ICD-10-CM | POA: Diagnosis not present

## 2018-06-26 DIAGNOSIS — M62838 Other muscle spasm: Secondary | ICD-10-CM | POA: Diagnosis not present

## 2018-06-26 DIAGNOSIS — M1612 Unilateral primary osteoarthritis, left hip: Secondary | ICD-10-CM | POA: Diagnosis not present

## 2018-06-26 DIAGNOSIS — L89623 Pressure ulcer of left heel, stage 3: Secondary | ICD-10-CM | POA: Diagnosis not present

## 2018-06-26 DIAGNOSIS — I5042 Chronic combined systolic (congestive) and diastolic (congestive) heart failure: Secondary | ICD-10-CM | POA: Diagnosis not present

## 2018-06-26 DIAGNOSIS — I130 Hypertensive heart and chronic kidney disease with heart failure and stage 1 through stage 4 chronic kidney disease, or unspecified chronic kidney disease: Secondary | ICD-10-CM | POA: Diagnosis not present

## 2018-06-26 DIAGNOSIS — N183 Chronic kidney disease, stage 3 (moderate): Secondary | ICD-10-CM | POA: Diagnosis not present

## 2018-06-26 DIAGNOSIS — Z87891 Personal history of nicotine dependence: Secondary | ICD-10-CM | POA: Diagnosis not present

## 2018-06-26 DIAGNOSIS — E1142 Type 2 diabetes mellitus with diabetic polyneuropathy: Secondary | ICD-10-CM | POA: Diagnosis not present

## 2018-06-26 DIAGNOSIS — Z7901 Long term (current) use of anticoagulants: Secondary | ICD-10-CM | POA: Diagnosis not present

## 2018-06-26 DIAGNOSIS — E1122 Type 2 diabetes mellitus with diabetic chronic kidney disease: Secondary | ICD-10-CM | POA: Diagnosis not present

## 2018-06-26 DIAGNOSIS — E1165 Type 2 diabetes mellitus with hyperglycemia: Secondary | ICD-10-CM | POA: Diagnosis not present

## 2018-06-26 DIAGNOSIS — Z4800 Encounter for change or removal of nonsurgical wound dressing: Secondary | ICD-10-CM | POA: Diagnosis not present

## 2018-06-29 DIAGNOSIS — N183 Chronic kidney disease, stage 3 (moderate): Secondary | ICD-10-CM | POA: Diagnosis not present

## 2018-06-29 DIAGNOSIS — Z87891 Personal history of nicotine dependence: Secondary | ICD-10-CM | POA: Diagnosis not present

## 2018-06-29 DIAGNOSIS — Z5181 Encounter for therapeutic drug level monitoring: Secondary | ICD-10-CM | POA: Diagnosis not present

## 2018-06-29 DIAGNOSIS — E1142 Type 2 diabetes mellitus with diabetic polyneuropathy: Secondary | ICD-10-CM | POA: Diagnosis not present

## 2018-06-29 DIAGNOSIS — Z7901 Long term (current) use of anticoagulants: Secondary | ICD-10-CM | POA: Diagnosis not present

## 2018-06-29 DIAGNOSIS — E1122 Type 2 diabetes mellitus with diabetic chronic kidney disease: Secondary | ICD-10-CM | POA: Diagnosis not present

## 2018-06-29 DIAGNOSIS — E1165 Type 2 diabetes mellitus with hyperglycemia: Secondary | ICD-10-CM | POA: Diagnosis not present

## 2018-06-29 DIAGNOSIS — G8929 Other chronic pain: Secondary | ICD-10-CM | POA: Diagnosis not present

## 2018-06-29 DIAGNOSIS — Z23 Encounter for immunization: Secondary | ICD-10-CM | POA: Diagnosis not present

## 2018-06-29 DIAGNOSIS — M62838 Other muscle spasm: Secondary | ICD-10-CM | POA: Diagnosis not present

## 2018-06-29 DIAGNOSIS — I4891 Unspecified atrial fibrillation: Secondary | ICD-10-CM | POA: Diagnosis not present

## 2018-06-29 DIAGNOSIS — I10 Essential (primary) hypertension: Secondary | ICD-10-CM | POA: Diagnosis not present

## 2018-06-29 DIAGNOSIS — L89623 Pressure ulcer of left heel, stage 3: Secondary | ICD-10-CM | POA: Diagnosis not present

## 2018-06-29 DIAGNOSIS — I4892 Unspecified atrial flutter: Secondary | ICD-10-CM | POA: Diagnosis not present

## 2018-06-29 DIAGNOSIS — R300 Dysuria: Secondary | ICD-10-CM | POA: Diagnosis not present

## 2018-06-29 DIAGNOSIS — R6889 Other general symptoms and signs: Secondary | ICD-10-CM | POA: Diagnosis not present

## 2018-06-29 DIAGNOSIS — I130 Hypertensive heart and chronic kidney disease with heart failure and stage 1 through stage 4 chronic kidney disease, or unspecified chronic kidney disease: Secondary | ICD-10-CM | POA: Diagnosis not present

## 2018-06-29 DIAGNOSIS — I5042 Chronic combined systolic (congestive) and diastolic (congestive) heart failure: Secondary | ICD-10-CM | POA: Diagnosis not present

## 2018-06-29 DIAGNOSIS — M1612 Unilateral primary osteoarthritis, left hip: Secondary | ICD-10-CM | POA: Diagnosis not present

## 2018-06-29 DIAGNOSIS — Z4800 Encounter for change or removal of nonsurgical wound dressing: Secondary | ICD-10-CM | POA: Diagnosis not present

## 2018-06-29 DIAGNOSIS — E119 Type 2 diabetes mellitus without complications: Secondary | ICD-10-CM | POA: Diagnosis not present

## 2018-06-29 DIAGNOSIS — G4709 Other insomnia: Secondary | ICD-10-CM | POA: Diagnosis not present

## 2018-06-30 DIAGNOSIS — M1612 Unilateral primary osteoarthritis, left hip: Secondary | ICD-10-CM | POA: Diagnosis not present

## 2018-06-30 DIAGNOSIS — Z7901 Long term (current) use of anticoagulants: Secondary | ICD-10-CM | POA: Diagnosis not present

## 2018-06-30 DIAGNOSIS — L89623 Pressure ulcer of left heel, stage 3: Secondary | ICD-10-CM | POA: Diagnosis not present

## 2018-06-30 DIAGNOSIS — N183 Chronic kidney disease, stage 3 (moderate): Secondary | ICD-10-CM | POA: Diagnosis not present

## 2018-06-30 DIAGNOSIS — M62838 Other muscle spasm: Secondary | ICD-10-CM | POA: Diagnosis not present

## 2018-06-30 DIAGNOSIS — I4891 Unspecified atrial fibrillation: Secondary | ICD-10-CM | POA: Diagnosis not present

## 2018-06-30 DIAGNOSIS — I130 Hypertensive heart and chronic kidney disease with heart failure and stage 1 through stage 4 chronic kidney disease, or unspecified chronic kidney disease: Secondary | ICD-10-CM | POA: Diagnosis not present

## 2018-06-30 DIAGNOSIS — Z5181 Encounter for therapeutic drug level monitoring: Secondary | ICD-10-CM | POA: Diagnosis not present

## 2018-06-30 DIAGNOSIS — Z87891 Personal history of nicotine dependence: Secondary | ICD-10-CM | POA: Diagnosis not present

## 2018-06-30 DIAGNOSIS — E1142 Type 2 diabetes mellitus with diabetic polyneuropathy: Secondary | ICD-10-CM | POA: Diagnosis not present

## 2018-06-30 DIAGNOSIS — Z4800 Encounter for change or removal of nonsurgical wound dressing: Secondary | ICD-10-CM | POA: Diagnosis not present

## 2018-06-30 DIAGNOSIS — E1122 Type 2 diabetes mellitus with diabetic chronic kidney disease: Secondary | ICD-10-CM | POA: Diagnosis not present

## 2018-06-30 DIAGNOSIS — I5042 Chronic combined systolic (congestive) and diastolic (congestive) heart failure: Secondary | ICD-10-CM | POA: Diagnosis not present

## 2018-06-30 DIAGNOSIS — E1165 Type 2 diabetes mellitus with hyperglycemia: Secondary | ICD-10-CM | POA: Diagnosis not present

## 2018-07-03 DIAGNOSIS — E1142 Type 2 diabetes mellitus with diabetic polyneuropathy: Secondary | ICD-10-CM | POA: Diagnosis not present

## 2018-07-03 DIAGNOSIS — Z7901 Long term (current) use of anticoagulants: Secondary | ICD-10-CM | POA: Diagnosis not present

## 2018-07-03 DIAGNOSIS — E1165 Type 2 diabetes mellitus with hyperglycemia: Secondary | ICD-10-CM | POA: Diagnosis not present

## 2018-07-03 DIAGNOSIS — I4891 Unspecified atrial fibrillation: Secondary | ICD-10-CM | POA: Diagnosis not present

## 2018-07-03 DIAGNOSIS — Z4800 Encounter for change or removal of nonsurgical wound dressing: Secondary | ICD-10-CM | POA: Diagnosis not present

## 2018-07-03 DIAGNOSIS — Z5181 Encounter for therapeutic drug level monitoring: Secondary | ICD-10-CM | POA: Diagnosis not present

## 2018-07-03 DIAGNOSIS — I130 Hypertensive heart and chronic kidney disease with heart failure and stage 1 through stage 4 chronic kidney disease, or unspecified chronic kidney disease: Secondary | ICD-10-CM | POA: Diagnosis not present

## 2018-07-03 DIAGNOSIS — M1612 Unilateral primary osteoarthritis, left hip: Secondary | ICD-10-CM | POA: Diagnosis not present

## 2018-07-03 DIAGNOSIS — Z87891 Personal history of nicotine dependence: Secondary | ICD-10-CM | POA: Diagnosis not present

## 2018-07-03 DIAGNOSIS — M62838 Other muscle spasm: Secondary | ICD-10-CM | POA: Diagnosis not present

## 2018-07-03 DIAGNOSIS — L89623 Pressure ulcer of left heel, stage 3: Secondary | ICD-10-CM | POA: Diagnosis not present

## 2018-07-03 DIAGNOSIS — N183 Chronic kidney disease, stage 3 (moderate): Secondary | ICD-10-CM | POA: Diagnosis not present

## 2018-07-03 DIAGNOSIS — E1122 Type 2 diabetes mellitus with diabetic chronic kidney disease: Secondary | ICD-10-CM | POA: Diagnosis not present

## 2018-07-03 DIAGNOSIS — I5042 Chronic combined systolic (congestive) and diastolic (congestive) heart failure: Secondary | ICD-10-CM | POA: Diagnosis not present

## 2018-07-07 DIAGNOSIS — N183 Chronic kidney disease, stage 3 (moderate): Secondary | ICD-10-CM | POA: Diagnosis not present

## 2018-07-07 DIAGNOSIS — Z5181 Encounter for therapeutic drug level monitoring: Secondary | ICD-10-CM | POA: Diagnosis not present

## 2018-07-07 DIAGNOSIS — Z87891 Personal history of nicotine dependence: Secondary | ICD-10-CM | POA: Diagnosis not present

## 2018-07-07 DIAGNOSIS — E1142 Type 2 diabetes mellitus with diabetic polyneuropathy: Secondary | ICD-10-CM | POA: Diagnosis not present

## 2018-07-07 DIAGNOSIS — I130 Hypertensive heart and chronic kidney disease with heart failure and stage 1 through stage 4 chronic kidney disease, or unspecified chronic kidney disease: Secondary | ICD-10-CM | POA: Diagnosis not present

## 2018-07-07 DIAGNOSIS — M1612 Unilateral primary osteoarthritis, left hip: Secondary | ICD-10-CM | POA: Diagnosis not present

## 2018-07-07 DIAGNOSIS — Z7901 Long term (current) use of anticoagulants: Secondary | ICD-10-CM | POA: Diagnosis not present

## 2018-07-07 DIAGNOSIS — I4891 Unspecified atrial fibrillation: Secondary | ICD-10-CM | POA: Diagnosis not present

## 2018-07-07 DIAGNOSIS — E1165 Type 2 diabetes mellitus with hyperglycemia: Secondary | ICD-10-CM | POA: Diagnosis not present

## 2018-07-07 DIAGNOSIS — I5042 Chronic combined systolic (congestive) and diastolic (congestive) heart failure: Secondary | ICD-10-CM | POA: Diagnosis not present

## 2018-07-07 DIAGNOSIS — E1122 Type 2 diabetes mellitus with diabetic chronic kidney disease: Secondary | ICD-10-CM | POA: Diagnosis not present

## 2018-07-07 DIAGNOSIS — M62838 Other muscle spasm: Secondary | ICD-10-CM | POA: Diagnosis not present

## 2018-07-10 DIAGNOSIS — E1165 Type 2 diabetes mellitus with hyperglycemia: Secondary | ICD-10-CM | POA: Diagnosis not present

## 2018-07-10 DIAGNOSIS — E1142 Type 2 diabetes mellitus with diabetic polyneuropathy: Secondary | ICD-10-CM | POA: Diagnosis not present

## 2018-07-10 DIAGNOSIS — M62838 Other muscle spasm: Secondary | ICD-10-CM | POA: Diagnosis not present

## 2018-07-10 DIAGNOSIS — M1612 Unilateral primary osteoarthritis, left hip: Secondary | ICD-10-CM | POA: Diagnosis not present

## 2018-07-10 DIAGNOSIS — Z7901 Long term (current) use of anticoagulants: Secondary | ICD-10-CM | POA: Diagnosis not present

## 2018-07-10 DIAGNOSIS — I4891 Unspecified atrial fibrillation: Secondary | ICD-10-CM | POA: Diagnosis not present

## 2018-07-10 DIAGNOSIS — I5042 Chronic combined systolic (congestive) and diastolic (congestive) heart failure: Secondary | ICD-10-CM | POA: Diagnosis not present

## 2018-07-10 DIAGNOSIS — Z5181 Encounter for therapeutic drug level monitoring: Secondary | ICD-10-CM | POA: Diagnosis not present

## 2018-07-10 DIAGNOSIS — E1122 Type 2 diabetes mellitus with diabetic chronic kidney disease: Secondary | ICD-10-CM | POA: Diagnosis not present

## 2018-07-10 DIAGNOSIS — Z87891 Personal history of nicotine dependence: Secondary | ICD-10-CM | POA: Diagnosis not present

## 2018-07-10 DIAGNOSIS — I130 Hypertensive heart and chronic kidney disease with heart failure and stage 1 through stage 4 chronic kidney disease, or unspecified chronic kidney disease: Secondary | ICD-10-CM | POA: Diagnosis not present

## 2018-07-10 DIAGNOSIS — N183 Chronic kidney disease, stage 3 (moderate): Secondary | ICD-10-CM | POA: Diagnosis not present

## 2018-07-13 DIAGNOSIS — L89891 Pressure ulcer of other site, stage 1: Secondary | ICD-10-CM | POA: Diagnosis not present

## 2018-07-13 DIAGNOSIS — L89623 Pressure ulcer of left heel, stage 3: Secondary | ICD-10-CM | POA: Diagnosis not present

## 2018-07-14 DIAGNOSIS — E1165 Type 2 diabetes mellitus with hyperglycemia: Secondary | ICD-10-CM | POA: Diagnosis not present

## 2018-07-14 DIAGNOSIS — N183 Chronic kidney disease, stage 3 (moderate): Secondary | ICD-10-CM | POA: Diagnosis not present

## 2018-07-14 DIAGNOSIS — I130 Hypertensive heart and chronic kidney disease with heart failure and stage 1 through stage 4 chronic kidney disease, or unspecified chronic kidney disease: Secondary | ICD-10-CM | POA: Diagnosis not present

## 2018-07-14 DIAGNOSIS — Z5181 Encounter for therapeutic drug level monitoring: Secondary | ICD-10-CM | POA: Diagnosis not present

## 2018-07-14 DIAGNOSIS — I5042 Chronic combined systolic (congestive) and diastolic (congestive) heart failure: Secondary | ICD-10-CM | POA: Diagnosis not present

## 2018-07-14 DIAGNOSIS — E1122 Type 2 diabetes mellitus with diabetic chronic kidney disease: Secondary | ICD-10-CM | POA: Diagnosis not present

## 2018-07-14 DIAGNOSIS — Z87891 Personal history of nicotine dependence: Secondary | ICD-10-CM | POA: Diagnosis not present

## 2018-07-14 DIAGNOSIS — Z7901 Long term (current) use of anticoagulants: Secondary | ICD-10-CM | POA: Diagnosis not present

## 2018-07-14 DIAGNOSIS — I4891 Unspecified atrial fibrillation: Secondary | ICD-10-CM | POA: Diagnosis not present

## 2018-07-14 DIAGNOSIS — M62838 Other muscle spasm: Secondary | ICD-10-CM | POA: Diagnosis not present

## 2018-07-14 DIAGNOSIS — M1612 Unilateral primary osteoarthritis, left hip: Secondary | ICD-10-CM | POA: Diagnosis not present

## 2018-07-14 DIAGNOSIS — E1142 Type 2 diabetes mellitus with diabetic polyneuropathy: Secondary | ICD-10-CM | POA: Diagnosis not present

## 2018-07-16 DIAGNOSIS — N183 Chronic kidney disease, stage 3 (moderate): Secondary | ICD-10-CM | POA: Diagnosis not present

## 2018-07-16 DIAGNOSIS — E1165 Type 2 diabetes mellitus with hyperglycemia: Secondary | ICD-10-CM | POA: Diagnosis not present

## 2018-07-16 DIAGNOSIS — E1142 Type 2 diabetes mellitus with diabetic polyneuropathy: Secondary | ICD-10-CM | POA: Diagnosis not present

## 2018-07-16 DIAGNOSIS — Z87891 Personal history of nicotine dependence: Secondary | ICD-10-CM | POA: Diagnosis not present

## 2018-07-16 DIAGNOSIS — Z7901 Long term (current) use of anticoagulants: Secondary | ICD-10-CM | POA: Diagnosis not present

## 2018-07-16 DIAGNOSIS — Z5181 Encounter for therapeutic drug level monitoring: Secondary | ICD-10-CM | POA: Diagnosis not present

## 2018-07-16 DIAGNOSIS — I5042 Chronic combined systolic (congestive) and diastolic (congestive) heart failure: Secondary | ICD-10-CM | POA: Diagnosis not present

## 2018-07-16 DIAGNOSIS — M62838 Other muscle spasm: Secondary | ICD-10-CM | POA: Diagnosis not present

## 2018-07-16 DIAGNOSIS — I130 Hypertensive heart and chronic kidney disease with heart failure and stage 1 through stage 4 chronic kidney disease, or unspecified chronic kidney disease: Secondary | ICD-10-CM | POA: Diagnosis not present

## 2018-07-16 DIAGNOSIS — M1612 Unilateral primary osteoarthritis, left hip: Secondary | ICD-10-CM | POA: Diagnosis not present

## 2018-07-16 DIAGNOSIS — E1122 Type 2 diabetes mellitus with diabetic chronic kidney disease: Secondary | ICD-10-CM | POA: Diagnosis not present

## 2018-07-16 DIAGNOSIS — I4891 Unspecified atrial fibrillation: Secondary | ICD-10-CM | POA: Diagnosis not present

## 2018-07-17 DIAGNOSIS — E1142 Type 2 diabetes mellitus with diabetic polyneuropathy: Secondary | ICD-10-CM | POA: Diagnosis not present

## 2018-07-17 DIAGNOSIS — E1165 Type 2 diabetes mellitus with hyperglycemia: Secondary | ICD-10-CM | POA: Diagnosis not present

## 2018-07-17 DIAGNOSIS — E1122 Type 2 diabetes mellitus with diabetic chronic kidney disease: Secondary | ICD-10-CM | POA: Diagnosis not present

## 2018-07-17 DIAGNOSIS — M1612 Unilateral primary osteoarthritis, left hip: Secondary | ICD-10-CM | POA: Diagnosis not present

## 2018-07-17 DIAGNOSIS — Z87891 Personal history of nicotine dependence: Secondary | ICD-10-CM | POA: Diagnosis not present

## 2018-07-17 DIAGNOSIS — I5042 Chronic combined systolic (congestive) and diastolic (congestive) heart failure: Secondary | ICD-10-CM | POA: Diagnosis not present

## 2018-07-17 DIAGNOSIS — I130 Hypertensive heart and chronic kidney disease with heart failure and stage 1 through stage 4 chronic kidney disease, or unspecified chronic kidney disease: Secondary | ICD-10-CM | POA: Diagnosis not present

## 2018-07-17 DIAGNOSIS — M62838 Other muscle spasm: Secondary | ICD-10-CM | POA: Diagnosis not present

## 2018-07-17 DIAGNOSIS — I4891 Unspecified atrial fibrillation: Secondary | ICD-10-CM | POA: Diagnosis not present

## 2018-07-17 DIAGNOSIS — Z5181 Encounter for therapeutic drug level monitoring: Secondary | ICD-10-CM | POA: Diagnosis not present

## 2018-07-17 DIAGNOSIS — N183 Chronic kidney disease, stage 3 (moderate): Secondary | ICD-10-CM | POA: Diagnosis not present

## 2018-07-17 DIAGNOSIS — Z7901 Long term (current) use of anticoagulants: Secondary | ICD-10-CM | POA: Diagnosis not present

## 2018-07-20 DIAGNOSIS — Z5181 Encounter for therapeutic drug level monitoring: Secondary | ICD-10-CM | POA: Diagnosis not present

## 2018-07-20 DIAGNOSIS — M1612 Unilateral primary osteoarthritis, left hip: Secondary | ICD-10-CM | POA: Diagnosis not present

## 2018-07-20 DIAGNOSIS — I4891 Unspecified atrial fibrillation: Secondary | ICD-10-CM | POA: Diagnosis not present

## 2018-07-20 DIAGNOSIS — E1122 Type 2 diabetes mellitus with diabetic chronic kidney disease: Secondary | ICD-10-CM | POA: Diagnosis not present

## 2018-07-20 DIAGNOSIS — E1165 Type 2 diabetes mellitus with hyperglycemia: Secondary | ICD-10-CM | POA: Diagnosis not present

## 2018-07-20 DIAGNOSIS — Z87891 Personal history of nicotine dependence: Secondary | ICD-10-CM | POA: Diagnosis not present

## 2018-07-20 DIAGNOSIS — Z7901 Long term (current) use of anticoagulants: Secondary | ICD-10-CM | POA: Diagnosis not present

## 2018-07-20 DIAGNOSIS — I130 Hypertensive heart and chronic kidney disease with heart failure and stage 1 through stage 4 chronic kidney disease, or unspecified chronic kidney disease: Secondary | ICD-10-CM | POA: Diagnosis not present

## 2018-07-20 DIAGNOSIS — E1142 Type 2 diabetes mellitus with diabetic polyneuropathy: Secondary | ICD-10-CM | POA: Diagnosis not present

## 2018-07-20 DIAGNOSIS — N183 Chronic kidney disease, stage 3 (moderate): Secondary | ICD-10-CM | POA: Diagnosis not present

## 2018-07-20 DIAGNOSIS — I5042 Chronic combined systolic (congestive) and diastolic (congestive) heart failure: Secondary | ICD-10-CM | POA: Diagnosis not present

## 2018-07-20 DIAGNOSIS — M62838 Other muscle spasm: Secondary | ICD-10-CM | POA: Diagnosis not present

## 2018-07-21 DIAGNOSIS — Z5181 Encounter for therapeutic drug level monitoring: Secondary | ICD-10-CM | POA: Diagnosis not present

## 2018-07-21 DIAGNOSIS — E1122 Type 2 diabetes mellitus with diabetic chronic kidney disease: Secondary | ICD-10-CM | POA: Diagnosis not present

## 2018-07-21 DIAGNOSIS — I130 Hypertensive heart and chronic kidney disease with heart failure and stage 1 through stage 4 chronic kidney disease, or unspecified chronic kidney disease: Secondary | ICD-10-CM | POA: Diagnosis not present

## 2018-07-21 DIAGNOSIS — M62838 Other muscle spasm: Secondary | ICD-10-CM | POA: Diagnosis not present

## 2018-07-21 DIAGNOSIS — Z7901 Long term (current) use of anticoagulants: Secondary | ICD-10-CM | POA: Diagnosis not present

## 2018-07-21 DIAGNOSIS — N183 Chronic kidney disease, stage 3 (moderate): Secondary | ICD-10-CM | POA: Diagnosis not present

## 2018-07-21 DIAGNOSIS — Z87891 Personal history of nicotine dependence: Secondary | ICD-10-CM | POA: Diagnosis not present

## 2018-07-21 DIAGNOSIS — M1612 Unilateral primary osteoarthritis, left hip: Secondary | ICD-10-CM | POA: Diagnosis not present

## 2018-07-21 DIAGNOSIS — I5042 Chronic combined systolic (congestive) and diastolic (congestive) heart failure: Secondary | ICD-10-CM | POA: Diagnosis not present

## 2018-07-21 DIAGNOSIS — E1165 Type 2 diabetes mellitus with hyperglycemia: Secondary | ICD-10-CM | POA: Diagnosis not present

## 2018-07-21 DIAGNOSIS — I4891 Unspecified atrial fibrillation: Secondary | ICD-10-CM | POA: Diagnosis not present

## 2018-07-21 DIAGNOSIS — E1142 Type 2 diabetes mellitus with diabetic polyneuropathy: Secondary | ICD-10-CM | POA: Diagnosis not present

## 2018-07-22 DIAGNOSIS — I4891 Unspecified atrial fibrillation: Secondary | ICD-10-CM | POA: Diagnosis not present

## 2018-07-22 DIAGNOSIS — Z5181 Encounter for therapeutic drug level monitoring: Secondary | ICD-10-CM | POA: Diagnosis not present

## 2018-07-22 DIAGNOSIS — M1612 Unilateral primary osteoarthritis, left hip: Secondary | ICD-10-CM | POA: Diagnosis not present

## 2018-07-22 DIAGNOSIS — Z7901 Long term (current) use of anticoagulants: Secondary | ICD-10-CM | POA: Diagnosis not present

## 2018-07-22 DIAGNOSIS — E1142 Type 2 diabetes mellitus with diabetic polyneuropathy: Secondary | ICD-10-CM | POA: Diagnosis not present

## 2018-07-22 DIAGNOSIS — Z87891 Personal history of nicotine dependence: Secondary | ICD-10-CM | POA: Diagnosis not present

## 2018-07-22 DIAGNOSIS — I130 Hypertensive heart and chronic kidney disease with heart failure and stage 1 through stage 4 chronic kidney disease, or unspecified chronic kidney disease: Secondary | ICD-10-CM | POA: Diagnosis not present

## 2018-07-22 DIAGNOSIS — M62838 Other muscle spasm: Secondary | ICD-10-CM | POA: Diagnosis not present

## 2018-07-22 DIAGNOSIS — N183 Chronic kidney disease, stage 3 (moderate): Secondary | ICD-10-CM | POA: Diagnosis not present

## 2018-07-22 DIAGNOSIS — E1165 Type 2 diabetes mellitus with hyperglycemia: Secondary | ICD-10-CM | POA: Diagnosis not present

## 2018-07-22 DIAGNOSIS — I5042 Chronic combined systolic (congestive) and diastolic (congestive) heart failure: Secondary | ICD-10-CM | POA: Diagnosis not present

## 2018-07-22 DIAGNOSIS — E1122 Type 2 diabetes mellitus with diabetic chronic kidney disease: Secondary | ICD-10-CM | POA: Diagnosis not present

## 2018-07-24 DIAGNOSIS — E1122 Type 2 diabetes mellitus with diabetic chronic kidney disease: Secondary | ICD-10-CM | POA: Diagnosis not present

## 2018-07-24 DIAGNOSIS — E1142 Type 2 diabetes mellitus with diabetic polyneuropathy: Secondary | ICD-10-CM | POA: Diagnosis not present

## 2018-07-24 DIAGNOSIS — I5042 Chronic combined systolic (congestive) and diastolic (congestive) heart failure: Secondary | ICD-10-CM | POA: Diagnosis not present

## 2018-07-24 DIAGNOSIS — I130 Hypertensive heart and chronic kidney disease with heart failure and stage 1 through stage 4 chronic kidney disease, or unspecified chronic kidney disease: Secondary | ICD-10-CM | POA: Diagnosis not present

## 2018-07-24 DIAGNOSIS — M62838 Other muscle spasm: Secondary | ICD-10-CM | POA: Diagnosis not present

## 2018-07-24 DIAGNOSIS — N183 Chronic kidney disease, stage 3 (moderate): Secondary | ICD-10-CM | POA: Diagnosis not present

## 2018-07-24 DIAGNOSIS — Z5181 Encounter for therapeutic drug level monitoring: Secondary | ICD-10-CM | POA: Diagnosis not present

## 2018-07-24 DIAGNOSIS — I4891 Unspecified atrial fibrillation: Secondary | ICD-10-CM | POA: Diagnosis not present

## 2018-07-24 DIAGNOSIS — Z87891 Personal history of nicotine dependence: Secondary | ICD-10-CM | POA: Diagnosis not present

## 2018-07-24 DIAGNOSIS — M1612 Unilateral primary osteoarthritis, left hip: Secondary | ICD-10-CM | POA: Diagnosis not present

## 2018-07-24 DIAGNOSIS — Z7901 Long term (current) use of anticoagulants: Secondary | ICD-10-CM | POA: Diagnosis not present

## 2018-07-24 DIAGNOSIS — E1165 Type 2 diabetes mellitus with hyperglycemia: Secondary | ICD-10-CM | POA: Diagnosis not present

## 2018-07-27 DIAGNOSIS — M1612 Unilateral primary osteoarthritis, left hip: Secondary | ICD-10-CM | POA: Diagnosis not present

## 2018-07-27 DIAGNOSIS — E1165 Type 2 diabetes mellitus with hyperglycemia: Secondary | ICD-10-CM | POA: Diagnosis not present

## 2018-07-27 DIAGNOSIS — M62838 Other muscle spasm: Secondary | ICD-10-CM | POA: Diagnosis not present

## 2018-07-27 DIAGNOSIS — Z87891 Personal history of nicotine dependence: Secondary | ICD-10-CM | POA: Diagnosis not present

## 2018-07-27 DIAGNOSIS — Z7901 Long term (current) use of anticoagulants: Secondary | ICD-10-CM | POA: Diagnosis not present

## 2018-07-27 DIAGNOSIS — I5042 Chronic combined systolic (congestive) and diastolic (congestive) heart failure: Secondary | ICD-10-CM | POA: Diagnosis not present

## 2018-07-27 DIAGNOSIS — I4891 Unspecified atrial fibrillation: Secondary | ICD-10-CM | POA: Diagnosis not present

## 2018-07-27 DIAGNOSIS — N183 Chronic kidney disease, stage 3 (moderate): Secondary | ICD-10-CM | POA: Diagnosis not present

## 2018-07-27 DIAGNOSIS — E1142 Type 2 diabetes mellitus with diabetic polyneuropathy: Secondary | ICD-10-CM | POA: Diagnosis not present

## 2018-07-27 DIAGNOSIS — E1122 Type 2 diabetes mellitus with diabetic chronic kidney disease: Secondary | ICD-10-CM | POA: Diagnosis not present

## 2018-07-27 DIAGNOSIS — Z5181 Encounter for therapeutic drug level monitoring: Secondary | ICD-10-CM | POA: Diagnosis not present

## 2018-07-27 DIAGNOSIS — I130 Hypertensive heart and chronic kidney disease with heart failure and stage 1 through stage 4 chronic kidney disease, or unspecified chronic kidney disease: Secondary | ICD-10-CM | POA: Diagnosis not present

## 2018-07-28 DIAGNOSIS — N183 Chronic kidney disease, stage 3 (moderate): Secondary | ICD-10-CM | POA: Diagnosis not present

## 2018-07-28 DIAGNOSIS — E1142 Type 2 diabetes mellitus with diabetic polyneuropathy: Secondary | ICD-10-CM | POA: Diagnosis not present

## 2018-07-28 DIAGNOSIS — Z5181 Encounter for therapeutic drug level monitoring: Secondary | ICD-10-CM | POA: Diagnosis not present

## 2018-07-28 DIAGNOSIS — M1612 Unilateral primary osteoarthritis, left hip: Secondary | ICD-10-CM | POA: Diagnosis not present

## 2018-07-28 DIAGNOSIS — Z87891 Personal history of nicotine dependence: Secondary | ICD-10-CM | POA: Diagnosis not present

## 2018-07-28 DIAGNOSIS — E1122 Type 2 diabetes mellitus with diabetic chronic kidney disease: Secondary | ICD-10-CM | POA: Diagnosis not present

## 2018-07-28 DIAGNOSIS — I4891 Unspecified atrial fibrillation: Secondary | ICD-10-CM | POA: Diagnosis not present

## 2018-07-28 DIAGNOSIS — Z7901 Long term (current) use of anticoagulants: Secondary | ICD-10-CM | POA: Diagnosis not present

## 2018-07-28 DIAGNOSIS — E1165 Type 2 diabetes mellitus with hyperglycemia: Secondary | ICD-10-CM | POA: Diagnosis not present

## 2018-07-28 DIAGNOSIS — I130 Hypertensive heart and chronic kidney disease with heart failure and stage 1 through stage 4 chronic kidney disease, or unspecified chronic kidney disease: Secondary | ICD-10-CM | POA: Diagnosis not present

## 2018-07-28 DIAGNOSIS — M62838 Other muscle spasm: Secondary | ICD-10-CM | POA: Diagnosis not present

## 2018-07-28 DIAGNOSIS — I5042 Chronic combined systolic (congestive) and diastolic (congestive) heart failure: Secondary | ICD-10-CM | POA: Diagnosis not present

## 2018-07-29 DIAGNOSIS — M1612 Unilateral primary osteoarthritis, left hip: Secondary | ICD-10-CM | POA: Diagnosis not present

## 2018-07-29 DIAGNOSIS — I4891 Unspecified atrial fibrillation: Secondary | ICD-10-CM | POA: Diagnosis not present

## 2018-07-29 DIAGNOSIS — Z87891 Personal history of nicotine dependence: Secondary | ICD-10-CM | POA: Diagnosis not present

## 2018-07-29 DIAGNOSIS — E1142 Type 2 diabetes mellitus with diabetic polyneuropathy: Secondary | ICD-10-CM | POA: Diagnosis not present

## 2018-07-29 DIAGNOSIS — I5042 Chronic combined systolic (congestive) and diastolic (congestive) heart failure: Secondary | ICD-10-CM | POA: Diagnosis not present

## 2018-07-29 DIAGNOSIS — E1122 Type 2 diabetes mellitus with diabetic chronic kidney disease: Secondary | ICD-10-CM | POA: Diagnosis not present

## 2018-07-29 DIAGNOSIS — E1165 Type 2 diabetes mellitus with hyperglycemia: Secondary | ICD-10-CM | POA: Diagnosis not present

## 2018-07-29 DIAGNOSIS — M62838 Other muscle spasm: Secondary | ICD-10-CM | POA: Diagnosis not present

## 2018-07-29 DIAGNOSIS — I130 Hypertensive heart and chronic kidney disease with heart failure and stage 1 through stage 4 chronic kidney disease, or unspecified chronic kidney disease: Secondary | ICD-10-CM | POA: Diagnosis not present

## 2018-07-29 DIAGNOSIS — Z5181 Encounter for therapeutic drug level monitoring: Secondary | ICD-10-CM | POA: Diagnosis not present

## 2018-07-29 DIAGNOSIS — N183 Chronic kidney disease, stage 3 (moderate): Secondary | ICD-10-CM | POA: Diagnosis not present

## 2018-07-29 DIAGNOSIS — Z7901 Long term (current) use of anticoagulants: Secondary | ICD-10-CM | POA: Diagnosis not present

## 2018-07-30 DIAGNOSIS — M62838 Other muscle spasm: Secondary | ICD-10-CM | POA: Diagnosis not present

## 2018-07-30 DIAGNOSIS — E1142 Type 2 diabetes mellitus with diabetic polyneuropathy: Secondary | ICD-10-CM | POA: Diagnosis not present

## 2018-07-30 DIAGNOSIS — E1122 Type 2 diabetes mellitus with diabetic chronic kidney disease: Secondary | ICD-10-CM | POA: Diagnosis not present

## 2018-07-30 DIAGNOSIS — E1165 Type 2 diabetes mellitus with hyperglycemia: Secondary | ICD-10-CM | POA: Diagnosis not present

## 2018-07-30 DIAGNOSIS — I5042 Chronic combined systolic (congestive) and diastolic (congestive) heart failure: Secondary | ICD-10-CM | POA: Diagnosis not present

## 2018-07-30 DIAGNOSIS — N183 Chronic kidney disease, stage 3 (moderate): Secondary | ICD-10-CM | POA: Diagnosis not present

## 2018-07-30 DIAGNOSIS — I4891 Unspecified atrial fibrillation: Secondary | ICD-10-CM | POA: Diagnosis not present

## 2018-07-30 DIAGNOSIS — M1612 Unilateral primary osteoarthritis, left hip: Secondary | ICD-10-CM | POA: Diagnosis not present

## 2018-07-30 DIAGNOSIS — Z87891 Personal history of nicotine dependence: Secondary | ICD-10-CM | POA: Diagnosis not present

## 2018-07-30 DIAGNOSIS — Z5181 Encounter for therapeutic drug level monitoring: Secondary | ICD-10-CM | POA: Diagnosis not present

## 2018-07-30 DIAGNOSIS — Z7901 Long term (current) use of anticoagulants: Secondary | ICD-10-CM | POA: Diagnosis not present

## 2018-07-30 DIAGNOSIS — I130 Hypertensive heart and chronic kidney disease with heart failure and stage 1 through stage 4 chronic kidney disease, or unspecified chronic kidney disease: Secondary | ICD-10-CM | POA: Diagnosis not present

## 2018-07-31 DIAGNOSIS — M62838 Other muscle spasm: Secondary | ICD-10-CM | POA: Diagnosis not present

## 2018-07-31 DIAGNOSIS — I130 Hypertensive heart and chronic kidney disease with heart failure and stage 1 through stage 4 chronic kidney disease, or unspecified chronic kidney disease: Secondary | ICD-10-CM | POA: Diagnosis not present

## 2018-07-31 DIAGNOSIS — I5042 Chronic combined systolic (congestive) and diastolic (congestive) heart failure: Secondary | ICD-10-CM | POA: Diagnosis not present

## 2018-07-31 DIAGNOSIS — Z87891 Personal history of nicotine dependence: Secondary | ICD-10-CM | POA: Diagnosis not present

## 2018-07-31 DIAGNOSIS — E1122 Type 2 diabetes mellitus with diabetic chronic kidney disease: Secondary | ICD-10-CM | POA: Diagnosis not present

## 2018-07-31 DIAGNOSIS — M1612 Unilateral primary osteoarthritis, left hip: Secondary | ICD-10-CM | POA: Diagnosis not present

## 2018-07-31 DIAGNOSIS — N183 Chronic kidney disease, stage 3 (moderate): Secondary | ICD-10-CM | POA: Diagnosis not present

## 2018-07-31 DIAGNOSIS — Z5181 Encounter for therapeutic drug level monitoring: Secondary | ICD-10-CM | POA: Diagnosis not present

## 2018-07-31 DIAGNOSIS — Z7901 Long term (current) use of anticoagulants: Secondary | ICD-10-CM | POA: Diagnosis not present

## 2018-07-31 DIAGNOSIS — E1142 Type 2 diabetes mellitus with diabetic polyneuropathy: Secondary | ICD-10-CM | POA: Diagnosis not present

## 2018-07-31 DIAGNOSIS — I4891 Unspecified atrial fibrillation: Secondary | ICD-10-CM | POA: Diagnosis not present

## 2018-07-31 DIAGNOSIS — E1165 Type 2 diabetes mellitus with hyperglycemia: Secondary | ICD-10-CM | POA: Diagnosis not present

## 2018-08-03 DIAGNOSIS — Z87891 Personal history of nicotine dependence: Secondary | ICD-10-CM | POA: Diagnosis not present

## 2018-08-03 DIAGNOSIS — N183 Chronic kidney disease, stage 3 (moderate): Secondary | ICD-10-CM | POA: Diagnosis not present

## 2018-08-03 DIAGNOSIS — I4891 Unspecified atrial fibrillation: Secondary | ICD-10-CM | POA: Diagnosis not present

## 2018-08-03 DIAGNOSIS — Z7901 Long term (current) use of anticoagulants: Secondary | ICD-10-CM | POA: Diagnosis not present

## 2018-08-03 DIAGNOSIS — Z5181 Encounter for therapeutic drug level monitoring: Secondary | ICD-10-CM | POA: Diagnosis not present

## 2018-08-03 DIAGNOSIS — I5042 Chronic combined systolic (congestive) and diastolic (congestive) heart failure: Secondary | ICD-10-CM | POA: Diagnosis not present

## 2018-08-03 DIAGNOSIS — M62838 Other muscle spasm: Secondary | ICD-10-CM | POA: Diagnosis not present

## 2018-08-03 DIAGNOSIS — E1165 Type 2 diabetes mellitus with hyperglycemia: Secondary | ICD-10-CM | POA: Diagnosis not present

## 2018-08-03 DIAGNOSIS — M1612 Unilateral primary osteoarthritis, left hip: Secondary | ICD-10-CM | POA: Diagnosis not present

## 2018-08-03 DIAGNOSIS — I130 Hypertensive heart and chronic kidney disease with heart failure and stage 1 through stage 4 chronic kidney disease, or unspecified chronic kidney disease: Secondary | ICD-10-CM | POA: Diagnosis not present

## 2018-08-03 DIAGNOSIS — E1142 Type 2 diabetes mellitus with diabetic polyneuropathy: Secondary | ICD-10-CM | POA: Diagnosis not present

## 2018-08-03 DIAGNOSIS — E1122 Type 2 diabetes mellitus with diabetic chronic kidney disease: Secondary | ICD-10-CM | POA: Diagnosis not present

## 2018-08-06 DIAGNOSIS — E1165 Type 2 diabetes mellitus with hyperglycemia: Secondary | ICD-10-CM | POA: Diagnosis not present

## 2018-08-06 DIAGNOSIS — N183 Chronic kidney disease, stage 3 (moderate): Secondary | ICD-10-CM | POA: Diagnosis not present

## 2018-08-06 DIAGNOSIS — I4891 Unspecified atrial fibrillation: Secondary | ICD-10-CM | POA: Diagnosis not present

## 2018-08-06 DIAGNOSIS — I5042 Chronic combined systolic (congestive) and diastolic (congestive) heart failure: Secondary | ICD-10-CM | POA: Diagnosis not present

## 2018-08-06 DIAGNOSIS — E1142 Type 2 diabetes mellitus with diabetic polyneuropathy: Secondary | ICD-10-CM | POA: Diagnosis not present

## 2018-08-06 DIAGNOSIS — I130 Hypertensive heart and chronic kidney disease with heart failure and stage 1 through stage 4 chronic kidney disease, or unspecified chronic kidney disease: Secondary | ICD-10-CM | POA: Diagnosis not present

## 2018-08-06 DIAGNOSIS — Z7901 Long term (current) use of anticoagulants: Secondary | ICD-10-CM | POA: Diagnosis not present

## 2018-08-06 DIAGNOSIS — E1122 Type 2 diabetes mellitus with diabetic chronic kidney disease: Secondary | ICD-10-CM | POA: Diagnosis not present

## 2018-08-06 DIAGNOSIS — Z5181 Encounter for therapeutic drug level monitoring: Secondary | ICD-10-CM | POA: Diagnosis not present

## 2018-08-06 DIAGNOSIS — M1612 Unilateral primary osteoarthritis, left hip: Secondary | ICD-10-CM | POA: Diagnosis not present

## 2018-08-06 DIAGNOSIS — Z87891 Personal history of nicotine dependence: Secondary | ICD-10-CM | POA: Diagnosis not present

## 2018-08-06 DIAGNOSIS — M62838 Other muscle spasm: Secondary | ICD-10-CM | POA: Diagnosis not present

## 2018-08-07 DIAGNOSIS — N183 Chronic kidney disease, stage 3 (moderate): Secondary | ICD-10-CM | POA: Diagnosis not present

## 2018-08-07 DIAGNOSIS — M62838 Other muscle spasm: Secondary | ICD-10-CM | POA: Diagnosis not present

## 2018-08-07 DIAGNOSIS — Z5181 Encounter for therapeutic drug level monitoring: Secondary | ICD-10-CM | POA: Diagnosis not present

## 2018-08-07 DIAGNOSIS — E1122 Type 2 diabetes mellitus with diabetic chronic kidney disease: Secondary | ICD-10-CM | POA: Diagnosis not present

## 2018-08-07 DIAGNOSIS — M1612 Unilateral primary osteoarthritis, left hip: Secondary | ICD-10-CM | POA: Diagnosis not present

## 2018-08-07 DIAGNOSIS — I4891 Unspecified atrial fibrillation: Secondary | ICD-10-CM | POA: Diagnosis not present

## 2018-08-07 DIAGNOSIS — E1142 Type 2 diabetes mellitus with diabetic polyneuropathy: Secondary | ICD-10-CM | POA: Diagnosis not present

## 2018-08-07 DIAGNOSIS — Z87891 Personal history of nicotine dependence: Secondary | ICD-10-CM | POA: Diagnosis not present

## 2018-08-07 DIAGNOSIS — I5042 Chronic combined systolic (congestive) and diastolic (congestive) heart failure: Secondary | ICD-10-CM | POA: Diagnosis not present

## 2018-08-07 DIAGNOSIS — Z7901 Long term (current) use of anticoagulants: Secondary | ICD-10-CM | POA: Diagnosis not present

## 2018-08-07 DIAGNOSIS — E1165 Type 2 diabetes mellitus with hyperglycemia: Secondary | ICD-10-CM | POA: Diagnosis not present

## 2018-08-07 DIAGNOSIS — I130 Hypertensive heart and chronic kidney disease with heart failure and stage 1 through stage 4 chronic kidney disease, or unspecified chronic kidney disease: Secondary | ICD-10-CM | POA: Diagnosis not present

## 2018-08-10 DIAGNOSIS — N183 Chronic kidney disease, stage 3 (moderate): Secondary | ICD-10-CM | POA: Diagnosis not present

## 2018-08-10 DIAGNOSIS — E1122 Type 2 diabetes mellitus with diabetic chronic kidney disease: Secondary | ICD-10-CM | POA: Diagnosis not present

## 2018-08-10 DIAGNOSIS — E1142 Type 2 diabetes mellitus with diabetic polyneuropathy: Secondary | ICD-10-CM | POA: Diagnosis not present

## 2018-08-10 DIAGNOSIS — Z7901 Long term (current) use of anticoagulants: Secondary | ICD-10-CM | POA: Diagnosis not present

## 2018-08-10 DIAGNOSIS — I4891 Unspecified atrial fibrillation: Secondary | ICD-10-CM | POA: Diagnosis not present

## 2018-08-10 DIAGNOSIS — E1165 Type 2 diabetes mellitus with hyperglycemia: Secondary | ICD-10-CM | POA: Diagnosis not present

## 2018-08-10 DIAGNOSIS — M62838 Other muscle spasm: Secondary | ICD-10-CM | POA: Diagnosis not present

## 2018-08-10 DIAGNOSIS — I5042 Chronic combined systolic (congestive) and diastolic (congestive) heart failure: Secondary | ICD-10-CM | POA: Diagnosis not present

## 2018-08-10 DIAGNOSIS — Z87891 Personal history of nicotine dependence: Secondary | ICD-10-CM | POA: Diagnosis not present

## 2018-08-10 DIAGNOSIS — Z5181 Encounter for therapeutic drug level monitoring: Secondary | ICD-10-CM | POA: Diagnosis not present

## 2018-08-10 DIAGNOSIS — I130 Hypertensive heart and chronic kidney disease with heart failure and stage 1 through stage 4 chronic kidney disease, or unspecified chronic kidney disease: Secondary | ICD-10-CM | POA: Diagnosis not present

## 2018-08-10 DIAGNOSIS — M1612 Unilateral primary osteoarthritis, left hip: Secondary | ICD-10-CM | POA: Diagnosis not present

## 2018-08-11 DIAGNOSIS — Z5181 Encounter for therapeutic drug level monitoring: Secondary | ICD-10-CM | POA: Diagnosis not present

## 2018-08-11 DIAGNOSIS — N183 Chronic kidney disease, stage 3 (moderate): Secondary | ICD-10-CM | POA: Diagnosis not present

## 2018-08-11 DIAGNOSIS — I4891 Unspecified atrial fibrillation: Secondary | ICD-10-CM | POA: Diagnosis not present

## 2018-08-11 DIAGNOSIS — M1612 Unilateral primary osteoarthritis, left hip: Secondary | ICD-10-CM | POA: Diagnosis not present

## 2018-08-11 DIAGNOSIS — E1122 Type 2 diabetes mellitus with diabetic chronic kidney disease: Secondary | ICD-10-CM | POA: Diagnosis not present

## 2018-08-11 DIAGNOSIS — Z7901 Long term (current) use of anticoagulants: Secondary | ICD-10-CM | POA: Diagnosis not present

## 2018-08-11 DIAGNOSIS — E1142 Type 2 diabetes mellitus with diabetic polyneuropathy: Secondary | ICD-10-CM | POA: Diagnosis not present

## 2018-08-11 DIAGNOSIS — Z87891 Personal history of nicotine dependence: Secondary | ICD-10-CM | POA: Diagnosis not present

## 2018-08-11 DIAGNOSIS — I130 Hypertensive heart and chronic kidney disease with heart failure and stage 1 through stage 4 chronic kidney disease, or unspecified chronic kidney disease: Secondary | ICD-10-CM | POA: Diagnosis not present

## 2018-08-11 DIAGNOSIS — M62838 Other muscle spasm: Secondary | ICD-10-CM | POA: Diagnosis not present

## 2018-08-11 DIAGNOSIS — E1165 Type 2 diabetes mellitus with hyperglycemia: Secondary | ICD-10-CM | POA: Diagnosis not present

## 2018-08-11 DIAGNOSIS — I5042 Chronic combined systolic (congestive) and diastolic (congestive) heart failure: Secondary | ICD-10-CM | POA: Diagnosis not present

## 2018-08-13 DIAGNOSIS — E1122 Type 2 diabetes mellitus with diabetic chronic kidney disease: Secondary | ICD-10-CM | POA: Diagnosis not present

## 2018-08-13 DIAGNOSIS — I130 Hypertensive heart and chronic kidney disease with heart failure and stage 1 through stage 4 chronic kidney disease, or unspecified chronic kidney disease: Secondary | ICD-10-CM | POA: Diagnosis not present

## 2018-08-13 DIAGNOSIS — M62838 Other muscle spasm: Secondary | ICD-10-CM | POA: Diagnosis not present

## 2018-08-13 DIAGNOSIS — I4891 Unspecified atrial fibrillation: Secondary | ICD-10-CM | POA: Diagnosis not present

## 2018-08-13 DIAGNOSIS — E1165 Type 2 diabetes mellitus with hyperglycemia: Secondary | ICD-10-CM | POA: Diagnosis not present

## 2018-08-13 DIAGNOSIS — Z5181 Encounter for therapeutic drug level monitoring: Secondary | ICD-10-CM | POA: Diagnosis not present

## 2018-08-13 DIAGNOSIS — Z7901 Long term (current) use of anticoagulants: Secondary | ICD-10-CM | POA: Diagnosis not present

## 2018-08-13 DIAGNOSIS — I5042 Chronic combined systolic (congestive) and diastolic (congestive) heart failure: Secondary | ICD-10-CM | POA: Diagnosis not present

## 2018-08-13 DIAGNOSIS — E1142 Type 2 diabetes mellitus with diabetic polyneuropathy: Secondary | ICD-10-CM | POA: Diagnosis not present

## 2018-08-13 DIAGNOSIS — Z87891 Personal history of nicotine dependence: Secondary | ICD-10-CM | POA: Diagnosis not present

## 2018-08-13 DIAGNOSIS — M1612 Unilateral primary osteoarthritis, left hip: Secondary | ICD-10-CM | POA: Diagnosis not present

## 2018-08-13 DIAGNOSIS — N183 Chronic kidney disease, stage 3 (moderate): Secondary | ICD-10-CM | POA: Diagnosis not present

## 2018-08-14 ENCOUNTER — Encounter: Payer: Self-pay | Admitting: Family Medicine

## 2018-08-14 ENCOUNTER — Ambulatory Visit (INDEPENDENT_AMBULATORY_CARE_PROVIDER_SITE_OTHER): Payer: Medicare Other | Admitting: Family Medicine

## 2018-08-14 VITALS — BP 130/80 | HR 70 | Ht 72.0 in

## 2018-08-14 DIAGNOSIS — I5042 Chronic combined systolic (congestive) and diastolic (congestive) heart failure: Secondary | ICD-10-CM | POA: Diagnosis not present

## 2018-08-14 DIAGNOSIS — R5381 Other malaise: Secondary | ICD-10-CM

## 2018-08-14 DIAGNOSIS — N183 Chronic kidney disease, stage 3 unspecified: Secondary | ICD-10-CM

## 2018-08-14 DIAGNOSIS — E782 Mixed hyperlipidemia: Secondary | ICD-10-CM | POA: Diagnosis not present

## 2018-08-14 DIAGNOSIS — E1165 Type 2 diabetes mellitus with hyperglycemia: Secondary | ICD-10-CM | POA: Diagnosis not present

## 2018-08-14 DIAGNOSIS — Z794 Long term (current) use of insulin: Secondary | ICD-10-CM

## 2018-08-14 DIAGNOSIS — R6889 Other general symptoms and signs: Secondary | ICD-10-CM | POA: Diagnosis not present

## 2018-08-14 DIAGNOSIS — I10 Essential (primary) hypertension: Secondary | ICD-10-CM

## 2018-08-14 DIAGNOSIS — IMO0001 Reserved for inherently not codable concepts without codable children: Secondary | ICD-10-CM

## 2018-08-14 LAB — COMPREHENSIVE METABOLIC PANEL
ALBUMIN: 3.5 g/dL (ref 3.5–5.2)
ALT: 10 U/L (ref 0–53)
AST: 14 U/L (ref 0–37)
Alkaline Phosphatase: 27 U/L — ABNORMAL LOW (ref 39–117)
BILIRUBIN TOTAL: 0.8 mg/dL (ref 0.2–1.2)
BUN: 67 mg/dL — ABNORMAL HIGH (ref 6–23)
CALCIUM: 9.1 mg/dL (ref 8.4–10.5)
CO2: 30 meq/L (ref 19–32)
CREATININE: 2.57 mg/dL — AB (ref 0.40–1.50)
Chloride: 94 mEq/L — ABNORMAL LOW (ref 96–112)
GFR: 32.42 mL/min — AB (ref >60.00)
Glucose, Bld: 153 mg/dL — ABNORMAL HIGH (ref 70–99)
Potassium: 3.7 mEq/L (ref 3.5–5.1)
Sodium: 135 mEq/L (ref 135–145)
Total Protein: 7.8 g/dL (ref 6.0–8.3)

## 2018-08-14 LAB — LDL CHOLESTEROL, DIRECT: LDL DIRECT: 69 mg/dL

## 2018-08-14 LAB — CBC
HCT: 42.1 % (ref 39.0–52.0)
HEMOGLOBIN: 14.2 g/dL (ref 13.0–17.0)
MCHC: 33.8 g/dL (ref 30.0–36.0)
MCV: 96.7 fl (ref 78.0–100.0)
PLATELETS: 230 10*3/uL (ref 150.0–400.0)
RBC: 4.35 Mil/uL (ref 4.22–5.81)
RDW: 17.1 % — ABNORMAL HIGH (ref 11.5–15.5)
WBC: 11 10*3/uL — AB (ref 4.0–10.5)

## 2018-08-14 LAB — LIPID PANEL
CHOLESTEROL: 122 mg/dL (ref 0–200)
HDL: 34.6 mg/dL — ABNORMAL LOW (ref >39.00)
LDL Cholesterol: 65 mg/dL (ref 0–99)
NONHDL: 86.9
Total CHOL/HDL Ratio: 4
Triglycerides: 109 mg/dL (ref 0.0–149.0)
VLDL: 21.8 mg/dL (ref 0.0–40.0)

## 2018-08-14 LAB — HEMOGLOBIN A1C: HEMOGLOBIN A1C: 9.6 % — AB (ref 4.6–6.5)

## 2018-08-14 LAB — TSH: TSH: 1.61 u[IU]/mL (ref 0.35–4.50)

## 2018-08-14 NOTE — Addendum Note (Signed)
Addended by: Jon Billings on: 08/14/2018 02:30 PM   Modules accepted: Orders

## 2018-08-14 NOTE — Progress Notes (Addendum)
Subjective:  Patient ID: Kurt Cross, male    DOB: 05/04/1951  Age: 67 y.o. MRN: [id number]  CC: Establish Care   HPI KETRICK MATNEY presents for establishment of care accompanied by his wife.  This wheelchair bound patient obviously has an extensive past medical history and unfortunately he nor his wife is able to provide much history as to the extent and scope of his medical problems.  From what I can gather in the electronic health records in looking at his medicine list I do know that he has a history of poorly controlled diabetes that is currently insulin-dependent, hypertension, congestive heart failure, vascular disease, abnormal lipids, atrial fib/flutter, status post placement of cardiac pacemaker, chronic kidney disease, pressure sores and chronic wounds, chronic pain and debility.  He is status post nursing home placement from what I gather was for chronic wounds and physical therapy for debility?  He is currently wheelchair-bound and it was not clear to me about how long this is been the case for him.  At this time he is unable to ambulate on his own at all.  He is currently working with a physical therapy.  He tells me that he has been to multiple doctors for his ambulation problem problems but is not certain who are aware of these doctors were located.  He tells me that all of his wounds are healed.  He currently takes 20 units of NPH insulin at 8 in the morning and 5 PM.  He uses Novolin regular on a sliding scale for breakfast and his evening meal.  He does not eat lunch.  Unfortunately he was unable to afford Levemir.  He was listed his taking 3 different diuretics to include Lasix, Zaroxolyn and Spironolactone. He tells me that he also has seen a kidney specialist but wasn't sure when that appointment happened for him.  He was listed as being on chronic anticoagulation but tells me that this was discontinued.  History Tanay has a past medical history of AAA (abdominal aortic  aneurysm)/ 4.5 cm ascending per CT angio 08/01/13 (08/12/2013), Atrial fibrillation ([place]), Benign hypertension, Bradycardia, Cellulitis, CHF (congestive heart failure), NYHA class II ([place]), Chronic respiratory failure ([place]) (02/10/2016), Constipation, chronic (08/03/2013), Cough, Debility (02/10/2016), Diabetes mellitus, type 2 ([place]), Hernia of abdominal cavity, Mobitz type II block, Hyperlipidemia, Hypertension, Hypokalemia, Hypoxemia, Infected prosthetic mesh of abdominal wall with GIANT abscess s/p removal 08/14/2013 (08/14/2013), Kidney failure, Neuropathy, OSA (obstructive sleep apnea), and Sleep apnea.   He has a past surgical history that includes Appendectomy; Tonsillectomy and adenoidectomy; Nasal septum surgery; Bowel resection (1980s?); Ostomy take down (1980s?); Incisional hernia repair (1980s?); Knee surgery (Right); Incision and drainage abscess (N/A, 08/14/2013); and left heart catheterization with coronary angiogram (N/A, 03/17/2014).   His family history includes Alcoholism in his father; Diabetes in his mother; Heart Problems in his maternal grandmother.He reports that he quit smoking about 5 years ago. His smoking use included cigarettes. He has a 40.00 pack-year smoking history. He has never used smokeless tobacco. He reports that he drinks alcohol. He reports that he does not use drugs.  Outpatient Medications Prior to Visit  Medication Sig Dispense Refill  . carvedilol (COREG) 25 MG tablet Take 25 mg by mouth 2 (two) times daily.  3  . cyclobenzaprine (FLEXERIL) 5 MG tablet Take 5 mg by mouth 3 (three) times daily.    . furosemide (LASIX) 80 MG tablet Take 80 mg by mouth every other day.     . gabapentin (NEURONTIN)  300 MG capsule Take 300 mg by mouth every morning.    [name] Kitchen KLOR-CON M20 20 MEQ tablet Take 20 mg by mouth 2 (two) times daily.  3  . metolazone (ZAROXOLYN) 2.5 MG tablet Take 2.5 mg by mouth daily.    . rosuvastatin (CRESTOR) 10 MG tablet Take 10 mg by mouth daily.    . traZODone  (DESYREL) 50 MG tablet TAKE 2 TABLETS BY MOUTH ONCE DAILY AT BEDTIME AS NEEDED  3  . spironolactone (ALDACTONE) 25 MG tablet Take 1 tablet (25 mg total) by mouth 2 (two) times daily. 180 tablet 3  . warfarin (COUMADIN) 7.5 MG tablet Take 7.5 mg by mouth daily.     . collagenase (SANTYL) ointment Apply topically daily. Apply to left heel and right ischial wounds    . doxycycline (ADOXA) 100 MG tablet Take 1 tablet (100 mg total) by mouth 2 (two) times daily. 14 tablet 0  . insulin detemir (LEVEMIR) 100 UNIT/ML injection Inject 0.12 mLs (12 Units total) into the skin 2 (two) times daily.    . insulin lispro (HUMALOG) 100 UNIT/ML injection Inject 0-0.09 mLs (0-9 Units total) into the skin 3 (three) times daily with meals. CBG < 70: implement hypoglycemia protocol CBG 70 - 120: 0 units CBG 121 - 150: 1 unit CBG 151 - 200: 2 units CBG 201 - 250: 3 units CBG 251 - 300: 5 units CBG 301 - 350: 7 units CBG 351 - 400: 9 units CBG > 400: call MD.    . isosorbide-hydrALAZINE (BIDIL) 20-37.5 MG tablet Take 1 tablet by mouth 2 (two) times daily. 180 tablet 3  . OVER THE COUNTER MEDICATION Take 1 tablet by mouth daily. VITAMIN B-9 WITH FOLIC ACID    . oxyCODONE-acetaminophen (PERCOCET/ROXICET) 5-325 MG tablet Take one tablet by mouth every four hours for pain. Do not exceed 4gm of Tylenol in 24 hours 180 tablet 0   No facility-administered medications prior to visit.     ROS Review of Systems  Constitutional: Negative for chills, fatigue, fever and unexpected weight change.  HENT: Negative.   Eyes: Negative.   Respiratory: Negative for chest tightness, shortness of breath and wheezing.   Cardiovascular: Negative for chest pain and leg swelling.  Gastrointestinal: Negative for abdominal pain.  Endocrine: Negative for polyphagia and polyuria.  Genitourinary: Negative for decreased urine volume and difficulty urinating.  Musculoskeletal: Positive for gait problem.  Neurological: Positive for weakness.  Negative for speech difficulty.  Hematological: Does not bruise/bleed easily.  Psychiatric/Behavioral: Negative.     Objective:  BP 130/80 (BP Location: Left Arm, Patient Position: Sitting, Cuff Size: Large)   Pulse 70   Ht 6' (1.829 m)   SpO2 100%   BMI 38.38 kg/m   Physical Exam  Constitutional: He is oriented to person, place, and time. He appears well-developed and well-nourished. No distress.  HENT:  Head: Normocephalic and atraumatic.  Right Ear: External ear normal.  Left Ear: External ear normal.  Eyes: Right eye exhibits no discharge. Left eye exhibits no discharge. No scleral icterus.  Neck: No JVD present. No tracheal deviation present.  Cardiovascular: Normal rate, regular rhythm and normal heart sounds.  Pulmonary/Chest: Effort normal and breath sounds normal. No stridor. No respiratory distress. He has no wheezes. He has no rales.  Musculoskeletal: He exhibits no edema.  Neurological: He is alert and oriented to person, place, and time.  Skin: Skin is warm and dry. He is not diaphoretic.  Psychiatric: He has a normal mood and  affect. His behavior is normal.      Assessment & Plan:   Ashanti was seen today for establish care.  Diagnoses and all orders for this visit:  Chronic combined systolic and diastolic CHF (congestive heart failure) (HCC) -     TSH -     CBC -     Ambulatory referral to Cardiology  Essential hypertension -     Comprehensive metabolic panel -     Urinalysis, Routine w reflex microscopic -     Microalbumin / creatinine urine ratio -     CBC  Diabetes mellitus, insulin dependent (IDDM), uncontrolled (HCC) -     Comprehensive metabolic panel -     Hemoglobin A1c -     Urinalysis, Routine w reflex microscopic -     Microalbumin / creatinine urine ratio -     CBC  CKD (chronic kidney disease), stage III (HCC) -     Comprehensive metabolic panel -     Urinalysis, Routine w reflex microscopic -     Microalbumin / creatinine urine  ratio -     CBC -     Cancel: Ambulatory referral to Nephrology -     Ambulatory referral to Nephrology  Mixed hyperlipidemia -     Comprehensive metabolic panel -     LDL cholesterol, direct -     Lipid panel  Debility  Obesity, Class III, BMI 40-49.9 (morbid obesity) ([place])   I have discontinued Zvi A. Stembridge's oxyCODONE-acetaminophen, isosorbide-hydrALAZINE, spironolactone, OVER THE COUNTER MEDICATION, warfarin, doxycycline, insulin detemir, insulin lispro, and collagenase. I am also having him maintain his carvedilol, furosemide, rosuvastatin, gabapentin, cyclobenzaprine, KLOR-CON M20, metolazone, and traZODone.  No orders of the defined types were placed in this encounter.  Will start by checking appropriate labs for this gentleman.  Have asked for cardiac neurology referral for follow-up of his congestive heart failure, atrial flutter atrial fib and placement of the pacemaker.  I spent over 45 minutes with this patient with at least 50% of the time discussing his medical problems and planning how to move forward with his care.  Follow-up: Return in about 2 weeks (around 08/28/2018).  [name], MD

## 2018-08-16 DIAGNOSIS — I130 Hypertensive heart and chronic kidney disease with heart failure and stage 1 through stage 4 chronic kidney disease, or unspecified chronic kidney disease: Secondary | ICD-10-CM | POA: Diagnosis not present

## 2018-08-17 ENCOUNTER — Telehealth: Payer: Self-pay | Admitting: Family Medicine

## 2018-08-17 DIAGNOSIS — M1612 Unilateral primary osteoarthritis, left hip: Secondary | ICD-10-CM | POA: Diagnosis not present

## 2018-08-17 DIAGNOSIS — I130 Hypertensive heart and chronic kidney disease with heart failure and stage 1 through stage 4 chronic kidney disease, or unspecified chronic kidney disease: Secondary | ICD-10-CM | POA: Diagnosis not present

## 2018-08-17 DIAGNOSIS — E1122 Type 2 diabetes mellitus with diabetic chronic kidney disease: Secondary | ICD-10-CM | POA: Diagnosis not present

## 2018-08-17 DIAGNOSIS — N183 Chronic kidney disease, stage 3 (moderate): Secondary | ICD-10-CM | POA: Diagnosis not present

## 2018-08-17 DIAGNOSIS — E1165 Type 2 diabetes mellitus with hyperglycemia: Secondary | ICD-10-CM | POA: Diagnosis not present

## 2018-08-17 DIAGNOSIS — Z87891 Personal history of nicotine dependence: Secondary | ICD-10-CM | POA: Diagnosis not present

## 2018-08-17 DIAGNOSIS — E1142 Type 2 diabetes mellitus with diabetic polyneuropathy: Secondary | ICD-10-CM | POA: Diagnosis not present

## 2018-08-17 DIAGNOSIS — M62838 Other muscle spasm: Secondary | ICD-10-CM | POA: Diagnosis not present

## 2018-08-17 DIAGNOSIS — Z7901 Long term (current) use of anticoagulants: Secondary | ICD-10-CM | POA: Diagnosis not present

## 2018-08-17 DIAGNOSIS — I5042 Chronic combined systolic (congestive) and diastolic (congestive) heart failure: Secondary | ICD-10-CM | POA: Diagnosis not present

## 2018-08-17 DIAGNOSIS — Z5181 Encounter for therapeutic drug level monitoring: Secondary | ICD-10-CM | POA: Diagnosis not present

## 2018-08-17 DIAGNOSIS — I4891 Unspecified atrial fibrillation: Secondary | ICD-10-CM | POA: Diagnosis not present

## 2018-08-17 NOTE — Telephone Encounter (Signed)
Copied from [place] [id number]. Topic: Quick Communication - Home Health Verbal Orders >> Aug 17, 2018  1:07 PM Berneta Levins wrote: Caller/Agency: Jinny Blossom with [facility] Number: [phone], OK to leave a message Requesting OT/PT/Skilled Nursing/Social Work: skilled nursing and home health aid Frequency: skilled nursing - 1x a week for 4 weeks; home health aid - 2x a week for 4 weeks

## 2018-08-18 DIAGNOSIS — M62838 Other muscle spasm: Secondary | ICD-10-CM | POA: Diagnosis not present

## 2018-08-18 DIAGNOSIS — E1122 Type 2 diabetes mellitus with diabetic chronic kidney disease: Secondary | ICD-10-CM | POA: Diagnosis not present

## 2018-08-18 DIAGNOSIS — Z7901 Long term (current) use of anticoagulants: Secondary | ICD-10-CM | POA: Diagnosis not present

## 2018-08-18 DIAGNOSIS — M1612 Unilateral primary osteoarthritis, left hip: Secondary | ICD-10-CM | POA: Diagnosis not present

## 2018-08-18 DIAGNOSIS — E1142 Type 2 diabetes mellitus with diabetic polyneuropathy: Secondary | ICD-10-CM | POA: Diagnosis not present

## 2018-08-18 DIAGNOSIS — Z87891 Personal history of nicotine dependence: Secondary | ICD-10-CM | POA: Diagnosis not present

## 2018-08-18 DIAGNOSIS — I130 Hypertensive heart and chronic kidney disease with heart failure and stage 1 through stage 4 chronic kidney disease, or unspecified chronic kidney disease: Secondary | ICD-10-CM | POA: Diagnosis not present

## 2018-08-18 DIAGNOSIS — N183 Chronic kidney disease, stage 3 (moderate): Secondary | ICD-10-CM | POA: Diagnosis not present

## 2018-08-18 DIAGNOSIS — E1165 Type 2 diabetes mellitus with hyperglycemia: Secondary | ICD-10-CM | POA: Diagnosis not present

## 2018-08-18 DIAGNOSIS — I5042 Chronic combined systolic (congestive) and diastolic (congestive) heart failure: Secondary | ICD-10-CM | POA: Diagnosis not present

## 2018-08-18 DIAGNOSIS — Z5181 Encounter for therapeutic drug level monitoring: Secondary | ICD-10-CM | POA: Diagnosis not present

## 2018-08-18 DIAGNOSIS — I4891 Unspecified atrial fibrillation: Secondary | ICD-10-CM | POA: Diagnosis not present

## 2018-08-18 NOTE — Telephone Encounter (Signed)
I called and left Kurt Cross a voicemail with the info below.

## 2018-08-18 NOTE — Telephone Encounter (Signed)
Okay to move forward with all of that.

## 2018-08-20 ENCOUNTER — Telehealth: Payer: Self-pay | Admitting: Family Medicine

## 2018-08-20 DIAGNOSIS — I5042 Chronic combined systolic (congestive) and diastolic (congestive) heart failure: Secondary | ICD-10-CM | POA: Diagnosis not present

## 2018-08-20 DIAGNOSIS — Z7901 Long term (current) use of anticoagulants: Secondary | ICD-10-CM | POA: Diagnosis not present

## 2018-08-20 DIAGNOSIS — Z5181 Encounter for therapeutic drug level monitoring: Secondary | ICD-10-CM | POA: Diagnosis not present

## 2018-08-20 DIAGNOSIS — N183 Chronic kidney disease, stage 3 (moderate): Secondary | ICD-10-CM | POA: Diagnosis not present

## 2018-08-20 DIAGNOSIS — E1165 Type 2 diabetes mellitus with hyperglycemia: Secondary | ICD-10-CM | POA: Diagnosis not present

## 2018-08-20 DIAGNOSIS — M1612 Unilateral primary osteoarthritis, left hip: Secondary | ICD-10-CM | POA: Diagnosis not present

## 2018-08-20 DIAGNOSIS — M62838 Other muscle spasm: Secondary | ICD-10-CM | POA: Diagnosis not present

## 2018-08-20 DIAGNOSIS — E1122 Type 2 diabetes mellitus with diabetic chronic kidney disease: Secondary | ICD-10-CM | POA: Diagnosis not present

## 2018-08-20 DIAGNOSIS — E1142 Type 2 diabetes mellitus with diabetic polyneuropathy: Secondary | ICD-10-CM | POA: Diagnosis not present

## 2018-08-20 DIAGNOSIS — Z87891 Personal history of nicotine dependence: Secondary | ICD-10-CM | POA: Diagnosis not present

## 2018-08-20 DIAGNOSIS — I4891 Unspecified atrial fibrillation: Secondary | ICD-10-CM | POA: Diagnosis not present

## 2018-08-20 DIAGNOSIS — I130 Hypertensive heart and chronic kidney disease with heart failure and stage 1 through stage 4 chronic kidney disease, or unspecified chronic kidney disease: Secondary | ICD-10-CM | POA: Diagnosis not present

## 2018-08-20 NOTE — Telephone Encounter (Signed)
Copied from [place] [id number] [id number]. Topic: Quick Communication - Home Health Verbal Orders >> Aug 20, 2018  4:59 PM Blase Mess A wrote: Caller/Agency: [facility] Number: [phone] Requesting OT/PT/Skilled Nursing/Social Work: Requesting an extension of PT Frequency: 2x times a week for 3 more weeks

## 2018-08-21 NOTE — Telephone Encounter (Signed)
OK to give verbal orders 

## 2018-08-21 NOTE — Telephone Encounter (Signed)
Okay and thanks.

## 2018-08-24 DIAGNOSIS — R6889 Other general symptoms and signs: Secondary | ICD-10-CM | POA: Diagnosis not present

## 2018-08-24 DIAGNOSIS — M62838 Other muscle spasm: Secondary | ICD-10-CM | POA: Diagnosis not present

## 2018-08-24 DIAGNOSIS — M545 Low back pain: Secondary | ICD-10-CM | POA: Diagnosis not present

## 2018-08-24 DIAGNOSIS — I5042 Chronic combined systolic (congestive) and diastolic (congestive) heart failure: Secondary | ICD-10-CM | POA: Diagnosis not present

## 2018-08-24 DIAGNOSIS — M2550 Pain in unspecified joint: Secondary | ICD-10-CM | POA: Diagnosis not present

## 2018-08-24 DIAGNOSIS — M1612 Unilateral primary osteoarthritis, left hip: Secondary | ICD-10-CM | POA: Diagnosis not present

## 2018-08-24 DIAGNOSIS — I4891 Unspecified atrial fibrillation: Secondary | ICD-10-CM | POA: Diagnosis not present

## 2018-08-24 DIAGNOSIS — Z87891 Personal history of nicotine dependence: Secondary | ICD-10-CM | POA: Diagnosis not present

## 2018-08-24 DIAGNOSIS — N183 Chronic kidney disease, stage 3 (moderate): Secondary | ICD-10-CM | POA: Diagnosis not present

## 2018-08-24 DIAGNOSIS — G629 Polyneuropathy, unspecified: Secondary | ICD-10-CM | POA: Diagnosis not present

## 2018-08-24 DIAGNOSIS — G894 Chronic pain syndrome: Secondary | ICD-10-CM | POA: Diagnosis not present

## 2018-08-24 DIAGNOSIS — E1142 Type 2 diabetes mellitus with diabetic polyneuropathy: Secondary | ICD-10-CM | POA: Diagnosis not present

## 2018-08-24 DIAGNOSIS — E1122 Type 2 diabetes mellitus with diabetic chronic kidney disease: Secondary | ICD-10-CM | POA: Diagnosis not present

## 2018-08-24 DIAGNOSIS — Z5181 Encounter for therapeutic drug level monitoring: Secondary | ICD-10-CM | POA: Diagnosis not present

## 2018-08-24 DIAGNOSIS — E1165 Type 2 diabetes mellitus with hyperglycemia: Secondary | ICD-10-CM | POA: Diagnosis not present

## 2018-08-24 DIAGNOSIS — I130 Hypertensive heart and chronic kidney disease with heart failure and stage 1 through stage 4 chronic kidney disease, or unspecified chronic kidney disease: Secondary | ICD-10-CM | POA: Diagnosis not present

## 2018-08-24 DIAGNOSIS — Z7901 Long term (current) use of anticoagulants: Secondary | ICD-10-CM | POA: Diagnosis not present

## 2018-08-24 NOTE — Telephone Encounter (Signed)
Verbal orders given to Shanon Brow at [facility]

## 2018-08-25 ENCOUNTER — Encounter: Payer: Self-pay | Admitting: Family Medicine

## 2018-08-25 ENCOUNTER — Ambulatory Visit: Payer: Medicare Other | Admitting: Family Medicine

## 2018-08-25 VITALS — BP 128/70 | HR 77 | Ht 72.0 in

## 2018-08-25 DIAGNOSIS — Z794 Long term (current) use of insulin: Secondary | ICD-10-CM

## 2018-08-25 DIAGNOSIS — R5381 Other malaise: Secondary | ICD-10-CM

## 2018-08-25 DIAGNOSIS — I10 Essential (primary) hypertension: Secondary | ICD-10-CM

## 2018-08-25 DIAGNOSIS — M1612 Unilateral primary osteoarthritis, left hip: Secondary | ICD-10-CM | POA: Diagnosis not present

## 2018-08-25 DIAGNOSIS — N183 Chronic kidney disease, stage 3 unspecified: Secondary | ICD-10-CM

## 2018-08-25 DIAGNOSIS — I130 Hypertensive heart and chronic kidney disease with heart failure and stage 1 through stage 4 chronic kidney disease, or unspecified chronic kidney disease: Secondary | ICD-10-CM | POA: Diagnosis not present

## 2018-08-25 DIAGNOSIS — I5042 Chronic combined systolic (congestive) and diastolic (congestive) heart failure: Secondary | ICD-10-CM | POA: Diagnosis not present

## 2018-08-25 DIAGNOSIS — E1165 Type 2 diabetes mellitus with hyperglycemia: Secondary | ICD-10-CM

## 2018-08-25 DIAGNOSIS — Z87891 Personal history of nicotine dependence: Secondary | ICD-10-CM | POA: Diagnosis not present

## 2018-08-25 DIAGNOSIS — M62838 Other muscle spasm: Secondary | ICD-10-CM | POA: Diagnosis not present

## 2018-08-25 DIAGNOSIS — Z7901 Long term (current) use of anticoagulants: Secondary | ICD-10-CM | POA: Diagnosis not present

## 2018-08-25 DIAGNOSIS — E1122 Type 2 diabetes mellitus with diabetic chronic kidney disease: Secondary | ICD-10-CM | POA: Diagnosis not present

## 2018-08-25 DIAGNOSIS — I4891 Unspecified atrial fibrillation: Secondary | ICD-10-CM | POA: Diagnosis not present

## 2018-08-25 DIAGNOSIS — E1142 Type 2 diabetes mellitus with diabetic polyneuropathy: Secondary | ICD-10-CM | POA: Diagnosis not present

## 2018-08-25 DIAGNOSIS — Z5181 Encounter for therapeutic drug level monitoring: Secondary | ICD-10-CM | POA: Diagnosis not present

## 2018-08-25 DIAGNOSIS — R6889 Other general symptoms and signs: Secondary | ICD-10-CM | POA: Diagnosis not present

## 2018-08-25 DIAGNOSIS — IMO0001 Reserved for inherently not codable concepts without codable children: Secondary | ICD-10-CM

## 2018-08-25 MED ORDER — ACCU-CHEK AVIVA DEVI: 0 refills | Status: DC

## 2018-08-25 NOTE — Progress Notes (Addendum)
Subjective:  Patient ID: Kurt Cross, male    DOB: [birth year]  Age: 67 y.o. MRN: [id number]  CC: Follow-up   HPI VIC ESCO presents for follow-up of his diabetes.  His last hemoglobin A1c was 9.6.  He has been taking NPH 20 units at 8 AM and 5 PM.  He continues to work with the physical therapist and rehab nurse who have been coming to his home and helping him regain his strength.  His prior physician had ordered these therapies for him.  He tells me that they are working with him so that he might begin to use a walker.  Where he had enjoyed swimming in the past.  He is a retired Glass blower/designer.  He lives with his wife.  Outpatient Medications Prior to Visit  Medication Sig Dispense Refill  . carvedilol (COREG) 25 MG tablet Take 25 mg by mouth 2 (two) times daily.  3  . cyclobenzaprine (FLEXERIL) 5 MG tablet Take 5 mg by mouth 3 (three) times daily.    . furosemide (LASIX) 80 MG tablet Take 80 mg by mouth every other day.     . gabapentin (NEURONTIN) 300 MG capsule Take 300 mg by mouth every morning.    . insulin NPH Human (HUMULIN N,NOVOLIN N) 100 UNIT/ML injection Inject 20 units in the morning and 20 units in the evening.    . insulin regular (NOVOLIN R,HUMULIN R) 100 units/mL injection Use with sliding scale.    [name] Kitchen KLOR-CON M20 20 MEQ tablet Take 20 mg by mouth 2 (two) times daily.  3  . metolazone (ZAROXOLYN) 2.5 MG tablet Take 2.5 mg by mouth daily.    [name] Kitchen oxyCODONE-acetaminophen (PERCOCET) 10-325 MG tablet     . rosuvastatin (CRESTOR) 10 MG tablet Take 10 mg by mouth daily.    . traZODone (DESYREL) 50 MG tablet TAKE 2 TABLETS BY MOUTH ONCE DAILY AT BEDTIME AS NEEDED  3  . spironolactone (ALDACTONE) 25 MG tablet Take 1 tablet by mouth 2 (two) times daily.  2   No facility-administered medications prior to visit.     ROS Review of Systems  Constitutional: Negative.   Respiratory: Negative.   Cardiovascular: Negative.   Gastrointestinal: Negative.   Endocrine: Negative for  polyphagia and polyuria.  Genitourinary: Negative.   Neurological: Positive for weakness. Negative for syncope and headaches.  Psychiatric/Behavioral: Negative.     Objective:  BP 128/70 (BP Location: Left Arm, Patient Position: Sitting, Cuff Size: Normal)   Pulse 77   Ht 6' (1.829 m)   SpO2 96%   BMI 38.38 kg/m   BP Readings from Last 3 Encounters:  08/25/18 128/70  08/14/18 130/80  02/18/18 138/85    Wt Readings from Last 3 Encounters:  02/13/18 283 lb (128.4 kg)  09/04/16 299 lb (135.6 kg)  02/26/16 (!) 301 lb (136.5 kg)    Physical Exam  Constitutional: He is oriented to person, place, and time. He appears well-developed and well-nourished. No distress.  HENT:  Head: Normocephalic and atraumatic.  Right Ear: External ear normal.  Left Ear: External ear normal.  Eyes: Pupils are equal, round, and reactive to light. Conjunctivae and EOM are normal.  Neck: No JVD present. No tracheal deviation present.  Cardiovascular: Normal rate, regular rhythm and normal heart sounds.  Pulmonary/Chest: Effort normal and breath sounds normal.  Neurological: He is alert and oriented to person, place, and time. He has normal strength.  Skin: Skin is warm and dry. He is not diaphoretic.  Psychiatric: He has a normal mood and affect. His behavior is normal.    Lab Results  Component Value Date   WBC 11.0 (H) 08/14/2018   HGB 14.2 08/14/2018   HCT 42.1 08/14/2018   PLT 230.0 08/14/2018   GLUCOSE 153 (H) 08/14/2018   CHOL 122 08/14/2018   TRIG 109.0 08/14/2018   HDL 34.60 (L) 08/14/2018   LDLDIRECT 69.0 08/14/2018   LDLCALC 65 08/14/2018   ALT 10 08/14/2018   AST 14 08/14/2018   NA 135 08/14/2018   K 3.7 08/14/2018   CL 94 (L) 08/14/2018   CREATININE 2.57 (H) 08/14/2018   BUN 67 (H) 08/14/2018   CO2 30 08/14/2018   TSH 1.61 08/14/2018   INR 2.11 02/18/2018   HGBA1C 9.6 (H) 08/14/2018    Dg Chest 1 View  Result Date: 02/13/2018 CLINICAL DATA:  67 year old male with with  hyperglycemia, decreased p.o. intake. Recent surgery for debridement of a foot/heel wound. EXAM: CHEST  1 VIEW COMPARISON:  Portable chest radiographs 01/26/2016 and earlier. FINDINGS: Semi upright AP and lateral views of the chest. Chronic cardiomegaly and left chest cardiac pacemaker. Lordotic AP view today. Stable lung volumes. No pneumothorax, pulmonary edema, pleural effusion or confluent pulmonary opacity identified. No acute osseous abnormality identified. Paucity bowel gas in the upper abdomen. IMPRESSION: Stable cardiomegaly. No acute cardiopulmonary abnormality. Electronically Signed   By: Genevie Ann M.D.   On: 02/13/2018 16:55   Ct Pelvis Wo Contrast  Result Date: 02/13/2018 CLINICAL DATA:  Soft tissue swelling. Concern for sacral decubitus ulcer or abscess. EXAM: CT PELVIS WITHOUT CONTRAST TECHNIQUE: Multidetector CT imaging of the pelvis was performed following the standard protocol without intravenous contrast. COMPARISON:  CT abdomen pelvis 11/10/2017 FINDINGS: Urinary Tract:  No abnormality visualized. Bowel:  Unremarkable visualized pelvic bowel loops. Vascular/Lymphatic: Atherosclerotic calcification of the aorta and iliac arteries. Reproductive:  No mass or other significant abnormality Other:  None. Musculoskeletal: No suspicious bone lesions identified. There is no sacral decubitus ulcer or subcutaneous lesion. No abscess or fluid collection. IMPRESSION: No sacral decubitus ulcer or abscess. Aortic Atherosclerosis (ICD10-I70.0). Electronically Signed   By: Ulyses Jarred M.D.   On: 02/13/2018 17:14   Dg Foot Complete Left  Result Date: 02/13/2018 CLINICAL DATA:  Heel ulcer and foot pain EXAM: LEFT FOOT - COMPLETE 3+ VIEW COMPARISON:  Left foot radiograph 02/11/2018 FINDINGS: Diffuse osteopenia. No acute fracture or dislocation. Previously seen heel ulcer is not visible due to positioning. No visible osteolysis. IMPRESSION: Allowing for differences in positioning, unchanged radiographs of the  left foot without osseous acute abnormality. Electronically Signed   By: Ulyses Jarred M.D.   On: 02/13/2018 16:55   Ct Extremity Lower Left Wo Contrast  Result Date: 02/13/2018 CLINICAL DATA:  LEFT heel ulcer, sacral ulcer, atrial fibrillation, CHF, diabetes mellitus, fever, CHF, former smoker EXAM: CT OF THE LOWER LEFT EXTREMITY WITHOUT CONTRAST TECHNIQUE: Multidetector CT imaging of the lower left extremity was performed according to the standard protocol. Sagittal and coronal MPR images reconstructed from axial data set. COMPARISON:  CT pelvis 02/13/2018, FINDINGS: Bones/Joint/Cartilage Osseous demineralization. Visualized pelvis intact. Degenerative changes of LEFT hip joint with mild joint space narrowing and spur formation. Degenerative changes are also present at the LEFT knee joint with joint space narrowing, minimal spurring, chondrocalcinosis, and associated knee joint effusion. No fracture or dislocation. No bone destruction identified to suggest osteomyelitis. Mild scattered degenerative changes at intertarsal joints and at ankle joint. Ligaments Suboptimally assessed by CT. Muscles and Tendons Tendons suboptimally  assessed by CT. Fatty atrophy of the medial LEFT gastrocnemius muscle. Muscular planes appear atrophic but otherwise grossly normal appearance. Soft tissues Skin thickening and subcutaneous edema at the posteromedial LEFT heel region likely representing patient's heel ulcer. Soft tissue swelling extends into the medial soft tissues at the plantar arch. LEFT knee joint effusion. No definite hip joint effusion. Scattered atherosclerotic calcifications. No abnormal fluid collections to suggest abscess. Multiple normal to upper normal sized LEFT inguinal lymph nodes. LEFT inguinal hernia containing fat. Intrapelvic structures otherwise unremarkable. IMPRESSION: No CT evidence of bone destruction adjacent to the posteromedial heel ulcer to suggest osteomyelitis. Scattered degenerative changes  at LEFT hip, knee, and ankle/foot with associated LEFT knee joint effusion. Fatty atrophy of medial LEFT gastrocnemius muscle. Small LEFT inguinal hernia containing fat. Electronically Signed   By: Lavonia Dana M.D.   On: 02/13/2018 17:45    Assessment & Plan:   Navin was seen today for follow-up.  Diagnoses and all orders for this visit:  Chronic combined systolic and diastolic CHF (congestive heart failure) ([place])  Essential hypertension  Diabetes mellitus, insulin dependent (IDDM), uncontrolled (HCC)  Atrial fibrillation, unspecified type ([place])  Debility -     Ambulatory referral to Physical Therapy  Obesity, Class III, BMI 40-49.9 (morbid obesity) ([place])  CKD (chronic kidney disease), stage III ([place])  Other orders -     Blood Glucose Monitoring Suppl (ACCU-CHEK AVIVA) device; Use as instructed   I am having Deshane A. Matin start on [place]. I am also having him maintain his carvedilol, furosemide, rosuvastatin, gabapentin, cyclobenzaprine, KLOR-CON M20, metolazone, traZODone, insulin NPH Human, insulin regular, and oxyCODONE-acetaminophen.  Meds ordered this encounter  Medications  . Blood Glucose Monitoring Suppl (ACCU-CHEK AVIVA) device    Sig: Use as instructed    Dispense:  1 each    Refill:  0    Dx:E11.65   Strength testing today was good in his upper and lower extremities.  Encouraged him to continue working with physical therapy and hopefully he will be able to use a walker soon.  He has been able to lose some weight with his she is giving him fewer calories per meal.  He is watching his salt intake.  He has upcoming appointments with nephrology and cardiology.  His current regimen of diuretic therapy is puzzling to me.  He does not appear to be appropriate.  Encouraged him to be sure to check his preprandial blood sugars and give the appropriate dose of regular insulin according to his given sliding scale.  Due diligence this should help lower his hemoglobin  A1c closer to the normal range for him.  Follow-up: Return in about 3 months (around 11/25/2018).  [name], MD

## 2018-08-25 NOTE — Patient Instructions (Signed)
Chronic Kidney Disease, Adult Chronic kidney disease (CKD) happens when the kidneys are damaged during a time of 3 or more months. The kidneys are two organs that do many important jobs in the body. These jobs include:  Removing wastes and extra fluids from the blood.  Making hormones that maintain the amount of fluid in your tissues and blood vessels.  Making sure that the body has the right amount of fluids and chemicals.  Most of the time, this condition does not go away, but it can usually be controlled. Steps must be taken to slow down the kidney damage or stop it from getting worse. Otherwise, the kidneys may stop working. Follow these instructions at home:  Follow your diet as told by your doctor. You may need to avoid alcohol, salty foods (sodium), and foods that are high in potassium, calcium, and protein.  Take over-the-counter and prescription medicines only as told by your doctor. Do not take any new medicines unless your doctor says you can do that. These include vitamins and minerals. ? Medicines and nutritional supplements can make kidney damage worse. ? Your doctor may need to change how much medicine you take.  Do not use any tobacco products. These include cigarettes, chewing tobacco, and e-cigarettes. If you need help quitting, ask your doctor.  Keep all follow-up visits as told by your doctor. This is important.  Check your blood pressure. Tell your doctor if there are changes to your blood pressure.  Get to a healthy weight. Stay at that weight. If you need help with this, ask your doctor.  Start or continue an exercise plan. Try to exercise at least 30 minutes a day, 5 days a week.  Stay up-to-date with your shots (immunizations) as told by your doctor. Contact a doctor if:  Your symptoms get worse.  You have new symptoms. Get help right away if:  You have symptoms of end-stage kidney disease. These include: ? Headaches. ? Skin that is darker or lighter  than normal. ? Numbness in your hands or feet. ? Easy bruising. ? Having hiccups often. ? Chest pain. ? Shortness of breath. ? Stopping of menstrual periods in women.  You have a fever.  You are making very little pee (urine).  You have pain or bleeding when you pee (urinate). This information is not intended to replace advice given to you by your health care provider. Make sure you discuss any questions you have with your health care provider. Document Released: 12/18/2009 Document Revised: 02/29/2016 Document Reviewed: 05/22/2012 Elsevier Interactive Patient Education  2017 Elsevier Inc.  

## 2018-08-26 DIAGNOSIS — Z7901 Long term (current) use of anticoagulants: Secondary | ICD-10-CM | POA: Diagnosis not present

## 2018-08-26 DIAGNOSIS — E1142 Type 2 diabetes mellitus with diabetic polyneuropathy: Secondary | ICD-10-CM | POA: Diagnosis not present

## 2018-08-26 DIAGNOSIS — E1165 Type 2 diabetes mellitus with hyperglycemia: Secondary | ICD-10-CM | POA: Diagnosis not present

## 2018-08-26 DIAGNOSIS — I4891 Unspecified atrial fibrillation: Secondary | ICD-10-CM | POA: Diagnosis not present

## 2018-08-26 DIAGNOSIS — E1122 Type 2 diabetes mellitus with diabetic chronic kidney disease: Secondary | ICD-10-CM | POA: Diagnosis not present

## 2018-08-26 DIAGNOSIS — N183 Chronic kidney disease, stage 3 (moderate): Secondary | ICD-10-CM | POA: Diagnosis not present

## 2018-08-26 DIAGNOSIS — I130 Hypertensive heart and chronic kidney disease with heart failure and stage 1 through stage 4 chronic kidney disease, or unspecified chronic kidney disease: Secondary | ICD-10-CM | POA: Diagnosis not present

## 2018-08-26 DIAGNOSIS — M62838 Other muscle spasm: Secondary | ICD-10-CM | POA: Diagnosis not present

## 2018-08-26 DIAGNOSIS — Z5181 Encounter for therapeutic drug level monitoring: Secondary | ICD-10-CM | POA: Diagnosis not present

## 2018-08-26 DIAGNOSIS — M1612 Unilateral primary osteoarthritis, left hip: Secondary | ICD-10-CM | POA: Diagnosis not present

## 2018-08-26 DIAGNOSIS — I5042 Chronic combined systolic (congestive) and diastolic (congestive) heart failure: Secondary | ICD-10-CM | POA: Diagnosis not present

## 2018-08-26 DIAGNOSIS — Z87891 Personal history of nicotine dependence: Secondary | ICD-10-CM | POA: Diagnosis not present

## 2018-08-27 DIAGNOSIS — R6889 Other general symptoms and signs: Secondary | ICD-10-CM | POA: Diagnosis not present

## 2018-08-28 ENCOUNTER — Telehealth: Payer: Self-pay

## 2018-08-28 DIAGNOSIS — Z87891 Personal history of nicotine dependence: Secondary | ICD-10-CM | POA: Diagnosis not present

## 2018-08-28 DIAGNOSIS — N183 Chronic kidney disease, stage 3 (moderate): Secondary | ICD-10-CM | POA: Diagnosis not present

## 2018-08-28 DIAGNOSIS — E1142 Type 2 diabetes mellitus with diabetic polyneuropathy: Secondary | ICD-10-CM | POA: Diagnosis not present

## 2018-08-28 DIAGNOSIS — I130 Hypertensive heart and chronic kidney disease with heart failure and stage 1 through stage 4 chronic kidney disease, or unspecified chronic kidney disease: Secondary | ICD-10-CM | POA: Diagnosis not present

## 2018-08-28 DIAGNOSIS — Z7901 Long term (current) use of anticoagulants: Secondary | ICD-10-CM | POA: Diagnosis not present

## 2018-08-28 DIAGNOSIS — E1165 Type 2 diabetes mellitus with hyperglycemia: Secondary | ICD-10-CM | POA: Diagnosis not present

## 2018-08-28 DIAGNOSIS — E1122 Type 2 diabetes mellitus with diabetic chronic kidney disease: Secondary | ICD-10-CM | POA: Diagnosis not present

## 2018-08-28 DIAGNOSIS — Z5181 Encounter for therapeutic drug level monitoring: Secondary | ICD-10-CM | POA: Diagnosis not present

## 2018-08-28 DIAGNOSIS — M1612 Unilateral primary osteoarthritis, left hip: Secondary | ICD-10-CM | POA: Diagnosis not present

## 2018-08-28 DIAGNOSIS — I5042 Chronic combined systolic (congestive) and diastolic (congestive) heart failure: Secondary | ICD-10-CM | POA: Diagnosis not present

## 2018-08-28 DIAGNOSIS — M62838 Other muscle spasm: Secondary | ICD-10-CM | POA: Diagnosis not present

## 2018-08-28 DIAGNOSIS — I4891 Unspecified atrial fibrillation: Secondary | ICD-10-CM | POA: Diagnosis not present

## 2018-08-28 NOTE — Telephone Encounter (Signed)
Copied from [place] [phone]. Topic: General - Other >> Aug 28, 2018  1:22 PM Mcneil, Ja-Kwan wrote: Reason for CRM: Pt states he has been over to [facility] 3 different times but has not been able to urinate. Pt states he informed [facility] that he recently had labs drawn and they advised him to have his most recent labs forwarded to them. Pt stated he does not have a fax # he only has the phone # [phone]. Pt requests that [facility] be contacted and sent his most recent labs.

## 2018-08-28 NOTE — Telephone Encounter (Signed)
Faxed recent lab results.

## 2018-08-31 DIAGNOSIS — M62838 Other muscle spasm: Secondary | ICD-10-CM | POA: Diagnosis not present

## 2018-08-31 DIAGNOSIS — I5042 Chronic combined systolic (congestive) and diastolic (congestive) heart failure: Secondary | ICD-10-CM | POA: Diagnosis not present

## 2018-08-31 DIAGNOSIS — Z5181 Encounter for therapeutic drug level monitoring: Secondary | ICD-10-CM | POA: Diagnosis not present

## 2018-08-31 DIAGNOSIS — M1612 Unilateral primary osteoarthritis, left hip: Secondary | ICD-10-CM | POA: Diagnosis not present

## 2018-08-31 DIAGNOSIS — E1122 Type 2 diabetes mellitus with diabetic chronic kidney disease: Secondary | ICD-10-CM | POA: Diagnosis not present

## 2018-08-31 DIAGNOSIS — Z87891 Personal history of nicotine dependence: Secondary | ICD-10-CM | POA: Diagnosis not present

## 2018-08-31 DIAGNOSIS — E1165 Type 2 diabetes mellitus with hyperglycemia: Secondary | ICD-10-CM | POA: Diagnosis not present

## 2018-08-31 DIAGNOSIS — E1142 Type 2 diabetes mellitus with diabetic polyneuropathy: Secondary | ICD-10-CM | POA: Diagnosis not present

## 2018-08-31 DIAGNOSIS — Z7901 Long term (current) use of anticoagulants: Secondary | ICD-10-CM | POA: Diagnosis not present

## 2018-08-31 DIAGNOSIS — I4891 Unspecified atrial fibrillation: Secondary | ICD-10-CM | POA: Diagnosis not present

## 2018-08-31 DIAGNOSIS — N183 Chronic kidney disease, stage 3 (moderate): Secondary | ICD-10-CM | POA: Diagnosis not present

## 2018-08-31 DIAGNOSIS — I130 Hypertensive heart and chronic kidney disease with heart failure and stage 1 through stage 4 chronic kidney disease, or unspecified chronic kidney disease: Secondary | ICD-10-CM | POA: Diagnosis not present

## 2018-09-01 ENCOUNTER — Encounter: Payer: Self-pay | Admitting: Internal Medicine

## 2018-09-01 DIAGNOSIS — I5042 Chronic combined systolic (congestive) and diastolic (congestive) heart failure: Secondary | ICD-10-CM | POA: Diagnosis not present

## 2018-09-01 DIAGNOSIS — M1612 Unilateral primary osteoarthritis, left hip: Secondary | ICD-10-CM | POA: Diagnosis not present

## 2018-09-01 DIAGNOSIS — Z7901 Long term (current) use of anticoagulants: Secondary | ICD-10-CM | POA: Diagnosis not present

## 2018-09-01 DIAGNOSIS — I4891 Unspecified atrial fibrillation: Secondary | ICD-10-CM | POA: Diagnosis not present

## 2018-09-01 DIAGNOSIS — M62838 Other muscle spasm: Secondary | ICD-10-CM | POA: Diagnosis not present

## 2018-09-01 DIAGNOSIS — E1122 Type 2 diabetes mellitus with diabetic chronic kidney disease: Secondary | ICD-10-CM | POA: Diagnosis not present

## 2018-09-01 DIAGNOSIS — N183 Chronic kidney disease, stage 3 (moderate): Secondary | ICD-10-CM | POA: Diagnosis not present

## 2018-09-01 DIAGNOSIS — I130 Hypertensive heart and chronic kidney disease with heart failure and stage 1 through stage 4 chronic kidney disease, or unspecified chronic kidney disease: Secondary | ICD-10-CM | POA: Diagnosis not present

## 2018-09-01 DIAGNOSIS — Z5181 Encounter for therapeutic drug level monitoring: Secondary | ICD-10-CM | POA: Diagnosis not present

## 2018-09-01 DIAGNOSIS — E1142 Type 2 diabetes mellitus with diabetic polyneuropathy: Secondary | ICD-10-CM | POA: Diagnosis not present

## 2018-09-01 DIAGNOSIS — E1165 Type 2 diabetes mellitus with hyperglycemia: Secondary | ICD-10-CM | POA: Diagnosis not present

## 2018-09-01 DIAGNOSIS — Z87891 Personal history of nicotine dependence: Secondary | ICD-10-CM | POA: Diagnosis not present

## 2018-09-02 DIAGNOSIS — N183 Chronic kidney disease, stage 3 (moderate): Secondary | ICD-10-CM | POA: Diagnosis not present

## 2018-09-02 DIAGNOSIS — I130 Hypertensive heart and chronic kidney disease with heart failure and stage 1 through stage 4 chronic kidney disease, or unspecified chronic kidney disease: Secondary | ICD-10-CM | POA: Diagnosis not present

## 2018-09-02 DIAGNOSIS — E1142 Type 2 diabetes mellitus with diabetic polyneuropathy: Secondary | ICD-10-CM | POA: Diagnosis not present

## 2018-09-02 DIAGNOSIS — Z5181 Encounter for therapeutic drug level monitoring: Secondary | ICD-10-CM | POA: Diagnosis not present

## 2018-09-02 DIAGNOSIS — E1165 Type 2 diabetes mellitus with hyperglycemia: Secondary | ICD-10-CM | POA: Diagnosis not present

## 2018-09-02 DIAGNOSIS — E1122 Type 2 diabetes mellitus with diabetic chronic kidney disease: Secondary | ICD-10-CM | POA: Diagnosis not present

## 2018-09-02 DIAGNOSIS — M62838 Other muscle spasm: Secondary | ICD-10-CM | POA: Diagnosis not present

## 2018-09-02 DIAGNOSIS — I5042 Chronic combined systolic (congestive) and diastolic (congestive) heart failure: Secondary | ICD-10-CM | POA: Diagnosis not present

## 2018-09-02 DIAGNOSIS — Z87891 Personal history of nicotine dependence: Secondary | ICD-10-CM | POA: Diagnosis not present

## 2018-09-02 DIAGNOSIS — M1612 Unilateral primary osteoarthritis, left hip: Secondary | ICD-10-CM | POA: Diagnosis not present

## 2018-09-02 DIAGNOSIS — I4891 Unspecified atrial fibrillation: Secondary | ICD-10-CM | POA: Diagnosis not present

## 2018-09-02 DIAGNOSIS — Z7901 Long term (current) use of anticoagulants: Secondary | ICD-10-CM | POA: Diagnosis not present

## 2018-09-04 DIAGNOSIS — I130 Hypertensive heart and chronic kidney disease with heart failure and stage 1 through stage 4 chronic kidney disease, or unspecified chronic kidney disease: Secondary | ICD-10-CM | POA: Diagnosis not present

## 2018-09-04 DIAGNOSIS — E1122 Type 2 diabetes mellitus with diabetic chronic kidney disease: Secondary | ICD-10-CM | POA: Diagnosis not present

## 2018-09-04 DIAGNOSIS — E1142 Type 2 diabetes mellitus with diabetic polyneuropathy: Secondary | ICD-10-CM | POA: Diagnosis not present

## 2018-09-04 DIAGNOSIS — E1165 Type 2 diabetes mellitus with hyperglycemia: Secondary | ICD-10-CM | POA: Diagnosis not present

## 2018-09-04 DIAGNOSIS — I4891 Unspecified atrial fibrillation: Secondary | ICD-10-CM | POA: Diagnosis not present

## 2018-09-04 DIAGNOSIS — I5042 Chronic combined systolic (congestive) and diastolic (congestive) heart failure: Secondary | ICD-10-CM | POA: Diagnosis not present

## 2018-09-04 DIAGNOSIS — Z5181 Encounter for therapeutic drug level monitoring: Secondary | ICD-10-CM | POA: Diagnosis not present

## 2018-09-04 DIAGNOSIS — Z87891 Personal history of nicotine dependence: Secondary | ICD-10-CM | POA: Diagnosis not present

## 2018-09-04 DIAGNOSIS — N183 Chronic kidney disease, stage 3 (moderate): Secondary | ICD-10-CM | POA: Diagnosis not present

## 2018-09-04 DIAGNOSIS — M1612 Unilateral primary osteoarthritis, left hip: Secondary | ICD-10-CM | POA: Diagnosis not present

## 2018-09-04 DIAGNOSIS — Z7901 Long term (current) use of anticoagulants: Secondary | ICD-10-CM | POA: Diagnosis not present

## 2018-09-04 DIAGNOSIS — M62838 Other muscle spasm: Secondary | ICD-10-CM | POA: Diagnosis not present

## 2018-09-07 ENCOUNTER — Telehealth: Payer: Self-pay | Admitting: Family Medicine

## 2018-09-07 ENCOUNTER — Other Ambulatory Visit: Payer: Self-pay | Admitting: Family Medicine

## 2018-09-07 DIAGNOSIS — L89623 Pressure ulcer of left heel, stage 3: Secondary | ICD-10-CM | POA: Diagnosis not present

## 2018-09-07 DIAGNOSIS — L89891 Pressure ulcer of other site, stage 1: Secondary | ICD-10-CM | POA: Diagnosis not present

## 2018-09-07 NOTE — Telephone Encounter (Signed)
Agreed -

## 2018-09-07 NOTE — Telephone Encounter (Signed)
Verbal given to [facility].

## 2018-09-07 NOTE — Telephone Encounter (Signed)
Copied from [place] [phone]. Topic: Quick Communication - Rx Refill/Question >> Sep 07, 2018  8:46 AM Parke Poisson wrote: Medication: spironolactone (ALDACTONE) 25 MG tablet  Has the patient contacted their pharmacy? yes (Agent: If no, request that the patient contact the pharmacy for the refill.) (Agent: If yes, when and what did the pharmacy advise?)  Preferred Pharmacy (with phone number or street name): [facility] ([address] [place] [phone] (Phone) [phone] (Fax)   Agent: Please be advised that RX refills may take up to 3 business days. We ask that you follow-up with your pharmacy.

## 2018-09-07 NOTE — Telephone Encounter (Signed)
Copied from [place] [phone]. Topic: Quick Communication - Home Health Verbal Orders >> Sep 07, 2018  2:27 PM Alanda Slim E wrote: Caller/Agency: [facility] Number: [phone] ( Voicemail can be left )  Requesting Skilled Nursing ( For re-certification, assessment and observation)  Frequency: 2x's a week for 5 weeks   Pt is up for Re-certification.  Pt has a deep tissue injury on left heel and has had pressure ulcer on that foot before.

## 2018-09-08 DIAGNOSIS — M62838 Other muscle spasm: Secondary | ICD-10-CM | POA: Diagnosis not present

## 2018-09-08 DIAGNOSIS — G894 Chronic pain syndrome: Secondary | ICD-10-CM | POA: Diagnosis not present

## 2018-09-08 DIAGNOSIS — Z79899 Other long term (current) drug therapy: Secondary | ICD-10-CM | POA: Diagnosis not present

## 2018-09-08 DIAGNOSIS — I5042 Chronic combined systolic (congestive) and diastolic (congestive) heart failure: Secondary | ICD-10-CM | POA: Diagnosis not present

## 2018-09-08 DIAGNOSIS — I4891 Unspecified atrial fibrillation: Secondary | ICD-10-CM | POA: Diagnosis not present

## 2018-09-08 DIAGNOSIS — N183 Chronic kidney disease, stage 3 (moderate): Secondary | ICD-10-CM | POA: Diagnosis not present

## 2018-09-08 DIAGNOSIS — Z87891 Personal history of nicotine dependence: Secondary | ICD-10-CM | POA: Diagnosis not present

## 2018-09-08 DIAGNOSIS — Z7901 Long term (current) use of anticoagulants: Secondary | ICD-10-CM | POA: Diagnosis not present

## 2018-09-08 DIAGNOSIS — E1165 Type 2 diabetes mellitus with hyperglycemia: Secondary | ICD-10-CM | POA: Diagnosis not present

## 2018-09-08 DIAGNOSIS — Z5181 Encounter for therapeutic drug level monitoring: Secondary | ICD-10-CM | POA: Diagnosis not present

## 2018-09-08 DIAGNOSIS — E1122 Type 2 diabetes mellitus with diabetic chronic kidney disease: Secondary | ICD-10-CM | POA: Diagnosis not present

## 2018-09-08 DIAGNOSIS — M1612 Unilateral primary osteoarthritis, left hip: Secondary | ICD-10-CM | POA: Diagnosis not present

## 2018-09-08 DIAGNOSIS — E1142 Type 2 diabetes mellitus with diabetic polyneuropathy: Secondary | ICD-10-CM | POA: Diagnosis not present

## 2018-09-08 DIAGNOSIS — Z79891 Long term (current) use of opiate analgesic: Secondary | ICD-10-CM | POA: Diagnosis not present

## 2018-09-08 DIAGNOSIS — I130 Hypertensive heart and chronic kidney disease with heart failure and stage 1 through stage 4 chronic kidney disease, or unspecified chronic kidney disease: Secondary | ICD-10-CM | POA: Diagnosis not present

## 2018-09-08 NOTE — Telephone Encounter (Signed)
Requested medication (s) are due for refill today: yes  Requested medication (s) are on the active medication list: yes  Last refill:  08/16/18  Future visit scheduled: yes  Notes to clinic:  Historical provider    Requested Prescriptions  Pending Prescriptions Disp Refills   spironolactone (ALDACTONE) 25 MG tablet 90 tablet 2    Sig: Take 1 tablet (25 mg total) by mouth 2 (two) times daily.     Cardiovascular: Diuretics - Aldosterone Antagonist Failed - 09/07/2018  1:03 PM      Failed - Cr in normal range and within 360 days    Creat  Date Value Ref Range Status  02/22/2016 2.04 (H) 0.70 - 1.25 mg/dL Final   Creatinine, Ser  Date Value Ref Range Status  08/14/2018 2.57 (H) 0.40 - 1.50 mg/dL Final         Passed - K in normal range and within 360 days    Potassium  Date Value Ref Range Status  08/14/2018 3.7 3.5 - 5.1 mEq/L Final         Passed - Na in normal range and within 360 days    Sodium  Date Value Ref Range Status  08/14/2018 135 135 - 145 mEq/L Final         Passed - Last BP in normal range    BP Readings from Last 1 Encounters:  08/25/18 128/70         Passed - Valid encounter within last 6 months    Recent Outpatient Visits          2 weeks ago Chronic combined systolic and diastolic CHF (congestive heart failure) ([place])   [facility], Mortimer Fries, MD   3 weeks ago Chronic combined systolic and diastolic CHF (congestive heart failure) ([place])   [facility] Ethelene Hal, Mortimer Fries, MD      Future Appointments            In 2 months Ethelene Hal, Mortimer Fries, MD [facility]

## 2018-09-08 NOTE — Telephone Encounter (Signed)
Patient needs to be seeing cardiology for this.

## 2018-09-09 MED ORDER — SPIRONOLACTONE 25 MG PO TABS: 25.0000 mg | ORAL_TABLET | Freq: Two times a day (BID) | ORAL | 0 refills | Status: DC

## 2018-09-10 ENCOUNTER — Telehealth: Payer: Self-pay | Admitting: Family Medicine

## 2018-09-10 DIAGNOSIS — E1142 Type 2 diabetes mellitus with diabetic polyneuropathy: Secondary | ICD-10-CM | POA: Diagnosis not present

## 2018-09-10 DIAGNOSIS — N183 Chronic kidney disease, stage 3 (moderate): Secondary | ICD-10-CM | POA: Diagnosis not present

## 2018-09-10 DIAGNOSIS — I5042 Chronic combined systolic (congestive) and diastolic (congestive) heart failure: Secondary | ICD-10-CM | POA: Diagnosis not present

## 2018-09-10 DIAGNOSIS — E1122 Type 2 diabetes mellitus with diabetic chronic kidney disease: Secondary | ICD-10-CM | POA: Diagnosis not present

## 2018-09-10 DIAGNOSIS — E1165 Type 2 diabetes mellitus with hyperglycemia: Secondary | ICD-10-CM | POA: Diagnosis not present

## 2018-09-10 DIAGNOSIS — Z7901 Long term (current) use of anticoagulants: Secondary | ICD-10-CM | POA: Diagnosis not present

## 2018-09-10 DIAGNOSIS — M1612 Unilateral primary osteoarthritis, left hip: Secondary | ICD-10-CM | POA: Diagnosis not present

## 2018-09-10 DIAGNOSIS — I130 Hypertensive heart and chronic kidney disease with heart failure and stage 1 through stage 4 chronic kidney disease, or unspecified chronic kidney disease: Secondary | ICD-10-CM | POA: Diagnosis not present

## 2018-09-10 DIAGNOSIS — Z87891 Personal history of nicotine dependence: Secondary | ICD-10-CM | POA: Diagnosis not present

## 2018-09-10 DIAGNOSIS — I4891 Unspecified atrial fibrillation: Secondary | ICD-10-CM | POA: Diagnosis not present

## 2018-09-10 DIAGNOSIS — Z5181 Encounter for therapeutic drug level monitoring: Secondary | ICD-10-CM | POA: Diagnosis not present

## 2018-09-10 DIAGNOSIS — M62838 Other muscle spasm: Secondary | ICD-10-CM | POA: Diagnosis not present

## 2018-09-10 NOTE — Telephone Encounter (Signed)
Copied from [place]. Topic: Quick Communication - Home Health Verbal Orders >> Sep 10, 2018  3:50 PM Carolyn Stare wrote:  Shanon Brow with [facility] call to let Dr Ethelene Hal know that pt has reach his potential with home health rehab and is requesting that Dr Ethelene Hal refer patient to a      Neurological Out patient rehab for gait training,balance and strengthen    Callback Number   [phone]

## 2018-09-10 NOTE — Telephone Encounter (Signed)
Spoke with Kurt Cross, she stated she reconciled Mr. Knaak medications today, pt stated he still taking coumadin and doctor has not make any changes to this (spouse was not present at that moment).   She is concern since the pt stated he is taking this med and not sure if Dr. Ethelene Hal wants them to follow up on his INR (checking it) or we going to be the one to follow up.  Please advise, coumadin is not on his current med list.   Cant wait until next week.

## 2018-09-10 NOTE — Telephone Encounter (Signed)
Copied from [place] [phone]. Topic: Quick Communication - See Telephone Encounter >> Sep 10, 2018  4:10 PM Vernona Rieger wrote: CRM for notification. See Telephone encounter for: 09/10/18.  Megan , RN with [facility] called and said she knows he has been taking been taking coumadin for a while. She wants to know from Dr Ethelene Hal when was the last time his INR was checked? She did a medication review today on him. In her computer system it has him taking 5 mg but has a 7.5 mg tablet at home through another doctor. She wants to make sure he is taking what he needs. She said his wife was not there for her to ask.   Call back @ [phone], can leave a detailed message.

## 2018-09-10 NOTE — Telephone Encounter (Signed)
Yes, he should have INR checked if no one has been checking this.  Can HH check this?  If not he needs to come in for RN visit for coumadin.

## 2018-09-11 NOTE — Telephone Encounter (Signed)
Informed Megan as directed-verbalized understanding.  Will keep the office updated once he is seen by Cardiology.

## 2018-09-11 NOTE — Telephone Encounter (Signed)
Copied from [place] [phone]. Topic: Quick Communication - See Telephone Encounter >> Sep 11, 2018  2:25 PM Parke Poisson wrote: CRM for notification. See Telephone encounter for: 09/11/18.Magan from [facility] called to advise that pt;s INR is 1.8.He is taking coumadin 7.5 MG.Her call back nuber is [phone]

## 2018-09-11 NOTE — Telephone Encounter (Signed)
Spoke with Jinny Blossom, she stated his spouse confirm that he is taking coumadin med and Dr. Ethelene Hal was going to have him see cardiologist (not sure what happened this this).   Jinny Blossom said she will check it later on today and she will call it in. FYI

## 2018-09-11 NOTE — Telephone Encounter (Signed)
Recommend taking 1.5 tabs of 7.5mg  on Saturdays and 1 tab of 7.5mg  all other days.  Recheck INR in 2 weeks

## 2018-09-15 ENCOUNTER — Telehealth: Payer: Self-pay

## 2018-09-15 DIAGNOSIS — I130 Hypertensive heart and chronic kidney disease with heart failure and stage 1 through stage 4 chronic kidney disease, or unspecified chronic kidney disease: Secondary | ICD-10-CM | POA: Diagnosis not present

## 2018-09-15 DIAGNOSIS — Z5181 Encounter for therapeutic drug level monitoring: Secondary | ICD-10-CM | POA: Diagnosis not present

## 2018-09-15 DIAGNOSIS — M62838 Other muscle spasm: Secondary | ICD-10-CM | POA: Diagnosis not present

## 2018-09-15 DIAGNOSIS — M1612 Unilateral primary osteoarthritis, left hip: Secondary | ICD-10-CM | POA: Diagnosis not present

## 2018-09-15 DIAGNOSIS — Z87891 Personal history of nicotine dependence: Secondary | ICD-10-CM | POA: Diagnosis not present

## 2018-09-15 DIAGNOSIS — Z7901 Long term (current) use of anticoagulants: Secondary | ICD-10-CM | POA: Diagnosis not present

## 2018-09-15 DIAGNOSIS — E1142 Type 2 diabetes mellitus with diabetic polyneuropathy: Secondary | ICD-10-CM | POA: Diagnosis not present

## 2018-09-15 DIAGNOSIS — I4891 Unspecified atrial fibrillation: Secondary | ICD-10-CM | POA: Diagnosis not present

## 2018-09-15 DIAGNOSIS — N183 Chronic kidney disease, stage 3 (moderate): Secondary | ICD-10-CM | POA: Diagnosis not present

## 2018-09-15 DIAGNOSIS — E1122 Type 2 diabetes mellitus with diabetic chronic kidney disease: Secondary | ICD-10-CM | POA: Diagnosis not present

## 2018-09-15 DIAGNOSIS — I5042 Chronic combined systolic (congestive) and diastolic (congestive) heart failure: Secondary | ICD-10-CM | POA: Diagnosis not present

## 2018-09-15 DIAGNOSIS — E1165 Type 2 diabetes mellitus with hyperglycemia: Secondary | ICD-10-CM | POA: Diagnosis not present

## 2018-09-15 NOTE — Telephone Encounter (Signed)
Copied from [place]. Topic: Quick Communication - Home Health Verbal Orders >> Sep 10, 2018  3:50 PM Carolyn Stare wrote:  Shanon Brow with [facility] call to let Dr Ethelene Hal know that pt has reach his potential with home health rehab and is requesting that Dr Ethelene Hal refer patient to a      Neurological Out patient rehab for gait training,balance and strengthen    Callback Number   [phone] >> Sep 15, 2018  1:13 PM Yvette Rack wrote: Shanon Brow with [facility] called back to follow up on the request for referral to Outpatient Neuro. [place] requests call back. Cb# [phone]

## 2018-09-16 ENCOUNTER — Telehealth: Payer: Self-pay | Admitting: Family Medicine

## 2018-09-16 NOTE — Telephone Encounter (Signed)
Copied from [place] [phone]. Topic: Quick Communication - See Telephone Encounter >> Sep 16, 2018  9:47 AM Reyne Dumas L wrote: CRM for notification. See Telephone encounter for: 09/16/18.  Pt will be being discharged from [facility].  Megan with [facility] will see him tomorrow and discharge him on Monday. Jinny Blossom can be reached at [phone].

## 2018-09-17 ENCOUNTER — Ambulatory Visit (INDEPENDENT_AMBULATORY_CARE_PROVIDER_SITE_OTHER): Payer: Medicare Other | Admitting: Internal Medicine

## 2018-09-17 ENCOUNTER — Encounter: Payer: Self-pay | Admitting: Internal Medicine

## 2018-09-17 VITALS — BP 102/64 | HR 70 | Ht 72.0 in

## 2018-09-17 DIAGNOSIS — Z950 Presence of cardiac pacemaker: Secondary | ICD-10-CM

## 2018-09-17 DIAGNOSIS — I4891 Unspecified atrial fibrillation: Secondary | ICD-10-CM | POA: Diagnosis not present

## 2018-09-17 DIAGNOSIS — I5042 Chronic combined systolic (congestive) and diastolic (congestive) heart failure: Secondary | ICD-10-CM

## 2018-09-17 DIAGNOSIS — R001 Bradycardia, unspecified: Secondary | ICD-10-CM

## 2018-09-17 DIAGNOSIS — M62838 Other muscle spasm: Secondary | ICD-10-CM | POA: Diagnosis not present

## 2018-09-17 DIAGNOSIS — E1122 Type 2 diabetes mellitus with diabetic chronic kidney disease: Secondary | ICD-10-CM | POA: Diagnosis not present

## 2018-09-17 DIAGNOSIS — Z87891 Personal history of nicotine dependence: Secondary | ICD-10-CM | POA: Diagnosis not present

## 2018-09-17 DIAGNOSIS — E1142 Type 2 diabetes mellitus with diabetic polyneuropathy: Secondary | ICD-10-CM | POA: Diagnosis not present

## 2018-09-17 DIAGNOSIS — I130 Hypertensive heart and chronic kidney disease with heart failure and stage 1 through stage 4 chronic kidney disease, or unspecified chronic kidney disease: Secondary | ICD-10-CM | POA: Diagnosis not present

## 2018-09-17 DIAGNOSIS — N183 Chronic kidney disease, stage 3 (moderate): Secondary | ICD-10-CM | POA: Diagnosis not present

## 2018-09-17 DIAGNOSIS — Z7901 Long term (current) use of anticoagulants: Secondary | ICD-10-CM | POA: Diagnosis not present

## 2018-09-17 DIAGNOSIS — Z5181 Encounter for therapeutic drug level monitoring: Secondary | ICD-10-CM | POA: Diagnosis not present

## 2018-09-17 DIAGNOSIS — E1165 Type 2 diabetes mellitus with hyperglycemia: Secondary | ICD-10-CM | POA: Diagnosis not present

## 2018-09-17 DIAGNOSIS — M1612 Unilateral primary osteoarthritis, left hip: Secondary | ICD-10-CM | POA: Diagnosis not present

## 2018-09-17 NOTE — Progress Notes (Signed)
Kurt Maw, MD: Primary Cardiologist:  Dr Ellene Route is a 67 y.o. male with a h/o complete heart block sp PPM (MDT) intitally by Dr Lovena Le (06/18/2007) with generator change in [place] (04/29/2014) who presents today to establish care in the Electrophysiology device clinic.  He is s/p biv pacemaker by Dr Lovena Le originally for intermittent CHB and EF 35%.  He is chronically inactive and ill.  He had  MDT VIVA CRT-P generator change in [place] by Dr Rolla Etienne.  He has not had his device closely followed since that time.  He is device dependant.  He has permanent afib with severe LA enlargement.  EF 30-35% in 2017. He is not very active.  + chronic issues with SOB and debility.  + dependant edema.  Today, he  denies symptoms of palpitations, chest pain  dizziness, presyncope, syncope, or neurologic sequela.  The patientis tolerating medications without difficulties and is otherwise without complaint today.   Past Medical History:  Diagnosis Date  . AAA (abdominal aortic aneurysm)/ 4.5 cm ascending per CT angio 08/01/13 08/12/2013  . Atrial fibrillation ([place])   . Benign hypertension   . Cellulitis   . CHF (congestive heart failure), NYHA class II ([place])   . Chronic respiratory failure ([place]) 02/10/2016  . Complete heart block ([facility])    s/p PPM Bi-Ven  . Constipation, chronic 08/03/2013  . Debility 02/10/2016  . Diabetes mellitus, type 2 ([place])   . Hernia of abdominal cavity   . Hyperlipidemia   . Hypertension   . Hypokalemia   . Hypoxemia   . 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 Kidney failure   . Neuropathy   . OSA (obstructive sleep apnea)    Past Surgical History:  Procedure Laterality Date  . APPENDECTOMY    . biventricular pacemaker implantation  04/29/2014   MDT Viva CRT-P BiV pacemaker implanted by Dr Rolla Etienne in [place]  . BOWEL RESECTION  1980s?   ?colectomy/ostomy - Dr Elesa Hacker  . INCISION AND  DRAINAGE ABSCESS N/A 08/14/2013   Procedure: INCISION AND DRAINAGE ABSCESS, mesh ;  Surgeon: [name], MD;  Location: [facility];  Service: General;  Laterality: N/A;  . [place]?   Dr. Lindon Romp - w mesh  . KNEE SURGERY Right   . LEFT HEART CATHETERIZATION WITH CORONARY ANGIOGRAM N/A 03/17/2014   Procedure: LEFT HEART CATHETERIZATION WITH CORONARY ANGIOGRAM;  Surgeon: [name], MD;  Location: [facility] CATH [facility];  Service: Cardiovascular;  Laterality: N/A;  . NASAL SEPTUM SURGERY    . OSTOMY TAKE DOWN  1980s?   Dr Elesa Hacker  . TONSILLECTOMY AND ADENOIDECTOMY      Social History   Socioeconomic History  . Marital status: Married    Spouse name: Not on file  . Number of children: Not on file  . Years of education: Not on file  . Highest education level: Not on file  Occupational History  . Not on file  Social Needs  . Financial resource strain: Not on file  . Food insecurity:    Worry: Not on file    Inability: Not on file  . Transportation needs:    Medical: Not on file    Non-medical: Not on file  Tobacco Use  . Smoking status: Former Smoker    Packs/day: 1.00    Years: 40.00    Pack years: 40.00    Types: Cigarettes  Last attempt to quit: 08/21/2012    Years since quitting: 6.0  . Smokeless tobacco: Never Used  Substance and Sexual Activity  . Alcohol use: Yes    Alcohol/week: 0.0 standard drinks    Comment: occasional  . Drug use: No  . Sexual activity: Not on file  Lifestyle  . Physical activity:    Days per week: Not on file    Minutes per session: Not on file  . Stress: Not on file  Relationships  . Social connections:    Talks on phone: Not on file    Gets together: Not on file    Attends religious service: Not on file    Active member of club or organization: Not on file    Attends meetings of clubs or organizations: Not on file    Relationship status: Not on file  . Intimate partner violence:    Fear of current or ex partner:  Not on file    Emotionally abused: Not on file    Physically abused: Not on file    Forced sexual activity: Not on file  Other Topics Concern  . Not on file  Social History Narrative   Lives at [place] since 02/06/16   Married -[name]   Former smoker -stopped 2013   No Advance Directives    Family History  Problem Relation Age of Onset  . Diabetes Mother   . Alcoholism Father   . Heart Problems Maternal Grandmother     No Known Allergies  Current Outpatient Medications  Medication Sig Dispense Refill  . Blood Glucose Monitoring Suppl (ACCU-CHEK AVIVA) device Use as instructed 1 each 0  . carvedilol (COREG) 25 MG tablet Take 25 mg by mouth 2 (two) times daily.  3  . cyclobenzaprine (FLEXERIL) 5 MG tablet Take 5 mg by mouth 3 (three) times daily.    . furosemide (LASIX) 80 MG tablet Take 80 mg by mouth daily.     [name] Kitchen gabapentin (NEURONTIN) 300 MG capsule Take 300 mg by mouth every morning.    . insulin NPH Human (HUMULIN N,NOVOLIN N) 100 UNIT/ML injection Inject 20 units in the morning and 20 units in the evening.    . insulin regular (NOVOLIN R,HUMULIN R) 100 units/mL injection Use with sliding scale.    [name] Kitchen KLOR-CON M20 20 MEQ tablet Take 20 mg by mouth 2 (two) times daily.  3  . metolazone (ZAROXOLYN) 2.5 MG tablet Take 2.5 mg by mouth daily.    [name] Kitchen oxyCODONE-acetaminophen (PERCOCET) 10-325 MG tablet     . rosuvastatin (CRESTOR) 10 MG tablet Take 10 mg by mouth daily.    [name] Kitchen spironolactone (ALDACTONE) 25 MG tablet Take 1 tablet (25 mg total) by mouth 2 (two) times daily. 30 tablet 0  . traZODone (DESYREL) 50 MG tablet TAKE 2 TABLETS BY MOUTH ONCE DAILY AT BEDTIME AS NEEDED  3   No current facility-administered medications for this visit.     ROS- all systems are reviewed and negative except as per HPI  Physical Exam: Vitals:   09/17/18 1439  BP: 102/64  Pulse: 70  SpO2: 96%  Height: 6' (1.829 m)   Wt Readings from Last 3 Encounters:  02/13/18 283 lb (128.4 kg)  09/04/16  299 lb (135.6 kg)  02/26/16 (!) 301 lb (136.5 kg)   GEN- The patient is chronically ill appearing, morbidly obese, alert and oriented x 3 today.   Head- normocephalic, atraumatic Eyes-  Sclera clear, conjunctiva pink Ears- hearing intact Oropharynx- clear Neck- supple,  Lungs- Clear to ausculation bilaterally, normal work of breathing Chest- pacemaker pocket is well healed Heart- Regular rate and rhythm (paced) GI- soft, NT, ND, + BS Extremities- no clubbing, cyanosis, + edema MS- diffuse atrophy Skin- no rash or lesion Psych- euthymic mood, full affect Neuro- strength and sensation are intact  Pacemaker interrogation- reviewed in detail today,  See PACEART report  Assessment and Plan:   1. Complete heart block Normal BiV pacemaker function See [facility] Art report No changes today 7-14 months estimated battery longevity Not previously compliant with remotes. Will have him return in 2 months for battery check Importance of remote compliance encouraged We will request a monitor for him.  2. Nonischemic CM EF 2017 30% Overdue to see general cardiology Not a candidate for ICD due to debility May benefit from entresto  3. Morbid obesity Body mass index is 38.38 kg/m. Lifestyle modification encouraged  4. Permanent afib Rate controlled On coumadin  Remote monitoring and close device follow-up as above Return to see Dr Lovena Le in a year (implanted initial device) Follow-up to be arranged with Dr Meda Coffee  Importance of device follow-up stressed today given that he is device dependant and has 7-14 months estimated longevity  [name] MD, [facility] 09/17/2018 3:21 PM

## 2018-09-17 NOTE — Patient Instructions (Addendum)
Medication Instructions:  Your physician recommends that you continue on your current medications as directed. Please refer to the Current Medication list given to you today.  Labwork: None ordered.  Testing/Procedures: None ordered.  Follow-Up:  You will follow up with Dr. Meda Coffee first available for routine follow up (overdue).  You will follow up with the device clinic in 2 months for a device check.  Your physician wants you to follow-up in: one year with Dr. Lovena Le.   You will receive a reminder letter in the mail two months in advance. If you don't receive a letter, please call our office to schedule the follow-up appointment.  Any Other Special Instructions Will Be Listed Below (If Applicable).  If you need a refill on your cardiac medications before your next appointment, please call your pharmacy.

## 2018-09-17 NOTE — Addendum Note (Signed)
Addended by: Jon Billings on: 09/17/2018 02:08 PM   Modules accepted: Orders

## 2018-09-18 DIAGNOSIS — E1122 Type 2 diabetes mellitus with diabetic chronic kidney disease: Secondary | ICD-10-CM | POA: Diagnosis not present

## 2018-09-18 DIAGNOSIS — E1142 Type 2 diabetes mellitus with diabetic polyneuropathy: Secondary | ICD-10-CM | POA: Diagnosis not present

## 2018-09-18 DIAGNOSIS — Z7901 Long term (current) use of anticoagulants: Secondary | ICD-10-CM | POA: Diagnosis not present

## 2018-09-18 DIAGNOSIS — Z5181 Encounter for therapeutic drug level monitoring: Secondary | ICD-10-CM | POA: Diagnosis not present

## 2018-09-18 DIAGNOSIS — E1165 Type 2 diabetes mellitus with hyperglycemia: Secondary | ICD-10-CM | POA: Diagnosis not present

## 2018-09-18 DIAGNOSIS — Z87891 Personal history of nicotine dependence: Secondary | ICD-10-CM | POA: Diagnosis not present

## 2018-09-18 DIAGNOSIS — N183 Chronic kidney disease, stage 3 (moderate): Secondary | ICD-10-CM | POA: Diagnosis not present

## 2018-09-18 DIAGNOSIS — I4891 Unspecified atrial fibrillation: Secondary | ICD-10-CM | POA: Diagnosis not present

## 2018-09-18 DIAGNOSIS — M62838 Other muscle spasm: Secondary | ICD-10-CM | POA: Diagnosis not present

## 2018-09-18 DIAGNOSIS — M1612 Unilateral primary osteoarthritis, left hip: Secondary | ICD-10-CM | POA: Diagnosis not present

## 2018-09-18 DIAGNOSIS — I130 Hypertensive heart and chronic kidney disease with heart failure and stage 1 through stage 4 chronic kidney disease, or unspecified chronic kidney disease: Secondary | ICD-10-CM | POA: Diagnosis not present

## 2018-09-18 DIAGNOSIS — I5042 Chronic combined systolic (congestive) and diastolic (congestive) heart failure: Secondary | ICD-10-CM | POA: Diagnosis not present

## 2018-09-21 DIAGNOSIS — E1142 Type 2 diabetes mellitus with diabetic polyneuropathy: Secondary | ICD-10-CM | POA: Diagnosis not present

## 2018-09-21 DIAGNOSIS — I4891 Unspecified atrial fibrillation: Secondary | ICD-10-CM | POA: Diagnosis not present

## 2018-09-21 DIAGNOSIS — Z79899 Other long term (current) drug therapy: Secondary | ICD-10-CM | POA: Diagnosis not present

## 2018-09-21 DIAGNOSIS — I5042 Chronic combined systolic (congestive) and diastolic (congestive) heart failure: Secondary | ICD-10-CM | POA: Diagnosis not present

## 2018-09-21 DIAGNOSIS — M1612 Unilateral primary osteoarthritis, left hip: Secondary | ICD-10-CM | POA: Diagnosis not present

## 2018-09-21 DIAGNOSIS — G894 Chronic pain syndrome: Secondary | ICD-10-CM | POA: Diagnosis not present

## 2018-09-21 DIAGNOSIS — N183 Chronic kidney disease, stage 3 (moderate): Secondary | ICD-10-CM | POA: Diagnosis not present

## 2018-09-21 DIAGNOSIS — I130 Hypertensive heart and chronic kidney disease with heart failure and stage 1 through stage 4 chronic kidney disease, or unspecified chronic kidney disease: Secondary | ICD-10-CM | POA: Diagnosis not present

## 2018-09-21 DIAGNOSIS — M2550 Pain in unspecified joint: Secondary | ICD-10-CM | POA: Diagnosis not present

## 2018-09-21 DIAGNOSIS — M545 Low back pain: Secondary | ICD-10-CM | POA: Diagnosis not present

## 2018-09-21 DIAGNOSIS — M62838 Other muscle spasm: Secondary | ICD-10-CM | POA: Diagnosis not present

## 2018-09-21 DIAGNOSIS — Z79891 Long term (current) use of opiate analgesic: Secondary | ICD-10-CM | POA: Diagnosis not present

## 2018-09-21 DIAGNOSIS — G629 Polyneuropathy, unspecified: Secondary | ICD-10-CM | POA: Diagnosis not present

## 2018-09-21 DIAGNOSIS — E1122 Type 2 diabetes mellitus with diabetic chronic kidney disease: Secondary | ICD-10-CM | POA: Diagnosis not present

## 2018-09-21 DIAGNOSIS — Z87891 Personal history of nicotine dependence: Secondary | ICD-10-CM | POA: Diagnosis not present

## 2018-09-21 DIAGNOSIS — Z5181 Encounter for therapeutic drug level monitoring: Secondary | ICD-10-CM | POA: Diagnosis not present

## 2018-09-21 DIAGNOSIS — Z7901 Long term (current) use of anticoagulants: Secondary | ICD-10-CM | POA: Diagnosis not present

## 2018-09-21 DIAGNOSIS — E1165 Type 2 diabetes mellitus with hyperglycemia: Secondary | ICD-10-CM | POA: Diagnosis not present

## 2018-09-22 DIAGNOSIS — I129 Hypertensive chronic kidney disease with stage 1 through stage 4 chronic kidney disease, or unspecified chronic kidney disease: Secondary | ICD-10-CM | POA: Diagnosis not present

## 2018-09-22 DIAGNOSIS — E1122 Type 2 diabetes mellitus with diabetic chronic kidney disease: Secondary | ICD-10-CM | POA: Diagnosis not present

## 2018-09-22 DIAGNOSIS — N183 Chronic kidney disease, stage 3 (moderate): Secondary | ICD-10-CM | POA: Diagnosis not present

## 2018-09-22 DIAGNOSIS — D631 Anemia in chronic kidney disease: Secondary | ICD-10-CM | POA: Diagnosis not present

## 2018-09-22 DIAGNOSIS — N2581 Secondary hyperparathyroidism of renal origin: Secondary | ICD-10-CM | POA: Diagnosis not present

## 2018-10-14 DIAGNOSIS — N183 Chronic kidney disease, stage 3 (moderate): Secondary | ICD-10-CM | POA: Diagnosis not present

## 2018-10-14 DIAGNOSIS — R6889 Other general symptoms and signs: Secondary | ICD-10-CM | POA: Diagnosis not present

## 2018-10-15 DIAGNOSIS — R6889 Other general symptoms and signs: Secondary | ICD-10-CM | POA: Diagnosis not present

## 2018-10-16 ENCOUNTER — Encounter: Payer: Self-pay | Admitting: Physical Therapy

## 2018-10-16 ENCOUNTER — Other Ambulatory Visit: Payer: Self-pay

## 2018-10-16 ENCOUNTER — Ambulatory Visit: Payer: Medicare Other | Attending: Family Medicine | Admitting: Physical Therapy

## 2018-10-16 DIAGNOSIS — M6281 Muscle weakness (generalized): Secondary | ICD-10-CM | POA: Diagnosis not present

## 2018-10-16 DIAGNOSIS — M6249 Contracture of muscle, multiple sites: Secondary | ICD-10-CM | POA: Insufficient documentation

## 2018-10-16 DIAGNOSIS — R293 Abnormal posture: Secondary | ICD-10-CM | POA: Diagnosis not present

## 2018-10-16 DIAGNOSIS — R262 Difficulty in walking, not elsewhere classified: Secondary | ICD-10-CM | POA: Diagnosis not present

## 2018-10-16 DIAGNOSIS — R6889 Other general symptoms and signs: Secondary | ICD-10-CM | POA: Diagnosis not present

## 2018-10-16 DIAGNOSIS — R29818 Other symptoms and signs involving the nervous system: Secondary | ICD-10-CM | POA: Insufficient documentation

## 2018-10-16 NOTE — Therapy (Signed)
[facility] [address] [place], [place], [address] Phone: [phone]   Fax:  [phone]  Physical Therapy Evaluation  Patient Details  Name: Kurt Cross MRN: [id number] Date of Birth: 16-Feb-1951 Referring Provider (PT): [name], MD   Encounter Date: 10/16/2018  PT End of Session - 10/16/18 1520    Visit Number  1    Number of Visits  11    Date for PT Re-Evaluation  11/15/18    Authorization Type  [employer] Medicare $35 copay VL:[place]    Authorization Time Period  10/16/18 to 01/14/2019    PT Start Time  1405    PT Stop Time  1500   note pt working on his schedule while PT made HEP handout   PT Time Calculation (min)  55 min    Activity Tolerance  Patient tolerated treatment well    Behavior During Therapy  [facility] for tasks assessed/performed       Past Medical History:  Diagnosis Date  . AAA (abdominal aortic aneurysm)/ 4.5 cm ascending per CT angio 08/01/13 08/12/2013  . Atrial fibrillation ([place])   . Benign hypertension   . Cellulitis   . CHF (congestive heart failure), NYHA class II ([place])   . Chronic respiratory failure ([place]) 02/10/2016  . Complete heart block ([facility])    s/p PPM Bi-Ven  . Constipation, chronic 08/03/2013  . Debility 02/10/2016  . Diabetes mellitus, type 2 ([place])   . Hernia of abdominal cavity   . Hyperlipidemia   . Hypertension   . Hypokalemia   . Hypoxemia   . 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 Kidney failure   . Neuropathy   . OSA (obstructive sleep apnea)     Past Surgical History:  Procedure Laterality Date  . APPENDECTOMY    . biventricular pacemaker generator change  04/29/2014   MDT Viva CRT-P BiV pacemaker implanted by Dr Rolla Etienne in [place]  . biventricular pacemaker implantation  06/18/2007   MDT BIV pacemaker implanted by Dr Lovena Le  . BOWEL RESECTION  1980s?   ?colectomy/ostomy - Dr Elesa Hacker  . INCISION AND  DRAINAGE ABSCESS N/A 08/14/2013   Procedure: INCISION AND DRAINAGE ABSCESS, mesh ;  Surgeon: [name], MD;  Location: [facility];  Service: General;  Laterality: N/A;  . [place]?   Dr. Lindon Romp - w mesh  . KNEE SURGERY Right   . LEFT HEART CATHETERIZATION WITH CORONARY ANGIOGRAM N/A 03/17/2014   Procedure: LEFT HEART CATHETERIZATION WITH CORONARY ANGIOGRAM;  Surgeon: [name], MD;  Location: [facility] CATH LAB;  Service: Cardiovascular;  Laterality: N/A;  . NASAL SEPTUM SURGERY    . OSTOMY TAKE DOWN  1980s?   Dr Elesa Hacker  . TONSILLECTOMY AND ADENOIDECTOMY      There were no vitals filed for this visit.   Subjective Assessment - 10/16/18 1407    Subjective  Had HHPT 2x/week for 8 weeks. Moving my legs a little better. I've been practicing trying to stand if I'm holding onto the kitchen sink. Uses sliding board for bed <> w/c. Can move from supine to sit EOB now where he needed help before.     Pertinent History  DM, neuropathy, HTN, CHF EF 35%, complete heart block with PPM, afib on Coumadin, AAA, infected mesh of abdominal wall with removal    Patient Stated Goals  be able to stand and walk; at least be able  to get into and out of the car and drive; be able to attend son's games (38 yo son)    Currently in Pain?  Yes    Pain Location  Leg    Pain Orientation  Lower;Right;Left    Pain Descriptors / Indicators  Aching;Numbness   without gabapentin has burning pain   Pain Type  Chronic pain;Neuropathic pain    Pain Onset  More than a month ago    Pain Frequency  Constant    Aggravating Factors   none    Pain Relieving Factors  gabapentin         [facility] PT Assessment - 10/16/18 1419      Assessment   Medical Diagnosis  Debility    Referring Provider (PT)  [name], MD    Onset Date/Surgical Date  --   MD referral 09/17/18   Prior Therapy  SNF, HH x 8 weeks      Precautions   Precautions  Fall      Balance Screen   Has the patient fallen  in the past 6 months  No    Has the patient had a decrease in activity level because of a fear of falling?   No    Is the patient reluctant to leave their home because of a fear of falling?   No      Home Environment   Living Environment  Private residence    Living Arrangements  Spouse/significant other;Children   43 yo son   Available Help at Discharge  Family;Available PRN/intermittently   daughter that freq assists is pregnant with twins & limited   Home Equipment  Wheelchair - manual   sliding board; full assessment not completed     Prior Function   Level of Independence  Needs assistance with ADLs;Independent with household mobility with device   using w/c for independence; fits into all rooms     Cognition   Overall Cognitive Status  Within Functional Limits for tasks assessed      Observation/Other Assessments   Observations  morbidly obese arrives via medical transport in w/c     Skin Integrity  pt reports all wounds now healed (had been on left lateral foot and rt ischium)      Observation/Other Assessments-Edema    Edema  --   bil LEs      Sensation   Light Touch  Impaired by gross assessment   reports numbness bil lower legs     Posture/Postural Control   Posture/Postural Control  Postural limitations    Postural Limitations  Forward head;Rounded Shoulders;Decreased lumbar lordosis;Posterior pelvic tilt      ROM / Strength   AROM / PROM / Strength  PROM;Strength      PROM   Overall PROM   Deficits    PROM Assessment Site  Knee    Right/Left Knee  Right;Left    Right Knee Extension  -38    Left Knee Extension  -44      Strength   Overall Strength  Deficits    Overall Strength Comments  bil UEs 5/5 throughout; deficits bil LEs     Strength Assessment Site  Hip;Knee;Ankle   all testing done in pt's w/c   Right/Left Hip  Right;Left    Right Hip Flexion  3-/5    Left Hip Flexion  3/5    Right/Left Knee  Right;Left    Right Knee Extension  3-/5   partly  limited by knee flexion contracture  Left Knee Extension  3-/5   partly limited by knee flexion contracture   Right/Left Ankle  Right;Left    Right Ankle Dorsiflexion  3/5    Left Ankle Dorsiflexion  3-/5      Transfers   Comments  pt reports does sliding board transfers (but not into car--uses medical transport to come to appts)      Ambulation/Gait   Gait Comments  reports last walked in May 2019 prior to hospitalization (informed MD notes states he was non-ambulatory x 9 months prior to hospital, however pt denies this)      Administrator, arts  Both upper extremities    Wheelchair Parts Management  Supervision/cueing;Needs assistance   manages brakes I'ly; assist to find lever for foot rests;               Objective measurements completed on examination: See above findings.              PT Education - 10/16/18 1514    Education Details  must work on knee flexion contractures (gaining knee extension ROM and strength) to have any realistic chance of walking. Will require his diligence with HEP (especially if can only financially afford to come 1x/week); discussed PT POC; intiiated HEP for LEs     Person(s) Educated  Patient    Methods  Explanation;Handout    Comprehension  Verbalized understanding;Need further instruction       PT Short Term Goals - 10/16/18 1948      PT SHORT TERM GOAL #1   Title  Patient will be independent (or independently able to direct family member how to assist him) with basic HEP (Target all STGs 11/15/2018)    Time  4    Period  Weeks    Status  New    Target Date  11/15/18      PT SHORT TERM GOAL #2   Title  Patient will improve bil knee flexion contractures by 8 degrees to improve ability to stand fully upright for pre-gait.     Time  4    Period  Weeks    Status  New      PT SHORT TERM GOAL #3   Title  Patient will be able to propel w/c using bil LEs (with only bil UE  assist during turns) x 100 ft.     Time  4    Period  Weeks      PT SHORT TERM GOAL #4   Title  Patient will stand in // bars with +2 assist (pt = 70%) x 30 seconds    Time  4    Period  Weeks    Status  New        PT Long Term Goals - 10/16/18 1957      PT LONG TERM GOAL #1   Title  Patient will be independent (or independently able to direct family member how to assist him) in updated HEP (Target all LTGs 12/15/2018)    Time  8    Period  Weeks    Status  New    Target Date  12/15/18      PT LONG TERM GOAL #2   Title  Patient will improve bil knee flexion contractures by 8 degrees compared to measure at 4 weeks    Time  8    Period  Weeks    Status  New  PT LONG TERM GOAL #3   Title  Patient will propel w/c with bil LEs x 150 ft with no use of UEs.     Time  8    Period  Weeks    Status  New      PT LONG TERM GOAL #4   Title  Patient will stand in // bars with 1 person min assist and able to perform pre-gait activities x 60 seconds    Time  8    Period  Weeks    Status  New             Plan - 10/16/18 1524    Clinical Impression Statement  Patient referred to OPPT due to debility after multiple medical conditions, surgeries, and complications. He completed 8 weeks of HHPT and reports progress with his bed mobility and bil LE strength. Patient's Karren Burly is limited due to obesity, bil knee flexion contractures, weakness, and decr sensation. Patient appears very motivated as he discusses wanting to be able to attend his 20 yr old son's games. Patient educated on importance of compliance with HEP to achieve his goals. Overall, anticipate patient can make progress towards goals via the interventions listed below to address the deficits listed below.     History and Personal Factors relevant to plan of care:  PMH-DM, neuropathy, HTN, CHF EF 35%, complete heart block with PPM, afib on Coumadin, AAA, infected mesh of abdominal wall with removal. Personal  factors-requires medical transport home to OPPT, limited support in home environment,     Clinical Presentation  Stable    Clinical Decision Making  Low    Rehab Potential  Fair    Clinical Impairments Affecting Rehab Potential  CHF with EF 35% limiting activity tolerance    PT Frequency  Other (comment)   2x/week x 2 weeks; 1x/week x 6 weeks   PT Duration  8 weeks    PT Treatment/Interventions  ADLs/Self Care Home Management;DME Instruction;Gait training;Functional mobility training;Therapeutic activities;Therapeutic exercise;Balance training;Orthotic Fit/Training;Patient/family education;Wheelchair mobility training;Neuromuscular re-education;Manual techniques;Energy conservation;Passive range of motion    PT Next Visit Plan  check technique for HEP issued on eval (pt did not get to demonstrate) and make additions; anticipate will need +2 for w/c<>mat sliding board transfer to get on mat table and try bridging or for attempting standing    PT Home Exercise Plan  [id number]    Consulted and Agree with Plan of Care  Patient       Patient will benefit from skilled therapeutic intervention in order to improve the following deficits and impairments:  Decreased endurance, Decreased knowledge of use of DME, Decreased mobility, Decreased range of motion, Difficulty walking, Decreased strength, Increased edema, Impaired flexibility, Increased fascial restricitons, Impaired sensation, Obesity, Postural dysfunction, Pain  Visit Diagnosis: Muscle weakness (generalized) - Plan: PT plan of care cert/re-cert  Abnormal posture - Plan: PT plan of care cert/re-cert  Contracture of muscle, multiple sites - Plan: PT plan of care cert/re-cert  Difficulty in walking, not elsewhere classified - Plan: PT plan of care cert/re-cert  Other symptoms and signs involving the nervous system - Plan: PT plan of care cert/re-cert     Problem List Patient Active Problem List   Diagnosis Date Noted  . CKD (chronic  kidney disease), stage III ([place]) 02/17/2018  . S/P placement of cardiac pacemaker 02/22/2016  . Atrial flutter ([place]) 02/22/2016  . Chronic anticoagulation 02/22/2016  . H/O medication noncompliance 02/22/2016  . Debility 02/10/2016  . Constipation 02/07/2016  .  Acute on chronic combined systolic and diastolic CHF, NYHA class 2 ([place]) 01/27/2016  . Acute on chronic renal failure ([place]) 01/27/2016  . Acute on chronic respiratory failure with hypoxia ([place]) 01/27/2016  . Hypoxia 01/26/2016  . Atrial fibrillation/flutter 10/06/2013  . 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 Recurrent ventral incisional hernia 08/14/2013  . AAA (abdominal aortic aneurysm)/ 4.5 cm ascending per CT angio 08/01/13 08/12/2013  . Right leg pain 08/11/2013  . Chronic combined systolic and diastolic CHF (congestive heart failure) ([place]) 08/04/2013  . Essential hypertension 06/28/2012  . Diabetes mellitus, insulin dependent (IDDM), uncontrolled ([place]) 09/01/2007  . Hyperlipidemia 09/01/2007  . Obesity, Class III, BMI 40-49.9 (morbid obesity) ([place]) 09/01/2007  . Obstructive sleep apnea 09/01/2007    Rexanne Mano, PT 10/16/2018, 8:12 PM  [facility] [address] [place], [place], [address] Phone: [phone]   Fax:  [phone]  Name: Kurt Cross MRN: [id number] Date of Birth: [birth year]

## 2018-10-16 NOTE — Patient Instructions (Signed)
Access Code: [email or web address]  URL: [email or web address]  Date: 10/16/2018  Prepared by: Barry Brunner   Exercises  Seated Single Leg Hamstring Stretch with AFO - Wheelchair - 3 reps - 1 sets - 30 hold - 3x daily - 7x weekly  Seated Hamstring Stretch with Chair - 3 reps - 1 sets - 30 seconds hold - 3x daily - 7x weekly  Seated Long Arc Quad with Strap - 02-1009 reps - 1 sets - - hold - 1-2x daily - 5x weekly  Seated March - 10-20 reps - 1 sets - - hold - 1-2x daily - 5x weekly  Supine Bridge - 5-10 reps - 1 sets - - hold - 1-2x daily - 5x weekly  *Some exercises pt described that he is doing from HHPT: some PT demonstrated for pt due to lack of time. Did not discuss or demonstrate bridging.

## 2018-10-22 ENCOUNTER — Ambulatory Visit: Payer: Medicare Other | Admitting: Physical Therapy

## 2018-10-23 DIAGNOSIS — R6889 Other general symptoms and signs: Secondary | ICD-10-CM | POA: Diagnosis not present

## 2018-10-27 ENCOUNTER — Ambulatory Visit: Payer: Medicare Other | Admitting: Physical Therapy

## 2018-10-29 ENCOUNTER — Encounter: Payer: Self-pay | Admitting: Physical Therapy

## 2018-10-29 ENCOUNTER — Ambulatory Visit: Payer: Medicare Other | Admitting: Physical Therapy

## 2018-10-29 NOTE — Therapy (Signed)
[facility] [address] [place], [place], [address] Phone: [phone]   Fax:  [phone]  Patient Details  Name: Kurt Cross MRN: [id number] Date of Birth: 03-08-1951 Referring Provider:  No ref. provider found  Encounter Date: 10/29/2018  Patient was a no show for his 10/22/18, 10/27/18 and today's (1/23) appointments.  I contacted the patient on 10/27/18 to discuss if he planned to continue with PT and he reported each time he had issues scheduling transportation. On 10/27/18, he reported he planned to attend his 10/29/18 appointment  When he did not show up for today's appointment, I again called him and he reports another transportation issue. He did not call to schedule 3 days prior to his appointment.   He reports he really wants to attend PT (in fact he reports he pre-paid for 4 visits when he was here for his evaluation).   We discussed that his next appointment is one week away (11/05/18) and if he does not attend, we will need to cancel his remaining appts. He verbalized understanding.      Rexanne Mano, PT 10/29/2018, 2:37 PM  [facility] [address] [place], [place], [address] Phone: [phone]   Fax:  [phone]

## 2018-11-02 DIAGNOSIS — Z79899 Other long term (current) drug therapy: Secondary | ICD-10-CM | POA: Diagnosis not present

## 2018-11-02 DIAGNOSIS — G894 Chronic pain syndrome: Secondary | ICD-10-CM | POA: Diagnosis not present

## 2018-11-02 DIAGNOSIS — Z79891 Long term (current) use of opiate analgesic: Secondary | ICD-10-CM | POA: Diagnosis not present

## 2018-11-02 DIAGNOSIS — R29898 Other symptoms and signs involving the musculoskeletal system: Secondary | ICD-10-CM | POA: Diagnosis not present

## 2018-11-02 DIAGNOSIS — M47817 Spondylosis without myelopathy or radiculopathy, lumbosacral region: Secondary | ICD-10-CM | POA: Diagnosis not present

## 2018-11-05 ENCOUNTER — Encounter: Payer: Self-pay | Admitting: Physical Therapy

## 2018-11-05 ENCOUNTER — Ambulatory Visit: Payer: Medicare Other | Admitting: Physical Therapy

## 2018-11-05 NOTE — Therapy (Unsigned)
[facility] [address] [place], [place], [address] Phone: [phone]   Fax:  [phone]  Patient Details  Name: Kurt Cross MRN: [id number] Date of Birth: [birth year] Referring Provider:  No ref. provider found  Encounter Date: 11/05/2018   Patient again did not show for his PT appointment. See PT note of 10/29/18 regarding plan to cancel his remaining appointments and discharge from PT if he did not come to today's appointment.   Called patient with voicemail left. Patient referred to Account Information [phone] regarding his pre-payment for PT appointments (to discuss how to get a refund).   PHYSICAL THERAPY DISCHARGE SUMMARY  Visits from Start of Care: 1  Current functional level related to goals / functional outcomes: Unable to assess as did not return after evaluation   Remaining deficits: Unable to assess as did not return after evaluation   Education / Equipment: Unable to assess as did not return after evaluation  Plan: Patient agrees to discharge.  Patient goals were not met. Patient is being discharged due to not returning since the last visit.  ?????        Rexanne Mano, PT 11/05/2018, 4:43 PM  [facility] [address] [place], [place], [address] Phone: [phone]   Fax:  [phone]

## 2018-11-10 ENCOUNTER — Ambulatory Visit: Payer: Medicare Other | Admitting: Physician Assistant

## 2018-11-11 ENCOUNTER — Emergency Department (HOSPITAL_COMMUNITY)
Admission: EM | Admit: 2018-11-11 | Discharge: 2018-11-12 | Disposition: A | Discharge status: Home / Self Care | Payer: Medicare Other | Attending: Emergency Medicine | Admitting: Emergency Medicine

## 2018-11-11 ENCOUNTER — Encounter (HOSPITAL_COMMUNITY): Payer: Self-pay | Admitting: Emergency Medicine

## 2018-11-11 ENCOUNTER — Encounter: Payer: Self-pay | Admitting: Physician Assistant

## 2018-11-11 DIAGNOSIS — E161 Other hypoglycemia: Secondary | ICD-10-CM | POA: Diagnosis not present

## 2018-11-11 DIAGNOSIS — E1122 Type 2 diabetes mellitus with diabetic chronic kidney disease: Secondary | ICD-10-CM | POA: Insufficient documentation

## 2018-11-11 DIAGNOSIS — W06XXXA Fall from bed, initial encounter: Secondary | ICD-10-CM | POA: Insufficient documentation

## 2018-11-11 DIAGNOSIS — Y929 Unspecified place or not applicable: Secondary | ICD-10-CM | POA: Diagnosis not present

## 2018-11-11 DIAGNOSIS — R0902 Hypoxemia: Secondary | ICD-10-CM | POA: Diagnosis not present

## 2018-11-11 DIAGNOSIS — E11649 Type 2 diabetes mellitus with hypoglycemia without coma: Secondary | ICD-10-CM | POA: Insufficient documentation

## 2018-11-11 DIAGNOSIS — E1165 Type 2 diabetes mellitus with hyperglycemia: Secondary | ICD-10-CM | POA: Diagnosis not present

## 2018-11-11 DIAGNOSIS — I130 Hypertensive heart and chronic kidney disease with heart failure and stage 1 through stage 4 chronic kidney disease, or unspecified chronic kidney disease: Secondary | ICD-10-CM | POA: Insufficient documentation

## 2018-11-11 DIAGNOSIS — R4182 Altered mental status, unspecified: Secondary | ICD-10-CM | POA: Diagnosis present

## 2018-11-11 DIAGNOSIS — N183 Chronic kidney disease, stage 3 (moderate): Secondary | ICD-10-CM | POA: Diagnosis not present

## 2018-11-11 DIAGNOSIS — Z87891 Personal history of nicotine dependence: Secondary | ICD-10-CM | POA: Insufficient documentation

## 2018-11-11 DIAGNOSIS — Z794 Long term (current) use of insulin: Secondary | ICD-10-CM | POA: Diagnosis not present

## 2018-11-11 DIAGNOSIS — Y9389 Activity, other specified: Secondary | ICD-10-CM | POA: Insufficient documentation

## 2018-11-11 DIAGNOSIS — R410 Disorientation, unspecified: Secondary | ICD-10-CM | POA: Diagnosis not present

## 2018-11-11 DIAGNOSIS — Z79899 Other long term (current) drug therapy: Secondary | ICD-10-CM | POA: Insufficient documentation

## 2018-11-11 DIAGNOSIS — Y999 Unspecified external cause status: Secondary | ICD-10-CM | POA: Insufficient documentation

## 2018-11-11 DIAGNOSIS — I5042 Chronic combined systolic (congestive) and diastolic (congestive) heart failure: Secondary | ICD-10-CM | POA: Insufficient documentation

## 2018-11-11 DIAGNOSIS — E162 Hypoglycemia, unspecified: Secondary | ICD-10-CM | POA: Diagnosis not present

## 2018-11-11 DIAGNOSIS — R404 Transient alteration of awareness: Secondary | ICD-10-CM | POA: Diagnosis not present

## 2018-11-11 DIAGNOSIS — W19XXXA Unspecified fall, initial encounter: Secondary | ICD-10-CM

## 2018-11-11 LAB — CBG MONITORING, ED
GLUCOSE-CAPILLARY: 62 mg/dL — AB (ref 70–99)
GLUCOSE-CAPILLARY: 185 mg/dL — AB (ref 70–99)
Glucose-Capillary: 83 mg/dL (ref 70–99)

## 2018-11-11 MED ORDER — OXYCODONE-ACETAMINOPHEN 5-325 MG PO TABS
2.0000 | ORAL_TABLET | Freq: Once | ORAL | Status: AC
  Administered 2018-11-11: 2 via ORAL
  Filled 2018-11-11: qty 2

## 2018-11-11 NOTE — ED Triage Notes (Signed)
-  Arrived via EMS for a fall, patient reports L knee pain, seems to be chronic pain. Wife said he has been altered throughout the day, A&O x3 with EMS initally, patient started slurring speech, sugar was found to be 49 -20 LFA, 25 g D10, A&ox4, sugar after D10 was 109, reports not eating since AM and then taking insulin. -Hx of DM, pacemaker, CHF, AAA  Vitals  -BP 124/73 -P 78 -RR 16 -O2 80s RA, EMS put on 3L, not on O2 at home

## 2018-11-11 NOTE — Discharge Instructions (Addendum)
Make sure you are eating regularly, while you are taking your insulin.  Continue your usual medications and see your doctor as needed for problems.

## 2018-11-11 NOTE — ED Notes (Signed)
Patient given PO fluids and food.  Will check CBG after eating/ drinking.

## 2018-11-11 NOTE — ED Notes (Signed)
Bed: [facility] Expected date:  Expected time:  Means of arrival:  Comments: EMS fall 

## 2018-11-11 NOTE — ED Provider Notes (Signed)
[facility] DEPT Provider Note   CSN: [id number] Arrival date & time: 11/11/18  2031     History   Chief Complaint Chief Complaint  Patient presents with  . Fall    HPI Kurt Cross is a 68 y.o. male.  HPI   Patient presents by EMS for evaluation of injuries from fall.  Transferred by EMS who found him to be altered, and hypoglycemic at 49.  He was treated with IV glucose with improvement of CBG to 109.  Patient states he was lying in his bed, as usual, and was trying to get something but did not have prescriber, so he fell onto the floor.  He states he had not eaten his supper yet.  Currently is hungry.  Patient does not feel he injured anything in the fall to the floor from his bed.  Apparently EMS found him on the floor and transferred him here, from that position.  Patient states he has not walked in a year, and has to get around using a wheelchair.  He is in the process of getting further evaluation for his chronic leg weakness.  He denies recent fever, chills, cough, shortness of breath, focal weakness or paresthesia.  There are no other known modifying factors.  Past Medical History:  Diagnosis Date  . AAA (abdominal aortic aneurysm)/ 4.5 cm ascending per CT angio 08/01/13 08/12/2013  . Atrial fibrillation ([place])   . Benign hypertension   . Cellulitis   . CHF (congestive heart failure), NYHA class II ([place])   . Chronic respiratory failure ([place]) 02/10/2016  . Complete heart block (HCC)    s/p PPM Bi-Ven  . Constipation, chronic 08/03/2013  . Debility 02/10/2016  . Diabetes mellitus, type 2 ([place])   . Hernia of abdominal cavity   . Hyperlipidemia   . Hypertension   . Hypokalemia   . Hypoxemia   . 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 Kidney failure   . Neuropathy   . OSA (obstructive sleep apnea)     Patient Active Problem List   Diagnosis Date Noted  . CKD (chronic kidney disease),  stage III ([place]) 02/17/2018  . S/P placement of cardiac pacemaker 02/22/2016  . Atrial flutter ([place]) 02/22/2016  . Chronic anticoagulation 02/22/2016  . H/O medication noncompliance 02/22/2016  . Debility 02/10/2016  . Constipation 02/07/2016  . Acute on chronic combined systolic and diastolic CHF, NYHA class 2 ([place]) 01/27/2016  . Acute on chronic renal failure ([place]) 01/27/2016  . Acute on chronic respiratory failure with hypoxia ([place]) 01/27/2016  . Hypoxia 01/26/2016  . Atrial fibrillation/flutter 10/06/2013  . 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 Recurrent ventral incisional hernia 08/14/2013  . AAA (abdominal aortic aneurysm)/ 4.5 cm ascending per CT angio 08/01/13 08/12/2013  . Right leg pain 08/11/2013  . Chronic combined systolic and diastolic CHF (congestive heart failure) ([place]) 08/04/2013  . Essential hypertension 06/28/2012  . Diabetes mellitus, insulin dependent (IDDM), uncontrolled ([place]) 09/01/2007  . Hyperlipidemia 09/01/2007  . Obesity, Class III, BMI 40-49.9 (morbid obesity) ([place]) 09/01/2007  . Obstructive sleep apnea 09/01/2007    Past Surgical History:  Procedure Laterality Date  . APPENDECTOMY    . biventricular pacemaker generator change  04/29/2014   MDT Viva CRT-P BiV pacemaker implanted by Dr Rolla Etienne in [place]  . biventricular pacemaker implantation  06/18/2007   MDT BIV pacemaker implanted by Dr Lovena Le  . BOWEL RESECTION  1980s?   ?  colectomy/ostomy - Dr Elesa Hacker  . INCISION AND DRAINAGE ABSCESS N/A 08/14/2013   Procedure: INCISION AND DRAINAGE ABSCESS, mesh ;  Surgeon: [name], MD;  Location: [facility]S;  Service: General;  Laterality: N/A;  . [place]?   Dr. Lindon Romp - w mesh  . KNEE SURGERY Right   . LEFT HEART CATHETERIZATION WITH CORONARY ANGIOGRAM N/A 03/17/2014   Procedure: LEFT HEART CATHETERIZATION WITH CORONARY ANGIOGRAM;  Surgeon: [name], MD;  Location: [facility] CATH LAB;  Service: Cardiovascular;   Laterality: N/A;  . NASAL SEPTUM SURGERY    . OSTOMY TAKE DOWN  1980s?   Dr Elesa Hacker  . TONSILLECTOMY AND ADENOIDECTOMY          Home Medications    Prior to Admission medications   Medication Sig Start Date End Date Taking? Authorizing Provider  Blood Glucose Monitoring Suppl (ACCU-CHEK AVIVA) device Use as instructed 08/25/18 08/25/19  [name], MD  carvedilol (COREG) 25 MG tablet Take 25 mg by mouth 2 (two) times daily. 07/25/14   [provider]  cyclobenzaprine (FLEXERIL) 5 MG tablet Take 5 mg by mouth 3 (three) times daily.    [provider]  furosemide (LASIX) 80 MG tablet Take 80 mg by mouth daily.     [provider]  gabapentin (NEURONTIN) 300 MG capsule Take 300 mg by mouth every morning. 10/17/17   [provider]  insulin NPH Human (HUMULIN N,NOVOLIN N) 100 UNIT/ML injection Inject 20 units in the morning and 20 units in the evening.    [provider]  insulin regular (NOVOLIN R,HUMULIN R) 100 units/mL injection Use with sliding scale.    [provider]  KLOR-CON M20 20 MEQ tablet Take 20 mg by mouth 2 (two) times daily. 01/06/18   [provider]  metolazone (ZAROXOLYN) 2.5 MG tablet Take 2.5 mg by mouth daily.    [provider]  oxyCODONE-acetaminophen (PERCOCET) 10-325 MG tablet  08/24/18   [provider]  rosuvastatin (CRESTOR) 10 MG tablet Take 10 mg by mouth daily.    [provider]  spironolactone (ALDACTONE) 25 MG tablet Take 1 tablet (25 mg total) by mouth 2 (two) times daily. 09/09/18   [name], MD  traZODone (DESYREL) 50 MG tablet TAKE 2 TABLETS BY MOUTH ONCE DAILY AT BEDTIME AS NEEDED 06/29/18   [provider]    Family History Family History  Problem Relation Age of Onset  . Diabetes Mother   . Alcoholism Father   . Heart Problems Maternal Grandmother     Social History Social History   Tobacco Use  . Smoking status:  Former Smoker    Packs/day: 1.00    Years: 40.00    Pack years: 40.00    Types: Cigarettes    Last attempt to quit: 08/21/2012    Years since quitting: 6.2  . Smokeless tobacco: Never Used  Substance Use Topics  . Alcohol use: Yes    Alcohol/week: 0.0 standard drinks    Comment: occasional  . Drug use: No     Allergies   Patient has no known allergies.   Review of Systems Review of Systems  All other systems reviewed and are negative.    Physical Exam Updated Vital Signs BP 133/77 (BP Location: Left Arm)   Pulse 69   Temp (!) 97.4 F (36.3 C) (Oral)   Resp 16   SpO2 98%   Physical Exam Vitals signs and nursing note reviewed.  Constitutional:      Appearance: He is well-developed. He is obese. He is not ill-appearing.  HENT:     Head: Normocephalic and atraumatic.     Right Ear: External ear normal.     Left Ear: External ear normal.  Eyes:     Conjunctiva/sclera: Conjunctivae normal.     Pupils: Pupils are equal, round, and reactive to light.  Neck:     Musculoskeletal: Normal range of motion and neck supple.     Trachea: Phonation normal.  Cardiovascular:     Rate and Rhythm: Normal rate and regular rhythm.     Heart sounds: Normal heart sounds.  Pulmonary:     Effort: Pulmonary effort is normal.     Breath sounds: Normal breath sounds.  Abdominal:     Palpations: Abdomen is soft.     Tenderness: There is no abdominal tenderness.  Musculoskeletal:     Comments: Bilateral leg weakness, he has difficulty extending the knees from the position of comfort at about 45 degrees flexion.  Distal sensation in the, bilaterally.  Skin:    General: Skin is warm and dry.  Neurological:     Mental Status: He is alert and oriented to person, place, and time.     Cranial Nerves: No cranial nerve deficit.     Sensory: No sensory deficit.     Motor: No abnormal muscle tone.     Coordination: Coordination normal.  Psychiatric:        Behavior: Behavior normal.         Thought Content: Thought content normal.        Judgment: Judgment normal.      ED Treatments / Results  Labs (all labs ordered are listed, but only abnormal results are displayed) Labs Reviewed  CBG MONITORING, ED - Abnormal; Notable for the following components:      Result Value   Glucose-Capillary 62 (*)    All other components within normal limits  CBG MONITORING, ED - Abnormal; Notable for the following components:   Glucose-Capillary 185 (*)    All other components within normal limits  CBG MONITORING, ED    EKG None  Radiology No results found.  Procedures Procedures (including critical care time)  Medications Ordered in ED Medications  oxyCODONE-acetaminophen (PERCOCET/ROXICET) 5-325 MG per tablet 2 tablet (2 tablets Oral Given 11/11/18 2233)     Initial Impression / Assessment and Plan / ED Course  I have reviewed the triage vital signs and the nursing notes.  Pertinent labs & imaging results that were available during my care of the patient were reviewed by me and considered in my medical decision making (see chart for details).  Clinical Course as of Nov 11 2342  Wed Nov 11, 2018  2226 At this time the patient is alert, lucid and cooperative.  He is eating crackers and peanut butter, and has had several of each.   [EW]    Clinical Course User Index [EW] Daleen Bo, MD     Patient Vitals for the past 24 hrs:  BP Temp Temp src Pulse Resp SpO2  11/11/18 2054 133/77 (!) 97.4 F (36.3 C) Oral 69 16 98 %    11:45 PM Reevaluation with update and discussion. After initial assessment and treatment, an updated evaluation reveals patient is comfortable now and has no additional complaints.  Findings discussed with patient and all questions were answered. Daleen Bo   Medical Decision Making: Hypoglycemia, with fall.  No evidence for significant injuries.  No indication for further ED evaluation or hospitalization at this time.  CRITICAL  CARE-no Performed by: Daleen Bo  Nursing Notes Reviewed/ Care Coordinated Applicable Imaging Reviewed Interpretation of Laboratory Data incorporated into ED treatment  The patient appears reasonably screened and/or stabilized for discharge and I doubt any other medical condition or other [facility] requiring further screening, evaluation, or treatment in the ED at this time prior to discharge.  Plan: Home Medications-continue usual medications; Home Treatments-rest, fluids, diabetic diet; return here if the recommended treatment, does not improve the symptoms; Recommended follow up-PCP checkup as needed.   Final Clinical Impressions(s) / ED Diagnoses   Final diagnoses:  None    ED Discharge Orders    None       Daleen Bo, MD 11/11/18 [phone]

## 2018-11-11 NOTE — ED Notes (Addendum)
[facility] called for transport. Wife updated on POC.

## 2018-11-12 ENCOUNTER — Ambulatory Visit: Payer: Medicare Other | Admitting: Physical Therapy

## 2018-11-13 DIAGNOSIS — R279 Unspecified lack of coordination: Secondary | ICD-10-CM | POA: Diagnosis not present

## 2018-11-13 DIAGNOSIS — J9600 Acute respiratory failure, unspecified whether with hypoxia or hypercapnia: Secondary | ICD-10-CM | POA: Diagnosis not present

## 2018-11-13 DIAGNOSIS — Z743 Need for continuous supervision: Secondary | ICD-10-CM | POA: Diagnosis not present

## 2018-11-13 DIAGNOSIS — E162 Hypoglycemia, unspecified: Secondary | ICD-10-CM | POA: Diagnosis not present

## 2018-11-13 DIAGNOSIS — I10 Essential (primary) hypertension: Secondary | ICD-10-CM | POA: Diagnosis not present

## 2018-11-16 ENCOUNTER — Other Ambulatory Visit: Payer: Self-pay

## 2018-11-16 NOTE — Patient Outreach (Signed)
[facility]) Care Management  11/16/2018  Kurt Cross [birth year] [id number]   Telephone Screen  Referral Date: 11/16/2018 Referral Source: [facility] Dept. Referral Reason: " [employer] member wheelchair bound due to chronic leg weakness, wants to go to [facility] for [facility] program but [facility] does not cover, member feels he needs [facility] assistance and med co pay assistance, has no appetite so not taking insulin" Insurance: [employer]   Outreach attempt #1 to patient. Spoke with patient. He resides in his home along with spouse. Patient reports he has been wheelchair bound and unable to walk for about a year. He voices that he was discharged from [facility] services about a month ago. Patient would like to get [facility] services back in place especially [facility] RN to come out and provide wound care. RN CM discussed [facility] services with patient and advised him that [facility] is not a [place] agency and does not provide those kind of services. RN CM encouraged patient to discuss with MD the possible need to get [facility] RN back in place to assist him. He states he has MD appt on next week and will follow up regarding the matter. Patient reports he does not have Medicaid and income too high to qualify. He does not want to pay out of pocket for any in home support/care. He states that he uses transportation provided by [facility]. However, he has been trying to get transportation to [facility] to participate in [employer] program but was told he did not have benefit. RN CM encouraged patient to call [facility] customer service to inquire about what benefits he does and does not have and how to take advantage of benefit. He voiced understanding and will do so. He reports taking about 11 meds. He states that his insulins(Humulin & Novolin) are expensive as well as the blood thinner he takes. Patient unable to recall name of blood thinner. He denies currently being out of any meds. He voices that he is able to manage and keep track of his meds. Patient states  cbgs running in the 100s and he checks them 2x/day. Blood sugar this morning was 111. Patient reports that he is intentionally  not eating and cutting back as he wants to lose weight. RN CM provided education on how to decrease carbs/calories without experiencing hypoglycemic events. Patient denies any further hypoglycemic events since ED visit on 11/11/2018. He denies needing any other [facility] services right now except for pharmacy.   Plan: RN CM will send [facility] pharmacy referral for possible med assistance.   Enzo Montgomery, RN,BSN,CCM [place] Management Telephonic Care Management Coordinator Direct Phone: [phone] Toll Free: [phone] Fax: [phone]

## 2018-11-17 ENCOUNTER — Ambulatory Visit: Payer: Medicare Other | Admitting: Physical Therapy

## 2018-11-18 ENCOUNTER — Telehealth: Payer: Self-pay | Admitting: Pharmacist

## 2018-11-18 NOTE — Patient Outreach (Signed)
[facility]) Care Management  [place]   11/18/2018  Kurt Cross October 01, 1951 [id number]  Reason for referral: Medication Assistance with Eliquis, Humulin N & Humulin R  Referral source: [facility] RN Current insurance:[employer]  PMHx includes but not limited to:   CKD stage III ,CHF, Type 2 diabetes, hyperlipidemia, obesity, pace make placement, hypertension, Afib, chronic leg weakness, (uses wheelchair to ambulate).  Outreach:  Successful telephone call with patient.  HIPAA identifiers verified.   Subjective:  Patient is a 68 year old male with including but not limited to the medical conditions listed above. Patient reported having difficulty affording OTC insulin (Relion N &R). He visited the ED 11/11/2018 for hypoglycemia.   Objective: Lab Results  Component Value Date   CREATININE 2.57 (H) 08/14/2018   CREATININE 1.86 (H) 02/18/2018   CREATININE 1.68 (H) 02/17/2018    Lab Results  Component Value Date   HGBA1C 9.6 (H) 08/14/2018    Lipid Panel     Component Value Date/Time   CHOL 122 08/14/2018 1022   TRIG 109.0 08/14/2018 1022   HDL 34.60 (L) 08/14/2018 1022   CHOLHDL 4 08/14/2018 1022   VLDL 21.8 08/14/2018 1022   LDLCALC 65 08/14/2018 1022   LDLDIRECT 69.0 08/14/2018 1022    BP Readings from Last 3 Encounters:  11/12/18 (!) 142/76  09/17/18 102/64  08/25/18 128/70    No Known Allergies  Medications Reviewed Today    Reviewed by Elayne Guerin, [place] (Pharmacist) on 11/18/18 at 1145  Med List Status: <None>  Medication Order Taking? Sig Documenting Provider Last Dose Status Informant  Blood Glucose Monitoring Suppl (ACCU-CHEK AVIVA) device [id number] Yes Use as instructed [name], MD Taking Active   carvedilol (COREG) 25 MG tablet [id number] Yes Take 25 mg by mouth 2 (two) times daily. [provider] Taking Active Multiple Informants           Med Note Jilda Roche A   Tue Oct 17, 2015  3:02 PM)     cyclobenzaprine (FLEXERIL) 5 MG tablet [id number] Yes Take 5 mg by mouth 3 (three) times daily. [provider] Taking Active Multiple Informants  furosemide (LASIX) 80 MG tablet [id number] Yes Take 80 mg by mouth daily.  [provider] Taking Active Multiple Informants  gabapentin (NEURONTIN) 300 MG capsule [id number] Yes Take 300 mg by mouth every morning. [provider] Taking Active Multiple Informants  insulin NPH Human (HUMULIN N,NOVOLIN N) 100 UNIT/ML injection [id number] Yes Inject 20 units in the morning and 20 units in the evening. [provider] Taking Active   insulin regular (NOVOLIN R,HUMULIN R) 100 units/mL injection [id number] Yes Use with sliding scale. [provider] Taking Active   KLOR-CON M20 20 MEQ tablet [id number] Yes Take 20 mg by mouth 2 (two) times daily. [provider] Taking Active Multiple Informants  metolazone (ZAROXOLYN) 2.5 MG tablet [id number] Yes Take 2.5 mg by mouth daily. [provider] Taking Active Multiple Informants  oxyCODONE-acetaminophen (PERCOCET) 10-325 MG tablet [id number] Yes Take 1 tablet by mouth every 6 (six) hours as needed.  [provider] Taking Active   rosuvastatin (CRESTOR) 10 MG tablet [id number] Yes Take 10 mg by mouth daily. [provider] Taking Active Multiple Informants  spironolactone (ALDACTONE) 25 MG tablet [id number] Yes Take 1 tablet (25 mg total) by mouth 2 (two) times daily. [name], MD Taking Active   traZODone (DESYREL) 50 MG tablet [id number] Yes TAKE 2 TABLETS BY MOUTH  ONCE DAILY AT BEDTIME AS NEEDED [provider] Taking Active   warfarin (COUMADIN) 7.5 MG tablet [id number] Yes Take 7.5 mg by mouth daily. [provider] Taking Active           Assessment:  Drugs sorted by system:  Neurologic/Psychologic: Gabapentin, Trazodone  Cardiovascular: Carvedilol, furosemide, Metolazone, Spironolactone,  Rosuvastatin, Warfarin  Endocrine: Humulin N, Humulin R  Pain: Cyclobenzaprine, Oxycodone/APAP  Vitamins/Minerals: [place] Con  Medication Review Findings:  Drug interactions:  Gabapentin/Oxycodone-APAP/Trazodone/Cyclobenzaprine--CNS depression, increased risk of accidental overdose  Other issues noted:  Narcan-patient reported only taking 1 oxycodone/APAP daily but with him also being on Gabapentin, cyclobenzaprine and trazodone, he is at increased risk for respiratory depression.  Having Narcan on hand may be prudent.  Diabetes Control Patient is using OTC Relion N & R insulin therapy. He is using the Relion R insulin via sliding scale. His regimen is understandable as he has had grave difficulty affording more expensive insulins in the past. However, if we are able to access [employer], Basaglar and Humalog may provide more glucose control.  Last HgA1c from 11/19 was 9.6%.    Medication Assistance Findings:  Medication assistance needs identified.   Extra Help:   _0  Already receiving Full Extra Help  _1  Already receiving Partial Extra Help  _2  Eligible based on reported income and assets  _3  Not Eligible based on reported income and assets  Patient Assistance Programs: 1) Engineer, agricultural and Humalog  made by [employer] o Income requirement met: _4  Yes _5  No _6  Unknown o Out-of-pocket prescription expenditure met:    _7  Yes _8  No  _9  Unknown  <MVHQIONGEXBMWUXL>_2<\/GMWNUUVOZDGUYQIH>_47  Not applicable Patient has met application requirements to apply for this patient assistance program.   (Relion N and R do not have a program. Patient's provider will be outreached about a therapeutic substitution if the patient is able to convince his wife to share her financial information.)  Patient asked that I call back later in the afternoon to explain to his wife about the necessary information. When I called back she was not there. He asked that I call back later.        2) Eliquis made by [place]  requirement met: _11  Yes _12  No  _13  Unknown o Out-of-pocket prescription expenditure met:   _14  Yes _15  No   _16  Unknown <[id number]>_5<\/JOACZYSAYTKZSWFU>_93  Not applicable - Patient has not met application requirements to apply for this patient assistance program at this time.   Additional medication assistance options reviewed with patient as warranted:  No other options identified  Plan: Will contact Patient's provider  regarding diabetes regimen and potential therapeutic substitution. , Will route note to PCP. , Will follow-up in 5-7 days

## 2018-11-19 ENCOUNTER — Ambulatory Visit: Payer: Medicare Other | Admitting: Physical Therapy

## 2018-11-19 NOTE — Telephone Encounter (Signed)
Agree with switch to basaglar.

## 2018-11-20 ENCOUNTER — Other Ambulatory Visit: Payer: Self-pay | Admitting: Pharmacist

## 2018-11-20 LAB — CUP PACEART INCLINIC DEVICE CHECK
Brady Statistic AP VP Percent: 0 %
Brady Statistic AP VS Percent: 0 %
Brady Statistic AS VP Percent: 85.61 %
Brady Statistic AS VS Percent: 14.39 %
Brady Statistic RA Percent Paced: 0 %
Brady Statistic RV Percent Paced: 88.28 %
Date Time Interrogation Session: 20191213030623
Implantable Lead Implant Date: 20080911
Implantable Lead Implant Date: 20080911
Implantable Lead Location: 753858
Implantable Lead Location: 753859
Implantable Lead Location: 753860
Implantable Lead Model: 4194
Implantable Lead Model: 5076
Implantable Pulse Generator Implant Date: 20150724
Lead Channel Impedance Value: 200 Ohm
Lead Channel Impedance Value: 342 Ohm
Lead Channel Impedance Value: 437 Ohm
Lead Channel Impedance Value: 456 Ohm
Lead Channel Impedance Value: 513 Ohm
Lead Channel Impedance Value: 513 Ohm
Lead Channel Impedance Value: 570 Ohm
Lead Channel Impedance Value: 627 Ohm
Lead Channel Pacing Threshold Amplitude: 0.75 V
Lead Channel Pacing Threshold Amplitude: 3 V
Lead Channel Pacing Threshold Pulse Width: 0.4 ms
Lead Channel Pacing Threshold Pulse Width: 1.5 ms
Lead Channel Sensing Intrinsic Amplitude: 6.25 mV
Lead Channel Setting Pacing Amplitude: 2 V
Lead Channel Setting Pacing Amplitude: 4 V
Lead Channel Setting Pacing Pulse Width: 0.4 ms
Lead Channel Setting Pacing Pulse Width: 1.5 ms
Lead Channel Setting Sensing Sensitivity: 0.9 mV
MDC IDC LEAD IMPLANT DT: 20080911
MDC IDC MSMT BATTERY REMAINING LONGEVITY: 11 mo
MDC IDC MSMT BATTERY VOLTAGE: 2.88 V
MDC IDC MSMT LEADCHNL LV IMPEDANCE VALUE: 684 Ohm
MDC IDC MSMT LEADCHNL RA SENSING INTR AMPL: 4.25 mV

## 2018-11-20 NOTE — Patient Outreach (Signed)
[facility]) Care Management  11/20/2018  Kurt Cross [birth year] [id number]   Patient was called to follow up on medication assistance. HIPAA identifiers were obtained. Patient said he spoke with his wife and she was/is unwilling to provide her financial information so that he can complete patient assistance applications.   Unfortunately, proof of household income is required to complete the patient assistance applications.  Dr. Ethelene Hal responded back to my message and agreed with the patient getting Basaglar but without the financial information, we will not be able to secure getting Basaglar at no cost from the [facility].  Due to not being able to provide financial documentation, patient will remain on OTC Relion N & R.  To further assist with medication savings, [facility] was called. The patient's account was updated and the representative reviewed the prices of the patient's prescriptions and said all of the patient's prescriptions would have a $0 copay for a 90 day supply: Carvedilol, cyclobenzaprine, furosemide, gabapentin, potassium Cl 20 MEQ, metolazone, rosuvastatin, spironolactone and warfarin.  (Patient had previously been paying $4 per prescription for a 30 day supply.)  Patient would continue to get Oxycodone/APAP and Relion Insulin from [facility].    Plan: Route note to patient's provider.to request new prescriptions to be sent to [facility].  Follow up with the patient in 1 week.  Elayne Guerin, PharmD, [place] Clinical Pharmacist [phone]

## 2018-11-24 ENCOUNTER — Ambulatory Visit: Payer: Medicare Other | Admitting: Physical Therapy

## 2018-11-24 ENCOUNTER — Ambulatory Visit: Payer: Medicare Other | Admitting: Family Medicine

## 2018-11-25 ENCOUNTER — Ambulatory Visit (INDEPENDENT_AMBULATORY_CARE_PROVIDER_SITE_OTHER): Payer: Medicare Other | Admitting: Nurse Practitioner

## 2018-11-25 DIAGNOSIS — R001 Bradycardia, unspecified: Secondary | ICD-10-CM

## 2018-11-25 DIAGNOSIS — Z950 Presence of cardiac pacemaker: Secondary | ICD-10-CM

## 2018-11-25 DIAGNOSIS — R6889 Other general symptoms and signs: Secondary | ICD-10-CM | POA: Diagnosis not present

## 2018-11-27 ENCOUNTER — Other Ambulatory Visit: Payer: Self-pay | Admitting: Pharmacist

## 2018-11-27 ENCOUNTER — Ambulatory Visit: Payer: Self-pay | Admitting: Pharmacist

## 2018-11-27 NOTE — Patient Outreach (Signed)
[facility]) Care Management  11/27/2018  DAMEN WINDSOR 1951/08/13 [id number]   Called patient to follow up on refills being sent to [facility]. HIPAA identifiers were obtained. Patient said she was not sure if medications got sent to [facility].  My note from 11/20/2018 was re-routed to Dr. Ethelene Hal.  Patient said he has an appointment next week and will ask Dr. Gerrit Friends to send new prescriptions to [facility] during his visit.  Plan: Call patient back in 2 weeks.   Elayne Guerin, PharmD, [place] Clinical Pharmacist [phone]

## 2018-12-01 ENCOUNTER — Ambulatory Visit: Payer: Medicare Other | Admitting: Physical Therapy

## 2018-12-03 ENCOUNTER — Encounter: Payer: Self-pay | Admitting: Family Medicine

## 2018-12-03 ENCOUNTER — Ambulatory Visit (INDEPENDENT_AMBULATORY_CARE_PROVIDER_SITE_OTHER): Payer: Medicare Other | Admitting: Family Medicine

## 2018-12-03 VITALS — BP 120/76 | HR 91 | Temp 98.4°F | Ht 72.0 in

## 2018-12-03 DIAGNOSIS — L97301 Non-pressure chronic ulcer of unspecified ankle limited to breakdown of skin: Secondary | ICD-10-CM

## 2018-12-03 DIAGNOSIS — E1165 Type 2 diabetes mellitus with hyperglycemia: Secondary | ICD-10-CM

## 2018-12-03 DIAGNOSIS — R5381 Other malaise: Secondary | ICD-10-CM | POA: Diagnosis not present

## 2018-12-03 DIAGNOSIS — N183 Chronic kidney disease, stage 3 unspecified: Secondary | ICD-10-CM

## 2018-12-03 DIAGNOSIS — IMO0001 Reserved for inherently not codable concepts without codable children: Secondary | ICD-10-CM

## 2018-12-03 DIAGNOSIS — E11622 Type 2 diabetes mellitus with other skin ulcer: Secondary | ICD-10-CM | POA: Diagnosis not present

## 2018-12-03 DIAGNOSIS — Z794 Long term (current) use of insulin: Secondary | ICD-10-CM

## 2018-12-03 DIAGNOSIS — R6889 Other general symptoms and signs: Secondary | ICD-10-CM | POA: Diagnosis not present

## 2018-12-03 LAB — HEMOGLOBIN A1C: Hgb A1c MFr Bld: 9.4 % — ABNORMAL HIGH (ref 4.6–6.5)

## 2018-12-03 LAB — BASIC METABOLIC PANEL
BUN: 49 mg/dL — ABNORMAL HIGH (ref 6–23)
CO2: 26 meq/L (ref 19–32)
CREATININE: 2.27 mg/dL — AB (ref 0.40–1.50)
Calcium: 8.6 mg/dL (ref 8.4–10.5)
Chloride: 94 mEq/L — ABNORMAL LOW (ref 96–112)
GFR: 35.17 mL/min — ABNORMAL LOW (ref >60.00)
Glucose, Bld: 352 mg/dL — ABNORMAL HIGH (ref 70–99)
Potassium: 4.6 mEq/L (ref 3.5–5.1)
Sodium: 131 mEq/L — ABNORMAL LOW (ref 135–145)

## 2018-12-03 NOTE — Progress Notes (Signed)
Established Patient Office Visit  Subjective:  Patient ID: Kurt Cross, male    DOB: Feb 05, 1951  Age: 68 y.o. MRN: [id number]  CC:  Chief Complaint  Patient presents with  . Follow-up    HPI Kurt Cross presents for patient has been working with a physical therapist to regain strength and mobility.  He was only able to afford a few visits.  He remains wheelchair-bound.  He is able to use the toilet by transferring from his chair to the toilet.  He is otherwise confined to his wheelchair.  The therapist had [facility] and [facility]ics as an alternative for him.  Patient needs help with dressing.  Patient has recently developed a recurrence of wound on his left heel that had previously healed.  He continues to use NPH insulin but did not take any this morning he tells me.  He was scheduled to see the ophthalmologist but was unable to go for the appointment.  Past Medical History:  Diagnosis Date  . AAA (abdominal aortic aneurysm)/ 4.5 cm ascending per CT angio 08/01/13 08/12/2013  . Abscess 02/13/2018  . Acute on chronic renal failure (HCC) 01/27/2016   02/15/16 Na 130, K 4.6, Bun 47, creat 2.07    . Acute on chronic respiratory failure ([place]) 01/27/2016  . Acute on chronic respiratory failure with hypoxia ([place]) 01/27/2016   02/14/16 CXR no acute pulmonary abnormality, a nodule density noted at the right upper lobe, could represent a granuloma versus neoplastic lesion, may CT   . Acute renal failure (ARF) ([place]) 08/15/2013  . AKI (acute kidney injury) ([place]) 11/12/2013  . Atrial fibrillation ([place])   . Atrial flutter ([place]) 02/22/2016  . Benign hypertension   . Bradycardia   . BRONCHITIS, CHRONIC 09/01/2007   Qualifier: Diagnosis of  By: Doy Mince LPN, Megan    . Cellulitis   . Cellulitis of left lower extremity 02/14/2018  . CHF (congestive heart failure), NYHA class II ([place])   . Chronic anticoagulation 02/22/2016  . Chronic combined systolic and  diastolic CHF (congestive heart failure) ([place]) 08/04/2013   02/14/16 CXR no acute pulmonary abnormality, a nodule density noted at the right upper lobe, could represent a granuloma versus neoplastic lesion, may CT   . Chronic respiratory failure ([place]) 02/10/2016  . Chronic ulcer of left heel ([place]) 02/17/2018  . CKD (chronic kidney disease), stage III ([place]) 02/17/2018  . Complete heart block (HCC)    s/p PPM Bi-Ven  . Constipation 02/07/2016  . Constipation, chronic 08/03/2013  . Debility 02/10/2016  . Decubitus ulcer of sacral region, stage 3 ([place]) 02/14/2018  . Diabetes mellitus, type 2 ([place])   . Elevated troponin 01/27/2016  . Fever 08/01/2013  . H/O medication noncompliance 02/22/2016  . Hernia of abdominal cavity   . Hyperkalemia 11/12/2013  . Hyperlipidemia   . Hypertension   . Hypokalemia   . Hypoxemia   . Hypoxia 01/26/2016  . 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 Kidney failure   . Leukocytosis 08/11/2013  . Neuropathy   . Obesity, Class III, BMI 40-49.9 (morbid obesity) ([place]) 09/01/2007   Qualifier: Diagnosis of  By: Doy Mince LPN, Megan    . Obstructive sleep apnea 09/01/2007   Qualifier: Diagnosis of  By: Doy Mince LPN, Megan    . OSA (obstructive sleep apnea)   . Recurrent ventral incisional hernia 08/14/2013  . Right leg pain 08/11/2013  . S/P placement of  cardiac pacemaker 02/22/2016  . Sepsis ([place]) 08/02/2013  . Short gut syndrome due to distal jejunal SB fistula 10/22/2013    Past Surgical History:  Procedure Laterality Date  . APPENDECTOMY    . biventricular pacemaker generator change  04/29/2014   MDT Viva CRT-P BiV pacemaker implanted by Dr Rolla Etienne in [place]  . biventricular pacemaker implantation  06/18/2007   MDT BIV pacemaker implanted by Dr Lovena Le  . BOWEL RESECTION  1980s?   ?colectomy/ostomy - Dr Elesa Hacker  . INCISION AND DRAINAGE ABSCESS N/A 08/14/2013   Procedure: INCISION AND DRAINAGE ABSCESS, mesh ;   Surgeon: [name], MD;  Location: [facility];  Service: General;  Laterality: N/A;  . [place]?   Dr. Lindon Romp - w mesh  . KNEE SURGERY Right   . LEFT HEART CATHETERIZATION WITH CORONARY ANGIOGRAM N/A 03/17/2014   Procedure: LEFT HEART CATHETERIZATION WITH CORONARY ANGIOGRAM;  Surgeon: [name], MD;  Location: [facility];  Service: Cardiovascular;  Laterality: N/A;  . NASAL SEPTUM SURGERY    . OSTOMY TAKE DOWN  1980s?   Dr Elesa Hacker  . TONSILLECTOMY AND ADENOIDECTOMY      Family History  Problem Relation Age of Onset  . Diabetes Mother   . Alcoholism Father   . Heart Problems Maternal Grandmother     Social History   Socioeconomic History  . Marital status: Married    Spouse name: Not on file  . Number of children: Not on file  . Years of education: Not on file  . Highest education level: Not on file  Occupational History  . Not on file  Social Needs  . Financial resource strain: Not on file  . Food insecurity:    Worry: Not on file    Inability: Not on file  . Transportation needs:    Medical: Not on file    Non-medical: Not on file  Tobacco Use  . Smoking status: Former Smoker    Packs/day: 1.00    Years: 40.00    Pack years: 40.00    Types: Cigarettes    Last attempt to quit: 08/21/2012    Years since quitting: 6.2  . Smokeless tobacco: Never Used  Substance and Sexual Activity  . Alcohol use: Yes    Alcohol/week: 0.0 standard drinks    Comment: occasional  . Drug use: No  . Sexual activity: Not on file  Lifestyle  . Physical activity:    Days per week: Not on file    Minutes per session: Not on file  . Stress: Not on file  Relationships  . Social connections:    Talks on phone: Not on file    Gets together: Not on file    Attends religious service: Not on file    Active member of club or organization: Not on file    Attends meetings of clubs or organizations: Not on file    Relationship status: Not on file  . Intimate  partner violence:    Fear of current or ex partner: Not on file    Emotionally abused: Not on file    Physically abused: Not on file    Forced sexual activity: Not on file  Other Topics Concern  . Not on file  Social History Narrative   Lives at [facility] since 02/06/16   Married -[name]   Former smoker -stopped 2013   No Advance Directives    Outpatient Medications Prior to Visit  Medication Sig Dispense Refill  .  Blood Glucose Monitoring Suppl (ACCU-CHEK AVIVA) device Use as instructed 1 each 0  . carvedilol (COREG) 25 MG tablet Take 25 mg by mouth 2 (two) times daily.  3  . cyclobenzaprine (FLEXERIL) 5 MG tablet Take 5 mg by mouth 3 (three) times daily.    . furosemide (LASIX) 80 MG tablet Take 80 mg by mouth daily.     [name] Kitchen gabapentin (NEURONTIN) 300 MG capsule Take 300 mg by mouth every morning.    . insulin NPH Human (HUMULIN N,NOVOLIN N) 100 UNIT/ML injection Inject 20 units in the morning and 20 units in the evening.    . insulin regular (NOVOLIN R,HUMULIN R) 100 units/mL injection Use with sliding scale.    [name] Kitchen KLOR-CON M20 20 MEQ tablet Take 20 mg by mouth 2 (two) times daily.  3  . metolazone (ZAROXOLYN) 2.5 MG tablet Take 2.5 mg by mouth daily.    [name] Kitchen oxyCODONE-acetaminophen (PERCOCET) 10-325 MG tablet Take 1 tablet by mouth every 6 (six) hours as needed.     . rosuvastatin (CRESTOR) 10 MG tablet Take 10 mg by mouth daily.    [name] Kitchen spironolactone (ALDACTONE) 25 MG tablet Take 1 tablet (25 mg total) by mouth 2 (two) times daily. 30 tablet 0  . traZODone (DESYREL) 50 MG tablet TAKE 2 TABLETS BY MOUTH ONCE DAILY AT BEDTIME AS NEEDED  3  . warfarin (COUMADIN) 7.5 MG tablet Take 7.5 mg by mouth daily.     No facility-administered medications prior to visit.     No Known Allergies  ROS Review of Systems  Constitutional: Negative for diaphoresis, fatigue, fever and unexpected weight change.  HENT: Negative.   Eyes: Negative for photophobia and visual disturbance.  Respiratory:  Negative.   Cardiovascular: Negative.   Gastrointestinal: Negative.   Endocrine: Negative for polyphagia and polyuria.  Musculoskeletal: Positive for gait problem. Negative for joint swelling.  Skin: Positive for color change and wound.  Allergic/Immunologic: Positive for immunocompromised state.  Neurological: Positive for weakness.  Hematological: Does not bruise/bleed easily.  Psychiatric/Behavioral: Negative.       Objective:    Physical Exam  Constitutional: He is oriented to person, place, and time. He appears well-developed and well-nourished. No distress.  HENT:  Head: Normocephalic and atraumatic.  Right Ear: External ear normal.  Left Ear: External ear normal.  Eyes: Conjunctivae are normal. Right eye exhibits no discharge. Left eye exhibits no discharge. No scleral icterus.  Neck: Neck supple. No JVD present. No tracheal deviation present. No thyromegaly present.  Cardiovascular: Normal rate, regular rhythm and normal heart sounds.  Pulmonary/Chest: Effort normal and breath sounds normal. No stridor.  Lymphadenopathy:    He has no cervical adenopathy.  Neurological: He is alert and oriented to person, place, and time.  Skin: Skin is warm and dry. He is not diaphoretic.  Psychiatric: He has a normal mood and affect. His behavior is normal.    BP 120/76   Pulse 91   Temp 98.4 F (36.9 C) (Oral)   Ht 6' (1.829 m)   SpO2 96%   BMI 38.38 kg/m  Wt Readings from Last 3 Encounters:  02/13/18 283 lb (128.4 kg)  09/04/16 299 lb (135.6 kg)  02/26/16 (!) 301 lb (136.5 kg)   BP Readings from Last 3 Encounters:  12/03/18 120/76  11/12/18 (!) 142/76  09/17/18 102/64   Guideline developer:  UpToDate (see UpToDate for funding source) Date Released: June 2014  Health Maintenance Due  Topic Date Due  . Hepatitis C Screening  Mar 18, 1951  . FOOT EXAM  05/08/1963  . OPHTHALMOLOGY EXAM  05/08/1963  . URINE MICROALBUMIN  05/08/1963  . TETANUS/TDAP  05/07/1972  .  COLONOSCOPY  05/08/2003  . PNA vac Low Risk Adult (1 of 2 - PCV13) 05/07/2018    There are no preventive care reminders to display for this patient.  Lab Results  Component Value Date   TSH 1.61 08/14/2018   Lab Results  Component Value Date   WBC 11.0 (H) 08/14/2018   HGB 14.2 08/14/2018   HCT 42.1 08/14/2018   MCV 96.7 08/14/2018   PLT 230.0 08/14/2018   Lab Results  Component Value Date   NA 135 08/14/2018   K 3.7 08/14/2018   CO2 30 08/14/2018   GLUCOSE 153 (H) 08/14/2018   BUN 67 (H) 08/14/2018   CREATININE 2.57 (H) 08/14/2018   BILITOT 0.8 08/14/2018   ALKPHOS 27 (L) 08/14/2018   AST 14 08/14/2018   ALT 10 08/14/2018   PROT 7.8 08/14/2018   ALBUMIN 3.5 08/14/2018   CALCIUM 9.1 08/14/2018   ANIONGAP 11 02/18/2018   GFR 32.42 (L) 08/14/2018   Lab Results  Component Value Date   CHOL 122 08/14/2018   Lab Results  Component Value Date   HDL 34.60 (L) 08/14/2018   Lab Results  Component Value Date   LDLCALC 65 08/14/2018   Lab Results  Component Value Date   TRIG 109.0 08/14/2018   Lab Results  Component Value Date   CHOLHDL 4 08/14/2018   Lab Results  Component Value Date   HGBA1C 9.6 (H) 08/14/2018      Assessment & Plan:   Problem List Items Addressed This Visit      Endocrine   Diabetes mellitus, insulin dependent (IDDM), uncontrolled (HCC) - Primary (Chronic)   Relevant Orders   Basic metabolic panel   Hemoglobin A1c   Ambulatory referral to [facility]   Diabetic ulcer of ankle associated with type 2 diabetes mellitus, limited to breakdown of skin ([place])   Relevant Orders   Ambulatory referral to [facility]   Ambulatory referral to Wound Clinic     Genitourinary   CKD (chronic kidney disease), stage III ([place])   Relevant Orders   Basic metabolic panel     Other   Debility   Relevant Orders   Ambulatory referral to [facility]      No orders of the defined types were placed in this encounter.   Follow-up: Return in about  3 months (around 03/03/2019).  [facility].  Hemoglobin A1c at last visit was 9.5 I am concerned about patient's compliance with his NPH insulin.  Patient may be in need of more intensive insulin therapy.

## 2018-12-08 ENCOUNTER — Ambulatory Visit: Payer: Medicare Other | Admitting: Physical Therapy

## 2018-12-08 ENCOUNTER — Telehealth: Payer: Self-pay

## 2018-12-08 MED ORDER — GAUZE BANDAGES MISC: 5 refills | Status: DC

## 2018-12-08 MED ORDER — ADHESIVE PAPER TAPE: MEDICATED_TAPE | 5 refills | Status: DC

## 2018-12-08 MED ORDER — ADHESIVE TAPE TAPE: MEDICATED_TAPE | 5 refills | Status: DC

## 2018-12-08 MED ORDER — CARRINGTON MOISTURE BARRIER EX CREA: TOPICAL_CREAM | CUTANEOUS | 5 refills | Status: DC | PRN

## 2018-12-08 MED ORDER — DISPOSABLE GLOVES MISC: 5 refills | Status: DC

## 2018-12-08 MED ORDER — KERLIX BANDAGE ROLL MISC: 5 refills | Status: DC

## 2018-12-08 MED ORDER — "AMD FOAM DRESSING 4""X4"" PADS": MEDICATED_PAD | 5 refills | Status: DC

## 2018-12-08 MED ORDER — "ABDOMINAL PAD 8""X10"" PADS": 1.0000 | MEDICATED_PAD | 5 refills | Status: DC | PRN

## 2018-12-08 NOTE — Telephone Encounter (Signed)
Supplies ordered; Rx's printed for MD signature. Need to know where patient wants supplies sent. I left him a voicemail to call me back.

## 2018-12-08 NOTE — Telephone Encounter (Signed)
Okay 

## 2018-12-08 NOTE — Telephone Encounter (Signed)
SA-Wife gave me a list of things that this patient is needing: Gloves ABD Pads Foam Foot Dressing Curlex Foot Gauze Tape 4x4 foam dressing for wound on butt Barrier cream  Thx/dmf

## 2018-12-08 NOTE — Telephone Encounter (Signed)
Dr. Ethelene Hal - patient is in need of these supplies, he is about to run out. Please advise if okay to re-order them and I'll take care of that.  Thanks!

## 2018-12-09 NOTE — Telephone Encounter (Signed)
I spoke with patient's wife. Rx's mailed to their home address. Verbal orders given for cream to be applied to the wound on patient's buttock and for the place on his heel.

## 2018-12-10 ENCOUNTER — Ambulatory Visit: Payer: Self-pay

## 2018-12-10 NOTE — Telephone Encounter (Signed)
okay

## 2018-12-14 ENCOUNTER — Ambulatory Visit: Payer: Medicare Other | Admitting: Cardiology

## 2018-12-17 ENCOUNTER — Ambulatory Visit (HOSPITAL_BASED_OUTPATIENT_CLINIC_OR_DEPARTMENT_OTHER): Payer: Medicare Other

## 2018-12-29 ENCOUNTER — Ambulatory Visit: Payer: Medicare Other | Admitting: Neurology

## 2019-01-04 ENCOUNTER — Other Ambulatory Visit: Payer: Self-pay | Admitting: Pharmacist

## 2019-01-04 NOTE — Patient Outreach (Signed)
[facility]) Care Management  01/04/2019  Kurt Cross Jan 30, 1951 [id number]   Patient was called to follow up on medications being sent to mail order. HIPAA identifiers were obtained. Patient said he forgot to mention it to his provider and his provider was routed a note asking that refills be sent to the patient's mail order pharmacy.  Patient said he would have his wife call me back.  Plan: Call patient back in 4 weeks due to referral backlog.   Elayne Guerin, PharmD, [place] Clinical Pharmacist [phone]

## 2019-01-06 ENCOUNTER — Telehealth: Payer: Self-pay

## 2019-01-06 DIAGNOSIS — N183 Chronic kidney disease, stage 3 (moderate): Secondary | ICD-10-CM | POA: Diagnosis not present

## 2019-01-06 DIAGNOSIS — I5042 Chronic combined systolic (congestive) and diastolic (congestive) heart failure: Secondary | ICD-10-CM | POA: Diagnosis not present

## 2019-01-06 DIAGNOSIS — I714 Abdominal aortic aneurysm, without rupture: Secondary | ICD-10-CM | POA: Diagnosis not present

## 2019-01-06 DIAGNOSIS — I4892 Unspecified atrial flutter: Secondary | ICD-10-CM | POA: Diagnosis not present

## 2019-01-06 DIAGNOSIS — I4891 Unspecified atrial fibrillation: Secondary | ICD-10-CM | POA: Diagnosis not present

## 2019-01-06 DIAGNOSIS — E1122 Type 2 diabetes mellitus with diabetic chronic kidney disease: Secondary | ICD-10-CM | POA: Diagnosis not present

## 2019-01-06 DIAGNOSIS — L97421 Non-pressure chronic ulcer of left heel and midfoot limited to breakdown of skin: Secondary | ICD-10-CM | POA: Diagnosis not present

## 2019-01-06 DIAGNOSIS — E11621 Type 2 diabetes mellitus with foot ulcer: Secondary | ICD-10-CM | POA: Diagnosis not present

## 2019-01-06 DIAGNOSIS — I130 Hypertensive heart and chronic kidney disease with heart failure and stage 1 through stage 4 chronic kidney disease, or unspecified chronic kidney disease: Secondary | ICD-10-CM | POA: Diagnosis not present

## 2019-01-06 NOTE — Telephone Encounter (Signed)
Verbal given to [facility].

## 2019-01-06 NOTE — Telephone Encounter (Signed)
Okay 

## 2019-01-06 NOTE — Telephone Encounter (Signed)
Copied from [place] [phone]. Topic: Quick Communication - Home Health Verbal Orders >> Jan 06, 2019 11:16 AM Virl Axe D wrote: Caller/Agency: Kendra / [place] Number: [phone] [name] VM Requesting OT/PT/Skilled Nursing/Social Work/Speech Therapy: PT Frequency: Extend 1 week 4   Social Worker Evaluation for caregiver concerns

## 2019-01-08 ENCOUNTER — Telehealth: Payer: Self-pay | Admitting: Family Medicine

## 2019-01-08 ENCOUNTER — Telehealth: Payer: Self-pay

## 2019-01-08 DIAGNOSIS — L97421 Non-pressure chronic ulcer of left heel and midfoot limited to breakdown of skin: Secondary | ICD-10-CM | POA: Diagnosis not present

## 2019-01-08 DIAGNOSIS — N183 Chronic kidney disease, stage 3 (moderate): Secondary | ICD-10-CM | POA: Diagnosis not present

## 2019-01-08 DIAGNOSIS — E11621 Type 2 diabetes mellitus with foot ulcer: Secondary | ICD-10-CM | POA: Diagnosis not present

## 2019-01-08 DIAGNOSIS — I4892 Unspecified atrial flutter: Secondary | ICD-10-CM | POA: Diagnosis not present

## 2019-01-08 DIAGNOSIS — I714 Abdominal aortic aneurysm, without rupture: Secondary | ICD-10-CM | POA: Diagnosis not present

## 2019-01-08 DIAGNOSIS — E1122 Type 2 diabetes mellitus with diabetic chronic kidney disease: Secondary | ICD-10-CM | POA: Diagnosis not present

## 2019-01-08 DIAGNOSIS — I130 Hypertensive heart and chronic kidney disease with heart failure and stage 1 through stage 4 chronic kidney disease, or unspecified chronic kidney disease: Secondary | ICD-10-CM | POA: Diagnosis not present

## 2019-01-08 DIAGNOSIS — I4891 Unspecified atrial fibrillation: Secondary | ICD-10-CM | POA: Diagnosis not present

## 2019-01-08 DIAGNOSIS — I5042 Chronic combined systolic (congestive) and diastolic (congestive) heart failure: Secondary | ICD-10-CM | POA: Diagnosis not present

## 2019-01-08 NOTE — Telephone Encounter (Signed)
Copied from [place] [id number]. Topic: Quick Communication - Home Health Verbal Orders >> Jan 08, 2019  9:08 AM Richardo Priest, [place] wrote: Caller/Agency: [place],Katie/ [place] Number: [phone] Requesting OT/PT/Skilled Nursing/Social Work/Speech Therapy: OT/Skilled Nursing/Wound Care Frequency: OT is requesting 1 week. Skilled nursing is requesting 3-8. Wound care is being requested to be done at least once a week. Requesting a Calcium Alginate to help L heel debris, or Silver seal.

## 2019-01-08 NOTE — Telephone Encounter (Signed)
Copied from [place] [id number]. Topic: General - Inquiry >> Jan 08, 2019  4:44 PM Lionel December wrote: Reason for CRM: Kurt Larsen, RN from [facility] called about  Pt's wounds on heal. She is using a Silver seal alginate protocol. Pt also has an inflammatory pressure ulcer on the other foot. No open wounds. Scrotum and sacrum pressure ulcers, stage 2.

## 2019-01-11 ENCOUNTER — Other Ambulatory Visit: Payer: Self-pay | Admitting: Pharmacist

## 2019-01-12 ENCOUNTER — Telehealth: Payer: Self-pay | Admitting: Internal Medicine

## 2019-01-12 DIAGNOSIS — I714 Abdominal aortic aneurysm, without rupture: Secondary | ICD-10-CM | POA: Diagnosis not present

## 2019-01-12 DIAGNOSIS — I130 Hypertensive heart and chronic kidney disease with heart failure and stage 1 through stage 4 chronic kidney disease, or unspecified chronic kidney disease: Secondary | ICD-10-CM | POA: Diagnosis not present

## 2019-01-12 DIAGNOSIS — E11621 Type 2 diabetes mellitus with foot ulcer: Secondary | ICD-10-CM | POA: Diagnosis not present

## 2019-01-12 DIAGNOSIS — E1122 Type 2 diabetes mellitus with diabetic chronic kidney disease: Secondary | ICD-10-CM | POA: Diagnosis not present

## 2019-01-12 DIAGNOSIS — N183 Chronic kidney disease, stage 3 (moderate): Secondary | ICD-10-CM | POA: Diagnosis not present

## 2019-01-12 DIAGNOSIS — I5042 Chronic combined systolic (congestive) and diastolic (congestive) heart failure: Secondary | ICD-10-CM | POA: Diagnosis not present

## 2019-01-12 DIAGNOSIS — E1165 Type 2 diabetes mellitus with hyperglycemia: Secondary | ICD-10-CM | POA: Diagnosis not present

## 2019-01-12 DIAGNOSIS — E785 Hyperlipidemia, unspecified: Secondary | ICD-10-CM | POA: Diagnosis not present

## 2019-01-12 DIAGNOSIS — I4892 Unspecified atrial flutter: Secondary | ICD-10-CM | POA: Diagnosis not present

## 2019-01-12 DIAGNOSIS — L97421 Non-pressure chronic ulcer of left heel and midfoot limited to breakdown of skin: Secondary | ICD-10-CM | POA: Diagnosis not present

## 2019-01-12 DIAGNOSIS — I4891 Unspecified atrial fibrillation: Secondary | ICD-10-CM | POA: Diagnosis not present

## 2019-01-12 NOTE — Patient Outreach (Signed)
[facility]) Care Management  01/12/2019  Kurt Cross [birth year] [id number]  Received a call from a Nurse Practitioner Kurt Cross), from [facility]. Kurt Cross was calling about the patient's warfarin management. He said he called Dr. Jackalyn Cross office and was told by someone at their office that I was managing the patient's warfarin therapy.   Although capable, I am not currently under a CPP agreement with any providers so I am not legally able to write prescriptions at this time.  In addition, every note written about and for the patient dealt with medication affordability issues.  When I first spoke with the patient a few months ago, he said he went [facility] to have his INR levels drawn and to have his wafarin managed.  Kurt Cross is not on Epic. Thus, his lab values were not available.   Even as recent as yesterday, patient said he went to have his labs drawn at [facility] two weeks ago. [facility] was called, they reported the patient has not been back to their office since December of 2019.   Documentation was recorded 09/11/18 where a [facility] called in an INR of 1.8. Dr. Luetta Cross titrated the patient's dose and documented a recheck in 2 weeks.  The prescription was written by Dr. Janie Cross 09/25/18 for Coumadin 7.5mg  and was refilled on 12/28/2018..  Dr. Jackalyn Cross office was called and Kurt Cross from the Coumadin Clinic called me back and said the the patient was not followed by them but was eligible to be followed since he is a patient of Dr. Rayann Cross.  From chart review, a home health agency was quoted as calling in an INR and requesting a call back at: [phone].  I called [phone] and left a HIPAA compliant message requesting a call back.  The nurse called me back.  Her name is Kurt Cross and said she has not followed the patient since December and that he was discharged from [facility].  Kurt Cross from  [facility] was called back and left the number to reach the Coumadin Clinic to have the patient establish care for monitoring.  Plan: Follow up in 3-5 business days.   Kurt Cross, PharmD, [place] Clinical Pharmacist [phone]

## 2019-01-12 NOTE — Telephone Encounter (Signed)
° ° °  [place] calling to discuss Coumadin management  Please call [phone]    1.  Are you calling in regards to an appointment? no  2.  Are you calling for a refill ? no  3.  Are you having bleeding issues? no  4.  Do you need clearance to hold Coumadin? no   Please route to the Coumadin Clinic Pool

## 2019-01-12 NOTE — Telephone Encounter (Signed)
Spoke with [place] We are not managing this patients coumadin. He is a patient of Dr. Rayann Heman (since December) but we have not been managing his coumadin. Patient has services coming out to his house, they are the ones who contacted [facility] asking if they draw the lab who will manage. I have asked Alwyn Ren to find out who gave the original order to have INR checked. We can manage INR if needed. Per Alwyn Ren patient is a poor historian and non compliant. She will call us back if needed.

## 2019-01-13 ENCOUNTER — Other Ambulatory Visit: Payer: Self-pay | Admitting: Nurse Practitioner

## 2019-01-13 DIAGNOSIS — I714 Abdominal aortic aneurysm, without rupture, unspecified: Secondary | ICD-10-CM

## 2019-01-13 DIAGNOSIS — E1122 Type 2 diabetes mellitus with diabetic chronic kidney disease: Secondary | ICD-10-CM | POA: Diagnosis not present

## 2019-01-13 DIAGNOSIS — L97421 Non-pressure chronic ulcer of left heel and midfoot limited to breakdown of skin: Secondary | ICD-10-CM | POA: Diagnosis not present

## 2019-01-13 DIAGNOSIS — I5042 Chronic combined systolic (congestive) and diastolic (congestive) heart failure: Secondary | ICD-10-CM | POA: Diagnosis not present

## 2019-01-13 DIAGNOSIS — E11621 Type 2 diabetes mellitus with foot ulcer: Secondary | ICD-10-CM | POA: Diagnosis not present

## 2019-01-13 DIAGNOSIS — I130 Hypertensive heart and chronic kidney disease with heart failure and stage 1 through stage 4 chronic kidney disease, or unspecified chronic kidney disease: Secondary | ICD-10-CM | POA: Diagnosis not present

## 2019-01-13 DIAGNOSIS — I4891 Unspecified atrial fibrillation: Secondary | ICD-10-CM | POA: Diagnosis not present

## 2019-01-13 DIAGNOSIS — I4892 Unspecified atrial flutter: Secondary | ICD-10-CM | POA: Diagnosis not present

## 2019-01-13 DIAGNOSIS — N183 Chronic kidney disease, stage 3 (moderate): Secondary | ICD-10-CM | POA: Diagnosis not present

## 2019-01-14 DIAGNOSIS — Z0000 Encounter for general adult medical examination without abnormal findings: Secondary | ICD-10-CM | POA: Diagnosis not present

## 2019-01-14 DIAGNOSIS — E1122 Type 2 diabetes mellitus with diabetic chronic kidney disease: Secondary | ICD-10-CM | POA: Diagnosis not present

## 2019-01-14 DIAGNOSIS — I5042 Chronic combined systolic (congestive) and diastolic (congestive) heart failure: Secondary | ICD-10-CM | POA: Diagnosis not present

## 2019-01-14 DIAGNOSIS — N183 Chronic kidney disease, stage 3 (moderate): Secondary | ICD-10-CM | POA: Diagnosis not present

## 2019-01-14 DIAGNOSIS — I4891 Unspecified atrial fibrillation: Secondary | ICD-10-CM | POA: Diagnosis not present

## 2019-01-14 DIAGNOSIS — I130 Hypertensive heart and chronic kidney disease with heart failure and stage 1 through stage 4 chronic kidney disease, or unspecified chronic kidney disease: Secondary | ICD-10-CM | POA: Diagnosis not present

## 2019-01-14 DIAGNOSIS — L97421 Non-pressure chronic ulcer of left heel and midfoot limited to breakdown of skin: Secondary | ICD-10-CM | POA: Diagnosis not present

## 2019-01-14 DIAGNOSIS — L942 Calcinosis cutis: Secondary | ICD-10-CM | POA: Diagnosis not present

## 2019-01-14 DIAGNOSIS — I714 Abdominal aortic aneurysm, without rupture: Secondary | ICD-10-CM | POA: Diagnosis not present

## 2019-01-14 DIAGNOSIS — E11621 Type 2 diabetes mellitus with foot ulcer: Secondary | ICD-10-CM | POA: Diagnosis not present

## 2019-01-14 DIAGNOSIS — I4892 Unspecified atrial flutter: Secondary | ICD-10-CM | POA: Diagnosis not present

## 2019-01-15 DIAGNOSIS — N183 Chronic kidney disease, stage 3 (moderate): Secondary | ICD-10-CM | POA: Diagnosis not present

## 2019-01-15 DIAGNOSIS — I714 Abdominal aortic aneurysm, without rupture: Secondary | ICD-10-CM | POA: Diagnosis not present

## 2019-01-15 DIAGNOSIS — I4891 Unspecified atrial fibrillation: Secondary | ICD-10-CM | POA: Diagnosis not present

## 2019-01-15 DIAGNOSIS — E1122 Type 2 diabetes mellitus with diabetic chronic kidney disease: Secondary | ICD-10-CM | POA: Diagnosis not present

## 2019-01-15 DIAGNOSIS — I4892 Unspecified atrial flutter: Secondary | ICD-10-CM | POA: Diagnosis not present

## 2019-01-15 DIAGNOSIS — I130 Hypertensive heart and chronic kidney disease with heart failure and stage 1 through stage 4 chronic kidney disease, or unspecified chronic kidney disease: Secondary | ICD-10-CM | POA: Diagnosis not present

## 2019-01-15 DIAGNOSIS — I5042 Chronic combined systolic (congestive) and diastolic (congestive) heart failure: Secondary | ICD-10-CM | POA: Diagnosis not present

## 2019-01-15 DIAGNOSIS — E11621 Type 2 diabetes mellitus with foot ulcer: Secondary | ICD-10-CM | POA: Diagnosis not present

## 2019-01-15 DIAGNOSIS — L97421 Non-pressure chronic ulcer of left heel and midfoot limited to breakdown of skin: Secondary | ICD-10-CM | POA: Diagnosis not present

## 2019-01-19 ENCOUNTER — Ambulatory Visit: Payer: Self-pay | Admitting: Pharmacist

## 2019-01-19 ENCOUNTER — Encounter: Payer: Self-pay | Admitting: Pharmacist

## 2019-01-19 ENCOUNTER — Other Ambulatory Visit: Payer: Self-pay | Admitting: Pharmacist

## 2019-01-19 DIAGNOSIS — I4891 Unspecified atrial fibrillation: Secondary | ICD-10-CM | POA: Diagnosis not present

## 2019-01-19 DIAGNOSIS — I5042 Chronic combined systolic (congestive) and diastolic (congestive) heart failure: Secondary | ICD-10-CM | POA: Diagnosis not present

## 2019-01-19 DIAGNOSIS — I4892 Unspecified atrial flutter: Secondary | ICD-10-CM | POA: Diagnosis not present

## 2019-01-19 DIAGNOSIS — R6889 Other general symptoms and signs: Secondary | ICD-10-CM | POA: Diagnosis not present

## 2019-01-19 DIAGNOSIS — I130 Hypertensive heart and chronic kidney disease with heart failure and stage 1 through stage 4 chronic kidney disease, or unspecified chronic kidney disease: Secondary | ICD-10-CM | POA: Diagnosis not present

## 2019-01-19 DIAGNOSIS — I714 Abdominal aortic aneurysm, without rupture: Secondary | ICD-10-CM | POA: Diagnosis not present

## 2019-01-19 DIAGNOSIS — E11621 Type 2 diabetes mellitus with foot ulcer: Secondary | ICD-10-CM | POA: Diagnosis not present

## 2019-01-19 DIAGNOSIS — L97421 Non-pressure chronic ulcer of left heel and midfoot limited to breakdown of skin: Secondary | ICD-10-CM | POA: Diagnosis not present

## 2019-01-19 DIAGNOSIS — N183 Chronic kidney disease, stage 3 (moderate): Secondary | ICD-10-CM | POA: Diagnosis not present

## 2019-01-19 DIAGNOSIS — E1122 Type 2 diabetes mellitus with diabetic chronic kidney disease: Secondary | ICD-10-CM | POA: Diagnosis not present

## 2019-01-19 NOTE — Patient Outreach (Signed)
[facility]) Care Management  01/19/2019  Beldon Nowling Kos 1951-03-30 [id number]   Called patient to follow up on Warfarin management. Unfortunately, he did not answer the phone. HIPAA compliant message was left on his voicemail.  Called the NP from [place]. Quillian Quince said he was on a conference call and would need to call me back.  Review of patient's chart shows he is not currently being followed for Warfarin Management by a provider within [facility].   Plan: Await a call back from [place]. Follow up in 3-5 business days.   Elayne Guerin, PharmD, [place] Clinical Pharmacist [phone]

## 2019-01-21 DIAGNOSIS — I130 Hypertensive heart and chronic kidney disease with heart failure and stage 1 through stage 4 chronic kidney disease, or unspecified chronic kidney disease: Secondary | ICD-10-CM | POA: Diagnosis not present

## 2019-01-21 DIAGNOSIS — E1122 Type 2 diabetes mellitus with diabetic chronic kidney disease: Secondary | ICD-10-CM | POA: Diagnosis not present

## 2019-01-21 DIAGNOSIS — I714 Abdominal aortic aneurysm, without rupture: Secondary | ICD-10-CM | POA: Diagnosis not present

## 2019-01-21 DIAGNOSIS — I4891 Unspecified atrial fibrillation: Secondary | ICD-10-CM | POA: Diagnosis not present

## 2019-01-21 DIAGNOSIS — I5042 Chronic combined systolic (congestive) and diastolic (congestive) heart failure: Secondary | ICD-10-CM | POA: Diagnosis not present

## 2019-01-21 DIAGNOSIS — I4892 Unspecified atrial flutter: Secondary | ICD-10-CM | POA: Diagnosis not present

## 2019-01-21 DIAGNOSIS — N183 Chronic kidney disease, stage 3 (moderate): Secondary | ICD-10-CM | POA: Diagnosis not present

## 2019-01-21 DIAGNOSIS — L97421 Non-pressure chronic ulcer of left heel and midfoot limited to breakdown of skin: Secondary | ICD-10-CM | POA: Diagnosis not present

## 2019-01-21 DIAGNOSIS — E11621 Type 2 diabetes mellitus with foot ulcer: Secondary | ICD-10-CM | POA: Diagnosis not present

## 2019-01-22 DIAGNOSIS — I4891 Unspecified atrial fibrillation: Secondary | ICD-10-CM | POA: Diagnosis not present

## 2019-01-22 DIAGNOSIS — E11621 Type 2 diabetes mellitus with foot ulcer: Secondary | ICD-10-CM | POA: Diagnosis not present

## 2019-01-22 DIAGNOSIS — N183 Chronic kidney disease, stage 3 (moderate): Secondary | ICD-10-CM | POA: Diagnosis not present

## 2019-01-22 DIAGNOSIS — I4892 Unspecified atrial flutter: Secondary | ICD-10-CM | POA: Diagnosis not present

## 2019-01-22 DIAGNOSIS — L97421 Non-pressure chronic ulcer of left heel and midfoot limited to breakdown of skin: Secondary | ICD-10-CM | POA: Diagnosis not present

## 2019-01-22 DIAGNOSIS — E1122 Type 2 diabetes mellitus with diabetic chronic kidney disease: Secondary | ICD-10-CM | POA: Diagnosis not present

## 2019-01-22 DIAGNOSIS — I130 Hypertensive heart and chronic kidney disease with heart failure and stage 1 through stage 4 chronic kidney disease, or unspecified chronic kidney disease: Secondary | ICD-10-CM | POA: Diagnosis not present

## 2019-01-22 DIAGNOSIS — I5042 Chronic combined systolic (congestive) and diastolic (congestive) heart failure: Secondary | ICD-10-CM | POA: Diagnosis not present

## 2019-01-22 DIAGNOSIS — I714 Abdominal aortic aneurysm, without rupture: Secondary | ICD-10-CM | POA: Diagnosis not present

## 2019-01-26 ENCOUNTER — Other Ambulatory Visit: Payer: Self-pay | Admitting: Pharmacist

## 2019-01-26 DIAGNOSIS — I5042 Chronic combined systolic (congestive) and diastolic (congestive) heart failure: Secondary | ICD-10-CM | POA: Diagnosis not present

## 2019-01-26 DIAGNOSIS — N183 Chronic kidney disease, stage 3 (moderate): Secondary | ICD-10-CM | POA: Diagnosis not present

## 2019-01-26 DIAGNOSIS — L97421 Non-pressure chronic ulcer of left heel and midfoot limited to breakdown of skin: Secondary | ICD-10-CM | POA: Diagnosis not present

## 2019-01-26 DIAGNOSIS — E11621 Type 2 diabetes mellitus with foot ulcer: Secondary | ICD-10-CM | POA: Diagnosis not present

## 2019-01-26 DIAGNOSIS — E1122 Type 2 diabetes mellitus with diabetic chronic kidney disease: Secondary | ICD-10-CM | POA: Diagnosis not present

## 2019-01-26 DIAGNOSIS — I714 Abdominal aortic aneurysm, without rupture: Secondary | ICD-10-CM | POA: Diagnosis not present

## 2019-01-26 DIAGNOSIS — I4891 Unspecified atrial fibrillation: Secondary | ICD-10-CM | POA: Diagnosis not present

## 2019-01-26 DIAGNOSIS — I130 Hypertensive heart and chronic kidney disease with heart failure and stage 1 through stage 4 chronic kidney disease, or unspecified chronic kidney disease: Secondary | ICD-10-CM | POA: Diagnosis not present

## 2019-01-26 DIAGNOSIS — I4892 Unspecified atrial flutter: Secondary | ICD-10-CM | POA: Diagnosis not present

## 2019-01-26 NOTE — Patient Outreach (Addendum)
[facility]) Care Management  01/26/2019  Lynwood Kubisiak [place] March 12, 1951 [id number]   Called patient to follow up with him. HIPAA identifiers were obtained. Also spoke with the Nurse Practitioner from [facility].  Quillian Quince confirmed Elohim will be managing the patient's warfarin level and his general care. Elohim will be managing the patient and coordinating with his providers.  Patient previously stated he was unable to provide the necessary financial paperwork to complete patient assistance program applications for him.  In addition, he did not follow through on getting completely set up with the mail order pharmacy as they needed a debit card on file.   Plan: Close the patient's case since [facility] is taking over his care and the patient was originally referred for medication assistance and said he could not provide the necessary documentation. No other [facility] Service line is involved in the patient's care.   Elayne Guerin, PharmD, [place] Clinical Pharmacist [phone]

## 2019-01-27 ENCOUNTER — Telehealth: Payer: Self-pay | Admitting: Internal Medicine

## 2019-01-27 DIAGNOSIS — L97421 Non-pressure chronic ulcer of left heel and midfoot limited to breakdown of skin: Secondary | ICD-10-CM | POA: Diagnosis not present

## 2019-01-27 DIAGNOSIS — E11621 Type 2 diabetes mellitus with foot ulcer: Secondary | ICD-10-CM | POA: Diagnosis not present

## 2019-01-27 DIAGNOSIS — N183 Chronic kidney disease, stage 3 (moderate): Secondary | ICD-10-CM | POA: Diagnosis not present

## 2019-01-27 DIAGNOSIS — E1122 Type 2 diabetes mellitus with diabetic chronic kidney disease: Secondary | ICD-10-CM | POA: Diagnosis not present

## 2019-01-27 DIAGNOSIS — I130 Hypertensive heart and chronic kidney disease with heart failure and stage 1 through stage 4 chronic kidney disease, or unspecified chronic kidney disease: Secondary | ICD-10-CM | POA: Diagnosis not present

## 2019-01-27 DIAGNOSIS — I4891 Unspecified atrial fibrillation: Secondary | ICD-10-CM | POA: Diagnosis not present

## 2019-01-27 DIAGNOSIS — I714 Abdominal aortic aneurysm, without rupture: Secondary | ICD-10-CM | POA: Diagnosis not present

## 2019-01-27 DIAGNOSIS — I5042 Chronic combined systolic (congestive) and diastolic (congestive) heart failure: Secondary | ICD-10-CM | POA: Diagnosis not present

## 2019-01-27 DIAGNOSIS — I4892 Unspecified atrial flutter: Secondary | ICD-10-CM | POA: Diagnosis not present

## 2019-01-27 NOTE — Telephone Encounter (Signed)
Returned call to RN to advise her that we have never monitored patient's INR before - she states pt recently changed PCPs and is now being seen by Dr Daphene Jaeger. We do not have any records of warfarin dosing etc and prior PCP should have hopefully passed information along to new PCP office. RN will call Dr Altamese Dilling office regarding INR monitoring.  She inquired about BP management as well - advised her that Dr Rayann Heman has seen pt once for electrophysiology, but BP was not addressed at that time. She will bring these concerns to PCP as well.

## 2019-01-27 NOTE — Telephone Encounter (Signed)
PT 16.4  INR 1.4 7.5 Mg Coumadin Daily   Lisa from [facility] called. She wanted to know how much additional medication the patient should take to get his INR under control. Above values were current and given by the [facility].

## 2019-01-27 NOTE — Telephone Encounter (Signed)
HHN Also stated that the pt's BP 158/93 after taking his BP medication.  Yesterday: 149/98 Last Week: 140/90  HHN wants to know if there are any other medications that the pt could be taking to control his bp. He is taking 25mg  carvedilol 2x daily

## 2019-01-28 DIAGNOSIS — I5042 Chronic combined systolic (congestive) and diastolic (congestive) heart failure: Secondary | ICD-10-CM | POA: Diagnosis not present

## 2019-01-28 DIAGNOSIS — I4892 Unspecified atrial flutter: Secondary | ICD-10-CM | POA: Diagnosis not present

## 2019-01-28 DIAGNOSIS — I714 Abdominal aortic aneurysm, without rupture: Secondary | ICD-10-CM | POA: Diagnosis not present

## 2019-01-28 DIAGNOSIS — I4891 Unspecified atrial fibrillation: Secondary | ICD-10-CM | POA: Diagnosis not present

## 2019-01-28 DIAGNOSIS — E1122 Type 2 diabetes mellitus with diabetic chronic kidney disease: Secondary | ICD-10-CM | POA: Diagnosis not present

## 2019-01-28 DIAGNOSIS — N183 Chronic kidney disease, stage 3 (moderate): Secondary | ICD-10-CM | POA: Diagnosis not present

## 2019-01-28 DIAGNOSIS — L97421 Non-pressure chronic ulcer of left heel and midfoot limited to breakdown of skin: Secondary | ICD-10-CM | POA: Diagnosis not present

## 2019-01-28 DIAGNOSIS — I130 Hypertensive heart and chronic kidney disease with heart failure and stage 1 through stage 4 chronic kidney disease, or unspecified chronic kidney disease: Secondary | ICD-10-CM | POA: Diagnosis not present

## 2019-01-28 DIAGNOSIS — E11621 Type 2 diabetes mellitus with foot ulcer: Secondary | ICD-10-CM | POA: Diagnosis not present

## 2019-02-03 ENCOUNTER — Encounter (HOSPITAL_COMMUNITY): Payer: Self-pay

## 2019-02-03 ENCOUNTER — Emergency Department (HOSPITAL_COMMUNITY): Payer: Medicare HMO

## 2019-02-03 ENCOUNTER — Other Ambulatory Visit: Payer: Self-pay

## 2019-02-03 ENCOUNTER — Inpatient Hospital Stay (HOSPITAL_COMMUNITY)
Admission: EM | Admit: 2019-02-03 | Discharge: 2019-02-12 | DRG: 291 | Disposition: A | Discharge status: Skilled Nursing Facility | Payer: Medicare HMO | Attending: Family Medicine | Admitting: Family Medicine

## 2019-02-03 ENCOUNTER — Telehealth: Payer: Self-pay | Admitting: Family Medicine

## 2019-02-03 DIAGNOSIS — R05 Cough: Secondary | ICD-10-CM | POA: Diagnosis not present

## 2019-02-03 DIAGNOSIS — K3189 Other diseases of stomach and duodenum: Secondary | ICD-10-CM | POA: Diagnosis present

## 2019-02-03 DIAGNOSIS — I5042 Chronic combined systolic (congestive) and diastolic (congestive) heart failure: Secondary | ICD-10-CM | POA: Diagnosis not present

## 2019-02-03 DIAGNOSIS — I361 Nonrheumatic tricuspid (valve) insufficiency: Secondary | ICD-10-CM | POA: Diagnosis not present

## 2019-02-03 DIAGNOSIS — R1110 Vomiting, unspecified: Secondary | ICD-10-CM

## 2019-02-03 DIAGNOSIS — E782 Mixed hyperlipidemia: Secondary | ICD-10-CM | POA: Diagnosis present

## 2019-02-03 DIAGNOSIS — I130 Hypertensive heart and chronic kidney disease with heart failure and stage 1 through stage 4 chronic kidney disease, or unspecified chronic kidney disease: Principal | ICD-10-CM | POA: Diagnosis present

## 2019-02-03 DIAGNOSIS — E1122 Type 2 diabetes mellitus with diabetic chronic kidney disease: Secondary | ICD-10-CM | POA: Diagnosis not present

## 2019-02-03 DIAGNOSIS — I10 Essential (primary) hypertension: Secondary | ICD-10-CM | POA: Diagnosis present

## 2019-02-03 DIAGNOSIS — Z950 Presence of cardiac pacemaker: Secondary | ICD-10-CM | POA: Diagnosis not present

## 2019-02-03 DIAGNOSIS — Z20828 Contact with and (suspected) exposure to other viral communicable diseases: Secondary | ICD-10-CM | POA: Diagnosis present

## 2019-02-03 DIAGNOSIS — I4891 Unspecified atrial fibrillation: Secondary | ICD-10-CM | POA: Diagnosis not present

## 2019-02-03 DIAGNOSIS — T501X6A Underdosing of loop [high-ceiling] diuretics, initial encounter: Secondary | ICD-10-CM | POA: Diagnosis present

## 2019-02-03 DIAGNOSIS — I4892 Unspecified atrial flutter: Secondary | ICD-10-CM | POA: Diagnosis present

## 2019-02-03 DIAGNOSIS — Z91128 Patient's intentional underdosing of medication regimen for other reason: Secondary | ICD-10-CM | POA: Diagnosis not present

## 2019-02-03 DIAGNOSIS — R5381 Other malaise: Secondary | ICD-10-CM | POA: Diagnosis present

## 2019-02-03 DIAGNOSIS — R112 Nausea with vomiting, unspecified: Secondary | ICD-10-CM | POA: Diagnosis not present

## 2019-02-03 DIAGNOSIS — R0602 Shortness of breath: Secondary | ICD-10-CM | POA: Diagnosis not present

## 2019-02-03 DIAGNOSIS — E1165 Type 2 diabetes mellitus with hyperglycemia: Secondary | ICD-10-CM | POA: Diagnosis not present

## 2019-02-03 DIAGNOSIS — K567 Ileus, unspecified: Secondary | ICD-10-CM | POA: Diagnosis present

## 2019-02-03 DIAGNOSIS — I272 Pulmonary hypertension, unspecified: Secondary | ICD-10-CM | POA: Diagnosis present

## 2019-02-03 DIAGNOSIS — L97301 Non-pressure chronic ulcer of unspecified ankle limited to breakdown of skin: Secondary | ICD-10-CM

## 2019-02-03 DIAGNOSIS — Z66 Do not resuscitate: Secondary | ICD-10-CM | POA: Diagnosis present

## 2019-02-03 DIAGNOSIS — Z7401 Bed confinement status: Secondary | ICD-10-CM | POA: Diagnosis not present

## 2019-02-03 DIAGNOSIS — I712 Thoracic aortic aneurysm, without rupture, unspecified: Secondary | ICD-10-CM | POA: Diagnosis present

## 2019-02-03 DIAGNOSIS — I2510 Atherosclerotic heart disease of native coronary artery without angina pectoris: Secondary | ICD-10-CM | POA: Diagnosis present

## 2019-02-03 DIAGNOSIS — N183 Chronic kidney disease, stage 3 (moderate): Secondary | ICD-10-CM | POA: Diagnosis not present

## 2019-02-03 DIAGNOSIS — I482 Chronic atrial fibrillation, unspecified: Secondary | ICD-10-CM

## 2019-02-03 DIAGNOSIS — R062 Wheezing: Secondary | ICD-10-CM | POA: Diagnosis not present

## 2019-02-03 DIAGNOSIS — G4700 Insomnia, unspecified: Secondary | ICD-10-CM | POA: Diagnosis present

## 2019-02-03 DIAGNOSIS — Z7901 Long term (current) use of anticoagulants: Secondary | ICD-10-CM | POA: Diagnosis not present

## 2019-02-03 DIAGNOSIS — Z87891 Personal history of nicotine dependence: Secondary | ICD-10-CM | POA: Diagnosis not present

## 2019-02-03 DIAGNOSIS — I5043 Acute on chronic combined systolic (congestive) and diastolic (congestive) heart failure: Secondary | ICD-10-CM | POA: Diagnosis not present

## 2019-02-03 DIAGNOSIS — M2550 Pain in unspecified joint: Secondary | ICD-10-CM | POA: Diagnosis not present

## 2019-02-03 DIAGNOSIS — L97421 Non-pressure chronic ulcer of left heel and midfoot limited to breakdown of skin: Secondary | ICD-10-CM | POA: Diagnosis not present

## 2019-02-03 DIAGNOSIS — L97329 Non-pressure chronic ulcer of left ankle with unspecified severity: Secondary | ICD-10-CM | POA: Diagnosis not present

## 2019-02-03 DIAGNOSIS — E11621 Type 2 diabetes mellitus with foot ulcer: Secondary | ICD-10-CM | POA: Diagnosis not present

## 2019-02-03 DIAGNOSIS — E11622 Type 2 diabetes mellitus with other skin ulcer: Secondary | ICD-10-CM | POA: Diagnosis present

## 2019-02-03 DIAGNOSIS — J9621 Acute and chronic respiratory failure with hypoxia: Secondary | ICD-10-CM | POA: Diagnosis not present

## 2019-02-03 DIAGNOSIS — Z794 Long term (current) use of insulin: Secondary | ICD-10-CM | POA: Diagnosis not present

## 2019-02-03 DIAGNOSIS — N184 Chronic kidney disease, stage 4 (severe): Secondary | ICD-10-CM | POA: Diagnosis present

## 2019-02-03 DIAGNOSIS — Z79899 Other long term (current) drug therapy: Secondary | ICD-10-CM | POA: Diagnosis not present

## 2019-02-03 DIAGNOSIS — R140 Abdominal distension (gaseous): Secondary | ICD-10-CM | POA: Diagnosis not present

## 2019-02-03 DIAGNOSIS — I4821 Permanent atrial fibrillation: Secondary | ICD-10-CM | POA: Diagnosis not present

## 2019-02-03 DIAGNOSIS — R059 Cough, unspecified: Secondary | ICD-10-CM

## 2019-02-03 DIAGNOSIS — L89619 Pressure ulcer of right heel, unspecified stage: Secondary | ICD-10-CM | POA: Diagnosis present

## 2019-02-03 DIAGNOSIS — I5023 Acute on chronic systolic (congestive) heart failure: Secondary | ICD-10-CM | POA: Diagnosis not present

## 2019-02-03 DIAGNOSIS — I714 Abdominal aortic aneurysm, without rupture: Secondary | ICD-10-CM | POA: Diagnosis not present

## 2019-02-03 DIAGNOSIS — N179 Acute kidney failure, unspecified: Secondary | ICD-10-CM | POA: Diagnosis present

## 2019-02-03 DIAGNOSIS — Z833 Family history of diabetes mellitus: Secondary | ICD-10-CM

## 2019-02-03 DIAGNOSIS — E876 Hypokalemia: Secondary | ICD-10-CM | POA: Diagnosis not present

## 2019-02-03 DIAGNOSIS — G4733 Obstructive sleep apnea (adult) (pediatric): Secondary | ICD-10-CM | POA: Diagnosis not present

## 2019-02-03 DIAGNOSIS — K6389 Other specified diseases of intestine: Secondary | ICD-10-CM | POA: Diagnosis not present

## 2019-02-03 DIAGNOSIS — Z6841 Body Mass Index (BMI) 40.0 and over, adult: Secondary | ICD-10-CM

## 2019-02-03 DIAGNOSIS — Z9119 Patient's noncompliance with other medical treatment and regimen: Secondary | ICD-10-CM | POA: Diagnosis not present

## 2019-02-03 DIAGNOSIS — R0989 Other specified symptoms and signs involving the circulatory and respiratory systems: Secondary | ICD-10-CM | POA: Diagnosis not present

## 2019-02-03 DIAGNOSIS — I509 Heart failure, unspecified: Secondary | ICD-10-CM

## 2019-02-03 DIAGNOSIS — E1142 Type 2 diabetes mellitus with diabetic polyneuropathy: Secondary | ICD-10-CM | POA: Diagnosis present

## 2019-02-03 DIAGNOSIS — I499 Cardiac arrhythmia, unspecified: Secondary | ICD-10-CM | POA: Diagnosis not present

## 2019-02-03 DIAGNOSIS — R0902 Hypoxemia: Secondary | ICD-10-CM

## 2019-02-03 DIAGNOSIS — I340 Nonrheumatic mitral (valve) insufficiency: Secondary | ICD-10-CM | POA: Diagnosis not present

## 2019-02-03 DIAGNOSIS — R531 Weakness: Secondary | ICD-10-CM | POA: Diagnosis not present

## 2019-02-03 DIAGNOSIS — E785 Hyperlipidemia, unspecified: Secondary | ICD-10-CM | POA: Diagnosis present

## 2019-02-03 DIAGNOSIS — Z993 Dependence on wheelchair: Secondary | ICD-10-CM | POA: Diagnosis not present

## 2019-02-03 DIAGNOSIS — Z209 Contact with and (suspected) exposure to unspecified communicable disease: Secondary | ICD-10-CM | POA: Diagnosis not present

## 2019-02-03 DIAGNOSIS — L97429 Non-pressure chronic ulcer of left heel and midfoot with unspecified severity: Secondary | ICD-10-CM | POA: Diagnosis not present

## 2019-02-03 DIAGNOSIS — R1084 Generalized abdominal pain: Secondary | ICD-10-CM | POA: Diagnosis not present

## 2019-02-03 HISTORY — DX: Fistula of intestine: K63.2

## 2019-02-03 LAB — HEPATIC FUNCTION PANEL
ALT: 10 U/L (ref 0–44)
AST: 12 U/L — ABNORMAL LOW (ref 15–41)
Albumin: 2.7 g/dL — ABNORMAL LOW (ref 3.5–5.0)
Alkaline Phosphatase: 33 U/L — ABNORMAL LOW (ref 38–126)
Bilirubin, Direct: 0.2 mg/dL (ref 0.0–0.2)
Indirect Bilirubin: 0.6 mg/dL (ref 0.3–0.9)
Total Bilirubin: 0.8 mg/dL (ref 0.3–1.2)
Total Protein: 6.4 g/dL — ABNORMAL LOW (ref 6.5–8.1)

## 2019-02-03 LAB — PROTIME-INR
INR: 2.7 — ABNORMAL HIGH (ref 0.8–1.2)
Prothrombin Time: 28.6 seconds — ABNORMAL HIGH (ref 11.4–15.2)

## 2019-02-03 LAB — CBC
HCT: 43.8 % (ref 39.0–52.0)
Hemoglobin: 13.6 g/dL (ref 13.0–17.0)
MCH: 31.7 pg (ref 26.0–34.0)
MCHC: 31.1 g/dL (ref 30.0–36.0)
MCV: 102.1 fL — ABNORMAL HIGH (ref 80.0–100.0)
Platelets: 210 10*3/uL (ref 150–400)
RBC: 4.29 MIL/uL (ref 4.22–5.81)
RDW: 17.2 % — ABNORMAL HIGH (ref 11.5–15.5)
WBC: 8.5 10*3/uL (ref 4.0–10.5)
nRBC: 0 % (ref 0.0–0.2)

## 2019-02-03 LAB — BASIC METABOLIC PANEL
Anion gap: 7 (ref 5–15)
BUN: 31 mg/dL — ABNORMAL HIGH (ref 8–23)
CO2: 28 mmol/L (ref 22–32)
Calcium: 8.3 mg/dL — ABNORMAL LOW (ref 8.9–10.3)
Chloride: 105 mmol/L (ref 98–111)
Creatinine, Ser: 2.33 mg/dL — ABNORMAL HIGH (ref 0.61–1.24)
GFR calc Af Amer: 33 mL/min — ABNORMAL LOW (ref >60)
GFR calc non Af Amer: 28 mL/min — ABNORMAL LOW (ref >60)
Glucose, Bld: 263 mg/dL — ABNORMAL HIGH (ref 70–99)
Potassium: 4.3 mmol/L (ref 3.5–5.1)
Sodium: 140 mmol/L (ref 135–145)

## 2019-02-03 LAB — TROPONIN I
Troponin I: <0.03 ng/mL (ref <0.03)
Troponin I: <0.03 ng/mL (ref <0.03)

## 2019-02-03 LAB — TSH: TSH: 1.287 u[IU]/mL (ref 0.350–4.500)

## 2019-02-03 LAB — GLUCOSE, CAPILLARY
Glucose-Capillary: 161 mg/dL — ABNORMAL HIGH (ref 70–99)
Glucose-Capillary: 68 mg/dL — ABNORMAL LOW (ref 70–99)

## 2019-02-03 LAB — MAGNESIUM: Magnesium: 1.7 mg/dL (ref 1.7–2.4)

## 2019-02-03 LAB — BRAIN NATRIURETIC PEPTIDE: B Natriuretic Peptide: 194.2 pg/mL — ABNORMAL HIGH (ref 0.0–100.0)

## 2019-02-03 LAB — SARS CORONAVIRUS 2 BY RT PCR (HOSPITAL ORDER, PERFORMED IN ~~LOC~~ HOSPITAL LAB): SARS Coronavirus 2: NEGATIVE

## 2019-02-03 MED ORDER — ACETAMINOPHEN 325 MG PO TABS
650.0000 mg | ORAL_TABLET | ORAL | Status: DC | PRN
  Administered 2019-02-03 – 2019-02-12 (×9): 650 mg via ORAL
  Filled 2019-02-03 (×10): qty 2

## 2019-02-03 MED ORDER — SODIUM CHLORIDE 0.9 % IV SOLN: 250.0000 mL | INTRAVENOUS | Status: DC | PRN

## 2019-02-03 MED ORDER — ALBUTEROL SULFATE (2.5 MG/3ML) 0.083% IN NEBU: 5.0000 mg | INHALATION_SOLUTION | Freq: Once | RESPIRATORY_TRACT | Status: DC

## 2019-02-03 MED ORDER — FUROSEMIDE 10 MG/ML IJ SOLN
80.0000 mg | Freq: Once | INTRAMUSCULAR | Status: AC
  Administered 2019-02-03: 19:00:00 80 mg via INTRAVENOUS
  Filled 2019-02-03: qty 8

## 2019-02-03 MED ORDER — WARFARIN - PHARMACIST DOSING INPATIENT
Freq: Every day | Status: DC
  Administered 2019-02-04: 18:00:00

## 2019-02-03 MED ORDER — METOLAZONE 2.5 MG PO TABS
2.5000 mg | ORAL_TABLET | Freq: Every day | ORAL | Status: DC
  Administered 2019-02-04 – 2019-02-12 (×8): 2.5 mg via ORAL
  Filled 2019-02-03 (×9): qty 1

## 2019-02-03 MED ORDER — FUROSEMIDE 10 MG/ML IJ SOLN
80.0000 mg | Freq: Two times a day (BID) | INTRAMUSCULAR | Status: DC
  Administered 2019-02-04 – 2019-02-05 (×2): 80 mg via INTRAVENOUS
  Filled 2019-02-03 (×3): qty 8

## 2019-02-03 MED ORDER — SODIUM CHLORIDE 0.9% FLUSH: 3.0000 mL | INTRAVENOUS | Status: DC | PRN

## 2019-02-03 MED ORDER — INSULIN ASPART 100 UNIT/ML ~~LOC~~ SOLN: 0.0000 [IU] | Freq: Every day | SUBCUTANEOUS | Status: DC

## 2019-02-03 MED ORDER — INSULIN NPH (HUMAN) (ISOPHANE) 100 UNIT/ML ~~LOC~~ SUSP
20.0000 [IU] | Freq: Two times a day (BID) | SUBCUTANEOUS | Status: DC
  Administered 2019-02-03: 20 [IU] via SUBCUTANEOUS
  Filled 2019-02-03: qty 10

## 2019-02-03 MED ORDER — GABAPENTIN 300 MG PO CAPS
300.0000 mg | ORAL_CAPSULE | Freq: Three times a day (TID) | ORAL | Status: DC
  Administered 2019-02-03 – 2019-02-12 (×23): 300 mg via ORAL
  Filled 2019-02-03 (×24): qty 1

## 2019-02-03 MED ORDER — MAGNESIUM SULFATE 2 GM/50ML IV SOLN
2.0000 g | Freq: Once | INTRAVENOUS | Status: AC
  Administered 2019-02-03: 2 g via INTRAVENOUS
  Filled 2019-02-03: qty 50

## 2019-02-03 MED ORDER — ONDANSETRON HCL 4 MG/2ML IJ SOLN
4.0000 mg | Freq: Four times a day (QID) | INTRAMUSCULAR | Status: DC | PRN
  Administered 2019-02-04: 4 mg via INTRAVENOUS
  Filled 2019-02-03: qty 2

## 2019-02-03 MED ORDER — ROSUVASTATIN CALCIUM 10 MG PO TABS
10.0000 mg | ORAL_TABLET | Freq: Every day | ORAL | Status: DC
  Administered 2019-02-03 – 2019-02-04 (×2): 10 mg via ORAL
  Filled 2019-02-03 (×2): qty 1

## 2019-02-03 MED ORDER — ORAL CARE MOUTH RINSE
15.0000 mL | Freq: Two times a day (BID) | OROMUCOSAL | Status: DC
  Administered 2019-02-03 – 2019-02-12 (×16): 15 mL via OROMUCOSAL

## 2019-02-03 MED ORDER — SPIRONOLACTONE 25 MG PO TABS
25.0000 mg | ORAL_TABLET | Freq: Two times a day (BID) | ORAL | Status: DC
  Administered 2019-02-03 – 2019-02-12 (×16): 25 mg via ORAL
  Filled 2019-02-03 (×16): qty 1

## 2019-02-03 MED ORDER — INSULIN ASPART 100 UNIT/ML ~~LOC~~ SOLN
0.0000 [IU] | Freq: Three times a day (TID) | SUBCUTANEOUS | Status: DC
  Administered 2019-02-04 (×3): 2 [IU] via SUBCUTANEOUS

## 2019-02-03 MED ORDER — TRAZODONE HCL 100 MG PO TABS
100.0000 mg | ORAL_TABLET | Freq: Every evening | ORAL | Status: DC | PRN
  Administered 2019-02-03 – 2019-02-10 (×2): 100 mg via ORAL
  Filled 2019-02-03 (×2): qty 1

## 2019-02-03 MED ORDER — HYDROCERIN EX CREA
TOPICAL_CREAM | CUTANEOUS | Status: DC | PRN
  Administered 2019-02-04: 09:00:00 via TOPICAL
  Filled 2019-02-03: qty 113

## 2019-02-03 MED ORDER — SODIUM CHLORIDE 0.9% FLUSH
3.0000 mL | Freq: Two times a day (BID) | INTRAVENOUS | Status: DC
  Administered 2019-02-03 – 2019-02-12 (×15): 3 mL via INTRAVENOUS

## 2019-02-03 MED ORDER — POTASSIUM CHLORIDE CRYS ER 20 MEQ PO TBCR
20.0000 meq | EXTENDED_RELEASE_TABLET | Freq: Two times a day (BID) | ORAL | Status: DC
  Administered 2019-02-03 – 2019-02-12 (×16): 20 meq via ORAL
  Filled 2019-02-03 (×16): qty 1

## 2019-02-03 MED ORDER — CARVEDILOL 25 MG PO TABS
25.0000 mg | ORAL_TABLET | Freq: Two times a day (BID) | ORAL | Status: DC
  Administered 2019-02-03 – 2019-02-04 (×3): 25 mg via ORAL
  Filled 2019-02-03 (×3): qty 1

## 2019-02-03 MED ORDER — CYCLOBENZAPRINE HCL 5 MG PO TABS
5.0000 mg | ORAL_TABLET | Freq: Three times a day (TID) | ORAL | Status: DC
  Administered 2019-02-03 – 2019-02-04 (×4): 5 mg via ORAL
  Filled 2019-02-03 (×4): qty 1

## 2019-02-03 NOTE — ED Notes (Signed)
ED TO INPATIENT HANDOFF REPORT  Name/Age/Gender Kurt Cross 68 y.o. male  Code Status Code Status History    Date Active Date Inactive Code Status Order ID Comments User Context   02/13/2018 1607 02/18/2018 2119 Full Code [id number]  Nita Sells, MD ED   01/27/2016 0143 02/06/2016 1804 Full Code [id number]  Toy [name], MD Inpatient   03/17/2014 1347 03/17/2014 2219 Full Code [id number]  Charolette Forward, MD Inpatient   11/12/2013 0158 11/18/2013 1830 Full Code [id number]  [name], DO ED   08/11/2013 1818 10/15/2013 2042 Full Code [id number]  [name], MD ED   08/02/2013 1348 08/05/2013 2027 DNR [id number]  Rama, Venetia Maxon, MD Inpatient   08/01/2013 2240 08/02/2013 1348 Full Code [id number]  [name], MD Inpatient   08/24/2012 1423 08/27/2012 1610 Full Code [id number]  [name], RN Inpatient   06/28/2012 0910 06/29/2012 1911 Full Code [id number]  [name], RN Inpatient      Home/SNF/Other Home  Chief Complaint low oxygen saturation;foot wound  Level of Care/Admitting Diagnosis ED Disposition    ED Disposition Condition [facility] Area: [facility] [[id number]]  Level of Care: Telemetry [5]  Admit to tele based on following criteria: Acute CHF  Covid Evaluation: N/A  Diagnosis: Acute exacerbation of CHF (congestive heart failure) [facility]) [[id number]]  Admitting Physician: [name] [[id number]]  Attending Physician: [name] [[id number]]  Estimated length of stay: past midnight tomorrow  Certification:: I certify this patient will need inpatient services for at least 2 midnights  PT Class (Do Not Modify): Inpatient [101]  PT Acc Code (Do Not Modify): Private [1]       Medical History Past Medical History:  Diagnosis Date  . AAA (abdominal aortic aneurysm)/ 4.5 cm ascending per CT angio 08/01/13 08/12/2013  . Abscess 02/13/2018  . Acute on chronic renal failure (HCC) 01/27/2016   02/15/16 Na  130, K 4.6, Bun 47, creat 2.07    . Acute on chronic respiratory failure ([place]) 01/27/2016  . Acute on chronic respiratory failure with hypoxia ([place]) 01/27/2016   02/14/16 CXR no acute pulmonary abnormality, a nodule density noted at the right upper lobe, could represent a granuloma versus neoplastic lesion, may CT   . Acute renal failure (ARF) ([place]) 08/15/2013  . AKI (acute kidney injury) ([place]) 11/12/2013  . Atrial fibrillation ([place])   . Atrial flutter ([place]) 02/22/2016  . Benign hypertension   . Bradycardia   . BRONCHITIS, CHRONIC 09/01/2007   Qualifier: Diagnosis of  By: Doy Mince LPN, Megan    . Cellulitis   . Cellulitis of left lower extremity 02/14/2018  . CHF (congestive heart failure), NYHA class II ([place])   . Chronic anticoagulation 02/22/2016  . Chronic combined systolic and diastolic CHF (congestive heart failure) ([place]) 08/04/2013   02/14/16 CXR no acute pulmonary abnormality, a nodule density noted at the right upper lobe, could represent a granuloma versus neoplastic lesion, may CT   . Chronic respiratory failure ([place]) 02/10/2016  . Chronic ulcer of left heel ([place]) 02/17/2018  . CKD (chronic kidney disease), stage III ([place]) 02/17/2018  . Complete heart block (HCC)    s/p PPM Bi-Ven  . Constipation 02/07/2016  . Constipation, chronic 08/03/2013  . Debility 02/10/2016  . Decubitus ulcer of sacral region, stage 3 ([place]) 02/14/2018  . Diabetes mellitus, type 2 ([place])   . Elevated troponin 01/27/2016  . Fever 08/01/2013  . H/O medication noncompliance 02/22/2016  . Hernia  of abdominal cavity   . Hyperkalemia 11/12/2013  . Hyperlipidemia   . Hypertension   . Hypokalemia   . Hypoxemia   . Hypoxia 01/26/2016  . 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 Kidney failure   . Leukocytosis 08/11/2013  . Neuropathy   . Obesity, Class III, BMI 40-49.9 (morbid obesity) ([place]) 09/01/2007   Qualifier: Diagnosis of  By: Doy Mince LPN, Megan    . Obstructive  sleep apnea 09/01/2007   Qualifier: Diagnosis of  By: Doy Mince LPN, Megan    . OSA (obstructive sleep apnea)   . Recurrent ventral incisional hernia 08/14/2013  . Right leg pain 08/11/2013  . S/P placement of cardiac pacemaker 02/22/2016  . Sepsis ([place]) 08/02/2013  . Short gut syndrome due to distal jejunal SB fistula 10/22/2013    Allergies No Known Allergies  IV Location/Drains/Wounds Patient Lines/Drains/Airways Status   Active Line/Drains/Airways    Name:   Placement date:   Placement time:   Site:   Days:   Peripheral IV 02/03/19 Right Antecubital   02/03/19    1434    Antecubital   less than 1   Closed System Drain   08/18/13    -    -   1995   External Urinary Catheter   02/12/18    2125    -   356   Incision  Abdomen Right;Lower   -    -        Pressure Injury 02/13/18 Unstageable - Full thickness tissue loss in which the base of the ulcer is covered by slough (yellow, tan, gray, green or brown) and/or eschar (tan, brown or black) in the wound bed. Left Heel   02/13/18    2130     355   Pressure Injury 02/13/18 Deep Tissue Injury - Purple or maroon localized area of discolored intact skin or blood-filled blister due to damage of underlying soft tissue from pressure and/or shear. Left Foot Near Toe   02/13/18    2130     355   Pressure Injury 02/13/18 Deep Tissue Injury - Purple or maroon localized area of discolored intact skin or blood-filled blister due to damage of underlying soft tissue from pressure and/or shear. Right heel   02/13/18    2130     355   Pressure Injury 02/15/18 Stage III -  Full thickness tissue loss. Subcutaneous fat may be visible but bone, tendon or muscle are NOT exposed. small,circular area w/slight depth under foam dressing with yellow Subq tissue visible   02/15/18    1048     353   Wound 08/01/13 Other (Comment) Leg Right;Left;Lower discoloration & scaring with dried skin to BLE   08/01/13    2230    Leg   2012   Wound / Incision (Open or Dehisced) 01/27/16  Other (Comment) Leg Right;Left;Lower   01/27/16    0140    Leg   1103          Labs/Imaging Results for orders placed or performed during the hospital encounter of 02/03/19 (from the past 48 hour(s))  Basic metabolic panel     Status: Abnormal   Collection Time: 02/03/19  2:35 PM  Result Value Ref Range   Sodium 140 135 - 145 mmol/L   Potassium 4.3 3.5 - 5.1 mmol/L   Chloride 105 98 - 111 mmol/L   CO2 28 22 - 32 mmol/L   Glucose, Bld 263 (H) 70 -  99 mg/dL   BUN 31 (H) 8 - 23 mg/dL   Creatinine, Ser 2.33 (H) 0.61 - 1.24 mg/dL   Calcium 8.3 (L) 8.9 - 10.3 mg/dL   GFR calc non Af Amer 28 (L) >60 mL/min   GFR calc Af Amer 33 (L) >60 mL/min   Anion gap 7 5 - 15    Comment: Performed at [facility], [place] [address] [place], [place] [address]  CBC     Status: Abnormal   Collection Time: 02/03/19  2:35 PM  Result Value Ref Range   WBC 8.5 4.0 - 10.5 K/uL   RBC 4.29 4.22 - 5.81 MIL/uL   Hemoglobin 13.6 13.0 - 17.0 g/dL   HCT 43.8 39.0 - 52.0 %   MCV 102.1 (H) 80.0 - 100.0 fL   MCH 31.7 26.0 - 34.0 pg   MCHC 31.1 30.0 - 36.0 g/dL   RDW 17.2 (H) 11.5 - 15.5 %   Platelets 210 150 - 400 K/uL   nRBC 0.0 0.0 - 0.2 %    Comment: Performed at [facility], [place] [address] [place], [place] [address]  Brain natriuretic peptide     Status: Abnormal   Collection Time: 02/03/19  2:35 PM  Result Value Ref Range   B Natriuretic Peptide 194.2 (H) 0.0 - 100.0 pg/mL    Comment: Performed at [facility], [place] [address] [place], [place] [address]  Hepatic function panel     Status: Abnormal   Collection Time: 02/03/19  2:35 PM  Result Value Ref Range   Total Protein 6.4 (L) 6.5 - 8.1 g/dL   Albumin 2.7 (L) 3.5 - 5.0 g/dL   AST 12 (L) 15 - 41 U/L   ALT 10 0 - 44 U/L   Alkaline Phosphatase 33 (L) 38 - 126 U/L   Total Bilirubin 0.8 0.3 - 1.2 mg/dL   Bilirubin, Direct 0.2 0.0 - 0.2 mg/dL   Indirect Bilirubin 0.6 0.3 - 0.9 mg/dL     Comment: Performed at [facility], [place] [address] [place], [place] [address]  Protime-INR     Status: Abnormal   Collection Time: 02/03/19  2:35 PM  Result Value Ref Range   Prothrombin Time 28.6 (H) 11.4 - 15.2 seconds   INR 2.7 (H) 0.8 - 1.2    Comment: (NOTE) INR goal varies based on device and disease states. Performed at [facility], [place] [address] [place], [place] [address]   Troponin I - Add-On to previous collection     Status: None   Collection Time: 02/03/19  2:35 PM  Result Value Ref Range   Troponin I <0.03 <0.03 ng/mL    Comment: Performed at [facility], [place] [address], [place], [place] [address]  SARS Coronavirus 2 [facility] order, Performed in [facility] hospital lab)     Status: None   Collection Time: 02/03/19  3:27 PM  Result Value Ref Range   SARS Coronavirus 2 NEGATIVE NEGATIVE    Comment: (NOTE) If result is NEGATIVE SARS-CoV-2 target nucleic acids are NOT DETECTED. The SARS-CoV-2 RNA is generally detectable in upper and lower  respiratory specimens during the acute phase of infection. The lowest  concentration of SARS-CoV-2 viral copies this assay can detect is 250  copies / mL. A negative result does not preclude SARS-CoV-2 infection  and should not be used as the sole basis for treatment or other  patient management decisions.  A negative result  may occur with  improper specimen collection / handling, submission of specimen other  than nasopharyngeal swab, presence of viral mutation(s) within the  areas targeted by this assay, and inadequate number of viral copies  (<250 copies / mL). A negative result must be combined with clinical  observations, patient history, and epidemiological information. If result is POSITIVE SARS-CoV-2 target nucleic acids are DETECTED. The SARS-CoV-2 RNA is generally detectable in upper and lower  respiratory specimens dur ing the acute phase of infection.   Positive  results are indicative of active infection with SARS-CoV-2.  Clinical  correlation with patient history and other diagnostic information is  necessary to determine patient infection status.  Positive results do  not rule out bacterial infection or co-infection with other viruses. If result is PRESUMPTIVE POSTIVE SARS-CoV-2 nucleic acids MAY BE PRESENT.   A presumptive positive result was obtained on the submitted specimen  and confirmed on repeat testing.  While 2019 novel coronavirus  (SARS-CoV-2) nucleic acids may be present in the submitted sample  additional confirmatory testing may be necessary for epidemiological  and / or clinical management purposes  to differentiate between  SARS-CoV-2 and other Sarbecovirus currently known to infect humans.  If clinically indicated additional testing with an alternate test  methodology [phone]) is advised. The SARS-CoV-2 RNA is generally  detectable in upper and lower respiratory sp ecimens during the acute  phase of infection. The expected result is Negative. Fact Sheet for Patients:  StrictlyIdeas.no Fact Sheet for Healthcare Providers: BankingDealers.co.za This test is not yet approved or cleared by the [place] FDA and has been authorized for detection and/or diagnosis of SARS-CoV-2 by FDA under an Emergency Use Authorization (EUA).  This EUA will remain in effect (meaning this test can be used) for the duration of the COVID-19 declaration under Section 564(b)(1) of the Act, 21 U.S.C. section 360bbb-3(b)(1), unless the authorization is terminated or revoked sooner. Performed at [facility], [place] [address], [place], [place] [address]   Magnesium     Status: None   Collection Time: 02/03/19  3:30 PM  Result Value Ref Range   Magnesium 1.7 1.7 - 2.4 mg/dL    Comment: Performed at [facility], [place] [address] [place],  [address]    Dg Chest 2 View  Result Date: 02/03/2019 CLINICAL DATA:  Shortness of breath.  Hypoxia. EXAM: CHEST - 2 VIEW COMPARISON:  Chest x-[place]liv[place] dated 02/13/2018 and 01/26/2016 FINDINGS: Chronic cardiomegaly pacemaker in place. Slight new pulmonary vascular prominence. On the lateral view there is suggestion of posteriorly layered pleural effusions. No acute bone abnormality. IMPRESSION: 1. Chronic cardiomegaly. 2. New slight pulmonary vascular prominence with probable posterior pleural effusions. Electronically Signed   By: Lorriane Shire M.D.   On: 02/03/2019 15:36   Dg Foot Complete Left  Result Date: 02/03/2019 CLINICAL DATA:  Chronic heel wound. EXAM: LEFT FOOT - COMPLETE 3+ VIEW COMPARISON:  02/13/2018 FINDINGS: There is diffuse osteopenia. Slight chronic arthritic changes of the first MTP joint. Chronic arthritic changes of the ankle joint. Soft tissue swelling of the foot primarily on the dorsum of the forefoot. Slight dorsal spurring in the midfoot. No evidence of osteomyelitis. Chronic calcifications in the distal Achilles tendon. IMPRESSION: 1. No acute bone abnormalities. 2. No discrete osteomyelitis. 3. New soft tissue swelling of the foot. Electronically Signed   By: Lorriane Shire M.D.   On: 02/03/2019 15:38    Pending Labs Unresulted Labs (From admission, onward)   None  Vitals/Pain Today's Vitals   02/03/19 1530 02/03/19 1600 02/03/19 1630 02/03/19 1700  BP: (!) 154/105 (!) 140/93 (!) 153/96 137/75  Pulse: 69 70 71   Resp: 20 19 16  (!) 22  Temp:      TempSrc:      SpO2: 100% 100% 98%   Weight:      Height:        Isolation Precautions Droplet and Contact precautions  Medications Medications  furosemide (LASIX) injection 80 mg (has no administration in time range)    Mobility non-ambulatory

## 2019-02-03 NOTE — Progress Notes (Signed)
CRITICAL VALUE ALERT  Critical Value:  CBG 68  Date & Time Notied:02/03/19; 2135  Provider Notified: Yes  Orders Received/Actions taken: Gave patient 15 gm carb. snack

## 2019-02-03 NOTE — H&P (Signed)
History and Physical    DEVLON DOSHER ERD:[id number] DOB: Jan 18, 1951 DOA: 02/03/2019  PCP: [name], MD  Patient coming from: Home  I have personally briefly reviewed patient's old medical records in [place]  Chief Complaint: Shortness of breath  HPI: Kurt Cross is a 68 y.o. male with medical history significant of chronic systolic heart failure, A. fib/a flutter, AAA, chronic kidney disease stage III, complete heart block status post PPM, chronic constipation, type 2 diabetes, chronic heel wound who presents to the ED with a one-week history of worsening shortness of breath and worsening lower extremity edema.  Patient states lower extremity edema has been ongoing for greater than 1 week.  Patient also endorses a 8 to 10 pound weight gain over the past 2 weeks.  Patient denies any fevers, no chest pain, no nausea, no cough, no melena, no hematemesis, no hematochezia.  Patient does endorse a bout of emesis 1 week prior to admission.  Patient denies any diarrhea, no constipation.  Patient does endorse some soft stools.  Patient denies any abdominal pain.  No syncopal episodes, no dietary indiscretions.  Patient does endorse some dysuria.  Patient is noted in his chart has a history of medical noncompliance patient states has missed 1 weeks worth of his Crestor and furosemide stating he ran out of his medications.  Patient also endorses decreased oral intake and some insomnia.  Patient stated that his wife made him come to the ED.  ED Course: Patient seen in the ED, comprehensive metabolic profile with a glucose of 263, BUN of 31, creatinine of 2.33, albumin of 2.7 otherwise was within normal limits.  BNP of 194.2.  Troponin was negative.  CBC unremarkable.  INR of 2.7.  COVID-19 was negative.  Chest x-[place] done with chronic cardiomegaly, new slight pulmonary vascular prominence with probable posterior pleural effusions.  Plain films of the left foot negative for any acute  abnormalities.  EKG done with a paced rhythm.  Patient given a dose of Lasix 80 mg IV x1.  Hospitalist were called to admit the patient for further evaluation and management.  Review of Systems: As per HPI otherwise 10 point review of systems negative.   Past Medical History:  Diagnosis Date  . AAA (abdominal aortic aneurysm)/ 4.5 cm ascending per CT angio 08/01/13 08/12/2013  . Abscess 02/13/2018  . Acute on chronic renal failure (HCC) 01/27/2016   02/15/16 Na 130, K 4.6, Bun 47, creat 2.07    . Acute on chronic respiratory failure ([place]) 01/27/2016  . Acute on chronic respiratory failure with hypoxia ([place]) 01/27/2016   02/14/16 CXR no acute pulmonary abnormality, a nodule density noted at the right upper lobe, could represent a granuloma versus neoplastic lesion, may CT   . Acute renal failure (ARF) ([place]) 08/15/2013  . AKI (acute kidney injury) ([place]) 11/12/2013  . Atrial fibrillation ([place])   . Atrial flutter ([place]) 02/22/2016  . Benign hypertension   . Bradycardia   . BRONCHITIS, CHRONIC 09/01/2007   Qualifier: Diagnosis of  By: Doy Mince LPN, Megan    . Cellulitis   . Cellulitis of left lower extremity 02/14/2018  . CHF (congestive heart failure), NYHA class II ([place])   . Chronic anticoagulation 02/22/2016  . Chronic combined systolic and diastolic CHF (congestive heart failure) ([place]) 08/04/2013   02/14/16 CXR no acute pulmonary abnormality, a nodule density noted at the right upper lobe, could represent a granuloma versus neoplastic lesion, may CT   . Chronic respiratory failure (  [place]) 02/10/2016  . Chronic ulcer of left heel ([place]) 02/17/2018  . CKD (chronic kidney disease), stage III ([place]) 02/17/2018  . Complete heart block (HCC)    s/p PPM Bi-Ven  . Constipation 02/07/2016  . Constipation, chronic 08/03/2013  . Debility 02/10/2016  . Decubitus ulcer of sacral region, stage 3 ([place]) 02/14/2018  . Diabetes mellitus, type 2 ([place])   . Elevated troponin 01/27/2016  . Fever 08/01/2013  . H/O medication  noncompliance 02/22/2016  . Hernia of abdominal cavity   . Hyperkalemia 11/12/2013  . Hyperlipidemia   . Hypertension   . Hypokalemia   . Hypoxemia   . Hypoxia 01/26/2016  . 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 Kidney failure   . Leukocytosis 08/11/2013  . Neuropathy   . Obesity, Class III, BMI 40-49.9 (morbid obesity) ([place]) 09/01/2007   Qualifier: Diagnosis of  By: Doy Mince LPN, Megan    . Obstructive sleep apnea 09/01/2007   Qualifier: Diagnosis of  By: Doy Mince LPN, Megan    . OSA (obstructive sleep apnea)   . Recurrent ventral incisional hernia 08/14/2013  . Right leg pain 08/11/2013  . S/P placement of cardiac pacemaker 02/22/2016  . Sepsis ([place]) 08/02/2013  . Short gut syndrome due to distal jejunal SB fistula 10/22/2013    Past Surgical History:  Procedure Laterality Date  . APPENDECTOMY    . biventricular pacemaker generator change  04/29/2014   MDT Viva CRT-P BiV pacemaker implanted by Dr Rolla Etienne in [place]  . biventricular pacemaker implantation  06/18/2007   MDT BIV pacemaker implanted by Dr Lovena Le  . BOWEL RESECTION  1980s?   ?colectomy/ostomy - Dr Elesa Hacker  . INCISION AND DRAINAGE ABSCESS N/A 08/14/2013   Procedure: INCISION AND DRAINAGE ABSCESS, mesh ;  Surgeon: [name], MD;  Location: [facility];  Service: General;  Laterality: N/A;  . [place]?   Dr. Lindon Romp - w mesh  . KNEE SURGERY Right   . LEFT HEART CATHETERIZATION WITH CORONARY ANGIOGRAM N/A 03/17/2014   Procedure: LEFT HEART CATHETERIZATION WITH CORONARY ANGIOGRAM;  Surgeon: [name], MD;  Location: [facility];  Service: Cardiovascular;  Laterality: N/A;  . NASAL SEPTUM SURGERY    . OSTOMY TAKE DOWN  1980s?   Dr Elesa Hacker  . TONSILLECTOMY AND ADENOIDECTOMY       reports that he quit smoking about 6 years ago. His smoking use included cigarettes. He has a 40.00 pack-year smoking history. He has never used smokeless  tobacco. He reports current alcohol use. He reports that he does not use drugs.  No Known Allergies  Family History  Problem Relation Age of Onset  . Diabetes Mother   . Alcoholism Father   . Heart Problems Maternal Grandmother    Mother deceased age 55 from alcoholism.  Father deceased patient not sure of the cause as he states he was not in contact with his father.  Prior to Admission medications   Medication Sig Start Date End Date Taking? Authorizing Provider  carvedilol (COREG) 25 MG tablet Take 25 mg by mouth 2 (two) times daily. 07/25/14  Yes [provider]  cyclobenzaprine (FLEXERIL) 5 MG tablet Take 5 mg by mouth 3 (three) times daily.   Yes [provider]  furosemide (LASIX) 80 MG tablet Take 80 mg by mouth daily.    Yes [provider]  gabapentin (NEURONTIN) 300 MG capsule Take 300 mg by mouth 3 (three) times  daily.  10/17/17  Yes [provider]  insulin NPH Human (HUMULIN N,NOVOLIN N) 100 UNIT/ML injection Inject 20 units in the morning and 20 units in the evening.   Yes [provider]  insulin regular (NOVOLIN R,HUMULIN R) 100 units/mL injection Use with sliding scale.   Yes [provider]  KLOR-CON M20 20 MEQ tablet Take 20 mg by mouth 2 (two) times daily. 01/06/18  Yes [provider]  metolazone (ZAROXOLYN) 2.5 MG tablet Take 2.5 mg by mouth daily.   Yes [provider]  rosuvastatin (CRESTOR) 10 MG tablet Take 10 mg by mouth daily.   Yes [provider]  traZODone (DESYREL) 50 MG tablet Take 100 mg by mouth at bedtime as needed for sleep.  06/29/18  Yes [provider]  warfarin (COUMADIN) 7.5 MG tablet Take 7.5 mg by mouth daily. 09/25/18  Yes [provider]  Adhesive Tape (ADHESIVE PAPER) TAPE To use with pads/dressing. 12/08/18   [name], MD  Adhesive Tape TAPE To use with dressings/pads. 12/08/18   [name], MD  Blood Glucose Monitoring Suppl  (ACCU-CHEK AVIVA) device Use as instructed 08/25/18 08/25/19  [name], MD  Disposable Gloves MISC To use with wound dressing/changing. 12/08/18   [name], MD  Gauze Bandages MISC To use daily. 12/08/18   [name], MD  Gauze Pads & Dressings (ABDOMINAL PAD) 8"X10" PADS 1 each by Does not apply route as needed. 12/08/18   [name], MD  Gauze Pads & Dressings (AMD FOAM DRESSING) 4"X4" PADS To use daily. 12/08/18   [name], MD  Gauze Pads & Dressings [facility] BANDAGE ROLL) MISC To use daily. 12/08/18   [name], MD  Skin Protectants, Misc. (EUCERIN) cream Apply topically as needed for wound care. 12/08/18   [name], MD  spironolactone (ALDACTONE) 25 MG tablet Take 1 tablet (25 mg total) by mouth 2 (two) times daily. Patient not taking: Reported on 02/03/2019 09/09/18   [name], MD    Physical Exam: Vitals:   02/03/19 1600 02/03/19 1630 02/03/19 1700 02/03/19 1800  BP: (!) 140/93 (!) 153/96 137/75 (!) 148/86  Pulse: 70 71  70  Resp: 19 16 (!) 22 19  Temp:      TempSrc:      SpO2: 100% 98%  97%  Weight:      Height:        Constitutional: NAD, calm, comfortable Vitals:   02/03/19 1600 02/03/19 1630 02/03/19 1700 02/03/19 1800  BP: (!) 140/93 (!) 153/96 137/75 (!) 148/86  Pulse: 70 71  70  Resp: 19 16 (!) 22 19  Temp:      TempSrc:      SpO2: 100% 98%  97%  Weight:      Height:       Eyes: PERRL, lids and conjunctivae normal ENMT: Mucous membranes are dry. Posterior pharynx clear of any exudate or lesions. Poor dentition.  Neck: normal, supple, no masses, no thyromegaly Respiratory: Decreased breath sounds in the bases.  Some diffuse crackles/Rales bilaterally.  No rhonchi.  No wheezing noted.  Speaking in full sentences.   Cardiovascular: Irregularly irregular.  2-3+ bilateral lower extremity edema left greater than right.  No carotid bruits.  Abdomen: no tenderness, no masses  palpated. No hepatosplenomegaly. Bowel sounds positive.  Musculoskeletal: no clubbing / cyanosis. No joint deformity upper and lower extremities. Good ROM, no contractures. Normal muscle tone.  Skin: Right heel pressure  wound, with minimal erythema, no drainage.  Neurologic: CN 2-12 grossly intact. Sensation intact, DTR normal. Strength 5/5 in all 4.  Psychiatric: Normal judgment and insight. Alert and oriented x 3. Normal mood.    Labs on Admission: I have personally reviewed following labs and imaging studies  CBC: Recent Labs  Lab 02/03/19 1435  WBC 8.5  HGB 13.6  HCT 43.8  MCV 102.1*  PLT 144   Basic Metabolic Panel: Recent Labs  Lab 02/03/19 1435 02/03/19 1530  NA 140  --   K 4.3  --   CL 105  --   CO2 28  --   GLUCOSE 263*  --   BUN 31*  --   CREATININE 2.33*  --   CALCIUM 8.3*  --   MG  --  1.7   GFR: Estimated Creatinine Clearance: 43.6 mL/min (A) (by C-G formula based on SCr of 2.33 mg/dL (H)). Liver Function Tests: Recent Labs  Lab 02/03/19 1435  AST 12*  ALT 10  ALKPHOS 33*  BILITOT 0.8  PROT 6.4*  ALBUMIN 2.7*   No results for input(s): LIPASE, AMYLASE in the last 168 hours. No results for input(s): AMMONIA in the last 168 hours. Coagulation Profile: Recent Labs  Lab 02/03/19 1435  INR 2.7*   Cardiac Enzymes: Recent Labs  Lab 02/03/19 1435  TROPONINI <0.03   BNP (last 3 results) No results for input(s): PROBNP in the last 8760 hours. HbA1C: No results for input(s): HGBA1C in the last 72 hours. CBG: No results for input(s): GLUCAP in the last 168 hours. Lipid Profile: No results for input(s): CHOL, HDL, LDLCALC, TRIG, CHOLHDL, LDLDIRECT in the last 72 hours. Thyroid Function Tests: No results for input(s): TSH, T4TOTAL, FREET4, T3FREE, THYROIDAB in the last 72 hours. Anemia Panel: No results for input(s): VITAMINB12, FOLATE, FERRITIN, TIBC, IRON, RETICCTPCT in the last 72 hours. Urine analysis:    Component Value Date/Time    COLORURINE YELLOW 02/13/2018 1658   APPEARANCEUR TURBID (A) 02/13/2018 1658   LABSPEC 1.010 02/13/2018 1658   PHURINE 5.0 02/13/2018 1658   GLUCOSEU >=500 (A) 02/13/2018 1658   HGBUR MODERATE (A) 02/13/2018 1658   BILIRUBINUR NEGATIVE 02/13/2018 [place] 02/13/2018 1658   PROTEINUR 100 (A) 02/13/2018 1658   UROBILINOGEN 0.2 11/13/2013 0630   NITRITE NEGATIVE 02/13/2018 1658   LEUKOCYTESUR LARGE (A) 02/13/2018 1658    Radiological Exams on Admission: Dg Chest 2 View  Result Date: 02/03/2019 CLINICAL DATA:  Shortness of breath.  Hypoxia. EXAM: CHEST - 2 VIEW COMPARISON:  Chest x-[place] dated 02/13/2018 and 01/26/2016 FINDINGS: Chronic cardiomegaly pacemaker in place. Slight new pulmonary vascular prominence. On the lateral view there is suggestion of posteriorly layered pleural effusions. No acute bone abnormality. IMPRESSION: 1. Chronic cardiomegaly. 2. New slight pulmonary vascular prominence with probable posterior pleural effusions. Electronically Signed   By: Lorriane Shire M.D.   On: 02/03/2019 15:36   Dg Foot Complete Left  Result Date: 02/03/2019 CLINICAL DATA:  Chronic heel wound. EXAM: LEFT FOOT - COMPLETE 3+ VIEW COMPARISON:  02/13/2018 FINDINGS: There is diffuse osteopenia. Slight chronic arthritic changes of the first MTP joint. Chronic arthritic changes of the ankle joint. Soft tissue swelling of the foot primarily on the dorsum of the forefoot. Slight dorsal spurring in the midfoot. No evidence of osteomyelitis. Chronic calcifications in the distal Achilles tendon. IMPRESSION: 1. No acute bone abnormalities. 2. No discrete osteomyelitis. 3. New soft tissue swelling of the foot. Electronically Signed   By: Jeneen Rinks  Maxwell M.D.   On: 02/03/2019 15:38    EKG: Independently reviewed.  Ventricularly paced.  Assessment/Plan Principal Problem:   Acute exacerbation of CHF (congestive heart failure) (HCC) Active Problems:   Diabetes mellitus, insulin dependent (IDDM),  uncontrolled ([place])   Hyperlipidemia   Obesity, Class III, BMI 40-49.9 (morbid obesity) ([place])   Obstructive sleep apnea   Essential hypertension   Chronic combined systolic and diastolic CHF (congestive heart failure) (HCC)   AAA (abdominal aortic aneurysm)/ 4.5 cm ascending per CT angio 08/01/13   Atrial fibrillation/flutter   Debility   S/P placement of cardiac pacemaker   Chronic anticoagulation   CKD (chronic kidney disease), stage III (HCC)   Diabetic ulcer of ankle associated with type 2 diabetes mellitus, limited to breakdown of skin ([place])    1. acute on chronic systolic heart failure exacerbation Patient presenting with shortness of breath x1 week, worsening lower extremity edema greater than a week, 8 to 10 pound weight gain over the past 2 weeks.  Patient afebrile.  No productive cough.  Questionable etiology.  May be secondary to medical noncompliance as patient does state he ran out of his furosemide and Crestor for greater than a week.  Per pharmacy tech patient noted to have refills of his furosemide with his medications.  First set of troponin negative.  COVID-19 tested in the ED was negative.  Check a TSH.  Cycle cardiac enzymes every 6 hours x2.  EKG is ventricularly paced.  Check a 2D echo.  Placed on Lasix 80 mg IV every 12 hours.  Continue home regimen of spironolactone, Coreg, Zaroxolyn.  Will consult with cardiology in the morning.  2.  Diabetes mellitus type 2 Check a hemoglobin A1c.  Resume home regimen of NPH 20 units twice daily.  Place on sliding scale insulin.  Continue home regimen Neurontin.  Follow.  3.  Chronic A. fib/a flutter/status post PPM. Continue Coreg for rate control.  INR therapeutic at 2.7.  Coumadin per pharmacy.  Check a magnesium and keep magnesium greater than 2.  4.  Hyperlipidemia Check a fasting lipid panel.  Continue statin.  5.  Chronic left heel wound Seems to be healing.  No drainage noted.  Wound care.  6.  Chronic kidney disease  stage III Seems to be at baseline.  Monitor closely with diuresis.  Follow.  7.  Obesity  DVT prophylaxis: Coumadin Code Status: DNR Family Communication: Updated patient.  No family at bedside. Disposition Plan: To be determined. Consults called: Cardiology via epic. Admission status: Admit to inpatient/telemetry.   Irine Seal MD [facility]  If 7PM-7AM, please contact night-coverage [email or web address]  02/03/2019, 6:33 PM

## 2019-02-03 NOTE — Telephone Encounter (Signed)
Called because based on last appointment Dr Ethelene Hal wanted to see him again around 03/03/2019 for a 3 month follow up, left message.

## 2019-02-03 NOTE — Progress Notes (Signed)
[place] for warfarin Indication: Afib/flutter  No Known Allergies  Patient Measurements: Height: 5\' 11"  (180.3 cm) Weight: 289 lb (131.1 kg) IBW/kg (Calculated) : 75.3  Vital Signs: Temp: 97.7 F (36.5 C) (04/29 1431) Temp Source: Oral (04/29 1431) BP: 148/86 (04/29 1800) Pulse Rate: 70 (04/29 1800)  Labs: Recent Labs    02/03/19 1435  HGB 13.6  HCT 43.8  PLT 210  LABPROT 28.6*  INR 2.7*  CREATININE 2.33*  TROPONINI <0.03    Estimated Creatinine Clearance: 43.6 mL/min (A) (by C-G formula based on SCr of 2.33 mg/dL (H)).   Medical History: Past Medical History:  Diagnosis Date  . AAA (abdominal aortic aneurysm)/ 4.5 cm ascending per CT angio 08/01/13 08/12/2013  . Abscess 02/13/2018  . Acute on chronic renal failure (HCC) 01/27/2016   02/15/16 Na 130, K 4.6, Bun 47, creat 2.07    . Acute on chronic respiratory failure ([place]) 01/27/2016  . Acute on chronic respiratory failure with hypoxia ([place]) 01/27/2016   02/14/16 CXR no acute pulmonary abnormality, a nodule density noted at the right upper lobe, could represent a granuloma versus neoplastic lesion, may CT   . Acute renal failure (ARF) ([place]) 08/15/2013  . AKI (acute kidney injury) ([place]) 11/12/2013  . Atrial fibrillation ([place])   . Atrial flutter ([place]) 02/22/2016  . Benign hypertension   . Bradycardia   . BRONCHITIS, CHRONIC 09/01/2007   Qualifier: Diagnosis of  By: Doy Mince LPN, Megan    . Cellulitis   . Cellulitis of left lower extremity 02/14/2018  . CHF (congestive heart failure), NYHA class II ([place])   . Chronic anticoagulation 02/22/2016  . Chronic combined systolic and diastolic CHF (congestive heart failure) ([place]) 08/04/2013   02/14/16 CXR no acute pulmonary abnormality, a nodule density noted at the right upper lobe, could represent a granuloma versus neoplastic lesion, may CT   . Chronic respiratory failure ([place]) 02/10/2016  . Chronic ulcer of left heel ([place]) 02/17/2018  . CKD  (chronic kidney disease), stage III ([place]) 02/17/2018  . Complete heart block (HCC)    s/p PPM Bi-Ven  . Constipation 02/07/2016  . Constipation, chronic 08/03/2013  . Debility 02/10/2016  . Decubitus ulcer of sacral region, stage 3 ([place]) 02/14/2018  . Diabetes mellitus, type 2 ([place])   . Elevated troponin 01/27/2016  . Fever 08/01/2013  . H/O medication noncompliance 02/22/2016  . Hernia of abdominal cavity   . Hyperkalemia 11/12/2013  . Hyperlipidemia   . Hypertension   . Hypokalemia   . Hypoxemia   . Hypoxia 01/26/2016  . 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 Kidney failure   . Leukocytosis 08/11/2013  . Neuropathy   . Obesity, Class III, BMI 40-49.9 (morbid obesity) ([place]) 09/01/2007   Qualifier: Diagnosis of  By: Doy Mince LPN, Megan    . Obstructive sleep apnea 09/01/2007   Qualifier: Diagnosis of  By: Doy Mince LPN, Megan    . OSA (obstructive sleep apnea)   . Recurrent ventral incisional hernia 08/14/2013  . Right leg pain 08/11/2013  . S/P placement of cardiac pacemaker 02/22/2016  . Sepsis ([place]) 08/02/2013  . Short gut syndrome due to distal jejunal SB fistula 10/22/2013    Medications:  Medications Prior to Admission  Medication Sig Dispense Refill Last Dose  . carvedilol (COREG) 25 MG tablet Take 25 mg by mouth 2 (two) times daily.  3 Past Month at Unknown time  . cyclobenzaprine (FLEXERIL) 5 MG tablet  Take 5 mg by mouth 3 (three) times daily.   Past Month at Unknown time  . furosemide (LASIX) 80 MG tablet Take 80 mg by mouth daily.    Past Month at Unknown time  . gabapentin (NEURONTIN) 300 MG capsule Take 300 mg by mouth 3 (three) times daily.    Past Month at Unknown time  . insulin NPH Human (HUMULIN N,NOVOLIN N) 100 UNIT/ML injection Inject 20 units in the morning and 20 units in the evening.   Past Month at Unknown time  . insulin regular (NOVOLIN R,HUMULIN R) 100 units/mL injection Use with sliding scale.   Past Month  at Unknown time  . KLOR-CON M20 20 MEQ tablet Take 20 mg by mouth 2 (two) times daily.  3 Past Month at Unknown time  . metolazone (ZAROXOLYN) 2.5 MG tablet Take 2.5 mg by mouth daily.   Past Month at Unknown time  . rosuvastatin (CRESTOR) 10 MG tablet Take 10 mg by mouth daily.   Past Month at Unknown time  . traZODone (DESYREL) 50 MG tablet Take 100 mg by mouth at bedtime as needed for sleep.   3 Past Month at Unknown time  . warfarin (COUMADIN) 7.5 MG tablet Take 7.5 mg by mouth daily.   Past Month at Unknown time  . Adhesive Tape (ADHESIVE PAPER) TAPE To use with pads/dressing. 100 each 5   . Adhesive Tape TAPE To use with dressings/pads. 100 each 5   . Blood Glucose Monitoring Suppl (ACCU-CHEK AVIVA) device Use as instructed 1 each 0 Taking  . Disposable Gloves MISC To use with wound dressing/changing. 100 each 5   . Gauze Bandages MISC To use daily. 100 each 5   . Gauze Pads & Dressings (ABDOMINAL PAD) 8"X10" PADS 1 each by Does not apply route as needed. 100 each 5   . Gauze Pads & Dressings (AMD FOAM DRESSING) 4"X4" PADS To use daily. 100 each 5   . Gauze Pads & Dressings ([place]ER[place]IX BANDAGE ROLL) MISC To use daily. 100 each 5   . Skin Protectants, Misc. (EUCERIN) cream Apply topically as needed for wound care. 397 g 5   . spironolactone (ALDACTONE) 25 MG tablet Take 1 tablet (25 mg total) by mouth 2 (two) times daily. (Patient not taking: Reported on 02/03/2019) 30 tablet 0 Not Taking at Unknown time   Scheduled:  . carvedilol  25 mg Oral BID  . cyclobenzaprine  5 mg Oral TID  . [START ON 02/04/2019] furosemide  80 mg Intravenous Q12H  . gabapentin  300 mg Oral TID  . insulin aspart  0-5 Units Subcutaneous QHS  . [START ON 02/04/2019] insulin aspart  0-9 Units Subcutaneous TID WC  . insulin NPH Human  20 Units Subcutaneous BID AC & HS  . metolazone  2.5 mg Oral Daily  . potassium chloride SA  20 mEq Oral BID  . rosuvastatin  10 mg Oral Daily  . sodium chloride flush  3 mL Intravenous  Q12H  . spironolactone  25 mg Oral BID   PRN: sodium chloride, acetaminophen, eucerin, ondansetron (ZOFRAN) IV, sodium chloride flush, traZODone  Assessment: 56 yoM with PMH sCHF, Afib/flutter on warfarin, AAA, CKD3, complete HB s/p pacer, DM2, admitted for AECHF. Pharmacy to continue warfarin.    Baseline INR 2.7  Prior anticoagulation: warfarin 7.5 mg daily, LD 4/29 - some confusion about last dose since patient has history of noncompliance with other meds, is currently altered and changed his story during the admission. However, given INR  2.7, d/w TRH MD and felt reasonably confident that dose was taken today  Significant events:  Today, 02/03/2019:  CBC: WNL  INR therapeutic  Major drug interactions: none currently  No bleeding issues per nursing  Diet ordered  Goal of Therapy: INR 2-3  Plan:  No warfarin tonight. In the unlikely event the patient did not take a dose today, with INR 2.7, should be enough headroom to drop slightly without becoming grossly subtherapeutic  Daily INR  CBC at least q72 hr while on warfarin  Monitor for signs of bleeding or thrombosis   Reuel Boom, PharmD, BCPS [phone] 02/03/2019, 7:16 PM

## 2019-02-03 NOTE — ED Notes (Signed)
Patient in [place]

## 2019-02-03 NOTE — ED Triage Notes (Signed)
Patient BIB EMS from home. Patient has history of COPD and DM. Patient has home wound care for a wound on his left LE. Wound care nurse found patient to be a little confused upon arrival for wound care today. Patient does not live on oxygen- but was found to have low oxygen saturations in the 80s percentile. Upon EMS arrival, patient placed on 2LNC and oxygen saturations jumped to 96%. Patient complaints of increasing SOB and cough x3 days. Patient denies fever. Patient denies being around anyone sick. Patient has new edema to bilateral lower extremities. Per EMS, patient lung sounds sound clear but diminished. Patient is in paced rhythm, per EMS EKG. CBG = 404

## 2019-02-03 NOTE — ED Provider Notes (Signed)
Patient signed to me by Dr. Alvino Chapel.  Patient clinically has CHF.  Will give Lasix here and consult hospitalist for admission   Lacretia Leigh, MD 02/03/19 1739

## 2019-02-03 NOTE — ED Provider Notes (Signed)
[facility] DEPT Provider Note   CSN: [id number] Arrival date & time: 02/03/19  1402    History   Chief Complaint Chief Complaint  Patient presents with  . Shortness of Breath    HPI Kurt Cross is a 68 y.o. male.     HPI Patient presents with confusion and hypoxia.  Report is had shortness of breath over the last few days.  History of CHF.  History of chronic infection of left heel.  Reportedly wound care nurse found patient confused with sats in the 80s.  Has had shortness of breath over the last few days and states he feels as if he is carrying more fluid.  No fevers.  States he weighs himself at home and his weight is up around 9 pounds over the last month.  History of CHF.  No fevers.  No sick contacts.  States he feels as if his left wound is getting better.  States he has more swelling on his legs.  Has an occasional nonproductive cough.  Previously had oxygen at home however does not have it currently. Past Medical History:  Diagnosis Date  . AAA (abdominal aortic aneurysm)/ 4.5 cm ascending per CT angio 08/01/13 08/12/2013  . Abscess 02/13/2018  . Acute on chronic renal failure (HCC) 01/27/2016   02/15/16 Na 130, K 4.6, Bun 47, creat 2.07    . Acute on chronic respiratory failure ([place]) 01/27/2016  . Acute on chronic respiratory failure with hypoxia ([place]) 01/27/2016   02/14/16 CXR no acute pulmonary abnormality, a nodule density noted at the right upper lobe, could represent a granuloma versus neoplastic lesion, may CT   . Acute renal failure (ARF) ([place]) 08/15/2013  . AKI (acute kidney injury) ([place]) 11/12/2013  . Atrial fibrillation ([place])   . Atrial flutter ([place]) 02/22/2016  . Benign hypertension   . Bradycardia   . BRONCHITIS, CHRONIC 09/01/2007   Qualifier: Diagnosis of  By: Doy Mince LPN, Megan    . Cellulitis   . Cellulitis of left lower extremity 02/14/2018  . CHF (congestive heart failure), NYHA class II ([place])   . Chronic anticoagulation  02/22/2016  . Chronic combined systolic and diastolic CHF (congestive heart failure) ([place]) 08/04/2013   02/14/16 CXR no acute pulmonary abnormality, a nodule density noted at the right upper lobe, could represent a granuloma versus neoplastic lesion, may CT   . Chronic respiratory failure ([place]) 02/10/2016  . Chronic ulcer of left heel ([place]) 02/17/2018  . CKD (chronic kidney disease), stage III ([place]) 02/17/2018  . Complete heart block (HCC)    s/p PPM Bi-Ven  . Constipation 02/07/2016  . Constipation, chronic 08/03/2013  . Debility 02/10/2016  . Decubitus ulcer of sacral region, stage 3 ([place]) 02/14/2018  . Diabetes mellitus, type 2 ([place])   . Elevated troponin 01/27/2016  . Fever 08/01/2013  . H/O medication noncompliance 02/22/2016  . Hernia of abdominal cavity   . Hyperkalemia 11/12/2013  . Hyperlipidemia   . Hypertension   . Hypokalemia   . Hypoxemia   . Hypoxia 01/26/2016  . 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 Kidney failure   . Leukocytosis 08/11/2013  . Neuropathy   . Obesity, Class III, BMI 40-49.9 (morbid obesity) ([place]) 09/01/2007   Qualifier: Diagnosis of  By: Doy Mince LPN, Megan    . Obstructive sleep apnea 09/01/2007   Qualifier: Diagnosis of  By: Doy Mince LPN, Megan    . OSA (obstructive sleep apnea)   .  Recurrent ventral incisional hernia 08/14/2013  . Right leg pain 08/11/2013  . S/P placement of cardiac pacemaker 02/22/2016  . Sepsis ([place]) 08/02/2013  . Short gut syndrome due to distal jejunal SB fistula 10/22/2013    Patient Active Problem List   Diagnosis Date Noted  . Diabetic ulcer of ankle associated with type 2 diabetes mellitus, limited to breakdown of skin ([place]) 12/03/2018  . CKD (chronic kidney disease), stage III ([place]) 02/17/2018  . S/P placement of cardiac pacemaker 02/22/2016  . Atrial flutter ([place]) 02/22/2016  . Chronic anticoagulation 02/22/2016  . H/O medication noncompliance 02/22/2016  . Debility  02/10/2016  . Constipation 02/07/2016  . Acute on chronic combined systolic and diastolic CHF, NYHA class 2 ([place]) 01/27/2016  . Acute on chronic renal failure ([place]) 01/27/2016  . Acute on chronic respiratory failure with hypoxia ([place]) 01/27/2016  . Hypoxia 01/26/2016  . Atrial fibrillation/flutter 10/06/2013  . 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 Recurrent ventral incisional hernia 08/14/2013  . AAA (abdominal aortic aneurysm)/ 4.5 cm ascending per CT angio 08/01/13 08/12/2013  . Right leg pain 08/11/2013  . Chronic combined systolic and diastolic CHF (congestive heart failure) ([place]) 08/04/2013  . Essential hypertension 06/28/2012  . Diabetes mellitus, insulin dependent (IDDM), uncontrolled ([place]) 09/01/2007  . Hyperlipidemia 09/01/2007  . Obesity, Class III, BMI 40-49.9 (morbid obesity) ([place]) 09/01/2007  . Obstructive sleep apnea 09/01/2007    Past Surgical History:  Procedure Laterality Date  . APPENDECTOMY    . biventricular pacemaker generator change  04/29/2014   MDT Viva CRT-P BiV pacemaker implanted by Dr Rolla Etienne in [place]  . biventricular pacemaker implantation  06/18/2007   MDT BIV pacemaker implanted by Dr Lovena Le  . BOWEL RESECTION  1980s?   ?colectomy/ostomy - Dr Elesa Hacker  . INCISION AND DRAINAGE ABSCESS N/A 08/14/2013   Procedure: INCISION AND DRAINAGE ABSCESS, mesh ;  Surgeon: [name], MD;  Location: [facility] ORS;  Service: General;  Laterality: N/A;  . [place]?   Dr. Lindon Romp - w mesh  . KNEE SURGERY Right   . LEFT HEART CATHETERIZATION WITH CORONARY ANGIOGRAM N/A 03/17/2014   Procedure: LEFT HEART CATHETERIZATION WITH CORONARY ANGIOGRAM;  Surgeon: [name], MD;  Location: [facility] CATH LAB;  Service: Cardiovascular;  Laterality: N/A;  . NASAL SEPTUM SURGERY    . OSTOMY TAKE DOWN  1980s?   Dr Elesa Hacker  . TONSILLECTOMY AND ADENOIDECTOMY          Home Medications    Prior to Admission medications   Medication Sig  Start Date End Date Taking? Authorizing Provider  Adhesive Tape (ADHESIVE PAPER) TAPE To use with pads/dressing. 12/08/18   [name], MD  Adhesive Tape TAPE To use with dressings/pads. 12/08/18   [name], MD  Blood Glucose Monitoring Suppl (ACCU-CHEK AVIVA) device Use as instructed 08/25/18 08/25/19  [name], MD  carvedilol (COREG) 25 MG tablet Take 25 mg by mouth 2 (two) times daily. 07/25/14   [provider]  cyclobenzaprine (FLEXERIL) 5 MG tablet Take 5 mg by mouth 3 (three) times daily.    [provider]  Disposable Gloves MISC To use with wound dressing/changing. 12/08/18   [name], MD  furosemide (LASIX) 80 MG tablet Take 80 mg by mouth daily.     [provider]  gabapentin (NEURONTIN) 300 MG capsule Take 300 mg by mouth every morning. 10/17/17   [provider]  Gauze Bandages MISC To use  daily. 12/08/18   [name], MD  Gauze Pads & Dressings (ABDOMINAL PAD) 8"X10" PADS 1 each by Does not apply route as needed. 12/08/18   [name], MD  Gauze Pads & Dressings (AMD FOAM DRESSING) 4"X4" PADS To use daily. 12/08/18   [name], MD  Gauze Pads & Dressings [facility] BANDAGE ROLL) MISC To use daily. 12/08/18   [name], MD  insulin NPH Human (HUMULIN N,NOVOLIN N) 100 UNIT/ML injection Inject 20 units in the morning and 20 units in the evening.    [provider]  insulin regular (NOVOLIN R,HUMULIN R) 100 units/mL injection Use with sliding scale.    [provider]  KLOR-CON M20 20 MEQ tablet Take 20 mg by mouth 2 (two) times daily. 01/06/18   [provider]  metolazone (ZAROXOLYN) 2.5 MG tablet Take 2.5 mg by mouth daily.    [provider]  oxyCODONE-acetaminophen (PERCOCET) 10-325 MG tablet Take 1 tablet by mouth every 6 (six) hours as needed.  08/24/18   [provider]  rosuvastatin (CRESTOR) 10 MG tablet Take 10 mg  by mouth daily.    [provider]  Skin Protectants, Misc. (EUCERIN) cream Apply topically as needed for wound care. 12/08/18   [name], MD  spironolactone (ALDACTONE) 25 MG tablet Take 1 tablet (25 mg total) by mouth 2 (two) times daily. 09/09/18   [name], MD  traZODone (DESYREL) 50 MG tablet TAKE 2 TABLETS BY MOUTH ONCE DAILY AT BEDTIME AS NEEDED 06/29/18   [provider]  warfarin (COUMADIN) 7.5 MG tablet Take 7.5 mg by mouth daily. 09/25/18   [provider]    Family History Family History  Problem Relation Age of Onset  . Diabetes Mother   . Alcoholism Father   . Heart Problems Maternal Grandmother     Social History Social History   Tobacco Use  . Smoking status: Former Smoker    Packs/day: 1.00    Years: 40.00    Pack years: 40.00    Types: Cigarettes    Last attempt to quit: 08/21/2012    Years since quitting: 6.4  . Smokeless tobacco: Never Used  Substance Use Topics  . Alcohol use: Yes    Alcohol/week: 0.0 standard drinks    Comment: occasional  . Drug use: No     Allergies   Patient has no known allergies.   Review of Systems Review of Systems  Constitutional: Negative for appetite change, chills and fever.  HENT: Negative for congestion.   Respiratory: Positive for cough and shortness of breath.   Cardiovascular: Positive for leg swelling. Negative for chest pain.  Gastrointestinal: Negative for abdominal pain.  Genitourinary: Negative for flank pain.  Musculoskeletal: Positive for gait problem.  Skin: Positive for wound.  Neurological: Negative for weakness and numbness.     Physical Exam Updated Vital Signs BP (!) 132/91 (BP Location: Right Arm)   Pulse 75   Temp 97.7 F (36.5 C) (Oral)   Resp 15   Ht 5\' 11"  (1.803 m)   Wt 131.1 kg   SpO2 98%   BMI 40.31 kg/m   Physical Exam Vitals signs and nursing note reviewed.  Constitutional:      Appearance: He is well-developed.  HENT:      Head: Atraumatic.  Neck:     Musculoskeletal: Neck supple.  Cardiovascular:     Rate and Rhythm: Regular rhythm.  Pulmonary:     Comments: Harsh breath sounds  bilateral bases with some rales. Chest:     Chest wall: No tenderness.  Abdominal:     Tenderness: There is no abdominal tenderness.  Musculoskeletal:     Right lower leg: Edema present.     Left lower leg: Edema present.     Comments: Pitting edema bilateral lower extremities.  Pressure wound to left medial heel.  Minimal erythema with no drainage.  Skin:    General: Skin is warm.     Capillary Refill: Capillary refill takes less than 2 seconds.  Neurological:     Comments: Patient is awake and appropriate now.      ED Treatments / Results  Labs (all labs ordered are listed, but only abnormal results are displayed) Labs Reviewed  BASIC METABOLIC PANEL  CBC    EKG EKG Interpretation  Date/Time:  Wednesday February 03 2019 14:34:46 EDT Ventricular Rate:  70 PR Interval:    QRS Duration: 158 QT Interval:  536 QTC Calculation: 579 R Axis:   -112 Text Interpretation:  Ventricular-paced rhythm No further analysis attempted due to paced rhythm Confirmed by Davonna Belling [phone]) on 02/03/2019 2:45:40 PM   Radiology No results found.  Procedures Procedures (including critical care time)  Medications Ordered in ED Medications  albuterol (PROVENTIL) (2.5 MG/3ML) 0.083% nebulizer solution 5 mg (has no administration in time range)     Initial Impression / Assessment and Plan / ED Course  I have reviewed the triage vital signs and the nursing notes.  Pertinent labs & imaging results that were available during my care of the patient were reviewed by me and considered in my medical decision making (see chart for details).        Patient with hypoxia at home.  I think likely related to CHF.  No fevers.  States his weight is up.  History of CHF and has moderate edema of both lower extremities.  Has rales in  the bases.  Will get work-up including x-[place] and blood work.  Left heel actually looks as if it may be healing well.  Reviewing notes patient appears to have changed PCPs to Dr. Daphene Jaeger from Dr. Alfonso Ramus.  Care will be turned over to oncoming provider.  Final Clinical Impressions(s) / ED Diagnoses   Final diagnoses:  Hypoxia    ED Discharge Orders    None       Davonna Belling, MD 02/03/19 1447

## 2019-02-03 NOTE — Progress Notes (Signed)
MD,  Please call [name] (spouse)@ [phone], with updates on patient's status. Thanks

## 2019-02-04 ENCOUNTER — Ambulatory Visit: Payer: Self-pay | Admitting: Pharmacist

## 2019-02-04 ENCOUNTER — Encounter (HOSPITAL_COMMUNITY): Payer: Self-pay | Admitting: Student

## 2019-02-04 ENCOUNTER — Inpatient Hospital Stay (HOSPITAL_COMMUNITY): Payer: Medicare HMO

## 2019-02-04 DIAGNOSIS — I340 Nonrheumatic mitral (valve) insufficiency: Secondary | ICD-10-CM

## 2019-02-04 DIAGNOSIS — E782 Mixed hyperlipidemia: Secondary | ICD-10-CM

## 2019-02-04 DIAGNOSIS — N183 Chronic kidney disease, stage 3 (moderate): Secondary | ICD-10-CM

## 2019-02-04 DIAGNOSIS — Z7901 Long term (current) use of anticoagulants: Secondary | ICD-10-CM

## 2019-02-04 DIAGNOSIS — I361 Nonrheumatic tricuspid (valve) insufficiency: Secondary | ICD-10-CM

## 2019-02-04 DIAGNOSIS — I10 Essential (primary) hypertension: Secondary | ICD-10-CM

## 2019-02-04 DIAGNOSIS — Z950 Presence of cardiac pacemaker: Secondary | ICD-10-CM

## 2019-02-04 DIAGNOSIS — I4821 Permanent atrial fibrillation: Secondary | ICD-10-CM

## 2019-02-04 DIAGNOSIS — I5023 Acute on chronic systolic (congestive) heart failure: Secondary | ICD-10-CM

## 2019-02-04 LAB — BASIC METABOLIC PANEL
Anion gap: 8 (ref 5–15)
BUN: 30 mg/dL — ABNORMAL HIGH (ref 8–23)
CO2: 29 mmol/L (ref 22–32)
Calcium: 8.2 mg/dL — ABNORMAL LOW (ref 8.9–10.3)
Chloride: 105 mmol/L (ref 98–111)
Creatinine, Ser: 2.41 mg/dL — ABNORMAL HIGH (ref 0.61–1.24)
GFR calc Af Amer: 31 mL/min — ABNORMAL LOW (ref >60)
GFR calc non Af Amer: 27 mL/min — ABNORMAL LOW (ref >60)
Glucose, Bld: 179 mg/dL — ABNORMAL HIGH (ref 70–99)
Potassium: 4.2 mmol/L (ref 3.5–5.1)
Sodium: 142 mmol/L (ref 135–145)

## 2019-02-04 LAB — LIPID PANEL
Cholesterol: 84 mg/dL (ref 0–200)
HDL: 26 mg/dL — ABNORMAL LOW (ref >40)
LDL Cholesterol: 49 mg/dL (ref 0–99)
Total CHOL/HDL Ratio: 3.2 RATIO
Triglycerides: 47 mg/dL (ref <150)
VLDL: 9 mg/dL (ref 0–40)

## 2019-02-04 LAB — ECHOCARDIOGRAM COMPLETE
Height: 71 in
Weight: 5008.85 oz

## 2019-02-04 LAB — TROPONIN I
Troponin I: <0.03 ng/mL (ref <0.03)
Troponin I: <0.03 ng/mL (ref <0.03)

## 2019-02-04 LAB — PROTIME-INR
INR: 2.8 — ABNORMAL HIGH (ref 0.8–1.2)
Prothrombin Time: 28.8 seconds — ABNORMAL HIGH (ref 11.4–15.2)

## 2019-02-04 LAB — HEMOGLOBIN A1C
Hgb A1c MFr Bld: 8.3 % — ABNORMAL HIGH (ref 4.8–5.6)
Mean Plasma Glucose: 191.51 mg/dL

## 2019-02-04 LAB — GLUCOSE, CAPILLARY
Glucose-Capillary: 162 mg/dL — ABNORMAL HIGH (ref 70–99)
Glucose-Capillary: 215 mg/dL — ABNORMAL HIGH (ref 70–99)
Glucose-Capillary: 184 mg/dL — ABNORMAL HIGH (ref 70–99)
Glucose-Capillary: 141 mg/dL — ABNORMAL HIGH (ref 70–99)

## 2019-02-04 LAB — MAGNESIUM: Magnesium: 2 mg/dL (ref 1.7–2.4)

## 2019-02-04 LAB — MRSA PCR SCREENING: MRSA by PCR: POSITIVE — AB

## 2019-02-04 MED ORDER — CHLORHEXIDINE GLUCONATE CLOTH 2 % EX PADS
6.0000 | MEDICATED_PAD | Freq: Every day | CUTANEOUS | Status: AC
  Administered 2019-02-05 – 2019-02-08 (×4): 6 via TOPICAL

## 2019-02-04 MED ORDER — PERFLUTREN LIPID MICROSPHERE
1.0000 mL | INTRAVENOUS | Status: AC | PRN
  Administered 2019-02-04: 11:00:00 4 mL via INTRAVENOUS
  Filled 2019-02-04: qty 10

## 2019-02-04 MED ORDER — INSULIN NPH (HUMAN) (ISOPHANE) 100 UNIT/ML ~~LOC~~ SUSP
15.0000 [IU] | Freq: Two times a day (BID) | SUBCUTANEOUS | Status: DC
  Administered 2019-02-04: 09:00:00 15 [IU] via SUBCUTANEOUS

## 2019-02-04 MED ORDER — PROMETHAZINE HCL 25 MG/ML IJ SOLN
12.5000 mg | Freq: Once | INTRAMUSCULAR | Status: AC
  Administered 2019-02-04: 12.5 mg via INTRAVENOUS
  Filled 2019-02-04: qty 1

## 2019-02-04 MED ORDER — MUPIROCIN 2 % EX OINT
TOPICAL_OINTMENT | CUTANEOUS | Status: AC
  Filled 2019-02-04: qty 22

## 2019-02-04 MED ORDER — INSULIN NPH (HUMAN) (ISOPHANE) 100 UNIT/ML ~~LOC~~ SUSP
15.0000 [IU] | Freq: Two times a day (BID) | SUBCUTANEOUS | Status: DC
  Administered 2019-02-04: 15 [IU] via SUBCUTANEOUS

## 2019-02-04 MED ORDER — LORAZEPAM 2 MG/ML IJ SOLN
1.0000 mg | Freq: Once | INTRAMUSCULAR | Status: AC
  Administered 2019-02-04: 1 mg via INTRAVENOUS
  Filled 2019-02-04: qty 1

## 2019-02-04 MED ORDER — WARFARIN SODIUM 5 MG PO TABS
5.0000 mg | ORAL_TABLET | Freq: Once | ORAL | Status: AC
  Administered 2019-02-04: 5 mg via ORAL
  Filled 2019-02-04: qty 1

## 2019-02-04 MED ORDER — LIVING WELL WITH DIABETES BOOK
Freq: Once | Status: AC
  Administered 2019-02-04: 13:00:00

## 2019-02-04 MED ORDER — INSULIN NPH (HUMAN) (ISOPHANE) 100 UNIT/ML ~~LOC~~ SUSP: 10.0000 [IU] | Freq: Two times a day (BID) | SUBCUTANEOUS | Status: DC

## 2019-02-04 MED ORDER — MUPIROCIN 2 % EX OINT
1.0000 "application " | TOPICAL_OINTMENT | Freq: Two times a day (BID) | CUTANEOUS | Status: AC
  Administered 2019-02-04 – 2019-02-08 (×9): 1 via NASAL
  Filled 2019-02-04 (×3): qty 22

## 2019-02-04 NOTE — TOC Initial Note (Signed)
Transition of Care [facility]) - Initial/Assessment Note    Patient Details  Name: Kurt Cross MRN: [id number] Date of Birth: 1950-10-08  Transition of Care [facility]) CM/SW Contact:    Lynnell Catalan, RN Phone Number: 02/04/2019, 10:00 AM  Clinical Narrative:                 Pt [facility] with [facility] from [facility]. [facility] liaison contacted to inform of admission. Will need MD orders for resumption of services at discharge. PT eval pending.  Expected Discharge Plan: [place] Barriers to Discharge: Continued Medical Work up    Expected Discharge Plan and Services Expected Discharge Plan: [place]   Discharge Planning Services: CM Consult   Living arrangements for the past 2 months: Single Family Home Expected Discharge Date: (unknown)                             Date HH Agency Contacted: 02/04/19 Time [place]: 0930 Representative spoke with at [place]: Daine Gip  Prior Living Arrangements/Services Living arrangements for the past 2 months: [place] Lives with:: Significant Other              Current home services: Home RN    Activities of Daily Living Home Assistive Devices/Equipment: CBG Meter, Cane (specify quad or straight), Environmental consultant (specify type), Wheelchair, Hospital bed(4 wheeled walker, single point cane, ramped entrance) ADL Screening (condition at time of admission) Patient's cognitive ability adequate to safely complete daily activities?: Yes Is the patient deaf or have difficulty hearing?: No Does the patient have difficulty seeing, even when wearing glasses/contacts?: No Does the patient have difficulty concentrating, remembering, or making decisions?: Yes Patient able to express need for assistance with ADLs?: Yes Does the patient have difficulty dressing or bathing?: Yes Independently performs ADLs?: No Communication: Independent Dressing (OT): Needs assistance Is this a change  from baseline?: Pre-admission baseline Grooming: Needs assistance Is this a change from baseline?: Pre-admission baseline Feeding: Needs assistance Is this a change from baseline?: Pre-admission baseline Bathing: Needs assistance Is this a change from baseline?: Pre-admission baseline Toileting: Needs assistance Is this a change from baseline?: Pre-admission baseline In/Out Bed: Needs assistance Is this a change from baseline?: Pre-admission baseline Walks in Home: Needs assistance Is this a change from baseline?: Pre-admission baseline Does the patient have difficulty walking or climbing stairs?: Yes Weakness of Legs: Both Weakness of Arms/Hands: Both  Permission Sought/Granted                  Emotional Assessment              Admission diagnosis:  Hypoxia [R09.02] Patient Active Problem List   Diagnosis Date Noted  . Acute exacerbation of CHF (congestive heart failure) ([place]) 02/03/2019  . Diabetic ulcer of ankle associated with type 2 diabetes mellitus, limited to breakdown of skin ([place]) 12/03/2018  . CKD (chronic kidney disease), stage III ([place]) 02/17/2018  . S/P placement of cardiac pacemaker 02/22/2016  . Atrial flutter ([place]) 02/22/2016  . Chronic anticoagulation 02/22/2016  . H/O medication noncompliance 02/22/2016  . Debility 02/10/2016  . Constipation 02/07/2016  . Acute on chronic combined systolic and diastolic CHF, NYHA class 2 ([place]) 01/27/2016  . Acute on chronic renal failure ([place]) 01/27/2016  . Acute on chronic respiratory failure with hypoxia ([place]) 01/27/2016  . Hypoxia 01/26/2016  . Atrial fibrillation/flutter 10/06/2013  . Jejunal SB fistula  into abdominal wound 08/23/2013  . Recurrent ventral incisional hernia 08/14/2013  . AAA (abdominal aortic aneurysm)/ 4.5 cm ascending per CT angio 08/01/13 08/12/2013  . Right leg pain 08/11/2013  . Chronic combined systolic and diastolic CHF (congestive heart failure) ([place]) 08/04/2013  . Essential  hypertension 06/28/2012  . Diabetes mellitus, insulin dependent (IDDM), uncontrolled ([place]) 09/01/2007  . Hyperlipidemia 09/01/2007  . Obesity, Class III, BMI 40-49.9 (morbid obesity) ([place]) 09/01/2007  . Obstructive sleep apnea 09/01/2007   PCP:  [name], MD Pharmacy:   [facility] [place] - [address] [place] ([place]) [place] [address] Phone: [phone] Fax: [phone]  [place], [place] [facility] [address] [place] [address] [place] [address] Phone: [phone] Fax: [phone]     Social Determinants of Health (SDOH) Interventions    Readmission Risk Interventions No flowsheet data found.

## 2019-02-04 NOTE — Evaluation (Signed)
Physical Therapy Evaluation Patient Details Name: Kurt Cross MRN: [id number] DOB: 25-Mar-1951 Today's Date: 02/04/2019   History of Present Illness  68 year old man was admitted to ED on 4/29 with SOB/CHF exacerbation.  PMH:  AAA, CKD, Heart block with pacemaker, DM, and chronic L heel  Clinical Impression   Pt presents with LE weakness, LE pain L>R, difficulty performing mobility tasks, dyspnea on exertion, and decreased activity tolerance. Pt to benefit from acute PT to address deficits. Pt is requiring min-max assist +2 for safety for mobility at this time, pt reports being more independent at baseline and states he feels weak. PT suspects pt to make physical improvements prior to d/c, and home with HHPT will be appropriate. Pt has all needed equipment, including a hospital bed, to d/c home. However, if pt continues to require heavy physical assist for mobility, may require ST-SNF placement to address deficits. PT to progress mobility as tolerated, and will continue to follow acutely.      Follow Up Recommendations Supervision/Assistance - 24 hour;Home health PT;SNF(SNF vs HHPT pending pt progress)    Equipment Recommendations  None recommended by PT    Recommendations for Other Services       Precautions / Restrictions Precautions Precautions: Fall;ICD/Pacemaker Restrictions Weight Bearing Restrictions: No      Mobility  Bed Mobility Overal bed mobility: Needs Assistance Bed Mobility: Sidelying to Sit;Rolling;Sit to Supine Rolling: Min guard;+2 for safety/equipment Sidelying to sit: Min assist;Mod assist;+2 for safety/equipment   Sit to supine: Max assist;+2 for safety/equipment   General bed mobility comments: Min guard for rolling, pt with use of bedrails for completing rolling to R. Mod assist for sidelying to sit on R side of bed for trunk elevation and LE managment. Upon sitting up, pt scooted anteriorly and pt unable to scoot self backwards onto bed. Pt returned to  supine with max assist +2 for LE lifting, trunk lowering. Pt then proceeded to exit bed toward the L side, and required min assist for coming to sitting for LE management and trunk elevation. Pt with self steaadying upon sitting EOB.   Transfers Overall transfer level: Needs assistance Equipment used: Sliding board Transfers: Lateral/Scoot Transfers          Lateral/Scoot Transfers: Min assist;With slide board;From elevated surface;+2 safety/equipment General transfer comment: Pt transferred to R, toward strong side. Min assist for placement and removal of tranfer board, scooting on transfer board, and completing scoot onto drop-arm recliner.   Ambulation/Gait Ambulation/Gait assistance: (NT)              Stairs            Wheelchair Mobility    Modified Rankin (Stroke Patients Only)       Balance Overall balance assessment: Needs assistance Sitting-balance support: No upper extremity supported;Feet supported Sitting balance-Leahy Scale: Good Sitting balance - Comments: able to sit EOB, scoot laterally with use of UEs   Standing balance support: During functional activity Standing balance-Leahy Scale: Zero Standing balance comment: unable to stand, uses transfer board for transfers to and from recliner                             Pertinent Vitals/Pain Pain Assessment: 0-10 Pain Score: 8  Pain Location: LLE Pain Descriptors / Indicators: Sore Pain Intervention(s): Limited activity within patient's tolerance;Monitored during session;Repositioned    Home Living Family/patient expects to be discharged to:: Private residence Living Arrangements: Spouse/significant other;Children Available Help  at Discharge: Family;Available 24 hours/day Type of Home: House Home Access: Level entry     Home Layout: One level Home Equipment: Walker - 4 wheels;Hospital bed;Bedside commode;Tub bench;Wheelchair - manual      Prior Function Level of Independence:  Needs assistance   Gait / Transfers Assistance Needed: Pt reports using sliding board to and from w/c at baseline, states he does not walk.   ADL's / Homemaking Assistance Needed: Pt's family   Comments: wife supervises.  Uses AE for dressing. Pt states he has been using sliding board since rehab admission, last year     Hand Dominance        Extremity/Trunk Assessment   Upper Extremity Assessment Upper Extremity Assessment: Defer to OT evaluation    Lower Extremity Assessment Lower Extremity Assessment: Generalized weakness;LLE deficits/detail LLE Deficits / Details: limited AROM in hip flexion, knee flexion/extension and PF/DF due to LLE pain  LLE Sensation: history of peripheral neuropathy    Cervical / Trunk Assessment Cervical / Trunk Assessment: Normal  Communication   Communication: No difficulties  Cognition Arousal/Alertness: Awake/alert Behavior During Therapy: WFL for tasks assessed/performed Overall Cognitive Status: Within Functional Limits for tasks assessed                                        General Comments General comments (skin integrity, edema, etc.): Pt reporting dizziness upon sitting EOB, BP and HR 166/89 and 71 bpm respectively. Pt on 2LO2 during session, sats >90% during transfer. Pt with edematous LEs, L>R.     Exercises     Assessment/Plan    PT Assessment Patient needs continued PT services  PT Problem List Decreased strength;Decreased mobility;Decreased safety awareness;Decreased activity tolerance;Decreased balance;Decreased knowledge of use of DME;Pain;Obesity       PT Treatment Interventions DME instruction;Functional mobility training;Balance training;Patient/family education;Gait training;Therapeutic activities;Therapeutic exercise;Neuromuscular re-education    PT Goals (Current goals can be found in the Care Plan section)  Acute Rehab PT Goals Patient Stated Goal: be able to stand and walk PT Goal Formulation:  With patient Time For Goal Achievement: 02/18/19 Potential to Achieve Goals: Fair    Frequency Min 3X/week   Barriers to discharge        Co-evaluation               AM-PAC PT "6 Clicks" Mobility  Outcome Measure Help needed turning from your back to your side while in a flat bed without using bedrails?: A Lot Help needed moving from lying on your back to sitting on the side of a flat bed without using bedrails?: A Lot Help needed moving to and from a bed to a chair (including a wheelchair)?: A Little Help needed standing up from a chair using your arms (e.g., wheelchair or bedside chair)?: Total Help needed to walk in hospital room?: Total Help needed climbing 3-5 steps with a railing? : Total 6 Click Score: 10    End of Session Equipment Utilized During Treatment: Gait belt;Oxygen(2LO2 via [place]) Activity Tolerance: Patient limited by fatigue;Patient limited by pain Patient left: in chair;with chair alarm set;with call bell/phone within reach Nurse Communication: Mobility status PT Visit Diagnosis: Other abnormalities of gait and mobility (R26.89);Muscle weakness (generalized) (M62.81);Pain Pain - Right/Left: Left Pain - part of body: Ankle and joints of foot    Time: 4268-3419 PT Time Calculation (min) (ACUTE ONLY): 24 min   Charges:   PT Evaluation $PT  Eval Low Complexity: 1 Low PT Treatments $Therapeutic Activity: 8-22 mins       Julien Girt, PT Acute Rehabilitation Services Pager [phone]  Office [place] 02/04/2019, 4:48 PM

## 2019-02-04 NOTE — Progress Notes (Signed)
Inpatient Diabetes Program Recommendations  AACE/ADA: New Consensus Statement on Inpatient Glycemic Control (2015)  Target Ranges:  Prepandial:   less than 140 mg/dL      Peak postprandial:   less than 180 mg/dL (1-2 hours)      Critically ill patients:  140 - 180 mg/dL   Lab Results  Component Value Date   GLUCAP 162 (H) 02/04/2019   HGBA1C 8.3 (H) 02/04/2019    Review of Glycemic Control  Results for Kurt Cross, Kurt Cross (MRN [id number]) as of 02/04/2019 10:20  Ref. Range 11/11/2018 22:03 11/11/2018 23:22 02/03/2019 20:38 02/03/2019 21:22 02/04/2019 08:04  Glucose-Capillary Latest Ref Range: 70 - 99 mg/dL 62 (L) 185 (H) 68 (L) 161 (H) 162 (H)   Diabetes history: DM2 Outpatient Diabetes medications: NPH 20 units bid + Novolin R correction Current orders for Inpatient glycemic control: NPH 15 units bid + Novolog sensitive correction tid + hs 0-5 units  Inpatient Diabetes Program Recommendations:   -Decrease NPH evening dose to 10 units  Thank you, Bethena Roys E. Lowana Hable, RN, MSN, CDE  Diabetes Coordinator Inpatient Glycemic Control Team Team Pager [phone] (8am-5pm) 02/04/2019 10:59 AM

## 2019-02-04 NOTE — Consult Note (Signed)
Cardiology Consult    Patient ID: Kurt Cross MRN: [id number], DOB/AGE: Jan 16, 1951   Admit date: 02/03/2019 Date of Consult: 02/04/2019  Primary Physician: [name], MD Primary Cardiologist: Ena Dawley, MD  Primary Electrophysiologist: Cristopher Peru, MD Requesting Provider: Irine Seal, MD  Patient Profile    Kurt Cross is a 68 y.o. male with a history of non-obstructive CAD on cardiac catheterization in 2015, permanent atrial fibrillation on Coumadin, complete heart block s/p PPM (MDT) initially in 2008 by Dr. Lovena Le with MDT CRT-P generator change in 2015 in [place], chronic systolic CHF with EF of 65-46% on Echo in 2017, chronic shortness of breath, obstructive sleep apnea, hypertension, hyperlipidemia, type 2 diabetes mellitus, CKD stage III, chronic debility, morbid obesity, and medication non-compliance who is being seen today for the evaluation of CHF at the request of Dr. Grandville Silos.  History of Present Illness    [name] is a 68 year old male with the above history who is followed by Dr. Meda Coffee and Dr. Lovena Le. Patient last saw Dr. Rayann Heman in 09/2018 to re-establish care in our EP office. Patient initially at [facility] PPM placed by Dr. Lovena Le in 2008 for intermittent complete heart block and LVEF of 35%. He had a MDT VIVA CRT-P generator change by Dr. Rolla Etienne in [place] in 2015 with little follow-up since then. Patient is device dependent. Per Dr. Jackalyn Lombard note, patient is chronically inactive and ill and has chronic issues with shortness of breath and edema.  Patient presented to the ED via EMS yesterday for evaluation of confusion and hypoxia. Patient has a home wound care RN for a chronic wound on his left lower extremity. Wound care RN reportedly found patient to be a little confused and hypoxic with O2 sats in the 80's yesterday; therefore, EMS was called. Upon EMS arrival, patient was placed on 2L via nasal cannula and O2 sats improved to 96%.  Patient reports  he has had some worsening shortness of breath at rest and a non-productive cough for the past week. He also reports orthopnea and states he has been having difficulty sleeping but denies any PND. Patient does have sleep apnea but does not have a CPAP/BiPAP machine. Patient does have a history of chronic shortness of breath and was reportedly on home O2 in the past but no longer has that. Patient has also noticed worsening lower extremity edema from baseline. He estimates that he has gained about 8 lbs over the last 2 weeks. He states he watches his sodium intake and avoids eating out/processed foods. Patient has a history of medication non-compliance and patient states he ran out of his Lasix 1 month ago. Patient also on Metolazone 2.5mg  daily, Coreg 25mg  twice daily, Spironolactone 25mg  twice daily for CHF. Patient states he has been taken all of his other medications. However, per H&P patient has been our of his Crestor and Lasix for 1 week. He denies any chest pain, palpitations, lightheadedness, dizziness, or syncope. He denies any fevers, chills, body aches, or known exposure to the coronavirus.   In the ED, vitals stable. EKG showed paced rhythm. Initial troponin negative. Chest x-Scrivener showed chronic cardiomegaly with new slight pulmonary vascular prominence with probable posterior pleural effusions. BNP mildly elevated at 194.2. WBC 8.5, Hgb 13.6, Plts 210. Na 140, K 4.3, Glucose 263, SCr 2.33 (baseline 1.6 to 2.2). COVID-19 testing negative. Patient was started on IV Lasix and admitted for further evaluation and management.  At the time of this evaluation, patient reports his  breathing has improved since receiving the Lasix. He was able to sleep well last and was able to lay mostly flat.   Patient is a former tobacco user but states he quit a couple of years ago. He denies any alcohol or illicit drug use. Patient's maternal grandmother had a "hole in her heart" but he denies any other known family  history of heart disease.   Past Medical History   Past Medical History:  Diagnosis Date  . AAA (abdominal aortic aneurysm)/ 4.5 cm ascending per CT angio 08/01/13 08/12/2013  . Abscess 02/13/2018  . Acute on chronic renal failure (HCC) 01/27/2016   02/15/16 Na 130, K 4.6, Bun 47, creat 2.07    . Acute on chronic respiratory failure ([place]) 01/27/2016  . Acute on chronic respiratory failure with hypoxia ([place]) 01/27/2016   02/14/16 CXR no acute pulmonary abnormality, a nodule density noted at the right upper lobe, could represent a granuloma versus neoplastic lesion, may CT   . Acute renal failure (ARF) ([place]) 08/15/2013  . AKI (acute kidney injury) ([place]) 11/12/2013  . Atrial fibrillation ([place])   . Atrial flutter ([place]) 02/22/2016  . Benign hypertension   . Bradycardia   . BRONCHITIS, CHRONIC 09/01/2007   Qualifier: Diagnosis of  By: Doy Mince LPN, Megan    . Cellulitis of left lower extremity 02/14/2018  . CHF (congestive heart failure), NYHA class II ([place])   . Chronic anticoagulation 02/22/2016  . Chronic combined systolic and diastolic CHF (congestive heart failure) ([place]) 08/04/2013   02/14/16 CXR no acute pulmonary abnormality, a nodule density noted at the right upper lobe, could represent a granuloma versus neoplastic lesion, may CT   . Chronic respiratory failure ([place]) 02/10/2016  . Chronic ulcer of left heel ([place]) 02/17/2018  . CKD (chronic kidney disease), stage III ([place]) 02/17/2018  . Complete heart block (HCC)    s/p PPM Bi-Ven  . Constipation, chronic 08/03/2013  . Debility 02/10/2016  . Decubitus ulcer of sacral region, stage 3 ([place]) 02/14/2018  . Diabetes mellitus, type 2 ([place])   . H/O medication noncompliance 02/22/2016  . Hernia of abdominal cavity   . Hyperkalemia 11/12/2013  . Hyperlipidemia   . Hypertension   . Hypokalemia   . Hypoxemia   . Hypoxia 01/26/2016  . 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 Kidney failure   .  Neuropathy   . Obesity, Class III, BMI 40-49.9 (morbid obesity) ([place]) 09/01/2007   Qualifier: Diagnosis of  By: Doy Mince LPN, Megan    . Obstructive sleep apnea 09/01/2007   Qualifier: Diagnosis of  By: Doy Mince LPN, Megan    . OSA (obstructive sleep apnea)   . Recurrent ventral incisional hernia 08/14/2013  . Right leg pain 08/11/2013  . S/P placement of cardiac pacemaker 02/22/2016  . Sepsis ([place]) 08/02/2013  . Short gut syndrome due to distal jejunal SB fistula 10/22/2013    Past Surgical History:  Procedure Laterality Date  . APPENDECTOMY    . biventricular pacemaker generator change  04/29/2014   MDT Viva CRT-P BiV pacemaker implanted by Dr Rolla Etienne in [place]  . biventricular pacemaker implantation  06/18/2007   MDT BIV pacemaker implanted by Dr Lovena Le  . BOWEL RESECTION  1980s?   ?colectomy/ostomy - Dr Elesa Hacker  . INCISION AND DRAINAGE ABSCESS N/A 08/14/2013   Procedure: INCISION AND DRAINAGE ABSCESS, mesh ;  Surgeon: [name], MD;  Location: [facility];  Service: General;  Laterality: N/A;  . Fatima Blank  HERNIA REPAIR  1980s?   Dr. Lindon Romp - w mesh  . KNEE SURGERY Right   . LEFT HEART CATHETERIZATION WITH CORONARY ANGIOGRAM N/A 03/17/2014   Procedure: LEFT HEART CATHETERIZATION WITH CORONARY ANGIOGRAM;  Surgeon: [name], MD;  Location: [facility];  Service: Cardiovascular;  Laterality: N/A;  . NASAL SEPTUM SURGERY    . OSTOMY TAKE DOWN  1980s?   Dr Elesa Hacker  . TONSILLECTOMY AND ADENOIDECTOMY       Allergies  No Known Allergies  Inpatient Medications    . carvedilol  25 mg Oral BID  . cyclobenzaprine  5 mg Oral TID  . furosemide  80 mg Intravenous Q12H  . gabapentin  300 mg Oral TID  . insulin aspart  0-5 Units Subcutaneous QHS  . insulin aspart  0-9 Units Subcutaneous TID WC  . insulin NPH Human  20 Units Subcutaneous BID AC & HS  . mouth rinse  15 mL Mouth Rinse BID  . metolazone  2.5 mg Oral Daily  . potassium chloride SA  20 mEq Oral BID  .  rosuvastatin  10 mg Oral Daily  . sodium chloride flush  3 mL Intravenous Q12H  . spironolactone  25 mg Oral BID  . Warfarin - Pharmacist Dosing Inpatient   Does not apply q1800    Family History    Family History  Problem Relation Age of Onset  . Diabetes Mother   . Alcoholism Father   . Heart Problems Maternal Grandmother    He indicated that his mother is deceased. He indicated that his father is deceased. He indicated that his maternal grandmother is deceased. He indicated that his maternal grandfather is deceased.   Social History    Social History   Socioeconomic History  . Marital status: Married    Spouse name: Not on file  . Number of children: Not on file  . Years of education: Not on file  . Highest education level: Not on file  Occupational History  . Not on file  Social Needs  . Financial resource strain: Not on file  . Food insecurity:    Worry: Not on file    Inability: Not on file  . Transportation needs:    Medical: Not on file    Non-medical: Not on file  Tobacco Use  . Smoking status: Former Smoker    Packs/day: 1.00    Years: 40.00    Pack years: 40.00    Types: Cigarettes    Last attempt to quit: 08/21/2012    Years since quitting: 6.4  . Smokeless tobacco: Never Used  Substance and Sexual Activity  . Alcohol use: Yes    Alcohol/week: 0.0 standard drinks    Comment: occasional  . Drug use: No  . Sexual activity: Not on file  Lifestyle  . Physical activity:    Days per week: Not on file    Minutes per session: Not on file  . Stress: Not on file  Relationships  . Social connections:    Talks on phone: Not on file    Gets together: Not on file    Attends religious service: Not on file    Active member of club or organization: Not on file    Attends meetings of clubs or organizations: Not on file    Relationship status: Not on file  . Intimate partner violence:    Fear of current or ex partner: Not on file    Emotionally abused: Not  on file  Physically abused: Not on file    Forced sexual activity: Not on file  Other Topics Concern  . Not on file  Social History Narrative   Lives at [facility] since 02/06/16   Married -[name]   Former smoker -stopped 2013   No Advance Directives     Review of Systems    Review of Systems  Constitutional: Negative for chills and fever.  HENT: Negative for congestion and sore throat.   Respiratory: Positive for cough and shortness of breath. Negative for hemoptysis and sputum production.   Cardiovascular: Positive for orthopnea and leg swelling. Negative for chest pain, palpitations and PND.  Gastrointestinal: Negative for blood in stool.  Genitourinary: Negative for hematuria.  Musculoskeletal: Negative for myalgias.  Neurological: Negative for dizziness and loss of consciousness.  Endo/Heme/Allergies: Does not bruise/bleed easily.  Psychiatric/Behavioral: Negative for substance abuse.    Physical Exam    Blood pressure (!) 142/85, pulse 70, temperature 97.7 F (36.5 C), temperature source Oral, resp. rate 17, height 5\' 11"  (1.803 m), weight (!) 142 kg, SpO2 95 %.   Given the current COVID-19 pandemic, I called into the patient's room to obtain a history. I did not physically exam the patient. However, patient did not sounds like he was in any acute distress. He was pleasant and cooperative and was able to talk in complete sentences without any obvious labored breathing.  MD to follow with exam.  Labs    Troponin (Point of Care Test) No results for input(s): TROPIPOC in the last 72 hours. Recent Labs    02/03/19 1435 02/03/19 2040 02/04/19 0220  TROPONINI <0.03 <0.03 <0.03   Lab Results  Component Value Date   WBC 8.5 02/03/2019   HGB 13.6 02/03/2019   HCT 43.8 02/03/2019   MCV 102.1 (H) 02/03/2019   PLT 210 02/03/2019    Recent Labs  Lab 02/03/19 1435 02/04/19 0220  NA 140 142  K 4.3 4.2  CL 105 105  CO2 28 29  BUN 31* 30*  CREATININE 2.33* 2.41*   CALCIUM 8.3* 8.2*  PROT 6.4*  --   BILITOT 0.8  --   ALKPHOS 33*  --   ALT 10  --   AST 12*  --   GLUCOSE 263* 179*   Lab Results  Component Value Date   CHOL 84 02/04/2019   HDL 26 (L) 02/04/2019   LDLCALC 49 02/04/2019   TRIG 47 02/04/2019   No results found for: [facility]   Radiology Studies    Dg Chest 2 View  Result Date: 02/03/2019 CLINICAL DATA:  Shortness of breath.  Hypoxia. EXAM: CHEST - 2 VIEW COMPARISON:  Chest x-Contee dated 02/13/2018 and 01/26/2016 FINDINGS: Chronic cardiomegaly pacemaker in place. Slight new pulmonary vascular prominence. On the lateral view there is suggestion of posteriorly layered pleural effusions. No acute bone abnormality. IMPRESSION: 1. Chronic cardiomegaly. 2. New slight pulmonary vascular prominence with probable posterior pleural effusions. Electronically Signed   By: Lorriane Shire M.D.   On: 02/03/2019 15:36   Dg Foot Complete Left  Result Date: 02/03/2019 CLINICAL DATA:  Chronic heel wound. EXAM: LEFT FOOT - COMPLETE 3+ VIEW COMPARISON:  02/13/2018 FINDINGS: There is diffuse osteopenia. Slight chronic arthritic changes of the first MTP joint. Chronic arthritic changes of the ankle joint. Soft tissue swelling of the foot primarily on the dorsum of the forefoot. Slight dorsal spurring in the midfoot. No evidence of osteomyelitis. Chronic calcifications in the distal Achilles tendon. IMPRESSION: 1. No acute bone abnormalities. 2.  No discrete osteomyelitis. 3. New soft tissue swelling of the foot. Electronically Signed   By: Lorriane Shire M.D.   On: 02/03/2019 15:38    EKG     EKG: EKG was personally reviewed and demonstrates: Unable to review remotely. MD to review. Telemetry: Telemetry was personally reviewed and demonstrates: Unable to review remotely. MD to review.   Cardiac Imaging    Echocardiogram 01/28/2016: Study Conclusions: - Left ventricle: The cavity size was normal. There was mild   concentric hypertrophy. Systolic function was  moderately to   severely reduced. The estimated ejection fraction was in the   range of 30% to 35%. The study is not technically sufficient to   allow evaluation of LV diastolic function. - Aortic valve: There was mild regurgitation. - Mitral valve: There was mild to moderate regurgitation directed   centrally. - Left atrium: The atrium was severely dilated. - Right atrium: The atrium was moderately dilated. - Tricuspid valve: There was moderate regurgitation. - Pulmonary arteries: Systolic pressure was moderately increased.   PA peak pressure: 55 mm Hg (S).  Assessment & Plan    Acute on Chronic Systolic CHF with Hypoxia - Patient presented with shortness of breath and cough x1 as well as worsening lower extremity and a 8-10 lb weight gain. O2 sats were found to be in the 80's. COVID-19 testing negative. Patient has a history of medication non-compliance and has reportedly missed his Lasix for at least the past week.  - Chest x-Messer showed Chest x-Nemes showed chronic cardiomegaly with new slight pulmonary vascular prominence with probable posterior pleural effusions.  - BNP mildly elevated at 194.2. - Troponin negative x3. - Echo pending. - Patient was started on IV Lasix in the ED. Documented urine output 900 mL since admission. Weight 313 lbs on admission. Last documented weight I see is 283 lb in 02/2018. - Continue IV Lasix 80mg  twice daily at this time. Will need to monitor renal function closely. - Continue home Metolazone 2.5mg  daily. - Continue home Coreg 25mg  twice daily ad Spironolactone 25mg  twice daily.  - Patient not on any ACEi/ARB/ARNi at home. - Continue to monitor daily weights, strict I/O's, and renal function.  Permanent Atrial Fibrillation/ History of Complete Heart Block s/p PPM - Continue Coreg as above. - Continue chronic anticoagulation per pharmacy.  Hypertension - Most recent BP 142/85. - Continue current medications.  Hyperlipidemia - Lipid panel this  admission: Cholesterol 84, Triglycerides 47, HDL 26, LDL 49. - Continue home statin.  Type 2 Diabetes Mellitus - Hemoglobin A1c pending. - Management per primary team.  Ascending Aortic Aneurysm - Chest CTA from 07/2013 showed 4.5cm ascending aortic aneurysm without complicating features. - Will defer any repeat imaging to MD.  Otherwise, per primary team.   Signed, Darreld Mclean, PA-C 02/04/2019, 7:41 AM Pager: [phone] For questions or updates, please contact   Please consult [email or web address] for contact info under Cardiology/STEMI.

## 2019-02-04 NOTE — Progress Notes (Signed)
PROGRESS NOTE    Kurt Cross  KGY:[id number] DOB: 1951-01-03 DOA: 02/03/2019 PCP: [name], MD   Brief Narrative:  Kurt Cross is a 68 y.o. male with medical history significant of chronic systolic heart failure, A. fib/a flutter, AAA, chronic kidney disease stage III, complete heart block status post PPM, chronic constipation, type 2 diabetes, chronic heel wound who presents to the ED with a one-week history of worsening shortness of breath and worsening lower extremity edema.  Patient states lower extremity edema has been ongoing for greater than 1 week.  Patient also endorses a 8 to 10 pound weight gain over the past 2 weeks.  Patient denies any fevers, no chest pain, no nausea, no cough, no melena, no hematemesis, no hematochezia.  Patient does endorse a bout of emesis 1 week prior to admission.  Patient denies any diarrhea, no constipation.  Patient does endorse some soft stools.  Patient denies any abdominal pain.  No syncopal episodes, no dietary indiscretions.  Patient does endorse some dysuria.  Patient is noted in his chart has a history of medical noncompliance patient states has missed 1 weeks worth of his Crestor and furosemide stating he ran out of his medications.  Patient also endorses decreased oral intake and some insomnia.  Patient stated that his wife made him come to the ED.  ED Course: Patient seen in the ED, comprehensive metabolic profile with a glucose of 263, BUN of 31, creatinine of 2.33, albumin of 2.7 otherwise was within normal limits.  BNP of 194.2.  Troponin was negative.  CBC unremarkable.  INR of 2.7.  COVID-19 was negative.  Chest x-Poss done with chronic cardiomegaly, new slight pulmonary vascular prominence with probable posterior pleural effusions.  Plain films of the left foot negative for any acute abnormalities.  EKG done with a paced rhythm.  Patient given a dose of Lasix 80 mg IV x1.  Hospitalist were called to admit the patient for further  evaluation and management.    Assessment & Plan:   Principal Problem:   Acute exacerbation of CHF (congestive heart failure) (HCC) Active Problems:   Diabetes mellitus, insulin dependent (IDDM), uncontrolled ([place])   Hyperlipidemia   Obesity, Class III, BMI 40-49.9 (morbid obesity) ([place])   Obstructive sleep apnea   Essential hypertension   Chronic combined systolic and diastolic CHF (congestive heart failure) (HCC)   AAA (abdominal aortic aneurysm)/ 4.5 cm ascending per CT angio 08/01/13   Atrial fibrillation/flutter   Debility   S/P placement of cardiac pacemaker   Chronic anticoagulation   CKD (chronic kidney disease), stage III (HCC)   Diabetic ulcer of ankle associated with type 2 diabetes mellitus, limited to breakdown of skin ([place])  1. acute on chronic systolic heart failure exacerbation Patient presented with shortness of breath x1 week, worsening lower extremity edema greater than a week, 8 to 10 pound weight gain over the past 2 weeks.  Patient afebrile.  No productive cough.  Questionable etiology.  May be secondary to medical noncompliance as patient does state he ran out of his furosemide and Crestor for greater than a week.  Per pharmacy tech patient noted to have refills of his furosemide with his medications.    Patient with a urine output of 900 cc since admission over the past 12 hours.  Cardiac enzymes negative x3.  TSH of 1.287 on 02/03/2019.    COVID-19 tested in the ED was negative. EKG is ventricularly paced.    2D echo pending.  Continue  Lasix 80 mg IV every 12 hours, Coreg, Zaroxolyn, spironolactone.  Monitor renal function closely.  Cardiology consulted and are following.  2.  Diabetes mellitus type 2 Hemoglobin A1c of 8.3.  Patient noted to have a blood glucose level of 68 at 2038 yesterday evening.  CBG of 162 this morning.  Decrease NPH to 15 units twice daily.  Continue sliding scale insulin.  Continue Neurontin.  Follow.   3.  Chronic A. fib/a  flutter/status post PPM. Continue Coreg for rate control.  INR therapeutic at 2.8.  Coumadin per pharmacy.    Potassium of 4.2.  Magnesium at 2.0.   4.  Hyperlipidemia Fasting lipid panel with LDL of 49.  Continue statin.   5.  Chronic left heel wound Seems to be healing.  No drainage noted.  Wound care.  6.  Chronic kidney disease stage III Seems to be at baseline.  Monitor closely with diuresis.  Follow.  7.  Obesity  8.  Probable OSA Will need formal outpatient sleep study.    DVT prophylaxis: Coumadin Code Status: DNR Family Communication: Updated patient.  No family at bedside. Disposition Plan: Likely back home with home health once acute CHF exacerbation is resolved and patient is euvolemic and when okay with cardiology.   Consultants:   Cardiology: Dr. Harrell Gave 02/04/2019  Procedures:   2D echo pending 02/04/2019  Antimicrobials:   None   Subjective: Patient sleeping arousable.  States some improvement with shortness of breath.  Denies any chest pain.  Objective: Vitals:   02/03/19 1907 02/03/19 2036 02/04/19 0425 02/04/19 0900  BP:  137/90 (!) 142/85   Pulse:  69 70 71  Resp:  20 17 16   Temp:  97.9 F (36.6 C) 97.7 F (36.5 C)   TempSrc:  Axillary Oral   SpO2:  98% 95% 96%  Weight: (!) 142 kg     Height: 5\' 11"  (1.803 m)       Intake/Output Summary (Last 24 hours) at 02/04/2019 1122 Last data filed at 02/04/2019 0526 Gross per 24 hour  Intake 360 ml  Output 900 ml  Net -540 ml   Filed Weights   02/03/19 1431 02/03/19 1907  Weight: 131.1 kg (!) 142 kg    Examination:  General exam: Appears calm and comfortable  Respiratory system: Bibasilar crackles.  Normal respiratory effort.  No wheezing.  No rhonchi.  Respiratory effort normal. Cardiovascular system: S1 & S2 heard, RRR. No JVD, murmurs, rubs, gallops or clicks.  2+ bilateral lower extremity edema left greater than right.  2-3+ bilateral pedal edema.  Gastrointestinal system:  Abdomen is nondistended, soft and nontender. No organomegaly or masses felt. Normal bowel sounds heard. Central nervous system: Alert and oriented. No focal neurological deficits. Extremities: Symmetric 5 x 5 power. Skin: No rashes, lesions or ulcers Psychiatry: Judgement and insight appear normal. Mood & affect appropriate.     Data Reviewed: I have personally reviewed following labs and imaging studies  CBC: Recent Labs  Lab 02/03/19 1435  WBC 8.5  HGB 13.6  HCT 43.8  MCV 102.1*  PLT 885   Basic Metabolic Panel: Recent Labs  Lab 02/03/19 1435 02/03/19 1530 02/04/19 0220  NA 140  --  142  K 4.3  --  4.2  CL 105  --  105  CO2 28  --  29  GLUCOSE 263*  --  179*  BUN 31*  --  30*  CREATININE 2.33*  --  2.41*  CALCIUM 8.3*  --  8.2*  MG  --  1.7 2.0   GFR: Estimated Creatinine Clearance: 44.1 mL/min (A) (by C-G formula based on SCr of 2.41 mg/dL (H)). Liver Function Tests: Recent Labs  Lab 02/03/19 1435  AST 12*  ALT 10  ALKPHOS 33*  BILITOT 0.8  PROT 6.4*  ALBUMIN 2.7*   No results for input(s): LIPASE, AMYLASE in the last 168 hours. No results for input(s): AMMONIA in the last 168 hours. Coagulation Profile: Recent Labs  Lab 02/03/19 1435 02/04/19 0220  INR 2.7* 2.8*   Cardiac Enzymes: Recent Labs  Lab 02/03/19 1435 02/03/19 2040 02/04/19 0220 02/04/19 0956  TROPONINI <0.03 <0.03 <0.03 <0.03   BNP (last 3 results) No results for input(s): PROBNP in the last 8760 hours. HbA1C: Recent Labs    02/04/19 0220  HGBA1C 8.3*   CBG: Recent Labs  Lab 02/03/19 2038 02/03/19 2122 02/04/19 0804  GLUCAP 68* 161* 162*   Lipid Profile: Recent Labs    02/04/19 0220  CHOL 84  HDL 26*  LDLCALC 49  TRIG 47  CHOLHDL 3.2   Thyroid Function Tests: Recent Labs    02/03/19 2040  TSH 1.287   Anemia Panel: No results for input(s): VITAMINB12, FOLATE, FERRITIN, TIBC, IRON, RETICCTPCT in the last 72 hours. Sepsis Labs: No results for input(s):  PROCALCITON, LATICACIDVEN in the last 168 hours.  Recent Results (from the past 240 hour(s))  SARS Coronavirus 2 [facility] order, Performed in [place] hospital lab)     Status: None   Collection Time: 02/03/19  3:27 PM  Result Value Ref Range Status   SARS Coronavirus 2 NEGATIVE NEGATIVE Final    Comment: (NOTE) If result is NEGATIVE SARS-CoV-2 target nucleic acids are NOT DETECTED. The SARS-CoV-2 RNA is generally detectable in upper and lower  respiratory specimens during the acute phase of infection. The lowest  concentration of SARS-CoV-2 viral copies this assay can detect is 250  copies / mL. A negative result does not preclude SARS-CoV-2 infection  and should not be used as the sole basis for treatment or other  patient management decisions.  A negative result may occur with  improper specimen collection / handling, submission of specimen other  than nasopharyngeal swab, presence of viral mutation(s) within the  areas targeted by this assay, and inadequate number of viral copies  (<250 copies / mL). A negative result must be combined with clinical  observations, patient history, and epidemiological information. If result is POSITIVE SARS-CoV-2 target nucleic acids are DETECTED. The SARS-CoV-2 RNA is generally detectable in upper and lower  respiratory specimens dur ing the acute phase of infection.  Positive  results are indicative of active infection with SARS-CoV-2.  Clinical  correlation with patient history and other diagnostic information is  necessary to determine patient infection status.  Positive results do  not rule out bacterial infection or co-infection with other viruses. If result is PRESUMPTIVE POSTIVE SARS-CoV-2 nucleic acids MAY BE PRESENT.   A presumptive positive result was obtained on the submitted specimen  and confirmed on repeat testing.  While 2019 novel coronavirus  (SARS-CoV-2) nucleic acids may be present in the submitted sample  additional  confirmatory testing may be necessary for epidemiological  and / or clinical management purposes  to differentiate between  SARS-CoV-2 and other Sarbecovirus currently known to infect humans.  If clinically indicated additional testing with an alternate test  methodology [phone]) is advised. The SARS-CoV-2 RNA is generally  detectable in upper and lower respiratory sp ecimens during the acute  phase of infection. The expected result is Negative. Fact Sheet for Patients:  StrictlyIdeas.no Fact Sheet for Healthcare Providers: BankingDealers.co.za This test is not yet approved or cleared by the [place] FDA and has been authorized for detection and/or diagnosis of SARS-CoV-2 by FDA under an Emergency Use Authorization (EUA).  This EUA will remain in effect (meaning this test can be used) for the duration of the COVID-19 declaration under Section 564(b)(1) of the Act, 21 U.S.C. section 360bbb-3(b)(1), unless the authorization is terminated or revoked sooner. Performed at [facility], [place] [address], [place], [place] [address]          Radiology Studies: Dg Chest 2 View  Result Date: 02/03/2019 CLINICAL DATA:  Shortness of breath.  Hypoxia. EXAM: CHEST - 2 VIEW COMPARISON:  Chest x-[place] dated 02/13/2018 and 01/26/2016 FINDINGS: Chronic cardiomegaly pacemaker in place. Slight new pulmonary vascular prominence. On the lateral view there is suggestion of posteriorly layered pleural effusions. No acute bone abnormality. IMPRESSION: 1. Chronic cardiomegaly. 2. New slight pulmonary vascular prominence with probable posterior pleural effusions. Electronically Signed   By: Lorriane Shire M.D.   On: 02/03/2019 15:36   Dg Foot Complete Left  Result Date: 02/03/2019 CLINICAL DATA:  Chronic heel wound. EXAM: LEFT FOOT - COMPLETE 3+ VIEW COMPARISON:  02/13/2018 FINDINGS: There is diffuse osteopenia. Slight chronic arthritic  changes of the first MTP joint. Chronic arthritic changes of the ankle joint. Soft tissue swelling of the foot primarily on the dorsum of the forefoot. Slight dorsal spurring in the midfoot. No evidence of osteomyelitis. Chronic calcifications in the distal Achilles tendon. IMPRESSION: 1. No acute bone abnormalities. 2. No discrete osteomyelitis. 3. New soft tissue swelling of the foot. Electronically Signed   By: Lorriane Shire M.D.   On: 02/03/2019 15:38        Scheduled Meds:  carvedilol  25 mg Oral BID   cyclobenzaprine  5 mg Oral TID   furosemide  80 mg Intravenous Q12H   gabapentin  300 mg Oral TID   insulin aspart  0-5 Units Subcutaneous QHS   insulin aspart  0-9 Units Subcutaneous TID WC   insulin NPH Human  10 Units Subcutaneous BID AC & HS   living well with diabetes book   Does not apply Once   mouth rinse  15 mL Mouth Rinse BID   metolazone  2.5 mg Oral Daily   potassium chloride SA  20 mEq Oral BID   rosuvastatin  10 mg Oral Daily   sodium chloride flush  3 mL Intravenous Q12H   spironolactone  25 mg Oral BID   warfarin  5 mg Oral ONCE-1800   Warfarin - Pharmacist Dosing Inpatient   Does not apply q1800   Continuous Infusions:  sodium chloride       LOS: 1 day    Time spent: 35 minutes    Irine Seal, MD [facility]  If 7PM-7AM, please contact night-coverage [email or web address] 02/04/2019, 11:22 AM

## 2019-02-04 NOTE — Progress Notes (Signed)
[place] for warfarin Indication: Afib/flutter  No Known Allergies  Patient Measurements: Height: 5\' 11"  (180.3 cm) Weight: (!) 313 lb 0.9 oz (142 kg) IBW/kg (Calculated) : 75.3  Vital Signs: Temp: 97.7 F (36.5 C) (04/30 0425) Temp Source: Oral (04/30 0425) BP: 142/85 (04/30 0425) Pulse Rate: 70 (04/30 0425)  Labs: Recent Labs    02/03/19 1435 02/03/19 2040 02/04/19 0220  HGB 13.6  --   --   HCT 43.8  --   --   PLT 210  --   --   LABPROT 28.6*  --  28.8*  INR 2.7*  --  2.8*  CREATININE 2.33*  --  2.41*  TROPONINI <0.03 <0.03 <0.03    Estimated Creatinine Clearance: 44.1 mL/min (A) (by C-G formula based on SCr of 2.41 mg/dL (H)).   Medical History: Past Medical History:  Diagnosis Date  . AAA (abdominal aortic aneurysm)/ 4.5 cm ascending per CT angio 08/01/13 08/12/2013  . Abscess 02/13/2018  . Acute on chronic renal failure (HCC) 01/27/2016   02/15/16 Na 130, K 4.6, Bun 47, creat 2.07    . Acute on chronic respiratory failure ([place]) 01/27/2016  . Acute on chronic respiratory failure with hypoxia ([place]) 01/27/2016   02/14/16 CXR no acute pulmonary abnormality, a nodule density noted at the right upper lobe, could represent a granuloma versus neoplastic lesion, may CT   . Acute renal failure (ARF) ([place]) 08/15/2013  . AKI (acute kidney injury) ([place]) 11/12/2013  . Atrial fibrillation ([place])   . Atrial flutter ([place]) 02/22/2016  . Benign hypertension   . Bradycardia   . BRONCHITIS, CHRONIC 09/01/2007   Qualifier: Diagnosis of  By: Doy Mince LPN, Megan    . Cellulitis of left lower extremity 02/14/2018  . CHF (congestive heart failure), NYHA class II ([place])   . Chronic anticoagulation 02/22/2016  . Chronic combined systolic and diastolic CHF (congestive heart failure) ([place]) 08/04/2013   02/14/16 CXR no acute pulmonary abnormality, a nodule density noted at the right upper lobe, could represent a granuloma versus neoplastic lesion, may CT   .  Chronic respiratory failure ([place]) 02/10/2016  . Chronic ulcer of left heel ([place]) 02/17/2018  . CKD (chronic kidney disease), stage III ([place]) 02/17/2018  . Complete heart block (HCC)    s/p PPM Bi-Ven  . Constipation, chronic 08/03/2013  . Debility 02/10/2016  . Decubitus ulcer of sacral region, stage 3 ([place]) 02/14/2018  . Diabetes mellitus, type 2 ([place])   . H/O medication noncompliance 02/22/2016  . Hernia of abdominal cavity   . Hyperkalemia 11/12/2013  . Hyperlipidemia   . Hypertension   . Hypokalemia   . Hypoxemia   . Hypoxia 01/26/2016  . 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 Kidney failure   . Neuropathy   . Obesity, Class III, BMI 40-49.9 (morbid obesity) ([place]) 09/01/2007   Qualifier: Diagnosis of  By: Doy Mince LPN, Megan    . Obstructive sleep apnea 09/01/2007   Qualifier: Diagnosis of  By: Doy Mince LPN, Megan    . OSA (obstructive sleep apnea)   . Recurrent ventral incisional hernia 08/14/2013  . Right leg pain 08/11/2013  . S/P placement of cardiac pacemaker 02/22/2016  . Sepsis ([place]) 08/02/2013  . Short gut syndrome due to distal jejunal SB fistula 10/22/2013    Medications:  Medications Prior to Admission  Medication Sig Dispense Refill Last Dose  . carvedilol (COREG) 25 MG tablet Take 25 mg by mouth  2 (two) times daily.  3 Past Month at Unknown time  . cyclobenzaprine (FLEXERIL) 5 MG tablet Take 5 mg by mouth 3 (three) times daily.   Past Month at Unknown time  . furosemide (LASIX) 80 MG tablet Take 80 mg by mouth daily.    Past Month at Unknown time  . gabapentin (NEURONTIN) 300 MG capsule Take 300 mg by mouth 3 (three) times daily.    Past Month at Unknown time  . insulin NPH Human (HUMULIN N,NOVOLIN N) 100 UNIT/ML injection Inject 20 units in the morning and 20 units in the evening.   Past Month at Unknown time  . insulin regular (NOVOLIN R,HUMULIN R) 100 units/mL injection Use with sliding scale.   Past Month at  Unknown time  . KLOR-CON M20 20 MEQ tablet Take 20 mg by mouth 2 (two) times daily.  3 Past Month at Unknown time  . metolazone (ZAROXOLYN) 2.5 MG tablet Take 2.5 mg by mouth daily.   Past Month at Unknown time  . rosuvastatin (CRESTOR) 10 MG tablet Take 10 mg by mouth daily.   Past Month at Unknown time  . traZODone (DESYREL) 50 MG tablet Take 100 mg by mouth at bedtime as needed for sleep.   3 Past Month at Unknown time  . warfarin (COUMADIN) 7.5 MG tablet Take 7.5 mg by mouth daily.   02/03/2019 at Unknown time  . Adhesive Tape (ADHESIVE PAPER) TAPE To use with pads/dressing. 100 each 5   . Adhesive Tape TAPE To use with dressings/pads. 100 each 5   . Blood Glucose Monitoring Suppl (ACCU-CHEK AVIVA) device Use as instructed 1 each 0 Taking  . Disposable Gloves MISC To use with wound dressing/changing. 100 each 5   . Gauze Bandages MISC To use daily. 100 each 5   . Gauze Pads & Dressings (ABDOMINAL PAD) 8"X10" PADS 1 each by Does not apply route as needed. 100 each 5   . Gauze Pads & Dressings (AMD FOAM DRESSING) 4"X4" PADS To use daily. 100 each 5   . Gauze Pads & Dressings (KERLIX BANDAGE ROLL) MISC To use daily. 100 each 5   . Skin Protectants, Misc. (EUCERIN) cream Apply topically as needed for wound care. 397 g 5   . spironolactone (ALDACTONE) 25 MG tablet Take 1 tablet (25 mg total) by mouth 2 (two) times daily. (Patient not taking: Reported on 02/03/2019) 30 tablet 0 Not Taking at Unknown time   Scheduled:  . carvedilol  25 mg Oral BID  . cyclobenzaprine  5 mg Oral TID  . furosemide  80 mg Intravenous Q12H  . gabapentin  300 mg Oral TID  . insulin aspart  0-5 Units Subcutaneous QHS  . insulin aspart  0-9 Units Subcutaneous TID WC  . insulin NPH Human  15 Units Subcutaneous BID AC & HS  . living well with diabetes book   Does not apply Once  . mouth rinse  15 mL Mouth Rinse BID  . metolazone  2.5 mg Oral Daily  . potassium chloride SA  20 mEq Oral BID  . rosuvastatin  10 mg Oral  Daily  . sodium chloride flush  3 mL Intravenous Q12H  . spironolactone  25 mg Oral BID  . Warfarin - Pharmacist Dosing Inpatient   Does not apply q1800   PRN: sodium chloride, acetaminophen, hydrocerin, ondansetron (ZOFRAN) IV, sodium chloride flush, traZODone  Assessment: 7 yoM with PMH sCHF, Afib/flutter on warfarin, AAA, CKD3, complete HB s/p pacer, DM2, admitted for AECHF.  Pharmacy to continue warfarin.   Baseline INR 2.7  Prior anticoagulation: warfarin 7.5 mg daily, LD 4/29 - some confusion about last dose since patient has history of noncompliance with other meds, is currently altered and changed his story during the admission. However, given INR 2.7, d/w TRH MD and felt reasonably confident that dose was taken today  Significant events:  Today, 02/04/2019:  INR therapeutic  Major drug interactions: none currently  No bleeding issues per notes  Diet ordered - eating  CBC stable on 4/29  Goal of Therapy: INR 2-3  Plan:  With INR on upper end of INR goal, will give 5mg  warfarin today rather than pt's home dose of 7.5mg   Daily INR  CBC at least q72 hr while on warfarin  Monitor for signs of bleeding or thrombosis   Adrian Saran, PharmD, BCPS 02/04/2019 9:40 AM

## 2019-02-04 NOTE — Progress Notes (Signed)
  Echocardiogram 2D Echocardiogram with definity has been performed.  Kurt Cross M 02/04/2019, 11:20 AM

## 2019-02-04 NOTE — Progress Notes (Signed)
Pt agreeable to wearing CPAP but is nauseas and vomiting at this time.  Will re-evaluate as Pt nausea gets better.

## 2019-02-04 NOTE — Evaluation (Signed)
Occupational Therapy Evaluation Patient Details Name: Kurt Cross MRN: [id number] DOB: 31-Mar-1951 Today's Date: 02/04/2019    History of Present Illness 68 year old man was admitted with SOB/CHF exacerbation.  PMH:  AAA, CKD, Heart block DM, and chronic L heel   Clinical Impression   Pt was admitted for the above.  He usually functions from a w/c level at supervision to min guard level.  He needed assist to sit up today and assistance for leaning and managing pants. Will follow in acute setting with min guard level goals    Follow Up Recommendations  Supervision/Assistance - 24 hour;Home health OT    Equipment Recommendations  None recommended by OT    Recommendations for Other Services       Precautions / Restrictions Precautions Precautions: Fall Restrictions Weight Bearing Restrictions: No      Mobility Bed Mobility Overal bed mobility: Needs Assistance Bed Mobility: Sidelying to Sit;Sit to Supine   Sidelying to sit: Min assist;Mod assist   Sit to supine: Min guard   General bed mobility comments: uses rails and able to scoot up in bed pulling on rails. Assist for trunk to sit up  Transfers                      Balance                                           ADL either performed or assessed with clinical judgement   ADL Overall ADL's : Needs assistance/impaired Eating/Feeding: Independent   Grooming: Set up;Oral care;Sitting   Upper Body Bathing: Supervision/ safety   Lower Body Bathing: Sitting/lateral leans;Bed level;Minimum assistance   Upper Body Dressing : Set up   Lower Body Dressing: Sitting/lateral leans;Bed level;Moderate assistance                 General ADL Comments: pt sat EOB for assessment     Vision         Perception     Praxis      Pertinent Vitals/Pain Pain Assessment: 0-10 Pain Score: 7  Pain Location: bil LEs and kneecaps Pain Descriptors / Indicators: Aching Pain Intervention(s):  Limited activity within patient's tolerance;Monitored during session;Repositioned;Patient requesting pain meds-RN notified     Hand Dominance     Extremity/Trunk Assessment Upper Extremity Assessment Upper Extremity Assessment: Overall WFL for tasks assessed           Communication Communication Communication: No difficulties   Cognition Arousal/Alertness: Awake/alert Behavior During Therapy: WFL for tasks assessed/performed Overall Cognitive Status: Within Functional Limits for tasks assessed                                     General Comments  pt does not wear 02 at home.  On 2 liters; sats 90/91.  Pt tends to push through activities. Cued for rest breaks    Exercises     Shoulder Instructions      Home Living Family/patient expects to be discharged to:: Private residence Living Arrangements: Spouse/significant other Available Help at Discharge: Family Type of Home: House Home Access: Level entry     Home Layout: One level     Bathroom Shower/Tub: Teacher, early years/pre: Handicapped height     Home Equipment: Environmental consultant -  4 wheels;Hospital bed;Bedside commode;Tub bench          Prior Functioning/Environment Level of Independence: Needs assistance  Gait / Transfers Assistance Needed: pt has been using a w/c. Uses sliding board     Comments: wife supervises.  Uses AE for dressing. Pt states he has been using sliding board since rehab admission, last year        OT Problem List: Decreased strength;Decreased activity tolerance;Decreased knowledge of use of DME or AE;Pain;Cardiopulmonary status limiting activity      OT Treatment/Interventions: Self-care/ADL training;Energy conservation;DME and/or AE instruction;Balance training;Patient/family education;Therapeutic activities    OT Goals(Current goals can be found in the care plan section) Acute Rehab OT Goals Patient Stated Goal: be able to stand and walk OT Goal Formulation:  With patient Time For Goal Achievement: 02/18/19 Potential to Achieve Goals: Good ADL Goals Pt Will Transfer to Toilet: with min guard assist;bedside commode;with transfer board(drop arm) Additional ADL Goal #1: pt will perform adls with set up/ min guard with AE and leaning to advance pants/wash buttocks Additional ADL Goal #2: pt will initiate at least one rest break for energy conservation  OT Frequency: Min 2X/week   Barriers to D/C:            Co-evaluation              AM-PAC OT "6 Clicks" Daily Activity     Outcome Measure Help from another person eating meals?: None Help from another person taking care of personal grooming?: A Little Help from another person toileting, which includes using toliet, bedpan, or urinal?: A Lot Help from another person bathing (including washing, rinsing, drying)?: A Lot Help from another person to put on and taking off regular upper body clothing?: A Little Help from another person to put on and taking off regular lower body clothing?: A Lot 6 Click Score: 16   End of Session    Activity Tolerance: Patient tolerated treatment well Patient left: in bed;with call bell/phone within reach;with bed alarm set;with nursing/sitter in room  OT Visit Diagnosis: Muscle weakness (generalized) (M62.81)                Time: 3329-5188 OT Time Calculation (min): 41 min Charges:  OT General Charges $OT Visit: 1 Visit OT Evaluation $OT Eval Low Complexity: 1 Low OT Treatments $Therapeutic Activity: 23-37 mins  [name], OTR/L Acute Rehabilitation Services [phone] [facility] pager [phone] office 02/04/2019  Carmi 02/04/2019, 9:52 AM

## 2019-02-04 NOTE — Progress Notes (Signed)
Nutrition Education Note RD working remotely.   RD consulted for nutrition education regarding new onset CHF.  Will place handouts from the Academy of Nutrition and Dietetics in Discharge Instructions: "Low Sodium Nutrition Therapy," "Sodium-Free Flavoring Tips," and "Sodium Content of Foods."  Discouraged intake of processed foods and use of salt shaker. Encouraged fresh fruits and vegetables as well as whole grain sources of carbohydrates to maximize fiber intake.   Expect fair compliance.  Body mass index is 43.66 kg/m. Pt meets criteria for morbid obesity based on current BMI.  Current diet order is Heart Healthy, 1.2L fluid restriction, patient is consuming approximately 100% of meals at this time. Labs and medications reviewed. No further nutrition interventions warranted at this time. RD contact information provided. If additional nutrition issues arise, please re-consult RD.      Jarome Matin, MS, RD, LDN, [facility] Inpatient Clinical Dietitian Pager # [phone] After hours/weekend pager # [phone]

## 2019-02-05 ENCOUNTER — Inpatient Hospital Stay (HOSPITAL_COMMUNITY): Payer: Medicare HMO

## 2019-02-05 DIAGNOSIS — G4733 Obstructive sleep apnea (adult) (pediatric): Secondary | ICD-10-CM

## 2019-02-05 DIAGNOSIS — K6389 Other specified diseases of intestine: Secondary | ICD-10-CM | POA: Diagnosis not present

## 2019-02-05 DIAGNOSIS — I5043 Acute on chronic combined systolic (congestive) and diastolic (congestive) heart failure: Secondary | ICD-10-CM

## 2019-02-05 DIAGNOSIS — R112 Nausea with vomiting, unspecified: Secondary | ICD-10-CM

## 2019-02-05 LAB — BASIC METABOLIC PANEL
Anion gap: 11 (ref 5–15)
BUN: 33 mg/dL — ABNORMAL HIGH (ref 8–23)
CO2: 30 mmol/L (ref 22–32)
Calcium: 8.4 mg/dL — ABNORMAL LOW (ref 8.9–10.3)
Chloride: 102 mmol/L (ref 98–111)
Creatinine, Ser: 2.49 mg/dL — ABNORMAL HIGH (ref 0.61–1.24)
GFR calc Af Amer: 30 mL/min — ABNORMAL LOW (ref >60)
GFR calc non Af Amer: 26 mL/min — ABNORMAL LOW (ref >60)
Glucose, Bld: 140 mg/dL — ABNORMAL HIGH (ref 70–99)
Potassium: 3.9 mmol/L (ref 3.5–5.1)
Sodium: 143 mmol/L (ref 135–145)

## 2019-02-05 LAB — URINALYSIS, ROUTINE W REFLEX MICROSCOPIC
Bilirubin Urine: NEGATIVE
Glucose, UA: NEGATIVE mg/dL
Ketones, ur: NEGATIVE mg/dL
Nitrite: NEGATIVE
Protein, ur: 100 mg/dL — AB
Specific Gravity, Urine: 1.008 (ref 1.005–1.030)
WBC, UA: >50 WBC/hpf — ABNORMAL HIGH (ref 0–5)
pH: 5 (ref 5.0–8.0)

## 2019-02-05 LAB — GLUCOSE, CAPILLARY
Glucose-Capillary: 133 mg/dL — ABNORMAL HIGH (ref 70–99)
Glucose-Capillary: 130 mg/dL — ABNORMAL HIGH (ref 70–99)
Glucose-Capillary: 109 mg/dL — ABNORMAL HIGH (ref 70–99)
Glucose-Capillary: 73 mg/dL (ref 70–99)
Glucose-Capillary: 113 mg/dL — ABNORMAL HIGH (ref 70–99)
Glucose-Capillary: 62 mg/dL — ABNORMAL LOW (ref 70–99)
Glucose-Capillary: 74 mg/dL (ref 70–99)
Glucose-Capillary: 149 mg/dL — ABNORMAL HIGH (ref 70–99)
Glucose-Capillary: 131 mg/dL — ABNORMAL HIGH (ref 70–99)

## 2019-02-05 LAB — LACTIC ACID, PLASMA: Lactic Acid, Venous: 1.1 mmol/L (ref 0.5–1.9)

## 2019-02-05 LAB — MAGNESIUM: Magnesium: 1.9 mg/dL (ref 1.7–2.4)

## 2019-02-05 LAB — PROTIME-INR
INR: 2.6 — ABNORMAL HIGH (ref 0.8–1.2)
Prothrombin Time: 27.3 seconds — ABNORMAL HIGH (ref 11.4–15.2)

## 2019-02-05 MED ORDER — ISOSORBIDE DINITRATE 5 MG PO TABS
5.0000 mg | ORAL_TABLET | Freq: Three times a day (TID) | ORAL | Status: DC
  Filled 2019-02-05: qty 1

## 2019-02-05 MED ORDER — HYDRALAZINE HCL 10 MG PO TABS: 10.0000 mg | ORAL_TABLET | Freq: Three times a day (TID) | ORAL | Status: DC

## 2019-02-05 MED ORDER — DEXTROSE 50 % IV SOLN
INTRAVENOUS | Status: AC
  Filled 2019-02-05: qty 50

## 2019-02-05 MED ORDER — HYDROCOD POLST-CPM POLST ER 10-8 MG/5ML PO SUER: 5.0000 mL | Freq: Once | ORAL | Status: DC

## 2019-02-05 MED ORDER — PANTOPRAZOLE SODIUM 40 MG IV SOLR
40.0000 mg | INTRAVENOUS | Status: DC
  Administered 2019-02-05 – 2019-02-08 (×4): 40 mg via INTRAVENOUS
  Filled 2019-02-05 (×4): qty 40

## 2019-02-05 MED ORDER — DEXTROSE 50 % IV SOLN
25.0000 g | INTRAVENOUS | Status: AC
  Administered 2019-02-05: 22:00:00 25 g via INTRAVENOUS
  Filled 2019-02-05: qty 50

## 2019-02-05 MED ORDER — INSULIN ASPART 100 UNIT/ML ~~LOC~~ SOLN
0.0000 [IU] | SUBCUTANEOUS | Status: DC
  Administered 2019-02-06 – 2019-02-07 (×3): 2 [IU] via SUBCUTANEOUS
  Administered 2019-02-07: 17:00:00 3 [IU] via SUBCUTANEOUS
  Administered 2019-02-07: 4 [IU] via SUBCUTANEOUS
  Administered 2019-02-07: 3 [IU] via SUBCUTANEOUS
  Administered 2019-02-08: 2 [IU] via SUBCUTANEOUS
  Administered 2019-02-08: 4 [IU] via SUBCUTANEOUS
  Administered 2019-02-08: 5 [IU] via SUBCUTANEOUS
  Administered 2019-02-08: 2 [IU] via SUBCUTANEOUS
  Administered 2019-02-08 – 2019-02-09 (×2): 3 [IU] via SUBCUTANEOUS
  Administered 2019-02-09 (×2): 2 [IU] via SUBCUTANEOUS
  Administered 2019-02-09 – 2019-02-10 (×2): 3 [IU] via SUBCUTANEOUS
  Administered 2019-02-10 (×3): 2 [IU] via SUBCUTANEOUS
  Administered 2019-02-11: 21:00:00 4 [IU] via SUBCUTANEOUS
  Administered 2019-02-11 – 2019-02-12 (×2): 3 [IU] via SUBCUTANEOUS
  Administered 2019-02-12 (×2): 2 [IU] via SUBCUTANEOUS

## 2019-02-05 MED ORDER — PIPERACILLIN-TAZOBACTAM 3.375 G IVPB
3.3750 g | Freq: Three times a day (TID) | INTRAVENOUS | Status: DC
  Administered 2019-02-05 – 2019-02-07 (×7): 3.375 g via INTRAVENOUS
  Filled 2019-02-05 (×7): qty 50

## 2019-02-05 MED ORDER — METOPROLOL TARTRATE 5 MG/5ML IV SOLN
5.0000 mg | Freq: Three times a day (TID) | INTRAVENOUS | Status: DC
  Administered 2019-02-05 – 2019-02-06 (×3): 5 mg via INTRAVENOUS
  Filled 2019-02-05 (×3): qty 5

## 2019-02-05 MED ORDER — WARFARIN SODIUM 5 MG PO TABS
7.5000 mg | ORAL_TABLET | Freq: Once | ORAL | Status: DC
  Filled 2019-02-05: qty 1

## 2019-02-05 MED ORDER — FUROSEMIDE 10 MG/ML IJ SOLN: 80.0000 mg | Freq: Every day | INTRAMUSCULAR | Status: DC

## 2019-02-05 MED ORDER — DEXTROSE 50 % IV SOLN
12.5000 g | INTRAVENOUS | Status: AC
  Administered 2019-02-05: 12.5 g via INTRAVENOUS

## 2019-02-05 NOTE — TOC Progression Note (Signed)
Transition of Care [facility]) - Progression Note    Patient Details  Name: Kurt Cross MRN: [id number] Date of Birth: 04/14/1951  Transition of Care [facility]) CM/SW Contact  Corrissa Martello, Juliann Pulse, RN Phone Number: 02/05/2019, 3:30 PM  Clinical Narrative: Spoke to spouse on phone d/c plan home w/HHC-already active w/[employer]-rep ellen following-recc-HHRN/PT/OT/aide/social worker. Spouse states her concerns with patient's CPAP, & w/c not working properly-informed [employer] rep Zach to f/u.Spouse says [facility]AR @ d/c for transport home. On 02 in hospital-will monitor if needed @ home. Noted cardiology, gen surgery following. Patient refusing NGT.      Expected Discharge Plan: [place] Barriers to Discharge: Continued Medical Work up  Expected Discharge Plan and Services Expected Discharge Plan: [place]   Discharge Planning Services: CM Consult   Living arrangements for the past 2 months: Single Family Home Expected Discharge Date: (unknown)                             Date HH Agency Contacted: 02/04/19 Time [place]: 0930 Representative spoke with at [place]: Daine Gip   Social Determinants of Health (SDOH) Interventions    Readmission Risk Interventions No flowsheet data found.

## 2019-02-05 NOTE — Progress Notes (Signed)
Patient began feeling nauseous and vomited around 8pm this shift, no relief from IV Zofran. Raliegh Ip Schorr NP notified and new order received for 12.5mg  of IV Phenergan. Given and still no relief, as patient continued to vomit. Patient had a large bowel movement and his abdomen was distended and very taut. Lamar Blinks NP notified and new orders were received and followed out. Will continue with plan of care.

## 2019-02-05 NOTE — Progress Notes (Signed)
Pt. States that he does not want to wear CPAP device this evening.  There is no machine in room at this time.  Will be available if patient changes his mind.

## 2019-02-05 NOTE — Progress Notes (Signed)
OT Cancellation Note  Patient Details Name: Kurt Cross MRN: [id number] DOB: [birth year]   Cancelled Treatment:    Reason Eval/Treat Not Completed: Other (comment). Spoke to [employer].  Pt drowsy today and still with some vomiting. Will check back another day.  [name] 02/05/2019, 11:12 AM  [name], OTR/L Acute Rehabilitation Services [phone] [facility] pager [phone] office 02/05/2019

## 2019-02-05 NOTE — Progress Notes (Signed)
Surgeon Remo Lipps notified through text page pt is refusing NGT along with MD Grandville Silos.

## 2019-02-05 NOTE — Progress Notes (Signed)
[place] for warfarin Indication: Afib/flutter  No Known Allergies  Patient Measurements: Height: 5\' 11"  (180.3 cm) Weight: (!) 313 lb 0.9 oz (142 kg) IBW/kg (Calculated) : 75.3  Vital Signs: Temp: 98.6 F (37 C) (04/30 2153) Temp Source: Oral (04/30 2153) BP: 140/81 (05/01 0650) Pulse Rate: 70 (05/01 0614)  Labs: Recent Labs    02/03/19 1435 02/03/19 2040 02/04/19 0220 02/04/19 0956 02/05/19 0502  HGB 13.6  --   --   --   --   HCT 43.8  --   --   --   --   PLT 210  --   --   --   --   LABPROT 28.6*  --  28.8*  --  27.3*  INR 2.7*  --  2.8*  --  2.6*  CREATININE 2.33*  --  2.41*  --  2.49*  TROPONINI <0.03 <0.03 <0.03 <0.03  --     Estimated Creatinine Clearance: 42.7 mL/min (A) (by C-G formula based on SCr of 2.49 mg/dL (H)).   Medical History: Past Medical History:  Diagnosis Date  . AAA (abdominal aortic aneurysm)/ 4.5 cm ascending per CT angio 08/01/13 08/12/2013  . Abscess 02/13/2018  . Acute on chronic renal failure (HCC) 01/27/2016   02/15/16 Na 130, K 4.6, Bun 47, creat 2.07    . Acute on chronic respiratory failure ([place]) 01/27/2016  . Acute on chronic respiratory failure with hypoxia ([place]) 01/27/2016   02/14/16 CXR no acute pulmonary abnormality, a nodule density noted at the right upper lobe, could represent a granuloma versus neoplastic lesion, may CT   . Acute renal failure (ARF) ([place]) 08/15/2013  . AKI (acute kidney injury) ([place]) 11/12/2013  . Atrial fibrillation ([place])   . Atrial flutter ([place]) 02/22/2016  . Benign hypertension   . Bradycardia   . BRONCHITIS, CHRONIC 09/01/2007   Qualifier: Diagnosis of  By: Doy Mince LPN, Megan    . Cellulitis of left lower extremity 02/14/2018  . CHF (congestive heart failure), NYHA class II ([place])   . Chronic anticoagulation 02/22/2016  . Chronic combined systolic and diastolic CHF (congestive heart failure) ([place]) 08/04/2013   02/14/16 CXR no acute pulmonary abnormality, a nodule  density noted at the right upper lobe, could represent a granuloma versus neoplastic lesion, may CT   . Chronic respiratory failure ([place]) 02/10/2016  . Chronic ulcer of left heel ([place]) 02/17/2018  . CKD (chronic kidney disease), stage III ([place]) 02/17/2018  . Complete heart block (HCC)    s/p PPM Bi-Ven  . Constipation, chronic 08/03/2013  . Debility 02/10/2016  . Decubitus ulcer of sacral region, stage 3 ([place]) 02/14/2018  . Diabetes mellitus, type 2 ([place])   . H/O medication noncompliance 02/22/2016  . Hernia of abdominal cavity   . Hyperkalemia 11/12/2013  . Hyperlipidemia   . Hypertension   . Hypokalemia   . Hypoxemia   . Hypoxia 01/26/2016  . 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 Kidney failure   . Neuropathy   . Obesity, Class III, BMI 40-49.9 (morbid obesity) ([place]) 09/01/2007   Qualifier: Diagnosis of  By: Doy Mince LPN, Megan    . Obstructive sleep apnea 09/01/2007   Qualifier: Diagnosis of  By: Doy Mince LPN, Megan    . OSA (obstructive sleep apnea)   . Recurrent ventral incisional hernia 08/14/2013  . Right leg pain 08/11/2013  . S/P placement of cardiac pacemaker 02/22/2016  . Sepsis ([place]) 08/02/2013  .  Short gut syndrome due to distal jejunal SB fistula 10/22/2013    Medications:  Medications Prior to Admission  Medication Sig Dispense Refill Last Dose  . carvedilol (COREG) 25 MG tablet Take 25 mg by mouth 2 (two) times daily.  3 Past Month at Unknown time  . cyclobenzaprine (FLEXERIL) 5 MG tablet Take 5 mg by mouth 3 (three) times daily.   Past Month at Unknown time  . furosemide (LASIX) 80 MG tablet Take 80 mg by mouth daily.    Past Month at Unknown time  . gabapentin (NEURONTIN) 300 MG capsule Take 300 mg by mouth 3 (three) times daily.    Past Month at Unknown time  . insulin NPH Human (HUMULIN N,NOVOLIN N) 100 UNIT/ML injection Inject 20 units in the morning and 20 units in the evening.   Past Month at Unknown time  .  insulin regular (NOVOLIN R,HUMULIN R) 100 units/mL injection Use with sliding scale.   Past Month at Unknown time  . KLOR-CON M20 20 MEQ tablet Take 20 mg by mouth 2 (two) times daily.  3 Past Month at Unknown time  . metolazone (ZAROXOLYN) 2.5 MG tablet Take 2.5 mg by mouth daily.   Past Month at Unknown time  . rosuvastatin (CRESTOR) 10 MG tablet Take 10 mg by mouth daily.   Past Month at Unknown time  . traZODone (DESYREL) 50 MG tablet Take 100 mg by mouth at bedtime as needed for sleep.   3 Past Month at Unknown time  . warfarin (COUMADIN) 7.5 MG tablet Take 7.5 mg by mouth daily.   02/03/2019 at Unknown time  . Adhesive Tape (ADHESIVE PAPER) TAPE To use with pads/dressing. 100 each 5   . Adhesive Tape TAPE To use with dressings/pads. 100 each 5   . Blood Glucose Monitoring Suppl (ACCU-CHEK AVIVA) device Use as instructed 1 each 0 Taking  . Disposable Gloves MISC To use with wound dressing/changing. 100 each 5   . Gauze Bandages MISC To use daily. 100 each 5   . Gauze Pads & Dressings (ABDOMINAL PAD) 8"X10" PADS 1 each by Does not apply route as needed. 100 each 5   . Gauze Pads & Dressings (AMD FOAM DRESSING) 4"X4" PADS To use daily. 100 each 5   . Gauze Pads & Dressings (KERLIX BANDAGE ROLL) MISC To use daily. 100 each 5   . Skin Protectants, Misc. (EUCERIN) cream Apply topically as needed for wound care. 397 g 5   . spironolactone (ALDACTONE) 25 MG tablet Take 1 tablet (25 mg total) by mouth 2 (two) times daily. (Patient not taking: Reported on 02/03/2019) 30 tablet 0 Not Taking at Unknown time   Scheduled:  . Chlorhexidine Gluconate Cloth  6 each Topical Q0600  . chlorpheniramine-HYDROcodone  5 mL Oral Once  . furosemide  80 mg Intravenous Q12H  . gabapentin  300 mg Oral TID  . insulin aspart  0-5 Units Subcutaneous Q4H  . mouth rinse  15 mL Mouth Rinse BID  . metolazone  2.5 mg Oral Daily  . metoprolol tartrate  5 mg Intravenous Q8H  . mupirocin ointment  1 application Nasal BID  .  potassium chloride SA  20 mEq Oral BID  . sodium chloride flush  3 mL Intravenous Q12H  . spironolactone  25 mg Oral BID  . Warfarin - Pharmacist Dosing Inpatient   Does not apply q1800   PRN: sodium chloride, acetaminophen, hydrocerin, ondansetron (ZOFRAN) IV, sodium chloride flush, traZODone  Assessment: 9 yoM with PMH  sCHF, Afib/flutter on warfarin, AAA, CKD3, complete HB s/p pacer, DM2, admitted for AECHF. Pharmacy to continue warfarin.   Baseline INR 2.7  Prior anticoagulation: warfarin 7.5 mg daily, LD 4/29 - some confusion about last dose since patient has history of noncompliance with other meds, is currently altered and changed his story during the admission. However, given INR 2.7, d/w TRH MD and felt reasonably confident that dose was taken today  Significant events:  Today, 02/05/2019:  INR therapeutic again today  Major drug interactions: none currently  No bleeding issues per notes  Diet ordered - not eating  CBC stable on 4/29 - order CBC for 5/2  Goal of Therapy: INR 2-3  Plan:  Warfarin 7.5mg  today  Daily INR  CBC at least q72 hr while on warfarin - due 5/2  Monitor for signs of bleeding or thrombosis   Adrian Saran, PharmD, BCPS 02/05/2019 9:11 AM

## 2019-02-05 NOTE — Consult Note (Signed)
[facility] Surgery Consult Note  Kurt Cross 1951-04-09  [id number].    Requesting MD: Irine Seal Chief Complaint: New abdominal pain Reason for Consult: CT showing moderate distended stomach with gastric pneumatosis small volume of portal gas with a history of repetitive vomiting.  Regional inflammation without pneumoperitoneum   HPI:  Patient is a 68 year old male who was admitted with congestive heart failure.  Developed abdominal pain last evening got a CT scan showed a moderately distended stomach with gastric pneumatosis small volume of portal gas with a history of repetitive vomiting.  He has some regional inflammation without pneumoperitoneum.  He had a prolonged hospitalization in 2014 after presenting with abdominal abscess.  Was taken the operating room and had a giant abdominal abscess with old infected Gore-Tex mesh.  He underwent incision and drainage of the abscess Gore-Tex mesh removal and debridement of the abdominal wall and peritoneum on 08/14/2013 by Dr. Alwyn Pea.  Hospitalized 08/11/2013 through 10/15/2013.   Patient has a history of significant chronic systolic heart failure, atrial fib/flutter, abdominal arctic aneurysm, chronic kidney disease stage III, complete heart block with a permanent transvenous pacemaker, chronic constipation, type 2 diabetes chronic nonhealing heel wounds he presented with an 8 to 10 pound weight gain over 2-week.  We are asked to see.  ROS: Review of Systems  Unable to perform ROS: Other  Constitutional:       Patient sedated, he is following commands, minimal speech response and it is garbled and we cannot understand it.    Family History  Problem Relation Age of Onset  . Diabetes Mother   . Alcoholism Father   . Heart Problems Maternal Grandmother     Past Medical History:  Diagnosis Date  . AAA (abdominal aortic aneurysm)/ 4.5 cm ascending per CT angio 08/01/13 08/12/2013  . Abscess 02/13/2018  . Acute on chronic  renal failure (HCC) 01/27/2016   02/15/16 Na 130, K 4.6, Bun 47, creat 2.07    . Acute on chronic respiratory failure ([place]) 01/27/2016  . Acute on chronic respiratory failure with hypoxia ([place]) 01/27/2016   02/14/16 CXR no acute pulmonary abnormality, a nodule density noted at the right upper lobe, could represent a granuloma versus neoplastic lesion, may CT   . Acute renal failure (ARF) ([place]) 08/15/2013  . AKI (acute kidney injury) ([place]) 11/12/2013  . Atrial fibrillation ([place])   . Atrial flutter ([place]) 02/22/2016  . Benign hypertension   . Bradycardia   . BRONCHITIS, CHRONIC 09/01/2007   Qualifier: Diagnosis of  By: Doy Mince LPN, Megan    . Cellulitis of left lower extremity 02/14/2018  . CHF (congestive heart failure), NYHA class II ([place])   . Chronic anticoagulation 02/22/2016  . Chronic combined systolic and diastolic CHF (congestive heart failure) ([place]) 08/04/2013   02/14/16 CXR no acute pulmonary abnormality, a nodule density noted at the right upper lobe, could represent a granuloma versus neoplastic lesion, may CT   . Chronic respiratory failure ([place]) 02/10/2016  . Chronic ulcer of left heel ([place]) 02/17/2018  . CKD (chronic kidney disease), stage III ([place]) 02/17/2018  . Complete heart block (HCC)    s/p PPM Bi-Ven  . Constipation, chronic 08/03/2013  . Debility 02/10/2016  . Decubitus ulcer of sacral region, stage 3 ([place]) 02/14/2018  . Diabetes mellitus, type 2 ([place])   . H/O medication noncompliance 02/22/2016  . Hernia of abdominal cavity   . Hyperkalemia 11/12/2013  . Hyperlipidemia   . Hypertension   . Hypokalemia   . Hypoxemia   .  Hypoxia 01/26/2016  . 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 Kidney failure   . Neuropathy   . Obesity, Class III, BMI 40-49.9 (morbid obesity) ([place]) 09/01/2007   Qualifier: Diagnosis of  By: Doy Mince LPN, Megan    . Obstructive sleep apnea 09/01/2007   Qualifier: Diagnosis of  By: Doy Mince LPN, Megan     . OSA (obstructive sleep apnea)   . Recurrent ventral incisional hernia 08/14/2013  . Right leg pain 08/11/2013  . S/P placement of cardiac pacemaker 02/22/2016  . Sepsis ([place]) 08/02/2013  . Short gut syndrome due to distal jejunal SB fistula 10/22/2013    Past Surgical History:  Procedure Laterality Date  . APPENDECTOMY    . biventricular pacemaker generator change  04/29/2014   MDT Viva CRT-P BiV pacemaker implanted by Dr Rolla Etienne in [place]  . biventricular pacemaker implantation  06/18/2007   MDT BIV pacemaker implanted by Dr Lovena Le  . BOWEL RESECTION  1980s?   ?colectomy/ostomy - Dr Elesa Hacker  . INCISION AND DRAINAGE ABSCESS N/A 08/14/2013   Procedure: INCISION AND DRAINAGE ABSCESS, mesh ;  Surgeon: [name], MD;  Location: [facility] ORS;  Service: General;  Laterality: N/A;  . [place]?   Dr. Lindon Romp - w mesh  . KNEE SURGERY Right   . LEFT HEART CATHETERIZATION WITH CORONARY ANGIOGRAM N/A 03/17/2014   Procedure: LEFT HEART CATHETERIZATION WITH CORONARY ANGIOGRAM;  Surgeon: [name], MD;  Location: [facility] CATH LAB;  Service: Cardiovascular;  Laterality: N/A;  . NASAL SEPTUM SURGERY    . OSTOMY TAKE DOWN  1980s?   Dr Elesa Hacker  . TONSILLECTOMY AND ADENOIDECTOMY      Social History:  reports that he quit smoking about 6 years ago. His smoking use included cigarettes. He has a 40.00 pack-year smoking history. He has never used smokeless tobacco. He reports current alcohol use. He reports that he does not use drugs.  Allergies: No Known Allergies  Medications Prior to Admission  Medication Sig Dispense Refill  . carvedilol (COREG) 25 MG tablet Take 25 mg by mouth 2 (two) times daily.  3  . cyclobenzaprine (FLEXERIL) 5 MG tablet Take 5 mg by mouth 3 (three) times daily.    . furosemide (LASIX) 80 MG tablet Take 80 mg by mouth daily.     [name] Kitchen gabapentin (NEURONTIN) 300 MG capsule Take 300 mg by mouth 3 (three) times daily.     . insulin NPH Human (HUMULIN  N,NOVOLIN N) 100 UNIT/ML injection Inject 20 units in the morning and 20 units in the evening.    . insulin regular (NOVOLIN R,HUMULIN R) 100 units/mL injection Use with sliding scale.    [name] Kitchen KLOR-CON M20 20 MEQ tablet Take 20 mg by mouth 2 (two) times daily.  3  . metolazone (ZAROXOLYN) 2.5 MG tablet Take 2.5 mg by mouth daily.    . rosuvastatin (CRESTOR) 10 MG tablet Take 10 mg by mouth daily.    . traZODone (DESYREL) 50 MG tablet Take 100 mg by mouth at bedtime as needed for sleep.   3  . warfarin (COUMADIN) 7.5 MG tablet Take 7.5 mg by mouth daily.    . Adhesive Tape (ADHESIVE PAPER) TAPE To use with pads/dressing. 100 each 5  . Adhesive Tape TAPE To use with dressings/pads. 100 each 5  . Blood Glucose Monitoring Suppl (ACCU-CHEK AVIVA) device Use as instructed 1 each 0  . Disposable Gloves MISC To use with  wound dressing/changing. 100 each 5  . Gauze Bandages MISC To use daily. 100 each 5  . Gauze Pads & Dressings (ABDOMINAL PAD) 8"X10" PADS 1 each by Does not apply route as needed. 100 each 5  . Gauze Pads & Dressings (AMD FOAM DRESSING) 4"X4" PADS To use daily. 100 each 5  . Gauze Pads & Dressings (KERLIX BANDAGE ROLL) MISC To use daily. 100 each 5  . Skin Protectants, Misc. (EUCERIN) cream Apply topically as needed for wound care. 397 g 5  . spironolactone (ALDACTONE) 25 MG tablet Take 1 tablet (25 mg total) by mouth 2 (two) times daily. (Patient not taking: Reported on 02/03/2019) 30 tablet 0   . sodium chloride     No current facility-administered medications on file prior to encounter.    Current Outpatient Medications on File Prior to Encounter  Medication Sig Dispense Refill  . carvedilol (COREG) 25 MG tablet Take 25 mg by mouth 2 (two) times daily.  3  . cyclobenzaprine (FLEXERIL) 5 MG tablet Take 5 mg by mouth 3 (three) times daily.    . furosemide (LASIX) 80 MG tablet Take 80 mg by mouth daily.     [name] Kitchen gabapentin (NEURONTIN) 300 MG capsule Take 300 mg by mouth 3 (three) times  daily.     . insulin NPH Human (HUMULIN N,NOVOLIN N) 100 UNIT/ML injection Inject 20 units in the morning and 20 units in the evening.    . insulin regular (NOVOLIN R,HUMULIN R) 100 units/mL injection Use with sliding scale.    [name] Kitchen KLOR-CON M20 20 MEQ tablet Take 20 mg by mouth 2 (two) times daily.  3  . metolazone (ZAROXOLYN) 2.5 MG tablet Take 2.5 mg by mouth daily.    . rosuvastatin (CRESTOR) 10 MG tablet Take 10 mg by mouth daily.    . traZODone (DESYREL) 50 MG tablet Take 100 mg by mouth at bedtime as needed for sleep.   3  . warfarin (COUMADIN) 7.5 MG tablet Take 7.5 mg by mouth daily.    . Adhesive Tape (ADHESIVE PAPER) TAPE To use with pads/dressing. 100 each 5  . Adhesive Tape TAPE To use with dressings/pads. 100 each 5  . Blood Glucose Monitoring Suppl (ACCU-CHEK AVIVA) device Use as instructed 1 each 0  . Disposable Gloves MISC To use with wound dressing/changing. 100 each 5  . Gauze Bandages MISC To use daily. 100 each 5  . Gauze Pads & Dressings (ABDOMINAL PAD) 8"X10" PADS 1 each by Does not apply route as needed. 100 each 5  . Gauze Pads & Dressings (AMD FOAM DRESSING) 4"X4" PADS To use daily. 100 each 5  . Gauze Pads & Dressings (KERLIX BANDAGE ROLL) MISC To use daily. 100 each 5  . Skin Protectants, Misc. (EUCERIN) cream Apply topically as needed for wound care. 397 g 5  . spironolactone (ALDACTONE) 25 MG tablet Take 1 tablet (25 mg total) by mouth 2 (two) times daily. (Patient not taking: Reported on 02/03/2019) 30 tablet 0   Blood pressure 140/81, pulse 70, temperature 98.6 F (37 C), temperature source Oral, resp. rate 20, height 5\' 11"  (1.803 m), weight (!) 142 kg, SpO2 94 %. Physical Exam: Physical Exam Constitutional:      General: He is not in acute distress.    Appearance: He is not toxic-appearing.     Comments: Patient received Ativan 1 mg earlier this a.m. and is following commands but sleeping through the entire exam.  Minimal speech response, only with yelling him,  his  speech is garbled, and we could not understand him.  We checked his blood pressure is 141/75, heart rate 69, O2 sats 99%  HENT:     Head: Normocephalic and atraumatic.  Eyes:     Comments: Pupils are equal  Neck:     Thyroid: No thyromegaly.     Vascular: No JVD.  Cardiovascular:     Rate and Rhythm: Normal rate and regular rhythm.     Pulses: Decreased pulses.     Heart sounds: No murmur.  Pulmonary:     Effort: Pulmonary effort is normal.     Breath sounds: Normal breath sounds.  Chest:     Chest wall: No mass, deformity, tenderness, crepitus or edema. There is no dullness to percussion.  Abdominal:     Palpations: Abdomen is soft. There is no hepatomegaly, splenomegaly or mass.     Tenderness: There is no abdominal tenderness. There is no guarding or rebound.     Comments: Abdomen soft nontender multiple scars there is a picture below.  EC fistula is not present.  There is no tenderness on palpation.  Morbidly obese abdomen.  Musculoskeletal:     Right lower leg: He exhibits no tenderness. No edema.     Left lower leg: He exhibits no tenderness. No edema.  Lymphadenopathy:     Cervical: No cervical adenopathy.  Skin:    General: Skin is warm and dry.  Neurological:     Comments: Patient is sedated he can move all extremities on command.  He can verbalize but his speech is garbled and cannot be understood.  Psychiatric:     Comments: Could not evaluate     Results for orders placed or performed during the hospital encounter of 02/03/19 (from the past 48 hour(s))  Basic metabolic panel     Status: Abnormal   Collection Time: 02/03/19  2:35 PM  Result Value Ref Range   Sodium 140 135 - 145 mmol/L   Potassium 4.3 3.5 - 5.1 mmol/L   Chloride 105 98 - 111 mmol/L   CO2 28 22 - 32 mmol/L   Glucose, Bld 263 (H) 70 - 99 mg/dL   BUN 31 (H) 8 - 23 mg/dL   Creatinine, Ser 2.33 (H) 0.61 - 1.24 mg/dL   Calcium 8.3 (L) 8.9 - 10.3 mg/dL   GFR calc non Af Amer 28 (L) >60 mL/min    GFR calc Af Amer 33 (L) >60 mL/min   Anion gap 7 5 - 15    Comment: Performed at [facility], [place] [address], [place], [place] [address]  CBC     Status: Abnormal   Collection Time: 02/03/19  2:35 PM  Result Value Ref Range   WBC 8.5 4.0 - 10.5 K/uL   RBC 4.29 4.22 - 5.81 MIL/uL   Hemoglobin 13.6 13.0 - 17.0 g/dL   HCT 43.8 39.0 - 52.0 %   MCV 102.1 (H) 80.0 - 100.0 fL   MCH 31.7 26.0 - 34.0 pg   MCHC 31.1 30.0 - 36.0 g/dL   RDW 17.2 (H) 11.5 - 15.5 %   Platelets 210 150 - 400 K/uL   nRBC 0.0 0.0 - 0.2 %    Comment: Performed at [facility], [place] [address], [place], [place] [address]  Brain natriuretic peptide     Status: Abnormal   Collection Time: 02/03/19  2:35 PM  Result Value Ref Range   B Natriuretic Peptide 194.2 (H) 0.0 - 100.0 pg/mL  Comment: Performed at [facility], [place] [address], [place], [place] [address]  Hepatic function panel     Status: Abnormal   Collection Time: 02/03/19  2:35 PM  Result Value Ref Range   Total Protein 6.4 (L) 6.5 - 8.1 g/dL   Albumin 2.7 (L) 3.5 - 5.0 g/dL   AST 12 (L) 15 - 41 U/L   ALT 10 0 - 44 U/L   Alkaline Phosphatase 33 (L) 38 - 126 U/L   Total Bilirubin 0.8 0.3 - 1.2 mg/dL   Bilirubin, Direct 0.2 0.0 - 0.2 mg/dL   Indirect Bilirubin 0.6 0.3 - 0.9 mg/dL    Comment: Performed at [facility], [place] [address] [place], [place] [address]  Protime-INR     Status: Abnormal   Collection Time: 02/03/19  2:35 PM  Result Value Ref Range   Prothrombin Time 28.6 (H) 11.4 - 15.2 seconds   INR 2.7 (H) 0.8 - 1.2    Comment: (NOTE) INR goal varies based on device and disease states. Performed at [facility], [place] [address], [place], [place] [address]   Troponin I - Add-On to previous collection     Status: None   Collection Time: 02/03/19  2:35 PM  Result Value Ref Range   Troponin I <0.03 <0.03 ng/mL    Comment: Performed at [facility], [place] [address], [place], [place] [address]  SARS Coronavirus 2 [facility] order, Performed in [facility] hospital lab)     Status: None   Collection Time: 02/03/19  3:27 PM  Result Value Ref Range   SARS Coronavirus 2 NEGATIVE NEGATIVE    Comment: (NOTE) If result is NEGATIVE SARS-CoV-2 target nucleic acids are NOT DETECTED. The SARS-CoV-2 RNA is generally detectable in upper and lower  respiratory specimens during the acute phase of infection. The lowest  concentration of SARS-CoV-2 viral copies this assay can detect is 250  copies / mL. A negative result does not preclude SARS-CoV-2 infection  and should not be used as the sole basis for treatment or other  patient management decisions.  A negative result may occur with  improper specimen collection / handling, submission of specimen other  than nasopharyngeal swab, presence of viral mutation(s) within the  areas targeted by this assay, and inadequate number of viral copies  (<250 copies / mL). A negative result must be combined with clinical  observations, patient history, and epidemiological information. If result is POSITIVE SARS-CoV-2 target nucleic acids are DETECTED. The SARS-CoV-2 RNA is generally detectable in upper and lower  respiratory specimens dur ing the acute phase of infection.  Positive  results are indicative of active infection with SARS-CoV-2.  Clinical  correlation with patient history and other diagnostic information is  necessary to determine patient infection status.  Positive results do  not rule out bacterial infection or co-infection with other viruses. If result is PRESUMPTIVE POSTIVE SARS-CoV-2 nucleic acids MAY BE PRESENT.   A presumptive positive result was obtained on the submitted specimen  and confirmed on repeat testing.  While 2019 novel coronavirus  (SARS-CoV-2) nucleic acids may be present in the submitted sample  additional confirmatory testing may be necessary for  epidemiological  and / or clinical management purposes  to differentiate between  SARS-CoV-2 and other Sarbecovirus currently known to infect humans.  If clinically indicated additional testing with an alternate test  methodology [phone]) is advised. The SARS-CoV-2 RNA is generally  detectable in upper and lower respiratory  sp ecimens during the acute  phase of infection. The expected result is Negative. Fact Sheet for Patients:  StrictlyIdeas.no Fact Sheet for Healthcare Providers: BankingDealers.co.za This test is not yet approved or cleared by the [place] FDA and has been authorized for detection and/or diagnosis of SARS-CoV-2 by FDA under an Emergency Use Authorization (EUA).  This EUA will remain in effect (meaning this test can be used) for the duration of the COVID-19 declaration under Section 564(b)(1) of the Act, 21 U.S.C. section 360bbb-3(b)(1), unless the authorization is terminated or revoked sooner. Performed at [facility], [place] [address] [place], [place] [address]   Magnesium     Status: None   Collection Time: 02/03/19  3:30 PM  Result Value Ref Range   Magnesium 1.7 1.7 - 2.4 mg/dL    Comment: Performed at [facility], [place] [address], [place], [place] [address]  Urinalysis, Routine w reflex microscopic     Status: Abnormal   Collection Time: 02/03/19  6:59 PM  Result Value Ref Range   Color, Urine YELLOW YELLOW   APPearance HAZY (A) CLEAR   Specific Gravity, Urine 1.008 1.005 - 1.030   pH 5.0 5.0 - 8.0   Glucose, UA NEGATIVE NEGATIVE mg/dL   Hgb urine dipstick SMALL (A) NEGATIVE   Bilirubin Urine NEGATIVE NEGATIVE   Ketones, ur NEGATIVE NEGATIVE mg/dL   Protein, ur 100 (A) NEGATIVE mg/dL   Nitrite NEGATIVE NEGATIVE   Leukocytes,Ua LARGE (A) NEGATIVE   RBC / HPF 6-10 0 - 5 RBC/hpf   WBC, UA >50 (H) 0 - 5 WBC/hpf   Bacteria, UA RARE (A) NONE SEEN   Squamous  Epithelial / LPF 0-5 0 - 5   WBC Clumps PRESENT    Mucus PRESENT     Comment: Performed at [facility], [place] [address], [place], [place] [address]  Glucose, capillary     Status: Abnormal   Collection Time: 02/03/19  8:38 PM  Result Value Ref Range   Glucose-Capillary 68 (L) 70 - 99 mg/dL   Comment 1 Notify RN    Comment 2 Document in Chart   Troponin I - Now Then Q6H     Status: None   Collection Time: 02/03/19  8:40 PM  Result Value Ref Range   Troponin I <0.03 <0.03 ng/mL    Comment: Performed at [facility] [place] [address] [place], [place] [address]  TSH     Status: None   Collection Time: 02/03/19  8:40 PM  Result Value Ref Range   TSH 1.287 0.350 - 4.500 uIU/mL    Comment: Performed by a 3rd Generation assay with a functional sensitivity of <=0.01 uIU/mL. Performed at [facility], [place] [address], [place], [place] [address]   Glucose, capillary     Status: Abnormal   Collection Time: 02/03/19  9:22 PM  Result Value Ref Range   Glucose-Capillary 161 (H) 70 - 99 mg/dL  Troponin I - Now Then Q6H     Status: None   Collection Time: 02/04/19  2:20 AM  Result Value Ref Range   Troponin I <0.03 <0.03 ng/mL    Comment: Performed at [facility], [place] [address] [place], [place] [address]  Hemoglobin A1c     Status: Abnormal   Collection Time: 02/04/19  2:20 AM  Result Value Ref Range   Hgb A1c MFr Bld 8.3 (H) 4.8 - 5.6 %    Comment: (NOTE) Pre diabetes:  5.7%-6.4% Diabetes:              >6.4% Glycemic control for   <7.0% adults with diabetes    Mean Plasma Glucose 191.51 mg/dL    Comment: Performed at [facility], [place] [address] [place], [place] [address]  Magnesium     Status: None   Collection Time: 02/04/19  2:20 AM  Result Value Ref Range   Magnesium 2.0 1.7 - 2.4 mg/dL    Comment: Performed at [facility], [place] [address] [place], [place] [address]   Basic metabolic panel     Status: Abnormal   Collection Time: 02/04/19  2:20 AM  Result Value Ref Range   Sodium 142 135 - 145 mmol/L   Potassium 4.2 3.5 - 5.1 mmol/L   Chloride 105 98 - 111 mmol/L   CO2 29 22 - 32 mmol/L   Glucose, Bld 179 (H) 70 - 99 mg/dL   BUN 30 (H) 8 - 23 mg/dL   Creatinine, Ser 2.41 (H) 0.61 - 1.24 mg/dL   Calcium 8.2 (L) 8.9 - 10.3 mg/dL   GFR calc non Af Amer 27 (L) >60 mL/min   GFR calc Af Amer 31 (L) >60 mL/min   Anion gap 8 5 - 15    Comment: Performed at [facility], [place] [address] [place], [place] [address]  Lipid panel     Status: Abnormal   Collection Time: 02/04/19  2:20 AM  Result Value Ref Range   Cholesterol 84 0 - 200 mg/dL   Triglycerides 47 <150 mg/dL   HDL 26 (L) >40 mg/dL   Total CHOL/HDL Ratio 3.2 RATIO   VLDL 9 0 - 40 mg/dL   LDL Cholesterol 49 0 - 99 mg/dL    Comment:        Total Cholesterol/HDL:CHD Risk Coronary Heart Disease Risk Table                     Men   Women  1/2 Average Risk   3.4   3.3  Average Risk       5.0   4.4  2 X Average Risk   9.6   7.1  3 X Average Risk  23.4   11.0        Use the calculated Patient Ratio above and the CHD Risk Table to determine the patient's CHD Risk.        ATP III CLASSIFICATION (LDL):  <100     mg/dL   Optimal  100-129  mg/dL   Near or Above                    Optimal  130-159  mg/dL   Borderline  160-189  mg/dL   High  >190     mg/dL   Very High Performed at [place] [address], [place], [place] [address]   Protime-INR     Status: Abnormal   Collection Time: 02/04/19  2:20 AM  Result Value Ref Range   Prothrombin Time 28.8 (H) 11.4 - 15.2 seconds   INR 2.8 (H) 0.8 - 1.2    Comment: (NOTE) INR goal varies based on device and disease states. Performed at [facility], [place] [address], [place], [place] [address]   Glucose, capillary     Status: Abnormal   Collection Time: 02/04/19  8:04 AM  Result Value  Ref Range   Glucose-Capillary 162 (H) 70 -  99 mg/dL  MRSA PCR Screening     Status: Abnormal   Collection Time: 02/04/19  9:38 AM  Result Value Ref Range   MRSA by PCR POSITIVE (A) NEGATIVE    Comment:        The GeneXpert MRSA Assay (FDA approved for NASAL specimens only), is one component of a comprehensive MRSA colonization surveillance program. It is not intended to diagnose MRSA infection nor to guide or monitor treatment for MRSA infections. RESULT CALLED TO, READ BACK BY AND VERIFIED WITH: Tyler Aas [id number] @ [address] Performed at [place] [address] [place], [place] [address]   Troponin I - Now Then Q6H     Status: None   Collection Time: 02/04/19  9:56 AM  Result Value Ref Range   Troponin I <0.03 <0.03 ng/mL    Comment: Performed at [facility], [place] [address] [place], [place] [address]  Glucose, capillary     Status: Abnormal   Collection Time: 02/04/19 11:46 AM  Result Value Ref Range   Glucose-Capillary 215 (H) 70 - 99 mg/dL  Glucose, capillary     Status: Abnormal   Collection Time: 02/04/19  4:45 PM  Result Value Ref Range   Glucose-Capillary 184 (H) 70 - 99 mg/dL  Glucose, capillary     Status: Abnormal   Collection Time: 02/04/19 10:18 PM  Result Value Ref Range   Glucose-Capillary 141 (H) 70 - 99 mg/dL  Basic metabolic panel     Status: Abnormal   Collection Time: 02/05/19  5:02 AM  Result Value Ref Range   Sodium 143 135 - 145 mmol/L   Potassium 3.9 3.5 - 5.1 mmol/L   Chloride 102 98 - 111 mmol/L   CO2 30 22 - 32 mmol/L   Glucose, Bld 140 (H) 70 - 99 mg/dL   BUN 33 (H) 8 - 23 mg/dL   Creatinine, Ser 2.49 (H) 0.61 - 1.24 mg/dL   Calcium 8.4 (L) 8.9 - 10.3 mg/dL   GFR calc non Af Amer 26 (L) >60 mL/min   GFR calc Af Amer 30 (L) >60 mL/min   Anion gap 11 5 - 15    Comment: Performed at [facility], [place] [address], [place], [place] [address]  Protime-INR     Status:  Abnormal   Collection Time: 02/05/19  5:02 AM  Result Value Ref Range   Prothrombin Time 27.3 (H) 11.4 - 15.2 seconds   INR 2.6 (H) 0.8 - 1.2    Comment: (NOTE) INR goal varies based on device and disease states. Performed at [facility], [place] [address] [place], [place] [address]   Magnesium     Status: None   Collection Time: 02/05/19  5:02 AM  Result Value Ref Range   Magnesium 1.9 1.7 - 2.4 mg/dL    Comment: Performed at [facility], [place] [address], [place], [place] [address]  Glucose, capillary     Status: Abnormal   Collection Time: 02/05/19  6:18 AM  Result Value Ref Range   Glucose-Capillary 133 (H) 70 - 99 mg/dL  Glucose, capillary     Status: Abnormal   Collection Time: 02/05/19  7:52 AM  Result Value Ref Range   Glucose-Capillary 130 (H) 70 - 99 mg/dL   Ct Abdomen Pelvis Wo Contrast  Result Date: 02/05/2019 CLINICAL DATA:  Persistent nausea and vomiting. Concern for bowel obstruction EXAM: CT ABDOMEN AND PELVIS WITHOUT CONTRAST TECHNIQUE: Multidetector CT  imaging of the abdomen and pelvis was performed following the standard protocol without IV contrast. COMPARISON:  11/10/2017 FINDINGS: Lower chest: Streaky opacities in the lower lobes. Small pleural effusions. Cardiomegaly with biventricular pacer. Hepatobiliary: No focal liver abnormality. Apparent low-density in the posterior right liver on reformats is most likely from motion based on other source images.Cholecystectomy. Small volume portal venous gas seen in the left liver. Pancreas: Atrophy. Spleen: Unremarkable. Adrenals/Urinary Tract: 16 mm right adrenal adenoma. No hydronephrosis or stone. Unremarkable bladder. Stomach/Bowel: Generalized gaseous distention of colon which also contains stool. No volvulus or rectal impaction. Proximal small bowel loops are gas distended and there are some decompressed small bowel loops in the right upper quadrant, but no discrete transition point and  gas is intermittently seen and more distal small bowel period. The stomach is moderately distended and there is fairly extensive gastric wall pneumatosis. Small volume fluid/inflammation is seen around the fundus of the stomach. Vascular/Lymphatic: No acute vascular abnormality. Atherosclerotic calcification. Reproductive:Negative Other: No pneumoperitoneum. Musculoskeletal: Advanced spondylosis with multi-level ankylosis. These results were called by telephone at the time of interpretation on 02/05/2019 at 8:42 am to Dr. Grandville Silos , who verbally acknowledged these results. Please see Z vision dashboard for details of communication. IMPRESSION: 1. Moderately distended stomach with gastric pneumatosis and small volume portal venous gas. This is presumably related patient's history of repetitive vomiting, but please correlate with exam and lactate to exclude necrosis. There is some regional inflammation without pneumoperitoneum. 2. Gas distended proximal small bowel loops without discrete transition point. There has been prior proximal small bowel surgery. 3. Generalized distention of the colon with ileus pattern. Electronically Signed   By: Monte Fantasia M.D.   On: 02/05/2019 08:42   Dg Chest 2 View  Result Date: 02/03/2019 CLINICAL DATA:  Shortness of breath.  Hypoxia. EXAM: CHEST - 2 VIEW COMPARISON:  Chest x-[place] dated 02/13/2018 and 01/26/2016 FINDINGS: Chronic cardiomegaly pacemaker in place. Slight new pulmonary vascular prominence. On the lateral view there is suggestion of posteriorly layered pleural effusions. No acute bone abnormality. IMPRESSION: 1. Chronic cardiomegaly. 2. New slight pulmonary vascular prominence with probable posterior pleural effusions. Electronically Signed   By: Lorriane Shire M.D.   On: 02/03/2019 15:36   Dg Chest Port 1 View  Result Date: 02/05/2019 CLINICAL DATA:  Vomiting and lethargy EXAM: PORTABLE CHEST 1 VIEW COMPARISON:  02/03/2019 FINDINGS: Chronic cardiomegaly and  vascular pedicle widening. There is biventricular pacer leads from the left. Vascular congestion. No Kerley lines, effusion, or pneumothorax. No asymmetric airspace opacity IMPRESSION: Cardiomegaly and vascular congestion similar to recent study. Electronically Signed   By: Monte Fantasia M.D.   On: 02/05/2019 06:33   Dg Abd Portable 1v  Result Date: 02/05/2019 CLINICAL DATA:  Vomiting EXAM: PORTABLE ABDOMEN - 1 VIEW COMPARISON:  Portable exam 2312 hours compared to CT abdomen and pelvis 11/10/2017 FINDINGS: Pacer leads project over RIGHT atrium and RIGHT ventricle. Air-filled loops of bowel throughout abdomen, likely: An upper normal small bowel loops. Gas present to at least distal sigmoid colon. No definite bowel wall thickening or evidence of obstruction. Bones demineralized. IMPRESSION: Air-filled loops of large and small bowel without definite evidence of obstruction. Electronically Signed   By: Lavonia Dana M.D.   On: 02/05/2019 00:00   Dg Foot Complete Left  Result Date: 02/03/2019 CLINICAL DATA:  Chronic heel wound. EXAM: LEFT FOOT - COMPLETE 3+ VIEW COMPARISON:  02/13/2018 FINDINGS: There is diffuse osteopenia. Slight chronic arthritic changes of the first MTP joint. Chronic  arthritic changes of the ankle joint. Soft tissue swelling of the foot primarily on the dorsum of the forefoot. Slight dorsal spurring in the midfoot. No evidence of osteomyelitis. Chronic calcifications in the distal Achilles tendon. IMPRESSION: 1. No acute bone abnormalities. 2. No discrete osteomyelitis. 3. New soft tissue swelling of the foot. Electronically Signed   By: Lorriane Shire M.D.   On: 02/03/2019 15:38   . sodium chloride    . 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 gross is reviewed the CT and examined the patient.  At this point we would recommend NG tube for gastric decompression, keep him n.p.o.  Add PPI, and IV antibiotics.  He would be a terrible surgical candidate with his extensive history of multiple hernia repairs and prior EC fistula.  We will follow with you.    Earnstine Regal [facility] Surgery 02/05/2019, 9:17 AM Pager: [phone] Consults: [phone]

## 2019-02-05 NOTE — Progress Notes (Signed)
Patient is refusing to have NG tube inserted.

## 2019-02-05 NOTE — Progress Notes (Signed)
Progress Note  Patient Name: Kurt Cross Date of Encounter: 02/05/2019  Primary Cardiologist: Ena Dawley, MD  Subjective   Sleeping in bed, awakens to voice. No complaints this AM to me.  Inpatient Medications    Scheduled Meds:  Chlorhexidine Gluconate Cloth  6 each Topical Q0600   chlorpheniramine-HYDROcodone  5 mL Oral Once   furosemide  80 mg Intravenous Q12H   gabapentin  300 mg Oral TID   insulin aspart  0-5 Units Subcutaneous Q4H   mouth rinse  15 mL Mouth Rinse BID   metolazone  2.5 mg Oral Daily   metoprolol tartrate  5 mg Intravenous Q8H   mupirocin ointment  1 application Nasal BID   pantoprazole (PROTONIX) IV  40 mg Intravenous Q24H   potassium chloride SA  20 mEq Oral BID   sodium chloride flush  3 mL Intravenous Q12H   spironolactone  25 mg Oral BID   warfarin  7.5 mg Oral ONCE-1800   Warfarin - Pharmacist Dosing Inpatient   Does not apply q1800   Continuous Infusions:  sodium chloride     PRN Meds: sodium chloride, acetaminophen, hydrocerin, ondansetron (ZOFRAN) IV, sodium chloride flush, traZODone   Vital Signs    Vitals:   02/04/19 2153 02/05/19 0147 02/05/19 0614 02/05/19 0650  BP: (!) 157/82  135/83 140/81  Pulse: 70 72 70   Resp: 18  20   Temp: 98.6 F (37 C)     TempSrc: Oral     SpO2: 92% 95% 90% 94%  Weight:      Height:        Intake/Output Summary (Last 24 hours) at 02/05/2019 0945 Last data filed at 02/05/2019 0600 Gross per 24 hour  Intake 123 ml  Output 2305 ml  Net -2182 ml   Last 3 Weights 02/03/2019 02/03/2019 08/25/2018  Weight (lbs) 313 lb 0.9 oz 289 lb (No Data)  Weight (kg) 142 kg 131.09 kg (No Data)      Telemetry    Atrial flutter with V pacing - Personally Reviewed  ECG    No new - Personally Reviewed  Physical Exam   GEN: No acute distress.   Neck: JVD difficult to assess due to body habitus, appears to be elevated slightly above clavicle at 60 degrees Cardiac: RRR, no murmurs, rubs,  or gallops.  Respiratory: Clear to auscultation bilaterally. GI: Soft, nontender, non-distended  MS: 1+ bilateral LE edema at shins, 2+ at ankles; LE wound without apparent drainage Neuro:  Nonfocal  Psych: sleepy, does awaken to voice but minimally conversational  Labs    Chemistry Recent Labs  Lab 02/03/19 1435 02/04/19 0220 02/05/19 0502  NA 140 142 143  K 4.3 4.2 3.9  CL 105 105 102  CO2 28 29 30   GLUCOSE 263* 179* 140*  BUN 31* 30* 33*  CREATININE 2.33* 2.41* 2.49*  CALCIUM 8.3* 8.2* 8.4*  PROT 6.4*  --   --   ALBUMIN 2.7*  --   --   AST 12*  --   --   ALT 10  --   --   ALKPHOS 33*  --   --   BILITOT 0.8  --   --   GFRNONAA 28* 27* 26*  GFRAA 33* 31* 30*  ANIONGAP 7 8 11      Hematology Recent Labs  Lab 02/03/19 1435  WBC 8.5  RBC 4.29  HGB 13.6  HCT 43.8  MCV 102.1*  MCH 31.7  MCHC 31.1  RDW 17.2*  PLT 210    Cardiac Enzymes Recent Labs  Lab 02/03/19 1435 02/03/19 2040 02/04/19 0220 02/04/19 0956  TROPONINI <0.03 <0.03 <0.03 <0.03   No results for input(s): TROPIPOC in the last 168 hours.   BNP Recent Labs  Lab 02/03/19 1435  BNP 194.2*     DDimer No results for input(s): DDIMER in the last 168 hours.   Radiology    Ct Abdomen Pelvis Wo Contrast  Result Date: 02/05/2019 CLINICAL DATA:  Persistent nausea and vomiting. Concern for bowel obstruction EXAM: CT ABDOMEN AND PELVIS WITHOUT CONTRAST TECHNIQUE: Multidetector CT imaging of the abdomen and pelvis was performed following the standard protocol without IV contrast. COMPARISON:  11/10/2017 FINDINGS: Lower chest: Streaky opacities in the lower lobes. Small pleural effusions. Cardiomegaly with biventricular pacer. Hepatobiliary: No focal liver abnormality. Apparent low-density in the posterior right liver on reformats is most likely from motion based on other source images.Cholecystectomy. Small volume portal venous gas seen in the left liver. Pancreas: Atrophy. Spleen: Unremarkable.  Adrenals/Urinary Tract: 16 mm right adrenal adenoma. No hydronephrosis or stone. Unremarkable bladder. Stomach/Bowel: Generalized gaseous distention of colon which also contains stool. No volvulus or rectal impaction. Proximal small bowel loops are gas distended and there are some decompressed small bowel loops in the right upper quadrant, but no discrete transition point and gas is intermittently seen and more distal small bowel period. The stomach is moderately distended and there is fairly extensive gastric wall pneumatosis. Small volume fluid/inflammation is seen around the fundus of the stomach. Vascular/Lymphatic: No acute vascular abnormality. Atherosclerotic calcification. Reproductive:Negative Other: No pneumoperitoneum. Musculoskeletal: Advanced spondylosis with multi-level ankylosis. These results were called by telephone at the time of interpretation on 02/05/2019 at 8:42 am to Dr. Grandville Silos , who verbally acknowledged these results. Please see Z vision dashboard for details of communication. IMPRESSION: 1. Moderately distended stomach with gastric pneumatosis and small volume portal venous gas. This is presumably related patient's history of repetitive vomiting, but please correlate with exam and lactate to exclude necrosis. There is some regional inflammation without pneumoperitoneum. 2. Gas distended proximal small bowel loops without discrete transition point. There has been prior proximal small bowel surgery. 3. Generalized distention of the colon with ileus pattern. Electronically Signed   By: Monte Fantasia M.D.   On: 02/05/2019 08:42   Dg Chest 2 View  Result Date: 02/03/2019 CLINICAL DATA:  Shortness of breath.  Hypoxia. EXAM: CHEST - 2 VIEW COMPARISON:  Chest x-[place] dated 02/13/2018 and 01/26/2016 FINDINGS: Chronic cardiomegaly pacemaker in place. Slight new pulmonary vascular prominence. On the lateral view there is suggestion of posteriorly layered pleural effusions. No acute bone  abnormality. IMPRESSION: 1. Chronic cardiomegaly. 2. New slight pulmonary vascular prominence with probable posterior pleural effusions. Electronically Signed   By: Lorriane Shire M.D.   On: 02/03/2019 15:36   Dg Chest Port 1 View  Result Date: 02/05/2019 CLINICAL DATA:  Vomiting and lethargy EXAM: PORTABLE CHEST 1 VIEW COMPARISON:  02/03/2019 FINDINGS: Chronic cardiomegaly and vascular pedicle widening. There is biventricular pacer leads from the left. Vascular congestion. No Kerley lines, effusion, or pneumothorax. No asymmetric airspace opacity IMPRESSION: Cardiomegaly and vascular congestion similar to recent study. Electronically Signed   By: Monte Fantasia M.D.   On: 02/05/2019 06:33   Dg Abd Portable 1v  Result Date: 02/05/2019 CLINICAL DATA:  Vomiting EXAM: PORTABLE ABDOMEN - 1 VIEW COMPARISON:  Portable exam 2312 hours compared to CT abdomen and pelvis 11/10/2017 FINDINGS: Pacer leads project over RIGHT atrium and RIGHT ventricle.  Air-filled loops of bowel throughout abdomen, likely: An upper normal small bowel loops. Gas present to at least distal sigmoid colon. No definite bowel wall thickening or evidence of obstruction. Bones demineralized. IMPRESSION: Air-filled loops of large and small bowel without definite evidence of obstruction. Electronically Signed   By: Lavonia Dana M.D.   On: 02/05/2019 00:00   Dg Foot Complete Left  Result Date: 02/03/2019 CLINICAL DATA:  Chronic heel wound. EXAM: LEFT FOOT - COMPLETE 3+ VIEW COMPARISON:  02/13/2018 FINDINGS: There is diffuse osteopenia. Slight chronic arthritic changes of the first MTP joint. Chronic arthritic changes of the ankle joint. Soft tissue swelling of the foot primarily on the dorsum of the forefoot. Slight dorsal spurring in the midfoot. No evidence of osteomyelitis. Chronic calcifications in the distal Achilles tendon. IMPRESSION: 1. No acute bone abnormalities. 2. No discrete osteomyelitis. 3. New soft tissue swelling of the foot.  Electronically Signed   By: Lorriane Shire M.D.   On: 02/03/2019 15:38    Cardiac Studies   Echo personally reviewed, from 02/04/19:  1. The left ventricle has moderately reduced systolic function, with an ejection fraction of 35-40%. The cavity size was normal. There is moderate concentric left ventricular hypertrophy. Left ventricular diastolic Doppler parameters are consistent with pseudonormalization.  2. The right ventricle was not assessed. The cavity was mildly enlarged. Right ventricular systolic pressure is moderately elevated with an estimated pressure of 44.6 mmHg.  3. The mitral valve is grossly normal.  4. The tricuspid valve is grossly normal. Tricuspid valve regurgitation is mild-moderate.  5. Mild thickening of the aortic valve. Aortic valve regurgitation is mild by color flow Doppler. No stenosis of the aortic valve.  6. There is moderate dilatation of the ascending aorta and of the aortic root.  7. Pulmonary hypertension is moderate.  8. The inferior vena cava was dilated in size with <50% respiratory variability.   Patient Profile     68 y.o. male with PMH non-obstructive CAD, permanent atrial fibrillation on coumadin, complete heart block s/p PPM with upgrade to CRT-P 6387, chronic systolic and diastolic heart failure, OSA, HTN, HLD, type II diabetes, CKD stage 3 bordering on 4 who presented with acute on chronic systolic heart failure, likely in the setting of medication nonadherence. We are following at the request of Dr. Grandville Silos for management of heart failure.  Assessment & Plan    Acute on chronic systolic and diastolic heart failure: SOB, orthopnea, weight gain, hypoxia in the setting of at least 1 month per patient of not using lasix at home -echo done yesterday with EF 35-40%, similar to prior, with mildly elevated filling pressures and grade 2 DD -Admission weight 142 kg, no updated weights (though on same day, has a weight charted as 131 kg). Need accurate daily  weights to track progress. Reports home weight is around 270 lbs. -I/O net negative 2.7 L -Cr 2.49 today (from 2.33 on admission) -reinforced importance of med adherence -reinforced heart failure education, including daily weights, salt avoidance, fluid restriction, activity recommendations. Plan is for him to go home per primary team, and he will need home assistance with this. -Was on lasix 80 mg daily, metolazone 2.5 mg daily, and spironolactone 25 mg BID at home. -would like to change to oral diuretics today, but he is strict NPO as much as possible per team. Will decrease to daily dosing given rise in creatinine. Still has some volume on, but improved, and with rising creatinine do not want to overdiurese -renal function  limits ACEi/ARB/ARNI use. If his creatinine continues to rise chronically, may also need to cut back on spironolactone.  -on carvedilol 25 mg BID, but was changed this AM to IV metoprolol. This is not equivalent from a heart failure perspective, so once he is off strict NPO would change back to oral dosing -his renal function is borderline, but with his diabetes and heart failure, could consider SGLT2 inhibitor. Will defer to endo/medicine on this, but my concern would be his renal function and the amount of diuretics he is on concomitantly would complicate use of this. -I do not think he will be able to do hydralazine/isordil TID post discharge. Per Dr. Francesca Oman note in 2017, he had issues with this in the past. I would like to start him at a low dose in the hospital, and if he goes to a facility, he may be able to do this dosing short term, but at home I suspect best we can hope for is BID. Would benefit from med assistance at home. Cannot start today due to strict NPO, but would start isordil 5-hydralazine 10 mg TID and uptitrate as able. -would set up for CPAP as this may exacerbate his HF, given reported sleep apnea  Afib, pacemaker dependent with CRT-P: continue coumadin,  followed by Dr. Rayann Heman.  HLD: continue statin HTN: adding isordil/hydralazine as above type II diabetes per primary team.  TAA history 2014: Cannot have repeat CTA for TAA given his currently kidney function. On echo, measured at 4.6 cm. Defer routine follow up for TAA to PCP as outpatient as he is not currently symptomatic.  TIME SPENT WITH PATIENT: 25 minutes of direct patient care. More than 50% of that time was spent on coordination of care and counseling regarding HF management.  Buford Dresser, MD, PhD [facility]   For questions or updates, please contact [place] Please consult [email or web address] for contact info under     Signed, Buford Dresser, MD  02/05/2019, 9:45 AM

## 2019-02-05 NOTE — Progress Notes (Signed)
Pharmacy Antibiotic Note  LENORD FRALIX is a 68 y.o. male admitted on 02/03/2019 with IAI.  Pharmacy has been consulted for Zosyn dosing.  Plan: Zosyn 3.375g IV q8 (extended interval infusion)   Height: 5\' 11"  (180.3 cm) Weight: (!) 313 lb 0.9 oz (142 kg) IBW/kg (Calculated) : 75.3  Temp (24hrs), Avg:98.5 F (36.9 C), Min:98.3 F (36.8 C), Max:98.6 F (37 C)  Recent Labs  Lab 02/03/19 1435 02/04/19 0220 02/05/19 0502  WBC 8.5  --   --   CREATININE 2.33* 2.41* 2.49*    Estimated Creatinine Clearance: 42.7 mL/min (A) (by C-G formula based on SCr of 2.49 mg/dL (H)).    No Known Allergies   Thank you for allowing pharmacy to be a part of this patient's care.  Kara Mead 02/05/2019 9:49 AM

## 2019-02-05 NOTE — Progress Notes (Signed)
PROGRESS NOTE    Kurt Cross  VPX:[id number] DOB: 1950/12/01 DOA: 02/03/2019 PCP: Kurt Maw, MD   Brief Narrative:  Kurt Cross is a 68 y.o. male with medical history significant of chronic systolic heart failure, A. fib/a flutter, AAA, chronic kidney disease stage III, complete heart block status post PPM, chronic constipation, type 2 diabetes, chronic heel wound who presents to the ED with a one-week history of worsening shortness of breath and worsening lower extremity edema.  Patient states lower extremity edema has been ongoing for greater than 1 week.  Patient also endorses a 8 to 10 pound weight gain over the past 2 weeks.  Patient denies any fevers, no chest pain, no nausea, no cough, no melena, no hematemesis, no hematochezia.  Patient does endorse a bout of emesis 1 week prior to admission.  Patient denies any diarrhea, no constipation.  Patient does endorse some soft stools.  Patient denies any abdominal pain.  No syncopal episodes, no dietary indiscretions.  Patient does endorse some dysuria.  Patient is noted in his chart has a history of medical noncompliance patient states has missed 1 weeks worth of his Crestor and furosemide stating he ran out of his medications.  Patient also endorses decreased oral intake and some insomnia.  Patient stated that his wife made him come to the ED.  ED Course: Patient seen in the ED, comprehensive metabolic profile with a glucose of 263, BUN of 31, creatinine of 2.33, albumin of 2.7 otherwise was within normal limits.  BNP of 194.2.  Troponin was negative.  CBC unremarkable.  INR of 2.7.  COVID-19 was negative.  Chest x-[place] done with chronic cardiomegaly, new slight pulmonary vascular prominence with probable posterior pleural effusions.  Plain films of the left foot negative for any acute abnormalities.  EKG done with a paced rhythm.  Patient given a dose of Lasix 80 mg IV x1.  Hospitalist were called to admit the patient for further  evaluation and management.    Assessment & Plan:   Principal Problem:   Acute exacerbation of CHF (congestive heart failure) (HCC) Active Problems:   Pneumatosis of intestines   Nausea & vomiting   Diabetes mellitus, insulin dependent (IDDM), uncontrolled ([place])   Hyperlipidemia   Obesity, Class III, BMI 40-49.9 (morbid obesity) ([place])   Obstructive sleep apnea   Essential hypertension   Chronic combined systolic and diastolic CHF (congestive heart failure) (HCC)   AAA (abdominal aortic aneurysm)/ 4.5 cm ascending per CT angio 08/01/13   Atrial fibrillation/flutter   Debility   S/P placement of cardiac pacemaker   Chronic anticoagulation   CKD (chronic kidney disease), stage III (HCC)   Diabetic ulcer of ankle associated with type 2 diabetes mellitus, limited to breakdown of skin ([place])  1. acute on chronic systolic heart failure exacerbation Patient presented with shortness of breath x1 week, worsening lower extremity edema greater than a week, 8 to 10 pound weight gain over the past 2 weeks.  Patient afebrile.  No productive cough.  Questionable etiology.  May be secondary to medical noncompliance as patient does state he ran out of his furosemide and Crestor for greater than a week.  Per pharmacy tech patient noted to have refills of his furosemide with his medications.    Patient with a urine output of 1.825 L over the past 24 hours.  Cardiac enzymes negative x3.  TSH of 1.287 on 02/03/2019.    COVID-19 tested in the ED was negative. EKG is ventricularly paced.  2D echo with a EF of 35 to 40%, moderate left ventricular hypertrophy, moderate pulmonary hypertension.  Continue Lasix 80 mg IV every 12 hours, Zaroxolyn, spironolactone.  Patient currently n.p.o. due to nausea and emesis and as such we will change Coreg to IV Lopressor for now.  Cardiology following and appreciate input and recommendations.  2.  Nausea and vomiting/gastric pneumatosis/colonic ileus Patient noted to have  significant nausea and vomiting last night with abdominal distention and tightness and also noted to have a large bowel movement.  Patient with no further emesis this morning and somewhat drowsy. CT abdomen and pelvis which was done with moderately distended stomach with gastric pneumatosis and small volume portal venous gas.  Gas distended proximal small bowel loops without discrete transition point, prior proximal small bowel surgery.  Generalized distention of colon with ileus pattern.  Will make patient n.p.o.  We will hold off on NG tube at this time as patient with no ongoing nausea and vomiting.  Check a lactic acid level to exclude necrosis.  Will consult with general surgery for further evaluation and management.  3.  Diabetes mellitus type 2 Hemoglobin A1c of 8.3.  Patient noted to have a blood glucose level of 68 at 2038hRS (02/04/2019) evening.  CBG of 130 this morning.  Patient noted to have nausea and emesis overnight with some abnormal findings on CT abdomen and pelvis.  Will make patient n.p.o. for now.  Discontinue NPH for now.  Continue sliding scale insulin.  Follow.  4.  Chronic A. fib/a flutter/status post PPM. Patient with nausea and emesis early this morning with abdominal distention.  Will make patient n.p.o. due to nausea and vomiting and CT abdomen and pelvis findings.  DC Coreg.  Placed on scheduled IV Lopressor for now.  INR therapeutic at 2.6.  Potassium at 3.9.  Magnesium at 1.9. Coumadin per pharmacy.     5.  Hyperlipidemia Fasting lipid panel with LDL of 49.  Patient with nausea and vomiting and as such we will discontinue statin for now as we will make patient n.p.o.   6.  Chronic left heel wound Seems to be healing.  No drainage noted.  Wound care.  7.  Chronic kidney disease stage III Seems to be at baseline.    Follow closely with diuresis. Follow.  8.  Obesity  9.  Probable OSA Will need formal outpatient sleep study.    DVT prophylaxis:  Coumadin Code Status: DNR Family Communication: Updated patient.  Updated wife Kurt Cross on telephone. Disposition Plan: Likely back home with home health once acute CHF exacerbation is resolved and patient is euvolemic and when okay with cardiology.   Consultants:   Cardiology: Dr. Harrell Gave 02/04/2019  Procedures:   2D echo 02/04/2019  CT abdomen and pelvis 02/05/2019  Chest x-[place] 02/05/2019    Antimicrobials:   None   Subjective: Patient drowsy.  Per RN patient received a milligram of Ativan this morning.  Events overnight noted with nausea vomiting with increased O2 requirements currently on 6 L nasal cannula.  Was also noted overnight that patient had some significant abdominal distention and tightness.  Patient drowsy arousable however drifts back to sleep and shakes head in terms of shortness of breath and chest pain which he somewhat denies.    Objective: Vitals:   02/04/19 2153 02/05/19 0147 02/05/19 0614 02/05/19 0650  BP: (!) 157/82  135/83 140/81  Pulse: 70 72 70   Resp: 18  20   Temp: 98.6 F (37 C)  TempSrc: Oral     SpO2: 92% 95% 90% 94%  Weight:      Height:        Intake/Output Summary (Last 24 hours) at 02/05/2019 0936 Last data filed at 02/05/2019 0600 Gross per 24 hour  Intake 123 ml  Output 2305 ml  Net -2182 ml   Filed Weights   02/03/19 1431 02/03/19 1907  Weight: 131.1 kg (!) 142 kg    Examination:  General exam: Appears calm and comfortable  Respiratory system: Bibasilar crackles.  Some coarse breath sounds anterior lung fields.  No wheezing.  Respiratory effort normal. Cardiovascular system: Regular rate rhythm no murmurs rubs or gallops.  1-2+ bilateral lower extremity edema left greater than right.  Gastrointestinal system: Abdomen is distended.  Hypoactive bowel sounds, soft, nontender to palpation. Central nervous system: Drowsy.  Moving extremities spontaneously.  Extremities: Symmetric 5 x 5 power. Skin: No rashes, lesions or  ulcers Psychiatry: Unable to assess judgment and insight as patient currently drowsy.      Data Reviewed: I have personally reviewed following labs and imaging studies  CBC: Recent Labs  Lab 02/03/19 1435  WBC 8.5  HGB 13.6  HCT 43.8  MCV 102.1*  PLT 027   Basic Metabolic Panel: Recent Labs  Lab 02/03/19 1435 02/03/19 1530 02/04/19 0220 02/05/19 0502  NA 140  --  142 143  K 4.3  --  4.2 3.9  CL 105  --  105 102  CO2 28  --  29 30  GLUCOSE 263*  --  179* 140*  BUN 31*  --  30* 33*  CREATININE 2.33*  --  2.41* 2.49*  CALCIUM 8.3*  --  8.2* 8.4*  MG  --  1.7 2.0 1.9   GFR: Estimated Creatinine Clearance: 42.7 mL/min (A) (by C-G formula based on SCr of 2.49 mg/dL (H)). Liver Function Tests: Recent Labs  Lab 02/03/19 1435  AST 12*  ALT 10  ALKPHOS 33*  BILITOT 0.8  PROT 6.4*  ALBUMIN 2.7*   No results for input(s): LIPASE, AMYLASE in the last 168 hours. No results for input(s): AMMONIA in the last 168 hours. Coagulation Profile: Recent Labs  Lab 02/03/19 1435 02/04/19 0220 02/05/19 0502  INR 2.7* 2.8* 2.6*   Cardiac Enzymes: Recent Labs  Lab 02/03/19 1435 02/03/19 2040 02/04/19 0220 02/04/19 0956  TROPONINI <0.03 <0.03 <0.03 <0.03   BNP (last 3 results) No results for input(s): PROBNP in the last 8760 hours. HbA1C: Recent Labs    02/04/19 0220  HGBA1C 8.3*   CBG: Recent Labs  Lab 02/04/19 1146 02/04/19 1645 02/04/19 2218 02/05/19 0618 02/05/19 0752  GLUCAP 215* 184* 141* 133* 130*   Lipid Profile: Recent Labs    02/04/19 0220  CHOL 84  HDL 26*  LDLCALC 49  TRIG 47  CHOLHDL 3.2   Thyroid Function Tests: Recent Labs    02/03/19 2040  TSH 1.287   Anemia Panel: No results for input(s): VITAMINB12, FOLATE, FERRITIN, TIBC, IRON, RETICCTPCT in the last 72 hours. Sepsis Labs: No results for input(s): PROCALCITON, LATICACIDVEN in the last 168 hours.  Recent Results (from the past 240 hour(s))  SARS Coronavirus 2 [facility]  order, Performed in [place] hospital lab)     Status: None   Collection Time: 02/03/19  3:27 PM  Result Value Ref Range Status   SARS Coronavirus 2 NEGATIVE NEGATIVE Final    Comment: (NOTE) If result is NEGATIVE SARS-CoV-2 target nucleic acids are NOT DETECTED. The SARS-CoV-2 RNA is  generally detectable in upper and lower  respiratory specimens during the acute phase of infection. The lowest  concentration of SARS-CoV-2 viral copies this assay can detect is 250  copies / mL. A negative result does not preclude SARS-CoV-2 infection  and should not be used as the sole basis for treatment or other  patient management decisions.  A negative result may occur with  improper specimen collection / handling, submission of specimen other  than nasopharyngeal swab, presence of viral mutation(s) within the  areas targeted by this assay, and inadequate number of viral copies  (<250 copies / mL). A negative result must be combined with clinical  observations, patient history, and epidemiological information. If result is POSITIVE SARS-CoV-2 target nucleic acids are DETECTED. The SARS-CoV-2 RNA is generally detectable in upper and lower  respiratory specimens dur ing the acute phase of infection.  Positive  results are indicative of active infection with SARS-CoV-2.  Clinical  correlation with patient history and other diagnostic information is  necessary to determine patient infection status.  Positive results do  not rule out bacterial infection or co-infection with other viruses. If result is PRESUMPTIVE POSTIVE SARS-CoV-2 nucleic acids MAY BE PRESENT.   A presumptive positive result was obtained on the submitted specimen  and confirmed on repeat testing.  While 2019 novel coronavirus  (SARS-CoV-2) nucleic acids may be present in the submitted sample  additional confirmatory testing may be necessary for epidemiological  and / or clinical management purposes  to differentiate between   SARS-CoV-2 and other Sarbecovirus currently known to infect humans.  If clinically indicated additional testing with an alternate test  methodology [phone]) is advised. The SARS-CoV-2 RNA is generally  detectable in upper and lower respiratory sp ecimens during the acute  phase of infection. The expected result is Negative. Fact Sheet for Patients:  StrictlyIdeas.no Fact Sheet for Healthcare Providers: BankingDealers.co.za This test is not yet approved or cleared by the [place] FDA and has been authorized for detection and/or diagnosis of SARS-CoV-2 by FDA under an Emergency Use Authorization (EUA).  This EUA will remain in effect (meaning this test can be used) for the duration of the COVID-19 declaration under Section 564(b)(1) of the Act, 21 U.S.C. section 360bbb-3(b)(1), unless the authorization is terminated or revoked sooner. Performed at [facility], [place] [address], [place], [place] [address]   MRSA PCR Screening     Status: Abnormal   Collection Time: 02/04/19  9:38 AM  Result Value Ref Range Status   MRSA by PCR POSITIVE (A) NEGATIVE Final    Comment:        The GeneXpert MRSA Assay (FDA approved for NASAL specimens only), is one component of a comprehensive MRSA colonization surveillance program. It is not intended to diagnose MRSA infection nor to guide or monitor treatment for MRSA infections. RESULT CALLED TO, READ BACK BY AND VERIFIED WITH: Tyler Aas [id number] @ [address] Performed at [facility] [address] [place], [place] [address]          Radiology Studies: Ct Abdomen Pelvis Wo Contrast  Result Date: 02/05/2019 CLINICAL DATA:  Persistent nausea and vomiting. Concern for bowel obstruction EXAM: CT ABDOMEN AND PELVIS WITHOUT CONTRAST TECHNIQUE: Multidetector CT imaging of the abdomen and pelvis was performed following the standard protocol without  IV contrast. COMPARISON:  11/10/2017 FINDINGS: Lower chest: Streaky opacities in the lower lobes. Small pleural effusions. Cardiomegaly with biventricular pacer. Hepatobiliary: No focal liver abnormality. Apparent low-density in  the posterior right liver on reformats is most likely from motion based on other source images.Cholecystectomy. Small volume portal venous gas seen in the left liver. Pancreas: Atrophy. Spleen: Unremarkable. Adrenals/Urinary Tract: 16 mm right adrenal adenoma. No hydronephrosis or stone. Unremarkable bladder. Stomach/Bowel: Generalized gaseous distention of colon which also contains stool. No volvulus or rectal impaction. Proximal small bowel loops are gas distended and there are some decompressed small bowel loops in the right upper quadrant, but no discrete transition point and gas is intermittently seen and more distal small bowel period. The stomach is moderately distended and there is fairly extensive gastric wall pneumatosis. Small volume fluid/inflammation is seen around the fundus of the stomach. Vascular/Lymphatic: No acute vascular abnormality. Atherosclerotic calcification. Reproductive:Negative Other: No pneumoperitoneum. Musculoskeletal: Advanced spondylosis with multi-level ankylosis. These results were called by telephone at the time of interpretation on 02/05/2019 at 8:42 am to Dr. Grandville Silos , who verbally acknowledged these results. Please see Z vision dashboard for details of communication. IMPRESSION: 1. Moderately distended stomach with gastric pneumatosis and small volume portal venous gas. This is presumably related patient's history of repetitive vomiting, but please correlate with exam and lactate to exclude necrosis. There is some regional inflammation without pneumoperitoneum. 2. Gas distended proximal small bowel loops without discrete transition point. There has been prior proximal small bowel surgery. 3. Generalized distention of the colon with ileus pattern.  Electronically Signed   By: Monte Fantasia M.D.   On: 02/05/2019 08:42   Dg Chest 2 View  Result Date: 02/03/2019 CLINICAL DATA:  Shortness of breath.  Hypoxia. EXAM: CHEST - 2 VIEW COMPARISON:  Chest x-[place] dated 02/13/2018 and 01/26/2016 FINDINGS: Chronic cardiomegaly pacemaker in place. Slight new pulmonary vascular prominence. On the lateral view there is suggestion of posteriorly layered pleural effusions. No acute bone abnormality. IMPRESSION: 1. Chronic cardiomegaly. 2. New slight pulmonary vascular prominence with probable posterior pleural effusions. Electronically Signed   By: Lorriane Shire M.D.   On: 02/03/2019 15:36   Dg Chest Port 1 View  Result Date: 02/05/2019 CLINICAL DATA:  Vomiting and lethargy EXAM: PORTABLE CHEST 1 VIEW COMPARISON:  02/03/2019 FINDINGS: Chronic cardiomegaly and vascular pedicle widening. There is biventricular pacer leads from the left. Vascular congestion. No Kerley lines, effusion, or pneumothorax. No asymmetric airspace opacity IMPRESSION: Cardiomegaly and vascular congestion similar to recent study. Electronically Signed   By: [place] Fantasia M.D.   On: 02/05/2019 06:33   Dg Abd Portable 1v  Result Date: 02/05/2019 CLINICAL DATA:  Vomiting EXAM: PORTABLE ABDOMEN - 1 VIEW COMPARISON:  Portable exam 2312 hours compared to CT abdomen and pelvis 11/10/2017 FINDINGS: Pacer leads project over RIGHT atrium and RIGHT ventricle. Air-filled loops of bowel throughout abdomen, likely: An upper normal small bowel loops. Gas present to at least distal sigmoid colon. No definite bowel wall thickening or evidence of obstruction. Bones demineralized. IMPRESSION: Air-filled loops of large and small bowel without definite evidence of obstruction. Electronically Signed   By: Lavonia Dana M.D.   On: 02/05/2019 00:00   Dg Foot Complete Left  Result Date: 02/03/2019 CLINICAL DATA:  Chronic heel wound. EXAM: LEFT FOOT - COMPLETE 3+ VIEW COMPARISON:  02/13/2018 FINDINGS: There is  diffuse osteopenia. Slight chronic arthritic changes of the first MTP joint. Chronic arthritic changes of the ankle joint. Soft tissue swelling of the foot primarily on the dorsum of the forefoot. Slight dorsal spurring in the midfoot. No evidence of osteomyelitis. Chronic calcifications in the distal Achilles tendon. IMPRESSION: 1. No acute bone abnormalities.  2. No discrete osteomyelitis. 3. New soft tissue swelling of the foot. Electronically Signed   By: Lorriane Shire M.D.   On: 02/03/2019 15:38        Scheduled Meds:  Chlorhexidine Gluconate Cloth  6 each Topical Q0600   chlorpheniramine-HYDROcodone  5 mL Oral Once   furosemide  80 mg Intravenous Q12H   gabapentin  300 mg Oral TID   insulin aspart  0-5 Units Subcutaneous Q4H   mouth rinse  15 mL Mouth Rinse BID   metolazone  2.5 mg Oral Daily   metoprolol tartrate  5 mg Intravenous Q8H   mupirocin ointment  1 application Nasal BID   potassium chloride SA  20 mEq Oral BID   sodium chloride flush  3 mL Intravenous Q12H   spironolactone  25 mg Oral BID   warfarin  7.5 mg Oral ONCE-1800   Warfarin - Pharmacist Dosing Inpatient   Does not apply q1800   Continuous Infusions:  sodium chloride       LOS: 2 days    Time spent: 35 minutes    Irine Seal, MD [facility]  If 7PM-7AM, please contact night-coverage [email or web address] 02/05/2019, 9:36 AM

## 2019-02-05 NOTE — Progress Notes (Signed)
Patient coughing this morning and wheezing. More lethargic and drowsy than earlier in the shift. Abdomen is very distended and taut and patient continues to vomit/spit up brown contents. Oxygen saturations 88% on 2L nasal cannula. Turned up to 6L o2 on [place] and went up to 95%. Raliegh Ip Schorr, NP, respiratory therapy, and rapid response RN called. Respiratory and rapid response to bedside for assessment. New orders received for CT of abdomen without contrast. Patient was taken to CT for scan and transferred back into patient room. He is in no distress, and is arousable and can answer orientation questions appropriately. Vital signs are stable.

## 2019-02-05 NOTE — Progress Notes (Signed)
Took over care of patient from previous nurse.  Patient continues to be lethargic, will awake to voice and answer questions but it very sleepy.  Dr. Grandville Silos in to see patient this afternoon.  No nausea/vomiting today.  Continue with NPO sips with meds, if patient is alert enough.  Notify MD and pharmacist if patient unable to take pills by mouth per Dr. Grandville Silos. Will continue to monitor.

## 2019-02-06 DIAGNOSIS — I5042 Chronic combined systolic (congestive) and diastolic (congestive) heart failure: Secondary | ICD-10-CM

## 2019-02-06 LAB — GLUCOSE, CAPILLARY
Glucose-Capillary: 107 mg/dL — ABNORMAL HIGH (ref 70–99)
Glucose-Capillary: 110 mg/dL — ABNORMAL HIGH (ref 70–99)
Glucose-Capillary: 119 mg/dL — ABNORMAL HIGH (ref 70–99)
Glucose-Capillary: 125 mg/dL — ABNORMAL HIGH (ref 70–99)
Glucose-Capillary: 125 mg/dL — ABNORMAL HIGH (ref 70–99)
Glucose-Capillary: 202 mg/dL — ABNORMAL HIGH (ref 70–99)
Glucose-Capillary: 218 mg/dL — ABNORMAL HIGH (ref 70–99)
Glucose-Capillary: 241 mg/dL — ABNORMAL HIGH (ref 70–99)

## 2019-02-06 LAB — BASIC METABOLIC PANEL
Anion gap: 10 (ref 5–15)
BUN: 37 mg/dL — ABNORMAL HIGH (ref 8–23)
CO2: 33 mmol/L — ABNORMAL HIGH (ref 22–32)
Calcium: 8.2 mg/dL — ABNORMAL LOW (ref 8.9–10.3)
Chloride: 102 mmol/L (ref 98–111)
Creatinine, Ser: 2.71 mg/dL — ABNORMAL HIGH (ref 0.61–1.24)
GFR calc Af Amer: 27 mL/min — ABNORMAL LOW (ref >60)
GFR calc non Af Amer: 24 mL/min — ABNORMAL LOW (ref >60)
Glucose, Bld: 125 mg/dL — ABNORMAL HIGH (ref 70–99)
Potassium: 3.7 mmol/L (ref 3.5–5.1)
Sodium: 145 mmol/L (ref 135–145)

## 2019-02-06 LAB — PROTIME-INR
INR: 2.5 — ABNORMAL HIGH (ref 0.8–1.2)
Prothrombin Time: 26.5 seconds — ABNORMAL HIGH (ref 11.4–15.2)

## 2019-02-06 LAB — AMMONIA: Ammonia: 19 umol/L (ref 9–35)

## 2019-02-06 LAB — MAGNESIUM: Magnesium: 1.7 mg/dL (ref 1.7–2.4)

## 2019-02-06 MED ORDER — CARVEDILOL 25 MG PO TABS
25.0000 mg | ORAL_TABLET | Freq: Two times a day (BID) | ORAL | Status: DC
  Administered 2019-02-06 – 2019-02-12 (×12): 25 mg via ORAL
  Filled 2019-02-06 (×12): qty 1

## 2019-02-06 MED ORDER — FUROSEMIDE 40 MG PO TABS
80.0000 mg | ORAL_TABLET | Freq: Every day | ORAL | Status: DC
  Administered 2019-02-06 – 2019-02-09 (×4): 80 mg via ORAL
  Filled 2019-02-06 (×4): qty 2

## 2019-02-06 MED ORDER — HYDRALAZINE HCL 10 MG PO TABS
10.0000 mg | ORAL_TABLET | Freq: Three times a day (TID) | ORAL | Status: DC
  Administered 2019-02-06 – 2019-02-12 (×19): 10 mg via ORAL
  Filled 2019-02-06 (×19): qty 1

## 2019-02-06 MED ORDER — ISOSORBIDE DINITRATE 5 MG PO TABS
5.0000 mg | ORAL_TABLET | Freq: Three times a day (TID) | ORAL | Status: DC
  Administered 2019-02-06 – 2019-02-12 (×19): 5 mg via ORAL
  Filled 2019-02-06 (×21): qty 1

## 2019-02-06 MED ORDER — ROSUVASTATIN CALCIUM 10 MG PO TABS
10.0000 mg | ORAL_TABLET | Freq: Every day | ORAL | Status: DC
  Administered 2019-02-06 – 2019-02-12 (×7): 10 mg via ORAL
  Filled 2019-02-06 (×6): qty 1

## 2019-02-06 MED ORDER — WARFARIN SODIUM 5 MG PO TABS
5.0000 mg | ORAL_TABLET | Freq: Once | ORAL | Status: AC
  Administered 2019-02-06: 18:00:00 5 mg via ORAL
  Filled 2019-02-06: qty 1

## 2019-02-06 MED ORDER — DEXTROSE-NACL 5-0.9 % IV SOLN
INTRAVENOUS | Status: DC
  Administered 2019-02-06: 10:00:00 via INTRAVENOUS

## 2019-02-06 NOTE — Progress Notes (Signed)
Pharmacy made aware of patient not taking medication

## 2019-02-06 NOTE — Progress Notes (Signed)
OT Cancellation Note  Patient Details Name: Kurt Cross MRN: [id number] DOB: July 02, 1951   Cancelled Treatment:    Reason Eval/Treat Not Completed: Other (comment) Met with pt for OT treatment, pt declining stating he is fatigued and wishing to eat/waiting on meal. Pt eyes closing and difficulty maintaining arousal in conservation. Will continue to follow pt per OT POC as available and appropriate.  Zenovia Jarred, MSOT, OTR/L Behavioral Health OT/ Acute Relief OT [facility] Office: [phone]  Zenovia Jarred 02/06/2019, 2:21 PM

## 2019-02-06 NOTE — Progress Notes (Signed)
Kurt Cross [id number] October 27, 1950  CARE TEAM:  PCP: [name], MD  Outpatient Care Team: Patient Care Team: [name], MD as PCP - General (Family Medicine) Dorothy Spark, MD as PCP - Cardiology (Cardiology) Evans Lance, MD as PCP - Electrophysiology (Cardiology) Charolette Forward, MD as Consulting Physician (Cardiology) [name], MD as Consulting Physician (Pulmonary Disease) Michael Boston, MD as Consulting Physician (General Surgery) Sherilyn Banker, MD as Consulting Physician (Surgery)  Inpatient Treatment Team: Treatment Team: Attending Provider: [name], MD; Rounding Team: Joycelyn Das, MD; Consulting Physician: Lbcardiology, Michae Kava, MD; Technician: Demaris Callander, [place]; Technician: Angie Fava, [place]; Consulting Physician: Nolon Nations, MD; Registered Nurse: Lavona Mound, RN; Registered Nurse: Gonzella Lex, RN; Occupational Therapist: Lavon Paganini, OT; Registered Nurse: Danie Chandler, RN; Registered Nurse: Lavell Luster, RN   Problem List:   Principal Problem:   Acute exacerbation of CHF (congestive heart failure) ([place]) Active Problems:   Obesity, Class III, BMI 40-49.9 (morbid obesity) ([place])   Diabetes mellitus, insulin dependent (IDDM), uncontrolled ([place])   Hyperlipidemia   Obstructive sleep apnea   Essential hypertension   Chronic combined systolic and diastolic CHF (congestive heart failure) ([place])   Thoracic aortic aneurysm ([facility])   Atrial fibrillation/flutter   Debility   S/P placement of cardiac pacemaker   Chronic anticoagulation   CKD (chronic kidney disease), stage III ([place])   Diabetic ulcer of ankle associated with type 2 diabetes mellitus, limited to breakdown of skin ([place])   Pneumatosis of intestines   Nausea & vomiting      * No surgery found *      Assessment  Ileus of uncertain etiology in a patient with multiple medical problems  Questionable gastric pneumatosis  but without any evidence of peritonitis, shock, or decline  [facility] Stay = 3 days)  Plan:  -There is no evidence of any peritonitis or shock at this time.  He has no longer has any emesis.  Perhaps the gastric pneumatosis is related to the severe retching his radiology implies.  Certainly is not acting toxic.  Lactic acid normal.  No evidence of shock or peritonitis.  Reasonable to do a trial of clear liquids and see how he does.  If he tolerates that, advance to solid diet with bowel regimen.  If has worsening nausea or emesis again, reconsider nasogastric tube and bowel rest.  He refuses nasogastric tube, compromising our ability to more accurately treat and assess him.  However, he has severe comorbidities and already is a DNR, so I am not going to be too aggressive with him.  He does not seem to have an abdominal catastrophe.  He is a very poor operative candidate with his heart failure and renal disease and morbid obesity and hostile abdomen from numerous prior surgeries.  He does not want to have anymore surgery anyway.  -Management of his cardiac and renal issues through primary service and other specialist. -VTE prophylaxis- SCDs, etc -mobilize as tolerated to help recovery  20 minutes spent in review, evaluation, examination, counseling, and coordination of care.  More than 50% of that time was spent in counseling.  02/06/2019    Subjective: (Chief complaint)  Resting.  Awakens.  Answers most questions but does not want to be bothered.  Following commands but does not feel like talking.  Moving all extremities.  Denies any abdominal pain.  Notes he did have bad nausea and vomiting 2 days ago but not yesterday  nor this morning.  Objective:  Vital signs:  Vitals:   02/05/19 2048 02/06/19 0404 02/06/19 0427 02/06/19 0553  BP: 121/62 140/74  128/71  Pulse: 69 71  68  Resp: 18 17    Temp: 98.1 F (36.7 C) 98.8 F (37.1 C)    TempSrc:  Oral    SpO2: 99% 99%    Weight:    (!) 137.7 kg   Height:        Last BM Date: 02/04/19  Intake/Output   Yesterday:  05/01 0701 - 05/02 0700 In: 100.1 [IV Piggyback:100.1] Out: 1000 [Urine:1000] This shift:  No intake/output data recorded.  Bowel function:  Flatus: YES  BM:  YES  Drain: (No drain)   Physical Exam:  General: Pt sleeping.  Oriented x4.  Answers basic questions.  Does not want to discuss past medical history. Eyes: PERRL, normal EOM.  Sclera clear.  No icterus Neuro: CN II-XII intact w/o focal sensory/motor deficits. Lymph: No head/neck/groin lymphadenopathy Psych:  No delerium/psychosis/paranoia HENT: Normocephalic, Mucus membranes moist.  No thrush Neck: Supple, No tracheal deviation Chest: No chest wall pain w good excursion CV:  Pulses intact.  Regular rhythm MS: Normal AROM mjr joints.  No obvious deformity  Abdomen: Soft.morbidly obese  Mildy distended.  Nontender.  No fistula, ostomy, wound, or scar.  No evidence of peritonitis.  No incarcerated hernias.  Ext:  No deformity.  No mjr edema.  No cyanosis Skin: No petechiae / purpura  Results:   Labs: Results for orders placed or performed during the hospital encounter of 02/03/19 (from the past 48 hour(s))  Glucose, capillary     Status: Abnormal   Collection Time: 02/04/19  8:04 AM  Result Value Ref Range   Glucose-Capillary 162 (H) 70 - 99 mg/dL  MRSA PCR Screening     Status: Abnormal   Collection Time: 02/04/19  9:38 AM  Result Value Ref Range   MRSA by PCR POSITIVE (A) NEGATIVE    Comment:        The GeneXpert MRSA Assay (FDA approved for NASAL specimens only), is one component of a comprehensive MRSA colonization surveillance program. It is not intended to diagnose MRSA infection nor to guide or monitor treatment for MRSA infections. RESULT CALLED TO, READ BACK BY AND VERIFIED WITH: Tyler Aas [id number] @ [address] Performed at [facility] [address] [place], [place] [address]     Troponin I - Now Then Q6H     Status: None   Collection Time: 02/04/19  9:56 AM  Result Value Ref Range   Troponin I <0.03 <0.03 ng/mL    Comment: Performed at [facility], [place] [address], [place], [place] [address]  Glucose, capillary     Status: Abnormal   Collection Time: 02/04/19 11:46 AM  Result Value Ref Range   Glucose-Capillary 215 (H) 70 - 99 mg/dL  Glucose, capillary     Status: Abnormal   Collection Time: 02/04/19  4:45 PM  Result Value Ref Range   Glucose-Capillary 184 (H) 70 - 99 mg/dL  Glucose, capillary     Status: Abnormal   Collection Time: 02/04/19 10:18 PM  Result Value Ref Range   Glucose-Capillary 141 (H) 70 - 99 mg/dL  Basic metabolic panel     Status: Abnormal   Collection Time: 02/05/19  5:02 AM  Result Value Ref Range   Sodium 143 135 - 145 mmol/L   Potassium 3.9 3.5 - 5.1 mmol/L   Chloride  102 98 - 111 mmol/L   CO2 30 22 - 32 mmol/L   Glucose, Bld 140 (H) 70 - 99 mg/dL   BUN 33 (H) 8 - 23 mg/dL   Creatinine, Ser 2.49 (H) 0.61 - 1.24 mg/dL   Calcium 8.4 (L) 8.9 - 10.3 mg/dL   GFR calc non Af Amer 26 (L) >60 mL/min   GFR calc Af Amer 30 (L) >60 mL/min   Anion gap 11 5 - 15    Comment: Performed at [facility], [place] [address], [place], [place] [address]  Protime-INR     Status: Abnormal   Collection Time: 02/05/19  5:02 AM  Result Value Ref Range   Prothrombin Time 27.3 (H) 11.4 - 15.2 seconds   INR 2.6 (H) 0.8 - 1.2    Comment: (NOTE) INR goal varies based on device and disease states. Performed at [facility], [place] [address] [place], [place] [address]   Magnesium     Status: None   Collection Time: 02/05/19  5:02 AM  Result Value Ref Range   Magnesium 1.9 1.7 - 2.4 mg/dL    Comment: Performed at [facility], [place] [address], [place], [place] [address]  Glucose, capillary     Status: Abnormal   Collection Time: 02/05/19  6:18 AM  Result Value Ref Range    Glucose-Capillary 133 (H) 70 - 99 mg/dL  Glucose, capillary     Status: Abnormal   Collection Time: 02/05/19  7:52 AM  Result Value Ref Range   Glucose-Capillary 130 (H) 70 - 99 mg/dL  Lactic acid, plasma     Status: None   Collection Time: 02/05/19  9:37 AM  Result Value Ref Range   Lactic Acid, Venous 1.1 0.5 - 1.9 mmol/L    Comment: Performed at [facility], [place] [address], [place], [place] [address]  Glucose, capillary     Status: Abnormal   Collection Time: 02/05/19 11:55 AM  Result Value Ref Range   Glucose-Capillary 109 (H) 70 - 99 mg/dL  Glucose, capillary     Status: None   Collection Time: 02/05/19  4:33 PM  Result Value Ref Range   Glucose-Capillary 73 70 - 99 mg/dL  Glucose, capillary     Status: Abnormal   Collection Time: 02/05/19  6:33 PM  Result Value Ref Range   Glucose-Capillary 62 (L) 70 - 99 mg/dL  Glucose, capillary     Status: Abnormal   Collection Time: 02/05/19  7:03 PM  Result Value Ref Range   Glucose-Capillary 113 (H) 70 - 99 mg/dL  Glucose, capillary     Status: None   Collection Time: 02/05/19  8:45 PM  Result Value Ref Range   Glucose-Capillary 74 70 - 99 mg/dL  Glucose, capillary     Status: Abnormal   Collection Time: 02/05/19  9:52 PM  Result Value Ref Range   Glucose-Capillary 149 (H) 70 - 99 mg/dL  Glucose, capillary     Status: Abnormal   Collection Time: 02/05/19 11:29 PM  Result Value Ref Range   Glucose-Capillary 131 (H) 70 - 99 mg/dL  Glucose, capillary     Status: Abnormal   Collection Time: 02/06/19  2:05 AM  Result Value Ref Range   Glucose-Capillary 125 (H) 70 - 99 mg/dL  Glucose, capillary     Status: Abnormal   Collection Time: 02/06/19  4:00 AM  Result Value Ref Range   Glucose-Capillary 107 (H) 70 - 99 mg/dL  Basic  metabolic panel     Status: Abnormal   Collection Time: 02/06/19  5:13 AM  Result Value Ref Range   Sodium 145 135 - 145 mmol/L   Potassium 3.7 3.5 - 5.1 mmol/L   Chloride 102 98 - 111  mmol/L   CO2 33 (H) 22 - 32 mmol/L   Glucose, Bld 125 (H) 70 - 99 mg/dL   BUN 37 (H) 8 - 23 mg/dL   Creatinine, Ser 2.71 (H) 0.61 - 1.24 mg/dL   Calcium 8.2 (L) 8.9 - 10.3 mg/dL   GFR calc non Af Amer 24 (L) >60 mL/min   GFR calc Af Amer 27 (L) >60 mL/min   Anion gap 10 5 - 15    Comment: Performed at [facility], [place] [address], [place], [place] [address]  Protime-INR     Status: Abnormal   Collection Time: 02/06/19  5:13 AM  Result Value Ref Range   Prothrombin Time 26.5 (H) 11.4 - 15.2 seconds   INR 2.5 (H) 0.8 - 1.2    Comment: (NOTE) INR goal varies based on device and disease states. Performed at [facility], [place] [address] [place], [place] [address]   Glucose, capillary     Status: Abnormal   Collection Time: 02/06/19  5:50 AM  Result Value Ref Range   Glucose-Capillary 125 (H) 70 - 99 mg/dL    Imaging / Studies: Ct Abdomen Pelvis Wo Contrast  Result Date: 02/05/2019 CLINICAL DATA:  Persistent nausea and vomiting. Concern for bowel obstruction EXAM: CT ABDOMEN AND PELVIS WITHOUT CONTRAST TECHNIQUE: Multidetector CT imaging of the abdomen and pelvis was performed following the standard protocol without IV contrast. COMPARISON:  11/10/2017 FINDINGS: Lower chest: Streaky opacities in the lower lobes. Small pleural effusions. Cardiomegaly with biventricular pacer. Hepatobiliary: No focal liver abnormality. Apparent low-density in the posterior right liver on reformats is most likely from motion based on other source images.Cholecystectomy. Small volume portal venous gas seen in the left liver. Pancreas: Atrophy. Spleen: Unremarkable. Adrenals/Urinary Tract: 16 mm right adrenal adenoma. No hydronephrosis or stone. Unremarkable bladder. Stomach/Bowel: Generalized gaseous distention of colon which also contains stool. No volvulus or rectal impaction. Proximal small bowel loops are gas distended and there are some decompressed small bowel loops in  the right upper quadrant, but no discrete transition point and gas is intermittently seen and more distal small bowel period. The stomach is moderately distended and there is fairly extensive gastric wall pneumatosis. Small volume fluid/inflammation is seen around the fundus of the stomach. Vascular/Lymphatic: No acute vascular abnormality. Atherosclerotic calcification. Reproductive:Negative Other: No pneumoperitoneum. Musculoskeletal: Advanced spondylosis with multi-level ankylosis. These results were called by telephone at the time of interpretation on 02/05/2019 at 8:42 am to Dr. Grandville Silos , who verbally acknowledged these results. Please see Z vision dashboard for details of communication. IMPRESSION: 1. Moderately distended stomach with gastric pneumatosis and small volume portal venous gas. This is presumably related patient's history of repetitive vomiting, but please correlate with exam and lactate to exclude necrosis. There is some regional inflammation without pneumoperitoneum. 2. Gas distended proximal small bowel loops without discrete transition point. There has been prior proximal small bowel surgery. 3. Generalized distention of the colon with ileus pattern. Electronically Signed   By: Monte Fantasia M.D.   On: 02/05/2019 08:42   Dg Chest Port 1 View  Result Date: 02/05/2019 CLINICAL DATA:  Vomiting and lethargy EXAM: PORTABLE CHEST 1 VIEW COMPARISON:  02/03/2019 FINDINGS: Chronic cardiomegaly and vascular pedicle widening. There  is biventricular pacer leads from the left. Vascular congestion. No Kerley lines, effusion, or pneumothorax. No asymmetric airspace opacity IMPRESSION: Cardiomegaly and vascular congestion similar to recent study. Electronically Signed   By: Monte Fantasia M.D.   On: 02/05/2019 06:33   Dg Abd Portable 1v  Result Date: 02/05/2019 CLINICAL DATA:  Vomiting EXAM: PORTABLE ABDOMEN - 1 VIEW COMPARISON:  Portable exam 2312 hours compared to CT abdomen and pelvis 11/10/2017  FINDINGS: Pacer leads project over RIGHT atrium and RIGHT ventricle. Air-filled loops of bowel throughout abdomen, likely: An upper normal small bowel loops. Gas present to at least distal sigmoid colon. No definite bowel wall thickening or evidence of obstruction. Bones demineralized. IMPRESSION: Air-filled loops of large and small bowel without definite evidence of obstruction. Electronically Signed   By: Lavonia Dana M.D.   On: 02/05/2019 00:00    Medications / Allergies: per chart  Antibiotics: Anti-infectives (From admission, onward)   Start     Dose/Rate Route Frequency Ordered Stop   02/05/19 1200  piperacillin-tazobactam (ZOSYN) IVPB 3.375 g     3.375 g 12.5 mL/hr over 240 Minutes Intravenous Every 8 hours 02/05/19 0950          Note: Portions of this report may have been transcribed using voice recognition software. Every effort was made to ensure accuracy; however, inadvertent computerized transcription errors may be present.   Any transcriptional errors that result from this process are unintentional.     [name], MD, FACS, MASCRS Gastrointestinal and Minimally Invasive Surgery    [address], [place], [place] [address] [phone] Main / Paging [phone] Fax

## 2019-02-06 NOTE — Progress Notes (Signed)
Entered room and offered patient CPAP tonight, patient just shakes his head no.  Will be available if patient changes his mind.

## 2019-02-06 NOTE — Progress Notes (Addendum)
Kurt Cross for warfarin Indication: Afib/flutter  No Known Allergies  Patient Measurements: Height: 5\' 11"  (180.3 cm) Weight: (!) 303 lb 9.2 oz (137.7 kg) IBW/kg (Calculated) : 75.3  Vital Signs: Temp: 98.8 F (37.1 C) (05/02 0404) Temp Source: Oral (05/02 0404) BP: 128/71 (05/02 0553) Pulse Rate: 68 (05/02 0553)  Labs: Recent Labs    02/03/19 1435 02/03/19 2040 02/04/19 0220 02/04/19 0956 02/05/19 0502 02/06/19 0513  HGB 13.6  --   --   --   --   --   HCT 43.8  --   --   --   --   --   PLT 210  --   --   --   --   --   LABPROT 28.6*  --  28.8*  --  27.3* 26.5*  INR 2.7*  --  2.8*  --  2.6* 2.5*  CREATININE 2.33*  --  2.41*  --  2.49* 2.71*  TROPONINI <0.03 <0.03 <0.03 <0.03  --   --     Estimated Creatinine Clearance: 38.6 mL/min (A) (by C-G formula based on SCr of 2.71 mg/dL (H)).   Medications:  Medications Prior to Admission  Medication Sig Dispense Refill Last Dose  . carvedilol (COREG) 25 MG tablet Take 25 mg by mouth 2 (two) times daily.  3 Past Month at Unknown time  . cyclobenzaprine (FLEXERIL) 5 MG tablet Take 5 mg by mouth 3 (three) times daily.   Past Month at Unknown time  . furosemide (LASIX) 80 MG tablet Take 80 mg by mouth daily.    Past Month at Unknown time  . gabapentin (NEURONTIN) 300 MG capsule Take 300 mg by mouth 3 (three) times daily.    Past Month at Unknown time  . insulin NPH Human (HUMULIN N,NOVOLIN N) 100 UNIT/ML injection Inject 20 units in the morning and 20 units in the evening.   Past Month at Unknown time  . insulin regular (NOVOLIN R,HUMULIN R) 100 units/mL injection Use with sliding scale.   Past Month at Unknown time  . KLOR-CON M20 20 MEQ tablet Take 20 mg by mouth 2 (two) times daily.  3 Past Month at Unknown time  . metolazone (ZAROXOLYN) 2.5 MG tablet Take 2.5 mg by mouth daily.   Past Month at Unknown time  . rosuvastatin (CRESTOR) 10 MG tablet Take 10 mg by mouth daily.   Past Month at  Unknown time  . traZODone (DESYREL) 50 MG tablet Take 100 mg by mouth at bedtime as needed for sleep.   3 Past Month at Unknown time  . warfarin (COUMADIN) 7.5 MG tablet Take 7.5 mg by mouth daily.   02/03/2019 at Unknown time  . Adhesive Tape (ADHESIVE PAPER) TAPE To use with pads/dressing. 100 each 5   . Adhesive Tape TAPE To use with dressings/pads. 100 each 5   . Blood Glucose Monitoring Suppl (ACCU-CHEK AVIVA) device Use as instructed 1 each 0 Taking  . Disposable Gloves MISC To use with wound dressing/changing. 100 each 5   . Gauze Bandages MISC To use daily. 100 each 5   . Gauze Pads & Dressings (ABDOMINAL PAD) 8"X10" PADS 1 each by Does not apply route as needed. 100 each 5   . Gauze Pads & Dressings (AMD FOAM DRESSING) 4"X4" PADS To use daily. 100 each 5   . Gauze Pads & Dressings (KERLIX BANDAGE ROLL) MISC To use daily. 100 each 5   . Skin Protectants, Misc. (EUCERIN) cream Apply  topically as needed for wound care. 397 g 5   . spironolactone (ALDACTONE) 25 MG tablet Take 1 tablet (25 mg total) by mouth 2 (two) times daily. (Patient not taking: Reported on 02/03/2019) 30 tablet 0 Not Taking at Unknown time   Scheduled:  . carvedilol  25 mg Oral BID WC  . Chlorhexidine Gluconate Cloth  6 each Topical Q0600  . chlorpheniramine-HYDROcodone  5 mL Oral Once  . furosemide  80 mg Oral Daily  . gabapentin  300 mg Oral TID  . hydrALAZINE  10 mg Oral TID  . insulin aspart  0-5 Units Subcutaneous Q4H  . isosorbide dinitrate  5 mg Oral TID  . mouth rinse  15 mL Mouth Rinse BID  . metolazone  2.5 mg Oral Daily  . mupirocin ointment  1 application Nasal BID  . pantoprazole (PROTONIX) IV  40 mg Intravenous Q24H  . potassium chloride SA  20 mEq Oral BID  . sodium chloride flush  3 mL Intravenous Q12H  . spironolactone  25 mg Oral BID  . warfarin  7.5 mg Oral ONCE-1800  . Warfarin - Pharmacist Dosing Inpatient   Does not apply q1800   PRN: sodium chloride, acetaminophen, hydrocerin,  ondansetron (ZOFRAN) IV, sodium chloride flush, traZODone  Assessment: 74 yoM with PMH sCHF, Afib/flutter on warfarin, AAA, CKD3, complete HB s/p pacer, DM2, admitted for AECHF. Pharmacy to continue warfarin.   Baseline INR 2.7  Prior anticoagulation: warfarin 7.5 mg daily, LD 4/29 - some confusion about last dose since patient has history of noncompliance with other meds, is currently altered and changed his story during the admission. However, given INR 2.7, d/w TRH MD and felt reasonably confident that dose was taken today  Significant events: 5/1 unable to tolerate POs and missed warfarin dose. Provider opted not to convert to IV warfarin at that time  Today, 02/06/2019:  INR remains therapeutic; only slightly lower after missing warfarin yesterday  CBC ordered for tomorrow; WNL on 4/29  Major drug interactions: none currently  No bleeding issues per notes  Diet ordered - not eating so far but per RN taking meds today  Goal of Therapy: INR 2-3  Plan:  Warfarin 5 mg today; will continue with reduced dose as patient not eating or receiving tube feeds. INR still therapeutic but if unable to take dose tonight will need to consider parenteral anticoag options  Daily INR  CBC at least q72 hr while on warfarin - due 5/2  Monitor for signs of bleeding or thrombosis   Reuel Boom, PharmD, BCPS [phone] 02/06/2019, 10:25 AM

## 2019-02-06 NOTE — Progress Notes (Signed)
Progress Note  Patient Name: Kurt Cross Date of Encounter: 02/06/2019  Primary Cardiologist: Ena Dawley, MD  Subjective   Continues to be very drowsy, does awaken and mumbles several word answers. Denies any complaints to me.  Inpatient Medications    Scheduled Meds:  Chlorhexidine Gluconate Cloth  6 each Topical Q0600   chlorpheniramine-HYDROcodone  5 mL Oral Once   furosemide  80 mg Intravenous Daily   gabapentin  300 mg Oral TID   insulin aspart  0-5 Units Subcutaneous Q4H   mouth rinse  15 mL Mouth Rinse BID   metolazone  2.5 mg Oral Daily   metoprolol tartrate  5 mg Intravenous Q8H   mupirocin ointment  1 application Nasal BID   pantoprazole (PROTONIX) IV  40 mg Intravenous Q24H   potassium chloride SA  20 mEq Oral BID   sodium chloride flush  3 mL Intravenous Q12H   spironolactone  25 mg Oral BID   warfarin  7.5 mg Oral ONCE-1800   Warfarin - Pharmacist Dosing Inpatient   Does not apply q1800   Continuous Infusions:  sodium chloride     dextrose 5 % and 0.9% NaCl     piperacillin-tazobactam (ZOSYN)  IV 3.375 g (02/06/19 0426)   PRN Meds: sodium chloride, acetaminophen, hydrocerin, ondansetron (ZOFRAN) IV, sodium chloride flush, traZODone   Vital Signs    Vitals:   02/05/19 2048 02/06/19 0404 02/06/19 0427 02/06/19 0553  BP: 121/62 140/74  128/71  Pulse: 69 71  68  Resp: 18 17    Temp: 98.1 F (36.7 C) 98.8 F (37.1 C)    TempSrc:  Oral    SpO2: 99% 99%    Weight:   (!) 137.7 kg   Height:        Intake/Output Summary (Last 24 hours) at 02/06/2019 0823 Last data filed at 02/06/2019 0553 Gross per 24 hour  Intake 100.05 ml  Output 1000 ml  Net -899.95 ml   Last 3 Weights 02/06/2019 02/03/2019 02/03/2019  Weight (lbs) 303 lb 9.2 oz 313 lb 0.9 oz 289 lb  Weight (kg) 137.7 kg 142 kg 131.09 kg      Telemetry    Atrial fib/flutter with V pacing - Personally Reviewed  ECG    No new - Personally Reviewed  Physical Exam   GEN:  No acute distress.   Neck: JVD difficult to assess due to body habitus, appears to be elevated to low neck at 60 degrees Cardiac: RRR, no murmurs, rubs, or gallops.  Respiratory: Clear to auscultation bilaterally in anterolateral fields, cannot cooperate to sit up GI: Soft, nontender, non-distended  MS: trace bilateral LE edema at shins, 1+ at ankles; LE wound without apparent drainage Neuro:  Nonfocal  Psych: sleepy, does awaken to voice but minimally conversational  Labs    Chemistry Recent Labs  Lab 02/03/19 1435 02/04/19 0220 02/05/19 0502 02/06/19 0513  NA 140 142 143 145  K 4.3 4.2 3.9 3.7  CL 105 105 102 102  CO2 28 29 30  33*  GLUCOSE 263* 179* 140* 125*  BUN 31* 30* 33* 37*  CREATININE 2.33* 2.41* 2.49* 2.71*  CALCIUM 8.3* 8.2* 8.4* 8.2*  PROT 6.4*  --   --   --   ALBUMIN 2.7*  --   --   --   AST 12*  --   --   --   ALT 10  --   --   --   ALKPHOS 33*  --   --   --  BILITOT 0.8  --   --   --   GFRNONAA 28* 27* 26* 24*  GFRAA 33* 31* 30* 27*  ANIONGAP 7 8 11 10      Hematology Recent Labs  Lab 02/03/19 1435  WBC 8.5  RBC 4.29  HGB 13.6  HCT 43.8  MCV 102.1*  MCH 31.7  MCHC 31.1  RDW 17.2*  PLT 210    Cardiac Enzymes Recent Labs  Lab 02/03/19 1435 02/03/19 2040 02/04/19 0220 02/04/19 0956  TROPONINI <0.03 <0.03 <0.03 <0.03   No results for input(s): TROPIPOC in the last 168 hours.   BNP Recent Labs  Lab 02/03/19 1435  BNP 194.2*     DDimer No results for input(s): DDIMER in the last 168 hours.   Radiology    Ct Abdomen Pelvis Wo Contrast  Result Date: 02/05/2019 CLINICAL DATA:  Persistent nausea and vomiting. Concern for bowel obstruction EXAM: CT ABDOMEN AND PELVIS WITHOUT CONTRAST TECHNIQUE: Multidetector CT imaging of the abdomen and pelvis was performed following the standard protocol without IV contrast. COMPARISON:  11/10/2017 FINDINGS: Lower chest: Streaky opacities in the lower lobes. Small pleural effusions. Cardiomegaly with  biventricular pacer. Hepatobiliary: No focal liver abnormality. Apparent low-density in the posterior right liver on reformats is most likely from motion based on other source images.Cholecystectomy. Small volume portal venous gas seen in the left liver. Pancreas: Atrophy. Spleen: Unremarkable. Adrenals/Urinary Tract: 16 mm right adrenal adenoma. No hydronephrosis or stone. Unremarkable bladder. Stomach/Bowel: Generalized gaseous distention of colon which also contains stool. No volvulus or rectal impaction. Proximal small bowel loops are gas distended and there are some decompressed small bowel loops in the right upper quadrant, but no discrete transition point and gas is intermittently seen and more distal small bowel period. The stomach is moderately distended and there is fairly extensive gastric wall pneumatosis. Small volume fluid/inflammation is seen around the fundus of the stomach. Vascular/Lymphatic: No acute vascular abnormality. Atherosclerotic calcification. Reproductive:Negative Other: No pneumoperitoneum. Musculoskeletal: Advanced spondylosis with multi-level ankylosis. These results were called by telephone at the time of interpretation on 02/05/2019 at 8:42 am to Dr. Grandville Silos , who verbally acknowledged these results. Please see [employer] dashboard for details of communication. IMPRESSION: 1. Moderately distended stomach with gastric pneumatosis and small volume portal venous gas. This is presumably related patient's history of repetitive vomiting, but please correlate with exam and lactate to exclude necrosis. There is some regional inflammation without pneumoperitoneum. 2. Gas distended proximal small bowel loops without discrete transition point. There has been prior proximal small bowel surgery. 3. Generalized distention of the colon with ileus pattern. Electronically Signed   By: Monte Fantasia M.D.   On: 02/05/2019 08:42   Dg Chest Port 1 View  Result Date: 02/05/2019 CLINICAL DATA:  Vomiting  and lethargy EXAM: PORTABLE CHEST 1 VIEW COMPARISON:  02/03/2019 FINDINGS: Chronic cardiomegaly and vascular pedicle widening. There is biventricular pacer leads from the left. Vascular congestion. No Kerley lines, effusion, or pneumothorax. No asymmetric airspace opacity IMPRESSION: Cardiomegaly and vascular congestion similar to recent study. Electronically Signed   By: Monte Fantasia M.D.   On: 02/05/2019 06:33   Dg Abd Portable 1v  Result Date: 02/05/2019 CLINICAL DATA:  Vomiting EXAM: PORTABLE ABDOMEN - 1 VIEW COMPARISON:  Portable exam 2312 hours compared to CT abdomen and pelvis 11/10/2017 FINDINGS: Pacer leads project over RIGHT atrium and RIGHT ventricle. Air-filled loops of bowel throughout abdomen, likely: An upper normal small bowel loops. Gas present to at least distal sigmoid colon. No  definite bowel wall thickening or evidence of obstruction. Bones demineralized. IMPRESSION: Air-filled loops of large and small bowel without definite evidence of obstruction. Electronically Signed   By: Lavonia Dana M.D.   On: 02/05/2019 00:00    Cardiac Studies   Echo personally reviewed, from 02/04/19:  1. The left ventricle has moderately reduced systolic function, with an ejection fraction of 35-40%. The cavity size was normal. There is moderate concentric left ventricular hypertrophy. Left ventricular diastolic Doppler parameters are consistent with pseudonormalization.  2. The right ventricle was not assessed. The cavity was mildly enlarged. Right ventricular systolic pressure is moderately elevated with an estimated pressure of 44.6 mmHg.  3. The mitral valve is grossly normal.  4. The tricuspid valve is grossly normal. Tricuspid valve regurgitation is mild-moderate.  5. Mild thickening of the aortic valve. Aortic valve regurgitation is mild by color flow Doppler. No stenosis of the aortic valve.  6. There is moderate dilatation of the ascending aorta and of the aortic root.  7. Pulmonary  hypertension is moderate.  8. The inferior vena cava was dilated in size with <50% respiratory variability.   Patient Profile     68 y.o. male with PMH non-obstructive CAD, permanent atrial fibrillation on coumadin, complete heart block s/p PPM with upgrade to CRT-P 9798, chronic systolic and diastolic heart failure, OSA, HTN, HLD, type II diabetes, CKD stage 3 bordering on 4 who presented with acute on chronic systolic heart failure, likely in the setting of medication nonadherence. We are following at the request of Dr. Grandville Silos for management of heart failure.  Assessment & Plan    Acute on chronic systolic and diastolic heart failure: SOB, orthopnea, weight gain, hypoxia in the setting of at least 1 month per patient of not using lasix at home -echo done yesterday with EF 35-40%, similar to prior, with mildly elevated filling pressures and grade 2 DD -Admission weight 142 kg (though on same day, has a weight charted as 131 kg). Today weighed at 137.7.  -I/O net negative 3.6 L -Cr 2.71 today (from 2.33 on admission) -reinforced importance of med adherence, heart failure education, including daily weights, salt avoidance, fluid restriction, activity recommendations. I am not sure how much he understands this based on our conversations. Plan is for him to go home per primary team, and he will need home assistance with this. -Was on lasix 80 mg daily, metolazone 2.5 mg daily, and spironolactone 25 mg BID at home. -As he is tolerating other pills, and with rising creatinine with IV, will change to oral diuretics today.  If he does not tolerate orals, then can use IV substitution -renal function limits ACEi/ARB/ARNI use. If his creatinine continues to rise chronically, may also need to cut back on spironolactone.  -on carvedilol 25 mg BID chronically, but was changed to IV metoprolol given GI rest. This is not equivalent from a heart failure perspective, so will return to oral dosing today -his  renal function is borderline, but with his diabetes and heart failure, could consider SGLT2 inhibitor. Will defer to endo/medicine on this, but my concern would be his renal function and the amount of diuretics he is on concomitantly would complicate use of this. -I do not think he will be able to do hydralazine/isordil TID post discharge. Per Dr. Francesca Oman note in 2017, he had issues with this in the past. I would like to start him at a low dose in the hospital, and if he goes to a facility, he may be  able to do this dosing short term, but at home I suspect best we can hope for is BID. Would benefit from med assistance at home. Will start isordil 5-hydralazine 10 mg TID and uptitrate as able. -would set up for CPAP as this may exacerbate his HF, given reported sleep apnea. He apparently declined this while inpatient  Afib, pacemaker dependent with CRT-P: continue coumadin, followed by Dr. Rayann Heman.  HLD: continue statin HTN: adding isordil/hydralazine as above type II diabetes per primary team.  TAA history 2014: Cannot have repeat CTA for TAA given his currently kidney function. On echo, measured at 4.6 cm. Defer routine follow up for TAA to PCP as outpatient as he is not currently symptomatic.  TIME SPENT WITH PATIENT: 25 minutes of direct patient care. More than 50% of that time was spent on coordination of care and counseling regarding HF management.  Buford Dresser, MD, PhD [facility]   For questions or updates, please contact [place] Please consult [email or web address] for contact info under     Signed, Buford Dresser, MD  02/06/2019, 8:23 AM

## 2019-02-06 NOTE — Progress Notes (Signed)
PROGRESS NOTE    Kurt Cross  ZHY:[id number] DOB: [birth year] DOA: 02/03/2019 PCP: [name], MD   Brief Narrative:  Kurt Cross is a 68 y.o. male with medical history significant of chronic systolic heart failure, A. fib/a flutter, AAA, chronic kidney disease stage III, complete heart block status post PPM, chronic constipation, type 2 diabetes, chronic heel wound who presents to the ED with a one-week history of worsening shortness of breath and worsening lower extremity edema.  Patient states lower extremity edema has been ongoing for greater than 1 week.  Patient also endorses a 8 to 10 pound weight gain over the past 2 weeks.  Patient denies any fevers, no chest pain, no nausea, no cough, no melena, no hematemesis, no hematochezia.  Patient does endorse a bout of emesis 1 week prior to admission.  Patient denies any diarrhea, no constipation.  Patient does endorse some soft stools.  Patient denies any abdominal pain.  No syncopal episodes, no dietary indiscretions.  Patient does endorse some dysuria.  Patient is noted in his chart has a history of medical noncompliance patient states has missed 1 weeks worth of his Crestor and furosemide stating he ran out of his medications.  Patient also endorses decreased oral intake and some insomnia.  Patient stated that his wife made him come to the ED.  ED Course: Patient seen in the ED, comprehensive metabolic profile with a glucose of 263, BUN of 31, creatinine of 2.33, albumin of 2.7 otherwise was within normal limits.  BNP of 194.2.  Troponin was negative.  CBC unremarkable.  INR of 2.7.  COVID-19 was negative.  Chest x-Sitts done with chronic cardiomegaly, new slight pulmonary vascular prominence with probable posterior pleural effusions.  Plain films of the left foot negative for any acute abnormalities.  EKG done with a paced rhythm.  Patient given a dose of Lasix 80 mg IV x1.  Hospitalist were called to admit the patient for further  evaluation and management.    Assessment & Plan:   Principal Problem:   Acute exacerbation of CHF (congestive heart failure) (HCC) Active Problems:   Pneumatosis of intestines   Nausea & vomiting   Diabetes mellitus, insulin dependent (IDDM), uncontrolled (HCC)   Hyperlipidemia   Obesity, Class III, BMI 40-49.9 (morbid obesity) ([place])   Obstructive sleep apnea   Essential hypertension   Chronic combined systolic and diastolic CHF (congestive heart failure) (HCC)   Thoracic aortic aneurysm (HCC)   Atrial fibrillation/flutter   Debility   S/P placement of cardiac pacemaker   Chronic anticoagulation   CKD (chronic kidney disease), stage III (HCC)   Diabetic ulcer of ankle associated with type 2 diabetes mellitus, limited to breakdown of skin ([place])  1. acute on chronic systolic heart failure exacerbation Patient presented with shortness of breath x1 week, worsening lower extremity edema greater than a week, 8 to 10 pound weight gain over the past 2 weeks.  Patient afebrile.  No productive cough.  Questionable etiology.  May be secondary to medical noncompliance as patient does state he ran out of his furosemide and Crestor for greater than a week.  Per pharmacy tech patient noted to have refills of his furosemide with his medications.    Patient with a urine output of 1 L over the past 24 hours.  Cardiac enzymes negative x3.  TSH of 1.287 on 02/03/2019.    COVID-19 tested in the ED was negative. EKG is ventricularly paced.    2D echo with a  EF of 35 to 40%, moderate left ventricular hypertrophy, moderate pulmonary hypertension.  Patient with a bump in creatinine.  IV Lasix has been transitioned to oral Lasix per cardiology.  Patient resumed on Zaroxolyn and spironolactone and Coreg.  Patient started on a clear liquid diet.  Patient also started per cardiology on isosorbide and hydralazine.  Appreciate cardiology input and recommendations.   2.  Nausea and vomiting/gastric pneumatosis/colonic  ileus Patient noted to have significant nausea and vomiting last night with abdominal distention and tightness and also noted to have a large bowel movement.  Patient with no further emesis this morning and somewhat drowsy. CT abdomen and pelvis which was done with moderately distended stomach with gastric pneumatosis and small volume portal venous gas.  Gas distended proximal small bowel loops without discrete transition point, prior proximal small bowel surgery.  Generalized distention of colon with ileus pattern.  Made n.p.o. and general surgery consulted.  NG tube was recommended however patient refused.  Patient with no further nausea and emesis.  Lactic acid level was within normal limits.  General surgery feels patient likely has an ileus and does not look toxic acting.  General surgery has started patient on a trial of clear liquids and advance as tolerated to a solid diet with bowel regimen.  Per general surgery patient worsens with nausea and emesis may consider NG tube and bowel rest.  General surgery feels patient has severe comorbidities and noted to be DNR and as such not going to be too aggressive.  Per general surgery patient is a very poor operative candidate with his heart failure renal disease, morbid obesity and history of multiple prior surgeries.  Patient started on clears.  Per general surgery.    3.  Diabetes mellitus type 2 Hemoglobin A1c of 8.3.  Patient noted to have a blood glucose level of 68 at 2038hRS (02/04/2019) evening.  CBG of 107 this morning.  Patient noted to have nausea and emesis with some abnormal findings on CT abdomen and pelvis.  Patient started on a diet.  NPH has been discontinued.  Continue sliding scale for now.  Patient placed on the 5 normal saline at [facility].  If CBGs continue to rise we will discontinue D5 normal saline and placed back on home regimen of NPH.  Follow.   4.  Chronic A. fib/a flutter/status post PPM. Patient with nausea and emesis early the  morning of 02/05/2019, with abdominal distention.  Patient with no further nausea or emesis.  Patient started on a diet per general surgery.  Coreg has been resumed per cardiology and IV Lopressor discontinued.  INR therapeutic at 2.5.  Potassium at 3.7.  Coumadin per pharmacy.    5.  Hyperlipidemia Fasting lipid panel with LDL of 49.  Patient with improvement with nausea and vomiting.  Will resume statin as patient has been started on a diet per general surgery.  6.  Chronic left heel wound Seems to be healing.  No drainage noted.  Wound care.  7.  Chronic kidney disease stage III Slight bump in creatinine.  Patient being transitioned from IV Lasix to oral Lasix per cardiology.  Follow.    8.  Obesity  9.  Probable OSA Patient noted to be refusing CPAP during the hospitalization.  Importance of CPAP stressed to patient who agrees to use CPAP.  Will need formal outpatient sleep study.     DVT prophylaxis: Coumadin Code Status: DNR Family Communication: Updated patient.  Disposition Plan: Likely back home with home  health once acute CHF exacerbation is resolved and patient is euvolemic and when okay with cardiology.   Consultants:   Cardiology: Dr. Harrell Gave 02/04/2019  General surgery: Dr. Johney Maine 02/05/2019  Procedures:   2D echo 02/04/2019  CT abdomen and pelvis 02/05/2019  Chest x-Sobiech 02/05/2019    Antimicrobials:   IV Zosyn 02/05/2019   Subjective: Patient noted to be drowsy however patient arouses to verbal stimuli more alert today answering questions appropriately and following commands.  Patient noted to be refusing CPAP overnight.  Patient denies any chest pain.  No shortness of breath.  No nausea, no vomiting, no abdominal pain.    Objective: Vitals:   02/05/19 2048 02/06/19 0404 02/06/19 0427 02/06/19 0553  BP: 121/62 140/74  128/71  Pulse: 69 71  68  Resp: 18 17    Temp: 98.1 F (36.7 C) 98.8 F (37.1 C)    TempSrc:  Oral    SpO2: 99% 99%    Weight:   (!)  137.7 kg   Height:        Intake/Output Summary (Last 24 hours) at 02/06/2019 1011 Last data filed at 02/06/2019 0553 Gross per 24 hour  Intake 100.05 ml  Output 1000 ml  Net -899.95 ml   Filed Weights   02/03/19 1431 02/03/19 1907 02/06/19 0427  Weight: 131.1 kg (!) 142 kg (!) 137.7 kg    Examination:  General exam: Appears calm and comfortable  Respiratory system: Bibasilar crackles with some scattered crackles diffusely.  No rhonchi.  No wheezing.  Bibasilar crackles. Respiratory effort normal. Cardiovascular system: RRR no murmurs rubs or gallops.  No JVD.  1+ bilateral lower extremity edema left greater than right. Gastrointestinal system: Abdomen is soft, distended, some bowel sounds, nontender to palpation.  Central nervous system: Alert and oriented.  Moving extremities spontaneously.  Extremities: Symmetric 5 x 5 power. Skin: No rashes, lesions or ulcers Psychiatry: Unable to assess judgment and insight as patient currently drowsy.      Data Reviewed: I have personally reviewed following labs and imaging studies  CBC: Recent Labs  Lab 02/03/19 1435  WBC 8.5  HGB 13.6  HCT 43.8  MCV 102.1*  PLT 814   Basic Metabolic Panel: Recent Labs  Lab 02/03/19 1435 02/03/19 1530 02/04/19 0220 02/05/19 0502 02/06/19 0513  NA 140  --  142 143 145  K 4.3  --  4.2 3.9 3.7  CL 105  --  105 102 102  CO2 28  --  29 30 33*  GLUCOSE 263*  --  179* 140* 125*  BUN 31*  --  30* 33* 37*  CREATININE 2.33*  --  2.41* 2.49* 2.71*  CALCIUM 8.3*  --  8.2* 8.4* 8.2*  MG  --  1.7 2.0 1.9  --    GFR: Estimated Creatinine Clearance: 38.6 mL/min (A) (by C-G formula based on SCr of 2.71 mg/dL (H)). Liver Function Tests: Recent Labs  Lab 02/03/19 1435  AST 12*  ALT 10  ALKPHOS 33*  BILITOT 0.8  PROT 6.4*  ALBUMIN 2.7*   No results for input(s): LIPASE, AMYLASE in the last 168 hours. No results for input(s): AMMONIA in the last 168 hours. Coagulation Profile: Recent Labs    Lab 02/03/19 1435 02/04/19 0220 02/05/19 0502 02/06/19 0513  INR 2.7* 2.8* 2.6* 2.5*   Cardiac Enzymes: Recent Labs  Lab 02/03/19 1435 02/03/19 2040 02/04/19 0220 02/04/19 0956  TROPONINI <0.03 <0.03 <0.03 <0.03   BNP (last 3 results) No results for input(s): PROBNP  in the last 8760 hours. HbA1C: Recent Labs    02/04/19 0220  HGBA1C 8.3*   CBG: Recent Labs  Lab 02/05/19 2329 02/06/19 0205 02/06/19 0400 02/06/19 0550 02/06/19 0819  GLUCAP 131* 125* 107* 125* 110*   Lipid Profile: Recent Labs    02/04/19 0220  CHOL 84  HDL 26*  LDLCALC 49  TRIG 47  CHOLHDL 3.2   Thyroid Function Tests: Recent Labs    02/03/19 2040  TSH 1.287   Anemia Panel: No results for input(s): VITAMINB12, FOLATE, FERRITIN, TIBC, IRON, RETICCTPCT in the last 72 hours. Sepsis Labs: Recent Labs  Lab 02/05/19 9833  LATICACIDVEN 1.1    Recent Results (from the past 240 hour(s))  SARS Coronavirus 2 [facility] order, Performed in [facility] hospital lab)     Status: None   Collection Time: 02/03/19  3:27 PM  Result Value Ref Range Status   SARS Coronavirus 2 NEGATIVE NEGATIVE Final    Comment: (NOTE) If result is NEGATIVE SARS-CoV-2 target nucleic acids are NOT DETECTED. The SARS-CoV-2 RNA is generally detectable in upper and lower  respiratory specimens during the acute phase of infection. The lowest  concentration of SARS-CoV-2 viral copies this assay can detect is 250  copies / mL. A negative result does not preclude SARS-CoV-2 infection  and should not be used as the sole basis for treatment or other  patient management decisions.  A negative result may occur with  improper specimen collection / handling, submission of specimen other  than nasopharyngeal swab, presence of viral mutation(s) within the  areas targeted by this assay, and inadequate number of viral copies  (<250 copies / mL). A negative result must be combined with clinical  observations, patient history, and  epidemiological information. If result is POSITIVE SARS-CoV-2 target nucleic acids are DETECTED. The SARS-CoV-2 RNA is generally detectable in upper and lower  respiratory specimens dur ing the acute phase of infection.  Positive  results are indicative of active infection with SARS-CoV-2.  Clinical  correlation with patient history and other diagnostic information is  necessary to determine patient infection status.  Positive results do  not rule out bacterial infection or co-infection with other viruses. If result is PRESUMPTIVE POSTIVE SARS-CoV-2 nucleic acids MAY BE PRESENT.   A presumptive positive result was obtained on the submitted specimen  and confirmed on repeat testing.  While 2019 novel coronavirus  (SARS-CoV-2) nucleic acids may be present in the submitted sample  additional confirmatory testing may be necessary for epidemiological  and / or clinical management purposes  to differentiate between  SARS-CoV-2 and other Sarbecovirus currently known to infect humans.  If clinically indicated additional testing with an alternate test  methodology [phone]) is advised. The SARS-CoV-2 RNA is generally  detectable in upper and lower respiratory sp ecimens during the acute  phase of infection. The expected result is Negative. Fact Sheet for Patients:  StrictlyIdeas.no Fact Sheet for Healthcare Providers: BankingDealers.co.za This test is not yet approved or cleared by the [place] FDA and has been authorized for detection and/or diagnosis of SARS-CoV-2 by FDA under an Emergency Use Authorization (EUA).  This EUA will remain in effect (meaning this test can be used) for the duration of the COVID-19 declaration under Section 564(b)(1) of the Act, 21 U.S.C. section 360bbb-3(b)(1), unless the authorization is terminated or revoked sooner. Performed at [facility], [place] [address], [place], [place] [address]     Culture, Urine     Status: Abnormal (Preliminary result)  Collection Time: 02/03/19  6:59 PM  Result Value Ref Range Status   Specimen Description   Final    URINE, CLEAN CATCH Performed at [facility], [place] [address] [place], [place] [address]    Special Requests   Final    NONE Performed at [facility], [place] [address] [place], [place] [address]    Culture >=100,000 COLONIES/mL GRAM NEGATIVE RODS (A)  Final   Report Status PENDING  Incomplete  MRSA PCR Screening     Status: Abnormal   Collection Time: 02/04/19  9:38 AM  Result Value Ref Range Status   MRSA by PCR POSITIVE (A) NEGATIVE Final    Comment:        The GeneXpert MRSA Assay (FDA approved for NASAL specimens only), is one component of a comprehensive MRSA colonization surveillance program. It is not intended to diagnose MRSA infection nor to guide or monitor treatment for MRSA infections. RESULT CALLED TO, READ BACK BY AND VERIFIED WITH: Tyler Aas [id number] @ [address] Performed at [place] [address], [place], [place] [address]          Radiology Studies: Ct Abdomen Pelvis Wo Contrast  Result Date: 02/05/2019 CLINICAL DATA:  Persistent nausea and vomiting. Concern for bowel obstruction EXAM: CT ABDOMEN AND PELVIS WITHOUT CONTRAST TECHNIQUE: Multidetector CT imaging of the abdomen and pelvis was performed following the standard protocol without IV contrast. COMPARISON:  11/10/2017 FINDINGS: Lower chest: Streaky opacities in the lower lobes. Small pleural effusions. Cardiomegaly with biventricular pacer. Hepatobiliary: No focal liver abnormality. Apparent low-density in the posterior right liver on reformats is most likely from motion based on other source images.Cholecystectomy. Small volume portal venous gas seen in the left liver. Pancreas: Atrophy. Spleen: Unremarkable. Adrenals/Urinary Tract: 16 mm right adrenal adenoma. No  hydronephrosis or stone. Unremarkable bladder. Stomach/Bowel: Generalized gaseous distention of colon which also contains stool. No volvulus or rectal impaction. Proximal small bowel loops are gas distended and there are some decompressed small bowel loops in the right upper quadrant, but no discrete transition point and gas is intermittently seen and more distal small bowel period. The stomach is moderately distended and there is fairly extensive gastric wall pneumatosis. Small volume fluid/inflammation is seen around the fundus of the stomach. Vascular/Lymphatic: No acute vascular abnormality. Atherosclerotic calcification. Reproductive:Negative Other: No pneumoperitoneum. Musculoskeletal: Advanced spondylosis with multi-level ankylosis. These results were called by telephone at the time of interpretation on 02/05/2019 at 8:42 am to Dr. Grandville Silos , who verbally acknowledged these results. Please see Z vision dashboard for details of communication. IMPRESSION: 1. Moderately distended stomach with gastric pneumatosis and small volume portal venous gas. This is presumably related patient's history of repetitive vomiting, but please correlate with exam and lactate to exclude necrosis. There is some regional inflammation without pneumoperitoneum. 2. Gas distended proximal small bowel loops without discrete transition point. There has been prior proximal small bowel surgery. 3. Generalized distention of the colon with ileus pattern. Electronically Signed   By: Monte Fantasia M.D.   On: 02/05/2019 08:42   Dg Chest Port 1 View  Result Date: 02/05/2019 CLINICAL DATA:  Vomiting and lethargy EXAM: PORTABLE CHEST 1 VIEW COMPARISON:  02/03/2019 FINDINGS: Chronic cardiomegaly and vascular pedicle widening. There is biventricular pacer leads from the left. Vascular congestion. No Kerley lines, effusion, or pneumothorax. No asymmetric airspace opacity IMPRESSION: Cardiomegaly and vascular congestion similar to recent study.  Electronically Signed   By: Neva Seat.D.  On: 02/05/2019 06:33   Dg Abd Portable 1v  Result Date: 02/05/2019 CLINICAL DATA:  Vomiting EXAM: PORTABLE ABDOMEN - 1 VIEW COMPARISON:  Portable exam 2312 hours compared to CT abdomen and pelvis 11/10/2017 FINDINGS: Pacer leads project over RIGHT atrium and RIGHT ventricle. Air-filled loops of bowel throughout abdomen, likely: An upper normal small bowel loops. Gas present to at least distal sigmoid colon. No definite bowel wall thickening or evidence of obstruction. Bones demineralized. IMPRESSION: Air-filled loops of large and small bowel without definite evidence of obstruction. Electronically Signed   By: Lavonia Dana M.D.   On: 02/05/2019 00:00        Scheduled Meds:  carvedilol  25 mg Oral BID WC   Chlorhexidine Gluconate Cloth  6 each Topical Q0600   chlorpheniramine-HYDROcodone  5 mL Oral Once   furosemide  80 mg Oral Daily   gabapentin  300 mg Oral TID   hydrALAZINE  10 mg Oral TID   insulin aspart  0-5 Units Subcutaneous Q4H   isosorbide dinitrate  5 mg Oral TID   mouth rinse  15 mL Mouth Rinse BID   metolazone  2.5 mg Oral Daily   mupirocin ointment  1 application Nasal BID   pantoprazole (PROTONIX) IV  40 mg Intravenous Q24H   potassium chloride SA  20 mEq Oral BID   sodium chloride flush  3 mL Intravenous Q12H   spironolactone  25 mg Oral BID   warfarin  7.5 mg Oral ONCE-1800   Warfarin - Pharmacist Dosing Inpatient   Does not apply q1800   Continuous Infusions:  sodium chloride     dextrose 5 % and 0.9% NaCl 10 mL/hr at 02/06/19 0943   piperacillin-tazobactam (ZOSYN)  IV 3.375 g (02/06/19 0426)     LOS: 3 days    Time spent: 40 minutes    Irine Seal, MD [facility]  If 7PM-7AM, please contact night-coverage [email or web address] 02/06/2019, 10:11 AM

## 2019-02-06 NOTE — Progress Notes (Signed)
Patients Blood glucose was 74 and patient would not drink anything or take medications. On call provider made aware and dextrose order/given. Patient continues to be lethargic,will awake to voice and answer questions. Will continue to monitor patient closely.

## 2019-02-06 NOTE — Progress Notes (Addendum)
PHARMACY NOTE -  [facility] has been assisting with dosing of Zosyn for IAI.  Dosage remains stable at 3.375 g IV q8 hr and need for further dosage adjustment appears unlikely at present given stable renal function at baseline  Pharmacy will sign off, following peripherally for culture results or dose adjustments. Please reconsult if a change in clinical status warrants re-evaluation of dosage.  Reuel Boom, PharmD, BCPS [phone] 02/06/2019, 10:35 AM

## 2019-02-07 ENCOUNTER — Telehealth: Payer: Self-pay | Admitting: Physician Assistant

## 2019-02-07 ENCOUNTER — Encounter (HOSPITAL_COMMUNITY): Payer: Self-pay | Admitting: Surgery

## 2019-02-07 DIAGNOSIS — N179 Acute kidney failure, unspecified: Secondary | ICD-10-CM

## 2019-02-07 DIAGNOSIS — N184 Chronic kidney disease, stage 4 (severe): Secondary | ICD-10-CM

## 2019-02-07 DIAGNOSIS — Z993 Dependence on wheelchair: Secondary | ICD-10-CM | POA: Insufficient documentation

## 2019-02-07 DIAGNOSIS — Z7901 Long term (current) use of anticoagulants: Secondary | ICD-10-CM | POA: Insufficient documentation

## 2019-02-07 DIAGNOSIS — Z794 Long term (current) use of insulin: Secondary | ICD-10-CM

## 2019-02-07 DIAGNOSIS — E1165 Type 2 diabetes mellitus with hyperglycemia: Secondary | ICD-10-CM

## 2019-02-07 LAB — BASIC METABOLIC PANEL
Anion gap: 11 (ref 5–15)
BUN: 36 mg/dL — ABNORMAL HIGH (ref 8–23)
CO2: 30 mmol/L (ref 22–32)
Calcium: 8 mg/dL — ABNORMAL LOW (ref 8.9–10.3)
Chloride: 95 mmol/L — ABNORMAL LOW (ref 98–111)
Creatinine, Ser: 2.49 mg/dL — ABNORMAL HIGH (ref 0.61–1.24)
GFR calc Af Amer: 30 mL/min — ABNORMAL LOW (ref >60)
GFR calc non Af Amer: 26 mL/min — ABNORMAL LOW (ref >60)
Glucose, Bld: 184 mg/dL — ABNORMAL HIGH (ref 70–99)
Potassium: 3.8 mmol/L (ref 3.5–5.1)
Sodium: 136 mmol/L (ref 135–145)

## 2019-02-07 LAB — GLUCOSE, CAPILLARY
Glucose-Capillary: 170 mg/dL — ABNORMAL HIGH (ref 70–99)
Glucose-Capillary: 174 mg/dL — ABNORMAL HIGH (ref 70–99)
Glucose-Capillary: 251 mg/dL — ABNORMAL HIGH (ref 70–99)
Glucose-Capillary: 271 mg/dL — ABNORMAL HIGH (ref 70–99)
Glucose-Capillary: 314 mg/dL — ABNORMAL HIGH (ref 70–99)

## 2019-02-07 LAB — URINE CULTURE: Culture: >=100000 — AB

## 2019-02-07 LAB — PROTIME-INR
INR: 2.4 — ABNORMAL HIGH (ref 0.8–1.2)
Prothrombin Time: 25.5 seconds — ABNORMAL HIGH (ref 11.4–15.2)

## 2019-02-07 LAB — CBC
HCT: 41.2 % (ref 39.0–52.0)
Hemoglobin: 12.3 g/dL — ABNORMAL LOW (ref 13.0–17.0)
MCH: 31.2 pg (ref 26.0–34.0)
MCHC: 29.9 g/dL — ABNORMAL LOW (ref 30.0–36.0)
MCV: 104.6 fL — ABNORMAL HIGH (ref 80.0–100.0)
Platelets: 195 10*3/uL (ref 150–400)
RBC: 3.94 MIL/uL — ABNORMAL LOW (ref 4.22–5.81)
RDW: 16.7 % — ABNORMAL HIGH (ref 11.5–15.5)
WBC: 11.5 10*3/uL — ABNORMAL HIGH (ref 4.0–10.5)
nRBC: 0 % (ref 0.0–0.2)

## 2019-02-07 MED ORDER — WARFARIN SODIUM 5 MG PO TABS
5.0000 mg | ORAL_TABLET | Freq: Once | ORAL | Status: AC
  Administered 2019-02-07: 17:00:00 5 mg via ORAL
  Filled 2019-02-07: qty 1

## 2019-02-07 MED ORDER — BISACODYL 10 MG RE SUPP: 10.0000 mg | Freq: Two times a day (BID) | RECTAL | Status: DC | PRN

## 2019-02-07 MED ORDER — SACCHAROMYCES BOULARDII 250 MG PO CAPS
250.0000 mg | ORAL_CAPSULE | Freq: Two times a day (BID) | ORAL | Status: DC
  Administered 2019-02-07 – 2019-02-12 (×11): 250 mg via ORAL
  Filled 2019-02-07 (×11): qty 1

## 2019-02-07 MED ORDER — PSYLLIUM 95 % PO PACK
1.0000 | PACK | Freq: Two times a day (BID) | ORAL | Status: DC
  Administered 2019-02-07 – 2019-02-12 (×11): 1 via ORAL
  Filled 2019-02-07 (×11): qty 1

## 2019-02-07 MED ORDER — INSULIN NPH (HUMAN) (ISOPHANE) 100 UNIT/ML ~~LOC~~ SUSP
5.0000 [IU] | Freq: Every day | SUBCUTANEOUS | Status: DC
  Administered 2019-02-07: 5 [IU] via SUBCUTANEOUS
  Filled 2019-02-07: qty 10

## 2019-02-07 NOTE — Progress Notes (Signed)
[place] for warfarin Indication: Afib/flutter  No Known Allergies  Patient Measurements: Height: 5\' 11"  (180.3 cm) Weight: (!) 306 lb 14.1 oz (139.2 kg) IBW/kg (Calculated) : 75.3  Vital Signs: Temp: 98.3 F (36.8 C) (05/03 0448) BP: 138/76 (05/03 1017) Pulse Rate: 70 (05/03 0817)  Labs: Recent Labs    02/05/19 0502 02/06/19 0513 02/07/19 0548  HGB  --   --  12.3*  HCT  --   --  41.2  PLT  --   --  195  LABPROT 27.3* 26.5* 25.5*  INR 2.6* 2.5* 2.4*  CREATININE 2.49* 2.71* 2.49*    Estimated Creatinine Clearance: 42.2 mL/min (A) (by C-G formula based on SCr of 2.49 mg/dL (H)).   Medications:  Medications Prior to Admission  Medication Sig Dispense Refill Last Dose  . carvedilol (COREG) 25 MG tablet Take 25 mg by mouth 2 (two) times daily.  3 Past Month at Unknown time  . cyclobenzaprine (FLEXERIL) 5 MG tablet Take 5 mg by mouth 3 (three) times daily.   Past Month at Unknown time  . furosemide (LASIX) 80 MG tablet Take 80 mg by mouth daily.    Past Month at Unknown time  . gabapentin (NEURONTIN) 300 MG capsule Take 300 mg by mouth 3 (three) times daily.    Past Month at Unknown time  . insulin NPH Human (HUMULIN N,NOVOLIN N) 100 UNIT/ML injection Inject 20 units in the morning and 20 units in the evening.   Past Month at Unknown time  . insulin regular (NOVOLIN R,HUMULIN R) 100 units/mL injection Use with sliding scale.   Past Month at Unknown time  . KLOR-CON M20 20 MEQ tablet Take 20 mg by mouth 2 (two) times daily.  3 Past Month at Unknown time  . metolazone (ZAROXOLYN) 2.5 MG tablet Take 2.5 mg by mouth daily.   Past Month at Unknown time  . rosuvastatin (CRESTOR) 10 MG tablet Take 10 mg by mouth daily.   Past Month at Unknown time  . traZODone (DESYREL) 50 MG tablet Take 100 mg by mouth at bedtime as needed for sleep.   3 Past Month at Unknown time  . warfarin (COUMADIN) 7.5 MG tablet Take 7.5 mg by mouth daily.   02/03/2019 at  Unknown time  . Adhesive Tape (ADHESIVE PAPER) TAPE To use with pads/dressing. 100 each 5   . Adhesive Tape TAPE To use with dressings/pads. 100 each 5   . Blood Glucose Monitoring Suppl (ACCU-CHEK AVIVA) device Use as instructed 1 each 0 Taking  . Disposable Gloves MISC To use with wound dressing/changing. 100 each 5   . Gauze Bandages MISC To use daily. 100 each 5   . Gauze Pads & Dressings (ABDOMINAL PAD) 8"X10" PADS 1 each by Does not apply route as needed. 100 each 5   . Gauze Pads & Dressings (AMD FOAM DRESSING) 4"X4" PADS To use daily. 100 each 5   . Gauze Pads & Dressings (KERLIX BANDAGE ROLL) MISC To use daily. 100 each 5   . Skin Protectants, Misc. (EUCERIN) cream Apply topically as needed for wound care. 397 g 5   . spironolactone (ALDACTONE) 25 MG tablet Take 1 tablet (25 mg total) by mouth 2 (two) times daily. (Patient not taking: Reported on 02/03/2019) 30 tablet 0 Not Taking at Unknown time   Scheduled:  . carvedilol  25 mg Oral BID WC  . Chlorhexidine Gluconate Cloth  6 each Topical Q0600  . chlorpheniramine-HYDROcodone  5 mL  Oral Once  . furosemide  80 mg Oral Daily  . gabapentin  300 mg Oral TID  . hydrALAZINE  10 mg Oral TID  . insulin aspart  0-5 Units Subcutaneous Q4H  . isosorbide dinitrate  5 mg Oral TID  . mouth rinse  15 mL Mouth Rinse BID  . metolazone  2.5 mg Oral Daily  . mupirocin ointment  1 application Nasal BID  . pantoprazole (PROTONIX) IV  40 mg Intravenous Q24H  . potassium chloride SA  20 mEq Oral BID  . psyllium  1 packet Oral BID  . rosuvastatin  10 mg Oral Daily  . saccharomyces boulardii  250 mg Oral BID  . sodium chloride flush  3 mL Intravenous Q12H  . spironolactone  25 mg Oral BID  . Warfarin - Pharmacist Dosing Inpatient   Does not apply q1800   PRN: sodium chloride, acetaminophen, bisacodyl, hydrocerin, ondansetron (ZOFRAN) IV, sodium chloride flush, traZODone  Assessment: 53 yoM with PMH sCHF, Afib/flutter on warfarin, AAA, CKD3,  complete HB s/p pacer, DM2, admitted for AECHF. Pharmacy to continue warfarin.   Baseline INR 2.7  Prior anticoagulation: warfarin 7.5 mg daily, LD 4/29 - some confusion about last dose since patient has history of noncompliance with other meds, is currently altered and changed his story during the admission. However, given INR 2.7, d/w TRH MD and felt reasonably confident that dose was taken today  Significant events: 5/1 unable to tolerate POs and missed warfarin dose. Provider opted not to convert to IV warfarin at that time  Today, 02/07/2019:  INR remains therapeutic; only slightly lower after missing warfarin yesterday  CBC: Hgb slightly low, Plt WNL  Major drug interactions: none currently  No bleeding issues per notes  Advancing to dysphagia 1 diet today, no current plans for surgery  Goal of Therapy: INR 2-3  Plan:  Warfarin 5 mg again today; advancing diet so likely can resume 7.5 mg tomorrow if eating well and INR trending appropriately  Daily INR  CBC at least q72 hr while on warfarin - due 5/2  Monitor for signs of bleeding or thrombosis   Reuel Boom, PharmD, BCPS [phone] 02/07/2019, 10:55 AM

## 2019-02-07 NOTE — Telephone Encounter (Signed)
   TELEPHONE CALL NOTE  This patient has been deemed a candidate for follow-up tele-health visit to limit community exposure during the Covid-19 pandemic. I spoke with the patient via phone to discuss instructions. This has been outlined on the patient's AVS (dotphrase: hcevisitinfo). The patient was advised to review the section on consent for treatment as well. The patient will receive a phone call 2-3 days prior to their E-Visit at which time consent will be verbally confirmed.   A Virtual Office Visit appointment type has been scheduled, with "VIDEO" or "TELEPHONE" in the appointment notes - patient prefers telephone type.  I have either confirmed the patient is active in MyChart or offered to send sign-up link to phone/email via Mychart icon beside patient's photo.  [place], [place] 02/07/2019 8:55 AM

## 2019-02-07 NOTE — Discharge Instructions (Signed)
Low-Sodium Nutrition Therapy Eating less sodium can help you if you have high blood pressure, heart failure, or kidney or liver disease. Your body needs a little sodium, but too much sodium can cause your body to hold onto extra water. This extra water will raise your blood pressure and can cause damage to your heart, kidneys, or liver as they are forced to work harder. Sometimes you can see how the extra fluid affects you because your hands, legs, or belly swell.  You may also hold water around your heart and lungs, which makes it hard to breathe. Even if you take medication for blood pressure or a water pill (diuretic) to remove fluid, it is still important to have less salt in your diet. Check with your primary care provider before drinking alcohol since it may affect the amount of fluid in your body and how your heart, kidneys, or liver work.  Sodium in Food A low-sodium meal plan limits the sodium that you get from food and beverages to 1,500-2,000 milligrams (mg) per day.  Salt is the main source of sodium.  Read the nutrition label on the package to find out how much sodium is in one serving of a food. Select foods with 140 milligrams (mg) of sodium or less per serving. You may be able to eat one or two servings of foods with a little more than 140 milligrams (mg) of sodium if you are closely watching how much sodium you eat in a day. Check the serving size on the label. The amount of sodium listed on the label shows the amount in one serving of the food.  So, if you eat more than one serving, you will get more sodium than the amount listed.  Cutting Back on Sodium Eat more fresh foods. Fresh fruits and vegetables are low in sodium, as well as frozen vegetables and fruits that have no added juices or sauces. Fresh meats are lower in sodium than processed meats, such as bacon, sausage, and hotdogs. Not all processed foods are unhealthy, but some processed foods may have too much  sodium. Eat less salt at the table and when cooking. One of the ingredients in salt is sodium. One teaspoon of table salt has 2,300 milligrams of sodium. Leave the salt out of recipes for pasta, casseroles, and soups. Be a Paramedic. Food packages that say Salt-free, sodium-free, very low sodium, and low sodium have less than 140 milligrams of sodium per serving. Beware of products identified as Unsalted, No Salt Added, Reduced Sodium, or Lower Sodium. These items may still be high in sodium. You should always check the nutrition label. Add flavors to your food without adding sodium. Try lemon juice, lime juice, or vinegar. Dry or fresh herbs add flavor. Buy a sodium-free seasoning blend or make your own at home. You can purchase salt-free or sodium-free condiments like barbeque sauce in stores and online.   Eating in Restaurants Choose foods carefully when you eat outside your home. Restaurant foods can be very high in sodium.  Many restaurants provide nutrition facts on their menus or their websites. If you cannot find that information, ask your server.  Let your server know that you want your food to be cooked without salt and that you would like your salad dressing and sauces to be served on the side.  Foods Recommended Grains Bread, bagels, rolls without salted tops Homemade bread made with reduced- sodium baking powder Cold cereals, especially shredded wheat and puffed rice Oats, grits, or  cream of wheat Pastas, quinoa, and rice Popcorn, pretzels or crackers without salt Corn tortillas  Protein Foods Fresh meats and fish; [place] bacon (check the nutrition labels - make sure they are not packaged in a sodium solution) Canned or packed tuna (no mo re than 4 ounces at 1 serving) Beans and peas Soybeans) and t ofu Eggs Nuts or nut butters without salt  Dairy Milk or milk powder Plant milks, such as rice and soy Yogurt, including Greek yogurt Small  amounts of natural chee se (blocks of cheese) or reduced-sodium cheese can be used in moderation. (Swiss, ricotta, and fresh mozzarella cheese are lower in sodium than the others) Cream Cheese Low sodium cottage cheese  Vegetables Fresh and frozen vegetables without added sauces or salt Homemade soups (without salt) Low-sodium, salt-free or sodium- free canned vegetables and soups  Fruit  Fresh and canned fruits Dried fruits, such as rais ins, cranberries, and prunes  Oils  Tub or liquid margarine, regular or without salt [place]ola, corn, peanut, olive, safflower, or sunflower oils  Condiments Fresh or dried herbs such as basil, bay leaf, dill, mustard (dry), nutmeg, paprika, parsley, rosemary, sage, or thyme. Low sodium ketchup Vinegar Lemon or lime juice Pepper, red pepper flakes, and cayenne. Hot sauce contains sodium, but if you [place] e just a drop or two, it will not add up to much. Salt-free or sodium-free seasoning mixes and marinades Simple salad dressings: vinegar and oil  Foods Not Recommended Grains Breads or crackers topped with salt Cereals (hot/cold) with more than 3 00 mg sodium per serving Biscuits, cornbread, and other quick breads prepared with b aking soda Pre-packaged bread crumbs Seasoned and packaged rice and pasta mixes Self-rising flours  Protein Foods Cured meats: Bacon, ham, sausage, pepperoni and hot dogs Canned meats (chili, vienna sausage, or sardines) Smoked fish and meats Frozen meals that have more than 600 mg of sodium per serving Egg substitute (with added sodium)  Dairy Buttermilk Processed cheese spreads Cottage cheese (1 cup may have over 500 mg of sodium; look for low-sodium.) American or feta cheese Shredded Cheese has m ore sodium than blocks of cheese String cheese  Vegetables Canned vegetables (unless they are salt-free, sodium-free or low sodium) Frozen vegetables with seasoning and sauces Sauerkraut and pickled  vegetables Canned or dried soups (unless they are salt-free, sodium-free, or low sodium) [place] fries and onion rings  Fruit  Dried fruits preserved with additives that have sodium  Oils  Salted butter or margarine, all types of olives  Condiments Salt, sea salt, kosher salt, onion salt, and garlic salt Seasoning mixes with salt Bouillon cubes Ketchup Barbequ e sauce and Worcestershire sauce unless low sodium Soy sauce Salsa, pick les, olives, relish Salad dressings: ranch, blu e cheese, [place], and [place].  Low Sodium Sample 1-Day Menu Breakfast 1 cup cooked oatmeal 1 slice whole wheat bread toast 1 tablespoon peanut butter without salt 1 banana 1 cup 1% milk Lunch Tacos made with: 2 corn tortillas  cup black beans, low sodium  cup roasted or grilled chicken (without skin)  avocado Squeeze of lime juice 1 cup salad greens 1 tablespoon low-sodium salad dressing  cup strawberries 1 orange Afternoon Snack 1/3 cup grapes 6 ounces yogurt Evening Meal 3 ounces herb-baked fish 1 baked potato 2 teaspoons olive oil  cup cooked carrots 2 thick slices tomatoes on: 2 lettuce leaves 1 teaspoon olive oil 1 teaspoon balsamic vinegar 1 cup 1% milk Evening Snack 1 apple  cup almonds without salt  Sodium-Free Flavoring Tips  When cooking, the following items may be used for flavoring instead of salt or seasonings that contain sodium. Remember: A little bit of spice goes a long way! Be careful not to overseason. Spice Blend Recipe (makes about ? cup) 5 teaspoons onion powder 2 teaspoons garlic powder 2 teaspoons paprika 2 teaspoon dry mustard 1 teaspoon crushed thyme leaves  teaspoon white pepper  teaspoon celery seed  Food Item Flavorings Beef  Basil, bay leaf, caraway, curry, dill, dry mustard, garlic, grape jelly, green pepper, mace, marjoram, mushrooms (fresh), nutmeg, onion or onion powder, parsley, pepper, rosemary, sage  Chicken  Basil,  cloves, cranberries, mace, mushrooms (fresh), nutmeg, oregano, paprika, parsley, pineapple, saffron, sage, savory, tarragon, thyme, tomato, turmeric  Egg  Chervil, curry, dill, dry mustard, garlic or garlic powder, green pepper, jelly, mushrooms (fresh), nutmeg, onion powder, paprika, parsley, rosemary, tarragon, tomato  Fish  Basil, bay leaf, chervil, curry, dill, dry mustard, green pepper, lemon juice, marjoram, mushrooms (fresh), paprika, pepper, tarragon, tomato, turmeric  Lamb  Cloves, curry, dill, garlic or garlic powder, mace, mint, mint jelly, onion, oregano, parsley, pineapple, rosemary,tarragon, thyme  Pork  Applesauce, basil, caraway, chives, cloves, garlic or garlic powder, onion or onion powder, rosemary, thyme  Veal  Apricots, basil, bay leaf, currant jelly, curry, ginger, marjoram, mushrooms (fresh), oregano, paprika  Vegetables  Basil, dill, garlic or garlic powder, ginger, lemon juice, mace, marjoram, nutmeg, onion or onion powder, tarragon, tomato, sugar or sugar substitute, salt-free salad dressing, vinegar  Desserts  Allspice, anise, cinnamon, cloves, ginger, mace, nutmeg, vanilla extract, other extracts    Sodium (Salt) Content of Foods Eating more than the serving size for a moderate or low-sodium food will make it a high-sodium food. Foods made with high-sodium foods will also be high in sodium. Unless otherwise noted, all foods are cooked: meat is roasted, fish is cooked with dry heat, and vegetables are cooked from fresh; fruit is raw. This is a guide. Actual values may vary depending on product and/or processing.  Canned and processed foods may have a higher sodium content. Values are rounded to the nearest 5-milligram (mg) increment and may be averaged with similar foods in the group.  High Sodium (more than 300 mg) Food Serving Milligrams (mg) Bacon 2 slices 382 Bagel, 4?: egg 1 each 450 Bagel, 4?: plain, onion, or seeded 1 each 400 Barbecue sauce 2  Tbsp 350 Beans, baked, plain  cup 435 Beans, garbanzo  cup 360 Beans, kidney, canned  cup 440 Beans, lima, canned  cup 405 Beans, white, canned  cup 445 Beef, dried 1 oz. 790 Biscuit, 2? 1 each 350 Catsup 2 Tbsp 335 Cheese, American 1 oz 400 Cheese, cottage  cup 460 Cheese, feta 1 oz 315 Corn, creamed, canned  cup 365 Croissant 2 oz 425 Fish, salmon, canned 3 oz 470 Fish, salmon, smoked 3 oz 670 Fish, sardines, canned 3 oz 430 Frankfurter, beef or pork 1 each 510 Ham 3 oz 1,125 Lobster 3 oz 325 Miso  cup 1,280 Mushrooms, canned  cup 330 Pickle, dill 1 large 570 Potatoes, au gratin or scalloped  cup 500 Pretzels 1 oz 400 Pudding, instant, chocolate, prepared with milk  cup 420 Salad dressing, [place], commercial 2 Tbsp 485 Salami, dry or hard 1 oz 600 Salt, table 1 tsp 2,325 Sauerkraut, canned  cup 780 Soup, canned 1 cup 700-1,000 Soy sauce 1 Tbsp 900 Spinach, canned, drained  cup 345 Teriyaki sauce 1 Tbsp 690 Tomato or vegetable juice, canned  cup  325 Tomato sauce, canned  cup 640 Tomato sauce, spaghetti or marinara  cup 510 Vegetable or soy patty 1 each 380  Moderate Sodium (140-300 mg) Food Serving Milligrams (mg) Asparagus, canned 4 spears 205 Beans, green or yellow, canned  cup 175 Beets, canned  cup 160 Bologna, pork and beef 1 oz 210 Bread, pita, 4? 1 each 150 Bread, pumpernickel or rye 1 slice 476 Bread, white 1 slice 546 Carrots, canned  cup 175 Cereal, raisin bran  cup 175 Cheese: [place], mozzarella, cheddar 1 oz 175 Cheese, Parmesan 2 Tbsp 150 Cheese, provolone, part-skim 1 oz 250 Cheese, ricotta  cup 155 Corn, canned  cup 285 Crab, canned 3 oz 240 English muffin 1 each 250 French fries 10 fries 200 Greens, beet  cup 175 Milk, buttermilk 1 cup 260 Milk, chocolate 1 cup 165 Milkshake 8 oz 240 Muffin 2 oz 250 Nuts, mixed, salted 1 oz 190 Olives, ripe, canned 5 large 190 Pancake or waffle, 4? 1 each 240 Peanuts, salted  1 oz 230 Peas, green, canned  cup 215 Potato chips 1 oz 190 Potatoes, mashed, prepared from dry mix  cup 170 Pudding, ready-to-eat  cup 160 Pudding, vanilla, from mix  cup 225 Roll, hot dog or hamburger 1 each 205 Salad dressing 2 Tbsp 200-300 Salsa 2 Tbsp 195 Sausage, pork 1 oz 200 Tomatoes, canned  cup 170 Tomatoes, stewed, canned  cup 280 Tortilla, flour, 6? 1 each 205 Tuna, canned in water 3 oz 290 Yogurt, plain or fruited 8 oz 100-175  Low Sodium (less than 140 mg) Food Serving Milligrams (mg) Bread, Italian 1 slice 503 Bread, wheat 1 slice 546 Butter, salted 1 Tbsp 90 Cereal, breakfast: corn, bran, or wheat  cup 100-150 Cheese, Swiss 1 oz 55 Egg substitute, liquid  cup 120 Egg, whole 1 large 70 Fish: pollock, swordfish, perch, cod, halibut, roughy, salmon 3 oz 60-100 Frozen yogurt  cup 65 Gelatin, prepared from mix  cup 100 Ice cream  cup 55 Margarine, regular 1 Tbsp 135 Milk, all types 1 cup 100 Milk, evaporated, canned  cup 135 Mustard 1 tsp 55 Peanut butter 1 Tbsp 75 Peas, green, frozen  cup 60 Seeds, sunflower 1 oz 115 Soy milk 1 cup 125 Spinach  cup 65 Spinach, frozen  cup 90 Sweet potato, baked in skin 1 medium 40 [place], light or dark meat 3 oz 60 Yogurt, plain or fruited 8 oz 100-175  Very Low Sodium (less than 35 mg) Food Serving Milligrams (mg) Apricots, canned  cup 5 Beef, ground 1 oz. 20 Beer, regular 12 oz 15 Broccoli  cup 30 Broccoli, raw  cup 15 Brussels sprouts  cup 15 Cabbage, raw or cooked  cup 5 Carbonated beverages 12 oz 20-40 Cauliflower  cup 10 Cauliflower, raw  cup 15 Dried beans and peas  cup 5-20 Greens: beet, collard, mustard  cup 10-20 Honeydew  cup 30 Lettuce, leaf 1 cup 15 Noodles  cup 10 Oatmeal  cup 5 Peaches, canned  cup 5 Pears, canned  cup 5 Pork 1 oz 25 Potato, baked with skin 1 medium 20 Rice, brown or wild  cup 5 Sherbet  cup 35 Soybeans  cup 15   YOUR CARDIOLOGY TEAM HAS  ARRANGED FOR AN E-VISIT FOR YOUR APPOINTMENT - PLEASE REVIEW IMPORTANT INFORMATION BELOW SEVERAL DAYS PRIOR TO YOUR APPOINTMENT  Due to the recent COVID-19 pandemic, we are transitioning in-person office visits to tele-medicine visits in an effort to decrease unnecessary exposure to our patients, their families, and staff. These  visits are billed to your insurance just like a normal visit is. We also encourage you to sign up for MyChart if you have not already done so. You will need a smartphone if possible. For patients that do not have this, we can still complete the visit using a regular telephone but do prefer a smartphone to enable video when possible. You may have a family member that lives with you that can help. If possible, we also ask that you have a blood pressure cuff and scale at home to measure your blood pressure, heart rate and weight prior to your scheduled appointment. Patients with clinical needs that need an in-person evaluation and testing will still be able to come to the office if absolutely necessary. If you have any questions, feel free to call our office.     YOUR PROVIDER WILL BE USING THE FOLLOWING PLATFORM TO COMPLETE YOUR VISIT: Telephone    2-3 DAYS BEFORE YOUR APPOINTMENT  You will receive a telephone call from one of our [facility] team members - your caller ID may say "Unknown caller." If this is a video visit, we will walk you through how to get the video launched on your phone. We will remind you check your blood pressure, heart rate and weight prior to your scheduled appointment. If you have an Apple Watch or Kardia, please upload any pertinent ECG strips the day before or morning of your appointment to [facility]. Our staff will also make sure you have reviewed the consent and agree to move forward with your scheduled tele-health visit.     THE DAY OF YOUR APPOINTMENT  Approximately 15 minutes prior to your scheduled appointment, you will receive a telephone call  from one of [facility] team - your caller ID may say "Unknown caller."  Our staff will confirm medications, vital signs for the day and any symptoms you may be experiencing. Please have this information available prior to the time of visit start. It may also be helpful for you to have a pad of paper and pen handy for any instructions given during your visit. They will also walk you through joining the smartphone meeting if this is a video visit.    CONSENT FOR TELE-HEALTH VISIT - PLEASE REVIEW  I hereby voluntarily request, consent and authorize [facility] and its employed or contracted physicians, physician assistants, nurse practitioners or other licensed health care professionals (the Practitioner), to provide me with telemedicine health care services (the Services") as deemed necessary by the treating Practitioner. I acknowledge and consent to receive the Services by the Practitioner via telemedicine. I understand that the telemedicine visit will involve communicating with the Practitioner through live audiovisual communication technology and the disclosure of certain medical information by electronic transmission. I acknowledge that I have been given the opportunity to request an in-person assessment or other available alternative prior to the telemedicine visit and am voluntarily participating in the telemedicine visit.  I understand that I have the right to withhold or withdraw my consent to the use of telemedicine in the course of my care at any time, without affecting my right to future care or treatment, and that the Practitioner or I may terminate the telemedicine visit at any time. I understand that I have the right to inspect all information obtained and/or recorded in the course of the telemedicine visit and may receive copies of available information for a reasonable fee.  I understand that some of the potential risks of receiving the Services via telemedicine include:  Delay or  interruption in medical evaluation due to technological equipment failure or disruption;  Information transmitted may not be sufficient (e.g. poor resolution of images) to allow for appropriate medical decision making by the Practitioner; and/or   In rare instances, security protocols could fail, causing a breach of personal health information.  Furthermore, I acknowledge that it is my responsibility to provide information about my medical history, conditions and care that is complete and accurate to the best of my ability. I acknowledge that Practitioner's advice, recommendations, and/or decision may be based on factors not within their control, such as incomplete or inaccurate data provided by me or distortions of diagnostic images or specimens that may result from electronic transmissions. I understand that the practice of medicine is not an exact science and that Practitioner makes no warranties or guarantees regarding treatment outcomes. I acknowledge that I will receive a copy of this consent concurrently upon execution via email to the email address I last provided but may also request a printed copy by calling the office of [facility].    I understand that my insurance will be billed for this visit.   I have read or had this consent read to me.  I understand the contents of this consent, which adequately explains the benefits and risks of the Services being provided via telemedicine.   I have been provided ample opportunity to ask questions regarding this consent and the Services and have had my questions answered to my satisfaction.  I give my informed consent for the services to be provided through the use of telemedicine in my medical care  By participating in this telemedicine visit I agree to the above.

## 2019-02-07 NOTE — Progress Notes (Signed)
OT Cancellation Note  Patient Details Name: Kurt Cross MRN: [id number] DOB: Nov 18, 1950   Cancelled Treatment:    Reason Eval/Treat Not Completed: Other (comment). Checked on pt twice. When he was eating, he was agreeable to getting up to chair after breakfast. Now he states that arm hurts too much from being stuck.  May need SNF, if he doesn't have 24/7. Will check back next week.  Cecily Lawhorne 02/07/2019, 10:05 AM  [name], OTR/L Acute Rehabilitation Services [phone] [facility] pager [phone] office 02/07/2019

## 2019-02-07 NOTE — Progress Notes (Addendum)
PROGRESS NOTE    Kurt Cross  JME:[id number]  DOB: August 26, 1951  DOA: 02/03/2019 PCP: [name], MD  Brief Narrative:  68 y.o.malewith medical history significant ofchronic combined systolic/diastolic heart failure, A. fib/a flutter, AAA, chronic kidney disease stage III, complete heart block status post PPM, chronic constipation, type 2 diabetes, chronic heel wound presented to the ED with a one-week history of worsening shortness of breath, worsening lower extremity edema and 8-10 pound weight gain. He missed 1 weeks worth of his Crestor and furosemide stating he ran out of his medications. Patient also reported  vomiting a week PTA , decreased oral intake and some insomnia. Patient seen in the ED, comprehensive metabolic profile with a glucose of 263, BUN of 31, creatinine of 2.33, albumin of 2.7 otherwise was within normal limits. BNP of 194.2. Troponin was negative. CBC unremarkable. INR of 2.7. COVID-19 was negative. Chest x-Appleman done with chronic cardiomegaly, new slight pulmonary vascular prominence with probable posterior pleural effusions. Patient recieved a dose of Lasix 80 mg IV x1 and admitted to hospitalist service with cardiology consultation. Hospital course complicated by recurrent GI symptoms and w/u revealing gastric pneumatosis/colonic ileus. GS following.    Subjective:Patient appears somnolent. He states he is now open for "Checking out my belly".Seen by GS and advancing diet. He denies any diarrhea today and tolerated liquid diet for breakfast.  Noted to be on 4lits [place] . Not on home 02 at baseline.  Objective: Vitals:   02/07/19 0520 02/07/19 0817 02/07/19 1017 02/07/19 1105  BP:  128/76 138/76   Pulse:  70    Resp:      Temp:      TempSrc:      SpO2:    96%  Weight: (!) 139.2 kg     Height:        Intake/Output Summary (Last 24 hours) at 02/07/2019 1321 Last data filed at 02/07/2019 1237 Gross per 24 hour  Intake 824.75 ml  Output 1850 ml  Net  -1025.25 ml   Filed Weights   02/03/19 1907 02/06/19 0427 02/07/19 0520  Weight: (!) 142 kg (!) 137.7 kg (!) 139.2 kg    Physical Examination:  General exam: Appears calm and comfortable  Respiratory system: Clear to auscultation but decreased BS at bases. Respiratory effort normal. Cardiovascular system: S1 & S2 heard, RRR. No JVD, murmurs, rubs, gallops or clicks. No pedal edema. Gastrointestinal system: Abdomen is obese, nondistended, soft and nontender. No organomegaly or masses felt. Normal bowel sounds heard. Central nervous system: Alert and oriented. No focal neurological deficits. Extremities: Symmetric 5 x 5 power. Skin: No rashes, lesions or ulcers Psychiatry: Judgement and insight appear normal. Mood & affect appropriate.     Data Reviewed: I have personally reviewed following labs and imaging studies  CBC: Recent Labs  Lab 02/03/19 1435 02/07/19 0548  WBC 8.5 11.5*  HGB 13.6 12.3*  HCT 43.8 41.2  MCV 102.1* 104.6*  PLT 210 229   Basic Metabolic Panel: Recent Labs  Lab 02/03/19 1435 02/03/19 1530 02/04/19 0220 02/05/19 0502 02/06/19 0513 02/06/19 1709 02/07/19 0548  NA 140  --  142 143 145  --  136  K 4.3  --  4.2 3.9 3.7  --  3.8  CL 105  --  105 102 102  --  95*  CO2 28  --  29 30 33*  --  30  GLUCOSE 263*  --  179* 140* 125*  --  184*  BUN 31*  --  30* 33* 37*  --  36*  CREATININE 2.33*  --  2.41* 2.49* 2.71*  --  2.49*  CALCIUM 8.3*  --  8.2* 8.4* 8.2*  --  8.0*  MG  --  1.7 2.0 1.9  --  1.7  --    GFR: Estimated Creatinine Clearance: 42.2 mL/min (A) (by C-G formula based on SCr of 2.49 mg/dL (H)). Liver Function Tests: Recent Labs  Lab 02/03/19 1435  AST 12*  ALT 10  ALKPHOS 33*  BILITOT 0.8  PROT 6.4*  ALBUMIN 2.7*   No results for input(s): LIPASE, AMYLASE in the last 168 hours. Recent Labs  Lab 02/06/19 1059  AMMONIA 19   Coagulation Profile: Recent Labs  Lab 02/03/19 1435 02/04/19 0220 02/05/19 0502 02/06/19 0513  02/07/19 0548  INR 2.7* 2.8* 2.6* 2.5* 2.4*   Cardiac Enzymes: Recent Labs  Lab 02/03/19 1435 02/03/19 2040 02/04/19 0220 02/04/19 0956  TROPONINI <0.03 <0.03 <0.03 <0.03   BNP (last 3 results) No results for input(s): PROBNP in the last 8760 hours. HbA1C: No results for input(s): HGBA1C in the last 72 hours. CBG: Recent Labs  Lab 02/06/19 2005 02/06/19 2354 02/07/19 0445 02/07/19 0744 02/07/19 1205  GLUCAP 241* 202* 170* 174* 251*   Lipid Profile: No results for input(s): CHOL, HDL, LDLCALC, TRIG, CHOLHDL, LDLDIRECT in the last 72 hours. Thyroid Function Tests: No results for input(s): TSH, T4TOTAL, FREET4, T3FREE, THYROIDAB in the last 72 hours. Anemia Panel: No results for input(s): VITAMINB12, FOLATE, FERRITIN, TIBC, IRON, RETICCTPCT in the last 72 hours. Sepsis Labs: Recent Labs  Lab 02/05/19 2703  LATICACIDVEN 1.1    Recent Results (from the past 240 hour(s))  SARS Coronavirus 2 [name] order, Performed in [facility] hospital lab)     Status: None   Collection Time: 02/03/19  3:27 PM  Result Value Ref Range Status   SARS Coronavirus 2 NEGATIVE NEGATIVE Final    Comment: (NOTE) If result is NEGATIVE SARS-CoV-2 target nucleic acids are NOT DETECTED. The SARS-CoV-2 RNA is generally detectable in upper and lower  respiratory specimens during the acute phase of infection. The lowest  concentration of SARS-CoV-2 viral copies this assay can detect is 250  copies / mL. A negative result does not preclude SARS-CoV-2 infection  and should not be used as the sole basis for treatment or other  patient management decisions.  A negative result may occur with  improper specimen collection / handling, submission of specimen other  than nasopharyngeal swab, presence of viral mutation(s) within the  areas targeted by this assay, and inadequate number of viral copies  (<250 copies / mL). A negative result must be combined with clinical  observations, patient history,  and epidemiological information. If result is POSITIVE SARS-CoV-2 target nucleic acids are DETECTED. The SARS-CoV-2 RNA is generally detectable in upper and lower  respiratory specimens dur ing the acute phase of infection.  Positive  results are indicative of active infection with SARS-CoV-2.  Clinical  correlation with patient history and other diagnostic information is  necessary to determine patient infection status.  Positive results do  not rule out bacterial infection or co-infection with other viruses. If result is PRESUMPTIVE POSTIVE SARS-CoV-2 nucleic acids MAY BE PRESENT.   A presumptive positive result was obtained on the submitted specimen  and confirmed on repeat testing.  While 2019 novel coronavirus  (SARS-CoV-2) nucleic acids may be present in the submitted sample  additional confirmatory testing may be necessary for epidemiological  and / or clinical  management purposes  to differentiate between  SARS-CoV-2 and other Sarbecovirus currently known to infect humans.  If clinically indicated additional testing with an alternate test  methodology [phone]) is advised. The SARS-CoV-2 RNA is generally  detectable in upper and lower respiratory sp ecimens during the acute  phase of infection. The expected result is Negative. Fact Sheet for Patients:  StrictlyIdeas.no Fact Sheet for Healthcare Providers: BankingDealers.co.za This test is not yet approved or cleared by the [place] FDA and has been authorized for detection and/or diagnosis of SARS-CoV-2 by FDA under an Emergency Use Authorization (EUA).  This EUA will remain in effect (meaning this test can be used) for the duration of the COVID-19 declaration under Section 564(b)(1) of the Act, 21 U.S.C. section 360bbb-3(b)(1), unless the authorization is terminated or revoked sooner. Performed at [facility], [place] [address], [place], [place] [address]    Culture, Urine     Status: Abnormal   Collection Time: 02/03/19  6:59 PM  Result Value Ref Range Status   Specimen Description   Final    URINE, CLEAN CATCH Performed at [facility], [place] [address] [place], [place] [address]    Special Requests   Final    NONE Performed at [facility], [place] [address] [place], [place] [address]    Culture >=100,000 COLONIES/mL PROTEUS MIRABILIS (A)  Final   Report Status 02/07/2019 FINAL  Final   Organism ID, Bacteria PROTEUS MIRABILIS (A)  Final      Susceptibility   Proteus mirabilis - MIC*    AMPICILLIN <=2 SENSITIVE Sensitive     CEFAZOLIN <=4 SENSITIVE Sensitive     CEFTRIAXONE <=1 SENSITIVE Sensitive     CIPROFLOXACIN >=4 RESISTANT Resistant     GENTAMICIN <=1 SENSITIVE Sensitive     IMIPENEM 4 SENSITIVE Sensitive     NITROFURANTOIN 128 RESISTANT Resistant     TRIMETH/SULFA <=20 SENSITIVE Sensitive     AMPICILLIN/SULBACTAM <=2 SENSITIVE Sensitive     PIP/TAZO <=4 SENSITIVE Sensitive     * >=100,000 COLONIES/mL PROTEUS MIRABILIS  MRSA PCR Screening     Status: Abnormal   Collection Time: 02/04/19  9:38 AM  Result Value Ref Range Status   MRSA by PCR POSITIVE (A) NEGATIVE Final    Comment:        The GeneXpert MRSA Assay (FDA approved for NASAL specimens only), is one component of a comprehensive MRSA colonization surveillance program. It is not intended to diagnose MRSA infection nor to guide or monitor treatment for MRSA infections. RESULT CALLED TO, READ BACK BY AND VERIFIED WITH: Tyler Aas [id number] @ [address] [place] Performed at [facility] [address] [place], [place] [address]       Radiology Studies: No results found.      Scheduled Meds: . carvedilol  25 mg Oral BID WC  . Chlorhexidine Gluconate Cloth  6 each Topical Q0600  . chlorpheniramine-HYDROcodone  5 mL Oral Once  . furosemide  80 mg Oral Daily  . gabapentin  300 mg Oral TID  .  hydrALAZINE  10 mg Oral TID  . insulin aspart  0-5 Units Subcutaneous Q4H  . isosorbide dinitrate  5 mg Oral TID  . mouth rinse  15 mL Mouth Rinse BID  . metolazone  2.5 mg Oral Daily  . mupirocin ointment  1 application Nasal BID  . pantoprazole (PROTONIX) IV  40 mg Intravenous Q24H  . potassium chloride SA  20 mEq Oral  BID  . psyllium  1 packet Oral BID  . rosuvastatin  10 mg Oral Daily  . saccharomyces boulardii  250 mg Oral BID  . sodium chloride flush  3 mL Intravenous Q12H  . spironolactone  25 mg Oral BID  . warfarin  5 mg Oral ONCE-1800  . Warfarin - Pharmacist Dosing Inpatient   Does not apply q1800   Continuous Infusions: . sodium chloride    . dextrose 5 % and 0.9% NaCl 10 mL/hr at 02/06/19 0943  . piperacillin-tazobactam (ZOSYN)  IV 3.375 g (02/07/19 1237)    Assessment & Plan:    1. Acute on chronic combined systolic/diastolic CHF with acute hypoxic respiratory failure POA: Echo 4/30 showed EF 35-40%, Mod Conc LVH, Mod Pulm HTN and elevated RV systolic pressure. Seen by cardiology. Diuretics transitioned to oral lasix/metaloxone and Aldactone on 5/2. Tolerating well. On KVO intravenous fluids/IV abx TID. Tolerating diet. I/Os show net negative. Monitor creatinine. Per cardiology, renal function limits ACEi/ARB use. Continue carvedilol 25 mg BID and Isordil initiated by Cards today. Outpatient follow up being arranged by cardiology. Taper o2 as tolerated.  2. Acute on CKD stage 3: cardiorenal syndrome in the setting diuretics and variable oral intake. Cr 2.49 today (from 2.33 on admission, peak 2.71). Monitor on diuretics. Not on ACEI . If his creatinine continues to rise chronically, may also need to cut back on spironolactone.  3. Gastric pneumatosis/Nausea/vomiting: Seen by GS and feel gastric pneumatosis is related to the severe retching. He doesn't need NG tube now and not a candidate for aggressive surgical interventions per GS note. Certainly is not acting toxic.   Lactic acid normal.  No evidence of shock or peritonitis.White count likely reactive. Afebrile.   Advance diet today per GS. D/C antibiotics and monitor.  4. Chronic A. fib/a flutter/ complete heart block status post PPM: IV lopressor transitioned to oral Coreg. INR therapeutic on coumadin.   5. Diabetes mellitus type 2 : Hemoglobin A1c of 8.3. D/C dextrose fluids. Continue SSI and resume NPH as diet tolerance/BG level rise.   6. Hyperlipidemia: Statins  7. Sleep apnea: non compliant with CPAP at baseline.   8. Chronic left heel wound: continue local wound care  DVT prophylaxis: Coumadin Code Status: DNR Family / Patient Communication: D/W patient Disposition Plan: Home when medically clear     LOS: 4 days    Time spent: 35 minutes    Guilford Shi, MD [facility] Pager [phone]  If 7PM-7AM, please contact night-coverage [email or web address] Password [email or web address] 02/07/2019, 1:21 PM

## 2019-02-07 NOTE — Progress Notes (Addendum)
Kurt Cross [id number] 10/26/1950  CARE TEAM:  PCP: [name], MD  Outpatient Care Team: Patient Care Team: [name], MD as PCP - General (Family Medicine) Dorothy Spark, MD as PCP - Cardiology (Cardiology) Evans Lance, MD as PCP - Electrophysiology (Cardiology) Charolette Forward, MD as Consulting Physician (Cardiology) [name], MD as Consulting Physician (Pulmonary Disease) Michael Boston, MD as Consulting Physician (General Surgery) Sherilyn Banker, MD as Consulting Physician (Surgery)  Inpatient Treatment Team: Treatment Team: Attending Provider: Guilford Shi, MD; Rounding Team: Joycelyn Das, MD; Consulting Physician: Lbcardiology, Michae Kava, MD; Technician: Demaris Callander, NT; Technician: Angie Fava, NT; Consulting Physician: Nolon Nations, MD; Registered Nurse: Lavona Mound, RN; Registered Nurse: Danie Chandler, RN; Occupational Therapist: [name], OT; Registered Nurse: Worth, Glenna Durand, RN; Registered Nurse: Bertrum Sol, RN; Technician: Faith Rogue T, NT   Problem List:   Principal Problem:   Acute exacerbation of CHF (congestive heart failure) (HCC) Active Problems:   Obesity, Class III, BMI 40-49.9 (morbid obesity) ([place])   Diabetes mellitus, insulin dependent (IDDM), uncontrolled ([place])   Hyperlipidemia   Obstructive sleep apnea   Essential hypertension   Chronic combined systolic and diastolic CHF (congestive heart failure) (HCC)   Thoracic aortic aneurysm (HCC)   Atrial fibrillation/flutter   Debility   S/P placement of cardiac pacemaker   Chronic anticoagulation   CKD (chronic kidney disease) stage 4, GFR 15-29 ml/min (HCC)   Diabetic ulcer of ankle associated with type 2 diabetes mellitus, limited to breakdown of skin ([place])   Pneumatosis of gastric wall   Nausea & vomiting      * No surgery found *      Assessment  Ileus of uncertain etiology in a patient with multiple  medical problems  Questionable gastric pneumatosis but without any evidence of peritonitis, shock, or decline  [facility] Stay = 4 days)  Plan:  There is no evidence of any peritonitis or shock at this time.  He has no longer has any emesis.  Perhaps the gastric pneumatosis is related to the severe retching his radiology implies.  Certainly is not acting toxic.  Lactic acid normal.  No evidence of shock or peritonitis.  Tolerating clear liquids.  Advance to dysphagia 1/full liquid diet.  And advance to heart healthy diet as tolerated.    If has worsening nausea or emesis again, reconsider nasogastric tube and bowel rest.  He refuses nasogastric tube, compromising our ability to more accurately treat and assess him.  However, he has severe comorbidities and already is a DNR, so I am not going to be too aggressive with him.  He does not seem to have an abdominal catastrophe.  He is a very poor operative candidate with his heart failure and renal disease and morbid obesity and hostile abdomen from numerous prior surgeries.  He does not want to have anymore surgery anyway.  -Management of his cardiac and renal issues through primary service and other specialist. -VTE prophylaxis- SCDs, etc -mobilize as tolerated to help recovery  20 minutes spent in review, evaluation, examination, counseling, and coordination of care.  More than 50% of that time was spent in counseling.  02/07/2019    Subjective: (Chief complaint)  Alert and looks back to normal.  He recalls multiple surgeries in [place] in the past few years.  He thinks it through [place] system with a male Psychologist, sport and exercise but cannot tell me more than that.  I can find no records of  this.  Tolerating liquids.  No nausea or vomiting.  No abdominal pain.  Objective:  Vital signs:  Vitals:   02/06/19 1527 02/06/19 2009 02/07/19 0448 02/07/19 0520  BP: (!) 144/75 133/74 138/78   Pulse: 69 69 70   Resp: 16 20 20    Temp: 98.5 F (36.9 C)  98 F (36.7 C) 98.3 F (36.8 C)   TempSrc: Oral     SpO2: 95% 98% 98%   Weight:    (!) 139.2 kg  Height:        Last BM Date: 02/04/19  Intake/Output   Yesterday:  05/02 0701 - 05/03 0700 In: 777.6 [P.O.:360; I.V.:189; IV Piggyback:228.6] Out: 1850 [Urine:1850] This shift:  No intake/output data recorded.  Bowel function:  Flatus: YES  BM:  YES  Drain: (No drain)   Physical Exam:  General: Pt awake alert and oriented x3.  Following commands.  Does not have a good recollection of his past medical history. Eyes: PERRL, normal EOM.  Sclera clear.  No icterus Neuro: CN II-XII intact w/o focal sensory/motor deficits. Lymph: No head/neck/groin lymphadenopathy Psych:  No delerium/psychosis/paranoia HENT: Normocephalic, Mucus membranes moist.  No thrush Neck: Supple, No tracheal deviation Chest: No chest wall pain w good excursion CV:  Pulses intact.  Regular rhythm MS: Normal AROM mjr joints.  No obvious deformity  Abdomen: Soft.morbidly obese  Mildy distended.  Nontender.  No fistula, ostomy, wound, or scar.  No evidence of peritonitis.  No incarcerated hernias.  Ext:  No deformity.  No mjr edema.  No cyanosis Skin: No petechiae / purpura  Results:   Labs: Results for orders placed or performed during the hospital encounter of 02/03/19 (from the past 48 hour(s))  Glucose, capillary     Status: Abnormal   Collection Time: 02/05/19  7:52 AM  Result Value Ref Range   Glucose-Capillary 130 (H) 70 - 99 mg/dL  Lactic acid, plasma     Status: None   Collection Time: 02/05/19  9:37 AM  Result Value Ref Range   Lactic Acid, Venous 1.1 0.5 - 1.9 mmol/L    Comment: Performed at [facility], [place] [address] [place], [place] [address]  Glucose, capillary     Status: Abnormal   Collection Time: 02/05/19 11:55 AM  Result Value Ref Range   Glucose-Capillary 109 (H) 70 - 99 mg/dL  Glucose, capillary     Status: None   Collection Time: 02/05/19  4:33 PM    Result Value Ref Range   Glucose-Capillary 73 70 - 99 mg/dL  Glucose, capillary     Status: Abnormal   Collection Time: 02/05/19  6:33 PM  Result Value Ref Range   Glucose-Capillary 62 (L) 70 - 99 mg/dL  Glucose, capillary     Status: Abnormal   Collection Time: 02/05/19  7:03 PM  Result Value Ref Range   Glucose-Capillary 113 (H) 70 - 99 mg/dL  Glucose, capillary     Status: None   Collection Time: 02/05/19  8:45 PM  Result Value Ref Range   Glucose-Capillary 74 70 - 99 mg/dL  Glucose, capillary     Status: Abnormal   Collection Time: 02/05/19  9:52 PM  Result Value Ref Range   Glucose-Capillary 149 (H) 70 - 99 mg/dL  Glucose, capillary     Status: Abnormal   Collection Time: 02/05/19 11:29 PM  Result Value Ref Range   Glucose-Capillary 131 (H) 70 - 99 mg/dL  Glucose, capillary     Status: Abnormal  Collection Time: 02/06/19  2:05 AM  Result Value Ref Range   Glucose-Capillary 125 (H) 70 - 99 mg/dL  Glucose, capillary     Status: Abnormal   Collection Time: 02/06/19  4:00 AM  Result Value Ref Range   Glucose-Capillary 107 (H) 70 - 99 mg/dL  Basic metabolic panel     Status: Abnormal   Collection Time: 02/06/19  5:13 AM  Result Value Ref Range   Sodium 145 135 - 145 mmol/L   Potassium 3.7 3.5 - 5.1 mmol/L   Chloride 102 98 - 111 mmol/L   CO2 33 (H) 22 - 32 mmol/L   Glucose, Bld 125 (H) 70 - 99 mg/dL   BUN 37 (H) 8 - 23 mg/dL   Creatinine, Ser 2.71 (H) 0.61 - 1.24 mg/dL   Calcium 8.2 (L) 8.9 - 10.3 mg/dL   GFR calc non Af Amer 24 (L) >60 mL/min   GFR calc Af Amer 27 (L) >60 mL/min   Anion gap 10 5 - 15    Comment: Performed at [facility], [place] [address], [place], [place] [address]  Protime-INR     Status: Abnormal   Collection Time: 02/06/19  5:13 AM  Result Value Ref Range   Prothrombin Time 26.5 (H) 11.4 - 15.2 seconds   INR 2.5 (H) 0.8 - 1.2    Comment: (NOTE) INR goal varies based on device and disease states. Performed at [facility], [place] [address], [place], [place] [address]   Glucose, capillary     Status: Abnormal   Collection Time: 02/06/19  5:50 AM  Result Value Ref Range   Glucose-Capillary 125 (H) 70 - 99 mg/dL  Glucose, capillary     Status: Abnormal   Collection Time: 02/06/19  8:19 AM  Result Value Ref Range   Glucose-Capillary 110 (H) 70 - 99 mg/dL   Comment 1 Notify RN   Ammonia     Status: None   Collection Time: 02/06/19 10:59 AM  Result Value Ref Range   Ammonia 19 9 - 35 umol/L    Comment: Performed at [facility], [place] [address] [place], [place] [address]  Glucose, capillary     Status: Abnormal   Collection Time: 02/06/19 11:29 AM  Result Value Ref Range   Glucose-Capillary 119 (H) 70 - 99 mg/dL   Comment 1 Notify RN   Magnesium     Status: None   Collection Time: 02/06/19  5:09 PM  Result Value Ref Range   Magnesium 1.7 1.7 - 2.4 mg/dL    Comment: Performed at [facility], [place] [address], [place], [place] [address]  Glucose, capillary     Status: Abnormal   Collection Time: 02/06/19  5:48 PM  Result Value Ref Range   Glucose-Capillary 218 (H) 70 - 99 mg/dL   Comment 1 Notify RN   Glucose, capillary     Status: Abnormal   Collection Time: 02/06/19  8:05 PM  Result Value Ref Range   Glucose-Capillary 241 (H) 70 - 99 mg/dL  Glucose, capillary     Status: Abnormal   Collection Time: 02/06/19 11:54 PM  Result Value Ref Range   Glucose-Capillary 202 (H) 70 - 99 mg/dL  Glucose, capillary     Status: Abnormal   Collection Time: 02/07/19  4:45 AM  Result Value Ref Range   Glucose-Capillary 170 (H) 70 - 99 mg/dL  Protime-INR     Status: Abnormal   Collection Time: 02/07/19  5:48 AM  Result Value Ref Range   Prothrombin Time 25.5 (H) 11.4 - 15.2 seconds   INR 2.4 (H) 0.8 - 1.2    Comment: (NOTE) INR goal varies based on device and disease states. Performed at [facility], [place] [address] [place],  [place] [address]   CBC     Status: Abnormal   Collection Time: 02/07/19  5:48 AM  Result Value Ref Range   WBC 11.5 (H) 4.0 - 10.5 K/uL   RBC 3.94 (L) 4.22 - 5.81 MIL/uL   Hemoglobin 12.3 (L) 13.0 - 17.0 g/dL   HCT 41.2 39.0 - 52.0 %   MCV 104.6 (H) 80.0 - 100.0 fL   MCH 31.2 26.0 - 34.0 pg   MCHC 29.9 (L) 30.0 - 36.0 g/dL   RDW 16.7 (H) 11.5 - 15.5 %   Platelets 195 150 - 400 K/uL   nRBC 0.0 0.0 - 0.2 %    Comment: Performed at [facility], [place] [address], [place], [place] [address]    Imaging / Studies: Ct Abdomen Pelvis Wo Contrast  Result Date: 02/05/2019 CLINICAL DATA:  Persistent nausea and vomiting. Concern for bowel obstruction EXAM: CT ABDOMEN AND PELVIS WITHOUT CONTRAST TECHNIQUE: Multidetector CT imaging of the abdomen and pelvis was performed following the standard protocol without IV contrast. COMPARISON:  11/10/2017 FINDINGS: Lower chest: Streaky opacities in the lower lobes. Small pleural effusions. Cardiomegaly with biventricular pacer. Hepatobiliary: No focal liver abnormality. Apparent low-density in the posterior right liver on reformats is most likely from motion based on other source images.Cholecystectomy. Small volume portal venous gas seen in the left liver. Pancreas: Atrophy. Spleen: Unremarkable. Adrenals/Urinary Tract: 16 mm right adrenal adenoma. No hydronephrosis or stone. Unremarkable bladder. Stomach/Bowel: Generalized gaseous distention of colon which also contains stool. No volvulus or rectal impaction. Proximal small bowel loops are gas distended and there are some decompressed small bowel loops in the right upper quadrant, but no discrete transition point and gas is intermittently seen and more distal small bowel period. The stomach is moderately distended and there is fairly extensive gastric wall pneumatosis. Small volume fluid/inflammation is seen around the fundus of the stomach. Vascular/Lymphatic: No acute vascular abnormality. Atherosclerotic  calcification. Reproductive:Negative Other: No pneumoperitoneum. Musculoskeletal: Advanced spondylosis with multi-level ankylosis. These results were called by telephone at the time of interpretation on 02/05/2019 at 8:42 am to Dr. Grandville Silos , who verbally acknowledged these results. Please see Z vision dashboard for details of communication. IMPRESSION: 1. Moderately distended stomach with gastric pneumatosis and small volume portal venous gas. This is presumably related patient's history of repetitive vomiting, but please correlate with exam and lactate to exclude necrosis. There is some regional inflammation without pneumoperitoneum. 2. Gas distended proximal small bowel loops without discrete transition point. There has been prior proximal small bowel surgery. 3. Generalized distention of the colon with ileus pattern. Electronically Signed   By: Monte Fantasia M.D.   On: 02/05/2019 08:42    Medications / Allergies: per chart  Antibiotics: Anti-infectives (From admission, onward)   Start     Dose/Rate Route Frequency Ordered Stop   02/05/19 1200  piperacillin-tazobactam (ZOSYN) IVPB 3.375 g     3.375 g 12.5 mL/hr over 240 Minutes Intravenous Every 8 hours 02/05/19 0950          Note: Portions of this report may have been transcribed using voice recognition software. Every effort was made to ensure accuracy; however, inadvertent computerized transcription errors may be present.  Any transcriptional errors that result from this process are unintentional.     [name], MD, FACS, MASCRS Gastrointestinal and Minimally Invasive Surgery    [address], [place], [place] [address] [phone] Main / Paging [phone] Fax

## 2019-02-07 NOTE — Progress Notes (Signed)
Progress Note  Patient Name: Kurt Cross Date of Encounter: 02/07/2019  Primary Cardiologist: Ena Dawley, MD  Subjective   He is the most interactive I have seen him since admission. He is sitting up in bed eating breakfast, asks me to pick up jello he dropped and open his ginger ale. Denies any chest pain or shortness of breath, but notes that his legs still having swelling. No other complaints this AM.  Inpatient Medications    Scheduled Meds: . carvedilol  25 mg Oral BID WC  . Chlorhexidine Gluconate Cloth  6 each Topical Q0600  . chlorpheniramine-HYDROcodone  5 mL Oral Once  . furosemide  80 mg Oral Daily  . gabapentin  300 mg Oral TID  . hydrALAZINE  10 mg Oral TID  . insulin aspart  0-5 Units Subcutaneous Q4H  . isosorbide dinitrate  5 mg Oral TID  . mouth rinse  15 mL Mouth Rinse BID  . metolazone  2.5 mg Oral Daily  . mupirocin ointment  1 application Nasal BID  . pantoprazole (PROTONIX) IV  40 mg Intravenous Q24H  . potassium chloride SA  20 mEq Oral BID  . psyllium  1 packet Oral BID  . rosuvastatin  10 mg Oral Daily  . saccharomyces boulardii  250 mg Oral BID  . sodium chloride flush  3 mL Intravenous Q12H  . spironolactone  25 mg Oral BID  . Warfarin - Pharmacist Dosing Inpatient   Does not apply q1800   Continuous Infusions: . sodium chloride    . dextrose 5 % and 0.9% NaCl 10 mL/hr at 02/06/19 0943  . piperacillin-tazobactam (ZOSYN)  IV 3.375 g (02/07/19 0400)   PRN Meds: sodium chloride, acetaminophen, bisacodyl, hydrocerin, ondansetron (ZOFRAN) IV, sodium chloride flush, traZODone   Vital Signs    Vitals:   02/06/19 2009 02/07/19 0448 02/07/19 0520 02/07/19 0817  BP: 133/74 138/78  128/76  Pulse: 69 70  70  Resp: 20 20    Temp: 98 F (36.7 C) 98.3 F (36.8 C)    TempSrc:      SpO2: 98% 98%    Weight:   (!) 139.2 kg   Height:        Intake/Output Summary (Last 24 hours) at 02/07/2019 0900 Last data filed at 02/07/2019 0518 Gross per 24  hour  Intake 777.63 ml  Output 1850 ml  Net -1072.37 ml   Last 3 Weights 02/07/2019 02/06/2019 02/03/2019  Weight (lbs) 306 lb 14.1 oz 303 lb 9.2 oz 313 lb 0.9 oz  Weight (kg) 139.2 kg 137.7 kg 142 kg      Telemetry    Atrial flutter with V pacing - Personally Reviewed  ECG    No new - Personally Reviewed  Physical Exam   GEN: No acute distress.   Neck: JVD difficult to assess due to body habitus, does appear to have wave just above clavicle sitting up Cardiac: RRR, no murmurs, rubs, or gallops.  Respiratory: Clear to auscultation bilaterally in anterolateral fields, cannot lean forward or roll for me today GI: Soft, nontender, non-distended  MS: trace bilateral LE edema at shins, 1+ at ankles; LE wound without apparent drainage Neuro:  Nonfocal  Psych: More alert and responsive today  Labs    Chemistry Recent Labs  Lab 02/03/19 1435  02/05/19 0502 02/06/19 0513 02/07/19 0548  NA 140   < > 143 145 136  K 4.3   < > 3.9 3.7 3.8  CL 105   < >  102 102 95*  CO2 28   < > 30 33* 30  GLUCOSE 263*   < > 140* 125* 184*  BUN 31*   < > 33* 37* 36*  CREATININE 2.33*   < > 2.49* 2.71* 2.49*  CALCIUM 8.3*   < > 8.4* 8.2* 8.0*  PROT 6.4*  --   --   --   --   ALBUMIN 2.7*  --   --   --   --   AST 12*  --   --   --   --   ALT 10  --   --   --   --   ALKPHOS 33*  --   --   --   --   BILITOT 0.8  --   --   --   --   GFRNONAA 28*   < > 26* 24* 26*  GFRAA 33*   < > 30* 27* 30*  ANIONGAP 7   < > 11 10 11    < > = values in this interval not displayed.     Hematology Recent Labs  Lab 02/03/19 1435 02/07/19 0548  WBC 8.5 11.5*  RBC 4.29 3.94*  HGB 13.6 12.3*  HCT 43.8 41.2  MCV 102.1* 104.6*  MCH 31.7 31.2  MCHC 31.1 29.9*  RDW 17.2* 16.7*  PLT 210 195    Cardiac Enzymes Recent Labs  Lab 02/03/19 1435 02/03/19 2040 02/04/19 0220 02/04/19 0956  TROPONINI <0.03 <0.03 <0.03 <0.03   No results for input(s): TROPIPOC in the last 168 hours.   BNP Recent Labs  Lab  02/03/19 1435  BNP 194.2*     DDimer No results for input(s): DDIMER in the last 168 hours.   Radiology    No results found.  Cardiac Studies   Echo personally reviewed, from 02/04/19:  1. The left ventricle has moderately reduced systolic function, with an ejection fraction of 35-40%. The cavity size was normal. There is moderate concentric left ventricular hypertrophy. Left ventricular diastolic Doppler parameters are consistent with pseudonormalization.  2. The right ventricle was not assessed. The cavity was mildly enlarged. Right ventricular systolic pressure is moderately elevated with an estimated pressure of 44.6 mmHg.  3. The mitral valve is grossly normal.  4. The tricuspid valve is grossly normal. Tricuspid valve regurgitation is mild-moderate.  5. Mild thickening of the aortic valve. Aortic valve regurgitation is mild by color flow Doppler. No stenosis of the aortic valve.  6. There is moderate dilatation of the ascending aorta and of the aortic root.  7. Pulmonary hypertension is moderate.  8. The inferior vena cava was dilated in size with <50% respiratory variability.   Patient Profile     68 y.o. male with PMH non-obstructive CAD, permanent atrial fibrillation on coumadin, complete heart block s/p PPM with upgrade to CRT-P 0258, chronic systolic and diastolic heart failure, OSA, HTN, HLD, type II diabetes, CKD stage 3 bordering on 4 who presented with acute on chronic systolic heart failure, likely in the setting of medication nonadherence. We are following at the request of Dr. Grandville Silos for management of heart failure.  Assessment & Plan    Acute on chronic systolic and diastolic heart failure: SOB, orthopnea, weight gain, hypoxia in the setting of at least 1 month per patient of not using lasix at home -echo done yesterday with EF 35-40%, similar to prior, with mildly elevated filling pressures and grade 2 DD -Admission weight 142 kg (though on same day, has a  weight  charted as 131 kg). Today weighed at 139.2, which is up, but he is further net negative? Per I/O, net negative 4.7 L (likely more given that first several days not well tracked).  -Cr 2.49 today (from 2.33 on admission, peak 2.71) -With him being more alert today, I tried to reinforce importance of med adherence, heart failure education, including daily weights, salt avoidance, fluid restriction, activity recommendations. He wasn't able to teach back to me on these items. Plan is for him to go home per primary team, and he will need home assistance with this. -Was on lasix 80 mg daily, metolazone 2.5 mg daily, and spironolactone 25 mg BID at home. Transitioned to oral diuretics yesterday, creatinine improved -renal function limits ACEi/ARB/ARNI use. If his creatinine continues to rise chronically, may also need to cut back on spironolactone.  -on carvedilol 25 mg BID, continue -his renal function is borderline, but with his diabetes and heart failure, could consider SGLT2 inhibitor. Will defer to endo/medicine on this, but my concern would be his renal function and the amount of diuretics he is on concomitantly would complicate use of this. -I do not think he will be able to do hydralazine/isordil TID post discharge. Per Dr. Francesca Oman note in 2017, he had issues with this in the past. I would like to start him at a low dose in the hospital, and if he goes to a facility, he may be able to do this dosing short term, but at home I suspect best we can hope for is BID. Would benefit from med assistance at home. Started isordil 5-hydralazine 10 mg TID, tolerated well, BP today limits uptitration. -encouraged CPAP, declining this while inpatient  Afib, pacemaker dependent with CRT-P: continue coumadin, followed by Dr. Rayann Heman.  HLD: continue statin HTN: isordil/hydralazine as above type II diabetes per primary team.  TAA history 2014: Cannot have repeat CTA for TAA given his currently kidney function. On  echo, measured at 4.6 cm. Defer routine follow up for TAA to PCP as outpatient as he is not currently symptomatic.  [facility] will sign off.   Medication Recommendations:  Continue current regimen of carvedilol 25 mg BID, furosemide 80 mg daily, metolazone 2.5 mg daily, rosuvastatin 10 mg daily, spironolactone 25 mg BID, warfarin per pharmacy, and note addition of isosorbide 5 mg TID with hydralazine 10 mg TID (was not on at home) Other recommendations (labs, testing, etc):  BMET within a week Follow up as an outpatient:  We will arrange follow up with Dr. Francesca Oman office  TIME SPENT WITH PATIENT: 25 minutes of direct patient care. More than 50% of that time was spent on coordination of care and counseling regarding HF management.  Buford Dresser, MD, PhD [facility]   For questions or updates, please contact [place] Please consult [email or web address] for contact info under     Signed, Buford Dresser, MD  02/07/2019, 9:00 AM

## 2019-02-08 LAB — CBC
HCT: 39.6 % (ref 39.0–52.0)
Hemoglobin: 12.3 g/dL — ABNORMAL LOW (ref 13.0–17.0)
MCH: 31.2 pg (ref 26.0–34.0)
MCHC: 31.1 g/dL (ref 30.0–36.0)
MCV: 100.5 fL — ABNORMAL HIGH (ref 80.0–100.0)
Platelets: 200 10*3/uL (ref 150–400)
RBC: 3.94 MIL/uL — ABNORMAL LOW (ref 4.22–5.81)
RDW: 16.2 % — ABNORMAL HIGH (ref 11.5–15.5)
WBC: 13.3 10*3/uL — ABNORMAL HIGH (ref 4.0–10.5)
nRBC: 0 % (ref 0.0–0.2)

## 2019-02-08 LAB — BASIC METABOLIC PANEL
Anion gap: 9 (ref 5–15)
BUN: 34 mg/dL — ABNORMAL HIGH (ref 8–23)
CO2: 35 mmol/L — ABNORMAL HIGH (ref 22–32)
Calcium: 8.5 mg/dL — ABNORMAL LOW (ref 8.9–10.3)
Chloride: 92 mmol/L — ABNORMAL LOW (ref 98–111)
Creatinine, Ser: 2.26 mg/dL — ABNORMAL HIGH (ref 0.61–1.24)
GFR calc Af Amer: 34 mL/min — ABNORMAL LOW (ref >60)
GFR calc non Af Amer: 29 mL/min — ABNORMAL LOW (ref >60)
Glucose, Bld: 208 mg/dL — ABNORMAL HIGH (ref 70–99)
Potassium: 3.6 mmol/L (ref 3.5–5.1)
Sodium: 136 mmol/L (ref 135–145)

## 2019-02-08 LAB — GLUCOSE, CAPILLARY
Glucose-Capillary: 155 mg/dL — ABNORMAL HIGH (ref 70–99)
Glucose-Capillary: 206 mg/dL — ABNORMAL HIGH (ref 70–99)
Glucose-Capillary: 212 mg/dL — ABNORMAL HIGH (ref 70–99)
Glucose-Capillary: 226 mg/dL — ABNORMAL HIGH (ref 70–99)
Glucose-Capillary: 272 mg/dL — ABNORMAL HIGH (ref 70–99)
Glucose-Capillary: 342 mg/dL — ABNORMAL HIGH (ref 70–99)

## 2019-02-08 LAB — PROTIME-INR
INR: 2.6 — ABNORMAL HIGH (ref 0.8–1.2)
Prothrombin Time: 27.3 seconds — ABNORMAL HIGH (ref 11.4–15.2)

## 2019-02-08 MED ORDER — PANTOPRAZOLE SODIUM 40 MG PO TBEC
40.0000 mg | DELAYED_RELEASE_TABLET | Freq: Every day | ORAL | Status: DC
  Administered 2019-02-09 – 2019-02-12 (×4): 40 mg via ORAL
  Filled 2019-02-08 (×4): qty 1

## 2019-02-08 MED ORDER — WARFARIN SODIUM 5 MG PO TABS
7.5000 mg | ORAL_TABLET | Freq: Every day | ORAL | Status: DC
  Administered 2019-02-08 – 2019-02-11 (×4): 7.5 mg via ORAL
  Filled 2019-02-08 (×4): qty 1

## 2019-02-08 MED ORDER — INSULIN NPH (HUMAN) (ISOPHANE) 100 UNIT/ML ~~LOC~~ SUSP
10.0000 [IU] | Freq: Two times a day (BID) | SUBCUTANEOUS | Status: DC
  Administered 2019-02-08 – 2019-02-12 (×9): 10 [IU] via SUBCUTANEOUS
  Filled 2019-02-08: qty 10

## 2019-02-08 MED FILL — Perflutren Lipid Microsphere IV Susp 1.1 MG/ML: INTRAVENOUS | Qty: 10 | Status: AC

## 2019-02-08 NOTE — Progress Notes (Signed)
Physical Therapy Treatment Patient Details Name: Kurt Cross MRN: [id number] DOB: 12/05/1950 Today's Date: 02/08/2019    History of Present Illness 68 year old man was admitted to ED on 4/29 with SOB/CHF exacerbation.  PMH:  AAA, CKD, Heart block with pacemaker, DM, and chronic L heel    PT Comments    Pt somewhat lethargic this session, pt stating he has not been up all weekend. Pt required max to total assist +2 for all mobility, and could not assist PT and PT aide at all with lateral scoot on transfer board or with scooting posteriorly once in recliner. PT updated follow-up recommendation to reflect SNF level of care, as pt has declined with mobility status since last visit. MD and case manager notified. PT to continue to follow. Lift pad placed for return to bed for RN staff.  Sats on 2LO2 - 85-93%   Follow Up Recommendations  Supervision/Assistance - 24 hour;SNF     Equipment Recommendations  None recommended by PT    Recommendations for Other Services       Precautions / Restrictions Precautions Precautions: Fall;ICD/Pacemaker Restrictions Weight Bearing Restrictions: No    Mobility  Bed Mobility Overal bed mobility: Needs Assistance Bed Mobility: Supine to Sit Rolling: +2 for physical assistance;+2 for safety/equipment;Max assist   Supine to sit: +2 for physical assistance;+2 for safety/equipment;Max assist     General bed mobility comments: Max assist +2 for rolling and coming to sitting position on EOB for completion of roll, hand placement on bed rail, LE management, and trunk elevation off of bed. Pt with difficulty maintaining sitting balance this session, mod assist for upright sitting as pt with tendency to lean posteriorly. Sat EOB 5 minutes, requiring min-mod assist periodically to correct balance.   Transfers Overall transfer level: Needs assistance Equipment used: Sliding board;2 person hand held assist Transfers: Lateral/Scoot Transfers           Lateral/Scoot Transfers: Total assist;+2 physical assistance;+2 safety/equipment;With slide board General transfer comment: Total assist +2 for translation on transfer board, sitting balance on board, hand placement, and scooting pt back in chair. PT and PT aide also had pt do small boosts with bari steady x3 to translate pt hips back, and place pad/lift pad under pt.    Ambulation/Gait                 Stairs             Wheelchair Mobility    Modified Rankin (Stroke Patients Only)       Balance Overall balance assessment: Needs assistance Sitting-balance support: Bilateral upper extremity supported;Feet supported Sitting balance-Leahy Scale: Poor Sitting balance - Comments: required min-mod assist to maintain sitting balance, reaching for environment for steadying often and pt states "I am scared (to fall)" sitting EOB.  Postural control: Posterior lean Standing balance support: During functional activity Standing balance-Leahy Scale: Zero Standing balance comment: unable to stand, uses transfer board for transfers to and from recliner with total assist +2 today.                             Cognition Arousal/Alertness: Lethargic Behavior During Therapy: WFL for tasks assessed/performed Overall Cognitive Status: Impaired/Different from baseline Area of Impairment: Attention;Following commands;Safety/judgement;Problem solving                   Current Attention Level: Sustained   Following Commands: Follows one step commands with increased time;Follows one step  commands consistently Safety/Judgement: Decreased awareness of deficits;Decreased awareness of safety   Problem Solving: Requires tactile cues;Requires verbal cues;Difficulty sequencing;Decreased initiation;Slow processing General Comments: Pt appearing more drowsy this session, very slow moving and difficulty with command following.       Exercises      General Comments         Pertinent Vitals/Pain Pain Assessment: Faces Faces Pain Scale: Hurts even more Pain Location: bilateral LEs L>R Pain Descriptors / Indicators: Sore Pain Intervention(s): Limited activity within patient's tolerance;Monitored during session;Repositioned    Home Living                      Prior Function            PT Goals (current goals can now be found in the care plan section) Acute Rehab PT Goals Patient Stated Goal: be able to stand and walk PT Goal Formulation: With patient Time For Goal Achievement: 02/18/19 Potential to Achieve Goals: Fair Progress towards PT goals: Not progressing toward goals - comment(Pt requiring max-total assist +2 today)    Frequency    Min 3X/week      PT Plan Discharge plan needs to be updated    Co-evaluation              AM-PAC PT "6 Clicks" Mobility   Outcome Measure  Help needed turning from your back to your side while in a flat bed without using bedrails?: Total Help needed moving from lying on your back to sitting on the side of a flat bed without using bedrails?: Total Help needed moving to and from a bed to a chair (including a wheelchair)?: Total Help needed standing up from a chair using your arms (e.g., wheelchair or bedside chair)?: Total Help needed to walk in hospital room?: Total Help needed climbing 3-5 steps with a railing? : Total 6 Click Score: 6    End of Session Equipment Utilized During Treatment: Gait belt;Oxygen(2LO2 via [place]) Activity Tolerance: Patient limited by fatigue;Patient limited by pain Patient left: in chair;with chair alarm set;with call bell/phone within reach;with nursing/sitter in room Nurse Communication: Mobility status PT Visit Diagnosis: Other abnormalities of gait and mobility (R26.89);Muscle weakness (generalized) (M62.81);Pain Pain - Right/Left: Left Pain - part of body: Ankle and joints of foot     Time: 3419-3790 PT Time Calculation (min) (ACUTE ONLY): 30  min  Charges:  $Therapeutic Activity: 23-37 mins                     Julien Girt, PT Acute Rehabilitation Services Pager [phone]  Office Roseto D Elonda Husky 02/08/2019, 12:31 PM

## 2019-02-08 NOTE — Progress Notes (Signed)
PROGRESS NOTE    Kurt Cross  YQM:[id number] DOB: [birth year] DOA: 02/03/2019 PCP: [name], MD   Brief Narrative:  Kurt Cross is a 68 y.o. male with medical history significant of chronic systolic heart failure, A. fib/a flutter, AAA, chronic kidney disease stage III, complete heart block status post PPM, chronic constipation, type 2 diabetes, chronic heel wound who presents to the ED with a one-week history of worsening shortness of breath and worsening lower extremity edema.  Patient states lower extremity edema has been ongoing for greater than 1 week.  Patient also endorses a 8 to 10 pound weight gain over the past 2 weeks.  Patient denies any fevers, no chest pain, no nausea, no cough, no melena, no hematemesis, no hematochezia.  Patient does endorse a bout of emesis 1 week prior to admission.  Patient denies any diarrhea, no constipation.  Patient does endorse some soft stools.  Patient denies any abdominal pain.  No syncopal episodes, no dietary indiscretions.  Patient does endorse some dysuria.  Patient is noted in his chart has a history of medical noncompliance patient states has missed 1 weeks worth of his Crestor and furosemide stating he ran out of his medications.  Patient also endorses decreased oral intake and some insomnia.  Patient stated that his wife made him come to the ED.  ED Course: Patient seen in the ED, comprehensive metabolic profile with a glucose of 263, BUN of 31, creatinine of 2.33, albumin of 2.7 otherwise was within normal limits.  BNP of 194.2.  Troponin was negative.  CBC unremarkable.  INR of 2.7.  COVID-19 was negative.  Chest x-Puls done with chronic cardiomegaly, new slight pulmonary vascular prominence with probable posterior pleural effusions.  Plain films of the left foot negative for any acute abnormalities.  EKG done with a paced rhythm.  Patient given a dose of Lasix 80 mg IV x1.  Hospitalist were called to admit the patient for further  evaluation and management.    Assessment & Plan:   Principal Problem:   Acute exacerbation of CHF (congestive heart failure) (HCC) Active Problems:   Pneumatosis of gastric wall   Nausea & vomiting   Diabetes mellitus, insulin dependent (IDDM), uncontrolled (HCC)   Hyperlipidemia   Obesity, Class III, BMI 40-49.9 (morbid obesity) ([place])   Obstructive sleep apnea   Essential hypertension   Chronic combined systolic and diastolic CHF (congestive heart failure) (HCC)   Thoracic aortic aneurysm (HCC)   Atrial fibrillation/flutter   Debility   S/P placement of cardiac pacemaker   Chronic anticoagulation   CKD (chronic kidney disease) stage 4, GFR 15-29 ml/min (HCC)   Diabetic ulcer of ankle associated with type 2 diabetes mellitus, limited to breakdown of skin ([place])  1. acute on chronic systolic heart failure exacerbation Patient presented with shortness of breath x1 week, worsening lower extremity edema greater than a week, 8 to 10 pound weight gain over the past 2 weeks.  Patient afebrile.  No productive cough.  Questionable etiology.  May be secondary to medical noncompliance as patient does state he ran out of his furosemide and Crestor for greater than a week.  Per pharmacy tech patient noted to have refills of his furosemide with his medications.    Patient with a urine output of 1 L over the past 24 hours.  Cardiac enzymes negative x3.  TSH of 1.287 on 02/03/2019.    COVID-19 tested in the ED was negative. EKG is ventricularly paced.  2D echo with a EF of 35 to 40%, moderate left ventricular hypertrophy, moderate pulmonary hypertension.  Patient with a bump in creatinine.  IV Lasix has been transitioned to oral Lasix per cardiology.  Patient resumed on Zaroxolyn and spironolactone and Coreg.  Patient with a urine output of 3.325 L over the past 24 hours.  Current weight of 138.4 kg.  Patient is on a dysphagia 1 diet.  Patient also started per cardiology on isosorbide and hydralazine.   Unable to use ACE inhibitor/ARB.  Taper O2 as tolerated.  Appreciate cardiology input and recommendations.   2.  Nausea and vomiting/gastric pneumatosis/colonic ileus Patient noted to have significant nausea and vomiting with abdominal distention and tightness on 02/05/2019, and also noted to have a large bowel movement.  Patient with no further emesis this morning and somewhat drowsy. CT abdomen and pelvis which was done with moderately distended stomach with gastric pneumatosis and small volume portal venous gas.  Gas distended proximal small bowel loops without discrete transition point, prior proximal small bowel surgery.  Generalized distention of colon with ileus pattern.  Made n.p.o. and general surgery consulted.  NG tube was recommended however patient refused.  Patient with no further nausea and emesis.  Lactic acid level was within normal limits.  General surgery feels patient likely has an ileus and does not look toxic acting.  General surgery has started patient on a trial of clear liquids and advance as tolerated to a solid diet with bowel regimen.  Per general surgery patient worsens with nausea and emesis may consider NG tube and bowel rest.  General surgery feels patient has severe comorbidities and noted to be DNR and as such not going to be too aggressive.  Per general surgery patient is a very poor operative candidate with his heart failure renal disease, morbid obesity and history of multiple prior surgeries.  Patient started on clears and currently tolerating dysphagia 1 diet.  Antibiotics have been discontinued.  Patient seen by general surgery who are recommending diet to be advanced and no further recommendations from a surgical standpoint.  Will advance to a diabetic diet.  Per general surgery.    3.  Diabetes mellitus type 2 Hemoglobin A1c of 8.3.  Patient noted to have a blood glucose level of 68 at 2038hRS (02/04/2019) evening.  CBG of 107 this morning.  Patient noted to have nausea  and emesis with some abnormal findings on CT abdomen and pelvis.  Patient started on a diet.  NPH was discontinued as patient had low blood glucose levels and was n.p.o.  CBGs increasing and as such patient placed back on NPH.  Increase NPH to 10 units twice daily and uptitrate as needed for better blood glucose control.  Continue sliding scale insulin.  4.  Chronic A. fib/a flutter/status post PPM. Patient with nausea and emesis early the morning of 02/05/2019, with abdominal distention.  Patient with no further nausea or emesis.  Patient started on a diet per general surgery.  Coreg has been resumed per cardiology and IV Lopressor discontinued.  INR therapeutic at 2.6.  Potassium at 3.6.  Coumadin per pharmacy.    5.  Hyperlipidemia Fasting lipid panel with LDL of 49.  Patient with improvement with nausea and vomiting.  Continue statin.   6.  Chronic left heel wound Seems to be healing.  No drainage noted.  Wound care.  7.    Acute kidney injury on chronic kidney disease stage III Patient noted to have a slight bump  in her renal function felt likely secondary to cardiorenal syndrome in the setting of diuretics and decreased oral intake.  IV diuretics have been transitioned to oral diuretics.  Patient not on ACE inhibitor.  Renal function trending down will transition to oral diuretics.  Creatinine currently at 2.26 from 2.49 from 2.71.  Follow.    8.  Obesity  9.  Probable OSA Patient noted to be refusing CPAP during the hospitalization.  Importance of CPAP stressed to patient who agrees to use CPAP.  Will need formal outpatient sleep study.     DVT prophylaxis: Coumadin Code Status: DNR Family Communication: Updated patient.  Disposition Plan: Likely skilled nursing facility versus home with home health.   Consultants:   Cardiology: Dr. Harrell Gave 02/04/2019  General surgery: Dr. Johney Maine 02/05/2019  Procedures:   2D echo 02/04/2019  CT abdomen and pelvis 02/05/2019  Chest x-Cindric  02/05/2019    Antimicrobials:   IV Zosyn 02/05/2019>>>>> 02/07/2019   Subjective: Patient sitting up in chair working with physical therapy and noted to be a two-person assist.  Patient alert and following commands.  Patient denies any chest pain or shortness of breath.  No nausea or vomiting.  No significant abdominal pain.   Objective: Vitals:   02/07/19 2210 02/08/19 0502 02/08/19 0503 02/08/19 0912  BP:  (!) 150/85  137/72  Pulse:  (!) 43 91 69  Resp: 18 18    Temp:  98.3 F (36.8 C)    TempSrc:      SpO2:  96%    Weight:  (!) 138.4 kg    Height:        Intake/Output Summary (Last 24 hours) at 02/08/2019 1121 Last data filed at 02/08/2019 0502 Gross per 24 hour  Intake 457.12 ml  Output 3325 ml  Net -2867.88 ml   Filed Weights   02/06/19 0427 02/07/19 0520 02/08/19 0502  Weight: (!) 137.7 kg (!) 139.2 kg (!) 138.4 kg    Examination:  General exam: NAD Respiratory system: Clear to auscultation bilaterally.  No wheezes, no crackles, no rhonchi.  Normal respiratory effort.   Cardiovascular system: Regular rate rhythm no murmurs rubs or gallops.  No JVD.  1+ bilateral lower extremity edema left greater than right. Gastrointestinal system: Abdomen is soft, distended, positive bowel sounds, nontender to palpation.  No rebound.  No guarding.  Central nervous system: Alert and oriented.  Moving extremities spontaneously.  Extremities: Symmetric 5 x 5 power. Skin: No rashes, lesions or ulcers Psychiatry: Judgment and insight fair.  Mood and affect appropriate.     Data Reviewed: I have personally reviewed following labs and imaging studies  CBC: Recent Labs  Lab 02/03/19 1435 02/07/19 0548 02/08/19 0507  WBC 8.5 11.5* 13.3*  HGB 13.6 12.3* 12.3*  HCT 43.8 41.2 39.6  MCV 102.1* 104.6* 100.5*  PLT 210 195 681   Basic Metabolic Panel: Recent Labs  Lab 02/03/19 1530 02/04/19 0220 02/05/19 0502 02/06/19 0513 02/06/19 1709 02/07/19 0548 02/08/19 0507  NA  --  142  143 145  --  136 136  K  --  4.2 3.9 3.7  --  3.8 3.6  CL  --  105 102 102  --  95* 92*  CO2  --  29 30 33*  --  30 35*  GLUCOSE  --  179* 140* 125*  --  184* 208*  BUN  --  30* 33* 37*  --  36* 34*  CREATININE  --  2.41* 2.49* 2.71*  --  2.49* 2.26*  CALCIUM  --  8.2* 8.4* 8.2*  --  8.0* 8.5*  MG 1.7 2.0 1.9  --  1.7  --   --    GFR: Estimated Creatinine Clearance: 46.3 mL/min (A) (by C-G formula based on SCr of 2.26 mg/dL (H)). Liver Function Tests: Recent Labs  Lab 02/03/19 1435  AST 12*  ALT 10  ALKPHOS 33*  BILITOT 0.8  PROT 6.4*  ALBUMIN 2.7*   No results for input(s): LIPASE, AMYLASE in the last 168 hours. Recent Labs  Lab 02/06/19 1059  AMMONIA 19   Coagulation Profile: Recent Labs  Lab 02/04/19 0220 02/05/19 0502 02/06/19 0513 02/07/19 0548 02/08/19 0507  INR 2.8* 2.6* 2.5* 2.4* 2.6*   Cardiac Enzymes: Recent Labs  Lab 02/03/19 1435 02/03/19 2040 02/04/19 0220 02/04/19 0956  TROPONINI <0.03 <0.03 <0.03 <0.03   BNP (last 3 results) No results for input(s): PROBNP in the last 8760 hours. HbA1C: No results for input(s): HGBA1C in the last 72 hours. CBG: Recent Labs  Lab 02/07/19 1638 02/07/19 1952 02/08/19 0037 02/08/19 0500 02/08/19 0755  GLUCAP 271* 314* 272* 206* 155*   Lipid Profile: No results for input(s): CHOL, HDL, LDLCALC, TRIG, CHOLHDL, LDLDIRECT in the last 72 hours. Thyroid Function Tests: No results for input(s): TSH, T4TOTAL, FREET4, T3FREE, THYROIDAB in the last 72 hours. Anemia Panel: No results for input(s): VITAMINB12, FOLATE, FERRITIN, TIBC, IRON, RETICCTPCT in the last 72 hours. Sepsis Labs: Recent Labs  Lab 02/05/19 1761  LATICACIDVEN 1.1    Recent Results (from the past 240 hour(s))  SARS Coronavirus 2 [facility] order, Performed in [facility] hospital lab)     Status: None   Collection Time: 02/03/19  3:27 PM  Result Value Ref Range Status   SARS Coronavirus 2 NEGATIVE NEGATIVE Final    Comment: (NOTE) If  result is NEGATIVE SARS-CoV-2 target nucleic acids are NOT DETECTED. The SARS-CoV-2 RNA is generally detectable in upper and lower  respiratory specimens during the acute phase of infection. The lowest  concentration of SARS-CoV-2 viral copies this assay can detect is 250  copies / mL. A negative result does not preclude SARS-CoV-2 infection  and should not be used as the sole basis for treatment or other  patient management decisions.  A negative result may occur with  improper specimen collection / handling, submission of specimen other  than nasopharyngeal swab, presence of viral mutation(s) within the  areas targeted by this assay, and inadequate number of viral copies  (<250 copies / mL). A negative result must be combined with clinical  observations, patient history, and epidemiological information. If result is POSITIVE SARS-CoV-2 target nucleic acids are DETECTED. The SARS-CoV-2 RNA is generally detectable in upper and lower  respiratory specimens dur ing the acute phase of infection.  Positive  results are indicative of active infection with SARS-CoV-2.  Clinical  correlation with patient history and other diagnostic information is  necessary to determine patient infection status.  Positive results do  not rule out bacterial infection or co-infection with other viruses. If result is PRESUMPTIVE POSTIVE SARS-CoV-2 nucleic acids MAY BE PRESENT.   A presumptive positive result was obtained on the submitted specimen  and confirmed on repeat testing.  While 2019 novel coronavirus  (SARS-CoV-2) nucleic acids may be present in the submitted sample  additional confirmatory testing may be necessary for epidemiological  and / or clinical management purposes  to differentiate between  SARS-CoV-2 and other Sarbecovirus currently known to infect humans.  If clinically  indicated additional testing with an alternate test  methodology [phone]) is advised. The SARS-CoV-2 RNA is generally    detectable in upper and lower respiratory sp ecimens during the acute  phase of infection. The expected result is Negative. Fact Sheet for Patients:  StrictlyIdeas.no Fact Sheet for Healthcare Providers: BankingDealers.co.za This test is not yet approved or cleared by the [place] FDA and has been authorized for detection and/or diagnosis of SARS-CoV-2 by FDA under an Emergency Use Authorization (EUA).  This EUA will remain in effect (meaning this test can be used) for the duration of the COVID-19 declaration under Section 564(b)(1) of the Act, 21 U.S.C. section 360bbb-3(b)(1), unless the authorization is terminated or revoked sooner. Performed at [facility], [place] [address], [place], [place] [address]   Culture, Urine     Status: Abnormal   Collection Time: 02/03/19  6:59 PM  Result Value Ref Range Status   Specimen Description   Final    URINE, CLEAN CATCH Performed at [facility], [place] [address], [place], [place] [address]    Special Requests   Final    NONE Performed at [facility], [place] [address] [place], [place] [address]    Culture >=100,000 COLONIES/mL PROTEUS MIRABILIS (A)  Final   Report Status 02/07/2019 FINAL  Final   Organism ID, Bacteria PROTEUS MIRABILIS (A)  Final      Susceptibility   Proteus mirabilis - MIC*    AMPICILLIN <=2 SENSITIVE Sensitive     CEFAZOLIN <=4 SENSITIVE Sensitive     CEFTRIAXONE <=1 SENSITIVE Sensitive     CIPROFLOXACIN >=4 RESISTANT Resistant     GENTAMICIN <=1 SENSITIVE Sensitive     IMIPENEM 4 SENSITIVE Sensitive     NITROFURANTOIN 128 RESISTANT Resistant     TRIMETH/SULFA <=20 SENSITIVE Sensitive     AMPICILLIN/SULBACTAM <=2 SENSITIVE Sensitive     PIP/TAZO <=4 SENSITIVE Sensitive     * >=100,000 COLONIES/mL PROTEUS MIRABILIS  MRSA PCR Screening     Status: Abnormal   Collection Time: 02/04/19  9:38 AM  Result Value  Ref Range Status   MRSA by PCR POSITIVE (A) NEGATIVE Final    Comment:        The GeneXpert MRSA Assay (FDA approved for NASAL specimens only), is one component of a comprehensive MRSA colonization surveillance program. It is not intended to diagnose MRSA infection nor to guide or monitor treatment for MRSA infections. RESULT CALLED TO, READ BACK BY AND VERIFIED WITH: Tyler Aas [id number] @ [address] [place] Performed at [facility] [address], [place],  [address]          Radiology Studies: No results found.      Scheduled Meds:  carvedilol  25 mg Oral BID WC   Chlorhexidine Gluconate Cloth  6 each Topical Q0600   furosemide  80 mg Oral Daily   gabapentin  300 mg Oral TID   hydrALAZINE  10 mg Oral TID   insulin aspart  0-5 Units Subcutaneous Q4H   insulin NPH Human  10 Units Subcutaneous BID WC   isosorbide dinitrate  5 mg Oral TID   mouth rinse  15 mL Mouth Rinse BID   metolazone  2.5 mg Oral Daily   mupirocin ointment  1 application Nasal BID   [START ON 02/09/2019] pantoprazole  40 mg Oral Daily   potassium chloride SA  20 mEq Oral BID   psyllium  1 packet Oral BID  rosuvastatin  10 mg Oral Daily   saccharomyces boulardii  250 mg Oral BID   sodium chloride flush  3 mL Intravenous Q12H   spironolactone  25 mg Oral BID   warfarin  7.5 mg Oral q1800   Warfarin - Pharmacist Dosing Inpatient   Does not apply q1800   Continuous Infusions:  sodium chloride       LOS: 5 days    Time spent: 40 minutes    Irine Seal, MD [facility]  If 7PM-7AM, please contact night-coverage [email or web address] 02/08/2019, 11:21 AM

## 2019-02-08 NOTE — Progress Notes (Signed)
Assumed care of patient at this time. Agreed with ongoing nurse assessment and will cont to monitor.

## 2019-02-08 NOTE — Progress Notes (Addendum)
CC: Shortness of breath/now complaining of abdominal pain  Subjective: Patient in bed, still rather sleepy this morning.  Says he had a bowel movement this morning, 1 recorded for yesterday also.[name] Kitchen  He says he has been up walking this AM.  He is tolerating a dysphagia 1 diet.  He says he has pain in lower abdomen.  Then points to her lower abdomen mid abdomen.  He says walking and eating do not make the pain better or worse.  He said bowel movements to help some.  Objective: Vital signs in last 24 hours: Temp:  [98.3 F (36.8 C)-99 F (37.2 C)] 98.3 F (36.8 C) (05/04 0502) Pulse Rate:  [43-91] 91 (05/04 0503) Resp:  [17-18] 18 (05/04 0502) BP: (123-150)/(66-85) 150/85 (05/04 0502) SpO2:  [96 %-98 %] 96 % (05/04 0502) Weight:  [138.4 kg] 138.4 kg (05/04 0502) Last BM Date: 02/07/19 600 PO recorded 47 IV 3325 urine Wt down 1 pound yesterday Stool x 1 Afebrile, VSS, BP up at 3 AM Creatinine stable 2.26 today glucose 208 WBC 13.3 INR 2.6 Intake/Output from previous day: 05/03 0701 - 05/04 0700 In: 697.1 [P.O.:600; I.V.:47.1; IV Piggyback:50] Out: 9485 [Urine:3325] Intake/Output this shift: No intake/output data recorded.  General appearance: alert, cooperative, no distress and still sleepy GI: some distension? , very large abdomen. + BS.   Lab Results:  Recent Labs    02/07/19 0548 02/08/19 0507  WBC 11.5* 13.3*  HGB 12.3* 12.3*  HCT 41.2 39.6  PLT 195 200    BMET Recent Labs    02/07/19 0548 02/08/19 0507  NA 136 136  K 3.8 3.6  CL 95* 92*  CO2 30 35*  GLUCOSE 184* 208*  BUN 36* 34*  CREATININE 2.49* 2.26*  CALCIUM 8.0* 8.5*   PT/INR Recent Labs    02/07/19 0548 02/08/19 0507  LABPROT 25.5* 27.3*  INR 2.4* 2.6*    Recent Labs  Lab 02/03/19 1435  AST 12*  ALT 10  ALKPHOS 33*  BILITOT 0.8  PROT 6.4*  ALBUMIN 2.7*     Lipase     Component Value Date/Time   LIPASE 30 11/10/2017 1143     Prior to Admission medications    Medication Sig Start Date End Date Taking? Authorizing Provider  carvedilol (COREG) 25 MG tablet Take 25 mg by mouth 2 (two) times daily. 07/25/14  Yes [provider]  cyclobenzaprine (FLEXERIL) 5 MG tablet Take 5 mg by mouth 3 (three) times daily.   Yes [provider]  furosemide (LASIX) 80 MG tablet Take 80 mg by mouth daily.    Yes [provider]  gabapentin (NEURONTIN) 300 MG capsule Take 300 mg by mouth 3 (three) times daily.  10/17/17  Yes [provider]  insulin NPH Human (HUMULIN N,NOVOLIN N) 100 UNIT/ML injection Inject 20 units in the morning and 20 units in the evening.   Yes [provider]  insulin regular (NOVOLIN R,HUMULIN R) 100 units/mL injection Use with sliding scale.   Yes [provider]  KLOR-CON M20 20 MEQ tablet Take 20 mg by mouth 2 (two) times daily. 01/06/18  Yes [provider]  metolazone (ZAROXOLYN) 2.5 MG tablet Take 2.5 mg by mouth daily.   Yes [provider]  rosuvastatin (CRESTOR) 10 MG tablet Take 10 mg by mouth daily.   Yes [provider]  traZODone (DESYREL) 50 MG tablet Take 100 mg by mouth at bedtime as needed for sleep.  06/29/18  Yes [provider]  warfarin (COUMADIN) 7.5 MG tablet Take 7.5 mg by mouth daily. 09/25/18  Yes [provider]  Adhesive Tape (ADHESIVE PAPER) TAPE To use with pads/dressing. 12/08/18   [name], MD  Adhesive Tape TAPE To use with dressings/pads. 12/08/18   [name], MD  Blood Glucose Monitoring Suppl (ACCU-CHEK AVIVA) device Use as instructed 08/25/18 08/25/19  [name], MD  Disposable Gloves MISC To use with wound dressing/changing. 12/08/18   [name], MD  Gauze Bandages MISC To use daily. 12/08/18   [name], MD  Gauze Pads & Dressings (ABDOMINAL PAD) 8"X10" PADS 1 each by Does not apply route as needed. 12/08/18   [name], MD  Gauze Pads & Dressings  (AMD FOAM DRESSING) 4"X4" PADS To use daily. 12/08/18   [name], MD  Gauze Pads & Dressings [facility] BANDAGE ROLL) MISC To use daily. 12/08/18   [name], MD  Skin Protectants, Misc. (EUCERIN) cream Apply topically as needed for wound care. 12/08/18   [name], MD  spironolactone (ALDACTONE) 25 MG tablet Take 1 tablet (25 mg total) by mouth 2 (two) times daily. Patient not taking: Reported on 02/03/2019 09/09/18   [name], MD    Medications: . carvedilol  25 mg Oral BID WC  . Chlorhexidine Gluconate Cloth  6 each Topical Q0600  . chlorpheniramine-HYDROcodone  5 mL Oral Once  . furosemide  80 mg Oral Daily  . gabapentin  300 mg Oral TID  . hydrALAZINE  10 mg Oral TID  . insulin aspart  0-5 Units Subcutaneous Q4H  . insulin NPH Human  5 Units Subcutaneous QHS  . isosorbide dinitrate  5 mg Oral TID  . mouth rinse  15 mL Mouth Rinse BID  . metolazone  2.5 mg Oral Daily  . mupirocin ointment  1 application Nasal BID  . pantoprazole (PROTONIX) IV  40 mg Intravenous Q24H  . potassium chloride SA  20 mEq Oral BID  . psyllium  1 packet Oral BID  . rosuvastatin  10 mg Oral Daily  . saccharomyces boulardii  250 mg Oral BID  . sodium chloride flush  3 mL Intravenous Q12H  . spironolactone  25 mg Oral BID  . Warfarin - Pharmacist Dosing Inpatient   Does not apply q1800   . 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  No acute peritonitis on exam.  Not sure how long the pain has been going on.  He told me it started yesterday, he started complaining 4/30 last week.   Will review with Dr. Ninfa Linden.  Continue to watch the WBC.    LOS: 5 days    Kurt Cross 02/08/2019 [id number]

## 2019-02-08 NOTE — Progress Notes (Signed)
[place] for warfarin Indication: Afib/flutter  No Known Allergies  Patient Measurements: Height: 5\' 11"  (180.3 cm) Weight: (!) 305 lb 1.6 oz (138.4 kg) IBW/kg (Calculated) : 75.3  Vital Signs: Temp: 98.3 F (36.8 C) (05/04 0502) BP: 137/72 (05/04 0912) Pulse Rate: 69 (05/04 0912)  Labs: Recent Labs    02/06/19 0513 02/07/19 0548 02/08/19 0507  HGB  --  12.3* 12.3*  HCT  --  41.2 39.6  PLT  --  195 200  LABPROT 26.5* 25.5* 27.3*  INR 2.5* 2.4* 2.6*  CREATININE 2.71* 2.49* 2.26*    Estimated Creatinine Clearance: 46.3 mL/min (A) (by C-G formula based on SCr of 2.26 mg/dL (H)).  Assessment: 39 yoM with PMH sCHF, Afib/flutter on warfarin, AAA, CKD3, complete HB s/p pacer, DM2, admitted for AECHF. Pharmacy to continue warfarin.   Baseline INR 2.7  Prior anticoagulation: warfarin 7.5 mg daily, LD 4/29 - some confusion about last dose since patient has history of noncompliance with other meds, is currently altered and changed his story during the admission. However, given INR 2.7, d/w TRH MD and felt reasonably confident that dose was taken today  Significant events: 5/1 unable to tolerate POs and missed warfarin dose.  Today, 02/08/2019:  INR remains therapeutic at 2.6  CBC: Hgb slightly low, Plt WNL  Major drug interactions: none currently  No bleeding issues per notes  Diet as tolerated, no current plans for surgery  Goal of Therapy: INR 2-3  Plan:  Resume PTA dose of 7.5 mg daily  Daily INR  Monitor for signs of bleeding or thrombosis  IV PPI>PO  Eudelia Bunch, Pharm.D 02/08/2019 9:44 AM

## 2019-02-08 NOTE — TOC Initial Note (Signed)
Transition of Care [facility]) - Initial/Assessment Note    Patient Details  Name: Kurt Cross MRN: [id number] Date of Birth: 23-May-1951  Transition of Care [facility]) CM/SW Contact:    Dessa Phi, RN Phone Number: 02/08/2019, 3:04 PM  Clinical Narrative: Patient agreed to SNF.Faxed out to SNF-await bed offers.                  Expected Discharge Plan: Skilled Nursing Facility Barriers to Discharge: Continued Medical Work up   Patient Goals and CMS Choice   CMS Medicare.gov Compare Post Acute Care list provided to:: Patient Choice offered to / list presented to : Patient  Expected Discharge Plan and Services Expected Discharge Plan: [place]   Discharge Planning Services: CM Consult Post Acute Care Choice: [place] Living arrangements for the past 2 months: Single Family Home Expected Discharge Date: (unknown)                             Date HH Agency Contacted: 02/04/19 Time HH Agency Contacted: 0930 Representative spoke with at [place]: Daine Gip  Prior Living Arrangements/Services Living arrangements for the past 2 months: [place] Lives with:: Significant Other Patient language and need for interpreter reviewed:: Yes        Need for Family Participation in Patient Care: No (Comment) Care giver support system in place?: Yes (comment) Current home services: DME(w/c,CPAP) Criminal Activity/Legal Involvement Pertinent to Current Situation/Hospitalization: No - Comment as needed  Activities of Daily Living Home Assistive Devices/Equipment: CBG Meter, Cane (specify quad or straight), Walker (specify type), Wheelchair, Hospital bed(4 wheeled walker, single point cane, ramped entrance) ADL Screening (condition at time of admission) Patient's cognitive ability adequate to safely complete daily activities?: Yes Is the patient deaf or have difficulty hearing?: No Does the patient have difficulty seeing, even when  wearing glasses/contacts?: No Does the patient have difficulty concentrating, remembering, or making decisions?: Yes Patient able to express need for assistance with ADLs?: Yes Does the patient have difficulty dressing or bathing?: Yes Independently performs ADLs?: No Communication: Independent Dressing (OT): Needs assistance Is this a change from baseline?: Pre-admission baseline Grooming: Needs assistance Is this a change from baseline?: Pre-admission baseline Feeding: Needs assistance Is this a change from baseline?: Pre-admission baseline Bathing: Needs assistance Is this a change from baseline?: Pre-admission baseline Toileting: Needs assistance Is this a change from baseline?: Pre-admission baseline In/Out Bed: Needs assistance Is this a change from baseline?: Pre-admission baseline Walks in Home: Needs assistance Is this a change from baseline?: Pre-admission baseline Does the patient have difficulty walking or climbing stairs?: Yes Weakness of Legs: Both Weakness of Arms/Hands: Both  Permission Sought/Granted Permission sought to share information with : Case Manager Permission granted to share information with : Yes, Verbal Permission Granted  Share Information with NAME: Tuvia Woodrick  Permission granted to share info w AGENCY: SNF  Permission granted to share info w Relationship: spouse  Permission granted to share info w Contact Information: [phone]  Emotional Assessment Appearance:: Appears stated age Attitude/Demeanor/Rapport: Gracious Affect (typically observed): Accepting Orientation: : Oriented to Self, Oriented to Place, Oriented to  Time, Oriented to Situation Alcohol / Substance Use: Tobacco Use, Alcohol Use Psych Involvement: No (comment)  Admission diagnosis:  Hypoxia [R09.02] Patient Active Problem List   Diagnosis Date Noted  . On anticoagulant therapy 02/07/2019  . Dependence on wheelchair 02/07/2019  . Pneumatosis of gastric wall 02/05/2019  .  Nausea & vomiting 02/05/2019  . Acute exacerbation of CHF (congestive heart failure) ([place]) 02/03/2019  . Diabetic ulcer of ankle associated with type 2 diabetes mellitus, limited to breakdown of skin ([place]) 12/03/2018  . CKD (chronic kidney disease) stage 4, GFR 15-29 ml/min (HCC) 02/17/2018  . S/P placement of cardiac pacemaker 02/22/2016  . Atrial flutter ([place]) 02/22/2016  . Chronic anticoagulation 02/22/2016  . H/O medication noncompliance 02/22/2016  . Debility 02/10/2016  . Constipation 02/07/2016  . Acute on chronic combined systolic and diastolic CHF, NYHA class 2 ([place]) 01/27/2016  . Acute on chronic renal failure ([place]) 01/27/2016  . Acute on chronic respiratory failure with hypoxia ([place]) 01/27/2016  . Hypoxia 01/26/2016  . Atrial fibrillation/flutter 10/06/2013  . Recurrent ventral incisional hernia 08/14/2013  . Thoracic aortic aneurysm ([place]) 08/12/2013  . Right leg pain 08/11/2013  . Chronic combined systolic and diastolic CHF (congestive heart failure) ([place]) 08/04/2013  . Essential hypertension 06/28/2012  . Diabetes mellitus, insulin dependent (IDDM), uncontrolled ([place]) 09/01/2007  . Hyperlipidemia 09/01/2007  . Obesity, Class III, BMI 40-49.9 (morbid obesity) ([place]) 09/01/2007  . Obstructive sleep apnea 09/01/2007   PCP:  [name], MD Pharmacy:   [facility]), [place] - [address] [place] ([place]) [place] [address] Phone: [phone] Fax: [phone]  [place], [place] [facility] [address] [place] [address] [place] [address] Phone: [phone] Fax: [phone]     Social Determinants of Health (SDOH) Interventions    Readmission Risk Interventions No flowsheet data found.

## 2019-02-08 NOTE — Progress Notes (Signed)
Pt. placed on CPAP auto with Oxygen placed at 2lpm, refilled humidifier with S/W, made aware to notify if needed, RN made aware.

## 2019-02-08 NOTE — Consult Note (Signed)
   [facility] CM Inpatient Consult   02/08/2019  Kurt Cross 11-19-1950 [id number]   Patient screened for potential [facility] Care Management services due to unplanned readmission risk score of, 27%, high. Patient had been active in the past with [facility] CM Pharmacist for assistance with medication needs.  Spoke with [name] by telephone in hospital room. HIPAA identifiers were verified. Reviewed the [facility] CM program with patient and assessed for community needs. [name] is agreeable to [facility] CM RN follow up when transitioned to home. Will continue to follow for progression plans.  Of note, [facility] Care Management services does not replace or interfere with any services that are arranged by inpatient case management or social work.  Netta Cedars, MSN, [facility] Liaison Nurse Mobile Phone [phone]  Toll free office [phone]

## 2019-02-08 NOTE — NC FL2 (Signed)
[place] LEVEL OF CARE SCREENING TOOL     IDENTIFICATION  Patient Name: Kurt Cross Birthdate: [birth year] Sex: male Admission Date (Current Location): 02/03/2019  [facility] [place] Number:  Herbalist and Address:  [facility],  [place], [place]      Provider Number: [id number]  Attending Physician Name and Address:  [name], MD  Relative Name and Phone Number:  Kaysan Peixoto [phone]    Current Level of Care: Hospital Recommended Level of Care: [place] Prior Approval Number:    Date Approved/Denied:   PASRR Number: [id number]  Discharge Plan: SNF    Current Diagnoses: Patient Active Problem List   Diagnosis Date Noted  . On anticoagulant therapy 02/07/2019  . Dependence on wheelchair 02/07/2019  . Pneumatosis of gastric wall 02/05/2019  . Nausea & vomiting 02/05/2019  . Acute exacerbation of CHF (congestive heart failure) ([place]) 02/03/2019  . Diabetic ulcer of ankle associated with type 2 diabetes mellitus, limited to breakdown of skin ([place]) 12/03/2018  . CKD (chronic kidney disease) stage 4, GFR 15-29 ml/min (HCC) 02/17/2018  . S/P placement of cardiac pacemaker 02/22/2016  . Atrial flutter ([place]) 02/22/2016  . Chronic anticoagulation 02/22/2016  . H/O medication noncompliance 02/22/2016  . Debility 02/10/2016  . Constipation 02/07/2016  . Acute on chronic combined systolic and diastolic CHF, NYHA class 2 ([place]) 01/27/2016  . Acute on chronic renal failure ([place]) 01/27/2016  . Acute on chronic respiratory failure with hypoxia ([place]) 01/27/2016  . Hypoxia 01/26/2016  . Atrial fibrillation/flutter 10/06/2013  . Recurrent ventral incisional hernia 08/14/2013  . Thoracic aortic aneurysm ([place]) 08/12/2013  . Right leg pain 08/11/2013  . Chronic combined systolic and diastolic CHF (congestive heart failure) ([place]) 08/04/2013  . Essential hypertension 06/28/2012  . Diabetes mellitus,  insulin dependent (IDDM), uncontrolled ([place]) 09/01/2007  . Hyperlipidemia 09/01/2007  . Obesity, Class III, BMI 40-49.9 (morbid obesity) ([place]) 09/01/2007  . Obstructive sleep apnea 09/01/2007    Orientation RESPIRATION BLADDER Height & Weight     Self, Time, Situation, Place  O2(R buttock cheek stage 2-foam dsg;L heel red shallow opening-foam dsg;scrotum-skin tear-clean,dry) Incontinent, External catheter Weight: (!) 138.4 kg Height:  5\' 11"  (180.3 cm)  BEHAVIORAL SYMPTOMS/MOOD NEUROLOGICAL BOWEL NUTRITION STATUS      Incontinent Diet(dysphagia 1)  AMBULATORY STATUS COMMUNICATION OF NEEDS Skin   Limited Assist Verbally Skin abrasions, Other (Comment)                       Personal Care Assistance Level of Assistance  Bathing, Feeding, Dressing Bathing Assistance: Limited assistance Feeding assistance: Limited assistance Dressing Assistance: Limited assistance     Functional Limitations Info             SPECIAL CARE FACTORS FREQUENCY  PT (By licensed PT), OT (By licensed OT)     PT Frequency: 5x week OT Frequency: 5x week            Contractures Contractures Info: Not present    Additional Factors Info  Code Status, Insulin Sliding Scale Code Status Info: DNR     Insulin Sliding Scale Info: SSI       Current Medications (02/08/2019):  This is the current hospital active medication list Current Facility-Administered Medications  Medication Dose Route Frequency Provider Last Rate Last Dose  . 0.9 %  sodium chloride infusion  250 mL Intravenous PRN [name], MD      .  acetaminophen (TYLENOL) tablet 650 mg  650 mg Oral Q4H PRN [name], MD   650 mg at 02/08/19 0912  . bisacodyl (DULCOLAX) suppository 10 mg  10 mg Rectal Q12H PRN Michael Boston, MD      . carvedilol (COREG) tablet 25 mg  25 mg Oral BID WC Buford Dresser, MD   25 mg at 02/08/19 0913  . Chlorhexidine Gluconate Cloth 2 % PADS 6 each  6 each Topical Q0600 [name], MD   6 each at 02/08/19 0507  . furosemide (LASIX) tablet 80 mg  80 mg Oral Daily Buford Dresser, MD   80 mg at 02/08/19 0914  . gabapentin (NEURONTIN) capsule 300 mg  300 mg Oral TID [name], MD   300 mg at 02/08/19 1610  . hydrALAZINE (APRESOLINE) tablet 10 mg  10 mg Oral TID Buford Dresser, MD   10 mg at 02/08/19 0914  . hydrocerin (EUCERIN) cream   Topical PRN [name], MD      . insulin aspart (novoLOG) injection 0-5 Units  0-5 Units Subcutaneous Q4H [name], MD   2 Units at 02/08/19 1200  . insulin NPH Human (NOVOLIN N) injection 10 Units  10 Units Subcutaneous BID WC [name], MD   10 Units at 02/08/19 0915  . isosorbide dinitrate (ISORDIL) tablet 5 mg  5 mg Oral TID Buford Dresser, MD   5 mg at 02/08/19 0913  . MEDLINE mouth rinse  15 mL Mouth Rinse BID [name], MD   15 mL at 02/08/19 0919  . metolazone (ZAROXOLYN) tablet 2.5 mg  2.5 mg Oral Daily [name], MD   2.5 mg at 02/08/19 0912  . mupirocin ointment (BACTROBAN) 2 % 1 application  1 application Nasal BID [name], MD   1 application at 96/04/54 0919  . ondansetron (ZOFRAN) injection 4 mg  4 mg Intravenous Q6H PRN [name], MD   4 mg at 02/04/19 2009  . [START ON 02/09/2019] pantoprazole (PROTONIX) EC tablet 40 mg  40 mg Oral Daily Eudelia Bunch, RPH      . potassium chloride SA (K-DUR) CR tablet 20 mEq  20 mEq Oral BID [name], MD   20 mEq at 02/08/19 0914  . psyllium (HYDROCIL/METAMUCIL) packet 1 packet  1 packet Oral BID Michael Boston, MD   1 packet at 02/08/19 0919  . rosuvastatin (CRESTOR) tablet 10 mg  10 mg Oral Daily [name], MD   10 mg at 02/08/19 0914  . saccharomyces boulardii (FLORASTOR) capsule 250 mg  250 mg Oral BID Michael Boston, MD   250 mg at 02/08/19 0914  . sodium chloride flush (NS) 0.9 % injection 3 mL  3 mL Intravenous Q12H [name], MD   3 mL at 02/08/19 0920  . sodium  chloride flush (NS) 0.9 % injection 3 mL  3 mL Intravenous PRN [name], MD      . spironolactone (ALDACTONE) tablet 25 mg  25 mg Oral BID [name], MD   25 mg at 02/08/19 0914  . traZODone (DESYREL) tablet 100 mg  100 mg Oral QHS PRN [name], MD   100 mg at 02/03/19 2146  . warfarin (COUMADIN) tablet 7.5 mg  7.5 mg Oral q1800 Eudelia Bunch, RPH      . Warfarin - Pharmacist Dosing Inpatient   Does not apply [address] [place], [facility]  Discharge Medications: Please see discharge summary for a list of discharge medications.  Relevant Imaging Results:  Relevant Lab Results:   Additional Information [id number]  Dessa Phi, RN

## 2019-02-08 NOTE — TOC Progression Note (Signed)
Transition of Care [facility]) - Progression Note    Patient Details  Name: Kurt Cross MRN: [id number] Date of Birth: [birth year]  [facility]) CM/SW Contact  Sanaia Jasso, Juliann Pulse, RN Phone Number: 02/08/2019, 3:20 PM  Clinical Narrative:  Patient choses bed @ [facility]-spouse also informed both agree.Await d/c.     Expected Discharge Plan: [place] Barriers to Discharge: Continued Medical Work up  Expected Discharge Plan and Services Expected Discharge Plan: [place]   Discharge Planning Services: CM Consult Post Acute Care Choice: [place] Living arrangements for the past 2 months: Single Family Home Expected Discharge Date: (unknown)                             Date HH Agency Contacted: 02/04/19 Time HH Agency Contacted: 0930 Representative spoke with at [place]: Daine Gip   Social Determinants of Health (SDOH) Interventions    Readmission Risk Interventions No flowsheet data found.

## 2019-02-08 NOTE — TOC Transition Note (Signed)
Transition of Care [facility]) - CM/SW Discharge Note   Patient Details  Name: Kurt Cross MRN: [id number] Date of Birth: Dec 07, 1950  [facility]) CM/SW Contact:  Dessa Phi, RN Phone Number: 02/08/2019, 3:04 PM   Clinical Narrative: Patient agreed to SNF-faxed info to SNF-await bed offers.      Final next level of care: Skilled Nursing Facility Barriers to Discharge: Continued Medical Work up   Patient Goals and CMS Choice   CMS Medicare.gov Compare Post Acute Care list provided to:: Patient Choice offered to / list presented to : Patient  Discharge Placement PASRR number recieved: 02/08/19                     Discharge Plan and Services   Discharge Planning Services: CM Consult Post Acute Care Choice: [place]                        Date [place]: 02/04/19 Time [place]: 5638 Representative spoke with at [place]: Daine Gip  Social Determinants of Health ([place]) Interventions     Readmission Risk Interventions No flowsheet data found.

## 2019-02-09 LAB — CBC
HCT: 40.7 % (ref 39.0–52.0)
Hemoglobin: 12.8 g/dL — ABNORMAL LOW (ref 13.0–17.0)
MCH: 31.2 pg (ref 26.0–34.0)
MCHC: 31.4 g/dL (ref 30.0–36.0)
MCV: 99.3 fL (ref 80.0–100.0)
Platelets: 193 10*3/uL (ref 150–400)
RBC: 4.1 MIL/uL — ABNORMAL LOW (ref 4.22–5.81)
RDW: 15.9 % — ABNORMAL HIGH (ref 11.5–15.5)
WBC: 9.1 10*3/uL (ref 4.0–10.5)
nRBC: 0 % (ref 0.0–0.2)

## 2019-02-09 LAB — PROTIME-INR
INR: 2.3 — ABNORMAL HIGH (ref 0.8–1.2)
Prothrombin Time: 25.2 seconds — ABNORMAL HIGH (ref 11.4–15.2)

## 2019-02-09 LAB — BASIC METABOLIC PANEL
Anion gap: 10 (ref 5–15)
BUN: 34 mg/dL — ABNORMAL HIGH (ref 8–23)
CO2: 38 mmol/L — ABNORMAL HIGH (ref 22–32)
Calcium: 8.8 mg/dL — ABNORMAL LOW (ref 8.9–10.3)
Chloride: 88 mmol/L — ABNORMAL LOW (ref 98–111)
Creatinine, Ser: 1.95 mg/dL — ABNORMAL HIGH (ref 0.61–1.24)
GFR calc Af Amer: 41 mL/min — ABNORMAL LOW (ref >60)
GFR calc non Af Amer: 35 mL/min — ABNORMAL LOW (ref >60)
Glucose, Bld: 141 mg/dL — ABNORMAL HIGH (ref 70–99)
Potassium: 3.3 mmol/L — ABNORMAL LOW (ref 3.5–5.1)
Sodium: 136 mmol/L (ref 135–145)

## 2019-02-09 LAB — GLUCOSE, CAPILLARY
Glucose-Capillary: 340 mg/dL — ABNORMAL HIGH (ref 70–99)
Glucose-Capillary: 151 mg/dL — ABNORMAL HIGH (ref 70–99)
Glucose-Capillary: 146 mg/dL — ABNORMAL HIGH (ref 70–99)
Glucose-Capillary: 207 mg/dL — ABNORMAL HIGH (ref 70–99)
Glucose-Capillary: 281 mg/dL — ABNORMAL HIGH (ref 70–99)
Glucose-Capillary: 254 mg/dL — ABNORMAL HIGH (ref 70–99)

## 2019-02-09 NOTE — Progress Notes (Addendum)
Occupational Therapy Treatment Patient Details Name: Kurt Cross MRN: [id number] DOB: 12-17-1950 Today's Date: 02/09/2019    History of present illness 68 year old man was admitted to ED on 4/29 with SOB/CHF exacerbation.  PMH:  AAA, CKD, Heart block with pacemaker, DM, and chronic L heel   OT comments  Pt sat up EOB for 25 minutes today with mod A for mobility.  Performed self feeding and grooming tasks as well as leaning for ADLs.  Pt with R hand swelling and pain.  Provided built up foam for brushing teeth and self feeding   Follow Up Recommendations  SNF    Equipment Recommendations  None recommended by OT    Recommendations for Other Services      Precautions / Restrictions Precautions Precautions: Fall;ICD/Pacemaker Restrictions Weight Bearing Restrictions: No Other Position/Activity Restrictions: decreased tolerance of WB on RUE but able to pull up on bedrail to reposition in bed      Mobility Bed Mobility         Supine to sit: Mod assist Sit to supine: Mod assist   General bed mobility comments: use of bed rail and HOB raised.  Assist for bil LEs for back to bed. Pt was able to use bil UEs to pull himself up in bed, despite pain in RUE.  Leaned to bil sides to simulate ADLs, but pt needed mod A to right self back to midline  Transfers                      Balance     Sitting balance-Leahy Scale: Fair                                     ADL either performed or assessed with clinical judgement   ADL                                         General ADL Comments: sat EOB and brushed teeth with min A to reach supplies. Self fed with set up (jello) with built up foam     Vision       Perception     Praxis      Cognition Arousal/Alertness: Awake/alert Behavior During Therapy: WFL for tasks assessed/performed Overall Cognitive Status: Within Functional Limits for tasks assessed                                           Exercises     Shoulder Instructions       General Comments 5/5 pt had twin granddaughters born early this am    Pertinent Vitals/ Pain       Pain Assessment: 0-10 Pain Score: 8  Pain Location: R hand Pain Descriptors / Indicators: Sore Pain Intervention(s): Limited activity within patient's tolerance;Monitored during session;Repositioned  Home Living                                          Prior Functioning/Environment              Frequency  Min 2X/week  Progress Toward Goals  OT Goals(current goals can now be found in the care plan section)  Progress towards OT goals: Not progressing toward goals - comment(R hand pain affecting adls)     Plan      Co-evaluation                 AM-PAC OT "6 Clicks" Daily Activity     Outcome Measure   Help from another person eating meals?: A Little Help from another person taking care of personal grooming?: A Little Help from another person toileting, which includes using toliet, bedpan, or urinal?: A Lot Help from another person bathing (including washing, rinsing, drying)?: A Lot Help from another person to put on and taking off regular upper body clothing?: A Lot Help from another person to put on and taking off regular lower body clothing?: A Lot 6 Click Score: 14    End of Session    OT Visit Diagnosis: Muscle weakness (generalized) (M62.81)   Activity Tolerance Patient tolerated treatment well   Patient Left in bed;with call bell/phone within reach;with bed alarm set   Nurse Communication          Time: 6861-6837 OT Time Calculation (min): 36 min  Charges: OT General Charges $OT Visit: 1 Visit OT Treatments $Therapeutic Activity: 23-37 mins  [name], OTR/L Acute Rehabilitation Services [phone] [facility] pager [phone] office 02/09/2019   [place] 02/09/2019, 3:56 PM

## 2019-02-09 NOTE — Care Management Important Message (Signed)
Important Message  Patient Details IM Letter given to [facility] Case Manager to present to the Patient Name: Kurt Cross MRN: [id number] Date of Birth: Jul 18, 1951   Medicare Important Message Given:  Yes    Kerin Salen 02/09/2019, 12:00 [place] Message  Patient Details  Name: Kurt Cross MRN: [id number] Date of Birth: [birth year]   Medicare Important Message Given:  Yes    Kerin Salen 02/09/2019, 12:00 PM

## 2019-02-09 NOTE — Progress Notes (Addendum)
PROGRESS NOTE    Kurt Cross  OZH:[id number] DOB: Apr 14, 1951 DOA: 02/03/2019 PCP: [name], MD   Brief Narrative:  Kurt Cross is a 68 y.o. male with medical history significant of chronic systolic heart failure, A. fib/a flutter, AAA, chronic kidney disease stage III, complete heart block status post PPM, chronic constipation, type 2 diabetes, chronic heel wound who presents to the ED with a one-week history of worsening shortness of breath and worsening lower extremity edema.  Patient states lower extremity edema has been ongoing for greater than 1 week.  Patient also endorses a 8 to 10 pound weight gain over the past 2 weeks.  Patient denies any fevers, no chest pain, no nausea, no cough, no melena, no hematemesis, no hematochezia.  Patient does endorse a bout of emesis 1 week prior to admission.  Patient denies any diarrhea, no constipation.  Patient does endorse some soft stools.  Patient denies any abdominal pain.  No syncopal episodes, no dietary indiscretions.  Patient does endorse some dysuria.  Patient is noted in his chart has a history of medical noncompliance patient states has missed 1 weeks worth of his Crestor and furosemide stating he ran out of his medications.  Patient also endorses decreased oral intake and some insomnia.  Patient stated that his wife made him come to the ED.  ED Course: Patient seen in the ED, comprehensive metabolic profile with a glucose of 263, BUN of 31, creatinine of 2.33, albumin of 2.7 otherwise was within normal limits.  BNP of 194.2.  Troponin was negative.  CBC unremarkable.  INR of 2.7.  COVID-19 was negative.  Chest x-Zorn done with chronic cardiomegaly, new slight pulmonary vascular prominence with probable posterior pleural effusions.  Plain films of the left foot negative for any acute abnormalities.  EKG done with a paced rhythm.  Patient given a dose of Lasix 80 mg IV x1.  Hospitalist were called to admit the patient for further  evaluation and management.    Assessment & Plan:   Principal Problem:   Acute exacerbation of CHF (congestive heart failure) (HCC) Active Problems:   Pneumatosis of gastric wall   Nausea & vomiting   Diabetes mellitus, insulin dependent (IDDM), uncontrolled (HCC)   Hyperlipidemia   Obesity, Class III, BMI 40-49.9 (morbid obesity) ([place])   Obstructive sleep apnea   Essential hypertension   Chronic combined systolic and diastolic CHF (congestive heart failure) (HCC)   Thoracic aortic aneurysm (HCC)   Atrial fibrillation/flutter   Debility   S/P placement of cardiac pacemaker   Chronic anticoagulation   CKD (chronic kidney disease) stage 4, GFR 15-29 ml/min (HCC)   Diabetic ulcer of ankle associated with type 2 diabetes mellitus, limited to breakdown of skin ([place])  1. acute on chronic systolic heart failure exacerbation Patient presented with shortness of breath x1 week, worsening lower extremity edema greater than a week, 8 to 10 pound weight gain over the past 2 weeks.  Patient afebrile.  No productive cough.  Questionable etiology.  May be secondary to medical noncompliance as patient does state he ran out of his furosemide and Crestor for greater than a week.  Per pharmacy tech patient noted to have refills of his furosemide with his medications.    Patient with a urine output of 1 L over the past 24 hours.  Cardiac enzymes negative x3.  TSH of 1.287 on 02/03/2019.    COVID-19 tested in the ED was negative. EKG is ventricularly paced.  2D echo with a EF of 35 to 40%, moderate left ventricular hypertrophy, moderate pulmonary hypertension.  Patient with a bump in creatinine.  IV Lasix has been transitioned to oral Lasix per cardiology.  Patient resumed on Zaroxolyn and spironolactone and Coreg.  Patient with a urine output of 1 L over the past 24 hours.  Current weight of 136.7 kg from 138.4 kg.  Patient's weight not correctly recorded since admission.  Patient currently on a dysphagia 3  diet/diabetic diet which he is tolerating.  Patient also started per cardiology on isosorbide and hydralazine.  Unable to use ACE inhibitor/ARB.  Taper O2 as tolerated.  Appreciate cardiology input and recommendations.   2.  Nausea and vomiting/gastric pneumatosis/colonic ileus Patient noted to have significant nausea and vomiting with abdominal distention and tightness on 02/05/2019, and also noted to have a large bowel movement.  Patient with no further emesis this morning and somewhat drowsy. CT abdomen and pelvis which was done with moderately distended stomach with gastric pneumatosis and small volume portal venous gas.  Gas distended proximal small bowel loops without discrete transition point, prior proximal small bowel surgery.  Generalized distention of colon with ileus pattern.  Made n.p.o. and general surgery consulted.  NG tube was recommended however patient refused.  Patient with no further nausea and emesis.  Lactic acid level was within normal limits.  General surgery feels patient likely has an ileus and does not look toxic acting.  General surgery has started patient on a trial of clear liquids and advance as tolerated to a solid diet with bowel regimen.  Per general surgery patient worsens with nausea and emesis may consider NG tube and bowel rest.  General surgery feels patient has severe comorbidities and noted to be DNR and as such not going to be too aggressive.  Per general surgery patient is a very poor operative candidate with his heart failure renal disease, morbid obesity and history of multiple prior surgeries.  Patient started on clears and tolerated dysphagia 1 diet.  Antibiotics have been discontinued.  Patient seen by general surgery who are recommending diet to be advanced and no further recommendations from a surgical standpoint.  Patient currently on a dysphagia 3 diet/diabetic diet which he is tolerating. Per general surgery.    3.  Diabetes mellitus type 2 Hemoglobin A1c of  8.3.  Patient noted to have a blood glucose level of 68 at 2038hRS (02/04/2019) evening.  CBG of 107 this morning.  Patient noted to have nausea and emesis with some abnormal findings on CT abdomen and pelvis.  Patient started on a diet.  NPH was discontinued as patient had low blood glucose levels and was n.p.o.  CBGs increasing and as such patient placed back on NPH.  Increased NPH to 10 units twice daily and uptitrate as needed for better blood glucose control.  Continue sliding scale insulin.  4.  Chronic A. fib/a flutter/status post PPM. Patient with nausea and emesis early the morning of 02/05/2019, with abdominal distention.  Patient with no further nausea or emesis.  Patient started on a diet per general surgery.  Coreg has been resumed per cardiology and IV Lopressor discontinued.  INR therapeutic at 2.3.  Potassium at 3.3.  Patient on oral daily potassium supplementation.  Coumadin per pharmacy.    5.  Hyperlipidemia Fasting lipid panel with LDL of 49.  Continue statin.   6.  Chronic left heel wound Seems to be healing.  No drainage noted.  Wound care.  7.  Acute kidney injury on chronic kidney disease stage III Patient noted to have a slight bump in his renal function felt likely secondary to cardiorenal syndrome in the setting of diuretics and decreased oral intake.  IV diuretics have been transitioned to oral diuretics.  Renal function trending down and creatinine down to 1.95 from 2.26 from 2.49 from 2.71.  Patient not on ACE inhibitor.  Follow.    8.  Obesity  9.  Probable OSA Patient noted to be refusing CPAP during the hospitalization.  Importance of CPAP stressed to patient who agrees to use CPAP.  Patient used CPAP overnight.  Will need formal outpatient sleep study.  10.  Hypokalemia Continue oral potassium supplementation.    DVT prophylaxis: Coumadin Code Status: DNR Family Communication: Updated patient.  Disposition Plan: Likely skilled nursing facility versus  home with home health.   Pt will require SNF level care at the time of discharge.    COVID-19 testing is ordered in order to facilitate the discharge process.    It is my clinical opinion that this pt does not have any symptoms of fever, cough, shortness of breath, diarrhea or altered mental status related to COVID-19 disease.   Pt does not require any specific isolation while COVID-19 test result is pending.   Consultants:   Cardiology: Dr. Harrell Gave 02/04/2019  General surgery: Dr. Johney Maine 02/05/2019  Procedures:   2D echo 02/04/2019  CT abdomen and pelvis 02/05/2019  Chest x-Friedhoff 02/05/2019    Antimicrobials:   IV Zosyn 02/05/2019>>>>> 02/07/2019   Subjective: Patient more alert this morning.  Denies any chest pain or shortness of breath.  No abdominal pain.  No further nausea or vomiting.  Tolerating current diet.  Patient use CPAP machine overnight and feels better rested.   Objective: Vitals:   02/08/19 1430 02/08/19 1654 02/08/19 2014 02/09/19 0407  BP: 135/78 (!) 145/77 121/72 (!) 147/79  Pulse: 69 69 70 70  Resp: 18  20 18   Temp: 98.5 F (36.9 C)  99.1 F (37.3 C) 99.8 F (37.7 C)  TempSrc: Oral  Oral Oral  SpO2: 92%  95% 96%  Weight:    (!) 136.7 kg  Height:        Intake/Output Summary (Last 24 hours) at 02/09/2019 1140 Last data filed at 02/09/2019 0933 Gross per 24 hour  Intake 840 ml  Output 1000 ml  Net -160 ml   Filed Weights   02/07/19 0520 02/08/19 0502 02/09/19 0407  Weight: (!) 139.2 kg (!) 138.4 kg (!) 136.7 kg    Examination:  General exam: NAD Respiratory system: Lungs clear to auscultation bilaterally.  No wheezes, no crackles, no rhonchi.  Normal respiratory effort.   Cardiovascular system: RRR no murmurs rubs or gallops.  No JVD.  1+ pedal edema.   Gastrointestinal system: Abdomen is soft, obese, distended, positive bowel sounds, nontender to palpation.  No rebound.  No guarding.  Central nervous system: Alert and oriented.  Moving  extremities spontaneously.  Extremities: Symmetric 5 x 5 power. Skin: No rashes, lesions or ulcers Psychiatry: Judgment and insight fair.  Mood and affect appropriate.     Data Reviewed: I have personally reviewed following labs and imaging studies  CBC: Recent Labs  Lab 02/03/19 1435 02/07/19 0548 02/08/19 0507 02/09/19 0601  WBC 8.5 11.5* 13.3* 9.1  HGB 13.6 12.3* 12.3* 12.8*  HCT 43.8 41.2 39.6 40.7  MCV 102.1* 104.6* 100.5* 99.3  PLT 210 195 200 659   Basic Metabolic Panel: Recent Labs  Lab  02/03/19 1530 02/04/19 0220 02/05/19 0502 02/06/19 0513 02/06/19 1709 02/07/19 0548 02/08/19 0507 02/09/19 0601  NA  --  142 143 145  --  136 136 136  K  --  4.2 3.9 3.7  --  3.8 3.6 3.3*  CL  --  105 102 102  --  95* 92* 88*  CO2  --  29 30 33*  --  30 35* 38*  GLUCOSE  --  179* 140* 125*  --  184* 208* 141*  BUN  --  30* 33* 37*  --  36* 34* 34*  CREATININE  --  2.41* 2.49* 2.71*  --  2.49* 2.26* 1.95*  CALCIUM  --  8.2* 8.4* 8.2*  --  8.0* 8.5* 8.8*  MG 1.7 2.0 1.9  --  1.7  --   --   --    GFR: Estimated Creatinine Clearance: 53.4 mL/min (A) (by C-G formula based on SCr of 1.95 mg/dL (H)). Liver Function Tests: Recent Labs  Lab 02/03/19 1435  AST 12*  ALT 10  ALKPHOS 33*  BILITOT 0.8  PROT 6.4*  ALBUMIN 2.7*   No results for input(s): LIPASE, AMYLASE in the last 168 hours. Recent Labs  Lab 02/06/19 1059  AMMONIA 19   Coagulation Profile: Recent Labs  Lab 02/05/19 0502 02/06/19 0513 02/07/19 0548 02/08/19 0507 02/09/19 0601  INR 2.6* 2.5* 2.4* 2.6* 2.3*   Cardiac Enzymes: Recent Labs  Lab 02/03/19 1435 02/03/19 2040 02/04/19 0220 02/04/19 0956  TROPONINI <0.03 <0.03 <0.03 <0.03   BNP (last 3 results) No results for input(s): PROBNP in the last 8760 hours. HbA1C: No results for input(s): HGBA1C in the last 72 hours. CBG: Recent Labs  Lab 02/08/19 2018 02/08/19 2358 02/09/19 0404 02/09/19 0721 02/09/19 1133  GLUCAP 342* 226* 151*  146* 207*   Lipid Profile: No results for input(s): CHOL, HDL, LDLCALC, TRIG, CHOLHDL, LDLDIRECT in the last 72 hours. Thyroid Function Tests: No results for input(s): TSH, T4TOTAL, FREET4, T3FREE, THYROIDAB in the last 72 hours. Anemia Panel: No results for input(s): VITAMINB12, FOLATE, FERRITIN, TIBC, IRON, RETICCTPCT in the last 72 hours. Sepsis Labs: Recent Labs  Lab 02/05/19 9977  LATICACIDVEN 1.1    Recent Results (from the past 240 hour(s))  SARS Coronavirus 2 [facility] order, Performed in [facility] hospital lab)     Status: None   Collection Time: 02/03/19  3:27 PM  Result Value Ref Range Status   SARS Coronavirus 2 NEGATIVE NEGATIVE Final    Comment: (NOTE) If result is NEGATIVE SARS-CoV-2 target nucleic acids are NOT DETECTED. The SARS-CoV-2 RNA is generally detectable in upper and lower  respiratory specimens during the acute phase of infection. The lowest  concentration of SARS-CoV-2 viral copies this assay can detect is 250  copies / mL. A negative result does not preclude SARS-CoV-2 infection  and should not be used as the sole basis for treatment or other  patient management decisions.  A negative result may occur with  improper specimen collection / handling, submission of specimen other  than nasopharyngeal swab, presence of viral mutation(s) within the  areas targeted by this assay, and inadequate number of viral copies  (<250 copies / mL). A negative result must be combined with clinical  observations, patient history, and epidemiological information. If result is POSITIVE SARS-CoV-2 target nucleic acids are DETECTED. The SARS-CoV-2 RNA is generally detectable in upper and lower  respiratory specimens dur ing the acute phase of infection.  Positive  results are indicative of  active infection with SARS-CoV-2.  Clinical  correlation with patient history and other diagnostic information is  necessary to determine patient infection status.  Positive results  do  not rule out bacterial infection or co-infection with other viruses. If result is PRESUMPTIVE POSTIVE SARS-CoV-2 nucleic acids MAY BE PRESENT.   A presumptive positive result was obtained on the submitted specimen  and confirmed on repeat testing.  While 2019 novel coronavirus  (SARS-CoV-2) nucleic acids may be present in the submitted sample  additional confirmatory testing may be necessary for epidemiological  and / or clinical management purposes  to differentiate between  SARS-CoV-2 and other Sarbecovirus currently known to infect humans.  If clinically indicated additional testing with an alternate test  methodology [phone]) is advised. The SARS-CoV-2 RNA is generally  detectable in upper and lower respiratory sp ecimens during the acute  phase of infection. The expected result is Negative. Fact Sheet for Patients:  StrictlyIdeas.no Fact Sheet for Healthcare Providers: BankingDealers.co.za This test is not yet approved or cleared by the [place] FDA and has been authorized for detection and/or diagnosis of SARS-CoV-2 by FDA under an Emergency Use Authorization (EUA).  This EUA will remain in effect (meaning this test can be used) for the duration of the COVID-19 declaration under Section 564(b)(1) of the Act, 21 U.S.C. section 360bbb-3(b)(1), unless the authorization is terminated or revoked sooner. Performed at [facility], [place] [address] [place], [place] [address]   Culture, Urine     Status: Abnormal   Collection Time: 02/03/19  6:59 PM  Result Value Ref Range Status   Specimen Description   Final    URINE, CLEAN CATCH Performed at [facility], [place] [address], [place], [place] [address]    Special Requests   Final    NONE Performed at [facility], [place] [address], [place], [place] [address]    Culture >=100,000 COLONIES/mL PROTEUS MIRABILIS (A)  Final    Report Status 02/07/2019 FINAL  Final   Organism ID, Bacteria PROTEUS MIRABILIS (A)  Final      Susceptibility   Proteus mirabilis - MIC*    AMPICILLIN <=2 SENSITIVE Sensitive     CEFAZOLIN <=4 SENSITIVE Sensitive     CEFTRIAXONE <=1 SENSITIVE Sensitive     CIPROFLOXACIN >=4 RESISTANT Resistant     GENTAMICIN <=1 SENSITIVE Sensitive     IMIPENEM 4 SENSITIVE Sensitive     NITROFURANTOIN 128 RESISTANT Resistant     TRIMETH/SULFA <=20 SENSITIVE Sensitive     AMPICILLIN/SULBACTAM <=2 SENSITIVE Sensitive     PIP/TAZO <=4 SENSITIVE Sensitive     * >=100,000 COLONIES/mL PROTEUS MIRABILIS  MRSA PCR Screening     Status: Abnormal   Collection Time: 02/04/19  9:38 AM  Result Value Ref Range Status   MRSA by PCR POSITIVE (A) NEGATIVE Final    Comment:        The GeneXpert MRSA Assay (FDA approved for NASAL specimens only), is one component of a comprehensive MRSA colonization surveillance program. It is not intended to diagnose MRSA infection nor to guide or monitor treatment for MRSA infections. RESULT CALLED TO, READ BACK BY AND VERIFIED WITH: Tyler Aas [id number] @ [address] Performed at [place] [address] [place], [place] [address]          Radiology Studies: No results found.      Scheduled Meds:  carvedilol  25 mg Oral BID WC   Chlorhexidine Gluconate Cloth  6 each Topical Q0600   furosemide  80 mg Oral Daily   gabapentin  300 mg Oral TID   hydrALAZINE  10 mg Oral TID   insulin aspart  0-5 Units Subcutaneous Q4H   insulin NPH Human  10 Units Subcutaneous BID WC   isosorbide dinitrate  5 mg Oral TID   mouth rinse  15 mL Mouth Rinse BID   metolazone  2.5 mg Oral Daily   pantoprazole  40 mg Oral Daily   potassium chloride SA  20 mEq Oral BID   psyllium  1 packet Oral BID   rosuvastatin  10 mg Oral Daily   saccharomyces boulardii  250 mg Oral BID   sodium chloride flush  3 mL Intravenous Q12H   spironolactone   25 mg Oral BID   warfarin  7.5 mg Oral q1800   Warfarin - Pharmacist Dosing Inpatient   Does not apply q1800   Continuous Infusions:  sodium chloride       LOS: 6 days    Time spent: 40 minutes    Irine Seal, MD [facility]  If 7PM-7AM, please contact night-coverage [email or web address] 02/09/2019, 11:40 AM

## 2019-02-09 NOTE — Progress Notes (Signed)
Kurt Cross for warfarin Indication: Afib/flutter  No Known Allergies  Patient Measurements: Height: 5\' 11"  (180.3 cm) Weight: (!) 301 lb 5.9 oz (136.7 kg) IBW/kg (Calculated) : 75.3  Vital Signs: Temp: 99.8 F (37.7 C) (05/05 0407) Temp Source: Oral (05/05 0407) BP: 147/79 (05/05 0407) Pulse Rate: 70 (05/05 0407)  Labs: Recent Labs    02/07/19 0548 02/08/19 0507 02/09/19 0601  HGB 12.3* 12.3* 12.8*  HCT 41.2 39.6 40.7  PLT 195 200 193  LABPROT 25.5* 27.3* 25.2*  INR 2.4* 2.6* 2.3*  CREATININE 2.49* 2.26* 1.95*    Estimated Creatinine Clearance: 53.4 mL/min (A) (by C-G formula based on SCr of 1.95 mg/dL (H)).  Assessment: 57 yoM with PMH sCHF, Afib/flutter on warfarin, AAA, CKD3, complete HB s/p pacer, DM2, admitted for AECHF. Pharmacy to continue warfarin.   Baseline INR 2.7  Prior anticoagulation: warfarin 7.5 mg daily, LD 4/29 - some confusion about last dose since patient has history of noncompliance with other meds, is currently altered and changed his story during the admission. However, given INR 2.7, d/w TR[facility] MD and felt reasonably confident that dose was taken today  Significant events: 5/1 unable to tolerate POs and missed warfarin dose.  Today, 02/09/2019:  INR remains therapeutic at 2.3  CBC: Hgb slightly low, Plt WNL  Major drug interactions: none currently  No bleeding issues per notes  Diet as tolerated, no current plans for surgery  Goal of Therapy: INR 2-3  Plan:  contiue PTA dose of 7.5 mg daily  Monitor for signs of bleeding or thrombosis  DC Daily INR, next INR Thursday 5/7   Eudelia Bunch, Pharm.D 02/09/2019 1:07 PM

## 2019-02-10 LAB — CBC WITH DIFFERENTIAL/PLATELET
Abs Immature Granulocytes: 0.05 10*3/uL (ref 0.00–0.07)
Basophils Absolute: 0 10*3/uL (ref 0.0–0.1)
Basophils Relative: 0 %
Eosinophils Absolute: 0.2 10*3/uL (ref 0.0–0.5)
Eosinophils Relative: 2 %
HCT: 41.1 % (ref 39.0–52.0)
Hemoglobin: 13.1 g/dL (ref 13.0–17.0)
Immature Granulocytes: 1 %
Lymphocytes Relative: 18 %
Lymphs Abs: 1.7 10*3/uL (ref 0.7–4.0)
MCH: 32.1 pg (ref 26.0–34.0)
MCHC: 31.9 g/dL (ref 30.0–36.0)
MCV: 100.7 fL — ABNORMAL HIGH (ref 80.0–100.0)
Monocytes Absolute: 0.9 10*3/uL (ref 0.1–1.0)
Monocytes Relative: 9 %
Neutro Abs: 6.5 10*3/uL (ref 1.7–7.7)
Neutrophils Relative %: 70 %
Platelets: 179 10*3/uL (ref 150–400)
RBC: 4.08 MIL/uL — ABNORMAL LOW (ref 4.22–5.81)
RDW: 16.1 % — ABNORMAL HIGH (ref 11.5–15.5)
WBC: 9.3 10*3/uL (ref 4.0–10.5)
nRBC: 0 % (ref 0.0–0.2)

## 2019-02-10 LAB — BASIC METABOLIC PANEL
Anion gap: 11 (ref 5–15)
BUN: 33 mg/dL — ABNORMAL HIGH (ref 8–23)
CO2: 36 mmol/L — ABNORMAL HIGH (ref 22–32)
Calcium: 8.7 mg/dL — ABNORMAL LOW (ref 8.9–10.3)
Chloride: 87 mmol/L — ABNORMAL LOW (ref 98–111)
Creatinine, Ser: 1.95 mg/dL — ABNORMAL HIGH (ref 0.61–1.24)
GFR calc Af Amer: 41 mL/min — ABNORMAL LOW (ref >60)
GFR calc non Af Amer: 35 mL/min — ABNORMAL LOW (ref >60)
Glucose, Bld: 230 mg/dL — ABNORMAL HIGH (ref 70–99)
Potassium: 3.7 mmol/L (ref 3.5–5.1)
Sodium: 134 mmol/L — ABNORMAL LOW (ref 135–145)

## 2019-02-10 LAB — MAGNESIUM: Magnesium: 1.6 mg/dL — ABNORMAL LOW (ref 1.7–2.4)

## 2019-02-10 LAB — GLUCOSE, CAPILLARY
Glucose-Capillary: 103 mg/dL — ABNORMAL HIGH (ref 70–99)
Glucose-Capillary: 186 mg/dL — ABNORMAL HIGH (ref 70–99)
Glucose-Capillary: 206 mg/dL — ABNORMAL HIGH (ref 70–99)
Glucose-Capillary: 220 mg/dL — ABNORMAL HIGH (ref 70–99)
Glucose-Capillary: 225 mg/dL — ABNORMAL HIGH (ref 70–99)
Glucose-Capillary: 293 mg/dL — ABNORMAL HIGH (ref 70–99)
Glucose-Capillary: 322 mg/dL — ABNORMAL HIGH (ref 70–99)
Glucose-Capillary: 470 mg/dL — ABNORMAL HIGH (ref 70–99)

## 2019-02-10 LAB — PROTIME-INR
INR: 2.1 — ABNORMAL HIGH (ref 0.8–1.2)
Prothrombin Time: 23.1 seconds — ABNORMAL HIGH (ref 11.4–15.2)

## 2019-02-10 MED ORDER — INSULIN ASPART 100 UNIT/ML ~~LOC~~ SOLN
20.0000 [IU] | Freq: Once | SUBCUTANEOUS | Status: AC
  Administered 2019-02-10: 20 [IU] via SUBCUTANEOUS

## 2019-02-10 MED ORDER — FUROSEMIDE 40 MG PO TABS
60.0000 mg | ORAL_TABLET | Freq: Every day | ORAL | Status: DC
  Administered 2019-02-10 – 2019-02-12 (×3): 60 mg via ORAL
  Filled 2019-02-10 (×3): qty 1

## 2019-02-10 NOTE — Progress Notes (Signed)
Occupational Therapy Treatment Patient Details Name: STANLEY LYNESS MRN: [id number] DOB: 05/19/1951 Today's Date: 02/10/2019    History of present illness 68 year old man was admitted to ED on 4/29 with SOB/CHF exacerbation.  PMH:  AAA, CKD, Heart block with pacemaker, DM, and chronic L heel wound   OT comments  Pt using red built up handle with spoon upon OT arrival  Follow Up Recommendations  SNF    Equipment Recommendations  None recommended by OT    Recommendations for Other Services      Precautions / Restrictions Precautions Precautions: Fall;ICD/Pacemaker Restrictions Weight Bearing Restrictions: No Other Position/Activity Restrictions: decreased tolerance of WB on RUE       Mobility Bed Mobility Overal bed mobility: Needs Assistance Bed Mobility: Supine to Sit;Sit to Supine     Supine to sit: Mod assist Sit to supine: Mod assist   General bed mobility comments: use of bed rail and HOB raised.  Assist for bil LEs, pt unable to tolerate knee extension bilaterally so increased hip/knee flexion with bed mobility  Transfers                 General transfer comment: NT for safety (would need +2 for transfer board and positioning safety)    Balance Overall balance assessment: Needs assistance Sitting-balance support: Feet supported;Single extremity supported Sitting balance-Leahy Scale: Fair Sitting balance - Comments: pt able to perform sitting EOB without UE support however tends to prop R arm on bed rail                                   ADL either performed or assessed with clinical judgement   ADL Overall ADL's : Needs assistance/impaired     Grooming: Set up;Wash/dry face;Bed level                                       Vision Patient Visual Report: No change from baseline            Cognition Arousal/Alertness: Awake/alert Behavior During Therapy: WFL for tasks assessed/performed Overall Cognitive Status:  Within Functional Limits for tasks assessed                                          Exercises General Exercises - Upper Extremity Shoulder Flexion: AROM;Both;20 reps;Supine Shoulder Extension: AROM;Both;20 reps;Supine Shoulder ADduction: AROM;10 reps;Supine;Both Elbow Flexion: AROM;Both;Supine Elbow Extension: AROM;Both;Supine;20 reps            Pertinent Vitals/ Pain       Pain Assessment: No/denies pain Pain Intervention(s): Monitored during session      Frequency  Min 2X/week        Progress Toward Goals  OT Goals(current goals can now be found in the care plan section)  Progress towards OT goals: Progressing toward goals     Plan Discharge plan remains appropriate       AM-PAC OT "6 Clicks" Daily Activity     Outcome Measure   Help from another person eating meals?: A Little Help from another person taking care of personal grooming?: A Little Help from another person toileting, which includes using toliet, bedpan, or urinal?: A Lot Help from another person bathing (including washing, rinsing, drying)?: A Lot  Help from another person to put on and taking off regular upper body clothing?: A Lot Help from another person to put on and taking off regular lower body clothing?: A Lot 6 Click Score: 14    End of Session    OT Visit Diagnosis: Muscle weakness (generalized) (M62.81)   Activity Tolerance Patient tolerated treatment well   Patient Left in bed;with call bell/phone within reach;with bed alarm set   Nurse Communication          Time: 1434-1450 OT Time Calculation (min): 16 min  Charges: OT General Charges $OT Visit: 1 Visit OT Treatments $Therapeutic Activity: 8-22 mins  Kari Baars, [place] [phone] Office- [phone]      Ilham Roughton, Edwena Felty D 02/10/2019, 3:33 PM

## 2019-02-10 NOTE — Progress Notes (Signed)
PROGRESS NOTE    Kurt Cross  HKV:[id number] DOB: 07/19/1951 DOA: 02/03/2019 PCP: [name], MD   Brief Narrative:  Kurt Cross is a 68 y.o. male with medical history significant of chronic systolic heart failure, A. fib/a flutter, AAA, chronic kidney disease stage III, complete heart block status post PPM, chronic constipation, type 2 diabetes, chronic heel wound who presents to the ED with a one-week history of worsening shortness of breath and worsening lower extremity edema.  Patient states lower extremity edema has been ongoing for greater than 1 week.  Patient also endorses a 8 to 10 pound weight gain over the past 2 weeks.  Patient denies any fevers, no chest pain, no nausea, no cough, no melena, no hematemesis, no hematochezia.  Patient does endorse a bout of emesis 1 week prior to admission.  Patient denies any diarrhea, no constipation.  Patient does endorse some soft stools.  Patient denies any abdominal pain.  No syncopal episodes, no dietary indiscretions.  Patient does endorse some dysuria.  Patient is noted in his chart has a history of medical noncompliance patient states has missed 1 weeks worth of his Crestor and furosemide stating he ran out of his medications.  Patient also endorses decreased oral intake and some insomnia.  Patient stated that his wife made him come to the ED.  ED Course: Patient seen in the ED, comprehensive metabolic profile with a glucose of 263, BUN of 31, creatinine of 2.33, albumin of 2.7 otherwise was within normal limits.  BNP of 194.2.  Troponin was negative.  CBC unremarkable.  INR of 2.7.  COVID-19 was negative.  Chest x-Cale done with chronic cardiomegaly, new slight pulmonary vascular prominence with probable posterior pleural effusions.  Plain films of the left foot negative for any acute abnormalities.  EKG done with a paced rhythm.  Patient given a dose of Lasix 80 mg IV x1.  Hospitalist were called to admit the patient for further  evaluation and management.    Assessment & Plan:   Principal Problem:   Acute exacerbation of CHF (congestive heart failure) (HCC) Active Problems:   Diabetes mellitus, insulin dependent (IDDM), uncontrolled (HCC)   Hyperlipidemia   Obesity, Class III, BMI 40-49.9 (morbid obesity) ([place])   Obstructive sleep apnea   Essential hypertension   Chronic combined systolic and diastolic CHF (congestive heart failure) (HCC)   Thoracic aortic aneurysm (HCC)   Atrial fibrillation/flutter   Debility   S/P placement of cardiac pacemaker   Chronic anticoagulation   CKD (chronic kidney disease) stage 4, GFR 15-29 ml/min (HCC)   Diabetic ulcer of ankle associated with type 2 diabetes mellitus, limited to breakdown of skin (HCC)   Pneumatosis of gastric wall   Nausea & vomiting  1. acute on chronic systolic heart failure exacerbation 2/2 noncompliance and running out of Lasix -6.1 L so far for admission-weight is inaccurate Patient is developing alkalosis therefore I cut back his Lasix 80 IV daily to 60 IV daily and we will transition to p.o. in the next 24 to 48 hours Echo EF 35-40%-cardiology saw recommended resumption Zaroxolyn, Aldactone, Coreg and is now on oral diuretics  2.  Nausea and vomiting/gastric pneumatosis/colonic ileus General surgery saw patient and signed off on 5/3 reconsult if as needed  3.  Diabetes mellitus type 2, A1c 8.3 Continue management with NPH 10 units twice daily and every 4 sliding scale coverage-blood sugars 225 2-93 and will adjust in a.m. if needed He is eating 100% of meals  4.  Chronic A. fib/a flutter/status post PPM--sick sinus syndrome-on Coumadin Continue meds as above INR therapeutic  5.  Hyperlipidemia Fasting lipid panel with LDL of 49.  Continue statin.   6.  Chronic left heel wound Seems to be healing.  No drainage noted.  Wound care.  7.    Acute kidney injury on chronic kidney disease stage III Patient noted to have a slight bump  in his renal function felt likely secondary to cardiorenal syndrome in the setting of diuretics and decreased oral intake.  IV diuretics have been transitioned to oral diuretics.  Renal function trending down and creatinine down to 1.95 from 2.26 from 2.49 from 2.71.  Patient not on ACE inhibitor.  Follow.    8.  Obesity  9.  Probable OSA  Patient used CPAP overnight.  Will need formal outpatient sleep study.  10.  Hypokalemia Continue oral potassium supplementation.    DVT prophylaxis: Coumadin Code Status: DNR Family Communication: Updated patient alone-we will update family soon Disposition Plan: Likely skilled nursing facility in the next several days   Pt will require SNF level care at the time of discharge.    COVID-19 testing is ordered in order to facilitate the discharge process.    It is my clinical opinion that this pt does not have any symptoms of fever, cough, shortness of breath, diarrhea or altered mental status related to COVID-19 disease.   Pt does not require any specific isolation while COVID-19 test result is pending.   Consultants:   Cardiology: Dr. Harrell Gave 02/04/2019  General surgery: Dr. Johney Maine 02/05/2019  Procedures:   2D echo 02/04/2019  CT abdomen and pelvis 02/05/2019  Chest x-Nissen 02/05/2019    Antimicrobials:   IV Zosyn 02/05/2019>>>>> 02/07/2019   Subjective:  Awake coherent sitting up halfway in the bed no chest pain no fever Eating very well Has been passing more urine and does not feel constipated or swollen  Objective: Vitals:   02/09/19 2250 02/10/19 0539 02/10/19 1100 02/10/19 1331  BP:  115/69  118/69  Pulse: 69 70  74  Resp: 20 18    Temp:  98.6 F (37 C)  98.7 F (37.1 C)  TempSrc:  Oral  Oral  SpO2: 98% 96% 98% 98%  Weight:  133.1 kg    Height:        Intake/Output Summary (Last 24 hours) at 02/10/2019 1535 Last data filed at 02/10/2019 1040 Gross per 24 hour  Intake 960 ml  Output 1950 ml  Net -990 ml   Filed  Weights   02/08/19 0502 02/09/19 0407 02/10/19 0539  Weight: (!) 138.4 kg (!) 136.7 kg 133.1 kg    Examination:  Alert coherent Mallampati 4 no JVD No submandibular lymphadenopathy S1-S2 no murmur sinus rhythm on telemetry therefore telemetry discontinued Chest clinically clear with scattered rales posterolaterally poor air entry Abdomen soft distended obese no hepatosplenomegaly no rebound no guarding Lower extremity edema grade 1 Neurologically intact no focal deficit moving 4 limbs equally smile symmetric Psych euthymic    Data Reviewed: I have personally reviewed following labs and imaging studies  CBC: Recent Labs  Lab 02/07/19 0548 02/08/19 0507 02/09/19 0601 02/10/19 0545  WBC 11.5* 13.3* 9.1 9.3  NEUTROABS  --   --   --  6.5  HGB 12.3* 12.3* 12.8* 13.1  HCT 41.2 39.6 40.7 41.1  MCV 104.6* 100.5* 99.3 100.7*  PLT 195 200 193 433   Basic Metabolic Panel: Recent Labs  Lab 02/04/19 0220 02/05/19 0502  02/06/19 5102 02/06/19 1709 02/07/19 0548 02/08/19 0507 02/09/19 0601 02/10/19 0545  NA 142 143 145  --  136 136 136 134*  K 4.2 3.9 3.7  --  3.8 3.6 3.3* 3.7  CL 105 102 102  --  95* 92* 88* 87*  CO2 29 30 33*  --  30 35* 38* 36*  GLUCOSE 179* 140* 125*  --  184* 208* 141* 230*  BUN 30* 33* 37*  --  36* 34* 34* 33*  CREATININE 2.41* 2.49* 2.71*  --  2.49* 2.26* 1.95* 1.95*  CALCIUM 8.2* 8.4* 8.2*  --  8.0* 8.5* 8.8* 8.7*  MG 2.0 1.9  --  1.7  --   --   --  1.6*   GFR: Estimated Creatinine Clearance: 52.6 mL/min (A) (by C-G formula based on SCr of 1.95 mg/dL (H)). Liver Function Tests: No results for input(s): AST, ALT, ALKPHOS, BILITOT, PROT, ALBUMIN in the last 168 hours. No results for input(s): LIPASE, AMYLASE in the last 168 hours. Recent Labs  Lab 02/06/19 1059  AMMONIA 19   Coagulation Profile: Recent Labs  Lab 02/06/19 0513 02/07/19 0548 02/08/19 0507 02/09/19 0601 02/10/19 0545  INR 2.5* 2.4* 2.6* 2.3* 2.1*   Cardiac Enzymes: Recent  Labs  Lab 02/03/19 2040 02/04/19 0220 02/04/19 0956  TROPONINI <0.03 <0.03 <0.03   BNP (last 3 results) No results for input(s): PROBNP in the last 8760 hours. HbA1C: No results for input(s): HGBA1C in the last 72 hours. CBG: Recent Labs  Lab 02/09/19 2004 02/10/19 0020 02/10/19 0535 02/10/19 0758 02/10/19 1152  GLUCAP 254* 206* 225* 186* 293*   Lipid Profile: No results for input(s): CHOL, HDL, LDLCALC, TRIG, CHOLHDL, LDLDIRECT in the last 72 hours. Thyroid Function Tests: No results for input(s): TSH, T4TOTAL, FREET4, T3FREE, THYROIDAB in the last 72 hours. Anemia Panel: No results for input(s): VITAMINB12, FOLATE, FERRITIN, TIBC, IRON, RETICCTPCT in the last 72 hours. Sepsis Labs: Recent Labs  Lab 02/05/19 5852  LATICACIDVEN 1.1    Recent Results (from the past 240 hour(s))  SARS Coronavirus 2 [facility] order, Performed in [facility] hospital lab)     Status: None   Collection Time: 02/03/19  3:27 PM  Result Value Ref Range Status   SARS Coronavirus 2 NEGATIVE NEGATIVE Final    Comment: (NOTE) If result is NEGATIVE SARS-CoV-2 target nucleic acids are NOT DETECTED. The SARS-CoV-2 RNA is generally detectable in upper and lower  respiratory specimens during the acute phase of infection. The lowest  concentration of SARS-CoV-2 viral copies this assay can detect is 250  copies / mL. A negative result does not preclude SARS-CoV-2 infection  and should not be used as the sole basis for treatment or other  patient management decisions.  A negative result may occur with  improper specimen collection / handling, submission of specimen other  than nasopharyngeal swab, presence of viral mutation(s) within the  areas targeted by this assay, and inadequate number of viral copies  (<250 copies / mL). A negative result must be combined with clinical  observations, patient history, and epidemiological information. If result is POSITIVE SARS-CoV-2 target nucleic acids are  DETECTED. The SARS-CoV-2 RNA is generally detectable in upper and lower  respiratory specimens dur ing the acute phase of infection.  Positive  results are indicative of active infection with SARS-CoV-2.  Clinical  correlation with patient history and other diagnostic information is  necessary to determine patient infection status.  Positive results do  not rule out bacterial infection  or co-infection with other viruses. If result is PRESUMPTIVE POSTIVE SARS-CoV-2 nucleic acids MAY BE PRESENT.   A presumptive positive result was obtained on the submitted specimen  and confirmed on repeat testing.  While 2019 novel coronavirus  (SARS-CoV-2) nucleic acids may be present in the submitted sample  additional confirmatory testing may be necessary for epidemiological  and / or clinical management purposes  to differentiate between  SARS-CoV-2 and other Sarbecovirus currently known to infect humans.  If clinically indicated additional testing with an alternate test  methodology [phone]) is advised. The SARS-CoV-2 RNA is generally  detectable in upper and lower respiratory sp ecimens during the acute  phase of infection. The expected result is Negative. Fact Sheet for Patients:  StrictlyIdeas.no Fact Sheet for Healthcare Providers: BankingDealers.co.za This test is not yet approved or cleared by the [place] FDA and has been authorized for detection and/or diagnosis of SARS-CoV-2 by FDA under an Emergency Use Authorization (EUA).  This EUA will remain in effect (meaning this test can be used) for the duration of the COVID-19 declaration under Section 564(b)(1) of the Act, 21 U.S.C. section 360bbb-3(b)(1), unless the authorization is terminated or revoked sooner. Performed at [facility], [place] [address] [place], [place] [address]   Culture, Urine     Status: Abnormal   Collection Time: 02/03/19  6:59 PM  Result Value  Ref Range Status   Specimen Description   Final    URINE, CLEAN CATCH Performed at [facility], [place] [address] [place], [place] [address]    Special Requests   Final    NONE Performed at [facility], [place] [address] [place], [place] [address]    Culture >=100,000 COLONIES/mL PROTEUS MIRABILIS (A)  Final   Report Status 02/07/2019 FINAL  Final   Organism ID, Bacteria PROTEUS MIRABILIS (A)  Final      Susceptibility   Proteus mirabilis - MIC*    AMPICILLIN <=2 SENSITIVE Sensitive     CEFAZOLIN <=4 SENSITIVE Sensitive     CEFTRIAXONE <=1 SENSITIVE Sensitive     CIPROFLOXACIN >=4 RESISTANT Resistant     GENTAMICIN <=1 SENSITIVE Sensitive     IMIPENEM 4 SENSITIVE Sensitive     NITROFURANTOIN 128 RESISTANT Resistant     TRIMETH/SULFA <=20 SENSITIVE Sensitive     AMPICILLIN/SULBACTAM <=2 SENSITIVE Sensitive     PIP/TAZO <=4 SENSITIVE Sensitive     * >=100,000 COLONIES/mL PROTEUS MIRABILIS  MRSA PCR Screening     Status: Abnormal   Collection Time: 02/04/19  9:38 AM  Result Value Ref Range Status   MRSA by PCR POSITIVE (A) NEGATIVE Final    Comment:        The GeneXpert MRSA Assay (FDA approved for NASAL specimens only), is one component of a comprehensive MRSA colonization surveillance program. It is not intended to diagnose MRSA infection nor to guide or monitor treatment for MRSA infections. RESULT CALLED TO, READ BACK BY AND VERIFIED WITH: Tyler Aas [id number] @ [address] Performed at [place] [address], [place], [place] [address]          Radiology Studies: No results found.      Scheduled Meds: . carvedilol  25 mg Oral BID WC  . furosemide  60 mg Oral Daily  . gabapentin  300 mg Oral TID  . hydrALAZINE  10 mg Oral TID  . insulin aspart  0-5 Units Subcutaneous Q4H  . insulin NPH Human  10  Units Subcutaneous BID WC  . isosorbide dinitrate  5 mg Oral TID  . mouth rinse  15 mL Mouth  Rinse BID  . metolazone  2.5 mg Oral Daily  . pantoprazole  40 mg Oral Daily  . potassium chloride SA  20 mEq Oral BID  . psyllium  1 packet Oral BID  . rosuvastatin  10 mg Oral Daily  . saccharomyces boulardii  250 mg Oral BID  . sodium chloride flush  3 mL Intravenous Q12H  . spironolactone  25 mg Oral BID  . warfarin  7.5 mg Oral q1800  . Warfarin - Pharmacist Dosing Inpatient   Does not apply q1800   Continuous Infusions: . sodium chloride       LOS: 7 days    Time spent: 40 minutes  Verneita Griffes, MD [facility] 3:35 PM

## 2019-02-10 NOTE — Consult Note (Signed)
   [facility] Inpatient Consult   02/10/2019  KAIREE ISA [birth year] [id number]   [facility] Follow-up:  Per chart review, current disposition plan is for SNF, [facility]. No [facility] identifiable needs at this time.  Netta Cedars, MSN, [facility] Liaison Nurse Mobile Phone [phone]  Toll free office [phone]

## 2019-02-10 NOTE — Progress Notes (Signed)
Physical Therapy Treatment Patient Details Name: Kurt Cross MRN: [id number] DOB: [birth year] Today's Date: 02/10/2019    History of Present Illness 68 year old man was admitted to ED on 4/29 with SOB/CHF exacerbation.  PMH:  AAA, CKD, Heart block with pacemaker, DM, and chronic L heel wound    PT Comments    Pt appears more awake/alert this visit compared to last PT session.  Pt assisted to sitting. Pt presents with increased knee flexion (?contractures) and unable to perform active knee extension as well as does not tolerate attempt at PROM.  Pt sat EOB for 15 minutes with upright posture and performed LE exercises in sitting.  Pt requiring assist for bed mobility and repositioning at this time so continue to recommend SNF upon d/c.   Follow Up Recommendations  Supervision/Assistance - 24 hour;SNF     Equipment Recommendations  None recommended by PT    Recommendations for Other Services       Precautions / Restrictions Precautions Precautions: Fall;ICD/Pacemaker Restrictions Weight Bearing Restrictions: No    Mobility  Bed Mobility Overal bed mobility: Needs Assistance Bed Mobility: Supine to Sit;Sit to Supine     Supine to sit: Mod assist Sit to supine: Mod assist   General bed mobility comments: use of bed rail and HOB raised.  Assist for bil LEs, pt unable to tolerate knee extension bilaterally so increased hip/knee flexion with bed mobility  Transfers                 General transfer comment: NT for safety (would need +2 for transfer board and positioning safety)  Ambulation/Gait                 Stairs             Wheelchair Mobility    Modified Rankin (Stroke Patients Only)       Balance Overall balance assessment: Needs assistance Sitting-balance support: Feet supported;Single extremity supported Sitting balance-Leahy Scale: Fair Sitting balance - Comments: pt able to perform sitting EOB without UE support however tends to prop R  arm on bed rail                                    Cognition Arousal/Alertness: Awake/alert Behavior During Therapy: WFL for tasks assessed/performed Overall Cognitive Status: Within Functional Limits for tasks assessed                                        Exercises General Exercises - Lower Extremity Heel Slides: AAROM;Both;10 reps;Seated Hip ABduction/ADduction: AROM;10 reps;Both;Seated Hip Flexion/Marching: AROM;15 reps;Both;Seated Heel Raises: Seated;AROM;Both;10 reps    General Comments        Pertinent Vitals/Pain Pain Assessment: No/denies pain Pain Intervention(s): Monitored during session    Home Living                      Prior Function            PT Goals (current goals can now be found in the care plan section) Progress towards PT goals: Progressing toward goals    Frequency    Min 3X/week      PT Plan Discharge plan needs to be updated    Co-evaluation              AM-PAC PT "  6 Clicks" Mobility   Outcome Measure  Help needed turning from your back to your side while in a flat bed without using bedrails?: Total Help needed moving from lying on your back to sitting on the side of a flat bed without using bedrails?: Total Help needed moving to and from a bed to a chair (including a wheelchair)?: Total Help needed standing up from a chair using your arms (e.g., wheelchair or bedside chair)?: Total Help needed to walk in hospital room?: Total Help needed climbing 3-5 steps with a railing? : Total 6 Click Score: 6    End of Session Equipment Utilized During Treatment: Oxygen(remained on O2 , SPO2 95% ) Activity Tolerance: Patient tolerated treatment well Patient left: in bed;with call bell/phone within reach;with bed alarm set   PT Visit Diagnosis: Other abnormalities of gait and mobility (R26.89);Muscle weakness (generalized) (M62.81)     Time: 4383-7793 PT Time Calculation (min) (ACUTE  ONLY): 24 min  Charges:  $Therapeutic Exercise: 8-22 mins $Therapeutic Activity: 8-22 mins                     Carmelia Bake, PT, DPT Acute Rehabilitation Services Office: [phone] Pager: [phone]  Trena Platt 02/10/2019, 12:59 PM

## 2019-02-10 NOTE — Progress Notes (Signed)
Pt. placed on CPAP for h/s, humidifier refilled with s/w, placed 3 lpm oxygen into circuit, tolerating well.

## 2019-02-10 NOTE — Progress Notes (Signed)
[place] for warfarin Indication: Afib/flutter  No Known Allergies  Patient Measurements: Height: 5\' 11"  (180.3 cm) Weight: 293 lb 6.9 oz (133.1 kg) IBW/kg (Calculated) : 75.3  Vital Signs: Temp: 98.6 F (37 C) (05/06 0539) Temp Source: Oral (05/06 0539) BP: 115/69 (05/06 0539) Pulse Rate: 70 (05/06 0539)  Labs: Recent Labs    02/08/19 0507 02/09/19 0601 02/10/19 0545  HGB 12.3* 12.8* 13.1  HCT 39.6 40.7 41.1  PLT 200 193 179  LABPROT 27.3* 25.2* 23.1*  INR 2.6* 2.3* 2.1*  CREATININE 2.26* 1.95* 1.95*    Estimated Creatinine Clearance: 52.6 mL/min (A) (by C-G formula based on SCr of 1.95 mg/dL (H)).  Assessment: 63 yoM with PMH sCHF, Afib/flutter on warfarin, AAA, CKD3, complete HB s/p pacer, DM2, admitted for AECHF. Pharmacy to continue warfarin.   Baseline INR 2.7  Prior anticoagulation: warfarin 7.5 mg daily, LD 4/29 - some confusion about last dose since patient has history of noncompliance with other meds, is currently altered and changed his story during the admission. However, given INR 2.7, d/w TRH MD and felt reasonably confident that dose was taken today  Significant events: 5/1 unable to tolerate POs and missed warfarin dose.  Today, 02/10/2019:  INR remains therapeutic at 2.1. Daily INR was dc'd yesterday but drawn today anyway  CBC: Hgb slightly low, Plt WNL  Major drug interactions: none currently  No bleeding issues per notes  Diet as tolerated, no current plans for surgery  Goal of Therapy: INR 2-3  Plan:  continue PTA dose of 7.5 mg daily  Monitor for signs of bleeding or thrombosis  next INR Friday 5/8   Eudelia Bunch, Pharm.D 02/10/2019 10:50 AM

## 2019-02-11 LAB — GLUCOSE, CAPILLARY
Glucose-Capillary: 110 mg/dL — ABNORMAL HIGH (ref 70–99)
Glucose-Capillary: 111 mg/dL — ABNORMAL HIGH (ref 70–99)
Glucose-Capillary: 196 mg/dL — ABNORMAL HIGH (ref 70–99)
Glucose-Capillary: 287 mg/dL — ABNORMAL HIGH (ref 70–99)
Glucose-Capillary: 320 mg/dL — ABNORMAL HIGH (ref 70–99)

## 2019-02-11 LAB — COMPREHENSIVE METABOLIC PANEL
ALT: 10 U/L (ref 0–44)
AST: 16 U/L (ref 15–41)
Albumin: 2.7 g/dL — ABNORMAL LOW (ref 3.5–5.0)
Alkaline Phosphatase: 24 U/L — ABNORMAL LOW (ref 38–126)
Anion gap: 9 (ref 5–15)
BUN: 38 mg/dL — ABNORMAL HIGH (ref 8–23)
CO2: 36 mmol/L — ABNORMAL HIGH (ref 22–32)
Calcium: 8.4 mg/dL — ABNORMAL LOW (ref 8.9–10.3)
Chloride: 87 mmol/L — ABNORMAL LOW (ref 98–111)
Creatinine, Ser: 2.32 mg/dL — ABNORMAL HIGH (ref 0.61–1.24)
GFR calc Af Amer: 33 mL/min — ABNORMAL LOW (ref >60)
GFR calc non Af Amer: 28 mL/min — ABNORMAL LOW (ref >60)
Glucose, Bld: 285 mg/dL — ABNORMAL HIGH (ref 70–99)
Potassium: 4 mmol/L (ref 3.5–5.1)
Sodium: 132 mmol/L — ABNORMAL LOW (ref 135–145)
Total Bilirubin: 0.8 mg/dL (ref 0.3–1.2)
Total Protein: 6.6 g/dL (ref 6.5–8.1)

## 2019-02-11 LAB — NOVEL CORONAVIRUS, NAA (HOSP ORDER, SEND-OUT TO REF LAB; TAT 18-24 HRS): SARS-CoV-2, NAA: NOT DETECTED

## 2019-02-11 NOTE — Progress Notes (Signed)
PROGRESS NOTE    Kurt Cross  TIW:[id number] DOB: 09-01-1951 DOA: 02/03/2019 PCP: [name], MD   Brief Narrative:  68 y.o. AAM chronic systolic heart failure, CHB/A. fib/a flutter c PPM, AAA, chronic kidney disease stage III, chronic constipation, type 2 diabetes, chronic heel wound Admit 4/29 one-week history of worsening shortness of breath and worsening lower extremity edema. lower extremity edema >1 week + 8 to 10 pound weight gain + emesis 1 week prior to admission. + soft stools.    medical noncompliance patient states has missed 1 weeks worth of his Crestor and furosemide stating he ran out of his medications.   wife made him come to the ED.  ED Course: Patient seen in the ED, comprehensive metabolic profile with a glucose of 263, BUN of 31, creatinine of 2.33, albumin of 2.7 otherwise was within normal limits.  BNP of 194.2.  Troponin was negative.  CBC unremarkable.  INR of 2.7.  COVID-19 was negative.  Chest x-Pitstick done with chronic cardiomegaly, new slight pulmonary vascular prominence with probable posterior pleural effusions.  Plain films of the left foot negative for any acute abnormalities.  EKG done with a paced rhythm.  Patient given a dose of Lasix 80 mg IV x1.  Hospitalist were called to admit the patient for further evaluation and management.    Assessment & Plan:   Principal Problem:   Acute exacerbation of CHF (congestive heart failure) (HCC) Active Problems:   Diabetes mellitus, insulin dependent (IDDM), uncontrolled (HCC)   Hyperlipidemia   Obesity, Class III, BMI 40-49.9 (morbid obesity) ([place])   Obstructive sleep apnea   Essential hypertension   Chronic combined systolic and diastolic CHF (congestive heart failure) (HCC)   Thoracic aortic aneurysm (HCC)   Atrial fibrillation/flutter   Debility   S/P placement of cardiac pacemaker   Chronic anticoagulation   CKD (chronic kidney disease) stage 4, GFR 15-29 ml/min (HCC)   Diabetic ulcer of  ankle associated with type 2 diabetes mellitus, limited to breakdown of skin (HCC)   Pneumatosis of gastric wall   Nausea & vomiting  1. acute on chronic systolic heart failure exacerbation 2/2 noncompliance and running out of Lasix -7.7 L so far for admission-weight is inaccurate Lasix 60 po daily , labs am  Echo EF 35-40%-cardiology saw recommended resumption Zaroxolyn 2.5, Aldactone 25, Coreg 25 bid and is now on oral diuretics--lasix  2.  Nausea and vomiting/gastric pneumatosis/colonic ileus General surgery saw patient and signed off on 5/3 reconsult if as needed No emesis nor vomit  3.  Diabetes mellitus type 2, A1c 8.3 Continue management with NPH 10 units twice daily and every 4 sliding scale coverage-blood sugars 110--220 and will adjust in a.m. if needed He is eating 100% of meals  4.  Chronic A. fib/a flutter/status post PPM--sick sinus syndrome-on Coumadin Continue meds as above INR therapeutic 2.1  5.  Hyperlipidemia Fasting lipid panel with LDL of 49.  Continue statin.   6.  Chronic left heel wound Seems to be healing.  No drainage noted.  Wound care.  7.    Acute kidney injury on chronic kidney disease stage III Patient noted to have a slight bump in his renal function felt likely secondary to cardiorenal syndrome in the setting of diuretics and decreased oral intake.  IV diuretics have been transitioned to oral diuretics.   Labs as above  8.  Obesity  9.  Probable OSA  Patient used CPAP overnight.  Will need formal outpatient sleep study.  Needs autopap at facility  10.  Hypokalemia Continue oral potassium supplementation.    DVT prophylaxis: Coumadin Code Status: DNR Family Communication: Updated patient  And wife on phone of impending d/c Disposition Plan: Likely skilled nursing facility in the next several days   Pt will require SNF level care at the time of discharge.    COVID-19 testing is ordered in order to facilitate the discharge  process.    It is my clinical opinion that this pt does not have any symptoms of fever, cough, shortness of breath, diarrhea or altered mental status related to COVID-19 disease.   Pt does not require any specific isolation while COVID-19 test result is pending.   Consultants:   Cardiology: Dr. Harrell Gave 02/04/2019  General surgery: Dr. Johney Maine 02/05/2019  Procedures:   2D echo 02/04/2019  CT abdomen and pelvis 02/05/2019  Chest x-[place]vas 02/05/2019    Antimicrobials:   IV Zosyn 02/05/2019>>>>> 02/07/2019   Subjective:  Sleepy but no new issues Fees less swollen Poor sleep overnight No cp  No fever no chills   Objective: Vitals:   02/10/19 1100 02/10/19 1331 02/10/19 2026 02/11/19 0415  BP:  118/69 119/66 115/68  Pulse:  74 71 70  Resp:   20 17  Temp:  98.7 F (37.1 C) 100.2 F (37.9 C) 98.7 F (37.1 C)  TempSrc:  Oral Axillary Oral  SpO2: 98% 98% 96% 95%  Weight:    131.6 kg  Height:        Intake/Output Summary (Last 24 hours) at 02/11/2019 1129 Last data filed at 02/11/2019 1020 Gross per 24 hour  Intake 260 ml  Output 1875 ml  Net -1615 ml   Filed Weights   02/09/19 0407 02/10/19 0539 02/11/19 0415  Weight: (!) 136.7 kg 133.1 kg 131.6 kg    Examination:  Awake alert pleasant and in nad eomi ncat No ict no pallor Sleepy No jvd No bruit cta b without rale nor rhonchi abd obese nt dn no rebound Le swollen minimally abd not distended--non rebound no gaurd    Data Reviewed: I have personally reviewed following labs and imaging studies  CBC: Recent Labs  Lab 02/07/19 0548 02/08/19 0507 02/09/19 0601 02/10/19 0545  WBC 11.5* 13.3* 9.1 9.3  NEUTROABS  --   --   --  6.5  HGB 12.3* 12.3* 12.8* 13.1  HCT 41.2 39.6 40.7 41.1  MCV 104.6* 100.5* 99.3 100.7*  PLT 195 200 193 007   Basic Metabolic Panel: Recent Labs  Lab 02/05/19 0502 02/06/19 0513 02/06/19 1709 02/07/19 0548 02/08/19 0507 02/09/19 0601 02/10/19 0545  NA 143 145  --  136 136 136  134*  K 3.9 3.7  --  3.8 3.6 3.3* 3.7  CL 102 102  --  95* 92* 88* 87*  CO2 30 33*  --  30 35* 38* 36*  GLUCOSE 140* 125*  --  184* 208* 141* 230*  BUN 33* 37*  --  36* 34* 34* 33*  CREATININE 2.49* 2.71*  --  2.49* 2.26* 1.95* 1.95*  CALCIUM 8.4* 8.2*  --  8.0* 8.5* 8.8* 8.7*  MG 1.9  --  1.7  --   --   --  1.6*   GFR: Estimated Creatinine Clearance: 52.2 mL/min (A) (by C-G formula based on SCr of 1.95 mg/dL (H)). Liver Function Tests: No results for input(s): AST, ALT, ALKPHOS, BILITOT, PROT, ALBUMIN in the last 168 hours. No results for input(s): LIPASE, AMYLASE in the last 168 hours. Recent Labs  Lab 02/06/19 1059  AMMONIA 19   Coagulation Profile: Recent Labs  Lab 02/06/19 0513 02/07/19 0548 02/08/19 0507 02/09/19 0601 02/10/19 0545  INR 2.5* 2.4* 2.6* 2.3* 2.1*   Cardiac Enzymes: No results for input(s): CKTOTAL, CKMB, CKMBINDEX, TROPONINI in the last 168 hours. BNP (last 3 results) No results for input(s): PROBNP in the last 8760 hours. HbA1C: No results for input(s): HGBA1C in the last 72 hours. CBG: Recent Labs  Lab 02/10/19 1834 02/10/19 2024 02/10/19 2354 02/11/19 0413 02/11/19 0737  GLUCAP 322* 220* 103* 110* 111*   Lipid Profile: No results for input(s): CHOL, HDL, LDLCALC, TRIG, CHOLHDL, LDLDIRECT in the last 72 hours. Thyroid Function Tests: No results for input(s): TSH, T4TOTAL, FREET4, T3FREE, THYROIDAB in the last 72 hours. Anemia Panel: No results for input(s): VITAMINB12, FOLATE, FERRITIN, TIBC, IRON, RETICCTPCT in the last 72 hours. Sepsis Labs: Recent Labs  Lab 02/05/19 8264  LATICACIDVEN 1.1   Radiology Studies: No results found.  Scheduled Meds: . carvedilol  25 mg Oral BID WC  . furosemide  60 mg Oral Daily  . gabapentin  300 mg Oral TID  . hydrALAZINE  10 mg Oral TID  . insulin aspart  0-5 Units Subcutaneous Q4H  . insulin NPH Human  10 Units Subcutaneous BID WC  . isosorbide dinitrate  5 mg Oral TID  . mouth rinse  15 mL  Mouth Rinse BID  . metolazone  2.5 mg Oral Daily  . pantoprazole  40 mg Oral Daily  . potassium chloride SA  20 mEq Oral BID  . psyllium  1 packet Oral BID  . rosuvastatin  10 mg Oral Daily  . saccharomyces boulardii  250 mg Oral BID  . sodium chloride flush  3 mL Intravenous Q12H  . spironolactone  25 mg Oral BID  . warfarin  7.5 mg Oral q1800  . Warfarin - Pharmacist Dosing Inpatient   Does not apply q1800   Continuous Infusions: . sodium chloride       LOS: 8 days    Time spent: 25 minutes  Verneita Griffes, MD [facility] 11:29 AM

## 2019-02-11 NOTE — Progress Notes (Signed)
Herald for warfarin Indication: Afib/flutter  No Known Allergies  Patient Measurements: Height: 5\' 11"  (180.3 cm) Weight: 290 lb 2 oz (131.6 kg) IBW/kg (Calculated) : 75.3  Vital Signs: Temp: 98.7 F (37.1 C) (05/07 0415) Temp Source: Oral (05/07 0415) BP: 115/68 (05/07 0415) Pulse Rate: 70 (05/07 0415)  Labs: Recent Labs    02/09/19 0601 02/10/19 0545  HGB 12.8* 13.1  HCT 40.7 41.1  PLT 193 179  LABPROT 25.2* 23.1*  INR 2.3* 2.1*  CREATININE 1.95* 1.95*    Estimated Creatinine Clearance: 52.2 mL/min (A) (by C-G formula based on SCr of 1.95 mg/dL (H)).  Assessment: 41 yoM with PMH sCHF, Afib/flutter on warfarin, AAA, CKD3, complete HB s/p pacer, DM2, admitted for AECHF. Pharmacy to continue warfarin.   Baseline INR 2.7  Prior anticoagulation: warfarin 7.5 mg daily, LD 4/29 - some confusion about last dose since patient has history of noncompliance with other meds, is currently altered and changed his story during the admission. However, given INR 2.7, d/w [facility] MD and felt reasonably confident that dose was taken today  Significant events: 5/1 unable to tolerate POs and missed warfarin dose.  Today, 02/11/2019:  INR (2.1) therapeutic on 02/10/19 on home dose of warfarin  CBC: Hgb slightly low, Plt WNL - both stable  Major drug interactions: none currently  No bleeding issues documented  Diet as tolerated, no current plans for surgery  Goal of Therapy: INR 2-3  Plan:  Continue home warfarin dose of 7.5 mg daily  Monitor for signs of bleeding or thrombosis  Next INR Friday 5/8  If patient discharges today, recommend discharging on warfarin 7.5 mg PO daily with 1st INR check within 48 hours of discharge.  Lenis Noon, PharmD 02/11/19 9:08 AM

## 2019-02-12 DIAGNOSIS — M2550 Pain in unspecified joint: Secondary | ICD-10-CM | POA: Diagnosis not present

## 2019-02-12 DIAGNOSIS — I712 Thoracic aortic aneurysm, without rupture: Secondary | ICD-10-CM | POA: Diagnosis not present

## 2019-02-12 DIAGNOSIS — Z993 Dependence on wheelchair: Secondary | ICD-10-CM | POA: Diagnosis not present

## 2019-02-12 DIAGNOSIS — N184 Chronic kidney disease, stage 4 (severe): Secondary | ICD-10-CM | POA: Diagnosis not present

## 2019-02-12 DIAGNOSIS — M6281 Muscle weakness (generalized): Secondary | ICD-10-CM | POA: Diagnosis not present

## 2019-02-12 DIAGNOSIS — I4892 Unspecified atrial flutter: Secondary | ICD-10-CM | POA: Diagnosis not present

## 2019-02-12 DIAGNOSIS — K6389 Other specified diseases of intestine: Secondary | ICD-10-CM | POA: Diagnosis not present

## 2019-02-12 DIAGNOSIS — M10031 Idiopathic gout, right wrist: Secondary | ICD-10-CM | POA: Diagnosis not present

## 2019-02-12 DIAGNOSIS — I5043 Acute on chronic combined systolic (congestive) and diastolic (congestive) heart failure: Secondary | ICD-10-CM | POA: Diagnosis not present

## 2019-02-12 DIAGNOSIS — M25531 Pain in right wrist: Secondary | ICD-10-CM | POA: Diagnosis not present

## 2019-02-12 DIAGNOSIS — Z7901 Long term (current) use of anticoagulants: Secondary | ICD-10-CM | POA: Diagnosis not present

## 2019-02-12 DIAGNOSIS — I10 Essential (primary) hypertension: Secondary | ICD-10-CM | POA: Diagnosis not present

## 2019-02-12 DIAGNOSIS — E1165 Type 2 diabetes mellitus with hyperglycemia: Secondary | ICD-10-CM | POA: Diagnosis not present

## 2019-02-12 DIAGNOSIS — E1122 Type 2 diabetes mellitus with diabetic chronic kidney disease: Secondary | ICD-10-CM | POA: Diagnosis not present

## 2019-02-12 DIAGNOSIS — L97301 Non-pressure chronic ulcer of unspecified ankle limited to breakdown of skin: Secondary | ICD-10-CM | POA: Diagnosis not present

## 2019-02-12 DIAGNOSIS — R600 Localized edema: Secondary | ICD-10-CM | POA: Diagnosis not present

## 2019-02-12 DIAGNOSIS — G4733 Obstructive sleep apnea (adult) (pediatric): Secondary | ICD-10-CM | POA: Diagnosis not present

## 2019-02-12 DIAGNOSIS — I5042 Chronic combined systolic (congestive) and diastolic (congestive) heart failure: Secondary | ICD-10-CM | POA: Diagnosis not present

## 2019-02-12 DIAGNOSIS — E11622 Type 2 diabetes mellitus with other skin ulcer: Secondary | ICD-10-CM | POA: Diagnosis not present

## 2019-02-12 DIAGNOSIS — I499 Cardiac arrhythmia, unspecified: Secondary | ICD-10-CM | POA: Diagnosis not present

## 2019-02-12 DIAGNOSIS — M62838 Other muscle spasm: Secondary | ICD-10-CM | POA: Diagnosis not present

## 2019-02-12 DIAGNOSIS — Z7401 Bed confinement status: Secondary | ICD-10-CM | POA: Diagnosis not present

## 2019-02-12 DIAGNOSIS — R5381 Other malaise: Secondary | ICD-10-CM | POA: Diagnosis not present

## 2019-02-12 DIAGNOSIS — R531 Weakness: Secondary | ICD-10-CM | POA: Diagnosis not present

## 2019-02-12 LAB — PROTIME-INR
INR: 1.8 — ABNORMAL HIGH (ref 0.8–1.2)
Prothrombin Time: 20.5 seconds — ABNORMAL HIGH (ref 11.4–15.2)

## 2019-02-12 LAB — GLUCOSE, CAPILLARY
Glucose-Capillary: 167 mg/dL — ABNORMAL HIGH (ref 70–99)
Glucose-Capillary: 215 mg/dL — ABNORMAL HIGH (ref 70–99)
Glucose-Capillary: 249 mg/dL — ABNORMAL HIGH (ref 70–99)
Glucose-Capillary: 283 mg/dL — ABNORMAL HIGH (ref 70–99)

## 2019-02-12 LAB — RENAL FUNCTION PANEL
Albumin: 2.7 g/dL — ABNORMAL LOW (ref 3.5–5.0)
Anion gap: 12 (ref 5–15)
BUN: 37 mg/dL — ABNORMAL HIGH (ref 8–23)
CO2: 34 mmol/L — ABNORMAL HIGH (ref 22–32)
Calcium: 8.8 mg/dL — ABNORMAL LOW (ref 8.9–10.3)
Chloride: 88 mmol/L — ABNORMAL LOW (ref 98–111)
Creatinine, Ser: 2.09 mg/dL — ABNORMAL HIGH (ref 0.61–1.24)
GFR calc Af Amer: 37 mL/min — ABNORMAL LOW (ref >60)
GFR calc non Af Amer: 32 mL/min — ABNORMAL LOW (ref >60)
Glucose, Bld: 191 mg/dL — ABNORMAL HIGH (ref 70–99)
Phosphorus: 3.3 mg/dL (ref 2.5–4.6)
Potassium: 3.8 mmol/L (ref 3.5–5.1)
Sodium: 134 mmol/L — ABNORMAL LOW (ref 135–145)

## 2019-02-12 LAB — CBC
HCT: 40.7 % (ref 39.0–52.0)
Hemoglobin: 12.9 g/dL — ABNORMAL LOW (ref 13.0–17.0)
MCH: 31.6 pg (ref 26.0–34.0)
MCHC: 31.7 g/dL (ref 30.0–36.0)
MCV: 99.8 fL (ref 80.0–100.0)
Platelets: 194 10*3/uL (ref 150–400)
RBC: 4.08 MIL/uL — ABNORMAL LOW (ref 4.22–5.81)
RDW: 15.5 % (ref 11.5–15.5)
WBC: 9.8 10*3/uL (ref 4.0–10.5)
nRBC: 0 % (ref 0.0–0.2)

## 2019-02-12 MED ORDER — ISOSORBIDE DINITRATE 5 MG PO TABS: 5.0000 mg | ORAL_TABLET | Freq: Three times a day (TID) | ORAL | Status: DC

## 2019-02-12 MED ORDER — WARFARIN SODIUM 5 MG PO TABS: 10.0000 mg | ORAL_TABLET | Freq: Once | ORAL | Status: DC

## 2019-02-12 MED ORDER — WARFARIN SODIUM 5 MG PO TABS: 7.5000 mg | ORAL_TABLET | Freq: Every day | ORAL | Status: DC

## 2019-02-12 MED ORDER — FUROSEMIDE 20 MG PO TABS: 60.0000 mg | ORAL_TABLET | Freq: Every day | ORAL | 0 refills | Status: DC

## 2019-02-12 MED ORDER — INSULIN REGULAR HUMAN 100 UNIT/ML IJ SOLN: INTRAMUSCULAR | 11 refills | Status: AC

## 2019-02-12 MED ORDER — INSULIN NPH (HUMAN) (ISOPHANE) 100 UNIT/ML ~~LOC~~ SUSP: 15.0000 [IU] | Freq: Two times a day (BID) | SUBCUTANEOUS | 11 refills | Status: DC

## 2019-02-12 MED ORDER — PANTOPRAZOLE SODIUM 40 MG PO TBEC: 40.0000 mg | DELAYED_RELEASE_TABLET | Freq: Every day | ORAL | Status: DC

## 2019-02-12 NOTE — TOC Transition Note (Signed)
Transition of Care [facility] - CM/SW Discharge Note   Patient Details  Name: Kurt Cross MRN: [id number] Date of Birth: 1951/06/02  [facility] CM/SW Contact:  Dessa Phi, RN Phone Number: 02/12/2019, 12:37 PM   Clinical Narrative:  D/c to [facility] rep Juliann Pulse aware. Nurse to call report to [phone]-Nsg aware. PTAR called for 1:30p pick up-02, PTAR forms in shadow chart.DNR. No further CM needs.     Final next level of care: Skilled Nursing Facility Barriers to Discharge: Other (comment)(awaiting CIVID results initial ordered on 02/08/19 @ 15:53)   Patient Goals and CMS Choice   CMS Medicare.gov Compare Post Acute Care list provided to:: Patient Choice offered to / list presented to : Patient  Discharge Placement PASRR number recieved: 02/08/19            Patient chooses bed at: [facility] Patient to be transferred to facility by: Corey Harold) Name of family member notified: [name] spouse Patient and family notified of of transfer: 02/12/19  Discharge Plan and Services   Discharge Planning Services: CM Consult Post Acute Care Choice: [place]                        Date [place]: 02/04/19 Time HH Agency Contacted: 0930 Representative spoke with at [place]: Daine Gip  Social Determinants of Health ([place]) Interventions     Readmission Risk Interventions No flowsheet data found.

## 2019-02-12 NOTE — Discharge Summary (Signed)
Physician Discharge Summary  JAKEB LAMPING VQM:[id number] DOB: 21-Feb-1951 DOA: 02/03/2019  PCP: [name], MD  Admit date: 02/03/2019 Discharge date: 02/12/2019  Time spent: 32 minutes  Recommendations for Outpatient Follow-up:  1. Needs BMet and cbc [redacted] week along with INR 2. Needs adjustment of fluid status and based on labs as above as had alkalosis form diuresis 3. Needs CPAP at night when asleep 4. Recommend weight loss as op   Discharge Diagnoses:  Principal Problem:   Acute exacerbation of CHF (congestive heart failure) (HCC) Active Problems:   Diabetes mellitus, insulin dependent (IDDM), uncontrolled (HCC)   Hyperlipidemia   Obesity, Class III, BMI 40-49.9 (morbid obesity) ([place])   Obstructive sleep apnea   Essential hypertension   Chronic combined systolic and diastolic CHF (congestive heart failure) (HCC)   Thoracic aortic aneurysm (HCC)   Atrial fibrillation/flutter   Debility   S/P placement of cardiac pacemaker   Chronic anticoagulation   CKD (chronic kidney disease) stage 4, GFR 15-29 ml/min (HCC)   Diabetic ulcer of ankle associated with type 2 diabetes mellitus, limited to breakdown of skin ([place])   Pneumatosis of gastric wall   Nausea & vomiting   Discharge Condition: improved  Diet recommendation: hh carb mod  Filed Weights   02/10/19 0539 02/11/19 0415 02/12/19 0412  Weight: 133.1 kg 131.6 kg 131.3 kg   68 y.o. AAM chronic systolic heart failure, CHB/A. fib/a flutter c PPM, AAA, chronic kidney disease stage III, chronic constipation, type 2 diabetes, chronic heel wound Admit 4/29 one-week history of worsening shortness of breath and worsening lower extremity edema. lower extremity edema >1 week + 8 to 10 pound weight gain + emesis 1 week prior to admission. + soft stools.    medical noncompliance patient states has missed 1 weeks worth of his Crestor and furosemide stating he ran out of his medications.   wife made him come to the ED.   ED  Course: Patient seen in the ED, comprehensive metabolic profile with a glucose of 263, BUN of 31, creatinine of 2.33, albumin of 2.7 otherwise was within normal limits.  BNP of 194.2.  Troponin was negative.  CBC unremarkable.  INR of 2.7.  COVID-19 was negative.  Chest x-[place] done with chronic cardiomegaly, new slight pulmonary vascular prominence with probable posterior pleural effusions.  Plain films of the left foot negative for any acute abnormalities.  EKG done with a paced rhythm.  Patient given a dose of Lasix 80 mg IV x1.  Hospitalist were called to admit the patient for further evaluation and management.  History of present illness:  1. acute on chronic systolic heart failure exacerbation 2/2 noncompliance and running out of Lasix -8.5 L so far for admission-weight is inaccurate Lasix 60 po daily ,  Echo EF 35-40%-cardiology saw recommended resumption Zaroxolyn 2.5, Aldactone 25, Coreg 25 bid, isordil 5 tid and is now on oral diuretics--lasix 60 on d/c Enforced compliance with him and his wife---he understands not to run out of essential meds again   2.  Nausea and vomiting/gastric pneumatosis/colonic ileus General surgery saw patient and signed off on 5/3 His ileus is resolved and he is eating appropriately No emesis nor vomit   3.  Diabetes mellitus type 2, A1c 8.3 Continue management with NPH 15 units twice daily and every 4 sliding scale coverage-CBGs 200 range and above in hospital when nearing d/c    4.  Chronic A. fib/a flutter/status post PPM--sick sinus syndrome-on Coumadin Continue meds as above INR  therapeutic 1.9 on d/c Recheck inr in 4-7 days   5.  Hyperlipidemia Fasting lipid panel with LDL of 49.  Continue statin.    6.  Chronic left heel wound Seems to be healing.  No drainage noted.  Wound care.   7.    Acute kidney injury on chronic kidney disease stage III Patient noted to have a slight bump in his renal function felt likely secondary to cardiorenal syndrome  in the setting of diuretics and decreased oral intake.  IV diuretics have been transitioned to oral diuretics.   Labs as above   8.  Obesity   9.  Probable OSA  Patient used CPAP overnight.  Will need formal outpatient sleep study. Needs autopap at facility   10.  Hypokalemia Continue oral potassium supplementation    Procedures: 2D echo with a EF of 35 to 40%, moderate left ventricular hypertrophy, moderate pulmonary hypertension.   CT abdomen and pelvis which was done with moderately distended stomach with gastric pneumatosis and small volume portal venous gas.  Gas distended proximal small bowel loops without discrete transition point, prior proximal small bowel surgery.  Generalized distention of colon with ileus pattern  Consultations:  Cardiology  gen surgery  DM coordinator  Discharge Exam: Vitals:   02/11/19 2231 02/12/19 0412  BP:  129/69  Pulse:  69  Resp: 18 18  Temp:  99.1 F (37.3 C)  SpO2:  98%    General: awake alert pleasant in nad eomi ncat No pallor no ict cta b Cardiovascular: s1 s2 Vpaced without artifact on monitor Rate controlled  Respiratory: clear no added sound Mild R wrist pain--no erythema  Discharge Instructions    Allergies as of 02/12/2019   No Known Allergies     Medication List    STOP taking these medications   Abdominal Pad 8"X10" Pads   Accu-Chek Aviva device   Adhesive Paper Tape   Adhesive Tape Tape   AMD Foam Dressing 4"X4" Pads   cyclobenzaprine 5 MG tablet Commonly known as:  FLEXERIL   Disposable Gloves Misc   Gauze Bandages Misc   Kerlix Bandage Roll Misc     TAKE these medications   carvedilol 25 MG tablet Commonly known as:  COREG Take 25 mg by mouth 2 (two) times daily.   eucerin cream Apply topically as needed for wound care.   furosemide 20 MG tablet Commonly known as:  LASIX Take 3 tablets (60 mg total) by mouth daily for 30 days. Start taking on:  Feb 13, 2019 What changed:     medication strength  how much to take   gabapentin 300 MG capsule Commonly known as:  NEURONTIN Take 300 mg by mouth 3 (three) times daily.   insulin NPH Human 100 UNIT/ML injection Commonly known as:  NOVOLIN N Inject 0.15 mLs (15 Units total) into the skin 2 (two) times daily before a meal. Inject 20 units in the morning and 20 units in the evening. What changed:    how much to take  how to take this  when to take this   insulin regular 100 units/mL injection Commonly known as:  NOVOLIN R Use with sliding scale.   isosorbide dinitrate 5 MG tablet Commonly known as:  ISORDIL Take 1 tablet (5 mg total) by mouth 3 (three) times daily.   Klor-Con M20 20 MEQ tablet Generic drug:  potassium chloride SA Take 20 mg by mouth 2 (two) times daily.   metolazone 2.5 MG tablet Commonly known as:  ZAROXOLYN Take 2.5 mg by mouth daily.   pantoprazole 40 MG tablet Commonly known as:  PROTONIX Take 1 tablet (40 mg total) by mouth daily. Start taking on:  Feb 13, 2019   rosuvastatin 10 MG tablet Commonly known as:  CRESTOR Take 10 mg by mouth daily.   spironolactone 25 MG tablet Commonly known as:  ALDACTONE Take 1 tablet (25 mg total) by mouth 2 (two) times daily.   traZODone 50 MG tablet Commonly known as:  DESYREL Take 100 mg by mouth at bedtime as needed for sleep.   warfarin 7.5 MG tablet Commonly known as:  COUMADIN Take 7.5 mg by mouth daily.      No Known Allergies Follow-up [facility] Follow up.   Specialty:  Home Health Services Why:  [facility] nursing,physical therapy,occupational therapy,aide,social worker Contact information: [place] [address] [phone]            The results of significant diagnostics from this hospitalization (including imaging, microbiology, ancillary and laboratory) are listed below for reference.    Significant Diagnostic Studies: Ct Abdomen Pelvis Wo  Contrast  Result Date: 02/05/2019 CLINICAL DATA:  Persistent nausea and vomiting. Concern for bowel obstruction EXAM: CT ABDOMEN AND PELVIS WITHOUT CONTRAST TECHNIQUE: Multidetector CT imaging of the abdomen and pelvis was performed following the standard protocol without IV contrast. COMPARISON:  11/10/2017 FINDINGS: Lower chest: Streaky opacities in the lower lobes. Small pleural effusions. Cardiomegaly with biventricular pacer. Hepatobiliary: No focal liver abnormality. Apparent low-density in the posterior right liver on reformats is most likely from motion based on other source images.Cholecystectomy. Small volume portal venous gas seen in the left liver. Pancreas: Atrophy. Spleen: Unremarkable. Adrenals/Urinary Tract: 16 mm right adrenal adenoma. No hydronephrosis or stone. Unremarkable bladder. Stomach/Bowel: Generalized gaseous distention of colon which also contains stool. No volvulus or rectal impaction. Proximal small bowel loops are gas distended and there are some decompressed small bowel loops in the right upper quadrant, but no discrete transition point and gas is intermittently seen and more distal small bowel period. The stomach is moderately distended and there is fairly extensive gastric wall pneumatosis. Small volume fluid/inflammation is seen around the fundus of the stomach. Vascular/Lymphatic: No acute vascular abnormality. Atherosclerotic calcification. Reproductive:Negative Other: No pneumoperitoneum. Musculoskeletal: Advanced spondylosis with multi-level ankylosis. These results were called by telephone at the time of interpretation on 02/05/2019 at 8:42 am to Dr. Grandville Silos , who verbally acknowledged these results. Please see Z vision dashboard for details of communication. IMPRESSION: 1. Moderately distended stomach with gastric pneumatosis and small volume portal venous gas. This is presumably related patient's history of repetitive vomiting, but please correlate with exam and lactate to  exclude necrosis. There is some regional inflammation without pneumoperitoneum. 2. Gas distended proximal small bowel loops without discrete transition point. There has been prior proximal small bowel surgery. 3. Generalized distention of the colon with ileus pattern. Electronically Signed   By: Monte Fantasia M.D.   On: 02/05/2019 08:42   Dg Chest 2 View  Result Date: 02/03/2019 CLINICAL DATA:  Shortness of breath.  Hypoxia. EXAM: CHEST - 2 VIEW COMPARISON:  Chest x-[place] dated 02/13/2018 and 01/26/2016 FINDINGS: Chronic cardiomegaly pacemaker in place. Slight new pulmonary vascular prominence. On the lateral view there is suggestion of posteriorly layered pleural effusions. No acute bone abnormality. IMPRESSION: 1. Chronic cardiomegaly. 2. New slight pulmonary vascular prominence with probable posterior pleural effusions. Electronically Signed   By: Jeneen Rinks  Maxwell M.D.   On: 02/03/2019 15:36   Dg Chest Port 1 View  Result Date: 02/05/2019 CLINICAL DATA:  Vomiting and lethargy EXAM: PORTABLE CHEST 1 VIEW COMPARISON:  02/03/2019 FINDINGS: Chronic cardiomegaly and vascular pedicle widening. There is biventricular pacer leads from the left. Vascular congestion. No Kerley lines, effusion, or pneumothorax. No asymmetric airspace opacity IMPRESSION: Cardiomegaly and vascular congestion similar to recent study. Electronically Signed   By: Monte Fantasia M.D.   On: 02/05/2019 06:33   Dg Abd Portable 1v  Result Date: 02/05/2019 CLINICAL DATA:  Vomiting EXAM: PORTABLE ABDOMEN - 1 VIEW COMPARISON:  Portable exam 2312 hours compared to CT abdomen and pelvis 11/10/2017 FINDINGS: Pacer leads project over RIGHT atrium and RIGHT ventricle. Air-filled loops of bowel throughout abdomen, likely: An upper normal small bowel loops. Gas present to at least distal sigmoid colon. No definite bowel wall thickening or evidence of obstruction. Bones demineralized. IMPRESSION: Air-filled loops of large and small bowel without  definite evidence of obstruction. Electronically Signed   By: Lavonia Dana M.D.   On: 02/05/2019 00:00   Dg Foot Complete Left  Result Date: 02/03/2019 CLINICAL DATA:  Chronic heel wound. EXAM: LEFT FOOT - COMPLETE 3+ VIEW COMPARISON:  02/13/2018 FINDINGS: There is diffuse osteopenia. Slight chronic arthritic changes of the first MTP joint. Chronic arthritic changes of the ankle joint. Soft tissue swelling of the foot primarily on the dorsum of the forefoot. Slight dorsal spurring in the midfoot. No evidence of osteomyelitis. Chronic calcifications in the distal Achilles tendon. IMPRESSION: 1. No acute bone abnormalities. 2. No discrete osteomyelitis. 3. New soft tissue swelling of the foot. Electronically Signed   By: Lorriane Shire M.D.   On: 02/03/2019 15:38    Microbiology: Recent Results (from the past 240 hour(s))  SARS Coronavirus 2 [facility] order, Performed in [facility] hospital lab)     Status: None   Collection Time: 02/03/19  3:27 PM  Result Value Ref Range Status   SARS Coronavirus 2 NEGATIVE NEGATIVE Final    Comment: (NOTE) If result is NEGATIVE SARS-CoV-2 target nucleic acids are NOT DETECTED. The SARS-CoV-2 RNA is generally detectable in upper and lower  respiratory specimens during the acute phase of infection. The lowest  concentration of SARS-CoV-2 viral copies this assay can detect is 250  copies / mL. A negative result does not preclude SARS-CoV-2 infection  and should not be used as the sole basis for treatment or other  patient management decisions.  A negative result may occur with  improper specimen collection / handling, submission of specimen other  than nasopharyngeal swab, presence of viral mutation(s) within the  areas targeted by this assay, and inadequate number of viral copies  (<250 copies / mL). A negative result must be combined with clinical  observations, patient history, and epidemiological information. If result is POSITIVE SARS-CoV-2 target  nucleic acids are DETECTED. The SARS-CoV-2 RNA is generally detectable in upper and lower  respiratory specimens dur ing the acute phase of infection.  Positive  results are indicative of active infection with SARS-CoV-2.  Clinical  correlation with patient history and other diagnostic information is  necessary to determine patient infection status.  Positive results do  not rule out bacterial infection or co-infection with other viruses. If result is PRESUMPTIVE POSTIVE SARS-CoV-2 nucleic acids MAY BE PRESENT.   A presumptive positive result was obtained on the submitted specimen  and confirmed on repeat testing.  While 2019 novel coronavirus  (SARS-CoV-2) nucleic acids may be present  in the submitted sample  additional confirmatory testing may be necessary for epidemiological  and / or clinical management purposes  to differentiate between  SARS-CoV-2 and other Sarbecovirus currently known to infect humans.  If clinically indicated additional testing with an alternate test  methodology [id number]) is advised. The SARS-CoV-2 RNA is generally  detectable in upper and lower respiratory sp ecimens during the acute  phase of infection. The expected result is Negative. Fact Sheet for Patients:  StrictlyIdeas.no Fact Sheet for Healthcare Providers: BankingDealers.co.za This test is not yet approved or cleared by the [place] FDA and has been authorized for detection and/or diagnosis of SARS-CoV-2 by FDA under an Emergency Use Authorization (EUA).  This EUA will remain in effect (meaning this test can be used) for the duration of the COVID-19 declaration under Section 564(b)(1) of the Act, 21 U.S.C. section 360bbb-3(b)(1), unless the authorization is terminated or revoked sooner. Performed at [facility], [place] [address] [place], [place] [address]   Culture, Urine     Status: Abnormal   Collection Time: 02/03/19  6:59  PM  Result Value Ref Range Status   Specimen Description   Final    URINE, CLEAN CATCH Performed at [facility], [place] [address], [place], [place] [address]    Special Requests   Final    NONE Performed at [facility], [place] [address], [place], [place] [address]    Culture >=100,000 COLONIES/mL PROTEUS MIRABILIS (A)  Final   Report Status 02/07/2019 FINAL  Final   Organism ID, Bacteria PROTEUS MIRABILIS (A)  Final      Susceptibility   Proteus mirabilis - MIC*    AMPICILLIN <=2 SENSITIVE Sensitive     CEFAZOLIN <=4 SENSITIVE Sensitive     CEFTRIAXONE <=1 SENSITIVE Sensitive     CIPROFLOXACIN >=4 RESISTANT Resistant     GENTAMICIN <=1 SENSITIVE Sensitive     IMIPENEM 4 SENSITIVE Sensitive     NITROFURANTOIN 128 RESISTANT Resistant     TRIMETH/SULFA <=20 SENSITIVE Sensitive     AMPICILLIN/SULBACTAM <=2 SENSITIVE Sensitive     PIP/TAZO <=4 SENSITIVE Sensitive     * >=100,000 COLONIES/mL PROTEUS MIRABILIS  MRSA PCR Screening     Status: Abnormal   Collection Time: 02/04/19  9:38 AM  Result Value Ref Range Status   MRSA by PCR POSITIVE (A) NEGATIVE Final    Comment:        The GeneXpert MRSA Assay (FDA approved for NASAL specimens only), is one component of a comprehensive MRSA colonization surveillance program. It is not intended to diagnose MRSA infection nor to guide or monitor treatment for MRSA infections. RESULT CALLED TO, READ BACK BY AND VERIFIED WITH: Tyler Aas [id number] @ [address] Performed at [facility] [address], [place], [place] [address]   Novel Coronavirus, NAA (hospital order; send-out to ref lab)     Status: None   Collection Time: 02/08/19  3:52 PM  Result Value Ref Range Status   SARS-CoV-2, NAA NOT DETECTED NOT DETECTED Final    Comment: (NOTE) This test was developed and its performance characteristics determined by [employer]. This test has not been FDA cleared or  approved. This test has been authorized by FDA under an Emergency Use Authorization (EUA). This test is only authorized for the duration of time the declaration that circumstances exist justifying the authorization of the emergency use of in vitro diagnostic tests for detection of SARS-CoV-2 virus and/or diagnosis of  COVID-19 infection under section 564(b)(1) of the Act, 21 U.S.C. 614ERX-5(Q)(0), unless the authorization is terminated or revoked sooner. When diagnostic testing is negative, the possibility of a false negative result should be considered in the context of a patient's recent exposures and the presence of clinical signs and symptoms consistent with COVID-19. An individual without symptoms of COVID-19 and who is not shedding SARS-CoV-2 virus would expect to have a negative (not detected) result in this assay. Performed  At: [facility] [place], [place] [id number] Rush Farmer MD DT:[id number]    [place]  Final    Comment: Performed at [place] [address], [place], [place] [address]     Labs: Basic Metabolic Panel: Recent Labs  Lab 02/06/19 1709  02/08/19 0507 02/09/19 0601 02/10/19 0545 02/11/19 1411 02/12/19 0528  NA  --    < > 136 136 134* 132* 134*  K  --    < > 3.6 3.3* 3.7 4.0 3.8  CL  --    < > 92* 88* 87* 87* 88*  CO2  --    < > 35* 38* 36* 36* 34*  GLUCOSE  --    < > 208* 141* 230* 285* 191*  BUN  --    < > 34* 34* 33* 38* 37*  CREATININE  --    < > 2.26* 1.95* 1.95* 2.32* 2.09*  CALCIUM  --    < > 8.5* 8.8* 8.7* 8.4* 8.8*  MG 1.7  --   --   --  1.6*  --   --   PHOS  --   --   --   --   --   --  3.3   < > = values in this interval not displayed.   Liver Function Tests: Recent Labs  Lab 02/11/19 1411 02/12/19 0528  AST 16  --   ALT 10  --   ALKPHOS 24*  --   BILITOT 0.8  --   PROT 6.6  --   ALBUMIN 2.7* 2.7*   No results for input(s): LIPASE, AMYLASE in the last  168 hours. Recent Labs  Lab 02/06/19 1059  AMMONIA 19   CBC: Recent Labs  Lab 02/07/19 0548 02/08/19 0507 02/09/19 0601 02/10/19 0545 02/12/19 0528  WBC 11.5* 13.3* 9.1 9.3 9.8  NEUTROABS  --   --   --  6.5  --   HGB 12.3* 12.3* 12.8* 13.1 12.9*  HCT 41.2 39.6 40.7 41.1 40.7  MCV 104.6* 100.5* 99.3 100.7* 99.8  PLT 195 200 193 179 194   Cardiac Enzymes: No results for input(s): CKTOTAL, CKMB, CKMBINDEX, TROPONINI in the last 168 hours. BNP: BNP (last 3 results) Recent Labs    02/03/19 1435  BNP 194.2*    ProBNP (last 3 results) No results for input(s): PROBNP in the last 8760 hours.  CBG: Recent Labs  Lab 02/11/19 2015 02/12/19 0041 02/12/19 0406 02/12/19 0726 02/12/19 1132  GLUCAP 320* 283* 215* 167* 249*       Signed:  Nita Sells MD   [facility] 02/12/2019, 11:43 AM

## 2019-02-12 NOTE — Progress Notes (Signed)
Physical Therapy Treatment Patient Details Name: Kurt Cross MRN: [id number] DOB: [birth year] Today's Date: 02/12/2019    History of Present Illness 68 year old man was admitted to ED on 4/29 with SOB/CHF exacerbation.  PMH:  AAA, CKD, Heart block with pacemaker, DM, and chronic L heel wound    PT Comments    Co Tx with OT General transfer comment: attempted sit to stand several time as pt wanted to try using Jeralene Huff + 2 side by side assist hoever pt unable to clear hips off bed.   Pt declined to attemp sliding board to recliner   Follow Up Recommendations  Supervision/Assistance - 24 hour;SNF     Equipment Recommendations  None recommended by PT    Recommendations for Other Services       Precautions / Restrictions Precautions Precautions: Fall;ICD/Pacemaker Restrictions Weight Bearing Restrictions: No    Mobility  Bed Mobility       Sidelying to sit: HOB elevated;Min assist;Mod assist Supine to sit: Mod assist     General bed mobility comments: sitting EOB on arrival  Transfers Overall transfer level: Needs assistance   Transfers: Sit to/from Stand           General transfer comment: attempted sit to stand several time as pt wanted to try using Jeralene Huff + 2 side by side assist hoever pt unable to clear hips off bed.    Ambulation/Gait                 Stairs             Wheelchair Mobility    Modified Rankin (Stroke Patients Only)       Balance     Sitting balance-Leahy Scale: Fair                                      Cognition Arousal/Alertness: Awake/alert Behavior During Therapy: Flat affect Overall Cognitive Status: Within Functional Limits for tasks assessed                                 General Comments: pt quiet, affect not as bright as usual      Exercises      General Comments        Pertinent Vitals/Pain Pain Assessment: Faces Faces Pain Scale: Hurts a little bit Pain  Location: legs and feet Pain Descriptors / Indicators: Sore Pain Intervention(s): Limited activity within patient's tolerance;Monitored during session    Home Living                      Prior Function            PT Goals (current goals can now be found in the care plan section) Progress towards PT goals: Progressing toward goals    Frequency    Min 3X/week      PT Plan Discharge plan needs to be updated    Co-evaluation   Reason for Co-Treatment: For patient/therapist safety PT goals addressed during session: Mobility/safety with mobility OT goals addressed during session: Strengthening/ROM      AM-PAC PT "6 Clicks" Mobility   Outcome Measure  Help needed turning from your back to your side while in a flat bed without using bedrails?: Total Help needed moving from lying on your back to sitting on the side of  a flat bed without using bedrails?: Total Help needed moving to and from a bed to a chair (including a wheelchair)?: Total Help needed standing up from a chair using your arms (e.g., wheelchair or bedside chair)?: Total Help needed to walk in hospital room?: Total Help needed climbing 3-5 steps with a railing? : Total 6 Click Score: 6    End of Session Equipment Utilized During Treatment: Gait belt Activity Tolerance: Patient limited by fatigue Patient left: in bed;with call bell/phone within reach;with bed alarm set Nurse Communication: Mobility status PT Visit Diagnosis: Other abnormalities of gait and mobility (R26.89);Muscle weakness (generalized) (M62.81) Pain - Right/Left: Left     Time: 7616-0737 PT Time Calculation (min) (ACUTE ONLY): 20 min  Charges:  $Therapeutic Activity: 8-22 mins                     Rica Koyanagi  PTA Acute  Rehabilitation Services Pager      [phone] Office      [phone]

## 2019-02-12 NOTE — Progress Notes (Signed)
Patient discharged via [facility] to [facility] with personal belongings and home medications in patient possession.  Called report to [facility].

## 2019-02-12 NOTE — Progress Notes (Signed)
Kurt Cross for warfarin Indication: Afib/flutter  No Known Allergies  Patient Measurements: Height: 5\' 11"  (180.3 cm) Weight: 289 lb 8 oz (131.3 kg) IBW/kg (Calculated) : 75.3  Vital Signs: Temp: 99.1 F (37.3 C) (05/08 0412) Temp Source: Oral (05/08 0412) BP: 129/69 (05/08 0412) Pulse Rate: 69 (05/08 0412)  Labs: Recent Labs    02/10/19 0545 02/11/19 1411 02/12/19 0528  HGB 13.1  --  12.9*  HCT 41.1  --  40.7  PLT 179  --  194  LABPROT 23.1*  --  20.5*  INR 2.1*  --  1.8*  CREATININE 1.95* 2.32* 2.09*    Estimated Creatinine Clearance: 48.7 mL/min (A) (by C-G formula based on SCr of 2.09 mg/dL (H)).  Assessment: 89 yoM with PMH sCHF, Afib/flutter on warfarin, AAA, CKD3, complete HB s/p pacer, DM2, admitted for AECHF. Pharmacy to continue warfarin.   Baseline INR 2.7  Prior anticoagulation: warfarin 7.5 mg daily, LD 4/29 - some confusion about last dose since patient has history of noncompliance with other meds, is currently altered and changed his story during the admission. However, given INR 2.7, d/w TR[facility] MD and felt reasonably confident that dose was taken today  Significant events: 5/1 unable to tolerate POs and missed warfarin dose.  Today, 02/12/2019:  INR (1.8) subtherapeutic on home dose of warfarin  CBC: Hgb slightly low, Plt WNL - both stable  Major drug interactions: none currently  No bleeding issues documented  Diet as tolerated, no current plans for surgery  Goal of Therapy: INR 2-3  Plan:  Coumadin 10 mg x 1 dose today then continue home warfarin dose of 7.5 mg daily  Monitor for signs of bleeding or thrombosis  INR MWF  Eudelia Bunch, Pharm.D [phone] 02/12/2019 10:22 AM

## 2019-02-12 NOTE — Progress Notes (Signed)
Occupational Therapy Treatment Patient Details Name: Kurt Cross MRN: [id number] DOB: 11-24-1950 Today's Date: 02/12/2019    History of present illness 68 year old man was admitted to ED on 4/29 with SOB/CHF exacerbation.  PMH:  AAA, CKD, Heart block with pacemaker, DM, and chronic L heel wound   OT comments  Pt agreeable to sitting up and trying to stand with stedy lift.  He has been unable to laterally scoot and hasn't been standing, but this is his goal.  Pt states he might be leaving today.  If pt remains in hospital, we will downgrade goals on next visit  Follow Up Recommendations  SNF    Equipment Recommendations  None recommended by OT    Recommendations for Other Services      Precautions / Restrictions Precautions Precautions: Fall;ICD/Pacemaker Restrictions Weight Bearing Restrictions: No       Mobility Bed Mobility       Sidelying to sit: HOB elevated;Min assist;Mod assist Supine to sit: Mod assist     General bed mobility comments: pt requested HOB elevated for EOB; assist for bil LEs for back to bed  Transfers                 General transfer comment: worked with sara stedy to try to stand.  Pt able to clear buttocks off bed several inches (up to 6) with max A.  Unable to assist with lateral scoot up towards [place]     Sitting balance-Leahy Scale: Fair                                     ADL either performed or assessed with clinical judgement   ADL                                               Vision       Perception     Praxis      Cognition Arousal/Alertness: Awake/alert Behavior During Therapy: Flat affect Overall Cognitive Status: Within Functional Limits for tasks assessed                                 General Comments: pt quiet, affect not as bright as usual        Exercises  Pt has strong arms, but cannot manage pushing himself up to sitting.  He is not  interested in working with resistive theraband at this time, but it would be beneficial   Shoulder Instructions       General Comments      Pertinent Vitals/ Pain       Pain Assessment: Faces Faces Pain Scale: Hurts a little bit Pain Location: legs and feet Pain Descriptors / Indicators: Sore Pain Intervention(s): Limited activity within patient's tolerance;Monitored during session  Home Living                                          Prior Functioning/Environment              Frequency  Min 2X/week        Progress Toward Goals  OT Goals(current goals can now be found in the care plan section)  Progress towards OT goals: Not progressing toward goals - comment;OT to reassess next treatment     Plan      Co-evaluation    PT/OT/SLP Co-Evaluation/Treatment: Yes Reason for Co-Treatment: For patient/therapist safety PT goals addressed during session: Mobility/safety with mobility OT goals addressed during session: Strengthening/ROM      AM-PAC OT "6 Clicks" Daily Activity     Outcome Measure   Help from another person eating meals?: A Little Help from another person taking care of personal grooming?: A Little Help from another person toileting, which includes using toliet, bedpan, or urinal?: A Lot Help from another person bathing (including washing, rinsing, drying)?: A Lot Help from another person to put on and taking off regular upper body clothing?: A Lot Help from another person to put on and taking off regular lower body clothing?: Total 6 Click Score: 13    End of Session    OT Visit Diagnosis: Muscle weakness (generalized) (M62.81)   Activity Tolerance Patient tolerated treatment well   Patient Left in bed;with call bell/phone within reach;with bed alarm set   Nurse Communication          Time: 1856-3149 OT Time Calculation (min): 30 min  Charges: OT General Charges $OT Visit: 1 Visit OT Treatments $Therapeutic  Activity: 8-22 mins  [name], OTR/L Acute Rehabilitation Services [phone] [facility] pager [phone] office 02/12/2019   [place] 02/12/2019, 12:08 PM

## 2019-02-15 ENCOUNTER — Telehealth: Payer: Self-pay | Admitting: Behavioral Health

## 2019-02-15 NOTE — Telephone Encounter (Signed)
Spoke with representative at [facility] and she confirmed that the patient has been admitted to their facility post hospital discharge on 02/12/19.

## 2019-02-16 DIAGNOSIS — E1122 Type 2 diabetes mellitus with diabetic chronic kidney disease: Secondary | ICD-10-CM | POA: Diagnosis not present

## 2019-02-16 DIAGNOSIS — G4733 Obstructive sleep apnea (adult) (pediatric): Secondary | ICD-10-CM | POA: Diagnosis not present

## 2019-02-16 DIAGNOSIS — I5043 Acute on chronic combined systolic (congestive) and diastolic (congestive) heart failure: Secondary | ICD-10-CM | POA: Diagnosis not present

## 2019-02-16 DIAGNOSIS — I10 Essential (primary) hypertension: Secondary | ICD-10-CM | POA: Diagnosis not present

## 2019-02-19 DIAGNOSIS — I4892 Unspecified atrial flutter: Secondary | ICD-10-CM | POA: Diagnosis not present

## 2019-02-19 DIAGNOSIS — R600 Localized edema: Secondary | ICD-10-CM | POA: Diagnosis not present

## 2019-02-19 DIAGNOSIS — Z7901 Long term (current) use of anticoagulants: Secondary | ICD-10-CM | POA: Diagnosis not present

## 2019-02-19 DIAGNOSIS — N184 Chronic kidney disease, stage 4 (severe): Secondary | ICD-10-CM | POA: Diagnosis not present

## 2019-02-19 DIAGNOSIS — I5042 Chronic combined systolic (congestive) and diastolic (congestive) heart failure: Secondary | ICD-10-CM | POA: Diagnosis not present

## 2019-02-19 DIAGNOSIS — M25531 Pain in right wrist: Secondary | ICD-10-CM | POA: Diagnosis not present

## 2019-02-22 DIAGNOSIS — M25531 Pain in right wrist: Secondary | ICD-10-CM | POA: Diagnosis not present

## 2019-02-22 DIAGNOSIS — R600 Localized edema: Secondary | ICD-10-CM | POA: Diagnosis not present

## 2019-02-22 DIAGNOSIS — N184 Chronic kidney disease, stage 4 (severe): Secondary | ICD-10-CM | POA: Diagnosis not present

## 2019-02-22 DIAGNOSIS — I5042 Chronic combined systolic (congestive) and diastolic (congestive) heart failure: Secondary | ICD-10-CM | POA: Diagnosis not present

## 2019-02-24 DIAGNOSIS — N184 Chronic kidney disease, stage 4 (severe): Secondary | ICD-10-CM | POA: Diagnosis not present

## 2019-02-24 DIAGNOSIS — I5042 Chronic combined systolic (congestive) and diastolic (congestive) heart failure: Secondary | ICD-10-CM | POA: Diagnosis not present

## 2019-02-24 DIAGNOSIS — M25531 Pain in right wrist: Secondary | ICD-10-CM | POA: Diagnosis not present

## 2019-02-24 DIAGNOSIS — R600 Localized edema: Secondary | ICD-10-CM | POA: Diagnosis not present

## 2019-03-02 DIAGNOSIS — N184 Chronic kidney disease, stage 4 (severe): Secondary | ICD-10-CM | POA: Diagnosis not present

## 2019-03-02 DIAGNOSIS — R600 Localized edema: Secondary | ICD-10-CM | POA: Diagnosis not present

## 2019-03-02 DIAGNOSIS — M25531 Pain in right wrist: Secondary | ICD-10-CM | POA: Diagnosis not present

## 2019-03-02 DIAGNOSIS — M10031 Idiopathic gout, right wrist: Secondary | ICD-10-CM | POA: Diagnosis not present

## 2019-03-02 DIAGNOSIS — M62838 Other muscle spasm: Secondary | ICD-10-CM | POA: Diagnosis not present

## 2019-03-03 DIAGNOSIS — N184 Chronic kidney disease, stage 4 (severe): Secondary | ICD-10-CM | POA: Diagnosis not present

## 2019-03-03 DIAGNOSIS — I5042 Chronic combined systolic (congestive) and diastolic (congestive) heart failure: Secondary | ICD-10-CM | POA: Diagnosis not present

## 2019-03-05 ENCOUNTER — Telehealth: Payer: Self-pay

## 2019-03-05 DIAGNOSIS — M62838 Other muscle spasm: Secondary | ICD-10-CM | POA: Diagnosis not present

## 2019-03-05 DIAGNOSIS — I5042 Chronic combined systolic (congestive) and diastolic (congestive) heart failure: Secondary | ICD-10-CM | POA: Diagnosis not present

## 2019-03-05 DIAGNOSIS — N184 Chronic kidney disease, stage 4 (severe): Secondary | ICD-10-CM | POA: Diagnosis not present

## 2019-03-05 DIAGNOSIS — M6281 Muscle weakness (generalized): Secondary | ICD-10-CM | POA: Diagnosis not present

## 2019-03-05 DIAGNOSIS — E11622 Type 2 diabetes mellitus with other skin ulcer: Secondary | ICD-10-CM | POA: Diagnosis not present

## 2019-03-05 DIAGNOSIS — I10 Essential (primary) hypertension: Secondary | ICD-10-CM | POA: Diagnosis not present

## 2019-03-05 NOTE — Telephone Encounter (Signed)
Copied from [place] [phone]. Topic: General - Other >> Mar 05, 2019  9:55 AM Oneta Rack wrote: Relation to pt: self  Call back [phone]  Reason for call:  patient currently at [facility] will schedule follow up with PCP once discharged. Patient states [facility] requesting orders for hospital bed, please advise

## 2019-03-05 NOTE — Telephone Encounter (Signed)
Okay 

## 2019-03-05 NOTE — Telephone Encounter (Signed)
Message sent to [facility] via community message.

## 2019-03-08 ENCOUNTER — Other Ambulatory Visit: Payer: Self-pay

## 2019-03-08 ENCOUNTER — Ambulatory Visit: Payer: Medicare HMO | Admitting: Student

## 2019-03-08 ENCOUNTER — Encounter: Payer: Self-pay | Admitting: Nurse Practitioner

## 2019-03-08 ENCOUNTER — Encounter: Payer: Medicare Other | Admitting: Internal Medicine

## 2019-03-08 VITALS — BP 133/78 | HR 73 | Ht 72.0 in | Wt 280.0 lb

## 2019-03-08 DIAGNOSIS — E1165 Type 2 diabetes mellitus with hyperglycemia: Secondary | ICD-10-CM

## 2019-03-08 DIAGNOSIS — R001 Bradycardia, unspecified: Secondary | ICD-10-CM | POA: Diagnosis not present

## 2019-03-08 DIAGNOSIS — Z794 Long term (current) use of insulin: Secondary | ICD-10-CM

## 2019-03-08 DIAGNOSIS — IMO0001 Reserved for inherently not codable concepts without codable children: Secondary | ICD-10-CM

## 2019-03-08 DIAGNOSIS — I4891 Unspecified atrial fibrillation: Secondary | ICD-10-CM | POA: Diagnosis not present

## 2019-03-08 DIAGNOSIS — I5022 Chronic systolic (congestive) heart failure: Secondary | ICD-10-CM | POA: Diagnosis not present

## 2019-03-08 DIAGNOSIS — I5042 Chronic combined systolic (congestive) and diastolic (congestive) heart failure: Secondary | ICD-10-CM | POA: Diagnosis not present

## 2019-03-08 LAB — CUP PACEART INCLINIC DEVICE CHECK
Battery Remaining Longevity: 4 mo
Battery Voltage: 2.83 V
Brady Statistic AP VP Percent: 0 %
Brady Statistic AP VS Percent: 0 %
Brady Statistic AS VP Percent: 94.68 %
Brady Statistic AS VS Percent: 5.32 %
Brady Statistic RA Percent Paced: 0 %
Brady Statistic RV Percent Paced: 96.15 %
Date Time Interrogation Session: 20200601125707
Implantable Lead Implant Date: 20080911
Implantable Lead Implant Date: 20080911
Implantable Lead Implant Date: 20080911
Implantable Lead Location: 753858
Implantable Lead Location: 753859
Implantable Lead Location: 753860
Implantable Lead Model: 4194
Implantable Lead Model: 5076
Implantable Lead Model: 5076
Implantable Pulse Generator Implant Date: 20150724
Lead Channel Impedance Value: 200 Ohm
Lead Channel Impedance Value: 342 Ohm
Lead Channel Impedance Value: 437 Ohm
Lead Channel Impedance Value: 456 Ohm
Lead Channel Impedance Value: 513 Ohm
Lead Channel Impedance Value: 513 Ohm
Lead Channel Impedance Value: 570 Ohm
Lead Channel Impedance Value: 646 Ohm
Lead Channel Impedance Value: 703 Ohm
Lead Channel Pacing Threshold Amplitude: 0.5 V
Lead Channel Pacing Threshold Pulse Width: 0.4 ms
Lead Channel Sensing Intrinsic Amplitude: 3.375 mV
Lead Channel Sensing Intrinsic Amplitude: 5.375 mV
Lead Channel Sensing Intrinsic Amplitude: 6.125 mV
Lead Channel Sensing Intrinsic Amplitude: 7.25 mV
Lead Channel Setting Pacing Amplitude: 2 V
Lead Channel Setting Pacing Amplitude: 4 V
Lead Channel Setting Pacing Pulse Width: 0.4 ms
Lead Channel Setting Pacing Pulse Width: 1.5 ms
Lead Channel Setting Sensing Sensitivity: 0.9 mV

## 2019-03-08 NOTE — Progress Notes (Signed)
Device checked in clinic. See scanned report in chart.

## 2019-03-08 NOTE — Telephone Encounter (Signed)
I spoke with patient. He will need the face to face visit before we can place the order for the hospital bed. He will make an appointment for Thursday or Friday once he finds a ride.

## 2019-03-08 NOTE — Progress Notes (Addendum)
Electrophysiology Office Note Date: 03/08/2019  ID:  Kurt Cross, DOB 08/25/1951, MRN [id number]  PCP: [name], MD Primary Cardiologist: Ena Dawley, MD Electrophysiologist: Cristopher Peru, MD  CC: Pacemaker follow-up  Kurt Cross is a 68 y.o. male seen today for Dr. Lovena Le. They present today for routine electrophysiology followup. He had a recent HF admission with significant diuresis. He states he is now doing much better. He denies chest pain, palpitations, dyspnea, PND, orthopnea, nausea, vomiting, dizziness, syncope, edema, weight gain, or early satiety.  Device History: MDT-BiV-PPM initially implanted by Dr Lovena Le (06/18/2007) with generator change in [place] (04/29/2014) for CHB.   Past Medical History:  Diagnosis Date  . AAA (abdominal aortic aneurysm)/ 4.5 cm ascending per CT angio 08/01/13 08/12/2013  . Abscess 02/13/2018  . Acute on chronic renal failure (HCC) 01/27/2016   02/15/16 Na 130, K 4.6, Bun 47, creat 2.07    . Acute on chronic respiratory failure ([place]) 01/27/2016  . Acute on chronic respiratory failure with hypoxia ([place]) 01/27/2016   02/14/16 CXR no acute pulmonary abnormality, a nodule density noted at the right upper lobe, could represent a granuloma versus neoplastic lesion, may CT   . Acute renal failure (ARF) ([place]) 08/15/2013  . AKI (acute kidney injury) ([place]) 11/12/2013  . Atrial fibrillation ([place])   . Atrial flutter ([place]) 02/22/2016  . Benign hypertension   . Bradycardia   . BRONCHITIS, CHRONIC 09/01/2007   Qualifier: Diagnosis of  By: Doy Mince LPN, Megan    . Cellulitis of left lower extremity 02/14/2018  . CHF (congestive heart failure), NYHA class II ([place])   . Chronic anticoagulation 02/22/2016  . Chronic combined systolic and diastolic CHF (congestive heart failure) ([place]) 08/04/2013   02/14/16 CXR no acute pulmonary abnormality, a nodule density noted at the right upper lobe, could represent a granuloma versus neoplastic lesion, may CT    . Chronic respiratory failure ([place]) 02/10/2016  . Chronic ulcer of left heel ([place]) 02/17/2018  . CKD (chronic kidney disease), stage III ([place]) 02/17/2018  . Complete heart block (HCC)    s/p PPM Bi-Ven  . Constipation, chronic 08/03/2013  . Debility 02/10/2016  . Decubitus ulcer of sacral region, stage 3 ([place]) 02/14/2018  . Diabetes mellitus, type 2 ([place])   . H/O medication noncompliance 02/22/2016  . Hernia of abdominal cavity   . Hyperkalemia 11/12/2013  . Hyperlipidemia   . Hypertension   . Hypokalemia   . Hypoxemia   . Hypoxia 01/26/2016  . 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 Kidney failure   . Neuropathy   . Obesity, Class III, BMI 40-49.9 (morbid obesity) ([place]) 09/01/2007   Qualifier: Diagnosis of  By: Doy Mince LPN, Megan    . Obstructive sleep apnea 09/01/2007   Qualifier: Diagnosis of  By: Doy Mince LPN, Megan    . OSA (obstructive sleep apnea)   . Recurrent ventral incisional hernia 08/14/2013  . Right leg pain 08/11/2013  . S/P placement of cardiac pacemaker 02/22/2016  . Sepsis ([place]) 08/02/2013  . Short gut syndrome due to distal jejunal SB fistula 10/22/2013   Past Surgical History:  Procedure Laterality Date  . APPENDECTOMY    . biventricular pacemaker generator change  04/29/2014   MDT Viva CRT-P BiV pacemaker implanted by Dr Rolla Etienne in [place]  . biventricular pacemaker implantation  06/18/2007   MDT BIV pacemaker implanted by Dr Lovena Le  . BOWEL  RESECTION  1980s?   ?colectomy/ostomy - Dr Elesa Hacker  . INCISION AND DRAINAGE ABSCESS N/A 08/14/2013   Procedure: INCISION AND DRAINAGE ABSCESS, mesh ;  Surgeon: [name], MD;  Location: [facility] ORS;  Service: General;  Laterality: N/A;  . Agenda?   Dr. Lindon Romp - w mesh  . KNEE SURGERY Right   . LEFT HEART CATHETERIZATION WITH CORONARY ANGIOGRAM N/A 03/17/2014   Procedure: LEFT HEART  CATHETERIZATION WITH CORONARY ANGIOGRAM;  Surgeon: [name], MD;  Location: [facility] CATH LAB;  Service: Cardiovascular;  Laterality: N/A;  . NASAL SEPTUM SURGERY    . OSTOMY TAKE DOWN  1980s?   Dr Elesa Hacker  . TONSILLECTOMY AND ADENOIDECTOMY      Current Outpatient Medications  Medication Sig Dispense Refill  . carvedilol (COREG) 25 MG tablet Take 25 mg by mouth 2 (two) times daily.  3  . furosemide (LASIX) 20 MG tablet Take 3 tablets (60 mg total) by mouth daily for 30 days. 90 tablet 0  . gabapentin (NEURONTIN) 300 MG capsule Take 300 mg by mouth 3 (three) times daily.     . insulin NPH Human (NOVOLIN N) 100 UNIT/ML injection Inject 10-20 Units into the skin 2 (two) times daily before a meal.    . insulin regular (NOVOLIN R) 100 units/mL injection Use with sliding scale. 10 mL 11  . KLOR-CON M20 20 MEQ tablet Take 20 mg by mouth 2 (two) times daily.  3  . metolazone (ZAROXOLYN) 2.5 MG tablet Take 2.5 mg by mouth daily.    . pantoprazole (PROTONIX) 40 MG tablet Take 1 tablet (40 mg total) by mouth daily.    . rosuvastatin (CRESTOR) 10 MG tablet Take 10 mg by mouth daily.    . Skin Protectants, Misc. (EUCERIN) cream Apply topically as needed for wound care. 397 g 5  . spironolactone (ALDACTONE) 25 MG tablet Take 1 tablet (25 mg total) by mouth 2 (two) times daily. 30 tablet 0  . traZODone (DESYREL) 50 MG tablet Take 100 mg by mouth at bedtime as needed for sleep.   3  . warfarin (COUMADIN) 7.5 MG tablet Take 7.5 mg by mouth daily.     No current facility-administered medications for this visit.     Allergies:   Patient has no known allergies.   Social History: Social History   Socioeconomic History  . Marital status: Married    Spouse name: Not on file  . Number of children: Not on file  . Years of education: Not on file  . Highest education level: Not on file  Occupational History  . Not on file  Social Needs  . Financial resource strain: Not on file  . Food insecurity:     Worry: Not on file    Inability: Not on file  . Transportation needs:    Medical: Not on file    Non-medical: Not on file  Tobacco Use  . Smoking status: Former Smoker    Packs/day: 1.00    Years: 40.00    Pack years: 40.00    Types: Cigarettes    Last attempt to quit: 08/21/2012    Years since quitting: 6.5  . Smokeless tobacco: Never Used  Substance and Sexual Activity  . Alcohol use: Yes    Alcohol/week: 0.0 standard drinks    Comment: occasional  . Drug use: No  . Sexual activity: Not on file  Lifestyle  . Physical activity:    Days per week: Not  on file    Minutes per session: Not on file  . Stress: Not on file  Relationships  . Social connections:    Talks on phone: Not on file    Gets together: Not on file    Attends religious service: Not on file    Active member of club or organization: Not on file    Attends meetings of clubs or organizations: Not on file    Relationship status: Not on file  . Intimate partner violence:    Fear of current or ex partner: Not on file    Emotionally abused: Not on file    Physically abused: Not on file    Forced sexual activity: Not on file  Other Topics Concern  . Not on file  Social History Narrative   Lives at [facility] since 02/06/16   Married -[name]   Former smoker -stopped 2013   No Advance Directives    Family History: Family History  Problem Relation Age of Onset  . Diabetes Mother   . Alcoholism Father   . Heart Problems Maternal Grandmother      Review of Systems: All other systems reviewed and are otherwise negative except as noted above.  Physical Exam: Vitals:   03/08/19 1217  BP: 133/78  Pulse: 73  SpO2: 99%  Weight: 280 lb (127 kg)  Height: 6' (1.829 m)     GEN- The patient is well appearing, alert and oriented x 3 today.   HEENT: normocephalic, atraumatic; sclera clear, conjunctiva pink; hearing intact; oropharynx clear; neck supple  Lungs- Clear to ausculation bilaterally, normal work of  breathing.  No wheezes, rales, rhonchi Heart- Regular rate and rhythm, no murmurs, rubs or gallops  GI- soft, non-tender, non-distended, bowel sounds present  Extremities- no clubbing or cyanosis. Trace ankle edema.   MS- no significant deformity or atrophy Skin- warm and dry, no rash or lesion; PPM pocket well healed Psych- euthymic mood, full affect Neuro- strength and sensation are intact  PPM Interrogation- reviewed in detail today,  See PACEART report  EKG:  EKG is not ordered today.  Recent Labs: 02/03/2019: B Natriuretic Peptide 194.2; TSH 1.287 02/10/2019: Magnesium 1.6 02/11/2019: ALT 10 02/12/2019: BUN 37; Creatinine, Ser 2.09; Hemoglobin 12.9; Platelets 194; Potassium 3.8; Sodium 134   Wt Readings from Last 3 Encounters:  03/08/19 280 lb (127 kg)  02/12/19 289 lb 8 oz (131.3 kg)  02/13/18 283 lb (128.4 kg)     Other studies Reviewed: Additional studies/ records that were reviewed today include: Previous labwork.    Assessment and Plan:  1.  Symptomatic bradycardia Normal PPM function See Pace Art report No changes today 4 months to ERI. Will do monthly remote batter checks and check in device clinic in 3 months. He does not currently have a transmitter at home, so will arrange.   2. Permanent Afib On coumadin. No s/s of bleding  3. Chronic systolic CHF Euovelmic today after recent admission. He is on lasix 60 mg daily with DAILY metolazone and borderline kidney function. Will refer to HF clinic for help with management of his diuretics.   4. NSVT Asymptomatic. Has had history of electrolyte imbalances. BMET and Mg today.    Current medicines are reviewed at length with the patient today.   The patient does not have concerns regarding his medicines.  The following changes were made today:  none  Labs/ tests ordered today include:  Orders Placed This Encounter  Procedures  . Basic Metabolic Panel (BMET)  .  Magnesium  . AMB referral to CHF clinic      Disposition:   Follow up with Dr. Rayann Heman annually. Refer to HF clinic.   Jacalyn Lefevre, PA-C  03/08/2019 12:41 PM  [employer] [place] [address] [phone] (office) [phone] (fax)

## 2019-03-08 NOTE — Patient Instructions (Signed)
Medication Instructions:  none If you need a refill on your cardiac medications before your next appointment, please call your pharmacy.   Lab work:TODAY BMET MAGNESIUM If you have labs (blood work) drawn today and your tests are completely normal, you will receive your results only by: [name] Kitchen MyChart Message (if you have MyChart) OR . A paper copy in the mail If you have any lab test that is abnormal or we need to change your treatment, we will call you to review the results.  Testing/Procedures:   Follow-Up:Referral To CHF Clinic  Dr Rayann Heman 3 MONTHS  At [facility], you and your health needs are our priority.  As part of our continuing mission to provide you with exceptional heart care, we have created designated Provider Care Teams.  These Care Teams include your primary Cardiologist (physician) and Advanced Practice Providers (APPs -  Physician Assistants and Nurse Practitioners) who all work together to provide you with the care you need, when you need it. .   Any Other Special Instructions Will Be Listed Below (If Applicable). Remote monitoring is used to monitor your Pacemaker  from home. This monitoring reduces the number of office visits required to check your device to one time per year. It allows [place] to keep an eye on the functioning of your device to ensure it is working properly. You are scheduled for a device check from home on 04/07/2019. You may send your transmission at any time that day. If you have a wireless device, the transmission will be sent automatically. After your physician reviews your transmission, you will receive a postcard with your next transmission date.

## 2019-03-09 ENCOUNTER — Telehealth: Payer: Self-pay | Admitting: Student

## 2019-03-09 ENCOUNTER — Telehealth (HOSPITAL_COMMUNITY): Payer: Self-pay | Admitting: *Deleted

## 2019-03-09 ENCOUNTER — Other Ambulatory Visit: Payer: Self-pay

## 2019-03-09 DIAGNOSIS — I5022 Chronic systolic (congestive) heart failure: Secondary | ICD-10-CM

## 2019-03-09 DIAGNOSIS — I5042 Chronic combined systolic (congestive) and diastolic (congestive) heart failure: Secondary | ICD-10-CM

## 2019-03-09 LAB — BASIC METABOLIC PANEL
BUN/Creatinine Ratio: 21 (ref 10–24)
BUN: 51 mg/dL — ABNORMAL HIGH (ref 8–27)
CO2: 25 mmol/L (ref 20–29)
Calcium: 9.2 mg/dL (ref 8.6–10.2)
Chloride: 95 mmol/L — ABNORMAL LOW (ref 96–106)
Creatinine, Ser: 2.47 mg/dL — ABNORMAL HIGH (ref 0.76–1.27)
GFR calc Af Amer: 30 mL/min/{1.73_m2} — ABNORMAL LOW (ref >59)
GFR calc non Af Amer: 26 mL/min/{1.73_m2} — ABNORMAL LOW (ref >59)
Glucose: 105 mg/dL — ABNORMAL HIGH (ref 65–99)
Potassium: 5.2 mmol/L (ref 3.5–5.2)
Sodium: 137 mmol/L (ref 134–144)

## 2019-03-09 LAB — MAGNESIUM: Magnesium: 1.6 mg/dL (ref 1.6–2.3)

## 2019-03-09 MED ORDER — SPIRONOLACTONE 25 MG PO TABS: 25.0000 mg | ORAL_TABLET | Freq: Every day | ORAL | 3 refills | Status: DC

## 2019-03-09 MED ORDER — MAGNESIUM OXIDE -MG SUPPLEMENT 200 MG PO TABS: 200.0000 mg | ORAL_TABLET | Freq: Every day | ORAL | 3 refills | Status: DC

## 2019-03-09 NOTE — Telephone Encounter (Signed)
Spoke with patient concerning recent labs. Decreased spironolactone to 25 mg DAILY from BID, and added 200 mg mag ox daily.   Will schedule for repeat labs 7-10 days. Pt verbalizes understanding.    Legrand Como [address]" [place], PA-C 03/09/2019 8:50 AM

## 2019-03-09 NOTE — Progress Notes (Signed)
Called with patient and changed his medication. Pt to decrease spironolactone to 25 mg DAILY (from BID) and added magnesium 200 mg daily.  Can we please set him up for a repeat BMET and Mg in 7-10 days? (He needs at least 3 day notice for transportation).  Thank you.

## 2019-03-09 NOTE — Telephone Encounter (Signed)
Called and spoke with patient.  Confirmed med changes. Appointment made for 6/11 for BMET. Pt aware to come anytime between 7:30 am and 4:30 pm

## 2019-03-09 NOTE — Telephone Encounter (Signed)
-----   Message from Shirley Friar, PA-C sent at 03/09/2019  8:47 AM EDT ----- Called with patient and changed his medication. Pt to decrease spironolactone to 25 mg DAILY (from BID) and added magnesium 200 mg daily.  Can we please set him up for a repeat BMET and Mg in 7-10 days? (He needs at least 3 day notice for transportat ion).  Thank you.

## 2019-03-09 NOTE — Patient Outreach (Signed)
[facility]) Care Management  03/09/2019  Kurt Cross 1951/08/10 [id number]  Transition of care  Referral date: 03/09/19 Referral source: transition of care Insurance: [employer] Attempt #1  Telephone call to patient regarding transition of care referral. Unable to reach patient. HIPAA compliant voice message left with call back phone number.   PLAN: RNCM will attempt 2nd telephone call to patient within 4 business days.  RNCm will send outreach letter to attempt contact.   [name] RN,BSN,CCM [facility] Telephonic  [phone]

## 2019-03-11 ENCOUNTER — Other Ambulatory Visit: Payer: Self-pay

## 2019-03-11 DIAGNOSIS — I4891 Unspecified atrial fibrillation: Secondary | ICD-10-CM | POA: Diagnosis not present

## 2019-03-11 DIAGNOSIS — I4892 Unspecified atrial flutter: Secondary | ICD-10-CM | POA: Diagnosis not present

## 2019-03-11 DIAGNOSIS — E1122 Type 2 diabetes mellitus with diabetic chronic kidney disease: Secondary | ICD-10-CM | POA: Diagnosis not present

## 2019-03-11 DIAGNOSIS — I130 Hypertensive heart and chronic kidney disease with heart failure and stage 1 through stage 4 chronic kidney disease, or unspecified chronic kidney disease: Secondary | ICD-10-CM | POA: Diagnosis not present

## 2019-03-11 DIAGNOSIS — I5042 Chronic combined systolic (congestive) and diastolic (congestive) heart failure: Secondary | ICD-10-CM | POA: Diagnosis not present

## 2019-03-11 DIAGNOSIS — N184 Chronic kidney disease, stage 4 (severe): Secondary | ICD-10-CM | POA: Diagnosis not present

## 2019-03-11 DIAGNOSIS — G4733 Obstructive sleep apnea (adult) (pediatric): Secondary | ICD-10-CM | POA: Diagnosis not present

## 2019-03-11 DIAGNOSIS — I712 Thoracic aortic aneurysm, without rupture: Secondary | ICD-10-CM | POA: Diagnosis not present

## 2019-03-11 NOTE — Patient Outreach (Signed)
[facility] [place] [facility]) Care Management  03/11/2019  Kurt Cross 1951/03/23 [id number]  Transition of care  Referral date: 03/09/19 Referral source: transition of care Insurance: [employer] Attempt #2  Telephone call to patient regarding transition of care referral. HIPAA verified with patient. RNCM introduced herself and explained reason for call. Patient states he was discharged from [facility] skilled nursing facility on 03/08/19.  He states he is presently working with the physical therapist with [facility] home health agency and request call back at another time.   PLAN; RNCM will attempt 3rd telephone outreach to patient within 4 business days.   [name] RN,BSN,CCM [facility] Telephonic  [phone]

## 2019-03-12 ENCOUNTER — Ambulatory Visit (INDEPENDENT_AMBULATORY_CARE_PROVIDER_SITE_OTHER): Payer: Medicare HMO | Admitting: Family Medicine

## 2019-03-12 ENCOUNTER — Encounter: Payer: Self-pay | Admitting: Family Medicine

## 2019-03-12 ENCOUNTER — Other Ambulatory Visit: Payer: Self-pay

## 2019-03-12 VITALS — BP 128/80 | HR 89 | Ht 72.0 in

## 2019-03-12 DIAGNOSIS — E1122 Type 2 diabetes mellitus with diabetic chronic kidney disease: Secondary | ICD-10-CM | POA: Diagnosis not present

## 2019-03-12 DIAGNOSIS — N184 Chronic kidney disease, stage 4 (severe): Secondary | ICD-10-CM | POA: Diagnosis not present

## 2019-03-12 DIAGNOSIS — I509 Heart failure, unspecified: Secondary | ICD-10-CM

## 2019-03-12 DIAGNOSIS — IMO0001 Reserved for inherently not codable concepts without codable children: Secondary | ICD-10-CM

## 2019-03-12 DIAGNOSIS — Z794 Long term (current) use of insulin: Secondary | ICD-10-CM | POA: Diagnosis not present

## 2019-03-12 DIAGNOSIS — M109 Gout, unspecified: Secondary | ICD-10-CM | POA: Diagnosis not present

## 2019-03-12 DIAGNOSIS — R5381 Other malaise: Secondary | ICD-10-CM | POA: Diagnosis not present

## 2019-03-12 DIAGNOSIS — R6889 Other general symptoms and signs: Secondary | ICD-10-CM | POA: Diagnosis not present

## 2019-03-12 DIAGNOSIS — E1165 Type 2 diabetes mellitus with hyperglycemia: Secondary | ICD-10-CM | POA: Diagnosis not present

## 2019-03-12 NOTE — Patient Outreach (Signed)
[place] [facility]) Care Management  03/12/2019  Kurt Cross 1951-07-24 [id number]  Transition of care  Referral date:03/09/19 Referral source:transition of care [place]  Telephone call to patient regarding transition of care follow up. HIPAA verified with patient. RNCM introduced herself and explained reason for call.  Patient states he was discharged from [facility] skilled nursing facility. He states he has a follow up appointment today with his primary MD. He reports he is waiting for someone to pick him up now for his appointment.  Patient states he is taking his medication as prescribed. Patient state the ulcer on his heel has closed up and is healing.  Patient states he is receiving home care services with [facility].   Patient denies having any new symptoms since discharge.  RNCM discussed and offered [facility] care management services. Patient verbally agreed to receive calls from [facility].   Patient verbally agreed to next follow up call with RNCM. Patient leaving for doctors appointment.   PLAN: RNCM will follow up with patient within 1 week.  RNCM will send patient [facility] welcome packet/ consent. RNCM will send involvement letter to patient.   [name] RN,BSN,CCM [facility] Telephonic  [phone]

## 2019-03-12 NOTE — Progress Notes (Signed)
Established Patient Office Visit  Subjective:  Patient ID: Kurt Cross, male    DOB: 04-12-1951  Age: 68 y.o. MRN: [id number]  CC:  Chief Complaint  Patient presents with  . Follow-up    HPI Kurt Cross presents for follow-up of his diabetes.  He is status post recent hospitalization for acute on chronic congestive heart failure.  Much improved now.  Denies shortness of breath chest pain or swelling in his lower extremities.  Feeling much better.  For his diabetes he is taking 10 to 20 units of NPH before his morning and evening meals.  He is also taking regular insulin based on a sliding scale before each 1 of these meals.  He is no longer taking the Percocets.  He has been taking 0.6 mg of colchicine daily for his gouty arthritis.  There is been marked improvement in his pain.  Ulcer on his left heel has healed.  Follow-up is scheduled for the CHF clinic in a few weeks.    Due to diabetes and congestive heart failure. patient requires frequent changes in body position and/or has an immediate need for a change in body position. The patient requires the head of the bed to be elevated more htan 30 degrees most of hte time due to congestive heart failure. Past Medical History:  Diagnosis Date  . AAA (abdominal aortic aneurysm)/ 4.5 cm ascending per CT angio 08/01/13 08/12/2013  . Abscess 02/13/2018  . Acute on chronic renal failure (HCC) 01/27/2016   02/15/16 Na 130, K 4.6, Bun 47, creat 2.07    . Acute on chronic respiratory failure ([place]) 01/27/2016  . Acute on chronic respiratory failure with hypoxia ([place]) 01/27/2016   02/14/16 CXR no acute pulmonary abnormality, a nodule density noted at the right upper lobe, could represent a granuloma versus neoplastic lesion, may CT   . Acute renal failure (ARF) ([place]) 08/15/2013  . AKI (acute kidney injury) ([place]) 11/12/2013  . Atrial fibrillation ([place])   . Atrial flutter ([place]) 02/22/2016  . Benign hypertension   . Bradycardia   . BRONCHITIS, CHRONIC  09/01/2007   Qualifier: Diagnosis of  By: Doy Mince LPN, Megan    . Cellulitis of left lower extremity 02/14/2018  . CHF (congestive heart failure), NYHA class II ([place])   . Chronic anticoagulation 02/22/2016  . Chronic combined systolic and diastolic CHF (congestive heart failure) ([place]) 08/04/2013   02/14/16 CXR no acute pulmonary abnormality, a nodule density noted at the right upper lobe, could represent a granuloma versus neoplastic lesion, may CT   . Chronic respiratory failure ([place]) 02/10/2016  . Chronic ulcer of left heel ([place]) 02/17/2018  . CKD (chronic kidney disease), stage III ([place]) 02/17/2018  . Complete heart block (HCC)    s/p PPM Bi-Ven  . Constipation, chronic 08/03/2013  . Debility 02/10/2016  . Decubitus ulcer of sacral region, stage 3 ([place]) 02/14/2018  . Diabetes mellitus, type 2 ([place])   . H/O medication noncompliance 02/22/2016  . Hernia of abdominal cavity   . Hyperkalemia 11/12/2013  . Hyperlipidemia   . Hypertension   . Hypokalemia   . Hypoxemia   . Hypoxia 01/26/2016  . 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 Kidney failure   . Neuropathy   . Obesity, Class III, BMI 40-49.9 (morbid obesity) ([place]) 09/01/2007   Qualifier: Diagnosis of  By: Doy Mince LPN, Megan    . Obstructive sleep  apnea 09/01/2007   Qualifier: Diagnosis of  By: Doy Mince LPN, Megan    . OSA (obstructive sleep apnea)   . Recurrent ventral incisional hernia 08/14/2013  . Right leg pain 08/11/2013  . S/P placement of cardiac pacemaker 02/22/2016  . Sepsis ([place]) 08/02/2013  . Short gut syndrome due to distal jejunal SB fistula 10/22/2013    Past Surgical History:  Procedure Laterality Date  . APPENDECTOMY    . biventricular pacemaker generator change  04/29/2014   MDT Viva CRT-P BiV pacemaker implanted by Dr Rolla Etienne in [place]  . biventricular pacemaker implantation  06/18/2007   MDT BIV pacemaker  implanted by Dr Lovena Le  . BOWEL RESECTION  1980s?   ?colectomy/ostomy - Dr Elesa Hacker  . INCISION AND DRAINAGE ABSCESS N/A 08/14/2013   Procedure: INCISION AND DRAINAGE ABSCESS, mesh ;  Surgeon: [name], MD;  Location: [facility];  Service: General;  Laterality: N/A;  . [place]?   Dr. Lindon Romp - w mesh  . KNEE SURGERY Right   . LEFT HEART CATHETERIZATION WITH CORONARY ANGIOGRAM N/A 03/17/2014   Procedure: LEFT HEART CATHETERIZATION WITH CORONARY ANGIOGRAM;  Surgeon: [name], MD;  Location: [facility];  Service: Cardiovascular;  Laterality: N/A;  . NASAL SEPTUM SURGERY    . OSTOMY TAKE DOWN  1980s?   Dr Elesa Hacker  . TONSILLECTOMY AND ADENOIDECTOMY      Family History  Problem Relation Age of Onset  . Diabetes Mother   . Alcoholism Father   . Heart Problems Maternal Grandmother     Social History   Socioeconomic History  . Marital status: Married    Spouse name: Not on file  . Number of children: Not on file  . Years of education: Not on file  . Highest education level: Not on file  Occupational History  . Not on file  Social Needs  . Financial resource strain: Not on file  . Food insecurity:    Worry: Not on file    Inability: Not on file  . Transportation needs:    Medical: Not on file    Non-medical: Not on file  Tobacco Use  . Smoking status: Former Smoker    Packs/day: 1.00    Years: 40.00    Pack years: 40.00    Types: Cigarettes    Last attempt to quit: 08/21/2012    Years since quitting: 6.5  . Smokeless tobacco: Never Used  Substance and Sexual Activity  . Alcohol use: Yes    Alcohol/week: 0.0 standard drinks    Comment: occasional  . Drug use: No  . Sexual activity: Not on file  Lifestyle  . Physical activity:    Days per week: Not on file    Minutes per session: Not on file  . Stress: Not on file  Relationships  . Social connections:    Talks on phone: Not on file    Gets together: Not on file    Attends  religious service: Not on file    Active member of club or organization: Not on file    Attends meetings of clubs or organizations: Not on file    Relationship status: Not on file  . Intimate partner violence:    Fear of current or ex partner: Not on file    Emotionally abused: Not on file    Physically abused: Not on file    Forced sexual activity: Not on file  Other Topics Concern  . Not on file  Social History Narrative   Lives at [facility] since 02/06/16   Married -[name]   Former smoker -stopped 2013   No Advance Directives    Outpatient Medications Prior to Visit  Medication Sig Dispense Refill  . carvedilol (COREG) 25 MG tablet Take 25 mg by mouth 2 (two) times daily.  3  . cyclobenzaprine (FLEXERIL) 5 MG tablet Take 5 mg by mouth 2 (two) times a day.    . furosemide (LASIX) 80 MG tablet Take 80 mg by mouth daily.    [name] Kitchen gabapentin (NEURONTIN) 300 MG capsule Take 300 mg by mouth 3 (three) times daily.     . insulin NPH Human (NOVOLIN N) 100 UNIT/ML injection Inject 10-20 Units into the skin 2 (two) times daily before a meal.    . insulin regular (NOVOLIN R) 100 units/mL injection Use with sliding scale. 10 mL 11  . KLOR-CON M20 20 MEQ tablet Take 20 mg by mouth 2 (two) times daily.  3  . Magnesium Oxide 200 MG TABS Take 1 tablet (200 mg total) by mouth daily. 90 tablet 3  . metolazone (ZAROXOLYN) 2.5 MG tablet Take 2.5 mg by mouth daily.    . pantoprazole (PROTONIX) 40 MG tablet Take 1 tablet (40 mg total) by mouth daily.    . rosuvastatin (CRESTOR) 10 MG tablet Take 10 mg by mouth daily.    . Skin Protectants, Misc. (EUCERIN) cream Apply topically as needed for wound care. 397 g 5  . spironolactone (ALDACTONE) 25 MG tablet Take 1 tablet (25 mg total) by mouth daily. 90 tablet 3  . traZODone (DESYREL) 50 MG tablet Take 100 mg by mouth at bedtime as needed for sleep.   3  . warfarin (COUMADIN) 10 MG tablet Take 10 mg by mouth. Daily on Monday, Wednesday and Friday.    .  colchicine 0.6 MG tablet Take 0.6 mg by mouth daily.    . furosemide (LASIX) 20 MG tablet Take 3 tablets (60 mg total) by mouth daily for 30 days. 90 tablet 0  . warfarin (COUMADIN) 7.5 MG tablet Take 7.5 mg by mouth daily.     No facility-administered medications prior to visit.     No Known Allergies  ROS Review of Systems  Constitutional: Negative for diaphoresis, fatigue, fever and unexpected weight change.  Respiratory: Negative for chest tightness and shortness of breath.   Cardiovascular: Negative for chest pain.  Psychiatric/Behavioral: Negative.       Objective:    Physical Exam  Constitutional: He is oriented to person, place, and time. He appears well-developed and well-nourished. No distress.  HENT:  Head: Normocephalic and atraumatic.  Right Ear: External ear normal.  Left Ear: External ear normal.  Eyes: Conjunctivae are normal. Right eye exhibits no discharge. Left eye exhibits no discharge. No scleral icterus.  Neck: No JVD present. No tracheal deviation present.  Cardiovascular: An irregularly irregular rhythm present.  Pulmonary/Chest: Effort normal and breath sounds normal. No stridor. He has no rales.  Musculoskeletal:        General: No edema.  Neurological: He is alert and oriented to person, place, and time.  Skin: He is not diaphoretic.  Psychiatric: He has a normal mood and affect. His behavior is normal.    BP 128/80   Pulse 89   Ht 6' (1.829 m)   SpO2 98%   BMI 37.97 kg/m  Wt Readings from Last 3 Encounters:  03/08/19 280 lb (127 kg)  02/12/19 289 lb 8 oz (131.3 kg)  02/13/18 283 lb (128.4 kg)   BP Readings from Last 3 Encounters:  03/12/19 128/80  03/08/19 133/78  02/12/19 132/74   Guideline developer:  UpToDate (see UpToDate for funding source) Date Released: June 2014  Health Maintenance Due  Topic Date Due  . Hepatitis C Screening  1951/08/09  . FOOT EXAM  05/08/1963  . OPHTHALMOLOGY EXAM  05/08/1963  . URINE MICROALBUMIN   05/08/1963  . TETANUS/TDAP  05/07/1972  . COLONOSCOPY  05/08/2003  . PNA vac Low Risk Adult (1 of 2 - PCV13) 05/07/2018    There are no preventive care reminders to display for this patient.  Lab Results  Component Value Date   TSH 1.287 02/03/2019   Lab Results  Component Value Date   WBC 9.8 02/12/2019   HGB 12.9 (L) 02/12/2019   HCT 40.7 02/12/2019   MCV 99.8 02/12/2019   PLT 194 02/12/2019   Lab Results  Component Value Date   NA 137 03/08/2019   K 5.2 03/08/2019   CO2 25 03/08/2019   GLUCOSE 105 (H) 03/08/2019   BUN 51 (H) 03/08/2019   CREATININE 2.47 (H) 03/08/2019   BILITOT 0.8 02/11/2019   ALKPHOS 24 (L) 02/11/2019   AST 16 02/11/2019   ALT 10 02/11/2019   PROT 6.6 02/11/2019   ALBUMIN 2.7 (L) 02/12/2019   CALCIUM 9.2 03/08/2019   ANIONGAP 12 02/12/2019   GFR 35.17 (L) 12/03/2018   Lab Results  Component Value Date   CHOL 84 02/04/2019   Lab Results  Component Value Date   HDL 26 (L) 02/04/2019   Lab Results  Component Value Date   LDLCALC 49 02/04/2019   Lab Results  Component Value Date   TRIG 47 02/04/2019   Lab Results  Component Value Date   CHOLHDL 3.2 02/04/2019   Lab Results  Component Value Date   HGBA1C 8.3 (H) 02/04/2019      Assessment & Plan:   Problem List Items Addressed This Visit      Cardiovascular and Mediastinum   Acute exacerbation of CHF (congestive heart failure) (HCC) - Primary   Relevant Medications   warfarin (COUMADIN) 10 MG tablet   furosemide (LASIX) 80 MG tablet   Other Relevant Orders   For home use only DME Hospital bed     Endocrine   Diabetes mellitus, insulin dependent (IDDM), uncontrolled (HCC) (Chronic)   Relevant Orders   Basic metabolic panel   Hemoglobin A1c     Musculoskeletal and Integument   Gouty arthritis   Relevant Medications   cyclobenzaprine (FLEXERIL) 5 MG tablet     Genitourinary   CKD (chronic kidney disease) stage 4, GFR 15-29 ml/min (HCC)   Relevant Orders   Basic  metabolic panel     Other   Debility      No orders of the defined types were placed in this encounter.   Follow-up: Return in about 3 months (around 06/12/2019).   Checking hemoglobin A1c and BMP.  Note to fax a copy of the BMP to cardiology on [address].  Advised patient to take 0.3 mg of colchicine daily for his gouty arthritis.

## 2019-03-13 LAB — BASIC METABOLIC PANEL
BUN/Creatinine Ratio: 25 (calc) — ABNORMAL HIGH (ref 6–22)
BUN: 60 mg/dL — ABNORMAL HIGH (ref 7–25)
CO2: 25 mmol/L (ref 20–32)
Calcium: 9.1 mg/dL (ref 8.6–10.3)
Chloride: 102 mmol/L (ref 98–110)
Creat: 2.44 mg/dL — ABNORMAL HIGH (ref 0.70–1.25)
Glucose, Bld: 93 mg/dL (ref 65–99)
Potassium: 4.2 mmol/L (ref 3.5–5.3)
Sodium: 139 mmol/L (ref 135–146)

## 2019-03-13 LAB — HEMOGLOBIN A1C
Hgb A1c MFr Bld: 8.2 % of total Hgb — ABNORMAL HIGH (ref <5.7)
Mean Plasma Glucose: 189 (calc)
eAG (mmol/L): 10.4 (calc)

## 2019-03-15 ENCOUNTER — Telehealth: Payer: Self-pay | Admitting: Family Medicine

## 2019-03-15 ENCOUNTER — Other Ambulatory Visit: Payer: Self-pay

## 2019-03-15 DIAGNOSIS — I5043 Acute on chronic combined systolic (congestive) and diastolic (congestive) heart failure: Secondary | ICD-10-CM

## 2019-03-15 NOTE — Telephone Encounter (Signed)
Okay for verbal 

## 2019-03-15 NOTE — Telephone Encounter (Signed)
Copied from [place] [id number] [phone]. Topic: Quick Communication - Home Health Verbal Orders >> Mar 15, 2019  2:53 PM Carolyn Stare wrote: Caller/Agency Lattie Haw with [facility]      Pt INR 1.6 on 6.4.2020  Callback Number [phone]   Requesting    verbal orders to do a eval on  wound  pt said he got over the weekend

## 2019-03-15 NOTE — Telephone Encounter (Signed)
Yes, please.

## 2019-03-16 ENCOUNTER — Telehealth: Payer: Self-pay | Admitting: Cardiology

## 2019-03-16 ENCOUNTER — Other Ambulatory Visit: Payer: Self-pay

## 2019-03-16 NOTE — Telephone Encounter (Signed)
Called and left VM to return call

## 2019-03-16 NOTE — Telephone Encounter (Signed)
New Message   Lattie Haw with [facility] is calling in reference to this patients INR. She is needing instructions as well to provide INR results.  Please call to discuss.

## 2019-03-16 NOTE — Patient Outreach (Addendum)
[facility]) Care Management  03/16/2019   Kurt Cross 1951-01-01 [id number]   EMMI general discharge red alert Referral date: 03/16/19 Referral reason: Scheduled follow up: no Insurance: [employer]  Subjective: Telephone call to patient regarding EMMI general discharge red alert. HIPAA verified with patient.  RNCM explained reason for call.  Patient states he had a follow up appointment with his primary MD on last week and has a follow up with his cardiologist on 03/18/19.  Patient denies having any new symptoms or concerns since last telephone outreach with [facility].   Patient states he continues to have home care services for physical therapy.  Patient states he does not weigh daily or have a scale. Patient states he use to have a scale. He states he has not been able to ambulate or stand for some time due to foot and leg pain. RNCM discussed signs / symptoms of heart failure with patient. Patient only able to state legs/ feet swelling as heart failure symptom.  Patient states he was not taking his medication as he should but states he is now taking his medications as prescribed.  Patient states he has transportation for his upcoming cardiology appointment.   RNCM advised patient to notify MD of any changes in condition prior to scheduled appointment. RNCM verified patient aware of 911 services for urgent/ emergent needs.   Objective: see assessment  Current Medications:  Current Outpatient Medications  Medication Sig Dispense Refill  . carvedilol (COREG) 25 MG tablet Take 25 mg by mouth 2 (two) times daily.  3  . cyclobenzaprine (FLEXERIL) 5 MG tablet Take 5 mg by mouth 2 (two) times a day.    . furosemide (LASIX) 80 MG tablet Take 80 mg by mouth daily.    [name] Kitchen gabapentin (NEURONTIN) 300 MG capsule Take 300 mg by mouth 3 (three) times daily.     . insulin NPH Human (NOVOLIN N) 100 UNIT/ML injection Inject 10-20 Units into the skin 2 (two) times daily before a meal.    . insulin  regular (NOVOLIN R) 100 units/mL injection Use with sliding scale. 10 mL 11  . KLOR-CON M20 20 MEQ tablet Take 20 mg by mouth 2 (two) times daily.  3  . Magnesium Oxide 200 MG TABS Take 1 tablet (200 mg total) by mouth daily. 90 tablet 3  . metolazone (ZAROXOLYN) 2.5 MG tablet Take 2.5 mg by mouth daily.    . pantoprazole (PROTONIX) 40 MG tablet Take 1 tablet (40 mg total) by mouth daily.    . rosuvastatin (CRESTOR) 10 MG tablet Take 10 mg by mouth daily.    . Skin Protectants, Misc. (EUCERIN) cream Apply topically as needed for wound care. 397 g 5  . spironolactone (ALDACTONE) 25 MG tablet Take 1 tablet (25 mg total) by mouth daily. 90 tablet 3  . traZODone (DESYREL) 50 MG tablet Take 100 mg by mouth at bedtime as needed for sleep.   3  . warfarin (COUMADIN) 10 MG tablet Take 10 mg by mouth. Daily on Monday, Wednesday and Friday.     No current facility-administered medications for this visit.     Functional Status:  In your present state of health, do you have any difficulty performing the following activities: 03/16/2019 02/03/2019  Hearing? N N  Vision? - N  Difficulty concentrating or making decisions? - Y  Walking or climbing stairs? Y Y  Dressing or bathing? Y Y  Doing errands, shopping? [place] Donning  Preparing Food and eating ? Y -  Housekeeping or managing your Housekeeping? Y -  Some recent data might be hidden    Fall/Depression Screening: Fall Risk  03/16/2019 11/05/2016 09/13/2016  Falls in the past year? 1 Yes Yes  Comment patient states he slipped out of wheelchair approximately 3 months ago  last fall date:  05/2016  -  Number falls in past yr: 0 1 2 or more  Comment - - -  Injury with Fall? 0 No No  Risk Factor Category  - - High Fall Risk  Risk for fall due to : History of fall(s);Impaired mobility Impaired mobility History of fall(s);Impaired balance/gait;Impaired mobility;Medication side effect  Follow up Falls prevention discussed Falls prevention discussed Falls evaluation  completed;Education provided;Falls prevention discussed   PHQ 2/9 Scores 03/16/2019 11/16/2018 09/13/2016 09/04/2016  PHQ - 2 Score 0 0 0 0    Assessment: ongoing transition of care follow up   Plan: RNCM will follow up with patient within 1 week.  RNCM will send patient EMMI education material on heart failure  [name] RN,BSN,CCM [facility] Telephonic  [phone]

## 2019-03-16 NOTE — Telephone Encounter (Signed)
Spoke with Kurt Cross. We have not been managing this patient INR. However, she states that she has spoken with PCP and the provider that was seeing him in his home and they all state warfarin should be managed by cardiologist. Patient is an established patient of Dr.Taylor and primary cardiologist is listed as Dr. Meda Coffee. We will take over management of warfarin. Order placed to check INR the next time they are in the home.

## 2019-03-16 NOTE — Telephone Encounter (Signed)
Verbal given to Kurt Cross. She will contact pt's cardiologist for INR.

## 2019-03-17 ENCOUNTER — Telehealth: Payer: Self-pay | Admitting: Family Medicine

## 2019-03-17 ENCOUNTER — Telehealth: Payer: Self-pay | Admitting: *Deleted

## 2019-03-17 ENCOUNTER — Ambulatory Visit (INDEPENDENT_AMBULATORY_CARE_PROVIDER_SITE_OTHER): Payer: Medicare HMO | Admitting: Cardiology

## 2019-03-17 ENCOUNTER — Telehealth: Payer: Self-pay

## 2019-03-17 DIAGNOSIS — N184 Chronic kidney disease, stage 4 (severe): Secondary | ICD-10-CM | POA: Diagnosis not present

## 2019-03-17 DIAGNOSIS — G4733 Obstructive sleep apnea (adult) (pediatric): Secondary | ICD-10-CM | POA: Diagnosis not present

## 2019-03-17 DIAGNOSIS — I4891 Unspecified atrial fibrillation: Secondary | ICD-10-CM | POA: Diagnosis not present

## 2019-03-17 DIAGNOSIS — I4821 Permanent atrial fibrillation: Secondary | ICD-10-CM | POA: Diagnosis not present

## 2019-03-17 DIAGNOSIS — Z5181 Encounter for therapeutic drug level monitoring: Secondary | ICD-10-CM

## 2019-03-17 DIAGNOSIS — I130 Hypertensive heart and chronic kidney disease with heart failure and stage 1 through stage 4 chronic kidney disease, or unspecified chronic kidney disease: Secondary | ICD-10-CM | POA: Diagnosis not present

## 2019-03-17 DIAGNOSIS — I5042 Chronic combined systolic (congestive) and diastolic (congestive) heart failure: Secondary | ICD-10-CM | POA: Diagnosis not present

## 2019-03-17 DIAGNOSIS — I4892 Unspecified atrial flutter: Secondary | ICD-10-CM | POA: Diagnosis not present

## 2019-03-17 DIAGNOSIS — I712 Thoracic aortic aneurysm, without rupture: Secondary | ICD-10-CM | POA: Diagnosis not present

## 2019-03-17 DIAGNOSIS — E1122 Type 2 diabetes mellitus with diabetic chronic kidney disease: Secondary | ICD-10-CM | POA: Diagnosis not present

## 2019-03-17 LAB — POCT INR: INR: 2.9 (ref 2.0–3.0)

## 2019-03-17 NOTE — Patient Instructions (Addendum)
Description   Spoke with [facility] RN while in the home with the pt and instructed pt to continue taking 1 tablet daily. Recheck INR in 1 week in the office. Coumadin Clinic [phone]

## 2019-03-17 NOTE — Telephone Encounter (Signed)
Copied from [place] [id number]. Topic: Quick Communication - Home Health Verbal Orders >> Mar 17, 2019  8:37 AM Rainey Pines A wrote: Caller/Agency:Sireesha from [facility] Number: [phone] Requesting OT/PT/Skilled Nursing/Social Work/Speech Therapy: PT Frequency: 2w4

## 2019-03-17 NOTE — Telephone Encounter (Signed)

## 2019-03-17 NOTE — Telephone Encounter (Signed)
    COVID-19 Pre-Screening Questions:  . In the past 7 to 10 days have you had a cough,  shortness of breath, headache, congestion, fever (100 or greater) body aches, chills, sore throat, or sudden loss of taste or sense of smell? . Have you been around anyone with known Covid 19. . Have you been around anyone who is awaiting Covid 19 test results in the past 7 to 10 days? . Have you been around anyone who has been exposed to Covid 19, or has mentioned symptoms of Covid 19 within the past 7 to 10 days?  If you have any concerns/questions about symptoms patients report during screening (either on the phone or at threshold). Contact the provider seeing the patient or DOD for further guidance.  If neither are available contact a member of the leadership team.          Pt answered NO to all pre screening questions. klb [id number] 03/17/2019

## 2019-03-18 ENCOUNTER — Other Ambulatory Visit: Payer: Self-pay

## 2019-03-18 ENCOUNTER — Other Ambulatory Visit: Payer: Medicare HMO | Admitting: *Deleted

## 2019-03-18 ENCOUNTER — Ambulatory Visit: Payer: Self-pay

## 2019-03-18 DIAGNOSIS — I4891 Unspecified atrial fibrillation: Secondary | ICD-10-CM | POA: Diagnosis not present

## 2019-03-18 DIAGNOSIS — E1122 Type 2 diabetes mellitus with diabetic chronic kidney disease: Secondary | ICD-10-CM | POA: Diagnosis not present

## 2019-03-18 DIAGNOSIS — G4733 Obstructive sleep apnea (adult) (pediatric): Secondary | ICD-10-CM | POA: Diagnosis not present

## 2019-03-18 DIAGNOSIS — I130 Hypertensive heart and chronic kidney disease with heart failure and stage 1 through stage 4 chronic kidney disease, or unspecified chronic kidney disease: Secondary | ICD-10-CM | POA: Diagnosis not present

## 2019-03-18 DIAGNOSIS — R6889 Other general symptoms and signs: Secondary | ICD-10-CM | POA: Diagnosis not present

## 2019-03-18 DIAGNOSIS — I5030 Unspecified diastolic (congestive) heart failure: Secondary | ICD-10-CM

## 2019-03-18 DIAGNOSIS — I4892 Unspecified atrial flutter: Secondary | ICD-10-CM | POA: Diagnosis not present

## 2019-03-18 DIAGNOSIS — N184 Chronic kidney disease, stage 4 (severe): Secondary | ICD-10-CM | POA: Diagnosis not present

## 2019-03-18 DIAGNOSIS — I712 Thoracic aortic aneurysm, without rupture: Secondary | ICD-10-CM | POA: Diagnosis not present

## 2019-03-18 DIAGNOSIS — I5042 Chronic combined systolic (congestive) and diastolic (congestive) heart failure: Secondary | ICD-10-CM | POA: Diagnosis not present

## 2019-03-18 NOTE — Telephone Encounter (Signed)
Okay. This is already been done, I believe.

## 2019-03-18 NOTE — Telephone Encounter (Signed)
I left Kurt Cross a voicemail letting her know the verbal orders are approved & to call back with any questions.

## 2019-03-19 DIAGNOSIS — I5042 Chronic combined systolic (congestive) and diastolic (congestive) heart failure: Secondary | ICD-10-CM | POA: Diagnosis not present

## 2019-03-19 DIAGNOSIS — I712 Thoracic aortic aneurysm, without rupture: Secondary | ICD-10-CM | POA: Diagnosis not present

## 2019-03-19 DIAGNOSIS — N184 Chronic kidney disease, stage 4 (severe): Secondary | ICD-10-CM | POA: Diagnosis not present

## 2019-03-19 DIAGNOSIS — I4892 Unspecified atrial flutter: Secondary | ICD-10-CM | POA: Diagnosis not present

## 2019-03-19 DIAGNOSIS — E1122 Type 2 diabetes mellitus with diabetic chronic kidney disease: Secondary | ICD-10-CM | POA: Diagnosis not present

## 2019-03-19 DIAGNOSIS — G4733 Obstructive sleep apnea (adult) (pediatric): Secondary | ICD-10-CM | POA: Diagnosis not present

## 2019-03-19 DIAGNOSIS — I130 Hypertensive heart and chronic kidney disease with heart failure and stage 1 through stage 4 chronic kidney disease, or unspecified chronic kidney disease: Secondary | ICD-10-CM | POA: Diagnosis not present

## 2019-03-19 DIAGNOSIS — I4891 Unspecified atrial fibrillation: Secondary | ICD-10-CM | POA: Diagnosis not present

## 2019-03-19 LAB — BASIC METABOLIC PANEL
BUN/Creatinine Ratio: 24 (ref 10–24)
BUN: 55 mg/dL — ABNORMAL HIGH (ref 8–27)
CO2: 22 mmol/L (ref 20–29)
Calcium: 8.8 mg/dL (ref 8.6–10.2)
Chloride: 99 mmol/L (ref 96–106)
Creatinine, Ser: 2.32 mg/dL — ABNORMAL HIGH (ref 0.76–1.27)
GFR calc Af Amer: 33 mL/min/{1.73_m2} — ABNORMAL LOW (ref >59)
GFR calc non Af Amer: 28 mL/min/{1.73_m2} — ABNORMAL LOW (ref >59)
Glucose: 155 mg/dL — ABNORMAL HIGH (ref 65–99)
Potassium: 4.7 mmol/L (ref 3.5–5.2)
Sodium: 139 mmol/L (ref 134–144)

## 2019-03-23 ENCOUNTER — Ambulatory Visit: Payer: Self-pay | Admitting: Family Medicine

## 2019-03-23 ENCOUNTER — Other Ambulatory Visit: Payer: Self-pay

## 2019-03-23 ENCOUNTER — Ambulatory Visit (INDEPENDENT_AMBULATORY_CARE_PROVIDER_SITE_OTHER): Payer: Medicare HMO | Admitting: *Deleted

## 2019-03-23 ENCOUNTER — Ambulatory Visit: Payer: Self-pay

## 2019-03-23 DIAGNOSIS — R6889 Other general symptoms and signs: Secondary | ICD-10-CM | POA: Diagnosis not present

## 2019-03-23 DIAGNOSIS — I4821 Permanent atrial fibrillation: Secondary | ICD-10-CM

## 2019-03-23 DIAGNOSIS — Z5181 Encounter for therapeutic drug level monitoring: Secondary | ICD-10-CM

## 2019-03-23 LAB — POCT INR: INR: 3.5 — AB (ref 2.0–3.0)

## 2019-03-23 NOTE — Patient Instructions (Addendum)
  Description   Do not take any Coumadin tomorrow then continue taking 1 tablet daily. Recheck INR in 1 week in the office. Coumadin Clinic [phone]    A full discussion of the nature of anticoagulants has been carried out.  A benefit risk analysis has been presented to the patient, so that they understand the justification for choosing anticoagulation at this time. The need for frequent and regular monitoring, precise dosage adjustment and compliance is stressed.  Side effects of potential bleeding are discussed.  The patient should avoid any OTC items containing aspirin or ibuprofen, and should avoid great swings in general diet.  Avoid alcohol consumption.  Call if any signs of abnormal bleeding.

## 2019-03-23 NOTE — Telephone Encounter (Signed)
Pt. And wife reports pt. Has had 2 days of loose, black stools. Also noticed blood in urine this morning. Pt. Is incontinent - saw blood on pads as well. Some abdominal discomfort - "feels like gas pains." Pt. Is on Coumadin. Warm transfer to [facility] in the practice.  Answer Assessment - Initial Assessment Questions 1. APPEARANCE of BLOOD: "What color is it?" "Is it passed separately, on the surface of the stool, or mixed in with the stool?"      Black - loose. With bright red 2. AMOUNT: "How much blood was passed?"      2 days 3. FREQUENCY: "How many times has blood been passed with the stools?"      2-3 4. ONSET: "When was the blood first seen in the stools?" (Days or weeks)      2 days ago 5. DIARRHEA: "Is there also some diarrhea?" If so, ask: "How many diarrhea stools were passed in past 24 hours?"      Just loose 6. CONSTIPATION: "Do you have constipation?" If so, "How bad is it?"     Sometimes 7. RECURRENT SYMPTOMS: "Have you had blood in your stools before?" If so, ask: "When was the last time?" and "What happened that time?"      No 8. BLOOD THINNERS: "Do you take any blood thinners?" (e.g., Coumadin/warfarin, Pradaxa/dabigatran, aspirin)     Coumadin 9. OTHER SYMPTOMS: "Do you have any other symptoms?"  (e.g., abdominal pain, vomiting, dizziness, fever)     Blood in urine 10. PREGNANCY: "Is there any chance you are pregnant?" "When was your last menstrual period?"       n/a  Protocols used: RECTAL BLEEDING-A-AH

## 2019-03-23 NOTE — Telephone Encounter (Signed)
FYI - pt has appointment with you on Friday. He has an appointment today to have his INR checked with cardiology.

## 2019-03-24 ENCOUNTER — Telehealth: Payer: Self-pay

## 2019-03-24 NOTE — Telephone Encounter (Signed)
I reached out to the pt in regards to his 03/29/19 visit.  Due to current COVID 19 pandemic, our office is severely reducing in office visits until further notice, in order to minimize the risk to our patients and healthcare providers.   Pt was offered a my chart video visit and accepted. Appt time of 2 pm on 03/29/19.  Link for my chart activiation has been sent to the pt's wife ( # [phone]).  Consent will be given under the my chart visit section.  Chart has been updated for visit.

## 2019-03-25 ENCOUNTER — Telehealth: Payer: Self-pay

## 2019-03-25 DIAGNOSIS — I712 Thoracic aortic aneurysm, without rupture: Secondary | ICD-10-CM | POA: Diagnosis not present

## 2019-03-25 DIAGNOSIS — I4892 Unspecified atrial flutter: Secondary | ICD-10-CM | POA: Diagnosis not present

## 2019-03-25 DIAGNOSIS — I5042 Chronic combined systolic (congestive) and diastolic (congestive) heart failure: Secondary | ICD-10-CM | POA: Diagnosis not present

## 2019-03-25 DIAGNOSIS — I4891 Unspecified atrial fibrillation: Secondary | ICD-10-CM | POA: Diagnosis not present

## 2019-03-25 DIAGNOSIS — G4733 Obstructive sleep apnea (adult) (pediatric): Secondary | ICD-10-CM | POA: Diagnosis not present

## 2019-03-25 DIAGNOSIS — N184 Chronic kidney disease, stage 4 (severe): Secondary | ICD-10-CM | POA: Diagnosis not present

## 2019-03-25 DIAGNOSIS — I130 Hypertensive heart and chronic kidney disease with heart failure and stage 1 through stage 4 chronic kidney disease, or unspecified chronic kidney disease: Secondary | ICD-10-CM | POA: Diagnosis not present

## 2019-03-25 DIAGNOSIS — E1122 Type 2 diabetes mellitus with diabetic chronic kidney disease: Secondary | ICD-10-CM | POA: Diagnosis not present

## 2019-03-25 NOTE — Telephone Encounter (Signed)
Called pt to go over prescreening questions for tomorrow. Pt stated that he wanted to cancel the appt because he is doing better. Appt canceled and pt will let us know if it comes back/happens again.

## 2019-03-26 ENCOUNTER — Other Ambulatory Visit: Payer: Self-pay

## 2019-03-26 ENCOUNTER — Ambulatory Visit: Payer: Medicare HMO | Admitting: Family Medicine

## 2019-03-26 NOTE — Patient Outreach (Signed)
[facility]) Care Management  03/26/2019  Kurt Cross 1951/08/05 [id number]   Transition of care  Referral date:03/09/19 Referral source:transition of care Insurance:[employer]  Attempt #1  Telephone call to patient regarding referral. Unable to reach patient. HIPAA compliant voice message left with call back phone number.   PLAN: RNCM will attempt 2nd telephone call to patient within 4 business days.   [name] RN,BSN,CCM [facility] Telephonic  [phone]

## 2019-03-29 ENCOUNTER — Telehealth: Payer: Self-pay | Admitting: Neurology

## 2019-03-29 ENCOUNTER — Telehealth: Payer: Self-pay

## 2019-03-29 NOTE — Telephone Encounter (Signed)
-----   Message from Kathrynn Ducking, MD sent at 03/29/2019  2:02 PM EDT ----- This patient is unable to access the MyChart video, we will need to see him in the office as a face-to-face new visit.

## 2019-03-29 NOTE — Telephone Encounter (Signed)
I reached out to the pt to reschedule a face to face visit for an evaluation on leg weakness. Pt sates he uses [facility]at for MD appt's and only has 12 rides to MD office's for the month and will call back to reschedule this appt. Pt was provided with [employer]'s #

## 2019-03-30 ENCOUNTER — Other Ambulatory Visit: Payer: Self-pay

## 2019-03-30 DIAGNOSIS — G4733 Obstructive sleep apnea (adult) (pediatric): Secondary | ICD-10-CM | POA: Diagnosis not present

## 2019-03-30 DIAGNOSIS — N184 Chronic kidney disease, stage 4 (severe): Secondary | ICD-10-CM | POA: Diagnosis not present

## 2019-03-30 DIAGNOSIS — I4892 Unspecified atrial flutter: Secondary | ICD-10-CM | POA: Diagnosis not present

## 2019-03-30 DIAGNOSIS — E1122 Type 2 diabetes mellitus with diabetic chronic kidney disease: Secondary | ICD-10-CM | POA: Diagnosis not present

## 2019-03-30 DIAGNOSIS — I5042 Chronic combined systolic (congestive) and diastolic (congestive) heart failure: Secondary | ICD-10-CM | POA: Diagnosis not present

## 2019-03-30 DIAGNOSIS — I4891 Unspecified atrial fibrillation: Secondary | ICD-10-CM | POA: Diagnosis not present

## 2019-03-30 DIAGNOSIS — I130 Hypertensive heart and chronic kidney disease with heart failure and stage 1 through stage 4 chronic kidney disease, or unspecified chronic kidney disease: Secondary | ICD-10-CM | POA: Diagnosis not present

## 2019-03-30 DIAGNOSIS — I712 Thoracic aortic aneurysm, without rupture: Secondary | ICD-10-CM | POA: Diagnosis not present

## 2019-03-30 NOTE — Patient Outreach (Signed)
[facility]) Care Management  03/30/2019  Kurt Cross 03/17/1951 [id number]  Transition of care  Referral date:03/09/19 Referral source:transition of care Insurance:[employer]  Attempt #2  Telephone call to patient regarding referral. Patient states he is eating at time of call.  Patient agreeable to follow up call at a later time.   PLAN: RNCM will attempt 3rd  telephone call to patient within 4 business days.   [name] RN,BSN,CCM [facility] Telephonic  [phone]

## 2019-04-01 ENCOUNTER — Telehealth: Payer: Self-pay | Admitting: Family Medicine

## 2019-04-01 ENCOUNTER — Other Ambulatory Visit: Payer: Self-pay

## 2019-04-01 DIAGNOSIS — L97301 Non-pressure chronic ulcer of unspecified ankle limited to breakdown of skin: Secondary | ICD-10-CM

## 2019-04-01 DIAGNOSIS — E11622 Type 2 diabetes mellitus with other skin ulcer: Secondary | ICD-10-CM

## 2019-04-01 DIAGNOSIS — N184 Chronic kidney disease, stage 4 (severe): Secondary | ICD-10-CM

## 2019-04-01 DIAGNOSIS — I5042 Chronic combined systolic (congestive) and diastolic (congestive) heart failure: Secondary | ICD-10-CM

## 2019-04-01 NOTE — Patient Outreach (Signed)
[facility]) Care Management  04/01/2019  TEDD COTTRILL [birth year] [id number]  Transition of care  Referral date:03/09/19 Referral source:transition of care [place]  Telephone call to patient regarding transition of care follow up. HIPAA verified with patient.  Patient denies shortness of breath and swelling in lower extremities. Patient states he is not able to weigh because he cannot walk right now.   Patient states he has seen his primary MD and cardiologist.  Patient states he needs to have his toenails cut.  He states, "my toenails are very bad."  Patient denies having a podiatrist.  Patient states he has problems with transportation. He states he is using transportation with his health insurance but states he has limited visits. Patient states, "Its hard to get to all of the doctor appointments and appointments for lab work without having transportation."  Patient states he currently is doing everything by wheelchair because he is not walking. Patient states the [facility] physical therapist is working with him and hopes to start getting him up to walk within the next couple of weeks.  Patient reports his blood sugars are doing well. He reports today's blood sugar was 148.  Patient states his blood sugars have recently ranged in the low 100's.  Patient states, " My blood sugar is doing very well.   Patient states he has lost 20 lbs over the last month and a half. He states he is very pleased with this.  Patient states I need a bed side commode with the arm that lifts so I can transition to it.  Patient states he is not able to ambulate to the bathroom.  RNCM discussed heart failure symptoms and action plan / zones.  RNCM advised patient to call his doctor when he experiences symptoms in the yellow zone.  Patient verbalized understanding.  Patient states, " this will hopefully help to keep me out of the hospital if I can see my doctor act that time."   RNCM  discussed referring patient to [facility] social worker for transportation assistance. Patient verbally agreed.  RNCM advised patient to notify MD of any changes in condition prior to scheduled appointment.  RNCM called patients primary MD office and spoke with Dominic. Dominic states office is closed at this time but he will take message and provided to office for return call on 04/02/19.  RNCM requested order for patient for a bed side commode with removable or arm that lifts.  Dominic states he will forward this message to the doctor.    PLAN; RNCM will follow up with patient within 1 week.  RNCM will refer patient to Education officer, museum.   [name] RN,BSN,CCM [facility] Telephonic  [phone]

## 2019-04-01 NOTE — Telephone Encounter (Signed)
Caller/Agency: [name], [place] network Callback Number: [phone] Requesting OT/PT/Skilled Nursing/Social Work/Speech Therapy: Calling to see if pt can be ordered a bedside commode. 3 in 1 with a removable arm or an arm that lifts on one side.

## 2019-04-02 ENCOUNTER — Other Ambulatory Visit: Payer: Self-pay

## 2019-04-02 DIAGNOSIS — I130 Hypertensive heart and chronic kidney disease with heart failure and stage 1 through stage 4 chronic kidney disease, or unspecified chronic kidney disease: Secondary | ICD-10-CM | POA: Diagnosis not present

## 2019-04-02 DIAGNOSIS — I4892 Unspecified atrial flutter: Secondary | ICD-10-CM | POA: Diagnosis not present

## 2019-04-02 DIAGNOSIS — I4891 Unspecified atrial fibrillation: Secondary | ICD-10-CM | POA: Diagnosis not present

## 2019-04-02 DIAGNOSIS — E1122 Type 2 diabetes mellitus with diabetic chronic kidney disease: Secondary | ICD-10-CM | POA: Diagnosis not present

## 2019-04-02 DIAGNOSIS — N184 Chronic kidney disease, stage 4 (severe): Secondary | ICD-10-CM | POA: Diagnosis not present

## 2019-04-02 DIAGNOSIS — I5042 Chronic combined systolic (congestive) and diastolic (congestive) heart failure: Secondary | ICD-10-CM | POA: Diagnosis not present

## 2019-04-02 DIAGNOSIS — I712 Thoracic aortic aneurysm, without rupture: Secondary | ICD-10-CM | POA: Diagnosis not present

## 2019-04-02 DIAGNOSIS — G4733 Obstructive sleep apnea (adult) (pediatric): Secondary | ICD-10-CM | POA: Diagnosis not present

## 2019-04-02 NOTE — Patient Outreach (Signed)
[facility]) Care Management  04/02/2019  TORRENCE HAMMACK 1951-08-04 [id number]   Successful outreach to patient regarding social work referral for transportation resources.  Patient has [employer] transportation but has almost exhausted all rides.  BSW and patient discussed SCAT services.  Application completed via phone and sent to SCAT eligibility.  BSW will follow up within the next 2-3 weeks regarding determination.  Ronn Melena, BSW Social Worker [phone]

## 2019-04-05 NOTE — Telephone Encounter (Signed)
Okay for verbal 

## 2019-04-05 NOTE — Telephone Encounter (Signed)
Okay 

## 2019-04-05 NOTE — Telephone Encounter (Signed)
I spoke with [name]. Order placed in epic for bedside commode.

## 2019-04-06 DIAGNOSIS — G4733 Obstructive sleep apnea (adult) (pediatric): Secondary | ICD-10-CM | POA: Diagnosis not present

## 2019-04-06 DIAGNOSIS — I4891 Unspecified atrial fibrillation: Secondary | ICD-10-CM | POA: Diagnosis not present

## 2019-04-06 DIAGNOSIS — I5042 Chronic combined systolic (congestive) and diastolic (congestive) heart failure: Secondary | ICD-10-CM | POA: Diagnosis not present

## 2019-04-06 DIAGNOSIS — I130 Hypertensive heart and chronic kidney disease with heart failure and stage 1 through stage 4 chronic kidney disease, or unspecified chronic kidney disease: Secondary | ICD-10-CM | POA: Diagnosis not present

## 2019-04-06 DIAGNOSIS — E1122 Type 2 diabetes mellitus with diabetic chronic kidney disease: Secondary | ICD-10-CM | POA: Diagnosis not present

## 2019-04-06 DIAGNOSIS — N184 Chronic kidney disease, stage 4 (severe): Secondary | ICD-10-CM | POA: Diagnosis not present

## 2019-04-06 DIAGNOSIS — I712 Thoracic aortic aneurysm, without rupture: Secondary | ICD-10-CM | POA: Diagnosis not present

## 2019-04-06 DIAGNOSIS — I4892 Unspecified atrial flutter: Secondary | ICD-10-CM | POA: Diagnosis not present

## 2019-04-08 ENCOUNTER — Other Ambulatory Visit: Payer: Self-pay

## 2019-04-08 DIAGNOSIS — I130 Hypertensive heart and chronic kidney disease with heart failure and stage 1 through stage 4 chronic kidney disease, or unspecified chronic kidney disease: Secondary | ICD-10-CM | POA: Diagnosis not present

## 2019-04-08 DIAGNOSIS — I4891 Unspecified atrial fibrillation: Secondary | ICD-10-CM | POA: Diagnosis not present

## 2019-04-08 DIAGNOSIS — G4733 Obstructive sleep apnea (adult) (pediatric): Secondary | ICD-10-CM | POA: Diagnosis not present

## 2019-04-08 DIAGNOSIS — I5042 Chronic combined systolic (congestive) and diastolic (congestive) heart failure: Secondary | ICD-10-CM | POA: Diagnosis not present

## 2019-04-08 DIAGNOSIS — E1122 Type 2 diabetes mellitus with diabetic chronic kidney disease: Secondary | ICD-10-CM | POA: Diagnosis not present

## 2019-04-08 DIAGNOSIS — I712 Thoracic aortic aneurysm, without rupture: Secondary | ICD-10-CM | POA: Diagnosis not present

## 2019-04-08 DIAGNOSIS — N184 Chronic kidney disease, stage 4 (severe): Secondary | ICD-10-CM | POA: Diagnosis not present

## 2019-04-08 DIAGNOSIS — I4892 Unspecified atrial flutter: Secondary | ICD-10-CM | POA: Diagnosis not present

## 2019-04-08 NOTE — Patient Outreach (Signed)
[place] [facility]) Care Management  04/08/2019  Kurt Cross 1951/08/11 [id number]  Transition of care  Referral date:03/09/19 Referral source:transition of care [place]  Telephone call to patient regarding transition of care. HIPAA verified with patient. Patient states he has not heard from the doctors office regarding the bed side commode.  Patient states he spoke with [facility] care management social worker regarding transportation. Patient states she is working with him to get a SCAT application completed. Patient states he still needs to get blood work done and have his toenail clipped. Patient states he is not able to get to the doctors office to have his weekly lab done for coumadin levels.   Patient reports his fasting blood sugar this am is 150.  Patient  States, " I know its a little high but that's because I ate late last night and did not take my las shot to cover."  Patient states, " I won't be doing that again."  RNCM informed patient she will follow up with his primary MD regarding the bedside commode, lab work needed, and toenail cutting.  Patient verbalized understanding and agreement.   PLAN:  RNCm will follow up with patient within 1 week.  RNCm will call patients primay MD office regarding care coordination needs.   [name] RN,BSN,CCM [facility] Telephonic  [phone]

## 2019-04-13 ENCOUNTER — Ambulatory Visit: Payer: Medicare HMO

## 2019-04-14 ENCOUNTER — Telehealth: Payer: Self-pay | Admitting: Family Medicine

## 2019-04-14 NOTE — Telephone Encounter (Signed)
Okay to refill? 

## 2019-04-14 NOTE — Telephone Encounter (Signed)
Medications:  colchicine 0.6 MG tablet  metolazone (ZAROXOLYN) 2.5 MG tablet  pantoprazole (PROTONIX) 40 MG tablet    Patient is requesting refill.    Pharmacy:  [facility] [address] ([address]), [place] [phone] (Phone) [phone] (Fax)

## 2019-04-15 ENCOUNTER — Telehealth: Payer: Self-pay | Admitting: Family Medicine

## 2019-04-15 ENCOUNTER — Ambulatory Visit: Payer: Self-pay

## 2019-04-15 MED ORDER — METOLAZONE 2.5 MG PO TABS: 2.5000 mg | ORAL_TABLET | Freq: Every day | ORAL | 2 refills | Status: DC

## 2019-04-15 MED ORDER — COLCHICINE 0.6 MG PO TABS: 0.6000 mg | ORAL_TABLET | Freq: Every day | ORAL | 2 refills | Status: DC

## 2019-04-15 NOTE — Telephone Encounter (Signed)
Rxs sent

## 2019-04-15 NOTE — Telephone Encounter (Signed)
Please advise if okay for home health to draw his lab work? And what to do about his toenails?

## 2019-04-15 NOTE — Telephone Encounter (Signed)
[name] with [facility] calling and states that she has been checking with the patient and states that he had mention that he cannot get up and walk for long periods of time. States that he said he was needing some lab work done and was not sure if this is something that home health could do when they go out? Also states that his toenails are getting so long that they are curling up. She was not sure if this is something that [place] could help with or if there was a podiatry company that could go out and clip his toenails. Please advise.  CB#: [phone]

## 2019-04-15 NOTE — Telephone Encounter (Signed)
Okay 

## 2019-04-16 ENCOUNTER — Other Ambulatory Visit: Payer: Self-pay

## 2019-04-16 DIAGNOSIS — I4891 Unspecified atrial fibrillation: Secondary | ICD-10-CM | POA: Diagnosis not present

## 2019-04-16 DIAGNOSIS — I4892 Unspecified atrial flutter: Secondary | ICD-10-CM | POA: Diagnosis not present

## 2019-04-16 DIAGNOSIS — N184 Chronic kidney disease, stage 4 (severe): Secondary | ICD-10-CM | POA: Diagnosis not present

## 2019-04-16 DIAGNOSIS — I712 Thoracic aortic aneurysm, without rupture: Secondary | ICD-10-CM | POA: Diagnosis not present

## 2019-04-16 DIAGNOSIS — I130 Hypertensive heart and chronic kidney disease with heart failure and stage 1 through stage 4 chronic kidney disease, or unspecified chronic kidney disease: Secondary | ICD-10-CM | POA: Diagnosis not present

## 2019-04-16 DIAGNOSIS — E1122 Type 2 diabetes mellitus with diabetic chronic kidney disease: Secondary | ICD-10-CM | POA: Diagnosis not present

## 2019-04-16 DIAGNOSIS — G4733 Obstructive sleep apnea (adult) (pediatric): Secondary | ICD-10-CM | POA: Diagnosis not present

## 2019-04-16 DIAGNOSIS — I5042 Chronic combined systolic (congestive) and diastolic (congestive) heart failure: Secondary | ICD-10-CM | POA: Diagnosis not present

## 2019-04-16 NOTE — Telephone Encounter (Signed)
Yes that would be okay

## 2019-04-16 NOTE — Patient Outreach (Signed)
[facility] [facility] Care Management  04/16/2019  Kurt Cross 1951/01/06 [id number]   Transition of care  Referral date:03/09/19 Referral source:transition of care [place]  Voice mail message received from [place] with Dr. Bebe Shaggy office stating Dr. Ethelene Hal has written order to be submitted for home health to do lab work and cut patients toenails.   RNCM contacted patient and notified him that a referral has been submitted by his doctors office to the home health agency for requested services.  Patient verbalized appreciation. Patient states has not heard anything regarding a bedside commode.  RNCM will follow up on request.   PLAN; RNCM will follow up with patient within 1 week.   [name] RN,BSN,CCM [facility] Telephonic  [phone]

## 2019-04-16 NOTE — Telephone Encounter (Signed)
I left a voicemail for [place]. I let her know that it was okay with Dr. Ethelene Hal for home health to draw his labs & cut his toenails. I have sent a message to [facility] to see if this can be done.

## 2019-04-20 ENCOUNTER — Other Ambulatory Visit: Payer: Self-pay

## 2019-04-20 ENCOUNTER — Telehealth: Payer: Self-pay | Admitting: Family Medicine

## 2019-04-20 ENCOUNTER — Ambulatory Visit: Payer: Self-pay

## 2019-04-20 DIAGNOSIS — L97309 Non-pressure chronic ulcer of unspecified ankle with unspecified severity: Secondary | ICD-10-CM

## 2019-04-20 DIAGNOSIS — IMO0001 Reserved for inherently not codable concepts without codable children: Secondary | ICD-10-CM

## 2019-04-20 DIAGNOSIS — E11622 Type 2 diabetes mellitus with other skin ulcer: Secondary | ICD-10-CM

## 2019-04-20 DIAGNOSIS — R5381 Other malaise: Secondary | ICD-10-CM

## 2019-04-20 MED ORDER — POTASSIUM CHLORIDE CRYS ER 20 MEQ PO TBCR: 20.0000 meq | EXTENDED_RELEASE_TABLET | Freq: Two times a day (BID) | ORAL | 1 refills | Status: DC

## 2019-04-20 NOTE — Telephone Encounter (Signed)
Spoke with pt. He was needing a refill on his gout medication and potassium. Spoke with Dr. Ethelene Hal - pt needs to d/c the potassium medication at this time. Pt is aware and I called the pharmacy to cancel the Rx. Gout medication was sent in on 7/9 and should be ready for pt to pick up.

## 2019-04-20 NOTE — Telephone Encounter (Signed)
General/Other - Patient would like someone to call him back regarding his medication list. He need clarification on some things. Thank you

## 2019-04-20 NOTE — Telephone Encounter (Signed)
[name] from [facility] called, about this order.  She is wanting to know where the order was sent to.  The pt has not heard anything.  Please call [name] with status update [phone]

## 2019-04-20 NOTE — Patient Outreach (Signed)
[facility]) Care Management  04/20/2019  MIKIE MISNER 01-29-1951 [id number]  Care coordination Referral date:03/09/19 Referral source:transition of care Insurance:[employer]  Spoke with Melissa at Dr. Bebe Shaggy office regarding order for Bed side commode with elevated  And/ or removable arms.  Melissa states she will  give message to Judson Roch for follow up.   PLAN; RNCM will await return call from Dr. Bebe Shaggy office.    [name] RN,BSN,CCM [facility] Telephonic  [phone]

## 2019-04-20 NOTE — Telephone Encounter (Signed)
Message sent to [facility] to see if they can provide one for pt. Awaiting response.

## 2019-04-21 ENCOUNTER — Other Ambulatory Visit: Payer: Self-pay

## 2019-04-21 ENCOUNTER — Ambulatory Visit: Payer: Self-pay

## 2019-04-21 NOTE — Telephone Encounter (Signed)
Message sent to [facility] to see if they can provide bedside commode for pt. Awaiting response.

## 2019-04-21 NOTE — Patient Outreach (Signed)
[facility]) Care Management  04/21/2019  Kurt Cross 1950/10/09 [id number]   Outreach to SCAT eligibility regarding status of application.  Application is still being processed.  Will follow up again next week.  Will follow up with patient when determination is made.  Ronn Melena, BSW Social Worker [phone]

## 2019-04-22 ENCOUNTER — Other Ambulatory Visit: Payer: Self-pay

## 2019-04-22 NOTE — Telephone Encounter (Signed)
Kurt Cross with [facility] called because she has not had a response regarding patient's bedside commode.  She stated this has been requested since June.  Please advise and call back with a response.  CB# [phone]

## 2019-04-22 NOTE — Patient Outreach (Signed)
[facility]) Care Management  04/22/2019  LADERRICK WILK May 23, 1951 [id number]  Telephone assessment Referral date:03/09/19 Referral source:transition of care [place]  Telephone call to patients primary MD office to follow up on request for bedside commode order. Spoke with Hassan Rowan and explained that initial request was made for a bedside commode for patient on 04/01/19.  Explained to Hassan Rowan most recent call for bedside commode request was made on 04/20/19.  This RNCM has not heard back from primary MD office regarding request.  Hassan Rowan confirmed RNCM's call back phone number and states she will submit the request again for patients bedside commode high priority.   Telephone call to patient for assessment follow up. Unable to reach patient. HIPAA compliant voice message left with call back phone number.   PLAN; RNCM will attempt 2nd telephone call to patient within 4 business days.   [name] RN,BSN,CCM [facility] Telephonic  [phone]

## 2019-04-22 NOTE — Patient Outreach (Signed)
[facility]) Care Management  04/22/2019  Kurt Cross 05-30-1951 [id number]  Care coordination Referral date:03/09/19 Referral source:transition of care [place]  Voice mail message received from [place] with Dr. Bebe Shaggy office. She states she has sent the bedside commode request to both [facility] and [facility]  She is waiting for a response and will follow up  To assure order has been received. [name] Kitchen   PLAN:  RNCM will follow up with patient at next scheduled outreach.   [name] RN,BSN,CCM [facility] Telephonic  [phone]

## 2019-04-22 NOTE — Telephone Encounter (Signed)
I left [name] a voicemail. I let her know that I have reached out to both [facility] and [facility] to get pt his bedside commode. I advised that I am still waiting on a response. I have reached out to [facility] for a second time to see if we get the bedside commode for pt. I advised her to call back with any additional questions.

## 2019-04-27 ENCOUNTER — Ambulatory Visit: Payer: Self-pay

## 2019-04-28 ENCOUNTER — Other Ambulatory Visit: Payer: Self-pay

## 2019-04-28 ENCOUNTER — Ambulatory Visit: Payer: Self-pay

## 2019-04-28 NOTE — Patient Outreach (Signed)
[facility]) Care Management  04/28/2019  Kurt Cross 1951-09-21 [id number]   Received confirmation from SCAT eligibility that patient has been approved for services and will receive a certification packet in the mail. Contacted patient and informed him of above information.  BSW closing case at this time.   Ronn Melena, BSW Social Worker [phone]

## 2019-04-30 ENCOUNTER — Other Ambulatory Visit: Payer: Self-pay

## 2019-04-30 NOTE — Patient Outreach (Signed)
[facility]) Care Management  04/30/2019  Kurt Cross August 16, 1951 [id number]  Referral date:03/09/19 Referral source:transition of care Insurance:[employer]  Telephone call to patient regarding follow up. HIPAA verified.  Patient states he received a voice mail message from [facility].  He states he will have to call them back to verify if they were calling about his bedside commode.  Patient states he has not had therapy for a couple of weeks. He states he felt he was progressing with his therapy. He states he was able to stand up holding on to his wheelchair.  Patient states he has not heard back from the therapist regarding another scheduled visit. Patient states he has not heard anything regarding having in blood work and toenail clipping.  RNCM informed patient she would contact the home health agency to follow up regarding his therapy and referral for blood work/ toenail clipping.  RNCM contacted [facility] home [facility] and spoke with Sherri. Informed Sherri patient reports he has had any additional physical therapy visits and is unsure if these visits have been discontinued.  Sherri advised RNCM to speak with a Librarian, academic.  Sherri transferred call to voicemail for nurse Alyssa.  RNCM left message requesting return call regarding patients physical therapy follow up.    PLAN: RNCM will await return call from [facility] home health. If no return call will attempt telephone outreach again with [facility].   RNCM will follow up with patient within 2 business days.   [name] RN,BSN,CCM [facility] Telephonic  [phone]

## 2019-05-03 ENCOUNTER — Ambulatory Visit: Payer: Self-pay

## 2019-05-03 ENCOUNTER — Other Ambulatory Visit: Payer: Self-pay

## 2019-05-03 NOTE — Patient Outreach (Signed)
[place] [facility]) Care Management  05/03/2019  Kurt Cross 18-Jul-1951 [id number]  Care coordination Referral date:03/09/19 Insurance:[employer]  Voicemail message received from [facility] physical therapist with [facility].  Message states her last visit with patient was on April 16, 2019. She states she is currently waiting for patient to receive a bedside commode to restart physical therapy services.  She reports she has contacted patients primary MD regarding this.  She states patients home physical therapy is on hold for now.  Physical therapist stated patient knows how to do the home exercises that was provided to him and he is able to safely use sliding board for transfers. She states patients home physical therapy will resume once patient receives the bedside commode.  Telephone call to [facility] regarding order for bedside commode. Spoke with Tamela who states [facility] has received order for patients bedside commode. She states patient was contacted on 04/22/19 and 04/23/19.  She states [facility] spoke with patient on 04/23/19 and would await call back from patients wife.  She reports [facility] did not receive call back and attempted to call patient again on 04/26/19 and 04/27/19. Tamela states request for bedside commode was closed due to unsuccessful outreach attempts to patient.  RNCM requested request for bedside commode be initiated again.  Tamela states she spoke with Geni Bers in their referral support department who will reinstate request for commode again and will contact RNCM for follow up today.  RNCM contacted [place] physical therapist with [facility] to inform her of the reason for the delay regarding patients bedside commode.  Will have patient contact therapist once bedside commode received.  Telephone call to patient for care coordination follow up. Unable to reach patient. HIPAA compliant voice message left with call back phone number.   Second telephone call to patient for care coordination follow up. HIPAA verified.  RNCM explained to patient reason he has not received the bedside commode.  RNCM explained to patient [facility] has attempted to reach patient on several occasions unsuccessfully.  RNCM also explained to patient that his home physical therapy was placed on hold per [place] until patient receives bedside commode. Patient verbalized understanding.  RNCM informed patient it is important for him to answer/ return call from [facility] to receive his bedside commode. Patient verbalized understanding.    PLAN; RNCM will follow up with patient within 1 week. RNCM will follow up on patients care coordination needs as indicated.   [name] RN,BSN,CCM [facility] Telephonic  [phone]

## 2019-05-04 ENCOUNTER — Other Ambulatory Visit: Payer: Self-pay

## 2019-05-04 ENCOUNTER — Telehealth: Payer: Self-pay

## 2019-05-04 NOTE — Telephone Encounter (Signed)
Okay 

## 2019-05-04 NOTE — Telephone Encounter (Signed)
Pt's home health agency - [facility] - can draw the labs to check pt's kidney function. Okay to give verbal order for them to draw lab?

## 2019-05-04 NOTE — Patient Outreach (Signed)
[place] [facility]) Care Management  05/04/2019  Kurt Cross December 18, 1950 [id number]   Care coordination Referral date:03/09/19 [place]  Voice mail message received from [facility] with [name] regarding patients Bedside commode order.  Message left requesting return call from [facility].  RNCM returned call to [name].  Spoke with Jeneen Rinks who states Patient was called on yesterday by [name] to set up delivery.  Jeneen Rinks states they were unable to reach patient on home and alternate phone number.  Jeneen Rinks states message was left for patient to return call.  RNCM informed Jeneen Rinks she would follow up with patient. Telephone call to patient.  HIPAA verified.  RNCM explained to patient [name] attempted to reach him on yesterday regarding his bedside commode.  RNCM gave patient contact phone number to [name] and advised him to call to discuss and set up delivery  arrangements regarding his bedside commode. RNCM informed patient she would return call today to assure patient has made contact with [name]. Patient verbalized understanding.   PLAN:  RNCM will follow up with patient within 1 business day.   [name] RN,BSN,CCM [facility] Telephonic  [phone]

## 2019-05-04 NOTE — Telephone Encounter (Signed)
Spoke with [facility]. They will go out for a one-time blood draw for the CMP. Pt currently does not have nursing.

## 2019-05-05 ENCOUNTER — Other Ambulatory Visit: Payer: Self-pay

## 2019-05-05 NOTE — Patient Outreach (Signed)
[place] [facility]) Care Management  05/05/2019  KENTRAVIOUS LIPFORD [birth year] [id number]  Care coordination Referral date:03/09/19 [place]  Voice mail message received from [facility] with Dr. Bebe Shaggy office requesting return call. RNCM returned call to [facility].  Ronnie states office received notification from [facility]  that patient will receive blood draw within the next few days and [facility] has referred patient for toenail care.  RNCM contacted [facility] and spoke with [facility]. Judeen Hammans states patient is scheduled to have blood draw on 05/07/19.  Judeen Hammans states she does not have any noted information regarding patients toenail care. RNCM requested return call from [facility] nurse supervisor.  RNCM contacted patient.  Patient states he attempted to get through to [name] on yesterday regarding the bed side commode. Patient states, " I got through the switch board and was told I owe some money then call was disconnected."  RNCM advised patient to contact [name] and speak with someone directly regarding the delivery of his bedside commode.  Patient states he will try to call again today. RNCM informed patient that [facility] is scheduled to do his blood draw on 05/06/29.  Contact phone number for [facility] given to patient. Advised patient to call [facility] to confirm date of service for blood draw. Requested patient notify RNCM he has spoken with [name] and [facility].  PLAN;  RNCM will follow up with patient at next scheduled outreach.   [name] RN,BSN,CCM [facility] Telephonic  [phone]

## 2019-05-05 NOTE — Patient Outreach (Signed)
[place] [facility]) Care Management  05/05/2019  TALON REGALA 03-21-1951 [id number]  Care coordination Referral date:03/09/19 [place]  Telephone call to patient's primary MD office to inquire about home health order for blood work and toenail cutting for patient. [name] Kitchen  Spoke with Edd Arbour who states a referral place on 04/20/19 to home health.  RNCM inquired of which home health agency the referral was sent to.  Edd Arbour states she has no documentation of which home health agency was sent the referral.  States she will research further and call RNCM back.   RNCM called patient.  Unable to reach. HIPAA compliant voice message left with call back phone number.   PLAN; RNCM will await return call from patients primary MD office.  RNCM will attempt 2nd telephone call to patient within 4 business days.   [name] RN,BSN,CCM [facility] Telephonic  [phone]

## 2019-05-06 ENCOUNTER — Other Ambulatory Visit: Payer: Self-pay

## 2019-05-06 MED ORDER — METOLAZONE 2.5 MG PO TABS: 2.5000 mg | ORAL_TABLET | Freq: Every day | ORAL | 2 refills | Status: DC

## 2019-05-06 MED ORDER — TRAZODONE HCL 50 MG PO TABS: 100.0000 mg | ORAL_TABLET | Freq: Every evening | ORAL | 1 refills | Status: AC | PRN

## 2019-05-06 MED ORDER — ROSUVASTATIN CALCIUM 10 MG PO TABS: 10.0000 mg | ORAL_TABLET | Freq: Every day | ORAL | 1 refills | Status: DC

## 2019-05-06 NOTE — Patient Outreach (Signed)
[place] [facility]) Care Management  05/06/2019  NEHAL SHIVES 12/14/1950 [id number]  Care coordination:  Voice mail message received from [name] with [name] requesting return call.  RNCM attempted call back to Mr. Irvin x 2.  Unable to reach. Message left.   RNCM return call to [name] and spoke with [place].  Emi Holes states [name] has attempted to reach the patient x 2 within the past few days without success.  Emi Holes states [name] was calling to see if bedside commode would be picked up or delivered.  Emi Holes transferred call to [name] with [name]. Elberta Fortis states patient was called on 05/04/19 and 05/05/19 but patient did not answer phone, message was left. Elberta Fortis states patient's insurance will cover 80% of the bedside commode leaving a patient balance of $38.50.  He states patient can pick up bedside commode at the retail store or have delivered to home after $38.50 payment is made. States bedside commode would be delivered within 3 to 5 business days.  RNCM informed Elberta Fortis she will contact patient and inform him of this information.  RNCM contacted patient. HIPAA verified with patient.  Informed patient [name] attempted outreach call to him on 05/04/19 and 05/05/19.  Informed patient of 80% insurance for bedside commode and his copay of $38.50  Informed patient bedside commode can be delivered to his home once copay is made.  Informed patient if [name] is not able to get in contact with him they will close out order for bedside commode. Patient verbalized understanding. RNCM provided patient contact phone number for [name].  Advised patient to choose telephone prompt regarding medical equipment to speak with someone regarding the bedside commode.   PLAN; RNCM will follow up with patient at next scheduled outreach  [name] RN,BSN,CCM [facility] Telephonic  [phone]

## 2019-05-07 ENCOUNTER — Other Ambulatory Visit: Payer: Self-pay

## 2019-05-07 NOTE — Patient Outreach (Signed)
[place] [facility]) Care Management  05/07/2019  Kurt Cross 1951-01-25 [id number]  Care coordination:   Telephone call to [name] regarding bedside commode request.  Spoke with Mikeal Hawthorne with [name].  Mikeal Hawthorne states they spoke with patient on yesterday but patient states he would have to call back to make co payment for bedside commode.  Mikeal Hawthorne states [employer] has not received a return call from patient at this time.    PLAN;  RNCM will follow up with patient within 4 business days.   [name] RN,BSN,CCM [facility] Telephonic  [phone]

## 2019-05-11 ENCOUNTER — Telehealth: Payer: Self-pay | Admitting: Family Medicine

## 2019-05-11 NOTE — Telephone Encounter (Signed)
Copied from [place] [id number]. Topic: Quick Communication - See Telephone Encounter >> May 11, 2019  2:53 PM [name] wrote: CRM for notification. See Telephone encounter for: 05/11/19. Pt says that home health is not coming to the home any more.  Pt has not seen anyone since he was release from snf. Does pt need a new company? Cb is [phone]

## 2019-05-12 NOTE — Telephone Encounter (Signed)
Ronny im not so sure about how to help with this. Do you know much about this? Do we need new order from PCP?

## 2019-05-13 ENCOUNTER — Telehealth: Payer: Self-pay | Admitting: Family Medicine

## 2019-05-13 MED ORDER — METOLAZONE 2.5 MG PO TABS: 2.5000 mg | ORAL_TABLET | Freq: Every day | ORAL | 1 refills | Status: DC

## 2019-05-13 NOTE — Telephone Encounter (Signed)
Sent to [facility] pharm/thx dmf 

## 2019-05-13 NOTE — Telephone Encounter (Signed)
Copied from [place] [id number]. Topic: Quick Communication - Rx Refill/Question >> May 13, 2019 11:39 AM Yvette Rack wrote: Medication: metolazone (ZAROXOLYN) 2.5 MG tablet  Has the patient contacted their pharmacy? yes   Preferred Pharmacy (with phone number or street name): [place], [place] [phone] (Phone) [phone] (Fax)  Agent: Please be advised that RX refills may take up to 3 business days. We ask that you follow-up with your pharmacy.

## 2019-05-17 ENCOUNTER — Other Ambulatory Visit: Payer: Self-pay

## 2019-05-17 MED ORDER — ACCU-CHEK AVIVA PLUS VI STRP: ORAL_STRIP | 12 refills | Status: AC

## 2019-05-17 MED ORDER — ACCU-CHEK AVIVA DEVI: 0 refills | Status: AC

## 2019-05-17 MED ORDER — ACCU-CHEK SOFTCLIX LANCETS MISC: 12 refills | Status: AC

## 2019-05-17 NOTE — Patient Outreach (Signed)
[place] [facility]) Care Management  05/17/2019  Kurt Cross Feb 13, 1951 [id number]  Care coordination:   Voicemail message received from patient stating the nurse from [facility] didn't show up to do his blood work/ toenail maintenance.   Patient states per voicemail message he spoke with [name] regarding his bedside commode. He stated he will try to make arrangements to pay his copay this week so that commode can be delivered.  RNCM returned call to patient on 05/10/19.  Patient confirmed/ restated the [facility] nurse did not come out for blood work and he will follow up regarding his bedside commode and copayment.   RNCM informed patient she would follow up with [facility] home health. Patient verbalized understanding.   Telephone call to [facility] home health for follow up regarding patient's home health services.  Spoke with [place] who states she does not see an order for patient to have blood draw's and toenail maintenance.  Mason Jim states she will send an email to Claudina Lick or Ms. Otila Kluver requesting they return call to this RNCM.  Telephone call to patients primary MD office. Spoke with Laurey Arrow and Judson Roch regarding patients home health order for blood draw and toenail maintenance. Judson Roch states she attempted contact with [facility] to determine status of order.  Judson Roch states if she does not hear from [facility] by tomorrow she will send a new order request to [facility] for blood draw and toenail maintenance for patient. RNCM requested a return call  from Judson Roch to inform her of home health order.  RNCM attempted call to patient. Unable to reach. HIPAA compliant voice message left with call back phone number.   PLAN; RNCM will await return call from [place] with patients primary MD office.  RNCM will attempt follow up call with patient within 4 business days.   [name] RN,BSN,CCM [facility] Telephonic  [phone]

## 2019-05-17 NOTE — Telephone Encounter (Signed)
Needs to be receiving diuretics and carvedilol from cardiology.

## 2019-05-18 ENCOUNTER — Other Ambulatory Visit: Payer: Self-pay

## 2019-05-18 DIAGNOSIS — I509 Heart failure, unspecified: Secondary | ICD-10-CM

## 2019-05-18 DIAGNOSIS — IMO0001 Reserved for inherently not codable concepts without codable children: Secondary | ICD-10-CM

## 2019-05-18 NOTE — Patient Outreach (Signed)
[place] [facility]) Care Management  05/18/2019  Kurt Cross Jun 13, 1951 [id number]  Care coordination:   In basket message received from [place] with Dr. Bebe Shaggy office stating she has not heard back from [facility] therefore will proceed with referring patient to [facility] for lab work and toenail maintenance.    PLAN; RNCM will follow up with patient at next scheduled outreach.  [name] RN,BSN,CCM [facility] Telephonic  [phone]

## 2019-05-18 NOTE — Addendum Note (Signed)
Addended by: Rodrigo Ran on: 05/18/2019 01:58 PM   Modules accepted: Orders

## 2019-05-19 ENCOUNTER — Telehealth: Payer: Self-pay | Admitting: Family Medicine

## 2019-05-19 ENCOUNTER — Other Ambulatory Visit: Payer: Self-pay

## 2019-05-19 NOTE — Patient Outreach (Signed)
[facility]) Care Management  05/19/2019  RAYN AMACKER 1951/02/23 [id number]  Telephone assessment:  In basket message received from [place] with Dr. Bebe Shaggy office stating [facility] will be able to see patient for blood draw. Judson Roch states she will work on having patient followed up regarding the request for toe nail maintenance.  Judson Roch states she also received a response from [facility] stating they are getting things together to send someone out to see him, they are also waiting to hear back from the Podiatrist about seeing him.  RNCM called patient for assessment follow up.  Patient states he is doing ok.  Patient reports he will pay his copayment for the bedside commode with [facility] today.  He reports he will contact this RNCM if [facility] is unable to deliver the bedside commode to his home.   Patient states he attempted to contact [facility] twice but has not heard back from them.   PLAN: RNCM will continue to follow patient to assist with care coordination needs.   [name] RN,BSN,CCM [facility] Telephonic  [phone]

## 2019-05-19 NOTE — Telephone Encounter (Signed)
Caller name: Kerry Dory  Relation to pt: [facility] [place]  Call back number: [phone]    Reason for call:  Please remove d/c from [place] and PT Order in epic.

## 2019-05-20 ENCOUNTER — Other Ambulatory Visit: Payer: Self-pay

## 2019-05-20 DIAGNOSIS — I5020 Unspecified systolic (congestive) heart failure: Secondary | ICD-10-CM | POA: Diagnosis not present

## 2019-05-20 DIAGNOSIS — N184 Chronic kidney disease, stage 4 (severe): Secondary | ICD-10-CM | POA: Diagnosis not present

## 2019-05-20 DIAGNOSIS — E11621 Type 2 diabetes mellitus with foot ulcer: Secondary | ICD-10-CM | POA: Diagnosis not present

## 2019-05-20 NOTE — Patient Outreach (Signed)
[place] [facility]) Care Management  05/20/2019  Kurt Cross [birth year] [id number]  Care coordination:  Voice mail message received from patient stating he paid the copy for his bed side commode and it will be delivered.   PLAN; RNCM will follow up with patient at next scheduled outreach   [name] RN,BSN,CCM [facility] Telephonic  [phone]

## 2019-05-20 NOTE — Telephone Encounter (Signed)
Are you able to change this in the notes or do I need to enter a new order?

## 2019-05-20 NOTE — Addendum Note (Signed)
Addended by: Nathanial Millman E on: 05/20/2019 11:06 AM   Modules accepted: Orders

## 2019-05-20 NOTE — Telephone Encounter (Signed)
Completed.

## 2019-05-20 NOTE — Telephone Encounter (Signed)
You will have to do it sorry

## 2019-05-21 ENCOUNTER — Ambulatory Visit: Payer: Self-pay

## 2019-05-22 DIAGNOSIS — M109 Gout, unspecified: Secondary | ICD-10-CM | POA: Diagnosis not present

## 2019-05-22 DIAGNOSIS — E1140 Type 2 diabetes mellitus with diabetic neuropathy, unspecified: Secondary | ICD-10-CM | POA: Diagnosis not present

## 2019-05-22 DIAGNOSIS — I130 Hypertensive heart and chronic kidney disease with heart failure and stage 1 through stage 4 chronic kidney disease, or unspecified chronic kidney disease: Secondary | ICD-10-CM | POA: Diagnosis not present

## 2019-05-22 DIAGNOSIS — N184 Chronic kidney disease, stage 4 (severe): Secondary | ICD-10-CM | POA: Diagnosis not present

## 2019-05-22 DIAGNOSIS — E1122 Type 2 diabetes mellitus with diabetic chronic kidney disease: Secondary | ICD-10-CM | POA: Diagnosis not present

## 2019-05-22 DIAGNOSIS — J9611 Chronic respiratory failure with hypoxia: Secondary | ICD-10-CM | POA: Diagnosis not present

## 2019-05-22 DIAGNOSIS — I4892 Unspecified atrial flutter: Secondary | ICD-10-CM | POA: Diagnosis not present

## 2019-05-22 DIAGNOSIS — I4891 Unspecified atrial fibrillation: Secondary | ICD-10-CM | POA: Diagnosis not present

## 2019-05-22 DIAGNOSIS — I5042 Chronic combined systolic (congestive) and diastolic (congestive) heart failure: Secondary | ICD-10-CM | POA: Diagnosis not present

## 2019-05-23 DIAGNOSIS — I4891 Unspecified atrial fibrillation: Secondary | ICD-10-CM | POA: Diagnosis not present

## 2019-05-23 DIAGNOSIS — N184 Chronic kidney disease, stage 4 (severe): Secondary | ICD-10-CM | POA: Diagnosis not present

## 2019-05-23 DIAGNOSIS — M109 Gout, unspecified: Secondary | ICD-10-CM | POA: Diagnosis not present

## 2019-05-23 DIAGNOSIS — J9611 Chronic respiratory failure with hypoxia: Secondary | ICD-10-CM | POA: Diagnosis not present

## 2019-05-23 DIAGNOSIS — I5042 Chronic combined systolic (congestive) and diastolic (congestive) heart failure: Secondary | ICD-10-CM | POA: Diagnosis not present

## 2019-05-23 DIAGNOSIS — E1140 Type 2 diabetes mellitus with diabetic neuropathy, unspecified: Secondary | ICD-10-CM | POA: Diagnosis not present

## 2019-05-23 DIAGNOSIS — I130 Hypertensive heart and chronic kidney disease with heart failure and stage 1 through stage 4 chronic kidney disease, or unspecified chronic kidney disease: Secondary | ICD-10-CM | POA: Diagnosis not present

## 2019-05-23 DIAGNOSIS — E1122 Type 2 diabetes mellitus with diabetic chronic kidney disease: Secondary | ICD-10-CM | POA: Diagnosis not present

## 2019-05-23 DIAGNOSIS — I4892 Unspecified atrial flutter: Secondary | ICD-10-CM | POA: Diagnosis not present

## 2019-05-24 NOTE — Telephone Encounter (Signed)
Okay 

## 2019-05-24 NOTE — Telephone Encounter (Signed)
Verbal okay? 

## 2019-05-24 NOTE — Telephone Encounter (Signed)
Home Health Verbal Orders - Caller[facility]Agency[facility]  Callback Number: [phone] Requesting Skilled nursing   Frequency: 2x2 1x7  Home health Aid: 2x3   Social work evaluation.

## 2019-05-24 NOTE — Telephone Encounter (Signed)
I left Crystal a voicemail to call the office back for verbal.

## 2019-05-25 DIAGNOSIS — J9611 Chronic respiratory failure with hypoxia: Secondary | ICD-10-CM | POA: Diagnosis not present

## 2019-05-25 DIAGNOSIS — M109 Gout, unspecified: Secondary | ICD-10-CM | POA: Diagnosis not present

## 2019-05-25 DIAGNOSIS — I130 Hypertensive heart and chronic kidney disease with heart failure and stage 1 through stage 4 chronic kidney disease, or unspecified chronic kidney disease: Secondary | ICD-10-CM | POA: Diagnosis not present

## 2019-05-25 DIAGNOSIS — E1122 Type 2 diabetes mellitus with diabetic chronic kidney disease: Secondary | ICD-10-CM | POA: Diagnosis not present

## 2019-05-25 DIAGNOSIS — I4891 Unspecified atrial fibrillation: Secondary | ICD-10-CM | POA: Diagnosis not present

## 2019-05-25 DIAGNOSIS — I5042 Chronic combined systolic (congestive) and diastolic (congestive) heart failure: Secondary | ICD-10-CM | POA: Diagnosis not present

## 2019-05-25 DIAGNOSIS — I4892 Unspecified atrial flutter: Secondary | ICD-10-CM | POA: Diagnosis not present

## 2019-05-25 DIAGNOSIS — E1140 Type 2 diabetes mellitus with diabetic neuropathy, unspecified: Secondary | ICD-10-CM | POA: Diagnosis not present

## 2019-05-25 DIAGNOSIS — N184 Chronic kidney disease, stage 4 (severe): Secondary | ICD-10-CM | POA: Diagnosis not present

## 2019-05-26 NOTE — Telephone Encounter (Signed)
Verbal given to Crystal.

## 2019-05-27 DIAGNOSIS — E1122 Type 2 diabetes mellitus with diabetic chronic kidney disease: Secondary | ICD-10-CM | POA: Diagnosis not present

## 2019-05-27 DIAGNOSIS — J9611 Chronic respiratory failure with hypoxia: Secondary | ICD-10-CM | POA: Diagnosis not present

## 2019-05-27 DIAGNOSIS — I5042 Chronic combined systolic (congestive) and diastolic (congestive) heart failure: Secondary | ICD-10-CM | POA: Diagnosis not present

## 2019-05-27 DIAGNOSIS — E1140 Type 2 diabetes mellitus with diabetic neuropathy, unspecified: Secondary | ICD-10-CM | POA: Diagnosis not present

## 2019-05-27 DIAGNOSIS — I130 Hypertensive heart and chronic kidney disease with heart failure and stage 1 through stage 4 chronic kidney disease, or unspecified chronic kidney disease: Secondary | ICD-10-CM | POA: Diagnosis not present

## 2019-05-27 DIAGNOSIS — N184 Chronic kidney disease, stage 4 (severe): Secondary | ICD-10-CM | POA: Diagnosis not present

## 2019-05-27 DIAGNOSIS — M109 Gout, unspecified: Secondary | ICD-10-CM | POA: Diagnosis not present

## 2019-05-27 DIAGNOSIS — I4892 Unspecified atrial flutter: Secondary | ICD-10-CM | POA: Diagnosis not present

## 2019-05-27 DIAGNOSIS — I4891 Unspecified atrial fibrillation: Secondary | ICD-10-CM | POA: Diagnosis not present

## 2019-05-28 DIAGNOSIS — E1140 Type 2 diabetes mellitus with diabetic neuropathy, unspecified: Secondary | ICD-10-CM | POA: Diagnosis not present

## 2019-05-28 DIAGNOSIS — I130 Hypertensive heart and chronic kidney disease with heart failure and stage 1 through stage 4 chronic kidney disease, or unspecified chronic kidney disease: Secondary | ICD-10-CM | POA: Diagnosis not present

## 2019-05-28 DIAGNOSIS — N184 Chronic kidney disease, stage 4 (severe): Secondary | ICD-10-CM | POA: Diagnosis not present

## 2019-05-28 DIAGNOSIS — M109 Gout, unspecified: Secondary | ICD-10-CM | POA: Diagnosis not present

## 2019-05-28 DIAGNOSIS — I4892 Unspecified atrial flutter: Secondary | ICD-10-CM | POA: Diagnosis not present

## 2019-05-28 DIAGNOSIS — E1122 Type 2 diabetes mellitus with diabetic chronic kidney disease: Secondary | ICD-10-CM | POA: Diagnosis not present

## 2019-05-28 DIAGNOSIS — I5042 Chronic combined systolic (congestive) and diastolic (congestive) heart failure: Secondary | ICD-10-CM | POA: Diagnosis not present

## 2019-05-28 DIAGNOSIS — J9611 Chronic respiratory failure with hypoxia: Secondary | ICD-10-CM | POA: Diagnosis not present

## 2019-05-28 DIAGNOSIS — I4891 Unspecified atrial fibrillation: Secondary | ICD-10-CM | POA: Diagnosis not present

## 2019-05-31 ENCOUNTER — Other Ambulatory Visit: Payer: Self-pay

## 2019-05-31 ENCOUNTER — Ambulatory Visit (INDEPENDENT_AMBULATORY_CARE_PROVIDER_SITE_OTHER): Payer: Medicare HMO | Admitting: *Deleted

## 2019-05-31 DIAGNOSIS — I4821 Permanent atrial fibrillation: Secondary | ICD-10-CM

## 2019-05-31 DIAGNOSIS — Z5181 Encounter for therapeutic drug level monitoring: Secondary | ICD-10-CM | POA: Diagnosis not present

## 2019-05-31 DIAGNOSIS — R6889 Other general symptoms and signs: Secondary | ICD-10-CM | POA: Diagnosis not present

## 2019-05-31 LAB — POCT INR: INR: 1.3 — AB (ref 2.0–3.0)

## 2019-05-31 NOTE — Patient Instructions (Signed)
Description   Today take another 1/2 tablet (total of 1.5 tablets) and tomorrow take 1.5 tablets then continue taking 1 tablet daily. Recheck INR in 1 week in the office. [facility] [phone]

## 2019-06-01 ENCOUNTER — Other Ambulatory Visit: Payer: Self-pay

## 2019-06-01 DIAGNOSIS — I5042 Chronic combined systolic (congestive) and diastolic (congestive) heart failure: Secondary | ICD-10-CM | POA: Diagnosis not present

## 2019-06-01 DIAGNOSIS — I130 Hypertensive heart and chronic kidney disease with heart failure and stage 1 through stage 4 chronic kidney disease, or unspecified chronic kidney disease: Secondary | ICD-10-CM | POA: Diagnosis not present

## 2019-06-01 DIAGNOSIS — J9611 Chronic respiratory failure with hypoxia: Secondary | ICD-10-CM | POA: Diagnosis not present

## 2019-06-01 DIAGNOSIS — N184 Chronic kidney disease, stage 4 (severe): Secondary | ICD-10-CM | POA: Diagnosis not present

## 2019-06-01 DIAGNOSIS — I4892 Unspecified atrial flutter: Secondary | ICD-10-CM | POA: Diagnosis not present

## 2019-06-01 DIAGNOSIS — M109 Gout, unspecified: Secondary | ICD-10-CM | POA: Diagnosis not present

## 2019-06-01 DIAGNOSIS — E1122 Type 2 diabetes mellitus with diabetic chronic kidney disease: Secondary | ICD-10-CM | POA: Diagnosis not present

## 2019-06-01 DIAGNOSIS — E1140 Type 2 diabetes mellitus with diabetic neuropathy, unspecified: Secondary | ICD-10-CM | POA: Diagnosis not present

## 2019-06-01 DIAGNOSIS — I4891 Unspecified atrial fibrillation: Secondary | ICD-10-CM | POA: Diagnosis not present

## 2019-06-01 NOTE — Patient Outreach (Signed)
      [facility]) Care Management  06/01/2019  JARRAH DESSER 1951/02/09 [id number]  Telephone assessment:   Telephone call to patient for follow up. HIPAA verified with patient. Patient states he received his bed side commode and is receiving physical therapy, nursing and home health aid with [facility] home health. Patient states he had an appointment with his primary MD on yesterday 05/31/19 and had blood drawn. He reports his coumadin levels were low because he did not take his medication a few days ago.  Patient states, " It was just one of those days. I didn't feel that good and I just didn't take my medication."  Patient states he knows what to do. He reports he receives his medications by mail."  Patient states his wheelchair needs to be repaired. He reports the brake on the left side of the wheelchair does not lock and the chair sits back too far.  Patient states he also needs a new mattress for his bariatric bed.  Patient states his wheelchair is approximately 97 1/66 years old and the bariatric bed is 66 years old. RNCM informed patient she will provide care coordination assistancet with  Medical equipment repair/ replacement  Patient denies any further needs at this time.   PLAN; RNCM will follow up with patient within 1 week RNCM will contact [facility] for medical equipment repair/ replacement.   [name] RN,BSN,CCM [facility] Telephonic  [phone]

## 2019-06-02 ENCOUNTER — Telehealth: Payer: Self-pay | Admitting: Family Medicine

## 2019-06-02 DIAGNOSIS — I4891 Unspecified atrial fibrillation: Secondary | ICD-10-CM | POA: Diagnosis not present

## 2019-06-02 DIAGNOSIS — I4892 Unspecified atrial flutter: Secondary | ICD-10-CM | POA: Diagnosis not present

## 2019-06-02 DIAGNOSIS — N184 Chronic kidney disease, stage 4 (severe): Secondary | ICD-10-CM | POA: Diagnosis not present

## 2019-06-02 DIAGNOSIS — E1140 Type 2 diabetes mellitus with diabetic neuropathy, unspecified: Secondary | ICD-10-CM | POA: Diagnosis not present

## 2019-06-02 DIAGNOSIS — E1122 Type 2 diabetes mellitus with diabetic chronic kidney disease: Secondary | ICD-10-CM | POA: Diagnosis not present

## 2019-06-02 DIAGNOSIS — I5042 Chronic combined systolic (congestive) and diastolic (congestive) heart failure: Secondary | ICD-10-CM | POA: Diagnosis not present

## 2019-06-02 DIAGNOSIS — M109 Gout, unspecified: Secondary | ICD-10-CM | POA: Diagnosis not present

## 2019-06-02 DIAGNOSIS — J9611 Chronic respiratory failure with hypoxia: Secondary | ICD-10-CM | POA: Diagnosis not present

## 2019-06-02 DIAGNOSIS — I130 Hypertensive heart and chronic kidney disease with heart failure and stage 1 through stage 4 chronic kidney disease, or unspecified chronic kidney disease: Secondary | ICD-10-CM | POA: Diagnosis not present

## 2019-06-02 MED ORDER — COLCHICINE 0.6 MG PO TABS: 0.6000 mg | ORAL_TABLET | Freq: Every day | ORAL | 2 refills | Status: DC

## 2019-06-02 NOTE — Telephone Encounter (Signed)
Medication Refill - Medication:  Colcrys  Has the patient contacted their pharmacy? No - he can't figure out how to order through mail order and was told the office needs to request a refill (Agent: If no, request that the patient contact the pharmacy for the refill.) (Agent: If yes, when and what did the pharmacy advise?)  Preferred Pharmacy (with phone number or street name):  [place], [place] [phone] (Phone) [phone] (Fax)   Agent: Please be advised that RX refills may take up to 3 business days. We ask that you follow-up with your pharmacy.

## 2019-06-03 DIAGNOSIS — E1122 Type 2 diabetes mellitus with diabetic chronic kidney disease: Secondary | ICD-10-CM | POA: Diagnosis not present

## 2019-06-03 DIAGNOSIS — I130 Hypertensive heart and chronic kidney disease with heart failure and stage 1 through stage 4 chronic kidney disease, or unspecified chronic kidney disease: Secondary | ICD-10-CM | POA: Diagnosis not present

## 2019-06-03 DIAGNOSIS — E1140 Type 2 diabetes mellitus with diabetic neuropathy, unspecified: Secondary | ICD-10-CM | POA: Diagnosis not present

## 2019-06-03 DIAGNOSIS — M109 Gout, unspecified: Secondary | ICD-10-CM | POA: Diagnosis not present

## 2019-06-03 DIAGNOSIS — I4891 Unspecified atrial fibrillation: Secondary | ICD-10-CM | POA: Diagnosis not present

## 2019-06-03 DIAGNOSIS — J9611 Chronic respiratory failure with hypoxia: Secondary | ICD-10-CM | POA: Diagnosis not present

## 2019-06-03 DIAGNOSIS — I5042 Chronic combined systolic (congestive) and diastolic (congestive) heart failure: Secondary | ICD-10-CM | POA: Diagnosis not present

## 2019-06-03 DIAGNOSIS — N184 Chronic kidney disease, stage 4 (severe): Secondary | ICD-10-CM | POA: Diagnosis not present

## 2019-06-03 DIAGNOSIS — I4892 Unspecified atrial flutter: Secondary | ICD-10-CM | POA: Diagnosis not present

## 2019-06-04 ENCOUNTER — Other Ambulatory Visit: Payer: Self-pay

## 2019-06-04 DIAGNOSIS — J9611 Chronic respiratory failure with hypoxia: Secondary | ICD-10-CM | POA: Diagnosis not present

## 2019-06-04 DIAGNOSIS — E1140 Type 2 diabetes mellitus with diabetic neuropathy, unspecified: Secondary | ICD-10-CM | POA: Diagnosis not present

## 2019-06-04 DIAGNOSIS — E1122 Type 2 diabetes mellitus with diabetic chronic kidney disease: Secondary | ICD-10-CM | POA: Diagnosis not present

## 2019-06-04 DIAGNOSIS — I130 Hypertensive heart and chronic kidney disease with heart failure and stage 1 through stage 4 chronic kidney disease, or unspecified chronic kidney disease: Secondary | ICD-10-CM | POA: Diagnosis not present

## 2019-06-04 DIAGNOSIS — N184 Chronic kidney disease, stage 4 (severe): Secondary | ICD-10-CM | POA: Diagnosis not present

## 2019-06-04 DIAGNOSIS — M109 Gout, unspecified: Secondary | ICD-10-CM | POA: Diagnosis not present

## 2019-06-04 DIAGNOSIS — I4891 Unspecified atrial fibrillation: Secondary | ICD-10-CM | POA: Diagnosis not present

## 2019-06-04 DIAGNOSIS — I4892 Unspecified atrial flutter: Secondary | ICD-10-CM | POA: Diagnosis not present

## 2019-06-04 DIAGNOSIS — I5042 Chronic combined systolic (congestive) and diastolic (congestive) heart failure: Secondary | ICD-10-CM | POA: Diagnosis not present

## 2019-06-04 NOTE — Patient Outreach (Signed)
[place] [facility]) Care Management  06/04/2019  EBONY SYVERTSON Oct 30, 1950 [id number]  Care coordination: Telephone call to [name] regarding patient need for wheelchair repairs and new bariatric mattress.  Spoke with Ivin Booty with [name].  She reports patient received bariatric bed and manual wheelchair over 5 years ago. She states February 2020 would have been 5 years for both bariatric bed and wheelchair. Discussed with Ivin Booty that is may be best for patient to have a new manual wheelchair at this time.  Ivin Booty states a doctors order for both bariatric  mattress and bariatric manual wheelchair along with several office visit notes would need to be faxed to [facility] at [phone].  RNCM contacted patients primary MD office, Dr. Ethelene Hal and spoke with Luetta Nutting.  Requested order from patients primary MD for new bariatric mattress and bariatric manual wheelchair along with several office visit notes to be faxed to [place].  Fax number provided for [name] provided.   PLAN:  RNCM will follow up with primary MD/ [name] to ensure orders secured within 1 week.   [name] RN,BSN,CCM [facility] Telephonic  [phone]

## 2019-06-07 ENCOUNTER — Ambulatory Visit: Payer: Self-pay

## 2019-06-07 DIAGNOSIS — I5042 Chronic combined systolic (congestive) and diastolic (congestive) heart failure: Secondary | ICD-10-CM | POA: Diagnosis not present

## 2019-06-07 DIAGNOSIS — I4891 Unspecified atrial fibrillation: Secondary | ICD-10-CM | POA: Diagnosis not present

## 2019-06-07 DIAGNOSIS — I4892 Unspecified atrial flutter: Secondary | ICD-10-CM | POA: Diagnosis not present

## 2019-06-07 DIAGNOSIS — J9611 Chronic respiratory failure with hypoxia: Secondary | ICD-10-CM | POA: Diagnosis not present

## 2019-06-07 DIAGNOSIS — M109 Gout, unspecified: Secondary | ICD-10-CM | POA: Diagnosis not present

## 2019-06-07 DIAGNOSIS — N184 Chronic kidney disease, stage 4 (severe): Secondary | ICD-10-CM | POA: Diagnosis not present

## 2019-06-07 DIAGNOSIS — I130 Hypertensive heart and chronic kidney disease with heart failure and stage 1 through stage 4 chronic kidney disease, or unspecified chronic kidney disease: Secondary | ICD-10-CM | POA: Diagnosis not present

## 2019-06-07 DIAGNOSIS — E1122 Type 2 diabetes mellitus with diabetic chronic kidney disease: Secondary | ICD-10-CM | POA: Diagnosis not present

## 2019-06-07 DIAGNOSIS — E1140 Type 2 diabetes mellitus with diabetic neuropathy, unspecified: Secondary | ICD-10-CM | POA: Diagnosis not present

## 2019-06-08 DIAGNOSIS — E1140 Type 2 diabetes mellitus with diabetic neuropathy, unspecified: Secondary | ICD-10-CM | POA: Diagnosis not present

## 2019-06-08 DIAGNOSIS — I4892 Unspecified atrial flutter: Secondary | ICD-10-CM | POA: Diagnosis not present

## 2019-06-08 DIAGNOSIS — J9611 Chronic respiratory failure with hypoxia: Secondary | ICD-10-CM | POA: Diagnosis not present

## 2019-06-08 DIAGNOSIS — I4891 Unspecified atrial fibrillation: Secondary | ICD-10-CM | POA: Diagnosis not present

## 2019-06-08 DIAGNOSIS — I5042 Chronic combined systolic (congestive) and diastolic (congestive) heart failure: Secondary | ICD-10-CM | POA: Diagnosis not present

## 2019-06-08 DIAGNOSIS — E1122 Type 2 diabetes mellitus with diabetic chronic kidney disease: Secondary | ICD-10-CM | POA: Diagnosis not present

## 2019-06-08 DIAGNOSIS — I130 Hypertensive heart and chronic kidney disease with heart failure and stage 1 through stage 4 chronic kidney disease, or unspecified chronic kidney disease: Secondary | ICD-10-CM | POA: Diagnosis not present

## 2019-06-08 DIAGNOSIS — N184 Chronic kidney disease, stage 4 (severe): Secondary | ICD-10-CM | POA: Diagnosis not present

## 2019-06-08 DIAGNOSIS — M109 Gout, unspecified: Secondary | ICD-10-CM | POA: Diagnosis not present

## 2019-06-08 MED ORDER — COLCHICINE 0.6 MG PO TABS: 0.6000 mg | ORAL_TABLET | Freq: Every day | ORAL | 2 refills | Status: DC

## 2019-06-08 NOTE — Telephone Encounter (Signed)
Pt called and stated that he would like medication sent to [facility], pt states that we sent to [place].

## 2019-06-08 NOTE — Telephone Encounter (Signed)
Rx sent to [facility]

## 2019-06-08 NOTE — Addendum Note (Signed)
Addended by: Rodrigo Ran on: 06/08/2019 08:44 AM   Modules accepted: Orders

## 2019-06-08 NOTE — Progress Notes (Signed)
Patient has mobility limitations which impairs their ability to perform daily activities like toileting in the home. A walking device will not resolve the issue sufficiently with performing activities of daily living. A wheelchair will allow the patient to safely perform daily activities. Patient can safely self-propel the wheelchair in the home or has a caregiver that can provide assistance.

## 2019-06-09 ENCOUNTER — Telehealth: Payer: Self-pay | Admitting: Family Medicine

## 2019-06-09 ENCOUNTER — Other Ambulatory Visit: Payer: Self-pay

## 2019-06-09 DIAGNOSIS — IMO0001 Reserved for inherently not codable concepts without codable children: Secondary | ICD-10-CM

## 2019-06-09 DIAGNOSIS — N184 Chronic kidney disease, stage 4 (severe): Secondary | ICD-10-CM | POA: Diagnosis not present

## 2019-06-09 DIAGNOSIS — E1122 Type 2 diabetes mellitus with diabetic chronic kidney disease: Secondary | ICD-10-CM | POA: Diagnosis not present

## 2019-06-09 DIAGNOSIS — I4891 Unspecified atrial fibrillation: Secondary | ICD-10-CM | POA: Diagnosis not present

## 2019-06-09 DIAGNOSIS — I509 Heart failure, unspecified: Secondary | ICD-10-CM

## 2019-06-09 DIAGNOSIS — I5042 Chronic combined systolic (congestive) and diastolic (congestive) heart failure: Secondary | ICD-10-CM | POA: Diagnosis not present

## 2019-06-09 DIAGNOSIS — J9611 Chronic respiratory failure with hypoxia: Secondary | ICD-10-CM | POA: Diagnosis not present

## 2019-06-09 DIAGNOSIS — I4892 Unspecified atrial flutter: Secondary | ICD-10-CM | POA: Diagnosis not present

## 2019-06-09 DIAGNOSIS — I130 Hypertensive heart and chronic kidney disease with heart failure and stage 1 through stage 4 chronic kidney disease, or unspecified chronic kidney disease: Secondary | ICD-10-CM | POA: Diagnosis not present

## 2019-06-09 DIAGNOSIS — M109 Gout, unspecified: Secondary | ICD-10-CM | POA: Diagnosis not present

## 2019-06-09 DIAGNOSIS — E1140 Type 2 diabetes mellitus with diabetic neuropathy, unspecified: Secondary | ICD-10-CM | POA: Diagnosis not present

## 2019-06-09 NOTE — Telephone Encounter (Signed)
Anne Ng called to let Dr. Ethelene Hal know that the Pt refused his nurse visit on 05/28/19

## 2019-06-09 NOTE — Telephone Encounter (Signed)
fyi

## 2019-06-09 NOTE — Patient Outreach (Signed)
[place] [facility]) Care Management  06/09/2019   MILEN TREECE Nov 01, 1950 [id number]  Telephone assessment follow up.  Subjective: Telephone call to [name] regarding patients DME request.  RNCM spoke with [place] who state the order for patients wheelchair and bariatric bed have been received. She reports the intake department has contacted patients primary MD for additional information.  RNCM contacted patient. HIPAA verified. Patient states, "Ms. Sanyah Molnar I am feeling really good."  Patient states he is going to have his lab work checked on 06/17/19.  Patient states he is doing is exercises and continues to work with the physical therapist. He states he is standing up with his wheelchair and is able to stand up at the sink with his wheelchair as well.  Patient denies any skin breakdown. He reports he continues to apply eucerin cream to his skin.  Patient denies any shortness of breath or extremity swelling. RNCM discussed heart failure action plan and zones. Patient reports he is in the Mairi Stagliano zone today. Patient states he is not able to weigh because he cannot stand for long periods of time.  Patient states he continues to adhere to a low salt diet.  He reports his wife cooks and does not use salt. He states his wife uses Mrs. Dash to season his food with.   RNCM inquired if patient had received his welcome packet/ consent and EMMI education material. Patient states he has not received.  RNCM will resend packet.   Current Medications:  Current Outpatient Medications  Medication Sig Dispense Refill  . Accu-Chek Softclix Lancets lancets Use to test blood sugars 1-2 times daily. 100 each 12  . Blood Glucose Monitoring Suppl (ACCU-CHEK AVIVA) device Use to test blood sugars 1-2 times daily. 1 each 0  . carvedilol (COREG) 25 MG tablet Take 25 mg by mouth 2 (two) times daily.  3  . colchicine 0.6 MG tablet Take 1 tablet (0.6 mg total) by mouth daily. Pt states he is taking 1/2 tablet daily (  since 03/11/2019) 90 tablet 2  . cyclobenzaprine (FLEXERIL) 5 MG tablet Take 5 mg by mouth 2 (two) times a day.    . furosemide (LASIX) 80 MG tablet Take 80 mg by mouth daily.    [name] Kitchen gabapentin (NEURONTIN) 300 MG capsule Take 300 mg by mouth 3 (three) times daily.     [name] Kitchen glucose blood (ACCU-CHEK AVIVA PLUS) test strip Use to test blood sugars 1-2 times daily. 100 each 12  . insulin NPH Human (NOVOLIN N) 100 UNIT/ML injection Inject 10-20 Units into the skin 2 (two) times daily before a meal.    . insulin regular (NOVOLIN R) 100 units/mL injection Use with sliding scale. 10 mL 11  . Magnesium Oxide 200 MG TABS Take 1 tablet (200 mg total) by mouth daily. 90 tablet 3  . metolazone (ZAROXOLYN) 2.5 MG tablet Take 1 tablet (2.5 mg total) by mouth daily. 90 tablet 1  . rosuvastatin (CRESTOR) 10 MG tablet Take 1 tablet (10 mg total) by mouth daily. 90 tablet 1  . Skin Protectants, Misc. (EUCERIN) cream Apply topically as needed for wound care. 397 g 5  . spironolactone (ALDACTONE) 25 MG tablet Take 1 tablet (25 mg total) by mouth daily. 90 tablet 3  . traZODone (DESYREL) 50 MG tablet Take 2 tablets (100 mg total) by mouth at bedtime as needed for sleep. 90 tablet 1  . warfarin (COUMADIN) 10 MG tablet Take 10 mg by mouth. Daily on Monday, Wednesday and Friday.  No current facility-administered medications for this visit.     Functional Status:  In your present state of health, do you have any difficulty performing the following activities: 04/01/2019 03/16/2019  Hearing? N N  Vision? N -  Difficulty concentrating or making decisions? N -  Walking or climbing stairs? Y Y  Dressing or bathing? Y Y  Doing errands, shopping? Tempie Donning  Preparing Food and eating ? Y Y  Using the Toilet? Y -  In the past six months, have you accidently leaked urine? N -  Do you have problems with loss of bowel control? N -  Managing your Medications? N -  Managing your Finances? N -  Housekeeping or managing your  Housekeeping? Y Y  Some recent data might be hidden    Fall/Depression Screening: Fall Risk  03/16/2019 11/05/2016 09/13/2016  Falls in the past year? 1 Yes Yes  Comment patient states he slipped out of wheelchair approximately 3 months ago  last fall date:  05/2016  -  Number falls in past yr: 0 1 2 or more  Comment - - -  Injury with Fall? 0 No No  Risk Factor Category  - - High Fall Risk  Risk for fall due to : History of fall(s);Impaired mobility Impaired mobility History of fall(s);Impaired balance/gait;Impaired mobility;Medication side effect  Follow up Falls prevention discussed Falls prevention discussed Falls evaluation completed;Education provided;Falls prevention discussed   PHQ 2/9 Scores 03/16/2019 11/16/2018 09/13/2016 09/04/2016  PHQ - 2 Score 0 0 0 0    Assessment:  Ongoing care coordination / disease management.   Plan: RNCM will follow up with patient within 2 weeks.   [name] RN,BSN,CCM [facility] Telephonic  [phone]

## 2019-06-10 DIAGNOSIS — I130 Hypertensive heart and chronic kidney disease with heart failure and stage 1 through stage 4 chronic kidney disease, or unspecified chronic kidney disease: Secondary | ICD-10-CM | POA: Diagnosis not present

## 2019-06-10 DIAGNOSIS — E1140 Type 2 diabetes mellitus with diabetic neuropathy, unspecified: Secondary | ICD-10-CM | POA: Diagnosis not present

## 2019-06-10 DIAGNOSIS — I4892 Unspecified atrial flutter: Secondary | ICD-10-CM | POA: Diagnosis not present

## 2019-06-10 DIAGNOSIS — E1122 Type 2 diabetes mellitus with diabetic chronic kidney disease: Secondary | ICD-10-CM | POA: Diagnosis not present

## 2019-06-10 DIAGNOSIS — N184 Chronic kidney disease, stage 4 (severe): Secondary | ICD-10-CM | POA: Diagnosis not present

## 2019-06-10 DIAGNOSIS — I4891 Unspecified atrial fibrillation: Secondary | ICD-10-CM | POA: Diagnosis not present

## 2019-06-10 DIAGNOSIS — M109 Gout, unspecified: Secondary | ICD-10-CM | POA: Diagnosis not present

## 2019-06-10 DIAGNOSIS — J9611 Chronic respiratory failure with hypoxia: Secondary | ICD-10-CM | POA: Diagnosis not present

## 2019-06-10 DIAGNOSIS — I5042 Chronic combined systolic (congestive) and diastolic (congestive) heart failure: Secondary | ICD-10-CM | POA: Diagnosis not present

## 2019-06-11 DIAGNOSIS — I4891 Unspecified atrial fibrillation: Secondary | ICD-10-CM | POA: Diagnosis not present

## 2019-06-11 DIAGNOSIS — I5042 Chronic combined systolic (congestive) and diastolic (congestive) heart failure: Secondary | ICD-10-CM | POA: Diagnosis not present

## 2019-06-11 DIAGNOSIS — I4892 Unspecified atrial flutter: Secondary | ICD-10-CM | POA: Diagnosis not present

## 2019-06-11 DIAGNOSIS — E1140 Type 2 diabetes mellitus with diabetic neuropathy, unspecified: Secondary | ICD-10-CM | POA: Diagnosis not present

## 2019-06-11 DIAGNOSIS — N184 Chronic kidney disease, stage 4 (severe): Secondary | ICD-10-CM | POA: Diagnosis not present

## 2019-06-11 DIAGNOSIS — M109 Gout, unspecified: Secondary | ICD-10-CM | POA: Diagnosis not present

## 2019-06-11 DIAGNOSIS — J9611 Chronic respiratory failure with hypoxia: Secondary | ICD-10-CM | POA: Diagnosis not present

## 2019-06-11 DIAGNOSIS — I130 Hypertensive heart and chronic kidney disease with heart failure and stage 1 through stage 4 chronic kidney disease, or unspecified chronic kidney disease: Secondary | ICD-10-CM | POA: Diagnosis not present

## 2019-06-11 DIAGNOSIS — E1122 Type 2 diabetes mellitus with diabetic chronic kidney disease: Secondary | ICD-10-CM | POA: Diagnosis not present

## 2019-06-15 DIAGNOSIS — E1140 Type 2 diabetes mellitus with diabetic neuropathy, unspecified: Secondary | ICD-10-CM | POA: Diagnosis not present

## 2019-06-15 DIAGNOSIS — I4891 Unspecified atrial fibrillation: Secondary | ICD-10-CM | POA: Diagnosis not present

## 2019-06-15 DIAGNOSIS — M109 Gout, unspecified: Secondary | ICD-10-CM | POA: Diagnosis not present

## 2019-06-15 DIAGNOSIS — I5042 Chronic combined systolic (congestive) and diastolic (congestive) heart failure: Secondary | ICD-10-CM | POA: Diagnosis not present

## 2019-06-15 DIAGNOSIS — I130 Hypertensive heart and chronic kidney disease with heart failure and stage 1 through stage 4 chronic kidney disease, or unspecified chronic kidney disease: Secondary | ICD-10-CM | POA: Diagnosis not present

## 2019-06-15 DIAGNOSIS — I4892 Unspecified atrial flutter: Secondary | ICD-10-CM | POA: Diagnosis not present

## 2019-06-15 DIAGNOSIS — J9611 Chronic respiratory failure with hypoxia: Secondary | ICD-10-CM | POA: Diagnosis not present

## 2019-06-15 DIAGNOSIS — E1122 Type 2 diabetes mellitus with diabetic chronic kidney disease: Secondary | ICD-10-CM | POA: Diagnosis not present

## 2019-06-15 DIAGNOSIS — N184 Chronic kidney disease, stage 4 (severe): Secondary | ICD-10-CM | POA: Diagnosis not present

## 2019-06-16 ENCOUNTER — Telehealth (INDEPENDENT_AMBULATORY_CARE_PROVIDER_SITE_OTHER): Payer: Medicare HMO | Admitting: Internal Medicine

## 2019-06-16 ENCOUNTER — Other Ambulatory Visit: Payer: Self-pay

## 2019-06-16 ENCOUNTER — Telehealth: Payer: Self-pay | Admitting: Family Medicine

## 2019-06-16 DIAGNOSIS — I5022 Chronic systolic (congestive) heart failure: Secondary | ICD-10-CM

## 2019-06-16 DIAGNOSIS — I442 Atrioventricular block, complete: Secondary | ICD-10-CM | POA: Diagnosis not present

## 2019-06-16 DIAGNOSIS — I4821 Permanent atrial fibrillation: Secondary | ICD-10-CM | POA: Diagnosis not present

## 2019-06-16 NOTE — Telephone Encounter (Signed)
Pt calling and states that medication has not been received and [facility] needs a call back from the office. Please advise

## 2019-06-16 NOTE — Telephone Encounter (Signed)
Lysbeth Galas from [facility] home health called stating patient did fall 9/6 to his knees getting out of his wheelchair.  Lysbeth Galas has order patient new wheelchair since he has been having trouble getting in and out. Lysbeth Galas call back [phone]

## 2019-06-16 NOTE — Telephone Encounter (Signed)
I called and spoke with pharmacy & pharmacist. Rx has been corrected & they will fill it for pt.

## 2019-06-16 NOTE — Telephone Encounter (Signed)
fyi

## 2019-06-16 NOTE — Progress Notes (Signed)
Electrophysiology TeleHealth Note  Due to national recommendations of social distancing due to [place] 19, an audio telehealth visit is felt to be most appropriate for this patient at this time.  Verbal consent was obtained by me for the telehealth visit today.  The patient does not have capability for a virtual visit.  A phone visit is therefore required today.   Date:  06/16/2019   ID:  Kurt Cross, DOB 1951/06/22, MRN [id number]  Location: patient's home  Provider location:  [facility]  Evaluation Performed: Follow-up visit  PCP:  [name], MD   Electrophysiologist:  Dr Rayann Heman  Chief Complaint:  palpitations  History of Present Illness:    Kurt Cross is a 68 y.o. male who presents via telehealth conferencing today.  Since last being seen in our clinic, the patient reports doing very well.  Today, he denies symptoms of palpitations, chest pain, shortness of breath,  lower extremity edema, dizziness, presyncope, or syncope.  He states "I feel good".  The patient is otherwise without complaint today.  The patient denies symptoms of fevers, chills, cough, or new SOB worrisome for COVID 19.  Past Medical History:  Diagnosis Date  . AAA (abdominal aortic aneurysm)/ 4.5 cm ascending per CT angio 08/01/13 08/12/2013  . Abscess 02/13/2018  . Acute on chronic renal failure (HCC) 01/27/2016   02/15/16 Na 130, K 4.6, Bun 47, creat 2.07    . Acute on chronic respiratory failure ([place]) 01/27/2016  . Acute on chronic respiratory failure with hypoxia ([place]) 01/27/2016   02/14/16 CXR no acute pulmonary abnormality, a nodule density noted at the right upper lobe, could represent a granuloma versus neoplastic lesion, may CT   . Acute renal failure (ARF) ([place]) 08/15/2013  . AKI (acute kidney injury) ([place]) 11/12/2013  . Atrial fibrillation ([place])   . Atrial flutter ([place]) 02/22/2016  . Benign hypertension   . Bradycardia   . BRONCHITIS, CHRONIC 09/01/2007   Qualifier: Diagnosis of  By:  Doy Mince LPN, Megan    . Cellulitis of left lower extremity 02/14/2018  . CHF (congestive heart failure), NYHA class II ([place])   . Chronic anticoagulation 02/22/2016  . Chronic combined systolic and diastolic CHF (congestive heart failure) ([place]) 08/04/2013   02/14/16 CXR no acute pulmonary abnormality, a nodule density noted at the right upper lobe, could represent a granuloma versus neoplastic lesion, may CT   . Chronic respiratory failure ([place]) 02/10/2016  . Chronic ulcer of left heel ([place]) 02/17/2018  . CKD (chronic kidney disease), stage III ([place]) 02/17/2018  . Complete heart block (HCC)    s/p PPM Bi-Ven  . Constipation, chronic 08/03/2013  . Debility 02/10/2016  . Decubitus ulcer of sacral region, stage 3 ([place]) 02/14/2018  . Diabetes mellitus, type 2 ([place])   . H/O medication noncompliance 02/22/2016  . Hernia of abdominal cavity   . Hyperkalemia 11/12/2013  . Hyperlipidemia   . Hypertension   . Hypokalemia   . Hypoxemia   . Hypoxia 01/26/2016  . 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 Kidney failure   . Neuropathy   . Obesity, Class III, BMI 40-49.9 (morbid obesity) ([place]) 09/01/2007   Qualifier: Diagnosis of  By: Doy Mince LPN, Megan    . Obstructive sleep apnea 09/01/2007   Qualifier: Diagnosis of  By: Doy Mince LPN, Megan    . OSA (obstructive sleep apnea)   . Recurrent  ventral incisional hernia 08/14/2013  . Right leg pain 08/11/2013  . S/P placement of cardiac pacemaker 02/22/2016  . Sepsis ([place]) 08/02/2013  . Short gut syndrome due to distal jejunal SB fistula 10/22/2013    Past Surgical History:  Procedure Laterality Date  . APPENDECTOMY    . biventricular pacemaker generator change  04/29/2014   MDT Viva CRT-P BiV pacemaker implanted by Dr Rolla Etienne in [place]  . biventricular pacemaker implantation  06/18/2007   MDT BIV pacemaker implanted by Dr Lovena Le  . BOWEL RESECTION   1980s?   ?colectomy/ostomy - Dr Elesa Hacker  . INCISION AND DRAINAGE ABSCESS N/A 08/14/2013   Procedure: INCISION AND DRAINAGE ABSCESS, mesh ;  Surgeon: [name], MD;  Location: [facility];  Service: General;  Laterality: N/A;  . [place]?   Dr. Lindon Romp - w mesh  . KNEE SURGERY Right   . LEFT HEART CATHETERIZATION WITH CORONARY ANGIOGRAM N/A 03/17/2014   Procedure: LEFT HEART CATHETERIZATION WITH CORONARY ANGIOGRAM;  Surgeon: [name], MD;  Location: [facility] CATH [facility];  Service: Cardiovascular;  Laterality: N/A;  . NASAL SEPTUM SURGERY    . OSTOMY TAKE DOWN  1980s?   Dr Elesa Hacker  . TONSILLECTOMY AND ADENOIDECTOMY      Current Outpatient Medications  Medication Sig Dispense Refill  . Accu-Chek Softclix Lancets lancets Use to test blood sugars 1-2 times daily. 100 each 12  . Blood Glucose Monitoring Suppl (ACCU-CHEK AVIVA) device Use to test blood sugars 1-2 times daily. 1 each 0  . carvedilol (COREG) 25 MG tablet Take 25 mg by mouth 2 (two) times daily.  3  . colchicine 0.6 MG tablet Take 1 tablet (0.6 mg total) by mouth daily. Pt states he is taking 1/2 tablet daily ( since 03/11/2019) 90 tablet 2  . cyclobenzaprine (FLEXERIL) 5 MG tablet Take 5 mg by mouth 2 (two) times a day.    . furosemide (LASIX) 80 MG tablet Take 80 mg by mouth daily.    [name] Kitchen gabapentin (NEURONTIN) 300 MG capsule Take 300 mg by mouth 3 (three) times daily.     [name] Kitchen glucose blood (ACCU-CHEK AVIVA PLUS) test strip Use to test blood sugars 1-2 times daily. 100 each 12  . insulin NPH Human (NOVOLIN N) 100 UNIT/ML injection Inject 10-20 Units into the skin 2 (two) times daily before a meal.    . insulin regular (NOVOLIN R) 100 units/mL injection Use with sliding scale. 10 mL 11  . Magnesium Oxide 200 MG TABS Take 1 tablet (200 mg total) by mouth daily. 90 tablet 3  . metolazone (ZAROXOLYN) 2.5 MG tablet Take 1 tablet (2.5 mg total) by mouth daily. 90 tablet 1  . rosuvastatin (CRESTOR) 10 MG tablet Take 1  tablet (10 mg total) by mouth daily. 90 tablet 1  . Skin Protectants, Misc. (EUCERIN) cream Apply topically as needed for wound care. 397 g 5  . spironolactone (ALDACTONE) 25 MG tablet Take 1 tablet (25 mg total) by mouth daily. 90 tablet 3  . traZODone (DESYREL) 50 MG tablet Take 2 tablets (100 mg total) by mouth at bedtime as needed for sleep. 90 tablet 1  . warfarin (COUMADIN) 10 MG tablet Take 10 mg by mouth. Daily on Monday, Wednesday and Friday.     No current facility-administered medications for this visit.     Allergies:   Patient has no known allergies.   Social History:  The patient  reports that he quit smoking about 6 years  ago. His smoking use included cigarettes. He has a 40.00 pack-year smoking history. He has never used smokeless tobacco. He reports previous alcohol use. He reports that he does not use drugs.   Family History:  The patient's family history includes Alcohol abuse in his mother; Alcoholism in his father; Diabetes in his mother; Heart Problems in his maternal grandmother.   ROS:  Please see the history of present illness.   All other systems are personally reviewed and negative.    Exam:    Vital Signs:  There were no vitals taken for this visit.  Well sounding, alert and conversant   Labs/Other Tests and Data Reviewed:    Recent Labs: 02/03/2019: B Natriuretic Peptide 194.2; TSH 1.287 02/11/2019: ALT 10 02/12/2019: Hemoglobin 12.9; Platelets 194 03/08/2019: Magnesium 1.6 03/18/2019: BUN 55; Creatinine, Ser 2.32; Potassium 4.7; Sodium 139   Wt Readings from Last 3 Encounters:  03/16/19 280 lb (127 kg)  03/08/19 280 lb (127 kg)  02/12/19 289 lb 8 oz (131.3 kg)      ASSESSMENT & PLAN:    1.  Complete heart block Approaching ERI Does not do remotes I will arrange in office device interrogation.  Once ERI, ok to proceed with generator change without an additional visit.    Risks, benefits, and alternatives to ICD pulse generator replacement were  discussed in detail today.  The patient understands that risks include but are not limited to bleeding, infection, pneumothorax, perforation, tamponade, vascular damage, renal failure, MI, stroke, death,  damage to his existing leads, and lead dislodgement and wishes to proceed once ERI.     Hold coumadin 48 hours prior to generator change  2. Nonischemic CM EF 35-40% S/p CRT Not a candidate for ICD Could consider entresto  3. Obesity Lifestyle modification is encouraged  4. Permanent afib On coumadin Rate controlled  Follow-up:  Device clinic in the next 1-2 weeks   Patient Risk:  after full review of this patients clinical status, I feel that they are at highrisk at this time.  Today, I have spent 20 minutes with the patient with telehealth technology discussing arrhythmia management and pacemaker approaching ERI .    SignedThompson Grayer, MD  06/16/2019 2:20 PM     Kurt Cross [id number] [phone] (office) [phone] (fax)

## 2019-06-17 ENCOUNTER — Ambulatory Visit (INDEPENDENT_AMBULATORY_CARE_PROVIDER_SITE_OTHER): Payer: Medicare HMO | Admitting: Student

## 2019-06-17 ENCOUNTER — Ambulatory Visit (INDEPENDENT_AMBULATORY_CARE_PROVIDER_SITE_OTHER): Payer: Medicare HMO | Admitting: *Deleted

## 2019-06-17 ENCOUNTER — Other Ambulatory Visit: Payer: Self-pay

## 2019-06-17 DIAGNOSIS — I4821 Permanent atrial fibrillation: Secondary | ICD-10-CM

## 2019-06-17 DIAGNOSIS — Z5181 Encounter for therapeutic drug level monitoring: Secondary | ICD-10-CM

## 2019-06-17 DIAGNOSIS — E1122 Type 2 diabetes mellitus with diabetic chronic kidney disease: Secondary | ICD-10-CM | POA: Diagnosis not present

## 2019-06-17 DIAGNOSIS — I4891 Unspecified atrial fibrillation: Secondary | ICD-10-CM | POA: Diagnosis not present

## 2019-06-17 DIAGNOSIS — I5042 Chronic combined systolic (congestive) and diastolic (congestive) heart failure: Secondary | ICD-10-CM | POA: Diagnosis not present

## 2019-06-17 DIAGNOSIS — E1140 Type 2 diabetes mellitus with diabetic neuropathy, unspecified: Secondary | ICD-10-CM | POA: Diagnosis not present

## 2019-06-17 DIAGNOSIS — J9611 Chronic respiratory failure with hypoxia: Secondary | ICD-10-CM | POA: Diagnosis not present

## 2019-06-17 DIAGNOSIS — I130 Hypertensive heart and chronic kidney disease with heart failure and stage 1 through stage 4 chronic kidney disease, or unspecified chronic kidney disease: Secondary | ICD-10-CM | POA: Diagnosis not present

## 2019-06-17 DIAGNOSIS — I442 Atrioventricular block, complete: Secondary | ICD-10-CM

## 2019-06-17 DIAGNOSIS — R6889 Other general symptoms and signs: Secondary | ICD-10-CM | POA: Diagnosis not present

## 2019-06-17 DIAGNOSIS — M109 Gout, unspecified: Secondary | ICD-10-CM | POA: Diagnosis not present

## 2019-06-17 DIAGNOSIS — I4892 Unspecified atrial flutter: Secondary | ICD-10-CM | POA: Diagnosis not present

## 2019-06-17 DIAGNOSIS — N184 Chronic kidney disease, stage 4 (severe): Secondary | ICD-10-CM | POA: Diagnosis not present

## 2019-06-17 LAB — CUP PACEART INCLINIC DEVICE CHECK
Battery Remaining Longevity: 2 mo
Battery Voltage: 2.79 V
Brady Statistic AP VP Percent: 0 %
Brady Statistic AP VS Percent: 0 %
Brady Statistic AS VP Percent: 95.65 %
Brady Statistic AS VS Percent: 4.35 %
Brady Statistic RA Percent Paced: 0 %
Brady Statistic RV Percent Paced: 95.74 %
Date Time Interrogation Session: 20200910153945
Implantable Lead Implant Date: 20080911
Implantable Lead Implant Date: 20080911
Implantable Lead Implant Date: 20080911
Implantable Lead Location: 753858
Implantable Lead Location: 753859
Implantable Lead Location: 753860
Implantable Lead Model: 4194
Implantable Lead Model: 5076
Implantable Lead Model: 5076
Implantable Pulse Generator Implant Date: 20150724
Lead Channel Impedance Value: 171 Ohm
Lead Channel Impedance Value: 323 Ohm
Lead Channel Impedance Value: 418 Ohm
Lead Channel Impedance Value: 437 Ohm
Lead Channel Impedance Value: 494 Ohm
Lead Channel Impedance Value: 494 Ohm
Lead Channel Impedance Value: 494 Ohm
Lead Channel Impedance Value: 551 Ohm
Lead Channel Impedance Value: 608 Ohm
Lead Channel Pacing Threshold Amplitude: 0.875 V
Lead Channel Pacing Threshold Pulse Width: 0.4 ms
Lead Channel Sensing Intrinsic Amplitude: 3.5 mV
Lead Channel Sensing Intrinsic Amplitude: 5.375 mV
Lead Channel Sensing Intrinsic Amplitude: 7.25 mV
Lead Channel Sensing Intrinsic Amplitude: 8 mV
Lead Channel Setting Pacing Amplitude: 2 V
Lead Channel Setting Pacing Amplitude: 6 V
Lead Channel Setting Pacing Pulse Width: 0.4 ms
Lead Channel Setting Pacing Pulse Width: 1.5 ms
Lead Channel Setting Sensing Sensitivity: 0.9 mV

## 2019-06-17 LAB — POCT INR: INR: 1.6 — AB (ref 2.0–3.0)

## 2019-06-17 MED ORDER — WARFARIN SODIUM 10 MG PO TABS: ORAL_TABLET | ORAL | 0 refills | Status: DC

## 2019-06-17 NOTE — Patient Instructions (Signed)
Description   Start taking 1 tablet daily, like it was ordered. Recheck INR in 1 week in the office. Coumadin Clinic [phone]

## 2019-06-17 NOTE — Addendum Note (Signed)
Addended by: Johny Shock B on: 06/17/2019 01:43 PM   Modules accepted: Orders

## 2019-06-17 NOTE — Progress Notes (Signed)
CRT-P device check in clinic. Normal device function. Sensing and impedance consistent with previous measurements. Histograms appropriate for patient and level of activity. AF 74.2%. No ventricular high rate episodes. Patient bi-ventricularly pacing 95.71% of the time. LV threshold with intermittent capture at 5.0 V @ 1.5 ms. Programmed to 6V @ 1.5 ms. Nearing ERI. Device heart failure diagnostics are within normal limits and stable over time. Estimated longevity 2 months. Pt does not perform remotes. Return to clinic in 1 month for battery check and likely gen change scheduling.

## 2019-06-18 DIAGNOSIS — J9611 Chronic respiratory failure with hypoxia: Secondary | ICD-10-CM | POA: Diagnosis not present

## 2019-06-18 DIAGNOSIS — I4891 Unspecified atrial fibrillation: Secondary | ICD-10-CM | POA: Diagnosis not present

## 2019-06-18 DIAGNOSIS — I4892 Unspecified atrial flutter: Secondary | ICD-10-CM | POA: Diagnosis not present

## 2019-06-18 DIAGNOSIS — N184 Chronic kidney disease, stage 4 (severe): Secondary | ICD-10-CM | POA: Diagnosis not present

## 2019-06-18 DIAGNOSIS — M109 Gout, unspecified: Secondary | ICD-10-CM | POA: Diagnosis not present

## 2019-06-18 DIAGNOSIS — I130 Hypertensive heart and chronic kidney disease with heart failure and stage 1 through stage 4 chronic kidney disease, or unspecified chronic kidney disease: Secondary | ICD-10-CM | POA: Diagnosis not present

## 2019-06-18 DIAGNOSIS — E1140 Type 2 diabetes mellitus with diabetic neuropathy, unspecified: Secondary | ICD-10-CM | POA: Diagnosis not present

## 2019-06-18 DIAGNOSIS — E1122 Type 2 diabetes mellitus with diabetic chronic kidney disease: Secondary | ICD-10-CM | POA: Diagnosis not present

## 2019-06-18 DIAGNOSIS — I5042 Chronic combined systolic (congestive) and diastolic (congestive) heart failure: Secondary | ICD-10-CM | POA: Diagnosis not present

## 2019-06-20 DIAGNOSIS — I5020 Unspecified systolic (congestive) heart failure: Secondary | ICD-10-CM | POA: Diagnosis not present

## 2019-06-20 DIAGNOSIS — N184 Chronic kidney disease, stage 4 (severe): Secondary | ICD-10-CM | POA: Diagnosis not present

## 2019-06-20 DIAGNOSIS — E11621 Type 2 diabetes mellitus with foot ulcer: Secondary | ICD-10-CM | POA: Diagnosis not present

## 2019-06-21 ENCOUNTER — Telehealth: Payer: Self-pay | Admitting: Family Medicine

## 2019-06-21 DIAGNOSIS — I5042 Chronic combined systolic (congestive) and diastolic (congestive) heart failure: Secondary | ICD-10-CM | POA: Diagnosis not present

## 2019-06-21 DIAGNOSIS — I4892 Unspecified atrial flutter: Secondary | ICD-10-CM | POA: Diagnosis not present

## 2019-06-21 DIAGNOSIS — J9611 Chronic respiratory failure with hypoxia: Secondary | ICD-10-CM | POA: Diagnosis not present

## 2019-06-21 DIAGNOSIS — M109 Gout, unspecified: Secondary | ICD-10-CM | POA: Diagnosis not present

## 2019-06-21 DIAGNOSIS — I4891 Unspecified atrial fibrillation: Secondary | ICD-10-CM | POA: Diagnosis not present

## 2019-06-21 DIAGNOSIS — E1140 Type 2 diabetes mellitus with diabetic neuropathy, unspecified: Secondary | ICD-10-CM | POA: Diagnosis not present

## 2019-06-21 DIAGNOSIS — N184 Chronic kidney disease, stage 4 (severe): Secondary | ICD-10-CM | POA: Diagnosis not present

## 2019-06-21 DIAGNOSIS — E1122 Type 2 diabetes mellitus with diabetic chronic kidney disease: Secondary | ICD-10-CM | POA: Diagnosis not present

## 2019-06-21 DIAGNOSIS — I130 Hypertensive heart and chronic kidney disease with heart failure and stage 1 through stage 4 chronic kidney disease, or unspecified chronic kidney disease: Secondary | ICD-10-CM | POA: Diagnosis not present

## 2019-06-21 NOTE — Telephone Encounter (Signed)
Kurt Cross [facility] is requesting mobil  xray order of right wrist due to 10 out of 10 pain and right wrist is swollen. Pt felt like arm rest moved on his wheel chair that is what cause pain . Pt does have a new wheel chair on order

## 2019-06-21 NOTE — Telephone Encounter (Signed)
Sounds more like a strain injury than a fracture. Please treat with ice and elevation. Follow up in a week.

## 2019-06-22 ENCOUNTER — Other Ambulatory Visit: Payer: Self-pay

## 2019-06-22 ENCOUNTER — Other Ambulatory Visit: Payer: Self-pay | Admitting: Family Medicine

## 2019-06-22 MED ORDER — GABAPENTIN 300 MG PO CAPS: 300.0000 mg | ORAL_CAPSULE | Freq: Three times a day (TID) | ORAL | 1 refills | Status: DC

## 2019-06-22 NOTE — Patient Outreach (Signed)
[place] [facility]) Care Management  06/22/2019  Kurt Cross 10-21-1950 [id number]  Telephone assessment follow up:  Telephone call to patient. HIPAA verified.  Patient states he hurt his right wrist.  He states he has notified his doctor and they are suppose to come out and do an xray today.  He states he has not heard from they yet regarding the time they will come.  Patient denies falling. He states he was trying to get in the bed from his wheelchair and the arm of the wheelchair moved which caused him to hurt his wrist. He states he is not able to do anything at this point.   Patient states he is not able to get the new wheelchair and bed at this point because he has a copay. Patient states, " I'll try to get it next week maybe." Patient reports his wrist pain is a 10 on a scale of 1 to 10 with 10 being the worst.  Patient states he is not feeling very good right now.  RNCM offered to follow up with patient at a later time. Patient verbally agreed.   PLAN; RNCM will follow up with patient within 1 week.   [name] RN,BSN,CCM [facility] Telephonic  [phone]

## 2019-06-22 NOTE — Telephone Encounter (Signed)
I left Kurt Cross a voicemail with all the information below & advised her to call us back with any questions.

## 2019-06-22 NOTE — Telephone Encounter (Signed)
Requested medication (s) are due for refill today: yes  Requested medication (s) are on the active medication list: yes   Future visit scheduled: no  Notes to clinic: review for refill   Requested Prescriptions  Pending Prescriptions Disp Refills   gabapentin (NEURONTIN) 300 MG capsule       Sig: Take 1 capsule (300 mg total) by mouth 3 (three) times daily.     Neurology: Anticonvulsants - gabapentin Passed - 06/22/2019  1:14 PM      Passed - Valid encounter within last 12 months    Recent Outpatient Visits          3 months ago Acute on chronic congestive heart failure, unspecified heart failure type [facility])   [facility] [place], Mortimer Fries, MD   6 months ago Diabetes mellitus, insulin dependent (IDDM), uncontrolled ([place])   [facility] [place], Mortimer Fries, MD   10 months ago Chronic combined systolic and diastolic CHF (congestive heart failure) [facility])   [facility] [place], Mortimer Fries, MD   10 months ago Chronic combined systolic and diastolic CHF (congestive heart failure) [facility])   [facility] [place], Mortimer Fries, MD

## 2019-06-22 NOTE — Telephone Encounter (Signed)
Medication Refill - Medication: gabapentin (NEURONTIN) 300 MG capsule   Has the patient contacted their pharmacy? Yes.   (Agent: If no, request that the patient contact the pharmacy for the refill.) (Agent: If yes, when and what did the pharmacy advise?)  Preferred Pharmacy (with phone number or street name):  [facility], [place]  [place] [address]  Phone: [phone] Fax: [phone]     Agent: Please be advised that RX refills may take up to 3 business days. We ask that you follow-up with your pharmacy.

## 2019-06-24 ENCOUNTER — Other Ambulatory Visit: Payer: Self-pay

## 2019-06-24 ENCOUNTER — Ambulatory Visit (INDEPENDENT_AMBULATORY_CARE_PROVIDER_SITE_OTHER): Payer: Medicare HMO | Admitting: Pharmacist

## 2019-06-24 DIAGNOSIS — I4821 Permanent atrial fibrillation: Secondary | ICD-10-CM | POA: Diagnosis not present

## 2019-06-24 DIAGNOSIS — Z5181 Encounter for therapeutic drug level monitoring: Secondary | ICD-10-CM | POA: Diagnosis not present

## 2019-06-24 DIAGNOSIS — R6889 Other general symptoms and signs: Secondary | ICD-10-CM | POA: Diagnosis not present

## 2019-06-24 LAB — POCT INR: INR: 4.4 — AB (ref 2.0–3.0)

## 2019-06-24 NOTE — Patient Instructions (Signed)
Skip your coumadin dose tomorrow. Then start taking 1 tablet daily except 1/2 tablet on Saturdays. Recheck in 2 weeks. Coumadin Clinic [phone]

## 2019-06-28 ENCOUNTER — Other Ambulatory Visit: Payer: Self-pay

## 2019-06-28 ENCOUNTER — Encounter: Payer: Medicare HMO | Admitting: Student

## 2019-06-28 NOTE — Patient Outreach (Signed)
Kurt Cross [name] Care Management  06/28/2019  Kurt Cross 08-04-1951 [id number]   Telephone assessment:   Telephone call to patient for follow up. HIPAA verified.  Patient states he has not heard from anyone regarding his wrist injury. Patient states he initially spoke with [facility] on 06/22/19 regarding his wrist injury. He states he was told an order would be submitted for him to have an xray done of the wrist. He states he had a follow up visit with his cardiologist on 06/24/19.  He reports mentioning his wrist injury to his cardiologist at the visit. Patient states he has not heard anything regarding having an xray. Patient states he contacted his primary MD office last week regarding medication follow up and also mentioned his wrist injury. Patient states the wrist is getting better but states it is still difficult to apply weight to it.   Patient states he has not obtained the newly ordered wheelchair and hospital bed as of yet due to having a copayment. Patient states he plans to pay copayment for wheelchair and bed.  RNCM advised patient she will follow up regarding xray order. RNCM placed call to [facility] therapist at [phone].  Therapist states she is not working with patient at this time but will contact [facility] main office to find out which therapist is working with him. Therapist states she will return call to [facility] once information obtained.    ASSESSMENT:  Upon chart review note from Dr. Ethelene Hal states patient should treat wrist injury with ice and elevation and follow up in 1 week.  RNCM attempted to call patient back to discussed physician advisement. Unable to reach. HIPAA compliant voice message left with call back phone number.  PLAN; RNCM will attempt 2nd telephone call to patient within 4 business days.   [name] RN,BSN,CCM [facility] Telephonic  [phone]

## 2019-06-28 NOTE — Patient Outreach (Signed)
[place] [facility]) Care Management  06/28/2019  Kurt Cross 1951/09/27 [id number]  Care coordination:   Voice mail message received from [name], physical therapist with [facility] stating patient has improved quite a bit. She states he can transfer independently from wheelchair to bed. She reports patient is spending more time out of bed. Therapist states patient's caregivers are doing less for him. He is able to wheel himself to the kitchen to get his own drink. Therapist states the wheelchair and hospital bed has been ordered by [name]. They are waiting on him to pay his patient copay. Patient's goal is to walk again. Therapist states patient's knees are contracted so unsure if this is a realistic goal.  Therapist states independent at wheelchair level is probably a more  reasonable goal for patient. Patient may be a candidate for a power wheelchair. Therapist states they are waiting for patients insurance to authorize the remainder of his physical therapy visits.  Telephone call returned to [name].  Therapist states patient's last home health physical therapy visit was 06/18/19.Therapist states [facility] authorization department has reached out again to patients insurance for authorization. Therapist states she contacted patient this afternoon. She states she will provide patient a brace for his wrist on tomorrow.   PLAN: RNCM will follow up with patient at next scheduled outreach.   [name] RN,BSN,CCM [name] Telephonic  [phone] '

## 2019-06-29 ENCOUNTER — Telehealth: Payer: Self-pay

## 2019-06-29 DIAGNOSIS — E1122 Type 2 diabetes mellitus with diabetic chronic kidney disease: Secondary | ICD-10-CM | POA: Diagnosis not present

## 2019-06-29 DIAGNOSIS — N184 Chronic kidney disease, stage 4 (severe): Secondary | ICD-10-CM | POA: Diagnosis not present

## 2019-06-29 DIAGNOSIS — I5042 Chronic combined systolic (congestive) and diastolic (congestive) heart failure: Secondary | ICD-10-CM | POA: Diagnosis not present

## 2019-06-29 DIAGNOSIS — I4891 Unspecified atrial fibrillation: Secondary | ICD-10-CM | POA: Diagnosis not present

## 2019-06-29 DIAGNOSIS — E1140 Type 2 diabetes mellitus with diabetic neuropathy, unspecified: Secondary | ICD-10-CM | POA: Diagnosis not present

## 2019-06-29 DIAGNOSIS — M109 Gout, unspecified: Secondary | ICD-10-CM | POA: Diagnosis not present

## 2019-06-29 DIAGNOSIS — I4892 Unspecified atrial flutter: Secondary | ICD-10-CM | POA: Diagnosis not present

## 2019-06-29 DIAGNOSIS — J9611 Chronic respiratory failure with hypoxia: Secondary | ICD-10-CM | POA: Diagnosis not present

## 2019-06-29 DIAGNOSIS — I130 Hypertensive heart and chronic kidney disease with heart failure and stage 1 through stage 4 chronic kidney disease, or unspecified chronic kidney disease: Secondary | ICD-10-CM | POA: Diagnosis not present

## 2019-06-29 NOTE — Telephone Encounter (Signed)
I left Kurt Cross a voicemail letting her know it is okay to proceed with the x-[place].

## 2019-06-29 NOTE — Telephone Encounter (Signed)
Yes please xray his wrist.

## 2019-06-29 NOTE — Telephone Encounter (Signed)
Gave verbal order to [place] again. She stated out phone was breaking up when Judson Roch left her vm.

## 2019-06-29 NOTE — Telephone Encounter (Signed)
Copied from [place] [id number]. Topic: General - Other >> Jun 29, 2019  2:39 PM Celene Kras A wrote: Reason for CRM: Elmyra Ricks, from [facility], called stating the pt is still experiencing 8/10 pain in his right wrist. She states they have an x Romas machine and they could preform the x Kassem if the orders were placed. Please advise.   [phone]

## 2019-07-01 DIAGNOSIS — I5042 Chronic combined systolic (congestive) and diastolic (congestive) heart failure: Secondary | ICD-10-CM | POA: Diagnosis not present

## 2019-07-01 DIAGNOSIS — I4892 Unspecified atrial flutter: Secondary | ICD-10-CM | POA: Diagnosis not present

## 2019-07-01 DIAGNOSIS — J9611 Chronic respiratory failure with hypoxia: Secondary | ICD-10-CM | POA: Diagnosis not present

## 2019-07-01 DIAGNOSIS — I4891 Unspecified atrial fibrillation: Secondary | ICD-10-CM | POA: Diagnosis not present

## 2019-07-01 DIAGNOSIS — N184 Chronic kidney disease, stage 4 (severe): Secondary | ICD-10-CM | POA: Diagnosis not present

## 2019-07-01 DIAGNOSIS — I130 Hypertensive heart and chronic kidney disease with heart failure and stage 1 through stage 4 chronic kidney disease, or unspecified chronic kidney disease: Secondary | ICD-10-CM | POA: Diagnosis not present

## 2019-07-01 DIAGNOSIS — E1140 Type 2 diabetes mellitus with diabetic neuropathy, unspecified: Secondary | ICD-10-CM | POA: Diagnosis not present

## 2019-07-01 DIAGNOSIS — M109 Gout, unspecified: Secondary | ICD-10-CM | POA: Diagnosis not present

## 2019-07-01 DIAGNOSIS — E1122 Type 2 diabetes mellitus with diabetic chronic kidney disease: Secondary | ICD-10-CM | POA: Diagnosis not present

## 2019-07-02 ENCOUNTER — Other Ambulatory Visit: Payer: Self-pay

## 2019-07-02 NOTE — Patient Outreach (Signed)
[place] [facility]) Care Management  07/02/2019  Kurt Cross [birth year] [id number]   Telephone assessment.  Telephone call to patient for follow up. Unable to reach patient. HIPAA compliant voice message left.   PLAN; RNCM will attempt telephone call to patient within 4 business days.  RNCM will send outreach letter to attempt contact.   [name] RN,BSN,CCM [facility] Telephonic  [phone]

## 2019-07-04 DIAGNOSIS — M25531 Pain in right wrist: Secondary | ICD-10-CM | POA: Diagnosis not present

## 2019-07-06 DIAGNOSIS — E1140 Type 2 diabetes mellitus with diabetic neuropathy, unspecified: Secondary | ICD-10-CM | POA: Diagnosis not present

## 2019-07-06 DIAGNOSIS — I130 Hypertensive heart and chronic kidney disease with heart failure and stage 1 through stage 4 chronic kidney disease, or unspecified chronic kidney disease: Secondary | ICD-10-CM | POA: Diagnosis not present

## 2019-07-06 DIAGNOSIS — I4892 Unspecified atrial flutter: Secondary | ICD-10-CM | POA: Diagnosis not present

## 2019-07-06 DIAGNOSIS — J9611 Chronic respiratory failure with hypoxia: Secondary | ICD-10-CM | POA: Diagnosis not present

## 2019-07-06 DIAGNOSIS — E1122 Type 2 diabetes mellitus with diabetic chronic kidney disease: Secondary | ICD-10-CM | POA: Diagnosis not present

## 2019-07-06 DIAGNOSIS — M109 Gout, unspecified: Secondary | ICD-10-CM | POA: Diagnosis not present

## 2019-07-06 DIAGNOSIS — I5042 Chronic combined systolic (congestive) and diastolic (congestive) heart failure: Secondary | ICD-10-CM | POA: Diagnosis not present

## 2019-07-06 DIAGNOSIS — I4891 Unspecified atrial fibrillation: Secondary | ICD-10-CM | POA: Diagnosis not present

## 2019-07-06 DIAGNOSIS — N184 Chronic kidney disease, stage 4 (severe): Secondary | ICD-10-CM | POA: Diagnosis not present

## 2019-07-07 ENCOUNTER — Other Ambulatory Visit: Payer: Self-pay | Admitting: Family Medicine

## 2019-07-07 ENCOUNTER — Other Ambulatory Visit: Payer: Self-pay

## 2019-07-07 DIAGNOSIS — N184 Chronic kidney disease, stage 4 (severe): Secondary | ICD-10-CM | POA: Diagnosis not present

## 2019-07-07 DIAGNOSIS — I4892 Unspecified atrial flutter: Secondary | ICD-10-CM | POA: Diagnosis not present

## 2019-07-07 DIAGNOSIS — M109 Gout, unspecified: Secondary | ICD-10-CM | POA: Diagnosis not present

## 2019-07-07 DIAGNOSIS — I10 Essential (primary) hypertension: Secondary | ICD-10-CM

## 2019-07-07 DIAGNOSIS — E1122 Type 2 diabetes mellitus with diabetic chronic kidney disease: Secondary | ICD-10-CM | POA: Diagnosis not present

## 2019-07-07 DIAGNOSIS — I130 Hypertensive heart and chronic kidney disease with heart failure and stage 1 through stage 4 chronic kidney disease, or unspecified chronic kidney disease: Secondary | ICD-10-CM | POA: Diagnosis not present

## 2019-07-07 DIAGNOSIS — J9611 Chronic respiratory failure with hypoxia: Secondary | ICD-10-CM | POA: Diagnosis not present

## 2019-07-07 DIAGNOSIS — E1140 Type 2 diabetes mellitus with diabetic neuropathy, unspecified: Secondary | ICD-10-CM | POA: Diagnosis not present

## 2019-07-07 DIAGNOSIS — I5042 Chronic combined systolic (congestive) and diastolic (congestive) heart failure: Secondary | ICD-10-CM | POA: Diagnosis not present

## 2019-07-07 DIAGNOSIS — I4891 Unspecified atrial fibrillation: Secondary | ICD-10-CM | POA: Diagnosis not present

## 2019-07-07 NOTE — Patient Outreach (Signed)
[facility]) Care Management  07/07/2019  ANDRE VALLEY 01-09-1951 [id number]  Telephone assessment  Outreach attempt #2  Telephone call to patient for follow up. Unable to reach. HIPAA compliant voice message left with call back phone number.    PLAN; RNCM will attempt 3rd telephone call to patient within 4 business days. RNCM will send patient outreach letter to attempt contact.    [name] RN,BSN,CCM [facility] Telephonic  [phone]

## 2019-07-07 NOTE — Telephone Encounter (Signed)
Medication Refill: furosemide (LASIX) 80 MG tablet [id number]   Pharmacy:  [facility] [place], [place] [phone] (Phone) [phone] (Fax)

## 2019-07-07 NOTE — Telephone Encounter (Signed)
Requested medication (s) are due for refill today: yes  Requested medication (s) are on the active medication list: yes  Last refill:  03/12/2019  Future visit scheduled: yes  Notes to clinic:  Review for refill   Requested Prescriptions  Pending Prescriptions Disp Refills   furosemide (LASIX) 80 MG tablet       Sig: Take 1 tablet (80 mg total) by mouth daily.     Cardiovascular:  Diuretics - Loop Failed - 07/07/2019  1:31 PM      Failed - Cr in normal range and within 360 days    Creat  Date Value Ref Range Status  03/12/2019 2.44 (H) 0.70 - 1.25 mg/dL Final    Comment:    For patients >17 years of age, the reference limit for Creatinine is approximately 13% higher for people identified as African-American. .    Creatinine, Ser  Date Value Ref Range Status  03/18/2019 2.32 (H) 0.76 - 1.27 mg/dL Final         Passed - K in normal range and within 360 days    Potassium  Date Value Ref Range Status  03/18/2019 4.7 3.5 - 5.2 mmol/L Final         Passed - Ca in normal range and within 360 days    Calcium  Date Value Ref Range Status  03/18/2019 8.8 8.6 - 10.2 mg/dL Final         Passed - Na in normal range and within 360 days    Sodium  Date Value Ref Range Status  03/18/2019 139 134 - 144 mmol/L Final         Passed - Last BP in normal range    BP Readings from Last 1 Encounters:  03/12/19 128/80         Passed - Valid encounter within last 6 months    Recent Outpatient Visits          3 months ago Acute on chronic congestive heart failure, unspecified heart failure type [facility])   [facility] [place] [facility], Mortimer Fries, MD   7 months ago Diabetes mellitus, insulin dependent (IDDM), uncontrolled ([place])   [facility] [place], Mortimer Fries, MD   10 months ago Chronic combined systolic and diastolic CHF (congestive heart failure) [facility])   [facility] [place] [facility], Mortimer Fries, MD   10 months ago Chronic  combined systolic and diastolic CHF (congestive heart failure) [facility])   [facility], Mortimer Fries, MD

## 2019-07-08 ENCOUNTER — Other Ambulatory Visit: Payer: Self-pay

## 2019-07-08 ENCOUNTER — Telehealth: Payer: Self-pay | Admitting: Cardiology

## 2019-07-08 ENCOUNTER — Telehealth: Payer: Self-pay

## 2019-07-08 ENCOUNTER — Other Ambulatory Visit: Payer: Self-pay | Admitting: Internal Medicine

## 2019-07-08 ENCOUNTER — Ambulatory Visit (INDEPENDENT_AMBULATORY_CARE_PROVIDER_SITE_OTHER): Payer: Medicare HMO | Admitting: *Deleted

## 2019-07-08 ENCOUNTER — Other Ambulatory Visit (HOSPITAL_COMMUNITY)
Admission: RE | Admit: 2019-07-08 | Discharge: 2019-07-08 | Disposition: A | Discharge status: Home / Self Care | Payer: Medicare HMO | Source: Ambulatory Visit | Attending: Internal Medicine | Admitting: Internal Medicine

## 2019-07-08 DIAGNOSIS — J42 Unspecified chronic bronchitis: Secondary | ICD-10-CM | POA: Diagnosis present

## 2019-07-08 DIAGNOSIS — Z5181 Encounter for therapeutic drug level monitoring: Secondary | ICD-10-CM | POA: Diagnosis not present

## 2019-07-08 DIAGNOSIS — E1121 Type 2 diabetes mellitus with diabetic nephropathy: Secondary | ICD-10-CM | POA: Diagnosis not present

## 2019-07-08 DIAGNOSIS — Z794 Long term (current) use of insulin: Secondary | ICD-10-CM

## 2019-07-08 DIAGNOSIS — Z7401 Bed confinement status: Secondary | ICD-10-CM | POA: Diagnosis not present

## 2019-07-08 DIAGNOSIS — T45511A Poisoning by anticoagulants, accidental (unintentional), initial encounter: Secondary | ICD-10-CM | POA: Diagnosis not present

## 2019-07-08 DIAGNOSIS — E1165 Type 2 diabetes mellitus with hyperglycemia: Secondary | ICD-10-CM | POA: Diagnosis not present

## 2019-07-08 DIAGNOSIS — R6889 Other general symptoms and signs: Secondary | ICD-10-CM | POA: Diagnosis not present

## 2019-07-08 DIAGNOSIS — J9611 Chronic respiratory failure with hypoxia: Secondary | ICD-10-CM | POA: Diagnosis not present

## 2019-07-08 DIAGNOSIS — E785 Hyperlipidemia, unspecified: Secondary | ICD-10-CM | POA: Diagnosis present

## 2019-07-08 DIAGNOSIS — E1140 Type 2 diabetes mellitus with diabetic neuropathy, unspecified: Secondary | ICD-10-CM | POA: Diagnosis not present

## 2019-07-08 DIAGNOSIS — I4891 Unspecified atrial fibrillation: Secondary | ICD-10-CM | POA: Diagnosis not present

## 2019-07-08 DIAGNOSIS — I4821 Permanent atrial fibrillation: Secondary | ICD-10-CM | POA: Insufficient documentation

## 2019-07-08 DIAGNOSIS — I5042 Chronic combined systolic (congestive) and diastolic (congestive) heart failure: Secondary | ICD-10-CM | POA: Diagnosis not present

## 2019-07-08 DIAGNOSIS — K912 Postsurgical malabsorption, not elsewhere classified: Secondary | ICD-10-CM | POA: Diagnosis present

## 2019-07-08 DIAGNOSIS — R9082 White matter disease, unspecified: Secondary | ICD-10-CM | POA: Diagnosis not present

## 2019-07-08 DIAGNOSIS — E1122 Type 2 diabetes mellitus with diabetic chronic kidney disease: Secondary | ICD-10-CM | POA: Insufficient documentation

## 2019-07-08 DIAGNOSIS — N1832 Chronic kidney disease, stage 3b: Secondary | ICD-10-CM | POA: Diagnosis not present

## 2019-07-08 DIAGNOSIS — I4892 Unspecified atrial flutter: Secondary | ICD-10-CM | POA: Diagnosis not present

## 2019-07-08 DIAGNOSIS — N184 Chronic kidney disease, stage 4 (severe): Secondary | ICD-10-CM | POA: Diagnosis not present

## 2019-07-08 DIAGNOSIS — M109 Gout, unspecified: Secondary | ICD-10-CM | POA: Diagnosis not present

## 2019-07-08 DIAGNOSIS — R531 Weakness: Secondary | ICD-10-CM | POA: Diagnosis not present

## 2019-07-08 DIAGNOSIS — I130 Hypertensive heart and chronic kidney disease with heart failure and stage 1 through stage 4 chronic kidney disease, or unspecified chronic kidney disease: Secondary | ICD-10-CM | POA: Diagnosis not present

## 2019-07-08 DIAGNOSIS — K921 Melena: Secondary | ICD-10-CM | POA: Diagnosis not present

## 2019-07-08 DIAGNOSIS — I10 Essential (primary) hypertension: Secondary | ICD-10-CM | POA: Diagnosis not present

## 2019-07-08 DIAGNOSIS — Z950 Presence of cardiac pacemaker: Secondary | ICD-10-CM | POA: Diagnosis not present

## 2019-07-08 DIAGNOSIS — Z66 Do not resuscitate: Secondary | ICD-10-CM | POA: Diagnosis not present

## 2019-07-08 DIAGNOSIS — I714 Abdominal aortic aneurysm, without rupture: Secondary | ICD-10-CM | POA: Diagnosis present

## 2019-07-08 DIAGNOSIS — Z20828 Contact with and (suspected) exposure to other viral communicable diseases: Secondary | ICD-10-CM | POA: Diagnosis not present

## 2019-07-08 DIAGNOSIS — Z833 Family history of diabetes mellitus: Secondary | ICD-10-CM | POA: Diagnosis not present

## 2019-07-08 DIAGNOSIS — M2550 Pain in unspecified joint: Secondary | ICD-10-CM | POA: Diagnosis not present

## 2019-07-08 DIAGNOSIS — I482 Chronic atrial fibrillation, unspecified: Secondary | ICD-10-CM | POA: Diagnosis not present

## 2019-07-08 DIAGNOSIS — Z811 Family history of alcohol abuse and dependence: Secondary | ICD-10-CM | POA: Diagnosis not present

## 2019-07-08 DIAGNOSIS — I442 Atrioventricular block, complete: Secondary | ICD-10-CM | POA: Diagnosis not present

## 2019-07-08 DIAGNOSIS — G4733 Obstructive sleep apnea (adult) (pediatric): Secondary | ICD-10-CM | POA: Diagnosis present

## 2019-07-08 DIAGNOSIS — E876 Hypokalemia: Secondary | ICD-10-CM | POA: Diagnosis not present

## 2019-07-08 DIAGNOSIS — R791 Abnormal coagulation profile: Secondary | ICD-10-CM | POA: Diagnosis not present

## 2019-07-08 DIAGNOSIS — Z7901 Long term (current) use of anticoagulants: Secondary | ICD-10-CM | POA: Diagnosis not present

## 2019-07-08 LAB — PROTIME-INR
INR: >10 (ref 0.8–1.2)
INR: >10 (ref 0.8–1.2)
Prothrombin Time: >120 s — ABNORMAL HIGH (ref 9.1–12.0)
Prothrombin Time: 80.2 seconds — ABNORMAL HIGH (ref 11.4–15.2)

## 2019-07-08 LAB — POCT INR: INR: 8 — AB (ref 2.0–3.0)

## 2019-07-08 MED ORDER — FUROSEMIDE 80 MG PO TABS: 80.0000 mg | ORAL_TABLET | Freq: Every day | ORAL | 1 refills | Status: DC

## 2019-07-08 NOTE — Telephone Encounter (Signed)
Do you know anything about this?  Copied from [place] [id number]. Topic: General - Inquiry >> Jul 08, 2019  3:53 PM Richardo Priest, NT wrote: Reason for CRM: Patient called in stating he would like office to call his wife and explain why he has to have surgery on his hand. Please advise. Call number is [phone].

## 2019-07-08 NOTE — Telephone Encounter (Signed)
Received page from outpatient labs obtained today. INR checked in the office was 8, repeat send out 10.2. I talked with the patient's wife who reported he has had bleeding from his inner ear today. She feels he has not been himself, more lethargic. Informed her with an INR level that high and symptoms he should be evaluated in the ED for possible INR reversal. She agreed and will take him for further evaluation.

## 2019-07-08 NOTE — Telephone Encounter (Signed)
No message needed °

## 2019-07-08 NOTE — Telephone Encounter (Signed)
Unfortunately, I am not certain what he is referring to at this point. Perhaps he should have the surgeons office call his wife. I would also like to know what surgery that he will be needing.

## 2019-07-09 ENCOUNTER — Emergency Department (HOSPITAL_COMMUNITY): Payer: Medicare HMO

## 2019-07-09 ENCOUNTER — Other Ambulatory Visit: Payer: Self-pay

## 2019-07-09 ENCOUNTER — Encounter (HOSPITAL_COMMUNITY): Payer: Self-pay

## 2019-07-09 ENCOUNTER — Telehealth: Payer: Self-pay | Admitting: Pharmacist

## 2019-07-09 ENCOUNTER — Inpatient Hospital Stay (HOSPITAL_COMMUNITY)
Admission: EM | Admit: 2019-07-09 | Discharge: 2019-07-13 | DRG: 918 | Disposition: A | Discharge status: Home Health | Payer: Medicare HMO | Attending: Family Medicine | Admitting: Family Medicine

## 2019-07-09 DIAGNOSIS — Z794 Long term (current) use of insulin: Secondary | ICD-10-CM | POA: Diagnosis not present

## 2019-07-09 DIAGNOSIS — Z87891 Personal history of nicotine dependence: Secondary | ICD-10-CM

## 2019-07-09 DIAGNOSIS — J42 Unspecified chronic bronchitis: Secondary | ICD-10-CM | POA: Diagnosis present

## 2019-07-09 DIAGNOSIS — D72829 Elevated white blood cell count, unspecified: Secondary | ICD-10-CM | POA: Diagnosis not present

## 2019-07-09 DIAGNOSIS — K921 Melena: Secondary | ICD-10-CM | POA: Diagnosis not present

## 2019-07-09 DIAGNOSIS — E785 Hyperlipidemia, unspecified: Secondary | ICD-10-CM | POA: Diagnosis present

## 2019-07-09 DIAGNOSIS — I130 Hypertensive heart and chronic kidney disease with heart failure and stage 1 through stage 4 chronic kidney disease, or unspecified chronic kidney disease: Secondary | ICD-10-CM | POA: Diagnosis not present

## 2019-07-09 DIAGNOSIS — T45511A Poisoning by anticoagulants, accidental (unintentional), initial encounter: Principal | ICD-10-CM | POA: Diagnosis present

## 2019-07-09 DIAGNOSIS — E876 Hypokalemia: Secondary | ICD-10-CM | POA: Diagnosis not present

## 2019-07-09 DIAGNOSIS — N184 Chronic kidney disease, stage 4 (severe): Secondary | ICD-10-CM | POA: Diagnosis not present

## 2019-07-09 DIAGNOSIS — I442 Atrioventricular block, complete: Secondary | ICD-10-CM | POA: Diagnosis present

## 2019-07-09 DIAGNOSIS — I10 Essential (primary) hypertension: Secondary | ICD-10-CM

## 2019-07-09 DIAGNOSIS — J9611 Chronic respiratory failure with hypoxia: Secondary | ICD-10-CM | POA: Diagnosis present

## 2019-07-09 DIAGNOSIS — I714 Abdominal aortic aneurysm, without rupture: Secondary | ICD-10-CM | POA: Diagnosis present

## 2019-07-09 DIAGNOSIS — E860 Dehydration: Secondary | ICD-10-CM

## 2019-07-09 DIAGNOSIS — Z66 Do not resuscitate: Secondary | ICD-10-CM | POA: Diagnosis present

## 2019-07-09 DIAGNOSIS — R791 Abnormal coagulation profile: Secondary | ICD-10-CM

## 2019-07-09 DIAGNOSIS — E1122 Type 2 diabetes mellitus with diabetic chronic kidney disease: Secondary | ICD-10-CM | POA: Diagnosis not present

## 2019-07-09 DIAGNOSIS — K912 Postsurgical malabsorption, not elsewhere classified: Secondary | ICD-10-CM | POA: Diagnosis present

## 2019-07-09 DIAGNOSIS — I482 Chronic atrial fibrillation, unspecified: Secondary | ICD-10-CM | POA: Diagnosis present

## 2019-07-09 DIAGNOSIS — E1121 Type 2 diabetes mellitus with diabetic nephropathy: Secondary | ICD-10-CM

## 2019-07-09 DIAGNOSIS — Z7901 Long term (current) use of anticoagulants: Secondary | ICD-10-CM

## 2019-07-09 DIAGNOSIS — I4891 Unspecified atrial fibrillation: Secondary | ICD-10-CM | POA: Diagnosis not present

## 2019-07-09 DIAGNOSIS — E1140 Type 2 diabetes mellitus with diabetic neuropathy, unspecified: Secondary | ICD-10-CM | POA: Diagnosis not present

## 2019-07-09 DIAGNOSIS — N1832 Chronic kidney disease, stage 3b: Secondary | ICD-10-CM | POA: Diagnosis not present

## 2019-07-09 DIAGNOSIS — H9221 Otorrhagia, right ear: Secondary | ICD-10-CM | POA: Diagnosis present

## 2019-07-09 DIAGNOSIS — R531 Weakness: Secondary | ICD-10-CM | POA: Diagnosis not present

## 2019-07-09 DIAGNOSIS — I4892 Unspecified atrial flutter: Secondary | ICD-10-CM | POA: Diagnosis not present

## 2019-07-09 DIAGNOSIS — R9082 White matter disease, unspecified: Secondary | ICD-10-CM | POA: Diagnosis not present

## 2019-07-09 DIAGNOSIS — M109 Gout, unspecified: Secondary | ICD-10-CM | POA: Diagnosis not present

## 2019-07-09 DIAGNOSIS — Z833 Family history of diabetes mellitus: Secondary | ICD-10-CM

## 2019-07-09 DIAGNOSIS — G4733 Obstructive sleep apnea (adult) (pediatric): Secondary | ICD-10-CM | POA: Diagnosis present

## 2019-07-09 DIAGNOSIS — Z811 Family history of alcohol abuse and dependence: Secondary | ICD-10-CM

## 2019-07-09 DIAGNOSIS — E11649 Type 2 diabetes mellitus with hypoglycemia without coma: Secondary | ICD-10-CM | POA: Diagnosis not present

## 2019-07-09 DIAGNOSIS — Z20828 Contact with and (suspected) exposure to other viral communicable diseases: Secondary | ICD-10-CM | POA: Diagnosis present

## 2019-07-09 DIAGNOSIS — Z950 Presence of cardiac pacemaker: Secondary | ICD-10-CM

## 2019-07-09 DIAGNOSIS — I5042 Chronic combined systolic (congestive) and diastolic (congestive) heart failure: Secondary | ICD-10-CM | POA: Diagnosis present

## 2019-07-09 LAB — COMPREHENSIVE METABOLIC PANEL
ALT: 15 U/L (ref 0–44)
AST: 24 U/L (ref 15–41)
Albumin: 2.8 g/dL — ABNORMAL LOW (ref 3.5–5.0)
Alkaline Phosphatase: 26 U/L — ABNORMAL LOW (ref 38–126)
Anion gap: 19 — ABNORMAL HIGH (ref 5–15)
BUN: 77 mg/dL — ABNORMAL HIGH (ref 8–23)
CO2: 33 mmol/L — ABNORMAL HIGH (ref 22–32)
Calcium: 8.9 mg/dL (ref 8.9–10.3)
Chloride: 79 mmol/L — ABNORMAL LOW (ref 98–111)
Creatinine, Ser: 2.89 mg/dL — ABNORMAL HIGH (ref 0.61–1.24)
GFR calc Af Amer: 25 mL/min — ABNORMAL LOW (ref >60)
GFR calc non Af Amer: 22 mL/min — ABNORMAL LOW (ref >60)
Glucose, Bld: 334 mg/dL — ABNORMAL HIGH (ref 70–99)
Potassium: 2.3 mmol/L — CL (ref 3.5–5.1)
Sodium: 131 mmol/L — ABNORMAL LOW (ref 135–145)
Total Bilirubin: 1.3 mg/dL — ABNORMAL HIGH (ref 0.3–1.2)
Total Protein: 8.1 g/dL (ref 6.5–8.1)

## 2019-07-09 LAB — I-STAT CHEM 8, ED
BUN: 71 mg/dL — ABNORMAL HIGH (ref 8–23)
Calcium, Ion: 0.98 mmol/L — ABNORMAL LOW (ref 1.15–1.40)
Chloride: 79 mmol/L — ABNORMAL LOW (ref 98–111)
Creatinine, Ser: 2.5 mg/dL — ABNORMAL HIGH (ref 0.61–1.24)
Glucose, Bld: 328 mg/dL — ABNORMAL HIGH (ref 70–99)
HCT: 55 % — ABNORMAL HIGH (ref 39.0–52.0)
Hemoglobin: 18.7 g/dL — ABNORMAL HIGH (ref 13.0–17.0)
Potassium: 2.2 mmol/L — CL (ref 3.5–5.1)
Sodium: 133 mmol/L — ABNORMAL LOW (ref 135–145)
TCO2: 35 mmol/L — ABNORMAL HIGH (ref 22–32)

## 2019-07-09 LAB — CBC WITH DIFFERENTIAL/PLATELET
Abs Immature Granulocytes: 0.08 10*3/uL — ABNORMAL HIGH (ref 0.00–0.07)
Basophils Absolute: 0 10*3/uL (ref 0.0–0.1)
Basophils Relative: 0 %
Eosinophils Absolute: 0 10*3/uL (ref 0.0–0.5)
Eosinophils Relative: 0 %
HCT: 49.4 % (ref 39.0–52.0)
Hemoglobin: 17.1 g/dL — ABNORMAL HIGH (ref 13.0–17.0)
Immature Granulocytes: 1 %
Lymphocytes Relative: 5 %
Lymphs Abs: 0.7 10*3/uL (ref 0.7–4.0)
MCH: 32.4 pg (ref 26.0–34.0)
MCHC: 34.6 g/dL (ref 30.0–36.0)
MCV: 93.6 fL (ref 80.0–100.0)
Monocytes Absolute: 0.8 10*3/uL (ref 0.1–1.0)
Monocytes Relative: 5 %
Neutro Abs: 12.9 10*3/uL — ABNORMAL HIGH (ref 1.7–7.7)
Neutrophils Relative %: 89 %
Platelets: 274 10*3/uL (ref 150–400)
RBC: 5.28 MIL/uL (ref 4.22–5.81)
RDW: 13.6 % (ref 11.5–15.5)
WBC: 14.5 10*3/uL — ABNORMAL HIGH (ref 4.0–10.5)
nRBC: 0 % (ref 0.0–0.2)

## 2019-07-09 LAB — SARS CORONAVIRUS 2 BY RT PCR (HOSPITAL ORDER, PERFORMED IN ~~LOC~~ HOSPITAL LAB): SARS Coronavirus 2: NEGATIVE

## 2019-07-09 LAB — CBG MONITORING, ED
Glucose-Capillary: 243 mg/dL — ABNORMAL HIGH (ref 70–99)
Glucose-Capillary: 362 mg/dL — ABNORMAL HIGH (ref 70–99)

## 2019-07-09 LAB — APTT: aPTT: 108 seconds — ABNORMAL HIGH (ref 24–36)

## 2019-07-09 MED ORDER — ACETAMINOPHEN 650 MG RE SUPP: 650.0000 mg | Freq: Four times a day (QID) | RECTAL | Status: DC | PRN

## 2019-07-09 MED ORDER — ONDANSETRON HCL 4 MG/2ML IJ SOLN: 4.0000 mg | Freq: Four times a day (QID) | INTRAMUSCULAR | Status: DC | PRN

## 2019-07-09 MED ORDER — CARVEDILOL 25 MG PO TABS
25.0000 mg | ORAL_TABLET | Freq: Two times a day (BID) | ORAL | Status: DC
  Administered 2019-07-10 (×2): 25 mg via ORAL
  Filled 2019-07-09: qty 2
  Filled 2019-07-09 (×2): qty 1

## 2019-07-09 MED ORDER — INSULIN ASPART 100 UNIT/ML ~~LOC~~ SOLN
0.0000 [IU] | Freq: Three times a day (TID) | SUBCUTANEOUS | Status: DC
  Administered 2019-07-10: 2 [IU] via SUBCUTANEOUS
  Administered 2019-07-10: 5 [IU] via SUBCUTANEOUS
  Administered 2019-07-11 (×2): 7 [IU] via SUBCUTANEOUS
  Administered 2019-07-11: 13:00:00 9 [IU] via SUBCUTANEOUS
  Administered 2019-07-12: 13:00:00 2 [IU] via SUBCUTANEOUS
  Administered 2019-07-12: 18:00:00 7 [IU] via SUBCUTANEOUS
  Administered 2019-07-13: 14:00:00 3 [IU] via SUBCUTANEOUS
  Administered 2019-07-13 (×2): 7 [IU] via SUBCUTANEOUS

## 2019-07-09 MED ORDER — INSULIN NPH (HUMAN) (ISOPHANE) 100 UNIT/ML ~~LOC~~ SUSP
10.0000 [IU] | Freq: Two times a day (BID) | SUBCUTANEOUS | Status: DC
  Administered 2019-07-11: 20 [IU] via SUBCUTANEOUS
  Administered 2019-07-11: 10 [IU] via SUBCUTANEOUS
  Filled 2019-07-09: qty 10

## 2019-07-09 MED ORDER — TRAZODONE HCL 100 MG PO TABS
100.0000 mg | ORAL_TABLET | Freq: Every evening | ORAL | Status: DC | PRN
  Administered 2019-07-12: 23:00:00 100 mg via ORAL
  Filled 2019-07-09: qty 1

## 2019-07-09 MED ORDER — COLCHICINE 0.6 MG PO TABS
0.6000 mg | ORAL_TABLET | Freq: Every day | ORAL | Status: DC
  Administered 2019-07-10 – 2019-07-13 (×4): 0.6 mg via ORAL
  Filled 2019-07-09 (×4): qty 1

## 2019-07-09 MED ORDER — POTASSIUM CHLORIDE 10 MEQ/100ML IV SOLN
10.0000 meq | INTRAVENOUS | Status: AC
  Administered 2019-07-09 (×3): 10 meq via INTRAVENOUS
  Filled 2019-07-09 (×3): qty 100

## 2019-07-09 MED ORDER — CYCLOBENZAPRINE HCL 5 MG PO TABS
5.0000 mg | ORAL_TABLET | Freq: Two times a day (BID) | ORAL | Status: DC
  Administered 2019-07-09 – 2019-07-10 (×3): 5 mg via ORAL
  Filled 2019-07-09 (×3): qty 1

## 2019-07-09 MED ORDER — ROSUVASTATIN CALCIUM 5 MG PO TABS
10.0000 mg | ORAL_TABLET | Freq: Every day | ORAL | Status: DC
  Administered 2019-07-10 – 2019-07-13 (×4): 10 mg via ORAL
  Filled 2019-07-09 (×4): qty 2

## 2019-07-09 MED ORDER — PHYTONADIONE 5 MG PO TABS: 2.5000 mg | ORAL_TABLET | Freq: Once | ORAL | 0 refills | Status: DC

## 2019-07-09 MED ORDER — POTASSIUM CHLORIDE CRYS ER 20 MEQ PO TBCR
40.0000 meq | EXTENDED_RELEASE_TABLET | Freq: Once | ORAL | Status: AC
  Administered 2019-07-09: 40 meq via ORAL
  Filled 2019-07-09: qty 2

## 2019-07-09 MED ORDER — ONDANSETRON HCL 4 MG PO TABS: 4.0000 mg | ORAL_TABLET | Freq: Four times a day (QID) | ORAL | Status: DC | PRN

## 2019-07-09 MED ORDER — GABAPENTIN 300 MG PO CAPS
300.0000 mg | ORAL_CAPSULE | Freq: Three times a day (TID) | ORAL | Status: DC
  Administered 2019-07-09 – 2019-07-13 (×13): 300 mg via ORAL
  Filled 2019-07-09 (×13): qty 1

## 2019-07-09 MED ORDER — INSULIN ASPART 100 UNIT/ML ~~LOC~~ SOLN
3.0000 [IU] | Freq: Three times a day (TID) | SUBCUTANEOUS | Status: DC
  Administered 2019-07-10 – 2019-07-13 (×12): 3 [IU] via SUBCUTANEOUS

## 2019-07-09 MED ORDER — ACETAMINOPHEN 325 MG PO TABS
650.0000 mg | ORAL_TABLET | Freq: Four times a day (QID) | ORAL | Status: DC | PRN
  Administered 2019-07-13: 21:00:00 650 mg via ORAL
  Filled 2019-07-09 (×2): qty 2

## 2019-07-09 NOTE — Patient Instructions (Addendum)
Description   10/2-Spoke with wife-Susan and instructed pt to continue holding Warfarin today, tomorrow, Sumday and pick up and take Vitamin K 2.5mg  from pharmacy today. INR recheck on Monday. 10/2 at 922am-LMOVM for wife to continue holding and call back and if he is having bleeding to go to ER. 07/08/2019 At 522pm spoke with wife and instructed pt not to take any Coumadin today and no Coumadin tomorrow and we are awaiting the STAT lab from Hospital lab for further details. Report to ER with any bleeding or falls and accidents. At 320pm-Do not take any Coumadin until we call you with STAT results and give you instructions. Call wife with results per pt.  Normal dose: 1 tablet daily except 1/2 tablet on Saturdays. Coumadin Clinic [phone]

## 2019-07-09 NOTE — Telephone Encounter (Signed)
Spoke with pt's wife, she is aware and will pick up Vitamin K today.

## 2019-07-09 NOTE — Telephone Encounter (Signed)
Pt's wife called Coumadin clinic to follow up with Coumadin instructions. Advised her pt is to take NO coumadin for 4 days and come back on Monday for an INR recheck. He declined to go to the ER as recommended yesterday by NP on call. He is still having some bleeding from his ear. Will have pt take Vitamin K 2.5mg  today - [facility] [address] [facility] med [facility], provided them with goodrx coupon for $27 copay. Returned call to pt's wife to let her know rx is being filled at [facility] and left message for her.

## 2019-07-09 NOTE — ED Provider Notes (Addendum)
[facility] EMERGENCY DEPARTMENT Provider Note   CSN: [id number] Arrival date & time: 07/09/19  1843     History   Chief Complaint Chief Complaint  Patient presents with  . Coagulation Disorder  . Ear Drainage    HPI Kurt Cross is a 68 y.o. male presenting for evaluation of elevated INR and bleeding from the right ear.  Patient states yesterday he was found to have an elevated INR.  He thinks this happened because he got a new bottle of warfarin, thought he was supposed be taking it in addition to his current medication, instead of after he finished.  As such, he has been taking too much warfarin.  He saw his cardiologist yesterday, who prescribed him vitamin K, which he took 1.5 hours prior to arrival.  Patient states when he got home yesterday, he had some bleeding from his right ear.  This stopped, however today some days he noted bleeding from his right ear again.  Patient states he has been feeling very tired/weak for the past several days.  He does not know why.  He denies headache, vision changes, slurred speech, chest pain, shortness of breath, nausea, vomiting, abdominal pain, urinary symptoms, hematuria, or blood in his stool.  Additional history obtained from chart review.  Patient with a history of diabetes, hyperlipidemia, hypertension, OSA, A. fib on warfarin, CHF, CKD.      HPI  Past Medical History:  Diagnosis Date  . AAA (abdominal aortic aneurysm)/ 4.5 cm ascending per CT angio 08/01/13 08/12/2013  . Abscess 02/13/2018  . Acute on chronic renal failure (HCC) 01/27/2016   02/15/16 Na 130, K 4.6, Bun 47, creat 2.07    . Acute on chronic respiratory failure ([place]) 01/27/2016  . Acute on chronic respiratory failure with hypoxia ([place]) 01/27/2016   02/14/16 CXR no acute pulmonary abnormality, a nodule density noted at the right upper lobe, could represent a granuloma versus neoplastic lesion, may CT   . Acute renal failure (ARF) ([place]) 08/15/2013  . AKI  (acute kidney injury) ([place]) 11/12/2013  . Atrial fibrillation ([place])   . Atrial flutter ([place]) 02/22/2016  . Benign hypertension   . Bradycardia   . BRONCHITIS, CHRONIC 09/01/2007   Qualifier: Diagnosis of  By: Doy Mince LPN, Megan    . Cellulitis of left lower extremity 02/14/2018  . CHF (congestive heart failure), NYHA class II ([place])   . Chronic anticoagulation 02/22/2016  . Chronic combined systolic and diastolic CHF (congestive heart failure) ([place]) 08/04/2013   02/14/16 CXR no acute pulmonary abnormality, a nodule density noted at the right upper lobe, could represent a granuloma versus neoplastic lesion, may CT   . Chronic respiratory failure ([place]) 02/10/2016  . Chronic ulcer of left heel ([place]) 02/17/2018  . CKD (chronic kidney disease), stage III 02/17/2018  . Complete heart block (HCC)    s/p PPM Bi-Ven  . Constipation, chronic 08/03/2013  . Debility 02/10/2016  . Decubitus ulcer of sacral region, stage 3 ([place]) 02/14/2018  . Diabetes mellitus, type 2 ([place])   . H/O medication noncompliance 02/22/2016  . Hernia of abdominal cavity   . Hyperkalemia 11/12/2013  . Hyperlipidemia   . Hypertension   . Hypokalemia   . Hypoxemia   . Hypoxia 01/26/2016  . 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 Kidney failure   . Neuropathy   . Obesity, Class III, BMI 40-49.9 (morbid obesity) (  [place]) 09/01/2007   Qualifier: Diagnosis of  By: Doy Mince LPN, Megan    . Obstructive sleep apnea 09/01/2007   Qualifier: Diagnosis of  By: Doy Mince LPN, Megan    . OSA (obstructive sleep apnea)   . Recurrent ventral incisional hernia 08/14/2013  . Right leg pain 08/11/2013  . S/P placement of cardiac pacemaker 02/22/2016  . Sepsis ([place]) 08/02/2013  . Short gut syndrome due to distal jejunal SB fistula 10/22/2013    Patient Active Problem List   Diagnosis Date Noted  . Coumadin toxicity 07/09/2019  . Hypokalemia  07/09/2019  . Type 2 diabetes mellitus with stage 3b chronic kidney disease, with long-term current use of insulin ([place]) 07/08/2019  . Encounter for therapeutic drug monitoring 03/17/2019  . Gouty arthritis 03/12/2019  . On anticoagulant therapy 02/07/2019  . Dependence on wheelchair 02/07/2019  . Pneumatosis of gastric wall 02/05/2019  . Nausea & vomiting 02/05/2019  . Acute exacerbation of CHF (congestive heart failure) ([place]) 02/03/2019  . Diabetic ulcer of ankle associated with type 2 diabetes mellitus, limited to breakdown of skin ([place]) 12/03/2018  . CKD (chronic kidney disease) stage 4, GFR 15-29 ml/min (HCC) 02/17/2018  . S/P placement of cardiac pacemaker 02/22/2016  . Atrial flutter ([place]) 02/22/2016  . Chronic anticoagulation 02/22/2016  . H/O medication noncompliance 02/22/2016  . Debility 02/10/2016  . Constipation 02/07/2016  . Acute on chronic combined systolic and diastolic CHF, NYHA class 2 ([place]) 01/27/2016  . Acute on chronic renal failure ([place]) 01/27/2016  . Acute on chronic respiratory failure with hypoxia ([place]) 01/27/2016  . Hypoxia 01/26/2016  . Atrial fibrillation/flutter 10/06/2013  . Recurrent ventral incisional hernia 08/14/2013  . Thoracic aortic aneurysm ([place]) 08/12/2013  . Right leg pain 08/11/2013  . Chronic combined systolic and diastolic CHF (congestive heart failure) ([place]) 08/04/2013  . Essential hypertension 06/28/2012  . Diabetes mellitus, insulin dependent (IDDM), uncontrolled 09/01/2007  . Hyperlipidemia 09/01/2007  . Obesity, Class III, BMI 40-49.9 (morbid obesity) ([place]) 09/01/2007  . Obstructive sleep apnea 09/01/2007    Past Surgical History:  Procedure Laterality Date  . APPENDECTOMY    . biventricular pacemaker generator change  04/29/2014   MDT Viva CRT-P BiV pacemaker implanted by Dr Rolla Etienne in [place]  . biventricular pacemaker implantation  06/18/2007   MDT BIV pacemaker implanted by Dr Lovena Le  . BOWEL RESECTION  1980s?    ?colectomy/ostomy - Dr Elesa Hacker  . INCISION AND DRAINAGE ABSCESS N/A 08/14/2013   Procedure: INCISION AND DRAINAGE ABSCESS, mesh ;  Surgeon: [name], MD;  Location: [facility];  Service: General;  Laterality: N/A;  . [place]?   Dr. Lindon Romp - w mesh  . KNEE SURGERY Right   . LEFT HEART CATHETERIZATION WITH CORONARY ANGIOGRAM N/A 03/17/2014   Procedure: LEFT HEART CATHETERIZATION WITH CORONARY ANGIOGRAM;  Surgeon: [name], MD;  Location: [facility] CATH LAB;  Service: Cardiovascular;  Laterality: N/A;  . NASAL SEPTUM SURGERY    . OSTOMY TAKE DOWN  1980s?   Dr Elesa Hacker  . TONSILLECTOMY AND ADENOIDECTOMY          Home Medications    Prior to Admission medications   Medication Sig Start Date End Date Taking? Authorizing Provider  Accu-Chek Softclix Lancets lancets Use to test blood sugars 1-2 times daily. 05/17/19  Yes [name], MD  Blood Glucose Monitoring Suppl (ACCU-CHEK AVIVA) device Use to test blood sugars 1-2 times daily. 05/17/19 05/16/20 Yes [name], MD  carvedilol (COREG) 25  MG tablet Take 25 mg by mouth 2 (two) times daily. 07/25/14  Yes [provider]  colchicine 0.6 MG tablet Take 1 tablet (0.6 mg total) by mouth daily. Pt states he is taking 1/2 tablet daily ( since 03/11/2019) Patient taking differently: Take 0.6 mg by mouth daily.  06/08/19  Yes [name], MD  cyclobenzaprine (FLEXERIL) 5 MG tablet Take 5 mg by mouth 2 (two) times a day.   Yes [provider]  furosemide (LASIX) 20 MG tablet Take 20 mg by mouth daily.   Yes [provider]  gabapentin (NEURONTIN) 300 MG capsule Take 1 capsule (300 mg total) by mouth 3 (three) times daily. 06/22/19  Yes [name], MD  glucose blood (ACCU-CHEK AVIVA PLUS) test strip Use to test blood sugars 1-2 times daily. 05/17/19  Yes [name], MD  insulin NPH Human (NOVOLIN N) 100 UNIT/ML injection Inject 10-20 Units into the  skin 2 (two) times daily before a meal. Patient states he takes 10 units when sugar is 300, if above 300 he will take 20 units per patient   Yes [provider]  insulin regular (NOVOLIN R) 100 units/mL injection Use with sliding scale. Patient taking differently: Inject 10 Units into the skin 2 (two) times daily before a meal.  02/12/19  Yes Nita Sells, MD  metolazone (ZAROXOLYN) 2.5 MG tablet Take 1 tablet (2.5 mg total) by mouth daily. 05/13/19  Yes [name], MD  phytonadione (MEPHYTON) 5 MG tablet Take 0.5 tablets (2.5 mg total) by mouth once for 1 dose. 07/09/19 07/09/19 Yes Dorothy Spark, MD  rosuvastatin (CRESTOR) 10 MG tablet Take 1 tablet (10 mg total) by mouth daily. 05/06/19  Yes [name], MD  traZODone (DESYREL) 50 MG tablet Take 2 tablets (100 mg total) by mouth at bedtime as needed for sleep. 05/06/19  Yes [name], MD    Family History Family History  Problem Relation Age of Onset  . Diabetes Mother   . Alcohol abuse Mother   . Alcoholism Father   . Heart Problems Maternal Grandmother     Social History Social History   Tobacco Use  . Smoking status: Former Smoker    Packs/day: 1.00    Years: 40.00    Pack years: 40.00    Types: Cigarettes    Quit date: 08/21/2012    Years since quitting: 6.8  . Smokeless tobacco: Never Used  Substance Use Topics  . Alcohol use: Not Currently    Alcohol/week: 0.0 standard drinks    Comment: occasional  . Drug use: No     Allergies   Patient has no known allergies.   Review of Systems Review of Systems  HENT:       Bleeding from R ear  Neurological: Positive for weakness.  Hematological: Bruises/bleeds easily.  All other systems reviewed and are negative.    Physical Exam Updated Vital Signs BP (!) 125/91   Temp 99.8 F (37.7 C) (Oral)   Resp 20   Ht 5\' 11"  (1.803 m)   Wt 127 kg   SpO2 94%   BMI 39.05 kg/m   Physical Exam Vitals signs and nursing  note reviewed.  Constitutional:      Appearance: He is well-developed. He is ill-appearing.     Comments: Obese male who appears tired/ill  HENT:     Head: Normocephalic and atraumatic.     Ears:     Comments: Right ear canal with dried  blood which is blocking the view of the TM.      Mouth/Throat:     Comments: No bleeding from the gums Eyes:     Extraocular Movements: Extraocular movements intact.     Conjunctiva/sclera: Conjunctivae normal.     Pupils: Pupils are equal, round, and reactive to light.  Neck:     Musculoskeletal: Normal range of motion and neck supple.  Cardiovascular:     Rate and Rhythm: Normal rate and regular rhythm.     Pulses: Normal pulses.  Pulmonary:     Effort: Pulmonary effort is normal. No respiratory distress.     Breath sounds: Normal breath sounds. No wheezing.  Abdominal:     General: There is no distension.     Palpations: Abdomen is soft. There is no mass.     Tenderness: There is no abdominal tenderness. There is no guarding or rebound.  Musculoskeletal: Normal range of motion.  Skin:    General: Skin is warm and dry.     Capillary Refill: Capillary refill takes less than 2 seconds.  Neurological:     Mental Status: He is alert and oriented to person, place, and time.      ED Treatments / Results  Labs (all labs ordered are listed, but only abnormal results are displayed) Labs Reviewed  CBC WITH DIFFERENTIAL/PLATELET - Abnormal; Notable for the following components:      Result Value   WBC 14.5 (*)    Hemoglobin 17.1 (*)    Neutro Abs 12.9 (*)    Abs Immature Granulocytes 0.08 (*)    All other components within normal limits  COMPREHENSIVE METABOLIC PANEL - Abnormal; Notable for the following components:   Sodium 131 (*)    Potassium 2.3 (*)    Chloride 79 (*)    CO2 33 (*)    Glucose, Bld 334 (*)    BUN 77 (*)    Creatinine, Ser 2.89 (*)    Albumin 2.8 (*)    Alkaline Phosphatase 26 (*)    Total Bilirubin 1.3 (*)    GFR  calc non Af Amer 22 (*)    GFR calc Af Amer 25 (*)    Anion gap 19 (*)    All other components within normal limits  PROTIME-INR - Abnormal; Notable for the following components:   Prothrombin Time 67.3 (*)    INR 8.3 (*)    All other components within normal limits  APTT - Abnormal; Notable for the following components:   aPTT 108 (*)    All other components within normal limits  I-STAT CHEM 8, ED - Abnormal; Notable for the following components:   Sodium 133 (*)    Potassium 2.2 (*)    Chloride 79 (*)    BUN 71 (*)    Creatinine, Ser 2.50 (*)    Glucose, Bld 328 (*)    Calcium, Ion 0.98 (*)    TCO2 35 (*)    Hemoglobin 18.7 (*)    HCT 55.0 (*)    All other components within normal limits  CBG MONITORING, ED - Abnormal; Notable for the following components:   Glucose-Capillary 362 (*)    All other components within normal limits  SARS CORONAVIRUS 2 (HOSPITAL ORDER, [facility])  SARS CORONAVIRUS 2 (TAT 6-24 HRS)  URINALYSIS, ROUTINE W REFLEX MICROSCOPIC  BASIC METABOLIC PANEL  PROTIME-INR  HIV ANTIBODY (ROUTINE TESTING W REFLEX)  HIV4GL SAVE TUBE    EKG None  Radiology Ct  Head Wo Contrast  Result Date: 07/09/2019 CLINICAL DATA:  Blood draining from right ear yesterday. The patient takes Coumadin. Elevated INR. EXAM: CT HEAD WITHOUT CONTRAST TECHNIQUE: Contiguous axial images were obtained from the base of the skull through the vertex without intravenous contrast. COMPARISON:  None. FINDINGS: Brain: 3 mm hypodensity in the left lentiform nucleus on image 18/3, possibly a remote small lacunar infarct or dilated perivascular space. Periventricular white matter and corona radiata hypodensities favor chronic ischemic microvascular white matter disease. Otherwise, the brainstem, cerebellum, cerebral peduncles, thalamus, basal ganglia, basilar cisterns, and ventricular system appear within normal limits. No intracranial hemorrhage, mass lesion, or acute  CVA. Vascular: There is atherosclerotic calcification of the cavernous carotid arteries bilaterally. Skull: Unremarkable Sinuses/Orbits: Minimal mucosal thickening in the ethmoid air cells compatible with mild chronic sinusitis. Material in the right external auditory canal could represent blood clot or cerumen. Other: No supplemental non-categorized findings. IMPRESSION: 1. No acute intracranial findings. 2. 3 mm in diameter dilated perivascular space or tiny remote lacunar infarct in the left lentiform nucleus. 3. Periventricular white matter and corona radiata hypodensities favor chronic ischemic microvascular white matter disease. 4. Material in the right external auditory canal could represent blood clot or cerumen. Electronically Signed   By: Van Clines M.D.   On: 07/09/2019 20:08    Procedures .Critical Care Performed by: Franchot Heidelberg, PA-C Authorized by: Franchot Heidelberg, PA-C   Critical care provider statement:    Critical care time (minutes):  35   Critical care time was exclusive of:  Separately billable procedures and treating other patients and teaching time   Critical care was necessary to treat or prevent imminent or life-threatening deterioration of the following conditions:  Metabolic crisis (coagulation disorder)   Critical care was time spent personally by me on the following activities:  Blood draw for specimens, development of treatment plan with patient or surrogate, examination of patient, obtaining history from patient or surrogate, ordering and performing treatments and interventions, ordering and review of laboratory studies, ordering and review of radiographic studies, pulse oximetry, re-evaluation of patient's condition, review of old charts and discussions with consultants   I assumed direction of critical care for this patient from another provider in my specialty: no   Comments:     Pt with critically high INR and critically low potassium. Started on IV  potassium. As pt already had vitamin K, admitted for INR trending.    (including critical care time)  Medications Ordered in ED Medications  potassium chloride 10 mEq in 100 mL IVPB (10 mEq Intravenous New Bag/Given 07/09/19 2022)  acetaminophen (TYLENOL) tablet 650 mg (has no administration in time range)    Or  acetaminophen (TYLENOL) suppository 650 mg (has no administration in time range)  ondansetron (ZOFRAN) tablet 4 mg (has no administration in time range)    Or  ondansetron (ZOFRAN) injection 4 mg (has no administration in time range)  potassium chloride SA (KLOR-CON) CR tablet 40 mEq (40 mEq Oral Given 07/09/19 2023)     Initial Impression / Assessment and Plan / ED Course  I have reviewed the triage vital signs and the nursing notes.  Pertinent labs & imaging results that were available during my care of the patient were reviewed by me and considered in my medical decision making (see chart for details).        Patient presenting for evaluation of bleeding from his ear in the setting of an elevated INR.  Physical exam shows what appears to be  ear canal trauma, however TM is unable to be visualized.  As patient had INR greater than 10 yesterday, concern for possible intracranial bleed.  No obvious bleeding noted elsewhere.  Will obtain labs including hemoglobin and PT/INR.  Will obtain CT head for further evaluation.  CT head negative for acute bleed.  Does show what appears to be a blood clot in the right ear canal.  No other acute findings.  Labs show hypokalemia at 2.2. will give K orally and IV.  Creatinine at baseline, however BUN is elevated.  Slight leukocytosis at 14.5, unknown cause.  INR elevated to 8.3, although this is improved from yesterday.  Will consult with pharmacy, as patient recently took vitamin K.  Discussed with pharmacy who states if he is not having life-threatening bleeding, will not need further vitamin K dosing today.  Recommends holding warfarin and  trending INR down over the next several days.  If patient starts bleeding, can give vitamin K as needed.  Will call for admission to the hospitalist service.  Discussed with Dr. Alcario Drought from [facility], patient to be admitted for hypokalemia and elevated INR.  Final Clinical Impressions(s) / ED Diagnoses   Final diagnoses:  Elevated INR  Hypokalemia    ED Discharge Orders    None       Franchot Heidelberg, PA-C 07/10/19 0026    Franchot Heidelberg, PA-C 07/10/19 [id number]    Malvin Johns, MD 07/23/19 1521

## 2019-07-09 NOTE — ED Triage Notes (Signed)
Pt arrives via [facility] from home,  Pt is on Coumadin regularly at home. Pt's caretaker notices blood draining from R ear yesterday at 1200. Pt was given a reversal agent. Caretaker noticed blood from R ear again today at 1500.  Hx of HTN, and diabetes.  No SOB or chest pain.

## 2019-07-09 NOTE — Telephone Encounter (Signed)
I spoke with pt's wife. We went over the message below. A cyst was found on the x-[place] that [facility] performed. He will be meeting with a surgeon to see if he needs surgery or if they are able to do a different procedure. I placed a copy of pt's medication list up front for pt's wife to come pick up. I also have her number in the chart as the primary contact for his healthcare needs. She is aware that we will let her know when he is due for follow up. His INR was extremely elevated and cardiology is working on getting that down.

## 2019-07-09 NOTE — H&P (Signed)
History and Physical    Kurt Cross [id number] DOB: Mar 27, 1951 DOA: 07/09/2019  PCP: [name], MD  Patient coming from: Home  I have personally briefly reviewed patient's old medical records in [place]  Chief Complaint: Ear bleed  HPI: Kurt Cross is a 68 y.o. male with medical history significant of A.Fib on coumadin, chronic combined CHF, CKD stage 3, DM, HTN.  Patient with blood draining from R ear yesterday at 1200.  Went to coumadin clinic.  INR 10.  Coumadin to be held for 4 days and given script for 2.5 vit K.  Again small amount of bleeding from R ear today at 1500.  Patient's wife went, got the prescribed Vit K.  Gave it to patient and took patient in to ED.  No headache, no SOB, no CP, no GI bleed.   ED Course: HGB 17.1.  K 2.3, sodium 131 cl 79, BUN 77, creat 2.89.  BUN 30s and creat 2.0 range in May.  CT head: no ICH, just debris in the ear canal.   Review of Systems: As per HPI, otherwise all review of systems negative.  Past Medical History:  Diagnosis Date   AAA (abdominal aortic aneurysm)/ 4.5 cm ascending per CT angio 08/01/13 08/12/2013   Abscess 02/13/2018   Acute on chronic renal failure (HCC) 01/27/2016   02/15/16 Na 130, K 4.6, Bun 47, creat 2.07     Acute on chronic respiratory failure (HCC) 01/27/2016   Acute on chronic respiratory failure with hypoxia ([place]) 01/27/2016   02/14/16 CXR no acute pulmonary abnormality, a nodule density noted at the right upper lobe, could represent a granuloma versus neoplastic lesion, may CT    Acute renal failure (ARF) ([place]) 08/15/2013   AKI (acute kidney injury) ([place]) 11/12/2013   Atrial fibrillation ([place])    Atrial flutter ([place]) 02/22/2016   Benign hypertension    Bradycardia    BRONCHITIS, CHRONIC 09/01/2007   Qualifier: Diagnosis of  By: Doy Mince LPN, Megan     Cellulitis of left lower extremity 02/14/2018   CHF (congestive heart failure), NYHA class II (HCC)    Chronic  anticoagulation 02/22/2016   Chronic combined systolic and diastolic CHF (congestive heart failure) ([place]) 08/04/2013   02/14/16 CXR no acute pulmonary abnormality, a nodule density noted at the right upper lobe, could represent a granuloma versus neoplastic lesion, may CT    Chronic respiratory failure ([place]) 02/10/2016   Chronic ulcer of left heel ([place]) 02/17/2018   CKD (chronic kidney disease), stage III 02/17/2018   Complete heart block ([place])    s/p PPM Bi-Ven   Constipation, chronic 08/03/2013   Debility 02/10/2016   Decubitus ulcer of sacral region, stage 3 ([place]) 02/14/2018   Diabetes mellitus, type 2 ([place])    H/O medication noncompliance 02/22/2016   Hernia of abdominal cavity    Hyperkalemia 11/12/2013   Hyperlipidemia    Hypertension    Hypokalemia    Hypoxemia    Hypoxia 01/26/2016   Infected prosthetic mesh of abdominal wall with GIANT abscess s/p removal 08/14/2013 08/14/2013   Jejunal SB fistula into abdominal wound 08/23/2013   Kidney failure    Neuropathy    Obesity, Class III, BMI 40-49.9 (morbid obesity) ([place]) 09/01/2007   Qualifier: Diagnosis of  By: Doy Mince LPN, Megan     Obstructive sleep apnea 09/01/2007   Qualifier: Diagnosis of  By: Doy Mince LPN, Megan     OSA (obstructive sleep apnea)    Recurrent ventral incisional hernia 08/14/2013  Right leg pain 08/11/2013   S/P placement of cardiac pacemaker 02/22/2016   Sepsis ([place]) 08/02/2013   Short gut syndrome due to distal jejunal SB fistula 10/22/2013    Past Surgical History:  Procedure Laterality Date   APPENDECTOMY     biventricular pacemaker generator change  04/29/2014   MDT Viva CRT-P BiV pacemaker implanted by Dr Rolla Etienne in [place]   biventricular pacemaker implantation  06/18/2007   MDT BIV pacemaker implanted by Dr Lovena Le   BOWEL RESECTION  1980s?   ?colectomy/ostomy - Dr Elesa Hacker   INCISION AND DRAINAGE ABSCESS N/A 08/14/2013   Procedure: INCISION AND DRAINAGE ABSCESS, mesh ;   Surgeon: [name], MD;  Location: [facility] ORS;  Service: General;  Laterality: N/A;   [place]?   Dr. Lindon Romp - w mesh   KNEE SURGERY Right    LEFT HEART CATHETERIZATION WITH CORONARY ANGIOGRAM N/A 03/17/2014   Procedure: LEFT HEART CATHETERIZATION WITH CORONARY ANGIOGRAM;  Surgeon: [name], MD;  Location: [facility] CATH LAB;  Service: Cardiovascular;  Laterality: N/A;   NASAL SEPTUM SURGERY     OSTOMY TAKE DOWN  1980s?   Dr Elesa Hacker   TONSILLECTOMY AND ADENOIDECTOMY       reports that he quit smoking about 6 years ago. His smoking use included cigarettes. He has a 40.00 pack-year smoking history. He has never used smokeless tobacco. He reports previous alcohol use. He reports that he does not use drugs.  No Known Allergies  Family History  Problem Relation Age of Onset   Diabetes Mother    Alcohol abuse Mother    Alcoholism Father    Heart Problems Maternal Grandmother      Prior to Admission medications   Medication Sig Start Date End Date Taking? Authorizing Provider  Accu-Chek Softclix Lancets lancets Use to test blood sugars 1-2 times daily. 05/17/19   [name], MD  Blood Glucose Monitoring Suppl (ACCU-CHEK AVIVA) device Use to test blood sugars 1-2 times daily. 05/17/19 05/16/20  [name], MD  carvedilol (COREG) 25 MG tablet Take 25 mg by mouth 2 (two) times daily. 07/25/14   [provider]  colchicine 0.6 MG tablet Take 1 tablet (0.6 mg total) by mouth daily. Pt states he is taking 1/2 tablet daily ( since 03/11/2019) 06/08/19   [name], MD  cyclobenzaprine (FLEXERIL) 5 MG tablet Take 5 mg by mouth 2 (two) times a day.    [provider]  furosemide (LASIX) 80 MG tablet Take 1 tablet (80 mg total) by mouth daily. 07/08/19   [name], MD  gabapentin (NEURONTIN) 300 MG capsule Take 1 capsule (300 mg total) by mouth 3 (three) times daily. 06/22/19   [name], MD    glucose blood (ACCU-CHEK AVIVA PLUS) test strip Use to test blood sugars 1-2 times daily. 05/17/19   [name], MD  insulin NPH Human (NOVOLIN N) 100 UNIT/ML injection Inject 10-20 Units into the skin 2 (two) times daily before a meal.    [provider]  insulin regular (NOVOLIN R) 100 units/mL injection Use with sliding scale. 02/12/19   Nita Sells, MD  Magnesium Oxide 200 MG TABS Take 1 tablet (200 mg total) by mouth daily. 03/09/19   Shirley Friar, PA-C  metolazone (ZAROXOLYN) 2.5 MG tablet Take 1 tablet (2.5 mg total) by mouth daily. 05/13/19   [name], MD  phytonadione (MEPHYTON) 5 MG tablet Take 0.5 tablets (2.5 mg  total) by mouth once for 1 dose. 07/09/19 07/09/19  Dorothy Spark, MD  rosuvastatin (CRESTOR) 10 MG tablet Take 1 tablet (10 mg total) by mouth daily. 05/06/19   [name], MD  Skin Protectants, Misc. (EUCERIN) cream Apply topically as needed for wound care. 12/08/18   [name], MD  spironolactone (ALDACTONE) 25 MG tablet Take 1 tablet (25 mg total) by mouth daily. 03/09/19 06/07/19  Shirley Friar, PA-C  traZODone (DESYREL) 50 MG tablet Take 2 tablets (100 mg total) by mouth at bedtime as needed for sleep. 05/06/19   [name], MD  warfarin (COUMADIN) 10 MG tablet Take as directed by the Coumadin Clinic 06/17/19   Dorothy Spark, MD    Physical Exam: Vitals:   07/09/19 1857 07/09/19 1858  BP: (!) 125/91   Resp: 20   Temp: 99.8 F (37.7 C)   TempSrc: Oral   SpO2: 94%   Weight:  127 kg  Height:  5\' 11"  (1.803 m)    Constitutional: NAD, calm, comfortable Eyes: PERRL, lids and conjunctivae normal ENMT: Mucous membranes are moist. Posterior pharynx clear of any exudate or lesions.Normal dentition.  Neck: normal, supple, no masses, no thyromegaly Respiratory: clear to auscultation bilaterally, no wheezing, no crackles. Normal respiratory effort. No accessory muscle use.   Cardiovascular: Regular rate and rhythm, no murmurs / rubs / gallops. No extremity edema. 2+ pedal pulses. No carotid bruits.  Abdomen: no tenderness, no masses palpated. No hepatosplenomegaly. Bowel sounds positive.  Musculoskeletal: no clubbing / cyanosis. No joint deformity upper and lower extremities. Good ROM, no contractures. Normal muscle tone.  Skin: no rashes, lesions, ulcers. No induration Neurologic: CN 2-12 grossly intact. Sensation intact, DTR normal. Strength 5/5 in all 4.  Psychiatric: Normal judgment and insight. Alert and oriented x 3. Normal mood.    Labs on Admission: I have personally reviewed following labs and imaging studies  CBC: Recent Labs  Lab 07/09/19 1854 07/09/19 1918  WBC 14.5*  --   NEUTROABS 12.9*  --   HGB 17.1* 18.7*  HCT 49.4 55.0*  MCV 93.6  --   PLT 274  --    Basic Metabolic Panel: Recent Labs  Lab 07/09/19 1854 07/09/19 1918  NA 131* 133*  K 2.3* 2.2*  CL 79* 79*  CO2 33*  --   GLUCOSE 334* 328*  BUN 77* 71*  CREATININE 2.89* 2.50*  CALCIUM 8.9  --    GFR: Estimated Creatinine Clearance: 39.5 mL/min (A) (by C-G formula based on SCr of 2.5 mg/dL (H)). Liver Function Tests: Recent Labs  Lab 07/09/19 1854  AST 24  ALT 15  ALKPHOS 26*  BILITOT 1.3*  PROT 8.1  ALBUMIN 2.8*   No results for input(s): LIPASE, AMYLASE in the last 168 hours. No results for input(s): AMMONIA in the last 168 hours. Coagulation Profile: Recent Labs  Lab 07/08/19 0000 07/08/19 1512 07/08/19 1752 07/09/19 1854  INR >10.0* 8.0* >10.0* 8.3*   Cardiac Enzymes: No results for input(s): CKTOTAL, CKMB, CKMBINDEX, TROPONINI in the last 168 hours. BNP (last 3 results) No results for input(s): PROBNP in the last 8760 hours. HbA1C: No results for input(s): HGBA1C in the last 72 hours. CBG: Recent Labs  Lab 07/09/19 1902  GLUCAP 362*   Lipid Profile: No results for input(s): CHOL, HDL, LDLCALC, TRIG, CHOLHDL, LDLDIRECT in the last 72  hours. Thyroid Function Tests: No results for input(s): TSH, T4TOTAL, FREET4, T3FREE, THYROIDAB in the last 72 hours. Anemia Panel: No  results for input(s): VITAMINB12, FOLATE, FERRITIN, TIBC, IRON, RETICCTPCT in the last 72 hours. Urine analysis:    Component Value Date/Time   COLORURINE YELLOW 02/03/2019 1859   APPEARANCEUR HAZY (A) 02/03/2019 1859   LABSPEC 1.008 02/03/2019 1859   PHURINE 5.0 02/03/2019 1859   GLUCOSEU NEGATIVE 02/03/2019 1859   HGBUR SMALL (A) 02/03/2019 1859   BILIRUBINUR NEGATIVE 02/03/2019 1859   KETONESUR NEGATIVE 02/03/2019 1859   PROTEINUR 100 (A) 02/03/2019 1859   UROBILINOGEN 0.2 11/13/2013 0630   NITRITE NEGATIVE 02/03/2019 1859   LEUKOCYTESUR LARGE (A) 02/03/2019 1859    Radiological Exams on Admission: Ct Head Wo Contrast  Result Date: 07/09/2019 CLINICAL DATA:  Blood draining from right ear yesterday. The patient takes Coumadin. Elevated INR. EXAM: CT HEAD WITHOUT CONTRAST TECHNIQUE: Contiguous axial images were obtained from the base of the skull through the vertex without intravenous contrast. COMPARISON:  None. FINDINGS: Brain: 3 mm hypodensity in the left lentiform nucleus on image 18/3, possibly a remote small lacunar infarct or dilated perivascular space. Periventricular white matter and corona radiata hypodensities favor chronic ischemic microvascular white matter disease. Otherwise, the brainstem, cerebellum, cerebral peduncles, thalamus, basal ganglia, basilar cisterns, and ventricular system appear within normal limits. No intracranial hemorrhage, mass lesion, or acute CVA. Vascular: There is atherosclerotic calcification of the cavernous carotid arteries bilaterally. Skull: Unremarkable Sinuses/Orbits: Minimal mucosal thickening in the ethmoid air cells compatible with mild chronic sinusitis. Material in the right external auditory canal could represent blood clot or cerumen. Other: No supplemental non-categorized findings. IMPRESSION: 1. No acute  intracranial findings. 2. 3 mm in diameter dilated perivascular space or tiny remote lacunar infarct in the left lentiform nucleus. 3. Periventricular white matter and corona radiata hypodensities favor chronic ischemic microvascular white matter disease. 4. Material in the right external auditory canal could represent blood clot or cerumen. Electronically Signed   By: Van Clines M.D.   On: 07/09/2019 20:08    EKG: Independently reviewed.  Assessment/Plan Principal Problem:   Coumadin toxicity Active Problems:   Essential hypertension   Chronic combined systolic and diastolic CHF (congestive heart failure) (HCC)   CKD (chronic kidney disease) stage 4, GFR 15-29 ml/min (HCC)   Type 2 diabetes mellitus with stage 3b chronic kidney disease, with long-term current use of insulin (HCC)   Hypokalemia    1. Coumadin toxicity - 1. Hold coumadin 2. Got 2.5mg  vit K (prescribed by coumadin clinic yesterday) before coming in tonight 3. Minor bleed from ear, no major bleeding at this time to indicate faster / emergent reversal 4. Repeat INR in AM 2. Hypokalemia - 1. Replace K 2. Repeat BMP in AM 3. Chronic combined CHF - 1. BMP numbers actually look slightly dehydrated / pre-renal today with BUN 77 up from baseline in the 30s 2. Will "gently hydrate" by holding tomorrows diuretic doses 4. HTN - 1. Continue coreg 5. DM2 - 1. Continue home NPH and SSI  DVT prophylaxis: Supratheraputic INR Code Status: DNR - confirmed with patient Family Communication: No family in room Disposition Plan: Home after admit Consults called: None Admission status: Place in 56    Velmer Woelfel, [place] [facility]  How to contact the [facility] Attending or Consulting provider [place] or covering provider during after hours [place], for this patient?  1. Check the care team in [facility] and look for a) attending/consulting [facility] provider listed and b) the [facility] team listed 2. Log into [email or web address]  Amion  Physician Scheduling and messaging for groups  and whole hospitals  On call and physician scheduling software for group practices, residents, hospitalists and other medical providers for call, clinic, rotation and shift schedules. On[employer] is a hospital-wide system for scheduling doctors and paging doctors on call. EasyPlot is for scientific plotting and data analysis.  [email or web address]  and use [place]'s universal password to access. If you do not have the password, please contact the hospital operator.  3. Locate the [facility] provider you are looking for under [facility] and page to a number that you can be directly reached. 4. If you still have difficulty reaching the provider, please page the [facility] (Director on Call) for the Hospitalists listed on amion for assistance.  07/09/2019, 8:39 PM

## 2019-07-10 DIAGNOSIS — E1122 Type 2 diabetes mellitus with diabetic chronic kidney disease: Secondary | ICD-10-CM | POA: Diagnosis present

## 2019-07-10 DIAGNOSIS — I4892 Unspecified atrial flutter: Secondary | ICD-10-CM | POA: Diagnosis not present

## 2019-07-10 DIAGNOSIS — N184 Chronic kidney disease, stage 4 (severe): Secondary | ICD-10-CM | POA: Diagnosis present

## 2019-07-10 DIAGNOSIS — E1140 Type 2 diabetes mellitus with diabetic neuropathy, unspecified: Secondary | ICD-10-CM | POA: Diagnosis present

## 2019-07-10 DIAGNOSIS — E785 Hyperlipidemia, unspecified: Secondary | ICD-10-CM | POA: Diagnosis present

## 2019-07-10 DIAGNOSIS — Z950 Presence of cardiac pacemaker: Secondary | ICD-10-CM | POA: Diagnosis not present

## 2019-07-10 DIAGNOSIS — G4733 Obstructive sleep apnea (adult) (pediatric): Secondary | ICD-10-CM | POA: Diagnosis present

## 2019-07-10 DIAGNOSIS — I130 Hypertensive heart and chronic kidney disease with heart failure and stage 1 through stage 4 chronic kidney disease, or unspecified chronic kidney disease: Secondary | ICD-10-CM | POA: Diagnosis present

## 2019-07-10 DIAGNOSIS — Z833 Family history of diabetes mellitus: Secondary | ICD-10-CM | POA: Diagnosis not present

## 2019-07-10 DIAGNOSIS — Z811 Family history of alcohol abuse and dependence: Secondary | ICD-10-CM | POA: Diagnosis not present

## 2019-07-10 DIAGNOSIS — I482 Chronic atrial fibrillation, unspecified: Secondary | ICD-10-CM | POA: Diagnosis present

## 2019-07-10 DIAGNOSIS — I10 Essential (primary) hypertension: Secondary | ICD-10-CM | POA: Diagnosis not present

## 2019-07-10 DIAGNOSIS — Z66 Do not resuscitate: Secondary | ICD-10-CM | POA: Diagnosis present

## 2019-07-10 DIAGNOSIS — K921 Melena: Secondary | ICD-10-CM | POA: Diagnosis present

## 2019-07-10 DIAGNOSIS — Z20828 Contact with and (suspected) exposure to other viral communicable diseases: Secondary | ICD-10-CM | POA: Diagnosis present

## 2019-07-10 DIAGNOSIS — K912 Postsurgical malabsorption, not elsewhere classified: Secondary | ICD-10-CM | POA: Diagnosis present

## 2019-07-10 DIAGNOSIS — Z7901 Long term (current) use of anticoagulants: Secondary | ICD-10-CM | POA: Diagnosis not present

## 2019-07-10 DIAGNOSIS — I4821 Permanent atrial fibrillation: Secondary | ICD-10-CM

## 2019-07-10 DIAGNOSIS — I442 Atrioventricular block, complete: Secondary | ICD-10-CM | POA: Diagnosis present

## 2019-07-10 DIAGNOSIS — E876 Hypokalemia: Secondary | ICD-10-CM | POA: Diagnosis present

## 2019-07-10 DIAGNOSIS — R791 Abnormal coagulation profile: Secondary | ICD-10-CM | POA: Diagnosis not present

## 2019-07-10 DIAGNOSIS — T45511A Poisoning by anticoagulants, accidental (unintentional), initial encounter: Secondary | ICD-10-CM | POA: Diagnosis present

## 2019-07-10 DIAGNOSIS — I714 Abdominal aortic aneurysm, without rupture: Secondary | ICD-10-CM | POA: Diagnosis present

## 2019-07-10 DIAGNOSIS — J42 Unspecified chronic bronchitis: Secondary | ICD-10-CM | POA: Diagnosis present

## 2019-07-10 DIAGNOSIS — I5042 Chronic combined systolic (congestive) and diastolic (congestive) heart failure: Secondary | ICD-10-CM | POA: Diagnosis present

## 2019-07-10 DIAGNOSIS — Z794 Long term (current) use of insulin: Secondary | ICD-10-CM | POA: Diagnosis not present

## 2019-07-10 DIAGNOSIS — J9611 Chronic respiratory failure with hypoxia: Secondary | ICD-10-CM | POA: Diagnosis present

## 2019-07-10 LAB — PROTIME-INR
INR: 8.3 (ref 0.8–1.2)
INR: 7.6 (ref 0.8–1.2)
INR: 5 (ref 0.8–1.2)
Prothrombin Time: 67.3 seconds — ABNORMAL HIGH (ref 11.4–15.2)
Prothrombin Time: 63.2 seconds — ABNORMAL HIGH (ref 11.4–15.2)
Prothrombin Time: 45.6 seconds — ABNORMAL HIGH (ref 11.4–15.2)

## 2019-07-10 LAB — BASIC METABOLIC PANEL
Anion gap: 17 — ABNORMAL HIGH (ref 5–15)
BUN: 76 mg/dL — ABNORMAL HIGH (ref 8–23)
CO2: 31 mmol/L (ref 22–32)
Calcium: 8.8 mg/dL — ABNORMAL LOW (ref 8.9–10.3)
Chloride: 87 mmol/L — ABNORMAL LOW (ref 98–111)
Creatinine, Ser: 2.79 mg/dL — ABNORMAL HIGH (ref 0.61–1.24)
GFR calc Af Amer: 26 mL/min — ABNORMAL LOW (ref >60)
GFR calc non Af Amer: 23 mL/min — ABNORMAL LOW (ref >60)
Glucose, Bld: 153 mg/dL — ABNORMAL HIGH (ref 70–99)
Potassium: 2.3 mmol/L — CL (ref 3.5–5.1)
Sodium: 135 mmol/L (ref 135–145)

## 2019-07-10 LAB — HIV ANTIBODY (ROUTINE TESTING W REFLEX): HIV Screen 4th Generation wRfx: NONREACTIVE

## 2019-07-10 LAB — GLUCOSE, CAPILLARY
Glucose-Capillary: 291 mg/dL — ABNORMAL HIGH (ref 70–99)
Glucose-Capillary: 385 mg/dL — ABNORMAL HIGH (ref 70–99)

## 2019-07-10 LAB — MAGNESIUM: Magnesium: 1.5 mg/dL — ABNORMAL LOW (ref 1.7–2.4)

## 2019-07-10 LAB — CBG MONITORING, ED: Glucose-Capillary: 116 mg/dL — ABNORMAL HIGH (ref 70–99)

## 2019-07-10 MED ORDER — INSULIN ASPART 100 UNIT/ML ~~LOC~~ SOLN
5.0000 [IU] | Freq: Once | SUBCUTANEOUS | Status: AC
  Administered 2019-07-10: 22:00:00 5 [IU] via SUBCUTANEOUS

## 2019-07-10 MED ORDER — MAGNESIUM SULFATE 2 GM/50ML IV SOLN
2.0000 g | Freq: Once | INTRAVENOUS | Status: AC
  Administered 2019-07-10: 17:00:00 2 g via INTRAVENOUS
  Filled 2019-07-10: qty 50

## 2019-07-10 MED ORDER — POTASSIUM CHLORIDE CRYS ER 20 MEQ PO TBCR
40.0000 meq | EXTENDED_RELEASE_TABLET | ORAL | Status: AC
  Administered 2019-07-10 (×2): 40 meq via ORAL
  Filled 2019-07-10 (×2): qty 2

## 2019-07-10 NOTE — ED Notes (Signed)
Ordered breakfast--Kurt Cross 

## 2019-07-10 NOTE — ED Notes (Signed)
Pt placed back on 2L [place] for sats of 88% while sleeping.  Pt awoken, repositioned and fed breakfast by RN.  He states he is able to feed himself at home, but made no effort.

## 2019-07-10 NOTE — ED Notes (Signed)
Lunch Tray Ordered @ 1056. 

## 2019-07-10 NOTE — Progress Notes (Signed)
CRITICAL VALUE ALERT  Critical Value:  INR 5.0  Date & Time Notied:  07/10/19, 1457  Provider Notified: Dr. Wynelle Cleveland  Orders Received/Actions taken: Sent page, awaiting orders

## 2019-07-10 NOTE — Progress Notes (Signed)
Received patient from ED. Patient alert and oriented. No current bleeding from right ear. Patient states the last bleed was yesterday. Patient provided call bell and instructed to utilize should he need anything. Will continue to monitor.

## 2019-07-10 NOTE — ED Notes (Signed)
MD aware of abnormal labs. 

## 2019-07-10 NOTE — Progress Notes (Signed)
PROGRESS NOTE    Kurt Cross   [id number]  DOB: October 07, 1951  DOA: 07/09/2019 PCP: [name], MD   Brief Narrative:  Kurt Cross is a 68 y/o male with A-fib on Coumadin, chronic combined CHF, CKD 3, DM2, HTN whose caretaker noted blood draining from his right ear a day ago. His INR was found to be 10.2 on 07/08/19.The patient had received Vit K to reverse coumadin. After being given Vit K, he had another episode of bleeding about an hour and a half later from the ear and then came to the ED.  In ED> Right ear canal noted to have dried blood INR in ED 8.3 K 2.3  Admitted for K replacement  Subjective: He feels he can hear better from right ear today.    Assessment & Plan:   Principal Problem:   Hypokalemia,  hypomagnesemia - K is still low at 2.3- this is likely due to Lasix and Zaroxolyn taken as outpt which are on hold - cont to replace  - Mg low as well- replacing   Active Problems:   Coumadin toxicity/ bleeding from ear - bleeding has resolved - CT head> Material in the right external auditory canal could represent blood clot or cerumen - INR now 5- cont to hold coumadin for now   Fever, leukocytosis - temp 100.1 at 8 AM - WBC count is 14.5  - follow- recheck WBC count - COVID neg    Chronic combined systolic and diastolic CHF (congestive heart failure)     Atrial fibrillation/flutter   S/P placement of cardiac pacemaker    CKD (chronic kidney disease) stage 4, GFR 15-29 ml/min  - Cr 2.89 up from 2.3-2.4- holding diuretics    Type 2 diabetes mellitus with stage 3b chronic kidney disease, with long-term current use of insulin (HCC)  - cont Novolin and SSI  Elevated Hb - ? hemoconcentrated- follow in AM   Time spent in minutes: 35 DVT prophylaxis: elevated INR Code Status: DNR Family Communication:  Disposition Plan: home when K replaced Consultants:   none Procedures:   none Antimicrobials:  Anti-infectives (From admission, onward)    None       Objective: Vitals:   07/10/19 1124 07/10/19 1130 07/10/19 1215 07/10/19 1453  BP:  119/76 136/75 111/89  Pulse: 70 70 69 72  Resp: 20 19 20 18   Temp:    97.8 F (36.6 C)  TempSrc:    Oral  SpO2: 93% 92% 96% 100%  Weight:    126.2 kg  Height:    5\' 11"  (1.803 m)    Intake/Output Summary (Last 24 hours) at 07/10/2019 1552 Last data filed at 07/10/2019 1454 Gross per 24 hour  Intake 339 ml  Output -  Net 339 ml   Filed Weights   07/09/19 1858 07/10/19 1453  Weight: 127 kg 126.2 kg    Examination: General exam: Appears comfortable  HEENT: PERRLA, oral mucosa moist, no sclera icterus or thrush Respiratory system: Clear to auscultation. Respiratory effort normal. Cardiovascular system: S1 & S2 heard, RRR.   Gastrointestinal system: Abdomen soft, non-tender, nondistended. Normal bowel sounds. Central nervous system: Alert and oriented. No focal neurological deficits. Extremities: No cyanosis, clubbing or edema Skin: No rashes or ulcers Psychiatry:  Mood & affect appropriate.   Data Reviewed: I have personally reviewed following labs and imaging studies  CBC: Recent Labs  Lab 07/09/19 1854 07/09/19 1918  WBC 14.5*  --   NEUTROABS 12.9*  --  HGB 17.1* 18.7*  HCT 49.4 55.0*  MCV 93.6  --   PLT 274  --    Basic Metabolic Panel: Recent Labs  Lab 07/09/19 1854 07/09/19 1918 07/10/19 0516  NA 131* 133* 135  K 2.3* 2.2* 2.3*  CL 79* 79* 87*  CO2 33*  --  31  GLUCOSE 334* 328* 153*  BUN 77* 71* 76*  CREATININE 2.89* 2.50* 2.79*  CALCIUM 8.9  --  8.8*  MG  --   --  1.5*   GFR: Estimated Creatinine Clearance: 35.3 mL/min (A) (by C-G formula based on SCr of 2.79 mg/dL (H)). Liver Function Tests: Recent Labs  Lab 07/09/19 1854  AST 24  ALT 15  ALKPHOS 26*  BILITOT 1.3*  PROT 8.1  ALBUMIN 2.8*   No results for input(s): LIPASE, AMYLASE in the last 168 hours. No results for input(s): AMMONIA in the last 168 hours. Coagulation Profile:  Recent Labs  Lab 07/08/19 1512 07/08/19 1752 07/09/19 1854 07/10/19 0516 07/10/19 1349  INR 8.0* >10.0* 8.3* 7.6* 5.0*   Cardiac Enzymes: No results for input(s): CKTOTAL, CKMB, CKMBINDEX, TROPONINI in the last 168 hours. BNP (last 3 results) No results for input(s): PROBNP in the last 8760 hours. HbA1C: No results for input(s): HGBA1C in the last 72 hours. CBG: Recent Labs  Lab 07/09/19 1902 07/09/19 2334 07/10/19 0804  GLUCAP 362* 243* 116*   Lipid Profile: No results for input(s): CHOL, HDL, LDLCALC, TRIG, CHOLHDL, LDLDIRECT in the last 72 hours. Thyroid Function Tests: No results for input(s): TSH, T4TOTAL, FREET4, T3FREE, THYROIDAB in the last 72 hours. Anemia Panel: No results for input(s): VITAMINB12, FOLATE, FERRITIN, TIBC, IRON, RETICCTPCT in the last 72 hours. Urine analysis:  Sepsis Labs: @LABRCNTIP (procalcitonin:4,lacticidven:4) ) Recent Results (from the past 240 hour(s))  SARS Coronavirus 2 [facility] order, Performed in [facility] hospital lab) Nasopharyngeal Nasopharyngeal Swab     Status: None   Collection Time: 07/09/19  8:26 PM   Specimen: Nasopharyngeal Swab  Result Value Ref Range Status   SARS Coronavirus 2 NEGATIVE NEGATIVE Final    Comment: (NOTE) If result is NEGATIVE SARS-CoV-2 target nucleic acids are NOT DETECTED. The SARS-CoV-2 RNA is generally detectable in upper and lower  respiratory specimens during the acute phase of infection. The lowest  concentration of SARS-CoV-2 viral copies this assay can detect is 250  copies / mL. A negative result does not preclude SARS-CoV-2 infection  and should not be used as the sole basis for treatment or other  patient management decisions.  A negative result may occur with  improper specimen collection / handling, submission of specimen other  than nasopharyngeal swab, presence of viral mutation(s) within the  areas targeted by this assay, and inadequate number of viral copies  (<250 copies / mL).  A negative result must be combined with clinical  observations, patient history, and epidemiological information. If result is POSITIVE SARS-CoV-2 target nucleic acids are DETECTED. The SARS-CoV-2 RNA is generally detectable in upper and lower  respiratory specimens dur ing the acute phase of infection.  Positive  results are indicative of active infection with SARS-CoV-2.  Clinical  correlation with patient history and other diagnostic information is  necessary to determine patient infection status.  Positive results do  not rule out bacterial infection or co-infection with other viruses. If result is PRESUMPTIVE POSTIVE SARS-CoV-2 nucleic acids MAY BE PRESENT.   A presumptive positive result was obtained on the submitted specimen  and confirmed on repeat testing.  While 2019 novel  coronavirus  (SARS-CoV-2) nucleic acids may be present in the submitted sample  additional confirmatory testing may be necessary for epidemiological  and / or clinical management purposes  to differentiate between  SARS-CoV-2 and other Sarbecovirus currently known to infect humans.  If clinically indicated additional testing with an alternate test  methodology [id number]) is advised. The SARS-CoV-2 RNA is generally  detectable in upper and lower respiratory sp ecimens during the acute  phase of infection. The expected result is Negative. Fact Sheet for Patients:  StrictlyIdeas.no Fact Sheet for Healthcare Providers: BankingDealers.co.za This test is not yet approved or cleared by the [place] FDA and has been authorized for detection and/or diagnosis of SARS-CoV-2 by FDA under an Emergency Use Authorization (EUA).  This EUA will remain in effect (meaning this test can be used) for the duration of the COVID-19 declaration under Section 564(b)(1) of the Act, 21 U.S.C. section 360bbb-3(b)(1), unless the authorization is terminated or revoked sooner.  Performed at [facility], [place] [address], [place], [place] [address]          Radiology Studies: Ct Head Wo Contrast  Result Date: 07/09/2019 CLINICAL DATA:  Blood draining from right ear yesterday. The patient takes Coumadin. Elevated INR. EXAM: CT HEAD WITHOUT CONTRAST TECHNIQUE: Contiguous axial images were obtained from the base of the skull through the vertex without intravenous contrast. COMPARISON:  None. FINDINGS: Brain: 3 mm hypodensity in the left lentiform nucleus on image 18/3, possibly a remote small lacunar infarct or dilated perivascular space. Periventricular white matter and corona radiata hypodensities favor chronic ischemic microvascular white matter disease. Otherwise, the brainstem, cerebellum, cerebral peduncles, thalamus, basal ganglia, basilar cisterns, and ventricular system appear within normal limits. No intracranial hemorrhage, mass lesion, or acute CVA. Vascular: There is atherosclerotic calcification of the cavernous carotid arteries bilaterally. Skull: Unremarkable Sinuses/Orbits: Minimal mucosal thickening in the ethmoid air cells compatible with mild chronic sinusitis. Material in the right external auditory canal could represent blood clot or cerumen. Other: No supplemental non-categorized findings. IMPRESSION: 1. No acute intracranial findings. 2. 3 mm in diameter dilated perivascular space or tiny remote lacunar infarct in the left lentiform nucleus. 3. Periventricular white matter and corona radiata hypodensities favor chronic ischemic microvascular white matter disease. 4. Material in the right external auditory canal could represent blood clot or cerumen. Electronically Signed   By: Van Clines M.D.   On: 07/09/2019 20:08      Scheduled Meds: . carvedilol  25 mg Oral BID WC  . colchicine  0.6 mg Oral Daily  . cyclobenzaprine  5 mg Oral BID  . gabapentin  300 mg Oral TID  . insulin aspart  0-9 Units Subcutaneous TID WC  . insulin aspart  3  Units Subcutaneous TID WC  . insulin NPH Human  10-20 Units Subcutaneous BID AC  . rosuvastatin  10 mg Oral Daily   Continuous Infusions: . magnesium sulfate bolus IVPB       LOS: 0 days      Debbe Odea, MD [facility] Pager: [email or web address] Password [email or web address] 07/10/2019, 3:52 PM

## 2019-07-11 DIAGNOSIS — E860 Dehydration: Secondary | ICD-10-CM

## 2019-07-11 DIAGNOSIS — R791 Abnormal coagulation profile: Secondary | ICD-10-CM

## 2019-07-11 LAB — GLUCOSE, CAPILLARY
Glucose-Capillary: 317 mg/dL — ABNORMAL HIGH (ref 70–99)
Glucose-Capillary: 355 mg/dL — ABNORMAL HIGH (ref 70–99)
Glucose-Capillary: 315 mg/dL — ABNORMAL HIGH (ref 70–99)
Glucose-Capillary: 194 mg/dL — ABNORMAL HIGH (ref 70–99)

## 2019-07-11 LAB — BASIC METABOLIC PANEL
Anion gap: 16 — ABNORMAL HIGH (ref 5–15)
Anion gap: 23 — ABNORMAL HIGH (ref 5–15)
Anion gap: 13 (ref 5–15)
BUN: 75 mg/dL — ABNORMAL HIGH (ref 8–23)
BUN: 78 mg/dL — ABNORMAL HIGH (ref 8–23)
BUN: 77 mg/dL — ABNORMAL HIGH (ref 8–23)
CO2: 34 mmol/L — ABNORMAL HIGH (ref 22–32)
CO2: 22 mmol/L (ref 22–32)
CO2: 33 mmol/L — ABNORMAL HIGH (ref 22–32)
Calcium: 8.9 mg/dL (ref 8.9–10.3)
Calcium: 8.5 mg/dL — ABNORMAL LOW (ref 8.9–10.3)
Calcium: 8.6 mg/dL — ABNORMAL LOW (ref 8.9–10.3)
Chloride: 85 mmol/L — ABNORMAL LOW (ref 98–111)
Chloride: 86 mmol/L — ABNORMAL LOW (ref 98–111)
Chloride: 88 mmol/L — ABNORMAL LOW (ref 98–111)
Creatinine, Ser: 2.55 mg/dL — ABNORMAL HIGH (ref 0.61–1.24)
Creatinine, Ser: 2.53 mg/dL — ABNORMAL HIGH (ref 0.61–1.24)
Creatinine, Ser: 2.63 mg/dL — ABNORMAL HIGH (ref 0.61–1.24)
GFR calc Af Amer: 29 mL/min — ABNORMAL LOW (ref >60)
GFR calc Af Amer: 29 mL/min — ABNORMAL LOW (ref >60)
GFR calc Af Amer: 28 mL/min — ABNORMAL LOW (ref >60)
GFR calc non Af Amer: 25 mL/min — ABNORMAL LOW (ref >60)
GFR calc non Af Amer: 25 mL/min — ABNORMAL LOW (ref >60)
GFR calc non Af Amer: 24 mL/min — ABNORMAL LOW (ref >60)
Glucose, Bld: 251 mg/dL — ABNORMAL HIGH (ref 70–99)
Glucose, Bld: 290 mg/dL — ABNORMAL HIGH (ref 70–99)
Glucose, Bld: 194 mg/dL — ABNORMAL HIGH (ref 70–99)
Potassium: 2.9 mmol/L — ABNORMAL LOW (ref 3.5–5.1)
Potassium: 4.8 mmol/L (ref 3.5–5.1)
Potassium: 2.8 mmol/L — ABNORMAL LOW (ref 3.5–5.1)
Sodium: 135 mmol/L (ref 135–145)
Sodium: 131 mmol/L — ABNORMAL LOW (ref 135–145)
Sodium: 134 mmol/L — ABNORMAL LOW (ref 135–145)

## 2019-07-11 LAB — MAGNESIUM: Magnesium: 2.1 mg/dL (ref 1.7–2.4)

## 2019-07-11 LAB — CBC
HCT: 46.9 % (ref 39.0–52.0)
Hemoglobin: 15.5 g/dL (ref 13.0–17.0)
MCH: 31.8 pg (ref 26.0–34.0)
MCHC: 33 g/dL (ref 30.0–36.0)
MCV: 96.1 fL (ref 80.0–100.0)
Platelets: 260 10*3/uL (ref 150–400)
RBC: 4.88 MIL/uL (ref 4.22–5.81)
RDW: 13.6 % (ref 11.5–15.5)
WBC: 9.8 10*3/uL (ref 4.0–10.5)
nRBC: 0 % (ref 0.0–0.2)

## 2019-07-11 LAB — PROTIME-INR
INR: 3.9 — ABNORMAL HIGH (ref 0.8–1.2)
Prothrombin Time: 37.9 seconds — ABNORMAL HIGH (ref 11.4–15.2)

## 2019-07-11 MED ORDER — POTASSIUM CHLORIDE CRYS ER 20 MEQ PO TBCR
40.0000 meq | EXTENDED_RELEASE_TABLET | ORAL | Status: AC
  Administered 2019-07-11 (×2): 40 meq via ORAL
  Filled 2019-07-11 (×2): qty 2

## 2019-07-11 MED ORDER — SODIUM CHLORIDE 0.9 % IV BOLUS
250.0000 mL | Freq: Once | INTRAVENOUS | Status: AC
  Administered 2019-07-11: 11:00:00 250 mL via INTRAVENOUS

## 2019-07-11 MED ORDER — POTASSIUM CHLORIDE CRYS ER 20 MEQ PO TBCR: 40.0000 meq | EXTENDED_RELEASE_TABLET | Freq: Every day | ORAL | 0 refills | Status: DC

## 2019-07-11 MED ORDER — INSULIN NPH (HUMAN) (ISOPHANE) 100 UNIT/ML ~~LOC~~ SUSP
20.0000 [IU] | Freq: Two times a day (BID) | SUBCUTANEOUS | Status: DC
  Administered 2019-07-12 – 2019-07-13 (×4): 20 [IU] via SUBCUTANEOUS
  Filled 2019-07-11: qty 10

## 2019-07-11 NOTE — Plan of Care (Signed)

## 2019-07-12 ENCOUNTER — Telehealth: Payer: Self-pay

## 2019-07-12 LAB — GLUCOSE, CAPILLARY
Glucose-Capillary: 200 mg/dL — ABNORMAL HIGH (ref 70–99)
Glucose-Capillary: 88 mg/dL (ref 70–99)
Glucose-Capillary: 160 mg/dL — ABNORMAL HIGH (ref 70–99)
Glucose-Capillary: 324 mg/dL — ABNORMAL HIGH (ref 70–99)
Glucose-Capillary: 376 mg/dL — ABNORMAL HIGH (ref 70–99)

## 2019-07-12 LAB — BASIC METABOLIC PANEL
Anion gap: 15 (ref 5–15)
BUN: 71 mg/dL — ABNORMAL HIGH (ref 8–23)
CO2: 32 mmol/L (ref 22–32)
Calcium: 9.4 mg/dL (ref 8.9–10.3)
Chloride: 89 mmol/L — ABNORMAL LOW (ref 98–111)
Creatinine, Ser: 2.46 mg/dL — ABNORMAL HIGH (ref 0.61–1.24)
GFR calc Af Amer: 30 mL/min — ABNORMAL LOW (ref >60)
GFR calc non Af Amer: 26 mL/min — ABNORMAL LOW (ref >60)
Glucose, Bld: 92 mg/dL (ref 70–99)
Potassium: 2.5 mmol/L — CL (ref 3.5–5.1)
Sodium: 136 mmol/L (ref 135–145)

## 2019-07-12 LAB — PROTIME-INR
INR: 3 — ABNORMAL HIGH (ref 0.8–1.2)
Prothrombin Time: 30.7 seconds — ABNORMAL HIGH (ref 11.4–15.2)

## 2019-07-12 LAB — MAGNESIUM: Magnesium: 1.8 mg/dL (ref 1.7–2.4)

## 2019-07-12 LAB — MRSA PCR SCREENING: MRSA by PCR: POSITIVE — AB

## 2019-07-12 MED ORDER — POTASSIUM CHLORIDE CRYS ER 20 MEQ PO TBCR
40.0000 meq | EXTENDED_RELEASE_TABLET | ORAL | Status: AC
  Administered 2019-07-12 (×2): 40 meq via ORAL
  Filled 2019-07-12 (×2): qty 2

## 2019-07-12 MED ORDER — INSULIN ASPART 100 UNIT/ML ~~LOC~~ SOLN
4.0000 [IU] | Freq: Once | SUBCUTANEOUS | Status: AC
  Administered 2019-07-12: 23:00:00 4 [IU] via SUBCUTANEOUS

## 2019-07-12 MED ORDER — POTASSIUM CHLORIDE CRYS ER 20 MEQ PO TBCR: 40.0000 meq | EXTENDED_RELEASE_TABLET | ORAL | Status: DC

## 2019-07-12 MED ORDER — POTASSIUM CHLORIDE CRYS ER 20 MEQ PO TBCR
40.0000 meq | EXTENDED_RELEASE_TABLET | Freq: Four times a day (QID) | ORAL | Status: AC
  Administered 2019-07-12 (×2): 40 meq via ORAL
  Filled 2019-07-12 (×2): qty 2

## 2019-07-12 NOTE — Plan of Care (Signed)

## 2019-07-12 NOTE — Progress Notes (Signed)
CRITICAL VALUE ALERT  Critical Value:  Potassium 2.5  Date & Time Notied:  07/12/2019 @ 0619  Provider Notified: Bodenheimer, NP @ [phone]  Orders Received/Actions taken: 40 mEq of potassium chloride q4h

## 2019-07-12 NOTE — Telephone Encounter (Signed)
Copied from [place] [id number]. Topic: General - Other >> Jul 12, 2019  9:11 AM Leward Quan A wrote: Reason for CRM: Patient wife called to inform Dr Ethelene Hal that he was admitted into the hospital due to issues he was having on 07/09/2019

## 2019-07-12 NOTE — Consult Note (Addendum)
   [facility] CM Inpatient Consult   07/12/2019  Kurt Cross [birth year] [id number]  07/13/2019 1230 pm:  Was able to speak with the patient from the hospital phone, HIPAA verified.  Patient in agreement for ongoing [facility] for post hospital follow up.  He gives permission for the [facility] staff to speak with his wife also, Kurt Cross.  07/12/2019: Patient is showing as active with [facility] for chronic disease management services.  Patient has been engaged by a [facility] Coordinator.  Plan: Reach out to [facility] Coordinator for updates of needs. Also called the patient's hospital room and no answer at this time.  Will try another tiime. Follow patient progress and needs and with Inpatient [facility] team member to make aware that [facility] following.   Of note, [facility] Care Management services does not replace or interfere with any services that are needed or arranged by inpatient [facility] care management team.  For additional questions or referrals please contact:  Natividad Brood, RN BSN [facility] Liaison  [phone] business mobile phone Toll free office [phone]  Fax number: [phone] [email or web address]

## 2019-07-12 NOTE — Progress Notes (Signed)
PROGRESS NOTE    Kurt Cross  [id number] DOB: 1951-04-27 DOA: 07/09/2019 PCP: [name], MD   Brief Narrative:  Kurt Cross is a 68 y/o male with A-fib on Coumadin, chronic combined CHF, CKD 3, DM2, HTN whose caretaker noted blood draining from his right ear a day ago.  He went to Coumadin clinic and his INR was found to be 10.2 on 07/08/19.The patient had received Vit K 2.5 mg to reverse coumadin and his Coumadin was supposed to be held for 4 days. After being given Vit K, he had another episode of bleeding about an hour and a half later from the ear and then came to the ED. In ED> Right ear canal noted to have dried blood and INR in ED 8.3, K 2.3.  Admitted for further management.  Assessment & Plan:   Principal Problem:   Hypokalemia Active Problems:   Essential hypertension   Chronic combined systolic and diastolic CHF (congestive heart failure) (HCC)   Atrial fibrillation/flutter   S/P placement of cardiac pacemaker   CKD (chronic kidney disease) stage 4, GFR 15-29 ml/min (HCC)   Type 2 diabetes mellitus with stage 3b chronic kidney disease, with long-term current use of insulin (HCC)   Coumadin toxicity   Melena   Elevated INR   Dehydration  Coumadin toxicity/bleeding from right ear: He has not had any further bleeding since admission.  He is Coumadin has been on hold.  His INR yesterday was 3.8.  No INR today.  Will repeat today.  No other complaint at this point in time.  Persistent hypokalemia and hypomagnesemia: His potassium is low again at 2.5.  Magnesium will be checked again today.  Will replace him orally and recheck in the morning.  Fever/leukocytosis: He had low-grade temperature of 100.1 yesterday.  Remains afebrile since then.  He had leukocytosis of 14.5 yesterday.  Leukocytosis resolved today.  COVID-19 negative.  No other signs or symptoms suggestive of any infection.  Chronic combined systolic and diastolic congestive heart failure: Euvolemic.   Continue current management.  Chronic atrial fibrillation/flutter: Status post pacemaker.  Coumadin on hold.  CKD stage III: At baseline.  Continue to watch.  Type 2 diabetes mellitus: Interestingly, he was hypoglycemic yesterday but this morning his blood sugar was low normal.  He is on insulin NPH 20 units twice daily as well as SSI and I will continue that.  DVT prophylaxis: Supratherapeutic INR Code Status: The Family Communication:  None present at bedside.  Plan of care discussed with patient in length and he verbalized understanding and agreed with it. Disposition Plan: Potential discharge tomorrow  Consultants:   None  Procedures:   None  Antimicrobials:   None   Subjective: Patient seen and examined.  He has no complaint.  No further bleeding from his ear.  His hearing is intact.  Objective: Vitals:   07/11/19 1351 07/11/19 1548 07/11/19 2019 07/12/19 0430  BP: 129/73 119/71 105/72 118/76  Pulse: 70 70 69 70  Resp:  17    Temp:  98.4 F (36.9 C) 98.9 F (37.2 C) 99 F (37.2 C)  TempSrc:  Oral Oral Oral  SpO2:  96% 92% 93%  Weight:      Height:        Intake/Output Summary (Last 24 hours) at 07/12/2019 1117 Last data filed at 07/11/2019 1549 Gross per 24 hour  Intake 275 ml  Output 500 ml  Net -225 ml   Filed Weights   07/09/19 1858  07/10/19 1453  Weight: 127 kg 126.2 kg    Examination:  General exam: Appears calm and comfortable, obese Respiratory system: Diminished breath sounds at the bases. Respiratory effort normal. Cardiovascular system: S1 & S2 heard, RRR. No JVD, murmurs, rubs, gallops or clicks. No pedal edema. Gastrointestinal system: Abdomen is nondistended, soft and nontender. No organomegaly or masses felt. Normal bowel sounds heard. Central nervous system: Alert and oriented. No focal neurological deficits. Extremities: Symmetric 5 x 5 power. Skin: No rashes, lesions or ulcers Psychiatry: Judgement and insight appear normal. Mood  & affect appropriate.    Data Reviewed: I have personally reviewed following labs and imaging studies  CBC: Recent Labs  Lab 07/09/19 1854 07/09/19 1918 07/11/19 0357  WBC 14.5*  --  9.8  NEUTROABS 12.9*  --   --   HGB 17.1* 18.7* 15.5  HCT 49.4 55.0* 46.9  MCV 93.6  --  96.1  PLT 274  --  123456   Basic Metabolic Panel: Recent Labs  Lab 07/10/19 0516 07/11/19 0357 07/11/19 1456 07/11/19 2135 07/12/19 0505  NA 135 135 131* 134* 136  K 2.3* 2.9* 4.8 2.8* 2.5*  CL 87* 85* 86* 88* 89*  CO2 31 34* 22 33* 32  GLUCOSE 153* 251* 290* 194* 92  BUN 76* 75* 78* 77* 71*  CREATININE 2.79* 2.55* 2.53* 2.63* 2.46*  CALCIUM 8.8* 8.9 8.5* 8.6* 9.4  MG 1.5* 2.1  --   --   --    GFR: Estimated Creatinine Clearance: 40 mL/min (A) (by C-G formula based on SCr of 2.46 mg/dL (H)). Liver Function Tests: Recent Labs  Lab 07/09/19 1854  AST 24  ALT 15  ALKPHOS 26*  BILITOT 1.3*  PROT 8.1  ALBUMIN 2.8*   No results for input(s): LIPASE, AMYLASE in the last 168 hours. No results for input(s): AMMONIA in the last 168 hours. Coagulation Profile: Recent Labs  Lab 07/08/19 1752 07/09/19 1854 07/10/19 0516 07/10/19 1349 07/11/19 0805  INR >10.0* 8.3* 7.6* 5.0* 3.9*   Cardiac Enzymes: No results for input(s): CKTOTAL, CKMB, CKMBINDEX, TROPONINI in the last 168 hours. BNP (last 3 results) No results for input(s): PROBNP in the last 8760 hours. HbA1C: No results for input(s): HGBA1C in the last 72 hours. CBG: Recent Labs  Lab 07/11/19 0814 07/11/19 1237 07/11/19 1722 07/11/19 2110 07/12/19 0823  GLUCAP 317* 355* 315* 194* 88   Lipid Profile: No results for input(s): CHOL, HDL, LDLCALC, TRIG, CHOLHDL, LDLDIRECT in the last 72 hours. Thyroid Function Tests: No results for input(s): TSH, T4TOTAL, FREET4, T3FREE, THYROIDAB in the last 72 hours. Anemia Panel: No results for input(s): VITAMINB12, FOLATE, FERRITIN, TIBC, IRON, RETICCTPCT in the last 72 hours. Sepsis Labs: No  results for input(s): PROCALCITON, LATICACIDVEN in the last 168 hours.  Recent Results (from the past 240 hour(s))  SARS Coronavirus 2 [facility] order, Performed in [facility] hospital lab) Nasopharyngeal Nasopharyngeal Swab     Status: None   Collection Time: 07/09/19  8:26 PM   Specimen: Nasopharyngeal Swab  Result Value Ref Range Status   SARS Coronavirus 2 NEGATIVE NEGATIVE Final    Comment: (NOTE) If result is NEGATIVE SARS-CoV-2 target nucleic acids are NOT DETECTED. The SARS-CoV-2 RNA is generally detectable in upper and lower  respiratory specimens during the acute phase of infection. The lowest  concentration of SARS-CoV-2 viral copies this assay can detect is 250  copies / mL. A negative result does not preclude SARS-CoV-2 infection  and should not be used as the  sole basis for treatment or other  patient management decisions.  A negative result may occur with  improper specimen collection / handling, submission of specimen other  than nasopharyngeal swab, presence of viral mutation(s) within the  areas targeted by this assay, and inadequate number of viral copies  (<250 copies / mL). A negative result must be combined with clinical  observations, patient history, and epidemiological information. If result is POSITIVE SARS-CoV-2 target nucleic acids are DETECTED. The SARS-CoV-2 RNA is generally detectable in upper and lower  respiratory specimens dur ing the acute phase of infection.  Positive  results are indicative of active infection with SARS-CoV-2.  Clinical  correlation with patient history and other diagnostic information is  necessary to determine patient infection status.  Positive results do  not rule out bacterial infection or co-infection with other viruses. If result is PRESUMPTIVE POSTIVE SARS-CoV-2 nucleic acids MAY BE PRESENT.   A presumptive positive result was obtained on the submitted specimen  and confirmed on repeat testing.  While 2019 novel  coronavirus  (SARS-CoV-2) nucleic acids may be present in the submitted sample  additional confirmatory testing may be necessary for epidemiological  and / or clinical management purposes  to differentiate between  SARS-CoV-2 and other Sarbecovirus currently known to infect humans.  If clinically indicated additional testing with an alternate test  methodology [phone]) is advised. The SARS-CoV-2 RNA is generally  detectable in upper and lower respiratory sp ecimens during the acute  phase of infection. The expected result is Negative. Fact Sheet for Patients:  StrictlyIdeas.no Fact Sheet for Healthcare Providers: BankingDealers.co.za This test is not yet approved or cleared by the [place] FDA and has been authorized for detection and/or diagnosis of SARS-CoV-2 by FDA under an Emergency Use Authorization (EUA).  This EUA will remain in effect (meaning this test can be used) for the duration of the COVID-19 declaration under Section 564(b)(1) of the Act, 21 U.S.C. section 360bbb-3(b)(1), unless the authorization is terminated or revoked sooner. Performed at [facility], [place] [address], [place], [place] [address]       Radiology Studies: No results found.  Scheduled Meds:  colchicine  0.6 mg Oral Daily   gabapentin  300 mg Oral TID   insulin aspart  0-9 Units Subcutaneous TID WC   insulin aspart  3 Units Subcutaneous TID WC   insulin NPH Human  20 Units Subcutaneous BID AC   potassium chloride  40 mEq Oral Q6H   rosuvastatin  10 mg Oral Daily   Continuous Infusions:   LOS: 2 days   Time spent: 35 minutes   Darliss Cheney, MD [facility]  07/12/2019, 11:17 AM   To contact the attending provider between 7A-7P or the covering provider during after hours 7P-7A, please log into the web site [email or web address] and use password [email or web address].

## 2019-07-13 ENCOUNTER — Other Ambulatory Visit: Payer: Self-pay

## 2019-07-13 LAB — BASIC METABOLIC PANEL
Anion gap: 15 (ref 5–15)
BUN: 67 mg/dL — ABNORMAL HIGH (ref 8–23)
CO2: 28 mmol/L (ref 22–32)
Calcium: 8.6 mg/dL — ABNORMAL LOW (ref 8.9–10.3)
Chloride: 91 mmol/L — ABNORMAL LOW (ref 98–111)
Creatinine, Ser: 2.4 mg/dL — ABNORMAL HIGH (ref 0.61–1.24)
GFR calc Af Amer: 31 mL/min — ABNORMAL LOW (ref >60)
GFR calc non Af Amer: 27 mL/min — ABNORMAL LOW (ref >60)
Glucose, Bld: 349 mg/dL — ABNORMAL HIGH (ref 70–99)
Potassium: 3.3 mmol/L — ABNORMAL LOW (ref 3.5–5.1)
Sodium: 134 mmol/L — ABNORMAL LOW (ref 135–145)

## 2019-07-13 LAB — CBC
HCT: 43.9 % (ref 39.0–52.0)
Hemoglobin: 14.4 g/dL (ref 13.0–17.0)
MCH: 31.5 pg (ref 26.0–34.0)
MCHC: 32.8 g/dL (ref 30.0–36.0)
MCV: 96.1 fL (ref 80.0–100.0)
Platelets: 252 10*3/uL (ref 150–400)
RBC: 4.57 MIL/uL (ref 4.22–5.81)
RDW: 13.6 % (ref 11.5–15.5)
WBC: 7.7 10*3/uL (ref 4.0–10.5)
nRBC: 0 % (ref 0.0–0.2)

## 2019-07-13 LAB — GLUCOSE, CAPILLARY
Glucose-Capillary: 304 mg/dL — ABNORMAL HIGH (ref 70–99)
Glucose-Capillary: 224 mg/dL — ABNORMAL HIGH (ref 70–99)
Glucose-Capillary: 324 mg/dL — ABNORMAL HIGH (ref 70–99)

## 2019-07-13 LAB — PROTIME-INR
INR: 2.6 — ABNORMAL HIGH (ref 0.8–1.2)
Prothrombin Time: 27.5 seconds — ABNORMAL HIGH (ref 11.4–15.2)

## 2019-07-13 LAB — MAGNESIUM: Magnesium: 1.7 mg/dL (ref 1.7–2.4)

## 2019-07-13 MED ORDER — MUPIROCIN 2 % EX OINT
1.0000 "application " | TOPICAL_OINTMENT | Freq: Two times a day (BID) | CUTANEOUS | Status: DC
  Administered 2019-07-13 (×2): 1 via NASAL
  Filled 2019-07-13: qty 22

## 2019-07-13 MED ORDER — POTASSIUM CHLORIDE CRYS ER 20 MEQ PO TBCR
40.0000 meq | EXTENDED_RELEASE_TABLET | ORAL | Status: AC
  Administered 2019-07-13 (×2): 40 meq via ORAL
  Filled 2019-07-13 (×2): qty 2

## 2019-07-13 MED ORDER — POTASSIUM CHLORIDE CRYS ER 20 MEQ PO TBCR: 20.0000 meq | EXTENDED_RELEASE_TABLET | Freq: Every day | ORAL | 0 refills | Status: DC

## 2019-07-13 MED ORDER — CHLORHEXIDINE GLUCONATE CLOTH 2 % EX PADS
6.0000 | MEDICATED_PAD | Freq: Every day | CUTANEOUS | Status: DC
  Administered 2019-07-13: 06:00:00 6 via TOPICAL

## 2019-07-13 NOTE — TOC Initial Note (Addendum)
Transition of Care [facility]) - Initial/Assessment Note    Patient Details  Name: Kurt Cross MRN: [id number] Date of Birth: 02/15/1951  Transition of Care [facility]) CM/SW Contact:    Marilu Favre, RN Phone Number: 07/13/2019, 12:15 PM  Clinical Narrative:                  Spoke to patient at bedside and wife susan via phone with patient;s permission. Confirmed face sheet information. Patient from home with wife and [facility].   Spoke to Bylas with [facility] , she will need orders for [facility] SW. Messaged MD.   Patient has a wheel chair at home that he states the leg is broken on and a hospital bed with the mattress is cracked. Him and his wife stated they have called [employer] but are "getting no where". NCM called [employer] spoke with Zack. He will enter a "switch out ticket", someone from [place] will go to patient's home and exchange DME. Patient aware.  PTAR paperwork done . Nurse will call when ready for DC. Asked MD for DNR form. Expected Discharge Plan: [place] Barriers to Discharge: No Barriers Identified   Patient Goals and CMS Choice Patient states their goals for this hospitalization and ongoing recovery are:: to go home CMS Medicare.gov Compare Post Acute Care list provided to:: Patient Choice offered to / list presented to : Patient  Expected Discharge Plan and Services Expected Discharge Plan: [place]   Discharge Planning Services: CM Consult Post Acute Care Choice: [place] arrangements for the past 2 months: Single Family Home Expected Discharge Date: 07/13/19               DME Arranged: N/A         HH Arranged: RN, PT, OT, Nurse's Aide, Social Work [employer] Agency: [facility] Date [place] Agency Contacted: 07/13/19 Time [place]: 1215 Representative spoke with at [place]: Dorian Pod  Prior Living Arrangements/Services Living arrangements for the past 2 months: [place] Lives with::  Spouse Patient language and need for interpreter reviewed:: Yes Do you feel safe going back to the place where you live?: Yes      Need for Family Participation in Patient Care: Yes (Comment) Care giver support system in place?: Yes (comment) Current home services: DME, Home OT, Home PT, Home RN, Homehealth aide Criminal Activity/Legal Involvement Pertinent to Current Situation/Hospitalization: No - Comment as needed  Activities of Daily Living      Permission Sought/Granted   Permission granted to share information with : Yes, Verbal Permission Granted  Share Information with NAME: [name] wife           Emotional Assessment Appearance:: Appears stated age Attitude/Demeanor/Rapport: Engaged Affect (typically observed): Accepting Orientation: : Oriented to Self, Oriented to Place, Oriented to  Time, Oriented to Situation Alcohol / Substance Use: Not Applicable Psych Involvement: No (comment)  Admission diagnosis:  Hypokalemia [E87.6] Elevated INR [R79.1] Melena [K92.1] Patient Active Problem List   Diagnosis Date Noted  . Elevated INR   . Dehydration   . Melena 07/10/2019  . Coumadin toxicity 07/09/2019  . Hypokalemia 07/09/2019  . Type 2 diabetes mellitus with stage 3b chronic kidney disease, with long-term current use of insulin ([place]) 07/08/2019  . Encounter for therapeutic drug monitoring 03/17/2019  . Gouty arthritis 03/12/2019  . On anticoagulant therapy 02/07/2019  . Dependence on wheelchair 02/07/2019  . Pneumatosis of gastric wall 02/05/2019  . Nausea &  vomiting 02/05/2019  . Acute exacerbation of CHF (congestive heart failure) ([place]) 02/03/2019  . Diabetic ulcer of ankle associated with type 2 diabetes mellitus, limited to breakdown of skin ([place]) 12/03/2018  . CKD (chronic kidney disease) stage 4, GFR 15-29 ml/min (HCC) 02/17/2018  . S/P placement of cardiac pacemaker 02/22/2016  . Atrial flutter ([place]) 02/22/2016  . Chronic anticoagulation 02/22/2016  . H/O  medication noncompliance 02/22/2016  . Debility 02/10/2016  . Constipation 02/07/2016  . Acute on chronic combined systolic and diastolic CHF, NYHA class 2 ([place]) 01/27/2016  . Acute on chronic renal failure ([place]) 01/27/2016  . Acute on chronic respiratory failure with hypoxia ([place]) 01/27/2016  . Hypoxia 01/26/2016  . Atrial fibrillation/flutter 10/06/2013  . Recurrent ventral incisional hernia 08/14/2013  . Thoracic aortic aneurysm ([place]) 08/12/2013  . Right leg pain 08/11/2013  . Chronic combined systolic and diastolic CHF (congestive heart failure) ([place]) 08/04/2013  . Essential hypertension 06/28/2012  . Diabetes mellitus, insulin dependent (IDDM), uncontrolled 09/01/2007  . Hyperlipidemia 09/01/2007  . Obesity, Class III, BMI 40-49.9 (morbid obesity) ([place]) 09/01/2007  . Obstructive sleep apnea 09/01/2007   PCP:  [name], MD Pharmacy:   [facility] [place]), [place] - [id number] [place]) [place] [address] Phone: [phone] Fax: [phone]  [facility] - [place], [place] [address] Phone: [phone] Fax: [phone]  [id number] - [place], [place] - [employer] [employer] [id number]  [place] [id number] Phone: [phone] Fax: [phone]     Social Determinants of Health (SDOH) Interventions    Readmission Risk Interventions No flowsheet data found.

## 2019-07-13 NOTE — Care Management Important Message (Signed)
Important Message  Patient Details  Name: BETZALEL DOHNER MRN: [id number] Date of Birth: 04/21/1951   Medicare Important Message Given:  Yes     Shelda Altes 07/13/2019, 4:55 PM

## 2019-07-13 NOTE — Discharge Summary (Signed)
Physician Discharge Summary  Kurt Cross [id number] DOB: 29-Jun-1951 DOA: 07/09/2019  PCP: [name], MD  Admit date: 07/09/2019 Discharge date: 07/13/2019  Admitted From: Home Disposition: Home  Recommendations for Outpatient Follow-up:  1. Follow up with PCP in 1-2 weeks 2. Please get your INR checked in 3 to 5 days. 3. Please obtain BMP/CBC in one week 4. Please follow up on the following pending results:  Home Health: He has Equipment/Devices: None  Discharge Condition: Stable CODE STATUS: Full code Diet recommendation: Cardiac  Subjective: Seen and examined.  He has no complaints.  No further bleeding from his ear.  Hearing is intact.  Brief/Interim Summary: Kurt Cross a 68 y/o male with A-fib on Coumadin, chronic combined CHF, CKD 3, DM2, HTN whose caretaker noted blood draining from his right ear a day ago.  He went to Coumadin clinic and his INR was found to be 10.2 on 07/08/19.The patient had received Vit K 2.5 mg to reverse coumadin and his Coumadin was supposed to be held for 4 days. After being given Vit K, he had another episode of bleeding about an hour and a half later from the ear and then came to the ED. In ED> Right ear canal noted to have dried blood and INR in ED 8.3, K 2.3.  CT head was done which was unremarkable except some material in the right external auditory canal which could represent blood clot or cerumen.  Patient was also hypokalemic.  Patient was admitted for further management.  His Coumadin was held.  He did not have any further bleeding from his ear.  His hearing remained intact.  Over the course of past 2 days, his INR has come down all the way to 2.6 today.  He continued to have persistent severe hypokalemia and was replaced orally.  Currently his potassium is 3.2.  He is going to get a couple more doses today orally and then will be discharged home in stable condition.  He is being prescribed 20 mEq of potassium to take p.o. daily for  7 days.  I have recommended him to see PCP within 7 days to check potassium and he needs to get his INR checked in 3 to 5 days.  Discharge Diagnoses:  Principal Problem:   Hypokalemia Active Problems:   Essential hypertension   Chronic combined systolic and diastolic CHF (congestive heart failure) (HCC)   Atrial fibrillation/flutter   S/P placement of cardiac pacemaker   CKD (chronic kidney disease) stage 4, GFR 15-29 ml/min (HCC)   Type 2 diabetes mellitus with stage 3b chronic kidney disease, with long-term current use of insulin (HCC)   Coumadin toxicity   Melena   Elevated INR   Dehydration    Discharge Instructions  Discharge Instructions    (HEART FAILURE PATIENTS) Call MD:  Anytime you have any of the following symptoms: 1) 3 pound weight gain in 24 hours or 5 pounds in 1 week 2) shortness of breath, with or without a dry hacking cough 3) swelling in the hands, feet or stomach 4) if you have to sleep on extra pillows at night in order to breathe.   Complete by: As directed    Diet - low sodium heart healthy   Complete by: As directed    Discharge instructions   Complete by: As directed    Please have your INR checked tomorrow and have your Mg+ and Bmet (blood work in 5 days by your PCP).   Discharge patient  Complete by: As directed    Please discharge patient today after giving second dose of potassium chloride.   Discharge disposition: 06-Home-Health Care Svc   Discharge patient date: 07/13/2019   Increase activity slowly   Complete by: As directed      Allergies as of 07/13/2019   No Known Allergies     Medication List    TAKE these medications   Accu-Chek Aviva device Use to test blood sugars 1-2 times daily.   Accu-Chek Aviva Plus test strip Generic drug: glucose blood Use to test blood sugars 1-2 times daily.   Accu-Chek Softclix Lancets lancets Use to test blood sugars 1-2 times daily.   carvedilol 25 MG tablet Commonly known as: COREG Take 25 mg  by mouth 2 (two) times daily.   colchicine 0.6 MG tablet Take 1 tablet (0.6 mg total) by mouth daily. Pt states he is taking 1/2 tablet daily ( since 03/11/2019) What changed: additional instructions   cyclobenzaprine 5 MG tablet Commonly known as: FLEXERIL Take 5 mg by mouth 2 (two) times a day.   furosemide 20 MG tablet Commonly known as: LASIX Take 20 mg by mouth daily.   gabapentin 300 MG capsule Commonly known as: NEURONTIN Take 1 capsule (300 mg total) by mouth 3 (three) times daily.   insulin NPH Human 100 UNIT/ML injection Commonly known as: NOVOLIN N Inject 10-20 Units into the skin 2 (two) times daily before a meal. Patient states he takes 10 units when sugar is 300, if above 300 he will take 20 units per patient   insulin regular 100 units/mL injection Commonly known as: NOVOLIN R Use with sliding scale. What changed:   how much to take  how to take this  when to take this  additional instructions   metolazone 2.5 MG tablet Commonly known as: ZAROXOLYN Take 1 tablet (2.5 mg total) by mouth daily.   potassium chloride SA 20 MEQ tablet Commonly known as: KLOR-CON Take 1 tablet (20 mEq total) by mouth daily for 7 days.   rosuvastatin 10 MG tablet Commonly known as: CRESTOR Take 1 tablet (10 mg total) by mouth daily.   traZODone 50 MG tablet Commonly known as: DESYREL Take 2 tablets (100 mg total) by mouth at bedtime as needed for sleep.      Follow-up Information    [name], MD Follow up.   Specialty: Family Medicine Why: in 5 -7 days to have the following blood work - Engineer, materials information: [place] [id number] [phone]        Dorothy Spark, MD Follow up in 1 week(s).   Specialty: Cardiology Contact information: [address] [place] [address] [phone]        Evans Lance, MD .   Specialty: Cardiology Contact information: [phone] [address] [place] [address] [phone]          No Known Allergies  Consultations: None   Procedures/Studies: Ct Head Wo Contrast  Result Date: 07/09/2019 CLINICAL DATA:  Blood draining from right ear yesterday. The patient takes Coumadin. Elevated INR. EXAM: CT HEAD WITHOUT CONTRAST TECHNIQUE: Contiguous axial images were obtained from the base of the skull through the vertex without intravenous contrast. COMPARISON:  None. FINDINGS: Brain: 3 mm hypodensity in the left lentiform nucleus on image 18/3, possibly a remote small lacunar infarct or dilated perivascular space. Periventricular white matter and corona radiata hypodensities favor chronic ischemic microvascular white  matter disease. Otherwise, the brainstem, cerebellum, cerebral peduncles, thalamus, basal ganglia, basilar cisterns, and ventricular system appear within normal limits. No intracranial hemorrhage, mass lesion, or acute CVA. Vascular: There is atherosclerotic calcification of the cavernous carotid arteries bilaterally. Skull: Unremarkable Sinuses/Orbits: Minimal mucosal thickening in the ethmoid air cells compatible with mild chronic sinusitis. Material in the right external auditory canal could represent blood clot or cerumen. Other: No supplemental non-categorized findings. IMPRESSION: 1. No acute intracranial findings. 2. 3 mm in diameter dilated perivascular space or tiny remote lacunar infarct in the left lentiform nucleus. 3. Periventricular white matter and corona radiata hypodensities favor chronic ischemic microvascular white matter disease. 4. Material in the right external auditory canal could represent blood clot or cerumen. Electronically Signed   By: Van Clines M.D.   On: 07/09/2019 20:08      Discharge Exam: Vitals:   07/12/19 2027 07/13/19 0623  BP: 108/61 (!) 105/93  Pulse: 69 69  Resp: 18 16  Temp: 98.9 F (37.2 C) 98 F (36.7 C)  SpO2: 96% 95%   Vitals:   07/12/19 0430  07/12/19 1241 07/12/19 2027 07/13/19 0623  BP: 118/76 100/78 108/61 (!) 105/93  Pulse: 70 70 69 69  Resp:  18 18 16   Temp: 99 F (37.2 C) 98.1 F (36.7 C) 98.9 F (37.2 C) 98 F (36.7 C)  TempSrc: Oral Oral Oral Oral  SpO2: 93% 93% 96% 95%  Weight:      Height:        General: Pt is alert, awake, not in acute distress Cardiovascular: RRR, S1/S2 +, no rubs, no gallops Respiratory: CTA bilaterally, no wheezing, no rhonchi Abdominal: Soft, NT, ND, bowel sounds + Extremities: Trace edema bilateral lower extremity, no cyanosis    The results of significant diagnostics from this hospitalization (including imaging, microbiology, ancillary and laboratory) are listed below for reference.     Microbiology: Recent Results (from the past 240 hour(s))  SARS Coronavirus 2 [facility] order, Performed in [facility] hospital lab) Nasopharyngeal Nasopharyngeal Swab     Status: None   Collection Time: 07/09/19  8:26 PM   Specimen: Nasopharyngeal Swab  Result Value Ref Range Status   SARS Coronavirus 2 NEGATIVE NEGATIVE Final    Comment: (NOTE) If result is NEGATIVE SARS-CoV-2 target nucleic acids are NOT DETECTED. The SARS-CoV-2 RNA is generally detectable in upper and lower  respiratory specimens during the acute phase of infection. The lowest  concentration of SARS-CoV-2 viral copies this assay can detect is 250  copies / mL. A negative result does not preclude SARS-CoV-2 infection  and should not be used as the sole basis for treatment or other  patient management decisions.  A negative result may occur with  improper specimen collection / handling, submission of specimen other  than nasopharyngeal swab, presence of viral mutation(s) within the  areas targeted by this assay, and inadequate number of viral copies  (<250 copies / mL). A negative result must be combined with clinical  observations, patient history, and epidemiological information. If result is POSITIVE SARS-CoV-2 target  nucleic acids are DETECTED. The SARS-CoV-2 RNA is generally detectable in upper and lower  respiratory specimens dur ing the acute phase of infection.  Positive  results are indicative of active infection with SARS-CoV-2.  Clinical  correlation with patient history and other diagnostic information is  necessary to determine patient infection status.  Positive results do  not rule out bacterial infection or co-infection with other viruses. If result is PRESUMPTIVE POSTIVE SARS-CoV-2  nucleic acids MAY BE PRESENT.   A presumptive positive result was obtained on the submitted specimen  and confirmed on repeat testing.  While 2019 novel coronavirus  (SARS-CoV-2) nucleic acids may be present in the submitted sample  additional confirmatory testing may be necessary for epidemiological  and / or clinical management purposes  to differentiate between  SARS-CoV-2 and other Sarbecovirus currently known to infect humans.  If clinically indicated additional testing with an alternate test  methodology [phone]) is advised. The SARS-CoV-2 RNA is generally  detectable in upper and lower respiratory sp ecimens during the acute  phase of infection. The expected result is Negative. Fact Sheet for Patients:  StrictlyIdeas.no Fact Sheet for Healthcare Providers: BankingDealers.co.za This test is not yet approved or cleared by the [place] FDA and has been authorized for detection and/or diagnosis of SARS-CoV-2 by FDA under an Emergency Use Authorization (EUA).  This EUA will remain in effect (meaning this test can be used) for the duration of the COVID-19 declaration under Section 564(b)(1) of the Act, 21 U.S.C. section 360bbb-3(b)(1), unless the authorization is terminated or revoked sooner. Performed at [facility], [place] [address], [place], [place] [address]   MRSA PCR Screening     Status: Abnormal   Collection Time: 07/12/19 12:17 PM    Specimen: Nasal Mucosa; Nasopharyngeal  Result Value Ref Range Status   MRSA by PCR POSITIVE (A) NEGATIVE Final    Comment:        The GeneXpert MRSA Assay (FDA approved for NASAL specimens only), is one component of a comprehensive MRSA colonization surveillance program. It is not intended to diagnose MRSA infection nor to guide or monitor treatment for MRSA infections. RESULT CALLED TO, READ BACK BY AND VERIFIED WITH: Z. Calwitan RN 14:30 07/12/19 ([email or web address]) Performed at [facility], [place] [address], [place], [place] [address]      Labs: BNP (last 3 results) Recent Labs    02/03/19 1435  BNP [id number]*   Basic Metabolic Panel: Recent Labs  Lab 07/10/19 0516 07/11/19 0357 07/11/19 1456 07/11/19 2135 07/12/19 0505 07/12/19 1059 07/13/19 0814  NA 135 135 131* 134* 136  --  134*  K 2.3* 2.9* 4.8 2.8* 2.5*  --  3.3*  CL 87* 85* 86* 88* 89*  --  91*  CO2 31 34* 22 33* 32  --  28  GLUCOSE 153* 251* 290* 194* 92  --  349*  BUN 76* 75* 78* 77* 71*  --  67*  CREATININE 2.79* 2.55* 2.53* 2.63* 2.46*  --  2.40*  CALCIUM 8.8* 8.9 8.5* 8.6* 9.4  --  8.6*  MG 1.5* 2.1  --   --   --  1.8 1.7   Liver Function Tests: Recent Labs  Lab 07/09/19 1854  AST 24  ALT 15  ALKPHOS 26*  BILITOT 1.3*  PROT 8.1  ALBUMIN 2.8*   No results for input(s): LIPASE, AMYLASE in the last 168 hours. No results for input(s): AMMONIA in the last 168 hours. CBC: Recent Labs  Lab 07/09/19 1854 07/09/19 1918 07/11/19 0357 07/13/19 0814  WBC 14.5*  --  9.8 7.7  NEUTROABS 12.9*  --   --   --   HGB 17.1* 18.7* 15.5 14.4  HCT 49.4 55.0* 46.9 43.9  MCV 93.6  --  96.1 96.1  PLT 274  --  260 252   Cardiac Enzymes: No results for input(s): CKTOTAL, CKMB, CKMBINDEX, TROPONINI in the last 168 hours. BNP: Invalid input(s): POCBNP  CBG: Recent Labs  Lab 07/12/19 0823 07/12/19 1221 07/12/19 1719 07/12/19 2115 07/13/19 0756  GLUCAP 88 160* 324* 376* 304*   D-Dimer No results for  input(s): DDIMER in the last 72 hours. Hgb A1c No results for input(s): HGBA1C in the last 72 hours. Lipid Profile No results for input(s): CHOL, HDL, LDLCALC, TRIG, CHOLHDL, LDLDIRECT in the last 72 hours. Thyroid function studies No results for input(s): TSH, T4TOTAL, T3FREE, THYROIDAB in the last 72 hours.  Invalid input(s): FREET3 Anemia work up No results for input(s): VITAMINB12, FOLATE, FERRITIN, TIBC, IRON, RETICCTPCT in the last 72 hours. Urinalysis    Component Value Date/Time   COLORURINE YELLOW 02/03/2019 1859   APPEARANCEUR HAZY (A) 02/03/2019 1859   LABSPEC 1.008 02/03/2019 1859   PHURINE 5.0 02/03/2019 1859   GLUCOSEU NEGATIVE 02/03/2019 1859   HGBUR SMALL (A) 02/03/2019 1859   BILIRUBINUR NEGATIVE 02/03/2019 1859   KETONESUR NEGATIVE 02/03/2019 1859   PROTEINUR 100 (A) 02/03/2019 1859   UROBILINOGEN 0.2 11/13/2013 0630   NITRITE NEGATIVE 02/03/2019 1859   LEUKOCYTESUR LARGE (A) 02/03/2019 1859   Sepsis Labs Invalid input(s): PROCALCITONIN,  WBC,  LACTICIDVEN Microbiology Recent Results (from the past 240 hour(s))  SARS Coronavirus 2 [facility] order, Performed in [facility] hospital lab) Nasopharyngeal Nasopharyngeal Swab     Status: None   Collection Time: 07/09/19  8:26 PM   Specimen: Nasopharyngeal Swab  Result Value Ref Range Status   SARS Coronavirus 2 NEGATIVE NEGATIVE Final    Comment: (NOTE) If result is NEGATIVE SARS-CoV-2 target nucleic acids are NOT DETECTED. The SARS-CoV-2 RNA is generally detectable in upper and lower  respiratory specimens during the acute phase of infection. The lowest  concentration of SARS-CoV-2 viral copies this assay can detect is 250  copies / mL. A negative result does not preclude SARS-CoV-2 infection  and should not be used as the sole basis for treatment or other  patient management decisions.  A negative result may occur with  improper specimen collection / handling, submission of specimen other  than  nasopharyngeal swab, presence of viral mutation(s) within the  areas targeted by this assay, and inadequate number of viral copies  (<250 copies / mL). A negative result must be combined with clinical  observations, patient history, and epidemiological information. If result is POSITIVE SARS-CoV-2 target nucleic acids are DETECTED. The SARS-CoV-2 RNA is generally detectable in upper and lower  respiratory specimens dur ing the acute phase of infection.  Positive  results are indicative of active infection with SARS-CoV-2.  Clinical  correlation with patient history and other diagnostic information is  necessary to determine patient infection status.  Positive results do  not rule out bacterial infection or co-infection with other viruses. If result is PRESUMPTIVE POSTIVE SARS-CoV-2 nucleic acids MAY BE PRESENT.   A presumptive positive result was obtained on the submitted specimen  and confirmed on repeat testing.  While 2019 novel coronavirus  (SARS-CoV-2) nucleic acids may be present in the submitted sample  additional confirmatory testing may be necessary for epidemiological  and / or clinical management purposes  to differentiate between  SARS-CoV-2 and other Sarbecovirus currently known to infect humans.  If clinically indicated additional testing with an alternate test  methodology [phone]) is advised. The SARS-CoV-2 RNA is generally  detectable in upper and lower respiratory sp ecimens during the acute  phase of infection. The expected result is Negative. Fact Sheet for Patients:  StrictlyIdeas.no Fact Sheet for Healthcare Providers: BankingDealers.co.za This test is not yet  approved or cleared by the [place] and has been authorized for detection and/or diagnosis of SARS-CoV-2 by FDA under an Emergency Use Authorization (EUA).  This EUA will remain in effect (meaning this test can be used) for the duration of  the COVID-19 declaration under Section 564(b)(1) of the Act, 21 U.S.C. section 360bbb-3(b)(1), unless the authorization is terminated or revoked sooner. Performed at [facility], [place] [address], [place], [place] [address]   MRSA PCR Screening     Status: Abnormal   Collection Time: 07/12/19 12:17 PM   Specimen: Nasal Mucosa; Nasopharyngeal  Result Value Ref Range Status   MRSA by PCR POSITIVE (A) NEGATIVE Final    Comment:        The GeneXpert MRSA Assay (FDA approved for NASAL specimens only), is one component of a comprehensive MRSA colonization surveillance program. It is not intended to diagnose MRSA infection nor to guide or monitor treatment for MRSA infections. RESULT CALLED TO, READ BACK BY AND VERIFIED WITH: Z. Calwitan RN 14:30 07/12/19 ([email or web address]) Performed at [facility], [place] [address], [place], [place] [address]      Time coordinating discharge: Over 30 minutes  SIGNED:   Darliss Cheney, MD  [facility] 07/13/2019, 10:19 AM Pager [phone]  If 7PM-7AM, please contact night-coverage [email or web address] Password [email or web address]

## 2019-07-13 NOTE — Progress Notes (Signed)
Pt taken via ambulance home. Wife called by Probation officer per pt request

## 2019-07-13 NOTE — Discharge Instructions (Signed)

## 2019-07-14 ENCOUNTER — Telehealth: Payer: Self-pay | Admitting: *Deleted

## 2019-07-14 ENCOUNTER — Telehealth: Payer: Self-pay | Admitting: Cardiology

## 2019-07-14 ENCOUNTER — Other Ambulatory Visit: Payer: Self-pay

## 2019-07-14 NOTE — Telephone Encounter (Signed)
This message should be routed to [facility], where he is strictly followed.  His wife and Marchelle Gearing is aware that coumadin clinic will be calling them today, to provide further assistance with pts INR's, since being discharged from the hospital.

## 2019-07-14 NOTE — Patient Outreach (Addendum)
[facility]) Care Management  [place]  07/14/2019   Kurt Cross [birth year] [id number]  Subjective: Telephone call to patient.  Spoke with spouse Kurt Cross.  Patient discharge yesterday after INR of 10.2.  Patient currently off coumadin and waiting order to resume.  She states she is taking over patient care and has spoken with several providers. Cardiology and home care.  She states the plan for patient to resume home health is pending and that the therapist is trying to get power chair for patient to help with mobility as patient right wrist injury is a cyst and patient waiting for surgeon referral.  However, in the mean time goal is to keep INR stable and getting patient more mobile.  She is not sure about the INR check for this week.  Advised CM would call about INR.  She is appreciative.    Telephone call to [facility] there is currently not an order to resume care.  Expressed the need to have INR drawn this week and patient mobility is compromised.  Message to be sent to manager.    Telephone call to the coumadin clinic to request INR draw orders to be sent to [facility].  Message sent to Dr. Ethelene Hal for resumption of home health.    Telephone call back to wife to report the above.  She is thankful for help in coordinating his care.  Advised that CM would contact next week.  CM contact information given.    Objective:   Encounter Medications:  Outpatient Encounter Medications as of 07/14/2019  Medication Sig  . Accu-Chek Softclix Lancets lancets Use to test blood sugars 1-2 times daily.  . Blood Glucose Monitoring Suppl (ACCU-CHEK AVIVA) device Use to test blood sugars 1-2 times daily.  . carvedilol (COREG) 25 MG tablet Take 25 mg by mouth 2 (two) times daily.  . colchicine 0.6 MG tablet Take 1 tablet (0.6 mg total) by mouth daily. Pt states he is taking 1/2 tablet daily ( since 03/11/2019) (Patient taking differently: Take 0.6 mg by mouth daily. )  . cyclobenzaprine  (FLEXERIL) 5 MG tablet Take 5 mg by mouth 2 (two) times a day.  . furosemide (LASIX) 20 MG tablet Take 20 mg by mouth daily.  [name] Kitchen gabapentin (NEURONTIN) 300 MG capsule Take 1 capsule (300 mg total) by mouth 3 (three) times daily.  [name] Kitchen glucose blood (ACCU-CHEK AVIVA PLUS) test strip Use to test blood sugars 1-2 times daily.  . insulin NPH Human (NOVOLIN N) 100 UNIT/ML injection Inject 10-20 Units into the skin 2 (two) times daily before a meal. Patient states he takes 10 units when sugar is 300, if above 300 he will take 20 units per patient  . insulin regular (NOVOLIN R) 100 units/mL injection Use with sliding scale. (Patient taking differently: Inject 10 Units into the skin 2 (two) times daily before a meal. )  . metolazone (ZAROXOLYN) 2.5 MG tablet Take 1 tablet (2.5 mg total) by mouth daily.  . potassium chloride SA (KLOR-CON) 20 MEQ tablet Take 1 tablet (20 mEq total) by mouth daily for 7 days.  . rosuvastatin (CRESTOR) 10 MG tablet Take 1 tablet (10 mg total) by mouth daily.  . traZODone (DESYREL) 50 MG tablet Take 2 tablets (100 mg total) by mouth at bedtime as needed for sleep.   No facility-administered encounter medications on file as of 07/14/2019.     Functional Status:  In your present state of health, do you have any difficulty performing the  following activities: 07/13/2019 07/13/2019  Hearing? - N  Vision? - N  Difficulty concentrating or making decisions? - N  Walking or climbing stairs? - Y  Dressing or bathing? - N  Doing errands, shopping? Y Y  Preparing Food and eating ? - -  Using the Toilet? - -  In the past six months, have you accidently leaked urine? - -  Do you have problems with loss of bowel control? - -  Managing your Medications? - -  Managing your Finances? - -  Housekeeping or managing your Housekeeping? - -  Some recent data might be hidden    Fall/Depression Screening: Fall Risk  03/16/2019 11/05/2016 09/13/2016  Falls in the past year? 1 Yes Yes  Comment  patient states he slipped out of wheelchair approximately 3 months ago  last fall date:  05/2016  -  Number falls in past yr: 0 1 2 or more  Comment - - -  Injury with Fall? 0 No No  Risk Factor Category  - - High Fall Risk  Risk for fall due to : History of fall(s);Impaired mobility Impaired mobility History of fall(s);Impaired balance/gait;Impaired mobility;Medication side effect  Follow up Falls prevention discussed Falls prevention discussed Falls evaluation completed;Education provided;Falls prevention discussed   PHQ 2/9 Scores 06/28/2019 03/16/2019 11/16/2018 09/13/2016 09/04/2016  PHQ - 2 Score 0 0 0 0 0    Assessment: Patient needing home health resumption orders.-messaged MD Patient needing INR -Coumadin clinic aware.  Plan:  [facility] CM Care Plan Problem One     Most Recent Value  Care Plan Problem One  Recent hospitalization increased INR 10.2  Role Documenting the Problem One  Care Management Telephonic Trooper for Problem One  Active  [facility]N Long Term Goal   Patient will not readmit to hospital within 31 days.  [facility]N Long Term Goal Start Date  07/14/19  Interventions for Problem One Long Term Goal  Patient will have INR drawn within 5 business days.  Coumadin clinic called to send orders to home health.  [facility]N CM Short Term Goal #1   Patient/spouse will be able to state 3 heart failure symptoms within 30 days  [facility]N CM Short Term Goal #1 Start Date  07/14/19  Interventions for Short Term Goal #1  RN CM reviewed heart failure signs  THN CM Short Term Goal #2   Patient will begin process to get obtain powerchair within 2 weeks.  [facility]N CM Short Term Goal #2 Start Date  07/14/19  Interventions for Short Term Goal #2  Wife talked with home health and PCP to begin process.  THN CM Short Term Goal #3  Patient will receive referral to surgeon for right wrist cyst within 30 days.  THN CM Short Term Goal #3 Start Date  07/14/19  Interventions for Short Tern Goal #3  Wife has spoken with  physician and waiting on referral.       RN CM will contact patient/caregiver next week and wife agreeable.   RN CM will send update to PCP.     [name], RN, MSN [place] Management Care Management Coordinator Direct Line [phone] Cell [phone] Toll Free: [phone]  Fax: [phone]

## 2019-07-14 NOTE — Telephone Encounter (Signed)
Spoke with the pts wife, and she incorrectly requested this by the wrong Providers office.  Wife states that she called the pts PCP, for he was recently discharged by the hospital, and PT came to evaluate him, and suggest that he get a power wheel chair.  Wife states that she placed a call to his PCP to help get this expedited, and will obtain this request from them. Wife apologetic for the mistake call.

## 2019-07-14 NOTE — Telephone Encounter (Signed)
New message   Patient's wife wants to know how she can get a power chair ordered for the patient. Please call to discuss.

## 2019-07-14 NOTE — Telephone Encounter (Signed)
Transition Care Management Follow-up Telephone Call   Date discharged? 07/13/19 /  How have you been since you were released from the hospital? "Still a little weak"   Do you understand why you were in the hospital? yes   Do you understand the discharge instructions? yes   Where were you discharged to? home   Items Reviewed:  Medications reviewed: "They started giving me potassium"  Allergies reviewed: yes  Dietary changes reviewed: yes  Referrals reviewed: yes   Functional Questionnaire:   Activities of Daily Living (ADLs):   He states they are independent in the following: ambulation, bathing and hygiene, feeding, continence, grooming, toileting and dressing States they require assistance with the following: using wheelchair. Has nurse tech 2 days per wk to health w/ bath.   Any transportation issues/concerns?: no   Any patient concerns? no   Confirmed importance and date/time of follow-up visits scheduled yes  Provider Appointment booked with PCP 07/20/19  Confirmed with patient if condition begins to worsen call PCP or go to the ER.  Patient was given the office number and encouraged to call back with question or concerns.  : yes

## 2019-07-14 NOTE — Telephone Encounter (Signed)
Dionne with [facility] states patient has homecare and is due for INR, they need order sent to homecare so it can be done. The home care is  [facility] there phone number is [phone].   Any questions for Dionne, she can be reached at [phone]

## 2019-07-14 NOTE — Telephone Encounter (Signed)
Pt's wife left a message stating that pt was discharged from the hospital and was not sure how pt was to continue taking his coumadin. Called pt's wife back and instructed pt to continue his normal dose 1 tablet (10 mg) daily except for 1/2 a tablet (5mg ) on Saturdays. Pt will get updated instructions at his coag visit. Spoke with pharm D and will have pt come in tomorrow 10/8 prior to his pacer check.   Called [facility] [phone], (see telephone/order encounter 10/7). [facility] stated that pt's home care is on hold for now. Pt will need to come to [facility] to get INR checked.

## 2019-07-14 NOTE — Telephone Encounter (Signed)
Spoke to pt's wife and [facility]. Pt is coming in 10/8 to get his INR checked.

## 2019-07-15 ENCOUNTER — Encounter: Payer: Self-pay | Admitting: Emergency Medicine

## 2019-07-15 ENCOUNTER — Ambulatory Visit (INDEPENDENT_AMBULATORY_CARE_PROVIDER_SITE_OTHER): Payer: Medicare HMO | Admitting: *Deleted

## 2019-07-15 ENCOUNTER — Other Ambulatory Visit: Payer: Self-pay

## 2019-07-15 DIAGNOSIS — R6889 Other general symptoms and signs: Secondary | ICD-10-CM | POA: Diagnosis not present

## 2019-07-15 DIAGNOSIS — Z5181 Encounter for therapeutic drug level monitoring: Secondary | ICD-10-CM

## 2019-07-15 DIAGNOSIS — I5042 Chronic combined systolic (congestive) and diastolic (congestive) heart failure: Secondary | ICD-10-CM

## 2019-07-15 DIAGNOSIS — I4821 Permanent atrial fibrillation: Secondary | ICD-10-CM

## 2019-07-15 LAB — CUP PACEART INCLINIC DEVICE CHECK
Battery Remaining Longevity: 1 mo
Battery Voltage: 2.74 V
Brady Statistic AP VP Percent: 0 %
Brady Statistic AP VS Percent: 0 %
Brady Statistic AS VP Percent: 93.59 %
Brady Statistic AS VS Percent: 6.41 %
Brady Statistic RA Percent Paced: 0 %
Brady Statistic RV Percent Paced: 94.27 %
Date Time Interrogation Session: 20201008131239
Implantable Lead Implant Date: 20080911
Implantable Lead Implant Date: 20080911
Implantable Lead Implant Date: 20080911
Implantable Lead Location: 753858
Implantable Lead Location: 753859
Implantable Lead Location: 753860
Implantable Lead Model: 4194
Implantable Lead Model: 5076
Implantable Lead Model: 5076
Implantable Pulse Generator Implant Date: 20150724
Lead Channel Impedance Value: 200 Ohm
Lead Channel Impedance Value: 342 Ohm
Lead Channel Impedance Value: 399 Ohm
Lead Channel Impedance Value: 418 Ohm
Lead Channel Impedance Value: 475 Ohm
Lead Channel Impedance Value: 513 Ohm
Lead Channel Impedance Value: 513 Ohm
Lead Channel Impedance Value: 627 Ohm
Lead Channel Impedance Value: 684 Ohm
Lead Channel Pacing Threshold Amplitude: 0.625 V
Lead Channel Pacing Threshold Pulse Width: 0.4 ms
Lead Channel Sensing Intrinsic Amplitude: 4.375 mV
Lead Channel Sensing Intrinsic Amplitude: 4.375 mV
Lead Channel Sensing Intrinsic Amplitude: 9.125 mV
Lead Channel Sensing Intrinsic Amplitude: 9.125 mV
Lead Channel Setting Pacing Amplitude: 2 V
Lead Channel Setting Pacing Amplitude: 6 V
Lead Channel Setting Pacing Pulse Width: 0.4 ms
Lead Channel Setting Pacing Pulse Width: 1.5 ms
Lead Channel Setting Sensing Sensitivity: 0.9 mV

## 2019-07-15 LAB — POCT INR: INR: 2.2 (ref 2.0–3.0)

## 2019-07-15 NOTE — Patient Instructions (Signed)
Description   Call Kurt Cross with results after every visit. Resume taking your normal dose of 1 tablet daily except 1/2 tablet on Saturdays. Recheck INR in 1 week. [facility] [phone]

## 2019-07-15 NOTE — Progress Notes (Signed)
1 month battery check. Patient does not do remote transmissions. Reached ERI on 06/27/19. Will be scheduled for gen change with Dr Rayann Heman. Smith RN aware of ERI and will contact patient to put him on schedule.

## 2019-07-15 NOTE — Patient Instructions (Signed)
Will receive call tomorrow by Erskine [name] to schedule new pacemaker generator change and with final instructions.. Instructions provided and surgical soap given for the night before procedure.Kurt Cross

## 2019-07-15 NOTE — Telephone Encounter (Signed)
This encounter was created in error - please disregard.

## 2019-07-15 NOTE — Progress Notes (Unsigned)
Patient reached ERI 06/27/19. Needds to be placed on Dr Allred's schedule for gen change. Call wife to set up appointment at [phone].

## 2019-07-16 ENCOUNTER — Other Ambulatory Visit: Payer: Self-pay

## 2019-07-16 DIAGNOSIS — I4892 Unspecified atrial flutter: Secondary | ICD-10-CM | POA: Diagnosis not present

## 2019-07-16 DIAGNOSIS — Z794 Long term (current) use of insulin: Secondary | ICD-10-CM

## 2019-07-16 DIAGNOSIS — N184 Chronic kidney disease, stage 4 (severe): Secondary | ICD-10-CM | POA: Diagnosis not present

## 2019-07-16 DIAGNOSIS — I5042 Chronic combined systolic (congestive) and diastolic (congestive) heart failure: Secondary | ICD-10-CM | POA: Diagnosis not present

## 2019-07-16 DIAGNOSIS — J9611 Chronic respiratory failure with hypoxia: Secondary | ICD-10-CM | POA: Diagnosis not present

## 2019-07-16 DIAGNOSIS — I130 Hypertensive heart and chronic kidney disease with heart failure and stage 1 through stage 4 chronic kidney disease, or unspecified chronic kidney disease: Secondary | ICD-10-CM | POA: Diagnosis not present

## 2019-07-16 DIAGNOSIS — E1122 Type 2 diabetes mellitus with diabetic chronic kidney disease: Secondary | ICD-10-CM | POA: Diagnosis not present

## 2019-07-16 DIAGNOSIS — E1140 Type 2 diabetes mellitus with diabetic neuropathy, unspecified: Secondary | ICD-10-CM | POA: Diagnosis not present

## 2019-07-16 DIAGNOSIS — I4891 Unspecified atrial fibrillation: Secondary | ICD-10-CM | POA: Diagnosis not present

## 2019-07-16 DIAGNOSIS — M109 Gout, unspecified: Secondary | ICD-10-CM | POA: Diagnosis not present

## 2019-07-17 DIAGNOSIS — N184 Chronic kidney disease, stage 4 (severe): Secondary | ICD-10-CM | POA: Diagnosis not present

## 2019-07-17 DIAGNOSIS — R5381 Other malaise: Secondary | ICD-10-CM | POA: Diagnosis not present

## 2019-07-17 DIAGNOSIS — M109 Gout, unspecified: Secondary | ICD-10-CM | POA: Diagnosis not present

## 2019-07-17 DIAGNOSIS — I509 Heart failure, unspecified: Secondary | ICD-10-CM | POA: Diagnosis not present

## 2019-07-17 DIAGNOSIS — J9621 Acute and chronic respiratory failure with hypoxia: Secondary | ICD-10-CM | POA: Diagnosis not present

## 2019-07-19 ENCOUNTER — Telehealth: Payer: Self-pay | Admitting: Family Medicine

## 2019-07-19 ENCOUNTER — Other Ambulatory Visit: Payer: Self-pay

## 2019-07-19 NOTE — Telephone Encounter (Signed)
Questions for Screening COVID-19  Symptom onset: N/a  Travel or Contacts: no  During this illness, did/does the patient experience any of the following symptoms? Fever >100.71F []   Yes [x]   No []   Unknown Subjective fever (felt feverish) []   Yes [x]   No []   Unknown Chills []   Yes [x]   No []   Unknown Muscle aches (myalgia) []   Yes [x]   No []   Unknown Runny nose (rhinorrhea) []   Yes [x]   No []   Unknown Sore throat []   Yes [x]   No []   Unknown Cough (new onset or worsening of chronic cough) []   Yes [x]   No []   Unknown Shortness of breath (dyspnea) []   Yes [x]   No []   Unknown Nausea or vomiting []   Yes [x]   No []   Unknown Headache []   Yes [x]   No []   Unknown Abdominal pain  []   Yes [x]   No []   Unknown Diarrhea (?3 loose/looser than normal stools/24hr period) []   Yes [x]   No []   Unknown Other, specify:

## 2019-07-19 NOTE — Patient Outreach (Signed)
[facility]) Care Management  07/19/2019  Kurt Cross 06/11/1951 [id number]   Spoke with wife who states patient is about the same. Patient to see PCP this week.  She states she will discuss home health as they have not been out.  Advised her that the MD has to see that patient before he will write orders for home health.  She verbalized understanding.  Patient INR was within range.  Patient on coumadin.  Discussed signs of bleeding and importance of having INR checked.  She verbalized understanding.  Advised wife that CM would check in next week and can call CM for any questions or concerns.    Plan: RN CM will contact wife next week and she is agreeable.    Kurt Baseman, RN, MSN [place] Management Care Management Coordinator Direct Line [phone] Cell [phone] Toll Free: [phone]  Fax: [phone]

## 2019-07-20 ENCOUNTER — Ambulatory Visit (INDEPENDENT_AMBULATORY_CARE_PROVIDER_SITE_OTHER): Payer: Medicare HMO | Admitting: *Deleted

## 2019-07-20 ENCOUNTER — Ambulatory Visit (INDEPENDENT_AMBULATORY_CARE_PROVIDER_SITE_OTHER): Payer: Medicare HMO | Admitting: Family Medicine

## 2019-07-20 ENCOUNTER — Other Ambulatory Visit: Payer: Self-pay

## 2019-07-20 ENCOUNTER — Encounter: Payer: Self-pay | Admitting: Family Medicine

## 2019-07-20 ENCOUNTER — Telehealth: Payer: Self-pay

## 2019-07-20 VITALS — BP 110/60 | HR 77 | Ht 71.0 in

## 2019-07-20 DIAGNOSIS — E1122 Type 2 diabetes mellitus with diabetic chronic kidney disease: Secondary | ICD-10-CM | POA: Diagnosis not present

## 2019-07-20 DIAGNOSIS — M109 Gout, unspecified: Secondary | ICD-10-CM

## 2019-07-20 DIAGNOSIS — R6889 Other general symptoms and signs: Secondary | ICD-10-CM | POA: Diagnosis not present

## 2019-07-20 DIAGNOSIS — E11621 Type 2 diabetes mellitus with foot ulcer: Secondary | ICD-10-CM | POA: Diagnosis not present

## 2019-07-20 DIAGNOSIS — N184 Chronic kidney disease, stage 4 (severe): Secondary | ICD-10-CM | POA: Diagnosis not present

## 2019-07-20 DIAGNOSIS — E1140 Type 2 diabetes mellitus with diabetic neuropathy, unspecified: Secondary | ICD-10-CM | POA: Diagnosis not present

## 2019-07-20 DIAGNOSIS — I4892 Unspecified atrial flutter: Secondary | ICD-10-CM | POA: Diagnosis not present

## 2019-07-20 DIAGNOSIS — I4821 Permanent atrial fibrillation: Secondary | ICD-10-CM

## 2019-07-20 DIAGNOSIS — I5042 Chronic combined systolic (congestive) and diastolic (congestive) heart failure: Secondary | ICD-10-CM | POA: Diagnosis not present

## 2019-07-20 DIAGNOSIS — R5381 Other malaise: Secondary | ICD-10-CM | POA: Diagnosis not present

## 2019-07-20 DIAGNOSIS — E782 Mixed hyperlipidemia: Secondary | ICD-10-CM

## 2019-07-20 DIAGNOSIS — E876 Hypokalemia: Secondary | ICD-10-CM | POA: Diagnosis not present

## 2019-07-20 DIAGNOSIS — I130 Hypertensive heart and chronic kidney disease with heart failure and stage 1 through stage 4 chronic kidney disease, or unspecified chronic kidney disease: Secondary | ICD-10-CM | POA: Diagnosis not present

## 2019-07-20 DIAGNOSIS — Z794 Long term (current) use of insulin: Secondary | ICD-10-CM | POA: Diagnosis not present

## 2019-07-20 DIAGNOSIS — Z5181 Encounter for therapeutic drug level monitoring: Secondary | ICD-10-CM | POA: Diagnosis not present

## 2019-07-20 DIAGNOSIS — I5020 Unspecified systolic (congestive) heart failure: Secondary | ICD-10-CM | POA: Diagnosis not present

## 2019-07-20 DIAGNOSIS — I4891 Unspecified atrial fibrillation: Secondary | ICD-10-CM | POA: Diagnosis not present

## 2019-07-20 DIAGNOSIS — J9611 Chronic respiratory failure with hypoxia: Secondary | ICD-10-CM | POA: Diagnosis not present

## 2019-07-20 LAB — COMPREHENSIVE METABOLIC PANEL
ALT: 10 U/L (ref 0–53)
AST: 17 U/L (ref 0–37)
Albumin: 3.2 g/dL — ABNORMAL LOW (ref 3.5–5.2)
Alkaline Phosphatase: 26 U/L — ABNORMAL LOW (ref 39–117)
BUN: 51 mg/dL — ABNORMAL HIGH (ref 6–23)
CO2: 35 mEq/L — ABNORMAL HIGH (ref 19–32)
Calcium: 9.3 mg/dL (ref 8.4–10.5)
Chloride: 92 mEq/L — ABNORMAL LOW (ref 96–112)
Creatinine, Ser: 2.45 mg/dL — ABNORMAL HIGH (ref 0.40–1.50)
GFR: 32.14 mL/min — ABNORMAL LOW (ref >60.00)
Glucose, Bld: 187 mg/dL — ABNORMAL HIGH (ref 70–99)
Potassium: 2.6 mEq/L — CL (ref 3.5–5.1)
Sodium: 137 mEq/L (ref 135–145)
Total Bilirubin: 0.8 mg/dL (ref 0.2–1.2)
Total Protein: 7.5 g/dL (ref 6.0–8.3)

## 2019-07-20 LAB — LIPID PANEL
Cholesterol: 111 mg/dL (ref 0–200)
HDL: 20.7 mg/dL — ABNORMAL LOW (ref >39.00)
LDL Cholesterol: 58 mg/dL (ref 0–99)
NonHDL: 89.91
Total CHOL/HDL Ratio: 5
Triglycerides: 160 mg/dL — ABNORMAL HIGH (ref 0.0–149.0)
VLDL: 32 mg/dL (ref 0.0–40.0)

## 2019-07-20 LAB — HEMOGLOBIN A1C: Hgb A1c MFr Bld: 10 % — ABNORMAL HIGH (ref 4.6–6.5)

## 2019-07-20 LAB — LDL CHOLESTEROL, DIRECT: Direct LDL: 63 mg/dL

## 2019-07-20 LAB — CBC
HCT: 45.2 % (ref 39.0–52.0)
Hemoglobin: 15.1 g/dL (ref 13.0–17.0)
MCHC: 33.4 g/dL (ref 30.0–36.0)
MCV: 96.7 fl (ref 78.0–100.0)
Platelets: 231 10*3/uL (ref 150.0–400.0)
RBC: 4.68 Mil/uL (ref 4.22–5.81)
RDW: 15.2 % (ref 11.5–15.5)
WBC: 9.8 10*3/uL (ref 4.0–10.5)

## 2019-07-20 LAB — POCT INR: INR: 2.1 (ref 2.0–3.0)

## 2019-07-20 LAB — URIC ACID: Uric Acid, Serum: 13.5 mg/dL — ABNORMAL HIGH (ref 4.0–7.8)

## 2019-07-20 MED ORDER — POTASSIUM CHLORIDE CRYS ER 20 MEQ PO TBCR: EXTENDED_RELEASE_TABLET | ORAL | 1 refills | Status: DC

## 2019-07-20 MED ORDER — POTASSIUM CHLORIDE CRYS ER 20 MEQ PO TBCR: EXTENDED_RELEASE_TABLET | ORAL | 2 refills | Status: DC

## 2019-07-20 NOTE — Patient Instructions (Addendum)
Description   Call Kurt Cross with results after every visit. Continue taking your normal dose of 1 tablet daily except 1/2 tablet on Saturdays. Recheck INR in 1 week. [facility] [phone]

## 2019-07-20 NOTE — Telephone Encounter (Signed)
Call placed to Pt's wife  Pt scheduled for covid test on 10/19  Procedure 10/22   Instruction letter completed and given to coumadin clinic for pick up

## 2019-07-20 NOTE — Addendum Note (Signed)
Addended by: Jon Billings on: 07/20/2019 04:57 PM   Modules accepted: Orders

## 2019-07-20 NOTE — Progress Notes (Addendum)
Established Patient Office Visit  Subjective:  Patient ID: Kurt Cross, male    DOB: July 11, 1951  Age: 68 y.o. MRN: [id number]  CC:  Chief Complaint  Patient presents with  . Hospitalization Follow-up    HPI Kurt Cross presents for hospitalization follow-up status post acute bleed with elevated INR.  INR is back down to 2.2.  Patient here for follow-up of his chronic medical problems.  Continues to live at home with visiting home health nurse and occupational therapy.  Podiatry is following his chronic heel wounds.  Past Medical History:  Diagnosis Date  . AAA (abdominal aortic aneurysm)/ 4.5 cm ascending per CT angio 08/01/13 08/12/2013  . Abscess 02/13/2018  . Acute on chronic renal failure (HCC) 01/27/2016   02/15/16 Na 130, K 4.6, Bun 47, creat 2.07    . Acute on chronic respiratory failure ([place]) 01/27/2016  . Acute on chronic respiratory failure with hypoxia ([place]) 01/27/2016   02/14/16 CXR no acute pulmonary abnormality, a nodule density noted at the right upper lobe, could represent a granuloma versus neoplastic lesion, may CT   . Acute renal failure (ARF) ([place]) 08/15/2013  . AKI (acute kidney injury) ([place]) 11/12/2013  . Atrial fibrillation ([place])   . Atrial flutter ([place]) 02/22/2016  . Benign hypertension   . Bradycardia   . BRONCHITIS, CHRONIC 09/01/2007   Qualifier: Diagnosis of  By: Doy Mince LPN, Megan    . Cellulitis of left lower extremity 02/14/2018  . CHF (congestive heart failure), NYHA class II ([place])   . Chronic anticoagulation 02/22/2016  . Chronic combined systolic and diastolic CHF (congestive heart failure) ([place]) 08/04/2013   02/14/16 CXR no acute pulmonary abnormality, a nodule density noted at the right upper lobe, could represent a granuloma versus neoplastic lesion, may CT   . Chronic respiratory failure ([place]) 02/10/2016  . Chronic ulcer of left heel ([place]) 02/17/2018  . CKD (chronic kidney disease), stage III 02/17/2018  . Complete heart block (HCC)    s/p PPM Bi-Ven   . Constipation, chronic 08/03/2013  . Debility 02/10/2016  . Decubitus ulcer of sacral region, stage 3 ([place]) 02/14/2018  . Diabetes mellitus, type 2 ([place])   . H/O medication noncompliance 02/22/2016  . Hernia of abdominal cavity   . Hyperkalemia 11/12/2013  . Hyperlipidemia   . Hypertension   . Hypokalemia   . Hypoxemia   . Hypoxia 01/26/2016  . 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 Kidney failure   . Neuropathy   . Obesity, Class III, BMI 40-49.9 (morbid obesity) ([place]) 09/01/2007   Qualifier: Diagnosis of  By: Doy Mince LPN, Megan    . Obstructive sleep apnea 09/01/2007   Qualifier: Diagnosis of  By: Doy Mince LPN, Megan    . OSA (obstructive sleep apnea)   . Recurrent ventral incisional hernia 08/14/2013  . Right leg pain 08/11/2013  . S/P placement of cardiac pacemaker 02/22/2016  . Sepsis ([place]) 08/02/2013  . Short gut syndrome due to distal jejunal SB fistula 10/22/2013    Past Surgical History:  Procedure Laterality Date  . APPENDECTOMY    . biventricular pacemaker generator change  04/29/2014   MDT Viva CRT-P BiV pacemaker implanted by Dr Rolla Etienne in [place]  . biventricular pacemaker implantation  06/18/2007   MDT BIV pacemaker implanted by Dr Lovena Le  . BOWEL RESECTION  1980s?   ?colectomy/ostomy - Dr Elesa Hacker  . INCISION AND DRAINAGE ABSCESS  N/A 08/14/2013   Procedure: INCISION AND DRAINAGE ABSCESS, mesh ;  Surgeon: [name], MD;  Location: [facility]S;  Service: General;  Laterality: N/A;  . [place]?   Dr. Lindon Romp - w mesh  . KNEE SURGERY Right   . LEFT HEART CATHETERIZATION WITH CORONARY ANGIOGRAM N/A 03/17/2014   Procedure: LEFT HEART CATHETERIZATION WITH CORONARY ANGIOGRAM;  Surgeon: [name], MD;  Location: [facility] CATH [facility];  Service: Cardiovascular;  Laterality: N/A;  . NASAL SEPTUM SURGERY    . OSTOMY TAKE DOWN  1980s?   Dr Elesa Hacker   . TONSILLECTOMY AND ADENOIDECTOMY      Family History  Problem Relation Age of Onset  . Diabetes Mother   . Alcohol abuse Mother   . Alcoholism Father   . Heart Problems Maternal Grandmother     Social History   Socioeconomic History  . Marital status: Married    Spouse name: Not on file  . Number of children: Not on file  . Years of education: Not on file  . Highest education level: Not on file  Occupational History  . Not on file  Social Needs  . Financial resource strain: Not on file  . Food insecurity    Worry: Never true    Inability: Never true  . Transportation needs    Medical: No    Non-medical: No  Tobacco Use  . Smoking status: Former Smoker    Packs/day: 1.00    Years: 40.00    Pack years: 40.00    Types: Cigarettes    Quit date: 08/21/2012    Years since quitting: 6.9  . Smokeless tobacco: Never Used  Substance and Sexual Activity  . Alcohol use: Not Currently    Alcohol/week: 0.0 standard drinks    Comment: occasional  . Drug use: No  . Sexual activity: Not on file  Lifestyle  . Physical activity    Days per week: Not on file    Minutes per session: Not on file  . Stress: Not on file  Relationships  . Social Herbalist on phone: Not on file    Gets together: Not on file    Attends religious service: Not on file    Active member of club or organization: Not on file    Attends meetings of clubs or organizations: Not on file    Relationship status: Not on file  . Intimate partner violence    Fear of current or ex partner: Not on file    Emotionally abused: Not on file    Physically abused: Not on file    Forced sexual activity: Not on file  Other Topics Concern  . Not on file  Social History Narrative   Lives at [place] since 02/06/16   Married -[name]   Former smoker -stopped 2013   No Advance Directives    Outpatient Medications Prior to Visit  Medication Sig Dispense Refill  . Accu-Chek Softclix Lancets lancets Use to  test blood sugars 1-2 times daily. 100 each 12  . Blood Glucose Monitoring Suppl (ACCU-CHEK AVIVA) device Use to test blood sugars 1-2 times daily. 1 each 0  . carvedilol (COREG) 25 MG tablet Take 25 mg by mouth 2 (two) times daily.  3  . colchicine 0.6 MG tablet Take 1 tablet (0.6 mg total) by mouth daily. Pt states he is taking 1/2 tablet daily ( since 03/11/2019) (Patient taking differently: Take 0.6 mg by mouth daily. ) 90  tablet 2  . cyclobenzaprine (FLEXERIL) 5 MG tablet Take 5 mg by mouth 2 (two) times a day.    . furosemide (LASIX) 20 MG tablet Take 20 mg by mouth daily.    [name] Kitchen gabapentin (NEURONTIN) 300 MG capsule Take 1 capsule (300 mg total) by mouth 3 (three) times daily. 270 capsule 1  . glucose blood (ACCU-CHEK AVIVA PLUS) test strip Use to test blood sugars 1-2 times daily. 100 each 12  . insulin NPH Human (NOVOLIN N) 100 UNIT/ML injection Inject 10-20 Units into the skin 2 (two) times daily before a meal. Patient states he takes 10 units when sugar is 300, if above 300 he will take 20 units per patient    . insulin regular (NOVOLIN R) 100 units/mL injection Use with sliding scale. (Patient taking differently: Inject 10 Units into the skin 2 (two) times daily before a meal. ) 10 mL 11  . metolazone (ZAROXOLYN) 2.5 MG tablet Take 1 tablet (2.5 mg total) by mouth daily. 90 tablet 1  . rosuvastatin (CRESTOR) 10 MG tablet Take 1 tablet (10 mg total) by mouth daily. 90 tablet 1  . traZODone (DESYREL) 50 MG tablet Take 2 tablets (100 mg total) by mouth at bedtime as needed for sleep. 90 tablet 1  . potassium chloride SA (KLOR-CON) 20 MEQ tablet Take 1 tablet (20 mEq total) by mouth daily for 7 days. 7 tablet 0   No facility-administered medications prior to visit.     No Known Allergies  ROS Review of Systems  Constitutional: Negative for diaphoresis, fatigue, fever and unexpected weight change.  HENT: Negative.   Eyes: Negative for photophobia and visual disturbance.  Respiratory:  Negative for chest tightness and shortness of breath.   Cardiovascular: Negative for chest pain.  Gastrointestinal: Negative.   Endocrine: Negative for polyphagia and polyuria.  Musculoskeletal: Positive for gait problem.  Skin: Negative for color change and pallor.  Neurological: Negative for seizures and speech difficulty.  Psychiatric/Behavioral: Negative.       Objective:    Physical Exam  Constitutional: He is oriented to person, place, and time. He appears well-developed. No distress.  HENT:  Head: Normocephalic and atraumatic.  Right Ear: External ear normal.  Left Ear: External ear normal.  Eyes: Right eye exhibits no discharge. Left eye exhibits no discharge. No scleral icterus.  Neck: No JVD present. No tracheal deviation present.  Cardiovascular: Normal rate and normal heart sounds. An irregularly irregular rhythm present.  Pulmonary/Chest: Effort normal and breath sounds normal. No stridor.  Abdominal: Bowel sounds are normal.  Musculoskeletal:        General: No edema.       Feet:  Neurological: He is alert and oriented to person, place, and time.  Skin: Skin is warm and dry. He is not diaphoretic.  Psychiatric: He has a normal mood and affect. His behavior is normal.    BP 110/60   Pulse 77   Ht 5\' 11"  (1.803 m)   SpO2 95%   BMI 38.80 kg/m  Wt Readings from Last 3 Encounters:  07/10/19 278 lb 3.5 oz (126.2 kg)  03/16/19 280 lb (127 kg)  03/08/19 280 lb (127 kg)   BP Readings from Last 3 Encounters:  07/20/19 110/60  07/13/19 120/65  03/12/19 128/80   Guideline developer:  UpToDate (see UpToDate for funding source) Date Released: June 2014  Health Maintenance Due  Topic Date Due  . Hepatitis C Screening  04/24/1951  . FOOT EXAM  05/08/1963  .  OPHTHALMOLOGY EXAM  05/08/1963  . URINE MICROALBUMIN  05/08/1963  . TETANUS/TDAP  05/07/1972  . COLONOSCOPY  05/08/2003  . PNA vac Low Risk Adult (1 of 2 - PCV13) 05/07/2018  . INFLUENZA VACCINE   05/08/2019    There are no preventive care reminders to display for this patient.  Lab Results  Component Value Date   TSH 1.287 02/03/2019   Lab Results  Component Value Date   WBC 9.8 07/20/2019   HGB 15.1 07/20/2019   HCT 45.2 07/20/2019   MCV 96.7 07/20/2019   PLT 231.0 07/20/2019   Lab Results  Component Value Date   NA 137 07/20/2019   K 2.6 (LL) 07/20/2019   CO2 35 (H) 07/20/2019   GLUCOSE 187 (H) 07/20/2019   BUN 51 (H) 07/20/2019   CREATININE 2.45 (H) 07/20/2019   BILITOT 0.8 07/20/2019   ALKPHOS 26 (L) 07/20/2019   AST 17 07/20/2019   ALT 10 07/20/2019   PROT 7.5 07/20/2019   ALBUMIN 3.2 (L) 07/20/2019   CALCIUM 9.3 07/20/2019   ANIONGAP 15 07/13/2019   GFR 32.14 (L) 07/20/2019   Lab Results  Component Value Date   CHOL 111 07/20/2019   Lab Results  Component Value Date   HDL 20.70 (L) 07/20/2019   Lab Results  Component Value Date   LDLCALC 58 07/20/2019   Lab Results  Component Value Date   TRIG 160.0 (H) 07/20/2019   Lab Results  Component Value Date   CHOLHDL 5 07/20/2019   Lab Results  Component Value Date   HGBA1C 10.0 (H) 07/20/2019      Assessment & Plan:   Problem List Items Addressed This Visit      Endocrine   Type 2 diabetes mellitus with stage 4 chronic kidney disease, with long-term current use of insulin (HCC)   Relevant Orders   CBC (Completed)   Comprehensive metabolic panel (Completed)   Hemoglobin A1c (Completed)   Urinalysis, Routine w reflex microscopic   Microalbumin / creatinine urine ratio   Ambulatory referral to Ophthalmology   DME Wheelchair electric     Musculoskeletal and Integument   Gouty arthritis - Primary   Relevant Orders   Uric acid (Completed)   DME Wheelchair electric     Genitourinary   Chronic kidney disease (CKD), stage IV (severe) ([place])   Relevant Orders   DME Wheelchair electric     Other   Hyperlipidemia (Chronic)   Relevant Orders   LDL cholesterol, direct (Completed)    Lipid panel (Completed)   Debility   Relevant Orders   DME Wheelchair electric   Hypokalemia   Relevant Medications   potassium chloride SA (KLOR-CON) 20 MEQ tablet      Meds ordered this encounter  Medications  . DISCONTD: potassium chloride SA (KLOR-CON) 20 MEQ tablet    Sig: Take one daily    Dispense:  30 tablet    Refill:  2  . potassium chloride SA (KLOR-CON) 20 MEQ tablet    Sig: Take one twice daily for 7 days and then return for potassium level.    Dispense:  60 tablet    Refill:  1    Follow-up: Return in about 1 month (around 08/20/2019).   Continue with visiting home health nursing and podiatry.  Renal function appears to be deteriorating.  Will consider nephrology consult.  Concerned about diabetes control on review of the hospital record.   Will discuss with visiting home health nursing.

## 2019-07-22 ENCOUNTER — Encounter (HOSPITAL_COMMUNITY): Payer: Medicare HMO | Admitting: Cardiology

## 2019-07-23 DIAGNOSIS — N184 Chronic kidney disease, stage 4 (severe): Secondary | ICD-10-CM | POA: Diagnosis not present

## 2019-07-23 DIAGNOSIS — I130 Hypertensive heart and chronic kidney disease with heart failure and stage 1 through stage 4 chronic kidney disease, or unspecified chronic kidney disease: Secondary | ICD-10-CM | POA: Diagnosis not present

## 2019-07-23 DIAGNOSIS — M109 Gout, unspecified: Secondary | ICD-10-CM | POA: Diagnosis not present

## 2019-07-23 DIAGNOSIS — I5042 Chronic combined systolic (congestive) and diastolic (congestive) heart failure: Secondary | ICD-10-CM | POA: Diagnosis not present

## 2019-07-23 DIAGNOSIS — E1122 Type 2 diabetes mellitus with diabetic chronic kidney disease: Secondary | ICD-10-CM | POA: Diagnosis not present

## 2019-07-23 DIAGNOSIS — J9611 Chronic respiratory failure with hypoxia: Secondary | ICD-10-CM | POA: Diagnosis not present

## 2019-07-23 DIAGNOSIS — I4891 Unspecified atrial fibrillation: Secondary | ICD-10-CM | POA: Diagnosis not present

## 2019-07-23 DIAGNOSIS — E1140 Type 2 diabetes mellitus with diabetic neuropathy, unspecified: Secondary | ICD-10-CM | POA: Diagnosis not present

## 2019-07-23 DIAGNOSIS — I4892 Unspecified atrial flutter: Secondary | ICD-10-CM | POA: Diagnosis not present

## 2019-07-23 NOTE — Telephone Encounter (Signed)
Per Dr. Rayann Heman- have Pt hold warfarin for 2 days prior to procedure.  No INR check needed prior to procedure.  Notified Pt's wife.  Will send to coumadin clinic to reschedule next warfarin check.  Cancel appt 07/27/2019 per Greggory Brandy

## 2019-07-23 NOTE — Telephone Encounter (Signed)
Returned a call to Kurt Cross and she was aware that Dr. Rayann Heman stated Kurt Cross does not need an INR before his procedure. As instructed per the previous note rescheduled the pt's appt. Will have the pt come into the office 1 week post the procedure and the wife will find transportation or call back if need to move to another date.  Also, the wife stated she was still trying to get transport for Monday for pt's COVID testing and states she will call back to Sonia Baller if she can't on Monday.

## 2019-07-26 ENCOUNTER — Telehealth: Payer: Self-pay | Admitting: Internal Medicine

## 2019-07-26 ENCOUNTER — Other Ambulatory Visit (HOSPITAL_COMMUNITY)
Admission: RE | Admit: 2019-07-26 | Discharge: 2019-07-26 | Disposition: A | Discharge status: Home / Self Care | Payer: Medicare HMO | Source: Ambulatory Visit | Attending: Internal Medicine | Admitting: Internal Medicine

## 2019-07-26 DIAGNOSIS — Z20828 Contact with and (suspected) exposure to other viral communicable diseases: Secondary | ICD-10-CM | POA: Insufficient documentation

## 2019-07-26 DIAGNOSIS — Z01812 Encounter for preprocedural laboratory examination: Secondary | ICD-10-CM | POA: Insufficient documentation

## 2019-07-26 NOTE — Telephone Encounter (Signed)
Returned call to pt and he has been reminded that his arrival time for his procedure, 07/29/2019, is 11:30. Pt was grateful for the call.

## 2019-07-26 NOTE — Telephone Encounter (Signed)
Patient needs to know what time to be at his appt on 07/29/19 with cath lab. Patient needs to know today in order to set up transportation.

## 2019-07-27 DIAGNOSIS — N184 Chronic kidney disease, stage 4 (severe): Secondary | ICD-10-CM | POA: Diagnosis not present

## 2019-07-27 DIAGNOSIS — I130 Hypertensive heart and chronic kidney disease with heart failure and stage 1 through stage 4 chronic kidney disease, or unspecified chronic kidney disease: Secondary | ICD-10-CM | POA: Diagnosis not present

## 2019-07-27 DIAGNOSIS — E1140 Type 2 diabetes mellitus with diabetic neuropathy, unspecified: Secondary | ICD-10-CM | POA: Diagnosis not present

## 2019-07-27 DIAGNOSIS — I4891 Unspecified atrial fibrillation: Secondary | ICD-10-CM | POA: Diagnosis not present

## 2019-07-27 DIAGNOSIS — E1122 Type 2 diabetes mellitus with diabetic chronic kidney disease: Secondary | ICD-10-CM | POA: Diagnosis not present

## 2019-07-27 DIAGNOSIS — I5042 Chronic combined systolic (congestive) and diastolic (congestive) heart failure: Secondary | ICD-10-CM | POA: Diagnosis not present

## 2019-07-27 DIAGNOSIS — J9611 Chronic respiratory failure with hypoxia: Secondary | ICD-10-CM | POA: Diagnosis not present

## 2019-07-27 DIAGNOSIS — I4892 Unspecified atrial flutter: Secondary | ICD-10-CM | POA: Diagnosis not present

## 2019-07-27 DIAGNOSIS — M109 Gout, unspecified: Secondary | ICD-10-CM | POA: Diagnosis not present

## 2019-07-27 LAB — NOVEL CORONAVIRUS, NAA (HOSP ORDER, SEND-OUT TO REF LAB; TAT 18-24 HRS): SARS-CoV-2, NAA: NOT DETECTED

## 2019-07-28 ENCOUNTER — Telehealth: Payer: Self-pay | Admitting: Internal Medicine

## 2019-07-28 ENCOUNTER — Other Ambulatory Visit: Payer: Self-pay

## 2019-07-28 DIAGNOSIS — I130 Hypertensive heart and chronic kidney disease with heart failure and stage 1 through stage 4 chronic kidney disease, or unspecified chronic kidney disease: Secondary | ICD-10-CM | POA: Diagnosis not present

## 2019-07-28 DIAGNOSIS — J9611 Chronic respiratory failure with hypoxia: Secondary | ICD-10-CM | POA: Diagnosis not present

## 2019-07-28 DIAGNOSIS — I4891 Unspecified atrial fibrillation: Secondary | ICD-10-CM | POA: Diagnosis not present

## 2019-07-28 DIAGNOSIS — M109 Gout, unspecified: Secondary | ICD-10-CM | POA: Diagnosis not present

## 2019-07-28 DIAGNOSIS — I4892 Unspecified atrial flutter: Secondary | ICD-10-CM | POA: Diagnosis not present

## 2019-07-28 DIAGNOSIS — E1140 Type 2 diabetes mellitus with diabetic neuropathy, unspecified: Secondary | ICD-10-CM | POA: Diagnosis not present

## 2019-07-28 DIAGNOSIS — N184 Chronic kidney disease, stage 4 (severe): Secondary | ICD-10-CM | POA: Diagnosis not present

## 2019-07-28 DIAGNOSIS — I5042 Chronic combined systolic (congestive) and diastolic (congestive) heart failure: Secondary | ICD-10-CM | POA: Diagnosis not present

## 2019-07-28 DIAGNOSIS — E1122 Type 2 diabetes mellitus with diabetic chronic kidney disease: Secondary | ICD-10-CM | POA: Diagnosis not present

## 2019-07-28 NOTE — Telephone Encounter (Signed)
Call placed to Pt's wife.  Pt took dose of warfarin July 27, 2019 in the AM by mistake.  Discussed with Dr. Rayann Heman.  Advised would check INR upon arrival to short stay.  If INR <3 would proceed with gen change.  If >3 would need to reschedule.  Wife indicates understanding and would like to proceed.  Stat INR order placed.

## 2019-07-28 NOTE — Telephone Encounter (Addendum)
Returned call to pt and left message (tried calling both #s on file as well as # listed in below message). Per Sonia Baller, RN, pt is supposed to be holding warfarin for 2 days prior to his generator change tomorrow (10/13 phone note).

## 2019-07-28 NOTE — Patient Outreach (Signed)
[facility]) Care Management  07/28/2019  Kurt Cross 1950/12/19 [id number]   Telephone call to patient for follow up.  Spoke with wife Kurt Cross.  She reports that patient currently has the physical therapist working with him.  She reports that they just got started back.  Patient to have PPM battery changed on tomorrow.  Wife states she was not sure about his coumadin.  She states that she was to hold it for two days prior to procedure but the nurse was supposed to call to remind her but she had no heard anything.  Patient has had his coumadin yesterday and today.  Wife states that she will call Dr. Jackalyn Lombard office to notify. The therapist has reported that patient's power chair has been ordered.  Wife offers no other concerns but will call Dr. Jackalyn Lombard office after speaking with CM.  Plan: RN CM will outreach next week and wife agreeable.   [name], RN, MSN [place] [facility] Care Management Coordinator Direct Line [phone] Cell [phone] Toll Free: [phone]  Fax: [phone]

## 2019-07-28 NOTE — Telephone Encounter (Signed)
New Message:     Please call, pt is mixed up about how he is supposed to be taking his Coumadin. If no answer, please [phone].

## 2019-07-28 NOTE — Telephone Encounter (Signed)
Patient is having a cath done tomorrow and he took a coumadin pill yesterday by mistake.  He wants to know will that cause the procedure to be cancelled.

## 2019-07-29 ENCOUNTER — Ambulatory Visit (INDEPENDENT_AMBULATORY_CARE_PROVIDER_SITE_OTHER): Payer: Medicare HMO | Admitting: Pharmacist

## 2019-07-29 ENCOUNTER — Ambulatory Visit (HOSPITAL_COMMUNITY): Admission: RE | Admit: 2019-07-29 | Payer: Medicare HMO | Source: Home / Self Care | Admitting: Internal Medicine

## 2019-07-29 ENCOUNTER — Other Ambulatory Visit: Payer: Self-pay

## 2019-07-29 ENCOUNTER — Encounter (HOSPITAL_COMMUNITY): Admission: RE | Payer: Self-pay | Source: Home / Self Care

## 2019-07-29 DIAGNOSIS — Z5181 Encounter for therapeutic drug level monitoring: Secondary | ICD-10-CM

## 2019-07-29 DIAGNOSIS — I4821 Permanent atrial fibrillation: Secondary | ICD-10-CM

## 2019-07-29 DIAGNOSIS — R6889 Other general symptoms and signs: Secondary | ICD-10-CM | POA: Diagnosis not present

## 2019-07-29 LAB — POCT INR: INR: 3.2 — AB (ref 2.0–3.0)

## 2019-07-29 SURGERY — BIV PACEMAKER GENERATOR CHANGEOUT

## 2019-07-29 NOTE — Patient Instructions (Signed)
Description   INR too high for generator change, pt will take 1/2 tablet today, then continue taking 1 tablet daily except 1/2 tablet on Saturdays. Pt's wife is aware that Sonia Baller, RN will be following up to coordinate rescheduling generator change.

## 2019-07-30 ENCOUNTER — Telehealth: Payer: Self-pay

## 2019-07-30 ENCOUNTER — Other Ambulatory Visit: Payer: Self-pay | Admitting: Family Medicine

## 2019-07-30 DIAGNOSIS — I442 Atrioventricular block, complete: Secondary | ICD-10-CM

## 2019-07-30 DIAGNOSIS — I4891 Unspecified atrial fibrillation: Secondary | ICD-10-CM | POA: Diagnosis not present

## 2019-07-30 DIAGNOSIS — I5042 Chronic combined systolic (congestive) and diastolic (congestive) heart failure: Secondary | ICD-10-CM | POA: Diagnosis not present

## 2019-07-30 DIAGNOSIS — E1140 Type 2 diabetes mellitus with diabetic neuropathy, unspecified: Secondary | ICD-10-CM | POA: Diagnosis not present

## 2019-07-30 DIAGNOSIS — I4892 Unspecified atrial flutter: Secondary | ICD-10-CM | POA: Diagnosis not present

## 2019-07-30 DIAGNOSIS — I130 Hypertensive heart and chronic kidney disease with heart failure and stage 1 through stage 4 chronic kidney disease, or unspecified chronic kidney disease: Secondary | ICD-10-CM | POA: Diagnosis not present

## 2019-07-30 DIAGNOSIS — J9611 Chronic respiratory failure with hypoxia: Secondary | ICD-10-CM | POA: Diagnosis not present

## 2019-07-30 DIAGNOSIS — T82110D Breakdown (mechanical) of cardiac electrode, subsequent encounter: Secondary | ICD-10-CM

## 2019-07-30 DIAGNOSIS — E876 Hypokalemia: Secondary | ICD-10-CM

## 2019-07-30 DIAGNOSIS — E1122 Type 2 diabetes mellitus with diabetic chronic kidney disease: Secondary | ICD-10-CM | POA: Diagnosis not present

## 2019-07-30 DIAGNOSIS — N184 Chronic kidney disease, stage 4 (severe): Secondary | ICD-10-CM | POA: Diagnosis not present

## 2019-07-30 DIAGNOSIS — M109 Gout, unspecified: Secondary | ICD-10-CM | POA: Diagnosis not present

## 2019-07-30 MED ORDER — FUROSEMIDE 20 MG PO TABS: 20.0000 mg | ORAL_TABLET | Freq: Every day | ORAL | 0 refills | Status: DC

## 2019-07-30 NOTE — Telephone Encounter (Signed)
Requested medication (s) are due for refill today: yes  Requested medication (s) are on the active medication list: yes  Last refill:  07/13/2019  Future visit scheduled: no  Notes to clinic: last filled by historical provider    Requested Prescriptions  Pending Prescriptions Disp Refills   furosemide (LASIX) 20 MG tablet 30 tablet     Sig: Take 1 tablet (20 mg total) by mouth daily.     Cardiovascular:  Diuretics - Loop Failed - 07/30/2019  1:45 PM      Failed - K in normal range and within 360 days    Potassium  Date Value Ref Range Status  07/20/2019 2.6 (LL) 3.5 - 5.1 mEq/L Final         Failed - Cr in normal range and within 360 days    Creat  Date Value Ref Range Status  03/12/2019 2.44 (H) 0.70 - 1.25 mg/dL Final    Comment:    For patients >35 years of age, the reference limit for Creatinine is approximately 13% higher for people identified as African-American. .    Creatinine, Ser  Date Value Ref Range Status  07/20/2019 2.45 (H) 0.40 - 1.50 mg/dL Final         Passed - Ca in normal range and within 360 days    Calcium  Date Value Ref Range Status  07/20/2019 9.3 8.4 - 10.5 mg/dL Final         Passed - Na in normal range and within 360 days    Sodium  Date Value Ref Range Status  07/20/2019 137 135 - 145 mEq/L Final  03/18/2019 139 134 - 144 mmol/L Final         Passed - Last BP in normal range    BP Readings from Last 1 Encounters:  07/20/19 110/60         Passed - Valid encounter within last 6 months    Recent Outpatient Visits          1 week ago Gouty arthritis   [facility] [place], Mortimer Fries, MD   4 months ago Acute on chronic congestive heart failure, unspecified heart failure type [facility])   [facility] [place], Mortimer Fries, MD   7 months ago Diabetes mellitus, insulin dependent (IDDM), uncontrolled ([place])   [facility] [place], Mortimer Fries, MD   11 months ago  Chronic combined systolic and diastolic CHF (congestive heart failure) [facility])   [facility], Mortimer Fries, MD   11 months ago Chronic combined systolic and diastolic CHF (congestive heart failure) [facility])   [facility], Mortimer Fries, MD

## 2019-07-30 NOTE — Telephone Encounter (Signed)
Medication Refill - Medication: furosemide (LASIX) 20 MG tablet  Has the patient contacted their pharmacy? yes (Agent: If no, request that the patient contact the pharmacy for the refill.) (Agent: If yes, when and what did the pharmacy advise?)  Preferred Pharmacy (with phone number or street name):  [facility], [place] [phone] (Phone) [phone] (Fax)   Agent: Please be advised that RX refills may take up to 3 business days. We ask that you follow-up with your pharmacy.

## 2019-07-30 NOTE — Telephone Encounter (Signed)
-----   Message from Leeroy Bock, [facility] sent at 07/29/2019 12:59 PM EDT ----- INR was too high for generator change today - procedure has been canceled but I advised pt and his wife that you would be reaching out to help reschedule it. Thank you!

## 2019-07-30 NOTE — Telephone Encounter (Signed)
Call placed to wife.  Pt tentatively rescheduled for 11/12 at 1:30 pm.  Wife is driving and can't see calendar.  Will touch base with wife on Monday to confirm.  Will get labs at HF appt on November 4  Will need to reschedule covid test

## 2019-08-04 ENCOUNTER — Other Ambulatory Visit: Payer: Self-pay

## 2019-08-04 DIAGNOSIS — I5042 Chronic combined systolic (congestive) and diastolic (congestive) heart failure: Secondary | ICD-10-CM | POA: Diagnosis not present

## 2019-08-04 DIAGNOSIS — E1140 Type 2 diabetes mellitus with diabetic neuropathy, unspecified: Secondary | ICD-10-CM | POA: Diagnosis not present

## 2019-08-04 DIAGNOSIS — I4891 Unspecified atrial fibrillation: Secondary | ICD-10-CM | POA: Diagnosis not present

## 2019-08-04 DIAGNOSIS — I4892 Unspecified atrial flutter: Secondary | ICD-10-CM | POA: Diagnosis not present

## 2019-08-04 DIAGNOSIS — N184 Chronic kidney disease, stage 4 (severe): Secondary | ICD-10-CM | POA: Diagnosis not present

## 2019-08-04 DIAGNOSIS — J9611 Chronic respiratory failure with hypoxia: Secondary | ICD-10-CM | POA: Diagnosis not present

## 2019-08-04 DIAGNOSIS — M109 Gout, unspecified: Secondary | ICD-10-CM | POA: Diagnosis not present

## 2019-08-04 DIAGNOSIS — E1122 Type 2 diabetes mellitus with diabetic chronic kidney disease: Secondary | ICD-10-CM | POA: Diagnosis not present

## 2019-08-04 DIAGNOSIS — I130 Hypertensive heart and chronic kidney disease with heart failure and stage 1 through stage 4 chronic kidney disease, or unspecified chronic kidney disease: Secondary | ICD-10-CM | POA: Diagnosis not present

## 2019-08-04 NOTE — Patient Outreach (Signed)
[facility]) Care Management  08/04/2019  Kurt Cross Mar 24, 1951 [id number]   Telephone call to patient for follow up.  Spoke with spouse Kurt Cross who is handling patient care needs presently. She acknowledges the PPM device change rescheduled due to PT, INR levels.  Patient goes for repeat levels on tomorrow. She states that gentleman for patient power chair came by today but she did not speak with him but patient did. She is hopeful that patient will get chair in order to increase mobility.  She states patient's goal is to be more mobile and involved with his grandchildren.  They are managing patient daily care and medications at this time and offer no concerns today.  Plan: RN CM will outreach next week and wife agreeable.    Kurt Baseman, RN, MSN [place] Management Care Management Coordinator Direct Line [phone] Cell [phone] Toll Free: [phone]  Fax: [phone]

## 2019-08-05 ENCOUNTER — Other Ambulatory Visit: Payer: Self-pay

## 2019-08-05 ENCOUNTER — Ambulatory Visit (INDEPENDENT_AMBULATORY_CARE_PROVIDER_SITE_OTHER): Payer: Medicare HMO | Admitting: *Deleted

## 2019-08-05 DIAGNOSIS — I4821 Permanent atrial fibrillation: Secondary | ICD-10-CM | POA: Diagnosis not present

## 2019-08-05 DIAGNOSIS — Z5181 Encounter for therapeutic drug level monitoring: Secondary | ICD-10-CM

## 2019-08-05 LAB — POCT INR: INR: 3 (ref 2.0–3.0)

## 2019-08-05 NOTE — Patient Instructions (Addendum)
Description   Continue to take 1 tablet daily. Hold Coumadin 11/10 and 11/11. Recheck INR on 11/9. Call coumadin clinic [phone] for any questions or upcoming procedures.

## 2019-08-06 DIAGNOSIS — I130 Hypertensive heart and chronic kidney disease with heart failure and stage 1 through stage 4 chronic kidney disease, or unspecified chronic kidney disease: Secondary | ICD-10-CM | POA: Diagnosis not present

## 2019-08-06 DIAGNOSIS — I4891 Unspecified atrial fibrillation: Secondary | ICD-10-CM | POA: Diagnosis not present

## 2019-08-06 DIAGNOSIS — E1122 Type 2 diabetes mellitus with diabetic chronic kidney disease: Secondary | ICD-10-CM | POA: Diagnosis not present

## 2019-08-06 DIAGNOSIS — E1140 Type 2 diabetes mellitus with diabetic neuropathy, unspecified: Secondary | ICD-10-CM | POA: Diagnosis not present

## 2019-08-06 DIAGNOSIS — J9611 Chronic respiratory failure with hypoxia: Secondary | ICD-10-CM | POA: Diagnosis not present

## 2019-08-06 DIAGNOSIS — N184 Chronic kidney disease, stage 4 (severe): Secondary | ICD-10-CM | POA: Diagnosis not present

## 2019-08-06 DIAGNOSIS — M109 Gout, unspecified: Secondary | ICD-10-CM | POA: Diagnosis not present

## 2019-08-06 DIAGNOSIS — I4892 Unspecified atrial flutter: Secondary | ICD-10-CM | POA: Diagnosis not present

## 2019-08-06 DIAGNOSIS — I5042 Chronic combined systolic (congestive) and diastolic (congestive) heart failure: Secondary | ICD-10-CM | POA: Diagnosis not present

## 2019-08-10 NOTE — Telephone Encounter (Signed)
Call placed to wife.  Discussed rescheduled labs/covid test.  Gave verbal instruction for rescheduled gen change.  Pt will pick up revised instructions on 11/9-will get labs and covid test same day  Work up complete.

## 2019-08-11 ENCOUNTER — Telehealth (HOSPITAL_COMMUNITY): Payer: Self-pay

## 2019-08-11 ENCOUNTER — Other Ambulatory Visit: Payer: Self-pay

## 2019-08-11 ENCOUNTER — Encounter (HOSPITAL_COMMUNITY): Payer: Self-pay | Admitting: Cardiology

## 2019-08-11 ENCOUNTER — Ambulatory Visit (HOSPITAL_COMMUNITY)
Admission: RE | Admit: 2019-08-11 | Discharge: 2019-08-11 | Disposition: A | Discharge status: Home / Self Care | Payer: Medicare HMO | Source: Ambulatory Visit | Attending: Cardiology | Admitting: Cardiology

## 2019-08-11 VITALS — BP 120/80 | HR 72

## 2019-08-11 DIAGNOSIS — Z794 Long term (current) use of insulin: Secondary | ICD-10-CM | POA: Diagnosis not present

## 2019-08-11 DIAGNOSIS — I712 Thoracic aortic aneurysm, without rupture: Secondary | ICD-10-CM | POA: Insufficient documentation

## 2019-08-11 DIAGNOSIS — Z833 Family history of diabetes mellitus: Secondary | ICD-10-CM | POA: Diagnosis not present

## 2019-08-11 DIAGNOSIS — I5042 Chronic combined systolic (congestive) and diastolic (congestive) heart failure: Secondary | ICD-10-CM | POA: Diagnosis not present

## 2019-08-11 DIAGNOSIS — I428 Other cardiomyopathies: Secondary | ICD-10-CM | POA: Diagnosis not present

## 2019-08-11 DIAGNOSIS — Z87891 Personal history of nicotine dependence: Secondary | ICD-10-CM | POA: Diagnosis not present

## 2019-08-11 DIAGNOSIS — M109 Gout, unspecified: Secondary | ICD-10-CM | POA: Insufficient documentation

## 2019-08-11 DIAGNOSIS — E669 Obesity, unspecified: Secondary | ICD-10-CM | POA: Diagnosis not present

## 2019-08-11 DIAGNOSIS — I2510 Atherosclerotic heart disease of native coronary artery without angina pectoris: Secondary | ICD-10-CM | POA: Diagnosis not present

## 2019-08-11 DIAGNOSIS — N183 Chronic kidney disease, stage 3 unspecified: Secondary | ICD-10-CM | POA: Diagnosis not present

## 2019-08-11 DIAGNOSIS — E876 Hypokalemia: Secondary | ICD-10-CM | POA: Diagnosis not present

## 2019-08-11 DIAGNOSIS — I442 Atrioventricular block, complete: Secondary | ICD-10-CM | POA: Diagnosis not present

## 2019-08-11 DIAGNOSIS — E1122 Type 2 diabetes mellitus with diabetic chronic kidney disease: Secondary | ICD-10-CM | POA: Diagnosis not present

## 2019-08-11 DIAGNOSIS — Z79899 Other long term (current) drug therapy: Secondary | ICD-10-CM | POA: Insufficient documentation

## 2019-08-11 DIAGNOSIS — I5022 Chronic systolic (congestive) heart failure: Secondary | ICD-10-CM | POA: Diagnosis not present

## 2019-08-11 DIAGNOSIS — Z7901 Long term (current) use of anticoagulants: Secondary | ICD-10-CM | POA: Diagnosis not present

## 2019-08-11 DIAGNOSIS — Z950 Presence of cardiac pacemaker: Secondary | ICD-10-CM | POA: Insufficient documentation

## 2019-08-11 DIAGNOSIS — G4733 Obstructive sleep apnea (adult) (pediatric): Secondary | ICD-10-CM | POA: Diagnosis not present

## 2019-08-11 DIAGNOSIS — I4821 Permanent atrial fibrillation: Secondary | ICD-10-CM

## 2019-08-11 LAB — BASIC METABOLIC PANEL
Anion gap: 14 (ref 5–15)
BUN: 46 mg/dL — ABNORMAL HIGH (ref 8–23)
CO2: 35 mmol/L — ABNORMAL HIGH (ref 22–32)
Calcium: 8.7 mg/dL — ABNORMAL LOW (ref 8.9–10.3)
Chloride: 85 mmol/L — ABNORMAL LOW (ref 98–111)
Creatinine, Ser: 2.8 mg/dL — ABNORMAL HIGH (ref 0.61–1.24)
GFR calc Af Amer: 26 mL/min — ABNORMAL LOW (ref >60)
GFR calc non Af Amer: 22 mL/min — ABNORMAL LOW (ref >60)
Glucose, Bld: 192 mg/dL — ABNORMAL HIGH (ref 70–99)
Potassium: 2.3 mmol/L — CL (ref 3.5–5.1)
Sodium: 134 mmol/L — ABNORMAL LOW (ref 135–145)

## 2019-08-11 MED ORDER — ISOSORB DINITRATE-HYDRALAZINE 20-37.5 MG PO TABS: 0.5000 | ORAL_TABLET | Freq: Three times a day (TID) | ORAL | 6 refills | Status: AC

## 2019-08-11 MED ORDER — TORSEMIDE 20 MG PO TABS: 40.0000 mg | ORAL_TABLET | Freq: Every day | ORAL | 3 refills | Status: DC

## 2019-08-11 MED ORDER — POTASSIUM CHLORIDE CRYS ER 20 MEQ PO TBCR: 20.0000 meq | EXTENDED_RELEASE_TABLET | Freq: Every day | ORAL | 6 refills | Status: DC

## 2019-08-11 NOTE — Progress Notes (Signed)
Spoke with patients wife, provided all avs instructions. Verbalized understanding.

## 2019-08-11 NOTE — Patient Instructions (Addendum)
STOP Lasix (Furosemide)  STOP Metolazone  START Torsemide 40mg  (2 tabs) daily  START Potassium 20 meq (1 tab) daily  START Bidil 1/2 tab THREE TIMES A DAY  You have been referred Dr Radford Pax for CPAP.  Her office will call you to schedule an appointment.   Labs today and repeat in 10 days We will only contact you if something comes back abnormal or we need to make some changes. Otherwise no news is good news!  Your physician recommends that you schedule a follow-up appointment in: 3 weeks with the Nurse Practioner   At the [facility], you and your health needs are our priority. As part of our continuing mission to provide you with exceptional heart care, we have created designated Provider Care Teams. These Care Teams include your primary Cardiologist (physician) and Advanced Practice Providers (APPs- Physician Assistants and Nurse Practitioners) who all work together to provide you with the care you need, when you need it.   You may see any of the following providers on your designated Care Team at your next follow up: [name] Kitchen Dr Glori Bickers . Dr Loralie Champagne . Darrick Grinder, NP . Lyda Jester, PA   Please be sure to bring in all your medications bottles to every appointment.

## 2019-08-11 NOTE — Telephone Encounter (Signed)
Received a call from the lab stating that the patients current potassium level was 2.3. I advised Dr. Aundra Dubin of the abnormal lab result and he instructed that the patient take 90meq of potassium today and 19meq starting tomorrow until further notice.  Patient was contacted and made aware of changes. Patient verbalized understanding.

## 2019-08-12 ENCOUNTER — Other Ambulatory Visit (HOSPITAL_COMMUNITY): Payer: Self-pay

## 2019-08-12 ENCOUNTER — Other Ambulatory Visit: Payer: Self-pay

## 2019-08-12 ENCOUNTER — Ambulatory Visit: Payer: Medicare HMO

## 2019-08-12 DIAGNOSIS — E876 Hypokalemia: Secondary | ICD-10-CM

## 2019-08-12 MED ORDER — POTASSIUM CHLORIDE CRYS ER 20 MEQ PO TBCR: 40.0000 meq | EXTENDED_RELEASE_TABLET | Freq: Every day | ORAL | 1 refills | Status: DC

## 2019-08-12 NOTE — Progress Notes (Signed)
PCP: [name], MD HF Cardiology: Dr. Aundra Dubin  68 y.o. with history of permanent atrial fibrillation, Nonischemic cardiomyopathy, and complete heart block was referred by Dr. Rayann Heman for CHF evaluation. He has a long history of nonischemic cardiomyopathy, cath in 2015 showed nonobstructive CAD.  Most recent echo in 4/20 showed EF 35-40%.  He has a Medtronic CRT-P device implanted initially for complete heart block. He has a ventral incisional hernia that has caused a lot of problems and has required multiple surgeries.   Patient is not very active.  Since his hernia surgeries, he has remained essentially wheelchair-bound. He is working with PT.  No dyspnea wheeling his chair or with transfers.  No chest pain, no orthopnea/PND.  He has been diagnosed with OSA in the past but is not on CPAP.  He is on an unusual diuretic regimen with Lasix 20 mg daily and metolazone 2.5 mg daily.  He has not been taking KCl, this is reflective in recent hypokalemia.   Medtronic device interrogation: recent volume overload but thoracic impedance has stabilized.  BiV pacing percentage fluctuates.  He appears to be always in atrial fibrillation.   ECG (personally reviewed): atypical atrial flutter with BiV pacing  Labs (10/20): LDL 58, HDL 21, K 2.6, creatinine 2.45   PMH: 1. Permanent atrial fibrillation.  2. Ascending aortic aneurysm: 4.5 cm on 10/19 CTA.  3. Ventral incisional hernia: Has required multiple surgeries.  4. Obesity 5. CKD: Stage 3.  6. Gout  7. Type 2 diabetes 8. Chronic systolic CHF: Nonischemic cardiomyopathy with Medtronic CRT-P.  - LHC (2015): Nonobstructive CAD.  - Echo (2017): EF 30-35% - Echo (4/20): EF 35-40%, moderate LVH, mildly increased RV size with normal systolic function.  9. Complete heart block: MDT CRT-P device.   Social History   Socioeconomic History  . Marital status: Married    Spouse name: Not on file  . Number of children: Not on file  . Years of  education: Not on file  . Highest education level: Not on file  Occupational History  . Not on file  Social Needs  . Financial resource strain: Not on file  . Food insecurity    Worry: Never true    Inability: Never true  . Transportation needs    Medical: No    Non-medical: No  Tobacco Use  . Smoking status: Former Smoker    Packs/day: 1.00    Years: 40.00    Pack years: 40.00    Types: Cigarettes    Quit date: 08/21/2012    Years since quitting: 6.9  . Smokeless tobacco: Never Used  Substance and Sexual Activity  . Alcohol use: Not Currently    Alcohol/week: 0.0 standard drinks    Comment: occasional  . Drug use: No  . Sexual activity: Not on file  Lifestyle  . Physical activity    Days per week: Not on file    Minutes per session: Not on file  . Stress: Not on file  Relationships  . Social Herbalist on phone: Not on file    Gets together: Not on file    Attends religious service: Not on file    Active member of club or organization: Not on file    Attends meetings of clubs or organizations: Not on file    Relationship status: Not on file  . Intimate partner violence    Fear of current or ex partner: Not on file    Emotionally abused: Not on  file    Physically abused: Not on file    Forced sexual activity: Not on file  Other Topics Concern  . Not on file  Social History Narrative   Lives at [place] since 02/06/16   Married -[name]   Former smoker -stopped 2013   No Advance Directives   Family History  Problem Relation Age of Onset  . Diabetes Mother   . Alcohol abuse Mother   . Alcoholism Father   . Heart Problems Maternal Grandmother    ROS: All systems reviewed and negative except as per HPI.   Current Outpatient Medications  Medication Sig Dispense Refill  . Accu-Chek Softclix Lancets lancets Use to test blood sugars 1-2 times daily. 100 each 12  . Blood Glucose Monitoring Suppl (ACCU-CHEK AVIVA) device Use to test blood sugars 1-2  times daily. 1 each 0  . colchicine 0.6 MG tablet Take 1 tablet (0.6 mg total) by mouth daily. Pt states he is taking 1/2 tablet daily ( since 03/11/2019) (Patient not taking: Reported on 08/12/2019) 90 tablet 2  . gabapentin (NEURONTIN) 300 MG capsule Take 1 capsule (300 mg total) by mouth 3 (three) times daily. 270 capsule 1  . glucose blood (ACCU-CHEK AVIVA PLUS) test strip Use to test blood sugars 1-2 times daily. 100 each 12  . insulin NPH Human (NOVOLIN N) 100 UNIT/ML injection Inject 5-20 Units into the skin 2 (two) times daily before a meal.     . insulin regular (NOVOLIN R) 100 units/mL injection Use with sliding scale. (Patient taking differently: Inject 5-20 Units into the skin 2 (two) times daily before a meal. ) 10 mL 11  . rosuvastatin (CRESTOR) 10 MG tablet Take 1 tablet (10 mg total) by mouth daily. 90 tablet 1  . traZODone (DESYREL) 50 MG tablet Take 2 tablets (100 mg total) by mouth at bedtime as needed for sleep. 90 tablet 1  . isosorbide-hydrALAZINE (BIDIL) 20-37.5 MG tablet Take 0.5 tablets by mouth 3 (three) times daily. 45 tablet 6  . potassium chloride SA (KLOR-CON) 20 MEQ tablet Take 2 tablets (40 mEq total) by mouth daily. Please cancel all previous orders for current medication. Change in dosage or pill size. 180 tablet 1  . torsemide (DEMADEX) 20 MG tablet Take 2 tablets (40 mg total) by mouth daily. 180 tablet 3   No current facility-administered medications for this encounter.    BP 120/80   Pulse 72   SpO2 98%  General: NAD, obese Neck: JVP 8-9 cm no thyromegaly or thyroid nodule.  Lungs: Clear to auscultation bilaterally with normal respiratory effort. CV: Nondisplaced PMI.  Heart regular S1/S2, no S3/S4, no murmur.  1+ ankle edema.  No carotid bruit.  Unable to palpate pedal pulses.  Abdomen: Soft, nontender, no hepatosplenomegaly, no distention.  Skin: Intact without lesions or rashes.  Neurologic: Alert and oriented x 3.  Psych: Normal affect. Extremities: No  clubbing or cyanosis.  HEENT: Normal.   Assessment/Plan:  1. Chronic systolic CHF: Nonischemic cardiomyopathy by cath in 2015.  Last echo in 4/20 with EF 35-40%, moderate LVH.  He has a Medtronic CRT-P device. He is minimally active but does not have much dyspnea, NYHA class II probably. On exam, he is mildly volume overloaded.  He is on an unusual diuretic regimen with Lasix 20 mg daily and metolazone 2.5 daily.  - Stop Lasix and metolazone.  - Start torsemide 40 mg daily.  I will have him take KCl 20 daily for now.  BMET today and  repeat in 10 days.  - Continue Coreg 25 mg bid.  - Add Bidil 1/2 tab tid.  2. OSA: Needs to get on CPAP, has diagnosed OSA.  I will refer to Dr. Radford Pax for sleep medicine evaluation.  3. Complete heart block: MDT CRT-P device.  4. Atrial fibrillation: Permanent. He is on warfarin.  5. CKD Stage 3: BMET today.  6. Ascending aortic aneurysm: Will need to arrange noncontrast CT chest at next appt to follow.  7. Deconditioning: Needs more activity, continue to work with PT.  I will arrange for him to be followed by paramedicine.  Followup with NP in 3 wks.   Loralie Champagne 08/12/2019

## 2019-08-12 NOTE — Patient Outreach (Signed)
[facility] [facility]) Care Management  08/12/2019  Kurt Cross September 13, 1951 [id number]   Telephone call to wife [name].  No answer at home number.  Called cell phone.  HIPAA compliant voice message left.    Plan: RN CM will attempt patient again within 4 business days and send letter.   [name], RN, MSN [place] [facility] Care Management Coordinator Direct Line [phone] Cell [phone] Toll Free: [phone]  Fax: [phone]

## 2019-08-13 ENCOUNTER — Other Ambulatory Visit: Payer: Self-pay

## 2019-08-13 NOTE — Patient Outreach (Signed)
[facility] [facility]) Care Management  08/13/2019  SRULY BRESSI 07-31-1951 [id number]   Telephone call to wife for follow up.  No answer.  Unable to leave a message.  Plan: RN CM will attempt patient again within 4 business days.  [name], RN, MSN [place] [facility] Care Management Coordinator Direct Line [phone] Cell [phone] Toll Free: [phone]  Fax: [phone]

## 2019-08-16 ENCOUNTER — Other Ambulatory Visit: Payer: Self-pay

## 2019-08-16 ENCOUNTER — Inpatient Hospital Stay (HOSPITAL_COMMUNITY): Admission: RE | Admit: 2019-08-16 | Payer: Medicare HMO | Source: Ambulatory Visit

## 2019-08-16 ENCOUNTER — Ambulatory Visit (INDEPENDENT_AMBULATORY_CARE_PROVIDER_SITE_OTHER): Payer: Medicare HMO | Admitting: *Deleted

## 2019-08-16 ENCOUNTER — Other Ambulatory Visit: Payer: Medicare HMO | Admitting: *Deleted

## 2019-08-16 DIAGNOSIS — I442 Atrioventricular block, complete: Secondary | ICD-10-CM | POA: Diagnosis not present

## 2019-08-16 DIAGNOSIS — Z5181 Encounter for therapeutic drug level monitoring: Secondary | ICD-10-CM | POA: Diagnosis not present

## 2019-08-16 DIAGNOSIS — T82110D Breakdown (mechanical) of cardiac electrode, subsequent encounter: Secondary | ICD-10-CM | POA: Diagnosis not present

## 2019-08-16 DIAGNOSIS — I4821 Permanent atrial fibrillation: Secondary | ICD-10-CM

## 2019-08-16 LAB — POCT INR: INR: 2.1 (ref 2.0–3.0)

## 2019-08-16 NOTE — Patient Instructions (Signed)
Description    Hold Coumadin 11/10 and 11/11. Resume Coumadin the evening of the procedure and take an extra 1/2 tablet unless the Dr. says otherwise. Resume your normal dose 1 tablet daily except for 1/2 a tablet on Saturday. Call coumadin clinic [phone] for any questions or upcoming procedures.

## 2019-08-17 ENCOUNTER — Other Ambulatory Visit: Payer: Self-pay

## 2019-08-17 ENCOUNTER — Other Ambulatory Visit (HOSPITAL_COMMUNITY)
Admission: RE | Admit: 2019-08-17 | Discharge: 2019-08-17 | Disposition: A | Discharge status: Home / Self Care | Payer: Medicare HMO | Source: Ambulatory Visit | Attending: Internal Medicine | Admitting: Internal Medicine

## 2019-08-17 ENCOUNTER — Telehealth: Payer: Self-pay

## 2019-08-17 ENCOUNTER — Telehealth: Payer: Self-pay | Admitting: Internal Medicine

## 2019-08-17 DIAGNOSIS — Z20828 Contact with and (suspected) exposure to other viral communicable diseases: Secondary | ICD-10-CM | POA: Insufficient documentation

## 2019-08-17 DIAGNOSIS — Z01812 Encounter for preprocedural laboratory examination: Secondary | ICD-10-CM | POA: Insufficient documentation

## 2019-08-17 LAB — CBC WITH DIFFERENTIAL/PLATELET
Basophils Absolute: 0.1 10*3/uL (ref 0.0–0.2)
Basos: 0 %
EOS (ABSOLUTE): 0.1 10*3/uL (ref 0.0–0.4)
Eos: 1 %
Hematocrit: 41.2 % (ref 37.5–51.0)
Hemoglobin: 14.4 g/dL (ref 13.0–17.7)
Immature Grans (Abs): 0 10*3/uL (ref 0.0–0.1)
Immature Granulocytes: 0 %
Lymphocytes Absolute: 1.5 10*3/uL (ref 0.7–3.1)
Lymphs: 13 %
MCH: 31.4 pg (ref 26.6–33.0)
MCHC: 35 g/dL (ref 31.5–35.7)
MCV: 90 fL (ref 79–97)
Monocytes Absolute: 1 10*3/uL — ABNORMAL HIGH (ref 0.1–0.9)
Monocytes: 8 %
Neutrophils Absolute: 9.4 10*3/uL — ABNORMAL HIGH (ref 1.4–7.0)
Neutrophils: 78 %
Platelets: 252 10*3/uL (ref 150–450)
RBC: 4.59 x10E6/uL (ref 4.14–5.80)
RDW: 13.6 % (ref 11.6–15.4)
WBC: 12 10*3/uL — ABNORMAL HIGH (ref 3.4–10.8)

## 2019-08-17 LAB — BASIC METABOLIC PANEL
BUN/Creatinine Ratio: 17 (ref 10–24)
BUN: 45 mg/dL — ABNORMAL HIGH (ref 8–27)
CO2: 34 mmol/L — ABNORMAL HIGH (ref 20–29)
Calcium: 8.5 mg/dL — ABNORMAL LOW (ref 8.6–10.2)
Chloride: 88 mmol/L — ABNORMAL LOW (ref 96–106)
Creatinine, Ser: 2.6 mg/dL — ABNORMAL HIGH (ref 0.76–1.27)
GFR calc Af Amer: 28 mL/min/{1.73_m2} — ABNORMAL LOW (ref >59)
GFR calc non Af Amer: 25 mL/min/{1.73_m2} — ABNORMAL LOW (ref >59)
Glucose: 187 mg/dL — ABNORMAL HIGH (ref 65–99)
Potassium: 2.6 mmol/L — CL (ref 3.5–5.2)
Sodium: 140 mmol/L (ref 134–144)

## 2019-08-17 LAB — SARS CORONAVIRUS 2 (TAT 6-24 HRS): SARS Coronavirus 2: NEGATIVE

## 2019-08-17 NOTE — Patient Outreach (Signed)
[place] [facility]) Care Management  08/17/2019  Kurt Cross 21-Dec-1950 [id number]   Telephone call to spouse.  Discussed recent lab for potassium.  Advised her to call MD office back to get correct information on dosing of increased potassium.  She states she will call. She states that patient is doing ok and that they are managing. She states patient chair would not be ready for 2 months.  Discussed that chairs are custom made and take some time.  She verbalized understanding. She states patient still on track to get pacemaker battery change this week and is out getting pre-admit COVID testing. Discussed patient upcoming procedure and heart failure.  She offers no concerns.     Plan: RN CM will attempt patient again in the next two weeks and wife is agreeable.  [name], RN, MSN [place] [facility] Care Management Coordinator Direct Line [phone] Cell [phone] Toll Free: [phone]  Fax: [phone]

## 2019-08-17 NOTE — Telephone Encounter (Signed)
K is still low.  Take 60 mEq extra K in addition to what he is already taking today.  Tomorrow and going forwards, increase his total daily K by 40 mEq.  Repeat BMET on Friday morning.

## 2019-08-17 NOTE — Telephone Encounter (Signed)
No answer left vm.  

## 2019-08-17 NOTE — Telephone Encounter (Signed)
Left detailed message for wife.  Advised to call this nurse ASAP to reschedule covid test.

## 2019-08-17 NOTE — Telephone Encounter (Signed)
Call back received from wife.  Pt will go for covid test today at 1:00 pm.  Notified wife that Pt will need to come in earlier on Thursday for procedure.  Pt will arrive at 9:30 am for 11:30 procedure on Thursday.  Confirmed Pt is holding warfarin today and tomorrow prior to procedure.

## 2019-08-17 NOTE — Telephone Encounter (Signed)
Cardiology Moonlighter Note  Contacted by [facility] for critical lab value, K 2.6. This seems to be a chronic problem for this patient with K's frequently in the 2's dating back prior to 2019. He is prescribed a potassium supplement. This note will be sent to the patient's cardiologists Drs Radford Pax and Allred to determine next steps in management.   Marcie Mowers, MD Cardiology Fellow, PGY-7

## 2019-08-17 NOTE — Telephone Encounter (Signed)
I have never seen this patient - this needs to be addressed today by Dr. Aundra Dubin

## 2019-08-18 ENCOUNTER — Other Ambulatory Visit: Payer: Self-pay

## 2019-08-18 ENCOUNTER — Emergency Department (HOSPITAL_COMMUNITY)
Admission: EM | Admit: 2019-08-18 | Discharge: 2019-08-19 | Disposition: A | Discharge status: Home / Self Care | Payer: Medicare HMO | Source: Home / Self Care | Attending: Emergency Medicine | Admitting: Emergency Medicine

## 2019-08-18 ENCOUNTER — Ambulatory Visit: Payer: Self-pay

## 2019-08-18 DIAGNOSIS — Z45010 Encounter for checking and testing of cardiac pacemaker pulse generator [battery]: Secondary | ICD-10-CM | POA: Diagnosis not present

## 2019-08-18 DIAGNOSIS — Z6841 Body Mass Index (BMI) 40.0 and over, adult: Secondary | ICD-10-CM | POA: Diagnosis not present

## 2019-08-18 DIAGNOSIS — M109 Gout, unspecified: Secondary | ICD-10-CM

## 2019-08-18 DIAGNOSIS — I428 Other cardiomyopathies: Secondary | ICD-10-CM | POA: Diagnosis not present

## 2019-08-18 DIAGNOSIS — R0902 Hypoxemia: Secondary | ICD-10-CM | POA: Diagnosis not present

## 2019-08-18 DIAGNOSIS — Z7901 Long term (current) use of anticoagulants: Secondary | ICD-10-CM | POA: Diagnosis not present

## 2019-08-18 DIAGNOSIS — N183 Chronic kidney disease, stage 3 unspecified: Secondary | ICD-10-CM | POA: Diagnosis not present

## 2019-08-18 DIAGNOSIS — N1832 Chronic kidney disease, stage 3b: Secondary | ICD-10-CM | POA: Insufficient documentation

## 2019-08-18 DIAGNOSIS — M1009 Idiopathic gout, multiple sites: Secondary | ICD-10-CM | POA: Diagnosis not present

## 2019-08-18 DIAGNOSIS — K439 Ventral hernia without obstruction or gangrene: Secondary | ICD-10-CM | POA: Diagnosis not present

## 2019-08-18 DIAGNOSIS — I509 Heart failure, unspecified: Secondary | ICD-10-CM | POA: Insufficient documentation

## 2019-08-18 DIAGNOSIS — I10 Essential (primary) hypertension: Secondary | ICD-10-CM | POA: Diagnosis not present

## 2019-08-18 DIAGNOSIS — I5022 Chronic systolic (congestive) heart failure: Secondary | ICD-10-CM | POA: Diagnosis not present

## 2019-08-18 DIAGNOSIS — G4733 Obstructive sleep apnea (adult) (pediatric): Secondary | ICD-10-CM | POA: Diagnosis not present

## 2019-08-18 DIAGNOSIS — R5381 Other malaise: Secondary | ICD-10-CM | POA: Diagnosis not present

## 2019-08-18 DIAGNOSIS — Z87891 Personal history of nicotine dependence: Secondary | ICD-10-CM | POA: Insufficient documentation

## 2019-08-18 DIAGNOSIS — R52 Pain, unspecified: Secondary | ICD-10-CM | POA: Diagnosis not present

## 2019-08-18 DIAGNOSIS — Z794 Long term (current) use of insulin: Secondary | ICD-10-CM | POA: Insufficient documentation

## 2019-08-18 DIAGNOSIS — I714 Abdominal aortic aneurysm, without rupture: Secondary | ICD-10-CM | POA: Diagnosis not present

## 2019-08-18 DIAGNOSIS — E785 Hyperlipidemia, unspecified: Secondary | ICD-10-CM | POA: Diagnosis not present

## 2019-08-18 DIAGNOSIS — Z950 Presence of cardiac pacemaker: Secondary | ICD-10-CM | POA: Insufficient documentation

## 2019-08-18 DIAGNOSIS — I4819 Other persistent atrial fibrillation: Secondary | ICD-10-CM | POA: Diagnosis not present

## 2019-08-18 DIAGNOSIS — E1122 Type 2 diabetes mellitus with diabetic chronic kidney disease: Secondary | ICD-10-CM | POA: Insufficient documentation

## 2019-08-18 DIAGNOSIS — E876 Hypokalemia: Secondary | ICD-10-CM | POA: Diagnosis not present

## 2019-08-18 DIAGNOSIS — Z79899 Other long term (current) drug therapy: Secondary | ICD-10-CM | POA: Insufficient documentation

## 2019-08-18 DIAGNOSIS — I130 Hypertensive heart and chronic kidney disease with heart failure and stage 1 through stage 4 chronic kidney disease, or unspecified chronic kidney disease: Secondary | ICD-10-CM | POA: Insufficient documentation

## 2019-08-18 LAB — CBG MONITORING, ED: Glucose-Capillary: 265 mg/dL — ABNORMAL HIGH (ref 70–99)

## 2019-08-18 MED ORDER — COLCHICINE 0.3 MG HALF TABLET
0.3000 mg | ORAL_TABLET | Freq: Every day | ORAL | Status: DC
  Administered 2019-08-18: 22:00:00 0.3 mg via ORAL
  Filled 2019-08-18: qty 1

## 2019-08-18 MED ORDER — HYDROCODONE-ACETAMINOPHEN 5-325 MG PO TABS
1.0000 | ORAL_TABLET | Freq: Once | ORAL | Status: AC
  Administered 2019-08-18: 1 via ORAL
  Filled 2019-08-18: qty 1

## 2019-08-18 MED ORDER — COLCHICINE 0.6 MG PO TABS: 0.6000 mg | ORAL_TABLET | Freq: Every day | ORAL | 0 refills | Status: AC

## 2019-08-18 NOTE — ED Triage Notes (Signed)
Pt endorses widespread joint pain with chills. Afebrile. Has hx of gout. Tested for covid yesterday. VSS. Pt is bedbound at home.

## 2019-08-18 NOTE — ED Notes (Signed)
Pharmacy notified to send patient's [place] tab.

## 2019-08-18 NOTE — Discharge Instructions (Addendum)
Take the medications to help with your symptoms. Return to the ED if you start to experience worsening pain, injuries or falls, numbness in arms or legs or increased swelling.

## 2019-08-18 NOTE — ED Notes (Signed)
Called pt for vital recheck no response x3.

## 2019-08-18 NOTE — ED Provider Notes (Signed)
[facility] EMERGENCY DEPARTMENT Provider Note   CSN: [id number] Arrival date & time: 08/18/19  1246     History   Chief Complaint Chief Complaint  Patient presents with  . Joint Pain  . Chills    HPI Kurt Cross is a 68 y.o. male with a past medical history of hypertension, CHF, CKD, diabetes presenting to the ED with a chief complaint of joint pain.  Since yesterday, started having bilateral elbow, hand and ankle pain.  States that this feels similar to his prior gout flareups.  He does not take a daily gout medication.  He has not had a flareup in about 2 months and is unsure what caused this flareup recently.  He denies any fevers, injuries or trauma.  He states he usually does get gout in multiple joints. He is unsure what his gout has been treated with in the past.     HPI  Past Medical History:  Diagnosis Date  . AAA (abdominal aortic aneurysm)/ 4.5 cm ascending per CT angio 08/01/13 08/12/2013  . Abscess 02/13/2018  . Acute on chronic renal failure (HCC) 01/27/2016   02/15/16 Na 130, K 4.6, Bun 47, creat 2.07    . Acute on chronic respiratory failure ([place]) 01/27/2016  . Acute on chronic respiratory failure with hypoxia ([place]) 01/27/2016   02/14/16 CXR no acute pulmonary abnormality, a nodule density noted at the right upper lobe, could represent a granuloma versus neoplastic lesion, may CT   . Acute renal failure (ARF) ([place]) 08/15/2013  . AKI (acute kidney injury) ([place]) 11/12/2013  . Atrial fibrillation ([place])   . Atrial flutter ([place]) 02/22/2016  . Benign hypertension   . Bradycardia   . BRONCHITIS, CHRONIC 09/01/2007   Qualifier: Diagnosis of  By: Doy Mince LPN, Megan    . Cellulitis of left lower extremity 02/14/2018  . CHF (congestive heart failure), NYHA class II ([place])   . Chronic anticoagulation 02/22/2016  . Chronic combined systolic and diastolic CHF (congestive heart failure) ([place]) 08/04/2013   02/14/16 CXR no acute pulmonary abnormality, a nodule  density noted at the right upper lobe, could represent a granuloma versus neoplastic lesion, may CT   . Chronic respiratory failure ([place]) 02/10/2016  . Chronic ulcer of left heel ([place]) 02/17/2018  . CKD (chronic kidney disease), stage III 02/17/2018  . Complete heart block (HCC)    s/p PPM Bi-Ven  . Constipation, chronic 08/03/2013  . Debility 02/10/2016  . Decubitus ulcer of sacral region, stage 3 ([place]) 02/14/2018  . Diabetes mellitus, type 2 ([place])   . H/O medication noncompliance 02/22/2016  . Hernia of abdominal cavity   . Hyperkalemia 11/12/2013  . Hyperlipidemia   . Hypertension   . Hypokalemia   . Hypoxemia   . Hypoxia 01/26/2016  . 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 Kidney failure   . Neuropathy   . Obesity, Class III, BMI 40-49.9 (morbid obesity) ([place]) 09/01/2007   Qualifier: Diagnosis of  By: Doy Mince LPN, Megan    . Obstructive sleep apnea 09/01/2007   Qualifier: Diagnosis of  By: Doy Mince LPN, Megan    . OSA (obstructive sleep apnea)   . Recurrent ventral incisional hernia 08/14/2013  . Right leg pain 08/11/2013  . S/P placement of cardiac pacemaker 02/22/2016  . Sepsis ([place]) 08/02/2013  . Short gut syndrome due to distal jejunal SB fistula 10/22/2013    Patient Active  Problem List   Diagnosis Date Noted  . Elevated INR   . Dehydration   . Melena 07/10/2019  . Coumadin toxicity 07/09/2019  . Hypokalemia 07/09/2019  . Type 2 diabetes mellitus with stage 3b chronic kidney disease, with long-term current use of insulin ([place]) 07/08/2019  . Encounter for therapeutic drug monitoring 03/17/2019  . Gouty arthritis 03/12/2019  . On anticoagulant therapy 02/07/2019  . Dependence on wheelchair 02/07/2019  . Pneumatosis of gastric wall 02/05/2019  . Nausea & vomiting 02/05/2019  . Acute exacerbation of CHF (congestive heart failure) ([place]) 02/03/2019  . Diabetic ulcer of  ankle associated with type 2 diabetes mellitus, limited to breakdown of skin ([place]) 12/03/2018  . Chronic kidney disease (CKD), stage IV (severe) ([place]) 02/17/2018  . S/P placement of cardiac pacemaker 02/22/2016  . Atrial flutter ([place]) 02/22/2016  . Chronic anticoagulation 02/22/2016  . H/O medication noncompliance 02/22/2016  . Debility 02/10/2016  . Constipation 02/07/2016  . Acute on chronic combined systolic and diastolic CHF, NYHA class 2 ([place]) 01/27/2016  . Acute on chronic renal failure ([place]) 01/27/2016  . Acute on chronic respiratory failure with hypoxia ([place]) 01/27/2016  . Hypoxia 01/26/2016  . Atrial fibrillation/flutter 10/06/2013  . Recurrent ventral incisional hernia 08/14/2013  . Thoracic aortic aneurysm ([place]) 08/12/2013  . Right leg pain 08/11/2013  . Chronic combined systolic and diastolic CHF (congestive heart failure) ([place]) 08/04/2013  . Essential hypertension 06/28/2012  . Type 2 diabetes mellitus with stage 4 chronic kidney disease, with long-term current use of insulin ([place]) 09/01/2007  . Hyperlipidemia 09/01/2007  . Obesity, Class III, BMI 40-49.9 (morbid obesity) ([place]) 09/01/2007  . Obstructive sleep apnea 09/01/2007    Past Surgical History:  Procedure Laterality Date  . APPENDECTOMY    . biventricular pacemaker generator change  04/29/2014   MDT Viva CRT-P BiV pacemaker implanted by Dr Rolla Etienne in [place]  . biventricular pacemaker implantation  06/18/2007   MDT BIV pacemaker implanted by Dr Lovena Le  . BOWEL RESECTION  1980s?   ?colectomy/ostomy - Dr Elesa Hacker  . INCISION AND DRAINAGE ABSCESS N/A 08/14/2013   Procedure: INCISION AND DRAINAGE ABSCESS, mesh ;  Surgeon: [name], MD;  Location: [facility] ORS;  Service: General;  Laterality: N/A;  . [place]?   Dr. Lindon Romp - w mesh  . KNEE SURGERY Right   . LEFT HEART CATHETERIZATION WITH CORONARY ANGIOGRAM N/A 03/17/2014   Procedure: LEFT HEART CATHETERIZATION WITH CORONARY ANGIOGRAM;   Surgeon: [name], MD;  Location: [facility] CATH LAB;  Service: Cardiovascular;  Laterality: N/A;  . NASAL SEPTUM SURGERY    . OSTOMY TAKE DOWN  1980s?   Dr Elesa Hacker  . TONSILLECTOMY AND ADENOIDECTOMY          Home Medications    Prior to Admission medications   Medication Sig Start Date End Date Taking? Authorizing Provider  Accu-Chek Softclix Lancets lancets Use to test blood sugars 1-2 times daily. 05/17/19   [name], MD  Blood Glucose Monitoring Suppl (ACCU-CHEK AVIVA) device Use to test blood sugars 1-2 times daily. 05/17/19 05/16/20  [name], MD  colchicine 0.6 MG tablet Take 1 tablet (0.6 mg total) by mouth daily. 08/18/19   Concetta Guion, PA-C  gabapentin (NEURONTIN) 300 MG capsule Take 1 capsule (300 mg total) by mouth 3 (three) times daily. 06/22/19   [name], MD  glucose blood (ACCU-CHEK AVIVA PLUS) test strip Use to test blood sugars 1-2 times daily. 05/17/19  [name], MD  insulin NPH Human (NOVOLIN N) 100 UNIT/ML injection Inject 5-20 Units into the skin 2 (two) times daily before a meal.     [provider]  insulin regular (NOVOLIN R) 100 units/mL injection Use with sliding scale. Patient taking differently: Inject 5-20 Units into the skin 2 (two) times daily before a meal.  02/12/19   Nita Sells, MD  isosorbide-hydrALAZINE (BIDIL) 20-37.5 MG tablet Take 0.5 tablets by mouth 3 (three) times daily. 08/11/19   Larey Dresser, MD  potassium chloride SA (KLOR-CON) 20 MEQ tablet Take 2 tablets (40 mEq total) by mouth daily. Please cancel all previous orders for current medication. Change in dosage or pill size. 08/12/19   Larey Dresser, MD  rosuvastatin (CRESTOR) 10 MG tablet Take 1 tablet (10 mg total) by mouth daily. 05/06/19   [name], MD  torsemide (DEMADEX) 20 MG tablet Take 2 tablets (40 mg total) by mouth daily. 08/11/19   Larey Dresser, MD  traZODone (DESYREL) 50 MG tablet Take 2  tablets (100 mg total) by mouth at bedtime as needed for sleep. 05/06/19   [name], MD    Family History Family History  Problem Relation Age of Onset  . Diabetes Mother   . Alcohol abuse Mother   . Alcoholism Father   . Heart Problems Maternal Grandmother     Social History Social History   Tobacco Use  . Smoking status: Former Smoker    Packs/day: 1.00    Years: 40.00    Pack years: 40.00    Types: Cigarettes    Quit date: 08/21/2012    Years since quitting: 6.9  . Smokeless tobacco: Never Used  Substance Use Topics  . Alcohol use: Not Currently    Alcohol/week: 0.0 standard drinks    Comment: occasional  . Drug use: No     Allergies   Patient has no known allergies.   Review of Systems Review of Systems  Constitutional: Negative for appetite change, chills and fever.  HENT: Negative for ear pain, rhinorrhea, sneezing and sore throat.   Eyes: Negative for photophobia and visual disturbance.  Respiratory: Negative for cough, chest tightness, shortness of breath and wheezing.   Cardiovascular: Negative for chest pain and palpitations.  Gastrointestinal: Negative for abdominal pain, blood in stool, constipation, diarrhea, nausea and vomiting.  Genitourinary: Negative for dysuria, hematuria and urgency.  Musculoskeletal: Positive for arthralgias and joint swelling. Negative for myalgias.  Skin: Negative for rash.  Neurological: Negative for dizziness, weakness and light-headedness.     Physical Exam Updated Vital Signs BP 136/68 (BP Location: Right Arm)   Pulse 95   Temp 98.1 F (36.7 C) (Oral)   Resp 18   SpO2 94%   Physical Exam Vitals signs and nursing note reviewed.  Constitutional:      General: He is not in acute distress.    Appearance: He is well-developed.  HENT:     Head: Normocephalic and atraumatic.     Nose: Nose normal.  Eyes:     General: No scleral icterus.       Left eye: No discharge.     Conjunctiva/sclera:  Conjunctivae normal.  Neck:     Musculoskeletal: Normal range of motion and neck supple.  Cardiovascular:     Rate and Rhythm: Normal rate and regular rhythm.     Heart sounds: Normal heart sounds. No murmur. No friction rub. No gallop.   Pulmonary:     Effort: Pulmonary  effort is normal. No respiratory distress.     Breath sounds: Normal breath sounds.  Abdominal:     General: Bowel sounds are normal. There is no distension.     Palpations: Abdomen is soft.     Tenderness: There is no abdominal tenderness. There is no guarding.  Musculoskeletal: Normal range of motion.     Comments: Diffuse TTP and edema of bilateral elbows, hands and ankle.  Able to perform range of motion although pain secondary to movement.  2+ DP pulse 2+ radial pulse palpated bilaterally.  No warmth or erythema of joints noted.  Sensation intact to light touch.  Skin:    General: Skin is warm and dry.     Findings: No rash.  Neurological:     Mental Status: He is alert.     Motor: No abnormal muscle tone.     Coordination: Coordination normal.      ED Treatments / Results  Labs (all labs ordered are listed, but only abnormal results are displayed) Labs Reviewed - No data to display  EKG None  Radiology No results found.  Procedures Procedures (including critical care time)  Medications Ordered in ED Medications  HYDROcodone-acetaminophen (NORCO/VICODIN) 5-325 MG per tablet 1 tablet (has no administration in time range)  colchicine tablet 0.3 mg (has no administration in time range)     Initial Impression / Assessment and Plan / ED Course  I have reviewed the triage vital signs and the nursing notes.  Pertinent labs & imaging results that were available during my care of the patient were reviewed by me and considered in my medical decision making (see chart for details).        68 year old male with past medical history of gout presenting to the ED for gout flareup.  Symptoms began  yesterday.  Reports pain in bilateral elbows, hands and ankles.  States that this is typical of his gout flareups.  No known triggers.  Patient has been treated with colchicine in the past.  No evidence of septic joints.  Doubt vascular cause of symptoms.  Will give prescription for colchicine, follow-up with PCP.  Patient is hemodynamically stable, in NAD, and able to ambulate in the ED. Evaluation does not show pathology that would require ongoing emergent intervention or inpatient treatment. I explained the diagnosis to the patient. Pain has been managed and has no complaints prior to discharge. Patient is comfortable with above plan and is stable for discharge at this time. All questions were answered prior to disposition. Strict return precautions for returning to the ED were discussed. Encouraged follow up with PCP.   An After Visit Summary was printed and given to the patient.   Portions of this note were generated with [employer]. Dictation errors may occur despite best attempts at proofreading.  Final Clinical Impressions(s) / ED Diagnoses   Final diagnoses:  Acute gout of multiple sites, unspecified cause    ED Discharge Orders         Ordered    colchicine 0.6 MG tablet  Daily     08/18/19 2138           Delia Heady, PA-C 08/18/19 2140    Charlesetta Shanks, MD 08/20/19 1312

## 2019-08-18 NOTE — ED Notes (Signed)
Called pt x2 for vitals, no response. °

## 2019-08-18 NOTE — ED Notes (Signed)
Patient verbalized understanding of his discharge instructions / prescription and follow up care , waiting for [facility] for transport.

## 2019-08-18 NOTE — ED Notes (Signed)
[facility] notified by Network engineer for transport.

## 2019-08-19 ENCOUNTER — Encounter (HOSPITAL_COMMUNITY)
Admission: RE | Disposition: A | Discharge status: Home / Self Care | Payer: Self-pay | Source: Home / Self Care | Attending: Internal Medicine

## 2019-08-19 ENCOUNTER — Other Ambulatory Visit: Payer: Self-pay

## 2019-08-19 ENCOUNTER — Ambulatory Visit (HOSPITAL_COMMUNITY)
Admission: RE | Admit: 2019-08-19 | Discharge: 2019-08-20 | Disposition: A | Discharge status: Home / Self Care | Payer: Medicare HMO | Attending: Internal Medicine | Admitting: Internal Medicine

## 2019-08-19 DIAGNOSIS — Z959 Presence of cardiac and vascular implant and graft, unspecified: Secondary | ICD-10-CM

## 2019-08-19 DIAGNOSIS — Z79899 Other long term (current) drug therapy: Secondary | ICD-10-CM | POA: Insufficient documentation

## 2019-08-19 DIAGNOSIS — I714 Abdominal aortic aneurysm, without rupture: Secondary | ICD-10-CM | POA: Insufficient documentation

## 2019-08-19 DIAGNOSIS — I4819 Other persistent atrial fibrillation: Secondary | ICD-10-CM | POA: Insufficient documentation

## 2019-08-19 DIAGNOSIS — E1122 Type 2 diabetes mellitus with diabetic chronic kidney disease: Secondary | ICD-10-CM | POA: Insufficient documentation

## 2019-08-19 DIAGNOSIS — E785 Hyperlipidemia, unspecified: Secondary | ICD-10-CM | POA: Insufficient documentation

## 2019-08-19 DIAGNOSIS — M2550 Pain in unspecified joint: Secondary | ICD-10-CM | POA: Diagnosis not present

## 2019-08-19 DIAGNOSIS — I428 Other cardiomyopathies: Secondary | ICD-10-CM | POA: Insufficient documentation

## 2019-08-19 DIAGNOSIS — I130 Hypertensive heart and chronic kidney disease with heart failure and stage 1 through stage 4 chronic kidney disease, or unspecified chronic kidney disease: Secondary | ICD-10-CM | POA: Insufficient documentation

## 2019-08-19 DIAGNOSIS — K439 Ventral hernia without obstruction or gangrene: Secondary | ICD-10-CM | POA: Insufficient documentation

## 2019-08-19 DIAGNOSIS — N183 Chronic kidney disease, stage 3 unspecified: Secondary | ICD-10-CM | POA: Insufficient documentation

## 2019-08-19 DIAGNOSIS — I5022 Chronic systolic (congestive) heart failure: Secondary | ICD-10-CM | POA: Diagnosis not present

## 2019-08-19 DIAGNOSIS — I447 Left bundle-branch block, unspecified: Secondary | ICD-10-CM | POA: Diagnosis not present

## 2019-08-19 DIAGNOSIS — Z7901 Long term (current) use of anticoagulants: Secondary | ICD-10-CM | POA: Insufficient documentation

## 2019-08-19 DIAGNOSIS — E876 Hypokalemia: Secondary | ICD-10-CM | POA: Insufficient documentation

## 2019-08-19 DIAGNOSIS — G4733 Obstructive sleep apnea (adult) (pediatric): Secondary | ICD-10-CM | POA: Insufficient documentation

## 2019-08-19 DIAGNOSIS — M109 Gout, unspecified: Secondary | ICD-10-CM | POA: Insufficient documentation

## 2019-08-19 DIAGNOSIS — Z794 Long term (current) use of insulin: Secondary | ICD-10-CM | POA: Insufficient documentation

## 2019-08-19 DIAGNOSIS — R52 Pain, unspecified: Secondary | ICD-10-CM | POA: Diagnosis not present

## 2019-08-19 DIAGNOSIS — Z45010 Encounter for checking and testing of cardiac pacemaker pulse generator [battery]: Secondary | ICD-10-CM | POA: Diagnosis not present

## 2019-08-19 DIAGNOSIS — Z7401 Bed confinement status: Secondary | ICD-10-CM | POA: Diagnosis not present

## 2019-08-19 DIAGNOSIS — Z87891 Personal history of nicotine dependence: Secondary | ICD-10-CM | POA: Insufficient documentation

## 2019-08-19 DIAGNOSIS — Z6841 Body Mass Index (BMI) 40.0 and over, adult: Secondary | ICD-10-CM | POA: Insufficient documentation

## 2019-08-19 DIAGNOSIS — R0902 Hypoxemia: Secondary | ICD-10-CM | POA: Diagnosis not present

## 2019-08-19 HISTORY — PX: PPM GENERATOR CHANGEOUT: EP1233

## 2019-08-19 HISTORY — PX: LEAD REVISION/REPAIR: EP1213

## 2019-08-19 LAB — GLUCOSE, CAPILLARY
Glucose-Capillary: 298 mg/dL — ABNORMAL HIGH (ref 70–99)
Glucose-Capillary: 239 mg/dL — ABNORMAL HIGH (ref 70–99)

## 2019-08-19 LAB — CBC
HCT: 41.2 % (ref 39.0–52.0)
Hemoglobin: 14.3 g/dL (ref 13.0–17.0)
MCH: 32.3 pg (ref 26.0–34.0)
MCHC: 34.7 g/dL (ref 30.0–36.0)
MCV: 93 fL (ref 80.0–100.0)
Platelets: 242 10*3/uL (ref 150–400)
RBC: 4.43 MIL/uL (ref 4.22–5.81)
RDW: 14.4 % (ref 11.5–15.5)
WBC: 12.2 10*3/uL — ABNORMAL HIGH (ref 4.0–10.5)
nRBC: 0 % (ref 0.0–0.2)

## 2019-08-19 LAB — SURGICAL PCR SCREEN
MRSA, PCR: POSITIVE — AB
Staphylococcus aureus: POSITIVE — AB

## 2019-08-19 LAB — PROTIME-INR
INR: 1.6 — ABNORMAL HIGH (ref 0.8–1.2)
Prothrombin Time: 19.1 seconds — ABNORMAL HIGH (ref 11.4–15.2)

## 2019-08-19 LAB — BASIC METABOLIC PANEL
Anion gap: 16 — ABNORMAL HIGH (ref 5–15)
BUN: 37 mg/dL — ABNORMAL HIGH (ref 8–23)
CO2: 31 mmol/L (ref 22–32)
Calcium: 8.3 mg/dL — ABNORMAL LOW (ref 8.9–10.3)
Chloride: 87 mmol/L — ABNORMAL LOW (ref 98–111)
Creatinine, Ser: 2.17 mg/dL — ABNORMAL HIGH (ref 0.61–1.24)
GFR calc Af Amer: 35 mL/min — ABNORMAL LOW (ref >60)
GFR calc non Af Amer: 31 mL/min — ABNORMAL LOW (ref >60)
Glucose, Bld: 276 mg/dL — ABNORMAL HIGH (ref 70–99)
Potassium: 2.9 mmol/L — ABNORMAL LOW (ref 3.5–5.1)
Sodium: 134 mmol/L — ABNORMAL LOW (ref 135–145)

## 2019-08-19 SURGERY — PPM GENERATOR CHANGEOUT

## 2019-08-19 MED ORDER — TORSEMIDE 20 MG PO TABS: 40.0000 mg | ORAL_TABLET | Freq: Every day | ORAL | Status: DC

## 2019-08-19 MED ORDER — SODIUM CHLORIDE 0.9 % IV SOLN
INTRAVENOUS | Status: DC
  Administered 2019-08-19: 13:00:00 via INTRAVENOUS

## 2019-08-19 MED ORDER — HEPARIN (PORCINE) IN NACL 1000-0.9 UT/500ML-% IV SOLN
INTRAVENOUS | Status: AC
  Filled 2019-08-19: qty 500

## 2019-08-19 MED ORDER — MUPIROCIN 2 % EX OINT
1.0000 "application " | TOPICAL_OINTMENT | Freq: Once | CUTANEOUS | Status: AC
  Administered 2019-08-19: 13:00:00 1 via TOPICAL
  Filled 2019-08-19: qty 22

## 2019-08-19 MED ORDER — POTASSIUM CHLORIDE CRYS ER 20 MEQ PO TBCR
40.0000 meq | EXTENDED_RELEASE_TABLET | Freq: Once | ORAL | Status: AC
  Administered 2019-08-19: 14:00:00 40 meq via ORAL
  Filled 2019-08-19 (×2): qty 2

## 2019-08-19 MED ORDER — COLCHICINE 0.6 MG PO TABS
0.6000 mg | ORAL_TABLET | Freq: Once | ORAL | Status: DC
  Filled 2019-08-19: qty 1

## 2019-08-19 MED ORDER — TRAZODONE HCL 100 MG PO TABS: 100.0000 mg | ORAL_TABLET | Freq: Every evening | ORAL | Status: DC | PRN

## 2019-08-19 MED ORDER — SODIUM CHLORIDE 0.9 % IV SOLN
INTRAVENOUS | Status: AC
  Filled 2019-08-19: qty 2

## 2019-08-19 MED ORDER — GABAPENTIN 300 MG PO CAPS
300.0000 mg | ORAL_CAPSULE | Freq: Three times a day (TID) | ORAL | Status: DC
  Administered 2019-08-19 – 2019-08-20 (×3): 300 mg via ORAL
  Filled 2019-08-19 (×3): qty 1

## 2019-08-19 MED ORDER — SODIUM CHLORIDE 0.9% FLUSH: 3.0000 mL | INTRAVENOUS | Status: DC | PRN

## 2019-08-19 MED ORDER — LIDOCAINE HCL (PF) 1 % IJ SOLN
INTRAMUSCULAR | Status: AC
  Filled 2019-08-19: qty 30

## 2019-08-19 MED ORDER — SODIUM CHLORIDE 0.9% FLUSH
3.0000 mL | Freq: Two times a day (BID) | INTRAVENOUS | Status: DC
  Administered 2019-08-20: 3 mL via INTRAVENOUS

## 2019-08-19 MED ORDER — SODIUM CHLORIDE 0.9 % IV SOLN
80.0000 mg | INTRAVENOUS | Status: AC
  Administered 2019-08-19: 80 mg
  Filled 2019-08-19: qty 2

## 2019-08-19 MED ORDER — ACETAMINOPHEN 325 MG PO TABS: 325.0000 mg | ORAL_TABLET | ORAL | Status: DC | PRN

## 2019-08-19 MED ORDER — SODIUM CHLORIDE 0.9 % IV SOLN
INTRAVENOUS | Status: DC | PRN
  Administered 2019-08-19: 500 mL

## 2019-08-19 MED ORDER — MIDAZOLAM HCL 5 MG/5ML IJ SOLN
INTRAMUSCULAR | Status: AC
  Filled 2019-08-19: qty 5

## 2019-08-19 MED ORDER — FENTANYL CITRATE (PF) 100 MCG/2ML IJ SOLN
INTRAMUSCULAR | Status: DC | PRN
  Administered 2019-08-19 (×7): 12.5 ug via INTRAVENOUS

## 2019-08-19 MED ORDER — CEFAZOLIN SODIUM-DEXTROSE 1-4 GM/50ML-% IV SOLN
1.0000 g | Freq: Four times a day (QID) | INTRAVENOUS | Status: AC
  Administered 2019-08-19 – 2019-08-20 (×3): 1 g via INTRAVENOUS
  Filled 2019-08-19 (×3): qty 50

## 2019-08-19 MED ORDER — DEXTROSE 5 % IV SOLN
3.0000 g | INTRAVENOUS | Status: AC
  Administered 2019-08-19: 15:00:00 3 g via INTRAVENOUS
  Filled 2019-08-19 (×2): qty 3000

## 2019-08-19 MED ORDER — COLCHICINE 0.6 MG PO TABS: 0.6000 mg | ORAL_TABLET | Freq: Every day | ORAL | Status: DC

## 2019-08-19 MED ORDER — INSULIN ASPART 100 UNIT/ML ~~LOC~~ SOLN
0.0000 [IU] | Freq: Three times a day (TID) | SUBCUTANEOUS | Status: DC
  Administered 2019-08-20: 10:00:00 3 [IU] via SUBCUTANEOUS
  Administered 2019-08-20: 5 [IU] via SUBCUTANEOUS
  Administered 2019-08-20: 8 [IU] via SUBCUTANEOUS

## 2019-08-19 MED ORDER — ONDANSETRON HCL 4 MG/2ML IJ SOLN: 4.0000 mg | Freq: Four times a day (QID) | INTRAMUSCULAR | Status: DC | PRN

## 2019-08-19 MED ORDER — MIDAZOLAM HCL 5 MG/5ML IJ SOLN
INTRAMUSCULAR | Status: DC | PRN
  Administered 2019-08-19 (×7): 1 mg via INTRAVENOUS

## 2019-08-19 MED ORDER — MUPIROCIN 2 % EX OINT
1.0000 "application " | TOPICAL_OINTMENT | Freq: Two times a day (BID) | CUTANEOUS | Status: DC
  Administered 2019-08-19 – 2019-08-20 (×2): 1 via NASAL
  Filled 2019-08-19 (×2): qty 22

## 2019-08-19 MED ORDER — SODIUM CHLORIDE 0.9 % IV SOLN: INTRAVENOUS | Status: DC | PRN

## 2019-08-19 MED ORDER — IOHEXOL 350 MG/ML SOLN
INTRAVENOUS | Status: DC | PRN
  Administered 2019-08-19 (×2): 15 mL

## 2019-08-19 MED ORDER — POTASSIUM CHLORIDE CRYS ER 20 MEQ PO TBCR
40.0000 meq | EXTENDED_RELEASE_TABLET | Freq: Every day | ORAL | Status: DC
  Administered 2019-08-19: 20:00:00 40 meq via ORAL
  Filled 2019-08-19: qty 2

## 2019-08-19 MED ORDER — CHLORHEXIDINE GLUCONATE 4 % EX LIQD
4.0000 "application " | Freq: Once | CUTANEOUS | Status: DC
  Filled 2019-08-19: qty 60

## 2019-08-19 MED ORDER — POTASSIUM CHLORIDE 10 MEQ/100ML IV SOLN
INTRAVENOUS | Status: AC
  Filled 2019-08-19: qty 100

## 2019-08-19 MED ORDER — LIDOCAINE HCL (PF) 1 % IJ SOLN
INTRAMUSCULAR | Status: DC | PRN
  Administered 2019-08-19: 40 mL

## 2019-08-19 MED ORDER — ISOSORB DINITRATE-HYDRALAZINE 20-37.5 MG PO TABS
0.5000 | ORAL_TABLET | Freq: Three times a day (TID) | ORAL | Status: DC
  Administered 2019-08-19 – 2019-08-20 (×3): 0.5 via ORAL
  Filled 2019-08-19 (×3): qty 1

## 2019-08-19 MED ORDER — CHLORHEXIDINE GLUCONATE CLOTH 2 % EX PADS
6.0000 | MEDICATED_PAD | Freq: Every day | CUTANEOUS | Status: DC
  Administered 2019-08-20: 05:00:00 6 via TOPICAL

## 2019-08-19 MED ORDER — HEPARIN (PORCINE) IN NACL 1000-0.9 UT/500ML-% IV SOLN
INTRAVENOUS | Status: DC | PRN
  Administered 2019-08-19 (×2): 500 mL

## 2019-08-19 MED ORDER — POTASSIUM CHLORIDE 10 MEQ/100ML IV SOLN
10.0000 meq | INTRAVENOUS | Status: AC
  Administered 2019-08-19 (×4): 10 meq via INTRAVENOUS
  Filled 2019-08-19 (×3): qty 100

## 2019-08-19 MED ORDER — FENTANYL CITRATE (PF) 100 MCG/2ML IJ SOLN
INTRAMUSCULAR | Status: AC
  Filled 2019-08-19: qty 2

## 2019-08-19 MED ORDER — INSULIN NPH (HUMAN) (ISOPHANE) 100 UNIT/ML ~~LOC~~ SUSP: 5.0000 [IU] | Freq: Two times a day (BID) | SUBCUTANEOUS | Status: DC

## 2019-08-19 MED ORDER — SODIUM CHLORIDE 0.9 % IV SOLN: 250.0000 mL | INTRAVENOUS | Status: DC | PRN

## 2019-08-19 MED ORDER — MUPIROCIN 2 % EX OINT
TOPICAL_OINTMENT | CUTANEOUS | Status: AC
  Administered 2019-08-19: 1 via TOPICAL
  Filled 2019-08-19: qty 22

## 2019-08-19 MED ORDER — INSULIN NPH (HUMAN) (ISOPHANE) 100 UNIT/ML ~~LOC~~ SUSP
8.0000 [IU] | Freq: Two times a day (BID) | SUBCUTANEOUS | Status: DC
  Administered 2019-08-19 – 2019-08-20 (×2): 8 [IU] via SUBCUTANEOUS
  Filled 2019-08-19: qty 10

## 2019-08-19 MED ORDER — HYDROCODONE-ACETAMINOPHEN 5-325 MG PO TABS
1.0000 | ORAL_TABLET | ORAL | Status: DC | PRN
  Administered 2019-08-19 – 2019-08-20 (×3): 2 via ORAL
  Filled 2019-08-19 (×3): qty 2

## 2019-08-19 SURGICAL SUPPLY — 29 items
ADAPTER SEALING SSA-EW-09 (MISCELLANEOUS) ×4 IMPLANT
ADPR INTRO LNG 9FR SL XTD WNG (MISCELLANEOUS) ×2
BALLN ATTAIN 80 (BALLOONS) ×2
BALLN ATTAIN 80CM 6215 (BALLOONS) ×1
BALLOON ATTAIN 80 (BALLOONS) IMPLANT
CABLE SURGICAL S-101-97-12 (CABLE) ×3 IMPLANT
CATH ATTAIN COM SURV 6250V-MB2 (CATHETERS) ×4 IMPLANT
CATH ATTAIN SEL SURV 6248V-90 (CATHETERS) ×2 IMPLANT
CATH HEX JOSEPH 2-5-2 65CM 6F (CATHETERS) ×2 IMPLANT
CATH RIGHTSITE C315HIS02 (CATHETERS) ×4 IMPLANT
DEVICE DISSECT PLASMABLAD 3.0S (MISCELLANEOUS) IMPLANT
GUIDEWIRE ANGLED .035X150CM (WIRE) ×2 IMPLANT
HOVERMATT SINGLE USE (MISCELLANEOUS) ×2 IMPLANT
KIT ESSENTIALS PG (KITS) ×2 IMPLANT
KIT MICROPUNCTURE NIT STIFF (SHEATH) ×2 IMPLANT
PACEMAKER PRCT MRI CRTP W1TR01 (Pacemaker) IMPLANT
PAD PRO RADIOLUCENT 2001M-C (PAD) ×3 IMPLANT
PLASMABLADE 3.0S (MISCELLANEOUS) ×3
POUCH AIGIS-R ANTIBACT PPM (Mesh General) ×3 IMPLANT
POUCH AIGIS-R ANTIBACT PPM MED (Mesh General) IMPLANT
PPM PRECEPTA MRI CRT-P W1TR01 (Pacemaker) ×3 IMPLANT
SHEATH 7FR PRELUDE SNAP 13 (SHEATH) ×2 IMPLANT
SHEATH 9.5FR PRELUDE SNAP 13 (SHEATH) ×2 IMPLANT
SHEATH PINNACLE 6F 10CM (SHEATH) ×2 IMPLANT
SLITTER 6232ADJ (MISCELLANEOUS) ×2 IMPLANT
TRAY PACEMAKER INSERTION (PACKS) ×3 IMPLANT
WIRE ACUITY WHISPER EDS 4648 (WIRE) ×2 IMPLANT
WIRE HI TORQ VERSACORE-J 145CM (WIRE) ×2 IMPLANT
WIRE MAILMAN 182CM (WIRE) ×2 IMPLANT

## 2019-08-19 NOTE — Progress Notes (Addendum)
Pharmacy was called for po and IV potassium. 1415, called for pt/inr, still on machine per lab tech,

## 2019-08-19 NOTE — Progress Notes (Signed)
Complained of generalized pain on admission. Given vicodin 2 tablets po as soon as available from pyxis. Called pharmacist for verification of medication to release from pyxis.

## 2019-08-19 NOTE — H&P (Addendum)
Cardiology Admission History and Physical:   Patient ID: Kurt Cross MRN: [id number]; DOB: [birth year]   Admission date: 08/19/2019  Primary Care Provider: [name], MD Primary Cardiologist: Ena Dawley, MD  AHF: Dr. Aundra Dubin Primary Electrophysiologist:  Dr. Rayann Heman  Chief Complaint:  PPM generator battery depletion, LV lead w/high thresholds  Patient Profile:   Kurt Cross is a 68 y.o. male with persistent AFib, NICM, CHB, CKD (III), DM, gout, chronic CHF (systolic), obesity, ventral hernia s/p numerous surgeries and nonambulatory apparently 2/2 this, AAA, HTN, HLD, hypokalemia  History of Present Illness:   [name] has a MDT CRT-P that has reached ERI, his LV lead is known to have high thresholds, and planned for generator change and possible lead revision today.  Pre-procedure labs note chronic hypokalemia, the AHF team has addressed his lab from 08/16/2019.   However he reports being out of his K+ at home 5-6 days now.   08/17/2019 COVID negative  The patient has been suffering with an acute diffuse gout flare, started on colchicine yesterday via the ER, though has not yet started this.  His joints are swollen and painful, warm.  He is afebrile here and does not have symptoms of illness.  No CP, SOB  He has held his coumadin 2 days   Heart Pathway Score:     Past Medical History:  Diagnosis Date   AAA (abdominal aortic aneurysm)/ 4.5 cm ascending per CT angio 08/01/13 08/12/2013   Abscess 02/13/2018   Acute on chronic renal failure ([place]) 01/27/2016   02/15/16 Na 130, K 4.6, Bun 47, creat 2.07     Acute on chronic respiratory failure ([place]) 01/27/2016   Acute on chronic respiratory failure with hypoxia ([place]) 01/27/2016   02/14/16 CXR no acute pulmonary abnormality, a nodule density noted at the right upper lobe, could represent a granuloma versus neoplastic lesion, may CT    Acute renal failure (ARF) ([place]) 08/15/2013   AKI (acute kidney injury) ([place])  11/12/2013   Atrial fibrillation ([place])    Atrial flutter ([place]) 02/22/2016   Benign hypertension    Bradycardia    BRONCHITIS, CHRONIC 09/01/2007   Qualifier: Diagnosis of  By: Doy Mince LPN, Megan     Cellulitis of left lower extremity 02/14/2018   CHF (congestive heart failure), NYHA class II (HCC)    Chronic anticoagulation 02/22/2016   Chronic combined systolic and diastolic CHF (congestive heart failure) ([place]) 08/04/2013   02/14/16 CXR no acute pulmonary abnormality, a nodule density noted at the right upper lobe, could represent a granuloma versus neoplastic lesion, may CT    Chronic respiratory failure ([place]) 02/10/2016   Chronic ulcer of left heel ([place]) 02/17/2018   CKD (chronic kidney disease), stage III 02/17/2018   Complete heart block ([place])    s/p PPM Bi-Ven   Constipation, chronic 08/03/2013   Debility 02/10/2016   Decubitus ulcer of sacral region, stage 3 ([place]) 02/14/2018   Diabetes mellitus, type 2 ([place])    H/O medication noncompliance 02/22/2016   Hernia of abdominal cavity    Hyperkalemia 11/12/2013   Hyperlipidemia    Hypertension    Hypokalemia    Hypoxemia    Hypoxia 01/26/2016   Infected prosthetic mesh of abdominal wall with GIANT abscess s/p removal 08/14/2013 08/14/2013   Jejunal SB fistula into abdominal wound 08/23/2013   Kidney failure    Neuropathy    Obesity, Class III, BMI 40-49.9 (morbid obesity) ([place]) 09/01/2007   Qualifier: Diagnosis of  By: Doy Mince LPN, [place]  Obstructive sleep apnea 09/01/2007   Qualifier: Diagnosis of  By: Doy Mince LPN, Megan     OSA (obstructive sleep apnea)    Recurrent ventral incisional hernia 08/14/2013   Right leg pain 08/11/2013   S/P placement of cardiac pacemaker 02/22/2016   Sepsis ([place]) 08/02/2013   Short gut syndrome due to distal jejunal SB fistula 10/22/2013    Past Surgical History:  Procedure Laterality Date   APPENDECTOMY     biventricular pacemaker generator change  04/29/2014    MDT Viva CRT-P BiV pacemaker implanted by Dr Rolla Etienne in [place]   biventricular pacemaker implantation  06/18/2007   MDT BIV pacemaker implanted by Dr Lovena Le   BOWEL RESECTION  1980s?   ?colectomy/ostomy - Dr Elesa Hacker   INCISION AND DRAINAGE ABSCESS N/A 08/14/2013   Procedure: INCISION AND DRAINAGE ABSCESS, mesh ;  Surgeon: [name], MD;  Location: [facility] ORS;  Service: General;  Laterality: N/A;   [place]?   Dr. Lindon Romp - w mesh   KNEE SURGERY Right    LEFT HEART CATHETERIZATION WITH CORONARY ANGIOGRAM N/A 03/17/2014   Procedure: LEFT HEART CATHETERIZATION WITH CORONARY ANGIOGRAM;  Surgeon: [name], MD;  Location: [facility] C[facility] LAB;  Service: Cardiovascular;  Laterality: N/A;   NASAL SEPTUM SURGERY     OSTOMY TAKE DOWN  1980s?   Dr Elesa Hacker   TONSILLECTOMY AND ADENOIDECTOMY       Medications Prior to Admission: Prior to Admission medications   Medication Sig Start Date End Date Taking? Authorizing Provider  gabapentin (NEURONTIN) 300 MG capsule Take 1 capsule (300 mg total) by mouth 3 (three) times daily. 06/22/19  Yes [name], MD  insulin NPH Human (NOVOLIN N) 100 UNIT/ML injection Inject 5-20 Units into the skin 2 (two) times daily before a meal.    Yes [provider]  insulin regular (NOVOLIN R) 100 units/mL injection Use with sliding scale. Patient taking differently: Inject 5-20 Units into the skin 2 (two) times daily before a meal.  02/12/19  Yes Samtani, Jai-Gurmukh, MD  isosorbide-hydrALAZINE (BIDIL) 20-37.5 MG tablet Take 0.5 tablets by mouth 3 (three) times daily. 08/11/19  Yes Larey Dresser, MD  potassium chloride SA (KLOR-CON) 20 MEQ tablet Take 2 tablets (40 mEq total) by mouth daily. Please cancel all previous orders for current medication. Change in dosage or pill size. 08/12/19  Yes Larey Dresser, MD  rosuvastatin (CRESTOR) 10 MG tablet Take 1 tablet (10 mg total) by mouth daily. 05/06/19  Yes [name], MD  torsemide (DEMADEX) 20 MG tablet Take 2 tablets (40 mg total) by mouth daily. 08/11/19  Yes Larey Dresser, MD  traZODone (DESYREL) 50 MG tablet Take 2 tablets (100 mg total) by mouth at bedtime as needed for sleep. 05/06/19  Yes [name], MD  Accu-Chek Softclix Lancets lancets Use to test blood sugars 1-2 times daily. 05/17/19   [name], MD  Blood Glucose Monitoring Suppl (ACCU-CHEK AVIVA) device Use to test blood sugars 1-2 times daily. 05/17/19 05/16/20  [name], MD  colchicine 0.6 MG tablet Take 1 tablet (0.6 mg total) by mouth daily. 08/18/19   Khatri, Hina, PA-C  glucose blood (ACCU-CHEK AVIVA PLUS) test strip Use to test blood sugars 1-2 times daily. 05/17/19   [name], MD     Allergies:   No Known Allergies  Social History:   Social History   Socioeconomic History   Marital status: Married  Spouse name: Not on file   Number of children: Not on file   Years of education: Not on file   Highest education level: Not on file  Occupational History   Not on file  Social Needs   Financial resource strain: Not on file   Food insecurity    Worry: Never true    Inability: Never true   Transportation needs    Medical: No    Non-medical: No  Tobacco Use   Smoking status: Former Smoker    Packs/day: 1.00    Years: 40.00    Pack years: 40.00    Types: Cigarettes    Quit date: 08/21/2012    Years since quitting: 6.9   Smokeless tobacco: Never Used  Substance and Sexual Activity   Alcohol use: Not Currently    Alcohol/week: 0.0 standard drinks    Comment: occasional   Drug use: No   Sexual activity: Not on file  Lifestyle   Physical activity    Days per week: Not on file    Minutes per session: Not on file   Stress: Not on file  Relationships   Social connections    Talks on phone: Not on file    Gets together: Not on file    Attends religious service: Not on file    Active member of  club or organization: Not on file    Attends meetings of clubs or organizations: Not on file    Relationship status: Not on file   Intimate partner violence    Fear of current or ex partner: Not on file    Emotionally abused: Not on file    Physically abused: Not on file    Forced sexual activity: Not on file  Other Topics Concern   Not on file  Social History Narrative   Lives at [facility] since 02/06/16   Married -[name]   Former smoker -stopped 2013   No Advance Directives    Family History:   The patient's family history includes Alcohol abuse in his mother; Alcoholism in his father; Diabetes in his mother; Heart Problems in his maternal grandmother.    ROS:  Please see the history of present illness.  All other ROS reviewed and negative.     Physical Exam/Data:   Vitals:   08/19/19 1221  BP: (!) 105/37  Pulse: 73  Resp: 18  Temp: 98.9 F (37.2 C)  TempSrc: Skin  SpO2: 97%  Weight: 131.1 kg  Height: 5\' 11"  (1.803 m)   No intake or output data in the 24 hours ending 08/19/19 1235 Last 3 Weights 08/19/2019 07/10/2019 07/09/2019  Weight (lbs) 289 lb 278 lb 3.5 oz 280 lb  Weight (kg) 131.09 kg 126.2 kg 127.007 kg     Body mass index is 40.31 kg/m.  General:  Well nourished, well developed, in no acute distress HEENT: normal Lymph: no adenopathy Neck: no JVD Endocrine:  No thryomegaly Vascular: No carotid bruits  Cardiac:  RRR; no murmurs, gallops or rubs Lungs:  CTA b/l, no wheezing, rhonchi or rales  Abd: soft, nontender, obese Ext: no edema LE, chronic looking skin changes, few sporadic superficial scabbed wounds b/l LE Musculoskeletal:  No deformities, swollen warm hands, elbows Skin: warm and dry  Neuro:  no focal abnormalities noted Psych:  Normal affect    Relevant CV Studies:  Laboratory Data:  High Sensitivity Troponin:  No results for input(s): TROPONINIHS in the last 720 hours.    Chemistry Recent Labs  Lab 08/16/19  1130  NA 140  K 2.6*    CL 88*  CO2 34*  GLUCOSE 187*  BUN 45*  CREATININE 2.60*  CALCIUM 8.5*  GFRNONAA 25*  GFRAA 28*    No results for input(s): PROT, ALBUMIN, AST, ALT, ALKPHOS, BILITOT in the last 168 hours. Hematology Recent Labs  Lab 08/16/19 1130  WBC 12.0*  RBC 4.59  HGB 14.4  HCT 41.2  MCV 90  MCH 31.4  MCHC 35.0  RDW 13.6  PLT 252   BNPNo results for input(s): BNP, PROBNP in the last 168 hours.  DDimer No results for input(s): DDIMER in the last 168 hours.   Radiology/Studies:  No results found.  Assessment and Plan:   1. CRT-P     Device has reached ERI, known to have high LV thresholds  Planned for generator change and possible LV lead revision  Await labs     For questions or updates, please contact [place] Please consult [email or web address] for contact info under        Signed, Baldwin Jamaica, PA-C  08/19/2019 12:35 PM   I have seen, examined the patient, and reviewed the above assessment and plan.  Changes to above are made where necessary.  On exam, RRR.   He is chronically ill with multiple medical issues.  He has chronic systolic dysfunction with EF 35-40%.  He does not meet criteria for ICD.  He has done reasonably well with CRT -P but has recently had elevated LV threshold.  He is now ERI.  I have advised new LV lead placement with left bundle pacing rather than an additional coronary sinus lead. He is being admited for repletion of K, treatment of gout, and overall failure to thrive at home. Risks, benefits, alternatives to pacemaker system revision with new left bundle pacing lead were discussed in detail with the patient today. The patient understands that the risks include but are not limited to bleeding, infection, pneumothorax, perforation, tamponade, vascular damage, renal failure, MI, stroke, death,  and lead dislodgement and wishes to proceed.  [name] MD, [place] 08/19/2019 2:09 PM

## 2019-08-19 NOTE — Interval H&P Note (Signed)
History and Physical Interval Note:  08/19/2019 2:10 PM  Kurt Cross  has presented today for surgery, with the diagnosis of ERI.  The various methods of treatment have been discussed with the patient and family. After consideration of risks, benefits and other options for treatment, the patient has consented to  Procedure(s): PPM GENERATOR CHANGEOUT (N/A) LEAD REVISION/REPAIR (N/A) as a surgical intervention.  The patient's history has been reviewed, patient examined, no change in status, stable for surgery.  I have reviewed the patient's chart and labs.  Questions were answered to the patient's satisfaction.     [name]

## 2019-08-20 ENCOUNTER — Other Ambulatory Visit (HOSPITAL_COMMUNITY): Payer: Self-pay

## 2019-08-20 ENCOUNTER — Other Ambulatory Visit: Payer: Self-pay | Admitting: Physician Assistant

## 2019-08-20 ENCOUNTER — Ambulatory Visit (HOSPITAL_COMMUNITY): Payer: Medicare HMO

## 2019-08-20 ENCOUNTER — Encounter (HOSPITAL_COMMUNITY): Payer: Self-pay | Admitting: Internal Medicine

## 2019-08-20 DIAGNOSIS — I428 Other cardiomyopathies: Secondary | ICD-10-CM

## 2019-08-20 DIAGNOSIS — M2550 Pain in unspecified joint: Secondary | ICD-10-CM | POA: Diagnosis not present

## 2019-08-20 DIAGNOSIS — E1122 Type 2 diabetes mellitus with diabetic chronic kidney disease: Secondary | ICD-10-CM | POA: Diagnosis not present

## 2019-08-20 DIAGNOSIS — I4819 Other persistent atrial fibrillation: Secondary | ICD-10-CM | POA: Diagnosis not present

## 2019-08-20 DIAGNOSIS — Z45010 Encounter for checking and testing of cardiac pacemaker pulse generator [battery]: Secondary | ICD-10-CM | POA: Diagnosis not present

## 2019-08-20 DIAGNOSIS — Z7401 Bed confinement status: Secondary | ICD-10-CM | POA: Diagnosis not present

## 2019-08-20 DIAGNOSIS — N184 Chronic kidney disease, stage 4 (severe): Secondary | ICD-10-CM | POA: Diagnosis not present

## 2019-08-20 DIAGNOSIS — I5042 Chronic combined systolic (congestive) and diastolic (congestive) heart failure: Secondary | ICD-10-CM

## 2019-08-20 DIAGNOSIS — E876 Hypokalemia: Secondary | ICD-10-CM

## 2019-08-20 DIAGNOSIS — N183 Chronic kidney disease, stage 3 unspecified: Secondary | ICD-10-CM | POA: Diagnosis not present

## 2019-08-20 DIAGNOSIS — M5489 Other dorsalgia: Secondary | ICD-10-CM | POA: Diagnosis not present

## 2019-08-20 DIAGNOSIS — M109 Gout, unspecified: Secondary | ICD-10-CM | POA: Diagnosis not present

## 2019-08-20 DIAGNOSIS — R0902 Hypoxemia: Secondary | ICD-10-CM | POA: Diagnosis not present

## 2019-08-20 DIAGNOSIS — I130 Hypertensive heart and chronic kidney disease with heart failure and stage 1 through stage 4 chronic kidney disease, or unspecified chronic kidney disease: Secondary | ICD-10-CM | POA: Diagnosis not present

## 2019-08-20 DIAGNOSIS — E11621 Type 2 diabetes mellitus with foot ulcer: Secondary | ICD-10-CM | POA: Diagnosis not present

## 2019-08-20 DIAGNOSIS — I5020 Unspecified systolic (congestive) heart failure: Secondary | ICD-10-CM | POA: Diagnosis not present

## 2019-08-20 DIAGNOSIS — I5022 Chronic systolic (congestive) heart failure: Secondary | ICD-10-CM | POA: Diagnosis not present

## 2019-08-20 DIAGNOSIS — Z950 Presence of cardiac pacemaker: Secondary | ICD-10-CM | POA: Diagnosis not present

## 2019-08-20 DIAGNOSIS — W19XXXA Unspecified fall, initial encounter: Secondary | ICD-10-CM | POA: Diagnosis not present

## 2019-08-20 LAB — BASIC METABOLIC PANEL
Anion gap: 14 (ref 5–15)
BUN: 37 mg/dL — ABNORMAL HIGH (ref 8–23)
CO2: 29 mmol/L (ref 22–32)
Calcium: 7.9 mg/dL — ABNORMAL LOW (ref 8.9–10.3)
Chloride: 87 mmol/L — ABNORMAL LOW (ref 98–111)
Creatinine, Ser: 2.12 mg/dL — ABNORMAL HIGH (ref 0.61–1.24)
GFR calc Af Amer: 36 mL/min — ABNORMAL LOW (ref >60)
GFR calc non Af Amer: 31 mL/min — ABNORMAL LOW (ref >60)
Glucose, Bld: 228 mg/dL — ABNORMAL HIGH (ref 70–99)
Potassium: 2.6 mmol/L — CL (ref 3.5–5.1)
Sodium: 130 mmol/L — ABNORMAL LOW (ref 135–145)

## 2019-08-20 LAB — GLUCOSE, CAPILLARY
Glucose-Capillary: 260 mg/dL — ABNORMAL HIGH (ref 70–99)
Glucose-Capillary: 200 mg/dL — ABNORMAL HIGH (ref 70–99)
Glucose-Capillary: 210 mg/dL — ABNORMAL HIGH (ref 70–99)
Glucose-Capillary: 259 mg/dL — ABNORMAL HIGH (ref 70–99)

## 2019-08-20 LAB — MAGNESIUM: Magnesium: 1.4 mg/dL — ABNORMAL LOW (ref 1.7–2.4)

## 2019-08-20 MED ORDER — WARFARIN SODIUM 10 MG PO TABS: 10.0000 mg | ORAL_TABLET | Freq: Every day | ORAL | 6 refills | Status: DC

## 2019-08-20 MED ORDER — POTASSIUM CHLORIDE CRYS ER 20 MEQ PO TBCR
40.0000 meq | EXTENDED_RELEASE_TABLET | Freq: Once | ORAL | Status: AC
  Administered 2019-08-20: 40 meq via ORAL
  Filled 2019-08-20: qty 2

## 2019-08-20 MED ORDER — COLCHICINE 0.6 MG PO TABS
0.6000 mg | ORAL_TABLET | Freq: Every day | ORAL | Status: DC
  Administered 2019-08-20: 0.6 mg via ORAL
  Filled 2019-08-20: qty 1

## 2019-08-20 MED ORDER — POTASSIUM CHLORIDE CRYS ER 20 MEQ PO TBCR: 60.0000 meq | EXTENDED_RELEASE_TABLET | Freq: Two times a day (BID) | ORAL | 1 refills | Status: AC

## 2019-08-20 MED ORDER — POTASSIUM CHLORIDE CRYS ER 20 MEQ PO TBCR
40.0000 meq | EXTENDED_RELEASE_TABLET | Freq: Once | ORAL | Status: AC
  Administered 2019-08-20: 14:00:00 40 meq via ORAL
  Filled 2019-08-20: qty 2

## 2019-08-20 MED ORDER — TORSEMIDE 20 MG PO TABS
20.0000 mg | ORAL_TABLET | Freq: Every day | ORAL | Status: DC
  Administered 2019-08-20: 09:00:00 20 mg via ORAL
  Filled 2019-08-20: qty 1

## 2019-08-20 MED ORDER — TORSEMIDE 20 MG PO TABS: 20.0000 mg | ORAL_TABLET | Freq: Every day | ORAL | 6 refills | Status: DC

## 2019-08-20 MED ORDER — MAGNESIUM SULFATE 4 GM/100ML IV SOLN
4.0000 g | Freq: Once | INTRAVENOUS | Status: AC
  Administered 2019-08-20: 12:00:00 4 g via INTRAVENOUS
  Filled 2019-08-20: qty 100

## 2019-08-20 MED ORDER — POTASSIUM CHLORIDE 10 MEQ/100ML IV SOLN
10.0000 meq | INTRAVENOUS | Status: DC
  Administered 2019-08-20: 10:00:00 10 meq via INTRAVENOUS
  Filled 2019-08-20: qty 100

## 2019-08-20 NOTE — Progress Notes (Signed)
Critical potassium level of 2.6 reported by lab. MD notified.

## 2019-08-20 NOTE — Discharge Summary (Addendum)
ELECTROPHYSIOLOGY PROCEDURE DISCHARGE SUMMARY    Patient ID: SIRANTHONY ERNANDEZ,  MRN: [id number], DOB/AGE: October 10, 1950 68 y.o.  Admit date: 08/19/2019 Discharge date: 08/20/2019  Primary Care Physician: [name], MD  Primary Cardiologist: Dr. Meda Coffee AHF: Dr. Aundra Dubin Electrophysiologist: Dr. Rayann Heman  Primary Discharge Diagnosis:  1. PPM generator depletion 2. Elevated LV lead threshold 3. Hypokalemia     Chronic of late 4. gout  Secondary Discharge Diagnosis:  1. Persistent AFib     CHA2DS2Vasc is 4, on coumadin, monitored and managed by coumadin clinic 2. NICM 3. Chronic CHF     Appears compensated 4. CKD (III) 5. HTN 6. Bed bound    No Known Allergies   Procedures This Admission:  1. 08/19/2019 CONCLUSIONS:  1. Nonischemic cardiomyopathy with Left bundle-branch block and chronic New York Heart Association class III heart failure.  2. Successful Medtronic biventricular pacemaker generator change 3. LV lead placement could not be revised today.  I therefore elected to use her prior LV lead, though with chronically elevated threshold 4. No early apparent complications.  2.  CXR on 09/19/2019 demonstrated no pneumothorax status post device implantation.   Brief HPI: JULIANN KEELEY is a 68 y.o. male is followed  Outpatient, his PPM noted to have reached ERI, his LV lead known to have elevated thresholds.  Planned to come in for generator replacement and LV lead revision.  Risks, benefits, and alternatives to PPM implantation were reviewed with the patient who wished to proceed.   Hospital Course:  The patient was noted pre-op labs to have hypokalemia, this has been an issue for him for the last several weeks, and addressed by the AHF clinic.  Once here he mentioned though that he had run out ofhis K+ at home and not taken any for 5 days, let alone the additional doses instructed.  He did say though that his wife had since picked it up and was at home.  The day  prior to his arrival evaluated in the ER for gout flare and started on colchicine for this.   His BMET yesterday with K+ of 2.9 and given replacement and his procedure pursued.  Unfortunately his LV lead was unable to be revised, and underwent generator replacement.  BMET today noted K+ 2.6 and mag 1.4.  We reached out to Dr. Aundra Dubin for his K+ and diuretic recommendation, he recommended replacement with 28meq PO, 74meq IV, increase home K+ to 50meq BID to start tonight, reduce his torsemide to 20mg  daily given that was was the patient reported he was doing up to now anyway. The pt refused the last 3 runs of potassium and was given a second PO dose, his mag was replaced as well He was monitored on telemetry noting aflutter/VPaced rhythm.  Left chest was without hematoma or ecchymosis.  The device was interrogated and found to be functioning normally (chronic leads).  CXR was obtained and demonstrated no pneumothorax status post device implantation. Noting no active pulmonary process.  He was noted to de-sat to high 80's with sleep, when awake on RA 95-99%, and strongly suspect sleep apnea.  This planned to be pursued out patient.  I have sent a staff message to EP nurse to start this process.   Wound care was reviewed with the patient.  The patient was examined by Dr. Rayann Heman and considered stable for discharge to home.   He has labs scheduled next week at the [facility] and coumadin clinic in place as well. He is  instructed to follow up with his PMD for his gout management, continue tx as prescribed via the ER yesterday   Physical Exam: Vitals:   08/19/19 2000 08/19/19 2100 08/20/19 0352 08/20/19 0900  BP: (!) 183/100 (!) 178/87 126/67 (!) 147/76  Pulse:  74  71  Resp:  20    Temp:  98 F (36.7 C) 99.1 F (37.3 C)   TempSrc:  Oral Oral   SpO2: 98% 96% 98% 98%  Weight:   126.9 kg   Height:        GEN- The patient is well appearing, alert and oriented x 3 today.   HEENT: normocephalic, atraumatic;  sclera clear, conjunctiva pink; hearing intact; oropharynx clear; neck supple, no JVP Lungs- CTA b/l, normal work of breathing.  No wheezes, rales, rhonchi Heart- RRR, no murmurs, rubs or gallops, PMI not laterally displaced GI- soft, non-tender, non-distended,obese Extremities- no clubbing, cyanosis, or edema MS- no significant deformity or atrophy.  He has some swelling of his hands, elbows, slightly warm, no erythema Skin- warm and dry, no rash or lesion, left chest without hematoma/ecchymosis Psych- euthymic mood, full affect Neuro- no gross deficits   Labs:   Lab Results  Component Value Date   WBC 12.2 (H) 08/19/2019   HGB 14.3 08/19/2019   HCT 41.2 08/19/2019   MCV 93.0 08/19/2019   PLT 242 08/19/2019    Recent Labs  Lab 08/20/19 0715  NA 130*  K 2.6*  CL 87*  CO2 29  BUN 37*  CREATININE 2.12*  CALCIUM 7.9*  GLUCOSE 228*    Discharge Medications:  Allergies as of 08/20/2019   No Known Allergies     Medication List    TAKE these medications   Accu-Chek Aviva device Use to test blood sugars 1-2 times daily.   Accu-Chek Aviva Plus test strip Generic drug: glucose blood Use to test blood sugars 1-2 times daily.   Accu-Chek Softclix Lancets lancets Use to test blood sugars 1-2 times daily.   colchicine 0.6 MG tablet Take 1 tablet (0.6 mg total) by mouth daily.   gabapentin 300 MG capsule Commonly known as: NEURONTIN Take 1 capsule (300 mg total) by mouth 3 (three) times daily.   insulin NPH Human 100 UNIT/ML injection Commonly known as: NOVOLIN N Inject 4-8 Units into the skin See admin instructions. If blood glucose is over 120, inject 7-8 units. If blood glucose is below 120, inject 4-5 units.   insulin regular 100 units/mL injection Commonly known as: NOVOLIN R Use with sliding scale. What changed:   how much to take  how to take this  when to take this  additional instructions   isosorbide-hydrALAZINE 20-37.5 MG tablet Commonly known  as: BIDIL Take 0.5 tablets by mouth 3 (three) times daily. What changed: when to take this   potassium chloride SA 20 MEQ tablet Commonly known as: KLOR-CON Take 3 tablets (60 mEq total) by mouth 2 (two) times daily. Please cancel all previous orders for current medication. Change in dosage or pill size. What changed:   how much to take  when to take this Notes to patient: You MUST have your blood drawn as scheduled next week at the heart failure clinic to follow up on this and adjust your dose as needed based on that lab result.  This is very important   rosuvastatin 10 MG tablet Commonly known as: CRESTOR Take 1 tablet (10 mg total) by mouth daily.   torsemide 20 MG tablet Commonly known as: DEMADEX  Take 1 tablet (20 mg total) by mouth daily. Start taking on: August 21, 2019   traZODone 50 MG tablet Commonly known as: DESYREL Take 2 tablets (100 mg total) by mouth at bedtime as needed for sleep.   warfarin 10 MG tablet Commonly known as: Coumadin Take as directed. If you are unsure how to take this medication, talk to your nurse or doctor. Original instructions: Take 1 tablet (10 mg total) by mouth daily. Take one tablet (10mg ) every day EXCEPT Saturday, take 1/2 tablet (5mg ), continue as directed by the coumadin clinic       Disposition: Home    Discharge Instructions    Diet - low sodium heart healthy   Complete by: As directed    Increase activity slowly   Complete by: As directed      Follow-up Information    [facility] Follow up.   Specialty: Cardiology Why: 08/26/2019 @ 8:30AM, coumadinc clinic 08/31/2019 @ 11:30AM, wound check visit Contact information: [address] [place] [phone]       [name], MD Follow up.   Specialty: Cardiology Why: 11/25/2019 @ 1:45PM Contact information: [address] [place] [address] [phone]         [facility] Follow up.   Specialty: Cardiology Why: 08/23/2019 @ 2:00PM, labs Contact information: [address] [id number] [place] [phone]          Duration of Discharge Encounter: Greater than 30 minutes including physician time.  SignedTommye Standard, PA-C 08/20/2019 2:26 PM   Device interrogation is reviewed and normal.  CXR reveals no ptx.  Pt with ongoing issues with low potassium.  Extensive conversations with pharmacy and Dr Aundra Dubin about treatment.  Plan as above.  Will need close follow-up in the CHF clinic.  Routine wound care and follow-up.  [name] MD, [place] 08/20/2019 9:24 PM

## 2019-08-20 NOTE — Progress Notes (Signed)
error 

## 2019-08-20 NOTE — Discharge Instructions (Signed)
Post procedure care instructions Keep incision clean and dry for 10 days. No driving for 2 days.  You can remove outer dressing tomorrow. Leave steri-strips (little pieces of tape) on until seen in the office for wound check appointment. Call the office [phone]) for redness, drainage, swelling, or fever.

## 2019-08-20 NOTE — Telephone Encounter (Signed)
Addressed in the hospital. Lab appt scheduled.

## 2019-08-20 NOTE — Progress Notes (Signed)
Patient seen this AM buy Dr. Rayann Heman AM labs pending, planned to discuss with Dr. Aundra Dubin once back Otherwise planned to discharge back home today. Procedure site is stable, device check this AM stable, CXR without ptx  K+ 2.6  (this has been a chronic issue for serveral weeks) Creat 2.12 Mag 1.4  D/w Dr. Aundra Dubin lab, and pt reports of not taking K+ at home 5 days, labs yesterday/replacement and today's lab Potassium 32meq PO now and start 4 18meq IV runs  For home Pt reported only taking 20mg  torsemide, continue this Change home potassium to 60mg  BID 1st dose of this tonight BMET in 1 week Discussed nocturnal desat Noted RA sat this am while awake and talking to Dr. Rayann Heman, 97% Plan for out patient sleep study  I have also ordered Mag replacement though historically his mag has not been a problem for him, will not order for home  I still anticipate discharge home today once K= runs are completed Colchicine ordered for his gout, to continue home and f/u with his PMD for this  Tommye Standard, PA-C

## 2019-08-20 NOTE — Progress Notes (Signed)
CSW informed by RN that patient will need [facility] home, identified that address on facesheet is accurate. CSW has called [facility] for transport, forms on chart. No further needs identified at this time.   [place], [place]

## 2019-08-23 ENCOUNTER — Other Ambulatory Visit (HOSPITAL_COMMUNITY): Payer: Self-pay | Admitting: *Deleted

## 2019-08-23 ENCOUNTER — Other Ambulatory Visit (HOSPITAL_COMMUNITY): Payer: Medicare HMO

## 2019-08-23 DIAGNOSIS — I5042 Chronic combined systolic (congestive) and diastolic (congestive) heart failure: Secondary | ICD-10-CM

## 2019-08-25 ENCOUNTER — Other Ambulatory Visit: Payer: Self-pay | Admitting: Internal Medicine

## 2019-08-26 ENCOUNTER — Other Ambulatory Visit: Payer: Self-pay

## 2019-08-26 ENCOUNTER — Ambulatory Visit (INDEPENDENT_AMBULATORY_CARE_PROVIDER_SITE_OTHER): Payer: Medicare HMO | Admitting: *Deleted

## 2019-08-26 DIAGNOSIS — Z5181 Encounter for therapeutic drug level monitoring: Secondary | ICD-10-CM | POA: Diagnosis not present

## 2019-08-26 DIAGNOSIS — I4821 Permanent atrial fibrillation: Secondary | ICD-10-CM

## 2019-08-26 LAB — POCT INR: INR: 1.5 — AB (ref 2.0–3.0)

## 2019-08-26 NOTE — Patient Instructions (Signed)
Description   Take 1.5 tablets today and tomorrow, then continue to take 1 tablet daily except for 1/2 a tablet on Saturday. Call coumadin clinic [phone] for any questions or upcoming procedures.

## 2019-08-30 ENCOUNTER — Other Ambulatory Visit: Payer: Self-pay

## 2019-08-30 NOTE — Patient Outreach (Addendum)
[facility]) Care Management  08/30/2019  LISSANDRO CROCKER Jun 11, 1951 [id number]   Telephone call to wife Shanti Briar for follow up. No answer.  Unable to leave a message.  Plan: RN CM will contact patient again within 4 business days and send letter.  [name], RN, MSN [facility] Care Management Coordinator Direct Line [phone] Cell [phone] Toll Free: [phone]  Fax: [phone]

## 2019-08-31 ENCOUNTER — Other Ambulatory Visit: Payer: Self-pay

## 2019-08-31 ENCOUNTER — Ambulatory Visit (INDEPENDENT_AMBULATORY_CARE_PROVIDER_SITE_OTHER): Payer: Medicare HMO | Admitting: *Deleted

## 2019-08-31 DIAGNOSIS — I428 Other cardiomyopathies: Secondary | ICD-10-CM

## 2019-08-31 NOTE — Patient Instructions (Signed)
Call office if you develop any increased edema, redness, or drainage at incision site.

## 2019-08-31 NOTE — Patient Outreach (Signed)
[facility]) Care Management  08/31/2019  Kurt Cross Aug 22, 1951 [id number]   Telephone call to patient for follow up.  No answer. Unable to leave a message.   Plan: RN CM will attempt patient again in the next 4 business days.    [name], RN, MSN [facility] Care Management Coordinator Direct Line [phone] Cell [phone] Toll Free: [phone]  Fax: [phone]

## 2019-09-01 ENCOUNTER — Other Ambulatory Visit: Payer: Self-pay

## 2019-09-01 NOTE — Patient Outreach (Signed)
[place] [facility]) Care Management  09/01/2019  ESAIAH DELBENE 10/19/1950 [id number]   Telephone call to patient for follow up.  No answer. Unable to leave a message.   Plan: RN CM will attempt patient again next month.  [name], RN, MSN [place] [facility] Care Management Coordinator Direct Line [phone] Cell [phone] Toll Free: [phone]  Fax: [phone]

## 2019-09-03 ENCOUNTER — Other Ambulatory Visit: Payer: Self-pay | Admitting: Family Medicine

## 2019-09-03 DIAGNOSIS — N184 Chronic kidney disease, stage 4 (severe): Secondary | ICD-10-CM | POA: Diagnosis not present

## 2019-09-03 DIAGNOSIS — E1140 Type 2 diabetes mellitus with diabetic neuropathy, unspecified: Secondary | ICD-10-CM | POA: Diagnosis not present

## 2019-09-03 DIAGNOSIS — I4892 Unspecified atrial flutter: Secondary | ICD-10-CM | POA: Diagnosis not present

## 2019-09-03 DIAGNOSIS — E1122 Type 2 diabetes mellitus with diabetic chronic kidney disease: Secondary | ICD-10-CM | POA: Diagnosis not present

## 2019-09-03 DIAGNOSIS — M109 Gout, unspecified: Secondary | ICD-10-CM | POA: Diagnosis not present

## 2019-09-03 DIAGNOSIS — J9611 Chronic respiratory failure with hypoxia: Secondary | ICD-10-CM | POA: Diagnosis not present

## 2019-09-03 DIAGNOSIS — I5042 Chronic combined systolic (congestive) and diastolic (congestive) heart failure: Secondary | ICD-10-CM | POA: Diagnosis not present

## 2019-09-03 DIAGNOSIS — I4891 Unspecified atrial fibrillation: Secondary | ICD-10-CM | POA: Diagnosis not present

## 2019-09-03 DIAGNOSIS — I130 Hypertensive heart and chronic kidney disease with heart failure and stage 1 through stage 4 chronic kidney disease, or unspecified chronic kidney disease: Secondary | ICD-10-CM | POA: Diagnosis not present

## 2019-09-03 NOTE — Telephone Encounter (Signed)
Patient requesting warfarin (COUMADIN) 10 MG tablet will run out today, informed patient of the turn around time. Patient states he will be in trouble if refill request is not honored today, please advise best # [phone]  `

## 2019-09-03 NOTE — Telephone Encounter (Signed)
Requested medication (s) are due for refill today: yes  Requested medication (s) are on the active medication list: yes  Last refill:  08/20/2019  Future visit scheduled: no  Notes to clinic:  Patient will run out of medication today Refill cannot be delegated    Requested Prescriptions  Pending Prescriptions Disp Refills   warfarin (COUMADIN) 10 MG tablet 30 tablet 6    Sig: Take 1 tablet (10 mg total) by mouth daily. Take one tablet (10mg ) every day EXCEPT Saturday, take 1/2 tablet (5mg ), continue as directed by the coumadin clinic     Hematology:  Anticoagulants - warfarin Failed - 09/03/2019  1:25 PM      Failed - This refill cannot be delegated      Failed - If the patient is managed by Coumadin Clinic - route to their Pool. If not, forward to the provider.      Failed - INR in normal range and within 30 days    INR  Date Value Ref Range Status  08/26/2019 1.5 (A) 2.0 - 3.0 Final  08/19/2019 1.6 (H) 0.8 - 1.2 Final    Comment:    (NOTE) INR goal varies based on device and disease states. Performed at [facility], [place] [address] [place], [place] [address]          Passed - Valid encounter within last 3 months    Recent Outpatient Visits          1 month ago Gouty arthritis   [facility], Mortimer Fries, MD   5 months ago Acute on chronic congestive heart failure, unspecified heart failure type [facility])   [facility], Mortimer Fries, MD   9 months ago Diabetes mellitus, insulin dependent (IDDM), uncontrolled ([place])   [facility], Mortimer Fries, MD   1 year ago Chronic combined systolic and diastolic CHF (congestive heart failure) [facility])   [facility], Mortimer Fries, MD   1 year ago Chronic combined systolic and diastolic CHF (congestive heart failure) ([place])   [facility], Mortimer Fries, MD      Future Appointments             In 2 weeks Turner, Eber Hong, MD [facility]

## 2019-09-06 ENCOUNTER — Encounter (HOSPITAL_COMMUNITY): Payer: Medicare HMO

## 2019-09-06 MED ORDER — WARFARIN SODIUM 10 MG PO TABS: 10.0000 mg | ORAL_TABLET | Freq: Every day | ORAL | 6 refills | Status: AC

## 2019-09-09 ENCOUNTER — Ambulatory Visit (INDEPENDENT_AMBULATORY_CARE_PROVIDER_SITE_OTHER): Payer: Medicare HMO | Admitting: *Deleted

## 2019-09-09 DIAGNOSIS — I130 Hypertensive heart and chronic kidney disease with heart failure and stage 1 through stage 4 chronic kidney disease, or unspecified chronic kidney disease: Secondary | ICD-10-CM | POA: Diagnosis not present

## 2019-09-09 DIAGNOSIS — I5042 Chronic combined systolic (congestive) and diastolic (congestive) heart failure: Secondary | ICD-10-CM | POA: Diagnosis not present

## 2019-09-09 DIAGNOSIS — I4891 Unspecified atrial fibrillation: Secondary | ICD-10-CM | POA: Diagnosis not present

## 2019-09-09 DIAGNOSIS — E1122 Type 2 diabetes mellitus with diabetic chronic kidney disease: Secondary | ICD-10-CM | POA: Diagnosis not present

## 2019-09-09 DIAGNOSIS — N184 Chronic kidney disease, stage 4 (severe): Secondary | ICD-10-CM | POA: Diagnosis not present

## 2019-09-09 DIAGNOSIS — E1140 Type 2 diabetes mellitus with diabetic neuropathy, unspecified: Secondary | ICD-10-CM | POA: Diagnosis not present

## 2019-09-09 DIAGNOSIS — Z950 Presence of cardiac pacemaker: Secondary | ICD-10-CM | POA: Diagnosis not present

## 2019-09-09 DIAGNOSIS — M109 Gout, unspecified: Secondary | ICD-10-CM | POA: Diagnosis not present

## 2019-09-09 DIAGNOSIS — J9611 Chronic respiratory failure with hypoxia: Secondary | ICD-10-CM | POA: Diagnosis not present

## 2019-09-09 DIAGNOSIS — I4892 Unspecified atrial flutter: Secondary | ICD-10-CM | POA: Diagnosis not present

## 2019-09-09 LAB — CUP PACEART REMOTE DEVICE CHECK
Date Time Interrogation Session: 20201203160911
Implantable Lead Implant Date: 20080911
Implantable Lead Implant Date: 20080911
Implantable Lead Implant Date: 20080911
Implantable Lead Location: 753858
Implantable Lead Location: 753859
Implantable Lead Location: 753860
Implantable Lead Model: 4194
Implantable Lead Model: 5076
Implantable Lead Model: 5076
Implantable Pulse Generator Implant Date: 20150724

## 2019-09-13 ENCOUNTER — Ambulatory Visit (HOSPITAL_COMMUNITY)
Admission: RE | Admit: 2019-09-13 | Discharge: 2019-09-13 | Disposition: A | Discharge status: Home / Self Care | Payer: Medicare HMO | Source: Ambulatory Visit | Attending: Cardiology | Admitting: Cardiology

## 2019-09-13 ENCOUNTER — Ambulatory Visit (HOSPITAL_BASED_OUTPATIENT_CLINIC_OR_DEPARTMENT_OTHER)
Admission: RE | Admit: 2019-09-13 | Discharge: 2019-09-13 | Disposition: A | Discharge status: Home / Self Care | Payer: Medicare HMO | Source: Ambulatory Visit | Attending: Cardiology | Admitting: Cardiology

## 2019-09-13 ENCOUNTER — Other Ambulatory Visit: Payer: Self-pay

## 2019-09-13 ENCOUNTER — Telehealth (HOSPITAL_COMMUNITY): Payer: Self-pay | Admitting: Cardiology

## 2019-09-13 ENCOUNTER — Encounter (HOSPITAL_COMMUNITY): Payer: Self-pay

## 2019-09-13 VITALS — BP 128/73 | HR 61

## 2019-09-13 DIAGNOSIS — I428 Other cardiomyopathies: Secondary | ICD-10-CM | POA: Insufficient documentation

## 2019-09-13 DIAGNOSIS — E876 Hypokalemia: Secondary | ICD-10-CM | POA: Diagnosis not present

## 2019-09-13 DIAGNOSIS — Z8249 Family history of ischemic heart disease and other diseases of the circulatory system: Secondary | ICD-10-CM | POA: Insufficient documentation

## 2019-09-13 DIAGNOSIS — E1122 Type 2 diabetes mellitus with diabetic chronic kidney disease: Secondary | ICD-10-CM | POA: Insufficient documentation

## 2019-09-13 DIAGNOSIS — Z811 Family history of alcohol abuse and dependence: Secondary | ICD-10-CM | POA: Diagnosis not present

## 2019-09-13 DIAGNOSIS — I5022 Chronic systolic (congestive) heart failure: Secondary | ICD-10-CM | POA: Insufficient documentation

## 2019-09-13 DIAGNOSIS — M7989 Other specified soft tissue disorders: Secondary | ICD-10-CM | POA: Diagnosis not present

## 2019-09-13 DIAGNOSIS — I5042 Chronic combined systolic (congestive) and diastolic (congestive) heart failure: Secondary | ICD-10-CM | POA: Diagnosis not present

## 2019-09-13 DIAGNOSIS — Z79899 Other long term (current) drug therapy: Secondary | ICD-10-CM | POA: Insufficient documentation

## 2019-09-13 DIAGNOSIS — E669 Obesity, unspecified: Secondary | ICD-10-CM | POA: Diagnosis not present

## 2019-09-13 DIAGNOSIS — I442 Atrioventricular block, complete: Secondary | ICD-10-CM | POA: Insufficient documentation

## 2019-09-13 DIAGNOSIS — I712 Thoracic aortic aneurysm, without rupture: Secondary | ICD-10-CM | POA: Insufficient documentation

## 2019-09-13 DIAGNOSIS — Z7901 Long term (current) use of anticoagulants: Secondary | ICD-10-CM | POA: Insufficient documentation

## 2019-09-13 DIAGNOSIS — I2510 Atherosclerotic heart disease of native coronary artery without angina pectoris: Secondary | ICD-10-CM | POA: Insufficient documentation

## 2019-09-13 DIAGNOSIS — I4821 Permanent atrial fibrillation: Secondary | ICD-10-CM | POA: Diagnosis not present

## 2019-09-13 DIAGNOSIS — Z794 Long term (current) use of insulin: Secondary | ICD-10-CM | POA: Diagnosis not present

## 2019-09-13 DIAGNOSIS — M109 Gout, unspecified: Secondary | ICD-10-CM | POA: Diagnosis not present

## 2019-09-13 DIAGNOSIS — Z87891 Personal history of nicotine dependence: Secondary | ICD-10-CM | POA: Diagnosis not present

## 2019-09-13 DIAGNOSIS — N183 Chronic kidney disease, stage 3 unspecified: Secondary | ICD-10-CM | POA: Diagnosis not present

## 2019-09-13 DIAGNOSIS — G4733 Obstructive sleep apnea (adult) (pediatric): Secondary | ICD-10-CM | POA: Diagnosis not present

## 2019-09-13 DIAGNOSIS — Z833 Family history of diabetes mellitus: Secondary | ICD-10-CM | POA: Diagnosis not present

## 2019-09-13 LAB — PROTIME-INR
INR: 3 — ABNORMAL HIGH (ref 0.8–1.2)
Prothrombin Time: 30.8 seconds — ABNORMAL HIGH (ref 11.4–15.2)

## 2019-09-13 LAB — BASIC METABOLIC PANEL
Anion gap: 7 (ref 5–15)
BUN: 20 mg/dL (ref 8–23)
CO2: 24 mmol/L (ref 22–32)
Calcium: 8.4 mg/dL — ABNORMAL LOW (ref 8.9–10.3)
Chloride: 109 mmol/L (ref 98–111)
Creatinine, Ser: 1.93 mg/dL — ABNORMAL HIGH (ref 0.61–1.24)
GFR calc Af Amer: 41 mL/min — ABNORMAL LOW (ref >60)
GFR calc non Af Amer: 35 mL/min — ABNORMAL LOW (ref >60)
Glucose, Bld: 127 mg/dL — ABNORMAL HIGH (ref 70–99)
Potassium: 4.6 mmol/L (ref 3.5–5.1)
Sodium: 140 mmol/L (ref 135–145)

## 2019-09-13 LAB — MAGNESIUM: Magnesium: 1.7 mg/dL (ref 1.7–2.4)

## 2019-09-13 MED ORDER — TORSEMIDE 20 MG PO TABS: 40.0000 mg | ORAL_TABLET | Freq: Every day | ORAL | 6 refills | Status: DC

## 2019-09-13 NOTE — Progress Notes (Signed)
VASCULAR LAB PRELIMINARY  PRELIMINARY  PRELIMINARY  PRELIMINARY  Left upper extremity venous duplex completed.    Preliminary report:  See CV proc for preliminary results.  Cheney Ewart, RVT 09/13/2019, 2:35 PM

## 2019-09-13 NOTE — Telephone Encounter (Signed)
Notes recorded by Kerry Dory, CMA on 09/13/2019 at 4:50 PM EST  Order placed and added to appt at [facility] 12/15  ------   Notes recorded by Consuelo Pandy, PA-C on 09/13/2019 at 4:19 PM EST  Renal function and K improved but will still advise that he increase his torsemide to 40 mg daily (as originally advised by Dr. Aundra Dubin) this will help with his residual edema. Repeat BMP again in 7-10 days.

## 2019-09-13 NOTE — Patient Instructions (Addendum)
INCREASE Torsemide to 40 mg (2 tabs) daily  Labs today We will only contact you if something comes back abnormal or we need to make some changes. Otherwise no news is good news!  You have been referred to [facility] for further evaluation of sleep medicine with Dr Radford Pax [place]  Your physician recommends that you schedule a follow-up appointment in: 2 week  in the Advanced Practitioners (PA/NP) Clinic   Non-Cardiac CT scanning, (CAT scanning), is a noninvasive, special x-[place] that produces cross-sectional images of the body using x-rays and a computer. CT scans help physicians diagnose and treat medical conditions. For some CT exams, a contrast material is used to enhance visibility in the area of the body being studied. CT scans provide greater clarity and reveal more details than regular x-[place] exams.  Your physician has requested that you have a lower or upper extremity arterial duplex. This test is an ultrasound of the arteries in the legs or arms. It looks at arterial blood flow in the legs and arms. Allow one hour for Lower and Upper Arterial scans. There are no restrictions or special instructions  Do the following things EVERYDAY: 1) Weigh yourself in the morning before breakfast. Write it down and keep it in a log. 2) Take your medicines as prescribed 3) Eat low salt foods-Limit salt (sodium) to 2000 mg per day.  4) Stay as active as you can everyday 5) Limit all fluids for the day to less than 2 liters

## 2019-09-13 NOTE — Progress Notes (Addendum)
Advanced Heart Failure Clinic Note    PCP: [name], MD HF Cardiology: Dr. Aundra Dubin  68 y.o. with history of permanent atrial fibrillation, Nonischemic cardiomyopathy, and complete heart block was referred by Dr. Rayann Heman for CHF evaluation. He has a long history of nonischemic cardiomyopathy, cath in 2015 showed nonobstructive CAD.  Most recent echo in 4/20 showed EF 35-40%.  He has a Medtronic CRT-P device implanted initially for complete heart block. He has a ventral incisional hernia that has caused a lot of problems and has required multiple surgeries. He is on coumadin and INRs followed by [facility].   Patient is not very active.  Since his hernia surgeries, he has remained essentially wheelchair-bound. He is working with PT.  No dyspnea wheeling his chair or with transfers.  No chest pain, no orthopnea/PND.  He has been diagnosed with OSA in the past but is not on CPAP.  He had been on an unusual diuretic regimen with Lasix 20 mg daily and metolazone 2.5 mg daily. He was also having issues w/ persistent hypokalemia w/ metolazone. Dr. Aundra Dubin saw him on 11/4 and changed diuretic regimen. Lasix and metolazone were both discontinued. He was instructed to take torsemide 40 mg daily (only has been taking 20 mg daily).   His device also recently reached ERI. Also found to have chronically elevated LV lead threshold. On 11/12, he underwent gen change. Dr. Rayann Heman attempted pacemaker system revision but with failed LV lead placement and left bundle pacing attempts. Unable to revise. Had gen change out only.   He presents back to clinic today for f/u. Feels a bit better after diuretic change. Feels that he is urinating more w/ torsemide. Unfortunately, unable to stand for weight (no wt documented at last clinic visit). Denies resting dyspnea. No orthopnea/ PND. Tolerating torsemide ok w/o side effects. BP stable. He thinks his LEE has improved. There is trace bilateral LEE on exam  today. He mostly complains of left upper extremity swelling, that has been present since his gen change out. The swelling is significant. Mild discomfort. Reports compliance w/ coumadin.    ECG (personally reviewed): not done today. Pulse rate WNL.   Labs (10/20): LDL 58, HDL 21, K 2.6, creatinine 2.45   PMH: 1. Permanent atrial fibrillation.  2. Ascending aortic aneurysm: 4.5 cm on 10/19 CTA.  3. Ventral incisional hernia: Has required multiple surgeries.  4. Obesity 5. CKD: Stage 3.  6. Gout  7. Type 2 diabetes 8. Chronic systolic CHF: Nonischemic cardiomyopathy with Medtronic CRT-P.  - LHC (2015): Nonobstructive CAD.  - Echo (2017): EF 30-35% - Echo (4/20): EF 35-40%, moderate LVH, mildly increased RV size with normal systolic function.  9. Complete heart block: MDT CRT-P device.   Social History   Socioeconomic History  . Marital status: Married    Spouse name: Not on file  . Number of children: Not on file  . Years of education: Not on file  . Highest education level: Not on file  Occupational History  . Not on file  Social Needs  . Financial resource strain: Not on file  . Food insecurity    Worry: Never true    Inability: Never true  . Transportation needs    Medical: No    Non-medical: No  Tobacco Use  . Smoking status: Former Smoker    Packs/day: 1.00    Years: 40.00    Pack years: 40.00    Types: Cigarettes    Quit date:  08/21/2012    Years since quitting: 7.0  . Smokeless tobacco: Never Used  Substance and Sexual Activity  . Alcohol use: Not Currently    Alcohol/week: 0.0 standard drinks    Comment: occasional  . Drug use: No  . Sexual activity: Not on file  Lifestyle  . Physical activity    Days per week: Not on file    Minutes per session: Not on file  . Stress: Not on file  Relationships  . Social Herbalist on phone: Not on file    Gets together: Not on file    Attends religious service: Not on file    Active member of club or  organization: Not on file    Attends meetings of clubs or organizations: Not on file    Relationship status: Not on file  . Intimate partner violence    Fear of current or ex partner: Not on file    Emotionally abused: Not on file    Physically abused: Not on file    Forced sexual activity: Not on file  Other Topics Concern  . Not on file  Social History Narrative   Lives at [place] since 02/06/16   Married -[name]   Former smoker -stopped 2013   No Advance Directives   Family History  Problem Relation Age of Onset  . Diabetes Mother   . Alcohol abuse Mother   . Alcoholism Father   . Heart Problems Maternal Grandmother    ROS: All systems reviewed and negative except as per HPI.   Current Outpatient Medications  Medication Sig Dispense Refill  . Accu-Chek Softclix Lancets lancets Use to test blood sugars 1-2 times daily. 100 each 12  . Blood Glucose Monitoring Suppl (ACCU-CHEK AVIVA) device Use to test blood sugars 1-2 times daily. 1 each 0  . colchicine 0.6 MG tablet Take 1 tablet (0.6 mg total) by mouth daily. 30 tablet 0  . gabapentin (NEURONTIN) 300 MG capsule Take 1 capsule (300 mg total) by mouth 3 (three) times daily. 270 capsule 1  . glucose blood (ACCU-CHEK AVIVA PLUS) test strip Use to test blood sugars 1-2 times daily. 100 each 12  . insulin NPH Human (NOVOLIN N) 100 UNIT/ML injection Inject 4-8 Units into the skin See admin instructions. If blood glucose is over 120, inject 7-8 units. If blood glucose is below 120, inject 4-5 units.    . insulin regular (NOVOLIN R) 100 units/mL injection Use with sliding scale. 10 mL 11  . isosorbide-hydrALAZINE (BIDIL) 20-37.5 MG tablet Take 0.5 tablets by mouth 3 (three) times daily. 45 tablet 6  . potassium chloride SA (KLOR-CON) 20 MEQ tablet Take 3 tablets (60 mEq total) by mouth 2 (two) times daily. Please cancel all previous orders for current medication. Change in dosage or pill size. 180 tablet 1  . rosuvastatin (CRESTOR) 10  MG tablet Take 1 tablet (10 mg total) by mouth daily. 90 tablet 1  . torsemide (DEMADEX) 20 MG tablet Take 2 tablets (40 mg total) by mouth daily. 60 tablet 6  . traZODone (DESYREL) 50 MG tablet Take 2 tablets (100 mg total) by mouth at bedtime as needed for sleep. 90 tablet 1  . warfarin (COUMADIN) 10 MG tablet Take 1 tablet (10 mg total) by mouth daily. Take one tablet (10mg ) every day EXCEPT Saturday, take 1/2 tablet (5mg ), continue as directed by the coumadin clinic 30 tablet 6   No current facility-administered medications for this encounter.    BP 128/73  Pulse 61   SpO2 96%  PHYSICAL EXAM: General:  [name], wheel chair bound, chronically ill appearing.  No respiratory difficulty HEENT: normal Neck: supple. no JVD. Carotids 2+ bilat; no bruits. No lymphadenopathy or thyromegaly appreciated. Cor: PMI nondisplaced. irregularly irregular rhythm, regular rate. No rubs, gallops or murmurs. Lungs: clear Abdomen: obese, soft, nontender, nondistended. No hepatosplenomegaly. No bruits or masses. Good bowel sounds. Extremities: no cyanosis, clubbing, rash, trace bilateral LE edema w/ chronic venous stasis dermatitis  Neuro: alert & oriented x 3, cranial nerves grossly intact. moves all 4 extremities w/o difficulty. Affect pleasant.   Assessment/Plan:  1. Chronic systolic CHF: Nonischemic cardiomyopathy by cath in 2015.  Last echo in 4/20 with EF 35-40%, moderate LVH.  He has a Medtronic CRT-P device. He is minimally active but does not have much dyspnea, NYHA class II probably. Remains w/ evidence of volume overload w/ trace- 1+ bilateral LEE on exam. He reports subjective improvement w/ change to torsemide but has only been taking 20 mg daily instead of recommended 40 mg daily. Unable to monitor daily wts due to difficulties standing (see above).   - Increase torsemide to 40 mg daily. Continue supp KCl 20 daily. Repeat BMP today - Continue Coreg 25 mg bid.  - Continue Bidil 1/2 tab tid.   2. OSA: Needs to get on CPAP, has diagnosed OSA.  Will refer to Dr. Radford Pax for sleep medicine evaluation.  3. Complete heart block: MDT CRT-P device. S/p recent gen change 08/19/19 for ERI. Also found to have chronically elevated LV lead threshold. On 11/12, he underwent gen change. Dr. Rayann Heman attempted pacemaker system revision but with failed LV lead placement and left bundle pacing attempts. Unable to revise. Had gen change out only.  4. Atrial fibrillation: Permanent. Rate is controlled. He is on warfarin. INRs followed at [facility] 5. CKD Stage 3: BMET today.  6. Ascending aortic aneurysm: Per Dr. Claris Gladden recommendations, will arrange noncontrast CT of chest to further evaluate.  7. Left UE Swelling: present since undergoing device gen change. Device pocket examined. Well healed and no eryrthema or drainage around incision. Denies fever and chills. On chronic coumadin but still concern for DVT. Will check UE Venous Doppler for r/o.   F/u in 4 weeks    Brittainy Rosita Fire PA-C 09/13/2019

## 2019-09-14 DIAGNOSIS — M109 Gout, unspecified: Secondary | ICD-10-CM | POA: Diagnosis not present

## 2019-09-14 DIAGNOSIS — I5042 Chronic combined systolic (congestive) and diastolic (congestive) heart failure: Secondary | ICD-10-CM | POA: Diagnosis not present

## 2019-09-14 DIAGNOSIS — E1122 Type 2 diabetes mellitus with diabetic chronic kidney disease: Secondary | ICD-10-CM | POA: Diagnosis not present

## 2019-09-14 DIAGNOSIS — J9611 Chronic respiratory failure with hypoxia: Secondary | ICD-10-CM | POA: Diagnosis not present

## 2019-09-14 DIAGNOSIS — I4892 Unspecified atrial flutter: Secondary | ICD-10-CM | POA: Diagnosis not present

## 2019-09-14 DIAGNOSIS — I130 Hypertensive heart and chronic kidney disease with heart failure and stage 1 through stage 4 chronic kidney disease, or unspecified chronic kidney disease: Secondary | ICD-10-CM | POA: Diagnosis not present

## 2019-09-14 DIAGNOSIS — N184 Chronic kidney disease, stage 4 (severe): Secondary | ICD-10-CM | POA: Diagnosis not present

## 2019-09-14 DIAGNOSIS — I4891 Unspecified atrial fibrillation: Secondary | ICD-10-CM | POA: Diagnosis not present

## 2019-09-14 DIAGNOSIS — E1140 Type 2 diabetes mellitus with diabetic neuropathy, unspecified: Secondary | ICD-10-CM | POA: Diagnosis not present

## 2019-09-16 DIAGNOSIS — E1122 Type 2 diabetes mellitus with diabetic chronic kidney disease: Secondary | ICD-10-CM | POA: Diagnosis not present

## 2019-09-16 DIAGNOSIS — I5042 Chronic combined systolic (congestive) and diastolic (congestive) heart failure: Secondary | ICD-10-CM | POA: Diagnosis not present

## 2019-09-16 DIAGNOSIS — M109 Gout, unspecified: Secondary | ICD-10-CM | POA: Diagnosis not present

## 2019-09-16 DIAGNOSIS — N184 Chronic kidney disease, stage 4 (severe): Secondary | ICD-10-CM | POA: Diagnosis not present

## 2019-09-16 DIAGNOSIS — I4892 Unspecified atrial flutter: Secondary | ICD-10-CM | POA: Diagnosis not present

## 2019-09-16 DIAGNOSIS — I4891 Unspecified atrial fibrillation: Secondary | ICD-10-CM | POA: Diagnosis not present

## 2019-09-16 DIAGNOSIS — I130 Hypertensive heart and chronic kidney disease with heart failure and stage 1 through stage 4 chronic kidney disease, or unspecified chronic kidney disease: Secondary | ICD-10-CM | POA: Diagnosis not present

## 2019-09-16 DIAGNOSIS — E1140 Type 2 diabetes mellitus with diabetic neuropathy, unspecified: Secondary | ICD-10-CM | POA: Diagnosis not present

## 2019-09-16 DIAGNOSIS — J9611 Chronic respiratory failure with hypoxia: Secondary | ICD-10-CM | POA: Diagnosis not present

## 2019-09-17 LAB — CUP PACEART INCLINIC DEVICE CHECK
Battery Remaining Longevity: 48 mo
Brady Statistic RA Percent Paced: 1 %
Brady Statistic RV Percent Paced: 60.2 %
Date Time Interrogation Session: 20201124113524
Implantable Lead Implant Date: 20080911
Implantable Lead Implant Date: 20080911
Implantable Lead Implant Date: 20080911
Implantable Lead Location: 753858
Implantable Lead Location: 753859
Implantable Lead Location: 753860
Implantable Lead Model: 4194
Implantable Lead Model: 5076
Implantable Lead Model: 5076
Implantable Pulse Generator Implant Date: 20150724
Lead Channel Setting Pacing Amplitude: 2 V
Lead Channel Setting Pacing Amplitude: 6 V
Lead Channel Setting Pacing Pulse Width: 0.4 ms
Lead Channel Setting Pacing Pulse Width: 1.5 ms
Lead Channel Setting Sensing Sensitivity: 0.9 mV

## 2019-09-17 NOTE — Progress Notes (Signed)
CRT-P wound check and device check in clinic. Steri -strips removed, wound edges well approximated , no drainage, redness or edema. Normal device function. Thresholds, sensing, impedance consistent with previous measurements. Histograms appropriate for patient and level of activity. AT/Af burden 100%,19 AT/AF episodes with ongoing AF and known hx, + Coumadin  No ventricular high rate episodes. Patient bi-ventricularly pacing 100% of the time. Device programmed with appropriate safety margins. Device heart failure diagnostics are within normal limits and stable over time. Estimated longevity 4.4 yrs. Patient enrolled in remote follow-up and next remote follow-up 09/09/19 . F/U with Dr Rayann Heman 11/25/19.

## 2019-09-19 DIAGNOSIS — E11621 Type 2 diabetes mellitus with foot ulcer: Secondary | ICD-10-CM | POA: Diagnosis not present

## 2019-09-19 DIAGNOSIS — I5020 Unspecified systolic (congestive) heart failure: Secondary | ICD-10-CM | POA: Diagnosis not present

## 2019-09-19 DIAGNOSIS — N184 Chronic kidney disease, stage 4 (severe): Secondary | ICD-10-CM | POA: Diagnosis not present

## 2019-09-21 ENCOUNTER — Encounter: Payer: Self-pay | Admitting: Cardiology

## 2019-09-21 ENCOUNTER — Other Ambulatory Visit: Payer: Self-pay

## 2019-09-21 ENCOUNTER — Telehealth (INDEPENDENT_AMBULATORY_CARE_PROVIDER_SITE_OTHER): Payer: Medicare HMO | Admitting: Cardiology

## 2019-09-21 ENCOUNTER — Telehealth: Payer: Self-pay | Admitting: *Deleted

## 2019-09-21 ENCOUNTER — Telehealth (HOSPITAL_COMMUNITY): Payer: Self-pay | Admitting: *Deleted

## 2019-09-21 VITALS — Ht 71.0 in | Wt 287.0 lb

## 2019-09-21 DIAGNOSIS — I10 Essential (primary) hypertension: Secondary | ICD-10-CM

## 2019-09-21 DIAGNOSIS — I130 Hypertensive heart and chronic kidney disease with heart failure and stage 1 through stage 4 chronic kidney disease, or unspecified chronic kidney disease: Secondary | ICD-10-CM | POA: Diagnosis not present

## 2019-09-21 DIAGNOSIS — G4733 Obstructive sleep apnea (adult) (pediatric): Secondary | ICD-10-CM

## 2019-09-21 DIAGNOSIS — I5042 Chronic combined systolic (congestive) and diastolic (congestive) heart failure: Secondary | ICD-10-CM | POA: Diagnosis not present

## 2019-09-21 DIAGNOSIS — E1122 Type 2 diabetes mellitus with diabetic chronic kidney disease: Secondary | ICD-10-CM | POA: Diagnosis not present

## 2019-09-21 DIAGNOSIS — I4891 Unspecified atrial fibrillation: Secondary | ICD-10-CM | POA: Diagnosis not present

## 2019-09-21 DIAGNOSIS — N184 Chronic kidney disease, stage 4 (severe): Secondary | ICD-10-CM | POA: Diagnosis not present

## 2019-09-21 DIAGNOSIS — E1140 Type 2 diabetes mellitus with diabetic neuropathy, unspecified: Secondary | ICD-10-CM | POA: Diagnosis not present

## 2019-09-21 DIAGNOSIS — M109 Gout, unspecified: Secondary | ICD-10-CM | POA: Diagnosis not present

## 2019-09-21 DIAGNOSIS — I4892 Unspecified atrial flutter: Secondary | ICD-10-CM | POA: Diagnosis not present

## 2019-09-21 DIAGNOSIS — J9611 Chronic respiratory failure with hypoxia: Secondary | ICD-10-CM | POA: Diagnosis not present

## 2019-09-21 NOTE — Progress Notes (Signed)
Virtual Visit via Telephone Note   This visit type was conducted due to national recommendations for restrictions regarding the COVID-19 Pandemic (e.g. social distancing) in an effort to limit this patient's exposure and mitigate transmission in our community.  Due to his co-morbid illnesses, this patient is at least at moderate risk for complications without adequate follow up.  This format is felt to be most appropriate for this patient at this time.  All issues noted in this document were discussed and addressed.  A limited physical exam was performed with this format.  Please refer to the patient's chart for his consent to telehealth for [facility].   Evaluation Performed:  Follow-up visit  This visit type was conducted due to national recommendations for restrictions regarding the COVID-19 Pandemic (e.g. social distancing).  This format is felt to be most appropriate for this patient at this time.  All issues noted in this document were discussed and addressed.  No physical exam was performed (except for noted visual exam findings with Video Visits).  Please refer to the patient's chart (MyChart message for video visits and phone note for telephone visits) for the patient's consent to telehealth for [facility].  Date:  09/21/2019   ID:  Kurt Cross, DOB March 11, 1951, MRN [id number]  Patient Location:  Home  Provider location:   [place]  PCP:  [name], MD  Cardiologist: Loralie Champagne, MD Sleep Medicine:  Fransico Him, MD Electrophysiologist:  Cristopher Peru, MD   Chief Complaint:  OSA  History of Present Illness:    Kurt Cross is a 68 y.o. male who presents via audio/video conferencing for a telehealth visit today.    This is a 68yo male with a hx of chronic systolic CHF secondary to NIDM EF 35-40%, CHB s/p MDT CRT-P device, permanent atrial fibrillation and OSA.  He carried a dx of OSA but has not been on PAP therapy and is now referred for sleep  evaluation.    He tells me that he he had a sleep study done in 2018 but it is not in the system.  He has never been on CPAP.  He tells me that he feels rested in the am and has no daytime sleepiness.  He does not snore that he knows about and never wakes up gasping for breath.  He has no morning headaches.    The patient does not have symptoms concerning for COVID-19 infection (fever, chills, cough, or new shortness of breath).    Prior CV studies:   The following studies were reviewed today:  none  Past Medical History:  Diagnosis Date  . AAA (abdominal aortic aneurysm)/ 4.5 cm ascending per CT angio 08/01/13 08/12/2013  . Abscess 02/13/2018  . Acute on chronic renal failure (HCC) 01/27/2016   02/15/16 Na 130, K 4.6, Bun 47, creat 2.07    . Acute on chronic respiratory failure ([place]) 01/27/2016  . Acute on chronic respiratory failure with hypoxia ([place]) 01/27/2016   02/14/16 CXR no acute pulmonary abnormality, a nodule density noted at the right upper lobe, could represent a granuloma versus neoplastic lesion, may CT   . Acute renal failure (ARF) ([place]) 08/15/2013  . AKI (acute kidney injury) ([place]) 11/12/2013  . Atrial fibrillation ([place])   . Atrial flutter ([place]) 02/22/2016  . Benign hypertension   . Bradycardia   . BRONCHITIS, CHRONIC 09/01/2007   Qualifier: Diagnosis of  By: Doy Mince LPN, Megan    . Cellulitis of left lower extremity 02/14/2018  .  CHF (congestive heart failure), NYHA class II ([place])   . Chronic anticoagulation 02/22/2016  . Chronic combined systolic and diastolic CHF (congestive heart failure) ([place]) 08/04/2013   02/14/16 CXR no acute pulmonary abnormality, a nodule density noted at the right upper lobe, could represent a granuloma versus neoplastic lesion, may CT   . Chronic respiratory failure ([place]) 02/10/2016  . Chronic ulcer of left heel ([place]) 02/17/2018  . CKD (chronic kidney disease), stage III 02/17/2018  . Complete heart block (HCC)    s/p PPM Bi-Ven  . Constipation,  chronic 08/03/2013  . Debility 02/10/2016  . Decubitus ulcer of sacral region, stage 3 ([place]) 02/14/2018  . Diabetes mellitus, type 2 ([place])   . H/O medication noncompliance 02/22/2016  . Hernia of abdominal cavity   . Hyperkalemia 11/12/2013  . Hyperlipidemia   . Hypertension   . Hypokalemia   . Hypoxemia   . Hypoxia 01/26/2016  . 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 Kidney failure   . Neuropathy   . Obesity, Class III, BMI 40-49.9 (morbid obesity) ([place]) 09/01/2007   Qualifier: Diagnosis of  By: Doy Mince LPN, Megan    . Obstructive sleep apnea 09/01/2007   Qualifier: Diagnosis of  By: Doy Mince LPN, Megan    . OSA (obstructive sleep apnea)   . Recurrent ventral incisional hernia 08/14/2013  . Right leg pain 08/11/2013  . S/P placement of cardiac pacemaker 02/22/2016  . Sepsis ([place]) 08/02/2013  . Short gut syndrome due to distal jejunal SB fistula 10/22/2013   Past Surgical History:  Procedure Laterality Date  . APPENDECTOMY    . biventricular pacemaker generator change  04/29/2014   MDT Viva CRT-P BiV pacemaker implanted by Dr Rolla Etienne in [place]  . biventricular pacemaker implantation  06/18/2007   MDT BIV pacemaker implanted by Dr Lovena Le  . BOWEL RESECTION  1980s?   ?colectomy/ostomy - Dr Elesa Hacker  . INCISION AND DRAINAGE ABSCESS N/A 08/14/2013   Procedure: INCISION AND DRAINAGE ABSCESS, mesh ;  Surgeon: [name], MD;  Location: [facility] ORS;  Service: General;  Laterality: N/A;  . [place]?   Dr. Lindon Romp - w mesh  . KNEE SURGERY Right   . LEAD REVISION/REPAIR N/A 08/19/2019   Procedure: LEAD REVISION/REPAIR;  Surgeon: [name], MD;  Location: [place] CV LAB;  Service: Cardiovascular;  Laterality: N/A;  . LEFT HEART CATHETERIZATION WITH CORONARY ANGIOGRAM N/A 03/17/2014   Procedure: LEFT HEART CATHETERIZATION WITH CORONARY ANGIOGRAM;  Surgeon:  [name], MD;  Location: [place] CATH LAB;  Service: Cardiovascular;  Laterality: N/A;  . NASAL SEPTUM SURGERY    . OSTOMY TAKE DOWN  1980s?   Dr Elesa Hacker  . PPM GENERATOR CHANGEOUT N/A 08/19/2019   Procedure: PPM GENERATOR CHANGEOUT;  Surgeon: [name], MD;  Location: [place] CV LAB;  Service: Cardiovascular;  Laterality: N/A;  . TONSILLECTOMY AND ADENOIDECTOMY       Current Meds  Medication Sig  . Accu-Chek Softclix Lancets lancets Use to test blood sugars 1-2 times daily.  . Blood Glucose Monitoring Suppl (ACCU-CHEK AVIVA) device Use to test blood sugars 1-2 times daily.  . colchicine 0.6 MG tablet Take 1 tablet (0.6 mg total) by mouth daily.  [name] Kitchen gabapentin (NEURONTIN) 300 MG capsule Take 1 capsule (300 mg total) by mouth 3 (three) times daily.  [name] Kitchen glucose blood (ACCU-CHEK AVIVA PLUS) test strip Use to test  blood sugars 1-2 times daily.  . insulin NPH Human (NOVOLIN N) 100 UNIT/ML injection Inject 4-8 Units into the skin See admin instructions. If blood glucose is over 120, inject 7-8 units. If blood glucose is below 120, inject 4-5 units.  . insulin regular (NOVOLIN R) 100 units/mL injection Use with sliding scale.  . isosorbide-hydrALAZINE (BIDIL) 20-37.5 MG tablet Take 0.5 tablets by mouth 3 (three) times daily.  . potassium chloride SA (KLOR-CON) 20 MEQ tablet Take 3 tablets (60 mEq total) by mouth 2 (two) times daily. Please cancel all previous orders for current medication. Change in dosage or pill size.  . rosuvastatin (CRESTOR) 10 MG tablet Take 1 tablet (10 mg total) by mouth daily.  [name] Kitchen torsemide (DEMADEX) 20 MG tablet Take 2 tablets (40 mg total) by mouth daily.  . traZODone (DESYREL) 50 MG tablet Take 2 tablets (100 mg total) by mouth at bedtime as needed for sleep.  [name] Kitchen warfarin (COUMADIN) 10 MG tablet Take 1 tablet (10 mg total) by mouth daily. Take one tablet (10mg ) every day EXCEPT Saturday, take 1/2 tablet (5mg ), continue as directed by the coumadin clinic      Allergies:   Patient has no known allergies.   Social History   Tobacco Use  . Smoking status: Former Smoker    Packs/day: 1.00    Years: 40.00    Pack years: 40.00    Types: Cigarettes    Quit date: 08/21/2012    Years since quitting: 7.0  . Smokeless tobacco: Never Used  Substance Use Topics  . Alcohol use: Not Currently    Alcohol/week: 0.0 standard drinks    Comment: occasional  . Drug use: No     Family Hx: The patient's family history includes Alcohol abuse in his mother; Alcoholism in his father; Diabetes in his mother; Heart Problems in his maternal grandmother.  ROS:   Please see the history of present illness.     All other systems reviewed and are negative.   Labs/Other Tests and Data Reviewed:    Recent Labs: 02/03/2019: B Natriuretic Peptide 194.2; TSH 1.287 07/20/2019: ALT 10 08/19/2019: Hemoglobin 14.3; Platelets 242 09/13/2019: BUN 20; Creatinine, Ser 1.93; Magnesium 1.7; Potassium 4.6; Sodium 140   Recent Lipid Panel Lab Results  Component Value Date/Time   CHOL 111 07/20/2019 11:55 AM   TRIG 160.0 (H) 07/20/2019 11:55 AM   HDL 20.70 (L) 07/20/2019 11:55 AM   CHOLHDL 5 07/20/2019 11:55 AM   LDLCALC 58 07/20/2019 11:55 AM   LDLDIRECT 63.0 07/20/2019 11:55 AM    Wt Readings from Last 3 Encounters:  09/21/19 287 lb (130.2 kg)  08/20/19 279 lb 12.2 oz (126.9 kg)  07/10/19 278 lb 3.5 oz (126.2 kg)     Objective:    Vital Signs:  Ht 5\' 11"  (1.803 m)   Wt 287 lb (130.2 kg)   BMI 40.03 kg/m    ASSESSMENT & PLAN:    1.  OSA -patient has had a sleep study at [facility] in 2017 or 2018 but I cannot find any record of this -he denies any excessive daytime sleepiness or snoring -he is morbidly obese with CHF, HTN and afib -I have recommended an [facility] home sleep study which we will get set up  2.  HTN -Bidil 20-37.5mg  BID  3.  Morbid obesity -he is wheelchair bound and unable to exercise although he has a therapist that works with him at home   COVID-19 Education: The signs and symptoms of COVID-19 were discussed with  the patient and how to seek care for testing (follow up with PCP or arrange E-visit).  The importance of social distancing was discussed today.  Patient Risk:   After full review of this patient's clinical status, I feel that they are at least moderate risk at this time.  Time:   Today, I have spent 20 minutes directly with the patient on telemedicine discussing medical problems including OSA, HTN.  We also reviewed the symptoms of COVID 19 and the ways to protect against contracting the virus with telehealth technology.  I spent an additional 5 minutes reviewing patient's chart including office noets.  Medication Adjustments/Labs and Tests Ordered: Current medicines are reviewed at length with the patient today.  Concerns regarding medicines are outlined above.  Tests Ordered: No orders of the defined types were placed in this encounter.  Medication Changes: No orders of the defined types were placed in this encounter.   Disposition:  Follow up prn  Signed, Fransico Him, MD  09/21/2019 1:59 PM    [place]

## 2019-09-21 NOTE — Addendum Note (Signed)
Addended by: Freada Bergeron on: 09/21/2019 02:40 PM   Modules accepted: Orders

## 2019-09-21 NOTE — Telephone Encounter (Signed)
  Kurt Margarita, MD  Freada Bergeron, CMA  Please order an [facility] home sleep study for OSA   Sleep study ordered.

## 2019-09-21 NOTE — Telephone Encounter (Signed)
Increase torsemide to 40 mg bid x 3 days then 40 qam/20 qpm. BMET 10 days.

## 2019-09-21 NOTE — Telephone Encounter (Signed)
Patient advised and verbalized understanding,lab appt scheduled,mad list updated to reflect changes.

## 2019-09-21 NOTE — Telephone Encounter (Signed)
PER Dr Radford Pax: I have recommended an [facility] home sleep study which we will get set up.  Patient is aware and agreeable to Home Sleep Study through [facility]. Patient is aware that the [facility] Rep will be calling him from a [phone] number to get him set up with his sleep study. Patient is agreeable to treatment and thankful for call.

## 2019-09-21 NOTE — Telephone Encounter (Signed)
Kurt Cross w/[facility] called to report more swelling in patients feet, more shortness of breath, and more fatigue. Patient can't weigh because he cant stand. She wants to know if we can increase his diuretic.   Routed to [place] for advice

## 2019-09-21 NOTE — Telephone Encounter (Signed)
Patient Name: Kurt Cross        DOB: 1951/08/29      Height: 5'11"    Weight: 287 lb  Office Name: [facility]         Referring Provider: Fransico Him  Today's Date: 09/21/19  Date:   STOP BANG RISK ASSESSMENT S (snore) Have you been told that you snore?     NO   T (tired) Are you often tired, fatigued, or sleepy during the day?   NO  O (obstruction) Do you stop breathing, choke, or gasp during sleep? NO   P (pressure) Do you have or are you being treated for high blood pressure? YES   B (BMI) Is your body index greater than 35 kg/m? YES   A (age) Are you 66 years old or older? YES   N (neck) Do you have a neck circumference greater than 16 inches?   YES   G (gender) Are you a male? YES   TOTAL STOP/BANG "YES" ANSWERS                                                                        For Office Use Only              Procedure Order Form    YES to 3+ Stop Bang questions OR two clinical symptoms - patient qualifies for WatchPAT (CPT 95800)     Submit: This Form + Patient Face Sheet + Clinical Note via CloudPAT or Fax: [phone]         Clinical Notes: Will consult Sleep Specialist and refer for management of therapy due to patient increased risk of Sleep Apnea. Ordering a sleep study due to the following two clinical symptoms: Excessive daytime sleepiness G47.10 / Gastroesophageal reflux K21.9 / Nocturia R35.1 / Morning Headaches G44.221 / Difficulty concentrating R41.840 / Memory problems or poor judgment G31.84 / Personality changes or irritability R45.4 / Loud snoring R06.83 / Depression F32.9 / Unrefreshed by sleep G47.8 / Impotence N52.9 / History of high blood pressure R03.0 / Insomnia G47.00    I understand that I am proceeding with a home sleep apnea test as ordered by my treating physician. I understand that untreated sleep apnea is a serious cardiovascular risk factor and it is my responsibility to perform the test and seek management for sleep apnea. I  will be contacted with the results and be managed for sleep apnea by a local sleep physician. I will be receiving equipment and further instructions from [facility]. I shall promptly ship back the equipment via the included mailing label. I understand my insurance will be billed for the test and as the patient I am responsible for any insurance related out-of-pocket costs incurred. I have been provided with written instructions and can call for additional video or telephonic instruction, with 24-hour availability of qualified personnel to answer any questions: Patient Help Desk [phone].  Patient Signature  [name] ______________________________________________________   Date______12/15/20________________ Patient Telemedicine Verbal Consent

## 2019-09-21 NOTE — Telephone Encounter (Signed)

## 2019-09-22 MED ORDER — TORSEMIDE 20 MG PO TABS: ORAL_TABLET | ORAL | 6 refills | Status: AC

## 2019-09-23 ENCOUNTER — Other Ambulatory Visit: Payer: Self-pay

## 2019-09-23 NOTE — Patient Outreach (Signed)
[facility]) Care Management  [place]  09/23/2019   Kurt Cross [birth year] [id number]  Subjective: Telephone call to patient.  He reports he is doing ok. He reports he still does not have wheelchair and that is frustrating. Advised patient that last conversation with his wife he would not get a chair fir 2 months.  He verbalized understanding.  Patient reports his last INR check was good but that is also frustrating having to go and that he has to use SCAT and the process of using SCAT is frustrating having to wait.  Advised patient to talk to physician about some other method for coagulation.  He states he will.  Patient report he did have some increase swelling and shortness of breath and his fluid pill was increased.  He states he has had no increase in the shortness of breath or swelling from previous report to MD.  He is not able to weigh as he cannot stand.  Discussed signs of worsening heart failure and notifying physician for ant further problems. He verbalized understanding and voices no further needs.   Objective:   Encounter Medications:  Outpatient Encounter Medications as of 09/23/2019  Medication Sig  . Accu-Chek Softclix Lancets lancets Use to test blood sugars 1-2 times daily.  . Blood Glucose Monitoring Suppl (ACCU-CHEK AVIVA) device Use to test blood sugars 1-2 times daily.  . colchicine 0.6 MG tablet Take 1 tablet (0.6 mg total) by mouth daily.  [name] Kitchen gabapentin (NEURONTIN) 300 MG capsule Take 1 capsule (300 mg total) by mouth 3 (three) times daily.  [name] Kitchen glucose blood (ACCU-CHEK AVIVA PLUS) test strip Use to test blood sugars 1-2 times daily.  . insulin NPH Human (NOVOLIN N) 100 UNIT/ML injection Inject 4-8 Units into the skin See admin instructions. If blood glucose is over 120, inject 7-8 units. If blood glucose is below 120, inject 4-5 units.  . insulin regular (NOVOLIN R) 100 units/mL injection Use with sliding scale.  . isosorbide-hydrALAZINE  (BIDIL) 20-37.5 MG tablet Take 0.5 tablets by mouth 3 (three) times daily.  . potassium chloride SA (KLOR-CON) 20 MEQ tablet Take 3 tablets (60 mEq total) by mouth 2 (two) times daily. Please cancel all previous orders for current medication. Change in dosage or pill size.  . rosuvastatin (CRESTOR) 10 MG tablet Take 1 tablet (10 mg total) by mouth daily.  [name] Kitchen torsemide (DEMADEX) 20 MG tablet Take 2 tablets by mouth every morning and 1 tablet every evening  . traZODone (DESYREL) 50 MG tablet Take 2 tablets (100 mg total) by mouth at bedtime as needed for sleep.  [name] Kitchen warfarin (COUMADIN) 10 MG tablet Take 1 tablet (10 mg total) by mouth daily. Take one tablet ('10mg'$ ) every day EXCEPT Saturday, take 1/2 tablet ('5mg'$ ), continue as directed by the coumadin clinic   No facility-administered encounter medications on file as of 09/23/2019.    Functional Status:  In your present state of health, do you have any difficulty performing the following activities: 07/19/2019 07/13/2019  Hearing? N -  Vision? N -  Difficulty concentrating or making decisions? N -  Walking or climbing stairs? Y -  Comment patietn immobile presently -  Dressing or bathing? N -  Doing errands, shopping? Y Y  Comment patient needs to be transported. -  Preparing Food and eating ? Y -  Comment patient immobile -  Using the Toilet? Y -  Comment patient immobile -  In the past six months, have you accidently leaked urine?  N -  Do you have problems with loss of bowel control? N -  Managing your Medications? Y -  Comment wife managing medications -  Managing your Finances? Y -  Comment wife assists -  Housekeeping or managing your Housekeeping? Y -  Comment wife assists -  Some recent data might be hidden    Fall/Depression Screening: Fall Risk  07/19/2019 03/16/2019 11/05/2016  Falls in the past year? 1 1 Yes  Comment - patient states he slipped out of wheelchair approximately 3 months ago  last fall date:  05/2016   Number falls  in past yr: 0 0 1  Comment - - -  Injury with Fall? - 0 No  Risk Factor Category  - - -  Risk for fall due to : History of fall(s) History of fall(s);Impaired mobility Impaired mobility  Follow up Falls prevention discussed Falls prevention discussed Falls prevention discussed   PHQ 2/9 Scores 07/19/2019 06/28/2019 03/16/2019 11/16/2018 09/13/2016 09/04/2016  PHQ - 2 Score 0 0 0 0 0 0    Assessment: Patient continues with managing chronic diseases and is high risk for readmission.    Plan:  [facility] CM Care Plan Problem One     Most Recent Value  Care Plan Problem One  Support Needs for Self- Health Management CHF  Role Documenting the Problem One  Care Management Telephonic Coordinator  Care Plan for Problem One  Active  THN Long Term Goal   Over the next 31 days, patient will demonstrate and/or verbalize understanding of self-health management for long term care of CHF.   THN Long Term Goal Start Date  09/23/19  Interventions for Problem One Long Term Goal  RN CM reviewed with patient ways of monitoring heart failure, signs & symptoms, and when to notify physician.  THN CM Short Term Goal #1   Patient/caregiver will be able to name signs of heart failure within 29 days.    THN CM Short Term Goal #1 Start Date  08/17/19  THN CM Short Term Goal #1 Met Date  09/23/19  Interventions for Short Term Goal #1  Patient able to name signs of heart failure.      RN CM will contact patient next month and patient agreeable.    [name], RN, MSN [place] Management Care Management Coordinator Direct Line [phone] Cell [phone] Toll Free: [phone]  Fax: [phone]

## 2019-09-27 NOTE — Telephone Encounter (Signed)
Fax came today stating [facility] is not contracted with the patient's insurance. Please write order for DME

## 2019-09-27 NOTE — Telephone Encounter (Signed)
Is this for a home sleep study?

## 2019-09-27 NOTE — Telephone Encounter (Signed)
Yes it was for [facility] study. They don't take his insurance.

## 2019-09-28 ENCOUNTER — Telehealth (HOSPITAL_COMMUNITY): Payer: Self-pay

## 2019-09-28 NOTE — Telephone Encounter (Signed)
Increase torsemide to 80 mg daily for 2 days then back 40 mg daily.   Kurt Cross NP_C  4:37 PM

## 2019-09-28 NOTE — Telephone Encounter (Signed)
Received call from [facility] stating that patient is reporting swelling in left arm/hand and left leg.  I called patient, he reports swelling has been unchanged in left arm/hand since leaving hospital.  He denies numbness, tingling or discoloration. He maintains that his arm/hand is warm.  His feet remains swollen which is unchanged since call last week.  He said he increased torsemide as prescribed however swelling did not go down.  He denies SOB or chest pain.  He notes that he is currently quarantined because his wife tested positive for covid so [name] could not go into home. Please advise

## 2019-09-29 ENCOUNTER — Telehealth: Payer: Self-pay | Admitting: Family Medicine

## 2019-09-29 NOTE — Telephone Encounter (Signed)
Attempted to reach patient, no answer, unable to leave message. Cell phone not in service. Will make another attempt later this morning

## 2019-09-29 NOTE — Telephone Encounter (Signed)
Elmyra Ricks from [facility] home health, regarding patient wife tested positive for covid. Elmyra Ricks is placing his visits on hold until wife test negative. Call back [phone]

## 2019-09-29 NOTE — Telephone Encounter (Signed)
Called patient again, wife answered. Advised to increase torsemide to 80mg  x2 days then back to 40mg  daily.  Verbalized understanding.  Also advised to reach out to surgeons office to evaluate left hand/arm swelling post surgery. Advised to call us if any worsening swelling or additional symptoms.  Verbalized understanding.

## 2019-10-04 ENCOUNTER — Ambulatory Visit (HOSPITAL_COMMUNITY)
Admission: RE | Admit: 2019-10-04 | Discharge: 2019-10-04 | Disposition: A | Discharge status: Home / Self Care | Payer: Medicare HMO | Source: Ambulatory Visit | Attending: Cardiology | Admitting: Cardiology

## 2019-10-04 ENCOUNTER — Ambulatory Visit (HOSPITAL_COMMUNITY): Admission: RE | Admit: 2019-10-04 | Payer: Medicare HMO | Source: Ambulatory Visit

## 2019-10-04 ENCOUNTER — Other Ambulatory Visit: Payer: Self-pay

## 2019-10-04 DIAGNOSIS — I5042 Chronic combined systolic (congestive) and diastolic (congestive) heart failure: Secondary | ICD-10-CM | POA: Insufficient documentation

## 2019-10-04 LAB — BASIC METABOLIC PANEL
Anion gap: 9 (ref 5–15)
BUN: 23 mg/dL (ref 8–23)
CO2: 27 mmol/L (ref 22–32)
Calcium: 8.5 mg/dL — ABNORMAL LOW (ref 8.9–10.3)
Chloride: 102 mmol/L (ref 98–111)
Creatinine, Ser: 2.18 mg/dL — ABNORMAL HIGH (ref 0.61–1.24)
GFR calc Af Amer: 35 mL/min — ABNORMAL LOW (ref >60)
GFR calc non Af Amer: 30 mL/min — ABNORMAL LOW (ref >60)
Glucose, Bld: 129 mg/dL — ABNORMAL HIGH (ref 70–99)
Potassium: 4 mmol/L (ref 3.5–5.1)
Sodium: 138 mmol/L (ref 135–145)

## 2019-10-05 DIAGNOSIS — E1122 Type 2 diabetes mellitus with diabetic chronic kidney disease: Secondary | ICD-10-CM | POA: Diagnosis not present

## 2019-10-05 DIAGNOSIS — M109 Gout, unspecified: Secondary | ICD-10-CM | POA: Diagnosis not present

## 2019-10-05 DIAGNOSIS — J9611 Chronic respiratory failure with hypoxia: Secondary | ICD-10-CM | POA: Diagnosis not present

## 2019-10-05 DIAGNOSIS — I130 Hypertensive heart and chronic kidney disease with heart failure and stage 1 through stage 4 chronic kidney disease, or unspecified chronic kidney disease: Secondary | ICD-10-CM | POA: Diagnosis not present

## 2019-10-05 DIAGNOSIS — I4891 Unspecified atrial fibrillation: Secondary | ICD-10-CM | POA: Diagnosis not present

## 2019-10-05 DIAGNOSIS — I4892 Unspecified atrial flutter: Secondary | ICD-10-CM | POA: Diagnosis not present

## 2019-10-05 DIAGNOSIS — I5042 Chronic combined systolic (congestive) and diastolic (congestive) heart failure: Secondary | ICD-10-CM | POA: Diagnosis not present

## 2019-10-05 DIAGNOSIS — E1140 Type 2 diabetes mellitus with diabetic neuropathy, unspecified: Secondary | ICD-10-CM | POA: Diagnosis not present

## 2019-10-05 DIAGNOSIS — N184 Chronic kidney disease, stage 4 (severe): Secondary | ICD-10-CM | POA: Diagnosis not present

## 2019-10-05 NOTE — Progress Notes (Signed)
PPM Remote  

## 2019-10-11 ENCOUNTER — Telehealth (HOSPITAL_COMMUNITY): Payer: Self-pay | Admitting: Cardiology

## 2019-10-11 ENCOUNTER — Other Ambulatory Visit: Payer: Self-pay

## 2019-10-11 NOTE — Telephone Encounter (Signed)
COVID-19 pre-appointment screening questions:   Do you have a history of COVID-19 or a positive test result in the past 7-10 days?  To the best of your knowledge, have you been in close contact with anyone with a confirmed diagnosis of COVID 19?  Have you had any one or more of the following: Fever, chills, cough, shortness of breath (out of the normal for you) or any flu-like symptoms?  Are you experiencing any of the following symptoms that is new or out of usual for you:  . Ear, nose or throat discomfort . Sore throat . Headache . Muscle Pain . Diarrhea . Loss of taste or smell   Reviewed all the following with patient: . Use of hand sanitizer when entering the building . Everyone is required to wear a mask in the building, if you do not have a mask we are happy to provide you with one when you arrive . NO Visitor guidelines   If patient answers YES to any of questions they must change to a virtual visit and place note in comments about symptoms   LEFT MESSAGE FOR PATIENT

## 2019-10-12 ENCOUNTER — Encounter: Payer: Self-pay | Admitting: Family Medicine

## 2019-10-12 ENCOUNTER — Ambulatory Visit (HOSPITAL_COMMUNITY)
Admission: RE | Admit: 2019-10-12 | Discharge: 2019-10-12 | Disposition: A | Discharge status: Home / Self Care | Payer: Medicare Other | Source: Ambulatory Visit | Attending: Cardiology | Admitting: Cardiology

## 2019-10-12 ENCOUNTER — Ambulatory Visit (INDEPENDENT_AMBULATORY_CARE_PROVIDER_SITE_OTHER): Payer: Medicare Other | Admitting: Family Medicine

## 2019-10-12 ENCOUNTER — Ambulatory Visit: Payer: Medicare HMO | Admitting: Family Medicine

## 2019-10-12 ENCOUNTER — Encounter (HOSPITAL_COMMUNITY): Payer: Self-pay

## 2019-10-12 VITALS — BP 138/78 | HR 61 | Temp 98.2°F | Ht 71.0 in

## 2019-10-12 VITALS — BP 153/83 | HR 100

## 2019-10-12 DIAGNOSIS — Z833 Family history of diabetes mellitus: Secondary | ICD-10-CM | POA: Diagnosis not present

## 2019-10-12 DIAGNOSIS — M109 Gout, unspecified: Secondary | ICD-10-CM | POA: Diagnosis not present

## 2019-10-12 DIAGNOSIS — E66813 Obesity, class 3: Secondary | ICD-10-CM

## 2019-10-12 DIAGNOSIS — Z87891 Personal history of nicotine dependence: Secondary | ICD-10-CM | POA: Insufficient documentation

## 2019-10-12 DIAGNOSIS — E782 Mixed hyperlipidemia: Secondary | ICD-10-CM

## 2019-10-12 DIAGNOSIS — N183 Chronic kidney disease, stage 3 unspecified: Secondary | ICD-10-CM | POA: Diagnosis not present

## 2019-10-12 DIAGNOSIS — Z23 Encounter for immunization: Secondary | ICD-10-CM | POA: Diagnosis not present

## 2019-10-12 DIAGNOSIS — Z79899 Other long term (current) drug therapy: Secondary | ICD-10-CM | POA: Diagnosis not present

## 2019-10-12 DIAGNOSIS — E1122 Type 2 diabetes mellitus with diabetic chronic kidney disease: Secondary | ICD-10-CM | POA: Diagnosis not present

## 2019-10-12 DIAGNOSIS — G4733 Obstructive sleep apnea (adult) (pediatric): Secondary | ICD-10-CM | POA: Insufficient documentation

## 2019-10-12 DIAGNOSIS — I712 Thoracic aortic aneurysm, without rupture: Secondary | ICD-10-CM | POA: Insufficient documentation

## 2019-10-12 DIAGNOSIS — Z993 Dependence on wheelchair: Secondary | ICD-10-CM | POA: Insufficient documentation

## 2019-10-12 DIAGNOSIS — Z7901 Long term (current) use of anticoagulants: Secondary | ICD-10-CM | POA: Diagnosis not present

## 2019-10-12 DIAGNOSIS — R5381 Other malaise: Secondary | ICD-10-CM | POA: Diagnosis not present

## 2019-10-12 DIAGNOSIS — I442 Atrioventricular block, complete: Secondary | ICD-10-CM | POA: Diagnosis not present

## 2019-10-12 DIAGNOSIS — Z794 Long term (current) use of insulin: Secondary | ICD-10-CM | POA: Diagnosis not present

## 2019-10-12 DIAGNOSIS — R262 Difficulty in walking, not elsewhere classified: Secondary | ICD-10-CM

## 2019-10-12 DIAGNOSIS — I428 Other cardiomyopathies: Secondary | ICD-10-CM | POA: Diagnosis not present

## 2019-10-12 DIAGNOSIS — I2510 Atherosclerotic heart disease of native coronary artery without angina pectoris: Secondary | ICD-10-CM | POA: Diagnosis not present

## 2019-10-12 DIAGNOSIS — Z950 Presence of cardiac pacemaker: Secondary | ICD-10-CM | POA: Insufficient documentation

## 2019-10-12 DIAGNOSIS — E669 Obesity, unspecified: Secondary | ICD-10-CM | POA: Insufficient documentation

## 2019-10-12 DIAGNOSIS — I4821 Permanent atrial fibrillation: Secondary | ICD-10-CM | POA: Insufficient documentation

## 2019-10-12 DIAGNOSIS — I5022 Chronic systolic (congestive) heart failure: Secondary | ICD-10-CM

## 2019-10-12 DIAGNOSIS — M79604 Pain in right leg: Secondary | ICD-10-CM

## 2019-10-12 DIAGNOSIS — G629 Polyneuropathy, unspecified: Secondary | ICD-10-CM

## 2019-10-12 LAB — BASIC METABOLIC PANEL
Anion gap: 10 (ref 5–15)
BUN: 17 mg/dL (ref 8–23)
CO2: 25 mmol/L (ref 22–32)
Calcium: 8.7 mg/dL — ABNORMAL LOW (ref 8.9–10.3)
Chloride: 105 mmol/L (ref 98–111)
Creatinine, Ser: 1.99 mg/dL — ABNORMAL HIGH (ref 0.61–1.24)
GFR calc Af Amer: 39 mL/min — ABNORMAL LOW (ref >60)
GFR calc non Af Amer: 34 mL/min — ABNORMAL LOW (ref >60)
Glucose, Bld: 120 mg/dL — ABNORMAL HIGH (ref 70–99)
Potassium: 4 mmol/L (ref 3.5–5.1)
Sodium: 140 mmol/L (ref 135–145)

## 2019-10-12 MED ORDER — ROSUVASTATIN CALCIUM 10 MG PO TABS: 10.0000 mg | ORAL_TABLET | Freq: Every day | ORAL | 1 refills | Status: AC

## 2019-10-12 MED ORDER — GABAPENTIN 300 MG PO CAPS: 300.0000 mg | ORAL_CAPSULE | Freq: Three times a day (TID) | ORAL | 1 refills | Status: AC

## 2019-10-12 MED ORDER — CARVEDILOL 25 MG PO TABS: 25.0000 mg | ORAL_TABLET | Freq: Two times a day (BID) | ORAL | 3 refills | Status: AC

## 2019-10-12 NOTE — Patient Instructions (Signed)
RESTART Coreg 25mg , one tab twice daily  You have been referred to [facility] Radiology for a Chest CT, we will get this appointment rescheduled  Labs today We will only contact you if something comes back abnormal or we need to make some changes. Otherwise no news is good news!  Your physician recommends that you schedule a follow-up appointment in: 4 weeks with the CHF Pharmacy Team  Your physician recommends that you schedule a follow-up appointment in: 2-3 months with Dr Aundra Dubin  Do the following things EVERYDAY: 1) Weigh yourself in the morning before breakfast. Write it down and keep it in a log. 2) Take your medicines as prescribed 3) Eat low salt foods--Limit salt (sodium) to 2000 mg per day.  4) Stay as active as you can everyday 5) Limit all fluids for the day to less than 2 liters  At the [facility], you and your health needs are our priority. As part of our continuing mission to provide you with exceptional heart care, we have created designated Provider Care Teams. These Care Teams include your primary Cardiologist (physician) and Advanced Practice Providers (APPs- Physician Assistants and Nurse Practitioners) who all work together to provide you with the care you need, when you need it.   You may see any of the following providers on your designated Care Team at your next follow up: [name] Kitchen Dr Glori Bickers . Dr Loralie Champagne . Darrick Grinder, NP . Lyda Jester, PA . Audry Riles, PharmD   Please be sure to bring in all your medications bottles to every appointment.

## 2019-10-12 NOTE — Progress Notes (Signed)
Advanced Heart Failure Clinic Note    PCP: [name], MD HF Cardiology: Dr. Aundra Dubin  69 y.o. with history of permanent atrial fibrillation, Nonischemic cardiomyopathy, and complete heart block was referred by Dr. Rayann Heman for CHF evaluation. He has a long history of nonischemic cardiomyopathy, cath in 2015 showed nonobstructive CAD.  Most recent echo in 4/20 showed EF 35-40%.  He has a Medtronic CRT-P device implanted initially for complete heart block. He has a ventral incisional hernia that has caused a lot of problems and has required multiple surgeries. He is on coumadin and INRs followed by [facility].   Patient is not very active.  Since his hernia surgeries, he has remained essentially wheelchair-bound. He is working with PT.  No dyspnea wheeling his chair or with transfers.  No chest pain, no orthopnea/PND.  He has been diagnosed with OSA in the past but is not on CPAP.  He had been on an unusual diuretic regimen with Lasix 20 mg daily and metolazone 2.5 mg daily. He was also having issues w/ persistent hypokalemia w/ metolazone. Dr. Aundra Dubin saw him on 11/4 and changed diuretic regimen. Lasix and metolazone were both discontinued. He was instructed to take torsemide 40 mg daily (only has been taking 20 mg daily).   His device also recently reached ERI. Also found to have chronically elevated LV lead threshold. On 11/12, he underwent gen change. Dr. Rayann Heman attempted pacemaker system revision but with failed LV lead placement and left bundle pacing attempts. Unable to revise. Had gen change out only.   He presents back to clinic today for f/u. At last clinic visit, he was volume overloaded and impedence was down c/w volume overload. We adjusted his diuretics and increased Torsemide to 40 mg daily.    He is feeling better. Mainly wheelchair bound and no resting dyspnea. LEE improved. Unable to stand on scale for wt today. Denies orthopnea/ PND. Reports compliance w/ most  of his meds, except coreg. He ran out several days ago and was w/o refills. He has chronic afib. Device interrogation today shows increased Ventricular rates in the 110s-120s over the last several days. Volume status improved. Impedence up and optivol fluid index down.   ECG (personally reviewed): not done today. Chronic afib w/ RVR based on device interrogation. Rates 110s-120  Labs (10/20): LDL 58, HDL 21, K 2.6, creatinine 2.45  Labs (12/20) creatinine 2.18 K 4.0   PMH: 1. Permanent atrial fibrillation.  2. Ascending aortic aneurysm: 4.5 cm on 10/19 CTA.  3. Ventral incisional hernia: Has required multiple surgeries.  4. Obesity 5. CKD: Stage 3.  6. Gout  7. Type 2 diabetes 8. Chronic systolic CHF: Nonischemic cardiomyopathy with Medtronic CRT-P.  - LHC (2015): Nonobstructive CAD.  - Echo (2017): EF 30-35% - Echo (4/20): EF 35-40%, moderate LVH, mildly increased RV size with normal systolic function.  9. Complete heart block: MDT CRT-P device.   Social History   Socioeconomic History  . Marital status: Married    Spouse name: Not on file  . Number of children: Not on file  . Years of education: Not on file  . Highest education level: Not on file  Occupational History  . Not on file  Tobacco Use  . Smoking status: Former Smoker    Packs/day: 1.00    Years: 40.00    Pack years: 40.00    Types: Cigarettes    Quit date: 08/21/2012    Years since quitting: 7.1  . Smokeless  tobacco: Never Used  Substance and Sexual Activity  . Alcohol use: Not Currently    Alcohol/week: 0.0 standard drinks    Comment: occasional  . Drug use: No  . Sexual activity: Not on file  Other Topics Concern  . Not on file  Social History Narrative   Lives at [place] since 02/06/16   Married -[name]   Former smoker -stopped 2013   No Advance Directives   Social Determinants of Radio broadcast assistant Strain:   . Difficulty of Paying Living Expenses: Not on file  Food Insecurity: No  Food Insecurity  . Worried About Charity fundraiser in the Last Year: Never true  . Ran Out of Food in the Last Year: Never true  Transportation Needs: No Transportation Needs  . Lack of Transportation (Medical): No  . Lack of Transportation (Non-Medical): No  Physical Activity:   . Days of Exercise per Week: Not on file  . Minutes of Exercise per Session: Not on file  Stress:   . Feeling of Stress : Not on file  Social Connections:   . Frequency of Communication with Friends and Family: Not on file  . Frequency of Social Gatherings with Friends and Family: Not on file  . Attends Religious Services: Not on file  . Active Member of Clubs or Organizations: Not on file  . Attends Archivist Meetings: Not on file  . Marital Status: Not on file  Intimate Partner Violence:   . Fear of Current or Ex-Partner: Not on file  . Emotionally Abused: Not on file  . Physically Abused: Not on file  . Sexually Abused: Not on file   Family History  Problem Relation Age of Onset  . Diabetes Mother   . Alcohol abuse Mother   . Alcoholism Father   . Heart Problems Maternal Grandmother    ROS: All systems reviewed and negative except as per HPI.   Current Outpatient Medications  Medication Sig Dispense Refill  . Accu-Chek Softclix Lancets lancets Use to test blood sugars 1-2 times daily. 100 each 12  . Blood Glucose Monitoring Suppl (ACCU-CHEK AVIVA) device Use to test blood sugars 1-2 times daily. 1 each 0  . colchicine 0.6 MG tablet Take 1 tablet (0.6 mg total) by mouth daily. 30 tablet 0  . gabapentin (NEURONTIN) 300 MG capsule Take 1 capsule (300 mg total) by mouth 3 (three) times daily. 270 capsule 1  . glucose blood (ACCU-CHEK AVIVA PLUS) test strip Use to test blood sugars 1-2 times daily. 100 each 12  . insulin NPH Human (NOVOLIN N) 100 UNIT/ML injection Inject 4-8 Units into the skin See admin instructions. If blood glucose is over 120, inject 7-8 units. If blood glucose is below  120, inject 4-5 units.    . insulin regular (NOVOLIN R) 100 units/mL injection Use with sliding scale. 10 mL 11  . isosorbide-hydrALAZINE (BIDIL) 20-37.5 MG tablet Take 0.5 tablets by mouth 3 (three) times daily. 45 tablet 6  . potassium chloride SA (KLOR-CON) 20 MEQ tablet Take 3 tablets (60 mEq total) by mouth 2 (two) times daily. Please cancel all previous orders for current medication. Change in dosage or pill size. 180 tablet 1  . rosuvastatin (CRESTOR) 10 MG tablet Take 1 tablet (10 mg total) by mouth daily. 90 tablet 1  . torsemide (DEMADEX) 20 MG tablet Take 2 tablets by mouth every morning and 1 tablet every evening 60 tablet 6  . traZODone (DESYREL) 50 MG tablet Take 2  tablets (100 mg total) by mouth at bedtime as needed for sleep. 90 tablet 1  . warfarin (COUMADIN) 10 MG tablet Take 1 tablet (10 mg total) by mouth daily. Take one tablet (10mg ) every day EXCEPT Saturday, take 1/2 tablet (5mg ), continue as directed by the coumadin clinic 30 tablet 6   No current facility-administered medications for this encounter.   BP (!) 153/83   Pulse 100   SpO2 92%  PHYSICAL EXAM: General:  Obese, in wheelchair. No respiratory difficulty HEENT: normal Neck: supple. no JVD. Carotids 2+ bilat; no bruits. No lymphadenopathy or thyromegaly appreciated. Cor: PMI nondisplaced. irregularly irregular rhythm, tachy rate. No rubs, gallops or murmurs. Lungs: clear Abdomen: obese, soft, nontender, nondistended. No hepatosplenomegaly. No bruits or masses. Good bowel sounds. Extremities: no cyanosis, clubbing, rash, trace bilateral LE edema Neuro: alert & oriented x 3, cranial nerves grossly intact. moves all 4 extremities w/o difficulty. Affect pleasant.    Assessment/Plan:  1. Chronic systolic CHF: Nonischemic cardiomyopathy by cath in 2015.  Last echo in 4/20 with EF 35-40%, moderate LVH.  He has a Medtronic CRT-P device. He is minimally active but does not have much dyspnea, NYHA class III probably.   - volume status improved after diuretic dose adjustment based on optivol report. Euvolemic - continue torsemide 40 mg daily. Check BMP today. - Resume Coreg 25 mg bid (ran out several days ago, BP and HR increased) - Continue Bidil 1/2 tab tid.  2. OSA: Needs to get on CPAP, has diagnosed OSA.  Will refer to Dr. Radford Pax for sleep medicine evaluation.  3. Complete heart block: MDT CRT-P device. S/p recent gen change 08/19/19 for ERI. Also found to have chronically elevated LV lead threshold. On 11/12, he underwent gen change. Dr. Rayann Heman attempted pacemaker system revision but with failed LV lead placement and left bundle pacing attempts. Unable to revise. Had gen change out only. Has f/u w/ EP 2/18.  4. Atrial fibrillation: Permanent. V Rates have been elevated in the setting of missed doses of Coreg (ran out of refills). Will resume 25 mg bid. He is on warfarin. [facility]Rs [facility] 5. CKD Stage 3: BMET today to reassess after recent diuretic change.  6. Ascending aortic aneurysm: Per Dr. Claris Gladden recommendations, will arrange noncontrast CT of chest to further evaluate.    F/u in 2 wks to reassess BP and HR. F/u w/ Dr. Aundra Dubin in 2-3 months.      Lyda Jester PA-C 10/12/2019

## 2019-10-12 NOTE — Progress Notes (Signed)
Established Patient Office Visit  Subjective:  Patient ID: Kurt Cross, male    DOB: Aug 05, 1951  Age: 69 y.o. MRN: [id number]  CC:  Chief Complaint  Patient presents with  . Follow-up    routine check up, paperwork filled out for power wheelchair. Pt also states that his left hand and both feet have been swelling sometimes.     HPI Kurt Cross presents for evaluation for power wheelchair.  Patient is debilitated totally nonambulatory and has pressure ulcers on account of these issues.  He has been working with and evaluated by physical therapy but remains debilitated.  He is requesting refills on his Crestor and his gabapentin.  Gabapentin has been effective for his neuropathy affecting his hands and legs.  Past Medical History:  Diagnosis Date  . AAA (abdominal aortic aneurysm)/ 4.5 cm ascending per CT angio 08/01/13 08/12/2013  . Abscess 02/13/2018  . Acute on chronic renal failure (HCC) 01/27/2016   02/15/16 Na 130, K 4.6, Bun 47, creat 2.07    . Acute on chronic respiratory failure ([place]) 01/27/2016  . Acute on chronic respiratory failure with hypoxia ([place]) 01/27/2016   02/14/16 CXR no acute pulmonary abnormality, a nodule density noted at the right upper lobe, could represent a granuloma versus neoplastic lesion, may CT   . Acute renal failure (ARF) ([place]) 08/15/2013  . AKI (acute kidney injury) ([place]) 11/12/2013  . Atrial fibrillation ([place])   . Atrial flutter ([place]) 02/22/2016  . Benign hypertension   . Bradycardia   . BRONCHITIS, CHRONIC 09/01/2007   Qualifier: Diagnosis of  By: Doy Mince LPN, Megan    . Cellulitis of left lower extremity 02/14/2018  . CHF (congestive heart failure), NYHA class II ([place])   . Chronic anticoagulation 02/22/2016  . Chronic combined systolic and diastolic CHF (congestive heart failure) ([place]) 08/04/2013   02/14/16 CXR no acute pulmonary abnormality, a nodule density noted at the right upper lobe, could represent a granuloma versus neoplastic lesion, may CT     . Chronic respiratory failure ([place]) 02/10/2016  . Chronic ulcer of left heel ([place]) 02/17/2018  . CKD (chronic kidney disease), stage III 02/17/2018  . Complete heart block (HCC)    s/p PPM Bi-Ven  . Constipation, chronic 08/03/2013  . Debility 02/10/2016  . Decubitus ulcer of sacral region, stage 3 ([place]) 02/14/2018  . Diabetes mellitus, type 2 ([place])   . H/O medication noncompliance 02/22/2016  . Hernia of abdominal cavity   . Hyperkalemia 11/12/2013  . Hyperlipidemia   . Hypertension   . Hypokalemia   . Hypoxemia   . Hypoxia 01/26/2016  . 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 Kidney failure   . Neuropathy   . Obesity, Class III, BMI 40-49.9 (morbid obesity) ([place]) 09/01/2007   Qualifier: Diagnosis of  By: Doy Mince LPN, Megan    . Obstructive sleep apnea 09/01/2007   Qualifier: Diagnosis of  By: Doy Mince LPN, Megan    . OSA (obstructive sleep apnea)   . Recurrent ventral incisional hernia 08/14/2013  . Right leg pain 08/11/2013  . S/P placement of cardiac pacemaker 02/22/2016  . Sepsis ([place]) 08/02/2013  . Short gut syndrome due to distal jejunal SB fistula 10/22/2013    Past Surgical History:  Procedure Laterality Date  . APPENDECTOMY    . biventricular pacemaker generator change  04/29/2014   MDT Viva CRT-P BiV pacemaker implanted by Dr Rolla Etienne in  [place]  . biventricular pacemaker implantation  06/18/2007   MDT BIV pacemaker implanted by Dr Lovena Le  . BOWEL RESECTION  1980s?   ?colectomy/ostomy - Dr Elesa Hacker  . INCISION AND DRAINAGE ABSCESS N/A 08/14/2013   Procedure: INCISION AND DRAINAGE ABSCESS, mesh ;  Surgeon: [name], MD;  Location: [facility];  Service: General;  Laterality: N/A;  . [place]?   Dr. Lindon Romp - w mesh  . KNEE SURGERY Right   . LEAD REVISION/REPAIR N/A 08/19/2019   Procedure: LEAD REVISION/REPAIR;  Surgeon: [name], MD;   Location: [facility];  Service: Cardiovascular;  Laterality: N/A;  . LEFT HEART CATHETERIZATION WITH CORONARY ANGIOGRAM N/A 03/17/2014   Procedure: LEFT HEART CATHETERIZATION WITH CORONARY ANGIOGRAM;  Surgeon: [name], MD;  Location: [facility];  Service: Cardiovascular;  Laterality: N/A;  . NASAL SEPTUM SURGERY    . OSTOMY TAKE DOWN  1980s?   Dr Elesa Hacker  . PPM GENERATOR CHANGEOUT N/A 08/19/2019   Procedure: PPM GENERATOR CHANGEOUT;  Surgeon: [name], MD;  Location: [facility];  Service: Cardiovascular;  Laterality: N/A;  . TONSILLECTOMY AND ADENOIDECTOMY      Family History  Problem Relation Age of Onset  . Diabetes Mother   . Alcohol abuse Mother   . Alcoholism Father   . Heart Problems Maternal Grandmother     Social History   Socioeconomic History  . Marital status: Married    Spouse name: Not on file  . Number of children: Not on file  . Years of education: Not on file  . Highest education level: Not on file  Occupational History  . Not on file  Tobacco Use  . Smoking status: Former Smoker    Packs/day: 1.00    Years: 40.00    Pack years: 40.00    Types: Cigarettes    Quit date: 08/21/2012    Years since quitting: 7.1  . Smokeless tobacco: Never Used  Substance and Sexual Activity  . Alcohol use: Not Currently    Alcohol/week: 0.0 standard drinks    Comment: occasional  . Drug use: No  . Sexual activity: Not on file  Other Topics Concern  . Not on file  Social History Narrative   Lives at [place] since 02/06/16   Married -[name]   Former smoker -stopped 2013   No Advance Directives   Social Determinants of Radio broadcast assistant Strain:   . Difficulty of Paying Living Expenses: Not on file  Food Insecurity: No Food Insecurity  . Worried About Charity fundraiser in the Last Year: Never true  . Ran Out of Food in the Last Year: Never true  Transportation Needs: No Transportation Needs  . Lack of Transportation  (Medical): No  . Lack of Transportation (Non-Medical): No  Physical Activity:   . Days of Exercise per Week: Not on file  . Minutes of Exercise per Session: Not on file  Stress:   . Feeling of Stress : Not on file  Social Connections:   . Frequency of Communication with Friends and Family: Not on file  . Frequency of Social Gatherings with Friends and Family: Not on file  . Attends Religious Services: Not on file  . Active Member of Clubs or Organizations: Not on file  . Attends Archivist Meetings: Not on file  . Marital Status: Not on file  Intimate Partner Violence:   . Fear of Current or Ex-Partner: Not  on file  . Emotionally Abused: Not on file  . Physically Abused: Not on file  . Sexually Abused: Not on file    Outpatient Medications Prior to Visit  Medication Sig Dispense Refill  . Accu-Chek Softclix Lancets lancets Use to test blood sugars 1-2 times daily. 100 each 12  . Blood Glucose Monitoring Suppl (ACCU-CHEK AVIVA) device Use to test blood sugars 1-2 times daily. 1 each 0  . glucose blood (ACCU-CHEK AVIVA PLUS) test strip Use to test blood sugars 1-2 times daily. 100 each 12  . insulin NPH Human (NOVOLIN N) 100 UNIT/ML injection Inject 4-8 Units into the skin See admin instructions. If blood glucose is over 120, inject 7-8 units. If blood glucose is below 120, inject 4-5 units.    . insulin regular (NOVOLIN R) 100 units/mL injection Use with sliding scale. 10 mL 11  . isosorbide-hydrALAZINE (BIDIL) 20-37.5 MG tablet Take 0.5 tablets by mouth 3 (three) times daily. 45 tablet 6  . potassium chloride SA (KLOR-CON) 20 MEQ tablet Take 3 tablets (60 mEq total) by mouth 2 (two) times daily. Please cancel all previous orders for current medication. Change in dosage or pill size. 180 tablet 1  . torsemide (DEMADEX) 20 MG tablet Take 2 tablets by mouth every morning and 1 tablet every evening 60 tablet 6  . warfarin (COUMADIN) 10 MG tablet Take 1 tablet (10 mg total) by  mouth daily. Take one tablet (10mg ) every day EXCEPT Saturday, take 1/2 tablet (5mg ), continue as directed by the coumadin clinic 30 tablet 6  . carvedilol (COREG) 25 MG tablet Take 1 tablet (25 mg total) by mouth 2 (two) times daily. (Patient not taking: Reported on 10/12/2019) 180 tablet 3  . colchicine 0.6 MG tablet Take 1 tablet (0.6 mg total) by mouth daily. (Patient not taking: Reported on 10/12/2019) 30 tablet 0  . traZODone (DESYREL) 50 MG tablet Take 2 tablets (100 mg total) by mouth at bedtime as needed for sleep. (Patient not taking: Reported on 10/12/2019) 90 tablet 1  . gabapentin (NEURONTIN) 300 MG capsule Take 1 capsule (300 mg total) by mouth 3 (three) times daily. 270 capsule 1  . rosuvastatin (CRESTOR) 10 MG tablet Take 1 tablet (10 mg total) by mouth daily. (Patient not taking: Reported on 10/12/2019) 90 tablet 1   No facility-administered medications prior to visit.    No Known Allergies  ROS Review of Systems  Constitutional: Negative for chills, diaphoresis, fatigue, fever and unexpected weight change.  Respiratory: Negative.   Cardiovascular: Negative.   Gastrointestinal: Negative.   Endocrine: Negative for polyphagia and polyuria.  Genitourinary: Negative.   Musculoskeletal: Positive for gait problem.  Neurological: Positive for weakness. Negative for seizures and speech difficulty.  Hematological: Bruises/bleeds easily.  Psychiatric/Behavioral: Negative.       Objective:    Physical Exam  Constitutional: He is oriented to person, place, and time. He appears well-developed and well-nourished. No distress.  HENT:  Head: Normocephalic and atraumatic.  Right Ear: External ear normal.  Left Ear: External ear normal.  Eyes: Conjunctivae are normal. Right eye exhibits no discharge. Left eye exhibits no discharge. No scleral icterus.  Neck: No JVD present. No tracheal deviation present.  Cardiovascular: Normal rate, regular rhythm and normal heart sounds.    Pulmonary/Chest: Effort normal and breath sounds normal. No stridor.  Neurological: He is alert and oriented to person, place, and time.  Unable to stand.   Skin: Skin is warm and dry. He is not diaphoretic.  Psychiatric:  He has a normal mood and affect. His behavior is normal.    BP 138/78   Pulse 61   Temp 98.2 F (36.8 C) (Oral)   Ht 5\' 11"  (1.803 m)   SpO2 96%   BMI 40.03 kg/m  Wt Readings from Last 3 Encounters:  09/21/19 287 lb (130.2 kg)  08/20/19 279 lb 12.2 oz (126.9 kg)  07/10/19 278 lb 3.5 oz (126.2 kg)     Health Maintenance Due  Topic Date Due  . Hepatitis C Screening  [birth year]  . FOOT EXAM  05/08/1963  . OPHTHALMOLOGY EXAM  05/08/1963  . URINE MICROALBUMIN  05/08/1963  . TETANUS/TDAP  05/07/1972  . COLONOSCOPY  05/08/2003  . PNA vac Low Risk Adult (1 of 2 - PCV13) 05/07/2018    There are no preventive care reminders to display for this patient.  Lab Results  Component Value Date   TSH 1.287 02/03/2019   Lab Results  Component Value Date   WBC 12.2 (H) 08/19/2019   HGB 14.3 08/19/2019   HCT 41.2 08/19/2019   MCV 93.0 08/19/2019   PLT 242 08/19/2019   Lab Results  Component Value Date   NA 140 10/12/2019   K 4.0 10/12/2019   CO2 25 10/12/2019   GLUCOSE 120 (H) 10/12/2019   BUN 17 10/12/2019   CREATININE 1.99 (H) 10/12/2019   BILITOT 0.8 07/20/2019   ALKPHOS 26 (L) 07/20/2019   AST 17 07/20/2019   ALT 10 07/20/2019   PROT 7.5 07/20/2019   ALBUMIN 3.2 (L) 07/20/2019   CALCIUM 8.7 (L) 10/12/2019   ANIONGAP 10 10/12/2019   GFR 32.14 (L) 07/20/2019   Lab Results  Component Value Date   CHOL 111 07/20/2019   Lab Results  Component Value Date   HDL 20.70 (L) 07/20/2019   Lab Results  Component Value Date   LDLCALC 58 07/20/2019   Lab Results  Component Value Date   TRIG 160.0 (H) 07/20/2019   Lab Results  Component Value Date   CHOLHDL 5 07/20/2019   Lab Results  Component Value Date   HGBA1C 10.0 (H) 07/20/2019       Assessment & Plan:   Problem List Items Addressed This Visit      Nervous and Auditory   Neuropathy   Relevant Medications   gabapentin (NEURONTIN) 300 MG capsule     Other   Obesity, Class III, BMI 40-49.9 (morbid obesity) (HCC) (Chronic)   Mixed hyperlipidemia   Relevant Medications   rosuvastatin (CRESTOR) 10 MG tablet   Right leg pain   Debility   Dependence on wheelchair - Primary   Ambulatory dysfunction   Need for immunization against influenza   Relevant Orders   Flu Vaccine QUAD High Dose(Fluad) (Completed)      Meds ordered this encounter  Medications  . gabapentin (NEURONTIN) 300 MG capsule    Sig: Take 1 capsule (300 mg total) by mouth 3 (three) times daily.    Dispense:  270 capsule    Refill:  1  . rosuvastatin (CRESTOR) 10 MG tablet    Sig: Take 1 tablet (10 mg total) by mouth daily.    Dispense:  90 tablet    Refill:  1    Follow-up: Return in about 3 months (around 01/10/2020).   I agree with PT evaluation the patient could benefit from a power wheelchair.  BMP drawn today at the hospital was reviewed.  Refilled prescriptions for gabapentin and Crestor today. [name], MD

## 2019-10-15 ENCOUNTER — Other Ambulatory Visit: Payer: Self-pay

## 2019-10-15 NOTE — Patient Outreach (Signed)
[facility]) Care Management  [place]  10/15/2019   Kurt Cross 1951/06/04 [id number]  Subjective: Telephone call to wife [name].  She reports that patient is actually doing good right now.  She states INR has been no recent changes.  She states he has no problems with increased shortness of breath or swelling.  Discussed CHF and signs of worsening.  She verbalized understanding and denies any needs.    Objective:   Encounter Medications:  Outpatient Encounter Medications as of 10/15/2019  Medication Sig  . Accu-Chek Softclix Lancets lancets Use to test blood sugars 1-2 times daily.  . Blood Glucose Monitoring Suppl (ACCU-CHEK AVIVA) device Use to test blood sugars 1-2 times daily.  . carvedilol (COREG) 25 MG tablet Take 1 tablet (25 mg total) by mouth 2 (two) times daily. (Patient not taking: Reported on 10/12/2019)  . colchicine 0.6 MG tablet Take 1 tablet (0.6 mg total) by mouth daily. (Patient not taking: Reported on 10/12/2019)  . gabapentin (NEURONTIN) 300 MG capsule Take 1 capsule (300 mg total) by mouth 3 (three) times daily.  [name] Kitchen glucose blood (ACCU-CHEK AVIVA PLUS) test strip Use to test blood sugars 1-2 times daily.  . insulin NPH Human (NOVOLIN N) 100 UNIT/ML injection Inject 4-8 Units into the skin See admin instructions. If blood glucose is over 120, inject 7-8 units. If blood glucose is below 120, inject 4-5 units.  . insulin regular (NOVOLIN R) 100 units/mL injection Use with sliding scale.  . isosorbide-hydrALAZINE (BIDIL) 20-37.5 MG tablet Take 0.5 tablets by mouth 3 (three) times daily.  . potassium chloride SA (KLOR-CON) 20 MEQ tablet Take 3 tablets (60 mEq total) by mouth 2 (two) times daily. Please cancel all previous orders for current medication. Change in dosage or pill size.  . rosuvastatin (CRESTOR) 10 MG tablet Take 1 tablet (10 mg total) by mouth daily.  [name] Kitchen torsemide (DEMADEX) 20 MG tablet Take 2 tablets by mouth every morning and 1 tablet every  evening  . traZODone (DESYREL) 50 MG tablet Take 2 tablets (100 mg total) by mouth at bedtime as needed for sleep. (Patient not taking: Reported on 10/12/2019)  . warfarin (COUMADIN) 10 MG tablet Take 1 tablet (10 mg total) by mouth daily. Take one tablet (10mg ) every day EXCEPT Saturday, take 1/2 tablet (5mg ), continue as directed by the coumadin clinic   No facility-administered encounter medications on file as of 10/15/2019.    Functional Status:  In your present state of health, do you have any difficulty performing the following activities: 07/19/2019 07/13/2019  Hearing? N -  Vision? N -  Difficulty concentrating or making decisions? N -  Walking or climbing stairs? Y -  Comment patietn immobile presently -  Dressing or bathing? N -  Doing errands, shopping? Y Y  Comment patient needs to be transported. -  Preparing Food and eating ? Y -  Comment patient immobile -  Using the Toilet? Y -  Comment patient immobile -  In the past six months, have you accidently leaked urine? N -  Do you have problems with loss of bowel control? N -  Managing your Medications? Y -  Comment wife managing medications -  Managing your Finances? Y -  Comment wife assists -  Housekeeping or managing your Housekeeping? Y -  Comment wife assists -  Some recent data might be hidden    Fall/Depression Screening: Fall Risk  10/15/2019 07/19/2019 03/16/2019  Falls in the past year? 0 1 1  Comment - - patient states he slipped out of wheelchair approximately 3 months ago  Number falls in past yr: - 0 0  Comment - - -  Injury with Fall? - - 0  Risk Factor Category  - - -  Risk for fall due to : - History of fall(s) History of fall(s);Impaired mobility  Follow up - Falls prevention discussed Falls prevention discussed   PHQ 2/9 Scores 07/19/2019 06/28/2019 03/16/2019 11/16/2018 09/13/2016 09/04/2016  PHQ - 2 Score 0 0 0 0 0 0    Assessment: Patient continues to manage with chronic disease processes.    Plan:   [facility] CM Care Plan Problem One     Most Recent Value  Care Plan Problem One  Support Needs for Self- Health Management CHF  Role Documenting the Problem One  Care Management Telephonic Coordinator  Care Plan for Problem One  Active  [facility] Long Term Goal   Over the next 31 days, patient will demonstrate and/or verbalize understanding of self-health management for long term care of CHF.   [facility] Long Term Goal Start Date  09/23/19  Interventions for Problem One Long Term Goal  Patient doing well.  Unable to weigh but denies increased swelling or shortness of breath. Discussed notifying physician for changes.      RN CM will contact in the month of March and wife agreeable.    [name], RN, MSN [place] Management Care Management Coordinator Direct Line [phone] Cell [phone] Toll Free: [phone]  Fax: [phone]

## 2019-10-18 ENCOUNTER — Ambulatory Visit (HOSPITAL_COMMUNITY): Payer: Medicare Other

## 2019-10-19 ENCOUNTER — Other Ambulatory Visit: Payer: Self-pay | Admitting: Internal Medicine

## 2019-10-20 DIAGNOSIS — N184 Chronic kidney disease, stage 4 (severe): Secondary | ICD-10-CM | POA: Diagnosis not present

## 2019-10-20 DIAGNOSIS — E11621 Type 2 diabetes mellitus with foot ulcer: Secondary | ICD-10-CM | POA: Diagnosis not present

## 2019-10-20 DIAGNOSIS — I5020 Unspecified systolic (congestive) heart failure: Secondary | ICD-10-CM | POA: Diagnosis not present

## 2019-10-21 ENCOUNTER — Ambulatory Visit (HOSPITAL_COMMUNITY)
Admission: RE | Admit: 2019-10-21 | Discharge: 2019-10-21 | Disposition: A | Discharge status: Home / Self Care | Payer: Medicare Other | Source: Ambulatory Visit | Attending: Cardiology | Admitting: Cardiology

## 2019-10-21 ENCOUNTER — Other Ambulatory Visit: Payer: Self-pay

## 2019-10-21 DIAGNOSIS — Z87891 Personal history of nicotine dependence: Secondary | ICD-10-CM | POA: Diagnosis not present

## 2019-10-21 DIAGNOSIS — E1140 Type 2 diabetes mellitus with diabetic neuropathy, unspecified: Secondary | ICD-10-CM | POA: Diagnosis not present

## 2019-10-21 DIAGNOSIS — I712 Thoracic aortic aneurysm, without rupture: Secondary | ICD-10-CM | POA: Diagnosis not present

## 2019-10-21 DIAGNOSIS — Z993 Dependence on wheelchair: Secondary | ICD-10-CM | POA: Diagnosis not present

## 2019-10-21 DIAGNOSIS — Z794 Long term (current) use of insulin: Secondary | ICD-10-CM | POA: Diagnosis not present

## 2019-10-21 DIAGNOSIS — Z7901 Long term (current) use of anticoagulants: Secondary | ICD-10-CM | POA: Diagnosis not present

## 2019-10-21 DIAGNOSIS — K432 Incisional hernia without obstruction or gangrene: Secondary | ICD-10-CM | POA: Diagnosis not present

## 2019-10-21 DIAGNOSIS — I130 Hypertensive heart and chronic kidney disease with heart failure and stage 1 through stage 4 chronic kidney disease, or unspecified chronic kidney disease: Secondary | ICD-10-CM | POA: Diagnosis not present

## 2019-10-21 DIAGNOSIS — I5042 Chronic combined systolic (congestive) and diastolic (congestive) heart failure: Secondary | ICD-10-CM | POA: Diagnosis not present

## 2019-10-21 DIAGNOSIS — I4892 Unspecified atrial flutter: Secondary | ICD-10-CM | POA: Diagnosis not present

## 2019-10-21 DIAGNOSIS — G4733 Obstructive sleep apnea (adult) (pediatric): Secondary | ICD-10-CM | POA: Diagnosis not present

## 2019-10-21 DIAGNOSIS — E1122 Type 2 diabetes mellitus with diabetic chronic kidney disease: Secondary | ICD-10-CM | POA: Diagnosis not present

## 2019-10-21 DIAGNOSIS — J9611 Chronic respiratory failure with hypoxia: Secondary | ICD-10-CM | POA: Diagnosis not present

## 2019-10-21 DIAGNOSIS — I4891 Unspecified atrial fibrillation: Secondary | ICD-10-CM | POA: Diagnosis not present

## 2019-10-21 DIAGNOSIS — E785 Hyperlipidemia, unspecified: Secondary | ICD-10-CM | POA: Diagnosis not present

## 2019-10-21 DIAGNOSIS — M109 Gout, unspecified: Secondary | ICD-10-CM | POA: Diagnosis not present

## 2019-10-21 DIAGNOSIS — Z950 Presence of cardiac pacemaker: Secondary | ICD-10-CM | POA: Diagnosis not present

## 2019-10-21 DIAGNOSIS — Z9181 History of falling: Secondary | ICD-10-CM | POA: Diagnosis not present

## 2019-10-21 DIAGNOSIS — N184 Chronic kidney disease, stage 4 (severe): Secondary | ICD-10-CM | POA: Diagnosis not present

## 2019-10-21 NOTE — Telephone Encounter (Addendum)
Patient states he got new insurance January 1st so itamar home sleep test has been resubmitted to [employer] with the new information. [facility] will contact the patient.

## 2019-10-22 ENCOUNTER — Ambulatory Visit: Payer: Self-pay

## 2019-10-22 DIAGNOSIS — R6889 Other general symptoms and signs: Secondary | ICD-10-CM | POA: Diagnosis not present

## 2019-10-22 DIAGNOSIS — Z79899 Other long term (current) drug therapy: Secondary | ICD-10-CM | POA: Diagnosis not present

## 2019-10-22 DIAGNOSIS — M545 Low back pain: Secondary | ICD-10-CM | POA: Diagnosis not present

## 2019-10-22 DIAGNOSIS — M2550 Pain in unspecified joint: Secondary | ICD-10-CM | POA: Diagnosis not present

## 2019-10-22 DIAGNOSIS — Z79891 Long term (current) use of opiate analgesic: Secondary | ICD-10-CM | POA: Diagnosis not present

## 2019-10-22 DIAGNOSIS — G629 Polyneuropathy, unspecified: Secondary | ICD-10-CM | POA: Diagnosis not present

## 2019-10-22 DIAGNOSIS — G894 Chronic pain syndrome: Secondary | ICD-10-CM | POA: Diagnosis not present

## 2019-10-25 ENCOUNTER — Telehealth: Payer: Self-pay | Admitting: Family Medicine

## 2019-10-25 NOTE — Telephone Encounter (Signed)
Patient is calling and wanted to speak to someone regarding a recent order that was put in for his wheelchair. CB [phone]

## 2019-10-26 NOTE — Telephone Encounter (Signed)
Spoke with patient let him know that I will reach out to Kurt Cross and see if she has an idea of when he will receive new wheel chair. Pt states that he has a back up that was sent to him but that isn't working well either.

## 2019-10-28 ENCOUNTER — Telehealth (HOSPITAL_COMMUNITY): Payer: Self-pay

## 2019-10-28 NOTE — Telephone Encounter (Signed)
-----   Message from [place], [place] sent at 10/26/2019  3:48 PM EST ----- CT scan of chest shows slight increase in size of aortic aneurysm, now 4.8 cm Based on guidelines, will need to f/u study q6 months to monitor for progression.  There appears to be signs of slight edema (swelling) in left breast area but suspect this is due to recent device change. Check to see if he is having any pain or tenderness in that area. Will also route to Dr. Aundra Dubin to see if he has any additional recommendations.

## 2019-10-29 NOTE — Telephone Encounter (Signed)
LATE ENTRY  10/28/19 called pt to review ct results. Pt s/o answered and asked that I call back later. This RN called back and was still experiencing issues with the number (home). Called pt mobile number and left voicemail.   Pt to refer to PCP to have mammogram per Dr. Aundra Dubin verbal order

## 2019-11-01 ENCOUNTER — Encounter: Payer: Medicare HMO | Admitting: Internal Medicine

## 2019-11-03 NOTE — Telephone Encounter (Signed)
Patient is calling back and state that he still hasn't received his wheelchair and would like a call back. CB is [phone]

## 2019-11-03 NOTE — Telephone Encounter (Signed)
Left voicemail for return call on mobile number. Home number is for faxes.

## 2019-11-04 NOTE — Telephone Encounter (Signed)
Spoke with pt who verbally understood I have reached out to [employer] with no response. I called this morning and left a message for Debbie to give me a call regarding the status of the wheelchair. Pt aware that [name] is out of the office until Feb. 1st but I will give him a call as soon as I hear back from [place]. Patient does have a back up wheelchair but states that it does not work that good either.

## 2019-11-10 ENCOUNTER — Inpatient Hospital Stay (HOSPITAL_COMMUNITY)
Admission: RE | Admit: 2019-11-10 | Discharge: 2019-11-10 | Disposition: A | Discharge status: Home / Self Care | Payer: Medicare Other | Source: Ambulatory Visit

## 2019-11-10 NOTE — Telephone Encounter (Signed)
Spoke with patient who verbally understood that I spoke with [name] today and she informed me that patient's [employer] was no longer active so she had to send in new insurance information today. Per [name] she will give me a call when she hears back from them on the status of the chair. Patient aware that I will give him a call when I hear back from [place].

## 2019-11-10 NOTE — Telephone Encounter (Signed)
Patient is calling back regarding wheelchair. Patient states that he can't get around and make it to his doctors appointment without a chair. CB is [phone]

## 2019-11-12 ENCOUNTER — Encounter (HOSPITAL_COMMUNITY): Payer: Self-pay

## 2019-11-12 ENCOUNTER — Telehealth (HOSPITAL_COMMUNITY): Payer: Self-pay

## 2019-11-12 NOTE — Telephone Encounter (Signed)
-----   Message from [place], [place] sent at 10/28/2019  1:29 PM EST ----- [place], please notify pt of abnormal findings on CT scan. It shows some edema in left breast that needs to be further examined to r/o possibility of breast cancer. Dr. Aundra Dubin has reviewed study and recommended mammogram. Please order and advise that he also f/u with his PCP. I will also route study report to his PCP for notification.

## 2019-11-12 NOTE — Telephone Encounter (Signed)
Spoke with the patient about results. Pt verbalized understanding. Pt stated that his legs have some swelling. Pt unable to stand on scale for a while. Reviewed med list with pt. Pt not taking torsemide as prescribed. Asked pt to take meds as prescribed and to report back if the symptoms have not improved. PT at patient's house said vitals stable.

## 2019-11-15 ENCOUNTER — Telehealth: Payer: Self-pay | Admitting: *Deleted

## 2019-11-15 NOTE — Telephone Encounter (Signed)
Fax came stating [facility] has not been able to reach the patient to schedule his home sleep study. Reached out to the patient on his cell phone and left a message to call [facility] at [phone].

## 2019-11-22 ENCOUNTER — Telehealth: Payer: Self-pay | Admitting: Internal Medicine

## 2019-11-22 NOTE — Telephone Encounter (Signed)
This needs to be handled by pcp. Message was fwd to pcp

## 2019-11-22 NOTE — Telephone Encounter (Signed)
New message    Pt said that he has been trying all year to get awheelchair. He asked if there was a way our office could help him.

## 2019-11-22 NOTE — Telephone Encounter (Signed)
Can we help with this? Prescription for wheelchair.

## 2019-11-23 ENCOUNTER — Other Ambulatory Visit: Payer: Self-pay

## 2019-11-23 ENCOUNTER — Telehealth (INDEPENDENT_AMBULATORY_CARE_PROVIDER_SITE_OTHER): Payer: Medicare (Managed Care) | Admitting: Internal Medicine

## 2019-11-23 DIAGNOSIS — Z5329 Procedure and treatment not carried out because of patient's decision for other reasons: Secondary | ICD-10-CM

## 2019-11-23 NOTE — Progress Notes (Signed)
No show for visit. Will need to reschedule

## 2019-11-23 NOTE — Telephone Encounter (Signed)
WK-According to the message thread the wheelchair has to be ordered from you/is it considered DME?

## 2019-11-23 NOTE — Telephone Encounter (Signed)
Wheel chair has been ordered.

## 2019-11-25 ENCOUNTER — Encounter: Payer: Medicare HMO | Admitting: Internal Medicine

## 2019-11-26 ENCOUNTER — Telehealth: Payer: Self-pay | Admitting: Family Medicine

## 2019-12-03 ENCOUNTER — Other Ambulatory Visit: Payer: Self-pay

## 2019-12-03 NOTE — Patient Outreach (Signed)
[facility]) Care Management  12/03/2019  Kurt Cross [birth year] [id number]   Upon reviewing chart. Patient found to be deceased.  RN CM closed case.   [name], RN, MSN [place] Management Care Management Coordinator Direct Line [phone] Cell [phone] Toll Free: [phone]  Fax: [phone]

## 2019-12-03 NOTE — Addendum Note (Signed)
Encounter addended by: Orma Render, RPH-CPP on: 12/03/2019 10:05 AM  Actions taken: Delete clinical note

## 2019-12-06 NOTE — Telephone Encounter (Signed)
Received call that pt found deceased at home per EMS- family noted deceased at 4:45 pm.  Will forward to PCP for death certificate completion when available

## 2019-12-06 DEATH — deceased

## 2019-12-17 ENCOUNTER — Ambulatory Visit: Payer: Self-pay

## 2020-01-11 ENCOUNTER — Encounter (HOSPITAL_COMMUNITY): Payer: Medicare Other | Admitting: Cardiology
